# Patient Record
Sex: Female | Born: 1940 | Race: Black or African American | Hispanic: No | Smoking: Former smoker
Health system: Southern US, Community
[De-identification: ages and names within clinical notes are randomized; demographics above are authoritative.]

## PROBLEM LIST (undated history)

## (undated) DIAGNOSIS — G4730 Sleep apnea, unspecified: Secondary | ICD-10-CM

## (undated) DIAGNOSIS — E785 Hyperlipidemia, unspecified: Secondary | ICD-10-CM

## (undated) DIAGNOSIS — I214 Non-ST elevation (NSTEMI) myocardial infarction: Secondary | ICD-10-CM

## (undated) DIAGNOSIS — R0600 Dyspnea, unspecified: Secondary | ICD-10-CM

## (undated) DIAGNOSIS — I4891 Unspecified atrial fibrillation: Secondary | ICD-10-CM

## (undated) DIAGNOSIS — T8859XA Other complications of anesthesia, initial encounter: Secondary | ICD-10-CM

## (undated) DIAGNOSIS — T4145XA Adverse effect of unspecified anesthetic, initial encounter: Secondary | ICD-10-CM

## (undated) DIAGNOSIS — I2510 Atherosclerotic heart disease of native coronary artery without angina pectoris: Secondary | ICD-10-CM

## (undated) DIAGNOSIS — R011 Cardiac murmur, unspecified: Secondary | ICD-10-CM

## (undated) DIAGNOSIS — T7840XA Allergy, unspecified, initial encounter: Secondary | ICD-10-CM

## (undated) DIAGNOSIS — M1990 Unspecified osteoarthritis, unspecified site: Secondary | ICD-10-CM

## (undated) DIAGNOSIS — K635 Polyp of colon: Secondary | ICD-10-CM

## (undated) DIAGNOSIS — Z9289 Personal history of other medical treatment: Secondary | ICD-10-CM

## (undated) DIAGNOSIS — Z9889 Other specified postprocedural states: Secondary | ICD-10-CM

## (undated) DIAGNOSIS — R112 Nausea with vomiting, unspecified: Secondary | ICD-10-CM

## (undated) DIAGNOSIS — J189 Pneumonia, unspecified organism: Secondary | ICD-10-CM

## (undated) DIAGNOSIS — I499 Cardiac arrhythmia, unspecified: Secondary | ICD-10-CM

## (undated) DIAGNOSIS — I10 Essential (primary) hypertension: Secondary | ICD-10-CM

## (undated) DIAGNOSIS — Z87442 Personal history of urinary calculi: Secondary | ICD-10-CM

## (undated) DIAGNOSIS — K219 Gastro-esophageal reflux disease without esophagitis: Secondary | ICD-10-CM

## (undated) DIAGNOSIS — I509 Heart failure, unspecified: Secondary | ICD-10-CM

## (undated) DIAGNOSIS — Z950 Presence of cardiac pacemaker: Secondary | ICD-10-CM

## (undated) HISTORY — DX: Essential (primary) hypertension: I10

## (undated) HISTORY — DX: Unspecified osteoarthritis, unspecified site: M19.90

## (undated) HISTORY — DX: Allergy, unspecified, initial encounter: T78.40XA

## (undated) HISTORY — DX: Polyp of colon: K63.5

## (undated) HISTORY — PX: COLONOSCOPY: SHX174

## (undated) HISTORY — PX: EYE SURGERY: SHX253

## (undated) HISTORY — PX: CARDIAC CATHETERIZATION: SHX172

## (undated) HISTORY — DX: Hyperlipidemia, unspecified: E78.5

## (undated) HISTORY — DX: Atherosclerotic heart disease of native coronary artery without angina pectoris: I25.10

## (undated) HISTORY — DX: Personal history of other medical treatment: Z92.89

---
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
## 1969-11-04 HISTORY — PX: VAGINAL HYSTERECTOMY: SUR661

## 1998-11-04 HISTORY — PX: OTHER SURGICAL HISTORY: SHX169

## 1998-11-04 HISTORY — PX: CORONARY STENT PLACEMENT: SHX1402

## 1998-12-27 ENCOUNTER — Other Ambulatory Visit: Admission: RE | Admit: 1998-12-27 | Discharge: 1998-12-27 | Payer: Self-pay | Admitting: Obstetrics and Gynecology

## 1999-09-07 ENCOUNTER — Inpatient Hospital Stay (HOSPITAL_COMMUNITY): Admission: AD | Admit: 1999-09-07 | Discharge: 1999-09-08 | Payer: Self-pay | Admitting: Cardiovascular Disease

## 2002-07-28 ENCOUNTER — Other Ambulatory Visit: Admission: RE | Admit: 2002-07-28 | Discharge: 2002-07-28 | Payer: Self-pay | Admitting: Obstetrics and Gynecology

## 2004-12-18 ENCOUNTER — Ambulatory Visit: Payer: Self-pay | Admitting: Family Medicine

## 2005-05-10 ENCOUNTER — Ambulatory Visit: Payer: Self-pay | Admitting: Family Medicine

## 2005-07-26 ENCOUNTER — Ambulatory Visit: Payer: Self-pay | Admitting: Family Medicine

## 2005-09-17 ENCOUNTER — Ambulatory Visit: Payer: Self-pay | Admitting: Family Medicine

## 2005-12-17 ENCOUNTER — Ambulatory Visit: Payer: Self-pay | Admitting: Family Medicine

## 2007-04-06 ENCOUNTER — Encounter: Payer: Self-pay | Admitting: Family Medicine

## 2007-04-06 ENCOUNTER — Ambulatory Visit: Payer: Self-pay | Admitting: Family Medicine

## 2007-04-06 DIAGNOSIS — E785 Hyperlipidemia, unspecified: Secondary | ICD-10-CM | POA: Insufficient documentation

## 2007-04-06 DIAGNOSIS — I2510 Atherosclerotic heart disease of native coronary artery without angina pectoris: Secondary | ICD-10-CM | POA: Insufficient documentation

## 2007-04-06 DIAGNOSIS — I10 Essential (primary) hypertension: Secondary | ICD-10-CM | POA: Insufficient documentation

## 2007-04-06 DIAGNOSIS — Z860100 Personal history of colon polyps, unspecified: Secondary | ICD-10-CM | POA: Insufficient documentation

## 2007-04-06 DIAGNOSIS — J309 Allergic rhinitis, unspecified: Secondary | ICD-10-CM | POA: Insufficient documentation

## 2007-08-06 ENCOUNTER — Encounter: Payer: Self-pay | Admitting: Family Medicine

## 2007-11-11 ENCOUNTER — Encounter: Payer: Self-pay | Admitting: Family Medicine

## 2008-05-11 ENCOUNTER — Telehealth: Payer: Self-pay | Admitting: Family Medicine

## 2008-05-24 ENCOUNTER — Encounter: Payer: Self-pay | Admitting: Family Medicine

## 2008-05-31 ENCOUNTER — Ambulatory Visit: Payer: Self-pay | Admitting: Family Medicine

## 2008-05-31 DIAGNOSIS — M1990 Unspecified osteoarthritis, unspecified site: Secondary | ICD-10-CM | POA: Insufficient documentation

## 2008-05-31 DIAGNOSIS — M76899 Other specified enthesopathies of unspecified lower limb, excluding foot: Secondary | ICD-10-CM | POA: Insufficient documentation

## 2008-08-09 ENCOUNTER — Encounter: Payer: Self-pay | Admitting: Family Medicine

## 2008-08-31 ENCOUNTER — Ambulatory Visit: Payer: Self-pay | Admitting: Family Medicine

## 2008-08-31 DIAGNOSIS — M26629 Arthralgia of temporomandibular joint, unspecified side: Secondary | ICD-10-CM | POA: Insufficient documentation

## 2008-10-05 ENCOUNTER — Encounter: Payer: Self-pay | Admitting: Family Medicine

## 2008-10-14 ENCOUNTER — Encounter: Payer: Self-pay | Admitting: Family Medicine

## 2008-12-28 ENCOUNTER — Encounter: Payer: Self-pay | Admitting: Family Medicine

## 2009-01-21 ENCOUNTER — Ambulatory Visit: Payer: Self-pay | Admitting: Family Medicine

## 2009-01-21 DIAGNOSIS — R42 Dizziness and giddiness: Secondary | ICD-10-CM | POA: Insufficient documentation

## 2009-04-12 ENCOUNTER — Encounter: Payer: Self-pay | Admitting: Family Medicine

## 2009-08-10 ENCOUNTER — Encounter: Payer: Self-pay | Admitting: Family Medicine

## 2009-10-10 ENCOUNTER — Ambulatory Visit: Payer: Self-pay | Admitting: Family Medicine

## 2009-10-16 LAB — CONVERTED CEMR LAB
ALT: 20 units/L (ref 0–35)
AST: 27 units/L (ref 0–37)
Albumin: 4.2 g/dL (ref 3.5–5.2)
Alkaline Phosphatase: 79 units/L (ref 39–117)
BUN: 11 mg/dL (ref 6–23)
Basophils Absolute: 0 10*3/uL (ref 0.0–0.1)
Basophils Relative: 0.5 % (ref 0.0–3.0)
Bilirubin, Direct: 0.1 mg/dL (ref 0.0–0.3)
CO2: 34 meq/L — ABNORMAL HIGH (ref 19–32)
Calcium: 9.9 mg/dL (ref 8.4–10.5)
Chloride: 100 meq/L (ref 96–112)
Cholesterol: 125 mg/dL (ref 0–200)
Creatinine, Ser: 0.6 mg/dL (ref 0.4–1.2)
Eosinophils Absolute: 0.2 10*3/uL (ref 0.0–0.7)
Eosinophils Relative: 4.1 % (ref 0.0–5.0)
GFR calc non Af Amer: 126.89 mL/min (ref >60)
Glucose, Bld: 71 mg/dL (ref 70–99)
HCT: 43.8 % (ref 36.0–46.0)
HDL: 51.3 mg/dL (ref >39.00)
Hemoglobin: 14.3 g/dL (ref 12.0–15.0)
LDL Cholesterol: 59 mg/dL (ref 0–99)
Lymphocytes Relative: 31 % (ref 12.0–46.0)
Lymphs Abs: 1.6 10*3/uL (ref 0.7–4.0)
MCHC: 32.7 g/dL (ref 30.0–36.0)
MCV: 90.8 fL (ref 78.0–100.0)
Monocytes Absolute: 0.4 10*3/uL (ref 0.1–1.0)
Monocytes Relative: 7.7 % (ref 3.0–12.0)
Neutro Abs: 3 10*3/uL (ref 1.4–7.7)
Neutrophils Relative %: 56.7 % (ref 43.0–77.0)
Platelets: 173 10*3/uL (ref 150.0–400.0)
Potassium: 4.1 meq/L (ref 3.5–5.1)
RBC: 4.83 M/uL (ref 3.87–5.11)
RDW: 12 % (ref 11.5–14.6)
Sodium: 142 meq/L (ref 135–145)
TSH: 1.42 microintl units/mL (ref 0.35–5.50)
Total Bilirubin: 0.8 mg/dL (ref 0.3–1.2)
Total CHOL/HDL Ratio: 2
Total Protein: 7.8 g/dL (ref 6.0–8.3)
Triglycerides: 76 mg/dL (ref 0.0–149.0)
VLDL: 15.2 mg/dL (ref 0.0–40.0)
WBC: 5.2 10*3/uL (ref 4.5–10.5)

## 2010-05-03 ENCOUNTER — Ambulatory Visit: Payer: Self-pay | Admitting: Internal Medicine

## 2010-05-03 DIAGNOSIS — M545 Low back pain, unspecified: Secondary | ICD-10-CM | POA: Insufficient documentation

## 2010-08-07 ENCOUNTER — Encounter: Payer: Self-pay | Admitting: Family Medicine

## 2010-08-13 ENCOUNTER — Encounter: Payer: Self-pay | Admitting: Family Medicine

## 2010-10-17 ENCOUNTER — Ambulatory Visit: Payer: Self-pay | Admitting: Family Medicine

## 2010-10-17 LAB — CONVERTED CEMR LAB
Bilirubin Urine: NEGATIVE
Blood in Urine, dipstick: NEGATIVE
Glucose, Urine, Semiquant: NEGATIVE
Ketones, urine, test strip: NEGATIVE
Nitrite: NEGATIVE
Protein, U semiquant: NEGATIVE
Specific Gravity, Urine: 1.015
Urobilinogen, UA: 0.2
pH: 5

## 2010-10-19 ENCOUNTER — Telehealth (INDEPENDENT_AMBULATORY_CARE_PROVIDER_SITE_OTHER): Payer: Self-pay | Admitting: *Deleted

## 2010-10-22 LAB — CONVERTED CEMR LAB
ALT: 19 units/L (ref 0–35)
AST: 25 units/L (ref 0–37)
Albumin: 4.1 g/dL (ref 3.5–5.2)
Alkaline Phosphatase: 71 units/L (ref 39–117)
BUN: 15 mg/dL (ref 6–23)
Basophils Absolute: 0 10*3/uL (ref 0.0–0.1)
Basophils Relative: 0.5 % (ref 0.0–3.0)
Bilirubin, Direct: 0.1 mg/dL (ref 0.0–0.3)
CO2: 31 meq/L (ref 19–32)
Calcium: 9.9 mg/dL (ref 8.4–10.5)
Chloride: 102 meq/L (ref 96–112)
Cholesterol: 115 mg/dL (ref 0–200)
Creatinine, Ser: 0.6 mg/dL (ref 0.4–1.2)
Eosinophils Absolute: 0.2 10*3/uL (ref 0.0–0.7)
Eosinophils Relative: 3.4 % (ref 0.0–5.0)
GFR calc non Af Amer: 119.6 mL/min (ref >60.00)
Glucose, Bld: 84 mg/dL (ref 70–99)
HCT: 41.5 % (ref 36.0–46.0)
HDL: 42.4 mg/dL (ref >39.00)
Hemoglobin: 14.2 g/dL (ref 12.0–15.0)
LDL Cholesterol: 58 mg/dL (ref 0–99)
Lymphocytes Relative: 31 % (ref 12.0–46.0)
Lymphs Abs: 1.6 10*3/uL (ref 0.7–4.0)
MCHC: 34.2 g/dL (ref 30.0–36.0)
MCV: 88.8 fL (ref 78.0–100.0)
Monocytes Absolute: 0.4 10*3/uL (ref 0.1–1.0)
Monocytes Relative: 7.9 % (ref 3.0–12.0)
Neutro Abs: 3 10*3/uL (ref 1.4–7.7)
Neutrophils Relative %: 57.2 % (ref 43.0–77.0)
Platelets: 199 10*3/uL (ref 150.0–400.0)
Potassium: 3.8 meq/L (ref 3.5–5.1)
RBC: 4.67 M/uL (ref 3.87–5.11)
RDW: 12.9 % (ref 11.5–14.6)
Sodium: 140 meq/L (ref 135–145)
TSH: 1.27 microintl units/mL (ref 0.35–5.50)
Total Bilirubin: 0.6 mg/dL (ref 0.3–1.2)
Total CHOL/HDL Ratio: 3
Total Protein: 7 g/dL (ref 6.0–8.3)
Triglycerides: 75 mg/dL (ref 0.0–149.0)
VLDL: 15 mg/dL (ref 0.0–40.0)
WBC: 5.3 10*3/uL (ref 4.5–10.5)

## 2010-10-26 ENCOUNTER — Telehealth: Payer: Self-pay | Admitting: Family Medicine

## 2010-12-04 NOTE — Letter (Signed)
Summary: [facility]   [facility]   Imported By: Maryln Gottron 04/24/2009 13:15:49  _____________________________________________________________________  External Attachment:    Type:   Image     Comment:   External Document

## 2010-12-04 NOTE — Assessment & Plan Note (Signed)
Summary: SEVERE LOWER BACK PAIN/AND HIPS/CJR   Vital Signs:  Patient profile:   70 year old female Temp:     98.8 degrees F oral BP sitting:   130 / 68  (left arm) Cuff size:   large  Vitals Entered By: Sid Falcon LPN (May 03, 2010 3:46 PM) CC: Severe low back pain, Back Pain Pain Assessment Patient in pain? yes     Location: lower back Intensity: 7 Type: sharp   History of Present Illness: 7/10 severity       This is a 70 year old woman who presents with Back Pain.  onset last Friday.  The patient denies fever, chills, weakness, loss of sensation, fecal incontinence, and urinary incontinence.  The pain is located in the mid low back.  The pain began at home and gradually.  The pain is made worse by flexion.  The pain is made better by acetaminophen.  Risk factors for serious underlying conditions include age >= 50 years.         The pain is made better by heat.  No reported injury.  Allergies: 1)  ! Sulfa 2)  ! Morphine 3)  ! * All Caines  Past History:  Past Medical History: Last updated: 10/10/2009 Coronary artery disease, sees Dr.Thomas  Kelly Hypertension Allergic rhinitis Osteoarthritis Colonic polyps, hx of ECHO 10-2008 showed significant LVH with normal systolic function but has diastolic dysfunction normal stress test 10-2008  Review of Systems      See HPI  Physical Exam  General:  Well-developed,well-nourished,in no acute distress; alert,appropriate and cooperative throughout examination Neck:  No deformities, masses, or tenderness noted. Lungs:  Normal respiratory effort, chest expands symmetrically. Lungs are clear to auscultation, no crackles or wheezes. Heart:  normal rate and regular rhythm.   Msk:  SLRs neg.  No spinal tenderness. Extremities:  no edema. Neurologic:  strength normal in all extremities, sensation intact to light touch, and DTRs symmetrical and normal.     Impression & Recommendations:  Problem # 1:  LUMBAGO  (ICD-724.2) nonfocal neuro exam.  Trial of NSAID with possible side effects discussed with pt. Her updated medication list for this problem includes:    Adult Aspirin Low Strength 81 Mg Tbdp (Aspirin) .Marland Kitchen... 2 by mouth once daily    Meloxicam 15 Mg Tabs (Meloxicam) ..... One by mouth once daily  Complete Medication List: 1)  Nasacort Aq 55 Mcg/act Aers (Triamcinolone acetonide(nasal)) .Marland Kitchen.. 1-2 spr once daily 2)  Amlodipine Besylate 10 Mg Tabs (Amlodipine besylate) .... Once daily 3)  Adult Aspirin Low Strength 81 Mg Tbdp (Aspirin) .... 2 by mouth once daily 4)  Crestor 10 Mg Tabs (Rosuvastatin calcium) .Marland Kitchen.. 1 tablet by mouth daily 5)  Hyzaar 100-25 Mg Tabs (Losartan potassium-hctz) .... Take 1 tab by mouth daily 6)  Furosemide 20 Mg Tabs (Furosemide) .... Take 1 tab by mouth every day 7)  Multivitamins Tabs (Multiple vitamin) .Marland Kitchen.. 1 by mouth once daily 8)  Meloxicam 15 Mg Tabs (Meloxicam) .... One by mouth once daily  Patient Instructions: 1)  Most patients (90%) with low back pain will improve with time ( 2-6 weeks). Keep active but avoid activities that are painful. Apply moist heat and/or ice to lower back several times a day.  Prescriptions: MELOXICAM 15 MG TABS (MELOXICAM) one by mouth once daily  #30 x 0   Entered and Authorized by:   Evelena Peat MD   Signed by:   Evelena Peat MD on 05/03/2010   Method used:  Electronically to        [facility]  [phone]* (retail)       [address]       [place], [place]  [address]       Ph: [phone] or [phone]       Fax: [phone]   RxID:   [id number]

## 2010-12-04 NOTE — Letter (Signed)
Summary: [facility]  [facility]   Imported By: Maryln Gottron 01/04/2009 14:12:02  _____________________________________________________________________  External Attachment:    Type:   Image     Comment:   External Document

## 2010-12-04 NOTE — Progress Notes (Signed)
Summary: RF  Phone Note Call from Patient Call back at Home Phone [phone]   Caller: Patient-live call Call For: dr Nakeesha Bowler Reason for Call: Refill Medication Summary of Call: RF NASACORT AQ AT [facility] [address]. PLEASE CALL PT WHEN READY. Initial call taken by: Warnell Forester,  May 11, 2008 12:40 PM  Follow-up for Phone Call        Phone Call Completed, Rx Called In, tol pt to make ov soon Follow-up by: Alfred Levins, CMA,  May 11, 2008 1:05 PM    New/Updated Medications: NASACORT AQ 55 MCG/ACT AERS (TRIAMCINOLONE ACETONIDE(NASAL)) 1-2 spr once daily   Prescriptions: NASACORT AQ 55 MCG/ACT AERS (TRIAMCINOLONE ACETONIDE(NASAL)) 1-2 spr once daily  #30 days x 1   Entered by:   Alfred Levins, CMA   Authorized by:   [name] MD   Signed by:   Alfred Levins, CMA on 05/11/2008   Method used:   Electronically sent to ...       [facility]  [address]  [phone]*       [address]       [place], [place]  [address]       Ph: [phone] or [phone]       Fax: [phone]   RxID:   [id number]

## 2010-12-06 NOTE — Assessment & Plan Note (Signed)
Summary: CPX (PT WILL COME IN FASTING) // RS   Vital Signs:  Patient profile:   70 year old female Height:      67 inches Weight:      171 pounds BMI:     26.88 Temp:     98.5 degrees F oral Pulse rate:   60 / minute Pulse rhythm:   regular Resp:     12 per minute BP sitting:   158 / 80  Vitals Entered By: Lynann Beaver CMA AAMA (October 17, 2010 9:08 AM) CC: cpx Is Patient Diabetic? No Pain Assessment Patient in pain? no        History of Present Illness: 70 yr old female for a cpx. She feels good in general although she has had an intermittent sharp pain in the right groin for several weeks. Meloxicam helps it. Otherwise no SOB or chest pain. She had a normal stress test with Dr. Tresa Endo in October. Had an US of the aorta and femoral arteries last week, but she has not heard back from this yet.   Current Medications (verified): 1)  Nasacort Aq 55 Mcg/act Aers (Triamcinolone Acetonide(Nasal)) .Marland Kitchen.. 1-2 Spr Once Daily 2)  Amlodipine Besylate 10 Mg  Tabs (Amlodipine Besylate) .... Once Daily 3)  Adult Aspirin Low Strength 81 Mg Tbdp (Aspirin) .... 2 By Mouth Once Daily 4)  Crestor 10 Mg Tabs (Rosuvastatin Calcium) .Marland Kitchen.. 1 Tablet By Mouth Daily 5)  Hyzaar 100-25 Mg Tabs (Losartan Potassium-Hctz) .... Take 1 Tab By Mouth Daily 6)  Furosemide 20 Mg Tabs (Furosemide) .... Take 1 Tab By Mouth Every Day 7)  Multivitamins   Tabs (Multiple Vitamin) .Marland Kitchen.. 1 By Mouth Once Daily 8)  Meloxicam 15 Mg Tabs (Meloxicam) .... One By Mouth Once Daily  Allergies (verified): 1)  ! Sulfa 2)  ! Morphine 3)  ! * All Caines  Past History:  Past Medical History: Coronary artery disease, sees Dr.Thomas  Kelly Hypertension Allergic rhinitis Osteoarthritis Colonic polyps, hx of ECHO 10-2008 showed significant LVH with normal systolic function but has diastolic dysfunction normal nuclear stress test 08-07-10  Past Surgical History: Reviewed history from 10/10/2009 and no changes  required. Hysterectomy 1972 Stent placed 11/00 in LAD Colonoscopy 04-28-09 per Dr. Dorena Cookey, normal, repeat in 5 years  Family History: Reviewed history from 05/31/2008 and no changes required. Family History of CAD Female 1st degree relative <50 Ulcerative colitis sister had colectomy complete  Social History: Reviewed history from 05/31/2008 and no changes required. Retired Married Former smoker Alcohol use-no  Review of Systems  The patient denies anorexia, fever, weight loss, weight gain, vision loss, decreased hearing, hoarseness, chest pain, syncope, dyspnea on exertion, peripheral edema, prolonged cough, headaches, hemoptysis, abdominal pain, melena, hematochezia, severe indigestion/heartburn, hematuria, incontinence, genital sores, muscle weakness, suspicious skin lesions, transient blindness, difficulty walking, depression, unusual weight change, abnormal bleeding, enlarged lymph nodes, angioedema, breast masses, and testicular masses.    Physical Exam  General:  Well-developed,well-nourished,in no acute distress; alert,appropriate and cooperative throughout examination Head:  Normocephalic and atraumatic without obvious abnormalities. No apparent alopecia or balding. Eyes:  No corneal or conjunctival inflammation noted. EOMI. Perrla. Funduscopic exam benign, without hemorrhages, exudates or papilledema. Vision grossly normal. Ears:  External ear exam shows no significant lesions or deformities.  Otoscopic examination reveals clear canals, tympanic membranes are intact bilaterally without bulging, retraction, inflammation or discharge. Hearing is grossly normal bilaterally. Nose:  External nasal examination shows no deformity or inflammation. Nasal mucosa are pink and moist without lesions  or exudates. Mouth:  Oral mucosa and oropharynx without lesions or exudates.  Teeth in good repair. Neck:  No deformities, masses, or tenderness noted. Chest Wall:  No deformities, masses, or  tenderness noted. Lungs:  Normal respiratory effort, chest expands symmetrically. Lungs are clear to auscultation, no crackles or wheezes. Heart:  normal rate, regular rhythm, no gallop, no rub, and no JVD.  Has her typical 2/6 SM at the left sternal border.  Abdomen:  Bowel sounds positive,abdomen soft and non-tender without masses, organomegaly or hernias noted. Msk:  No deformity or scoliosis noted of thoracic or lumbar spine.  Mildly tender over the pelvic insertion of the right hip flexors  Pulses:  R and L carotid,radial,femoral,dorsalis pedis and posterior tibial pulses are full and equal bilaterally Extremities:  No clubbing, cyanosis, edema, or deformity noted with normal full range of motion of all joints.   Neurologic:  No cranial nerve deficits noted. Station and gait are normal. Plantar reflexes are down-going bilaterally. DTRs are symmetrical throughout. Sensory, motor and coordinative functions appear intact. Skin:  Intact without suspicious lesions or rashes Cervical Nodes:  No lymphadenopathy noted Axillary Nodes:  No palpable lymphadenopathy Inguinal Nodes:  No significant adenopathy Psych:  Cognition and judgment appear intact. Alert and cooperative with normal attention span and concentration. No apparent delusions, illusions, hallucinations   Impression & Recommendations:  Problem # 1:  OSTEOARTHRITIS (ICD-715.90)  Her updated medication list for this problem includes:    Adult Aspirin Low Strength 81 Mg Tbdp (Aspirin) .Marland Kitchen... 2 by mouth once daily    Meloxicam 15 Mg Tabs (Meloxicam) ..... One by mouth once daily  Problem # 2:  HYPERTENSION (ICD-401.9)  Her updated medication list for this problem includes:    Amlodipine Besylate 10 Mg Tabs (Amlodipine besylate) ..... Once daily    Hyzaar 100-25 Mg Tabs (Losartan potassium-hctz) .Marland Kitchen... Take 1 tab by mouth daily    Furosemide 20 Mg Tabs (Furosemide) .Marland Kitchen... Take 1 tab by mouth every day  Orders: UA Dipstick w/o Micro  (automated)  (81003) Venipuncture (04540) TLB-Lipid Panel (80061-LIPID) TLB-BMP (Basic Metabolic Panel-BMET) (80048-METABOL) TLB-CBC Platelet - w/Differential (85025-CBCD) TLB-Hepatic/Liver Function Pnl (80076-HEPATIC) TLB-TSH (Thyroid Stimulating Hormone) (84443-TSH)  Problem # 3:  HYPERLIPIDEMIA (ICD-272.4)  Her updated medication list for this problem includes:    Crestor 10 Mg Tabs (Rosuvastatin calcium) .Marland Kitchen... 1 tablet by mouth daily  Problem # 4:  CORONARY ARTERY DISEASE (ICD-414.00)  Her updated medication list for this problem includes:    Amlodipine Besylate 10 Mg Tabs (Amlodipine besylate) ..... Once daily    Adult Aspirin Low Strength 81 Mg Tbdp (Aspirin) .Marland Kitchen... 2 by mouth once daily    Hyzaar 100-25 Mg Tabs (Losartan potassium-hctz) .Marland Kitchen... Take 1 tab by mouth daily    Furosemide 20 Mg Tabs (Furosemide) .Marland Kitchen... Take 1 tab by mouth every day  Complete Medication List: 1)  Nasacort Aq 55 Mcg/act Aers (Triamcinolone acetonide(nasal)) .Marland Kitchen.. 1-2 spr once daily 2)  Amlodipine Besylate 10 Mg Tabs (Amlodipine besylate) .... Once daily 3)  Adult Aspirin Low Strength 81 Mg Tbdp (Aspirin) .... 2 by mouth once daily 4)  Crestor 10 Mg Tabs (Rosuvastatin calcium) .Marland Kitchen.. 1 tablet by mouth daily 5)  Hyzaar 100-25 Mg Tabs (Losartan potassium-hctz) .... Take 1 tab by mouth daily 6)  Furosemide 20 Mg Tabs (Furosemide) .... Take 1 tab by mouth every day 7)  Multivitamins Tabs (Multiple vitamin) .Marland Kitchen.. 1 by mouth once daily 8)  Meloxicam 15 Mg Tabs (Meloxicam) .... One by mouth once daily  Other Orders: Admin 1st Vaccine (24401) Flu Vaccine 60yrs + [phone])  Patient Instructions: 1)  get fasting labs  Prescriptions: MELOXICAM 15 MG TABS (MELOXICAM) one by mouth once daily  #30 x 11   Entered and Authorized by:   [name] MD   Signed by:   [name] MD on 10/17/2010   Method used:   Electronically to        [facility]  [phone]* (retail)       [address]        [place], [place]  [address]       Ph: [phone] or [phone]       Fax: [phone]   RxID:   [id number]    Orders Added: 1)  Est. Patient Level IV [42706] 2)  UA Dipstick w/o Micro (automated)  [81003] 3)  Venipuncture [23762] 4)  TLB-Lipid Panel [80061-LIPID] 5)  TLB-BMP (Basic Metabolic Panel-BMET) [80048-METABOL] 6)  TLB-CBC Platelet - w/Differential [85025-CBCD] 7)  TLB-Hepatic/Liver Function Pnl [80076-HEPATIC] 8)  TLB-TSH (Thyroid Stimulating Hormone) [84443-TSH] 9)  Admin 1st Vaccine [90471] 10)  Flu Vaccine 71yrs + [83151] Flu Vaccine Consent Questions     Do you have a history of severe allergic reactions to this vaccine? no    Any prior history of allergic reactions to egg and/or gelatin? no    Do you have a sensitivity to the preservative Thimersol? no    Do you have a past history of Guillan-Barre Syndrome? no    Do you currently have an acute febrile illness? no    Have you ever had a severe reaction to latex? no    Vaccine information given and explained to patient? yes    Are you currently pregnant? no    Lot Number:[id number]   Exp Date:05/04/2011   Site Given  Left Deltoid IM 8)  TLB-TSH (Thyroid Stimulating Hormone) [84443-TSH] 9)  Admin 1st Vaccine [90471] 10)  Flu Vaccine 29yrs + [76160]     .lbflu  Laboratory Results   Urine Tests    Routine Urinalysis   Color: yellow Appearance: Clear Glucose: negative   (Normal Range: Negative) Bilirubin: negative   (Normal Range: Negative) Ketone: negative   (Normal Range: Negative) Spec. Gravity: 1.015   (Normal Range: 1.003-1.035) Blood: negative   (Normal Range: Negative) pH: 5.0   (Normal Range: 5.0-8.0) Protein: negative   (Normal Range: Negative) Urobilinogen: 0.2   (Normal Range: 0-1) Nitrite: negative   (Normal Range: Negative) Leukocyte Esterace: 1+   (Normal Range: Negative)    Comments: Rita Ohara  October 17, 2010 2:46 PM

## 2010-12-06 NOTE — Progress Notes (Signed)
Summary: refill [facility] meloxicam   Phone Note From Pharmacy   Caller: [facility] Summary of Call: refill  on meloxicam Initial call taken by: Pura Spice, RN,  October 26, 2010 9:50 AM  Follow-up for Phone Call        spoke with pt stated wants meloxicam thru [facility]  [facility] notified to d/c refills on this med and rx sent to [facility] Follow-up by: Pura Spice, RN,  October 26, 2010 9:51 AM    Prescriptions: MELOXICAM 15 MG TABS (MELOXICAM) one by mouth once daily  #90 x 3   Entered by:   Pura Spice, RN   Authorized by:   [name] MD   Signed by:   Pura Spice, RN on 10/26/2010   Method used:   Faxed to ...       [facility] (mail-order)             , [place]         Ph: [phone]       Fax: [phone]   RxID:   [id number]

## 2010-12-06 NOTE — Progress Notes (Signed)
Summary: rtc  Phone Note Call from Patient Call back at Home Phone [phone]   Caller: Patient Call For: [name] MD Summary of Call: pt is return deb call Initial call taken by: Heron Sabins,  October 19, 2010 4:43 PM  Follow-up for Phone Call        spoke with pt copy labs mailed to pt at her request.  Follow-up by: Pura Spice, RN,  October 22, 2010 8:34 AM

## 2010-12-21 ENCOUNTER — Encounter: Payer: Self-pay | Admitting: Family Medicine

## 2010-12-21 ENCOUNTER — Ambulatory Visit (INDEPENDENT_AMBULATORY_CARE_PROVIDER_SITE_OTHER): Payer: Medicare Other | Admitting: Family Medicine

## 2010-12-21 VITALS — BP 120/76 | HR 75 | Temp 99.0°F | Wt 170.0 lb

## 2010-12-21 DIAGNOSIS — L0291 Cutaneous abscess, unspecified: Secondary | ICD-10-CM

## 2010-12-21 DIAGNOSIS — L0390 Cellulitis, unspecified: Secondary | ICD-10-CM

## 2010-12-21 MED ORDER — CEFTRIAXONE SODIUM 500 MG IJ SOLR
500.0000 mg | INTRAMUSCULAR | Status: DC
  Administered 2010-12-21: 500 mg via INTRAMUSCULAR

## 2010-12-21 MED ORDER — CEPHALEXIN 500 MG PO CAPS: 500.0000 mg | ORAL_CAPSULE | Freq: Three times a day (TID) | ORAL | Status: AC

## 2010-12-21 NOTE — Progress Notes (Signed)
Addended by: Madison Hickman on: 12/21/2010 04:47 PM   Modules accepted: Orders

## 2010-12-21 NOTE — Progress Notes (Signed)
  Subjective:    Patient ID: Barbara [name], female    DOB: 1941-05-16, 70 y.o.   MRN: [id number]  HPI Here for one week of swelling and tenderness around the left thumb. No hx of trauma. This began around the nail edge, but has now spread up the thumb into the radial wrist area. No fever.    Review of Systems  Constitutional: Negative.   Musculoskeletal: Positive for joint swelling.       Objective:   Physical Exam  Constitutional: She appears well-developed and well-nourished.  Musculoskeletal:       The radial side of the left thumb at the nail edge is tender, red, and swollen. She is slightly swollen along the radial side of the thumb and the wrist also. Full ROM.          Assessment & Plan:  This started as an isolated paronychia but is turning into an early cellulitis. Will treat with a Rocephin shot and oral antibiotics. Continue hot Epsom salt soaks.

## 2011-08-22 ENCOUNTER — Encounter: Payer: Self-pay | Admitting: Family Medicine

## 2011-08-29 ENCOUNTER — Other Ambulatory Visit: Payer: Self-pay | Admitting: Family Medicine

## 2011-08-29 NOTE — Telephone Encounter (Signed)
Script sent e-scribe 

## 2011-10-25 ENCOUNTER — Ambulatory Visit (INDEPENDENT_AMBULATORY_CARE_PROVIDER_SITE_OTHER): Payer: Medicare Other | Admitting: Family Medicine

## 2011-10-25 DIAGNOSIS — Z23 Encounter for immunization: Secondary | ICD-10-CM

## 2011-12-05 ENCOUNTER — Encounter: Payer: Self-pay | Admitting: Family Medicine

## 2011-12-05 ENCOUNTER — Ambulatory Visit (INDEPENDENT_AMBULATORY_CARE_PROVIDER_SITE_OTHER): Payer: Medicare Other | Admitting: Family Medicine

## 2011-12-05 VITALS — BP 138/80 | HR 61 | Temp 98.5°F | Ht 67.5 in | Wt 163.0 lb

## 2011-12-05 DIAGNOSIS — E785 Hyperlipidemia, unspecified: Secondary | ICD-10-CM

## 2011-12-05 DIAGNOSIS — K635 Polyp of colon: Secondary | ICD-10-CM

## 2011-12-05 DIAGNOSIS — I2510 Atherosclerotic heart disease of native coronary artery without angina pectoris: Secondary | ICD-10-CM

## 2011-12-05 DIAGNOSIS — Z0000 Encounter for general adult medical examination without abnormal findings: Secondary | ICD-10-CM

## 2011-12-05 DIAGNOSIS — I10 Essential (primary) hypertension: Secondary | ICD-10-CM

## 2011-12-05 DIAGNOSIS — M810 Age-related osteoporosis without current pathological fracture: Secondary | ICD-10-CM

## 2011-12-05 DIAGNOSIS — D126 Benign neoplasm of colon, unspecified: Secondary | ICD-10-CM

## 2011-12-05 LAB — LIPID PANEL
Cholesterol: 146 mg/dL (ref 0–200)
HDL: 59.8 mg/dL (ref >39.00)
LDL Cholesterol: 69 mg/dL (ref 0–99)
Total CHOL/HDL Ratio: 2
Triglycerides: 84 mg/dL (ref 0.0–149.0)
VLDL: 16.8 mg/dL (ref 0.0–40.0)

## 2011-12-05 LAB — POCT URINALYSIS DIPSTICK
Bilirubin, UA: NEGATIVE
Blood, UA: NEGATIVE
Glucose, UA: NEGATIVE
Ketones, UA: NEGATIVE
Leukocytes, UA: NEGATIVE
Nitrite, UA: NEGATIVE
Protein, UA: NEGATIVE
Spec Grav, UA: 1.025
Urobilinogen, UA: 0.2
pH, UA: 5.5

## 2011-12-05 LAB — TSH: TSH: 1.11 u[IU]/mL (ref 0.35–5.50)

## 2011-12-05 LAB — CBC WITH DIFFERENTIAL/PLATELET
Basophils Absolute: 0 10*3/uL (ref 0.0–0.1)
Basophils Relative: 0.4 % (ref 0.0–3.0)
Eosinophils Absolute: 0.2 10*3/uL (ref 0.0–0.7)
Eosinophils Relative: 2.7 % (ref 0.0–5.0)
HCT: 43.5 % (ref 36.0–46.0)
Hemoglobin: 14.7 g/dL (ref 12.0–15.0)
Lymphocytes Relative: 29.5 % (ref 12.0–46.0)
Lymphs Abs: 1.8 10*3/uL (ref 0.7–4.0)
MCHC: 33.8 g/dL (ref 30.0–36.0)
MCV: 90.1 fl (ref 78.0–100.0)
Monocytes Absolute: 0.4 10*3/uL (ref 0.1–1.0)
Monocytes Relative: 6.1 % (ref 3.0–12.0)
Neutro Abs: 3.7 10*3/uL (ref 1.4–7.7)
Neutrophils Relative %: 61.3 % (ref 43.0–77.0)
Platelets: 208 10*3/uL (ref 150.0–400.0)
RBC: 4.83 Mil/uL (ref 3.87–5.11)
RDW: 12.7 % (ref 11.5–14.6)
WBC: 6.1 10*3/uL (ref 4.5–10.5)

## 2011-12-05 LAB — HEPATIC FUNCTION PANEL
ALT: 17 U/L (ref 0–35)
AST: 21 U/L (ref 0–37)
Albumin: 4.4 g/dL (ref 3.5–5.2)
Alkaline Phosphatase: 69 U/L (ref 39–117)
Bilirubin, Direct: 0.1 mg/dL (ref 0.0–0.3)
Total Bilirubin: 0.9 mg/dL (ref 0.3–1.2)
Total Protein: 7.6 g/dL (ref 6.0–8.3)

## 2011-12-05 LAB — BASIC METABOLIC PANEL
BUN: 18 mg/dL (ref 6–23)
CO2: 31 mEq/L (ref 19–32)
Calcium: 9.8 mg/dL (ref 8.4–10.5)
Chloride: 101 mEq/L (ref 96–112)
Creatinine, Ser: 0.6 mg/dL (ref 0.4–1.2)
GFR: 117.07 mL/min (ref >60.00)
Glucose, Bld: 63 mg/dL — ABNORMAL LOW (ref 70–99)
Potassium: 3.3 mEq/L — ABNORMAL LOW (ref 3.5–5.1)
Sodium: 143 mEq/L (ref 135–145)

## 2011-12-05 NOTE — Progress Notes (Signed)
  Subjective:    Patient ID: Barbara [name], female    DOB: 1941/07/18, 71 y.o.   MRN: [id number]  HPI 71 yr old female for a cpx. She feels fine and has no concerns. No chest pains or SOB. She stays active and watches her diet. She sees Dr. Tresa Endo once a year every summer.    Review of Systems  Constitutional: Negative.   HENT: Negative.   Eyes: Negative.   Respiratory: Negative.   Cardiovascular: Negative.   Gastrointestinal: Negative.   Genitourinary: Negative for dysuria, urgency, frequency, hematuria, flank pain, decreased urine volume, enuresis, difficulty urinating, pelvic pain and dyspareunia.  Musculoskeletal: Negative.   Skin: Negative.   Neurological: Negative.   Hematological: Negative.   Psychiatric/Behavioral: Negative.        Objective:   Physical Exam  Constitutional: She is oriented to person, place, and time. She appears well-developed and well-nourished. No distress.  HENT:  Head: Normocephalic and atraumatic.  Right Ear: External ear normal.  Left Ear: External ear normal.  Nose: Nose normal.  Mouth/Throat: Oropharynx is clear and moist. No oropharyngeal exudate.  Eyes: Conjunctivae and EOM are normal. Pupils are equal, round, and reactive to light. No scleral icterus.  Neck: Normal range of motion. Neck supple. No JVD present. No thyromegaly present.  Cardiovascular: Normal rate, regular rhythm, normal heart sounds and intact distal pulses.  Exam reveals no gallop and no friction rub.   No murmur heard.      EKG is at her baseline with LVH and T wave inversions in the inferior and lateral leads   Pulmonary/Chest: Effort normal and breath sounds normal. No respiratory distress. She has no wheezes. She has no rales. She exhibits no tenderness.  Abdominal: Soft. Bowel sounds are normal. She exhibits no distension and no mass. There is no tenderness. There is no rebound and no guarding.  Musculoskeletal: Normal range of motion. She exhibits no edema and  no tenderness.  Lymphadenopathy:    She has no cervical adenopathy.  Neurological: She is alert and oriented to person, place, and time. She has normal reflexes. No cranial nerve deficit. She exhibits normal muscle tone. Coordination normal.  Skin: Skin is warm and dry. No rash noted. No erythema.  Psychiatric: She has a normal mood and affect. Her behavior is normal. Judgment and thought content normal.          Assessment & Plan:  Well exam. Get fasting labs. Set up a DEXA.

## 2011-12-09 ENCOUNTER — Ambulatory Visit (INDEPENDENT_AMBULATORY_CARE_PROVIDER_SITE_OTHER)
Admission: RE | Admit: 2011-12-09 | Discharge: 2011-12-09 | Disposition: A | Discharge status: Home / Self Care | Payer: Medicare Other | Source: Ambulatory Visit

## 2011-12-09 DIAGNOSIS — M810 Age-related osteoporosis without current pathological fracture: Secondary | ICD-10-CM

## 2011-12-10 ENCOUNTER — Telehealth: Payer: Self-pay | Admitting: Family Medicine

## 2011-12-10 NOTE — Telephone Encounter (Signed)
I spoke with pt to go over lab results. Pt does have Klor-Con 20 meq but has not been taking it. She was told to hold on that and if she did have to take it, then take a Lasix with it. She has 4 bottles of the Klor-Con. Do you want her to start back on it & if so then can she cut in half? Also do you advise taking the Lasix when she takes the Klor-Con?

## 2011-12-10 NOTE — Telephone Encounter (Signed)
First, take the 10 mEq of potassium every day. Yes, she may take one half of a 20 every day. No, she should not be taking Lasix now since she does not need it

## 2011-12-10 NOTE — Progress Notes (Signed)
Quick Note:  Spoke with pt, see phone note for 12/10/11. Also pt requested a copy of labs mailed to her. ______

## 2011-12-11 NOTE — Telephone Encounter (Signed)
Spoke with pt

## 2011-12-16 ENCOUNTER — Other Ambulatory Visit: Payer: Self-pay | Admitting: Family Medicine

## 2011-12-16 MED ORDER — POTASSIUM CHLORIDE ER 10 MEQ PO TBCR: 10.0000 meq | EXTENDED_RELEASE_TABLET | Freq: Every day | ORAL | Status: DC

## 2011-12-16 NOTE — Telephone Encounter (Signed)
Pt need 14 day supply of potassium?mg call [facility] [address] and #90 [facility] supply [facility].

## 2011-12-16 NOTE — Telephone Encounter (Signed)
I sent in a local script for Klor Con 10 meq and also a 90 day supply to [facility], see lab result note. Also spoke with pt.

## 2011-12-25 ENCOUNTER — Encounter: Payer: Self-pay | Admitting: Family Medicine

## 2011-12-25 ENCOUNTER — Ambulatory Visit (INDEPENDENT_AMBULATORY_CARE_PROVIDER_SITE_OTHER): Payer: Medicare Other | Admitting: Family Medicine

## 2011-12-25 ENCOUNTER — Telehealth: Payer: Self-pay | Admitting: Family Medicine

## 2011-12-25 VITALS — BP 138/74 | HR 77 | Temp 98.4°F | Wt 163.0 lb

## 2011-12-25 DIAGNOSIS — M5430 Sciatica, unspecified side: Secondary | ICD-10-CM

## 2011-12-25 MED ORDER — DICLOFENAC SODIUM 75 MG PO TBEC: 75.0000 mg | DELAYED_RELEASE_TABLET | Freq: Two times a day (BID) | ORAL | Status: DC | PRN

## 2011-12-25 NOTE — Telephone Encounter (Signed)
Left voice message for pt to call and schedule office visit.

## 2011-12-25 NOTE — Telephone Encounter (Signed)
The potassium would not cause her leg to be stiff. This needs to be evaluated, so make her an OV with me soon

## 2011-12-25 NOTE — Telephone Encounter (Signed)
Patient walked in stating that she is having side effects from the potassium she is taking, such as, left leg is tight, painful and stiff. Patient is also having gas and would like to know how long she is to take potassium and if she can substitute for them. Please advise and inform patient of advice.

## 2011-12-25 NOTE — Progress Notes (Signed)
  Subjective:    Patient ID: Barbara [name], female    DOB: 06/10/1941, 71 y.o.   MRN: [id number]  HPI Here for 2 weeks of pains that start in the left hip and run down the left leg to the mid calf. No numbness or weakness. No back pain. No recent trauma or falls.    Review of Systems  Constitutional: Negative.   Musculoskeletal: Positive for arthralgias.       Objective:   Physical Exam  Constitutional: She appears well-developed and well-nourished.  Cardiovascular: Intact distal pulses.   Musculoskeletal: She exhibits no edema.       Tender in the left sciatic notch and the left buttock. The spine and hips have full ROM.           Assessment & Plan:  Rest, heat, Diclofenac prn.

## 2011-12-31 ENCOUNTER — Encounter: Payer: Self-pay | Admitting: Family Medicine

## 2012-01-01 NOTE — Progress Notes (Signed)
Quick Note:  Spoke with pt ______ 

## 2012-04-17 ENCOUNTER — Telehealth: Payer: Self-pay | Admitting: Family Medicine

## 2012-04-17 NOTE — Telephone Encounter (Signed)
I spoke with pt and she is going to schedule to come in on 04/20/12.

## 2012-04-17 NOTE — Telephone Encounter (Signed)
Make an OV for next week  

## 2012-04-17 NOTE — Telephone Encounter (Signed)
Patient came in stating that since she has been on potassium her ankles have been swollen. Please advise.

## 2012-04-20 ENCOUNTER — Encounter: Payer: Self-pay | Admitting: Family Medicine

## 2012-04-20 ENCOUNTER — Ambulatory Visit (INDEPENDENT_AMBULATORY_CARE_PROVIDER_SITE_OTHER): Payer: Medicare Other | Admitting: Family Medicine

## 2012-04-20 VITALS — BP 138/62 | HR 59 | Temp 98.5°F | Wt 163.0 lb

## 2012-04-20 DIAGNOSIS — R609 Edema, unspecified: Secondary | ICD-10-CM

## 2012-04-20 DIAGNOSIS — R600 Localized edema: Secondary | ICD-10-CM

## 2012-04-20 DIAGNOSIS — I10 Essential (primary) hypertension: Secondary | ICD-10-CM

## 2012-04-20 MED ORDER — FUROSEMIDE 20 MG PO TABS: 20.0000 mg | ORAL_TABLET | Freq: Every day | ORAL | Status: DC

## 2012-04-20 NOTE — Progress Notes (Signed)
  Subjective:    Patient ID: Barbara [name], female    DOB: 03/14/1941, 71 y.o.   MRN: [id number]  HPI Here for several weeks of swelling at the feet and ankles. No discomfort involved. No SOB. She feels fine otherwise.    Review of Systems  Constitutional: Negative.   Respiratory: Negative.   Cardiovascular: Positive for leg swelling. Negative for chest pain and palpitations.       Objective:   Physical Exam  Constitutional: She appears well-developed and well-nourished.  Cardiovascular: Normal rate, regular rhythm and intact distal pulses.  Exam reveals no gallop and no friction rub.        She has her usual 2/6 SM over the RSB   Musculoskeletal:       1+ edema in both feet, not tender           Assessment & Plan:  Her weight has not changed since February. I think part of this may be side effects of her Amlodipine, probably exacerbated by the recent hot weather. Get back on Lasix every morning.

## 2012-08-11 ENCOUNTER — Telehealth: Payer: Self-pay | Admitting: Family Medicine

## 2012-08-11 MED ORDER — FUROSEMIDE 20 MG PO TABS: 20.0000 mg | ORAL_TABLET | Freq: Every day | ORAL | Status: DC

## 2012-08-11 NOTE — Telephone Encounter (Signed)
Refill request for Lasix 20 mg 90 day supply to [facility]. I did sent e-scribe.

## 2012-08-24 ENCOUNTER — Encounter: Payer: Self-pay | Admitting: Family Medicine

## 2012-09-17 ENCOUNTER — Other Ambulatory Visit (HOSPITAL_COMMUNITY): Payer: Self-pay

## 2012-09-17 ENCOUNTER — Other Ambulatory Visit (HOSPITAL_COMMUNITY): Payer: Self-pay | Admitting: *Deleted

## 2012-09-17 DIAGNOSIS — I2510 Atherosclerotic heart disease of native coronary artery without angina pectoris: Secondary | ICD-10-CM

## 2012-09-17 DIAGNOSIS — I4891 Unspecified atrial fibrillation: Secondary | ICD-10-CM

## 2012-09-23 ENCOUNTER — Ambulatory Visit (HOSPITAL_COMMUNITY)
Admission: RE | Admit: 2012-09-23 | Discharge: 2012-09-23 | Disposition: A | Discharge status: Home / Self Care | Payer: Medicare Other | Source: Ambulatory Visit | Attending: Cardiovascular Disease | Admitting: Cardiovascular Disease

## 2012-09-23 DIAGNOSIS — R0602 Shortness of breath: Secondary | ICD-10-CM | POA: Insufficient documentation

## 2012-09-23 DIAGNOSIS — I2510 Atherosclerotic heart disease of native coronary artery without angina pectoris: Secondary | ICD-10-CM

## 2012-09-23 DIAGNOSIS — R9431 Abnormal electrocardiogram [ECG] [EKG]: Secondary | ICD-10-CM | POA: Insufficient documentation

## 2012-09-23 DIAGNOSIS — I10 Essential (primary) hypertension: Secondary | ICD-10-CM | POA: Insufficient documentation

## 2012-09-23 DIAGNOSIS — I4891 Unspecified atrial fibrillation: Secondary | ICD-10-CM | POA: Insufficient documentation

## 2012-09-23 MED ORDER — TECHNETIUM TC 99M SESTAMIBI GENERIC - CARDIOLITE
10.2000 | Freq: Once | INTRAVENOUS | Status: AC | PRN
  Administered 2012-09-23: 10 via INTRAVENOUS

## 2012-09-23 MED ORDER — TECHNETIUM TC 99M SESTAMIBI GENERIC - CARDIOLITE
30.9000 | Freq: Once | INTRAVENOUS | Status: AC | PRN
  Administered 2012-09-23: 30.9 via INTRAVENOUS

## 2012-09-23 MED ORDER — AMINOPHYLLINE 25 MG/ML IV SOLN
75.0000 mg | Freq: Once | INTRAVENOUS | Status: AC
  Administered 2012-09-23: 75 mg via INTRAVENOUS

## 2012-09-23 MED ORDER — REGADENOSON 0.4 MG/5ML IV SOLN
0.4000 mg | Freq: Once | INTRAVENOUS | Status: AC
  Administered 2012-09-23: 0.4 mg via INTRAVENOUS

## 2012-09-23 NOTE — Progress Notes (Signed)
[facility]   2D echo completed 09/23/2012.   Cindy Juddson Cobern, RDCS   

## 2012-09-23 NOTE — CV Procedure (Signed)
[place]  [facility] [address]  [address]  [place] [address]  [phone]  Cardiology Nuclear Med Study  Barbara [name] is a 71 y.o. female MRN : [id number] DOB: 03-Aug-1941  Procedure Date: 09/23/2012  Nuclear Med Background  Indication for Stress Test: Evaluation for Ischemia, Stent Patency and Abnormal EKG  History: PTCA/Stent 2000  Cardiac Risk Factors: Hypertension, Lipids and Obesity  Symptoms: SOB  Nuclear Pre-Procedure  Caffeine/Decaff Intake: None  NPO After: 6:00am   Lungs: N/A  O2 Sat: N/A% on N/A.  IV 0.9% NS with Angio Cath: 22g   IV Site: R Hand  IV Started by: Bonnita Levan, RN   Chest Size (in): 42  Cup Size: C   Height: 5\' 7"  (1.702 m)  Weight: 163 lb (73.936 kg)   BMI: Body mass index is 25.53 kg/(m^2).  Tech Comments: 75 mg aminophylline iv post Lexiscan injection   Nuclear Med Study  1 or 2 day study: 1 day  Stress Test Type: Lexiscan   Order Authorizing Provider:  Daphene Jaeger, MD    Resting Radionuclide: Technetium 107m Sestamibi  Resting Radionuclide Dose: 10.2 mCi   Stress Radionuclide: Technetium 98m Sestamibi  Stress Radionuclide Dose: 30.9 mCi   Stress Protocol  Rest HR:47  Stress HR:71   Rest BP: 180/84  Stress BP: 187/84   Exercise Time (min): n/a  METS: n/a   Predicted Max HR: 147 bpm  % Max HR: 48.3 bpm  Rate Pressure Product: 21308  Dose of Adenosine (mg): n/a  Dose of Lexiscan: 0.4 mg   Dose of Atropine (mg): n/a  Dose of Dobutamine: n/a mcg/kg/min (at max HR)   Stress Test Technologist: Ernestene Mention, CCT  Nuclear Technologist: Gonzella Lex, CNMT    Rest Procedure: Myocardial perfusion imaging was performed at rest 45 minutes following the intravenous administration of Technetium 4m Sestamibi.  Stress Procedure: The patient received IV Lexiscan 0.4 mg over 15-seconds. Technetium 38m Sestamibi injected at 30-seconds. There were no significant changes with Lexiscan. Quantitative spect images  were obtained after a 45 minute delay.  Transient Ischemic Dilatation (Normal <1.22): 1.13  Lung/Heart Ratio (Normal <0.45): 0.24  QGS EDV: 77 ml  QGS ESV: 25 ml  LV Ejection Fraction: 68%   MD INTERPRETATION:  Rest ECG: Sinus Bradycardia, LVH with repolarization abnormality - Anterolateral TWI  Stress ECG: No significant change from baseline ECG  QPS Raw Data Images:  Normal LV size; increased tracer uptake in the Left Hepatic lobe obscures the inferoseptal wall -- noted in rest > stress images Stress Images:  Normal homogeneous uptake in all areas of the myocardium. Rest Images:  Comparison with the stress images reveals no significant change. Subtraction (SDS):  No reversibility is appreciated.  Impression Exercise Capacity:  Lexiscan with no exercise. BP Response:  Normal blood pressure response. Clinical Symptoms:  There is dyspnea. ECG Impression:  No significant ECG changes with Lexiscan. Comparison with Prior Nuclear Study: No significant change from previous study  Overall Impression:  Low risk stress nuclear study.  No evidence of ischemia or prior infarction.  LV Wall Motion:  NL LV Function; NL Wall Motion   HARDING,DAVID W, MD  09/23/2012 5:49 PM

## 2012-09-23 NOTE — Procedures (Signed)
[facility] [address] [place] [address] [phone]  Cardiology Nuclear Med Study  Barbara [name] is a 71 y.o. female     MRN : [id number]     DOB: [birth year]  Procedure Date: 09/23/2012  Nuclear Med Background Indication for Stress Test:  Evaluation for Ischemia, Stent Patency and Abnormal EKG History:  PTCA/Stent 2000 Cardiac Risk Factors: Hypertension, Lipids and Obesity  Symptoms:  SOB   Nuclear Pre-Procedure Caffeine/Decaff Intake:  None NPO After: 6:00am   Lungs:  N/A O2 Sat: N/A% on N/A. IV 0.9% NS with Angio Cath:  22g  IV Site: R Hand  IV Started by:  Bonnita Levan, RN  Chest Size (in):  42 Cup Size: C  Height: 5\' 7"  (1.702 m)  Weight:  163 lb (73.936 kg)  BMI:  Body mass index is 25.53 kg/(m^2). Tech Comments:  75 mg aminophylline iv post Lexiscan injection    Nuclear Med Study 1 or 2 day study: 1 day  Stress Test Type:  Lexiscan  Order Authorizing Provider:   Daphene Jaeger, MD   Resting Radionuclide: Technetium 32m Sestamibi  Resting Radionuclide Dose: 10.2 mCi   Stress Radionuclide:  Technetium 94m Sestamibi  Stress Radionuclide Dose: 30.9 mCi           Stress Protocol Rest HR:47 Stress HR:71  Rest BP: 180/84 Stress BP: 187/84  Exercise Time (min): n/a METS: n/a   Predicted Max HR: 147 bpm % Max HR: 48.3 bpm Rate Pressure Product: 21308   Dose of Adenosine (mg):  n/a Dose of Lexiscan: 0.4 mg  Dose of Atropine (mg): n/a Dose of Dobutamine: n/a mcg/kg/min (at max HR)  Stress Test Technologist: Ernestene Mention, CCT Nuclear Technologist: Gonzella Lex, CNMT   Rest Procedure:  Myocardial perfusion imaging was performed at rest 45 minutes following the intravenous administration of Technetium 82m Sestamibi. Stress Procedure:  The patient received IV Lexiscan 0.4 mg over 15-seconds.  Technetium 36m Sestamibi injected at 30-seconds.  There were no significant changes with Lexiscan.  Quantitative  spect images were obtained after a 45 minute delay.  Transient Ischemic Dilatation (Normal <1.22):  1.13 Lung/Heart Ratio (Normal <0.45):  0.24 QGS EDV:  77 ml QGS ESV:  25 ml LV Ejection Fraction: 68%

## 2012-10-17 ENCOUNTER — Other Ambulatory Visit: Payer: Self-pay | Admitting: Family Medicine

## 2012-10-21 ENCOUNTER — Ambulatory Visit (INDEPENDENT_AMBULATORY_CARE_PROVIDER_SITE_OTHER): Payer: Medicare Other | Admitting: Family Medicine

## 2012-10-21 DIAGNOSIS — Z23 Encounter for immunization: Secondary | ICD-10-CM

## 2012-11-26 ENCOUNTER — Other Ambulatory Visit: Payer: Self-pay | Admitting: Family Medicine

## 2012-11-30 ENCOUNTER — Other Ambulatory Visit (INDEPENDENT_AMBULATORY_CARE_PROVIDER_SITE_OTHER): Payer: PRIVATE HEALTH INSURANCE

## 2012-11-30 DIAGNOSIS — Z0000 Encounter for general adult medical examination without abnormal findings: Secondary | ICD-10-CM

## 2012-11-30 DIAGNOSIS — I2510 Atherosclerotic heart disease of native coronary artery without angina pectoris: Secondary | ICD-10-CM

## 2012-11-30 DIAGNOSIS — E785 Hyperlipidemia, unspecified: Secondary | ICD-10-CM

## 2012-11-30 DIAGNOSIS — I10 Essential (primary) hypertension: Secondary | ICD-10-CM

## 2012-11-30 LAB — POCT URINALYSIS DIPSTICK
Bilirubin, UA: NEGATIVE
Blood, UA: NEGATIVE
Glucose, UA: NEGATIVE
Ketones, UA: NEGATIVE
Leukocytes, UA: NEGATIVE
Nitrite, UA: NEGATIVE
Protein, UA: NEGATIVE
Spec Grav, UA: 1.025
Urobilinogen, UA: 0.2
pH, UA: 5.5

## 2012-12-04 NOTE — Progress Notes (Signed)
Quick Note:  I spoke with pt, she had the labs already done before her CPE, but not the urine. ______

## 2012-12-04 NOTE — Progress Notes (Signed)
Quick Note:  I spoke with pt, she had the labs done before the CPE but not the UA. ______

## 2012-12-07 ENCOUNTER — Ambulatory Visit (INDEPENDENT_AMBULATORY_CARE_PROVIDER_SITE_OTHER): Payer: PRIVATE HEALTH INSURANCE | Admitting: Family Medicine

## 2012-12-07 ENCOUNTER — Encounter: Payer: Self-pay | Admitting: Family Medicine

## 2012-12-07 VITALS — BP 138/70 | HR 62 | Temp 98.2°F | Ht 67.5 in | Wt 163.0 lb

## 2012-12-07 DIAGNOSIS — Z0000 Encounter for general adult medical examination without abnormal findings: Secondary | ICD-10-CM

## 2012-12-07 NOTE — Progress Notes (Signed)
  Subjective:    Patient ID: Barbara [name], female    DOB: 11/14/1940, 72 y.o.   MRN: [id number]  HPI 73 yr old female for a cpx. She feels well in general. She saw Dr. Tresa Endo in October and got a good report from him, including all her fasting labs. She had another stress test and an ECHO, all of which were unremarkable.    Review of Systems  Constitutional: Negative.   HENT: Negative.   Eyes: Negative.   Respiratory: Negative.   Cardiovascular: Negative.   Gastrointestinal: Negative.   Genitourinary: Negative for dysuria, urgency, frequency, hematuria, flank pain, decreased urine volume, enuresis, difficulty urinating, pelvic pain and dyspareunia.  Musculoskeletal: Negative.   Skin: Negative.   Neurological: Negative.   Hematological: Negative.   Psychiatric/Behavioral: Negative.        Objective:   Physical Exam  Constitutional: She is oriented to person, place, and time. She appears well-developed and well-nourished. No distress.  HENT:  Head: Normocephalic and atraumatic.  Right Ear: External ear normal.  Left Ear: External ear normal.  Nose: Nose normal.  Mouth/Throat: Oropharynx is clear and moist. No oropharyngeal exudate.  Eyes: Conjunctivae normal and EOM are normal. Pupils are equal, round, and reactive to light. No scleral icterus.  Neck: Normal range of motion. Neck supple. No JVD present. No thyromegaly present.  Cardiovascular: Normal rate, regular rhythm and intact distal pulses.  Exam reveals no gallop and no friction rub.        She has her usual 2/6 SM over the left sternal border   Pulmonary/Chest: Effort normal and breath sounds normal. No respiratory distress. She has no wheezes. She has no rales. She exhibits no tenderness.  Abdominal: Soft. Bowel sounds are normal. She exhibits no distension and no mass. There is no tenderness. There is no rebound and no guarding.  Musculoskeletal: Normal range of motion. She exhibits no edema and no tenderness.   Lymphadenopathy:    She has no cervical adenopathy.  Neurological: She is alert and oriented to person, place, and time. She has normal reflexes. No cranial nerve deficit. She exhibits normal muscle tone. Coordination normal.  Skin: Skin is warm and dry. No rash noted. No erythema.  Psychiatric: She has a normal mood and affect. Her behavior is normal. Judgment and thought content normal.          Assessment & Plan:  Well exam.

## 2013-03-01 ENCOUNTER — Encounter: Payer: Self-pay | Admitting: Cardiovascular Disease

## 2013-03-19 ENCOUNTER — Other Ambulatory Visit: Payer: Self-pay | Admitting: Family Medicine

## 2013-03-19 NOTE — Telephone Encounter (Signed)
Can we refill this? 

## 2013-03-19 NOTE — Telephone Encounter (Signed)
Okay for one year  

## 2013-06-03 ENCOUNTER — Other Ambulatory Visit: Payer: Self-pay | Admitting: Family Medicine

## 2013-06-10 ENCOUNTER — Encounter: Payer: Self-pay | Admitting: Family Medicine

## 2013-06-10 ENCOUNTER — Ambulatory Visit (INDEPENDENT_AMBULATORY_CARE_PROVIDER_SITE_OTHER): Payer: PRIVATE HEALTH INSURANCE | Admitting: Family Medicine

## 2013-06-10 VITALS — BP 128/68 | HR 56 | Temp 98.3°F | Wt 166.0 lb

## 2013-06-10 DIAGNOSIS — L989 Disorder of the skin and subcutaneous tissue, unspecified: Secondary | ICD-10-CM

## 2013-06-10 NOTE — Progress Notes (Signed)
  Subjective:    Patient ID: Barbara [name], female    DOB: 22-May-1941, 72 y.o.   MRN: [id number]  HPI Growth left anterior shoulder She noted some type of bug bite in June and noticed this growth following that. Occasional itching. No pain. No bleeding. She's tried topical creams without improvement No lymphadenopathy.  Past Medical History  Diagnosis Date  . Allergy   . Hyperlipidemia   . Hypertension   . CAD (coronary artery disease)     sees Dr. Nicki Guadalajara   . Colon polyps   . Osteoarthritis    Past Surgical History  Procedure Laterality Date  . Abdominal hysterectomy  1971  . Coronary stent placement  2000    in LAD  . Colonoscopy  04-28-09    per Dr. Dorena Cookey, clear, repeat in 5 yrs     reports that she quit smoking about 16 years ago. She has never used smokeless tobacco. She reports that she does not drink alcohol or use illicit drugs. family history is not on file. Allergies  Allergen Reactions  . Morphine   . Procaine Hcl     rash  . Sulfonamide Derivatives   . Vytorin (Ezetimibe-Simvastatin)       Review of Systems  Constitutional: Negative for appetite change and unexpected weight change.  Hematological: Negative for adenopathy.       Objective:   Physical Exam  Constitutional: She appears well-developed and well-nourished.  Cardiovascular: Normal rate and regular rhythm.   Skin:  Patient has well demarcated,symmetric,  rounded 3-4 mm nodular lesion left anterior shoulder.          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 Reassurance. She requests excision. Will reschedule with primary

## 2013-06-10 NOTE — Patient Instructions (Addendum)
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

## 2013-06-21 ENCOUNTER — Telehealth: Payer: Self-pay | Admitting: Family Medicine

## 2013-06-21 NOTE — Telephone Encounter (Signed)
I mentioned to Dr Clent Ridges and he stated that he could remove.  Obviously, he needs to see her first to make sure he is comfortable with this.

## 2013-06-21 NOTE — Telephone Encounter (Signed)
Called pt and LMOM to schedule.

## 2013-06-21 NOTE — Telephone Encounter (Signed)
Pt was seen on 8/7 by Dr. Caryl Never when Dr Clent Ridges was out. She states it was for possible bug bite but a possible lesion. OV note states she would schedule removal with PCP. However, pt states that Dr Caryl Never said he would talk w/ Dr Clent Ridges first, and someone would call her back. I don't see this in the chart, but she did not hear back from anyone in this office. Please advise and call pt.

## 2013-06-28 ENCOUNTER — Ambulatory Visit (INDEPENDENT_AMBULATORY_CARE_PROVIDER_SITE_OTHER): Payer: Medicare Other | Admitting: Family Medicine

## 2013-06-28 ENCOUNTER — Encounter: Payer: Self-pay | Admitting: Family Medicine

## 2013-06-28 VITALS — BP 148/70 | HR 61 | Temp 98.0°F | Wt 166.0 lb

## 2013-06-28 DIAGNOSIS — L989 Disorder of the skin and subcutaneous tissue, unspecified: Secondary | ICD-10-CM

## 2013-06-28 NOTE — Progress Notes (Signed)
  Subjective:    Patient ID: Barbara [name], female    DOB: 04-11-1941, 72 y.o.   MRN: [id number]  HPI Here to remove a lesion from the left anterior shoulder. This becomes tender and swollen when it rubs against clothing.    Review of Systems  Constitutional: Negative.        Objective:   Physical Exam  Constitutional: She appears well-developed and well-nourished.  Skin:  1.4 cm firm dark brown papular lesion as above           Assessment & Plan:  The area was cleansed with Betadine. LA was achieved using 2% Xylocaine with epinephrine. An elliptical incision was made around the lesion down through the entire dermis and the lesion was removed. This was sent to Pathology. The wound was closed with 3 sutures of 4-0 Ethilon. Dressed with Neosporin and a Bandaid. Sutures to be removed in 14 days.

## 2013-07-02 NOTE — Progress Notes (Signed)
Quick Note:  I spoke with pt ______ 

## 2013-07-12 ENCOUNTER — Ambulatory Visit (INDEPENDENT_AMBULATORY_CARE_PROVIDER_SITE_OTHER): Payer: Medicare Other | Admitting: Family Medicine

## 2013-07-12 ENCOUNTER — Encounter: Payer: Self-pay | Admitting: Family Medicine

## 2013-07-12 VITALS — BP 144/76 | HR 63 | Temp 98.2°F | Wt 164.0 lb

## 2013-07-12 DIAGNOSIS — L989 Disorder of the skin and subcutaneous tissue, unspecified: Secondary | ICD-10-CM

## 2013-07-12 DIAGNOSIS — Z23 Encounter for immunization: Secondary | ICD-10-CM

## 2013-07-12 NOTE — Progress Notes (Signed)
  Subjective:    Patient ID: Barbara [name], female    DOB: Dec 20, 1940, 72 y.o.   MRN: [id number]  HPI Here to remove sutures from a procedure we did on 06-28-13. We removed a lesion from the left anterior shoulder and 3 sutures were placed. The pathology report confirmed this to be the result of an inflammatory reaction to an arthropod bite. The area feels fine now.     Review of Systems  Constitutional: Negative.   Skin: Positive for wound.       Objective:   Physical Exam  Constitutional: She appears well-developed and well-nourished.  Skin:  The area looks clean and is healing nicely.           Assessment & Plan:  All 3 sutures were removed. Recheck prn

## 2013-07-22 ENCOUNTER — Other Ambulatory Visit: Payer: Self-pay | Admitting: Cardiovascular Disease

## 2013-07-22 NOTE — Telephone Encounter (Signed)
Rx was sent to pharmacy electronically. 

## 2013-08-10 ENCOUNTER — Other Ambulatory Visit: Payer: Self-pay | Admitting: Cardiovascular Disease

## 2013-08-10 NOTE — Telephone Encounter (Signed)
Rx was sent to pharmacy electronically. 

## 2013-09-05 ENCOUNTER — Encounter: Payer: Self-pay | Admitting: *Deleted

## 2013-09-07 ENCOUNTER — Ambulatory Visit (INDEPENDENT_AMBULATORY_CARE_PROVIDER_SITE_OTHER): Payer: Medicare Other | Admitting: Cardiovascular Disease

## 2013-09-07 ENCOUNTER — Encounter: Payer: Self-pay | Admitting: Cardiovascular Disease

## 2013-09-07 VITALS — BP 126/66 | Ht 68.0 in | Wt 166.9 lb

## 2013-09-07 DIAGNOSIS — E782 Mixed hyperlipidemia: Secondary | ICD-10-CM

## 2013-09-07 DIAGNOSIS — I2510 Atherosclerotic heart disease of native coronary artery without angina pectoris: Secondary | ICD-10-CM

## 2013-09-07 DIAGNOSIS — I10 Essential (primary) hypertension: Secondary | ICD-10-CM

## 2013-09-07 DIAGNOSIS — E785 Hyperlipidemia, unspecified: Secondary | ICD-10-CM

## 2013-09-07 NOTE — Patient Instructions (Addendum)
Your physician recommends that you schedule a follow-up appointment in: 1 year  

## 2013-09-09 ENCOUNTER — Encounter: Payer: Self-pay | Admitting: Cardiovascular Disease

## 2013-09-09 NOTE — Progress Notes (Signed)
Patient ID: Barbara [name], female   DOB: 11-27-1940, 72 y.o.   MRN: [id number]     HPI: Barbara [name] is a 72 y.o. female who presents for 1 year cardiology evaluation. I last saw her November 2013.  As Landry has established coronary artery disease and in November 2000 underwent stenting of a high-grade LAD stenosis. Her last nuclear perfusion study in November 2013 is unchanged from previously and continued to show normal perfusion without scar or ischemia. Ejection fraction was 68%. An echo Doppler study revealed an ejection fraction in the 55-60% range with grade 1 diastolic dysfunction. 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  Additional problems include hypertension as well as hyperlipidemia. Over the year, she has continued to do well. She does note some occasional ankle swelling. She does complain of symptoms suggestive of possible plantar fasciitis. She denies chest pressure. He denies palpitations. She will be seeing Dr. Clent Ridges in several months and will be obtaining a complete set  laboratory at that time.  Past Medical History  Diagnosis Date  . Allergy   . Hyperlipidemia   . Hypertension   . CAD (coronary artery disease)     sees Dr. Nicki Guadalajara   . Colon polyps   . Osteoarthritis   . History of stress test     show normal perfusion without scar or ischemia, post EF 68%  . Hx of echocardiogram     show an EF 55%-60% range with grade 1 diastolic dysfunction, she had mitral anular calcification with mild MR, moderate LA dilation and mild pulmonary hypertension with a PA estimated pressure of 39mm    Past Surgical History  Procedure Laterality Date  . Abdominal hysterectomy  1971  . Coronary stent placement  2000    in LAD  . Colonoscopy  04-28-09    per Dr. Dorena Cookey, clear, repeat in 5 yrs     Allergies  Allergen Reactions  . Morphine   . Procaine  Hcl     rash  . Sulfonamide Derivatives   . Vytorin [Ezetimibe-Simvastatin]     Current Outpatient Prescriptions  Medication Sig Dispense Refill  . amLODipine (NORVASC) 10 MG tablet Take 10 mg by mouth daily.        Marland Kitchen aspirin 81 MG tablet Take 81 mg by mouth daily. Take 2 tablets every morning      . CRESTOR 10 MG tablet TAKE 1 TABLET DAILY  90 tablet  0  . diclofenac (VOLTAREN) 75 MG EC tablet TAKE ONE TABLET BY MOUTH TWICE DAILY AS NEEDED FOR PAIN  60 tablet  6  . furosemide (LASIX) 20 MG tablet Take 10 mg by mouth every other day. As needed      . KLOR-CON 10 10 MEQ tablet TAKE 1 TABLET DAILY  90 tablet  3  . loratadine (CLARITIN) 10 MG tablet Take 10 mg by mouth daily.      Marland Kitchen losartan-hydrochlorothiazide (HYZAAR) 100-25 MG per tablet TAKE 1 TABLET DAILY  90 tablet  0  . Multiple Vitamin (MULTIVITAMINS PO) Take by mouth.        . triamcinolone (NASACORT) 55 MCG/ACT nasal inhaler        Current Facility-Administered Medications  Medication Dose Route Frequency Provider Last Rate Last Dose  . cefTRIAXone (ROCEPHIN) injection 500 mg  500 mg Intramuscular Q24H [name], MD   500 mg at 12/21/10 1642    History  Social History  . Marital Status: Married    Spouse Name: N/A    Number of Children: N/A  . Years of Education: N/A   Occupational History  . Not on file.   Social History Main Topics  . Smoking status: Former Smoker    Quit date: 11/04/1996  . Smokeless tobacco: Never Used  . Alcohol Use: No  . Drug Use: No  . Sexual Activity: Not on file   Other Topics Concern  . Not on file   Social History Narrative  . No narrative on file    Family History  Problem Relation Age of Onset  . Cancer Maternal Grandmother   . Heart disease Maternal Grandfather     ROS is negative for fevers, chills or night sweats. She denies change in vision. She denies cough or sputum production. She does wear glasses. She denies recent chest pain. She's unaware of palpitations.  There is no presyncope or syncope. She denies abdominal pain. At times she does know some mild ankle swelling. She denies claudication. She doesn't do to some comfort it could plantar aspect of her left foot. There is no diabetes. He does have elevated lipids  Other comprehensive 12 point system review is negative.  PE BP 126/66  Ht 5\' 8"  (1.727 m)  Wt 166 lb 14.4 oz (75.705 kg)  BMI 25.38 kg/m2  General: Alert, oriented, no distress.  Skin: normal turgor, no rashes HEENT: Normocephalic, atraumatic. Pupils round and reactive; sclera anicteric;no lid lag.  Nose without nasal septal hypertrophy Mouth/Parynx benign; Mallinpatti scale 2  Neck: No JVD, no carotid briuts Lungs: clear to ausculatation and percussion; no wheezing or rales Heart: RRR, s1 s2 normal 1/6 systolic murmur. Abdomen: soft, nontender; no hepatosplenomehaly, BS+; abdominal aorta nontender and not dilated by palpation. Pulses 2+ Extremities: no clubbing cyanosis or edema, Homan's sign negative  Neurologic: grossly nonfocal Psychologic: normal affect and mood.  ECG: Sinus bradycardia with sinus arrhythmia. Previously noted LVH with marked repolarization T-wave abnormalities which have been present previously.  LABS:  BMET    Component Value Date/Time   NA 143 12/05/2011 1117   K 3.3* 12/05/2011 1117   CL 101 12/05/2011 1117   CO2 31 12/05/2011 1117   GLUCOSE 63* 12/05/2011 1117   BUN 18 12/05/2011 1117   CREATININE 0.6 12/05/2011 1117   CALCIUM 9.8 12/05/2011 1117   GFRNONAA 119.60 10/17/2010 0939     Hepatic Function Panel     Component Value Date/Time   PROT 7.6 12/05/2011 1117   ALBUMIN 4.4 12/05/2011 1117   AST 21 12/05/2011 1117   ALT 17 12/05/2011 1117   ALKPHOS 69 12/05/2011 1117   BILITOT 0.9 12/05/2011 1117   BILIDIR 0.1 12/05/2011 1117     CBC    Component Value Date/Time   WBC 6.1 12/05/2011 1117   RBC 4.83 12/05/2011 1117   HGB 14.7 12/05/2011 1117   HCT 43.5 12/05/2011 1117   PLT 208.0 12/05/2011  1117   MCV 90.1 12/05/2011 1117   MCHC 33.8 12/05/2011 1117   RDW 12.7 12/05/2011 1117   LYMPHSABS 1.8 12/05/2011 1117   MONOABS 0.4 12/05/2011 1117   EOSABS 0.2 12/05/2011 1117   BASOSABS 0.0 12/05/2011 1117     BNP No results found for this basename: probnp    Lipid Panel     Component Value Date/Time   CHOL 146 12/05/2011 1117   TRIG 84.0 12/05/2011 1117   HDL 59.80 12/05/2011 1117   CHOLHDL 2 12/05/2011 1117  VLDL 16.8 12/05/2011 1117   LDLCALC 69 12/05/2011 1117     RADIOLOGY: No results found.    ASSESSMENT AND PLAN:  Ms. Castles continues to do well. She is now 14 years status post stenting of a high-grade proximal LAD stenosis with insertion of an [id number] 3.5x18 mm bare-metal stent. Her blood pressure today is well controlled. She's not having any anginal symptoms. She is on losartan/HCTZ 100/25 as well as furosemide which he takes when necessary leg swelling. I suspect she may have some plantar fasciitis. She does take Voltaren as needed. Dr. Clent Ridges will be checking laboratory in the next month and will ask that these be that these be available for my review. Her ECG does show abnormalities but these are unchanged from previously. Last nuclear stress test one year ago continued to show normal perfusion. As long as she remains stable, I will see her in one year for cardiology reevaluation.    Lennette Bihari, MD, [facility]  09/09/2013 4:03 PM

## 2013-09-14 ENCOUNTER — Encounter: Payer: Self-pay | Admitting: Cardiovascular Disease

## 2013-10-09 ENCOUNTER — Encounter (HOSPITAL_COMMUNITY): Payer: Self-pay | Admitting: Emergency Medicine

## 2013-10-09 ENCOUNTER — Emergency Department (INDEPENDENT_AMBULATORY_CARE_PROVIDER_SITE_OTHER): Payer: Medicare Other

## 2013-10-09 ENCOUNTER — Emergency Department (INDEPENDENT_AMBULATORY_CARE_PROVIDER_SITE_OTHER)
Admission: EM | Admit: 2013-10-09 | Discharge: 2013-10-09 | Disposition: A | Discharge status: Home / Self Care | Payer: Medicare Other | Source: Home / Self Care | Attending: Emergency Medicine | Admitting: Emergency Medicine

## 2013-10-09 DIAGNOSIS — J189 Pneumonia, unspecified organism: Secondary | ICD-10-CM

## 2013-10-09 IMAGING — CR DG CHEST 2V
2 series · 2 of 2 positions shown · non-contrast
Comparison: None.

CLINICAL DATA: Fever

EXAM:
CHEST  2 VIEW

[view not recorded (1 of 2)]
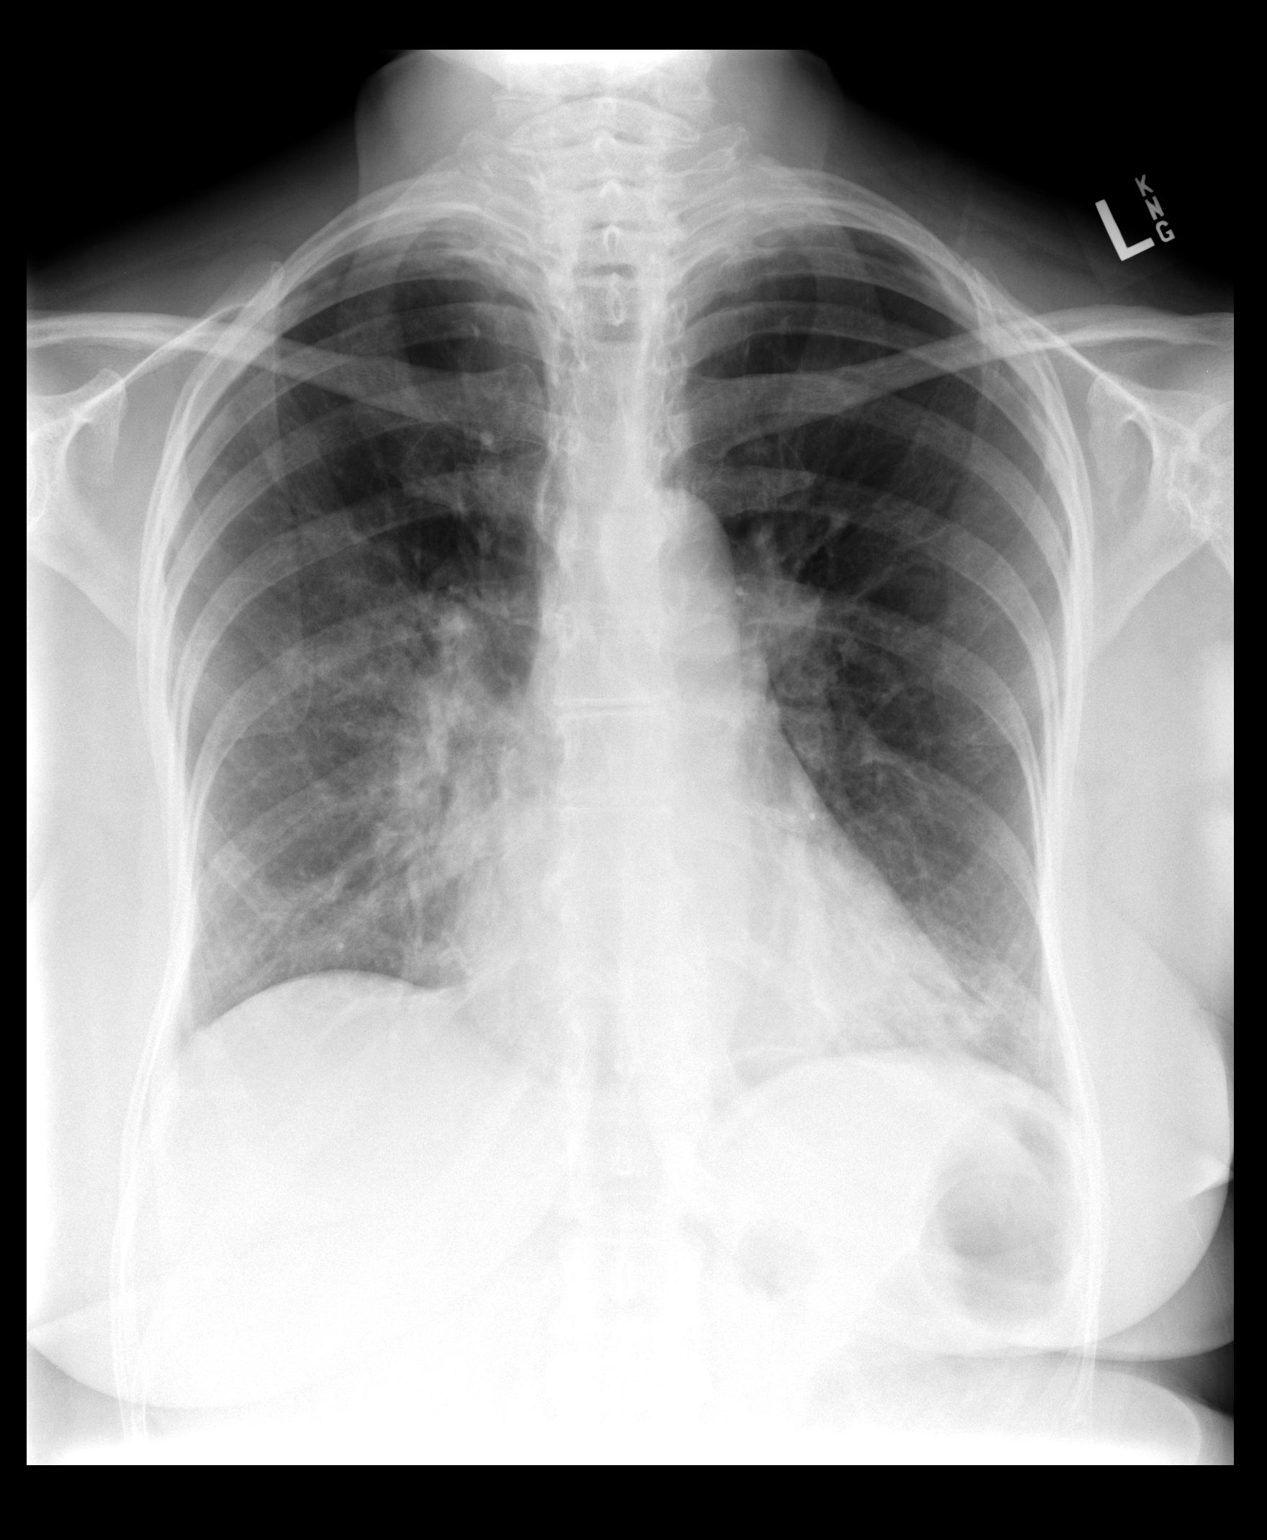

[view not recorded (2 of 2)]
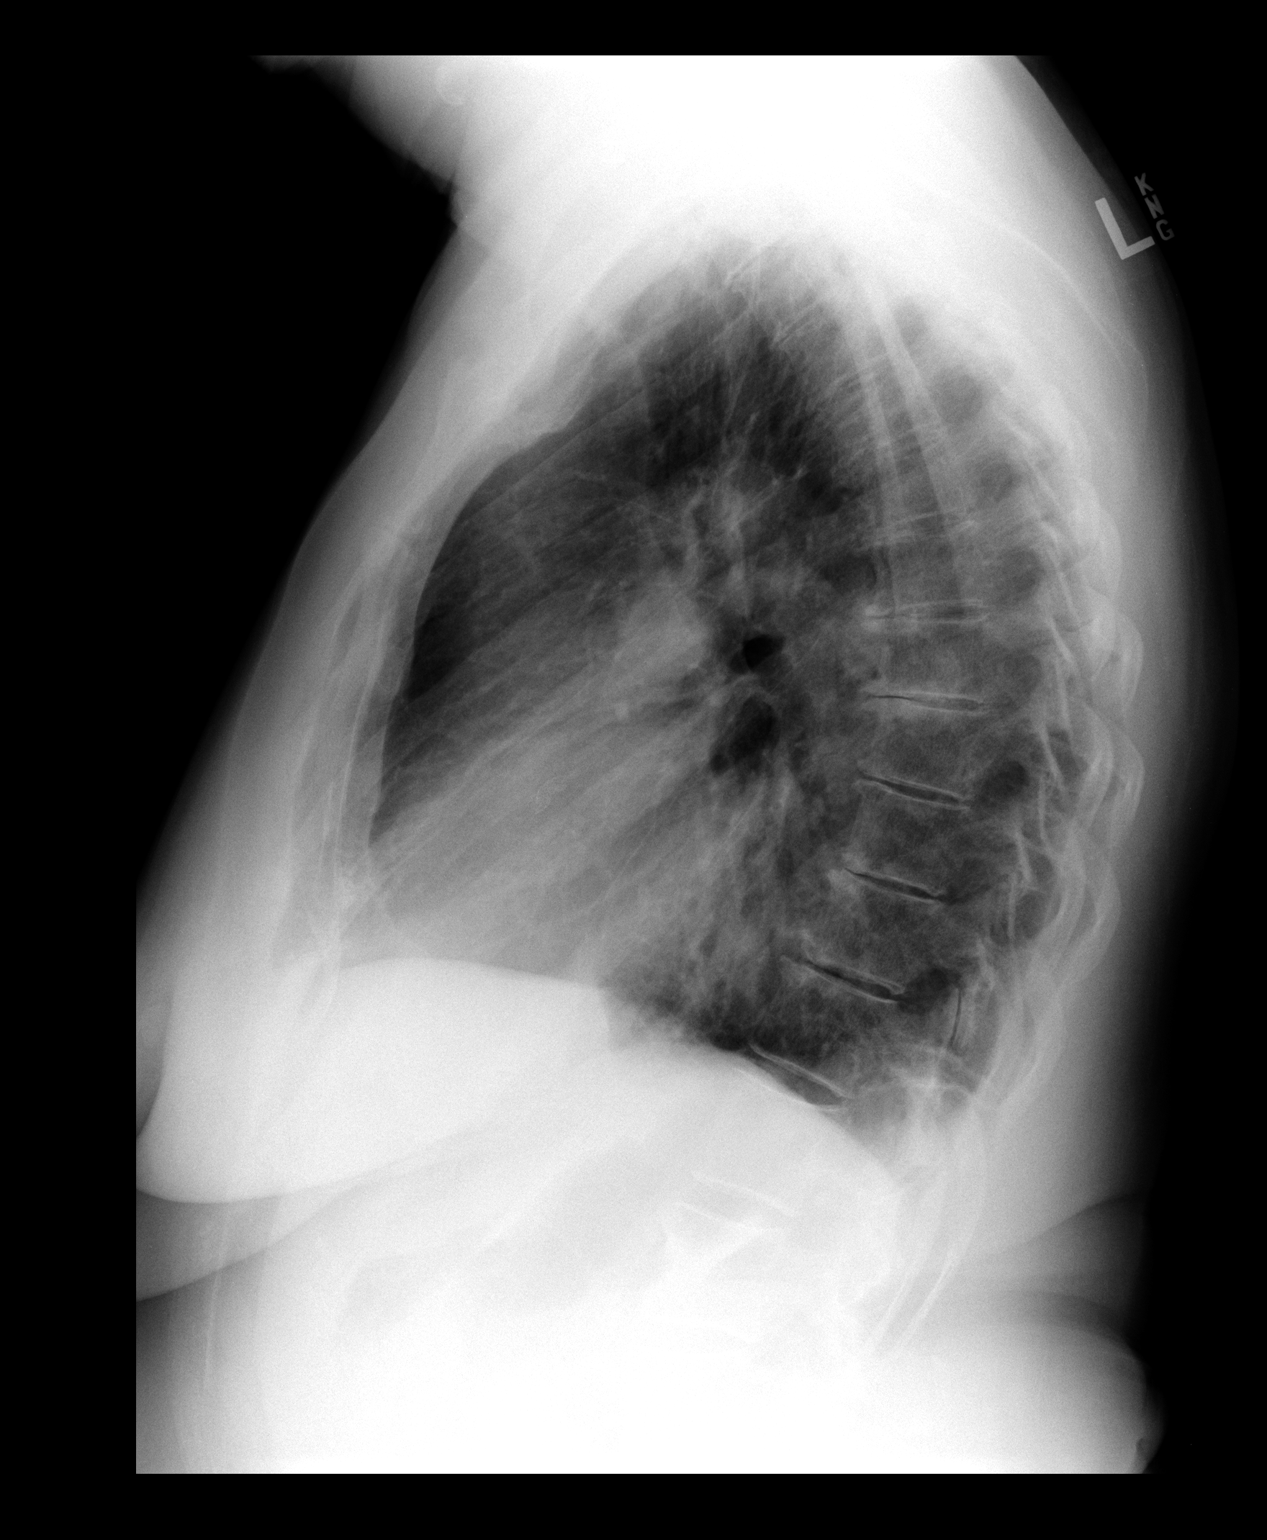

[2 of 2 positions shown; findings below may reference images not displayed]

FINDINGS: Ill-defined right perihilar and inferior left lingular airspace
opacities. Cardiac and mediastinal contours are otherwise within
normal limits. Trace atherosclerotic calcification noted in the
transverse aorta. The lungs appear mildly hyperinflated. Query upper
lung emphysematous changes. No acute osseous abnormality.
IMPRESSION: 1. Patchy right perihilar infiltrate suspicious for pneumonia.
Recommend imaging followup to resolution as a central obstructing
mass and postobstructive changes could appear similar.
2. Ill-defined left inferior lingular opacity is less specific and
may represent a 2nd focus of infiltrate, or chronic
atelectasis/scarring.
3. Mild pulmonary hyperinflation. Query bilateral upper lung
emphysematous changes.
4. Trace aortic atherosclerosis.

## 2013-10-09 MED ORDER — CEFDINIR 300 MG PO CAPS: 300.0000 mg | ORAL_CAPSULE | Freq: Two times a day (BID) | ORAL | Status: DC

## 2013-10-09 MED ORDER — AZITHROMYCIN 250 MG PO TABS: ORAL_TABLET | ORAL | Status: DC

## 2013-10-09 NOTE — ED Notes (Addendum)
C/o post nasal drainage, chills, hoarseness and runny nose.  Denies cough or sore throat.  Sx started 2 days ago

## 2013-10-09 NOTE — ED Provider Notes (Signed)
Chief Complaint:   Chief Complaint  Patient presents with  . URI    History of Present Illness:   Barbara [name] is a 72 year old female who has had a three-day history of chills, postnasal drainage with mucus and blood, nasal congestion, hoarseness, slight cough, has had chills, felt hot, and achy. She denies any chest pain or shortness of breath. She's had no sore throat or headache. She denies a prior history of pneumonia.  Review of Systems:  Other than noted above, the patient denies any of the following symptoms: Systemic:  No fevers, chills, sweats, weight loss or gain, fatigue, or tiredness. Eye:  No redness or discharge. ENT:  No ear pain, drainage, headache, nasal congestion, drainage, sinus pressure, difficulty swallowing, or sore throat. Neck:  No neck pain or swollen glands. Lungs:  No cough, sputum production, hemoptysis, wheezing, chest tightness, shortness of breath or chest pain. GI:  No abdominal pain, nausea, vomiting or diarrhea.  PMFSH:  Past medical history, family history, social history, meds, and allergies were reviewed. She is allergic to morphine, sulfa, lidocaine type drugs, and possibly to penicillin. She takes amlodipine, aspirin, Crestor, Voltaren, Lasix, Klor-Con, Hyzaar, and Nasacort. She has a history of high blood pressure, hypercholesterolemia, coronary artery disease, and allergies.  Physical Exam:   Vital signs:  BP 125/62  Pulse 79  Temp(Src) 100.1 F (37.8 C) (Oral)  Resp 18  SpO2 94% General:  Alert and oriented.  In no distress.  Skin warm and dry. Eye:  No conjunctival injection or drainage. Lids were normal. ENT:  TMs and canals were normal, without erythema or inflammation.  Nasal mucosa was clear and uncongested, without drainage.  Mucous membranes were moist.  Pharynx was clear with no exudate or drainage.  There were no oral ulcerations or lesions. Neck:  Supple, no adenopathy, tenderness or mass. Lungs:  No respiratory distress.   Lungs were clear to auscultation, without wheezes, rales or rhonchi.  Breath sounds were clear and equal bilaterally.  Heart:  Regular rhythm, without gallops, murmers or rubs. Skin:  Clear, warm, and dry, without rash or lesions.  Radiology:  Dg Chest 2 View  10/09/2013   CLINICAL DATA:  Fever  EXAM: CHEST  2 VIEW  COMPARISON:  None.  FINDINGS: Ill-defined right perihilar and inferior left lingular airspace opacities. Cardiac and mediastinal contours are otherwise within normal limits. Trace atherosclerotic calcification noted in the transverse aorta. The lungs appear mildly hyperinflated. Query upper lung emphysematous changes. No acute osseous abnormality.  IMPRESSION: 1. Patchy right perihilar infiltrate suspicious for pneumonia. Recommend imaging followup to resolution as a central obstructing mass and postobstructive changes could appear similar. 2. Ill-defined left inferior lingular opacity is less specific and may represent a 2nd focus of infiltrate, or chronic atelectasis/scarring. 3. Mild pulmonary hyperinflation. Query bilateral upper lung emphysematous changes. 4. Trace aortic atherosclerosis.   Electronically Signed   By: Malachy Moan M.D.   On: 10/09/2013 14:29   Assessment:  The encounter diagnosis was Community acquired pneumonia.  This appears to be very early or mild pneumonia. I have thought about giving her IM Rocephin, but since she cannot take lidocaine, this would be extremely painful shot for, and I opted to try by mouth drugs instead. She is to followup with her primary care physician early next week. She'll need to have her chest x-rays followed out until they are completely clear.  Plan:   1.  Meds:  The following meds were prescribed:   Discharge Medication List  as of 10/09/2013  2:45 PM    START taking these medications   Details  azithromycin (ZITHROMAX Z-PAK) 250 MG tablet Take as directed., Normal    cefdinir (OMNICEF) 300 MG capsule Take 1 capsule (300 mg  total) by mouth 2 (two) times daily., Starting 10/09/2013, Until Discontinued, Normal        2.  Patient Education/Counseling:  The patient was given appropriate handouts, self care instructions, and instructed in symptomatic relief.  Advised rest and fluids.  3.  Follow up:  The patient was told to follow up if no better in 3 to 4 days, if becoming worse in any way, and given some red flag symptoms such as worsening fever or difficulty breathing which would prompt immediate return.  Follow up with her primary care physician in 3 days.      Reuben Likes, MD 10/09/13 [phone]

## 2013-10-13 ENCOUNTER — Ambulatory Visit (INDEPENDENT_AMBULATORY_CARE_PROVIDER_SITE_OTHER): Payer: Medicare Other | Admitting: Family Medicine

## 2013-10-13 ENCOUNTER — Encounter: Payer: Self-pay | Admitting: Family Medicine

## 2013-10-13 VITALS — BP 140/70 | HR 85 | Temp 99.5°F | Wt 167.0 lb

## 2013-10-13 DIAGNOSIS — J189 Pneumonia, unspecified organism: Secondary | ICD-10-CM

## 2013-10-13 MED ORDER — HYDROCODONE-HOMATROPINE 5-1.5 MG/5ML PO SYRP: 5.0000 mL | ORAL_SOLUTION | ORAL | Status: DC | PRN

## 2013-10-13 MED ORDER — LEVOFLOXACIN 500 MG PO TABS: 500.0000 mg | ORAL_TABLET | Freq: Every day | ORAL | Status: DC

## 2013-10-13 NOTE — Progress Notes (Signed)
Pre visit review using our clinic review tool, if applicable. No additional management support is needed unless otherwise documented below in the visit note. 

## 2013-10-13 NOTE — Progress Notes (Signed)
   Subjective:    Patient ID: Barbara [name], female    DOB: 1941-03-06, 72 y.o.   MRN: [id number]  HPI Here to follow up an Urgent Care visit on 10-09-13 for fevers and coughing up dark sputum. Her chest sounded clear on exam that day but a CXR revealed opacities around the right perihilar region and the left lingula which were felt to represent pneumonia. She was given a Rocephin shot and she has taken Azitrhomycin and Cefdinir since then. However she has not really improved since then. She still has fevers and she is still coughing. Drinking fluids. No chest pain   Review of Systems  Constitutional: Positive for fever, chills and diaphoresis.  HENT: Negative.   Eyes: Negative.   Respiratory: Positive for cough.   Cardiovascular: Negative.        Objective:   Physical Exam  Constitutional: She appears well-developed and well-nourished. No distress.  HENT:  Right Ear: External ear normal.  Left Ear: External ear normal.  Nose: Nose normal.  Mouth/Throat: Oropharynx is clear and moist.  Eyes: Conjunctivae are normal.  Pulmonary/Chest: Effort normal and breath sounds normal. No respiratory distress. She has no wheezes. She has no rales.  Lymphadenopathy:    She has no cervical adenopathy.          Assessment & Plan:  Partially treated pneumonia. We will stop the Zpack and the cefdinir, and she will start on 10 days of Levaquin. We will see her back for a recheck next week and we plan to get another CXR at that time.

## 2013-10-14 ENCOUNTER — Telehealth: Payer: Self-pay | Admitting: Family Medicine

## 2013-10-14 NOTE — Telephone Encounter (Signed)
Tell her to stop taking the prescription cough med and use Delsym instead. Follow up prn

## 2013-10-14 NOTE — Telephone Encounter (Signed)
Called and spoke with pt and pt is aware of Dr. Claris Che recommendations.  Pt verbalized understanding.

## 2013-10-14 NOTE — Telephone Encounter (Signed)
Patient Information:  Caller Name: Tracee  Phone: [phone]  Patient: Haylyn, Halberg  Gender: Female  DOB: [birth year]  Age: 72 Years  PCP: Gershon Crane [facility])  Office Follow Up:  Does the office need to follow up with this patient?: Yes  Instructions For The Office: Contact patient to give further instructions.  RN Note:  Patient's husband was in the office this morning and he was told by the doctor to have the patient call with a report of her current symptoms. The symptoms all started after she began taking the prescribed cough syrup. Please contact patient to give further instructions.  Symptoms  Reason For Call & Symptoms: Fluttering feeling at the top of the abdomen below the ribcage, chills, lightheaded. Has gotten worse through the night. Began after taking prescribed cough medicine.  Reviewed Health History In EMR: Yes  Reviewed Medications In EMR: Yes  Reviewed Allergies In EMR: Yes  Reviewed Surgeries / Procedures: Yes  Date of Onset of Symptoms: 10/14/2013  Treatments Tried: Prescription cough syrup to help with coughing through the night.  Treatments Tried Worked: Yes  Guideline(s) Used:  No Protocol Available - Sick Adult  Disposition Per Guideline:   Discuss with PCP and Callback by Nurse within 1 Hour  Reason For Disposition Reached:   Nursing judgment  Advice Given:  N/A  Patient Will Follow Care Advice:  YES

## 2013-10-20 ENCOUNTER — Other Ambulatory Visit: Payer: Self-pay | Admitting: Family Medicine

## 2013-10-22 ENCOUNTER — Ambulatory Visit (INDEPENDENT_AMBULATORY_CARE_PROVIDER_SITE_OTHER)
Admission: RE | Admit: 2013-10-22 | Discharge: 2013-10-22 | Disposition: A | Discharge status: Home / Self Care | Payer: Medicare Other | Source: Ambulatory Visit | Attending: Family Medicine | Admitting: Family Medicine

## 2013-10-22 ENCOUNTER — Ambulatory Visit (INDEPENDENT_AMBULATORY_CARE_PROVIDER_SITE_OTHER): Payer: Medicare Other | Admitting: Family Medicine

## 2013-10-22 ENCOUNTER — Encounter: Payer: Self-pay | Admitting: Family Medicine

## 2013-10-22 VITALS — BP 138/64 | HR 72 | Temp 98.8°F | Wt 164.0 lb

## 2013-10-22 DIAGNOSIS — J189 Pneumonia, unspecified organism: Secondary | ICD-10-CM

## 2013-10-22 NOTE — Progress Notes (Signed)
   Subjective:    Patient ID: Barbara [name], female    DOB: [birth year], 72 y.o.   MRN: [id number]  HPI Here to follow up on pneumonia. This was diagnosed on 10-09-13. She was given a Zpack and Cefdinir originally, but these were stopped and she was switched to Levaquin 10 days ago. Now she feels much better and she is getting her strength back.    Review of Systems  Constitutional: Positive for fatigue. Negative for fever, chills and diaphoresis.  Eyes: Negative.   Respiratory: Negative.   Cardiovascular: Negative.        Objective:   Physical Exam  Constitutional: She appears well-developed and well-nourished. No distress.  Cardiovascular: Normal rate, regular rhythm, normal heart sounds and intact distal pulses.   Pulmonary/Chest: Effort normal and breath sounds normal. No respiratory distress. She has no wheezes. She has no rales. She exhibits no tenderness.          Assessment & Plan:  Pneumonia, which seems to be resolving. Get another CXR today

## 2013-10-22 NOTE — Progress Notes (Signed)
Pre visit review using our clinic review tool, if applicable. No additional management support is needed unless otherwise documented below in the visit note. 

## 2013-11-02 ENCOUNTER — Other Ambulatory Visit: Payer: Self-pay | Admitting: *Deleted

## 2013-11-02 MED ORDER — LOSARTAN POTASSIUM-HCTZ 100-25 MG PO TABS: 1.0000 | ORAL_TABLET | Freq: Every day | ORAL | Status: DC

## 2013-11-02 MED ORDER — AMLODIPINE BESYLATE 10 MG PO TABS: 10.0000 mg | ORAL_TABLET | Freq: Every day | ORAL | Status: DC

## 2013-11-02 NOTE — Telephone Encounter (Signed)
^^  Walk-In^^  Message received that pt needs refills on amlodipine 10 mg and losartan/hctz 100/25 mg before next week to [facility].  RN reviewed chart.  Pt last seen on 11.4.14 and no refills sent to pharmacy.  Refill(s) sent to pharmacy.   Returned call.  Pt verified x 2 and informed refills sent to pharmacy.  Pt also informed of refill process and verbalized understanding

## 2013-11-07 ENCOUNTER — Other Ambulatory Visit: Payer: Self-pay | Admitting: Cardiovascular Disease

## 2013-12-14 ENCOUNTER — Other Ambulatory Visit (INDEPENDENT_AMBULATORY_CARE_PROVIDER_SITE_OTHER): Payer: Medicare Other

## 2013-12-14 DIAGNOSIS — Z0000 Encounter for general adult medical examination without abnormal findings: Secondary | ICD-10-CM

## 2013-12-14 DIAGNOSIS — E785 Hyperlipidemia, unspecified: Secondary | ICD-10-CM

## 2013-12-14 DIAGNOSIS — I10 Essential (primary) hypertension: Secondary | ICD-10-CM

## 2013-12-14 LAB — POCT URINALYSIS DIPSTICK
Bilirubin, UA: NEGATIVE
Glucose, UA: NEGATIVE
Ketones, UA: NEGATIVE
Leukocytes, UA: NEGATIVE
Nitrite, UA: NEGATIVE
Protein, UA: NEGATIVE
Spec Grav, UA: 1.025
Urobilinogen, UA: 0.2
pH, UA: 5.5

## 2013-12-14 LAB — HEPATIC FUNCTION PANEL
ALT: 21 U/L (ref 0–35)
AST: 25 U/L (ref 0–37)
Albumin: 4 g/dL (ref 3.5–5.2)
Alkaline Phosphatase: 61 U/L (ref 39–117)
Bilirubin, Direct: 0.1 mg/dL (ref 0.0–0.3)
Total Bilirubin: 0.7 mg/dL (ref 0.3–1.2)
Total Protein: 7.2 g/dL (ref 6.0–8.3)

## 2013-12-14 LAB — CBC WITH DIFFERENTIAL/PLATELET
Basophils Absolute: 0 10*3/uL (ref 0.0–0.1)
Basophils Relative: 0.5 % (ref 0.0–3.0)
Eosinophils Absolute: 0.2 10*3/uL (ref 0.0–0.7)
Eosinophils Relative: 4.5 % (ref 0.0–5.0)
HCT: 42.4 % (ref 36.0–46.0)
Hemoglobin: 14 g/dL (ref 12.0–15.0)
Lymphocytes Relative: 30.7 % (ref 12.0–46.0)
Lymphs Abs: 1.6 10*3/uL (ref 0.7–4.0)
MCHC: 33.1 g/dL (ref 30.0–36.0)
MCV: 89.6 fl (ref 78.0–100.0)
Monocytes Absolute: 0.4 10*3/uL (ref 0.1–1.0)
Monocytes Relative: 6.7 % (ref 3.0–12.0)
Neutro Abs: 3 10*3/uL (ref 1.4–7.7)
Neutrophils Relative %: 57.6 % (ref 43.0–77.0)
Platelets: 171 10*3/uL (ref 150.0–400.0)
RBC: 4.73 Mil/uL (ref 3.87–5.11)
RDW: 13.2 % (ref 11.5–14.6)
WBC: 5.3 10*3/uL (ref 4.5–10.5)

## 2013-12-14 LAB — BASIC METABOLIC PANEL
BUN: 13 mg/dL (ref 6–23)
CO2: 28 mEq/L (ref 19–32)
Calcium: 9.7 mg/dL (ref 8.4–10.5)
Chloride: 105 mEq/L (ref 96–112)
Creatinine, Ser: 0.6 mg/dL (ref 0.4–1.2)
GFR: 130.43 mL/min (ref >60.00)
Glucose, Bld: 87 mg/dL (ref 70–99)
Potassium: 3.3 mEq/L — ABNORMAL LOW (ref 3.5–5.1)
Sodium: 140 mEq/L (ref 135–145)

## 2013-12-14 LAB — LIPID PANEL
Cholesterol: 130 mg/dL (ref 0–200)
HDL: 63.3 mg/dL (ref >39.00)
LDL Cholesterol: 55 mg/dL (ref 0–99)
Total CHOL/HDL Ratio: 2
Triglycerides: 59 mg/dL (ref 0.0–149.0)
VLDL: 11.8 mg/dL (ref 0.0–40.0)

## 2013-12-14 LAB — TSH: TSH: 1.6 u[IU]/mL (ref 0.35–5.50)

## 2013-12-15 ENCOUNTER — Telehealth: Payer: Self-pay | Admitting: Family Medicine

## 2013-12-15 NOTE — Telephone Encounter (Signed)
Pt returning your call, lab results?

## 2013-12-15 NOTE — Telephone Encounter (Signed)
I spoke with pt  

## 2013-12-15 NOTE — Telephone Encounter (Signed)
Updated medication in chart for Klor Con 10 meq take 1 po bid.

## 2013-12-21 ENCOUNTER — Encounter: Payer: Medicare Other | Admitting: Family Medicine

## 2013-12-23 ENCOUNTER — Telehealth: Payer: Self-pay | Admitting: Family Medicine

## 2013-12-23 NOTE — Telephone Encounter (Signed)
lmom to resched appointment due to inclement weather.

## 2014-01-05 ENCOUNTER — Encounter: Payer: Self-pay | Admitting: Family Medicine

## 2014-01-05 ENCOUNTER — Ambulatory Visit (INDEPENDENT_AMBULATORY_CARE_PROVIDER_SITE_OTHER): Payer: Medicare Other | Admitting: Family Medicine

## 2014-01-05 VITALS — BP 128/62 | HR 66 | Temp 99.2°F | Ht 67.0 in | Wt 166.0 lb

## 2014-01-05 DIAGNOSIS — Z0000 Encounter for general adult medical examination without abnormal findings: Secondary | ICD-10-CM

## 2014-01-05 HISTORY — PX: COLONOSCOPY: SHX174

## 2014-01-05 NOTE — Progress Notes (Signed)
Pre visit review using our clinic review tool, if applicable. No additional management support is needed unless otherwise documented below in the visit note. 

## 2014-01-05 NOTE — Progress Notes (Signed)
   Subjective:    Patient ID: Barbara [name], female    DOB: Oct 08, 1941, 73 y.o.   MRN: [id number]  HPI 73 yr old female for a cpx.  She feels well.    Review of Systems  Constitutional: Negative.   HENT: Negative.   Eyes: Negative.   Respiratory: Negative.   Cardiovascular: Negative.   Gastrointestinal: Negative.   Genitourinary: Negative for dysuria, urgency, frequency, hematuria, flank pain, decreased urine volume, enuresis, difficulty urinating, pelvic pain and dyspareunia.  Musculoskeletal: Negative.   Skin: Negative.   Neurological: Negative.   Psychiatric/Behavioral: Negative.        Objective:   Physical Exam  Constitutional: She is oriented to person, place, and time. She appears well-developed and well-nourished. No distress.  HENT:  Head: Normocephalic and atraumatic.  Right Ear: External ear normal.  Left Ear: External ear normal.  Nose: Nose normal.  Mouth/Throat: Oropharynx is clear and moist. No oropharyngeal exudate.  Eyes: Conjunctivae and EOM are normal. Pupils are equal, round, and reactive to light. No scleral icterus.  Neck: Normal range of motion. Neck supple. No JVD present. No thyromegaly present.  Cardiovascular: Normal rate, regular rhythm, normal heart sounds and intact distal pulses.  Exam reveals no gallop and no friction rub.   No murmur heard. Pulmonary/Chest: Effort normal and breath sounds normal. No respiratory distress. She has no wheezes. She has no rales. She exhibits no tenderness.  Abdominal: Soft. Bowel sounds are normal. She exhibits no distension and no mass. There is no tenderness. There is no rebound and no guarding.  Musculoskeletal: Normal range of motion. She exhibits no edema and no tenderness.  Lymphadenopathy:    She has no cervical adenopathy.  Neurological: She is alert and oriented to person, place, and time. She has normal reflexes. No cranial nerve deficit. She exhibits normal muscle tone. Coordination normal.    Skin: Skin is warm and dry. No rash noted. No erythema.  Psychiatric: She has a normal mood and affect. Her behavior is normal. Judgment and thought content normal.          Assessment & Plan:  Well exam.

## 2014-02-23 ENCOUNTER — Telehealth: Payer: Self-pay | Admitting: Family Medicine

## 2014-02-23 ENCOUNTER — Other Ambulatory Visit: Payer: Self-pay | Admitting: Family Medicine

## 2014-02-23 MED ORDER — POTASSIUM CHLORIDE ER 10 MEQ PO TBCR: EXTENDED_RELEASE_TABLET | ORAL | Status: DC

## 2014-02-23 NOTE — Telephone Encounter (Signed)
I left a voice message for pt to return my call and let me know if she needs a refill on Klor-Con and where to send it?

## 2014-02-23 NOTE — Telephone Encounter (Signed)
I spoke with pt, sent script e-scribe to [facility] and also called in a 2 week supply to [facility].

## 2014-06-20 ENCOUNTER — Telehealth: Payer: Self-pay | Admitting: Family Medicine

## 2014-06-20 NOTE — Telephone Encounter (Signed)
Pt is needing new rx furosemide (LASIX) 20 MG tablet, sent to [facility] [address]. [facility], but she is completely out and need meds now and the rest can be [facility].

## 2014-06-21 MED ORDER — FUROSEMIDE 20 MG PO TABS: 20.0000 mg | ORAL_TABLET | Freq: Every day | ORAL | Status: DC

## 2014-06-21 NOTE — Telephone Encounter (Signed)
Pt following up on med rx request. Pt states her ankles keep swelling.    furosemide (LASIX) 20 MG tablet [facility]

## 2014-06-21 NOTE — Telephone Encounter (Signed)
I sent script e-scribe and left a voice message for pt. 

## 2014-06-21 NOTE — Telephone Encounter (Signed)
Call in Lasix 20 mg daily for one year

## 2014-08-09 ENCOUNTER — Other Ambulatory Visit: Payer: Self-pay | Admitting: Cardiovascular Disease

## 2014-08-09 NOTE — Telephone Encounter (Signed)
Rx was sent to pharmacy electronically. OV 11/10

## 2014-08-24 ENCOUNTER — Ambulatory Visit (INDEPENDENT_AMBULATORY_CARE_PROVIDER_SITE_OTHER): Payer: Medicare Other

## 2014-08-24 DIAGNOSIS — Z23 Encounter for immunization: Secondary | ICD-10-CM

## 2014-09-07 ENCOUNTER — Encounter: Payer: Self-pay | Admitting: Cardiovascular Disease

## 2014-09-13 ENCOUNTER — Ambulatory Visit (INDEPENDENT_AMBULATORY_CARE_PROVIDER_SITE_OTHER): Payer: Medicare Other | Admitting: Cardiovascular Disease

## 2014-09-13 ENCOUNTER — Other Ambulatory Visit: Payer: Self-pay | Admitting: Cardiovascular Disease

## 2014-09-13 ENCOUNTER — Encounter: Payer: Self-pay | Admitting: Cardiovascular Disease

## 2014-09-13 VITALS — BP 130/60 | HR 45 | Ht 68.0 in | Wt 165.6 lb

## 2014-09-13 DIAGNOSIS — I2510 Atherosclerotic heart disease of native coronary artery without angina pectoris: Secondary | ICD-10-CM

## 2014-09-13 DIAGNOSIS — Z79899 Other long term (current) drug therapy: Secondary | ICD-10-CM

## 2014-09-13 DIAGNOSIS — R001 Bradycardia, unspecified: Secondary | ICD-10-CM

## 2014-09-13 DIAGNOSIS — I10 Essential (primary) hypertension: Secondary | ICD-10-CM

## 2014-09-13 DIAGNOSIS — R0602 Shortness of breath: Secondary | ICD-10-CM

## 2014-09-13 DIAGNOSIS — E785 Hyperlipidemia, unspecified: Secondary | ICD-10-CM

## 2014-09-13 NOTE — Progress Notes (Signed)
Patient ID: Barbara [name], female   DOB: 1941-10-06, 73 y.o.   MRN: [id number]     HPI: Barbara [name] is a 73 y.o. female who presents for 1 year cardiology evaluation.  Barbara [name] has established coronary artery disease and in November 2000 underwent stenting of a high-grade LAD stenosis. Her last nuclear perfusion study in November 2013 was unchanged from previously and continued to show normal perfusion without scar or ischemia. Ejection fraction was 68%. An echo Doppler study revealed an ejection fraction in the 55-60% range with grade 1 diastolic dysfunction. 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  Additional problems include hypertension as well as hyperlipidemia. Over the past several months she has noticed that she is more tired.  She also has noticed more shortness of breath with activity.  She has noted some vague indigestion symptoms.  She admits to some mild ankle swelling.  She has been taking Crestor 10 mg and denies myalgias.  She has been on losartan HCT 100/25 as well as amlodipine 10 mg and Lasix 20 mg for blood pressure and peripheral edema.  She denies chest pain.  She denies PND or orthopnea.  She presents for evaluation.  Past Medical History  Diagnosis Date  . Allergy   . Hyperlipidemia   . Hypertension   . CAD (coronary artery disease)     sees Dr. Shelva Majestic   . Colon polyps   . Osteoarthritis   . History of stress test     show normal perfusion without scar or ischemia, post EF 68%  . Hx of echocardiogram     show an EF 55%-60% range with grade 1 diastolic dysfunction, she had mitral anular calcification with mild MR, moderate LA dilation and mild pulmonary hypertension with a PA estimated pressure of 77mm    Past Surgical History  Procedure Laterality Date  . Abdominal hysterectomy  1971  . Coronary stent placement  2000    in LAD  .  Colonoscopy  04-28-09    per Dr. Teena Irani, clear, repeat in 5 yrs     Allergies  Allergen Reactions  . Morphine   . Procaine Hcl     rash  . Sulfonamide Derivatives   . Vytorin [Ezetimibe-Simvastatin]     Current Outpatient Prescriptions  Medication Sig Dispense Refill  . amLODipine (NORVASC) 10 MG tablet TAKE 1 TABLET DAILY 90 tablet 0  . aspirin 81 MG tablet Take 81 mg by mouth daily. Take 2 tablets every morning    . CRESTOR 10 MG tablet TAKE 1 TABLET (10 MG TOTAL) DAILY 90 tablet 0  . diclofenac (VOLTAREN) 75 MG EC tablet TAKE ONE TABLET BY MOUTH TWICE DAILY AS NEEDED FOR PAIN 60 tablet 6  . furosemide (LASIX) 20 MG tablet Take 1 tablet (20 mg total) by mouth daily. 30 tablet 11  . HYDROcodone-homatropine (HYDROMET) 5-1.5 MG/5ML syrup Take 5 mLs by mouth every 4 (four) hours as needed for cough. 240 mL 0  . loratadine (CLARITIN) 10 MG tablet Take 10 mg by mouth daily.    Marland Kitchen losartan-hydrochlorothiazide (HYZAAR) 100-25 MG per tablet Take 1 tablet by mouth daily. 90 tablet 3  . Multiple Vitamin (MULTIVITAMINS PO) Take by mouth.      . potassium chloride (KLOR-CON 10) 10 MEQ tablet TAKE 1 TABLET twice a day (Patient taking differently: Take by mouth 2 (two) times daily. TAKE 2 TABLET twice a day)  180 tablet 3  . triamcinolone (NASACORT) 55 MCG/ACT nasal inhaler Place 1 spray into both nostrils as needed.      Current Facility-Administered Medications  Medication Dose Route Frequency Provider Last Rate Last Dose  . cefTRIAXone (ROCEPHIN) injection 500 mg  500 mg Intramuscular Q24H Laurey Morale, MD   500 mg at 12/21/10 1642    History   Social History  . Marital Status: Married    Spouse Name: N/A    Number of Children: N/A  . Years of Education: N/A   Occupational History  . Not on file.   Social History Main Topics  . Smoking status: Former Smoker    Quit date: 11/04/1996  . Smokeless tobacco: Never Used  . Alcohol Use: No  . Drug Use: No  . Sexual Activity: Not on  file   Other Topics Concern  . Not on file   Social History Narrative    Family History  Problem Relation Age of Onset  . Cancer Maternal Grandmother   . Heart disease Maternal Grandfather     ROS General: Negative; No fevers, chills, or night sweats;  HEENT: Negative; No changes in vision or hearing, sinus congestion, difficulty swallowing Pulmonary: Negative; No cough, wheezing, hemoptysis Cardiovascular: the history of present illness.  Mild exertional shortness of breath GI: Negative; No nausea, vomiting, diarrhea, or abdominal pain GU: Negative; No dysuria, hematuria, or difficulty voiding Musculoskeletal: history of plantar fasciitis; no myalgias, joint pain, or weakness Hematologic/Oncology: Negative; no easy bruising, bleeding Endocrine: Negative; no heat/cold intolerance; no diabetes Neuro: Negative; no changes in balance, headaches Skin: Negative; No rashes or skin lesions Psychiatric: Negative; No behavioral problems, depression Sleep: Negative; No snoring, daytime sleepiness, hypersomnolence, bruxism, restless legs, hypnogognic hallucinations, no cataplexy Other comprehensive 14 point system review is negative.   PE BP 130/60 mmHg  Pulse 45  Ht 5\' 8"  (1.727 m)  Wt 165 lb 9.6 oz (75.116 kg)  BMI 25.19 kg/m2  General: Alert, oriented, no distress.  Skin: normal turgor, no rashes HEENT: Normocephalic, atraumatic. Pupils round and reactive; sclera anicteric;no lid lag.  Nose without nasal septal hypertrophy Mouth/Parynx benign; Mallinpatti scale 2  Neck: No JVD, no carotid bruits with normal carotid upstroke Lungs: clear to ausculatation and percussion; no wheezing or rales Chest wall: Nontender to palpation Heart: RRR, s1 s2 normal 2/6 systolic murmur, .  No diastolic murmur.  No S3 gallop; .  No rubs thrills or heaves Abdomen: soft, nontender; no hepatosplenomehaly, BS+; abdominal aorta nontender and not dilated by palpation. Back: No CVA tenderness Pulses  2+ Extremities: no clubbing cyanosis or edema, Homan's sign negative  Neurologic: grossly nonfocal Psychologic: normal affect and mood.  ECG (independently read by me): Marked sinus bradycardia at 45 bpm with LVH and diffuse T-wave inversion II, III, and F lead 1, and V3 through V6; minimally changed from one year previously.  Prior November 2014 ECG: Sinus bradycardia with sinus arrhythmia. Previously noted LVH with marked repolarization T-wave abnormalities which have been present previously.  LABS:  BMET    Component Value Date/Time   NA 140 12/14/2013 0919   K 3.3* 12/14/2013 0919   CL 105 12/14/2013 0919   CO2 28 12/14/2013 0919   GLUCOSE 87 12/14/2013 0919   BUN 13 12/14/2013 0919   CREATININE 0.6 12/14/2013 0919   CALCIUM 9.7 12/14/2013 0919   GFRNONAA 119.60 10/17/2010 0939     Hepatic Function Panel     Component Value Date/Time   PROT 7.2 12/14/2013 0919  ALBUMIN 4.0 12/14/2013 0919   AST 25 12/14/2013 0919   ALT 21 12/14/2013 0919   ALKPHOS 61 12/14/2013 0919   BILITOT 0.7 12/14/2013 0919   BILIDIR 0.1 12/14/2013 0919     CBC    Component Value Date/Time   WBC 5.3 12/14/2013 0919   RBC 4.73 12/14/2013 0919   HGB 14.0 12/14/2013 0919   HCT 42.4 12/14/2013 0919   PLT 171.0 12/14/2013 0919   MCV 89.6 12/14/2013 0919   MCHC 33.1 12/14/2013 0919   RDW 13.2 12/14/2013 0919   LYMPHSABS 1.6 12/14/2013 0919   MONOABS 0.4 12/14/2013 0919   EOSABS 0.2 12/14/2013 0919   BASOSABS 0.0 12/14/2013 0919     BNP No results found for: PROBNP  Lipid Panel     Component Value Date/Time   CHOL 130 12/14/2013 0919   TRIG 59.0 12/14/2013 0919   HDL 63.30 12/14/2013 0919   CHOLHDL 2 12/14/2013 0919   VLDL 11.8 12/14/2013 0919   LDLCALC 55 12/14/2013 0919     RADIOLOGY: No results found.    ASSESSMENT AND PLAN:  Ms. Benning is now 15 years status post stenting of a high-grade proximal LAD stenosis with insertion of an S670 3.5x18 mm bare-metal  stent. Her blood pressure today is controlled.  She is not on any rate slowing medications, but does have significant rated cardia suggesting possibility of chronotropic incompetence, which may be playing a role in her exertional shortness of breath.  However, with her significant ECG changes.  I am recommending follow-up echo Doppler evaluation as well as a Myoview scan.  I discussed the possibility of doing this in exercise format, but she does not feel that she can keep up with the treadmill and prefers pharmacologic nuclear imaging.  She does have a 2/6 systolic murmur today which has progressed over the past year.  Laboratory will be obtained in the fasting state.  Target LDL is less than 70.  Medications will be adjusted if necessary depending upon blood work results.  I will see her in the office in follow-up in 2-3 months.   Troy Sine, MD, [facility]  09/13/2014 12:29 PM

## 2014-09-13 NOTE — Patient Instructions (Addendum)
Your physician recommends that you return for lab work  Fasting.  Your physician recommends that you schedule a follow-up appointment in: 2-3 months.  Your physician has requested that you have a lexiscan myoview. For further information please visit [email or web address]. Please follow instruction sheet, as given.  Your physician has requested that you have an echocardiogram. Echocardiography is a painless test that uses sound waves to create images of your heart. It provides your doctor with information about the size and shape of your heart and how well your heart's chambers and valves are working. This procedure takes approximately one hour. There are no restrictions for this procedure.

## 2014-09-27 ENCOUNTER — Other Ambulatory Visit (HOSPITAL_COMMUNITY): Payer: Self-pay | Admitting: Cardiovascular Disease

## 2014-09-27 DIAGNOSIS — R0602 Shortness of breath: Secondary | ICD-10-CM

## 2014-10-04 LAB — COMPREHENSIVE METABOLIC PANEL
ALT: 19 U/L (ref 0–35)
AST: 17 U/L (ref 0–37)
Albumin: 4.1 g/dL (ref 3.5–5.2)
Alkaline Phosphatase: 72 U/L (ref 39–117)
BUN: 16 mg/dL (ref 6–23)
CO2: 29 mEq/L (ref 19–32)
Calcium: 9.7 mg/dL (ref 8.4–10.5)
Chloride: 102 mEq/L (ref 96–112)
Creat: 0.74 mg/dL (ref 0.50–1.10)
Glucose, Bld: 90 mg/dL (ref 70–99)
Potassium: 3.9 mEq/L (ref 3.5–5.3)
Sodium: 139 mEq/L (ref 135–145)
Total Bilirubin: 0.5 mg/dL (ref 0.2–1.2)
Total Protein: 6.8 g/dL (ref 6.0–8.3)

## 2014-10-04 LAB — LIPID PANEL
Cholesterol: 140 mg/dL (ref 0–200)
HDL: 62 mg/dL (ref >39)
LDL Cholesterol: 60 mg/dL (ref 0–99)
Total CHOL/HDL Ratio: 2.3 Ratio
Triglycerides: 89 mg/dL (ref <150)
VLDL: 18 mg/dL (ref 0–40)

## 2014-10-04 LAB — CBC
HCT: 40.6 % (ref 36.0–46.0)
Hemoglobin: 13.8 g/dL (ref 12.0–15.0)
MCH: 29.1 pg (ref 26.0–34.0)
MCHC: 34 g/dL (ref 30.0–36.0)
MCV: 85.7 fL (ref 78.0–100.0)
Platelets: 208 10*3/uL (ref 150–400)
RBC: 4.74 MIL/uL (ref 3.87–5.11)
RDW: 13.3 % (ref 11.5–15.5)
WBC: 4.4 10*3/uL (ref 4.0–10.5)

## 2014-10-04 LAB — TSH: TSH: 2.01 u[IU]/mL (ref 0.350–4.500)

## 2014-10-06 ENCOUNTER — Ambulatory Visit (HOSPITAL_COMMUNITY)
Admission: RE | Admit: 2014-10-06 | Discharge: 2014-10-06 | Disposition: A | Discharge status: Home / Self Care | Payer: Medicare Other | Source: Ambulatory Visit | Attending: Cardiovascular Disease | Admitting: Cardiovascular Disease

## 2014-10-06 DIAGNOSIS — I10 Essential (primary) hypertension: Secondary | ICD-10-CM | POA: Diagnosis not present

## 2014-10-06 DIAGNOSIS — Z8249 Family history of ischemic heart disease and other diseases of the circulatory system: Secondary | ICD-10-CM | POA: Diagnosis not present

## 2014-10-06 DIAGNOSIS — R9431 Abnormal electrocardiogram [ECG] [EKG]: Secondary | ICD-10-CM | POA: Diagnosis not present

## 2014-10-06 DIAGNOSIS — Z79899 Other long term (current) drug therapy: Secondary | ICD-10-CM

## 2014-10-06 DIAGNOSIS — R0609 Other forms of dyspnea: Secondary | ICD-10-CM | POA: Insufficient documentation

## 2014-10-06 DIAGNOSIS — R0602 Shortness of breath: Secondary | ICD-10-CM

## 2014-10-06 DIAGNOSIS — R002 Palpitations: Secondary | ICD-10-CM | POA: Diagnosis not present

## 2014-10-06 DIAGNOSIS — R079 Chest pain, unspecified: Secondary | ICD-10-CM | POA: Insufficient documentation

## 2014-10-06 DIAGNOSIS — I2510 Atherosclerotic heart disease of native coronary artery without angina pectoris: Secondary | ICD-10-CM | POA: Diagnosis not present

## 2014-10-06 DIAGNOSIS — R5383 Other fatigue: Secondary | ICD-10-CM | POA: Insufficient documentation

## 2014-10-06 DIAGNOSIS — I059 Rheumatic mitral valve disease, unspecified: Secondary | ICD-10-CM

## 2014-10-06 DIAGNOSIS — E785 Hyperlipidemia, unspecified: Secondary | ICD-10-CM | POA: Insufficient documentation

## 2014-10-06 MED ORDER — REGADENOSON 0.4 MG/5ML IV SOLN
0.4000 mg | Freq: Once | INTRAVENOUS | Status: AC
  Administered 2014-10-06: 0.4 mg via INTRAVENOUS

## 2014-10-06 MED ORDER — TECHNETIUM TC 99M SESTAMIBI GENERIC - CARDIOLITE
30.0000 | Freq: Once | INTRAVENOUS | Status: AC | PRN
  Administered 2014-10-06: 30 via INTRAVENOUS

## 2014-10-06 MED ORDER — TECHNETIUM TC 99M SESTAMIBI GENERIC - CARDIOLITE
10.0000 | Freq: Once | INTRAVENOUS | Status: AC | PRN
  Administered 2014-10-06: 10 via INTRAVENOUS

## 2014-10-06 NOTE — Procedures (Addendum)
[facility] [address] [place] [address] [phone]  Cardiology Nuclear Med Study  Barbara [name] is a 73 y.o. female     MRN : [id number]     DOB: 28-Mar-1941  Procedure Date: 10/06/2014  Nuclear Med Background Indication for Stress Test:  Stent Patency and Abnormal EKG History:  CAD;STENT/PTCA-2000;Last NUC MPI on 09/23/2012-nonischemic;EF=68% Cardiac Risk Factors: Family History - CAD, History of Smoking, Hypertension and Lipids  Symptoms:  Chest Pain, DOE, Fatigue and Palpitations   Nuclear Pre-Procedure Caffeine/Decaff Intake:  9:00pm NPO After: 7:00am   IV Site: R Forearm  IV 0.9% NS with Angio Cath:  22g  Chest Size (in):  n/a IV Started by: Rolene Course, RN  Height: 5\' 8"  (1.727 m)  Cup Size: C  BMI:  Body mass index is 25.09 kg/(m^2). Weight:  165 lb (74.844 kg)   Tech Comments:  n/a    Nuclear Med Study 1 or 2 day study: 1 day  Stress Test Type:  [place] Provider:  Shelva Majestic, MD   Resting Radionuclide: Technetium 16m Sestamibi  Resting Radionuclide Dose: 10.8 mCi   Stress Radionuclide:  Technetium 61m Sestamibi  Stress Radionuclide Dose: 29.8 mCi           Stress Protocol Rest HR: 51 Stress HR: 59  Rest BP: 178/74 Stress BP: 148/59  Exercise Time (min): n/a METS: n/a   Predicted Max HR: 145 bpm % Max HR: 46.9 bpm Rate Pressure Product: 12104  Dose of Adenosine (mg):  n/a Dose of Lexiscan: 0.4 mg  Dose of Atropine (mg): n/a Dose of Dobutamine: n/a mcg/kg/min (at max HR)  Stress Test Technologist: Leane Para, CCT Nuclear Technologist: Otho Perl, CNMT   Rest Procedure:  Myocardial perfusion imaging was performed at rest 45 minutes following the intravenous administration of Technetium 102m Sestamibi. Stress Procedure:  The patient received IV Lexiscan 0.4 mg over 15-seconds.  Technetium 41m Sestamibi injected IV at 30-seconds.  There were no significant  changes with Lexiscan.  Quantitative spect images were obtained after a 45 minute delay.  Transient Ischemic Dilatation (Normal <1.22):  1.17 QGS EDV:  85 ml QGS ESV:  28 ml LV Ejection Fraction: 67%   Rest ECG: sinus bradycardia, LVH with prominent repolarization changes  Stress ECG: a 6-beat run of atrial tachycardia was seen, otherwise no changes  QPS Raw Data Images:  Normal; no motion artifact; normal heart/lung ratio. Stress Images:  Normal homogeneous uptake in all areas of the myocardium. Rest Images:  Normal homogeneous uptake in all areas of the myocardium. Subtraction (SDS):  No evidence of ischemia.  Impression Exercise Capacity:  Lexiscan with no exercise. BP Response:  Normal blood pressure response. Clinical Symptoms:  No significant symptoms noted. ECG Impression:  No significant ECG changes with Lexiscan. Comparison with Prior Nuclear Study: No significant change from previous study  Overall Impression:  Normal stress nuclear study.  LV Wall Motion:  NL LV Function; NL Wall Motion, EF 67%   [name], MD  10/06/2014 12:42 PM

## 2014-10-06 NOTE — Progress Notes (Signed)
2D Echo Performed 10/06/2014    Marygrace Drought, RCS

## 2014-10-13 ENCOUNTER — Encounter: Payer: Self-pay | Admitting: *Deleted

## 2014-10-19 ENCOUNTER — Other Ambulatory Visit: Payer: Self-pay | Admitting: Cardiovascular Disease

## 2014-10-24 ENCOUNTER — Encounter: Payer: Self-pay | Admitting: *Deleted

## 2014-11-07 ENCOUNTER — Other Ambulatory Visit: Payer: Self-pay | Admitting: Cardiovascular Disease

## 2014-11-07 NOTE — Telephone Encounter (Signed)
Rx(s) sent to pharmacy electronically.  

## 2014-12-07 ENCOUNTER — Telehealth: Payer: Self-pay | Admitting: Cardiovascular Disease

## 2014-12-07 NOTE — Telephone Encounter (Signed)
Spoke with Barbara [name], patient is needing MRI and they wanted to know about her stents. Patients stents wee placed in 2000. Okay given for MRI.

## 2014-12-12 ENCOUNTER — Other Ambulatory Visit: Payer: Self-pay | Admitting: Cardiovascular Disease

## 2014-12-12 NOTE — Telephone Encounter (Signed)
Rx(s) sent to pharmacy electronically.  

## 2014-12-21 ENCOUNTER — Ambulatory Visit (INDEPENDENT_AMBULATORY_CARE_PROVIDER_SITE_OTHER): Payer: 59 | Admitting: Cardiovascular Disease

## 2014-12-21 ENCOUNTER — Encounter: Payer: Self-pay | Admitting: Cardiovascular Disease

## 2014-12-21 VITALS — BP 140/74 | HR 52 | Ht 68.0 in | Wt 168.7 lb

## 2014-12-21 DIAGNOSIS — R001 Bradycardia, unspecified: Secondary | ICD-10-CM

## 2014-12-21 DIAGNOSIS — R0609 Other forms of dyspnea: Secondary | ICD-10-CM

## 2014-12-21 DIAGNOSIS — Z01818 Encounter for other preprocedural examination: Secondary | ICD-10-CM

## 2014-12-21 DIAGNOSIS — E785 Hyperlipidemia, unspecified: Secondary | ICD-10-CM

## 2014-12-21 DIAGNOSIS — R0600 Dyspnea, unspecified: Secondary | ICD-10-CM

## 2014-12-21 DIAGNOSIS — I10 Essential (primary) hypertension: Secondary | ICD-10-CM

## 2014-12-21 NOTE — Patient Instructions (Signed)
Your physician wants you to follow-up in 1 year with Dr. Claiborne Billings. You will receive a reminder letter in the mail 2 months in advance. If you do not receive a letter, please call our office to schedule the follow-up appointment.

## 2014-12-23 ENCOUNTER — Encounter: Payer: Self-pay | Admitting: Cardiovascular Disease

## 2014-12-23 DIAGNOSIS — R0609 Other forms of dyspnea: Secondary | ICD-10-CM | POA: Insufficient documentation

## 2014-12-23 DIAGNOSIS — Z01818 Encounter for other preprocedural examination: Secondary | ICD-10-CM | POA: Insufficient documentation

## 2014-12-23 DIAGNOSIS — R0600 Dyspnea, unspecified: Secondary | ICD-10-CM | POA: Insufficient documentation

## 2014-12-23 NOTE — Progress Notes (Signed)
Patient ID: Barbara [name], female   DOB: Apr 04, 1941, 74 y.o.   MRN: [id number]     HPI: Barbara [name] is a 74 y.o. female who presents for follow-up cardiology evaluation.  Barbara [name] has established coronary artery disease and in November 2000 underwent stenting of a high-grade LAD stenosis with an S670 3.518 mm bare-metal stent. A nuclear perfusion study in November 2013 was unchanged from previously and continued to show normal perfusion without scar or ischemia. Ejection fraction was 68%. An echo Doppler study revealed an ejection fraction in the 55-60% range with grade 1 diastolic dysfunction. 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  Additional problems include hypertension as well as hyperlipidemia. Over the past several months she has noticed that she is more tired.  She also has noticed more shortness of breath with activity.  She has noted some vague indigestion symptoms.  She admits to some mild ankle swelling.  She has been taking Crestor 10 mg and denies myalgias.  She has been on losartan HCT 100/25 as well as amlodipine 10 mg and Lasix 20 mg for blood pressure and peripheral edema.  When I saw her in November.  Her blood pressure was controlled.  She was bradycardic not on any rate control medication and I raise the possibility of a component of chronotropic incompetence.  To further evaluate her exertional dyspnea.  She underwent a nuclear perfusion study on 10/06/2014 which was normal.  Post stress ejection fraction was 67%.  An echo Doppler study done on 10/06/2014 showed an EF of 60-65% with moderate left ventricular hypertrophy.  There was grade 1 diastolic dysfunction.  She had indeterminate LV filling pressure.  There was mild aortic sclerosis without stenosis, mitral annular calcification with mild MR, and mild dilatation of her left atrium with mild tricuspid  regurgitation.  Bony pressures were minimally elevated at 31 mm.  Presently, Barbara [name] denies any episodes of chest pain.  She has continued to have difficulty with her left knee being swollen.  She recently had an MRI and is scheduled to see Dr. Gladstone Lighter and may require knee surgery.  She denies chest pain.  She denies PND or orthopnea.  She presents for evaluation.  Past Medical History  Diagnosis Date  . Allergy   . Hyperlipidemia   . Hypertension   . CAD (coronary artery disease)     sees Dr. Shelva Majestic   . Colon polyps   . Osteoarthritis   . History of stress test     show normal perfusion without scar or ischemia, post EF 68%  . Hx of echocardiogram     show an EF 55%-60% range with grade 1 diastolic dysfunction, she had mitral anular calcification with mild MR, moderate LA dilation and mild pulmonary hypertension with a PA estimated pressure of 66mm    Past Surgical History  Procedure Laterality Date  . Abdominal hysterectomy  1971  . Coronary stent placement  2000    in LAD  . Colonoscopy  04-28-09    per Dr. Teena Irani, clear, repeat in 5 yrs     Allergies  Allergen Reactions  . Morphine   . Procaine Hcl     rash  . Sulfonamide Derivatives   . Vytorin [Ezetimibe-Simvastatin]     Current Outpatient Prescriptions  Medication Sig Dispense Refill  . amLODipine (NORVASC) 10 MG tablet Take 1 tablet (10 mg total) by mouth daily. [place]  tablet 2  . aspirin 81 MG tablet Take 81 mg by mouth daily. Take 2 tablets every morning    . diclofenac (VOLTAREN) 75 MG EC tablet TAKE ONE TABLET BY MOUTH TWICE DAILY AS NEEDED FOR PAIN 60 tablet 6  . furosemide (LASIX) 20 MG tablet Take 1 tablet (20 mg total) by mouth daily. 30 tablet 11  . HYDROcodone-homatropine (HYDROMET) 5-1.5 MG/5ML syrup Take 5 mLs by mouth every 4 (four) hours as needed for cough. 240 mL 0  . loratadine (CLARITIN) 10 MG tablet Take 10 mg by mouth daily.    Marland Kitchen losartan-hydrochlorothiazide (HYZAAR) 100-25 MG per  tablet TAKE 1 TABLET DAILY 90 tablet 2  . Multiple Vitamin (MULTIVITAMINS PO) Take by mouth.      . potassium chloride (KLOR-CON 10) 10 MEQ tablet TAKE 1 TABLET twice a day (Patient taking differently: Take by mouth 2 (two) times daily. TAKE 2 TABLET twice a day) 180 tablet 3  . rosuvastatin (CRESTOR) 10 MG tablet Take 1 tablet (10 mg total) by mouth daily. 90 tablet 2  . triamcinolone (NASACORT) 55 MCG/ACT nasal inhaler Place 1 spray into both nostrils as needed.      Current Facility-Administered Medications  Medication Dose Route Frequency Provider Last Rate Last Dose  . cefTRIAXone (ROCEPHIN) injection 500 mg  500 mg Intramuscular Q24H Laurey Morale, MD   500 mg at 12/21/10 1642    History   Social History  . Marital Status: Married    Spouse Name: N/A  . Number of Children: N/A  . Years of Education: N/A   Occupational History  . Not on file.   Social History Main Topics  . Smoking status: Former Smoker    Quit date: 11/04/1996  . Smokeless tobacco: Never Used  . Alcohol Use: No  . Drug Use: No  . Sexual Activity: Not on file   Other Topics Concern  . Not on file   Social History Narrative    Family History  Problem Relation Age of Onset  . Cancer Maternal Grandmother   . Heart disease Maternal Grandfather     ROS General: Negative; No fevers, chills, or night sweats;  HEENT: Negative; No changes in vision or hearing, sinus congestion, difficulty swallowing Pulmonary: Negative; No cough, wheezing, hemoptysis Cardiovascular: the history of present illness.  Mild exertional shortness of breath GI: Negative; No nausea, vomiting, diarrhea, or abdominal pain GU: Negative; No dysuria, hematuria, or difficulty voiding Musculoskeletal: history of plantar fasciitis; no myalgias, joint pain, or weakness Hematologic/Oncology: Negative; no easy bruising, bleeding Endocrine: Negative; no heat/cold intolerance; no diabetes Neuro: Negative; no changes in balance,  headaches Skin: Negative; No rashes or skin lesions Psychiatric: Negative; No behavioral problems, depression Sleep: Negative; No snoring, daytime sleepiness, hypersomnolence, bruxism, restless legs, hypnogognic hallucinations, no cataplexy Other comprehensive 14 point system review is negative.   PE BP 140/74 mmHg  Pulse 52  Ht 5\' 8"  (1.727 m)  Wt 168 lb 11.2 oz (76.522 kg)  BMI 25.66 kg/m2  General: Alert, oriented, no distress.  Skin: normal turgor, no rashes HEENT: Normocephalic, atraumatic. Pupils round and reactive; sclera anicteric;no lid lag.  Nose without nasal septal hypertrophy Mouth/Parynx benign; Mallinpatti scale 2  Neck: No JVD, no carotid bruits with normal carotid upstroke Lungs: clear to ausculatation and percussion; no wheezing or rales Chest wall: Nontender to palpation Heart: RRR, s1 s2 normal 2/6 systolic murmur, .  No diastolic murmur.  No S3 gallop; .  No rubs thrills or heaves Abdomen: soft, nontender;  no hepatosplenomehaly, BS+; abdominal aorta nontender and not dilated by palpation. Back: No CVA tenderness Pulses 2+ Extremities: Left knee swollen; no clubbing cyanosis, Homan's sign negative  Neurologic: grossly nonfocal Psychologic: normal affect and mood.  ECG (independently read by me): Marked sinus bradycardia at 45 bpm with LVH and diffuse T-wave inversion II, III, and F lead 1, and V3 through V6; minimally changed from one year previously.  Prior November 2014 ECG: Sinus bradycardia with sinus arrhythmia. Previously noted LVH with marked repolarization T-wave abnormalities which have been present previously.  LABS:  BMET    Component Value Date/Time   NA 139 10/04/2014 0851   K 3.9 10/04/2014 0851   CL 102 10/04/2014 0851   CO2 29 10/04/2014 0851   GLUCOSE 90 10/04/2014 0851   BUN 16 10/04/2014 0851   CREATININE 0.74 10/04/2014 0851   CREATININE 0.6 12/14/2013 0919   CALCIUM 9.7 10/04/2014 0851   GFRNONAA 119.60 10/17/2010 0939      Hepatic Function Panel     Component Value Date/Time   PROT 6.8 10/04/2014 0851   ALBUMIN 4.1 10/04/2014 0851   AST 17 10/04/2014 0851   ALT 19 10/04/2014 0851   ALKPHOS 72 10/04/2014 0851   BILITOT 0.5 10/04/2014 0851   BILIDIR 0.1 12/14/2013 0919     CBC    Component Value Date/Time   WBC 4.4 10/04/2014 0851   RBC 4.74 10/04/2014 0851   HGB 13.8 10/04/2014 0851   HCT 40.6 10/04/2014 0851   PLT 208 10/04/2014 0851   MCV 85.7 10/04/2014 0851   MCH 29.1 10/04/2014 0851   MCHC 34.0 10/04/2014 0851   RDW 13.3 10/04/2014 0851   LYMPHSABS 1.6 12/14/2013 0919   MONOABS 0.4 12/14/2013 0919   EOSABS 0.2 12/14/2013 0919   BASOSABS 0.0 12/14/2013 0919     BNP No results found for: PROBNP  Lipid Panel     Component Value Date/Time   CHOL 140 10/04/2014 0851   TRIG 89 10/04/2014 0851   HDL 62 10/04/2014 0851   CHOLHDL 2.3 10/04/2014 0851   VLDL 18 10/04/2014 0851   LDLCALC 60 10/04/2014 0851     RADIOLOGY: No results found.    ASSESSMENT AND PLAN:  Barbara [name] is 14 years status post stenting of a high-grade proximal LAD stenosis with insertion of an S670 3.5x18 mm bare-metal stent. Her blood pressure today is controlled and on repeat by me was 122/70 on her current regimen consisting of amlodipine 10 mg furosemide 20 mg losartan HCT 100/25 mg..  She is not on any rate slowing medications, but does have bradycardia suggesting possible chronotropic incompetence, which may be playing a role in her exertional shortness of breath.  I reviewed her most recent nuclear perfusion study, which continues to show normal perfusion and argues against CAD progression, scar/ischemia.  Her echo Doppler study confirms normal systolic function with grade 1 diastolic dysfunction which may be contributory to her exertional dyspnea.  I did review her laboratory from 10/04/2014.  Her lipids are excellent with an LDL cholesterol at 60 on Crestor 10 mg daily.  She may require knee  meniscectomy.  She is scheduled to see Dr. Gladstone Lighter for final recommendations.  She will be cleared for surgery for this procedure from a cardiovascular standpoint.  As long as she is stable, I will see her in one year for cardiology reevaluation or sooner if problem arise.  Troy Sine, MD, [facility]  12/23/2014 6:32 PM

## 2015-01-02 ENCOUNTER — Other Ambulatory Visit: Payer: Self-pay | Admitting: Surgical

## 2015-01-02 NOTE — Patient Instructions (Addendum)
Barbara [name]  01/02/2015   Your procedure is scheduled on: 01/06/2015    Report to [facility] Main  Entrance and follow signs to               [place] at       1030 AM.  Call this number if you have problems the morning of surgery [phone]   Remember:  Do not eat food or drink liquids :After Midnight.     Take these medicines the morning of surgery with A SIP OF WATER: Amlodipine ( Norvasc)                               You may not have any metal on your body including hair pins and              piercings  Do not wear jewelry, make-up, lotions, powders or perfumes., deodorant.               Do not wear nail polish.  Do not shave  48 hours prior to surgery.                 Do not bring valuables to the hospital. [place].  Contacts, dentures or bridgework may not be worn into surgery.       Patients discharged the day of surgery will not be allowed to drive home.  Name and phone number of your driver:  Special Instructions: coughing and deep breathing exercises, leg exercises               Please read over the following fact sheets you were given: _____________________________________________________________________             [facility] - Preparing for Surgery Before surgery, you can play an important role.  Because skin is not sterile, your skin needs to be as free of germs as possible.  You can reduce the number of germs on your skin by washing with CHG (chlorahexidine gluconate) soap before surgery.  CHG is an antiseptic cleaner which kills germs and bonds with the skin to continue killing germs even after washing. Please DO NOT use if you have an allergy to CHG or antibacterial soaps.  If your skin becomes reddened/irritated stop using the CHG and inform your nurse when you arrive at Short Stay. Do not shave (including legs and underarms) for at least 48 hours prior to the first  CHG shower.  You may shave your face/neck. Please follow these instructions carefully:  1.  Shower with CHG Soap the night before surgery and the  morning of Surgery.  2.  If you choose to wash your hair, wash your hair first as usual with your  normal  shampoo.  3.  After you shampoo, rinse your hair and body thoroughly to remove the  shampoo.                           4.  Use CHG as you would any other liquid soap.  You can apply chg directly  to the skin and wash  Gently with a scrungie or clean washcloth.  5.  Apply the CHG Soap to your body ONLY FROM THE NECK DOWN.   Do not use on face/ open                           Wound or open sores. Avoid contact with eyes, ears mouth and genitals (private parts).                       Wash face,  Genitals (private parts) with your normal soap.             6.  Wash thoroughly, paying special attention to the area where your surgery  will be performed.  7.  Thoroughly rinse your body with warm water from the neck down.  8.  DO NOT shower/wash with your normal soap after using and rinsing off  the CHG Soap.                9.  Pat yourself dry with a clean towel.            10.  Wear clean pajamas.            11.  Place clean sheets on your bed the night of your first shower and do not  sleep with pets. Day of Surgery : Do not apply any lotions/deodorants the morning of surgery.  Please wear clean clothes to the hospital/surgery center.  FAILURE TO FOLLOW THESE INSTRUCTIONS MAY RESULT IN THE CANCELLATION OF YOUR SURGERY PATIENT SIGNATURE_________________________________  NURSE SIGNATURE__________________________________  ________________________________________________________________________

## 2015-01-02 NOTE — Progress Notes (Signed)
Please put orders in Epic surgery 01-06-15 pre op 01-03-15 Thanks

## 2015-01-03 ENCOUNTER — Encounter (HOSPITAL_COMMUNITY)
Admission: RE | Admit: 2015-01-03 | Discharge: 2015-01-03 | Disposition: A | Discharge status: Home / Self Care | Payer: Medicare Other | Source: Ambulatory Visit | Attending: Orthopedic Surgery | Admitting: Orthopedic Surgery

## 2015-01-03 ENCOUNTER — Encounter (HOSPITAL_COMMUNITY): Payer: Self-pay

## 2015-01-03 DIAGNOSIS — S83282A Other tear of lateral meniscus, current injury, left knee, initial encounter: Secondary | ICD-10-CM | POA: Diagnosis present

## 2015-01-03 DIAGNOSIS — Z7982 Long term (current) use of aspirin: Secondary | ICD-10-CM | POA: Diagnosis not present

## 2015-01-03 DIAGNOSIS — Z882 Allergy status to sulfonamides status: Secondary | ICD-10-CM | POA: Diagnosis not present

## 2015-01-03 DIAGNOSIS — M179 Osteoarthritis of knee, unspecified: Secondary | ICD-10-CM | POA: Diagnosis not present

## 2015-01-03 DIAGNOSIS — Z888 Allergy status to other drugs, medicaments and biological substances status: Secondary | ICD-10-CM | POA: Diagnosis not present

## 2015-01-03 DIAGNOSIS — Z885 Allergy status to narcotic agent status: Secondary | ICD-10-CM | POA: Diagnosis not present

## 2015-01-03 DIAGNOSIS — Z87891 Personal history of nicotine dependence: Secondary | ICD-10-CM | POA: Diagnosis not present

## 2015-01-03 DIAGNOSIS — E785 Hyperlipidemia, unspecified: Secondary | ICD-10-CM | POA: Diagnosis not present

## 2015-01-03 DIAGNOSIS — I2510 Atherosclerotic heart disease of native coronary artery without angina pectoris: Secondary | ICD-10-CM | POA: Diagnosis not present

## 2015-01-03 DIAGNOSIS — Y9389 Activity, other specified: Secondary | ICD-10-CM | POA: Diagnosis not present

## 2015-01-03 DIAGNOSIS — I10 Essential (primary) hypertension: Secondary | ICD-10-CM | POA: Diagnosis not present

## 2015-01-03 DIAGNOSIS — S83242A Other tear of medial meniscus, current injury, left knee, initial encounter: Secondary | ICD-10-CM | POA: Diagnosis not present

## 2015-01-03 DIAGNOSIS — Z79899 Other long term (current) drug therapy: Secondary | ICD-10-CM | POA: Diagnosis not present

## 2015-01-03 DIAGNOSIS — X58XXXA Exposure to other specified factors, initial encounter: Secondary | ICD-10-CM | POA: Diagnosis not present

## 2015-01-03 HISTORY — DX: Pneumonia, unspecified organism: J18.9

## 2015-01-03 HISTORY — DX: Cardiac murmur, unspecified: R01.1

## 2015-01-03 HISTORY — DX: Nausea with vomiting, unspecified: R11.2

## 2015-01-03 HISTORY — DX: Other specified postprocedural states: Z98.890

## 2015-01-03 LAB — BASIC METABOLIC PANEL
Anion gap: 6 (ref 5–15)
BUN: 19 mg/dL (ref 6–23)
CO2: 32 mmol/L (ref 19–32)
Calcium: 9.7 mg/dL (ref 8.4–10.5)
Chloride: 102 mmol/L (ref 96–112)
Creatinine, Ser: 0.62 mg/dL (ref 0.50–1.10)
GFR calc Af Amer: >90 mL/min (ref >90)
GFR calc non Af Amer: 86 mL/min — ABNORMAL LOW (ref >90)
Glucose, Bld: 92 mg/dL (ref 70–99)
Potassium: 4 mmol/L (ref 3.5–5.1)
Sodium: 140 mmol/L (ref 135–145)

## 2015-01-03 LAB — CBC
HCT: 43 % (ref 36.0–46.0)
Hemoglobin: 14 g/dL (ref 12.0–15.0)
MCH: 29.2 pg (ref 26.0–34.0)
MCHC: 32.6 g/dL (ref 30.0–36.0)
MCV: 89.6 fL (ref 78.0–100.0)
Platelets: 210 10*3/uL (ref 150–400)
RBC: 4.8 MIL/uL (ref 3.87–5.11)
RDW: 12.3 % (ref 11.5–15.5)
WBC: 5.3 10*3/uL (ref 4.0–10.5)

## 2015-01-03 NOTE — H&P (Signed)
Barbara [name] is an 74 y.o. female.   Chief Complaint: left knee pain HPI: The patient presented with the chief complaint of left knee pain. She reports that she started having left knee pain for about a month. She notes that the first of February she was walking and felt a sharp catching pain in her left knee. Since then she has continued to have this sharp pain as well as locking and catching in her knee. She has noted swelling. She says she is unable to fully extend her knee. She does not recall any previous issues with her knee. She has no numbness or tingling in her legs. No groin pain. MRI reveals tears of the lateral and medial meniscus of the left knee.   Past Medical History  Diagnosis Date  . Allergy   . Hyperlipidemia   . Hypertension   . Colon polyps   . Osteoarthritis   . History of stress test     show normal perfusion without scar or ischemia, post EF 68%  . Hx of echocardiogram     show an EF 55%-60% range with grade 1 diastolic dysfunction, she had mitral anular calcification with mild MR, moderate LA dilation and mild pulmonary hypertension with a PA estimated pressure of 41m  . PONV (postoperative nausea and vomiting)   . Heart murmur   . CAD (coronary artery disease)     sees Dr. TShelva Majestic cardiac stents - 2000  . Pneumonia     hx of 2015     Past Surgical History  Procedure Laterality Date  . Abdominal hysterectomy  1971  . Coronary stent placement  2000    in [facility]  . Colonoscopy  04-28-09    per Dr. JTeena Irani clear, repeat in 5 yrs     Family History  Problem Relation Age of Onset  . Cancer Maternal Grandmother   . Heart disease Maternal Grandfather    Social History:  reports that she quit smoking about 18 years ago. She has never used smokeless tobacco. She reports that she does not drink alcohol or use illicit drugs.  Allergies:  Allergies  Allergen Reactions  . Morphine     Body shuts down  . Procaine Hcl     Rash. Anything with  caine in it   . Sulfonamide Derivatives Hives  . Vytorin [Ezetimibe-Simvastatin]     Pt unsure of allergy     Current outpatient prescriptions:  .  amLODipine (NORVASC) 10 MG tablet, Take 1 tablet (10 mg total) by mouth daily., Disp: 90 tablet, Rfl: 2 .  aspirin 81 MG tablet, Take 162 mg by mouth daily. Take 2 tablets every morning, Disp: , Rfl:  .  dextromethorphan (DELSYM) 30 MG/5ML liquid, Take 15 mg by mouth as needed for cough., Disp: , Rfl:  .  diphenhydramine-acetaminophen (TYLENOL PM) 25-500 MG TABS, Take 1 tablet by mouth at bedtime., Disp: , Rfl:  .  furosemide (LASIX) 20 MG tablet, Take 1 tablet (20 mg total) by mouth daily. (Patient taking differently: Take 20 mg by mouth daily as needed for fluid. ), Disp: 30 tablet, Rfl: 11 .  loratadine (CLARITIN) 10 MG tablet, Take 10 mg by mouth daily as needed for allergies. , Disp: , Rfl:  .  losartan-hydrochlorothiazide (HYZAAR) 100-25 MG per tablet, TAKE 1 TABLET DAILY, Disp: 90 tablet, Rfl: 2 .  Multiple Vitamin (MULTIVITAMIN WITH MINERALS) TABS tablet, Take 1 tablet by mouth daily., Disp: , Rfl:  .  potassium chloride (KLOR-CON  10) 10 MEQ tablet, TAKE 1 TABLET twice a day (Patient taking differently: Take 20 mEq by mouth daily. ), Disp: 180 tablet, Rfl: 3 .  Propylene Glycol-Glycerin (SOOTHE OP), Apply to eye., Disp: , Rfl:  .  rosuvastatin (CRESTOR) 10 MG tablet, Take 1 tablet (10 mg total) by mouth daily., Disp: 90 tablet, Rfl: 2 .  triamcinolone (NASACORT) 55 MCG/ACT nasal inhaler, Place 1 spray into both nostrils daily as needed (Allergies). , Disp: , Rfl:  .  ketotifen (ZADITOR) 0.025 % ophthalmic solution, 1 drop 2 (two) times daily. As needed, Disp: , Rfl:   Results for orders placed or performed during the hospital encounter of 01/03/15 (from the past 48 hour(s))  CBC     Status: None   Collection Time: 01/03/15 11:20 AM  Result Value Ref Range   WBC 5.3 4.0 - 10.5 K/uL   RBC 4.80 3.87 - 5.11 MIL/uL   Hemoglobin 14.0 12.0 -  15.0 g/dL   HCT 43.0 36.0 - 46.0 %   MCV 89.6 78.0 - 100.0 fL   MCH 29.2 26.0 - 34.0 pg   MCHC 32.6 30.0 - 36.0 g/dL   RDW 12.3 11.5 - 15.5 %   Platelets 210 150 - 400 K/uL  Basic metabolic panel     Status: Abnormal   Collection Time: 01/03/15 11:20 AM  Result Value Ref Range   Sodium 140 135 - 145 mmol/L   Potassium 4.0 3.5 - 5.1 mmol/L   Chloride 102 96 - 112 mmol/L   CO2 32 19 - 32 mmol/L   Glucose, Bld 92 70 - 99 mg/dL   BUN 19 6 - 23 mg/dL   Creatinine, Ser 0.62 0.50 - 1.10 mg/dL   Calcium 9.7 8.4 - 10.5 mg/dL   GFR calc non Af Amer 86 (L) >90 mL/min   GFR calc Af Amer >90 >90 mL/min    Comment: (NOTE) The eGFR has been calculated using the CKD EPI equation. This calculation has not been validated in all clinical situations. eGFR's persistently <90 mL/min signify possible Chronic Kidney Disease.    Anion gap 6 5 - 15    Review of Systems  Constitutional: Negative.   HENT: Negative.   Eyes: Positive for photophobia. Negative for blurred vision, double vision, pain, discharge and redness.  Respiratory: Negative.   Cardiovascular: Positive for claudication and leg swelling. Negative for chest pain, palpitations, orthopnea and PND.  Gastrointestinal: Negative.   Genitourinary: Negative.   Musculoskeletal: Positive for joint pain. Negative for myalgias, back pain, falls and neck pain.       Left knee pain  Skin: Negative.   Neurological: Negative.   Endo/Heme/Allergies: Negative.   Psychiatric/Behavioral: Negative.    Vitals Lynelle Smoke Lavonna Monarch; 12/07/2014 3:11 PM) 12/07/2014 3:08 PM Weight: 160 lb Height: 68in Body Surface Area: 1.86 m Body Mass Index: 24.33 kg/m  Pulse: 63 (Regular)  BP: 150/71 (Sitting, Left Arm, Standard)   Physical Exam  Constitutional: She is oriented to person, place, and time. She appears well-developed and well-nourished. No distress.  HENT:  Head: Normocephalic and atraumatic.  Right Ear: External ear normal.  Left Ear:  External ear normal.  Nose: Nose normal.  Mouth/Throat: Oropharynx is clear and moist.  Eyes: Conjunctivae and EOM are normal.  Neck: Normal range of motion. Neck supple.  Cardiovascular: Normal rate, regular rhythm and intact distal pulses.   Murmur heard.  Systolic murmur is present with a grade of 2/6  Respiratory: Effort normal and breath sounds normal. No respiratory  distress. She has no wheezes.  GI: Soft. Bowel sounds are normal. She exhibits no distension. There is no tenderness.  Musculoskeletal:       Right hip: Normal.       Left hip: Normal.       Right knee: Normal.       Left knee: She exhibits decreased range of motion, swelling and effusion. She exhibits no erythema. Tenderness found. Medial joint line and lateral joint line tenderness noted.  Neurological: She is alert and oriented to person, place, and time. She has normal strength and normal reflexes. No sensory deficit.  Skin: No rash noted. She is not diaphoretic. No erythema.  Psychiatric: Her behavior is normal.     Assessment/Plan Left knee, medial and lateral meniscus tears She needs a left knee arthroscopy with debridement of medial and lateral meniscus. Risks and benefits of the surgery discussed with the patient by Dr. Latanya Maudlin.   H&P performed by Dr. Latanya Maudlin Documented by Ardeen Jourdain, PA-C  [place], Jauna Raczynski Ander Purpura 01/03/2015, 4:25 PM

## 2015-01-03 NOTE — Progress Notes (Signed)
EKG- 09/13/14 EPIC  ECHO 10/06/14 EPIC  Stress 10/06/14 EPIC  LOV 12/21/14- Dr Claiborne Billings - preop clearance note. EPIC

## 2015-01-06 ENCOUNTER — Encounter (HOSPITAL_COMMUNITY)
Admission: RE | Disposition: A | Discharge status: Home / Self Care | Payer: Self-pay | Source: Ambulatory Visit | Attending: Orthopedic Surgery

## 2015-01-06 ENCOUNTER — Ambulatory Visit (HOSPITAL_COMMUNITY)
Admission: RE | Admit: 2015-01-06 | Discharge: 2015-01-06 | Disposition: A | Discharge status: Home / Self Care | Payer: Medicare Other | Source: Ambulatory Visit | Attending: Orthopedic Surgery | Admitting: Orthopedic Surgery

## 2015-01-06 ENCOUNTER — Ambulatory Visit (HOSPITAL_COMMUNITY): Payer: Medicare Other | Admitting: Anesthesiology

## 2015-01-06 ENCOUNTER — Encounter (HOSPITAL_COMMUNITY): Payer: Self-pay | Admitting: *Deleted

## 2015-01-06 DIAGNOSIS — X58XXXA Exposure to other specified factors, initial encounter: Secondary | ICD-10-CM | POA: Insufficient documentation

## 2015-01-06 DIAGNOSIS — I10 Essential (primary) hypertension: Secondary | ICD-10-CM | POA: Insufficient documentation

## 2015-01-06 DIAGNOSIS — Z87891 Personal history of nicotine dependence: Secondary | ICD-10-CM | POA: Insufficient documentation

## 2015-01-06 DIAGNOSIS — Z882 Allergy status to sulfonamides status: Secondary | ICD-10-CM | POA: Insufficient documentation

## 2015-01-06 DIAGNOSIS — M179 Osteoarthritis of knee, unspecified: Secondary | ICD-10-CM | POA: Insufficient documentation

## 2015-01-06 DIAGNOSIS — Z885 Allergy status to narcotic agent status: Secondary | ICD-10-CM | POA: Insufficient documentation

## 2015-01-06 DIAGNOSIS — E785 Hyperlipidemia, unspecified: Secondary | ICD-10-CM | POA: Insufficient documentation

## 2015-01-06 DIAGNOSIS — Y9389 Activity, other specified: Secondary | ICD-10-CM | POA: Insufficient documentation

## 2015-01-06 DIAGNOSIS — Z79899 Other long term (current) drug therapy: Secondary | ICD-10-CM | POA: Insufficient documentation

## 2015-01-06 DIAGNOSIS — S83242A Other tear of medial meniscus, current injury, left knee, initial encounter: Secondary | ICD-10-CM | POA: Diagnosis not present

## 2015-01-06 DIAGNOSIS — S83282A Other tear of lateral meniscus, current injury, left knee, initial encounter: Secondary | ICD-10-CM | POA: Diagnosis not present

## 2015-01-06 DIAGNOSIS — Z7982 Long term (current) use of aspirin: Secondary | ICD-10-CM | POA: Insufficient documentation

## 2015-01-06 DIAGNOSIS — I2510 Atherosclerotic heart disease of native coronary artery without angina pectoris: Secondary | ICD-10-CM | POA: Insufficient documentation

## 2015-01-06 DIAGNOSIS — Z888 Allergy status to other drugs, medicaments and biological substances status: Secondary | ICD-10-CM | POA: Insufficient documentation

## 2015-01-06 HISTORY — PX: KNEE ARTHROSCOPY: SHX127

## 2015-01-06 SURGERY — ARTHROSCOPY, KNEE
Anesthesia: General | Site: Knee | Laterality: Left

## 2015-01-06 MED ORDER — FENTANYL CITRATE 0.05 MG/ML IJ SOLN
INTRAMUSCULAR | Status: AC
  Filled 2015-01-06: qty 2

## 2015-01-06 MED ORDER — BACITRACIN ZINC 500 UNIT/GM EX OINT
TOPICAL_OINTMENT | CUTANEOUS | Status: AC
  Filled 2015-01-06: qty 28.35

## 2015-01-06 MED ORDER — ACETAMINOPHEN 10 MG/ML IV SOLN
1000.0000 mg | Freq: Once | INTRAVENOUS | Status: AC
  Administered 2015-01-06: 1000 mg via INTRAVENOUS
  Filled 2015-01-06: qty 100

## 2015-01-06 MED ORDER — GLYCOPYRROLATE 0.2 MG/ML IJ SOLN
INTRAMUSCULAR | Status: DC | PRN
  Administered 2015-01-06: 0.2 mg via INTRAVENOUS

## 2015-01-06 MED ORDER — ETOMIDATE 2 MG/ML IV SOLN
INTRAVENOUS | Status: DC | PRN
  Administered 2015-01-06: 10 mg via INTRAVENOUS

## 2015-01-06 MED ORDER — BACITRACIN ZINC 500 UNIT/GM EX OINT
TOPICAL_OINTMENT | CUTANEOUS | Status: DC | PRN
  Administered 2015-01-06: 1 via TOPICAL

## 2015-01-06 MED ORDER — SODIUM CHLORIDE 0.9 % IR SOLN
Status: DC | PRN
  Administered 2015-01-06: 6000 mL

## 2015-01-06 MED ORDER — LACTATED RINGERS IV SOLN
INTRAVENOUS | Status: DC
  Administered 2015-01-06: 1000 mL via INTRAVENOUS

## 2015-01-06 MED ORDER — KETOROLAC TROMETHAMINE 30 MG/ML IJ SOLN: 15.0000 mg | Freq: Once | INTRAMUSCULAR | Status: DC | PRN

## 2015-01-06 MED ORDER — FENTANYL CITRATE 0.05 MG/ML IJ SOLN
25.0000 ug | INTRAMUSCULAR | Status: DC | PRN
  Administered 2015-01-06 (×3): 50 ug via INTRAVENOUS

## 2015-01-06 MED ORDER — METOCLOPRAMIDE HCL 5 MG/ML IJ SOLN
INTRAMUSCULAR | Status: AC
  Filled 2015-01-06: qty 2

## 2015-01-06 MED ORDER — PROPOFOL 10 MG/ML IV BOLUS
INTRAVENOUS | Status: AC
  Filled 2015-01-06: qty 20

## 2015-01-06 MED ORDER — PROPOFOL 10 MG/ML IV BOLUS
INTRAVENOUS | Status: DC | PRN
  Administered 2015-01-06: 100 mg via INTRAVENOUS

## 2015-01-06 MED ORDER — FENTANYL CITRATE 0.05 MG/ML IJ SOLN
INTRAMUSCULAR | Status: DC | PRN
  Administered 2015-01-06: 25 ug via INTRAVENOUS
  Administered 2015-01-06: 50 ug via INTRAVENOUS

## 2015-01-06 MED ORDER — KETOROLAC TROMETHAMINE 15 MG/ML IJ SOLN
INTRAMUSCULAR | Status: AC
  Administered 2015-01-06: 15 mg
  Filled 2015-01-06: qty 1

## 2015-01-06 MED ORDER — CEFAZOLIN SODIUM-DEXTROSE 2-3 GM-% IV SOLR
2.0000 g | INTRAVENOUS | Status: AC
  Administered 2015-01-06: 2 g via INTRAVENOUS

## 2015-01-06 MED ORDER — OXYCODONE-ACETAMINOPHEN 5-325 MG PO TABS: 1.0000 | ORAL_TABLET | ORAL | Status: DC | PRN

## 2015-01-06 MED ORDER — OXYCODONE HCL 5 MG PO TABS: 5.0000 mg | ORAL_TABLET | ORAL | Status: DC | PRN

## 2015-01-06 MED ORDER — ONDANSETRON HCL 4 MG/2ML IJ SOLN
INTRAMUSCULAR | Status: DC | PRN
  Administered 2015-01-06: 4 mg via INTRAVENOUS

## 2015-01-06 MED ORDER — HALOPERIDOL LACTATE 5 MG/ML IJ SOLN
1.0000 mg | Freq: Once | INTRAMUSCULAR | Status: DC
  Filled 2015-01-06: qty 0.8

## 2015-01-06 MED ORDER — METOCLOPRAMIDE HCL 5 MG/ML IJ SOLN
10.0000 mg | Freq: Once | INTRAMUSCULAR | Status: AC | PRN
  Administered 2015-01-06: 10 mg via INTRAVENOUS

## 2015-01-06 MED ORDER — OXYCODONE-ACETAMINOPHEN 10-325 MG PO TABS: 1.0000 | ORAL_TABLET | ORAL | Status: DC | PRN

## 2015-01-06 MED ORDER — PROMETHAZINE HCL 25 MG/ML IJ SOLN
INTRAMUSCULAR | Status: AC
  Filled 2015-01-06: qty 1

## 2015-01-06 MED ORDER — CEFAZOLIN SODIUM-DEXTROSE 2-3 GM-% IV SOLR
INTRAVENOUS | Status: AC
  Filled 2015-01-06: qty 50

## 2015-01-06 MED ORDER — PROMETHAZINE HCL 25 MG/ML IJ SOLN
6.2500 mg | INTRAMUSCULAR | Status: DC | PRN
  Administered 2015-01-06: 6.25 mg via INTRAVENOUS

## 2015-01-06 SURGICAL SUPPLY — 32 items
BANDAGE ELASTIC 4 VELCRO ST LF (GAUZE/BANDAGES/DRESSINGS) ×3 IMPLANT
BANDAGE ELASTIC 6 VELCRO ST LF (GAUZE/BANDAGES/DRESSINGS) ×2 IMPLANT
BLADE GREAT WHITE 4.2 (BLADE) IMPLANT
BLADE GREAT WHITE 4.2MM (BLADE)
BLADE SURG SZ11 CARB STEEL (BLADE) IMPLANT
BNDG COHESIVE 6X5 TAN STRL LF (GAUZE/BANDAGES/DRESSINGS) ×3 IMPLANT
COVER SURGICAL LIGHT HANDLE (MISCELLANEOUS) ×3 IMPLANT
DRAPE SHEET LG 3/4 BI-LAMINATE (DRAPES) ×3 IMPLANT
DRSG EMULSION OIL 3X3 NADH (GAUZE/BANDAGES/DRESSINGS) ×3 IMPLANT
DRSG PAD ABDOMINAL 8X10 ST (GAUZE/BANDAGES/DRESSINGS) ×6 IMPLANT
DURAPREP 26ML APPLICATOR (WOUND CARE) ×3 IMPLANT
GAUZE SPONGE 4X4 16PLY XRAY LF (GAUZE/BANDAGES/DRESSINGS) ×3 IMPLANT
GLOVE BIOGEL PI IND STRL 8 (GLOVE) ×1 IMPLANT
GLOVE BIOGEL PI INDICATOR 8 (GLOVE) ×2
GLOVE ECLIPSE 8.0 STRL XLNG CF (GLOVE) ×3 IMPLANT
GOWN STRL REUS W/TWL LRG LVL3 (GOWN DISPOSABLE) ×3 IMPLANT
GOWN STRL REUS W/TWL XL LVL3 (GOWN DISPOSABLE) ×3 IMPLANT
KIT BASIN OR (CUSTOM PROCEDURE TRAY) ×3 IMPLANT
MANIFOLD NEPTUNE II (INSTRUMENTS) ×3 IMPLANT
MARKER PEN SURG W/LABELS BLK (STERILIZATION PRODUCTS) ×3 IMPLANT
PACK ARTHROSCOPY WL (CUSTOM PROCEDURE TRAY) ×3 IMPLANT
PACK ICE MAXI GEL EZY WRAP (MISCELLANEOUS) ×3 IMPLANT
PAD MASON LEG HOLDER (PIN) ×3 IMPLANT
SET ARTHROSCOPY TUBING (MISCELLANEOUS) ×3
SET ARTHROSCOPY TUBING LN (MISCELLANEOUS) ×1 IMPLANT
SUT ETHILON 3 0 PS 1 (SUTURE) ×3 IMPLANT
SYR 20CC LL (SYRINGE) ×3 IMPLANT
TOWEL OR 17X26 10 PK STRL BLUE (TOWEL DISPOSABLE) ×3 IMPLANT
TUBING CONNECTING 10 (TUBING) ×2 IMPLANT
TUBING CONNECTING 10' (TUBING) ×1
WAND 90 DEG TURBOVAC W/CORD (SURGICAL WAND) ×2 IMPLANT
WRAP KNEE MAXI GEL POST OP (GAUZE/BANDAGES/DRESSINGS) ×3 IMPLANT

## 2015-01-06 NOTE — Transfer of Care (Signed)
Immediate Anesthesia Transfer of Care Note  Patient: Barbara [name]  Procedure(s) Performed: Procedure(s): LEFT KNEE ARTHROSCOPY, abrasion chondroplasty of the medial femerol condryl,medial and lateral menisectomy, microfracture , synovectomy of the suprpatellar pouch (Left)  Patient Location: PACU  Anesthesia Type:General  Level of Consciousness: awake, sedated and patient cooperative  Airway & Oxygen Therapy: Patient Spontanous Breathing and Patient connected to face mask oxygen  Post-op Assessment: Report given to RN and Post -op Vital signs reviewed and stable  Post vital signs: Reviewed and stable  Last Vitals:  Filed Vitals:   01/06/15 1029  BP: 180/62  Pulse: 63  Temp: 36.9 C  Resp: 20    Complications: No apparent anesthesia complications

## 2015-01-06 NOTE — Interval H&P Note (Signed)
History and Physical Interval Note:  01/06/2015 12:37 PM  Barbara [name]  has presented today for surgery, with the diagnosis of LEFT KNEE LATERAL AND MEDIAL MENISCUS TEAR   The various methods of treatment have been discussed with the patient and family. After consideration of risks, benefits and other options for treatment, the patient has consented to  Procedure(s): LEFT KNEE ARTHROSCOPY (Left) as a surgical intervention .  The patient's history has been reviewed, patient examined, no change in status, stable for surgery.  I have reviewed the patient's chart and labs.  Questions were answered to the patient's satisfaction.     Avamae Dehaan A

## 2015-01-06 NOTE — Anesthesia Postprocedure Evaluation (Signed)
  Anesthesia Post-op Note  Patient: Barbara [name]  Procedure(s) Performed: Procedure(s) (LRB): LEFT KNEE ARTHROSCOPY, abrasion chondroplasty of the medial femerol condryl,medial and lateral menisectomy, microfracture , synovectomy of the suprpatellar pouch (Left)  Patient Location: PACU  Anesthesia Type: General  Level of Consciousness: awake and alert   Airway and Oxygen Therapy: Patient Spontanous Breathing  Post-op Pain: mild  Post-op Assessment: Post-op Vital signs reviewed, Patient's Cardiovascular Status Stable, Respiratory Function Stable, Patent Airway and No signs of Nausea or vomiting  Last Vitals:  Filed Vitals:   01/06/15 1630  BP: 98/49  Pulse: 62  Temp:   Resp: 16    Post-op Vital Signs: stable   Complications: No apparent anesthesia complications

## 2015-01-06 NOTE — Discharge Instructions (Signed)

## 2015-01-06 NOTE — Anesthesia Preprocedure Evaluation (Addendum)
Anesthesia Evaluation  Patient identified by MRN, date of birth, ID band Patient awake    Reviewed: Allergy & Precautions, NPO status , Patient's Chart, lab work & pertinent test results  History of Anesthesia Complications (+) PONV and history of anesthetic complications  Airway Mallampati: II  TM Distance: >3 FB Neck ROM: Full   Comment: TMJ Dental no notable dental hx.    Pulmonary shortness of breath, pneumonia -, former smoker,  breath sounds clear to auscultation  Pulmonary exam normal       Cardiovascular hypertension, Pt. on medications + CAD + Valvular Problems/Murmurs Rhythm:Regular Rate:Normal     Neuro/Psych negative neurological ROS  negative psych ROS   GI/Hepatic negative GI ROS, Neg liver ROS,   Endo/Other  negative endocrine ROS  Renal/GU negative Renal ROS  negative genitourinary   Musculoskeletal  (+) Arthritis -,   Abdominal   Peds negative pediatric ROS (+)  Hematology negative hematology ROS (+)   Anesthesia Other Findings   Reproductive/Obstetrics negative OB ROS                            Anesthesia Physical Anesthesia Plan  ASA: III  Anesthesia Plan: General   Post-op Pain Management:    Induction: Intravenous  Airway Management Planned: LMA  Additional Equipment:   Intra-op Plan:   Post-operative Plan: Extubation in OR  Informed Consent: I have reviewed the patients History and Physical, chart, labs and discussed the procedure including the risks, benefits and alternatives for the proposed anesthesia with the patient or authorized representative who has indicated his/her understanding and acceptance.   Dental advisory given  Plan Discussed with: CRNA  Anesthesia Plan Comments:        Anesthesia Quick Evaluation

## 2015-01-06 NOTE — Anesthesia Procedure Notes (Signed)
Procedure Name: LMA Insertion Date/Time: 01/06/2015 1:27 PM Performed by: Johnathan Hausen A Pre-anesthesia Checklist: Patient identified, Emergency Drugs available, Suction available, Patient being monitored and Timeout performed Patient Re-evaluated:Patient Re-evaluated prior to inductionOxygen Delivery Method: Circle system utilized Preoxygenation: Pre-oxygenation with 100% oxygen Intubation Type: Combination inhalational/ intravenous induction LMA: LMA with gastric port inserted LMA Size: 4.0 Number of attempts: 1 Placement Confirmation: positive ETCO2 and breath sounds checked- equal and bilateral Tube secured with: Tape Dental Injury: Teeth and Oropharynx as per pre-operative assessment

## 2015-01-06 NOTE — Brief Op Note (Signed)
01/06/2015  2:16 PM  PATIENT:  Barbara [name]  74 y.o. female  PRE-OPERATIVE DIAGNOSIS:  LEFT KNEE LATERAL AND MEDIAL MENISCUS TEAR and Primary Osteoarthritis  POST-OPERATIVE DIAGNOSIS:  LEFT KNEE LATERAL AND MEDIAL MENISCUS TEAR and Primary Osteoarthritis  PROCEDURE:  Procedure(s): LEFT KNEE ARTHROSCOPY, abrasion chondroplasty of the medial femerol condryl,medial and lateral menisectomy, microfracture , synovectomy of the suprpatellar pouch (Left),Microfracture of Medial Femoral Condyle.  SURGEON:  Surgeon(s) and Role:    * Tobi Bastos, MD - Primary    ASSISTANTS: OR Tech   ANESTHESIA:   general  EBL:     BLOOD ADMINISTERED:none  DRAINS: none   LOCAL MEDICATIONS USED:  NONE  SPECIMEN:  No Specimen  DISPOSITION OF SPECIMEN:  N/A  COUNTS:  YES  TOURNIQUET:  * No tourniquets in log *  DICTATION: .Other Dictation: Dictation Number [id number]  PLAN OF CARE: Discharge to home after PACU  PATIENT DISPOSITION:  PACU - hemodynamically stable.   Delay start of Pharmacological VTE agent (>24hrs) due to surgical blood loss or risk of bleeding: yes

## 2015-01-07 NOTE — Op Note (Signed)
[name], Barbara        ACCOUNT NO.:  [id number]  MEDICAL RECORD NO.:  [id number]  LOCATION:  [facility]                         FACILITY:  [facility]  PHYSICIAN:  Kipp Brood. Klyde Banka, M.D.DATE OF BIRTH:  06-Jul-1941  DATE OF PROCEDURE:  01/06/2015 DATE OF DISCHARGE:  01/06/2015                              OPERATIVE REPORT   SURGEON:  Jori Moll A. Oaklan Persons, M.D.  ASSISTANT:  OR tech.  PREOPERATIVE DIAGNOSES: 1. Primary osteoarthritis of the left knee. 2. Torn medial meniscus, left knee. 3. Torn lateral meniscus, left knee.  POSTOPERATIVE DIAGNOSES: 1. Primary osteoarthritis of the left knee. 2. Torn medial meniscus, left knee. 3. Torn lateral meniscus, left knee.  OPERATIONS: 1. Diagnostic arthroscopy, left knee. 2. Medial meniscectomy, left knee. 3. Lateral meniscectomy, left knee. 4. Abrasion chondroplasty of the medial femoral condyle, left knee. 5. Microfracture technique, medial femoral condyle, left knee.  DESCRIPTION OF PROCEDURE:  Under general anesthesia, routine orthopedic prepping and draping of the left lower extremity was carried out.  Left lower extremity was placed in a knee holder.  At this time, appropriate time-out was carried out.  I also marked the appropriate left leg in the holding area.  She had 2 g of IV Ancef.  Small punctate incision was made in the suprapatellar pouch.  Inflow cannula was inserted.  Knee was distended with saline.  Another small punctate incision was made in the anterolateral joint.  The arthroscope was entered from the lateral approach and a complete diagnostic arthroscopy was carried out up into the suprapatellar pouch.  I did a synovectomy utilizing the ArthroCare. I went down into the lateral joint.  She had a tear of lateral meniscus, several areas.  I did a partial lateral meniscectomy.  The cruciates were intact.  She had some early arthritic changes in the lateral compartment.  I went over to medial compartment, she had  rather significant osteoarthritic changes.  The cartilage from the medial femoral condyle was literally hanging off like orange peel.  I simply did an abrasion chondroplasty and the microfracture technique down the bleeding bone.  She has small purple-type tears of the medial meniscus, so I did a medial meniscectomy anteriorly.  I thoroughly irrigated out the knee, removed all fluid and reinspected the knee to make sure there were no loose fragments.  No Marcaine was injected into the knee because she was allergic to the "caine" drugs.  Sterile Neosporin and bundle dressing were applied after I closed all three punctate incisions.  The patient left the operating room in satisfactory condition.          ______________________________ Kipp Brood Gladstone Lighter, M.D.     RAG/MEDQ  D:  01/06/2015  T:  01/07/2015  Job:  [id number]

## 2015-01-09 ENCOUNTER — Encounter (HOSPITAL_COMMUNITY): Payer: Self-pay | Admitting: Orthopedic Surgery

## 2015-01-10 ENCOUNTER — Telehealth: Payer: Self-pay | Admitting: *Deleted

## 2015-01-10 NOTE — Telephone Encounter (Signed)
Returned surgical clearance for left knee surgery.

## 2015-01-20 ENCOUNTER — Other Ambulatory Visit: Payer: Self-pay | Admitting: Family Medicine

## 2015-01-23 NOTE — Telephone Encounter (Signed)
Can we refill this? 

## 2015-02-16 ENCOUNTER — Ambulatory Visit (INDEPENDENT_AMBULATORY_CARE_PROVIDER_SITE_OTHER): Payer: Medicare Other

## 2015-02-16 VITALS — BP 134/68 | HR 60 | Ht 68.0 in | Wt 169.5 lb

## 2015-02-16 DIAGNOSIS — I10 Essential (primary) hypertension: Secondary | ICD-10-CM

## 2015-02-16 NOTE — Patient Instructions (Signed)
Continue same medications   Keep appointment as planned

## 2015-02-16 NOTE — Progress Notes (Signed)
1.) Reason for visit:B/P check  2.) Name of MD requesting visit: Kelly  3.) H&P:Hypertension  4.) ROS related to problem: Patient was down stairs having physical therapy on her left knee and B/P elevated at that visit.Walked in office wanting B/P checked.  5.) Assessment and plan per ML:[id number] same medications.

## 2015-07-13 ENCOUNTER — Encounter: Payer: Self-pay | Admitting: Family Medicine

## 2015-07-14 ENCOUNTER — Other Ambulatory Visit: Payer: Self-pay | Admitting: Cardiovascular Disease

## 2015-07-14 NOTE — Telephone Encounter (Signed)
Rx(s) sent to pharmacy electronically.  

## 2015-07-27 ENCOUNTER — Other Ambulatory Visit (INDEPENDENT_AMBULATORY_CARE_PROVIDER_SITE_OTHER): Payer: Medicare Other

## 2015-07-27 DIAGNOSIS — Z0000 Encounter for general adult medical examination without abnormal findings: Secondary | ICD-10-CM

## 2015-07-27 DIAGNOSIS — I10 Essential (primary) hypertension: Secondary | ICD-10-CM

## 2015-07-27 LAB — BASIC METABOLIC PANEL
BUN: 14 mg/dL (ref 6–23)
CO2: 30 mEq/L (ref 19–32)
Calcium: 9.7 mg/dL (ref 8.4–10.5)
Chloride: 103 mEq/L (ref 96–112)
Creatinine, Ser: 0.71 mg/dL (ref 0.40–1.20)
GFR: 102.83 mL/min (ref >60.00)
Glucose, Bld: 94 mg/dL (ref 70–99)
Potassium: 3.8 mEq/L (ref 3.5–5.1)
Sodium: 139 mEq/L (ref 135–145)

## 2015-07-27 LAB — POCT URINALYSIS DIPSTICK
Bilirubin, UA: NEGATIVE
Blood, UA: NEGATIVE
Glucose, UA: NEGATIVE
Ketones, UA: NEGATIVE
Nitrite, UA: NEGATIVE
Protein, UA: NEGATIVE
Spec Grav, UA: 1.02
Urobilinogen, UA: 0.2
pH, UA: 6

## 2015-07-27 LAB — LIPID PANEL
Cholesterol: 140 mg/dL (ref 0–200)
HDL: 48.3 mg/dL (ref >39.00)
LDL Cholesterol: 69 mg/dL (ref 0–99)
NonHDL: 91.39
Total CHOL/HDL Ratio: 3
Triglycerides: 113 mg/dL (ref 0.0–149.0)
VLDL: 22.6 mg/dL (ref 0.0–40.0)

## 2015-07-27 LAB — CBC WITH DIFFERENTIAL/PLATELET
Basophils Absolute: 0 10*3/uL (ref 0.0–0.1)
Basophils Relative: 0.6 % (ref 0.0–3.0)
Eosinophils Absolute: 0.4 10*3/uL (ref 0.0–0.7)
Eosinophils Relative: 8.6 % — ABNORMAL HIGH (ref 0.0–5.0)
HCT: 41 % (ref 36.0–46.0)
Hemoglobin: 13.8 g/dL (ref 12.0–15.0)
Lymphocytes Relative: 22.6 % (ref 12.0–46.0)
Lymphs Abs: 0.9 10*3/uL (ref 0.7–4.0)
MCHC: 33.7 g/dL (ref 30.0–36.0)
MCV: 87.3 fl (ref 78.0–100.0)
Monocytes Absolute: 0.3 10*3/uL (ref 0.1–1.0)
Monocytes Relative: 8.3 % (ref 3.0–12.0)
Neutro Abs: 2.5 10*3/uL (ref 1.4–7.7)
Neutrophils Relative %: 59.9 % (ref 43.0–77.0)
Platelets: 184 10*3/uL (ref 150.0–400.0)
RBC: 4.69 Mil/uL (ref 3.87–5.11)
RDW: 13.1 % (ref 11.5–15.5)
WBC: 4.2 10*3/uL (ref 4.0–10.5)

## 2015-07-27 LAB — HEPATIC FUNCTION PANEL
ALT: 20 U/L (ref 0–35)
AST: 25 U/L (ref 0–37)
Albumin: 4.1 g/dL (ref 3.5–5.2)
Alkaline Phosphatase: 78 U/L (ref 39–117)
Bilirubin, Direct: 0.1 mg/dL (ref 0.0–0.3)
Total Bilirubin: 0.5 mg/dL (ref 0.2–1.2)
Total Protein: 7 g/dL (ref 6.0–8.3)

## 2015-07-27 LAB — TSH: TSH: 1.76 u[IU]/mL (ref 0.35–4.50)

## 2015-08-02 ENCOUNTER — Other Ambulatory Visit: Payer: Self-pay | Admitting: Cardiovascular Disease

## 2015-08-02 ENCOUNTER — Encounter: Payer: Self-pay | Admitting: Family Medicine

## 2015-08-02 ENCOUNTER — Ambulatory Visit (INDEPENDENT_AMBULATORY_CARE_PROVIDER_SITE_OTHER): Payer: Medicare Other | Admitting: Family Medicine

## 2015-08-02 VITALS — BP 139/70 | HR 60 | Temp 98.7°F | Ht 68.0 in | Wt 168.0 lb

## 2015-08-02 DIAGNOSIS — Z0000 Encounter for general adult medical examination without abnormal findings: Secondary | ICD-10-CM

## 2015-08-02 DIAGNOSIS — Z23 Encounter for immunization: Secondary | ICD-10-CM

## 2015-08-02 NOTE — Telephone Encounter (Signed)
REFILL 

## 2015-08-02 NOTE — Progress Notes (Signed)
Pre visit review using our clinic review tool, if applicable. No additional management support is needed unless otherwise documented below in the visit note. 

## 2015-08-03 ENCOUNTER — Encounter: Payer: Self-pay | Admitting: Family Medicine

## 2015-08-03 NOTE — Progress Notes (Signed)
   Subjective:    Patient ID: Barbara [name], female    DOB: 07/05/1941, 74 y.o.   MRN: [id number]  HPI 74 yr old female for a cpx. She feels well and has no concerns. She tries to watch her diet. Her BP has ben stable.    Review of Systems  Constitutional: Negative.   HENT: Negative.   Eyes: Negative.   Respiratory: Negative.   Cardiovascular: Negative.   Gastrointestinal: Negative.   Genitourinary: Negative for dysuria, urgency, frequency, hematuria, flank pain, decreased urine volume, enuresis, difficulty urinating, pelvic pain and dyspareunia.  Musculoskeletal: Negative.   Skin: Negative.   Neurological: Negative.   Psychiatric/Behavioral: Negative.        Objective:   Physical Exam  Constitutional: She is oriented to person, place, and time. She appears well-developed and well-nourished. No distress.  HENT:  Head: Normocephalic and atraumatic.  Right Ear: External ear normal.  Left Ear: External ear normal.  Nose: Nose normal.  Mouth/Throat: Oropharynx is clear and moist. No oropharyngeal exudate.  Eyes: Conjunctivae and EOM are normal. Pupils are equal, round, and reactive to light. No scleral icterus.  Neck: Normal range of motion. Neck supple. No JVD present. No thyromegaly present.  Cardiovascular: Normal rate, regular rhythm, normal heart sounds and intact distal pulses.  Exam reveals no gallop and no friction rub.   No murmur heard. Pulmonary/Chest: Effort normal and breath sounds normal. No respiratory distress. She has no wheezes. She has no rales. She exhibits no tenderness.  Abdominal: Soft. Bowel sounds are normal. She exhibits no distension and no mass. There is no tenderness. There is no rebound and no guarding.  Musculoskeletal: Normal range of motion. She exhibits no edema or tenderness.  Lymphadenopathy:    She has no cervical adenopathy.  Neurological: She is alert and oriented to person, place, and time. She has normal reflexes. No cranial nerve  deficit. She exhibits normal muscle tone. Coordination normal.  Skin: Skin is warm and dry. No rash noted. No erythema.  Psychiatric: She has a normal mood and affect. Her behavior is normal. Judgment and thought content normal.          Assessment & Plan:  Well exam. We discussed diet and exercise advice.

## 2015-08-22 LAB — HM MAMMOGRAPHY

## 2015-08-23 ENCOUNTER — Encounter: Payer: Self-pay | Admitting: Family Medicine

## 2015-09-04 ENCOUNTER — Ambulatory Visit (INDEPENDENT_AMBULATORY_CARE_PROVIDER_SITE_OTHER): Payer: Medicare Other | Admitting: Family Medicine

## 2015-09-04 DIAGNOSIS — Z23 Encounter for immunization: Secondary | ICD-10-CM

## 2015-09-06 ENCOUNTER — Other Ambulatory Visit: Payer: Self-pay | Admitting: Cardiovascular Disease

## 2015-12-03 ENCOUNTER — Other Ambulatory Visit: Payer: Self-pay | Admitting: Cardiovascular Disease

## 2015-12-04 NOTE — Telephone Encounter (Signed)
Rx refill sent to pharmacy. 

## 2015-12-25 ENCOUNTER — Ambulatory Visit: Payer: Medicare Other | Admitting: Cardiovascular Disease

## 2015-12-27 ENCOUNTER — Ambulatory Visit (INDEPENDENT_AMBULATORY_CARE_PROVIDER_SITE_OTHER): Payer: Medicare Other | Admitting: Cardiovascular Disease

## 2015-12-27 ENCOUNTER — Encounter: Payer: Self-pay | Admitting: Cardiovascular Disease

## 2015-12-27 VITALS — BP 160/80 | HR 48 | Ht 68.0 in | Wt 170.2 lb

## 2015-12-27 DIAGNOSIS — E785 Hyperlipidemia, unspecified: Secondary | ICD-10-CM

## 2015-12-27 DIAGNOSIS — R0609 Other forms of dyspnea: Secondary | ICD-10-CM

## 2015-12-27 DIAGNOSIS — I10 Essential (primary) hypertension: Secondary | ICD-10-CM | POA: Diagnosis not present

## 2015-12-27 DIAGNOSIS — R001 Bradycardia, unspecified: Secondary | ICD-10-CM

## 2015-12-27 DIAGNOSIS — I2510 Atherosclerotic heart disease of native coronary artery without angina pectoris: Secondary | ICD-10-CM

## 2015-12-27 DIAGNOSIS — R0600 Dyspnea, unspecified: Secondary | ICD-10-CM

## 2015-12-27 NOTE — Progress Notes (Signed)
Patient ID: Barbara [name], female   DOB: [birth year], 75 y.o.   MRN: [id number]    Primary MD: Dr. Alysia Penna  HPI: Barbara [name] is a 75 y.o. female who presents for a one year follow-up cardiology evaluation.  Barbara [name] has CAD and in November 2000 underwent stenting of a high-grade LAD stenosis with an S670 3.518 mm bare-metal stent. A nuclear perfusion study in November 2013 was unchanged from previously and continued to show normal perfusion without scar or ischemia. Ejection fraction was 68%. An echo Doppler study revealed an ejection fraction in the 55-60% range with grade 1 diastolic dysfunction. 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  Additional problems include hypertension as well as hyperlipidemia. Over the past several months she has noticed that she is more tired.  She also has noticed more shortness of breath with activity.  She has noted some vague indigestion symptoms.  She admits to some mild ankle swelling.  She has been taking Crestor 10 mg and denies myalgias.  She has been on losartan HCT 100/25 as well as amlodipine 10 mg and Lasix 20 mg for blood pressure and peripheral edema.  When I saw her in November.  Her blood pressure was controlled.  She was bradycardic not on any rate control medication and I raise the possibility of a component of chronotropic incompetence.  To further evaluate her exertional dyspnea.  She underwent a nuclear perfusion study on 10/06/2014 which was normal.  Post stress ejection fraction was 67%.  An echo Doppler study done on 10/06/2014 showed an EF of 60-65% with moderate left ventricular hypertrophy.  There was grade 1 diastolic dysfunction.  She had indeterminate LV filling pressure.  There was mild aortic sclerosis without stenosis, mitral annular calcification with mild MR, and mild dilatation of her left atrium with mild  tricuspid regurgitation.  Pulmonary pressures were minimally elevated at 31 mm.  I last saw her one year ago, she underwent successful left knee surgery by Dr. Lindwood Qua in March 2016.  She tolerated surgery well from a cardiovascular standpoint.  Presently, Barbara [name] denies any episodes of chest pressure or shortness of breath.  She does have issues with fatigability and being tired.  She feels that her sleep is well.  She denies presyncope or syncope.  She is unaware of any palpitations.  She presents for follow-up evaluation.  Past Medical History  Diagnosis Date  . Allergy   . Hyperlipidemia   . Hypertension   . Colon polyps   . Osteoarthritis   . History of stress test     show normal perfusion without scar or ischemia, post EF 68%  . Hx of echocardiogram     show an EF 55%-60% range with grade 1 diastolic dysfunction, she had mitral anular calcification with mild MR, moderate LA dilation and mild pulmonary hypertension with a PA estimated pressure of 38m  . PONV (postoperative nausea and vomiting)   . Heart murmur   . CAD (coronary artery disease)     sees Dr. TShelva Majestic cardiac stents - 2000  . Pneumonia     hx of 2015     Past Surgical History  Procedure Laterality Date  . Abdominal hysterectomy  1971  . Coronary stent placement  2000    in LAD  . Colonoscopy  01-05-14    per Dr. JTeena Irani clear, no repeats needed   .  Knee arthroscopy Left 01/06/2015    Procedure: LEFT KNEE ARTHROSCOPY, abrasion chondroplasty of the medial femerol condryl,medial and lateral menisectomy, microfracture , synovectomy of the suprpatellar pouch;  Surgeon: Latanya Maudlin, MD;  Location: [facility];  Service: Orthopedics;  Laterality: Left;    Allergies  Allergen Reactions  . Morphine     Body shuts down  . Procaine Hcl     Rash. Anything with caine in it   . Sulfonamide Derivatives Hives  . Vytorin [Ezetimibe-Simvastatin]     Pt unsure of allergy     Current Outpatient  Prescriptions  Medication Sig Dispense Refill  . amLODipine (NORVASC) 10 MG tablet TAKE 1 TABLET DAILY 30 tablet 0  . dextromethorphan (DELSYM) 30 MG/5ML liquid Take 15 mg by mouth as needed for cough.    . diclofenac (VOLTAREN) 75 MG EC tablet TAKE ONE TABLET BY MOUTH TWICE DAILY AS NEEDED FOR PAIN 60 tablet 6  . diphenhydramine-acetaminophen (TYLENOL PM) 25-500 MG TABS Take 1 tablet by mouth at bedtime.    . furosemide (LASIX) 20 MG tablet Take 1 tablet (20 mg total) by mouth daily. (Patient taking differently: Take 20 mg by mouth daily as needed for fluid. ) 30 tablet 11  . ketotifen (ZADITOR) 0.025 % ophthalmic solution 1 drop 2 (two) times daily. As needed    . loratadine (CLARITIN) 10 MG tablet Take 10 mg by mouth daily as needed for allergies.     Marland Kitchen losartan-hydrochlorothiazide (HYZAAR) 100-25 MG per tablet Take 1 tablet by mouth daily. 90 tablet 1  . Multiple Vitamin (MULTIVITAMIN WITH MINERALS) TABS tablet Take 1 tablet by mouth daily.    Marland Kitchen oxyCODONE-acetaminophen (PERCOCET) 10-325 MG per tablet Take 1 tablet by mouth every 4 (four) hours as needed for pain. 40 tablet 0  . potassium chloride (K-DUR) 10 MEQ tablet TAKE 1 TABLET TWICE A DAY 180 tablet 3  . Propylene Glycol-Glycerin (SOOTHE OP) Apply to eye.    . rosuvastatin (CRESTOR) 10 MG tablet TAKE 1 TABLET DAILY 90 tablet 1  . triamcinolone (NASACORT) 55 MCG/ACT nasal inhaler Place 1 spray into both nostrils daily as needed (Allergies).      No current facility-administered medications for this visit.    Social History   Social History  . Marital Status: Married    Spouse Name: N/A  . Number of Children: N/A  . Years of Education: N/A   Occupational History  . Not on file.   Social History Main Topics  . Smoking status: Former Smoker    Quit date: 11/04/1996  . Smokeless tobacco: Never Used  . Alcohol Use: No  . Drug Use: No  . Sexual Activity: Not on file   Other Topics Concern  . Not on file   Social History  Narrative    Family History  Problem Relation Age of Onset  . Cancer Maternal Grandmother   . Heart disease Maternal Grandfather     ROS General: Negative; No fevers, chills, or night sweats;  HEENT: Negative; No changes in vision or hearing, sinus congestion, difficulty swallowing Pulmonary: Negative; No cough, wheezing, hemoptysis Cardiovascular: the history of present illness.  Mild exertional shortness of breath GI: Negative; No nausea, vomiting, diarrhea, or abdominal pain GU: Negative; No dysuria, hematuria, or difficulty voiding Musculoskeletal: history of plantar fasciitis; status post left knee surgery March 2016 no myalgias, joint pain, or weakness Hematologic/Oncology: Negative; no easy bruising, bleeding Endocrine: Negative; no heat/cold intolerance; no diabetes Neuro: Negative; no changes in balance, headaches Skin: Negative; No  rashes or skin lesions Psychiatric: Negative; No behavioral problems, depression Sleep: Negative; No snoring, daytime sleepiness, hypersomnolence, bruxism, restless legs, hypnogognic hallucinations, no cataplexy Other comprehensive 14 point system review is negative.   PE BP 160/80 mmHg  Pulse 48  Ht _0  (1.727 m)  Wt 170 lb 3 oz (77.197 kg)  BMI 25.88 kg/m2   Repeat blood pressure by me 142/82.  The patient did not take her medications this morning,  Wt Readings from Last 3 Encounters:  12/27/15 170 lb 3 oz (77.197 kg)  08/02/15 168 lb (76.204 kg)  02/16/15 169 lb 8 oz (76.885 kg)   General: Alert, oriented, no distress.  Skin: normal turgor, no rashes HEENT: Normocephalic, atraumatic. Pupils round and reactive; sclera anicteric;no lid lag.  Nose without nasal septal hypertrophy Mouth/Parynx benign; Mallinpatti scale 2  Neck: No JVD, no carotid bruits with normal carotid upstroke Lungs: clear to ausculatation and percussion; no wheezing or rales Chest wall: Nontender to palpation Heart: RRR, s1 s2 normal;  2/6 systolic murmur in  the aortic area left sternal border; No diastolic murmur.  No S3 gallop; .  No rubs thrills or heaves Abdomen: soft, nontender; no hepatosplenomehaly, BS+; abdominal aorta nontender and not dilated by palpation. Back: No CVA tenderness Pulses 2+ Extremities: Reveal bilateral ankle edema, no clubbing or cyanosis, negative Homan's sign  Neurologic: grossly nonfocal Psychologic: normal affect and mood.  ECG (independently read by me): Probable low atrial rhythm with a ventricular rate at 49 bpm.  Previous seen noted diffuse T-wave abnormality in leads II, III, and F, V3 through V6, and lead 1.  February 2016 ECG (independently read by me): Marked sinus bradycardia at 45 bpm with LVH and diffuse T-wave inversion II, III, and F lead 1, and V3 through V6; minimally changed from one year previously.   November 2014 ECG: Sinus bradycardia with sinus arrhythmia. Previously noted LVH with marked repolarization T-wave abnormalities which have been present previously.  LABS: BMP Latest Ref Rng 07/27/2015 01/03/2015 10/04/2014  Glucose 70 - 99 mg/dL 94 92 90  BUN 6 - 23 mg/dL _1 Creatinine 0.40 - 1.20 mg/dL 0.71 0.62 0.74  Sodium 135 - 145 mEq/L 139 140 139  Potassium 3.5 - 5.1 mEq/L 3.8 4.0 3.9  Chloride 96 - 112 mEq/L 103 102 102  CO2 19 - 32 mEq/L 30 32 29  Calcium 8.4 - 10.5 mg/dL 9.7 9.7 9.7   Hepatic Function Latest Ref Rng 07/27/2015 10/04/2014 12/14/2013  Total Protein 6.0 - 8.3 g/dL 7.0 6.8 7.2  Albumin 3.5 - 5.2 g/dL 4.1 4.1 4.0  AST 0 - 37 U/L _2 ALT 0 - 35 U/L _3 Alk Phosphatase 39 - 117 U/L 78 72 61  Total Bilirubin 0.2 - 1.2 mg/dL 0.5 0.5 0.7  Bilirubin, Direct 0.0 - 0.3 mg/dL 0.1 - 0.1   CBC Latest Ref Rng 07/27/2015 01/03/2015 10/04/2014  WBC 4.0 - 10.5 K/uL 4.2 5.3 4.4  Hemoglobin 12.0 - 15.0 g/dL 13.8 14.0 13.8  Hematocrit 36.0 - 46.0 % 41.0 43.0 40.6  Platelets 150.0 - 400.0 K/uL 184.0 210 208   Lab Results  Component Value Date   MCV 87.3 07/27/2015   MCV  89.6 01/03/2015   MCV 85.7 10/04/2014   Lab Results  Component Value Date   TSH 1.76 07/27/2015   No results found for: HGBA1C  Lipid Panel     Component Value Date/Time   CHOL 140 07/27/2015 0837   TRIG  113.0 07/27/2015 0837   HDL 48.30 07/27/2015 0837   CHOLHDL 3 07/27/2015 0837   VLDL 22.6 07/27/2015 0837   LDLCALC 69 07/27/2015 0837    RADIOLOGY: No results found.    ASSESSMENT AND PLAN: Barbara [name] is a 75 year old Afro-American female who is 17 years status post stenting of a high-grade proximal LAD stenosis with insertion of an S670 3.5x18 mm bare-metal stent. Her blood pressure today is widely elevated today at initially 160/80 and repeat 142/82, but she has not taken her blood pressure medicine this morning since she had a prior early appointment.  She has been on aregimen consisting of amlodipine 10 mg, furosemide 20 mg, and losartan HCT 100/25 mg..  She is not on any rate slowing medications, but does have bradycardia suggesting possible chronotropic incompetence, which may be playing a role in her cheek ability as well as some very mild exertional shortness of breath.  Her ECG today suggests a low atrial rhythm with a ventricular rate at 49.  Her ST-T changes are chronic.  Her last nuclear perfusion study continued to show normal perfusion and argues against CAD progression, scar/ischemia.  Her echo Doppler study of December 2015 confirms normal systolic function with grade 1 diastolic dysfunction which may be contributory to her exertional dyspnea.  She tolerated her knee surgery without cardiovascular compromise.  Next month, I am recommending six-month follow-up laboratory on her current medical regimen.  Although she is bradycardic, she does not have any symptoms of dizziness or presyncope.  I will obtain a 2 year follow-up echo Doppler study in December 2017 to reassess her systolic and diastolic function as well as aortic valve disease and will see her back in the office  for follow-up evaluation.  Time spent: 25 minutes Troy Sine, MD, [facility]  12/27/2015 9:38 AM

## 2015-12-27 NOTE — Patient Instructions (Addendum)
Your physician recommends that you return for lab work fasting in 1 month.  Your physician has requested that you have an echocardiogram. Echocardiography is a painless test that uses sound waves to create images of your heart. It provides your doctor with information about the size and shape of your heart and how well your heart's chambers and valves are working. This procedure takes approximately one hour. There are no restrictions for this procedure. This will be done in December.  Your physician recommends that you schedule a follow-up appointment in: December with Dr Claiborne Billings.

## 2015-12-29 ENCOUNTER — Ambulatory Visit: Payer: Medicare Other | Admitting: Cardiovascular Disease

## 2016-01-02 NOTE — Addendum Note (Signed)
Addended by: Fidel Levy on: 01/02/2016 02:17 PM   Modules accepted: Orders

## 2016-01-08 ENCOUNTER — Other Ambulatory Visit: Payer: Self-pay | Admitting: Cardiovascular Disease

## 2016-01-08 NOTE — Telephone Encounter (Signed)
Rx(s) sent to pharmacy electronically.  

## 2016-01-12 ENCOUNTER — Other Ambulatory Visit: Payer: Self-pay

## 2016-01-12 ENCOUNTER — Ambulatory Visit (HOSPITAL_COMMUNITY): Payer: Medicare Other | Attending: Cardiology

## 2016-01-12 DIAGNOSIS — I2510 Atherosclerotic heart disease of native coronary artery without angina pectoris: Secondary | ICD-10-CM | POA: Diagnosis not present

## 2016-01-12 DIAGNOSIS — I10 Essential (primary) hypertension: Secondary | ICD-10-CM | POA: Diagnosis not present

## 2016-01-12 DIAGNOSIS — I700 Atherosclerosis of aorta: Secondary | ICD-10-CM | POA: Insufficient documentation

## 2016-01-12 DIAGNOSIS — I119 Hypertensive heart disease without heart failure: Secondary | ICD-10-CM | POA: Diagnosis not present

## 2016-01-12 DIAGNOSIS — E785 Hyperlipidemia, unspecified: Secondary | ICD-10-CM | POA: Insufficient documentation

## 2016-01-19 ENCOUNTER — Encounter: Payer: Self-pay | Admitting: *Deleted

## 2016-01-24 LAB — CBC
HCT: 42.5 % (ref 36.0–46.0)
Hemoglobin: 14.6 g/dL (ref 12.0–15.0)
MCH: 29.6 pg (ref 26.0–34.0)
MCHC: 34.4 g/dL (ref 30.0–36.0)
MCV: 86.2 fL (ref 78.0–100.0)
MPV: 10.2 fL (ref 8.6–12.4)
Platelets: 210 10*3/uL (ref 150–400)
RBC: 4.93 MIL/uL (ref 3.87–5.11)
RDW: 13.4 % (ref 11.5–15.5)
WBC: 4.7 10*3/uL (ref 4.0–10.5)

## 2016-01-24 LAB — COMPREHENSIVE METABOLIC PANEL
ALT: 18 U/L (ref 6–29)
AST: 22 U/L (ref 10–35)
Albumin: 4 g/dL (ref 3.6–5.1)
Alkaline Phosphatase: 70 U/L (ref 33–130)
BUN: 18 mg/dL (ref 7–25)
CO2: 28 mmol/L (ref 20–31)
Calcium: 9.6 mg/dL (ref 8.6–10.4)
Chloride: 103 mmol/L (ref 98–110)
Creat: 0.8 mg/dL (ref 0.60–0.93)
Glucose, Bld: 85 mg/dL (ref 65–99)
Potassium: 4.1 mmol/L (ref 3.5–5.3)
Sodium: 140 mmol/L (ref 135–146)
Total Bilirubin: 0.5 mg/dL (ref 0.2–1.2)
Total Protein: 6.6 g/dL (ref 6.1–8.1)

## 2016-01-24 LAB — LIPID PANEL
Cholesterol: 135 mg/dL (ref 125–200)
HDL: 57 mg/dL (ref >=46)
LDL Cholesterol: 60 mg/dL (ref <130)
Total CHOL/HDL Ratio: 2.4 Ratio (ref <=5.0)
Triglycerides: 91 mg/dL (ref <150)
VLDL: 18 mg/dL (ref <30)

## 2016-01-24 LAB — TSH: TSH: 2.01 mIU/L

## 2016-01-30 ENCOUNTER — Other Ambulatory Visit: Payer: Self-pay | Admitting: Cardiovascular Disease

## 2016-01-30 NOTE — Telephone Encounter (Signed)
REFILL 

## 2016-02-05 ENCOUNTER — Telehealth: Payer: Self-pay | Admitting: *Deleted

## 2016-02-05 NOTE — Telephone Encounter (Signed)
-----   Message from Troy Sine, MD sent at 02/03/2016  3:11 PM EDT ----- Labs good

## 2016-02-05 NOTE — Telephone Encounter (Signed)
Called and gave patient lab results

## 2016-02-08 ENCOUNTER — Other Ambulatory Visit: Payer: Self-pay | Admitting: Family Medicine

## 2016-03-11 ENCOUNTER — Telehealth: Payer: Self-pay | Admitting: Cardiovascular Disease

## 2016-03-11 MED ORDER — AMLODIPINE BESYLATE 10 MG PO TABS: 10.0000 mg | ORAL_TABLET | Freq: Every day | ORAL | Status: DC

## 2016-03-11 NOTE — Telephone Encounter (Signed)
New message       *STAT* If patient is at the pharmacy, call can be transferred to refill team.   1. Which medications need to be refilled? (please list name of each medication and dose if known) amlodipine 10mg   2. Which pharmacy/location (including street and city if local pharmacy) is medication [facility] sent to[facility] 3. Do they need a 30 day or 90 day supply? 90 day

## 2016-03-11 NOTE — Telephone Encounter (Signed)
rx refilled.

## 2016-03-14 ENCOUNTER — Telehealth: Payer: Self-pay | Admitting: *Deleted

## 2016-03-14 NOTE — Telephone Encounter (Signed)
Received request for change in prescription for Potassium Chloride 81meq to Potassium Chloride ER 81meq.  Request from [facility] "Please authorize a new prescription for 90 days with refills.  The patient was dispensed a product that dissolves too quickly and wanted the coated tabs."     Okay to change and send rx as requested?

## 2016-03-15 NOTE — Telephone Encounter (Signed)
Yes, please change to the ER variety

## 2016-03-18 MED ORDER — POTASSIUM CHLORIDE ER 10 MEQ PO CPCR: 10.0000 meq | ORAL_CAPSULE | Freq: Every day | ORAL | Status: DC

## 2016-03-18 NOTE — Telephone Encounter (Signed)
I spoke with pt and sent script e-scribe, per Dr. Sarajane Jews okay to order the capsule and take once per day.

## 2016-04-02 ENCOUNTER — Telehealth: Payer: Self-pay | Admitting: *Deleted

## 2016-04-02 NOTE — Telephone Encounter (Signed)
This should actually be for BID, so please send in for #180 with 3 rf

## 2016-04-02 NOTE — Telephone Encounter (Signed)
[facility] faxed a request for a refill on Potassium Chlor ER 32meq-#180 to take 2 tablets daily; however the medication list notes the pt to be taking 1 daily?  Please verify correct Rx.

## 2016-04-03 MED ORDER — POTASSIUM CHLORIDE ER 10 MEQ PO CPCR: 10.0000 meq | ORAL_CAPSULE | Freq: Two times a day (BID) | ORAL | Status: DC

## 2016-04-03 NOTE — Telephone Encounter (Signed)
I resent script e-scribe with correct directions.

## 2016-04-25 ENCOUNTER — Encounter: Payer: Self-pay | Admitting: Family Medicine

## 2016-04-25 ENCOUNTER — Ambulatory Visit (INDEPENDENT_AMBULATORY_CARE_PROVIDER_SITE_OTHER): Payer: Medicare Other | Admitting: Family Medicine

## 2016-04-25 VITALS — BP 140/60 | Temp 98.5°F | Ht 68.0 in | Wt 168.0 lb

## 2016-04-25 DIAGNOSIS — R1013 Epigastric pain: Secondary | ICD-10-CM | POA: Diagnosis not present

## 2016-04-25 NOTE — Progress Notes (Signed)
   Subjective:    Patient ID: Barbara [name], female    DOB: April 06, 1941, 75 y.o.   MRN: [id number]  HPI Here for one week of epigastric pain and bloating. No nausea or vomiting. No fevers. She has tried [place] with no relief. Her BMs are normal.    Review of Systems  Constitutional: Negative.   Respiratory: Negative.   Cardiovascular: Negative.   Gastrointestinal: Positive for abdominal pain and abdominal distention. Negative for nausea, vomiting, diarrhea, constipation, blood in stool, anal bleeding and rectal pain.       Objective:   Physical Exam  Constitutional: She appears well-developed and well-nourished.  Neck: No thyromegaly present.  Cardiovascular: Normal rate, regular rhythm, normal heart sounds and intact distal pulses.   Pulmonary/Chest: Effort normal and breath sounds normal. No respiratory distress. She has no wheezes. She has no rales.  Abdominal: Soft. Bowel sounds are normal. She exhibits no distension and no mass. There is no rebound and no guarding.  Mildly tender over th epigastrium   Lymphadenopathy:    She has no cervical adenopathy.          Assessment & Plan:  Epigastric pain, probable GERD. Try Prilosec OTC prn. Laurey Morale, MD

## 2016-04-25 NOTE — Progress Notes (Signed)
Pre visit review using our clinic review tool, if applicable. No additional management support is needed unless otherwise documented below in the visit note. 

## 2016-04-30 ENCOUNTER — Other Ambulatory Visit: Payer: Self-pay | Admitting: Cardiovascular Disease

## 2016-05-10 ENCOUNTER — Other Ambulatory Visit: Payer: Self-pay | Admitting: Family Medicine

## 2016-05-10 MED ORDER — FUROSEMIDE 20 MG PO TABS: 20.0000 mg | ORAL_TABLET | Freq: Every day | ORAL | Status: DC

## 2016-05-10 NOTE — Telephone Encounter (Signed)
Rx done. 

## 2016-05-10 NOTE — Telephone Encounter (Signed)
Patient needs her Rx furosemide (LASIX) 20 MG tablet sent [facility] [id number] - [place],  - [place]

## 2016-05-22 ENCOUNTER — Encounter: Payer: Self-pay | Admitting: Family Medicine

## 2016-05-22 ENCOUNTER — Ambulatory Visit (INDEPENDENT_AMBULATORY_CARE_PROVIDER_SITE_OTHER): Payer: Medicare Other | Admitting: Family Medicine

## 2016-05-22 VITALS — BP 155/79 | HR 60 | Temp 98.3°F | Ht 68.0 in | Wt 170.0 lb

## 2016-05-22 DIAGNOSIS — T148 Other injury of unspecified body region: Secondary | ICD-10-CM

## 2016-05-22 DIAGNOSIS — W57XXXA Bitten or stung by nonvenomous insect and other nonvenomous arthropods, initial encounter: Secondary | ICD-10-CM

## 2016-05-22 MED ORDER — DESOXIMETASONE 0.25 % EX CREA: 1.0000 "application " | TOPICAL_CREAM | Freq: Two times a day (BID) | CUTANEOUS | Status: DC

## 2016-05-22 NOTE — Progress Notes (Signed)
Pre visit review using our clinic review tool, if applicable. No additional management support is needed unless otherwise documented below in the visit note. 

## 2016-05-22 NOTE — Progress Notes (Signed)
   Subjective:    Patient ID: Barbara [name], female    DOB: 1941/10/11, 75 y.o.   MRN: [id number]  HPI Here for several insect bites this week that itch. She feels fine in general. She used to use Topicort cream for this but she ran out.    Review of Systems  Constitutional: Negative.   Skin: Positive for wound.       Objective:   Physical Exam  Constitutional: She is oriented to person, place, and time. She appears well-developed and well-nourished.  Cardiovascular: Normal rate, regular rhythm, normal heart sounds and intact distal pulses.   Pulmonary/Chest: Effort normal and breath sounds normal.  Neurological: She is alert and oriented to person, place, and time.  Skin:  She has a small red papular lesion on the left hand and a larger lesion on the left inner thigh          Assessment & Plan:  Insect bites, use Topicort cream and ice prn.  Laurey Morale, MD

## 2016-08-21 ENCOUNTER — Other Ambulatory Visit (INDEPENDENT_AMBULATORY_CARE_PROVIDER_SITE_OTHER): Payer: Medicare Other

## 2016-08-21 DIAGNOSIS — Z0000 Encounter for general adult medical examination without abnormal findings: Secondary | ICD-10-CM

## 2016-08-21 LAB — CBC WITH DIFFERENTIAL/PLATELET
Basophils Absolute: 0 10*3/uL (ref 0.0–0.1)
Basophils Relative: 0.2 % (ref 0.0–3.0)
Eosinophils Absolute: 0.3 10*3/uL (ref 0.0–0.7)
Eosinophils Relative: 5.9 % — ABNORMAL HIGH (ref 0.0–5.0)
HCT: 41.9 % (ref 36.0–46.0)
Hemoglobin: 14.1 g/dL (ref 12.0–15.0)
Lymphocytes Relative: 26.2 % (ref 12.0–46.0)
Lymphs Abs: 1.1 10*3/uL (ref 0.7–4.0)
MCHC: 33.7 g/dL (ref 30.0–36.0)
MCV: 85.7 fl (ref 78.0–100.0)
Monocytes Absolute: 0.3 10*3/uL (ref 0.1–1.0)
Monocytes Relative: 7.9 % (ref 3.0–12.0)
Neutro Abs: 2.6 10*3/uL (ref 1.4–7.7)
Neutrophils Relative %: 59.8 % (ref 43.0–77.0)
Platelets: 208 10*3/uL (ref 150.0–400.0)
RBC: 4.89 Mil/uL (ref 3.87–5.11)
RDW: 13.3 % (ref 11.5–15.5)
WBC: 4.4 10*3/uL (ref 4.0–10.5)

## 2016-08-21 LAB — LIPID PANEL
Cholesterol: 137 mg/dL (ref 0–200)
HDL: 52.3 mg/dL (ref >39.00)
LDL Cholesterol: 62 mg/dL (ref 0–99)
NonHDL: 84.92
Total CHOL/HDL Ratio: 3
Triglycerides: 116 mg/dL (ref 0.0–149.0)
VLDL: 23.2 mg/dL (ref 0.0–40.0)

## 2016-08-21 LAB — BASIC METABOLIC PANEL
BUN: 16 mg/dL (ref 6–23)
CO2: 32 mEq/L (ref 19–32)
Calcium: 10 mg/dL (ref 8.4–10.5)
Chloride: 102 mEq/L (ref 96–112)
Creatinine, Ser: 0.72 mg/dL (ref 0.40–1.20)
GFR: 100.9 mL/min (ref >60.00)
Glucose, Bld: 84 mg/dL (ref 70–99)
Potassium: 3.7 mEq/L (ref 3.5–5.1)
Sodium: 140 mEq/L (ref 135–145)

## 2016-08-21 LAB — HEPATIC FUNCTION PANEL
ALT: 17 U/L (ref 0–35)
AST: 22 U/L (ref 0–37)
Albumin: 4.2 g/dL (ref 3.5–5.2)
Alkaline Phosphatase: 71 U/L (ref 39–117)
Bilirubin, Direct: 0.1 mg/dL (ref 0.0–0.3)
Total Bilirubin: 0.5 mg/dL (ref 0.2–1.2)
Total Protein: 7.4 g/dL (ref 6.0–8.3)

## 2016-08-21 LAB — TSH: TSH: 1.64 u[IU]/mL (ref 0.35–4.50)

## 2016-08-21 LAB — POC URINALSYSI DIPSTICK (AUTOMATED)
Bilirubin, UA: NEGATIVE
Blood, UA: NEGATIVE
Glucose, UA: NEGATIVE
Ketones, UA: NEGATIVE
Nitrite, UA: NEGATIVE
Protein, UA: NEGATIVE
Spec Grav, UA: 1.02
Urobilinogen, UA: 0.2
pH, UA: 5.5

## 2016-08-22 ENCOUNTER — Other Ambulatory Visit: Payer: Medicare Other

## 2016-08-22 ENCOUNTER — Encounter: Payer: Self-pay | Admitting: Family Medicine

## 2016-08-29 ENCOUNTER — Encounter: Payer: Medicare Other | Admitting: Family Medicine

## 2016-09-02 ENCOUNTER — Other Ambulatory Visit: Payer: Self-pay | Admitting: Cardiovascular Disease

## 2016-09-04 ENCOUNTER — Encounter: Payer: Self-pay | Admitting: Family Medicine

## 2016-09-04 ENCOUNTER — Ambulatory Visit (INDEPENDENT_AMBULATORY_CARE_PROVIDER_SITE_OTHER): Payer: Medicare Other | Admitting: Family Medicine

## 2016-09-04 VITALS — BP 148/75 | HR 58 | Temp 98.4°F | Ht 68.0 in | Wt 170.0 lb

## 2016-09-04 DIAGNOSIS — Z0000 Encounter for general adult medical examination without abnormal findings: Secondary | ICD-10-CM | POA: Diagnosis not present

## 2016-09-04 DIAGNOSIS — Z23 Encounter for immunization: Secondary | ICD-10-CM

## 2016-09-04 MED ORDER — POTASSIUM CHLORIDE ER 10 MEQ PO CPCR: 10.0000 meq | ORAL_CAPSULE | Freq: Two times a day (BID) | ORAL | 3 refills | Status: DC

## 2016-09-04 NOTE — Progress Notes (Signed)
   Subjective:    Patient ID: Barbara [name], female    DOB: 28-Feb-1941, 75 y.o.   MRN: [id number]  HPI 75 yr old female for a well exam. She feels well except for left knee pain. She is seeing [facility] and she recently got the first of 3 injections.    Review of Systems  Constitutional: Negative.   HENT: Negative.   Eyes: Negative.   Respiratory: Negative.   Cardiovascular: Negative.   Gastrointestinal: Negative.   Genitourinary: Negative for decreased urine volume, difficulty urinating, dyspareunia, dysuria, enuresis, flank pain, frequency, hematuria, pelvic pain and urgency.  Musculoskeletal: Positive for arthralgias. Negative for back pain, gait problem, joint swelling, myalgias, neck pain and neck stiffness.  Skin: Negative.   Neurological: Negative.   Psychiatric/Behavioral: Negative.        Objective:   Physical Exam  Constitutional: She is oriented to person, place, and time. She appears well-developed and well-nourished. No distress.  HENT:  Head: Normocephalic and atraumatic.  Right Ear: External ear normal.  Left Ear: External ear normal.  Nose: Nose normal.  Mouth/Throat: Oropharynx is clear and moist. No oropharyngeal exudate.  Eyes: Conjunctivae and EOM are normal. Pupils are equal, round, and reactive to light. No scleral icterus.  Neck: Normal range of motion. Neck supple. No JVD present. No thyromegaly present.  Cardiovascular: Normal rate, regular rhythm, normal heart sounds and intact distal pulses.  Exam reveals no gallop and no friction rub.   No murmur heard. Pulmonary/Chest: Effort normal and breath sounds normal. No respiratory distress. She has no wheezes. She has no rales. She exhibits no tenderness.  Abdominal: Soft. Bowel sounds are normal. She exhibits no distension and no mass. There is no tenderness. There is no rebound and no guarding.  Musculoskeletal: Normal range of motion. She exhibits no edema or tenderness.  Lymphadenopathy:   She has no cervical adenopathy.  Neurological: She is alert and oriented to person, place, and time. She has normal reflexes. No cranial nerve deficit. She exhibits normal muscle tone. Coordination normal.  Skin: Skin is warm and dry. No rash noted. No erythema.  Psychiatric: She has a normal mood and affect. Her behavior is normal. Judgment and thought content normal.          Assessment & Plan:  Well exam. We discussed diet and exercise.  Laurey Morale, MD

## 2016-09-04 NOTE — Progress Notes (Signed)
Pre visit review using our clinic review tool, if applicable. No additional management support is needed unless otherwise documented below in the visit note. 

## 2016-12-02 ENCOUNTER — Other Ambulatory Visit: Payer: Self-pay | Admitting: Cardiovascular Disease

## 2017-01-25 ENCOUNTER — Other Ambulatory Visit: Payer: Self-pay | Admitting: Cardiovascular Disease

## 2017-01-27 NOTE — Telephone Encounter (Signed)
Rx(s) sent to pharmacy electronically.  

## 2017-02-05 ENCOUNTER — Other Ambulatory Visit: Payer: Self-pay | Admitting: Cardiovascular Disease

## 2017-03-01 ENCOUNTER — Other Ambulatory Visit: Payer: Self-pay | Admitting: Cardiovascular Disease

## 2017-03-03 ENCOUNTER — Other Ambulatory Visit: Payer: Self-pay | Admitting: Cardiovascular Disease

## 2017-03-03 ENCOUNTER — Other Ambulatory Visit: Payer: Self-pay | Admitting: *Deleted

## 2017-03-03 MED ORDER — AMLODIPINE BESYLATE 10 MG PO TABS: 10.0000 mg | ORAL_TABLET | Freq: Every day | ORAL | 0 refills | Status: DC

## 2017-03-03 MED ORDER — LOSARTAN POTASSIUM-HCTZ 100-25 MG PO TABS: ORAL_TABLET | ORAL | 0 refills | Status: DC

## 2017-03-03 NOTE — Telephone Encounter (Signed)
Patient walked in needing refill on Losartan and Amlodipine  Rx for 14 day Losartan sent to [facility] and Amlodipine 90 to mail order. Losartan 90 day already sent to mail order today but she is completely out  Patient scheduled follow up appointment while she was here

## 2017-03-03 NOTE — Telephone Encounter (Signed)
REFILL 

## 2017-03-11 ENCOUNTER — Ambulatory Visit (INDEPENDENT_AMBULATORY_CARE_PROVIDER_SITE_OTHER): Payer: Medicare Other | Admitting: Cardiovascular Disease

## 2017-03-11 VITALS — BP 128/58 | HR 50 | Ht 68.0 in | Wt 169.0 lb

## 2017-03-11 DIAGNOSIS — E785 Hyperlipidemia, unspecified: Secondary | ICD-10-CM | POA: Diagnosis not present

## 2017-03-11 DIAGNOSIS — I10 Essential (primary) hypertension: Secondary | ICD-10-CM

## 2017-03-11 DIAGNOSIS — Z79899 Other long term (current) drug therapy: Secondary | ICD-10-CM

## 2017-03-11 DIAGNOSIS — M25472 Effusion, left ankle: Secondary | ICD-10-CM | POA: Diagnosis not present

## 2017-03-11 DIAGNOSIS — I2510 Atherosclerotic heart disease of native coronary artery without angina pectoris: Secondary | ICD-10-CM | POA: Diagnosis not present

## 2017-03-11 DIAGNOSIS — M25471 Effusion, right ankle: Secondary | ICD-10-CM

## 2017-03-11 DIAGNOSIS — R001 Bradycardia, unspecified: Secondary | ICD-10-CM

## 2017-03-11 MED ORDER — ROSUVASTATIN CALCIUM 10 MG PO TABS: 10.0000 mg | ORAL_TABLET | Freq: Every day | ORAL | 3 refills | Status: DC

## 2017-03-11 MED ORDER — SPIRONOLACTONE 25 MG PO TABS: 25.0000 mg | ORAL_TABLET | Freq: Every day | ORAL | 3 refills | Status: DC

## 2017-03-11 NOTE — Patient Instructions (Signed)
Your physician recommends that you return for lab work in: 2 weeks fasting.  Your physician has recommended you make the following change in your medication:   1.) start the new spironolactone 25 mg. Take 1/2 tablet daily for 1 week then increase to 25 mg daily.  Your physician recommends that you schedule a follow-up appointment in: 3 months with Dr Claiborne Billings.

## 2017-03-11 NOTE — Progress Notes (Signed)
Patient ID: LAYANNA CHARO, female   DOB: 1941-01-08, 76 y.o.   MRN: [id number]    Primary MD: Dr. Alysia Penna  HPI: JULEEN SORRELS is a 76 y.o. female who presents for a 15 month follow-up cardiology evaluation.  Ms. Delehanty has CAD and in November 2000 underwent stenting of a high-grade LAD stenosis with an S670 3.518 mm bare-metal stent. A nuclear perfusion study in November 2013 was unchanged from previously and continued to show normal perfusion without scar or ischemia. Ejection fraction was 68%. An echo Doppler study revealed an ejection fraction in the 55-60% range with grade 1 diastolic dysfunction. 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  Additional problems include hypertension as well as hyperlipidemia. Over the past several months she has noticed that she is more tired.  She also has noticed more shortness of breath with activity.  She has noted some vague indigestion symptoms.  She admits to some mild ankle swelling.  She has been taking Crestor 10 mg and denies myalgias.  She has been on losartan HCT 100/25 as well as amlodipine 10 mg and Lasix 20 mg for blood pressure and peripheral edema.  When I saw her in November.  Her blood pressure was controlled.  She was bradycardic not on any rate control medication and I raise the possibility of a component of chronotropic incompetence.  To further evaluate her exertional dyspnea.  She underwent a nuclear perfusion study on 10/06/2014 which was normal.  Post stress ejection fraction was 67%.  An echo Doppler study done on 10/06/2014 showed an EF of 60-65% with moderate left ventricular hypertrophy.  There was grade 1 diastolic dysfunction.  She had indeterminate LV filling pressure.  There was mild aortic sclerosis without stenosis, mitral annular calcification with mild MR, and mild dilatation of her left atrium with mild  tricuspid regurgitation.  Pulmonary pressures were minimally elevated at 31 mm.  She underwent successful left knee surgery by Dr. Lindwood Qua in March 2016.  She tolerated surgery well from a cardiovascular standpoint.  She underwent an echo Doppler study in March 2017 which showed mild LVH with vigorous LV function with an EF of 65-70%.  There was grade 2 diastolic dysfunction.  There was moderate aortic sclerosis without stenosis.  There was mild LA dilation.  PA pressures were upper normal.  Since I last saw her, she denies any chest pain.  She has had issues with ankle swelling.  She is no longer taking furosemide since his had expired several months ago.  She underwent denies any episodes of chest pressure or shortness of breath.  She does have issues with fatigability and being tired.  She feels that her sleep is well.  She denies presyncope or syncope.  She is unaware of any palpitations.  She presents for follow-up evaluation.  Past Medical History:  Diagnosis Date  . Allergy   . CAD (coronary artery disease)    sees Dr. Shelva Majestic  cardiac stents - 2000  . Colon polyps   . Heart murmur   . History of stress test    show normal perfusion without scar or ischemia, post EF 68%  . Hx of echocardiogram    show an EF 55%-60% range with grade 1 diastolic dysfunction, she had mitral anular calcification with mild MR, moderate LA dilation and mild pulmonary hypertension with a PA estimated pressure of 60m  . Hyperlipidemia   .  Hypertension   . Osteoarthritis   . Pneumonia    hx of 2015   . PONV (postoperative nausea and vomiting)     Past Surgical History:  Procedure Laterality Date  . ABDOMINAL HYSTERECTOMY  1971  . COLONOSCOPY  01-05-14   per Dr. Teena Irani, clear, no repeats needed   . CORONARY STENT PLACEMENT  2000   in LAD  . KNEE ARTHROSCOPY Left 01/06/2015   Procedure: LEFT KNEE ARTHROSCOPY, abrasion chondroplasty of the medial femerol condryl,medial and lateral menisectomy,  microfracture , synovectomy of the suprpatellar pouch;  Surgeon: Latanya Maudlin, MD;  Location: [facility];  Service: Orthopedics;  Laterality: Left;    Allergies  Allergen Reactions  . Morphine Other (See Comments)    Body shuts down  . Procaine Hcl Rash    Rash. Anything with caine in it   . Sulfonamide Derivatives Hives  . Vytorin [Ezetimibe-Simvastatin] Other (See Comments)    Pt unsure of allergy     Current Outpatient Prescriptions  Medication Sig Dispense Refill  . amLODipine (NORVASC) 10 MG tablet Take 1 tablet (10 mg total) by mouth daily. 90 tablet 0  . Cholecalciferol (D3-1000) 1000 units capsule Take 1,000 Units by mouth daily.    Marland Kitchen desoximetasone (TOPICORT) 0.25 % cream Apply 1 application topically 2 (two) times daily. 60 g 5  . dextromethorphan (DELSYM) 30 MG/5ML liquid Take 15 mg by mouth as needed for cough. Reported on 05/22/2016    . diphenhydramine-acetaminophen (TYLENOL PM) 25-500 MG TABS Take 1 tablet by mouth at bedtime.    . Glucos-Chond-Hyal Ac-Ca Fructo (MOVE FREE JOINT HEALTH ADVANCE PO) Take 1 tablet by mouth daily.    Marland Kitchen ketotifen (ZADITOR) 0.025 % ophthalmic solution 1 drop 2 (two) times daily. Reported on 05/22/2016    . loratadine (CLARITIN) 10 MG tablet Take 10 mg by mouth daily as needed for allergies.     Marland Kitchen losartan-hydrochlorothiazide (HYZAAR) 100-25 MG tablet One tablet by mouth daily 14 tablet 0  . Multiple Vitamin (MULTIVITAMIN WITH MINERALS) TABS tablet Take 1 tablet by mouth daily.    . Omega-3 Fatty Acids (FISH OIL) 1200 MG CPDR Take 1 capsule by mouth daily.    . potassium chloride (MICRO-K) 10 MEQ CR capsule Take 1 capsule (10 mEq total) by mouth 2 (two) times daily. 180 capsule 3  . Propylene Glycol-Glycerin (SOOTHE OP) Apply to eye.    . rosuvastatin (CRESTOR) 10 MG tablet Take 1 tablet (10 mg total) by mouth daily. 90 tablet 3  . triamcinolone (NASACORT) 55 MCG/ACT nasal inhaler Place 1 spray into both nostrils daily as needed (Allergies).     Marland Kitchen  spironolactone (ALDACTONE) 25 MG tablet Take 1 tablet (25 mg total) by mouth daily. 90 tablet 3   No current facility-administered medications for this visit.     Social History   Social History  . Marital status: Married    Spouse name: N/A  . Number of children: N/A  . Years of education: N/A   Occupational History  . Not on file.   Social History Main Topics  . Smoking status: Former Smoker    Quit date: 11/04/1996  . Smokeless tobacco: Never Used  . Alcohol use No  . Drug use: No  . Sexual activity: Not on file   Other Topics Concern  . Not on file   Social History Narrative  . No narrative on file    Family History  Problem Relation Age of Onset  . Cancer Maternal Grandmother   .  Heart disease Maternal Grandfather     ROS General: Negative; No fevers, chills, or night sweats;  HEENT: Negative; No changes in vision or hearing, sinus congestion, difficulty swallowing Pulmonary: Negative; No cough, wheezing, hemoptysis Cardiovascular: the history of present illness.  Mild exertional shortness of breath Positive for ankle swelling GI: Negative; No nausea, vomiting, diarrhea, or abdominal pain GU: Negative; No dysuria, hematuria, or difficulty voiding Musculoskeletal: history of plantar fasciitis; status post left knee surgery March 2016 no myalgias, joint pain, or weakness Hematologic/Oncology: Negative; no easy bruising, bleeding Endocrine: Negative; no heat/cold intolerance; no diabetes Neuro: Negative; no changes in balance, headaches Skin: Negative; No rashes or skin lesions Psychiatric: Negative; No behavioral problems, depression Sleep: Negative; No snoring, daytime sleepiness, hypersomnolence, bruxism, restless legs, hypnogognic hallucinations, no cataplexy Other comprehensive 14 point system review is negative.   PE BP (!) 128/58   Pulse (!) 50   Ht '5\' 8"'  (1.727 m)   Wt 169 lb (76.7 kg)   BMI 25.70 kg/m    Repeat blood pressure by me 140/64.     Wt Readings from Last 3 Encounters:  03/11/17 169 lb (76.7 kg)  09/04/16 170 lb (77.1 kg)  05/22/16 170 lb (77.1 kg)   General: Alert, oriented, no distress.  Skin: normal turgor, no rashes HEENT: Normocephalic, atraumatic. Pupils round and reactive; sclera anicteric;no lid lag.  Nose without nasal septal hypertrophy Mouth/Parynx benign; Mallinpatti scale 2  Neck: No JVD, no carotid bruits with normal carotid upstroke Lungs: clear to ausculatation and percussion; no wheezing or rales Chest wall: Nontender to palpation Heart: RRR, s1 s2 normal;  2/6 systolic murmur in the aortic area left sternal border Consistent with her aortic sclerosis; No diastolic murmur.  No S3 gallop; .  No rubs thrills or heaves Abdomen: soft, nontender; no hepatosplenomehaly, BS+; abdominal aorta nontender and not dilated by palpation. Back: No CVA tenderness Pulses 2+ Extremities: 2+ bilateral ankle edema, no clubbing or cyanosis, negative Homan's sign  Neurologic: grossly nonfocal Psychologic: normal affect and mood.  ECG (independently read by me): Sinus bradycardia 50 bpm.  LVH with repolarization changes in leads II, III, and F, 1, V3 through V6.  February 2017 ECG (independently read by me): Probable low atrial rhythm with a ventricular rate at 49 bpm.  Previous seen noted diffuse T-wave abnormality in leads II, III, and F, V3 through V6, and lead 1.  February 2016 ECG (independently read by me): Marked sinus bradycardia at 45 bpm with LVH and diffuse T-wave inversion II, III, and F lead 1, and V3 through V6; minimally changed from one year previously.   November 2014 ECG: Sinus bradycardia with sinus arrhythmia. Previously noted LVH with marked repolarization T-wave abnormalities which have been present previously.  LABS: BMP Latest Ref Rng & Units 08/21/2016 01/24/2016 07/27/2015  Glucose 70 - 99 mg/dL 84 85 94  BUN 6 - 23 mg/dL '16 18 14  ' Creatinine 0.40 - 1.20 mg/dL 0.72 0.80 0.71  Sodium 135 -  145 mEq/L 140 140 139  Potassium 3.5 - 5.1 mEq/L 3.7 4.1 3.8  Chloride 96 - 112 mEq/L 102 103 103  CO2 19 - 32 mEq/L 32 28 30  Calcium 8.4 - 10.5 mg/dL 10.0 9.6 9.7   Hepatic Function Latest Ref Rng & Units 08/21/2016 01/24/2016 07/27/2015  Total Protein 6.0 - 8.3 g/dL 7.4 6.6 7.0  Albumin 3.5 - 5.2 g/dL 4.2 4.0 4.1  AST 0 - 37 U/L '22 22 25  ' ALT 0 - 35 U/L 17 18  20  Alk Phosphatase 39 - 117 U/L 71 70 78  Total Bilirubin 0.2 - 1.2 mg/dL 0.5 0.5 0.5  Bilirubin, Direct 0.0 - 0.3 mg/dL 0.1 - 0.1   CBC Latest Ref Rng & Units 08/21/2016 01/24/2016 07/27/2015  WBC 4.0 - 10.5 K/uL 4.4 4.7 4.2  Hemoglobin 12.0 - 15.0 g/dL 14.1 14.6 13.8  Hematocrit 36.0 - 46.0 % 41.9 42.5 41.0  Platelets 150.0 - 400.0 K/uL 208.0 210 184.0   Lab Results  Component Value Date   MCV 85.7 08/21/2016   MCV 86.2 01/24/2016   MCV 87.3 07/27/2015   Lab Results  Component Value Date   TSH 1.64 08/21/2016   No results found for: HGBA1C  Lipid Panel     Component Value Date/Time   CHOL 137 08/21/2016 0936   TRIG 116.0 08/21/2016 0936   HDL 52.30 08/21/2016 0936   CHOLHDL 3 08/21/2016 0936   VLDL 23.2 08/21/2016 0936   LDLCALC 62 08/21/2016 0936    RADIOLOGY: No results found.  IMPRESSION:  1. Atherosclerosis of native coronary artery of native heart without angina pectoris   2. Essential hypertension   3. Hyperlipidemia LDL goal <70   4. Medication management   5. Ankle edema, bilateral   6. Bradycardia     ASSESSMENT AND PLAN: Ms. Grasse is a 76 year old African-American female who is 18 years status post stenting of a high-grade proximal LAD stenosis with insertion of an S670 3.5x18 mm bare-metal stent.  She has a history of hypertension and has been on amlodipine 10 mg, losartan HCT 100/25 mg and it previously had a prescription for furosemide which apparently has expired.  Her blood pressure today is mildly elevated by me but was normal on presentation.  With her documented diastolic  dysfunction, I am adding spironolactone and she will initiate this at 12.5 mg for one week and then titrate to 12.5 mg twice a day.  Patient also help her 2+ ankle edema.  We discussed potential compressions stockings.  She is on Crestor 10 mg for hyperlipidemia with target LDL less than 70.  LDL in October 2017, was excellent at 27.  She's not having anginal symptomatology on her current medical regimen..  She has been on aregimen consisting of amlodipine 10 mg, furosemide 20 mg, and losartan HCT 100/25 mg.  She is bradycardic but asymptomatic and is not on any rate slowing medications.   Her last nuclear perfusion study continued to show normal perfusion and argues against CAD progression, scar/ischemia.  She will undergo repeat laboratory the fasting state.  I will see her in 3 months for reevaluation.  Time spent: 25 minutes Troy Sine, MD, [facility]  03/13/2017 4:27 PM

## 2017-03-13 ENCOUNTER — Encounter: Payer: Self-pay | Admitting: Cardiovascular Disease

## 2017-04-07 LAB — COMPREHENSIVE METABOLIC PANEL
ALT: 20 U/L (ref 6–29)
AST: 21 U/L (ref 10–35)
Albumin: 4 g/dL (ref 3.6–5.1)
Alkaline Phosphatase: 76 U/L (ref 33–130)
BUN: 22 mg/dL (ref 7–25)
CO2: 25 mmol/L (ref 20–31)
Calcium: 10.1 mg/dL (ref 8.6–10.4)
Chloride: 103 mmol/L (ref 98–110)
Creat: 0.81 mg/dL (ref 0.60–0.93)
Glucose, Bld: 95 mg/dL (ref 65–99)
Potassium: 4.6 mmol/L (ref 3.5–5.3)
Sodium: 135 mmol/L (ref 135–146)
Total Bilirubin: 0.6 mg/dL (ref 0.2–1.2)
Total Protein: 7 g/dL (ref 6.1–8.1)

## 2017-04-07 LAB — LIPID PANEL
Cholesterol: 131 mg/dL (ref <200)
HDL: 68 mg/dL (ref >50)
LDL Cholesterol: 50 mg/dL (ref <100)
Total CHOL/HDL Ratio: 1.9 Ratio (ref <5.0)
Triglycerides: 66 mg/dL (ref <150)
VLDL: 13 mg/dL (ref <30)

## 2017-04-07 LAB — CBC
HCT: 45.6 % — ABNORMAL HIGH (ref 35.0–45.0)
Hemoglobin: 15.1 g/dL (ref 11.7–15.5)
MCH: 29.1 pg (ref 27.0–33.0)
MCHC: 33.1 g/dL (ref 32.0–36.0)
MCV: 87.9 fL (ref 80.0–100.0)
MPV: 9.2 fL (ref 7.5–12.5)
Platelets: 264 10*3/uL (ref 140–400)
RBC: 5.19 MIL/uL — ABNORMAL HIGH (ref 3.80–5.10)
RDW: 13.1 % (ref 11.0–15.0)
WBC: 6.7 10*3/uL (ref 3.8–10.8)

## 2017-04-07 LAB — TSH: TSH: 1.41 mIU/L

## 2017-04-24 ENCOUNTER — Telehealth: Payer: Self-pay | Admitting: Cardiovascular Disease

## 2017-04-24 NOTE — Telephone Encounter (Signed)
Pt would like her lab results °

## 2017-04-24 NOTE — Telephone Encounter (Signed)
Left message for pt to call.

## 2017-04-25 NOTE — Telephone Encounter (Signed)
F/u Message ° °Pt returning Rn call. Please call back to discuss  °

## 2017-04-25 NOTE — Telephone Encounter (Signed)
Returned pt call. She voiced she is still having the fatigue which she saw Dr. Claiborne Billings for last time. Noted that her last bloodwork was drawn on 6/4 - I informed her Dr. Claiborne Billings hasn't submitted a note, but that I didn't see anything outstanding/abnormal on labs - would need MD to verify and advise on any med changes, addtl labwork, etc.  Pt voiced understanding and thanks. Dr. Claiborne Billings, please advise if anything further needed for patient.

## 2017-04-27 NOTE — Telephone Encounter (Signed)
Labs are all excellent

## 2017-04-28 ENCOUNTER — Encounter: Payer: Self-pay | Admitting: *Deleted

## 2017-04-29 NOTE — Telephone Encounter (Signed)
See result note - Letter sent to patient.

## 2017-05-10 ENCOUNTER — Other Ambulatory Visit: Payer: Self-pay | Admitting: Family Medicine

## 2017-05-12 NOTE — Telephone Encounter (Signed)
Can we refill this? 

## 2017-06-02 ENCOUNTER — Other Ambulatory Visit: Payer: Self-pay | Admitting: Cardiovascular Disease

## 2017-06-13 ENCOUNTER — Encounter: Payer: Self-pay | Admitting: Cardiovascular Disease

## 2017-06-13 ENCOUNTER — Ambulatory Visit (INDEPENDENT_AMBULATORY_CARE_PROVIDER_SITE_OTHER): Payer: Medicare Other | Admitting: Cardiovascular Disease

## 2017-06-13 VITALS — BP 140/68 | HR 49 | Ht 67.5 in | Wt 168.0 lb

## 2017-06-13 DIAGNOSIS — E785 Hyperlipidemia, unspecified: Secondary | ICD-10-CM

## 2017-06-13 DIAGNOSIS — M25472 Effusion, left ankle: Secondary | ICD-10-CM | POA: Diagnosis not present

## 2017-06-13 DIAGNOSIS — Z79899 Other long term (current) drug therapy: Secondary | ICD-10-CM | POA: Diagnosis not present

## 2017-06-13 DIAGNOSIS — I10 Essential (primary) hypertension: Secondary | ICD-10-CM | POA: Diagnosis not present

## 2017-06-13 DIAGNOSIS — M25471 Effusion, right ankle: Secondary | ICD-10-CM

## 2017-06-13 DIAGNOSIS — I2510 Atherosclerotic heart disease of native coronary artery without angina pectoris: Secondary | ICD-10-CM | POA: Diagnosis not present

## 2017-06-13 MED ORDER — SPIRONOLACTONE 25 MG PO TABS: 25.0000 mg | ORAL_TABLET | Freq: Two times a day (BID) | ORAL | 3 refills | Status: DC

## 2017-06-13 MED ORDER — AMLODIPINE BESYLATE 10 MG PO TABS: 5.0000 mg | ORAL_TABLET | Freq: Every day | ORAL | 0 refills | Status: DC

## 2017-06-13 MED ORDER — LOSARTAN POTASSIUM-HCTZ 100-25 MG PO TABS: ORAL_TABLET | ORAL | 3 refills | Status: DC

## 2017-06-13 NOTE — Progress Notes (Signed)
Patient ID: Barbara [name], female   DOB: [birth year], 76 y.o.   MRN: [id number]    Primary MD: Dr. Alysia Penna  HPI: Barbara [name] is a 76 y.o. female who presents for a 3 month follow-up cardiology evaluation.  Barbara [name] has CAD and in November 2000 underwent stenting of a high-grade LAD stenosis with an S670 3.518 mm bare-metal stent. A nuclear perfusion study in November 2013 was unchanged from previously and continued to show normal perfusion without scar or ischemia. Ejection fraction was 68%. An echo Doppler study revealed an ejection fraction in the 55-60% range with grade 1 diastolic dysfunction. 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  Additional problems include hypertension as well as hyperlipidemia. Over the past several months she has noticed that she is more tired.  She also has noticed more shortness of breath with activity.  She has noted some vague indigestion symptoms.  She admits to some mild ankle swelling.  She has been taking Crestor 10 mg and denies myalgias.  She has been on losartan HCT 100/25 as well as amlodipine 10 mg and Lasix 20 mg for blood pressure and peripheral edema.  When I saw her in November.  Her blood pressure was controlled.  She was bradycardic not on any rate control medication and I raise the possibility of a component of chronotropic incompetence.  To further evaluate her exertional dyspnea.  She underwent a nuclear perfusion study on 10/06/2014 which was normal.  Post stress ejection fraction was 67%.  An echo Doppler study done on 10/06/2014 showed an EF of 60-65% with moderate left ventricular hypertrophy.  There was grade 1 diastolic dysfunction.  She had indeterminate LV filling pressure.  There was mild aortic sclerosis without stenosis, mitral annular calcification with mild MR, and mild dilatation of her left atrium with mild  tricuspid regurgitation.  Pulmonary pressures were minimally elevated at 31 mm.  She underwent successful left knee surgery by Dr. Lindwood Qua in March 2016.  She tolerated surgery well from a cardiovascular standpoint.  An echo Doppler study in March 2017 showed mild LVH with vigorous LV function with an EF of 65-70%.  There was grade 2 diastolic dysfunction.  There was moderate aortic sclerosis without stenosis.  There was mild LA dilation.  PA pressures were upper normal.  When I last saw her, she had noticed issues with ankle swelling.  She had stopped taking furosemide since his had expired.  I elected to institute spironolactone with her moderate diastolic dysfunction.  Presently she continues to be on amlodipine 10 mg, losartan HCT 100/25 mg and she has been on spironolactone 25 mg daily.  She has continued to be on potassium chloride, fish oil, vitamin D.  She continues to have swelling of her ankles and feet.  Her shortness of breath has improved.  She believes she is sleeping well and wakes up 1 time per night for nocturia.  She is unaware of palpitations.  She presents for follow-up evaluation.  Past Medical History:  Diagnosis Date  . Allergy   . CAD (coronary artery disease)    sees Dr. Shelva Majestic  cardiac stents - 2000  . Colon polyps   . Heart murmur   . History of stress test    show normal perfusion without scar or ischemia, post EF 68%  . Hx of echocardiogram    show an EF 55%-60% range with grade 1  diastolic dysfunction, she had mitral anular calcification with mild MR, moderate LA dilation and mild pulmonary hypertension with a PA estimated pressure of 74m  . Hyperlipidemia   . Hypertension   . Osteoarthritis   . Pneumonia    hx of 2015   . PONV (postoperative nausea and vomiting)     Past Surgical History:  Procedure Laterality Date  . ABDOMINAL HYSTERECTOMY  1971  . COLONOSCOPY  01-05-14   per Dr. JTeena Irani clear, no repeats needed   . CORONARY STENT PLACEMENT   2000   in LAD  . KNEE ARTHROSCOPY Left 01/06/2015   Procedure: LEFT KNEE ARTHROSCOPY, abrasion chondroplasty of the medial femerol condryl,medial and lateral menisectomy, microfracture , synovectomy of the suprpatellar pouch;  Surgeon: RLatanya Maudlin MD;  Location: [facility];  Service: Orthopedics;  Laterality: Left;    Allergies  Allergen Reactions  . Lidocaine Anaphylaxis  . Morphine Other (See Comments)    Body shuts down  . Tape Itching  . Procaine Hcl Rash    Rash. Anything with caine in it   . Sulfonamide Derivatives Hives  . Vytorin [Ezetimibe-Simvastatin] Other (See Comments)    Pt unsure of allergy     Current Outpatient Prescriptions  Medication Sig Dispense Refill  . amLODipine (NORVASC) 10 MG tablet Take 0.5 tablets (5 mg total) by mouth daily. 90 tablet 0  . Cholecalciferol (D3-1000) 1000 units capsule Take 1,000 Units by mouth daily.    .Marland Kitchendesoximetasone (TOPICORT) 0.25 % cream Apply 1 application topically 2 (two) times daily. 60 g 5  . dextromethorphan (DELSYM) 30 MG/5ML liquid Take 15 mg by mouth as needed for cough. Reported on 05/22/2016    . diphenhydramine-acetaminophen (TYLENOL PM) 25-500 MG TABS Take 1 tablet by mouth at bedtime.    . Glucos-Chond-Hyal Ac-Ca Fructo (MOVE FREE JOINT HEALTH ADVANCE PO) Take 1 tablet by mouth daily.    .Marland Kitchenketotifen (ZADITOR) 0.025 % ophthalmic solution 1 drop 2 (two) times daily. Reported on 05/22/2016    . loratadine (CLARITIN) 10 MG tablet Take 10 mg by mouth daily as needed for allergies.     .Marland Kitchenlosartan-hydrochlorothiazide (HYZAAR) 100-25 MG tablet One tablet by mouth daily 90 tablet 3  . Multiple Vitamin (MULTIVITAMIN WITH MINERALS) TABS tablet Take 1 tablet by mouth daily.    . Omega-3 Fatty Acids (FISH OIL) 1200 MG CPDR Take 1 capsule by mouth daily.    .Marland KitchenPropylene Glycol-Glycerin (SOOTHE OP) Apply to eye.    . rosuvastatin (CRESTOR) 10 MG tablet Take 1 tablet (10 mg total) by mouth daily. 90 tablet 3  . spironolactone (ALDACTONE)  25 MG tablet Take 1 tablet (25 mg total) by mouth 2 (two) times daily. 180 tablet 3  . triamcinolone (NASACORT) 55 MCG/ACT nasal inhaler Place 1 spray into both nostrils daily as needed (Allergies).      No current facility-administered medications for this visit.     Social History   Social History  . Marital status: Married    Spouse name: N/A  . Number of children: N/A  . Years of education: N/A   Occupational History  . Not on file.   Social History Main Topics  . Smoking status: Former Smoker    Quit date: 11/04/1996  . Smokeless tobacco: Never Used  . Alcohol use No  . Drug use: No  . Sexual activity: Not on file   Other Topics Concern  . Not on file   Social History Narrative  . No narrative on file  Family History  Problem Relation Age of Onset  . Cancer Maternal Grandmother   . Heart disease Maternal Grandfather     ROS General: Negative; No fevers, chills, or night sweats;  HEENT: Negative; No changes in vision or hearing, sinus congestion, difficulty swallowing Pulmonary: Negative; No cough, wheezing, hemoptysis Cardiovascular: the history of present illness.  Mild exertional shortness of breath Positive for ankle swelling GI: Negative; No nausea, vomiting, diarrhea, or abdominal pain GU: Negative; No dysuria, hematuria, or difficulty voiding Musculoskeletal: history of plantar fasciitis; status post left knee surgery March 2016 no myalgias, joint pain, or weakness Hematologic/Oncology: Negative; no easy bruising, bleeding Endocrine: Negative; no heat/cold intolerance; no diabetes Neuro: Negative; no changes in balance, headaches Skin: Negative; No rashes or skin lesions Psychiatric: Negative; No behavioral problems, depression Sleep: Negative; No snoring, daytime sleepiness, hypersomnolence, bruxism, restless legs, hypnogognic hallucinations, no cataplexy Other comprehensive 14 point system review is negative.   PE BP 140/68   Pulse (!) 49   Ht  5' 7.5" (1.715 m)   Wt 168 lb (76.2 kg)   BMI 25.92 kg/m    Repeat blood pressure by me 128/68  Wt Readings from Last 3 Encounters:  06/13/17 168 lb (76.2 kg)  03/11/17 169 lb (76.7 kg)  09/04/16 170 lb (77.1 kg)   General: Alert, oriented, no distress.  Skin: normal turgor, no rashes, warm and dry HEENT: Normocephalic, atraumatic. Pupils equal round and reactive to light; sclera anicteric; extraocular muscles intact;  Nose without nasal septal hypertrophy Mouth/Parynx benign; Mallinpatti scale 2 Neck: No JVD, no carotid bruits; normal carotid upstroke Lungs: clear to ausculatation and percussion; no wheezing or rales Chest wall: without tenderness to palpitation Heart: PMI not displaced, RRR, s1 s2 normal, 2/6 systolic murmur, no diastolic murmur, no rubs, gallops, thrills, or heaves Abdomen: soft, nontender; no hepatosplenomehaly, BS+; abdominal aorta nontender and not dilated by palpation. Back: no CVA tenderness Pulses 2+ Musculoskeletal: full range of motion, normal strength, no joint deformities Extremities: Chronic 1-2+ bilateral ankle and feet swelling no clubbing cyanosis, Homan's sign negative  Neurologic: grossly nonfocal; Cranial nerves grossly wnl Psychologic: Normal mood and affect  ECG (independently read by me): Sinus bradycardia with PAC.  LVH with repolarization changes.  May 2018 ECG (independently read by me): Sinus bradycardia 50 bpm.  LVH with repolarization changes in leads II, III, and F, 1, V3 through V6.  February 2017 ECG (independently read by me): Probable low atrial rhythm with a ventricular rate at 49 bpm.  Previous seen noted diffuse T-wave abnormality in leads II, III, and F, V3 through V6, and lead 1.  February 2016 ECG (independently read by me): Marked sinus bradycardia at 45 bpm with LVH and diffuse T-wave inversion II, III, and F lead 1, and V3 through V6; minimally changed from one year previously.   November 2014 ECG: Sinus bradycardia with  sinus arrhythmia. Previously noted LVH with marked repolarization T-wave abnormalities which have been present previously.  LABS: BMP Latest Ref Rng & Units 04/07/2017 08/21/2016 01/24/2016  Glucose 65 - 99 mg/dL 95 84 85  BUN 7 - 25 mg/dL _0 Creatinine 0.60 - 0.93 mg/dL 0.81 0.72 0.80  Sodium 135 - 146 mmol/L 135 140 140  Potassium 3.5 - 5.3 mmol/L 4.6 3.7 4.1  Chloride 98 - 110 mmol/L 103 102 103  CO2 20 - 31 mmol/L 25 32 28  Calcium 8.6 - 10.4 mg/dL 10.1 10.0 9.6   Hepatic Function Latest Ref Rng & Units 04/07/2017 08/21/2016  01/24/2016  Total Protein 6.1 - 8.1 g/dL 7.0 7.4 6.6  Albumin 3.6 - 5.1 g/dL 4.0 4.2 4.0  AST 10 - 35 U/L _0 ALT 6 - 29 U/L _1 Alk Phosphatase 33 - 130 U/L 76 71 70  Total Bilirubin 0.2 - 1.2 mg/dL 0.6 0.5 0.5  Bilirubin, Direct 0.0 - 0.3 mg/dL - 0.1 -   CBC Latest Ref Rng & Units 04/07/2017 08/21/2016 01/24/2016  WBC 3.8 - 10.8 K/uL 6.7 4.4 4.7  Hemoglobin 11.7 - 15.5 g/dL 15.1 14.1 14.6  Hematocrit 35.0 - 45.0 % 45.6(H) 41.9 42.5  Platelets 140 - 400 K/uL 264 208.0 210   Lab Results  Component Value Date   MCV 87.9 04/07/2017   MCV 85.7 08/21/2016   MCV 86.2 01/24/2016   Lab Results  Component Value Date   TSH 1.41 04/07/2017   No results found for: HGBA1C  Lipid Panel     Component Value Date/Time   CHOL 131 04/07/2017 0917   TRIG 66 04/07/2017 0917   HDL 68 04/07/2017 0917   CHOLHDL 1.9 04/07/2017 0917   VLDL 13 04/07/2017 0917   LDLCALC 50 04/07/2017 0917    RADIOLOGY: No results found.  IMPRESSION:  1. Medication management   2. Essential hypertension   3. Ankle edema, bilateral   4. Hyperlipidemia LDL goal <70   5. CAD in native artery     ASSESSMENT AND PLAN: Ms. Pennino is a 76 year old African-American female who is 18 years status post stenting of a high-grade proximal LAD stenosis with insertion of an S670 3.5x18 mm bare-metal stent placed in 2000.  She has a history of hypertension and has been on  amlodipine 10 mg, losartan HCT 100/25 mg and it previously had a prescription for furosemide which apparently has expired.  When I last saw her, she has 2+ bilateral ankle and feet swelling.  She had been off furosemide which had expired for some time.  With her diastolic dysfunction.  I elected to add spironolactone titrated this to 25 mg daily.  Despite taking this.  She has continued to experience ankle swelling daily.  She is not completely avoided sodium and I discussed sodium restriction in her diet.  I am recommending reduction of amlodipine from 10 mg to 5 mg in attempt to reduce her edema and will further titrate spironolactone to 25 g twice a day.  I have recommended she discontinue supplemental potassium.  I renewed her medications.  She continues to be on Crestor 10 mg for hyperlipidemia.  Laboratory in June was reviewed and her LDL was 50.  At that time, creatinine was 0.81 and potassium was 4.6.  TSH was 1.41.  Repeat laboratory will be obtained in 2 weeks with a bmet.  I have recommended that she wear compression socks her ankles, feet.  I will see her in 6 months for reevaluation or sooner if problems arise.    Time spent: 25 minutes Troy Sine, MD, [facility]  06/13/2017 3:17 PM

## 2017-06-13 NOTE — Patient Instructions (Addendum)
Medication Instructions:  STOP potassium  DECREASE amlodipine (Norvasc) to 5mg  or 1/2 tablet daily.  INCREASE spironolactone to 25mg  (1 tablet) 2 times daily  Labwork: Return for blood work in 2 weeks (BMET)  Follow-Up: Your physician wants you to follow-up in: 6 MONTHS with Dr. Claiborne Billings. You will receive a reminder letter in the mail two months in advance. If you don't receive a letter, please call our office to schedule the follow-up appointment.   Any Other Special Instructions Will Be Listed Below (If Applicable).   How to Use Compression Stockings Compression stockings are elastic socks that squeeze the legs. They help to increase blood flow to the legs, decrease swelling in the legs, and reduce the chance of developing blood clots in the lower legs. Compression stockings are often used by people who:  Are recovering from surgery.  Have poor circulation in their legs.  Are prone to getting blood clots in their legs.  Have varicose veins.  Sit or stay in bed for long periods of time.  How to use compression stockings Before you put on your compression stockings:  Make sure that they are the correct size. If you do not know your size, ask your health care provider.  Make sure that they are clean, dry, and in good condition.  Check them for rips and tears. Do not put them on if they are ripped or torn.  Put your stockings on first thing in the morning, before you get out of bed. Keep them on for as long as your health care provider advises. When you are wearing your stockings:  Keep them as smooth as possible. Do not allow them to bunch up. It is especially important to prevent the stockings from bunching up around your toes or behind your knees.  Do not roll the stockings downward and leave them rolled down. This can decrease blood flow to your leg.  Change them right away if they become wet or dirty. 1.   When you take off your stockings, inspect your legs and feet.  Anything that does not seem normal may require medical attention. Look for:  Open sores.  Red spots.  Swelling.  Information and tips  Do not stop wearing your compression stockings without talking to your health care provider first.  Wash your stockings every day with mild detergent in cold or warm water. Do not use bleach. Air-dry your stockings or dry them in a clothes dryer on low heat.  Replace your stockings every 3-6 months.  If skin moisturizing is part of your treatment plan, apply lotion or cream at night so that your skin will be dry when you put on the stockings in the morning. It is harder to put the stockings on when you have lotion on your legs or feet. Contact a health care provider if: Remove your stockings and seek medical care if:  You have a feeling of pins and needles in your feet or legs.  You have any new changes in your skin.  You have skin lesions that are getting worse.  You have swelling or pain that is getting worse.  Get help right away if:  You have numbness or tingling in your lower legs that does not get better right after you take the stockings off.  Your toes or feet become cold and blue.  You develop open sores or red spots on your legs that do not go away.  You see or feel a warm spot on your leg.  You have new swelling or soreness in your leg.  You are short of breath or you have chest pain for no reason.  You have a rapid or irregular heartbeat.  You feel light-headed or dizzy. This information is not intended to replace advice given to you by your health care provider. Make sure you discuss any questions you have with your health care provider. Document Released: 08/18/2009 Document Revised: 03/20/2016 Document Reviewed: 09/28/2014 Elsevier Interactive Patient Education  [employer].    If you need a refill on your cardiac medications before your next appointment, please call your pharmacy.

## 2017-06-16 NOTE — Addendum Note (Signed)
Addended by: Zebedee Iba on: 06/16/2017 08:52 AM   Modules accepted: Orders

## 2017-06-28 LAB — BASIC METABOLIC PANEL
BUN/Creatinine Ratio: 20 (ref 12–28)
BUN: 16 mg/dL (ref 8–27)
CO2: 25 mmol/L (ref 20–29)
Calcium: 10.2 mg/dL (ref 8.7–10.3)
Chloride: 102 mmol/L (ref 96–106)
Creatinine, Ser: 0.81 mg/dL (ref 0.57–1.00)
GFR calc Af Amer: 80 mL/min/{1.73_m2} (ref >59)
GFR calc non Af Amer: 70 mL/min/{1.73_m2} (ref >59)
Glucose: 95 mg/dL (ref 65–99)
Potassium: 4.4 mmol/L (ref 3.5–5.2)
Sodium: 139 mmol/L (ref 134–144)

## 2017-07-15 ENCOUNTER — Telehealth: Payer: Self-pay | Admitting: *Deleted

## 2017-07-15 MED ORDER — AMLODIPINE BESYLATE 5 MG PO TABS: 5.0000 mg | ORAL_TABLET | Freq: Every day | ORAL | 1 refills | Status: DC

## 2017-07-15 NOTE — Telephone Encounter (Signed)
Spoke to patient, she currently takes 1/2 of a 10mg  amlodipine daily (recent dose change when see 1 month ago). She is requesting to take her amlodipine as a whole tab every other day bc the tablets break up when cut. I recommended that we could just send this med in for the whole 5mg  tablet, and she could take this instead. Pt was agreeable to this & requested it go to [facility] - declined need for local pharmacy Rx. Rx sent in for new tablet size w equal dosing.

## 2017-08-05 ENCOUNTER — Other Ambulatory Visit: Payer: Self-pay | Admitting: Family Medicine

## 2017-08-06 ENCOUNTER — Telehealth: Payer: Self-pay | Admitting: Family Medicine

## 2017-08-06 NOTE — Telephone Encounter (Signed)
Received PA request for Desoximetas 0.25% cream. PA submitted & pending. Key: [id number]

## 2017-08-08 NOTE — Telephone Encounter (Signed)
The prior authorization request has been denied. This cream is considered an excluded medication.

## 2017-08-08 NOTE — Telephone Encounter (Signed)
I spoke with pt, she will call pharmacy to check on price or if there is alternative cream that is covered, will contact us if we need to do anything further.

## 2017-08-14 ENCOUNTER — Ambulatory Visit (INDEPENDENT_AMBULATORY_CARE_PROVIDER_SITE_OTHER): Payer: Medicare Other

## 2017-08-14 DIAGNOSIS — Z23 Encounter for immunization: Secondary | ICD-10-CM

## 2017-08-26 ENCOUNTER — Encounter: Payer: Self-pay | Admitting: Family Medicine

## 2017-11-06 ENCOUNTER — Ambulatory Visit (INDEPENDENT_AMBULATORY_CARE_PROVIDER_SITE_OTHER): Payer: Medicare Other | Admitting: Family Medicine

## 2017-11-06 ENCOUNTER — Encounter: Payer: Self-pay | Admitting: Family Medicine

## 2017-11-06 VITALS — BP 130/88 | HR 108 | Temp 98.1°F | Ht 66.5 in | Wt 165.2 lb

## 2017-11-06 DIAGNOSIS — I10 Essential (primary) hypertension: Secondary | ICD-10-CM

## 2017-11-06 DIAGNOSIS — R202 Paresthesia of skin: Secondary | ICD-10-CM

## 2017-11-06 DIAGNOSIS — M159 Polyosteoarthritis, unspecified: Secondary | ICD-10-CM

## 2017-11-06 DIAGNOSIS — R200 Anesthesia of skin: Secondary | ICD-10-CM

## 2017-11-06 DIAGNOSIS — K219 Gastro-esophageal reflux disease without esophagitis: Secondary | ICD-10-CM

## 2017-11-06 DIAGNOSIS — E785 Hyperlipidemia, unspecified: Secondary | ICD-10-CM

## 2017-11-06 DIAGNOSIS — M150 Primary generalized (osteo)arthritis: Secondary | ICD-10-CM

## 2017-11-06 DIAGNOSIS — Z23 Encounter for immunization: Secondary | ICD-10-CM

## 2017-11-06 DIAGNOSIS — M8949 Other hypertrophic osteoarthropathy, multiple sites: Secondary | ICD-10-CM

## 2017-11-06 DIAGNOSIS — R1013 Epigastric pain: Secondary | ICD-10-CM

## 2017-11-06 LAB — CBC WITH DIFFERENTIAL/PLATELET
Basophils Absolute: 0 10*3/uL (ref 0.0–0.1)
Basophils Relative: 0.6 % (ref 0.0–3.0)
Eosinophils Absolute: 0.2 10*3/uL (ref 0.0–0.7)
Eosinophils Relative: 3.6 % (ref 0.0–5.0)
HCT: 45.6 % (ref 36.0–46.0)
Hemoglobin: 15.1 g/dL — ABNORMAL HIGH (ref 12.0–15.0)
Lymphocytes Relative: 20.7 % (ref 12.0–46.0)
Lymphs Abs: 1.1 10*3/uL (ref 0.7–4.0)
MCHC: 33.1 g/dL (ref 30.0–36.0)
MCV: 88 fl (ref 78.0–100.0)
Monocytes Absolute: 0.4 10*3/uL (ref 0.1–1.0)
Monocytes Relative: 8 % (ref 3.0–12.0)
Neutro Abs: 3.6 10*3/uL (ref 1.4–7.7)
Neutrophils Relative %: 67.1 % (ref 43.0–77.0)
Platelets: 217 10*3/uL (ref 150.0–400.0)
RBC: 5.18 Mil/uL — ABNORMAL HIGH (ref 3.87–5.11)
RDW: 13.7 % (ref 11.5–15.5)
WBC: 5.3 10*3/uL (ref 4.0–10.5)

## 2017-11-06 LAB — POC URINALSYSI DIPSTICK (AUTOMATED)
Bilirubin, UA: NEGATIVE
Blood, UA: NEGATIVE
Glucose, UA: NEGATIVE
Ketones, UA: NEGATIVE
Leukocytes, UA: NEGATIVE
Nitrite, UA: NEGATIVE
Protein, UA: NEGATIVE
Spec Grav, UA: >=1.03 — AB (ref 1.010–1.025)
Urobilinogen, UA: 0.2 E.U./dL
pH, UA: 6 (ref 5.0–8.0)

## 2017-11-06 LAB — BASIC METABOLIC PANEL
BUN: 19 mg/dL (ref 6–23)
CO2: 30 mEq/L (ref 19–32)
Calcium: 10.3 mg/dL (ref 8.4–10.5)
Chloride: 101 mEq/L (ref 96–112)
Creatinine, Ser: 0.73 mg/dL (ref 0.40–1.20)
GFR: 98.99 mL/min (ref >60.00)
Glucose, Bld: 84 mg/dL (ref 70–99)
Potassium: 4.2 mEq/L (ref 3.5–5.1)
Sodium: 139 mEq/L (ref 135–145)

## 2017-11-06 LAB — LIPID PANEL
Cholesterol: 143 mg/dL (ref 0–200)
HDL: 57.6 mg/dL (ref >39.00)
LDL Cholesterol: 63 mg/dL (ref 0–99)
NonHDL: 85.65
Total CHOL/HDL Ratio: 2
Triglycerides: 111 mg/dL (ref 0.0–149.0)
VLDL: 22.2 mg/dL (ref 0.0–40.0)

## 2017-11-06 LAB — HEPATIC FUNCTION PANEL
ALT: 19 U/L (ref 0–35)
AST: 22 U/L (ref 0–37)
Albumin: 4.3 g/dL (ref 3.5–5.2)
Alkaline Phosphatase: 66 U/L (ref 39–117)
Bilirubin, Direct: 0.1 mg/dL (ref 0.0–0.3)
Total Bilirubin: 0.5 mg/dL (ref 0.2–1.2)
Total Protein: 7.1 g/dL (ref 6.0–8.3)

## 2017-11-06 LAB — TSH: TSH: 1.43 u[IU]/mL (ref 0.35–4.50)

## 2017-11-06 NOTE — Progress Notes (Signed)
   Subjective:    Patient ID: Barbara [name], female    DOB: 11/20/1940, 77 y.o.   MRN: [id number]  HPI Here to follow up on issues. She has a few concerns. First she has had some numbness in the left arm at times for a few weeks. No pain or weakness. Also she has had intermittent epigastric pains for a few weeks. These are mild, and they have a burning quality. Some nausea but no vomiting. She takes OTC Omeprazole but only once in awhile. Her BMs are regular. She had a cataract removed and she sees Dr. Herbert Deaner every 6 months.    Review of Systems  Constitutional: Negative.   HENT: Negative.   Eyes: Negative.   Respiratory: Negative.   Cardiovascular: Negative.   Gastrointestinal: Positive for abdominal pain and nausea. Negative for abdominal distention, anal bleeding, blood in stool, constipation, diarrhea, rectal pain and vomiting.  Genitourinary: Negative for decreased urine volume, difficulty urinating, dyspareunia, dysuria, enuresis, flank pain, frequency, hematuria, pelvic pain and urgency.  Musculoskeletal: Negative.   Skin: Negative.   Neurological: Positive for numbness. Negative for dizziness, tremors, syncope, facial asymmetry, speech difficulty, weakness, light-headedness and headaches.  Psychiatric/Behavioral: Negative.        Objective:   Physical Exam  Constitutional: She is oriented to person, place, and time. She appears well-developed and well-nourished. No distress.  HENT:  Head: Normocephalic and atraumatic.  Right Ear: External ear normal.  Left Ear: External ear normal.  Nose: Nose normal.  Mouth/Throat: Oropharynx is clear and moist. No oropharyngeal exudate.  Eyes: Conjunctivae and EOM are normal. Pupils are equal, round, and reactive to light. No scleral icterus.  Neck: Normal range of motion. Neck supple. No JVD present. No thyromegaly present.  Cardiovascular: Normal rate, regular rhythm, normal heart sounds and intact distal pulses. Exam reveals no  gallop and no friction rub.  No murmur heard. Pulmonary/Chest: Effort normal and breath sounds normal. No respiratory distress. She has no wheezes. She has no rales. She exhibits no tenderness.  Abdominal: Soft. Bowel sounds are normal. She exhibits no distension and no mass. There is no rebound and no guarding.  Mildly tender in the epigastrium   Musculoskeletal: Normal range of motion. She exhibits no edema or tenderness.  Lymphadenopathy:    She has no cervical adenopathy.  Neurological: She is alert and oriented to person, place, and time. She has normal reflexes. No cranial nerve deficit. She exhibits normal muscle tone. Coordination normal.  Skin: Skin is warm and dry. No rash noted. No erythema.  Psychiatric: She has a normal mood and affect. Her behavior is normal. Judgment and thought content normal.          Assessment & Plan:  Her HTN is stable. Her CAD is stable. The left arm numbness seems to be from pinched nerves in the neck. She elected to observe this for now and let us know if it worsens. The epigastric pain is probably related to GERD so I suggested she take the Omeprazole on a daily basis. Get an abdominal US soon to check the gall bladder, etc.  Alysia Penna, MD

## 2017-11-06 NOTE — Progress Notes (Signed)
Subjective:     Patient ID: Barbara [name], female   DOB: [birth year], 77 y.o.   MRN: [id number]  HPI Duplicate  Review of Systems     Objective:   Physical Exam     Assessment:        Plan:

## 2017-11-10 ENCOUNTER — Ambulatory Visit (INDEPENDENT_AMBULATORY_CARE_PROVIDER_SITE_OTHER): Payer: Medicare Other | Admitting: Cardiovascular Disease

## 2017-11-10 ENCOUNTER — Telehealth: Payer: Self-pay | Admitting: Cardiovascular Disease

## 2017-11-10 ENCOUNTER — Encounter: Payer: Self-pay | Admitting: Cardiovascular Disease

## 2017-11-10 VITALS — BP 164/88 | HR 82 | Ht 66.5 in | Wt 166.4 lb

## 2017-11-10 DIAGNOSIS — E785 Hyperlipidemia, unspecified: Secondary | ICD-10-CM | POA: Diagnosis not present

## 2017-11-10 DIAGNOSIS — I10 Essential (primary) hypertension: Secondary | ICD-10-CM

## 2017-11-10 DIAGNOSIS — Z7901 Long term (current) use of anticoagulants: Secondary | ICD-10-CM

## 2017-11-10 DIAGNOSIS — I2510 Atherosclerotic heart disease of native coronary artery without angina pectoris: Secondary | ICD-10-CM

## 2017-11-10 DIAGNOSIS — I4892 Unspecified atrial flutter: Secondary | ICD-10-CM

## 2017-11-10 DIAGNOSIS — Z5181 Encounter for therapeutic drug level monitoring: Secondary | ICD-10-CM

## 2017-11-10 MED ORDER — METOPROLOL TARTRATE 50 MG PO TABS: ORAL_TABLET | ORAL | 3 refills | Status: DC

## 2017-11-10 MED ORDER — APIXABAN 5 MG PO TABS: 5.0000 mg | ORAL_TABLET | Freq: Two times a day (BID) | ORAL | 3 refills | Status: DC

## 2017-11-10 NOTE — Progress Notes (Signed)
Patient ID: Barbara [name], female   DOB: 09-04-1941, 77 y.o.   MRN: [id number]    Primary MD: Dr. Alysia Penna  HPI: Barbara [name] is a 77 y.o. female who presents for a 7 month follow-up cardiology evaluation.  She is seen as an add-on today after she called the office complaining that her heart rate was irregular and episodically fast.  Barbara [name] has CAD and in November 2000 underwent stenting of a high-grade LAD stenosis with an S670 3.518 mm bare-metal stent. A nuclear perfusion study in November 2013 was unchanged from previously and continued to show normal perfusion without scar or ischemia. Ejection fraction was 68%. An echo Doppler study revealed an ejection fraction in the 55-60% range with grade 1 diastolic dysfunction. 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  Additional problems include hypertension as well as hyperlipidemia. Over the past several months she has noticed that she is more tired.  She also has noticed more shortness of breath with activity.  She has noted some vague indigestion symptoms.  She admits to some mild ankle swelling.  She has been taking Crestor 10 mg and denies myalgias.  She has been on losartan HCT 100/25 as well as amlodipine 10 mg and Lasix 20 mg for blood pressure and peripheral edema.  When I saw her in November.  Her blood pressure was controlled.  She was bradycardic not on any rate control medication and I raise the possibility of a component of chronotropic incompetence.  To further evaluate her exertional dyspnea.  She underwent a nuclear perfusion study on 10/06/2014 which was normal.  Post stress ejection fraction was 67%.  An echo Doppler study done on 10/06/2014 showed an EF of 60-65% with moderate left ventricular hypertrophy.  There was grade 1 diastolic dysfunction.  She had indeterminate LV filling pressure.  There was mild  aortic sclerosis without stenosis, mitral annular calcification with mild MR, and mild dilatation of her left atrium with mild tricuspid regurgitation.  Pulmonary pressures were minimally elevated at 31 mm.  She underwent successful left knee surgery by Dr. Lindwood Qua in March 2016.  She tolerated surgery well from a cardiovascular standpoint.  An echo Doppler study in March 2017 showed mild LVH with vigorous LV function with an EF of 65-70%.  There was grade 2 diastolic dysfunction.  There was moderate aortic sclerosis without stenosis.  There was mild LA dilation.  PA pressures were upper normal.  When I  saw her in May 2018, she had noticed issues with ankle swelling.  She had stopped taking furosemide since his had expired.  I elected to institute spironolactone with her moderate diastolic dysfunction.  She was taking amlodipine 10 mg, losartan HCT 100/25 mg and she has been on spironolactone 25 mg daily.    When I last saw her in August 2018.  She was in sinus rhythm and had bilateral ankle and feet swelling.  I reduced her amlodipine to 5 mg and further titrated spironolactone to 25 mg twice a day.  Recently she has been feeling well.  She went to [place] over Christmas and did well.  However, since 11/05/2017.  She has noticed her heart rate being a little faster.  This past Saturday on 11/08/2017.  Her heart rate had spent and also had mild irregularity.  She called the office today.  She was advised to come to the office for an ECG.  Her ECG has revealed atrial flutter with variable block, and I am seeing her as an add-on for further evaluation. .  She denies any chest tightness.  She denies significant shortness of breath.  He denies any history of bleeding.  She had blood work done on 11/06/2017 by primary physician.  Past Medical History:  Diagnosis Date  . Allergy   . CAD (coronary artery disease)    sees Dr. Shelva Majestic  cardiac stents - 2000  . Colon polyps   . Heart murmur   .  History of stress test    show normal perfusion without scar or ischemia, post EF 68%  . Hx of echocardiogram    show an EF 55%-60% range with grade 1 diastolic dysfunction, she had mitral anular calcification with mild MR, moderate LA dilation and mild pulmonary hypertension with a PA estimated pressure of 37m  . Hyperlipidemia   . Hypertension   . Osteoarthritis   . Pneumonia    hx of 2015   . PONV (postoperative nausea and vomiting)     Past Surgical History:  Procedure Laterality Date  . ABDOMINAL HYSTERECTOMY  1971  . COLONOSCOPY  01-05-14   per Dr. JTeena Irani clear, no repeats needed   . CORONARY STENT PLACEMENT  2000   in LAD  . KNEE ARTHROSCOPY Left 01/06/2015   Procedure: LEFT KNEE ARTHROSCOPY, abrasion chondroplasty of the medial femerol condryl,medial and lateral menisectomy, microfracture , synovectomy of the suprpatellar pouch;  Surgeon: RLatanya Maudlin MD;  Location: [facility];  Service: Orthopedics;  Laterality: Left;    Allergies  Allergen Reactions  . Lidocaine Anaphylaxis  . Morphine Other (See Comments)    Body shuts down  . Tape Itching  . Procaine Hcl Rash    Rash. Anything with caine in it   . Sulfonamide Derivatives Hives  . Barbara [name] [Ezetimibe-Simvastatin] Other (See Comments)    Pt unsure of allergy     Current Outpatient Medications  Medication Sig Dispense Refill  . aspirin EC 81 MG tablet Take 162 mg by mouth daily.    . Cholecalciferol (D3-1000) 1000 units capsule Take 1,000 Units by mouth daily.    .Marland Kitchendesoximetasone (TOPICORT) 0.25 % cream APPLY 1 APPLICATION TOPICALLY 2 TIMES DAILY 60 g 5  . dextromethorphan (DELSYM) 30 MG/5ML liquid Take 15 mg by mouth as needed for cough. Reported on 05/22/2016    . Diclofenac Sodium (PENNSAID) 1.5 % SOLN Place onto the skin.    .Marland Kitchendiphenhydramine-acetaminophen (TYLENOL PM) 25-500 MG TABS Take 1 tablet by mouth at bedtime.    . Glucos-Chond-Hyal Ac-Ca Fructo (MOVE FREE JOINT HEALTH ADVANCE PO) Take 1 tablet by mouth  daily.    .Marland Kitchenloratadine (CLARITIN) 10 MG tablet Take 10 mg by mouth daily as needed for allergies.     .Marland Kitchenlosartan-hydrochlorothiazide (HYZAAR) 100-25 MG tablet One tablet by mouth daily 90 tablet 3  . Menthol, Topical Analgesic, (BIOFREEZE) 4 % GEL Apply topically.    . Multiple Vitamin (MULTIVITAMIN WITH MINERALS) TABS tablet Take 1 tablet by mouth daily.    . Omega-3 Fatty Acids (FISH OIL) 1200 MG CPDR Take 1 capsule by mouth daily.    .Marland KitchenPropylene Glycol-Glycerin (SOOTHE OP) Apply to eye.    . rosuvastatin (CRESTOR) 10 MG tablet Take 1 tablet (10 mg total) by mouth daily. 90 tablet 3  . spironolactone (ALDACTONE) 25 MG tablet Take 1 tablet (25 mg total) by mouth 2 (two) times daily. 180 tablet 3  . terbinafine (LAMISIL) 250  MG tablet Take 250 mg by mouth daily.    Marland Kitchen triamcinolone (NASACORT) 55 MCG/ACT nasal inhaler Place 1 spray into both nostrils daily as needed (Allergies).     Marland Kitchen amLODipine (NORVASC) 5 MG tablet Take 1 tablet (5 mg total) by mouth daily. 90 tablet 1  . apixaban (ELIQUIS) 5 MG TABS tablet Take 1 tablet (5 mg total) by mouth 2 (two) times daily. 180 tablet 3  . metoprolol tartrate (LOPRESSOR) 50 MG tablet Take 0.5 tablets (25 mg total) by mouth every morning AND 0.5 tablets (25 mg total) every evening. 90 tablet 3   No current facility-administered medications for this visit.     Social History   Socioeconomic History  . Marital status: Married    Spouse name: Not on file  . Number of children: Not on file  . Years of education: Not on file  . Highest education level: Not on file  Social Needs  . Financial resource strain: Not on file  . Food insecurity - worry: Not on file  . Food insecurity - inability: Not on file  . Transportation needs - medical: Not on file  . Transportation needs - non-medical: Not on file  Occupational History  . Not on file  Tobacco Use  . Smoking status: Former Smoker    Last attempt to quit: 11/04/1996    Years since quitting: 21.0  .  Smokeless tobacco: Never Used  Substance and Sexual Activity  . Alcohol use: No    Alcohol/week: 0.0 oz  . Drug use: No  . Sexual activity: Not on file  Other Topics Concern  . Not on file  Social History Narrative  . Not on file    Family History  Problem Relation Age of Onset  . Cancer Maternal Grandmother   . Heart disease Maternal Grandfather     ROS General: Negative; No fevers, chills, or night sweats;  HEENT: Negative; No changes in vision or hearing, sinus congestion, difficulty swallowing Pulmonary: Negative; No cough, wheezing, hemoptysis Cardiovascular: the history of present illness.  Mild exertional shortness of breath Positive for ankle swelling GI: Negative; No nausea, vomiting, diarrhea, or abdominal pain GU: Negative; No dysuria, hematuria, or difficulty voiding Musculoskeletal: history of plantar fasciitis; status post left knee surgery March 2016 no myalgias, joint pain, or weakness Hematologic/Oncology: Negative; no easy bruising, bleeding Endocrine: Negative; no heat/cold intolerance; no diabetes Neuro: Negative; no changes in balance, headaches Skin: Negative; No rashes or skin lesions Psychiatric: Negative; No behavioral problems, depression Sleep: Negative; No snoring, daytime sleepiness, hypersomnolence, bruxism, restless legs, hypnogognic hallucinations, no cataplexy Other comprehensive 14 point system review is negative.   PE BP (!) 164/88   Pulse 82   Ht 5' 6.5" (1.689 m)   Wt 166 lb 6.4 oz (75.5 kg)   BMI 26.46 kg/m    Repeat blood pressure by me was elevated at 170/86  Wt Readings from Last 3 Encounters:  11/10/17 166 lb 6.4 oz (75.5 kg)  11/06/17 165 lb 3.2 oz (74.9 kg)  06/13/17 168 lb (76.2 kg)   General: Alert, oriented, no distress.  Skin: normal turgor, no rashes, warm and dry HEENT: Normocephalic, atraumatic. Pupils equal round and reactive to light; sclera anicteric; extraocular muscles intact;  Nose without nasal septal  hypertrophy Mouth/Parynx benign; Mallinpatti scale 3 Neck: No JVD, no carotid bruits; normal carotid upstroke Lungs: clear to ausculatation and percussion; no wheezing or rales Chest wall: without tenderness to palpitation Heart: PMI not displaced, mild irregularity with heart rate  ranging from 60-80 bpm, s1 s2 normal, 0-8/8 systolic murmur, no diastolic murmur, no rubs, gallops, thrills, or heaves Abdomen: soft, nontender; no hepatosplenomehaly, BS+; abdominal aorta nontender and not dilated by palpation. Back: no CVA tenderness Pulses 2+ Musculoskeletal: full range of motion, normal strength, no joint deformities Extremities: Improvement in previous 1-2+ lower extremity edema, now trace ankle swelling no clubbing cyanosis or edema, Homan's sign negative  Neurologic: grossly nonfocal; Cranial nerves grossly wnl Psychologic: Normal mood and affect   ECG (independently read by me): Atrial flutter with variable block and average heart rate at 65 bpm.  LVH with repolarization changes.  August 2018 ECG (independently read by me): Sinus bradycardia with PAC.  LVH with repolarization changes.  May 2018 ECG (independently read by me): Sinus bradycardia 50 bpm.  LVH with repolarization changes in leads II, III, and F, 1, V3 through V6.  February 2017 ECG (independently read by me): Probable low atrial rhythm with a ventricular rate at 49 bpm.  Previous seen noted diffuse T-wave abnormality in leads II, III, and F, V3 through V6, and lead 1.  February 2016 ECG (independently read by me): Marked sinus bradycardia at 45 bpm with LVH and diffuse T-wave inversion II, III, and F lead 1, and V3 through V6; minimally changed from one year previously.   November 2014 ECG: Sinus bradycardia with sinus arrhythmia. Previously noted LVH with marked repolarization T-wave abnormalities which have been present previously.  LABS: BMP Latest Ref Rng & Units 11/06/2017 06/27/2017 04/07/2017  Glucose 70 - 99 mg/dL 84 95  95  BUN 6 - 23 mg/dL '19 16 22  ' Creatinine 0.40 - 1.20 mg/dL 0.73 0.81 0.81  BUN/Creat Ratio 12 - 28 - 20 -  Sodium 135 - 145 mEq/L 139 139 135  Potassium 3.5 - 5.1 mEq/L 4.2 4.4 4.6  Chloride 96 - 112 mEq/L 101 102 103  CO2 19 - 32 mEq/L '30 25 25  ' Calcium 8.4 - 10.5 mg/dL 10.3 10.2 10.1   Hepatic Function Latest Ref Rng & Units 11/06/2017 04/07/2017 08/21/2016  Total Protein 6.0 - 8.3 g/dL 7.1 7.0 7.4  Albumin 3.5 - 5.2 g/dL 4.3 4.0 4.2  AST 0 - 37 U/L '22 21 22  ' ALT 0 - 35 U/L '19 20 17  ' Alk Phosphatase 39 - 117 U/L 66 76 71  Total Bilirubin 0.2 - 1.2 mg/dL 0.5 0.6 0.5  Bilirubin, Direct 0.0 - 0.3 mg/dL 0.1 - 0.1   CBC Latest Ref Rng & Units 11/06/2017 04/07/2017 08/21/2016  WBC 4.0 - 10.5 K/uL 5.3 6.7 4.4  Hemoglobin 12.0 - 15.0 g/dL 15.1(H) 15.1 14.1  Hematocrit 36.0 - 46.0 % 45.6 45.6(H) 41.9  Platelets 150.0 - 400.0 K/uL 217.0 264 208.0   Lab Results  Component Value Date   MCV 88.0 11/06/2017   MCV 87.9 04/07/2017   MCV 85.7 08/21/2016   Lab Results  Component Value Date   TSH 1.43 11/06/2017   No results found for: HGBA1C  Lipid Panel     Component Value Date/Time   CHOL 143 11/06/2017 1113   TRIG 111.0 11/06/2017 1113   HDL 57.60 11/06/2017 1113   CHOLHDL 2 11/06/2017 1113   VLDL 22.2 11/06/2017 1113   LDLCALC 63 11/06/2017 1113    RADIOLOGY: No results found.  IMPRESSION:  1. Atrial flutter, unspecified type ([place])   2. Essential hypertension   3. Atherosclerosis of native coronary artery of native heart without angina pectoris   4. Hyperlipidemia LDL goal <70   5.  Alteration in anticoagulation     ASSESSMENT AND PLAN: Ms. Heberlein is a 77 year old African-American female who is 18 years status post stenting of a high-grade proximal LAD stenosis with insertion of an S670 3.5x18 mm bare-metal stent placed in 2000.  She has a history of hypertension and has been on amlodipine, losartan HCT 100/25 mg and it previously had a prescription for furosemide which  apparently has expired.  She had had issues with previous leg edema and when I last saw her amlodipine was reduced to 5 mg.  Her blood pressure today is elevated despite taking amlodipine 5 mg, losartan HCT 100/25 mg, and spironolactone 25 kg twice a day.  She has noticed heart rate increasing with irregularity over the past 5 days.  Her ECG today confirms atrial flutter with variable block.  She had a complete set of laboratory 4 days ago, which I reviewed which essentially were normal.  Hemoglobin and hematocrit were stable.  Electrolytes were normal.  TSH was normal at 1.43.  Lipid studies were excellent on her current dose of rosuvastatin 10 mg with an LDL of 63.  I'm recommending initiation of metoprolol, initially at 25 Mill grams twice a day.  I'm starting her on eliquis and have provided samples of 5 mg to take twice a day.  I'm scheduling her for follow-up echo Doppler study to reassess systolic and diastolic function, previous grade 1 diastolic dysfunction, her prior mild aortic sclerosis, mild trouble annular calcification with mild MR, and atrial dimensions.  In one week she will return for a follow-up ECG and if her heart rate is still increased metoprolol be increased to 50 mg twice a day.  I will see her in 3 weeks for follow-up evaluation or sooner as needed.  If at that time, she still in atrial flutter following 4 weeks of anticoagulation.  Plans are being made for outpatient cardioversion.   Time spent: 25 minutes Troy Sine, MD, [facility]  11/10/2017 6:43 PM

## 2017-11-10 NOTE — Telephone Encounter (Signed)
New Message      STAT if HR is under 50 or over 120 (normal HR is 60-100 beats per minute)  1) What is your heart rate?  Doesn't know   2) Do you have a log of your heart rate readings (document readings)? No   3) Do you have any other symptoms?  Numbness in left arm ,  Feels like she is getting hot , these symptoms started last Wednesday, went in for a check up with her PCP Dr Sarajane Jews,  As long as she stays sitting they ease up, but the moment she stands up it gets worse.

## 2017-11-10 NOTE — Patient Instructions (Signed)
Medication Instructions:  START metoprolol tartrate (Lopressor) 25 mg (1/2 tablet) in the AM and 25 mg (1/2 tablet) in the PM  START Eliquis 5 mg two times daily  Testing/Procedures: Your physician has requested that you have an echocardiogram. Echocardiography is a painless test that uses sound waves to create images of your heart. It provides your doctor with information about the size and shape of your heart and how well your heart's chambers and valves are working. This procedure takes approximately one hour. There are no restrictions for this procedure.  This will be done at our [facility] location:  [place]: Your physician recommends that you schedule a follow-up appointment in: 1 week EKG with nurse and 3 weeks with Dr. Claiborne Billings.   Any Other Special Instructions Will Be Listed Below (If Applicable).     If you need a refill on your cardiac medications before your next appointment, please call your pharmacy.

## 2017-11-10 NOTE — Telephone Encounter (Signed)
Spoke with patient and since Wednesday of last week her heart rate has been jumping all around. HR does go up when she is up moving around. When heart rate fast she is short of breath. Patient in for EKG today and found to be in Atrial Flutter. Patient being seen by Dr Claiborne Billings

## 2017-11-12 ENCOUNTER — Ambulatory Visit: Payer: Medicare Other | Admitting: Cardiovascular Disease

## 2017-11-13 ENCOUNTER — Telehealth: Payer: Self-pay | Admitting: Family Medicine

## 2017-11-13 ENCOUNTER — Telehealth: Payer: Self-pay | Admitting: Cardiovascular Disease

## 2017-11-13 NOTE — Telephone Encounter (Signed)
Sent to PCP ?

## 2017-11-13 NOTE — Telephone Encounter (Signed)
Pt says she is having a mole removed tomorrow morning. She is concerned because she is on Eliquis.Should she stop her Eliquis?

## 2017-11-13 NOTE — Telephone Encounter (Signed)
F/u message  Pt returning call

## 2017-11-13 NOTE — Telephone Encounter (Signed)
   [facility] Pre-operative Risk Assessment    Request for surgical clearance:  1. What type of surgery is being performed? Mole Removal   2. When is this surgery scheduled? 11/14/2017   3. Are there any medications that need to be held prior to surgery and how long?Eliquis   4. Practice name and name of physician performing surgery? Dr Sarajane Jews         5.   What is your office phone and fax number? (P) [phone]     Barbaraann Barthel 11/13/2017, 11:24 AM  _________________________________________________________________   (provider comments below)

## 2017-11-13 NOTE — Telephone Encounter (Signed)
CRM for notification. See Telephone encounter for:   11/13/17.   Relation to pt: self Call back number: [phone]  Reason for call:  Patient states Dr. Claiborne Billings cardiologist prescribed apixaban (ELIQUIS) 5 MG TABS tablet on 11/10/17. Due to medication being a blood thinner, patient unsure if Dr. Sarajane Jews would like her to keep her mole removal appointment for 11/14/17 or [facility], please advise?   In addition patient wanted to make PCP aware specialist also prescribed metoprolol tartrate (LOPRESSOR) 50 MG tablet.

## 2017-11-13 NOTE — Telephone Encounter (Signed)
She can keep this appt. When I numb it up I will use something to slow the bleeding

## 2017-11-13 NOTE — Telephone Encounter (Signed)
Finally reached this preoperative request around [place], by this time, Dr. Sarajane Jews already commented on the eliquis prior to mole removal. Per his note today: "She can keep this appt. When I numb it up I will use something to slow the bleeding". I called the patient, she has not heard this from Dr. Barbie Banner office, but she plans to go there anyway tomorrow.   Please let us know if Dr. Sarajane Jews still need cardiology to comment on Eliquis.   Hilbert Corrigan PA Pager: [phone]

## 2017-11-14 ENCOUNTER — Ambulatory Visit (INDEPENDENT_AMBULATORY_CARE_PROVIDER_SITE_OTHER): Payer: Medicare Other | Admitting: Family Medicine

## 2017-11-14 ENCOUNTER — Encounter: Payer: Self-pay | Admitting: Family Medicine

## 2017-11-14 VITALS — BP 118/50 | HR 40 | Temp 98.1°F | Wt 165.8 lb

## 2017-11-14 DIAGNOSIS — L989 Disorder of the skin and subcutaneous tissue, unspecified: Secondary | ICD-10-CM

## 2017-11-14 NOTE — Telephone Encounter (Signed)
Pt was seen today by Dr. Sarajane Jews

## 2017-11-14 NOTE — Progress Notes (Signed)
   Subjective:    Patient ID: Barbara [name], female    DOB: [birth year], 77 y.o.   MRN: [id number]  HPI Here to treat 2 skin lesions on the left flank. They rub against her clothing and get sore.    Review of Systems  Constitutional: Negative.   Respiratory: Negative.   Cardiovascular: Negative.        Objective:   Physical Exam  Constitutional: She appears well-developed and well-nourished.  Cardiovascular: Normal rate, regular rhythm, normal heart sounds and intact distal pulses.  Pulmonary/Chest: Effort normal and breath sounds normal. No respiratory distress. She has no wheezes. She has no rales.  Skin:  There is a small skin tag and a small seborrheic keratosis on the left flank          Assessment & Plan:  Both lesions were treated with several cycles of cryotherapy.  Alysia Penna, MD

## 2017-11-16 ENCOUNTER — Emergency Department (HOSPITAL_COMMUNITY): Payer: Medicare Other

## 2017-11-16 ENCOUNTER — Encounter (HOSPITAL_COMMUNITY): Payer: Self-pay | Admitting: *Deleted

## 2017-11-16 ENCOUNTER — Inpatient Hospital Stay (HOSPITAL_COMMUNITY)
Admission: EM | Admit: 2017-11-16 | Discharge: 2017-11-24 | DRG: 292 | Disposition: A | Discharge status: Home / Self Care | Payer: Medicare Other | Attending: Internal Medicine | Admitting: Internal Medicine

## 2017-11-16 ENCOUNTER — Other Ambulatory Visit: Payer: Self-pay

## 2017-11-16 DIAGNOSIS — I272 Pulmonary hypertension, unspecified: Secondary | ICD-10-CM | POA: Diagnosis present

## 2017-11-16 DIAGNOSIS — Z882 Allergy status to sulfonamides status: Secondary | ICD-10-CM

## 2017-11-16 DIAGNOSIS — Z8249 Family history of ischemic heart disease and other diseases of the circulatory system: Secondary | ICD-10-CM | POA: Diagnosis not present

## 2017-11-16 DIAGNOSIS — E785 Hyperlipidemia, unspecified: Secondary | ICD-10-CM | POA: Diagnosis present

## 2017-11-16 DIAGNOSIS — I5033 Acute on chronic diastolic (congestive) heart failure: Secondary | ICD-10-CM | POA: Diagnosis present

## 2017-11-16 DIAGNOSIS — I2510 Atherosclerotic heart disease of native coronary artery without angina pectoris: Secondary | ICD-10-CM | POA: Diagnosis present

## 2017-11-16 DIAGNOSIS — I509 Heart failure, unspecified: Secondary | ICD-10-CM | POA: Diagnosis not present

## 2017-11-16 DIAGNOSIS — K219 Gastro-esophageal reflux disease without esophagitis: Secondary | ICD-10-CM | POA: Diagnosis present

## 2017-11-16 DIAGNOSIS — J309 Allergic rhinitis, unspecified: Secondary | ICD-10-CM | POA: Diagnosis not present

## 2017-11-16 DIAGNOSIS — Z955 Presence of coronary angioplasty implant and graft: Secondary | ICD-10-CM | POA: Diagnosis not present

## 2017-11-16 DIAGNOSIS — N179 Acute kidney failure, unspecified: Secondary | ICD-10-CM | POA: Diagnosis not present

## 2017-11-16 DIAGNOSIS — Z885 Allergy status to narcotic agent status: Secondary | ICD-10-CM | POA: Diagnosis not present

## 2017-11-16 DIAGNOSIS — I480 Paroxysmal atrial fibrillation: Secondary | ICD-10-CM | POA: Diagnosis present

## 2017-11-16 DIAGNOSIS — I5032 Chronic diastolic (congestive) heart failure: Secondary | ICD-10-CM | POA: Diagnosis not present

## 2017-11-16 DIAGNOSIS — R001 Bradycardia, unspecified: Secondary | ICD-10-CM | POA: Diagnosis not present

## 2017-11-16 DIAGNOSIS — I10 Essential (primary) hypertension: Secondary | ICD-10-CM | POA: Diagnosis not present

## 2017-11-16 DIAGNOSIS — R072 Precordial pain: Secondary | ICD-10-CM

## 2017-11-16 DIAGNOSIS — Z87891 Personal history of nicotine dependence: Secondary | ICD-10-CM

## 2017-11-16 DIAGNOSIS — Z7901 Long term (current) use of anticoagulants: Secondary | ICD-10-CM | POA: Diagnosis not present

## 2017-11-16 DIAGNOSIS — I472 Ventricular tachycardia: Secondary | ICD-10-CM | POA: Diagnosis not present

## 2017-11-16 DIAGNOSIS — I4892 Unspecified atrial flutter: Secondary | ICD-10-CM | POA: Diagnosis not present

## 2017-11-16 DIAGNOSIS — I080 Rheumatic disorders of both mitral and aortic valves: Secondary | ICD-10-CM | POA: Diagnosis present

## 2017-11-16 DIAGNOSIS — Z90710 Acquired absence of both cervix and uterus: Secondary | ICD-10-CM

## 2017-11-16 DIAGNOSIS — R0902 Hypoxemia: Secondary | ICD-10-CM | POA: Diagnosis present

## 2017-11-16 DIAGNOSIS — Z91048 Other nonmedicinal substance allergy status: Secondary | ICD-10-CM

## 2017-11-16 DIAGNOSIS — I110 Hypertensive heart disease with heart failure: Principal | ICD-10-CM | POA: Diagnosis present

## 2017-11-16 DIAGNOSIS — Z7982 Long term (current) use of aspirin: Secondary | ICD-10-CM | POA: Diagnosis not present

## 2017-11-16 DIAGNOSIS — I340 Nonrheumatic mitral (valve) insufficiency: Secondary | ICD-10-CM | POA: Diagnosis not present

## 2017-11-16 DIAGNOSIS — Z7951 Long term (current) use of inhaled steroids: Secondary | ICD-10-CM | POA: Diagnosis not present

## 2017-11-16 DIAGNOSIS — I481 Persistent atrial fibrillation: Secondary | ICD-10-CM | POA: Diagnosis not present

## 2017-11-16 DIAGNOSIS — Z79899 Other long term (current) drug therapy: Secondary | ICD-10-CM

## 2017-11-16 DIAGNOSIS — Z888 Allergy status to other drugs, medicaments and biological substances status: Secondary | ICD-10-CM

## 2017-11-16 HISTORY — DX: Unspecified atrial fibrillation: I48.91

## 2017-11-16 LAB — BASIC METABOLIC PANEL
Anion gap: 10 (ref 5–15)
BUN: 38 mg/dL — ABNORMAL HIGH (ref 6–20)
CO2: 25 mmol/L (ref 22–32)
Calcium: 9.6 mg/dL (ref 8.9–10.3)
Chloride: 102 mmol/L (ref 101–111)
Creatinine, Ser: 1.21 mg/dL — ABNORMAL HIGH (ref 0.44–1.00)
GFR calc Af Amer: 48 mL/min — ABNORMAL LOW (ref >60)
GFR calc non Af Amer: 42 mL/min — ABNORMAL LOW (ref >60)
Glucose, Bld: 134 mg/dL — ABNORMAL HIGH (ref 65–99)
Potassium: 4 mmol/L (ref 3.5–5.1)
Sodium: 137 mmol/L (ref 135–145)

## 2017-11-16 LAB — CBC
HCT: 37.4 % (ref 36.0–46.0)
Hemoglobin: 12.2 g/dL (ref 12.0–15.0)
MCH: 28.9 pg (ref 26.0–34.0)
MCHC: 32.6 g/dL (ref 30.0–36.0)
MCV: 88.6 fL (ref 78.0–100.0)
Platelets: 180 10*3/uL (ref 150–400)
RBC: 4.22 MIL/uL (ref 3.87–5.11)
RDW: 13.4 % (ref 11.5–15.5)
WBC: 11 10*3/uL — ABNORMAL HIGH (ref 4.0–10.5)

## 2017-11-16 LAB — I-STAT TROPONIN, ED: Troponin i, poc: 0.06 ng/mL (ref 0.00–0.08)

## 2017-11-16 LAB — TROPONIN I: Troponin I: 0.08 ng/mL (ref <0.03)

## 2017-11-16 LAB — POC OCCULT BLOOD, ED: Fecal Occult Bld: POSITIVE — AB

## 2017-11-16 LAB — BRAIN NATRIURETIC PEPTIDE: B Natriuretic Peptide: 1171.1 pg/mL — ABNORMAL HIGH (ref 0.0–100.0)

## 2017-11-16 MED ORDER — APIXABAN 5 MG PO TABS
5.0000 mg | ORAL_TABLET | Freq: Two times a day (BID) | ORAL | Status: DC
  Administered 2017-11-17 – 2017-11-24 (×16): 5 mg via ORAL
  Filled 2017-11-16 (×17): qty 1

## 2017-11-16 MED ORDER — ONDANSETRON HCL 4 MG/2ML IJ SOLN: 4.0000 mg | Freq: Four times a day (QID) | INTRAMUSCULAR | Status: DC | PRN

## 2017-11-16 MED ORDER — ROSUVASTATIN CALCIUM 10 MG PO TABS
10.0000 mg | ORAL_TABLET | Freq: Every day | ORAL | Status: DC
  Administered 2017-11-17 – 2017-11-23 (×8): 10 mg via ORAL
  Filled 2017-11-16 (×8): qty 1

## 2017-11-16 MED ORDER — FUROSEMIDE 10 MG/ML IJ SOLN
40.0000 mg | Freq: Once | INTRAMUSCULAR | Status: AC
  Administered 2017-11-16: 40 mg via INTRAVENOUS
  Filled 2017-11-16: qty 4

## 2017-11-16 MED ORDER — ALUM & MAG HYDROXIDE-SIMETH 200-200-20 MG/5ML PO SUSP
30.0000 mL | Freq: Once | ORAL | Status: AC | PRN
  Administered 2017-11-17: 30 mL via ORAL
  Filled 2017-11-16: qty 30

## 2017-11-16 MED ORDER — SODIUM CHLORIDE 0.9% FLUSH: 3.0000 mL | INTRAVENOUS | Status: DC | PRN

## 2017-11-16 MED ORDER — ALUM & MAG HYDROXIDE-SIMETH 200-200-20 MG/5ML PO SUSP: 30.0000 mL | Freq: Once | ORAL | Status: DC

## 2017-11-16 MED ORDER — VITAMIN D 1000 UNITS PO TABS
1000.0000 [IU] | ORAL_TABLET | Freq: Every day | ORAL | Status: DC
  Administered 2017-11-17 – 2017-11-24 (×8): 1000 [IU] via ORAL
  Filled 2017-11-16 (×8): qty 1

## 2017-11-16 MED ORDER — ACETAMINOPHEN 325 MG PO TABS
650.0000 mg | ORAL_TABLET | ORAL | Status: DC | PRN
  Administered 2017-11-17 – 2017-11-18 (×2): 650 mg via ORAL
  Filled 2017-11-16 (×2): qty 2

## 2017-11-16 MED ORDER — SODIUM CHLORIDE 0.9% FLUSH
3.0000 mL | Freq: Two times a day (BID) | INTRAVENOUS | Status: DC
  Administered 2017-11-17 – 2017-11-24 (×10): 3 mL via INTRAVENOUS

## 2017-11-16 MED ORDER — NITROGLYCERIN 2 % TD OINT
1.0000 [in_us] | TOPICAL_OINTMENT | Freq: Once | TRANSDERMAL | Status: AC
  Administered 2017-11-16: 1 [in_us] via TOPICAL
  Filled 2017-11-16: qty 1

## 2017-11-16 MED ORDER — SODIUM CHLORIDE 0.9 % IV SOLN: 250.0000 mL | INTRAVENOUS | Status: DC | PRN

## 2017-11-16 MED ORDER — FUROSEMIDE 10 MG/ML IJ SOLN
40.0000 mg | Freq: Once | INTRAMUSCULAR | Status: AC
  Administered 2017-11-17: 40 mg via INTRAVENOUS
  Filled 2017-11-16: qty 4

## 2017-11-16 MED ORDER — METOPROLOL SUCCINATE ER 25 MG PO TB24
25.0000 mg | ORAL_TABLET | Freq: Every day | ORAL | Status: DC
  Filled 2017-11-16: qty 1

## 2017-11-16 MED ORDER — ASPIRIN EC 81 MG PO TBEC
162.0000 mg | DELAYED_RELEASE_TABLET | Freq: Every day | ORAL | Status: DC
  Administered 2017-11-17 – 2017-11-18 (×2): 162 mg via ORAL
  Filled 2017-11-16 (×3): qty 2

## 2017-11-16 MED ORDER — AMLODIPINE BESYLATE 5 MG PO TABS
5.0000 mg | ORAL_TABLET | Freq: Every day | ORAL | Status: DC
  Administered 2017-11-17 – 2017-11-19 (×3): 5 mg via ORAL
  Filled 2017-11-16 (×3): qty 1

## 2017-11-16 NOTE — ED Triage Notes (Signed)
Pt to ED c/o increasing sob with exertion.  Was seen by Dr Claiborne Billings last week and dx with afib, and was placed on blood thinners metoprolol.  Since then she has become increasingly sob with exertion and c/o sternal chest pain.

## 2017-11-16 NOTE — ED Notes (Signed)
ED Provider at bedside. 

## 2017-11-16 NOTE — ED Notes (Signed)
Paged admitting per RN Roselyn Reef

## 2017-11-16 NOTE — ED Notes (Signed)
Patient transported to X-ray 

## 2017-11-16 NOTE — ED Provider Notes (Signed)
Blood pressure (!) 141/47, pulse (!) 49, temperature 98.2 F (36.8 C), temperature source Oral, resp. rate 19, height 5' 6.5" (1.689 m), weight 74.8 kg (165 lb), SpO2 92 %.  Assuming care from Dr. Winfred Leeds.  In short, Barbara [name] is a 77 y.o. female with a chief complaint of Chest Pain and Shortness of Breath .  Refer to the original H&P for additional details.  The current plan of care is to discuss with Cardiology regarding admission. Patient with heart failure symptoms and bradycardia/hypoxemia. Lasix and Nitro given.   04:55 PM Spoke to Dr. Aundra Dubin with Cardiology. Advises medicine admission and have the primary team consult cardiology tomorrow.   05:17 PM Discussed patient's case with Cardiology, Dr. Emilio Aspen to request admission. Patient and family (if present) updated with plan. Care transferred to Cardiology service.  I reviewed all nursing notes, vitals, pertinent old records, EKGs, labs, imaging (as available).  Nanda Quinton, MD    Margette Fast, MD 11/16/17 [phone]

## 2017-11-16 NOTE — ED Notes (Signed)
Pt given [place] sandwich, graham crackers, sprite, and water

## 2017-11-16 NOTE — H&P (Signed)
CARDIOLOGY H&P  HPI: Barbara [name] is a 77 y.o. female w/ history of HFpEF, HTN, HLD, CAD s/p LAD PCI in 2000 presenting with shortness of breath and palpitations.   Symptoms started approximately 1 week ago. She has had palpitations for a couple of weeks now and was ultimately diagnosed with new atrial fibrillation by her cardiologist Dr. Claiborne Billings. She was started on apixaban and metoprolol given that she had poorly controlled ventricular rates. She believes that the metoprolol helped her palpitations, but she has continued to have some palpitations and occasional rapid heart rates. Her last episode was earlier today.   Regarding her shortness of breath, she feels as though this has been slowly worsening lately. She cannot lay flat due to shortness of breath and has been using 3 pillows to sleep. She has not noticed any lower extremity edema and has not noticed any change in her weight. She denies having any episodes of chest pain, chest pressure, syncope, or presyncope. She has been taking all of her medications as prescribed. She has never been admitted to the hospital for heart failure. She denies any recent changes to her diet. Her SOB is worsened with exertion and improved with rest.   Review of Systems:     Cardiac Review of Systems: {Y] = yes [ ]  = no  Chest Pain [    ]  Resting SOB [   ] Exertional SOB  [ X ]  Orthopnea [ X ]   Pedal Edema [   ]    Palpitations [ X ] Syncope  [  ]   Presyncope [   ]  General Review of Systems: [Y] = yes [  ]=no Constitional: recent weight change [  ]; anorexia [  ]; fatigue [  ]; nausea [  ]; night sweats [  ]; fever [  ]; or chills [  ];                                                                     Dental: poor dentition[  ];   Eye : blurred vision [  ]; diplopia [   ]; vision changes [  ];  Amaurosis fugax[  ]; Resp: cough [  ];  wheezing[  ];  hemoptysis[  ]; shortness of breath[  ]; paroxysmal nocturnal dyspnea[  ]; dyspnea on exertion[ X  ]; or orthopnea[ X ];  GI:  gallstones[  ], vomiting[  ];  dysphagia[  ]; melena[  ];  hematochezia [  ]; heartburn[  ];   GU: kidney stones [  ]; hematuria[  ];   dysuria [  ];  nocturia[  ];               Skin: rash [  ], swelling[  ];, hair loss[  ];  peripheral edema[  ];  or itching[  ]; Musculosketetal: myalgias[  ];  joint swelling[  ];  joint erythema[  ];  joint pain[  ];  back pain[  ];  Heme/Lymph: bruising[  ];  bleeding[  ];  anemia[  ];  Neuro: TIA[  ];  headaches[  ];  stroke[  ];  vertigo[  ];  seizures[  ];   paresthesias[  ];  difficulty walking[  ];  Psych:depression[  ]; anxiety[  ];  Endocrine: diabetes[  ];  thyroid dysfunction[  ];  Other:  Past Medical History:  Diagnosis Date  . A-fib ([place])   . Allergy   . CAD (coronary artery disease)    sees Dr. Shelva Majestic  cardiac stents - 2000  . Colon polyps   . Heart murmur   . History of stress test    show normal perfusion without scar or ischemia, post EF 68%  . Hx of echocardiogram    show an EF 55%-60% range with grade 1 diastolic dysfunction, she had mitral anular calcification with mild MR, moderate LA dilation and mild pulmonary hypertension with a PA estimated pressure of 72mm  . Hyperlipidemia   . Hypertension   . Osteoarthritis   . Pneumonia    hx of 2015   . PONV (postoperative nausea and vomiting)     Prior to Admission medications   Medication Sig Start Date End Date Taking? Authorizing Provider  amLODipine (NORVASC) 5 MG tablet Take 1 tablet (5 mg total) by mouth daily. 07/15/17 11/16/17 Yes Troy Sine, MD  apixaban (ELIQUIS) 5 MG TABS tablet Take 1 tablet (5 mg total) by mouth 2 (two) times daily. 11/10/17  Yes Troy Sine, MD  aspirin EC 81 MG tablet Take 162 mg by mouth daily.   Yes [provider]  Cholecalciferol (D3-1000) 1000 units capsule Take 1,000 Units by mouth daily.   Yes [provider]  desoximetasone (TOPICORT) 0.25 % cream APPLY 1 APPLICATION TOPICALLY 2  TIMES DAILY Patient taking differently: APPLY 1 APPLICATION TOPICALLY 2 TIMES daily as needed 08/05/17  Yes Laurey Morale, MD  dextromethorphan (DELSYM) 30 MG/5ML liquid Take 15 mg by mouth as needed for cough. Reported on 05/22/2016   Yes [provider]  diphenhydramine-acetaminophen (TYLENOL PM) 25-500 MG TABS Take 1 tablet by mouth at bedtime.   Yes [provider]  loratadine (CLARITIN) 10 MG tablet Take 10 mg by mouth daily as needed for allergies.    Yes [provider]  losartan-hydrochlorothiazide Konrad Penta) 100-25 MG tablet One tablet by mouth daily 06/13/17  Yes Troy Sine, MD  metoprolol tartrate (LOPRESSOR) 50 MG tablet Take 0.5 tablets (25 mg total) by mouth every morning AND 0.5 tablets (25 mg total) every evening. 11/10/17 11/05/18 Yes Troy Sine, MD  Multiple Vitamin (MULTIVITAMIN WITH MINERALS) TABS tablet Take 1 tablet by mouth daily.   Yes [provider]  Omega-3 Fatty Acids (FISH OIL) 1200 MG CPDR Take 1 capsule by mouth daily.   Yes [provider]  OVER THE COUNTER MEDICATION Apply 1 application topically as needed. Blue stop   Yes [provider]  rosuvastatin (CRESTOR) 10 MG tablet Take 1 tablet (10 mg total) by mouth daily. Patient taking differently: Take 10 mg by mouth at bedtime.  03/11/17  Yes Troy Sine, MD  triamcinolone (NASACORT) 55 MCG/ACT nasal inhaler Place 1 spray into both nostrils daily as needed (Allergies).  10/17/12  Yes Laurey Morale, MD  spironolactone (ALDACTONE) 25 MG tablet Take 1 tablet (25 mg total) by mouth 2 (two) times daily. Patient not taking: Reported on 11/16/2017 06/13/17   Troy Sine, MD     Allergies  Allergen Reactions  . Lidocaine Anaphylaxis  . Morphine Other (See Comments)    Body shuts down  . Tape Itching  . Procaine Hcl Rash    Rash. Anything with caine in it   .  Sulfonamide Derivatives Hives  . Vytorin [Ezetimibe-Simvastatin] Other (See Comments)    Pt unsure  of allergy     Social History   Socioeconomic History  . Marital status: Married    Spouse name: Not on file  . Number of children: Not on file  . Years of education: Not on file  . Highest education level: Not on file  Social Needs  . Financial resource strain: Not on file  . Food insecurity - worry: Not on file  . Food insecurity - inability: Not on file  . Transportation needs - medical: Not on file  . Transportation needs - non-medical: Not on file  Occupational History  . Not on file  Tobacco Use  . Smoking status: Former Smoker    Last attempt to quit: 11/04/1996    Years since quitting: 21.0  . Smokeless tobacco: Never Used  Substance and Sexual Activity  . Alcohol use: No    Alcohol/week: 0.0 oz  . Drug use: No  . Sexual activity: Not on file  Other Topics Concern  . Not on file  Social History Narrative  . Not on file    Family History  Problem Relation Age of Onset  . Cancer Maternal Grandmother   . Heart disease Maternal Grandfather     PHYSICAL EXAM: Vitals:   11/16/17 1730 11/16/17 1800  BP: (!) 149/53 (!) 164/59  Pulse: (!) 44 (!) 50  Resp: (!) 22 (!) 23  Temp:    SpO2: 93% 90%   General:  Well appearing. No respiratory difficulty HEENT: normal Neck: supple. JVP elevated to ~9 cm H2O. Carotids 2+ bilat; no bruits.  Cor: PMI nondisplaced. Bradycardic. 2/6 holosystolic murmur at the LLSB. Lungs: diffuse course crackles throughout the bilateral lung fields Abdomen: soft, nontender, nondistended. No hepatosplenomegaly. No bruits or masses. Good bowel sounds. Extremities: no cyanosis, clubbing, rash, edema Neuro: alert & oriented x 3, cranial nerves grossly intact. moves all 4 extremities w/o difficulty. Affect pleasant.  ECG: Sinus bradycardia, HR 49 bpm, normal axis, LVH w/ repolarization abnormalities  Results for orders placed or performed during the hospital encounter of 11/16/17 (from the past 24 hour(s))  Basic metabolic panel     Status:  Abnormal   Collection Time: 11/16/17  1:16 PM  Result Value Ref Range   Sodium 137 135 - 145 mmol/L   Potassium 4.0 3.5 - 5.1 mmol/L   Chloride 102 101 - 111 mmol/L   CO2 25 22 - 32 mmol/L   Glucose, Bld 134 (H) 65 - 99 mg/dL   BUN 38 (H) 6 - 20 mg/dL   Creatinine, Ser 1.21 (H) 0.44 - 1.00 mg/dL   Calcium 9.6 8.9 - 10.3 mg/dL   GFR calc non Af Amer 42 (L) >60 mL/min   GFR calc Af Amer 48 (L) >60 mL/min   Anion gap 10 5 - 15  CBC     Status: Abnormal   Collection Time: 11/16/17  1:16 PM  Result Value Ref Range   WBC 11.0 (H) 4.0 - 10.5 K/uL   RBC 4.22 3.87 - 5.11 MIL/uL   Hemoglobin 12.2 12.0 - 15.0 g/dL   HCT 37.4 36.0 - 46.0 %   MCV 88.6 78.0 - 100.0 fL   MCH 28.9 26.0 - 34.0 pg   MCHC 32.6 30.0 - 36.0 g/dL   RDW 13.4 11.5 - 15.5 %   Platelets 180 150 - 400 K/uL  Brain natriuretic peptide     Status: Abnormal   Collection Time:  11/16/17  1:16 PM  Result Value Ref Range   B Natriuretic Peptide 1,171.1 (H) 0.0 - 100.0 pg/mL  I-stat troponin, ED     Status: None   Collection Time: 11/16/17  1:22 PM  Result Value Ref Range   Troponin i, poc 0.06 0.00 - 0.08 ng/mL   Comment 3          POC occult blood, ED Provider will collect     Status: Abnormal   Collection Time: 11/16/17  2:12 PM  Result Value Ref Range   Fecal Occult Bld POSITIVE (A) NEGATIVE   Dg Chest 2 View  Result Date: 11/16/2017 CLINICAL DATA:  Short of breath with exertion EXAM: CHEST  2 VIEW COMPARISON:  10/22/2013 FINDINGS: Vascular congestion is present. There are no Kerley B lines to suggest edema. There are patchy opacities at the left lung base. Mild cardiomegaly. No pneumothorax. No pleural effusion. IMPRESSION: Cardiomegaly and vascular congestion. Patchy consolidation at the left base. Followup PA and lateral chest X-ray is recommended in 3-4 weeks following trial of antibiotic therapy to ensure resolution and exclude underlying malignancy. Electronically Signed   By: Marybelle Killings M.D.   On: 11/16/2017 13:55    ASSESSMENT: Barbara [name] is a 77 y.o. female w/ history of HFpEF, HTN, HLD, CAD s/p LAD PCI in 2000 presenting with shortness of breath and palpitations, found to be volume overloaded and bradycardic.   Regarding the patient's volume overload, this dose not seem to be due to medication or dietary issues. It is possible that her bradycardia could have provoked some degree of volume retention. Alternatively her HFpEF may just be progressing to the point that she needs a daily diuretic to maintain euvolemia. She has no symptoms of ischemia and had a normal stress test in 2013. It is worth considering repeating a stress test to confirm that ischemia is not a driving factor for her presentation. Furthermore she has evidence of mild pulmonary hypertension on a prior echo as well as mild valvular disease (mild AS, mild MR), thus progression of her valvular heart disease may also be playing a role in her presentation.   Regarding the patient's bradycardia, this is almost certainly due to the recent increase in her beta blocker dose. She is in sinus rhythm at the time of presentation.   PLAN/DISCUSSION: - echo in AM - check hemoglobin a1c - IV lasix BID for goal of net -1L in first 24 hours - plan for repeat stress testing at some point in the future, could be done as an outpatient as long as echo does not reveal new systolic dysfunction - change beta blocker to metoprolol succinate 25mg  once daily - cont apixaban, aspirin, crestor - hold home losartan/HCTZ given mild AKI - plan to start spironolactone eventually during hospitalization, unclear why patient stopped this  Marcie Mowers, MD Cardiology Fellow, PGY-5

## 2017-11-16 NOTE — ED Provider Notes (Addendum)
[facility] DEPART[facility] Provider Note   CSN: [id number] Arrival date & time: 11/16/17  1306     History   Chief Complaint Chief Complaint  Patient presents with  . Chest Pain  . Shortness of Breath    HPI Barbara [name] is a 77 y.o. female.  HPI patient complains of shortness of breath worse with lying supine and worse with exertion onset 5-6 days ago.  She also complains of tightness anterior nonradiating in her chest which is worse with lying supine nonexertional for the past 5 or 6 days.  She is been sleeping on 3 pillows.  Sitting upright improves her breathing.  She was seen by Dr. Claiborne Billings in the office on 11/10/2017 started on Eliquis and metoprolol.  Her dyspnea continues to worsen.  Other associated symptoms include cough productive of clear sputum and chills denies fever.  No nausea or vomiting.  No other associated symptoms  Past Medical History:  Diagnosis Date  . A-fib ([place])   . Allergy   . CAD (coronary artery disease)    sees Dr. Shelva Majestic  cardiac stents - 2000  . Colon polyps   . Heart murmur   . History of stress test    show normal perfusion without scar or ischemia, post EF 68%  . Hx of echocardiogram    show an EF 55%-60% range with grade 1 diastolic dysfunction, she had mitral anular calcification with mild MR, moderate LA dilation and mild pulmonary hypertension with a PA estimated pressure of 35mm  . Hyperlipidemia   . Hypertension   . Osteoarthritis   . Pneumonia    hx of 2015   . PONV (postoperative nausea and vomiting)     Patient Active Problem List   Diagnosis Date Noted  . Gastroesophageal reflux disease 11/06/2017  . Bradycardia 12/27/2015  . Exertional dyspnea 12/23/2014  . Preoperative clearance 12/23/2014  . Hyperlipidemia LDL goal <70 09/13/2014  . Sinus bradycardia 09/13/2014  . LUMBAGO 05/03/2010  . VERTIGO 01/21/2009  . TEMPOROMANDIBULAR JOINT PAIN 08/31/2008  . Osteoarthritis 05/31/2008  .  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 HYPERLIPIDEMIA 04/06/2007  . Essential hypertension 04/06/2007  . Coronary atherosclerosis 04/06/2007  . ALLERGIC RHINITIS 04/06/2007  . Personal history of colonic polyps 04/06/2007    Past Surgical History:  Procedure Laterality Date  . ABDOMINAL HYSTERECTOMY  1971  . COLONOSCOPY  01-05-14   per Dr. Teena Irani, clear, no repeats needed   . CORONARY STENT PLACEMENT  2000   in LAD  . KNEE ARTHROSCOPY Left 01/06/2015   Procedure: LEFT KNEE ARTHROSCOPY, abrasion chondroplasty of the medial femerol condryl,medial and lateral menisectomy, microfracture , synovectomy of the suprpatellar pouch;  Surgeon: Latanya Maudlin, MD;  Location: [facility];  Service: Orthopedics;  Laterality: Left;    OB History    No data available       Home Medications    Prior to Admission medications   Medication Sig Start Date End Date Taking? Authorizing Provider  amLODipine (NORVASC) 5 MG tablet Take 1 tablet (5 mg total) by mouth daily. 07/15/17 10/13/17  Troy Sine, MD  apixaban (ELIQUIS) 5 MG TABS tablet Take 1 tablet (5 mg total) by mouth 2 (two) times daily. 11/10/17   Troy Sine, MD  aspirin EC 81 MG tablet Take 162 mg by mouth daily.    [provider]  Cholecalciferol (D3-1000) 1000 units capsule Take 1,000 Units by mouth daily.    [provider]  desoximetasone ([place])  0.25 % cream APPLY 1 APPLICATION TOPICALLY 2 TIMES DAILY 08/05/17   Laurey Morale, MD  dextromethorphan (DELSYM) 30 MG/5ML liquid Take 15 mg by mouth as needed for cough. Reported on 05/22/2016    [provider]  Diclofenac Sodium (PENNSAID) 1.5 % SOLN Place onto the skin.    [provider]  diphenhydramine-acetaminophen (TYLENOL PM) 25-500 MG TABS Take 1 tablet by mouth at bedtime.    [provider]  Glucos-Chond-Hyal Ac-Ca Fructo (MOVE FREE JOINT HEALTH ADVANCE PO) Take 1 tablet by mouth daily.    [provider]  loratadine (CLARITIN)  10 MG tablet Take 10 mg by mouth daily as needed for allergies.     [provider]  losartan-hydrochlorothiazide Konrad Penta) 100-25 MG tablet One tablet by mouth daily 06/13/17   Troy Sine, MD  Menthol, Topical Analgesic, (BIOFREEZE) 4 % GEL Apply topically.    [provider]  metoprolol tartrate (LOPRESSOR) 50 MG tablet Take 0.5 tablets (25 mg total) by mouth every morning AND 0.5 tablets (25 mg total) every evening. 11/10/17 11/05/18  Troy Sine, MD  Multiple Vitamin (MULTIVITAMIN WITH MINERALS) TABS tablet Take 1 tablet by mouth daily.    [provider]  Omega-3 Fatty Acids (FISH OIL) 1200 MG CPDR Take 1 capsule by mouth daily.    [provider]  Propylene Glycol-Glycerin (SOOTHE OP) Apply to eye.    [provider]  rosuvastatin (CRESTOR) 10 MG tablet Take 1 tablet (10 mg total) by mouth daily. 03/11/17   Troy Sine, MD  spironolactone (ALDACTONE) 25 MG tablet Take 1 tablet (25 mg total) by mouth 2 (two) times daily. 06/13/17   Troy Sine, MD  terbinafine (LAMISIL) 250 MG tablet Take 250 mg by mouth daily.    [provider]  triamcinolone (NASACORT) 55 MCG/ACT nasal inhaler Place 1 spray into both nostrils daily as needed (Allergies).  10/17/12   Laurey Morale, MD    Family History Family History  Problem Relation Age of Onset  . Cancer Maternal Grandmother   . Heart disease Maternal Grandfather     Social History Social History   Tobacco Use  . Smoking status: Former Smoker    Last attempt to quit: 11/04/1996    Years since quitting: 21.0  . Smokeless tobacco: Never Used  Substance Use Topics  . Alcohol use: No    Alcohol/week: 0.0 oz  . Drug use: No     Allergies   Lidocaine; Morphine; Tape; Procaine hcl; Sulfonamide derivatives; and Vytorin [ezetimibe-simvastatin]   Review of Systems Review of Systems  Constitutional: Negative.   HENT: Negative.   Respiratory: Positive for cough and shortness of  breath.   Cardiovascular: Positive for chest pain.  Gastrointestinal: Negative.   Musculoskeletal: Negative.   Skin: Negative.   Neurological: Negative.   Psychiatric/Behavioral: Negative.   All other systems reviewed and are negative.    Physical Exam Updated Vital Signs BP (!) 144/59 (BP Location: Right Arm)   Pulse (!) 48   Temp 98.2 F (36.8 C) (Oral)   Resp 20   Ht 5' 6.5" (1.689 m)   Wt 74.8 kg (165 lb)   SpO2 93%   BMI 26.23 kg/m   Physical Exam  Constitutional: She appears well-developed and well-nourished.  HENT:  Head: Normocephalic and atraumatic.  Eyes: Conjunctivae are normal. Pupils are equal, round, and reactive to light.  Neck: Neck supple. No tracheal deviation present. No thyromegaly present.  Cardiovascular: Regular rhythm.  No  murmur heard. bradycardic  Pulmonary/Chest: Effort normal. She has rales.  Rales at bases bilaterally no respiratory distress  Abdominal: Soft. Bowel sounds are normal. She exhibits no distension. There is no tenderness.  Genitourinary: Rectal exam shows guaiac positive stool.  Genitourinary Comments: Rectum normal tone , brown stool ,no gross blood  Musculoskeletal: Normal range of motion. She exhibits no edema or tenderness.  Neurological: She is alert. Coordination normal.  Skin: Skin is warm and dry. No rash noted.  Psychiatric: She has a normal mood and affect.  Nursing note and vitals reviewed.    ED Treatments / Results  Labs (all labs ordered are listed, but only abnormal results are displayed) Labs Reviewed  CBC - Abnormal; Notable for the following components:      Result Value   WBC 11.0 (*)    All other components within normal limits  BASIC METABOLIC PANEL  I-STAT TROPONIN, ED    EKG  EKG Interpretation  Date/Time:  Sunday November 16 2017 13:11:01 EST Ventricular Rate:  49 PR Interval:  180 QRS Duration: 82 QT Interval:  408 QTC Calculation: 368 R Axis:   85 Text Interpretation:  Sinus  bradycardia Left ventricular hypertrophy with repolarization abnormality Abnormal ECG No significant change since last tracing Confirmed by Orlie Dakin [phone]) on 11/16/2017 1:40:47 PM      Results for orders placed or performed during the hospital encounter of 60/45/40  Basic metabolic panel  Result Value Ref Range   Sodium 137 135 - 145 mmol/L   Potassium 4.0 3.5 - 5.1 mmol/L   Chloride 102 101 - 111 mmol/L   CO2 25 22 - 32 mmol/L   Glucose, Bld 134 (H) 65 - 99 mg/dL   BUN 38 (H) 6 - 20 mg/dL   Creatinine, Ser 1.21 (H) 0.44 - 1.00 mg/dL   Calcium 9.6 8.9 - 10.3 mg/dL   GFR calc non Af Amer 42 (L) >60 mL/min   GFR calc Af Amer 48 (L) >60 mL/min   Anion gap 10 5 - 15  CBC  Result Value Ref Range   WBC 11.0 (H) 4.0 - 10.5 K/uL   RBC 4.22 3.87 - 5.11 MIL/uL   Hemoglobin 12.2 12.0 - 15.0 g/dL   HCT 37.4 36.0 - 46.0 %   MCV 88.6 78.0 - 100.0 fL   MCH 28.9 26.0 - 34.0 pg   MCHC 32.6 30.0 - 36.0 g/dL   RDW 13.4 11.5 - 15.5 %   Platelets 180 150 - 400 K/uL  Brain natriuretic peptide  Result Value Ref Range   B Natriuretic Peptide 1,171.1 (H) 0.0 - 100.0 pg/mL  I-stat troponin, ED  Result Value Ref Range   Troponin i, poc 0.06 0.00 - 0.08 ng/mL   Comment 3          POC occult blood, ED Provider will collect  Result Value Ref Range   Fecal Occult Bld POSITIVE (A) NEGATIVE   Dg Chest 2 View  Result Date: 11/16/2017 CLINICAL DATA:  Short of breath with exertion EXAM: CHEST  2 VIEW COMPARISON:  10/22/2013 FINDINGS: Vascular congestion is present. There are no Kerley B lines to suggest edema. There are patchy opacities at the left lung base. Mild cardiomegaly. No pneumothorax. No pleural effusion. IMPRESSION: Cardiomegaly and vascular congestion. Patchy consolidation at the left base. Followup PA and lateral chest X-ray is recommended in 3-4 weeks following trial of antibiotic therapy to ensure resolution and exclude underlying malignancy. Electronically Signed   By: Rodena Goldmann.D.  On: 11/16/2017 13:55   Radiology No results found.  Procedures Procedures (including critical care time)  Medications Ordered in ED Medications - No data to display Results for orders placed or performed during the hospital encounter of 94/17/40  Basic metabolic panel  Result Value Ref Range   Sodium 137 135 - 145 mmol/L   Potassium 4.0 3.5 - 5.1 mmol/L   Chloride 102 101 - 111 mmol/L   CO2 25 22 - 32 mmol/L   Glucose, Bld 134 (H) 65 - 99 mg/dL   BUN 38 (H) 6 - 20 mg/dL   Creatinine, Ser 1.21 (H) 0.44 - 1.00 mg/dL   Calcium 9.6 8.9 - 10.3 mg/dL   GFR calc non Af Amer 42 (L) >60 mL/min   GFR calc Af Amer 48 (L) >60 mL/min   Anion gap 10 5 - 15  CBC  Result Value Ref Range   WBC 11.0 (H) 4.0 - 10.5 K/uL   RBC 4.22 3.87 - 5.11 MIL/uL   Hemoglobin 12.2 12.0 - 15.0 g/dL   HCT 37.4 36.0 - 46.0 %   MCV 88.6 78.0 - 100.0 fL   MCH 28.9 26.0 - 34.0 pg   MCHC 32.6 30.0 - 36.0 g/dL   RDW 13.4 11.5 - 15.5 %   Platelets 180 150 - 400 K/uL  Brain natriuretic peptide  Result Value Ref Range   B Natriuretic Peptide 1,171.1 (H) 0.0 - 100.0 pg/mL  I-stat troponin, ED  Result Value Ref Range   Troponin i, poc 0.06 0.00 - 0.08 ng/mL   Comment 3          POC occult blood, ED Provider will collect  Result Value Ref Range   Fecal Occult Bld POSITIVE (A) NEGATIVE   Dg Chest 2 View  Result Date: 11/16/2017 CLINICAL DATA:  Short of breath with exertion EXAM: CHEST  2 VIEW COMPARISON:  10/22/2013 FINDINGS: Vascular congestion is present. There are no Kerley B lines to suggest edema. There are patchy opacities at the left lung base. Mild cardiomegaly. No pneumothorax. No pleural effusion. IMPRESSION: Cardiomegaly and vascular congestion. Patchy consolidation at the left base. Followup PA and lateral chest X-ray is recommended in 3-4 weeks following trial of antibiotic therapy to ensure resolution and exclude underlying malignancy. Electronically Signed   By: Marybelle Killings M.D.   On: 11/16/2017  13:55     Initial Impression / Assessment and Plan / ED Course  I have reviewed the triage vital signs and the nursing notes.  Pertinent labs & imaging results that were available during my care of the patient were reviewed by me and considered in my medical decision making (see chart for details).     4:20 PM patient resting comfortably.   topical nitroglycerin and intravenous Lasix ordered by me.  Patient signed out to Dr. Laverta Baltimore.   Final Clinical Impressions(s) / ED Diagnoses  Dx#1 CHF #2  Hemoccult positive stools Final diagnoses:  None    ED Discharge Orders    None       Orlie Dakin, MD 11/16/17 [place], MD 12/01/17 1416

## 2017-11-17 ENCOUNTER — Inpatient Hospital Stay (HOSPITAL_COMMUNITY): Payer: Medicare Other

## 2017-11-17 ENCOUNTER — Ambulatory Visit: Payer: Medicare Other

## 2017-11-17 DIAGNOSIS — I509 Heart failure, unspecified: Secondary | ICD-10-CM

## 2017-11-17 DIAGNOSIS — I4892 Unspecified atrial flutter: Secondary | ICD-10-CM

## 2017-11-17 DIAGNOSIS — I340 Nonrheumatic mitral (valve) insufficiency: Secondary | ICD-10-CM

## 2017-11-17 LAB — MRSA PCR SCREENING: MRSA by PCR: NEGATIVE

## 2017-11-17 LAB — BASIC METABOLIC PANEL
Anion gap: 11 (ref 5–15)
BUN: 35 mg/dL — ABNORMAL HIGH (ref 6–20)
CO2: 24 mmol/L (ref 22–32)
Calcium: 9.2 mg/dL (ref 8.9–10.3)
Chloride: 102 mmol/L (ref 101–111)
Creatinine, Ser: 1.19 mg/dL — ABNORMAL HIGH (ref 0.44–1.00)
GFR calc Af Amer: 49 mL/min — ABNORMAL LOW (ref >60)
GFR calc non Af Amer: 43 mL/min — ABNORMAL LOW (ref >60)
Glucose, Bld: 124 mg/dL — ABNORMAL HIGH (ref 65–99)
Potassium: 3.2 mmol/L — ABNORMAL LOW (ref 3.5–5.1)
Sodium: 137 mmol/L (ref 135–145)

## 2017-11-17 LAB — ECHOCARDIOGRAM COMPLETE
Height: 65 in
Weight: 2640 oz

## 2017-11-17 MED ORDER — PSYLLIUM 95 % PO PACK
1.0000 | PACK | Freq: Every day | ORAL | Status: DC
  Administered 2017-11-21: 1 via ORAL
  Filled 2017-11-17 (×8): qty 1

## 2017-11-17 MED ORDER — MAGNESIUM HYDROXIDE 400 MG/5ML PO SUSP
30.0000 mL | Freq: Every day | ORAL | Status: DC | PRN
  Administered 2017-11-18: 30 mL via ORAL
  Filled 2017-11-17: qty 30

## 2017-11-17 MED ORDER — POTASSIUM CHLORIDE CRYS ER 20 MEQ PO TBCR
60.0000 meq | EXTENDED_RELEASE_TABLET | Freq: Once | ORAL | Status: AC
  Administered 2017-11-17: 60 meq via ORAL
  Filled 2017-11-17: qty 3

## 2017-11-17 MED ORDER — FUROSEMIDE 10 MG/ML IJ SOLN
40.0000 mg | Freq: Two times a day (BID) | INTRAMUSCULAR | Status: DC
  Administered 2017-11-17: 40 mg via INTRAVENOUS
  Filled 2017-11-17 (×2): qty 4

## 2017-11-17 NOTE — ED Triage Notes (Signed)
REport given by previous RN

## 2017-11-17 NOTE — Care Management Note (Signed)
Case Management Note  Patient Details  Name: Barbara [name] MRN: [id number] Date of Birth: [birth year]  Subjective/Objective:   From home,presents with CHF, PAF, AKI, postive fecal occult, bradycardia, has hx of htn, hld.  conts on lasix iv, on 4 liters.                 Action/Plan: NCM will follow for dc needs.   Expected Discharge Date:                  Expected Discharge Plan:     In-House Referral:     Discharge planning Services  CM Consult  Post Acute Care Choice:    Choice offered to:     DME Arranged:    DME Agency:     HH Arranged:    HH Agency:     Status of Service:  In process, will continue to follow  If discussed at Long Length of Stay Meetings, dates discussed:    Additional Comments:  Zenon Mayo, RN 11/17/2017, 10:19 AM

## 2017-11-17 NOTE — Progress Notes (Signed)
Progress Note  Patient Name: Barbara [name] Date of Encounter: 11/17/2017  Primary Cardiologist: Dr. Claiborne Billings  Subjective   Patient reports feeling weak this AM. States she is still having trouble catching her breath. Has been urinating frequently overnight. Denies CP, LE swelling, palpitations overnight.   Inpatient Medications    Scheduled Meds: . amLODipine  5 mg Oral Daily  . apixaban  5 mg Oral BID  . aspirin EC  162 mg Oral Daily  . cholecalciferol  1,000 Units Oral Daily  . metoprolol succinate  25 mg Oral Daily  . rosuvastatin  10 mg Oral QHS  . sodium chloride flush  3 mL Intravenous Q12H   Continuous Infusions: . sodium chloride     PRN Meds: sodium chloride, acetaminophen, ondansetron (ZOFRAN) IV, sodium chloride flush   Vital Signs    Vitals:   11/17/17 0537 11/17/17 0600 11/17/17 0700 11/17/17 0730  BP: (!) 140/51 (!) 143/48 (!) 159/50   Pulse: (!) 47 (!) 47 (!) 51 (!) 59  Resp: 16 18 20 20   Temp:      TempSrc:      SpO2: 93% 92% 93% 97%  Weight:      Height:        Intake/Output Summary (Last 24 hours) at 11/17/2017 0802 Last data filed at 11/17/2017 0530 Gross per 24 hour  Intake -  Output 400 ml  Net -400 ml   Filed Weights   11/16/17 1315  Weight: 165 lb (74.8 kg)    Telemetry    Sinus brady and atrial fibrillation - Personally Reviewed  ECG    Sinus brady with TWI in inferolateral leads (old) - Personally Reviewed  Physical Exam   GEN: Elderly female laying in bed in no acute distress.   Neck: No JVD, no carotid bruits Cardiac: bradycardic, regular rhythm, no murmurs, rubs, or gallops.  Respiratory: crackles at bases b/l; no wheezes or rhonchi; O2 via [place] at 4L GI: NABS, Soft, nontender, non-distended  MS: No edema; No deformity. Neuro:  Nonfocal, moving all extremities spontaneously Psych: Normal affect   Labs    Chemistry Recent Labs  Lab 11/16/17 1316 11/17/17 0319  NA 137 137  K 4.0 3.2*  CL 102 102  CO2 25  24  GLUCOSE 134* 124*  BUN 38* 35*  CREATININE 1.21* 1.19*  CALCIUM 9.6 9.2  GFRNONAA 42* 43*  GFRAA 48* 49*  ANIONGAP 10 11     Hematology Recent Labs  Lab 11/16/17 1316  WBC 11.0*  RBC 4.22  HGB 12.2  HCT 37.4  MCV 88.6  MCH 28.9  MCHC 32.6  RDW 13.4  PLT 180    Cardiac Enzymes Recent Labs  Lab 11/16/17 2141  TROPONINI 0.08*    Recent Labs  Lab 11/16/17 1322  TROPIPOC 0.06     BNP Recent Labs  Lab 11/16/17 1316  BNP 1,171.1*     DDimer No results for input(s): DDIMER in the last 168 hours.   Radiology    Dg Chest 2 View  Result Date: 11/16/2017 CLINICAL DATA:  Short of breath with exertion EXAM: CHEST  2 VIEW COMPARISON:  10/22/2013 FINDINGS: Vascular congestion is present. There are no Kerley B lines to suggest edema. There are patchy opacities at the left lung base. Mild cardiomegaly. No pneumothorax. No pleural effusion. IMPRESSION: Cardiomegaly and vascular congestion. Patchy consolidation at the left base. Followup PA and lateral chest X-ray is recommended in 3-4 weeks following trial of antibiotic therapy to ensure resolution and  exclude underlying malignancy. Electronically Signed   By: Marybelle Killings M.D.   On: 11/16/2017 13:55    Cardiac Studies   Echo pending  Patient Profile     77 y.o. female history of HFpEF, HTN, HLD, CAD s/p LAD PCI in 2000 presenting with shortness of breath and palpitations.   Assessment & Plan    Acute on Chronic Diastolic Heart Failure:  Patient with SOB progressively worsening over the past week with associated orthopnea. - BNP 1171 - CXR with vascular congestion - Started on IV lasix 40mg  BID - I&Os with -474mL output documented (no input); anticipate stricter I&Os once on the floor. Patient reports urinating a lot overnight.  - Continue daily weights - Echo pending   Chest pain with history of CAD s/p PCI to LAD in 2000: Patient reports an episode of chest pain on Saturday. Located in the epigastric region  and described as chest "tenderness" - EKG with TWI in inferolateral leads, seen on previous EKG - Trop uptrending to 0.08 - mild troponin elevation likely demand ischemia in setting of acute on chronic diastolic heart failure, however given episode of chest pain, would continue to trend to peak - Further work up based on echo results    - Continue ASA, statin  Bradycardia: possibly in the setting of metoprolol use vs sick sinus syndrome - Hold beta blocker for now - If bradycardia persists, may need to consider pacemaker  Paroxysmal atrial flutter: Presented to see Dr. Claiborne Billings outpatiet 11/10/17, EKG with atrial flutter rate 65. She noted feeling like her heart was racing over the past week. Dr. Claiborne Billings started her on eliquis and metoprolol tartrate 25mg  BID. Currently in sinus brady on EKG.  - Continue to hold beta blocker - Continue eliquis for now  HTN:  - Continue amlodipine  - HCTZ/losartan on hold for AKI  HLD: - Continue statin  AKI: baseline 0.8 - Hold HCTZ/Losartan  - Monitor Cr closely   Positive fecal occult: - Hbg stable  - Continue to monitor Hgb closely while on [facility] - Management per primary team   For questions or updates, please contact [place] Please consult [email or web address] for contact info under Cardiology/STEMI.      Signed, Abigail Butts, PA-C  11/17/2017, 8:02 AM   [phone]  Pt seen and examined  I agree with findings as noted by K Kroeger above   Pt remains fatigued   HR 50s   ON exam:  Lungs are now clear  Cardiac RRR  No S3  Ext without edema  1  Parox atrial flutter  Back in SR   Slow  Hold all negative chronotropes and follow Heart rate  Follow response   Continue anticoagulation   2  Acute diastolic CHF   PT diuresing  with IV lasix  3  Chest tightness  May be due to problems 1 and 2  Minimal bump in troponin  Currently symptom free   Echo pending   Dorris Carnes

## 2017-11-17 NOTE — Progress Notes (Signed)
  Echocardiogram 2D Echocardiogram has been performed.  Merrie Roof F 11/17/2017, 2:28 PM

## 2017-11-17 NOTE — Progress Notes (Signed)
Patient arrived to the unit from the ED. Patient is alert and oriented. Patient VS bp 154/49, HR 49, Resp 21 and O2 96. Patient denies any pain.

## 2017-11-17 NOTE — Discharge Instructions (Addendum)
You have an appointment set up with the Atrial Fibrillation Clinic.  Multiple studies have shown that being followed by a dedicated atrial fibrillation clinic in addition to the standard care you receive from your other physicians improves health. We believe that enrollment in the atrial fibrillation clinic will allow us to better care for you.  ° °The phone number to the Atrial Fibrillation Clinic is [phone]. The clinic is staffed Monday through Friday from 8:30am to 5pm. ° °Parking Directions: The clinic is located in the Heart and Vascular Building connected to  hospital. °1)From [address] turn on to [address] and go to the 3rd entrance  (Heart and Vascular entrance) on the right. °2)Look to the right for Heart &Vascular Parking Garage. °3)A code for the entrance is required please call the clinic to receive this.   °4)Take the elevators to the 1st floor. Registration is in the room with the glass walls at the end of the hallway. ° °If you have any trouble parking or locating the clinic, please don’t hesitate to call [phone]. °Information on my medicine - ELIQUIS® (apixaban) ° °Why was Eliquis® prescribed for you? °Eliquis® was prescribed for you to reduce the risk of a blood clot forming that can cause a stroke if you have a medical condition called atrial fibrillation (a type of irregular heartbeat). ° °What do You need to know about Eliquis® ? °Take your Eliquis® TWICE DAILY - one tablet in the morning and one tablet in the evening with or without food. If you have difficulty swallowing the tablet whole please discuss with your pharmacist how to take the medication safely. ° °Take Eliquis® exactly as prescribed by your doctor and DO NOT stop taking Eliquis® without talking to the doctor who prescribed the medication.  Stopping may increase your risk of developing a stroke.  Refill your prescription before you run out. ° °After discharge, you should have regular check-up  appointments with your healthcare provider that is prescribing your Eliquis®.  In the future your dose may need to be changed if your kidney function or weight changes by a significant amount or as you get older. ° °What do you do if you miss a dose? °If you miss a dose, take it as soon as you remember on the same day and resume taking twice daily.  Do not take more than one dose of ELIQUIS at the same time to make up a missed dose. ° °Important Safety Information °A possible side effect of Eliquis® is bleeding. You should call your healthcare provider right away if you experience any of the following: °? Bleeding from an injury or your nose that does not stop. °? Unusual colored urine (red or dark brown) or unusual colored stools (red or black). °? Unusual bruising for unknown reasons. °? A serious fall or if you hit your head (even if there is no bleeding). ° °Some medicines may interact with Eliquis® and might increase your risk of bleeding or clotting while on Eliquis®. To help avoid this, consult your healthcare provider or pharmacist prior to using any new prescription or non-prescription medications, including herbals, vitamins, non-steroidal anti-inflammatory drugs (NSAIDs) and supplements. ° °This website has more information on Eliquis® (apixaban): http://www.eliquis.com/eliquis/home ° °

## 2017-11-17 NOTE — ED Notes (Signed)
Heart healthy breakfast tray ordered @ [phone]

## 2017-11-18 ENCOUNTER — Other Ambulatory Visit: Payer: Self-pay

## 2017-11-18 LAB — BASIC METABOLIC PANEL
Anion gap: 12 (ref 5–15)
BUN: 35 mg/dL — ABNORMAL HIGH (ref 6–20)
CO2: 26 mmol/L (ref 22–32)
Calcium: 9.7 mg/dL (ref 8.9–10.3)
Chloride: 101 mmol/L (ref 101–111)
Creatinine, Ser: 1.05 mg/dL — ABNORMAL HIGH (ref 0.44–1.00)
GFR calc Af Amer: 57 mL/min — ABNORMAL LOW (ref >60)
GFR calc non Af Amer: 50 mL/min — ABNORMAL LOW (ref >60)
Glucose, Bld: 99 mg/dL (ref 65–99)
Potassium: 3.6 mmol/L (ref 3.5–5.1)
Sodium: 139 mmol/L (ref 135–145)

## 2017-11-18 MED ORDER — FUROSEMIDE 40 MG PO TABS
40.0000 mg | ORAL_TABLET | Freq: Every day | ORAL | Status: DC
  Administered 2017-11-20 – 2017-11-24 (×5): 40 mg via ORAL
  Filled 2017-11-18 (×6): qty 1

## 2017-11-18 MED ORDER — LORATADINE 10 MG PO TABS
10.0000 mg | ORAL_TABLET | Freq: Every day | ORAL | Status: DC
  Administered 2017-11-18 – 2017-11-24 (×7): 10 mg via ORAL
  Filled 2017-11-18 (×7): qty 1

## 2017-11-18 MED ORDER — SALINE SPRAY 0.65 % NA SOLN
1.0000 | NASAL | Status: DC | PRN
  Administered 2017-11-18 – 2017-11-20 (×2): 1 via NASAL
  Filled 2017-11-18 (×2): qty 44

## 2017-11-18 NOTE — Progress Notes (Signed)
Per MD order pt. walked around the unit several times, which is several hundred feet. HR maintained 80s-90. MD made aware.

## 2017-11-18 NOTE — Progress Notes (Addendum)
Progress Note  Patient Name: Barbara [name] Date of Encounter: 11/18/2017  Primary Cardiologist: Shelva Majestic, MD   Subjective   Patient feeling better overall. Still fatigued and with some SOB. C/o sinus congestion for which she takes claritin at home. Denies CP.   Inpatient Medications    Scheduled Meds: . amLODipine  5 mg Oral Daily  . apixaban  5 mg Oral BID  . aspirin EC  162 mg Oral Daily  . cholecalciferol  1,000 Units Oral Daily  . furosemide  40 mg Intravenous BID  . loratadine  10 mg Oral Daily  . psyllium  1 packet Oral Daily  . rosuvastatin  10 mg Oral QHS  . sodium chloride flush  3 mL Intravenous Q12H   Continuous Infusions: . sodium chloride     PRN Meds: sodium chloride, acetaminophen, magnesium hydroxide, ondansetron (ZOFRAN) IV, sodium chloride, sodium chloride flush   Vital Signs    Vitals:   11/18/17 0100 11/18/17 0350 11/18/17 0700 11/18/17 0726  BP: (!) 130/49 (!) 136/57 (!) 140/49   Pulse: (!) 51 (!) 58 (!) 54   Resp: 18 17 16    Temp:  98.1 F (36.7 C)  98 F (36.7 C)  TempSrc:  Oral  Oral  SpO2: 94% 94% 95%   Weight:  167 lb 1.7 oz (75.8 kg)    Height:        Intake/Output Summary (Last 24 hours) at 11/18/2017 0801 Last data filed at 11/18/2017 0500 Gross per 24 hour  Intake 1190 ml  Output 1650 ml  Net -460 ml   Filed Weights   11/16/17 1315 11/18/17 0350  Weight: 165 lb (74.8 kg) 167 lb 1.7 oz (75.8 kg)    Telemetry    Bradycardia, ?atrial fibrillation, one episode of NSVT - Personally Reviewed  ECG    Pending today - Personally Reviewed  Physical Exam   GEN: Well nourished, well developed female laying in bed in no acute distress.   Neck: No JVD, no carotid bruits Cardiac: bradycardic, regular rhythm, no murmurs, rubs, or gallops.  Respiratory: crackles at the bases b/l, no wheezes/ rhonchi GI: NABS, soft, nontender, non-distended  MS: No edema; No deformity. Neuro:  Nonfocal, moving all extremities  spontaneously Psych: Normal affect   Labs    Chemistry Recent Labs  Lab 11/16/17 1316 11/17/17 0319 11/18/17 0409  NA 137 137 139  K 4.0 3.2* 3.6  CL 102 102 101  CO2 25 24 26   GLUCOSE 134* 124* 99  BUN 38* 35* 35*  CREATININE 1.21* 1.19* 1.05*  CALCIUM 9.6 9.2 9.7  GFRNONAA 42* 43* 50*  GFRAA 48* 49* 57*  ANIONGAP 10 11 12      Hematology Recent Labs  Lab 11/16/17 1316  WBC 11.0*  RBC 4.22  HGB 12.2  HCT 37.4  MCV 88.6  MCH 28.9  MCHC 32.6  RDW 13.4  PLT 180    Cardiac Enzymes Recent Labs  Lab 11/16/17 2141  TROPONINI 0.08*    Recent Labs  Lab 11/16/17 1322  TROPIPOC 0.06     BNP Recent Labs  Lab 11/16/17 1316  BNP 1,171.1*     DDimer No results for input(s): DDIMER in the last 168 hours.   Radiology    Dg Chest 2 View  Result Date: 11/16/2017 CLINICAL DATA:  Short of breath with exertion EXAM: CHEST  2 VIEW COMPARISON:  10/22/2013 FINDINGS: Vascular congestion is present. There are no Kerley B lines to suggest edema. There are patchy opacities  at the left lung base. Mild cardiomegaly. No pneumothorax. No pleural effusion. IMPRESSION: Cardiomegaly and vascular congestion. Patchy consolidation at the left base. Followup PA and lateral chest X-ray is recommended in 3-4 weeks following trial of antibiotic therapy to ensure resolution and exclude underlying malignancy. Electronically Signed   By: Marybelle Killings M.D.   On: 11/16/2017 13:55    Cardiac Studies   ECHO 11/17/17: Study Conclusions  - Left ventricle: The cavity size was normal. Systolic function was   vigorous. The estimated ejection fraction was in the range of 70%   to 75%. There was dynamic obstruction at restin the outflow   tract, with a peak velocity of 289 cm/sec and a peak gradient of   33 mm Hg. Wall motion was normal; there were no regional wall   motion abnormalities. Features are consistent with a pseudonormal   left ventricular filling pattern, with concomitant abnormal    relaxation and increased filling pressure (grade 2 diastolic   dysfunction). - Aortic valve: Valve area (VTI): 2.07 cm^2. Valve area (Vmax):   2.08 cm^2. Valve area (Vmean): 2.3 cm^2. - Mitral valve: There was mild to moderate regurgitation directed   centrally. - Left atrium: The atrium was moderately to severely dilated. - Right atrium: The atrium was moderately dilated. - Pulmonary arteries: PA peak pressure: 53 mm Hg (S).  Impressions:  - Although the morphology is not consistent with hypertrophic   cardiomyopathy, there is a mildly increased subvalvular outflow   tract gradient due to hyperdynamic LV contraction and increased   stroke volume.  Patient Profile     77 y.o. female history of HFpEF, HTN, HLD, CAD s/p LAD PCI in 2000 presenting with shortness of breath and palpitations.   Assessment & Plan    1. Acute on Chronic Diastolic Heart Failure:  Patient with SOB progressively worsening over the past week with associated orthopnea. - BNP 1171 - CXR with vascular congestion - I&Os with net -850mL output this admission - Continue daily weights - Patient weaned off O2 via [place] yesterday - satting well on RA - Echo with EF 70-75%, no wall motion abnormalities, grade 2 diastolic dysfunction, and mildly increased subvalvular outflow tract gradient due to hyperdynamic LV contraction - With regards to mild LVOT obstruction, avoid vasodilators and dehydration - Would hold lasix today and resume as po 40mg  daily tomorrow  Chest pain with history of CAD s/p PCI to LAD in 2000: Patient reports an episode of chest pain on Saturday. Located in the epigastric region and described as chest "tenderness" - EKG with TWI in inferolateral leads, seen on previous EKG - Trop mildly elevated to 0.08 -  likely demand ischemia in setting of acute on chronic diastolic heart failure - Echo with stable EF and no wall motion abnormalities. Can consider outpatient NST once euvolemic    - Continue  ASA, statin  Bradycardia: possibly in the setting of metoprolol use vs sick sinus syndrome.  - Continue to hold beta blocker  - If bradycardia persists, may need to consider pacemaker  Paroxysmal atrial flutter: Presented to see Dr. Claiborne Billings outpatiet 11/10/17, EKG with atrial flutter rate 65. She noted feeling like her heart was racing over the past week. Dr. Claiborne Billings started her on eliquis and metoprolol tartrate 25mg  BID. Currently in sinus brady on EKG.  - Continue to hold beta blocker - Continue eliquis for now  HTN:  - Continue amlodipine  - HCTZ/losartan on hold for AKI  HLD: - Continue statin  AKI:  baseline 0.8 - Hold HCTZ/Losartan - can consider restarting tomorrow if Cr stable - Monitor Cr closely   Positive fecal occult: - Hbg stable  - Continue to monitor Hgb closely while on [facility] - Management per primary team    For questions or updates, please contact [place] Please consult [email or web address] for contact info under Cardiology/STEMI.      Signed, Abigail Butts, PA-C  11/18/2017, 8:01 AM   [phone]  Pt seen and examined  I agree with findings as ntoed by Teodoro Kil above   Pt feeling better today HR is higher   She did get SOB walking around nurses station  HR in 70s (SR) Lungs CTA  Cardiac RRR  II/VI systolic murmur   Ext without edema    I have discussed case/ reviewed EKG with A Sieler  Will set up for EP eval for possible flutter ablation Pt does not tolerate aflutter well nor bradycardia  Tx to tele (she says she is not ready to go home )  Ambulate  I am not convinced of any other active cardiac issues  No ischemia  Before last week she was feeling pretty good .  Dorris Carnes

## 2017-11-19 LAB — BASIC METABOLIC PANEL
Anion gap: 10 (ref 5–15)
Anion gap: 12 (ref 5–15)
BUN: 25 mg/dL — ABNORMAL HIGH (ref 6–20)
BUN: 25 mg/dL — ABNORMAL HIGH (ref 6–20)
CO2: 25 mmol/L (ref 22–32)
CO2: 24 mmol/L (ref 22–32)
Calcium: 9.5 mg/dL (ref 8.9–10.3)
Calcium: 9.5 mg/dL (ref 8.9–10.3)
Chloride: 104 mmol/L (ref 101–111)
Chloride: 103 mmol/L (ref 101–111)
Creatinine, Ser: 0.92 mg/dL (ref 0.44–1.00)
Creatinine, Ser: 1.03 mg/dL — ABNORMAL HIGH (ref 0.44–1.00)
GFR calc Af Amer: >60 mL/min (ref >60)
GFR calc Af Amer: 59 mL/min — ABNORMAL LOW (ref >60)
GFR calc non Af Amer: 58 mL/min — ABNORMAL LOW (ref >60)
GFR calc non Af Amer: 51 mL/min — ABNORMAL LOW (ref >60)
Glucose, Bld: 103 mg/dL — ABNORMAL HIGH (ref 65–99)
Glucose, Bld: 132 mg/dL — ABNORMAL HIGH (ref 65–99)
Potassium: 3.8 mmol/L (ref 3.5–5.1)
Potassium: 3.8 mmol/L (ref 3.5–5.1)
Sodium: 139 mmol/L (ref 135–145)
Sodium: 139 mmol/L (ref 135–145)

## 2017-11-19 LAB — CBC
HCT: 40.7 % (ref 36.0–46.0)
Hemoglobin: 13.5 g/dL (ref 12.0–15.0)
MCH: 29.6 pg (ref 26.0–34.0)
MCHC: 33.2 g/dL (ref 30.0–36.0)
MCV: 89.3 fL (ref 78.0–100.0)
Platelets: 271 10*3/uL (ref 150–400)
RBC: 4.56 MIL/uL (ref 3.87–5.11)
RDW: 13.7 % (ref 11.5–15.5)
WBC: 5.7 10*3/uL (ref 4.0–10.5)

## 2017-11-19 LAB — MAGNESIUM: Magnesium: 1.8 mg/dL (ref 1.7–2.4)

## 2017-11-19 LAB — TROPONIN I: Troponin I: 0.05 ng/mL (ref <0.03)

## 2017-11-19 MED ORDER — NITROGLYCERIN 0.4 MG SL SUBL
SUBLINGUAL_TABLET | SUBLINGUAL | Status: AC
  Administered 2017-11-19: 14:00:00
  Filled 2017-11-19: qty 1

## 2017-11-19 MED ORDER — DILTIAZEM HCL-DEXTROSE 100-5 MG/100ML-% IV SOLN (PREMIX)
5.0000 mg/h | INTRAVENOUS | Status: DC
  Administered 2017-11-19: 5 mg/h via INTRAVENOUS
  Administered 2017-11-20: 7.5 mg/h via INTRAVENOUS
  Administered 2017-11-20: 5 mg/h via INTRAVENOUS
  Filled 2017-11-19 (×3): qty 100

## 2017-11-19 MED ORDER — LOSARTAN POTASSIUM 50 MG PO TABS
50.0000 mg | ORAL_TABLET | Freq: Every day | ORAL | Status: DC
  Administered 2017-11-19 – 2017-11-24 (×6): 50 mg via ORAL
  Filled 2017-11-19 (×6): qty 1

## 2017-11-19 MED ORDER — DILTIAZEM LOAD VIA INFUSION
10.0000 mg | Freq: Once | INTRAVENOUS | Status: AC
  Administered 2017-11-19: 10 mg via INTRAVENOUS
  Filled 2017-11-19: qty 10

## 2017-11-19 MED ORDER — MAGNESIUM SULFATE IN D5W 1-5 GM/100ML-% IV SOLN
1.0000 g | Freq: Once | INTRAVENOUS | Status: AC
Start: 2017-11-19 — End: 2017-11-19
  Administered 2017-11-19: 1 g via INTRAVENOUS
  Filled 2017-11-19: qty 100

## 2017-11-19 MED ORDER — POTASSIUM CHLORIDE CRYS ER 20 MEQ PO TBCR
20.0000 meq | EXTENDED_RELEASE_TABLET | Freq: Every day | ORAL | Status: DC
  Administered 2017-11-19 – 2017-11-24 (×6): 20 meq via ORAL
  Filled 2017-11-19 (×6): qty 1

## 2017-11-19 MED ORDER — FUROSEMIDE 10 MG/ML IJ SOLN
40.0000 mg | Freq: Once | INTRAMUSCULAR | Status: AC
  Administered 2017-11-19: 40 mg via INTRAVENOUS
  Filled 2017-11-19: qty 4

## 2017-11-19 NOTE — Progress Notes (Signed)
Labs reviewed - troponin downtrending, Hgb stable. Came by to assess pt - HR better on diltiazem, breathing better, no pain reported, resting comfortably. HR 90s-low 100 which has been improving hourly. With h/o bradycardia would probably not aggressively push rate controlling medications further unless necessary/symptomatic. Will rx KCl 65meq daily (while on IV Lasix) and 1g Mag sulfate this evening given K 3.8 and Mg 1.8 as she may require consideration of Tikosyn this admission. F/u lytes in AM.  Melina Copa PA-C

## 2017-11-19 NOTE — Progress Notes (Signed)
Progress Note  Patient Name: Barbara [name] Date of Encounter: 11/19/2017  Primary Cardiologist: Shelva Majestic, MD   Subjective   Ambulated around the unit multiple times yesterday. Still with some DOE. SOB at rest is improved. Feeling nervous about going home and being able to perform ADLs. Denies CP or palpitations  Inpatient Medications    Scheduled Meds: . amLODipine  5 mg Oral Daily  . apixaban  5 mg Oral BID  . aspirin EC  162 mg Oral Daily  . cholecalciferol  1,000 Units Oral Daily  . furosemide  40 mg Oral Daily  . loratadine  10 mg Oral Daily  . psyllium  1 packet Oral Daily  . rosuvastatin  10 mg Oral QHS  . sodium chloride flush  3 mL Intravenous Q12H   Continuous Infusions: . sodium chloride     PRN Meds: sodium chloride, acetaminophen, magnesium hydroxide, ondansetron (ZOFRAN) IV, sodium chloride, sodium chloride flush   Vital Signs    Vitals:   11/18/17 2000 11/19/17 0045 11/19/17 0050 11/19/17 0453  BP: (!) 139/52 (!) 152/59 (!) 146/46 (!) 158/58  Pulse: (!) 59 (!) 55 (!) 52 (!) 58  Resp: 20  18 18   Temp:   98.8 F (37.1 C) 98.7 F (37.1 C)  TempSrc:   Oral Oral  SpO2: 96% 100% 98% 95%  Weight:   161 lb 2.5 oz (73.1 kg)   Height:   5\' 6"  (1.676 m)     Intake/Output Summary (Last 24 hours) at 11/19/2017 0937 Last data filed at 11/18/2017 2000 Gross per 24 hour  Intake 720 ml  Output -  Net 720 ml   Filed Weights   11/16/17 1315 11/18/17 0350 11/19/17 0050  Weight: 165 lb (74.8 kg) 167 lb 1.7 oz (75.8 kg) 161 lb 2.5 oz (73.1 kg)    Telemetry    Sinus brady/Sinus rhythm  - Personally Reviewed  ECG      Physical Exam   GEN: No acute distress.   Neck: No JVD, no carotid bruits Cardiac: RRR, no murmurs, rubs, or gallops.  Respiratory: crackles at bases b/l, no wheezes/rhonchi GI: NABS, Soft, nontender, non-distended  MS: No edema; No deformity. Neuro:  Nonfocal, moving all extremities spontaneously Psych: Normal affect    Labs    Chemistry Recent Labs  Lab 11/17/17 0319 11/18/17 0409 11/19/17 0606  NA 137 139 139  K 3.2* 3.6 3.8  CL 102 101 104  CO2 24 26 25   GLUCOSE 124* 99 103*  BUN 35* 35* 25*  CREATININE 1.19* 1.05* 0.92  CALCIUM 9.2 9.7 9.5  GFRNONAA 43* 50* 58*  GFRAA 49* 57* >60  ANIONGAP 11 12 10      Hematology Recent Labs  Lab 11/16/17 1316  WBC 11.0*  RBC 4.22  HGB 12.2  HCT 37.4  MCV 88.6  MCH 28.9  MCHC 32.6  RDW 13.4  PLT 180    Cardiac Enzymes Recent Labs  Lab 11/16/17 2141  TROPONINI 0.08*    Recent Labs  Lab 11/16/17 1322  TROPIPOC 0.06     BNP Recent Labs  Lab 11/16/17 1316  BNP 1,171.1*     DDimer No results for input(s): DDIMER in the last 168 hours.   Radiology    No results found.  Cardiac Studies    ECHO 11/17/17: Study Conclusions  - Left ventricle: The cavity size was normal. Systolic function was vigorous. The estimated ejection fraction was in the range of 70% to 75%. There was dynamic obstruction  at restin the outflow tract, with a peak velocity of 289 cm/sec and a peak gradient of 33 mm Hg. Wall motion was normal; there were no regional wall motion abnormalities. Features are consistent with a pseudonormal left ventricular filling pattern, with concomitant abnormal relaxation and increased filling pressure (grade 2 diastolic dysfunction). - Aortic valve: Valve area (VTI): 2.07 cm^2. Valve area (Vmax): 2.08 cm^2. Valve area (Vmean): 2.3 cm^2. - Mitral valve: There was mild to moderate regurgitation directed centrally. - Left atrium: The atrium was moderately to severely dilated. - Right atrium: The atrium was moderately dilated. - Pulmonary arteries: PA peak pressure: 53 mm Hg (S).  Impressions:  - Although the morphology is not consistent with hypertrophic cardiomyopathy, there is a mildly increased subvalvular outflow tract gradient due to hyperdynamic LV contraction and  increased stroke volume.    Patient Profile     77 y.o. female  77 y.o.femalehistory of HFpEF, HTN, HLD, CAD s/p LAD PCI in 2000 presenting with shortness of breath and palpitations.   Assessment & Plan    1. Acute on Chronic Diastolic Heart Failure: Patient with SOB progressively worsening over the past week with associated orthopnea.   - BNP 1171 - CXR with vascular congestion - I&Os with net -559mL output this admission - Continue daily weights - 165lb on admission > 161lb today - Echo with EF 70-75%, no wall motion abnormalities, grade 2 diastolic dysfunction, and mildly increased subvalvular outflow tract gradient due to hyperdynamic LV contraction - With regards to mild LVOT obstruction, avoid vasodilators and dehydration - Will give IV lasix 40mg  once  Today.  I am not convinced needs home lasix   Risk of dehydration   Chest pain with history of CAD s/p PCI to LAD in 2000: Although remote, pt does not remember CP prior to intervention in 2000 Patient reports an episode of chest pain on Saturday. Located in the epigastric region and described as chest "tenderness"  NOt clearly angina Troponin minimally elevated  In setting of CHF and LVH PT with some SOB  Volume mildly increased    Give lasix x 1  If SOB not improve then consider lexiscan myovue in AM   - Continue statin - Will discontinue ASA while on eliquis to minimize bleeding risk.  Bradycardia:Occurred after  b blocker initiated  Very sensitive to this  Low dose  Would avoid   Paroxysmal atrial flutter: Presented to see Dr. Claiborne Billings outpatiet 11/10/17, EKG with atrial flutter rate 65. She noted feeling like her heart was racing over the past week. Dr. Claiborne Billings started her on eliquis and metoprolol tartrate 25mg  BID. Currently in sinus brady on EKG.  - Continue eliquis for now - Patient has not demonstrated any atrial arrhythmias while in the hospital. Would follow  . If symptoms reoccur will need referral to EP  COuld  arrange for outpt event monitor if recurrent symtpoms   HTN:  BP elevated this AM 158/58 - Continue amlodipine  - Will restart Losartan at 50mg  daily now that Cr improved.    HLD: - Continue statin  XBL:TJQZESPQ 0.8 - Cr improved - Continue to monitor Cr closely   Positive fecal occult: - Hbg stable  - Continue to monitor Hgb closely while on [facility]  Anticipate patient will be ready for discharge tomorrow.     For questions or updates, please contact [place] Please consult [email or web address] for contact info under Cardiology/STEMI.      Signed, Abigail Butts, PA-C  11/19/2017, 9:37 AM  [phone]  Pt seen and examined  I have amended note above by K Kroeger above to reflect my findings   Pt still SOB with walking   On exam   Lungs with very mild rales  Cardiac RRR  No S3  II/VI systolic murmur   ABd benign  Ext without edema  Episode of atrial flutter triggered use of b blocker and then bradycardia ensued Pt got more SOB  Developed CHF  CHF in setting of hyperdynamic LV with some obstruction to outflow   Pt diruesed some   HR improved off of b blocker  Staying in SR   I am not convinced SOB represents anginal equiv (that would be a different new pproblem)  But if with gentle diuresis she does not get better would recomm lexiscan myovue to r/o inducible ischemia.  Dorris Carnes

## 2017-11-19 NOTE — Progress Notes (Signed)
Notified by RN that patient with HR in the 120s. Upon entering the room, patient laying in bed with mild tachypnea. She reports feeling fine until 2pm when she noticed her heart racing, SOB, and chest pressure.  VS: HR 120s, BP 116/68.  PE:  Cards: irregular rate/rhythm, TTP of L chest wall.  Pulm: CTA anteriorly  EKG: Afib RVR.   A/P: Patient with afib RVR. Concern for possible tachy-brady syndrome. Patient has been intolerant to beta blockers with symptomatic bradycardia.  - Will start diltiazem gtt - Norvasc discontinued - Will check troponin given CP - last trop 0.08 - not trended to peak. Possible this is ischemia driven? - Will check BMP - replete lytes if needed - Will recheck CBC - +FOB on admission - Discussed with EP who will review her chart to determine need for antiarrhythmic agent - Will monitor HR closely   Case reviewed with Dr. Ellyn Hack.

## 2017-11-20 DIAGNOSIS — I481 Persistent atrial fibrillation: Secondary | ICD-10-CM

## 2017-11-20 DIAGNOSIS — I5032 Chronic diastolic (congestive) heart failure: Secondary | ICD-10-CM

## 2017-11-20 DIAGNOSIS — I110 Hypertensive heart disease with heart failure: Principal | ICD-10-CM

## 2017-11-20 DIAGNOSIS — I5033 Acute on chronic diastolic (congestive) heart failure: Secondary | ICD-10-CM

## 2017-11-20 LAB — CBC WITH DIFFERENTIAL/PLATELET
Basophils Absolute: 0 10*3/uL (ref 0.0–0.1)
Basophils Relative: 0 %
Eosinophils Absolute: 0.3 10*3/uL (ref 0.0–0.7)
Eosinophils Relative: 5 %
HCT: 45.7 % (ref 36.0–46.0)
Hemoglobin: 14.9 g/dL (ref 12.0–15.0)
Lymphocytes Relative: 21 %
Lymphs Abs: 1.3 10*3/uL (ref 0.7–4.0)
MCH: 28.9 pg (ref 26.0–34.0)
MCHC: 32.6 g/dL (ref 30.0–36.0)
MCV: 88.6 fL (ref 78.0–100.0)
Monocytes Absolute: 0.6 10*3/uL (ref 0.1–1.0)
Monocytes Relative: 10 %
Neutro Abs: 4 10*3/uL (ref 1.7–7.7)
Neutrophils Relative %: 64 %
Platelets: 259 10*3/uL (ref 150–400)
RBC: 5.16 MIL/uL — ABNORMAL HIGH (ref 3.87–5.11)
RDW: 13.4 % (ref 11.5–15.5)
WBC: 6.2 10*3/uL (ref 4.0–10.5)

## 2017-11-20 LAB — BASIC METABOLIC PANEL
Anion gap: 13 (ref 5–15)
Anion gap: 14 (ref 5–15)
BUN: 16 mg/dL (ref 6–20)
BUN: 17 mg/dL (ref 6–20)
CO2: 22 mmol/L (ref 22–32)
CO2: 23 mmol/L (ref 22–32)
Calcium: 10.1 mg/dL (ref 8.9–10.3)
Calcium: 10.2 mg/dL (ref 8.9–10.3)
Chloride: 104 mmol/L (ref 101–111)
Chloride: 104 mmol/L (ref 101–111)
Creatinine, Ser: 0.93 mg/dL (ref 0.44–1.00)
Creatinine, Ser: 0.93 mg/dL (ref 0.44–1.00)
GFR calc Af Amer: >60 mL/min (ref >60)
GFR calc Af Amer: >60 mL/min (ref >60)
GFR calc non Af Amer: 57 mL/min — ABNORMAL LOW (ref >60)
GFR calc non Af Amer: 57 mL/min — ABNORMAL LOW (ref >60)
Glucose, Bld: 101 mg/dL — ABNORMAL HIGH (ref 65–99)
Glucose, Bld: 104 mg/dL — ABNORMAL HIGH (ref 65–99)
Potassium: 4.1 mmol/L (ref 3.5–5.1)
Potassium: 4.2 mmol/L (ref 3.5–5.1)
Sodium: 140 mmol/L (ref 135–145)
Sodium: 140 mmol/L (ref 135–145)

## 2017-11-20 LAB — MAGNESIUM: Magnesium: 2.1 mg/dL (ref 1.7–2.4)

## 2017-11-20 MED ORDER — SODIUM CHLORIDE 0.9% FLUSH
3.0000 mL | Freq: Two times a day (BID) | INTRAVENOUS | Status: DC
  Administered 2017-11-21 – 2017-11-24 (×5): 3 mL via INTRAVENOUS

## 2017-11-20 MED ORDER — DOFETILIDE 500 MCG PO CAPS
500.0000 ug | ORAL_CAPSULE | Freq: Two times a day (BID) | ORAL | Status: DC
  Administered 2017-11-20 – 2017-11-21 (×3): 500 ug via ORAL
  Filled 2017-11-20 (×3): qty 1

## 2017-11-20 MED ORDER — SODIUM CHLORIDE 0.9% FLUSH: 3.0000 mL | INTRAVENOUS | Status: DC | PRN

## 2017-11-20 MED ORDER — SODIUM CHLORIDE 0.9 % IV SOLN: 250.0000 mL | INTRAVENOUS | Status: DC | PRN

## 2017-11-20 NOTE — Progress Notes (Signed)
QTc= 460 per EKG on 11/20/2017 at 0907.  Chanetta Marshall, NP notified she stated ok to give first dose of Tikosyn now.

## 2017-11-20 NOTE — Care Management Important Message (Signed)
Important Message  Patient Details  Name: Barbara [name] MRN: [id number] Date of Birth: [birth year]   Medicare Important Message Given:  Yes    Orbie Pyo 11/20/2017, 3:06 PM

## 2017-11-20 NOTE — Consult Note (Signed)
ELECTROPHYSIOLOGY CONSULT NOTE    Patient ID: Barbara [name] MRN: [id number], DOB/AGE: 06-26-1941 77 y.o.  Admit date: 11/16/2017 Date of Consult: 11/20/2017  Primary Physician: Laurey Morale, MD Primary Cardiologist: Claiborne Billings  Patient Profile: Barbara [name] is a 77 y.o. female with a history of CAD, HTN who is being seen today for the evaluation of AF and sinus brady at the request of Dr Harrington Challenger.  HPI:  Barbara [name] is a 77 y.o. female with the above past medical history.  Around the first of the year she started having increased fatigue and shortness of breath with exertion. She was seen by Dr Claiborne Billings in the office on 1/7 and found to be in new onset atrial flutter that was rate controlled. She was started on Metoprolol at that time. She then developed dizziness, pre-syncope and worsening exercise tolerance and presented to the ER for evaluation.  She was found to be in sinus bradycardia. Her AVN blocking agents were held and then she developed recurrent AF with RVR.  EP has been asked to evaluate for treatment options.  Echo this admission demonstrated EF 70-75%, hyperdynamic LV, moderate MR, LA 47.    She currently feels better at rest and denies chest pain, palpitations, PND, orthopnea, nausea, vomiting, edema, weight gain, or early satiety.  Past Medical History:  Diagnosis Date  . A-fib ([place])   . Allergy   . CAD (coronary artery disease)    sees Dr. Shelva Majestic  cardiac stents - 2000  . Colon polyps   . Heart murmur   . History of stress test    show normal perfusion without scar or ischemia, post EF 68%  . Hx of echocardiogram    show an EF 55%-60% range with grade 1 diastolic dysfunction, she had mitral anular calcification with mild MR, moderate LA dilation and mild pulmonary hypertension with a PA estimated pressure of 17mm  . Hyperlipidemia   . Hypertension   . Osteoarthritis   . Pneumonia    hx of 2015   . PONV (postoperative nausea and  vomiting)      Surgical History:  Past Surgical History:  Procedure Laterality Date  . ABDOMINAL HYSTERECTOMY  1971  . COLONOSCOPY  01-05-14   per Dr. Teena Irani, clear, no repeats needed   . CORONARY STENT PLACEMENT  2000   in LAD  . KNEE ARTHROSCOPY Left 01/06/2015   Procedure: LEFT KNEE ARTHROSCOPY, abrasion chondroplasty of the medial femerol condryl,medial and lateral menisectomy, microfracture , synovectomy of the suprpatellar pouch;  Surgeon: Latanya Maudlin, MD;  Location: [facility];  Service: Orthopedics;  Laterality: Left;     Medications Prior to Admission  Medication Sig Dispense Refill Last Dose  . amLODipine (NORVASC) 5 MG tablet Take 1 tablet (5 mg total) by mouth daily. 90 tablet 1 11/16/2017 at Unknown time  . apixaban (ELIQUIS) 5 MG TABS tablet Take 1 tablet (5 mg total) by mouth 2 (two) times daily. 180 tablet 3 11/16/2017 at 0900  . aspirin EC 81 MG tablet Take 162 mg by mouth daily.   11/16/2017 at Unknown time  . Cholecalciferol (D3-1000) 1000 units capsule Take 1,000 Units by mouth daily.   11/16/2017 at Unknown time  . desoximetasone (TOPICORT) 0.25 % cream APPLY 1 APPLICATION TOPICALLY 2 TIMES DAILY (Patient taking differently: APPLY 1 APPLICATION TOPICALLY 2 TIMES daily as needed) 60 g 5 unknown at prn  . dextromethorphan (DELSYM) 30 MG/5ML liquid Take 15 mg by mouth as needed for cough.  Reported on 05/22/2016   11/15/2017 at prn  . diphenhydramine-acetaminophen (TYLENOL PM) 25-500 MG TABS Take 1 tablet by mouth at bedtime.   11/15/2017 at Unknown time  . loratadine (CLARITIN) 10 MG tablet Take 10 mg by mouth daily as needed for allergies.    11/14/2017 at prn  . losartan-hydrochlorothiazide (HYZAAR) 100-25 MG tablet One tablet by mouth daily 90 tablet 3 11/16/2017 at Unknown time  . metoprolol tartrate (LOPRESSOR) 50 MG tablet Take 0.5 tablets (25 mg total) by mouth every morning AND 0.5 tablets (25 mg total) every evening. 90 tablet 3 11/16/2017 at 0900  . Multiple Vitamin  (MULTIVITAMIN WITH MINERALS) TABS tablet Take 1 tablet by mouth daily.   11/16/2017 at Unknown time  . Omega-3 Fatty Acids (FISH OIL) 1200 MG CPDR Take 1 capsule by mouth daily.   11/16/2017 at Unknown time  . OVER THE COUNTER MEDICATION Apply 1 application topically as needed. Blue stop   Past Week at Unknown time  . rosuvastatin (CRESTOR) 10 MG tablet Take 1 tablet (10 mg total) by mouth daily. (Patient taking differently: Take 10 mg by mouth at bedtime. ) 90 tablet 3 11/15/2017 at Unknown time  . triamcinolone (NASACORT) 55 MCG/ACT nasal inhaler Place 1 spray into both nostrils daily as needed (Allergies).    11/13/2017 at prn  . spironolactone (ALDACTONE) 25 MG tablet Take 1 tablet (25 mg total) by mouth 2 (two) times daily. (Patient not taking: Reported on 11/16/2017) 180 tablet 3 Not Taking at Unknown time    Inpatient Medications:  . apixaban  5 mg Oral BID  . cholecalciferol  1,000 Units Oral Daily  . dofetilide  500 mcg Oral BID  . furosemide  40 mg Oral Daily  . loratadine  10 mg Oral Daily  . losartan  50 mg Oral Daily  . potassium chloride  20 mEq Oral Daily  . psyllium  1 packet Oral Daily  . rosuvastatin  10 mg Oral QHS  . sodium chloride flush  3 mL Intravenous Q12H  . sodium chloride flush  3 mL Intravenous Q12H    Allergies:  Allergies  Allergen Reactions  . Lidocaine Anaphylaxis  . Morphine Other (See Comments)    Body shuts down  . Tape Itching  . Procaine Hcl Rash    Rash. Anything with caine in it   . Sulfonamide Derivatives Hives  . Vytorin [Ezetimibe-Simvastatin] Other (See Comments)    Pt unsure of allergy     Social History   Socioeconomic History  . Marital status: Married    Spouse name: Not on file  . Number of children: Not on file  . Years of education: Not on file  . Highest education level: Not on file  Social Needs  . Financial resource strain: Not on file  . Food insecurity - worry: Not on file  . Food insecurity - inability: Not on file  .  Transportation needs - medical: Not on file  . Transportation needs - non-medical: Not on file  Occupational History  . Not on file  Tobacco Use  . Smoking status: Former Smoker    Last attempt to quit: 11/04/1996    Years since quitting: 21.0  . Smokeless tobacco: Never Used  Substance and Sexual Activity  . Alcohol use: No    Alcohol/week: 0.0 oz  . Drug use: No  . Sexual activity: Not on file  Other Topics Concern  . Not on file  Social History Narrative  . Not on file  Family History  Problem Relation Age of Onset  . Cancer Maternal Grandmother   . Heart disease Maternal Grandfather      Review of Systems: All other systems reviewed and are otherwise negative except as noted above.  Physical Exam: Vitals:   11/19/17 1414 11/19/17 1502 11/19/17 2114 11/20/17 0603  BP: 122/65 116/68 117/61 126/73  Pulse: 84 88 85 85  Resp:   18 20  Temp: 97.8 F (36.6 C)  98.6 F (37 C) 98.6 F (37 C)  TempSrc: Oral  Oral Oral  SpO2:  97% 96% 97%  Weight:    159 lb 4.8 oz (72.3 kg)  Height:        GEN- The patient is elderly appearing, alert and oriented x 3 today.   HEENT: normocephalic, atraumatic; sclera clear, conjunctiva pink; hearing intact; oropharynx clear; neck supple Lungs- Clear to ausculation bilaterally, normal work of breathing.  No wheezes, rales, rhonchi Heart- Irregular rate and rhythm  GI- soft, non-tender, non-distended, bowel sounds present Extremities- no clubbing, cyanosis, or edema  MS- no significant deformity or atrophy Skin- warm and dry, no rash or lesion Psych- euthymic mood, full affect Neuro- strength and sensation are intact  Labs:   Lab Results  Component Value Date   WBC 5.7 11/19/2017   HGB 13.5 11/19/2017   HCT 40.7 11/19/2017   MCV 89.3 11/19/2017   PLT 271 11/19/2017    Recent Labs  Lab 11/19/17 1608  NA 139  K 3.8  CL 103  CO2 24  BUN 25*  CREATININE 1.03*  CALCIUM 9.5  GLUCOSE 132*      Radiology/Studies: Dg  Chest 2 View  Result Date: 11/16/2017 CLINICAL DATA:  Short of breath with exertion EXAM: CHEST  2 VIEW COMPARISON:  10/22/2013 FINDINGS: Vascular congestion is present. There are no Kerley B lines to suggest edema. There are patchy opacities at the left lung base. Mild cardiomegaly. No pneumothorax. No pleural effusion. IMPRESSION: Cardiomegaly and vascular congestion. Patchy consolidation at the left base. Followup PA and lateral chest X-ray is recommended in 3-4 weeks following trial of antibiotic therapy to ensure resolution and exclude underlying malignancy. Electronically Signed   By: Marybelle Killings M.D.   On: 11/16/2017 13:55    EKG:AF (personally reviewed)  TELEMETRY: AF mostly rate controlled overnight  (personally reviewed)  Assessment/Plan: 1.  Persistent atrial fibrillation/flutter Symptomatic with exercise intolerance, shortness of breath, and dizziness She is not a candidate for 1C agents with CAD. She has sinus bradycardia and Sotalol may not be best option.  With LA enlargement, I suspect she has been in AF longer than she realizes. We discussed Tikosyn as an option today. She is willing to proceed Will start after labs back this morning (still not resulted) Continue Eliquis for [facility] of 4 Plan DCCV on Saturday if still in AF  2.  Sinus bradycardia In the setting of AVN blocking agents Will reassess once back in SR  3.  CAD No recent ischemic symptoms Continue medical therapy  4.  Acute on chronic diastolic heart failure Should improve with SR  5.  HTN Stable No change required today  Dr Rayann Heman to see later today    Signed, Chanetta Marshall, NP 11/20/2017 10:35 AM  I have seen, examined the patient, and reviewed the above assessment and plan.  Changes to above are made where necessary.  On exam, iRRR.  2/6 SEM LUSB.  Pt with severe LA enlargement and persistent afib.  I worry that our ability to  maintain sinus rhythm is low.  Will start tikosyn and follow  closely.  Convert diltiazem to oral tomorrow.  May require cardioversion on Saturday if does not convert to sinus in the interim.  Co Sign: Thompson Grayer, MD 11/20/2017 5:37 PM

## 2017-11-20 NOTE — Progress Notes (Addendum)
Progress Note  Patient Name: Barbara [name] Date of Encounter: 11/20/2017  Primary Cardiologist: Shelva Majestic, MD   Subjective   Patient feeling improved since yesterday. SOB and CP have resolved. Still with some palpitations.   Inpatient Medications    Scheduled Meds: . apixaban  5 mg Oral BID  . cholecalciferol  1,000 Units Oral Daily  . furosemide  40 mg Oral Daily  . loratadine  10 mg Oral Daily  . losartan  50 mg Oral Daily  . potassium chloride  20 mEq Oral Daily  . psyllium  1 packet Oral Daily  . rosuvastatin  10 mg Oral QHS  . sodium chloride flush  3 mL Intravenous Q12H   Continuous Infusions: . sodium chloride    . diltiazem (CARDIZEM) infusion 5 mg/hr (11/20/17 0137)   PRN Meds: sodium chloride, acetaminophen, magnesium hydroxide, ondansetron (ZOFRAN) IV, sodium chloride, sodium chloride flush   Vital Signs    Vitals:   11/19/17 1414 11/19/17 1502 11/19/17 2114 11/20/17 0603  BP: 122/65 116/68 117/61 126/73  Pulse: 84 88 85 85  Resp:   18 20  Temp: 97.8 F (36.6 C)  98.6 F (37 C) 98.6 F (37 C)  TempSrc: Oral  Oral Oral  SpO2:  97% 96% 97%  Weight:    159 lb 4.8 oz (72.3 kg)  Height:        Intake/Output Summary (Last 24 hours) at 11/20/2017 0801 Last data filed at 11/20/2017 0611 Gross per 24 hour  Intake 335.21 ml  Output 600 ml  Net -264.79 ml   Filed Weights   11/18/17 0350 11/19/17 0050 11/20/17 0603  Weight: 167 lb 1.7 oz (75.8 kg) 161 lb 2.5 oz (73.1 kg) 159 lb 4.8 oz (72.3 kg)    Telemetry    Currently afib with rate in 90s; Afib RVR 11/19/17 - Personally Reviewed  ECG    Afib RVR on 11/19/17. Await EKG this AM - Personally Reviewed  Physical Exam   GEN: awake and alert, laying comfortably in bed in no acute distress.   Neck: No JVD, no carotid bruits Cardiac: irregular rhythm, normal rate, no murmurs, rubs, or gallops.  Respiratory: Clear to auscultation bilaterally with trace crackles at the bases, no wheezes/  rhonchi GI: NABS, Soft, nontender, non-distended  MS: No edema; No deformity. Neuro:  Nonfocal, moving all extremities spontaneously Psych: Normal affect   Labs    Chemistry Recent Labs  Lab 11/18/17 0409 11/19/17 0606 11/19/17 1608  NA 139 139 139  K 3.6 3.8 3.8  CL 101 104 103  CO2 26 25 24   GLUCOSE 99 103* 132*  BUN 35* 25* 25*  CREATININE 1.05* 0.92 1.03*  CALCIUM 9.7 9.5 9.5  GFRNONAA 50* 58* 51*  GFRAA 57* >60 59*  ANIONGAP 12 10 12      Hematology Recent Labs  Lab 11/16/17 1316 11/19/17 1608  WBC 11.0* 5.7  RBC 4.22 4.56  HGB 12.2 13.5  HCT 37.4 40.7  MCV 88.6 89.3  MCH 28.9 29.6  MCHC 32.6 33.2  RDW 13.4 13.7  PLT 180 271    Cardiac Enzymes Recent Labs  Lab 11/16/17 2141 11/19/17 1608  TROPONINI 0.08* 0.05*    Recent Labs  Lab 11/16/17 1322  TROPIPOC 0.06     BNP Recent Labs  Lab 11/16/17 1316  BNP 1,171.1*     DDimer No results for input(s): DDIMER in the last 168 hours.   Radiology    No results found.  Cardiac Studies  ECHO 11/17/17: Study Conclusions  - Left ventricle: The cavity size was normal. Systolic function was vigorous. The estimated ejection fraction was in the range of 70% to 75%. There was dynamic obstruction at restin the outflow tract, with a peak velocity of 289 cm/sec and a peak gradient of 33 mm Hg. Wall motion was normal; there were no regional wall motion abnormalities. Features are consistent with a pseudonormal left ventricular filling pattern, with concomitant abnormal relaxation and increased filling pressure (grade 2 diastolic dysfunction). - Aortic valve: Valve area (VTI): 2.07 cm^2. Valve area (Vmax): 2.08 cm^2. Valve area (Vmean): 2.3 cm^2. - Mitral valve: There was mild to moderate regurgitation directed centrally. - Left atrium: The atrium was moderately to severely dilated. - Right atrium: The atrium was moderately dilated. - Pulmonary arteries: PA peak pressure: 53  mm Hg (S).  Impressions:  - Although the morphology is not consistent with hypertrophic cardiomyopathy, there is a mildly increased subvalvular outflow tract gradient due to hyperdynamic LV contraction and increased stroke volume.    Patient Profile     77 y.o.femalehistory of HFpEF, HTN, HLD, CAD s/p LAD PCI in 2000 presenting with shortness of breath and palpitations.   Assessment & Plan    1. Paroxysmal Atrial fib/flutter: Presented to see Dr. Claiborne Billings outpatiet 11/10/17, EKG with atrial flutter rate 65. She noted feeling like her heart was racing over the past week. Dr. Claiborne Billings started her on eliquis and metoprolol tartrate 25mg  BID. On presentation to [facility] ED, she was found to be sinus brady with HR in the 40s. Bradycardia improved, however on 11/19/17, patient found to be in Afib RVR.  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   2. Acute on Chronic Diastolic Heart Failure: Patient with SOB progressively worsening over the past week with associated orthopnea.   - BNP 1171 - CXR with vascular congestion - I&Os withnet-843mL outputthis admission - Continue daily weights - 165lb on admission > 159lb today - Echowith EF 70-75%, no wall motion abnormalities, grade 2 diastolic dysfunction, andmildly increased subvalvular outflow tract gradient due to hyperdynamic LV contraction - With regards to mild LVOT obstruction, avoid vasodilators and dehydration - s/p diuresis with IV lasix - will hold today.  Will do better if maintains SR    3. Chest pain with history of CAD s/p PCI to LAD in 2000: Although remote, pt does not remember CP prior to intervention in 2000. Patient reports an episode of chest pain on Saturday. Located in the epigastric region and described as chest  "tenderness"  Not clearly angina - Troponin minimally elevated (peaked at 0.08) in setting of CHF and LVH - Will defer ischemic work-up at this time. See how feels when rhythm restored   - Continue statin - Will discontinue ASA while on eliquis to minimize bleeding risk.  4. HTN: BP stable - Continue losartan  5. HLD: - Continue statin  6. AKI: baseline Cr 0.8 - Continue to monitor closely with diuresis  7. Positive fecal occult: possible patient with hemorrhoids? - Hgb stable - Continue to monitor closely while on [facility]    For questions or updates, please contact [place] Please consult [email or web address] for contact info under Cardiology/STEMI.      Signed, Abigail Butts, PA-C  11/20/2017, 8:01 AM   [phone]  Patient seen and examined  I agree with findings of K Kroeger above And amended this note  Pt currently in atrial fib    Rates 100s   Pt goes fast then slow when on b blockers ON exam:  Relatively comfortable  Lung CTA  Cardiac Irreg irreg  No S3  Ext without edema  Atrial fib   WIll ask EP to see Chronic Diastolic CHF  VOlume status not bad  Echo with vigorous LV function and gradient through LV  WIll do better in [place]

## 2017-11-20 NOTE — Progress Notes (Signed)
Pharmacy Review for Dofetilide (Tikosyn) Initiation  Admit Complaint: 77 y.o. female admitted 11/16/2017 with atrial fibrillation to be initiated on dofetilide.   Assessment:  Patient Exclusion Criteria: If any screening criteria checked as "Yes", then  patient  should NOT receive dofetilide until criteria item is corrected. If "Yes" please indicate correction plan.  YES  NO Patient  Exclusion Criteria Correction Plan  [x]  []  Baseline QTc interval is greater than or equal to 440 msec. IF above YES box checked dofetilide contraindicated unless patient has ICD; then may proceed if QTc 500-550 msec or with known ventricular conduction abnormalities may proceed with QTc 550-600 msec. QTc = 0.51 QTc 421 in sinus per A. Lynnell Jude, NP  []  [x]  Magnesium level is less than 1.8 mEq/l : Last magnesium:  Lab Results  Component Value Date   MG 1.8 11/19/2017       Mag 2.1 today  []  [x]  Potassium level is less than 4 mEq/l : Last potassium:  Lab Results  Component Value Date   K 3.8 11/19/2017       K+ 4.1 and 4.2 today  []  [x]  Patient is known or suspected to have a digoxin level greater than 2 ng/ml: No results found for: DIGOXIN    []  [x]  Creatinine clearance less than 20 ml/min (calculated using Cockcroft-Gault, actual body weight and serum creatinine): Estimated Creatinine Clearance: 45.8 mL/min (A) (by C-G formula based on SCr of 1.03 mg/dL (H)).    []  [x]  Patient has received drugs known to prolong the QT intervals within the last 48 hours (phenothiazines, tricyclics or tetracyclic antidepressants, erythromycin, H-1 antihistamines, cisapride, fluoroquinolones, azithromycin). Drugs not listed above may have an, as yet, undetected potential to prolong the QT interval, updated information on QT prolonging agents is available at this website:QT prolonging agents Last HCTZ 1/12. Was taking Tylenol PM qhs at home (contains Benadryl), last dose 1/12  []  [x]  Patient received a dose of  hydrochlorothiazide (Oretic) alone or in any combination including triamterene (Dyazide, Maxzide) in the last 48 hours. Was on Hyzaar at home, but Losartan alone here. Last HCTZ 1/13.  []  [x]  Patient received a medication known to increase dofetilide plasma concentrations prior to initial dofetilide dose:  . Trimethoprim (Primsol, Proloprim) in the last 36 hours . Verapamil (Calan, Verelan) in the last 36 hours or a sustained release dose in the last 72 hours . Megestrol (Megace) in the last 5 days  . Cimetidine (Tagamet) in the last 6 hours . Ketoconazole (Nizoral) in the last 24 hours . Itraconazole (Sporanox) in the last 48 hours  . Prochlorperazine (Compazine) in the last 36 hours    []  [x]  Patient is known to have a history of torsades de pointes; congenital or acquired long QT syndromes.   []  [x]  Patient has received a Class 1 antiarrhythmic with less than 2 half-lives since last dose. (Disopyramide, Quinidine, Procainamide, Lidocaine, Mexiletine, Flecainide, Propafenone)   []  [x]  Patient has received amiodarone therapy in the past 3 months or amiodarone level is greater than 0.3 ng/ml.    Patient has been appropriately anticoagulated with Eliquis.  Ordering provider was confirmed at [email or web address] if they are not listed on the [place] Prescribers list.  Goal of Therapy: Follow renal function, electrolytes, potential drug interactions, and dose adjustment. Provide education and 1 week supply at discharge.  Plan:  []   Physician selected initial dose within range recommended for patients level of renal function - will monitor for response.  [x]   Physician selected initial dose  outside of range recommended for patients level of renal function - will discuss if the dose should be altered at this time.   Select One Calculated CrCl  Dose q12h  [x]  > 60 ml/min 500 mcg  []  40-60 ml/min 250 mcg  []  20-40 ml/min 125 mcg   2. Follow up QTc after the first 5 doses, renal  function, electrolytes (K & Mg) daily x 3     days, dose adjustment, success of initiation and facilitate 1 week discharge supply as     clinically indicated.  3. Initiate Tikosyn education video (Call [phone] and ask for Tikosyn Video # 116).  4. Disussed with Genene Churn, NP.  Crcl <60 today, but Scr up from baseline and Dr. Rayann Heman would like to begin Tikosyn with 500 mcg PO q12h.   First dose now, 2nd dose 11pm tonight.  Arty Baumgartner, [place] Pager: [phone] or [phone]  11:02 AM 11/20/2017

## 2017-11-21 LAB — BASIC METABOLIC PANEL
Anion gap: 11 (ref 5–15)
BUN: 24 mg/dL — ABNORMAL HIGH (ref 6–20)
CO2: 23 mmol/L (ref 22–32)
Calcium: 9.7 mg/dL (ref 8.9–10.3)
Chloride: 104 mmol/L (ref 101–111)
Creatinine, Ser: 0.97 mg/dL (ref 0.44–1.00)
GFR calc Af Amer: >60 mL/min (ref >60)
GFR calc non Af Amer: 55 mL/min — ABNORMAL LOW (ref >60)
Glucose, Bld: 109 mg/dL — ABNORMAL HIGH (ref 65–99)
Potassium: 4 mmol/L (ref 3.5–5.1)
Sodium: 138 mmol/L (ref 135–145)

## 2017-11-21 LAB — MAGNESIUM: Magnesium: 1.9 mg/dL (ref 1.7–2.4)

## 2017-11-21 MED ORDER — DOFETILIDE 250 MCG PO CAPS
250.0000 ug | ORAL_CAPSULE | Freq: Two times a day (BID) | ORAL | Status: DC
  Administered 2017-11-21 – 2017-11-22 (×2): 250 ug via ORAL
  Filled 2017-11-21 (×2): qty 1

## 2017-11-21 NOTE — Progress Notes (Signed)
Progress Note  Patient Name: Barbara [name] Date of Encounter: 11/21/2017  Primary Cardiologist: Shelva Majestic, MD   Subjective   Patient feeling improved since yesterday. SOB and CP have resolved. Still with some palpitations.   Inpatient Medications    Scheduled Meds: . apixaban  5 mg Oral BID  . cholecalciferol  1,000 Units Oral Daily  . dofetilide  500 mcg Oral BID  . furosemide  40 mg Oral Daily  . loratadine  10 mg Oral Daily  . losartan  50 mg Oral Daily  . potassium chloride  20 mEq Oral Daily  . psyllium  1 packet Oral Daily  . rosuvastatin  10 mg Oral QHS  . sodium chloride flush  3 mL Intravenous Q12H  . sodium chloride flush  3 mL Intravenous Q12H   Continuous Infusions: . sodium chloride    . sodium chloride     PRN Meds: sodium chloride, sodium chloride, acetaminophen, magnesium hydroxide, ondansetron (ZOFRAN) IV, sodium chloride, sodium chloride flush, sodium chloride flush   Vital Signs    Vitals:   11/20/17 0603 11/20/17 1326 11/20/17 2147 11/21/17 0443  BP: 126/73 (!) 142/70 137/77 (!) 143/56  Pulse: 85 92 (!) 105 (!) 54  Resp: 20  20 18   Temp: 98.6 F (37 C) 98.1 F (36.7 C) 98 F (36.7 C) 97.6 F (36.4 C)  TempSrc: Oral Oral Oral Oral  SpO2: 97% 97% 98% 97%  Weight: 159 lb 4.8 oz (72.3 kg)   159 lb 6.3 oz (72.3 kg)  Height:        Intake/Output Summary (Last 24 hours) at 11/21/2017 0929 Last data filed at 11/21/2017 0526 Gross per 24 hour  Intake 961.58 ml  Output 750 ml  Net 211.58 ml   Filed Weights   11/19/17 0050 11/20/17 0603 11/21/17 0443  Weight: 161 lb 2.5 oz (73.1 kg) 159 lb 4.8 oz (72.3 kg) 159 lb 6.3 oz (72.3 kg)    Telemetry    Currently afib with rate in 90s; Afib RVR 11/19/17 - Personally Reviewed  ECG    Afib RVR on 11/19/17. Await EKG this AM - Personally Reviewed  Physical Exam   GEN: awake and alert, laying comfortably in bed in no acute distress.   Neck: No JVD, no carotid bruits Cardiac: irregular  rhythm, normal rate, no murmurs, rubs, or gallops.  Respiratory: Clear to auscultation bilaterally with trace crackles at the bases, no wheezes/ rhonchi GI: NABS, Soft, nontender, non-distended  MS: No edema; No deformity. Neuro:  Nonfocal, moving all extremities spontaneously Psych: Normal affect   Labs    Chemistry Recent Labs  Lab 11/19/17 1608 11/20/17 1044 11/21/17 0503  NA 139 140  140 138  K 3.8 4.2  4.1 4.0  CL 103 104  104 104  CO2 24 23  22 23   GLUCOSE 132* 101*  104* 109*  BUN 25* 17  16 24*  CREATININE 1.03* 0.93  0.93 0.97  CALCIUM 9.5 10.1  10.2 9.7  GFRNONAA 51* 57*  57* 55*  GFRAA 59* >60  >60 >60  ANIONGAP 12 13  14 11      Hematology Recent Labs  Lab 11/16/17 1316 11/19/17 1608 11/20/17 1044  WBC 11.0* 5.7 6.2  RBC 4.22 4.56 5.16*  HGB 12.2 13.5 14.9  HCT 37.4 40.7 45.7  MCV 88.6 89.3 88.6  MCH 28.9 29.6 28.9  MCHC 32.6 33.2 32.6  RDW 13.4 13.7 13.4  PLT 180 271 259    Cardiac Enzymes Recent  Labs  Lab 11/16/17 2141 11/19/17 1608  TROPONINI 0.08* 0.05*    Recent Labs  Lab 11/16/17 1322  TROPIPOC 0.06     BNP Recent Labs  Lab 11/16/17 1316  BNP 1,171.1*     DDimer No results for input(s): DDIMER in the last 168 hours.   Radiology    No results found.  Cardiac Studies    ECHO 11/17/17: Study Conclusions  - Left ventricle: The cavity size was normal. Systolic function was vigorous. The estimated ejection fraction was in the range of 70% to 75%. There was dynamic obstruction at restin the outflow tract, with a peak velocity of 289 cm/sec and a peak gradient of 33 mm Hg. Wall motion was normal; there were no regional wall motion abnormalities. Features are consistent with a pseudonormal left ventricular filling pattern, with concomitant abnormal relaxation and increased filling pressure (grade 2 diastolic dysfunction). - Aortic valve: Valve area (VTI): 2.07 cm^2. Valve area (Vmax): 2.08  cm^2. Valve area (Vmean): 2.3 cm^2. - Mitral valve: There was mild to moderate regurgitation directed centrally. - Left atrium: The atrium was moderately to severely dilated. - Right atrium: The atrium was moderately dilated. - Pulmonary arteries: PA peak pressure: 53 mm Hg (S).  Impressions:  - Although the morphology is not consistent with hypertrophic cardiomyopathy, there is a mildly increased subvalvular outflow tract gradient due to hyperdynamic LV contraction and increased stroke volume.    Patient Profile     77 y.o.femalehistory of HFpEF, HTN, HLD, CAD s/p LAD PCI in 2000 presenting with shortness of breath and palpitations.   Assessment & Plan    1. Paroxysmal Atrial fib/flutter: Presented to see Dr. Claiborne Billings outpatiet 11/10/17, EKG with atrial flutter rate 65. She noted feeling like her heart was racing over the past week. Dr. Claiborne Billings started her on eliquis and metoprolol tartrate 25mg  BID. On presentation to [facility] ED, she was found to be sinus brady with HR in the 40s. Bradycardia improved, however on 11/19/17, patient found to be in Afib RVR.  Now undergoing tikosyn load  Currently in SR  EP following  Continue eliquis.    2. Acute on Chronic Diastolic Heart Failure: Vascular congestison on admit    Duresed gently   - Echowith EF 70-75%, no wall motion abnormalities, grade 2 diastolic dysfunction, andmildly increased subvalvular outflow tract gradient due to hyperdynamic LV contraction - With regards to mild LVOT obstruction, avoid vasodilators and dehydration Volume status ok today   Appears to do better in [place]  3. Chest pain with history of CAD s/p PCI to LAD in 2000: Although remote, pt does not remember CP prior to intervention in 2000.  Aystmptomatic  Keep on same meds    4. HTN: BP stable BP is OK on current regimen   5. HLD: - Continue statin  7. Positive fecal occult: possible patient with hemorrhoids? - - Continue to monitor closely while on  [facility]    For questions or updates, please contact [place] Please consult [email or web address] for contact info under Cardiology/STEMI.      Signed, Dorris Carnes, MD  11/21/2017, 9:29 AM   [phone]

## 2017-11-21 NOTE — Progress Notes (Signed)
Corrected QTc 448ms. Patient ok to receive her dose of dofetilide 269mcg at 10:00pm. We will obtain an EKG 2 hrs post administration as well.   Fellow Willeen Cass

## 2017-11-21 NOTE — Progress Notes (Signed)
Progress Note   Subjective   Doing well today, the patient denies CP or SOB.  No new concerns.  She has converted to sinus rhythm.  Inpatient Medications    Scheduled Meds: . apixaban  5 mg Oral BID  . cholecalciferol  1,000 Units Oral Daily  . dofetilide  500 mcg Oral BID  . furosemide  40 mg Oral Daily  . loratadine  10 mg Oral Daily  . losartan  50 mg Oral Daily  . potassium chloride  20 mEq Oral Daily  . psyllium  1 packet Oral Daily  . rosuvastatin  10 mg Oral QHS  . sodium chloride flush  3 mL Intravenous Q12H  . sodium chloride flush  3 mL Intravenous Q12H   Continuous Infusions: . sodium chloride    . sodium chloride     PRN Meds: sodium chloride, sodium chloride, acetaminophen, magnesium hydroxide, ondansetron (ZOFRAN) IV, sodium chloride, sodium chloride flush, sodium chloride flush   Vital Signs    Vitals:   11/20/17 0603 11/20/17 1326 11/20/17 2147 11/21/17 0443  BP: 126/73 (!) 142/70 137/77 (!) 143/56  Pulse: 85 92 (!) 105 (!) 54  Resp: 20  20 18   Temp: 98.6 F (37 C) 98.1 F (36.7 C) 98 F (36.7 C) 97.6 F (36.4 C)  TempSrc: Oral Oral Oral Oral  SpO2: 97% 97% 98% 97%  Weight: 159 lb 4.8 oz (72.3 kg)   159 lb 6.3 oz (72.3 kg)  Height:        Intake/Output Summary (Last 24 hours) at 11/21/2017 0756 Last data filed at 11/21/2017 0526 Gross per 24 hour  Intake 1141.58 ml  Output 750 ml  Net 391.58 ml   Filed Weights   11/19/17 0050 11/20/17 0603 11/21/17 0443  Weight: 161 lb 2.5 oz (73.1 kg) 159 lb 4.8 oz (72.3 kg) 159 lb 6.3 oz (72.3 kg)    Telemetry    afib has converted to sinus rhythm - Personally Reviewed  Physical Exam   GEN- The patient is well appearing, alert and oriented x 3 today.   Head- normocephalic, atraumatic Eyes-  Sclera clear, conjunctiva pink Ears- hearing intact Oropharynx- clear Neck- supple, Lungs- Clear to ausculation bilaterally, normal work of breathing Heart- Regular rate and rhythm  GI- soft, NT, ND, +  BS Extremities- no clubbing, cyanosis, or edema  MS- no significant deformity or atrophy Skin- no rash or lesion Psych- euthymic mood, full affect Neuro- strength and sensation are intact   Labs    Chemistry Recent Labs  Lab 11/19/17 1608 11/20/17 1044 11/21/17 0503  NA 139 140  140 138  K 3.8 4.2  4.1 4.0  CL 103 104  104 104  CO2 24 23  22 23   GLUCOSE 132* 101*  104* 109*  BUN 25* 17  16 24*  CREATININE 1.03* 0.93  0.93 0.97  CALCIUM 9.5 10.1  10.2 9.7  GFRNONAA 51* 57*  57* 55*  GFRAA 59* >60  >60 >60  ANIONGAP 12 13  14 11      Hematology Recent Labs  Lab 11/16/17 1316 11/19/17 1608 11/20/17 1044  WBC 11.0* 5.7 6.2  RBC 4.22 4.56 5.16*  HGB 12.2 13.5 14.9  HCT 37.4 40.7 45.7  MCV 88.6 89.3 88.6  MCH 28.9 29.6 28.9  MCHC 32.6 33.2 32.6  RDW 13.4 13.7 13.4  PLT 180 271 259    Cardiac Enzymes Recent Labs  Lab 11/16/17 2141 11/19/17 1608  TROPONINI 0.08* 0.05*    Recent Labs  Lab 11/16/17 1322  TROPIPOC 0.06        Assessment & Plan    1.  Persistent atrial fibrillation/ atrial flutter Converted to sinus rhythm with tikosyn Qt is stable  2. Sinus bradycardia Controlled  3. Acute on chronic diastolic dysfunction Per general cardiology team  4. HTN Stable No change required today  Will remain in the hospital until Monday for tikosyn load.  Thompson Grayer MD, [facility] 11/21/2017 7:56 AM

## 2017-11-21 NOTE — Progress Notes (Signed)
Patient converted to SB. EKG in chart. Stopped cardizem drip since HR less than 60. Paged Cardiology to notify. Will continue to monitor.

## 2017-11-21 NOTE — Care Management Note (Signed)
Case Management Note  Patient Details  Name: Barbara [name] MRN: [id number] Date of Birth: [birth year]  Subjective/Objective: Pt presented for SOB, Palpitations and CP- Plan for Tikosyn Load. Pt will need a Rx for 14 day supply no refills and CM will assist with this. Pt will need original Rx sent to [facility] and cost will be $35.00. Pt is aware of cost. PTA independent and no home needs at this time.                     Action/Plan: Benefits Check Completed for Tikosyn:   S/W JENNY @ [place] # [phone]    1.TIKOSYN 125 MCG , 250 MCG , 500 MCG BID  COVER- NOT COVER  PRIOR APPROVAL- [phone]   2. DOFETILIDE 125 MCG BID  COVER- YES  CO-PAY- $ 112.26  PRIOR APPROVAL- NO   3. DOFETILIDE 250 MCG BID  COVER- YES  CO-PAY- $ 112.26  PRIOR APPROVAL- NO   4. DOFETILIDE  500 MCG BID  COVER- YES  CO-PAY- $ 112.26  PRIOR APPROVAL- NO   MAIL-ORDER FOR 90 DAY SUPPLY $ 35.00  PREFERRED PHARMACY : [facility]  Expected Discharge Date:  11/21/17               Expected Discharge Plan:  Home/Self Care  In-House Referral:  NA  Discharge planning Services  CM Consult, Medication Assistance  Post Acute Care Choice:  NA Choice offered to:  NA  DME Arranged:  N/A DME Agency:  NA  HH Arranged:  NA HH Agency:  NA  Status of Service:  Completed, signed off  If discussed at [place] of Stay Meetings, dates discussed:    Additional Comments:  Bethena Roys, RN 11/21/2017, 11:41 AM

## 2017-11-22 DIAGNOSIS — I10 Essential (primary) hypertension: Secondary | ICD-10-CM

## 2017-11-22 LAB — BASIC METABOLIC PANEL
Anion gap: 10 (ref 5–15)
BUN: 21 mg/dL — ABNORMAL HIGH (ref 6–20)
CO2: 24 mmol/L (ref 22–32)
Calcium: 9.6 mg/dL (ref 8.9–10.3)
Chloride: 102 mmol/L (ref 101–111)
Creatinine, Ser: 0.84 mg/dL (ref 0.44–1.00)
GFR calc Af Amer: >60 mL/min (ref >60)
GFR calc non Af Amer: >60 mL/min (ref >60)
Glucose, Bld: 98 mg/dL (ref 65–99)
Potassium: 4.3 mmol/L (ref 3.5–5.1)
Sodium: 136 mmol/L (ref 135–145)

## 2017-11-22 LAB — MAGNESIUM: Magnesium: 1.9 mg/dL (ref 1.7–2.4)

## 2017-11-22 MED ORDER — MAGNESIUM SULFATE 2 GM/50ML IV SOLN
2.0000 g | Freq: Once | INTRAVENOUS | Status: AC
  Administered 2017-11-22: 2 g via INTRAVENOUS
  Filled 2017-11-22: qty 50

## 2017-11-22 NOTE — Progress Notes (Signed)
Progress Note   Subjective   Doing well today, the patient denies CP or SOB.  No new concerns  Inpatient Medications    Scheduled Meds: . apixaban  5 mg Oral BID  . cholecalciferol  1,000 Units Oral Daily  . dofetilide  250 mcg Oral BID  . furosemide  40 mg Oral Daily  . loratadine  10 mg Oral Daily  . losartan  50 mg Oral Daily  . potassium chloride  20 mEq Oral Daily  . psyllium  1 packet Oral Daily  . rosuvastatin  10 mg Oral QHS  . sodium chloride flush  3 mL Intravenous Q12H  . sodium chloride flush  3 mL Intravenous Q12H   Continuous Infusions: . sodium chloride    . sodium chloride    . magnesium sulfate 1 - 4 g bolus IVPB 2 g (11/22/17 0953)   PRN Meds: sodium chloride, sodium chloride, acetaminophen, magnesium hydroxide, ondansetron (ZOFRAN) IV, sodium chloride, sodium chloride flush, sodium chloride flush   Vital Signs    Vitals:   11/21/17 0443 11/21/17 1518 11/21/17 2038 11/22/17 0527  BP: (!) 143/56 (!) 137/54 (!) 153/58 (!) 158/61  Pulse: (!) 54 62 63 (!) 59  Resp: 18  20 18   Temp: 97.6 F (36.4 C) 98.1 F (36.7 C) 98.9 F (37.2 C) 98.6 F (37 C)  TempSrc: Oral Oral Oral Oral  SpO2: 97% 99% 97% 98%  Weight: 159 lb 6.3 oz (72.3 kg)   160 lb 9.6 oz (72.8 kg)  Height:        Intake/Output Summary (Last 24 hours) at 11/22/2017 1005 Last data filed at 11/22/2017 0531 Gross per 24 hour  Intake 480 ml  Output 425 ml  Net 55 ml   Filed Weights   11/20/17 0603 11/21/17 0443 11/22/17 0527  Weight: 159 lb 4.8 oz (72.3 kg) 159 lb 6.3 oz (72.3 kg) 160 lb 9.6 oz (72.8 kg)    Telemetry    Sinus rhythm - Personally Reviewed  Physical Exam   GEN- The patient is well appearing, alert and oriented x 3 today.   Head- normocephalic, atraumatic Eyes-  Sclera clear, conjunctiva pink Ears- hearing intact Oropharynx- clear Neck- supple, Lungs- Clear to ausculation bilaterally, normal work of breathing Heart- Regular rate and rhythm  GI- soft, NT, ND, +  BS Extremities- no clubbing, cyanosis, or edema  MS- no significant deformity or atrophy Skin- no rash or lesion Psych- euthymic mood, full affect Neuro- strength and sensation are intact   Labs    Chemistry Recent Labs  Lab 11/20/17 1044 11/21/17 0503 11/22/17 0736  NA 140  140 138 136  K 4.2  4.1 4.0 4.3  CL 104  104 104 102  CO2 23  22 23 24   GLUCOSE 101*  104* 109* 98  BUN 17  16 24* 21*  CREATININE 0.93  0.93 0.97 0.84  CALCIUM 10.1  10.2 9.7 9.6  GFRNONAA 57*  57* 55* >60  GFRAA >60  >60 >60 >60  ANIONGAP 13  14 11 10      Hematology Recent Labs  Lab 11/16/17 1316 11/19/17 1608 11/20/17 1044  WBC 11.0* 5.7 6.2  RBC 4.22 4.56 5.16*  HGB 12.2 13.5 14.9  HCT 37.4 40.7 45.7  MCV 88.6 89.3 88.6  MCH 28.9 29.6 28.9  MCHC 32.6 33.2 32.6  RDW 13.4 13.7 13.4  PLT 180 271 259    Cardiac Enzymes Recent Labs  Lab 11/16/17 2141 11/19/17 1608  TROPONINI 0.08* 0.05*  Recent Labs  Lab 11/16/17 1322  TROPIPOC 0.06        Assessment & Plan    1.  Persistent atrial fibrillation/ atrial flutter Maintaining sinus with tikosyn Will continue to follow qtc closely On eliquis  2. Acute on chronic diastolic dysfunction Improving volume status Continue current lasix dose to keep Is and Os about even  3. CAD No ischemic symptoms  4. HTN Slightly elevated No change required today   Thompson Grayer MD, [facility] 11/22/2017 10:05 AM

## 2017-11-22 NOTE — Progress Notes (Signed)
Qt has lengthened to 535 msec. Will hold next dose of tikosyn (tonight) Repeat ecg in am to see if he can restart tikosyn tomorrow am at 124mcg bid  Thompson Grayer MD, [facility] 11/22/2017 12:52 PM

## 2017-11-22 NOTE — Progress Notes (Addendum)
Post Tikosyn dose EKG done. QTc 535. Sinus rhythm, HR 61. BP stable. Mag replaced- 1.9. Patient has no complaints at the moment. Updated MD Allred. Will continue to monitor.

## 2017-11-23 LAB — BASIC METABOLIC PANEL
Anion gap: 10 (ref 5–15)
BUN: 21 mg/dL — ABNORMAL HIGH (ref 6–20)
CO2: 26 mmol/L (ref 22–32)
Calcium: 9.8 mg/dL (ref 8.9–10.3)
Chloride: 102 mmol/L (ref 101–111)
Creatinine, Ser: 0.92 mg/dL (ref 0.44–1.00)
GFR calc Af Amer: >60 mL/min (ref >60)
GFR calc non Af Amer: 58 mL/min — ABNORMAL LOW (ref >60)
Glucose, Bld: 102 mg/dL — ABNORMAL HIGH (ref 65–99)
Potassium: 4.3 mmol/L (ref 3.5–5.1)
Sodium: 138 mmol/L (ref 135–145)

## 2017-11-23 LAB — MAGNESIUM: Magnesium: 2.2 mg/dL (ref 1.7–2.4)

## 2017-11-23 MED ORDER — DOFETILIDE 125 MCG PO CAPS
125.0000 ug | ORAL_CAPSULE | Freq: Two times a day (BID) | ORAL | Status: DC
  Administered 2017-11-23 – 2017-11-24 (×3): 125 ug via ORAL
  Filled 2017-11-23 (×3): qty 1

## 2017-11-23 NOTE — Progress Notes (Signed)
Progress Note   Subjective   Doing well today, the patient denies CP or SOB.  No new concerns  Inpatient Medications    Scheduled Meds: . apixaban  5 mg Oral BID  . cholecalciferol  1,000 Units Oral Daily  . dofetilide  125 mcg Oral BID  . furosemide  40 mg Oral Daily  . loratadine  10 mg Oral Daily  . losartan  50 mg Oral Daily  . potassium chloride  20 mEq Oral Daily  . psyllium  1 packet Oral Daily  . rosuvastatin  10 mg Oral QHS  . sodium chloride flush  3 mL Intravenous Q12H  . sodium chloride flush  3 mL Intravenous Q12H   Continuous Infusions: . sodium chloride    . sodium chloride     PRN Meds: sodium chloride, sodium chloride, acetaminophen, magnesium hydroxide, ondansetron (ZOFRAN) IV, sodium chloride, sodium chloride flush, sodium chloride flush   Vital Signs    Vitals:   11/22/17 0800 11/22/17 1300 11/22/17 2051 11/23/17 0441  BP:  138/68 (!) 166/47 (!) 145/59  Pulse: 62 61 64 (!) 57  Resp:  19 18 16   Temp:  98 F (36.7 C) 98.4 F (36.9 C) 98.4 F (36.9 C)  TempSrc:  Oral Oral Oral  SpO2: 96% 97% 96% 97%  Weight:    157 lb 9.6 oz (71.5 kg)  Height:        Intake/Output Summary (Last 24 hours) at 11/23/2017 0830 Last data filed at 11/22/2017 2233 Gross per 24 hour  Intake 891 ml  Output -  Net 891 ml   Filed Weights   11/21/17 0443 11/22/17 0527 11/23/17 0441  Weight: 159 lb 6.3 oz (72.3 kg) 160 lb 9.6 oz (72.8 kg) 157 lb 9.6 oz (71.5 kg)    Telemetry    Sinus rhythm, no ventricular arrhythmias - Personally Reviewed  Physical Exam   GEN- The patient is well appearing, alert and oriented x 3 today.   Head- normocephalic, atraumatic Eyes-  Sclera clear, conjunctiva pink Ears- hearing intact Oropharynx- clear Neck- supple, Lungs- Clear to ausculation bilaterally, normal work of breathing Heart- Regular rate and rhythm  GI- soft, NT, ND, + BS Extremities- no clubbing, cyanosis, or edema  MS- no significant deformity or atrophy Skin-  no rash or lesion Psych- euthymic mood, full affect Neuro- strength and sensation are intact   Labs    Chemistry Recent Labs  Lab 11/20/17 1044 11/21/17 0503 11/22/17 0736  NA 140  140 138 136  K 4.2  4.1 4.0 4.3  CL 104  104 104 102  CO2 23  22 23 24   GLUCOSE 101*  104* 109* 98  BUN 17  16 24* 21*  CREATININE 0.93  0.93 0.97 0.84  CALCIUM 10.1  10.2 9.7 9.6  GFRNONAA 57*  57* 55* >60  GFRAA >60  >60 >60 >60  ANIONGAP 13  14 11 10      Hematology Recent Labs  Lab 11/16/17 1316 11/19/17 1608 11/20/17 1044  WBC 11.0* 5.7 6.2  RBC 4.22 4.56 5.16*  HGB 12.2 13.5 14.9  HCT 37.4 40.7 45.7  MCV 88.6 89.3 88.6  MCH 28.9 29.6 28.9  MCHC 32.6 33.2 32.6  RDW 13.4 13.7 13.4  PLT 180 271 259    Cardiac Enzymes Recent Labs  Lab 11/16/17 2141 11/19/17 1608  TROPONINI 0.08* 0.05*    Recent Labs  Lab 11/16/17 1322  TROPIPOC 0.06        Assessment & Plan  1.  Persistent atrial fibrillation Maintaining sinus rhythm Qt prolonged yesterday --> tikosyn reduced to 125 mcg BID On eliquis  2. Acute on chronic diastolic dysfunction Improving  3. CAD No ischemic symptoms  4. HTN At times elevated Increase losartan to 100mg  daily  Hopefully home tomorrow  Thompson Grayer MD, [facility] 11/23/2017 8:30 AM

## 2017-11-24 LAB — MAGNESIUM: Magnesium: 2.1 mg/dL (ref 1.7–2.4)

## 2017-11-24 LAB — BASIC METABOLIC PANEL
Anion gap: 9 (ref 5–15)
BUN: 21 mg/dL — ABNORMAL HIGH (ref 6–20)
CO2: 28 mmol/L (ref 22–32)
Calcium: 9.6 mg/dL (ref 8.9–10.3)
Chloride: 102 mmol/L (ref 101–111)
Creatinine, Ser: 0.88 mg/dL (ref 0.44–1.00)
GFR calc Af Amer: >60 mL/min (ref >60)
GFR calc non Af Amer: >60 mL/min (ref >60)
Glucose, Bld: 95 mg/dL (ref 65–99)
Potassium: 4 mmol/L (ref 3.5–5.1)
Sodium: 139 mmol/L (ref 135–145)

## 2017-11-24 MED ORDER — DOFETILIDE 125 MCG PO CAPS
125.0000 ug | ORAL_CAPSULE | Freq: Two times a day (BID) | ORAL | Status: DC
  Filled 2017-11-24 (×3): qty 28

## 2017-11-24 MED ORDER — LOSARTAN POTASSIUM 50 MG PO TABS: 50.0000 mg | ORAL_TABLET | Freq: Every day | ORAL | 2 refills | Status: DC

## 2017-11-24 MED ORDER — LOSARTAN POTASSIUM 50 MG PO TABS: 50.0000 mg | ORAL_TABLET | Freq: Every day | ORAL | 3 refills | Status: DC

## 2017-11-24 MED ORDER — FUROSEMIDE 40 MG PO TABS: 40.0000 mg | ORAL_TABLET | Freq: Every day | ORAL | 3 refills | Status: DC

## 2017-11-24 MED ORDER — POTASSIUM CHLORIDE CRYS ER 20 MEQ PO TBCR: 20.0000 meq | EXTENDED_RELEASE_TABLET | Freq: Every day | ORAL | 3 refills | Status: DC

## 2017-11-24 MED ORDER — DOFETILIDE 125 MCG PO CAPS: 125.0000 ug | ORAL_CAPSULE | Freq: Two times a day (BID) | ORAL | Status: DC

## 2017-11-24 MED ORDER — OFF THE BEAT BOOK
Freq: Once | Status: AC
  Administered 2017-11-24: 06:00:00
  Filled 2017-11-24: qty 1

## 2017-11-24 MED ORDER — DOFETILIDE 125 MCG PO CAPS: 125.0000 ug | ORAL_CAPSULE | Freq: Two times a day (BID) | ORAL | 2 refills | Status: DC

## 2017-11-24 MED ORDER — POTASSIUM CHLORIDE CRYS ER 20 MEQ PO TBCR: 20.0000 meq | EXTENDED_RELEASE_TABLET | Freq: Every day | ORAL | 2 refills | Status: DC

## 2017-11-24 MED ORDER — FUROSEMIDE 40 MG PO TABS: 40.0000 mg | ORAL_TABLET | Freq: Every day | ORAL | 2 refills | Status: DC

## 2017-11-24 NOTE — Discharge Summary (Signed)
DISCHARGE SUMMARY    Patient ID: Barbara [name],  MRN: [id number], DOB/AGE: Dec 28, 1940 77 y.o.  Admit date: 11/16/2017 Discharge date: 11/24/2017  Primary Care Physician: Laurey Morale, MD  Primary Cardiologist: Dr. Claiborne Billings Electrophysiologist: Dr. Rayann Heman, new this admission  Primary Discharge Diagnosis:  1.  Acute/chronic CHF (diastolic) 2.  Paroxysmal atrial fibrillation status post Tikosyn loading this admission      CHA2DS2Vasc is 5, on Eliquis 3. Bradycardia  Secondary Discharge Diagnosis:  1. CAD 2. HTN  Allergies  Allergen Reactions  . Lidocaine Anaphylaxis  . Morphine Other (See Comments)    Body shuts down  . Tape Itching  . Procaine Hcl Rash    Rash. Anything with caine in it   . Sulfonamide Derivatives Hives  . Vytorin [Ezetimibe-Simvastatin] Other (See Comments)    Pt unsure of allergy      Procedures This Admission:  1.  Tikosyn loading    Brief HPI: Barbara [name] is a 77 y.o. female with a history of CAD (PCI in 2000), HTN, HFpEF, HLD,and new AFlutter/fib admitted to [facility] 11/16/16 with weakness/dizziness, SOB, fatigue, and an episode of chest tenderness noted SB, was admitted her metoprolol held, and IV diuresis started for fluid OL    Hospital Course:  The patient was admitted, her BB held.  Out patient She was seen by Dr Claiborne Billings in the office on 1/7 and found to be in new onset atrial flutter that was rate controlled. She was started on Metoprolol and Eliquis, developed weakness, dizziness, and found in SB, her metoprolol held and resulted in AF w/RVR.  Cardiology service admitted, episode of chest tenderness (epigastric) was not felt to be clearly anginal and not planned for ischemic w/u.  Echowith EF 70-75%, no wall motion abnormalities, grade 2 diastolic dysfunction, andmildly increased subvalvular outflow tract gradient due to hyperdynamic LV contraction - With regards to mild LVOT obstruction, recommendation was to avoid  vasodilators and dehydration. EP service was consulted to the case and decided to initiate Tikosyn once labs were optimized. Tikosyn was initiated.  Renal function and electrolytes were followed.  Her QTc lengthened, requiring down-titration ultimately to 129mcg dose, at this dose, she was monitored for an additional 3 doses and her QTc remained stable.  She converted to SR with drug and did not require DCCV.  She was monitored until discharge on telemetry which demonstrated SB/SR 50's-60's.  The patient has been ambulatory and feeling well.  On the day of discharge, the patient was examined by Dr. Rayann Heman who considered her stable for discharge to home.  Follow-up has been arranged with the AFib clinic in 1 week and with Dr Rayann Heman in 4 weeks.   Physical Exam: Vitals:   11/23/17 0900 11/23/17 1300 11/23/17 2051 11/24/17 0348  BP: (!) 140/44 (!) 138/46 (!) 144/46 (!) 169/51  Pulse:  (!) 59 (!) 57 (!) 59  Resp:  20 18 16   Temp:  98.1 F (36.7 C) 98.8 F (37.1 C) 98.4 F (36.9 C)  TempSrc:  Oral Oral Oral  SpO2:  99% 98% 99%  Weight:    159 lb (72.1 kg)  Height:        GEN- The patient is well appearing, alert and oriented x 3 today.   HEENT: normocephalic, atraumatic; sclera clear, conjunctiva pink; hearing intact; oropharynx clear; neck supple, no JVP Lymph- no cervical lymphadenopathy Lungs- CTA b/l, normal work of breathing.  No wheezes, rales, rhonchi Heart- RRR, no murmurs, rubs or gallops, PMI  not laterally displaced GI- soft, non-tender, non-distended Extremities- no clubbing, cyanosis, or edema MS- no significant deformity, age appropriate atrophy Skin- warm and dry, no rash or lesion Psych- euthymic mood, full affect Neuro- strength and sensation are intact   Labs:   Lab Results  Component Value Date   WBC 6.2 11/20/2017   HGB 14.9 11/20/2017   HCT 45.7 11/20/2017   MCV 88.6 11/20/2017   PLT 259 11/20/2017    Recent Labs  Lab 11/24/17 0339  NA 139  K 4.0  CL 102    CO2 28  BUN 21*  CREATININE 0.88  CALCIUM 9.6  GLUCOSE 95     Discharge Medications:  Allergies as of 11/24/2017      Reactions   Lidocaine Anaphylaxis   Morphine Other (See Comments)   Body shuts down   Tape Itching   Procaine Hcl Rash   Rash. Anything with caine in it    Sulfonamide Derivatives Hives   Vytorin [ezetimibe-simvastatin] Other (See Comments)   Pt unsure of allergy       Medication List    STOP taking these medications   amLODipine 5 MG tablet Commonly known as:  NORVASC   aspirin EC 81 MG tablet   diphenhydramine-acetaminophen 25-500 MG Tabs tablet Commonly known as:  TYLENOL PM   losartan-hydrochlorothiazide 100-25 MG tablet Commonly known as:  HYZAAR   metoprolol tartrate 50 MG tablet Commonly known as:  LOPRESSOR   OVER THE COUNTER MEDICATION   spironolactone 25 MG tablet Commonly known as:  ALDACTONE     TAKE these medications   apixaban 5 MG Tabs tablet Commonly known as:  ELIQUIS Take 1 tablet (5 mg total) by mouth 2 (two) times daily.   D3-1000 1000 units capsule Generic drug:  Cholecalciferol Take 1,000 Units by mouth daily.   desoximetasone 0.25 % cream Commonly known as:  TOPICORT APPLY 1 APPLICATION TOPICALLY 2 TIMES DAILY What changed:  See the new instructions. Notes to patient:  No changes are being made, continue to use as you have been instructed by the original prescribing doctor   dextromethorphan 30 MG/5ML liquid Commonly known as:  DELSYM Take 15 mg by mouth as needed for cough. Reported on 05/22/2016   dofetilide 125 MCG capsule Commonly known as:  TIKOSYN Take 1 capsule (125 mcg total) by mouth 2 (two) times daily.   Fish Oil 1200 MG Cpdr Take 1 capsule by mouth daily.   furosemide 40 MG tablet Commonly known as:  LASIX Take 1 tablet (40 mg total) by mouth daily. Start taking on:  11/25/2017   loratadine 10 MG tablet Commonly known as:  CLARITIN Take 10 mg by mouth daily as needed for allergies.    losartan 50 MG tablet Commonly known as:  COZAAR Take 1 tablet (50 mg total) by mouth daily. Start taking on:  11/25/2017   multivitamin with minerals Tabs tablet Take 1 tablet by mouth daily.   potassium chloride SA 20 MEQ tablet Commonly known as:  K-DUR,KLOR-CON Take 1 tablet (20 mEq total) by mouth daily. Start taking on:  11/25/2017   rosuvastatin 10 MG tablet Commonly known as:  CRESTOR Take 1 tablet (10 mg total) by mouth daily. What changed:  when to take this   triamcinolone 55 MCG/ACT nasal inhaler Commonly known as:  NASACORT Place 1 spray into both nostrils daily as needed (Allergies).       Disposition:  Home  Discharge Instructions    Diet - low sodium heart healthy  Complete by:  As directed    Increase activity slowly   Complete by:  As directed      Follow-up Information    [place] Follow up on 12/01/2017.   Specialty:  Cardiology Why:  10:30AM Contact information: [address] [id number] [place] [phone]       Thompson Grayer, MD Follow up on 12/22/2017.   Specialty:  Cardiology Why:  9:15AM Contact information: [place] [address] [phone]           Duration of Discharge Encounter: Greater than 30 minutes including physician time.   Signed, Tommye Standard, PA-C 11/24/2017 1:02 PM   I have seen, examined the patient, and reviewed the above assessment and plan.  Changes to above are made where necessary.  On exam,  RRR.  QT stable.  DC to home.  Follow-up in AF clinic.  Co Sign: Thompson Grayer, MD 11/24/2017 9:28 PM

## 2017-12-01 ENCOUNTER — Encounter (HOSPITAL_COMMUNITY): Payer: Self-pay | Admitting: Nurse Practitioner

## 2017-12-01 ENCOUNTER — Ambulatory Visit (HOSPITAL_COMMUNITY)
Admit: 2017-12-01 | Discharge: 2017-12-01 | Disposition: A | Discharge status: Home / Self Care | Payer: Medicare Other | Attending: Nurse Practitioner | Admitting: Nurse Practitioner

## 2017-12-01 VITALS — BP 146/60 | HR 62 | Ht 66.0 in | Wt 159.0 lb

## 2017-12-01 DIAGNOSIS — I509 Heart failure, unspecified: Secondary | ICD-10-CM | POA: Diagnosis not present

## 2017-12-01 DIAGNOSIS — I110 Hypertensive heart disease with heart failure: Secondary | ICD-10-CM | POA: Diagnosis not present

## 2017-12-01 DIAGNOSIS — Z79899 Other long term (current) drug therapy: Secondary | ICD-10-CM | POA: Insufficient documentation

## 2017-12-01 DIAGNOSIS — Z87891 Personal history of nicotine dependence: Secondary | ICD-10-CM | POA: Diagnosis not present

## 2017-12-01 DIAGNOSIS — I2510 Atherosclerotic heart disease of native coronary artery without angina pectoris: Secondary | ICD-10-CM | POA: Diagnosis not present

## 2017-12-01 DIAGNOSIS — Z7901 Long term (current) use of anticoagulants: Secondary | ICD-10-CM | POA: Diagnosis not present

## 2017-12-01 DIAGNOSIS — M1990 Unspecified osteoarthritis, unspecified site: Secondary | ICD-10-CM | POA: Insufficient documentation

## 2017-12-01 DIAGNOSIS — I4892 Unspecified atrial flutter: Secondary | ICD-10-CM | POA: Insufficient documentation

## 2017-12-01 DIAGNOSIS — I481 Persistent atrial fibrillation: Secondary | ICD-10-CM

## 2017-12-01 DIAGNOSIS — I4891 Unspecified atrial fibrillation: Secondary | ICD-10-CM | POA: Insufficient documentation

## 2017-12-01 DIAGNOSIS — I4819 Other persistent atrial fibrillation: Secondary | ICD-10-CM

## 2017-12-01 DIAGNOSIS — Z955 Presence of coronary angioplasty implant and graft: Secondary | ICD-10-CM | POA: Diagnosis not present

## 2017-12-01 DIAGNOSIS — E785 Hyperlipidemia, unspecified: Secondary | ICD-10-CM | POA: Insufficient documentation

## 2017-12-01 LAB — BASIC METABOLIC PANEL
Anion gap: 12 (ref 5–15)
BUN: 17 mg/dL (ref 6–20)
CO2: 26 mmol/L (ref 22–32)
Calcium: 10.2 mg/dL (ref 8.9–10.3)
Chloride: 100 mmol/L — ABNORMAL LOW (ref 101–111)
Creatinine, Ser: 0.95 mg/dL (ref 0.44–1.00)
GFR calc Af Amer: >60 mL/min (ref >60)
GFR calc non Af Amer: 56 mL/min — ABNORMAL LOW (ref >60)
Glucose, Bld: 99 mg/dL (ref 65–99)
Potassium: 3.8 mmol/L (ref 3.5–5.1)
Sodium: 138 mmol/L (ref 135–145)

## 2017-12-01 LAB — MAGNESIUM: Magnesium: 1.8 mg/dL (ref 1.7–2.4)

## 2017-12-01 NOTE — Progress Notes (Signed)
Primary Care Physician: Laurey Morale, MD Referring Physician: [facility] f/u EP: Dr. Rayann Heman Cardiologist: Dr. Marcell Barlow Barbara [name] is a 77 y.o. female with a h/o CAD (PCI in 2000), HTN, HFpEF, HLD,and new AFlutter/fib admitted to [facility] 11/16/16 with weakness/dizziness, SOB, fatigue, and an episode of chest tenderness noted SB, was admitted her metoprolol held, and IV diuresis started for fluid OL  She was seen by Dr Claiborne Billings in the office on 1/7 and found to be in new onset atrial flutter that was rate controlled. She was started on Metoprolol and Eliquis, developed weakness, dizziness, and found in SB, her metoprolol held and resulted in AF w/RVR.  Cardiology service admitted, episode of chest tenderness (epigastric) was not felt to be clearly anginal and not planned for ischemic w/u.  Echowith EF 70-75%, no wall motion abnormalities, grade 2 diastolic dysfunction, andmildly increased subvalvular outflow tract gradient due to hyperdynamic LV contraction - With regards to mild LVOT obstruction, recommendation was to avoid vasodilators and dehydration. EP service was consulted to the case and decided to initiate Tikosyn once labs were optimized. Tikosyn was initiated.  Renal function and electrolytes were followed.  Her QTc lengthened, requiring down-titration ultimately to 121mcg dose, at this dose, she was monitored for an additional 3 doses and her QTc remained stable.  She converted to SR with drug and did not require DCCV.  She was monitored until discharge on telemetry which demonstrated SB/SR 50's-60's.  The patient was ambulatory and feeling well.  On the day of discharge, the patient was examined by Dr. Rayann Heman who considered her stable for discharge to home.  Follow-up has been arranged with the AFib clinic in 1 week and with Dr Rayann Heman in 4 weeks.   In the afib clinic,1/ 28, she feels well and is in SR. Qtc stable at 422 ms.    Today, she denies symptoms of palpitations, chest pain,  shortness of breath, orthopnea, PND, lower extremity edema, dizziness, presyncope, syncope, or neurologic sequela. The patient is tolerating medications without difficulties and is otherwise without complaint today.   Past Medical History:  Diagnosis Date  . A-fib ([place])   . Allergy   . CAD (coronary artery disease)    sees Dr. Shelva Majestic  cardiac stents - 2000  . Colon polyps   . Heart murmur   . History of stress test    show normal perfusion without scar or ischemia, post EF 68%  . Hx of echocardiogram    show an EF 55%-60% range with grade 1 diastolic dysfunction, she had mitral anular calcification with mild MR, moderate LA dilation and mild pulmonary hypertension with a PA estimated pressure of 68mm  . Hyperlipidemia   . Hypertension   . Osteoarthritis   . Pneumonia    hx of 2015   . PONV (postoperative nausea and vomiting)    Past Surgical History:  Procedure Laterality Date  . ABDOMINAL HYSTERECTOMY  1971  . COLONOSCOPY  01-05-14   per Dr. Teena Irani, clear, no repeats needed   . CORONARY STENT PLACEMENT  2000   in LAD  . KNEE ARTHROSCOPY Left 01/06/2015   Procedure: LEFT KNEE ARTHROSCOPY, abrasion chondroplasty of the medial femerol condryl,medial and lateral menisectomy, microfracture , synovectomy of the suprpatellar pouch;  Surgeon: Latanya Maudlin, MD;  Location: [facility] ORS;  Service: Orthopedics;  Laterality: Left;    Current Outpatient Medications  Medication Sig Dispense Refill  . apixaban (ELIQUIS) 5 MG TABS tablet Take 1 tablet (5 mg  total) by mouth 2 (two) times daily. 180 tablet 3  . Cholecalciferol (D3-1000) 1000 units capsule Take 1,000 Units by mouth daily.    Marland Kitchen desoximetasone (TOPICORT) 0.25 % cream APPLY 1 APPLICATION TOPICALLY 2 TIMES DAILY (Patient taking differently: APPLY 1 APPLICATION TOPICALLY 2 TIMES daily as needed) 60 g 5  . dextromethorphan (DELSYM) 30 MG/5ML liquid Take 15 mg by mouth as needed for cough. Reported on 05/22/2016    . dofetilide  (TIKOSYN) 125 MCG capsule Take 1 capsule (125 mcg total) by mouth 2 (two) times daily. 180 capsule 2  . furosemide (LASIX) 40 MG tablet Take 1 tablet (40 mg total) by mouth daily. 30 tablet 3  . loratadine (CLARITIN) 10 MG tablet Take 10 mg by mouth daily as needed for allergies.     Marland Kitchen losartan (COZAAR) 50 MG tablet Take 1 tablet (50 mg total) by mouth daily. 30 tablet 3  . Multiple Vitamin (MULTIVITAMIN WITH MINERALS) TABS tablet Take 1 tablet by mouth daily.    . Omega-3 Fatty Acids (FISH OIL) 1200 MG CPDR Take 1 capsule by mouth daily.    . potassium chloride SA (K-DUR,KLOR-CON) 20 MEQ tablet Take 1 tablet (20 mEq total) by mouth daily. 30 tablet 3  . rosuvastatin (CRESTOR) 10 MG tablet Take 1 tablet (10 mg total) by mouth daily. (Patient taking differently: Take 10 mg by mouth at bedtime. ) 90 tablet 3  . triamcinolone (NASACORT) 55 MCG/ACT nasal inhaler Place 1 spray into both nostrils daily as needed (Allergies).      No current facility-administered medications for this encounter.     Allergies  Allergen Reactions  . Lidocaine Anaphylaxis  . Morphine Other (See Comments)    Body shuts down  . Tape Itching  . Procaine Hcl Rash    Rash. Anything with caine in it   . Sulfonamide Derivatives Hives  . Vytorin [Ezetimibe-Simvastatin] Other (See Comments)    Pt unsure of allergy     Social History   Socioeconomic History  . Marital status: Married    Spouse name: Not on file  . Number of children: Not on file  . Years of education: Not on file  . Highest education level: Not on file  Social Needs  . Financial resource strain: Not on file  . Food insecurity - worry: Not on file  . Food insecurity - inability: Not on file  . Transportation needs - medical: Not on file  . Transportation needs - non-medical: Not on file  Occupational History  . Not on file  Tobacco Use  . Smoking status: Former Smoker    Last attempt to quit: 11/04/1996    Years since quitting: 21.0  .  Smokeless tobacco: Never Used  Substance and Sexual Activity  . Alcohol use: No    Alcohol/week: 0.0 oz  . Drug use: No  . Sexual activity: Not on file  Other Topics Concern  . Not on file  Social History Narrative  . Not on file    Family History  Problem Relation Age of Onset  . Cancer Maternal Grandmother   . Heart disease Maternal Grandfather     ROS- All systems are reviewed and negative except as per the HPI above  Physical Exam: Vitals:   12/01/17 1037  BP: (!) 146/60  Pulse: 62  Weight: 159 lb (72.1 kg)  Height: 5\' 6"  (1.676 m)   Wt Readings from Last 3 Encounters:  12/01/17 159 lb (72.1 kg)  11/24/17 159 lb (72.1 kg)  11/14/17 165 lb 12.8 oz (75.2 kg)    Labs: Lab Results  Component Value Date   NA 138 12/01/2017   K 3.8 12/01/2017   CL 100 (L) 12/01/2017   CO2 26 12/01/2017   GLUCOSE 99 12/01/2017   BUN 17 12/01/2017   CREATININE 0.95 12/01/2017   CALCIUM 10.2 12/01/2017   MG 1.8 12/01/2017   No results found for: INR Lab Results  Component Value Date   CHOL 143 11/06/2017   HDL 57.60 11/06/2017   LDLCALC 63 11/06/2017   TRIG 111.0 11/06/2017     GEN- The patient is well appearing, alert and oriented x 3 today.   Head- normocephalic, atraumatic Eyes-  Sclera clear, conjunctiva pink Ears- hearing intact Oropharynx- clear Neck- supple, no JVP Lymph- no cervical lymphadenopathy Lungs- Clear to ausculation bilaterally, normal work of breathing Heart- Regular rate and rhythm, no murmurs, rubs or gallops, PMI not laterally displaced GI- soft, NT, ND, + BS Extremities- no clubbing, cyanosis, or edema MS- no significant deformity or atrophy Skin- no rash or lesion Psych- euthymic mood, full affect Neuro- strength and sensation are intact  EKG- NSR at 62 bpm, pr int 162 ms, qrs int 82 ms, qtc 422 ms Epic records reviewed    Assessment and Plan: 1. New onset afib with RVR Now in SR with Tikosyn 125 mcg bid General precautions re Tikosyn  reviewed Reminded no OTC benadryl and to be aware of many drug/drug interactions Bmet/matg today  2. CHF Weight stable No obvious fluid  Continue furosemide  Weigh daily  Avoid salt  3. Chadsvasc score of at least 5 Continue apixaban 5 mg bid  General bleeding precautions discussed and importance of not missing doses  F/u with Dr. Claiborne Billings 1/30 Dr. Rayann Heman 2/18 afib clinic as needed   Geroge Baseman. Carnella Fryman, [facility] [address] [place], [place] [address] [phone]

## 2017-12-01 NOTE — Addendum Note (Signed)
Encounter addended by: Sherran Needs, NP on: 12/01/2017 1:35 PM  Actions taken: LOS modified

## 2017-12-03 ENCOUNTER — Encounter: Payer: Self-pay | Admitting: Cardiovascular Disease

## 2017-12-03 ENCOUNTER — Ambulatory Visit (INDEPENDENT_AMBULATORY_CARE_PROVIDER_SITE_OTHER): Payer: Medicare Other | Admitting: Cardiovascular Disease

## 2017-12-03 VITALS — BP 162/62 | HR 64 | Ht 66.5 in | Wt 158.0 lb

## 2017-12-03 DIAGNOSIS — Z7901 Long term (current) use of anticoagulants: Secondary | ICD-10-CM

## 2017-12-03 DIAGNOSIS — I2510 Atherosclerotic heart disease of native coronary artery without angina pectoris: Secondary | ICD-10-CM | POA: Diagnosis not present

## 2017-12-03 DIAGNOSIS — I10 Essential (primary) hypertension: Secondary | ICD-10-CM

## 2017-12-03 DIAGNOSIS — E785 Hyperlipidemia, unspecified: Secondary | ICD-10-CM

## 2017-12-03 DIAGNOSIS — I4892 Unspecified atrial flutter: Secondary | ICD-10-CM | POA: Diagnosis not present

## 2017-12-03 MED ORDER — APIXABAN 5 MG PO TABS: 5.0000 mg | ORAL_TABLET | Freq: Two times a day (BID) | ORAL | 3 refills | Status: DC

## 2017-12-03 NOTE — Progress Notes (Signed)
Patient ID: Barbara [name], female   DOB: Nov 03, 1941, 77 y.o.   MRN: [id number]    Primary MD: Dr. Alysia Penna  HPI: Barbara [name] is a 77 y.o. female who presents for a 7 month follow-up cardiology evaluation.  I last saw her on 11/10/2017 as an add-on  after she called the office complaining that her heart rate was irregular and episodically fast..  She presents for follow-up evaluation.  Barbara [name] has CAD and in November 2000 underwent stenting of a high-grade LAD stenosis with an [id number] 3.518 mm bare-metal stent. A nuclear perfusion study in November 2013 was unchanged from previously and continued to show normal perfusion without scar or ischemia. Ejection fraction was 68%. An echo Doppler study revealed an ejection fraction in the 55-60% range with grade 1 diastolic dysfunction. 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  Additional problems include hypertension as well as hyperlipidemia. Over the past several months she has noticed that she is more tired.  She also has noticed more shortness of breath with activity.  She has noted some vague indigestion symptoms.  She admits to some mild ankle swelling.  She has been taking Crestor 10 mg and denies myalgias.  She has been on losartan HCT 100/25 as well as amlodipine 10 mg and Lasix 20 mg for blood pressure and peripheral edema.  When I saw her in November.  Her blood pressure was controlled.  She was bradycardic not on any rate control medication and I raise the possibility of a component of chronotropic incompetence.  To further evaluate her exertional dyspnea.  She underwent a nuclear perfusion study on 10/06/2014 which was normal.  Post stress ejection fraction was 67%.  An echo Doppler study done on 10/06/2014 showed an EF of 60-65% with moderate left ventricular hypertrophy.  There was grade 1 diastolic dysfunction.  She had  indeterminate LV filling pressure.  There was mild aortic sclerosis without stenosis, mitral annular calcification with mild MR, and mild dilatation of her left atrium with mild tricuspid regurgitation.  Pulmonary pressures were minimally elevated at 31 mm.  She underwent successful left knee surgery by Dr. Lindwood Qua in March 2016.  She tolerated surgery well from a cardiovascular standpoint.  An echo Doppler study in March 2017 showed mild LVH with vigorous LV function with an EF of 65-70%.  There was grade 2 diastolic dysfunction.  There was moderate aortic sclerosis without stenosis.  There was mild LA dilation.  PA pressures were upper normal.  When I  saw her in May 2018, she had noticed issues with ankle swelling.  She had stopped taking furosemide since his had expired.  I elected to institute spironolactone with her moderate diastolic dysfunction.  She was taking amlodipine 10 mg, losartan HCT 100/25 mg and she has been on spironolactone 25 mg daily.    When I  saw her in August 2018 she was in sinus rhythm and had bilateral ankle and feet swelling.  I reduced her amlodipine to 5 mg and further titrated spironolactone to 25 mg twice a day.  Recently she has been feeling well.  She went to [place] over Christmas and did well.  However, since 11/05/2017 she noticed her heart rate being a little faster and subsequently felt some irregularity.  I saw her in the office on 11/10/2017 and she was in atrial flutter with variable block.  Her blood pressure was elevated.  I started metoprolol 25 mg twice a day and started eliquis 5 mg twice a day.  Subsequently, an echo Doppler study in general he 14 2019 showed an EF of 70-75% with grade 2 diastolic dysfunction.  There was mild to moderate MR, moderately severe LA enlargement, and PA pressure was increased at 53 mm.  She was hospitalized and underwent Tikosyn loading with Dr. Rayann Heman, which ultimately pharmacologically cardioverted her back to sinus  rhythm.  She was seen in follow-up in the atrial fibrillation clinic on 12/01/2017.  At that time, she was maintaining sinus rhythm and her QT interval was stable.  She presents for follow-up evaluation.  Past Medical History:  Diagnosis Date  . A-fib ([place])   . Allergy   . CAD (coronary artery disease)    sees Dr. Shelva Majestic  cardiac stents - 2000  . Colon polyps   . Heart murmur   . History of stress test    show normal perfusion without scar or ischemia, post EF 68%  . Hx of echocardiogram    show an EF 55%-60% range with grade 1 diastolic dysfunction, she had mitral anular calcification with mild MR, moderate LA dilation and mild pulmonary hypertension with a PA estimated pressure of 34m  . Hyperlipidemia   . Hypertension   . Osteoarthritis   . Pneumonia    hx of 2015   . PONV (postoperative nausea and vomiting)     Past Surgical History:  Procedure Laterality Date  . ABDOMINAL HYSTERECTOMY  1971  . COLONOSCOPY  01-05-14   per Dr. JTeena Irani clear, no repeats needed   . CORONARY STENT PLACEMENT  2000   in LAD  . KNEE ARTHROSCOPY Left 01/06/2015   Procedure: LEFT KNEE ARTHROSCOPY, abrasion chondroplasty of the medial femerol condryl,medial and lateral menisectomy, microfracture , synovectomy of the suprpatellar pouch;  Surgeon: RLatanya Maudlin MD;  Location: [facility];  Service: Orthopedics;  Laterality: Left;    Allergies  Allergen Reactions  . Lidocaine Anaphylaxis  . Morphine Other (See Comments)    Body shuts down  . Tape Itching  . Procaine Hcl Rash    Rash. Anything with caine in it   . Sulfonamide Derivatives Hives  . Vytorin [Ezetimibe-Simvastatin] Other (See Comments)    Pt unsure of allergy     Current Outpatient Medications  Medication Sig Dispense Refill  . apixaban (ELIQUIS) 5 MG TABS tablet Take 1 tablet (5 mg total) by mouth 2 (two) times daily. 180 tablet 3  . Cholecalciferol (D3-1000) 1000 units capsule Take 1,000 Units by mouth daily.    .Marland Kitchen desoximetasone (TOPICORT) 0.25 % cream APPLY 1 APPLICATION TOPICALLY 2 TIMES DAILY (Patient taking differently: APPLY 1 APPLICATION TOPICALLY 2 TIMES daily as needed) 60 g 5  . dextromethorphan (DELSYM) 30 MG/5ML liquid Take 15 mg by mouth as needed for cough. Reported on 05/22/2016    . dofetilide (TIKOSYN) 125 MCG capsule Take 1 capsule (125 mcg total) by mouth 2 (two) times daily. 180 capsule 2  . furosemide (LASIX) 40 MG tablet Take 1 tablet (40 mg total) by mouth daily. 30 tablet 3  . loratadine (CLARITIN) 10 MG tablet Take 10 mg by mouth daily as needed for allergies.     .Marland Kitchenlosartan (COZAAR) 50 MG tablet Take 1 tablet (50 mg total) by mouth daily. 30 tablet 3  . Multiple Vitamin (MULTIVITAMIN WITH MINERALS) TABS tablet Take 1 tablet by mouth daily.    . Omega-3 Fatty Acids (FISH OIL) 1200 MG CPDR  Take 1 capsule by mouth daily.    . potassium chloride SA (K-DUR,KLOR-CON) 20 MEQ tablet Take 1 tablet (20 mEq total) by mouth daily. 30 tablet 3  . rosuvastatin (CRESTOR) 10 MG tablet Take 1 tablet (10 mg total) by mouth daily. (Patient taking differently: Take 10 mg by mouth at bedtime. ) 90 tablet 3  . triamcinolone (NASACORT) 55 MCG/ACT nasal inhaler Place 1 spray into both nostrils daily as needed (Allergies).      No current facility-administered medications for this visit.     Social History   Socioeconomic History  . Marital status: Married    Spouse name: Not on file  . Number of children: Not on file  . Years of education: Not on file  . Highest education level: Not on file  Social Needs  . Financial resource strain: Not on file  . Food insecurity - worry: Not on file  . Food insecurity - inability: Not on file  . Transportation needs - medical: Not on file  . Transportation needs - non-medical: Not on file  Occupational History  . Not on file  Tobacco Use  . Smoking status: Former Smoker    Last attempt to quit: 11/04/1996    Years since quitting: 21.0  . Smokeless tobacco:  Never Used  Substance and Sexual Activity  . Alcohol use: No    Alcohol/week: 0.0 oz  . Drug use: No  . Sexual activity: Not on file  Other Topics Concern  . Not on file  Social History Narrative  . Not on file    Family History  Problem Relation Age of Onset  . Cancer Maternal Grandmother   . Heart disease Maternal Grandfather     ROS General: Negative; No fevers, chills, or night sweats;  HEENT: Negative; No changes in vision or hearing, sinus congestion, difficulty swallowing Pulmonary: Negative; No cough, wheezing, hemoptysis Cardiovascular:  See HPI GI: Negative; No nausea, vomiting, diarrhea, or abdominal pain GU: Negative; No dysuria, hematuria, or difficulty voiding Musculoskeletal: history of plantar fasciitis; status post left knee surgery March 2016 no myalgias, joint pain, or weakness Hematologic/Oncology: Negative; no easy bruising, bleeding Endocrine: Negative; no heat/cold intolerance; no diabetes Neuro: Negative; no changes in balance, headaches Skin: Negative; No rashes or skin lesions Psychiatric: Negative; No behavioral problems, depression Sleep: Negative; No snoring, daytime sleepiness, hypersomnolence, bruxism, restless legs, hypnogognic hallucinations, no cataplexy Other comprehensive 14 point system review is negative.   PE BP (!) 162/62   Pulse 64   Ht 5' 6.5" (1.689 m)   Wt 158 lb (71.7 kg)   BMI 25.12 kg/m    Repeat blood pressure by me was 124/64  Wt Readings from Last 3 Encounters:  12/03/17 158 lb (71.7 kg)  12/01/17 159 lb (72.1 kg)  11/24/17 159 lb (72.1 kg)     Physical Exam BP (!) 162/62   Pulse 64   Ht 5' 6.5" (1.689 m)   Wt 158 lb (71.7 kg)   BMI 25.12 kg/m  General: Alert, oriented, no distress.  Skin: normal turgor, no rashes, warm and dry HEENT: Normocephalic, atraumatic. Pupils equal round and reactive to light; sclera anicteric; extraocular muscles intact; Fundi ** Nose without nasal septal  hypertrophy Mouth/Parynx benign; Mallinpatti scale Neck: No JVD, no carotid bruits; normal carotid upstroke Lungs: clear to ausculatation and percussion; no wheezing or rales Chest wall: without tenderness to palpitation Heart: PMI not displaced, RRR, s1 s2 normal, 1/6 systolic murmur, no diastolic murmur, no rubs, gallops, thrills, or heaves  Abdomen: soft, nontender; no hepatosplenomehaly, BS+; abdominal aorta nontender and not dilated by palpation. Back: no CVA tenderness Pulses 2+ Musculoskeletal: full range of motion, normal strength, no joint deformities Extremities: Improvement in previous lower extremity edema;no clubbing cyanosis, Homan's sign negative  Neurologic: grossly nonfocal; Cranial nerves grossly wnl Psychologic: Normal mood and affect   ECG (independently read by me): normal sinus rhythm at 64 bpm.  LVH with repolarization changes.  QTc interval 410 ms.  PR interval 174 ms.  11/10/2017 ECG (independently read by me): Atrial flutter with variable block and average heart rate at 65 bpm.  LVH with repolarization changes.  August 2018 ECG (independently read by me): Sinus bradycardia with PAC.  LVH with repolarization changes.  May 2018 ECG (independently read by me): Sinus bradycardia 50 bpm.  LVH with repolarization changes in leads II, III, and F, 1, V3 through V6.  February 2017 ECG (independently read by me): Probable low atrial rhythm with a ventricular rate at 49 bpm.  Previous seen noted diffuse T-wave abnormality in leads II, III, and F, V3 through V6, and lead 1.  February 2016 ECG (independently read by me): Marked sinus bradycardia at 45 bpm with LVH and diffuse T-wave inversion II, III, and F lead 1, and V3 through V6; minimally changed from one year previously.   November 2014 ECG: Sinus bradycardia with sinus arrhythmia. Previously noted LVH with marked repolarization T-wave abnormalities which have been present previously.  LABS: BMP Latest Ref Rng & Units  12/01/2017 11/24/2017 11/23/2017  Glucose 65 - 99 mg/dL 99 95 102(H)  BUN 6 - 20 mg/dL 17 21(H) 21(H)  Creatinine 0.44 - 1.00 mg/dL 0.95 0.88 0.92  BUN/Creat Ratio 12 - 28 - - -  Sodium 135 - 145 mmol/L 138 139 138  Potassium 3.5 - 5.1 mmol/L 3.8 4.0 4.3  Chloride 101 - 111 mmol/L 100(L) 102 102  CO2 22 - 32 mmol/L '26 28 26  '$ Calcium 8.9 - 10.3 mg/dL 10.2 9.6 9.8   Hepatic Function Latest Ref Rng & Units 11/06/2017 04/07/2017 08/21/2016  Total Protein 6.0 - 8.3 g/dL 7.1 7.0 7.4  Albumin 3.5 - 5.2 g/dL 4.3 4.0 4.2  AST 0 - 37 U/L '22 21 22  '$ ALT 0 - 35 U/L '19 20 17  '$ Alk Phosphatase 39 - 117 U/L 66 76 71  Total Bilirubin 0.2 - 1.2 mg/dL 0.5 0.6 0.5  Bilirubin, Direct 0.0 - 0.3 mg/dL 0.1 - 0.1   CBC Latest Ref Rng & Units 11/20/2017 11/19/2017 11/16/2017  WBC 4.0 - 10.5 K/uL 6.2 5.7 11.0(H)  Hemoglobin 12.0 - 15.0 g/dL 14.9 13.5 12.2  Hematocrit 36.0 - 46.0 % 45.7 40.7 37.4  Platelets 150 - 400 K/uL 259 271 180   Lab Results  Component Value Date   MCV 88.6 11/20/2017   MCV 89.3 11/19/2017   MCV 88.6 11/16/2017   Lab Results  Component Value Date   TSH 1.43 11/06/2017   No results found for: HGBA1C  Lipid Panel     Component Value Date/Time   CHOL 143 11/06/2017 1113   TRIG 111.0 11/06/2017 1113   HDL 57.60 11/06/2017 1113   CHOLHDL 2 11/06/2017 1113   VLDL 22.2 11/06/2017 1113   LDLCALC 63 11/06/2017 1113    RADIOLOGY: No results found.  IMPRESSION:  1. Atrial flutter, unspecified type ([place])   2. Essential hypertension   3. Atherosclerosis of native coronary artery of native heart without angina pectoris   4. Hyperlipidemia LDL goal <70  5. Anticoagulated     ASSESSMENT AND PLAN: Barbara [name] is a 77 year old African-American female who is status post stenting of a high-grade proximal LAD stenosis with insertion of an S670 3.5x18 mm bare-metal stent placed in 2000.  She has a history of hypertension and has been on amlodipine, losartan HCT 100/25 mg and it previously  had a prescription for furosemide which apparently has expired.  She had had issues with previous leg edema , which improved with reduction of her amlodipine.  She is now off amlodipine.  She had recently developed atrial flutter with variable block associated with increasing shortness of breath.  She was recently loaded with Tikosyn and converted to sinus rhythm.  She is maintaining sinus rhythm today.  QTc interval is normal.  She tires easily and admits to frequent awakening.  She has grade 2 diastolic dysfunction.  I reviewed recent laboratory from January 2019.  Lipid studies were excellent with an LDL of 63 and she continues to be on rosuvastatin 10 mg daily.  Her blood pressure today is stable on losartan 50 mg, furosemide 40 mg, and she is on Tikosyn 125 g twice a day and maintaining sinus rhythm.  She is anticoagulated with eliquis 5 mg twice a day and is not having any bleeding.  I reviewed her echo Doppler findings with her in detail.  She has hyperdynamic LV function with grade 2 diastolic dysfunction and had moderate pulmonary hypertension with biatrial enlargement.  There also was a suggestion of a mildly increased subvalvular outflow tract gradient to her hyperdynamic LV contraction and increased stroke volume.  I will see her as previously scheduled in several months.  Time spent: 25 minutes Troy Sine, MD, [facility]  12/05/2017 5:14 PM

## 2017-12-03 NOTE — Patient Instructions (Signed)
Medication Instructions:  Your physician recommends that you continue on your current medications as directed. Please refer to the Current Medication list given to you today.  Follow-Up: March as scheduled.  Any Other Special Instructions Will Be Listed Below (If Applicable).     If you need a refill on your cardiac medications before your next appointment, please call your pharmacy.

## 2017-12-05 ENCOUNTER — Ambulatory Visit
Admission: RE | Admit: 2017-12-05 | Discharge: 2017-12-05 | Disposition: A | Discharge status: Home / Self Care | Payer: Medicare Other | Source: Ambulatory Visit | Attending: Family Medicine | Admitting: Family Medicine

## 2017-12-05 ENCOUNTER — Encounter: Payer: Self-pay | Admitting: Cardiovascular Disease

## 2017-12-05 DIAGNOSIS — R1013 Epigastric pain: Secondary | ICD-10-CM

## 2017-12-10 ENCOUNTER — Telehealth: Payer: Self-pay | Admitting: Cardiovascular Disease

## 2017-12-10 ENCOUNTER — Ambulatory Visit (HOSPITAL_COMMUNITY)
Admission: RE | Admit: 2017-12-10 | Discharge: 2017-12-10 | Disposition: A | Discharge status: Home / Self Care | Payer: Medicare Other | Source: Ambulatory Visit | Attending: Nurse Practitioner | Admitting: Nurse Practitioner

## 2017-12-10 ENCOUNTER — Other Ambulatory Visit: Payer: Self-pay

## 2017-12-10 DIAGNOSIS — I10 Essential (primary) hypertension: Secondary | ICD-10-CM | POA: Insufficient documentation

## 2017-12-10 LAB — BASIC METABOLIC PANEL
Anion gap: 11 (ref 5–15)
BUN: 15 mg/dL (ref 6–20)
CO2: 26 mmol/L (ref 22–32)
Calcium: 10.2 mg/dL (ref 8.9–10.3)
Chloride: 102 mmol/L (ref 101–111)
Creatinine, Ser: 0.87 mg/dL (ref 0.44–1.00)
GFR calc Af Amer: >60 mL/min (ref >60)
GFR calc non Af Amer: >60 mL/min (ref >60)
Glucose, Bld: 97 mg/dL (ref 65–99)
Potassium: 4.1 mmol/L (ref 3.5–5.1)
Sodium: 139 mmol/L (ref 135–145)

## 2017-12-10 NOTE — Telephone Encounter (Signed)
Returned the call to the patient. She stated that the insurance company will require a prior authorization for the eliquis. Samples were offered but she stated she currently had some. Message routed to the provider's nurse.

## 2017-12-10 NOTE — Telephone Encounter (Signed)
Barbara [name] is calling because her insurance company is not going to covered her Eliquis .   Please call   Thanks

## 2017-12-15 NOTE — Telephone Encounter (Signed)
PA submitted 2/8  Approved effective 11/12/17-12/12/18 Case ID# [id number]

## 2017-12-22 ENCOUNTER — Encounter: Payer: Self-pay | Admitting: Internal Medicine

## 2017-12-22 ENCOUNTER — Ambulatory Visit (INDEPENDENT_AMBULATORY_CARE_PROVIDER_SITE_OTHER): Payer: Medicare Other | Admitting: Internal Medicine

## 2017-12-22 VITALS — BP 132/62 | HR 53 | Ht 66.5 in | Wt 159.0 lb

## 2017-12-22 DIAGNOSIS — I481 Persistent atrial fibrillation: Secondary | ICD-10-CM

## 2017-12-22 DIAGNOSIS — I5032 Chronic diastolic (congestive) heart failure: Secondary | ICD-10-CM

## 2017-12-22 DIAGNOSIS — R490 Dysphonia: Secondary | ICD-10-CM | POA: Diagnosis not present

## 2017-12-22 DIAGNOSIS — Z79899 Other long term (current) drug therapy: Secondary | ICD-10-CM | POA: Diagnosis not present

## 2017-12-22 DIAGNOSIS — I4819 Other persistent atrial fibrillation: Secondary | ICD-10-CM

## 2017-12-22 NOTE — Patient Instructions (Addendum)
Medication Instructions:  Your physician recommends that you continue on your current medications as directed. Please refer to the Current Medication list given to you today.  Labwork: None ordered.  Testing/Procedures: None ordered.  Follow-Up:  Your physician wants you to follow-up in: 3 months with Roderic Palau, NP at the [place] clinic.  Any Other Special Instructions Will Be Listed Below (If Applicable).  Referral to Dr. Redmond Baseman ENT for hoarseness.  If you need a refill on your cardiac medications before your next appointment, please call your pharmacy.

## 2017-12-22 NOTE — Progress Notes (Signed)
PCP: Laurey Morale, MD Primary Cardiologist: Dr Claiborne Billings Primary EP: Dr Sheela Stack Barbara [name] is a 77 y.o. female who presents today for routine electrophysiology followup.  Since last being seen in afib clinic, the patient reports doing very well.  Today, she denies symptoms of palpitations, chest pain, shortness of breath,  lower extremity edema, dizziness, presyncope, or syncope.  She is hoarse today.  She states that she has had hoarseness since being hospitalized over a month ago.  She also has occasional trouble swallowing.  The patient is otherwise without complaint today.   Past Medical History:  Diagnosis Date  . A-fib ([place])   . Allergy   . CAD (coronary artery disease)    sees Dr. Shelva Majestic  cardiac stents - 2000  . Colon polyps   . Heart murmur   . History of stress test    show normal perfusion without scar or ischemia, post EF 68%  . Hx of echocardiogram    show an EF 55%-60% range with grade 1 diastolic dysfunction, she had mitral anular calcification with mild MR, moderate LA dilation and mild pulmonary hypertension with a PA estimated pressure of 102mm  . Hyperlipidemia   . Hypertension   . Osteoarthritis   . Pneumonia    hx of 2015   . PONV (postoperative nausea and vomiting)    Past Surgical History:  Procedure Laterality Date  . ABDOMINAL HYSTERECTOMY  1971  . COLONOSCOPY  01-05-14   per Dr. Teena Irani, clear, no repeats needed   . CORONARY STENT PLACEMENT  2000   in LAD  . KNEE ARTHROSCOPY Left 01/06/2015   Procedure: LEFT KNEE ARTHROSCOPY, abrasion chondroplasty of the medial femerol condryl,medial and lateral menisectomy, microfracture , synovectomy of the suprpatellar pouch;  Surgeon: Latanya Maudlin, MD;  Location: [facility];  Service: Orthopedics;  Laterality: Left;    ROS- all systems are reviewed and negatives except as per HPI above  Current Outpatient Medications  Medication Sig Dispense Refill  . apixaban (ELIQUIS) 5 MG TABS tablet Take 1  tablet (5 mg total) by mouth 2 (two) times daily. 180 tablet 3  . Cholecalciferol (D3-1000) 1000 units capsule Take 1,000 Units by mouth daily.    . Cobalamine Combinations (B-12) 1000-400 MCG SUBL Place under the tongue as directed.    . desoximetasone (TOPICORT) 0.25 % cream APPLY 1 APPLICATION TOPICALLY 2 TIMES DAILY (Patient taking differently: APPLY 1 APPLICATION TOPICALLY 2 TIMES daily as needed) 60 g 5  . dextromethorphan (DELSYM) 30 MG/5ML liquid Take 15 mg by mouth as needed for cough. Reported on 05/22/2016    . dofetilide (TIKOSYN) 125 MCG capsule Take 1 capsule (125 mcg total) by mouth 2 (two) times daily. 180 capsule 2  . furosemide (LASIX) 40 MG tablet Take 1 tablet (40 mg total) by mouth daily. 30 tablet 3  . loratadine (CLARITIN) 10 MG tablet Take 10 mg by mouth daily as needed for allergies.     Marland Kitchen losartan (COZAAR) 50 MG tablet Take 1 tablet (50 mg total) by mouth daily. 30 tablet 3  . Multiple Vitamin (MULTIVITAMIN WITH MINERALS) TABS tablet Take 1 tablet by mouth daily.    . Omega-3 Fatty Acids (FISH OIL) 1200 MG CPDR Take 1 capsule by mouth daily.    . potassium chloride SA (K-DUR,KLOR-CON) 20 MEQ tablet Take 1 tablet (20 mEq total) by mouth daily. 30 tablet 3  . rosuvastatin (CRESTOR) 10 MG tablet Take 1 tablet (10 mg total) by mouth daily. (  Patient taking differently: Take 10 mg by mouth at bedtime. ) 90 tablet 3  . triamcinolone (NASACORT) 55 MCG/ACT nasal inhaler Place 1 spray into both nostrils daily as needed (Allergies).      No current facility-administered medications for this visit.     Physical Exam: Vitals:   12/22/17 0928  BP: 132/62  Pulse: (!) 53  Weight: 159 lb (72.1 kg)  Height: 5' 6.5" (1.689 m)    GEN- The patient is well appearing, alert and oriented x 3 today.  + hoarse when talking Head- normocephalic, atraumatic Eyes-  Sclera clear, conjunctiva pink Ears- hearing intact Oropharynx- clear Lungs- Clear to ausculation bilaterally, normal work of  breathing Heart- Regular rate and rhythm, no murmurs, rubs or gallops, PMI not laterally displaced GI- soft, NT, ND, + BS Extremities- no clubbing, cyanosis, or edema  EKG tracing ordered today is personally reviewed and shows sinus rhythm 52 bpm, PR 168 msec, Qtc 412 msec, LVH with diffuse TWI  Labs 12/10/17 reviewed  Assessment and Plan:  1. Persistent atrial fibrillation and atrial flutter Doing very well with tikosyn Qt is stable Recent labs reviewed  2. chronic diastolic dysfunction Stable No change required today  3. CAD No ischemic symptoms  4. HTN Stable No change required today  5. hoarsenss She is hoarse today.  She states that she has had hoarseness since being hospitalized over a month ago.  She also has occasional trouble swallowing.  I will refer to Dr Redmond Baseman with ENT for further evaluation and management.  Follow-up in AF clinic every 3 months Follow-up with Dr Claiborne Billings as scheduled I will see when needed  Thompson Grayer MD, [facility] 12/22/2017 9:37 AM

## 2017-12-29 ENCOUNTER — Telehealth: Payer: Self-pay | Admitting: Internal Medicine

## 2017-12-29 DIAGNOSIS — J3801 Paralysis of vocal cords and larynx, unilateral: Secondary | ICD-10-CM | POA: Insufficient documentation

## 2017-12-29 DIAGNOSIS — R490 Dysphonia: Secondary | ICD-10-CM | POA: Insufficient documentation

## 2017-12-29 NOTE — Telephone Encounter (Signed)
Walk In pt form-[employer] paperwork dropped off. Sent interoffice to [place].

## 2018-01-06 ENCOUNTER — Other Ambulatory Visit: Payer: Self-pay | Admitting: Otolaryngology

## 2018-01-06 DIAGNOSIS — J3801 Paralysis of vocal cords and larynx, unilateral: Secondary | ICD-10-CM

## 2018-01-06 DIAGNOSIS — M25562 Pain in left knee: Secondary | ICD-10-CM | POA: Insufficient documentation

## 2018-01-06 DIAGNOSIS — R490 Dysphonia: Secondary | ICD-10-CM

## 2018-01-06 DIAGNOSIS — R498 Other voice and resonance disorders: Secondary | ICD-10-CM

## 2018-01-12 ENCOUNTER — Telehealth: Payer: Self-pay | Admitting: Cardiovascular Disease

## 2018-01-12 NOTE — Telephone Encounter (Signed)
Pt c/o medication issue: 1. Name of Medication:  Losartan 2. How are you currently taking this medication (dosage and times per day)? 3. Are you having a reaction (difficulty breathing--STAT)?  4. What is your medication issue?

## 2018-01-12 NOTE — Telephone Encounter (Signed)
Returned the call to the patient. She stated that she got a call from [place] that her Losartan has been recalled and she now needs a replacement.

## 2018-01-13 ENCOUNTER — Ambulatory Visit (INDEPENDENT_AMBULATORY_CARE_PROVIDER_SITE_OTHER): Payer: Medicare Other | Admitting: Cardiovascular Disease

## 2018-01-13 ENCOUNTER — Encounter: Payer: Self-pay | Admitting: Cardiovascular Disease

## 2018-01-13 VITALS — BP 136/58 | HR 53 | Ht 67.0 in | Wt 137.0 lb

## 2018-01-13 DIAGNOSIS — Z79899 Other long term (current) drug therapy: Secondary | ICD-10-CM

## 2018-01-13 DIAGNOSIS — R490 Dysphonia: Secondary | ICD-10-CM

## 2018-01-13 DIAGNOSIS — I5032 Chronic diastolic (congestive) heart failure: Secondary | ICD-10-CM | POA: Diagnosis not present

## 2018-01-13 DIAGNOSIS — E785 Hyperlipidemia, unspecified: Secondary | ICD-10-CM

## 2018-01-13 DIAGNOSIS — I4892 Unspecified atrial flutter: Secondary | ICD-10-CM

## 2018-01-13 DIAGNOSIS — I10 Essential (primary) hypertension: Secondary | ICD-10-CM

## 2018-01-13 DIAGNOSIS — Z7901 Long term (current) use of anticoagulants: Secondary | ICD-10-CM

## 2018-01-13 MED ORDER — APIXABAN 5 MG PO TABS: 5.0000 mg | ORAL_TABLET | Freq: Two times a day (BID) | ORAL | 0 refills | Status: DC

## 2018-01-13 MED ORDER — CANDESARTAN CILEXETIL 8 MG PO TABS: 8.0000 mg | ORAL_TABLET | Freq: Every day | ORAL | 3 refills | Status: DC

## 2018-01-13 NOTE — Telephone Encounter (Signed)
Patient chart indicates CHF.  Switch to candesartan 8 mg daily and have her check home BP if able.

## 2018-01-13 NOTE — Patient Instructions (Addendum)
Medication Instructions:  DECREASE potassium to 20 meq daily  STOP Losartan   START Candesartan 8 mg daily  Labs: BMET in 2 weeks  Follow-Up: Your physician wants you to follow-up in: 6 months with Dr. Claiborne Billings. You will receive a reminder letter in the mail two months in advance. If you don't receive a letter, please call our office to schedule the follow-up appointment.   Any Other Special Instructions Will Be Listed Below (If Applicable).     If you need a refill on your cardiac medications before your next appointment, please call your pharmacy.

## 2018-01-13 NOTE — Telephone Encounter (Signed)
Patient was seen today for an appointment. Medication was changed.

## 2018-01-13 NOTE — Progress Notes (Signed)
Patient ID: Barbara [name], female   DOB: Nov 03, 1941, 77 y.o.   MRN: [id number]    Primary MD: Dr. Alysia Penna  HPI: Barbara [name] is a 77 y.o. female who presents for a 7 month follow-up cardiology evaluation.  I last saw her on 11/10/2017 as an add-on  after she called the office complaining that her heart rate was irregular and episodically fast..  She presents for follow-up evaluation.  Barbara [name] has CAD and in November 2000 underwent stenting of a high-grade LAD stenosis with an [id number] 3.518 mm bare-metal stent. A nuclear perfusion study in November 2013 was unchanged from previously and continued to show normal perfusion without scar or ischemia. Ejection fraction was 68%. An echo Doppler study revealed an ejection fraction in the 55-60% range with grade 1 diastolic dysfunction. 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  Additional problems include hypertension as well as hyperlipidemia. Over the past several months she has noticed that she is more tired.  She also has noticed more shortness of breath with activity.  She has noted some vague indigestion symptoms.  She admits to some mild ankle swelling.  She has been taking Crestor 10 mg and denies myalgias.  She has been on losartan HCT 100/25 as well as amlodipine 10 mg and Lasix 20 mg for blood pressure and peripheral edema.  When I saw her in November.  Her blood pressure was controlled.  She was bradycardic not on any rate control medication and I raise the possibility of a component of chronotropic incompetence.  To further evaluate her exertional dyspnea.  She underwent a nuclear perfusion study on 10/06/2014 which was normal.  Post stress ejection fraction was 67%.  An echo Doppler study done on 10/06/2014 showed an EF of 60-65% with moderate left ventricular hypertrophy.  There was grade 1 diastolic dysfunction.  She had  indeterminate LV filling pressure.  There was mild aortic sclerosis without stenosis, mitral annular calcification with mild MR, and mild dilatation of her left atrium with mild tricuspid regurgitation.  Pulmonary pressures were minimally elevated at 31 mm.  She underwent successful left knee surgery by Dr. Lindwood Qua in March 2016.  She tolerated surgery well from a cardiovascular standpoint.  An echo Doppler study in March 2017 showed mild LVH with vigorous LV function with an EF of 65-70%.  There was grade 2 diastolic dysfunction.  There was moderate aortic sclerosis without stenosis.  There was mild LA dilation.  PA pressures were upper normal.  When I  saw her in May 2018, she had noticed issues with ankle swelling.  She had stopped taking furosemide since his had expired.  I elected to institute spironolactone with her moderate diastolic dysfunction.  She was taking amlodipine 10 mg, losartan HCT 100/25 mg and she has been on spironolactone 25 mg daily.    When I  saw her in August 2018 she was in sinus rhythm and had bilateral ankle and feet swelling.  I reduced her amlodipine to 5 mg and further titrated spironolactone to 25 mg twice a day.  Recently she has been feeling well.  She went to [place] over Christmas and did well.  However, since 11/05/2017 she noticed her heart rate being a little faster and subsequently felt some irregularity.  I saw her in the office on 11/10/2017 and she was in atrial flutter with variable block.  Her blood pressure was elevated.  I started metoprolol 25 mg twice a day and started eliquis 5 mg twice a day.  Subsequently, an echo Doppler study on November 17, 2017 showed an EF of 70-75% with grade 2 diastolic dysfunction.  There was mild to moderate MR, moderately severe LA enlargement, and PA pressure was increased at 53 mm.  She was hospitalized and underwent Tikosyn loading with Dr. Rayann Heman, which ultimately pharmacologically cardioverted her back to sinus rhythm.   She was seen in follow-up in the atrial fibrillation clinic on 12/01/2017.  At that time, she was maintaining sinus rhythm and her QT interval was stable.   I last saw her On December 03, 2017 at which time she was doing well.  Subsequently she has seen Dr. Rayann Heman.  Has had issues with hoarseness and was referred to Dr. Redmond Baseman for ENT evaluation.  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  She is scheduled to undergo a CT of the soft tissue of her neck with contrast on January 20, 2018.  She was recently notified that her valsartan has a contaminant.  She denies any awareness of recurrent atrial fibrillation.  She denies significant swelling.  She has been maintained on furosemide 40 mg daily, losartan 50 mg for hypertension.  She denies bleeding on Eliquis 5 mg twice daily.  She continues to be on rosuvastatin 10 mg daily for hyperlipidemia.  She is on Tikosyn 125 mcg twice a day.  She presents for reevaluation.   Past Medical History:  Diagnosis Date  . A-fib ([place])   . Allergy   . CAD (coronary artery disease)    sees Dr. Shelva Majestic  cardiac stents - 2000  . Colon polyps   . Heart murmur   . History of stress test    show normal perfusion without scar or ischemia, post EF 68%  . Hx of echocardiogram    show an EF 55%-60% range with grade 1 diastolic dysfunction, she had mitral anular calcification with mild MR, moderate LA dilation and mild pulmonary hypertension with a PA estimated pressure of 66m  . Hyperlipidemia   . Hypertension   . Osteoarthritis   . Pneumonia    hx of 2015   . PONV (postoperative nausea and vomiting)     Past Surgical History:  Procedure Laterality Date  . ABDOMINAL HYSTERECTOMY  1971  . COLONOSCOPY  01-05-14   per Dr. JTeena Irani clear, no repeats needed   . CORONARY STENT PLACEMENT  2000   in LAD  . KNEE ARTHROSCOPY Left 01/06/2015   Procedure: LEFT KNEE ARTHROSCOPY, abrasion chondroplasty of the medial femerol condryl,medial and lateral menisectomy,  microfracture , synovectomy of the suprpatellar pouch;  Surgeon: RLatanya Maudlin MD;  Location: [facility];  Service: Orthopedics;  Laterality: Left;    Allergies  Allergen Reactions  . Lidocaine Anaphylaxis  . Morphine Other (See Comments)    Body shuts down  . Tape Itching  . Procaine Hcl Rash    Rash. Anything with caine in it   . Sulfonamide Derivatives Hives  . Vytorin [Ezetimibe-Simvastatin] Other (See Comments)    Pt unsure of allergy     Current Outpatient Medications  Medication Sig Dispense Refill  . apixaban (ELIQUIS) 5 MG TABS tablet Take 1 tablet (5 mg total) by mouth 2 (two) times daily. 60 tablet 0  . Cholecalciferol (D3-1000) 1000 units capsule Take 1,000 Units by mouth daily.    . Cobalamine Combinations (B-12) 1000-400 MCG SUBL Place under the tongue as directed.    .Marland Kitchen  desoximetasone (TOPICORT) 0.25 % cream APPLY 1 APPLICATION TOPICALLY 2 TIMES DAILY (Patient taking differently: APPLY 1 APPLICATION TOPICALLY 2 TIMES daily as needed) 60 g 5  . dextromethorphan (DELSYM) 30 MG/5ML liquid Take 15 mg by mouth as needed for cough. Reported on 05/22/2016    . dofetilide (TIKOSYN) 125 MCG capsule Take 1 capsule (125 mcg total) by mouth 2 (two) times daily. 180 capsule 2  . furosemide (LASIX) 40 MG tablet Take 1 tablet (40 mg total) by mouth daily. 30 tablet 3  . loratadine (CLARITIN) 10 MG tablet Take 10 mg by mouth daily as needed for allergies.     . Multiple Vitamin (MULTIVITAMIN WITH MINERALS) TABS tablet Take 1 tablet by mouth daily.    . Omega-3 Fatty Acids (FISH OIL) 1200 MG CPDR Take 1 capsule by mouth daily.    . potassium chloride SA (KLOR-CON M20) 20 MEQ tablet Take 20 mEq by mouth daily.    . rosuvastatin (CRESTOR) 10 MG tablet Take 1 tablet (10 mg total) by mouth daily. (Patient taking differently: Take 10 mg by mouth at bedtime. ) 90 tablet 3  . triamcinolone (NASACORT) 55 MCG/ACT nasal inhaler Place 1 spray into both nostrils daily as needed (Allergies).     .  candesartan (ATACAND) 8 MG tablet Take 1 tablet (8 mg total) by mouth daily. 90 tablet 3   No current facility-administered medications for this visit.     Social History   Socioeconomic History  . Marital status: Married    Spouse name: Not on file  . Number of children: Not on file  . Years of education: Not on file  . Highest education level: Not on file  Social Needs  . Financial resource strain: Not on file  . Food insecurity - worry: Not on file  . Food insecurity - inability: Not on file  . Transportation needs - medical: Not on file  . Transportation needs - non-medical: Not on file  Occupational History  . Not on file  Tobacco Use  . Smoking status: Former Smoker    Last attempt to quit: 11/04/1996    Years since quitting: 21.2  . Smokeless tobacco: Never Used  Substance and Sexual Activity  . Alcohol use: No    Alcohol/week: 0.0 oz  . Drug use: No  . Sexual activity: Not on file  Other Topics Concern  . Not on file  Social History Narrative  . Not on file    Family History  Problem Relation Age of Onset  . Cancer Maternal Grandmother   . Heart disease Maternal Grandfather     ROS General: Negative; No fevers, chills, or night sweats;  HEENT: Negative; No changes in vision or hearing, sinus congestion, difficulty swallowing Pulmonary: Negative; No cough, wheezing, hemoptysis Cardiovascular:  See HPI GI: Negative; No nausea, vomiting, diarrhea, or abdominal pain GU: Negative; No dysuria, hematuria, or difficulty voiding Musculoskeletal: history of plantar fasciitis; status post left knee surgery March 2016 no myalgias, joint pain, or weakness Hematologic/Oncology: Negative; no easy bruising, bleeding Endocrine: Negative; no heat/cold intolerance; no diabetes Neuro: Negative; no changes in balance, headaches Skin: Negative; No rashes or skin lesions Psychiatric: Negative; No behavioral problems, depression Sleep: Negative; No snoring, daytime sleepiness,  hypersomnolence, bruxism, restless legs, hypnogognic hallucinations, no cataplexy Other comprehensive 14 point system review is negative.   PE BP (!) 136/58   Pulse (!) 53   Ht _0  (1.702 m)   Wt 137 lb (62.1 kg)   BMI 21.46 kg/m  Repeat blood pressure by me was 138/60  Wt Readings from Last 3 Encounters:  01/13/18 137 lb (62.1 kg)  12/22/17 159 lb (72.1 kg)  12/03/17 158 lb (71.7 kg)    General: Alert, oriented, no distress.  Skin: normal turgor, no rashes, warm and dry HEENT: Normocephalic, atraumatic. Pupils equal round and reactive to light; sclera anicteric; extraocular muscles intact;  Nose without nasal septal hypertrophy Mouth/Parynx benign; Mallinpatti scale 3 Neck: No JVD, no carotid bruits; normal carotid upstroke; no palpable masses Lungs: clear to ausculatation and percussion; no wheezing or rales Chest wall: without tenderness to palpitation Heart: PMI not displaced, RRR, s1 s2 normal, 1/6 systolic murmur, no diastolic murmur, no rubs, gallops, thrills, or heaves Abdomen: soft, nontender; no hepatosplenomehaly, BS+; abdominal aorta nontender and not dilated by palpation. Back: no CVA tenderness Pulses 2+ Musculoskeletal: full range of motion, normal strength, no joint deformities Extremities: no clubbing cyanosis or edema, Homan's sign negative  Neurologic: grossly nonfocal; Cranial nerves grossly wnl Psychologic: Normal mood and affect  ECG (independently read by me): Sinus Bradycardia 53 bpm.  LVH by voltage with repolarization changes.  QTc interval 414 ms.  PR interval  164 ms.  01/30/20192019 ECG (independently read by me): normal sinus rhythm at 64 bpm.  LVH with repolarization changes.  QTc interval 410 ms.  PR interval 174 ms.  11/10/2017 ECG (independently read by me): Atrial flutter with variable block and average heart rate at 65 bpm.  LVH with repolarization changes.  August 2018 ECG (independently read by me): Sinus bradycardia with PAC.  LVH  with repolarization changes.  May 2018 ECG (independently read by me): Sinus bradycardia 50 bpm.  LVH with repolarization changes in leads II, III, and F, 1, V3 through V6.  February 2017 ECG (independently read by me): Probable low atrial rhythm with a ventricular rate at 49 bpm.  Previous seen noted diffuse T-wave abnormality in leads II, III, and F, V3 through V6, and lead 1.  February 2016 ECG (independently read by me): Marked sinus bradycardia at 45 bpm with LVH and diffuse T-wave inversion II, III, and F lead 1, and V3 through V6; minimally changed from one year previously.   November 2014 ECG: Sinus bradycardia with sinus arrhythmia. Previously noted LVH with marked repolarization T-wave abnormalities which have been present previously.  LABS: BMP Latest Ref Rng & Units 12/10/2017 12/01/2017 11/24/2017  Glucose 65 - 99 mg/dL 97 99 95  BUN 6 - 20 mg/dL 15 17 21(H)  Creatinine 0.44 - 1.00 mg/dL 0.87 0.95 0.88  BUN/Creat Ratio 12 - 28 - - -  Sodium 135 - 145 mmol/L 139 138 139  Potassium 3.5 - 5.1 mmol/L 4.1 3.8 4.0  Chloride 101 - 111 mmol/L 102 100(L) 102  CO2 22 - 32 mmol/L _0 Calcium 8.9 - 10.3 mg/dL 10.2 10.2 9.6   Hepatic Function Latest Ref Rng & Units 11/06/2017 04/07/2017 08/21/2016  Total Protein 6.0 - 8.3 g/dL 7.1 7.0 7.4  Albumin 3.5 - 5.2 g/dL 4.3 4.0 4.2  AST 0 - 37 U/L _1 ALT 0 - 35 U/L _2 Alk Phosphatase 39 - 117 U/L 66 76 71  Total Bilirubin 0.2 - 1.2 mg/dL 0.5 0.6 0.5  Bilirubin, Direct 0.0 - 0.3 mg/dL 0.1 - 0.1   CBC Latest Ref Rng & Units 11/20/2017 11/19/2017 11/16/2017  WBC 4.0 - 10.5 K/uL 6.2 5.7 11.0(H)  Hemoglobin 12.0 - 15.0 g/dL 14.9 13.5 12.2  Hematocrit 36.0 -  46.0 % 45.7 40.7 37.4  Platelets 150 - 400 K/uL 259 271 180   Lab Results  Component Value Date   MCV 88.6 11/20/2017   MCV 89.3 11/19/2017   MCV 88.6 11/16/2017   Lab Results  Component Value Date   TSH 1.43 11/06/2017   No results found for: HGBA1C  Lipid Panel       Component Value Date/Time   CHOL 143 11/06/2017 1113   TRIG 111.0 11/06/2017 1113   HDL 57.60 11/06/2017 1113   CHOLHDL 2 11/06/2017 1113   VLDL 22.2 11/06/2017 1113   LDLCALC 63 11/06/2017 1113    RADIOLOGY: No results found.  IMPRESSION:  1. Atrial flutter, unspecified type ([place]); maintaining sinus rhythm on Tikosyn   2. Essential hypertension   3. Medication management   4. Chronic diastolic heart failure ([place])   5. Anticoagulated   6. Hyperlipidemia LDL goal <70   7. Hoarseness     ASSESSMENT AND PLAN: Ms. Marxen is a 77 year old African-American female who is status post stenting of a high-grade proximal LAD stenosis with insertion of an S670 3.5x18 mm bare-metal stent placed in 2000.  She has a history of hypertension and has been on amlodipine, losartan HCT 100/25 mg She had had issues with previous leg edema , which improved with reduction of her amlodipine.  She is now off amlodipine.  She  developed atrial flutter with variable block associated with increasing shortness of breath.  She was recently loaded with Tikosyn and converted to sinus rhythm.  ECG today continues to show sinus rhythm with a normal QTc interval with a ventricular rate at 53 bpm.  She is maintaining sinus rhythm today.  QTc interval is normal.  I again reviewed her echo Doppler study which showed hyperdynamic LV function with grade 2 diastolic dysfunction and had moderate pulmonary hypertension with biatrial enlargement.  There also was a suggestion of a mildly increased subvalvular outflow tract gradient to her hyperdynamic LV contraction and increased stroke volume.  Her blood pressure today is upper normal.  She was recently notified that her losartan is involved with the contamination.  As result we will start her on candesartan 8 mg daily in its place and if necessary further titration to 60 mg will be done.  She continues to be on furosemide 40 mg daily.  There is no edema.  She is hoarse and is  scheduled to undergo a CT of the soft tissue of her neck for further evaluation ordered by Dr. Redmond Baseman.  She is on rosuvastatin 10 mg for hyperlipidemia with target LDL less than 70.  In January 2019 LDL was 63.  She had been on supplemental potassium to capsules daily.  I have suggested she reduce this down to 1 pill a day with changed to candesartan.  A repeat be met will be obtained in 2 weeks.  She is scheduled to undergo A. fib clinic evaluation in May.  I will see her in 6 months for follow-up evaluation.  Time spent: 25 minutes Troy Sine, MD, [facility]  01/13/2018 3:02 PM

## 2018-01-20 ENCOUNTER — Ambulatory Visit
Admission: RE | Admit: 2018-01-20 | Discharge: 2018-01-20 | Disposition: A | Discharge status: Home / Self Care | Payer: Medicare Other | Source: Ambulatory Visit | Attending: Otolaryngology | Admitting: Otolaryngology

## 2018-01-20 DIAGNOSIS — R498 Other voice and resonance disorders: Secondary | ICD-10-CM

## 2018-01-20 DIAGNOSIS — J3801 Paralysis of vocal cords and larynx, unilateral: Secondary | ICD-10-CM

## 2018-01-20 DIAGNOSIS — R490 Dysphonia: Secondary | ICD-10-CM

## 2018-01-20 MED ORDER — IOPAMIDOL (ISOVUE-300) INJECTION 61%
75.0000 mL | Freq: Once | INTRAVENOUS | Status: AC | PRN
  Administered 2018-01-20: 75 mL via INTRAVENOUS

## 2018-01-28 LAB — BASIC METABOLIC PANEL
BUN/Creatinine Ratio: 28 (ref 12–28)
BUN: 24 mg/dL (ref 8–27)
CO2: 28 mmol/L (ref 20–29)
Calcium: 10.5 mg/dL — ABNORMAL HIGH (ref 8.7–10.3)
Chloride: 99 mmol/L (ref 96–106)
Creatinine, Ser: 0.87 mg/dL (ref 0.57–1.00)
GFR calc Af Amer: 74 mL/min/{1.73_m2} (ref >59)
GFR calc non Af Amer: 64 mL/min/{1.73_m2} (ref >59)
Glucose: 92 mg/dL (ref 65–99)
Potassium: 4.1 mmol/L (ref 3.5–5.2)
Sodium: 144 mmol/L (ref 134–144)

## 2018-02-10 ENCOUNTER — Other Ambulatory Visit: Payer: Self-pay | Admitting: Otolaryngology

## 2018-02-12 ENCOUNTER — Encounter (HOSPITAL_COMMUNITY): Payer: Self-pay | Admitting: *Deleted

## 2018-02-12 ENCOUNTER — Telehealth: Payer: Self-pay | Admitting: Cardiovascular Disease

## 2018-02-12 ENCOUNTER — Other Ambulatory Visit: Payer: Self-pay

## 2018-02-12 NOTE — Progress Notes (Signed)
Mrs McLauglin reports that last dose of Eliquis was 02/10/18- am. I called Lattie Haw at Dr bates office and asked if they spoke with patient's cardiologist and got permission to stop Eliquis.  I received a message from Lattie Haw that said Dr Evette Georges office is faxing the ok to Dr Redmond Baseman office and they will fax to hospital.

## 2018-02-12 NOTE — Telephone Encounter (Signed)
Patient with diagnosis of ATRIAL FIBRILLATION on ELIQUIS for anticoagulation.    Procedure: LARYNGOSCOPY WITH RADIESSE INJECTION Date of procedure: 02/13/2018  CHADS2-VASc score of  6 (CHF, HTN, AGE X 2, CAD, female)  CrCl > 50 ML/MIN   Per office protocol, patient can hold ELIQUIS for 1 days prior to procedure.      **NOTES REQUEST RECEIVED ON 02/12/18 AT 4:37PM. PATIENT ALREADY INSTRUCTED TO HOLD ELIQUIS FOR 3 DAYS BY DR BATES OFFICE**   PLEASE RESUME ELIQUIS AS SOON AS POSSIBLE BY PROVIDER PERFORMING THE PROCEDURE.   Barbara [name] PharmD, BCPS, [place] [address] 02/12/2018 5:23 PM

## 2018-02-12 NOTE — Telephone Encounter (Signed)
New message        [facility] Pre-operative Risk Assessment    Request for surgical clearance:  1. What type of surgery is being performed?   Vocal cord injection ( like botox)  2. When is this surgery scheduled?  02/13/18  3. What type of clearance is required (medical clearance vs. Pharmacy clearance to hold med vs. Both)? pharmacy  4. Are there any medications that need to be held prior to surgery and how long?  Coumadin 3 days -resume Saturday  5. Practice name and name of physician performing surgery?  Dr Redmond Baseman   6. What is your office phone number [phone]   7.   What is your office fax number fax [phone]  8.   Anesthesia type (None, local, MAC, general) ?  General    Barbara [name] 02/12/2018, 4:37 PM  _________________________________________________________________   (provider comments below)

## 2018-02-13 ENCOUNTER — Encounter (HOSPITAL_COMMUNITY)
Admission: RE | Disposition: A | Discharge status: Home / Self Care | Payer: Self-pay | Source: Ambulatory Visit | Attending: Otolaryngology

## 2018-02-13 ENCOUNTER — Ambulatory Visit (HOSPITAL_COMMUNITY): Payer: Medicare Other | Admitting: Anesthesiology

## 2018-02-13 ENCOUNTER — Ambulatory Visit (HOSPITAL_COMMUNITY)
Admission: RE | Admit: 2018-02-13 | Discharge: 2018-02-13 | Disposition: A | Discharge status: Home / Self Care | Payer: Medicare Other | Source: Ambulatory Visit | Attending: Otolaryngology | Admitting: Otolaryngology

## 2018-02-13 ENCOUNTER — Encounter (HOSPITAL_COMMUNITY): Payer: Self-pay | Admitting: Surgery

## 2018-02-13 DIAGNOSIS — Z79899 Other long term (current) drug therapy: Secondary | ICD-10-CM | POA: Insufficient documentation

## 2018-02-13 DIAGNOSIS — K219 Gastro-esophageal reflux disease without esophagitis: Secondary | ICD-10-CM | POA: Diagnosis not present

## 2018-02-13 DIAGNOSIS — R490 Dysphonia: Secondary | ICD-10-CM | POA: Insufficient documentation

## 2018-02-13 DIAGNOSIS — Z8249 Family history of ischemic heart disease and other diseases of the circulatory system: Secondary | ICD-10-CM | POA: Insufficient documentation

## 2018-02-13 DIAGNOSIS — I4891 Unspecified atrial fibrillation: Secondary | ICD-10-CM | POA: Insufficient documentation

## 2018-02-13 DIAGNOSIS — I110 Hypertensive heart disease with heart failure: Secondary | ICD-10-CM | POA: Diagnosis not present

## 2018-02-13 DIAGNOSIS — I272 Pulmonary hypertension, unspecified: Secondary | ICD-10-CM | POA: Insufficient documentation

## 2018-02-13 DIAGNOSIS — Z885 Allergy status to narcotic agent status: Secondary | ICD-10-CM | POA: Insufficient documentation

## 2018-02-13 DIAGNOSIS — Z882 Allergy status to sulfonamides status: Secondary | ICD-10-CM | POA: Diagnosis not present

## 2018-02-13 DIAGNOSIS — R011 Cardiac murmur, unspecified: Secondary | ICD-10-CM | POA: Insufficient documentation

## 2018-02-13 DIAGNOSIS — I509 Heart failure, unspecified: Secondary | ICD-10-CM | POA: Insufficient documentation

## 2018-02-13 DIAGNOSIS — Z955 Presence of coronary angioplasty implant and graft: Secondary | ICD-10-CM | POA: Insufficient documentation

## 2018-02-13 DIAGNOSIS — M1990 Unspecified osteoarthritis, unspecified site: Secondary | ICD-10-CM | POA: Insufficient documentation

## 2018-02-13 DIAGNOSIS — E785 Hyperlipidemia, unspecified: Secondary | ICD-10-CM | POA: Diagnosis not present

## 2018-02-13 DIAGNOSIS — Z888 Allergy status to other drugs, medicaments and biological substances status: Secondary | ICD-10-CM | POA: Insufficient documentation

## 2018-02-13 DIAGNOSIS — J3801 Paralysis of vocal cords and larynx, unilateral: Secondary | ICD-10-CM | POA: Diagnosis not present

## 2018-02-13 DIAGNOSIS — I2510 Atherosclerotic heart disease of native coronary artery without angina pectoris: Secondary | ICD-10-CM | POA: Diagnosis not present

## 2018-02-13 DIAGNOSIS — Z7901 Long term (current) use of anticoagulants: Secondary | ICD-10-CM | POA: Insufficient documentation

## 2018-02-13 DIAGNOSIS — Z87891 Personal history of nicotine dependence: Secondary | ICD-10-CM | POA: Insufficient documentation

## 2018-02-13 HISTORY — DX: Heart failure, unspecified: I50.9

## 2018-02-13 HISTORY — DX: Gastro-esophageal reflux disease without esophagitis: K21.9

## 2018-02-13 HISTORY — DX: Dyspnea, unspecified: R06.00

## 2018-02-13 HISTORY — DX: Cardiac arrhythmia, unspecified: I49.9

## 2018-02-13 HISTORY — PX: DIRECT LARYNGOSCOPY WITH RADIAESSE INJECTION: SHX5328

## 2018-02-13 LAB — BASIC METABOLIC PANEL
Anion gap: 11 (ref 5–15)
BUN: 25 mg/dL — ABNORMAL HIGH (ref 6–20)
CO2: 26 mmol/L (ref 22–32)
Calcium: 9.9 mg/dL (ref 8.9–10.3)
Chloride: 101 mmol/L (ref 101–111)
Creatinine, Ser: 0.91 mg/dL (ref 0.44–1.00)
GFR calc Af Amer: >60 mL/min (ref >60)
GFR calc non Af Amer: 59 mL/min — ABNORMAL LOW (ref >60)
Glucose, Bld: 88 mg/dL (ref 65–99)
Potassium: 4 mmol/L (ref 3.5–5.1)
Sodium: 138 mmol/L (ref 135–145)

## 2018-02-13 LAB — PROTIME-INR
INR: 1.02
Prothrombin Time: 13.3 seconds (ref 11.4–15.2)

## 2018-02-13 LAB — HEMOGLOBIN: Hemoglobin: 14 g/dL (ref 12.0–15.0)

## 2018-02-13 SURGERY — LARYNGOSCOPY, DIRECT, WITH CALCIUM HYDROXYLAPATITE MICROSPHERE INJECTION
Anesthesia: General | Site: Throat

## 2018-02-13 MED ORDER — PROPOFOL 10 MG/ML IV BOLUS
INTRAVENOUS | Status: DC | PRN
  Administered 2018-02-13: 15 mg via INTRAVENOUS
  Administered 2018-02-13: 20 mg via INTRAVENOUS
  Administered 2018-02-13: 80 mg via INTRAVENOUS
  Administered 2018-02-13 (×2): 20 mg via INTRAVENOUS

## 2018-02-13 MED ORDER — SUGAMMADEX SODIUM 200 MG/2ML IV SOLN
INTRAVENOUS | Status: DC | PRN
  Administered 2018-02-13: 400 mg via INTRAVENOUS

## 2018-02-13 MED ORDER — PHENYLEPHRINE HCL 10 MG/ML IJ SOLN
INTRAVENOUS | Status: DC | PRN
  Administered 2018-02-13: 30 ug/min via INTRAVENOUS

## 2018-02-13 MED ORDER — PROMETHAZINE HCL 25 MG/ML IJ SOLN: 6.2500 mg | INTRAMUSCULAR | Status: DC | PRN

## 2018-02-13 MED ORDER — FENTANYL CITRATE (PF) 100 MCG/2ML IJ SOLN
25.0000 ug | INTRAMUSCULAR | Status: DC | PRN
Start: 2018-02-13 — End: 2018-02-13

## 2018-02-13 MED ORDER — PROPOFOL 10 MG/ML IV BOLUS
INTRAVENOUS | Status: AC
  Filled 2018-02-13: qty 20

## 2018-02-13 MED ORDER — FENTANYL CITRATE (PF) 250 MCG/5ML IJ SOLN
INTRAMUSCULAR | Status: AC
  Filled 2018-02-13: qty 5

## 2018-02-13 MED ORDER — STERILE WATER FOR IRRIGATION IR SOLN
Status: DC | PRN
  Administered 2018-02-13: 1000 mL

## 2018-02-13 MED ORDER — 0.9 % SODIUM CHLORIDE (POUR BTL) OPTIME
TOPICAL | Status: DC | PRN
  Administered 2018-02-13: 1000 mL

## 2018-02-13 MED ORDER — MEPERIDINE HCL 50 MG/ML IJ SOLN
6.2500 mg | INTRAMUSCULAR | Status: DC | PRN
  Administered 2018-02-13: 6.25 mg via INTRAVENOUS

## 2018-02-13 MED ORDER — ONDANSETRON HCL 4 MG/2ML IJ SOLN
INTRAMUSCULAR | Status: DC | PRN
  Administered 2018-02-13: 4 mg via INTRAVENOUS

## 2018-02-13 MED ORDER — PROPOFOL 500 MG/50ML IV EMUL
INTRAVENOUS | Status: DC | PRN
  Administered 2018-02-13: 150 ug/kg/min via INTRAVENOUS

## 2018-02-13 MED ORDER — FENTANYL CITRATE (PF) 100 MCG/2ML IJ SOLN
INTRAMUSCULAR | Status: DC | PRN
  Administered 2018-02-13: 50 ug via INTRAVENOUS

## 2018-02-13 MED ORDER — GLYCOPYRROLATE 0.2 MG/ML IJ SOLN
INTRAMUSCULAR | Status: DC | PRN
  Administered 2018-02-13: .2 mg via INTRAVENOUS

## 2018-02-13 MED ORDER — LACTATED RINGERS IV SOLN
INTRAVENOUS | Status: DC
  Administered 2018-02-13: 11:00:00 via INTRAVENOUS

## 2018-02-13 MED ORDER — MEPERIDINE HCL 50 MG/ML IJ SOLN
INTRAMUSCULAR | Status: AC
  Filled 2018-02-13: qty 1

## 2018-02-13 MED ORDER — ROCURONIUM BROMIDE 100 MG/10ML IV SOLN
INTRAVENOUS | Status: DC | PRN
  Administered 2018-02-13: 40 mg via INTRAVENOUS

## 2018-02-13 SURGICAL SUPPLY — 31 items
BALLN PULM 15 16.5 18 X 75CM (BALLOONS)
BALLN PULM 15 16.5 18X75 (BALLOONS)
BALLOON PULM 15 16.5 18X75 (BALLOONS) IMPLANT
CANISTER SUCT 3000ML PPV (MISCELLANEOUS) ×3 IMPLANT
CONT SPEC 4OZ CLIKSEAL STRL BL (MISCELLANEOUS) IMPLANT
COVER BACK TABLE 60X90IN (DRAPES) ×3 IMPLANT
COVER MAYO STAND STRL (DRAPES) ×3 IMPLANT
CRADLE DONUT ADULT HEAD (MISCELLANEOUS) IMPLANT
DRAPE HALF SHEET 40X57 (DRAPES) ×3 IMPLANT
GAUZE SPONGE 4X4 12PLY STRL (GAUZE/BANDAGES/DRESSINGS) IMPLANT
GAUZE SPONGE 4X4 16PLY XRAY LF (GAUZE/BANDAGES/DRESSINGS) ×3 IMPLANT
GLOVE BIO SURGEON STRL SZ7.5 (GLOVE) ×3 IMPLANT
GOWN STRL REUS W/ TWL LRG LVL3 (GOWN DISPOSABLE) IMPLANT
GOWN STRL REUS W/TWL LRG LVL3 (GOWN DISPOSABLE)
GUARD TEETH (MISCELLANEOUS) ×3 IMPLANT
KIT PROLARN PLUS GEL W/NDL (Prosthesis and Implant ENT) ×3 IMPLANT
KIT TURNOVER KIT B (KITS) ×3 IMPLANT
MARKER SKIN DUAL TIP RULER LAB (MISCELLANEOUS) IMPLANT
NDL HYPO 25GX1X1/2 BEV (NEEDLE) IMPLANT
NDL TRANS ORAL INJECTION (NEEDLE) ×1 IMPLANT
NEEDLE HYPO 25GX1X1/2 BEV (NEEDLE) IMPLANT
NEEDLE TRANS ORAL INJECTION (NEEDLE) ×3 IMPLANT
NS IRRIG 1000ML POUR BTL (IV SOLUTION) ×3 IMPLANT
PAD ARMBOARD 7.5X6 YLW CONV (MISCELLANEOUS) ×6 IMPLANT
PATTIES SURGICAL .5 X3 (DISPOSABLE) IMPLANT
SOLUTION ANTI FOG 6CC (MISCELLANEOUS) IMPLANT
SURGILUBE 2OZ TUBE FLIPTOP (MISCELLANEOUS) IMPLANT
TOWEL OR 17X24 6PK STRL BLUE (TOWEL DISPOSABLE) ×6 IMPLANT
TUBE CONNECTING 12'X1/4 (SUCTIONS) ×1
TUBE CONNECTING 12X1/4 (SUCTIONS) ×2 IMPLANT
WATER STERILE IRR 1000ML POUR (IV SOLUTION) IMPLANT

## 2018-02-13 NOTE — Brief Op Note (Signed)
02/13/2018  1:10 PM  PATIENT:  Barbara [name]  77 y.o. female  PRE-OPERATIVE DIAGNOSIS:  DYSPHONIA, PARESIS OF LEFT VOCAL CORD  POST-OPERATIVE DIAGNOSIS:  DYSPHONIA, PARESIS OF LEFT VOCAL CORD  PROCEDURE:  Procedure(s): DIRECT LARYNGOSCOPY WITH RADIAESSE INJECTION (N/A)  SURGEON:  Surgeon(s) and Role:    Melida Quitter, MD - Primary  PHYSICIAN ASSISTANT:   ASSISTANTS: none   ANESTHESIA:   general  EBL: None  BLOOD ADMINISTERED:none  DRAINS: none   LOCAL MEDICATIONS USED:  NONE  SPECIMEN:  No Specimen  DISPOSITION OF SPECIMEN:  N/A  COUNTS:  YES  TOURNIQUET:  * No tourniquets in log *  DICTATION: .Other Dictation: Dictation Number [id number]  PLAN OF CARE: Discharge to home after PACU  PATIENT DISPOSITION:  PACU - hemodynamically stable.   Delay start of Pharmacological VTE agent (>24hrs) due to surgical blood loss or risk of bleeding: no

## 2018-02-13 NOTE — Anesthesia Procedure Notes (Signed)
Procedure Name: General with mask airway Date/Time: 02/13/2018 12:59 PM Performed by: White, Amedeo Plenty, CRNA Pre-anesthesia Checklist: Patient identified, Emergency Drugs available, Suction available and Patient being monitored Patient Re-evaluated:Patient Re-evaluated prior to induction Oxygen Delivery Method: Circle system utilized Preoxygenation: Pre-oxygenation with 100% oxygen

## 2018-02-13 NOTE — Transfer of Care (Signed)
Immediate Anesthesia Transfer of Care Note  Patient: Barbara [name]  Procedure(s) Performed: DIRECT LARYNGOSCOPY WITH RADIAESSE INJECTION (N/A Throat)  Patient Location: PACU  Anesthesia Type:General  Level of Consciousness: awake, alert  and patient cooperative  Airway & Oxygen Therapy: Patient Spontanous Breathing  Post-op Assessment: Report given to RN and Post -op Vital signs reviewed and stable  Post vital signs: Reviewed and stable  Last Vitals:  Vitals Value Taken Time  BP 150/59 02/13/2018  1:21 PM  Temp    Pulse 63 02/13/2018  1:22 PM  Resp 13 02/13/2018  1:22 PM  SpO2 98 % 02/13/2018  1:22 PM  Vitals shown include unvalidated device data.  Last Pain:  Vitals:   02/13/18 1020  TempSrc:   PainSc: 0-No pain         Complications: No apparent anesthesia complications

## 2018-02-13 NOTE — H&P (Signed)
Barbara [name] is an 77 y.o. female.   Chief Complaint: Hoarseness, left vocal fold weakness HPI: 77 year old female with hoarseness since January and was found to have weakness of left vocal fold movement.  She presents for injection augmentation.  Past Medical History:  Diagnosis Date  . A-fib ([place])   . Allergy   . CAD (coronary artery disease)    sees Dr. Shelva Majestic  cardiac stents - 2000  . CHF (congestive heart failure) ([place])   . Colon polyps   . Dyspnea    02/12/18 " when my heart gets out of rhythm, it has not been out of rhythym- since I have been on Tikosyn (11/2017)  . Dysrhythmia    afib fib  . GERD (gastroesophageal reflux disease)    takes OTC- Omeprazole- prn  . Heart murmur   . History of stress test    show normal perfusion without scar or ischemia, post EF 68%  . Hx of echocardiogram    show an EF 55%-60% range with grade 1 diastolic dysfunction, she had mitral anular calcification with mild MR, moderate LA dilation and mild pulmonary hypertension with a PA estimated pressure of 1m  . Hyperlipidemia   . Hypertension   . Osteoarthritis   . Pneumonia    hx of 2015   . PONV (postoperative nausea and vomiting)     Past Surgical History:  Procedure Laterality Date  . CARDIAC CATHETERIZATION    . cardiac stents  2000  . COLONOSCOPY  01-05-14   per Dr. JTeena Irani clear, no repeats needed   . CORONARY STENT PLACEMENT  2000   in [facility]  . KNEE ARTHROSCOPY Left 01/06/2015   Procedure: LEFT KNEE ARTHROSCOPY, abrasion chondroplasty of the medial femerol condryl,medial and lateral menisectomy, microfracture , synovectomy of the suprpatellar pouch;  Surgeon: RLatanya Maudlin MD;  Location: [facility];  Service: Orthopedics;  Laterality: Left;  .Marland KitchenVAGINAL HYSTERECTOMY  1971    Family History  Problem Relation Age of Onset  . Cancer Maternal Grandmother   . Heart disease Maternal Grandfather    Social History:  reports that she quit smoking about 21 years ago. She quit  after 40.00 years of use. She has never used smokeless tobacco. She reports that she does not drink alcohol or use drugs.  Allergies:  Allergies  Allergen Reactions  . Lidocaine Anaphylaxis  . Morphine Other (See Comments)    Body shuts down  . Procaine Hcl Rash and Other (See Comments)    Anything with caine in it   . Sulfonamide Derivatives Hives  . Tape Itching  . Vytorin [Ezetimibe-Simvastatin] Other (See Comments)    Pt unsure of allergy     Medications Prior to Admission  Medication Sig Dispense Refill  . apixaban (ELIQUIS) 5 MG TABS tablet Take 1 tablet (5 mg total) by mouth 2 (two) times daily. 60 tablet 0  . candesartan (ATACAND) 8 MG tablet Take 1 tablet (8 mg total) by mouth daily. 90 tablet 3  . Cholecalciferol (D3-1000) 1000 units capsule Take 2,000 Units by mouth daily.     .Marland Kitchendofetilide (TIKOSYN) 125 MCG capsule Take 1 capsule (125 mcg total) by mouth 2 (two) times daily. 180 capsule 2  . furosemide (LASIX) 40 MG tablet Take 1 tablet (40 mg total) by mouth daily. 30 tablet 3  . ketotifen (ALAWAY) 0.025 % ophthalmic solution Place 1 drop into both eyes daily as needed (for allergies).    . loratadine (CLARITIN) 10 MG tablet  Take 10 mg by mouth daily as needed for allergies.     . Multiple Vitamin (MULTIVITAMIN WITH MINERALS) TABS tablet Take 1 tablet by mouth daily.    . naproxen sodium (ALEVE) 220 MG tablet Take 220 mg by mouth at bedtime.    . Omega-3 Fatty Acids (FISH OIL) 1200 MG CAPS Take 1,200 mg by mouth daily.     . potassium chloride SA (KLOR-CON M20) 20 MEQ tablet Take 10 mEq by mouth 2 (two) times daily.     . rosuvastatin (CRESTOR) 10 MG tablet Take 1 tablet (10 mg total) by mouth daily. (Patient taking differently: Take 10 mg by mouth at bedtime. ) 90 tablet 3  . triamcinolone (NASACORT) 55 MCG/ACT nasal inhaler Place 1 spray into both nostrils daily as needed (for allergies).     Marland Kitchen desoximetasone (TOPICORT) 0.25 % cream APPLY 1 APPLICATION TOPICALLY 2 TIMES  DAILY (Patient taking differently: Apply topically twice daily as needed for itching) 60 g 5  . dextromethorphan (DELSYM) 30 MG/5ML liquid Take 15 mg by mouth daily as needed for cough.     Marland Kitchen omeprazole (PRILOSEC OTC) 20 MG tablet Take 20 mg by mouth as needed.      Results for orders placed or performed during the hospital encounter of 02/13/18 (from the past 48 hour(s))  Basic metabolic panel     Status: Abnormal   Collection Time: 02/13/18  9:42 AM  Result Value Ref Range   Sodium 138 135 - 145 mmol/L   Potassium 4.0 3.5 - 5.1 mmol/L   Chloride 101 101 - 111 mmol/L   CO2 26 22 - 32 mmol/L   Glucose, Bld 88 65 - 99 mg/dL   BUN 25 (H) 6 - 20 mg/dL   Creatinine, Ser 0.91 0.44 - 1.00 mg/dL   Calcium 9.9 8.9 - 10.3 mg/dL   GFR calc non Af Amer 59 (L) >60 mL/min   GFR calc Af Amer >60 >60 mL/min    Comment: (NOTE) The eGFR has been calculated using the CKD EPI equation. This calculation has not been validated in all clinical situations. eGFR's persistently <60 mL/min signify possible Chronic Kidney Disease.    Anion gap 11 5 - 15    Comment: Performed at [facility] [address], [place], [place] [address]  Hemoglobin     Status: None   Collection Time: 02/13/18  9:42 AM  Result Value Ref Range   Hemoglobin 14.0 12.0 - 15.0 g/dL    Comment: Performed at [facility] [address], [place], [place] [address]  PT- INR Day of Surgery     Status: None   Collection Time: 02/13/18  9:42 AM  Result Value Ref Range   Prothrombin Time 13.3 11.4 - 15.2 seconds   INR 1.02     Comment: Performed at [facility] [address] [place], [place] [address]   No results found.  Review of Systems  All other systems reviewed and are negative.   Blood pressure (!) 167/53, pulse (!) 48, temperature 98.2 F (36.8 C), temperature source Oral, resp. rate 18, height 5' 7" (1.702 m), weight 154 lb (69.9 kg), SpO2 98 %. Physical Exam  Constitutional: She is oriented to  person, place, and time. She appears well-developed and well-nourished. No distress.  HENT:  Head: Normocephalic and atraumatic.  Right Ear: External ear normal.  Left Ear: External ear normal.  Nose: Nose normal.  Mouth/Throat: Oropharynx is clear and moist.  Moderately  hoarse, some breathiness.  Eyes: Pupils are equal, round, and reactive to light. Conjunctivae and EOM are normal.  Neck: Normal range of motion. Neck supple.  Cardiovascular: Normal rate.  Respiratory: Effort normal.  Musculoskeletal: Normal range of motion.  Neurological: She is alert and oriented to person, place, and time. No cranial nerve deficit.  Skin: Skin is warm and dry.  Psychiatric: She has a normal mood and affect. Her behavior is normal. Judgment and thought content normal.     Assessment/Plan Dysphonia, left vocal fold paresis To OR for SMDL with Prolaryn injection.  Melida Quitter, MD 02/13/2018, 12:08 PM

## 2018-02-13 NOTE — Anesthesia Postprocedure Evaluation (Signed)
Anesthesia Post Note  Patient: Barbara [name]  Procedure(s) Performed: DIRECT LARYNGOSCOPY WITH RADIAESSE INJECTION (N/A Throat)     Patient location during evaluation: PACU Anesthesia Type: General Level of consciousness: awake and alert Pain management: pain level controlled Vital Signs Assessment: post-procedure vital signs reviewed and stable Respiratory status: spontaneous breathing, nonlabored ventilation, respiratory function stable and patient connected to nasal cannula oxygen Cardiovascular status: blood pressure returned to baseline and stable Postop Assessment: no apparent nausea or vomiting Anesthetic complications: no    Last Vitals:  Vitals:   02/13/18 1455 02/13/18 1510  BP: (!) 169/84 (!) 169/89  Pulse: (!) 59 (!) 58  Resp: 12 14  Temp:    SpO2: 99% 99%    Last Pain:  Vitals:   02/13/18 1420  TempSrc:   PainSc: 0-No pain                 Orvetta Danielski EDWARD

## 2018-02-13 NOTE — Anesthesia Preprocedure Evaluation (Addendum)
Anesthesia Evaluation  Patient identified by MRN, date of birth, ID band Patient awake    Reviewed: Allergy & Precautions, NPO status , Patient's Chart, lab work & pertinent test results  History of Anesthesia Complications (+) PONV and history of anesthetic complications  Airway Mallampati: II  TM Distance: <3 FB Neck ROM: Full   Comment: TMJ Dental no notable dental hx.    Pulmonary shortness of breath, pneumonia, former smoker,    Pulmonary exam normal breath sounds clear to auscultation       Cardiovascular hypertension, Pt. on medications + CAD and +CHF  Normal cardiovascular exam+ dysrhythmias + Valvular Problems/Murmurs  Rhythm:Regular Rate:Normal  Echo 11/2017 - Left ventricle: The cavity size was normal. Systolic function was vigorous. The estimated ejection fraction was in the range of 70% to 75%. There was dynamic obstruction at restin the outflow tract, with a peak velocity of 289 cm/sec and a peak gradient of 33 mm Hg. Wall motion was normal; there were no regional wall motion abnormalities. Features are consistent with a pseudonormal left ventricular filling pattern, with concomitant abnormal relaxation and increased filling pressure (grade 2 diastolic dysfunction). - Aortic valve: Valve area (VTI): 2.07 cm^2. Valve area (Vmax): 2.08 cm^2. Valve area (Vmean): 2.3 cm^2. - Mitral valve: There was mild to moderate regurgitation directed centrally. - Left atrium: The atrium was moderately to severely dilated. - Right atrium: The atrium was moderately dilated. - Pulmonary arteries: PA peak pressure: 53 mm Hg (S).  Impressions: - Although the morphology is not consistent with hypertrophic cardiomyopathy, there is a mildly increased subvalvular outflow tract gradient due to hyperdynamic LV contraction and increased stroke volume.   Neuro/Psych negative neurological ROS  negative psych ROS   GI/Hepatic Neg liver ROS, GERD   ,  Endo/Other  negative endocrine ROS  Renal/GU negative Renal ROS  negative genitourinary   Musculoskeletal  (+) Arthritis ,   Abdominal   Peds negative pediatric ROS (+)  Hematology negative hematology ROS (+)   Anesthesia Other Findings   Reproductive/Obstetrics negative OB ROS                            Anesthesia Physical  Anesthesia Plan  ASA: III  Anesthesia Plan: General   Post-op Pain Management:    Induction: Intravenous  PONV Risk Score and Plan: 4 or greater and Ondansetron, Dexamethasone and Treatment may vary due to age or medical condition  Airway Management Planned: Oral ETT  Additional Equipment:   Intra-op Plan:   Post-operative Plan: Extubation in OR  Informed Consent: I have reviewed the patients History and Physical, chart, labs and discussed the procedure including the risks, benefits and alternatives for the proposed anesthesia with the patient or authorized representative who has indicated his/her understanding and acceptance.   Dental advisory given  Plan Discussed with: CRNA  Anesthesia Plan Comments:         Anesthesia Quick Evaluation

## 2018-02-16 ENCOUNTER — Encounter (HOSPITAL_COMMUNITY): Payer: Self-pay | Admitting: Otolaryngology

## 2018-02-16 NOTE — Op Note (Signed)
NAME:  Barbara [name], Barbara [name]             ACCOUNT NO.:  MEDICAL RECORD NO.:  [id number]  LOCATION:                                 FACILITY:  PHYSICIAN:  Onnie Graham, MD     DATE OF BIRTH:  [birth year]  DATE OF PROCEDURE:  02/13/2018 DATE OF DISCHARGE:                              OPERATIVE REPORT   PREOPERATIVE DIAGNOSES: 1. Dysphonia. 2. Left vocal fold paresis.  POSTOPERATIVE DIAGNOSES: 1. Dysphonia. 2. Left vocal fold paresis.  PROCEDURE:  Suspended microdirect laryngoscopy with Prolaryn injection.  ANESTHESIA:  General jet Venturi ventilation.  COMPLICATIONS:  None.  SURGEON:  Onnie Graham, MD.  INDICATION:  The patient is a 77 year old female, who has been hoarse since January when she was treated for atrial fibrillation and was found to have a left vocal fold paresis.  CT imaging of the neck was negative for causative etiology.  She presents to the operating room for surgical management.  FINDINGS:  The left vocal fold was bowed compared to the right. Prolaryn 0.7 cc was injected in the left vocal fold and 0.15 cc was injected in the right.  DESCRIPTION OF PROCEDURE:  The patient was identified in the holding room.  Informed consent having been obtained including discussion of risks, benefits, alternatives, the patient was brought to the operative suite and put on the operating table in supine position.  Anesthesia was induced and the patient was maintained via mask ventilation.  The eyes were taped closed and bed was turned 90 degrees from anesthesia.  A tooth guard was placed over the upper teeth and the larynx was then exposed using a Storz laryngoscope placed in the supraglottic position. Jet Venturi ventilation was initiated.  The laryngoscope was suspended on the Mayo stand using a Lewy arm.  A 0-degree telescope was used to make a preoperative photograph.  Under the operating microscope, Prolaryn was then injected into the left vocal fold first and  then into the right with a total volumes listed above.  The left vocal fold was injected with about two-thirds of the volume posteriorly and one-third anteriorly.  After this completed, the airway was suctioned.  A postoperative photograph was made.  The jet Venturi was stopped and the laryngoscope was taken out of suspension and removed from the patient's mouth while suctioning the airway.  The tooth guard was removed and she was returned to mask ventilation.  The bed was turned back to anesthesia for wake-up and she was moved to the recovery room in stable condition.     Onnie Graham, MD     DDB/MEDQ  D:  02/13/2018  T:  02/13/2018  Job:  [id number]  cc:   Onnie Graham, MD

## 2018-03-17 ENCOUNTER — Encounter (HOSPITAL_COMMUNITY): Payer: Self-pay | Admitting: Nurse Practitioner

## 2018-03-17 ENCOUNTER — Ambulatory Visit (HOSPITAL_COMMUNITY)
Admission: RE | Admit: 2018-03-17 | Discharge: 2018-03-17 | Disposition: A | Discharge status: Home / Self Care | Payer: Medicare Other | Source: Ambulatory Visit | Attending: Nurse Practitioner | Admitting: Nurse Practitioner

## 2018-03-17 VITALS — BP 144/62 | HR 51 | Ht 67.0 in | Wt 159.2 lb

## 2018-03-17 DIAGNOSIS — Z7901 Long term (current) use of anticoagulants: Secondary | ICD-10-CM | POA: Diagnosis not present

## 2018-03-17 DIAGNOSIS — E785 Hyperlipidemia, unspecified: Secondary | ICD-10-CM | POA: Insufficient documentation

## 2018-03-17 DIAGNOSIS — M1990 Unspecified osteoarthritis, unspecified site: Secondary | ICD-10-CM | POA: Diagnosis not present

## 2018-03-17 DIAGNOSIS — Z885 Allergy status to narcotic agent status: Secondary | ICD-10-CM | POA: Insufficient documentation

## 2018-03-17 DIAGNOSIS — I4891 Unspecified atrial fibrillation: Secondary | ICD-10-CM | POA: Insufficient documentation

## 2018-03-17 DIAGNOSIS — Z79899 Other long term (current) drug therapy: Secondary | ICD-10-CM | POA: Diagnosis not present

## 2018-03-17 DIAGNOSIS — Z888 Allergy status to other drugs, medicaments and biological substances status: Secondary | ICD-10-CM | POA: Insufficient documentation

## 2018-03-17 DIAGNOSIS — Z955 Presence of coronary angioplasty implant and graft: Secondary | ICD-10-CM | POA: Insufficient documentation

## 2018-03-17 DIAGNOSIS — I4892 Unspecified atrial flutter: Secondary | ICD-10-CM

## 2018-03-17 DIAGNOSIS — Z87891 Personal history of nicotine dependence: Secondary | ICD-10-CM | POA: Insufficient documentation

## 2018-03-17 DIAGNOSIS — I5030 Unspecified diastolic (congestive) heart failure: Secondary | ICD-10-CM | POA: Insufficient documentation

## 2018-03-17 DIAGNOSIS — I110 Hypertensive heart disease with heart failure: Secondary | ICD-10-CM | POA: Diagnosis not present

## 2018-03-17 DIAGNOSIS — R001 Bradycardia, unspecified: Secondary | ICD-10-CM | POA: Insufficient documentation

## 2018-03-17 DIAGNOSIS — K219 Gastro-esophageal reflux disease without esophagitis: Secondary | ICD-10-CM | POA: Diagnosis not present

## 2018-03-17 DIAGNOSIS — Z882 Allergy status to sulfonamides status: Secondary | ICD-10-CM | POA: Diagnosis not present

## 2018-03-17 DIAGNOSIS — I2510 Atherosclerotic heart disease of native coronary artery without angina pectoris: Secondary | ICD-10-CM | POA: Diagnosis not present

## 2018-03-17 LAB — MAGNESIUM: Magnesium: 1.8 mg/dL (ref 1.7–2.4)

## 2018-03-17 LAB — BASIC METABOLIC PANEL
Anion gap: 9 (ref 5–15)
BUN: 18 mg/dL (ref 6–20)
CO2: 28 mmol/L (ref 22–32)
Calcium: 10.1 mg/dL (ref 8.9–10.3)
Chloride: 104 mmol/L (ref 101–111)
Creatinine, Ser: 0.92 mg/dL (ref 0.44–1.00)
GFR calc Af Amer: >60 mL/min (ref >60)
GFR calc non Af Amer: 58 mL/min — ABNORMAL LOW (ref >60)
Glucose, Bld: 120 mg/dL — ABNORMAL HIGH (ref 65–99)
Potassium: 3.9 mmol/L (ref 3.5–5.1)
Sodium: 141 mmol/L (ref 135–145)

## 2018-03-17 NOTE — Progress Notes (Signed)
Primary Care Physician: Laurey Morale, MD Referring Physician: [facility] f/u EP: Dr. Rayann Heman Cardiologist: Dr. Marcell Barlow LADONA ROSTEN is a 77 y.o. female with a h/o CAD (PCI in 2000), HTN, HFpEF, HLD,and new AFlutter/fib admitted to [facility] 11/16/16 with weakness/dizziness, SOB, fatigue, and an episode of chest tenderness, noted SB, was admitted her metoprolol held, and IV diuresis started for fluid OL  She was seen by Dr Claiborne Billings in the office on 1/7 and found to be in new onset atrial flutter that was rate controlled. She was started on Metoprolol and Eliquis, developed weakness, dizziness, and found in SB, her metoprolol held and resulted in AF w/RVR.  Cardiology service admitted, episode of chest tenderness (epigastric) was not felt to be clearly anginal and not planned for ischemic w/u.  Echowith EF 70-75%, no wall motion abnormalities, grade 2 diastolic dysfunction, andmildly increased subvalvular outflow tract gradient due to hyperdynamic LV contraction - With regards to mild LVOT obstruction, recommendation was to avoid vasodilators and dehydration. EP service was consulted to the case and decided to initiate Tikosyn once labs were optimized. Tikosyn was initiated.  Renal function and electrolytes were followed.  Her QTc lengthened, requiring down-titration ultimately to 128mcg dose, at this dose, she was monitored for an additional 3 doses and her QTc remained stable.  She converted to SR with drug and did not require DCCV.  She was monitored until discharge on telemetry which demonstrated SB/SR 50's-60's.  The patient was ambulatory and feeling well.  On the day of discharge, the patient was examined by Dr. Rayann Heman who considered her stable for discharge to home.  Follow-up has been arranged with the AFib clinic in 1 week and with Dr Rayann Heman in 4 weeks.   In the afib clinic,1/ 28, she feels well and is in SR. Qtc stable at 422 ms.   F/u in afib clinic, 5/14, for surveillance of tikosyn use.  She feels well. She has not noted any afib. Being compliant with Tikosyn and eliquis. Saw Dr. Rayann Heman in February and feeling well.    Today, she denies symptoms of palpitations, chest pain, shortness of breath, orthopnea, PND, lower extremity edema, dizziness, presyncope, syncope, or neurologic sequela. The patient is tolerating medications without difficulties and is otherwise without complaint today.   Past Medical History:  Diagnosis Date  . A-fib ([place])   . Allergy   . CAD (coronary artery disease)    sees Dr. Shelva Majestic  cardiac stents - 2000  . CHF (congestive heart failure) ([place])   . Colon polyps   . Dyspnea    02/12/18 " when my heart gets out of rhythm, it has not been out of rhythym- since I have been on Tikosyn (11/2017)  . Dysrhythmia    afib fib  . GERD (gastroesophageal reflux disease)    takes OTC- Omeprazole- prn  . Heart murmur   . History of stress test    show normal perfusion without scar or ischemia, post EF 68%  . Hx of echocardiogram    show an EF 55%-60% range with grade 1 diastolic dysfunction, she had mitral anular calcification with mild MR, moderate LA dilation and mild pulmonary hypertension with a PA estimated pressure of 71mm  . Hyperlipidemia   . Hypertension   . Osteoarthritis   . Pneumonia    hx of 2015   . PONV (postoperative nausea and vomiting)    Past Surgical History:  Procedure Laterality Date  . CARDIAC CATHETERIZATION    .  cardiac stents  2000  . COLONOSCOPY  01-05-14   per Dr. Teena Irani, clear, no repeats needed   . CORONARY STENT PLACEMENT  2000   in [facility]  . DIRECT LARYNGOSCOPY WITH RADIAESSE INJECTION N/A 02/13/2018   Procedure: DIRECT LARYNGOSCOPY WITH RADIAESSE INJECTION;  Surgeon: Melida Quitter, MD;  Location: [facility];  Service: ENT;  Laterality: N/A;  . KNEE ARTHROSCOPY Left 01/06/2015   Procedure: LEFT KNEE ARTHROSCOPY, abrasion chondroplasty of the medial femerol condryl,medial and lateral menisectomy, microfracture , synovectomy of  the suprpatellar pouch;  Surgeon: Latanya Maudlin, MD;  Location: [facility];  Service: Orthopedics;  Laterality: Left;  Marland Kitchen VAGINAL HYSTERECTOMY  1971    Current Outpatient Medications  Medication Sig Dispense Refill  . apixaban (ELIQUIS) 5 MG TABS tablet Take 1 tablet (5 mg total) by mouth 2 (two) times daily. 60 tablet 0  . candesartan (ATACAND) 8 MG tablet Take 1 tablet (8 mg total) by mouth daily. 90 tablet 3  . Cholecalciferol (D3-1000) 1000 units capsule Take 2,000 Units by mouth daily.     Marland Kitchen desoximetasone (TOPICORT) 0.25 % cream APPLY 1 APPLICATION TOPICALLY 2 TIMES DAILY (Patient taking differently: Apply topically twice daily as needed for itching) 60 g 5  . dextromethorphan (DELSYM) 30 MG/5ML liquid Take 15 mg by mouth daily as needed for cough.     . dofetilide (TIKOSYN) 125 MCG capsule Take 1 capsule (125 mcg total) by mouth 2 (two) times daily. 180 capsule 2  . furosemide (LASIX) 40 MG tablet Take 1 tablet (40 mg total) by mouth daily. 30 tablet 3  . ketotifen (ALAWAY) 0.025 % ophthalmic solution Place 1 drop into both eyes daily as needed (for allergies).    . loratadine (CLARITIN) 10 MG tablet Take 10 mg by mouth daily as needed for allergies.     . Multiple Vitamin (MULTIVITAMIN WITH MINERALS) TABS tablet Take 1 tablet by mouth daily.    . naproxen sodium (ALEVE) 220 MG tablet Take 220 mg by mouth at bedtime.    . Omega-3 Fatty Acids (FISH OIL) 1200 MG CAPS Take 1,200 mg by mouth daily.     Marland Kitchen omeprazole (PRILOSEC OTC) 20 MG tablet Take 20 mg by mouth as needed.    . potassium chloride (K-DUR) 10 MEQ tablet Take 10 mEq by mouth 2 (two) times daily.    . rosuvastatin (CRESTOR) 10 MG tablet Take 1 tablet (10 mg total) by mouth daily. (Patient taking differently: Take 10 mg by mouth at bedtime. ) 90 tablet 3  . triamcinolone (NASACORT) 55 MCG/ACT nasal inhaler Place 1 spray into both nostrils daily as needed (for allergies).      No current facility-administered medications for this  encounter.     Allergies  Allergen Reactions  . Lidocaine Anaphylaxis  . Morphine Other (See Comments)    Body shuts down  . Procaine Hcl Rash and Other (See Comments)    Anything with caine in it   . Sulfonamide Derivatives Hives  . Tape Itching  . Vytorin [Ezetimibe-Simvastatin] Other (See Comments)    Pt unsure of allergy     Social History   Socioeconomic History  . Marital status: Married    Spouse name: Not on file  . Number of children: Not on file  . Years of education: Not on file  . Highest education level: Not on file  Occupational History  . Not on file  Social Needs  . Financial resource strain: Not on file  . Food insecurity:  Worry: Not on file    Inability: Not on file  . Transportation needs:    Medical: Not on file    Non-medical: Not on file  Tobacco Use  . Smoking status: Former Smoker    Years: 40.00    Last attempt to quit: 11/04/1996    Years since quitting: 21.3  . Smokeless tobacco: Never Used  Substance and Sexual Activity  . Alcohol use: No    Alcohol/week: 0.0 oz  . Drug use: No  . Sexual activity: Not on file  Lifestyle  . Physical activity:    Days per week: Not on file    Minutes per session: Not on file  . Stress: Not on file  Relationships  . Social connections:    Talks on phone: Not on file    Gets together: Not on file    Attends religious service: Not on file    Active member of club or organization: Not on file    Attends meetings of clubs or organizations: Not on file    Relationship status: Not on file  . Intimate partner violence:    Fear of current or ex partner: Not on file    Emotionally abused: Not on file    Physically abused: Not on file    Forced sexual activity: Not on file  Other Topics Concern  . Not on file  Social History Narrative  . Not on file    Family History  Problem Relation Age of Onset  . Cancer Maternal Grandmother   . Heart disease Maternal Grandfather     ROS- All systems are  reviewed and negative except as per the HPI above  Physical Exam: Vitals:   03/17/18 1056  BP: (!) 144/62  Pulse: (!) 51  Weight: 159 lb 3.2 oz (72.2 kg)  Height: 5\' 7"  (1.702 m)   Wt Readings from Last 3 Encounters:  03/17/18 159 lb 3.2 oz (72.2 kg)  02/13/18 154 lb (69.9 kg)  01/13/18 137 lb (62.1 kg)    Labs: Lab Results  Component Value Date   NA 138 02/13/2018   K 4.0 02/13/2018   CL 101 02/13/2018   CO2 26 02/13/2018   GLUCOSE 88 02/13/2018   BUN 25 (H) 02/13/2018   CREATININE 0.91 02/13/2018   CALCIUM 9.9 02/13/2018   MG 1.8 12/01/2017   Lab Results  Component Value Date   INR 1.02 02/13/2018   Lab Results  Component Value Date   CHOL 143 11/06/2017   HDL 57.60 11/06/2017   LDLCALC 63 11/06/2017   TRIG 111.0 11/06/2017     GEN- The patient is well appearing, alert and oriented x 3 today.   Head- normocephalic, atraumatic Eyes-  Sclera clear, conjunctiva pink Ears- hearing intact Oropharynx- clear Neck- supple, no JVP Lymph- no cervical lymphadenopathy Lungs- Clear to ausculation bilaterally, normal work of breathing Heart- Regular rate and rhythm, no murmurs, rubs or gallops, PMI not laterally displaced GI- soft, NT, ND, + BS Extremities- no clubbing, cyanosis, or edema MS- no significant deformity or atrophy Skin- no rash or lesion Psych- euthymic mood, full affect Neuro- strength and sensation are intact  EKG- Sinus brady at 51  bpm, pr int 170 ms, qrs int 90 ms, qtc 396 ms Epic records reviewed    Assessment and Plan: 1. Afib  Maintaining SR with Tikosyn 125 mcg bid Reminded no OTC benadryl and to be aware of many drug/drug interactions Bmet/matg today  2. CHF No obvious fluid  Continue furosemide  Weigh daily  Avoid salt  3. Chadsvasc score of at least 5 Continue apixaban 5 mg bid  General bleeding precautions discussed and importance of not missing doses   F/u with Dr. Claiborne Billings  as recall  in September   Butch Penny C. Calie Buttrey,  [facility] [address] [place], [place] [address] [phone]

## 2018-03-24 ENCOUNTER — Ambulatory Visit (HOSPITAL_COMMUNITY): Payer: Medicare Other | Admitting: Nurse Practitioner

## 2018-03-31 ENCOUNTER — Other Ambulatory Visit: Payer: Self-pay | Admitting: Cardiovascular Disease

## 2018-03-31 NOTE — Telephone Encounter (Signed)
Follow up     *STAT* If patient is at the pharmacy, call can be transferred to refill team.   1. Which medications need to be refilled? (please list name of each medication and dose if known) rosuvastatin (CRESTOR) 10 MG tablet  2. Which pharmacy/location (including street and city if local pharmacy) is medication to be sent to?[place], [place] [facility] [place]. Do they need a 30 day or 90 day supply? [place]

## 2018-03-31 NOTE — Telephone Encounter (Signed)
Rx sent to pharmacy   

## 2018-04-24 ENCOUNTER — Telehealth (HOSPITAL_COMMUNITY): Payer: Self-pay | Admitting: *Deleted

## 2018-04-24 NOTE — Telephone Encounter (Signed)
Patient called in stating for the last week she has been more short of breath with exertion along with some chest tightness. Pt denies any other symptoms. States her BP is 127/58 HR 59 regular. Her weight is stable with no swelling or abdominal bloating. Pt has missed no doses of her tikosyn or eliquis. Instructed pt to touch base with Dr. Evette Georges office since he is her general cardiologist. Pt in agreement will give his office a call.

## 2018-05-25 ENCOUNTER — Telehealth: Payer: Self-pay | Admitting: Cardiovascular Disease

## 2018-05-25 NOTE — Telephone Encounter (Signed)
New Message:      Pt said she has an appointment at 11:00 today with her dentist for a cleaning. She needs to know if she  Have any resstrictions, because she is on Eliquis. She said her dentist was supposed to have contacted you on Friday.

## 2018-05-25 NOTE — Telephone Encounter (Signed)
Advised patient no restrictions for teeth cleaning she is having today

## 2018-07-01 ENCOUNTER — Other Ambulatory Visit: Payer: Self-pay | Admitting: Otolaryngology

## 2018-07-26 ENCOUNTER — Telehealth: Payer: Self-pay | Admitting: Family Medicine

## 2018-07-30 ENCOUNTER — Encounter (HOSPITAL_COMMUNITY): Payer: Self-pay

## 2018-07-30 ENCOUNTER — Encounter (HOSPITAL_COMMUNITY)
Admission: RE | Admit: 2018-07-30 | Discharge: 2018-07-30 | Disposition: A | Discharge status: Home / Self Care | Payer: Medicare Other | Source: Ambulatory Visit | Attending: Otolaryngology | Admitting: Otolaryngology

## 2018-07-30 ENCOUNTER — Other Ambulatory Visit: Payer: Self-pay

## 2018-07-30 DIAGNOSIS — Z87891 Personal history of nicotine dependence: Secondary | ICD-10-CM | POA: Diagnosis not present

## 2018-07-30 DIAGNOSIS — I110 Hypertensive heart disease with heart failure: Secondary | ICD-10-CM | POA: Insufficient documentation

## 2018-07-30 DIAGNOSIS — E785 Hyperlipidemia, unspecified: Secondary | ICD-10-CM | POA: Diagnosis not present

## 2018-07-30 DIAGNOSIS — R490 Dysphonia: Secondary | ICD-10-CM | POA: Insufficient documentation

## 2018-07-30 DIAGNOSIS — I2510 Atherosclerotic heart disease of native coronary artery without angina pectoris: Secondary | ICD-10-CM | POA: Insufficient documentation

## 2018-07-30 DIAGNOSIS — Z79899 Other long term (current) drug therapy: Secondary | ICD-10-CM | POA: Insufficient documentation

## 2018-07-30 DIAGNOSIS — Z90710 Acquired absence of both cervix and uterus: Secondary | ICD-10-CM | POA: Diagnosis not present

## 2018-07-30 DIAGNOSIS — Z86010 Personal history of colonic polyps: Secondary | ICD-10-CM | POA: Diagnosis not present

## 2018-07-30 DIAGNOSIS — K219 Gastro-esophageal reflux disease without esophagitis: Secondary | ICD-10-CM | POA: Diagnosis not present

## 2018-07-30 DIAGNOSIS — Z955 Presence of coronary angioplasty implant and graft: Secondary | ICD-10-CM | POA: Diagnosis not present

## 2018-07-30 DIAGNOSIS — Z01812 Encounter for preprocedural laboratory examination: Secondary | ICD-10-CM | POA: Diagnosis not present

## 2018-07-30 DIAGNOSIS — I509 Heart failure, unspecified: Secondary | ICD-10-CM | POA: Diagnosis not present

## 2018-07-30 DIAGNOSIS — I4892 Unspecified atrial flutter: Secondary | ICD-10-CM | POA: Diagnosis not present

## 2018-07-30 DIAGNOSIS — M1990 Unspecified osteoarthritis, unspecified site: Secondary | ICD-10-CM | POA: Insufficient documentation

## 2018-07-30 DIAGNOSIS — Z7901 Long term (current) use of anticoagulants: Secondary | ICD-10-CM | POA: Diagnosis not present

## 2018-07-30 DIAGNOSIS — I4891 Unspecified atrial fibrillation: Secondary | ICD-10-CM | POA: Diagnosis not present

## 2018-07-30 LAB — CBC
HCT: 40.9 % (ref 36.0–46.0)
Hemoglobin: 13 g/dL (ref 12.0–15.0)
MCH: 29.3 pg (ref 26.0–34.0)
MCHC: 31.8 g/dL (ref 30.0–36.0)
MCV: 92.3 fL (ref 78.0–100.0)
Platelets: 224 10*3/uL (ref 150–400)
RBC: 4.43 MIL/uL (ref 3.87–5.11)
RDW: 12.3 % (ref 11.5–15.5)
WBC: 5.3 10*3/uL (ref 4.0–10.5)

## 2018-07-30 LAB — BASIC METABOLIC PANEL
Anion gap: 10 (ref 5–15)
BUN: 24 mg/dL — ABNORMAL HIGH (ref 8–23)
CO2: 26 mmol/L (ref 22–32)
Calcium: 10.3 mg/dL (ref 8.9–10.3)
Chloride: 104 mmol/L (ref 98–111)
Creatinine, Ser: 1.1 mg/dL — ABNORMAL HIGH (ref 0.44–1.00)
GFR calc Af Amer: 54 mL/min — ABNORMAL LOW (ref >60)
GFR calc non Af Amer: 46 mL/min — ABNORMAL LOW (ref >60)
Glucose, Bld: 152 mg/dL — ABNORMAL HIGH (ref 70–99)
Potassium: 4 mmol/L (ref 3.5–5.1)
Sodium: 140 mmol/L (ref 135–145)

## 2018-07-30 NOTE — Progress Notes (Addendum)
PCP is Dr. Nydia Bouton Cardio is Dr. Corky Downs  LOV 01/2018 She had heart failure back in Jan 19, was treated then has been seeing Dr. Claiborne Billings. Denies any problems (except her knee) with her heart.  I have spoken with Lattie Haw at Dr. Redmond Baseman' office as to when patient needs to stop her eloquis--awaiting a response. Patient has been instructed via phone to take eloquis up until the day of surgery and then resume when she gets back home.

## 2018-07-30 NOTE — Pre-Procedure Instructions (Signed)
Barbara [name]  07/30/2018      Your procedure is scheduled on Friday, October 4.  Report to [facility] Admitting at 7:00 A.M.               Your surgery or procedure is scheduled for 9:00 AM   Call this number if you have problems the morning of surgery: [phone]  This is the number for the Pre- Surgical Desk.               For any other questions prior to surgery Monday - Friday, 8:00 AM - 4:00 PM, call [phone]- PAT desk, ask to speak to any nurse.  Remember:  Do not eat or drink after midnight Thursday, October 3   Take these medicines the morning of surgery with A SIP OF WATER:  dofetilide (TIKOSYN)               rosuvastatin (CRESTOR)   Follow Dr Redmond Baseman instructions on  when to hold apixaban Arne [place])   If needed may take: dextromethorphan (DELSYM)  loratadine (CLARITIN)  omeprazole (PRILOSEC OTC) Eye Drops and nasal spray  1 Week prior to surgery STOP taking Aspirin, Aspirin Products (Goody Powder, Excedrin Migraine), Ibuprofen (Advil), Naproxen (Aleve), Vitamins and Herbal Products (ie Fish Oil)  Special instructions:   [place]- Preparing For Surgery  Before surgery, you can play an important role. Because skin is not sterile, your skin needs to be as free of germs as possible. You can reduce the number of germs on your skin by washing with CHG (chlorahexidine gluconate) Soap before surgery.  CHG is an antiseptic cleaner which kills germs and bonds with the skin to continue killing germs even after washing.    Oral Hygiene is also important to reduce your risk of infection.  Remember - BRUSH YOUR TEETH THE MORNING OF SURGERY WITH YOUR REGULAR TOOTHPASTE  Please do not use if you have an allergy to CHG or antibacterial soaps. If your skin becomes reddened/irritated stop using the CHG.  Do not shave (including legs and underarms) for at least 48 hours prior to first CHG shower. It is OK to shave your face.  Please follow these instructions  carefully.   1. Shower the NIGHT BEFORE SURGERY and the MORNING OF SURGERY with CHG.   2. If you chose to wash your hair, wash your hair first as usual with your normal shampoo.  3. After you shampoo, rinse your hair and body thoroughly to remove the shampoo.  4. Use CHG as you would any other liquid soap. You can apply CHG directly to the skin and wash gently with a scrungie or a clean washcloth.   5. Apply the CHG Soap to your body ONLY FROM THE NECK DOWN.  Do not use on open wounds or open sores. Avoid contact with your eyes, ears, mouth and genitals (private parts). Wash Face and genitals (private parts)  with your normal soap.  6. Wash thoroughly, paying special attention to the area where your surgery will be performed.  7. Thoroughly rinse your body with warm water from the neck down.  8. DO NOT shower/wash with your normal soap after using and rinsing off the CHG Soap.  9. Pat yourself dry with a CLEAN TOWEL.  10. Wear CLEAN PAJAMAS to bed the night before surgery, wear comfortable clothes the morning of surgery  11. Place CLEAN SHEETS on your bed the night of your first shower and DO NOT SLEEP WITH PETS.  Day of Surgery: Shower as above  Do not apply any deodorants/lotions, powders , colones.  Please wear clean clothes to the hospital/surgery center.   Remember to brush your teeth WITH YOUR REGULAR TOOTHPASTE.  Do not wear jewelry, make-up or nail polish.   Do not shave 48 hours prior to surgery.  Men may shave face and neck.  Do not bring valuables to the hospital.  [facility] is not responsible for any belongings or valuables.  Contacts, dentures or bridgework may not be worn into surgery.  Leave your suitcase in the car.  After surgery it may be brought to your room.  For patients admitted to the hospital, discharge time will be determined by your treatment team.  Patients discharged the day of surgery will not be allowed to drive home.   Please read over the  following fact sheets that you were given.

## 2018-07-31 NOTE — Telephone Encounter (Signed)
Pt called in to follow up on refill request for potassium. Pt says that PCP started her on medication. Pt says that she has 4 pills left and need Rx.   Pharmacy:  [place], [place] [phone] (Phone) [phone] (Fax)     Please assist.

## 2018-07-31 NOTE — Progress Notes (Signed)
Anesthesia Chart Review:  Case:  [id number] Date/Time:  08/07/18 0845   Procedure:  MICROLARYNGOSCOPY WITH VOCAL CORD INJECTION of Prolaryn and jet ventilation (N/A )   Anesthesia type:  General   Pre-op diagnosis:  dysphonia   Location:  [facility] OR ROOM 09 / [place] OR   Surgeon:  Melida Quitter, MD      DISCUSSION: Patient is a 77 year old female scheduled for the above procedure. History includes former smoker (quit '98), post-operative N/V, HTN, HLD, murmur, CAD (s/p BMS LAD '00), afib/flutter 11/2017, CHF, dyspnea, GERD.  - She was noted to be in atrial flutter at 11/10/17 cardiology visit and was started on metoprolol and Eliquis, however she later required admission 11/18/17 for acute diastolic CHF and was seen by EP cardiologist Dr. Rayann Heman for sinus bradycardia and afib with RVR once b-blocker held. She was started on Tikosyn and ultimately pharmacologically cardioverted back to SR. Echo showed normal LVEF without wall motion but with mildly increased subvalvular outflow tract gradient due to hyperdynamic LV contraction, so recommendation to avoid vasodilators and dehydration. No ischemic work-up recommended per discharge summary. She was maintaining SR at her [facility] follow-up appointment on 12/01/17.   - At her 12/22/17 appointment with Dr. Rayann Heman, he referred her to ENT for hoarseness since her hospitalization. Fiberoptic exam showed weakness of the left vocal cord without defined etiology seen on CT scan. She is s/p direct laryngoscopy with Radiasesse injection 02/13/18. She reported persistent fatiguing dysphonia at her 06/23/18 visit with Dr. Redmond Baseman so he recommended revision injection augmentation. His office staff advised PAT RN that patient should hold Eliquis on the morning of her procedure.   She denied chest pain or new/worsening CV/CHF symptoms such as new SOB or edema at her PAT visit. Tolerated similar procedure less than six months ago. Last seen at the [facility] 03/2018 with no new changes. If no  acute changes then I would anticipate that she can proceed as planned from an anesthesia standpoint.    VS: BP (!) 152/45   Pulse 63   Temp 36.7 C   Resp 20   Ht 5\' 8"  (1.727 m)   Wt 70 kg   SpO2 97%   BMI 23.46 kg/m   PROVIDERS: Laurey Morale, MD is PCP  Shelva Majestic, MD is cardiologist. Last visit 01/13/18.  Thompson Grayer, MD is EP cardiologist. Last visit 12/22/17. He recommended PRN EP follow-up with continued follow-up with Dr. Claiborne Billings and the [facility]. She was last seen by Roderic Palau, NP on 03/17/18 and was maintaining SR/SB and without acute HF symptoms. Apixaban (Chadsvasc score of at least 5) and Tikosyn continued.    LABS: Labs reviewed: Acceptable for surgery. (all labs ordered are listed, but only abnormal results are displayed)  Labs Reviewed  BASIC METABOLIC PANEL - Abnormal; Notable for the following components:      Result Value   Glucose, Bld 152 (*)    BUN 24 (*)    Creatinine, Ser 1.10 (*)    GFR calc non Af Amer 46 (*)    GFR calc Af Amer 54 (*)    All other components within normal limits  CBC    IMAGES: CT soft tissue neck 01/20/18: IMPRESSION: 1. 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 No focal lesion along the expected course of the left recurrent laryngeal nerve to the level of the aortic arch. 2. Minimal atherosclerotic changes without significant stenosis or aneurysm. 3. Pleuroparenchymal scarring at the lung apices bilaterally without other  pulmonary lesions. 4. Multilevel degenerative changes in the cervical spine as described.   EKG: 03/17/18: SB at 51 bpm. LVH with repolarization abnormality. Cannot rule out septal infarct (age undetermined). Sine previous tracing 02/13/18, T wave inversion improved.    CV: Echo 11/17/17: Study Conclusions - Left ventricle: The cavity size was normal. Systolic function was   vigorous. The estimated ejection fraction was in the range of 70%   to 75%. There was dynamic obstruction at restin  the outflow   tract, with a peak velocity of 289 cm/sec and a peak gradient of   33 mm Hg. Wall motion was normal; there were no regional wall   motion abnormalities. Features are consistent with a pseudonormal   left ventricular filling pattern, with concomitant abnormal   relaxation and increased filling pressure (grade 2 diastolic   dysfunction). - Aortic valve: Valve area (VTI): 2.07 cm^2. Valve area (Vmax):   2.08 cm^2. Valve area (Vmean): 2.3 cm^2. - Mitral valve: There was mild to moderate regurgitation directed   centrally. - Left atrium: The atrium was moderately to severely dilated. - Right atrium: The atrium was moderately dilated. - Pulmonary arteries: PA peak pressure: 53 mm Hg (S). Impressions: - Although the morphology is not consistent with hypertrophic   cardiomyopathy, there is a mildly increased subvalvular outflow   tract gradient due to hyperdynamic LV contraction and increased   stroke volume.  Nuclear stress test 10/06/14: Overall Impression:  Normal stress nuclear study. LV Wall Motion:  NL LV Function; NL Wall Motion, EF 67%   Past Medical History:  Diagnosis Date  . A-fib ([place])   . Allergy   . CAD (coronary artery disease)    sees Dr. Shelva Majestic  cardiac stents - 2000  . CHF (congestive heart failure) ([place])   . Colon polyps   . Dyspnea    02/12/18 " when my heart gets out of rhythm, it has not been out of rhythym- since I have been on Tikosyn (11/2017)  . Dysrhythmia    afib fib  . GERD (gastroesophageal reflux disease)    takes OTC- Omeprazole- prn  . Heart murmur   . History of stress test    show normal perfusion without scar or ischemia, post EF 68%  . Hx of echocardiogram    show an EF 55%-60% range with grade 1 diastolic dysfunction, she had mitral anular calcification with mild MR, moderate LA dilation and mild pulmonary hypertension with a PA estimated pressure of 5mm  . Hyperlipidemia   . Hypertension   . Osteoarthritis   . Pneumonia     hx of 2015   . PONV (postoperative nausea and vomiting)     Past Surgical History:  Procedure Laterality Date  . CARDIAC CATHETERIZATION     11/2017  . cardiac stents  2000  . COLONOSCOPY  01-05-14   per Dr. Teena Irani, clear, no repeats needed   . CORONARY STENT PLACEMENT  2000   in LAD  . DIRECT LARYNGOSCOPY WITH RADIAESSE INJECTION N/A 02/13/2018   Procedure: DIRECT LARYNGOSCOPY WITH RADIAESSE INJECTION;  Surgeon: Melida Quitter, MD;  Location: [facility];  Service: ENT;  Laterality: N/A;  . KNEE ARTHROSCOPY Left 01/06/2015   Procedure: LEFT KNEE ARTHROSCOPY, abrasion chondroplasty of the medial femerol condryl,medial and lateral menisectomy, microfracture , synovectomy of the suprpatellar pouch;  Surgeon: Latanya Maudlin, MD;  Location: [facility];  Service: Orthopedics;  Laterality: Left;  Marland Kitchen VAGINAL HYSTERECTOMY  1971    MEDICATIONS: . apixaban (ELIQUIS) 5  MG TABS tablet  . candesartan (ATACAND) 8 MG tablet  . Cholecalciferol (VITAMIN D3) 2000 units TABS  . Cyanocobalamin (VITAMIN B-12 PO)  . desoximetasone (TOPICORT) 0.25 % cream  . dextromethorphan (DELSYM) 30 MG/5ML liquid  . dofetilide (TIKOSYN) 125 MCG capsule  . furosemide (LASIX) 40 MG tablet  . ipratropium (ATROVENT) 0.06 % nasal spray  . ketotifen (ALAWAY) 0.025 % ophthalmic solution  . loratadine (CLARITIN) 10 MG tablet  . Menthol, Topical Analgesic, (BIOFREEZE EX)  . Multiple Vitamin (MULTIVITAMIN WITH MINERALS) TABS tablet  . naproxen sodium (ALEVE) 220 MG tablet  . Omega-3 Fatty Acids (FISH OIL) 1200 MG CAPS  . omeprazole (PRILOSEC OTC) 20 MG tablet  . potassium chloride (K-DUR) 10 MEQ tablet  . rosuvastatin (CRESTOR) 10 MG tablet  . triamcinolone (NASACORT) 55 MCG/ACT nasal inhaler   No current facility-administered medications for this encounter.     [facility] Short Stay Center/Anesthesiology Phone [phone] 07/31/2018 2:37 PM

## 2018-08-03 ENCOUNTER — Ambulatory Visit (INDEPENDENT_AMBULATORY_CARE_PROVIDER_SITE_OTHER): Payer: Medicare Other | Admitting: *Deleted

## 2018-08-03 DIAGNOSIS — Z23 Encounter for immunization: Secondary | ICD-10-CM | POA: Diagnosis not present

## 2018-08-03 MED ORDER — POTASSIUM CHLORIDE ER 10 MEQ PO TBCR: 10.0000 meq | EXTENDED_RELEASE_TABLET | Freq: Two times a day (BID) | ORAL | 3 refills | Status: DC

## 2018-08-03 NOTE — Addendum Note (Signed)
Addended by: Alysia Penna A on: 08/03/2018 01:50 PM   Modules accepted: Orders

## 2018-08-03 NOTE — Telephone Encounter (Signed)
This was sent in  

## 2018-08-03 NOTE — Progress Notes (Signed)
Per orders of Dr. Sarajane Jews, injection of influenza  given by Westley Hummer. Patient tolerated injection well.

## 2018-08-03 NOTE — Telephone Encounter (Signed)
Dr. Sarajane Jews please advise on refill of potassium.  I do not see where we have filled this for her since 2017.  thanks

## 2018-08-04 ENCOUNTER — Other Ambulatory Visit: Payer: Self-pay | Admitting: *Deleted

## 2018-08-04 MED ORDER — POTASSIUM CHLORIDE ER 10 MEQ PO TBCR: 10.0000 meq | EXTENDED_RELEASE_TABLET | Freq: Two times a day (BID) | ORAL | 0 refills | Status: DC

## 2018-08-04 NOTE — Telephone Encounter (Signed)
Rx sent to the [facility] on [address].  Sent to mail order on 08/03/18.

## 2018-08-04 NOTE — Telephone Encounter (Signed)
[facility] Call [place] Phone Number [phone] Patient Name Barbara [name] Patient DOB [birth year] Call Type Message Only Information Provided Reason for Call Medication Question / Request Initial Comment Caller is Letricia, needing her potassium 10mg  twice a day refilled. No Sx, but needs refilled.

## 2018-08-04 NOTE — Telephone Encounter (Signed)
Pt calling stating that she need  5-7 Potassium pills sent to her pharmacy locally until it comes in the mail    [facility] [place] [place]  [address] [place] [address]

## 2018-08-07 ENCOUNTER — Encounter (HOSPITAL_COMMUNITY)
Admission: RE | Disposition: A | Discharge status: Home / Self Care | Payer: Self-pay | Source: Ambulatory Visit | Attending: Otolaryngology

## 2018-08-07 ENCOUNTER — Ambulatory Visit (HOSPITAL_COMMUNITY)
Admission: RE | Admit: 2018-08-07 | Discharge: 2018-08-07 | Disposition: A | Discharge status: Home / Self Care | Payer: Medicare Other | Source: Ambulatory Visit | Attending: Otolaryngology | Admitting: Otolaryngology

## 2018-08-07 ENCOUNTER — Ambulatory Visit (HOSPITAL_COMMUNITY): Payer: Medicare Other | Admitting: Vascular Surgery

## 2018-08-07 ENCOUNTER — Encounter (HOSPITAL_COMMUNITY): Payer: Self-pay

## 2018-08-07 ENCOUNTER — Ambulatory Visit (HOSPITAL_COMMUNITY): Payer: Medicare Other | Admitting: Certified Registered Nurse Anesthetist

## 2018-08-07 ENCOUNTER — Other Ambulatory Visit: Payer: Self-pay

## 2018-08-07 DIAGNOSIS — R011 Cardiac murmur, unspecified: Secondary | ICD-10-CM | POA: Insufficient documentation

## 2018-08-07 DIAGNOSIS — K219 Gastro-esophageal reflux disease without esophagitis: Secondary | ICD-10-CM | POA: Diagnosis not present

## 2018-08-07 DIAGNOSIS — Z882 Allergy status to sulfonamides status: Secondary | ICD-10-CM | POA: Insufficient documentation

## 2018-08-07 DIAGNOSIS — Z955 Presence of coronary angioplasty implant and graft: Secondary | ICD-10-CM | POA: Insufficient documentation

## 2018-08-07 DIAGNOSIS — Z87891 Personal history of nicotine dependence: Secondary | ICD-10-CM | POA: Diagnosis not present

## 2018-08-07 DIAGNOSIS — Z79899 Other long term (current) drug therapy: Secondary | ICD-10-CM | POA: Diagnosis not present

## 2018-08-07 DIAGNOSIS — Z7901 Long term (current) use of anticoagulants: Secondary | ICD-10-CM | POA: Diagnosis not present

## 2018-08-07 DIAGNOSIS — M1990 Unspecified osteoarthritis, unspecified site: Secondary | ICD-10-CM | POA: Insufficient documentation

## 2018-08-07 DIAGNOSIS — Z884 Allergy status to anesthetic agent status: Secondary | ICD-10-CM | POA: Diagnosis not present

## 2018-08-07 DIAGNOSIS — J3801 Paralysis of vocal cords and larynx, unilateral: Secondary | ICD-10-CM | POA: Insufficient documentation

## 2018-08-07 DIAGNOSIS — Z885 Allergy status to narcotic agent status: Secondary | ICD-10-CM | POA: Insufficient documentation

## 2018-08-07 DIAGNOSIS — I110 Hypertensive heart disease with heart failure: Secondary | ICD-10-CM | POA: Insufficient documentation

## 2018-08-07 DIAGNOSIS — E785 Hyperlipidemia, unspecified: Secondary | ICD-10-CM | POA: Diagnosis not present

## 2018-08-07 DIAGNOSIS — I509 Heart failure, unspecified: Secondary | ICD-10-CM | POA: Insufficient documentation

## 2018-08-07 DIAGNOSIS — R490 Dysphonia: Secondary | ICD-10-CM | POA: Insufficient documentation

## 2018-08-07 DIAGNOSIS — I2510 Atherosclerotic heart disease of native coronary artery without angina pectoris: Secondary | ICD-10-CM | POA: Diagnosis not present

## 2018-08-07 DIAGNOSIS — Z8249 Family history of ischemic heart disease and other diseases of the circulatory system: Secondary | ICD-10-CM | POA: Diagnosis not present

## 2018-08-07 DIAGNOSIS — R001 Bradycardia, unspecified: Secondary | ICD-10-CM | POA: Diagnosis not present

## 2018-08-07 DIAGNOSIS — Z888 Allergy status to other drugs, medicaments and biological substances status: Secondary | ICD-10-CM | POA: Diagnosis not present

## 2018-08-07 DIAGNOSIS — I272 Pulmonary hypertension, unspecified: Secondary | ICD-10-CM | POA: Insufficient documentation

## 2018-08-07 DIAGNOSIS — I4891 Unspecified atrial fibrillation: Secondary | ICD-10-CM | POA: Insufficient documentation

## 2018-08-07 HISTORY — PX: MICROLARYNGOSCOPY W/VOCAL CORD INJECTION: SHX2665

## 2018-08-07 LAB — PROTIME-INR
INR: 1.18
Prothrombin Time: 14.9 s (ref 11.4–15.2)

## 2018-08-07 SURGERY — MICROLARYNGOSCOPY, WITH VOCAL CORD INJECTION
Anesthesia: General | Site: Throat

## 2018-08-07 MED ORDER — LIDOCAINE-EPINEPHRINE 1 %-1:100000 IJ SOLN
INTRAMUSCULAR | Status: AC
  Filled 2018-08-07: qty 1

## 2018-08-07 MED ORDER — PROPOFOL 500 MG/50ML IV EMUL
INTRAVENOUS | Status: DC | PRN
  Administered 2018-08-07: 100 ug/kg/min via INTRAVENOUS

## 2018-08-07 MED ORDER — EPHEDRINE SULFATE-NACL 50-0.9 MG/10ML-% IV SOSY
PREFILLED_SYRINGE | INTRAVENOUS | Status: DC | PRN
  Administered 2018-08-07: 10 mg via INTRAVENOUS

## 2018-08-07 MED ORDER — ONDANSETRON HCL 4 MG/2ML IJ SOLN
4.0000 mg | Freq: Once | INTRAMUSCULAR | Status: AC
  Administered 2018-08-07: 4 mg via INTRAVENOUS

## 2018-08-07 MED ORDER — FENTANYL CITRATE (PF) 100 MCG/2ML IJ SOLN
INTRAMUSCULAR | Status: DC | PRN
  Administered 2018-08-07: 100 ug via INTRAVENOUS

## 2018-08-07 MED ORDER — MIDAZOLAM HCL 2 MG/2ML IJ SOLN
INTRAMUSCULAR | Status: AC
  Filled 2018-08-07: qty 2

## 2018-08-07 MED ORDER — LACTATED RINGERS IV SOLN: INTRAVENOUS | Status: DC

## 2018-08-07 MED ORDER — ONDANSETRON HCL 4 MG/2ML IJ SOLN
INTRAMUSCULAR | Status: AC
  Filled 2018-08-07: qty 2

## 2018-08-07 MED ORDER — PROPOFOL 10 MG/ML IV BOLUS
INTRAVENOUS | Status: DC | PRN
  Administered 2018-08-07: 140 mg via INTRAVENOUS
  Administered 2018-08-07: 60 mg via INTRAVENOUS

## 2018-08-07 MED ORDER — ONDANSETRON HCL 4 MG/2ML IJ SOLN
INTRAMUSCULAR | Status: DC | PRN
  Administered 2018-08-07: 4 mg via INTRAVENOUS

## 2018-08-07 MED ORDER — ROCURONIUM BROMIDE 10 MG/ML (PF) SYRINGE
PREFILLED_SYRINGE | INTRAVENOUS | Status: DC | PRN
  Administered 2018-08-07: 15 mg via INTRAVENOUS

## 2018-08-07 MED ORDER — LACTATED RINGERS IV SOLN
INTRAVENOUS | Status: DC
  Administered 2018-08-07: 07:00:00 via INTRAVENOUS

## 2018-08-07 MED ORDER — SUGAMMADEX SODIUM 200 MG/2ML IV SOLN
INTRAVENOUS | Status: DC | PRN
  Administered 2018-08-07: 200 mg via INTRAVENOUS

## 2018-08-07 MED ORDER — PROMETHAZINE HCL 25 MG/ML IJ SOLN: 6.2500 mg | INTRAMUSCULAR | Status: DC | PRN

## 2018-08-07 MED ORDER — FENTANYL CITRATE (PF) 250 MCG/5ML IJ SOLN
INTRAMUSCULAR | Status: AC
  Filled 2018-08-07: qty 5

## 2018-08-07 MED ORDER — PROPOFOL 10 MG/ML IV BOLUS
INTRAVENOUS | Status: AC
  Filled 2018-08-07: qty 40

## 2018-08-07 MED ORDER — OXYMETAZOLINE HCL 0.05 % NA SOLN
NASAL | Status: AC
  Filled 2018-08-07: qty 15

## 2018-08-07 MED ORDER — FENTANYL CITRATE (PF) 100 MCG/2ML IJ SOLN: 25.0000 ug | INTRAMUSCULAR | Status: DC | PRN

## 2018-08-07 MED ORDER — MEPERIDINE HCL 50 MG/ML IJ SOLN: 6.2500 mg | INTRAMUSCULAR | Status: DC | PRN

## 2018-08-07 MED ORDER — GLYCOPYRROLATE PF 0.2 MG/ML IJ SOSY
PREFILLED_SYRINGE | INTRAMUSCULAR | Status: DC | PRN
  Administered 2018-08-07: .2 mg via INTRAVENOUS

## 2018-08-07 MED ORDER — EPINEPHRINE PF 1 MG/ML IJ SOLN
INTRAMUSCULAR | Status: DC | PRN
  Administered 2018-08-07: 1 mg

## 2018-08-07 SURGICAL SUPPLY — 29 items
CANISTER SUCT 3000ML PPV (MISCELLANEOUS) ×3 IMPLANT
CONT SPEC 4OZ CLIKSEAL STRL BL (MISCELLANEOUS) IMPLANT
COVER BACK TABLE 60X90IN (DRAPES) ×3 IMPLANT
COVER MAYO STAND STRL (DRAPES) ×3 IMPLANT
COVER WAND RF STERILE (DRAPES) ×3 IMPLANT
CRADLE DONUT ADULT HEAD (MISCELLANEOUS) IMPLANT
DRAPE HALF SHEET 40X57 (DRAPES) ×3 IMPLANT
GAUZE SPONGE 4X4 12PLY STRL (GAUZE/BANDAGES/DRESSINGS) ×3 IMPLANT
GLOVE BIO SURGEON STRL SZ7.5 (GLOVE) ×3 IMPLANT
GOWN STRL REUS W/ TWL LRG LVL3 (GOWN DISPOSABLE) IMPLANT
GOWN STRL REUS W/TWL LRG LVL3 (GOWN DISPOSABLE)
GUARD TEETH (MISCELLANEOUS) IMPLANT
KIT BASIN OR (CUSTOM PROCEDURE TRAY) ×3 IMPLANT
KIT PROLARN PLUS GEL W/NDL (Miscellaneous) ×2 IMPLANT
KIT TURNOVER KIT B (KITS) ×3 IMPLANT
NDL HYPO 25GX1X1/2 BEV (NEEDLE) IMPLANT
NDL TRANS ORAL INJECTION (NEEDLE) IMPLANT
NEEDLE HYPO 25GX1X1/2 BEV (NEEDLE) IMPLANT
NEEDLE TRANS ORAL INJECTION (NEEDLE) ×3 IMPLANT
NS IRRIG 1000ML POUR BTL (IV SOLUTION) ×3 IMPLANT
PAD ARMBOARD 7.5X6 YLW CONV (MISCELLANEOUS) ×6 IMPLANT
PATTIES SURGICAL .5 X1 (DISPOSABLE) IMPLANT
PATTIES SURGICAL .5 X3 (DISPOSABLE) IMPLANT
SOLUTION ANTI FOG 6CC (MISCELLANEOUS) ×3 IMPLANT
SURGILUBE 2OZ TUBE FLIPTOP (MISCELLANEOUS) IMPLANT
TOWEL OR 17X24 6PK STRL BLUE (TOWEL DISPOSABLE) ×6 IMPLANT
TUBE CONNECTING 12'X1/4 (SUCTIONS) ×1
TUBE CONNECTING 12X1/4 (SUCTIONS) ×2 IMPLANT
WATER STERILE IRR 1000ML POUR (IV SOLUTION) IMPLANT

## 2018-08-07 NOTE — Anesthesia Procedure Notes (Deleted)
Anesthesia Regional Block:  Pre-Anesthetic Checklist: ,, timeout performed, Correct Patient, Correct Site, Correct Laterality, Correct Procedure, Correct Position, site marked, Risks and benefits discussed,  Surgical consent,  Pre-op evaluation,  At surgeon's request and post-op pain management   Prep: chloraprep       Needles:  Injection technique: Single-shot  Needle Type: Echogenic Stimulator Needle     Needle Length: 9cm  Needle Gauge: 21     Additional Needles:   Procedures:,,,, ultrasound used (permanent image in chart),,,,  Narrative:  Injection made incrementally with aspirations every 5 mL.  Performed by: Personally  Anesthesiologist: Effie Berkshire, MD  Additional Notes: Patient tolerated the procedure well. Local anesthetic introduced in an incremental fashion under minimal resistance after negative aspirations. No paresthesias were elicited. After completion of the procedure, no acute issues were identified and patient continued to be monitored by RN.

## 2018-08-07 NOTE — Anesthesia Procedure Notes (Signed)
Date/Time: 08/07/2018 9:52 AM Performed by: Doran Clay, CRNA Pre-anesthesia Checklist: Patient identified, Emergency Drugs available, Suction available, Patient being monitored and Timeout performed Patient Re-evaluated:Patient Re-evaluated prior to induction Oxygen Delivery Method: Circle system utilized Preoxygenation: Pre-oxygenation with 100% oxygen Induction Type: IV induction Ventilation: Mask ventilation without difficulty Dental Injury: Teeth and Oropharynx as per pre-operative assessment  Comments: IV induction.  Positive BMV.  Bed turned to surgeon.  DL with rigid scope and jet ventilation started.

## 2018-08-07 NOTE — Brief Op Note (Signed)
08/07/2018  10:10 AM  PATIENT:  Barbara [name]  77 y.o. female  PRE-OPERATIVE DIAGNOSIS:  Dysphonia, left vocal fold paresis  POST-OPERATIVE DIAGNOSIS:  Dysphonia, left vocal fold paresis  PROCEDURE:  Procedure(s) with comments: MICROLARYNGOSCOPY WITH VOCAL CORD INJECTION OF PROLARYN (N/A) - JET VENTILATION  SURGEON:  Surgeon(s) and Role:    Melida Quitter, MD - Primary  PHYSICIAN ASSISTANT:   ASSISTANTS: none   ANESTHESIA:   general  EBL: None  BLOOD ADMINISTERED:none  DRAINS: none   LOCAL MEDICATIONS USED:  NONE  SPECIMEN:  No Specimen  DISPOSITION OF SPECIMEN:  N/A  COUNTS:  YES  TOURNIQUET:  * No tourniquets in log *  DICTATION: .Other Dictation: Dictation Number [id number]  PLAN OF CARE: Discharge to home after PACU  PATIENT DISPOSITION:  PACU - hemodynamically stable.   Delay start of Pharmacological VTE agent (>24hrs) due to surgical blood loss or risk of bleeding: no

## 2018-08-07 NOTE — Anesthesia Preprocedure Evaluation (Addendum)
Anesthesia Evaluation  Patient identified by MRN, date of birth, ID band Patient awake    Reviewed: Allergy & Precautions, NPO status , Patient's Chart, lab work & pertinent test results  History of Anesthesia Complications (+) PONV  Airway Mallampati: II  TM Distance: >3 FB Neck ROM: Full    Dental  (+) Teeth Intact, Dental Advisory Given   Pulmonary former smoker,    breath sounds clear to auscultation       Cardiovascular hypertension, + CAD, + Cardiac Stents and +CHF  + dysrhythmias Atrial Fibrillation  Rhythm:Regular Rate:Normal     Neuro/Psych  Headaches,    GI/Hepatic Neg liver ROS, GERD  Medicated,  Endo/Other  negative endocrine ROS  Renal/GU negative Renal ROS     Musculoskeletal  (+) Arthritis ,   Abdominal Normal abdominal exam  (+)   Peds  Hematology negative hematology ROS (+)   Anesthesia Other Findings - HLD  Reproductive/Obstetrics                            Lab Results  Component Value Date   WBC 5.3 07/30/2018   HGB 13.0 07/30/2018   HCT 40.9 07/30/2018   MCV 92.3 07/30/2018   PLT 224 07/30/2018   Lab Results  Component Value Date   CREATININE 1.10 (H) 07/30/2018   BUN 24 (H) 07/30/2018   NA 140 07/30/2018   K 4.0 07/30/2018   CL 104 07/30/2018   CO2 26 07/30/2018   Lab Results  Component Value Date   INR 1.18 08/07/2018   INR 1.02 02/13/2018   EKG: sinus bradycardia.   Anesthesia Physical Anesthesia Plan  ASA: III  Anesthesia Plan: General   Post-op Pain Management:    Induction: Intravenous  PONV Risk Score and Plan: 4 or greater and Ondansetron, Dexamethasone and Treatment may vary due to age or medical condition  Airway Management Planned: Natural Airway  Additional Equipment: None  Intra-op Plan:   Post-operative Plan:   Informed Consent: I have reviewed the patients History and Physical, chart, labs and discussed the procedure  including the risks, benefits and alternatives for the proposed anesthesia with the patient or authorized representative who has indicated his/her understanding and acceptance.   Dental advisory given  Plan Discussed with: CRNA  Anesthesia Plan Comments: (Jet Ventilation)       Anesthesia Quick Evaluation

## 2018-08-07 NOTE — Transfer of Care (Signed)
Immediate Anesthesia Transfer of Care Note  Patient: Barbara [name]  Procedure(s) Performed: MICROLARYNGOSCOPY WITH VOCAL CORD INJECTION OF PROLARYN (N/A Throat)  Patient Location: PACU  Anesthesia Type:General  Level of Consciousness: awake, alert  and oriented  Airway & Oxygen Therapy: Patient Spontanous Breathing and Patient connected to nasal cannula oxygen  Post-op Assessment: Report given to RN and Post -op Vital signs reviewed and stable  Post vital signs: Reviewed and stable  Last Vitals:  Vitals Value Taken Time  BP 146/62 08/07/2018 10:22 AM  Temp    Pulse 74 08/07/2018 10:25 AM  Resp 12 08/07/2018 10:25 AM  SpO2 100 % 08/07/2018 10:25 AM  Vitals shown include unvalidated device data.  Last Pain:  Vitals:   08/07/18 0714  TempSrc:   PainSc: 0-No pain         Complications: No apparent anesthesia complications

## 2018-08-07 NOTE — Op Note (Signed)
NAME: Barbara [name], CZERWINSKI MEDICAL RECORD [id number] ACCOUNT [id number] DATE OF BIRTH:02-07-1941 FACILITY: [facility] LOCATION: [facility] PHYSICIAN:Abdulkarim Eberlin DRedmond Baseman, MD  OPERATIVE REPORT  DATE OF PROCEDURE:  08/07/2018  PREOPERATIVE DIAGNOSIS:  Left vocal fold paresis and dysphonia.  POSTOPERATIVE DIAGNOSIS:  Left vocal fold paresis and dysphonia.  PROCEDURE:  Suspended microdirect laryngoscopy with Prolaryn injection.  SURGEON:  Melida Quitter, MD  ANESTHESIA:  General jet ventilation.  COMPLICATIONS:  None.  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  She presents for  additional injection.  FINDINGS:  Vocal folds are normal in appearance with no mass or ulceration.  Prolaryn 0.4 mL were injected in the left vocal fold and 0.2 mL in the right vocal fold.  DESCRIPTION OF PROCEDURE:  The patient was identified in the holding room, informed consent having been obtained including discussion of risks, benefits and alternatives, the patient was brought to the operative suite to the operating room and placed in  supine position.  Anesthesia was induced and the patient was maintained via mask ventilation.  The eyes were taped closed and the bed was turned 90 degrees from anesthesia.  A tooth guard was placed over the upper teeth and a Stortz laryngoscope was  placed into the supraglottic position and suspended to the Mayo stand using the Lewy arm.  Jet Venturi ventilation was initiated.  A 0 degree telescope was used to make a preoperative photograph.  The operating microscope was brought into the field and  Prolaryn was then injected in the left vocal fold first and then the right vocal fold with a total of 0.4 mL in the left side and 0.2 mL in the right side.  Approximately two-thirds of the injection was  placed more posteriorly and one-third more  anteriorly on each side.  After this was completed, the larynx was suctioned and a postoperative photograph was made.  The laryngoscope was taken out of suspension and removed from the patient's mouth as the area was suctioned.  The tooth guard was  removed.  She was returned to mask ventilation.  The bed was turned back to anesthesia for wakeup.  She was moved to recovery room in stable condition.  TN/NUANCE  D:08/07/2018 T:08/07/2018 JOB:[id number]

## 2018-08-07 NOTE — Anesthesia Postprocedure Evaluation (Signed)
Anesthesia Post Note  Patient: Barbara [name]  Procedure(s) Performed: MICROLARYNGOSCOPY WITH VOCAL CORD INJECTION OF PROLARYN (N/A Throat)     Patient location during evaluation: PACU Anesthesia Type: General Level of consciousness: awake and alert Pain management: pain level controlled Vital Signs Assessment: post-procedure vital signs reviewed and stable Respiratory status: spontaneous breathing, nonlabored ventilation, respiratory function stable and patient connected to nasal cannula oxygen Cardiovascular status: blood pressure returned to baseline and stable Postop Assessment: no apparent nausea or vomiting Anesthetic complications: no    Last Vitals:  Vitals:   08/07/18 1244 08/07/18 1329  BP: (!) 156/56 (!) 159/55  Pulse: (!) 55 (!) 55  Resp: 12 16  Temp: (!) 36.1 C   SpO2: 98% 99%    Last Pain:  Vitals:   08/07/18 1329  TempSrc:   PainSc: 0-No pain                 Effie Berkshire

## 2018-08-07 NOTE — H&P (Signed)
Barbara [name] is an 77 y.o. female.   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  She presents for revision injection.  Past Medical History:  Diagnosis Date  . A-fib ([place])   . Allergy   . CAD (coronary artery disease)    sees Dr. Shelva Majestic  cardiac stents - 2000  . CHF (congestive heart failure) ([place])   . Colon polyps   . Dyspnea    02/12/18 " when my heart gets out of rhythm, it has not been out of rhythym- since I have been on Tikosyn (11/2017)  . Dysrhythmia    afib fib  . GERD (gastroesophageal reflux disease)    takes OTC- Omeprazole- prn  . Heart murmur   . History of stress test    show normal perfusion without scar or ischemia, post EF 68%  . Hx of echocardiogram    show an EF 55%-60% range with grade 1 diastolic dysfunction, she had mitral anular calcification with mild MR, moderate LA dilation and mild pulmonary hypertension with a PA estimated pressure of 95mm  . Hyperlipidemia   . Hypertension   . Osteoarthritis   . Pneumonia    hx of 2015   . PONV (postoperative nausea and vomiting)     Past Surgical History:  Procedure Laterality Date  . CARDIAC CATHETERIZATION     11/2017  . cardiac stents  2000  . COLONOSCOPY  01-05-14   per Dr. Teena Irani, clear, no repeats needed   . CORONARY STENT PLACEMENT  2000   in [facility]  . DIRECT LARYNGOSCOPY WITH RADIAESSE INJECTION N/A 02/13/2018   Procedure: DIRECT LARYNGOSCOPY WITH RADIAESSE INJECTION;  Surgeon: Melida Quitter, MD;  Location: [facility];  Service: ENT;  Laterality: N/A;  . KNEE ARTHROSCOPY Left 01/06/2015   Procedure: LEFT KNEE ARTHROSCOPY, abrasion chondroplasty of the medial femerol condryl,medial and lateral menisectomy, microfracture , synovectomy of the suprpatellar pouch;  Surgeon: Latanya Maudlin, MD;  Location: [facility];  Service: Orthopedics;  Laterality:  Left;  Marland Kitchen VAGINAL HYSTERECTOMY  1971    Family History  Problem Relation Age of Onset  . Cancer Maternal Grandmother   . Heart disease Maternal Grandfather    Social History:  reports that she quit smoking about 21 years ago. She quit after 40.00 years of use. She has never used smokeless tobacco. She reports that she does not drink alcohol or use drugs.  Allergies:  Allergies  Allergen Reactions  . Lidocaine Anaphylaxis  . Morphine Other (See Comments)    "Body shuts down"  . Procaine Hcl Rash and Other (See Comments)    "Anything with 'caine' in it " [ANAPHYLAXIS TO LIDOCAINE]  . Sulfonamide Derivatives Hives  . Vytorin [Ezetimibe-Simvastatin] Other (See Comments)    UNSPECIFIED REACTION   . Tape Itching    Medications Prior to Admission  Medication Sig Dispense Refill  . apixaban (ELIQUIS) 5 MG TABS tablet Take 1 tablet (5 mg total) by mouth 2 (two) times daily. 60 tablet 0  . candesartan (ATACAND) 8 MG tablet Take 1 tablet (8 mg total) by mouth daily. 90 tablet 3  . Cholecalciferol (VITAMIN D3) 2000 units TABS Take 2,000 Units by mouth daily.    . Cyanocobalamin (VITAMIN B-12 PO) Place 1 mL under the tongue 2 (two) times a week.    Marland Kitchen dextromethorphan (DELSYM) 30 MG/5ML liquid Take 15 mg  by mouth daily as needed for cough.     . dofetilide (TIKOSYN) 125 MCG capsule Take 1 capsule (125 mcg total) by mouth 2 (two) times daily. 180 capsule 2  . furosemide (LASIX) 40 MG tablet Take 1 tablet (40 mg total) by mouth daily. 30 tablet 3  . ketotifen (ALAWAY) 0.025 % ophthalmic solution Place 1 drop into both eyes daily as needed (for allergies).    . loratadine (CLARITIN) 10 MG tablet Take 10 mg by mouth daily as needed for allergies.     . Menthol, Topical Analgesic, (BIOFREEZE EX) Apply 1 application topically 3 (three) times daily.    . Multiple Vitamin (MULTIVITAMIN WITH MINERALS) TABS tablet Take 1 tablet by mouth daily.    . naproxen sodium (ALEVE) 220 MG tablet Take 220 mg by  mouth at bedtime.    . Omega-3 Fatty Acids (FISH OIL) 1200 MG CAPS Take 1,200 mg by mouth daily.     . potassium chloride (K-DUR) 10 MEQ tablet Take 1 tablet (10 mEq total) by mouth 2 (two) times daily. 14 tablet 0  . rosuvastatin (CRESTOR) 10 MG tablet TAKE 1 TABLET DAILY (Patient taking differently: Take 10 mg by mouth daily. ) 90 tablet 1  . triamcinolone (NASACORT) 55 MCG/ACT nasal inhaler Place 1 spray into both nostrils daily as needed (for allergies).     Marland Kitchen desoximetasone (TOPICORT) 0.25 % cream APPLY 1 APPLICATION TOPICALLY 2 TIMES DAILY (Patient taking differently: Apply 1 application topically 2 (two) times daily as needed (for itching). ) 60 g 5  . ipratropium (ATROVENT) 0.06 % nasal spray Place 2 sprays into both nostrils daily as needed for rhinitis.   5  . omeprazole (PRILOSEC OTC) 20 MG tablet Take 20 mg by mouth daily as needed (for acid reflux).       Results for orders placed or performed during the hospital encounter of 08/07/18 (from the past 48 hour(s))  PT- INR Day of Surgery     Status: None   Collection Time: 08/07/18  7:10 AM  Result Value Ref Range   Prothrombin Time 14.9 11.4 - 15.2 seconds   INR 1.18     Comment: Performed at [facility], [place] [address], [place], [place] [address]   No results found.  Review of Systems  All other systems reviewed and are negative.   Blood pressure (!) 189/53, pulse (!) 57, temperature 97.7 F (36.5 C), temperature source Oral, resp. rate 20, height 5\' 8"  (1.727 m), weight 70 kg, SpO2 100 %. Physical Exam  Constitutional: She is oriented to person, place, and time. She appears well-developed and well-nourished. No distress.  HENT:  Head: Normocephalic and atraumatic.  Right Ear: External ear normal.  Left Ear: External ear normal.  Nose: Nose normal.  Mouth/Throat: Oropharynx is clear and moist.  Mild scratchy dysphonia.  Eyes: Pupils are equal, round, and reactive to light. Conjunctivae and EOM are normal.  Neck:  Normal range of motion. Neck supple.  Cardiovascular: Normal rate.  Respiratory: Effort normal.  Musculoskeletal: Normal range of motion.  Neurological: She is alert and oriented to person, place, and time. No cranial nerve deficit.  Skin: Skin is warm and dry.  Psychiatric: She has a normal mood and affect. Her behavior is normal. Judgment and thought content normal.     Assessment/Plan Dysphonia To OR for SMDL with Prolaryn injections.  Melida Quitter, MD 08/07/2018, 9:10 AM

## 2018-08-10 ENCOUNTER — Encounter (HOSPITAL_COMMUNITY): Payer: Self-pay | Admitting: Otolaryngology

## 2018-08-10 MED ORDER — POTASSIUM CHLORIDE ER 10 MEQ PO CPCR: 10.0000 meq | ORAL_CAPSULE | Freq: Two times a day (BID) | ORAL | 3 refills | Status: DC

## 2018-08-10 NOTE — Telephone Encounter (Signed)
Patient says she was supposed to get potassium ER capsules generic for micro-K because she can't swallow the solid white pills. Please send short supply to [facility] and [facility].

## 2018-08-10 NOTE — Telephone Encounter (Signed)
Rx changed and sent to pharmacy.  °

## 2018-08-10 NOTE — Addendum Note (Signed)
Addended by: Beckie Busing on: 08/10/2018 05:19 PM   Modules accepted: Orders

## 2018-08-12 NOTE — Addendum Note (Signed)
Addendum  created 08/12/18 0937 by Effie Berkshire, MD   Delete clinical note, Intraprocedure Blocks edited, Order Canceled from Note

## 2018-08-17 ENCOUNTER — Ambulatory Visit: Payer: Medicare Other

## 2018-08-17 NOTE — Progress Notes (Deleted)
Subjective:   Barbara [name] is a 77 y.o. female who presents for Medicare Annual (Subsequent) preventive examination.  Reports health as  Diet Glucose elevated   Exercise  Former smoker with quit date of 69  40 pack years   There are no preventive care reminders to display for this patient.  Colonoscopy 04/2014 Mammogram 08/2017  Bone Density 12/08/2017  Educate regarding shingrix        Objective:     Vitals: There were no vitals taken for this visit.  There is no height or weight on file to calculate BMI.  Advanced Directives 07/30/2018 02/13/2018 11/17/2017 01/06/2015 01/03/2015  Does Patient Have a Medical Advance Directive? Yes Yes No - Yes  Type of Advance Directive - Living will - - [place];Living will  Does patient want to make changes to medical advance directive? - No - Patient declined - - -  Copy of [place] in Chart? - - - (No Data) No - copy requested  Would patient like information on creating a medical advance directive? - - No - Patient declined - -    Tobacco Social History   Tobacco Use  Smoking Status Former Smoker  . Years: 40.00  . Last attempt to quit: 11/04/1996  . Years since quitting: 21.7  Smokeless Tobacco Never Used     Counseling given: Not Answered   Clinical Intake:    Past Medical History:  Diagnosis Date  . A-fib ([place])   . Allergy   . CAD (coronary artery disease)    sees Dr. Shelva Majestic  cardiac stents - 2000  . CHF (congestive heart failure) ([place])   . Colon polyps   . Dyspnea    02/12/18 " when my heart gets out of rhythm, it has not been out of rhythym- since I have been on Tikosyn (11/2017)  . Dysrhythmia    afib fib  . GERD (gastroesophageal reflux disease)    takes OTC- Omeprazole- prn  . Heart murmur   . History of stress test    show normal perfusion without scar or ischemia, post EF 68%  . Hx of echocardiogram    show an EF 55%-60% range with grade 1 diastolic  dysfunction, she had mitral anular calcification with mild MR, moderate LA dilation and mild pulmonary hypertension with a PA estimated pressure of 63mm  . Hyperlipidemia   . Hypertension   . Osteoarthritis   . Pneumonia    hx of 2015   . PONV (postoperative nausea and vomiting)    Past Surgical History:  Procedure Laterality Date  . CARDIAC CATHETERIZATION     11/2017  . cardiac stents  2000  . COLONOSCOPY  01-05-14   per Dr. Teena Irani, clear, no repeats needed   . CORONARY STENT PLACEMENT  2000   in [facility]  . DIRECT LARYNGOSCOPY WITH RADIAESSE INJECTION N/A 02/13/2018   Procedure: DIRECT LARYNGOSCOPY WITH RADIAESSE INJECTION;  Surgeon: Melida Quitter, MD;  Location: [facility];  Service: ENT;  Laterality: N/A;  . KNEE ARTHROSCOPY Left 01/06/2015   Procedure: LEFT KNEE ARTHROSCOPY, abrasion chondroplasty of the medial femerol condryl,medial and lateral menisectomy, microfracture , synovectomy of the suprpatellar pouch;  Surgeon: Latanya Maudlin, MD;  [facility];  Service: Orthopedics;  Laterality: Left;  Marland Kitchen MICROLARYNGOSCOPY W/VOCAL CORD INJECTION N/A 08/07/2018   Procedure: MICROLARYNGOSCOPY WITH VOCAL CORD INJECTION OF PROLARYN;  Surgeon: Melida Quitter, MD;  Location: [facility];  Service: ENT;  Laterality: N/A;  JET VENTILATION  .  VAGINAL HYSTERECTOMY  1971   Family History  Problem Relation Age of Onset  . Cancer Maternal Grandmother   . Heart disease Maternal Grandfather    Social History   Socioeconomic History  . Marital status: Married    Spouse name: Not on file  . Number of children: Not on file  . Years of education: Not on file  . Highest education level: Not on file  Occupational History  . Not on file  Social Needs  . Financial resource strain: Not on file  . Food insecurity:    Worry: Not on file    Inability: Not on file  . Transportation needs:    Medical: Not on file    Non-medical: Not on file  Tobacco Use  . Smoking status: Former Smoker    Years: 40.00    Last  attempt to quit: 11/04/1996    Years since quitting: 21.7  . Smokeless tobacco: Never Used  Substance and Sexual Activity  . Alcohol use: No    Alcohol/week: 0.0 standard drinks  . Drug use: No  . Sexual activity: Not on file  Lifestyle  . Physical activity:    Days per week: Not on file    Minutes per session: Not on file  . Stress: Not on file  Relationships  . Social connections:    Talks on phone: Not on file    Gets together: Not on file    Attends religious service: Not on file    Active member of club or organization: Not on file    Attends meetings of clubs or organizations: Not on file    Relationship status: Not on file  Other Topics Concern  . Not on file  Social History Narrative  . Not on file    Outpatient Encounter Medications as of 08/17/2018  Medication Sig  . apixaban (ELIQUIS) 5 MG TABS tablet Take 1 tablet (5 mg total) by mouth 2 (two) times daily.  . candesartan (ATACAND) 8 MG tablet Take 1 tablet (8 mg total) by mouth daily.  . Cholecalciferol (VITAMIN D3) 2000 units TABS Take 2,000 Units by mouth daily.  . Cyanocobalamin (VITAMIN B-12 PO) Place 1 mL under the tongue 2 (two) times a week.  . desoximetasone (TOPICORT) 0.25 % cream APPLY 1 APPLICATION TOPICALLY 2 TIMES DAILY (Patient taking differently: Apply 1 application topically 2 (two) times daily as needed (for itching). )  . dextromethorphan (DELSYM) 30 MG/5ML liquid Take 15 mg by mouth daily as needed for cough.   . dofetilide (TIKOSYN) 125 MCG capsule Take 1 capsule (125 mcg total) by mouth 2 (two) times daily.  . furosemide (LASIX) 40 MG tablet Take 1 tablet (40 mg total) by mouth daily.  Marland Kitchen ipratropium (ATROVENT) 0.06 % nasal spray Place 2 sprays into both nostrils daily as needed for rhinitis.   Marland Kitchen ketotifen (ALAWAY) 0.025 % ophthalmic solution Place 1 drop into both eyes daily as needed (for allergies).  . loratadine (CLARITIN) 10 MG tablet Take 10 mg by mouth daily as needed for allergies.   .  Menthol, Topical Analgesic, (BIOFREEZE EX) Apply 1 application topically 3 (three) times daily.  . Multiple Vitamin (MULTIVITAMIN WITH MINERALS) TABS tablet Take 1 tablet by mouth daily.  . naproxen sodium (ALEVE) 220 MG tablet Take 220 mg by mouth at bedtime.  . Omega-3 Fatty Acids (FISH OIL) 1200 MG CAPS Take 1,200 mg by mouth daily.   Marland Kitchen omeprazole (PRILOSEC OTC) 20 MG tablet Take 20 mg by mouth daily  as needed (for acid reflux).   . potassium chloride (MICRO-K) 10 MEQ CR capsule Take 1 capsule (10 mEq total) by mouth 2 (two) times daily.  . rosuvastatin (CRESTOR) 10 MG tablet TAKE 1 TABLET DAILY (Patient taking differently: Take 10 mg by mouth daily. )  . triamcinolone (NASACORT) 55 MCG/ACT nasal inhaler Place 1 spray into both nostrils daily as needed (for allergies).    No facility-administered encounter medications on file as of 08/17/2018.     Activities of Daily Living In your present state of health, do you have any difficulty performing the following activities: 07/30/2018 11/17/2017  Hearing? N Y  Vision? Y N  Comment had cataract removed from left eye, but vision is not that clear -  Difficulty concentrating or making decisions? N N  Walking or climbing stairs? Y Y  Comment d/t current knee problem -  Dressing or bathing? N N  Doing errands, shopping? N N  Some recent data might be hidden    Patient Care Team: Laurey Morale, MD as PCP - General Troy Sine, MD as PCP - Cardiology (Cardiology)    Assessment:   This is a routine wellness examination for Shawnelle.  Exercise Activities and Dietary recommendations    Goals   None     Fall Risk Fall Risk  04/25/2016  Falls in the past year? No     Depression Screen PHQ 2/9 Scores 04/25/2016  PHQ - 2 Score 0     Cognitive Function        Immunization History  Administered Date(s) Administered  . Influenza Split 10/25/2011, 10/21/2012  . Influenza Whole 10/10/2009, 10/17/2010  . Influenza, High Dose  Seasonal PF 08/02/2015, 09/04/2016, 08/14/2017, 08/03/2018  . Influenza,inj,Quad PF,6+ Mos 07/12/2013, 08/24/2014  . Influenza-Unspecified 08/04/2014, 08/07/2015  . Pneumococcal Conjugate-13 09/04/2015  . Pneumococcal Polysaccharide-23 10/10/2009  . Pneumococcal-Unspecified 08/07/2015  . Tdap 11/06/2017      Screening Tests Health Maintenance  Topic Date Due  . TETANUS/TDAP  11/07/2027  . INFLUENZA VACCINE  Completed  . DEXA SCAN  Completed  . PNA vac Low Risk Adult  Completed        Plan:      PCP Notes ***  Health Maintenance ***  Abnormal Screens  ***  Referrals  ***  Patient concerns; ***  Nurse Concerns; ***  Next PCP apt ***      I have personally reviewed and noted the following in the patient's chart:   . Medical and social history . Use of alcohol, tobacco or illicit drugs  . Current medications and supplements . Functional ability and status . Nutritional status . Physical activity . Advanced directives . List of other physicians . Hospitalizations, surgeries, and ER visits in previous 12 months . Vitals . Screenings to include cognitive, depression, and falls . Referrals and appointments  In addition, I have reviewed and discussed with patient certain preventive protocols, quality metrics, and best practice recommendations. A written personalized care plan for preventive services as well as general preventive health recommendations were provided to patient.     Wynetta Fines, RN  08/17/2018

## 2018-08-20 ENCOUNTER — Telehealth: Payer: Self-pay

## 2018-08-20 ENCOUNTER — Other Ambulatory Visit: Payer: Self-pay | Admitting: Physician Assistant

## 2018-08-20 NOTE — Telephone Encounter (Signed)
Copied from [place] [id number]. Topic: General - Other >> Aug 20, 2018  8:47 AM Barbara [name] wrote: Reason for CRM:pt says her medication potassium chloride (MICRO-K) 10 MEQ CR capsule [facility] were sent to her damaged and the seal was broken. She says she called management and they said they will give her [name] courtesy refill but they would need another Script from Dr. Sarajane Jews. Also she is saying that they keep sending her the tablet form instead of the capsule. She says the capsules are easier for her to swallow.   I advised her that on the script is does specify that it is capsule, but will send [name] message to confirm with office.   Pt would like [name] couple pills sent to the [place] for the time being until she receives her new pills through the mail.    Please advise

## 2018-08-20 NOTE — Telephone Encounter (Signed)
Dr. Fry please advise. Thanks  

## 2018-08-20 NOTE — Telephone Encounter (Signed)
This is Dr. Evette Georges pt. Dr. Claiborne Billings is the primary cardiologist.

## 2018-08-21 ENCOUNTER — Telehealth: Payer: Self-pay | Admitting: Cardiovascular Disease

## 2018-08-21 NOTE — Telephone Encounter (Signed)
Received call from patient she stated for the past 1 week she has been sob,dizzy.Stated B/P today 131/53,126/56 pulse 51,65.No chest pain.Appointment scheduled with Jory Sims DNP 08/24/18 at 2:00 pm.Advised to go to ED if needed.

## 2018-08-21 NOTE — Telephone Encounter (Signed)
.  Pt c/o BP issue: STAT if pt c/o blurred vision, one-sided weakness or slurred speech  1. What are your last 5 BP readings? Last night 120.56, 131/53  2. Are you having any other symptoms (ex. Dizziness, headache, blurred vision, passed out)? Dizziness, breathing seems to be faster and a little sob  3. What is your BP issue? blood pressure is low

## 2018-08-23 ENCOUNTER — Other Ambulatory Visit: Payer: Self-pay | Admitting: Physician Assistant

## 2018-08-23 NOTE — Progress Notes (Signed)
Cardiology Office Note   Date:  08/24/2018   ID:  Barbara [name], DOB [birth year], MRN [id number]  PCP:  Barbara Morale, MD  Cardiologist: Dr. Jannifer [name] clinic: Barbara Palau, NP/Dr. Rayann Heman  No chief complaint on file.    History of Present Illness: Barbara [name] is a 77 y.o. female who presents for ongoing assessment and management of coronary artery disease with stent to a high-grade LAD stenosis in November 2000, atrial fibrillation followed in the atrial fibrillation clinic, hypertension, hyperlipidemia, chronic lower extremity edema.  She was last seen by Dr. Claiborne [name] on January 13, 2018, she denied any awareness of recurrent atrial fibrillation.  She was continued on diuretic, losartan, or hypertension.  She had no complaints of bleeding on Eliquis.  She was also continued on Tikosyn for maintenance of normal sinus rhythm in the setting of paroxysmal atrial fibrillation.  She was seen in the A. fib clinic by Barbara Palau, NP on 03/17/2018.  At that time, the patient was asymptomatic and tolerating medications well.  She was continued on apixaban for CHADS VASC score of at least 5.  She continues to have DOE, and daytime somnolence. She states she falls asleep during the day as soon as she is still in a quiet place. 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 She is followed by [facility] for this. She has chronic pain in that knee.    Past Medical History:  Diagnosis Date  . A-fib ([place])   . Allergy   . CAD (coronary artery disease)    sees Dr. Shelva Majestic  cardiac stents - 2000  . CHF (congestive heart failure) ([place])   . Colon polyps   . Dyspnea    02/12/18 " when my heart gets out of rhythm, it has not been out of rhythym- since I have been on Tikosyn (11/2017)  . Dysrhythmia    afib fib  . GERD (gastroesophageal reflux disease)    takes OTC- Omeprazole- prn  . Heart murmur   . History of stress test    show normal perfusion without  scar or ischemia, post EF 68%  . Hx of echocardiogram    show an EF 55%-60% range with grade 1 diastolic dysfunction, she had mitral anular calcification with mild MR, moderate LA dilation and mild pulmonary hypertension with a PA estimated pressure of 62mm  . Hyperlipidemia   . Hypertension   . Osteoarthritis   . Pneumonia    hx of 2015   . PONV (postoperative nausea and vomiting)     Past Surgical History:  Procedure Laterality Date  . CARDIAC CATHETERIZATION     11/2017  . cardiac stents  2000  . COLONOSCOPY  01-05-14   per Dr. Teena [name], clear, no repeats needed   . CORONARY STENT PLACEMENT  2000   in LAD  . DIRECT LARYNGOSCOPY WITH RADIAESSE INJECTION N/A 02/13/2018   Procedure: DIRECT LARYNGOSCOPY WITH RADIAESSE INJECTION;  Surgeon: Barbara Quitter, MD;  Location: [facility];  Service: ENT;  Laterality: N/A;  . KNEE ARTHROSCOPY Left 01/06/2015   Procedure: LEFT KNEE ARTHROSCOPY, abrasion chondroplasty of the medial femerol condryl,medial and lateral menisectomy, microfracture , synovectomy of the suprpatellar pouch;  Surgeon: Barbara Maudlin, MD;  Location: [facility];  Service: Orthopedics;  Laterality: Left;  Marland Kitchen MICROLARYNGOSCOPY W/VOCAL CORD INJECTION N/A 08/07/2018   Procedure: MICROLARYNGOSCOPY WITH VOCAL CORD INJECTION OF PROLARYN;  Surgeon: Barbara Quitter, MD;  Location: [facility];  Service: ENT;  Laterality:  N/A;  JET VENTILATION  . VAGINAL HYSTERECTOMY  1971     Current Outpatient Medications  Medication Sig Dispense Refill  . apixaban (ELIQUIS) 5 MG TABS tablet Take 1 tablet (5 mg total) by mouth 2 (two) times daily. 60 tablet 0  . candesartan (ATACAND) 8 MG tablet Take 1 tablet (8 mg total) by mouth daily. 90 tablet 3  . Cholecalciferol (VITAMIN D3) 2000 units TABS Take 2,000 Units by mouth daily.    . Cyanocobalamin (VITAMIN B-12 PO) Place 1 mL under the tongue 2 (two) times a week.    . desoximetasone (TOPICORT) 0.25 % cream APPLY 1 APPLICATION TOPICALLY 2 TIMES DAILY (Patient taking  differently: Apply 1 application topically 2 (two) times daily as needed (for itching). ) 60 g 5  . dextromethorphan (DELSYM) 30 MG/5ML liquid Take 15 mg by mouth daily as needed for cough.     . dofetilide (TIKOSYN) 125 MCG capsule Take 1 capsule (125 mcg total) by mouth 2 (two) times daily. 180 capsule 2  . furosemide (LASIX) 40 MG tablet Take 1 tablet (40 mg total) by mouth daily. 30 tablet 3  . ipratropium (ATROVENT) 0.06 % nasal spray Place 2 sprays into both nostrils daily as needed for rhinitis.   5  . loratadine (CLARITIN) 10 MG tablet Take 10 mg by mouth daily as needed for allergies.     . Menthol, Topical Analgesic, (BIOFREEZE EX) Apply 1 application topically 3 (three) times daily.    . Multiple Vitamin (MULTIVITAMIN WITH MINERALS) TABS tablet Take 1 tablet by mouth daily.    . naproxen sodium (ALEVE) 220 MG tablet Take 220 mg by mouth at bedtime.    . Omega-3 Fatty Acids (FISH OIL) 1200 MG CAPS Take 1,200 mg by mouth daily.     Marland Kitchen omeprazole (PRILOSEC OTC) 20 MG tablet Take 20 mg by mouth daily as needed (for acid reflux).     . potassium chloride (MICRO-K) 10 MEQ CR capsule Take 1 capsule (10 mEq total) by mouth 2 (two) times daily. 180 capsule 3  . rosuvastatin (CRESTOR) 10 MG tablet TAKE 1 TABLET DAILY (Patient taking differently: Take 10 mg by mouth daily. ) 90 tablet 1  . triamcinolone (NASACORT) 55 MCG/ACT nasal inhaler Place 1 spray into both nostrils daily as needed (for allergies).      No current facility-administered medications for this visit.     Allergies:   Lidocaine; Morphine; Procaine hcl; Sulfonamide derivatives; Vytorin [ezetimibe-simvastatin]; and Tape    Social History:  The patient  reports that she quit smoking about 21 years ago. She quit after 40.00 years of use. She has never used smokeless tobacco. She reports that she does not drink alcohol or use drugs.   Family History:  The patient's family history includes Cancer in her maternal grandmother; Heart  disease in her maternal grandfather.    ROS: All other systems are reviewed and negative. Unless otherwise mentioned in H&P    PHYSICAL EXAM: VS:  BP (!) 130/40   Pulse (!) 57   Ht 5\' 8"  (1.727 m)   Wt 153 lb 6.4 oz (69.6 kg)   SpO2 97%   BMI 23.32 kg/m  , BMI Body mass index is 23.32 kg/m. GEN: Well nourished, well developed, in no acute distress HEENT: normal Neck: no JVD, carotid bruits, or masses Cardiac: RRR; soft systolic  murmurs, rubs, or gallops,no edema  Respiratory:  Clear to auscultation bilaterally, normal work of breathing GI: soft, nontender, nondistended, + BS MS: no  deformity or atrophy Skin: warm and dry, no rash Neuro:  Strength and sensation are intact Psych: euthymic mood, full affect   EKG: Sinus bradycardia rate of 57 bpm. LVH noted.    Recent Labs: 11/06/2017: ALT 19; TSH 1.43 11/16/2017: B Natriuretic Peptide 1,171.1 03/17/2018: Magnesium 1.8 07/30/2018: BUN 24; Creatinine, Ser 1.10; Hemoglobin 13.0; Platelets 224; Potassium 4.0; Sodium 140    Lipid Panel    Component Value Date/Time   CHOL 143 11/06/2017 1113   TRIG 111.0 11/06/2017 1113   HDL 57.60 11/06/2017 1113   CHOLHDL 2 11/06/2017 1113   VLDL 22.2 11/06/2017 1113   LDLCALC 63 11/06/2017 1113      Wt Readings from Last 3 Encounters:  08/24/18 153 lb 6.4 oz (69.6 kg)  08/07/18 154 lb 4.8 oz (70 kg)  07/30/18 154 lb 4.8 oz (70 kg)      Other studies Reviewed: Echocardiogram 29-Nov-2017 Left ventricle: The cavity size was normal. Systolic function was   vigorous. The estimated ejection fraction was in the range of 70%   to 75%. There was dynamic obstruction at restin the outflow   tract, with a peak velocity of 289 cm/sec and a peak gradient of   33 mm Hg. Wall motion was normal; there were no regional wall   motion abnormalities. Features are consistent with a pseudonormal   left ventricular filling pattern, with concomitant abnormal   relaxation and increased filling pressure  (grade 2 diastolic   dysfunction). - Aortic valve: Valve area (VTI): 2.07 cm^2. Valve area (Vmax):   2.08 cm^2. Valve area (Vmean): 2.3 cm^2. - Mitral valve: There was mild to moderate regurgitation directed   centrally. - Left atrium: The atrium was moderately to severely dilated. - Right atrium: The atrium was moderately dilated. - Pulmonary arteries: PA peak pressure: 53 mm Hg (S).  Impressions:  - Although the morphology is not consistent with hypertrophic   cardiomyopathy, there is a mildly increased subvalvular outflow   tract gradient due to hyperdynamic LV contraction and increased   stroke volume.  ASSESSMENT AND PLAN:  1. PAF: She is in sinus bradycardia now, rate is controlled. She denies bleeding or hemoptysis. Continue current regimen   2. Chronic Dyspnea: She is requesting a sleep study as she is awakening a lot at night, doesn't sleep well, and has daytime drowsiness. This will be ordered to evaluate need for CPAP No evidence of volume overload on exam.   3. Hypertension: Currently well controlled. No changes in her medications.  4. Hypercholesterolemia: Continue statin therapy. Labs per PCP.   Current medicines are reviewed at length with the patient today.    Labs/ tests ordered today include: Sleep Study  Phill Myron. West Pugh, ANP, AACC   08/24/2018 2:37 PM    [place] [address] Office [phone] Fax [phone]

## 2018-08-24 ENCOUNTER — Encounter: Payer: Self-pay | Admitting: Adult Health

## 2018-08-24 ENCOUNTER — Ambulatory Visit (INDEPENDENT_AMBULATORY_CARE_PROVIDER_SITE_OTHER): Payer: Medicare Other | Admitting: Adult Health

## 2018-08-24 VITALS — BP 130/40 | HR 57 | Ht 68.0 in | Wt 153.4 lb

## 2018-08-24 DIAGNOSIS — R400 Somnolence: Secondary | ICD-10-CM | POA: Diagnosis not present

## 2018-08-24 DIAGNOSIS — R0602 Shortness of breath: Secondary | ICD-10-CM

## 2018-08-24 DIAGNOSIS — I10 Essential (primary) hypertension: Secondary | ICD-10-CM | POA: Diagnosis not present

## 2018-08-24 DIAGNOSIS — I480 Paroxysmal atrial fibrillation: Secondary | ICD-10-CM

## 2018-08-24 MED ORDER — POTASSIUM CHLORIDE ER 10 MEQ PO CPCR: 10.0000 meq | ORAL_CAPSULE | Freq: Two times a day (BID) | ORAL | 3 refills | Status: DC

## 2018-08-24 MED ORDER — POTASSIUM CHLORIDE ER 10 MEQ PO CPCR: 10.0000 meq | ORAL_CAPSULE | Freq: Two times a day (BID) | ORAL | 0 refills | Status: DC

## 2018-08-24 NOTE — Telephone Encounter (Signed)
[facility], and I specified capsules

## 2018-08-24 NOTE — Telephone Encounter (Signed)
Called and spoke with pt and she is aware of rx sent to the pharmacy

## 2018-08-24 NOTE — Patient Instructions (Signed)
Testing/Procedures: Your physician has recommended that you have a sleep study. This test records several body functions during sleep, including: brain activity, eye movement, oxygen and carbon dioxide blood levels, heart rate and rhythm, breathing rate and rhythm, the flow of air through your mouth and nose, snoring, body muscle movements, and chest and belly movement.  Follow-Up: You will need a follow up appointment in 6 months.  Please call our office 2 months in advance(FEB 2020) to schedule the ([id number]) appointment.  You may see  DR Claiborne Billings or one of the following Advanced Practice Providers on your designated Care Team:   . Almyra Deforest, PA-C . Fabian Sharp, PA-C  Medication Instructions:  NO CHANGES- Your physician recommends that you continue on your current medications as directed. Please refer to the Current Medication list given to you today.  If you need a refill on your cardiac medications before your next appointment, please call your pharmacy.  Labwork: If you have labs (blood work) drawn today and your tests are completely normal, you will receive your results only by: Marland Kitchen MyChart Message (if you have MyChart) OR . A paper copy in the mail If you have any lab test that is abnormal or we need to change your treatment, we will call you to review the results.   At [facility], you and your health needs are our priority.  As part of our continuing mission to provide you with exceptional heart care, we have created designated Provider Care Teams.  These Care Teams include your primary Cardiologist (physician) and Advanced Practice Providers (APPs -  Physician Assistants and Nurse Practitioners) who all work together to provide you with the care you need, when you need it.  Thank you for choosing [facility] at [facility]!!

## 2018-08-25 ENCOUNTER — Telehealth: Payer: Self-pay | Admitting: *Deleted

## 2018-08-25 NOTE — Telephone Encounter (Signed)
Left sleep study appointment details on home VM. (ok per DPR)

## 2018-08-25 NOTE — Telephone Encounter (Signed)
-----   Message from [name], LPN sent at [phone]  2:23 PM EDT ----- Regarding: sleep study I ordered a sleep study today for this Barbara [name] pt

## 2018-09-18 ENCOUNTER — Telehealth: Payer: Self-pay | Admitting: Adult Health

## 2018-09-18 MED ORDER — ROSUVASTATIN CALCIUM 10 MG PO TABS: 10.0000 mg | ORAL_TABLET | Freq: Every day | ORAL | 3 refills | Status: DC

## 2018-09-18 NOTE — Telephone Encounter (Signed)
New Message    *STAT* If patient is at the pharmacy, call can be transferred to refill team.   1. Which medications need to be refilled? (please list name of each medication and dose if known) rosuvastatin (CRESTOR) 10 MG tablet  2. Which pharmacy/location (including street and city if local pharmacy) is medication to be sent to? [facility] [place], [place]  3. Do they need a 30 day or 90 day supply? [place]

## 2018-10-07 ENCOUNTER — Ambulatory Visit (HOSPITAL_BASED_OUTPATIENT_CLINIC_OR_DEPARTMENT_OTHER): Payer: Medicare Other | Attending: Adult Health | Admitting: Cardiovascular Disease

## 2018-10-07 VITALS — Ht 67.0 in | Wt 150.0 lb

## 2018-10-07 DIAGNOSIS — R0602 Shortness of breath: Secondary | ICD-10-CM

## 2018-10-07 DIAGNOSIS — R400 Somnolence: Secondary | ICD-10-CM | POA: Insufficient documentation

## 2018-10-07 DIAGNOSIS — G478 Other sleep disorders: Secondary | ICD-10-CM | POA: Diagnosis not present

## 2018-10-07 DIAGNOSIS — G4733 Obstructive sleep apnea (adult) (pediatric): Secondary | ICD-10-CM | POA: Diagnosis not present

## 2018-10-18 NOTE — Procedures (Signed)
Patient Name: Barbara [name], Barbara [name] Date: 10/07/2018 Gender: Female D.O.B: [birth year] Age (years): 25 Referring Provider: Jory Sims NP Height (inches): 67 Interpreting Physician: Shelva Majestic MD, ABSM Weight (lbs): 150 RPSGT: Jorge Ny BMI: 23 MRN: [id number] Neck Size: 13.00  CLINICAL INFORMATION Sleep Study Type: Split Night CPAP  Indication for sleep study: Congestive Heart Failure, Excessive Daytime Sleepiness, Hypertension  Epworth Sleepiness Score: 9  SLEEP STUDY TECHNIQUE As per the AASM Manual for the Scoring of Sleep and Associated Events v2.3 (April 2016) with a hypopnea requiring 4% desaturations.  The channels recorded and monitored were frontal, central and occipital EEG, electrooculogram (EOG), submentalis EMG (chin), nasal and oral airflow, thoracic and abdominal wall motion, anterior tibialis EMG, snore microphone, electrocardiogram, and pulse oximetry. Continuous positive airway pressure (CPAP) was initiated when the patient met split night criteria and was titrated according to treat sleep-disordered breathing.  MEDICATIONS     apixaban (ELIQUIS) 5 MG TABS tablet         candesartan (ATACAND) 8 MG tablet         Cholecalciferol (VITAMIN D3) 2000 units TABS         Cyanocobalamin (VITAMIN B-12 PO)         desoximetasone (TOPICORT) 0.25 % cream         dextromethorphan (DELSYM) 30 MG/5ML liquid         dofetilide (TIKOSYN) 125 MCG capsule         furosemide (LASIX) 40 MG tablet         ipratropium (ATROVENT) 0.06 % nasal spray         loratadine (CLARITIN) 10 MG tablet         Menthol, Topical Analgesic, (BIOFREEZE EX)         Multiple Vitamin (MULTIVITAMIN WITH MINERALS) TABS tablet         naproxen sodium (ALEVE) 220 MG tablet         Omega-3 Fatty Acids (FISH OIL) 1200 MG CAPS         omeprazole (PRILOSEC OTC) 20 MG tablet         potassium chloride (MICRO-K) 10 MEQ CR capsule         rosuvastatin (CRESTOR) 10 MG tablet        triamcinolone (NASACORT) 55 MCG/ACT nasal inhaler      Medications self-administered by patient taken the night of the study : ELIQUIS, TIKOSYN, ALEVE, POTASSIUM CHLORIDE & SOD CHLOR, CRESTOR  RESPIRATORY PARAMETERS Diagnostic Total AHI (/hr): 36.9 RDI (/hr): 45.1 OA Index (/hr): 18.2 CA Index (/hr): 0.0 REM AHI (/hr): 52.2 NREM AHI (/hr): 35.1 Supine AHI (/hr): 46.8 Non-supine AHI (/hr): 13.1 Min O2 Sat (%): 77.0 Mean O2 (%): 94.6 Time below 88% (min): 3.1   Titration Optimal Pressure (cm): 11 AHI at Optimal Pressure (/hr): 0.0 Min O2 at Optimal Pressure (%): 92.0 Supine % at Optimal (%): 0 Sleep % at Optimal (%): 90   SLEEP ARCHITECTURE The recording time for the entire night was 436.5 minutes.  During a baseline period of 168.6 minutes, the patient slept for 109.0 minutes in REM and nonREM, yielding a sleep efficiency of 64.7%%. Sleep onset after lights out was 5.8 minutes with a REM latency of 136.5 minutes. The patient spent 6.9%% of the night in stage N1 sleep, 63.3%% in stage N2 sleep, 19.3%% in stage N3 and 10.6% in REM.  During the titration period of 259.7 minutes, the patient slept for 206.0 minutes in REM and nonREM, yielding a sleep efficiency  of 79.3%%. Sleep onset after CPAP initiation was 7.5 minutes with a REM latency of 127.5 minutes. The patient spent 7.5%% of the night in stage N1 sleep, 74.8%% in stage N2 sleep, 0.0%% in stage N3 and 17.7% in REM.  CARDIAC DATA The 2 lead EKG demonstrated sinus rhythm. The mean heart rate was 100.0 beats per minute. Other EKG findings include: None.  LEG MOVEMENT DATA The total Periodic Limb Movements of Sleep (PLMS) were 0. The PLMS index was 0.0 .  IMPRESSIONS - Severe obstructive sleep apnea occurred during the diagnostic portion of the study (AHI 36.9/h; RDI 45.1/h); events were worse with supine position (AHI 46.8/h)  and during REM sleep (AHI 52.2/h).  CPAP was initiated at 5 cm and was titrated to optimal PAP pressure of 11  cm of water. - No significant central sleep apnea occurred during the diagnostic portion of the study (CAI = 0.0/hour). - Severe oxygen desaturation during the diagnostic portion of the study to a nadir of 77%. - The patient snored with loud snoring volume during the diagnostic portion of the study. - No cardiac abnormalities were noted during this study. - Clinically significant periodic limb movements did not occur during sleep.  DIAGNOSIS - Obstructive Sleep Apnea (327.23 [G47.33 ICD-10])  RECOMMENDATIONS - Trial of CPAP therapy with EPR at 11 cm H2O with heated humidification. A Medium size Resmed Full Face Mask AirFit 20 for Her mask was used for the titration.  - Effort should be made to optimize nasal and oropharyngeal patency. - Avoid alcohol, sedatives and other CNS depressants that may worsen sleep apnea and disrupt normal sleep architecture. - Sleep hygiene should be reviewed to assess factors that may improve sleep quality. - Weight management and regular exercise should be initiated or continued. - Recommend a download in 30 days and sleep clinic evaluation after 4 weeks of therapy.  [Electronically signed] 10/18/2018 04:25 PM  Shelva Majestic MD, [facility] [place], American Board of Sleep Medicine   NPI: [id number] [place] PH: [phone]   FX: [phone] [place]

## 2018-10-19 ENCOUNTER — Telehealth: Payer: Self-pay | Admitting: *Deleted

## 2018-10-19 NOTE — Telephone Encounter (Signed)
Patient informed of sleep study results and recommendations. CPAP orders sent to [facility].

## 2018-10-19 NOTE — Progress Notes (Signed)
Please make sure that Mariann Laster knows about this.

## 2018-10-19 NOTE — Telephone Encounter (Signed)
-----   Message from Troy Sine, MD sent at 10/18/2018  4:33 PM EST ----- Mariann Laster, please notify pt and set up with DME for CPAP

## 2018-10-30 ENCOUNTER — Telehealth: Payer: Self-pay | Admitting: Cardiovascular Disease

## 2018-10-30 NOTE — Telephone Encounter (Signed)
Follow Up:      Calling to find out about her C-Pap.

## 2018-10-30 NOTE — Telephone Encounter (Signed)
Spoke to patient, advised [facility]ed to [facility] on 12/16.     Advised to call [facility] to follow up-# provided.  Patient aware.

## 2018-11-19 ENCOUNTER — Encounter: Payer: Self-pay | Admitting: Family Medicine

## 2018-11-19 ENCOUNTER — Ambulatory Visit (INDEPENDENT_AMBULATORY_CARE_PROVIDER_SITE_OTHER): Payer: Medicare Other | Admitting: Family Medicine

## 2018-11-19 VITALS — BP 142/68 | HR 47 | Temp 98.9°F | Ht 67.0 in | Wt 151.0 lb

## 2018-11-19 DIAGNOSIS — M159 Polyosteoarthritis, unspecified: Secondary | ICD-10-CM

## 2018-11-19 DIAGNOSIS — I509 Heart failure, unspecified: Secondary | ICD-10-CM

## 2018-11-19 DIAGNOSIS — I10 Essential (primary) hypertension: Secondary | ICD-10-CM | POA: Diagnosis not present

## 2018-11-19 DIAGNOSIS — K219 Gastro-esophageal reflux disease without esophagitis: Secondary | ICD-10-CM | POA: Diagnosis not present

## 2018-11-19 DIAGNOSIS — E785 Hyperlipidemia, unspecified: Secondary | ICD-10-CM

## 2018-11-19 DIAGNOSIS — M8949 Other hypertrophic osteoarthropathy, multiple sites: Secondary | ICD-10-CM

## 2018-11-19 DIAGNOSIS — M150 Primary generalized (osteo)arthritis: Secondary | ICD-10-CM | POA: Diagnosis not present

## 2018-11-19 LAB — BASIC METABOLIC PANEL
BUN: 25 mg/dL — ABNORMAL HIGH (ref 6–23)
CO2: 31 mEq/L (ref 19–32)
Calcium: 10.5 mg/dL (ref 8.4–10.5)
Chloride: 104 mEq/L (ref 96–112)
Creatinine, Ser: 1 mg/dL (ref 0.40–1.20)
GFR: 64.6 mL/min (ref >60.00)
Glucose, Bld: 80 mg/dL (ref 70–99)
Potassium: 4.4 mEq/L (ref 3.5–5.1)
Sodium: 142 mEq/L (ref 135–145)

## 2018-11-19 LAB — CBC WITH DIFFERENTIAL/PLATELET
Basophils Absolute: 0 10*3/uL (ref 0.0–0.1)
Basophils Relative: 0.6 % (ref 0.0–3.0)
Eosinophils Absolute: 0.3 10*3/uL (ref 0.0–0.7)
Eosinophils Relative: 5.3 % — ABNORMAL HIGH (ref 0.0–5.0)
HCT: 38.2 % (ref 36.0–46.0)
Hemoglobin: 12.7 g/dL (ref 12.0–15.0)
Lymphocytes Relative: 27 % (ref 12.0–46.0)
Lymphs Abs: 1.3 10*3/uL (ref 0.7–4.0)
MCHC: 33.3 g/dL (ref 30.0–36.0)
MCV: 86.9 fl (ref 78.0–100.0)
Monocytes Absolute: 0.4 10*3/uL (ref 0.1–1.0)
Monocytes Relative: 8.5 % (ref 3.0–12.0)
Neutro Abs: 2.8 10*3/uL (ref 1.4–7.7)
Neutrophils Relative %: 58.6 % (ref 43.0–77.0)
Platelets: 172 10*3/uL (ref 150.0–400.0)
RBC: 4.4 Mil/uL (ref 3.87–5.11)
RDW: 13.3 % (ref 11.5–15.5)
WBC: 4.8 10*3/uL (ref 4.0–10.5)

## 2018-11-19 LAB — LIPID PANEL
Cholesterol: 156 mg/dL (ref 0–200)
HDL: 57.9 mg/dL (ref >39.00)
LDL Cholesterol: 74 mg/dL (ref 0–99)
NonHDL: 97.85
Total CHOL/HDL Ratio: 3
Triglycerides: 117 mg/dL (ref 0.0–149.0)
VLDL: 23.4 mg/dL (ref 0.0–40.0)

## 2018-11-19 LAB — HEPATIC FUNCTION PANEL
ALT: 18 U/L (ref 0–35)
AST: 23 U/L (ref 0–37)
Albumin: 4.2 g/dL (ref 3.5–5.2)
Alkaline Phosphatase: 71 U/L (ref 39–117)
Bilirubin, Direct: 0.1 mg/dL (ref 0.0–0.3)
Total Bilirubin: 0.5 mg/dL (ref 0.2–1.2)
Total Protein: 7 g/dL (ref 6.0–8.3)

## 2018-11-19 LAB — TSH: TSH: 1.85 u[IU]/mL (ref 0.35–4.50)

## 2018-11-19 NOTE — Progress Notes (Signed)
   Subjective:    Patient ID: Barbara [name], female    DOB: 08/10/1941, 78 y.o.   MRN: [id number]  HPI Here to follow up on issues. She feels well today. She recently started using a CPAP machine with a nasal pillow for sleep apnea, but it is a little too early to tell if this is helping or not. She had vocal cord surgery last year per Dr. Redmond Baseman and her voice is now about 90% back to normal. Her BP is stable. Her GERD is stable.    Review of Systems  Constitutional: Negative.   HENT: Negative.   Eyes: Negative.   Respiratory: Negative.   Cardiovascular: Negative.   Gastrointestinal: Negative.   Genitourinary: Negative for decreased urine volume, difficulty urinating, dyspareunia, dysuria, enuresis, flank pain, frequency, hematuria, pelvic pain and urgency.  Musculoskeletal: Negative.   Skin: Negative.   Neurological: Negative.   Psychiatric/Behavioral: Negative.        Objective:   Physical Exam Constitutional:      General: She is not in acute distress.    Appearance: She is well-developed.  HENT:     Head: Normocephalic and atraumatic.     Right Ear: External ear normal.     Left Ear: External ear normal.     Nose: Nose normal.     Mouth/Throat:     Pharynx: No oropharyngeal exudate.  Eyes:     General: No scleral icterus.    Conjunctiva/sclera: Conjunctivae normal.     Pupils: Pupils are equal, round, and reactive to light.  Neck:     Musculoskeletal: Normal range of motion and neck supple.     Thyroid: No thyromegaly.     Vascular: No JVD.  Cardiovascular:     Rate and Rhythm: Normal rate and regular rhythm.     Heart sounds: Normal heart sounds. No murmur. No friction rub. No gallop.   Pulmonary:     Effort: Pulmonary effort is normal. No respiratory distress.     Breath sounds: Normal breath sounds. No wheezing or rales.  Chest:     Chest wall: No tenderness.  Abdominal:     General: Bowel sounds are normal. There is no distension.     Palpations:  Abdomen is soft. There is no mass.     Tenderness: There is no abdominal tenderness. There is no guarding or rebound.  Musculoskeletal: Normal range of motion.        General: No tenderness.  Lymphadenopathy:     Cervical: No cervical adenopathy.  Skin:    General: Skin is warm and dry.     Findings: No erythema or rash.  Neurological:     Mental Status: She is alert and oriented to person, place, and time.     Cranial Nerves: No cranial nerve deficit.     Motor: No abnormal muscle tone.     Coordination: Coordination normal.     Deep Tendon Reflexes: Reflexes are normal and symmetric. Reflexes normal.  Psychiatric:        Behavior: Behavior normal.        Thought Content: Thought content normal.        Judgment: Judgment normal.           Assessment & Plan:  Her HTN and CHF are stable. Her GERD and arthritis are stable. Get fasting labs today to check lipids, etc. Hopefully she will respond to CPAP therapy.  Alysia Penna, MD

## 2018-11-27 ENCOUNTER — Encounter: Payer: Self-pay | Admitting: Family Medicine

## 2018-11-27 ENCOUNTER — Ambulatory Visit: Payer: Self-pay | Admitting: *Deleted

## 2018-11-27 ENCOUNTER — Ambulatory Visit (INDEPENDENT_AMBULATORY_CARE_PROVIDER_SITE_OTHER): Payer: Medicare Other | Admitting: Family Medicine

## 2018-11-27 VITALS — BP 150/72 | HR 48 | Temp 98.7°F | Wt 157.1 lb

## 2018-11-27 DIAGNOSIS — J069 Acute upper respiratory infection, unspecified: Secondary | ICD-10-CM | POA: Diagnosis not present

## 2018-11-27 NOTE — Telephone Encounter (Signed)
Noted  

## 2018-11-27 NOTE — Progress Notes (Signed)
   Subjective:    Patient ID: [name], female    DOB: 06/20/1941, 78 y.o.   MRN: [id number]  HPI Here for 5 days of stuffy head, itchy eyes, PND, and ST. No fever or cough. Using Flonase and Claritin.   Review of Systems  Constitutional: Negative.   HENT: Positive for congestion, postnasal drip, rhinorrhea, sneezing and sore throat.   Eyes: Positive for itching.  Respiratory: Negative.        Objective:   Physical Exam Constitutional:      Appearance: Normal appearance.  HENT:     Right Ear: Tympanic membrane and ear canal normal.     Left Ear: Tympanic membrane and ear canal normal.     Nose: Nose normal.     Mouth/Throat:     Pharynx: Oropharynx is clear.  Eyes:     Conjunctiva/sclera: Conjunctivae normal.  Pulmonary:     Effort: Pulmonary effort is normal.     Breath sounds: Normal breath sounds.  Lymphadenopathy:     Cervical: No cervical adenopathy.  Neurological:     Mental Status: She is alert.           Assessment & Plan:  Viral URI, add Mucinex prn.  Alysia Penna, MD

## 2018-11-27 NOTE — Telephone Encounter (Signed)
Patient with nasal congestion and runny nose for one week. Clear drainage at this time.  Rt eye watery and draining. No SOB/Fever. Possible swollen lymph nodes under the jaw on right side of neck.Has been using Nasacort Spray, claritin and delsym daily. Appointment made for 1:30p 15 minute slot okay from [place].   Reason for Disposition . [1] Sinus congestion (pressure, fullness) AND [2] present > 10 days  Answer Assessment - Initial Assessment Questions 1. ONSET: "When did the nasal discharge start?"      One week ago  2. AMOUNT: "How much discharge is there?"      Runny nose with clear discharge 3. COUGH: "Do you have a cough?" If yes, ask: "Describe the color of your sputum" (clear, white, yellow, green)    No cough 4. RESPIRATORY DISTRESS: "Describe your breathing."      no 5. FEVER: "Do you have a fever?" If so, ask: "What is your temperature, how was it measured, and when did it start?"     no 6. SEVERITY: "Overall, how bad are you feeling right now?" (e.g., doesn't interfere with normal activities, staying home from school/work, staying in bed)      mild 7. OTHER SYMPTOMS: "Do you have any other symptoms?" (e.g., sore throat, earache, wheezing, vomiting)     none 8. PREGNANCY: "Is there any chance you are pregnant?" "When was your last menstrual period?"     no  Protocols used: COMMON COLD-A-AH

## 2018-11-30 ENCOUNTER — Telehealth: Payer: Self-pay | Admitting: Cardiovascular Disease

## 2018-11-30 NOTE — Telephone Encounter (Signed)
Please advise. Thank you

## 2018-11-30 NOTE — Telephone Encounter (Signed)
Returned a call to patient and advised her to call [facility]. Contact information provided to her.

## 2018-11-30 NOTE — Telephone Encounter (Signed)
New message   Pt said she is having problems with her CPAP. She states air is coming from the nozzle.  Please call

## 2018-12-17 ENCOUNTER — Other Ambulatory Visit: Payer: Self-pay | Admitting: Cardiovascular Disease

## 2018-12-29 ENCOUNTER — Other Ambulatory Visit: Payer: Self-pay | Admitting: Cardiovascular Disease

## 2019-01-11 ENCOUNTER — Telehealth: Payer: Self-pay

## 2019-01-11 NOTE — Telephone Encounter (Signed)
Received fax that patient Eliquis PA was approved from 12/09/2018- 01/08/2020

## 2019-01-12 ENCOUNTER — Encounter: Payer: Self-pay | Admitting: Cardiovascular Disease

## 2019-01-12 NOTE — Telephone Encounter (Signed)
Pt got a letter saying they couldn't fill her rx. The doctor needs to contact  [employer] to solve the problem Prior Auth [phone] Pt will be out of medication before her next appt

## 2019-02-02 ENCOUNTER — Telehealth: Payer: Self-pay | Admitting: Cardiovascular Disease

## 2019-02-02 NOTE — Telephone Encounter (Signed)
   Primary Cardiologist:  Shelva Majestic, MD   Patient contacted.  History reviewed.  No symptoms to suggest any unstable cardiac conditions.  Based on discussion, with current pandemic situation, we will be postponing this appointment for [name] with a plan for f/u in 6 wks or sooner if feasible/necessary.  If symptoms change, she has been instructed to contact our office.   Pt stated she feels like the air pressure is too much. She has a mask that sits "up under her nose" but air comes out and starts blowing in her eyes and she takes it off around 3-4 AM. I told pt I would send message to Dr. Claiborne Billings to let him know. Pt stated she needs eliquis sent in for 180 tab (90 day supply). She stated the Rx she got was only 60 tab and she had to pay the same price as 90 day. Told pt I will send that in for her. Pt verbalized thanks for call.  Routing to C19 CANCEL pool for tracking (P CV DIV CV19 CANCEL - reason for visit "other.") and assigning priority (1 = 4-6 wks, 2 = 6-12 wks, 3 = >12 wks).   Therisa Doyne  02/02/2019 5:12 PM         .

## 2019-02-03 NOTE — Telephone Encounter (Signed)
Her download shows excellent compliance.  She is averaging 6 hours and 45 minutes per night.  AHI is excellent at 1.8.  There does not appear to be a significant leak.  However if she believes the pressure is too high perhaps try decreasing to 10 cm water pressure.

## 2019-02-03 NOTE — Telephone Encounter (Signed)
Called patient, LVM with message from MD.  I advised that settings were changed via online, and to try this and to call back if issues continued.  Gave call back number.

## 2019-02-03 NOTE — Telephone Encounter (Signed)
Pressure changed by Caprice Beaver in [place].

## 2019-02-03 NOTE — Telephone Encounter (Signed)
Downloaded [facility], will have Dr.Kelly review and get back to me with any changes.

## 2019-02-03 NOTE — Telephone Encounter (Signed)
See below

## 2019-02-05 ENCOUNTER — Ambulatory Visit: Payer: Medicare Other | Admitting: Cardiovascular Disease

## 2019-02-08 ENCOUNTER — Encounter: Payer: Self-pay | Admitting: Family Medicine

## 2019-02-16 ENCOUNTER — Ambulatory Visit: Payer: Medicare Other | Admitting: Cardiovascular Disease

## 2019-02-25 ENCOUNTER — Other Ambulatory Visit: Payer: Self-pay

## 2019-02-25 ENCOUNTER — Telehealth: Payer: Self-pay | Admitting: Cardiovascular Disease

## 2019-02-25 ENCOUNTER — Encounter (HOSPITAL_COMMUNITY): Payer: Self-pay | Admitting: Emergency Medicine

## 2019-02-25 ENCOUNTER — Telehealth: Payer: Self-pay | Admitting: Physician Assistant

## 2019-02-25 ENCOUNTER — Emergency Department (HOSPITAL_COMMUNITY): Payer: Medicare Other

## 2019-02-25 ENCOUNTER — Inpatient Hospital Stay (HOSPITAL_COMMUNITY)
Admission: EM | Admit: 2019-02-25 | Discharge: 2019-02-28 | DRG: 282 | Disposition: A | Discharge status: Home / Self Care | Payer: Medicare Other | Attending: Cardiology | Admitting: Cardiology

## 2019-02-25 DIAGNOSIS — Z885 Allergy status to narcotic agent status: Secondary | ICD-10-CM

## 2019-02-25 DIAGNOSIS — Z8249 Family history of ischemic heart disease and other diseases of the circulatory system: Secondary | ICD-10-CM

## 2019-02-25 DIAGNOSIS — Z87891 Personal history of nicotine dependence: Secondary | ICD-10-CM | POA: Diagnosis not present

## 2019-02-25 DIAGNOSIS — R079 Chest pain, unspecified: Secondary | ICD-10-CM | POA: Diagnosis present

## 2019-02-25 DIAGNOSIS — I4891 Unspecified atrial fibrillation: Secondary | ICD-10-CM | POA: Diagnosis not present

## 2019-02-25 DIAGNOSIS — I110 Hypertensive heart disease with heart failure: Secondary | ICD-10-CM | POA: Diagnosis present

## 2019-02-25 DIAGNOSIS — Z91048 Other nonmedicinal substance allergy status: Secondary | ICD-10-CM

## 2019-02-25 DIAGNOSIS — I2510 Atherosclerotic heart disease of native coronary artery without angina pectoris: Secondary | ICD-10-CM | POA: Diagnosis present

## 2019-02-25 DIAGNOSIS — Z955 Presence of coronary angioplasty implant and graft: Secondary | ICD-10-CM | POA: Diagnosis not present

## 2019-02-25 DIAGNOSIS — G4733 Obstructive sleep apnea (adult) (pediatric): Secondary | ICD-10-CM | POA: Diagnosis present

## 2019-02-25 DIAGNOSIS — I214 Non-ST elevation (NSTEMI) myocardial infarction: Principal | ICD-10-CM | POA: Diagnosis present

## 2019-02-25 DIAGNOSIS — R001 Bradycardia, unspecified: Secondary | ICD-10-CM | POA: Diagnosis present

## 2019-02-25 DIAGNOSIS — Z809 Family history of malignant neoplasm, unspecified: Secondary | ICD-10-CM | POA: Diagnosis not present

## 2019-02-25 DIAGNOSIS — K219 Gastro-esophageal reflux disease without esophagitis: Secondary | ICD-10-CM | POA: Diagnosis present

## 2019-02-25 DIAGNOSIS — I480 Paroxysmal atrial fibrillation: Secondary | ICD-10-CM

## 2019-02-25 DIAGNOSIS — Z882 Allergy status to sulfonamides status: Secondary | ICD-10-CM

## 2019-02-25 DIAGNOSIS — I219 Acute myocardial infarction, unspecified: Secondary | ICD-10-CM | POA: Diagnosis present

## 2019-02-25 DIAGNOSIS — I509 Heart failure, unspecified: Secondary | ICD-10-CM | POA: Diagnosis present

## 2019-02-25 DIAGNOSIS — I10 Essential (primary) hypertension: Secondary | ICD-10-CM | POA: Diagnosis not present

## 2019-02-25 DIAGNOSIS — E785 Hyperlipidemia, unspecified: Secondary | ICD-10-CM | POA: Diagnosis present

## 2019-02-25 DIAGNOSIS — Z7901 Long term (current) use of anticoagulants: Secondary | ICD-10-CM | POA: Diagnosis not present

## 2019-02-25 DIAGNOSIS — R0789 Other chest pain: Secondary | ICD-10-CM | POA: Diagnosis not present

## 2019-02-25 HISTORY — DX: Non-ST elevation (NSTEMI) myocardial infarction: I21.4

## 2019-02-25 LAB — CBC WITH DIFFERENTIAL/PLATELET
Abs Immature Granulocytes: 0.01 10*3/uL (ref 0.00–0.07)
Basophils Absolute: 0 10*3/uL (ref 0.0–0.1)
Basophils Relative: 0 %
Eosinophils Absolute: 0.2 10*3/uL (ref 0.0–0.5)
Eosinophils Relative: 3 %
HCT: 43 % (ref 36.0–46.0)
Hemoglobin: 14.1 g/dL (ref 12.0–15.0)
Immature Granulocytes: 0 %
Lymphocytes Relative: 20 %
Lymphs Abs: 1.3 10*3/uL (ref 0.7–4.0)
MCH: 29.2 pg (ref 26.0–34.0)
MCHC: 32.8 g/dL (ref 30.0–36.0)
MCV: 89 fL (ref 80.0–100.0)
Monocytes Absolute: 0.6 10*3/uL (ref 0.1–1.0)
Monocytes Relative: 9 %
Neutro Abs: 4.5 10*3/uL (ref 1.7–7.7)
Neutrophils Relative %: 68 %
Platelets: 197 10*3/uL (ref 150–400)
RBC: 4.83 MIL/uL (ref 3.87–5.11)
RDW: 12.8 % (ref 11.5–15.5)
WBC: 6.6 10*3/uL (ref 4.0–10.5)
nRBC: 0 % (ref 0.0–0.2)

## 2019-02-25 LAB — COMPREHENSIVE METABOLIC PANEL
ALT: 29 U/L (ref 0–44)
AST: 52 U/L — ABNORMAL HIGH (ref 15–41)
Albumin: 4.4 g/dL (ref 3.5–5.0)
Alkaline Phosphatase: 78 U/L (ref 38–126)
Anion gap: 10 (ref 5–15)
BUN: 27 mg/dL — ABNORMAL HIGH (ref 8–23)
CO2: 26 mmol/L (ref 22–32)
Calcium: 10.4 mg/dL — ABNORMAL HIGH (ref 8.9–10.3)
Chloride: 103 mmol/L (ref 98–111)
Creatinine, Ser: 1.1 mg/dL — ABNORMAL HIGH (ref 0.44–1.00)
GFR calc Af Amer: 55 mL/min — ABNORMAL LOW (ref >60)
GFR calc non Af Amer: 48 mL/min — ABNORMAL LOW (ref >60)
Glucose, Bld: 101 mg/dL — ABNORMAL HIGH (ref 70–99)
Potassium: 4.9 mmol/L (ref 3.5–5.1)
Sodium: 139 mmol/L (ref 135–145)
Total Bilirubin: 1 mg/dL (ref 0.3–1.2)
Total Protein: 7.5 g/dL (ref 6.5–8.1)

## 2019-02-25 LAB — POCT I-STAT EG7
Acid-Base Excess: 5 mmol/L — ABNORMAL HIGH (ref 0.0–2.0)
Bicarbonate: 30.3 mmol/L — ABNORMAL HIGH (ref 20.0–28.0)
Calcium, Ion: 1.25 mmol/L (ref 1.15–1.40)
HCT: 40 % (ref 36.0–46.0)
Hemoglobin: 13.6 g/dL (ref 12.0–15.0)
O2 Saturation: 73 %
Potassium: 4.6 mmol/L (ref 3.5–5.1)
Sodium: 140 mmol/L (ref 135–145)
TCO2: 32 mmol/L (ref 22–32)
pCO2, Ven: 44.8 mmHg (ref 44.0–60.0)
pH, Ven: 7.438 — ABNORMAL HIGH (ref 7.250–7.430)
pO2, Ven: 38 mmHg (ref 32.0–45.0)

## 2019-02-25 LAB — TROPONIN I
Troponin I: 1.48 ng/mL (ref <0.03)
Troponin I: 1.76 ng/mL (ref <0.03)
Troponin I: 1.2 ng/mL (ref <0.03)

## 2019-02-25 LAB — HEMOGLOBIN A1C
Hgb A1c MFr Bld: 5.6 % (ref 4.8–5.6)
Mean Plasma Glucose: 114.02 mg/dL

## 2019-02-25 LAB — I-STAT CREATININE, ED: Creatinine, Ser: 1 mg/dL (ref 0.44–1.00)

## 2019-02-25 LAB — BRAIN NATRIURETIC PEPTIDE: B Natriuretic Peptide: 703.5 pg/mL — ABNORMAL HIGH (ref 0.0–100.0)

## 2019-02-25 MED ORDER — ASPIRIN EC 81 MG PO TBEC: 81.0000 mg | DELAYED_RELEASE_TABLET | Freq: Every day | ORAL | Status: DC

## 2019-02-25 MED ORDER — ONDANSETRON HCL 4 MG/2ML IJ SOLN: 4.0000 mg | Freq: Four times a day (QID) | INTRAMUSCULAR | Status: DC | PRN

## 2019-02-25 MED ORDER — IRBESARTAN 75 MG PO TABS
75.0000 mg | ORAL_TABLET | Freq: Every day | ORAL | Status: DC
  Administered 2019-02-26 – 2019-02-28 (×3): 75 mg via ORAL
  Filled 2019-02-25 (×3): qty 1

## 2019-02-25 MED ORDER — SODIUM CHLORIDE 0.9 % WEIGHT BASED INFUSION
1.0000 mL/kg/h | INTRAVENOUS | Status: DC
  Administered 2019-02-26: 06:00:00 1 mL/kg/h via INTRAVENOUS

## 2019-02-25 MED ORDER — ASPIRIN 81 MG PO CHEW
324.0000 mg | CHEWABLE_TABLET | Freq: Once | ORAL | Status: AC
  Administered 2019-02-25: 12:00:00 324 mg via ORAL
  Filled 2019-02-25: qty 4

## 2019-02-25 MED ORDER — SODIUM CHLORIDE 0.9 % IV SOLN: 250.0000 mL | INTRAVENOUS | Status: DC | PRN

## 2019-02-25 MED ORDER — ASPIRIN EC 81 MG PO TBEC
81.0000 mg | DELAYED_RELEASE_TABLET | Freq: Every day | ORAL | Status: DC
  Administered 2019-02-27 – 2019-02-28 (×2): 81 mg via ORAL
  Filled 2019-02-25 (×2): qty 1

## 2019-02-25 MED ORDER — SODIUM CHLORIDE 0.9% FLUSH: 3.0000 mL | INTRAVENOUS | Status: DC | PRN

## 2019-02-25 MED ORDER — ROSUVASTATIN CALCIUM 5 MG PO TABS: 10.0000 mg | ORAL_TABLET | Freq: Every day | ORAL | Status: DC

## 2019-02-25 MED ORDER — NITROGLYCERIN 2 % TD OINT
0.5000 [in_us] | TOPICAL_OINTMENT | Freq: Four times a day (QID) | TRANSDERMAL | Status: DC
  Administered 2019-02-25 – 2019-02-28 (×11): 0.5 [in_us] via TOPICAL
  Filled 2019-02-25: qty 30

## 2019-02-25 MED ORDER — ROSUVASTATIN CALCIUM 20 MG PO TABS
40.0000 mg | ORAL_TABLET | Freq: Every day | ORAL | Status: DC
  Administered 2019-02-25 – 2019-02-27 (×3): 40 mg via ORAL
  Filled 2019-02-25 (×3): qty 2

## 2019-02-25 MED ORDER — SODIUM CHLORIDE 0.9 % WEIGHT BASED INFUSION
3.0000 mL/kg/h | INTRAVENOUS | Status: AC
  Administered 2019-02-26: 04:00:00 3 mL/kg/h via INTRAVENOUS

## 2019-02-25 MED ORDER — SODIUM CHLORIDE 0.9% FLUSH
3.0000 mL | Freq: Two times a day (BID) | INTRAVENOUS | Status: DC
  Administered 2019-02-25: 3 mL via INTRAVENOUS

## 2019-02-25 MED ORDER — ACETAMINOPHEN 325 MG PO TABS
650.0000 mg | ORAL_TABLET | ORAL | Status: DC | PRN
  Administered 2019-02-27 – 2019-02-28 (×3): 650 mg via ORAL
  Filled 2019-02-25 (×4): qty 2

## 2019-02-25 MED ORDER — NITROGLYCERIN 0.4 MG SL SUBL: 0.4000 mg | SUBLINGUAL_TABLET | SUBLINGUAL | Status: DC | PRN

## 2019-02-25 MED ORDER — ASPIRIN 81 MG PO CHEW
81.0000 mg | CHEWABLE_TABLET | ORAL | Status: AC
  Administered 2019-02-26: 81 mg via ORAL
  Filled 2019-02-25: qty 1

## 2019-02-25 MED ORDER — DOFETILIDE 125 MCG PO CAPS
125.0000 ug | ORAL_CAPSULE | Freq: Two times a day (BID) | ORAL | Status: DC
  Administered 2019-02-25 – 2019-02-28 (×6): 125 ug via ORAL
  Filled 2019-02-25 (×6): qty 1

## 2019-02-25 MED ORDER — HEPARIN (PORCINE) 25000 UT/250ML-% IV SOLN
850.0000 [IU]/h | INTRAVENOUS | Status: DC
  Administered 2019-02-25: 900 [IU]/h via INTRAVENOUS
  Filled 2019-02-25: qty 250

## 2019-02-25 NOTE — Progress Notes (Signed)
ANTICOAGULATION CONSULT NOTE - Initial Consult  Pharmacy Consult for heparin Indication: chest pain/ACS  Allergies  Allergen Reactions  . Lidocaine Anaphylaxis  . Morphine Other (See Comments)    "Body shuts down"  . Procaine Hcl Rash and Other (See Comments)    "Anything with 'caine' in it " [ANAPHYLAXIS TO LIDOCAINE]  . Sulfonamide Derivatives Hives  . Vytorin [Ezetimibe-Simvastatin] Other (See Comments)    UNSPECIFIED REACTION   . Tape Itching    Patient Measurements: Height: 5\' 8"  (172.7 cm) Weight: 157 lb (71.2 kg) IBW/kg (Calculated) : 63.9 Heparin Dosing Weight: 71.2kg  Vital Signs: Temp: 98.6 F (37 C) (04/23 1106) Temp Source: Oral (04/23 1106) BP: 161/50 (04/23 1300) Pulse Rate: 49 (04/23 1300)  Labs: Recent Labs    02/25/19 1122 02/25/19 1129 02/25/19 1130  HGB 14.1  --  13.6  HCT 43.0  --  40.0  PLT 197  --   --   CREATININE 1.10* 1.00  --   TROPONINI 1.48*  --   --     Estimated Creatinine Clearance: 46 mL/min (by C-G formula based on SCr of 1 mg/dL).   Medical History: Past Medical History:  Diagnosis Date  . A-fib ([place])   . Allergy   . CAD (coronary artery disease)    sees Dr. Shelva Majestic  cardiac stents - 2000  . CHF (congestive heart failure) ([place])   . Colon polyps   . Dyspnea    02/12/18 " when my heart gets out of rhythm, it has not been out of rhythym- since I have been on Tikosyn (11/2017)  . Dysrhythmia    afib fib  . GERD (gastroesophageal reflux disease)    takes OTC- Omeprazole- prn  . Heart murmur   . History of stress test    show normal perfusion without scar or ischemia, post EF 68%  . Hx of echocardiogram    show an EF 55%-60% range with grade 1 diastolic dysfunction, she had mitral anular calcification with mild MR, moderate LA dilation and mild pulmonary hypertension with a PA estimated pressure of 30mm  . Hyperlipidemia   . Hypertension   . Osteoarthritis   . Pneumonia    hx of 2015   . PONV (postoperative nausea  and vomiting)     Medications:  Infusions:  . heparin      Assessment: 21 yof presented to the ED with CP. Troponin elevated and now starting IV heparin. She is on apixaban PTA with her last dose this morning at 8am. Baseline CBC is WNL and no bleeding noted.   Goal of Therapy:  Heparin level 0.3-0.7 units/ml aPTT 66-103 seconds Monitor platelets by anticoagulation protocol: Yes   Plan:  Heparin gtt 900 units/hr starting tonight at 8pm Check an 8 hr heparin level and aPTT Daily heparin level, aPTT and CBC  Leng Montesdeoca, Rande Lawman 02/25/2019,1:11 PM

## 2019-02-25 NOTE — ED Provider Notes (Signed)
[name] Provider Note   CSN: [id number] Arrival date & time: 02/25/19  1058    History   Chief Complaint Chief Complaint  Patient presents with  . Chest Pain    HPI Barbara [name] is a 78 y.o. female.     The history is provided by the patient and medical records. No language interpreter was used.  Chest Pain   [name] is a 78 y.o. female who presents to the Emergency Department complaining of chest pain. She presents to the emergency department complaining of chest pain that began late last night. She had severe substernal chest pain that lasted most of the night. She was unable to sleep due to the pain. Overall the pain has resolved but she does have persistent shortness of breath and feels like her heart is fluttering and irregular. She has a history of similar symptoms in the past related to congestive heart failure. She denies any fevers, cough, abdominal pain, leg swelling or pain. She does take Eliquis for history of atrial fibrillation. Past Medical History:  Diagnosis Date  . A-fib ([place])   . Allergy   . CAD (coronary artery disease)    sees Dr. Shelva Majestic  cardiac stents - 2000  . CHF (congestive heart failure) ([place])   . Colon polyps   . Dyspnea    02/12/18 " when my heart gets out of rhythm, it has not been out of rhythym- since I have been on Tikosyn (11/2017)  . Dysrhythmia    afib fib  . GERD (gastroesophageal reflux disease)    takes OTC- Omeprazole- prn  . Heart murmur   . History of stress test    show normal perfusion without scar or ischemia, post EF 68%  . Hx of echocardiogram    show an EF 55%-60% range with grade 1 diastolic dysfunction, she had mitral anular calcification with mild MR, moderate LA dilation and mild pulmonary hypertension with a PA estimated pressure of 30mm  . Hyperlipidemia   . Hypertension   . Osteoarthritis   . Pneumonia    hx of 2015     Patient Active Problem  List   Diagnosis Date Noted  . NSTEMI (non-ST elevated myocardial infarction) ([place]) 02/25/2019  . CHF (congestive heart failure) ([place]) 11/16/2017  . Gastroesophageal reflux disease 11/06/2017  . Bradycardia 12/27/2015  . Exertional dyspnea 12/23/2014  . Preoperative clearance 12/23/2014  . Hyperlipidemia LDL goal <70 09/13/2014  . Sinus bradycardia 09/13/2014  . LUMBAGO 05/03/2010  . VERTIGO 01/21/2009  . TEMPOROMANDIBULAR JOINT PAIN 08/31/2008  . Osteoarthritis 05/31/2008  . 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 HYPERLIPIDEMIA 04/06/2007  . Hypertensive disorder 04/06/2007  . Coronary atherosclerosis 04/06/2007  . ALLERGIC RHINITIS 04/06/2007  . Personal history of colonic polyps 04/06/2007    Past Surgical History:  Procedure Laterality Date  . CARDIAC CATHETERIZATION     11/2017  . cardiac stents  2000  . COLONOSCOPY  01-05-14   per Dr. Teena Irani, clear, no repeats needed   . CORONARY STENT PLACEMENT  2000   in [facility]  . DIRECT LARYNGOSCOPY WITH RADIAESSE INJECTION N/A 02/13/2018   Procedure: DIRECT LARYNGOSCOPY WITH RADIAESSE INJECTION;  Surgeon: Melida Quitter, MD;  Location: [facility];  Service: ENT;  Laterality: N/A;  . KNEE ARTHROSCOPY Left 01/06/2015   Procedure: LEFT KNEE ARTHROSCOPY, abrasion chondroplasty of the medial femerol condryl,medial and lateral menisectomy, microfracture , synovectomy of the suprpatellar pouch;  Surgeon: Latanya Maudlin, MD;  Location: [facility];  Service: Orthopedics;  Laterality: Left;  Marland Kitchen MICROLARYNGOSCOPY W/VOCAL CORD INJECTION N/A 08/07/2018   Procedure: MICROLARYNGOSCOPY WITH VOCAL CORD INJECTION OF PROLARYN;  Surgeon: Melida Quitter, MD;  Location: [facility];  Service: ENT;  Laterality: N/A;  JET VENTILATION  . VAGINAL HYSTERECTOMY  1971     OB History   No obstetric history on file.      Home Medications    Prior to Admission medications   Medication Sig Start Date End Date Taking? Authorizing Provider  candesartan (ATACAND) 8 MG tablet TAKE 1  TABLET DAILY 12/17/18   Troy Sine, MD  Cholecalciferol (VITAMIN D3) 2000 units TABS Take 2,000 Units by mouth daily.    [provider]  Cyanocobalamin (VITAMIN B-12 PO) Place 1 mL under the tongue 2 (two) times a week.    [provider]  desoximetasone (TOPICORT) 0.25 % cream APPLY 1 APPLICATION TOPICALLY 2 TIMES DAILY Patient taking differently: Apply 1 application topically 2 (two) times daily as needed (for itching).  08/05/17   Laurey Morale, MD  dextromethorphan (DELSYM) 30 MG/5ML liquid Take 15 mg by mouth daily as needed for cough.     [provider]  dofetilide (TIKOSYN) 125 MCG capsule TAKE 1 CAPSULE TWICE A DAY 08/25/18   Allred, Jeneen Rinks, MD  ELIQUIS 5 MG TABS tablet TAKE 1 TABLET TWICE A DAY 12/30/18   Troy Sine, MD  furosemide (LASIX) 40 MG tablet Take 1 tablet (40 mg total) by mouth daily. 11/25/17   Baldwin Jamaica, PA-C  ipratropium (ATROVENT) 0.06 % nasal spray Place 2 sprays into both nostrils daily as needed for rhinitis.  04/28/18   [provider]  loratadine (CLARITIN) 10 MG tablet Take 10 mg by mouth daily as needed for allergies.     [provider]  Menthol, Topical Analgesic, (BIOFREEZE EX) Apply 1 application topically 3 (three) times daily.    [provider]  Multiple Vitamin (MULTIVITAMIN WITH MINERALS) TABS tablet Take 1 tablet by mouth daily.    [provider]  naproxen sodium (ALEVE) 220 MG tablet Take 220 mg by mouth at bedtime.    [provider]  Omega-3 Fatty Acids (FISH OIL) 1200 MG CAPS Take 1,200 mg by mouth daily.     [provider]  omeprazole (PRILOSEC OTC) 20 MG tablet Take 20 mg by mouth daily as needed (for acid reflux).     [provider]  potassium chloride (MICRO-K) 10 MEQ CR capsule Take 1 capsule (10 mEq total) by mouth 2 (two) times daily. 08/24/18   Laurey Morale, MD  rosuvastatin (CRESTOR) 10 MG tablet Take 1 tablet (10 mg total) by mouth  daily. 09/18/18   Lendon Colonel, NP  triamcinolone (NASACORT) 55 MCG/ACT nasal inhaler Place 1 spray into both nostrils daily as needed (for allergies).  10/17/12   Laurey Morale, MD    Family History Family History  Problem Relation Age of Onset  . Cancer Maternal Grandmother   . Heart disease Maternal Grandfather     Social History Social History   Tobacco Use  . Smoking status: Former Smoker    Years: 40.00    Last attempt to quit: 11/04/1996    Years since quitting: 22.3  . Smokeless tobacco: Never Used  Substance Use Topics  . Alcohol use: No    Alcohol/week: 0.0 standard drinks  . Drug use: No     Allergies   Lidocaine; Morphine; Procaine hcl; Sulfonamide derivatives; Vytorin [ezetimibe-simvastatin]; and Tape  Review of Systems Review of Systems  Cardiovascular: Positive for chest pain.  All other systems reviewed and are negative.    Physical Exam Updated Vital Signs BP (!) 169/62   Pulse (!) 51   Temp 98.6 F (37 C) (Oral)   Resp 14   Ht 5\' 8"  (1.727 m)   Wt 71.2 kg   SpO2 100%   BMI 23.87 kg/m   Physical Exam Vitals signs and nursing note reviewed.  Constitutional:      Appearance: She is well-developed.  HENT:     Head: Normocephalic and atraumatic.  Cardiovascular:     Rate and Rhythm: Regular rhythm. Bradycardia present.     Heart sounds: No murmur.  Pulmonary:     Effort: Pulmonary effort is normal. No respiratory distress.     Breath sounds: Normal breath sounds.  Abdominal:     Palpations: Abdomen is soft.     Tenderness: There is no abdominal tenderness. There is no guarding or rebound.  Musculoskeletal:        General: No swelling or tenderness.  Skin:    General: Skin is warm and dry.     Capillary Refill: Capillary refill takes less than 2 seconds.  Neurological:     Mental Status: She is alert and oriented to person, place, and time.  Psychiatric:        Behavior: Behavior normal.      ED Treatments / Results   Labs (all labs ordered are listed, but only abnormal results are displayed) Labs Reviewed  COMPREHENSIVE METABOLIC PANEL - Abnormal; Notable for the following components:      Result Value   Glucose, Bld 101 (*)    BUN 27 (*)    Creatinine, Ser 1.10 (*)    Calcium 10.4 (*)    AST 52 (*)    GFR calc non Af Amer 48 (*)    GFR calc Af Amer 55 (*)    All other components within normal limits  BRAIN NATRIURETIC PEPTIDE - Abnormal; Notable for the following components:   B Natriuretic Peptide 703.5 (*)    All other components within normal limits  TROPONIN I - Abnormal; Notable for the following components:   Troponin I 1.48 (*)    All other components within normal limits  POCT I-STAT EG7 - Abnormal; Notable for the following components:   pH, Ven 7.438 (*)    Bicarbonate 30.3 (*)    Acid-Base Excess 5.0 (*)    All other components within normal limits  CBC WITH DIFFERENTIAL/PLATELET  I-STAT CREATININE, ED  CBG MONITORING, ED    EKG EKG Interpretation  Date/Time:  Thursday February 25 2019 11:16:48 EDT Ventricular Rate:  48 PR Interval:    QRS Duration: 89 QT Interval:  483 QTC Calculation: 432 R Axis:   65 Text Interpretation:  Sinus bradycardia LVH with secondary repolarization abnormality Confirmed by Quintella Reichert [phone]) on 02/25/2019 11:20:01 AM Also confirmed by Quintella Reichert [id number]), editor Hattie Perch [phone])  on 02/25/2019 1:22:25 PM   Radiology Dg Chest Port 1 View  Result Date: 02/25/2019 CLINICAL DATA:  Chest pain. EXAM: PORTABLE CHEST 1 VIEW COMPARISON:  Radiographs of November 16, 2017. FINDINGS: Stable cardiomegaly. No pneumothorax or pleural effusion is noted. Right lung is clear. Mild left basilar atelectasis or infiltrate is noted. Bony thorax is unremarkable. IMPRESSION: Mild left basilar atelectasis or infiltrate is noted. Electronically Signed   By: Marijo Conception M.D.   On: 02/25/2019 11:47    Procedures Procedures (  including critical care  time) CRITICAL CARE Performed by: Quintella Reichert   Total critical care time: 35 minutes  Critical care time was exclusive of separately billable procedures and treating other patients.  Critical care was necessary to treat or prevent imminent or life-threatening deterioration.  Critical care was time spent personally by me on the following activities: development of treatment plan with patient and/or surrogate as well as nursing, discussions with consultants, evaluation of patient's response to treatment, examination of patient, obtaining history from patient or surrogate, ordering and performing treatments and interventions, ordering and review of laboratory studies, ordering and review of radiographic studies, pulse oximetry and re-evaluation of patient's condition.  Medications Ordered in ED Medications  heparin ADULT infusion 100 units/mL (25000 units/239mL sodium chloride 0.45%) (has no administration in time range)  irbesartan (AVAPRO) tablet 75 mg (has no administration in time range)  rosuvastatin (CRESTOR) tablet 40 mg (has no administration in time range)  aspirin chewable tablet 324 mg (324 mg Oral Given 02/25/19 1154)     Initial Impression / Assessment and Plan / ED Course  I have reviewed the triage vital signs and the nursing notes.  Pertinent labs & imaging results that were available during my care of the patient were reviewed by me and considered in my medical decision making (see chart for details).        Patient here for evaluation of chest pain after severe chest pain last night. She is pain free on evaluation the emergency department. EKG with bradycardia and significant ST changes, slightly worse when compared to priors. Troponin is elevated at 1.48. Cardiology consulted for non-ST elevation MI. Patient updated of findings of studies recommendation for admission and she is in agreement with treatment plan.  Final Clinical Impressions(s) / ED Diagnoses   Final  diagnoses:  NSTEMI (non-ST elevated myocardial infarction) [facility])    ED Discharge Orders    None       Quintella Reichert, MD 02/25/19 1439

## 2019-02-25 NOTE — ED Triage Notes (Addendum)
Pt complains of fluttering in chest/ chest pain since last night. EMS came today for CP. Pt in and out of Afib with hx, rate controlled HR 60s.  BP 200/90. Pt is alert and oriented. 98% on room air.

## 2019-02-25 NOTE — Telephone Encounter (Signed)
New Message    Pt c/o of Chest Pain: STAT if CP now or developed within 24 hours  1. Are you having CP right now? Lightened up now but had it most of the night last night   2. Are you experiencing any other symptoms (ex. SOB, nausea, vomiting, sweating)? Through out the night, Hot and cold flashes, nauseas, SOB  Better this morning   3. How long have you been experiencing CP? Started about 1130pm   4. Is your CP continuous or coming and going? Continuous   5. Have you taken Nitroglycerin? No  ?

## 2019-02-25 NOTE — ED Notes (Signed)
Helped get patient on the monitor did ekg shown to Dr Ralene Bathe patient is resting with nurses at bedside and call bell in reach

## 2019-02-25 NOTE — Progress Notes (Signed)
Patient without complaints, denies chest pain 0/10. Resting comfortably in the bed.

## 2019-02-25 NOTE — ED Notes (Signed)
Rounded on patient, she is alert, oriented, reviewed plan of care with her, pending results of bloodwork currently. Pt ambulated to bathroom with this RN. Denies pain at present. States she is able to update her family of status with her personal cellphone.   Will provide my phone number to patient if she would need further assistance today.

## 2019-02-25 NOTE — H&P (Addendum)
History & Physical    Patient ID: Barbara [name] MRN: [id number], DOB/AGE: [birth year]   Admit date: 02/25/2019  Primary Physician: Laurey Morale, MD Primary Cardiologist: Dr. Shelva Majestic, MD/Dr. Thompson Grayer, MD  Patient Profile    Barbara [name] is a 78 year old female with a history of CAD s/p PCI to high-grade LAD stenosis in 09/1999, atrial fibrillation followed by Dr. Rayann Heman as well as the atrial fibrillation clinic, hypertension, hyperlipidemia and chronic lower extremity edema who presented to [facility] 02/25/2019 with complaints of chest pain found to have non-STEMI.  Past Medical History   Past Medical History:  Diagnosis Date   A-fib [facility])    Allergy    CAD (coronary artery disease)    sees Dr. Shelva Majestic  cardiac stents - 2000   CHF (congestive heart failure) ([place])    Colon polyps    Dyspnea    02/12/18 " when my heart gets out of rhythm, it has not been out of rhythym- since I have been on Tikosyn (11/2017)   Dysrhythmia    afib fib   GERD (gastroesophageal reflux disease)    takes OTC- Omeprazole- prn   Heart murmur    History of stress test    show normal perfusion without scar or ischemia, post EF 68%   Hx of echocardiogram    show an EF 55%-60% range with grade 1 diastolic dysfunction, she had mitral anular calcification with mild MR, moderate LA dilation and mild pulmonary hypertension with a PA estimated pressure of 94mm   Hyperlipidemia    Hypertension    Osteoarthritis    Pneumonia    hx of 2015    PONV (postoperative nausea and vomiting)     Past Surgical History:  Procedure Laterality Date   CARDIAC CATHETERIZATION     11/2017   cardiac stents  2000   COLONOSCOPY  01-05-14   per Dr. Teena Irani, clear, no repeats needed    CORONARY STENT PLACEMENT  2000   in LAD   DIRECT LARYNGOSCOPY WITH RADIAESSE INJECTION N/A 02/13/2018   Procedure: DIRECT LARYNGOSCOPY WITH RADIAESSE INJECTION;  Surgeon: Melida Quitter, MD;   Location: [facility] OR;  Service: ENT;  Laterality: N/A;   KNEE ARTHROSCOPY Left 01/06/2015   Procedure: LEFT KNEE ARTHROSCOPY, abrasion chondroplasty of the medial femerol condryl,medial and lateral menisectomy, microfracture , synovectomy of the suprpatellar pouch;  Surgeon: Latanya Maudlin, MD;  Location: [facility] ORS;  Service: Orthopedics;  Laterality: Left;   MICROLARYNGOSCOPY W/VOCAL CORD INJECTION N/A 08/07/2018   Procedure: MICROLARYNGOSCOPY WITH VOCAL CORD INJECTION OF PROLARYN;  Surgeon: Melida Quitter, MD;  Location: [place];  Service: ENT;  Laterality: N/A;  JET VENTILATION   VAGINAL HYSTERECTOMY  1971    Allergies  Allergies  Allergen Reactions   Lidocaine Anaphylaxis   Morphine Other (See Comments)    "Body shuts down"   Procaine Hcl Rash and Other (See Comments)    "Anything with 'caine' in it " [ANAPHYLAXIS TO LIDOCAINE]   Sulfonamide Derivatives Hives   Vytorin [Ezetimibe-Simvastatin] Other (See Comments)    UNSPECIFIED REACTION    Tape Itching   History of Present Illness    Barbara [name] is a 78 year old female with a history stated above who presented to [facility] on 02/25/2019 with complaints chest pain which began late yesterday evening approximately 11:30 PM.  She reported that she began having pressure type pain on the left side of her chest with radiation down her left arm with associated shortness of breath and mild  diaphoresis which lasted most of the evening.  When she woke this morning, the pain was less intense however was still present.  Given this she called our office triage line for assistance.  It was recommended that she call EMS for transport to the emergency department.  She stated that she did not have nitroglycerin available for relief.  Per chart review, upon ED arrival her pain had mostly resolved however she had continued reports of persistent shortness of breath and palpitations.   In the ED, EKG revealed SB with T wave inversions throughout as well as ST sloping  which appears to be more pronounced than prior tracing on 08/07/2018 but similar.  Troponin found to be markedly elevated at 1.48.  BNP elevated at 703.  Creatinine stable at 1.1.  TSH within normal limits at 1.85.  CXR performed which showed mild left basilar atelectasis or infiltrate with stable cardiomegaly.    Barbara [name] was last seen by our service on 08/24/2018 in follow-up for CAD.  Previously she was seen by Dr. Claiborne Billings 01/13/2018 and denied awareness of recurrent atrial fibrillation.  She was continued on a diuretic and losartan for her hypertension.  She had no complaints of bleeding on Eliquis and she was also continued on Tikosyn for maintenance of normal sinus rhythm in the setting of PAF. She was last followed in the A. fib clinic by Roderic Palau, NP on 03/17/2018.  At that time she was asymptomatic and tolerating medications well. During her last office visit she continued to have dyspnea on exertion along with daytime somnolence and was referred for an outpatient sleep study and found to have severe OSA.  Last echocardiogram performed 11/17/2017 with LVEF of 70 to 75% with no wall motion abnormalities.  There was evidence of dynamic obstruction at the outflow tract with a peak velocity of 289 cm/s and a peak gradient of 33 mmHg as well as grade 2 diastolic dysfunction.  Per study report, morphology not consistent with hypertrophic cardiomyopathy, there continued to be a mild increase in subvalvular outflow tract gradient due to hyperdynamic LV contraction and increased stroke-volume.  Barbara [name] had a normal myocardial perfusion study 10/06/2014.  Home Medications    Prior to Admission medications   Medication Sig Start Date End Date Taking? Authorizing Provider  candesartan (ATACAND) 8 MG tablet TAKE 1 TABLET DAILY 12/17/18   Troy Sine, MD  Cholecalciferol (VITAMIN D3) 2000 units TABS Take 2,000 Units by mouth daily.    [provider]  Cyanocobalamin (VITAMIN B-12 PO)  Place 1 mL under the tongue 2 (two) times a week.    [provider]  desoximetasone (TOPICORT) 0.25 % cream APPLY 1 APPLICATION TOPICALLY 2 TIMES DAILY Patient taking differently: Apply 1 application topically 2 (two) times daily as needed (for itching).  08/05/17   Laurey Morale, MD  dextromethorphan (DELSYM) 30 MG/5ML liquid Take 15 mg by mouth daily as needed for cough.     [provider]  dofetilide (TIKOSYN) 125 MCG capsule TAKE 1 CAPSULE TWICE A DAY 08/25/18   Allred, Jeneen Rinks, MD  ELIQUIS 5 MG TABS tablet TAKE 1 TABLET TWICE A DAY 12/30/18   Troy Sine, MD  furosemide (LASIX) 40 MG tablet Take 1 tablet (40 mg total) by mouth daily. 11/25/17   Baldwin Jamaica, PA-C  ipratropium (ATROVENT) 0.06 % nasal spray Place 2 sprays into both nostrils daily as needed for rhinitis.  04/28/18   [provider]  loratadine (CLARITIN) 10 MG tablet  Take 10 mg by mouth daily as needed for allergies.     [provider]  Menthol, Topical Analgesic, (BIOFREEZE EX) Apply 1 application topically 3 (three) times daily.    [provider]  Multiple Vitamin (MULTIVITAMIN WITH MINERALS) TABS tablet Take 1 tablet by mouth daily.    [provider]  naproxen sodium (ALEVE) 220 MG tablet Take 220 mg by mouth at bedtime.    [provider]  Omega-3 Fatty Acids (FISH OIL) 1200 MG CAPS Take 1,200 mg by mouth daily.     [provider]  omeprazole (PRILOSEC OTC) 20 MG tablet Take 20 mg by mouth daily as needed (for acid reflux).     [provider]  potassium chloride (MICRO-K) 10 MEQ CR capsule Take 1 capsule (10 mEq total) by mouth 2 (two) times daily. 08/24/18   Laurey Morale, MD  rosuvastatin (CRESTOR) 10 MG tablet Take 1 tablet (10 mg total) by mouth daily. 09/18/18   Lendon Colonel, NP  triamcinolone (NASACORT) 55 MCG/ACT nasal inhaler Place 1 spray into both nostrils daily as needed (for allergies).  10/17/12   Laurey Morale, MD     Family History    Family History  Problem Relation Age of Onset   Cancer Maternal Grandmother    Heart disease Maternal Grandfather     Social History    Social History   Socioeconomic History   Marital status: Married    Spouse name: Not on file   Number of children: Not on file   Years of education: Not on file   Highest education level: Not on file  Occupational History   Not on file  Social Needs   Financial resource strain: Not on file   Food insecurity:    Worry: Not on file    Inability: Not on file   Transportation needs:    Medical: Not on file    Non-medical: Not on file  Tobacco Use   Smoking status: Former Smoker    Years: 40.00    Last attempt to quit: 11/04/1996    Years since quitting: 22.3   Smokeless tobacco: Never Used  Substance and Sexual Activity   Alcohol use: No    Alcohol/week: 0.0 standard drinks   Drug use: No   Sexual activity: Not on file  Lifestyle   Physical activity:    Days per week: Not on file    Minutes per session: Not on file   Stress: Not on file  Relationships   Social connections:    Talks on phone: Not on file    Gets together: Not on file    Attends religious service: Not on file    Active member of club or organization: Not on file    Attends meetings of clubs or organizations: Not on file    Relationship status: Not on file   Intimate partner violence:    Fear of current or ex partner: Not on file    Emotionally abused: Not on file    Physically abused: Not on file    Forced sexual activity: Not on file  Other Topics Concern   Not on file  Social History Narrative   Not on file    Review of Systems   See HPI no fevers, chills or productive cough. All other systems reviewed and are otherwise negative except as noted above.  Physical Exam    Blood pressure (!) 161/50, pulse (!) 49, temperature 98.6 F (37 C), temperature  source Oral, resp. rate 16, height 5\' 8"  (1.727 m), weight  71.2 kg, SpO2 99 %.   General: Pleasant, well-nourished, A&O in NAD, appears stated age Psych: Normal affect. Neuro: Alert and oriented X 3. Moves all extremities spontaneously. HEENT: Eyes symmetric, no orbital edema. Conjunctive pink, sclera white, PERRLA, vision grossly intact.  Neck: Supple neck veins, no bruits or JVD.Trachea midline, no cervical lymphadenopathy.  Lungs: Symmetrical chest wall expansion.CTA w/ RRR. No accessory muscle use.  Heart: Regular and bradycardic.  1/6 systolic ejection murmur. Abdomen: Soft, non-tender, non-distended, BS + x 4.  Extremities: No clubbing, cyanosis or edema. DP/PT/Radials 2+ and equal bilaterally. MSK: No palpable chest wall or thoracic tenderness  Labs     Recent Labs    02/25/19 1122  TROPONINI 1.48*   Lab Results  Component Value Date   WBC 6.6 02/25/2019   HGB 13.6 02/25/2019   HCT 40.0 02/25/2019   MCV 89.0 02/25/2019   PLT 197 02/25/2019    Recent Labs  Lab 02/25/19 1122 02/25/19 1129 02/25/19 1130  NA 139  --  140  K 4.9  --  4.6  CL 103  --   --   CO2 26  --   --   BUN 27*  --   --   CREATININE 1.10* 1.00  --   CALCIUM 10.4*  --   --   PROT 7.5  --   --   BILITOT 1.0  --   --   ALKPHOS 78  --   --   ALT 29  --   --   AST 52*  --   --   GLUCOSE 101*  --   --    Lab Results  Component Value Date   CHOL 156 11/19/2018   HDL 57.90 11/19/2018   LDLCALC 74 11/19/2018   TRIG 117.0 11/19/2018    Radiology Studies    Dg Chest Port 1 View  Result Date: 02/25/2019 CLINICAL DATA:  Chest pain. EXAM: PORTABLE CHEST 1 VIEW COMPARISON:  Radiographs of November 16, 2017. FINDINGS: Stable cardiomegaly. No pneumothorax or pleural effusion is noted. Right lung is clear. Mild left basilar atelectasis or infiltrate is noted. Bony thorax is unremarkable. IMPRESSION: Mild left basilar atelectasis or infiltrate is noted. Electronically Signed   By: Marijo Conception M.D.   On: 02/25/2019 11:47   ECG & Cardiac Imaging     Echocardiogram 11/17/2017 Left ventricle: The cavity size was normal. Systolic function was vigorous. The estimated ejection fraction was in the range of 70% to 75%. There was dynamic obstruction at restin the outflow tract, with a peak velocity of 289 cm/sec and a peak gradient of 33 mm Hg. Wall motion was normal; there were no regional wall motion abnormalities. Features are consistent with a pseudonormal left ventricular filling pattern, with concomitant abnormal relaxation and increased filling pressure (grade 2 diastolic dysfunction). - Aortic valve: Valve area (VTI): 2.07 cm^2. Valve area (Vmax): 2.08 cm^2. Valve area (Vmean): 2.3 cm^2. - Mitral valve: There was mild to moderate regurgitation directed centrally. - Left atrium: The atrium was moderately to severely dilated. - Right atrium: The atrium was moderately dilated. - Pulmonary arteries: PA peak pressure: 53 mm Hg (S).  Impressions:  - Although the morphology is not consistent with hypertrophic cardiomyopathy, there is a mildly increased subvalvular outflow tract gradient due to hyperdynamic LV contraction and increased stroke volume.  Assessment & Plan   1. NSTEMI: -Patient presented from home with complaints of chest pain  which began yesterday evening approximately 11:30 PM.  Her discomfort was associated with shortness of breath as well as diaphoresis and left arm radiation.  She reported her pain was relatively constant overnight and although was less intense, was still present this morning.  Patient called on-call triage team with our office with recommendations to call EMS for further evaluation. -Upon ED arrival, EKG with NSR with diffuse T wave inversions and ST sloping.  Tracing from 08/2018 appears similar however T wave and sloping abnormalities appear more pronounced at this time  -Troponin found to be elevated at 1.48>>> continue to trend -BNP elevated at 703 with CXR consistent  with fluid volume overload -Patient placed on IV heparin per pharmacy for ACS and was given ASA 324 mg -Home medications include Crestor 10 mg, p.o. Lasix 40 mg daily, Eliquis 5 mg twice daily and Tikosyn 125 mcg twice daily -Given the above, patient would benefit from further ischemic evaluation with cardiac catheterization -Creatinine stable at 1.00  2. Paroxysmal atrial fibrillation: -Followed with Dr. Rayann Heman as well as Roderic Palau, NP in the [facility] -Per chart review, patient reported going in and out of Afib on telemetry monitor with initial complaints of palpitations on presentation -EKG shows NSR/SB with rate control -On home Eliquis 5 mg twice daily as well as Tikosyn 125 mcg twice daily -QTC per last EKG today, 482ms -Intolerant to beta-blockers secondary to weakness, dizziness and sinus bradycardia -CHA2DS2VASc=5  3. Hypertension: -Elevated, 161/50, 149/56, 173/62 -Not currently taking antihypertensives -Would consider amlodipine 5mg  and titrate medications as tolerated -No beta-blocker secondary to intolerance with bradycardia   4.  Bradycardia: -Patient has a known history of bradycardia -Monitor closely on telemetry -Avoid AV blocking agents  5. OSA on CPAP: -Patient had continued complaints of dyspnea on exertion and daytime somnolence at last office visit and requested evaluation with a sleep study -Sleep study completed 10/07/2018>> found to have severe obstructive sleep apnea  6. Hyperlipidemia: -Stable, last LDL 74 on 11/19/2018 -Continue statin, would uptitrate this   Severity of Illness: The appropriate patient status for this patient is INPATIENT. Inpatient status is judged to be reasonable and necessary in order to provide the required intensity of service to ensure the patient's safety. The patient's presenting symptoms, physical exam findings, and initial radiographic and laboratory data in the context of their chronic comorbidities is felt to place them  at high risk for further clinical deterioration. Furthermore, it is not anticipated that the patient will be medically stable for discharge from the hospital within 2 midnights of admission. The following factors support the patient status of inpatient.   " The patient's presenting symptoms include NSTEMI. " The worrisome physical exam findings include elevated BP and bradycardia. " The initial radiographic and laboratory data are worrisome because of abnormal troponin. " The chronic co-morbidities include hypertension, PAF, hyperlipidemia.   * I certify that at the point of admission it is my clinical judgment that the patient will require inpatient hospital care spanning beyond 2 midnights from the point of admission due to high intensity of service, high risk for further deterioration and high frequency of surveillance required.*     Signed, Kathyrn Drown NP-C [facility] Pager: [phone] 02/25/2019, @NOW   As above, patient seen and examined.  Briefly she is a 78 year old female with past medical history of coronary artery disease with PCI of LAD in 2000, paroxysmal atrial fibrillation, hypertension, hyperlipidemia admitted with non-ST elevation myocardial infarction.  Patient developed chest pain last evening described as a  pressure/heaviness radiating to her left upper extremity and back.  There was associated dyspnea but no nausea, vomiting or diaphoresis.  There was question increased with lying on left side.  The pain persisted throughout the night and she called EMS this morning.  Upon arrival she is now pain-free.  Electrocardiogram shows sinus bradycardia, left ventricular hypertrophy, inferolateral T wave inversion.  Creatinine 1.10.  BNP 703.  Troponin I 0.48. 1 non-ST elevation myocardial infarction-patient has ruled in.  She is presently pain-free.  Plan to hold Eliquis.  She will require cardiac catheterization tomorrow.  The risks and benefits including myocardial infarction, CVA  and death discussed and she agrees to proceed.  Treat with aspirin, heparin and add nitroglycerin paste.  If recurrent symptoms can change to IV nitroglycerin.  No beta-blocker given baseline bradycardia.  Continue statin.  Hydrate prior to catheterization. 2 paroxysmal atrial fibrillation-patient is in sinus rhythm.  Continue Tikosyn at present dose.  Hold apixaban in anticipation of cardiac catheterization. Resume following procedure; CHADSvasc 5. 3 hypertension-blood pressure is mildly elevated.  Continue ARB but hold Lasix in anticipation of catheterization.  We will also hold potassium.  Follow blood pressure and adjust regimen as needed. 4 hyperlipidemia-continue Crestor but increase to 40 mg daily given documented coronary disease.  Check lipids and liver in 6 to 8 weeks.  Kirk Ruths, MD

## 2019-02-25 NOTE — Telephone Encounter (Signed)
Noted, patient currently at hospital.  Thanks!

## 2019-02-25 NOTE — Telephone Encounter (Signed)
Pt called to report that last night she laid down to go to sleep about 11:30om and she started having a pressure type pain on the left side of her chest and radiating down her left arm. She had SOB associated with it.. she says she was scared to get up so she just laid there and the pain lasted all night..she was diaphoretic at times.  The pain this morning is still there and it is a little better a 7 out of 10.  She does not have nitro at home and is not sure of her BP.  Pt to call EMS to come and assess her. I urged her to let them take her to the ER if they recommend she goes. She verbalized understanding and agreed.

## 2019-02-26 ENCOUNTER — Encounter (HOSPITAL_COMMUNITY)
Admission: EM | Disposition: A | Discharge status: Home / Self Care | Payer: Self-pay | Source: Home / Self Care | Attending: Cardiology

## 2019-02-26 DIAGNOSIS — I4891 Unspecified atrial fibrillation: Secondary | ICD-10-CM

## 2019-02-26 DIAGNOSIS — E785 Hyperlipidemia, unspecified: Secondary | ICD-10-CM

## 2019-02-26 HISTORY — PX: LEFT HEART CATH AND CORONARY ANGIOGRAPHY: CATH118249

## 2019-02-26 LAB — BASIC METABOLIC PANEL
Anion gap: 11 (ref 5–15)
BUN: 28 mg/dL — ABNORMAL HIGH (ref 8–23)
CO2: 24 mmol/L (ref 22–32)
Calcium: 10 mg/dL (ref 8.9–10.3)
Chloride: 105 mmol/L (ref 98–111)
Creatinine, Ser: 1.27 mg/dL — ABNORMAL HIGH (ref 0.44–1.00)
GFR calc Af Amer: 46 mL/min — ABNORMAL LOW (ref >60)
GFR calc non Af Amer: 40 mL/min — ABNORMAL LOW (ref >60)
Glucose, Bld: 98 mg/dL (ref 70–99)
Potassium: 4.2 mmol/L (ref 3.5–5.1)
Sodium: 140 mmol/L (ref 135–145)

## 2019-02-26 LAB — PROTIME-INR
INR: 1.3 — ABNORMAL HIGH (ref 0.8–1.2)
Prothrombin Time: 15.7 seconds — ABNORMAL HIGH (ref 11.4–15.2)

## 2019-02-26 LAB — CBC
HCT: 37 % (ref 36.0–46.0)
Hemoglobin: 12.2 g/dL (ref 12.0–15.0)
MCH: 29.2 pg (ref 26.0–34.0)
MCHC: 33 g/dL (ref 30.0–36.0)
MCV: 88.5 fL (ref 80.0–100.0)
Platelets: 175 10*3/uL (ref 150–400)
RBC: 4.18 MIL/uL (ref 3.87–5.11)
RDW: 12.9 % (ref 11.5–15.5)
WBC: 6.4 10*3/uL (ref 4.0–10.5)
nRBC: 0 % (ref 0.0–0.2)

## 2019-02-26 LAB — APTT
aPTT: 113 seconds — ABNORMAL HIGH (ref 24–36)
aPTT: 157 seconds — ABNORMAL HIGH (ref 24–36)

## 2019-02-26 LAB — TROPONIN I: Troponin I: 1.36 ng/mL (ref <0.03)

## 2019-02-26 LAB — HEPARIN LEVEL (UNFRACTIONATED)
Heparin Unfractionated: >2.2 IU/mL — ABNORMAL HIGH (ref 0.30–0.70)
Heparin Unfractionated: >2.2 IU/mL — ABNORMAL HIGH (ref 0.30–0.70)

## 2019-02-26 SURGERY — LEFT HEART CATH AND CORONARY ANGIOGRAPHY
Anesthesia: LOCAL

## 2019-02-26 MED ORDER — HEPARIN (PORCINE) IN NACL 1000-0.9 UT/500ML-% IV SOLN
INTRAVENOUS | Status: AC
  Filled 2019-02-26: qty 1000

## 2019-02-26 MED ORDER — DIPHENHYDRAMINE HCL 50 MG/ML IJ SOLN
INTRAMUSCULAR | Status: AC
  Filled 2019-02-26: qty 1

## 2019-02-26 MED ORDER — VERAPAMIL HCL 2.5 MG/ML IV SOLN
INTRAVENOUS | Status: DC | PRN
  Administered 2019-02-26: 14:00:00 via INTRA_ARTERIAL

## 2019-02-26 MED ORDER — MIDAZOLAM HCL 2 MG/2ML IJ SOLN
INTRAMUSCULAR | Status: DC | PRN
  Administered 2019-02-26: 1 mg via INTRAVENOUS

## 2019-02-26 MED ORDER — DIPHENHYDRAMINE HCL 50 MG/ML IJ SOLN
INTRAMUSCULAR | Status: DC | PRN
  Administered 2019-02-26: 25 mg via INTRAVENOUS

## 2019-02-26 MED ORDER — IOHEXOL 350 MG/ML SOLN
INTRAVENOUS | Status: DC | PRN
  Administered 2019-02-26: 75 mL via INTRAVENOUS

## 2019-02-26 MED ORDER — HYDRALAZINE HCL 20 MG/ML IJ SOLN: 10.0000 mg | INTRAMUSCULAR | Status: AC | PRN

## 2019-02-26 MED ORDER — SODIUM CHLORIDE 0.9 % IV SOLN
INTRAVENOUS | Status: DC
  Administered 2019-02-27: 02:00:00 via INTRAVENOUS

## 2019-02-26 MED ORDER — HEPARIN SODIUM (PORCINE) 1000 UNIT/ML IJ SOLN
INTRAMUSCULAR | Status: DC | PRN
  Administered 2019-02-26: 3500 [IU] via INTRAVENOUS

## 2019-02-26 MED ORDER — SODIUM CHLORIDE 0.9 % IV SOLN: 250.0000 mL | INTRAVENOUS | Status: DC | PRN

## 2019-02-26 MED ORDER — ACETAMINOPHEN 325 MG PO TABS: 650.0000 mg | ORAL_TABLET | Freq: Four times a day (QID) | ORAL | Status: DC | PRN

## 2019-02-26 MED ORDER — SODIUM CHLORIDE 0.9% FLUSH: 3.0000 mL | INTRAVENOUS | Status: DC | PRN

## 2019-02-26 MED ORDER — LIDOCAINE HCL (PF) 1 % IJ SOLN
INTRAMUSCULAR | Status: DC | PRN
  Administered 2019-02-26: 2 mL

## 2019-02-26 MED ORDER — SODIUM CHLORIDE 0.9% FLUSH
3.0000 mL | Freq: Two times a day (BID) | INTRAVENOUS | Status: DC
  Administered 2019-02-27 – 2019-02-28 (×2): 3 mL via INTRAVENOUS

## 2019-02-26 MED ORDER — HEPARIN SODIUM (PORCINE) 1000 UNIT/ML IJ SOLN
INTRAMUSCULAR | Status: AC
  Filled 2019-02-26: qty 1

## 2019-02-26 MED ORDER — LABETALOL HCL 5 MG/ML IV SOLN: 10.0000 mg | INTRAVENOUS | Status: AC | PRN

## 2019-02-26 MED ORDER — LIDOCAINE HCL (PF) 1 % IJ SOLN
INTRAMUSCULAR | Status: AC
  Filled 2019-02-26: qty 30

## 2019-02-26 MED ORDER — MIDAZOLAM HCL 2 MG/2ML IJ SOLN
INTRAMUSCULAR | Status: AC
  Filled 2019-02-26: qty 2

## 2019-02-26 MED ORDER — VERAPAMIL HCL 2.5 MG/ML IV SOLN
INTRAVENOUS | Status: AC
  Filled 2019-02-26: qty 2

## 2019-02-26 MED ORDER — ALUM & MAG HYDROXIDE-SIMETH 200-200-20 MG/5ML PO SUSP
15.0000 mL | ORAL | Status: DC | PRN
  Administered 2019-02-26: 15 mL via ORAL
  Filled 2019-02-26: qty 30

## 2019-02-26 MED ORDER — HEPARIN (PORCINE) IN NACL 1000-0.9 UT/500ML-% IV SOLN
INTRAVENOUS | Status: DC | PRN
  Administered 2019-02-26 (×2): 500 mL

## 2019-02-26 MED ORDER — ONDANSETRON HCL 4 MG/2ML IJ SOLN: 4.0000 mg | Freq: Four times a day (QID) | INTRAMUSCULAR | Status: DC | PRN

## 2019-02-26 MED ORDER — ACETAMINOPHEN 325 MG PO TABS: 650.0000 mg | ORAL_TABLET | ORAL | Status: DC | PRN

## 2019-02-26 SURGICAL SUPPLY — 11 items
CATH INFINITI 5FR ANG PIGTAIL (CATHETERS) ×1 IMPLANT
CATH OPTITORQUE TIG 4.0 5F (CATHETERS) ×1 IMPLANT
DEVICE RAD COMP TR BAND LRG (VASCULAR PRODUCTS) ×1 IMPLANT
GLIDESHEATH SLEND SS 6F .021 (SHEATH) ×1 IMPLANT
GUIDEWIRE INQWIRE 1.5J.035X260 (WIRE) IMPLANT
INQWIRE 1.5J .035X260CM (WIRE) ×2
KIT HEART LEFT (KITS) ×2 IMPLANT
PACK CARDIAC CATHETERIZATION (CUSTOM PROCEDURE TRAY) ×2 IMPLANT
SYR MEDRAD MARK 7 150ML (SYRINGE) ×2 IMPLANT
TRANSDUCER W/STOPCOCK (MISCELLANEOUS) ×2 IMPLANT
TUBING CIL FLEX 10 FLL-RA (TUBING) ×2 IMPLANT

## 2019-02-26 NOTE — Progress Notes (Signed)
ANTICOAGULATION CONSULT NOTE - Follow up [place] for heparin Indication: chest pain/ACS  Allergies  Allergen Reactions  . Lidocaine Anaphylaxis  . Morphine Other (See Comments)    "Body shuts down"  . Procaine Hcl Anaphylaxis, Rash and Other (See Comments)    "Anything with 'caine' in it "  . Sulfonamide Derivatives Hives  . Vytorin [Ezetimibe-Simvastatin] Other (See Comments)    Unknown  . Tape Itching    Patient Measurements: Height: 5\' 8"  (172.7 cm) Weight: 157 lb 6.4 oz (71.4 kg) IBW/kg (Calculated) : 63.9 Heparin Dosing Weight: 71.2kg  Vital Signs: Temp: 98.7 F (37.1 C) (04/24 0352) Temp Source: Oral (04/24 0352) BP: 124/60 (04/24 0352) Pulse Rate: 55 (04/24 0352)  Labs: Recent Labs    02/25/19 1122 02/25/19 1129 02/25/19 1130 02/25/19 1613 02/25/19 2040 02/26/19 0335  HGB 14.1  --  13.6  --   --  12.2  HCT 43.0  --  40.0  --   --  37.0  PLT 197  --   --   --   --  175  APTT  --   --   --   --   --  113*  LABPROT  --   --   --   --   --  15.7*  INR  --   --   --   --   --  1.3*  HEPARINUNFRC  --   --   --   --   --  >2.20*  CREATININE 1.10* 1.00  --   --   --   --   TROPONINI 1.48*  --   --  1.76* 1.20*  --     Estimated Creatinine Clearance: 46 mL/min (by C-G formula based on SCr of 1 mg/dL).   Medical History: Past Medical History:  Diagnosis Date  . A-fib ([place])   . Allergy   . CAD (coronary artery disease)    sees Dr. Shelva Majestic  cardiac stents - 2000  . CHF (congestive heart failure) ([place])   . Colon polyps   . Dyspnea    02/12/18 " when my heart gets out of rhythm, it has not been out of rhythym- since I have been on Tikosyn (11/2017)  . Dysrhythmia    afib fib  . GERD (gastroesophageal reflux disease)    takes OTC- Omeprazole- prn  . Heart murmur   . History of stress test    show normal perfusion without scar or ischemia, post EF 68%  . Hx of echocardiogram    show an EF 55%-60% range with grade 1 diastolic  dysfunction, she had mitral anular calcification with mild MR, moderate LA dilation and mild pulmonary hypertension with a PA estimated pressure of 44mm  . Hyperlipidemia   . Hypertension   . NSTEMI (non-ST elevated myocardial infarction) ([place])   . Osteoarthritis   . Pneumonia    hx of 2015     Medications:  Infusions:  . sodium chloride    . sodium chloride 3 mL/kg/hr (02/26/19 0423)   Followed by  . sodium chloride    . heparin 900 Units/hr (02/25/19 2042)    Assessment: 64 yof presented to the ED with CP. Troponin elevated and on IV heparin for NSTEMI. She is on apixaban PTA with her last dose 4/23 at 8am.  Heparin level is falsely elevated with recent apixaban use and therefore will use aPTT to dose heparin until levels correlate.   aPTT is slightly supra-therapeutic at 113  seconds. CBC is WNL and no bleeding noted.   Goal of Therapy:  Heparin level 0.3-0.7 units/ml aPTT 66-103 seconds Monitor platelets by anticoagulation protocol: Yes   Plan:  Decrease heparin IV to 850 units/hr Check an 8 hr heparin level and aPTT Daily heparin level, aPTT and CBC Continue to monitor for bleeding  Vertis Kelch, PharmD PGY1 Pharmacy Resident Phone [phone] 02/26/2019       4:58 AM

## 2019-02-26 NOTE — Progress Notes (Signed)
Progress Note  Patient Name: Barbara [name] Date of Encounter: 02/26/2019  Primary Cardiologist: Shelva Majestic, MD / Dr. Rayann Heman  Subjective   No CP or dyspnea this AM  Inpatient Medications    Scheduled Meds: . [START ON 02/27/2019] aspirin EC  81 mg Oral Daily  . dofetilide  125 mcg Oral BID  . irbesartan  75 mg Oral Daily  . nitroGLYCERIN  0.5 inch Topical Q6H  . rosuvastatin  40 mg Oral Daily  . sodium chloride flush  3 mL Intravenous Q12H   Continuous Infusions: . sodium chloride    . sodium chloride 1 mL/kg/hr (02/26/19 0549)  . heparin 850 Units/hr (02/26/19 0548)   PRN Meds: sodium chloride, acetaminophen, nitroGLYCERIN, sodium chloride flush   Vital Signs    Vitals:   02/25/19 1640 02/25/19 1748 02/25/19 1935 02/26/19 0352  BP: (!) 176/57 (!) 155/45 (!) 125/43 124/60  Pulse: (!) 49 (!) 50 (!) 51 (!) 55  Resp: 17 18 18 18   Temp: 97.7 F (36.5 C)  97.9 F (36.6 C) 98.7 F (37.1 C)  TempSrc: Oral  Oral Oral  SpO2: 100% 100% 97% 96%  Weight:  70.5 kg  71.4 kg  Height:  5\' 8"  (1.727 m)      Intake/Output Summary (Last 24 hours) at 02/26/2019 5573 Last data filed at 02/26/2019 0559 Gross per 24 hour  Intake 556 ml  Output 400 ml  Net 156 ml   Last 3 Weights 02/26/2019 02/25/2019 02/25/2019  Weight (lbs) 157 lb 6.4 oz 155 lb 6 oz 157 lb  Weight (kg) 71.396 kg 70.478 kg 71.215 kg      Telemetry    Sinus bradycardia - Personally Reviewed  ECG    NSR with TWI in inferolateral leads - Personally Reviewed  Physical Exam   GEN: No acute distress.   Neck: No JVD Cardiac: RRR, no murmurs, rubs, or gallops.  Respiratory: Clear to auscultation bilaterally. GI: Soft, nontender, non-distended  MS: No edema Neuro:  Nonfocal  Psych: Normal affect   Labs    Chemistry Recent Labs  Lab 02/25/19 1122 02/25/19 1129 02/25/19 1130 02/26/19 0335  NA 139  --  140 140  K 4.9  --  4.6 4.2  CL 103  --   --  105  CO2 26  --   --  24  GLUCOSE 101*   --   --  98  BUN 27*  --   --  28*  CREATININE 1.10* 1.00  --  1.27*  CALCIUM 10.4*  --   --  10.0  PROT 7.5  --   --   --   ALBUMIN 4.4  --   --   --   AST 52*  --   --   --   ALT 29  --   --   --   ALKPHOS 78  --   --   --   BILITOT 1.0  --   --   --   GFRNONAA 48*  --   --  40*  GFRAA 55*  --   --  46*  ANIONGAP 10  --   --  11     Hematology Recent Labs  Lab 02/25/19 1122 02/25/19 1130 02/26/19 0335  WBC 6.6  --  6.4  RBC 4.83  --  4.18  HGB 14.1 13.6 12.2  HCT 43.0 40.0 37.0  MCV 89.0  --  88.5  MCH 29.2  --  29.2  MCHC 32.8  --  33.0  RDW 12.8  --  12.9  PLT 197  --  175    Cardiac Enzymes Recent Labs  Lab 02/25/19 1122 02/25/19 1613 02/25/19 2040 02/26/19 0335  TROPONINI 1.48* 1.76* 1.20* 1.36*     BNP Recent Labs  Lab 02/25/19 1122  BNP 703.5*     Radiology    Dg Chest Port 1 View  Result Date: 02/25/2019 CLINICAL DATA:  Chest pain. EXAM: PORTABLE CHEST 1 VIEW COMPARISON:  Radiographs of November 16, 2017. FINDINGS: Stable cardiomegaly. No pneumothorax or pleural effusion is noted. Right lung is clear. Mild left basilar atelectasis or infiltrate is noted. Bony thorax is unremarkable. IMPRESSION: Mild left basilar atelectasis or infiltrate is noted. Electronically Signed   By: Marijo Conception M.D.   On: 02/25/2019 11:47    Cardiac Studies   Echo 11/17/2017 LV EF: 70% -   75% Study Conclusions  - Left ventricle: The cavity size was normal. Systolic function was   vigorous. The estimated ejection fraction was in the range of 70%   to 75%. There was dynamic obstruction at restin the outflow   tract, with a peak velocity of 289 cm/sec and a peak gradient of   33 mm Hg. Wall motion was normal; there were no regional wall   motion abnormalities. Features are consistent with a pseudonormal   left ventricular filling pattern, with concomitant abnormal   relaxation and increased filling pressure (grade 2 diastolic   dysfunction). - Aortic valve:  Valve area (VTI): 2.07 cm^2. Valve area (Vmax):   2.08 cm^2. Valve area (Vmean): 2.3 cm^2. - Mitral valve: There was mild to moderate regurgitation directed   centrally. - Left atrium: The atrium was moderately to severely dilated. - Right atrium: The atrium was moderately dilated. - Pulmonary arteries: PA peak pressure: 53 mm Hg (S).  Impressions:  - Although the morphology is not consistent with hypertrophic   cardiomyopathy, there is a mildly increased subvalvular outflow   tract gradient due to hyperdynamic LV contraction and increased   stroke volume.  Patient Profile     78 y.o. female with PMH of CAD s/p PCI to LAD 09/1999, atrial fibrillation followed by Dr. Rayann Heman, HTN, HLD , chronic lower extremity edema who presented with chest pain and ruled in for NSTEMI  Assessment & Plan    1. NSTEMI  - eliquis held, plan for cardiac catheterization later this morning  2. PAF  - continue Tikosyn, holding Eliquis in anticipation of cath  3. HTN: lasix and KCl will be resumed after cath  4. HLD: Crestor increased to 40mg  daily.   5. Bradycardia: known history of bradycardia, HR 40-50s, no AV nodal blocking agent  6. OSA on CPAP  For questions or updates, please contact [place] Please consult [email or web address] for contact info under        Signed, Almyra Deforest, [place]  02/26/2019, 8:12 AM   As above, patient seen and examined.  She denies chest pain this morning.  Apixaban is on hold.  Plan to proceed with cardiac catheterization as outlined in H&P yesterday. Continue aspirin, heparin, NT paste and statin.  She is not on a beta-blocker because of bradycardia.  Lasix and potassium on hold prior to catheterization.  Resume once renal function stable.  Resume apixaban once all procedures complete.  Kirk Ruths, MD

## 2019-02-26 NOTE — Interval H&P Note (Signed)
Cath Lab Visit (complete for each Cath Lab visit)  Clinical Evaluation Leading to the Procedure:   ACS: Yes.    Non-ACS:    Anginal Classification: CCS IV  Anti-ischemic medical therapy: Maximal Therapy (2 or more classes of medications)  Non-Invasive Test Results: No non-invasive testing performed  Prior CABG: No previous CABG      History and Physical Interval Note:  02/26/2019 1:37 PM  Barbara [name]  has presented today for surgery, with the diagnosis of non stemi.  The various methods of treatment have been discussed with the patient and family. After consideration of risks, benefits and other options for treatment, the patient has consented to  Procedure(s): LEFT HEART CATH AND CORONARY ANGIOGRAPHY (N/A) as a surgical intervention.  The patient's history has been reviewed, patient examined, no change in status, stable for surgery.  I have reviewed the patient's chart and labs.  Questions were answered to the patient's satisfaction.     Shelva Majestic

## 2019-02-26 NOTE — H&P (View-Only) (Signed)
Progress Note  Patient Name: Barbara [name] Date of Encounter: 02/26/2019  Primary Cardiologist: Shelva Majestic, MD / Dr. Rayann Heman  Subjective   No CP or dyspnea this AM  Inpatient Medications    Scheduled Meds: . [START ON 02/27/2019] aspirin EC  81 mg Oral Daily  . dofetilide  125 mcg Oral BID  . irbesartan  75 mg Oral Daily  . nitroGLYCERIN  0.5 inch Topical Q6H  . rosuvastatin  40 mg Oral Daily  . sodium chloride flush  3 mL Intravenous Q12H   Continuous Infusions: . sodium chloride    . sodium chloride 1 mL/kg/hr (02/26/19 0549)  . heparin 850 Units/hr (02/26/19 0548)   PRN Meds: sodium chloride, acetaminophen, nitroGLYCERIN, sodium chloride flush   Vital Signs    Vitals:   02/25/19 1640 02/25/19 1748 02/25/19 1935 02/26/19 0352  BP: (!) 176/57 (!) 155/45 (!) 125/43 124/60  Pulse: (!) 49 (!) 50 (!) 51 (!) 55  Resp: 17 18 18 18   Temp: 97.7 F (36.5 C)  97.9 F (36.6 C) 98.7 F (37.1 C)  TempSrc: Oral  Oral Oral  SpO2: 100% 100% 97% 96%  Weight:  70.5 kg  71.4 kg  Height:  5\' 8"  (1.727 m)      Intake/Output Summary (Last 24 hours) at 02/26/2019 8938 Last data filed at 02/26/2019 0559 Gross per 24 hour  Intake 556 ml  Output 400 ml  Net 156 ml   Last 3 Weights 02/26/2019 02/25/2019 02/25/2019  Weight (lbs) 157 lb 6.4 oz 155 lb 6 oz 157 lb  Weight (kg) 71.396 kg 70.478 kg 71.215 kg      Telemetry    Sinus bradycardia - Personally Reviewed  ECG    NSR with TWI in inferolateral leads - Personally Reviewed  Physical Exam   GEN: No acute distress.   Neck: No JVD Cardiac: RRR, no murmurs, rubs, or gallops.  Respiratory: Clear to auscultation bilaterally. GI: Soft, nontender, non-distended  MS: No edema Neuro:  Nonfocal  Psych: Normal affect   Labs    Chemistry Recent Labs  Lab 02/25/19 1122 02/25/19 1129 02/25/19 1130 02/26/19 0335  NA 139  --  140 140  K 4.9  --  4.6 4.2  CL 103  --   --  105  CO2 26  --   --  24  GLUCOSE 101*   --   --  98  BUN 27*  --   --  28*  CREATININE 1.10* 1.00  --  1.27*  CALCIUM 10.4*  --   --  10.0  PROT 7.5  --   --   --   ALBUMIN 4.4  --   --   --   AST 52*  --   --   --   ALT 29  --   --   --   ALKPHOS 78  --   --   --   BILITOT 1.0  --   --   --   GFRNONAA 48*  --   --  40*  GFRAA 55*  --   --  46*  ANIONGAP 10  --   --  11     Hematology Recent Labs  Lab 02/25/19 1122 02/25/19 1130 02/26/19 0335  WBC 6.6  --  6.4  RBC 4.83  --  4.18  HGB 14.1 13.6 12.2  HCT 43.0 40.0 37.0  MCV 89.0  --  88.5  MCH 29.2  --  29.2  MCHC 32.8  --  33.0  RDW 12.8  --  12.9  PLT 197  --  175    Cardiac Enzymes Recent Labs  Lab 02/25/19 1122 02/25/19 1613 02/25/19 2040 02/26/19 0335  TROPONINI 1.48* 1.76* 1.20* 1.36*     BNP Recent Labs  Lab 02/25/19 1122  BNP 703.5*     Radiology    Dg Chest Port 1 View  Result Date: 02/25/2019 CLINICAL DATA:  Chest pain. EXAM: PORTABLE CHEST 1 VIEW COMPARISON:  Radiographs of November 16, 2017. FINDINGS: Stable cardiomegaly. No pneumothorax or pleural effusion is noted. Right lung is clear. Mild left basilar atelectasis or infiltrate is noted. Bony thorax is unremarkable. IMPRESSION: Mild left basilar atelectasis or infiltrate is noted. Electronically Signed   By: Marijo Conception M.D.   On: 02/25/2019 11:47    Cardiac Studies   Echo 11/17/2017 LV EF: 70% -   75% Study Conclusions  - Left ventricle: The cavity size was normal. Systolic function was   vigorous. The estimated ejection fraction was in the range of 70%   to 75%. There was dynamic obstruction at restin the outflow   tract, with a peak velocity of 289 cm/sec and a peak gradient of   33 mm Hg. Wall motion was normal; there were no regional wall   motion abnormalities. Features are consistent with a pseudonormal   left ventricular filling pattern, with concomitant abnormal   relaxation and increased filling pressure (grade 2 diastolic   dysfunction). - Aortic valve:  Valve area (VTI): 2.07 cm^2. Valve area (Vmax):   2.08 cm^2. Valve area (Vmean): 2.3 cm^2. - Mitral valve: There was mild to moderate regurgitation directed   centrally. - Left atrium: The atrium was moderately to severely dilated. - Right atrium: The atrium was moderately dilated. - Pulmonary arteries: PA peak pressure: 53 mm Hg (S).  Impressions:  - Although the morphology is not consistent with hypertrophic   cardiomyopathy, there is a mildly increased subvalvular outflow   tract gradient due to hyperdynamic LV contraction and increased   stroke volume.  Patient Profile     78 y.o. female with PMH of CAD s/p PCI to LAD 09/1999, atrial fibrillation followed by Dr. Rayann Heman, HTN, HLD , chronic lower extremity edema who presented with chest pain and ruled in for NSTEMI  Assessment & Plan    1. NSTEMI  - eliquis held, plan for cardiac catheterization later this morning  2. PAF  - continue Tikosyn, holding Eliquis in anticipation of cath  3. HTN: lasix and KCl will be resumed after cath  4. HLD: Crestor increased to 40mg  daily.   5. Bradycardia: known history of bradycardia, HR 40-50s, no AV nodal blocking agent  6. OSA on CPAP  For questions or updates, please contact [place] Please consult [email or web address] for contact info under        Signed, Almyra Deforest, [place]  02/26/2019, 8:12 AM   As above, patient seen and examined.  She denies chest pain this morning.  Apixaban is on hold.  Plan to proceed with cardiac catheterization as outlined in H&P yesterday. Continue aspirin, heparin, NT paste and statin.  She is not on a beta-blocker because of bradycardia.  Lasix and potassium on hold prior to catheterization.  Resume once renal function stable.  Resume apixaban once all procedures complete.  Kirk Ruths, MD

## 2019-02-26 NOTE — Progress Notes (Signed)
Received pt post CaCath, sleepy ,AOx4. Right radial site level 0, dressing CDI.will monitor.

## 2019-02-26 NOTE — Progress Notes (Signed)
Pt's HR is bradying down to the 40s. Pt is also requesting for medication for headache and gas. Paged cardiology. Will continue to monitor.

## 2019-02-27 DIAGNOSIS — I10 Essential (primary) hypertension: Secondary | ICD-10-CM

## 2019-02-27 DIAGNOSIS — R0789 Other chest pain: Secondary | ICD-10-CM

## 2019-02-27 DIAGNOSIS — I480 Paroxysmal atrial fibrillation: Secondary | ICD-10-CM

## 2019-02-27 LAB — CBC
HCT: 33.4 % — ABNORMAL LOW (ref 36.0–46.0)
Hemoglobin: 10.8 g/dL — ABNORMAL LOW (ref 12.0–15.0)
MCH: 28.6 pg (ref 26.0–34.0)
MCHC: 32.3 g/dL (ref 30.0–36.0)
MCV: 88.6 fL (ref 80.0–100.0)
Platelets: 157 10*3/uL (ref 150–400)
RBC: 3.77 MIL/uL — ABNORMAL LOW (ref 3.87–5.11)
RDW: 12.6 % (ref 11.5–15.5)
WBC: 5.5 10*3/uL (ref 4.0–10.5)
nRBC: 0 % (ref 0.0–0.2)

## 2019-02-27 LAB — BASIC METABOLIC PANEL
Anion gap: 6 (ref 5–15)
BUN: 23 mg/dL (ref 8–23)
CO2: 25 mmol/L (ref 22–32)
Calcium: 9.4 mg/dL (ref 8.9–10.3)
Chloride: 110 mmol/L (ref 98–111)
Creatinine, Ser: 1.12 mg/dL — ABNORMAL HIGH (ref 0.44–1.00)
GFR calc Af Amer: 54 mL/min — ABNORMAL LOW (ref >60)
GFR calc non Af Amer: 47 mL/min — ABNORMAL LOW (ref >60)
Glucose, Bld: 93 mg/dL (ref 70–99)
Potassium: 4.3 mmol/L (ref 3.5–5.1)
Sodium: 141 mmol/L (ref 135–145)

## 2019-02-27 MED ORDER — FAMOTIDINE 20 MG PO TABS
20.0000 mg | ORAL_TABLET | Freq: Every day | ORAL | Status: DC
  Administered 2019-02-27 – 2019-02-28 (×2): 20 mg via ORAL
  Filled 2019-02-27 (×2): qty 1

## 2019-02-27 MED ORDER — APIXABAN 5 MG PO TABS
5.0000 mg | ORAL_TABLET | Freq: Two times a day (BID) | ORAL | Status: DC
  Administered 2019-02-27 – 2019-02-28 (×3): 5 mg via ORAL
  Filled 2019-02-27 (×3): qty 1

## 2019-02-27 MED ORDER — ALUM & MAG HYDROXIDE-SIMETH 200-200-20 MG/5ML PO SUSP
15.0000 mL | Freq: Once | ORAL | Status: AC
  Administered 2019-02-27: 15 mL via ORAL
  Filled 2019-02-27: qty 30

## 2019-02-27 MED ORDER — SPIRONOLACTONE 25 MG PO TABS
25.0000 mg | ORAL_TABLET | Freq: Every day | ORAL | Status: DC
  Administered 2019-02-27 – 2019-02-28 (×2): 25 mg via ORAL
  Filled 2019-02-27 (×2): qty 1

## 2019-02-27 NOTE — Progress Notes (Addendum)
Progress Note  Patient Name: Barbara [name] Date of Encounter: 02/27/2019  Primary Cardiologist: Shelva Majestic, MD / Dr. Rayann Heman  Subjective   She developed the same sharp retrosternal chest apin as the one she came for on We.   Inpatient Medications    Scheduled Meds: . aspirin EC  81 mg Oral Daily  . dofetilide  125 mcg Oral BID  . irbesartan  75 mg Oral Daily  . nitroGLYCERIN  0.5 inch Topical Q6H  . rosuvastatin  40 mg Oral Daily  . sodium chloride flush  3 mL Intravenous Q12H   Continuous Infusions: . sodium chloride 100 mL/hr at 02/27/19 0147  . sodium chloride     PRN Meds: sodium chloride, acetaminophen, alum & mag hydroxide-simeth, nitroGLYCERIN, ondansetron (ZOFRAN) IV, sodium chloride flush   Vital Signs    Vitals:   02/26/19 1600 02/26/19 2019 02/27/19 0135 02/27/19 0526  BP: (!) 179/59 (!) 131/48 (!) 150/55 (!) 152/58  Pulse: (!) 57 (!) 56 (!) 53 (!) 54  Resp:  16 16 20   Temp:  98.1 F (36.7 C) 98.7 F (37.1 C) 98.8 F (37.1 C)  TempSrc:  Oral Oral Oral  SpO2:  96% 96% 96%  Weight:   73 kg   Height:        Intake/Output Summary (Last 24 hours) at 02/27/2019 1009 Last data filed at 02/27/2019 0900 Gross per 24 hour  Intake 240 ml  Output 1200 ml  Net -960 ml   Last 3 Weights 02/27/2019 02/26/2019 02/25/2019  Weight (lbs) 161 lb 157 lb 6.4 oz 155 lb 6 oz  Weight (kg) 73.029 kg 71.396 kg 70.478 kg      Telemetry    Sinus bradycardia - Personally Reviewed  ECG    NSR with TWI in inferolateral leads - Personally Reviewed  Physical Exam   GEN: No acute distress.   Neck: No JVD Cardiac: RRR, no murmurs, rubs, or gallops.  Respiratory: Clear to auscultation bilaterally. GI: Soft, nontender, non-distended  MS: No edema Neuro:  Nonfocal  Psych: Normal affect   Labs    Chemistry Recent Labs  Lab 02/25/19 1122 02/25/19 1129 02/25/19 1130 02/26/19 0335 02/27/19 0458  NA 139  --  140 140 141  K 4.9  --  4.6 4.2 4.3  CL 103  --    --  105 110  CO2 26  --   --  24 25  GLUCOSE 101*  --   --  98 93  BUN 27*  --   --  28* 23  CREATININE 1.10* 1.00  --  1.27* 1.12*  CALCIUM 10.4*  --   --  10.0 9.4  PROT 7.5  --   --   --   --   ALBUMIN 4.4  --   --   --   --   AST 52*  --   --   --   --   ALT 29  --   --   --   --   ALKPHOS 78  --   --   --   --   BILITOT 1.0  --   --   --   --   GFRNONAA 48*  --   --  40* 47*  GFRAA 55*  --   --  46* 54*  ANIONGAP 10  --   --  11 6     Hematology Recent Labs  Lab 02/25/19 1122 02/25/19 1130 02/26/19 0335 02/27/19 0458  WBC  6.6  --  6.4 5.5  RBC 4.83  --  4.18 3.77*  HGB 14.1 13.6 12.2 10.8*  HCT 43.0 40.0 37.0 33.4*  MCV 89.0  --  88.5 88.6  MCH 29.2  --  29.2 28.6  MCHC 32.8  --  33.0 32.3  RDW 12.8  --  12.9 12.6  PLT 197  --  175 157    Cardiac Enzymes Recent Labs  Lab 02/25/19 1122 02/25/19 1613 02/25/19 2040 02/26/19 0335  TROPONINI 1.48* 1.76* 1.20* 1.36*     BNP Recent Labs  Lab 02/25/19 1122  BNP 703.5*     Radiology    Dg Chest Port 1 View  Result Date: 02/25/2019 CLINICAL DATA:  Chest pain. EXAM: PORTABLE CHEST 1 VIEW COMPARISON:  Radiographs of November 16, 2017. FINDINGS: Stable cardiomegaly. No pneumothorax or pleural effusion is noted. Right lung is clear. Mild left basilar atelectasis or infiltrate is noted. Bony thorax is unremarkable. IMPRESSION: Mild left basilar atelectasis or infiltrate is noted. Electronically Signed   By: Marijo Conception M.D.   On: 02/25/2019 11:47    Cardiac Studies   Echo 11/17/2017 LV EF: 70% -   75% Study Conclusions  - Left ventricle: The cavity size was normal. Systolic function was   vigorous. The estimated ejection fraction was in the range of 70%   to 75%. There was dynamic obstruction at restin the outflow   tract, with a peak velocity of 289 cm/sec and a peak gradient of   33 mm Hg. Wall motion was normal; there were no regional wall   motion abnormalities. Features are consistent with a  pseudonormal   left ventricular filling pattern, with concomitant abnormal   relaxation and increased filling pressure (grade 2 diastolic   dysfunction). - Aortic valve: Valve area (VTI): 2.07 cm^2. Valve area (Vmax):   2.08 cm^2. Valve area (Vmean): 2.3 cm^2. - Mitral valve: There was mild to moderate regurgitation directed   centrally. - Left atrium: The atrium was moderately to severely dilated. - Right atrium: The atrium was moderately dilated. - Pulmonary arteries: PA peak pressure: 53 mm Hg (S).  Impressions:  - Although the morphology is not consistent with hypertrophic   cardiomyopathy, there is a mildly increased subvalvular outflow   tract gradient due to hyperdynamic LV contraction and increased   stroke volume.  Patient Profile     78 y.o. female with PMH of CAD s/p PCI to LAD 09/1999, atrial fibrillation followed by Dr. Rayann Heman, HTN, HLD , chronic lower extremity edema who presented with chest pain and ruled in for NSTEMI  Assessment & Plan    1. NSTEMI troponin 1.48->1.76->1.36  - cath yesterday showed :  Prox RCA to Mid RCA lesion is 15% stenosed.  Ost LAD to Prox LAD lesion is 20% stenosed.  Prox Cx lesion is 20% stenosed.   Mild non-obstructive coronary artery disease with a patent proximal LAD stent with mild 20% intimal hyperplasia (inserted  09/1999); 20% proximal left circumflex narrowing, and mild irregularity of 10 to 15% in the mid RCA.  Hyperdynamic LV function with an "Ace of Spade "configuration and near cavity obliteration in the mid to apical segment during systole.  There is evidence for left ventricular hypertrophy.  LVEDP is 16 mm.  - restart eliquis today - she is having ongoing sharp chest pain - I will try a GI coctail  2. PAF  - continue Tikosyn, restart Eliquis, remains in SR  3. HTN: l will add spironolactone 25  mg po daily instead of lasix/KCl  4. HLD: Crestor increased to 40mg  daily.   5. Bradycardia: known history of  bradycardia, HR 40-50s, no AV nodal blocking agent  6. OSA on CPAP  For questions or updates, please contact [place] Please consult [email or web address] for contact info under     Signed, Ena Dawley, MD  02/27/2019, 10:09 AM

## 2019-02-28 ENCOUNTER — Telehealth: Payer: Self-pay | Admitting: Cardiology

## 2019-02-28 DIAGNOSIS — I480 Paroxysmal atrial fibrillation: Secondary | ICD-10-CM

## 2019-02-28 LAB — CBC
HCT: 35.9 % — ABNORMAL LOW (ref 36.0–46.0)
Hemoglobin: 11.8 g/dL — ABNORMAL LOW (ref 12.0–15.0)
MCH: 28.7 pg (ref 26.0–34.0)
MCHC: 32.9 g/dL (ref 30.0–36.0)
MCV: 87.3 fL (ref 80.0–100.0)
Platelets: 160 10*3/uL (ref 150–400)
RBC: 4.11 MIL/uL (ref 3.87–5.11)
RDW: 12.7 % (ref 11.5–15.5)
WBC: 9.6 10*3/uL (ref 4.0–10.5)
nRBC: 0 % (ref 0.0–0.2)

## 2019-02-28 MED ORDER — ROSUVASTATIN CALCIUM 40 MG PO TABS: 40.0000 mg | ORAL_TABLET | Freq: Every day | ORAL | 2 refills | Status: DC

## 2019-02-28 MED ORDER — IRBESARTAN 300 MG PO TABS: 150.0000 mg | ORAL_TABLET | Freq: Every day | ORAL | Status: DC

## 2019-02-28 MED ORDER — SPIRONOLACTONE 25 MG PO TABS: 25.0000 mg | ORAL_TABLET | Freq: Every day | ORAL | 2 refills | Status: DC

## 2019-02-28 MED ORDER — CANDESARTAN CILEXETIL 16 MG PO TABS: 16.0000 mg | ORAL_TABLET | Freq: Every day | ORAL | 1 refills | Status: DC

## 2019-02-28 NOTE — Telephone Encounter (Signed)
   TELEPHONE CALL NOTE  This patient has been deemed a candidate for follow-up tele-health visit to limit community exposure during the Covid-19 pandemic. I spoke with the patient via phone to discuss instructions. This has been outlined on the patient's AVS (dotphrase: [email or web address]). The patient was advised to review the section on consent for treatment as well. The patient will receive a phone call 2-3 days prior to their E-Visit at which time consent will be verbally confirmed. A Virtual Office Visit appointment type has been scheduled for TO BE DETERMINED as patient discharged over the weekend with "VIDEO" or "TELEPHONE" in the appointment notes - patient prefers VIDEO type. Has Andriod  I have either confirmed the patient is active in MyChart or offered to send sign-up link to phone/email via Mychart icon beside patient's photo.  Reino Bellis, NP 02/28/2019 11:07 AM

## 2019-02-28 NOTE — Discharge Instructions (Signed)
YOUR CARDIOLOGY TEAM HAS ARRANGED FOR AN E-VISIT FOR YOUR APPOINTMENT - PLEASE REVIEW IMPORTANT INFORMATION BELOW SEVERAL DAYS PRIOR TO YOUR APPOINTMENT  Due to the recent COVID-19 pandemic, we are transitioning in-person office visits to tele-medicine visits in an effort to decrease unnecessary exposure to our patients, their families, and staff. These visits are billed to your insurance just like a normal visit is. We also encourage you to sign up for MyChart if you have not already done so. You will need a smartphone if possible. For patients that do not have this, we can still complete the visit using a regular telephone but do prefer a smartphone to enable video when possible. You may have a family member that lives with you that can help. If possible, we also ask that you have a blood pressure cuff and scale at home to measure your blood pressure, heart rate and weight prior to your scheduled appointment. Patients with clinical needs that need an in-person evaluation and testing will still be able to come to the office if absolutely necessary. If you have any questions, feel free to call our office.     YOUR PROVIDER WILL BE USING THE FOLLOWING PLATFORM TO COMPLETE YOUR VISIT: [email or web address]  . IF USING MYCHART - How to Download the MyChart App to Your SmartPhone   - If Apple, go to App Store and type in MyChart in the search bar and download the app. If Android, ask patient to go to Google Play Store and type in MyChart in the search bar and download the app. The app is free but as with any other app downloads, your phone may require you to verify saved payment information or Apple/Android password.  - You will need to then log into the app with your MyChart username and password, and select [facility] as your healthcare provider to link the account.  - When it is time for your visit, go to the MyChart app, find appointments, and click Begin Video Visit. Be sure to Select Allow for your device to  access the Microphone and Camera for your visit. You will then be connected, and your provider will be with you shortly.  **If you have any issues connecting or need assistance, please contact MyChart service desk [phone] ([phone])**  **If using a computer, in order to ensure the best quality for your visit, you will need to use either of the following Internet Browsers: Google Chrome or Microsoft Edge**  . IF USING DOXIMITY or [email or web address].ME - The staff will give you instructions on receiving your link to join the meeting the day of your visit.      2-3 DAYS BEFORE YOUR APPOINTMENT  You will receive a telephone call from one of our [facility] team members - your caller ID may say "Unknown caller." If this is a video visit, we will walk you through how to get the video launched on your phone. We will remind you check your blood pressure, heart rate and weight prior to your scheduled appointment. If you have an Apple Watch or Kardia, please upload any pertinent ECG strips the day before or morning of your appointment to MyChart. Our staff will also make sure you have reviewed the consent and agree to move forward with your scheduled tele-health visit.     THE DAY OF YOUR APPOINTMENT  Approximately 15 minutes prior to your scheduled appointment, you will receive a telephone call from one of [facility] team - your caller ID may say "Unknown caller."    Our staff will confirm medications, vital signs for the day and any symptoms you may be experiencing. Please have this information available prior to the time of visit start. It may also be helpful for you to have a pad of paper and pen handy for any instructions given during your visit. They will also walk you through joining the smartphone meeting if this is a video visit.    CONSENT FOR TELE-HEALTH VISIT - PLEASE REVIEW  I hereby voluntarily request, consent and authorize [facility] and its employed or contracted physicians, physician  assistants, nurse practitioners or other licensed health care professionals (the Practitioner), to provide me with telemedicine health care services (the "Services") as deemed necessary by the treating Practitioner. I acknowledge and consent to receive the Services by the Practitioner via telemedicine. I understand that the telemedicine visit will involve communicating with the Practitioner through live audiovisual communication technology and the disclosure of certain medical information by electronic transmission. I acknowledge that I have been given the opportunity to request an in-person assessment or other available alternative prior to the telemedicine visit and am voluntarily participating in the telemedicine visit.  I understand that I have the right to withhold or withdraw my consent to the use of telemedicine in the course of my care at any time, without affecting my right to future care or treatment, and that the Practitioner or I may terminate the telemedicine visit at any time. I understand that I have the right to inspect all information obtained and/or recorded in the course of the telemedicine visit and may receive copies of available information for a reasonable fee.  I understand that some of the potential risks of receiving the Services via telemedicine include:  . Delay or interruption in medical evaluation due to technological equipment failure or disruption; . Information transmitted may not be sufficient (e.g. poor resolution of images) to allow for appropriate medical decision making by the Practitioner; and/or  . In rare instances, security protocols could fail, causing a breach of personal health information.  Furthermore, I acknowledge that it is my responsibility to provide information about my medical history, conditions and care that is complete and accurate to the best of my ability. I acknowledge that Practitioner's advice, recommendations, and/or decision may be based on  factors not within their control, such as incomplete or inaccurate data provided by me or distortions of diagnostic images or specimens that may result from electronic transmissions. I understand that the practice of medicine is not an exact science and that Practitioner makes no warranties or guarantees regarding treatment outcomes. I acknowledge that I will receive a copy of this consent concurrently upon execution via email to the email address I last provided but may also request a printed copy by calling the office of [facility].    I understand that my insurance will be billed for this visit.   I have read or had this consent read to me. . I understand the contents of this consent, which adequately explains the benefits and risks of the Services being provided via telemedicine.  . I have been provided ample opportunity to ask questions regarding this consent and the Services and have had my questions answered to my satisfaction. . I give my informed consent for the services to be provided through the use of telemedicine in my medical care  By participating in this telemedicine visit I agree to the above.  

## 2019-02-28 NOTE — Progress Notes (Signed)
Patient refuse CPAP for the night 

## 2019-02-28 NOTE — Progress Notes (Signed)
While given patient the d/c instruction patient c/o of nausea and chest pain patient refuse medicine said she will feel better when she get home. MD notified, MD instruct to monitor the patient and reassess. Around 1600 patient reassess v/s stable and patient stated she wants to go home. Iv and tele d/c, the d/c instruction explain and given to the patient, questions answered. Will d/c pt. Home per order

## 2019-02-28 NOTE — Discharge Summary (Addendum)
Discharge Summary    Patient ID: Barbara [name],  MRN: [id number], DOB/AGE: 03-16-1941 78 y.o.  Admit date: 02/25/2019 Discharge date: 02/28/2019  Primary Care Provider: Alysia Penna A Primary Cardiologist: Dr. Claiborne Billings   Discharge Diagnoses    Principal Problem:   NSTEMI (non-ST elevated myocardial infarction) [facility]) Active Problems:   Hyperlipidemia LDL goal <70   Bradycardia   PAF (paroxysmal atrial fibrillation) (HCC)   Allergies Allergies  Allergen Reactions  . Lidocaine Anaphylaxis  . Morphine Other (See Comments)    "Body shuts down"  . Procaine Hcl Anaphylaxis, Rash and Other (See Comments)    "Anything with 'caine' in it "  . Sulfonamide Derivatives Hives  . Vytorin [Ezetimibe-Simvastatin] Other (See Comments)    Unknown  . Tape Itching    Diagnostic Studies/Procedures    Cath: 02/26/19   Prox RCA to Mid RCA lesion is 15% stenosed.  Ost LAD to Prox LAD lesion is 20% stenosed.  Prox Cx lesion is 20% stenosed.   Mild non-obstructive coronary artery disease with a patent proximal LAD stent with mild 20% intimal hyperplasia (inserted  09/1999); 20% proximal left circumflex narrowing, and mild irregularity of 10 to 15% in the mid RCA.  Hyperdynamic LV function with an "Ace of Spade "configuration and near cavity obliteration in the mid to apical segment during systole.  There is evidence for left ventricular hypertrophy.  LVEDP is 16 mm.  RECOMMENDATION: Medical therapy with optimal blood pressure control, lipid management, and resumption of Eliquis tomorrow. _____________   History of Present Illness     Barbara [name] is a 78 year old female with a history of CAD s/p PCI to high-grade LAD stenosis in 09/1999, atrial fibrillation followed by Dr. Rayann Heman as well as the atrial fibrillation clinic, hypertension, hyperlipidemia and chronic lower extremity edema who presented to [facility] 02/25/2019 with complaints of chest pain found to have non-STEMI.   She  presented with complaints of chest pain which began late in the evening approximately 11:30 PM.  She reported that she began having pressure type pain on the left side of her chest with radiation down her left arm with associated shortness of breath and mild diaphoresis which lasted most of the evening.  When she woke the morning of admission, the pain was less intense however was still present.  Given this she called our office triage line for assistance.  It was recommended that she call EMS for transport to the emergency department.  She stated that she did not have nitroglycerin available for relief.  Per chart review, upon ED arrival her pain had mostly resolved however she had continued reports of persistent shortness of breath and palpitations.   In the ED, EKG revealed SB with T wave inversions throughout as well as ST sloping which appears to be more pronounced than prior tracing on 08/07/2018 but similar.  Troponin found to be markedly elevated at 1.48.  BNP elevated at 703.  Creatinine stable at 1.1.  TSH within normal limits at 1.85.  CXR performed which showed mild left basilar atelectasis or infiltrate with stable cardiomegaly.    Barbara [name] was last seen by our service on 08/24/2018 in follow-up for CAD.  Previously she was seen by Dr. Claiborne Billings 01/13/2018 and denied awareness of recurrent atrial fibrillation.  She was continued on a diuretic and losartan for her hypertension.  She had no complaints of bleeding on Eliquis and she was also continued on Tikosyn for maintenance of normal sinus rhythm in the setting  of PAF. She was last followed in the A. fib clinic by Roderic Palau, NP on 03/17/2018.  At that time she was asymptomatic and tolerating medications well. During her last office visit she continued to have dyspnea on exertion along with daytime somnolence and was referred for an outpatient sleep study and found to have severe OSA.  Last echocardiogram performed 11/17/2017 with LVEF of 70  to 75% with no wall motion abnormalities.  There was evidence of dynamic obstruction at the outflow tract with a peak velocity of 289 cm/s and a peak gradient of 33 mmHg as well as grade 2 diastolic dysfunction.  Per study report, morphology not consistent with hypertrophic cardiomyopathy, there continued to be a mild increase in subvalvular outflow tract gradient due to hyperdynamic LV contraction and increased stroke-volume.  Barbara [name] had a normal myocardial perfusion study 10/06/2014.  Given symptoms she was admitted and placed on IV heparin with plans for cardiac cath. Eliquis held on admission.   Hospital Course      1. NSTEMI:  troponin 1.48->1.76->1.36. Under cath noted above showing mild nonobstructive disease. Hyperdynamic LV function. She was given a GI cocktail which did seem to improved symptoms leading to possible GI cause.   2. PAF: continue Tikosyn, restarted Eliquis post cath, remained in SR  3. HTN: She was switched to spironolactone 25 mg po daily instead of lasix/KCl. Her home candesartan was increased from 8mg  to 16mg  given her elevated blood pressure at the time of discharge.   4. HLD: Crestor increased to 40mg  daily. Julina Altmann need FLP/LFTs in 6 weeks.   5. Bradycardia: known history of bradycardia, HR 40-50s, no AV nodal blocking agent. Stable.   6. OSA on CPAP  [name] was seen by Dr. Curt Bears and determined stable for discharge home. Follow up in the office has been arranged. Medications are listed below. Message sent to the office for evisit follow up.  _____________  Discharge Vitals Blood pressure (!) 188/62, pulse (!) 59, temperature 99.4 F (37.4 C), temperature source Oral, resp. rate 18, height 5\' 8"  (1.727 m), weight 73.2 kg, SpO2 95 %.  Filed Weights   02/26/19 0352 02/27/19 0135 02/28/19 0400  Weight: 71.4 kg 73 kg 73.2 kg    Labs & Radiologic Studies    CBC Recent Labs    02/25/19 1122  02/27/19 0458 02/28/19 0341  WBC 6.6    < > 5.5 9.6  NEUTROABS 4.5  --   --   --   HGB 14.1   < > 10.8* 11.8*  HCT 43.0   < > 33.4* 35.9*  MCV 89.0   < > 88.6 87.3  PLT 197   < > 157 160   < > = values in this interval not displayed.   Basic Metabolic Panel Recent Labs    02/26/19 0335 02/27/19 0458  NA 140 141  K 4.2 4.3  CL 105 110  CO2 24 25  GLUCOSE 98 93  BUN 28* 23  CREATININE 1.27* 1.12*  CALCIUM 10.0 9.4   Liver Function Tests Recent Labs    02/25/19 1122  AST 52*  ALT 29  ALKPHOS 78  BILITOT 1.0  PROT 7.5  ALBUMIN 4.4   No results for input(s): LIPASE, AMYLASE in the last 72 hours. Cardiac Enzymes Recent Labs    02/25/19 1613 02/25/19 2040 02/26/19 0335  TROPONINI 1.76* 1.20* 1.36*   BNP Invalid input(s): POCBNP D-Dimer No results for input(s): DDIMER in the last 72 hours. Hemoglobin A1C  Recent Labs    02/25/19 1613  HGBA1C 5.6   Fasting Lipid Panel No results for input(s): CHOL, HDL, LDLCALC, TRIG, CHOLHDL, LDLDIRECT in the last 72 hours. Thyroid Function Tests No results for input(s): TSH, T4TOTAL, T3FREE, THYROIDAB in the last 72 hours.  Invalid input(s): FREET3 _____________  Dg Chest Port 1 View  Result Date: 02/25/2019 CLINICAL DATA:  Chest pain. EXAM: PORTABLE CHEST 1 VIEW COMPARISON:  Radiographs of November 16, 2017. FINDINGS: Stable cardiomegaly. No pneumothorax or pleural effusion is noted. Right lung is clear. Mild left basilar atelectasis or infiltrate is noted. Bony thorax is unremarkable. IMPRESSION: Mild left basilar atelectasis or infiltrate is noted. Electronically Signed   By: Marijo Conception M.D.   On: 02/25/2019 11:47   Disposition   Pt is being discharged home today in good condition.  Follow-up Plans & Appointments    Follow-up Information    Troy Sine, MD Follow up.   Specialty:  Cardiology Why:  The office Lilienne Weins call you with an appt. This Dyrell Tuccillo be scheduled as an evisit through your mobile phone.  Contact information: [address] [place] [address] [phone]          Discharge Instructions    Call MD for:  redness, tenderness, or signs of infection (pain, swelling, redness, odor or green/yellow discharge around incision site)   Complete by:  As directed    Diet - low sodium heart healthy   Complete by:  As directed    Discharge instructions   Complete by:  As directed    Radial Site Care Refer to this sheet in the next few weeks. These instructions provide you with information on caring for yourself after your procedure. Your caregiver may also give you more specific instructions. Your treatment has been planned according to current medical practices, but problems sometimes occur. Call your caregiver if you have any problems or questions after your procedure. HOME CARE INSTRUCTIONS You may shower the day after the procedure.Remove the bandage (dressing) and gently wash the site with plain soap and water.Gently pat the site dry.  Do not apply powder or lotion to the site.  Do not submerge the affected site in water for 3 to 5 days.  Inspect the site at least twice daily.  Do not flex or bend the affected arm for 24 hours.  No lifting over 5 pounds (2.3 kg) for 5 days after your procedure.  Do not drive home if you are discharged the same day of the procedure. Have someone else drive you.  You may drive 24 hours after the procedure unless otherwise instructed by your caregiver.  What to expect: Any bruising Revel Stellmach usually fade within 1 to 2 weeks.  Blood that collects in the tissue (hematoma) may be painful to the touch. It should usually decrease in size and tenderness within 1 to 2 weeks.  SEEK IMMEDIATE MEDICAL CARE IF: You have unusual pain at the radial site.  You have redness, warmth, swelling, or pain at the radial site.  You have drainage (other than a small amount of blood on the dressing).  You have chills.  You have a fever or persistent symptoms for more than 72 hours.  You have a fever  and your symptoms suddenly get worse.  Your arm becomes pale, cool, tingly, or numb.  You have heavy bleeding from the site. Hold pressure on the site.   We have stopped your lasix with potassium supplement and switched this to  spirolactone instead. Please make sure you are not taking the potassium supplement.   Increase activity slowly   Complete by:  As directed        Discharge Medications     Medication List    STOP taking these medications   furosemide 40 MG tablet Commonly known as:  LASIX   potassium chloride 10 MEQ CR capsule Commonly known as:  MICRO-K     TAKE these medications   BIOFREEZE EX Apply 1 application topically 3 (three) times daily.   candesartan 16 MG tablet Commonly known as:  ATACAND Take 1 tablet (16 mg total) by mouth daily. What changed:    medication strength  how much to take   desoximetasone 0.25 % cream Commonly known as:  TOPICORT APPLY 1 APPLICATION TOPICALLY 2 TIMES DAILY What changed:  See the new instructions.   dextromethorphan 30 MG/5ML liquid Commonly known as:  DELSYM Take 15 mg by mouth daily as needed for cough.   dofetilide 125 MCG capsule Commonly known as:  TIKOSYN TAKE 1 CAPSULE TWICE A DAY   Eliquis 5 MG Tabs tablet Generic drug:  apixaban TAKE 1 TABLET TWICE A DAY What changed:  how much to take   Fish Oil 1200 MG Caps Take 1,200 mg by mouth daily.   ipratropium 0.06 % nasal spray Commonly known as:  ATROVENT Place 2 sprays into both nostrils daily as needed for rhinitis.   loratadine 10 MG tablet Commonly known as:  CLARITIN Take 10 mg by mouth daily as needed for allergies.   multivitamin with minerals Tabs tablet Take 1 tablet by mouth daily.   naproxen sodium 220 MG tablet Commonly known as:  ALEVE Take 220 mg by mouth at bedtime.   omeprazole 20 MG tablet Commonly known as:  PRILOSEC OTC Take 20 mg by mouth daily as needed (for acid reflux).   rosuvastatin 40 MG tablet Commonly known  as:  CRESTOR Take 1 tablet (40 mg total) by mouth daily. What changed:    medication strength  how much to take   spironolactone 25 MG tablet Commonly known as:  ALDACTONE Take 1 tablet (25 mg total) by mouth daily.   triamcinolone 55 MCG/ACT nasal inhaler Commonly known as:  NASACORT Place 1 spray into both nostrils daily as needed (for allergies).   VITAMIN B-12 PO Place 1 mL under the tongue 2 (two) times a week.   Vitamin D3 50 MCG (2000 UT) Tabs Take 2,000 Units by mouth daily.        Acute coronary syndrome (MI, NSTEMI, STEMI, etc) this admission?:  No.  The elevated Troponin was due to the acute medical illness or demand ischemia.   Outstanding Labs/Studies   FLP/FLTs in 6-8 weeks.   Duration of Discharge Encounter   Greater than 30 minutes including physician time.  Signed, Reino Bellis NP-C 02/28/2019, 11:06 AM  I have seen and examined this patient with Reino Bellis.  Agree with above, note added to reflect my findings.  On exam, RRR, no murmurs, lungs clear. Patient s/p NSTEMI. No obvious CAD seen. Plan for discharge with follow up in clinic.    Asjia Berrios M. Aydian Dimmick MD 03/01/2019 8:24 AM

## 2019-02-28 NOTE — Progress Notes (Signed)
Progress Note  Patient Name: Barbara [name] Date of Encounter: 02/28/2019  Primary Cardiologist: Shelva Majestic, MD / Dr. Rayann Heman  Subjective   Continue to have substernal chest pain.  This was improved with GI cocktail yesterday.  Inpatient Medications    Scheduled Meds: . apixaban  5 mg Oral BID  . aspirin EC  81 mg Oral Daily  . dofetilide  125 mcg Oral BID  . famotidine  20 mg Oral Daily  . irbesartan  75 mg Oral Daily  . nitroGLYCERIN  0.5 inch Topical Q6H  . rosuvastatin  40 mg Oral Daily  . sodium chloride flush  3 mL Intravenous Q12H  . spironolactone  25 mg Oral Daily   Continuous Infusions: . sodium chloride 100 mL/hr at 02/27/19 0147  . sodium chloride     PRN Meds: sodium chloride, acetaminophen, alum & mag hydroxide-simeth, nitroGLYCERIN, ondansetron (ZOFRAN) IV, sodium chloride flush   Vital Signs    Vitals:   02/27/19 1405 02/27/19 1951 02/28/19 0400 02/28/19 0800  BP: (!) 167/55 (!) 175/52 (!) 168/52 (!) 188/62  Pulse: (!) 52 61 60 (!) 59  Resp: 20 16 18    Temp: 97.6 F (36.4 C) 98.6 F (37 C) 99.4 F (37.4 C)   TempSrc: Oral Oral Oral   SpO2: 95% 96% 98% 95%  Weight:   73.2 kg   Height:        Intake/Output Summary (Last 24 hours) at 02/28/2019 1014 Last data filed at 02/28/2019 0400 Gross per 24 hour  Intake 840 ml  Output 900 ml  Net -60 ml   Last 3 Weights 02/28/2019 02/27/2019 02/26/2019  Weight (lbs) 161 lb 4.8 oz 161 lb 157 lb 6.4 oz  Weight (kg) 73.165 kg 73.029 kg 71.396 kg      Telemetry    Sinus rhythm- Personally Reviewed  ECG    None new- Personally Reviewed  Physical Exam   GEN: Well nourished, well developed, in no acute distress  HEENT: normal  Neck: no JVD, carotid bruits, or masses Cardiac: RRR; no murmurs, rubs, or gallops,no edema  Respiratory:  clear to auscultation bilaterally, normal work of breathing GI: soft, nontender, nondistended, + BS MS: no deformity or atrophy  Skin: warm and dry Neuro:   Strength and sensation are intact Psych: euthymic mood, full affect   Labs    Chemistry Recent Labs  Lab 02/25/19 1122 02/25/19 1129 02/25/19 1130 02/26/19 0335 02/27/19 0458  NA 139  --  140 140 141  K 4.9  --  4.6 4.2 4.3  CL 103  --   --  105 110  CO2 26  --   --  24 25  GLUCOSE 101*  --   --  98 93  BUN 27*  --   --  28* 23  CREATININE 1.10* 1.00  --  1.27* 1.12*  CALCIUM 10.4*  --   --  10.0 9.4  PROT 7.5  --   --   --   --   ALBUMIN 4.4  --   --   --   --   AST 52*  --   --   --   --   ALT 29  --   --   --   --   ALKPHOS 78  --   --   --   --   BILITOT 1.0  --   --   --   --   GFRNONAA 48*  --   --  40* 47*  GFRAA 55*  --   --  46* 54*  ANIONGAP 10  --   --  11 6     Hematology Recent Labs  Lab 02/26/19 0335 02/27/19 0458 02/28/19 0341  WBC 6.4 5.5 9.6  RBC 4.18 3.77* 4.11  HGB 12.2 10.8* 11.8*  HCT 37.0 33.4* 35.9*  MCV 88.5 88.6 87.3  MCH 29.2 28.6 28.7  MCHC 33.0 32.3 32.9  RDW 12.9 12.6 12.7  PLT 175 157 160    Cardiac Enzymes Recent Labs  Lab 02/25/19 1122 02/25/19 1613 02/25/19 2040 02/26/19 0335  TROPONINI 1.48* 1.76* 1.20* 1.36*     BNP Recent Labs  Lab 02/25/19 1122  BNP 703.5*     Radiology    No results found.  Cardiac Studies   Echo 11/17/2017 LV EF: 70% -   75% Study Conclusions  - Left ventricle: The cavity size was normal. Systolic function was   vigorous. The estimated ejection fraction was in the range of 70%   to 75%. There was dynamic obstruction at restin the outflow   tract, with a peak velocity of 289 cm/sec and a peak gradient of   33 mm Hg. Wall motion was normal; there were no regional wall   motion abnormalities. Features are consistent with a pseudonormal   left ventricular filling pattern, with concomitant abnormal   relaxation and increased filling pressure (grade 2 diastolic   dysfunction). - Aortic valve: Valve area (VTI): 2.07 cm^2. Valve area (Vmax):   2.08 cm^2. Valve area (Vmean): 2.3 cm^2.  - Mitral valve: There was mild to moderate regurgitation directed   centrally. - Left atrium: The atrium was moderately to severely dilated. - Right atrium: The atrium was moderately dilated. - Pulmonary arteries: PA peak pressure: 53 mm Hg (S).  Impressions:  - Although the morphology is not consistent with hypertrophic   cardiomyopathy, there is a mildly increased subvalvular outflow   tract gradient due to hyperdynamic LV contraction and increased   stroke volume.  Patient Profile     78 y.o. female with PMH of CAD s/p PCI to LAD 09/1999, atrial fibrillation followed by Dr. Rayann Heman, HTN, HLD , chronic lower extremity edema who presented with chest pain and ruled in for NSTEMI  Assessment & Plan    1. NSTEMI troponin 1.48->1.76->1.36 Heart catheterization with mild nonobstructive coronary artery disease.  She has hyperdynamic LV function.  Her Eliquis was restarted.  Current chest pain appears to be GI in nature.   2. PAF: Continue dofetilide, Eliquis    3. HTN: Elevated today.  Barbara [name] increase Avapro to 150 mg.  4. HLD: Continue Crestor  5. Bradycardia: Known history of bradycardia.  On no AV nodal blockers.  6. OSA: Continue CPAP  And for discharge today.  For questions or updates, please contact [facility] Please consult [email or web address] for contact info under     Signed, Barbara Chagnon Meredith Leeds, MD  02/28/2019, 10:14 AM

## 2019-03-01 ENCOUNTER — Encounter (HOSPITAL_COMMUNITY): Payer: Self-pay | Admitting: Cardiovascular Disease

## 2019-03-02 ENCOUNTER — Telehealth: Payer: Self-pay | Admitting: Cardiovascular Disease

## 2019-03-02 ENCOUNTER — Telehealth: Payer: Self-pay | Admitting: *Deleted

## 2019-03-02 NOTE — Telephone Encounter (Signed)
Copied from [place] [id number]. Topic: General - Call Back - No Documentation >> Mar 01, 2019  5:03 PM Selinda Flavin B, NT wrote: Reason for CRM: Patient states that she is returning a call from someone in the office. States that they spoke with her husband and wanted her to call back. No documentation found in chart.    I do not see in her chart where anyone has called the pt from this office.

## 2019-03-02 NOTE — Telephone Encounter (Signed)
°  Patient wants to know if she needs to be taking Lasix   Pt c/o swelling: STAT is pt has developed SOB within 24 hours  1) How much weight have you gained and in what time span?   2) If swelling, where is the swelling located? feet  3) Are you currently taking a fluid pill?    4) Are you currently SOB? no  5) Do you have a log of your daily weights (if so, list)? 159 today  6) Have you gained 3 pounds in a day or 5 pounds in a week?   7) Have you traveled recently? no

## 2019-03-02 NOTE — Telephone Encounter (Signed)
Per pt has noted some puffiness in feet noted while was in hospital as well Per pt called and was wanting to know why was not taking Furosemide any more Reviewed discharge summary and meds were changed Pt now taking Spironolactone 25 mg and Candesartan was increased to 16 mg Per pt will continue to monitor and if no improvement will call back Pt has appt with Dr Claiborne Billings 03-11-19 Pt agrees Will forward to Dr Claiborne Billings for review .Adonis Housekeeper

## 2019-03-02 NOTE — Telephone Encounter (Signed)
Error

## 2019-03-03 NOTE — Telephone Encounter (Signed)
Patient had heart cath with Dr.Kelly.

## 2019-03-03 NOTE — Telephone Encounter (Signed)
Agree with plan 

## 2019-03-08 ENCOUNTER — Other Ambulatory Visit: Payer: Self-pay | Admitting: Cardiovascular Disease

## 2019-03-08 MED ORDER — APIXABAN 5 MG PO TABS: 5.0000 mg | ORAL_TABLET | Freq: Two times a day (BID) | ORAL | 0 refills | Status: DC

## 2019-03-08 MED ORDER — APIXABAN 5 MG PO TABS: 5.0000 mg | ORAL_TABLET | Freq: Two times a day (BID) | ORAL | 1 refills | Status: DC

## 2019-03-08 NOTE — Telephone Encounter (Signed)
78yo female Scr = 1.12 (02/27/2019) Wt = 73.2kg  OV on 08/24/18 with Arnold Long

## 2019-03-08 NOTE — Telephone Encounter (Signed)
°*  STAT* If patient is at the pharmacy, call can be transferred to refill team.   1. Which medications need to be refilled? (please list name of each medication and dose if known) Eliquis 5 mg   2. Which pharmacy/location (including street and city if local pharmacy) is medication to be sent to? Patient need a few [facility] [address] Then the rest to exspess script  3. Do they need a 30 day or 90 day supply? [place]

## 2019-03-11 ENCOUNTER — Telehealth (INDEPENDENT_AMBULATORY_CARE_PROVIDER_SITE_OTHER): Payer: Medicare Other | Admitting: Cardiovascular Disease

## 2019-03-11 VITALS — BP 127/56 | HR 64 | Ht 68.0 in | Wt 156.2 lb

## 2019-03-11 DIAGNOSIS — I10 Essential (primary) hypertension: Secondary | ICD-10-CM | POA: Diagnosis not present

## 2019-03-11 DIAGNOSIS — G4733 Obstructive sleep apnea (adult) (pediatric): Secondary | ICD-10-CM

## 2019-03-11 DIAGNOSIS — I2510 Atherosclerotic heart disease of native coronary artery without angina pectoris: Secondary | ICD-10-CM | POA: Diagnosis not present

## 2019-03-11 DIAGNOSIS — Z7901 Long term (current) use of anticoagulants: Secondary | ICD-10-CM

## 2019-03-11 DIAGNOSIS — I5032 Chronic diastolic (congestive) heart failure: Secondary | ICD-10-CM

## 2019-03-11 DIAGNOSIS — E785 Hyperlipidemia, unspecified: Secondary | ICD-10-CM

## 2019-03-11 MED ORDER — APIXABAN 5 MG PO TABS: 5.0000 mg | ORAL_TABLET | Freq: Two times a day (BID) | ORAL | 0 refills | Status: DC

## 2019-03-11 NOTE — Progress Notes (Signed)
Virtual Visit via Video Note   This visit type was conducted due to national recommendations for restrictions regarding the COVID-19 Pandemic (e.g. social distancing) in an effort to limit this patient's exposure and mitigate transmission in our community.  Due to her co-morbid illnesses, this patient is at least at moderate risk for complications without adequate follow up.  This format is felt to be most appropriate for this patient at this time.  All issues noted in this document were discussed and addressed.  A limited physical exam was performed with this format.  Please refer to the patient's chart for her consent to telehealth for [facility]   Date:  03/11/2019   ID:  [name], DOB 1941-08-20, MRN [id number]  Patient Location: Home Provider Location: Other:  hospital  PCP:  Laurey Morale, MD  Cardiologist:  Shelva Majestic, MD  Electrophysiologist:  None   Evaluation Performed:  Follow-Up Visit  Chief Complaint: Lump evaluation following her recent hospitalization with cardiac catheterization as well as  sleep evaluation in follow-up of initiation of CPAP therapy  History of Present Illness:    Barbara [name] is a 78 y.o. female who has CAD and in November 2000 underwent stenting of a high-grade LAD stenosis with an [id number] 3.518 mm bare-metal stent. A nuclear perfusion study in November 2013 was unchanged from previously and continued to show normal perfusion without scar or ischemia. Ejection fraction was 68%. An echo Doppler study revealed an ejection fraction in the 55-60% range with grade 1 diastolic dysfunction. 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  Additional problems include hypertension as well as hyperlipidemia. Over the past several months she has noticed that she is more tired.  She also has noticed more shortness of breath with activity.  She  has noted some vague indigestion symptoms.  She admits to some mild ankle swelling.  She has been taking Crestor 10 mg and denies myalgias.  She has been on losartan HCT 100/25 as well as amlodipine 10 mg and Lasix 20 mg for blood pressure and peripheral edema.  When I saw her in November.  Her blood pressure was controlled.  She was bradycardic not on any rate control medication and I raise the possibility of a component of chronotropic incompetence.  To further evaluate her exertional dyspnea.  She underwent a nuclear perfusion study on 10/06/2014 which was normal.  Post stress ejection fraction was 67%.  An echo Doppler study done on 10/06/2014 showed an EF of 60-65% with moderate left ventricular hypertrophy.  There was grade 1 diastolic dysfunction.  She had indeterminate LV filling pressure.  There was mild aortic sclerosis without stenosis, mitral annular calcification with mild MR, and mild dilatation of her left atrium with mild tricuspid regurgitation.  Pulmonary pressures were minimally elevated at 31 mm.  She underwent successful left knee surgery by Dr. Lindwood Qua in March 2016.  She tolerated surgery well from a cardiovascular standpoint.  An echo Doppler study in March 2017 showed mild LVH with vigorous LV function with an EF of 65-70%.  There was grade 2 diastolic dysfunction.  There was moderate aortic sclerosis without stenosis.  There was mild LA dilation.  PA pressures were upper normal.  When I  saw her in May 2018, she had noticed issues with ankle swelling.  She had stopped taking furosemide since his had expired.  I elected to institute spironolactone with her  moderate diastolic dysfunction.  She was taking amlodipine 10 mg, losartan HCT 100/25 mg and she has been on spironolactone 25 mg daily.    When I  saw her in August 2018 she was in sinus rhythm and had bilateral ankle and feet swelling.  I reduced her amlodipine to 5 mg and further titrated spironolactone to 25 mg twice  a day.  Recently she has been feeling well.  She went to [place] over Christmas and did well.  However, since 11/05/2017 she noticed her heart rate being a little faster and subsequently felt some irregularity.  I saw her in the office on 11/10/2017 and she was in atrial flutter with variable block.  Her blood pressure was elevated.  I started metoprolol 25 mg twice a day and started eliquis 5 mg twice a day.  An echo Doppler study on November 17, 2017 showed an EF of 70-75% with grade 2 diastolic dysfunction.  There was mild to moderate MR, moderately severe LA enlargement, and PA pressure was increased at 53 mm.  She was hospitalized and underwent Tikosyn loading with Dr. Rayann Heman, which ultimately pharmacologically cardioverted her back to sinus rhythm.  She was seen in follow-up in the atrial fibrillation clinic on 12/01/2017.  At that time, she was maintaining sinus rhythm and her QT interval was stable.    I saw her on December 03, 2017 at which time she was doing well.  Subsequently she has seen Dr. Rayann Heman.  Has had issues with hoarseness and was referred to Dr. Redmond Baseman for ENT evaluation.  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  She is scheduled to undergo a CT of the soft tissue of her neck with contrast on January 20, 2018.  She was recently notified that her valsartan has a contaminant.  She denies any awareness of recurrent atrial fibrillation.  She denies significant swelling.  She has been maintained on furosemide 40 mg daily, losartan 50 mg for hypertension.  She denies bleeding on Eliquis 5 mg twice daily.  She continues to be on rosuvastatin for hyperlipidemia.  She is on Tikosyn 125 mcg twice a day.    Since I last saw her in the office in March 2019, she has continued to do well.  Due to concerns for obstructive sleep apnea she was referred for a sleep study.  This was done in December 2019 revealed severe sleep apnea with an AHI at 37/h, with REM sleep AHI at 52/h in supine sleep  AHI at 47/h.  She had significant oxygen desaturation to a nadir of 77%.  CPAP was instituted and she was titrated up to 11 cm.  She was started on CPAP therapy in 2020 with [facility] [employer] as her DME company.  A most recent download from April 7 through Mar 10, 2019 shows that she is meeting compliance standards with 90% of usage days.  She is averaging 6 hours and 42 minutes of usage per night.  At 10 cm water pressure AHI is excellent at 2.9.  There is no leak.  She now has nasal pillows, but previously it had a full facemask which she did not tolerate.  She developed episodes of chest pain and increasing shortness of breath leading to her hospitalization in April 2020.  Was mild troponin elevation and she had inferolateral T wave abnormalities.  She underwent cardiac catheterization.  The results are as shown below and she was found to have nonobstructive CAD with a widely patent previously placed  LAD stent and mild concomitant CAD.  She had LVH with a "Spade "like ventricle.  Presently she denies any chest pain.  She does admit to some shortness of breath.  She is sleeping better.  However she is not sleeping long enough duration typically only averaging 6 to 7 hours of sleep per night.  At times she still notes fatigue during the day.  She is unaware of any breakthrough snoring.  She presents for evaluation  The patient does not have symptoms concerning for COVID-19 infection (fever, chills, cough, or new shortness of breath).    Past Medical History:  Diagnosis Date   A-fib [facility])    Allergy    CAD (coronary artery disease)    sees Dr. Shelva Majestic  cardiac stents - 2000   CHF (congestive heart failure) ([place])    Colon polyps    Dyspnea    02/12/18 " when my heart gets out of rhythm, it has not been out of rhythym- since I have been on Tikosyn (11/2017)   Dysrhythmia    afib fib   GERD (gastroesophageal reflux disease)    takes OTC- Omeprazole- prn   Heart murmur    History of  stress test    show normal perfusion without scar or ischemia, post EF 68%   Hx of echocardiogram    show an EF 55%-60% range with grade 1 diastolic dysfunction, she had mitral anular calcification with mild MR, moderate LA dilation and mild pulmonary hypertension with a PA estimated pressure of 69mm   Hyperlipidemia    Hypertension    NSTEMI (non-ST elevated myocardial infarction) ([place])    Osteoarthritis    Pneumonia    hx of 2015    Past Surgical History:  Procedure Laterality Date   CARDIAC CATHETERIZATION     11/2017   cardiac stents  2000   COLONOSCOPY  01-05-14   per Dr. Teena Irani, clear, no repeats needed    CORONARY STENT PLACEMENT  2000   in LAD   DIRECT LARYNGOSCOPY WITH RADIAESSE INJECTION N/A 02/13/2018   Procedure: DIRECT LARYNGOSCOPY WITH RADIAESSE INJECTION;  Surgeon: Melida Quitter, MD;  Location: [facility] OR;  Service: ENT;  Laterality: N/A;   KNEE ARTHROSCOPY Left 01/06/2015   Procedure: LEFT KNEE ARTHROSCOPY, abrasion chondroplasty of the medial femerol condryl,medial and lateral menisectomy, microfracture , synovectomy of the suprpatellar pouch;  Surgeon: Latanya Maudlin, MD;  Location: [facility] ORS;  Service: Orthopedics;  Laterality: Left;   LEFT HEART CATH AND CORONARY ANGIOGRAPHY N/A 02/26/2019   Procedure: LEFT HEART CATH AND CORONARY ANGIOGRAPHY;  Surgeon: Troy Sine, MD;  Location: [place] [facility];  Service: Cardiovascular;  Laterality: N/A;   MICROLARYNGOSCOPY W/VOCAL CORD INJECTION N/A 08/07/2018   Procedure: MICROLARYNGOSCOPY WITH VOCAL CORD INJECTION OF PROLARYN;  Surgeon: Melida Quitter, MD;  Location: [place];  Service: ENT;  Laterality: N/A;  JET VENTILATION   VAGINAL HYSTERECTOMY  1971     Current Meds  Medication Sig   apixaban (ELIQUIS) 5 MG TABS tablet Take 1 tablet (5 mg total) by mouth 2 (two) times daily.   candesartan (ATACAND) 16 MG tablet Take 1 tablet (16 mg total) by mouth daily.   Cholecalciferol (VITAMIN D3) 2000 units TABS Take  2,000 Units by mouth daily.   desoximetasone (TOPICORT) 0.25 % cream APPLY 1 APPLICATION TOPICALLY 2 TIMES DAILY (Patient taking differently: Apply 1 application topically 2 (two) times daily. )   dextromethorphan (DELSYM) 30 MG/5ML liquid Take 15 mg by mouth daily as needed  for cough.    dofetilide (TIKOSYN) 125 MCG capsule TAKE 1 CAPSULE TWICE A DAY (Patient taking differently: Take 125 mcg by mouth 2 (two) times daily. )   ipratropium (ATROVENT) 0.06 % nasal spray Place 2 sprays into both nostrils daily as needed for rhinitis.    loratadine (CLARITIN) 10 MG tablet Take 10 mg by mouth daily as needed for allergies.    Menthol, Topical Analgesic, (BIOFREEZE EX) Apply 1 application topically 3 (three) times daily.   Multiple Vitamin (MULTIVITAMIN WITH MINERALS) TABS tablet Take 1 tablet by mouth daily.   naproxen sodium (ALEVE) 220 MG tablet Take 220 mg by mouth at bedtime.   omeprazole (PRILOSEC OTC) 20 MG tablet Take 20 mg by mouth daily as needed (for acid reflux).    rosuvastatin (CRESTOR) 40 MG tablet Take 1 tablet (40 mg total) by mouth daily.   spironolactone (ALDACTONE) 25 MG tablet Take 1 tablet (25 mg total) by mouth daily.   triamcinolone (NASACORT) 55 MCG/ACT nasal inhaler Place 1 spray into both nostrils daily as needed (for allergies).    [DISCONTINUED] apixaban (ELIQUIS) 5 MG TABS tablet Take 1 tablet (5 mg total) by mouth 2 (two) times daily.   [DISCONTINUED] Cyanocobalamin (VITAMIN B-12 PO) Place 1 mL under the tongue 2 (two) times a week.   [DISCONTINUED] Omega-3 Fatty Acids (FISH OIL) 1200 MG CAPS Take 1,200 mg by mouth daily.      Allergies:   Lidocaine; Morphine; Procaine hcl; Sulfonamide derivatives; Vytorin [ezetimibe-simvastatin]; and Tape   Social History   Tobacco Use   Smoking status: Former Smoker    Years: 40.00    Last attempt to quit: 11/04/1996    Years since quitting: 22.3   Smokeless tobacco: Never Used  Substance Use Topics   Alcohol  use: No    Alcohol/week: 0.0 standard drinks   Drug use: No     Family Hx: The patient's family history includes Cancer in her maternal grandmother; Heart disease in her maternal grandfather.  ROS:   Please see the history of present illness.    She denies any fevers chills night sweats. No cough, change in smell or taste Mild fatigue Mild shortness of breath with activity No chest pain. No awareness of recurrent atrial fibrillation or flutter No bleeding on Eliquis No recent GI issues No swelling Tolerating initiation of CPAP therapy, no residual snoring All other systems reviewed and are negative.   Prior CV studies:   The following studies were reviewed today:  Sleep Study IMPRESSIONS: 10/07/2018 - Severe obstructive sleep apnea occurred during the diagnostic portion of the study (AHI 36.9/h; RDI 45.1/h); events were worse with supine position (AHI 46.8/h)  and during REM sleep (AHI 52.2/h).  CPAP was initiated at 5 cm and was titrated to optimal PAP pressure of 11 cm of water. - No significant central sleep apnea occurred during the diagnostic portion of the study (CAI = 0.0/hour). - Severe oxygen desaturation during the diagnostic portion of the study to a nadir of 77%. - The patient snored with loud snoring volume during the diagnostic portion of the study. - No cardiac abnormalities were noted during this study. - Clinically significant periodic limb movements did not occur during sleep.  DIAGNOSIS - Obstructive Sleep Apnea (327.23 [G47.33 ICD-10])  RECOMMENDATIONS - Trial of CPAP therapy with EPR at 11 cm H2O with heated humidification. A Medium size Resmed Full Face Mask AirFit 20 for Her mask was used for the titration.  - Effort should be made to  optimize nasal and oropharyngeal patency. - Avoid alcohol, sedatives and other CNS depressants that may worsen sleep apnea and disrupt normal sleep architecture. - Sleep hygiene should be reviewed to assess factors that  may improve sleep quality. - Weight management and regular exercise should be initiated or continued. - Recommend a download in 30 days and sleep clinic evaluation after 4 weeks of therapy.  [Electronically signed] 10/18/2018 04:25 PM  Cath: 02/26/19   Prox RCA to Mid RCA lesion is 15% stenosed.  Ost LAD to Prox LAD lesion is 20% stenosed.  Prox Cx lesion is 20% stenosed.  Mild non-obstructive coronary artery disease with a patent proximal LAD stent with mild 20% intimal hyperplasia (inserted 09/1999); 20% proximal left circumflex narrowing, and mild irregularity of 10 to 15% in the mid RCA.  Hyperdynamic LV function with an "Ace of Spade "configuration and near cavity obliteration in the mid to apical segment during systole. There is evidence for left ventricular hypertrophy. LVEDP is 16 mm.  RECOMMENDATION: Medical therapy with optimal blood pressure control, lipid management, and resumption of Eliquis tomorrow  Labs/Other Tests and Data Reviewed:    EKG:  An ECG dated 02/26/2019 was personally reviewed today and demonstrated:  Sinus bradycardia with inferior and anterolateral T wave abnormalities  Recent Labs: 03/17/2018: Magnesium 1.8 11/19/2018: TSH 1.85 02/25/2019: ALT 29; B Natriuretic Peptide 703.5 02/27/2019: BUN 23; Creatinine, Ser 1.12; Potassium 4.3; Sodium 141 02/28/2019: Hemoglobin 11.8; Platelets 160   Recent Lipid Panel Lab Results  Component Value Date/Time   CHOL 156 11/19/2018 11:29 AM   TRIG 117.0 11/19/2018 11:29 AM   HDL 57.90 11/19/2018 11:29 AM   CHOLHDL 3 11/19/2018 11:29 AM   LDLCALC 74 11/19/2018 11:29 AM    Wt Readings from Last 3 Encounters:  03/11/19 156 lb 3.2 oz (70.9 kg)  02/28/19 161 lb 4.8 oz (73.2 kg)  11/27/18 157 lb 2 oz (71.3 kg)     Objective:    Vital Signs:  BP (!) 127/56    Pulse 64    Ht 5\' 8"  (1.727 m)    Wt 156 lb 3.2 oz (70.9 kg)    BMI 23.75 kg/m     She is well-developed and well-nourished in no acute  distress HEENT is unremarkable No neck vein distention Breathing  is normal and unlabored Rhythm is stable without irregularity No discomfort to her chest wall to palpation No discomfort to her abdomen with palpation No myalgias No edema Neurologically intact Normal affect, mood and cognition   ASSESSMENT & PLAN:    1. CAD: Patient underwent recent PET cardiac catheterization on February 25, 2021 she presented to the hospital with recurrent chest pain.  CG showed previously noted T wave abnormalities but more pronounced.  BNP was elevated at 703.  Catheterization was reviewed with the patient.  Her LAD stent was patent.  She had mild nonobstructive CAD circumflex and RCA. 2. Exertional dyspnea: Probably contributed by left ventricular hypertrophy with ACEs Spade "configuration and near cavity obliteration in the mid to apical segment of her myocardium during systole.  He has diastolic dysfunction. 3. Dr. sleep apnea: I thoroughly reviewed her diagnostic sleep study and CPAP titration.  She has severe sleep apnea and had significant oxygen desaturation to a nadir of 77%.  I discussed the implications of sleep apnea with reference to increased recurrent AF if left untreated.  She has been on CPAP therapy now for several months and is meeting compliance standards.  On her 10 cm set pressure, AHI  is excellent at 2.9.  Discussed improved sleep duration with optimal sleep at 8 hours per night if at all possible. 4. AF: Maintaining normal sinus rhythm months Tikosyn.  She states her heart rate today is in the 60s.  She denies any heart rate irregularity 5. Eliquis anticoagulation: Tolerating well, without bleeding 6. Essential hypertension: Blood pressure controlled on spironolactone which is also helpful for her diastolic dysfunction 7. Hyperlipidemia with target LDL less than 70: Now on rosuvastatin 40 mg  COVID-19 Education: The signs and symptoms of COVID-19 were discussed with the patient and how  to seek care for testing (follow up with PCP or arrange E-visit).  The importance of social distancing was discussed today.  Time:   Today, I have spent 25 minutes with the patient with telehealth technology discussing the above problems.     Medication Adjustments/Labs and Tests Ordered: Current medicines are reviewed at length with the patient today.  Concerns regarding medicines are outlined above.   Tests Ordered: No orders of the defined types were placed in this encounter.   Medication Changes: No orders of the defined types were placed in this encounter.   Disposition:  Follow up 6 months  Signed, Shelva Majestic, MD  03/11/2019 3:14 PM    [place]

## 2019-03-11 NOTE — Patient Instructions (Signed)
Medication Instructions:  The current medical regimen is effective;  continue present plan and medications.  If you need a refill on your cardiac medications before your next appointment, please call your pharmacy.   Follow-Up: At [facility], you and your health needs are our priority.  As part of our continuing mission to provide you with exceptional heart care, we have created designated Provider Care Teams.  These Care Teams include your primary Cardiologist (physician) and Advanced Practice Providers (APPs -  Physician Assistants and Nurse Practitioners) who all work together to provide you with the care you need, when you need it. You will need a follow up appointment in 6 months.  Please call our office 2 months in advance to schedule this appointment.  You may see Thomas Kelly, MD or one of the following Advanced Practice Providers on your designated Care Team: Hao Meng, PA-C . Angela Duke, PA-C     

## 2019-03-11 NOTE — Addendum Note (Signed)
Addended by: Caprice Beaver T on: 03/11/2019 04:03 PM   Modules accepted: Orders

## 2019-03-18 ENCOUNTER — Ambulatory Visit: Payer: Medicare Other | Admitting: Cardiovascular Disease

## 2019-03-22 ENCOUNTER — Other Ambulatory Visit: Payer: Self-pay | Admitting: Cardiovascular Disease

## 2019-03-22 MED ORDER — ROSUVASTATIN CALCIUM 40 MG PO TABS: 40.0000 mg | ORAL_TABLET | Freq: Every day | ORAL | 3 refills | Status: DC

## 2019-03-22 MED ORDER — SPIRONOLACTONE 25 MG PO TABS: 25.0000 mg | ORAL_TABLET | Freq: Every day | ORAL | 3 refills | Status: DC

## 2019-03-22 MED ORDER — CANDESARTAN CILEXETIL 16 MG PO TABS: 16.0000 mg | ORAL_TABLET | Freq: Every day | ORAL | 3 refills | Status: DC

## 2019-03-22 NOTE — Telephone Encounter (Signed)
°*  STAT* If patient is at the pharmacy, call can be transferred to refill team.   1. Which medications need to be refilled? (please list name of each medication and dose if known) Rosuvastatin 40 mg, Spironolactone 25 mg, Candesartan 16 mg  2. Which pharmacy/location (including street and city if local pharmacy) is medication to be sent to? [facility]   3. Do they need a 30 day or 90 day supply? [place]

## 2019-03-22 NOTE — Telephone Encounter (Signed)
Sent 90 day supple to [facility] at pt's request Per pt has 1 weeks worth will get from local pharmacy to tie pt over until receives mail order .Adonis Housekeeper

## 2019-03-22 NOTE — Telephone Encounter (Signed)
Follow up:    Patient also would like to know if she need to pick the 30 day supply from [facility] until her medication comes in the mail.

## 2019-04-27 ENCOUNTER — Telehealth: Payer: Self-pay | Admitting: Cardiovascular Disease

## 2019-04-27 NOTE — Telephone Encounter (Signed)
Pt c/o swelling: STAT is pt has developed SOB within 24 hours  1) How much weight have you gained and in what time span? This weekend her weight was 160 and usually 155   2) If swelling, where is the swelling located? Ankles and top part of her feet 3) Are you currently taking a fluid pill? No- she wants to know if she can take Lasix every other day     Are you currently SOB? No  4) Do you have a log of your daily weights (if so, list)?no  5) Have you gained 3 pounds in a day or 5 pounds in a week?    6) Have you traveled recently? no

## 2019-04-27 NOTE — Telephone Encounter (Signed)
Spoke with pt. She report for the last 2 week she has noticed when she is up walking around throughout the day, her feet and ankles are swollen but when she wake up in the morning they are back normal. She report some SOB but state it's her norm. Pt state she hasn't noticed a significant increase in weight and last she can remember she was 157 in April and all last week she weighed 160. Pt report when she was in the hospital they stopped her lasix and she is currently taking spironolactone but doesn't urinate as much as she use to with the lasix. Will route to MD for recommendations.

## 2019-05-03 NOTE — Telephone Encounter (Signed)
Try to resume furosemide 20 mg every other day while on the Spironolactone or as needed

## 2019-05-04 ENCOUNTER — Other Ambulatory Visit: Payer: Self-pay | Admitting: Internal Medicine

## 2019-05-04 NOTE — Telephone Encounter (Signed)
Attempted to contact patient to make aware.  Number rang x8 times with no answer, and no voicemail, will try again.

## 2019-05-11 NOTE — Telephone Encounter (Signed)
Called patient- advised of message from MD, patient verbalized understanding.

## 2019-07-21 ENCOUNTER — Telehealth: Payer: Self-pay | Admitting: Family Medicine

## 2019-07-21 NOTE — Telephone Encounter (Signed)
Patient would like to know when she had her flu shot last year, please advise

## 2019-07-21 NOTE — Telephone Encounter (Signed)
Per chart pt received High-Dose flu vaccine 08/03/18.  [facility] may give information to pt/spouse.

## 2019-07-23 NOTE — Telephone Encounter (Signed)
Pt scheduled for flu shot 

## 2019-08-04 ENCOUNTER — Ambulatory Visit (INDEPENDENT_AMBULATORY_CARE_PROVIDER_SITE_OTHER): Payer: Medicare Other | Admitting: *Deleted

## 2019-08-04 ENCOUNTER — Other Ambulatory Visit: Payer: Self-pay

## 2019-08-04 DIAGNOSIS — Z23 Encounter for immunization: Secondary | ICD-10-CM | POA: Diagnosis not present

## 2019-08-04 NOTE — Progress Notes (Signed)
Per orders of Dr. Sarajane Jews, injection of Influenza Vaccine given by Zac[facility] Pontes. Patient tolerated injection well.

## 2019-09-10 ENCOUNTER — Encounter: Payer: Self-pay | Admitting: Family Medicine

## 2019-09-12 ENCOUNTER — Other Ambulatory Visit: Payer: Self-pay | Admitting: Cardiovascular Disease

## 2019-10-12 ENCOUNTER — Telehealth: Payer: Self-pay

## 2019-10-12 NOTE — Telephone Encounter (Signed)
   Primary Cardiologist: Shelva Majestic, MD  Chart reviewed and patient contacted by phone today as part of pre-operative protocol coverage. Given past medical history and time since last visit, based on ACC/AHA guidelines, Barbara [name] would be at acceptable risk for the planned procedure without further cardiovascular testing.   OK to hold Eliquis 2 days pre op per our pharmacy recommendations. I have relayed this to the patient.   I will route this recommendation to the requesting party via Epic fax function and remove from pre-op pool.  Please call with questions.  Kerin Ransom, PA-C 10/12/2019, 4:37 PM

## 2019-10-12 NOTE — Telephone Encounter (Signed)
Pharmacy can you comment on holding Eliquis in this patient (history of AF).  I will then contact the patient for clearance.  Kerin Ransom PA-C 10/12/2019 3:04 PM

## 2019-10-12 NOTE — Telephone Encounter (Signed)
Patient with diagnosis of ATRIAL FIBRILLATION on ELIQUIS for anticoagulation.    Procedure: RIGHT SHOULDER RCR Date of procedure: TBD  CHADS2-VASc score of  6 (CHF, HTN, AGE X2, CAD, female)  CrCl 45ML/MIN   Per office protocol, patient can hold ELIQUIS for 2 days prior to procedure.

## 2019-10-12 NOTE — Telephone Encounter (Signed)
   [facility] Pre-operative Risk Assessment    Request for surgical clearance:  1. What type of surgery is being performed? RIGHT SHOULDER RCR   2. When is this surgery scheduled? TBD   3. What type of clearance is required (medical clearance vs. Pharmacy clearance to hold med vs. Both)? BOTH  4. Are there any medications that need to be held prior to surgery and how long? ELIQUIS   5. Practice name and name of physician performing surgery? [facility] ATTN:KELLY   6. What is your office phone number  [phone]    7.   What is your office fax number  [phone]  8.   Anesthesia type (None, local, MAC, general) ? GENERAL

## 2019-10-19 ENCOUNTER — Telehealth: Payer: Self-pay | Admitting: Cardiovascular Disease

## 2019-10-19 NOTE — Telephone Encounter (Signed)
    1) What problem are you experiencing? Air pressue too high, air blows into eyes.  2) Who is your medical equipment company? [employer]   Please route to the sleep study assistant.

## 2019-10-20 NOTE — Telephone Encounter (Signed)
Patient calling to check on status of yesterday's message.

## 2019-10-21 ENCOUNTER — Encounter (HOSPITAL_BASED_OUTPATIENT_CLINIC_OR_DEPARTMENT_OTHER): Admission: RE | Payer: Self-pay | Source: Home / Self Care

## 2019-10-21 ENCOUNTER — Ambulatory Visit (HOSPITAL_BASED_OUTPATIENT_CLINIC_OR_DEPARTMENT_OTHER): Admission: RE | Admit: 2019-10-21 | Payer: Medicare Other | Source: Home / Self Care | Admitting: Orthopedic Surgery

## 2019-10-21 SURGERY — REPAIR, ROTATOR CUFF, OPEN
Anesthesia: General | Site: Shoulder | Laterality: Right

## 2019-10-22 NOTE — Telephone Encounter (Signed)
Returned a call to patient. She stated that her machine had been "blowing hard". Air has been blowing into her eyes and face. It has since resolved. "I thought ya'll fixed it." I told her no, we had not done anything. Recommended to her that if she has anymore problems she can call us back.

## 2019-10-25 NOTE — Progress Notes (Signed)
Cardiology Office Note:    Date:  10/26/2019   ID:  NIJA WENDLAND, DOB 05-Nov-1940, MRN [id number]  PCP:  Laurey Morale, MD  Cardiologist:  Shelva Majestic, MD  Electrophysiologist:  None   Referring MD: Laurey Morale, MD   Chief Complaint: follow-up of CAD and dyspnea on exertion  History of Present Illness:    Barbara [name] is a 78 y.o. female with a history of CAD s/p BMS to LAD in 2000 with most recent cardiac cath in 02/2019 showing mild non-obstructive CAD with patent proximal LAD stent, CHF, atrial fibrillation on Eliquis, chronic diastolic CHF, hypertension, hyperlipidemia, obstructive sleep apnea on CPAP, GERD followed by Dr. Claiborne Billings.  Patient has remote history of stenting to proximal LAD in 2000. She has had multiple stress tests since that time. Earlier this spring, patient developed episodes of chest pain and increasing shortness of breath leading to her hospitalization in 02/2019. Troponin was mildly elevated at that time and EKG showed inferolateral T wave abnormalities. Patient underwent cardiac catheterization which showed mild non-obstructive CAD with a patent proximal LAD stent. Also noted to have hyperdynamic LV function with an "ACE of Spade" configuration and near cavity obliteration in the mid to apical segment during systole. Continue medical therapy was recommended. Patient was last seen by Dr. Claiborne Billings for a virtual visit in 03/2019 at which time patient continued to have some shortness of breath but denied any chest pain.   Patient presents today for follow-up. Patient has been doing relatively well. Her biggest complaint is fatigue. She reports she gets very tired as well as short of breath just walking from one room to the next in her house. However, she states that this is stable and has not worsened since the last time we saw her. No chest pain. No palpitations. She has notes some lightheadedness when walking from one room to the next but denies any falls,  near syncope, or syncope. No orthopnea or PND with CPAP. No lower extremity edema. No abnormal bleeding on Eliquis. No recent fevers or illnesses.  Of note, patient was scheduled to have right rotator cuff repaired earlier this month. However, patient reportedly cancelled it because she did not want to deal with that right before Christmas. Surgery will likely be rescheduled early next year.   Past Medical History:  Diagnosis Date  . A-fib ([place])   . Allergy   . CAD (coronary artery disease)    sees Dr. Shelva Majestic  cardiac stents - 2000  . CHF (congestive heart failure) ([place])   . Colon polyps   . Dyspnea    02/12/18 " when my heart gets out of rhythm, it has not been out of rhythym- since I have been on Tikosyn (11/2017)  . Dysrhythmia    afib fib  . GERD (gastroesophageal reflux disease)    takes OTC- Omeprazole- prn  . Heart murmur   . History of stress test    show normal perfusion without scar or ischemia, post EF 68%  . Hx of echocardiogram    show an EF 55%-60% range with grade 1 diastolic dysfunction, she had mitral anular calcification with mild MR, moderate LA dilation and mild pulmonary hypertension with a PA estimated pressure of 77mm  . Hyperlipidemia   . Hypertension   . NSTEMI (non-ST elevated myocardial infarction) ([place])   . Osteoarthritis   . Pneumonia    hx of 2015     Past Surgical History:  Procedure Laterality Date  . CARDIAC  CATHETERIZATION     11/2017  . cardiac stents  2000  . COLONOSCOPY  01-05-14   per Dr. Teena Irani, clear, no repeats needed   . CORONARY STENT PLACEMENT  2000   in LAD  . DIRECT LARYNGOSCOPY WITH RADIAESSE INJECTION N/A 02/13/2018   Procedure: DIRECT LARYNGOSCOPY WITH RADIAESSE INJECTION;  Surgeon: Melida Quitter, MD;  Location: [facility];  Service: ENT;  Laterality: N/A;  . KNEE ARTHROSCOPY Left 01/06/2015   Procedure: LEFT KNEE ARTHROSCOPY, abrasion chondroplasty of the medial femerol condryl,medial and lateral menisectomy, microfracture ,  synovectomy of the suprpatellar pouch;  Surgeon: Latanya Maudlin, MD;  Location: [facility];  Service: Orthopedics;  Laterality: Left;  . LEFT HEART CATH AND CORONARY ANGIOGRAPHY N/A 02/26/2019   Procedure: LEFT HEART CATH AND CORONARY ANGIOGRAPHY;  Surgeon: Troy Sine, MD;  Location: [facility];  Service: Cardiovascular;  Laterality: N/A;  . MICROLARYNGOSCOPY W/VOCAL CORD INJECTION N/A 08/07/2018   Procedure: MICROLARYNGOSCOPY WITH VOCAL CORD INJECTION OF PROLARYN;  Surgeon: Melida Quitter, MD;  Location: [facility];  Service: ENT;  Laterality: N/A;  JET VENTILATION  . VAGINAL HYSTERECTOMY  1971    Current Medications: Current Meds  Medication Sig  . candesartan (ATACAND) 16 MG tablet Take 1 tablet (16 mg total) by mouth daily.  . Cholecalciferol (VITAMIN D3) 2000 units TABS Take 2,000 Units by mouth daily.  Marland Kitchen desoximetasone (TOPICORT) 0.25 % cream APPLY 1 APPLICATION TOPICALLY 2 TIMES DAILY (Patient taking differently: Apply 1 application topically 2 (two) times daily. )  . dextromethorphan (DELSYM) 30 MG/5ML liquid Take 15 mg by mouth daily as needed for cough.   . dofetilide (TIKOSYN) 125 MCG capsule Take 1 capsule (125 mcg total) by mouth 2 (two) times daily.  Marland Kitchen ELIQUIS 5 MG TABS tablet TAKE 1 TABLET TWICE A DAY  . ipratropium (ATROVENT) 0.06 % nasal spray Place 2 sprays into both nostrils daily as needed for rhinitis.   Marland Kitchen loratadine (CLARITIN) 10 MG tablet Take 10 mg by mouth daily as needed for allergies.   . Menthol, Topical Analgesic, (BIOFREEZE EX) Apply 1 application topically 3 (three) times daily.  . Multiple Vitamin (MULTIVITAMIN WITH MINERALS) TABS tablet Take 1 tablet by mouth daily.  . naproxen sodium (ALEVE) 220 MG tablet Take 220 mg by mouth at bedtime.  Marland Kitchen omeprazole (PRILOSEC OTC) 20 MG tablet Take 20 mg by mouth daily as needed (for acid reflux).   . rosuvastatin (CRESTOR) 40 MG tablet Take 1 tablet (40 mg total) by mouth daily.  Marland Kitchen spironolactone (ALDACTONE) 25 MG tablet  Take 1 tablet (25 mg total) by mouth daily.  Marland Kitchen triamcinolone (NASACORT) 55 MCG/ACT nasal inhaler Place 1 spray into both nostrils daily as needed (for allergies).      Allergies:   Lidocaine, Morphine, Procaine hcl, Sulfonamide derivatives, Vytorin [ezetimibe-simvastatin], and Tape   Social History   Socioeconomic History  . Marital status: Married    Spouse name: Not on file  . Number of children: 2  . Years of education: Not on file  . Highest education level: Not on file  Occupational History  . Not on file  Tobacco Use  . Smoking status: Former Smoker    Years: 40.00    Quit date: 11/04/1996    Years since quitting: 22.9  . Smokeless tobacco: Never Used  Substance and Sexual Activity  . Alcohol use: No    Alcohol/week: 0.0 standard drinks  . Drug use: No  . Sexual activity: Not on file  Other Topics  Concern  . Not on file  Social History Narrative  . Not on file   Social Determinants of Health   Financial Resource Strain:   . Difficulty of Paying Living Expenses: Not on file  Food Insecurity:   . Worried About Charity fundraiser in the Last Year: Not on file  . Ran Out of Food in the Last Year: Not on file  Transportation Needs:   . Lack of Transportation (Medical): Not on file  . Lack of Transportation (Non-Medical): Not on file  Physical Activity:   . Days of Exercise per Week: Not on file  . Minutes of Exercise per Session: Not on file  Stress:   . Feeling of Stress : Not on file  Social Connections:   . Frequency of Communication with Friends and Family: Not on file  . Frequency of Social Gatherings with Friends and Family: Not on file  . Attends Religious Services: Not on file  . Active Member of Clubs or Organizations: Not on file  . Attends Archivist Meetings: Not on file  . Marital Status: Not on file     Family History: The patient's family history includes Cancer in her maternal grandmother; Heart disease in her maternal  grandfather.  ROS:   Please see the history of present illness.    All other systems reviewed and are negative.  EKGs/Labs/Other Studies Reviewed:    The following studies were reviewed today:  Echocardiogram 11/17/2017: Study Conclusions: - Left ventricle: The cavity size was normal. Systolic function was   vigorous. The estimated ejection fraction was in the range of 70%   to 75%. There was dynamic obstruction at restin the outflow   tract, with a peak velocity of 289 cm/sec and a peak gradient of   33 mm Hg. Wall motion was normal; there were no regional wall   motion abnormalities. Features are consistent with a pseudonormal   left ventricular filling pattern, with concomitant abnormal   relaxation and increased filling pressure (grade 2 diastolic   dysfunction). - Aortic valve: Valve area (VTI): 2.07 cm^2. Valve area (Vmax):   2.08 cm^2. Valve area (Vmean): 2.3 cm^2. - Mitral valve: There was mild to moderate regurgitation directed   centrally. - Left atrium: The atrium was moderately to severely dilated. - Right atrium: The atrium was moderately dilated. - Pulmonary arteries: PA peak pressure: 53 mm Hg (S).  Impressions: - Although the morphology is not consistent with hypertrophic   cardiomyopathy, there is a mildly increased subvalvular outflow   tract gradient due to hyperdynamic LV contraction and increased   stroke volume. _______________  Left Heart Catheterization 02/26/2019:  Prox RCA to Mid RCA lesion is 15% stenosed.  Ost LAD to Prox LAD lesion is 20% stenosed.  Prox Cx lesion is 20% stenosed.   Mild non-obstructive coronary artery disease with a patent proximal LAD stent with mild 20% intimal hyperplasia (inserted  09/1999); 20% proximal left circumflex narrowing, and mild irregularity of 10 to 15% in the mid RCA.  Hyperdynamic LV function with an "Ace of Spade "configuration and near cavity obliteration in the mid to apical segment during systole.   There is evidence for left ventricular hypertrophy.  LVEDP is 16 mm.  Recommendations: Medical therapy with optimal blood pressure control, lipid management, and resumption of Eliquis tomorrow.  EKG:  EKG ordered today. EKG personally reviewed and demonstrates sinus bradycardia, rate 54 bpm, with LVH with repolarization abnormalities. Deep T wave inversions noted in inferior leads, lead  I, and lead V3-V6. Slightly more prominent than prior tracing in 02/2019; however, thinks these are more repolarization changes from significant LVH rather than ischemic. Biphasive T wave in V2 from 02/2019 has resolved.   Recent Labs: 11/19/2018: TSH 1.85 02/25/2019: ALT 29; B Natriuretic Peptide 703.5 02/27/2019: BUN 23; Creatinine, Ser 1.12; Potassium 4.3; Sodium 141 02/28/2019: Hemoglobin 11.8; Platelets 160  Recent Lipid Panel    Component Value Date/Time   CHOL 156 11/19/2018 1129   TRIG 117.0 11/19/2018 1129   HDL 57.90 11/19/2018 1129   CHOLHDL 3 11/19/2018 1129   VLDL 23.4 11/19/2018 1129   LDLCALC 74 11/19/2018 1129    Physical Exam:    Vital Signs: BP (!) 172/72   Pulse (!) 54   Ht 5' 7.5" (1.715 m)   Wt 176 lb 9.6 oz (80.1 kg)   SpO2 99%   BMI 27.25 kg/m     Wt Readings from Last 3 Encounters:  10/26/19 176 lb 9.6 oz (80.1 kg)  03/11/19 156 lb 3.2 oz (70.9 kg)  02/28/19 161 lb 4.8 oz (73.2 kg)     General: 78 y.o. female in no acute distress. HEENT: Normocephalic and atraumatic. Sclera clear. EOMs intact. Neck: Supple. No carotid bruits. No JVD. Heart: Mildly bradycardic with normal rhythm. Distinct S1 and S2. II/VI systolic murmur noted. No gallops or rubs. Radial pulses 2+ and equal bilaterally. Lungs: No increased work of breathing. Clear to ausculation bilaterally. No wheezes, rhonchi, or rales.  Abdomen: Soft, non-distended, and non-tender to palpation. Bowel sounds present. Extremities: No lower extremity edema.    Skin: Warm and dry. Neuro: Alert and oriented x3. No focal  deficits. Psych: Normal affect. Responds appropriately.  Assessment:    1. Coronary artery disease involving native coronary artery of native heart without angina pectoris   2. Chronic diastolic CHF (congestive heart failure) (HCC)   3. Paroxysmal atrial fibrillation ([place])   4. Essential hypertension   5. Hyperlipidemia, unspecified hyperlipidemia type   6. Obstructive sleep apnea   7. Pre-op evaluation     Plan:    CAD without Angina - Remote stenting to LAD in 2000. Most recent cardiac catheterization in 02/2019 showed mild non-obstructive disease with patent LAD stent. - EKG shows sinus bradycardia and LVH with repolarization abnormalities. Deep T wave inversions noted in inferior leads, lead I, and lead V3-V6. Slightly more prominent than prior tracing in 02/2019; however, thinks these are more repolarization changes from significant LVH rather than ischemic. - Stable. No chest pain.  - Continue medical therapy with high-intensity statin. No Aspirin due to need for Eliquis. No beta-blocker due to bradycardia.   Dyspnea with Exertion and Chronic Diastolic CHF - Most recent Echo from 11/2017 showed LVEF of 70-75% with grade 2 diastolic dysfunction. Although morphology not consistent with hypertrophic cardiomyopathy, there is mildly increased subvalvular outflow tract gradient due to hyperdynamic LV contraction and increased stroke volume. Cardiac cath in 02/2019 hyperdynamic LV function with an "ACE of Spade" configuration and near cavity obliteration in th emid to apical segment during systole.  - Patient continues to report some dyspnea with activity but appears stable. Euvolemic on exam. Think this is due to significant LVH.  - Continue Candesartan 16mg  daily and Spironolactone 25mg  daily.  Paroxysmal Atrial Fibrillation  - Maintaining sinus rhythm. EKG today shows sinus bradycardia with rate of 54 bpm. QTc 398 ms. - Continue Tikosyn 125mg  twice daily.  - Continue anticoagulation with  Eliquis 5mg  twice daily.  - Will check Magnesium and BMET  today.  Hypertension - BP elevated at 172/72. I personally rechecked this during visit and BP was 164/58. BP at previous office visits have been much more controlled. - Will continue current medications (Candesartan and Spironolactone) for now given.  - Recommend patient monitor BP daily at home and notify us if BP consistently above 135/80. Patient was given an automatic BP machine with arm cuff in the office today. Given patient's age and mildly increased subvalvular outflow tract gradient on Echo, may need to be cautious with lowering patient's BP too much.   Hyperlipidemia - Most recent LDL 74 in 11/2018. Slightly above goal of <70 given CAD. - Continue Crestor 40mg  daily. - Labs followed by PCP.  Obstructive Sleep Apnea - Compliant with CPAP.   Fatigue - Patient biggest complaint today is just feeling very fatigued. She gets very tired just walking from one room to the next. - Will check CBC and TSH.  - Patient has plans to see PCP next month. Advised her to discuss this at that time.  Pre-Operative Evaluation  - Patient was planning to right shoulder rotator cuff repair earlier this month. Procedure was cancelled because patient did not want to deal with that before Christmas. Will likely be rescheduled for early 2021.  - Per Revised Cardiac Risk Index, considered low to moderate risk procedure. - Patient had recent cardiac catheterization in 02/2019 that showed mild non-obstructive disease. No angina or acute CHF symptoms at this. Based on ACC/AHA guidelines, patient would be at acceptable risk for the planned procedure without further cardiovascular testing as long as symptoms remain stable. Per Pharmacy and our office protocol, OK to hold Eliquis for 2 days prior to procedure. Should restart as soon as able following procedure. Very important to continue Tikosyn throughout peri-operative periods because if she misses more than  one dose of this, she will require admission to reload her.   Disposition: Follow up in 4 months with Dr Claiborne Billings.   Medication Adjustments/Labs and Tests Ordered: Current medicines are reviewed at length with the patient today.  Concerns regarding medicines are outlined above.  Orders Placed This Encounter  Procedures  . CBC  . Basic metabolic panel  . Magnesium  . TSH  . EKG 12-Lead   No orders of the defined types were placed in this encounter.   Patient Instructions  Medication Instructions:  Sande Rives, PA recommends that you continue on your current medications as directed. Please refer to the Current Medication list given to you today.  *If you need a refill on your cardiac medications before your next appointment, please call your pharmacy*  Lab Work: Your physician recommends that you return for lab work TODAY.  If you have labs (blood work) drawn today and your tests are completely normal, you will receive your results only by: Marland Kitchen MyChart Message (if you have MyChart) OR . A paper copy in the mail If you have any lab test that is abnormal or we need to change your treatment, we will call you to review the results.  Follow-Up: At [facility], you and your health needs are our priority.  As part of our continuing mission to provide you with exceptional heart care, we have created designated Provider Care Teams.  These Care Teams include your primary Cardiologist (physician) and Advanced Practice Providers (APPs -  Physician Assistants and Nurse Practitioners) who all work together to provide you with the care you need, when you need it.  Your next appointment:   4 month(s)  The format for your next appointment:   In Person  Provider:   You may see Shelva Majestic, MD or one of the following Advanced Practice Providers on your designated Care Team:    Almyra Deforest, PA-C  Fabian Sharp, [place] or   Roby Lofts, [place]   Other Instructions  You have been given a BP  monitor  Please check your blood pressure daily and keep a diary using the log sheet provided  Contact the office to speak with a nurse if your blood pressure is consistently 135/80 mmHg    Signed, Eppie Gibson  10/26/2019 12:05 PM    [place]

## 2019-10-26 ENCOUNTER — Other Ambulatory Visit: Payer: Self-pay

## 2019-10-26 ENCOUNTER — Encounter: Payer: Self-pay | Admitting: Physician Assistant

## 2019-10-26 ENCOUNTER — Ambulatory Visit (INDEPENDENT_AMBULATORY_CARE_PROVIDER_SITE_OTHER): Payer: Medicare Other | Admitting: Student

## 2019-10-26 VITALS — BP 172/72 | HR 54 | Ht 67.5 in | Wt 176.6 lb

## 2019-10-26 DIAGNOSIS — G4733 Obstructive sleep apnea (adult) (pediatric): Secondary | ICD-10-CM

## 2019-10-26 DIAGNOSIS — Z01818 Encounter for other preprocedural examination: Secondary | ICD-10-CM

## 2019-10-26 DIAGNOSIS — I2510 Atherosclerotic heart disease of native coronary artery without angina pectoris: Secondary | ICD-10-CM

## 2019-10-26 DIAGNOSIS — I10 Essential (primary) hypertension: Secondary | ICD-10-CM | POA: Diagnosis not present

## 2019-10-26 DIAGNOSIS — I480 Paroxysmal atrial fibrillation: Secondary | ICD-10-CM

## 2019-10-26 DIAGNOSIS — E785 Hyperlipidemia, unspecified: Secondary | ICD-10-CM

## 2019-10-26 DIAGNOSIS — I5032 Chronic diastolic (congestive) heart failure: Secondary | ICD-10-CM

## 2019-10-26 DIAGNOSIS — R5383 Other fatigue: Secondary | ICD-10-CM

## 2019-10-26 NOTE — Patient Instructions (Signed)
Medication Instructions:  Sande Rives, PA recommends that you continue on your current medications as directed. Please refer to the Current Medication list given to you today.  *If you need a refill on your cardiac medications before your next appointment, please call your pharmacy*  Lab Work: Your physician recommends that you return for lab work TODAY.  If you have labs (blood work) drawn today and your tests are completely normal, you will receive your results only by: Marland Kitchen MyChart Message (if you have MyChart) OR . A paper copy in the mail If you have any lab test that is abnormal or we need to change your treatment, we will call you to review the results.  Follow-Up: At [facility], you and your health needs are our priority.  As part of our continuing mission to provide you with exceptional heart care, we have created designated Provider Care Teams.  These Care Teams include your primary Cardiologist (physician) and Advanced Practice Providers (APPs -  Physician Assistants and Nurse Practitioners) who all work together to provide you with the care you need, when you need it.  Your next appointment:   4 month(s)  The format for your next appointment:   In Person  Provider:   You may see Shelva Majestic, MD or one of the following Advanced Practice Providers on your designated Care Team:    Almyra Deforest, PA-C  Fabian Sharp, [place] or   Roby Lofts, [place]   Other Instructions  You have been given a BP monitor  Please check your blood pressure daily and keep a diary using the log sheet provided  Contact the office to speak with a nurse if your blood pressure is consistently 135/80 mmHg

## 2019-10-27 LAB — CBC
Hematocrit: 40.4 % (ref 34.0–46.6)
Hemoglobin: 13.7 g/dL (ref 11.1–15.9)
MCH: 30.2 pg (ref 26.6–33.0)
MCHC: 33.9 g/dL (ref 31.5–35.7)
MCV: 89 fL (ref 79–97)
Platelets: 234 10*3/uL (ref 150–450)
RBC: 4.53 x10E6/uL (ref 3.77–5.28)
RDW: 12.7 % (ref 11.7–15.4)
WBC: 9.2 10*3/uL (ref 3.4–10.8)

## 2019-10-27 LAB — BASIC METABOLIC PANEL
BUN/Creatinine Ratio: 32 — ABNORMAL HIGH (ref 12–28)
BUN: 30 mg/dL — ABNORMAL HIGH (ref 8–27)
CO2: 23 mmol/L (ref 20–29)
Calcium: 10.7 mg/dL — ABNORMAL HIGH (ref 8.7–10.3)
Chloride: 102 mmol/L (ref 96–106)
Creatinine, Ser: 0.93 mg/dL (ref 0.57–1.00)
GFR calc Af Amer: 67 mL/min/{1.73_m2} (ref >59)
GFR calc non Af Amer: 58 mL/min/{1.73_m2} — ABNORMAL LOW (ref >59)
Glucose: 93 mg/dL (ref 65–99)
Potassium: 5.2 mmol/L (ref 3.5–5.2)
Sodium: 136 mmol/L (ref 134–144)

## 2019-10-27 LAB — MAGNESIUM: Magnesium: 1.9 mg/dL (ref 1.6–2.3)

## 2019-10-27 LAB — TSH: TSH: 0.994 u[IU]/mL (ref 0.450–4.500)

## 2019-11-08 ENCOUNTER — Encounter: Payer: Self-pay | Admitting: Family Medicine

## 2019-11-08 NOTE — Telephone Encounter (Signed)
Has the lady who takes her around been tested (the girl's mother)? If she is negative, then the [facility] should be okay

## 2019-11-22 ENCOUNTER — Telehealth: Payer: Self-pay

## 2019-11-22 MED ORDER — DOFETILIDE 125 MCG PO CAPS: 125.0000 ug | ORAL_CAPSULE | Freq: Two times a day (BID) | ORAL | 1 refills | Status: DC

## 2019-11-22 NOTE — Telephone Encounter (Signed)
Refilled Rx and put in [facility].

## 2019-11-25 ENCOUNTER — Encounter: Payer: Self-pay | Admitting: Family Medicine

## 2019-11-25 ENCOUNTER — Ambulatory Visit (INDEPENDENT_AMBULATORY_CARE_PROVIDER_SITE_OTHER): Payer: Medicare Other | Admitting: Family Medicine

## 2019-11-25 ENCOUNTER — Other Ambulatory Visit: Payer: Self-pay

## 2019-11-25 VITALS — BP 136/72 | HR 70 | Temp 97.6°F | Ht 68.0 in | Wt 176.6 lb

## 2019-11-25 DIAGNOSIS — E785 Hyperlipidemia, unspecified: Secondary | ICD-10-CM

## 2019-11-25 DIAGNOSIS — M8949 Other hypertrophic osteoarthropathy, multiple sites: Secondary | ICD-10-CM

## 2019-11-25 DIAGNOSIS — I214 Non-ST elevation (NSTEMI) myocardial infarction: Secondary | ICD-10-CM

## 2019-11-25 DIAGNOSIS — K219 Gastro-esophageal reflux disease without esophagitis: Secondary | ICD-10-CM

## 2019-11-25 DIAGNOSIS — I509 Heart failure, unspecified: Secondary | ICD-10-CM

## 2019-11-25 DIAGNOSIS — M159 Polyosteoarthritis, unspecified: Secondary | ICD-10-CM

## 2019-11-25 DIAGNOSIS — I480 Paroxysmal atrial fibrillation: Secondary | ICD-10-CM | POA: Diagnosis not present

## 2019-11-25 MED ORDER — TRAMADOL HCL 50 MG PO TABS: 100.0000 mg | ORAL_TABLET | Freq: Every day | ORAL | 2 refills | Status: DC

## 2019-11-25 NOTE — Progress Notes (Signed)
Subjective:    Patient ID: Barbara [name], female    DOB: [birth year], 79 y.o.   MRN: [id number]  HPI Here to follow up on issues. She feels well except for right shoulder pain. She sees Dr. Lindwood Qua for this and she was scheduled for a rotator cuff repair last month. However Ora cancelled this because it was too close to the holidays. She wants to reschedule this for later this spring. She deals with the pain fairly well during the day, but she has trouble sleeping at night due to the pain. She had some labs done last month through the Cardiology office, but no lipid panel was done. Unfortunately she is not fasting today. Her GERD is controlled. She has no chest pain but she does get mildly SOB with exertion.     Review of Systems  Constitutional: Negative.   HENT: Negative.   Eyes: Negative.   Respiratory: Positive for shortness of breath.   Cardiovascular: Negative.   Gastrointestinal: Negative.   Genitourinary: Negative for decreased urine volume, difficulty urinating, dyspareunia, dysuria, enuresis, flank pain, frequency, hematuria, pelvic pain and urgency.  Musculoskeletal: Positive for arthralgias.  Skin: Negative.   Neurological: Negative.   Psychiatric/Behavioral: Negative.        Objective:   Physical Exam Constitutional:      General: She is not in acute distress.    Appearance: She is well-developed.  HENT:     Head: Normocephalic and atraumatic.     Right Ear: External ear normal.     Left Ear: External ear normal.     Nose: Nose normal.     Mouth/Throat:     Pharynx: No oropharyngeal exudate.  Eyes:     General: No scleral icterus.    Conjunctiva/sclera: Conjunctivae normal.     Pupils: Pupils are equal, round, and reactive to light.  Neck:     Thyroid: No thyromegaly.     Vascular: No JVD.  Cardiovascular:     Rate and Rhythm: Normal rate and regular rhythm.     Heart sounds: Normal heart sounds. No murmur. No friction rub. No gallop.     Pulmonary:     Effort: Pulmonary effort is normal. No respiratory distress.     Breath sounds: Normal breath sounds. No wheezing or rales.  Chest:     Chest wall: No tenderness.  Abdominal:     General: Bowel sounds are normal. There is no distension.     Palpations: Abdomen is soft. There is no mass.     Tenderness: There is no abdominal tenderness. There is no guarding or rebound.  Musculoskeletal:        General: No tenderness. Normal range of motion.     Cervical back: Normal range of motion and neck supple.  Lymphadenopathy:     Cervical: No cervical adenopathy.  Skin:    General: Skin is warm and dry.     Findings: No erythema or rash.  Neurological:     Mental Status: She is alert and oriented to person, place, and time.     Cranial Nerves: No cranial nerve deficit.     Motor: No abnormal muscle tone.     Coordination: Coordination normal.     Deep Tendon Reflexes: Reflexes are normal and symmetric. Reflexes normal.  Psychiatric:        Behavior: Behavior normal.        Thought Content: Thought content normal.        Judgment: Judgment normal.  Assessment & Plan:  Her HTN and CHF and paroxysmal atrial fibrillation are stable. Her GERD is stable. She will set up a time soon to check fasting lipids. She will follow up with Dr. Gladstone Lighter for the shoulder pain, but in the meantime she will try Tramadol to help with sleep.  Alysia Penna, MD

## 2019-11-26 ENCOUNTER — Telehealth: Payer: Self-pay | Admitting: Family Medicine

## 2019-11-26 NOTE — Telephone Encounter (Signed)
Mary from [facility] called in regarding medication Tramadol for pt Barbara [name] and needing to know if it is acute/chronic pain and what the diagnosis was. Can contact [facility] at [phone].

## 2019-11-26 NOTE — Telephone Encounter (Signed)
Fry patient.  

## 2019-11-26 NOTE — Telephone Encounter (Signed)
Please advise. What is the Dx for this medication?

## 2019-12-02 ENCOUNTER — Other Ambulatory Visit: Payer: Self-pay

## 2019-12-02 ENCOUNTER — Other Ambulatory Visit (INDEPENDENT_AMBULATORY_CARE_PROVIDER_SITE_OTHER): Payer: Medicare Other

## 2019-12-02 DIAGNOSIS — E785 Hyperlipidemia, unspecified: Secondary | ICD-10-CM

## 2019-12-07 LAB — HEPATIC FUNCTION PANEL
ALT: 24 U/L (ref 0–35)
AST: 29 U/L (ref 0–37)
Albumin: 4.2 g/dL (ref 3.5–5.2)
Alkaline Phosphatase: 68 U/L (ref 39–117)
Bilirubin, Direct: 0.1 mg/dL (ref 0.0–0.3)
Total Bilirubin: 0.4 mg/dL (ref 0.2–1.2)
Total Protein: 6.8 g/dL (ref 6.0–8.3)

## 2019-12-07 LAB — LIPID PANEL
Cholesterol: 129 mg/dL (ref 0–200)
HDL: 60.1 mg/dL (ref >39.00)
LDL Cholesterol: 53 mg/dL (ref 0–99)
NonHDL: 68.62
Total CHOL/HDL Ratio: 2
Triglycerides: 80 mg/dL (ref 0.0–149.0)
VLDL: 16 mg/dL (ref 0.0–40.0)

## 2019-12-14 ENCOUNTER — Other Ambulatory Visit: Payer: Self-pay | Admitting: Pharmacist

## 2019-12-14 MED ORDER — APIXABAN 5 MG PO TABS: 5.0000 mg | ORAL_TABLET | Freq: Two times a day (BID) | ORAL | 1 refills | Status: DC

## 2019-12-20 ENCOUNTER — Other Ambulatory Visit: Payer: Self-pay | Admitting: *Deleted

## 2019-12-20 MED ORDER — APIXABAN 5 MG PO TABS: 5.0000 mg | ORAL_TABLET | Freq: Two times a day (BID) | ORAL | 1 refills | Status: DC

## 2019-12-20 NOTE — Telephone Encounter (Signed)
Refilled Eliquis to [facility] as requested. Samples Eliquis 5 mg #2 Lot # [id number] Exp 12/2021 at front for pick up

## 2019-12-23 ENCOUNTER — Other Ambulatory Visit: Payer: Self-pay

## 2019-12-23 MED ORDER — APIXABAN 5 MG PO TABS: 5.0000 mg | ORAL_TABLET | Freq: Two times a day (BID) | ORAL | 1 refills | Status: DC

## 2019-12-26 ENCOUNTER — Ambulatory Visit: Payer: Medicare Other

## 2019-12-26 ENCOUNTER — Ambulatory Visit: Payer: Medicare Other | Attending: Internal Medicine

## 2019-12-26 DIAGNOSIS — Z23 Encounter for immunization: Secondary | ICD-10-CM | POA: Insufficient documentation

## 2019-12-26 NOTE — Progress Notes (Signed)
   Covid-19 Vaccination Clinic  Name:  Barbara [name]    MRN: [id number] DOB: [birth year]  12/26/2019  [name] was observed post Covid-19 immunization for 30 minutes based on pre-vaccination screening without incidence. She was provided with Vaccine Information Sheet and instruction to access the V-Safe system.   [name] was instructed to call 911 with any severe reactions post vaccine: Marland Kitchen Difficulty breathing  . Swelling of your face and throat  . A fast heartbeat  . A bad rash all over your body  . Dizziness and weakness    Immunizations Administered    Name Date Dose VIS Date Route   Pfizer COVID-19 Vaccine 12/26/2019  3:40 PM 0.3 mL 10/15/2019 Intramuscular   Manufacturer: [place]   Lot: [id number]   [place]: [id number]

## 2020-01-18 ENCOUNTER — Other Ambulatory Visit: Payer: Self-pay | Admitting: Orthopedic Surgery

## 2020-01-18 DIAGNOSIS — M25511 Pain in right shoulder: Secondary | ICD-10-CM

## 2020-01-19 ENCOUNTER — Ambulatory Visit: Payer: Medicare Other | Attending: Internal Medicine

## 2020-01-19 ENCOUNTER — Telehealth: Payer: Self-pay | Admitting: *Deleted

## 2020-01-19 DIAGNOSIS — Z23 Encounter for immunization: Secondary | ICD-10-CM

## 2020-01-19 NOTE — Progress Notes (Signed)
   Covid-19 Vaccination Clinic  Name:  Barbara [name]    MRN: [id number] DOB: 23-Aug-1941  01/19/2020  Barbara [name] was observed post Covid-19 immunization for 30 minutes based on pre-vaccination screening without incident. She was provided with Vaccine Information Sheet and instruction to access the V-Safe system.   Barbara [name] was instructed to call 911 with any severe reactions post vaccine: Marland Kitchen Difficulty breathing  . Swelling of face and throat  . A fast heartbeat  . A bad rash all over body  . Dizziness and weakness   Immunizations Administered    Name Date Dose VIS Date Route   Pfizer COVID-19 Vaccine 01/19/2020  1:13 PM 0.3 mL 10/15/2019 Intramuscular   Manufacturer: [place]   Lot: [id number]   [place]: [id number]

## 2020-01-19 NOTE — Telephone Encounter (Signed)
   Primary Cardiologist: Shelva Majestic, MD  Chart reviewed as part of pre-operative protocol coverage.   Patient is cleared for surgery on prior note "Pre-Operative Evaluation  - Patient was planning to right shoulder rotator cuff repair earlier this month. Procedure was cancelled because patient did not want to deal with that before Christmas. Will likely be rescheduled for early 2021.  - Per Revised Cardiac Risk Index, considered low to moderate risk procedure. - Patient had recent cardiac catheterization in 02/2019 that showed mild non-obstructive disease. No angina or acute CHF symptoms at this. Based on ACC/AHA guidelines, patient would be at acceptable risk for the planned procedure without further cardiovascular testing as long as symptoms remain stable. Per Pharmacy and our office protocol, OK to hold Eliquis for 2 days prior to procedure. Should restart as soon as able following procedure. Very important to continue Tikosyn throughout peri-operative periods".    Left voice mail to call back to make sure no change in cardiac status.     [place], [place] 01/19/2020, 1:57 PM

## 2020-01-19 NOTE — Telephone Encounter (Signed)
   [facility] Pre-operative Risk Assessment    Request for surgical clearance:  1. What type of surgery is being performed? Right shoulder replacement   2. When is this surgery scheduled? TBD   3. What type of clearance is required (medical clearance vs. Pharmacy clearance to hold med vs. Both)? both  4. Are there any medications that need to be held prior to surgery and how long? Eliquis   5. Practice name and name of physician performing surgery? [facility] Dr. Stann Mainland   6. What is your office phone number [phone]    7.   What is your office fax number [phone]  8.   Anesthesia type (None, local, MAC, general) ?    Cumi Sanagustin A Oprah Camarena 01/19/2020, 1:39 PM  _________________________________________________________________

## 2020-01-25 NOTE — Telephone Encounter (Signed)
    Primary Cardiologist: Shelva Majestic, MD   Chart reviewed as part of pre-operative protocol coverage. Patient planning on having right shoulder replacement in May 2021. Surgery was initially planned fort this past December but patient did not want to have it around Christmas time.   I saw the patient in the office on 10/26/2019. Per my note at that visit:  - Per Revised Cardiac Risk Index, considered lowto moderaterisk procedure.  - Patient had recent cardiac catheterization in 02/2019 that showed mild non-obstructive disease. No angina or acute CHF symptoms at this time.  - Based on ACC/AHA guidelines,patientwould be at acceptable risk for the planned procedure without further cardiovascular testing as long as symptoms remain stable.   I called patient today to follow-up. No changes in her cardiac status since last visit. Therefore, OK to proceed with surgery.   Per Pharmacy, OK to hold Eliquis for 2 days prior to surgery. This should be resumed as soon as possible following procedure.   Tikosyn should be continued throughout perioperative period.    I will route this recommendation to the requesting party via Epic fax function and remove from pre-op pool.   Please call with questions.   Darreld Mclean, PA-C 01/25/2020 12:30 PM

## 2020-01-25 NOTE — Telephone Encounter (Signed)
Pharmacy, can you please comment on how long patient can hold Eliquis for upcoming procedure?  Thank you! 

## 2020-01-25 NOTE — Telephone Encounter (Signed)
Patient with diagnosis of afib on Eliquis for anticoagulation.    Procedure: Right shoulder replacement  Date of procedure: TBD  CHADS2-VASc score of  6 (CHF, HTN, AGE, CAD, AGE, female)  CrCl 61 ml/min  Per office protocol, patient can hold Eliquis for 2 days prior to procedure.

## 2020-01-31 ENCOUNTER — Encounter: Payer: Self-pay | Admitting: Podiatry

## 2020-01-31 ENCOUNTER — Ambulatory Visit (INDEPENDENT_AMBULATORY_CARE_PROVIDER_SITE_OTHER): Payer: Medicare Other | Admitting: Podiatry

## 2020-01-31 ENCOUNTER — Other Ambulatory Visit: Payer: Self-pay

## 2020-01-31 VITALS — BP 174/73 | HR 60 | Temp 97.7°F

## 2020-01-31 DIAGNOSIS — M79674 Pain in right toe(s): Secondary | ICD-10-CM | POA: Diagnosis not present

## 2020-01-31 DIAGNOSIS — B351 Tinea unguium: Secondary | ICD-10-CM | POA: Diagnosis not present

## 2020-01-31 DIAGNOSIS — M79675 Pain in left toe(s): Secondary | ICD-10-CM

## 2020-01-31 NOTE — Patient Instructions (Signed)

## 2020-02-01 ENCOUNTER — Ambulatory Visit
Admission: RE | Admit: 2020-02-01 | Discharge: 2020-02-01 | Disposition: A | Discharge status: Home / Self Care | Payer: Medicare Other | Source: Ambulatory Visit | Attending: Orthopedic Surgery | Admitting: Orthopedic Surgery

## 2020-02-01 DIAGNOSIS — M25511 Pain in right shoulder: Secondary | ICD-10-CM

## 2020-02-02 NOTE — Progress Notes (Signed)
Subjective: Barbara [name] presents today referred by Laurey Morale, MD with cc of painful, discolored, thick toenails which interfere with daily activities.  Pain is aggravated when wearing enclosed shoe gear.   She states she is monitoring blood pressure at home and will discuss with PCP on next visit.  Past Medical History:  Diagnosis Date  . A-fib ([place])   . Allergy   . CAD (coronary artery disease)    sees Dr. Shelva Majestic  cardiac stents - 2000  . CHF (congestive heart failure) ([place])   . Colon polyps   . Dyspnea    02/12/18 " when my heart gets out of rhythm, it has not been out of rhythym- since I have been on Tikosyn (11/2017)  . Dysrhythmia    afib fib  . GERD (gastroesophageal reflux disease)    takes OTC- Omeprazole- prn  . Heart murmur   . History of stress test    show normal perfusion without scar or ischemia, post EF 68%  . Hx of echocardiogram    show an EF 55%-60% range with grade 1 diastolic dysfunction, she had mitral anular calcification with mild MR, moderate LA dilation and mild pulmonary hypertension with a PA estimated pressure of 40mm  . Hyperlipidemia   . Hypertension   . NSTEMI (non-ST elevated myocardial infarction) ([place])   . Osteoarthritis   . Pneumonia    hx of 2015      Patient Active Problem List   Diagnosis Date Noted  . PAF (paroxysmal atrial fibrillation) ([place]) 02/28/2019  . NSTEMI (non-ST elevated myocardial infarction) ([place]) 02/25/2019  . Pain in left knee 01/06/2018  . Dysphonia 12/29/2017  . 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 CHF (congestive heart failure) ([place]) 11/16/2017  . Gastroesophageal reflux disease 11/06/2017  . Bradycardia 12/27/2015  . Exertional dyspnea 12/23/2014  . Preoperative clearance 12/23/2014  . Hyperlipidemia LDL goal <70 09/13/2014  . Sinus bradycardia 09/13/2014  . LUMBAGO 05/03/2010  . VERTIGO 01/21/2009  . TEMPOROMANDIBULAR JOINT PAIN 08/31/2008  . Osteoarthritis 05/31/2008  . 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 HYPERLIPIDEMIA 04/06/2007  . Hypertensive disorder 04/06/2007  . Coronary atherosclerosis 04/06/2007  . ALLERGIC RHINITIS 04/06/2007  . Personal history of colonic polyps 04/06/2007     Past Surgical History:  Procedure Laterality Date  . CARDIAC CATHETERIZATION     11/2017  . cardiac stents  2000  . COLONOSCOPY  01-05-14   per Dr. Teena Irani, clear, no repeats needed   . CORONARY STENT PLACEMENT  2000   in [facility]  . DIRECT LARYNGOSCOPY WITH RADIAESSE INJECTION N/A 02/13/2018   Procedure: DIRECT LARYNGOSCOPY WITH RADIAESSE INJECTION;  Surgeon: Melida Quitter, MD;  Location: [facility];  Service: ENT;  Laterality: N/A;  . KNEE ARTHROSCOPY Left 01/06/2015   Procedure: LEFT KNEE ARTHROSCOPY, abrasion chondroplasty of the medial femerol condryl,medial and lateral menisectomy, microfracture , synovectomy of the suprpatellar pouch;  Surgeon: Latanya Maudlin, MD;  Location: [facility];  Service: Orthopedics;  Laterality: Left;  . LEFT HEART CATH AND CORONARY ANGIOGRAPHY N/A 02/26/2019   Procedure: LEFT HEART CATH AND CORONARY ANGIOGRAPHY;  Surgeon: Troy Sine, MD;  Location: [facility];  Service: Cardiovascular;  Laterality: N/A;  . MICROLARYNGOSCOPY W/VOCAL CORD INJECTION N/A 08/07/2018   Procedure: MICROLARYNGOSCOPY WITH VOCAL CORD INJECTION OF PROLARYN;  Surgeon: Melida Quitter, MD;  Location: [facility];  Service: ENT;  Laterality: N/A;  JET VENTILATION  . VAGINAL HYSTERECTOMY  1971     Current Outpatient Medications on  File Prior to Visit  Medication Sig Dispense Refill  . apixaban (ELIQUIS) 5 MG TABS tablet Take 1 tablet (5 mg total) by mouth 2 (two) times daily. 180 tablet 1  . candesartan (ATACAND) 16 MG tablet Take 1 tablet (16 mg total) by mouth daily. 90 tablet 3  . Cholecalciferol (VITAMIN D3) 2000 units TABS Take 2,000 Units by mouth daily.    Marland Kitchen desoximetasone (TOPICORT) 0.25 % cream APPLY 1 APPLICATION TOPICALLY 2 TIMES DAILY (Patient taking differently: Apply 1  application topically 2 (two) times daily. ) 60 g 5  . dextromethorphan (DELSYM) 30 MG/5ML liquid Take 15 mg by mouth daily as needed for cough.     . dofetilide (TIKOSYN) 125 MCG capsule Take 1 capsule (125 mcg total) by mouth 2 (two) times daily. 180 capsule 1  . HYDROcodone-acetaminophen (NORCO/VICODIN) 5-325 MG tablet TAKE 1 TO 2 TABLETS BY MOUTH ONCE DAILY    . ipratropium (ATROVENT) 0.06 % nasal spray Place 2 sprays into both nostrils daily as needed for rhinitis.   5  . loratadine (CLARITIN) 10 MG tablet Take 10 mg by mouth daily as needed for allergies.     . Menthol, Topical Analgesic, (BIOFREEZE EX) Apply 1 application topically 3 (three) times daily.    . Multiple Vitamin (MULTIVITAMIN WITH MINERALS) TABS tablet Take 1 tablet by mouth daily.    . naproxen sodium (ALEVE) 220 MG tablet Take 220 mg by mouth at bedtime.    Marland Kitchen omeprazole (PRILOSEC OTC) 20 MG tablet Take 20 mg by mouth daily as needed (for acid reflux).     . rosuvastatin (CRESTOR) 40 MG tablet Take 1 tablet (40 mg total) by mouth daily. 90 tablet 3  . spironolactone (ALDACTONE) 25 MG tablet Take 1 tablet (25 mg total) by mouth daily. 90 tablet 3  . traMADol (ULTRAM) 50 MG tablet Take 2 tablets (100 mg total) by mouth at bedtime. 60 tablet 2  . triamcinolone (NASACORT) 55 MCG/ACT nasal inhaler Place 1 spray into both nostrils daily as needed (for allergies).     . Vitamins/Minerals TABS Take by mouth.     No current facility-administered medications on file prior to visit.     Allergies  Allergen Reactions  . Lidocaine Anaphylaxis  . Morphine Other (See Comments)    "Body shuts down"  . Procaine Hcl Anaphylaxis, Rash and Other (See Comments)    "Anything with 'caine' in it "  . Sulfonamide Derivatives Hives  . Vytorin [Ezetimibe-Simvastatin] Other (See Comments)    Unknown  . Tape Itching     Social History   Occupational History  . Not on file  Tobacco Use  . Smoking status: Former Smoker    Years: 40.00     Quit date: 11/04/1996    Years since quitting: 23.2  . Smokeless tobacco: Never Used  Substance and Sexual Activity  . Alcohol use: No    Alcohol/week: 0.0 standard drinks  . Drug use: No  . Sexual activity: Not on file     Family History  Problem Relation Age of Onset  . Cancer Maternal Grandmother   . Heart disease Maternal Grandfather      Immunization History  Administered Date(s) Administered  . Fluad Quad(high Dose 65+) 08/04/2019  . Influenza Split 10/25/2011, 10/21/2012  . Influenza Whole 10/10/2009, 10/17/2010  . Influenza, High Dose Seasonal PF 08/02/2015, 09/04/2016, 08/14/2017, 08/03/2018  . Influenza,inj,Quad PF,6+ Mos 07/12/2013, 08/24/2014  . Influenza-Unspecified 08/04/2014, 08/07/2015  . PFIZER SARS-COV-2 Vaccination 12/26/2019, 01/19/2020  . Pneumococcal  Conjugate-13 09/04/2015  . Pneumococcal Polysaccharide-23 10/10/2009  . Pneumococcal-Unspecified 08/07/2015  . Tdap 11/06/2017    Review of systems: Positive Findings in bold print.  Constitutional:  chills, fatigue, fever, sweats, weight change Communication: Optometrist, sign Ecologist, hand writing, iPad/Android device Head: headaches, head injury Eyes: changes in vision, eye pain, glaucoma, cataracts, macular degeneration, diplopia, glare,  light sensitivity, eyeglasses or contacts, blindness Ears nose mouth throat: hearing impaired, hearing aids,  ringing in ears, deaf, sign language,  vertigo,   nosebleeds,  rhinitis,  cold sores, snoring, swollen glands Cardiovascular: HTN, edema, arrhythmia, pacemaker in place, defibrillator in place, chest pain/tightness, chronic anticoagulation, blood clot, heart failure, MI Peripheral Vascular: leg cramps, varicose veins, blood clots, lymphedema, varicosities Respiratory:  difficulty breathing, denies congestion, SOB, wheezing, cough, emphysema Gastrointestinal: change in appetite or weight, abdominal pain, constipation, diarrhea, nausea, vomiting, vomiting  blood, change in bowel habits, abdominal pain, jaundice, rectal bleeding, hemorrhoids, GERD Genitourinary:  nocturia,  pain on urination, polyuria,  blood in urine, Foley catheter, urinary urgency, ESRD on hemodialysis Musculoskeletal: amputation, cramping, stiff joints, painful joints, decreased joint motion, fractures, OA, gout, hemiplegia, paraplegia, uses cane, wheelchair bound, uses walker, uses rollator Skin: +changes in toenails, color change, dryness, itching, mole changes,  rash, wound(s) Neurological: headaches, numbness in feet, paresthesias in feet, burning in feet, fainting,  seizures, change in speech. denies headaches, memory problems/poor historian, cerebral palsy, weakness, paralysis, CVA, TIA Endocrine: diabetes, hypothyroidism, hyperthyroidism,  goiter, dry mouth, flushing, heat intolerance,  cold intolerance,  excessive thirst, denies polyuria,  nocturia Hematological:  easy bleeding, excessive bleeding, easy bruising, enlarged lymph nodes, on long term blood thinner, history of past transusions Allergy/immunological:  hives, eczema, frequent infections, multiple drug allergies, seasonal allergies, transplant recipient, multiple food allergies Psychiatric:  anxiety, depression, mood disorder, suicidal ideations, hallucinations, insomnia  Objective: Vitals:   01/31/20 1425  BP: (!) 174/73  Pulse: 60  Temp: 97.7 F (36.5 C)    79 y.o. AA female WD, WN IN NAD. AAO X 3.  Vascular Examination: Capillary refill time to digits immediate b/l. Palpable DP pulses b/l. Palpable PT pulses b/l. Pedal hair sparse b/l. Skin temperature gradient within normal limits b/l. Nonpitting edema noted b/l LE.  Dermatological Examination: Pedal skin with normal turgor, texture and tone bilaterally. No open wounds bilaterally. No interdigital macerations bilaterally. Toenails 1-5 b/l elongated, dystrophic, thickened, crumbly with subungual debris and tenderness to dorsal  palpation.  Musculoskeletal Examination: Normal muscle strength 5/5 to all lower extremity muscle groups bilaterally, no gross bony deformities bilaterally and no pain crepitus or joint limitation noted with ROM b/l  Neurological Examination: Protective sensation intact 5/5 intact bilaterally with 10g monofilament b/l Vibratory sensation intact b/l  Assessment: 1. Pain due to onychomycosis of toenails of both feet    Plan: -Toenails 1-5 b/l were debrided in length and girth with sterile nail nippers and dremel without iatrogenic bleeding.  -Patient to continue soft, supportive shoe gear daily. -Patient to report any pedal injuries to medical professional immediately. -Patient/POA to call should there be question/concern in the interim.  Return in about 3 months (around 05/02/2020) for nail trim/ Eliquis.

## 2020-02-10 ENCOUNTER — Encounter: Payer: Self-pay | Admitting: Family Medicine

## 2020-02-18 DIAGNOSIS — Z4789 Encounter for other orthopedic aftercare: Secondary | ICD-10-CM | POA: Insufficient documentation

## 2020-02-23 ENCOUNTER — Other Ambulatory Visit: Payer: Self-pay | Admitting: Cardiovascular Disease

## 2020-02-25 ENCOUNTER — Other Ambulatory Visit: Payer: Self-pay

## 2020-02-25 ENCOUNTER — Other Ambulatory Visit: Payer: Self-pay | Admitting: *Deleted

## 2020-02-25 MED ORDER — SPIRONOLACTONE 25 MG PO TABS: 25.0000 mg | ORAL_TABLET | Freq: Every day | ORAL | 3 refills | Status: DC

## 2020-02-25 MED ORDER — CANDESARTAN CILEXETIL 16 MG PO TABS: 16.0000 mg | ORAL_TABLET | Freq: Every day | ORAL | 3 refills | Status: DC

## 2020-02-25 MED ORDER — ROSUVASTATIN CALCIUM 40 MG PO TABS: 40.0000 mg | ORAL_TABLET | Freq: Every day | ORAL | 3 refills | Status: DC

## 2020-03-14 NOTE — Progress Notes (Addendum)
No Pharmacies Listed     Your procedure is scheduled on Thursday, Mar 23, 2020 .  Report to [facility] Main Entrance "A" at 5:30 A.M., and check in at the Admitting office.  Call this number if you have problems the morning of surgery:  [phone]  Call [phone] if you have any questions prior to your surgery date Monday-Friday 8am-4pm    Remember:  Do not eat after midnight the night before your surgery  You may drink clear liquids until 4:30 AM the morning of your surgery.   Clear liquids allowed are: Water, Non-Citrus Juices (without pulp), Carbonated Beverages, Clear Tea, Black Coffee Only, and Gatorade   Enhanced Recovery after Surgery for Orthopedics Enhanced Recovery after Surgery is a protocol used to improve the stress on your body and your recovery after surgery.  Patient Instructions  . The night before surgery:  o No food after midnight. ONLY clear liquids after midnight  .  Marland Kitchen The day of surgery (if you do NOT have diabetes):  o Drink ONE (1) Pre-Surgery Clear Ensure as directed.   o This drink was given to you during your hospital  pre-op appointment visit. o The pre-op nurse will instruct you on the time to drink the  Pre-Surgery Ensure depending on your surgery time. o Finish the drink by 4:30 AM the morning of surgery. DO NOT SIP. o Nothing else to drink after completing the  Pre-Surgery Clear Ensure.     Take these medicines the morning of surgery with A SIP OF WATER:  dofetilide (TIKOSYN)  spironolactone (ALDACTONE)   If needed: dextromethorphan (DELSYM)   ipratropium (ATROVENT)  ketotifen (ALAWAY)  loratadine (CLARITIN)  omeprazole (PRILOSEC OTC)  triamcinolone (NASACORT)  Follow your surgeon's instructions on when to stop Eliquis.  If no instructions were given by your surgeon then you will need to call the office to get those instructions.     As of today, STOP taking any Aspirin (unless otherwise instructed by your surgeon) and Aspirin  containing products, Aleve, Naproxen, Ibuprofen, Motrin, Advil, Goody's, BC's, all herbal medications, fish oil, and all vitamins.                      Do not wear jewelry, make up, or nail polish            Do not wear lotions, powders, perfumes, or deodorant.            Do not shave 48 hours prior to surgery.            Do not bring valuables to the hospital.            [facility] is not responsible for any belongings or valuables.  Do NOT Smoke (Tobacco/Vapping) or drink Alcohol 24 hours prior to your procedure If you use a CPAP at night, you may bring all equipment for your overnight stay.   Contacts, glasses, dentures or bridgework may not be worn into surgery.      For patients admitted to the hospital, discharge time will be determined by your treatment team.   Patients discharged the day of surgery will not be allowed to drive home, and someone needs to stay with them for 24 hours.                                     [place]- Preparing for Total Shoulder Arthroplasty  Before surgery, you can play an important role. Because skin is not sterile, your skin needs to be as free of germs as possible. You can reduce the number of germs on your skin by using the following products. . Benzoyl Peroxide Gel o Reduces the number of germs present on the skin o Applied twice a day to shoulder area starting two days before surgery   . Chlorhexidine Gluconate (CHG) Soap o An antiseptic cleaner that kills germs and bonds with the skin to continue killing germs even after washing o Used for showering the night before surgery and morning of surgery   Oral Hygiene is also important to reduce your risk of infection.                                    Remember - BRUSH YOUR TEETH THE MORNING OF SURGERY WITH YOUR REGULAR TOOTHPASTE  ==================================================================  Please follow these instructions carefully:  BENZOYL PEROXIDE 5% GEL  Please do not use  if you have an allergy to benzoyl peroxide.   If your skin becomes reddened/irritated stop using the benzoyl peroxide.  Starting two days before surgery, apply as follows: 1. Apply benzoyl peroxide in the morning and at night. Apply after taking a shower. If you are not taking a shower clean entire shoulder front, back, and side along with the armpit with a clean wet washcloth.  2. Place a quarter-sized dollop on your shoulder and rub in thoroughly, making sure to cover the front, back, and side of your shoulder, along with the armpit.   2 days before ____ AM   ____ PM              1 day before ____ AM   ____ PM                            3.  Do this twice a day for two days.  (Last application is the night before surgery, AFTER using the CHG soap as described below).  4. Do NOT apply benzoyl peroxide gel on the day of surgery.  CHLORHEXIDINE GLUCONATE (CHG) SOAP  Please do not use if you have an allergy to CHG or antibacterial soaps. If your skin becomes reddened/irritated stop using the CHG.   Do not shave (including legs and underarms) for at least 48 hours prior to first CHG shower. It is OK to shave your face.  Starting the night before surgery, use CHG soap as follows:  1. Shower the NIGHT BEFORE SURGERY and MORNING OF SURGERY with CHG.  2. If you choose to wash your hair, wash your hair first as usual with your normal shampoo.  3. After shampooing, rinse your hair and body thoroughly to remove the shampoo.  4. Use CHG as you would any other liquid soap.  You can apply CHG directly to the skin and wash gently with a scrungie or a clean washcloth.  5. Apply the CHG soap to your body ONLY FROM THE NECK DOWN.  Do not use on open wounds or open sores.  Avoid contact with your eyes, ears, mouth, and genitals (private parts).  Wash face and genitals (private parts) with your normal soap.  6. Wash thoroughly, paying special attention to the area where your surgery will be  performed.  7. Thoroughly rinse your body with warm water from the neck down.  8.  DO NOT shower/wash with your normal soap after using and rinsing off the CHG soap.   9. Pat yourself dry with a CLEAN TOWEL.   10.  Apply benzoyl peroxide.   11. Wear CLEAN PAJAMAS to bed the night before surgery; wear comfortable clothes the morning of surgery.  12. Place CLEAN SHEETS on your bed the night of your first shower and DO NOT SLEEP WITH PETS.  Day of Surgery: Shower as above Do not apply any deodorants/lotions.  Please wear clean clothes to the hospital/surgery center.   Remember to brush your teeth WITH YOUR REGULAR TOOTHPASTE.

## 2020-03-15 ENCOUNTER — Encounter (HOSPITAL_COMMUNITY)
Admission: RE | Admit: 2020-03-15 | Discharge: 2020-03-15 | Disposition: A | Discharge status: Home / Self Care | Payer: Medicare Other | Source: Ambulatory Visit | Attending: Orthopedic Surgery | Admitting: Orthopedic Surgery

## 2020-03-15 ENCOUNTER — Encounter (HOSPITAL_COMMUNITY): Payer: Self-pay

## 2020-03-15 ENCOUNTER — Other Ambulatory Visit: Payer: Self-pay

## 2020-03-15 DIAGNOSIS — K219 Gastro-esophageal reflux disease without esophagitis: Secondary | ICD-10-CM | POA: Insufficient documentation

## 2020-03-15 DIAGNOSIS — I252 Old myocardial infarction: Secondary | ICD-10-CM | POA: Diagnosis not present

## 2020-03-15 DIAGNOSIS — R011 Cardiac murmur, unspecified: Secondary | ICD-10-CM | POA: Insufficient documentation

## 2020-03-15 DIAGNOSIS — M75101 Unspecified rotator cuff tear or rupture of right shoulder, not specified as traumatic: Secondary | ICD-10-CM | POA: Insufficient documentation

## 2020-03-15 DIAGNOSIS — I2510 Atherosclerotic heart disease of native coronary artery without angina pectoris: Secondary | ICD-10-CM | POA: Insufficient documentation

## 2020-03-15 DIAGNOSIS — E785 Hyperlipidemia, unspecified: Secondary | ICD-10-CM | POA: Diagnosis not present

## 2020-03-15 DIAGNOSIS — G4733 Obstructive sleep apnea (adult) (pediatric): Secondary | ICD-10-CM | POA: Diagnosis not present

## 2020-03-15 DIAGNOSIS — I509 Heart failure, unspecified: Secondary | ICD-10-CM | POA: Insufficient documentation

## 2020-03-15 DIAGNOSIS — Z01812 Encounter for preprocedural laboratory examination: Secondary | ICD-10-CM | POA: Diagnosis not present

## 2020-03-15 DIAGNOSIS — I4892 Unspecified atrial flutter: Secondary | ICD-10-CM | POA: Insufficient documentation

## 2020-03-15 DIAGNOSIS — Z7901 Long term (current) use of anticoagulants: Secondary | ICD-10-CM | POA: Diagnosis not present

## 2020-03-15 DIAGNOSIS — I110 Hypertensive heart disease with heart failure: Secondary | ICD-10-CM | POA: Diagnosis not present

## 2020-03-15 DIAGNOSIS — Z87891 Personal history of nicotine dependence: Secondary | ICD-10-CM | POA: Insufficient documentation

## 2020-03-15 DIAGNOSIS — I4891 Unspecified atrial fibrillation: Secondary | ICD-10-CM | POA: Insufficient documentation

## 2020-03-15 DIAGNOSIS — Z79899 Other long term (current) drug therapy: Secondary | ICD-10-CM | POA: Diagnosis not present

## 2020-03-15 HISTORY — DX: Other complications of anesthesia, initial encounter: T88.59XA

## 2020-03-15 LAB — CBC
HCT: 43 % (ref 36.0–46.0)
Hemoglobin: 13.7 g/dL (ref 12.0–15.0)
MCH: 29.3 pg (ref 26.0–34.0)
MCHC: 31.9 g/dL (ref 30.0–36.0)
MCV: 91.9 fL (ref 80.0–100.0)
Platelets: 201 10*3/uL (ref 150–400)
RBC: 4.68 MIL/uL (ref 3.87–5.11)
RDW: 12.5 % (ref 11.5–15.5)
WBC: 8.1 10*3/uL (ref 4.0–10.5)
nRBC: 0 % (ref 0.0–0.2)

## 2020-03-15 LAB — SURGICAL PCR SCREEN
MRSA, PCR: NEGATIVE
Staphylococcus aureus: NEGATIVE

## 2020-03-15 LAB — BASIC METABOLIC PANEL
Anion gap: 8 (ref 5–15)
BUN: 30 mg/dL — ABNORMAL HIGH (ref 8–23)
CO2: 27 mmol/L (ref 22–32)
Calcium: 10.6 mg/dL — ABNORMAL HIGH (ref 8.9–10.3)
Chloride: 104 mmol/L (ref 98–111)
Creatinine, Ser: 1.26 mg/dL — ABNORMAL HIGH (ref 0.44–1.00)
GFR calc Af Amer: 47 mL/min — ABNORMAL LOW (ref >60)
GFR calc non Af Amer: 40 mL/min — ABNORMAL LOW (ref >60)
Glucose, Bld: 85 mg/dL (ref 70–99)
Potassium: 5.1 mmol/L (ref 3.5–5.1)
Sodium: 139 mmol/L (ref 135–145)

## 2020-03-15 NOTE — Progress Notes (Signed)
PCP - Ishmael Holter. Sarajane Jews, MD Cardiologist - Shelva Majestic, MD  PPM/ICD - Denies  Chest x-ray - 02/25/19 EKG - 10/26/19 Stress Test - 10/06/14 ECHO - 11/17/17 Cardiac Cath - 02/26/19  Sleep Study - Yes CPAP - Yes  Patient denies being a diabetic.  Blood Thinner Instructions: Stop ELIQUIS 2 DAYS PRIOR TO SX. LAST DOSE 03/20/20 Aspirin Instructions: N/A  ERAS Protcol - Yes PRE-SURGERY Ensure or G2- Ensure given Benzoyl Peroxide Gel given per Shoulder Arthroplasty Order.   COVID TEST- 03/21/20 @ 1205 @ [facility]   Anesthesia review: Yes, cardiac hx.  Patient denies shortness of breath, fever, cough and chest pain at PAT appointment   All instructions explained to the patient, with a verbal understanding of the material. Patient agrees to go over the instructions while at home for a better understanding. Patient also instructed to self quarantine after being tested for COVID-19. The opportunity to ask questions was provided.

## 2020-03-15 NOTE — Progress Notes (Signed)
Your procedure is scheduled on Thursday, Mar 23, 2020, from 07:30 AM- 09:46 AM.  Report to [facility] Main Entrance "A" at 5:30 A.M., and check in at the Admitting office.  Call this number if you have problems the morning of surgery:  [phone]  Call [phone] if you have any questions prior to your surgery date Monday-Friday 8am-4pm.    Remember:  Do not eat after midnight the night before your surgery.  You may drink clear liquids until 4:30 AM the morning of your surgery.    Clear liquids allowed are: Water, Non-Citrus Juices (without pulp), Carbonated Beverages, Clear Tea, Black Coffee Only, and Gatorade   Enhanced Recovery after Surgery for Orthopedics Enhanced Recovery after Surgery is a protocol used to improve the stress on your body and your recovery after surgery.  Patient Instructions  . The night before surgery:  o No food after midnight. ONLY clear liquids after midnight  .  Marland Kitchen The day of surgery (if you do NOT have diabetes):  o Drink ONE (1) Pre-Surgery Clear Ensure by 04:30 AM.   o This drink was given to you during your hospital pre-op appointment visit. o Nothing else to drink after completing the  Pre-Surgery Clear Ensure.     Take these medicines the morning of surgery with A SIP OF WATER:  dofetilide (TIKOSYN)   IF NEEDED: dextromethorphan (DELSYM)   loratadine (CLARITIN)  omeprazole (PRILOSEC OTC)  ipratropium (ATROVENT) nasal spray ketotifen (ALAWAY) eye drops triamcinolone (NASACORT) nasal inhaler  *Follow your surgeon's instructions on when to stop ELIQUIS. *  If no instructions were given by your surgeon then you will need to call the office to get those instructions.     As of today, STOP taking any Aspirin (unless otherwise instructed by your surgeon) and Aspirin containing products, Aleve, Naproxen, Ibuprofen, Motrin, Advil, Goody's, BC's, all herbal medications, fish oil, and all vitamins.                      Do not wear  jewelry, make up, or nail polish            Do not wear lotions, powders, perfumes, or deodorant.            Do not shave 48 hours prior to surgery.            Do not bring valuables to the hospital.            [facility] is not responsible for any belongings or valuables.  Do NOT Smoke (Tobacco/Vapping) or drink Alcohol 24 hours prior to your procedure.  If you use a CPAP at night, you may bring all equipment for your overnight stay.   Contacts, glasses, dentures or bridgework may not be worn into surgery.      For patients admitted to the hospital, discharge time will be determined by your treatment team.   Patients discharged the day of surgery will not be allowed to drive home, and someone needs to stay with them for 24 hours.                                     - Preparing for Total Shoulder Arthroplasty   Before surgery, you can play an important role. Because skin is not sterile, your skin needs to be as free of germs as possible. You can reduce the number of germs on  your skin by using the following products. . Benzoyl Peroxide Gel o Reduces the number of germs present on the skin o Applied twice a day to shoulder area starting two days before surgery   . Chlorhexidine Gluconate (CHG) Soap o An antiseptic cleaner that kills germs and bonds with the skin to continue killing germs even after washing o Used for showering the night before surgery and morning of surgery   Oral Hygiene is also important to reduce your risk of infection.                                    Remember - BRUSH YOUR TEETH THE MORNING OF SURGERY WITH YOUR REGULAR TOOTHPASTE  ==================================================================  Please follow these instructions carefully:  BENZOYL PEROXIDE 5% GEL  Please do not use if you have an allergy to benzoyl peroxide.   If your skin becomes reddened/irritated stop using the benzoyl peroxide.  Starting two days before surgery, apply as  follows: 1. Apply benzoyl peroxide in the morning and at night. Apply after taking a shower. If you are not taking a shower clean entire shoulder front, back, and side along with the armpit with a clean wet washcloth.  2. Place a quarter-sized dollop on your shoulder and rub in thoroughly, making sure to cover the front, back, and side of your shoulder, along with the armpit.   2 days before ____ AM   ____ PM              1 day before ____ AM   ____ PM                          3.  Do this twice a day for two days.  (Last application is the night before surgery, AFTER using the CHG soap as described below).  4. Do NOT apply benzoyl peroxide gel on the day of surgery.  CHLORHEXIDINE GLUCONATE (CHG) SOAP  Please do not use if you have an allergy to CHG or antibacterial soaps. If your skin becomes reddened/irritated stop using the CHG.   Do not shave (including legs and underarms) for at least 48 hours prior to first CHG shower. It is OK to shave your face.  Starting the night before surgery, use CHG soap as follows:  1. Shower the NIGHT BEFORE SURGERY and MORNING OF SURGERY with CHG.  2. If you choose to wash your hair, wash your hair first as usual with your normal shampoo.  3. After shampooing, rinse your hair and body thoroughly to remove the shampoo.  4. Use CHG as you would any other liquid soap.  You can apply CHG directly to the skin and wash gently with a scrungie or a clean washcloth.  5. Apply the CHG soap to your body ONLY FROM THE NECK DOWN.  Do not use on open wounds or open sores.  Avoid contact with your eyes, ears, mouth, and genitals (private parts).  Wash face and genitals (private parts) with your normal soap.  6. Wash thoroughly, paying special attention to the area where your surgery will be performed.  7. Thoroughly rinse your body with warm water from the neck down.  8. DO NOT shower/wash with your normal soap after using and rinsing off the CHG soap.   9. Pat  yourself dry with a CLEAN TOWEL.   10.  Apply benzoyl peroxide.  11. Wear CLEAN PAJAMAS to bed the night before surgery; wear comfortable clothes the morning of surgery.  12. Place CLEAN SHEETS on your bed the night of your first shower and DO NOT SLEEP WITH PETS.  Day of Surgery: Shower as above Do not apply any deodorants/lotions.  Please wear clean clothes to the hospital/surgery center.   Remember to brush your teeth WITH YOUR REGULAR TOOTHPASTE.

## 2020-03-16 ENCOUNTER — Encounter (HOSPITAL_COMMUNITY): Payer: Self-pay

## 2020-03-16 NOTE — Progress Notes (Addendum)
Anesthesia Chart Review:  Case: [id number] Date/Time: 03/23/20 0715   Procedure: REVERSE SHOULDER ARTHROPLASTY (Right Shoulder) - 2.5 hrs RNFA   Anesthesia type: Choice   Pre-op diagnosis: Right rotator cuff arthropathy   Location: [facility] OR ROOM 06 / [facility]   Surgeons: Nicholes Stairs, MD      DISCUSSION: Patient is an 79 year old female scheduled for the above procedure.  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   Allergy to "caine" drugs is listed, so no marcaine was injected in to the knee for her 01/07/15 knee arthroscopy. Topical and Afrin and Xylocaine was used for office transnasal fiberoptic laryngoscopy with Dr. Redmond Baseman on 12/29/17. It is also documented that she received 2 mL 1% lidocaine (Xylocaine) for her 02/26/19 LHC with Shelva Majestic, MD. I called and spoke with patient. She is a fair historian. She does not recall the exact time of her reaction to local anesthesia, but said a dentist told her never to get Novocain (procaine). She says her reaction was itching and rash/hives. She does not recall having difficulty breathing, throat swelling, or having to go to the ER after receiving a "caine". She believes she may have had skin testing at [facility] by Lindwood Coke, MD (retired) years ago and this is when she was told to "avoid caines". Discussed that typically a nerve block (using "caine") is used in conjunction with general anesthesia for shoulder arthroplasties to help with postoperative pain, so I will  attempt to obtain additional information from [facility] in hopes to determine more specifics about her local anesthetic reactions. Although she tolerated Xylocaine in the past few years, we discussed that it would still be up to the assigned anesthesiologist whether to place a nerve block given her history of rash/hives with at least some other "caines" in the past. She says she cannot take Morphine due to "body shuts down" and had significant nausea with tramadol.    Preoperative cardiology input outlined on 01/25/20 by Sande Rives, PA-C. She wrote,  "I saw the patient in the office on 10/26/2019. Per my note at that visit:  - Per Revised Cardiac Risk Index, considered lowto moderaterisk procedure.  - Patient had recent cardiac catheterization in 02/2019 that showed mild non-obstructive disease. No angina or acute CHF symptoms at this time.  - Based on ACC/AHA guidelines,patientwould be at acceptable risk for the planned procedure without further cardiovascular testing as long as symptoms remain stable.   I called patient today to follow-up. No changes in her cardiac status since last visit. Therefore, OK to proceed with surgery.    Per Pharmacy, OK to hold Eliquis for 2 days prior to surgery. This should be resumed as soon as possible following procedure." Patient reported last dose Eliquis prior to surgery to be 03/20/20. She denied chest pain and syncope. Reports chronic DOE with even walking around her house and says she was told this was due to "thickening" of her heart.   [place] COVID-19 vaccine 01/19/20. Presurgical COVID-19 test is scheduled for 03/21/20. Discussed available information with anesthesiologist Renold Don, MD. Will update note  if any additional information obtained from [facility] regarding her "caine" allergies, otherwise anesthesia team to evaluate on the day of surgery.  (UPDATE 03/17/20 9:37 AM: I spoke with nursing staff Caryl Pina at [facility]. She said patient was last seen by Dr.Houston in 2013. She reviewed multiple records and could not find any specifics about which "caines" patient may or may not be able to take. Their records also indicate "allergy to caines". Nurse did not see any skin/patch testing for "caine" allergies. I updated Sherry at Dr. Stann Mainland office that decision regarding nerve block will determined by the assigned anesthesiologist on the day of surgery.)   VS: BP (!) 160/55   Pulse (!) 51   Temp 36.7 C (Oral)   Resp 18   Ht 5\' 7"  (1.702 m)   Wt 78.2 kg   SpO2 100%   BMI 26.99 kg/m    PROVIDERS: Laurey Morale, MD is PCP  - Shelva Majestic, MD is primary cardiologist. Last evaluation 10/26/19 by Sande Rives, PA-C. - Thompson Grayer, MD is EP cardiologist. Last visit 12/22/17. He recommended PRN EP follow-up with continued follow-up with Dr. Claiborne Billings and the [facility]. Last seen by Roderic Palau, NP on 03/17/18.  Melida Quitter, MD is ENT   LABS: Labs reviewed: Acceptable for surgery. K 5.1, BUN 30, Cr 1.26. Cr 0.93-1/27 and K 4.2-5.2 since 02/2019.  (all labs ordered are listed, but only abnormal results are displayed)  Labs Reviewed  BASIC METABOLIC PANEL - Abnormal; Notable for the following components:      Result Value   BUN 30 (*)    Creatinine, Ser 1.26 (*)    Calcium 10.6 (*)    GFR calc non Af Amer 40 (*)    GFR calc Af Amer 47 (*)    All other components within normal limits  SURGICAL PCR SCREEN  CBC     IMAGES: CT right shoulder 02/01/20: IMPRESSION: - Acromioclavicular and glenohumeral osteoarthritis. 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 - Although not as sensitive in evaluation of the rotator cuff as MRI, the patient appears to have a large supraspinatus tendon tear without muscle atrophy.   EKG: 10/26/19 ([facility]):  Sinus bradycardia at 54 bpm Left ventricular hypertrophy with repolarization abnormality. - She has diffuse T wave  inversion which was also present on 02/27/19 tracing.   CV: Cardiac cath 02/26/19:  Prox RCA to Mid RCA lesion is 15% stenosed.  Ost LAD to Prox LAD lesion is 20% stenosed.  Prox Cx lesion is 20% stenosed. - Mild non-obstructive coronary artery disease with a patent proximal LAD stent with mild 20% intimal hyperplasia (inserted  09/1999); 20% proximal left circumflex narrowing, and mild irregularity of 10 to 15% in the mid RCA. - Hyperdynamic LV function with an "Ace of Spade "configuration and near cavity obliteration in the mid to apical segment during systole.  There is evidence for left ventricular hypertrophy. LVEDP is 16 mm. RECOMMENDATION: Medical therapy with optimal blood pressure control, lipid management, and resumption of Eliquis tomorrow.   Echo 11/17/17: Study Conclusions - Left ventricle: The cavity size was normal. Systolic function was vigorous. The estimated ejection fraction was in the range of 70% to 75%. There was dynamic obstruction at restin the outflow tract, with a peak velocity of 289 cm/sec and a peak gradient of 33 mm Hg. Wall motion was normal; there were no regional wall motion abnormalities. Features are consistent with a pseudonormal left ventricular filling pattern, with concomitant abnormal  relaxation and increased filling pressure (grade 2 diastolic dysfunction). - Aortic valve: Valve area (VTI): 2.07 cm^2. Valve area (Vmax): 2.08 cm^2. Valve area (Vmean): 2.3 cm^2. - Mitral valve: There was mild to moderate regurgitation directed centrally. - Left atrium: The atrium was moderately to severely dilated. - Right atrium: The atrium was moderately dilated. - Pulmonary arteries: PA peak pressure: 53 mm Hg (S). Impressions: - Although the morphology is not consistent with hypertrophic cardiomyopathy, there is a mildly increased subvalvular outflow tract gradient due to hyperdynamic LV contraction and increased stroke  volume.   Past Medical History:  Diagnosis Date  . A-fib ([place])   . Allergy   . CAD (coronary artery disease)    sees Dr. Shelva Majestic  cardiac stents - 2000  . CHF (congestive heart failure) ([place])   . Colon polyps   . Complication of anesthesia    rash/hives with "caines"  . Dyspnea    02/12/18 " when my heart gets out of rhythm, it has not been out of rhythym- since I have been on Tikosyn (11/2017)  . Dysrhythmia    afib fib  . GERD (gastroesophageal reflux disease)    takes OTC- Omeprazole- prn  . Heart murmur   . History of stress test    show normal perfusion without scar or ischemia, post EF 68%  . Hx of echocardiogram    show an EF 55%-60% range with grade 1 diastolic dysfunction, she had mitral anular calcification with mild MR, moderate LA dilation and mild pulmonary hypertension with a PA estimated pressure of 46mm  . Hyperlipidemia   . Hypertension   . NSTEMI (non-ST elevated myocardial infarction) ([place])   . Osteoarthritis   . Pneumonia    hx of 2015   . PONV (postoperative nausea and vomiting)     Past Surgical History:  Procedure Laterality Date  . CARDIAC CATHETERIZATION     11/2017  . cardiac stents  2000  . COLONOSCOPY  01-05-14   per Dr. Teena Irani, clear, no repeats needed   . COLONOSCOPY    . CORONARY STENT PLACEMENT  2000   in LAD  . DIRECT LARYNGOSCOPY WITH RADIAESSE INJECTION N/A 02/13/2018   Procedure: DIRECT LARYNGOSCOPY WITH RADIAESSE INJECTION;  Surgeon: Melida Quitter, MD;  Location: [facility];  Service: ENT;  Laterality: N/A;  . EYE SURGERY Left    CATARACT REMOVAL  . KNEE ARTHROSCOPY Left 01/06/2015   Procedure: LEFT KNEE ARTHROSCOPY, abrasion chondroplasty of the medial femerol condryl,medial and lateral menisectomy, microfracture , synovectomy of the suprpatellar pouch;  Surgeon: Latanya Maudlin, MD;  Location: [facility];  Service: Orthopedics;  Laterality: Left;  . LEFT HEART CATH AND CORONARY ANGIOGRAPHY N/A 02/26/2019   Procedure: LEFT HEART CATH AND  CORONARY ANGIOGRAPHY;  Surgeon: Troy Sine, MD;  Location: [facility];  Service: Cardiovascular;  Laterality: N/A;  . MICROLARYNGOSCOPY W/VOCAL CORD INJECTION N/A 08/07/2018   Procedure: MICROLARYNGOSCOPY WITH VOCAL CORD INJECTION OF PROLARYN;  Surgeon: Melida Quitter, MD;  Location: [facility];  Service: ENT;  Laterality: N/A;  JET VENTILATION  . VAGINAL HYSTERECTOMY  1971    MEDICATIONS: . apixaban (ELIQUIS) 5 MG TABS tablet  . Biotin 5000 MCG SUBL  . candesartan (ATACAND) 16 MG tablet  . Cholecalciferol (VITAMIN D3) 2000 units TABS  . dextromethorphan (DELSYM) 30 MG/5ML liquid  . dofetilide (TIKOSYN) 125 MCG capsule  . HOMEOPATHIC PRODUCTS PO  . hypromellose (VISTA GEL DRY EYE RELIEF) 0.3 % GEL ophthalmic ointment  . ipratropium (ATROVENT) 0.06 %  nasal spray  . ketotifen (ALAWAY) 0.025 % ophthalmic solution  . loratadine (CLARITIN) 10 MG tablet  . Multiple Vitamin (MULTIVITAMIN WITH MINERALS) TABS tablet  . naproxen sodium (ALEVE) 220 MG tablet  . omeprazole (PRILOSEC OTC) 20 MG tablet  . rosuvastatin (CRESTOR) 40 MG tablet  . spironolactone (ALDACTONE) 25 MG tablet  . traMADol (ULTRAM) 50 MG tablet  . triamcinolone (NASACORT) 55 MCG/ACT nasal inhaler   No current facility-administered medications for this encounter.    Myra Gianotti, PA-C Surgical Short Stay/Anesthesiology [facility] Phone [phone] [facility] Phone [phone] 03/16/2020 5:53 PM

## 2020-03-21 ENCOUNTER — Other Ambulatory Visit (HOSPITAL_COMMUNITY)
Admission: RE | Admit: 2020-03-21 | Discharge: 2020-03-21 | Disposition: A | Discharge status: Home / Self Care | Payer: Medicare Other | Source: Ambulatory Visit | Attending: Orthopedic Surgery | Admitting: Orthopedic Surgery

## 2020-03-21 DIAGNOSIS — Z01812 Encounter for preprocedural laboratory examination: Secondary | ICD-10-CM | POA: Diagnosis present

## 2020-03-21 DIAGNOSIS — Z20822 Contact with and (suspected) exposure to covid-19: Secondary | ICD-10-CM | POA: Diagnosis not present

## 2020-03-21 LAB — SARS CORONAVIRUS 2 (TAT 6-24 HRS): SARS Coronavirus 2: NEGATIVE

## 2020-03-22 NOTE — Anesthesia Preprocedure Evaluation (Addendum)
Anesthesia Evaluation  Patient identified by MRN, date of birth, ID band Patient awake    Reviewed: Allergy & Precautions, NPO status , Patient's Chart, lab work & pertinent test results  History of Anesthesia Complications (+) PONV and history of anesthetic complications  Airway Mallampati: II  TM Distance: >3 FB Neck ROM: Full   Comment: TMJ Dental no notable dental hx. (+) Teeth Intact, Dental Advisory Given   Pulmonary shortness of breath, pneumonia, former smoker,    Pulmonary exam normal breath sounds clear to auscultation       Cardiovascular hypertension, Pt. on medications + CAD and +CHF  Normal cardiovascular exam+ dysrhythmias + Valvular Problems/Murmurs  Rhythm:Regular Rate:Normal  Echo 11/2017 - Left ventricle: The cavity size was normal. Systolic function was vigorous. The estimated ejection fraction was in the range of 70% to 75%. There was dynamic obstruction at restin the outflow tract, with a peak velocity of 289 cm/sec and a peak gradient of 33 mm Hg. Wall motion was normal; there were no regional wall motion abnormalities. Features are consistent with a pseudonormal left ventricular filling pattern, with concomitant abnormal relaxation and increased filling pressure (grade 2 diastolic dysfunction). - Aortic valve: Valve area (VTI): 2.07 cm^2. Valve area (Vmax): 2.08 cm^2. Valve area (Vmean): 2.3 cm^2. - Mitral valve: There was mild to moderate regurgitation directed centrally. - Left atrium: The atrium was moderately to severely dilated. - Right atrium: The atrium was moderately dilated. - Pulmonary arteries: PA peak pressure: 53 mm Hg (S).  Impressions: - Although the morphology is not consistent with hypertrophic cardiomyopathy, there is a mildly increased subvalvular outflow tract gradient due to hyperdynamic LV contraction and increased stroke volume.   Neuro/Psych  Headaches, negative psych ROS    GI/Hepatic Neg liver ROS, GERD  ,  Endo/Other  negative endocrine ROS  Renal/GU negative Renal ROS  negative genitourinary   Musculoskeletal  (+) Arthritis ,   Abdominal   Peds negative pediatric ROS (+)  Hematology negative hematology ROS (+)   Anesthesia Other Findings   Reproductive/Obstetrics negative OB ROS                            Anesthesia Physical  Anesthesia Plan  ASA: III  Anesthesia Plan: General   Post-op Pain Management:    Induction: Intravenous  PONV Risk Score and Plan: 4 or greater and Ondansetron, Dexamethasone and Treatment may vary due to age or medical condition  Airway Management Planned: Oral ETT  Additional Equipment:   Intra-op Plan:   Post-operative Plan: Extubation in OR  Informed Consent: I have reviewed the patients History and Physical, chart, labs and discussed the procedure including the risks, benefits and alternatives for the proposed anesthesia with the patient or authorized representative who has indicated his/her understanding and acceptance.     Dental advisory given  Plan Discussed with: CRNA  Anesthesia Plan Comments: (Discussed local anesthetic. Given the extremely high dose of bupivacaine required for exparel block, and the fact that surgeons in past have avoided bupivacaine, I do not feel comfortable doing a regional anesthetic without better definition of her allergy.    See PAT note written by Myra Gianotti, PA-C.  Allergy to "caine" drugs is listed, so no marcaine was injected in to the knee for her 01/07/15 knee arthroscopy. Topical and Afrin and Xylocaine was used for office transnasal fiberoptic laryngoscopy with Dr. Redmond Baseman on 12/29/17. It is also documented that she received 2 mL 1% lidocaine (  Xylocaine) for her 02/26/19 LHC with Shelva Majestic, MD. Patient does not recall the exact time of her reaction to local anesthesia, but said a dentist told her never to get Novocain (procaine). She  says her reaction was itching and rash/hives. She does not recall having difficulty breathing, throat swelling, or having to go to the ER after receiving a "caine". She thought she may had had skin testing at [facility] by Lindwood Coke, MD (retired) years ago and this is when she was told to "avoid caines". Nursing staff at [facility] did not see any skin testing results regarding "caine" allergy and did not see any specific documentation regarding "caine" allergy. Patient and Dr. Stann Mainland' staff notified that assigned anesthesiologist will have to evaluate on the day of surgery and will determine if patient is felt to be an acceptable candidate for a nerve block given information available on reported "caine" allergy with tolerance of lidocaine twice since 2019. Patient reported allergy to Morphine and intolerance (nauses) to tramadol.    Preoperative cardiology input outlined on 01/25/20 by Sande Rives, PA-C. She wrote,  "I saw the patient in the office on 10/26/2019. Per my note at that visit:  - Per Revised Cardiac Risk Index, considered lowto moderaterisk procedure.  - Patient had recent cardiac catheterization in 02/2019 that showed mild non-obstructive disease. No angina or acute CHF symptoms at this time.  - Based on ACC/AHA guidelines,patientwould be at acceptable risk for the planned procedure without further cardiovascular testing as long as symptoms remain stable.   I called patient today to follow-up. No changes in her cardiac status since last visit. Therefore, OK to proceed with surgery.    Per Pharmacy,OK to hold Eliquis for 2 days prior to surgery. This should be resumed as soon as possible following procedure."  )       Anesthesia Quick Evaluation

## 2020-03-23 ENCOUNTER — Other Ambulatory Visit: Payer: Self-pay

## 2020-03-23 ENCOUNTER — Encounter (HOSPITAL_COMMUNITY): Payer: Self-pay | Admitting: Orthopedic Surgery

## 2020-03-23 ENCOUNTER — Encounter (HOSPITAL_COMMUNITY)
Admission: RE | Disposition: A | Discharge status: Home / Self Care | Payer: Self-pay | Source: Home / Self Care | Attending: Orthopedic Surgery

## 2020-03-23 ENCOUNTER — Ambulatory Visit (HOSPITAL_COMMUNITY): Payer: Medicare Other | Admitting: Anesthesiology

## 2020-03-23 ENCOUNTER — Ambulatory Visit (HOSPITAL_COMMUNITY)
Admission: RE | Admit: 2020-03-23 | Discharge: 2020-03-24 | Disposition: A | Discharge status: Home / Self Care | Payer: Medicare Other | Attending: Orthopedic Surgery | Admitting: Orthopedic Surgery

## 2020-03-23 ENCOUNTER — Ambulatory Visit (HOSPITAL_COMMUNITY): Payer: Medicare Other | Admitting: Vascular Surgery

## 2020-03-23 ENCOUNTER — Ambulatory Visit (HOSPITAL_COMMUNITY): Payer: Medicare Other

## 2020-03-23 DIAGNOSIS — I110 Hypertensive heart disease with heart failure: Secondary | ICD-10-CM | POA: Diagnosis not present

## 2020-03-23 DIAGNOSIS — M1990 Unspecified osteoarthritis, unspecified site: Secondary | ICD-10-CM | POA: Diagnosis not present

## 2020-03-23 DIAGNOSIS — Z888 Allergy status to other drugs, medicaments and biological substances status: Secondary | ICD-10-CM | POA: Insufficient documentation

## 2020-03-23 DIAGNOSIS — Z87891 Personal history of nicotine dependence: Secondary | ICD-10-CM | POA: Insufficient documentation

## 2020-03-23 DIAGNOSIS — Z7901 Long term (current) use of anticoagulants: Secondary | ICD-10-CM | POA: Insufficient documentation

## 2020-03-23 DIAGNOSIS — I252 Old myocardial infarction: Secondary | ICD-10-CM | POA: Insufficient documentation

## 2020-03-23 DIAGNOSIS — M19011 Primary osteoarthritis, right shoulder: Secondary | ICD-10-CM | POA: Diagnosis not present

## 2020-03-23 DIAGNOSIS — Z79899 Other long term (current) drug therapy: Secondary | ICD-10-CM | POA: Diagnosis not present

## 2020-03-23 DIAGNOSIS — I2510 Atherosclerotic heart disease of native coronary artery without angina pectoris: Secondary | ICD-10-CM | POA: Diagnosis not present

## 2020-03-23 DIAGNOSIS — I509 Heart failure, unspecified: Secondary | ICD-10-CM | POA: Diagnosis not present

## 2020-03-23 DIAGNOSIS — Z885 Allergy status to narcotic agent status: Secondary | ICD-10-CM | POA: Diagnosis not present

## 2020-03-23 DIAGNOSIS — Z96611 Presence of right artificial shoulder joint: Secondary | ICD-10-CM | POA: Diagnosis present

## 2020-03-23 DIAGNOSIS — E785 Hyperlipidemia, unspecified: Secondary | ICD-10-CM | POA: Diagnosis not present

## 2020-03-23 DIAGNOSIS — Z882 Allergy status to sulfonamides status: Secondary | ICD-10-CM | POA: Insufficient documentation

## 2020-03-23 HISTORY — PX: REVERSE SHOULDER ARTHROPLASTY: SHX5054

## 2020-03-23 LAB — PROTIME-INR
INR: 1 (ref 0.8–1.2)
Prothrombin Time: 12.4 seconds (ref 11.4–15.2)

## 2020-03-23 SURGERY — ARTHROPLASTY, SHOULDER, TOTAL, REVERSE
Anesthesia: General | Site: Shoulder | Laterality: Right

## 2020-03-23 MED ORDER — ONDANSETRON HCL 4 MG/2ML IJ SOLN
INTRAMUSCULAR | Status: AC
  Filled 2020-03-23: qty 2

## 2020-03-23 MED ORDER — APIXABAN 5 MG PO TABS
5.0000 mg | ORAL_TABLET | Freq: Two times a day (BID) | ORAL | Status: DC
  Filled 2020-03-23 (×2): qty 1

## 2020-03-23 MED ORDER — PHENOL 1.4 % MT LIQD: 1.0000 | OROMUCOSAL | Status: DC | PRN

## 2020-03-23 MED ORDER — FENTANYL CITRATE (PF) 250 MCG/5ML IJ SOLN
INTRAMUSCULAR | Status: DC | PRN
  Administered 2020-03-23 (×3): 50 ug via INTRAVENOUS
  Administered 2020-03-23: 100 ug via INTRAVENOUS

## 2020-03-23 MED ORDER — NAPROXEN SODIUM 275 MG PO TABS
275.0000 mg | ORAL_TABLET | Freq: Two times a day (BID) | ORAL | Status: DC | PRN
  Filled 2020-03-23: qty 1

## 2020-03-23 MED ORDER — SODIUM CHLORIDE 0.9 % IR SOLN: Status: DC | PRN

## 2020-03-23 MED ORDER — ONDANSETRON HCL 4 MG PO TABS: 4.0000 mg | ORAL_TABLET | Freq: Four times a day (QID) | ORAL | Status: DC | PRN

## 2020-03-23 MED ORDER — HYDROCODONE-ACETAMINOPHEN 5-325 MG PO TABS: 1.0000 | ORAL_TABLET | ORAL | Status: DC | PRN

## 2020-03-23 MED ORDER — PROPOFOL 10 MG/ML IV BOLUS
INTRAVENOUS | Status: AC
  Filled 2020-03-23: qty 20

## 2020-03-23 MED ORDER — AMISULPRIDE (ANTIEMETIC) 5 MG/2ML IV SOLN
INTRAVENOUS | Status: AC
  Filled 2020-03-23: qty 4

## 2020-03-23 MED ORDER — MIDAZOLAM HCL 2 MG/2ML IJ SOLN
INTRAMUSCULAR | Status: AC
  Filled 2020-03-23: qty 2

## 2020-03-23 MED ORDER — IPRATROPIUM BROMIDE 0.06 % NA SOLN
2.0000 | Freq: Every day | NASAL | Status: DC | PRN
  Filled 2020-03-23: qty 15

## 2020-03-23 MED ORDER — VANCOMYCIN HCL 1000 MG IV SOLR
INTRAVENOUS | Status: AC
  Filled 2020-03-23: qty 1000

## 2020-03-23 MED ORDER — SPIRONOLACTONE 25 MG PO TABS
25.0000 mg | ORAL_TABLET | Freq: Every day | ORAL | Status: DC
  Administered 2020-03-24: 25 mg via ORAL
  Filled 2020-03-23 (×2): qty 1

## 2020-03-23 MED ORDER — ROSUVASTATIN CALCIUM 20 MG PO TABS
40.0000 mg | ORAL_TABLET | Freq: Every day | ORAL | Status: DC
  Administered 2020-03-23: 40 mg via ORAL
  Filled 2020-03-23: qty 2

## 2020-03-23 MED ORDER — VANCOMYCIN HCL 1 G IV SOLR
INTRAVENOUS | Status: DC | PRN
  Administered 2020-03-23: 1000 mg via TOPICAL

## 2020-03-23 MED ORDER — DOCUSATE SODIUM 100 MG PO CAPS
100.0000 mg | ORAL_CAPSULE | Freq: Two times a day (BID) | ORAL | Status: DC
  Administered 2020-03-23 – 2020-03-24 (×3): 100 mg via ORAL
  Filled 2020-03-23 (×2): qty 1

## 2020-03-23 MED ORDER — EPHEDRINE SULFATE-NACL 50-0.9 MG/10ML-% IV SOSY
PREFILLED_SYRINGE | INTRAVENOUS | Status: DC | PRN
  Administered 2020-03-23 (×2): 10 mg via INTRAVENOUS

## 2020-03-23 MED ORDER — ACETAMINOPHEN 325 MG PO TABS: 325.0000 mg | ORAL_TABLET | Freq: Four times a day (QID) | ORAL | Status: DC | PRN

## 2020-03-23 MED ORDER — ONDANSETRON HCL 4 MG/2ML IJ SOLN: 4.0000 mg | Freq: Four times a day (QID) | INTRAMUSCULAR | Status: DC | PRN

## 2020-03-23 MED ORDER — DEXAMETHASONE SODIUM PHOSPHATE 10 MG/ML IJ SOLN
INTRAMUSCULAR | Status: AC
  Filled 2020-03-23: qty 1

## 2020-03-23 MED ORDER — FENTANYL CITRATE (PF) 250 MCG/5ML IJ SOLN
INTRAMUSCULAR | Status: AC
  Filled 2020-03-23: qty 5

## 2020-03-23 MED ORDER — PHENYLEPHRINE HCL-NACL 10-0.9 MG/250ML-% IV SOLN
INTRAVENOUS | Status: DC | PRN
  Administered 2020-03-23: 50 ug/min via INTRAVENOUS

## 2020-03-23 MED ORDER — HYDROMORPHONE HCL 1 MG/ML IJ SOLN
0.2500 mg | INTRAMUSCULAR | Status: DC | PRN
  Administered 2020-03-23: 0.5 mg via INTRAVENOUS
  Administered 2020-03-23: 0.25 mg via INTRAVENOUS

## 2020-03-23 MED ORDER — MEPERIDINE HCL 25 MG/ML IJ SOLN: 6.2500 mg | INTRAMUSCULAR | Status: DC | PRN

## 2020-03-23 MED ORDER — IRBESARTAN 75 MG PO TABS
37.5000 mg | ORAL_TABLET | Freq: Every day | ORAL | Status: DC
  Administered 2020-03-24: 37.5 mg via ORAL
  Filled 2020-03-23: qty 0.5

## 2020-03-23 MED ORDER — HYDROCODONE-ACETAMINOPHEN 7.5-325 MG PO TABS
1.0000 | ORAL_TABLET | ORAL | Status: DC | PRN
  Administered 2020-03-23 – 2020-03-24 (×4): 1 via ORAL
  Filled 2020-03-23 (×4): qty 1

## 2020-03-23 MED ORDER — PANTOPRAZOLE SODIUM 40 MG PO TBEC: 40.0000 mg | DELAYED_RELEASE_TABLET | Freq: Every day | ORAL | Status: DC | PRN

## 2020-03-23 MED ORDER — MORPHINE SULFATE (PF) 2 MG/ML IV SOLN: 0.5000 mg | INTRAVENOUS | Status: DC | PRN

## 2020-03-23 MED ORDER — HYDROMORPHONE HCL 1 MG/ML IJ SOLN
INTRAMUSCULAR | Status: AC
  Filled 2020-03-23: qty 1

## 2020-03-23 MED ORDER — CEFAZOLIN SODIUM-DEXTROSE 2-4 GM/100ML-% IV SOLN
2.0000 g | INTRAVENOUS | Status: AC
  Administered 2020-03-23: 2 g via INTRAVENOUS
  Filled 2020-03-23: qty 100

## 2020-03-23 MED ORDER — CEFAZOLIN SODIUM-DEXTROSE 1-4 GM/50ML-% IV SOLN
1.0000 g | Freq: Four times a day (QID) | INTRAVENOUS | Status: AC
  Administered 2020-03-23 (×3): 1 g via INTRAVENOUS
  Filled 2020-03-23 (×3): qty 50

## 2020-03-23 MED ORDER — DEXAMETHASONE SODIUM PHOSPHATE 10 MG/ML IJ SOLN
INTRAMUSCULAR | Status: DC | PRN
  Administered 2020-03-23: 10 mg via INTRAVENOUS

## 2020-03-23 MED ORDER — MIDAZOLAM HCL 2 MG/2ML IJ SOLN
INTRAMUSCULAR | Status: DC | PRN
  Administered 2020-03-23: 2 mg via INTRAVENOUS

## 2020-03-23 MED ORDER — DOFETILIDE 125 MCG PO CAPS
125.0000 ug | ORAL_CAPSULE | Freq: Two times a day (BID) | ORAL | Status: DC
  Administered 2020-03-23 – 2020-03-24 (×2): 125 ug via ORAL
  Filled 2020-03-23 (×3): qty 1

## 2020-03-23 MED ORDER — ONDANSETRON HCL 4 MG/2ML IJ SOLN
INTRAMUSCULAR | Status: DC | PRN
  Administered 2020-03-23: 4 mg via INTRAVENOUS

## 2020-03-23 MED ORDER — 0.9 % SODIUM CHLORIDE (POUR BTL) OPTIME
TOPICAL | Status: DC | PRN
  Administered 2020-03-23: 1000 mL

## 2020-03-23 MED ORDER — PROPOFOL 10 MG/ML IV BOLUS
INTRAVENOUS | Status: DC | PRN
  Administered 2020-03-23: 130 mg via INTRAVENOUS

## 2020-03-23 MED ORDER — METOCLOPRAMIDE HCL 5 MG PO TABS: 5.0000 mg | ORAL_TABLET | Freq: Three times a day (TID) | ORAL | Status: DC | PRN

## 2020-03-23 MED ORDER — TRANEXAMIC ACID-NACL 1000-0.7 MG/100ML-% IV SOLN
1000.0000 mg | INTRAVENOUS | Status: AC
  Administered 2020-03-23: 1000 mg via INTRAVENOUS
  Filled 2020-03-23: qty 100

## 2020-03-23 MED ORDER — METOCLOPRAMIDE HCL 5 MG/ML IJ SOLN: 5.0000 mg | Freq: Three times a day (TID) | INTRAMUSCULAR | Status: DC | PRN

## 2020-03-23 MED ORDER — ONDANSETRON HCL 4 MG/2ML IJ SOLN
4.0000 mg | Freq: Once | INTRAMUSCULAR | Status: AC | PRN
  Administered 2020-03-23: 4 mg via INTRAVENOUS

## 2020-03-23 MED ORDER — ACETAMINOPHEN 500 MG PO TABS
500.0000 mg | ORAL_TABLET | Freq: Four times a day (QID) | ORAL | Status: AC
  Administered 2020-03-23 – 2020-03-24 (×3): 500 mg via ORAL
  Filled 2020-03-23 (×3): qty 1

## 2020-03-23 MED ORDER — MENTHOL 3 MG MT LOZG
1.0000 | LOZENGE | OROMUCOSAL | Status: DC | PRN
  Filled 2020-03-23: qty 9

## 2020-03-23 MED ORDER — LORATADINE 10 MG PO TABS: 10.0000 mg | ORAL_TABLET | Freq: Every day | ORAL | Status: DC | PRN

## 2020-03-23 MED ORDER — OMEPRAZOLE MAGNESIUM 20 MG PO TBEC: 20.0000 mg | DELAYED_RELEASE_TABLET | Freq: Every day | ORAL | Status: DC | PRN

## 2020-03-23 MED ORDER — ROCURONIUM BROMIDE 10 MG/ML (PF) SYRINGE
PREFILLED_SYRINGE | INTRAVENOUS | Status: DC | PRN
  Administered 2020-03-23: 50 mg via INTRAVENOUS

## 2020-03-23 MED ORDER — ROCURONIUM BROMIDE 10 MG/ML (PF) SYRINGE
PREFILLED_SYRINGE | INTRAVENOUS | Status: AC
  Filled 2020-03-23: qty 10

## 2020-03-23 MED ORDER — AMISULPRIDE (ANTIEMETIC) 5 MG/2ML IV SOLN
10.0000 mg | Freq: Once | INTRAVENOUS | Status: AC
  Administered 2020-03-23: 10 mg via INTRAVENOUS

## 2020-03-23 MED ORDER — LACTATED RINGERS IV SOLN: INTRAVENOUS | Status: DC | PRN

## 2020-03-23 MED ORDER — SUGAMMADEX SODIUM 200 MG/2ML IV SOLN
INTRAVENOUS | Status: DC | PRN
  Administered 2020-03-23: 200 mg via INTRAVENOUS

## 2020-03-23 MED ORDER — PHENYLEPHRINE 40 MCG/ML (10ML) SYRINGE FOR IV PUSH (FOR BLOOD PRESSURE SUPPORT)
PREFILLED_SYRINGE | INTRAVENOUS | Status: DC | PRN
  Administered 2020-03-23 (×2): 80 ug via INTRAVENOUS

## 2020-03-23 SURGICAL SUPPLY — 72 items
AID PSTN UNV HD RSTRNT DISP (MISCELLANEOUS) ×1
BIT DRILL 5/64X5 DISP (BIT) ×3 IMPLANT
BIT DRILL FLUTED 3.0 STRL (BIT) ×2 IMPLANT
BLADE SAG 18X100X1.27 (BLADE) ×3 IMPLANT
BSPLAT GLND +2X24 MDLR (Joint) ×1 IMPLANT
CALIBRATOR GLENOID VIP 5-D (SYSTAGENIX WOUND MANAGEMENT) ×2 IMPLANT
CLOSURE STERI-STRIP 1/2X4 (GAUZE/BANDAGES/DRESSINGS) ×1
CLOSURE WOUND 1/2 X4 (GAUZE/BANDAGES/DRESSINGS) ×1
CLSR STERI-STRIP ANTIMIC 1/2X4 (GAUZE/BANDAGES/DRESSINGS) ×1 IMPLANT
COVER SURGICAL LIGHT HANDLE (MISCELLANEOUS) ×3 IMPLANT
COVER WAND RF STERILE (DRAPES) ×1 IMPLANT
CUP SUT UNIV REVERS 36+2 RT (Cup) ×2 IMPLANT
DRAPE IMP U-DRAPE 54X76 (DRAPES) ×6 IMPLANT
DRAPE INCISE IOBAN 66X45 STRL (DRAPES) ×3 IMPLANT
DRAPE ORTHO SPLIT 77X108 STRL (DRAPES) ×6
DRAPE SURG 17X23 STRL (DRAPES) ×3 IMPLANT
DRAPE SURG ORHT 6 SPLT 77X108 (DRAPES) ×2 IMPLANT
DRAPE U-SHAPE 47X51 STRL (DRAPES) ×3 IMPLANT
DRSG AQUACEL AG ADV 3.5X10 (GAUZE/BANDAGES/DRESSINGS) ×3 IMPLANT
DURAPREP 26ML APPLICATOR (WOUND CARE) ×3 IMPLANT
ELECT BLADE 4.0 EZ CLEAN MEGAD (MISCELLANEOUS) ×3
ELECT REM PT RETURN 9FT ADLT (ELECTROSURGICAL) ×3
ELECTRODE BLDE 4.0 EZ CLN MEGD (MISCELLANEOUS) ×1 IMPLANT
ELECTRODE REM PT RTRN 9FT ADLT (ELECTROSURGICAL) ×1 IMPLANT
GLENOID UNI REV MOD 24 +2 LAT (Joint) ×2 IMPLANT
GLENOSPHERE 36 +4 LAT/24 (Joint) ×2 IMPLANT
GLOVE BIO SURGEON STRL SZ7.5 (GLOVE) ×5 IMPLANT
GLOVE BIOGEL PI IND STRL 8 (GLOVE) ×1 IMPLANT
GLOVE BIOGEL PI INDICATOR 8 (GLOVE) ×2
GLOVE SS N UNI LF 8.5 STRL (GLOVE) ×2 IMPLANT
GLOVE SURG SS PI 6.5 STRL IVOR (GLOVE) ×4 IMPLANT
GOWN STRL REUS W/ TWL LRG LVL3 (GOWN DISPOSABLE) ×1 IMPLANT
GOWN STRL REUS W/ TWL XL LVL3 (GOWN DISPOSABLE) ×1 IMPLANT
GOWN STRL REUS W/TWL LRG LVL3 (GOWN DISPOSABLE) ×3
GOWN STRL REUS W/TWL XL LVL3 (GOWN DISPOSABLE) ×9
KIT BASIN OR (CUSTOM PROCEDURE TRAY) ×3 IMPLANT
KIT TURNOVER KIT B (KITS) ×3 IMPLANT
LINER HUMERAL 36 +3MM SM (Shoulder) ×2 IMPLANT
MANIFOLD NEPTUNE II (INSTRUMENTS) ×3 IMPLANT
NDL 1/2 CIR MAYO (NEEDLE) ×1 IMPLANT
NDL HYPO 25GX1X1/2 BEV (NEEDLE) ×1 IMPLANT
NEEDLE 1/2 CIR MAYO (NEEDLE) IMPLANT
NEEDLE HYPO 25GX1X1/2 BEV (NEEDLE) IMPLANT
NS IRRIG 1000ML POUR BTL (IV SOLUTION) ×3 IMPLANT
PACK SHOULDER (CUSTOM PROCEDURE TRAY) ×3 IMPLANT
PAD ARMBOARD 7.5X6 YLW CONV (MISCELLANEOUS) ×4 IMPLANT
PIN NITINOL TARGETER 2.8 (PIN) ×2 IMPLANT
RESTRAINT HEAD UNIVERSAL NS (MISCELLANEOUS) ×3 IMPLANT
SCREW CENTRAL MOD 30MM (Screw) ×2 IMPLANT
SCREW PERI LOCK 5.5X16 (Screw) ×4 IMPLANT
SCREW PERI LOCK 5.5X24 (Screw) ×2 IMPLANT
SCREW PERI LOCK 5.5X32 (Screw) ×2 IMPLANT
SLING ARM IMMOBILIZER LRG (SOFTGOODS) ×2 IMPLANT
SLING ARM IMMOBILIZER MED (SOFTGOODS) IMPLANT
SPONGE LAP 18X18 RF (DISPOSABLE) IMPLANT
SPONGE LAP 4X18 RFD (DISPOSABLE) ×3 IMPLANT
STEM HUM UNIV REV APEX SZ 6 (Stem) ×2 IMPLANT
STRIP CLOSURE SKIN 1/2X4 (GAUZE/BANDAGES/DRESSINGS) ×2 IMPLANT
SUCTION FRAZIER HANDLE 10FR (MISCELLANEOUS) ×3
SUCTION TUBE FRAZIER 10FR DISP (MISCELLANEOUS) ×1 IMPLANT
SUT FIBERWIRE #2 38 T-5 BLUE (SUTURE) ×6
SUT MNCRL AB 4-0 PS2 18 (SUTURE) ×3 IMPLANT
SUT VIC AB 1 CT1 27 (SUTURE) ×3
SUT VIC AB 1 CT1 27XBRD ANBCTR (SUTURE) IMPLANT
SUT VIC AB 2-0 CT1 27 (SUTURE) ×3
SUT VIC AB 2-0 CT1 TAPERPNT 27 (SUTURE) ×1 IMPLANT
SUTURE FIBERWR #2 38 T-5 BLUE (SUTURE) ×1 IMPLANT
SYR CONTROL 10ML LL (SYRINGE) ×1 IMPLANT
TOWEL GREEN STERILE (TOWEL DISPOSABLE) ×3 IMPLANT
TOWEL GREEN STERILE FF (TOWEL DISPOSABLE) ×3 IMPLANT
WATER STERILE IRR 1000ML POUR (IV SOLUTION) ×3 IMPLANT
YANKAUER SUCT BULB TIP NO VENT (SUCTIONS) ×3 IMPLANT

## 2020-03-23 NOTE — Transfer of Care (Signed)
Immediate Anesthesia Transfer of Care Note  Patient: Barbara [name]  Procedure(s) Performed: REVERSE SHOULDER ARTHROPLASTY (Right Shoulder)  Patient Location: PACU  Anesthesia Type:General  Level of Consciousness: sedated and patient cooperative  Airway & Oxygen Therapy: Patient connected to face mask oxygen  Post-op Assessment: Report given to RN, Post -op Vital signs reviewed and stable and Patient moving all extremities  Post vital signs: Reviewed and stable  Last Vitals:  Vitals Value Taken Time  BP 179/74 03/23/20 0945  Temp    Pulse 46 03/23/20 0946  Resp 16 03/23/20 0946  SpO2 100 % 03/23/20 0946  Vitals shown include unvalidated device data.  Last Pain:  Vitals:   03/23/20 0606  PainSc: 0-No pain      Patients Stated Pain Goal: 2 ([id number])  Complications: No apparent anesthesia complications

## 2020-03-23 NOTE — H&P (Signed)
ORTHOPAEDIC H and P  REQUESTING PHYSICIAN: Nicholes Stairs, MD  PCP:  Laurey Morale, MD  Chief Complaint: Right shoulder rotator cuff arthropathy  HPI: Barbara [name] is a 79 y.o. female who complains of recalcitrant right shoulder pain and swelling.  She has had difficulty with activities of daily living.  She is here today for right shoulder replacement.  Past Medical History:  Diagnosis Date  . A-fib ([place])   . Allergy   . CAD (coronary artery disease)    sees Dr. Shelva Majestic  cardiac stents - 2000  . CHF (congestive heart failure) ([place])   . Colon polyps   . Complication of anesthesia    rash/hives with "caines"  . Dyspnea    02/12/18 " when my heart gets out of rhythm, it has not been out of rhythym- since I have been on Tikosyn (11/2017)  . Dysrhythmia    afib fib  . GERD (gastroesophageal reflux disease)    takes OTC- Omeprazole- prn  . Heart murmur   . History of stress test    show normal perfusion without scar or ischemia, post EF 68%  . Hx of echocardiogram    show an EF 55%-60% range with grade 1 diastolic dysfunction, she had mitral anular calcification with mild MR, moderate LA dilation and mild pulmonary hypertension with a PA estimated pressure of 15mm  . Hyperlipidemia   . Hypertension   . NSTEMI (non-ST elevated myocardial infarction) ([place])   . Osteoarthritis   . Pneumonia    hx of 2015   . PONV (postoperative nausea and vomiting)    Past Surgical History:  Procedure Laterality Date  . CARDIAC CATHETERIZATION     11/2017  . cardiac stents  2000  . COLONOSCOPY  01-05-14   per Dr. Teena Irani, clear, no repeats needed   . COLONOSCOPY    . CORONARY STENT PLACEMENT  2000   in [facility]  . DIRECT LARYNGOSCOPY WITH RADIAESSE INJECTION N/A 02/13/2018   Procedure: DIRECT LARYNGOSCOPY WITH RADIAESSE INJECTION;  Surgeon: Melida Quitter, MD;  Location: [facility];  Service: ENT;  Laterality: N/A;  . EYE SURGERY Left    CATARACT REMOVAL  . KNEE ARTHROSCOPY  Left 01/06/2015   Procedure: LEFT KNEE ARTHROSCOPY, abrasion chondroplasty of the medial femerol condryl,medial and lateral menisectomy, microfracture , synovectomy of the suprpatellar pouch;  Surgeon: Latanya Maudlin, MD;  Location: [facility];  Service: Orthopedics;  Laterality: Left;  . LEFT HEART CATH AND CORONARY ANGIOGRAPHY N/A 02/26/2019   Procedure: LEFT HEART CATH AND CORONARY ANGIOGRAPHY;  Surgeon: Troy Sine, MD;  Location: [facility];  Service: Cardiovascular;  Laterality: N/A;  . MICROLARYNGOSCOPY W/VOCAL CORD INJECTION N/A 08/07/2018   Procedure: MICROLARYNGOSCOPY WITH VOCAL CORD INJECTION OF PROLARYN;  Surgeon: Melida Quitter, MD;  Location: [facility];  Service: ENT;  Laterality: N/A;  JET VENTILATION  . VAGINAL HYSTERECTOMY  1971   Social History   Socioeconomic History  . Marital status: Married    Spouse name: Not on file  . Number of children: 2  . Years of education: Not on file  . Highest education level: Not on file  Occupational History  . Not on file  Tobacco Use  . Smoking status: Former Smoker    Years: 40.00    Quit date: 11/04/1996    Years since quitting: 23.3  . Smokeless tobacco: Never Used  Substance and Sexual Activity  . Alcohol use: No    Alcohol/week: 0.0 standard drinks  .  Drug use: No  . Sexual activity: Not on file  Other Topics Concern  . Not on file  Social History Narrative  . Not on file   Social Determinants of Health   Financial Resource Strain:   . Difficulty of Paying Living Expenses:   Food Insecurity:   . Worried About Charity fundraiser in the Last Year:   . Arboriculturist in the Last Year:   Transportation Needs:   . Film/video editor (Medical):   Marland Kitchen Lack of Transportation (Non-Medical):   Physical Activity:   . Days of Exercise per Week:   . Minutes of Exercise per Session:   Stress:   . Feeling of Stress :   Social Connections:   . Frequency of Communication with Friends and Family:   . Frequency of Social  Gatherings with Friends and Family:   . Attends Religious Services:   . Active Member of Clubs or Organizations:   . Attends Archivist Meetings:   Marland Kitchen Marital Status:    Family History  Problem Relation Age of Onset  . Cancer Maternal Grandmother   . Heart disease Maternal Grandfather    Allergies  Allergen Reactions  . Lidocaine Anaphylaxis  . Morphine Other (See Comments)    "Body shuts down"  . Procaine Hcl Anaphylaxis, Rash and Other (See Comments)    "Anything with 'caine' in it "  . Sulfonamide Derivatives Hives  . Tramadol Nausea Only  . Vytorin [Ezetimibe-Simvastatin] Other (See Comments)    Unknown  . Tape Itching   Prior to Admission medications   Medication Sig Start Date End Date Taking? Authorizing Provider  apixaban (ELIQUIS) 5 MG TABS tablet Take 1 tablet (5 mg total) by mouth 2 (two) times daily. 12/23/19  Yes Darreld Mclean, PA-C  Biotin 5000 MCG SUBL Place 10,000 mcg under the tongue daily.   Yes [provider]  candesartan (ATACAND) 16 MG tablet Take 1 tablet (16 mg total) by mouth daily. 02/25/20  Yes Troy Sine, MD  Cholecalciferol (VITAMIN D3) 2000 units TABS Take 2,000 Units by mouth daily.   Yes [provider]  dextromethorphan (DELSYM) 30 MG/5ML liquid Take 15 mg by mouth daily as needed for cough.    Yes [provider]  dofetilide (TIKOSYN) 125 MCG capsule Take 1 capsule (125 mcg total) by mouth 2 (two) times daily. 11/22/19  Yes Troy Sine, MD  HOMEOPATHIC PRODUCTS PO Take 2 tablets by mouth 3 (three) times daily as needed (pain/fatigue). MagniLife Pain and Fatigue Relief (Aconitum napellus 30C HPUS Arsenicum album 30C HPUS Belladonna 30C HPUS Coniinum 30C HPUS Gelsemium sempervirens 30CHPUS Hypericum perforatum 3X, 30C HPUS Kali bichromicum 30C Phosphoricum acidum 30CHPUS Rhus tox 30C HPUS Uricum acidum 30C HPUS Lacticum acidum 30C HPUS Lactose, Magnesium Stearate, Microcrystalline Cellulose)   Yes [provider]  hypromellose (VISTA GEL DRY EYE RELIEF) 0.3 % GEL ophthalmic ointment Place 1 application into both eyes at bedtime.   Yes [provider]  ipratropium (ATROVENT) 0.06 % nasal spray Place 2 sprays into both nostrils daily as needed for rhinitis.  04/28/18  Yes [provider]  ketotifen (ALAWAY) 0.025 % ophthalmic solution Place 1 drop into both eyes 2 (two) times daily as needed (eye irritation.).   Yes [provider]  loratadine (CLARITIN) 10 MG tablet Take 10 mg by mouth daily as needed (allergies/sinuses.).    Yes [provider]  Multiple Vitamin (MULTIVITAMIN WITH MINERALS) TABS tablet Take 1  tablet by mouth daily.   Yes [provider]  naproxen sodium (ALEVE) 220 MG tablet Take 220 mg by mouth 2 (two) times daily as needed (pain.).    Yes [provider]  omeprazole (PRILOSEC OTC) 20 MG tablet Take 20 mg by mouth daily as needed (for acid reflux).    Yes [provider]  rosuvastatin (CRESTOR) 40 MG tablet Take 1 tablet (40 mg total) by mouth daily. Patient taking differently: Take 40 mg by mouth at bedtime.  02/25/20  Yes Troy Sine, MD  spironolactone (ALDACTONE) 25 MG tablet Take 1 tablet (25 mg total) by mouth daily. 02/25/20  Yes Troy Sine, MD  triamcinolone (NASACORT) 55 MCG/ACT nasal inhaler Place 1 spray into both nostrils daily as needed (for allergies).  10/17/12  Yes Laurey Morale, MD  traMADol (ULTRAM) 50 MG tablet Take 2 tablets (100 mg total) by mouth at bedtime. Patient not taking: Reported on 03/01/2020 11/25/19   Laurey Morale, MD   No results found.  Positive ROS: All other systems have been reviewed and were otherwise negative with the exception of those mentioned in the HPI and as above.  Physical Exam: General: Alert, no acute distress Cardiovascular: No pedal edema Respiratory: No cyanosis, no use of accessory musculature GI: No organomegaly, abdomen is soft and  non-tender Skin: No lesions in the area of chief complaint Neurologic: Sensation intact distally Psychiatric: Patient is competent for consent with normal mood and affect Lymphatic: No axillary or cervical lymphadenopathy  MUSCULOSKELETAL:  Right upper extremity is clean, dry, and intact.  No open wounds.  Neurovascular intact.  Assessment: Right shoulder rotator cuff arthropathy  Plan: -Plan is to proceed today with reverse shoulder arthroplasty.  We again reviewed the indications for the procedure.  We discussed the risk and benefits of the procedure as well.  She has provided informed consent to proceed.  - The risks, benefits, and alternatives were discussed with the patient. There are risks associated with the surgery including, but not limited to, problems with anesthesia (death), infection, differences in leg length/angulation/rotation, fracture of bones, loosening or failure of implants, malunion, nonunion, hematoma (blood accumulation) which may require surgical drainage, blood clots, pulmonary embolism, nerve injury, and blood vessel injury. The patient understands these risks and elects to proceed.     Nicholes Stairs, MD Cell [phone]    03/23/2020 7:14 AM

## 2020-03-23 NOTE — Op Note (Signed)
03/23/2020  9:37 AM  PATIENT:  Barbara [name]    PRE-OPERATIVE DIAGNOSIS:  Right rotator cuff arthropathy  POST-OPERATIVE DIAGNOSIS:  Same  PROCEDURE:  Right REVERSE SHOULDER ARTHROPLASTY  SURGEON:  Nicholes Stairs, MD  ASSISTANT: April Green, RNFA  ANESTHESIA:   General  ESTIMATED BLOOD LOSS: 50 cc  PREOPERATIVE INDICATIONS:  Barbara [name] is a  79 y.o. female with a diagnosis of Right rotator cuff arthropathy who failed conservative measures and elected for surgical management.    The risks benefits and alternatives were discussed with the patient preoperatively including but not limited to the risks of infection, bleeding, nerve injury, cardiopulmonary complications, the need for revision surgery, dislocation, brachial plexus palsy, incomplete relief of pain, among others, and the patient was willing to proceed.  OPERATIVE IMPLANTS:  Arthrex Reverse system Size 6 stem 24 + 2 glenoid baseplate 36 +4 glenosphere nuetral humeral tray with + 3 poly  OPERATIVE FINDINGS:  Findings consistent with advanced rotator cuff arthropathy with acetabular rotation of the acromium as well as full-thickness and retracted tears of the supraspinatus and infraspinatus.  The subscapularis and teres minor were intact.  The long head of the biceps was intact but flattened consistent and consistent with superior migration.  OPERATIVE PROCEDURE: The patient was brought to the operating room and placed in the supine position. General anesthesia was administered. IV antibiotics were given. A Foley was placed. Time out was performed. The upper extremity was prepped and draped in usual sterile fashion. The patient was in a beachchair position. Deltopectoral approach was carried out. The biceps was tenodesed to the pectoralis tendon with #2 Fiberwire. The subscapularis was released off of the bone.   I then performed circumferential releases of the humerus, and then dislocated the head,  and then reamed with the reamer to the above named size.  I then applied the jig, and cut the humeral head in 30 of retroversion, and then turned my attention to the glenoid.  Deep retractors were placed, and I resected the labrum, and then placed a guidepin into the center position on the glenoid, with slight inferior inclination. I then reamed over the guidepin, and this created a small metaphyseal cancellus blush inferiorly, removing just the cartilage to the subchondral bone superiorly. The base plate was selected and impacted place, and then I secured it centrally with a nonlocking screw, and I had excellent purchase both inferiorly and superiorly. I placed a short locking screws on anterior and posterior aspects.  I then turned my attention to the glenosphere, and impacted this into place.  The glenosphere was completely seated, and had engagement of the [facility] taper. I then turned my attention back to the humerus.  I sequentially broached, and then trialed, and was found to restore soft tissue tension, and it had 2 finger tightness. Therefore the above named components were selected. The shoulder felt stable throughout functional motion.   I then impacted the real prosthesis into place, as well as the real humeral tray, and reduced the shoulder. The shoulder had excellent motion, and was stable, and I irrigated the wounds copiously.    I then used two #2 FiberWire's through the suture cup suture slots to repair the subscapularis back to bone.  I then irrigated the shoulder copiously once more, repaired the deltopectoral interval with #2 FiberWire followed by subcutaneous Vicryl, then monocryl for the skin,  with Steri-Strips and sterile gauze for the skin. The patient was awakened and returned back in stable and satisfactory  condition. There no complications and they tolerated the procedure well.  All counts were correct x2.  Disposition:  Barbara [name] will be admitted to the orthopedic  floor for routine postoperative care.  She will begin her Eliquis on postoperative day #1.  This is her home regimen.  Otherwise she will be nonweightbearing to the right upper extremity and wear her sling for comfort.  But she may begin active motion with the help of her occupational therapist.

## 2020-03-23 NOTE — Anesthesia Postprocedure Evaluation (Signed)
Anesthesia Post Note  Patient: Barbara [name]  Procedure(s) Performed: REVERSE SHOULDER ARTHROPLASTY (Right Shoulder)     Patient location during evaluation: PACU Anesthesia Type: General Level of consciousness: sedated and patient cooperative Pain management: pain level controlled Vital Signs Assessment: post-procedure vital signs reviewed and stable Respiratory status: spontaneous breathing Cardiovascular status: stable Anesthetic complications: no    Last Vitals:  Vitals:   03/23/20 1146 03/23/20 1619  BP: (!) 162/50 (!) 181/55  Pulse: (!) 52 89  Resp: 16 16  Temp: (!) 36.2 C (!) 36.4 C  SpO2: 99% 99%    Last Pain:  Vitals:   03/23/20 1200  PainSc: [place]

## 2020-03-23 NOTE — Discharge Instructions (Signed)
-  Maintain sling at all times unless doing exercises or activities of daily living.  No lifting with the right arm.  -Maintain postoperative bandage for 2 weeks.  You should keep this in place until your follow-up appointment.  -Apply ice to the right shoulder for 30 minutes out of each hour that you are able and awake.  -For mild to moderate pain use Tylenol and Advil around-the-clock.  For breakthrough pain use oxycodone as necessary.  -You may shower with your postoperative bandage in place but do not submerge underwater.  -Return to see Dr. Stann Mainland in 2 weeks.

## 2020-03-23 NOTE — Brief Op Note (Signed)
03/23/2020  9:29 AM  PATIENT:  Barbara [name]  79 y.o. female  PRE-OPERATIVE DIAGNOSIS:  Right rotator cuff arthropathy  POST-OPERATIVE DIAGNOSIS:  Right rotator cuff arthropathy  PROCEDURE:  Procedure(s): REVERSE SHOULDER ARTHROPLASTY (Right)  SURGEON:  Surgeon(s) and Role:    Nicholes Stairs, MD - Primary  PHYSICIAN ASSISTANT:   ASSISTANTS: April Green, RNFA   ANESTHESIA:   general  EBL:  50 mL   BLOOD ADMINISTERED:none  DRAINS: none   LOCAL MEDICATIONS USED:  NONE  SPECIMEN:  No Specimen  DISPOSITION OF SPECIMEN:  N/A  COUNTS:  YES  TOURNIQUET:  * No tourniquets in log *  DICTATION: .Note written in EPIC  PLAN OF CARE: Admit to inpatient   PATIENT DISPOSITION:  PACU - hemodynamically stable.   Delay start of Pharmacological VTE agent (>24hrs) due to surgical blood loss or risk of bleeding: not applicable

## 2020-03-23 NOTE — Progress Notes (Signed)
Patient transferred to 3C04, notified friend Jerlyn Ly (friend) [phone]. Mariann Laster will be picking her up tomorrow and will need a call before discharge.  Rowe Pavy, RN

## 2020-03-23 NOTE — Anesthesia Procedure Notes (Signed)
Procedure Name: Intubation Date/Time: 03/23/2020 7:40 AM Performed by: Michele Rockers, CRNA Pre-anesthesia Checklist: Patient identified, Emergency Drugs available, Suction available and Patient being monitored Patient Re-evaluated:Patient Re-evaluated prior to induction Oxygen Delivery Method: Circle system utilized Preoxygenation: Pre-oxygenation with 100% oxygen Induction Type: IV induction Ventilation: Mask ventilation without difficulty Laryngoscope Size: Miller and 3 Grade View: Grade I Tube type: Oral Number of attempts: 1 Airway Equipment and Method: Stylet and Oral airway Placement Confirmation: ETT inserted through vocal cords under direct vision,  positive ETCO2 and breath sounds checked- equal and bilateral Secured at: 21 cm Tube secured with: Tape Dental Injury: Teeth and Oropharynx as per pre-operative assessment

## 2020-03-24 ENCOUNTER — Encounter: Payer: Self-pay | Admitting: *Deleted

## 2020-03-24 DIAGNOSIS — M19011 Primary osteoarthritis, right shoulder: Secondary | ICD-10-CM | POA: Diagnosis not present

## 2020-03-24 LAB — BASIC METABOLIC PANEL
Anion gap: 9 (ref 5–15)
BUN: 23 mg/dL (ref 8–23)
CO2: 22 mmol/L (ref 22–32)
Calcium: 9.8 mg/dL (ref 8.9–10.3)
Chloride: 105 mmol/L (ref 98–111)
Creatinine, Ser: 1.66 mg/dL — ABNORMAL HIGH (ref 0.44–1.00)
GFR calc Af Amer: 33 mL/min — ABNORMAL LOW (ref >60)
GFR calc non Af Amer: 29 mL/min — ABNORMAL LOW (ref >60)
Glucose, Bld: 133 mg/dL — ABNORMAL HIGH (ref 70–99)
Potassium: 4.7 mmol/L (ref 3.5–5.1)
Sodium: 136 mmol/L (ref 135–145)

## 2020-03-24 LAB — CBC
HCT: 38.6 % (ref 36.0–46.0)
Hemoglobin: 12.9 g/dL (ref 12.0–15.0)
MCH: 29.9 pg (ref 26.0–34.0)
MCHC: 33.4 g/dL (ref 30.0–36.0)
MCV: 89.4 fL (ref 80.0–100.0)
Platelets: 163 10*3/uL (ref 150–400)
RBC: 4.32 MIL/uL (ref 3.87–5.11)
RDW: 12.5 % (ref 11.5–15.5)
WBC: 13.4 10*3/uL — ABNORMAL HIGH (ref 4.0–10.5)
nRBC: 0 % (ref 0.0–0.2)

## 2020-03-24 LAB — MAGNESIUM: Magnesium: 1.8 mg/dL (ref 1.7–2.4)

## 2020-03-24 MED ORDER — HYDROCODONE-ACETAMINOPHEN 5-325 MG PO TABS: 1.0000 | ORAL_TABLET | Freq: Four times a day (QID) | ORAL | 0 refills | Status: DC | PRN

## 2020-03-24 MED ORDER — METHOCARBAMOL 500 MG PO TABS: 500.0000 mg | ORAL_TABLET | Freq: Four times a day (QID) | ORAL | 0 refills | Status: DC | PRN

## 2020-03-24 MED ORDER — APIXABAN 2.5 MG PO TABS
2.5000 mg | ORAL_TABLET | Freq: Two times a day (BID) | ORAL | Status: DC
  Filled 2020-03-24 (×2): qty 1

## 2020-03-24 MED ORDER — APIXABAN 2.5 MG PO TABS: 2.5000 mg | ORAL_TABLET | Freq: Two times a day (BID) | ORAL | 0 refills | Status: DC

## 2020-03-24 NOTE — Evaluation (Signed)
Occupational Therapy Evaluation Patient Details Name: Barbara [name] MRN: [id number] DOB: Feb 27, 1941 Today's Date: 03/24/2020    History of Present Illness Barbara [name] underwent a right reverse shoudler arthroplasty.   Clinical Impression   This 79 yo female admitted and underwent above presents to acute OT with all education completed and post op handouts provided, we will D/C from acute OT.    Follow Up Recommendations  (follow up per surgeon)    Equipment Recommendations  None recommended by OT       Precautions / Restrictions Precautions Precautions: Shoulder Type of Shoulder Precautions: OK for elbow, wrist, hand exercises; P/AROM for shoulder flexion (0-90), shoulder abduction (0-60), shoulder external rotation (0-30) Shoulder Interventions: Shoulder sling/immobilizer;Off for dressing/bathing/exercises;For comfort(order says off for dressing/bathing/exercises; post op note states for comfort) Precaution Booklet Issued: Yes (comment) Restrictions Weight Bearing Restrictions: Yes RUE Weight Bearing: Non weight bearing      Mobility Bed Mobility Overal bed mobility: Modified Independent             General bed mobility comments: HOB up  Transfers Overall transfer level: Needs assistance Equipment used: None Transfers: Sit to/from Stand Sit to Stand: Modified independent (Device/Increase time);From elevated surface         General transfer comment: Will be using a lift chair at home to sit and sleep in per her report    Balance Overall balance assessment: Needs assistance Sitting-balance support: No upper extremity supported Sitting balance-Leahy Scale: Good     Standing balance support: No upper extremity supported Standing balance-Leahy Scale: Fair                             ADL either performed or assessed with clinical judgement         Vision Patient Visual Report: No change from baseline              Pertinent  Vitals/Pain Pain Assessment: 0-10 Pain Score: 2  Pain Descriptors / Indicators: Aching;Sore(with movment) Pain Intervention(s): Limited activity within patient's tolerance;Monitored during session;Repositioned;Premedicated before session;Ice applied     Hand Dominance Right(pt reports she had very little movement at shoulder pta due to pain)   Extremity/Trunk Assessment Upper Extremity Assessment Upper Extremity Assessment: RUE deficits/detail RUE Deficits / Details: Pt reports limited A/PROM of right shoulder pta due to pain. Today she can do lap slides to end of her knee AROM, can do shoulder flexion AAROM to ~20 degrees, can do shoulder abduction AAROM to ~20 degrees, and can do external rotation ~-10 degrees from neutral. (pt is aware of her maximum stopping points for all of these movements--demonstrated and written on her education material to take home with her)           Communication Communication Communication: No difficulties   Cognition Arousal/Alertness: Awake/alert Behavior During Therapy: WFL for tasks assessed/performed Overall Cognitive Status: Within Functional Limits for tasks assessed                                           Shoulder Instructions Shoulder Instructions Donning/doffing shirt without moving shoulder: Minimal assistance(aware of sequence to direct someone to A her prn) Method for sponge bathing under operated UE: Modified independent Donning/doffing sling/immobilizer: Minimal assistance(aware of sequence to direct someone to A her prn) Correct positioning of sling/immobilizer: Modified independent Pendulum exercises (written home exercise program): (NA)  ROM for elbow, wrist and digits of operated UE: Independent Sling wearing schedule (on at all times/off for ADL's): Independent Proper positioning of operated UE when showering: Independent Dressing change: (NA) Positioning of UE while sleeping: (verbalized understanding after  having her lay down in bed with pillow behind upper arm)    Home Living Family/patient expects to be discharged to:: Private residence Living Arrangements: Spouse/significant other Available Help at Discharge: Family;Available 24 hours/day(husband has dementia, but can help patient prn with direction) Type of Home: House             Bathroom Shower/Tub: Tub/shower unit;Curtain   Bathroom Toilet: Standard     Home Equipment: Bedside commode;Other (comment)(lift chair)          Prior Functioning/Environment Level of Independence: Independent                 OT Problem List: Decreased strength;Impaired UE functional use;Pain         OT Goals(Current goals can be found in the care plan section) Acute Rehab OT Goals Patient Stated Goal: home today  OT Frequency:                AM-PAC OT "6 Clicks" Daily Activity     Outcome Measure Help from another person eating meals?: None Help from another person taking care of personal grooming?: None Help from another person toileting, which includes using toliet, bedpan, or urinal?: None Help from another person bathing (including washing, rinsing, drying)?: A Little Help from another person to put on and taking off regular upper body clothing?: A Little Help from another person to put on and taking off regular lower body clothing?: A Little 6 Click Score: 21   End of Session Equipment Utilized During Treatment: (sling)  Activity Tolerance: Patient tolerated treatment well Patient left: in bed;with call bell/phone within reach  OT Visit Diagnosis: Muscle weakness (generalized) (M62.81);Pain Pain - Right/Left: Right Pain - part of body: Shoulder                Time: [id number] OT Time Calculation (min): 62 min Charges:  OT General Charges $OT Visit: 1 Visit OT Evaluation $OT Eval Moderate Complexity: 1 Mod OT Treatments $Self Care/Home Management : 23-37 mins $Therapeutic Exercise: 8-22 mins  [address],  OTR/L Acute [employer] Pager [phone] Office [phone]     Almon Register 03/24/2020, 9:42 AM

## 2020-03-24 NOTE — Progress Notes (Signed)
   Subjective:  Patient reports pain as mild.  Pain well controlled.  No overnight issues.  Objective:   VITALS:   Vitals:   03/23/20 2352 03/24/20 0408 03/24/20 0750 03/24/20 1201  BP: (!) 160/60 (!) 170/58 (!) 145/88 (!) 170/58  Pulse: (!) 57 (!) 55 60 (!) 54  Resp: 18 18 16 16   Temp: 98.2 F (36.8 C) 98.3 F (36.8 C) 98.8 F (37.1 C) 98.4 F (36.9 C)  TempSrc: Oral Oral Oral Oral  SpO2: 100% 98% 98% 100%  Weight:      Height:        Neurovascular intact Sensation intact distally Intact pulses distally Incision: dressing C/D/I sling in place   Lab Results  Component Value Date   WBC 13.4 (H) 03/24/2020   HGB 12.9 03/24/2020   HCT 38.6 03/24/2020   MCV 89.4 03/24/2020   PLT 163 03/24/2020   BMET    Component Value Date/Time   NA 136 03/24/2020 0710   NA 136 10/26/2019 1153   K 4.7 03/24/2020 0710   CL 105 03/24/2020 0710   CO2 22 03/24/2020 0710   GLUCOSE 133 (H) 03/24/2020 0710   BUN 23 03/24/2020 0710   BUN 30 (H) 10/26/2019 1153   CREATININE 1.66 (H) 03/24/2020 0710   CREATININE 0.81 04/07/2017 0917   CALCIUM 9.8 03/24/2020 0710   GFRNONAA 29 (L) 03/24/2020 0710   GFRAA 33 (L) 03/24/2020 0710     Assessment/Plan: 1 Day Post-Op   Active Problems:   S/P reverse total shoulder arthroplasty, right   Up with therapy - plan for dc home today - precautions discussed - follow up with me in 2 weeks.   Nicholes Stairs 03/24/2020, 1:21 PM   Geralynn Rile, MD [phone]

## 2020-03-24 NOTE — Progress Notes (Signed)
MEDICATION RELATED CONSULT NOTE  Pt is on chronic apixaban for history of afib. Scr bumped to 1.66 requiring a dose adjustment (age>80, SCr >1.5). Per discussion with Dr. Stann Mainland, a short term prescription for apixaban 2.5mg  BID (as the tablets are difficult to cut) was sent into her pharmacy. I counseled the patient on this change and requested that she follow-up with the prescribing provider to reassess her kidney function. Pt stated that she has an appointment on 5/28. I discussed with her that depending on her doctors recommendation and lab work, she may be able to return to her 5mg  BID dosing.   Meagon Duskin, Rande Lawman 03/24/2020,1:59 PM

## 2020-03-24 NOTE — Plan of Care (Signed)
Patient alert and oriented, mae's well, voiding adequate amount of urine, swallowing without difficulty, no c/o pain at time of discharge. Patient discharged home with family. Script and discharged instructions given to patient. Patient and family stated understanding of instructions given. Patient has an appointment with Dr. Rogers   

## 2020-03-31 ENCOUNTER — Ambulatory Visit (INDEPENDENT_AMBULATORY_CARE_PROVIDER_SITE_OTHER): Payer: Medicare Other | Admitting: Cardiovascular Disease

## 2020-03-31 ENCOUNTER — Encounter: Payer: Self-pay | Admitting: Cardiovascular Disease

## 2020-03-31 ENCOUNTER — Other Ambulatory Visit: Payer: Self-pay

## 2020-03-31 VITALS — BP 140/60 | HR 58 | Ht 68.0 in | Wt 170.8 lb

## 2020-03-31 DIAGNOSIS — I5032 Chronic diastolic (congestive) heart failure: Secondary | ICD-10-CM

## 2020-03-31 DIAGNOSIS — Z79899 Other long term (current) drug therapy: Secondary | ICD-10-CM | POA: Diagnosis not present

## 2020-03-31 DIAGNOSIS — G4733 Obstructive sleep apnea (adult) (pediatric): Secondary | ICD-10-CM | POA: Diagnosis not present

## 2020-03-31 DIAGNOSIS — Z7901 Long term (current) use of anticoagulants: Secondary | ICD-10-CM

## 2020-03-31 DIAGNOSIS — I2510 Atherosclerotic heart disease of native coronary artery without angina pectoris: Secondary | ICD-10-CM | POA: Diagnosis not present

## 2020-03-31 DIAGNOSIS — I480 Paroxysmal atrial fibrillation: Secondary | ICD-10-CM

## 2020-03-31 DIAGNOSIS — I10 Essential (primary) hypertension: Secondary | ICD-10-CM

## 2020-03-31 NOTE — Patient Instructions (Signed)
Medication Instructions:  CONTINUE WITH CURRENT MEDICATIONS. NO CHANGES.  *If you need a refill on your cardiac medications before your next appointment, please call your pharmacy*   Lab Work: BMET, [place]  If you have labs (blood work) drawn today and your tests are completely normal, you will receive your results only by: Marland Kitchen MyChart Message (if you have MyChart) OR . A paper copy in the mail If you have any lab test that is abnormal or we need to change your treatment, we will call you to review the results.   Follow-Up: At [facility], you and your health needs are our priority.  As part of our continuing mission to provide you with exceptional heart care, we have created designated Provider Care Teams.  These Care Teams include your primary Cardiologist (physician) and Advanced Practice Providers (APPs -  Physician Assistants and Nurse Practitioners) who all work together to provide you with the care you need, when you need it.  We recommend signing up for the patient portal called "MyChart".  Sign up information is provided on this After Visit Summary.  MyChart is used to connect with patients for Virtual Visits (Telemedicine).  Patients are able to view lab/test results, encounter notes, upcoming appointments, etc.  Non-urgent messages can be sent to your provider as well.   To learn more about what you can do with MyChart, go to [email or web address].    Your next appointment:   3-4 month(s)  The format for your next appointment:   In Person  Provider:   Shelva Majestic, MD

## 2020-04-06 ENCOUNTER — Encounter: Payer: Self-pay | Admitting: Cardiovascular Disease

## 2020-04-06 ENCOUNTER — Other Ambulatory Visit: Payer: Self-pay

## 2020-04-06 ENCOUNTER — Telehealth: Payer: Self-pay

## 2020-04-06 NOTE — Progress Notes (Signed)
Cardiology Office Note    Date:  04/06/2020   ID:  Barbara [name], DOB June 30, 1941, MRN [id number]  PCP:  Barbara Morale, MD  Cardiologist:  Barbara Majestic, MD   1 year follow-up evaluation  History of Present Illness:  Barbara [name] is a 79 y.o. female who has CAD and in November 2000 underwent stenting of a high-grade LAD stenosis with an [id number] 3.518 mm bare-metal stent. A nuclear perfusion study in November 2013 was unchanged from previously and continued to show normal perfusion without scar or ischemia. Ejection fraction was 68%. An echo Doppler study revealed an ejection fraction in the 55-60% range with grade 1 diastolic dysfunction. 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  Additional problems include hypertension as well as hyperlipidemia. Over the past several months she has noticed that she is more tired. She also has noticed more shortness of breath with activity. She has noted some vague indigestion symptoms. She admits to some mild ankle swelling. She has been taking Crestor 10 mg and denies myalgias. She has been on losartan HCT 100/25 as well as amlodipine 10 mg and Lasix 20 mg for blood pressure and peripheral edema.  When I saw her in November. Her blood pressure was controlled. She was bradycardic not on any rate control medication and I raise the possibility of a component of chronotropic incompetence. To further evaluate her exertional dyspnea. She underwent a nuclear perfusion study on 10/06/2014 which was normal. Post stress ejection fraction was 67%. An echo Doppler study done on 10/06/2014 showed an EF of 60-65% with moderate left ventricular hypertrophy. There was grade 1 diastolic dysfunction. She had indeterminate LV filling pressure. There was mild aortic sclerosis without stenosis, mitral annular calcification with mild MR, and mild  dilatation of her left atrium with mild tricuspid regurgitation. Pulmonary pressures were minimally elevated at 31 mm.  She underwent successful left knee surgery by Dr. Lindwood [name] in March 2016. She tolerated surgery well from a cardiovascular standpoint.  An echo Doppler study in March 2017 showed mild LVH with vigorous LV function with an EF of 65-70%. There was grade 2 diastolic dysfunction. There was moderate aortic sclerosis without stenosis. There was mild LA dilation. PA pressures were upper normal.  When I saw her in May 2018, she had noticed issues with ankle swelling. She had stopped taking furosemide since his had expired. I elected to institute spironolactone with her moderate diastolic dysfunction. She was taking amlodipine 10 mg, losartan HCT 100/25 mg and she has been on spironolactone 25 mg daily.   When I saw her in August 2018 she was in sinus rhythm and had bilateral ankle and feet swelling. I reduced her amlodipine to 5 mg and further titrated spironolactone to 25 mg twice a day. Recently she has been feeling well. She went to [place] over Christmas and did well. However, since 11/05/2017 she noticed her heart rate being a little faster and subsequently felt some irregularity. I saw her in the office on 11/10/2017 and she was in atrial flutter with variable block.  Her blood pressure was elevated. I started metoprolol 25 mg twice a day and started eliquis 5 mg twice a day. An echo Doppler study on January14,2019 showed an EF of 70-75% with grade 2 diastolic dysfunction. There was mild to moderate MR, moderately severe LA enlargement, and PA pressure was increased at 53 mm. She was hospitalized and underwent  Tikosyn loading with Dr. Rayann [name], which ultimately pharmacologically cardioverted her back to sinus rhythm. She was seen in follow-up in the atrial fibrillation clinic on 12/01/2017. At that time, she was maintaining sinus rhythm and her QT interval was  stable.   I saw her on December 03, 2017 at which time she was doing well. Subsequently she has seen Dr. Rayann [name]. Has had issues with hoarseness and was referred to Dr. Redmond [name] for ENT evaluation. 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 She is scheduled to undergo a CT of the soft tissue of her neck with contrast on January 20, 2018. She was recently notified that her valsartan has a contaminant. Shedenies any awareness of recurrent atrial fibrillation. She denies significant swelling. She has been maintained on furosemide 40 mg daily, losartan 50 mg for hypertension. She denies bleeding on Eliquis 5 mg twice daily. She continues to be on rosuvastatin for hyperlipidemia. She is on Tikosyn 125 mcg twice a day.   I saw her in the office in March 2019, she has continued to do well.  Due to concerns for obstructive sleep apnea she was referred for a sleep study.  This was done in December 2019 revealed severe sleep apnea with an AHI at 37/h, with REM sleep AHI at 52/h in supine sleep AHI at 47/h.  She had significant oxygen desaturation to a nadir of 77%.  CPAP was instituted and she was titrated up to 11 cm.  She was started on CPAP therapy in 2020 with [facility] as her DME company.  A most recent download from April 7 through Mar 10, 2019 shows that she is meeting compliance standards with 90% of usage days.  She is averaging 6 hours and 42 minutes of usage per night.  At 10 cm water pressure AHI is excellent at 2.9.  There is no leak.  She now has nasal pillows, but previously it had a full facemask which she did not tolerate.  She developed episodes of chest pain and increasing shortness of breath leading to her hospitalization in April 2020.  Was mild troponin elevation and she had inferolateral T wave abnormalities.  She underwent cardiac catheterization.  The results are as shown below and she was found to have nonobstructive CAD with a widely patent previously placed LAD  stent and mild concomitant CAD.  She had LVH with a "Spade" like ventricle.  She was last evaluated by me on Mar 11, 2019 in a telemedicine visit.  At that time she denied any chest pain but admitted to some shortness of breath.  She was sleeping better.  However she was still not sleeping long and often only averaging between 6 to 7 hours of sleep per night which resulted in some fatigue during the day.  She was unaware of breakthrough snoring.  A download was obtained which showed an AHI of 2.9 at 10 cm set pressure.  Since I last saw her 1 year ago, she was evaluated by Charyl Dancer in December 2020 in our office.  She continued to do well without chest pain.  Her blood pressure was elevated.  She was maintaining sinus rhythm on Tikosyn 125 mg twice a day as well as Eliquis 5 mg twice a day.  ECG showed sinus bradycardia at 54 bpm.  She underwent shoulder surgery on Mar 23, 2020 the right reverse shoulder arthroplasty by Dr. Stann Mainland for advanced rotator cuff arthropathy and multiple tears.  His surgery well from a cardiac standpoint.  Since surgery she has been in the sling and during this time has not been able to use CPAP.  She will be having a follow-up orthopedic visit over the next several days and hopefully the sling will be taken off and CPAP can be reinstituted.  She denies any recent chest tightness or pressure.  She is unaware of palpitations.  She presents for evaluation.   Past Medical History:  Diagnosis Date  . A-fib ([place])   . Allergy   . CAD (coronary artery disease)    sees Dr. Shelva [name]  cardiac stents - 2000  . CHF (congestive heart failure) ([place])   . Colon polyps   . Complication of anesthesia    rash/hives with "caines"  . Dyspnea    02/12/18 " when my heart gets out of rhythm, it has not been out of rhythym- since I have been on Tikosyn (11/2017)  . Dysrhythmia    afib fib  . GERD (gastroesophageal reflux disease)    takes OTC- Omeprazole- prn  . Heart murmur   .  History of stress test    show normal perfusion without scar or ischemia, post EF 68%  . Hx of echocardiogram    show an EF 55%-60% range with grade 1 diastolic dysfunction, she had mitral anular calcification with mild MR, moderate LA dilation and mild pulmonary hypertension with a PA estimated pressure of 76m  . Hyperlipidemia   . Hypertension   . [place]STEMI (non-ST elevated [place] infarction) [place]   . Osteoarthritis   . Pneumonia    hx of 2015   . PONV (postoperative nausea and vomiting)     Past Surgical History:  Procedure Laterality Date  . CARDIAC CATHETERIZATION     11/2017  . cardiac stents  2000  . COLONOSCOPY  01-05-14   per Dr. JTeena Irani clear, no repeats needed   . COLONOSCOPY    . CORONARY STENT PLACEMENT  2000   in LAD  . DIRECT LARYNGOSCOPY WITH RADIAESSE INJECTION N/A 02/13/2018   Procedure: DIRECT LARYNGOSCOPY WITH RADIAESSE INJECTION;  Surgeon: BMelida Quitter MD;  [facility] [facility]  Service: ENT;  Laterality: N/A;  . EYE SURGERY Left    CATARACT REMOVAL  . KNEE ARTHROSCOPY Left 01/06/2015   Procedure: LEFT KNEE ARTHROSCOPY, abrasion chondroplasty of the medial femerol condryl,medial and lateral menisectomy, microfracture , synovectomy of the suprpatellar pouch;  Surgeon: RLatanya Maudlin MD;  Location: [facility];  Service: Orthopedics;  Laterality: Left;  . LEFT HEART CATH AND CORONARY ANGIOGRAPHY N/A 02/26/2019   Procedure: LEFT HEART CATH AND CORONARY ANGIOGRAPHY;  Surgeon: KTroy Sine MD;  [facility];  Service: Cardiovascular;  Laterality: N/A;  . MICROLARYNGOSCOPY W/VOCAL CORD INJECTION N/A 08/07/2018   Procedure: MICROLARYNGOSCOPY WITH VOCAL CORD INJECTION OF PROLARYN;  Surgeon: BMelida Quitter MD;  [place] [facility]  Service: ENT;  Laterality: N/A;  JET VENTILATION  . REVERSE SHOULDER ARTHROPLASTY Right 03/23/2020   Procedure: REVERSE SHOULDER ARTHROPLASTY;  Surgeon: RNicholes Stairs MD;  [place] [facility]  Service: Orthopedics;  Laterality:  Right;  .Marland KitchenVAGINAL HYSTERECTOMY  1971    Current Medications: Outpatient Medications Prior to Visit  Medication Sig Dispense Refill  . apixaban (ELIQUIS) 2.5 MG TABS tablet Take 1 tablet (2.5 mg total) by mouth 2 (two) times daily. Please contact primary care for follow-up 30 tablet 0  . apixaban (ELIQUIS) 5 MG TABS tablet Take 1 tablet (5 mg total) by mouth 2 (two) times daily. 180 tablet 1  .  Biotin 5000 MCG SUBL Place 10,000 mcg under the tongue daily.    . candesartan (ATACAND) 16 MG tablet Take 1 tablet (16 mg total) by mouth daily. 90 tablet 3  . Cholecalciferol (VITAMIN D3) 2000 units TABS Take 2,000 Units by mouth daily.    Marland Kitchen dextromethorphan (DELSYM) 30 MG/5ML liquid Take 15 mg by mouth daily as needed for cough.     . dofetilide (TIKOSYN) 125 MCG capsule Take 1 capsule (125 mcg total) by mouth 2 (two) times daily. 180 capsule 1  . HOMEOPATHIC PRODUCTS PO Take 2 tablets by mouth 3 (three) times daily as needed (pain/fatigue). MagniLife Pain and Fatigue Relief (Aconitum napellus 30C HPUS Arsenicum album 30C HPUS Belladonna 30C HPUS Coniinum 30C HPUS Gelsemium sempervirens 30CHPUS Hypericum perforatum 3X, 30C HPUS Kali bichromicum 30C Phosphoricum acidum 30CHPUS Rhus tox 30C HPUS Uricum acidum 30C HPUS Lacticum acidum 30C HPUS Lactose, Magnesium Stearate, Microcrystalline Cellulose)    . HYDROcodone-acetaminophen (NORCO) 5-325 MG tablet Take 1 tablet by mouth every 6 (six) hours as needed for moderate pain. 30 tablet 0  . hypromellose (VISTA GEL DRY EYE RELIEF) 0.3 % GEL ophthalmic ointment Place 1 application into both eyes at bedtime.    Marland Kitchen ipratropium (ATROVENT) 0.06 % nasal spray Place 2 sprays into both nostrils daily as needed for rhinitis.   5  . ketotifen (ALAWAY) 0.025 % ophthalmic solution Place 1 drop into both eyes 2 (two) times daily as needed (eye irritation.).    Marland Kitchen loratadine (CLARITIN) 10 MG tablet Take 10 mg by mouth daily as needed (allergies/sinuses.).     Marland Kitchen Multiple  Vitamin (MULTIVITAMIN WITH MINERALS) TABS tablet Take 1 tablet by mouth daily.    . naproxen sodium (ALEVE) 220 MG tablet Take 220 mg by mouth 2 (two) times daily as needed (pain.).     Marland Kitchen omeprazole (PRILOSEC OTC) 20 MG tablet Take 20 mg by mouth daily as needed (for acid reflux).     . rosuvastatin (CRESTOR) 40 MG tablet Take 1 tablet (40 mg total) by mouth daily. (Patient taking differently: Take 40 mg by mouth at bedtime. ) 90 tablet 3  . spironolactone (ALDACTONE) 25 MG tablet Take 1 tablet (25 mg total) by mouth daily. 90 tablet 3  . triamcinolone (NASACORT) 55 MCG/ACT nasal inhaler Place 1 spray into both nostrils daily as needed (for allergies).     . methocarbamol (ROBAXIN) 500 MG tablet Take 1 tablet (500 mg total) by mouth every 6 (six) hours as needed for muscle spasms. (Patient not taking: Reported on 03/31/2020) 50 tablet 0   No facility-administered medications prior to visit.     Allergies:   Lidocaine, Morphine, Procaine hcl, Sulfonamide derivatives, Tramadol, Vytorin [ezetimibe-simvastatin], and Tape   Social History   Socioeconomic History  . Marital status: Married    Spouse name: Not on file  . Number of children: 2  . Years of education: Not on file  . Highest education level: Not on file  Occupational History  . Not on file  Tobacco Use  . Smoking status: Former Smoker    Years: 40.00    Quit date: 11/04/1996    Years since quitting: 23.4  . Smokeless tobacco: Never Used  Substance and Sexual Activity  . Alcohol use: No    Alcohol/week: 0.0 standard drinks  . Drug use: No  . Sexual activity: Not on file  Other Topics Concern  . Not on file  Social History Narrative  . Not on file   Social Determinants of  Health   Financial Resource Strain:   . Difficulty of Paying Living Expenses:   Food Insecurity:   . Worried About Charity fundraiser in the Last Year:   . Arboriculturist in the Last Year:   Transportation Needs:   . Film/video editor  (Medical):   Marland Kitchen Lack of Transportation (Non-Medical):   Physical Activity:   . Days of Exercise per Week:   . Minutes of Exercise per Session:   Stress:   . Feeling of Stress :   Social Connections:   . Frequency of Communication with Friends and Family:   . Frequency of Social Gatherings with Friends and Family:   . Attends Religious Services:   . Active Member of Clubs or Organizations:   . Attends Archivist Meetings:   Marland Kitchen Marital Status:      Family History:  The patient'sfamily history includes Cancer in her maternal grandmother; Heart disease in her maternal grandfather.   ROS General: Negative; No fevers, chills, or night sweats;  HEENT: Negative; No changes in vision or hearing, sinus congestion, difficulty swallowing Pulmonary: Negative; No cough, wheezing, shortness of breath, hemoptysis Cardiovascular: Negative; No chest pain, presyncope, syncope, palpitations GI: Negative; No nausea, vomiting, diarrhea, or abdominal pain GU: Negative; No dysuria, hematuria, or difficulty voiding Musculoskeletal: Negative; no myalgias, joint pain, or weakness Hematologic/Oncology: Negative; no easy bruising, bleeding Endocrine: Negative; no heat/cold intolerance; no diabetes Neuro: Negative; no changes in balance, headaches Skin: Negative; No rashes or skin lesions Psychiatric: Negative; No behavioral problems, depression Sleep: Negative; No snoring, daytime sleepiness, hypersomnolence, bruxism, restless legs, hypnogognic hallucinations, no cataplexy Other comprehensive 14 point system review is negative.   PHYSICAL EXAM:   VS:  BP 140/60   Pulse (!) 58   Ht '5\' 8"'  (1.727 m)   Wt 170 lb 12.8 oz (77.5 kg)   SpO2 99%   BMI 25.97 kg/m    Wt Readings from Last 3 Encounters:  03/31/20 170 lb 12.8 oz (77.5 kg)  03/23/20 172 lb 6.4 oz (78.2 kg)  03/15/20 172 lb 4.8 oz (78.2 kg)    General: Alert, oriented, no distress.  Skin: normal turgor, no rashes, warm and dry HEENT:  Normocephalic, atraumatic. Pupils equal round and reactive to light; sclera anicteric; extraocular muscles intact; Fundi ** Nose without nasal septal hypertrophy Mouth/Parynx benign; Mallinpatti scale Neck: No JVD, no carotid bruits; normal carotid upstroke Lungs: clear to ausculatation and percussion; no wheezing or rales Chest wall: without tenderness to palpitation Heart: PMI not displaced, RRR, s1 s2 normal, 1/6 systolic murmur, no diastolic murmur, no rubs, gallops, thrills, or heaves Abdomen: soft, nontender; no hepatosplenomehaly, BS+; abdominal aorta nontender and not dilated by palpation. Back: no CVA tenderness Pulses 2+ Musculoskeletal: full range of motion, normal strength, no joint deformities Extremities: no clubbing cyanosis or edema, Homan's sign negative  Neurologic: grossly nonfocal; Cranial nerves grossly wnl Psychologic: Normal mood and affect   Studies/Labs Reviewed:   EKG:  EKG not ordered today.  I personally reviewed the ECG from October 26, 2019 which showed sinus bradycardia at 54 bpm, LVH with repolarization abnormality.  QTc interval was 398 ms.  Recent Labs: BMP Latest Ref Rng & Units 03/24/2020 03/15/2020 10/26/2019  Glucose 70 - 99 mg/dL 133(H) 85 93  BUN 8 - 23 mg/dL 23 30(H) 30(H)  Creatinine 0.44 - 1.00 mg/dL 1.66(H) 1.26(H) 0.93  BUN/Creat Ratio 12 - 28 - - 32(H)  Sodium 135 - 145 mmol/L 136 139 136  Potassium  3.5 - 5.1 mmol/L 4.7 5.1 5.2  Chloride 98 - 111 mmol/L 105 104 102  CO2 22 - 32 mmol/L '22 27 23  ' Calcium 8.9 - 10.3 mg/dL 9.8 10.6(H) 10.7(H)     Hepatic Function Latest Ref Rng & Units 12/02/2019 02/25/2019 11/19/2018  Total Protein 6.0 - 8.3 g/dL 6.8 7.5 7.0  Albumin 3.5 - 5.2 g/dL 4.2 4.4 4.2  AST 0 - 37 U/L 29 52(H) 23  ALT 0 - 35 U/L '24 29 18  ' Alk Phosphatase 39 - 117 U/L 68 78 71  Total Bilirubin 0.2 - 1.2 mg/dL 0.4 1.0 0.5  Bilirubin, Direct 0.0 - 0.3 mg/dL 0.1 - 0.1    CBC Latest Ref Rng & Units 03/24/2020 03/15/2020 10/26/2019    WBC 4.0 - 10.5 K/uL 13.4(H) 8.1 9.2  Hemoglobin 12.0 - 15.0 g/dL 12.9 13.7 13.7  Hematocrit 36.0 - 46.0 % 38.6 43.0 40.4  Platelets 150 - 400 K/uL 163 201 234   Lab Results  Component Value Date   MCV 89.4 03/24/2020   MCV 91.9 03/15/2020   MCV 89 10/26/2019   Lab Results  Component Value Date   TSH 0.994 10/26/2019   Lab Results  Component Value Date   HGBA1C 5.6 02/25/2019     BNP    Component Value Date/Time   BNP 703.5 (H) 02/25/2019 1122    ProBNP No results found for: PROBNP   Lipid Panel     Component Value Date/Time   CHOL 129 12/02/2019 1541   TRIG 80.0 12/02/2019 1541   HDL 60.10 12/02/2019 1541   CHOLHDL 2 12/02/2019 1541   VLDL 16.0 12/02/2019 1541   LDLCALC 53 12/02/2019 1541     RADIOLOGY: DG Shoulder Right Port  Result Date: 03/23/2020 CLINICAL DATA:  Postop shoulder arthroplasty. EXAM: PORTABLE RIGHT SHOULDER COMPARISON:  CT 02/01/2020 FINDINGS: Examination demonstrates right shoulder arthroplasty with glenoid and humeral components intact and normally located. Recommend correlation with findings at the time of the procedure. IMPRESSION: Expected changes post right shoulder arthroplasty. Electronically Signed   By: Marin Olp M.D.   On: 03/23/2020 10:56     Additional studies/ records that were reviewed today include:   Sleep Study IMPRESSIONS: 10/07/2018 - Severe obstructive sleep apnea occurred during the diagnostic portion of the study (AHI 36.9/h; RDI 45.1/h); events were worse with supine position (AHI 46.8/h) and during REM sleep (AHI 52.2/h). CPAP was initiated at 5 cm and was titrated to optimal PAP pressure of 11 cm of water. - No significant central sleep apnea occurred during the diagnostic portion of the study (CAI = 0.0/hour). - Severe oxygen desaturation during the diagnostic portion of the study to a nadir of 77%. - The patient snored with loud snoring volume during the diagnostic portion of the study. - No cardiac  abnormalities were noted during this study. - Clinically significant periodic limb movements did not occur during sleep.  DIAGNOSIS - Obstructive Sleep Apnea (327.23 [G47.33 ICD-10])  RECOMMENDATIONS - Trial of CPAP therapy with EPR at 11 cm H2O with heated humidification. A Medium size Resmed Full Face Mask AirFit 20 for Her mask was used for the titration.  - Effort should be made to optimize nasal and oropharyngeal patency. - Avoid alcohol, sedatives and other CNS depressants that may worsen sleep apnea and disrupt normal sleep architecture. - Sleep hygiene should be reviewed to assess factors that may improve sleep quality. - Weight management and regular exercise should be initiated or continued. - Recommend a download in 51  days and sleep clinic evaluation after 4 weeks of therapy.  [Electronically signed] 10/18/2018 04:25 PM  Cath: 02/26/19   Prox RCA to Mid RCA lesion is 15% stenosed.  Ost LAD to Prox LAD lesion is 20% stenosed.  Prox Cx lesion is 20% stenosed.  Mild non-obstructive coronary artery disease with a patent proximal LAD stent with mild 20% intimal hyperplasia (inserted 09/1999); 20% proximal left circumflex narrowing, and mild irregularity of 10 to 15% in the mid RCA.  Hyperdynamic LV function with an "Ace of Spade "configuration and near cavity obliteration in the mid to apical segment during systole. There is evidence for left ventricular hypertrophy. LVEDP is 16 mm.  RECOMMENDATION: Medical therapy with optimal blood pressure control, lipid management, and resumption of Eliquis tomorrow    ASSESSMENT:    1. Medication management   2. Coronary artery disease involving native coronary artery of native heart without angina pectoris   3. Chronic diastolic CHF (congestive heart failure) ([place])   4. OSA (obstructive sleep apnea)   5. Paroxysmal atrial fibrillation ([facility])   6. Anticoagulated   7. Essential hypertension      PLAN:  1.  CAD:  Cardiac catheterization from February 26, 2020 was reviewed.  LAD stent was patent.  She had mild nonobstructive CAD involving her circumflex and RCA.  She is not having any recurrent anginal symptomatology.  2.  Exertional dyspnea: Probably contributed by LVH.  She has a "spade-like ventricle with near cavity obliteration in the mid to apical segment of her myocardium during systole.  There is evidence for diastolic dysfunction.  3. Obstructive sleep apnea: I again reviewed her diagnostic polysomnogram and CPAP titration.  She had severe sleep apnea with an AHI of 37/h and with REM sleep 52/h.  She had significant oxygen desaturation to a nadir of 77%.  Since her shoulder surgery she has been wearing a splint which has made it difficult to put a mask on her head with only one arm.  As result she has not been able to use CPAP since her surgery.  Later this week she will undergo repeat orthopedic evaluation.  I recommended resumption of CPAP therapy as soon as possible particularly with the severity of her syndrome and marked oxygen desaturation untreated.  4.  PAF: Maintaining sinus rhythm on Tikosyn.  5.  Eliquis anticoagulation: Tolerating well without bleeding.  6.  Essential hypertension: Blood pressure today by me was 126/62.  She continues to be on candesartan 16 mg daily, and spironolactone 25 mg daily.  7.  Hyperlipidemia: Most recent lipid study December 02, 2019 showed an LDL 53 on rosuvastatin 40 mg daily.  Medication Adjustments/Labs and Tests Ordered: Current medicines are reviewed at length with the patient today.  Concerns regarding medicines are outlined above.  Medication changes, Labs and Tests ordered today are listed in the Patient Instructions below. Patient Instructions  Medication Instructions:  CONTINUE WITH CURRENT MEDICATIONS. NO CHANGES.  *If you need a refill on your cardiac medications before your next appointment, please call your pharmacy*   Lab Work: [facility], [place]  If you have labs (blood work) drawn today and your tests are completely normal, you will receive your results only by: Marland Kitchen MyChart Message (if you have MyChart) OR . A paper copy in the mail If you have any lab test that is abnormal or we need to change your treatment, we will call you to review the results.   Follow-Up: At [facility], you and your health needs  are our priority.  As part of our continuing mission to provide you with exceptional heart care, we have created designated Provider Care Teams.  These Care Teams include your primary Cardiologist (physician) and Advanced Practice Providers (APPs -  Physician Assistants and Nurse Practitioners) who all work together to provide you with the care you need, when you need it.  We recommend signing up for the patient portal called "MyChart".  Sign up information is provided on this After Visit Summary.  MyChart is used to connect with patients for Virtual Visits (Telemedicine).  Patients are able to view lab/test results, encounter notes, upcoming appointments, etc.  Non-urgent messages can be sent to your provider as well.   To learn more about what you can do with MyChart, go to [email or web address].    Your next appointment:   3-4 month(s)  The format for your next appointment:   In Person  Provider:   Shelva Majestic, MD        Signed, Barbara Majestic, MD  04/06/2020 3:51 PM    [facility] [address] [place], [place], [place]  [address] Phone: [phone]

## 2020-04-06 NOTE — Telephone Encounter (Signed)
Pt came into office today for lab work and began complaining of swelling in her ankles. RN assess, ankles bilaterally. No redness or warmth or pitting. Pt denies pain with this swelling and states that it has been happening for 2 days. She reports that it is better in the morning when she wakes but when her feet hit the floor when she gets out of bed the swelling returns.  Told pt to elevate her legs and to try compression stockings. Advised that pt keep track of her weight daily and to call us with wt gain of 3lbs in one day and 5lbs in one week as well as monitoring her salt intake.  Pt verbalized understanding and states she will stop drinking so much water. She states her BP this morning was 197 but she took her medications and rechecked it after and it was down to 154.   Notified that I would send this message to [facility] and if her swelling got worse to call our office. Pt verbalized understanding with no other questions at this time.

## 2020-04-07 LAB — CBC
Hematocrit: 35.3 % (ref 34.0–46.6)
Hemoglobin: 11.8 g/dL (ref 11.1–15.9)
MCH: 29.5 pg (ref 26.6–33.0)
MCHC: 33.4 g/dL (ref 31.5–35.7)
MCV: 88 fL (ref 79–97)
Platelets: 253 10*3/uL (ref 150–450)
RBC: 4 x10E6/uL (ref 3.77–5.28)
RDW: 12.8 % (ref 11.7–15.4)
WBC: 6.3 10*3/uL (ref 3.4–10.8)

## 2020-04-07 LAB — BASIC METABOLIC PANEL
BUN/Creatinine Ratio: 12 (ref 12–28)
BUN: 16 mg/dL (ref 8–27)
CO2: 23 mmol/L (ref 20–29)
Calcium: 10.1 mg/dL (ref 8.7–10.3)
Chloride: 103 mmol/L (ref 96–106)
Creatinine, Ser: 1.38 mg/dL — ABNORMAL HIGH (ref 0.57–1.00)
GFR calc Af Amer: 41 mL/min/{1.73_m2} — ABNORMAL LOW (ref >59)
GFR calc non Af Amer: 36 mL/min/{1.73_m2} — ABNORMAL LOW (ref >59)
Glucose: 82 mg/dL (ref 65–99)
Potassium: 4.5 mmol/L (ref 3.5–5.2)
Sodium: 142 mmol/L (ref 134–144)

## 2020-04-07 NOTE — Telephone Encounter (Signed)
agree

## 2020-05-02 ENCOUNTER — Encounter: Payer: Self-pay | Admitting: Podiatry

## 2020-05-02 ENCOUNTER — Ambulatory Visit (INDEPENDENT_AMBULATORY_CARE_PROVIDER_SITE_OTHER): Payer: Medicare Other | Admitting: Podiatry

## 2020-05-02 ENCOUNTER — Other Ambulatory Visit: Payer: Self-pay

## 2020-05-02 DIAGNOSIS — M79675 Pain in left toe(s): Secondary | ICD-10-CM | POA: Diagnosis not present

## 2020-05-02 DIAGNOSIS — B351 Tinea unguium: Secondary | ICD-10-CM | POA: Diagnosis not present

## 2020-05-02 DIAGNOSIS — M79674 Pain in right toe(s): Secondary | ICD-10-CM

## 2020-05-06 NOTE — Progress Notes (Signed)
Subjective: Barbara [name] is a pleasant 79 y.o. female patient seen today painful thick toenails that are difficult to trim. Pain interferes with ambulation. Aggravating factors include wearing enclosed shoe gear. Pain is relieved with periodic professional debridement.  Past Medical History:  Diagnosis Date  . A-fib ([place])   . Allergy   . CAD (coronary artery disease)    sees Dr. Shelva Majestic  cardiac stents - 2000  . CHF (congestive heart failure) ([place])   . Colon polyps   . Complication of anesthesia    rash/hives with "caines"  . Dyspnea    02/12/18 " when my heart gets out of rhythm, it has not been out of rhythym- since I have been on Tikosyn (11/2017)  . Dysrhythmia    afib fib  . GERD (gastroesophageal reflux disease)    takes OTC- Omeprazole- prn  . Heart murmur   . History of stress test    show normal perfusion without scar or ischemia, post EF 68%  . Hx of echocardiogram    show an EF 55%-60% range with grade 1 diastolic dysfunction, she had mitral anular calcification with mild MR, moderate LA dilation and mild pulmonary hypertension with a PA estimated pressure of 62mm  . Hyperlipidemia   . Hypertension   . NSTEMI (non-ST elevated myocardial infarction) ([place])   . Osteoarthritis   . Pneumonia    hx of 2015   . PONV (postoperative nausea and vomiting)     Patient Active Problem List   Diagnosis Date Noted  . 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 PAF (paroxysmal atrial fibrillation) ([place]) 02/28/2019  . NSTEMI (non-ST elevated myocardial infarction) ([place]) 02/25/2019  . Pain in left knee 01/06/2018  . Dysphonia 12/29/2017  . 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 CHF (congestive heart failure) ([place]) 11/16/2017  . Gastroesophageal reflux disease 11/06/2017  . Bradycardia 12/27/2015  . Exertional dyspnea 12/23/2014  . Preoperative clearance 12/23/2014  . Hyperlipidemia LDL goal <70 09/13/2014  . Sinus bradycardia 09/13/2014  .  LUMBAGO 05/03/2010  . VERTIGO 01/21/2009  . TEMPOROMANDIBULAR JOINT PAIN 08/31/2008  . Osteoarthritis 05/31/2008  . 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 HYPERLIPIDEMIA 04/06/2007  . Hypertensive disorder 04/06/2007  . Coronary atherosclerosis 04/06/2007  . ALLERGIC RHINITIS 04/06/2007  . Personal history of colonic polyps 04/06/2007    Current Outpatient Medications on File Prior to Visit  Medication Sig Dispense Refill  . apixaban (ELIQUIS) 2.5 MG TABS tablet Take 1 tablet (2.5 mg total) by mouth 2 (two) times daily. Please contact primary care for follow-up 30 tablet 0  . apixaban (ELIQUIS) 5 MG TABS tablet Take 1 tablet (5 mg total) by mouth 2 (two) times daily. 180 tablet 1  . Biotin 5000 MCG SUBL Place 10,000 mcg under the tongue daily.    . candesartan (ATACAND) 16 MG tablet Take 1 tablet (16 mg total) by mouth daily. 90 tablet 3  . Cholecalciferol (VITAMIN D3) 2000 units TABS Take 2,000 Units by mouth daily.    Marland Kitchen dextromethorphan (DELSYM) 30 MG/5ML liquid Take 15 mg by mouth daily as needed for cough.     . dofetilide (TIKOSYN) 125 MCG capsule Take 1 capsule (125 mcg total) by mouth 2 (two) times daily. 180 capsule 1  . HOMEOPATHIC PRODUCTS PO Take 2 tablets by mouth 3 (three) times daily as needed (pain/fatigue). [employer]iLife Pain and Fatigue Relief (Aconitum napellus 30C HPUS Arsenicum album 30C HPUS Belladonna 30C HPUS Coniinum 30C HPUS Gelsemium sempervirens 30CHPUS Hypericum perforatum 3X,  30C HPUS Kali bichromicum 30C Phosphoricum acidum 30CHPUS Rhus tox 30C HPUS Uricum acidum 30C HPUS Lacticum acidum 30C HPUS Lactose, Magnesium Stearate, Microcrystalline Cellulose)    . HYDROcodone-acetaminophen (NORCO) 5-325 MG tablet Take 1 tablet by mouth every 6 (six) hours as needed for moderate pain. 30 tablet 0  . hypromellose (VISTA GEL DRY EYE RELIEF) 0.3 % GEL ophthalmic ointment Place 1 application into both eyes at bedtime.    Marland Kitchen ipratropium (ATROVENT) 0.06 % nasal spray Place 2 sprays  into both nostrils daily as needed for rhinitis.   5  . ketotifen (ALAWAY) 0.025 % ophthalmic solution Place 1 drop into both eyes 2 (two) times daily as needed (eye irritation.).    Marland Kitchen loratadine (CLARITIN) 10 MG tablet Take 10 mg by mouth daily as needed (allergies/sinuses.).     Marland Kitchen methocarbamol (ROBAXIN) 500 MG tablet Take 1 tablet (500 mg total) by mouth every 6 (six) hours as needed for muscle spasms. (Patient not taking: Reported on 03/31/2020) 50 tablet 0  . Multiple Vitamin (MULTIVITAMIN WITH MINERALS) TABS tablet Take 1 tablet by mouth daily.    . naproxen sodium (ALEVE) 220 MG tablet Take 220 mg by mouth 2 (two) times daily as needed (pain.).     Marland Kitchen omeprazole (PRILOSEC OTC) 20 MG tablet Take 20 mg by mouth daily as needed (for acid reflux).     . rosuvastatin (CRESTOR) 40 MG tablet Take 1 tablet (40 mg total) by mouth daily. (Patient taking differently: Take 40 mg by mouth at bedtime. ) 90 tablet 3  . spironolactone (ALDACTONE) 25 MG tablet Take 1 tablet (25 mg total) by mouth daily. 90 tablet 3  . triamcinolone (NASACORT) 55 MCG/ACT nasal inhaler Place 1 spray into both nostrils daily as needed (for allergies).      No current facility-administered medications on file prior to visit.    Allergies  Allergen Reactions  . Lidocaine Anaphylaxis  . Morphine Other (See Comments)    "Body shuts down"  . Procaine Hcl Anaphylaxis, Rash and Other (See Comments)    "Anything with 'caine' in it "  . Sulfonamide Derivatives Hives  . Tramadol Nausea Only  . Vytorin [Ezetimibe-Simvastatin] Other (See Comments)    Unknown  . Tape Itching    Objective: Physical Exam  General: LARUTH HANGER is a pleasant 79 y.o. African American female, in NAD. AAO x 3.   Vascular:  Neurovascular status unchanged b/l lower extremities. Capillary refill time to digits immediate b/l. Palpable pedal pulses b/l LE. Pedal hair sparse. Lower extremity skin temperature gradient within normal limits.  Nonpitting edema noted b/l lower extremities.  Dermatological:  Pedal skin with normal turgor, texture and tone bilaterally. No open wounds bilaterally. No interdigital macerations bilaterally. Toenails 1-5 b/l elongated, discolored, dystrophic, thickened, crumbly with subungual debris and tenderness to dorsal palpation.  Musculoskeletal:  Normal muscle strength 5/5 to all lower extremity muscle groups bilaterally. No pain crepitus or joint limitation noted with ROM b/l. No gross bony deformities bilaterally. Utilizes cane for ambulation assistance.  Neurological:  Protective sensation intact 5/5 intact bilaterally with 10g monofilament b/l. Vibratory sensation intact b/l. Proprioception intact bilaterally.  Assessment and Plan:  1. Pain due to onychomycosis of toenails of both feet    -Examined patient. -No new findings. No new orders. -Toenails 1-5 b/l were debrided in length and girth with sterile nail nippers and dremel without iatrogenic bleeding.  -Patient to continue soft, supportive shoe gear daily. -Patient/POA to call should there be question/concern in the  interim.  Return in about 3 months (around 08/02/2020) for nail trim/ Eliquis.  Marzetta Board, DPM

## 2020-05-15 ENCOUNTER — Other Ambulatory Visit: Payer: Self-pay | Admitting: Cardiovascular Disease

## 2020-05-15 MED ORDER — DOFETILIDE 125 MCG PO CAPS: 125.0000 ug | ORAL_CAPSULE | Freq: Two times a day (BID) | ORAL | 1 refills | Status: DC

## 2020-05-17 ENCOUNTER — Other Ambulatory Visit: Payer: Self-pay

## 2020-05-17 ENCOUNTER — Telehealth: Payer: Self-pay | Admitting: Cardiovascular Disease

## 2020-05-17 ENCOUNTER — Observation Stay (HOSPITAL_COMMUNITY)
Admission: EM | Admit: 2020-05-17 | Discharge: 2020-05-20 | Disposition: A | Discharge status: Home / Self Care | Payer: Medicare Other | Attending: Cardiology | Admitting: Cardiology

## 2020-05-17 ENCOUNTER — Encounter (HOSPITAL_COMMUNITY): Payer: Self-pay | Admitting: Emergency Medicine

## 2020-05-17 ENCOUNTER — Emergency Department (HOSPITAL_COMMUNITY): Payer: Medicare Other

## 2020-05-17 DIAGNOSIS — I214 Non-ST elevation (NSTEMI) myocardial infarction: Secondary | ICD-10-CM | POA: Diagnosis not present

## 2020-05-17 DIAGNOSIS — Z79899 Other long term (current) drug therapy: Secondary | ICD-10-CM | POA: Insufficient documentation

## 2020-05-17 DIAGNOSIS — R079 Chest pain, unspecified: Secondary | ICD-10-CM | POA: Diagnosis not present

## 2020-05-17 DIAGNOSIS — I2489 Other forms of acute ischemic heart disease: Secondary | ICD-10-CM | POA: Diagnosis present

## 2020-05-17 DIAGNOSIS — I2510 Atherosclerotic heart disease of native coronary artery without angina pectoris: Secondary | ICD-10-CM | POA: Diagnosis present

## 2020-05-17 DIAGNOSIS — I10 Essential (primary) hypertension: Secondary | ICD-10-CM | POA: Diagnosis present

## 2020-05-17 DIAGNOSIS — I110 Hypertensive heart disease with heart failure: Secondary | ICD-10-CM | POA: Insufficient documentation

## 2020-05-17 DIAGNOSIS — N1832 Chronic kidney disease, stage 3b: Secondary | ICD-10-CM

## 2020-05-17 DIAGNOSIS — Z96611 Presence of right artificial shoulder joint: Secondary | ICD-10-CM | POA: Insufficient documentation

## 2020-05-17 DIAGNOSIS — I509 Heart failure, unspecified: Secondary | ICD-10-CM | POA: Diagnosis not present

## 2020-05-17 DIAGNOSIS — Z20822 Contact with and (suspected) exposure to covid-19: Secondary | ICD-10-CM | POA: Insufficient documentation

## 2020-05-17 DIAGNOSIS — Z87891 Personal history of nicotine dependence: Secondary | ICD-10-CM | POA: Diagnosis not present

## 2020-05-17 DIAGNOSIS — R778 Other specified abnormalities of plasma proteins: Secondary | ICD-10-CM | POA: Diagnosis not present

## 2020-05-17 DIAGNOSIS — N183 Chronic kidney disease, stage 3 unspecified: Secondary | ICD-10-CM

## 2020-05-17 DIAGNOSIS — I248 Other forms of acute ischemic heart disease: Secondary | ICD-10-CM | POA: Diagnosis present

## 2020-05-17 DIAGNOSIS — E7800 Pure hypercholesterolemia, unspecified: Secondary | ICD-10-CM

## 2020-05-17 DIAGNOSIS — I422 Other hypertrophic cardiomyopathy: Secondary | ICD-10-CM

## 2020-05-17 DIAGNOSIS — I2583 Coronary atherosclerosis due to lipid rich plaque: Secondary | ICD-10-CM

## 2020-05-17 DIAGNOSIS — I480 Paroxysmal atrial fibrillation: Secondary | ICD-10-CM | POA: Diagnosis present

## 2020-05-17 DIAGNOSIS — E785 Hyperlipidemia, unspecified: Secondary | ICD-10-CM | POA: Diagnosis present

## 2020-05-17 LAB — CBC
HCT: 42.4 % (ref 36.0–46.0)
Hemoglobin: 13.4 g/dL (ref 12.0–15.0)
MCH: 29.6 pg (ref 26.0–34.0)
MCHC: 31.6 g/dL (ref 30.0–36.0)
MCV: 93.8 fL (ref 80.0–100.0)
Platelets: 226 10*3/uL (ref 150–400)
RBC: 4.52 MIL/uL (ref 3.87–5.11)
RDW: 13 % (ref 11.5–15.5)
WBC: 6.1 10*3/uL (ref 4.0–10.5)
nRBC: 0 % (ref 0.0–0.2)

## 2020-05-17 LAB — BASIC METABOLIC PANEL
Anion gap: 11 (ref 5–15)
Anion gap: 9 (ref 5–15)
BUN: 20 mg/dL (ref 8–23)
BUN: 20 mg/dL (ref 8–23)
CO2: 21 mmol/L — ABNORMAL LOW (ref 22–32)
CO2: 25 mmol/L (ref 22–32)
Calcium: 10.6 mg/dL — ABNORMAL HIGH (ref 8.9–10.3)
Calcium: 10.3 mg/dL (ref 8.9–10.3)
Chloride: 107 mmol/L (ref 98–111)
Chloride: 105 mmol/L (ref 98–111)
Creatinine, Ser: 1.26 mg/dL — ABNORMAL HIGH (ref 0.44–1.00)
Creatinine, Ser: 1.32 mg/dL — ABNORMAL HIGH (ref 0.44–1.00)
GFR calc Af Amer: 46 mL/min — ABNORMAL LOW (ref >60)
GFR calc Af Amer: 44 mL/min — ABNORMAL LOW (ref >60)
GFR calc non Af Amer: 40 mL/min — ABNORMAL LOW (ref >60)
GFR calc non Af Amer: 38 mL/min — ABNORMAL LOW (ref >60)
Glucose, Bld: 149 mg/dL — ABNORMAL HIGH (ref 70–99)
Glucose, Bld: 94 mg/dL (ref 70–99)
Potassium: 4.2 mmol/L (ref 3.5–5.1)
Potassium: 4.4 mmol/L (ref 3.5–5.1)
Sodium: 139 mmol/L (ref 135–145)
Sodium: 139 mmol/L (ref 135–145)

## 2020-05-17 LAB — TROPONIN I (HIGH SENSITIVITY)
Troponin I (High Sensitivity): 510 ng/L (ref <18)
Troponin I (High Sensitivity): 581 ng/L (ref <18)
Troponin I (High Sensitivity): 543 ng/L (ref <18)
Troponin I (High Sensitivity): 533 ng/L (ref <18)

## 2020-05-17 LAB — MAGNESIUM: Magnesium: 2 mg/dL (ref 1.7–2.4)

## 2020-05-17 LAB — SARS CORONAVIRUS 2 BY RT PCR (HOSPITAL ORDER, PERFORMED IN ~~LOC~~ HOSPITAL LAB): SARS Coronavirus 2: NEGATIVE

## 2020-05-17 MED ORDER — ASPIRIN 81 MG PO CHEW
324.0000 mg | CHEWABLE_TABLET | Freq: Once | ORAL | Status: AC
  Administered 2020-05-17: 324 mg via ORAL
  Filled 2020-05-17: qty 4

## 2020-05-17 MED ORDER — DOFETILIDE 125 MCG PO CAPS
125.0000 ug | ORAL_CAPSULE | Freq: Two times a day (BID) | ORAL | Status: DC
  Administered 2020-05-17 – 2020-05-20 (×6): 125 ug via ORAL
  Filled 2020-05-17 (×9): qty 1

## 2020-05-17 MED ORDER — ROSUVASTATIN CALCIUM 20 MG PO TABS
40.0000 mg | ORAL_TABLET | Freq: Every day | ORAL | Status: DC
  Administered 2020-05-17 – 2020-05-19 (×3): 40 mg via ORAL
  Filled 2020-05-17 (×3): qty 2

## 2020-05-17 MED ORDER — HEPARIN (PORCINE) 25000 UT/250ML-% IV SOLN
900.0000 [IU]/h | INTRAVENOUS | Status: DC
  Administered 2020-05-17: 900 [IU]/h via INTRAVENOUS
  Filled 2020-05-17: qty 250

## 2020-05-17 MED ORDER — NITROGLYCERIN 0.4 MG SL SUBL: 0.4000 mg | SUBLINGUAL_TABLET | SUBLINGUAL | Status: DC | PRN

## 2020-05-17 MED ORDER — PANTOPRAZOLE SODIUM 40 MG PO TBEC: 40.0000 mg | DELAYED_RELEASE_TABLET | Freq: Every day | ORAL | Status: DC | PRN

## 2020-05-17 MED ORDER — ONDANSETRON HCL 4 MG/2ML IJ SOLN: 4.0000 mg | Freq: Four times a day (QID) | INTRAMUSCULAR | Status: DC | PRN

## 2020-05-17 MED ORDER — IRBESARTAN 150 MG PO TABS
150.0000 mg | ORAL_TABLET | Freq: Every day | ORAL | Status: DC
  Administered 2020-05-17 – 2020-05-20 (×4): 150 mg via ORAL
  Filled 2020-05-17 (×4): qty 1

## 2020-05-17 MED ORDER — SPIRONOLACTONE 25 MG PO TABS
25.0000 mg | ORAL_TABLET | Freq: Every day | ORAL | Status: DC
  Administered 2020-05-18 – 2020-05-20 (×3): 25 mg via ORAL
  Filled 2020-05-17 (×4): qty 1

## 2020-05-17 MED ORDER — APIXABAN 5 MG PO TABS
5.0000 mg | ORAL_TABLET | Freq: Two times a day (BID) | ORAL | Status: DC
  Administered 2020-05-17 – 2020-05-20 (×6): 5 mg via ORAL
  Filled 2020-05-17 (×7): qty 1

## 2020-05-17 MED ORDER — SODIUM CHLORIDE 0.9% FLUSH
3.0000 mL | Freq: Once | INTRAVENOUS | Status: AC
  Administered 2020-05-17: 3 mL via INTRAVENOUS

## 2020-05-17 MED ORDER — ASPIRIN EC 81 MG PO TBEC
81.0000 mg | DELAYED_RELEASE_TABLET | Freq: Every day | ORAL | Status: DC
  Administered 2020-05-18 – 2020-05-20 (×3): 81 mg via ORAL
  Filled 2020-05-17 (×3): qty 1

## 2020-05-17 NOTE — ED Triage Notes (Signed)
Pt reports chest pain and sob that began last night, also endorses some palpitations. Hx of afib. Pt a/ox4, resp e/u, nad.

## 2020-05-17 NOTE — H&P (Signed)
Cardiology Admission History and Physical:   Patient ID: Barbara [name] MRN: [id number]; DOB: 1940/11/16   Admission date: 05/17/2020  Primary Care Provider: Laurey Morale, MD [facility] Cardiologist: Shelva Majestic, MD  [place] Electrophysiologist:  None   Chief Complaint:  Chest pain  Patient Profile:   Barbara [name] is a 79 y.o. female with past medical history of CAD, PAF on Eliquis, HTN, HLD, history of asymptomatic bradycardia, and severe obstructive sleep apnea on CPAP presented with chest pain and elevated troponin.  History of Present Illness:   Barbara [name] is 79 year old female with past medical history of CAD, PAF on Eliquis, HTN, HLD, history of asymptomatic bradycardia, and severe obstructive sleep apnea on CPAP.  Patient had remote bare-metal stenting of proximal LAD in November 2000.  Last echocardiogram obtained on 11/17/2017 showed EF 70 to 75%, dynamic obstruction at rest in the outflow tract, mild to moderate MR, PA peak pressure 53 mmHg.  Patient was admitted with NSTEMI in April 2020, troponin at the time went up to 1.7 which correspond to 1700 High Sensitivity Troponin.  Patient underwent a cardiac catheterization on 02/26/2019 which showed patent proximal LAD stent with 20% intimal hyperplasia, 20% proximal left circumflex stenosis, mild 10 to 15% disease in the mid RCA, hyperdynamic LV function with a soft space configuration with near cavity obliteration in the mid to apical segment during systole, LVEDP 16 mmHg.  Since then, patient has been doing well.  She underwent shoulder surgery on 03/23/2020 by Dr. Victorino December.  She has been having trouble lifting her right arm since.  Patient was in her usual state of health until 5:30 PM when afternoon of 05/16/2020 when she started having heaviness in the substernal area radiating into her throat.  The pain was currently accompanied with shortness of breath however no significant palpitation.  The  symptom was similar to what she has experienced in April of last year.  The pain did not go away by the time she went to sleep around 11:30 PM.  However by the time she woke up, the pain did ease off.  She called cardiology service who recommended her to go to the ED.  On arrival, her blood pressure was mildly elevated in the 160s to 170s range.  High-sensitivity troponin was elevated however relatively flat at 510 --> 581.  Hemoglobin was normal.  Creatinine was 1.26 which was her baseline.  Chest x-ray showed no acute process.  EKG showed sinus rhythm with LVH and repolarization abnormality.  Cardiology was consulted for chest pain with elevated troponin.   Past Medical History:  Diagnosis Date  . A-fib ([place])   . Allergy   . CAD (coronary artery disease)    sees Dr. Shelva Majestic  cardiac stents - 2000  . CHF (congestive heart failure) ([place])   . Colon polyps   . Complication of anesthesia    rash/hives with "caines"  . Dyspnea    02/12/18 " when my heart gets out of rhythm, it has not been out of rhythym- since I have been on Tikosyn (11/2017)  . Dysrhythmia    afib fib  . GERD (gastroesophageal reflux disease)    takes OTC- Omeprazole- prn  . Heart murmur   . History of stress test    show normal perfusion without scar or ischemia, post EF 68%  . Hx of echocardiogram    show an EF 55%-60% range with grade 1 diastolic dysfunction, she had mitral anular calcification with mild MR,  moderate LA dilation and mild pulmonary hypertension with a PA estimated pressure of 56mm  . Hyperlipidemia   . Hypertension   . NSTEMI (non-ST elevated myocardial infarction) ([place])   . Osteoarthritis   . Pneumonia    hx of 2015   . PONV (postoperative nausea and vomiting)     Past Surgical History:  Procedure Laterality Date  . CARDIAC CATHETERIZATION     11/2017  . cardiac stents  2000  . COLONOSCOPY  01-05-14   per Dr. Teena Irani, clear, no repeats needed   . COLONOSCOPY    . CORONARY STENT PLACEMENT   2000   in LAD  . DIRECT LARYNGOSCOPY WITH RADIAESSE INJECTION N/A 02/13/2018   Procedure: DIRECT LARYNGOSCOPY WITH RADIAESSE INJECTION;  Surgeon: Melida Quitter, MD;  Location: [place];  Service: ENT;  Laterality: N/A;  . EYE SURGERY Left    CATARACT REMOVAL  . KNEE ARTHROSCOPY Left 01/06/2015   Procedure: LEFT KNEE ARTHROSCOPY, abrasion chondroplasty of the medial femerol condryl,medial and lateral menisectomy, microfracture , synovectomy of the suprpatellar pouch;  Surgeon: Latanya Maudlin, MD;  Location: [facility];  Service: Orthopedics;  Laterality: Left;  . LEFT HEART CATH AND CORONARY ANGIOGRAPHY N/A 02/26/2019   Procedure: LEFT HEART CATH AND CORONARY ANGIOGRAPHY;  Surgeon: Troy Sine, MD;  Location: [place] [facility];  Service: Cardiovascular;  Laterality: N/A;  . MICROLARYNGOSCOPY W/VOCAL CORD INJECTION N/A 08/07/2018   Procedure: MICROLARYNGOSCOPY WITH VOCAL CORD INJECTION OF PROLARYN;  Surgeon: Melida Quitter, MD;  Location: [place];  Service: ENT;  Laterality: N/A;  JET VENTILATION  . REVERSE SHOULDER ARTHROPLASTY Right 03/23/2020   Procedure: REVERSE SHOULDER ARTHROPLASTY;  Surgeon: Nicholes Stairs, MD;  Location: [place];  Service: Orthopedics;  Laterality: Right;  Marland Kitchen VAGINAL HYSTERECTOMY  1971     Medications Prior to Admission: Prior to Admission medications   Medication Sig Start Date End Date Taking? Authorizing Provider  acetaminophen (TYLENOL) 500 MG tablet Take 1,000 mg by mouth every 6 (six) hours as needed for mild pain or headache.   Yes [provider]  apixaban (ELIQUIS) 5 MG TABS tablet Take 1 tablet (5 mg total) by mouth 2 (two) times daily. 12/23/19  Yes Darreld Mclean, PA-C  Biotin 5000 MCG SUBL Place 10,000 mcg under the tongue daily.   Yes [provider]  candesartan (ATACAND) 16 MG tablet Take 1 tablet (16 mg total) by mouth daily. 02/25/20  Yes Troy Sine, MD  Cholecalciferol (VITAMIN D3) 2000 units TABS Take 2,000 Units by mouth daily.    Yes [provider]  dextromethorphan (DELSYM) 30 MG/5ML liquid Take 15 mg by mouth daily as needed for cough.    Yes [provider]  dofetilide (TIKOSYN) 125 MCG capsule Take 1 capsule (125 mcg total) by mouth 2 (two) times daily. 05/15/20  Yes Troy Sine, MD  HOMEOPATHIC PRODUCTS PO Place 2 tablets under the tongue See admin instructions. [employer] Pain and Fatigue Relief (Aconitum napellus 30C HPUS Arsenicum album 30C HPUS Belladonna 30C HPUS Coniinum 30C HPUS Gelsemium sempervirens 30CHPUS Hypericum perforatum 3X, 30C HPUS Kali bichromicum 30C Phosphoricum acidum 30CHPUS Rhus tox 30C HPUS Uricum acidum 30C HPUS Lacticum acidum 30C HPUS Lactose, Magnesium Stearate, Microcrystalline Cellulose)- Dissolve 2 tablets under the tongue three times a day   Yes [provider]  hypromellose (VISTA GEL DRY EYE RELIEF) 0.3 % GEL ophthalmic ointment Place 1 application into both eyes at bedtime.   Yes [provider]  ipratropium (ATROVENT) 0.06 %  nasal spray Place 2 sprays into both nostrils daily as needed for rhinitis.  04/28/18  Yes [provider]  ketotifen (ALAWAY) 0.025 % ophthalmic solution Place 1 drop into both eyes 2 (two) times daily as needed (eye irritation.).   Yes [provider]  loratadine (CLARITIN) 10 MG tablet Take 10 mg by mouth daily as needed (allergies/sinus issues).    Yes [provider]  Multiple Vitamin (MULTIVITAMIN WITH MINERALS) TABS tablet Take 1 tablet by mouth daily.   Yes [provider]  omeprazole (PRILOSEC OTC) 20 MG tablet Take 20 mg by mouth daily as needed (for acid reflux).    Yes [provider]  PRESCRIPTION MEDICATION See admin instructions. CPAP- At bedtime   Yes [provider]  rosuvastatin (CRESTOR) 40 MG tablet Take 1 tablet (40 mg total) by mouth daily. Patient taking differently: Take 40 mg by mouth at bedtime.  02/25/20  Yes Troy Sine, MD  spironolactone  (ALDACTONE) 25 MG tablet Take 1 tablet (25 mg total) by mouth daily. 02/25/20  Yes Troy Sine, MD  triamcinolone (NASACORT) 55 MCG/ACT nasal inhaler Place 1 spray into both nostrils daily as needed (for allergies).  10/17/12  Yes Laurey Morale, MD  HYDROcodone-acetaminophen (NORCO) 5-325 MG tablet Take 1 tablet by mouth every 6 (six) hours as needed for moderate pain. Patient not taking: Reported on 05/17/2020 03/24/20   Nicholes Stairs, MD  methocarbamol (ROBAXIN) 500 MG tablet Take 1 tablet (500 mg total) by mouth every 6 (six) hours as needed for muscle spasms. Patient not taking: Reported on 05/17/2020 03/24/20   Nicholes Stairs, MD     Allergies:    Allergies  Allergen Reactions  . Lidocaine Anaphylaxis  . Morphine Other (See Comments)    "Body shuts down"  . Procaine Hcl Anaphylaxis, Rash and Other (See Comments)    "Anything with 'caine' in it "  . Sulfonamide Derivatives Hives  . Norco [Hydrocodone-Acetaminophen] Other (See Comments)    "Made me sick"  . Tramadol Nausea Only  . Vytorin [Ezetimibe-Simvastatin] Other (See Comments)    Unknown  . Tape Itching and Other (See Comments)    Patient prefers either paper tape or Coban wrap    Social History:   Social History   Socioeconomic History  . Marital status: Married    Spouse name: Not on file  . Number of children: 2  . Years of education: Not on file  . Highest education level: Not on file  Occupational History  . Not on file  Tobacco Use  . Smoking status: Former Smoker    Years: 40.00    Quit date: 11/04/1996    Years since quitting: 23.5  . Smokeless tobacco: Never Used  Vaping Use  . Vaping Use: Never used  Substance and Sexual Activity  . Alcohol use: No    Alcohol/week: 0.0 standard drinks  . Drug use: No  . Sexual activity: Not on file  Other Topics Concern  . Not on file  Social History Narrative  . Not on file   Social Determinants of Health   Financial Resource Strain:   .  Difficulty of Paying Living Expenses:   Food Insecurity:   . Worried About Charity fundraiser in the Last Year:   . Arboriculturist in the Last Year:   Transportation Needs:   . Film/video editor (Medical):   Marland Kitchen Lack of Transportation (Non-Medical):   Physical Activity:   . Days  of Exercise per Week:   . Minutes of Exercise per Session:   Stress:   . Feeling of Stress :   Social Connections:   . Frequency of Communication with Friends and Family:   . Frequency of Social Gatherings with Friends and Family:   . Attends Religious Services:   . Active Member of Clubs or Organizations:   . Attends Archivist Meetings:   Marland Kitchen Marital Status:   Intimate Partner Violence:   . Fear of Current or Ex-Partner:   . Emotionally Abused:   Marland Kitchen Physically Abused:   . Sexually Abused:     Family History:   The patient's family history includes Cancer in her maternal grandmother; Heart disease in her maternal grandfather.    ROS:  Please see the history of present illness.  All other ROS reviewed and negative.     Physical Exam/Data:   Vitals:   05/17/20 1518 05/17/20 1530 05/17/20 1545 05/17/20 1704  BP: (!) 171/79 (!) 161/79  (!) 157/76  Pulse: (!) 56 63  60  Resp: 12 20  15   Temp:      SpO2: 100% 100%  100%  Weight:   74.8 kg   Height:   5\' 8"  (1.727 m)    No intake or output data in the 24 hours ending 05/17/20 1728 Last 3 Weights 05/17/2020 03/31/2020 03/23/2020  Weight (lbs) 165 lb 170 lb 12.8 oz 172 lb 6.4 oz  Weight (kg) 74.844 kg 77.474 kg 78.2 kg     Body mass index is 25.09 kg/m.  General:  Well nourished, well developed, in no acute distress HEENT: normal Lymph: no adenopathy Neck: no JVD Endocrine:  No thryomegaly Vascular: No carotid bruits; FA pulses 2+ bilaterally without bruits  Cardiac:  normal S1, S2; RRR; no murmur  Lungs:  clear to auscultation bilaterally, no wheezing, rhonchi or rales  Abd: soft, nontender, no hepatomegaly  Ext: no  edema Musculoskeletal:  No deformities, BUE and BLE strength normal and equal Skin: warm and dry  Neuro:  CNs 2-12 intact, no focal abnormalities noted Psych:  Normal affect    EKG:  The ECG that was done and was personally reviewed and demonstrates normal sinus rhythm with LVH and repolarization abnormalities.  Relevant CV Studies:  Cath 02/26/2019  Prox RCA to Mid RCA lesion is 15% stenosed.  Ost LAD to Prox LAD lesion is 20% stenosed.  Prox Cx lesion is 20% stenosed.   Mild non-obstructive coronary artery disease with a patent proximal LAD stent with mild 20% intimal hyperplasia (inserted  09/1999); 20% proximal left circumflex narrowing, and mild irregularity of 10 to 15% in the mid RCA.  Hyperdynamic LV function with an "Ace of Spade "configuration and near cavity obliteration in the mid to apical segment during systole.  There is evidence for left ventricular hypertrophy.  LVEDP is 16 mm.  RECOMMENDATION: Medical therapy with optimal blood pressure control, lipid management, and resumption of Eliquis tomorrow.   Laboratory Data:  High Sensitivity Troponin:   Recent Labs  Lab 05/17/20 1209 05/17/20 1513  TROPONINIHS 510* 581*      Chemistry Recent Labs  Lab 05/17/20 1209  NA 139  K 4.2  CL 107  CO2 21*  GLUCOSE 149*  BUN 20  CREATININE 1.26*  CALCIUM 10.6*  GFRNONAA 40*  GFRAA 46*  ANIONGAP 11    No results for input(s): PROT, ALBUMIN, AST, ALT, ALKPHOS, BILITOT in the last 168 hours. Hematology Recent Labs  Lab 05/17/20 1209  WBC 6.1  RBC 4.52  HGB 13.4  HCT 42.4  MCV 93.8  MCH 29.6  MCHC 31.6  RDW 13.0  PLT 226   BNPNo results for input(s): BNP, PROBNP in the last 168 hours.  DDimer No results for input(s): DDIMER in the last 168 hours.   Radiology/Studies:  DG Chest 2 View  Result Date: 05/17/2020 CLINICAL DATA:  Chest pain and shortness of breath. EXAM: CHEST - 2 VIEW COMPARISON:  Chest x-ray dated February 25, 2019. FINDINGS: Stable  cardiomediastinal silhouette. Normal pulmonary vascularity. No focal consolidation, pleural effusion, or pneumothorax. No acute osseous abnormality. Interval right reverse total shoulder arthroplasty. IMPRESSION: 1. No acute cardiopulmonary disease. Electronically Signed   By: Titus Dubin M.D.   On: 05/17/2020 12:47       TIMI Risk Score for Unstable Angina or Non-ST Elevation MI:   The patient's TIMI risk score is 4, which indicates a 20% risk of all cause mortality, new or recurrent myocardial infarction or need for urgent revascularization in the next 14 days.      Assessment and Plan:   1. Chest pain with elevated troponin:  -Prolonged episode of chest pain that lasted 6 hours yesterday afternoon.  Patient had a cardiac catheterization in Apr 2020 that showed patent proximal LAD stent with minimal disease.  -Plan to obtain echocardiogram early morning, if ejection fraction is normal, then we plan to proceed with Myoview tomorrow.  I have verified with nuc med department that they can do the Myoview even if she cannot resolve her right arm.  -Previous work-up seems to suggest possibility of hypertrophic cardiomyopathy, question if combined with elevated blood pressure can cause her to have chronically elevated troponin.  2. CAD: Remote bare-metal stenting of proximal LAD in November 2000, appears to be patent and last cardiac catheterization given April 2020.  3. PAF on Eliquis: She is maintaining sinus rhythm.  Continue Eliquis and flecainide.  Patient is not on any AV nodal blocking agent at home due to history of relative bradycardia, baseline heart rate in the low 50s to high 40s.  4. Hypertension: Blood pressure elevated in the ED, will resume home blood pressure medications.   5. Hyperlipidemia: On Crestor  6. History of asymptomatic bradycardia  7. History of severe obstructive sleep apnea on CPAP therapy  Severity of Illness: The appropriate patient status for this patient  is OBSERVATION. Observation status is judged to be reasonable and necessary in order to provide the required intensity of service to ensure the patient's safety. The patient's presenting symptoms, physical exam findings, and initial radiographic and laboratory data in the context of their medical condition is felt to place them at decreased risk for further clinical deterioration. Furthermore, it is anticipated that the patient will be medically stable for discharge from the hospital within 2 midnights of admission. The following factors support the patient status of observation.   " The patient's presenting symptoms include chest pain. " The physical exam findings include benign. " The initial radiographic and laboratory data are elevated troponin.     For questions or updates, please contact [place] Please consult [email or web address] for contact info under     Hilbert Corrigan, [place]  05/17/2020 5:28 PM

## 2020-05-17 NOTE — Progress Notes (Signed)
ANTICOAGULATION CONSULT NOTE - Initial Consult  Pharmacy Consult for Heparin Indication: chest pain/ACS  Allergies  Allergen Reactions  . Lidocaine Anaphylaxis  . Morphine Other (See Comments)    "Body shuts down"  . Procaine Hcl Anaphylaxis, Rash and Other (See Comments)    "Anything with 'caine' in it "  . Sulfonamide Derivatives Hives  . Tramadol Nausea Only  . Vytorin [Ezetimibe-Simvastatin] Other (See Comments)    Unknown  . Tape Itching    Patient Measurements: Height: 5\' 8"  (172.7 cm) Weight: 74.8 kg (165 lb) IBW/kg (Calculated) : 63.9 Heparin Dosing Weight: 74.8kg  Vital Signs: Temp: 98.4 F (36.9 C) (07/14 1201) BP: 161/79 (07/14 1530) Pulse Rate: 63 (07/14 1530)  Labs: Recent Labs    05/17/20 1209 05/17/20 1513  HGB 13.4  --   HCT 42.4  --   PLT 226  --   CREATININE 1.26*  --   TROPONINIHS 510* 581*    Estimated Creatinine Clearance: 35.3 mL/min (A) (by C-G formula based on SCr of 1.26 mg/dL (H)).   Medical History: Past Medical History:  Diagnosis Date  . A-fib ([place])   . Allergy   . CAD (coronary artery disease)    sees Dr. Shelva Majestic  cardiac stents - 2000  . CHF (congestive heart failure) ([place])   . Colon polyps   . Complication of anesthesia    rash/hives with "caines"  . Dyspnea    02/12/18 " when my heart gets out of rhythm, it has not been out of rhythym- since I have been on Tikosyn (11/2017)  . Dysrhythmia    afib fib  . GERD (gastroesophageal reflux disease)    takes OTC- Omeprazole- prn  . Heart murmur   . History of stress test    show normal perfusion without scar or ischemia, post EF 68%  . Hx of echocardiogram    show an EF 55%-60% range with grade 1 diastolic dysfunction, she had mitral anular calcification with mild MR, moderate LA dilation and mild pulmonary hypertension with a PA estimated pressure of 40mm  . Hyperlipidemia   . Hypertension   . NSTEMI (non-ST elevated myocardial infarction) ([place])   . Osteoarthritis    . Pneumonia    hx of 2015   . PONV (postoperative nausea and vomiting)     Medications:  Scheduled:  . sodium chloride flush  3 mL Intravenous Once    Assessment: Patient is a 79yo female who presents with chest pain. Patient is on Eliquis at home for Afib. Last dose of Eliquis 5mg  7/14 at 0800. Troponin [id number]. H/H & Plt wnl.  Goal of Therapy:  aPTT 66-102 seconds Monitor platelets by anticoagulation protocol: Yes   Plan:  No heparin bolus as last dose of Eliquis was 7/14 at 0800. Start heparin infusion at 900 units/hr  ~6h aPTT Will monitor heparin using aPTT until aPTT and heparin level correlate Monitor CBC & s/sx of bleeding  Beckey Rutter, PharmD Candidate 05/17/2020,3:59 PM

## 2020-05-17 NOTE — Telephone Encounter (Signed)
° ° °  Patient c/o Palpitations:  High priority if patient c/o lightheadedness, shortness of breath, or chest pain  1) How long have you had palpitations/irregular HR/ Afib? Are you having the symptoms now? No, last night  2) Are you currently experiencing lightheadedness, SOB or CP? No  3) Do you have a history of afib (atrial fibrillation) or irregular heart rhythm? Yes  4) Have you checked your BP or HR? (document readings if available): last BP 150/79  5) Are you experiencing any other symptoms?Pt said last night she felt her heart beating fast and hard. She feeling nauseous, sob and chest pain, she said she took a purple pill and that helps. She said she is feeling ok when she woke up but wanted to check in with DR. Claiborne Billings if she needs to be seen

## 2020-05-17 NOTE — ED Provider Notes (Signed)
[facility] EMERGENCY DEPARTMENT Provider Note   CSN: [id number] Arrival date & time: 05/17/20  1148     History Chief Complaint  Patient presents with  . Chest Pain  . Palpitations    JUPITER BOYS is a 79 y.o. female with a history of CAD S/p PCI most recent heart cath 02/2019, last EF 70-75%, hypertension, hyperlipidemia, and paroxysmal atrial fibrillation on Eliquis who presents to the emergency department with complaints of chest pain that began last night @ 17:30 and is resolved @ this time.  Patient states that last night when she was walking the trash out she became quite overheated, short of breath, and developed a central chest pressure that radiated to the left upper extremity with associated nausea.  She returned to the house and symptoms seem to persist.  Seemed a bit better with rest, worse when she was up and moving.  She relates that she thought it might have been acid reflux therefore she took a medicine for this and was able to go to bed.  She woke up this morning with resolution of her chest discomfort, however she does remain a bit dyspneic with exertion.  She currently is asymptomatic and pain-free.  Denies fever, chills, dizziness, lightheadedness, syncope, vomiting, lower extremity swelling, hemoptysis, recent trauma, recent long travel, hormone use, personal hx of cancer, or hx of DVT/PE.  Patient had a recent shoulder surgery in May of this year.  Her cardiologist is Dr. Claiborne Billings.   HPI     Past Medical History:  Diagnosis Date  . A-fib ([place])   . Allergy   . CAD (coronary artery disease)    sees Dr. Shelva Majestic  cardiac stents - 2000  . CHF (congestive heart failure) ([place])   . Colon polyps   . Complication of anesthesia    rash/hives with "caines"  . Dyspnea    02/12/18 " when my heart gets out of rhythm, it has not been out of rhythym- since I have been on Tikosyn (11/2017)  . Dysrhythmia    afib fib  . GERD (gastroesophageal reflux  disease)    takes OTC- Omeprazole- prn  . Heart murmur   . History of stress test    show normal perfusion without scar or ischemia, post EF 68%  . Hx of echocardiogram    show an EF 55%-60% range with grade 1 diastolic dysfunction, she had mitral anular calcification with mild MR, moderate LA dilation and mild pulmonary hypertension with a PA estimated pressure of 35mm  . Hyperlipidemia   . Hypertension   . NSTEMI (non-ST elevated myocardial infarction) ([place])   . Osteoarthritis   . Pneumonia    hx of 2015   . PONV (postoperative nausea and vomiting)     Patient Active Problem List   Diagnosis Date Noted  . 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 PAF (paroxysmal atrial fibrillation) ([place]) 02/28/2019  . NSTEMI (non-ST elevated myocardial infarction) ([place]) 02/25/2019  . Pain in left knee 01/06/2018  . Dysphonia 12/29/2017  . 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 CHF (congestive heart failure) ([place]) 11/16/2017  . Gastroesophageal reflux disease 11/06/2017  . Bradycardia 12/27/2015  . Exertional dyspnea 12/23/2014  . Preoperative clearance 12/23/2014  . Hyperlipidemia LDL goal <70 09/13/2014  . Sinus bradycardia 09/13/2014  . LUMBAGO 05/03/2010  . VERTIGO 01/21/2009  . TEMPOROMANDIBULAR JOINT PAIN 08/31/2008  . Osteoarthritis 05/31/2008  . 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 HYPERLIPIDEMIA 04/06/2007  . Hypertensive disorder  04/06/2007  . Coronary atherosclerosis 04/06/2007  . ALLERGIC RHINITIS 04/06/2007  . Personal history of colonic polyps 04/06/2007    Past Surgical History:  Procedure Laterality Date  . CARDIAC CATHETERIZATION     11/2017  . cardiac stents  2000  . COLONOSCOPY  01-05-14   per Dr. Teena Irani, clear, no repeats needed   . COLONOSCOPY    . CORONARY STENT PLACEMENT  2000   in LAD  . DIRECT LARYNGOSCOPY WITH RADIAESSE INJECTION N/A 02/13/2018   Procedure: DIRECT LARYNGOSCOPY WITH RADIAESSE INJECTION;  Surgeon: Melida Quitter, MD;   Location: [facility];  Service: ENT;  Laterality: N/A;  . EYE SURGERY Left    CATARACT REMOVAL  . KNEE ARTHROSCOPY Left 01/06/2015   Procedure: LEFT KNEE ARTHROSCOPY, abrasion chondroplasty of the medial femerol condryl,medial and lateral menisectomy, microfracture , synovectomy of the suprpatellar pouch;  Surgeon: Latanya Maudlin, MD;  Location: [facility];  Service: Orthopedics;  Laterality: Left;  . LEFT HEART CATH AND CORONARY ANGIOGRAPHY N/A 02/26/2019   Procedure: LEFT HEART CATH AND CORONARY ANGIOGRAPHY;  Surgeon: Troy Sine, MD;  Location: [facility];  Service: Cardiovascular;  Laterality: N/A;  . MICROLARYNGOSCOPY W/VOCAL CORD INJECTION N/A 08/07/2018   Procedure: MICROLARYNGOSCOPY WITH VOCAL CORD INJECTION OF PROLARYN;  Surgeon: Melida Quitter, MD;  Location: [facility];  Service: ENT;  Laterality: N/A;  JET VENTILATION  . REVERSE SHOULDER ARTHROPLASTY Right 03/23/2020   Procedure: REVERSE SHOULDER ARTHROPLASTY;  Surgeon: Nicholes Stairs, MD;  Location: [facility];  Service: Orthopedics;  Laterality: Right;  Marland Kitchen VAGINAL HYSTERECTOMY  1971     OB History   No obstetric history on file.     Family History  Problem Relation Age of Onset  . Cancer Maternal Grandmother   . Heart disease Maternal Grandfather     Social History   Tobacco Use  . Smoking status: Former Smoker    Years: 40.00    Quit date: 11/04/1996    Years since quitting: 23.5  . Smokeless tobacco: Never Used  Vaping Use  . Vaping Use: Never used  Substance Use Topics  . Alcohol use: No    Alcohol/week: 0.0 standard drinks  . Drug use: No    Home Medications Prior to Admission medications   Medication Sig Start Date End Date Taking? Authorizing Provider  apixaban (ELIQUIS) 2.5 MG TABS tablet Take 1 tablet (2.5 mg total) by mouth 2 (two) times daily. Please contact primary care for follow-up 03/24/20   Nicholes Stairs, MD  apixaban (ELIQUIS) 5 MG TABS tablet Take 1 tablet (5 mg total) by mouth 2 (two) times  daily. 12/23/19   Darreld Mclean, PA-C  Biotin 5000 MCG SUBL Place 10,000 mcg under the tongue daily.    [provider]  candesartan (ATACAND) 16 MG tablet Take 1 tablet (16 mg total) by mouth daily. 02/25/20   Troy Sine, MD  Cholecalciferol (VITAMIN D3) 2000 units TABS Take 2,000 Units by mouth daily.    [provider]  dextromethorphan (DELSYM) 30 MG/5ML liquid Take 15 mg by mouth daily as needed for cough.     [provider]  dofetilide (TIKOSYN) 125 MCG capsule Take 1 capsule (125 mcg total) by mouth 2 (two) times daily. 05/15/20   Troy Sine, MD  HOMEOPATHIC PRODUCTS PO Take 2 tablets by mouth 3 (three) times daily as needed (pain/fatigue). MagniLife Pain and Fatigue Relief (Aconitum napellus 30C HPUS Arsenicum album 30C HPUS Belladonna 30C HPUS Coniinum 30C HPUS Gelsemium  sempervirens 30CHPUS Hypericum perforatum 3X, 30C HPUS Kali bichromicum 30C Phosphoricum acidum 30CHPUS Rhus tox 30C HPUS Uricum acidum 30C HPUS Lacticum acidum 30C HPUS Lactose, Magnesium Stearate, Microcrystalline Cellulose)    [provider]  HYDROcodone-acetaminophen (NORCO) 5-325 MG tablet Take 1 tablet by mouth every 6 (six) hours as needed for moderate pain. 03/24/20   Nicholes Stairs, MD  hypromellose (VISTA GEL DRY EYE RELIEF) 0.3 % GEL ophthalmic ointment Place 1 application into both eyes at bedtime.    [provider]  ipratropium (ATROVENT) 0.06 % nasal spray Place 2 sprays into both nostrils daily as needed for rhinitis.  04/28/18   [provider]  ketotifen (ALAWAY) 0.025 % ophthalmic solution Place 1 drop into both eyes 2 (two) times daily as needed (eye irritation.).    [provider]  loratadine (CLARITIN) 10 MG tablet Take 10 mg by mouth daily as needed (allergies/sinuses.).     [provider]  methocarbamol (ROBAXIN) 500 MG tablet Take 1 tablet (500 mg total) by mouth every 6 (six) hours as needed for muscle  spasms. Patient not taking: Reported on 03/31/2020 03/24/20   Nicholes Stairs, MD  Multiple Vitamin (MULTIVITAMIN WITH MINERALS) TABS tablet Take 1 tablet by mouth daily.    [provider]  naproxen sodium (ALEVE) 220 MG tablet Take 220 mg by mouth 2 (two) times daily as needed (pain.).     [provider]  omeprazole (PRILOSEC OTC) 20 MG tablet Take 20 mg by mouth daily as needed (for acid reflux).     [provider]  rosuvastatin (CRESTOR) 40 MG tablet Take 1 tablet (40 mg total) by mouth daily. Patient taking differently: Take 40 mg by mouth at bedtime.  02/25/20   Troy Sine, MD  spironolactone (ALDACTONE) 25 MG tablet Take 1 tablet (25 mg total) by mouth daily. 02/25/20   Troy Sine, MD  triamcinolone (NASACORT) 55 MCG/ACT nasal inhaler Place 1 spray into both nostrils daily as needed (for allergies).  10/17/12   Laurey Morale, MD    Allergies    Lidocaine, Morphine, Procaine hcl, Sulfonamide derivatives, Tramadol, Vytorin [ezetimibe-simvastatin], and Tape  Review of Systems   Review of Systems  Constitutional: Negative for chills and fever.  Respiratory: Positive for shortness of breath.   Cardiovascular: Positive for chest pain. Negative for leg swelling.  Gastrointestinal: Positive for nausea. Negative for abdominal pain and vomiting.  Genitourinary: Negative for dysuria.  Neurological: Negative for dizziness, syncope and light-headedness.  All other systems reviewed and are negative.   Physical Exam Updated Vital Signs BP 139/61 (BP Location: Right Arm)   Pulse (!) 56   Temp 98.4 F (36.9 C)   Resp 17   SpO2 100%   Physical Exam Vitals and nursing note reviewed.  Constitutional:      General: She is not in acute distress.    Appearance: She is well-developed. She is not toxic-appearing.  HENT:     Head: Normocephalic and atraumatic.  Eyes:     General:        Right eye: No discharge.        Left eye: No discharge.      Conjunctiva/sclera: Conjunctivae normal.  Cardiovascular:     Rate and Rhythm: Normal rate and regular rhythm.     Pulses:          Radial pulses are 2+ on the right side and 2+ on the left side.  Pulmonary:     Effort: Pulmonary  effort is normal. No respiratory distress.     Breath sounds: Normal breath sounds. No wheezing, rhonchi or rales.  Abdominal:     General: There is no distension.     Palpations: Abdomen is soft.     Tenderness: There is no abdominal tenderness.  Musculoskeletal:     Cervical back: Neck supple.     Right lower leg: No edema.     Left lower leg: No edema.     Comments: L knee brace in place.   Skin:    General: Skin is warm and dry.     Findings: No rash.  Neurological:     Mental Status: She is alert.     Comments: Clear speech.   Psychiatric:        Behavior: Behavior normal.     ED Results / Procedures / Treatments   Labs (all labs ordered are listed, but only abnormal results are displayed) Labs Reviewed  BASIC METABOLIC PANEL - Abnormal; Notable for the following components:      Result Value   CO2 21 (*)    Glucose, Bld 149 (*)    Creatinine, Ser 1.26 (*)    Calcium 10.6 (*)    GFR calc non Af Amer 40 (*)    GFR calc Af Amer 46 (*)    All other components within normal limits  TROPONIN I (HIGH SENSITIVITY) - Abnormal; Notable for the following components:   Troponin I (High Sensitivity) 510 (*)    All other components within normal limits  CBC  TROPONIN I (HIGH SENSITIVITY)    EKG EKG Interpretation  Date/Time:  Wednesday May 17 2020 15:09:21 EDT Ventricular Rate:  56 PR Interval:  188 QRS Duration: 86 QT Interval:  442 QTC Calculation: 426 R Axis:   78 Text Interpretation: Sinus bradycardia Minimal voltage criteria for LVH, may be normal variant ( Sokolow-Lyon ) ST & Marked T wave abnormality, consider inferior ischemia Abnormal ECG No significant change since last tracing Confirmed by Deno Etienne [phone]) on 05/17/2020 3:10:40  PM  EKG Interpretation  Date/Time:  Wednesday May 17 2020 15:23:03 EDT Ventricular Rate:  59 PR Interval:  188 QRS Duration: 76 QT Interval:  463 QTC Calculation: 459 R Axis:   79 Text Interpretation: Sinus rhythm LVH with secondary repolarization abnormality ST depr, consider ischemia, inferior leads similar to prior today Confirmed by Aletta Edouard [phone]) on 05/17/2020 3:28:21 PM   Radiology DG Chest 2 View  Result Date: 05/17/2020 CLINICAL DATA:  Chest pain and shortness of breath. EXAM: CHEST - 2 VIEW COMPARISON:  Chest x-ray dated February 25, 2019. FINDINGS: Stable cardiomediastinal silhouette. Normal pulmonary vascularity. No focal consolidation, pleural effusion, or pneumothorax. No acute osseous abnormality. Interval right reverse total shoulder arthroplasty. IMPRESSION: 1. No acute cardiopulmonary disease. Electronically Signed   By: Titus Dubin M.D.   On: 05/17/2020 12:47    Procedures .Critical Care Performed by: Amaryllis Dyke, PA-C Authorized by: Amaryllis Dyke, PA-C    CRITICAL CARE Performed by: Kennith Maes   Total critical care time: 35 minutes  Critical care time was exclusive of separately billable procedures and treating other patients.  Critical care was necessary to treat or prevent imminent or life-threatening deterioration.  Critical care was time spent personally by me on the following activities: development of treatment plan with patient and/or surrogate as well as nursing, discussions with consultants, evaluation of patient's response to treatment, examination of patient, obtaining history from patient or surrogate, ordering and performing treatments  and interventions, ordering and review of laboratory studies, ordering and review of radiographic studies, pulse oximetry and re-evaluation of patient's condition.    (including critical care time)  Medications Ordered in ED Medications  sodium chloride flush (NS) 0.9 %  injection 3 mL (has no administration in time range)  heparin ADULT infusion 100 units/mL (25000 units/270mL sodium chloride 0.45%) (has no administration in time range)  aspirin chewable tablet 324 mg (324 mg Oral Given 05/17/20 1544)    ED Course  I have reviewed the triage vital signs and the nursing notes.  Pertinent labs & imaging results that were available during my care of the patient were reviewed by me and considered in my medical decision making (see chart for details).    SHAQUINTA PERUSKI was evaluated in Emergency Department on 05/17/2020 for the symptoms described in the history of present illness. He/she was evaluated in the context of the global COVID-19 pandemic, which necessitated consideration that the patient might be at risk for infection with the SARS-CoV-2 virus that causes COVID-19. Institutional protocols and algorithms that pertain to the evaluation of patients at risk for COVID-19 are in a state of rapid change based on information released by regulatory bodies including the CDC and federal and state organizations. These policies and algorithms were followed during the patient's care in the ED.  MDM Rules/Calculators/A&P                         Patient presents to the ED with complaints of chest pain & dyspnea last night, resolved currently. BP somewhat elevated, vitals otherwise without significant abnormality. Fairly benign physical exam.  DDX: ACS, pulmonary embolism, dissection, pneumothorax, pneumonia, arrhythmia, severe anemia, MSK, GERD, anxiety.   Additional history obtained:  Additional history obtained from chart & nursing note review. Previous records obtained and reviewed.   April 2020 left heart cath:  Prox RCA to Mid RCA lesion is 15% stenosed.  Ost LAD to Prox LAD lesion is 20% stenosed.  Prox Cx lesion is 20% stenosed.   Mild non-obstructive coronary artery disease with a patent proximal LAD stent with mild 20% intimal hyperplasia (inserted   09/1999); 20% proximal left circumflex narrowing, and mild irregularity of 10 to 15% in the mid RCA.  Hyperdynamic LV function with an "Ace of Spade "configuration and near cavity obliteration in the mid to apical segment during systole.  There is evidence for left ventricular hypertrophy.  LVEDP is 16 mm.  RECOMMENDATION: Medical therapy with optimal blood pressure control, lipid management, and resumption of Eliquis tomorrow.  EKG: Inferior T wave inversions & ST depression, similar to prior earlier today.  Lab Tests:  I Ordered, reviewed, and interpreted labs, which included:  CBC: No significant anemia or leukocytosis. BMP: Mildly elevated creatinine and calcium fairly similar to previous ranges. Troponin: Elevated at 510  Imaging Studies ordered:  I ordered imaging studies which included CXR, I independently visualized and interpreted imaging which showed No acute cardiopulmonary disease.  ED Course:  Patient with exertional chest pain last night, currently pain-free.  EKG with inverted T waves in the inferior leads which have been present previously however does appear to have new ST depressions inferiorly.  Repeat EKGs today appear similar to earlier in ED course.  Initial troponin is elevated at 510.  Will discuss with cardiology.  Aspirin and heparin ordered.  Currently pain-free therefore no nitroglycerin administered.  15:27: CONSULT: Discussed case with cards master, cardiology team to see in the  ED.  Repeat troponin up-trending some at 581  Patient to be admitted to cardiology service per H&P noted in chart review.   Findings and plan of care discussed with supervising physician Dr. Melina Copa who has evaluated patient as shared visit & is in agreement.   Portions of this note were generated with [employer]. Dictation errors may occur despite best attempts at proofreading.  Final Clinical Impression(s) / ED Diagnoses Final diagnoses:  NSTEMI (non-ST elevated  myocardial infarction) [facility])    Rx / DC Orders ED Discharge Orders    None       Amaryllis Dyke, PA-C 05/17/20 1732    Hayden Rasmussen, MD 05/17/20 Kathyrn Drown

## 2020-05-17 NOTE — ED Notes (Signed)
Barbara [name], [place] notified of patient's HR,

## 2020-05-17 NOTE — ED Notes (Signed)
Pt up to bedside commode and back to bed, pt able to transfer self with steady gait. Call bell in reach.

## 2020-05-17 NOTE — ED Notes (Addendum)
Barbara [name] [phone] contact with updates and d/c.

## 2020-05-17 NOTE — Telephone Encounter (Signed)
Pt called to report that she has been having pressure in her chest since last night.. she has been nauseous and she brought up some phlegm early this morning. Pt says she has been SOB with minimal exertion.. but no peripheral edema. She said she took a Prilosec and she felt better for a short time but it came back and she said that she does not feel well. Pt says when she gets up her chest pressure gets worse so she does not feel like she can get up and move around. Pt is home with her husband but he cannot drive due to his health problems. The pt also reports that she recently had orthopaedic surgery and has been doing well but she has not been moving much. She will call her friend and have her take her to the ED for more immediate assessment.

## 2020-05-18 ENCOUNTER — Observation Stay (HOSPITAL_BASED_OUTPATIENT_CLINIC_OR_DEPARTMENT_OTHER): Payer: Medicare Other

## 2020-05-18 DIAGNOSIS — E7800 Pure hypercholesterolemia, unspecified: Secondary | ICD-10-CM | POA: Diagnosis not present

## 2020-05-18 DIAGNOSIS — I2510 Atherosclerotic heart disease of native coronary artery without angina pectoris: Secondary | ICD-10-CM | POA: Diagnosis not present

## 2020-05-18 DIAGNOSIS — I340 Nonrheumatic mitral (valve) insufficiency: Secondary | ICD-10-CM | POA: Diagnosis not present

## 2020-05-18 DIAGNOSIS — N1831 Chronic kidney disease, stage 3a: Secondary | ICD-10-CM

## 2020-05-18 DIAGNOSIS — R778 Other specified abnormalities of plasma proteins: Secondary | ICD-10-CM | POA: Diagnosis not present

## 2020-05-18 DIAGNOSIS — I10 Essential (primary) hypertension: Secondary | ICD-10-CM | POA: Diagnosis not present

## 2020-05-18 LAB — BASIC METABOLIC PANEL
Anion gap: 10 (ref 5–15)
BUN: 20 mg/dL (ref 8–23)
CO2: 25 mmol/L (ref 22–32)
Calcium: 10.5 mg/dL — ABNORMAL HIGH (ref 8.9–10.3)
Chloride: 104 mmol/L (ref 98–111)
Creatinine, Ser: 1.22 mg/dL — ABNORMAL HIGH (ref 0.44–1.00)
GFR calc Af Amer: 48 mL/min — ABNORMAL LOW (ref >60)
GFR calc non Af Amer: 42 mL/min — ABNORMAL LOW (ref >60)
Glucose, Bld: 99 mg/dL (ref 70–99)
Potassium: 4.3 mmol/L (ref 3.5–5.1)
Sodium: 139 mmol/L (ref 135–145)

## 2020-05-18 LAB — ECHOCARDIOGRAM COMPLETE
Height: 68 in
Weight: 2640 oz

## 2020-05-18 LAB — MAGNESIUM: Magnesium: 1.8 mg/dL (ref 1.7–2.4)

## 2020-05-18 MED ORDER — MAGNESIUM SULFATE 2 GM/50ML IV SOLN
2.0000 g | Freq: Once | INTRAVENOUS | Status: AC
  Administered 2020-05-18: 2 g via INTRAVENOUS
  Filled 2020-05-18: qty 50

## 2020-05-18 MED ORDER — PERFLUTREN LIPID MICROSPHERE
1.0000 mL | INTRAVENOUS | Status: AC | PRN
  Administered 2020-05-18: 2 mL via INTRAVENOUS
  Filled 2020-05-18: qty 10

## 2020-05-18 NOTE — Progress Notes (Signed)
Progress Note  Patient Name: Barbara [name] Date of Encounter: 05/18/2020  Primary Cardiologist: Shelva Majestic, MD   Subjective   Denies any chest pain or SOB  Inpatient Medications    Scheduled Meds: . apixaban  5 mg Oral BID  . aspirin EC  81 mg Oral Daily  . dofetilide  125 mcg Oral BID  . irbesartan  150 mg Oral Daily  . rosuvastatin  40 mg Oral QHS  . spironolactone  25 mg Oral Daily   Continuous Infusions:  PRN Meds: nitroGLYCERIN, ondansetron (ZOFRAN) IV, pantoprazole   Vital Signs    Vitals:   05/18/20 0400 05/18/20 0500 05/18/20 0642 05/18/20 0848  BP: (!) 145/56 (!) 129/51 124/60 (!) 147/68  Pulse: (!) 48 (!) 49 (!) 52 (!) 55  Resp: 15 16 18 15   Temp:      SpO2: 99% 98% 99% 100%  Weight:      Height:       No intake or output data in the 24 hours ending 05/18/20 0856 Filed Weights   05/17/20 1545  Weight: 74.8 kg    Telemetry    Sinus bradycardia - Personally Reviewed  ECG    No new EKG to revivew - Personally Reviewed  Physical Exam   GEN: No acute distress.   Neck: No JVD Cardiac: RRR, no murmurs, rubs, or gallops.  Respiratory: Clear to auscultation bilaterally. GI: Soft, nontender, non-distended  MS: No edema; No deformity. Neuro:  Nonfocal  Psych: Normal affect   Labs    Chemistry Recent Labs  Lab 05/17/20 1209 05/17/20 1807 05/18/20 0531  NA 139 139 139  K 4.2 4.4 4.3  CL 107 105 104  CO2 21* 25 25  GLUCOSE 149* 94 99  BUN 20 20 20   CREATININE 1.26* 1.32* 1.22*  CALCIUM 10.6* 10.3 10.5*  GFRNONAA 40* 38* 42*  GFRAA 46* 44* 48*  ANIONGAP 11 9 10      Hematology Recent Labs  Lab 05/17/20 1209  WBC 6.1  RBC 4.52  HGB 13.4  HCT 42.4  MCV 93.8  MCH 29.6  MCHC 31.6  RDW 13.0  PLT 226    Cardiac EnzymesNo results for input(s): TROPONINI in the last 168 hours. No results for input(s): TROPIPOC in the last 168 hours.   BNPNo results for input(s): BNP, PROBNP in the last 168 hours.   DDimer No  results for input(s): DDIMER in the last 168 hours.   Radiology    DG Chest 2 View  Result Date: 05/17/2020 CLINICAL DATA:  Chest pain and shortness of breath. EXAM: CHEST - 2 VIEW COMPARISON:  Chest x-ray dated February 25, 2019. FINDINGS: Stable cardiomediastinal silhouette. Normal pulmonary vascularity. No focal consolidation, pleural effusion, or pneumothorax. No acute osseous abnormality. Interval right reverse total shoulder arthroplasty. IMPRESSION: 1. No acute cardiopulmonary disease. Electronically Signed   By: Titus Dubin M.D.   On: 05/17/2020 12:47    Cardiac Studies   none  Patient Profile     79 y.o. female with past medical history of CAD, PAF on Eliquis, HTN, HLD, history of asymptomatic bradycardia, and severe obstructive sleep apnea on CPAP presented with chest pain and elevated troponin.  Assessment & Plan    1.  Chest pain -her symptoms are similar to her CP last year in setting of elevated trop (1.7 at that time) and cath showed no significant CAD and patent LAD stent.   -now with similar CP and similar bump in trop (hsTrop 500 and lower  than 1.7 old troponin assay).   -doubt that this is ACS -will get a 2D echo to assess LVF >>if LVF is normal then will proceed with Lexiscan myoview.  If LVF is reduced then would plan to proceed with LHC.  -she is NPO and I have asked Echo lab to do echo stat so we can get before Lexi  2.  ASCAD -s/p remote BMS to the LAD in 2000 -cath a year ago for elevated trop and CP showed 20% intimal hyperplasia in LAD stent and 20% LCx.  -continue ASA 81mg  daily and statin  3.  HLD -LDL goal < 70 -continue Crestor 40mg  daily -LDL was 53 in Jan 2021  4.  PAF -maintaining NSR -continue Tikosyn 115mcg BID -plan to continue Eliquis 5mg  BID for now unless she needs cath  5.  HTN -BP controlled -continue spiro 25mg  daily and Atacand (substitute in hospital with Irbesartan 150mg  daily)  6.  CKD stage 3a -creatinine stable at  1.22   For questions or updates, please contact [place] Please consult [email or web address] for contact info under Cardiology/STEMI.      Signed, Fransico Him, MD  05/18/2020, 8:56 AM

## 2020-05-18 NOTE — ED Notes (Signed)
Breakfast Ordered--Barbara [name]  

## 2020-05-18 NOTE — Progress Notes (Signed)
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  Will set up cardiac MRI for tomorrow.

## 2020-05-18 NOTE — ED Notes (Signed)
Lunch Tray Ordered @ 1120. 

## 2020-05-18 NOTE — Progress Notes (Signed)
Pharmacy: Dofetilide (Tikosyn) - Follow Up Assessment and Electrolyte Replacement  Pharmacy consulted to assist in monitoring and replacing electrolytes in this 79 y.o. female admitted on 05/17/2020 continuing dofetilide from PTA 125 mcg BID  Labs:    Component Value Date/Time   K 4.3 05/18/2020 0531   MG 1.8 05/18/2020 0531     Plan: Potassium: K >/= 4: No additional supplementation needed  Magnesium: Mg 1.8-2: Give Mg 2 gm IV x1    Thank you for allowing pharmacy to participate in this patient's care   Bertis Ruddy 05/18/2020  9:32 AM

## 2020-05-18 NOTE — Progress Notes (Signed)
  Echocardiogram 2D Echocardiogram has been performed.  Jennette Dubin 05/18/2020, 10:14 AM

## 2020-05-19 ENCOUNTER — Observation Stay (HOSPITAL_COMMUNITY): Payer: Medicare Other

## 2020-05-19 ENCOUNTER — Observation Stay (HOSPITAL_BASED_OUTPATIENT_CLINIC_OR_DEPARTMENT_OTHER): Payer: Medicare Other

## 2020-05-19 ENCOUNTER — Telehealth: Payer: Self-pay | Admitting: Cardiology

## 2020-05-19 ENCOUNTER — Encounter (HOSPITAL_COMMUNITY): Payer: Self-pay | Admitting: Cardiology

## 2020-05-19 DIAGNOSIS — R079 Chest pain, unspecified: Secondary | ICD-10-CM | POA: Diagnosis not present

## 2020-05-19 DIAGNOSIS — I422 Other hypertrophic cardiomyopathy: Secondary | ICD-10-CM

## 2020-05-19 DIAGNOSIS — K219 Gastro-esophageal reflux disease without esophagitis: Secondary | ICD-10-CM

## 2020-05-19 DIAGNOSIS — I10 Essential (primary) hypertension: Secondary | ICD-10-CM | POA: Diagnosis not present

## 2020-05-19 DIAGNOSIS — R778 Other specified abnormalities of plasma proteins: Secondary | ICD-10-CM | POA: Diagnosis not present

## 2020-05-19 LAB — NM MYOCAR MULTI W/SPECT W/WALL MOTION / EF
Estimated workload: 1 METS
Exercise duration (min): 0 min
Exercise duration (sec): 0 s
MPHR: 139 {beats}/min
Peak HR: 84 {beats}/min
Percent HR: 60 %
RPE: 0
Rest HR: 57 {beats}/min

## 2020-05-19 LAB — BASIC METABOLIC PANEL
Anion gap: 11 (ref 5–15)
BUN: 22 mg/dL (ref 8–23)
CO2: 24 mmol/L (ref 22–32)
Calcium: 10.3 mg/dL (ref 8.9–10.3)
Chloride: 102 mmol/L (ref 98–111)
Creatinine, Ser: 1.33 mg/dL — ABNORMAL HIGH (ref 0.44–1.00)
GFR calc Af Amer: 43 mL/min — ABNORMAL LOW (ref >60)
GFR calc non Af Amer: 37 mL/min — ABNORMAL LOW (ref >60)
Glucose, Bld: 100 mg/dL — ABNORMAL HIGH (ref 70–99)
Potassium: 4.4 mmol/L (ref 3.5–5.1)
Sodium: 137 mmol/L (ref 135–145)

## 2020-05-19 LAB — MAGNESIUM: Magnesium: 2.2 mg/dL (ref 1.7–2.4)

## 2020-05-19 MED ORDER — TECHNETIUM TC 99M TETROFOSMIN IV KIT
10.0000 | PACK | Freq: Once | INTRAVENOUS | Status: AC | PRN
  Administered 2020-05-19: 10 via INTRAVENOUS

## 2020-05-19 MED ORDER — TECHNETIUM TC 99M TETROFOSMIN IV KIT
31.0000 | PACK | Freq: Once | INTRAVENOUS | Status: AC | PRN
  Administered 2020-05-19: 31 via INTRAVENOUS

## 2020-05-19 MED ORDER — GADOBUTROL 1 MMOL/ML IV SOLN
7.0000 mL | Freq: Once | INTRAVENOUS | Status: AC | PRN
  Administered 2020-05-19: 7 mL via INTRAVENOUS

## 2020-05-19 MED ORDER — REGADENOSON 0.4 MG/5ML IV SOLN
0.4000 mg | Freq: Once | INTRAVENOUS | Status: AC
  Filled 2020-05-19: qty 5

## 2020-05-19 MED ORDER — REGADENOSON 0.4 MG/5ML IV SOLN
INTRAVENOUS | Status: AC
  Administered 2020-05-19: 0.4 mg via INTRAVENOUS
  Filled 2020-05-19: qty 5

## 2020-05-19 MED ORDER — NITROGLYCERIN 0.4 MG SL SUBL: 0.4000 mg | SUBLINGUAL_TABLET | SUBLINGUAL | 3 refills | Status: DC | PRN

## 2020-05-19 MED ORDER — ASPIRIN 81 MG PO TBEC: 81.0000 mg | DELAYED_RELEASE_TABLET | Freq: Every day | ORAL | 11 refills | Status: DC

## 2020-05-19 NOTE — Telephone Encounter (Signed)
Patient has TOC appointment with Ermalinda Barrios at 11:45 am per Roby Lofts.

## 2020-05-19 NOTE — Progress Notes (Signed)
Progress Note  Patient Name: Barbara [name] Date of Encounter: 05/19/2020  [facility] Cardiologist: Shelva Majestic, MD   Subjective   Lt shoulder pain no chest pain or SOB  No BM in 2-3 days  Inpatient Medications    Scheduled Meds: . apixaban  5 mg Oral BID  . aspirin EC  81 mg Oral Daily  . dofetilide  125 mcg Oral BID  . irbesartan  150 mg Oral Daily  . rosuvastatin  40 mg Oral QHS  . spironolactone  25 mg Oral Daily   Continuous Infusions:  PRN Meds: nitroGLYCERIN, ondansetron (ZOFRAN) IV, pantoprazole   Vital Signs    Vitals:   05/19/20 1030 05/19/20 1054 05/19/20 1056 05/19/20 1058  BP: (!) 192/74 (!) 167/59 (!) 158/55 (!) 153/60  Pulse:      Resp:      Temp:      TempSrc:      SpO2:      Weight:      Height:        Intake/Output Summary (Last 24 hours) at 05/19/2020 1103 Last data filed at 05/18/2020 1859 Gross per 24 hour  Intake 240 ml  Output --  Net 240 ml   Last 3 Weights 05/18/2020 05/17/2020 03/31/2020  Weight (lbs) 170 lb 13.7 oz 165 lb 170 lb 12.8 oz  Weight (kg) 77.5 kg 74.844 kg 77.474 kg      Telemetry    SR with occ PVC - Personally Reviewed  ECG    No new - Personally Reviewed  Physical Exam   GEN: No acute distress.   Neck: No JVD Cardiac: RRR, no murmurs, rubs, or gallops.  Respiratory: Clear to auscultation bilaterally. GI: Soft, nontender, non-distended  MS: No edema; No deformity. Neuro:  Nonfocal  Psych: Normal affect   Labs    High Sensitivity Troponin:   Recent Labs  Lab 05/17/20 1209 05/17/20 1513 05/17/20 1807 05/17/20 2117  TROPONINIHS 510* 581* 543* 533*      Chemistry Recent Labs  Lab 05/17/20 1807 05/18/20 0531 05/19/20 0429  NA 139 139 137  K 4.4 4.3 4.4  CL 105 104 102  CO2 25 25 24   GLUCOSE 94 99 100*  BUN 20 20 22   CREATININE 1.32* 1.22* 1.33*  CALCIUM 10.3 10.5* 10.3  GFRNONAA 38* 42* 37*  GFRAA 44* 48* 43*  ANIONGAP 9 10 11      Hematology Recent Labs  Lab  05/17/20 1209  WBC 6.1  RBC 4.52  HGB 13.4  HCT 42.4  MCV 93.8  MCH 29.6  MCHC 31.6  RDW 13.0  PLT 226    BNPNo results for input(s): BNP, PROBNP in the last 168 hours.   DDimer No results for input(s): DDIMER in the last 168 hours.   Radiology    DG Chest 2 View  Result Date: 05/17/2020 CLINICAL DATA:  Chest pain and shortness of breath. EXAM: CHEST - 2 VIEW COMPARISON:  Chest x-ray dated February 25, 2019. FINDINGS: Stable cardiomediastinal silhouette. Normal pulmonary vascularity. No focal consolidation, pleural effusion, or pneumothorax. No acute osseous abnormality. Interval right reverse total shoulder arthroplasty. IMPRESSION: 1. No acute cardiopulmonary disease. Electronically Signed   By: Titus Dubin M.D.   On: 05/17/2020 12:47   ECHOCARDIOGRAM COMPLETE  Result Date: 05/18/2020    ECHOCARDIOGRAM REPORT   Patient Name:   Barbara [name] [facility] Date of Exam: 05/18/2020 Medical Rec #:  [id number]              Height:  68.0 in Accession #:    [id number]             Weight:       165.0 lb Date of Birth:  10/06/1941              BSA:          1.883 m Patient Age:    79 years               BP:           152/64 mmHg Patient Gender: F                      HR:           54 bpm. Exam Location:  Inpatient Procedure: 2D Echo STAT ECHO Indications:    Chest Pain R07.9  History:        Patient has prior history of Echocardiogram examinations, most                 recent 11/17/2017. CHF, CAD and Previous Myocardial Infarction;                 Risk Factors:Hypertension and Dyslipidemia.  Sonographer:    Mikki Santee RDCS (AE) Referring Phys: [phone] HAO MENG IMPRESSIONS  1. Left ventricular ejection fraction, by estimation, is 70 to 75%. The left ventricle has hyperdynamic function. The left ventricle has no regional wall motion abnormalities. There is mild left ventricular hypertrophy. Left ventricular diastolic parameters are consistent with Grade II diastolic dysfunction  (pseudonormalization). Elevated left atrial pressure.  2. Right ventricular systolic function is normal. The right ventricular size is normal. There is mildly elevated pulmonary artery systolic pressure. The estimated right ventricular systolic pressure is 67.1 mmHg.  3. Left atrial size was severely dilated.  4. The mitral valve is normal in structure. Mild mitral valve regurgitation.  5. The aortic valve is tricuspid. Aortic valve regurgitation is not visualized. Mild aortic valve sclerosis is present, with no evidence of aortic valve stenosis.  6. Apical hypertrophy with obliteration of LV cavity in systole at apex, concerning for apical HCM, would consider cardiac MRI for further evaluation FINDINGS  Left Ventricle: Left ventricular ejection fraction, by estimation, is 70 to 75%. The left ventricle has hyperdynamic function. The left ventricle has no regional wall motion abnormalities. The left ventricular internal cavity size was normal in size. There is mild left ventricular hypertrophy. Left ventricular diastolic parameters are consistent with Grade II diastolic dysfunction (pseudonormalization). Elevated left atrial pressure. Right Ventricle: The right ventricular size is normal. Right vetricular wall thickness was not assessed. Right ventricular systolic function is normal. There is mildly elevated pulmonary artery systolic pressure. The tricuspid regurgitant velocity is 2.87 m/s, and with an assumed right atrial pressure of 3 mmHg, the estimated right ventricular systolic pressure is 24.5 mmHg. Left Atrium: Left atrial size was severely dilated. Right Atrium: Right atrial size was normal in size. Pericardium: There is no evidence of pericardial effusion. Mitral Valve: The mitral valve is normal in structure. Mild mitral valve regurgitation. Tricuspid Valve: The tricuspid valve is normal in structure. Tricuspid valve regurgitation is trivial. Aortic Valve: The aortic valve is tricuspid. Aortic valve  regurgitation is not visualized. Mild aortic valve sclerosis is present, with no evidence of aortic valve stenosis. Pulmonic Valve: The pulmonic valve was not well visualized. Pulmonic valve regurgitation is trivial. Aorta: The aortic root and ascending aorta are structurally normal, with no evidence of dilitation. IAS/Shunts: The  interatrial septum was not well visualized.  LEFT VENTRICLE PLAX 2D LVIDd:         4.50 cm  Diastology LVIDs:         2.80 cm  LV e' lateral:   7.43 cm/s LV PW:         1.30 cm  LV E/e' lateral: 13.8 LV IVS:        1.10 cm  LV e' medial:    5.25 cm/s LVOT diam:     1.80 cm  LV E/e' medial:  19.5 LV SV:         75 LV SV Index:   40 LVOT Area:     2.54 cm  RIGHT VENTRICLE RV S prime:     11.40 cm/s TAPSE (M-mode): 1.6 cm LEFT ATRIUM              Index       RIGHT ATRIUM           Index LA diam:        4.90 cm  2.60 cm/m  RA Area:     15.00 cm LA Vol (A2C):   108.0 ml 57.35 ml/m RA Volume:   31.60 ml  16.78 ml/m LA Vol (A4C):   107.0 ml 56.82 ml/m LA Biplane Vol: 108.0 ml 57.35 ml/m  AORTIC VALVE LVOT Vmax:   126.00 cm/s LVOT Vmean:  90.900 cm/s LVOT VTI:    0.293 m  AORTA Ao Root diam: 2.40 cm MITRAL VALVE                TRICUSPID VALVE MV Area (PHT): 2.87 cm     TR Peak grad:   32.9 mmHg MV Decel Time: 264 msec     TR Vmax:        287.00 cm/s MV E velocity: 102.50 cm/s MV A velocity: 66.70 cm/s   SHUNTS MV E/A ratio:  1.54         Systemic VTI:  0.29 m                             Systemic Diam: 1.80 cm Oswaldo Milian MD Electronically signed by Oswaldo Milian MD Signature Date/Time: 05/18/2020/10:42:50 AM    Final     Cardiac Studies   Echo 05/18/20 IMPRESSIONS    1. Left ventricular ejection fraction, by estimation, is 70 to 75%. The  left ventricle has hyperdynamic function. The left ventricle has no  regional wall motion abnormalities. There is mild left ventricular  hypertrophy. Left ventricular diastolic  parameters are consistent with Grade II diastolic  dysfunction  (pseudonormalization). Elevated left atrial pressure.  2. Right ventricular systolic function is normal. The right ventricular  size is normal. There is mildly elevated pulmonary artery systolic  pressure. The estimated right ventricular systolic pressure is 93.8 mmHg.  3. Left atrial size was severely dilated.  4. The mitral valve is normal in structure. Mild mitral valve  regurgitation.  5. The aortic valve is tricuspid. Aortic valve regurgitation is not  visualized. Mild aortic valve sclerosis is present, with no evidence of  aortic valve stenosis.  6. Apical hypertrophy with obliteration of LV cavity in systole at apex,  concerning for apical HCM, would consider cardiac MRI for further  evaluation   FINDINGS  Left Ventricle: Left ventricular ejection fraction, by estimation, is 70  to 75%. The left ventricle has hyperdynamic function. The left ventricle  has no regional wall motion abnormalities.  The left ventricular internal  cavity size was normal in size.  There is mild left ventricular hypertrophy. Left ventricular diastolic  parameters are consistent with Grade II diastolic dysfunction  (pseudonormalization). Elevated left atrial pressure.   Right Ventricle: The right ventricular size is normal. Right vetricular  wall thickness was not assessed. Right ventricular systolic function is  normal. There is mildly elevated pulmonary artery systolic pressure. The  tricuspid regurgitant velocity is  2.87 m/s, and with an assumed right atrial pressure of 3 mmHg, the  estimated right ventricular systolic pressure is 04.8 mmHg.   Left Atrium: Left atrial size was severely dilated.   Right Atrium: Right atrial size was normal in size.   Pericardium: There is no evidence of pericardial effusion.   Mitral Valve: The mitral valve is normal in structure. Mild mitral valve  regurgitation.   Tricuspid Valve: The tricuspid valve is normal in structure. Tricuspid   valve regurgitation is trivial.   Patient Profile     79 y.o. female with past medical history of CAD, PAF on Eliquis,HTN,HLD, history of asymptomatic bradycardia, and severe obstructive sleep apnea on CPAPpresented with chest pain and elevated troponin.   Assessment & Plan    1. Chest pain -her symptoms are similar to her CP last year in setting of elevated trop (1.7 at that time) and cath showed no significant CAD and patent LAD stent.  -now with similar CP and similar bump in trop (hsTrop 500 and lower than 1.7 old troponin assay).  -doubt that this is ACS -LVF is normal - Lexiscan myoview. If LVF is reduced then would plan to proceed with LHC. nuc pending - pt is for MRI for possible hocm   2. ASCAD -s/p remote BMS to the LAD in 2000 -cath a year ago for elevated trop and CP showed 20% intimal hyperplasia in LAD stent and 20% LCx.  -continue ASA 81mg  daily and statin  3. HLD -LDL goal < 70 -continue Crestor 40mg  daily -LDL was 53 in Jan 2021  4. PAF -maintaining NSR -continue Tikosyn 188mcg BID -plan to continue Eliquis 5mg  BID for now unless she needs cath  5. HTN -BP controlled -continue spiro 25mg  daily and Atacand (substitute in hospital with Irbesartan 150mg  daily)  6.  CKD stage 3a -creatinine stable at 1.22  For questions or updates, please contact [facility] Please consult [email or web address] for contact info under        Signed, Cecilie Kicks, NP  05/19/2020, 11:03 AM

## 2020-05-19 NOTE — Progress Notes (Signed)
lexiscan stress portion completed nuc to follow.

## 2020-05-19 NOTE — Care Management (Addendum)
[id number] 05-19-20 Case Manager received consult for Tikosyn. Patient is currently on Tikosyn at home. Patient gets a 90 day supply from [facility] for long term medications and her local pharmacy is [facility]. Case Manager will continue to follow for additional transition of care needs. Bethena Roys, RN,BSN Case Manager     [phone] 05-19-20 Patient is from home with support of husband. 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 Patient is doing outpatient therapy at [facility] for right shoulder- s/p rotary cuff repair. Patient uses a cane for support when she goes out of the home. Patient has a friend that drives her to appointments. Bethena Roys, RN,BSN Case Manager

## 2020-05-19 NOTE — Discharge Summary (Signed)
Discharge Summary    Patient ID: Barbara [name],  MRN: [id number], DOB/AGE: 02/22/1941 79 y.o.  Admit date: 05/17/2020 Discharge date: 05/20/2020  Primary Care Provider: Laurey Morale Primary Cardiologist: Shelva Majestic, MD  Discharge Diagnoses    Principal Problem:   Chest pain of uncertain etiology Active Problems:   Elevated troponin   CAD (coronary artery disease)   Apical variant hypertrophic cardiomyopathy ([place])   Essential hypertension   Hyperlipidemia LDL goal <70   PAF (paroxysmal atrial fibrillation) (HCC)   CKD (chronic kidney disease), stage III   Allergies Allergies  Allergen Reactions  . Lidocaine Anaphylaxis  . Morphine Other (See Comments)    "Body shuts down"  . Procaine Hcl Anaphylaxis, Rash and Other (See Comments)    "Anything with 'caine' in it "  . Sulfonamide Derivatives Hives  . Norco [Hydrocodone-Acetaminophen] Other (See Comments)    "Made me sick"  . Tramadol Nausea Only  . Vytorin [Ezetimibe-Simvastatin] Other (See Comments)    Unknown  . Tape Itching and Other (See Comments)    Patient prefers either paper tape or Coban wrap    Diagnostic Studies/Procedures    NST 05/19/20: IMPRESSION: 1. No reversible ischemia or infarction.  2. Septal hypokinesia.  3. Left ventricular ejection fraction 62%  4. Non invasive risk stratification*: Low  Echocardiogram 05/18/20: 1. Left ventricular ejection fraction, by estimation, is 70 to 75%. The  left ventricle has hyperdynamic function. The left ventricle has no  regional wall motion abnormalities. There is mild left ventricular  hypertrophy. Left ventricular diastolic  parameters are consistent with Grade II diastolic dysfunction  (pseudonormalization). Elevated left atrial pressure.  2. Right ventricular systolic function is normal. The right ventricular  size is normal. There is mildly elevated pulmonary artery systolic  pressure. The estimated right ventricular systolic  pressure is 96.2 mmHg.  3. Left atrial size was severely dilated.  4. The mitral valve is normal in structure. Mild mitral valve  regurgitation.  5. The aortic valve is tricuspid. Aortic valve regurgitation is not  visualized. Mild aortic valve sclerosis is present, with no evidence of  aortic valve stenosis.  6. Apical hypertrophy with obliteration of LV cavity in systole at apex,  concerning for apical HCM, would consider cardiac MRI for further  evaluation   Cardiac MRI 05/19/20:  IMPRESSION: 1. LV apical hypertrophy measuring up to 62mm (49mm in basal posterior wall), consistent with apical hypertrophic cardiomyopathy 2. Patchy late gadolinium enhancement at apex and RV insertion site, consistent with apical HCM. LGE accounts for 2% of total myocardial mass 3.  Normal LV size with hyperdynamic systolic function (EF 95%) 4.  Normal RV size with hyperdynamic systolic function (EF 28%) Electronically Signed   By: Oswaldo Milian MD   On: 05/19/2020 16:38  _____________   History of Present Illness     [name] is 79 year old female with past medical history of CAD, PAF on Eliquis, HTN, HLD, history of asymptomatic bradycardia, and severe obstructive sleep apnea on CPAP.  Patient had remote bare-metal stenting of proximal LAD in November 2000.  Last echocardiogram obtained on 11/17/2017 showed EF 70 to 75%, dynamic obstruction at rest in the outflow tract, mild to moderate MR, PA peak pressure 53 mmHg.  Patient was admitted with NSTEMI in April 2020, troponin at the time went up to 1.7 which correspond to 1700 High Sensitivity Troponin.  Patient underwent a cardiac catheterization on 02/26/2019 which showed patent proximal LAD stent with 20% intimal hyperplasia, 20%  proximal left circumflex stenosis, mild 10 to 15% disease in the mid RCA, hyperdynamic LV function with a soft space configuration with near cavity obliteration in the mid to apical segment during systole, LVEDP 16 mmHg.   Since then, patient has been doing well.  She underwent shoulder surgery on 03/23/2020 by Dr. Victorino December.  She has been having trouble lifting her right arm since.  Patient was in her usual state of health until 5:30 PM on the afternoon of 05/16/2020 when she started having heaviness in the substernal area radiating into her throat.  The pain was currently accompanied with shortness of breath however no significant palpitation.  The symptom was similar to what she has experienced in April of last year.  The pain did not go away by the time she went to sleep around 11:30 PM.  However by the time she woke up, the pain did ease off.  She called cardiology service who recommended her to go to the ED.  On arrival, her blood pressure was mildly elevated in the 160s to 170s range.  High-sensitivity troponin was elevated however relatively flat at 510 --> 581.  Hemoglobin was normal.  Creatinine was 1.26 which was her baseline.  Chest x-ray showed no acute process.  EKG showed sinus rhythm with LVH and repolarization abnormality. Cardiology was consulted for chest pain with elevated troponin.  Hospital Course     Consultants: None   1. Chest pain in patient with CAD s/p BMS to LAD in 2000: symptoms were similar to her CP last year in setting of elevated trop (1.7 at that time) and cath showed no significant CAD and patent LAD stent. Now with similar CP and similar bump in trop (hsTrop 500 and lower than 1.7 old troponin assay). Echo this admission showed EF 70-75%, G2DD, severe LAE, mild MR, and apical hypertrophy c/f apical HCM. She underwent a cardiac MRI to evaluate apical hypertrophy.  Cardiac MRI demonstrated findings c/w apical hypertrophic cardiomyopathy with EF 76%. Myoview was low risk without ischemia - Continue aspirin and statin  2. Paroxysmal atrial fibrillation: maintained NSR this admission - Continue tikosyn for rhythm control - Continue eliquis for stroke ppx  3. HTN: BP stable -   Continue home medications  4. HLD: LDL 53 11/2019, at goal < 70 - Continue Crestor 40mg  daily  5. CKD stage 3a: Creatinine stable at 1.22 - Continue to monitor routinely outpatient _____________  Discharge Vitals Blood pressure (!) 117/48, pulse (!) 58, temperature 97.9 F (36.6 C), temperature source Oral, resp. rate 16, height 5\' 8"  (1.727 m), weight 77.5 kg, SpO2 99 %.  Filed Weights   05/17/20 1545 05/18/20 1550  Weight: 74.8 kg 77.5 kg    Labs & Radiologic Studies    CBC No results for input(s): WBC, NEUTROABS, HGB, HCT, MCV, PLT in the last 72 hours. Basic Metabolic Panel Recent Labs    05/19/20 0429 05/20/20 1123  NA 137 136  K 4.4 4.2  CL 102 103  CO2 24 23  GLUCOSE 100* 92  BUN 22 23  CREATININE 1.33* 1.33*  CALCIUM 10.3 10.4*  MG 2.2 2.1   Recent Labs  Lab 05/17/20 1209 05/17/20 1513 05/17/20 1807 05/17/20 2117  TROPONINIHS 510* 581* 543* 533*   _____________  DG Chest 2 View  Result Date: 05/17/2020 CLINICAL DATA:  Chest pain and shortness of breath. EXAM: CHEST - 2 VIEW COMPARISON:  Chest x-ray dated February 25, 2019. FINDINGS: Stable cardiomediastinal silhouette. Normal pulmonary vascularity. No focal consolidation,  pleural effusion, or pneumothorax. No acute osseous abnormality. Interval right reverse total shoulder arthroplasty. IMPRESSION: 1. No acute cardiopulmonary disease. Electronically Signed   By: Titus Dubin M.D.   On: 05/17/2020 12:47    Disposition   The patient was to be discharged on 7/16, but she had some transportation issues.  She was seen by Dr. Radford Pax this morning and deemed stable for discharge. Follow-up has been arranged. Discharge medications as listed below.   Follow-up Plans & Appointments     Follow-up Information    Imogene Burn, PA-C Follow up on 05/30/2020.   Specialty: Cardiology Why: Please arrive 15 minutes early for your 11:45am post-hospital cardiology follow-up appointment.  Contact information: [address] [place] [address] [phone]              Discharge Instructions    Diet - low sodium heart healthy   Complete by: As directed    Increase activity slowly   Complete by: As directed       Discharge Medications   Allergies as of 05/20/2020      Reactions   Lidocaine Anaphylaxis   Morphine Other (See Comments)   "Body shuts down"   Procaine Hcl Anaphylaxis, Rash, Other (See Comments)   "Anything with 'caine' in it "   Sulfonamide Derivatives Hives   Norco [hydrocodone-acetaminophen] Other (See Comments)   "Made me sick"   Tramadol Nausea Only   Vytorin [ezetimibe-simvastatin] Other (See Comments)   Unknown   Tape Itching, Other (See Comments)   Patient prefers either paper tape or Coban wrap      Medication List    STOP taking these medications   HYDROcodone-acetaminophen 5-325 MG tablet Commonly known as: Norco     TAKE these medications   acetaminophen 500 MG tablet Commonly known as: TYLENOL Take 1,000 mg by mouth every 6 (six) hours as needed for mild pain or headache.   Alaway 0.025 % ophthalmic solution Generic drug: ketotifen Place 1 drop into both eyes 2 (two) times daily as needed (eye irritation.).   apixaban 5 MG Tabs tablet Commonly known as: Eliquis Take 1 tablet (5 mg total) by mouth 2 (two) times daily.   aspirin 81 MG EC tablet Take 1 tablet (81 mg total) by mouth daily. Swallow whole.   Biotin 5000 MCG Subl Place 10,000 mcg under the tongue daily.   candesartan 16 MG tablet Commonly known as: ATACAND Take 1 tablet (16 mg total) by mouth daily.   dextromethorphan 30 MG/5ML liquid Commonly known as: DELSYM Take 15 mg by mouth daily as needed for cough.   dofetilide 125 MCG capsule Commonly known as: TIKOSYN Take 1 capsule (125 mcg total) by mouth 2 (two) times daily.   HOMEOPATHIC PRODUCTS PO Place 2 tablets under the tongue See admin instructions. MagniLife Pain and Fatigue Relief (Aconitum  napellus 30C HPUS Arsenicum album 30C HPUS Belladonna 30C HPUS Coniinum 30C HPUS Gelsemium sempervirens 30CHPUS Hypericum perforatum 3X, 30C HPUS Kali bichromicum 30C Phosphoricum acidum 30CHPUS Rhus tox 30C HPUS Uricum acidum 30C HPUS Lacticum acidum 30C HPUS Lactose, Magnesium Stearate, Microcrystalline Cellulose)- Dissolve 2 tablets under the tongue three times a day   ipratropium 0.06 % nasal spray Commonly known as: ATROVENT Place 2 sprays into both nostrils daily as needed for rhinitis.   loratadine 10 MG tablet Commonly known as: CLARITIN Take 10 mg by mouth daily as needed (allergies/sinus issues).   methocarbamol 500 MG tablet Commonly known as: Robaxin Take 1 tablet (  500 mg total) by mouth every 6 (six) hours as needed for muscle spasms.   multivitamin with minerals Tabs tablet Take 1 tablet by mouth daily.   nitroGLYCERIN 0.4 MG SL tablet Commonly known as: NITROSTAT Place 1 tablet (0.4 mg total) under the tongue every 5 (five) minutes x 3 doses as needed for chest pain.   omeprazole 20 MG tablet Commonly known as: PRILOSEC OTC Take 20 mg by mouth daily as needed (for acid reflux).   PRESCRIPTION MEDICATION See admin instructions. CPAP- At bedtime   rosuvastatin 40 MG tablet Commonly known as: CRESTOR Take 1 tablet (40 mg total) by mouth daily. What changed: when to take this   spironolactone 25 MG tablet Commonly known as: ALDACTONE Take 1 tablet (25 mg total) by mouth daily.   triamcinolone 55 MCG/ACT nasal inhaler Commonly known as: NASACORT Place 1 spray into both nostrils daily as needed (for allergies).   Vista Gel Dry Eye Relief 0.3 % Gel ophthalmic ointment Generic drug: hypromellose Place 1 application into both eyes at bedtime.   Vitamin D3 50 MCG (2000 UT) Tabs Take 2,000 Units by mouth daily.        Outstanding Labs/Studies   None  Duration of Discharge Encounter   Greater than 30 minutes including physician time.  Signed, Richardson Dopp, PA-C    05/20/2020 1:09 PM

## 2020-05-19 NOTE — Progress Notes (Signed)
Pharmacy: Dofetilide (Tikosyn) - Follow Up Assessment and Electrolyte Replacement  Pharmacy consulted to assist in monitoring and replacing electrolytes in this 79 y.o. female admitted on 05/17/2020 continuing dofetilide from PTA 125 mcg BID  Labs:    Component Value Date/Time   K 4.4 05/19/2020 0429   MG 2.2 05/19/2020 0429     Plan: Potassium: K >/= 4: No additional supplementation needed  Magnesium: Mg > 2: No additional supplementation needed   Thank you for allowing pharmacy to participate in this patient's care   Cristela Felt, PharmD Clinical Pharmacist  05/19/2020  7:58 AM

## 2020-05-20 DIAGNOSIS — I110 Hypertensive heart disease with heart failure: Secondary | ICD-10-CM | POA: Diagnosis not present

## 2020-05-20 DIAGNOSIS — R079 Chest pain, unspecified: Secondary | ICD-10-CM | POA: Diagnosis not present

## 2020-05-20 DIAGNOSIS — I2510 Atherosclerotic heart disease of native coronary artery without angina pectoris: Secondary | ICD-10-CM | POA: Diagnosis not present

## 2020-05-20 DIAGNOSIS — I421 Obstructive hypertrophic cardiomyopathy: Secondary | ICD-10-CM

## 2020-05-20 DIAGNOSIS — R778 Other specified abnormalities of plasma proteins: Secondary | ICD-10-CM | POA: Diagnosis not present

## 2020-05-20 DIAGNOSIS — N1832 Chronic kidney disease, stage 3b: Secondary | ICD-10-CM

## 2020-05-20 DIAGNOSIS — I10 Essential (primary) hypertension: Secondary | ICD-10-CM | POA: Diagnosis not present

## 2020-05-20 DIAGNOSIS — I214 Non-ST elevation (NSTEMI) myocardial infarction: Secondary | ICD-10-CM | POA: Diagnosis not present

## 2020-05-20 DIAGNOSIS — I509 Heart failure, unspecified: Secondary | ICD-10-CM | POA: Diagnosis not present

## 2020-05-20 DIAGNOSIS — I422 Other hypertrophic cardiomyopathy: Secondary | ICD-10-CM

## 2020-05-20 DIAGNOSIS — N183 Chronic kidney disease, stage 3 unspecified: Secondary | ICD-10-CM

## 2020-05-20 LAB — BASIC METABOLIC PANEL
Anion gap: 10 (ref 5–15)
BUN: 23 mg/dL (ref 8–23)
CO2: 23 mmol/L (ref 22–32)
Calcium: 10.4 mg/dL — ABNORMAL HIGH (ref 8.9–10.3)
Chloride: 103 mmol/L (ref 98–111)
Creatinine, Ser: 1.33 mg/dL — ABNORMAL HIGH (ref 0.44–1.00)
GFR calc Af Amer: 43 mL/min — ABNORMAL LOW (ref >60)
GFR calc non Af Amer: 37 mL/min — ABNORMAL LOW (ref >60)
Glucose, Bld: 92 mg/dL (ref 70–99)
Potassium: 4.2 mmol/L (ref 3.5–5.1)
Sodium: 136 mmol/L (ref 135–145)

## 2020-05-20 LAB — MAGNESIUM: Magnesium: 2.1 mg/dL (ref 1.7–2.4)

## 2020-05-20 NOTE — Progress Notes (Signed)
Pt given discharge summary and discharged via daughter as transportation.  

## 2020-05-20 NOTE — Progress Notes (Signed)
Pharmacy: Dofetilide (Tikosyn) - Follow Up Assessment and Electrolyte Replacement  Pharmacy consulted to assist in monitoring and replacing electrolytes in this 80 y.o. female admitted on 05/17/2020 continuing dofetilide from PTA 125 mcg BID  Labs:    Component Value Date/Time   K 4.2 05/20/2020 1123   MG 2.1 05/20/2020 1123     Plan: Potassium: K >/= 4: No additional supplementation needed  Magnesium: Mg > 2: No additional supplementation needed   Thank you for allowing pharmacy to participate in this patient's care   Cephus Slater, PharmD, [place] Resident [phone] 05/20/2020 12:48 PM  05/20/2020  12:48 PM

## 2020-05-22 NOTE — Telephone Encounter (Signed)
**Note De-Identified  Obfuscation** Patient contacted regarding discharge from [facility] on 05/20/2020.  Patient understands to follow up with provider Ermalinda Barrios, PA-c  on 05/30/2020 at 11:45 at [facility] in [place], [place] [address]. Patient understands discharge instructions? Yes Patient understands medications and regiment? Yes Patient understands to bring all medications to this visit? Yes  Ask patient:  Are you enrolled in My Chart: Yes

## 2020-05-29 ENCOUNTER — Telehealth: Payer: Self-pay | Admitting: Cardiovascular Disease

## 2020-05-29 NOTE — Telephone Encounter (Signed)
PA started in cover my meds

## 2020-05-29 NOTE — Telephone Encounter (Signed)
*  STAT* If patient is at the pharmacy, call can be transferred to refill team.   1. Which medications need to be refilled? (please list name of each medication and dose if known)? dofetilide (TIKOSYN) 125 MCG capsule  2. Which pharmacy/location (including street and city if local pharmacy) is medication to be sent to? [facility] [place]. [facility] [address]. Phone number. [phone]    3. Do they need a 30 day or 90 day supply? 90 day    Patient states she will be out of her medication on Wednesday 7/28.  New Pharmacy To Be Added: [facility] [address] [place]. [place] Phone number. [phone]

## 2020-05-29 NOTE — Progress Notes (Signed)
Cardiology Office Note    Date:  05/30/2020   ID:  Barbara [name], DOB Sep 02, 1941, MRN [id number]  PCP:  Laurey Morale, MD  Cardiologist: Shelva Majestic, MD EPS: None  Chief Complaint  Patient presents with  . Hospitalization Follow-up    History of Present Illness:  Barbara [name] is a 79 y.o. female with past medical history of CAD-remote BMS to the LAD 09/1999, NSTEMI 02/2020 cardiac cath 02/26/2019 patent proximal LAD stent with 20% intimal hyperplasia, 20% proximal circumflex, mild 10 to 15% mid RCA., PAF on Eliquis, HTN, HLD, history of asymptomatic bradycardia, and severe obstructive sleep apnea on CPAP   Patient readmitted with chest pain with similar bump in troponins HiSpeed troponin V 100 and lower than 1.7 old troponin assay.  Echo EF 70-75% with grade 2 DD, severe LAE, mild MR, apical hypertrophy consistent with apical HCM.  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  Myoview low risk of ischemia.  Patient comes in for f/u. Complains of dyspnea on exertion with little activity and fatigue.Denies chest pain, palpitations, dizziness or presyncope.    Past Medical History:  Diagnosis Date  . A-fib ([facility])   . Allergy   . CAD (coronary artery disease)    sees Dr. Shelva Majestic  cardiac stents - 2000  . CHF (congestive heart failure) ([place])   . Colon polyps   . Complication of anesthesia    rash/hives with "caines"  . Dyspnea    02/12/18 " when my heart gets out of rhythm, it has not been out of rhythym- since I have been on Tikosyn (11/2017)  . Dysrhythmia    afib fib  . GERD (gastroesophageal reflux disease)    takes OTC- Omeprazole- prn  . Heart murmur   . History of stress test    show normal perfusion without scar or ischemia, post EF 68%  . Hx of echocardiogram    show an EF 55%-60% range with grade 1 diastolic dysfunction, she had mitral anular calcification with mild MR, moderate LA dilation and mild pulmonary hypertension with  a PA estimated pressure of 28m  . Hyperlipidemia   . Hypertension   . NSTEMI (non-ST elevated myocardial infarction) ([place]   . Osteoarthritis   . Pneumonia    hx of 2015   . PONV (postoperative nausea and vomiting)     Past Surgical History:  Procedure Laterality Date  . CARDIAC CATHETERIZATION     11/2017  . cardiac stents  2000  . COLONOSCOPY  01-05-14   per Dr. JTeena Irani clear, no repeats needed   . COLONOSCOPY    . CORONARY STENT PLACEMENT  2000   in LAD  . DIRECT LARYNGOSCOPY WITH RADIAESSE INJECTION N/A 02/13/2018   Procedure: DIRECT LARYNGOSCOPY WITH RADIAESSE INJECTION;  Surgeon: BMelida Quitter MD;  [facility]  Service: ENT;  Laterality: N/A;  . EYE SURGERY Left    CATARACT REMOVAL  . KNEE ARTHROSCOPY Left 01/06/2015   Procedure: LEFT KNEE ARTHROSCOPY, abrasion chondroplasty of the medial femerol condryl,medial and lateral menisectomy, microfracture , synovectomy of the suprpatellar pouch;  Surgeon: RLatanya Maudlin MD;  Location: [facility];  Service: Orthopedics;  Laterality: Left;  . LEFT HEART CATH AND CORONARY ANGIOGRAPHY N/A 02/26/2019   Procedure: LEFT HEART CATH AND CORONARY ANGIOGRAPHY;  Surgeon: KTroy Sine MD;  [facility];  Service: Cardiovascular;  Laterality: N/A;  . MICROLARYNGOSCOPY W/VOCAL CORD INJECTION N/A 08/07/2018   Procedure: MICROLARYNGOSCOPY WITH VOCAL CORD INJECTION OF PROLARYN;  Surgeon: Melida Quitter, MD;  Location: [facility];  Service: ENT;  Laterality: N/A;  JET VENTILATION  . REVERSE SHOULDER ARTHROPLASTY Right 03/23/2020   Procedure: REVERSE SHOULDER ARTHROPLASTY;  Surgeon: Nicholes Stairs, MD;  Location: [facility];  Service: Orthopedics;  Laterality: Right;  Marland Kitchen VAGINAL HYSTERECTOMY  1971    Current Medications: Current Meds  Medication Sig  . acetaminophen (TYLENOL) 500 MG tablet Take 1,000 mg by mouth every 6 (six) hours as needed for mild pain or headache.  Marland Kitchen apixaban (ELIQUIS) 5 MG TABS tablet Take 1 tablet (5 mg total)  by mouth 2 (two) times daily.  Marland Kitchen aspirin EC 81 MG EC tablet Take 1 tablet (81 mg total) by mouth daily. Swallow whole.  . Biotin 5000 MCG SUBL Place 10,000 mcg under the tongue daily.  . candesartan (ATACAND) 16 MG tablet Take 1 tablet (16 mg total) by mouth daily.  . Cholecalciferol (VITAMIN D3) 2000 units TABS Take 2,000 Units by mouth daily.  Marland Kitchen dextromethorphan (DELSYM) 30 MG/5ML liquid Take 15 mg by mouth daily as needed for cough.   . dofetilide (TIKOSYN) 125 MCG capsule Take 1 capsule (125 mcg total) by mouth 2 (two) times daily.  Marland Kitchen HOMEOPATHIC PRODUCTS PO Place 2 tablets under the tongue See admin instructions. MagniLife Pain and Fatigue Relief (Aconitum napellus 30C HPUS Arsenicum album 30C HPUS Belladonna 30C HPUS Coniinum 30C HPUS Gelsemium sempervirens 30CHPUS Hypericum perforatum 3X, 30C HPUS Kali bichromicum 30C Phosphoricum acidum 30CHPUS Rhus tox 30C HPUS Uricum acidum 30C HPUS Lacticum acidum 30C HPUS Lactose, Magnesium Stearate, Microcrystalline Cellulose)- Dissolve 2 tablets under the tongue three times a day  . hypromellose (VISTA GEL DRY EYE RELIEF) 0.3 % GEL ophthalmic ointment Place 1 application into both eyes at bedtime.  Marland Kitchen ipratropium (ATROVENT) 0.06 % nasal spray Place 2 sprays into both nostrils daily as needed for rhinitis.   Marland Kitchen ketotifen (ALAWAY) 0.025 % ophthalmic solution Place 1 drop into both eyes 2 (two) times daily as needed (eye irritation.).  Marland Kitchen loratadine (CLARITIN) 10 MG tablet Take 10 mg by mouth daily as needed (allergies/sinus issues).   . methocarbamol (ROBAXIN) 500 MG tablet Take 1 tablet (500 mg total) by mouth every 6 (six) hours as needed for muscle spasms.  . Multiple Vitamin (MULTIVITAMIN WITH MINERALS) TABS tablet Take 1 tablet by mouth daily.  . nitroGLYCERIN (NITROSTAT) 0.4 MG SL tablet Place 1 tablet (0.4 mg total) under the tongue every 5 (five) minutes x 3 doses as needed for chest pain.  Marland Kitchen omeprazole (PRILOSEC OTC) 20 MG tablet Take 20 mg by mouth  daily as needed (for acid reflux).   Marland Kitchen PRESCRIPTION MEDICATION See admin instructions. CPAP- At bedtime  . rosuvastatin (CRESTOR) 40 MG tablet Take 1 tablet (40 mg total) by mouth daily.  Marland Kitchen spironolactone (ALDACTONE) 25 MG tablet Take 1 tablet (25 mg total) by mouth daily.  Marland Kitchen triamcinolone (NASACORT) 55 MCG/ACT nasal inhaler Place 1 spray into both nostrils daily as needed (for allergies).      Allergies:   Lidocaine, Morphine, Procaine hcl, Sulfonamide derivatives, Norco [hydrocodone-acetaminophen], Tramadol, Vytorin [ezetimibe-simvastatin], and Tape   Social History   Socioeconomic History  . Marital status: Married    Spouse name: Not on file  . Number of children: 2  . Years of education: Not on file  . Highest education level: Not on file  Occupational History  . Not on file  Tobacco Use  . Smoking status: Former Smoker    Years: 40.00    Quit  date: 11/04/1996    Years since quitting: 23.5  . Smokeless tobacco: Never Used  Vaping Use  . Vaping Use: Never used  Substance and Sexual Activity  . Alcohol use: No    Alcohol/week: 0.0 standard drinks  . Drug use: No  . Sexual activity: Not on file  Other Topics Concern  . Not on file  Social History Narrative  . Not on file   Social Determinants of Health   Financial Resource Strain:   . Difficulty of Paying Living Expenses:   Food Insecurity:   . Worried About Charity fundraiser in the Last Year:   . Arboriculturist in the Last Year:   Transportation Needs:   . Film/video editor (Medical):   Marland Kitchen Lack of Transportation (Non-Medical):   Physical Activity:   . Days of Exercise per Week:   . Minutes of Exercise per Session:   Stress:   . Feeling of Stress :   Social Connections:   . Frequency of Communication with Friends and Family:   . Frequency of Social Gatherings with Friends and Family:   . Attends Religious Services:   . Active Member of Clubs or Organizations:   . Attends Archivist Meetings:     Marland Kitchen Marital Status:      Family History:  The patient's family history includes Cancer in her maternal grandmother; Heart disease in her maternal grandfather.   ROS:   Please see the history of present illness.    ROS All other systems reviewed and are negative.   PHYSICAL EXAM:   VS:  BP (!) 146/50   Pulse 58   Ht _0  (1.727 m)   Wt 170 lb 12.8 oz (77.5 kg)   SpO2 97%   BMI 25.97 kg/m   Physical Exam  GEN: Well nourished, well developed, in no acute distress  Neck: no JVD, carotid bruits, or masses Cardiac:RRR; 2/6 to 3/6 systolic murmur at the left sternal border Respiratory:  clear to auscultation bilaterally, normal work of breathing GI: soft, nontender, nondistended, + BS Ext: without cyanosis, clubbing, or edema, Good distal pulses bilaterally Neuro:  Alert and Oriented x 3 Psych: euthymic mood, full affect  Wt Readings from Last 3 Encounters:  05/30/20 170 lb 12.8 oz (77.5 kg)  05/18/20 170 lb 13.7 oz (77.5 kg)  03/31/20 170 lb 12.8 oz (77.5 kg)      Studies/Labs Reviewed:   EKG:  EKG is not ordered today.    Recent Labs: 10/26/2019: TSH 0.994 12/02/2019: ALT 24 05/17/2020: Hemoglobin 13.4; Platelets 226 05/20/2020: BUN 23; Creatinine, Ser 1.33; Magnesium 2.1; Potassium 4.2; Sodium 136   Lipid Panel    Component Value Date/Time   CHOL 129 12/02/2019 1541   TRIG 80.0 12/02/2019 1541   HDL 60.10 12/02/2019 1541   CHOLHDL 2 12/02/2019 1541   VLDL 16.0 12/02/2019 1541   LDLCALC 53 12/02/2019 1541    Additional studies/ records that were reviewed today include:  NST 05/19/20: IMPRESSION: 1. No reversible ischemia or infarction.   2. Septal hypokinesia.   3. Left ventricular ejection fraction 62%   4. Non invasive risk stratification*: Low   Echocardiogram 05/18/20: 1. Left ventricular ejection fraction, by estimation, is 70 to 75%. The  left ventricle has hyperdynamic function. The left ventricle has no  regional wall motion abnormalities. There is  mild left ventricular  hypertrophy. Left ventricular diastolic  parameters are consistent with Grade II diastolic dysfunction  (pseudonormalization). Elevated left atrial pressure.  2. Right ventricular systolic function is normal. The right ventricular  size is normal. There is mildly elevated pulmonary artery systolic  pressure. The estimated right ventricular systolic pressure is 14.4 mmHg.   3. Left atrial size was severely dilated.   4. The mitral valve is normal in structure. Mild mitral valve  regurgitation.   5. The aortic valve is tricuspid. Aortic valve regurgitation is not  visualized. Mild aortic valve sclerosis is present, with no evidence of  aortic valve stenosis.   6. Apical hypertrophy with obliteration of LV cavity in systole at apex,  concerning for apical HCM, would consider cardiac MRI for further  evaluation    Cardiac MRI 05/19/20:  IMPRESSION: 1. LV apical hypertrophy measuring up to 8m (763min basal posterior wall), consistent with apical hypertrophic cardiomyopathy 2. Patchy late gadolinium enhancement at apex and RV insertion site, consistent with apical HCM. LGE accounts for 2% of total myocardial mass 3.  Normal LV size with hyperdynamic systolic function (EF 7631%4.  Normal RV size with hyperdynamic systolic function (EF 7854%Electronically Signed   By: ChOswaldo MilianD   On: 05/19/2020 16:38  _____________     ASSESSMENT:    1. Coronary artery disease involving native coronary artery of native heart without angina pectoris   2. Apical variant hypertrophic cardiomyopathy ([facility]  3. PAF (paroxysmal atrial fibrillation) ([facility]  4. Essential hypertension   5. Hyperlipidemia, unspecified hyperlipidemia type      PLAN:  In order of problems listed above:  Recent admission with chest pain with history of BMS to the LAD in 2000, patent on cath 02/2020.  NST 05/19/2020 no reversible ischemia or infarction.  Echo normal LVEF 70 to 75% with grade 2 DD  and apical hypertrophy consistent with apical HCM.  No recurrent chest pain with chronic dyspnea on exertion.  Hypertrophic cardiomyopathy EF 76% on MRI for work-up by Dr. KeClaiborne Billings Dr. TuRadford Paxad wanted to increase her candesartan to 32 mg daily but this was not done so we will ask her to do this now and check be met in 2 weeks.  PAF on Tikosyn and Eliquis  Hypertension blood pressure stable will increase candesartan to 32 mg once daily  Hyperlipidemia LDL 53 12/02/2019  CKD stage III creatinine 1.33 05/20/2020 follow-up be met in 2 weeks after increase candesartan   Medication Adjustments/Labs and Tests Ordered: Current medicines are reviewed at length with the patient today.  Concerns regarding medicines are outlined above.  Medication changes, Labs and Tests ordered today are listed in the Patient Instructions below. Patient Instructions  Medication Instructions:  Your physician has recommended you make the following change in your medication:   START taking Pantoprazole 4018maily Make sure you are taking Candesartan 42m72mily  *If you need a refill on your cardiac medications before your next appointment, please call your pharmacy*   Lab Work: BMET on 06/13/2020  If you have labs (blood work) drawn today and your tests are completely normal, you will receive your results only by: . MyMarland Kitchenhart Message (if you have MyChart) OR . A paper copy in the mail If you have any lab test that is abnormal or we need to change your treatment, we will call you to review the results.   Testing/Procedures: None   Follow-Up: [facility] and [facility] needs are our priority.  As part of our continuing mission to provide you with exceptional heart care, we have created designated Provider Care  Teams.  These Care Teams include your primary Cardiologist (physician) and Advanced Practice Providers (APPs -  Physician Assistants and Nurse Practitioners) who all work together to provide you  with the care you need, when you need it.  We recommend signing up for the patient portal called "MyChart".  Sign up information is provided on this After Visit Summary.  MyChart is used to connect with patients for Virtual Visits (Telemedicine).  Patients are able to view lab/test results, encounter notes, upcoming appointments, etc.  Non-urgent messages can be sent to your provider as well.   To learn more about what you can do with MyChart, go to [email or web address].    Your next appointment:   07/05/2020   The format for your next appointment:   In Person  Provider:   Shelva Majestic, MD   Other Instructions None     Signed, Ermalinda Barrios, PA-C  05/30/2020 12:37 PM    [facility] [address], [place]  [address] Phone: [phone]; Fax: [phone]

## 2020-05-30 ENCOUNTER — Other Ambulatory Visit: Payer: Self-pay

## 2020-05-30 ENCOUNTER — Ambulatory Visit (INDEPENDENT_AMBULATORY_CARE_PROVIDER_SITE_OTHER): Payer: Medicare Other | Admitting: Physician Assistant

## 2020-05-30 ENCOUNTER — Encounter: Payer: Self-pay | Admitting: Physician Assistant

## 2020-05-30 ENCOUNTER — Telehealth: Payer: Self-pay | Admitting: Physician Assistant

## 2020-05-30 VITALS — BP 146/50 | HR 58 | Ht 68.0 in | Wt 170.8 lb

## 2020-05-30 DIAGNOSIS — I422 Other hypertrophic cardiomyopathy: Secondary | ICD-10-CM

## 2020-05-30 DIAGNOSIS — I10 Essential (primary) hypertension: Secondary | ICD-10-CM

## 2020-05-30 DIAGNOSIS — I480 Paroxysmal atrial fibrillation: Secondary | ICD-10-CM | POA: Diagnosis not present

## 2020-05-30 DIAGNOSIS — I2510 Atherosclerotic heart disease of native coronary artery without angina pectoris: Secondary | ICD-10-CM

## 2020-05-30 DIAGNOSIS — E785 Hyperlipidemia, unspecified: Secondary | ICD-10-CM

## 2020-05-30 MED ORDER — PANTOPRAZOLE SODIUM 40 MG PO TBEC
40.0000 mg | DELAYED_RELEASE_TABLET | Freq: Every day | ORAL | 11 refills | Status: DC
Start: 2020-05-30 — End: 2020-08-23

## 2020-05-30 NOTE — Patient Instructions (Signed)
Medication Instructions:  Your physician has recommended you make the following change in your medication:   START taking Pantoprazole 40mg  daily Make sure you are taking Candesartan 32mg  daily  *If you need a refill on your cardiac medications before your next appointment, please call your pharmacy*   Lab Work: BMET on 06/13/2020  If you have labs (blood work) drawn today and your tests are completely normal, you will receive your results only by: Marland Kitchen MyChart Message (if you have MyChart) OR . A paper copy in the mail If you have any lab test that is abnormal or we need to change your treatment, we will call you to review the results.   Testing/Procedures: None   Follow-Up: At [facility], you and your health needs are our priority.  As part of our continuing mission to provide you with exceptional heart care, we have created designated Provider Care Teams.  These Care Teams include your primary Cardiologist (physician) and Advanced Practice Providers (APPs -  Physician Assistants and Nurse Practitioners) who all work together to provide you with the care you need, when you need it.  We recommend signing up for the patient portal called "MyChart".  Sign up information is provided on this After Visit Summary.  MyChart is used to connect with patients for Virtual Visits (Telemedicine).  Patients are able to view lab/test results, encounter notes, upcoming appointments, etc.  Non-urgent messages can be sent to your provider as well.   To learn more about what you can do with MyChart, go to [email or web address].    Your next appointment:   07/05/2020   The format for your next appointment:   In Person  Provider:   Shelva Majestic, MD   Other Instructions None

## 2020-05-30 NOTE — Telephone Encounter (Signed)
Barbara [name] from [facility] is calling wanting to make sure it is okay for Barbara [name] to continue therapy services with their office at this time due to her recent hospital visit where she was kept for 5 days. He states is he is unable to answer when calling back please leave a detailed message.

## 2020-05-31 NOTE — Telephone Encounter (Signed)
Can you let Louie Casa from [facility] know that this patient can continue with therapy. I believe it's on her shoulder. If she gets short of breath she needs to pace herself and listen to her body. Thanks

## 2020-05-31 NOTE — Telephone Encounter (Signed)
Left detailed message for Barbara [name] with these instructions.

## 2020-06-02 ENCOUNTER — Telehealth: Payer: Self-pay | Admitting: Family Medicine

## 2020-06-02 NOTE — Telephone Encounter (Signed)
Spoke with patient she state she will call back on 06/05/20 she did not have her calendar

## 2020-06-05 ENCOUNTER — Telehealth: Payer: Self-pay

## 2020-06-05 ENCOUNTER — Other Ambulatory Visit: Payer: Self-pay

## 2020-06-05 MED ORDER — DOFETILIDE 125 MCG PO CAPS: 125.0000 ug | ORAL_CAPSULE | Freq: Two times a day (BID) | ORAL | 1 refills | Status: DC

## 2020-06-05 NOTE — Telephone Encounter (Signed)
Pt called in stating that they need a prior authorization for tikosyn and that they are out of the med [phone] for pa. Will route to dr [facility] nurse so that it can get handled asap

## 2020-06-05 NOTE — Telephone Encounter (Signed)
Called and spoke with PA rep from [employer]. Able to do PA over the phone and rep states it will be APPROVED and they will fax a confirmation.

## 2020-06-05 NOTE — Telephone Encounter (Signed)
Called and spoke with pt notified that I spoke with a rep from [facility] and was notified that they would approve the tikosyn and that they will be sending a fax. Pt stated that they said this the last time but she had not heard anything. Notified I would contact the pharmacy to see if they would start to process the refill. Advised pt that if she didn't have her medication within the next few days to contact our office again. Pt verbalized understanding with no other questions at this time.  Attempted to contact [employer]. Unable to speak with a representative at this time.

## 2020-06-09 NOTE — Telephone Encounter (Signed)
Attempted to call pt x2, phone busy tone each time. Wanted to notify pt that her Tikosyn prior authorization was approved from 06/05/2020-06/05/2021.

## 2020-06-13 ENCOUNTER — Other Ambulatory Visit: Payer: Medicare Other

## 2020-06-13 ENCOUNTER — Other Ambulatory Visit: Payer: Self-pay

## 2020-06-13 DIAGNOSIS — I480 Paroxysmal atrial fibrillation: Secondary | ICD-10-CM

## 2020-06-13 DIAGNOSIS — I10 Essential (primary) hypertension: Secondary | ICD-10-CM

## 2020-06-13 DIAGNOSIS — I2510 Atherosclerotic heart disease of native coronary artery without angina pectoris: Secondary | ICD-10-CM

## 2020-06-13 DIAGNOSIS — I422 Other hypertrophic cardiomyopathy: Secondary | ICD-10-CM

## 2020-06-13 LAB — BASIC METABOLIC PANEL
BUN/Creatinine Ratio: 20 (ref 12–28)
BUN: 30 mg/dL — ABNORMAL HIGH (ref 8–27)
CO2: 21 mmol/L (ref 20–29)
Calcium: 10.4 mg/dL — ABNORMAL HIGH (ref 8.7–10.3)
Chloride: 104 mmol/L (ref 96–106)
Creatinine, Ser: 1.52 mg/dL — ABNORMAL HIGH (ref 0.57–1.00)
GFR calc Af Amer: 37 mL/min/{1.73_m2} — ABNORMAL LOW (ref >59)
GFR calc non Af Amer: 32 mL/min/{1.73_m2} — ABNORMAL LOW (ref >59)
Glucose: 93 mg/dL (ref 65–99)
Potassium: 5.3 mmol/L — ABNORMAL HIGH (ref 3.5–5.2)
Sodium: 138 mmol/L (ref 134–144)

## 2020-06-14 ENCOUNTER — Other Ambulatory Visit: Payer: Self-pay

## 2020-06-14 DIAGNOSIS — I5032 Chronic diastolic (congestive) heart failure: Secondary | ICD-10-CM

## 2020-06-14 DIAGNOSIS — I509 Heart failure, unspecified: Secondary | ICD-10-CM

## 2020-06-22 ENCOUNTER — Other Ambulatory Visit: Payer: Medicare Other | Admitting: *Deleted

## 2020-06-22 ENCOUNTER — Other Ambulatory Visit: Payer: Self-pay

## 2020-06-22 DIAGNOSIS — I5032 Chronic diastolic (congestive) heart failure: Secondary | ICD-10-CM

## 2020-06-22 LAB — BASIC METABOLIC PANEL
BUN/Creatinine Ratio: 15 (ref 12–28)
BUN: 22 mg/dL (ref 8–27)
CO2: 23 mmol/L (ref 20–29)
Calcium: 10.2 mg/dL (ref 8.7–10.3)
Chloride: 104 mmol/L (ref 96–106)
Creatinine, Ser: 1.42 mg/dL — ABNORMAL HIGH (ref 0.57–1.00)
GFR calc Af Amer: 40 mL/min/{1.73_m2} — ABNORMAL LOW (ref >59)
GFR calc non Af Amer: 35 mL/min/{1.73_m2} — ABNORMAL LOW (ref >59)
Glucose: 90 mg/dL (ref 65–99)
Potassium: 4.8 mmol/L (ref 3.5–5.2)
Sodium: 138 mmol/L (ref 134–144)

## 2020-06-25 ENCOUNTER — Other Ambulatory Visit: Payer: Self-pay | Admitting: Cardiovascular Disease

## 2020-07-04 ENCOUNTER — Telehealth: Payer: Self-pay | Admitting: Family Medicine

## 2020-07-04 ENCOUNTER — Ambulatory Visit: Payer: Medicare Other

## 2020-07-04 ENCOUNTER — Other Ambulatory Visit: Payer: Self-pay

## 2020-07-04 NOTE — Telephone Encounter (Signed)
I called to do medicare wellness visit with patient and she informed me that she would need to cancel her appointment as she forgot it was today. She stated that she would call the front desk and reschedule

## 2020-07-05 ENCOUNTER — Ambulatory Visit (INDEPENDENT_AMBULATORY_CARE_PROVIDER_SITE_OTHER): Payer: Medicare Other | Admitting: Cardiovascular Disease

## 2020-07-05 ENCOUNTER — Other Ambulatory Visit: Payer: Self-pay

## 2020-07-05 ENCOUNTER — Encounter: Payer: Self-pay | Admitting: Cardiovascular Disease

## 2020-07-05 VITALS — BP 128/59 | HR 54 | Wt 165.0 lb

## 2020-07-05 DIAGNOSIS — Z7901 Long term (current) use of anticoagulants: Secondary | ICD-10-CM

## 2020-07-05 DIAGNOSIS — I10 Essential (primary) hypertension: Secondary | ICD-10-CM | POA: Diagnosis not present

## 2020-07-05 DIAGNOSIS — I422 Other hypertrophic cardiomyopathy: Secondary | ICD-10-CM

## 2020-07-05 DIAGNOSIS — I2510 Atherosclerotic heart disease of native coronary artery without angina pectoris: Secondary | ICD-10-CM

## 2020-07-05 DIAGNOSIS — G4733 Obstructive sleep apnea (adult) (pediatric): Secondary | ICD-10-CM

## 2020-07-05 DIAGNOSIS — I480 Paroxysmal atrial fibrillation: Secondary | ICD-10-CM

## 2020-07-05 NOTE — Patient Instructions (Signed)
Medication Instructions:  CONTINUE WITH CURRENT MEDICATIONS. NO CHANGES.  *If you need a refill on your cardiac medications before your next appointment, please call your pharmacy*  Follow-Up: At [facility], you and your health needs are our priority.  As part of our continuing mission to provide you with exceptional heart care, we have created designated Provider Care Teams.  These Care Teams include your primary Cardiologist (physician) and Advanced Practice Providers (APPs -  Physician Assistants and Nurse Practitioners) who all work together to provide you with the care you need, when you need it.  We recommend signing up for the patient portal called "MyChart".  Sign up information is provided on this After Visit Summary.  MyChart is used to connect with patients for Virtual Visits (Telemedicine).  Patients are able to view lab/test results, encounter notes, upcoming appointments, etc.  Non-urgent messages can be sent to your provider as well.   To learn more about what you can do with MyChart, go to [email or web address].    Your next appointment:   6 month(s)  The format for your next appointment:   In Person  Provider:   Thomas Kelly, MD     

## 2020-07-05 NOTE — Progress Notes (Signed)
Cardiology Office Note    Date:  07/10/2020   ID:  Barbara [name], DOB [birth year], MRN [id number]  PCP:  Laurey Morale, MD  Cardiologist:  Shelva Majestic, MD   4 month F/U follow-up evaluation  History of Present Illness:  Barbara [name] is a 79 y.o. female who has CAD and in November 2000 underwent stenting of a high-grade LAD stenosis with an [id number] 3.518 mm bare-metal stent. A nuclear perfusion study in November 2013 was unchanged from previously and continued to show normal perfusion without scar or ischemia. Ejection fraction was 68%. An echo Doppler study revealed an ejection fraction in the 55-60% range with grade 1 diastolic dysfunction. 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  Additional problems include hypertension as well as hyperlipidemia. Over the past several months she has noticed that she is more tired. She also has noticed more shortness of breath with activity. She has noted some vague indigestion symptoms. She admits to some mild ankle swelling. She has been taking Crestor 10 mg and denies myalgias. She has been on losartan HCT 100/25 as well as amlodipine 10 mg and Lasix 20 mg for blood pressure and peripheral edema.  When I saw her in November 2015 her blood pressure was controlled. She was bradycardic not on any rate control medication and I raise the possibility of a component of chronotropic incompetence. To further evaluate her exertional dyspnea she underwent a nuclear perfusion study on 10/06/2014 which was normal. Post stress ejection fraction was 67%. An echo Doppler study done on 10/06/2014 showed an EF of 60-65% with moderate left ventricular hypertrophy. There was grade 1 diastolic dysfunction. She had indeterminate LV filling pressure. There was mild aortic sclerosis without stenosis, mitral annular calcification with mild MR, and mild  dilatation of her left atrium with mild tricuspid regurgitation. Pulmonary pressures were minimally elevated at 31 mm.  She underwent successful left knee surgery by Dr. Lindwood Qua in March 2016. She tolerated surgery well from a cardiovascular standpoint.  An echo Doppler study in March 2017 showed mild LVH with vigorous LV function with an EF of 65-70%. There was grade 2 diastolic dysfunction. There was moderate aortic sclerosis without stenosis. There was mild LA dilation. PA pressures were upper normal.  When Isaw her in May 2018, she had noticed issues with ankle swelling. She had stopped taking furosemide since his had expired. I elected to institute spironolactone with her moderate diastolic dysfunction. She was taking amlodipine 10 mg, losartan HCT 100/25 mg and she has been on spironolactone 25 mg daily.   When I saw her in August 2018 she was in sinus rhythm and had bilateral ankle and feet swelling. I reduced her amlodipine to 5 mg and further titrated spironolactone to 25 mg twice a day. Recently she has been feeling well. She went to [place] over Christmas and did well. However, since 11/05/2017 she noticed her heart rate being a little faster and subsequently felt some irregularity. I saw her in the office on 11/10/2017 and she was in atrial flutter with variable block.  Her blood pressure was elevated. I started metoprolol 25 mg twice a day and started eliquis 5 mg twice a day. An echo Doppler study on January14,2019 showed an EF of 70-75% with grade 2 diastolic dysfunction. There was mild to moderate MR, moderately severe LA enlargement, and PA pressure was increased at 53 mm. She was hospitalized and  underwent Tikosyn loading with Dr. Rayann Heman, which ultimately pharmacologically cardioverted her back to sinus rhythm. She was seen in follow-up in the atrial fibrillation clinic on 12/01/2017. At that time, she was maintaining sinus rhythm and her QT interval was  stable.   I saw her on December 03, 2017 at which time she was doing well. Subsequently she has seen Dr. Rayann Heman. Has had issues with hoarseness and was referred to Dr. Redmond Baseman for ENT evaluation. 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 She is scheduled to undergo a CT of the soft tissue of her neck with contrast on January 20, 2018. She was recently notified that her valsartan has a contaminant. Shedenies any awareness of recurrent atrial fibrillation. She denies significant swelling. She has been maintained on furosemide 40 mg daily, losartan 50 mg for hypertension. She denies bleeding on Eliquis 5 mg twice daily. She continues to be on rosuvastatin for hyperlipidemia. She is on Tikosyn 125 mcg twice a day.   I saw her in the office in March 2019, she has continued to do well.  Due to concerns for obstructive sleep apnea she was referred for a sleep study.  This was done in December 2019 revealed severe sleep apnea with an AHI at 37/h, with REM sleep AHI at 52/h in supine sleep AHI at 47/h.  She had significant oxygen desaturation to a nadir of 77%.  CPAP was instituted and she was titrated up to 11 cm.  She was started on CPAP therapy in 2020 with [facility] as her DME company.  A most recent download from April 7 through Mar 10, 2019 shows that she is meeting compliance standards with 90% of usage days.  She is averaging 6 hours and 42 minutes of usage per night.  At 10 cm water pressure AHI is excellent at 2.9.  There is no leak.  She now has nasal pillows, but previously it had a full facemask which she did not tolerate.  She developed episodes of chest pain and increasing shortness of breath leading to her hospitalization in April 2020.  Was mild troponin elevation and she had inferolateral T wave abnormalities.  She underwent cardiac catheterization.  The results are as shown below and she was found to have nonobstructive CAD with a widely patent previously placed LAD  stent and mild concomitant CAD.  She had LVH with a "Spade" like ventricle.  She was last evaluated by me on Mar 11, 2019 in a telemedicine visit.  At that time she denied any chest pain but admitted to some shortness of breath.  She was sleeping better.  However she was still not sleeping long and often only averaging between 6 to 7 hours of sleep per night which resulted in some fatigue during the day.  She was unaware of breakthrough snoring.  A download was obtained which showed an AHI of 2.9 at 10 cm set pressure.  Since I last saw her 1 year ago, she was evaluated by Charyl Dancer in December 2020 in our office.  She continued to do well without chest pain.  Her blood pressure was elevated.  She was maintaining sinus rhythm on Tikosyn 125 mg twice a day as well as Eliquis 5 mg twice a day.  ECG showed sinus bradycardia at 54 bpm.  I last saw her Mar 31, 2020.  Prior to that evaluation she had undergone  shoulder surgery on Mar 23, 2020 the right reverse shoulder arthroplasty by Dr. 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  His surgery well from a cardiac standpoint.  Since surgery she has been in the sling and during this time has not been able to use CPAP.  She will be having a follow-up orthopedic visit over the next several days and hopefully the sling will be taken off and CPAP can be reinstituted.  She denied any recent chest tightness or pressure.  She was unaware of palpitations.  Over the last several months, she has felt well.  She has gained mobility back to her right shoulder.  She again has resumed CPAP therapy.  A download was obtained from August 2 through July 04, 2020.  She had undergone an echo Doppler study which showed hyperdynamic LV function with ejection fraction at 70 to 75%.  There was mild aortic sclerosis without stenosis.  Estimated RV systolic pressure was mildly increased at 35.9.  She had apical hypertrophy with obliteration of LV cavity in  systole and apex suggestive for apical hypertrophic cardiomyopathy.  Presently she is without chest pain or shortness of breath.  She presents for reevaluation.  Past Medical History:  Diagnosis Date   A-fib [facility])    Allergy    CAD (coronary artery disease)    sees Dr. Shelva Majestic  cardiac stents - 2000   CHF (congestive heart failure) ([place])    Colon polyps    Complication of anesthesia    rash/hives with "caines"   Dyspnea    02/12/18 " when my heart gets out of rhythm, it has not been out of rhythym- since I have been on Tikosyn (11/2017)   Dysrhythmia    afib fib   GERD (gastroesophageal reflux disease)    takes OTC- Omeprazole- prn   Heart murmur    History of stress test    show normal perfusion without scar or ischemia, post EF 68%   Hx of echocardiogram    show an EF 55%-60% range with grade 1 diastolic dysfunction, she had mitral anular calcification with mild MR, moderate LA dilation and mild pulmonary hypertension with a PA estimated pressure of 54m   Hyperlipidemia    Hypertension    NSTEMI (non-ST elevated myocardial infarction) ([facility]    Osteoarthritis    Pneumonia    hx of 2015    PONV (postoperative nausea and vomiting)     Past Surgical History:  Procedure Laterality Date   CARDIAC CATHETERIZATION     11/2017   cardiac stents  2000   COLONOSCOPY  01-05-14   per Dr. JTeena Irani clear, no repeats needed    COLONOSCOPY     CORONARY STENT PLACEMENT  2000   in LAD   DIRECT LARYNGOSCOPY WITH RADIAESSE INJECTION N/A 02/13/2018   Procedure: DIRECT LARYNGOSCOPY WITH RADIAESSE INJECTION;  Surgeon: BMelida Quitter MD;  [facility]: [facility]  Service: ENT;  Laterality: N/A;   EYE SURGERY Left    CATARACT REMOVAL   KNEE ARTHROSCOPY Left 01/06/2015   Procedure: LEFT KNEE ARTHROSCOPY, abrasion chondroplasty of the medial femerol condryl,medial and lateral menisectomy, microfracture , synovectomy of the suprpatellar pouch;  Surgeon: RLatanya Maudlin MD;   Location: [facility];  Service: Orthopedics;  Laterality: Left;   LEFT HEART CATH AND CORONARY ANGIOGRAPHY N/A 02/26/2019   Procedure: LEFT HEART CATH AND CORONARY ANGIOGRAPHY;  Surgeon: KTroy Sine MD;  [facility];  Service: Cardiovascular;  Laterality: N/A;   MICROLARYNGOSCOPY W/VOCAL CORD INJECTION N/A 08/07/2018   Procedure: MICROLARYNGOSCOPY WITH VOCAL CORD INJECTION OF PROLARYN;  Surgeon: Melida Quitter, MD;  Location: [facility] OR;  Service: ENT;  Laterality: N/A;  JET VENTILATION   REVERSE SHOULDER ARTHROPLASTY Right 03/23/2020   Procedure: REVERSE SHOULDER ARTHROPLASTY;  Surgeon: Nicholes Stairs, MD;  Location: [facility];  Service: Orthopedics;  Laterality: Right;   VAGINAL HYSTERECTOMY  1971    Current Medications: Outpatient Medications Prior to Visit  Medication Sig Dispense Refill   acetaminophen (TYLENOL) 500 MG tablet Take 1,000 mg by mouth every 6 (six) hours as needed for mild pain or headache.     aspirin EC 81 MG EC tablet Take 1 tablet (81 mg total) by mouth daily. Swallow whole. 30 tablet 11   Biotin 5000 MCG SUBL Place 10,000 mcg under the tongue daily.     candesartan (ATACAND) 16 MG tablet Take 1 tablet (16 mg total) by mouth daily. 90 tablet 3   Cholecalciferol (VITAMIN D3) 2000 units TABS Take 2,000 Units by mouth daily.     dextromethorphan (DELSYM) 30 MG/5ML liquid Take 15 mg by mouth daily as needed for cough.      dofetilide (TIKOSYN) 125 MCG capsule Take 1 capsule (125 mcg total) by mouth 2 (two) times daily. 180 capsule 1   ELIQUIS 5 MG TABS tablet TAKE 1 TABLET TWICE A DAY 180 tablet 1   HOMEOPATHIC PRODUCTS PO Place 2 tablets under the tongue See admin instructions. MagniLife Pain and Fatigue Relief (Aconitum napellus 30C HPUS Arsenicum album 30C HPUS Belladonna 30C HPUS Coniinum 30C HPUS Gelsemium sempervirens 30CHPUS Hypericum perforatum 3X, 30C HPUS Kali bichromicum 30C Phosphoricum acidum 30CHPUS Rhus tox 30C HPUS Uricum acidum 30C  HPUS Lacticum acidum 30C HPUS Lactose, Magnesium Stearate, Microcrystalline Cellulose)- Dissolve 2 tablets under the tongue three times a day     hypromellose (VISTA GEL DRY EYE RELIEF) 0.3 % GEL ophthalmic ointment Place 1 application into both eyes at bedtime.     ipratropium (ATROVENT) 0.06 % nasal spray Place 2 sprays into both nostrils daily as needed for rhinitis.   5   ketotifen (ALAWAY) 0.025 % ophthalmic solution Place 1 drop into both eyes 2 (two) times daily as needed (eye irritation.).     loratadine (CLARITIN) 10 MG tablet Take 10 mg by mouth daily as needed (allergies/sinus issues).      methocarbamol (ROBAXIN) 500 MG tablet Take 1 tablet (500 mg total) by mouth every 6 (six) hours as needed for muscle spasms. 50 tablet 0   Multiple Vitamin (MULTIVITAMIN WITH MINERALS) TABS tablet Take 1 tablet by mouth daily.     nitroGLYCERIN (NITROSTAT) 0.4 MG SL tablet Place 1 tablet (0.4 mg total) under the tongue every 5 (five) minutes x 3 doses as needed for chest pain. 25 tablet 3   omeprazole (PRILOSEC OTC) 20 MG tablet Take 20 mg by mouth daily as needed (for acid reflux).      pantoprazole (PROTONIX) 40 MG tablet Take 1 tablet (40 mg total) by mouth daily. 30 tablet 11   PRESCRIPTION MEDICATION See admin instructions. CPAP- At bedtime     rosuvastatin (CRESTOR) 40 MG tablet Take 1 tablet (40 mg total) by mouth daily. 90 tablet 3   spironolactone (ALDACTONE) 25 MG tablet Take 1 tablet (25 mg total) by mouth daily. 90 tablet 3   triamcinolone (NASACORT) 55 MCG/ACT nasal inhaler Place 1 spray into both nostrils daily as needed (for allergies).      No facility-administered medications prior to visit.     Allergies:   Lidocaine, Morphine, Procaine hcl, Sulfonamide derivatives, Norco [hydrocodone-acetaminophen],  Tramadol, Vytorin [ezetimibe-simvastatin], and Tape   Social History   Socioeconomic History   Marital status: Married    Spouse name: Not on file   Number of  children: 2   Years of education: Not on file   Highest education level: Not on file  Occupational History   Not on file  Tobacco Use   Smoking status: Former Smoker    Years: 40.00    Quit date: 11/04/1996    Years since quitting: 23.6   Smokeless tobacco: Never Used  Scientific laboratory technician Use: Never used  Substance and Sexual Activity   Alcohol use: No    Alcohol/week: 0.0 standard drinks   Drug use: No   Sexual activity: Not on file  Other Topics Concern   Not on file  Social History Narrative   Not on file   Social Determinants of Health   Financial Resource Strain:    Difficulty of Paying Living Expenses: Not on file  Food Insecurity:    Worried About Charity fundraiser in the Last Year: Not on file   [employer] of Food in the Last Year: Not on file  Transportation Needs:    Lack of Transportation (Medical): Not on file   Lack of Transportation (Non-Medical): Not on file  Physical Activity:    Days of Exercise per Week: Not on file   Minutes of Exercise per Session: Not on file  Stress:    Feeling of Stress : Not on file  Social Connections:    Frequency of Communication with Friends and Family: Not on file   Frequency of Social Gatherings with Friends and Family: Not on file   Attends Religious Services: Not on file   Active Member of Clubs or Organizations: Not on file   Attends Archivist Meetings: Not on file   Marital Status: Not on file     Family History:  The patient'sfamily history includes Cancer in her maternal grandmother; Heart disease in her maternal grandfather.   ROS General: Negative; No fevers, chills, or night sweats;  HEENT: Negative; No changes in vision or hearing, sinus congestion, difficulty swallowing Pulmonary: Negative; No cough, wheezing, shortness of breath, hemoptysis Cardiovascular: Negative; No chest pain, presyncope, syncope, palpitations GI: Negative; No nausea, vomiting, diarrhea, or abdominal  pain GU: Negative; No dysuria, hematuria, or difficulty voiding Musculoskeletal: Right shoulder surgery May 2021 Hematologic/Oncology: Negative; no easy bruising, bleeding Endocrine: Negative; no heat/cold intolerance; no diabetes Neuro: Negative; no changes in balance, headaches Skin: Negative; No rashes or skin lesions Psychiatric: Negative; No behavioral problems, depression Sleep: Negative; No snoring, daytime sleepiness, hypersomnolence, bruxism, restless legs, hypnogognic hallucinations, no cataplexy Other comprehensive 14 point system review is negative.   PHYSICAL EXAM:   VS:  BP (!) 128/59    Pulse (!) 54    Wt 165 lb (74.8 kg)    SpO2 99%    BMI 25.09 kg/m     Repeat blood pressure by me was 124/64  Wt Readings from Last 3 Encounters:  07/05/20 165 lb (74.8 kg)  05/30/20 170 lb 12.8 oz (77.5 kg)  05/18/20 170 lb 13.7 oz (77.5 kg)    General: Alert, oriented, no distress.  Skin: normal turgor, no rashes, warm and dry HEENT: Normocephalic, atraumatic. Pupils equal round and reactive to light; sclera anicteric; extraocular muscles intact;  Nose without nasal septal hypertrophy Mouth/Parynx benign; Mallinpatti scale 3 Neck: No JVD, no carotid bruits; normal carotid upstroke Lungs: clear to ausculatation and percussion; no  wheezing or rales Chest wall: without tenderness to palpitation Heart: PMI not displaced, RRR, s1 s2 normal,1- 2/6 systolic murmur, no diastolic murmur, no rubs, gallops, thrills, or heaves Abdomen: soft, nontender; no hepatosplenomehaly, BS+; abdominal aorta nontender and not dilated by palpation. Back: no CVA tenderness Pulses 2+ Musculoskeletal: full range of motion, normal strength, no joint deformities Extremities: no clubbing cyanosis or edema, Homan's sign negative  Neurologic: grossly nonfocal; Cranial nerves grossly wnl Psychologic: Normal mood and affect   Studies/Labs Reviewed:   EKG:  EKG not ordered today.  ECG (independently read by  me): Sinus bradycardia at 54; LVHwih T wave abnormality inferolaterally  I personally reviewed the ECG from October 26, 2019 which showed sinus bradycardia at 54 bpm, LVH with repolarization abnormality.  QTc interval was 398 ms.  Recent Labs: BMP Latest Ref Rng & Units 06/22/2020 06/13/2020 05/20/2020  Glucose 65 - 99 mg/dL 90 93 92  BUN 8 - 27 mg/dL 22 30(H) 23  Creatinine 0.57 - 1.00 mg/dL 1.42(H) 1.52(H) 1.33(H)  BUN/Creat Ratio 12 - '28 15 20 ' -  Sodium 134 - 144 mmol/L 138 138 136  Potassium 3.5 - 5.2 mmol/L 4.8 5.3(H) 4.2  Chloride 96 - 106 mmol/L 104 104 103  CO2 20 - 29 mmol/L '23 21 23  ' Calcium 8.7 - 10.3 mg/dL 10.2 10.4(H) 10.4(H)     Hepatic Function Latest Ref Rng & Units 12/02/2019 02/25/2019 11/19/2018  Total Protein 6.0 - 8.3 g/dL 6.8 7.5 7.0  Albumin 3.5 - 5.2 g/dL 4.2 4.4 4.2  AST 0 - 37 U/L 29 52(H) 23  ALT 0 - 35 U/L '24 29 18  ' Alk Phosphatase 39 - 117 U/L 68 78 71  Total Bilirubin 0.2 - 1.2 mg/dL 0.4 1.0 0.5  Bilirubin, Direct 0.0 - 0.3 mg/dL 0.1 - 0.1    CBC Latest Ref Rng & Units 05/17/2020 04/06/2020 03/24/2020  WBC 4.0 - 10.5 K/uL 6.1 6.3 13.4(H)  Hemoglobin 12.0 - 15.0 g/dL 13.4 11.8 12.9  Hematocrit 36 - 46 % 42.4 35.3 38.6  Platelets 150 - 400 K/uL 226 253 163   Lab Results  Component Value Date   MCV 93.8 05/17/2020   MCV 88 04/06/2020   MCV 89.4 03/24/2020   Lab Results  Component Value Date   TSH 0.994 10/26/2019   Lab Results  Component Value Date   HGBA1C 5.6 02/25/2019     BNP    Component Value Date/Time   BNP 703.5 (H) 02/25/2019 1122    ProBNP No results found for: PROBNP   Lipid Panel     Component Value Date/Time   CHOL 129 12/02/2019 1541   TRIG 80.0 12/02/2019 1541   HDL 60.10 12/02/2019 1541   CHOLHDL 2 12/02/2019 1541   VLDL 16.0 12/02/2019 1541   LDLCALC 53 12/02/2019 1541     RADIOLOGY: No results found.   Additional studies/ records that were reviewed today include:   Sleep Study IMPRESSIONS: 10/07/2018 -  Severe obstructive sleep apnea occurred during the diagnostic portion of the study (AHI 36.9/h; RDI 45.1/h); events were worse with supine position (AHI 46.8/h) and during REM sleep (AHI 52.2/h). CPAP was initiated at 5 cm and was titrated to optimal PAP pressure of 11 cm of water. - No significant central sleep apnea occurred during the diagnostic portion of the study (CAI = 0.0/hour). - Severe oxygen desaturation during the diagnostic portion of the study to a nadir of 77%. - The patient snored with loud snoring volume during the diagnostic portion  of the study. - No cardiac abnormalities were noted during this study. - Clinically significant periodic limb movements did not occur during sleep.  DIAGNOSIS - Obstructive Sleep Apnea (327.23 [G47.33 ICD-10])  RECOMMENDATIONS - Trial of CPAP therapy with EPR at 11 cm H2O with heated humidification. A Medium size Resmed Full Face Mask AirFit 20 for Her mask was used for the titration.  - Effort should be made to optimize nasal and oropharyngeal patency. - Avoid alcohol, sedatives and other CNS depressants that may worsen sleep apnea and disrupt normal sleep architecture. - Sleep hygiene should be reviewed to assess factors that may improve sleep quality. - Weight management and regular exercise should be initiated or continued. - Recommend a download in 30 days and sleep clinic evaluation after 4 weeks of therapy.  [Electronically signed] 10/18/2018 04:25 PM  Cath: 02/26/19   Prox RCA to Mid RCA lesion is 15% stenosed.  Ost LAD to Prox LAD lesion is 20% stenosed.  Prox Cx lesion is 20% stenosed.  Mild non-obstructive coronary artery disease with a patent proximal LAD stent with mild 20% intimal hyperplasia (inserted 09/1999); 20% proximal left circumflex narrowing, and mild irregularity of 10 to 15% in the mid RCA.  Hyperdynamic LV function with an "Ace of Spade "configuration and near cavity obliteration in the mid to apical  segment during systole. There is evidence for left ventricular hypertrophy. LVEDP is 16 mm.  RECOMMENDATION: Medical therapy with optimal blood pressure control, lipid management, and resumption of Eliquis tomorrow    ASSESSMENT:    1. 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 Essential hypertension   4. OSA (obstructive sleep apnea)   5. Paroxysmal atrial fibrillation (HCC)   6. Anticoagulated      PLAN:  1.  CAD: Cardiac catheterization from February 26, 2020 was again reviewed.  The LAD the stent remain patent.  LAD stent was patent.  She had mild nonobstructive CAD involving her circumflex and RCA.  She is not having any recurrent anginal symptomatology.  2.  Exertional dyspnea: Probably contributed by LVH.  She has a "spade-like ventricle with near cavity obliteration in the mid to apical segment of her myocardium during systole.  There is evidence for diastolic dysfunction.  Which revealed hyperdynamic LV function with EF at 70 to 41%, grade 2 diastolic dysfunction, and apical hypertrophy with obliteration of her LV cavity in systole at apex given the appearance of her "spade-like ventricle "catheterization.  Findings are consistent with probable apical hypertrophic cardiomyopathy  3. Obstructive sleep apnea: Her sleep studies were reviewed which we demonstrated severe sleep apnea we discussed improved compliance since she had only used treatment 21 out of the last 30 days.  However AHI is excellent at 2.5/h.  And again discussed with her that the preponderance of REM sleep occurs in the second half of the night  4.  PAF: Recurrent atrial fibrillation, she is maintaining sinus rhythm on dofetilide 125 mcg twice a day.  5.  Eliquis anticoagulation: She continues to tolerate well without bleeding  6.  Essential hypertension: Blood pressure today is stable at 124/64 when taken by me.  She continues to be on candesartan 16 mg in addition to  spironolactone 25 mg daily.    7.  Hyperlipidemia: Most recent LDL in January 2021 was 53 on rosuvastatin 40 mg.  Medication Adjustments/Labs and Tests Ordered: Current medicines are reviewed at length with the patient today.  Concerns regarding medicines are outlined above.  Medication changes, Labs and Tests ordered today are listed in the Patient Instructions below. Patient Instructions  Medication Instructions:  CONTINUE WITH CURRENT MEDICATIONS. NO CHANGES.  *If you need a refill on your cardiac medications before your next appointment, please call your pharmacy*    Follow-Up: At [facility], you and your health needs are our priority.  As part of our continuing mission to provide you with exceptional heart care, we have created designated Provider Care Teams.  These Care Teams include your primary Cardiologist (physician) and Advanced Practice Providers (APPs -  Physician Assistants and Nurse Practitioners) who all work together to provide you with the care you need, when you need it.  We recommend signing up for the patient portal called "MyChart".  Sign up information is provided on this After Visit Summary.  MyChart is used to connect with patients for Virtual Visits (Telemedicine).  Patients are able to view lab/test results, encounter notes, upcoming appointments, etc.  Non-urgent messages can be sent to your provider as well.   To learn more about what you can do with MyChart, go to [email or web address].    Your next appointment:   6 month(s)  The format for your next appointment:   In Person  Provider:   Shelva Majestic, MD      Signed, Shelva Majestic, MD  07/10/2020 10:03 AM    [facility] [address] [place], [place], [place]  [address] Phone: [phone]

## 2020-07-06 NOTE — Telephone Encounter (Signed)
Sent mychart message in regards to PA approval for [employer]

## 2020-07-06 NOTE — Telephone Encounter (Signed)
Pt had ov 9/1 with dr.Kelly

## 2020-07-10 ENCOUNTER — Encounter: Payer: Self-pay | Admitting: Cardiovascular Disease

## 2020-07-24 ENCOUNTER — Other Ambulatory Visit: Payer: Medicare Other | Admitting: *Deleted

## 2020-07-24 ENCOUNTER — Other Ambulatory Visit: Payer: Self-pay

## 2020-07-24 ENCOUNTER — Telehealth: Payer: Self-pay | Admitting: Physician Assistant

## 2020-07-24 ENCOUNTER — Other Ambulatory Visit: Payer: Self-pay | Admitting: *Deleted

## 2020-07-24 DIAGNOSIS — I5032 Chronic diastolic (congestive) heart failure: Secondary | ICD-10-CM

## 2020-07-24 DIAGNOSIS — R7989 Other specified abnormal findings of blood chemistry: Secondary | ICD-10-CM

## 2020-07-24 NOTE — Telephone Encounter (Signed)
Forms from [facility] received on 07/24/20. Completed patient auth attached. Took form to [employer] for completion. 07/24/20 vlm

## 2020-07-24 NOTE — Progress Notes (Signed)
Lab tech called triage to have BMET placed on this pt, from where Estella Husk PA-C ordered this a month ago based on pts recent lab results.  BMET order placed and pt is here now in the office to have drawn.

## 2020-07-25 ENCOUNTER — Telehealth: Payer: Self-pay | Admitting: *Deleted

## 2020-07-25 DIAGNOSIS — R7989 Other specified abnormal findings of blood chemistry: Secondary | ICD-10-CM

## 2020-07-25 LAB — BASIC METABOLIC PANEL
BUN/Creatinine Ratio: 16 (ref 12–28)
BUN: 24 mg/dL (ref 8–27)
CO2: 22 mmol/L (ref 20–29)
Calcium: 10.3 mg/dL (ref 8.7–10.3)
Chloride: 104 mmol/L (ref 96–106)
Creatinine, Ser: 1.52 mg/dL — ABNORMAL HIGH (ref 0.57–1.00)
GFR calc Af Amer: 37 mL/min/{1.73_m2} — ABNORMAL LOW (ref >59)
GFR calc non Af Amer: 32 mL/min/{1.73_m2} — ABNORMAL LOW (ref >59)
Glucose: 141 mg/dL — ABNORMAL HIGH (ref 65–99)
Potassium: 4.6 mmol/L (ref 3.5–5.2)
Sodium: 139 mmol/L (ref 134–144)

## 2020-07-25 NOTE — Telephone Encounter (Signed)
-----   Message from Liliane Shi, [place] sent at 07/25/2020 10:31 AM EDT ----- Results reviewed for Barbara Barrios, PA-C  Creatinine increased some again.  Overall fairly stable.  K+ normal.  We will have to follow her Creatinine to see if it increases more.  We may have to reduce dose of Candesartan further or change to Amlodipine.  Also, if Creatinine remains 1.5 or higher, we will need to reduce dose of Apixaban.  PLAN:  -Continue current meds. -Repeat BMET again in 1 week (put under Barbara Barrios, PA-C name) Richardson Dopp, PA-C    07/25/2020 10:28 AM

## 2020-07-25 NOTE — Telephone Encounter (Signed)
Called and spoke with patient.  She read the lab results in [facility].  We reviewed that she is taking Candesartan 16 mg and spironolactone 25 mg daily.  Will return next Monday for repeat BMET.

## 2020-07-31 ENCOUNTER — Other Ambulatory Visit: Payer: Self-pay

## 2020-07-31 ENCOUNTER — Other Ambulatory Visit: Payer: Medicare Other | Admitting: *Deleted

## 2020-07-31 DIAGNOSIS — R7989 Other specified abnormal findings of blood chemistry: Secondary | ICD-10-CM

## 2020-08-01 LAB — BASIC METABOLIC PANEL
BUN/Creatinine Ratio: 18 (ref 12–28)
BUN: 25 mg/dL (ref 8–27)
CO2: 21 mmol/L (ref 20–29)
Calcium: 10.8 mg/dL — ABNORMAL HIGH (ref 8.7–10.3)
Chloride: 104 mmol/L (ref 96–106)
Creatinine, Ser: 1.37 mg/dL — ABNORMAL HIGH (ref 0.57–1.00)
GFR calc Af Amer: 42 mL/min/{1.73_m2} — ABNORMAL LOW (ref >59)
GFR calc non Af Amer: 36 mL/min/{1.73_m2} — ABNORMAL LOW (ref >59)
Glucose: 107 mg/dL — ABNORMAL HIGH (ref 65–99)
Potassium: 4.6 mmol/L (ref 3.5–5.2)
Sodium: 139 mmol/L (ref 134–144)

## 2020-08-02 ENCOUNTER — Ambulatory Visit (INDEPENDENT_AMBULATORY_CARE_PROVIDER_SITE_OTHER): Payer: Medicare Other | Admitting: Podiatry

## 2020-08-02 ENCOUNTER — Encounter: Payer: Self-pay | Admitting: Podiatry

## 2020-08-02 ENCOUNTER — Other Ambulatory Visit: Payer: Self-pay

## 2020-08-02 DIAGNOSIS — M79674 Pain in right toe(s): Secondary | ICD-10-CM

## 2020-08-02 DIAGNOSIS — M79675 Pain in left toe(s): Secondary | ICD-10-CM

## 2020-08-02 DIAGNOSIS — I480 Paroxysmal atrial fibrillation: Secondary | ICD-10-CM

## 2020-08-02 DIAGNOSIS — R7989 Other specified abnormal findings of blood chemistry: Secondary | ICD-10-CM

## 2020-08-02 DIAGNOSIS — I5032 Chronic diastolic (congestive) heart failure: Secondary | ICD-10-CM

## 2020-08-02 DIAGNOSIS — B351 Tinea unguium: Secondary | ICD-10-CM | POA: Diagnosis not present

## 2020-08-03 NOTE — Progress Notes (Signed)
Subjective: Barbara [name] is a pleasant 79 y.o. female patient seen today painful thick toenails that are difficult to trim. Pain interferes with ambulation. Aggravating factors include wearing enclosed shoe gear. Pain is relieved with periodic professional debridement.   Patient voices no new pedal problem for today's visit.  Past Medical History:  Diagnosis Date  . A-fib ([place])   . Allergy   . CAD (coronary artery disease)    sees Dr. Shelva Majestic  cardiac stents - 2000  . CHF (congestive heart failure) ([place])   . Colon polyps   . Complication of anesthesia    rash/hives with "caines"  . Dyspnea    02/12/18 " when my heart gets out of rhythm, it has not been out of rhythym- since I have been on Tikosyn (11/2017)  . Dysrhythmia    afib fib  . GERD (gastroesophageal reflux disease)    takes OTC- Omeprazole- prn  . Heart murmur   . History of stress test    show normal perfusion without scar or ischemia, post EF 68%  . Hx of echocardiogram    show an EF 55%-60% range with grade 1 diastolic dysfunction, she had mitral anular calcification with mild MR, moderate LA dilation and mild pulmonary hypertension with a PA estimated pressure of 70mm  . Hyperlipidemia   . Hypertension   . NSTEMI (non-ST elevated myocardial infarction) ([place])   . Osteoarthritis   . Pneumonia    hx of 2015   . PONV (postoperative nausea and vomiting)     Patient Active Problem List   Diagnosis Date Noted  . CKD (chronic kidney disease), stage III 05/20/2020  . Apical variant hypertrophic cardiomyopathy ([place])   . Chest pain of uncertain etiology   . Chest pain   . Elevated troponin   . Pure hypercholesterolemia   . 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 PAF (paroxysmal atrial fibrillation) ([place]) 02/28/2019  . NSTEMI (non-ST elevated myocardial infarction) ([place]) 02/25/2019  . Pain in left knee 01/06/2018  . Dysphonia 12/29/2017  . 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 CHF  (congestive heart failure) ([place]) 11/16/2017  . Gastroesophageal reflux disease 11/06/2017  . Bradycardia 12/27/2015  . Exertional dyspnea 12/23/2014  . Preoperative clearance 12/23/2014  . Hyperlipidemia LDL goal <70 09/13/2014  . Sinus bradycardia 09/13/2014  . LUMBAGO 05/03/2010  . VERTIGO 01/21/2009  . TEMPOROMANDIBULAR JOINT PAIN 08/31/2008  . Osteoarthritis 05/31/2008  . 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 HYPERLIPIDEMIA 04/06/2007  . Essential hypertension 04/06/2007  . CAD (coronary artery disease) 04/06/2007  . ALLERGIC RHINITIS 04/06/2007  . Personal history of colonic polyps 04/06/2007    Current Outpatient Medications on File Prior to Visit  Medication Sig Dispense Refill  . acetaminophen (TYLENOL) 500 MG tablet Take 1,000 mg by mouth every 6 (six) hours as needed for mild pain or headache.    Marland Kitchen aspirin EC 81 MG EC tablet Take 1 tablet (81 mg total) by mouth daily. Swallow whole. 30 tablet 11  . Biotin 5000 MCG SUBL Place 10,000 mcg under the tongue daily.    . candesartan (ATACAND) 16 MG tablet Take 1 tablet (16 mg total) by mouth daily. 90 tablet 3  . Cholecalciferol (VITAMIN D3) 2000 units TABS Take 2,000 Units by mouth daily.    Marland Kitchen dextromethorphan (DELSYM) 30 MG/5ML liquid Take 15 mg by mouth daily as needed for cough.     . dofetilide (TIKOSYN) 125 MCG capsule Take 1 capsule (125 mcg total) by mouth 2 (  two) times daily. 180 capsule 1  . ELIQUIS 5 MG TABS tablet TAKE 1 TABLET TWICE A DAY 180 tablet 1  . HOMEOPATHIC PRODUCTS PO Place 2 tablets under the tongue See admin instructions. [employer] Pain and Fatigue Relief (Aconitum napellus 30C HPUS Arsenicum album 30C HPUS Belladonna 30C HPUS Coniinum 30C HPUS Gelsemium sempervirens 30CHPUS Hypericum perforatum 3X, 30C HPUS Kali bichromicum 30C Phosphoricum acidum 30CHPUS Rhus tox 30C HPUS Uricum acidum 30C HPUS Lacticum acidum 30C HPUS Lactose, Magnesium Stearate, Microcrystalline Cellulose)- Dissolve 2 tablets under the  tongue three times a day    . hypromellose (VISTA GEL DRY EYE RELIEF) 0.3 % GEL ophthalmic ointment Place 1 application into both eyes at bedtime.    Marland Kitchen ipratropium (ATROVENT) 0.06 % nasal spray Place 2 sprays into both nostrils daily as needed for rhinitis.   5  . ketotifen (ALAWAY) 0.025 % ophthalmic solution Place 1 drop into both eyes 2 (two) times daily as needed (eye irritation.).    Marland Kitchen loratadine (CLARITIN) 10 MG tablet Take 10 mg by mouth daily as needed (allergies/sinus issues).     . methocarbamol (ROBAXIN) 500 MG tablet Take 1 tablet (500 mg total) by mouth every 6 (six) hours as needed for muscle spasms. 50 tablet 0  . Multiple Vitamin (MULTIVITAMIN WITH MINERALS) TABS tablet Take 1 tablet by mouth daily.    . nitroGLYCERIN (NITROSTAT) 0.4 MG SL tablet Place 1 tablet (0.4 mg total) under the tongue every 5 (five) minutes x 3 doses as needed for chest pain. 25 tablet 3  . omeprazole (PRILOSEC OTC) 20 MG tablet Take 20 mg by mouth daily as needed (for acid reflux).     . pantoprazole (PROTONIX) 40 MG tablet Take 1 tablet (40 mg total) by mouth daily. 30 tablet 11  . PRESCRIPTION MEDICATION See admin instructions. CPAP- At bedtime    . rosuvastatin (CRESTOR) 40 MG tablet Take 1 tablet (40 mg total) by mouth daily. 90 tablet 3  . spironolactone (ALDACTONE) 25 MG tablet Take 1 tablet (25 mg total) by mouth daily. 90 tablet 3  . triamcinolone (NASACORT) 55 MCG/ACT nasal inhaler Place 1 spray into both nostrils daily as needed (for allergies).      No current facility-administered medications on file prior to visit.    Allergies  Allergen Reactions  . Lidocaine Anaphylaxis  . Morphine Other (See Comments)    "Body shuts down"  . Procaine Hcl Anaphylaxis, Rash and Other (See Comments)    "Anything with 'caine' in it "  . Sulfonamide Derivatives Hives  . Norco [Hydrocodone-Acetaminophen] Other (See Comments)    "Made me sick"  . Tramadol Nausea Only  . Vytorin [Ezetimibe-Simvastatin]  Other (See Comments)    Unknown  . Tape Itching and Other (See Comments)    Patient prefers either paper tape or Coban wrap    Objective: Physical Exam  General: YANISA GOODGAME is a pleasant 79 y.o. African American female, in NAD. AAO x 3.   Vascular:  Neurovascular status unchanged b/l lower extremities. Capillary refill time to digits immediate b/l. Palpable pedal pulses b/l LE. Pedal hair sparse. Lower extremity skin temperature gradient within normal limits. Nonpitting edema noted b/l lower extremities.  Dermatological:  Pedal skin with normal turgor, texture and tone bilaterally. No open wounds bilaterally. No interdigital macerations bilaterally. Toenails 1-5 b/l elongated, discolored, dystrophic, thickened, crumbly with subungual debris and tenderness to dorsal palpation.  Musculoskeletal:  Normal muscle strength 5/5 to all lower extremity muscle groups bilaterally. No pain crepitus  or joint limitation noted with ROM b/l. No gross bony deformities bilaterally. Utilizes cane for ambulation assistance.  Neurological:  Protective sensation intact 5/5 intact bilaterally with 10g monofilament b/l. Vibratory sensation intact b/l. Proprioception intact bilaterally.  Assessment and Plan:  1. Pain due to onychomycosis of toenails of both feet    -Examined patient. -No new findings. No new orders. -Toenails 1-5 b/l were debrided in length and girth with sterile nail nippers and dremel without iatrogenic bleeding.  -Patient to continue soft, supportive shoe gear daily. -Patient/POA to call should there be question/concern in the interim.  Return in about 3 months (around 11/01/2020).  Marzetta Board, DPM

## 2020-08-22 ENCOUNTER — Telehealth: Payer: Self-pay | Admitting: Family Medicine

## 2020-08-22 ENCOUNTER — Other Ambulatory Visit: Payer: Self-pay | Admitting: Cardiovascular Disease

## 2020-08-22 NOTE — Telephone Encounter (Signed)
*  STAT* If patient is at the pharmacy, call can be transferred to refill team.   1. Which medications need to be refilled? (please list name of each medication and dose if known) pantoprazole (PROTONIX) 40 MG tablet  2. Which pharmacy/location (including street and city if local pharmacy) is medication to be sent to? [facility], [place] AT Portal to [facility] Sites  3. Do they need a 30 day or 90 day supply? 90 day   [facility] because [facility] is [facility].

## 2020-08-22 NOTE — Telephone Encounter (Signed)
Left message for patient to schedule Annual Wellness Visit.  Please schedule with Nurse Health Advisor Shannon Crews, RN at [facility]  

## 2020-08-23 MED ORDER — PANTOPRAZOLE SODIUM 40 MG PO TBEC: 40.0000 mg | DELAYED_RELEASE_TABLET | Freq: Every day | ORAL | 2 refills | Status: DC

## 2020-08-28 ENCOUNTER — Telehealth: Payer: Self-pay | Admitting: Cardiovascular Disease

## 2020-08-28 NOTE — Telephone Encounter (Signed)
Gina with [facility] states a prior authorization is needed from Dr. Claiborne Billings for pantoprazole (PROTONIX) 40 MG tablet refills. She states she PA request was faxed to our office on 08/24/20.  Phone Number: [phone]

## 2020-08-30 NOTE — Telephone Encounter (Signed)
PA form reviewed by MD and faxed back to [place].

## 2020-09-05 NOTE — Telephone Encounter (Signed)
Prior authorization for Pantoprazole has been approved through 09/01/21.

## 2020-09-12 ENCOUNTER — Telehealth: Payer: Self-pay | Admitting: Cardiovascular Disease

## 2020-09-12 NOTE — Telephone Encounter (Signed)
New message:     Patient calling to get some one to speak with her concering her medication.

## 2020-09-12 NOTE — Telephone Encounter (Signed)
The patient has been notified that her insurance will not cover eliquis without a prior authorization or she will need to switch to eliquis. Prior auth needs to be submitted Nov 04 2020. I have asked her to get [place] the paperwork needed in Dec and we can submit for her Jan 1.

## 2020-09-15 ENCOUNTER — Encounter: Payer: Self-pay | Admitting: Family Medicine

## 2020-09-20 ENCOUNTER — Other Ambulatory Visit: Payer: Self-pay

## 2020-09-20 ENCOUNTER — Other Ambulatory Visit: Payer: Medicare Other | Admitting: *Deleted

## 2020-09-20 DIAGNOSIS — I480 Paroxysmal atrial fibrillation: Secondary | ICD-10-CM

## 2020-09-20 DIAGNOSIS — R7989 Other specified abnormal findings of blood chemistry: Secondary | ICD-10-CM

## 2020-09-20 DIAGNOSIS — I5032 Chronic diastolic (congestive) heart failure: Secondary | ICD-10-CM

## 2020-09-21 LAB — BASIC METABOLIC PANEL
BUN/Creatinine Ratio: 18 (ref 12–28)
BUN: 24 mg/dL (ref 8–27)
CO2: 18 mmol/L — ABNORMAL LOW (ref 20–29)
Calcium: 10.2 mg/dL (ref 8.7–10.3)
Chloride: 108 mmol/L — ABNORMAL HIGH (ref 96–106)
Creatinine, Ser: 1.32 mg/dL — ABNORMAL HIGH (ref 0.57–1.00)
GFR calc Af Amer: 44 mL/min/{1.73_m2} — ABNORMAL LOW (ref >59)
GFR calc non Af Amer: 38 mL/min/{1.73_m2} — ABNORMAL LOW (ref >59)
Glucose: 112 mg/dL — ABNORMAL HIGH (ref 65–99)
Potassium: 4.7 mmol/L (ref 3.5–5.2)
Sodium: 143 mmol/L (ref 134–144)

## 2020-09-22 ENCOUNTER — Telehealth: Payer: Self-pay | Admitting: Physician Assistant

## 2020-09-22 DIAGNOSIS — Z79899 Other long term (current) drug therapy: Secondary | ICD-10-CM

## 2020-09-22 DIAGNOSIS — R7989 Other specified abnormal findings of blood chemistry: Secondary | ICD-10-CM

## 2020-09-22 NOTE — Telephone Encounter (Signed)
Patient returning phone call for results °

## 2020-09-22 NOTE — Telephone Encounter (Signed)
Imogene Burn, PA-C  09/21/2020 10:02 AM EST     Kidney function stable. No changes. Repeat bmet in 4 months. thanks   The patient has been notified of the result and verbalized understanding.  All questions (if any) were answered.  Pt is scheduled for repeat BMET in 4 months on 01/19/21.  Pt verbalized understanding and agrees with this plan.

## 2020-10-07 ENCOUNTER — Other Ambulatory Visit: Payer: Self-pay

## 2020-10-07 ENCOUNTER — Emergency Department (HOSPITAL_COMMUNITY): Payer: Medicare Other

## 2020-10-07 ENCOUNTER — Inpatient Hospital Stay (HOSPITAL_COMMUNITY)
Admission: EM | Admit: 2020-10-07 | Discharge: 2020-10-12 | DRG: 309 | Disposition: A | Discharge status: Home / Self Care | Payer: Medicare Other | Attending: Internal Medicine | Admitting: Internal Medicine

## 2020-10-07 ENCOUNTER — Encounter (HOSPITAL_COMMUNITY): Payer: Self-pay

## 2020-10-07 DIAGNOSIS — Z882 Allergy status to sulfonamides status: Secondary | ICD-10-CM

## 2020-10-07 DIAGNOSIS — N1832 Chronic kidney disease, stage 3b: Secondary | ICD-10-CM | POA: Diagnosis present

## 2020-10-07 DIAGNOSIS — Z884 Allergy status to anesthetic agent status: Secondary | ICD-10-CM

## 2020-10-07 DIAGNOSIS — I248 Other forms of acute ischemic heart disease: Secondary | ICD-10-CM | POA: Diagnosis present

## 2020-10-07 DIAGNOSIS — Z955 Presence of coronary angioplasty implant and graft: Secondary | ICD-10-CM

## 2020-10-07 DIAGNOSIS — I2489 Other forms of acute ischemic heart disease: Secondary | ICD-10-CM | POA: Diagnosis present

## 2020-10-07 DIAGNOSIS — N183 Chronic kidney disease, stage 3 unspecified: Secondary | ICD-10-CM | POA: Diagnosis present

## 2020-10-07 DIAGNOSIS — Z7902 Long term (current) use of antithrombotics/antiplatelets: Secondary | ICD-10-CM

## 2020-10-07 DIAGNOSIS — Z96611 Presence of right artificial shoulder joint: Secondary | ICD-10-CM | POA: Diagnosis present

## 2020-10-07 DIAGNOSIS — R001 Bradycardia, unspecified: Secondary | ICD-10-CM | POA: Diagnosis present

## 2020-10-07 DIAGNOSIS — I422 Other hypertrophic cardiomyopathy: Secondary | ICD-10-CM | POA: Diagnosis present

## 2020-10-07 DIAGNOSIS — I4891 Unspecified atrial fibrillation: Secondary | ICD-10-CM | POA: Diagnosis present

## 2020-10-07 DIAGNOSIS — Z79899 Other long term (current) drug therapy: Secondary | ICD-10-CM

## 2020-10-07 DIAGNOSIS — Z7982 Long term (current) use of aspirin: Secondary | ICD-10-CM

## 2020-10-07 DIAGNOSIS — Z9109 Other allergy status, other than to drugs and biological substances: Secondary | ICD-10-CM

## 2020-10-07 DIAGNOSIS — I4819 Other persistent atrial fibrillation: Principal | ICD-10-CM | POA: Diagnosis present

## 2020-10-07 DIAGNOSIS — Z885 Allergy status to narcotic agent status: Secondary | ICD-10-CM

## 2020-10-07 DIAGNOSIS — R778 Other specified abnormalities of plasma proteins: Secondary | ICD-10-CM | POA: Diagnosis present

## 2020-10-07 DIAGNOSIS — Z8249 Family history of ischemic heart disease and other diseases of the circulatory system: Secondary | ICD-10-CM

## 2020-10-07 DIAGNOSIS — E872 Acidosis, unspecified: Secondary | ICD-10-CM

## 2020-10-07 DIAGNOSIS — I509 Heart failure, unspecified: Secondary | ICD-10-CM | POA: Diagnosis present

## 2020-10-07 DIAGNOSIS — R000 Tachycardia, unspecified: Secondary | ICD-10-CM | POA: Diagnosis not present

## 2020-10-07 DIAGNOSIS — R079 Chest pain, unspecified: Secondary | ICD-10-CM | POA: Diagnosis present

## 2020-10-07 DIAGNOSIS — Z20822 Contact with and (suspected) exposure to covid-19: Secondary | ICD-10-CM | POA: Diagnosis present

## 2020-10-07 DIAGNOSIS — I10 Essential (primary) hypertension: Secondary | ICD-10-CM | POA: Diagnosis present

## 2020-10-07 DIAGNOSIS — I2510 Atherosclerotic heart disease of native coronary artery without angina pectoris: Secondary | ICD-10-CM | POA: Diagnosis present

## 2020-10-07 DIAGNOSIS — E785 Hyperlipidemia, unspecified: Secondary | ICD-10-CM | POA: Diagnosis present

## 2020-10-07 DIAGNOSIS — Z888 Allergy status to other drugs, medicaments and biological substances status: Secondary | ICD-10-CM

## 2020-10-07 DIAGNOSIS — Z7901 Long term (current) use of anticoagulants: Secondary | ICD-10-CM

## 2020-10-07 DIAGNOSIS — Z87891 Personal history of nicotine dependence: Secondary | ICD-10-CM

## 2020-10-07 DIAGNOSIS — Z90710 Acquired absence of both cervix and uterus: Secondary | ICD-10-CM

## 2020-10-07 DIAGNOSIS — I130 Hypertensive heart and chronic kidney disease with heart failure and stage 1 through stage 4 chronic kidney disease, or unspecified chronic kidney disease: Secondary | ICD-10-CM | POA: Diagnosis present

## 2020-10-07 DIAGNOSIS — I272 Pulmonary hypertension, unspecified: Secondary | ICD-10-CM | POA: Diagnosis present

## 2020-10-07 DIAGNOSIS — M25562 Pain in left knee: Secondary | ICD-10-CM | POA: Diagnosis present

## 2020-10-07 DIAGNOSIS — K219 Gastro-esophageal reflux disease without esophagitis: Secondary | ICD-10-CM | POA: Diagnosis present

## 2020-10-07 LAB — TROPONIN I (HIGH SENSITIVITY)
Troponin I (High Sensitivity): 79 ng/L — ABNORMAL HIGH (ref <18)
Troponin I (High Sensitivity): 155 ng/L (ref <18)
Troponin I (High Sensitivity): 639 ng/L (ref <18)
Troponin I (High Sensitivity): 737 ng/L (ref <18)
Troponin I (High Sensitivity): 763 ng/L (ref <18)

## 2020-10-07 LAB — CBC
HCT: 38.2 % (ref 36.0–46.0)
Hemoglobin: 12.3 g/dL (ref 12.0–15.0)
MCH: 29.5 pg (ref 26.0–34.0)
MCHC: 32.2 g/dL (ref 30.0–36.0)
MCV: 91.6 fL (ref 80.0–100.0)
Platelets: 184 10*3/uL (ref 150–400)
RBC: 4.17 MIL/uL (ref 3.87–5.11)
RDW: 12.6 % (ref 11.5–15.5)
WBC: 6 10*3/uL (ref 4.0–10.5)
nRBC: 0 % (ref 0.0–0.2)

## 2020-10-07 LAB — BASIC METABOLIC PANEL
Anion gap: 10 (ref 5–15)
BUN: 31 mg/dL — ABNORMAL HIGH (ref 8–23)
CO2: 21 mmol/L — ABNORMAL LOW (ref 22–32)
Calcium: 10.3 mg/dL (ref 8.9–10.3)
Chloride: 107 mmol/L (ref 98–111)
Creatinine, Ser: 1.3 mg/dL — ABNORMAL HIGH (ref 0.44–1.00)
GFR, Estimated: 41 mL/min — ABNORMAL LOW (ref >60)
Glucose, Bld: 113 mg/dL — ABNORMAL HIGH (ref 70–99)
Potassium: 4.7 mmol/L (ref 3.5–5.1)
Sodium: 138 mmol/L (ref 135–145)

## 2020-10-07 LAB — RESP PANEL BY RT-PCR (FLU A&B, COVID) ARPGX2
Influenza A by PCR: NEGATIVE
Influenza B by PCR: NEGATIVE
SARS Coronavirus 2 by RT PCR: NEGATIVE

## 2020-10-07 LAB — APTT: aPTT: 37 seconds — ABNORMAL HIGH (ref 24–36)

## 2020-10-07 LAB — HEPARIN LEVEL (UNFRACTIONATED): Heparin Unfractionated: 3.92 IU/mL — ABNORMAL HIGH (ref 0.30–0.70)

## 2020-10-07 MED ORDER — CARVEDILOL 6.25 MG PO TABS
6.2500 mg | ORAL_TABLET | Freq: Two times a day (BID) | ORAL | Status: DC
  Administered 2020-10-07: 6.25 mg via ORAL
  Filled 2020-10-07: qty 1

## 2020-10-07 MED ORDER — DOFETILIDE 125 MCG PO CAPS
125.0000 ug | ORAL_CAPSULE | Freq: Two times a day (BID) | ORAL | Status: DC
  Administered 2020-10-07: 125 ug via ORAL
  Filled 2020-10-07: qty 1

## 2020-10-07 MED ORDER — CHLORHEXIDINE GLUCONATE CLOTH 2 % EX PADS
6.0000 | MEDICATED_PAD | Freq: Every day | CUTANEOUS | Status: DC
  Administered 2020-10-08 – 2020-10-09 (×2): 6 via TOPICAL

## 2020-10-07 MED ORDER — DILTIAZEM HCL-DEXTROSE 125-5 MG/125ML-% IV SOLN (PREMIX)
5.0000 mg/h | INTRAVENOUS | Status: DC
  Administered 2020-10-07: 5 mg/h via INTRAVENOUS
  Filled 2020-10-07: qty 125

## 2020-10-07 MED ORDER — ROSUVASTATIN CALCIUM 20 MG PO TABS
40.0000 mg | ORAL_TABLET | Freq: Every day | ORAL | Status: DC
  Administered 2020-10-07 – 2020-10-11 (×5): 40 mg via ORAL
  Filled 2020-10-07 (×5): qty 2

## 2020-10-07 MED ORDER — CHLORHEXIDINE GLUCONATE CLOTH 2 % EX PADS
6.0000 | MEDICATED_PAD | Freq: Every day | CUTANEOUS | Status: DC
  Administered 2020-10-08: 6 via TOPICAL

## 2020-10-07 MED ORDER — HEPARIN (PORCINE) 25000 UT/250ML-% IV SOLN
1050.0000 [IU]/h | INTRAVENOUS | Status: DC
  Administered 2020-10-07: 1050 [IU]/h via INTRAVENOUS
  Filled 2020-10-07: qty 250

## 2020-10-07 MED ORDER — ORAL CARE MOUTH RINSE
15.0000 mL | Freq: Two times a day (BID) | OROMUCOSAL | Status: DC
  Administered 2020-10-08: 15 mL via OROMUCOSAL

## 2020-10-07 MED ORDER — ORAL CARE MOUTH RINSE
15.0000 mL | Freq: Two times a day (BID) | OROMUCOSAL | Status: DC
  Administered 2020-10-08 – 2020-10-12 (×8): 15 mL via OROMUCOSAL

## 2020-10-07 MED ORDER — DILTIAZEM HCL 25 MG/5ML IV SOLN
10.0000 mg | Freq: Once | INTRAVENOUS | Status: AC
  Administered 2020-10-07: 10 mg via INTRAVENOUS
  Filled 2020-10-07: qty 5

## 2020-10-07 MED ORDER — SODIUM CHLORIDE 0.9 % IV BOLUS
1000.0000 mL | Freq: Once | INTRAVENOUS | Status: AC
  Administered 2020-10-07: 1000 mL via INTRAVENOUS

## 2020-10-07 MED ORDER — ASPIRIN 81 MG PO CHEW
324.0000 mg | CHEWABLE_TABLET | Freq: Once | ORAL | Status: AC
  Administered 2020-10-07: 324 mg via ORAL
  Filled 2020-10-07: qty 4

## 2020-10-07 NOTE — ED Notes (Signed)
Date and time results received: 10/07/20 3:18 PM  Test: Troponin Critical Value: 155  Name of Provider Notified: Francesco Sor, MD  Orders Received? Or Actions Taken?: none at this time

## 2020-10-07 NOTE — Progress Notes (Signed)
Called by bedside nurse with concern for asx bradycardia with HR 30-40. Previous ECG with AF rates 80s. Patient A&O x4 and conversant, no chest pain/pressure or other sx. On tikosyn 125 mcg PO bid (LD 2123). Also on coreg 6.25 mg PO bid (LD 1909) and diltiazem gtt at 5 mg/h. Obtained repeat ECG and RRT nurse assessed at bedside. Confirmed patient currently conversant and asx. ECG with junctional bradycardia. Recommended d/c all nodal blocking agents (dilt/coreg) as well as hold tikosyn for now. Now that she has chemically cardioverted the bradycardia may be 2/2 nodal blocking agents however given c/f WCT by bedside nurse earlier in the day along with junctional bradycardia now would currently hold tikosyn given reverse use dependence and risk for TDP. QTc currently not prolonged. Patient transported to ICU for closer monitoring overnight. Cardiology consults to follow in AM. Continue heparin gtt now that she has cardioverted, if she returns to NSR will be able to transition back to [place] but hold off for now until we can assess whether HR returns to NSR from junctional bradycardia. If persistent junctional bradycardia despite nodal agents washing out will have to evaluate whether this is tachy brady syndrome.    If questions overnight page [phone]

## 2020-10-07 NOTE — Progress Notes (Signed)
Night provider NP [place] notified of troponin 639. No CP.

## 2020-10-07 NOTE — ED Provider Notes (Signed)
[facility] DEPT Provider Note   CSN: [id number] Arrival date & time: 10/07/20  1126     History Chief Complaint  Patient presents with  . Chest Pain    Barbara [name] is a 79 y.o. female.  Patient states around 6 AM she is try to help her husband off the floor and she started having chest discomfort.  She also noticed palpitations.  When the patient arrived in the emergency department she was in atrial fibrillation rate about 150  The history is provided by the patient, medical records and a significant other. No language interpreter was used.  Chest Pain Pain location:  L chest Pain quality: aching   Pain radiates to:  Does not radiate Pain severity:  Moderate Onset quality:  Sudden Timing:  Constant Progression:  Worsening Chronicity:  New Context: not breathing   Associated symptoms: palpitations   Associated symptoms: no abdominal pain, no back pain, no cough, no fatigue and no headache        Past Medical History:  Diagnosis Date  . A-fib ([place])   . Allergy   . CAD (coronary artery disease)    sees Dr. Shelva Majestic  cardiac stents - 2000  . CHF (congestive heart failure) ([place])   . Colon polyps   . Complication of anesthesia    rash/hives with "caines"  . Dyspnea    02/12/18 " when my heart gets out of rhythm, it has not been out of rhythym- since I have been on Tikosyn (11/2017)  . Dysrhythmia    afib fib  . GERD (gastroesophageal reflux disease)    takes OTC- Omeprazole- prn  . Heart murmur   . History of stress test    show normal perfusion without scar or ischemia, post EF 68%  . Hx of echocardiogram    show an EF 55%-60% range with grade 1 diastolic dysfunction, she had mitral anular calcification with mild MR, moderate LA dilation and mild pulmonary hypertension with a PA estimated pressure of 46mm  . Hyperlipidemia   . Hypertension   . NSTEMI (non-ST elevated myocardial infarction) ([place])   . Osteoarthritis   .  Pneumonia    hx of 2015   . PONV (postoperative nausea and vomiting)     Patient Active Problem List   Diagnosis Date Noted  . Atrial fibrillation, rapid ([place]) 10/07/2020  . CKD (chronic kidney disease), stage III ([place]) 05/20/2020  . Apical variant hypertrophic cardiomyopathy ([place])   . Chest pain of uncertain etiology   . Chest pain   . Elevated troponin   . Pure hypercholesterolemia   . 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 PAF (paroxysmal atrial fibrillation) ([place]) 02/28/2019  . NSTEMI (non-ST elevated myocardial infarction) ([place]) 02/25/2019  . Pain in left knee 01/06/2018  . Dysphonia 12/29/2017  . 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 CHF (congestive heart failure) ([place]) 11/16/2017  . Gastroesophageal reflux disease 11/06/2017  . Bradycardia 12/27/2015  . Exertional dyspnea 12/23/2014  . Preoperative clearance 12/23/2014  . Hyperlipidemia LDL goal <70 09/13/2014  . Sinus bradycardia 09/13/2014  . LUMBAGO 05/03/2010  . VERTIGO 01/21/2009  . TEMPOROMANDIBULAR JOINT PAIN 08/31/2008  . Osteoarthritis 05/31/2008  . 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 HYPERLIPIDEMIA 04/06/2007  . Essential hypertension 04/06/2007  . CAD (coronary artery disease) 04/06/2007  . ALLERGIC RHINITIS 04/06/2007  . Personal history of colonic polyps 04/06/2007    Past Surgical History:  Procedure Laterality Date  . CARDIAC CATHETERIZATION  11/2017  . cardiac stents  2000  . COLONOSCOPY  01-05-14   per Dr. Teena Irani, clear, no repeats needed   . COLONOSCOPY    . CORONARY STENT PLACEMENT  2000   in [facility]  . DIRECT LARYNGOSCOPY WITH RADIAESSE INJECTION N/A 02/13/2018   Procedure: DIRECT LARYNGOSCOPY WITH RADIAESSE INJECTION;  Surgeon: Melida Quitter, MD;  Location: [facility];  Service: ENT;  Laterality: N/A;  . EYE SURGERY Left    CATARACT REMOVAL  . KNEE ARTHROSCOPY Left 01/06/2015   Procedure: LEFT KNEE ARTHROSCOPY, abrasion chondroplasty of the medial femerol  condryl,medial and lateral menisectomy, microfracture , synovectomy of the suprpatellar pouch;  Surgeon: Latanya Maudlin, MD;  Location: [facility];  Service: Orthopedics;  Laterality: Left;  . LEFT HEART CATH AND CORONARY ANGIOGRAPHY N/A 02/26/2019   Procedure: LEFT HEART CATH AND CORONARY ANGIOGRAPHY;  Surgeon: Troy Sine, MD;  Location: [facility];  Service: Cardiovascular;  Laterality: N/A;  . MICROLARYNGOSCOPY W/VOCAL CORD INJECTION N/A 08/07/2018   Procedure: MICROLARYNGOSCOPY WITH VOCAL CORD INJECTION OF PROLARYN;  Surgeon: Melida Quitter, MD;  Location: [facility];  Service: ENT;  Laterality: N/A;  JET VENTILATION  . REVERSE SHOULDER ARTHROPLASTY Right 03/23/2020   Procedure: REVERSE SHOULDER ARTHROPLASTY;  Surgeon: Nicholes Stairs, MD;  Location: [facility];  Service: Orthopedics;  Laterality: Right;  Marland Kitchen VAGINAL HYSTERECTOMY  1971     OB History   No obstetric history on file.     Family History  Problem Relation Age of Onset  . Cancer Maternal Grandmother   . Heart disease Maternal Grandfather     Social History   Tobacco Use  . Smoking status: Former Smoker    Years: 40.00    Quit date: 11/04/1996    Years since quitting: 23.9  . Smokeless tobacco: Never Used  Vaping Use  . Vaping Use: Never used  Substance Use Topics  . Alcohol use: No    Alcohol/week: 0.0 standard drinks  . Drug use: No    Home Medications Prior to Admission medications   Medication Sig Start Date End Date Taking? Authorizing Provider  acetaminophen (TYLENOL) 500 MG tablet Take 1,000 mg by mouth every 6 (six) hours as needed for mild pain or headache.    [provider]  aspirin EC 81 MG EC tablet Take 1 tablet (81 mg total) by mouth daily. Swallow whole. 05/20/20   Kroeger, Lorelee Cover., PA-C  Biotin 5000 MCG SUBL Place 10,000 mcg under the tongue daily.    [provider]  candesartan (ATACAND) 16 MG tablet Take 1 tablet (16 mg total) by mouth daily. 02/25/20   Troy Sine, MD   Cholecalciferol (VITAMIN D3) 2000 units TABS Take 2,000 Units by mouth daily.    [provider]  dextromethorphan (DELSYM) 30 MG/5ML liquid Take 15 mg by mouth daily as needed for cough.     [provider]  dofetilide (TIKOSYN) 125 MCG capsule Take 1 capsule (125 mcg total) by mouth 2 (two) times daily. 06/05/20   Troy Sine, MD  ELIQUIS 5 MG TABS tablet TAKE 1 TABLET TWICE A DAY 06/26/20   Troy Sine, MD  HOMEOPATHIC PRODUCTS PO Place 2 tablets under the tongue See admin instructions. [employer] Pain and Fatigue Relief (Aconitum napellus 30C HPUS Arsenicum album 30C HPUS Belladonna 30C HPUS Coniinum 30C HPUS Gelsemium sempervirens 30CHPUS Hypericum perforatum 3X, 30C HPUS Kali bichromicum 30C Phosphoricum acidum 30CHPUS Rhus tox 30C HPUS Uricum acidum 30C HPUS Lacticum acidum 30C HPUS Lactose,  Magnesium Stearate, Microcrystalline Cellulose)- Dissolve 2 tablets under the tongue three times a day    [provider]  hypromellose (VISTA GEL DRY EYE RELIEF) 0.3 % GEL ophthalmic ointment Place 1 application into both eyes at bedtime.    [provider]  ipratropium (ATROVENT) 0.06 % nasal spray Place 2 sprays into both nostrils daily as needed for rhinitis.  04/28/18   [provider]  ketotifen (ALAWAY) 0.025 % ophthalmic solution Place 1 drop into both eyes 2 (two) times daily as needed (eye irritation.).    [provider]  loratadine (CLARITIN) 10 MG tablet Take 10 mg by mouth daily as needed (allergies/sinus issues).     [provider]  methocarbamol (ROBAXIN) 500 MG tablet Take 1 tablet (500 mg total) by mouth every 6 (six) hours as needed for muscle spasms. 03/24/20   Nicholes Stairs, MD  Multiple Vitamin (MULTIVITAMIN WITH MINERALS) TABS tablet Take 1 tablet by mouth daily.    [provider]  nitroGLYCERIN (NITROSTAT) 0.4 MG SL tablet Place 1 tablet (0.4 mg total) under the tongue every 5 (five) minutes x 3 doses  as needed for chest pain. 05/19/20   Kroeger, Lorelee Cover., PA-C  omeprazole (PRILOSEC OTC) 20 MG tablet Take 20 mg by mouth daily as needed (for acid reflux).     [provider]  pantoprazole (PROTONIX) 40 MG tablet Take 1 tablet (40 mg total) by mouth daily. 08/23/20   Troy Sine, MD  PRESCRIPTION MEDICATION See admin instructions. CPAP- At bedtime    [provider]  rosuvastatin (CRESTOR) 40 MG tablet Take 1 tablet (40 mg total) by mouth daily. 02/25/20   Troy Sine, MD  spironolactone (ALDACTONE) 25 MG tablet Take 1 tablet (25 mg total) by mouth daily. 02/25/20   Troy Sine, MD  triamcinolone (NASACORT) 55 MCG/ACT nasal inhaler Place 1 spray into both nostrils daily as needed (for allergies).  10/17/12   Laurey Morale, MD    Allergies    Lidocaine, Morphine, Procaine hcl, Sulfonamide derivatives, Norco [hydrocodone-acetaminophen], Tramadol, Vytorin [ezetimibe-simvastatin], and Tape  Review of Systems   Review of Systems  Constitutional: Negative for appetite change and fatigue.  HENT: Negative for congestion, ear discharge and sinus pressure.   Eyes: Negative for discharge.  Respiratory: Negative for cough.   Cardiovascular: Positive for chest pain and palpitations.  Gastrointestinal: Negative for abdominal pain and diarrhea.  Genitourinary: Negative for frequency and hematuria.  Musculoskeletal: Negative for back pain.  Skin: Negative for rash.  Neurological: Negative for seizures and headaches.  Psychiatric/Behavioral: Negative for hallucinations.    Physical Exam Updated Vital Signs BP 115/65   Pulse 75   Temp 97.9 F (36.6 C) (Oral)   Resp 14   SpO2 100%   Physical Exam Vitals and nursing note reviewed.  Constitutional:      Appearance: She is well-developed.  HENT:     Head: Normocephalic.     Nose: Nose normal.  Eyes:     General: No scleral icterus.    Conjunctiva/sclera: Conjunctivae normal.  Neck:     Thyroid: No thyromegaly.   Cardiovascular:     Rate and Rhythm: Normal rate and regular rhythm.     Heart sounds: No murmur heard.  No friction rub. No gallop.   Pulmonary:     Breath sounds: No stridor. No wheezing or rales.  Chest:     Chest wall: No tenderness.  Abdominal:     General: There is no distension.  Tenderness: There is no abdominal tenderness. There is no rebound.  Musculoskeletal:        General: Normal range of motion.     Cervical back: Neck supple.  Lymphadenopathy:     Cervical: No cervical adenopathy.  Skin:    Findings: No erythema or rash.  Neurological:     Mental Status: She is alert and oriented to person, place, and time.     Motor: No abnormal muscle tone.     Coordination: Coordination normal.  Psychiatric:        Behavior: Behavior normal.     ED Results / Procedures / Treatments   Labs (all labs ordered are listed, but only abnormal results are displayed) Labs Reviewed  BASIC METABOLIC PANEL - Abnormal; Notable for the following components:      Result Value   CO2 21 (*)    Glucose, Bld 113 (*)    BUN 31 (*)    Creatinine, Ser 1.30 (*)    GFR, Estimated 41 (*)    All other components within normal limits  TROPONIN I (HIGH SENSITIVITY) - Abnormal; Notable for the following components:   Troponin I (High Sensitivity) 79 (*)    All other components within normal limits  RESP PANEL BY RT-PCR (FLU A&B, COVID) ARPGX2  CBC  TROPONIN I (HIGH SENSITIVITY)    EKG None  Radiology DG Chest Port 1 View  Result Date: 10/07/2020 CLINICAL DATA:  Chest pain beginning this morning. EXAM: PORTABLE CHEST 1 VIEW COMPARISON:  05/17/2020 FINDINGS: Cardiac silhouette is normal in size. No mediastinal or hilar masses or evidence of adenopathy. Mild linear opacity at the left lung base consistent with scarring or atelectasis, stable. Lungs otherwise clear. No convincing pleural effusion.  No pneumothorax. 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 No acute skeletal  abnormality. IMPRESSION: No active disease. Electronically Signed   By: Lajean Manes M.D.   On: 10/07/2020 12:22    Procedures Procedures (including critical care time)  Medications Ordered in ED Medications  diltiazem (CARDIZEM) 125 mg in dextrose 5% 125 mL (1 mg/mL) infusion (5 mg/hr Intravenous New Bag/Given 10/07/20 1200)  sodium chloride 0.9 % bolus 1,000 mL (0 mLs Intravenous Stopped 10/07/20 1343)  diltiazem (CARDIZEM) injection 10 mg (10 mg Intravenous Given 10/07/20 1200)   CRITICAL CARE Performed by: Milton Ferguson Total critical care time: 40 minutes Critical care time was exclusive of separately billable procedures and treating other patients. Critical care was necessary to treat or prevent imminent or life-threatening deterioration. Critical care was time spent personally by me on the following activities: development of treatment plan with patient and/or surrogate as well as nursing, discussions with consultants, evaluation of patient's response to treatment, examination of patient, obtaining history from patient or surrogate, ordering and performing treatments and interventions, ordering and review of laboratory studies, ordering and review of radiographic studies, pulse oximetry and re-evaluation of patient's condition. Patient was placed on a diltiazem drip ED Course  I have reviewed the triage vital signs and the nursing notes.  Pertinent labs & imaging results that were available during my care of the patient were reviewed by me and considered in my medical decision making (see chart for details). I spoke with cardiology about the rapid A. fib and elevated troponin.  They stated to cycle the patient enzymes and they will see the patient tomorrow unless they are call sooner by the hospitalist service   MDM Rules/Calculators/A&P  Patient will be admitted by the hospitalist for atrial fibrillation rapid ventricular rate.  Final Clinical Impression(s) /  ED Diagnoses Final diagnoses:  Atrial fibrillation with RVR [facility])    Rx / DC Orders ED Discharge Orders    None       Milton Ferguson, MD 10/07/20 1430

## 2020-10-07 NOTE — Progress Notes (Signed)
ANTICOAGULATION CONSULT NOTE - Initial Consult  Pharmacy Consult for heparin Indication: Atrial fibrillation & possible NSTEMI  Allergies  Allergen Reactions  . Lidocaine Anaphylaxis  . Morphine Other (See Comments)    "Body shuts down"  . Procaine Hcl Anaphylaxis, Rash and Other (See Comments)    "Anything with 'caine' in it "  . Sulfonamide Derivatives Hives  . Norco [Hydrocodone-Acetaminophen] Other (See Comments)    "Made me sick"  . Tramadol Nausea Only  . Vytorin [Ezetimibe-Simvastatin] Other (See Comments)    Unknown  . Tape Itching and Other (See Comments)    Patient prefers either paper tape or Coban wrap    Patient Measurements: Height: 5\' 7"  (170.2 cm) Weight: 73.5 kg (162 lb) (pt stated weighed this am ) IBW/kg (Calculated) : 61.6 Heparin Dosing Weight: 73.5 kg  Vital Signs: Temp: 98.6 F (37 C) (12/04 1738) Temp Source: Oral (12/04 1738) BP: 158/66 (12/04 1738) Pulse Rate: 78 (12/04 1738)  Labs: Recent Labs    10/07/20 1152 10/07/20 1406  HGB 12.3  --   HCT 38.2  --   PLT 184  --   CREATININE 1.30*  --   TROPONINIHS 79* 155*    Estimated Creatinine Clearance: 33 mL/min (A) (by C-G formula based on SCr of 1.3 mg/dL (H)).   Medical History: Past Medical History:  Diagnosis Date  . A-fib ([place])   . Allergy   . CAD (coronary artery disease)    sees Dr. Shelva Majestic  cardiac stents - 2000  . CHF (congestive heart failure) ([place])   . Colon polyps   . Complication of anesthesia    rash/hives with "caines"  . Dyspnea    02/12/18 " when my heart gets out of rhythm, it has not been out of rhythym- since I have been on Tikosyn (11/2017)  . Dysrhythmia    afib fib  . GERD (gastroesophageal reflux disease)    takes OTC- Omeprazole- prn  . Heart murmur   . History of stress test    show normal perfusion without scar or ischemia, post EF 68%  . Hx of echocardiogram    show an EF 55%-60% range with grade 1 diastolic dysfunction, she had mitral anular  calcification with mild MR, moderate LA dilation and mild pulmonary hypertension with a PA estimated pressure of 31mm  . Hyperlipidemia   . Hypertension   . NSTEMI (non-ST elevated myocardial infarction) ([place])   . Osteoarthritis   . Pneumonia    hx of 2015   . PONV (postoperative nausea and vomiting)     Medications:  Medications Prior to Admission  Medication Sig Dispense Refill Last Dose  . acetaminophen (TYLENOL) 500 MG tablet Take 1,000 mg by mouth at bedtime.    10/06/2020 at pm  . amoxicillin (AMOXIL) 500 MG tablet Take 2,000 mg by mouth See admin instructions. Take 4 tablets (2000 mg) by mouth one hour prior to dental appointments   month ago  . aspirin EC 81 MG EC tablet Take 1 tablet (81 mg total) by mouth daily. Swallow whole. 30 tablet 11 10/07/2020 at am  . candesartan (ATACAND) 16 MG tablet Take 1 tablet (16 mg total) by mouth daily. 90 tablet 3 10/07/2020 at am  . Cholecalciferol (VITAMIN D3) 2000 units TABS Take 2,000 Units by mouth daily.   10/07/2020 at am  . dextromethorphan (DELSYM) 30 MG/5ML liquid Take 15 mg by mouth daily as needed for cough.    month ago  . diclofenac Sodium (VOLTAREN) 1 %  GEL Apply 1 application topically 4 (four) times daily as needed (knee pain).    10/06/2020 at pm  . dofetilide (TIKOSYN) 125 MCG capsule Take 1 capsule (125 mcg total) by mouth 2 (two) times daily. 180 capsule 1 10/07/2020 at 9am  . ELIQUIS 5 MG TABS tablet TAKE 1 TABLET TWICE A DAY (Patient taking differently: Take 5 mg by mouth 2 (two) times daily. ) 180 tablet 1 10/07/2020 at 9am  . hypromellose (VISTA GEL DRY EYE RELIEF) 0.3 % GEL ophthalmic ointment Place 1 application into both eyes at bedtime as needed for dry eyes.    2-3 weeks ago  . ipratropium (ATROVENT) 0.06 % nasal spray Place 2 sprays into both nostrils daily as needed for rhinitis.   5 10/05/2020  . ketotifen (ALAWAY) 0.025 % ophthalmic solution Place 1 drop into both eyes 2 (two) times daily as needed (eye  irritation/allergy).    2 weeks ago  . loratadine (CLARITIN) 10 MG tablet Take 10 mg by mouth daily as needed (allergies/sinus issues).    10/05/2020  . Multiple Vitamin (MULTIVITAMIN WITH MINERALS) TABS tablet Take 1 tablet by mouth daily.   10/07/2020 at am  . nitroGLYCERIN (NITROSTAT) 0.4 MG SL tablet Place 1 tablet (0.4 mg total) under the tongue every 5 (five) minutes x 3 doses as needed for chest pain. 25 tablet 3 10/07/2020 at 9am  . pantoprazole (PROTONIX) 40 MG tablet Take 1 tablet (40 mg total) by mouth daily. 90 tablet 2 10/07/2020 at am  . PRESCRIPTION MEDICATION See admin instructions. CPAP- At bedtime   10/06/2020 at pm  . rosuvastatin (CRESTOR) 40 MG tablet Take 1 tablet (40 mg total) by mouth daily. (Patient taking differently: Take 40 mg by mouth at bedtime. ) 90 tablet 3 10/06/2020 at pm  . spironolactone (ALDACTONE) 25 MG tablet Take 1 tablet (25 mg total) by mouth daily. 90 tablet 3 10/07/2020 at am  . triamcinolone (NASACORT) 55 MCG/ACT nasal inhaler Place 1 spray into both nostrils daily as needed (for allergies).    Past Week at Unknown time  . methocarbamol (ROBAXIN) 500 MG tablet Take 1 tablet (500 mg total) by mouth every 6 (six) hours as needed for muscle spasms. (Patient not taking: Reported on 10/07/2020) 50 tablet 0 Not Taking at Unknown time    Assessment: 79 yo F on Eliquis 5 mg po bid PTA for Afib.  Pharmacy consulted to dose heparin for atrial fibrillation and possible NSTEMI.   Eliquis 5 mg po bid PTA> last dose 12/4 at 09 am. SCr 1.3, CBC WNL.    Goal of Therapy:  Heparin level 0.3-0.7 units/ml aPTT 66-102 seconds Monitor platelets by anticoagulation protocol: Yes   Plan:  Draw baseline heparin level & aPTT now to see effect of Eliquis on heparin level At 2000 tonight start heparin drip at 1050 units/hr and check 8 hr aPTT/heparin level Daily aPTT/heparin level and daily CBC  Eudelia Bunch, Pharm.D 10/07/2020 6:26 PM

## 2020-10-07 NOTE — Progress Notes (Signed)
CSW received a call from the ED stating pt's husband has dementia and is home alone.  CSW called the pt who stated she preferred to go home but that her plan is to let her sister know she is going to be admitted so that the pt's sister will go to the pt's home and stay with the pt's husband.  Pt stated her sister has already confirmed that she will go and stay with the pt's husband until the pt returns home.  Per the pt the pt's sister lives within walking distance of the pt's home.  CSW advised the pt that he is standing by should any additional social work needs need to be addressed.  Pt's was appreciative and thanked the CSW.  Please reconsult if future social work needs arise.  CSW signing off, as social work intervention is no longer needed.  Alphonse Guild. Jaaziah Schulke  MSW, LCSW, LCAS, CCS Transitions of Care Clinical Social Worker Care Coordination Department Ph: [phone]

## 2020-10-07 NOTE — Progress Notes (Signed)
Troponin 737 paged to cardiology unassigned number. Charge nurse notified. Pt has no sx currently. VS stable. Heparin drip and card drip running.

## 2020-10-07 NOTE — Progress Notes (Signed)
Spoke with Dr. Nadyne Coombes regarding pt. No cp, continue to monitor and conitue heparin.

## 2020-10-07 NOTE — ED Triage Notes (Addendum)
Pt presents with c/o chest pain that around 5 am this morning. Pt reports her husband had to get some help from EMS around 4 am to get off of the floor after a fall, and after that, she began to have some chest pain. Pt reports that between 6a-9a she took 4 nitro pills. Pt reports the pain is in the center of her chest and she is having some left arm pain and tingling as well. Pt reports a hx of afib.

## 2020-10-07 NOTE — Progress Notes (Addendum)
   Spoke w/ RN and communicated w/ pt in the room.   Pt pain has improved, she is resting comfortably.   Pt has missed only 1 dose of Tikosyn in the past week, took rx this am.  With elevation in troponin, will hold Eliquis and start heparin per pharmacy.   With Atrial fib, curbsided Dr Caryl Comes on continuing her Tikosyn. He says we should continue it for now.  While moving pt, she had some WCT, RN was concerned it was VT.   Give ASA 324 mg, Add a BB, continue Tikosyn. Check lipids  Pt will be seen by Cards in am.   Rosaria Ferries, PA-C 10/07/2020 6:04 PM

## 2020-10-07 NOTE — H&P (Signed)
History and Physical    Barbara [name] BTD:[id number] DOB: 1940/11/27 DOA: 10/07/2020  PCP: Laurey Morale, MD  Patient coming from: home, lives with her husband who she takes care of Chief Complaint: tachycardia  HPI: Barbara [name] is a 79 y.o. female  "Barbara [name] is a 79 y.o. female who has CAD and in November 2000 underwent stenting of a high-grade LAD stenosis with an S670 3.518 mm bare-metal stent. A nuclear perfusion study in November 2013 was unchanged from previously and continued to show normal perfusion without scar or ischemia. Ejection fraction was 68%. An echo Doppler study revealed an ejection fraction in the 55-60% range with grade 1 diastolic dysfunction. She had mild mitral annular calcification with mild MR, moderate LA dilatation, and mild pulmonary hypertension with estimated pressure 39 mm" followed by [facility] cardiology by Dr. Claiborne Billings who presents to the ED with tachycardia and chest pain.  Patient stated that her husband fell on the floor this morning and she had to call EMS to come pick him up.  While EMS was there she started having palpitations with a warm sensation, no presyncope and had a little bit of chest pain that was typical.  EMS brought her to the emergency department where she was found to have a rate of 150.  Patient states this is similar to her episode in September where they just thought it was from heartburn.  We had social work talk to the patient about who might take care of her husband at home.  Patient states she is fairly adherent to her medications but does admit that last week she did miss a dose of Eliquis.  While on diltiazem drip she felt perfectly fine and her rate was controlled and still in atrial fibrillation.  Her troponin gradually increased up to 700 in the evening  Otherwise, in the ED, WBC 6.0, Hgb 12.3, K4.7, NA 138, CO2 21, SCR 1.3 s/p ASA 325mg  in the ED Troponin was 70 and ED provider spoke with  cardiology. -diltiazem drip with control -Cardiology consult in the am probably Started heparin gtt  Review of Systems: As per HPI otherwise 10 point review of systems negative.  Other pertinents as below:  General -denies any new headaches, recent illness HEENT -denies any visual changes or signs of stroke Cardio -as per HPI, palpitations, denies any presyncope Resp -denies any shortness of breath now, no cough GI -denies any nausea, vomiting, diarrhea, recent GI bug GU -denies any urinary changes MSK -has difficulty lifting heavy things Skin -denies any skin changes Neuro -denies any new numbness or weakness Psych -denies any mood changes psych depression but she is stressed about taking care of her husband   Past Medical History:  Diagnosis Date  . A-fib ([place])   . Allergy   . CAD (coronary artery disease)    sees Dr. Shelva Majestic  cardiac stents - 2000  . CHF (congestive heart failure) ([place])   . Colon polyps   . Complication of anesthesia    rash/hives with "caines"  . Dyspnea    02/12/18 " when my heart gets out of rhythm, it has not been out of rhythym- since I have been on Tikosyn (11/2017)  . Dysrhythmia    afib fib  . GERD (gastroesophageal reflux disease)    takes OTC- Omeprazole- prn  . Heart murmur   . History of stress test    show normal perfusion without scar or ischemia, post EF 68%  . Hx of echocardiogram  show an EF 55%-60% range with grade 1 diastolic dysfunction, she had mitral anular calcification with mild MR, moderate LA dilation and mild pulmonary hypertension with a PA estimated pressure of 53mm  . Hyperlipidemia   . Hypertension   . NSTEMI (non-ST elevated myocardial infarction) ([place])   . Osteoarthritis   . Pneumonia    hx of 2015   . PONV (postoperative nausea and vomiting)     Past Surgical History:  Procedure Laterality Date  . CARDIAC CATHETERIZATION     11/2017  . cardiac stents  2000  . COLONOSCOPY  01-05-14   per Dr. Teena Irani,  clear, no repeats needed   . COLONOSCOPY    . CORONARY STENT PLACEMENT  2000   in LAD  . DIRECT LARYNGOSCOPY WITH RADIAESSE INJECTION N/A 02/13/2018   Procedure: DIRECT LARYNGOSCOPY WITH RADIAESSE INJECTION;  Surgeon: Melida Quitter, MD;  Location: [place];  Service: ENT;  Laterality: N/A;  . EYE SURGERY Left    CATARACT REMOVAL  . KNEE ARTHROSCOPY Left 01/06/2015   Procedure: LEFT KNEE ARTHROSCOPY, abrasion chondroplasty of the medial femerol condryl,medial and lateral menisectomy, microfracture , synovectomy of the suprpatellar pouch;  Surgeon: Latanya Maudlin, MD;  Location: [facility];  Service: Orthopedics;  Laterality: Left;  . LEFT HEART CATH AND CORONARY ANGIOGRAPHY N/A 02/26/2019   Procedure: LEFT HEART CATH AND CORONARY ANGIOGRAPHY;  Surgeon: Troy Sine, MD;  Location: [place] [facility];  Service: Cardiovascular;  Laterality: N/A;  . MICROLARYNGOSCOPY W/VOCAL CORD INJECTION N/A 08/07/2018   Procedure: MICROLARYNGOSCOPY WITH VOCAL CORD INJECTION OF PROLARYN;  Surgeon: Melida Quitter, MD;  Location: [place];  Service: ENT;  Laterality: N/A;  JET VENTILATION  . REVERSE SHOULDER ARTHROPLASTY Right 03/23/2020   Procedure: REVERSE SHOULDER ARTHROPLASTY;  Surgeon: Nicholes Stairs, MD;  Location: [place];  Service: Orthopedics;  Laterality: Right;  Marland Kitchen VAGINAL HYSTERECTOMY  1971     reports that she quit smoking about 23 years ago. She quit after 40.00 years of use. She has never used smokeless tobacco. She reports that she does not drink alcohol and does not use drugs.  Allergies  Allergen Reactions  . Lidocaine Anaphylaxis  . Morphine Other (See Comments)    "Body shuts down"  . Procaine Hcl Anaphylaxis, Rash and Other (See Comments)    "Anything with 'caine' in it "  . Sulfonamide Derivatives Hives  . Norco [Hydrocodone-Acetaminophen] Other (See Comments)    "Made me sick"  . Tramadol Nausea Only  . Vytorin [Ezetimibe-Simvastatin] Other (See Comments)    Unknown  . Tape Itching and  Other (See Comments)    Patient prefers either paper tape or Coban wrap    Family History  Problem Relation Age of Onset  . Cancer Maternal Grandmother   . Heart disease Maternal Grandfather     Prior to Admission medications   Medication Sig Start Date End Date Taking? Authorizing Provider  acetaminophen (TYLENOL) 500 MG tablet Take 1,000 mg by mouth every 6 (six) hours as needed for mild pain or headache.    [provider]  aspirin EC 81 MG EC tablet Take 1 tablet (81 mg total) by mouth daily. Swallow whole. 05/20/20   Kroeger, Lorelee Cover., PA-C  Biotin 5000 MCG SUBL Place 10,000 mcg under the tongue daily.    [provider]  candesartan (ATACAND) 16 MG tablet Take 1 tablet (16 mg total) by mouth daily. 02/25/20   Troy Sine, MD  Cholecalciferol (VITAMIN D3) 2000 units TABS Take 2,000  Units by mouth daily.    [provider]  dextromethorphan (DELSYM) 30 MG/5ML liquid Take 15 mg by mouth daily as needed for cough.     [provider]  dofetilide (TIKOSYN) 125 MCG capsule Take 1 capsule (125 mcg total) by mouth 2 (two) times daily. 06/05/20   Troy Sine, MD  ELIQUIS 5 MG TABS tablet TAKE 1 TABLET TWICE A DAY 06/26/20   Troy Sine, MD  HOMEOPATHIC PRODUCTS PO Place 2 tablets under the tongue See admin instructions. [employer] Pain and Fatigue Relief (Aconitum napellus 30C HPUS Arsenicum album 30C HPUS Belladonna 30C HPUS Coniinum 30C HPUS Gelsemium sempervirens 30CHPUS Hypericum perforatum 3X, 30C HPUS Kali bichromicum 30C Phosphoricum acidum 30CHPUS Rhus tox 30C HPUS Uricum acidum 30C HPUS Lacticum acidum 30C HPUS Lactose, Magnesium Stearate, Microcrystalline Cellulose)- Dissolve 2 tablets under the tongue three times a day    [provider]  hypromellose (VISTA GEL DRY EYE RELIEF) 0.3 % GEL ophthalmic ointment Place 1 application into both eyes at bedtime.    [provider]  ipratropium (ATROVENT) 0.06 % nasal spray Place 2  sprays into both nostrils daily as needed for rhinitis.  04/28/18   [provider]  ketotifen (ALAWAY) 0.025 % ophthalmic solution Place 1 drop into both eyes 2 (two) times daily as needed (eye irritation.).    [provider]  loratadine (CLARITIN) 10 MG tablet Take 10 mg by mouth daily as needed (allergies/sinus issues).     [provider]  methocarbamol (ROBAXIN) 500 MG tablet Take 1 tablet (500 mg total) by mouth every 6 (six) hours as needed for muscle spasms. 03/24/20   Nicholes Stairs, MD  Multiple Vitamin (MULTIVITAMIN WITH MINERALS) TABS tablet Take 1 tablet by mouth daily.    [provider]  nitroGLYCERIN (NITROSTAT) 0.4 MG SL tablet Place 1 tablet (0.4 mg total) under the tongue every 5 (five) minutes x 3 doses as needed for chest pain. 05/19/20   Kroeger, Lorelee Cover., PA-C  omeprazole (PRILOSEC OTC) 20 MG tablet Take 20 mg by mouth daily as needed (for acid reflux).     [provider]  pantoprazole (PROTONIX) 40 MG tablet Take 1 tablet (40 mg total) by mouth daily. 08/23/20   Troy Sine, MD  PRESCRIPTION MEDICATION See admin instructions. CPAP- At bedtime    [provider]  rosuvastatin (CRESTOR) 40 MG tablet Take 1 tablet (40 mg total) by mouth daily. 02/25/20   Troy Sine, MD  spironolactone (ALDACTONE) 25 MG tablet Take 1 tablet (25 mg total) by mouth daily. 02/25/20   Troy Sine, MD  triamcinolone (NASACORT) 55 MCG/ACT nasal inhaler Place 1 spray into both nostrils daily as needed (for allergies).  10/17/12   Laurey Morale, MD    Physical Exam: Vitals:   10/07/20 1639 10/07/20 1738 10/07/20 1800 10/07/20 1909  BP:  (!) 158/66 107/62 107/62  Pulse: 79 78 60 84  Resp: 17 19 18    Temp:  98.6 F (37 C)    TempSrc:  Oral    SpO2: 100%  100%   Weight:  73.5 kg    Height:  5\' 7"  (1.702 m)      Constitutional: NAD, comfortable, mentating appropriately Eyes: pupils equal and reactive to light, anicteric,  without injection ENMT: MMM, throat without exudates or erythema Neck: normal, supple, no masses, no thyromegaly noted Respiratory: CTAB, nwob Cardiovascular: irregular rhythm, a murmur Abdomen: NBS, NT,   Musculoskeletal: moving all 4 extremities, strength  grossly intact 5/5 in the UE and LE's,  Skin: no rashes, lesions, ulcers. No induration Neurologic: CN 2-12 intact. Sensation intact Psychiatric: AO appearing, mentation appropriate   Labs on Admission: I have personally reviewed following labs and imaging studies  CBC: Recent Labs  Lab 10/07/20 1152  WBC 6.0  HGB 12.3  HCT 38.2  MCV 91.6  PLT 315   Basic Metabolic Panel: Recent Labs  Lab 10/07/20 1152  NA 138  K 4.7  CL 107  CO2 21*  GLUCOSE 113*  BUN 31*  CREATININE 1.30*  CALCIUM 10.3   GFR: Estimated Creatinine Clearance: 33 mL/min (A) (by C-G formula based on SCr of 1.3 mg/dL (H)). Liver Function Tests: No results for input(s): AST, ALT, ALKPHOS, BILITOT, PROT, ALBUMIN in the last 168 hours. No results for input(s): LIPASE, AMYLASE in the last 168 hours. No results for input(s): AMMONIA in the last 168 hours. Coagulation Profile: No results for input(s): INR, PROTIME in the last 168 hours. Cardiac Enzymes: No results for input(s): CKTOTAL, CKMB, CKMBINDEX, TROPONINI in the last 168 hours. BNP (last 3 results) No results for input(s): PROBNP in the last 8760 hours. HbA1C: No results for input(s): HGBA1C in the last 72 hours. CBG: No results for input(s): GLUCAP in the last 168 hours. Lipid Profile: No results for input(s): CHOL, HDL, LDLCALC, TRIG, CHOLHDL, LDLDIRECT in the last 72 hours. Thyroid Function Tests: No results for input(s): TSH, T4TOTAL, FREET4, T3FREE, THYROIDAB in the last 72 hours. Anemia Panel: No results for input(s): VITAMINB12, FOLATE, FERRITIN, TIBC, IRON, RETICCTPCT in the last 72 hours. Urine analysis:    Component Value Date/Time   COLORURINE yellow 10/17/2010 0859    APPEARANCEUR Clear 10/17/2010 0859   LABSPEC 1.015 10/17/2010 0859   PHURINE 5.0 10/17/2010 0859   HGBUR negative 10/17/2010 0859   BILIRUBINUR N 11/06/2017 1131   PROTEINUR N 11/06/2017 1131   UROBILINOGEN 0.2 11/06/2017 1131   UROBILINOGEN 0.2 10/17/2010 0859   NITRITE N 11/06/2017 1131   NITRITE negative 10/17/2010 0859   LEUKOCYTESUR Negative 11/06/2017 1131    Radiological Exams on Admission: DG Chest Port 1 View  Result Date: 10/07/2020 CLINICAL DATA:  Chest pain beginning this morning. EXAM: PORTABLE CHEST 1 VIEW COMPARISON:  05/17/2020 FINDINGS: Cardiac silhouette is normal in size. No mediastinal or hilar masses or evidence of adenopathy. Mild linear opacity at the left lung base consistent with scarring or atelectasis, stable. Lungs otherwise clear. No convincing pleural effusion.  No pneumothorax. 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 No acute skeletal abnormality. IMPRESSION: No active disease. Electronically Signed   By: Lajean Manes M.D.   On: 10/07/2020 12:22    EKG: Independently reviewed. afib with abnormal R wave progression, LVH with TWI's in V3-v6, lateral leads. And inferior leads which is a little worst from september  Assessment/Plan Active Problems:   Essential hypertension   CAD (coronary artery disease)   Hyperlipidemia LDL goal <70   Chest pain   CKD (chronic kidney disease), stage III (HCC)   Atrial fibrillation, rapid [facility])  "Barbara [name] is a 79 y.o. female who has CAD and in November 2000 underwent stenting of a high-grade LAD stenosis with an S670 3.518 mm bare-metal stent. A nuclear perfusion study in November 2013 was unchanged from previously and continued to show normal perfusion without scar or ischemia. Ejection fraction was 68%. An echo Doppler study revealed an ejection fraction in the 55-60% range with grade 1 diastolic dysfunction. She had mild mitral annular calcification  with mild MR, moderate LA dilatation, and  mild pulmonary hypertension with estimated pressure 39 mm" followed by [facility] cardiology by Dr. Claiborne Billings who presents to the ED with tachycardia and chest pain.  NSTEMI for now but possibly demand ischemia, will defer final etiology to cardiology, appreciate their help.  --she states similar presentation in August --ekg shows twi's in v3-v6, ekg in the am --troponins still trending up --defer antiplatelet to cardiology, consider echo in the am --started heparin gtt in the ED -Caution with potential cardioversion as she thought that she missed a dose of Eliquis --ASA 81mg , heparin drip, continue Crestor -Risk stratification labs --continue diltiazem gtt and convert in the am --thank you to the ED for restarting coreg --continue tikosyn --transition back to eliquis when able  Afib w/ RVR w/ h/o atrial fibrillation --tsh as above  CKD III - renally dose, avoid NSAIDs if possible metabolic acidosis similar to prior I wonder if from CKD as above GERD HTN  Patient and/or Family completely agreed with the plan, expressed understanding and I answered all questions.  DVT prophylaxis: [id number] Code Status: Full code Family Communication: n/a Disposition Plan: home  Consults called: Cardiology, talked to overnight provider who said they would pass on to day team Admission status: observation   A total of 70 minutes utilized during this admission.  [facility]   If 7PM-7AM, please contact night-coverage [email or web address] Password [email or web address]  10/07/2020, 9:53 PM

## 2020-10-08 ENCOUNTER — Observation Stay (HOSPITAL_COMMUNITY): Payer: Medicare Other

## 2020-10-08 DIAGNOSIS — K219 Gastro-esophageal reflux disease without esophagitis: Secondary | ICD-10-CM | POA: Diagnosis present

## 2020-10-08 DIAGNOSIS — I2510 Atherosclerotic heart disease of native coronary artery without angina pectoris: Secondary | ICD-10-CM

## 2020-10-08 DIAGNOSIS — R778 Other specified abnormalities of plasma proteins: Secondary | ICD-10-CM | POA: Diagnosis not present

## 2020-10-08 DIAGNOSIS — Z8249 Family history of ischemic heart disease and other diseases of the circulatory system: Secondary | ICD-10-CM | POA: Diagnosis not present

## 2020-10-08 DIAGNOSIS — Z884 Allergy status to anesthetic agent status: Secondary | ICD-10-CM | POA: Diagnosis not present

## 2020-10-08 DIAGNOSIS — Z882 Allergy status to sulfonamides status: Secondary | ICD-10-CM | POA: Diagnosis not present

## 2020-10-08 DIAGNOSIS — Z9109 Other allergy status, other than to drugs and biological substances: Secondary | ICD-10-CM | POA: Diagnosis not present

## 2020-10-08 DIAGNOSIS — R000 Tachycardia, unspecified: Secondary | ICD-10-CM | POA: Diagnosis present

## 2020-10-08 DIAGNOSIS — I4891 Unspecified atrial fibrillation: Secondary | ICD-10-CM | POA: Diagnosis not present

## 2020-10-08 DIAGNOSIS — I214 Non-ST elevation (NSTEMI) myocardial infarction: Secondary | ICD-10-CM | POA: Diagnosis not present

## 2020-10-08 DIAGNOSIS — R001 Bradycardia, unspecified: Secondary | ICD-10-CM

## 2020-10-08 DIAGNOSIS — I422 Other hypertrophic cardiomyopathy: Secondary | ICD-10-CM | POA: Diagnosis present

## 2020-10-08 DIAGNOSIS — Z87891 Personal history of nicotine dependence: Secondary | ICD-10-CM | POA: Diagnosis not present

## 2020-10-08 DIAGNOSIS — Z955 Presence of coronary angioplasty implant and graft: Secondary | ICD-10-CM | POA: Diagnosis not present

## 2020-10-08 DIAGNOSIS — Z96611 Presence of right artificial shoulder joint: Secondary | ICD-10-CM | POA: Diagnosis present

## 2020-10-08 DIAGNOSIS — N1832 Chronic kidney disease, stage 3b: Secondary | ICD-10-CM | POA: Diagnosis present

## 2020-10-08 DIAGNOSIS — R079 Chest pain, unspecified: Secondary | ICD-10-CM

## 2020-10-08 DIAGNOSIS — I340 Nonrheumatic mitral (valve) insufficiency: Secondary | ICD-10-CM

## 2020-10-08 DIAGNOSIS — I130 Hypertensive heart and chronic kidney disease with heart failure and stage 1 through stage 4 chronic kidney disease, or unspecified chronic kidney disease: Secondary | ICD-10-CM | POA: Diagnosis present

## 2020-10-08 DIAGNOSIS — Z90710 Acquired absence of both cervix and uterus: Secondary | ICD-10-CM | POA: Diagnosis not present

## 2020-10-08 DIAGNOSIS — Z20822 Contact with and (suspected) exposure to covid-19: Secondary | ICD-10-CM | POA: Diagnosis present

## 2020-10-08 DIAGNOSIS — Z885 Allergy status to narcotic agent status: Secondary | ICD-10-CM | POA: Diagnosis not present

## 2020-10-08 DIAGNOSIS — I509 Heart failure, unspecified: Secondary | ICD-10-CM | POA: Diagnosis present

## 2020-10-08 DIAGNOSIS — I272 Pulmonary hypertension, unspecified: Secondary | ICD-10-CM | POA: Diagnosis present

## 2020-10-08 DIAGNOSIS — I4819 Other persistent atrial fibrillation: Secondary | ICD-10-CM | POA: Diagnosis present

## 2020-10-08 DIAGNOSIS — Z7901 Long term (current) use of anticoagulants: Secondary | ICD-10-CM | POA: Diagnosis not present

## 2020-10-08 DIAGNOSIS — I248 Other forms of acute ischemic heart disease: Secondary | ICD-10-CM | POA: Diagnosis present

## 2020-10-08 DIAGNOSIS — I10 Essential (primary) hypertension: Secondary | ICD-10-CM

## 2020-10-08 DIAGNOSIS — E872 Acidosis: Secondary | ICD-10-CM | POA: Diagnosis present

## 2020-10-08 DIAGNOSIS — E785 Hyperlipidemia, unspecified: Secondary | ICD-10-CM | POA: Diagnosis present

## 2020-10-08 DIAGNOSIS — Z888 Allergy status to other drugs, medicaments and biological substances status: Secondary | ICD-10-CM | POA: Diagnosis not present

## 2020-10-08 LAB — HEMOGLOBIN A1C
Hgb A1c MFr Bld: 5.7 % — ABNORMAL HIGH (ref 4.8–5.6)
Mean Plasma Glucose: 116.89 mg/dL

## 2020-10-08 LAB — LIPID PANEL
Cholesterol: 111 mg/dL (ref 0–200)
HDL: 49 mg/dL (ref >40)
LDL Cholesterol: 50 mg/dL (ref 0–99)
Total CHOL/HDL Ratio: 2.3 RATIO
Triglycerides: 62 mg/dL (ref <150)
VLDL: 12 mg/dL (ref 0–40)

## 2020-10-08 LAB — CBC
HCT: 36.8 % (ref 36.0–46.0)
HCT: 33.7 % — ABNORMAL LOW (ref 36.0–46.0)
Hemoglobin: 11.8 g/dL — ABNORMAL LOW (ref 12.0–15.0)
Hemoglobin: 10.8 g/dL — ABNORMAL LOW (ref 12.0–15.0)
MCH: 29.3 pg (ref 26.0–34.0)
MCH: 29.8 pg (ref 26.0–34.0)
MCHC: 32.1 g/dL (ref 30.0–36.0)
MCHC: 32 g/dL (ref 30.0–36.0)
MCV: 91.3 fL (ref 80.0–100.0)
MCV: 93.1 fL (ref 80.0–100.0)
Platelets: ADEQUATE 10*3/uL (ref 150–400)
Platelets: 171 10*3/uL (ref 150–400)
RBC: 4.03 MIL/uL (ref 3.87–5.11)
RBC: 3.62 MIL/uL — ABNORMAL LOW (ref 3.87–5.11)
RDW: 12.8 % (ref 11.5–15.5)
RDW: 12.7 % (ref 11.5–15.5)
WBC: 5.2 10*3/uL (ref 4.0–10.5)
WBC: 5.7 10*3/uL (ref 4.0–10.5)
nRBC: 0 % (ref 0.0–0.2)
nRBC: 0 % (ref 0.0–0.2)

## 2020-10-08 LAB — COMPREHENSIVE METABOLIC PANEL
ALT: 15 U/L (ref 0–44)
AST: 27 U/L (ref 15–41)
Albumin: 3.8 g/dL (ref 3.5–5.0)
Alkaline Phosphatase: 57 U/L (ref 38–126)
Anion gap: 8 (ref 5–15)
BUN: 27 mg/dL — ABNORMAL HIGH (ref 8–23)
CO2: 20 mmol/L — ABNORMAL LOW (ref 22–32)
Calcium: 9.9 mg/dL (ref 8.9–10.3)
Chloride: 111 mmol/L (ref 98–111)
Creatinine, Ser: 1.32 mg/dL — ABNORMAL HIGH (ref 0.44–1.00)
GFR, Estimated: 41 mL/min — ABNORMAL LOW (ref >60)
Glucose, Bld: 101 mg/dL — ABNORMAL HIGH (ref 70–99)
Potassium: 4.4 mmol/L (ref 3.5–5.1)
Sodium: 139 mmol/L (ref 135–145)
Total Bilirubin: 0.7 mg/dL (ref 0.3–1.2)
Total Protein: 6.5 g/dL (ref 6.5–8.1)

## 2020-10-08 LAB — APTT
aPTT: 141 seconds — ABNORMAL HIGH (ref 24–36)
aPTT: 146 seconds — ABNORMAL HIGH (ref 24–36)

## 2020-10-08 LAB — ECHOCARDIOGRAM COMPLETE
Area-P 1/2: 2.21 cm2
Height: 66 in
S' Lateral: 2.7 cm
Weight: 2716.07 oz

## 2020-10-08 LAB — TROPONIN I (HIGH SENSITIVITY)
Troponin I (High Sensitivity): 736 ng/L (ref <18)
Troponin I (High Sensitivity): 623 ng/L (ref <18)

## 2020-10-08 LAB — HEPARIN LEVEL (UNFRACTIONATED): Heparin Unfractionated: >2.2 IU/mL — ABNORMAL HIGH (ref 0.30–0.70)

## 2020-10-08 LAB — PROTIME-INR
INR: 1.4 — ABNORMAL HIGH (ref 0.8–1.2)
Prothrombin Time: 16.4 seconds — ABNORMAL HIGH (ref 11.4–15.2)

## 2020-10-08 LAB — MAGNESIUM: Magnesium: 1.9 mg/dL (ref 1.7–2.4)

## 2020-10-08 LAB — TSH: TSH: 2.341 u[IU]/mL (ref 0.350–4.500)

## 2020-10-08 LAB — MRSA PCR SCREENING: MRSA by PCR: NEGATIVE

## 2020-10-08 MED ORDER — PERFLUTREN LIPID MICROSPHERE
1.0000 mL | INTRAVENOUS | Status: AC | PRN
  Administered 2020-10-08: 3 mL via INTRAVENOUS
  Filled 2020-10-08: qty 10

## 2020-10-08 MED ORDER — LIP MEDEX EX OINT: TOPICAL_OINTMENT | CUTANEOUS | Status: DC | PRN

## 2020-10-08 MED ORDER — PANTOPRAZOLE SODIUM 40 MG PO TBEC
40.0000 mg | DELAYED_RELEASE_TABLET | Freq: Every day | ORAL | Status: DC
  Administered 2020-10-08 – 2020-10-12 (×5): 40 mg via ORAL
  Filled 2020-10-08 (×5): qty 1

## 2020-10-08 MED ORDER — ACETAMINOPHEN 500 MG PO TABS
1000.0000 mg | ORAL_TABLET | Freq: Every day | ORAL | Status: DC
  Administered 2020-10-08 – 2020-10-11 (×4): 1000 mg via ORAL
  Filled 2020-10-08 (×4): qty 2

## 2020-10-08 MED ORDER — HEPARIN (PORCINE) 25000 UT/250ML-% IV SOLN
900.0000 [IU]/h | INTRAVENOUS | Status: DC
  Administered 2020-10-08: 900 [IU]/h via INTRAVENOUS

## 2020-10-08 MED ORDER — ONDANSETRON HCL 4 MG/2ML IJ SOLN: 4.0000 mg | Freq: Four times a day (QID) | INTRAMUSCULAR | Status: DC | PRN

## 2020-10-08 MED ORDER — IRBESARTAN 150 MG PO TABS
150.0000 mg | ORAL_TABLET | Freq: Every day | ORAL | Status: DC
  Administered 2020-10-08 – 2020-10-12 (×5): 150 mg via ORAL
  Filled 2020-10-08 (×5): qty 1

## 2020-10-08 MED ORDER — LIP MEDEX EX OINT
TOPICAL_OINTMENT | CUTANEOUS | Status: AC
  Filled 2020-10-08: qty 7

## 2020-10-08 MED ORDER — METHOCARBAMOL 500 MG PO TABS: 500.0000 mg | ORAL_TABLET | Freq: Four times a day (QID) | ORAL | Status: DC | PRN

## 2020-10-08 MED ORDER — HEPARIN (PORCINE) 25000 UT/250ML-% IV SOLN
700.0000 [IU]/h | INTRAVENOUS | Status: DC
  Administered 2020-10-08 – 2020-10-09 (×2): 700 [IU]/h via INTRAVENOUS
  Filled 2020-10-08: qty 250

## 2020-10-08 MED ORDER — SPIRONOLACTONE 25 MG PO TABS
25.0000 mg | ORAL_TABLET | Freq: Every day | ORAL | Status: DC
  Administered 2020-10-08 – 2020-10-12 (×5): 25 mg via ORAL
  Filled 2020-10-08 (×6): qty 1

## 2020-10-08 MED ORDER — MAGNESIUM SULFATE 2 GM/50ML IV SOLN
2.0000 g | Freq: Once | INTRAVENOUS | Status: AC
  Administered 2020-10-08: 2 g via INTRAVENOUS
  Filled 2020-10-08: qty 50

## 2020-10-08 MED ORDER — ASPIRIN EC 81 MG PO TBEC
81.0000 mg | DELAYED_RELEASE_TABLET | Freq: Every day | ORAL | Status: DC
  Administered 2020-10-08 – 2020-10-09 (×2): 81 mg via ORAL
  Filled 2020-10-08 (×2): qty 1

## 2020-10-08 NOTE — Progress Notes (Signed)
[place] for heparin Indication: Atrial fibrillation & possible NSTEMI  Allergies  Allergen Reactions  . Lidocaine Anaphylaxis  . Morphine Other (See Comments)    "Body shuts down"  . Procaine Hcl Anaphylaxis, Rash and Other (See Comments)    "Anything with 'caine' in it "  . Sulfonamide Derivatives Hives  . Norco [Hydrocodone-Acetaminophen] Other (See Comments)    "Made me sick"  . Tramadol Nausea Only  . Vytorin [Ezetimibe-Simvastatin] Other (See Comments)    Unknown  . Tape Itching and Other (See Comments)    Patient prefers either paper tape or Coban wrap    Patient Measurements: Height: 5\' 6"  (167.6 cm) Weight: 76.8 kg (169 lb 5 oz) IBW/kg (Calculated) : 59.3 Heparin Dosing Weight: 73.5 kg  Vital Signs: Temp: 98.3 F (36.8 C) (12/05 0400) Temp Source: Oral (12/05 0400) BP: 136/49 (12/05 0500) Pulse Rate: 39 (12/05 0500)  Labs: Recent Labs    10/07/20 1152 10/07/20 1406 10/07/20 1822 10/07/20 1949 10/07/20 2112 10/08/20 0313  HGB 12.3  --   --   --   --   --   HCT 38.2  --   --   --   --   --   PLT 184  --   --   --   --   --   APTT  --   --   --  37*  --  141*  LABPROT  --   --   --   --   --  16.4*  INR  --   --   --   --   --  1.4*  HEPARINUNFRC  --   --   --  3.92*  --   --   CREATININE 1.30*  --   --   --   --   --   TROPONINIHS 79*   < > 639* 737* 763*  --    < > = values in this interval not displayed.    Estimated Creatinine Clearance: 35.5 mL/min (A) (by C-G formula based on SCr of 1.3 mg/dL (H)).   Medical History: Past Medical History:  Diagnosis Date  . A-fib ([place])   . Allergy   . CAD (coronary artery disease)    sees Dr. Shelva Majestic  cardiac stents - 2000  . CHF (congestive heart failure) ([place])   . Colon polyps   . Complication of anesthesia    rash/hives with "caines"  . Dyspnea    02/12/18 " when my heart gets out of rhythm, it has not been out of rhythym- since I have been on Tikosyn  (11/2017)  . Dysrhythmia    afib fib  . GERD (gastroesophageal reflux disease)    takes OTC- Omeprazole- prn  . Heart murmur   . History of stress test    show normal perfusion without scar or ischemia, post EF 68%  . Hx of echocardiogram    show an EF 55%-60% range with grade 1 diastolic dysfunction, she had mitral anular calcification with mild MR, moderate LA dilation and mild pulmonary hypertension with a PA estimated pressure of 45mm  . Hyperlipidemia   . Hypertension   . NSTEMI (non-ST elevated myocardial infarction) ([place])   . Osteoarthritis   . Pneumonia    hx of 2015   . PONV (postoperative nausea and vomiting)     Medications:  Medications Prior to Admission  Medication Sig Dispense Refill Last Dose  . acetaminophen (TYLENOL) 500 MG tablet Take  1,000 mg by mouth at bedtime.    10/06/2020 at pm  . amoxicillin (AMOXIL) 500 MG tablet Take 2,000 mg by mouth See admin instructions. Take 4 tablets (2000 mg) by mouth one hour prior to dental appointments   month ago  . aspirin EC 81 MG EC tablet Take 1 tablet (81 mg total) by mouth daily. Swallow whole. 30 tablet 11 10/07/2020 at am  . candesartan (ATACAND) 16 MG tablet Take 1 tablet (16 mg total) by mouth daily. 90 tablet 3 10/07/2020 at am  . Cholecalciferol (VITAMIN D3) 2000 units TABS Take 2,000 Units by mouth daily.   10/07/2020 at am  . dextromethorphan (DELSYM) 30 MG/5ML liquid Take 15 mg by mouth daily as needed for cough.    month ago  . diclofenac Sodium (VOLTAREN) 1 % GEL Apply 1 application topically 4 (four) times daily as needed (knee pain).    10/06/2020 at pm  . dofetilide (TIKOSYN) 125 MCG capsule Take 1 capsule (125 mcg total) by mouth 2 (two) times daily. 180 capsule 1 10/07/2020 at 9am  . ELIQUIS 5 MG TABS tablet TAKE 1 TABLET TWICE A DAY (Patient taking differently: Take 5 mg by mouth 2 (two) times daily. ) 180 tablet 1 10/07/2020 at 9am  . hypromellose (VISTA GEL DRY EYE RELIEF) 0.3 % GEL ophthalmic ointment Place 1  application into both eyes at bedtime as needed for dry eyes.    2-3 weeks ago  . ipratropium (ATROVENT) 0.06 % nasal spray Place 2 sprays into both nostrils daily as needed for rhinitis.   5 10/05/2020  . ketotifen (ALAWAY) 0.025 % ophthalmic solution Place 1 drop into both eyes 2 (two) times daily as needed (eye irritation/allergy).    2 weeks ago  . loratadine (CLARITIN) 10 MG tablet Take 10 mg by mouth daily as needed (allergies/sinus issues).    10/05/2020  . Multiple Vitamin (MULTIVITAMIN WITH MINERALS) TABS tablet Take 1 tablet by mouth daily.   10/07/2020 at am  . nitroGLYCERIN (NITROSTAT) 0.4 MG SL tablet Place 1 tablet (0.4 mg total) under the tongue every 5 (five) minutes x 3 doses as needed for chest pain. 25 tablet 3 10/07/2020 at 9am  . pantoprazole (PROTONIX) 40 MG tablet Take 1 tablet (40 mg total) by mouth daily. 90 tablet 2 10/07/2020 at am  . PRESCRIPTION MEDICATION See admin instructions. CPAP- At bedtime   10/06/2020 at pm  . rosuvastatin (CRESTOR) 40 MG tablet Take 1 tablet (40 mg total) by mouth daily. (Patient taking differently: Take 40 mg by mouth at bedtime. ) 90 tablet 3 10/06/2020 at pm  . spironolactone (ALDACTONE) 25 MG tablet Take 1 tablet (25 mg total) by mouth daily. 90 tablet 3 10/07/2020 at am  . triamcinolone (NASACORT) 55 MCG/ACT nasal inhaler Place 1 spray into both nostrils daily as needed (for allergies).    Past Week at Unknown time  . methocarbamol (ROBAXIN) 500 MG tablet Take 1 tablet (500 mg total) by mouth every 6 (six) hours as needed for muscle spasms. (Patient not taking: Reported on 10/07/2020) 50 tablet 0 Not Taking at Unknown time    Assessment: 79 yo F on Eliquis 5 mg po bid PTA for Afib.  Pharmacy consulted to dose heparin for atrial fibrillation and possible NSTEMI.   Eliquis 5 mg po bid PTA> last dose 12/4 at 09 am. SCr 1.3, CBC WNL.  Baseline heparin level 3.92- elevated as anticipated due to interaction with Eliquis.   10/08/2020:  aptt 141 sec-  elevated  on heparin infusion at 1050 units/hr  Heparin level >2.2  CBC: WNL  Verified rate with RN.  No bleeding noted.   Lab drawn peripherally distal to site heparin infusing but from same arm  Goal of Therapy:  Heparin level 0.3-0.7 units/ml aPTT 66-102 seconds Monitor platelets by anticoagulation protocol: Yes   Plan:  Hold heparin x1 hour then resume at decreased rate of 900 units/hr Repeat aptt 8h after rate change Daily aPTT/heparin level and daily CBC while on heparin  Netta Cedars, Pharm.D 10/08/2020 6:06 AM

## 2020-10-08 NOTE — Progress Notes (Signed)
PROGRESS NOTE    Barbara [name]  ZES:[id number] DOB: 10-Dec-1940 DOA: 10/07/2020 PCP: Laurey Morale, MD   Brief Narrative:   79 y.o. female who has CAD. Patient stated that her husband fell on the floor this morning and she had to call EMS to come pick him up.  While EMS was there she started having palpitations with a warm sensation, no presyncope and had a little bit of chest pain that was typical.  EMS brought her to the emergency department where she was found to have a rate of 150.  12/5: RVR resolved on dilt, coreg, tikosyn, but now bradycardic. In ICU for closer monitoring. Cards eval'd. Echo is pending. All A/V nodal agents held. She denies CP this AM.    Assessment & Plan:  NSTEMI CP A fib RVR      - presented with CP, found to be in a fib RVR w/ elevated trp     - she was given dilt, carvedilol and tikosyn     - she became bradycardic after converting; moved to ICU for closer monitoring     - 12/5: cards has eval'd; hold all A/V nodal blocking agents     - echo is pending; looks like trp has peaked, rpt EKG showed sinus brady     - will resume tikosyn when cleared by cards; if 2 doses missed, she'll need a reload peiors and EKG monitoring     - defer further NSTEMI w/u to cards   CKD 3b     - she is at baseline SCr     - watch nephro toxins  HTN     - irbesartan, aldactone  Metabolic acidosis     - mild, non-gap; follow for now  GERD     - PPI  DVT prophylaxis: heparin gtt Code Status: FULL Family Communication: None at bedside.   Status is: Inpatient  Remains inpatient appropriate because:Inpatient level of care appropriate due to severity of illness   Dispo: The patient is from: Home              Anticipated d/c is to: Home              Anticipated d/c date is: 2 days              Patient currently is not medically stable to d/c.  Consultants:   Cardiology  Procedures:   None  Antimicrobials:  . None   ROS:  Denies CP, dyspnea,  syncopal episodes, HA, N/V. Remainder ROS is negative for all not previously mentioned.  Subjective: "So you think I'm going to be here another night?"  Objective: Vitals:   10/08/20 1015 10/08/20 1030 10/08/20 1045 10/08/20 1100  BP:    (!) 150/37  Pulse: (!) 50 (!) 44 (!) 48 (!) 49  Resp: 14 19 (!) 21 13  Temp:      TempSrc:      SpO2: 100% 100% 100% 100%  Weight:      Height:        Intake/Output Summary (Last 24 hours) at 10/08/2020 1242 Last data filed at 10/08/2020 1057 Gross per 24 hour  Intake 175.33 ml  Output 600 ml  Net -424.67 ml   Filed Weights   10/07/20 1738 10/07/20 2348 10/08/20 0500  Weight: 73.5 kg 76.8 kg 77 kg    Examination:  General: 79 y.o. female resting in bed in NAD Eyes: PERRL, normal sclera ENMT: Nares patent w/o discharge, orophaynx clear, dentition normal,  ears w/o discharge/lesions/ulcers Neck: Supple, trachea midline Cardiovascular: brady, +S1, S2, no m/g/r, equal pulses throughout Respiratory: CTABL, no w/r/r, normal WOB GI: BS+, NDNT, no masses noted, no organomegaly noted MSK: No e/c/c Skin: No rashes, bruises, ulcerations noted Neuro: A&O x 3, no focal deficits Psyc: Appropriate interaction and affect, calm/cooperative   Data Reviewed: I have personally reviewed following labs and imaging studies.  CBC: Recent Labs  Lab 10/07/20 1152 10/08/20 0920  WBC 6.0 5.2  HGB 12.3 11.8*  HCT 38.2 36.8  MCV 91.6 91.3  PLT 184 PLATELET CLUMPS NOTED ON SMEAR, COUNT APPEARS ADEQUATE   Basic Metabolic Panel: Recent Labs  Lab 10/07/20 1152 10/08/20 0313 10/08/20 0920  NA 138  --  139  K 4.7  --  4.4  CL 107  --  111  CO2 21*  --  20*  GLUCOSE 113*  --  101*  BUN 31*  --  27*  CREATININE 1.30*  --  1.32*  CALCIUM 10.3  --  9.9  MG  --  1.9  --    GFR: Estimated Creatinine Clearance: 35 mL/min (A) (by C-G formula based on SCr of 1.32 mg/dL (H)). Liver Function Tests: Recent Labs  Lab 10/08/20 0920  AST 27  ALT 15    ALKPHOS 57  BILITOT 0.7  PROT 6.5  ALBUMIN 3.8   No results for input(s): LIPASE, AMYLASE in the last 168 hours. No results for input(s): AMMONIA in the last 168 hours. Coagulation Profile: Recent Labs  Lab 10/08/20 0313  INR 1.4*   Cardiac Enzymes: No results for input(s): CKTOTAL, CKMB, CKMBINDEX, TROPONINI in the last 168 hours. BNP (last 3 results) No results for input(s): PROBNP in the last 8760 hours. HbA1C: Recent Labs    10/08/20 0313  HGBA1C 5.7*   CBG: No results for input(s): GLUCAP in the last 168 hours. Lipid Profile: Recent Labs    10/08/20 0313  CHOL 111  HDL 49  LDLCALC 50  TRIG 62  CHOLHDL 2.3   Thyroid Function Tests: Recent Labs    10/08/20 0313  TSH 2.341   Anemia Panel: No results for input(s): VITAMINB12, FOLATE, FERRITIN, TIBC, IRON, RETICCTPCT in the last 72 hours. Sepsis Labs: No results for input(s): PROCALCITON, LATICACIDVEN in the last 168 hours.  Recent Results (from the past 240 hour(s))  Resp Panel by RT-PCR (Flu A&B, Covid) Nasopharyngeal Swab     Status: None   Collection Time: 10/07/20  2:06 PM   Specimen: Nasopharyngeal Swab; Nasopharyngeal(NP) swabs in vial transport medium  Result Value Ref Range Status   SARS Coronavirus 2 by RT PCR NEGATIVE NEGATIVE Final    Comment: (NOTE) SARS-CoV-2 target nucleic acids are NOT DETECTED.  The SARS-CoV-2 RNA is generally detectable in upper respiratory specimens during the acute phase of infection. The lowest concentration of SARS-CoV-2 viral copies this assay can detect is 138 copies/mL. A negative result does not preclude SARS-Cov-2 infection and should not be used as the sole basis for treatment or other patient management decisions. A negative result may occur with  improper specimen collection/handling, submission of specimen other than nasopharyngeal swab, presence of viral mutation(s) within the areas targeted by this assay, and inadequate number of viral copies(<138  copies/mL). A negative result must be combined with clinical observations, patient history, and epidemiological information. The expected result is Negative.  Fact Sheet for Patients:  EntrepreneurPulse.com.au  Fact Sheet for Healthcare Providers:  IncredibleEmployment.be  This test is no t yet approved or cleared by the  [place] [employer] and  has been authorized for detection and/or diagnosis of SARS-CoV-2 by FDA under an Print production planner (EUA). This EUA will remain  in effect (meaning this test can be used) for the duration of the COVID-19 declaration under Section 564(b)(1) of the Act, 21 U.S.C.section 360bbb-3(b)(1), unless the authorization is terminated  or revoked sooner.       Influenza A by PCR NEGATIVE NEGATIVE Final   Influenza B by PCR NEGATIVE NEGATIVE Final    Comment: (NOTE) The Xpert Xpress SARS-CoV-2/FLU/RSV plus assay is intended as an aid in the diagnosis of influenza from Nasopharyngeal swab specimens and should not be used as a sole basis for treatment. Nasal washings and aspirates are unacceptable for Xpert Xpress SARS-CoV-2/FLU/RSV testing.  Fact Sheet for Patients: EntrepreneurPulse.com.au  Fact Sheet for Healthcare Providers: IncredibleEmployment.be  This test is not yet approved or cleared by the [place] FDA and has been authorized for detection and/or diagnosis of SARS-CoV-2 by FDA under an Emergency Use Authorization (EUA). This EUA will remain in effect (meaning this test can be used) for the duration of the COVID-19 declaration under Section 564(b)(1) of the Act, 21 U.S.C. section 360bbb-3(b)(1), unless the authorization is terminated or revoked.  Performed at [facility], [place] [address], [place], [place] [address]   MRSA PCR Screening     Status: None   Collection Time: 10/07/20 11:42 PM   Specimen: Nasal Mucosa; Nasopharyngeal    Result Value Ref Range Status   MRSA by PCR NEGATIVE NEGATIVE Final    Comment:        The GeneXpert MRSA Assay (FDA approved for NASAL specimens only), is one component of a comprehensive MRSA colonization surveillance program. It is not intended to diagnose MRSA infection nor to guide or monitor treatment for MRSA infections. Performed at [facility], [place] [address], [place], [place] [address]       Radiology Studies: [facility] Chest Port 1 View  Result Date: 10/07/2020 CLINICAL DATA:  Chest pain beginning this morning. EXAM: PORTABLE CHEST 1 VIEW COMPARISON:  05/17/2020 FINDINGS: Cardiac silhouette is normal in size. No mediastinal or hilar masses or evidence of adenopathy. Mild linear opacity at the left lung base consistent with scarring or atelectasis, stable. Lungs otherwise clear. No convincing pleural effusion.  No pneumothorax. 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 No acute skeletal abnormality. IMPRESSION: No active disease. Electronically Signed   By: Lajean Manes M.D.   On: 10/07/2020 12:22     Scheduled Meds: . acetaminophen  1,000 mg Oral QHS  . aspirin EC  81 mg Oral Daily  . Chlorhexidine Gluconate Cloth  6 each Topical Daily  . Chlorhexidine Gluconate Cloth  6 each Topical Q0600  . irbesartan  150 mg Oral Daily  . mouth rinse  15 mL Mouth Rinse BID  . pantoprazole  40 mg Oral Daily  . rosuvastatin  40 mg Oral QHS  . spironolactone  25 mg Oral Daily   Continuous Infusions: . heparin 900 Units/hr (10/08/20 1057)     LOS: 0 days    Time spent: 40 minutes spent in the coordination of care today.    Jonnie Finner, DO [facility]  If 7PM-7AM, please contact night-coverage [email or web address] 10/08/2020, 12:42 PM

## 2020-10-08 NOTE — Progress Notes (Signed)
2200 telemetry notified this nurse of bradycardia 33. Upon entering room pt bp 90s/50s, pt complaint of blurred vision and feeling flushed. 2200 dilt drip at 5 turned off. Heparin running. Cardiologist/RRT notified and pt transferred to ICU. Report given to [facility] ICU RN.

## 2020-10-08 NOTE — Consult Note (Addendum)
Cardiology Consultation:   Patient ID: Barbara [name] MRN: [id number]; DOB: 1941/03/06  Admit date: 10/07/2020 Date of Consult: 10/08/2020  Primary Care Provider: Laurey Morale, MD [facility] Cardiologist: Shelva Majestic, MD  [facility] Electrophysiologist:  Dr. Rayann Heman   Patient Profile:   Barbara [name] is a 78 y.o. female with a hx of CAD with prior PCI 2000, apical hypertrophic cardiomyopathy, hypertension, hyperlipidemia, atrial flutter/atrial fibrillation on dofetilide, OSA on CPAP who is being seen today for the evaluation of atrial fibrillation with RVR at the request of Dr. Marylyn Ishihara.  History of Present Illness:   Barbara [name] has history as noted above. She presented to the ER 10/07/20 with chest pain and palpitations. This was typical for her atrial fibrillation symptoms. She came to the ER and was found to have a heart rate of 150 bpm.   Her hsTni have trended 79, 155, 639, 737, 763. Initial ER ECG reviewed, atrial fibrillation at 158 bpm (later in ECG rate slows, no flutter waves). Following ECGs are rate controlled atrial fibrillation. ECG from 10/07/20 22:57 is concerning for junctional bradycardia, which is how it appears on the ECG, but telemetry shows variation more consistent with atrial fibrillation with slow ventricular response.  She tells me that she thought she strained herself trying to lift her husband when he fell out of bed around 4:30 AM. EMS came and helped him back into bed, but she developed a rapid heart beat and a sensation of someone drumming on her chest. She called the emergency nurse line of her PCP's office, who recommended that she come to the ER. She took 4 nitroglycerin prior to coming to the ER, and by the time she arrived her her chest discomfort had resolved.  She does note that just before Thanksgiving, she missed one morning of medications, including tikosyn and apixaban. Otherwise has not missed any medications.  She received  the following medications since her arrival to ER:  IV diltazem 10 mg at 1200 12/4 with start of IV diltiazem 5 mg/hr. The IV drip was intermittently continued and paused until it was discontinued at 2200 12/4 Oral 6.35 mg carvedilol at 1909 12/4 Oral 125 mcg tikosyn at 2123 12/4  I agree with the note written by Dr. Renella Cunas, covering cardiologist, at 2332 last evening. AV nodal agents stopped, and she was transferred to ICU for closer monitoring.  I reviewed her telemetry. She has remained in atrial fibrillation, but her heart rate continued to slow. On telemetry she has periods where the rate is largely regular, consistent with possible junctional bradycardia, but at other periods it appears slow but irregular, more consistent with atrial fibrillation with slow ventricular response.  She is feeling much better. We discussed both the elevated troponins and her heart rate. She asked that I speak to her Barbara [name] on the phone, which I did in her presence.  Denies current chest pain, shortness of breath at rest or with normal exertion. No PND, orthopnea, LE edema or unexpected weight gain. No syncope or current palpitations.   Past Medical History:  Diagnosis Date  . A-fib ([place])   . Allergy   . CAD (coronary artery disease)    sees Dr. Shelva Majestic  cardiac stents - 2000  . CHF (congestive heart failure) ([place])   . Colon polyps   . Complication of anesthesia    rash/hives with "caines"  . Dyspnea    02/12/18 " when my heart gets out of rhythm, it has not been  out of rhythym- since I have been on Tikosyn (11/2017)  . Dysrhythmia    afib fib  . GERD (gastroesophageal reflux disease)    takes OTC- Omeprazole- prn  . Heart murmur   . History of stress test    show normal perfusion without scar or ischemia, post EF 68%  . Hx of echocardiogram    show an EF 55%-60% range with grade 1 diastolic dysfunction, she had mitral anular calcification with mild MR, moderate LA dilation and mild  pulmonary hypertension with a PA estimated pressure of 22mm  . Hyperlipidemia   . Hypertension   . NSTEMI (non-ST elevated myocardial infarction) ([place])   . Osteoarthritis   . Pneumonia    hx of 2015   . PONV (postoperative nausea and vomiting)     Past Surgical History:  Procedure Laterality Date  . CARDIAC CATHETERIZATION     11/2017  . cardiac stents  2000  . COLONOSCOPY  01-05-14   per Dr. Teena Irani, clear, no repeats needed   . COLONOSCOPY    . CORONARY STENT PLACEMENT  2000   in LAD  . DIRECT LARYNGOSCOPY WITH RADIAESSE INJECTION N/A 02/13/2018   Procedure: DIRECT LARYNGOSCOPY WITH RADIAESSE INJECTION;  Surgeon: Melida Quitter, MD;  Location: [place];  Service: ENT;  Laterality: N/A;  . EYE SURGERY Left    CATARACT REMOVAL  . KNEE ARTHROSCOPY Left 01/06/2015   Procedure: LEFT KNEE ARTHROSCOPY, abrasion chondroplasty of the medial femerol condryl,medial and lateral menisectomy, microfracture , synovectomy of the suprpatellar pouch;  Surgeon: Latanya Maudlin, MD;  Location: [facility];  Service: Orthopedics;  Laterality: Left;  . LEFT HEART CATH AND CORONARY ANGIOGRAPHY N/A 02/26/2019   Procedure: LEFT HEART CATH AND CORONARY ANGIOGRAPHY;  Surgeon: Troy Sine, MD;  Location: [place] [facility];  Service: Cardiovascular;  Laterality: N/A;  . MICROLARYNGOSCOPY W/VOCAL CORD INJECTION N/A 08/07/2018   Procedure: MICROLARYNGOSCOPY WITH VOCAL CORD INJECTION OF PROLARYN;  Surgeon: Melida Quitter, MD;  Location: [place];  Service: ENT;  Laterality: N/A;  JET VENTILATION  . REVERSE SHOULDER ARTHROPLASTY Right 03/23/2020   Procedure: REVERSE SHOULDER ARTHROPLASTY;  Surgeon: Nicholes Stairs, MD;  Location: [place];  Service: Orthopedics;  Laterality: Right;  Marland Kitchen VAGINAL HYSTERECTOMY  1971     Home Medications:  Prior to Admission medications   Medication Sig Start Date End Date Taking? Authorizing Provider  acetaminophen (TYLENOL) 500 MG tablet Take 1,000 mg by mouth at bedtime.    Yes [provider]  amoxicillin (AMOXIL) 500 MG tablet Take 2,000 mg by mouth See admin instructions. Take 4 tablets (2000 mg) by mouth one hour prior to dental appointments 09/01/20  Yes [provider]  aspirin EC 81 MG EC tablet Take 1 tablet (81 mg total) by mouth daily. Swallow whole. 05/20/20  Yes Kroeger, Daleen Snook M., PA-C  candesartan (ATACAND) 16 MG tablet Take 1 tablet (16 mg total) by mouth daily. 02/25/20  Yes Troy Sine, MD  Cholecalciferol (VITAMIN D3) 2000 units TABS Take 2,000 Units by mouth daily.   Yes [provider]  dextromethorphan (DELSYM) 30 MG/5ML liquid Take 15 mg by mouth daily as needed for cough.    Yes [provider]  diclofenac Sodium (VOLTAREN) 1 % GEL Apply 1 application topically 4 (four) times daily as needed (knee pain).  10/04/20  Yes [provider]  dofetilide (TIKOSYN) 125 MCG capsule Take 1 capsule (125 mcg total) by mouth 2 (two) times daily. 06/05/20  Yes Shelva Majestic  A, MD  ELIQUIS 5 MG TABS tablet TAKE 1 TABLET TWICE A DAY Patient taking differently: Take 5 mg by mouth 2 (two) times daily.  06/26/20  Yes Troy Sine, MD  hypromellose (VISTA GEL DRY EYE RELIEF) 0.3 % GEL ophthalmic ointment Place 1 application into both eyes at bedtime as needed for dry eyes.    Yes [provider]  ipratropium (ATROVENT) 0.06 % nasal spray Place 2 sprays into both nostrils daily as needed for rhinitis.  04/28/18  Yes [provider]  ketotifen (ALAWAY) 0.025 % ophthalmic solution Place 1 drop into both eyes 2 (two) times daily as needed (eye irritation/allergy).    Yes [provider]  loratadine (CLARITIN) 10 MG tablet Take 10 mg by mouth daily as needed (allergies/sinus issues).    Yes [provider]  Multiple Vitamin (MULTIVITAMIN WITH MINERALS) TABS tablet Take 1 tablet by mouth daily.   Yes [provider]  nitroGLYCERIN (NITROSTAT) 0.4 MG SL tablet Place 1 tablet (0.4 mg total) under the  tongue every 5 (five) minutes x 3 doses as needed for chest pain. 05/19/20  Yes Kroeger, Daleen Snook M., PA-C  pantoprazole (PROTONIX) 40 MG tablet Take 1 tablet (40 mg total) by mouth daily. 08/23/20  Yes Troy Sine, MD  PRESCRIPTION MEDICATION See admin instructions. CPAP- At bedtime   Yes [provider]  rosuvastatin (CRESTOR) 40 MG tablet Take 1 tablet (40 mg total) by mouth daily. Patient taking differently: Take 40 mg by mouth at bedtime.  02/25/20  Yes Troy Sine, MD  spironolactone (ALDACTONE) 25 MG tablet Take 1 tablet (25 mg total) by mouth daily. 02/25/20  Yes Troy Sine, MD  triamcinolone (NASACORT) 55 MCG/ACT nasal inhaler Place 1 spray into both nostrils daily as needed (for allergies).  10/17/12  Yes Laurey Morale, MD  methocarbamol (ROBAXIN) 500 MG tablet Take 1 tablet (500 mg total) by mouth every 6 (six) hours as needed for muscle spasms. Patient not taking: Reported on 10/07/2020 03/24/20   Nicholes Stairs, MD    Inpatient Medications: Scheduled Meds: . acetaminophen  1,000 mg Oral QHS  . aspirin EC  81 mg Oral Daily  . Chlorhexidine Gluconate Cloth  6 each Topical Daily  . Chlorhexidine Gluconate Cloth  6 each Topical Q0600  . irbesartan  150 mg Oral Daily  . mouth rinse  15 mL Mouth Rinse BID  . pantoprazole  40 mg Oral Daily  . rosuvastatin  40 mg Oral QHS  . spironolactone  25 mg Oral Daily   Continuous Infusions: . heparin 900 Units/hr (10/08/20 0900)   PRN Meds: methocarbamol  Allergies:    Allergies  Allergen Reactions  . Lidocaine Anaphylaxis  . Morphine Other (See Comments)    "Body shuts down"  . Procaine Hcl Anaphylaxis, Rash and Other (See Comments)    "Anything with 'caine' in it "  . Sulfonamide Derivatives Hives  . Norco [Hydrocodone-Acetaminophen] Other (See Comments)    "Made me sick"  . Tramadol Nausea Only  . Vytorin [Ezetimibe-Simvastatin] Other (See Comments)    Unknown  . Tape Itching and Other (See Comments)     Patient prefers either paper tape or Coban wrap    Social History:   Social History   Socioeconomic History  . Marital status: Married    Spouse name: Not on file  . Number of children: 2  . Years of education: Not on file  . Highest education level: Not on file  Occupational  History  . Not on file  Tobacco Use  . Smoking status: Former Smoker    Years: 40.00    Quit date: 11/04/1996    Years since quitting: 23.9  . Smokeless tobacco: Never Used  Vaping Use  . Vaping Use: Never used  Substance and Sexual Activity  . Alcohol use: No    Alcohol/week: 0.0 standard drinks  . Drug use: No  . Sexual activity: Not on file  Other Topics Concern  . Not on file  Social History Narrative  . Not on file   Social Determinants of Health   Financial Resource Strain:   . Difficulty of Paying Living Expenses: Not on file  Food Insecurity:   . Worried About Charity fundraiser in the Last Year: Not on file  . Ran Out of Food in the Last Year: Not on file  Transportation Needs:   . Lack of Transportation (Medical): Not on file  . Lack of Transportation (Non-Medical): Not on file  Physical Activity:   . Days of Exercise per Week: Not on file  . Minutes of Exercise per Session: Not on file  Stress:   . Feeling of Stress : Not on file  Social Connections:   . Frequency of Communication with Friends and Family: Not on file  . Frequency of Social Gatherings with Friends and Family: Not on file  . Attends Religious Services: Not on file  . Active Member of Clubs or Organizations: Not on file  . Attends Archivist Meetings: Not on file  . Marital Status: Not on file  Intimate Partner Violence:   . Fear of Current or Ex-Partner: Not on file  . Emotionally Abused: Not on file  . Physically Abused: Not on file  . Sexually Abused: Not on file    Family History:   Family History  Problem Relation Age of Onset  . Cancer Maternal Grandmother   . Heart disease Maternal  Grandfather      ROS:  Please see the history of present illness.  Constitutional: Negative for chills, fever, night sweats, unintentional weight loss  HENT: Negative for ear pain and hearing loss.   Eyes: Negative for loss of vision and eye pain.  Respiratory: Negative for cough, sputum, wheezing.   Cardiovascular: See HPI. Gastrointestinal: Negative for abdominal pain, melena, and hematochezia.  Genitourinary: Negative for dysuria and hematuria.  Musculoskeletal: Negative for falls and myalgias.  Skin: Negative for itching and rash.  Neurological: Negative for focal weakness, focal sensory changes and loss of consciousness.  Endo/Heme/Allergies: Does bruise/bleed easily.  All other ROS reviewed and negative.     Physical Exam/Data:   Vitals:   10/08/20 0815 10/08/20 0830 10/08/20 0845 10/08/20 0900  BP:    (!) 162/52  Pulse: (!) 45 (!) 47 (!) 47 (!) 47  Resp: 13 14 (!) 21 13  Temp:    98.4 F (36.9 C)  TempSrc:    Oral  SpO2: 99% 100% 99% 100%  Weight:      Height:        Intake/Output Summary (Last 24 hours) at 10/08/2020 1012 Last data filed at 10/08/2020 0900 Gross per 24 hour  Intake 158.78 ml  Output 600 ml  Net -441.22 ml   Last 3 Weights 10/08/2020 10/07/2020 10/07/2020  Weight (lbs) 169 lb 12.1 oz 169 lb 5 oz 162 lb  Weight (kg) 77 kg 76.8 kg 73.483 kg     Body mass index is 27.4 kg/m.  General:  Well  nourished, well developed, in no acute distress HEENT: normal Neck: no JVD appreciated Endocrine:  No thryomegaly Vascular: No carotid bruits; RA pulses 2+ bilaterally Cardiac:  Bradycardic S1, S2, largely regular with occasional irregular beats; 2/6 systolic murmur Lungs:  clear to auscultation bilaterally, no wheezing, rhonchi or rales  Abd: soft, nontender, no hepatomegaly  Ext: no edema Musculoskeletal:  No deformities, BUE and BLE strength normal and equal Skin: warm and dry  Neuro:  CNs 2-12 intact, no focal abnormalities noted Psych:  Normal affect    EKG:  The EKG was personally reviewed and demonstrates:  As noted above, multiple ECGs starting with atrial fib RVR and then transitioning to atrial fib slow ventricular rate/junctional bradycardia Telemetry:  Telemetry was personally reviewed and demonstrates:  afib RVR, then afib rate controlled, then afib slow ventricular rate/junctional bradycardia  Relevant CV Studies: NST 05/19/20: IMPRESSION: 1. No reversible ischemia or infarction. 2. Septal hypokinesia. 3. Left ventricular ejection fraction 62% 4. Non invasive risk stratification*: Low  Echocardiogram 05/18/20: 1. Left ventricular ejection fraction, by estimation, is 70 to 75%. The  left ventricle has hyperdynamic function. The left ventricle has no  regional wall motion abnormalities. There is mild left ventricular  hypertrophy. Left ventricular diastolic  parameters are consistent with Grade II diastolic dysfunction  (pseudonormalization). Elevated left atrial pressure.  2. Right ventricular systolic function is normal. The right ventricular  size is normal. There is mildly elevated pulmonary artery systolic  pressure. The estimated right ventricular systolic pressure is 18.8 mmHg.  3. Left atrial size was severely dilated.  4. The mitral valve is normal in structure. Mild mitral valve  regurgitation.  5. The aortic valve is tricuspid. Aortic valve regurgitation is not  visualized. Mild aortic valve sclerosis is present, with no evidence of  aortic valve stenosis.  6. Apical hypertrophy with obliteration of LV cavity in systole at apex,  concerning for apical HCM, would consider cardiac MRI for further  evaluation   Cardiac MRI 05/19/20: IMPRESSION: 1. LV apical hypertrophy measuring up to 51mm (25mm in basal posterior wall), consistent with apical hypertrophic cardiomyopathy 2. Patchy late gadolinium enhancement at apex and RV insertion site, consistent with apical HCM. LGE accounts for 2% of total myocardial  mass 3. Normal LV size with hyperdynamic systolic function (EF 41%) 4. Normal RV size with hyperdynamic systolic function (EF 66%) Electronically Signed By: Oswaldo Milian MD On: 05/19/2020 16:38   Laboratory Data:  High Sensitivity Troponin:   Recent Labs  Lab 10/07/20 1152 10/07/20 1406 10/07/20 1822 10/07/20 1949 10/07/20 2112  TROPONINIHS 79* 155* 639* 737* 763*     Chemistry Recent Labs  Lab 10/07/20 1152  NA 138  K 4.7  CL 107  CO2 21*  GLUCOSE 113*  BUN 31*  CREATININE 1.30*  CALCIUM 10.3  GFRNONAA 41*  ANIONGAP 10    No results for input(s): PROT, ALBUMIN, AST, ALT, ALKPHOS, BILITOT in the last 168 hours. Hematology Recent Labs  Lab 10/07/20 1152  WBC 6.0  RBC 4.17  HGB 12.3  HCT 38.2  MCV 91.6  MCH 29.5  MCHC 32.2  RDW 12.6  PLT 184   BNPNo results for input(s): BNP, PROBNP in the last 168 hours.  DDimer No results for input(s): DDIMER in the last 168 hours.   Radiology/Studies:  DG Chest Port 1 View  Result Date: 10/07/2020 CLINICAL DATA:  Chest pain beginning this morning. EXAM: PORTABLE CHEST 1 VIEW COMPARISON:  05/17/2020 FINDINGS: Cardiac silhouette is normal in size. No mediastinal  or hilar masses or evidence of adenopathy. Mild linear opacity at the left lung base consistent with scarring or atelectasis, stable. Lungs otherwise clear. No convincing pleural effusion.  No pneumothorax. 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 No acute skeletal abnormality. IMPRESSION: No active disease. Electronically Signed   By: Lajean Manes M.D.   On: 10/07/2020 12:22     Assessment and Plan:   Atrial fibrillation, presenting with rapid ventricular rate and also with intermittent slow ventricular rate/junctional bradycardia -she received carvedilol and diltiazem yesterday in addition to tikosyn. Diltiazem can increase the blood levels of dofetilide, so this combination is not recommended -avoid all AV nodal agents until heart  rate improves. -if she misses more than 2 consecutive doses of tikosyn, she will have to be re-loaded with tikosyn in the hospital with ECGs two hours post doses.  -there are notes in the chart re: prior issues with bradycardia. If her bradycardia persists after washout, tikosyn may not be an ideal choice for her -Keep K>4, Mg >2 -on heparin for anticoagulation, holding home apixaban pending evaluation of her chest pain/troponins CHA2DS2-VASc Score = 6  This indicates a 9.7% annual risk of stroke. The patient's score is based upon: CHF History: 1 HTN History: 1 Diabetes History: 0 Stroke History: 0 Vascular Disease History: 1 Age Score: 2 Gender Score: 1  Elevated troponins Chest discomfort History of CAD with prior stent 2002 Chronic kidney disease, stage 3a -Troponins still uptrending, continue to peak -on aspirin, heparin, rosuvastatin -similar to pattern seen during admission 05/2020 -echo pending -she is chest pain free -last cath 02/26/19 with mild non-obstructive disease -last stress test 05/19/20 without ischemia -if her echo is without wall motion abnormalities and her troponins peak, may be reasonable to do outpatient stress test (improved images) vs inpatient cath if either echo abnormal or chest pain recurs  Hypertension: -continue ARB and spironolactone  Apical variant hypertrophic cardiomyopathy: appears euvolemic  TIMI Risk Score for Unstable Angina or Non-ST Elevation MI:   The patient's TIMI risk score is 5, which indicates a 26% risk of all cause mortality, new or recurrent myocardial infarction or need for urgent revascularization in the next 14 days.  Total time of encounter: 110 minutes total time of encounter, including 60 minutes spent in face-to-face patient care. This time includes coordination of care and counseling regarding her rate and elevated troponins, as well as speakerphone call with her son in her presence. Remainder of non-face-to-face time  involved reviewing chart documents/testing relevant to the patient encounter and documentation in the medical record, as well as communication with her care team.  Buford Dresser, MD, PhD [facility]  [facility]   For questions or updates, please contact [place] Please consult [email or web address] for contact info under    Signed, Buford Dresser, MD  10/08/2020 10:12 AM

## 2020-10-08 NOTE — Progress Notes (Signed)
  Echocardiogram 2D Echocardiogram has been performed.  Barbara [name] 10/08/2020, 10:30 AM

## 2020-10-08 NOTE — Significant Event (Addendum)
Rapid Response Event Note   Reason for Call :  Bradycardia  Initial Focused Assessment:  Patient alert and oriented, denies pain, dyspnea, or lethargy. Heart rate 20-40 on monitor; EKG showed junctional rhythm. Patient had been on Tikosyn along with Cardizem drip for a-fib, and beta blockers. Bedside RN stopped Cardizem drip at 2200 when first noticed that her heart rate was decreasing.      Interventions:  RN assessment and EKG completed. RR RN contacted Hospitalist provider and Cardiologist on call. Per Cardiologist, Tikosyn, Cardizem, and beta blockers discontinued. Heparin drip to continue. Cardiologist advised that there were no reversal agents for Tikosyn. Transfer to ICU coordinated to allow for close monitoring of heart rate. Left messages per patient request to her family to let them know about transfer.   Plan of Care:  Patient transferred to ICU room 1230.   Event Summary:    MD Notified:  Call Time: [place] Time: 2300 End Time: [place], RN

## 2020-10-08 NOTE — Progress Notes (Signed)
CRITICAL VALUE ALERT  Critical Value:  Troponin 623  Date & Time Notied:  10/08/2020 1305  Provider Notified: Marylyn Ishihara, DO  Orders Received/Actions taken: No new orders at this time

## 2020-10-08 NOTE — Progress Notes (Signed)
CRITICAL VALUE ALERT  Critical Value: Troponin 736  Date & Time Notied:  10/08/2020 1119  Provider Notified: Marylyn Ishihara, DO  Orders Received/Actions taken: No new orders at this time

## 2020-10-08 NOTE — Progress Notes (Signed)
[place] for heparin Indication: Atrial fibrillation & possible NSTEMI  Allergies  Allergen Reactions  . Lidocaine Anaphylaxis  . Morphine Other (See Comments)    "Body shuts down"  . Procaine Hcl Anaphylaxis, Rash and Other (See Comments)    "Anything with 'caine' in it "  . Sulfonamide Derivatives Hives  . Norco [Hydrocodone-Acetaminophen] Other (See Comments)    "Made me sick"  . Tramadol Nausea Only  . Vytorin [Ezetimibe-Simvastatin] Other (See Comments)    Unknown  . Tape Itching and Other (See Comments)    Patient prefers either paper tape or Coban wrap    Patient Measurements: Height: 5\' 6"  (167.6 cm) Weight: 77 kg (169 lb 12.1 oz) IBW/kg (Calculated) : 59.3 Heparin Dosing Weight: 73.5 kg  Vital Signs: Temp: 98.4 F (36.9 C) (12/05 0900) Temp Source: Oral (12/05 0900) BP: 142/44 (12/05 1300) Pulse Rate: 47 (12/05 1500)  Labs: Recent Labs    10/07/20 1152 10/07/20 1152 10/07/20 1406 10/07/20 1949 10/07/20 1949 10/07/20 2112 10/08/20 0313 10/08/20 0920 10/08/20 1148 10/08/20 1636  HGB 12.3   < >  --   --   --   --   --  11.8*  --  10.8*  HCT 38.2  --   --   --   --   --   --  36.8  --  33.7*  PLT 184  --   --   --   --   --   --  PLATELET CLUMPS NOTED ON SMEAR, COUNT APPEARS ADEQUATE  --  171  APTT  --   --   --  37*  --   --  141*  --   --  146*  LABPROT  --   --   --   --   --   --  16.4*  --   --   --   INR  --   --   --   --   --   --  1.4*  --   --   --   HEPARINUNFRC  --   --   --  3.92*  --   --  >2.20*  --   --   --   CREATININE 1.30*  --   --   --   --   --   --  1.32*  --   --   TROPONINIHS 79*  --    < > 737*   < > 763*  --  736* 623*  --    < > = values in this interval not displayed.    Assessment: 79 yo F on Eliquis 5 mg po bid PTA for Afib.  Pharmacy consulted to dose heparin for atrial fibrillation and possible NSTEMI.   Eliquis 5 mg po bid PTA> last dose 12/4 at 09 am. SCr 1.3, CBC WNL.   Baseline heparin level 3.92- elevated as anticipated due to interaction with Eliquis.   10/08/2020:  1636 aptt 146 sec- remains elevated after heparin held x 2 hours (ordered to hold x 1 hr) and rate decreased from 1050 to 900 units/hr   CBC: Hg 10.8. down trending; PLT WNL  SCr 1.32, CrCl 35 ml/min  Verified rate with RN.  No bleeding noted.   Lab drawn from opposite arm from heparin drip- RN, Judson Roch,  confirmed with patient  Goal of Therapy:  Heparin level 0.3-0.7 units/ml aPTT 66-102 seconds Monitor platelets by anticoagulation protocol: Yes   Plan:  Hold  heparin x1 hour then resume at decreased rate of 700 units/hr Repeat aptt 8h after rate change Daily aPTT/heparin level and daily CBC while on heparin   Eudelia Bunch, Pharm.D 10/08/2020 5:32 PM

## 2020-10-08 NOTE — Plan of Care (Signed)
Patient came to ICU overnight after rapid response for asymptomatic bradycardia. Patient continues to deny adverse symptoms and remains alert and oriented. Discussed care with physicians.   Problem: Education: Goal: Knowledge of General Education information will improve Description: Including pain rating scale, medication(s)/side effects and non-pharmacologic comfort measures Outcome: Progressing   Problem: Health Behavior/Discharge Planning: Goal: Ability to manage health-related needs will improve Outcome: Progressing   Problem: Clinical Measurements: Goal: Ability to maintain clinical measurements within normal limits will improve Outcome: Progressing Goal: Will remain free from infection Outcome: Progressing Goal: Diagnostic test results will improve Outcome: Progressing Goal: Respiratory complications will improve Outcome: Progressing Goal: Cardiovascular complication will be avoided Outcome: Progressing   Problem: Activity: Goal: Risk for activity intolerance will decrease Outcome: Progressing   Problem: Nutrition: Goal: Adequate nutrition will be maintained Outcome: Progressing   Problem: Coping: Goal: Level of anxiety will decrease Outcome: Progressing   Problem: Elimination: Goal: Will not experience complications related to bowel motility Outcome: Progressing Goal: Will not experience complications related to urinary retention Outcome: Progressing   Problem: Pain Managment: Goal: General experience of comfort will improve Outcome: Progressing   Problem: Safety: Goal: Ability to remain free from injury will improve Outcome: Progressing   Problem: Skin Integrity: Goal: Risk for impaired skin integrity will decrease Outcome: Progressing   Problem: Education: Goal: Knowledge of disease or condition will improve Outcome: Progressing Goal: Understanding of medication regimen will improve Outcome: Progressing Goal: Individualized Educational  Video(s) Outcome: Progressing   Problem: Activity: Goal: Ability to tolerate increased activity will improve Outcome: Progressing   Problem: Cardiac: Goal: Ability to achieve and maintain adequate cardiopulmonary perfusion will improve Outcome: Progressing   Problem: Health Behavior/Discharge Planning: Goal: Ability to safely manage health-related needs after discharge will improve Outcome: Progressing

## 2020-10-09 DIAGNOSIS — E872 Acidosis, unspecified: Secondary | ICD-10-CM

## 2020-10-09 LAB — COMPREHENSIVE METABOLIC PANEL
ALT: 16 U/L (ref 0–44)
AST: 22 U/L (ref 15–41)
Albumin: 3.5 g/dL (ref 3.5–5.0)
Alkaline Phosphatase: 66 U/L (ref 38–126)
Anion gap: 7 (ref 5–15)
BUN: 30 mg/dL — ABNORMAL HIGH (ref 8–23)
CO2: 23 mmol/L (ref 22–32)
Calcium: 9.7 mg/dL (ref 8.9–10.3)
Chloride: 108 mmol/L (ref 98–111)
Creatinine, Ser: 1.52 mg/dL — ABNORMAL HIGH (ref 0.44–1.00)
GFR, Estimated: 34 mL/min — ABNORMAL LOW (ref >60)
Glucose, Bld: 135 mg/dL — ABNORMAL HIGH (ref 70–99)
Potassium: 4.1 mmol/L (ref 3.5–5.1)
Sodium: 138 mmol/L (ref 135–145)
Total Bilirubin: 0.7 mg/dL (ref 0.3–1.2)
Total Protein: 6.5 g/dL (ref 6.5–8.1)

## 2020-10-09 LAB — CBC
HCT: 34.1 % — ABNORMAL LOW (ref 36.0–46.0)
Hemoglobin: 10.8 g/dL — ABNORMAL LOW (ref 12.0–15.0)
MCH: 29.3 pg (ref 26.0–34.0)
MCHC: 31.7 g/dL (ref 30.0–36.0)
MCV: 92.7 fL (ref 80.0–100.0)
Platelets: 173 10*3/uL (ref 150–400)
RBC: 3.68 MIL/uL — ABNORMAL LOW (ref 3.87–5.11)
RDW: 12.6 % (ref 11.5–15.5)
WBC: 6.5 10*3/uL (ref 4.0–10.5)
nRBC: 0 % (ref 0.0–0.2)

## 2020-10-09 LAB — MAGNESIUM: Magnesium: 2.2 mg/dL (ref 1.7–2.4)

## 2020-10-09 LAB — HEPARIN LEVEL (UNFRACTIONATED): Heparin Unfractionated: 1.12 IU/mL — ABNORMAL HIGH (ref 0.30–0.70)

## 2020-10-09 LAB — APTT
aPTT: 97 seconds — ABNORMAL HIGH (ref 24–36)
aPTT: 91 seconds — ABNORMAL HIGH (ref 24–36)

## 2020-10-09 MED ORDER — CHLORHEXIDINE GLUCONATE CLOTH 2 % EX PADS
6.0000 | MEDICATED_PAD | Freq: Every day | CUTANEOUS | Status: DC
  Administered 2020-10-09 – 2020-10-10 (×2): 6 via TOPICAL

## 2020-10-09 MED ORDER — DOFETILIDE 125 MCG PO CAPS
125.0000 ug | ORAL_CAPSULE | Freq: Two times a day (BID) | ORAL | Status: DC
  Administered 2020-10-09 – 2020-10-12 (×6): 125 ug via ORAL
  Filled 2020-10-09 (×7): qty 1

## 2020-10-09 MED ORDER — ACETAMINOPHEN 325 MG PO TABS
650.0000 mg | ORAL_TABLET | ORAL | Status: DC | PRN
  Administered 2020-10-09: 650 mg via ORAL
  Filled 2020-10-09: qty 2

## 2020-10-09 NOTE — Assessment & Plan Note (Signed)
Continue PPI ?

## 2020-10-09 NOTE — Assessment & Plan Note (Addendum)
-   presented with CP, found to be in a fib RVR w/ elevated trp -Now rate controlled. Home Tikosyn has been resumed per cardiology -Heparin drip discontinued and home Eliquis also resumed -Plan is for Tikosyn loading per cardiology. She was reloaded inpatient with stable QTc on serial EKGs

## 2020-10-09 NOTE — Assessment & Plan Note (Signed)
-  Continue Avapro and Aldactone

## 2020-10-09 NOTE — Assessment & Plan Note (Signed)
-  Mild with non-anion gap

## 2020-10-09 NOTE — Progress Notes (Addendum)
Progress Note  Patient Name: Barbara [name] Date of Encounter: 10/09/2020  [facility] Cardiologist: Shelva Majestic, MD   Subjective   Denies any CP or SOB.   Inpatient Medications    Scheduled Meds: . acetaminophen  1,000 mg Oral QHS  . aspirin EC  81 mg Oral Daily  . Chlorhexidine Gluconate Cloth  6 each Topical Daily  . Chlorhexidine Gluconate Cloth  6 each Topical Q0600  . irbesartan  150 mg Oral Daily  . mouth rinse  15 mL Mouth Rinse BID  . pantoprazole  40 mg Oral Daily  . rosuvastatin  40 mg Oral QHS  . spironolactone  25 mg Oral Daily   Continuous Infusions: . heparin 700 Units/hr (10/09/20 0222)   PRN Meds: acetaminophen, lip balm, methocarbamol   Vital Signs    Vitals:   10/09/20 0000 10/09/20 0700 10/09/20 0800 10/09/20 1140  BP: (!) 143/39  (!) 175/51 (!) 154/50  Pulse: (!) 44  (!) 51 (!) 53  Resp: 18  18 16   Temp:  98.6 F (37 C) 98 F (36.7 C)   TempSrc:  Oral Oral   SpO2: 97%  97% 100%  Weight:      Height:        Intake/Output Summary (Last 24 hours) at 10/09/2020 1252 Last data filed at 10/09/2020 0800 Gross per 24 hour  Intake 550.36 ml  Output 450 ml  Net 100.36 ml   Last 3 Weights 10/08/2020 10/07/2020 10/07/2020  Weight (lbs) 169 lb 12.1 oz 169 lb 5 oz 162 lb  Weight (kg) 77 kg 76.8 kg 73.483 kg      Telemetry    Sinus bradycardia with HR 45-50s - Personally Reviewed  ECG    10/07/2020 junctional bradycardia - Personally Reviewed  Physical Exam   GEN: No acute distress.   Neck: No JVD Cardiac: RRR, no murmurs, rubs, or gallops.  Respiratory: Clear to auscultation bilaterally. GI: Soft, nontender, non-distended  MS: No edema; No deformity. Neuro:  Nonfocal  Psych: Normal affect   Labs    High Sensitivity Troponin:   Recent Labs  Lab 10/07/20 1822 10/07/20 1949 10/07/20 2112 10/08/20 0920 10/08/20 1148  TROPONINIHS 639* 737* 763* 736* 623*      Chemistry Recent Labs  Lab 10/07/20 1152 10/08/20 0920  10/09/20 0234  NA 138 139 138  K 4.7 4.4 4.1  CL 107 111 108  CO2 21* 20* 23  GLUCOSE 113* 101* 135*  BUN 31* 27* 30*  CREATININE 1.30* 1.32* 1.52*  CALCIUM 10.3 9.9 9.7  PROT  --  6.5 6.5  ALBUMIN  --  3.8 3.5  AST  --  27 22  ALT  --  15 16  ALKPHOS  --  57 66  BILITOT  --  0.7 0.7  GFRNONAA 41* 41* 34*  ANIONGAP 10 8 7      Hematology Recent Labs  Lab 10/08/20 0920 10/08/20 1636 10/09/20 0234  WBC 5.2 5.7 6.5  RBC 4.03 3.62* 3.68*  HGB 11.8* 10.8* 10.8*  HCT 36.8 33.7* 34.1*  MCV 91.3 93.1 92.7  MCH 29.3 29.8 29.3  MCHC 32.1 32.0 31.7  RDW 12.8 12.7 12.6  PLT PLATELET CLUMPS NOTED ON SMEAR, COUNT APPEARS ADEQUATE 171 173    BNPNo results for input(s): BNP, PROBNP in the last 168 hours.   DDimer No results for input(s): DDIMER in the last 168 hours.   Radiology    ECHOCARDIOGRAM COMPLETE  Result Date: 10/08/2020    ECHOCARDIOGRAM REPORT  Patient Name:   Barbara [name] [facility] Date of Exam: 10/08/2020 Medical Rec #:  [id number]              Height:       66.0 in Accession #:    [id number]             Weight:       169.8 lb Date of Birth:  03/01/1941              BSA:          1.865 m Patient Age:    79 years               BP:           162/52 mmHg Patient Gender: F                      HR:           47 bpm. Exam Location:  Inpatient Procedure: 2D Echo Indications:    I21.4 NSTEMI  History:        Patient has prior history of Echocardiogram examinations, most                 recent 05/18/2020. CHF, CAD, Arrythmias:Atrial Fibrillation; Risk                 Factors:Hypertension, Dyslipidemia and Former Smoker. NSTEMI.  Sonographer:    Jannett Celestine RDCS (AE) Referring Phys: [id number] [place]  1. Left ventricular ejection fraction, by estimation, is 70 to 75%. The left ventricle has hyperdynamic function. The left ventricle has no regional wall motion abnormalities. There is moderate eccentric left ventricular hypertrophy of the apical segment. Left ventricular  diastolic parameters are indeterminate. Elevated left ventricular end-diastolic pressure.  2. Right ventricular systolic function is normal. The right ventricular size is normal. Tricuspid regurgitation signal is inadequate for assessing PA pressure.  3. The mitral valve is abnormal. Mild mitral valve regurgitation. Moderate mitral annular calcification.  4. The aortic valve is grossly normal. There is mild calcification of the aortic valve. There is mild thickening of the aortic valve. Aortic valve regurgitation is not visualized. Mild aortic valve sclerosis is present, with no evidence of aortic valve stenosis. Comparison(s): No significant change from prior study. Conclusion(s)/Recommendation(s): Otherwise normal echocardiogram, with minor abnormalities described in the report. Heart rate in the 40s on this study. Otherwise largely unchanged, apical hypertrophy confirmed with contrast imaging (noted previously). FINDINGS  Left Ventricle: Left ventricular ejection fraction, by estimation, is 70 to 75%. The left ventricle has hyperdynamic function. The left ventricle has no regional wall motion abnormalities. Definity contrast agent was given IV to delineate the left ventricular endocardial borders. The left ventricular internal cavity size was normal in size. There is moderate eccentric left ventricular hypertrophy of the apical segment. Left ventricular diastolic parameters are indeterminate. Elevated left ventricular end-diastolic pressure. Right Ventricle: The right ventricular size is normal. Right vetricular wall thickness was not well visualized. Right ventricular systolic function is normal. Tricuspid regurgitation signal is inadequate for assessing PA pressure. Left Atrium: Left atrial size was not well visualized. Right Atrium: Right atrial size was not well visualized. Pericardium: Trivial pericardial effusion is present. Mitral Valve: The mitral valve is abnormal. There is mild thickening of the mitral  valve leaflet(s). There is mild calcification of the mitral valve leaflet(s). Moderate mitral annular calcification. Mild mitral valve regurgitation. Tricuspid Valve: The tricuspid valve is normal in structure. Tricuspid valve regurgitation  is trivial. No evidence of tricuspid stenosis. Aortic Valve: The aortic valve is grossly normal. There is mild calcification of the aortic valve. There is mild thickening of the aortic valve. Aortic valve regurgitation is not visualized. Mild aortic valve sclerosis is present, with no evidence of aortic valve stenosis. Pulmonic Valve: The pulmonic valve was not well visualized. Pulmonic valve regurgitation is trivial. Aorta: The aortic root and ascending aorta are structurally normal, with no evidence of dilitation. Venous: The inferior vena cava was not well visualized. IAS/Shunts: The atrial septum is grossly normal.  LEFT VENTRICLE PLAX 2D LVIDd:         4.90 cm  Diastology LVIDs:         2.70 cm  LV e' medial:    5.11 cm/s LV PW:         0.90 cm  LV E/e' medial:  23.3 LV IVS:        1.10 cm  LV e' lateral:   7.07 cm/s LVOT diam:     1.90 cm  LV E/e' lateral: 16.8 LV SV:         89 LV SV Index:   48 LVOT Area:     2.84 cm  RIGHT VENTRICLE RV S prime:     10.80 cm/s TAPSE (M-mode): 1.8 cm LEFT ATRIUM         Index LA diam:    4.80 cm 2.57 cm/m  AORTIC VALVE LVOT Vmax:   142.00 cm/s LVOT Vmean:  95.200 cm/s LVOT VTI:    0.313 m  AORTA Ao Root diam: 2.60 cm MITRAL VALVE MV Area (PHT): 2.21 cm     SHUNTS MV Decel Time: 343 msec     Systemic VTI:  0.31 m MV E velocity: 119.00 cm/s  Systemic Diam: 1.90 cm Buford Dresser MD Electronically signed by Buford Dresser MD Signature Date/Time: 10/08/2020/1:36:02 PM    Final     Cardiac Studies   Cath 02/26/2019  Prox RCA to Mid RCA lesion is 15% stenosed.  Ost LAD to Prox LAD lesion is 20% stenosed.  Prox Cx lesion is 20% stenosed.   Mild non-obstructive coronary artery disease with a patent proximal LAD stent  with mild 20% intimal hyperplasia (inserted  09/1999); 20% proximal left circumflex narrowing, and mild irregularity of 10 to 15% in the mid RCA.  Hyperdynamic LV function with an "Ace of Spade "configuration and near cavity obliteration in the mid to apical segment during systole.  There is evidence for left ventricular hypertrophy.  LVEDP is 16 mm.  RECOMMENDATION: Medical therapy with optimal blood pressure control, lipid management, and resumption of Eliquis tomorrow.   Echo 10/08/2020 1. Left ventricular ejection fraction, by estimation, is 70 to 75%. The  left ventricle has hyperdynamic function. The left ventricle has no  regional wall motion abnormalities. There is moderate eccentric left  ventricular hypertrophy of the apical  segment. Left ventricular diastolic parameters are indeterminate. Elevated  left ventricular end-diastolic pressure.  2. Right ventricular systolic function is normal. The right ventricular  size is normal. Tricuspid regurgitation signal is inadequate for assessing  PA pressure.  3. The mitral valve is abnormal. Mild mitral valve regurgitation.  Moderate mitral annular calcification.  4. The aortic valve is grossly normal. There is mild calcification of the  aortic valve. There is mild thickening of the aortic valve. Aortic valve  regurgitation is not visualized. Mild aortic valve sclerosis is present,  with no evidence of aortic valve  stenosis.   Comparison(s): No  significant change from prior study.    Patient Profile     79 y.o. female with PMH of CAD, HTN, HLD, OSA, PAF on Eliquis and h/o bradycardia presented with afib with RVR, given coreg and IV diltiazem on top of home tikosyn, patient subsequently went into junctional rhythm. Now back in sinus bradycardia with HR 45-low 50s. Trop trended up to 600 without chest pain.   Assessment & Plan    1. Junctional bradycardia  - occurred in the setting when trying to rate control the patient by  adding coreg and diltiazem on top of tikosyn, all AV nodal agents have been discontinued  - ok to transfer to progressive or stepdown. Will restart tikosyn today, tikosyn unlikely to cause bradycardia    2. PAF with RVR:   -since her last documented afib was in Jan 2019, we likely will continue with previous rhythm control using tikosyn without any AV nodal blocking agent  -Restart Tikosyn, note, patient received a PM dose of tikosyn on 12/4, but no tikosyn on 12/5.   3. Elevated troponin: minimal CAD on cath in Apr 2020, given lack of chest pain, likely demand ischemia in the setting of severe junctional rhythm and afib  - Echo showed normal EF without WMA  - may consider stop aspirin if felt no ACS, and switch IV heparin back to home Eliquis  4. CAD: minimal nonobstructive CAD in Apr 2020  5. HTN  6. HLD  For questions or updates, please contact [place] Please consult [email or web address] for contact info under   Signed, Almyra Deforest, [place]  10/09/2020, 12:52 PM   As above, patient seen and examined.  She denies dyspnea or chest pain.  She was admitted with atrial fibrillation with rapid ventricular response and converted spontaneously.  She was given Cardizem and Coreg and developed junctional bradycardia.  These medications are now on hold and her heart rates in the 50s and in sinus.  Her Tikosyn has been held which will need to be resumed.  We will change heparin to Xarelto tomorrow.  Likely can be discharged in 48 hours.  Note her troponin elevation is felt secondary to atrial fibrillation.  If she has more frequent episodes of atrial fibrillation in the future we will need to consider different antiarrhythmic such as amiodarone.  However she would likely need pacemaker given history of tachybradycardia.  Hopefully her episodes will be infrequent with Tikosyn.  Kirk Ruths, MD

## 2020-10-09 NOTE — Progress Notes (Addendum)
PROGRESS NOTE    Barbara [name]   KGM:[id number]  DOB: May 17, 1941  DOA: 10/07/2020     1  PCP: Laurey Morale, MD  CC: palpitations, CP  Hospital Course: Barbara [name] is an 79 y.o. female who has CAD. Patient presented to the hospital after developing palpitations when trying to assist her husband at home after he had fallen on the floor.  She was also having some chest pain.  EMS brought patient to the hospital and she was found to be in A. fib with RVR. She was started on diltiazem, Coreg, and continued on home Tikosyn.  She then became bradycardic with intermittent junctional rhythms.  All AV nodal agents were stopped. She also developed elevated troponin which trended up and was started on a heparin drip for possible ACS.  Cardiology was consulted.  Echo was performed and showed normal EF with no wall motion abnormalities.  Elevated troponins were considered demand ischemia in setting of her RVR initially on admission. She was resumed back on home Tikosyn on 10/09/2020.   Interval History:  Monitored in the ICU overnight.  Still bradycardic this morning but has no symptoms.  Denies any chest pain or shortness of breath.  Is asking when she might be able to go home.  Old records reviewed in assessment of this patient  ROS: Constitutional: negative for chills and fevers, Respiratory: negative for cough, Cardiovascular: negative for chest pain and Gastrointestinal: negative for abdominal pain  Assessment & Plan: Type II NSTEMI CP A fib RVR      - presented with CP, found to be in a fib RVR w/ elevated trp     - she was given dilt, carvedilol and tikosyn     - she became bradycardic after converting; moved to ICU for closer monitoring. Stable and okay to transfer back to SDU     - 12/5: cards has eval'd; hold all A/V nodal blocking agents - normal EF on echo; no WMA - tikosyn resumed per cards on 12/6 - follow up with cardiology regarding d/c heparin drip and asa and  resuming home Eliquis  CKD 3b     - she is at baseline SCr     - watch nephro toxins  HTN     - irbesartan, aldactone  Metabolic acidosis - resolved      - mild, non-gap; follow for now  GERD     - PPI  Antimicrobials:   DVT prophylaxis: Heparin drip Code Status: Full Family Communication: None present Disposition Plan: Status is: Inpatient  Remains inpatient appropriate because:Ongoing diagnostic testing needed not appropriate for outpatient work up, IV treatments appropriate due to intensity of illness or inability to take PO and Inpatient level of care appropriate due to severity of illness   Dispo: The patient is from: Home              Anticipated d/c is to: Home              Anticipated d/c date is: 2 days              Patient currently is not medically stable to d/c.   Objective: Blood pressure (!) 154/50, pulse (!) 51, temperature 99 F (37.2 C), temperature source Axillary, resp. rate 12, height 5\' 6"  (1.676 m), weight 77 kg, SpO2 100 %.  Examination: General appearance: alert, cooperative and no distress Head: Normocephalic, without obvious abnormality, atraumatic Eyes: EOMI Lungs: clear to auscultation bilaterally Heart: irregularly irregular rhythm, S1, S2  normal and bradycardic. 3/6 HSM in USBs Abdomen: normal findings: bowel sounds normal and soft, non-tender Extremities: no edema Skin: mobility and turgor normal Neurologic: Grossly normal  Consultants:   Cardiology  Procedures:     Data Reviewed: I have personally reviewed following labs and imaging studies Results for orders placed or performed during the hospital encounter of 10/07/20 (from the past 24 hour(s))  APTT     Status: Abnormal   Collection Time: 10/08/20  4:36 PM  Result Value Ref Range   aPTT 146 (H) 24 - 36 seconds  CBC     Status: Abnormal   Collection Time: 10/08/20  4:36 PM  Result Value Ref Range   WBC 5.7 4.0 - 10.5 K/uL   RBC 3.62 (L) 3.87 - 5.11 MIL/uL    Hemoglobin 10.8 (L) 12.0 - 15.0 g/dL   HCT 33.7 (L) 36 - 46 %   MCV 93.1 80.0 - 100.0 fL   MCH 29.8 26.0 - 34.0 pg   MCHC 32.0 30.0 - 36.0 g/dL   RDW 12.7 11.5 - 15.5 %   Platelets 171 150 - 400 K/uL   nRBC 0.0 0.0 - 0.2 %  CBC     Status: Abnormal   Collection Time: 10/09/20  2:34 AM  Result Value Ref Range   WBC 6.5 4.0 - 10.5 K/uL   RBC 3.68 (L) 3.87 - 5.11 MIL/uL   Hemoglobin 10.8 (L) 12.0 - 15.0 g/dL   HCT 34.1 (L) 36 - 46 %   MCV 92.7 80.0 - 100.0 fL   MCH 29.3 26.0 - 34.0 pg   MCHC 31.7 30.0 - 36.0 g/dL   RDW 12.6 11.5 - 15.5 %   Platelets 173 150 - 400 K/uL   nRBC 0.0 0.0 - 0.2 %  Comprehensive metabolic panel     Status: Abnormal   Collection Time: 10/09/20  2:34 AM  Result Value Ref Range   Sodium 138 135 - 145 mmol/L   Potassium 4.1 3.5 - 5.1 mmol/L   Chloride 108 98 - 111 mmol/L   CO2 23 22 - 32 mmol/L   Glucose, Bld 135 (H) 70 - 99 mg/dL   BUN 30 (H) 8 - 23 mg/dL   Creatinine, Ser 1.52 (H) 0.44 - 1.00 mg/dL   Calcium 9.7 8.9 - 10.3 mg/dL   Total Protein 6.5 6.5 - 8.1 g/dL   Albumin 3.5 3.5 - 5.0 g/dL   AST 22 15 - 41 U/L   ALT 16 0 - 44 U/L   Alkaline Phosphatase 66 38 - 126 U/L   Total Bilirubin 0.7 0.3 - 1.2 mg/dL   GFR, Estimated 34 (L) >60 mL/min   Anion gap 7 5 - 15  Magnesium     Status: None   Collection Time: 10/09/20  2:34 AM  Result Value Ref Range   Magnesium 2.2 1.7 - 2.4 mg/dL  APTT     Status: Abnormal   Collection Time: 10/09/20  2:34 AM  Result Value Ref Range   aPTT 97 (H) 24 - 36 seconds  Heparin level (unfractionated)     Status: Abnormal   Collection Time: 10/09/20  2:34 AM  Result Value Ref Range   Heparin Unfractionated 1.12 (H) 0.30 - 0.70 IU/mL  APTT     Status: Abnormal   Collection Time: 10/09/20 10:40 AM  Result Value Ref Range   aPTT 91 (H) 24 - 36 seconds    Recent Results (from the past 240 hour(s))  Resp Panel by RT-PCR (  Flu A&B, Covid) Nasopharyngeal Swab     Status: None   Collection Time: 10/07/20  2:06 PM    Specimen: Nasopharyngeal Swab; Nasopharyngeal(NP) swabs in vial transport medium  Result Value Ref Range Status   SARS Coronavirus 2 by RT PCR NEGATIVE NEGATIVE Final    Comment: (NOTE) SARS-CoV-2 target nucleic acids are NOT DETECTED.  The SARS-CoV-2 RNA is generally detectable in upper respiratory specimens during the acute phase of infection. The lowest concentration of SARS-CoV-2 viral copies this assay can detect is 138 copies/mL. A negative result does not preclude SARS-Cov-2 infection and should not be used as the sole basis for treatment or other patient management decisions. A negative result may occur with  improper specimen collection/handling, submission of specimen other than nasopharyngeal swab, presence of viral mutation(s) within the areas targeted by this assay, and inadequate number of viral copies(<138 copies/mL). A negative result must be combined with clinical observations, patient history, and epidemiological information. The expected result is Negative.  Fact Sheet for Patients:  EntrepreneurPulse.com.au  Fact Sheet for Healthcare Providers:  IncredibleEmployment.be  This test is no t yet approved or cleared by the [place] FDA and  has been authorized for detection and/or diagnosis of SARS-CoV-2 by FDA under an Emergency Use Authorization (EUA). This EUA will remain  in effect (meaning this test can be used) for the duration of the COVID-19 declaration under Section 564(b)(1) of the Act, 21 U.S.C.section 360bbb-3(b)(1), unless the authorization is terminated  or revoked sooner.       Influenza A by PCR NEGATIVE NEGATIVE Final   Influenza B by PCR NEGATIVE NEGATIVE Final    Comment: (NOTE) The Xpert Xpress SARS-CoV-2/FLU/RSV plus assay is intended as an aid in the diagnosis of influenza from Nasopharyngeal swab specimens and should not be used as a sole basis for treatment. Nasal washings and aspirates are  unacceptable for Xpert Xpress SARS-CoV-2/FLU/RSV testing.  Fact Sheet for Patients: EntrepreneurPulse.com.au  Fact Sheet for Healthcare Providers: IncredibleEmployment.be  This test is not yet approved or cleared by the [place] FDA and has been authorized for detection and/or diagnosis of SARS-CoV-2 by FDA under an Emergency Use Authorization (EUA). This EUA will remain in effect (meaning this test can be used) for the duration of the COVID-19 declaration under Section 564(b)(1) of the Act, 21 U.S.C. section 360bbb-3(b)(1), unless the authorization is terminated or revoked.  Performed at [facility], [place] [address] [place], [place] [address]   MRSA PCR Screening     Status: None   Collection Time: 10/07/20 11:42 PM   Specimen: Nasal Mucosa; Nasopharyngeal  Result Value Ref Range Status   MRSA by PCR NEGATIVE NEGATIVE Final    Comment:        The GeneXpert MRSA Assay (FDA approved for NASAL specimens only), is one component of a comprehensive MRSA colonization surveillance program. It is not intended to diagnose MRSA infection nor to guide or monitor treatment for MRSA infections. Performed at [facility], [place] [address], [place], [place] [address]      Radiology Studies: ECHOCARDIOGRAM COMPLETE  Result Date: 10/08/2020    ECHOCARDIOGRAM REPORT   Patient Name:   KAMELA BLANSETT [facility] Date of Exam: 10/08/2020 Medical Rec #:  [id number]              Height:       66.0 in Accession #:    [id number]             Weight:  169.8 lb Date of Birth:  1940/11/28              BSA:          1.865 m Patient Age:    51 years               BP:           162/52 mmHg Patient Gender: F                      HR:           47 bpm. Exam Location:  Inpatient Procedure: 2D Echo Indications:    I21.4 NSTEMI  History:        Patient has prior history of Echocardiogram examinations, most                 recent  05/18/2020. CHF, CAD, Arrythmias:Atrial Fibrillation; Risk                 Factors:Hypertension, Dyslipidemia and Former Smoker. NSTEMI.  Sonographer:    Jannett Celestine RDCS (AE) Referring Phys: [id number] [place]  1. Left ventricular ejection fraction, by estimation, is 70 to 75%. The left ventricle has hyperdynamic function. The left ventricle has no regional wall motion abnormalities. There is moderate eccentric left ventricular hypertrophy of the apical segment. Left ventricular diastolic parameters are indeterminate. Elevated left ventricular end-diastolic pressure.  2. Right ventricular systolic function is normal. The right ventricular size is normal. Tricuspid regurgitation signal is inadequate for assessing PA pressure.  3. The mitral valve is abnormal. Mild mitral valve regurgitation. Moderate mitral annular calcification.  4. The aortic valve is grossly normal. There is mild calcification of the aortic valve. There is mild thickening of the aortic valve. Aortic valve regurgitation is not visualized. Mild aortic valve sclerosis is present, with no evidence of aortic valve stenosis. Comparison(s): No significant change from prior study. Conclusion(s)/Recommendation(s): Otherwise normal echocardiogram, with minor abnormalities described in the report. Heart rate in the 40s on this study. Otherwise largely unchanged, apical hypertrophy confirmed with contrast imaging (noted previously). FINDINGS  Left Ventricle: Left ventricular ejection fraction, by estimation, is 70 to 75%. The left ventricle has hyperdynamic function. The left ventricle has no regional wall motion abnormalities. Definity contrast agent was given IV to delineate the left ventricular endocardial borders. The left ventricular internal cavity size was normal in size. There is moderate eccentric left ventricular hypertrophy of the apical segment. Left ventricular diastolic parameters are indeterminate. Elevated left ventricular  end-diastolic pressure. Right Ventricle: The right ventricular size is normal. Right vetricular wall thickness was not well visualized. Right ventricular systolic function is normal. Tricuspid regurgitation signal is inadequate for assessing PA pressure. Left Atrium: Left atrial size was not well visualized. Right Atrium: Right atrial size was not well visualized. Pericardium: Trivial pericardial effusion is present. Mitral Valve: The mitral valve is abnormal. There is mild thickening of the mitral valve leaflet(s). There is mild calcification of the mitral valve leaflet(s). Moderate mitral annular calcification. Mild mitral valve regurgitation. Tricuspid Valve: The tricuspid valve is normal in structure. Tricuspid valve regurgitation is trivial. No evidence of tricuspid stenosis. Aortic Valve: The aortic valve is grossly normal. There is mild calcification of the aortic valve. There is mild thickening of the aortic valve. Aortic valve regurgitation is not visualized. Mild aortic valve sclerosis is present, with no evidence of aortic valve stenosis. Pulmonic Valve: The pulmonic valve was not well visualized. Pulmonic valve regurgitation is  trivial. Aorta: The aortic root and ascending aorta are structurally normal, with no evidence of dilitation. Venous: The inferior vena cava was not well visualized. IAS/Shunts: The atrial septum is grossly normal.  LEFT VENTRICLE PLAX 2D LVIDd:         4.90 cm  Diastology LVIDs:         2.70 cm  LV e' medial:    5.11 cm/s LV PW:         0.90 cm  LV E/e' medial:  23.3 LV IVS:        1.10 cm  LV e' lateral:   7.07 cm/s LVOT diam:     1.90 cm  LV E/e' lateral: 16.8 LV SV:         89 LV SV Index:   48 LVOT Area:     2.84 cm  RIGHT VENTRICLE RV S prime:     10.80 cm/s TAPSE (M-mode): 1.8 cm LEFT ATRIUM         Index LA diam:    4.80 cm 2.57 cm/m  AORTIC VALVE LVOT Vmax:   142.00 cm/s LVOT Vmean:  95.200 cm/s LVOT VTI:    0.313 m  AORTA Ao Root diam: 2.60 cm MITRAL VALVE MV Area  (PHT): 2.21 cm     SHUNTS MV Decel Time: 343 msec     Systemic VTI:  0.31 m MV E velocity: 119.00 cm/s  Systemic Diam: 1.90 cm Buford Dresser MD Electronically signed by Buford Dresser MD Signature Date/Time: 10/08/2020/1:36:02 PM    Final    DG Chest Port 1 View  Final Result      Scheduled Meds: . acetaminophen  1,000 mg Oral QHS  . aspirin EC  81 mg Oral Daily  . Chlorhexidine Gluconate Cloth  6 each Topical Daily  . Chlorhexidine Gluconate Cloth  6 each Topical Q0600  . dofetilide  125 mcg Oral BID  . irbesartan  150 mg Oral Daily  . mouth rinse  15 mL Mouth Rinse BID  . pantoprazole  40 mg Oral Daily  . rosuvastatin  40 mg Oral QHS  . spironolactone  25 mg Oral Daily   PRN Meds: acetaminophen, lip balm, methocarbamol Continuous Infusions: . heparin 700 Units/hr (10/09/20 0222)     LOS: 1 day  Time spent: Greater than 50% of the 35 minute visit was spent in counseling/coordination of care for the patient as laid out in the A&P.   Dwyane Dee, MD [facility] 10/09/2020, 2:04 PM

## 2020-10-09 NOTE — Plan of Care (Signed)
Discussed with patient plan of care for the evening, pain management, bath and transfer to another floor with some teach back displayed.  Problem: Education: Goal: Knowledge of General Education information will improve Description: Including pain rating scale, medication(s)/side effects and non-pharmacologic comfort measures Outcome: Progressing   Problem: Health Behavior/Discharge Planning: Goal: Ability to manage health-related needs will improve Outcome: Progressing

## 2020-10-09 NOTE — Progress Notes (Signed)
[place] for heparin Indication: Atrial fibrillation & possible NSTEMI  Allergies  Allergen Reactions  . Lidocaine Anaphylaxis  . Morphine Other (See Comments)    "Body shuts down"  . Procaine Hcl Anaphylaxis, Rash and Other (See Comments)    "Anything with 'caine' in it "  . Sulfonamide Derivatives Hives  . Norco [Hydrocodone-Acetaminophen] Other (See Comments)    "Made me sick"  . Tramadol Nausea Only  . Vytorin [Ezetimibe-Simvastatin] Other (See Comments)    Unknown  . Tape Itching and Other (See Comments)    Patient prefers either paper tape or Coban wrap    Patient Measurements: Height: 5\' 6"  (167.6 cm) Weight: 77 kg (169 lb 12.1 oz) IBW/kg (Calculated) : 59.3 Heparin Dosing Weight: 73.5 kg  Vital Signs: Temp: 98.2 F (36.8 C) (12/05 2327) Temp Source: Oral (12/05 2327) BP: 143/39 (12/06 0000) Pulse Rate: 44 (12/06 0000)  Labs: Recent Labs    10/07/20 1152 10/07/20 1152 10/07/20 1406 10/07/20 1949 10/07/20 1949 10/07/20 2112 10/08/20 0313 10/08/20 0920 10/08/20 0920 10/08/20 1148 10/08/20 1636 10/09/20 0234  HGB 12.3   < >  --   --   --   --   --  11.8*   < >  --  10.8* 10.8*  HCT 38.2   < >  --   --   --   --   --  36.8  --   --  33.7* 34.1*  PLT 184   < >  --   --   --   --   --  PLATELET CLUMPS NOTED ON SMEAR, COUNT APPEARS ADEQUATE  --   --  171 173  APTT  --   --   --  37*   < >  --  141*  --   --   --  146* 97*  LABPROT  --   --   --   --   --   --  16.4*  --   --   --   --   --   INR  --   --   --   --   --   --  1.4*  --   --   --   --   --   HEPARINUNFRC  --   --   --  3.92*  --   --  >2.20*  --   --   --   --   --   CREATININE 1.30*  --   --   --   --   --   --  1.32*  --   --   --  1.52*  TROPONINIHS 79*  --    < > 737*   < > 763*  --  736*  --  623*  --   --    < > = values in this interval not displayed.    Assessment: 79 yo F on Eliquis 5 mg po bid PTA for Afib.  Pharmacy consulted to dose heparin  for atrial fibrillation and possible NSTEMI.   Eliquis 5 mg po bid PTA> last dose 12/4 at 09 am. SCr 1.3, CBC WNL.  Baseline heparin level 3.92- elevated as anticipated due to interaction with Eliquis.   10/09/2020:  aptt 97 sec- therapeutic on heparin @ 700 units/hr   Heparin level 1.12 - remains elevated due to recent DOAC use but trending down as anticipated  CBC: Hg 10.8. low but unchanged from yesterday; PLT  WNL  SCr 1.52, CrCl 30 ml/min -->worsening  Verified rate with RN.  No bleeding noted.   Lab drawn from opposite arm from heparin drip   Goal of Therapy:  Heparin level 0.3-0.7 units/ml aPTT 66-102 seconds Monitor platelets by anticoagulation protocol: Yes   Plan:  Continue heparin @ 700 units/hr Repeat aptt 8h  Daily aPTT/heparin level and daily CBC while on heparin  Netta Cedars, Pharm.D 10/09/2020 3:44 AM

## 2020-10-09 NOTE — Assessment & Plan Note (Signed)
-   currently at baseline - avoid nephro toxins as able

## 2020-10-09 NOTE — TOC Initial Note (Signed)
Transition of Care [facility]) - Initial/Assessment Note    Patient Details  Name: Barbara [name] MRN: [id number] Date of Birth: 31-Oct-1941  Transition of Care [facility]) CM/SW Contact:    Leeroy Cha, RN Phone Number: 10/09/2020, 8:18 AM  Clinical Narrative:                 79 y.o. female who has CAD and in November 2000 underwent stenting of a high-grade LAD stenosis with an [id number] 3.518 mm bare-metal stent. A nuclear perfusion study in November 2013 was unchanged from previously and continued to show normal perfusion without scar or ischemia. Ejection fraction was 68%. An echo Doppler study revealed an ejection fraction in the 55-60% range with grade 1 diastolic dysfunction. She had mild mitral annular calcification with mild MR, moderate LA dilatation, and mild pulmonary hypertension with estimated pressure 39 mm" [facility] by [facility] cardiology by Dr. Claiborne Billings who presents to the ED with tachycardia and chest pain.  Patient stated that her husband fell on the floor this morning and she had to call EMS to come pick him up.  While EMS was there she started having palpitations with a warm sensation, no presyncope and had a little bit of chest pain that was typical.  EMS brought her to the emergency department where she was found to have a rate of 150.  Patient states this is similar to her episode in September where they just thought it was from heartburn.  We had social work talk to the patient about who might take care of her husband at home.  Patient states she is fairly adherent to her medications but does admit that last week she did miss a dose of Eliquis.  While on diltiazem drip she felt perfectly fine and her rate was controlled and still in atrial fibrillation.  Her troponin gradually increased up to 700 in the evening  Otherwise, in the ED, WBC 6.0, Hgb 12.3, K4.7, NA 138, CO2 21, SCR 1.3 s/p ASA 325mg  in the ED Troponin was 70 and ED provider spoke with cardiology. -diltiazem drip with  control -Cardiology consult in the am probably Started heparin gtt PLAN: patient lives with husband who fell may need hhc for csw to access home safety Following for progression Expected Discharge Plan: Home/Self Care Barriers to Discharge: No Barriers Identified   Patient Goals and CMS Choice Patient states their goals for this hospitalization and ongoing recovery are:: to return to home CMS Medicare.gov Compare Post Acute Care list provided to:: Patient Choice offered to / list presented to : Patient  Expected Discharge Plan and Services Expected Discharge Plan: Home/Self Care   Discharge Planning Services: CM Consult   Living arrangements for the past 2 months: Single Family Home                                      Prior Living Arrangements/Services Living arrangements for the past 2 months: Single Family Home Lives with:: Spouse Patient language and need for interpreter reviewed:: Yes Do you feel safe going back to the place where you live?: Yes      Need for Family Participation in Patient Care: Yes (Comment) Care giver support system in place?: Yes (comment)   Criminal Activity/Legal Involvement Pertinent to Current Situation/Hospitalization: No - Comment as needed  Activities of Daily Living Home Assistive Devices/Equipment: None ADL Screening (condition at time of admission) Patient's cognitive ability adequate to  safely complete daily activities?: Yes Is the patient deaf or have difficulty hearing?: No Does the patient have difficulty seeing, even when wearing glasses/contacts?: No Does the patient have difficulty concentrating, remembering, or making decisions?: No Patient able to express need for assistance with ADLs?: Yes Does the patient have difficulty dressing or bathing?: No Independently performs ADLs?: Yes (appropriate for developmental age) Does the patient have difficulty walking or climbing stairs?: No Weakness of Legs: None Weakness of  Arms/Hands: None  Permission Sought/Granted                  Emotional Assessment Appearance:: Appears stated age Attitude/Demeanor/Rapport: Engaged Affect (typically observed): Calm Orientation: : Oriented to Self, Oriented to Place, Oriented to  Time, Oriented to Situation Alcohol / Substance Use: Not Applicable Psych Involvement: No (comment)  Admission diagnosis:  Atrial fibrillation with RVR ([place]) [I48.91] Atrial fibrillation, rapid ([place]) [I48.91] Patient Active Problem List   Diagnosis Date Noted  . Atrial fibrillation with RVR ([place]) 10/08/2020  . Atrial fibrillation, rapid ([place]) 10/07/2020  . CKD (chronic kidney disease), stage III ([place]) 05/20/2020  . Apical variant hypertrophic cardiomyopathy ([place])   . Chest pain of uncertain etiology   . Chest pain   . Elevated troponin   . Pure hypercholesterolemia   . 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 PAF (paroxysmal atrial fibrillation) ([place]) 02/28/2019  . NSTEMI (non-ST elevated myocardial infarction) ([place]) 02/25/2019  . Pain in left knee 01/06/2018  . Dysphonia 12/29/2017  . 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 CHF (congestive heart failure) ([place]) 11/16/2017  . Gastroesophageal reflux disease 11/06/2017  . Bradycardia 12/27/2015  . Exertional dyspnea 12/23/2014  . Preoperative clearance 12/23/2014  . Hyperlipidemia LDL goal <70 09/13/2014  . Sinus bradycardia 09/13/2014  . LUMBAGO 05/03/2010  . VERTIGO 01/21/2009  . TEMPOROMANDIBULAR JOINT PAIN 08/31/2008  . Osteoarthritis 05/31/2008  . 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 HYPERLIPIDEMIA 04/06/2007  . Essential hypertension 04/06/2007  . CAD (coronary artery disease) 04/06/2007  . ALLERGIC RHINITIS 04/06/2007  . Personal history of colonic polyps 04/06/2007   PCP:  Laurey Morale, MD Pharmacy:   [place], [place] [address] Phone:  [phone] Fax: [phone]  [facility], [place] to Registered [employer] Sites [place] [address] Phone: [phone] Fax: [phone]     Social Determinants of Health (SDOH) Interventions    Readmission Risk Interventions No flowsheet data found.

## 2020-10-09 NOTE — Assessment & Plan Note (Addendum)
-   heparin drip transitioned back to Eliquis - no CP at this time

## 2020-10-09 NOTE — Hospital Course (Addendum)
Barbara [name] is an 79 y.o. female who has CAD. Patient presented to the hospital after developing palpitations when trying to assist her husband at home after he had fallen on the floor.  She was also having some chest pain.  EMS brought patient to the hospital and she was found to be in A. fib with RVR. She was started on diltiazem, Coreg, and continued on home Tikosyn.  She then became bradycardic with intermittent junctional rhythms.  All AV nodal agents were stopped. She also developed elevated troponin which trended up and was started on a heparin drip for possible ACS.  Cardiology was consulted.  Echo was performed and showed normal EF with no wall motion abnormalities.  Elevated troponins were considered demand ischemia in setting of her RVR initially on admission. She was resumed back on home Tikosyn on 10/09/2020.  She was monitored for 5 doses of Tikosyn per cardiology recommendation with serial EKGs.  QTC remained stable and she was able to be discharged home.

## 2020-10-09 NOTE — Assessment & Plan Note (Signed)
Continue statin. 

## 2020-10-09 NOTE — Progress Notes (Signed)
[place] for heparin Indication: Atrial fibrillation & possible NSTEMI  Allergies  Allergen Reactions  . Lidocaine Anaphylaxis  . Morphine Other (See Comments)    "Body shuts down"  . Procaine Hcl Anaphylaxis, Rash and Other (See Comments)    "Anything with 'caine' in it "  . Sulfonamide Derivatives Hives  . Norco [Hydrocodone-Acetaminophen] Other (See Comments)    "Made me sick"  . Tramadol Nausea Only  . Vytorin [Ezetimibe-Simvastatin] Other (See Comments)    Unknown  . Tape Itching and Other (See Comments)    Patient prefers either paper tape or Coban wrap    Patient Measurements: Height: 5\' 6"  (167.6 cm) Weight: 77 kg (169 lb 12.1 oz) IBW/kg (Calculated) : 59.3 Heparin Dosing Weight: 73.5 kg  Vital Signs: Temp: 98.6 F (37 C) (12/06 0700) Temp Source: Oral (12/06 0700) BP: 175/51 (12/06 0800) Pulse Rate: 51 (12/06 0800)  Labs: Recent Labs    10/07/20 1152 10/07/20 1152 10/07/20 1406 10/07/20 1949 10/07/20 1949 10/07/20 2112 10/08/20 0313 10/08/20 0920 10/08/20 0920 10/08/20 1148 10/08/20 1636 10/09/20 0234  HGB 12.3   < >  --   --   --   --   --  11.8*   < >  --  10.8* 10.8*  HCT 38.2   < >  --   --   --   --   --  36.8  --   --  33.7* 34.1*  PLT 184   < >  --   --   --   --   --  PLATELET CLUMPS NOTED ON SMEAR, COUNT APPEARS ADEQUATE  --   --  171 173  APTT  --   --   --  37*   < >  --  141*  --   --   --  146* 97*  LABPROT  --   --   --   --   --   --  16.4*  --   --   --   --   --   INR  --   --   --   --   --   --  1.4*  --   --   --   --   --   HEPARINUNFRC  --   --   --  3.92*  --   --  >2.20*  --   --   --   --  1.12*  CREATININE 1.30*  --   --   --   --   --   --  1.32*  --   --   --  1.52*  TROPONINIHS 79*  --    < > 737*   < > 763*  --  736*  --  623*  --   --    < > = values in this interval not displayed.    Assessment: 79 yo F on Eliquis 5 mg po bid PTA for Afib.  Pharmacy consulted to dose heparin  for atrial fibrillation and possible NSTEMI.   Eliquis 5 mg po bid PTA> last dose 12/4 at 09 am. SCr 1.3, CBC WNL.  Baseline heparin level 3.92- elevated as anticipated due to interaction with Eliquis.   Today, 10/09/2020:  Confirmatory aPTT 91 sec, remains therapeutic on heparin 700 units/hr   Morning Heparin level 1.12 - remains elevated due to recent DOAC use but trending down as anticipated  CBC: Hg 10.8. low but unchanged from yesterday;  PLT WNL  SCr up to 1.52, CrCl 30 ml/min -->worsening  Verified rate with RN.  No bleeding noted.   Lab drawn from opposite arm from heparin drip   Goal of Therapy:  Heparin level 0.3-0.7 units/ml aPTT 66-102 seconds Monitor platelets by anticoagulation protocol: Yes   Plan:  Continue heparin IV infusion at 700 units/hr Continue to dose on aPTT until it correlates with decreasing heparin levels d/t Eliquis interaction. Daily aPTT/heparin level and CBC    Gretta Arab PharmD, BCPS Clinical Pharmacist [facility] main pharmacy [phone] 10/09/2020 8:58 AM

## 2020-10-09 NOTE — Assessment & Plan Note (Addendum)
-   demand ischemia - continue current regimen

## 2020-10-09 NOTE — Assessment & Plan Note (Addendum)
-   cardiology following - tikosyn resumed 12/6 - hold AV nodal blocking agents -She remains asymptomatic. No indication for pacemaker at this time per cardiology

## 2020-10-10 ENCOUNTER — Other Ambulatory Visit: Payer: Self-pay

## 2020-10-10 DIAGNOSIS — I4891 Unspecified atrial fibrillation: Secondary | ICD-10-CM | POA: Diagnosis not present

## 2020-10-10 DIAGNOSIS — R001 Bradycardia, unspecified: Secondary | ICD-10-CM | POA: Diagnosis not present

## 2020-10-10 DIAGNOSIS — I248 Other forms of acute ischemic heart disease: Secondary | ICD-10-CM

## 2020-10-10 LAB — CBC WITH DIFFERENTIAL/PLATELET
Abs Immature Granulocytes: 0.01 10*3/uL (ref 0.00–0.07)
Basophils Absolute: 0 10*3/uL (ref 0.0–0.1)
Basophils Relative: 0 %
Eosinophils Absolute: 0.3 10*3/uL (ref 0.0–0.5)
Eosinophils Relative: 6 %
HCT: 34.6 % — ABNORMAL LOW (ref 36.0–46.0)
Hemoglobin: 10.9 g/dL — ABNORMAL LOW (ref 12.0–15.0)
Immature Granulocytes: 0 %
Lymphocytes Relative: 27 %
Lymphs Abs: 1.4 10*3/uL (ref 0.7–4.0)
MCH: 29.3 pg (ref 26.0–34.0)
MCHC: 31.5 g/dL (ref 30.0–36.0)
MCV: 93 fL (ref 80.0–100.0)
Monocytes Absolute: 0.5 10*3/uL (ref 0.1–1.0)
Monocytes Relative: 10 %
Neutro Abs: 2.9 10*3/uL (ref 1.7–7.7)
Neutrophils Relative %: 57 %
Platelets: 171 10*3/uL (ref 150–400)
RBC: 3.72 MIL/uL — ABNORMAL LOW (ref 3.87–5.11)
RDW: 12.6 % (ref 11.5–15.5)
WBC: 5.1 10*3/uL (ref 4.0–10.5)
nRBC: 0 % (ref 0.0–0.2)

## 2020-10-10 LAB — BASIC METABOLIC PANEL
Anion gap: 9 (ref 5–15)
BUN: 28 mg/dL — ABNORMAL HIGH (ref 8–23)
CO2: 24 mmol/L (ref 22–32)
Calcium: 9.8 mg/dL (ref 8.9–10.3)
Chloride: 106 mmol/L (ref 98–111)
Creatinine, Ser: 1.3 mg/dL — ABNORMAL HIGH (ref 0.44–1.00)
GFR, Estimated: 41 mL/min — ABNORMAL LOW (ref >60)
Glucose, Bld: 103 mg/dL — ABNORMAL HIGH (ref 70–99)
Potassium: 4.3 mmol/L (ref 3.5–5.1)
Sodium: 139 mmol/L (ref 135–145)

## 2020-10-10 LAB — MAGNESIUM: Magnesium: 2.1 mg/dL (ref 1.7–2.4)

## 2020-10-10 LAB — HEPARIN LEVEL (UNFRACTIONATED): Heparin Unfractionated: 0.76 IU/mL — ABNORMAL HIGH (ref 0.30–0.70)

## 2020-10-10 LAB — APTT: aPTT: 88 seconds — ABNORMAL HIGH (ref 24–36)

## 2020-10-10 MED ORDER — APIXABAN 5 MG PO TABS
5.0000 mg | ORAL_TABLET | Freq: Two times a day (BID) | ORAL | Status: DC
  Administered 2020-10-10 – 2020-10-12 (×5): 5 mg via ORAL
  Filled 2020-10-10 (×5): qty 1

## 2020-10-10 MED ORDER — SENNOSIDES-DOCUSATE SODIUM 8.6-50 MG PO TABS
1.0000 | ORAL_TABLET | Freq: Two times a day (BID) | ORAL | Status: DC
  Administered 2020-10-10 – 2020-10-12 (×5): 1 via ORAL
  Filled 2020-10-10 (×4): qty 1

## 2020-10-10 MED ORDER — POLYETHYLENE GLYCOL 3350 17 G PO PACK
17.0000 g | PACK | Freq: Every day | ORAL | Status: DC
  Administered 2020-10-10: 17 g via ORAL
  Filled 2020-10-10 (×3): qty 1

## 2020-10-10 NOTE — Progress Notes (Signed)
Pt transfer from ICU @ 1200 AM on wheelchair accompanied by RN, VSS WNL, O2 sat 100% on RA & no complaints at this time.

## 2020-10-10 NOTE — Assessment & Plan Note (Signed)
-   see afib with RVR

## 2020-10-10 NOTE — Progress Notes (Signed)
PROGRESS NOTE    Barbara [name]   XKP:[id number]  DOB: 1940/11/27  DOA: 10/07/2020     2  PCP: Laurey Morale, MD  CC: Palpitations, chest pain  Hospital Course: [name] is an 79 y.o. female who has CAD. Patient presented to the hospital after developing palpitations when trying to assist her husband at home after he had fallen on the floor.  She was also having some chest pain.  EMS brought patient to the hospital and she was found to be in A. fib with RVR. She was started on diltiazem, Coreg, and continued on home Tikosyn.  She then became bradycardic with intermittent junctional rhythms.  All AV nodal agents were stopped. She also developed elevated troponin which trended up and was started on a heparin drip for possible ACS.  Cardiology was consulted.  Echo was performed and showed normal EF with no wall motion abnormalities.  Elevated troponins were considered demand ischemia in setting of her RVR initially on admission. She was resumed back on home Tikosyn on 10/09/2020.   Interval History:  No events overnight. Still feels well and denies any chest pain, shortness of breath, palpitations. Hopeful for going home soon.  Old records reviewed in assessment of this patient  ROS: Constitutional: negative for chills and fevers, Respiratory: negative for cough, Cardiovascular: negative for chest pain and Gastrointestinal: negative for abdominal pain  Assessment & Plan: Atrial fibrillation with RVR ([place]) - presented with CP, found to be in a fib RVR w/ elevated trp -Now rate controlled. Home Tikosyn has been resumed per cardiology -Heparin drip discontinued and home Eliquis also resumed  Bradycardia - cardiology following - tikosyn resumed 12/6 - hold AV nodal blocking agents -She remains asymptomatic. No indication for pacemaker at this time per cardiology -Repeat EKG in a.m.  Persistent atrial fibrillation (HCC) - see afib with RVR  CAD (coronary artery  disease) - demand ischemia - continue current regimen  Demand ischemia (HCC)-resolved as of 10/10/2020 - heparin drip transitioned back to Eliquis - no CP at this time  CKD (chronic kidney disease), stage III ([place]) - currently at baseline - avoid nephro toxins as able  Gastroesophageal reflux disease -Continue PPI  Hyperlipidemia LDL goal <70 -Continue statin  Essential hypertension -Continue Avapro and Aldactone   Metabolic acidosis-resolved as of 10/09/2020 -Mild with non-anion gap    Antimicrobials:   DVT prophylaxis: Heparin drip now back on Eliquis Code Status: Full Family Communication: None present Disposition Plan: Status is: Inpatient  Remains inpatient appropriate because:Ongoing diagnostic testing needed not appropriate for outpatient work up, IV treatments appropriate due to intensity of illness or inability to take PO and Inpatient level of care appropriate due to severity of illness   Dispo: The patient is from: Home              Anticipated d/c is to: Home              Anticipated d/c date is: 1 day              Patient currently is not medically stable to d/c.  Objective: Blood pressure (!) 154/51, pulse 60, temperature 97.8 F (36.6 C), temperature source Oral, resp. rate 20, height 5\' 7"  (1.702 m), weight 77.1 kg, SpO2 99 %.  Examination: General appearance: alert, cooperative and no distress Head: Normocephalic, without obvious abnormality, atraumatic Eyes: EOMI Lungs: clear to auscultation bilaterally Heart: irregularly irregular rhythm, S1, S2 normal and bradycardic. 3/6 HSM in USBs Abdomen: normal findings:  bowel sounds normal and soft, non-tender Extremities: no edema Skin: mobility and turgor normal Neurologic: Grossly normal  Consultants:   Cardiology  Procedures:     Data Reviewed: I have personally reviewed following labs and imaging studies Results for orders placed or performed during the hospital encounter of 10/07/20  (from the past 24 hour(s))  Heparin level (unfractionated)     Status: Abnormal   Collection Time: 10/10/20  4:40 AM  Result Value Ref Range   Heparin Unfractionated 0.76 (H) 0.30 - 0.70 IU/mL  APTT     Status: Abnormal   Collection Time: 10/10/20  4:40 AM  Result Value Ref Range   aPTT 88 (H) 24 - 36 seconds  Basic metabolic panel     Status: Abnormal   Collection Time: 10/10/20  4:40 AM  Result Value Ref Range   Sodium 139 135 - 145 mmol/L   Potassium 4.3 3.5 - 5.1 mmol/L   Chloride 106 98 - 111 mmol/L   CO2 24 22 - 32 mmol/L   Glucose, Bld 103 (H) 70 - 99 mg/dL   BUN 28 (H) 8 - 23 mg/dL   Creatinine, Ser 1.30 (H) 0.44 - 1.00 mg/dL   Calcium 9.8 8.9 - 10.3 mg/dL   GFR, Estimated 41 (L) >60 mL/min   Anion gap 9 5 - 15  CBC with Differential/Platelet     Status: Abnormal   Collection Time: 10/10/20  4:40 AM  Result Value Ref Range   WBC 5.1 4.0 - 10.5 K/uL   RBC 3.72 (L) 3.87 - 5.11 MIL/uL   Hemoglobin 10.9 (L) 12.0 - 15.0 g/dL   HCT 34.6 (L) 36 - 46 %   MCV 93.0 80.0 - 100.0 fL   MCH 29.3 26.0 - 34.0 pg   MCHC 31.5 30.0 - 36.0 g/dL   RDW 12.6 11.5 - 15.5 %   Platelets 171 150 - 400 K/uL   nRBC 0.0 0.0 - 0.2 %   Neutrophils Relative % 57 %   Neutro Abs 2.9 1.7 - 7.7 K/uL   Lymphocytes Relative 27 %   Lymphs Abs 1.4 0.7 - 4.0 K/uL   Monocytes Relative 10 %   Monocytes Absolute 0.5 0.1 - 1.0 K/uL   Eosinophils Relative 6 %   Eosinophils Absolute 0.3 0.0 - 0.5 K/uL   Basophils Relative 0 %   Basophils Absolute 0.0 0.0 - 0.1 K/uL   Immature Granulocytes 0 %   Abs Immature Granulocytes 0.01 0.00 - 0.07 K/uL  Magnesium     Status: None   Collection Time: 10/10/20  4:40 AM  Result Value Ref Range   Magnesium 2.1 1.7 - 2.4 mg/dL    Recent Results (from the past 240 hour(s))  Resp Panel by RT-PCR (Flu A&B, Covid) Nasopharyngeal Swab     Status: None   Collection Time: 10/07/20  2:06 PM   Specimen: Nasopharyngeal Swab; Nasopharyngeal(NP) swabs in vial transport medium   Result Value Ref Range Status   SARS Coronavirus 2 by RT PCR NEGATIVE NEGATIVE Final    Comment: (NOTE) SARS-CoV-2 target nucleic acids are NOT DETECTED.  The SARS-CoV-2 RNA is generally detectable in upper respiratory specimens during the acute phase of infection. The lowest concentration of SARS-CoV-2 viral copies this assay can detect is 138 copies/mL. A negative result does not preclude SARS-Cov-2 infection and should not be used as the sole basis for treatment or other patient management decisions. A negative result may occur with  improper specimen collection/handling, submission of specimen other than  nasopharyngeal swab, presence of viral mutation(s) within the areas targeted by this assay, and inadequate number of viral copies(<138 copies/mL). A negative result must be combined with clinical observations, patient history, and epidemiological information. The expected result is Negative.  Fact Sheet for Patients:  EntrepreneurPulse.com.au  Fact Sheet for Healthcare Providers:  IncredibleEmployment.be  This test is no t yet approved or cleared by the [place] FDA and  has been authorized for detection and/or diagnosis of SARS-CoV-2 by FDA under an Emergency Use Authorization (EUA). This EUA will remain  in effect (meaning this test can be used) for the duration of the COVID-19 declaration under Section 564(b)(1) of the Act, 21 U.S.C.section 360bbb-3(b)(1), unless the authorization is terminated  or revoked sooner.       Influenza A by PCR NEGATIVE NEGATIVE Final   Influenza B by PCR NEGATIVE NEGATIVE Final    Comment: (NOTE) The Xpert Xpress SARS-CoV-2/FLU/RSV plus assay is intended as an aid in the diagnosis of influenza from Nasopharyngeal swab specimens and should not be used as a sole basis for treatment. Nasal washings and aspirates are unacceptable for Xpert Xpress SARS-CoV-2/FLU/RSV testing.  Fact Sheet for  Patients: EntrepreneurPulse.com.au  Fact Sheet for Healthcare Providers: IncredibleEmployment.be  This test is not yet approved or cleared by the [place] FDA and has been authorized for detection and/or diagnosis of SARS-CoV-2 by FDA under an Emergency Use Authorization (EUA). This EUA will remain in effect (meaning this test can be used) for the duration of the COVID-19 declaration under Section 564(b)(1) of the Act, 21 U.S.C. section 360bbb-3(b)(1), unless the authorization is terminated or revoked.  Performed at [facility], [place] [address], [place], [place] [address]   MRSA PCR Screening     Status: None   Collection Time: 10/07/20 11:42 PM   Specimen: Nasal Mucosa; Nasopharyngeal  Result Value Ref Range Status   MRSA by PCR NEGATIVE NEGATIVE Final    Comment:        The GeneXpert MRSA Assay (FDA approved for NASAL specimens only), is one component of a comprehensive MRSA colonization surveillance program. It is not intended to diagnose MRSA infection nor to guide or monitor treatment for MRSA infections. Performed at [facility], [place] [address] [place], [place] [address]      Radiology Studies: No results found. DG Chest Port 1 View  Final Result      Scheduled Meds: . acetaminophen  1,000 mg Oral QHS  . apixaban  5 mg Oral BID  . Chlorhexidine Gluconate Cloth  6 each Topical Daily  . dofetilide  125 mcg Oral BID  . irbesartan  150 mg Oral Daily  . mouth rinse  15 mL Mouth Rinse BID  . pantoprazole  40 mg Oral Daily  . polyethylene glycol  17 g Oral Daily  . rosuvastatin  40 mg Oral QHS  . senna-docusate  1 tablet Oral BID  . spironolactone  25 mg Oral Daily   PRN Meds: acetaminophen, lip balm, methocarbamol Continuous Infusions:   LOS: 2 days  Time spent: Greater than 50% of the 35 minute visit was spent in counseling/coordination of care for the patient as laid out in  the A&P.   Dwyane Dee, MD [facility] 10/10/2020, 2:20 PM

## 2020-10-10 NOTE — Plan of Care (Signed)
  Problem: Education: Goal: Knowledge of General Education information will improve Description: Including pain rating scale, medication(s)/side effects and non-pharmacologic comfort measures Outcome: Progressing   Problem: Clinical Measurements: Goal: Respiratory complications will improve Outcome: Progressing Goal: Cardiovascular complication will be avoided Outcome: Progressing   Problem: Activity: Goal: Risk for activity intolerance will decrease Outcome: Progressing   Problem: Nutrition: Goal: Adequate nutrition will be maintained Outcome: Progressing   Problem: Coping: Goal: Level of anxiety will decrease Outcome: Progressing   Problem: Elimination: Goal: Will not experience complications related to urinary retention Outcome: Progressing   Problem: Pain Managment: Goal: General experience of comfort will improve Outcome: Progressing   Problem: Safety: Goal: Ability to remain free from injury will improve Outcome: Progressing   Problem: Skin Integrity: Goal: Risk for impaired skin integrity will decrease Outcome: Progressing   Problem: Education: Goal: Knowledge of disease or condition will improve Outcome: Progressing Goal: Understanding of medication regimen will improve Outcome: Progressing   Problem: Activity: Goal: Ability to tolerate increased activity will improve Outcome: Progressing

## 2020-10-10 NOTE — Progress Notes (Addendum)
Progress Note  Patient Name: Barbara [name] Date of Encounter: 10/10/2020  [facility] Cardiologist: Shelva Majestic, MD   Subjective   No dizziness, blurred vision or feeling of passing out. Mid back pain, but no chest pain  Inpatient Medications    Scheduled Meds: . acetaminophen  1,000 mg Oral QHS  . aspirin EC  81 mg Oral Daily  . Chlorhexidine Gluconate Cloth  6 each Topical Daily  . dofetilide  125 mcg Oral BID  . irbesartan  150 mg Oral Daily  . mouth rinse  15 mL Mouth Rinse BID  . pantoprazole  40 mg Oral Daily  . rosuvastatin  40 mg Oral QHS  . spironolactone  25 mg Oral Daily   Continuous Infusions: . heparin 700 Units/hr (10/09/20 0222)   PRN Meds: acetaminophen, lip balm, methocarbamol   Vital Signs    Vitals:   10/09/20 2300 10/10/20 0003 10/10/20 0500 10/10/20 0637  BP: (!) 155/47 (!) 158/63  (!) 167/57  Pulse: (!) 51 (!) 55  (!) 56  Resp: (!) 24 16  13   Temp: 98.7 F (37.1 C)   98 F (36.7 C)  TempSrc: Oral   Oral  SpO2: 98% 100%  98%  Weight:   77.1 kg   Height:   5\' 7"  (1.702 m)     Intake/Output Summary (Last 24 hours) at 10/10/2020 0911 Last data filed at 10/10/2020 0600 Gross per 24 hour  Intake 391.47 ml  Output 1400 ml  Net -1008.53 ml   Last 3 Weights 10/10/2020 10/09/2020 10/08/2020  Weight (lbs) 169 lb 15.6 oz 169 lb 12.1 oz 169 lb 12.1 oz  Weight (kg) 77.1 kg 77 kg 77 kg      Telemetry    Sinus bradycardia - Personally Reviewed  ECG    Junctional rhythm with diffuse TWI  - Personally Reviewed  Physical Exam   GEN: No acute distress.   Neck: No JVD Cardiac: RRR, no murmurs, rubs, or gallops.  Respiratory: Clear to auscultation bilaterally. GI: Soft, nontender, non-distended  MS: No edema; No deformity. Neuro:  Nonfocal  Psych: Normal affect   Labs    High Sensitivity Troponin:   Recent Labs  Lab 10/07/20 1822 10/07/20 1949 10/07/20 2112 10/08/20 0920 10/08/20 1148  TROPONINIHS 639* 737* 763* 736*  623*      Chemistry Recent Labs  Lab 10/08/20 0920 10/09/20 0234 10/10/20 0440  NA 139 138 139  K 4.4 4.1 4.3  CL 111 108 106  CO2 20* 23 24  GLUCOSE 101* 135* 103*  BUN 27* 30* 28*  CREATININE 1.32* 1.52* 1.30*  CALCIUM 9.9 9.7 9.8  PROT 6.5 6.5  --   ALBUMIN 3.8 3.5  --   AST 27 22  --   ALT 15 16  --   ALKPHOS 57 66  --   BILITOT 0.7 0.7  --   GFRNONAA 41* 34* 41*  ANIONGAP 8 7 9      Hematology Recent Labs  Lab 10/08/20 1636 10/09/20 0234 10/10/20 0440  WBC 5.7 6.5 5.1  RBC 3.62* 3.68* 3.72*  HGB 10.8* 10.8* 10.9*  HCT 33.7* 34.1* 34.6*  MCV 93.1 92.7 93.0  MCH 29.8 29.3 29.3  MCHC 32.0 31.7 31.5  RDW 12.7 12.6 12.6  PLT 171 173 171    BNPNo results for input(s): BNP, PROBNP in the last 168 hours.   DDimer No results for input(s): DDIMER in the last 168 hours.   Radiology    ECHOCARDIOGRAM COMPLETE  Result Date: 10/08/2020    ECHOCARDIOGRAM REPORT   Patient Name:   Barbara [name] [facility] Date of Exam: 10/08/2020 Medical Rec #:  [id number]              Height:       66.0 in Accession #:    [id number]             Weight:       169.8 lb Date of Birth:  14-Dec-1940              BSA:          1.865 m Patient Age:    79 years               BP:           162/52 mmHg Patient Gender: F                      HR:           47 bpm. Exam Location:  Inpatient Procedure: 2D Echo Indications:    I21.4 NSTEMI  History:        Patient has prior history of Echocardiogram examinations, most                 recent 05/18/2020. CHF, CAD, Arrythmias:Atrial Fibrillation; Risk                 Factors:Hypertension, Dyslipidemia and Former Smoker. NSTEMI.  Sonographer:    Jannett Celestine RDCS (AE) Referring Phys: [id number] [place]  1. Left ventricular ejection fraction, by estimation, is 70 to 75%. The left ventricle has hyperdynamic function. The left ventricle has no regional wall motion abnormalities. There is moderate eccentric left ventricular hypertrophy of the apical  segment. Left ventricular diastolic parameters are indeterminate. Elevated left ventricular end-diastolic pressure.  2. Right ventricular systolic function is normal. The right ventricular size is normal. Tricuspid regurgitation signal is inadequate for assessing PA pressure.  3. The mitral valve is abnormal. Mild mitral valve regurgitation. Moderate mitral annular calcification.  4. The aortic valve is grossly normal. There is mild calcification of the aortic valve. There is mild thickening of the aortic valve. Aortic valve regurgitation is not visualized. Mild aortic valve sclerosis is present, with no evidence of aortic valve stenosis. Comparison(s): No significant change from prior study. Conclusion(s)/Recommendation(s): Otherwise normal echocardiogram, with minor abnormalities described in the report. Heart rate in the 40s on this study. Otherwise largely unchanged, apical hypertrophy confirmed with contrast imaging (noted previously). FINDINGS  Left Ventricle: Left ventricular ejection fraction, by estimation, is 70 to 75%. The left ventricle has hyperdynamic function. The left ventricle has no regional wall motion abnormalities. Definity contrast agent was given IV to delineate the left ventricular endocardial borders. The left ventricular internal cavity size was normal in size. There is moderate eccentric left ventricular hypertrophy of the apical segment. Left ventricular diastolic parameters are indeterminate. Elevated left ventricular end-diastolic pressure. Right Ventricle: The right ventricular size is normal. Right vetricular wall thickness was not well visualized. Right ventricular systolic function is normal. Tricuspid regurgitation signal is inadequate for assessing PA pressure. Left Atrium: Left atrial size was not well visualized. Right Atrium: Right atrial size was not well visualized. Pericardium: Trivial pericardial effusion is present. Mitral Valve: The mitral valve is abnormal. There is mild  thickening of the mitral valve leaflet(s). There is mild calcification of the mitral valve leaflet(s). Moderate mitral annular calcification. Mild mitral valve regurgitation. Tricuspid Valve:  The tricuspid valve is normal in structure. Tricuspid valve regurgitation is trivial. No evidence of tricuspid stenosis. Aortic Valve: The aortic valve is grossly normal. There is mild calcification of the aortic valve. There is mild thickening of the aortic valve. Aortic valve regurgitation is not visualized. Mild aortic valve sclerosis is present, with no evidence of aortic valve stenosis. Pulmonic Valve: The pulmonic valve was not well visualized. Pulmonic valve regurgitation is trivial. Aorta: The aortic root and ascending aorta are structurally normal, with no evidence of dilitation. Venous: The inferior vena cava was not well visualized. IAS/Shunts: The atrial septum is grossly normal.  LEFT VENTRICLE PLAX 2D LVIDd:         4.90 cm  Diastology LVIDs:         2.70 cm  LV e' medial:    5.11 cm/s LV PW:         0.90 cm  LV E/e' medial:  23.3 LV IVS:        1.10 cm  LV e' lateral:   7.07 cm/s LVOT diam:     1.90 cm  LV E/e' lateral: 16.8 LV SV:         89 LV SV Index:   48 LVOT Area:     2.84 cm  RIGHT VENTRICLE RV S prime:     10.80 cm/s TAPSE (M-mode): 1.8 cm LEFT ATRIUM         Index LA diam:    4.80 cm 2.57 cm/m  AORTIC VALVE LVOT Vmax:   142.00 cm/s LVOT Vmean:  95.200 cm/s LVOT VTI:    0.313 m  AORTA Ao Root diam: 2.60 cm MITRAL VALVE MV Area (PHT): 2.21 cm     SHUNTS MV Decel Time: 343 msec     Systemic VTI:  0.31 m MV E velocity: 119.00 cm/s  Systemic Diam: 1.90 cm Buford Dresser MD Electronically signed by Buford Dresser MD Signature Date/Time: 10/08/2020/1:36:02 PM    Final     Cardiac Studies   Cath 02/26/2019  Prox RCA to Mid RCA lesion is 15% stenosed.  Ost LAD to Prox LAD lesion is 20% stenosed.  Prox Cx lesion is 20% stenosed.  Mild non-obstructive coronary artery disease with a  patent proximal LAD stent with mild 20% intimal hyperplasia (inserted 09/1999); 20% proximal left circumflex narrowing, and mild irregularity of 10 to 15% in the mid RCA.  Hyperdynamic LV function with an "Ace of Spade "configuration and near cavity obliteration in the mid to apical segment during systole. There is evidence for left ventricular hypertrophy. LVEDP is 16 mm.  RECOMMENDATION: Medical therapy with optimal blood pressure control, lipid management, and resumption of Eliquis tomorrow.   Echo 10/08/2020 1. Left ventricular ejection fraction, by estimation, is 70 to 75%. The  left ventricle has hyperdynamic function. The left ventricle has no  regional wall motion abnormalities. There is moderate eccentric left  ventricular hypertrophy of the apical  segment. Left ventricular diastolic parameters are indeterminate. Elevated  left ventricular end-diastolic pressure.  2. Right ventricular systolic function is normal. The right ventricular  size is normal. Tricuspid regurgitation signal is inadequate for assessing  PA pressure.  3. The mitral valve is abnormal. Mild mitral valve regurgitation.  Moderate mitral annular calcification.  4. The aortic valve is grossly normal. There is mild calcification of the  aortic valve. There is mild thickening of the aortic valve. Aortic valve  regurgitation is not visualized. Mild aortic valve sclerosis is present,  with no evidence of aortic valve  stenosis.   Comparison(s): No significant change from prior study.    Patient Profile     79 y.o. female with PMH of CAD, HTN, HLD, OSA, PAF on Eliquis and h/o bradycardia presented with afib with RVR, given coreg and IV diltiazem on top of home tikosyn, patient subsequently went into junctional rhythm. Now back in sinus bradycardia with HR 45-low 50s. Trop trended up to 600 without chest pain. Tikosyn restarted on 12/6  Assessment & Plan    1. Junctional bradycardia             -  occurred in the setting when trying to rate control the patient by adding coreg and diltiazem on top of tikosyn, all AV nodal agents have been discontinued             - Tikosyn restarted on 12/6. HR overnight in the 40s with occasional HR in the low 50s even when patient was awake. Will discuss with MD to see if patient need a EP consult              2. PAF with RVR:              -since her last documented afib was in Jan 2019, we likely will continue with previous rhythm control using tikosyn without any AV nodal blocking agent             -patient received a PM dose of tikosyn on 12/4, but no tikosyn on 12/5, Tikosyn restarted on 12/6.    3. Elevated troponin: minimal CAD on cath in Apr 2020, given lack of chest pain, likely demand ischemia in the setting of severe junctional rhythm and afib             - Echo showed normal EF without WMA             - may consider stop aspirin if felt no ACS, and switch IV heparin back to home Eliquis  4. CAD: minimal nonobstructive CAD in Apr 2020  5. HTN  6. HLD  For questions or updates, please contact [place] Please consult [email or web address] for contact info under  Signed, Almyra Deforest, [place]  10/10/2020, 9:11 AM   As above, patient seen and examined.  She denies dyspnea, chest pain or syncope.  Telemetry reviewed.  She is bradycardic at times with heart rate in the 30s but it is a sinus bradycardia with no symptoms.  I therefore feel that pacemaker is not indicated.  We will avoid AV nodal blocking agents.  Her Tikosyn was resumed yesterday.  Continue to follow potassium and magnesium which are normal today.  Check electrocardiogram for QT interval and follow on telemetry.  If her episodes of atrial fibrillation are infrequent then continuing Tikosyn is likely best option.  If they become more frequent then may need to consider alternatives such as amiodarone which would likely require pacemaker prior to initiating.  Potential discharge tomorrow if QT  stable and no further arrhythmias.  We will discontinue IV heparin and resume apixaban 5 mg twice daily.  Her minimally elevated troponin is felt secondary to demand ischemia in the setting of atrial fibrillation.  Kirk Ruths

## 2020-10-11 ENCOUNTER — Other Ambulatory Visit: Payer: Self-pay

## 2020-10-11 DIAGNOSIS — I4891 Unspecified atrial fibrillation: Secondary | ICD-10-CM | POA: Diagnosis not present

## 2020-10-11 LAB — CBC WITH DIFFERENTIAL/PLATELET
Abs Immature Granulocytes: 0.02 10*3/uL (ref 0.00–0.07)
Basophils Absolute: 0 10*3/uL (ref 0.0–0.1)
Basophils Relative: 0 %
Eosinophils Absolute: 0.3 10*3/uL (ref 0.0–0.5)
Eosinophils Relative: 5 %
HCT: 36.8 % (ref 36.0–46.0)
Hemoglobin: 11.7 g/dL — ABNORMAL LOW (ref 12.0–15.0)
Immature Granulocytes: 0 %
Lymphocytes Relative: 23 %
Lymphs Abs: 1.2 10*3/uL (ref 0.7–4.0)
MCH: 29.5 pg (ref 26.0–34.0)
MCHC: 31.8 g/dL (ref 30.0–36.0)
MCV: 92.9 fL (ref 80.0–100.0)
Monocytes Absolute: 0.5 10*3/uL (ref 0.1–1.0)
Monocytes Relative: 10 %
Neutro Abs: 3.1 10*3/uL (ref 1.7–7.7)
Neutrophils Relative %: 62 %
Platelets: 183 10*3/uL (ref 150–400)
RBC: 3.96 MIL/uL (ref 3.87–5.11)
RDW: 12.5 % (ref 11.5–15.5)
WBC: 5 10*3/uL (ref 4.0–10.5)
nRBC: 0 % (ref 0.0–0.2)

## 2020-10-11 LAB — BASIC METABOLIC PANEL
Anion gap: 9 (ref 5–15)
BUN: 24 mg/dL — ABNORMAL HIGH (ref 8–23)
CO2: 23 mmol/L (ref 22–32)
Calcium: 9.9 mg/dL (ref 8.9–10.3)
Chloride: 106 mmol/L (ref 98–111)
Creatinine, Ser: 1.36 mg/dL — ABNORMAL HIGH (ref 0.44–1.00)
GFR, Estimated: 39 mL/min — ABNORMAL LOW (ref >60)
Glucose, Bld: 101 mg/dL — ABNORMAL HIGH (ref 70–99)
Potassium: 4.4 mmol/L (ref 3.5–5.1)
Sodium: 138 mmol/L (ref 135–145)

## 2020-10-11 LAB — MAGNESIUM: Magnesium: 2 mg/dL (ref 1.7–2.4)

## 2020-10-11 MED ORDER — AMLODIPINE BESYLATE 5 MG PO TABS
2.5000 mg | ORAL_TABLET | Freq: Every day | ORAL | Status: DC
  Administered 2020-10-11: 2.5 mg via ORAL
  Filled 2020-10-11: qty 1

## 2020-10-11 MED ORDER — SALINE SPRAY 0.65 % NA SOLN
1.0000 | NASAL | Status: DC | PRN
  Administered 2020-10-11: 1 via NASAL
  Filled 2020-10-11: qty 44

## 2020-10-11 MED ORDER — AYR SALINE NASAL NA GEL: 1.0000 "application " | NASAL | Status: DC | PRN

## 2020-10-11 NOTE — Progress Notes (Addendum)
Progress Note  Patient Name: Barbara [name] Date of Encounter: 10/11/2020  [facility] Cardiologist: Shelva Majestic, MD   Subjective   Feeling fine this morning. No complaints of chest pain or SOB. No complaints of palpitations.   Inpatient Medications    Scheduled Meds: . acetaminophen  1,000 mg Oral QHS  . apixaban  5 mg Oral BID  . Chlorhexidine Gluconate Cloth  6 each Topical Daily  . dofetilide  125 mcg Oral BID  . irbesartan  150 mg Oral Daily  . mouth rinse  15 mL Mouth Rinse BID  . pantoprazole  40 mg Oral Daily  . polyethylene glycol  17 g Oral Daily  . rosuvastatin  40 mg Oral QHS  . senna-docusate  1 tablet Oral BID  . spironolactone  25 mg Oral Daily   Continuous Infusions:  PRN Meds: acetaminophen, lip balm, methocarbamol, sodium chloride   Vital Signs    Vitals:   10/10/20 1226 10/10/20 2153 10/11/20 0442 10/11/20 0520  BP: (!) 154/51 (!) 151/51  (!) 137/58  Pulse: 60 (!) 58  (!) 50  Resp: 20 17  18   Temp: 97.8 F (36.6 C) 98 F (36.7 C)  98.1 F (36.7 C)  TempSrc: Oral Oral  Oral  SpO2: 99% 98%  99%  Weight:   76.6 kg   Height:        Intake/Output Summary (Last 24 hours) at 10/11/2020 0952 Last data filed at 10/11/2020 0645 Gross per 24 hour  Intake 480 ml  Output 1025 ml  Net -545 ml   Last 3 Weights 10/11/2020 10/10/2020 10/09/2020  Weight (lbs) 168 lb 14 oz 169 lb 15.6 oz 169 lb 12.1 oz  Weight (kg) 76.6 kg 77.1 kg 77 kg      Telemetry    Sinus rhythm/sinus bradycardia - Personally Reviewed  ECG    Await AM EKG - Personally Reviewed  Physical Exam   GEN: No acute distress.   Neck: No JVD Cardiac: RRR.  Respiratory: speaking in full sentences without SOB. MS: No edema Neuro:  Nonfocal  Psych: Normal affect   Labs    High Sensitivity Troponin:   Recent Labs  Lab 10/07/20 1822 10/07/20 1949 10/07/20 2112 10/08/20 0920 10/08/20 1148  TROPONINIHS 639* 737* 763* 736* 623*      Chemistry Recent Labs  Lab  10/08/20 0920 10/08/20 0920 10/09/20 0234 10/10/20 0440 10/11/20 0449  NA 139   < > 138 139 138  K 4.4   < > 4.1 4.3 4.4  CL 111   < > 108 106 106  CO2 20*   < > 23 24 23   GLUCOSE 101*   < > 135* 103* 101*  BUN 27*   < > 30* 28* 24*  CREATININE 1.32*   < > 1.52* 1.30* 1.36*  CALCIUM 9.9   < > 9.7 9.8 9.9  PROT 6.5  --  6.5  --   --   ALBUMIN 3.8  --  3.5  --   --   AST 27  --  22  --   --   ALT 15  --  16  --   --   ALKPHOS 57  --  66  --   --   BILITOT 0.7  --  0.7  --   --   GFRNONAA 41*   < > 34* 41* 39*  ANIONGAP 8   < > 7 9 9    < > = values in this interval  not displayed.     Hematology Recent Labs  Lab 10/09/20 0234 10/10/20 0440 10/11/20 0449  WBC 6.5 5.1 5.0  RBC 3.68* 3.72* 3.96  HGB 10.8* 10.9* 11.7*  HCT 34.1* 34.6* 36.8  MCV 92.7 93.0 92.9  MCH 29.3 29.3 29.5  MCHC 31.7 31.5 31.8  RDW 12.6 12.6 12.5  PLT 173 171 183    BNPNo results for input(s): BNP, PROBNP in the last 168 hours.   DDimer No results for input(s): DDIMER in the last 168 hours.   Radiology    No results found.  Cardiac Studies   Cath 02/26/2019  Prox RCA to Mid RCA lesion is 15% stenosed.  Ost LAD to Prox LAD lesion is 20% stenosed.  Prox Cx lesion is 20% stenosed.  Mild non-obstructive coronary artery disease with a patent proximal LAD stent with mild 20% intimal hyperplasia (inserted 09/1999); 20% proximal left circumflex narrowing, and mild irregularity of 10 to 15% in the mid RCA.  Hyperdynamic LV function with an "Ace of Spade "configuration and near cavity obliteration in the mid to apical segment during systole. There is evidence for left ventricular hypertrophy. LVEDP is 16 mm.  RECOMMENDATION: Medical therapy with optimal blood pressure control, lipid management, and resumption of Eliquis tomorrow.   Echo 10/08/2020 1. Left ventricular ejection fraction, by estimation, is 70 to 75%. The  left ventricle has hyperdynamic function. The left ventricle has no   regional wall motion abnormalities. There is moderate eccentric left  ventricular hypertrophy of the apical  segment. Left ventricular diastolic parameters are indeterminate. Elevated  left ventricular end-diastolic pressure.  2. Right ventricular systolic function is normal. The right ventricular  size is normal. Tricuspid regurgitation signal is inadequate for assessing  PA pressure.  3. The mitral valve is abnormal. Mild mitral valve regurgitation.  Moderate mitral annular calcification.  4. The aortic valve is grossly normal. There is mild calcification of the  aortic valve. There is mild thickening of the aortic valve. Aortic valve  regurgitation is not visualized. Mild aortic valve sclerosis is present,  with no evidence of aortic valve  stenosis.   Comparison(s): No significant change from prior study.   Patient Profile     79 y.o. female with PMH of CAD, HTN, HLD, OSA, PAF on Eliquis and h/o bradycardia presented with afib with RVR, given coreg and IV diltiazem on top of home tikosyn, patient subsequently went into junctional rhythm. Now back in sinus bradycardia with HR 45-low 50s. Trop trended up to 600 without chest pain. Tikosyn restarted on 12/6  Assessment & Plan    1. Paroxysmal atrial fibrillation with RVR and also intermittent slow ventricular rate/junctional bradycardia: patient presented with recurrent atrial fibrillation. Prior to this admission her last episode was 11/2017. She was on tikosyn prior to admission, however this was briefly held after she developed bradycardia/junctional rhythm after receiving diltiazem and metoprolol in the ED. Tikosyn was restarted 10/09/20, though she missed >2 doses, therefore will need tikosyn load. Await AM EKG for Qtc monitoring. HR generally in the 50s-60s while awake and down to 40s when sleeping. Home apixaban restarted yesterday given no plans for further procedures this admission.  - Await AM EKG for QTc monitoring. She will  need to complete 5 doses with stable QTc prior to discharge. We discussed that she will need to stay one more night to ensure no problems with tikosyn load.  - Continue tikosyn  2. Elevated troponin in patient with minimal CAD noted on cath 02/2019: HsTrop  peaked at 763 and trended down. Suspected to be demand ischemia in the setting of atrial fibrillation with RVR. Echo this admission with EF 70-75% and no RWMA. Not on BBlocker given baseline bradycardia - Do not anticipate further inpatient cardiac testing at this time.  - Continue crestor  3. HTN: BP is generally above goal  - Continue ARB and spironolactone - Will add low dose amlodipine for improved BP control  4. HLD: LDL 50 10/08/20 - Continue crestor      For questions or updates, please contact [place] Please consult [email or web address] for contact info under   Signed, Abigail Butts, PA-C  10/11/2020, 9:52 AM   As above, patient seen and examined. She remains in sinus rhythm. Tikosyn is being reloaded. She will receive her fifth dose this evening. If she remains stable on telemetry and QTc unchanged tomorrow morning she can be discharged.  Kirk Ruths, MD

## 2020-10-11 NOTE — Progress Notes (Signed)
QTC 470 per telemetry tech prior to Tikosyn administration

## 2020-10-11 NOTE — Progress Notes (Signed)
PROGRESS NOTE    Barbara [name]   BTD:[id number]  DOB: [birth year]  DOA: 10/07/2020     3  PCP: Laurey Morale, MD  CC: Palpitations, chest pain  Hospital Course: Barbara [name] is an 79 y.o. female who has CAD. Patient presented to the hospital after developing palpitations when trying to assist her husband at home after he had fallen on the floor.  She was also having some chest pain.  EMS brought patient to the hospital and she was found to be in A. fib with RVR. She was started on diltiazem, Coreg, and continued on home Tikosyn.  She then became bradycardic with intermittent junctional rhythms.  All AV nodal agents were stopped. She also developed elevated troponin which trended up and was started on a heparin drip for possible ACS.  Cardiology was consulted.  Echo was performed and showed normal EF with no wall motion abnormalities.  Elevated troponins were considered demand ischemia in setting of her RVR initially on admission. She was resumed back on home Tikosyn on 10/09/2020.   Interval History:  No events overnight. Seen by cardiology this am; plan for tentative home tomorrow.  Denies CP, SOB, palpitations.   Old records reviewed in assessment of this patient  ROS: Constitutional: negative for chills and fevers, Respiratory: negative for cough, Cardiovascular: negative for chest pain and Gastrointestinal: negative for abdominal pain  Assessment & Plan: Atrial fibrillation with RVR ([place]) - presented with CP, found to be in a fib RVR w/ elevated trp -Now rate controlled. Home Tikosyn has been resumed per cardiology -Heparin drip discontinued and home Eliquis also resumed -Plan is for Tikosyn loading per cardiology. She will undergo 1 more EKG on morning of 10/12/2020 and if stable QTc, will be able to discharge home  Bradycardia - cardiology following - tikosyn resumed 12/6 - hold AV nodal blocking agents -She remains asymptomatic. No indication for pacemaker at  this time per cardiology -Repeat EKG in a.m.  Persistent atrial fibrillation (HCC) - see afib with RVR  CAD (coronary artery disease) - demand ischemia - continue current regimen  Demand ischemia (HCC)-resolved as of 10/10/2020 - heparin drip transitioned back to Eliquis - no CP at this time  CKD (chronic kidney disease), stage III ([place]) - currently at baseline - avoid nephro toxins as able  Gastroesophageal reflux disease -Continue PPI  Hyperlipidemia LDL goal <70 -Continue statin  Essential hypertension -Continue Avapro and Aldactone   Metabolic acidosis-resolved as of 10/09/2020 -Mild with non-anion gap   Antimicrobials:   DVT prophylaxis: Heparin drip now back on Eliquis Code Status: Full Family Communication: None present Disposition Plan: Status is: Inpatient  Remains inpatient appropriate because:Ongoing diagnostic testing needed not appropriate for outpatient work up, IV treatments appropriate due to intensity of illness or inability to take PO and Inpatient level of care appropriate due to severity of illness   Dispo: The patient is from: Home              Anticipated d/c is to: Home              Anticipated d/c date is: 1 day              Patient currently is not medically stable to d/c.  Objective: Blood pressure (!) 153/45, pulse (!) 51, temperature 98.2 F (36.8 C), temperature source Oral, resp. rate 19, height 5\' 7"  (1.702 m), weight 76.6 kg, SpO2 100 %.  Examination: General appearance: alert, cooperative and no distress Head: Normocephalic, without  obvious abnormality, atraumatic Eyes: EOMI Lungs: clear to auscultation bilaterally Heart: irregularly irregular rhythm, S1, S2 normal and bradycardic. 3/6 HSM in USBs Abdomen: normal findings: bowel sounds normal and soft, non-tender Extremities: no edema Skin: mobility and turgor normal Neurologic: Grossly normal  Consultants:   Cardiology  Procedures:     Data Reviewed: I have  personally reviewed following labs and imaging studies Results for orders placed or performed during the hospital encounter of 10/07/20 (from the past 24 hour(s))  Basic metabolic panel     Status: Abnormal   Collection Time: 10/11/20  4:49 AM  Result Value Ref Range   Sodium 138 135 - 145 mmol/L   Potassium 4.4 3.5 - 5.1 mmol/L   Chloride 106 98 - 111 mmol/L   CO2 23 22 - 32 mmol/L   Glucose, Bld 101 (H) 70 - 99 mg/dL   BUN 24 (H) 8 - 23 mg/dL   Creatinine, Ser 1.36 (H) 0.44 - 1.00 mg/dL   Calcium 9.9 8.9 - 10.3 mg/dL   GFR, Estimated 39 (L) >60 mL/min   Anion gap 9 5 - 15  CBC with Differential/Platelet     Status: Abnormal   Collection Time: 10/11/20  4:49 AM  Result Value Ref Range   WBC 5.0 4.0 - 10.5 K/uL   RBC 3.96 3.87 - 5.11 MIL/uL   Hemoglobin 11.7 (L) 12.0 - 15.0 g/dL   HCT 36.8 36 - 46 %   MCV 92.9 80.0 - 100.0 fL   MCH 29.5 26.0 - 34.0 pg   MCHC 31.8 30.0 - 36.0 g/dL   RDW 12.5 11.5 - 15.5 %   Platelets 183 150 - 400 K/uL   nRBC 0.0 0.0 - 0.2 %   Neutrophils Relative % 62 %   Neutro Abs 3.1 1.7 - 7.7 K/uL   Lymphocytes Relative 23 %   Lymphs Abs 1.2 0.7 - 4.0 K/uL   Monocytes Relative 10 %   Monocytes Absolute 0.5 0.1 - 1.0 K/uL   Eosinophils Relative 5 %   Eosinophils Absolute 0.3 0.0 - 0.5 K/uL   Basophils Relative 0 %   Basophils Absolute 0.0 0.0 - 0.1 K/uL   Immature Granulocytes 0 %   Abs Immature Granulocytes 0.02 0.00 - 0.07 K/uL  Magnesium     Status: None   Collection Time: 10/11/20  4:49 AM  Result Value Ref Range   Magnesium 2.0 1.7 - 2.4 mg/dL    Recent Results (from the past 240 hour(s))  Resp Panel by RT-PCR (Flu A&B, Covid) Nasopharyngeal Swab     Status: None   Collection Time: 10/07/20  2:06 PM   Specimen: Nasopharyngeal Swab; Nasopharyngeal(NP) swabs in vial transport medium  Result Value Ref Range Status   SARS Coronavirus 2 by RT PCR NEGATIVE NEGATIVE Final    Comment: (NOTE) SARS-CoV-2 target nucleic acids are NOT DETECTED.  The  SARS-CoV-2 RNA is generally detectable in upper respiratory specimens during the acute phase of infection. The lowest concentration of SARS-CoV-2 viral copies this assay can detect is 138 copies/mL. A negative result does not preclude SARS-Cov-2 infection and should not be used as the sole basis for treatment or other patient management decisions. A negative result may occur with  improper specimen collection/handling, submission of specimen other than nasopharyngeal swab, presence of viral mutation(s) within the areas targeted by this assay, and inadequate number of viral copies(<138 copies/mL). A negative result must be combined with clinical observations, patient history, and epidemiological information. The expected result is Negative.  Fact Sheet for Patients:  EntrepreneurPulse.com.au  Fact Sheet for Healthcare Providers:  IncredibleEmployment.be  This test is no t yet approved or cleared by the [place] FDA and  has been authorized for detection and/or diagnosis of SARS-CoV-2 by FDA under an Emergency Use Authorization (EUA). This EUA will remain  in effect (meaning this test can be used) for the duration of the COVID-19 declaration under Section 564(b)(1) of the Act, 21 U.S.C.section 360bbb-3(b)(1), unless the authorization is terminated  or revoked sooner.       Influenza A by PCR NEGATIVE NEGATIVE Final   Influenza B by PCR NEGATIVE NEGATIVE Final    Comment: (NOTE) The Xpert Xpress SARS-CoV-2/FLU/RSV plus assay is intended as an aid in the diagnosis of influenza from Nasopharyngeal swab specimens and should not be used as a sole basis for treatment. Nasal washings and aspirates are unacceptable for Xpert Xpress SARS-CoV-2/FLU/RSV testing.  Fact Sheet for Patients: EntrepreneurPulse.com.au  Fact Sheet for Healthcare Providers: IncredibleEmployment.be  This test is not yet approved or  cleared by the [place] FDA and has been authorized for detection and/or diagnosis of SARS-CoV-2 by FDA under an Emergency Use Authorization (EUA). This EUA will remain in effect (meaning this test can be used) for the duration of the COVID-19 declaration under Section 564(b)(1) of the Act, 21 U.S.C. section 360bbb-3(b)(1), unless the authorization is terminated or revoked.  Performed at [facility], [place] [address], [place], [place] [address]   MRSA PCR Screening     Status: None   Collection Time: 10/07/20 11:42 PM   Specimen: Nasal Mucosa; Nasopharyngeal  Result Value Ref Range Status   MRSA by PCR NEGATIVE NEGATIVE Final    Comment:        The GeneXpert MRSA Assay (FDA approved for NASAL specimens only), is one component of a comprehensive MRSA colonization surveillance program. It is not intended to diagnose MRSA infection nor to guide or monitor treatment for MRSA infections. Performed at [facility], [place] [address] [place], [place] [address]      Radiology Studies: No results found. DG Chest Port 1 View  Final Result      Scheduled Meds: . acetaminophen  1,000 mg Oral QHS  . amLODipine  2.5 mg Oral Daily  . apixaban  5 mg Oral BID  . Chlorhexidine Gluconate Cloth  6 each Topical Daily  . dofetilide  125 mcg Oral BID  . irbesartan  150 mg Oral Daily  . mouth rinse  15 mL Mouth Rinse BID  . pantoprazole  40 mg Oral Daily  . polyethylene glycol  17 g Oral Daily  . rosuvastatin  40 mg Oral QHS  . senna-docusate  1 tablet Oral BID  . spironolactone  25 mg Oral Daily   PRN Meds: acetaminophen, lip balm, methocarbamol, sodium chloride Continuous Infusions:   LOS: 3 days  Time spent: Greater than 50% of the 35 minute visit was spent in counseling/coordination of care for the patient as laid out in the A&P.   Dwyane Dee, MD [facility] 10/11/2020, 2:43 PM

## 2020-10-11 NOTE — Progress Notes (Signed)
Contacted on call attending practitioner secondary to patient requesting nasal spray for nostril congestion. Patient takes at home as needed. Order given and carried out.

## 2020-10-12 ENCOUNTER — Other Ambulatory Visit: Payer: Self-pay

## 2020-10-12 LAB — CBC WITH DIFFERENTIAL/PLATELET
Abs Immature Granulocytes: 0.02 10*3/uL (ref 0.00–0.07)
Basophils Absolute: 0 10*3/uL (ref 0.0–0.1)
Basophils Relative: 0 %
Eosinophils Absolute: 0.3 10*3/uL (ref 0.0–0.5)
Eosinophils Relative: 5 %
HCT: 37.1 % (ref 36.0–46.0)
Hemoglobin: 11.7 g/dL — ABNORMAL LOW (ref 12.0–15.0)
Immature Granulocytes: 0 %
Lymphocytes Relative: 24 %
Lymphs Abs: 1.2 10*3/uL (ref 0.7–4.0)
MCH: 29.3 pg (ref 26.0–34.0)
MCHC: 31.5 g/dL (ref 30.0–36.0)
MCV: 92.8 fL (ref 80.0–100.0)
Monocytes Absolute: 0.6 10*3/uL (ref 0.1–1.0)
Monocytes Relative: 11 %
Neutro Abs: 3 10*3/uL (ref 1.7–7.7)
Neutrophils Relative %: 60 %
Platelets: 198 10*3/uL (ref 150–400)
RBC: 4 MIL/uL (ref 3.87–5.11)
RDW: 12.6 % (ref 11.5–15.5)
WBC: 5.1 10*3/uL (ref 4.0–10.5)
nRBC: 0 % (ref 0.0–0.2)

## 2020-10-12 LAB — BASIC METABOLIC PANEL
Anion gap: 8 (ref 5–15)
BUN: 21 mg/dL (ref 8–23)
CO2: 25 mmol/L (ref 22–32)
Calcium: 10.2 mg/dL (ref 8.9–10.3)
Chloride: 105 mmol/L (ref 98–111)
Creatinine, Ser: 1.3 mg/dL — ABNORMAL HIGH (ref 0.44–1.00)
GFR, Estimated: 41 mL/min — ABNORMAL LOW (ref >60)
Glucose, Bld: 105 mg/dL — ABNORMAL HIGH (ref 70–99)
Potassium: 4.4 mmol/L (ref 3.5–5.1)
Sodium: 138 mmol/L (ref 135–145)

## 2020-10-12 LAB — MAGNESIUM: Magnesium: 1.8 mg/dL (ref 1.7–2.4)

## 2020-10-12 MED ORDER — AMLODIPINE BESYLATE 5 MG PO TABS
5.0000 mg | ORAL_TABLET | Freq: Every day | ORAL | Status: DC
  Administered 2020-10-12: 5 mg via ORAL
  Filled 2020-10-12: qty 1

## 2020-10-12 MED ORDER — AMLODIPINE BESYLATE 5 MG PO TABS
5.0000 mg | ORAL_TABLET | Freq: Every day | ORAL | 3 refills | Status: DC
Start: 2020-10-13 — End: 2021-02-13

## 2020-10-12 MED ORDER — MAGNESIUM SULFATE 2 GM/50ML IV SOLN
2.0000 g | Freq: Once | INTRAVENOUS | Status: AC
  Administered 2020-10-12: 2 g via INTRAVENOUS
  Filled 2020-10-12: qty 50

## 2020-10-12 NOTE — Progress Notes (Signed)
Pharmacy: Tikosyn K 4.4  Mg 1.8 SCr 1.3  Plan: Mag 2 gm IVPB x1  Eudelia Bunch, Pharm.D 10/12/2020 7:13 AM

## 2020-10-12 NOTE — Progress Notes (Signed)
QTC measures 390 at 0840 this am per tele

## 2020-10-12 NOTE — Plan of Care (Signed)
  Problem: Education: Goal: Knowledge of General Education information will improve Description: Including pain rating scale, medication(s)/side effects and non-pharmacologic comfort measures Outcome: Progressing   Problem: Clinical Measurements: Goal: Will remain free from infection Outcome: Progressing Goal: Respiratory complications will improve Outcome: Progressing   Problem: Activity: Goal: Risk for activity intolerance will decrease Outcome: Progressing   Problem: Nutrition: Goal: Adequate nutrition will be maintained Outcome: Progressing   Problem: Coping: Goal: Level of anxiety will decrease Outcome: Progressing   Problem: Elimination: Goal: Will not experience complications related to bowel motility Outcome: Progressing Goal: Will not experience complications related to urinary retention Outcome: Progressing   Problem: Pain Managment: Goal: General experience of comfort will improve Outcome: Progressing   Problem: Safety: Goal: Ability to remain free from injury will improve Outcome: Progressing   Problem: Skin Integrity: Goal: Risk for impaired skin integrity will decrease Outcome: Progressing   Problem: Education: Goal: Knowledge of disease or condition will improve Outcome: Progressing   Problem: Activity: Goal: Ability to tolerate increased activity will improve Outcome: Progressing

## 2020-10-12 NOTE — Progress Notes (Signed)
Patient HR dropped into the mid 30s (non sustained) asymptomatic otherwise. Assessed patient. No complains of chest pain or discomfort. Made on coming nurse aware. Heart rate is currently in 50s.

## 2020-10-12 NOTE — Discharge Summary (Signed)
Physician Discharge Summary   Barbara [name] SWF:[id number] DOB: 1941-08-19 DOA: 10/07/2020  PCP: Laurey Morale, MD  Admit date: 10/07/2020 Discharge date: 10/12/2020  Admitted From: home Disposition:  home Discharging physician: Dwyane Dee, MD  Recommendations for Outpatient Follow-up:  1. Follow up with cardiology   Patient discharged to home in Discharge Condition: stable CODE STATUS: Full Diet recommendation:  Diet Orders (From admission, onward)    Start     Ordered   10/12/20 0000  Diet - low sodium heart healthy        10/12/20 1228   10/08/20 0930  Diet Heart Room service appropriate? Yes; Fluid consistency: Thin  Diet effective now       Question Answer Comment  Room service appropriate? Yes   Fluid consistency: Thin      10/08/20 0929          Hospital Course: Barbara [name] is an 79 y.o. female who has CAD. Patient presented to the hospital after developing palpitations when trying to assist her husband at home after he had fallen on the floor.  She was also having some chest pain.  EMS brought patient to the hospital and she was found to be in A. fib with RVR. She was started on diltiazem, Coreg, and continued on home Tikosyn.  She then became bradycardic with intermittent junctional rhythms.  All AV nodal agents were stopped. She also developed elevated troponin which trended up and was started on a heparin drip for possible ACS.  Cardiology was consulted.  Echo was performed and showed normal EF with no wall motion abnormalities.  Elevated troponins were considered demand ischemia in setting of her RVR initially on admission. She was resumed back on home Tikosyn on 10/09/2020.  She was monitored for 5 doses of Tikosyn per cardiology recommendation with serial EKGs.  QTC remained stable and she was able to be discharged home.   * Bradycardia - cardiology following - tikosyn resumed 12/6 - hold AV nodal blocking agents -She remains asymptomatic. No  indication for pacemaker at this time per cardiology  Atrial fibrillation with RVR ([place]) - presented with CP, found to be in a fib RVR w/ elevated trp -Now rate controlled. Home Tikosyn has been resumed per cardiology -Heparin drip discontinued and home Eliquis also resumed -Plan is for Tikosyn loading per cardiology. She was reloaded inpatient with stable QTc on serial EKGs  Persistent atrial fibrillation (HCC) - see afib with RVR  CAD (coronary artery disease) - demand ischemia - continue current regimen  Demand ischemia (HCC)-resolved as of 10/10/2020 - heparin drip transitioned back to Eliquis - no CP at this time  CKD (chronic kidney disease), stage III ([place]) - currently at baseline - avoid nephro toxins as able  Gastroesophageal reflux disease -Continue PPI  Hyperlipidemia LDL goal <70 -Continue statin  Essential hypertension -Continue Avapro and Aldactone   Metabolic acidosis-resolved as of 10/09/2020 -Mild with non-anion gap   The patient's chronic medical conditions were treated accordingly per the patient's home medication regimen except as noted.  On day of discharge, patient was felt deemed stable for discharge. Patient/family member advised to call PCP or come back to ER if needed.   Principal Diagnosis: Bradycardia  Discharge Diagnoses: Active Hospital Problems   Diagnosis Date Noted  . Bradycardia 12/27/2015    Priority: High  . Atrial fibrillation with RVR ([place]) 10/08/2020    Priority: High  . Persistent atrial fibrillation ([place]) 10/07/2020    Priority: Medium  . CAD (coronary  artery disease) 04/06/2007    Priority: Medium  . CKD (chronic kidney disease), stage III ([place]) 05/20/2020  . Gastroesophageal reflux disease 11/06/2017  . Hyperlipidemia LDL goal <70 09/13/2014  . Essential hypertension 04/06/2007    Resolved Hospital Problems   Diagnosis Date Noted Date Resolved  . Demand ischemia ([place])  10/10/2020    Priority: Low  . Metabolic  acidosis 69/67/8938 10/09/2020    Discharge Instructions    Diet - low sodium heart healthy   Complete by: As directed    Increase activity slowly   Complete by: As directed      Allergies as of 10/12/2020      Reactions   Lidocaine Anaphylaxis   Morphine Other (See Comments)   "Body shuts down"   Procaine Hcl Anaphylaxis, Rash, Other (See Comments)   "Anything with 'caine' in it "   Sulfonamide Derivatives Hives   Norco [hydrocodone-acetaminophen] Other (See Comments)   "Made me sick"   Tramadol Nausea Only   Vytorin [ezetimibe-simvastatin] Other (See Comments)   Unknown   Tape Itching, Other (See Comments)   Patient prefers either paper tape or Coban wrap      Medication List    STOP taking these medications   aspirin 81 MG EC tablet   methocarbamol 500 MG tablet Commonly known as: Robaxin     TAKE these medications   acetaminophen 500 MG tablet Commonly known as: TYLENOL Take 1,000 mg by mouth at bedtime.   [place]away 0.025 % ophthalmic solution Generic drug: ketotifen Place 1 drop into both eyes 2 (two) times daily as needed (eye irritation/allergy).   amLODipine 5 MG tablet Commonly known as: NORVASC Take 1 tablet (5 mg total) by mouth daily. Start taking on: October 13, 2020   amoxicillin 500 MG tablet Commonly known as: AMOXIL Take 2,000 mg by mouth See admin instructions. Take 4 tablets (2000 mg) by mouth one hour prior to dental appointments   candesartan 16 MG tablet Commonly known as: ATACAND Take 1 tablet (16 mg total) by mouth daily.   dextromethorphan 30 MG/5ML liquid Commonly known as: DELSYM Take 15 mg by mouth daily as needed for cough.   diclofenac Sodium 1 % Gel Commonly known as: VOLTAREN Apply 1 application topically 4 (four) times daily as needed (knee pain).   dofetilide 125 MCG capsule Commonly known as: TIKOSYN Take 1 capsule (125 mcg total) by mouth 2 (two) times daily.   Eliquis 5 MG Tabs tablet Generic drug: apixaban TAKE  1 TABLET TWICE A DAY What changed: how much to take   ipratropium 0.06 % nasal spray Commonly known as: ATROVENT Place 2 sprays into both nostrils daily as needed for rhinitis.   loratadine 10 MG tablet Commonly known as: CLARITIN Take 10 mg by mouth daily as needed (allergies/sinus issues).   multivitamin with minerals Tabs tablet Take 1 tablet by mouth daily.   nitroGLYCERIN 0.4 MG SL tablet Commonly known as: NITROSTAT Place 1 tablet (0.4 mg total) under the tongue every 5 (five) minutes x 3 doses as needed for chest pain.   pantoprazole 40 MG tablet Commonly known as: PROTONIX Take 1 tablet (40 mg total) by mouth daily.   PRESCRIPTION MEDICATION See admin instructions. CPAP- At bedtime   rosuvastatin 40 MG tablet Commonly known as: CRESTOR Take 1 tablet (40 mg total) by mouth daily. What changed: when to take this   spironolactone 25 MG tablet Commonly known as: ALDACTONE Take 1 tablet (25 mg total) by mouth daily.  triamcinolone 55 MCG/ACT nasal inhaler Commonly known as: NASACORT Place 1 spray into both nostrils daily as needed (for allergies).   Vista Gel Dry Eye Relief 0.3 % Gel ophthalmic ointment Generic drug: hypromellose Place 1 application into both eyes at bedtime as needed for dry eyes.   Vitamin D3 50 MCG (2000 UT) Tabs Take 2,000 Units by mouth daily.       Follow-up Information    Sherran Needs, NP Follow up on 10/20/2020.   Specialties: Nurse Practitioner, Cardiology Why: Please arrive 15 minutes early for your 10am post-hospital Afib clinic appointment Contact information: [place] [address] [phone]        Almyra Deforest, [place] Follow up on 11/09/2020.   Specialties: Cardiology, Radiology Why: Please arrive 15 minutes early for your 1:45pm cardiology appointment Contact information: [address] [place] [address] [phone]              Allergies  Allergen Reactions  . Lidocaine  Anaphylaxis  . Morphine Other (See Comments)    "Body shuts down"  . Procaine Hcl Anaphylaxis, Rash and Other (See Comments)    "Anything with 'caine' in it "  . Sulfonamide Derivatives Hives  . Norco [Hydrocodone-Acetaminophen] Other (See Comments)    "Made me sick"  . Tramadol Nausea Only  . Vytorin [Ezetimibe-Simvastatin] Other (See Comments)    Unknown  . Tape Itching and Other (See Comments)    Patient prefers either paper tape or Coban wrap    Consultations: Cardiology  Discharge Exam: BP (!) 137/48 (BP Location: Left Arm)   Pulse (!) 57   Temp 97.8 F (36.6 C) (Oral)   Resp 20   Ht 5\' 7"  (1.702 m)   Wt 76.4 kg   SpO2 96%   BMI 26.38 kg/m  General appearance:alert, cooperative and no distress Head:Normocephalic, without obvious abnormality, atraumatic Eyes:EOMI Lungs:clear to auscultation bilaterally Heart:irregularly irregular rhythm, S1, S2 normal andbradycardic. 3/6 HSM in USBs Abdomen:normal findings:bowel sounds normal and soft, non-tender Extremities:no edema Skin:mobility and turgor normal Neurologic:Grossly normal  The results of significant diagnostics from this hospitalization (including imaging, microbiology, ancillary and laboratory) are listed below for reference.   Microbiology: Recent Results (from the past 240 hour(s))  Resp Panel by RT-PCR (Flu A&B, Covid) Nasopharyngeal Swab     Status: None   Collection Time: 10/07/20  2:06 PM   Specimen: Nasopharyngeal Swab; Nasopharyngeal(NP) swabs in vial transport medium  Result Value Ref Range Status   SARS Coronavirus 2 by RT PCR NEGATIVE NEGATIVE Final    Comment: (NOTE) SARS-CoV-2 target nucleic acids are NOT DETECTED.  The SARS-CoV-2 RNA is generally detectable in upper respiratory specimens during the acute phase of infection. The lowest concentration of SARS-CoV-2 viral copies this assay can detect is 138 copies/mL. A negative result does not preclude SARS-Cov-2 infection and should  not be used as the sole basis for treatment or other patient management decisions. A negative result may occur with  improper specimen collection/handling, submission of specimen other than nasopharyngeal swab, presence of viral mutation(s) within the areas targeted by this assay, and inadequate number of viral copies(<138 copies/mL). A negative result must be combined with clinical observations, patient history, and epidemiological information. The expected result is Negative.  Fact Sheet for Patients:  EntrepreneurPulse.com.au  Fact Sheet for Healthcare Providers:  IncredibleEmployment.be  This test is no t yet approved or cleared by the [place] FDA and  has been authorized for detection and/or diagnosis of SARS-CoV-2 by  FDA under an Emergency Use Authorization (EUA). This EUA will remain  in effect (meaning this test can be used) for the duration of the COVID-19 declaration under Section 564(b)(1) of the Act, 21 U.S.C.section 360bbb-3(b)(1), unless the authorization is terminated  or revoked sooner.       Influenza A by PCR NEGATIVE NEGATIVE Final   Influenza B by PCR NEGATIVE NEGATIVE Final    Comment: (NOTE) The Xpert Xpress SARS-CoV-2/FLU/RSV plus assay is intended as an aid in the diagnosis of influenza from Nasopharyngeal swab specimens and should not be used as a sole basis for treatment. Nasal washings and aspirates are unacceptable for Xpert Xpress SARS-CoV-2/FLU/RSV testing.  Fact Sheet for Patients: EntrepreneurPulse.com.au  Fact Sheet for Healthcare Providers: IncredibleEmployment.be  This test is not yet approved or cleared by the [place] FDA and has been authorized for detection and/or diagnosis of SARS-CoV-2 by FDA under an Emergency Use Authorization (EUA). This EUA will remain in effect (meaning this test can be used) for the duration of the COVID-19 declaration under  Section 564(b)(1) of the Act, 21 U.S.C. section 360bbb-3(b)(1), unless the authorization is terminated or revoked.  Performed at [facility], [place] [address], [place], [place] [address]   MRSA PCR Screening     Status: None   Collection Time: 10/07/20 11:42 PM   Specimen: Nasal Mucosa; Nasopharyngeal  Result Value Ref Range Status   MRSA by PCR NEGATIVE NEGATIVE Final    Comment:        The GeneXpert MRSA Assay (FDA approved for NASAL specimens only), is one component of a comprehensive MRSA colonization surveillance program. It is not intended to diagnose MRSA infection nor to guide or monitor treatment for MRSA infections. Performed at [facility], [place] [address], [place], [place] [address]      Labs: BNP (last 3 results) No results for input(s): BNP in the last 8760 hours. Basic Metabolic Panel: Recent Labs  Lab 10/08/20 0313 10/08/20 0920 10/09/20 0234 10/10/20 0440 10/11/20 0449 10/12/20 0421  NA  --  139 138 139 138 138  K  --  4.4 4.1 4.3 4.4 4.4  CL  --  111 108 106 106 105  CO2  --  20* 23 24 23 25   GLUCOSE  --  101* 135* 103* 101* 105*  BUN  --  27* 30* 28* 24* 21  CREATININE  --  1.32* 1.52* 1.30* 1.36* 1.30*  CALCIUM  --  9.9 9.7 9.8 9.9 10.2  MG 1.9  --  2.2 2.1 2.0 1.8   Liver Function Tests: Recent Labs  Lab 10/08/20 0920 10/09/20 0234  AST 27 22  ALT 15 16  ALKPHOS 57 66  BILITOT 0.7 0.7  PROT 6.5 6.5  ALBUMIN 3.8 3.5   No results for input(s): LIPASE, AMYLASE in the last 168 hours. No results for input(s): AMMONIA in the last 168 hours. CBC: Recent Labs  Lab 10/08/20 1636 10/09/20 0234 10/10/20 0440 10/11/20 0449 10/12/20 0421  WBC 5.7 6.5 5.1 5.0 5.1  NEUTROABS  --   --  2.9 3.1 3.0  HGB 10.8* 10.8* 10.9* 11.7* 11.7*  HCT 33.7* 34.1* 34.6* 36.8 37.1  MCV 93.1 92.7 93.0 92.9 92.8  PLT 171 173 171 183 198   Cardiac Enzymes: No results for input(s): CKTOTAL, CKMB, CKMBINDEX, TROPONINI  in the last 168 hours. BNP: Invalid input(s): POCBNP CBG: No results for input(s): GLUCAP in the last 168 hours. D-Dimer No results for input(s): DDIMER in the  last 72 hours. Hgb A1c No results for input(s): HGBA1C in the last 72 hours. Lipid Profile No results for input(s): CHOL, HDL, LDLCALC, TRIG, CHOLHDL, LDLDIRECT in the last 72 hours. Thyroid function studies No results for input(s): TSH, T4TOTAL, T3FREE, THYROIDAB in the last 72 hours.  Invalid input(s): FREET3 Anemia work up No results for input(s): VITAMINB12, FOLATE, FERRITIN, TIBC, IRON, RETICCTPCT in the last 72 hours. Urinalysis    Component Value Date/Time   COLORURINE yellow 10/17/2010 0859   APPEARANCEUR Clear 10/17/2010 0859   LABSPEC 1.015 10/17/2010 0859   PHURINE 5.0 10/17/2010 0859   HGBUR negative 10/17/2010 0859   BILIRUBINUR N 11/06/2017 1131   PROTEINUR N 11/06/2017 1131   UROBILINOGEN 0.2 11/06/2017 1131   UROBILINOGEN 0.2 10/17/2010 0859   NITRITE N 11/06/2017 1131   NITRITE negative 10/17/2010 0859   LEUKOCYTESUR Negative 11/06/2017 1131   Sepsis Labs Invalid input(s): PROCALCITONIN,  WBC,  LACTICIDVEN Microbiology Recent Results (from the past 240 hour(s))  Resp Panel by RT-PCR (Flu A&B, Covid) Nasopharyngeal Swab     Status: None   Collection Time: 10/07/20  2:06 PM   Specimen: Nasopharyngeal Swab; Nasopharyngeal(NP) swabs in vial transport medium  Result Value Ref Range Status   SARS Coronavirus 2 by RT PCR NEGATIVE NEGATIVE Final    Comment: (NOTE) SARS-CoV-2 target nucleic acids are NOT DETECTED.  The SARS-CoV-2 RNA is generally detectable in upper respiratory specimens during the acute phase of infection. The lowest concentration of SARS-CoV-2 viral copies this assay can detect is 138 copies/mL. A negative result does not preclude SARS-Cov-2 infection and should not be used as the sole basis for treatment or other patient management decisions. A negative result may occur with   improper specimen collection/handling, submission of specimen other than nasopharyngeal swab, presence of viral mutation(s) within the areas targeted by this assay, and inadequate number of viral copies(<138 copies/mL). A negative result must be combined with clinical observations, patient history, and epidemiological information. The expected result is Negative.  Fact Sheet for Patients:  EntrepreneurPulse.com.au  Fact Sheet for Healthcare Providers:  IncredibleEmployment.be  This test is no t yet approved or cleared by the [place] FDA and  has been authorized for detection and/or diagnosis of SARS-CoV-2 by FDA under an Emergency Use Authorization (EUA). This EUA will remain  in effect (meaning this test can be used) for the duration of the COVID-19 declaration under Section 564(b)(1) of the Act, 21 U.S.C.section 360bbb-3(b)(1), unless the authorization is terminated  or revoked sooner.       Influenza A by PCR NEGATIVE NEGATIVE Final   Influenza B by PCR NEGATIVE NEGATIVE Final    Comment: (NOTE) The Xpert Xpress SARS-CoV-2/FLU/RSV plus assay is intended as an aid in the diagnosis of influenza from Nasopharyngeal swab specimens and should not be used as a sole basis for treatment. Nasal washings and aspirates are unacceptable for Xpert Xpress SARS-CoV-2/FLU/RSV testing.  Fact Sheet for Patients: EntrepreneurPulse.com.au  Fact Sheet for Healthcare Providers: IncredibleEmployment.be  This test is not yet approved or cleared by the [place] FDA and has been authorized for detection and/or diagnosis of SARS-CoV-2 by FDA under an Emergency Use Authorization (EUA). This EUA will remain in effect (meaning this test can be used) for the duration of the COVID-19 declaration under Section 564(b)(1) of the Act, 21 U.S.C. section 360bbb-3(b)(1), unless the authorization is terminated  or revoked.  Performed at [facility], [place] [address] [place], [place] [address]   MRSA PCR Screening  Status: None   Collection Time: 10/07/20 11:42 PM   Specimen: Nasal Mucosa; Nasopharyngeal  Result Value Ref Range Status   MRSA by PCR NEGATIVE NEGATIVE Final    Comment:        The GeneXpert MRSA Assay (FDA approved for NASAL specimens only), is one component of a comprehensive MRSA colonization surveillance program. It is not intended to diagnose MRSA infection nor to guide or monitor treatment for MRSA infections. Performed at [facility], [place] [address] [place], [place] [address]     Procedures/Studies: DG Chest Port 1 View  Result Date: 10/07/2020 CLINICAL DATA:  Chest pain beginning this morning. EXAM: PORTABLE CHEST 1 VIEW COMPARISON:  05/17/2020 FINDINGS: Cardiac silhouette is normal in size. No mediastinal or hilar masses or evidence of adenopathy. Mild linear opacity at the left lung base consistent with scarring or atelectasis, stable. Lungs otherwise clear. No convincing pleural effusion.  No pneumothorax. 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 No acute skeletal abnormality. IMPRESSION: No active disease. Electronically Signed   By: Lajean Manes M.D.   On: 10/07/2020 12:22   ECHOCARDIOGRAM COMPLETE  Result Date: 10/08/2020    ECHOCARDIOGRAM REPORT   Patient Name:   MARYRUTH APPLE [facility] Date of Exam: 10/08/2020 Medical Rec #:  [id number]              Height:       66.0 in Accession #:    [id number]             Weight:       169.8 lb Date of Birth:  11/17/1940              BSA:          1.865 m Patient Age:    79 years               BP:           162/52 mmHg Patient Gender: F                      HR:           47 bpm. Exam Location:  Inpatient Procedure: 2D Echo Indications:    I21.4 NSTEMI  History:        Patient has prior history of Echocardiogram examinations, most                 recent 05/18/2020. CHF,  CAD, Arrythmias:Atrial Fibrillation; Risk                 Factors:Hypertension, Dyslipidemia and Former Smoker. NSTEMI.  Sonographer:    Jannett Celestine RDCS (AE) Referring Phys: [id number] [place]  1. Left ventricular ejection fraction, by estimation, is 70 to 75%. The left ventricle has hyperdynamic function. The left ventricle has no regional wall motion abnormalities. There is moderate eccentric left ventricular hypertrophy of the apical segment. Left ventricular diastolic parameters are indeterminate. Elevated left ventricular end-diastolic pressure.  2. Right ventricular systolic function is normal. The right ventricular size is normal. Tricuspid regurgitation signal is inadequate for assessing PA pressure.  3. The mitral valve is abnormal. Mild mitral valve regurgitation. Moderate mitral annular calcification.  4. The aortic valve is grossly normal. There is mild calcification of the aortic valve. There is mild thickening of the aortic valve. Aortic valve regurgitation is not visualized. Mild aortic valve sclerosis is present, with no evidence of aortic valve stenosis. Comparison(s): No significant change from prior study. Conclusion(s)/Recommendation(s):  Otherwise normal echocardiogram, with minor abnormalities described in the report. Heart rate in the 40s on this study. Otherwise largely unchanged, apical hypertrophy confirmed with contrast imaging (noted previously). FINDINGS  Left Ventricle: Left ventricular ejection fraction, by estimation, is 70 to 75%. The left ventricle has hyperdynamic function. The left ventricle has no regional wall motion abnormalities. Definity contrast agent was given IV to delineate the left ventricular endocardial borders. The left ventricular internal cavity size was normal in size. There is moderate eccentric left ventricular hypertrophy of the apical segment. Left ventricular diastolic parameters are indeterminate. Elevated left ventricular end-diastolic  pressure. Right Ventricle: The right ventricular size is normal. Right vetricular wall thickness was not well visualized. Right ventricular systolic function is normal. Tricuspid regurgitation signal is inadequate for assessing PA pressure. Left Atrium: Left atrial size was not well visualized. Right Atrium: Right atrial size was not well visualized. Pericardium: Trivial pericardial effusion is present. Mitral Valve: The mitral valve is abnormal. There is mild thickening of the mitral valve leaflet(s). There is mild calcification of the mitral valve leaflet(s). Moderate mitral annular calcification. Mild mitral valve regurgitation. Tricuspid Valve: The tricuspid valve is normal in structure. Tricuspid valve regurgitation is trivial. No evidence of tricuspid stenosis. Aortic Valve: The aortic valve is grossly normal. There is mild calcification of the aortic valve. There is mild thickening of the aortic valve. Aortic valve regurgitation is not visualized. Mild aortic valve sclerosis is present, with no evidence of aortic valve stenosis. Pulmonic Valve: The pulmonic valve was not well visualized. Pulmonic valve regurgitation is trivial. Aorta: The aortic root and ascending aorta are structurally normal, with no evidence of dilitation. Venous: The inferior vena cava was not well visualized. IAS/Shunts: The atrial septum is grossly normal.  LEFT VENTRICLE PLAX 2D LVIDd:         4.90 cm  Diastology LVIDs:         2.70 cm  LV e' medial:    5.11 cm/s LV PW:         0.90 cm  LV E/e' medial:  23.3 LV IVS:        1.10 cm  LV e' lateral:   7.07 cm/s LVOT diam:     1.90 cm  LV E/e' lateral: 16.8 LV SV:         89 LV SV Index:   48 LVOT Area:     2.84 cm  RIGHT VENTRICLE RV S prime:     10.80 cm/s TAPSE (M-mode): 1.8 cm LEFT ATRIUM         Index LA diam:    4.80 cm 2.57 cm/m  AORTIC VALVE LVOT Vmax:   142.00 cm/s LVOT Vmean:  95.200 cm/s LVOT VTI:    0.313 m  AORTA Ao Root diam: 2.60 cm MITRAL VALVE MV Area (PHT): 2.21 cm      SHUNTS MV Decel Time: 343 msec     Systemic VTI:  0.31 m MV E velocity: 119.00 cm/s  Systemic Diam: 1.90 cm Buford Dresser MD Electronically signed by Buford Dresser MD Signature Date/Time: 10/08/2020/1:36:02 PM    Final      Time coordinating discharge: Over 30 minutes    Dwyane Dee, MD  [facility] 10/12/2020, 3:22 PM

## 2020-10-12 NOTE — Progress Notes (Addendum)
Progress Note  Patient Name: Barbara [name] Date of Encounter: 10/12/2020  [facility] Cardiologist: Shelva Majestic, MD   Subjective   Feeling okay this morning. Did not sleep well last night. No complaints of chest pain, SOB, palpitations, dizziness, or lightheadedness. She is hopeful to be discharged home today.  Inpatient Medications    Scheduled Meds: . acetaminophen  1,000 mg Oral QHS  . amLODipine  2.5 mg Oral Daily  . apixaban  5 mg Oral BID  . dofetilide  125 mcg Oral BID  . irbesartan  150 mg Oral Daily  . mouth rinse  15 mL Mouth Rinse BID  . pantoprazole  40 mg Oral Daily  . polyethylene glycol  17 g Oral Daily  . rosuvastatin  40 mg Oral QHS  . senna-docusate  1 tablet Oral BID  . spironolactone  25 mg Oral Daily   Continuous Infusions: . magnesium sulfate bolus IVPB     PRN Meds: acetaminophen, lip balm, methocarbamol, sodium chloride   Vital Signs    Vitals:   10/11/20 1404 10/11/20 2036 10/12/20 0441 10/12/20 0525  BP: (!) 153/45 (!) 139/50 (!) 145/50   Pulse: (!) 51 (!) 47 (!) 52   Resp: 19 18 18    Temp: 98.2 F (36.8 C) 98.9 F (37.2 C) 98.4 F (36.9 C)   TempSrc: Oral Oral Oral   SpO2: 100% 98% 98%   Weight:    76.4 kg  Height:    5\' 7"  (1.702 m)    Intake/Output Summary (Last 24 hours) at 10/12/2020 0814 Last data filed at 10/12/2020 0705 Gross per 24 hour  Intake 960 ml  Output 950 ml  Net 10 ml   Last 3 Weights 10/12/2020 10/11/2020 10/10/2020  Weight (lbs) 168 lb 6.9 oz 168 lb 14 oz 169 lb 15.6 oz  Weight (kg) 76.4 kg 76.6 kg 77.1 kg      Telemetry    Sinus rhythm/sinus bradycardia -rates to 40s at times. - Personally Reviewed  ECG    Sinus bradycardia, rate 53 bpm, non-specific ST-T wave abnormalities, QTc 414 - Personally Reviewed  Physical Exam   GEN: No acute distress.   Neck: No JVD Cardiac: RRR, +murmur, no rubs or gallops.  Respiratory: Clear to auscultation bilaterally. GI: Soft, nontender, non-distended   MS: No edema; No deformity. Neuro:  Nonfocal  Psych: Normal affect   Labs    High Sensitivity Troponin:   Recent Labs  Lab 10/07/20 1822 10/07/20 1949 10/07/20 2112 10/08/20 0920 10/08/20 1148  TROPONINIHS 639* 737* 763* 736* 623*      Chemistry Recent Labs  Lab 10/08/20 0920 10/09/20 0234 10/10/20 0440 10/11/20 0449 10/12/20 0421  NA 139 138 139 138 138  K 4.4 4.1 4.3 4.4 4.4  CL 111 108 106 106 105  CO2 20* 23 24 23 25   GLUCOSE 101* 135* 103* 101* 105*  BUN 27* 30* 28* 24* 21  CREATININE 1.32* 1.52* 1.30* 1.36* 1.30*  CALCIUM 9.9 9.7 9.8 9.9 10.2  PROT 6.5 6.5  --   --   --   ALBUMIN 3.8 3.5  --   --   --   AST 27 22  --   --   --   ALT 15 16  --   --   --   ALKPHOS 57 66  --   --   --   BILITOT 0.7 0.7  --   --   --   GFRNONAA 41* 34* 41* 39* 41*  ANIONGAP 8 7 9 9 8      Hematology Recent Labs  Lab 10/10/20 0440 10/11/20 0449 10/12/20 0421  WBC 5.1 5.0 5.1  RBC 3.72* 3.96 4.00  HGB 10.9* 11.7* 11.7*  HCT 34.6* 36.8 37.1  MCV 93.0 92.9 92.8  MCH 29.3 29.5 29.3  MCHC 31.5 31.8 31.5  RDW 12.6 12.5 12.6  PLT 171 183 198    BNPNo results for input(s): BNP, PROBNP in the last 168 hours.   DDimer No results for input(s): DDIMER in the last 168 hours.   Radiology    No results found.  Cardiac Studies   Cath 02/26/2019  Prox RCA to Mid RCA lesion is 15% stenosed.  Ost LAD to Prox LAD lesion is 20% stenosed.  Prox Cx lesion is 20% stenosed.  Mild non-obstructive coronary artery disease with a patent proximal LAD stent with mild 20% intimal hyperplasia (inserted 09/1999); 20% proximal left circumflex narrowing, and mild irregularity of 10 to 15% in the mid RCA.  Hyperdynamic LV function with an "Ace of Spade "configuration and near cavity obliteration in the mid to apical segment during systole. There is evidence for left ventricular hypertrophy. LVEDP is 16 mm.  RECOMMENDATION: Medical therapy with optimal blood pressure control, lipid  management, and resumption of Eliquis tomorrow.   Echo 10/08/2020 1. Left ventricular ejection fraction, by estimation, is 70 to 75%. The  left ventricle has hyperdynamic function. The left ventricle has no  regional wall motion abnormalities. There is moderate eccentric left  ventricular hypertrophy of the apical  segment. Left ventricular diastolic parameters are indeterminate. Elevated  left ventricular end-diastolic pressure.  2. Right ventricular systolic function is normal. The right ventricular  size is normal. Tricuspid regurgitation signal is inadequate for assessing  PA pressure.  3. The mitral valve is abnormal. Mild mitral valve regurgitation.  Moderate mitral annular calcification.  4. The aortic valve is grossly normal. There is mild calcification of the  aortic valve. There is mild thickening of the aortic valve. Aortic valve  regurgitation is not visualized. Mild aortic valve sclerosis is present,  with no evidence of aortic valve  stenosis.   Comparison(s): No significant change from prior study.    Patient Profile     79 y.o.femalewith PMH of CAD, HTN, HLD, OSA, PAF on Eliquis and h/o bradycardia presented with afib with RVR, given coreg and IV diltiazem on top of home tikosyn, patient subsequently went into junctional rhythm. Now back in sinus bradycardia with HR 45-low 50s. Trop trended up to 600 without chest pain. Tikosyn restarted on 12/6  Assessment & Plan    1. Paroxysmal atrial fibrillation with RVR and also intermittent slow ventricular rate/junctional bradycardia: patient presented with recurrent atrial fibrillation. Prior to this admission her last episode was 11/2017. She was on tikosyn prior to admission, however this was briefly held after she developed bradycardia/junctional rhythm after receiving diltiazem and metoprolol in the ED. Tikosyn was restarted 10/09/20, though she missed >2 doses. QTc monitored closely - she completed 5 doses of tikosyn  this admission with stable QTc and no significant arrhythmias. QTc following the 5th dose was 414. AM EKG pending - Anticipate she will be stable for discharge home from a cardiology standpoint if AM EKG shows stable QTc - Continue tikosyn for rate/rhythm control - Continue eliquis for stroke ppx  2. Elevated troponin in patient with minimal CAD noted on cath 02/2019: HsTrop peaked at 763 and trended down. Suspected to be demand ischemia in the setting of atrial  fibrillation with RVR. Echo this admission with EF 70-75% and no RWMA. Not on BBlocker given baseline bradycardia - Do not anticipate further inpatient cardiac testing at this time.  - Continue crestor  3. HTN: BP remains above goal despite addition of low dose amlodipine yesterday - Will increase amlodipine to 5mg  daily.  - Continue ARB and spironolactone  4. HLD: LDL 50 10/08/20 - Continue crestor  [facility] will sign off.   Medication Recommendations:  Continue medications as above Other recommendations (labs, testing, etc):  None Follow up as an outpatient:  Will arrange outpatient follow-up in the Afib clinic and general cardiology  For questions or updates, please contact [place] Please consult [email or web address] for contact info under   Signed, Abigail Butts, PA-C  10/12/2020, 8:14 AM   As above, patient seen and examined.  Patient remains in sinus rhythm.  Continue Tikosyn and apixaban.  We will await follow-up electrocardiogram this morning.  If QT interval stable patient can be discharged and follow-up with Dr. Claiborne Billings.  Note we are increasing amlodipine for mildly elevated blood pressure.  She will need follow-up concerning this issue as an outpatient.  We will also arrange follow-up in atrial fibrillation clinic.  Follow-up Dr. Claiborne Billings 3 months. Kirk Ruths, MD

## 2020-10-12 NOTE — Progress Notes (Addendum)
Pt's 12-lead EKG showed QTc of 403 msec prior to administrating Tikosyn at around 8 pm. 12-lead EKG performed on pt 2-hrs later after administration. QTc was 414 msec. EKG reports in pt's chart.

## 2020-10-20 ENCOUNTER — Other Ambulatory Visit: Payer: Self-pay

## 2020-10-20 ENCOUNTER — Ambulatory Visit (HOSPITAL_COMMUNITY)
Admission: RE | Admit: 2020-10-20 | Discharge: 2020-10-20 | Disposition: A | Discharge status: Home / Self Care | Payer: Medicare Other | Source: Ambulatory Visit | Attending: Nurse Practitioner | Admitting: Nurse Practitioner

## 2020-10-20 ENCOUNTER — Ambulatory Visit (HOSPITAL_COMMUNITY)
Admit: 2020-10-20 | Discharge: 2020-10-20 | Disposition: A | Discharge status: Home / Self Care | Payer: Medicare Other | Attending: Nurse Practitioner | Admitting: Nurse Practitioner

## 2020-10-20 ENCOUNTER — Encounter (HOSPITAL_COMMUNITY): Payer: Self-pay | Admitting: Nurse Practitioner

## 2020-10-20 VITALS — BP 186/74 | HR 48 | Ht 67.0 in | Wt 169.0 lb

## 2020-10-20 DIAGNOSIS — R001 Bradycardia, unspecified: Secondary | ICD-10-CM | POA: Insufficient documentation

## 2020-10-20 DIAGNOSIS — Z96611 Presence of right artificial shoulder joint: Secondary | ICD-10-CM | POA: Insufficient documentation

## 2020-10-20 DIAGNOSIS — Z882 Allergy status to sulfonamides status: Secondary | ICD-10-CM | POA: Insufficient documentation

## 2020-10-20 DIAGNOSIS — Z90710 Acquired absence of both cervix and uterus: Secondary | ICD-10-CM | POA: Diagnosis not present

## 2020-10-20 DIAGNOSIS — I495 Sick sinus syndrome: Secondary | ICD-10-CM | POA: Diagnosis not present

## 2020-10-20 DIAGNOSIS — Z885 Allergy status to narcotic agent status: Secondary | ICD-10-CM | POA: Insufficient documentation

## 2020-10-20 DIAGNOSIS — Z87891 Personal history of nicotine dependence: Secondary | ICD-10-CM | POA: Diagnosis not present

## 2020-10-20 DIAGNOSIS — Z888 Allergy status to other drugs, medicaments and biological substances status: Secondary | ICD-10-CM | POA: Insufficient documentation

## 2020-10-20 DIAGNOSIS — I2510 Atherosclerotic heart disease of native coronary artery without angina pectoris: Secondary | ICD-10-CM | POA: Diagnosis not present

## 2020-10-20 DIAGNOSIS — I480 Paroxysmal atrial fibrillation: Secondary | ICD-10-CM

## 2020-10-20 DIAGNOSIS — I4891 Unspecified atrial fibrillation: Secondary | ICD-10-CM | POA: Insufficient documentation

## 2020-10-20 DIAGNOSIS — Z79899 Other long term (current) drug therapy: Secondary | ICD-10-CM | POA: Diagnosis not present

## 2020-10-20 DIAGNOSIS — Z6379 Other stressful life events affecting family and household: Secondary | ICD-10-CM | POA: Diagnosis not present

## 2020-10-20 DIAGNOSIS — R42 Dizziness and giddiness: Secondary | ICD-10-CM | POA: Insufficient documentation

## 2020-10-20 LAB — BASIC METABOLIC PANEL
Anion gap: 9 (ref 5–15)
BUN: 29 mg/dL — ABNORMAL HIGH (ref 8–23)
CO2: 23 mmol/L (ref 22–32)
Calcium: 10.4 mg/dL — ABNORMAL HIGH (ref 8.9–10.3)
Chloride: 107 mmol/L (ref 98–111)
Creatinine, Ser: 1.5 mg/dL — ABNORMAL HIGH (ref 0.44–1.00)
GFR, Estimated: 35 mL/min — ABNORMAL LOW (ref >60)
Glucose, Bld: 104 mg/dL — ABNORMAL HIGH (ref 70–99)
Potassium: 4.8 mmol/L (ref 3.5–5.1)
Sodium: 139 mmol/L (ref 135–145)

## 2020-10-20 LAB — MAGNESIUM: Magnesium: 2.1 mg/dL (ref 1.7–2.4)

## 2020-10-20 NOTE — Addendum Note (Signed)
Encounter addended by: Sherran Needs, NP on: 10/20/2020 11:48 AM  Actions taken: Clinical Note Signed

## 2020-10-20 NOTE — Progress Notes (Signed)
CSW consulted to speak with pt about help at home with her spouse.  Spouse has been getting weaker and fell recently leading pt to help him get back up and straining her back.  Concerns about pt safety being caregiver for pt.  CSW spoke with pt about different options and that fastest option would be to go through spouses PCP to get orders for home health services.  Wife attempted to contacted PCP this week but he was off- will plan to call first thing Monday to see about orders for PT/OT/aid.    If pt cannot qualify for home health discussed that other option would be paying for private duty care.  Provided pt with a list.  Pt hopeful that they can get home health temporarily to help out till pt cousin moves to [facility] in February to help out.  Pt reports no further needs at this time  Jorge Ny, [place] Worker [facility] Desk#: [phone] Cell#: [phone]

## 2020-10-20 NOTE — Progress Notes (Addendum)
Primary Care Physician: Laurey Morale, MD Referring Physician: [facility] f/u  Cardiology: Dr. Marcell Barlow Barbara [name] is a 79 y.o. female with a h/o CAD, afib on tikosyn that developed chest pain while trying to assist her husband after he fell to the floor. She presented to [facility] and had afib with RVR. She was started on diltiazem, Coreg, and continued on home Tikosyn.  She then became bradycardic with intermittent junctional rhythms.  All AV nodal agents were stopped.She also developed elevated troponin which trended up and was started on a heparin drip for possible ACS.  Cardiology was consulted. She was resumed back on Tikosyn 10/09/20 with stable qtc. She  was scheduled to f/u with the afib clinic.   She currently has a HR of 48 bpm in sinus Brady with stable QT. Marland Kitchen She is not on any AV nodal agents. She feels lightheaded at times. She  has not noted any further afib. She  feels overwhelmed caring for her husband with dementia. I will ask CSW to help her with community resources today.   Today, she denies symptoms of palpitations, chest pain, shortness of breath, orthopnea, PND, lower extremity edema, dizziness, presyncope, syncope, or neurologic sequela. The patient is tolerating medications without difficulties and is otherwise without complaint today.   Past Medical History:  Diagnosis Date  . A-fib ([place])   . Allergy   . CAD (coronary artery disease)    sees Dr. Shelva Majestic  cardiac stents - 2000  . CHF (congestive heart failure) ([place])   . Colon polyps   . Complication of anesthesia    rash/hives with "caines"  . Dyspnea    02/12/18 " when my heart gets out of rhythm, it has not been out of rhythym- since I have been on Tikosyn (11/2017)  . Dysrhythmia    afib fib  . GERD (gastroesophageal reflux disease)    takes OTC- Omeprazole- prn  . Heart murmur   . History of stress test    show normal perfusion without scar or ischemia, post EF 68%  . Hx of echocardiogram    show an EF  55%-60% range with grade 1 diastolic dysfunction, she had mitral anular calcification with mild MR, moderate LA dilation and mild pulmonary hypertension with a PA estimated pressure of 20mm  . Hyperlipidemia   . Hypertension   . NSTEMI (non-ST elevated myocardial infarction) ([place])   . Osteoarthritis   . Pneumonia    hx of 2015   . PONV (postoperative nausea and vomiting)    Past Surgical History:  Procedure Laterality Date  . CARDIAC CATHETERIZATION     11/2017  . cardiac stents  2000  . COLONOSCOPY  01-05-14   per Dr. Teena Irani, clear, no repeats needed   . COLONOSCOPY    . CORONARY STENT PLACEMENT  2000   in LAD  . DIRECT LARYNGOSCOPY WITH RADIAESSE INJECTION N/A 02/13/2018   Procedure: DIRECT LARYNGOSCOPY WITH RADIAESSE INJECTION;  Surgeon: Melida Quitter, MD;  Location: [place];  Service: ENT;  Laterality: N/A;  . EYE SURGERY Left    CATARACT REMOVAL  . KNEE ARTHROSCOPY Left 01/06/2015   Procedure: LEFT KNEE ARTHROSCOPY, abrasion chondroplasty of the medial femerol condryl,medial and lateral menisectomy, microfracture , synovectomy of the suprpatellar pouch;  Surgeon: Latanya Maudlin, MD;  Location: [facility];  Service: Orthopedics;  Laterality: Left;  . LEFT HEART CATH AND CORONARY ANGIOGRAPHY N/A 02/26/2019   Procedure: LEFT HEART CATH AND CORONARY ANGIOGRAPHY;  Surgeon: Claiborne Billings,  Joyice Faster, MD;  Location: [place] [facility];  Service: Cardiovascular;  Laterality: N/A;  . MICROLARYNGOSCOPY W/VOCAL CORD INJECTION N/A 08/07/2018   Procedure: MICROLARYNGOSCOPY WITH VOCAL CORD INJECTION OF PROLARYN;  Surgeon: Melida Quitter, MD;  Location: [place];  Service: ENT;  Laterality: N/A;  JET VENTILATION  . REVERSE SHOULDER ARTHROPLASTY Right 03/23/2020   Procedure: REVERSE SHOULDER ARTHROPLASTY;  Surgeon: Nicholes Stairs, MD;  Location: [place];  Service: Orthopedics;  Laterality: Right;  Marland Kitchen VAGINAL HYSTERECTOMY  1971    Current Outpatient Medications  Medication Sig Dispense Refill  . acetaminophen  (TYLENOL) 500 MG tablet Take 1,000 mg by mouth at bedtime.    Marland Kitchen amLODipine (NORVASC) 5 MG tablet Take 1 tablet (5 mg total) by mouth daily. 30 tablet 3  . amoxicillin (AMOXIL) 500 MG tablet Take 2,000 mg by mouth See admin instructions. Take 4 tablets (2000 mg) by mouth one hour prior to dental appointments    . candesartan (ATACAND) 16 MG tablet Take 1 tablet (16 mg total) by mouth daily. 90 tablet 3  . Cholecalciferol (VITAMIN D3) 2000 units TABS Take 2,000 Units by mouth daily.    Marland Kitchen dextromethorphan (DELSYM) 30 MG/5ML liquid Take 15 mg by mouth daily as needed for cough.     . diclofenac Sodium (VOLTAREN) 1 % GEL Apply 1 application topically 4 (four) times daily as needed (knee pain).     Marland Kitchen dofetilide (TIKOSYN) 125 MCG capsule Take 1 capsule (125 mcg total) by mouth 2 (two) times daily. 180 capsule 1  . ELIQUIS 5 MG TABS tablet TAKE 1 TABLET TWICE A DAY (Patient taking differently: Take 5 mg by mouth 2 (two) times daily.) 180 tablet 1  . hypromellose (VISTA GEL DRY EYE RELIEF) 0.3 % GEL ophthalmic ointment Place 1 application into both eyes at bedtime as needed for dry eyes.     Marland Kitchen ipratropium (ATROVENT) 0.06 % nasal spray Place 2 sprays into both nostrils daily as needed for rhinitis.   5  . ketotifen (ZADITOR) 0.025 % ophthalmic solution Place 1 drop into both eyes 2 (two) times daily as needed (eye irritation/allergy).     Marland Kitchen loratadine (CLARITIN) 10 MG tablet Take 10 mg by mouth daily as needed (allergies/sinus issues).     . Multiple Vitamin (MULTIVITAMIN WITH MINERALS) TABS tablet Take 1 tablet by mouth daily.    . nitroGLYCERIN (NITROSTAT) 0.4 MG SL tablet Place 1 tablet (0.4 mg total) under the tongue every 5 (five) minutes x 3 doses as needed for chest pain. 25 tablet 3  . pantoprazole (PROTONIX) 40 MG tablet Take 1 tablet (40 mg total) by mouth daily. 90 tablet 2  . PRESCRIPTION MEDICATION See admin instructions. CPAP- At bedtime    . rosuvastatin (CRESTOR) 40 MG tablet Take 1 tablet (40  mg total) by mouth daily. (Patient taking differently: Take 40 mg by mouth at bedtime.) 90 tablet 3  . spironolactone (ALDACTONE) 25 MG tablet Take 1 tablet (25 mg total) by mouth daily. 90 tablet 3  . triamcinolone (NASACORT) 55 MCG/ACT nasal inhaler Place 1 spray into both nostrils daily as needed (for allergies).      No current facility-administered medications for this encounter.    Allergies  Allergen Reactions  . Lidocaine Anaphylaxis  . Morphine Other (See Comments)    "Body shuts down"  . Procaine Hcl Anaphylaxis, Rash and Other (See Comments)    "Anything with 'caine' in it "  . Sulfonamide Derivatives Hives  . Norco [Hydrocodone-Acetaminophen] Other (See  Comments)    "Made me sick"  . Tramadol Nausea Only  . Vytorin [Ezetimibe-Simvastatin] Other (See Comments)    Unknown  . Tape Itching and Other (See Comments)    Patient prefers either paper tape or Coban wrap    Social History   Socioeconomic History  . Marital status: Married    Spouse name: Not on file  . Number of children: 2  . Years of education: Not on file  . Highest education level: Not on file  Occupational History  . Not on file  Tobacco Use  . Smoking status: Former Smoker    Years: 40.00    Quit date: 11/04/1996    Years since quitting: 23.9  . Smokeless tobacco: Never Used  Vaping Use  . Vaping Use: Never used  Substance and Sexual Activity  . Alcohol use: No    Alcohol/week: 0.0 standard drinks  . Drug use: No  . Sexual activity: Not on file  Other Topics Concern  . Not on file  Social History Narrative  . Not on file   Social Determinants of Health   Financial Resource Strain: Not on file  Food Insecurity: Not on file  Transportation Needs: Not on file  Physical Activity: Not on file  Stress: Not on file  Social Connections: Not on file  Intimate Partner Violence: Not on file    Family History  Problem Relation Age of Onset  . Cancer Maternal Grandmother   . Heart disease  Maternal Grandfather     ROS- All systems are reviewed and negative except as per the HPI above  Physical Exam: Vitals:   10/20/20 1003  BP: (!) 186/74  Pulse: (!) 48  Weight: 76.7 kg  Height: 5\' 7"  (1.702 m)   Wt Readings from Last 3 Encounters:  10/20/20 76.7 kg  10/12/20 76.4 kg  07/05/20 74.8 kg    Labs: Lab Results  Component Value Date   NA 138 10/12/2020   K 4.4 10/12/2020   CL 105 10/12/2020   CO2 25 10/12/2020   GLUCOSE 105 (H) 10/12/2020   BUN 21 10/12/2020   CREATININE 1.30 (H) 10/12/2020   CALCIUM 10.2 10/12/2020   MG 1.8 10/12/2020   Lab Results  Component Value Date   INR 1.4 (H) 10/08/2020   Lab Results  Component Value Date   CHOL 111 10/08/2020   HDL 49 10/08/2020   LDLCALC 50 10/08/2020   TRIG 62 10/08/2020     GEN- The patient is well appearing, alert and oriented x 3 today.   Head- normocephalic, atraumatic Eyes-  Sclera clear, conjunctiva pink Ears- hearing intact Oropharynx- clear Neck- supple, no JVP Lymph- no cervical lymphadenopathy Lungs- Clear to ausculation bilaterally, normal work of breathing Heart- Regular rate and rhythm, no murmurs, rubs or gallops, PMI not laterally displaced GI- soft, NT, ND, + BS Extremities- no clubbing, cyanosis, or edema MS- no significant deformity or atrophy Skin- no rash or lesion Psych- euthymic mood, full affect Neuro- strength and sensation are intact  EKG- Sinus brady at 48 bpm, pr int 184 ms, qrs int 82 ms, qtc 394 ms     Assessment and Plan: 1.Afib with tachy/brady syndrome  Recent breakthrough afib after assisting husband that had fallen  Home Tikosyn was held in the hospital with AV nodal agents after significant bradycardia but then reloaded at 125 mcg bid  Now back on Tikosyn in SR but with significant bradycardia, qtc stable  No AV nodal agents are on board It was  felt that PPM was needed while in the hospital Pt does have some lightheadedness at times Will place a one week  zio patch   2. CHA2DS2VASc score of 6  Continue  eliquis 5 mg bid   3. Stress from trying to deal with husband with dementia CSW in to provide community resources   F/u will depend on monitor results   F/u with Almyra Deforest, PA 1/6  Geroge Baseman. Macedonio Scallon, [facility] [address] [place], [place] [address] [phone]

## 2020-10-31 ENCOUNTER — Other Ambulatory Visit: Payer: Self-pay

## 2020-10-31 ENCOUNTER — Ambulatory Visit (INDEPENDENT_AMBULATORY_CARE_PROVIDER_SITE_OTHER): Payer: Medicare Other

## 2020-10-31 DIAGNOSIS — Z23 Encounter for immunization: Secondary | ICD-10-CM

## 2020-10-31 NOTE — Progress Notes (Signed)
Administered high dose flu shot tp py today.

## 2020-11-07 ENCOUNTER — Other Ambulatory Visit: Payer: Self-pay

## 2020-11-07 ENCOUNTER — Encounter: Payer: Self-pay | Admitting: Podiatry

## 2020-11-07 ENCOUNTER — Ambulatory Visit (INDEPENDENT_AMBULATORY_CARE_PROVIDER_SITE_OTHER): Payer: Medicare Other | Admitting: Podiatry

## 2020-11-07 DIAGNOSIS — M79674 Pain in right toe(s): Secondary | ICD-10-CM | POA: Diagnosis not present

## 2020-11-07 DIAGNOSIS — B351 Tinea unguium: Secondary | ICD-10-CM | POA: Diagnosis not present

## 2020-11-07 DIAGNOSIS — M79675 Pain in left toe(s): Secondary | ICD-10-CM | POA: Diagnosis not present

## 2020-11-09 ENCOUNTER — Ambulatory Visit: Payer: Medicare Other | Admitting: Physician Assistant

## 2020-11-09 NOTE — Progress Notes (Signed)
Subjective: Barbara [name] is a pleasant 80 y.o. female patient seen today painful thick toenails that are difficult to trim. Pain interferes with ambulation. Aggravating factors include wearing enclosed shoe gear. Pain is relieved with periodic professional debridement.   Patient's husband is present during today's visit. She voices no new pedal problem for today's visit.  Past Medical History:  Diagnosis Date  . A-fib ([place])   . Allergy   . CAD (coronary artery disease)    sees Dr. Shelva Majestic  cardiac stents - 2000  . CHF (congestive heart failure) ([place])   . Colon polyps   . Complication of anesthesia    rash/hives with "caines"  . Dyspnea    02/12/18 " when my heart gets out of rhythm, it has not been out of rhythym- since I have been on Tikosyn (11/2017)  . Dysrhythmia    afib fib  . GERD (gastroesophageal reflux disease)    takes OTC- Omeprazole- prn  . Heart murmur   . History of stress test    show normal perfusion without scar or ischemia, post EF 68%  . Hx of echocardiogram    show an EF 55%-60% range with grade 1 diastolic dysfunction, she had mitral anular calcification with mild MR, moderate LA dilation and mild pulmonary hypertension with a PA estimated pressure of 22mm  . Hyperlipidemia   . Hypertension   . NSTEMI (non-ST elevated myocardial infarction) ([place])   . Osteoarthritis   . Pneumonia    hx of 2015   . PONV (postoperative nausea and vomiting)     Patient Active Problem List   Diagnosis Date Noted  . Atrial fibrillation with RVR ([place]) 10/08/2020  . Persistent atrial fibrillation ([place]) 10/07/2020  . CKD (chronic kidney disease), stage III ([place]) 05/20/2020  . Apical variant hypertrophic cardiomyopathy ([place])   . Chest pain of uncertain etiology   . Chest pain   . Pure hypercholesterolemia   . 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 PAF (paroxysmal atrial fibrillation) ([place]) 02/28/2019  . NSTEMI (non-ST elevated myocardial  infarction) ([place]) 02/25/2019  . Pain in left knee 01/06/2018  . Dysphonia 12/29/2017  . 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 CHF (congestive heart failure) ([place]) 11/16/2017  . Gastroesophageal reflux disease 11/06/2017  . Bradycardia 12/27/2015  . Exertional dyspnea 12/23/2014  . Preoperative clearance 12/23/2014  . Hyperlipidemia LDL goal <70 09/13/2014  . Sinus bradycardia 09/13/2014  . LUMBAGO 05/03/2010  . VERTIGO 01/21/2009  . TEMPOROMANDIBULAR JOINT PAIN 08/31/2008  . Osteoarthritis 05/31/2008  . 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 HYPERLIPIDEMIA 04/06/2007  . Essential hypertension 04/06/2007  . CAD (coronary artery disease) 04/06/2007  . ALLERGIC RHINITIS 04/06/2007  . Personal history of colonic polyps 04/06/2007    Current Outpatient Medications on File Prior to Visit  Medication Sig Dispense Refill  . acetaminophen (TYLENOL) 500 MG tablet Take 1,000 mg by mouth at bedtime.    Marland Kitchen amLODipine (NORVASC) 5 MG tablet Take 1 tablet (5 mg total) by mouth daily. 30 tablet 3  . amoxicillin (AMOXIL) 500 MG tablet Take 2,000 mg by mouth See admin instructions. Take 4 tablets (2000 mg) by mouth one hour prior to dental appointments    . candesartan (ATACAND) 16 MG tablet Take 1 tablet (16 mg total) by mouth daily. 90 tablet 3  . Cholecalciferol (VITAMIN D3) 2000 units TABS Take 2,000 Units by mouth daily.    Marland Kitchen dextromethorphan (DELSYM) 30 MG/5ML liquid Take 15 mg by mouth daily as needed  for cough.     . diclofenac Sodium (VOLTAREN) 1 % GEL Apply 1 application topically 4 (four) times daily as needed (knee pain).     Marland Kitchen dofetilide (TIKOSYN) 125 MCG capsule Take 1 capsule (125 mcg total) by mouth 2 (two) times daily. 180 capsule 1  . ELIQUIS 5 MG TABS tablet TAKE 1 TABLET TWICE A DAY (Patient taking differently: Take 5 mg by mouth 2 (two) times daily.) 180 tablet 1  . hypromellose (VISTA GEL DRY EYE RELIEF) 0.3 % GEL ophthalmic ointment Place 1 application into both eyes at bedtime  as needed for dry eyes.     Marland Kitchen ipratropium (ATROVENT) 0.06 % nasal spray Place 2 sprays into both nostrils daily as needed for rhinitis.   5  . ketotifen (ZADITOR) 0.025 % ophthalmic solution Place 1 drop into both eyes 2 (two) times daily as needed (eye irritation/allergy).     Marland Kitchen loratadine (CLARITIN) 10 MG tablet Take 10 mg by mouth daily as needed (allergies/sinus issues).     . Multiple Vitamin (MULTIVITAMIN WITH MINERALS) TABS tablet Take 1 tablet by mouth daily.    . nitroGLYCERIN (NITROSTAT) 0.4 MG SL tablet Place 1 tablet (0.4 mg total) under the tongue every 5 (five) minutes x 3 doses as needed for chest pain. 25 tablet 3  . pantoprazole (PROTONIX) 40 MG tablet Take 1 tablet (40 mg total) by mouth daily. 90 tablet 2  . PRESCRIPTION MEDICATION See admin instructions. CPAP- At bedtime    . rosuvastatin (CRESTOR) 40 MG tablet Take 1 tablet (40 mg total) by mouth daily. (Patient taking differently: Take 40 mg by mouth at bedtime.) 90 tablet 3  . spironolactone (ALDACTONE) 25 MG tablet Take 1 tablet (25 mg total) by mouth daily. 90 tablet 3  . triamcinolone (NASACORT) 55 MCG/ACT nasal inhaler Place 1 spray into both nostrils daily as needed (for allergies).      No current facility-administered medications on file prior to visit.    Allergies  Allergen Reactions  . Lidocaine Anaphylaxis  . Morphine Other (See Comments)    "Body shuts down"  . Procaine Hcl Anaphylaxis, Rash and Other (See Comments)    "Anything with 'caine' in it "  . Sulfonamide Derivatives Hives  . Norco [Hydrocodone-Acetaminophen] Other (See Comments)    "Made me sick"  . Tramadol Nausea Only  . Vytorin [Ezetimibe-Simvastatin] Other (See Comments)    Unknown  . Tape Itching and Other (See Comments)    Patient prefers either paper tape or Coban wrap   Objective: Physical Exam  General: Barbara [name] is a pleasant 80 y.o. African American female, in NAD. AAO x 3.   Vascular:  Neurovascular status  unchanged b/l lower extremities. Capillary refill time to digits immediate b/l. Palpable pedal pulses b/l LE. Pedal hair sparse. Lower extremity skin temperature gradient within normal limits. Nonpitting edema noted b/l lower extremities.  Dermatological:  Pedal skin with normal turgor, texture and tone bilaterally. No open wounds bilaterally. No interdigital macerations bilaterally. Toenails 1-5 b/l elongated, discolored, dystrophic, thickened, crumbly with subungual debris and tenderness to dorsal palpation.  Musculoskeletal:  Normal muscle strength 5/5 to all lower extremity muscle groups bilaterally. No pain crepitus or joint limitation noted with ROM b/l. No gross bony deformities bilaterally. Utilizes cane for ambulation assistance.  Neurological:  Protective sensation intact 5/5 intact bilaterally with 10g monofilament b/l. Vibratory sensation intact b/l. Proprioception intact bilaterally.  Assessment and Plan:  1. Pain due to onychomycosis of toenails of both feet    -  Examined patient. -No new findings. No new orders. -Toenails 1-5 b/l were debrided in length and girth with sterile nail nippers and dremel without iatrogenic bleeding.  -Patient to continue soft, supportive shoe gear daily. -Patient/POA to call should there be question/concern in the interim.  Return in about 3 months (around 02/05/2021).  Freddie Breech, DPM

## 2020-11-10 ENCOUNTER — Telehealth (HOSPITAL_COMMUNITY): Payer: Self-pay | Admitting: Cardiology

## 2020-11-10 NOTE — Telephone Encounter (Signed)
Pt notified. No afib no indication for pacemaker at this time.

## 2020-11-10 NOTE — Telephone Encounter (Signed)
Patient left voicemail requesting monitor results  Afib patient- will forward to afib team

## 2020-11-20 ENCOUNTER — Telehealth: Payer: Self-pay | Admitting: Physician Assistant

## 2020-11-20 NOTE — Telephone Encounter (Signed)
Spoke with patient to reschedule her 11/21/20 visit with Almyra Deforest, PA---rescheduled to 11/24/20 at 3:15 pm---patient voiced her understanding.

## 2020-11-21 ENCOUNTER — Ambulatory Visit: Payer: Medicare Other | Admitting: Physician Assistant

## 2020-11-23 ENCOUNTER — Telehealth: Payer: Self-pay | Admitting: Family Medicine

## 2020-11-23 NOTE — Telephone Encounter (Signed)
Left message for patient to call back and schedule Medicare Annual Wellness Visit (AWV) either virtually or in office.   Last AWV no information please schedule at anytime with [facility] Nurse Health Advisor 1 or 2   This should be a 45 minute visit. 

## 2020-11-24 ENCOUNTER — Other Ambulatory Visit: Payer: Self-pay

## 2020-11-24 ENCOUNTER — Ambulatory Visit (INDEPENDENT_AMBULATORY_CARE_PROVIDER_SITE_OTHER): Payer: Medicare Other | Admitting: Adult Health

## 2020-11-24 ENCOUNTER — Encounter: Payer: Self-pay | Admitting: Adult Health

## 2020-11-24 VITALS — BP 158/74 | HR 59 | Wt 173.4 lb

## 2020-11-24 DIAGNOSIS — E7800 Pure hypercholesterolemia, unspecified: Secondary | ICD-10-CM

## 2020-11-24 DIAGNOSIS — F439 Reaction to severe stress, unspecified: Secondary | ICD-10-CM

## 2020-11-24 DIAGNOSIS — I480 Paroxysmal atrial fibrillation: Secondary | ICD-10-CM | POA: Diagnosis not present

## 2020-11-24 DIAGNOSIS — I10 Essential (primary) hypertension: Secondary | ICD-10-CM

## 2020-11-24 DIAGNOSIS — G4733 Obstructive sleep apnea (adult) (pediatric): Secondary | ICD-10-CM | POA: Diagnosis not present

## 2020-11-24 DIAGNOSIS — Z0279 Encounter for issue of other medical certificate: Secondary | ICD-10-CM

## 2020-11-24 NOTE — Progress Notes (Signed)
Cardiology Office Note   Date:  11/24/2020   ID:  Barbara [name], DOB 1941/03/28, MRN [id number]  PCP:  Laurey Morale, MD  Cardiologist:  Dr. Claiborne Billings CC: Hospital Follow Up   History of Present Illness:  Barbara [name] is a 80 y.o. female who presents for posthospital follow-up after admission on 10/07/2020 in the setting of atrial fibrillation with RVR.  Along with ongoing assessment and management of coronary artery disease with prior PCI in 2000, apical hypertrophic cardiomyopathy, hypertension, hyperlipidemia, atrial flutter/atrial fibrillation on dofetilide, OSA on CPAP.  She was seen in consultation by Dr. Harrell Gave.  She noted that her initial ER EKG revealed atrial fibrillation with a rate of 158 bpm, with later an EKG showing slowing and no flutter waves.  Further EKGs throughout ED evaluation revealed controlled atrial fibrillation, 1 was concerning for junctional bradycardia, and finally atrial fibrillation with slow ventricular response.    The patient apparently had strained her self while trying to lift her husband off of the floor  when he fell out of bed at 4:30 in the morning.  When EMS came to help the the patient's husband back to bed she began to have rapid heart rhythm.  She did admit to missing one of her morning medications to include Tikosyn and apixaban just before Thanksgiving but otherwise had not missed any doses.  The patient initially was on diltiazem however this was discontinued as she was also on dofetilide (combination is not recommended as diltiazem can increase blood levels of dofetilide).  She was to avoid all AV nodal blocking agents until heart rate improved.  She was also noted to have elevated troponin's.  She subsequently remained in normal sinus rhythm with sinus bradycardia heart rates in the 45-50 range.    Tikosyn was restarted on 10/09/2020.  She was restarted on Eliquis after being placed on heparin temporarily in case she needed  cardiac cath in the setting of elevated troponins.  Cath was not completed as her troponins decreased and it was suspected that she was having demand ischemia in the setting of atrial fibrillation with RVR.  Amlodipine was increased to 5 mg daily.  Crestor was continued with LDL measured at 50.  She was seen by electrophysiology on 10/20/2020 who recommended that she wear a 1 week ZIO cardiac patch.  Patch was read on 11/07/2020 revealing sinus rhythm with frequent PACs, nonsustained atrial tachycardia and a disorganized atrial activity but no atrial fib.  Patient comes today tired but without complaints.  She is tolerating the Tikosyn, amlodipine, and Eliquis without issue.  She does state that her insurance company did not want to pay for Eliquis without a prior authorization.  We will make sure that this is completed.  She denies any chest pain or dyspnea on exertion.  Her main concern is lack of sleep due to her husband with Alzheimer's.  He is up between the hours of 1 AM and 5:30 AM.  He does not allow her to sleep and she worries that he will wander outside of the home.  She tries to sleep as much as she can but is often trying to take care of him.  She should be wearing her CPAP, but does not wear it because she cannot hear her husband when he is up roaming in the house.  This is created a great deal of stress and anxiety for her.  She has a grandson who is in town but he works for [employer] and is  unable to be with her husband at nighttime.  Other family members are in [place].  She is reach out to primary care physician Dr. Sarajane Jews, via [place], to discuss having help in her home overnight.  He has referred him to neurology.  So far she has not received any feedback from him concerning home health nurses.  I have advised her to recheck with Dr. Sarajane Jews as he may have something planned for him in the works.   Past Medical History:  Diagnosis Date  . A-fib ([place])   . Allergy   . CAD (coronary artery disease)     sees Dr. Shelva Majestic  cardiac stents - 2000  . CHF (congestive heart failure) ([place])   . Colon polyps   . Complication of anesthesia    rash/hives with "caines"  . Dyspnea    02/12/18 " when my heart gets out of rhythm, it has not been out of rhythym- since I have been on Tikosyn (11/2017)  . Dysrhythmia    afib fib  . GERD (gastroesophageal reflux disease)    takes OTC- Omeprazole- prn  . Heart murmur   . History of stress test    show normal perfusion without scar or ischemia, post EF 68%  . Hx of echocardiogram    show an EF 55%-60% range with grade 1 diastolic dysfunction, she had mitral anular calcification with mild MR, moderate LA dilation and mild pulmonary hypertension with a PA estimated pressure of 76mm  . Hyperlipidemia   . Hypertension   . NSTEMI (non-ST elevated myocardial infarction) ([place])   . Osteoarthritis   . Pneumonia    hx of 2015   . PONV (postoperative nausea and vomiting)     Past Surgical History:  Procedure Laterality Date  . CARDIAC CATHETERIZATION     11/2017  . cardiac stents  2000  . COLONOSCOPY  01-05-14   per Dr. Teena Irani, clear, no repeats needed   . COLONOSCOPY    . CORONARY STENT PLACEMENT  2000   in [facility]  . DIRECT LARYNGOSCOPY WITH RADIAESSE INJECTION N/A 02/13/2018   Procedure: DIRECT LARYNGOSCOPY WITH RADIAESSE INJECTION;  Surgeon: Melida Quitter, MD;  Location: [facility];  Service: ENT;  Laterality: N/A;  . EYE SURGERY Left    CATARACT REMOVAL  . KNEE ARTHROSCOPY Left 01/06/2015   Procedure: LEFT KNEE ARTHROSCOPY, abrasion chondroplasty of the medial femerol condryl,medial and lateral menisectomy, microfracture , synovectomy of the suprpatellar pouch;  Surgeon: Latanya Maudlin, MD;  Location: [facility];  Service: Orthopedics;  Laterality: Left;  . LEFT HEART CATH AND CORONARY ANGIOGRAPHY N/A 02/26/2019   Procedure: LEFT HEART CATH AND CORONARY ANGIOGRAPHY;  Surgeon: Troy Sine, MD;  Location: [facility];  Service: Cardiovascular;   Laterality: N/A;  . MICROLARYNGOSCOPY W/VOCAL CORD INJECTION N/A 08/07/2018   Procedure: MICROLARYNGOSCOPY WITH VOCAL CORD INJECTION OF PROLARYN;  Surgeon: Melida Quitter, MD;  Location: [facility];  Service: ENT;  Laterality: N/A;  JET VENTILATION  . REVERSE SHOULDER ARTHROPLASTY Right 03/23/2020   Procedure: REVERSE SHOULDER ARTHROPLASTY;  Surgeon: Nicholes Stairs, MD;  Location: [facility];  Service: Orthopedics;  Laterality: Right;  Marland Kitchen VAGINAL HYSTERECTOMY  1971     Current Outpatient Medications  Medication Sig Dispense Refill  . acetaminophen (TYLENOL) 500 MG tablet Take 1,000 mg by mouth at bedtime.    Marland Kitchen amLODipine (NORVASC) 5 MG tablet Take 1 tablet (5 mg total) by mouth daily. 30 tablet 3  . amoxicillin (AMOXIL) 500 MG tablet Take 2,000  mg by mouth See admin instructions. Take 4 tablets (2000 mg) by mouth one hour prior to dental appointments    . candesartan (ATACAND) 16 MG tablet Take 1 tablet (16 mg total) by mouth daily. 90 tablet 3  . Cholecalciferol (VITAMIN D3) 2000 units TABS Take 2,000 Units by mouth daily.    Marland Kitchen dextromethorphan (DELSYM) 30 MG/5ML liquid Take 15 mg by mouth daily as needed for cough.     . diclofenac Sodium (VOLTAREN) 1 % GEL Apply 1 application topically 4 (four) times daily as needed (knee pain).     Marland Kitchen dofetilide (TIKOSYN) 125 MCG capsule Take 1 capsule (125 mcg total) by mouth 2 (two) times daily. 180 capsule 1  . ELIQUIS 5 MG TABS tablet TAKE 1 TABLET TWICE A DAY (Patient taking differently: Take 5 mg by mouth 2 (two) times daily.) 180 tablet 1  . hypromellose (VISTA GEL DRY EYE RELIEF) 0.3 % GEL ophthalmic ointment Place 1 application into both eyes at bedtime as needed for dry eyes.     Marland Kitchen ipratropium (ATROVENT) 0.06 % nasal spray Place 2 sprays into both nostrils daily as needed for rhinitis.   5  . ketotifen (ZADITOR) 0.025 % ophthalmic solution Place 1 drop into both eyes 2 (two) times daily as needed (eye irritation/allergy).     Marland Kitchen loratadine (CLARITIN) 10  MG tablet Take 10 mg by mouth daily as needed (allergies/sinus issues).     . Multiple Vitamin (MULTIVITAMIN WITH MINERALS) TABS tablet Take 1 tablet by mouth daily.    . nitroGLYCERIN (NITROSTAT) 0.4 MG SL tablet Place 1 tablet (0.4 mg total) under the tongue every 5 (five) minutes x 3 doses as needed for chest pain. 25 tablet 3  . pantoprazole (PROTONIX) 40 MG tablet Take 1 tablet (40 mg total) by mouth daily. 90 tablet 2  . PRESCRIPTION MEDICATION See admin instructions. CPAP- At bedtime    . rosuvastatin (CRESTOR) 40 MG tablet Take 1 tablet (40 mg total) by mouth daily. (Patient taking differently: Take 40 mg by mouth at bedtime.) 90 tablet 3  . spironolactone (ALDACTONE) 25 MG tablet Take 1 tablet (25 mg total) by mouth daily. 90 tablet 3  . triamcinolone (NASACORT) 55 MCG/ACT nasal inhaler Place 1 spray into both nostrils daily as needed (for allergies).      No current facility-administered medications for this visit.    Allergies:   Lidocaine, Morphine, Procaine hcl, Sulfonamide derivatives, Norco [hydrocodone-acetaminophen], Tramadol, Vytorin [ezetimibe-simvastatin], and Tape    Social History:  The patient  reports that she quit smoking about 24 years ago. She quit after 40.00 years of use. She has never used smokeless tobacco. She reports that she does not drink alcohol and does not use drugs.   Family History:  The patient's family history includes Cancer in her maternal grandmother; Heart disease in her maternal grandfather.    ROS: All other systems are reviewed and negative. Unless otherwise mentioned in H&P    PHYSICAL EXAM: VS:  BP (!) 158/74 (BP Location: Left Arm, Patient Position: Sitting, Cuff Size: Normal)   Pulse (!) 59   Wt 173 lb 6.4 oz (78.7 kg)   SpO2 97%   BMI 27.16 kg/m  , BMI Body mass index is 27.16 kg/m. GEN: Well nourished, well developed, in no acute distress HEENT: normal Neck: no JVD, carotid bruits, or masses Cardiac: RRR; 2/6 holosystolic aortic  with radiation to the right carotid, no rubs, or gallops, mild nonpitting bilateral ankle edema  Respiratory:  Clear  to auscultation bilaterally, normal work of breathing GI: soft, nontender, nondistended, + BS MS: no deformity or atrophy Skin: warm and dry, no rash Neuro:  Strength and sensation are intact Psych: euthymic mood, full affect   EKG: Not completed this office visit.  Personally reviewed most recent EKG on 10/20/2020, sinus bradycardia heart rate of 48 bpm, with LVH.  Recent Labs: 10/08/2020: TSH 2.341 10/09/2020: ALT 16 10/12/2020: Hemoglobin 11.7; Platelets 198 10/20/2020: BUN 29; Creatinine, Ser 1.50; Magnesium 2.1; Potassium 4.8; Sodium 139    Lipid Panel    Component Value Date/Time   CHOL 111 10/08/2020 0313   TRIG 62 10/08/2020 0313   HDL 49 10/08/2020 0313   CHOLHDL 2.3 10/08/2020 0313   VLDL 12 10/08/2020 0313   LDLCALC 50 10/08/2020 0313      Wt Readings from Last 3 Encounters:  11/24/20 173 lb 6.4 oz (78.7 kg)  10/20/20 169 lb (76.7 kg)  10/12/20 168 lb 6.9 oz (76.4 kg)      Other studies Reviewed: NST 05/19/20: IMPRESSION: 1. No reversible ischemia or infarction. 2. Septal hypokinesia. 3. Left ventricular ejection fraction 62% 4. Non invasive risk stratification*: Low  Echocardiogram 05/18/20: 1. Left ventricular ejection fraction, by estimation, is 70 to 75%. The  left ventricle has hyperdynamic function. The left ventricle has no  regional wall motion abnormalities. There is mild left ventricular  hypertrophy. Left ventricular diastolic  parameters are consistent with Grade II diastolic dysfunction  (pseudonormalization). Elevated left atrial pressure.  2. Right ventricular systolic function is normal. The right ventricular  size is normal. There is mildly elevated pulmonary artery systolic  pressure. The estimated right ventricular systolic pressure is 40.3 mmHg.  3. Left atrial size was severely dilated.  4. The mitral valve is  normal in structure. Mild mitral valve  regurgitation.  5. The aortic valve is tricuspid. Aortic valve regurgitation is not  visualized. Mild aortic valve sclerosis is present, with no evidence of  aortic valve stenosis.  6. Apical hypertrophy with obliteration of LV cavity in systole at apex,  concerning for apical HCM, would consider cardiac MRI for further  evaluation   Cardiac MRI 05/19/20: IMPRESSION: 1. LV apical hypertrophy measuring up to 73mm (62mm in basal posterior wall), consistent with apical hypertrophic cardiomyopathy 2. Patchy late gadolinium enhancement at apex and RV insertion site, consistent with apical HCM. LGE accounts for 2% of total myocardial mass 3. Normal LV size with hyperdynamic systolic function (EF 47%) 4. Normal RV size with hyperdynamic systolic function (EF 42%) Electronically Signed By: Oswaldo Milian MD On: 05/19/2020 16:38   Zio Cardiac Monitor: 11/07/2020  Patient had a min HR of 34 bpm, max HR of 187 bpm, and avg HR of 57 bpm. Predominant underlying rhythm was Sinus Rhythm. 23 Supraventricular Tachycardia runs occurred, the run with the fastest interval lasting 7 beats with a max rate of 187 bpm, the longest lasting 20 beats with an avg rate of 94 bpm. Isolated SVEs were occasional (1.2%, 6664), SVE Couplets were rare (<1.0%, 74), and SVE Triplets were rare (<1.0%, 13). Isolated VEs were rare (<1.0%), VE Couplets were rare (<1.0%), and no VE Triplets were present.   ASSESSMENT AND PLAN:  1. Atrial fib: Currently in regular rhythm per auscultation. She is tolerant of her medication regimen of Tikosyn, and Eliquis. We will provide a PO to allow her to receive the Eliquis. No changes in regimen at this time.  2. Hypertension: Multifactorial.  Significant stress at home due to husband's Alzheimer's disease,  being awake all night, inability to use CPAP regularly. I did discuss possibly going up on amlodipine to 7.5 mg daily. I have given her a  "Salty Six" guide to help her with salt intake.    3. OSA: Inconsistent with CPAP due to husband's illness. I have suggested that she contact Dr. Sarajane Jews.   4. Situational stress: This is impacting her health, leading to recent hospitalization. She will need more help in her home, especially at night to care for her husband so that she can sleep and have better outcomes with CPAP , and overall stress reduction. She is advised to speak with Dr. Sarajane Jews, both her and her husband's primary care provider for help in obtaining HHN during the night. This note also goes to Dr. Sarajane Jews.   Current medicines are reviewed at length with the patient today.  I have spent 25 minutes dedicated to the care of this patient on the date of this encounter to include pre-visit review of records, assessment, management and diagnostic testing,with shared decision making.  Labs/ tests ordered today include: None   Phill Myron. West Pugh, ANP, AACC   11/24/2020 4:01 PM    [facility] [address] Office [phone] Fax [phone]  Notice: This dictation was prepared with Dragon dictation along with smaller phrase technology. Any transcriptional errors that result from this process are unintentional and may not be corrected upon review.

## 2020-11-24 NOTE — Patient Instructions (Signed)
Medication Instructions:  The current medical regimen is effective;  continue present plan and medications as directed. Please refer to the Current Medication list given to you today. *If you need a refill on your cardiac medications before your next appointment, please call your pharmacy*  Lab Work:   Testing/Procedures:  NONE    NONE  Special Instructions PLEASE READ AND FOLLOW SALTY 6-ATTACHED-1,800mg  daily  Follow-Up: Your next appointment:  3 month(s) In Person with Shelva Majestic, MD OR IF UNAVAILABLE [place], FNP-C  At [facility], you and your health needs are our priority.  As part of our continuing mission to provide you with exceptional heart care, we have created designated Provider Care Teams.  These Care Teams include your primary Cardiologist (physician) and Advanced Practice Providers (APPs -  Physician Assistants and Nurse Practitioners) who all work together to provide you with the care you need, when you need it.            6 SALTY THINGS TO AVOID     1,800MG  DAILY

## 2020-11-24 NOTE — Progress Notes (Signed)
Patient's husband has Alzheimer's and is up at night wandering in the home, turning on lights. She states it begins at 1 am and goes on until 5-5:30 am. She is unable to sleep or use her CPAP because she must listen out for him and make sure he is not doing anything or leaving the home.  He eats breakfast several times at night. This has caused great stress for her, keeping her BP up and also not allowing use of CPAP  I recommend home health nurses to come at night to be with him so that she can sleep. As her husband is not my patient we are unable to begin this process. She will try speaking with Dr. Sarajane Jews again concerning help for her husband.

## 2020-11-27 ENCOUNTER — Telehealth: Payer: Self-pay | Admitting: Adult Health

## 2020-11-27 NOTE — Telephone Encounter (Signed)
Received forms from patient on 1/21, given to provider to complete, completed form returned on 1/24 and patient was contacted to pick up from at front desk in envelope.

## 2020-11-28 ENCOUNTER — Other Ambulatory Visit: Payer: Self-pay

## 2020-11-28 MED ORDER — DOFETILIDE 125 MCG PO CAPS
125.0000 ug | ORAL_CAPSULE | Freq: Two times a day (BID) | ORAL | 1 refills | Status: DC
Start: 2020-11-28 — End: 2021-05-18

## 2020-11-29 NOTE — Telephone Encounter (Signed)
Forms were faxed on 1/24.

## 2020-12-07 ENCOUNTER — Telehealth: Payer: Self-pay

## 2020-12-07 MED ORDER — APIXABAN 5 MG PO TABS
5.0000 mg | ORAL_TABLET | Freq: Two times a day (BID) | ORAL | 1 refills | Status: DC
Start: 2020-12-07 — End: 2021-05-10

## 2020-12-07 NOTE — Telephone Encounter (Signed)
Prior authorization for Eliquis 5 mg received from [facility] and noted PA was denied due to pt having medicare part D. Will have PA department resubmit under that plan.     Message sent to PA department for processing.

## 2020-12-07 NOTE — Telephone Encounter (Signed)
I have submitted PA under [employer] [employer] plan- This appears to be a commercial plan. Awaiting determination Spoke with patient. She already has copay card. Was getting reimbursed by [employer] after sending to mail order pharmacy. Advised it would be easier to use local pharmacy. Pt agreeable. Copay card will pick up cost even if not covered yet. Pt reports ID # on card is [id number] who processed card- copay now $30

## 2020-12-11 NOTE — Telephone Encounter (Signed)
PA for Eliquis denied. Appeals letter faxed

## 2020-12-14 NOTE — Telephone Encounter (Signed)
Called and spoke w/pt that they were approved for eliquis pt voiced understanding

## 2021-01-17 ENCOUNTER — Other Ambulatory Visit: Payer: Self-pay

## 2021-01-17 ENCOUNTER — Encounter: Payer: Self-pay | Admitting: Cardiovascular Disease

## 2021-01-17 ENCOUNTER — Ambulatory Visit (INDEPENDENT_AMBULATORY_CARE_PROVIDER_SITE_OTHER): Payer: Medicare Other | Admitting: Cardiovascular Disease

## 2021-01-17 DIAGNOSIS — I10 Essential (primary) hypertension: Secondary | ICD-10-CM | POA: Diagnosis not present

## 2021-01-17 DIAGNOSIS — E785 Hyperlipidemia, unspecified: Secondary | ICD-10-CM | POA: Diagnosis not present

## 2021-01-17 DIAGNOSIS — I480 Paroxysmal atrial fibrillation: Secondary | ICD-10-CM

## 2021-01-17 DIAGNOSIS — I2510 Atherosclerotic heart disease of native coronary artery without angina pectoris: Secondary | ICD-10-CM

## 2021-01-17 DIAGNOSIS — Z7901 Long term (current) use of anticoagulants: Secondary | ICD-10-CM

## 2021-01-17 DIAGNOSIS — G4733 Obstructive sleep apnea (adult) (pediatric): Secondary | ICD-10-CM | POA: Diagnosis not present

## 2021-01-17 DIAGNOSIS — Z9989 Dependence on other enabling machines and devices: Secondary | ICD-10-CM

## 2021-01-17 DIAGNOSIS — I422 Other hypertrophic cardiomyopathy: Secondary | ICD-10-CM

## 2021-01-17 NOTE — Progress Notes (Signed)
Cardiology Office Note    Date:  01/22/2021   ID:  JAQUIA BENEDICTO, DOB 08-24-1941, MRN [id number]  PCP:  Laurey Morale, MD  Cardiologist:  Shelva Majestic, MD   6 month F/U follow-up evaluation  History of Present Illness:  Barbara [name] is a 80 y.o. female who has CAD and in November 2000 underwent stenting of a high-grade LAD stenosis with an [id number] 3.518 mm bare-metal stent. A nuclear perfusion study in November 2013 was unchanged from previously and continued to show normal perfusion without scar or ischemia. Ejection fraction was 68%. An echo Doppler study revealed an ejection fraction in the 55-60% range with grade 1 diastolic dysfunction. 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  Additional problems include hypertension as well as hyperlipidemia. Over the past several months she has noticed that she is more tired. She also has noticed more shortness of breath with activity. She has noted some vague indigestion symptoms. She admits to some mild ankle swelling. She has been taking Crestor 10 mg and denies myalgias. She has been on losartan HCT 100/25 as well as amlodipine 10 mg and Lasix 20 mg for blood pressure and peripheral edema.  When I saw her in November 2015 her blood pressure was controlled. She was bradycardic not on any rate control medication and I raise the possibility of a component of chronotropic incompetence. To further evaluate her exertional dyspnea she underwent a nuclear perfusion study on 10/06/2014 which was normal. Post stress ejection fraction was 67%. An echo Doppler study done on 10/06/2014 showed an EF of 60-65% with moderate left ventricular hypertrophy. There was grade 1 diastolic dysfunction. She had indeterminate LV filling pressure. There was mild aortic sclerosis without stenosis, mitral annular calcification with mild MR, and mild  dilatation of her left atrium with mild tricuspid regurgitation. Pulmonary pressures were minimally elevated at 31 mm.  She underwent successful left knee surgery by Dr. Lindwood Qua in March 2016. She tolerated surgery well from a cardiovascular standpoint.  An echo Doppler study in March 2017 showed mild LVH with vigorous LV function with an EF of 65-70%. There was grade 2 diastolic dysfunction. There was moderate aortic sclerosis without stenosis. There was mild LA dilation. PA pressures were upper normal.  When Isaw her in May 2018, she had noticed issues with ankle swelling. She had stopped taking furosemide since his had expired. I elected to institute spironolactone with her moderate diastolic dysfunction. She was taking amlodipine 10 mg, losartan HCT 100/25 mg and she has been on spironolactone 25 mg daily.   At her evaluation in August 2018 she was in sinus rhythm and had bilateral ankle and feet swelling. I reduced her amlodipine to 5 mg and further titrated spironolactone to 25 mg twice a day. Recently she has been feeling well. She went to [place] over Christmas and did well. However, since 11/05/2017 she noticed her heart rate being a little faster and subsequently felt some irregularity. I   saw her in the office on 11/10/2017 and she was in atrial flutter with variable block. Her blood pressure was elevated. I started metoprolol 25 mg twice a day and started eliquis 5 mg twice a day. An echo Doppler study on January14,2019 showed an EF of 70-75% with grade 2 diastolic dysfunction. There was mild to moderate MR, moderately severe LA enlargement, and PA pressure was increased at 53 mm. She was hospitalized and  underwent Tikosyn loading with Dr. Rayann Heman, which ultimately pharmacologically cardioverted her back to sinus rhythm. She was seen in follow-up in the atrial fibrillation clinic on 12/01/2017. At that time, she was maintaining sinus rhythm and her QT interval  was stable.   I saw her on December 03, 2017 at which time she was doing well. Subsequently she has seen Dr. Rayann Heman. Has had issues with hoarseness and was referred to Dr. Redmond Baseman for ENT evaluation. 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 She is scheduled to undergo a CT of the soft tissue of her neck with contrast on January 20, 2018. She was recently notified that her valsartan has a contaminant. Shedenies any awareness of recurrent atrial fibrillation. She denies significant swelling. She has been maintained on furosemide 40 mg daily, losartan 50 mg for hypertension. She denies bleeding on Eliquis 5 mg twice daily. She continues to be on rosuvastatin for hyperlipidemia. She is on Tikosyn 125 mcg twice a day.   I saw her in the office in March 2019, she has continued to do well.  Due to concerns for obstructive sleep apnea she was referred for a sleep study.  This was done in December 2019 revealed severe sleep apnea with an AHI at 37/h, with REM sleep AHI at 52/h in supine sleep AHI at 47/h.  She had significant oxygen desaturation to a nadir of 77%.  CPAP was instituted and she was titrated up to 11 cm.  She was started on CPAP therapy in 2020 with [facility] as her DME company.  A most recent download from April 7 through Mar 10, 2019 shows that she is meeting compliance standards with 90% of usage days.  She is averaging 6 hours and 42 minutes of usage per night.  At 10 cm water pressure AHI is excellent at 2.9.  There is no leak.  She now has nasal pillows, but previously it had a full facemask which she did not tolerate.  She developed episodes of chest pain and increasing shortness of breath leading to her hospitalization in April 2020.  Was mild troponin elevation and she had inferolateral T wave abnormalities.  She underwent cardiac catheterization.  The results are as shown below and she was found to have nonobstructive CAD with a widely patent previously placed  LAD stent and mild concomitant CAD.  She had LVH with a "Spade" like ventricle.  She was last evaluated by me on Mar 11, 2019 in a telemedicine visit.  At that time she denied any chest pain but admitted to some shortness of breath.  She was sleeping better.  However she was still not sleeping long and often only averaging between 6 to 7 hours of sleep per night which resulted in some fatigue during the day.  She was unaware of breakthrough snoring.  A download was obtained which showed an AHI of 2.9 at 10 cm set pressure.  She was evaluated by Charyl Dancer in December 2020 in our office.  She continued to do well without chest pain.  Her blood pressure was elevated.  She was maintaining sinus rhythm on Tikosyn 125 mg twice a day as well as Eliquis 5 mg twice a day.  ECG showed sinus bradycardia at 54 bpm.  I  saw her Mar 31, 2020.  Prior to that evaluation she had undergone  shoulder surgery on Mar 23, 2020 the right reverse shoulder arthroplasty by Dr. Stann Mainland for advanced rotator cuff arthropathy and multiple tears.  His surgery well from a cardiac standpoint.  Since surgery she has been in the sling and during this time has not been able to use CPAP.  She will be having a follow-up orthopedic visit over the next several days and hopefully the sling will be taken off and CPAP can be reinstituted.  She denied any recent chest tightness or pressure.  She was unaware of palpitations.  Her last evaluation with me was in September 2021.  Over the last several months, she felt well.  She has gained mobility back to her right shoulder.  She again has resumed CPAP therapy.  A download was obtained from August 2 through July 04, 2020.  She had undergone an echo Doppler study which showed hyperdynamic LV function with ejection fraction at 70 to 75%.  There was mild aortic sclerosis without stenosis.  Estimated RV systolic pressure was mildly increased at 35.9.  She had apical hypertrophy with obliteration of  LV cavity in systole and apex suggestive for apical hypertrophic cardiomyopathy.  She was without chest pain or shortness of breath.   She apparently was hospitalized in December 2021 with atrial fibrillation with RVR.  She was restarted on Eliquis.  She was felt to have demand ischemia.  Tikosyn was restarted.  She was seen by EP who recommended she wear a Zio patch which revealed frequent PACs, nonsustained atrial tachycardia and a disorganized atrial activity without recurrent atrial fibrillation.  She was seen by Jory Sims, NP in January 2022.  She was caring for her husband and was not using her CPAP.  Presently, she denies any chest pain or awareness of recurrent A. fib.  Her husband was apparently hospitalized yesterday.  She has not been consistently using CPAP for long duration since oftentimes she was caring for her husband from 1 AM to 5 AM prior to his hospitalization.  However we did obtain a download in the office from February 14 through January 16, 2021.  Usage was 70% with average usage 6 hours and 22 minutes.  At a 10 cm set pressure, AHI was 2.1.  She has continued to be on amlodipine 5 mg, candesartan 16 mg, and spironolactone 25 mg for hypertension.  She is tolerating Tikosyn 125 mg twice a day.  She is on rosuvastatin for hyperlipidemia.  She denies bleeding on Eliquis.  Past Medical History:  Diagnosis Date  . A-fib ([place])   . Allergy   . CAD (coronary artery disease)    sees Dr. Shelva Majestic  cardiac stents - 2000  . CHF (congestive heart failure) ([place])   . Colon polyps   . Complication of anesthesia    rash/hives with "caines"  . Dyspnea    02/12/18 " when my heart gets out of rhythm, it has not been out of rhythym- since I have been on Tikosyn (11/2017)  . Dysrhythmia    afib fib  . GERD (gastroesophageal reflux disease)    takes OTC- Omeprazole- prn  . Heart murmur   . History of stress test    show normal perfusion without scar or ischemia, post EF 68%  . Hx of  echocardiogram    show an EF 55%-60% range with grade 1 diastolic dysfunction, she had mitral anular calcification with mild MR, moderate LA dilation and mild pulmonary hypertension with a PA estimated pressure of 70m  . Hyperlipidemia   . Hypertension   . NSTEMI (non-ST elevated myocardial infarction) ([place]   . Osteoarthritis   . Pneumonia  hx of 2015   . PONV (postoperative nausea and vomiting)     Past Surgical History:  Procedure Laterality Date  . CARDIAC CATHETERIZATION     11/2017  . cardiac stents  2000  . COLONOSCOPY  01-05-14   per Dr. Teena Irani, clear, no repeats needed   . COLONOSCOPY    . CORONARY STENT PLACEMENT  2000   in LAD  . DIRECT LARYNGOSCOPY WITH RADIAESSE INJECTION N/A 02/13/2018   Procedure: DIRECT LARYNGOSCOPY WITH RADIAESSE INJECTION;  Surgeon: Melida Quitter, MD;  Location: [facility];  Service: ENT;  Laterality: N/A;  . EYE SURGERY Left    CATARACT REMOVAL  . KNEE ARTHROSCOPY Left 01/06/2015   Procedure: LEFT KNEE ARTHROSCOPY, abrasion chondroplasty of the medial femerol condryl,medial and lateral menisectomy, microfracture , synovectomy of the suprpatellar pouch;  Surgeon: Latanya Maudlin, MD;  Location: [facility];  Service: Orthopedics;  Laterality: Left;  . LEFT HEART CATH AND CORONARY ANGIOGRAPHY N/A 02/26/2019   Procedure: LEFT HEART CATH AND CORONARY ANGIOGRAPHY;  Surgeon: Troy Sine, MD;  Location: [facility];  Service: Cardiovascular;  Laterality: N/A;  . MICROLARYNGOSCOPY W/VOCAL CORD INJECTION N/A 08/07/2018   Procedure: MICROLARYNGOSCOPY WITH VOCAL CORD INJECTION OF PROLARYN;  Surgeon: Melida Quitter, MD;  Location: [facility];  Service: ENT;  Laterality: N/A;  JET VENTILATION  . REVERSE SHOULDER ARTHROPLASTY Right 03/23/2020   Procedure: REVERSE SHOULDER ARTHROPLASTY;  Surgeon: Nicholes Stairs, MD;  Location: [facility];  Service: Orthopedics;  Laterality: Right;  Marland Kitchen VAGINAL HYSTERECTOMY  1971    Current Medications: Outpatient Medications Prior to  Visit  Medication Sig Dispense Refill  . acetaminophen (TYLENOL) 500 MG tablet Take 1,000 mg by mouth at bedtime.    Marland Kitchen amLODipine (NORVASC) 5 MG tablet Take 1 tablet (5 mg total) by mouth daily. 30 tablet 3  . amoxicillin (AMOXIL) 500 MG tablet Take 2,000 mg by mouth See admin instructions. Take 4 tablets (2000 mg) by mouth one hour prior to dental appointments    . apixaban (ELIQUIS) 5 MG TABS tablet Take 1 tablet (5 mg total) by mouth 2 (two) times daily. 180 tablet 1  . candesartan (ATACAND) 16 MG tablet Take 1 tablet (16 mg total) by mouth daily. 90 tablet 3  . Cholecalciferol (VITAMIN D3) 2000 units TABS Take 2,000 Units by mouth daily.    Marland Kitchen dextromethorphan (DELSYM) 30 MG/5ML liquid Take 15 mg by mouth daily as needed for cough.     . diclofenac Sodium (VOLTAREN) 1 % GEL Apply 1 application topically 4 (four) times daily as needed (knee pain).     Marland Kitchen dofetilide (TIKOSYN) 125 MCG capsule Take 1 capsule (125 mcg total) by mouth 2 (two) times daily. 180 capsule 1  . hypromellose (VISTA GEL DRY EYE RELIEF) 0.3 % GEL ophthalmic ointment Place 1 application into both eyes at bedtime as needed for dry eyes.     Marland Kitchen ipratropium (ATROVENT) 0.06 % nasal spray Place 2 sprays into both nostrils daily as needed for rhinitis.   5  . ketotifen (ZADITOR) 0.025 % ophthalmic solution Place 1 drop into both eyes 2 (two) times daily as needed (eye irritation/allergy).     Marland Kitchen loratadine (CLARITIN) 10 MG tablet Take 10 mg by mouth daily as needed (allergies/sinus issues).     . Multiple Vitamin (MULTIVITAMIN WITH MINERALS) TABS tablet Take 1 tablet by mouth daily.    . nitroGLYCERIN (NITROSTAT) 0.4 MG SL tablet Place 1 tablet (0.4 mg total) under the tongue every 5 (five)  minutes x 3 doses as needed for chest pain. 25 tablet 3  . pantoprazole (PROTONIX) 40 MG tablet Take 1 tablet (40 mg total) by mouth daily. 90 tablet 2  . PRESCRIPTION MEDICATION See admin instructions. CPAP- At bedtime    . rosuvastatin (CRESTOR)  40 MG tablet Take 1 tablet (40 mg total) by mouth daily. (Patient taking differently: Take 40 mg by mouth at bedtime.) 90 tablet 3  . spironolactone (ALDACTONE) 25 MG tablet Take 1 tablet (25 mg total) by mouth daily. 90 tablet 3  . triamcinolone (NASACORT) 55 MCG/ACT nasal inhaler Place 1 spray into both nostrils daily as needed (for allergies).      No facility-administered medications prior to visit.     Allergies:   Lidocaine, Morphine, Procaine hcl, Sulfonamide derivatives, Norco [hydrocodone-acetaminophen], Tramadol, Vytorin [ezetimibe-simvastatin], and Tape   Social History   Socioeconomic History  . Marital status: Married    Spouse name: Not on file  . Number of children: 2  . Years of education: Not on file  . Highest education level: Not on file  Occupational History  . Not on file  Tobacco Use  . Smoking status: Former Smoker    Years: 40.00    Quit date: 11/04/1996    Years since quitting: 24.2  . Smokeless tobacco: Never Used  Vaping Use  . Vaping Use: Never used  Substance and Sexual Activity  . Alcohol use: No    Alcohol/week: 0.0 standard drinks  . Drug use: No  . Sexual activity: Not on file  Other Topics Concern  . Not on file  Social History Narrative  . Not on file   Social Determinants of Health   Financial Resource Strain: Not on file  Food Insecurity: Not on file  Transportation Needs: Not on file  Physical Activity: Not on file  Stress: Stress Concern Present  . Feeling of Stress : Very much  Social Connections: Not on file     Family History:  The patient'sfamily history includes Cancer in her maternal grandmother; Heart disease in her maternal grandfather.   ROS General: Negative; No fevers, chills, or night sweats;  HEENT: Negative; No changes in vision or hearing, sinus congestion, difficulty swallowing Pulmonary: Negative; No cough, wheezing, shortness of breath, hemoptysis Cardiovascular: Negative; No chest pain, presyncope, syncope,  palpitations GI: Negative; No nausea, vomiting, diarrhea, or abdominal pain GU: Negative; No dysuria, hematuria, or difficulty voiding Musculoskeletal: Right shoulder surgery May 2021 Hematologic/Oncology: Negative; no easy bruising, bleeding Endocrine: Negative; no heat/cold intolerance; no diabetes Neuro: Negative; no changes in balance, headaches Skin: Negative; No rashes or skin lesions Psychiatric: Negative; No behavioral problems, depression Sleep: Negative; No snoring, daytime sleepiness, hypersomnolence, bruxism, restless legs, hypnogognic hallucinations, no cataplexy Other comprehensive 14 point system review is negative.   PHYSICAL EXAM:   VS:  BP 136/60   Pulse (!) 56   Ht _0  (1.727 m)   Wt 169 lb 3.2 oz (76.7 kg)   BMI 25.73 kg/m    Repeat blood pressure by me was 130/64  Wt Readings from Last 3 Encounters:  01/17/21 169 lb 3.2 oz (76.7 kg)  11/24/20 173 lb 6.4 oz (78.7 kg)  10/20/20 169 lb (76.7 kg)    General: Alert, oriented, no distress.  Skin: normal turgor, no rashes, warm and dry HEENT: Normocephalic, atraumatic. Pupils equal round and reactive to light; sclera anicteric; extraocular muscles intact;  Nose without nasal septal hypertrophy Mouth/Parynx benign; Mallinpatti scale 3 Neck: No JVD, no carotid bruits; normal  carotid upstroke Lungs: clear to ausculatation and percussion; no wheezing or rales Chest wall: without tenderness to palpitation Heart: PMI not displaced, RRR, s1 s2 normal, 2/6 systolic murmur, no diastolic murmur, no rubs, gallops, thrills, or heaves Abdomen: soft, nontender; no hepatosplenomehaly, BS+; abdominal aorta nontender and not dilated by palpation. Back: no CVA tenderness Pulses 2+ Musculoskeletal: full range of motion, normal strength, no joint deformities Extremities: no clubbing cyanosis or edema, Homan's sign negative  Neurologic: grossly nonfocal; Cranial nerves grossly wnl Psychologic: Normal mood and  affect    Studies/Labs Reviewed:   EKG:  EKG not ordered today.  ECG (independently read by me): Sinus bradycardia at 56, LVH with prior T wave abnormalities; QTc 441 msec  September 2021 ECG (independently read by me): Sinus bradycardia at 54; LVHwih T wave abnormality inferolaterally  I personally reviewed the ECG from October 26, 2019 which showed sinus bradycardia at 54 bpm, LVH with repolarization abnormality.  QTc interval was 398 ms.  Recent Labs: BMP Latest Ref Rng & Units 01/19/2021 10/20/2020 10/12/2020  Glucose 65 - 99 mg/dL 138(H) 104(H) 105(H)  BUN 8 - 27 mg/dL 27 29(H) 21  Creatinine 0.57 - 1.00 mg/dL 1.28(H) 1.50(H) 1.30(H)  BUN/Creat Ratio 12 - 28 21 - -  Sodium 134 - 144 mmol/L 139 139 138  Potassium 3.5 - 5.2 mmol/L 4.5 4.8 4.4  Chloride 96 - 106 mmol/L 102 107 105  CO2 20 - 29 mmol/L _0 Calcium 8.7 - 10.3 mg/dL 10.7(H) 10.4(H) 10.2     Hepatic Function Latest Ref Rng & Units 10/09/2020 10/08/2020 12/02/2019  Total Protein 6.5 - 8.1 g/dL 6.5 6.5 6.8  Albumin 3.5 - 5.0 g/dL 3.5 3.8 4.2  AST 15 - 41 U/L _1 ALT 0 - 44 U/L _2 Alk Phosphatase 38 - 126 U/L 66 57 68  Total Bilirubin 0.3 - 1.2 mg/dL 0.7 0.7 0.4  Bilirubin, Direct 0.0 - 0.3 mg/dL - - 0.1    CBC Latest Ref Rng & Units 10/12/2020 10/11/2020 10/10/2020  WBC 4.0 - 10.5 K/uL 5.1 5.0 5.1  Hemoglobin 12.0 - 15.0 g/dL 11.7(L) 11.7(L) 10.9(L)  Hematocrit 36.0 - 46.0 % 37.1 36.8 34.6(L)  Platelets 150 - 400 K/uL 198 183 171   Lab Results  Component Value Date   MCV 92.8 10/12/2020   MCV 92.9 10/11/2020   MCV 93.0 10/10/2020   Lab Results  Component Value Date   TSH 2.341 10/08/2020   Lab Results  Component Value Date   HGBA1C 5.7 (H) 10/08/2020     BNP    Component Value Date/Time   BNP 703.5 (H) 02/25/2019 1122    ProBNP No results found for: PROBNP   Lipid Panel     Component Value Date/Time   CHOL 111 10/08/2020 0313   TRIG 62 10/08/2020 0313   HDL 49 10/08/2020  0313   CHOLHDL 2.3 10/08/2020 0313   VLDL 12 10/08/2020 0313   LDLCALC 50 10/08/2020 0313     RADIOLOGY: No results found.   Additional studies/ records that were reviewed today include:   Sleep Study IMPRESSIONS: 10/07/2018 - Severe obstructive sleep apnea occurred during the diagnostic portion of the study (AHI 36.9/h; RDI 45.1/h); events were worse with supine position (AHI 46.8/h) and during REM sleep (AHI 52.2/h). CPAP was initiated at 5 cm and was titrated to optimal PAP pressure of 11 cm of water. - No significant central sleep apnea occurred during the diagnostic portion of the  study (CAI = 0.0/hour). - Severe oxygen desaturation during the diagnostic portion of the study to a nadir of 77%. - The patient snored with loud snoring volume during the diagnostic portion of the study. - No cardiac abnormalities were noted during this study. - Clinically significant periodic limb movements did not occur during sleep.  DIAGNOSIS - Obstructive Sleep Apnea (327.23 [G47.33 ICD-10])  RECOMMENDATIONS - Trial of CPAP therapy with EPR at 11 cm H2O with heated humidification. A Medium size Resmed Full Face Mask AirFit 20 for Her mask was used for the titration.  - Effort should be made to optimize nasal and oropharyngeal patency. - Avoid alcohol, sedatives and other CNS depressants that may worsen sleep apnea and disrupt normal sleep architecture. - Sleep hygiene should be reviewed to assess factors that may improve sleep quality. - Weight management and regular exercise should be initiated or continued. - Recommend a download in 30 days and sleep clinic evaluation after 4 weeks of therapy.  [Electronically signed] 10/18/2018 04:25 PM  Cath: 02/26/19   Prox RCA to Mid RCA lesion is 15% stenosed.  Ost LAD to Prox LAD lesion is 20% stenosed.  Prox Cx lesion is 20% stenosed.  Mild non-obstructive coronary artery disease with a patent proximal LAD stent with mild 20% intimal  hyperplasia (inserted 09/1999); 20% proximal left circumflex narrowing, and mild irregularity of 10 to 15% in the mid RCA.  Hyperdynamic LV function with an "Ace of Spade "configuration and near cavity obliteration in the mid to apical segment during systole. There is evidence for left ventricular hypertrophy. LVEDP is 16 mm.  RECOMMENDATION: Medical therapy with optimal blood pressure control, lipid management, and resumption of Eliquis tomorrow    ASSESSMENT:    1. CAD in native artery   2. Essential hypertension   3. PAF (paroxysmal atrial fibrillation) ([place])   4. OSA on CPAP   5. 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 Anticoagulated     PLAN:  1.  CAD: Cardiac catheterization from February 26, 2020 demonstrated patent LAD stent with mild nonobstructive CAD involving her circumflex and RCA.  She is not having any recurrent anginal symptomatology on current therapy.  2.  Exertional dyspnea: She has a history of exertional dyspnea and has documented LVH.  She has been demonstrated to have a  "spade-like ventricle" with near cavity obliteration in the mid to apical segment of her myocardium during systole.  There is evidence for diastolic dysfunction.  Echo revealed hyperdynamic LV function with EF at 70 to 66%, grade 2 diastolic dysfunction, and apical hypertrophy with obliteration of her LV cavity in systole at apex given the appearance of her "spade-like ventricle "catheterization.  Findings are consistent with probable apical hypertrophic cardiomyopathy  3. Obstructive sleep apnea: She previously was demonstrated to have severe sleep apnea.  She has been caring for her husband prior to him being hospitalized and oftentimes was up between 1 AM and 5 AM.  However, on her most recent download obtained today she is still meeting compliance standards.  At a 10 cm set pressure, AHI is 2.1 with average use of 6 hours and 22 minutes per  night.   4.  PAF: She was found to have recurrent atrial fibrillation with RVR in December 2021.  She is back on Tikosyn.  She is maintaining sinus rhythm.  No recurrent AF was noted on her Zio patch   5.  Eliquis anticoagulation: She continues to tolerate well  without bleeding  6.  Essential hypertension: Her blood pressure today is stable on her regimen consisting of amlodipine 5 mg, candesartan 16 mg and spironolactone 25 mg daily. There is no significant edema.  7.  Hyperlipidemia: She continues to be on rosuvastatin 40 mg.  LDL cholesterol in December 2021 was 50.  I will see her in 6 months for follow-up evaluation  Medication Adjustments/Labs and Tests Ordered: Current medicines are reviewed at length with the patient today.  Concerns regarding medicines are outlined above.  Medication changes, Labs and Tests ordered today are listed in the Patient Instructions below. Patient Instructions  Medication Instructions:  Continue current medications  *If you need a refill on your cardiac medications before your next appointment, please call your pharmacy*   Lab Work: None Ordered   Testing/Procedures: None Ordered   Follow-Up: At [facility], you and your health needs are our priority.  As part of our continuing mission to provide you with exceptional heart care, we have created designated Provider Care Teams.  These Care Teams include your primary Cardiologist (physician) and Advanced Practice Providers (APPs -  Physician Assistants and Nurse Practitioners) who all work together to provide you with the care you need, when you need it.  We recommend signing up for the patient portal called "MyChart".  Sign up information is provided on this After Visit Summary.  MyChart is used to connect with patients for Virtual Visits (Telemedicine).  Patients are able to view lab/test results, encounter notes, upcoming appointments, etc.  Non-urgent messages can be sent to your provider as  well.   To learn more about what you can do with MyChart, go to [email or web address].    Your next appointment:   6 month(s)  The format for your next appointment:   In Person  Provider:   You may see Shelva Majestic, MD or one of the following Advanced Practice Providers on your designated Care Team:    Almyra Deforest, PA-C  Fabian Sharp, PA-C or   Roby Lofts, [place]       Signed, Shelva Majestic, MD  01/22/2021 4:16 PM    [place] [facility] [address], [place], [place], [place]  [address] Phone: [phone]

## 2021-01-17 NOTE — Patient Instructions (Signed)
Medication Instructions:  Continue current medications  *If you need a refill on your cardiac medications before your next appointment, please call your pharmacy*   Lab Work: None Ordered   Testing/Procedures: None Ordered   Follow-Up: At [facility], you and your health needs are our priority.  As part of our continuing mission to provide you with exceptional heart care, we have created designated Provider Care Teams.  These Care Teams include your primary Cardiologist (physician) and Advanced Practice Providers (APPs -  Physician Assistants and Nurse Practitioners) who all work together to provide you with the care you need, when you need it.  We recommend signing up for the patient portal called "MyChart".  Sign up information is provided on this After Visit Summary.  MyChart is used to connect with patients for Virtual Visits (Telemedicine).  Patients are able to view lab/test results, encounter notes, upcoming appointments, etc.  Non-urgent messages can be sent to your provider as well.   To learn more about what you can do with MyChart, go to [email or web address].    Your next appointment:   6 month(s)  The format for your next appointment:   In Person  Provider:   You may see Khamille Beynon Kelly, MD or one of the following Advanced Practice Providers on your designated Care Team:    Hao Meng, PA-C  Angela Duke, PA-C or   Krista Kroeger, PA-C     

## 2021-01-19 ENCOUNTER — Other Ambulatory Visit: Payer: Self-pay

## 2021-01-19 ENCOUNTER — Other Ambulatory Visit: Payer: Medicare Other | Admitting: *Deleted

## 2021-01-20 LAB — BASIC METABOLIC PANEL WITH GFR
BUN/Creatinine Ratio: 21 (ref 12–28)
BUN: 27 mg/dL (ref 8–27)
CO2: 20 mmol/L (ref 20–29)
Calcium: 10.7 mg/dL — ABNORMAL HIGH (ref 8.7–10.3)
Chloride: 102 mmol/L (ref 96–106)
Creatinine, Ser: 1.28 mg/dL — ABNORMAL HIGH (ref 0.57–1.00)
Glucose: 138 mg/dL — ABNORMAL HIGH (ref 65–99)
Potassium: 4.5 mmol/L (ref 3.5–5.2)
Sodium: 139 mmol/L (ref 134–144)
eGFR: 42 mL/min/1.73 — ABNORMAL LOW (ref >59)

## 2021-01-20 LAB — SPECIMEN STATUS REPORT

## 2021-01-22 ENCOUNTER — Encounter: Payer: Self-pay | Admitting: Cardiovascular Disease

## 2021-02-01 ENCOUNTER — Other Ambulatory Visit: Payer: Self-pay

## 2021-02-06 ENCOUNTER — Ambulatory Visit: Payer: Medicare Other | Admitting: Podiatry

## 2021-02-06 ENCOUNTER — Other Ambulatory Visit: Payer: Self-pay

## 2021-02-06 ENCOUNTER — Ambulatory Visit (INDEPENDENT_AMBULATORY_CARE_PROVIDER_SITE_OTHER): Payer: Medicare Other | Admitting: Podiatry

## 2021-02-06 ENCOUNTER — Encounter: Payer: Self-pay | Admitting: Podiatry

## 2021-02-06 DIAGNOSIS — B351 Tinea unguium: Secondary | ICD-10-CM | POA: Diagnosis not present

## 2021-02-06 DIAGNOSIS — M79674 Pain in right toe(s): Secondary | ICD-10-CM | POA: Diagnosis not present

## 2021-02-06 DIAGNOSIS — M79675 Pain in left toe(s): Secondary | ICD-10-CM

## 2021-02-07 ENCOUNTER — Other Ambulatory Visit: Payer: Self-pay

## 2021-02-07 ENCOUNTER — Ambulatory Visit: Payer: Medicare Other

## 2021-02-07 DIAGNOSIS — Z0000 Encounter for general adult medical examination without abnormal findings: Secondary | ICD-10-CM

## 2021-02-07 DIAGNOSIS — G5601 Carpal tunnel syndrome, right upper limb: Secondary | ICD-10-CM | POA: Insufficient documentation

## 2021-02-08 LAB — PTH, INTACT AND CALCIUM
Calcium: 10.2 mg/dL (ref 8.7–10.3)
PTH: 37 pg/mL (ref 15–65)

## 2021-02-11 NOTE — Progress Notes (Signed)
Subjective: Barbara [name] is a pleasant 80 y.o. female patient seen today painful thick toenails that are difficult to trim. Pain interferes with ambulation. Aggravating factors include wearing enclosed shoe gear. Pain is relieved with periodic professional debridement.   Patient states her husband fell recently so she had to cancel his appointment with [place].  She voices no new pedal problems on today's visit.  Allergies  Allergen Reactions  . Lidocaine Anaphylaxis  . Morphine Other (See Comments)    "Body shuts down"  . Procaine Hcl Anaphylaxis, Rash and Other (See Comments)    "Anything with 'caine' in it "  . Sulfonamide Derivatives Hives  . Norco [Hydrocodone-Acetaminophen] Other (See Comments)    "Made me sick"  . Tramadol Nausea Only  . Vytorin [Ezetimibe-Simvastatin] Other (See Comments)    Unknown  . Tape Itching and Other (See Comments)    Patient prefers either paper tape or Coban wrap   Objective: Physical Exam  General: [name] is a pleasant 80 y.o. African American female, in NAD. AAO x 3.   Vascular:  Neurovascular status unchanged b/l lower extremities. Capillary refill time to digits immediate b/l. Palpable pedal pulses b/l LE. Pedal hair sparse. Lower extremity skin temperature gradient within normal limits. Nonpitting edema noted b/l lower extremities.  Dermatological:  Pedal skin with normal turgor, texture and tone bilaterally. No open wounds bilaterally. No interdigital macerations bilaterally. Toenails 1-5 b/l elongated, discolored, dystrophic, thickened, crumbly with subungual debris and tenderness to dorsal palpation.  Musculoskeletal:  Normal muscle strength 5/5 to all lower extremity muscle groups bilaterally. No pain crepitus or joint limitation noted with ROM b/l. No gross bony deformities bilaterally. Utilizes cane for ambulation assistance.  Neurological:  Protective sensation intact 5/5 intact bilaterally with 10g monofilament  b/l. Vibratory sensation intact b/l. Proprioception intact bilaterally.  Assessment and Plan:  1. Pain due to onychomycosis of toenails of both feet    -Examined patient. -No new findings. No new orders. -Patient to continue soft, supportive shoe gear daily. -Toenails 1-5 b/l were debrided in length and girth with sterile nail nippers and dremel without iatrogenic bleeding.  -Patient/POA to call should there be question/concern in the interim.  Return in about 3 months (around 05/08/2021).  Marzetta Board, DPM

## 2021-02-13 ENCOUNTER — Telehealth: Payer: Self-pay | Admitting: Cardiovascular Disease

## 2021-02-13 MED ORDER — AMLODIPINE BESYLATE 5 MG PO TABS: 5.0000 mg | ORAL_TABLET | Freq: Every day | ORAL | 1 refills | Status: DC

## 2021-02-13 NOTE — Telephone Encounter (Signed)
*  STAT* If patient is at the pharmacy, call can be transferred to refill team.   1. Which medications need to be refilled? (please list name of each medication and dose if known) amLODipine (NORVASC) 5 MG tablet  2. Which pharmacy/location (including street and city if local pharmacy) is medication to be sent to? [facility], [address]  3. Do they need a 30 day or 90 day supply? 30 day   Patient is out of medication

## 2021-03-01 ENCOUNTER — Encounter: Payer: Self-pay | Admitting: Family Medicine

## 2021-03-01 NOTE — Telephone Encounter (Signed)
The result would either say "detected" which positive, or "not detected" which is negative. She can double check the emails. I cannot find these results in her chart. If she were to be positive, there is not much to do do other than rest and let it resolve on its own (quarantine for 5 days)

## 2021-03-08 DIAGNOSIS — G5620 Lesion of ulnar nerve, unspecified upper limb: Secondary | ICD-10-CM | POA: Insufficient documentation

## 2021-03-15 ENCOUNTER — Other Ambulatory Visit: Payer: Self-pay | Admitting: Cardiovascular Disease

## 2021-03-19 ENCOUNTER — Other Ambulatory Visit: Payer: Self-pay | Admitting: Cardiovascular Disease

## 2021-04-02 ENCOUNTER — Other Ambulatory Visit: Payer: Self-pay | Admitting: Cardiovascular Disease

## 2021-05-10 ENCOUNTER — Telehealth: Payer: Self-pay

## 2021-05-10 DIAGNOSIS — M65312 Trigger thumb, left thumb: Secondary | ICD-10-CM | POA: Insufficient documentation

## 2021-05-10 DIAGNOSIS — M65321 Trigger finger, right index finger: Secondary | ICD-10-CM | POA: Insufficient documentation

## 2021-05-10 MED ORDER — APIXABAN 5 MG PO TABS: 5.0000 mg | ORAL_TABLET | Freq: Two times a day (BID) | ORAL | 1 refills | Status: DC

## 2021-05-10 NOTE — Telephone Encounter (Signed)
27f, 76.7kg, scr 1.28 01/19/21, lovw/th kelly 01/17/21, ccr 41

## 2021-05-11 ENCOUNTER — Telehealth: Payer: Medicare Other

## 2021-05-11 NOTE — Telephone Encounter (Signed)
**Note De-identified  Obfuscation** -----  **Note De-Identified  Obfuscation** Message from Silverio Lay, RN sent at 05/10/2021  2:46 PM EDT ----- Regarding: PA Tikosyn Received fax from [place] request PA for Tikosyn   MD: Dr. Claiborne Billings  Thank you!

## 2021-05-11 NOTE — Telephone Encounter (Signed)
**Note De-Identified  Obfuscation** Dofetilide PA started through covermymeds. Key: [id number]

## 2021-05-15 ENCOUNTER — Other Ambulatory Visit: Payer: Self-pay | Admitting: Cardiovascular Disease

## 2021-05-18 ENCOUNTER — Other Ambulatory Visit: Payer: Self-pay

## 2021-05-18 ENCOUNTER — Encounter: Payer: Self-pay | Admitting: Podiatry

## 2021-05-18 ENCOUNTER — Telehealth: Payer: Self-pay | Admitting: Cardiovascular Disease

## 2021-05-18 ENCOUNTER — Ambulatory Visit (INDEPENDENT_AMBULATORY_CARE_PROVIDER_SITE_OTHER): Payer: Medicare Other | Admitting: Podiatry

## 2021-05-18 ENCOUNTER — Ambulatory Visit
Admission: RE | Admit: 2021-05-18 | Discharge: 2021-05-18 | Disposition: A | Discharge status: Home / Self Care | Payer: Medicare Other | Source: Ambulatory Visit | Attending: Urgent Care | Admitting: Urgent Care

## 2021-05-18 VITALS — BP 148/68 | HR 57 | Temp 98.0°F | Resp 18

## 2021-05-18 DIAGNOSIS — M79675 Pain in left toe(s): Secondary | ICD-10-CM | POA: Diagnosis not present

## 2021-05-18 DIAGNOSIS — M79674 Pain in right toe(s): Secondary | ICD-10-CM | POA: Diagnosis not present

## 2021-05-18 DIAGNOSIS — B351 Tinea unguium: Secondary | ICD-10-CM | POA: Diagnosis not present

## 2021-05-18 DIAGNOSIS — N3001 Acute cystitis with hematuria: Secondary | ICD-10-CM | POA: Insufficient documentation

## 2021-05-18 LAB — POCT URINALYSIS DIP (MANUAL ENTRY)
Bilirubin, UA: NEGATIVE
Glucose, UA: NEGATIVE mg/dL
Ketones, POC UA: NEGATIVE mg/dL
Nitrite, UA: NEGATIVE
Protein Ur, POC: NEGATIVE mg/dL
Spec Grav, UA: 1.015 (ref 1.010–1.025)
Urobilinogen, UA: 0.2 E.U./dL
pH, UA: 5.5 (ref 5.0–8.0)

## 2021-05-18 MED ORDER — DOFETILIDE 125 MCG PO CAPS: 125.0000 ug | ORAL_CAPSULE | Freq: Two times a day (BID) | ORAL | 2 refills | Status: DC

## 2021-05-18 MED ORDER — CEPHALEXIN 500 MG PO CAPS: 500.0000 mg | ORAL_CAPSULE | Freq: Two times a day (BID) | ORAL | 0 refills | Status: DC

## 2021-05-18 NOTE — Discharge Instructions (Addendum)
Make sure you hydrate very well with plain water.  Please limit drinks that are considered urinary irritants such as soda, sweet tea, coffee, energy drinks, alcohol.  These can worsen your urinary and genital symptoms but also be the source of them.  I will let you know about your urine culture results through MyChart to see if we need to prescribe or change your antibiotics based off of those results.

## 2021-05-18 NOTE — Telephone Encounter (Signed)
*  STAT* If patient is at the pharmacy, call can be transferred to refill team.   1. Which medications need to be refilled? (please list name of each medication and dose if known)  dofetilide (TIKOSYN) 125 MCG capsule  2. Which pharmacy/location (including street and city if local pharmacy) is medication to be sent to? [facility] [place] [facility] Portal to [facility] Sites  3. Do they need a 30 day or 90 day supply?  90 day supply  Per Sophie with [facility], the medication is approved through August, 2022. She is hoping the patient can have it refilled soon. If questions, please return call to [facility] at [phone].

## 2021-05-18 NOTE — ED Triage Notes (Signed)
Pt states her urine was very dark on Wednesday and d/t being on blood thinners her PCP sent her here. Pt c/o urinary frequency yesterday.

## 2021-05-18 NOTE — Addendum Note (Signed)
Addended by: Alvin Critchley on: 05/18/2021 04:11 PM   Modules accepted: Orders

## 2021-05-18 NOTE — Telephone Encounter (Signed)
**Note De-Identified  Obfuscation** [name] Key: [id number] - PA Case ID: [id number] Outcome: Denied on July 8 Request Reference Number: [id number]. DOFETILIDE CAP 125MCG is denied for not meeting the prior authorization requirement(s). Details of this decision are in the notice attached below or have been faxed to you.  Drug: Dofetilide 125MCG capsules Form: [facility] Electronic Prior Authorization Form (2017 NCPDP)  Denial letter not received so I called [employer] and s/w Cy who states that they faxed the denial letter to [phone] on July 8th and that the pts dofetilide was denied as this medication is an exclusion from the pts plan of cover medications.  Cy states that she is faxing the denial letter to Attn: Jeani Hawking at [phone].  I will check with Dr Evette Georges nurse to ask if she may have received the fax.

## 2021-05-18 NOTE — ED Provider Notes (Signed)
[facility]   MRN: [id number] DOB: Oct 17, 1941  Subjective:   Barbara [name] is a 80 y.o. female presenting for 3-day history of acute onset dark urine, bloody urine, urinary frequency and urgency.  She discussed this with her PCP and was advised that she come in for an evaluation.  Denies fever, nausea, vomiting, abdominal or pelvic pain.  She does have a history of a vaginal hysterectomy.  Tries to hydrate well, has been drinking a lot of cranberry juice lately.  No current facility-administered medications for this encounter.  Current Outpatient Medications:    acetaminophen (TYLENOL) 500 MG tablet, Take 1,000 mg by mouth at bedtime., Disp: , Rfl:    amLODipine (NORVASC) 5 MG tablet, Take 1 tablet (5 mg total) by mouth daily., Disp: 90 tablet, Rfl: 1   amoxicillin (AMOXIL) 500 MG tablet, Take 2,000 mg by mouth See admin instructions. Take 4 tablets (2000 mg) by mouth one hour prior to dental appointments, Disp: , Rfl:    apixaban (ELIQUIS) 5 MG TABS tablet, Take 1 tablet (5 mg total) by mouth 2 (two) times daily., Disp: 180 tablet, Rfl: 1   candesartan (ATACAND) 16 MG tablet, TAKE 1 TABLET DAILY, Disp: 90 tablet, Rfl: 3   Cholecalciferol (VITAMIN D3) 2000 units TABS, Take 2,000 Units by mouth daily., Disp: , Rfl:    dextromethorphan (DELSYM) 30 MG/5ML liquid, Take 15 mg by mouth daily as needed for cough. , Disp: , Rfl:    diclofenac Sodium (VOLTAREN) 1 % GEL, Apply 1 application topically 4 (four) times daily as needed (knee pain). , Disp: , Rfl:    dofetilide (TIKOSYN) 125 MCG capsule, Take 1 capsule (125 mcg total) by mouth 2 (two) times daily., Disp: 180 capsule, Rfl: 1   hypromellose (VISTA GEL DRY EYE RELIEF) 0.3 % GEL ophthalmic ointment, Place 1 application into both eyes at bedtime as needed for dry eyes. , Disp: , Rfl:    ipratropium (ATROVENT) 0.06 % nasal spray, Place 2 sprays into both nostrils daily as needed for rhinitis. , Disp: , Rfl: 5   ketotifen  (ZADITOR) 0.025 % ophthalmic solution, Place 1 drop into both eyes 2 (two) times daily as needed (eye irritation/allergy). , Disp: , Rfl:    loratadine (CLARITIN) 10 MG tablet, Take 10 mg by mouth daily as needed (allergies/sinus issues). , Disp: , Rfl:    Multiple Vitamin (MULTIVITAMIN WITH MINERALS) TABS tablet, Take 1 tablet by mouth daily., Disp: , Rfl:    nitroGLYCERIN (NITROSTAT) 0.4 MG SL tablet, Place 1 tablet (0.4 mg total) under the tongue every 5 (five) minutes x 3 doses as needed for chest pain., Disp: 25 tablet, Rfl: 3   pantoprazole (PROTONIX) 40 MG tablet, TAKE 1 TABLET DAILY, Disp: 90 tablet, Rfl: 2   PRESCRIPTION MEDICATION, See admin instructions. CPAP- At bedtime, Disp: , Rfl:    rosuvastatin (CRESTOR) 40 MG tablet, TAKE 1 TABLET DAILY, Disp: 90 tablet, Rfl: 3   spironolactone (ALDACTONE) 25 MG tablet, TAKE 1 TABLET DAILY, Disp: 90 tablet, Rfl: 3   triamcinolone (NASACORT) 55 MCG/ACT nasal inhaler, Place 1 spray into both nostrils daily as needed (for allergies). , Disp: , Rfl:    Allergies  Allergen Reactions   Lidocaine Anaphylaxis   Morphine Other (See Comments)    "Body shuts down"   Procaine Hcl Anaphylaxis, Rash and Other (See Comments)    "Anything with 'caine' in it "   Sulfonamide Derivatives Hives   Norco [Hydrocodone-Acetaminophen] Other (See Comments)    "  Made me sick"   Tramadol Nausea Only   Vytorin [Ezetimibe-Simvastatin] Other (See Comments)    Unknown   Tape Itching and Other (See Comments)    Patient prefers either paper tape or Coban wrap    Past Medical History:  Diagnosis Date   A-fib ([place])    Allergy    CAD (coronary artery disease)    sees Dr. Shelva Majestic  cardiac stents - 2000   CHF (congestive heart failure) ([place])    Colon polyps    Complication of anesthesia    rash/hives with "caines"   Dyspnea    02/12/18 " when my heart gets out of rhythm, it has not been out of rhythym- since I have been on Tikosyn (11/2017)   Dysrhythmia    afib  fib   GERD (gastroesophageal reflux disease)    takes OTC- Omeprazole- prn   Heart murmur    History of stress test    show normal perfusion without scar or ischemia, post EF 68%   Hx of echocardiogram    show an EF 55%-60% range with grade 1 diastolic dysfunction, she had mitral anular calcification with mild MR, moderate LA dilation and mild pulmonary hypertension with a PA estimated pressure of 70mm   Hyperlipidemia    Hypertension    NSTEMI (non-ST elevated myocardial infarction) ([place])    Osteoarthritis    Pneumonia    hx of 2015    PONV (postoperative nausea and vomiting)      Past Surgical History:  Procedure Laterality Date   CARDIAC CATHETERIZATION     11/2017   cardiac stents  2000   COLONOSCOPY  01-05-14   per Dr. Teena Irani, clear, no repeats needed    COLONOSCOPY     CORONARY STENT PLACEMENT  2000   in LAD   DIRECT LARYNGOSCOPY WITH RADIAESSE INJECTION N/A 02/13/2018   Procedure: DIRECT LARYNGOSCOPY WITH RADIAESSE INJECTION;  Surgeon: Melida Quitter, MD;  Location: [facility];  Service: ENT;  Laterality: N/A;   EYE SURGERY Left    CATARACT REMOVAL   KNEE ARTHROSCOPY Left 01/06/2015   Procedure: LEFT KNEE ARTHROSCOPY, abrasion chondroplasty of the medial femerol condryl,medial and lateral menisectomy, microfracture , synovectomy of the suprpatellar pouch;  Surgeon: Latanya Maudlin, MD;  Location: [facility];  Service: Orthopedics;  Laterality: Left;   LEFT HEART CATH AND CORONARY ANGIOGRAPHY N/A 02/26/2019   Procedure: LEFT HEART CATH AND CORONARY ANGIOGRAPHY;  Surgeon: Troy Sine, MD;  Location: [facility];  Service: Cardiovascular;  Laterality: N/A;   MICROLARYNGOSCOPY W/VOCAL CORD INJECTION N/A 08/07/2018   Procedure: MICROLARYNGOSCOPY WITH VOCAL CORD INJECTION OF PROLARYN;  Surgeon: Melida Quitter, MD;  Location: [facility];  Service: ENT;  Laterality: N/A;  JET VENTILATION   REVERSE SHOULDER ARTHROPLASTY Right 03/23/2020   Procedure: REVERSE SHOULDER ARTHROPLASTY;  Surgeon:  Nicholes Stairs, MD;  Location: [facility];  Service: Orthopedics;  Laterality: Right;   VAGINAL HYSTERECTOMY  1971    Family History  Problem Relation Age of Onset   Cancer Maternal Grandmother    Heart disease Maternal Grandfather     Social History   Tobacco Use   Smoking status: Former    Years: 40.00    Types: Cigarettes    Quit date: 11/04/1996    Years since quitting: 24.5   Smokeless tobacco: Never  Vaping Use   Vaping Use: Never used  Substance Use Topics   Alcohol use: No    Alcohol/week: 0.0 standard drinks   Drug use:  No    ROS   Objective:   Vitals: BP (!) 148/68 (BP Location: Left Arm)   Pulse (!) 57   Temp 98 F (36.7 C) (Oral)   Resp 18   SpO2 98%   Physical Exam Constitutional:      General: She is not in acute distress.    Appearance: Normal appearance. She is well-developed. She is not ill-appearing, toxic-appearing or diaphoretic.  HENT:     Head: Normocephalic and atraumatic.     Nose: Nose normal.     Mouth/Throat:     Mouth: Mucous membranes are moist.     Pharynx: Oropharynx is clear.  Eyes:     General: No scleral icterus.    Extraocular Movements: Extraocular movements intact.     Pupils: Pupils are equal, round, and reactive to light.  Cardiovascular:     Rate and Rhythm: Normal rate.  Pulmonary:     Effort: Pulmonary effort is normal.  Abdominal:     General: There is no distension.     Palpations: There is no mass.     Tenderness: There is no abdominal tenderness. There is no right CVA tenderness, left CVA tenderness, guarding or rebound.  Skin:    General: Skin is warm and dry.  Neurological:     General: No focal deficit present.     Mental Status: She is alert and oriented to person, place, and time.  Psychiatric:        Mood and Affect: Mood normal.        Behavior: Behavior normal.        Thought Content: Thought content normal.        Judgment: Judgment normal.    Results for orders placed or performed during  the hospital encounter of 05/18/21 (from the past 24 hour(s))  POCT urinalysis dipstick     Status: Abnormal   Collection Time: 05/18/21 11:15 AM  Result Value Ref Range   Color, UA yellow yellow   Clarity, UA clear clear   Glucose, UA negative negative mg/dL   Bilirubin, UA negative negative   Ketones, POC UA negative negative mg/dL   Spec Grav, UA 1.015 1.010 - 1.025   Blood, UA large (A) negative   pH, UA 5.5 5.0 - 8.0   Protein Ur, POC negative negative mg/dL   Urobilinogen, UA 0.2 0.2 or 1.0 E.U./dL   Nitrite, UA Negative Negative   Leukocytes, UA Trace (A) Negative    Assessment and Plan :   PDMP not reviewed this encounter.  1. Acute cystitis with hematuria     Creatinine clearance calculated based off of the last creatinine level this year was 39.  Therefore we will use Keflex for acute cystitis with hematuria.  Recommend hydration with plain water as opposed to cranberry juice.  Urine culture pending. Counseled patient on potential for adverse effects with medications prescribed/recommended today, ER and return-to-clinic precautions discussed, patient verbalized understanding.    Jaynee Eagles, PA-C 05/18/21 1138

## 2021-05-19 LAB — URINE CULTURE: Culture: NO GROWTH

## 2021-05-20 NOTE — Progress Notes (Signed)
Subjective: Barbara [name] is a pleasant 80 y.o. female patient seen today painful thick toenails that are difficult to trim. Pain interferes with ambulation. Aggravating factors include wearing enclosed shoe gear. Pain is relieved with periodic professional debridement.  She states she had to place her husband in an ALF due to his declining health and repeated falls.  Allergies  Allergen Reactions   Lidocaine Anaphylaxis   Morphine Other (See Comments)    "Body shuts down"   Procaine Hcl Anaphylaxis, Rash and Other (See Comments)    "Anything with 'caine' in it "   Sulfonamide Derivatives Hives   Norco [Hydrocodone-Acetaminophen] Other (See Comments)    "Made me sick"   Tramadol Nausea Only   Vytorin [Ezetimibe-Simvastatin] Other (See Comments)    Unknown   Tape Itching and Other (See Comments)    Patient prefers either paper tape or Coban wrap    Objective: Physical Exam  General: Barbara [name] is a pleasant 80 y.o. African American female, WD, WN in NAD. AAO x 3.   Vascular:  Capillary refill time to digits immediate b/l. Palpable pedal pulses b/l LE. Pedal hair sparse. Lower extremity skin temperature gradient within normal limits. No pain with calf compression b/l. Nonpitting edema noted b/l lower extremities.  Dermatological:  Pedal skin with normal turgor, texture and tone b/l lower extremities. No open wounds b/l lower extremities. No interdigital macerations b/l lower extremities. Toenails 1-5 b/l elongated, discolored, dystrophic, thickened, crumbly with subungual debris and tenderness to dorsal palpation.  Musculoskeletal:  Normal muscle strength 5/5 to all lower extremity muscle groups bilaterally. No pain crepitus or joint limitation noted with ROM b/l. No gross bony deformities bilaterally. Utilizes cane for ambulation assistance.  Neurological:  Protective sensation intact 5/5 intact bilaterally with 10g monofilament b/l. Vibratory sensation intact  b/l.  Assessment and Plan:  1. Pain due to onychomycosis of toenails of both feet     -No new findings. No new orders. -Patient to continue soft, supportive shoe gear daily. -Toenails 1-5 b/l were debrided in length and girth with sterile nail nippers and dremel without iatrogenic bleeding.  -Patient to report any pedal injuries to medical professional immediately. -Patient/POA to call should there be question/concern in the interim.  Return in about 3 months (around 08/18/2021).  Marzetta Board, DPM

## 2021-05-21 NOTE — Telephone Encounter (Signed)
**Note De-Identified  Obfuscation** I have written an appeal letter for the pts Dofetilide and have emailed it to Dr Evette Georges nurse so she can fax it to [employer] at the fax number written on the cover letter included.

## 2021-05-24 NOTE — Telephone Encounter (Signed)
This RN attempted to fax letter twice, once on 7/18 and once on 7/19. Both attempts failed.

## 2021-05-31 NOTE — Telephone Encounter (Signed)
Called [employer] regarding correct number to fax appeal letter. Nurse was informed PA would need to be submitted through [place].  Will forward to PA department

## 2021-06-01 ENCOUNTER — Other Ambulatory Visit: Payer: Self-pay

## 2021-06-01 MED ORDER — DOFETILIDE 125 MCG PO CAPS: 125.0000 ug | ORAL_CAPSULE | Freq: Two times a day (BID) | ORAL | 2 refills | Status: DC

## 2021-06-01 NOTE — Telephone Encounter (Addendum)
**Note De-Identified  Obfuscation** I called  [facility] and s/w Jayron who advised me that they have no account under this pts name , DOB, address, or ID. Per Kennon Portela a Dofetilide refill was e-scribed to them from [place] on 7/15 to fill but since the pt has no account with them that maybe why they reached out to [place].  Per the pt she has 10 days of her Dofetilide left on hand. She states that she does not get her Dofetilide from [facility] and states that it is the only one of her medications that needs to be sent to [facility].  I have e-scribed the pts [id number] with 2 [facility]. The patient thanked me for calling her to discuss.

## 2021-06-06 ENCOUNTER — Other Ambulatory Visit: Payer: Self-pay

## 2021-06-06 ENCOUNTER — Ambulatory Visit (INDEPENDENT_AMBULATORY_CARE_PROVIDER_SITE_OTHER): Payer: Medicare Other

## 2021-06-06 DIAGNOSIS — Z0000 Encounter for general adult medical examination without abnormal findings: Secondary | ICD-10-CM | POA: Diagnosis not present

## 2021-06-06 NOTE — Progress Notes (Signed)
Virtual Visit via Telephone Note  I connected with  Barbara [name] on 06/06/21 at  3:15 PM EDT by telephone and verified that I am speaking with the correct person using two identifiers.  Medicare Annual Wellness visit completed telephonically due to Covid-19 pandemic.   Persons participating in this call: This Health Coach and this patient.   Location: Patient: Home  Provider: Office    I discussed the limitations, risks, security and privacy concerns of performing an evaluation and management service by telephone and the availability of in person appointments. The patient expressed understanding and agreed to proceed.  Unable to perform video visit due to video visit attempted and failed and/or patient does not have video capability.   Some vital signs may be absent or patient reported.   Willette Brace, LPN   Subjective:   Barbara [name] is a 80 y.o. female who presents for an Initial Medicare Annual Wellness Visit.  Review of Systems     Cardiac Risk Factors include: advanced age (>47mn, >>71women);hypertension;dyslipidemia     Objective:    Today's Vitals   06/06/21 1513  PainSc: 9    There is no height or weight on file to calculate BMI.  Advanced Directives 06/06/2021 10/07/2020 05/17/2020 03/23/2020 03/15/2020 02/25/2019 02/25/2019  Does Patient Have a Medical Advance Directive? Yes No No No No Yes No  Type of Advance Directive [place] -  Does patient want to make changes to medical advance directive? - - - - - No - Patient declined -  Copy of [place]? - - - - - - -  Would patient like information on creating a medical advance directive? - No - Patient declined No - Patient declined No - Patient declined No - Patient declined - -    Current Medications (verified) Outpatient Encounter Medications as of 06/06/2021  Medication Sig   acetaminophen (TYLENOL) 500 MG tablet Take 1,000 mg  by mouth at bedtime. As needed   amLODipine (NORVASC) 5 MG tablet Take 1 tablet (5 mg total) by mouth daily.   apixaban (ELIQUIS) 5 MG TABS tablet Take 1 tablet (5 mg total) by mouth 2 (two) times daily.   candesartan (ATACAND) 16 MG tablet TAKE 1 TABLET DAILY   Cholecalciferol (VITAMIN D3) 2000 units TABS Take 2,000 Units by mouth daily.   dofetilide (TIKOSYN) 125 MCG capsule Take 1 capsule (125 mcg total) by mouth 2 (two) times daily.   hypromellose (VISTA GEL DRY EYE RELIEF) 0.3 % GEL ophthalmic ointment Place 1 application into both eyes at bedtime as needed for dry eyes.    ipratropium (ATROVENT) 0.06 % nasal spray Place 2 sprays into both nostrils daily as needed for rhinitis.    ketotifen (ZADITOR) 0.025 % ophthalmic solution Place 1 drop into both eyes 2 (two) times daily as needed (eye irritation/allergy).    loratadine (CLARITIN) 10 MG tablet Take 10 mg by mouth daily as needed (allergies/sinus issues).    Multiple Vitamin (MULTIVITAMIN WITH MINERALS) TABS tablet Take 1 tablet by mouth daily.   pantoprazole (PROTONIX) 40 MG tablet TAKE 1 TABLET DAILY   PRESCRIPTION MEDICATION See admin instructions. CPAP- At bedtime   rosuvastatin (CRESTOR) 40 MG tablet TAKE 1 TABLET DAILY   spironolactone (ALDACTONE) 25 MG tablet TAKE 1 TABLET DAILY   triamcinolone (NASACORT) 55 MCG/ACT nasal inhaler Place 1 spray into both nostrils daily as needed (for allergies).    amoxicillin (AMOXIL)  500 MG tablet Take 2,000 mg by mouth See admin instructions. Take 4 tablets (2000 mg) by mouth one hour prior to dental appointments (Patient not taking: Reported on 06/06/2021)   BINAXNOW COVID-19 AG HOME TEST KIT Use as Directed on the Package   COVID-19 At Home Test [facility] COVID-19 AG HOME TEST VI) BinaxNOW COVID-19 Ag Self Test kit  Use as Directed on the Package   dextromethorphan (DELSYM) 30 MG/5ML liquid Take 15 mg by mouth daily as needed for cough.  (Patient not taking: Reported on 06/06/2021)   diclofenac  Sodium (VOLTAREN) 1 % GEL Apply 1 application topically 4 (four) times daily as needed (knee pain).  (Patient not taking: Reported on 06/06/2021)   HYDROcodone bit-homatropine (HYCODAN) 5-1.5 MG/5ML syrup hydrocodone-homatropine 5 mg-1.5 mg/5 mL oral syrup (Patient not taking: Reported on 06/06/2021)   nitroGLYCERIN (NITROSTAT) 0.4 MG SL tablet Place 1 tablet (0.4 mg total) under the tongue every 5 (five) minutes x 3 doses as needed for chest pain. (Patient not taking: Reported on 06/06/2021)   [DISCONTINUED] cephALEXin (KEFLEX) 500 MG capsule Take 1 capsule (500 mg total) by mouth 2 (two) times daily. (Patient not taking: Reported on 06/06/2021)   No facility-administered encounter medications on file as of 06/06/2021.    Allergies (verified) Lidocaine, Morphine, Procaine hcl, Sulfonamide derivatives, Norco [hydrocodone-acetaminophen], Tramadol, Vytorin [ezetimibe-simvastatin], and Tape   History: Past Medical History:  Diagnosis Date   A-fib ([place])    Allergy    CAD (coronary artery disease)    sees Dr. Shelva Majestic  cardiac stents - 2000   CHF (congestive heart failure) ([place])    Colon polyps    Complication of anesthesia    rash/hives with "caines"   Dyspnea    02/12/18 " when my heart gets out of rhythm, it has not been out of rhythym- since I have been on Tikosyn (11/2017)   Dysrhythmia    afib fib   GERD (gastroesophageal reflux disease)    takes OTC- Omeprazole- prn   Heart murmur    History of stress test    show normal perfusion without scar or ischemia, post EF 68%   Hx of echocardiogram    show an EF 55%-60% range with grade 1 diastolic dysfunction, she had mitral anular calcification with mild MR, moderate LA dilation and mild pulmonary hypertension with a PA estimated pressure of 46m   Hyperlipidemia    Hypertension    [place]STEMI (non-ST elevated myocardial infarction) [place]    Osteoarthritis    Pneumonia    hx of 2015    PONV (postoperative nausea and vomiting)    Past Surgical  History:  Procedure Laterality Date   CARDIAC CATHETERIZATION     11/2017   cardiac stents  2000   COLONOSCOPY  01-05-14   per Dr. JTeena Irani clear, no repeats needed    COLONOSCOPY     CORONARY STENT PLACEMENT  2000   in [facility]   DIRECT LARYNGOSCOPY WITH RADIAESSE INJECTION N/A 02/13/2018   Procedure: DIRECT LARYNGOSCOPY WITH RADIAESSE INJECTION;  Surgeon: BMelida Quitter MD;  [facility]  Service: ENT;  Laterality: N/A;   EYE SURGERY Left    CATARACT REMOVAL   KNEE ARTHROSCOPY Left 01/06/2015   Procedure: LEFT KNEE ARTHROSCOPY, abrasion chondroplasty of the medial femerol condryl,medial and lateral menisectomy, microfracture , synovectomy of the suprpatellar pouch;  Surgeon: RLatanya Maudlin MD;  Location: [facility];  Service: Orthopedics;  Laterality: Left;   LEFT HEART CATH AND CORONARY ANGIOGRAPHY N/A 02/26/2019  Procedure: LEFT HEART CATH AND CORONARY ANGIOGRAPHY;  Surgeon: Troy Sine, MD;  Location: [facility];  Service: Cardiovascular;  Laterality: N/A;   MICROLARYNGOSCOPY W/VOCAL CORD INJECTION N/A 08/07/2018   Procedure: MICROLARYNGOSCOPY WITH VOCAL CORD INJECTION OF PROLARYN;  Surgeon: Melida Quitter, MD;  Location: [facility];  Service: ENT;  Laterality: N/A;  JET VENTILATION   REVERSE SHOULDER ARTHROPLASTY Right 03/23/2020   Procedure: REVERSE SHOULDER ARTHROPLASTY;  Surgeon: Nicholes Stairs, MD;  Location: [place];  Service: Orthopedics;  Laterality: Right;   VAGINAL HYSTERECTOMY  1971   Family History  Problem Relation Age of Onset   Cancer Maternal Grandmother    Heart disease Maternal Grandfather    Social History   Socioeconomic History   Marital status: Married    Spouse name: Not on file   Number of children: 2   Years of education: Not on file   Highest education level: Not on file  Occupational History   Not on file  Tobacco Use   Smoking status: Former    Years: 40.00    Types: Cigarettes    Quit date: 11/04/1996    Years since quitting: 24.6    Smokeless tobacco: Never  Vaping Use   Vaping Use: Never used  Substance and Sexual Activity   Alcohol use: No    Alcohol/week: 0.0 standard drinks   Drug use: No   Sexual activity: Not on file  Other Topics Concern   Not on file  Social History Narrative   Not on file   Social Determinants of Health   Financial Resource Strain: Low Risk    Difficulty of Paying Living Expenses: Not hard at all  Food Insecurity: No Food Insecurity   Worried About Charity fundraiser in the Last Year: Never true   [place] in the Last Year: Never true  Transportation Needs: No Transportation Needs   Lack of Transportation (Medical): No   Lack of Transportation (Non-Medical): No  Physical Activity: Inactive   Days of Exercise per Week: 0 days   Minutes of Exercise per Session: 0 min  Stress: No Stress Concern Present   Feeling of Stress : Only a little  Social Connections: Moderately Integrated   Frequency of Communication with Friends and Family: More than three times a week   Frequency of Social Gatherings with Friends and Family: More than three times a week   Attends Religious Services: More than 4 times per year   Active Member of [employer] or Organizations: No   Attends Music therapist: Never   Marital Status: Married    Tobacco Counseling Counseling given: Not Answered   Clinical Intake:  Pre-visit preparation completed: Yes  Pain : 0-10 Pain Score: 9  Pain Type: Chronic pain Pain Location: Knee Pain Orientation: Left Pain Descriptors / Indicators: Aching Pain Onset: More than a month ago Pain Frequency: Intermittent     BMI - recorded: 25.73 Nutritional Status: BMI 25 -29 Overweight Nutritional Risks: None Diabetes: No  How often do you need to have someone help you when you read instructions, pamphlets, or other written materials from your doctor or pharmacy?: 1 - Never  Diabetic?No  Interpreter Needed?: No  Information entered by :: Charlott Rakes, LPN   Activities of Daily Living In your present state of health, do you have any difficulty performing the following activities: 06/06/2021 10/08/2020  Hearing? N N  Vision? N N  Difficulty concentrating or making decisions? N N  Walking  or climbing stairs? N N  Dressing or bathing? N N  Doing errands, shopping? N Y  Conservation officer, nature and eating ? N -  Using the Toilet? N -  In the past six months, have you accidently leaked urine? N -  Do you have problems with loss of bowel control? N -  Managing your Medications? N -  Managing your Finances? N -  Housekeeping or managing your Housekeeping? N -  Some recent data might be hidden    Patient Care Team: Laurey Morale, MD as PCP - General Claiborne Billings Joyice Faster, MD as PCP - Cardiology (Cardiology)  Indicate any recent Medical Services you may have received from other than [facility] providers in the past year (date may be approximate).     Assessment:   This is a routine wellness examination for Barbara [name].  Hearing/Vision screen Hearing Screening - Comments:: Pt denies any hearing issues  Vision Screening - Comments:: Pt follow up with Dr Herbert Deaner for annual eye exams   Dietary issues and exercise activities discussed: Current Exercise Habits: The patient does not participate in regular exercise at present   Goals Addressed             This Visit's Progress    Patient Stated       Lose weight        Depression Screen PHQ 2/9 Scores 06/06/2021 11/19/2018 04/25/2016  PHQ - 2 Score 0 0 0    Fall Risk Fall Risk  06/06/2021 11/19/2018 04/25/2016  Falls in the past year? 0 0 No  Number falls in past yr: 0 - -  Injury with Fall? 0 - -  Risk for fall due to : Impaired vision;Impaired balance/gait - -  Follow up Falls prevention discussed - -    FALL RISK PREVENTION PERTAINING TO THE HOME:  Any stairs in or around the home? Yes  If so, are there any without handrails? No  Home free of loose throw rugs in walkways, pet beds,  electrical cords, etc? Yes  Adequate lighting in your home to reduce risk of falls? Yes   ASSISTIVE DEVICES UTILIZED TO PREVENT FALLS:  Life alert? Yes  Use of a cane, walker or w/c? Yes  Grab bars in the bathroom? Yes  Shower chair or bench in shower? Yes  Elevated toilet seat or a handicapped toilet? Yes   TIMED UP AND GO:  Was the test performed? No     Cognitive Function:     6CIT Screen 06/06/2021  What Year? 0 points  What month? 0 points  What time? 0 points  Count back from 20 0 points  Months in reverse 0 points  Repeat phrase 0 points  Total Score 0    Immunizations Immunization History  Administered Date(s) Administered   Fluad Quad(high Dose 65+) 08/04/2019, 10/31/2020   Influenza Split 10/25/2011, 10/21/2012   Influenza Whole 10/10/2009, 10/17/2010   Influenza, High Dose Seasonal PF 08/02/2015, 09/04/2016, 08/14/2017, 08/03/2018   Influenza,inj,Quad PF,6+ Mos 07/12/2013, 08/24/2014   Influenza-Unspecified 08/04/2014, 08/07/2015   PFIZER(Purple Top)SARS-COV-2 Vaccination 12/26/2019, 01/19/2020, 03/05/2021   Pneumococcal Conjugate-13 09/04/2015   Pneumococcal Polysaccharide-23 10/10/2009   Pneumococcal-Unspecified 08/07/2015   Tdap 11/06/2017    TDAP status: Up to date  Flu Vaccine status: Due, Education has been provided regarding the importance of this vaccine. Advised may receive this vaccine at local pharmacy or Health Dept. Aware to provide a copy of the vaccination record if obtained from local pharmacy or Health Dept. Verbalized acceptance and understanding.  Pneumococcal vaccine status: Up to date  Covid-19 vaccine status: Completed vaccines  Qualifies for Shingles Vaccine? Yes   Zostavax completed No   Shingrix Completed?: No.    Education has been provided regarding the importance of this vaccine. Patient has been advised to call insurance company to determine out of pocket expense if they have not yet received this vaccine. Advised may also  receive vaccine at local pharmacy or Health Dept. Verbalized acceptance and understanding.  Screening Tests Health Maintenance  Topic Date Due   Zoster Vaccines- Shingrix (1 of 2) Never done   COVID-19 Vaccine (4 - Booster for [employer] series) 06/05/2021   INFLUENZA VACCINE  06/04/2021   TETANUS/TDAP  11/07/2027   DEXA SCAN  Completed   PNA vac Low Risk Adult  Completed   HPV VACCINES  Aged Out    Health Maintenance  Health Maintenance Due  Topic Date Due   Zoster Vaccines- Shingrix (1 of 2) Never done   COVID-19 Vaccine (4 - Booster for [employer] series) 06/05/2021   INFLUENZA VACCINE  06/04/2021    Colorectal cancer screening: No longer required.   Mammogram status: Completed 09/15/20. Repeat every year  Bone Density status: Completed 12/09/11. Results reflect: Bone density results: NORMAL. Repeat every 2 years.  Additional Screening:   Vision Screening: Recommended annual ophthalmology exams for early detection of glaucoma and other disorders of the eye. Is the patient up to date with their annual eye exam?  Yes  Who is the provider or what is the name of the office in which the patient attends annual eye exams? Dr Herbert Deaner  If pt is not established with a provider, would they like to be referred to a provider to establish care? No .   Dental Screening: Recommended annual dental exams for proper oral hygiene  Community Resource Referral / Chronic Care Management: CRR required this visit?  No   CCM required this visit?  No      Plan:     I have personally reviewed and noted the following in the patient's chart:   Medical and social history Use of alcohol, tobacco or illicit drugs  Current medications and supplements including opioid prescriptions. Patient is not currently taking opioid prescriptions. Functional ability and status Nutritional status Physical activity Advanced directives List of other physicians Hospitalizations, surgeries, and ER visits in previous  12 months Vitals Screenings to include cognitive, depression, and falls Referrals and appointments  In addition, I have reviewed and discussed with patient certain preventive protocols, quality metrics, and best practice recommendations. A written personalized care plan for preventive services as well as general preventive health recommendations were provided to patient.     Willette Brace, LPN   04/12/6294   Nurse Notes: pt has been having headaches and stated that her B/p abnormal at times, appt has been made to evaluate 06/11/21 _0 :15

## 2021-06-06 NOTE — Patient Instructions (Signed)
Barbara [name] , Thank you for taking time to come for your Medicare Wellness Visit. I appreciate your ongoing commitment to your health goals. Please review the following plan we discussed and let me know if I can assist you in the future.   Screening recommendations/referrals: Colonoscopy: No Longer required  Mammogram: Done 09/15/20 Bone Density: Done 12/09/11 Recommended yearly ophthalmology/optometry visit for glaucoma screening and checkup Recommended yearly dental visit for hygiene and checkup  Vaccinations: Influenza vaccine: Due and discussed Pneumococcal vaccine: Completed  Tdap vaccine: Done 11/06/17 repeat in 10 years 11/17/27 Shingles vaccine: Shingrix discussed. Please contact your pharmacy for coverage information.    Covid-19:Completed 2/21, & 01/19/20  Advanced directives: Please bring a copy of your health care power of attorney and living will to the office at your convenience.  Conditions/risks identified: none at this time   Next appointment: Follow up in one year for your annual wellness visit    Preventive Care 65 Years and Older, Female Preventive care refers to lifestyle choices and visits with your health care provider that can promote health and wellness. What does preventive care include? A yearly physical exam. This is also called an annual well check. Dental exams once or twice a year. Routine eye exams. Ask your health care provider how often you should have your eyes checked. Personal lifestyle choices, including: Daily care of your teeth and gums. Regular physical activity. Eating a healthy diet. Avoiding tobacco and drug use. Limiting alcohol use. Practicing safe sex. Taking low-dose aspirin every day. Taking vitamin and mineral supplements as recommended by your health care provider. What happens during an annual well check? The services and screenings done by your health care provider during your annual well check will depend on your age, overall  health, lifestyle risk factors, and family history of disease. Counseling  Your health care provider may ask you questions about your: Alcohol use. Tobacco use. Drug use. Emotional well-being. Home and relationship well-being. Sexual activity. Eating habits. History of falls. Memory and ability to understand (cognition). Work and work Statistician. Reproductive health. Screening  You may have the following tests or measurements: Height, weight, and BMI. Blood pressure. Lipid and cholesterol levels. These may be checked every 5 years, or more frequently if you are over 86 years old. Skin check. Lung cancer screening. You may have this screening every year starting at age 25 if you have a 30-pack-year history of smoking and currently smoke or have quit within the past 15 years. Fecal occult blood test (FOBT) of the stool. You may have this test every year starting at age 85. Flexible sigmoidoscopy or colonoscopy. You may have a sigmoidoscopy every 5 years or a colonoscopy every 10 years starting at age 104. Hepatitis C blood test. Hepatitis B blood test. Sexually transmitted disease (STD) testing. Diabetes screening. This is done by checking your blood sugar (glucose) after you have not eaten for a while (fasting). You may have this done every 1-3 years. Bone density scan. This is done to screen for osteoporosis. You may have this done starting at age 67. Mammogram. This may be done every 1-2 years. Talk to your health care provider about how often you should have regular mammograms. Talk with your health care provider about your test results, treatment options, and if necessary, the need for more tests. Vaccines  Your health care provider may recommend certain vaccines, such as: Influenza vaccine. This is recommended every year. Tetanus, diphtheria, and acellular pertussis (Tdap, Td) vaccine. You may need a  Td booster every 10 years. Zoster vaccine. You may need this after age  90. Pneumococcal 13-valent conjugate (PCV13) vaccine. One dose is recommended after age 16. Pneumococcal polysaccharide (PPSV23) vaccine. One dose is recommended after age 69. Talk to your health care provider about which screenings and vaccines you need and how often you need them. This information is not intended to replace advice given to you by your health care provider. Make sure you discuss any questions you have with your health care provider. Document Released: 11/17/2015 Document Revised: 07/10/2016 Document Reviewed: 08/22/2015 Elsevier Interactive Patient Education  2017 [employer] Prevention in the Home Falls can cause injuries. They can happen to people of all ages. There are many things you can do to make your home safe and to help prevent falls. What can I do on the outside of my home? Regularly fix the edges of walkways and driveways and fix any cracks. Remove anything that might make you trip as you walk through a door, such as a raised step or threshold. Trim any bushes or trees on the path to your home. Use bright outdoor lighting. Clear any walking paths of anything that might make someone trip, such as rocks or tools. Regularly check to see if handrails are loose or broken. Make sure that both sides of any steps have handrails. Any raised decks and porches should have guardrails on the edges. Have any leaves, snow, or ice cleared regularly. Use sand or salt on walking paths during winter. Clean up any spills in your garage right away. This includes oil or grease spills. What can I do in the bathroom? Use night lights. Install grab bars by the toilet and in the tub and shower. Do not use towel bars as grab bars. Use non-skid mats or decals in the tub or shower. If you need to sit down in the shower, use a plastic, non-slip stool. Keep the floor dry. Clean up any water that spills on the floor as soon as it happens. Remove soap buildup in the tub or shower  regularly. Attach bath mats securely with double-sided non-slip rug tape. Do not have throw rugs and other things on the floor that can make you trip. What can I do in the bedroom? Use night lights. Make sure that you have a light by your bed that is easy to reach. Do not use any sheets or blankets that are too big for your bed. They should not hang down onto the floor. Have a firm chair that has side arms. You can use this for support while you get dressed. Do not have throw rugs and other things on the floor that can make you trip. What can I do in the kitchen? Clean up any spills right away. Avoid walking on wet floors. Keep items that you use a lot in easy-to-reach places. If you need to reach something above you, use a strong step stool that has a grab bar. Keep electrical cords out of the way. Do not use floor polish or wax that makes floors slippery. If you must use wax, use non-skid floor wax. Do not have throw rugs and other things on the floor that can make you trip. What can I do with my stairs? Do not leave any items on the stairs. Make sure that there are handrails on both sides of the stairs and use them. Fix handrails that are broken or loose. Make sure that handrails are as long as the stairways. Check any  carpeting to make sure that it is firmly attached to the stairs. Fix any carpet that is loose or worn. Avoid having throw rugs at the top or bottom of the stairs. If you do have throw rugs, attach them to the floor with carpet tape. Make sure that you have a light switch at the top of the stairs and the bottom of the stairs. If you do not have them, ask someone to add them for you. What else can I do to help prevent falls? Wear shoes that: Do not have high heels. Have rubber bottoms. Are comfortable and fit you well. Are closed at the toe. Do not wear sandals. If you use a stepladder: Make sure that it is fully opened. Do not climb a closed stepladder. Make sure that  both sides of the stepladder are locked into place. Ask someone to hold it for you, if possible. Clearly mark and make sure that you can see: Any grab bars or handrails. First and last steps. Where the edge of each step is. Use tools that help you move around (mobility aids) if they are needed. These include: Canes. Walkers. Scooters. Crutches. Turn on the lights when you go into a dark area. Replace any light bulbs as soon as they burn out. Set up your furniture so you have a clear path. Avoid moving your furniture around. If any of your floors are uneven, fix them. If there are any pets around you, be aware of where they are. Review your medicines with your doctor. Some medicines can make you feel dizzy. This can increase your chance of falling. Ask your doctor what other things that you can do to help prevent falls. This information is not intended to replace advice given to you by your health care provider. Make sure you discuss any questions you have with your health care provider. Document Released: 08/17/2009 Document Revised: 03/28/2016 Document Reviewed: 11/25/2014 Elsevier Interactive Patient Education  2017 [employer].

## 2021-06-11 ENCOUNTER — Encounter: Payer: Medicare Other | Admitting: Family Medicine

## 2021-06-12 ENCOUNTER — Other Ambulatory Visit: Payer: Self-pay

## 2021-06-13 ENCOUNTER — Ambulatory Visit (INDEPENDENT_AMBULATORY_CARE_PROVIDER_SITE_OTHER): Payer: Medicare Other | Admitting: Family Medicine

## 2021-06-13 ENCOUNTER — Encounter: Payer: Self-pay | Admitting: Family Medicine

## 2021-06-13 VITALS — BP 138/64 | HR 58 | Temp 98.4°F | Ht 68.0 in | Wt 159.0 lb

## 2021-06-13 DIAGNOSIS — R739 Hyperglycemia, unspecified: Secondary | ICD-10-CM | POA: Diagnosis not present

## 2021-06-13 DIAGNOSIS — E785 Hyperlipidemia, unspecified: Secondary | ICD-10-CM | POA: Diagnosis not present

## 2021-06-13 DIAGNOSIS — I4891 Unspecified atrial fibrillation: Secondary | ICD-10-CM

## 2021-06-13 DIAGNOSIS — I2510 Atherosclerotic heart disease of native coronary artery without angina pectoris: Secondary | ICD-10-CM

## 2021-06-13 DIAGNOSIS — I422 Other hypertrophic cardiomyopathy: Secondary | ICD-10-CM | POA: Diagnosis not present

## 2021-06-13 DIAGNOSIS — R42 Dizziness and giddiness: Secondary | ICD-10-CM

## 2021-06-13 DIAGNOSIS — I10 Essential (primary) hypertension: Secondary | ICD-10-CM

## 2021-06-13 DIAGNOSIS — N183 Chronic kidney disease, stage 3 unspecified: Secondary | ICD-10-CM

## 2021-06-13 DIAGNOSIS — I509 Heart failure, unspecified: Secondary | ICD-10-CM

## 2021-06-13 LAB — LIPID PANEL
Cholesterol: 148 mg/dL (ref 0–200)
HDL: 57.6 mg/dL (ref >39.00)
NonHDL: 89.92
Total CHOL/HDL Ratio: 3
Triglycerides: 244 mg/dL — ABNORMAL HIGH (ref 0.0–149.0)
VLDL: 48.8 mg/dL — ABNORMAL HIGH (ref 0.0–40.0)

## 2021-06-13 LAB — CBC WITH DIFFERENTIAL/PLATELET
Basophils Absolute: 0 10*3/uL (ref 0.0–0.1)
Basophils Relative: 0.6 % (ref 0.0–3.0)
Eosinophils Absolute: 0.2 10*3/uL (ref 0.0–0.7)
Eosinophils Relative: 3.4 % (ref 0.0–5.0)
HCT: 39.1 % (ref 36.0–46.0)
Hemoglobin: 13.2 g/dL (ref 12.0–15.0)
Lymphocytes Relative: 23 % (ref 12.0–46.0)
Lymphs Abs: 1.2 10*3/uL (ref 0.7–4.0)
MCHC: 33.7 g/dL (ref 30.0–36.0)
MCV: 87 fl (ref 78.0–100.0)
Monocytes Absolute: 0.4 10*3/uL (ref 0.1–1.0)
Monocytes Relative: 8.1 % (ref 3.0–12.0)
Neutro Abs: 3.4 10*3/uL (ref 1.4–7.7)
Neutrophils Relative %: 64.9 % (ref 43.0–77.0)
Platelets: 192 10*3/uL (ref 150.0–400.0)
RBC: 4.49 Mil/uL (ref 3.87–5.11)
RDW: 13.9 % (ref 11.5–15.5)
WBC: 5.2 10*3/uL (ref 4.0–10.5)

## 2021-06-13 LAB — HEPATIC FUNCTION PANEL
ALT: 12 U/L (ref 0–35)
AST: 19 U/L (ref 0–37)
Albumin: 4.3 g/dL (ref 3.5–5.2)
Alkaline Phosphatase: 82 U/L (ref 39–117)
Bilirubin, Direct: 0.1 mg/dL (ref 0.0–0.3)
Total Bilirubin: 0.5 mg/dL (ref 0.2–1.2)
Total Protein: 7 g/dL (ref 6.0–8.3)

## 2021-06-13 LAB — LDL CHOLESTEROL, DIRECT: Direct LDL: 54 mg/dL

## 2021-06-13 LAB — BASIC METABOLIC PANEL
BUN: 24 mg/dL — ABNORMAL HIGH (ref 6–23)
CO2: 25 mEq/L (ref 19–32)
Calcium: 10.5 mg/dL (ref 8.4–10.5)
Chloride: 103 mEq/L (ref 96–112)
Creatinine, Ser: 1.6 mg/dL — ABNORMAL HIGH (ref 0.40–1.20)
GFR: 29.9 mL/min — ABNORMAL LOW (ref >60.00)
Glucose, Bld: 91 mg/dL (ref 70–99)
Potassium: 4.6 mEq/L (ref 3.5–5.1)
Sodium: 139 mEq/L (ref 135–145)

## 2021-06-13 LAB — T3, FREE: T3, Free: 3.4 pg/mL (ref 2.3–4.2)

## 2021-06-13 LAB — HEMOGLOBIN A1C: Hgb A1c MFr Bld: 6.1 % (ref 4.6–6.5)

## 2021-06-13 LAB — TSH: TSH: 2.09 u[IU]/mL (ref 0.35–5.50)

## 2021-06-13 LAB — T4, FREE: Free T4: 0.86 ng/dL (ref 0.60–1.60)

## 2021-06-13 MED ORDER — KETOCONAZOLE 2 % EX CREA: 1.0000 "application " | TOPICAL_CREAM | Freq: Two times a day (BID) | CUTANEOUS | 5 refills | Status: DC | PRN

## 2021-06-13 NOTE — Progress Notes (Signed)
Subjective:    Patient ID: Barbara [name], female    DOB: January 18, 1941, 80 y.o.   MRN: [id number]  HPI Here to follow up on issues. She feels fairly well physically, but she has been under a lot of stress with her husband's failing health. He is now in a long term nursing facility, and she spends most of her time there with him. She will see Dr. Claiborne Billings on 07-12-21. Her BP has been stable, and she has not had any atrial fibrillation for quite a while. She mentions some irritated skin under both breasts. Her appetite and sleep are intact.    Review of Systems  Constitutional: Negative.   HENT: Negative.    Eyes: Negative.   Respiratory: Negative.    Cardiovascular: Negative.   Gastrointestinal: Negative.   Genitourinary:  Negative for decreased urine volume, difficulty urinating, dyspareunia, dysuria, enuresis, flank pain, frequency, hematuria, pelvic pain and urgency.  Musculoskeletal:  Positive for arthralgias and back pain.  Skin:  Positive for rash.  Neurological: Negative.  Negative for headaches.  Psychiatric/Behavioral: Negative.        Objective:   Physical Exam Constitutional:      General: She is not in acute distress.    Appearance: She is well-developed.     Comments: She walks with a cane   HENT:     Head: Normocephalic and atraumatic.     Right Ear: External ear normal.     Left Ear: External ear normal.     Nose: Nose normal.     Mouth/Throat:     Pharynx: No oropharyngeal exudate.  Eyes:     General: No scleral icterus.    Conjunctiva/sclera: Conjunctivae normal.     Pupils: Pupils are equal, round, and reactive to light.  Neck:     Thyroid: No thyromegaly.     Vascular: No JVD.  Cardiovascular:     Rate and Rhythm: Normal rate and regular rhythm.     Heart sounds: Normal heart sounds. No murmur heard.   No friction rub. No gallop.  Pulmonary:     Effort: Pulmonary effort is normal. No respiratory distress.     Breath sounds: Normal breath sounds. No  wheezing or rales.  Chest:     Chest wall: No tenderness.  Abdominal:     General: Bowel sounds are normal. There is no distension.     Palpations: Abdomen is soft. There is no mass.     Tenderness: There is no abdominal tenderness. There is no guarding or rebound.  Musculoskeletal:        General: No tenderness. Normal range of motion.     Cervical back: Normal range of motion and neck supple.  Lymphadenopathy:     Cervical: No cervical adenopathy.  Skin:    General: Skin is warm and dry.     Comments: There is a red macular rash under both breasts  Neurological:     Mental Status: She is alert and oriented to person, place, and time.     Cranial Nerves: No cranial nerve deficit.     Motor: No abnormal muscle tone.     Coordination: Coordination normal.     Deep Tendon Reflexes: Reflexes are normal and symmetric. Reflexes normal.  Psychiatric:        Behavior: Behavior normal.        Thought Content: Thought content normal.        Judgment: Judgment normal.          Assessment &  Plan:  Her atrial fibrillation and CAD and CHF and HTN are all stable. Her vertigo has been well controlled. She has Candidiasis beneath the breasts, and she can apply Ketoconazole cream BID as needed. We will get fasting labs to check her lipids, renal function, etc. We spent 35 minutes reviewing records and discussing these issues.  Alysia Penna, MD

## 2021-06-14 NOTE — Telephone Encounter (Signed)
Spoke to patient she stated she needs a prior authorization for Tikosyn.Stated she took last one yesterday.I called 3 local [facility] and local [facility] [place] no one has Tikosyn.[facility] will call patient tonight. Message sent to our pharmacist for a prior auth.

## 2021-06-14 NOTE — Telephone Encounter (Signed)
Looks like a PA had already been submitted for this patient and denied.  Additional Information Required We are unable to process your request for prior authorization for DOFETILIDE CAP 125MCG for the above member due to [facility] has a denied request on file for DOFETILIDE CAP 125MCG for this member. Please refer to the appeals process outlined in the original denial or contact Prior Authorization Department at [phone] for further questions.

## 2021-06-14 NOTE — Telephone Encounter (Signed)
Pt c/o medication issue:  1. Name of Medication:  dofetilide (TIKOSYN) 125 MCG capsule  2. How are you currently taking this medication (dosage and times per day)?    3. Are you having a reaction (difficulty breathing--STAT)?   4. What is your medication issue?   Patient is following up. She states [facility] not [facility] because they are needing a PA from our office first. Is anyone able to assist with this?

## 2021-06-14 NOTE — Telephone Encounter (Signed)
Patient called to say that she is out of medication and wanted to know if the office can provider her with samples. Please advise

## 2021-06-15 NOTE — Telephone Encounter (Signed)
Called pt to confirm if she was able to receive refill for tikyson. Pt state she just received a new refill and report pharmacy indicated they were able to receive authorization from insurance.

## 2021-06-15 NOTE — Telephone Encounter (Signed)
**Note De-Identified  Obfuscation** I called [employer] and s/w Baxter Flattery who transferred my call to [facility] where [place] advised me that they do not have a pt by this name, DOB, or address in their system and that based on the BIN, PCN,and GRP: on her card [facility] is who I neededto call.  I called [facility] and s/w Sidra who placed me on hold for more than 10 mins checking the pts plan. When she returned to the call she advised me that the pt has a differ part D plan as they only cover her in hospital medications.  I called the pt who gave me the numbers I need to do this PA as follows: ID: [id number] BIN: [id number] GRP: [id number]  The pt states that the pharmacy is giving her enough Dofetilide to last her this weekend.  I will do a urgent PA now.

## 2021-06-15 NOTE — Telephone Encounter (Signed)
**Note De-Identified  Obfuscation** After being transferred and calls being dropped to several Dept's within [facility] I was finally able to do this urgent Dofetilide PA over the phone with Beverlee Nims at [employer] (handles [facility] [place] claims) at [phone].  Per Beverlee Nims the pts name in their system is [name] and that maybe why I have had such a hard time doing this PA.  Beverlee Nims states that they will have a determination within 24 hours.

## 2021-06-21 ENCOUNTER — Other Ambulatory Visit: Payer: Self-pay | Admitting: Medical

## 2021-06-21 ENCOUNTER — Telehealth: Payer: Self-pay

## 2021-06-21 MED ORDER — DOFETILIDE 125 MCG PO CAPS: 125.0000 ug | ORAL_CAPSULE | Freq: Two times a day (BID) | ORAL | 2 refills | Status: DC

## 2021-06-21 NOTE — Telephone Encounter (Signed)
Received notification that [facility] approved coverage for Dofetilide. From 06/14/2021-06/13/2022. Will scan into chart.

## 2021-07-05 ENCOUNTER — Ambulatory Visit: Payer: Medicare Other | Admitting: Cardiovascular Disease

## 2021-07-12 ENCOUNTER — Ambulatory Visit: Payer: Medicare Other | Admitting: Cardiovascular Disease

## 2021-08-01 ENCOUNTER — Other Ambulatory Visit: Payer: Self-pay

## 2021-08-01 ENCOUNTER — Ambulatory Visit (INDEPENDENT_AMBULATORY_CARE_PROVIDER_SITE_OTHER): Payer: Medicare Other | Admitting: Family Medicine

## 2021-08-01 ENCOUNTER — Encounter: Payer: Self-pay | Admitting: Family Medicine

## 2021-08-01 VITALS — BP 130/68 | HR 75 | Temp 98.6°F | Wt 167.4 lb

## 2021-08-01 DIAGNOSIS — R319 Hematuria, unspecified: Secondary | ICD-10-CM | POA: Diagnosis not present

## 2021-08-01 DIAGNOSIS — N3001 Acute cystitis with hematuria: Secondary | ICD-10-CM | POA: Diagnosis not present

## 2021-08-01 LAB — POC URINALSYSI DIPSTICK (AUTOMATED)
Bilirubin, UA: NEGATIVE
Glucose, UA: NEGATIVE
Ketones, UA: NEGATIVE
Leukocytes, UA: NEGATIVE
Nitrite, UA: NEGATIVE
Protein, UA: POSITIVE — AB
Spec Grav, UA: 1.015 (ref 1.010–1.025)
Urobilinogen, UA: 0.2 E.U./dL
pH, UA: 5 (ref 5.0–8.0)

## 2021-08-01 MED ORDER — DOCUSATE SODIUM 100 MG PO CAPS
100.0000 mg | ORAL_CAPSULE | Freq: Two times a day (BID) | ORAL | 5 refills | Status: DC
Start: 2021-08-01 — End: 2022-08-14

## 2021-08-01 MED ORDER — CIPROFLOXACIN HCL 500 MG PO TABS: 500.0000 mg | ORAL_TABLET | Freq: Two times a day (BID) | ORAL | 0 refills | Status: DC

## 2021-08-01 NOTE — Progress Notes (Signed)
   Subjective:    Patient ID: Barbara [name], female    DOB: [birth year], 80 y.o.   MRN: [id number]  HPI Here for 3 days of LLQ pain, urgency and frequency of urination ,and blood in the urine. No nausea or fever. She is drinking lots of water and cranberry juice. She had similar symptoms in July and she went to urgent care. They treated her with Keflex. The culture returned as negative however.    Review of Systems  Constitutional: Negative.   Respiratory: Negative.    Cardiovascular: Negative.   Gastrointestinal: Negative.   Genitourinary:  Positive for flank pain, frequency, hematuria and urgency. Negative for dysuria.      Objective:   Physical Exam Constitutional:      General: She is not in acute distress.    Appearance: Normal appearance.  Cardiovascular:     Rate and Rhythm: Normal rate and regular rhythm.     Pulses: Normal pulses.     Heart sounds: Normal heart sounds.  Pulmonary:     Effort: Pulmonary effort is normal.     Breath sounds: Normal breath sounds.  Abdominal:     General: Abdomen is flat. Bowel sounds are normal. There is no distension.     Palpations: Abdomen is soft. There is no mass.     Tenderness: There is left CVA tenderness. There is no right CVA tenderness, guarding or rebound.     Hernia: No hernia is present.     Comments: Tender in the let flank  Neurological:     Mental Status: She is alert.          Assessment & Plan:  UTI, treat with Cipro. Culture the sample.  Alysia Penna, MD

## 2021-08-02 LAB — URINE CULTURE
MICRO NUMBER:: 12434459
Result:: NO GROWTH
SPECIMEN QUALITY:: ADEQUATE

## 2021-08-03 ENCOUNTER — Other Ambulatory Visit: Payer: Self-pay

## 2021-08-03 DIAGNOSIS — N3001 Acute cystitis with hematuria: Secondary | ICD-10-CM

## 2021-08-08 ENCOUNTER — Ambulatory Visit (INDEPENDENT_AMBULATORY_CARE_PROVIDER_SITE_OTHER): Payer: Medicare Other | Admitting: Cardiovascular Disease

## 2021-08-08 ENCOUNTER — Encounter: Payer: Self-pay | Admitting: Cardiovascular Disease

## 2021-08-08 ENCOUNTER — Other Ambulatory Visit: Payer: Self-pay

## 2021-08-08 DIAGNOSIS — N1832 Chronic kidney disease, stage 3b: Secondary | ICD-10-CM

## 2021-08-08 DIAGNOSIS — Z7901 Long term (current) use of anticoagulants: Secondary | ICD-10-CM

## 2021-08-08 DIAGNOSIS — Z9989 Dependence on other enabling machines and devices: Secondary | ICD-10-CM

## 2021-08-08 DIAGNOSIS — E785 Hyperlipidemia, unspecified: Secondary | ICD-10-CM

## 2021-08-08 DIAGNOSIS — I422 Other hypertrophic cardiomyopathy: Secondary | ICD-10-CM

## 2021-08-08 DIAGNOSIS — I2510 Atherosclerotic heart disease of native coronary artery without angina pectoris: Secondary | ICD-10-CM | POA: Diagnosis not present

## 2021-08-08 DIAGNOSIS — I480 Paroxysmal atrial fibrillation: Secondary | ICD-10-CM

## 2021-08-08 DIAGNOSIS — G4733 Obstructive sleep apnea (adult) (pediatric): Secondary | ICD-10-CM | POA: Diagnosis not present

## 2021-08-08 MED ORDER — SPIRONOLACTONE 25 MG PO TABS: 12.5000 mg | ORAL_TABLET | Freq: Every day | ORAL | 3 refills | Status: DC

## 2021-08-08 MED ORDER — AMLODIPINE BESYLATE 5 MG PO TABS: 5.0000 mg | ORAL_TABLET | Freq: Every day | ORAL | 3 refills | Status: DC

## 2021-08-08 NOTE — Progress Notes (Signed)
Cardiology Office Note    Date:  08/10/2021   ID:  Barbara [name], DOB [birth year], MRN [id number]  PCP:  Laurey Morale, MD  Cardiologist:  Shelva Majestic, MD   7 month F/U follow-up evaluation  History of Present Illness:  Barbara [name] is a 80 y.o. female who has CAD and in November 2000 underwent stenting of a high-grade LAD stenosis with an [id number] 3.518 mm bare-metal stent. A nuclear perfusion study in November 2013 was unchanged from previously and continued to show normal perfusion without scar or ischemia. Ejection fraction was 68%. An echo Doppler study revealed an ejection fraction in the 55-60% range with grade 1 diastolic dysfunction. 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   Additional problems include hypertension as well as hyperlipidemia. Over the past several months she has noticed that she is more tired.  She also has noticed more shortness of breath with activity.  She has noted some vague indigestion symptoms.  She admits to some mild ankle swelling.  She has been taking Crestor 10 mg and denies myalgias.  She has been on losartan HCT 100/25 as well as amlodipine 10 mg and Lasix 20 mg for blood pressure and peripheral edema.   When I saw her in November 2015 her blood pressure was controlled.  She was bradycardic not on any rate control medication and I raise the possibility of a component of chronotropic incompetence.  To further evaluate her exertional dyspnea she underwent a nuclear perfusion study on 10/06/2014 which was normal.  Post stress ejection fraction was 67%.  An echo Doppler study done on 10/06/2014 showed an EF of 60-65% with moderate left ventricular hypertrophy.  There was grade 1 diastolic dysfunction.  She had indeterminate LV filling pressure.  There was mild aortic sclerosis without stenosis, mitral annular calcification with mild MR, and mild  dilatation of her left atrium with mild tricuspid regurgitation.  Pulmonary pressures were minimally elevated at 31 mm.   She underwent successful left knee surgery by Dr. Lindwood Qua in March 2016.  She tolerated surgery well from a cardiovascular standpoint.   An echo Doppler study in March 2017 showed mild LVH with vigorous LV function with an EF of 65-70%.  There was grade 2 diastolic dysfunction.  There was moderate aortic sclerosis without stenosis.  There was mild LA dilation.  PA pressures were upper normal.   When I saw her in May 2018, she had noticed issues with ankle swelling.  She had stopped taking furosemide since his had expired.  I elected to institute spironolactone with her moderate diastolic dysfunction.  She was taking amlodipine 10 mg, losartan HCT 100/25 mg and she has been on spironolactone 25 mg daily.     At her evaluation in August 2018 she was in sinus rhythm and had bilateral ankle and feet swelling.  I reduced her amlodipine to 5 mg and further titrated spironolactone to 25 mg twice a day.  Recently she has been feeling well.  She went to [place] over Christmas and did well.  However, since 11/05/2017 she noticed her heart rate being a little faster and subsequently felt some irregularity.  I   saw her in the office on 11/10/2017 and she was in atrial flutter with variable block. Her blood pressure was elevated.  I started metoprolol 25 mg twice a day and started eliquis 5 mg twice a day.  An echo  Doppler study on November 17, 2017 showed an EF of 70-75% with grade 2 diastolic dysfunction.  There was mild to moderate MR, moderately severe LA enlargement, and PA pressure was increased at 53 mm.  She was hospitalized and underwent Tikosyn loading with Dr. Rayann Heman, which ultimately pharmacologically cardioverted her back to sinus rhythm.  She was seen in follow-up in the atrial fibrillation clinic on 12/01/2017.  At that time, she was maintaining sinus rhythm and her QT interval  was stable.     I saw her on December 03, 2017 at which time she was doing well.  Subsequently she has seen Dr. Rayann Heman.  Has had issues with hoarseness and was referred to Dr. Redmond Baseman for ENT evaluation.  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  She is scheduled to undergo a CT of the soft tissue of her neck with contrast on January 20, 2018.  She was recently notified that her valsartan has a contaminant.  She denies any awareness of recurrent atrial fibrillation.  She denies significant swelling.  She has been maintained on furosemide 40 mg daily, losartan 50 mg for hypertension.  She denies bleeding on Eliquis 5 mg twice daily.  She continues to be on rosuvastatin for hyperlipidemia.  She is on Tikosyn 125 mcg twice a day.     I saw her in the office in March 2019, she has continued to do well.  Due to concerns for obstructive sleep apnea she was referred for a sleep study.  This was done in December 2019 revealed severe sleep apnea with an AHI at 37/h, with REM sleep AHI at 52/h in supine sleep AHI at 47/h.  She had significant oxygen desaturation to a nadir of 77%.  CPAP was instituted and she was titrated up to 11 cm.  She was started on CPAP therapy in 2020 with [employer] [facility] [employer] as her DME company.  A most recent download from April 7 through Mar 10, 2019 shows that she is meeting compliance standards with 90% of usage days.  She is averaging 6 hours and 42 minutes of usage per night.  At 10 cm water pressure AHI is excellent at 2.9.  There is no leak.  She now has nasal pillows, but previously it had a full facemask which she did not tolerate.   She developed episodes of chest pain and increasing shortness of breath leading to her hospitalization in April 2020.  Was mild troponin elevation and she had inferolateral T wave abnormalities.  She underwent cardiac catheterization.  The results are as shown below and she was found to have nonobstructive CAD with a widely patent previously placed  LAD stent and mild concomitant CAD.  She had LVH with a "Spade" like ventricle.   She was last evaluated by me on Mar 11, 2019 in a telemedicine visit.  At that time she denied any chest pain but admitted to some shortness of breath.  She was sleeping better.  However she was still not sleeping long and often only averaging between 6 to 7 hours of sleep per night which resulted in some fatigue during the day.  She was unaware of breakthrough snoring.  A download was obtained which showed an AHI of 2.9 at 10 cm set pressure.  She was evaluated by Charyl Dancer in December 2020 in our office.  She continued to do well without chest pain.  Her blood pressure was elevated.  She was maintaining sinus rhythm on Tikosyn 125 mg  twice a day as well as Eliquis 5 mg twice a day.  ECG showed sinus bradycardia at 54 bpm.  I saw her Mar 31, 2020.  Prior to that evaluation she had undergone  shoulder surgery on Mar 23, 2020 the right reverse shoulder arthroplasty by Dr. Stann Mainland for advanced rotator cuff arthropathy and multiple tears.  His surgery well from a cardiac standpoint.  Since surgery she has been in the sling and during this time has not been able to use CPAP.  She will be having a follow-up orthopedic visit over the next several days and hopefully the sling will be taken off and CPAP can be reinstituted.  She denied any recent chest tightness or pressure.  She was unaware of palpitations.  I saw her in September 2021.  Over the last several months, she felt well.  She has gained mobility back to her right shoulder.  She again has resumed CPAP therapy.  A download was obtained from August 2 through July 04, 2020.  She had undergone an echo Doppler study which showed hyperdynamic LV function with ejection fraction at 70 to 75%.  There was mild aortic sclerosis without stenosis.  Estimated RV systolic pressure was mildly increased at 35.9.  She had apical hypertrophy with obliteration of LV cavity in systole and  apex suggestive for apical hypertrophic cardiomyopathy.  She was without chest pain or shortness of breath.   She apparently was hospitalized in December 2021 with atrial fibrillation with RVR.  She was restarted on Eliquis.  She was felt to have demand ischemia.  Tikosyn was restarted.  She was seen by EP who recommended she wear a Zio patch which revealed frequent PACs, nonsustained atrial tachycardia and a disorganized atrial activity without recurrent atrial fibrillation.  She was seen by Jory Sims, NP in January 2022.  She was caring for her husband and was not using her CPAP.  I last saw her in March 2022 at which time she remained stable.  She denied any chest pain or awareness of recurrent atrial fibrillation.  She was inconsistent with CPAP use.  Her husband was ill and was hospitalized and often times she was caring for her husband between 1 AM and 5 AM prior to his hospitalization the day prior to her evaluation with me.  At 10 cm water pressure AHI was 2.1 with average use at 6 hours and 22 minutes.  She continues to be on Eliquis for anticoagulation without bleeding.  Her blood pressure remained stable.  She continued to be on rosuvastatin 40 mg with LDL at 50 in December 2021. She denies any chest pain or awareness of recurrent A. fib.  Her husband was apparently hospitalized yesterday.  She has not been consistently using CPAP for long duration since oftentimes she was caring for her husband from 1 AM to 5 AM prior to his hospitalization.  However we did obtain a download in the office from February 14 through January 16, 2021.  Usage was 70% with average usage 6 hours and 22 minutes.  At a 10 cm set pressure, AHI was 2.1.  She has continued to be on amlodipine 5 mg, candesartan 16 mg, and spironolactone 25 mg for hypertension.  She is tolerating Tikosyn 125 mg twice a day.  She was on rosuvastatin for hyperlipidemia.   Since I last saw her, she has felt well.  She recently was found to  have a urinary tract infection has been on antibiotics by Dr. Alysia Penna.  She  continues to use CPAP therapy but over the past month she had reduced compliance.  With average use just under 6 hours per night.  AHI is 1.4.  She had laboratory by her primary physician in August 2022.  Creatinine was 1.60.  She denies any chest pain, awareness of recurrent atrial fibrillation, or leg swelling.  She presents for follow-up evaluation.  Past Medical History:  Diagnosis Date   A-fib [facility])    Allergy    CAD (coronary artery disease)    sees Dr. Shelva Majestic  cardiac stents - 2000   CHF (congestive heart failure) ([place])    Colon polyps    Complication of anesthesia    rash/hives with "caines"   Dyspnea    02/12/18 " when my heart gets out of rhythm, it has not been out of rhythym- since I have been on Tikosyn (11/2017)   Dysrhythmia    afib fib   GERD (gastroesophageal reflux disease)    takes OTC- Omeprazole- prn   Heart murmur    History of stress test    show normal perfusion without scar or ischemia, post EF 68%   Hx of echocardiogram    show an EF 55%-60% range with grade 1 diastolic dysfunction, she had mitral anular calcification with mild MR, moderate LA dilation and mild pulmonary hypertension with a PA estimated pressure of 24m   Hyperlipidemia    Hypertension    [place]ST [place]    Osteoarthritis    Pneumonia    hx of 2015    PONV (postoperative nausea and vomiting)     Past Surgical History:  Procedure Laterality Date   CARDIAC CATHETERIZATION     11/2017   cardiac stents  2000   COLONOSCOPY  01-05-14   per Dr. JTeena Irani clear, no repeats needed    COLONOSCOPY     CORONARY STENT PLACEMENT  2000   in LAD   DIRECT LARYNGOSCOPY WITH RADIAESSE INJECTION N/A 02/13/2018   Procedure: DIRECT LARYNGOSCOPY WITH RADIAESSE INJECTION;  Surgeon: BMelida Quitter MD;  [facility] [facility]  Service: ENT;  Laterality: N/A;   EYE SURGERY Left    CATARACT  REMOVAL   KNEE ARTHROSCOPY Left 01/06/2015   Procedure: LEFT KNEE ARTHROSCOPY, abrasion chondroplasty of the medial femerol condryl,medial and lateral menisectomy, microfracture , synovectomy of the suprpatellar pouch;  Surgeon: RLatanya Maudlin MD;  Location: [facility];  Service: Orthopedics;  Laterality: Left;   LEFT HEART CATH AND CORONARY ANGIOGRAPHY N/A 02/26/2019   Procedure: LEFT HEART CATH AND CORONARY ANGIOGRAPHY;  Surgeon: KTroy Sine MD;  [facility];  Service: Cardiovascular;  Laterality: N/A;   MICROLARYNGOSCOPY W/VOCAL CORD INJECTION N/A 08/07/2018   Procedure: MICROLARYNGOSCOPY WITH VOCAL CORD INJECTION OF PROLARYN;  Surgeon: BMelida Quitter MD;  [place] [facility]  Service: ENT;  Laterality: N/A;  JET VENTILATION   REVERSE SHOULDER ARTHROPLASTY Right 03/23/2020   Procedure: REVERSE SHOULDER ARTHROPLASTY;  Surgeon: RNicholes Stairs MD;  [facility]  Service: Orthopedics;  Laterality: Right;   VAGINAL HYSTERECTOMY  1971    Current Medications: Outpatient Medications Prior to Visit  Medication Sig Dispense Refill   acetaminophen (TYLENOL) 500 MG tablet Take 1,000 mg by mouth at bedtime. As needed     amoxicillin (AMOXIL) 500 MG tablet Take 2,000 mg by mouth See admin instructions. Take 4 tablets (2000 mg) by mouth one hour prior to dental appointments     apixaban (ELIQUIS) 5 MG TABS tablet Take 1 tablet (  5 mg total) by mouth 2 (two) times daily. 180 tablet 1   BINAXNOW COVID-19 AG HOME TEST KIT Use as Directed on the Package     candesartan (ATACAND) 16 MG tablet TAKE 1 TABLET DAILY 90 tablet 3   Cholecalciferol (VITAMIN D3) 2000 units TABS Take 2,000 Units by mouth daily.     ciprofloxacin (CIPRO) 500 MG tablet Take 1 tablet (500 mg total) by mouth 2 (two) times daily. 14 tablet 0   COVID-19 At Home Test [facility] COVID-19 AG HOME TEST VI) BinaxNOW COVID-19 Ag Self Test kit  Use as Directed on the Package     dextromethorphan (DELSYM) 30 MG/5ML liquid Take  15 mg by mouth daily as needed for cough.     diclofenac Sodium (VOLTAREN) 1 % GEL Apply 1 application topically 4 (four) times daily as needed (knee pain).     docusate sodium (COLACE) 100 MG capsule Take 1 capsule (100 mg total) by mouth 2 (two) times daily. 60 capsule 5   dofetilide (TIKOSYN) 125 MCG capsule Take 1 capsule (125 mcg total) by mouth 2 (two) times daily. 180 capsule 2   HYDROcodone bit-homatropine (HYCODAN) 5-1.5 MG/5ML syrup      hypromellose (VISTA GEL DRY EYE RELIEF) 0.3 % GEL ophthalmic ointment Place 1 application into both eyes at bedtime as needed for dry eyes.      ipratropium (ATROVENT) 0.06 % nasal spray Place 2 sprays into both nostrils daily as needed for rhinitis.   5   ketoconazole (NIZORAL) 2 % cream Apply 1 application topically 2 (two) times daily as needed for irritation. 60 g 5   ketotifen (ZADITOR) 0.025 % ophthalmic solution Place 1 drop into both eyes 2 (two) times daily as needed (eye irritation/allergy).      loratadine (CLARITIN) 10 MG tablet Take 10 mg by mouth daily as needed (allergies/sinus issues).      Multiple Vitamin (MULTIVITAMIN WITH MINERALS) TABS tablet Take 1 tablet by mouth daily.     nitroGLYCERIN (NITROSTAT) 0.4 MG SL tablet DISSOLVE ONE TABLET UNDER THE TONGUE EVERY 5 MINUTES AS NEEDED FOR CHEST PAIN.  DO NOT EXCEED A TOTAL OF 3 DOSES IN 15 MINUTES 25 tablet 3   pantoprazole (PROTONIX) 40 MG tablet TAKE 1 TABLET DAILY 90 tablet 2   PRESCRIPTION MEDICATION See admin instructions. CPAP- At bedtime     rosuvastatin (CRESTOR) 40 MG tablet TAKE 1 TABLET DAILY 90 tablet 3   triamcinolone (NASACORT) 55 MCG/ACT nasal inhaler Place 1 spray into both nostrils daily as needed (for allergies).      amLODipine (NORVASC) 5 MG tablet Take 1 tablet (5 mg total) by mouth daily. 90 tablet 1   spironolactone (ALDACTONE) 25 MG tablet TAKE 1 TABLET DAILY 90 tablet 3   No facility-administered medications prior to visit.     Allergies:   Lidocaine, Morphine,  Procaine hcl, Sulfonamide derivatives, Norco [hydrocodone-acetaminophen], Tramadol, Vytorin [ezetimibe-simvastatin], and Tape   Social History   Socioeconomic History   Marital status: Married    Spouse name: Not on file   Number of children: 2   Years of education: Not on file   Highest education level: Not on file  Occupational History   Not on file  Tobacco Use   Smoking status: Former    Years: 40.00    Types: Cigarettes    Quit date: 11/04/1996    Years since quitting: 24.7   Smokeless tobacco: Never  Vaping Use   Vaping Use: Never used  Substance and  Sexual Activity   Alcohol use: No    Alcohol/week: 0.0 standard drinks   Drug use: No   Sexual activity: Not on file  Other Topics Concern   Not on file  Social History Narrative   Not on file   Social Determinants of Health   Financial Resource Strain: Low Risk    Difficulty of Paying Living Expenses: Not hard at all  Food Insecurity: No Food Insecurity   Worried About Charity fundraiser in the Last Year: Never true   [place] in the Last Year: Never true  Transportation Needs: No Transportation Needs   Lack of Transportation (Medical): No   Lack of Transportation (Non-Medical): No  Physical Activity: Inactive   Days of Exercise per Week: 0 days   Minutes of Exercise per Session: 0 min  Stress: No Stress Concern Present   Feeling of Stress : Only a little  Social Connections: Moderately Integrated   Frequency of Communication with Friends and Family: More than three times a week   Frequency of Social Gatherings with Friends and Family: More than three times a week   Attends Religious Services: More than 4 times per year   Active Member of [employer] or Organizations: No   Attends Archivist Meetings: Never   Marital Status: Married     Family History:  The patient'sfamily history includes Cancer in her maternal grandmother; Heart disease in her maternal grandfather.   ROS General: Negative; No  fevers, chills, or night sweats;  HEENT: Negative; No changes in vision or hearing, sinus congestion, difficulty swallowing Pulmonary: Negative; No cough, wheezing, shortness of breath, hemoptysis Cardiovascular: Negative; No chest pain, presyncope, syncope, palpitations GI: Negative; No nausea, vomiting, diarrhea, or abdominal pain GU: Negative; No dysuria, hematuria, or difficulty voiding Musculoskeletal: Right shoulder surgery May 2021 Hematologic/Oncology: Negative; no easy bruising, bleeding Endocrine: Negative; no heat/cold intolerance; no diabetes Neuro: Negative; no changes in balance, headaches Skin: Negative; No rashes or skin lesions Psychiatric: Negative; No behavioral problems, depression Sleep: Negative; No snoring, daytime sleepiness, hypersomnolence, bruxism, restless legs, hypnogognic hallucinations, no cataplexy Other comprehensive 14 point system review is negative.   PHYSICAL EXAM:   VS:  BP 136/75   Pulse (!) 56   Ht '5\' 8"'  (1.727 m)   Wt 171 lb 3.2 oz (77.7 kg)   SpO2 99%   BMI 26.03 kg/m    Repeat blood pressure by me was 130/72  Wt Readings from Last 3 Encounters:  08/08/21 171 lb 3.2 oz (77.7 kg)  08/01/21 167 lb 6 oz (75.9 kg)  06/13/21 159 lb (72.1 kg)     General: Alert, oriented, no distress.  Skin: normal turgor, no rashes, warm and dry HEENT: Normocephalic, atraumatic. Pupils equal round and reactive to light; sclera anicteric; extraocular muscles intact;  Nose without nasal septal hypertrophy Mouth/Parynx benign; Mallinpatti scale 3 Neck: No JVD, no carotid bruits; normal carotid upstroke Lungs: clear to ausculatation and percussion; no wheezing or rales Chest wall: without tenderness to palpitation Heart: PMI not displaced, RRR, s1 s2 normal, 2/6 systolic murmur, no diastolic murmur, no rubs, gallops, thrills, or heaves Abdomen: soft, nontender; no hepatosplenomehaly, BS+; abdominal aorta nontender and not dilated by palpation. Back: no CVA  tenderness Pulses 2+ Musculoskeletal: full range of motion, normal strength, no joint deformities Extremities: no clubbing cyanosis or edema, Homan's sign negative  Neurologic: grossly nonfocal; Cranial nerves grossly wnl Psychologic: Normal mood and affect    Studies/Labs Reviewed:   ECG (independently read  by me): Sinus bradycardia at 56; LVH with repolarization, QTc 428 msec  January 17, 2021 ECG (independently read by me): Sinus bradycardia at 56, LVH with prior T wave abnormalities; QTc 441 msec  September 2021 ECG (independently read by me): Sinus bradycardia at 54; LVHwih T wave abnormality inferolaterally  I personally reviewed the ECG from October 26, 2019 which showed sinus bradycardia at 54 bpm, LVH with repolarization abnormality.  QTc interval was 398 ms.  Recent Labs: BMP Latest Ref Rng & Units 06/13/2021 02/07/2021 01/19/2021  Glucose 70 - 99 mg/dL 91 - 138(H)  BUN 6 - 23 mg/dL 24(H) - 27  Creatinine 0.40 - 1.20 mg/dL 1.60(H) - 1.28(H)  BUN/Creat Ratio 12 - 28 - - 21  Sodium 135 - 145 mEq/L 139 - 139  Potassium 3.5 - 5.1 mEq/L 4.6 - 4.5  Chloride 96 - 112 mEq/L 103 - 102  CO2 19 - 32 mEq/L 25 - 20  Calcium 8.4 - 10.5 mg/dL 10.5 10.2 10.7(H)     Hepatic Function Latest Ref Rng & Units 06/13/2021 10/09/2020 10/08/2020  Total Protein 6.0 - 8.3 g/dL 7.0 6.5 6.5  Albumin 3.5 - 5.2 g/dL 4.3 3.5 3.8  AST 0 - 37 U/L '19 22 27  ' ALT 0 - 35 U/L '12 16 15  ' Alk Phosphatase 39 - 117 U/L 82 66 57  Total Bilirubin 0.2 - 1.2 mg/dL 0.5 0.7 0.7  Bilirubin, Direct 0.0 - 0.3 mg/dL 0.1 - -    CBC Latest Ref Rng & Units 06/13/2021 10/12/2020 10/11/2020  WBC 4.0 - 10.5 K/uL 5.2 5.1 5.0  Hemoglobin 12.0 - 15.0 g/dL 13.2 11.7(L) 11.7(L)  Hematocrit 36.0 - 46.0 % 39.1 37.1 36.8  Platelets 150.0 - 400.0 K/uL 192.0 198 183   Lab Results  Component Value Date   MCV 87.0 06/13/2021   MCV 92.8 10/12/2020   MCV 92.9 10/11/2020   Lab Results  Component Value Date   TSH 2.09 06/13/2021    Lab Results  Component Value Date   HGBA1C 6.1 06/13/2021     BNP    Component Value Date/Time   BNP 703.5 (H) 02/25/2019 1122    ProBNP No results found for: PROBNP   Lipid Panel     Component Value Date/Time   CHOL 148 06/13/2021 1459   TRIG 244.0 (H) 06/13/2021 1459   HDL 57.60 06/13/2021 1459   CHOLHDL 3 06/13/2021 1459   VLDL 48.8 (H) 06/13/2021 1459   LDLCALC 50 10/08/2020 0313   LDLDIRECT 54.0 06/13/2021 1459     RADIOLOGY: No results found.   Additional studies/ records that were reviewed today include:    Sleep Study IMPRESSIONS: 10/07/2018 - Severe obstructive sleep apnea occurred during the diagnostic portion of the study (AHI 36.9/h; RDI 45.1/h); events were worse with supine position (AHI 46.8/h)  and during REM sleep (AHI 52.2/h).  CPAP was initiated at 5 cm and was titrated to optimal PAP pressure of 11 cm of water. - No significant central sleep apnea occurred during the diagnostic portion of the study (CAI = 0.0/hour). - Severe oxygen desaturation during the diagnostic portion of the study to a nadir of 77%. - The patient snored with loud snoring volume during the diagnostic portion of the study. - No cardiac abnormalities were noted during this study. - Clinically significant periodic limb movements did not occur during sleep.   DIAGNOSIS - Obstructive Sleep Apnea (327.23 [G47.33 ICD-10])   RECOMMENDATIONS - Trial of CPAP therapy with EPR at 11 cm H2O  with heated humidification. A Medium size Resmed Full Face Mask AirFit 20 for Her mask was used for the titration.  - Effort should be made to optimize nasal and oropharyngeal patency. - Avoid alcohol, sedatives and other CNS depressants that may worsen sleep apnea and disrupt normal sleep architecture. - Sleep hygiene should be reviewed to assess factors that may improve sleep quality. - Weight management and regular exercise should be initiated or continued. - Recommend a download in 30 days and  sleep clinic evaluation after 4 weeks of therapy.   [Electronically signed] 10/18/2018 04:25 PM   Cath: 02/26/19   Prox RCA to Mid RCA lesion is 15% stenosed. Ost LAD to Prox LAD lesion is 20% stenosed. Prox Cx lesion is 20% stenosed.   Mild non-obstructive coronary artery disease with a patent proximal LAD stent with mild 20% intimal hyperplasia (inserted  09/1999); 20% proximal left circumflex narrowing, and mild irregularity of 10 to 15% in the mid RCA.   Hyperdynamic LV function with an "Ace of Spade "configuration and near cavity obliteration in the mid to apical segment during systole.  There is evidence for left ventricular hypertrophy.  LVEDP is 16 mm.   RECOMMENDATION: Medical therapy with optimal blood pressure control, lipid management, and resumption of Eliquis tomorrow    ASSESSMENT:    1. CAD in native artery   2. Apical variant hypertrophic cardiomyopathy ([place])   3. PAF (paroxysmal atrial fibrillation) ([place])   4. OSA on CPAP   5. Hyperlipidemia LDL goal <70   6. Stage 3b chronic kidney disease ([place])   7. Anticoagulated     PLAN:  1.  CAD: Cardiac catheterization from February 26, 2020 demonstrated patent LAD stent with mild nonobstructive CAD involving her circumflex and RCA.  She continues to be stable and denies any recurrent chest pain or episodes of increasing shortness of breath.  2.  Exertional dyspnea: She has a history of exertional dyspnea and has documented LVH.  She has been demonstrated to have a  "spade-like ventricle" with near cavity obliteration in the mid to apical segment of her myocardium during systole.  There is evidence for diastolic dysfunction.  Echo revealed hyperdynamic LV function with EF at 70 to 26%, grade 2 diastolic dysfunction, and apical hypertrophy with obliteration of her LV cavity in systole at apex given the appearance of her "spade-like ventricle "catheterization.  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 Obstructive sleep apnea: Since I last saw her, she has had significantly reduced compliance with ongoing CPAP therapy.  A download was obtained from September 5 through August 07, 2021.  Usage was only 8 out of 30 days but when she used her device average use was almost 6 hours per night and AHI was excellent at 1.4.  I had a long discussion with her today regarding the importance of improved compliance.  I discussed optimal sleep duration and an adult between 7 and 9 hours.  I discussed that she has a significantly increased incidence of recurrent atrial fibrillation if she does not treat her underlying severe sleep apnea and does not use her CPAP device.  She states she will resume significantly increased CPAP use.  4.  PAF: Her last episode of recurrent atrial fibrillation was in December 2021.  She continues to be on Tikosyn and is maintaining sinus rhythm.  I again discussed with her potential increased risk of recurrent atrial fibrillation if her severe sleep apnea is untreated and she is not using her  CPAP device.    5.  CKD: Most recent creatinine has increased to 1.60.  I have recommended she reduce her spironolactone from 25 mg daily down to 12.5 mg.  I will recheck to be met in approximately 3 weeks.  She recently was treated with urinary tract infection.  6.  Eliquis anticoagulation: Well-tolerated without bleeding.  Previous creatinine had been 1.26.  With a age of 61 years, her dose will need to be adjusted to 2.5 mg twice a day if creatinine continues to be greater than 1.5.  6.  Essential hypertension: Her blood pressure today is stable on her regimen consisting of amlodipine 5 mg, candesartan 16 mg and spironolactone 25 mg daily. There is no significant edema.  I have recommended dose reduction of spironolactone in light of her renal insufficiency.  7.  Hyperlipidemia: She continues to be on rosuvastatin 40 mg.  LDL cholesterol in December 2021 was 50.  I will  contact her in follow-up of her laboratory and adjustments will be made accordingly as necessary  Medication Adjustments/Labs and Tests Ordered: Current medicines are reviewed at length with the patient today.  Concerns regarding medicines are outlined above.  Medication changes, Labs and Tests ordered today are listed in the Patient Instructions below. Patient Instructions  Medication Instructions:  DECREASE spironolactone to 12.5 mg (1/2 tablet) daily  *If you need a refill on your cardiac medications before your next appointment, please call your pharmacy*   Lab Work: Make sure Dr. Sarajane Jews checks kidney function at your lab work on 10/13  If you have labs (blood work) drawn today and your tests are completely normal, you will receive your results only by: [place] (if you have MyChart) OR A paper copy in the mail If you have any lab test that is abnormal or we need to change your treatment, we will call you to review the results.   Follow-Up: At [facility], you and your health needs are our priority.  As part of our continuing mission to provide you with exceptional heart care, we have created designated Provider Care Teams.  These Care Teams include your primary Cardiologist (physician) and Advanced Practice Providers (APPs -  Physician Assistants and Nurse Practitioners) who all work together to provide you with the care you need, when you need it.  We recommend signing up for the patient portal called "MyChart".  Sign up information is provided on this After Visit Summary.  MyChart is used to connect with patients for Virtual Visits (Telemedicine).  Patients are able to view lab/test results, encounter notes, upcoming appointments, etc.  Non-urgent messages can be sent to your provider as well.   To learn more about what you can do with MyChart, go to [email or web address].    Your next appointment:   6 month(s)  The format for your next appointment:   In  Person  Provider:   Shelva Majestic, MD      Signed, Shelva Majestic, MD  08/10/2021 11:33 AM    [facility] [address], [place], [place], [place]  [address] Phone: [phone]

## 2021-08-08 NOTE — Patient Instructions (Signed)
Medication Instructions:  DECREASE spironolactone to 12.5 mg (1/2 tablet) daily  *If you need a refill on your cardiac medications before your next appointment, please call your pharmacy*   Lab Work: Make sure Dr. Sarajane Jews checks kidney function at your lab work on 10/13  If you have labs (blood work) drawn today and your tests are completely normal, you will receive your results only by: [place] (if you have MyChart) OR A paper copy in the mail If you have any lab test that is abnormal or we need to change your treatment, we will call you to review the results.   Follow-Up: At [facility], you and your health needs are our priority.  As part of our continuing mission to provide you with exceptional heart care, we have created designated Provider Care Teams.  These Care Teams include your primary Cardiologist (physician) and Advanced Practice Providers (APPs -  Physician Assistants and Nurse Practitioners) who all work together to provide you with the care you need, when you need it.  We recommend signing up for the patient portal called "MyChart".  Sign up information is provided on this After Visit Summary.  MyChart is used to connect with patients for Virtual Visits (Telemedicine).  Patients are able to view lab/test results, encounter notes, upcoming appointments, etc.  Non-urgent messages can be sent to your provider as well.   To learn more about what you can do with MyChart, go to [email or web address].    Your next appointment:   6 month(s)  The format for your next appointment:   In Person  Provider:   Shelva Majestic, MD

## 2021-08-10 ENCOUNTER — Encounter: Payer: Self-pay | Admitting: Cardiovascular Disease

## 2021-08-16 ENCOUNTER — Other Ambulatory Visit: Payer: Medicare Other

## 2021-08-16 ENCOUNTER — Other Ambulatory Visit: Payer: Self-pay

## 2021-08-16 DIAGNOSIS — N3001 Acute cystitis with hematuria: Secondary | ICD-10-CM

## 2021-08-18 LAB — URINE CULTURE

## 2021-08-18 LAB — SPECIMEN STATUS REPORT

## 2021-08-21 ENCOUNTER — Telehealth: Payer: Self-pay | Admitting: Family Medicine

## 2021-08-21 NOTE — Telephone Encounter (Signed)
Pt is calling and would like urine results 

## 2021-08-22 ENCOUNTER — Telehealth: Payer: Self-pay

## 2021-08-22 DIAGNOSIS — N1832 Chronic kidney disease, stage 3b: Secondary | ICD-10-CM

## 2021-08-22 NOTE — Telephone Encounter (Signed)
Pt message sent to Dr Fry for advise 

## 2021-08-23 NOTE — Telephone Encounter (Signed)
I ordered the BMET fo next week

## 2021-08-23 NOTE — Telephone Encounter (Signed)
Reviewed urine culture results with pt verbalized understanding

## 2021-08-23 NOTE — Telephone Encounter (Signed)
Pt has appointment for for this lab on 08/27/2021

## 2021-08-23 NOTE — Addendum Note (Signed)
Addended by: Alysia Penna A on: 08/23/2021 07:39 AM   Modules accepted: Orders

## 2021-08-24 ENCOUNTER — Ambulatory Visit (INDEPENDENT_AMBULATORY_CARE_PROVIDER_SITE_OTHER): Payer: Medicare Other | Admitting: Podiatry

## 2021-08-24 ENCOUNTER — Other Ambulatory Visit: Payer: Self-pay

## 2021-08-24 ENCOUNTER — Encounter: Payer: Self-pay | Admitting: Podiatry

## 2021-08-24 DIAGNOSIS — M79675 Pain in left toe(s): Secondary | ICD-10-CM

## 2021-08-24 DIAGNOSIS — M79674 Pain in right toe(s): Secondary | ICD-10-CM

## 2021-08-24 DIAGNOSIS — B351 Tinea unguium: Secondary | ICD-10-CM | POA: Diagnosis not present

## 2021-08-27 ENCOUNTER — Other Ambulatory Visit: Payer: Self-pay

## 2021-08-27 ENCOUNTER — Other Ambulatory Visit (INDEPENDENT_AMBULATORY_CARE_PROVIDER_SITE_OTHER): Payer: Medicare Other

## 2021-08-27 ENCOUNTER — Ambulatory Visit (INDEPENDENT_AMBULATORY_CARE_PROVIDER_SITE_OTHER): Payer: Medicare Other

## 2021-08-27 DIAGNOSIS — N1832 Chronic kidney disease, stage 3b: Secondary | ICD-10-CM

## 2021-08-27 DIAGNOSIS — Z23 Encounter for immunization: Secondary | ICD-10-CM | POA: Diagnosis not present

## 2021-08-27 LAB — BASIC METABOLIC PANEL
BUN: 33 mg/dL — ABNORMAL HIGH (ref 6–23)
CO2: 25 mEq/L (ref 19–32)
Calcium: 10.3 mg/dL (ref 8.4–10.5)
Chloride: 107 mEq/L (ref 96–112)
Creatinine, Ser: 1.5 mg/dL — ABNORMAL HIGH (ref 0.40–1.20)
GFR: 32.27 mL/min — ABNORMAL LOW (ref >60.00)
Glucose, Bld: 99 mg/dL (ref 70–99)
Potassium: 4.1 mEq/L (ref 3.5–5.1)
Sodium: 140 mEq/L (ref 135–145)

## 2021-08-31 NOTE — Progress Notes (Signed)
  Subjective:  Patient ID: Barbara [name], female    DOB: Mar 26, 1941,  MRN: [id number]  [name] presents to clinic today for thick, elongated toenails b/l feet which are tender when wearing enclosed shoe gear. Pain is relieved with periodic professional debridement.   PCP is Laurey Morale, MD , and last visit was 08/01/2021.  Allergies  Allergen Reactions   Lidocaine Anaphylaxis   Morphine Other (See Comments)    "Body shuts down"   Procaine Hcl Anaphylaxis, Rash and Other (See Comments)    "Anything with 'caine' in it "   Sulfonamide Derivatives Hives   Norco [Hydrocodone-Acetaminophen] Other (See Comments)    "Made me sick"   Tramadol Nausea Only   Vytorin [Ezetimibe-Simvastatin] Other (See Comments)    Unknown   Tape Itching and Other (See Comments)    Patient prefers either paper tape or Coban wrap    Review of Systems: Negative except as noted in the HPI. Objective:   Constitutional Barbara [name] is a pleasant 80 y.o. African American female, WD, WN in NAD. AAO x 3.   Vascular Capillary refill time to digits immediate b/l. Palpable DP pulse(s) b/l lower extremities Palpable PT pulse(s) b/l lower extremities Pedal hair sparse. Lower extremity skin temperature gradient within normal limits. No pain with calf compression RLE. No cyanosis or clubbing noted.  Neurologic Normal speech. Oriented to person, place, and time. Protective sensation intact 5/5 intact bilaterally with 10g monofilament b/l.  Dermatologic Pedal integument with normal turgor, texture and tone BLE. No open wounds b/l lower extremities. No interdigital macerations noted b/l LE. Toenails 1-5 bilaterally elongated, discolored, dystrophic, thickened, and crumbly with subungual debris and tenderness to dorsal palpation.  Orthopedic: Normal muscle strength 5/5 to all lower extremity muscle groups bilaterally. No gross bony deformities b/l lower extremities. Utilizes cane for ambulation  assistance.   Radiographs: None Assessment:   1. Pain due to onychomycosis of toenails of both feet    Plan:  Patient was evaluated and treated and all questions answered. Consent given for treatment as described below: -Examined patient. -Toenails 1-5 left and 1-5 right debrided in length and girth without iatrogenic bleeding with sterile nail nipper and dremel.  -Patient/POA to call should there be question/concern in the interim.  Return in about 3 months (around 11/24/2021).  Marzetta Board, DPM

## 2021-09-12 ENCOUNTER — Telehealth: Payer: Self-pay

## 2021-09-12 NOTE — Telephone Encounter (Signed)
Spoke with patient. She is currently having pain in lower part of abdomen and blood in her urine, that begin early this morning.   Dr. Barbie Banner first appointment was available is 09/21/21. Patient is to call office to get quicker appointment with any available provider.   Advised patient that if pain got worse to go to urgent care.

## 2021-09-12 NOTE — Telephone Encounter (Signed)
Patient called stating that she is seeing blood in her urine and would like a call back

## 2021-09-13 ENCOUNTER — Ambulatory Visit (INDEPENDENT_AMBULATORY_CARE_PROVIDER_SITE_OTHER): Payer: Medicare Other

## 2021-09-13 ENCOUNTER — Encounter: Payer: Self-pay | Admitting: Adult Health

## 2021-09-13 ENCOUNTER — Other Ambulatory Visit: Payer: Self-pay

## 2021-09-13 ENCOUNTER — Ambulatory Visit (INDEPENDENT_AMBULATORY_CARE_PROVIDER_SITE_OTHER): Payer: Medicare Other | Admitting: Adult Health

## 2021-09-13 VITALS — BP 140/78 | HR 81 | Temp 98.6°F | Ht 68.0 in | Wt 171.0 lb

## 2021-09-13 DIAGNOSIS — R1032 Left lower quadrant pain: Secondary | ICD-10-CM | POA: Diagnosis not present

## 2021-09-13 LAB — URINALYSIS, ROUTINE W REFLEX MICROSCOPIC
Bilirubin Urine: NEGATIVE
Ketones, ur: NEGATIVE
Nitrite: NEGATIVE
Specific Gravity, Urine: 1.015 (ref 1.000–1.030)
Total Protein, Urine: 30 — AB
Urine Glucose: NEGATIVE
Urobilinogen, UA: 0.2 (ref 0.0–1.0)
pH: 7 (ref 5.0–8.0)

## 2021-09-13 NOTE — Progress Notes (Signed)
Subjective:    Patient ID: Barbara [name], female    DOB: January 09, 1941, 80 y.o.   MRN: [id number]  HPI 80 year old female who  has a past medical history of A-fib ([place]), Allergy, CAD (coronary artery disease), CHF (congestive heart failure) ([place]), Colon polyps, Complication of anesthesia, Dyspnea, Dysrhythmia, GERD (gastroesophageal reflux disease), Heart murmur, History of stress test, echocardiogram, Hyperlipidemia, Hypertension, NSTEMI (non-ST elevated myocardial infarction) ([place]), Osteoarthritis, Pneumonia, and PONV (postoperative nausea and vomiting).  She presents to the office today for abdominal pain for about 24 hours. Yesterday morning she was in her usual health when around 9:30 yesterday morning she developed a sudden sharp pain in her left lower quadrant. She had a bowel movement after with loose stool. The pain continued all afternoon and continues today. About 2:30 yesterday afternoon she noticed possible blood in her urine, it was pink when she wiped. Hematuria continued until this morning.   She has not had a bowel movement this morning.   Has not noticed any blood in her stool  Has had nausea or vomiting.   Took pepto bismol yesterday which seemed to help.   She took a stool softener the night before her pain started.   Denies dysuria but has had frequency and urgency with urination over the last 24 hours    Review of Systems See HPI   Past Medical History:  Diagnosis Date   A-fib ([place])    Allergy    CAD (coronary artery disease)    sees Dr. Shelva Majestic  cardiac stents - 2000   CHF (congestive heart failure) ([place])    Colon polyps    Complication of anesthesia    rash/hives with "caines"   Dyspnea    02/12/18 " when my heart gets out of rhythm, it has not been out of rhythym- since I have been on Tikosyn (11/2017)   Dysrhythmia    afib fib   GERD (gastroesophageal reflux disease)    takes OTC- Omeprazole- prn   Heart murmur    History of stress test     show normal perfusion without scar or ischemia, post EF 68%   Hx of echocardiogram    show an EF 55%-60% range with grade 1 diastolic dysfunction, she had mitral anular calcification with mild MR, moderate LA dilation and mild pulmonary hypertension with a PA estimated pressure of 89m   Hyperlipidemia    Hypertension    NSTEMI (non-ST elevated myocardial infarction) ([place]    Osteoarthritis    Pneumonia    hx of 2015    PONV (postoperative nausea and vomiting)     Social History   Socioeconomic History   Marital status: Married    Spouse name: Not on file   Number of children: 2   Years of education: Not on file   Highest education level: Not on file  Occupational History   Not on file  Tobacco Use   Smoking status: Former    Years: 40.00    Types: Cigarettes    Quit date: 11/04/1996    Years since quitting: 24.8   Smokeless tobacco: Never  Vaping Use   Vaping Use: Never used  Substance and Sexual Activity   Alcohol use: No    Alcohol/week: 0.0 standard drinks   Drug use: No   Sexual activity: Not on file  Other Topics Concern   Not on file  Social History Narrative   Not on file   Social Determinants of Health   Financial  Resource Strain: Low Risk    Difficulty of Paying Living Expenses: Not hard at all  Food Insecurity: No Food Insecurity   Worried About Charity fundraiser in the Last Year: Never true   Ran Out of Food in the Last Year: Never true  Transportation Needs: No Transportation Needs   Lack of Transportation (Medical): No   Lack of Transportation (Non-Medical): No  Physical Activity: Inactive   Days of Exercise per Week: 0 days   Minutes of Exercise per Session: 0 min  Stress: No Stress Concern Present   Feeling of Stress : Only a little  Social Connections: Moderately Integrated   Frequency of Communication with Friends and Family: More than three times a week   Frequency of Social Gatherings with Friends and Family: More than three times a  week   Attends Religious Services: More than 4 times per year   Active Member of Clubs or Organizations: No   Attends Archivist Meetings: Never   Marital Status: Married  Human resources officer Violence: Not At Risk   Fear of Current or Ex-Partner: No   Emotionally Abused: No   Physically Abused: No   Sexually Abused: No    Past Surgical History:  Procedure Laterality Date   CARDIAC CATHETERIZATION     11/2017   cardiac stents  2000   COLONOSCOPY  01-05-14   per Dr. Teena Irani, clear, no repeats needed    COLONOSCOPY     CORONARY STENT PLACEMENT  2000   in [place] INJECTION N/A 02/13/2018   Procedure: [place];  Surgeon: Melida Quitter, MD;  Location: [facility];  Service: ENT;  Laterality: N/A;   EYE SURGERY Left    CATARACT REMOVAL   KNEE ARTHROSCOPY Left 01/06/2015   Procedure: LEFT KNEE ARTHROSCOPY, abrasion chondroplasty of the medial femerol condryl,medial and lateral menisectomy, microfracture , synovectomy of the suprpatellar pouch;  Surgeon: Latanya Maudlin, MD;  Location: [facility];  Service: Orthopedics;  Laterality: Left;   LEFT HEART CATH AND CORONARY ANGIOGRAPHY N/A 02/26/2019   Procedure: LEFT HEART CATH AND CORONARY ANGIOGRAPHY;  Surgeon: Troy Sine, MD;  Location: [facility];  Service: Cardiovascular;  Laterality: N/A;   MICROLARYNGOSCOPY W/VOCAL CORD INJECTION N/A 08/07/2018   Procedure: MICROLARYNGOSCOPY WITH VOCAL CORD INJECTION OF PROLARYN;  Surgeon: Melida Quitter, MD;  Location: [facility];  Service: ENT;  Laterality: N/A;  JET VENTILATION   REVERSE SHOULDER ARTHROPLASTY Right 03/23/2020   Procedure: REVERSE SHOULDER ARTHROPLASTY;  Surgeon: Nicholes Stairs, MD;  Location: [facility];  Service: Orthopedics;  Laterality: Right;   VAGINAL HYSTERECTOMY  1971    Family History  Problem Relation Age of Onset   Cancer Maternal Grandmother    Heart disease Maternal Grandfather     Allergies   Allergen Reactions   Lidocaine Anaphylaxis   Morphine Other (See Comments)    "Body shuts down"   Procaine Hcl Anaphylaxis, Rash and Other (See Comments)    "Anything with 'caine' in it "   Sulfonamide Derivatives Hives   Norco [Hydrocodone-Acetaminophen] Other (See Comments)    "Made me sick"   Tramadol Nausea Only   Vytorin [Ezetimibe-Simvastatin] Other (See Comments)    Unknown   Tape Itching and Other (See Comments)    Patient prefers either paper tape or Coban wrap    Current Outpatient Medications on File Prior to Visit  Medication Sig Dispense Refill   acetaminophen (TYLENOL) 500 MG tablet Take  1,000 mg by mouth at bedtime. As needed     amLODipine (NORVASC) 5 MG tablet Take 1 tablet (5 mg total) by mouth daily. 90 tablet 3   amoxicillin (AMOXIL) 500 MG tablet Take 2,000 mg by mouth See admin instructions. Take 4 tablets (2000 mg) by mouth one hour prior to dental appointments     apixaban (ELIQUIS) 5 MG TABS tablet Take 1 tablet (5 mg total) by mouth 2 (two) times daily. 180 tablet 1   BINAXNOW COVID-19 AG HOME TEST KIT Use as Directed on the Package     candesartan (ATACAND) 16 MG tablet TAKE 1 TABLET DAILY 90 tablet 3   Cholecalciferol (VITAMIN D3) 2000 units TABS Take 2,000 Units by mouth daily.     ciprofloxacin (CIPRO) 500 MG tablet Take 1 tablet (500 mg total) by mouth 2 (two) times daily. 14 tablet 0   COVID-19 At Home Test [facility] COVID-19 AG HOME TEST VI) BinaxNOW COVID-19 Ag Self Test kit  Use as Directed on the Package     dextromethorphan (DELSYM) 30 MG/5ML liquid Take 15 mg by mouth daily as needed for cough.     diclofenac Sodium (VOLTAREN) 1 % GEL Apply 1 application topically 4 (four) times daily as needed (knee pain).     docusate sodium (COLACE) 100 MG capsule Take 1 capsule (100 mg total) by mouth 2 (two) times daily. 60 capsule 5   dofetilide (TIKOSYN) 125 MCG capsule Take 1 capsule (125 mcg total) by mouth 2 (two) times daily. 180 capsule 2    HYDROcodone bit-homatropine (HYCODAN) 5-1.5 MG/5ML syrup      hypromellose (VISTA GEL DRY EYE RELIEF) 0.3 % GEL ophthalmic ointment Place 1 application into both eyes at bedtime as needed for dry eyes.      ipratropium (ATROVENT) 0.06 % nasal spray Place 2 sprays into both nostrils daily as needed for rhinitis.   5   ketoconazole (NIZORAL) 2 % cream Apply 1 application topically 2 (two) times daily as needed for irritation. 60 g 5   ketotifen (ZADITOR) 0.025 % ophthalmic solution Place 1 drop into both eyes 2 (two) times daily as needed (eye irritation/allergy).      loratadine (CLARITIN) 10 MG tablet Take 10 mg by mouth daily as needed (allergies/sinus issues).      Multiple Vitamin (MULTIVITAMIN WITH MINERALS) TABS tablet Take 1 tablet by mouth daily.     nitroGLYCERIN (NITROSTAT) 0.4 MG SL tablet DISSOLVE ONE TABLET UNDER THE TONGUE EVERY 5 MINUTES AS NEEDED FOR CHEST PAIN.  DO NOT EXCEED A TOTAL OF 3 DOSES IN 15 MINUTES 25 tablet 3   pantoprazole (PROTONIX) 40 MG tablet TAKE 1 TABLET DAILY 90 tablet 2   PRESCRIPTION MEDICATION See admin instructions. CPAP- At bedtime     rosuvastatin (CRESTOR) 40 MG tablet TAKE 1 TABLET DAILY 90 tablet 3   spironolactone (ALDACTONE) 25 MG tablet Take 0.5 tablets (12.5 mg total) by mouth daily. 45 tablet 3   triamcinolone (NASACORT) 55 MCG/ACT nasal inhaler Place 1 spray into both nostrils daily as needed (for allergies).      No current facility-administered medications on file prior to visit.    BP 140/78   Pulse 81   Temp 98.6 F (37 C) (Oral)   Ht _0  (1.727 m)   Wt 171 lb (77.6 kg)   SpO2 98%   BMI 26.00 kg/m       Objective:   Physical Exam Vitals and nursing note reviewed.  Cardiovascular:  Rate and Rhythm: Normal rate and regular rhythm.     Pulses: Normal pulses.     Heart sounds: Normal heart sounds.  Pulmonary:     Effort: Pulmonary effort is normal.     Breath sounds: Normal breath sounds.  Abdominal:     General: Abdomen  is flat. Bowel sounds are normal. There is no distension.     Palpations: Abdomen is soft. There is no mass.     Tenderness: There is abdominal tenderness in the left lower quadrant. There is no right CVA tenderness, left CVA tenderness, guarding or rebound.     Hernia: No hernia is present.      Assessment & Plan:  1. LLQ pain UTI vs kidney stone vs Constipation  - DG Abd 1 View; Future - Urinalysis; Future - Culture, Urine; Future  Dorothyann Peng, NP

## 2021-09-13 NOTE — Addendum Note (Signed)
Addended by: Rosalyn Gess D on: 09/13/2021 12:02 PM   Modules accepted: Orders

## 2021-09-14 LAB — URINE CULTURE
MICRO NUMBER:: 12620789
SPECIMEN QUALITY:: ADEQUATE

## 2021-09-24 LAB — HM MAMMOGRAPHY

## 2021-09-24 LAB — HM DEXA SCAN

## 2021-09-26 ENCOUNTER — Encounter: Payer: Self-pay | Admitting: Family Medicine

## 2021-10-01 ENCOUNTER — Encounter: Payer: Self-pay | Admitting: Family Medicine

## 2021-10-22 ENCOUNTER — Telehealth: Payer: Self-pay | Admitting: Family Medicine

## 2021-10-22 NOTE — Telephone Encounter (Signed)
Spoke to pt and relayed bone density results. Pt verbalized understanding.

## 2021-11-14 ENCOUNTER — Telehealth: Payer: Self-pay

## 2021-11-14 NOTE — Telephone Encounter (Signed)
Registration staff came to triage to relay that a patient was in the office lobby reporting a "rapid heart rate" and that her "husband recently died." I spoke with patient who stated, "My heart is racing." She denies chest pain or dizziness and wants to be seen. Appointment made with DOD for tomorrow at 11:30 am. Informed patient to go to ER if she begins to have dizziness, shortness of breath, or chest pain. She and daughter voiced understanding.

## 2021-11-14 NOTE — Progress Notes (Signed)
Cardiology Office Note   Date:  11/15/2021   ID:  [name], DOB 07/27/1941, MRN [id number]  PCP:  Laurey Morale, MD  Cardiologist:   Shelva Majestic, MD   Chief Complaint  Patient presents with   Palpitations      History of Present Illness: Barbara [name] is a 81 y.o. female who presents for evaluation of rapid heart rate.  She was actually in the lobby of our office yesterday with a rapid heart rate.  She was added urgently to my schedule today.  She was not dizzy.  An appointment was made for today.  She sees Dr. Claiborne Billings.  She has a long history of coronary artery disease.  She has had atrial flutter treated with Tikosyn in the past.   he underwent cardiac catheterization.  The results are as shown below and she was found to have nonobstructive CAD with a widely patent previously placed LAD stent and mild concomitant CAD.  She had LVH with a "Spade" like ventricle.   It turns out her husband died yesterday morning.  She feels stressed obviously because of this.  She is not describing symptoms consistent with her previous fibrillation.  She does not think she has been out of rhythm for that degree.  She has not been having any presyncope or syncope.  She has not been having any chest pain, neck or arm discomfort.  She has not been having any new shortness of breath, PND or orthopnea.  She just feels anxious and her chest on the phone has been ringing in the last day or so with the stress of the funeral coming on.    Past Surgical History:  Procedure Laterality Date   CARDIAC CATHETERIZATION     11/2017   cardiac stents  2000   COLONOSCOPY  01-05-14   per Dr. Teena Irani, clear, no repeats needed    COLONOSCOPY     CORONARY STENT PLACEMENT  2000   in LAD   DIRECT LARYNGOSCOPY WITH RADIAESSE INJECTION N/A 02/13/2018   Procedure: DIRECT LARYNGOSCOPY WITH RADIAESSE INJECTION;  Surgeon: Melida Quitter, MD;  Location: [facility];  Service: ENT;  Laterality: N/A;   EYE  SURGERY Left    CATARACT REMOVAL   KNEE ARTHROSCOPY Left 01/06/2015   Procedure: LEFT KNEE ARTHROSCOPY, abrasion chondroplasty of the medial femerol condryl,medial and lateral menisectomy, microfracture , synovectomy of the suprpatellar pouch;  Surgeon: Latanya Maudlin, MD;  Location: [facility];  Service: Orthopedics;  Laterality: Left;   LEFT HEART CATH AND CORONARY ANGIOGRAPHY N/A 02/26/2019   Procedure: LEFT HEART CATH AND CORONARY ANGIOGRAPHY;  Surgeon: Troy Sine, MD;  Location: [facility];  Service: Cardiovascular;  Laterality: N/A;   MICROLARYNGOSCOPY W/VOCAL CORD INJECTION N/A 08/07/2018   Procedure: MICROLARYNGOSCOPY WITH VOCAL CORD INJECTION OF PROLARYN;  Surgeon: Melida Quitter, MD;  Location: [facility];  Service: ENT;  Laterality: N/A;  JET VENTILATION   REVERSE SHOULDER ARTHROPLASTY Right 03/23/2020   Procedure: REVERSE SHOULDER ARTHROPLASTY;  Surgeon: Nicholes Stairs, MD;  Location: [facility];  Service: Orthopedics;  Laterality: Right;   VAGINAL HYSTERECTOMY  1971     Current Outpatient Medications  Medication Sig Dispense Refill   acetaminophen (TYLENOL) 500 MG tablet Take 1,000 mg by mouth at bedtime. As needed     amLODipine (NORVASC) 5 MG tablet Take 1 tablet (5 mg total) by mouth daily. 90 tablet 3   apixaban (ELIQUIS) 5 MG TABS tablet Take 1 tablet (5  mg total) by mouth 2 (two) times daily. 180 tablet 1   candesartan (ATACAND) 16 MG tablet TAKE 1 TABLET DAILY 90 tablet 3   Cholecalciferol (VITAMIN D3) 2000 units TABS Take 2,000 Units by mouth daily.     dextromethorphan (DELSYM) 30 MG/5ML liquid Take 15 mg by mouth daily as needed for cough.     diclofenac Sodium (VOLTAREN) 1 % GEL Apply 1 application topically 4 (four) times daily as needed (knee pain).     docusate sodium (COLACE) 100 MG capsule Take 1 capsule (100 mg total) by mouth 2 (two) times daily. 60 capsule 5   dofetilide (TIKOSYN) 125 MCG capsule Take 1 capsule (125 mcg total) by mouth 2 (two) times daily. 180  capsule 2   HYDROcodone bit-homatropine (HYCODAN) 5-1.5 MG/5ML syrup      hypromellose (VISTA GEL DRY EYE RELIEF) 0.3 % GEL ophthalmic ointment Place 1 application into both eyes at bedtime as needed for dry eyes.      ipratropium (ATROVENT) 0.06 % nasal spray Place 2 sprays into both nostrils daily as needed for rhinitis.   5   ketoconazole (NIZORAL) 2 % cream Apply 1 application topically 2 (two) times daily as needed for irritation. 60 g 5   ketotifen (ZADITOR) 0.025 % ophthalmic solution Place 1 drop into both eyes 2 (two) times daily as needed (eye irritation/allergy).      loratadine (CLARITIN) 10 MG tablet Take 10 mg by mouth daily as needed (allergies/sinus issues).      Multiple Vitamin (MULTIVITAMIN WITH MINERALS) TABS tablet Take 1 tablet by mouth daily.     nitroGLYCERIN (NITROSTAT) 0.4 MG SL tablet DISSOLVE ONE TABLET UNDER THE TONGUE EVERY 5 MINUTES AS NEEDED FOR CHEST PAIN.  DO NOT EXCEED A TOTAL OF 3 DOSES IN 15 MINUTES 25 tablet 3   pantoprazole (PROTONIX) 40 MG tablet TAKE 1 TABLET DAILY 90 tablet 2   PRESCRIPTION MEDICATION See admin instructions. CPAP- At bedtime     rosuvastatin (CRESTOR) 40 MG tablet TAKE 1 TABLET DAILY 90 tablet 3   spironolactone (ALDACTONE) 25 MG tablet Take 0.5 tablets (12.5 mg total) by mouth daily. 45 tablet 3   triamcinolone (NASACORT) 55 MCG/ACT nasal inhaler Place 1 spray into both nostrils daily as needed (for allergies).      amoxicillin (AMOXIL) 500 MG tablet Take 2,000 mg by mouth See admin instructions. Take 4 tablets (2000 mg) by mouth one hour prior to dental appointments (Patient not taking: Reported on 11/15/2021)     BINAXNOW COVID-19 AG HOME TEST KIT Use as Directed on the Package (Patient not taking: Reported on 11/15/2021)     ciprofloxacin (CIPRO) 500 MG tablet Take 1 tablet (500 mg total) by mouth 2 (two) times daily. (Patient not taking: Reported on 11/15/2021) 14 tablet 0   COVID-19 At Home Test [facility] COVID-19 AG HOME TEST VI)  BinaxNOW COVID-19 Ag Self Test kit  Use as Directed on the Package (Patient not taking: Reported on 11/15/2021)     No current facility-administered medications for this visit.    Allergies:   Lidocaine, Morphine, Procaine hcl, Sulfonamide derivatives, Norco [hydrocodone-acetaminophen], Tramadol, Vytorin [ezetimibe-simvastatin], and Tape    Social History:  The patient  reports that she quit smoking about 25 years ago. Her smoking use included cigarettes. She has never used smokeless tobacco. She reports that she does not drink alcohol and does not use drugs.   Family History:  The patient's family history includes Cancer in her maternal grandmother; Heart disease in  her maternal grandfather.    ROS:  Please see the history of present illness.   Otherwise, review of systems are positive for joint pains, abdominal discomfort.   All other systems are reviewed and negative.    PHYSICAL EXAM: VS:  BP (!) 145/78    Pulse (!) 59    Ht 5' 7" (1.702 m)    Wt 170 lb (77.1 kg)    SpO2 97%    BMI 26.63 kg/m  , BMI Body mass index is 26.63 kg/m. GENERAL:  Well appearing HEENT:  Pupils equal round and reactive, fundi not visualized, oral mucosa unremarkable NECK:  No jugular venous distention, waveform within normal limits, carotid upstroke brisk and symmetric, no bruits, no thyromegaly LYMPHATICS:  No cervical, inguinal adenopathy LUNGS:  Clear to auscultation bilaterally BACK:  No CVA tenderness CHEST:  Unremarkable HEART:  PMI not displaced or sustained,S1 and S2 within normal limits, no S3, no S4, no clicks, no rubs, soft apical systolic murmur at the apex and nonradiating, no diastolic murmurs ABD:  Flat, positive bowel sounds normal in frequency in pitch, no bruits, no rebound, no guarding, no midline pulsatile mass, no hepatomegaly, no splenomegaly EXT:  2 plus pulses throughout, no edema, no cyanosis no clubbing SKIN:  No rashes no nodules NEURO:  Cranial nerves II through XII grossly  intact, motor grossly intact throughout PSYCH:  Cognitively intact, oriented to person place and time    EKG:  EKG is ordered today. The ekg ordered today demonstrates sinus bradycardia, rate 59, left ventricular hypertrophy with repolarization changes, lateral T wave inversions unchanged from previous.   Recent Labs: 06/13/2021: ALT 12; Hemoglobin 13.2; Platelets 192.0; TSH 2.09 08/27/2021: BUN 33; Creatinine, Ser 1.50; Potassium 4.1; Sodium 140    Lipid Panel    Component Value Date/Time   CHOL 148 06/13/2021 1459   TRIG 244.0 (H) 06/13/2021 1459   HDL 57.60 06/13/2021 1459   CHOLHDL 3 06/13/2021 1459   VLDL 48.8 (H) 06/13/2021 1459   LDLCALC 50 10/08/2020 0313   LDLDIRECT 54.0 06/13/2021 1459      Wt Readings from Last 3 Encounters:  11/15/21 170 lb (77.1 kg)  09/13/21 171 lb (77.6 kg)  08/08/21 171 lb 3.2 oz (77.7 kg)      Other studies Reviewed: Additional studies/ records that were reviewed today include: Prior Dr. Claiborne Billings records. Review of the above records demonstrates:  Please see elsewhere in the note.     ASSESSMENT AND PLAN:  Atrial fib:   Patient is having palpitations but is not in atrial fibrillation.  I think she is just having anxiety related to the death of her husband yesterday.  I would not suggest a beta-blocker given her bradycardia.  I did send a message to Dr. Sarajane Jews to see if he would want to give a short course of benzodiazepines.   Hypertension:   Her blood pressure is very slightly elevated today but given the stress of her husband I would not suggest any change in therapy.    Situational stress:   As above.  I will defer to Dr. Sarajane Jews possibly a prescription for benzodiazepine.   CAD: She has no symptoms consistent with unstable angina.  No change in therapy.  Current medicines are reviewed at length with the patient today.  The patient does not have concerns regarding medicines.  The following changes have been made:  no change  Labs/ tests  ordered today include: None  Orders Placed This Encounter  Procedures   EKG  12-Lead     Disposition:   FU with Dr. Claiborne Billings in May has been scheduled.     Signed, Minus Breeding, MD  11/15/2021 12:54 PM    [facility]

## 2021-11-15 ENCOUNTER — Encounter: Payer: Self-pay | Admitting: Cardiology

## 2021-11-15 ENCOUNTER — Ambulatory Visit (INDEPENDENT_AMBULATORY_CARE_PROVIDER_SITE_OTHER): Payer: Medicare Other | Admitting: Cardiology

## 2021-11-15 ENCOUNTER — Other Ambulatory Visit: Payer: Self-pay

## 2021-11-15 VITALS — BP 145/78 | HR 59 | Ht 67.0 in | Wt 170.0 lb

## 2021-11-15 DIAGNOSIS — I2510 Atherosclerotic heart disease of native coronary artery without angina pectoris: Secondary | ICD-10-CM

## 2021-11-15 NOTE — Patient Instructions (Signed)
Medication Instructions:  Your physician recommends that you continue on your current medications as directed. Please refer to the Current Medication list given to you today.  *If you need a refill on your cardiac medications before your next appointment, please call your pharmacy*  Follow-Up: At [facility], you and your health needs are our priority.  As part of our continuing mission to provide you with exceptional heart care, we have created designated Provider Care Teams.  These Care Teams include your primary Cardiologist (physician) and Advanced Practice Providers (APPs -  Physician Assistants and Nurse Practitioners) who all work together to provide you with the care you need, when you need it.  We recommend signing up for the patient portal called "MyChart".  Sign up information is provided on this After Visit Summary.  MyChart is used to connect with patients for Virtual Visits (Telemedicine).  Patients are able to view lab/test results, encounter notes, upcoming appointments, etc.  Non-urgent messages can be sent to your provider as well.   To learn more about what you can do with MyChart, go to [email or web address].    Your next appointment:   3-4 months with Dr. Claiborne Billings

## 2021-11-16 ENCOUNTER — Telehealth: Payer: Self-pay | Admitting: Family Medicine

## 2021-11-16 MED ORDER — LORAZEPAM 0.5 MG PO TABS: 0.5000 mg | ORAL_TABLET | Freq: Four times a day (QID) | ORAL | 1 refills | Status: DC | PRN

## 2021-11-16 NOTE — Telephone Encounter (Signed)
I just heard about her husband passing away. Dr. Percival Spanish asked me to help her with the anxiety. Tell her I have sent in a RX for Lorazepam to her pharmacy to help with this

## 2021-11-23 NOTE — Telephone Encounter (Signed)
Spoke with pt states that she picked up Rx Lorazepam and is taking medication

## 2021-12-05 ENCOUNTER — Encounter: Payer: Self-pay | Admitting: Podiatry

## 2021-12-05 ENCOUNTER — Ambulatory Visit (INDEPENDENT_AMBULATORY_CARE_PROVIDER_SITE_OTHER): Payer: Medicare Other | Admitting: Podiatry

## 2021-12-05 ENCOUNTER — Other Ambulatory Visit: Payer: Self-pay

## 2021-12-05 DIAGNOSIS — B351 Tinea unguium: Secondary | ICD-10-CM | POA: Diagnosis not present

## 2021-12-05 DIAGNOSIS — M79675 Pain in left toe(s): Secondary | ICD-10-CM | POA: Diagnosis not present

## 2021-12-05 DIAGNOSIS — M79674 Pain in right toe(s): Secondary | ICD-10-CM

## 2021-12-06 ENCOUNTER — Other Ambulatory Visit: Payer: Self-pay | Admitting: Cardiovascular Disease

## 2021-12-07 NOTE — Telephone Encounter (Signed)
Prescription refill request for Eliquis received. Indication:Afib Last office visit:1/23 Scr:1.5 Age: 81 Weight:77.1 kg  May need to reduce Eliquis dose, please review

## 2021-12-11 NOTE — Progress Notes (Signed)
Subjective: Barbara [name] is a 81 y.o. female patient seen today for follow up of  painful elongated mycotic toenails 1-5 bilaterally which are tender when wearing enclosed shoe gear. Pain is relieved with periodic professional debridement..   New problem(s)/concern(s) today: None    Sadly, patient lost her husband last month.  PCP is Laurey Morale, MD. Last visit was: 08/01/2021.  Allergies  Allergen Reactions   Lidocaine Anaphylaxis   Morphine Other (See Comments)    "Body shuts down"   Procaine Hcl Anaphylaxis, Rash and Other (See Comments)    "Anything with 'caine' in it "   Sulfonamide Derivatives Hives   Norco [Hydrocodone-Acetaminophen] Other (See Comments)    "Made me sick"   Tramadol Nausea Only   Vytorin [Ezetimibe-Simvastatin] Other (See Comments)    Unknown   Tape Itching and Other (See Comments)    Patient prefers either paper tape or Coban wrap    Objective: Physical Exam  General: Patient is a pleasant 81 y.o. African American female WD, WN in NAD. AAO x 3.   Neurovascular Examination: CFT immediate b/l LE. Palpable DP/PT pulses b/l LE. Digital hair sparse b/l. Skin temperature gradient WNL b/l. No pain with calf compression b/l. No edema noted b/l. No cyanosis or clubbing noted b/l LE.  Protective sensation intact 5/5 intact bilaterally with 10g monofilament b/l.  Dermatological:  Pedal skin warm and supple b/l.  No open wounds b/l. No interdigital macerations. Toenails 1-5 b/l elongated, thickened, discolored with subungual debris. +Tenderness with dorsal palpation of nailplates. No hyperkeratotic nor porokeratotic lesions noted b/l.  Musculoskeletal:  Muscle strength 5/5 to all lower extremity muscle groups bilaterally. No pain, crepitus or joint limitation noted with ROM bilateral LE. No gross bony deformities bilaterally. Utilizes cane for ambulation assistance.  Assessment: 1. Pain due to onychomycosis of toenails of both feet    Plan: -No  new findings. No new orders. -Patient to continue soft, supportive shoe gear daily. -Mycotic toenails 1-5 bilaterally were debrided in length and girth with sterile nail nippers and dremel without incident. -Patient/POA to call should there be question/concern in the interim.  Return in about 3 months (around 03/04/2022).  Marzetta Board, DPM

## 2021-12-14 ENCOUNTER — Telehealth: Payer: Self-pay | Admitting: Cardiovascular Disease

## 2021-12-14 NOTE — Telephone Encounter (Signed)
LMTCB

## 2021-12-14 NOTE — Telephone Encounter (Signed)
[facility] is calling in regards to needing PA for pantoprazole (PROTONIX) 40 MG tablet. Ref # [id number] for callback. Please advise.

## 2021-12-14 NOTE — Telephone Encounter (Signed)
**Note De-Identified  Obfuscation** Per Erskine Speed with [facility] a Pantoprazole PA is required as it is covered for  #90 tablets per 365 day calender year.  I did the PA through covermymeds but received a message that Pantoprazole is covered by the plan an no PA is required.  I then called [facility] PA Desk at [phone] (as advised by [facility] if the PA was not approved) and did the Pantoprazole overvhe phone with [employer].  Per [employer] this PA is approved until 12/13/2022 and that [facility] mail pharmacy is aware as well.

## 2021-12-17 ENCOUNTER — Other Ambulatory Visit: Payer: Self-pay

## 2021-12-17 MED ORDER — PANTOPRAZOLE SODIUM 40 MG PO TBEC: 40.0000 mg | DELAYED_RELEASE_TABLET | Freq: Every day | ORAL | 1 refills | Status: DC

## 2021-12-17 NOTE — Telephone Encounter (Signed)
Called pt to clarify this message. 30 supply sent to [facility] for pt. She was instructed to call back if she does not receive her medication [facility]. She verbalized understanding.

## 2021-12-17 NOTE — Telephone Encounter (Signed)
Patient returning call. She states she is completely out of the medication now and wants to know if it can be sent to [facility] on [address].

## 2021-12-17 NOTE — Telephone Encounter (Signed)
Called pt to clarify this message. Refill for 30 day supply sent to [facility] per request. Pt advised to call us back if she does not receive her medication [facility]. She verbalized understanding.

## 2021-12-21 ENCOUNTER — Telehealth: Payer: Self-pay | Admitting: Cardiovascular Disease

## 2021-12-21 MED ORDER — PANTOPRAZOLE SODIUM 40 MG PO TBEC: 40.0000 mg | DELAYED_RELEASE_TABLET | Freq: Every day | ORAL | 1 refills | Status: DC

## 2021-12-21 NOTE — Telephone Encounter (Signed)
°*  STAT* If patient is at the pharmacy, call can be transferred to refill team.   1. Which medications need to be refilled? (please list name of each medication and dose if known) pantoprazole (PROTONIX) 40 MG tablet  2. Which pharmacy/location (including street and city if local pharmacy) is medication to be sent to? [place]   FAX # [phone]   0. Do they need a 30 day or 90 day supply? 90 day

## 2022-01-03 ENCOUNTER — Other Ambulatory Visit: Payer: Self-pay

## 2022-01-03 MED ORDER — ROSUVASTATIN CALCIUM 40 MG PO TABS: 40.0000 mg | ORAL_TABLET | Freq: Every day | ORAL | 3 refills | Status: DC

## 2022-01-03 MED ORDER — PANTOPRAZOLE SODIUM 40 MG PO TBEC: 40.0000 mg | DELAYED_RELEASE_TABLET | Freq: Every day | ORAL | 10 refills | Status: DC

## 2022-01-08 ENCOUNTER — Ambulatory Visit (INDEPENDENT_AMBULATORY_CARE_PROVIDER_SITE_OTHER): Payer: Medicare Other | Admitting: Family Medicine

## 2022-01-08 ENCOUNTER — Encounter: Payer: Self-pay | Admitting: Family Medicine

## 2022-01-08 VITALS — BP 142/80 | HR 53 | Temp 99.1°F | Wt 175.0 lb

## 2022-01-08 DIAGNOSIS — R143 Flatulence: Secondary | ICD-10-CM

## 2022-01-08 NOTE — Progress Notes (Signed)
? ?  Subjective:  ? ? Patient ID: Barbara [name], female    DOB: 1941-06-24, 81 y.o.   MRN: [id number] ? ?HPI ?Here asking for help with excessive passing of intestinal gas. This started about 6 months ago, and she deals with it daily. There is no abdominal pain. Her stools are formed but not hard, and she averages passing a BM every 1-2 days. No belching or nausea. She takes an OTC stool softener. Her last colonoscopy in 2015 was normal. Her appetite is good and her weight has been stable.  ? ? ?Review of Systems  ?Constitutional: Negative.   ?Respiratory: Negative.    ?Cardiovascular: Negative.   ?Gastrointestinal:  Negative for abdominal distention, abdominal pain, blood in stool, constipation, diarrhea, nausea, rectal pain and vomiting.  ? ?   ?Objective:  ? Physical Exam ?Constitutional:   ?   Appearance: Normal appearance. She is not ill-appearing.  ?Cardiovascular:  ?   Rate and Rhythm: Normal rate and regular rhythm.  ?   Pulses: Normal pulses.  ?   Heart sounds: Normal heart sounds.  ?Pulmonary:  ?   Effort: Pulmonary effort is normal.  ?   Breath sounds: Normal breath sounds.  ?Abdominal:  ?   General: Abdomen is flat. Bowel sounds are normal. There is no distension.  ?   Palpations: Abdomen is soft. There is no mass.  ?   Tenderness: There is no abdominal tenderness. There is no right CVA tenderness, left CVA tenderness, guarding or rebound.  ?   Hernia: No hernia is present.  ?Neurological:  ?   Mental Status: She is alert.  ? ? ? ? ? ?   ?Assessment & Plan:  ?Excessive flatus. I think taking a probiotic would help, so I suggested she take Align every day. Recheck as needed.  ?Alysia Penna, MD ? ? ?

## 2022-01-23 DIAGNOSIS — G4733 Obstructive sleep apnea (adult) (pediatric): Secondary | ICD-10-CM | POA: Diagnosis not present

## 2022-02-11 ENCOUNTER — Other Ambulatory Visit: Payer: Self-pay

## 2022-02-11 MED ORDER — APIXABAN 2.5 MG PO TABS: 2.5000 mg | ORAL_TABLET | Freq: Two times a day (BID) | ORAL | 5 refills | Status: DC

## 2022-02-11 NOTE — Telephone Encounter (Signed)
Eliquis 2.5 mg refill request received. Patient is 81 years old, weight- 79.4 kg, Crea- 1.5 on 08/27/21 , Diagnosis- PAF, and last seen by Dr. Percival Spanish on 11/15/21. Dose is appropriate based on dosing criteria. Will send in refill to requested pharmacy.   ?

## 2022-02-11 NOTE — Telephone Encounter (Signed)
Prescription refill request for Eliquis received. ?Indication:Afib ?Last office visit:1/23 ?Scr:1.5 ?Age: 81 ?Weight:79.4 kg ? ?Prescription refilled ? ?

## 2022-02-26 DIAGNOSIS — G4733 Obstructive sleep apnea (adult) (pediatric): Secondary | ICD-10-CM | POA: Diagnosis not present

## 2022-03-06 ENCOUNTER — Encounter: Payer: Self-pay | Admitting: Podiatry

## 2022-03-06 ENCOUNTER — Telehealth: Payer: Self-pay | Admitting: Cardiovascular Disease

## 2022-03-06 ENCOUNTER — Ambulatory Visit: Payer: Medicare Other | Admitting: Podiatry

## 2022-03-06 DIAGNOSIS — M79675 Pain in left toe(s): Secondary | ICD-10-CM

## 2022-03-06 DIAGNOSIS — M79674 Pain in right toe(s): Secondary | ICD-10-CM

## 2022-03-06 DIAGNOSIS — B351 Tinea unguium: Secondary | ICD-10-CM

## 2022-03-06 MED ORDER — SPIRONOLACTONE 25 MG PO TABS: 12.5000 mg | ORAL_TABLET | Freq: Every day | ORAL | 0 refills | Status: DC

## 2022-03-06 NOTE — Telephone Encounter (Signed)
?*  STAT* If patient is at the pharmacy, call can be transferred to refill team. ? ? ?1. Which medications need to be refilled? (please list name of each medication and dose if known) spironolactone (ALDACTONE) 25 MG tablet ? ?2. Which pharmacy/location (including street and city if local pharmacy) is medication to be sent to? [facility] ([place], [place] ? ?3. Do they need a 30 day or 90 day supply? 90 day  ?

## 2022-03-13 ENCOUNTER — Encounter: Payer: Self-pay | Admitting: Cardiovascular Disease

## 2022-03-13 ENCOUNTER — Ambulatory Visit: Payer: Medicare Other | Admitting: Cardiovascular Disease

## 2022-03-13 VITALS — BP 160/66 | HR 47 | Ht 67.0 in | Wt 177.0 lb

## 2022-03-13 DIAGNOSIS — I422 Other hypertrophic cardiomyopathy: Secondary | ICD-10-CM

## 2022-03-13 DIAGNOSIS — Z7901 Long term (current) use of anticoagulants: Secondary | ICD-10-CM

## 2022-03-13 DIAGNOSIS — I4819 Other persistent atrial fibrillation: Secondary | ICD-10-CM

## 2022-03-13 DIAGNOSIS — M25473 Effusion, unspecified ankle: Secondary | ICD-10-CM

## 2022-03-13 DIAGNOSIS — I10 Essential (primary) hypertension: Secondary | ICD-10-CM

## 2022-03-13 DIAGNOSIS — G4733 Obstructive sleep apnea (adult) (pediatric): Secondary | ICD-10-CM | POA: Diagnosis not present

## 2022-03-13 DIAGNOSIS — N1832 Chronic kidney disease, stage 3b: Secondary | ICD-10-CM

## 2022-03-13 DIAGNOSIS — Z9989 Dependence on other enabling machines and devices: Secondary | ICD-10-CM | POA: Diagnosis not present

## 2022-03-13 DIAGNOSIS — E785 Hyperlipidemia, unspecified: Secondary | ICD-10-CM | POA: Diagnosis not present

## 2022-03-13 DIAGNOSIS — I480 Paroxysmal atrial fibrillation: Secondary | ICD-10-CM

## 2022-03-13 MED ORDER — SPIRONOLACTONE 25 MG PO TABS: ORAL_TABLET | ORAL | 3 refills | Status: DC

## 2022-03-13 NOTE — Patient Instructions (Signed)
Medication Instructions:  ?Increase Spironolactone 25 mg  Take 1/2 tablet twice a day on even days next day take 1/2 tablet once a day on odd day ?Continue all other medication ?*If you need a refill on your cardiac medications before your next appointment, please call your pharmacy* ? ? ?Lab Work: ?[facility] in 2 weeks 5/25 ? ? ?Testing/Procedures: ?None ordered ? ? ?Follow-Up: ?At [facility], you and your health needs are our priority.  As part of our continuing mission to provide you with exceptional heart care, we have created designated Provider Care Teams.  These Care Teams include your primary Cardiologist (physician) and Advanced Practice Providers (APPs -  Physician Assistants and Nurse Practitioners) who all work together to provide you with the care you need, when you need it. ? ?We recommend signing up for the patient portal called "MyChart".  Sign up information is provided on this After Visit Summary.  MyChart is used to connect with patients for Virtual Visits (Telemedicine).  Patients are able to view lab/test results, encounter notes, upcoming appointments, etc.  Non-urgent messages can be sent to your provider as well.   ?To learn more about what you can do with MyChart, go to [email or web address].   ? ?Your next appointment:  6 months ?  ? ?The format for your next appointment: Office   ? ? ?Provider:  Dr.Kelly ? ? ?Important Information About Sugar ? ? ? ? ? ? ?

## 2022-03-13 NOTE — Progress Notes (Signed)
Cardiology Office Note    Date:  03/23/2022   ID:  Barbara [name], Barbara [name] 1941/06/05, MRN [id number]  PCP:  Laurey Morale, MD  Cardiologist:  Shelva Majestic, MD   8 month F/U follow-up evaluation  History of Present Illness:  Barbara [name] is a 81 y.o. female who has CAD and in November 2000 underwent stenting of a high-grade LAD stenosis with an [id number] 3.518 mm bare-metal stent. A nuclear perfusion study in November 2013 was unchanged from previously and continued to show normal perfusion without scar or ischemia. Ejection fraction was 68%. An echo Doppler study revealed an ejection fraction in the 55-60% range with grade 1 diastolic dysfunction. 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   Additional problems include hypertension as well as hyperlipidemia. Over the past several months she has noticed that she is more tired.  She also has noticed more shortness of breath with activity.  She has noted some vague indigestion symptoms.  She admits to some mild ankle swelling.  She has been taking Crestor 10 mg and denies myalgias.  She has been on losartan HCT 100/25 as well as amlodipine 10 mg and Lasix 20 mg for blood pressure and peripheral edema.   When I saw her in November 2015 her blood pressure was controlled.  She was bradycardic not on any rate control medication and I raise the possibility of a component of chronotropic incompetence.  To further evaluate her exertional dyspnea she underwent a nuclear perfusion study on 10/06/2014 which was normal.  Post stress ejection fraction was 67%.  An echo Doppler study done on 10/06/2014 showed an EF of 60-65% with moderate left ventricular hypertrophy.  There was grade 1 diastolic dysfunction.  She had indeterminate LV filling pressure.  There was mild aortic sclerosis without stenosis, mitral annular calcification with mild MR, and mild  dilatation of her left atrium with mild tricuspid regurgitation.  Pulmonary pressures were minimally elevated at 31 mm.   She underwent successful left knee surgery by Dr. Lindwood Qua in March 2016.  She tolerated surgery well from a cardiovascular standpoint.   An echo Doppler study in March 2017 showed mild LVH with vigorous LV function with an EF of 65-70%.  There was grade 2 diastolic dysfunction.  There was moderate aortic sclerosis without stenosis.  There was mild LA dilation.  PA pressures were upper normal.   When I saw her in May 2018, she had noticed issues with ankle swelling.  She had stopped taking furosemide since his had expired.  I elected to institute spironolactone with her moderate diastolic dysfunction.  She was taking amlodipine 10 mg, losartan HCT 100/25 mg and she has been on spironolactone 25 mg daily.     At her evaluation in August 2018 she was in sinus rhythm and had bilateral ankle and feet swelling.  I reduced her amlodipine to 5 mg and further titrated spironolactone to 25 mg twice a day.  Recently she has been feeling well.  She went to [place] over Christmas and did well.  However, since 11/05/2017 she noticed her heart rate being a little faster and subsequently felt some irregularity.  I  I saw her in the office on 11/10/2017 and she was in atrial flutter with variable block. Her blood pressure was elevated.  I started metoprolol 25 mg twice a day and started eliquis 5 mg twice a day.  An echo  Doppler study on November 17, 2017 showed an EF of 70-75% with grade 2 diastolic dysfunction.  There was mild to moderate MR, moderately severe LA enlargement, and PA pressure was increased at 53 mm.  She was hospitalized and underwent Tikosyn loading with Dr. Rayann Heman, which ultimately pharmacologically cardioverted her back to sinus rhythm.  She was seen in follow-up in the atrial fibrillation clinic on 12/01/2017.  At that time, she was maintaining sinus rhythm and her QT interval  was stable.     I saw her on December 03, 2017 at which time she was doing well.  Subsequently she has seen Dr. Rayann Heman.  Has had issues with hoarseness and was referred to Dr. Redmond Baseman for ENT evaluation.  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  She is scheduled to undergo a CT of the soft tissue of her neck with contrast on January 20, 2018.  She was recently notified that her valsartan has a contaminant.  She denies any awareness of recurrent atrial fibrillation.  She denies significant swelling.  She has been maintained on furosemide 40 mg daily, losartan 50 mg for hypertension.  She denies bleeding on Eliquis 5 mg twice daily.  She continues to be on rosuvastatin for hyperlipidemia.  She is on Tikosyn 125 mcg twice a day.     I saw her in the office in March 2019, she has continued to do well.  Due to concerns for obstructive sleep apnea she was referred for a sleep study.  This was done in December 2019 revealed severe sleep apnea with an AHI at 37/h, with REM sleep AHI at 52/h in supine sleep AHI at 47/h.  She had significant oxygen desaturation to a nadir of 77%.  CPAP was instituted and she was titrated up to 11 cm.  She was started on CPAP therapy in 2020 with [facility] [employer] as her DME company.  A most recent download from April 7 through Mar 10, 2019 shows that she is meeting compliance standards with 90% of usage days.  She is averaging 6 hours and 42 minutes of usage per night.  At 10 cm water pressure AHI is excellent at 2.9.  There is no leak.  She now has nasal pillows, but previously it had a full facemask which she did not tolerate.   She developed episodes of chest pain and increasing shortness of breath leading to her hospitalization in April 2020.  Was mild troponin elevation and she had inferolateral T wave abnormalities.  She underwent cardiac catheterization.  The results are as shown below and she was found to have nonobstructive CAD with a widely patent previously placed  LAD stent and mild concomitant CAD.  She had LVH with a "Spade" like ventricle.   She was evaluated by me on Mar 11, 2019 in a telemedicine visit.  At that time she denied any chest pain but admitted to some shortness of breath.  She was sleeping better.  However she was still not sleeping long and often only averaging between 6 to 7 hours of sleep per night which resulted in some fatigue during the day.  She was unaware of breakthrough snoring.  A download was obtained which showed an AHI of 2.9 at 10 cm set pressure.  She was evaluated by Charyl Dancer in December 2020 in our office.  She continued to do well without chest pain.  Her blood pressure was elevated.  She was maintaining sinus rhythm on Tikosyn 125 mg twice  a day as well as Eliquis 5 mg twice a day.  ECG showed sinus bradycardia at 54 bpm.  I saw her Mar 31, 2020.  Prior to that evaluation she had undergone  shoulder surgery on Mar 23, 2020 the right reverse shoulder arthroplasty by Dr. Stann Mainland for advanced rotator cuff arthropathy and multiple tears.  His surgery well from a cardiac standpoint.  Since surgery she has been in the sling and during this time has not been able to use CPAP.  She will be having a follow-up orthopedic visit over the next several days and hopefully the sling will be taken off and CPAP can be reinstituted.  She denied any recent chest tightness or pressure.  She was unaware of palpitations.  I saw her in September 2021.  Over the last several months, she felt well.  She has gained mobility back to her right shoulder.  She again has resumed CPAP therapy.  A download was obtained from August 2 through July 04, 2020.  She had undergone an echo Doppler study which showed hyperdynamic LV function with ejection fraction at 70 to 75%.  There was mild aortic sclerosis without stenosis.  Estimated RV systolic pressure was mildly increased at 35.9.  She had apical hypertrophy with obliteration of LV cavity in systole and apex  suggestive for apical hypertrophic cardiomyopathy.  She was without chest pain or shortness of breath.   She was hospitalized in December 2021 with atrial fibrillation with RVR.  She was restarted on Eliquis.  She was felt to have demand ischemia.  Tikosyn was restarted.  She was seen by EP who recommended she wear a Zio patch which revealed frequent PACs, nonsustained atrial tachycardia and a disorganized atrial activity without recurrent atrial fibrillation.  She was seen by Jory Sims, NP in January 2022.  She was caring for her husband and was not using her CPAP.  I saw her in March 2022 at which time she remained stable.  She denied any chest pain or awareness of recurrent atrial fibrillation.  She was inconsistent with CPAP use.  Her husband was ill and was hospitalized and often times she was caring for her husband between 1 AM and 5 AM prior to his hospitalization the day prior to her evaluation with me.  At 10 cm water pressure AHI was 2.1 with average use at 6 hours and 22 minutes.  She continues to be on Eliquis for anticoagulation without bleeding.  Her blood pressure remained stable.  She continued to be on rosuvastatin 40 mg with LDL at 50 in December 2021. She denies any chest pain or awareness of recurrent A. fib.  Her husband was apparently hospitalized yesterday.  She has not been consistently using CPAP for long duration since oftentimes she was caring for her husband from 1 AM to 5 AM prior to his hospitalization.  However we did obtain a download in the office from February 14 through January 16, 2021.  Usage was 70% with average usage 6 hours and 22 minutes.  At a 10 cm set pressure, AHI was 2.1.  She has continued to be on amlodipine 5 mg, candesartan 16 mg, and spironolactone 25 mg for hypertension.  She is tolerating Tikosyn 125 mg twice a day.  She was on rosuvastatin for hyperlipidemia.   I last saw her on August 08, 2021 at which time she remained cardiac stable.  She recently  was found to have a urinary tract infection has been on antibiotics by Dr. Annie Main  Sarajane Jews.  She continues to use CPAP therapy but over the past month she had reduced compliance.  With average use just under 6 hours per night.  AHI is 1.4.  She had laboratory by her primary physician in August 2022.  Creatinine was 1.60.  She denies any chest pain, awareness of recurrent atrial fibrillation, or leg swelling.    Since I last saw her, unfortunately her husband died on 28-Nov-2021.  She has noticed blood pressure elevation.  She also admits to some mild ankle swelling.  She denies any chest pain.  She has been using CPAP and typically goes to bed around 1130 and wakes up at 8 AM.  A download was obtained from April 10 through Mar 12, 2022.  She was averaging 6 hours and 52 minutes.  Usage, however was reduced at only 63% due to sinus congestion issues.Marland Kitchen  AHI was 2.3.  She presents for reevaluation.  Today 0 Past Medical History:  Diagnosis Date   A-fib [facility])    Allergy    CAD (coronary artery disease)    sees Dr. Shelva Majestic  cardiac stents - 2000   CHF (congestive heart failure) ([place])    Colon polyps    Complication of anesthesia    rash/hives with "caines"   Dyspnea    02/12/18 " when my heart gets out of rhythm, it has not been out of rhythym- since I have been on Tikosyn (11/2017)   Dysrhythmia    afib fib   GERD (gastroesophageal reflux disease)    takes OTC- Omeprazole- prn   Heart murmur    History of stress test    show normal perfusion without scar or ischemia, post EF 68%   Hx of echocardiogram    show an EF 55%-60% range with grade 1 diastolic dysfunction, she had mitral anular calcification with mild MR, moderate LA dilation and mild pulmonary hypertension with a PA estimated pressure of 89m   Hyperlipidemia    Hypertension    [place]ST [place]    Osteoarthritis    Pneumonia    hx of 2015    PONV (postoperative nausea and vomiting)      Past Surgical History:  Procedure Laterality Date   CARDIAC CATHETERIZATION     11/2017   cardiac stents  2000   COLONOSCOPY  01-05-14   per Dr. JTeena Irani clear, no repeats needed    COLONOSCOPY     CORONARY STENT PLACEMENT  2000   in LAD   DIRECT LARYNGOSCOPY WITH RADIAESSE INJECTION N/A 02/13/2018   Procedure: DIRECT LARYNGOSCOPY WITH RADIAESSE INJECTION;  Surgeon: BMelida Quitter MD;  [place]  Service: ENT;  Laterality: N/A;   EYE SURGERY Left    CATARACT REMOVAL   KNEE ARTHROSCOPY Left 01/06/2015   Procedure: LEFT KNEE ARTHROSCOPY, abrasion chondroplasty of the medial femerol condryl,medial and lateral menisectomy, microfracture , synovectomy of the suprpatellar pouch;  Surgeon: RLatanya Maudlin MD;  Location: [facility];  Service: Orthopedics;  Laterality: Left;   LEFT HEART CATH AND CORONARY ANGIOGRAPHY N/A 02/26/2019   Procedure: LEFT HEART CATH AND CORONARY ANGIOGRAPHY;  Surgeon: KTroy Sine MD;  [place] [facility];  Service: Cardiovascular;  Laterality: N/A;   MICROLARYNGOSCOPY W/VOCAL CORD INJECTION N/A 08/07/2018   Procedure: MICROLARYNGOSCOPY WITH VOCAL CORD INJECTION OF PROLARYN;  Surgeon: BMelida Quitter MD;  [place]  Service: ENT;  Laterality: N/A;  JET VENTILATION   REVERSE SHOULDER ARTHROPLASTY Right 03/23/2020   Procedure:  REVERSE SHOULDER ARTHROPLASTY;  Surgeon: Nicholes Stairs, MD;  Location: [place];  Service: Orthopedics;  Laterality: Right;   VAGINAL HYSTERECTOMY  1971    Current Medications: Outpatient Medications Prior to Visit  Medication Sig Dispense Refill   acetaminophen (TYLENOL) 500 MG tablet Take 1,000 mg by mouth at bedtime. As needed     amLODipine (NORVASC) 5 MG tablet Take 1 tablet (5 mg total) by mouth daily. 90 tablet 3   apixaban (ELIQUIS) 2.5 MG TABS tablet Take 1 tablet (2.5 mg total) by mouth 2 (two) times daily. 60 tablet 5   Cholecalciferol (VITAMIN D3) 2000 units TABS Take 2,000 Units by mouth daily.      dextromethorphan (DELSYM) 30 MG/5ML liquid Take 15 mg by mouth daily as needed for cough.     docusate sodium (COLACE) 100 MG capsule Take 1 capsule (100 mg total) by mouth 2 (two) times daily. (Patient taking differently: Take 100 mg by mouth 2 (two) times daily as needed.) 60 capsule 5   ipratropium (ATROVENT) 0.06 % nasal spray Place 2 sprays into both nostrils daily as needed for rhinitis.   5   ketoconazole (NIZORAL) 2 % cream Apply 1 application topically 2 (two) times daily as needed for irritation. 60 g 5   ketotifen (ZADITOR) 0.025 % ophthalmic solution Place 1 drop into both eyes 2 (two) times daily as needed (eye irritation/allergy).      loratadine (CLARITIN) 10 MG tablet Take 10 mg by mouth daily as needed (allergies/sinus issues).      LORazepam (ATIVAN) 0.5 MG tablet Take 1 tablet (0.5 mg total) by mouth every 6 (six) hours as needed for anxiety. 60 tablet 1   Multiple Vitamin (MULTIVITAMIN WITH MINERALS) TABS tablet Take 1 tablet by mouth daily.     nitroGLYCERIN (NITROSTAT) 0.4 MG SL tablet DISSOLVE ONE TABLET UNDER THE TONGUE EVERY 5 MINUTES AS NEEDED FOR CHEST PAIN.  DO NOT EXCEED A TOTAL OF 3 DOSES IN 15 MINUTES 25 tablet 3   pantoprazole (PROTONIX) 40 MG tablet Take 1 tablet (40 mg total) by mouth daily. 30 tablet 10   PRESCRIPTION MEDICATION See admin instructions. CPAP- At bedtime     Probiotic Product (ALIGN PO) Take by mouth daily.     rosuvastatin (CRESTOR) 40 MG tablet Take 1 tablet (40 mg total) by mouth daily. 90 tablet 3   triamcinolone (NASACORT) 55 MCG/ACT nasal inhaler Place 1 spray into both nostrils daily as needed (for allergies).      candesartan (ATACAND) 16 MG tablet TAKE 1 TABLET DAILY 90 tablet 3   dofetilide (TIKOSYN) 125 MCG capsule Take 1 capsule (125 mcg total) by mouth 2 (two) times daily. 180 capsule 2   spironolactone (ALDACTONE) 25 MG tablet Take 0.5 tablets (12.5 mg total) by mouth daily. 45 tablet 0   BINAXNOW COVID-19 AG HOME TEST KIT       COVID-19 At Home Test [facility] COVID-19 AG HOME TEST VI)      diclofenac Sodium (VOLTAREN) 1 % GEL Apply 1 application topically 4 (four) times daily as needed (knee pain). (Patient not taking: Reported on 03/13/2022)     HYDROcodone bit-homatropine (HYCODAN) 5-1.5 MG/5ML syrup  (Patient not taking: Reported on 03/13/2022)     hypromellose (VISTA GEL DRY EYE RELIEF) 0.3 % GEL ophthalmic ointment Place 1 application into both eyes at bedtime as needed for dry eyes.  (Patient not taking: Reported on 03/13/2022)     No facility-administered medications prior to visit.  Allergies:   Lidocaine, Morphine, Procaine hcl, Sulfonamide derivatives, Norco [hydrocodone-acetaminophen], Tramadol, Vytorin [ezetimibe-simvastatin], and Tape   Social History   Socioeconomic History   Marital status: Married    Spouse name: Not on file   Number of children: 2   Years of education: Not on file   Highest education level: Not on file  Occupational History   Not on file  Tobacco Use   Smoking status: Former    Years: 40.00    Types: Cigarettes    Quit date: 11/04/1996    Years since quitting: 25.3   Smokeless tobacco: Never  Vaping Use   Vaping Use: Never used  Substance and Sexual Activity   Alcohol use: No    Alcohol/week: 0.0 standard drinks   Drug use: No   Sexual activity: Not on file  Other Topics Concern   Not on file  Social History Narrative   Not on file   Social Determinants of Health   Financial Resource Strain: Low Risk    Difficulty of Paying Living Expenses: Not hard at all  Food Insecurity: No Food Insecurity   Worried About Charity fundraiser in the Last Year: Never true   [place] in the Last Year: Never true  Transportation Needs: No Transportation Needs   Lack of Transportation (Medical): No   Lack of Transportation (Non-Medical): No  Physical Activity: Inactive   Days of Exercise per Week: 0 days   Minutes of Exercise per Session: 0 min  Stress: No Stress  Concern Present   Feeling of Stress : Only a little  Social Connections: Moderately Integrated   Frequency of Communication with Friends and Family: More than three times a week   Frequency of Social Gatherings with Friends and Family: More than three times a week   Attends Religious Services: More than 4 times per year   Active Member of [employer] or Organizations: No   Attends Archivist Meetings: Never   Marital Status: Married     Family History:  The patient'sfamily history includes Cancer in her maternal grandmother; Heart disease in her maternal grandfather.   ROS General: Negative; No fevers, chills, or night sweats;  HEENT: Negative; No changes in vision or hearing, sinus congestion, difficulty swallowing Pulmonary: Negative; No cough, wheezing, shortness of breath, hemoptysis Cardiovascular: See HPI GI: Negative; No nausea, vomiting, diarrhea, or abdominal pain GU: Negative; No dysuria, hematuria, or difficulty voiding Musculoskeletal: Right shoulder surgery May 2021 Hematologic/Oncology: Negative; no easy bruising, bleeding Endocrine: Negative; no heat/cold intolerance; no diabetes Neuro: Negative; no changes in balance, headaches Skin: Negative; No rashes or skin lesions Psychiatric: Negative; No behavioral problems, depression Sleep: Negative; No snoring, daytime sleepiness, hypersomnolence, bruxism, restless legs, hypnogognic hallucinations, no cataplexy Other comprehensive 14 point system review is negative.   PHYSICAL EXAM:   VS:  BP (!) 160/66   Pulse (!) 47   Ht '5\' 7"'  (1.702 m)   Wt 177 lb (80.3 kg)   SpO2 98%   BMI 27.72 kg/m    Repeat blood pressure by me was 142/64  Wt Readings from Last 3 Encounters:  03/13/22 177 lb (80.3 kg)  01/08/22 175 lb (79.4 kg)  11/15/21 170 lb (77.1 kg)     General: Alert, oriented, no distress.  Skin: normal turgor, no rashes, warm and dry HEENT: Normocephalic, atraumatic. Pupils equal round and reactive to  light; sclera anicteric; extraocular muscles intact;  Nose without nasal septal hypertrophy Mouth/Parynx benign; Mallinpatti scale 3 Neck: No  JVD, no carotid bruits; normal carotid upstroke Lungs: clear to ausculatation and percussion; no wheezing or rales Chest wall: without tenderness to palpitation Heart: PMI not displaced, RRR, s1 s2 normal, 1/6 systolic murmur, no diastolic murmur, no rubs, gallops, thrills, or heaves Abdomen: soft, nontender; no hepatosplenomehaly, BS+; abdominal aorta nontender and not dilated by palpation. Back: no CVA tenderness Pulses 2+ Musculoskeletal: full range of motion, normal strength, no joint deformities Extremities: Mild ankle swelling, no clubbing cyanosis, Homan's sign negative  Neurologic: grossly nonfocal; Cranial nerves grossly wnl Psychologic: Normal mood and affect   Studies/Labs Reviewed:   May 10, 2023ECG (independently read by me):  Sinus bradycardia at 47, LVH, T wave abbnormality  August 08, 2021 ECG (independently read by me): Sinus bradycardia at 56; LVH with repolarization, QTc 428 msec  January 17, 2021 ECG (independently read by me): Sinus bradycardia at 56, LVH with prior T wave abnormalities; QTc 441 msec  September 2021 ECG (independently read by me): Sinus bradycardia at 54; LVHwih T wave abnormality inferolaterally  I personally reviewed the ECG from October 26, 2019 which showed sinus bradycardia at 54 bpm, LVH with repolarization abnormality.  QTc interval was 398 ms.  Recent Labs:    Latest Ref Rng & Units 08/27/2021   11:29 AM 06/13/2021    2:59 PM 02/07/2021   10:03 AM  BMP  Glucose 70 - 99 mg/dL 99   91     BUN 6 - 23 mg/dL 33   24     Creatinine 0.40 - 1.20 mg/dL 1.50   1.60     Sodium 135 - 145 mEq/L 140   139     Potassium 3.5 - 5.1 mEq/L 4.1   4.6     Chloride 96 - 112 mEq/L 107   103     CO2 19 - 32 mEq/L 25   25     Calcium 8.4 - 10.5 mg/dL 10.3   10.5   10.2          Latest Ref Rng & Units 06/13/2021     2:59 PM 10/09/2020    2:34 AM 10/08/2020    9:20 AM  Hepatic Function  Total Protein 6.0 - 8.3 g/dL 7.0   6.5   6.5    Albumin 3.5 - 5.2 g/dL 4.3   3.5   3.8    AST 0 - 37 U/L '19   22   27    ' ALT 0 - 35 U/L '12   16   15    ' Alk Phosphatase 39 - 117 U/L 82   66   57    Total Bilirubin 0.2 - 1.2 mg/dL 0.5   0.7   0.7    Bilirubin, Direct 0.0 - 0.3 mg/dL 0.1           Latest Ref Rng & Units 06/13/2021    2:59 PM 10/12/2020    4:21 AM 10/11/2020    4:49 AM  CBC  WBC 4.0 - 10.5 K/uL 5.2   5.1   5.0    Hemoglobin 12.0 - 15.0 g/dL 13.2   11.7   11.7    Hematocrit 36.0 - 46.0 % 39.1   37.1   36.8    Platelets 150.0 - 400.0 K/uL 192.0   198   183     Lab Results  Component Value Date   MCV 87.0 06/13/2021   MCV 92.8 10/12/2020   MCV 92.9 10/11/2020   Lab Results  Component Value Date  TSH 2.09 06/13/2021   Lab Results  Component Value Date   HGBA1C 6.1 06/13/2021     BNP    Component Value Date/Time   BNP 703.5 (H) 02/25/2019 1122    ProBNP No results found for: PROBNP   Lipid Panel     Component Value Date/Time   CHOL 148 06/13/2021 1459   TRIG 244.0 (H) 06/13/2021 1459   HDL 57.60 06/13/2021 1459   CHOLHDL 3 06/13/2021 1459   VLDL 48.8 (H) 06/13/2021 1459   LDLCALC 50 10/08/2020 0313   LDLDIRECT 54.0 06/13/2021 1459     RADIOLOGY: No results found.   Additional studies/ records that were reviewed today include:    Sleep Study IMPRESSIONS: 10/07/2018 - Severe obstructive sleep apnea occurred during the diagnostic portion of the study (AHI 36.9/h; RDI 45.1/h); events were worse with supine position (AHI 46.8/h)  and during REM sleep (AHI 52.2/h).  CPAP was initiated at 5 cm and was titrated to optimal PAP pressure of 11 cm of water. - No significant central sleep apnea occurred during the diagnostic portion of the study (CAI = 0.0/hour). - Severe oxygen desaturation during the diagnostic portion of the study to a nadir of 77%. - The patient snored with loud  snoring volume during the diagnostic portion of the study. - No cardiac abnormalities were noted during this study. - Clinically significant periodic limb movements did not occur during sleep.   DIAGNOSIS - Obstructive Sleep Apnea (327.23 [G47.33 ICD-10])   RECOMMENDATIONS - Trial of CPAP therapy with EPR at 11 cm H2O with heated humidification. A Medium size Resmed Full Face Mask AirFit 20 for Her mask was used for the titration.  - Effort should be made to optimize nasal and oropharyngeal patency. - Avoid alcohol, sedatives and other CNS depressants that may worsen sleep apnea and disrupt normal sleep architecture. - Sleep hygiene should be reviewed to assess factors that may improve sleep quality. - Weight management and regular exercise should be initiated or continued. - Recommend a download in 30 days and sleep clinic evaluation after 4 weeks of therapy.   [Electronically signed] 10/18/2018 04:25 PM   Cath: 02/26/19   Prox RCA to Mid RCA lesion is 15% stenosed. Ost LAD to Prox LAD lesion is 20% stenosed. Prox Cx lesion is 20% stenosed.   Mild non-obstructive coronary artery disease with a patent proximal LAD stent with mild 20% intimal hyperplasia (inserted  09/1999); 20% proximal left circumflex narrowing, and mild irregularity of 10 to 15% in the mid RCA.   Hyperdynamic LV function with an "Ace of Spade "configuration and near cavity obliteration in the mid to apical segment during systole.  There is evidence for left ventricular hypertrophy.  LVEDP is 16 mm.   RECOMMENDATION: Medical therapy with optimal blood pressure control, lipid management, and resumption of Eliquis tomorrow    ASSESSMENT:    1. Paroxysmal atrial fibrillation (HCC)   2. Anticoagulated   3. Essential hypertension   4. Hyperlipidemia LDL goal <70   5. OSA on CPAP   6. Stage 3b chronic kidney disease ([place])   7. Apical variant hypertrophic cardiomyopathy ([place])   8. Ankle edema     PLAN:  1.  CAD:  Cardiac catheterization from February 26, 2020 demonstrated patent LAD stent with mild nonobstructive CAD involving her circumflex and RCA.  Presently she is without any recurrent chest pain and denies any exertional dyspnea.    2.  Exertional dyspnea: She has a history of exertional dyspnea and has  documented LVH.  She has been demonstrated to have a  "spade-like ventricle" with near cavity obliteration in the mid to apical segment of her myocardium during systole.  There is evidence for diastolic dysfunction.  Echo revealed hyperdynamic LV function with EF at 70 to 00%, grade 2 diastolic dysfunction, and apical hypertrophy with obliteration of her LV cavity in systole at apex given the appearance of her "spade-like ventricle. "  She most likely has apical hypertrophic cardiomyopathy.  3. Obstructive sleep apnea: Her initial sleep study from December 2019 showed severe sleep apnea with AHI 36.9, R DI 45.1, rim AHI 52.2 in supine sleep AHI 46.8.  Her most recent download shows average use at 6 hours 52 minutes.  At 10 cm water pressure AHI is 2.3.  Recent compliance has been reduced as far as usage days due to sinus issues for which she has been evaluated by Dr. Herbert Deaner.  I again discussed optimal sleep duration at 7 and 9 hours and potential increased risk for recurrent atrial fibrillation with untreated sleep apnea.  An Epworth Sleepiness Scale score was recalculated in the office today and this endorsed at 4 arguing against residual daytime sleepiness.  4.  PAF: Her last episode of recurrent atrial fibrillation was in December 2021.  She continues to be on Tikosyn and is maintaining sinus rhythm.  Her ECG today shows sinus bradycardia at 47 bpm for which she is asymptomatic.  QTc was normal at 396 ms.  I again discussed with her potential increased risk of recurrent atrial fibrillation if her severe sleep apnea is untreated and she is not using her CPAP device.    5.  CKD: Creatinine was 1.5 in October 2022.   Potassium was 4.1.  At her last office visit I had reduced her spironolactone dose to 12.5 mg daily to.  She now has ankle swelling.  I have suggested slight adjustment and she will alternate 12.5 with 25 mg every other day.  6.  Eliquis anticoagulation: Well-tolerated without bleeding.  With her age and creatinine 1.5, her dose had been reduced to 2.5 twice daily.  7.  Essential hypertension: Her blood pressure on repeat by me was 142/64 but when she had taken it prior to coming to the office was 155/80.  She continues to be on amlodipine 5 mg, candesartan 16 mg, and has been on spironolactone at the reduced dose of 12.5 twice daily.  As noted above, I will slightly increase this to 12.5 mg twice daily alternating with 12.5 mg daily.  7.  Hyperlipidemia: She continues to be on rosuvastatin 40 mg.  LDL cholesterol in December 2021 was 50.  She will be following up with Dr. Alysia Penna several months for her annual wellness visit and laboratory will be obtained.  I will see her in 6 months for reevaluation.  Medication Adjustments/Labs and Tests Ordered: Current medicines are reviewed at length with the patient today.  Concerns regarding medicines are outlined above.  Medication changes, Labs and Tests ordered today are listed in the Patient Instructions below. Patient Instructions  Medication Instructions:  Increase Spironolactone 25 mg  Take 1/2 tablet twice a day on even days next day take 1/2 tablet once a day on odd day Continue all other medication *If you need a refill on your cardiac medications before your next appointment, please call your pharmacy*   Lab Work: [place] in 2 weeks 5/25   Testing/Procedures: None ordered   Follow-Up: At [facility], you and your health needs are  our priority.  As part of our continuing mission to provide you with exceptional heart care, we have created designated Provider Care Teams.  These Care Teams include your primary Cardiologist (physician)  and Advanced Practice Providers (APPs -  Physician Assistants and Nurse Practitioners) who all work together to provide you with the care you need, when you need it.  We recommend signing up for the patient portal called "MyChart".  Sign up information is provided on this After Visit Summary.  MyChart is used to connect with patients for Virtual Visits (Telemedicine).  Patients are able to view lab/test results, encounter notes, upcoming appointments, etc.  Non-urgent messages can be sent to your provider as well.   To learn more about what you can do with MyChart, go to [email or web address].    Your next appointment:  6 months    The format for your next appointment: Office     Provider:  [facility]   Important Information About Sugar         Signed, Shelva Majestic, MD  03/23/2022 10:14 AM    [facility] [address] [place], [place], [place]  [address] Phone: [phone]

## 2022-03-16 NOTE — Progress Notes (Signed)
?  Subjective:  ?Patient ID: Barbara [name], female    DOB: [birth year],  MRN: [id number] ? ?Barbara [name] presents to clinic today for painful thick toenails that are difficult to trim. Pain interferes with ambulation. Aggravating factors include wearing enclosed shoe gear. Pain is relieved with periodic professional debridement. ? ?New problem(s): None.  ? ?PCP is Laurey Morale, MD , and last visit was January 08, 2022. ? ?Allergies  ?Allergen Reactions  ? Lidocaine Anaphylaxis  ? Morphine Other (See Comments)  ?  "Body shuts down"  ? Procaine Hcl Anaphylaxis, Rash and Other (See Comments)  ?  "Anything with 'caine' in it "  ? Sulfonamide Derivatives Hives  ? Norco [Hydrocodone-Acetaminophen] Other (See Comments)  ?  "Made me sick"  ? Tramadol Nausea Only  ? Vytorin [Ezetimibe-Simvastatin] Other (See Comments)  ?  Unknown  ? Tape Itching and Other (See Comments)  ?  Patient prefers either paper tape or Coban wrap  ? ? ?Review of Systems: Negative except as noted in the HPI. ? ?Objective: No changes noted in today's physical examination. ?General: Patient is a pleasant 81 y.o. African American female WD, WN in NAD. AAO x 3.  ? ?Neurovascular Examination: ?CFT immediate b/l LE. Palpable DP/PT pulses b/l LE. Digital hair sparse b/l. Skin temperature gradient WNL b/l. No pain with calf compression b/l. Trace edema bilateral ankles.. No cyanosis or clubbing noted b/l LE. ? ?Protective sensation intact 5/5 intact bilaterally with 10g monofilament b/l. ? ?Dermatological:  ?Pedal skin warm and supple b/l.  No open wounds b/l. No interdigital macerations. Toenails 1-5 b/l elongated, thickened, discolored with subungual debris. +Tenderness with dorsal palpation of nailplates. No hyperkeratotic nor porokeratotic lesions noted b/l. ? ?Musculoskeletal:  ?Muscle strength 5/5 to all lower extremity muscle groups bilaterally. No pain, crepitus or joint limitation noted with ROM bilateral LE. No gross bony deformities  bilaterally. Utilizes cane for ambulation assistance. ? ?  Latest Ref Rng & Units 06/13/2021  ?  2:59 PM  ?Hemoglobin A1C  ?Hemoglobin-A1c 4.6 - 6.5 % 6.1    ? ?Assessment/Plan: ?1. Pain due to onychomycosis of toenails of both feet   ?  ?-Examined patient. ?-Patient to continue soft, supportive shoe gear daily. ?-Toenails 1-5 b/l were debrided in length and girth with sterile nail nippers and dremel without iatrogenic bleeding.  ?-Patient/POA to call should there be question/concern in the interim.  ? ?Return in about 3 months (around 06/06/2022). ? ?Marzetta Board, DPM  ?

## 2022-03-18 ENCOUNTER — Telehealth: Payer: Self-pay | Admitting: Cardiovascular Disease

## 2022-03-18 DIAGNOSIS — H40023 Open angle with borderline findings, high risk, bilateral: Secondary | ICD-10-CM | POA: Diagnosis not present

## 2022-03-18 DIAGNOSIS — H26492 Other secondary cataract, left eye: Secondary | ICD-10-CM | POA: Diagnosis not present

## 2022-03-18 DIAGNOSIS — H04123 Dry eye syndrome of bilateral lacrimal glands: Secondary | ICD-10-CM | POA: Diagnosis not present

## 2022-03-18 DIAGNOSIS — H2511 Age-related nuclear cataract, right eye: Secondary | ICD-10-CM | POA: Diagnosis not present

## 2022-03-18 MED ORDER — CANDESARTAN CILEXETIL 16 MG PO TABS
16.0000 mg | ORAL_TABLET | Freq: Every day | ORAL | 1 refills | Status: DC
Start: 2022-03-18 — End: 2022-05-10

## 2022-03-18 MED ORDER — DOFETILIDE 125 MCG PO CAPS: 125.0000 ug | ORAL_CAPSULE | Freq: Two times a day (BID) | ORAL | 1 refills | Status: DC

## 2022-03-18 NOTE — Telephone Encounter (Signed)
Refills sent to [facility]. ?

## 2022-03-18 NOTE — Telephone Encounter (Signed)
?*  STAT* If patient is at the pharmacy, call can be transferred to refill team. ? ? ?1. Which medications need to be refilled? (please list name of each medication and dose if known)  ? candesartan (ATACAND) 16 MG tablet  ? ? dofetilide (TIKOSYN) 125 MCG capsule  ? ? ?2. Which pharmacy/location (including street and city if local pharmacy) is medication to be sent to?  ?[facility] ([place], [place] ?3. Do they need a 30 day or 90 day supply?  ?90 day ? ?

## 2022-03-23 ENCOUNTER — Encounter: Payer: Self-pay | Admitting: Cardiovascular Disease

## 2022-03-28 DIAGNOSIS — I4819 Other persistent atrial fibrillation: Secondary | ICD-10-CM | POA: Diagnosis not present

## 2022-03-28 DIAGNOSIS — I10 Essential (primary) hypertension: Secondary | ICD-10-CM | POA: Diagnosis not present

## 2022-03-29 LAB — COMPREHENSIVE METABOLIC PANEL
ALT: 18 IU/L (ref 0–32)
AST: 19 IU/L (ref 0–40)
Albumin/Globulin Ratio: 1.8 (ref 1.2–2.2)
Albumin: 4.5 g/dL (ref 3.6–4.6)
Alkaline Phosphatase: 102 IU/L (ref 44–121)
BUN/Creatinine Ratio: 19 (ref 12–28)
BUN: 31 mg/dL — ABNORMAL HIGH (ref 8–27)
Bilirubin Total: 0.4 mg/dL (ref 0.0–1.2)
CO2: 24 mmol/L (ref 20–29)
Calcium: 10.3 mg/dL (ref 8.7–10.3)
Chloride: 103 mmol/L (ref 96–106)
Creatinine, Ser: 1.64 mg/dL — ABNORMAL HIGH (ref 0.57–1.00)
Globulin, Total: 2.5 g/dL (ref 1.5–4.5)
Glucose: 92 mg/dL (ref 70–99)
Potassium: 5.2 mmol/L (ref 3.5–5.2)
Sodium: 139 mmol/L (ref 134–144)
Total Protein: 7 g/dL (ref 6.0–8.5)
eGFR: 31 mL/min/{1.73_m2} — ABNORMAL LOW (ref >59)

## 2022-04-02 DIAGNOSIS — G4733 Obstructive sleep apnea (adult) (pediatric): Secondary | ICD-10-CM | POA: Diagnosis not present

## 2022-04-23 ENCOUNTER — Other Ambulatory Visit: Payer: Self-pay | Admitting: Family Medicine

## 2022-04-23 NOTE — Telephone Encounter (Signed)
Last refill- 11/16/21---60 tabs, 1 refill Last OV-01/08/22  No future OV scheduled.  Can this patient receive a refill?

## 2022-05-02 DIAGNOSIS — M65321 Trigger finger, right index finger: Secondary | ICD-10-CM | POA: Diagnosis not present

## 2022-05-02 DIAGNOSIS — M65312 Trigger thumb, left thumb: Secondary | ICD-10-CM | POA: Diagnosis not present

## 2022-05-02 DIAGNOSIS — M65331 Trigger finger, right middle finger: Secondary | ICD-10-CM | POA: Diagnosis not present

## 2022-05-03 ENCOUNTER — Emergency Department (HOSPITAL_COMMUNITY): Payer: Medicare Other

## 2022-05-03 ENCOUNTER — Encounter (HOSPITAL_COMMUNITY): Payer: Self-pay

## 2022-05-03 ENCOUNTER — Other Ambulatory Visit: Payer: Self-pay

## 2022-05-03 ENCOUNTER — Inpatient Hospital Stay (HOSPITAL_COMMUNITY)
Admission: EM | Admit: 2022-05-03 | Discharge: 2022-05-10 | DRG: 243 | Disposition: A | Discharge status: Home Health | Payer: Medicare Other | Attending: Internal Medicine | Admitting: Internal Medicine

## 2022-05-03 ENCOUNTER — Telehealth: Payer: Self-pay | Admitting: Cardiovascular Disease

## 2022-05-03 DIAGNOSIS — M1990 Unspecified osteoarthritis, unspecified site: Secondary | ICD-10-CM | POA: Diagnosis not present

## 2022-05-03 DIAGNOSIS — Z79899 Other long term (current) drug therapy: Secondary | ICD-10-CM | POA: Diagnosis not present

## 2022-05-03 DIAGNOSIS — N1832 Chronic kidney disease, stage 3b: Secondary | ICD-10-CM | POA: Diagnosis present

## 2022-05-03 DIAGNOSIS — Z882 Allergy status to sulfonamides status: Secondary | ICD-10-CM | POA: Diagnosis not present

## 2022-05-03 DIAGNOSIS — Z87891 Personal history of nicotine dependence: Secondary | ICD-10-CM

## 2022-05-03 DIAGNOSIS — I129 Hypertensive chronic kidney disease with stage 1 through stage 4 chronic kidney disease, or unspecified chronic kidney disease: Secondary | ICD-10-CM | POA: Diagnosis present

## 2022-05-03 DIAGNOSIS — I10 Essential (primary) hypertension: Secondary | ICD-10-CM | POA: Diagnosis present

## 2022-05-03 DIAGNOSIS — R110 Nausea: Secondary | ICD-10-CM | POA: Diagnosis not present

## 2022-05-03 DIAGNOSIS — I219 Acute myocardial infarction, unspecified: Secondary | ICD-10-CM

## 2022-05-03 DIAGNOSIS — N1831 Chronic kidney disease, stage 3a: Secondary | ICD-10-CM | POA: Diagnosis not present

## 2022-05-03 DIAGNOSIS — I4891 Unspecified atrial fibrillation: Secondary | ICD-10-CM | POA: Diagnosis not present

## 2022-05-03 DIAGNOSIS — I959 Hypotension, unspecified: Secondary | ICD-10-CM | POA: Diagnosis not present

## 2022-05-03 DIAGNOSIS — Z8249 Family history of ischemic heart disease and other diseases of the circulatory system: Secondary | ICD-10-CM

## 2022-05-03 DIAGNOSIS — R001 Bradycardia, unspecified: Secondary | ICD-10-CM | POA: Diagnosis present

## 2022-05-03 DIAGNOSIS — I495 Sick sinus syndrome: Secondary | ICD-10-CM

## 2022-05-03 DIAGNOSIS — Z7951 Long term (current) use of inhaled steroids: Secondary | ICD-10-CM | POA: Diagnosis not present

## 2022-05-03 DIAGNOSIS — R002 Palpitations: Secondary | ICD-10-CM | POA: Diagnosis not present

## 2022-05-03 DIAGNOSIS — Z96611 Presence of right artificial shoulder joint: Secondary | ICD-10-CM | POA: Diagnosis not present

## 2022-05-03 DIAGNOSIS — I5033 Acute on chronic diastolic (congestive) heart failure: Secondary | ICD-10-CM

## 2022-05-03 DIAGNOSIS — I214 Non-ST elevation (NSTEMI) myocardial infarction: Principal | ICD-10-CM

## 2022-05-03 DIAGNOSIS — R079 Chest pain, unspecified: Secondary | ICD-10-CM | POA: Diagnosis not present

## 2022-05-03 DIAGNOSIS — I200 Unstable angina: Secondary | ICD-10-CM

## 2022-05-03 DIAGNOSIS — G4733 Obstructive sleep apnea (adult) (pediatric): Secondary | ICD-10-CM | POA: Diagnosis present

## 2022-05-03 DIAGNOSIS — I340 Nonrheumatic mitral (valve) insufficiency: Secondary | ICD-10-CM | POA: Diagnosis not present

## 2022-05-03 DIAGNOSIS — N179 Acute kidney failure, unspecified: Secondary | ICD-10-CM | POA: Diagnosis not present

## 2022-05-03 DIAGNOSIS — D72829 Elevated white blood cell count, unspecified: Secondary | ICD-10-CM | POA: Diagnosis not present

## 2022-05-03 DIAGNOSIS — Z885 Allergy status to narcotic agent status: Secondary | ICD-10-CM | POA: Diagnosis not present

## 2022-05-03 DIAGNOSIS — Z7901 Long term (current) use of anticoagulants: Secondary | ICD-10-CM | POA: Diagnosis not present

## 2022-05-03 DIAGNOSIS — Z888 Allergy status to other drugs, medicaments and biological substances status: Secondary | ICD-10-CM | POA: Diagnosis not present

## 2022-05-03 DIAGNOSIS — Z884 Allergy status to anesthetic agent status: Secondary | ICD-10-CM

## 2022-05-03 DIAGNOSIS — J811 Chronic pulmonary edema: Secondary | ICD-10-CM | POA: Diagnosis not present

## 2022-05-03 DIAGNOSIS — E785 Hyperlipidemia, unspecified: Secondary | ICD-10-CM | POA: Diagnosis present

## 2022-05-03 DIAGNOSIS — T82855A Stenosis of coronary artery stent, initial encounter: Secondary | ICD-10-CM | POA: Diagnosis present

## 2022-05-03 DIAGNOSIS — Y713 Surgical instruments, materials and cardiovascular devices (including sutures) associated with adverse incidents: Secondary | ICD-10-CM | POA: Diagnosis present

## 2022-05-03 DIAGNOSIS — I2510 Atherosclerotic heart disease of native coronary artery without angina pectoris: Secondary | ICD-10-CM | POA: Diagnosis not present

## 2022-05-03 DIAGNOSIS — I480 Paroxysmal atrial fibrillation: Secondary | ICD-10-CM | POA: Diagnosis not present

## 2022-05-03 DIAGNOSIS — I252 Old myocardial infarction: Secondary | ICD-10-CM | POA: Diagnosis not present

## 2022-05-03 DIAGNOSIS — K219 Gastro-esophageal reflux disease without esophagitis: Secondary | ICD-10-CM | POA: Diagnosis not present

## 2022-05-03 DIAGNOSIS — Z809 Family history of malignant neoplasm, unspecified: Secondary | ICD-10-CM

## 2022-05-03 DIAGNOSIS — I421 Obstructive hypertrophic cardiomyopathy: Secondary | ICD-10-CM | POA: Diagnosis not present

## 2022-05-03 DIAGNOSIS — I509 Heart failure, unspecified: Secondary | ICD-10-CM | POA: Diagnosis not present

## 2022-05-03 DIAGNOSIS — R7303 Prediabetes: Secondary | ICD-10-CM | POA: Diagnosis present

## 2022-05-03 DIAGNOSIS — N183 Chronic kidney disease, stage 3 unspecified: Secondary | ICD-10-CM | POA: Diagnosis present

## 2022-05-03 DIAGNOSIS — I4819 Other persistent atrial fibrillation: Secondary | ICD-10-CM | POA: Diagnosis present

## 2022-05-03 DIAGNOSIS — I422 Other hypertrophic cardiomyopathy: Secondary | ICD-10-CM | POA: Diagnosis present

## 2022-05-03 DIAGNOSIS — I110 Hypertensive heart disease with heart failure: Secondary | ICD-10-CM | POA: Diagnosis not present

## 2022-05-03 DIAGNOSIS — R0989 Other specified symptoms and signs involving the circulatory and respiratory systems: Secondary | ICD-10-CM | POA: Diagnosis not present

## 2022-05-03 DIAGNOSIS — I5042 Chronic combined systolic (congestive) and diastolic (congestive) heart failure: Secondary | ICD-10-CM | POA: Diagnosis not present

## 2022-05-03 DIAGNOSIS — I517 Cardiomegaly: Secondary | ICD-10-CM | POA: Diagnosis not present

## 2022-05-03 DIAGNOSIS — E781 Pure hyperglyceridemia: Secondary | ICD-10-CM | POA: Diagnosis present

## 2022-05-03 LAB — CBC WITH DIFFERENTIAL/PLATELET
Abs Immature Granulocytes: 0.02 10*3/uL (ref 0.00–0.07)
Basophils Absolute: 0 10*3/uL (ref 0.0–0.1)
Basophils Relative: 0 %
Eosinophils Absolute: 0 10*3/uL (ref 0.0–0.5)
Eosinophils Relative: 0 %
HCT: 38.2 % (ref 36.0–46.0)
Hemoglobin: 12.3 g/dL (ref 12.0–15.0)
Immature Granulocytes: 0 %
Lymphocytes Relative: 11 %
Lymphs Abs: 1 10*3/uL (ref 0.7–4.0)
MCH: 29.2 pg (ref 26.0–34.0)
MCHC: 32.2 g/dL (ref 30.0–36.0)
MCV: 90.7 fL (ref 80.0–100.0)
Monocytes Absolute: 0.5 10*3/uL (ref 0.1–1.0)
Monocytes Relative: 6 %
Neutro Abs: 7.1 10*3/uL (ref 1.7–7.7)
Neutrophils Relative %: 83 %
Platelets: 177 10*3/uL (ref 150–400)
RBC: 4.21 MIL/uL (ref 3.87–5.11)
RDW: 12.5 % (ref 11.5–15.5)
WBC: 8.5 10*3/uL (ref 4.0–10.5)
nRBC: 0 % (ref 0.0–0.2)

## 2022-05-03 LAB — BASIC METABOLIC PANEL
Anion gap: 6 (ref 5–15)
BUN: 28 mg/dL — ABNORMAL HIGH (ref 8–23)
CO2: 23 mmol/L (ref 22–32)
Calcium: 9.7 mg/dL (ref 8.9–10.3)
Chloride: 109 mmol/L (ref 98–111)
Creatinine, Ser: 1.33 mg/dL — ABNORMAL HIGH (ref 0.44–1.00)
GFR, Estimated: 40 mL/min — ABNORMAL LOW (ref >60)
Glucose, Bld: 151 mg/dL — ABNORMAL HIGH (ref 70–99)
Potassium: 4.6 mmol/L (ref 3.5–5.1)
Sodium: 138 mmol/L (ref 135–145)

## 2022-05-03 LAB — TROPONIN I (HIGH SENSITIVITY)
Troponin I (High Sensitivity): 569 ng/L (ref <18)
Troponin I (High Sensitivity): 801 ng/L (ref <18)
Troponin I (High Sensitivity): 1052 ng/L (ref <18)
Troponin I (High Sensitivity): 1115 ng/L (ref <18)

## 2022-05-03 LAB — HEPARIN LEVEL (UNFRACTIONATED): Heparin Unfractionated: >1.1 IU/mL — ABNORMAL HIGH (ref 0.30–0.70)

## 2022-05-03 LAB — MAGNESIUM: Magnesium: 2.2 mg/dL (ref 1.7–2.4)

## 2022-05-03 LAB — MRSA NEXT GEN BY PCR, NASAL: MRSA by PCR Next Gen: NOT DETECTED

## 2022-05-03 MED ORDER — PANTOPRAZOLE SODIUM 40 MG PO TBEC
40.0000 mg | DELAYED_RELEASE_TABLET | Freq: Every day | ORAL | Status: DC
  Administered 2022-05-04 – 2022-05-10 (×7): 40 mg via ORAL
  Filled 2022-05-03 (×7): qty 1

## 2022-05-03 MED ORDER — METOPROLOL SUCCINATE ER 50 MG PO TB24: 50.0000 mg | ORAL_TABLET | Freq: Every day | ORAL | Status: DC

## 2022-05-03 MED ORDER — ACETAMINOPHEN 325 MG PO TABS
650.0000 mg | ORAL_TABLET | ORAL | Status: DC | PRN
  Administered 2022-05-05 – 2022-05-09 (×4): 650 mg via ORAL
  Filled 2022-05-03 (×4): qty 2

## 2022-05-03 MED ORDER — POLYETHYLENE GLYCOL 3350 17 G PO PACK: 17.0000 g | PACK | Freq: Every day | ORAL | Status: DC | PRN

## 2022-05-03 MED ORDER — ROSUVASTATIN CALCIUM 20 MG PO TABS
40.0000 mg | ORAL_TABLET | Freq: Every day | ORAL | Status: DC
  Administered 2022-05-03 – 2022-05-09 (×7): 40 mg via ORAL
  Filled 2022-05-03 (×7): qty 2

## 2022-05-03 MED ORDER — DOFETILIDE 125 MCG PO CAPS
125.0000 ug | ORAL_CAPSULE | Freq: Two times a day (BID) | ORAL | Status: DC
Start: 2022-05-03 — End: 2022-05-03

## 2022-05-03 MED ORDER — NITROGLYCERIN 0.4 MG SL SUBL
0.4000 mg | SUBLINGUAL_TABLET | SUBLINGUAL | Status: DC | PRN
Start: 2022-05-03 — End: 2022-05-10

## 2022-05-03 MED ORDER — GLUCAGON HCL RDNA (DIAGNOSTIC) 1 MG IJ SOLR: 1.0000 mg | Freq: Once | INTRAMUSCULAR | Status: AC

## 2022-05-03 MED ORDER — ATROPINE SULFATE 1 MG/10ML IJ SOSY
PREFILLED_SYRINGE | INTRAMUSCULAR | Status: AC
  Administered 2022-05-03: 1 mg
  Filled 2022-05-03: qty 10

## 2022-05-03 MED ORDER — DOCUSATE SODIUM 100 MG PO CAPS
100.0000 mg | ORAL_CAPSULE | Freq: Two times a day (BID) | ORAL | Status: DC | PRN
  Administered 2022-05-04: 100 mg via ORAL
  Filled 2022-05-03 (×2): qty 1

## 2022-05-03 MED ORDER — ASPIRIN 81 MG PO CHEW
324.0000 mg | CHEWABLE_TABLET | Freq: Once | ORAL | Status: AC
  Administered 2022-05-03: 324 mg via ORAL
  Filled 2022-05-03: qty 4

## 2022-05-03 MED ORDER — ATROPINE SULFATE 1 MG/ML IV SOLN
1.0000 mg | Freq: Once | INTRAVENOUS | Status: DC
  Filled 2022-05-03: qty 1

## 2022-05-03 MED ORDER — SPIRONOLACTONE 12.5 MG HALF TABLET
12.5000 mg | ORAL_TABLET | ORAL | Status: DC
  Administered 2022-05-04: 12.5 mg via ORAL
  Filled 2022-05-03: qty 1

## 2022-05-03 MED ORDER — IRBESARTAN 150 MG PO TABS
150.0000 mg | ORAL_TABLET | Freq: Every day | ORAL | Status: DC
  Filled 2022-05-03: qty 1

## 2022-05-03 MED ORDER — ONDANSETRON HCL 4 MG/2ML IJ SOLN
4.0000 mg | Freq: Four times a day (QID) | INTRAMUSCULAR | Status: DC | PRN
Start: 2022-05-03 — End: 2022-05-03

## 2022-05-03 MED ORDER — ISOPROTERENOL HCL 0.2 MG/ML IJ SOLN
2.0000 ug/min | INTRAVENOUS | Status: DC
  Administered 2022-05-03: 2 ug/min via INTRAVENOUS
  Administered 2022-05-04: 1.5 ug/min via INTRAVENOUS
  Filled 2022-05-03 (×2): qty 5

## 2022-05-03 MED ORDER — SPIRONOLACTONE 25 MG PO TABS: 25.0000 mg | ORAL_TABLET | ORAL | Status: DC

## 2022-05-03 MED ORDER — AMLODIPINE BESYLATE 5 MG PO TABS: 5.0000 mg | ORAL_TABLET | Freq: Every morning | ORAL | Status: DC

## 2022-05-03 MED ORDER — HEPARIN (PORCINE) 25000 UT/250ML-% IV SOLN
700.0000 [IU]/h | INTRAVENOUS | Status: DC
  Administered 2022-05-03: 950 [IU]/h via INTRAVENOUS
  Administered 2022-05-06: 700 [IU]/h via INTRAVENOUS
  Filled 2022-05-03 (×3): qty 250

## 2022-05-03 MED ORDER — GLUCAGON HCL RDNA (DIAGNOSTIC) 1 MG IJ SOLR
INTRAMUSCULAR | Status: AC
  Administered 2022-05-03: 1 mg
  Filled 2022-05-03: qty 1

## 2022-05-03 MED ORDER — METOPROLOL SUCCINATE ER 50 MG PO TB24
50.0000 mg | ORAL_TABLET | Freq: Once | ORAL | Status: AC
  Administered 2022-05-03: 50 mg via ORAL
  Filled 2022-05-03: qty 1

## 2022-05-03 MED ORDER — CHLORHEXIDINE GLUCONATE CLOTH 2 % EX PADS
6.0000 | MEDICATED_PAD | Freq: Every day | CUTANEOUS | Status: DC
Start: 2022-05-04 — End: 2022-05-10
  Administered 2022-05-04 – 2022-05-09 (×6): 6 via TOPICAL

## 2022-05-03 NOTE — H&P (Signed)
History and Physical    Barbara [name] WLN:[id number] DOB: 05/22/1941 DOA: 05/03/2022  PCP: Barbara Morale, MD  Patient coming from: Home  I have personally briefly reviewed patient's old medical records in [place]  Chief Complaint: Chest pain  HPI: Barbara [name] is a 81 y.o. female with medical history significant of hypertension, hyperlipidemia, coronary artery disease with remote history of PCI/stent of LAD in 2000, paroxysmal A-fib on Eliquis, and OSA on CPAP presented with complaint of chest pain and shortness of breath which started last night.  Patient reports that pain started suddenly last night and she took 4 nitroglycerin tablets to help with the pain.  Reports some improvement in chest pain but she continues to have shortness of breath and palpitations.  ED Course: Upon arrival to ED: Patient temperature 99.2, heart rate 112, RR: 18, blood pressure 163/103, oxygen saturation 97%.  First troponin 569 trended up to 801.  Creatinine: 1.33, GFR: 40.  Magnesium: WNL cardiology consulted who recommended trend troponin her elevated troponin is likely secondary to A-fib no intervention necessary at this time.  [facility] hospitalist consulted for admission for further evaluation and management.  Review of Systems: As per HPI otherwise negative.    Past Medical History:  Diagnosis Date   A-fib [facility])    Allergy    CAD (coronary artery disease)    sees Dr. Shelva Majestic  cardiac stents - 2000   CHF (congestive heart failure) ([place])    Colon polyps    Complication of anesthesia    rash/hives with "caines"   Dyspnea    02/12/18 " when my heart gets out of rhythm, it has not been out of rhythym- since I have been on Tikosyn (11/2017)   Dysrhythmia    afib fib   GERD (gastroesophageal reflux disease)    takes OTC- Omeprazole- prn   Heart murmur    History of stress test    show normal perfusion without scar or ischemia, post EF 68%   Hx of echocardiogram    show  an EF 55%-60% range with grade 1 diastolic dysfunction, she had mitral anular calcification with mild MR, moderate LA dilation and mild pulmonary hypertension with a PA estimated pressure of 31m   Hyperlipidemia    Hypertension    NSTEMI (non-ST elevated myocardial infarction) ([place]    Osteoarthritis    Pneumonia    hx of 2015    PONV (postoperative nausea and vomiting)     Past Surgical History:  Procedure Laterality Date   CARDIAC CATHETERIZATION     11/2017   cardiac stents  2000   COLONOSCOPY  01-05-14   per Dr. JTeena Irani clear, no repeats needed    COLONOSCOPY     CORONARY STENT PLACEMENT  2000   in LAD   DIRECT LARYNGOSCOPY WITH RADIAESSE INJECTION N/A 02/13/2018   Procedure: DIRECT LARYNGOSCOPY WITH RADIAESSE INJECTION;  Surgeon: BMelida Quitter MD;  [facility] [facility]  Service: ENT;  Laterality: N/A;   EYE SURGERY Left    CATARACT REMOVAL   KNEE ARTHROSCOPY Left 01/06/2015   Procedure: LEFT KNEE ARTHROSCOPY, abrasion chondroplasty of the medial femerol condryl,medial and lateral menisectomy, microfracture , synovectomy of the suprpatellar pouch;  Surgeon: RLatanya Maudlin MD;  Location: [facility];  Service: Orthopedics;  Laterality: Left;   LEFT HEART CATH AND CORONARY ANGIOGRAPHY N/A 02/26/2019   Procedure: LEFT HEART CATH AND CORONARY ANGIOGRAPHY;  Surgeon: KTroy Sine MD;  [facility];  Service: Cardiovascular;  Laterality: N/A;   MICROLARYNGOSCOPY W/VOCAL CORD INJECTION N/A 08/07/2018   Procedure: MICROLARYNGOSCOPY WITH VOCAL CORD INJECTION OF PROLARYN;  Surgeon: Melida Quitter, MD;  Location: [facility];  Service: ENT;  Laterality: N/A;  JET VENTILATION   REVERSE SHOULDER ARTHROPLASTY Right 03/23/2020   Procedure: REVERSE SHOULDER ARTHROPLASTY;  Surgeon: Nicholes Stairs, MD;  Location: [facility];  Service: Orthopedics;  Laterality: Right;   VAGINAL HYSTERECTOMY  1971     reports that she quit smoking about 25 years ago. Her smoking use included cigarettes. She has  never used smokeless tobacco. She reports that she does not drink alcohol and does not use drugs.  Allergies  Allergen Reactions   Lidocaine Anaphylaxis   Morphine Other (See Comments)    "Body shuts down"   Procaine Hcl Anaphylaxis, Rash and Other (See Comments)    "Anything with 'caine' in it "   Sulfonamide Derivatives Hives   Norco [Hydrocodone-Acetaminophen] Other (See Comments)    "Made me sick"   Tramadol Nausea Only   Vytorin [Ezetimibe-Simvastatin] Other (See Comments)    Unknown   Tape Itching and Other (See Comments)    Patient prefers either paper tape or Coban wrap    Family History  Problem Relation Age of Onset   Cancer Maternal Grandmother    Heart disease Maternal Grandfather     Prior to Admission medications   Medication Sig Start Date End Date Taking? Authorizing Provider  acetaminophen (TYLENOL) 500 MG tablet Take 500 mg by mouth daily as needed for headache.   Yes [provider]  amLODipine (NORVASC) 5 MG tablet Take 1 tablet (5 mg total) by mouth daily. Patient taking differently: Take 5 mg by mouth every morning. 08/08/21  Yes Troy Sine, MD  amoxicillin (AMOXIL) 500 MG tablet Take 2,000 mg by mouth See admin instructions. Take 4 tablets (2000 mg) by mouth one hour prior to dental appointments 04/24/22  Yes [provider]  apixaban (ELIQUIS) 2.5 MG TABS tablet Take 1 tablet (2.5 mg total) by mouth 2 (two) times daily. 02/11/22  Yes Minus Breeding, MD  candesartan (ATACAND) 16 MG tablet Take 1 tablet (16 mg total) by mouth daily. Patient taking differently: Take 16 mg by mouth every morning. 03/18/22  Yes Troy Sine, MD  Cholecalciferol (VITAMIN D3) 2000 units TABS Take 2,000 Units by mouth daily.   Yes [provider]  dextromethorphan (DELSYM) 30 MG/5ML liquid Take 15 mg by mouth daily as needed for cough.   Yes [provider]  docusate sodium (COLACE) 100 MG capsule Take 1 capsule (100 mg total) by mouth 2  (two) times daily. Patient taking differently: Take 100 mg by mouth 2 (two) times daily as needed (constipation). 08/01/21  Yes Barbara Morale, MD  dofetilide (TIKOSYN) 125 MCG capsule Take 1 capsule (125 mcg total) by mouth 2 (two) times daily. 03/18/22  Yes Troy Sine, MD  ipratropium (ATROVENT) 0.06 % nasal spray Place 2 sprays into both nostrils 2 (two) times daily. 04/28/18  Yes [provider]  ketoconazole (NIZORAL) 2 % cream Apply 1 application topically 2 (two) times daily as needed for irritation. 06/13/21  Yes Barbara Morale, MD  ketotifen (ZADITOR) 0.025 % ophthalmic solution Place 1 drop into the right eye every morning.   Yes [provider]  Liniments (BLUE-EMU SUPER STRENGTH) CREA Apply 1 Application topically daily.   Yes [provider]  loratadine (CLARITIN) 10 MG tablet Take 10 mg by mouth daily as needed (allergies/sinus  issues).    Yes [provider]  LORazepam (ATIVAN) 0.5 MG tablet TAKE 1 TABLET BY MOUTH EVERY 6 HOURS AS NEEDED FOR ANXIETY Patient taking differently: Take 0.5 mg by mouth at bedtime. 04/23/22  Yes Barbara Morale, MD  Multiple Vitamin (MULTIVITAMIN WITH MINERALS) TABS tablet Take 1 tablet by mouth daily.   Yes [provider]  nitroGLYCERIN (NITROSTAT) 0.4 MG SL tablet DISSOLVE ONE TABLET UNDER THE TONGUE EVERY 5 MINUTES AS NEEDED FOR CHEST PAIN.  DO NOT EXCEED A TOTAL OF 3 DOSES IN 15 MINUTES Patient taking differently: Place 0.4 mg under the tongue every 5 (five) minutes as needed for chest pain. 06/21/21  Yes Troy Sine, MD  pantoprazole (PROTONIX) 40 MG tablet Take 1 tablet (40 mg total) by mouth daily. Patient taking differently: Take 40 mg by mouth every morning. 01/03/22  Yes Troy Sine, MD  PRESCRIPTION MEDICATION See admin instructions. CPAP- At bedtime   Yes [provider]  Probiotic Product (PROBIOTIC GUMMIES PO) Take 2 tablets by mouth every morning.   Yes [provider]   rosuvastatin (CRESTOR) 40 MG tablet Take 1 tablet (40 mg total) by mouth daily. Patient taking differently: Take 40 mg by mouth at bedtime. 01/03/22  Yes Troy Sine, MD  spironolactone (ALDACTONE) 25 MG tablet Take 1/2 tablet daily on odd days next day on even days take 1/2 tablet twice a day Patient taking differently: Take 12.5 mg by mouth See admin instructions. Take 1/2 tablet (12.5 mg) by mouth in the morning on odd -numbered days;  take 1/2 tablet (12.5 mg) twice a day on even-numbered days 03/13/22  Yes Troy Sine, MD  triamcinolone (NASACORT) 55 MCG/ACT nasal inhaler Place 1 spray into both nostrils daily as needed (for allergies).  10/17/12  Yes Barbara Morale, MD    Physical Exam: Vitals:   05/03/22 1415 05/03/22 1430 05/03/22 1500 05/03/22 1530  BP: 121/86 123/73 (!) 140/98 109/83  Pulse: (!) 43 87 (!) 121 96  Resp: 18 17 (!) 22 15  Temp:      TempSrc:      SpO2: 99% 100% 97% 98%    Constitutional: NAD, calm, comfortable, on room air, communicating well Eyes: PERRL, lids and conjunctivae normal ENMT: Mucous membranes are moist. Posterior pharynx clear of any exudate or lesions.Normal dentition.  Neck: normal, supple, no masses, no thyromegaly Respiratory: clear to auscultation bilaterally, no wheezing, no crackles. Normal respiratory effort. No accessory muscle use.  Cardiovascular: Irregular rhythm no murmurs / rubs / gallops.  Bilateral trace pitting edema positive. 2+ pedal pulses. No carotid bruits.  Abdomen: no tenderness, no masses palpated. No hepatosplenomegaly. Bowel sounds positive.  Musculoskeletal: no clubbing / cyanosis. No joint deformity upper and lower extremities. Good ROM, no contractures. Normal muscle tone.  Skin: no rashes, lesions, ulcers. No induration Neurologic: CN 2-12 grossly intact. Sensation intact, DTR normal. Strength 5/5 in all 4.  Psychiatric: Normal judgment and insight. Alert and oriented x 3. Normal mood.    Labs on Admission: I  have personally reviewed following labs and imaging studies  CBC: Recent Labs  Lab 05/03/22 1259  WBC 8.5  NEUTROABS 7.1  HGB 12.3  HCT 38.2  MCV 90.7  PLT 809   Basic Metabolic Panel: Recent Labs  Lab 05/03/22 1206  NA 138  K 4.6  CL 109  CO2 23  GLUCOSE 151*  BUN 28*  CREATININE 1.33*  CALCIUM 9.7  MG 2.2   GFR: CrCl cannot  be calculated (Unknown ideal weight.). Liver Function Tests: No results for input(s): "AST", "ALT", "ALKPHOS", "BILITOT", "PROT", "ALBUMIN" in the last 168 hours. No results for input(s): "LIPASE", "AMYLASE" in the last 168 hours. No results for input(s): "AMMONIA" in the last 168 hours. Coagulation Profile: No results for input(s): "INR", "PROTIME" in the last 168 hours. Cardiac Enzymes: No results for input(s): "CKTOTAL", "CKMB", "CKMBINDEX", "TROPONINI" in the last 168 hours. BNP (last 3 results) No results for input(s): "PROBNP" in the last 8760 hours. HbA1C: No results for input(s): "HGBA1C" in the last 72 hours. CBG: No results for input(s): "GLUCAP" in the last 168 hours. Lipid Profile: No results for input(s): "CHOL", "HDL", "LDLCALC", "TRIG", "CHOLHDL", "LDLDIRECT" in the last 72 hours. Thyroid Function Tests: No results for input(s): "TSH", "T4TOTAL", "FREET4", "T3FREE", "THYROIDAB" in the last 72 hours. Anemia Panel: No results for input(s): "VITAMINB12", "FOLATE", "FERRITIN", "TIBC", "IRON", "RETICCTPCT" in the last 72 hours. Urine analysis:    Component Value Date/Time   COLORURINE Red (A) 09/13/2021 1203   APPEARANCEUR Turbid (A) 09/13/2021 1203   LABSPEC 1.015 09/13/2021 1203   PHURINE 7.0 09/13/2021 1203   GLUCOSEU NEGATIVE 09/13/2021 1203   HGBUR LARGE (A) 09/13/2021 1203   HGBUR negative 10/17/2010 0859   BILIRUBINUR NEGATIVE 09/13/2021 1203   BILIRUBINUR neg 08/01/2021 1037   KETONESUR NEGATIVE 09/13/2021 1203   PROTEINUR Positive (A) 08/01/2021 1037   UROBILINOGEN 0.2 09/13/2021 1203   NITRITE NEGATIVE  09/13/2021 1203   LEUKOCYTESUR TRACE (A) 09/13/2021 1203    Radiological Exams on Admission: DG Chest 2 View  Result Date: 05/03/2022 CLINICAL DATA:  Chest pain/tightness and palpitations. History of atrial fibrillation. EXAM: CHEST - 2 VIEW COMPARISON:  Chest radiographs 10/07/2020 and earlier. Cardiac MRI 05/19/2020. FINDINGS: The cardiac silhouette is mildly enlarged. Rounded density posteriorly in the left lung base is chronic and is favored to reflect either a Bochdalek's hernia or focal eventration of the left hemidiaphragm and associated atelectasis. The lungs are clear elsewhere. No pleural effusion or pneumothorax is identified. A right shoulder arthroplasty is noted. IMPRESSION: No active cardiopulmonary disease. Electronically Signed   By: Logan Bores M.D.   On: 05/03/2022 12:02    EKG: Independently reviewed.  A-fib, diffuse ST depression, prolonged QT interval  Assessment/Plan    NSTEMI: -Patient presented with chest pain and shortness of breath.  Troponin: 569 -->801--> 1052 -Cardiology consulted - recommended to start beta-blocker and trend troponin and observe overnight.   -Since troponin trended up-started patient on heparin gtt.  Cardiology notified about elevated troponin -Monitor closely on telemetry.  Monitor vitals  Hypertension: Elevated -We will resume home medications  A-fib: -Continue Tikosyn hold Eliquis in the setting of heparin gtt.  Monitor closely on telemetry.  Monitor electrolytes.  CKD stage III: -At baseline.  Continue to monitor  Hyperlipidemia: Continue statin  GERD: Stable on PPI  DVT prophylaxis: Heparin GTT Code Status: Full code Family Communication: None present at bedside.  Plan of care discussed with patient in length and he verbalized understanding and agreed with it. Disposition Plan: To be determined Consults called: Cardiology Admission status: Observation   Mckinley Jewel MD [facility]  If 7PM-7AM, please contact  night-coverage [email or web address]  05/03/2022, 4:04 PM

## 2022-05-03 NOTE — ED Notes (Signed)
[facility] on unit to transport pt to [facility] ED

## 2022-05-03 NOTE — ED Notes (Signed)
Paged floor coverage and cardiology

## 2022-05-03 NOTE — Telephone Encounter (Signed)
Patient called in to triage to report chest pain last night after drinkng cold water. From midnight until around 1:30 am she used 4 NTG. While on phone BP 138/104, P 82. She reports sob, but no chest pain now. Recommended she have someone take her to the ED or call 911. She will have someone take to her [facility].

## 2022-05-03 NOTE — Progress Notes (Addendum)
ANTICOAGULATION CONSULT NOTE - Initial Consult  Pharmacy Consult for Heparin Indication: chest pain/ACS + afib  Allergies  Allergen Reactions   Lidocaine Anaphylaxis   Morphine Other (See Comments)    "Body shuts down"   Procaine Hcl Anaphylaxis, Rash and Other (See Comments)    "Anything with 'caine' in it "   Sulfonamide Derivatives Hives   Norco [Hydrocodone-Acetaminophen] Other (See Comments)    "Made me sick"   Tramadol Nausea Only   Vytorin [Ezetimibe-Simvastatin] Other (See Comments)    Unknown   Tape Itching and Other (See Comments)    Patient prefers either paper tape or Coban wrap    Patient Measurements:   Heparin Dosing Weight: 78kg  Vital Signs: Temp: 99.2 F (37.3 C) (06/30 1114) Temp Source: Oral (06/30 1114) BP: 151/89 (06/30 1600) Pulse Rate: 50 (06/30 1600)  Labs: Recent Labs    05/03/22 1206 05/03/22 1259 05/03/22 1346 05/03/22 1545  HGB  --  12.3  --   --   HCT  --  38.2  --   --   PLT  --  177  --   --   CREATININE 1.33*  --   --   --   TROPONINIHS 569*  --  801* 1,052*    CrCl cannot be calculated (Unknown ideal weight.).   Medical History: Past Medical History:  Diagnosis Date   A-fib [facility])    Allergy    CAD (coronary artery disease)    sees Dr. Shelva Majestic  cardiac stents - 2000   CHF (congestive heart failure) ([place])    Colon polyps    Complication of anesthesia    rash/hives with "caines"   Dyspnea    02/12/18 " when my heart gets out of rhythm, it has not been out of rhythym- since I have been on Tikosyn (11/2017)   Dysrhythmia    afib fib   GERD (gastroesophageal reflux disease)    takes OTC- Omeprazole- prn   Heart murmur    History of stress test    show normal perfusion without scar or ischemia, post EF 68%   Hx of echocardiogram    show an EF 55%-60% range with grade 1 diastolic dysfunction, she had mitral anular calcification with mild MR, moderate LA dilation and mild pulmonary hypertension with a PA estimated  pressure of 94m   Hyperlipidemia    Hypertension    [place] ([place]-ST elevated [place]    Osteoarthritis    Pneumonia    hx of 2015    PONV (postoperative nausea and vomiting)     Assessment: Active Problem(s): SOB, CP>now resolved but has elevated troponins.  PMH: apical HCM , afib, chronic ankle edema,  AC/Heme: Eliquis 2.'5mg'$  PTA (LD 6/30 AM). ACS with elevated troponins. Baseline Hgb 12.3, Plts 177 ok. - - Baseline HL >1.1 (Elevated from Eliquis)  Goal of Therapy:  Heparin level 0.3-0.7 units/ml Monitor platelets by anticoagulation protocol: Yes   Plan:  Start IV heparin with no bolus since had Eliquis this AM with renal insuff. IV heparin 950 units/hr to start at 8:30pm Check 6 hrs aPTT after heparin starts. Daily HL, aPTT, and CBC   Naszir Cott S. RAlford Highland PharmD, BCPS Clinical Staff Pharmacist [email or web address] RAlford Highland Lynnmarie Lovett Stillinger 05/03/2022,5:14 PM

## 2022-05-03 NOTE — Progress Notes (Signed)
eLink Physician-Brief Progress Note Patient Name: Barbara [name] DOB: 1941-04-19 MRN: [id number]   Date of Service  05/03/2022  HPI/Events of Note  81/F with hx of HTN, CAD, paroxysmal Afib on eliquis, OSA on CPAP, presenting with chest pain and shortness of breath. In th ED, pt with rising troponin, pt started on heparin gtt.  Cardiology consulted.   Pt is awake and alert, not in distress.  BP 124/58, HR 60, RR 18, O2 sats 97% on RA.  EKG showing atrial fibrillation. ST depression in the infero-lateral leads. One EKG with slow ventricular response with rate in the 30s.  eICU Interventions  NSTEMI Hypertension Atrial fibrillation in slow ventricular response CKD GERD  Continue heparin gtt, ASA, statin.  Hold beta blocker.  Hold Tikosyn.  Pt on isoproterenol at this time. Heparin gtt. PPI.      Intervention Category Evaluation Type: New Patient Evaluation  Elsie Lincoln 05/03/2022, 9:18 PM

## 2022-05-03 NOTE — ED Triage Notes (Signed)
Pt reports having chest pain and SHOB last night. Pt states she took 4 nitroglycerin tablets last night to help with pain. Pt states that chest pain has relieved since, but endorses continued [facility] and palpitations.

## 2022-05-03 NOTE — Consult Note (Addendum)
NAME:  Barbara [name], MRN:  [id number], DOB:  [birth year], LOS: 0 ADMISSION DATE:  05/03/2022, CONSULTATION DATE:  6/30 REFERRING MD:  Dr. Doristine Bosworth, CHIEF COMPLAINT:  NSTEMI   History of Present Illness:  Patient is a 81 year old female with pertinent PMH of HTN, HLD, CAD with stent of LAD and 2000, paroxysmal A-fib on Eliquis, OSA on CPAP presents to [facility] on 6/30 with chest pain/shortness of breath.  Patient states her chest pain and shortness of breath began 6/29 during the night.  She took 4 nitro tabs which relieved the pain.  SOB and palpitations present.  Patient with HR 110s irregular.  Other vital stable.  EKG showing A-fib with ST depression similar to previous EKGs; prolonged QTc.  CXR WNL.  Mag 2.2 and K 4.6.  Troponin initially 569 then 801.  Cards consulted suspect demand ischemia.  Recommending trending troponin and starting on BB.  Patient started on heparin.  Patient was given Toprol 50 mg.  Shortly after receiving patient's HR 20s to 30s in A-fib; BP stable.  Patient was given 2 doses of atropine and glucagon with improvement in heart rate to 50s.  Cardiology recommended transfer to [facility] ICU and starting patient on  isoproterenol drip.  Considering TVP if refractory bradycardia persist.  Patient transferred to [facility] to [facility] ICU.  PCCM consulted.  Pertinent  Medical History   Past Medical History:  Diagnosis Date   A-fib [facility])    Allergy    CAD (coronary artery disease)    sees Dr. Shelva Majestic  cardiac stents - 2000   CHF (congestive heart failure) ([place])    Colon polyps    Complication of anesthesia    rash/hives with "caines"   Dyspnea    02/12/18 " when my heart gets out of rhythm, it has not been out of rhythym- since I have been on Tikosyn (11/2017)   Dysrhythmia    afib fib   GERD (gastroesophageal reflux disease)    takes OTC- Omeprazole- prn   Heart murmur    History of stress test    show normal perfusion without scar or ischemia, post EF 68%   Hx of  echocardiogram    show an EF 55%-60% range with grade 1 diastolic dysfunction, she had mitral anular calcification with mild MR, moderate LA dilation and mild pulmonary hypertension with a PA estimated pressure of 31m   Hyperlipidemia    Hypertension    [place]    Osteoarthritis    Pneumonia    hx of 2015    PONV (postoperative nausea and vomiting)      Significant Hospital Events: Including procedures, antibiotic start and stop dates in addition to other pertinent events   [facility] on BB; became brady 20-30s; responded to atropine and glucagon; started on isoproterenol and [facility] for possible TVP if bradycardia persists; pccm consulted  Interim History / Subjective:  Patient AO and denies chest pain/sob currently On RA On Isoproterenol drip w/ HR upper 50s to 60s BP stable  Objective   Blood pressure (!) 154/56, pulse 70, temperature 98.2 F (36.8 C), temperature source Oral, resp. rate (!) 22, height '5\' 7"'$  (1.702 m), weight 79.4 kg, SpO2 97 %.       No intake or output data in the 24 hours ending 05/03/22 2119 Filed Weights   05/03/22 1600 05/03/22 1722  Weight: 79.4 kg 79.4 kg    Examination: General:  elderly female in NAD HEENT: MM pink/moist Neuro: Aox3; [name] CV: s1s2, brady irregular 50-60s, no m/r/g PULM:  dim clear BS bilaterally; on room air GI: soft, bsx4 active  Extremities: warm/dry, no edema  Skin: no rashes or lesions appreciated   Resolved Hospital Problem list     Assessment & Plan:  NSTEMI Bradycardia: after receiving toprol 50 mg HR 20-30s; responded to atropine and glucagon; started isoproterenol drip Prolonged qtc HTN/HLD: Home meds amlodipine, candesartan, nitro, rosuvastatin, spironolactone Hx of CAD: PCI/stents placed 2000 A-fib: rate controlled; on tikosyn and eliquis P: -Admit to ICU w/ continuous telemetry monitoring -Cardiology  following; appreciate recs -Cards recommended starting isoproterenol drip for bradycardia while waiting for BB to wear off; consider TVP if bradycardia persist -Continue to trend troponin -Continue heparin gtt -Continue statin -cont home amolodipine, irbesartan, prn nitro, spironolactone tomorrow morning -holding tikosyn in setting of qtc prolongation and bradycardia -avoid qtc prolonging agents  OSA on CPAP P: -CPAP nightly  CKD 3 P: -Trend BMP / urinary output -Replace electrolytes as indicated -Avoid nephrotoxic agents, ensure adequate renal perfusion  GERD P: -PPI  Best Practice (right click and "Reselect all SmartList Selections" daily)   Diet/type: clear liquids DVT prophylaxis: systemic heparin GI prophylaxis: PPI Lines: N/A Foley:  N/A Code Status:  full code Last date of multidisciplinary goals of care discussion [6/30 spoke with patient at bedside and updated on plan of care; states she would like to be full code but does not want to be on life support for more than a few weeks. Dr. Tacy Learn spoke with family over phone and updated.]  Labs   CBC: Recent Labs  Lab 05/03/22 1259  WBC 8.5  NEUTROABS 7.1  HGB 12.3  HCT 38.2  MCV 90.7  PLT 976    Basic Metabolic Panel: Recent Labs  Lab 05/03/22 1206  NA 138  K 4.6  CL 109  CO2 23  GLUCOSE 151*  BUN 28*  CREATININE 1.33*  CALCIUM 9.7  MG 2.2   GFR: Estimated Creatinine Clearance: 34.8 mL/min (A) (by C-G formula based on SCr of 1.33 mg/dL (H)). Recent Labs  Lab 05/03/22 1259  WBC 8.5    Liver Function Tests: No results for input(s): "AST", "ALT", "ALKPHOS", "BILITOT", "PROT", "ALBUMIN" in the last 168 hours. No results for input(s): "LIPASE", "AMYLASE" in the last 168 hours. No results for input(s): "AMMONIA" in the last 168 hours.  ABG    Component Value Date/Time   HCO3 30.3 (H) 02/25/2019 1130   TCO2 32 02/25/2019 1130   O2SAT 73.0 02/25/2019 1130     Coagulation Profile: No results  for input(s): "INR", "PROTIME" in the last 168 hours.  Cardiac Enzymes: No results for input(s): "CKTOTAL", "CKMB", "CKMBINDEX", "TROPONINI" in the last 168 hours.  HbA1C: Hgb A1c MFr Bld  Date/Time Value Ref Range Status  06/13/2021 02:59 PM 6.1 4.6 - 6.5 % Final    Comment:    Glycemic Control Guidelines for People with Diabetes:Non Diabetic:  <6%Goal of Therapy: <7%Additional Action Suggested:  >8%   10/08/2020 03:13 AM 5.7 (H) 4.8 - 5.6 % Final    Comment:    (NOTE) Pre diabetes:          5.7%-6.4%  Diabetes:              >6.4%  Glycemic control for   <7.0% adults with diabetes     CBG: No results for input(s): "GLUCAP" in the last 168 hours.  Review of Systems:   Review  of Systems  Constitutional:  Negative for fever.  Respiratory:  Positive for shortness of breath. Negative for cough and wheezing.   Cardiovascular:  Positive for chest pain and palpitations.  Gastrointestinal:  Negative for diarrhea, nausea and vomiting.     Past Medical History:  She,  has a past medical history of A-fib ([place]), Allergy, CAD (coronary artery disease), CHF (congestive heart failure) ([place]), Colon polyps, Complication of anesthesia, Dyspnea, Dysrhythmia, GERD (gastroesophageal reflux disease), Heart murmur, History of stress test, echocardiogram, Hyperlipidemia, Hypertension, NSTEMI (non-ST elevated myocardial infarction) ([place]), Osteoarthritis, Pneumonia, and PONV (postoperative nausea and vomiting).   Surgical History:   Past Surgical History:  Procedure Laterality Date   CARDIAC CATHETERIZATION     11/2017   cardiac stents  2000   COLONOSCOPY  01-05-14   per Dr. Teena Irani, clear, no repeats needed    COLONOSCOPY     CORONARY STENT PLACEMENT  2000   in LAD   DIRECT LARYNGOSCOPY WITH RADIAESSE INJECTION N/A 02/13/2018   Procedure: DIRECT LARYNGOSCOPY WITH RADIAESSE INJECTION;  Surgeon: Melida Quitter, MD;  Location: [place];  Service: ENT;  Laterality: N/A;   EYE SURGERY Left     CATARACT REMOVAL   KNEE ARTHROSCOPY Left 01/06/2015   Procedure: LEFT KNEE ARTHROSCOPY, abrasion chondroplasty of the medial femerol condryl,medial and lateral menisectomy, microfracture , synovectomy of the suprpatellar pouch;  Surgeon: Latanya Maudlin, MD;  Location: [facility];  Service: Orthopedics;  Laterality: Left;   LEFT HEART CATH AND CORONARY ANGIOGRAPHY N/A 02/26/2019   Procedure: LEFT HEART CATH AND CORONARY ANGIOGRAPHY;  Surgeon: Troy Sine, MD;  Location: [place] [facility];  Service: Cardiovascular;  Laterality: N/A;   MICROLARYNGOSCOPY W/VOCAL CORD INJECTION N/A 08/07/2018   Procedure: MICROLARYNGOSCOPY WITH VOCAL CORD INJECTION OF PROLARYN;  Surgeon: Melida Quitter, MD;  Location: [place];  Service: ENT;  Laterality: N/A;  JET VENTILATION   REVERSE SHOULDER ARTHROPLASTY Right 03/23/2020   Procedure: REVERSE SHOULDER ARTHROPLASTY;  Surgeon: Nicholes Stairs, MD;  Location: [place];  Service: Orthopedics;  Laterality: Right;   VAGINAL HYSTERECTOMY  1971     Social History:   reports that she quit smoking about 25 years ago. Her smoking use included cigarettes. She has never used smokeless tobacco. She reports that she does not drink alcohol and does not use drugs.   Family History:  Her family history includes Cancer in her maternal grandmother; Heart disease in her maternal grandfather.   Allergies Allergies  Allergen Reactions   Lidocaine Anaphylaxis   Morphine Other (See Comments)    "Body shuts down"   Procaine Hcl Anaphylaxis, Rash and Other (See Comments)    "Anything with 'caine' in it "   Sulfonamide Derivatives Hives   Norco [Hydrocodone-Acetaminophen] Other (See Comments)    "Made me sick"   Tramadol Nausea Only   Vytorin [Ezetimibe-Simvastatin] Other (See Comments)    Unknown   Tape Itching and Other (See Comments)    Patient prefers either paper tape or Coban wrap     Home Medications  Prior to Admission medications   Medication Sig Start Date End Date  Taking? Authorizing Provider  acetaminophen (TYLENOL) 500 MG tablet Take 500 mg by mouth daily as needed for headache.   Yes [provider]  amLODipine (NORVASC) 5 MG tablet Take 1 tablet (5 mg total) by mouth daily. Patient taking differently: Take 5 mg by mouth every morning. 08/08/21  Yes Troy Sine, MD  amoxicillin (AMOXIL) 500 MG tablet Take 2,000 mg by  mouth See admin instructions. Take 4 tablets (2000 mg) by mouth one hour prior to dental appointments 04/24/22  Yes [provider]  apixaban (ELIQUIS) 2.5 MG TABS tablet Take 1 tablet (2.5 mg total) by mouth 2 (two) times daily. 02/11/22  Yes Minus Breeding, MD  candesartan (ATACAND) 16 MG tablet Take 1 tablet (16 mg total) by mouth daily. Patient taking differently: Take 16 mg by mouth every morning. 03/18/22  Yes Troy Sine, MD  Cholecalciferol (VITAMIN D3) 2000 units TABS Take 2,000 Units by mouth daily.   Yes [provider]  dextromethorphan (DELSYM) 30 MG/5ML liquid Take 15 mg by mouth daily as needed for cough.   Yes [provider]  docusate sodium (COLACE) 100 MG capsule Take 1 capsule (100 mg total) by mouth 2 (two) times daily. Patient taking differently: Take 100 mg by mouth 2 (two) times daily as needed (constipation). 08/01/21  Yes Laurey Morale, MD  dofetilide (TIKOSYN) 125 MCG capsule Take 1 capsule (125 mcg total) by mouth 2 (two) times daily. 03/18/22  Yes Troy Sine, MD  ipratropium (ATROVENT) 0.06 % nasal spray Place 2 sprays into both nostrils 2 (two) times daily. 04/28/18  Yes [provider]  ketoconazole (NIZORAL) 2 % cream Apply 1 application topically 2 (two) times daily as needed for irritation. 06/13/21  Yes Laurey Morale, MD  ketotifen (ZADITOR) 0.025 % ophthalmic solution Place 1 drop into the right eye every morning.   Yes [provider]  Liniments (BLUE-EMU SUPER STRENGTH) CREA Apply 1 Application topically daily.   Yes [provider]   loratadine (CLARITIN) 10 MG tablet Take 10 mg by mouth daily as needed (allergies/sinus issues).    Yes [provider]  LORazepam (ATIVAN) 0.5 MG tablet TAKE 1 TABLET BY MOUTH EVERY 6 HOURS AS NEEDED FOR ANXIETY Patient taking differently: Take 0.5 mg by mouth at bedtime. 04/23/22  Yes Laurey Morale, MD  Multiple Vitamin (MULTIVITAMIN WITH MINERALS) TABS tablet Take 1 tablet by mouth daily.   Yes [provider]  nitroGLYCERIN (NITROSTAT) 0.4 MG SL tablet DISSOLVE ONE TABLET UNDER THE TONGUE EVERY 5 MINUTES AS NEEDED FOR CHEST PAIN.  DO NOT EXCEED A TOTAL OF 3 DOSES IN 15 MINUTES Patient taking differently: Place 0.4 mg under the tongue every 5 (five) minutes as needed for chest pain. 06/21/21  Yes Troy Sine, MD  pantoprazole (PROTONIX) 40 MG tablet Take 1 tablet (40 mg total) by mouth daily. Patient taking differently: Take 40 mg by mouth every morning. 01/03/22  Yes Troy Sine, MD  PRESCRIPTION MEDICATION See admin instructions. CPAP- At bedtime   Yes [provider]  Probiotic Product (PROBIOTIC GUMMIES PO) Take 2 tablets by mouth every morning.   Yes [provider]  rosuvastatin (CRESTOR) 40 MG tablet Take 1 tablet (40 mg total) by mouth daily. Patient taking differently: Take 40 mg by mouth at bedtime. 01/03/22  Yes Troy Sine, MD  spironolactone (ALDACTONE) 25 MG tablet Take 1/2 tablet daily on odd days next day on even days take 1/2 tablet twice a day Patient taking differently: Take 12.5 mg by mouth See admin instructions. Take 1/2 tablet (12.5 mg) by mouth in the morning on odd -numbered days;  take 1/2 tablet (12.5 mg) twice a day on even-numbered days 03/13/22  Yes Troy Sine, MD  triamcinolone (NASACORT) 55 MCG/ACT nasal inhaler Place 1 spray into both nostrils daily as needed (for allergies).  10/17/12  Yes Sarajane Jews,  Ishmael Holter, MD     Critical care time: 45 minutes    JD Rexene Agent [facility] Pulmonary & Critical Care 05/03/2022, 9:19  PM  Please see [email or web address] for pager details.  From 7A-7P if no response, please call [phone]. After hours, please call ELink [phone].

## 2022-05-03 NOTE — ED Provider Notes (Signed)
[place] DEPT Provider Note   CSN: [id number] Arrival date & time: 05/03/22  1103     History  Chief Complaint  Patient presents with   Chest Pain   Palpitations    Barbara [name] is a 81 y.o. female.  HPI 81 year old female presents with chest pain.  Started last night around midnight.  She had swallowed some water and then started feeling like there were drums beating in her chest and had tightness and heaviness.  Was feeling discomfort going down her left arm and felt hot and nauseated.  After couple hours she started to feel better but did not get much sleep last night.  In total she took 4 nitroglycerin.  She feels like the chest symptoms have improved though she still feels like is hard to get a full breath.  Does not feel the palpitations anymore.  This is similar to what she has felt before with atrial fibrillation.  She has some chronic ankle edema that is unchanged.  She reports compliance with all of her medications as recently as this morning.  Home Medications Prior to Admission medications   Medication Sig Start Date End Date Taking? Authorizing Provider  acetaminophen (TYLENOL) 500 MG tablet Take 500 mg by mouth daily as needed for headache.   Yes [provider]  amLODipine (NORVASC) 5 MG tablet Take 1 tablet (5 mg total) by mouth daily. Patient taking differently: Take 5 mg by mouth every morning. 08/08/21  Yes Troy Sine, MD  amoxicillin (AMOXIL) 500 MG tablet Take 2,000 mg by mouth See admin instructions. Take 4 tablets (2000 mg) by mouth one hour prior to dental appointments 04/24/22  Yes [provider]  apixaban (ELIQUIS) 2.5 MG TABS tablet Take 1 tablet (2.5 mg total) by mouth 2 (two) times daily. 02/11/22  Yes Minus Breeding, MD  candesartan (ATACAND) 16 MG tablet Take 1 tablet (16 mg total) by mouth daily. Patient taking differently: Take 16 mg by mouth every morning. 03/18/22  Yes Troy Sine, MD   Cholecalciferol (VITAMIN D3) 2000 units TABS Take 2,000 Units by mouth daily.   Yes [provider]  dextromethorphan (DELSYM) 30 MG/5ML liquid Take 15 mg by mouth daily as needed for cough.   Yes [provider]  docusate sodium (COLACE) 100 MG capsule Take 1 capsule (100 mg total) by mouth 2 (two) times daily. Patient taking differently: Take 100 mg by mouth 2 (two) times daily as needed (constipation). 08/01/21  Yes Laurey Morale, MD  dofetilide (TIKOSYN) 125 MCG capsule Take 1 capsule (125 mcg total) by mouth 2 (two) times daily. 03/18/22  Yes Troy Sine, MD  ipratropium (ATROVENT) 0.06 % nasal spray Place 2 sprays into both nostrils 2 (two) times daily. 04/28/18  Yes [provider]  ketoconazole (NIZORAL) 2 % cream Apply 1 application topically 2 (two) times daily as needed for irritation. 06/13/21  Yes Laurey Morale, MD  ketotifen (ZADITOR) 0.025 % ophthalmic solution Place 1 drop into the right eye every morning.   Yes [provider]  Liniments (BLUE-EMU SUPER STRENGTH) CREA Apply 1 Application topically daily.   Yes [provider]  loratadine (CLARITIN) 10 MG tablet Take 10 mg by mouth daily as needed (allergies/sinus issues).    Yes [provider]  LORazepam (ATIVAN) 0.5 MG tablet TAKE 1 TABLET BY MOUTH EVERY 6 HOURS AS NEEDED FOR ANXIETY Patient taking differently: Take 0.5 mg by mouth at bedtime. 04/23/22  Yes Sarajane Jews,  Ishmael Holter, MD  Multiple Vitamin (MULTIVITAMIN WITH MINERALS) TABS tablet Take 1 tablet by mouth daily.   Yes [provider]  nitroGLYCERIN (NITROSTAT) 0.4 MG SL tablet DISSOLVE ONE TABLET UNDER THE TONGUE EVERY 5 MINUTES AS NEEDED FOR CHEST PAIN.  DO NOT EXCEED A TOTAL OF 3 DOSES IN 15 MINUTES Patient taking differently: Place 0.4 mg under the tongue every 5 (five) minutes as needed for chest pain. 06/21/21  Yes Troy Sine, MD  pantoprazole (PROTONIX) 40 MG tablet Take 1 tablet (40 mg total) by mouth  daily. Patient taking differently: Take 40 mg by mouth every morning. 01/03/22  Yes Troy Sine, MD  PRESCRIPTION MEDICATION See admin instructions. CPAP- At bedtime   Yes [provider]  Probiotic Product (PROBIOTIC GUMMIES PO) Take 2 tablets by mouth every morning.   Yes [provider]  rosuvastatin (CRESTOR) 40 MG tablet Take 1 tablet (40 mg total) by mouth daily. Patient taking differently: Take 40 mg by mouth at bedtime. 01/03/22  Yes Troy Sine, MD  spironolactone (ALDACTONE) 25 MG tablet Take 1/2 tablet daily on odd days next day on even days take 1/2 tablet twice a day Patient taking differently: Take 12.5 mg by mouth See admin instructions. Take 1/2 tablet (12.5 mg) by mouth in the morning on odd -numbered days;  take 1/2 tablet (12.5 mg) twice a day on even-numbered days 03/13/22  Yes Troy Sine, MD  triamcinolone (NASACORT) 55 MCG/ACT nasal inhaler Place 1 spray into both nostrils daily as needed (for allergies).  10/17/12  Yes Laurey Morale, MD      Allergies    Lidocaine, Morphine, Procaine hcl, Sulfonamide derivatives, Norco [hydrocodone-acetaminophen], Tramadol, Vytorin [ezetimibe-simvastatin], and Tape    Review of Systems   Review of Systems  Constitutional:  Negative for fever.  Respiratory:  Positive for chest tightness and shortness of breath.   Cardiovascular:  Positive for chest pain, palpitations and leg swelling.    Physical Exam Updated Vital Signs BP (!) 151/89   Pulse (!) 50   Temp 99.2 F (37.3 C) (Oral)   Resp 13   SpO2 98%  Physical Exam Vitals and nursing note reviewed.  Constitutional:      Appearance: She is well-developed.  HENT:     Head: Normocephalic and atraumatic.  Cardiovascular:     Rate and Rhythm: Tachycardia present. Rhythm irregular.     Heart sounds: Normal heart sounds.     Comments: HR low 100s Pulmonary:     Effort: Pulmonary effort is normal.     Breath sounds: Normal breath sounds.  Abdominal:      Palpations: Abdomen is soft.     Tenderness: There is no abdominal tenderness.  Musculoskeletal:     Comments: Bilateral ankle edema.  Skin:    General: Skin is warm and dry.  Neurological:     Mental Status: She is alert.     ED Results / Procedures / Treatments   Labs (all labs ordered are listed, but only abnormal results are displayed) Labs Reviewed  BASIC METABOLIC PANEL - Abnormal; Notable for the following components:      Result Value   Glucose, Bld 151 (*)    BUN 28 (*)    Creatinine, Ser 1.33 (*)    GFR, Estimated 40 (*)    All other components within normal limits  TROPONIN I (HIGH SENSITIVITY) - Abnormal; Notable for the following components:   Troponin I (High Sensitivity) 569 (*)  All other components within normal limits  TROPONIN I (HIGH SENSITIVITY) - Abnormal; Notable for the following components:   Troponin I (High Sensitivity) 801 (*)    All other components within normal limits  MAGNESIUM  CBC WITH DIFFERENTIAL/PLATELET  TROPONIN I (HIGH SENSITIVITY)    EKG EKG Interpretation  Date/Time:  Friday May 03 2022 11:15:02 EDT Ventricular Rate:  119 PR Interval:    QRS Duration: 85 QT Interval:  372 QTC Calculation: 524 R Axis:   68 Text Interpretation: Atrial fibrillation Probable LVH with secondary repol abnrm  ST depressions similar to Dec 2021 Prolonged QT interval Confirmed by Sherwood Gambler [id number]) on 05/03/2022 11:38:35 AM  Radiology DG Chest 2 View  Result Date: 05/03/2022 CLINICAL DATA:  Chest pain/tightness and palpitations. History of atrial fibrillation. EXAM: CHEST - 2 VIEW COMPARISON:  Chest radiographs 10/07/2020 and earlier. Cardiac MRI 05/19/2020. FINDINGS: The cardiac silhouette is mildly enlarged. Rounded density posteriorly in the left lung base is chronic and is favored to reflect either a Bochdalek's hernia or focal eventration of the left hemidiaphragm and associated atelectasis. The lungs are clear elsewhere. No pleural  effusion or pneumothorax is identified. A right shoulder arthroplasty is noted. IMPRESSION: No active cardiopulmonary disease. Electronically Signed   By: Logan Bores M.D.   On: 05/03/2022 12:02    Procedures Procedures    Medications Ordered in ED Medications  acetaminophen (TYLENOL) tablet 650 mg (has no administration in time range)  ondansetron (ZOFRAN) injection 4 mg (has no administration in time range)  aspirin chewable tablet 324 mg (324 mg Oral Given 05/03/22 1247)  metoprolol succinate (TOPROL-XL) 24 hr tablet 50 mg (50 mg Oral Given 05/03/22 1316)    ED Course/ Medical Decision Making/ A&P Clinical Course as of 05/03/22 1622  Fri May 03, 2022  1310 I discussed with Dr. Harl Bowie of cardiology.  She recommends seeing with second troponin is based on previous values.  Advises to add Toprol XL 50 mg to her regimen to see if this helps with better A-fib control.  If second troponin is similar, can likely be discharged home with metoprolol prescription. [SG]    Clinical Course User Index [SG] Sherwood Gambler, MD                           Medical Decision Making Amount and/or Complexity of Data Reviewed External Data Reviewed: ECG and notes.    Details: LHC in 2020 with 20% or less obstruction Labs: ordered.    Details: Trops rising from ~500 to 800 Radiology: ordered and independent interpretation performed.    Details: Chest x-ray without edema ECG/medicine tests: ordered and independent interpretation performed.    Details: A-fib with chronic ST/T changes.  Risk OTC drugs. Prescription drug management. Decision regarding hospitalization.   Patient is remaining asymptomatic.  As above, cardiology recommended see what the second troponin did and it went up to 800 from 569.  While she does have the elevated baseline near the 569 mark, this is a little higher and going up.  Discussed again with Dr. Harl Bowie and she recommends continuing to trend troponins but hold off on heparin  for now as this is probably rate related and A-fib related.  Discussed with Dr. Doristine Bosworth for admission.  Cardiology will put in plan of care note and consult in the morning.  Otherwise, patient's heart rate seems to be relatively controlled with oral metoprolol.  Doubt PE, dissection.  No pneumonia or edema seen on  chest x-ray.        Final Clinical Impression(s) / ED Diagnoses Final diagnoses:  Atrial fibrillation, unspecified type [facility])    Rx / DC Orders ED Discharge Orders     None         Sherwood Gambler, MD 05/03/22 1627

## 2022-05-03 NOTE — Telephone Encounter (Signed)
Pt c/o of Chest Pain: STAT if CP now or developed within 24 hours  1. Are you having CP right now? No  2. Are you experiencing any other symptoms (ex. SOB, nausea, vomiting, sweating)? Some SOB, but not like last night  3. How long have you been experiencing CP? Since last night  4. Is your CP continuous or coming and going? Right now coming and going   5. Have you taken Nitroglycerin? Last night took 4 Nitroglycerin. Pt states before she went to bed she started experiencing chest pain and she could sleep because of it. She thought maybe due to her drinking cold water before laying down, but wasn't sure. She said she just sat up the whole night.   ?

## 2022-05-03 NOTE — Progress Notes (Signed)
Received call from Dr. Zenia Resides in [facility] ED. Patient had presented with NSTEMI.  She has a history of A-fib but is not on any AV nodal blocking agents.  She was given Toprol-XL 50 mg today.  Per Dr. Zenia Resides her heart rate fell to 20s to 30s in A-fib.  BP stable.  She was given glucagon and atropine with improvement in heart rate, currently in the 50s in Afib.  BP remained stable and she is not symptomatic currently.  She has been awaiting a bed at [facility].  Recommended ED to ED transfer so patient can come to [facility] now for cardiology evaluation.  Discussed with Dr. Ellyn Hack in interventional cardiology, as may need TVP if becomes bradycardic again as atropine wears off.  If she becomes severely bradycardic again, he would favor starting isoproterenol drip to support her heart rate until metoprolol is out of her system. D/w Dr Chalmers Cater, on call for cardiology tonight, will see her when she arrives to [facility].

## 2022-05-03 NOTE — Consult Note (Addendum)
Cardiology Consultation:   Patient ID: [name] MRN: [id number]; DOB: May 31, 1941  Admit date: 05/03/2022 Date of Consult: 05/03/2022  PCP:  Barbara Morale, MD   [facility] Providers Cardiologist:  Shelva Majestic, MD        Patient Profile:   Barbara [name] is a 81 y.o. female with a hx of pAF, apical HCM, CAD PCI to LAD 2000, HTN, HLD, OSA who is being seen 05/03/2022 for the evaluation of bradycardia at the request of the ED.  History of Present Illness:   Barbara [name]  is an 34 YOF with hx of pAF, apical HCM, CAD PCI to LAD 2000, HTN, HLD, OSA who presented with chest pain that was sudden onset last night improved with nitro.   States about 1 am she had sensation like a "drum beating" on her chest. Thought it was her AF "acting up". Then it got tight and radiated to her arm and then back. It was associated with nausea. She got scared to move and called her friend to drive her to the ED. She is pain free now. She takes tikosyn at home and apixaban for VTE ppx. She has never had a cath before. She smoked many years starting at age 8 until 79. No EtOH or drugs. Her husband passed recently. She lives along but is able to do all her ADL and iADL.    In ED found to be tachycardic to 112. Afebrile, and hypertensive. ECG showed  AF RVR with LVH and replo abnormalities. Trops trended up 569->801->1052->1115. Normal CBC and crt 1.33. Was given metop xl '50mg'$  PO. ED stay complicated by bradycardia down to 20 that responded to atropine and glucagon.  BP remained stable.  Metoprolol PO since held. Became more bradycardic so started on isuprel gtt. Dr. Gardiner Rhyme discussed with Dr. Ellyn Hack case and they favor isuprel for bradycardia until metop wash out.   Patient of Dr. Claiborne Billings. Also has atrial fibrillation. Of note, nuclear stress showed no ischemic in 2021. ECHO 2021 showed hyperdynamic EF. CMR 05/2020 showed apical HCM.   She is on heparin and isuprel. She is chest pain free.     Past Medical History:  Diagnosis Date   A-fib [facility])    Allergy    CAD (coronary artery disease)    sees Dr. Shelva Majestic  cardiac stents - 2000   CHF (congestive heart failure) ([place])    Colon polyps    Complication of anesthesia    rash/hives with "caines"   Dyspnea    02/12/18 " when my heart gets out of rhythm, it has not been out of rhythym- since I have been on Tikosyn (11/2017)   Dysrhythmia    afib fib   GERD (gastroesophageal reflux disease)    takes OTC- Omeprazole- prn   Heart murmur    History of stress test    show normal perfusion without scar or ischemia, post EF 68%   Hx of echocardiogram    show an EF 55%-60% range with grade 1 diastolic dysfunction, she had mitral anular calcification with mild MR, moderate LA dilation and mild pulmonary hypertension with a PA estimated pressure of 67m   Hyperlipidemia    Hypertension    NSTEMI (non-ST elevated myocardial infarction) [place]    Osteoarthritis    Pneumonia    hx of 2015    PONV (postoperative nausea and vomiting)     Past Surgical History:  Procedure Laterality Date   CARDIAC CATHETERIZATION     11/2017  cardiac stents  2000   COLONOSCOPY  01-05-14   per Dr. Teena Irani, clear, no repeats needed    COLONOSCOPY     CORONARY STENT PLACEMENT  2000   in LAD   DIRECT LARYNGOSCOPY WITH RADIAESSE INJECTION N/A 02/13/2018   Procedure: DIRECT LARYNGOSCOPY WITH RADIAESSE INJECTION;  Surgeon: Melida Quitter, MD;  Location: [place];  Service: ENT;  Laterality: N/A;   EYE SURGERY Left    CATARACT REMOVAL   KNEE ARTHROSCOPY Left 01/06/2015   Procedure: LEFT KNEE ARTHROSCOPY, abrasion chondroplasty of the medial femerol condryl,medial and lateral menisectomy, microfracture , synovectomy of the suprpatellar pouch;  Surgeon: Latanya Maudlin, MD;  Location: [facility];  Service: Orthopedics;  Laterality: Left;   LEFT HEART CATH AND CORONARY ANGIOGRAPHY N/A 02/26/2019   Procedure: LEFT HEART CATH AND CORONARY ANGIOGRAPHY;  Surgeon:  [place] Sine, MD;  Location: [place] [facility];  Service: Cardiovascular;  Laterality: N/A;   MICROLARYNGOSCOPY W/VOCAL CORD INJECTION N/A 08/07/2018   Procedure: MICROLARYNGOSCOPY WITH VOCAL CORD INJECTION OF PROLARYN;  Surgeon: Melida Quitter, MD;  Location: [place];  Service: ENT;  Laterality: N/A;  JET VENTILATION   REVERSE SHOULDER ARTHROPLASTY Right 03/23/2020   Procedure: REVERSE SHOULDER ARTHROPLASTY;  Surgeon: Nicholes Stairs, MD;  Location: [place];  Service: Orthopedics;  Laterality: Right;   VAGINAL HYSTERECTOMY  1971     Home Medications:  Prior to Admission medications   Medication Sig Start Date End Date Taking? Authorizing Provider  acetaminophen (TYLENOL) 500 MG tablet Take 500 mg by mouth daily as needed for headache.   Yes [provider]  amLODipine (NORVASC) 5 MG tablet Take 1 tablet (5 mg total) by mouth daily. Patient taking differently: Take 5 mg by mouth every morning. 08/08/21  Yes [place] Sine, MD  amoxicillin (AMOXIL) 500 MG tablet Take 2,000 mg by mouth See admin instructions. Take 4 tablets (2000 mg) by mouth one hour prior to dental appointments 04/24/22  Yes [provider]  apixaban (ELIQUIS) 2.5 MG TABS tablet Take 1 tablet (2.5 mg total) by mouth 2 (two) times daily. 02/11/22  Yes Minus Breeding, MD  candesartan (ATACAND) 16 MG tablet Take 1 tablet (16 mg total) by mouth daily. Patient taking differently: Take 16 mg by mouth every morning. 03/18/22  Yes [place] Sine, MD  Cholecalciferol (VITAMIN D3) 2000 units TABS Take 2,000 Units by mouth daily.   Yes [provider]  dextromethorphan (DELSYM) 30 MG/5ML liquid Take 15 mg by mouth daily as needed for cough.   Yes [provider]  docusate sodium (COLACE) 100 MG capsule Take 1 capsule (100 mg total) by mouth 2 (two) times daily. Patient taking differently: Take 100 mg by mouth 2 (two) times daily as needed (constipation). 08/01/21  Yes Barbara Morale, MD  dofetilide  (TIKOSYN) 125 MCG capsule Take 1 capsule (125 mcg total) by mouth 2 (two) times daily. 03/18/22  Yes [place] Sine, MD  ipratropium (ATROVENT) 0.06 % nasal spray Place 2 sprays into both nostrils 2 (two) times daily. 04/28/18  Yes [provider]  ketoconazole (NIZORAL) 2 % cream Apply 1 application topically 2 (two) times daily as needed for irritation. 06/13/21  Yes Barbara Morale, MD  ketotifen (ZADITOR) 0.025 % ophthalmic solution Place 1 drop into the right eye every morning.   Yes [provider]  Liniments (BLUE-EMU SUPER STRENGTH) CREA Apply 1 Application topically daily.   Yes [provider]  loratadine (CLARITIN) 10 MG tablet Take 10  mg by mouth daily as needed (allergies/sinus issues).    Yes [provider]  LORazepam (ATIVAN) 0.5 MG tablet TAKE 1 TABLET BY MOUTH EVERY 6 HOURS AS NEEDED FOR ANXIETY Patient taking differently: Take 0.5 mg by mouth at bedtime. 04/23/22  Yes Barbara Morale, MD  Multiple Vitamin (MULTIVITAMIN WITH MINERALS) TABS tablet Take 1 tablet by mouth daily.   Yes [provider]  nitroGLYCERIN (NITROSTAT) 0.4 MG SL tablet DISSOLVE ONE TABLET UNDER THE TONGUE EVERY 5 MINUTES AS NEEDED FOR CHEST PAIN.  DO NOT EXCEED A TOTAL OF 3 DOSES IN 15 MINUTES Patient taking differently: Place 0.4 mg under the tongue every 5 (five) minutes as needed for chest pain. 06/21/21  Yes [place] Sine, MD  pantoprazole (PROTONIX) 40 MG tablet Take 1 tablet (40 mg total) by mouth daily. Patient taking differently: Take 40 mg by mouth every morning. 01/03/22  Yes [place] Sine, MD  PRESCRIPTION MEDICATION See admin instructions. CPAP- At bedtime   Yes [provider]  Probiotic Product (PROBIOTIC GUMMIES PO) Take 2 tablets by mouth every morning.   Yes [provider]  rosuvastatin (CRESTOR) 40 MG tablet Take 1 tablet (40 mg total) by mouth daily. Patient taking differently: Take 40 mg by mouth at bedtime. 01/03/22  Yes [place] Sine, MD  spironolactone (ALDACTONE) 25 MG tablet Take 1/2 tablet daily on odd days next day on even days take 1/2 tablet twice a day Patient taking differently: Take 12.5 mg by mouth See admin instructions. Take 1/2 tablet (12.5 mg) by mouth in the morning on odd -numbered days;  take 1/2 tablet (12.5 mg) twice a day on even-numbered days 03/13/22  Yes [place] Sine, MD  triamcinolone (NASACORT) 55 MCG/ACT nasal inhaler Place 1 spray into both nostrils daily as needed (for allergies).  10/17/12  Yes Barbara Morale, MD    Inpatient Medications: Scheduled Meds:  [START ON 05/04/2022] amLODipine  5 mg Oral q morning   atropine  1 mg Intravenous Once   [START ON 05/04/2022] Chlorhexidine Gluconate Cloth  6 each Topical Q0600   [START ON 05/04/2022] irbesartan  150 mg Oral Daily   [START ON 05/04/2022] pantoprazole  40 mg Oral Daily   rosuvastatin  40 mg Oral QHS   [START ON 05/04/2022] spironolactone  12.5 mg Oral QODAY   [START ON 05/05/2022] spironolactone  25 mg Oral QODAY   Continuous Infusions:  heparin 950 Units/hr (05/03/22 2128)   isoproterenol (ISUPREL) 1 mg in dextrose 5 % 250 mL (0.004 mg/mL) infusion 2 mcg/min (05/03/22 2038)   PRN Meds: acetaminophen, docusate sodium, nitroGLYCERIN, polyethylene glycol  Allergies:    Allergies  Allergen Reactions   Lidocaine Anaphylaxis   Morphine Other (See Comments)    "Body shuts down"   Procaine Hcl Anaphylaxis, Rash and Other (See Comments)    "Anything with 'caine' in it "   Sulfonamide Derivatives Hives   Norco [Hydrocodone-Acetaminophen] Other (See Comments)    "Made me sick"   Tramadol Nausea Only   Vytorin [Ezetimibe-Simvastatin] Other (See Comments)    Unknown   Tape Itching and Other (See Comments)    Patient prefers either paper tape or Coban wrap    Social History:   Social History   Socioeconomic History   Marital status: Married    Spouse name: Not on file   Number of children: 2   Years of education: Not on file    Highest education level: Not on file  Occupational History  Not on file  Tobacco Use   Smoking status: Former    Years: 40.00    Types: Cigarettes    Quit date: 11/04/1996    Years since quitting: 25.5   Smokeless tobacco: Never  Vaping Use   Vaping Use: Never used  Substance and Sexual Activity   Alcohol use: No    Alcohol/week: 0.0 standard drinks of alcohol   Drug use: No   Sexual activity: Not on file  Other Topics Concern   Not on file  Social History Narrative   Not on file   Social Determinants of Health   Financial Resource Strain: Low Risk  (06/06/2021)   Overall Financial Resource Strain (CARDIA)    Difficulty of Paying Living Expenses: Not hard at all  Food Insecurity: No Food Insecurity (06/06/2021)   Hunger Vital Sign    Worried About Running Out of Food in the Last Year: Never true    [place] in the Last Year: Never true  Transportation Needs: No Transportation Needs (06/06/2021)   PRAPARE - Hydrologist (Medical): No    Lack of Transportation (Non-Medical): No  Physical Activity: Inactive (06/06/2021)   Exercise Vital Sign    Days of Exercise per Week: 0 days    Minutes of Exercise per Session: 0 min  Stress: No Stress Concern Present (06/06/2021)   [place]    Feeling of Stress : Only a little  Social Connections: Moderately Integrated (06/06/2021)   Social Connection and Isolation Panel [NHANES]    Frequency of Communication with Friends and Family: More than three times a week    Frequency of Social Gatherings with Friends and Family: More than three times a week    Attends Religious Services: More than 4 times per year    Active Member of [employer] or Organizations: No    Attends Archivist Meetings: Never    Marital Status: Married  Human resources officer Violence: Not At Risk (06/06/2021)   Humiliation, Afraid, Rape, and Kick questionnaire    Fear of  Current or Ex-Partner: No    Emotionally Abused: No    Physically Abused: No    Sexually Abused: No    Family History:   Family History  Problem Relation Age of Onset   Cancer Maternal Grandmother    Heart disease Maternal Grandfather      ROS:  Please see the history of present illness.   All other ROS reviewed and negative.     Physical Exam/Data:   Vitals:   05/03/22 2044 05/03/22 2045 05/03/22 2050 05/03/22 2121  BP:  (!) 154/56  (!) 124/58  Pulse: 64 70  64  Resp: (!) 21 (!) 22  15  Temp:   98.2 F (36.8 C) 98.5 F (36.9 C)  TempSrc:   Oral Oral  SpO2: 97% 97%  96%  Weight:    82.2 kg  Height:    '5\' 7"'$  (1.702 m)   No intake or output data in the 24 hours ending 05/03/22 2250    05/03/2022    9:21 PM 05/03/2022    5:22 PM 05/03/2022    4:00 PM  Last 3 Weights  Weight (lbs) 181 lb 3.5 oz 175 lb 175 lb  Weight (kg) 82.2 kg 79.379 kg 79.379 kg     Body mass index is 28.38 kg/m.  General:  Well nourished, well developed, in no acute distress HEENT: normal Neck: no JVD  Vascular: No carotid bruits; Distal pulses 2+ bilaterally Cardiac:  normal S1, S2; irregular; no murmur  Lungs:  clear to auscultation bilaterally, no wheezing, rhonchi or rales  Abd: soft, nontender, no hepatomegaly  Ext: no edema Musculoskeletal:  No deformities, BUE and BLE strength normal and equal Skin: warm and dry  Neuro:  CNs 2-12 intact, no focal abnormalities noted Psych:  Normal affect   EKG:  The EKG was personally reviewed and demonstrates:  AF with slow V response, LVH and repol changes Telemetry:  Telemetry was personally reviewed and demonstrates:  AF with slow V response   Relevant CV Studies: CXR IMPRESSION: No active cardiopulmonary disease. CMR, NMS, and ECHO as above.   Laboratory Data:  High Sensitivity Troponin:   Recent Labs  Lab 05/03/22 1206 05/03/22 1346 05/03/22 1545 05/03/22 1745  TROPONINIHS 569* 801* 1,052* 1,115*     Chemistry Recent Labs  Lab  05/03/22 1206  NA 138  K 4.6  CL 109  CO2 23  GLUCOSE 151*  BUN 28*  CREATININE 1.33*  CALCIUM 9.7  MG 2.2  GFRNONAA 40*  ANIONGAP 6    No results for input(s): "PROT", "ALBUMIN", "AST", "ALT", "ALKPHOS", "BILITOT" in the last 168 hours. Lipids No results for input(s): "CHOL", "TRIG", "HDL", "LABVLDL", "LDLCALC", "CHOLHDL" in the last 168 hours.  Hematology Recent Labs  Lab 05/03/22 1259  WBC 8.5  RBC 4.21  HGB 12.3  HCT 38.2  MCV 90.7  MCH 29.2  MCHC 32.2  RDW 12.5  PLT 177   Thyroid No results for input(s): "TSH", "FREET4" in the last 168 hours.  BNPNo results for input(s): "BNP", "PROBNP" in the last 168 hours.  DDimer No results for input(s): "DDIMER" in the last 168 hours.   Radiology/Studies:  DG Chest 2 View  Result Date: 05/03/2022 CLINICAL DATA:  Chest pain/tightness and palpitations. History of atrial fibrillation. EXAM: CHEST - 2 VIEW COMPARISON:  Chest radiographs 10/07/2020 and earlier. Cardiac MRI 05/19/2020. FINDINGS: The cardiac silhouette is mildly enlarged. Rounded density posteriorly in the left lung base is chronic and is favored to reflect either a Bochdalek's hernia or focal eventration of the left hemidiaphragm and associated atelectasis. The lungs are clear elsewhere. No pleural effusion or pneumothorax is identified. A right shoulder arthroplasty is noted. IMPRESSION: No active cardiopulmonary disease. Electronically Signed   By: Logan Bores M.D.   On: 05/03/2022 12:02     Assessment and Plan:  [place]  is an 18 YOF with hx of pAF, apical HCM, CAD PCI to LAD 2000, HTN, HLD, OSA who presented with chest pain that was sudden onset found to have NSTEMI and symptomatic bradycardia induced by PO metop.   NSTEMI Though AF w RVR may have caused some demand, given description of pain and trop trend, suspect type 1 process and therefore will treat accordingly with heparin.  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 Will discontinue. Given response to isuprel for bradycardia, will continue until BB washes out. Will likely need a PPM given tachybrady syndrome. Can consult EP in the am.  - Continue isuprel - Hold AVN agents  - BB wash out - Tele - CCU - ECHO as above  - Timing of ischemic eval and PPM to be discussed between EP and cards in  am   Apical HCM - stable, no ventricular arrhythmias currently - Will get ECHO to eval for apical aneurysms  HTN - Home antihypertensives  OSA - Home CPAP CKD - stable, daily labs    Risk Assessment/Risk Scores:     TIMI Risk Score for Unstable Angina or Non-ST Elevation MI:   The patient's TIMI risk score is 4, which indicates a 20% risk of all cause mortality, new or recurrent myocardial infarction or need for urgent revascularization in the next 14 days.{    CHA2DS2-VASc Score =   6   Points Metrics  1 Has Congestive Heart Failure:  Yes    Current as of 3 minutes ago  1 Has Vascular Disease:  Yes    Current as of 3 minutes ago  1 Has Hypertension:  Yes    Current as of 3 minutes ago  2 Age:  39    Current as of 3 minutes ago  0 Has Diabetes:  No    Current as of 3 minutes ago  0 Had Stroke:  No  Had TIA:  No  Had Thromboembolism:  No    Current as of 3 minutes ago  1 Female:  Yes    Current as of 3 minutes ago           For questions or updates, please contact [place] Please consult [email or web address] for contact info under    Signed, Margurite Auerbach, MD  05/03/2022 10:50 PM

## 2022-05-03 NOTE — Plan of Care (Signed)
Called about Barbara [name]. 81 y/o F with hx of apical HCM patient of Dr. Claiborne Billings. Also has atrial fibrillation. Nuclear stress showed no ischemic in 2021. She presents with SOB and CP that has resolved. Noted to have troponin elevation. She is stable. No signs of CHF. Her symptoms are noted to have improved. Plan is to start BBand trend troponin observing her overnight. If she has significant delta troponin can start heparin gtt (e.g. 1000). Otherwise patients symptoms have improved. Suspect she has demand ischemia. Again recommend BB. Will place on the list to see in the AM.

## 2022-05-03 NOTE — ED Notes (Signed)
[facility] called for transport to [facility] ED per Dr Zenia Resides EDP

## 2022-05-03 NOTE — ED Provider Notes (Addendum)
Called to bedside due to patient having symptomatic bradycardia.  Heart rate was down in the 20s.  She was treated with atropine 0.5 mg IV push x2.  She subsequently received a milligram of glucagon due to receiving beta-blocker.  Consult to cardiology obtained who recommends patient be sent over to [facility] in the ER.  Patient's blood pressure has been stable.  Heart rate has improved to in the 50s and she is in A-fib.  8:15 PM Patient began to become more bradycardic.  Had prior discussion with cardiology fellow that of this became worse to start patient on isoproterenol drip.  This was ordered   Lacretia Leigh, MD 05/03/22 Marjo Bicker    Lacretia Leigh, MD 05/03/22 2015

## 2022-05-03 NOTE — ED Notes (Signed)
Pt noted to have HR in 30s on cardiac monitor. This RN to pt room, Pt lying in bed. Pt tells this RN she was sitting on side of bed eating her dinner, when she became dizzy, so she laid back in bed. Pt A&O. Pt HR noted to be 20s-40s with pauses. EKG obtained. Attending Opoyd MD notified of Above. EDP Zenia Resides consulted by this RN and EDP Zenia Resides MD to bedside d/t pt HR. Pt placed on Zoll.

## 2022-05-04 ENCOUNTER — Inpatient Hospital Stay (HOSPITAL_COMMUNITY): Payer: Medicare Other

## 2022-05-04 DIAGNOSIS — R001 Bradycardia, unspecified: Secondary | ICD-10-CM | POA: Diagnosis not present

## 2022-05-04 DIAGNOSIS — R079 Chest pain, unspecified: Secondary | ICD-10-CM | POA: Diagnosis not present

## 2022-05-04 DIAGNOSIS — I480 Paroxysmal atrial fibrillation: Secondary | ICD-10-CM | POA: Diagnosis not present

## 2022-05-04 DIAGNOSIS — I214 Non-ST elevation (NSTEMI) myocardial infarction: Secondary | ICD-10-CM | POA: Diagnosis not present

## 2022-05-04 LAB — ECHOCARDIOGRAM COMPLETE
Area-P 1/2: 2.87 cm2
Calc EF: 63.1 %
Height: 67 in
MV M vel: 5.26 m/s
MV Peak grad: 110.7 mmHg
MV VTI: 2.13 cm2
Radius: 0.4 cm
S' Lateral: 2.7 cm
Single Plane A2C EF: 68.3 %
Single Plane A4C EF: 55.7 %
Weight: 2899.49 oz

## 2022-05-04 LAB — CBC
HCT: 35.2 % — ABNORMAL LOW (ref 36.0–46.0)
Hemoglobin: 11.8 g/dL — ABNORMAL LOW (ref 12.0–15.0)
MCH: 29.4 pg (ref 26.0–34.0)
MCHC: 33.5 g/dL (ref 30.0–36.0)
MCV: 87.8 fL (ref 80.0–100.0)
Platelets: 181 10*3/uL (ref 150–400)
RBC: 4.01 MIL/uL (ref 3.87–5.11)
RDW: 12.6 % (ref 11.5–15.5)
WBC: 10.2 10*3/uL (ref 4.0–10.5)
nRBC: 0 % (ref 0.0–0.2)

## 2022-05-04 LAB — COMPREHENSIVE METABOLIC PANEL
ALT: 16 U/L (ref 0–44)
AST: 26 U/L (ref 15–41)
Albumin: 3.7 g/dL (ref 3.5–5.0)
Alkaline Phosphatase: 66 U/L (ref 38–126)
Anion gap: 12 (ref 5–15)
BUN: 24 mg/dL — ABNORMAL HIGH (ref 8–23)
CO2: 23 mmol/L (ref 22–32)
Calcium: 10.1 mg/dL (ref 8.9–10.3)
Chloride: 106 mmol/L (ref 98–111)
Creatinine, Ser: 1.58 mg/dL — ABNORMAL HIGH (ref 0.44–1.00)
GFR, Estimated: 32 mL/min — ABNORMAL LOW (ref >60)
Glucose, Bld: 116 mg/dL — ABNORMAL HIGH (ref 70–99)
Potassium: 4.2 mmol/L (ref 3.5–5.1)
Sodium: 141 mmol/L (ref 135–145)
Total Bilirubin: 0.8 mg/dL (ref 0.3–1.2)
Total Protein: 6.5 g/dL (ref 6.5–8.1)

## 2022-05-04 LAB — APTT
aPTT: 80 seconds — ABNORMAL HIGH (ref 24–36)
aPTT: 88 seconds — ABNORMAL HIGH (ref 24–36)

## 2022-05-04 LAB — MAGNESIUM: Magnesium: 2 mg/dL (ref 1.7–2.4)

## 2022-05-04 LAB — TROPONIN I (HIGH SENSITIVITY): Troponin I (High Sensitivity): 521 ng/L (ref <18)

## 2022-05-04 MED ORDER — HYDRALAZINE HCL 20 MG/ML IJ SOLN
10.0000 mg | INTRAMUSCULAR | Status: DC | PRN
Start: 2022-05-04 — End: 2022-05-10
  Administered 2022-05-04 – 2022-05-08 (×2): 10 mg via INTRAVENOUS
  Filled 2022-05-04: qty 1

## 2022-05-04 MED ORDER — PERFLUTREN LIPID MICROSPHERE
1.0000 mL | INTRAVENOUS | Status: AC | PRN
  Administered 2022-05-04: 2 mL via INTRAVENOUS

## 2022-05-04 MED ORDER — ASPIRIN 81 MG PO CHEW
81.0000 mg | CHEWABLE_TABLET | Freq: Every day | ORAL | Status: DC
Start: 2022-05-04 — End: 2022-05-10
  Administered 2022-05-04 – 2022-05-10 (×6): 81 mg via ORAL
  Filled 2022-05-04 (×6): qty 1

## 2022-05-04 MED ORDER — LIP MEDEX EX OINT
TOPICAL_OINTMENT | CUTANEOUS | Status: DC | PRN
  Administered 2022-05-04: 75 via TOPICAL
  Filled 2022-05-04: qty 7

## 2022-05-04 NOTE — Progress Notes (Signed)
  Echocardiogram 2D Echocardiogram has been performed.  Barbara [name] 05/04/2022, 11:33 AM

## 2022-05-04 NOTE — Progress Notes (Signed)
[place] for Heparin Indication: chest pain/ACS + afib  Allergies  Allergen Reactions   Lidocaine Anaphylaxis   Morphine Other (See Comments)    "Body shuts down"   Procaine Hcl Anaphylaxis, Rash and Other (See Comments)    "Anything with 'caine' in it "   Sulfonamide Derivatives Hives   Norco [Hydrocodone-Acetaminophen] Other (See Comments)    "Made me sick"   Tramadol Nausea Only   Vytorin [Ezetimibe-Simvastatin] Other (See Comments)    Unknown   Tape Itching and Other (See Comments)    Patient prefers either paper tape or Coban wrap    Patient Measurements: Height: '5\' 7"'$  (170.2 cm) Weight: 82.2 kg (181 lb 3.5 oz) IBW/kg (Calculated) : 61.6 Heparin Dosing Weight: 78kg  Vital Signs: Temp: 98 F (36.7 C) (07/01 0824) Temp Source: Oral (07/01 0824) BP: 126/44 (07/01 1100) Pulse Rate: 47 (07/01 1100)  Labs: Recent Labs    05/03/22 1206 05/03/22 1259 05/03/22 1346 05/03/22 1545 05/03/22 1745 05/03/22 1758 05/04/22 0009 05/04/22 0318 05/04/22 0946  HGB  --  12.3  --   --   --   --   --  11.8*  --   HCT  --  38.2  --   --   --   --   --  35.2*  --   PLT  --  177  --   --   --   --   --  181  --   APTT  --   --   --   --   --   --   --  80* 88*  HEPARINUNFRC  --   --   --   --   --  >1.10*  --   --   --   CREATININE 1.33*  --   --   --   --   --   --  1.58*  --   TROPONINIHS 569*  --    < > 1,052* 1,115*  --  521*  --   --    < > = values in this interval not displayed.     Estimated Creatinine Clearance: 29.7 mL/min (A) (by C-G formula based on SCr of 1.58 mg/dL (H)).   Assessment: 81 y.o. female admitted with chest pain and elevated troponin. Pt has hx AF on apixaban PTA, currently holding for LHC on IV heparin.  Repeat aPTT therapeutic at 88 seconds, CBC stable.  Goal of Therapy:  aPTT 66-102 sec Heparin level 0.3-0.7 units/ml Monitor platelets by anticoagulation protocol: Yes   Plan:  Continue heparin 950  units/h Daily aPTT, heparin level, CBC  Arrie Senate, PharmD, [place], [facility] Clinical Pharmacist [phone] Please check AMION for all [place] numbers 05/04/2022

## 2022-05-04 NOTE — Progress Notes (Signed)
eLink Physician-Brief Progress Note Patient Name: LAJUNE PERINE DOB: October 06, 1941 MRN: [id number]   Date of Service  05/04/2022  HPI/Events of Note  Notified by bedside RN of extravasation of the IV running isoproterenol. This has been switched to a different peripheral IV line.   eICU Interventions  Elevate the arm and apply warm compress.      Intervention Category Intermediate Interventions: Other:  Elsie Lincoln 05/04/2022, 3:19 AM

## 2022-05-04 NOTE — Progress Notes (Signed)
RT note: Spoke with pt about CPAP pt states she has nasal pillows at home and does not tolerate the nasal masks we have available. Pt  is tolerating well on 2L [place] at this time. Pt informed to call RT if she changes her mind later. RT will continue to monitor

## 2022-05-04 NOTE — Progress Notes (Signed)
eLink Physician-Brief Progress Note Patient Name: Barbara [name] DOB: 07-11-1941 MRN: [id number]   Date of Service  05/04/2022  HPI/Events of Note  Notified of low diastolic blood pressure.  BP 113/31, MAP 56, HR 60. Pt is on isoproterenol.   eICU Interventions  Continue to monitor to keep SBP >90.  Discontinue amlodipine.      Intervention Category Intermediate Interventions: Hypotension - evaluation and management  Elsie Lincoln 05/04/2022, 12:58 AM

## 2022-05-04 NOTE — Progress Notes (Signed)
NAME:  Barbara [name], MRN:  [id number], DOB:  08/21/1941, LOS: 1 ADMISSION DATE:  05/03/2022, CONSULTATION DATE:  6/30 REFERRING MD:  Dr. Doristine Bosworth, CHIEF COMPLAINT:  NSTEMI   History of Present Illness:  Patient is a 81 year old female with pertinent PMH of HTN, HLD, CAD with stent of LAD and 2000, paroxysmal A-fib on Eliquis, OSA on CPAP presents to [facility] on 6/30 with chest pain/shortness of breath.  Patient states her chest pain and shortness of breath began 6/29 during the night.  She took 4 nitro tabs which relieved the pain.  SOB and palpitations present.  Patient with HR 110s irregular.  Other vital stable.  EKG showing A-fib with ST depression similar to previous EKGs; prolonged QTc.  CXR WNL.  Mag 2.2 and K 4.6.  Troponin initially 569 then 801.  Cards consulted suspect demand ischemia.  Recommending trending troponin and starting on BB.  Patient started on heparin.  Patient was given Toprol 50 mg.  Shortly after receiving patient's HR 20s to 30s in A-fib; BP stable.  Patient was given 2 doses of atropine and glucagon with improvement in heart rate to 50s.  Cardiology recommended transfer to [facility] ICU and starting patient on  isoproterenol drip.  Considering TVP if refractory bradycardia persist.  Patient transferred to [facility] to [facility] ICU.  PCCM consulted.  Pertinent  Medical History   Past Medical History:  Diagnosis Date   A-fib [facility])    Allergy    CAD (coronary artery disease)    sees Dr. Shelva Majestic  cardiac stents - 2000   CHF (congestive heart failure) ([place])    Colon polyps    Complication of anesthesia    rash/hives with "caines"   Dyspnea    02/12/18 " when my heart gets out of rhythm, it has not been out of rhythym- since I have been on Tikosyn (11/2017)   Dysrhythmia    afib fib   GERD (gastroesophageal reflux disease)    takes OTC- Omeprazole- prn   Heart murmur    History of stress test    show normal perfusion without scar or ischemia, post EF 68%   Hx of  echocardiogram    show an EF 55%-60% range with grade 1 diastolic dysfunction, she had mitral anular calcification with mild MR, moderate LA dilation and mild pulmonary hypertension with a PA estimated pressure of 29m   Hyperlipidemia    Hypertension    [place]    Osteoarthritis    Pneumonia    hx of 2015    PONV (postoperative nausea and vomiting)      Significant Hospital Events: Including procedures, antibiotic start and stop dates in addition to other pertinent events   [facility] on BB; became brady 20-30s; responded to atropine and glucagon; started on isoproterenol and [facility] for possible TVP if bradycardia persists; pccm consulted  Interim History / Subjective:   Feels better this morning. No pain in chest.   Objective   Blood pressure (!) 130/36, pulse (!) 118, temperature 98.5 F (36.9 C), temperature source Oral, resp. rate (!) 22, height '5\' 7"'$  (1.702 m), weight 82.2 kg, SpO2 96 %.        Intake/Output Summary (Last 24 hours) at 05/04/2022 0659 Last data filed at 05/04/2022 0000 Gross per 24 hour  Intake 138.55 ml  Output --  Net 138.55 ml   Filed Weights   05/03/22 1600 05/03/22 1722 05/03/22  2121  Weight: 79.4 kg 79.4 kg 82.2 kg    Examination: General:  elderly FM, in bed  HEENT: MMM Neuro: AAOx3, following commands  CV: brady, in low 60s, regular PULM:  diminished breath sounds bilaterally, no wheeze  GI: soft, nt nd  Extremities: no edema Skin: no rash    Resolved Hospital Problem list     Assessment & Plan:   NSTEMI Bradycardia: after receiving toprol 50 mg HR 20-30s; responded to atropine and glucagon; started isoproterenol drip Prolonged qtc Tachybrady syndrome  HTN/HLD: Home meds amlodipine, candesartan, nitro, rosuvastatin, spironolactone Hx of CAD: PCI/stents placed 2000 PAF, A-fib: rate controlled; on tikosyn and eliquis P: - ICU for  observation, planned LHC on Monday  - on isoproterenol  - remains on heparin infusion  - continue statin  - continue home bp meds - appreciate recs from cardiology  - holding tikosyn  - holding spironolactone until after [facility] Monday   OSA on CPAP P: CPAP QHS   CKD 3 P: Follow UOP and renal function   GERD P: Continue PPI   Best Practice (right click and "Reselect all SmartList Selections" daily)   Diet/type: clear liquids DVT prophylaxis: systemic heparin GI prophylaxis: PPI Lines: N/A Foley:  N/A Code Status:  full code Last date of multidisciplinary goals of care discussion: spoke with directly with patient this AM.   Labs   CBC: Recent Labs  Lab 05/03/22 1259 05/04/22 0318  WBC 8.5 10.2  NEUTROABS 7.1  --   HGB 12.3 11.8*  HCT 38.2 35.2*  MCV 90.7 87.8  PLT 177 181     Basic Metabolic Panel: Recent Labs  Lab 05/03/22 1206 05/04/22 0318  NA 138 141  K 4.6 4.2  CL 109 106  CO2 23 23  GLUCOSE 151* 116*  BUN 28* 24*  CREATININE 1.33* 1.58*  CALCIUM 9.7 10.1  MG 2.2 2.0    GFR: Estimated Creatinine Clearance: 29.7 mL/min (A) (by C-G formula based on SCr of 1.58 mg/dL (H)). Recent Labs  Lab 05/03/22 1259 05/04/22 0318  WBC 8.5 10.2     Liver Function Tests: Recent Labs  Lab 05/04/22 0318  AST 26  ALT 16  ALKPHOS 66  BILITOT 0.8  PROT 6.5  ALBUMIN 3.7   No results for input(s): "LIPASE", "AMYLASE" in the last 168 hours. No results for input(s): "AMMONIA" in the last 168 hours.  ABG    Component Value Date/Time   HCO3 30.3 (H) 02/25/2019 1130   TCO2 32 02/25/2019 1130   O2SAT 73.0 02/25/2019 1130     Coagulation Profile: No results for input(s): "INR", "PROTIME" in the last 168 hours.  Cardiac Enzymes: No results for input(s): "CKTOTAL", "CKMB", "CKMBINDEX", "TROPONINI" in the last 168 hours.  HbA1C: Hgb A1c MFr Bld  Date/Time Value Ref Range Status  06/13/2021 02:59 PM 6.1 4.6 - 6.5 % Final    Comment:    Glycemic  Control Guidelines for People with Diabetes:Non Diabetic:  <6%Goal of Therapy: <7%Additional Action Suggested:  >8%   10/08/2020 03:13 AM 5.7 (H) 4.8 - 5.6 % Final    Comment:    (NOTE) Pre diabetes:          5.7%-6.4%  Diabetes:              >6.4%  Glycemic control for   <7.0% adults with diabetes     CBG: No results for input(s): "GLUCAP" in the last 168 hours.  This patient is critically ill with multiple organ system  failure; which, requires frequent high complexity decision making, assessment, support, evaluation, and titration of therapies. This was completed through the application of advanced monitoring technologies and extensive interpretation of multiple databases. During this encounter critical care time was devoted to patient care services described in this note for 32 minutes.   Garner Nash, DO [facility] 05/04/2022 7:00 AM

## 2022-05-04 NOTE — Progress Notes (Signed)
Progress Note  Patient Name: Barbara [name] Date of Encounter: 05/04/2022  [facility] Cardiologist: Shelva Majestic, MD   Subjective   No CP or dyspnea  Inpatient Medications    Scheduled Meds:  atropine  1 mg Intravenous Once   Chlorhexidine Gluconate Cloth  6 each Topical Q0600   irbesartan  150 mg Oral Daily   pantoprazole  40 mg Oral Daily   rosuvastatin  40 mg Oral QHS   spironolactone  12.5 mg Oral QODAY   [START ON 05/05/2022] spironolactone  25 mg Oral QODAY   Continuous Infusions:  heparin 950 Units/hr (05/04/22 0000)   isoproterenol (ISUPREL) 1 mg in dextrose 5 % 250 mL (0.004 mg/mL) infusion 1.5 mcg/min (05/04/22 0420)   PRN Meds: acetaminophen, docusate sodium, nitroGLYCERIN, polyethylene glycol   Vital Signs    Vitals:   05/04/22 0400 05/04/22 0500 05/04/22 0600 05/04/22 0824  BP: 96/64 (!) 113/41 (!) 130/36   Pulse: (!) 58 63 (!) 118   Resp: 20 19 (!) 22   Temp:    98 F (36.7 C)  TempSrc:    Oral  SpO2: 91% 94% 96%   Weight:      Height:        Intake/Output Summary (Last 24 hours) at 05/04/2022 0933 Last data filed at 05/04/2022 0000 Gross per 24 hour  Intake 138.55 ml  Output --  Net 138.55 ml      05/03/2022    9:21 PM 05/03/2022    5:22 PM 05/03/2022    4:00 PM  Last 3 Weights  Weight (lbs) 181 lb 3.5 oz 175 lb 175 lb  Weight (kg) 82.2 kg 79.379 kg 79.379 kg      Telemetry    Sinus bradycardia with short run of atrial fibrillation and PVC- Personally Reviewed  Physical Exam   GEN: No acute distress.   Neck: No JVD Cardiac: RRR, 2/6 systolic murmur Respiratory: Clear to auscultation bilaterally. GI: Soft, nontender, non-distended  MS: No edema Neuro:  Nonfocal  Psych: Normal affect   Labs    High Sensitivity Troponin:   Recent Labs  Lab 05/03/22 1206 05/03/22 1346 05/03/22 1545 05/03/22 1745 05/04/22 0009  TROPONINIHS 569* 801* 1,052* 1,115* 521*     Chemistry Recent Labs  Lab 05/03/22 1206 05/04/22 0318   NA 138 141  K 4.6 4.2  CL 109 106  CO2 23 23  GLUCOSE 151* 116*  BUN 28* 24*  CREATININE 1.33* 1.58*  CALCIUM 9.7 10.1  MG 2.2 2.0  PROT  --  6.5  ALBUMIN  --  3.7  AST  --  26  ALT  --  16  ALKPHOS  --  66  BILITOT  --  0.8  GFRNONAA 40* 32*  ANIONGAP 6 12     Hematology Recent Labs  Lab 05/03/22 1259 05/04/22 0318  WBC 8.5 10.2  RBC 4.21 4.01  HGB 12.3 11.8*  HCT 38.2 35.2*  MCV 90.7 87.8  MCH 29.2 29.4  MCHC 32.2 33.5  RDW 12.5 12.6  PLT 177 181    Radiology    DG Chest 2 View  Result Date: 05/03/2022 CLINICAL DATA:  Chest pain/tightness and palpitations. History of atrial fibrillation. EXAM: CHEST - 2 VIEW COMPARISON:  Chest radiographs 10/07/2020 and earlier. Cardiac MRI 05/19/2020. FINDINGS: The cardiac silhouette is mildly enlarged. Rounded density posteriorly in the left lung base is chronic and is favored to reflect either a Bochdalek's hernia or focal eventration of the left hemidiaphragm and associated  atelectasis. The lungs are clear elsewhere. No pleural effusion or pneumothorax is identified. A right shoulder arthroplasty is noted. IMPRESSION: No active cardiopulmonary disease. Electronically Signed   By: Logan Bores M.D.   On: 05/03/2022 12:02      Patient Profile     81 y.o. female with past medical history of paroxysmal atrial fibrillation, apical hypertrophic cardiomyopathy, coronary artery disease with history of PCI of the LAD, hypertension, hyperlipidemia, obstructive sleep apnea being evaluated for chest pain and bradycardia.  Cardiac MRI July 2021 showed LV apical hypertrophy.  Most recent echocardiogram December 2021 showed normal LV function, apical hypertrophy, mild mitral regurgitation.  Assessment & Plan    1 non-ST elevation myocardial infarction-symptoms at time of admission were concerning.  Troponin has increased.  She remains pain-free.  We will continue aspirin, heparin and statin.  No beta-blocker in the setting of bradycardia.   Await follow-up echocardiogram.  She will need cardiac catheterization on Monday.  The risk and benefits including myocardial infarction, CVA and death discussed and she agrees to proceed.  Hold spironolactone and Avapro in anticipation of catheterization.  Resume after procedure complete.  2 tachybradycardia syndrome-patient noted to be tachycardic in atrial fibrillation at time of admission.  She was given metoprolol with severe bradycardia.  Her heart rate is now improved.  We will discontinue isoproterenol.  No further AV nodal blocking agents.  We will need electrophysiology to see for consideration of pacemaker.  3 paroxysmal atrial fibrillation-apixaban on hold until all procedures complete.  Tikosyn was discontinued at time of admission.  Avoid AV nodal blocking agents given bradycardia.  Await follow-up echocardiogram.  4 hypertension-as above we are holding spironolactone and ARB prior to catheterization.  We will resume after once renal function stable.  5 chronic stage IIIa kidney disease-follow renal function closely after catheterization.  Hold diuretics as outlined above.  No ventriculogram.  6 hyperlipidemia-continue statin.  For questions or updates, please contact [place] Please consult [email or web address] for contact info under        Signed, Kirk Ruths, MD  05/04/2022, 9:33 AM

## 2022-05-04 NOTE — Progress Notes (Signed)
[place] for Heparin Indication: chest pain/ACS + afib Brief A/P: aPTT within goal range Continue Heparin at current rate   Allergies  Allergen Reactions   Lidocaine Anaphylaxis   Morphine Other (See Comments)    "Body shuts down"   Procaine Hcl Anaphylaxis, Rash and Other (See Comments)    "Anything with 'caine' in it "   Sulfonamide Derivatives Hives   Norco [Hydrocodone-Acetaminophen] Other (See Comments)    "Made me sick"   Tramadol Nausea Only   Vytorin [Ezetimibe-Simvastatin] Other (See Comments)    Unknown   Tape Itching and Other (See Comments)    Patient prefers either paper tape or Coban wrap    Patient Measurements: Height: '5\' 7"'$  (170.2 cm) Weight: 82.2 kg (181 lb 3.5 oz) IBW/kg (Calculated) : 61.6 Heparin Dosing Weight: 78kg  Vital Signs: Temp: 98.5 F (36.9 C) (06/30 2121) Temp Source: Oral (06/30 2121) BP: 122/46 (07/01 0300) Pulse Rate: 69 (07/01 0300)  Labs: Recent Labs    05/03/22 1206 05/03/22 1259 05/03/22 1346 05/03/22 1545 05/03/22 1745 05/03/22 1758 05/04/22 0009 05/04/22 0318  HGB  --  12.3  --   --   --   --   --  11.8*  HCT  --  38.2  --   --   --   --   --  35.2*  PLT  --  177  --   --   --   --   --  181  APTT  --   --   --   --   --   --   --  80*  HEPARINUNFRC  --   --   --   --   --  >1.10*  --   --   CREATININE 1.33*  --   --   --   --   --   --  1.58*  TROPONINIHS 569*  --    < > 1,052* 1,115*  --  521*  --    < > = values in this interval not displayed.     Estimated Creatinine Clearance: 29.7 mL/min (A) (by C-G formula based on SCr of 1.58 mg/dL (H)).   Assessment: 81 y.o. female admitted with chest pain and elevated troponin, h/o Afib and Eliquis on hold, for heparin  Goal of Therapy:  aPTT 66-102 sec Heparin level 0.3-0.7 units/ml Monitor platelets by anticoagulation protocol: Yes   Plan:  Continue Heparin at current rate  Check heparin level in 6 hours to verify  Caryl Pina 05/04/2022,4:27 AM

## 2022-05-05 DIAGNOSIS — I200 Unstable angina: Secondary | ICD-10-CM | POA: Diagnosis not present

## 2022-05-05 DIAGNOSIS — I480 Paroxysmal atrial fibrillation: Secondary | ICD-10-CM | POA: Diagnosis not present

## 2022-05-05 DIAGNOSIS — I214 Non-ST elevation (NSTEMI) myocardial infarction: Secondary | ICD-10-CM | POA: Diagnosis not present

## 2022-05-05 DIAGNOSIS — R001 Bradycardia, unspecified: Secondary | ICD-10-CM | POA: Diagnosis not present

## 2022-05-05 LAB — CBC
HCT: 37.3 % (ref 36.0–46.0)
Hemoglobin: 12.7 g/dL (ref 12.0–15.0)
MCH: 29.7 pg (ref 26.0–34.0)
MCHC: 34 g/dL (ref 30.0–36.0)
MCV: 87.1 fL (ref 80.0–100.0)
Platelets: 188 10*3/uL (ref 150–400)
RBC: 4.28 MIL/uL (ref 3.87–5.11)
RDW: 12.7 % (ref 11.5–15.5)
WBC: 11.8 10*3/uL — ABNORMAL HIGH (ref 4.0–10.5)
nRBC: 0 % (ref 0.0–0.2)

## 2022-05-05 LAB — URINALYSIS, ROUTINE W REFLEX MICROSCOPIC
Bilirubin Urine: NEGATIVE
Glucose, UA: NEGATIVE mg/dL
Ketones, ur: NEGATIVE mg/dL
Leukocytes,Ua: NEGATIVE
Nitrite: NEGATIVE
Protein, ur: 30 mg/dL — AB
RBC / HPF: >50 RBC/hpf — ABNORMAL HIGH (ref 0–5)
Specific Gravity, Urine: 1.011 (ref 1.005–1.030)
pH: 6 (ref 5.0–8.0)

## 2022-05-05 LAB — HEPARIN LEVEL (UNFRACTIONATED): Heparin Unfractionated: >1.1 IU/mL — ABNORMAL HIGH (ref 0.30–0.70)

## 2022-05-05 LAB — SURGICAL PCR SCREEN
MRSA, PCR: NEGATIVE
Staphylococcus aureus: NEGATIVE

## 2022-05-05 LAB — APTT
aPTT: 143 seconds — ABNORMAL HIGH (ref 24–36)
aPTT: 119 seconds — ABNORMAL HIGH (ref 24–36)

## 2022-05-05 MED ORDER — SODIUM CHLORIDE 0.9 % IV SOLN: 250.0000 mL | INTRAVENOUS | Status: DC | PRN

## 2022-05-05 MED ORDER — CHLORHEXIDINE GLUCONATE 4 % EX LIQD
4.0000 | Freq: Once | CUTANEOUS | Status: AC
  Administered 2022-05-06: 4 via TOPICAL
  Filled 2022-05-05: qty 60

## 2022-05-05 MED ORDER — SODIUM CHLORIDE 0.9% FLUSH
3.0000 mL | Freq: Two times a day (BID) | INTRAVENOUS | Status: DC
  Administered 2022-05-05 – 2022-05-10 (×9): 3 mL via INTRAVENOUS

## 2022-05-05 MED ORDER — SODIUM CHLORIDE 0.9 % IV SOLN: INTRAVENOUS | Status: DC

## 2022-05-05 MED ORDER — SODIUM CHLORIDE 0.9% FLUSH: 3.0000 mL | INTRAVENOUS | Status: DC | PRN

## 2022-05-05 MED ORDER — SODIUM CHLORIDE 0.9 % IV SOLN
80.0000 mg | INTRAVENOUS | Status: AC
  Filled 2022-05-05 (×3): qty 2

## 2022-05-05 MED ORDER — CEFAZOLIN SODIUM-DEXTROSE 2-4 GM/100ML-% IV SOLN
2.0000 g | INTRAVENOUS | Status: AC
  Filled 2022-05-05 (×2): qty 100

## 2022-05-05 MED ORDER — ISOSORBIDE MONONITRATE ER 30 MG PO TB24
30.0000 mg | ORAL_TABLET | Freq: Every day | ORAL | Status: DC
  Administered 2022-05-05 – 2022-05-10 (×6): 30 mg via ORAL
  Filled 2022-05-05 (×6): qty 1

## 2022-05-05 MED ORDER — ASPIRIN 81 MG PO CHEW
81.0000 mg | CHEWABLE_TABLET | ORAL | Status: AC
  Administered 2022-05-06: 81 mg via ORAL
  Filled 2022-05-05: qty 1

## 2022-05-05 MED ORDER — SODIUM CHLORIDE 0.9 % WEIGHT BASED INFUSION
1.0000 mL/kg/h | INTRAVENOUS | Status: DC
  Administered 2022-05-06: 1 mL/kg/h via INTRAVENOUS

## 2022-05-05 MED ORDER — HYDRALAZINE HCL 50 MG PO TABS
50.0000 mg | ORAL_TABLET | Freq: Three times a day (TID) | ORAL | Status: DC
  Administered 2022-05-05 – 2022-05-10 (×16): 50 mg via ORAL
  Filled 2022-05-05 (×16): qty 1

## 2022-05-05 MED ORDER — SODIUM CHLORIDE 0.9 % WEIGHT BASED INFUSION
3.0000 mL/kg/h | INTRAVENOUS | Status: DC
  Administered 2022-05-06: 3 mL/kg/h via INTRAVENOUS

## 2022-05-05 MED ORDER — ONDANSETRON HCL 4 MG/2ML IJ SOLN
4.0000 mg | Freq: Four times a day (QID) | INTRAMUSCULAR | Status: DC | PRN
  Administered 2022-05-06: 4 mg via INTRAVENOUS
  Filled 2022-05-05: qty 2

## 2022-05-05 NOTE — Progress Notes (Signed)
PROGRESS NOTE                                                                                                                                                                                                             Patient Demographics:    Barbara [name], is a 81 y.o. female, DOB - 1941/08/29, CBU:[id number]  Outpatient Primary MD for the patient is Laurey Morale, MD    LOS - 2  Admit date - 05/03/2022    Chief Complaint  Patient presents with   Chest Pain   Palpitations       Brief Narrative (HPI from H&P)   81 year old female with pertinent PMH of HTN, HLD, CAD with stent of LAD and 2000, paroxysmal A-fib on Eliquis, OSA on nasal CPAP presents to [facility] on 6/30 with chest pain/shortness of breath, she was diagnosed with NSTEMI, she was seen by PCCM and cardiology placed on heparin drip.  Currently stable transferred to my care on 05/05/2022   Subjective:    Barbara [name] today has, No headache, No chest pain, No abdominal pain - No Nausea, No new weakness tingling or numbness, no SOB.   Assessment  & Plan :     NSTEMI with tachybradycardia syndrome.  Currently chest pain-free, on heparin drip, had received Toprol-XL causing some bradycardia after which she briefly required isoproterenol drip.  Currently heart rate better avoid rate lowering and nodal blocking agents.  Continue aspirin and statin for secondary prevention along with heparin drip.  Cardiology contemplating left heart cath this admission.   2.  Tachybradycardia syndrome.  As in #1 above.  EP also following.  Avoid nodal blocking agents.  3.  GERD.  On PPI.  4.  Essential hypertension.  Placed on combination of hydralazine and Imdur.  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  5.  Dyslipidemia.  On statin.  6.  CKD 3 a baseline creatinine close to 1.5.  Monitor.  7.  Mild nonspecific leukocytosis.  Afebrile, check UA and monitor.        Condition  - Fair  Family Communication  : None present  Code Status :  Full  Consults  :  Cards, PCCM  PUD Prophylaxis : PPI   Procedures  :     TTE - 1. Left ventricular ejection fraction, by estimation, is >75%. The left  ventricle has hyperdynamic function. The left ventricle has no regional wall motion abnormalities. Left ventricular diastolic parameters are indeterminate. Elevated left atrial pressure.  2. Right ventricular systolic function is normal. The right ventricular size is normal. There is mildly elevated pulmonary artery systolic pressure.  3. Left atrial size was moderately dilated.  4. The mitral valve is normal in structure. Moderate mitral valve regurgitation. No evidence of mitral stenosis.  5. The aortic valve is tricuspid. Aortic valve regurgitation is not visualized. Aortic valve sclerosis is present, with no evidence of aortic valve stenosis.  6. The inferior vena cava is normal in size with greater than 50% respiratory variability, suggesting right atrial pressure of 3 mmHg.      Disposition Plan  :    Status is: Inpatient  DVT Prophylaxis  :  Hep gtt  SCDs Start: 05/03/22 1602     Lab Results  Component Value Date   PLT 188 05/05/2022    Diet :  Diet Order             Diet regular Room service appropriate? Yes; Fluid consistency: Thin  Diet effective now                    Inpatient Medications  Scheduled Meds:  aspirin  81 mg Oral Daily   atropine  1 mg Intravenous Once   Chlorhexidine Gluconate Cloth  6 each Topical Q0600   hydrALAZINE  50 mg Oral Q8H   isosorbide mononitrate  30 mg Oral Daily   pantoprazole  40 mg Oral Daily   rosuvastatin  40 mg Oral QHS   sodium chloride flush  3 mL Intravenous Q12H   Continuous Infusions:  heparin 850 Units/hr (05/05/22 0800)   PRN Meds:.acetaminophen, docusate sodium, hydrALAZINE, lip balm, nitroGLYCERIN, ondansetron (ZOFRAN) IV, polyethylene glycol  Antibiotics  :    Anti-infectives (From  admission, onward)    None        Time Spent in minutes  30   Lala Lund M.D on 05/05/2022 at 9:39 AM  To page go to [email or web address]   [facility] -  Office  [phone]  See all Orders from today for further details    Objective:   Vitals:   05/05/22 0500 05/05/22 0600 05/05/22 0700 05/05/22 0900  BP: (!) 142/51 (!) 161/59 140/71 (!) 134/46  Pulse: (!) 47 (!) 49 (!) 57 (!) 58  Resp:    19  Temp:      TempSrc:      SpO2: 98% 98% 95% 98%  Weight: 79.7 kg     Height:        Wt Readings from Last 3 Encounters:  05/05/22 79.7 kg  03/13/22 80.3 kg  01/08/22 79.4 kg     Intake/Output Summary (Last 24 hours) at 05/05/2022 0939 Last data filed at 05/05/2022 0800 Gross per 24 hour  Intake 499.32 ml  Output 1500 ml  Net -1000.68 ml     Physical Exam  Awake Alert, No new F.N deficits, Normal affect [place].AT,PERRAL Supple Neck, No JVD,   Symmetrical Chest wall movement, Good air movement bilaterally, CTAB RRR,No Gallops,Rubs or new Murmurs,  +ve B.Sounds, Abd Soft, No tenderness,   No Cyanosis, Clubbing or edema       Data Review:    CBC Recent Labs  Lab 05/03/22 1259 05/04/22 0318 05/05/22 0044  WBC 8.5 10.2 11.8*  HGB 12.3 11.8* 12.7  HCT 38.2 35.2* 37.3  PLT 177 181 188  MCV 90.7 87.8 87.1  MCH 29.2 29.4 29.7  MCHC 32.2 33.5 34.0  RDW 12.5 12.6 12.7  LYMPHSABS 1.0  --   --   MONOABS 0.5  --   --   EOSABS 0.0  --   --   BASOSABS 0.0  --   --     Electrolytes Recent Labs  Lab 05/03/22 1206 05/04/22 0318  NA 138 141  K 4.6 4.2  CL 109 106  CO2 23 23  GLUCOSE 151* 116*  BUN 28* 24*  CREATININE 1.33* 1.58*  CALCIUM 9.7 10.1  AST  --  26  ALT  --  16  ALKPHOS  --  66  BILITOT  --  0.8  ALBUMIN  --  3.7  MG 2.2 2.0   Micro Results Recent Results (from the past 240 hour(s))  MRSA Next Gen by PCR, Nasal     Status: None   Collection Time: 05/03/22  9:19 PM   Specimen: Nasal Mucosa; Nasal Swab  Result Value Ref Range Status    MRSA by PCR Next Gen NOT DETECTED NOT DETECTED Final    Comment: (NOTE) The GeneXpert MRSA Assay (FDA approved for NASAL specimens only), is one component of a comprehensive MRSA colonization surveillance program. It is not intended to diagnose MRSA infection nor to guide or monitor treatment for MRSA infections. Test performance is not FDA approved in patients less than 24 years old. Performed at [facility], [address] [place], [place] [address]     Radiology Reports ECHOCARDIOGRAM COMPLETE  Result Date: 05/04/2022    ECHOCARDIOGRAM REPORT   Patient Name:   ZYLIAH SCHIER Date of Exam: 05/04/2022 Medical Rec #:  [id number]              Height:       67.0 in Accession #:    [id number]             Weight:       181.2 lb Date of Birth:  Dec 12, 1940              BSA:          1.939 m Patient Age:    88 years               BP:           133/41 mmHg Patient Gender: F                      HR:           45 bpm. Exam Location:  Inpatient Procedure: 2D Echo, Color Doppler, Cardiac Doppler and Intracardiac            Opacification Agent Indications:    R07.9* Chest pain, unspecified; 122-I22.9 Subsequent ST                 elevation (STEM) and non-ST elevation (NSTEMI) myocardial                 infarction  History:        Patient has prior history of Echocardiogram examinations, most                 recent 10/08/2020. CAD, Abnormal ECG, Arrythmias:Bradycardia and                 Atrial Fibrillation, Signs/Symptoms:Chest Pain and Dyspnea; Risk                 Factors:Dyslipidemia and Hypertension.  Sonographer:  Roseanna Rainbow RDCS Referring Phys: Margurite Auerbach  Sonographer Comments: Technically difficult study due to poor echo windows. Image acquisition challenging due to patient body habitus. IMPRESSIONS  1. Left ventricular ejection fraction, by estimation, is >75%. The left ventricle has hyperdynamic function. The left ventricle has no regional wall motion abnormalities. Left ventricular  diastolic parameters are indeterminate. Elevated left atrial pressure.  2. Right ventricular systolic function is normal. The right ventricular size is normal. There is mildly elevated pulmonary artery systolic pressure.  3. Left atrial size was moderately dilated.  4. The mitral valve is normal in structure. Moderate mitral valve regurgitation. No evidence of mitral stenosis.  5. The aortic valve is tricuspid. Aortic valve regurgitation is not visualized. Aortic valve sclerosis is present, with no evidence of aortic valve stenosis.  6. The inferior vena cava is normal in size with greater than 50% respiratory variability, suggesting right atrial pressure of 3 mmHg. FINDINGS  Left Ventricle: Left ventricular ejection fraction, by estimation, is >75%. The left ventricle has hyperdynamic function. The left ventricle has no regional wall motion abnormalities. Definity contrast agent was given IV to delineate the left ventricular endocardial borders. The left ventricular internal cavity size was normal in size. There is no left ventricular hypertrophy. Left ventricular diastolic parameters are indeterminate. Elevated left atrial pressure. Right Ventricle: The right ventricular size is normal. Right ventricular systolic function is normal. There is mildly elevated pulmonary artery systolic pressure. The tricuspid regurgitant velocity is 3.14 m/s, and with an assumed right atrial pressure of 3 mmHg, the estimated right ventricular systolic pressure is 17.6 mmHg. Left Atrium: Left atrial size was moderately dilated. Right Atrium: Right atrial size was normal in size. Pericardium: There is no evidence of pericardial effusion. Mitral Valve: The mitral valve is normal in structure. Moderate mitral valve regurgitation. No evidence of mitral valve stenosis. MV peak gradient, 8.6 mmHg. The mean mitral valve gradient is 2.0 mmHg. Tricuspid Valve: The tricuspid valve is normal in structure. Tricuspid valve regurgitation is mild .  No evidence of tricuspid stenosis. Aortic Valve: The aortic valve is tricuspid. Aortic valve regurgitation is not visualized. Aortic valve sclerosis is present, with no evidence of aortic valve stenosis. Pulmonic Valve: The pulmonic valve was normal in structure. Pulmonic valve regurgitation is trivial. No evidence of pulmonic stenosis. Aorta: The aortic root is normal in size and structure. Venous: The inferior vena cava is normal in size with greater than 50% respiratory variability, suggesting right atrial pressure of 3 mmHg. IAS/Shunts: No atrial level shunt detected by color flow Doppler.  LEFT VENTRICLE PLAX 2D LVIDd:         4.80 cm      Diastology LVIDs:         2.70 cm      LV e' medial:    3.50 cm/s LV PW:         0.80 cm      LV E/e' medial:  40.3 LV IVS:        1.00 cm      LV e' lateral:   7.56 cm/s LVOT diam:     1.70 cm      LV E/e' lateral: 18.7 LV SV:         82 LV SV Index:   42 LVOT Area:     2.27 cm  LV Volumes (MOD) LV vol d, MOD A2C: 111.0 ml LV vol d, MOD A4C: 79.0 ml LV vol s, MOD A2C: 35.2 ml LV vol s, MOD  A4C: 35.0 ml LV SV MOD A2C:     75.8 ml LV SV MOD A4C:     79.0 ml LV SV MOD BP:      59.2 ml RIGHT VENTRICLE             IVC RV S prime:     10.50 cm/s  IVC diam: 1.70 cm TAPSE (M-mode): 1.4 cm LEFT ATRIUM             Index        RIGHT ATRIUM           Index LA diam:        5.20 cm 2.68 cm/m   RA Area:     19.70 cm LA Vol (A2C):   79.9 ml 41.20 ml/m  RA Volume:   57.70 ml  29.75 ml/m LA Vol (A4C):   66.9 ml 34.50 ml/m LA Biplane Vol: 74.7 ml 38.52 ml/m  AORTIC VALVE LVOT Vmax:   156.00 cm/s LVOT Vmean:  96.000 cm/s LVOT VTI:    0.362 m  AORTA Ao Root diam: 2.50 cm Ao Asc diam:  3.00 cm MITRAL VALVE                  TRICUSPID VALVE MV Area (PHT): 2.87 cm       TR Peak grad:   39.4 mmHg MV Area VTI:   2.13 cm       TR Vmax:        314.00 cm/s MV Peak grad:  8.6 mmHg MV Mean grad:  2.0 mmHg       SHUNTS MV Vmax:       1.47 m/s       Systemic VTI:  0.36 m MV Vmean:      57.4 cm/s       Systemic Diam: 1.70 cm MV Decel Time: 264 msec MR Peak grad:    110.7 mmHg MR Mean grad:    77.0 mmHg MR Vmax:         526.00 cm/s MR Vmean:        409.0 cm/s MR PISA:         1.01 cm MR PISA Eff ROA: 8 mm MR PISA Radius:  0.40 cm MV E velocity: 141.00 cm/s MV A velocity: 49.30 cm/s MV E/A ratio:  2.86 Kirk Ruths MD Electronically signed by Kirk Ruths MD Signature Date/Time: 05/04/2022/11:50:25 AM    Final    DG Chest 2 View  Result Date: 05/03/2022 CLINICAL DATA:  Chest pain/tightness and palpitations. History of atrial fibrillation. EXAM: CHEST - 2 VIEW COMPARISON:  Chest radiographs 10/07/2020 and earlier. Cardiac MRI 05/19/2020. FINDINGS: The cardiac silhouette is mildly enlarged. Rounded density posteriorly in the left lung base is chronic and is favored to reflect either a Bochdalek's hernia or focal eventration of the left hemidiaphragm and associated atelectasis. The lungs are clear elsewhere. No pleural effusion or pneumothorax is identified. A right shoulder arthroplasty is noted. IMPRESSION: No active cardiopulmonary disease. Electronically Signed   By: Logan Bores M.D.   On: 05/03/2022 12:02

## 2022-05-05 NOTE — H&P (View-Only) (Signed)
Progress Note  Patient Name: Barbara [name] Date of Encounter: 05/05/2022  [facility] Cardiologist: Shelva Majestic, MD   Subjective   Denies CP or dyspnea  Inpatient Medications    Scheduled Meds:  aspirin  81 mg Oral Daily   atropine  1 mg Intravenous Once   Chlorhexidine Gluconate Cloth  6 each Topical Q0600   hydrALAZINE  50 mg Oral Q8H   isosorbide mononitrate  30 mg Oral Daily   pantoprazole  40 mg Oral Daily   rosuvastatin  40 mg Oral QHS   Continuous Infusions:  heparin 850 Units/hr (05/05/22 0600)   PRN Meds: acetaminophen, docusate sodium, hydrALAZINE, lip balm, nitroGLYCERIN, ondansetron (ZOFRAN) IV, polyethylene glycol   Vital Signs    Vitals:   05/05/22 0300 05/05/22 0400 05/05/22 0500 05/05/22 0600  BP: (!) 148/54 (!) 139/52 (!) 142/51 (!) 161/59  Pulse: (!) 52 (!) 49 (!) 47 (!) 49  Resp:      Temp:      TempSrc:      SpO2: 96% 97% 98% 98%  Weight:   79.7 kg   Height:        Intake/Output Summary (Last 24 hours) at 05/05/2022 0835 Last data filed at 05/05/2022 0600 Gross per 24 hour  Intake 482.32 ml  Output 1500 ml  Net -1017.68 ml       05/05/2022    5:00 AM 05/03/2022    9:21 PM 05/03/2022    5:22 PM  Last 3 Weights  Weight (lbs) 175 lb 12.8 oz 181 lb 3.5 oz 175 lb  Weight (kg) 79.742 kg 82.2 kg 79.379 kg      Telemetry    Sinus bradycardia- Personally Reviewed  Physical Exam   GEN: NAD Neck: Supple Cardiac: RRR, 2/6 systolic murmur, no gallop Respiratory: CTA GI: Soft, NT/ND MS: No edema Neuro:  Grossly intact Psych: Normal affect   Labs    High Sensitivity Troponin:   Recent Labs  Lab 05/03/22 1206 05/03/22 1346 05/03/22 1545 05/03/22 1745 05/04/22 0009  TROPONINIHS 569* 801* 1,052* 1,115* 521*      Chemistry Recent Labs  Lab 05/03/22 1206 05/04/22 0318  NA 138 141  K 4.6 4.2  CL 109 106  CO2 23 23  GLUCOSE 151* 116*  BUN 28* 24*  CREATININE 1.33* 1.58*  CALCIUM 9.7 10.1  MG 2.2 2.0  PROT  --   6.5  ALBUMIN  --  3.7  AST  --  26  ALT  --  16  ALKPHOS  --  66  BILITOT  --  0.8  GFRNONAA 40* 32*  ANIONGAP 6 12      Hematology Recent Labs  Lab 05/03/22 1259 05/04/22 0318 05/05/22 0044  WBC 8.5 10.2 11.8*  RBC 4.21 4.01 4.28  HGB 12.3 11.8* 12.7  HCT 38.2 35.2* 37.3  MCV 90.7 87.8 87.1  MCH 29.2 29.4 29.7  MCHC 32.2 33.5 34.0  RDW 12.5 12.6 12.7  PLT 177 181 188     Radiology    ECHOCARDIOGRAM COMPLETE  Result Date: 05/04/2022    ECHOCARDIOGRAM REPORT   Patient Name:   Barbara [name] Date of Exam: 05/04/2022 Medical Rec #:  [id number]              Height:       67.0 in Accession #:    [id number]             Weight:       181.2 lb Date of Birth:  03-03-1941              BSA:          1.939 m Patient Age:    81 years               BP:           133/41 mmHg Patient Gender: F                      HR:           45 bpm. Exam Location:  Inpatient Procedure: 2D Echo, Color Doppler, Cardiac Doppler and Intracardiac            Opacification Agent Indications:    R07.9* Chest pain, unspecified; 122-I22.9 Subsequent ST                 elevation (STEM) and non-ST elevation (NSTEMI) myocardial                 infarction  History:        Patient has prior history of Echocardiogram examinations, most                 recent 10/08/2020. CAD, Abnormal ECG, Arrythmias:Bradycardia and                 Atrial Fibrillation, Signs/Symptoms:Chest Pain and Dyspnea; Risk                 Factors:Dyslipidemia and Hypertension.  Sonographer:    Roseanna Rainbow RDCS Referring Phys: Margurite Auerbach  Sonographer Comments: Technically difficult study due to poor echo windows. Image acquisition challenging due to patient body habitus. IMPRESSIONS  1. Left ventricular ejection fraction, by estimation, is >75%. The left ventricle has hyperdynamic function. The left ventricle has no regional wall motion abnormalities. Left ventricular diastolic parameters are indeterminate. Elevated left atrial pressure.  2. Right  ventricular systolic function is normal. The right ventricular size is normal. There is mildly elevated pulmonary artery systolic pressure.  3. Left atrial size was moderately dilated.  4. The mitral valve is normal in structure. Moderate mitral valve regurgitation. No evidence of mitral stenosis.  5. The aortic valve is tricuspid. Aortic valve regurgitation is not visualized. Aortic valve sclerosis is present, with no evidence of aortic valve stenosis.  6. The inferior vena cava is normal in size with greater than 50% respiratory variability, suggesting right atrial pressure of 3 mmHg. FINDINGS  Left Ventricle: Left ventricular ejection fraction, by estimation, is >75%. The left ventricle has hyperdynamic function. The left ventricle has no regional wall motion abnormalities. Definity contrast agent was given IV to delineate the left ventricular endocardial borders. The left ventricular internal cavity size was normal in size. There is no left ventricular hypertrophy. Left ventricular diastolic parameters are indeterminate. Elevated left atrial pressure. Right Ventricle: The right ventricular size is normal. Right ventricular systolic function is normal. There is mildly elevated pulmonary artery systolic pressure. The tricuspid regurgitant velocity is 3.14 m/s, and with an assumed right atrial pressure of 3 mmHg, the estimated right ventricular systolic pressure is 16.1 mmHg. Left Atrium: Left atrial size was moderately dilated. Right Atrium: Right atrial size was normal in size. Pericardium: There is no evidence of pericardial effusion. Mitral Valve: The mitral valve is normal in structure. Moderate mitral valve regurgitation. No evidence of mitral valve stenosis. MV peak gradient, 8.6 mmHg. The mean mitral valve gradient is 2.0 mmHg. Tricuspid Valve: The tricuspid valve is normal in  structure. Tricuspid valve regurgitation is mild . No evidence of tricuspid stenosis. Aortic Valve: The aortic valve is tricuspid.  Aortic valve regurgitation is not visualized. Aortic valve sclerosis is present, with no evidence of aortic valve stenosis. Pulmonic Valve: The pulmonic valve was normal in structure. Pulmonic valve regurgitation is trivial. No evidence of pulmonic stenosis. Aorta: The aortic root is normal in size and structure. Venous: The inferior vena cava is normal in size with greater than 50% respiratory variability, suggesting right atrial pressure of 3 mmHg. IAS/Shunts: No atrial level shunt detected by color flow Doppler.  LEFT VENTRICLE PLAX 2D LVIDd:         4.80 cm      Diastology LVIDs:         2.70 cm      LV e' medial:    3.50 cm/s LV PW:         0.80 cm      LV E/e' medial:  40.3 LV IVS:        1.00 cm      LV e' lateral:   7.56 cm/s LVOT diam:     1.70 cm      LV E/e' lateral: 18.7 LV SV:         82 LV SV Index:   42 LVOT Area:     2.27 cm  LV Volumes (MOD) LV vol d, MOD A2C: 111.0 ml LV vol d, MOD A4C: 79.0 ml LV vol s, MOD A2C: 35.2 ml LV vol s, MOD A4C: 35.0 ml LV SV MOD A2C:     75.8 ml LV SV MOD A4C:     79.0 ml LV SV MOD BP:      59.2 ml RIGHT VENTRICLE             IVC RV S prime:     10.50 cm/s  IVC diam: 1.70 cm TAPSE (M-mode): 1.4 cm LEFT ATRIUM             Index        RIGHT ATRIUM           Index LA diam:        5.20 cm 2.68 cm/m   RA Area:     19.70 cm LA Vol (A2C):   79.9 ml 41.20 ml/m  RA Volume:   57.70 ml  29.75 ml/m LA Vol (A4C):   66.9 ml 34.50 ml/m LA Biplane Vol: 74.7 ml 38.52 ml/m  AORTIC VALVE LVOT Vmax:   156.00 cm/s LVOT Vmean:  96.000 cm/s LVOT VTI:    0.362 m  AORTA Ao Root diam: 2.50 cm Ao Asc diam:  3.00 cm MITRAL VALVE                  TRICUSPID VALVE MV Area (PHT): 2.87 cm       TR Peak grad:   39.4 mmHg MV Area VTI:   2.13 cm       TR Vmax:        314.00 cm/s MV Peak grad:  8.6 mmHg MV Mean grad:  2.0 mmHg       SHUNTS MV Vmax:       1.47 m/s       Systemic VTI:  0.36 m MV Vmean:      57.4 cm/s      Systemic Diam: 1.70 cm MV Decel Time: 264 msec MR Peak grad:    110.7 mmHg MR  Mean grad:    77.0 mmHg MR Vmax:  526.00 cm/s MR Vmean:        409.0 cm/s MR PISA:         1.01 cm MR PISA Eff ROA: 8 mm MR PISA Radius:  0.40 cm MV E velocity: 141.00 cm/s MV A velocity: 49.30 cm/s MV E/A ratio:  2.86 Kirk Ruths MD Electronically signed by Kirk Ruths MD Signature Date/Time: 05/04/2022/11:50:25 AM    Final    DG Chest 2 View  Result Date: 05/03/2022 CLINICAL DATA:  Chest pain/tightness and palpitations. History of atrial fibrillation. EXAM: CHEST - 2 VIEW COMPARISON:  Chest radiographs 10/07/2020 and earlier. Cardiac MRI 05/19/2020. FINDINGS: The cardiac silhouette is mildly enlarged. Rounded density posteriorly in the left lung base is chronic and is favored to reflect either a Bochdalek's hernia or focal eventration of the left hemidiaphragm and associated atelectasis. The lungs are clear elsewhere. No pleural effusion or pneumothorax is identified. A right shoulder arthroplasty is noted. IMPRESSION: No active cardiopulmonary disease. Electronically Signed   By: Logan Bores M.D.   On: 05/03/2022 12:02      Patient Profile     81 y.o. female with past medical history of paroxysmal atrial fibrillation, apical hypertrophic cardiomyopathy, coronary artery disease with history of PCI of the LAD, hypertension, hyperlipidemia, obstructive sleep apnea being evaluated for chest pain and bradycardia.  Cardiac MRI July 2021 showed LV apical hypertrophy.  Echocardiogram this admission shows vigorous LV function, moderate left atrial enlargement and moderate mitral regurgitation.  Assessment & Plan    1 non-ST elevation myocardial infarction-patient has ruled in for non-ST elevation myocardial infarction.  She is not having chest pain.  Continue aspirin, heparin and statin.  She is not on beta-blockade given bradycardia.  Echocardiogram shows normal LV function.  Plan is to proceed with cardiac catheterization tomorrow.  Spironolactone and Avapro are on hold in anticipation of  catheterization.  Will resume once procedure complete.    2 tachybradycardia syndrome-patient noted to be tachycardic in atrial fibrillation at time of admission.  She was given metoprolol with severe bradycardia.  Heart rate has improved.  Avoid AV nodal blocking agents.  Dr. Caryl Comes is evaluating and patient will likely need pacemaker.    3 paroxysmal atrial fibrillation-apixaban on hold until all procedures complete.  Tikosyn was discontinued at time of admission.  We will await input from Dr. Caryl Comes concerning reinitiating.  Avoid AV nodal blocking agents given bradycardia.    4 hypertension-blood pressure trending up.  Will resume Avapro and spironolactone following catheterization once renal function stable.  5 chronic stage IIIa kidney disease-diuretics on hold prior to catheterization.  Resume afterwards.  Follow renal function closely after catheterization.  6 hyperlipidemia-continue statin.  For questions or updates, please contact [place] Please consult [email or web address] for contact info under        Signed, Kirk Ruths, MD  05/05/2022, 8:35 AM

## 2022-05-05 NOTE — Consult Note (Signed)
ELECTROPHYSIOLOGY CONSULT NOTE  Patient ID: Barbara [name], MRN: [id number], DOB/AGE: 01-14-1941 81 y.o. Admit date: 05/03/2022 Date of Consult: 05/05/2022  Primary Physician: Laurey Morale, MD Primary Cardiologist: Leda Gauze Barbara [name] is a 81 y.o. female who is being seen today for the evaluation of tachybrady at the request of BCr.   Chief Complaint: ATRIAL FIB   HPI Barbara [name] is a 81 y.o. female admitted with awakening SSCP radiation >> L arm with + troponin; Rx with heparin with cath anticipated for NSTEMI Found in ER to have AFib with RVR, given metoprolol ( succinate) with subsequent conversion to junctional bradycardia Has been treated with afib in the past with dofetilide and has had recurrent problems with tachy brady having presented 12/21 again with atrial fibrillation and rapid rate.  At that time repeat troponin was greater than 700, it was felt that it was type II MI secondary to demand from rapid atrial fibrillation and she was not cathed.  Chronic modest dyspnea on exertion.       DATE TEST EF   4/20 LHC    % LADp-stent patent O/w nonobstructive CAD Near cavity obliteration   7/21 Myoview  No ischemia  7/21 cMRI 75 Apical HCM LGE 2%        Date Cr K Hgb  7/23 1.58 4.2 12.7<<11.8           Past Medical History:  Diagnosis Date   A-fib [facility])    Allergy    CAD (coronary artery disease)    sees Dr. Shelva Majestic  cardiac stents - 2000   CHF (congestive heart failure) ([place])    Colon polyps    Complication of anesthesia    rash/hives with "caines"   Dyspnea    02/12/18 " when my heart gets out of rhythm, it has not been out of rhythym- since I have been on Tikosyn (11/2017)   Dysrhythmia    afib fib   GERD (gastroesophageal reflux disease)    takes OTC- Omeprazole- prn   Heart murmur    History of stress test    show normal perfusion without scar or ischemia, post EF 68%   Hx of echocardiogram    show an EF 55%-60% range  with grade 1 diastolic dysfunction, she had mitral anular calcification with mild MR, moderate LA dilation and mild pulmonary hypertension with a PA estimated pressure of 72m   Hyperlipidemia    Hypertension    NSTEMI (non-ST elevated myocardial infarction) ([place]    Osteoarthritis    Pneumonia    hx of 2015    PONV (postoperative nausea and vomiting)     Surgical History:  Past Surgical History:  Procedure Laterality Date   CARDIAC CATHETERIZATION     11/2017   cardiac stents  2000   COLONOSCOPY  01-05-14   per Dr. JTeena Irani clear, no repeats needed    COLONOSCOPY     CORONARY STENT PLACEMENT  2000   in LAD   DIRECT LARYNGOSCOPY WITH RADIAESSE INJECTION N/A 02/13/2018   Procedure: DIRECT LARYNGOSCOPY WITH RADIAESSE INJECTION;  Surgeon: BMelida Quitter MD;  [place] [facility]  Service: ENT;  Laterality: N/A;   EYE SURGERY Left    CATARACT REMOVAL   KNEE ARTHROSCOPY Left 01/06/2015   Procedure: LEFT KNEE ARTHROSCOPY, abrasion chondroplasty of the medial femerol condryl,medial and lateral menisectomy, microfracture , synovectomy of the suprpatellar pouch;  Surgeon: RLatanya Maudlin MD;  Location: [facility];  Service: Orthopedics;  Laterality: Left;  LEFT HEART CATH AND CORONARY ANGIOGRAPHY N/A 02/26/2019   Procedure: LEFT HEART CATH AND CORONARY ANGIOGRAPHY;  Surgeon: Troy Sine, MD;  Location: [place] [facility];  Service: Cardiovascular;  Laterality: N/A;   MICROLARYNGOSCOPY W/VOCAL CORD INJECTION N/A 08/07/2018   Procedure: MICROLARYNGOSCOPY WITH VOCAL CORD INJECTION OF PROLARYN;  Surgeon: Melida Quitter, MD;  Location: [place];  Service: ENT;  Laterality: N/A;  JET VENTILATION   REVERSE SHOULDER ARTHROPLASTY Right 03/23/2020   Procedure: REVERSE SHOULDER ARTHROPLASTY;  Surgeon: Nicholes Stairs, MD;  Location: [place];  Service: Orthopedics;  Laterality: Right;   VAGINAL HYSTERECTOMY  1971     Home Meds: Prior to Admission medications   Medication Sig Start Date End Date Taking?  Authorizing Provider  acetaminophen (TYLENOL) 500 MG tablet Take 500 mg by mouth daily as needed for headache.   Yes [provider]  amLODipine (NORVASC) 5 MG tablet Take 1 tablet (5 mg total) by mouth daily. Patient taking differently: Take 5 mg by mouth every morning. 08/08/21  Yes Troy Sine, MD  amoxicillin (AMOXIL) 500 MG tablet Take 2,000 mg by mouth See admin instructions. Take 4 tablets (2000 mg) by mouth one hour prior to dental appointments 04/24/22  Yes [provider]  apixaban (ELIQUIS) 2.5 MG TABS tablet Take 1 tablet (2.5 mg total) by mouth 2 (two) times daily. 02/11/22  Yes Minus Breeding, MD  candesartan (ATACAND) 16 MG tablet Take 1 tablet (16 mg total) by mouth daily. Patient taking differently: Take 16 mg by mouth every morning. 03/18/22  Yes Troy Sine, MD  Cholecalciferol (VITAMIN D3) 2000 units TABS Take 2,000 Units by mouth daily.   Yes [provider]  dextromethorphan (DELSYM) 30 MG/5ML liquid Take 15 mg by mouth daily as needed for cough.   Yes [provider]  docusate sodium (COLACE) 100 MG capsule Take 1 capsule (100 mg total) by mouth 2 (two) times daily. Patient taking differently: Take 100 mg by mouth 2 (two) times daily as needed (constipation). 08/01/21  Yes Laurey Morale, MD  dofetilide (TIKOSYN) 125 MCG capsule Take 1 capsule (125 mcg total) by mouth 2 (two) times daily. 03/18/22  Yes Troy Sine, MD  ipratropium (ATROVENT) 0.06 % nasal spray Place 2 sprays into both nostrils 2 (two) times daily. 04/28/18  Yes [provider]  ketoconazole (NIZORAL) 2 % cream Apply 1 application topically 2 (two) times daily as needed for irritation. 06/13/21  Yes Laurey Morale, MD  ketotifen (ZADITOR) 0.025 % ophthalmic solution Place 1 drop into the right eye every morning.   Yes [provider]  Liniments (BLUE-EMU SUPER STRENGTH) CREA Apply 1 Application topically daily.   Yes [provider]  loratadine  (CLARITIN) 10 MG tablet Take 10 mg by mouth daily as needed (allergies/sinus issues).    Yes [provider]  LORazepam (ATIVAN) 0.5 MG tablet TAKE 1 TABLET BY MOUTH EVERY 6 HOURS AS NEEDED FOR ANXIETY Patient taking differently: Take 0.5 mg by mouth at bedtime. 04/23/22  Yes Laurey Morale, MD  Multiple Vitamin (MULTIVITAMIN WITH MINERALS) TABS tablet Take 1 tablet by mouth daily.   Yes [provider]  nitroGLYCERIN (NITROSTAT) 0.4 MG SL tablet DISSOLVE ONE TABLET UNDER THE TONGUE EVERY 5 MINUTES AS NEEDED FOR CHEST PAIN.  DO NOT EXCEED A TOTAL OF 3 DOSES IN 15 MINUTES Patient taking differently: Place 0.4 mg under the tongue every 5 (five) minutes as needed for chest pain. 06/21/21  Yes Claiborne Billings,  Joyice Faster, MD  pantoprazole (PROTONIX) 40 MG tablet Take 1 tablet (40 mg total) by mouth daily. Patient taking differently: Take 40 mg by mouth every morning. 01/03/22  Yes Troy Sine, MD  PRESCRIPTION MEDICATION See admin instructions. CPAP- At bedtime   Yes [provider]  Probiotic Product (PROBIOTIC GUMMIES PO) Take 2 tablets by mouth every morning.   Yes [provider]  rosuvastatin (CRESTOR) 40 MG tablet Take 1 tablet (40 mg total) by mouth daily. Patient taking differently: Take 40 mg by mouth at bedtime. 01/03/22  Yes Troy Sine, MD  spironolactone (ALDACTONE) 25 MG tablet Take 1/2 tablet daily on odd days next day on even days take 1/2 tablet twice a day Patient taking differently: Take 12.5 mg by mouth See admin instructions. Take 1/2 tablet (12.5 mg) by mouth in the morning on odd -numbered days;  take 1/2 tablet (12.5 mg) twice a day on even-numbered days 03/13/22  Yes Troy Sine, MD  triamcinolone (NASACORT) 55 MCG/ACT nasal inhaler Place 1 spray into both nostrils daily as needed (for allergies).  10/17/12  Yes Laurey Morale, MD    Inpatient Medications:   aspirin  81 mg Oral Daily   atropine  1 mg Intravenous Once   Chlorhexidine Gluconate  Cloth  6 each Topical Q0600   hydrALAZINE  50 mg Oral Q8H   isosorbide mononitrate  30 mg Oral Daily   pantoprazole  40 mg Oral Daily   rosuvastatin  40 mg Oral QHS      Allergies:  Allergies  Allergen Reactions   Lidocaine Anaphylaxis   Morphine Other (See Comments)    "Body shuts down"   Procaine Hcl Anaphylaxis, Rash and Other (See Comments)    "Anything with 'caine' in it "   Sulfonamide Derivatives Hives   Norco [Hydrocodone-Acetaminophen] Other (See Comments)    "Made me sick"   Tramadol Nausea Only   Vytorin [Ezetimibe-Simvastatin] Other (See Comments)    Unknown   Tape Itching and Other (See Comments)    Patient prefers either paper tape or Coban wrap    Social History   Socioeconomic History   Marital status: Married    Spouse name: Not on file   Number of children: 2   Years of education: Not on file   Highest education level: Not on file  Occupational History   Not on file  Tobacco Use   Smoking status: Former    Years: 40.00    Types: Cigarettes    Quit date: 11/04/1996    Years since quitting: 25.5   Smokeless tobacco: Never  Vaping Use   Vaping Use: Never used  Substance and Sexual Activity   Alcohol use: No    Alcohol/week: 0.0 standard drinks of alcohol   Drug use: No   Sexual activity: Not on file  Other Topics Concern   Not on file  Social History Narrative   Not on file   Social Determinants of Health   Financial Resource Strain: Low Risk  (06/06/2021)   Overall Financial Resource Strain (CARDIA)    Difficulty of Paying Living Expenses: Not hard at all  Food Insecurity: No Food Insecurity (06/06/2021)   Hunger Vital Sign    Worried About Running Out of Food in the Last Year: Never true    [place] in the Last Year: Never true  Transportation Needs: No Transportation Needs (06/06/2021)   PRAPARE - Hydrologist (Medical): No  Lack of Transportation (Non-Medical): No  Physical Activity: Inactive (06/06/2021)    Exercise Vital Sign    Days of Exercise per Week: 0 days    Minutes of Exercise per Session: 0 min  Stress: No Stress Concern Present (06/06/2021)   [place]    Feeling of Stress : Only a little  Social Connections: Moderately Integrated (06/06/2021)   Social Connection and Isolation Panel [NHANES]    Frequency of Communication with Friends and Family: More than three times a week    Frequency of Social Gatherings with Friends and Family: More than three times a week    Attends Religious Services: More than 4 times per year    Active Member of [employer] or Organizations: No    Attends Archivist Meetings: Never    Marital Status: Married  Human resources officer Violence: Not At Risk (06/06/2021)   Humiliation, Afraid, Rape, and Kick questionnaire    Fear of Current or Ex-Partner: No    Emotionally Abused: No    Physically Abused: No    Sexually Abused: No     Family History  Problem Relation Age of Onset   Cancer Maternal Grandmother    Heart disease Maternal Grandfather      ROS:  Please see the history of present illness.     All other systems reviewed and negative.    Physical Exam: Blood pressure (!) 161/59, pulse (!) 49, temperature 98.5 F (36.9 C), temperature source Axillary, resp. rate (!) 26, height '5\' 7"'$  (1.702 m), weight 79.7 kg, SpO2 98 %. General: Well developed, well nourished female in no acute distress. Head: Normocephalic, atraumatic, sclera non-icteric, no xanthomas, nares are without discharge. EENT: normal Lymph Nodes:  none Back: without scoliosis/kyphosis, no CVA tendersness Neck: Negative for carotid bruits. JVD not elevated. Lungs: Clear bilaterally to auscultation without wheezes, rales, or rhonchi. Breathing is unlabored. Heart: RRR with S1 S2. 2/6 murmur , rubs, or gallops appreciated. Abdomen: Soft, non-tender, non-distended with normoactive bowel sounds. No hepatomegaly. No  rebound/guarding. No obvious abdominal masses. Msk:  Strength and tone appear normal for age. Extremities: No clubbing or cyanosis. No  edema.  Distal pedal pulses are 2+ and equal bilaterally. Skin: Warm and Dry Neuro: Alert and oriented X 3. CN III-XII intact Grossly normal sensory and motor function . Psych:  Responds to questions appropriately with a normal affect.        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 Grade 4  I am not altogether convinced that this is a type I non-STEMI given her HCM and the rapid atrial fibrillation and the similar presentation about 18 months ago.  With her creatinine clearance, I would wonder about whether risk stratification with Myoview scanning might not be safe for then catheterization.  She clearly needs pacing and augmented AV nodal rate control so as to prevent these recurrent rapid atrial fibrillation events.  She also has junctional  rhythm and sinus node dysfunction so backup bradycardia pacing would help with that and as well as addressing chronotropic incompetence.  More aggressive therapy for hypertension  I have also reviewed with her re the genetic issues related to HCM.  She will need outpatient referral to Dr. Lattie Corns for genetic evaluation and proband testing for her family.    The challenge is timing given the anticipation of stenting>.  I will order Myoview scanning with the plan to proceed with pacing tomorrow afternoon.  If there is a high  risk lesion on Myoview scanning then the risk benefits of catheterization can be further clarified in the context of her renal insufficiency.     Barbara [name]

## 2022-05-05 NOTE — Progress Notes (Signed)
Progress Note  Patient Name: Barbara [name] Date of Encounter: 05/05/2022  [facility] Cardiologist: Shelva Majestic, MD   Subjective   Denies CP or dyspnea  Inpatient Medications    Scheduled Meds:  aspirin  81 mg Oral Daily   atropine  1 mg Intravenous Once   Chlorhexidine Gluconate Cloth  6 each Topical Q0600   hydrALAZINE  50 mg Oral Q8H   isosorbide mononitrate  30 mg Oral Daily   pantoprazole  40 mg Oral Daily   rosuvastatin  40 mg Oral QHS   Continuous Infusions:  heparin 850 Units/hr (05/05/22 0600)   PRN Meds: acetaminophen, docusate sodium, hydrALAZINE, lip balm, nitroGLYCERIN, ondansetron (ZOFRAN) IV, polyethylene glycol   Vital Signs    Vitals:   05/05/22 0300 05/05/22 0400 05/05/22 0500 05/05/22 0600  BP: (!) 148/54 (!) 139/52 (!) 142/51 (!) 161/59  Pulse: (!) 52 (!) 49 (!) 47 (!) 49  Resp:      Temp:      TempSrc:      SpO2: 96% 97% 98% 98%  Weight:   79.7 kg   Height:        Intake/Output Summary (Last 24 hours) at 05/05/2022 0835 Last data filed at 05/05/2022 0600 Gross per 24 hour  Intake 482.32 ml  Output 1500 ml  Net -1017.68 ml       05/05/2022    5:00 AM 05/03/2022    9:21 PM 05/03/2022    5:22 PM  Last 3 Weights  Weight (lbs) 175 lb 12.8 oz 181 lb 3.5 oz 175 lb  Weight (kg) 79.742 kg 82.2 kg 79.379 kg      Telemetry    Sinus bradycardia- Personally Reviewed  Physical Exam   GEN: NAD Neck: Supple Cardiac: RRR, 2/6 systolic murmur, no gallop Respiratory: CTA GI: Soft, NT/ND MS: No edema Neuro:  Grossly intact Psych: Normal affect   Labs    High Sensitivity Troponin:   Recent Labs  Lab 05/03/22 1206 05/03/22 1346 05/03/22 1545 05/03/22 1745 05/04/22 0009  TROPONINIHS 569* 801* 1,052* 1,115* 521*      Chemistry Recent Labs  Lab 05/03/22 1206 05/04/22 0318  NA 138 141  K 4.6 4.2  CL 109 106  CO2 23 23  GLUCOSE 151* 116*  BUN 28* 24*  CREATININE 1.33* 1.58*  CALCIUM 9.7 10.1  MG 2.2 2.0  PROT  --   6.5  ALBUMIN  --  3.7  AST  --  26  ALT  --  16  ALKPHOS  --  66  BILITOT  --  0.8  GFRNONAA 40* 32*  ANIONGAP 6 12      Hematology Recent Labs  Lab 05/03/22 1259 05/04/22 0318 05/05/22 0044  WBC 8.5 10.2 11.8*  RBC 4.21 4.01 4.28  HGB 12.3 11.8* 12.7  HCT 38.2 35.2* 37.3  MCV 90.7 87.8 87.1  MCH 29.2 29.4 29.7  MCHC 32.2 33.5 34.0  RDW 12.5 12.6 12.7  PLT 177 181 188     Radiology    ECHOCARDIOGRAM COMPLETE  Result Date: 05/04/2022    ECHOCARDIOGRAM REPORT   Patient Name:   Barbara [name] Date of Exam: 05/04/2022 Medical Rec #:  [id number]              Height:       67.0 in Accession #:    [id number]             Weight:       181.2 lb Date of Birth:  Mar 15, 1941              BSA:          1.939 m Patient Age:    81 years               BP:           133/41 mmHg Patient Gender: F                      HR:           45 bpm. Exam Location:  Inpatient Procedure: 2D Echo, Color Doppler, Cardiac Doppler and Intracardiac            Opacification Agent Indications:    R07.9* Chest pain, unspecified; 122-I22.9 Subsequent ST                 elevation (STEM) and non-ST elevation (NSTEMI) myocardial                 infarction  History:        Patient has prior history of Echocardiogram examinations, most                 recent 10/08/2020. CAD, Abnormal ECG, Arrythmias:Bradycardia and                 Atrial Fibrillation, Signs/Symptoms:Chest Pain and Dyspnea; Risk                 Factors:Dyslipidemia and Hypertension.  Sonographer:    Roseanna Rainbow RDCS Referring Phys: Margurite Auerbach  Sonographer Comments: Technically difficult study due to poor echo windows. Image acquisition challenging due to patient body habitus. IMPRESSIONS  1. Left ventricular ejection fraction, by estimation, is >75%. The left ventricle has hyperdynamic function. The left ventricle has no regional wall motion abnormalities. Left ventricular diastolic parameters are indeterminate. Elevated left atrial pressure.  2. Right  ventricular systolic function is normal. The right ventricular size is normal. There is mildly elevated pulmonary artery systolic pressure.  3. Left atrial size was moderately dilated.  4. The mitral valve is normal in structure. Moderate mitral valve regurgitation. No evidence of mitral stenosis.  5. The aortic valve is tricuspid. Aortic valve regurgitation is not visualized. Aortic valve sclerosis is present, with no evidence of aortic valve stenosis.  6. The inferior vena cava is normal in size with greater than 50% respiratory variability, suggesting right atrial pressure of 3 mmHg. FINDINGS  Left Ventricle: Left ventricular ejection fraction, by estimation, is >75%. The left ventricle has hyperdynamic function. The left ventricle has no regional wall motion abnormalities. Definity contrast agent was given IV to delineate the left ventricular endocardial borders. The left ventricular internal cavity size was normal in size. There is no left ventricular hypertrophy. Left ventricular diastolic parameters are indeterminate. Elevated left atrial pressure. Right Ventricle: The right ventricular size is normal. Right ventricular systolic function is normal. There is mildly elevated pulmonary artery systolic pressure. The tricuspid regurgitant velocity is 3.14 m/s, and with an assumed right atrial pressure of 3 mmHg, the estimated right ventricular systolic pressure is 95.0 mmHg. Left Atrium: Left atrial size was moderately dilated. Right Atrium: Right atrial size was normal in size. Pericardium: There is no evidence of pericardial effusion. Mitral Valve: The mitral valve is normal in structure. Moderate mitral valve regurgitation. No evidence of mitral valve stenosis. MV peak gradient, 8.6 mmHg. The mean mitral valve gradient is 2.0 mmHg. Tricuspid Valve: The tricuspid valve is normal in  structure. Tricuspid valve regurgitation is mild . No evidence of tricuspid stenosis. Aortic Valve: The aortic valve is tricuspid.  Aortic valve regurgitation is not visualized. Aortic valve sclerosis is present, with no evidence of aortic valve stenosis. Pulmonic Valve: The pulmonic valve was normal in structure. Pulmonic valve regurgitation is trivial. No evidence of pulmonic stenosis. Aorta: The aortic root is normal in size and structure. Venous: The inferior vena cava is normal in size with greater than 50% respiratory variability, suggesting right atrial pressure of 3 mmHg. IAS/Shunts: No atrial level shunt detected by color flow Doppler.  LEFT VENTRICLE PLAX 2D LVIDd:         4.80 cm      Diastology LVIDs:         2.70 cm      LV e' medial:    3.50 cm/s LV PW:         0.80 cm      LV E/e' medial:  40.3 LV IVS:        1.00 cm      LV e' lateral:   7.56 cm/s LVOT diam:     1.70 cm      LV E/e' lateral: 18.7 LV SV:         82 LV SV Index:   42 LVOT Area:     2.27 cm  LV Volumes (MOD) LV vol d, MOD A2C: 111.0 ml LV vol d, MOD A4C: 79.0 ml LV vol s, MOD A2C: 35.2 ml LV vol s, MOD A4C: 35.0 ml LV SV MOD A2C:     75.8 ml LV SV MOD A4C:     79.0 ml LV SV MOD BP:      59.2 ml RIGHT VENTRICLE             IVC RV S prime:     10.50 cm/s  IVC diam: 1.70 cm TAPSE (M-mode): 1.4 cm LEFT ATRIUM             Index        RIGHT ATRIUM           Index LA diam:        5.20 cm 2.68 cm/m   RA Area:     19.70 cm LA Vol (A2C):   79.9 ml 41.20 ml/m  RA Volume:   57.70 ml  29.75 ml/m LA Vol (A4C):   66.9 ml 34.50 ml/m LA Biplane Vol: 74.7 ml 38.52 ml/m  AORTIC VALVE LVOT Vmax:   156.00 cm/s LVOT Vmean:  96.000 cm/s LVOT VTI:    0.362 m  AORTA Ao Root diam: 2.50 cm Ao Asc diam:  3.00 cm MITRAL VALVE                  TRICUSPID VALVE MV Area (PHT): 2.87 cm       TR Peak grad:   39.4 mmHg MV Area VTI:   2.13 cm       TR Vmax:        314.00 cm/s MV Peak grad:  8.6 mmHg MV Mean grad:  2.0 mmHg       SHUNTS MV Vmax:       1.47 m/s       Systemic VTI:  0.36 m MV Vmean:      57.4 cm/s      Systemic Diam: 1.70 cm MV Decel Time: 264 msec MR Peak grad:    110.7 mmHg MR  Mean grad:    77.0 mmHg MR Vmax:  526.00 cm/s MR Vmean:        409.0 cm/s MR PISA:         1.01 cm MR PISA Eff ROA: 8 mm MR PISA Radius:  0.40 cm MV E velocity: 141.00 cm/s MV A velocity: 49.30 cm/s MV E/A ratio:  2.86 Kirk Ruths MD Electronically signed by Kirk Ruths MD Signature Date/Time: 05/04/2022/11:50:25 AM    Final    DG Chest 2 View  Result Date: 05/03/2022 CLINICAL DATA:  Chest pain/tightness and palpitations. History of atrial fibrillation. EXAM: CHEST - 2 VIEW COMPARISON:  Chest radiographs 10/07/2020 and earlier. Cardiac MRI 05/19/2020. FINDINGS: The cardiac silhouette is mildly enlarged. Rounded density posteriorly in the left lung base is chronic and is favored to reflect either a Bochdalek's hernia or focal eventration of the left hemidiaphragm and associated atelectasis. The lungs are clear elsewhere. No pleural effusion or pneumothorax is identified. A right shoulder arthroplasty is noted. IMPRESSION: No active cardiopulmonary disease. Electronically Signed   By: Logan Bores M.D.   On: 05/03/2022 12:02      Patient Profile     81 y.o. female with past medical history of paroxysmal atrial fibrillation, apical hypertrophic cardiomyopathy, coronary artery disease with history of PCI of the LAD, hypertension, hyperlipidemia, obstructive sleep apnea being evaluated for chest pain and bradycardia.  Cardiac MRI July 2021 showed LV apical hypertrophy.  Echocardiogram this admission shows vigorous LV function, moderate left atrial enlargement and moderate mitral regurgitation.  Assessment & Plan    1 non-ST elevation myocardial infarction-patient has ruled in for non-ST elevation myocardial infarction.  She is not having chest pain.  Continue aspirin, heparin and statin.  She is not on beta-blockade given bradycardia.  Echocardiogram shows normal LV function.  Plan is to proceed with cardiac catheterization tomorrow.  Spironolactone and Avapro are on hold in anticipation of  catheterization.  Will resume once procedure complete.    2 tachybradycardia syndrome-patient noted to be tachycardic in atrial fibrillation at time of admission.  She was given metoprolol with severe bradycardia.  Heart rate has improved.  Avoid AV nodal blocking agents.  Dr. Caryl Comes is evaluating and patient will likely need pacemaker.    3 paroxysmal atrial fibrillation-apixaban on hold until all procedures complete.  Tikosyn was discontinued at time of admission.  We will await input from Dr. Caryl Comes concerning reinitiating.  Avoid AV nodal blocking agents given bradycardia.    4 hypertension-blood pressure trending up.  Will resume Avapro and spironolactone following catheterization once renal function stable.  5 chronic stage IIIa kidney disease-diuretics on hold prior to catheterization.  Resume afterwards.  Follow renal function closely after catheterization.  6 hyperlipidemia-continue statin.  For questions or updates, please contact [place] Please consult [email or web address] for contact info under        Signed, Kirk Ruths, MD  05/05/2022, 8:35 AM

## 2022-05-05 NOTE — Progress Notes (Signed)
[place] for Heparin Indication: chest pain/ACS + afib  Allergies  Allergen Reactions   Lidocaine Anaphylaxis   Morphine Other (See Comments)    "Body shuts down"   Procaine Hcl Anaphylaxis, Rash and Other (See Comments)    "Anything with 'caine' in it "   Sulfonamide Derivatives Hives   Norco [Hydrocodone-Acetaminophen] Other (See Comments)    "Made me sick"   Tramadol Nausea Only   Vytorin [Ezetimibe-Simvastatin] Other (See Comments)    Unknown   Tape Itching and Other (See Comments)    Patient prefers either paper tape or Coban wrap    Patient Measurements: Height: '5\' 7"'$  (170.2 cm) Weight: 79.7 kg (175 lb 12.8 oz) IBW/kg (Calculated) : 61.6 Heparin Dosing Weight: 78kg  Vital Signs: BP: 134/46 (07/02 0900) Pulse Rate: 58 (07/02 0900)  Labs: Recent Labs     0000 05/03/22 1206 05/03/22 1259 05/03/22 1259 05/03/22 1346 05/03/22 1545 05/03/22 1745 05/03/22 1758 05/04/22 0009 05/04/22 0318 05/04/22 0946 05/05/22 0044 05/05/22 1054  HGB   < >  --  12.3  --   --   --   --   --   --  11.8*  --  12.7  --   HCT  --   --  38.2  --   --   --   --   --   --  35.2*  --  37.3  --   PLT  --   --  177  --   --   --   --   --   --  181  --  188  --   APTT  --   --   --    < >  --   --   --   --   --  80* 88* 143* 119*  HEPARINUNFRC  --   --   --   --   --   --   --  >1.10*  --   --   --  >1.10*  --   CREATININE  --  1.33*  --   --   --   --   --   --   --  1.58*  --   --   --   TROPONINIHS  --  569*  --   --    < > 1,052* 1,115*  --  521*  --   --   --   --    < > = values in this interval not displayed.     Estimated Creatinine Clearance: 29.3 mL/min (A) (by C-G formula based on SCr of 1.58 mg/dL (H)).   Assessment: 81 y.o. female admitted with chest pain and elevated troponin. Pt has hx AF on apixaban PTA, currently holding for LHC on IV heparin.  Repeat aPTT this afternoon remains elevated at 119 seconds. Confirmed lab was drawn  on opposite extremity from infusion site. No S/Sx bleeding documented.  Goal of Therapy:  aPTT 66-102 sec Heparin level 0.3-0.7 units/ml Monitor platelets by anticoagulation protocol: Yes   Plan:  Reduce heparin to 700 units/h Recheck aPTT in 8h Daily aPTT, heparin level, CBC  Arrie Senate, PharmD, [place], [facility] Clinical Pharmacist [phone] Please check AMION for all [place] numbers 05/05/2022

## 2022-05-05 NOTE — Progress Notes (Signed)
[place] for Heparin Indication: chest pain/ACS + afib Brief A/P: aPTT supratherapeutic Decrease Heparin rate  Allergies  Allergen Reactions   Lidocaine Anaphylaxis   Morphine Other (See Comments)    "Body shuts down"   Procaine Hcl Anaphylaxis, Rash and Other (See Comments)    "Anything with 'caine' in it "   Sulfonamide Derivatives Hives   Norco [Hydrocodone-Acetaminophen] Other (See Comments)    "Made me sick"   Tramadol Nausea Only   Vytorin [Ezetimibe-Simvastatin] Other (See Comments)    Unknown   Tape Itching and Other (See Comments)    Patient prefers either paper tape or Coban wrap    Patient Measurements: Height: '5\' 7"'$  (170.2 cm) Weight: 82.2 kg (181 lb 3.5 oz) IBW/kg (Calculated) : 61.6 Heparin Dosing Weight: 78kg  Vital Signs: Temp: 98.5 F (36.9 C) (07/02 0000) Temp Source: Axillary (07/02 0000) BP: 152/58 (07/02 0130) Pulse Rate: 63 (07/02 0130)  Labs: Recent Labs     0000 05/03/22 1206 05/03/22 1259 05/03/22 1346 05/03/22 1545 05/03/22 1745 05/03/22 1758 05/04/22 0009 05/04/22 0318 05/04/22 0946 05/05/22 0044  HGB   < >  --  12.3  --   --   --   --   --  11.8*  --  12.7  HCT  --   --  38.2  --   --   --   --   --  35.2*  --  37.3  PLT  --   --  177  --   --   --   --   --  181  --  188  APTT  --   --   --   --   --   --   --   --  80* 88* 143*  HEPARINUNFRC  --   --   --   --   --   --  >1.10*  --   --   --  >1.10*  CREATININE  --  1.33*  --   --   --   --   --   --  1.58*  --   --   TROPONINIHS  --  569*  --    < > 1,052* 1,115*  --  521*  --   --   --    < > = values in this interval not displayed.     Estimated Creatinine Clearance: 29.7 mL/min (A) (by C-G formula based on SCr of 1.58 mg/dL (H)).   Assessment: 81 y.o. female admitted with chest pain and elevated troponin, h/o Afib and Eliquis on hold, for heparin  Goal of Therapy:  aPTT 66-102 sec Heparin level 0.3-0.7 units/ml Monitor  platelets by anticoagulation protocol: Yes   Plan:  Decrease Heparin 850 units/hr Check aPTT in 8 hours   Falicia Lizotte, [place] Curb 05/05/2022,1:56 AM

## 2022-05-06 ENCOUNTER — Encounter (HOSPITAL_COMMUNITY): Payer: Self-pay | Admitting: Internal Medicine

## 2022-05-06 ENCOUNTER — Encounter (HOSPITAL_COMMUNITY)
Admission: EM | Disposition: A | Discharge status: Home Health | Payer: Self-pay | Source: Home / Self Care | Attending: Internal Medicine

## 2022-05-06 DIAGNOSIS — I219 Acute myocardial infarction, unspecified: Secondary | ICD-10-CM | POA: Diagnosis not present

## 2022-05-06 DIAGNOSIS — I200 Unstable angina: Secondary | ICD-10-CM | POA: Diagnosis not present

## 2022-05-06 DIAGNOSIS — I5042 Chronic combined systolic (congestive) and diastolic (congestive) heart failure: Secondary | ICD-10-CM

## 2022-05-06 DIAGNOSIS — I214 Non-ST elevation (NSTEMI) myocardial infarction: Secondary | ICD-10-CM | POA: Diagnosis not present

## 2022-05-06 DIAGNOSIS — R001 Bradycardia, unspecified: Secondary | ICD-10-CM | POA: Diagnosis not present

## 2022-05-06 DIAGNOSIS — I495 Sick sinus syndrome: Secondary | ICD-10-CM

## 2022-05-06 DIAGNOSIS — I480 Paroxysmal atrial fibrillation: Secondary | ICD-10-CM | POA: Diagnosis not present

## 2022-05-06 DIAGNOSIS — I2510 Atherosclerotic heart disease of native coronary artery without angina pectoris: Secondary | ICD-10-CM | POA: Diagnosis not present

## 2022-05-06 HISTORY — PX: LEFT HEART CATH AND CORONARY ANGIOGRAPHY: CATH118249

## 2022-05-06 LAB — CBC
HCT: 40.6 % (ref 36.0–46.0)
Hemoglobin: 13.1 g/dL (ref 12.0–15.0)
MCH: 28.4 pg (ref 26.0–34.0)
MCHC: 32.3 g/dL (ref 30.0–36.0)
MCV: 87.9 fL (ref 80.0–100.0)
Platelets: 200 10*3/uL (ref 150–400)
RBC: 4.62 MIL/uL (ref 3.87–5.11)
RDW: 12.6 % (ref 11.5–15.5)
WBC: 8.6 10*3/uL (ref 4.0–10.5)
nRBC: 0 % (ref 0.0–0.2)

## 2022-05-06 LAB — BASIC METABOLIC PANEL
Anion gap: 9 (ref 5–15)
BUN: 21 mg/dL (ref 8–23)
CO2: 20 mmol/L — ABNORMAL LOW (ref 22–32)
Calcium: 10 mg/dL (ref 8.9–10.3)
Chloride: 109 mmol/L (ref 98–111)
Creatinine, Ser: 1.54 mg/dL — ABNORMAL HIGH (ref 0.44–1.00)
GFR, Estimated: 33 mL/min — ABNORMAL LOW (ref >60)
Glucose, Bld: 103 mg/dL — ABNORMAL HIGH (ref 70–99)
Potassium: 3.8 mmol/L (ref 3.5–5.1)
Sodium: 138 mmol/L (ref 135–145)

## 2022-05-06 LAB — APTT
aPTT: 71 seconds — ABNORMAL HIGH (ref 24–36)
aPTT: 64 seconds — ABNORMAL HIGH (ref 24–36)

## 2022-05-06 LAB — HEPARIN LEVEL (UNFRACTIONATED): Heparin Unfractionated: 0.5 IU/mL (ref 0.30–0.70)

## 2022-05-06 LAB — MAGNESIUM: Magnesium: 1.8 mg/dL (ref 1.7–2.4)

## 2022-05-06 LAB — BRAIN NATRIURETIC PEPTIDE: B Natriuretic Peptide: 639.3 pg/mL — ABNORMAL HIGH (ref 0.0–100.0)

## 2022-05-06 SURGERY — LEFT HEART CATH AND CORONARY ANGIOGRAPHY
Anesthesia: LOCAL

## 2022-05-06 MED ORDER — HYDRALAZINE HCL 20 MG/ML IJ SOLN: 10.0000 mg | INTRAMUSCULAR | Status: AC | PRN

## 2022-05-06 MED ORDER — IOHEXOL 350 MG/ML SOLN
INTRAVENOUS | Status: DC | PRN
  Administered 2022-05-06: 20 mL

## 2022-05-06 MED ORDER — VERAPAMIL HCL 2.5 MG/ML IV SOLN
INTRAVENOUS | Status: DC | PRN
  Administered 2022-05-06: 10 mL via INTRA_ARTERIAL

## 2022-05-06 MED ORDER — HEPARIN (PORCINE) IN NACL 1000-0.9 UT/500ML-% IV SOLN
INTRAVENOUS | Status: DC | PRN
  Administered 2022-05-06 (×2): 500 mL

## 2022-05-06 MED ORDER — HEPARIN SODIUM (PORCINE) 1000 UNIT/ML IJ SOLN
INTRAMUSCULAR | Status: AC
  Filled 2022-05-06: qty 10

## 2022-05-06 MED ORDER — SODIUM CHLORIDE 0.9% FLUSH: 3.0000 mL | INTRAVENOUS | Status: DC | PRN

## 2022-05-06 MED ORDER — SODIUM CHLORIDE 0.9% FLUSH
3.0000 mL | Freq: Two times a day (BID) | INTRAVENOUS | Status: DC
  Administered 2022-05-06 – 2022-05-07 (×2): 3 mL via INTRAVENOUS

## 2022-05-06 MED ORDER — HEPARIN (PORCINE) IN NACL 1000-0.9 UT/500ML-% IV SOLN
INTRAVENOUS | Status: AC
  Filled 2022-05-06: qty 1000

## 2022-05-06 MED ORDER — HEPARIN SODIUM (PORCINE) 1000 UNIT/ML IJ SOLN
INTRAMUSCULAR | Status: DC | PRN
  Administered 2022-05-06: 4000 [IU] via INTRAVENOUS

## 2022-05-06 MED ORDER — SODIUM CHLORIDE 0.9% FLUSH
3.0000 mL | Freq: Two times a day (BID) | INTRAVENOUS | Status: DC
  Administered 2022-05-07: 3 mL via INTRAVENOUS

## 2022-05-06 MED ORDER — SODIUM CHLORIDE 0.9 % IV SOLN
250.0000 mL | INTRAVENOUS | Status: DC | PRN
Start: 2022-05-06 — End: 2022-05-10

## 2022-05-06 MED ORDER — VERAPAMIL HCL 2.5 MG/ML IV SOLN
INTRAVENOUS | Status: AC
  Filled 2022-05-06: qty 2

## 2022-05-06 MED ORDER — SODIUM CHLORIDE 0.9 % IV SOLN: INTRAVENOUS | Status: AC

## 2022-05-06 SURGICAL SUPPLY — 11 items
BAND CMPR LRG ZPHR (HEMOSTASIS) ×1
BAND ZEPHYR COMPRESS 30 LONG (HEMOSTASIS) ×1 IMPLANT
CATH 5FR JL3.5 JR4 ANG PIG MP (CATHETERS) ×1 IMPLANT
GLIDESHEATH SLEND SS 6F .021 (SHEATH) ×1 IMPLANT
GUIDEWIRE ANGLED .035X150CM (WIRE) ×1 IMPLANT
GUIDEWIRE INQWIRE 1.5J.035X260 (WIRE) IMPLANT
INQWIRE 1.5J .035X260CM (WIRE) ×2
KIT HEART LEFT (KITS) ×2 IMPLANT
PACK CARDIAC CATHETERIZATION (CUSTOM PROCEDURE TRAY) ×2 IMPLANT
TRANSDUCER W/STOPCOCK (MISCELLANEOUS) ×2 IMPLANT
TUBING CIL FLEX 10 FLL-RA (TUBING) ×2 IMPLANT

## 2022-05-06 NOTE — Progress Notes (Signed)
  Transition of Care [facility]) Screening Note   Patient Details  Name: Barbara [name] Date of Birth: [birth year]   Transition of Care [facility]) CM/SW Contact:    Milas Gain, [place] Phone Number: 05/06/2022, 5:11 PM    Transition of Care Department [facility]) has reviewed patient and no TOC needs have been identified at this time. We will continue to monitor patient advancement through interdisciplinary progression rounds. If new patient transition needs arise, please place a TOC consult.

## 2022-05-06 NOTE — Progress Notes (Signed)
[facility] for Heparin Indication: chest pain/ACS + afib Brief A/P: aPTT within goal range Continue Heparin at current rate   Allergies  Allergen Reactions   Lidocaine Anaphylaxis   Morphine Other (See Comments)    "Body shuts down"   Procaine Hcl Anaphylaxis, Rash and Other (See Comments)    "Anything with 'caine' in it "   Sulfonamide Derivatives Hives   Norco [Hydrocodone-Acetaminophen] Other (See Comments)    "Made me sick"   Tramadol Nausea Only   Vytorin [Ezetimibe-Simvastatin] Other (See Comments)    Unknown   Tape Itching and Other (See Comments)    Patient prefers either paper tape or Coban wrap    Patient Measurements: Height: '5\' 7"'$  (170.2 cm) Weight: 79.7 kg (175 lb 12.8 oz) IBW/kg (Calculated) : 61.6 Heparin Dosing Weight: 78kg  Vital Signs: Temp: 98.5 F (36.9 C) (07/02 2300) Temp Source: Oral (07/02 2300) BP: 157/54 (07/03 0300) Pulse Rate: 75 (07/03 0300)  Labs: Recent Labs     0000 05/03/22 1206 05/03/22 1259 05/03/22 1346 05/03/22 1545 05/03/22 1745 05/03/22 1758 05/04/22 0009 05/04/22 0318 05/04/22 0946 05/05/22 0044 05/05/22 1054 05/06/22 0227  HGB   < >  --  12.3  --   --   --   --   --  11.8*  --  12.7  --   --   HCT  --   --  38.2  --   --   --   --   --  35.2*  --  37.3  --   --   PLT  --   --  177  --   --   --   --   --  181  --  188  --   --   APTT  --   --   --   --   --   --   --   --  80*   < > 143* 119* 71*  HEPARINUNFRC  --   --   --   --   --   --  >1.10*  --   --   --  >1.10*  --   --   CREATININE  --  1.33*  --   --   --   --   --   --  1.58*  --   --   --   --   TROPONINIHS  --  569*  --    < > 1,052* 1,115*  --  521*  --   --   --   --   --    < > = values in this interval not displayed.     Estimated Creatinine Clearance: 29.3 mL/min (A) (by C-G formula based on SCr of 1.58 mg/dL (H)).   Assessment: 81 y.o. female admitted with chest pain and elevated troponin, h/o Afib and  Eliquis on hold, for heparin  Goal of Therapy:  aPTT 66-102 sec Heparin level 0.3-0.7 units/ml Monitor platelets by anticoagulation protocol: Yes   Plan:  Continue Heparin at current rate  Follow up after cath today   Caryl Pina 05/06/2022,3:04 AM

## 2022-05-06 NOTE — Progress Notes (Addendum)
Progress Note  Patient Name: Barbara [name] Date of Encounter: 05/06/2022  [facility] Cardiologist: Shelva Majestic, MD   Subjective   Feels well currently  Inpatient Medications    Scheduled Meds:  aspirin  81 mg Oral Daily   atropine  1 mg Intravenous Once   Chlorhexidine Gluconate Cloth  6 each Topical Q0600   gentamicin (GARAMYCIN) 80 mg in sodium chloride 0.9 % 500 mL irrigation  80 mg Irrigation On Call   hydrALAZINE  50 mg Oral Q8H   isosorbide mononitrate  30 mg Oral Daily   pantoprazole  40 mg Oral Daily   rosuvastatin  40 mg Oral QHS   sodium chloride flush  3 mL Intravenous Q12H   Continuous Infusions:  sodium chloride     sodium chloride     sodium chloride 50 mL/hr at 05/06/22 0512   sodium chloride 1 mL/kg/hr (05/06/22 0429)    ceFAZolin (ANCEF) IV     heparin 700 Units/hr (05/06/22 0300)   PRN Meds: sodium chloride, acetaminophen, docusate sodium, hydrALAZINE, lip balm, nitroGLYCERIN, ondansetron (ZOFRAN) IV, polyethylene glycol, sodium chloride flush   Vital Signs    Vitals:   05/06/22 0500 05/06/22 0605 05/06/22 0700 05/06/22 0756  BP:  (!) 142/54 (!) 146/55   Pulse:  75 71   Resp:  13 17   Temp:    98.2 F (36.8 C)  TempSrc:    Oral  SpO2:  95% 95%   Weight: 79.6 kg     Height:        Intake/Output Summary (Last 24 hours) at 05/06/2022 0824 Last data filed at 05/06/2022 0300 Gross per 24 hour  Intake 142.2 ml  Output 700 ml  Net -557.8 ml      05/06/2022    5:00 AM 05/05/2022    5:00 AM 05/03/2022    9:21 PM  Last 3 Weights  Weight (lbs) 175 lb 8 oz 175 lb 12.8 oz 181 lb 3.5 oz  Weight (kg) 79.606 kg 79.742 kg 82.2 kg      Telemetry    SR 70's, PACs - Personally Reviewed  ECG    No new EKGs - Personally Reviewed  Physical Exam   GEN: No acute distress.   Neck: No JVD Cardiac: RRR, no murmurs, rubs, or gallops.  Respiratory: CTA b/l. GI: Soft, nontender, non-distended  MS: No edema; No deformity. Neuro:  Nonfocal   Psych: Normal affect   Labs    High Sensitivity Troponin:   Recent Labs  Lab 05/03/22 1206 05/03/22 1346 05/03/22 1545 05/03/22 1745 05/04/22 0009  TROPONINIHS 569* 801* 1,052* 1,115* 521*     Chemistry Recent Labs  Lab 05/03/22 1206 05/04/22 0318 05/06/22 0629  NA 138 141 138  K 4.6 4.2 3.8  CL 109 106 109  CO2 23 23 20*  GLUCOSE 151* 116* 103*  BUN 28* 24* 21  CREATININE 1.33* 1.58* 1.54*  CALCIUM 9.7 10.1 10.0  MG 2.2 2.0 1.8  PROT  --  6.5  --   ALBUMIN  --  3.7  --   AST  --  26  --   ALT  --  16  --   ALKPHOS  --  66  --   BILITOT  --  0.8  --   GFRNONAA 40* 32* 33*  ANIONGAP '6 12 9    '$ Lipids No results for input(s): "CHOL", "TRIG", "HDL", "LABVLDL", "LDLCALC", "CHOLHDL" in the last 168 hours.  Hematology Recent Labs  Lab 05/04/22 [id number]  05/05/22 0044 05/06/22 0629  WBC 10.2 11.8* 8.6  RBC 4.01 4.28 4.62  HGB 11.8* 12.7 13.1  HCT 35.2* 37.3 40.6  MCV 87.8 87.1 87.9  MCH 29.4 29.7 28.4  MCHC 33.5 34.0 32.3  RDW 12.6 12.7 12.6  PLT 181 188 200   Thyroid No results for input(s): "TSH", "FREET4" in the last 168 hours.  BNP Recent Labs  Lab 05/06/22 0629  BNP 639.3*    DDimer No results for input(s): "DDIMER" in the last 168 hours.   Radiology      Cardiac Studies   05/04/22: TTE  1. Left ventricular ejection fraction, by estimation, is >75%. The left  ventricle has hyperdynamic function. The left ventricle has no regional  wall motion abnormalities. Left ventricular diastolic parameters are  indeterminate. Elevated left atrial  pressure.   2. Right ventricular systolic function is normal. The right ventricular  size is normal. There is mildly elevated pulmonary artery systolic  pressure.   3. Left atrial size was moderately dilated.   4. The mitral valve is normal in structure. Moderate mitral valve  regurgitation. No evidence of mitral stenosis.   5. The aortic valve is tricuspid. Aortic valve regurgitation is not  visualized. Aortic  valve sclerosis is present, with no evidence of aortic  valve stenosis.   6. The inferior vena cava is normal in size with greater than 50%  respiratory variability, suggesting right atrial pressure of 3 mmHg.   10/08/2020; TTE  1. Left ventricular ejection fraction, by estimation, is 70 to 75%. The  left ventricle has hyperdynamic function. The left ventricle has no  regional wall motion abnormalities. There is moderate eccentric left  ventricular hypertrophy of the apical  segment. Left ventricular diastolic parameters are indeterminate. Elevated  left ventricular end-diastolic pressure.   2. Right ventricular systolic function is normal. The right ventricular  size is normal. Tricuspid regurgitation signal is inadequate for assessing  PA pressure.   3. The mitral valve is abnormal. Mild mitral valve regurgitation.  Moderate mitral annular calcification.   4. The aortic valve is grossly normal. There is mild calcification of the  aortic valve. There is mild thickening of the aortic valve. Aortic valve  regurgitation is not visualized. Mild aortic valve sclerosis is present,  with no evidence of aortic valve  stenosis.   Comparison(s): No significant change from prior study.     02/26/2019: LHC Prox RCA to Mid RCA lesion is 15% stenosed. Ost LAD to Prox LAD lesion is 20% stenosed. Prox Cx lesion is 20% stenosed.   Mild non-obstructive coronary artery disease with a patent proximal LAD stent with mild 20% intimal hyperplasia (inserted  09/1999); 20% proximal left circumflex narrowing, and mild irregularity of 10 to 15% in the mid RCA.   Hyperdynamic LV function with an "Ace of Spade "configuration and near cavity obliteration in the mid to apical segment during systole.  There is evidence for left ventricular hypertrophy.  LVEDP is 16 mm.     Patient Profile      81 y.o. female w/PMHx of AFib, apical hypertrophic cardiomyopathy, coronary artery disease with history of PCI of the  LAD, hypertension, hyperlipidemia, OSA, CKD (III), initially admitted to [facility] with CP, w/concerns of NSTEMI got a dose of BB resulting in development of marked bradycardia > glucagon, atropine and isuprel with improvement  Planned for cath, though suspect 2/2 demand ischemia 2/2 RVR on presentation EP consult for tachy-brady  Assessment & Plan    Paroxysmal Afib  CHA2DS2Vasc is at least 5, on eliquis at home, held for procedures Home Tikosyn stopped on admission If we decide to go back to Tikosyn will need to be re-loaded.  Stop heparin gtt now (done) in anticipation of PPM  this afternoon  Tachy-brady Historically has had her nodal blocking agents stopped 2/2 bradycardia as well. Maintained on Tikosyn until this admission  Planned to proceed with pacer after her LHC today  3. CP 4. CAD Not an ongoing complaint HS Trop peaked at 1115 > last 521 Planned for LHC today to assess coronaries, plan likely is  if there is disease, would plan a staged intervention down the orad AFTER PPM pocket is healed up  5. Apical HCM Not felt to be volume OL Planned for out patient referral for genetics consult    For questions or updates, please contact [place] Please consult [email or web address] for contact info under        Signed, Baldwin Jamaica, PA-C  05/06/2022, 8:24 AM

## 2022-05-06 NOTE — Interval H&P Note (Signed)
History and Physical Interval Note:  05/06/2022 11:35 AM  Barbara [name]  has presented today for surgery, with the diagnosis of NSTEMI.  The various methods of treatment have been discussed with the patient and family. After consideration of risks, benefits and other options for treatment, the patient has consented to  Procedure(s): LEFT HEART CATH AND CORONARY ANGIOGRAPHY (N/A) as a surgical intervention.  The patient's history has been reviewed, patient examined, no change in status, stable for surgery.  I have reviewed the patient's chart and labs.  Questions were answered to the patient's satisfaction.    Cath Lab Visit (complete for each Cath Lab visit)  Clinical Evaluation Leading to the Procedure:   ACS: Yes.    Non-ACS:  N/A  Diamantina Edinger

## 2022-05-06 NOTE — Progress Notes (Signed)
Progress Note  Patient Name: Barbara [name] Date of Encounter: 05/06/2022  [facility] Cardiologist: Shelva Majestic, MD   Subjective   Denies angina.  Feels chilly, but has not had rigors or fever.  No other signs of infection. In sinus rhythm with a rate of in the 70s, PACs.  Inpatient Medications    Scheduled Meds:  aspirin  81 mg Oral Daily   atropine  1 mg Intravenous Once   Chlorhexidine Gluconate Cloth  6 each Topical Q0600   gentamicin (GARAMYCIN) 80 mg in sodium chloride 0.9 % 500 mL irrigation  80 mg Irrigation On Call   hydrALAZINE  50 mg Oral Q8H   isosorbide mononitrate  30 mg Oral Daily   pantoprazole  40 mg Oral Daily   rosuvastatin  40 mg Oral QHS   sodium chloride flush  3 mL Intravenous Q12H   Continuous Infusions:  sodium chloride     sodium chloride     sodium chloride 50 mL/hr at 05/06/22 0800   sodium chloride 1 mL/kg/hr (05/06/22 0800)    ceFAZolin (ANCEF) IV     heparin 700 Units/hr (05/06/22 0800)   PRN Meds: sodium chloride, acetaminophen, docusate sodium, hydrALAZINE, lip balm, nitroGLYCERIN, ondansetron (ZOFRAN) IV, polyethylene glycol, sodium chloride flush   Vital Signs    Vitals:   05/06/22 0605 05/06/22 0700 05/06/22 0756 05/06/22 0800  BP: (!) 142/54 (!) 146/55  (!) 145/61  Pulse: 75 71  76  Resp: '13 17  16  '$ Temp:   98.2 F (36.8 C)   TempSrc:   Oral   SpO2: 95% 95%  95%  Weight:      Height:        Intake/Output Summary (Last 24 hours) at 05/06/2022 0839 Last data filed at 05/06/2022 0800 Gross per 24 hour  Intake 856.03 ml  Output 700 ml  Net 156.03 ml      05/06/2022    5:00 AM 05/05/2022    5:00 AM 05/03/2022    9:21 PM  Last 3 Weights  Weight (lbs) 175 lb 8 oz 175 lb 12.8 oz 181 lb 3.5 oz  Weight (kg) 79.606 kg 79.742 kg 82.2 kg      Telemetry    Currently normal sinus rhythm with PACs- Personally Reviewed  ECG    Sinus rhythm with LVH- Personally Reviewed  Physical Exam  Appears comfortable lying  fully flat in bed GEN: No acute distress.   Neck: No JVD Cardiac: RRR, 2/6 early peaking systolic murmur, no diastolic murmurs, rubs, or gallops.  Respiratory: Clear to auscultation bilaterally. GI: Soft, nontender, non-distended  MS: No edema; No deformity. Neuro:  Nonfocal  Psych: Normal affect   Labs    High Sensitivity Troponin:   Recent Labs  Lab 05/03/22 1206 05/03/22 1346 05/03/22 1545 05/03/22 1745 05/04/22 0009  TROPONINIHS 569* 801* 1,052* 1,115* 521*     Chemistry Recent Labs  Lab 05/03/22 1206 05/04/22 0318 05/06/22 0629  NA 138 141 138  K 4.6 4.2 3.8  CL 109 106 109  CO2 23 23 20*  GLUCOSE 151* 116* 103*  BUN 28* 24* 21  CREATININE 1.33* 1.58* 1.54*  CALCIUM 9.7 10.1 10.0  MG 2.2 2.0 1.8  PROT  --  6.5  --   ALBUMIN  --  3.7  --   AST  --  26  --   ALT  --  16  --   ALKPHOS  --  66  --   BILITOT  --  0.8  --   GFRNONAA 40* 32* 33*  ANIONGAP '6 12 9    '$ Lipids No results for input(s): "CHOL", "TRIG", "HDL", "LABVLDL", "LDLCALC", "CHOLHDL" in the last 168 hours.  Hematology Recent Labs  Lab 05/04/22 0318 05/05/22 0044 05/06/22 0629  WBC 10.2 11.8* 8.6  RBC 4.01 4.28 4.62  HGB 11.8* 12.7 13.1  HCT 35.2* 37.3 40.6  MCV 87.8 87.1 87.9  MCH 29.4 29.7 28.4  MCHC 33.5 34.0 32.3  RDW 12.6 12.7 12.6  PLT 181 188 200   Thyroid No results for input(s): "TSH", "FREET4" in the last 168 hours.  BNP Recent Labs  Lab 05/06/22 0629  BNP 639.3*    DDimer No results for input(s): "DDIMER" in the last 168 hours.   Radiology    ECHOCARDIOGRAM COMPLETE  Result Date: 05/04/2022    ECHOCARDIOGRAM REPORT   Patient Name:   ASUZENA WEIS Date of Exam: 05/04/2022 Medical Rec #:  [id number]              Height:       67.0 in Accession #:    [id number]             Weight:       181.2 lb Date of Birth:  03-Jun-1941              BSA:          1.939 m Patient Age:    81 years               BP:           133/41 mmHg Patient Gender: F                      HR:            45 bpm. Exam Location:  Inpatient Procedure: 2D Echo, Color Doppler, Cardiac Doppler and Intracardiac            Opacification Agent Indications:    R07.9* Chest pain, unspecified; 122-I22.9 Subsequent ST                 elevation (STEM) and non-ST elevation (NSTEMI) myocardial                 infarction  History:        Patient has prior history of Echocardiogram examinations, most                 recent 10/08/2020. CAD, Abnormal ECG, Arrythmias:Bradycardia and                 Atrial Fibrillation, Signs/Symptoms:Chest Pain and Dyspnea; Risk                 Factors:Dyslipidemia and Hypertension.  Sonographer:    Roseanna Rainbow RDCS Referring Phys: Margurite Auerbach  Sonographer Comments: Technically difficult study due to poor echo windows. Image acquisition challenging due to patient body habitus. IMPRESSIONS  1. Left ventricular ejection fraction, by estimation, is >75%. The left ventricle has hyperdynamic function. The left ventricle has no regional wall motion abnormalities. Left ventricular diastolic parameters are indeterminate. Elevated left atrial pressure.  2. Right ventricular systolic function is normal. The right ventricular size is normal. There is mildly elevated pulmonary artery systolic pressure.  3. Left atrial size was moderately dilated.  4. The mitral valve is normal in structure. Moderate mitral valve regurgitation. No evidence of mitral stenosis.  5. The aortic valve is tricuspid. Aortic valve regurgitation  is not visualized. Aortic valve sclerosis is present, with no evidence of aortic valve stenosis.  6. The inferior vena cava is normal in size with greater than 50% respiratory variability, suggesting right atrial pressure of 3 mmHg. FINDINGS  Left Ventricle: Left ventricular ejection fraction, by estimation, is >75%. The left ventricle has hyperdynamic function. The left ventricle has no regional wall motion abnormalities. Definity contrast agent was given IV to delineate the left ventricular  endocardial borders. The left ventricular internal cavity size was normal in size. There is no left ventricular hypertrophy. Left ventricular diastolic parameters are indeterminate. Elevated left atrial pressure. Right Ventricle: The right ventricular size is normal. Right ventricular systolic function is normal. There is mildly elevated pulmonary artery systolic pressure. The tricuspid regurgitant velocity is 3.14 m/s, and with an assumed right atrial pressure of 3 mmHg, the estimated right ventricular systolic pressure is 41.9 mmHg. Left Atrium: Left atrial size was moderately dilated. Right Atrium: Right atrial size was normal in size. Pericardium: There is no evidence of pericardial effusion. Mitral Valve: The mitral valve is normal in structure. Moderate mitral valve regurgitation. No evidence of mitral valve stenosis. MV peak gradient, 8.6 mmHg. The mean mitral valve gradient is 2.0 mmHg. Tricuspid Valve: The tricuspid valve is normal in structure. Tricuspid valve regurgitation is mild . No evidence of tricuspid stenosis. Aortic Valve: The aortic valve is tricuspid. Aortic valve regurgitation is not visualized. Aortic valve sclerosis is present, with no evidence of aortic valve stenosis. Pulmonic Valve: The pulmonic valve was normal in structure. Pulmonic valve regurgitation is trivial. No evidence of pulmonic stenosis. Aorta: The aortic root is normal in size and structure. Venous: The inferior vena cava is normal in size with greater than 50% respiratory variability, suggesting right atrial pressure of 3 mmHg. IAS/Shunts: No atrial level shunt detected by color flow Doppler.  LEFT VENTRICLE PLAX 2D LVIDd:         4.80 cm      Diastology LVIDs:         2.70 cm      LV e' medial:    3.50 cm/s LV PW:         0.80 cm      LV E/e' medial:  40.3 LV IVS:        1.00 cm      LV e' lateral:   7.56 cm/s LVOT diam:     1.70 cm      LV E/e' lateral: 18.7 LV SV:         82 LV SV Index:   42 LVOT Area:     2.27 cm  LV  Volumes (MOD) LV vol d, MOD A2C: 111.0 ml LV vol d, MOD A4C: 79.0 ml LV vol s, MOD A2C: 35.2 ml LV vol s, MOD A4C: 35.0 ml LV SV MOD A2C:     75.8 ml LV SV MOD A4C:     79.0 ml LV SV MOD BP:      59.2 ml RIGHT VENTRICLE             IVC RV S prime:     10.50 cm/s  IVC diam: 1.70 cm TAPSE (M-mode): 1.4 cm LEFT ATRIUM             Index        RIGHT ATRIUM           Index LA diam:        5.20 cm 2.68 cm/m   RA Area:  19.70 cm LA Vol (A2C):   79.9 ml 41.20 ml/m  RA Volume:   57.70 ml  29.75 ml/m LA Vol (A4C):   66.9 ml 34.50 ml/m LA Biplane Vol: 74.7 ml 38.52 ml/m  AORTIC VALVE LVOT Vmax:   156.00 cm/s LVOT Vmean:  96.000 cm/s LVOT VTI:    0.362 m  AORTA Ao Root diam: 2.50 cm Ao Asc diam:  3.00 cm MITRAL VALVE                  TRICUSPID VALVE MV Area (PHT): 2.87 cm       TR Peak grad:   39.4 mmHg MV Area VTI:   2.13 cm       TR Vmax:        314.00 cm/s MV Peak grad:  8.6 mmHg MV Mean grad:  2.0 mmHg       SHUNTS MV Vmax:       1.47 m/s       Systemic VTI:  0.36 m MV Vmean:      57.4 cm/s      Systemic Diam: 1.70 cm MV Decel Time: 264 msec MR Peak grad:    110.7 mmHg MR Mean grad:    77.0 mmHg MR Vmax:         526.00 cm/s MR Vmean:        409.0 cm/s MR PISA:         1.01 cm MR PISA Eff ROA: 8 mm MR PISA Radius:  0.40 cm MV E velocity: 141.00 cm/s MV A velocity: 49.30 cm/s MV E/A ratio:  2.86 Kirk Ruths MD Electronically signed by Kirk Ruths MD Signature Date/Time: 05/04/2022/11:50:25 AM    Final     Cardiac Studies   Echocardiogram 05/04/2022  1. Left ventricular ejection fraction, by estimation, is >75%. The left ventricle has hyperdynamic function. The left ventricle has no regional wall motion abnormalities. Left ventricular diastolic parameters are indeterminate. Elevated left atrial pressure.   2. Right ventricular systolic function is normal. The right ventricular size is normal. There is mildly elevated pulmonary artery systolic pressure.   3. Left atrial size was moderately dilated.    4. The mitral valve is normal in structure. Moderate mitral valve regurgitation. No evidence of mitral stenosis.   5. The aortic valve is tricuspid. Aortic valve regurgitation is not visualized. Aortic valve sclerosis is present, with no evidence of aortic valve stenosis.   6. The inferior vena cava is normal in size with greater than 50% respiratory variability, suggesting right atrial pressure of 3 mmHg.  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  Assessment & Plan    NSTEMI: Unclear whether this is a type II myocardial infarction (peak troponin around 1100) due to increased demand or recurrent problems with CAD.  On aspirin, heparin, statin.  Plan for heart catheterization today.  If an offending lesion is identified, plan to perform a staged PCI in order to allow pacemaker implantation before she starts dual antiplatelet therapy. This procedure has been fully reviewed with the patient and written informed consent has been obtained. P A-fib/tachycardia-bradycardia syndrome: Currently normal sinus rhythm.  Has had repeated instances of severe bradycardia when treated for A-fib with RVR.  Plan for dual-chamber permanent  pacemaker later today, after heart catheterization. This procedure has been fully reviewed with the patient and written informed consent has been obtained. Acquired thrombophilia: Eliquis on hold to out catheterization today.  CHA2DS2-VASc 4-5 (age 59, HTN, CAD, +/- diastolic HF). 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  Creatinine today 1.5 (GFR 33) which appears to be her baseline. HLP: On statin.  Target LDL less than 70  (54 last August).  Pre-Diabetes with hemoglobin A1c 6.1%.  Mild hypertriglyceridemia was noted at that time.  Plan for catheterization/staged PCI and dual-chamber permanent pacemaker discussed with Dr. Jolyn Nap.  For questions or updates, please contact [place] Please consult [email or web address] for contact info under        Signed, Sanda Klein, MD  05/06/2022, 8:39 AM

## 2022-05-06 NOTE — Progress Notes (Signed)
PROGRESS NOTE                                                                                                                                                                                                             Patient Demographics:    Barbara [name], is a 81 y.o. female, DOB - 30-Apr-1941, HLK:[id number]  Outpatient Primary MD for the patient is Laurey Morale, MD    LOS - 3  Admit date - 05/03/2022    Chief Complaint  Patient presents with   Chest Pain   Palpitations       Brief Narrative (HPI from H&P)   81 year old female with pertinent PMH of HTN, HLD, CAD with stent of LAD and 2000, paroxysmal A-fib on Eliquis, OSA on nasal CPAP presents to [facility] on 6/30 with chest pain/shortness of breath, she was diagnosed with NSTEMI, she was seen by PCCM and cardiology placed on heparin drip.  Currently stable transferred to my care on 05/05/2022   Subjective:   Patient in bed, appears comfortable, denies any headache, no fever, no chest pain or pressure, no shortness of breath , no abdominal pain. No new focal weakness.   Assessment  & Plan :     NSTEMI with tachybradycardia syndrome.  Currently chest pain-free, on heparin drip, had received Toprol-XL causing some bradycardia after which she briefly required isoproterenol drip.  Currently heart rate better avoid rate lowering and nodal blocking agents.  Continue aspirin and statin for secondary prevention along with heparin drip.  Left heart cath to be done on 05/06/2022 per cardiology   2.  Tachybradycardia syndrome.  As in #1 above.  EP also following.  Avoid nodal blocking agents.  Likely will get pacemaker placed this admission.  3.  GERD.  On PPI.  4.  Essential hypertension.  Placed on combination of hydralazine and Imdur.  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  5.  Dyslipidemia.  On statin.  6.  CKD 3 a baseline creatinine close to 1.5.  Monitor.  Gentle IV fluids  while NPO.  7.  Mild nonspecific leukocytosis.  Afebrile, stable UA and chest x-ray.        Condition - Fair  Family Communication  : None present  Code Status :  Full  Consults  :  Cards, PCCM  PUD Prophylaxis : PPI  Procedures  :     TTE - 1. Left ventricular ejection fraction, by estimation, is >75%. The left ventricle has hyperdynamic function. The left ventricle has no regional wall motion abnormalities. Left ventricular diastolic parameters are indeterminate. Elevated left atrial pressure.  2. Right ventricular systolic function is normal. The right ventricular size is normal. There is mildly elevated pulmonary artery systolic pressure.  3. Left atrial size was moderately dilated.  4. The mitral valve is normal in structure. Moderate mitral valve regurgitation. No evidence of mitral stenosis.  5. The aortic valve is tricuspid. Aortic valve regurgitation is not visualized. Aortic valve sclerosis is present, with no evidence of aortic valve stenosis.  6. The inferior vena cava is normal in size with greater than 50% respiratory variability, suggesting right atrial pressure of 3 mmHg.      Disposition Plan  :    Status is: Inpatient  DVT Prophylaxis  :  Hep gtt  SCDs Start: 05/03/22 1602     Lab Results  Component Value Date   PLT 200 05/06/2022    Diet :  Diet Order             Diet NPO time specified Except for: Sips with Meds  Diet effective midnight                    Inpatient Medications  Scheduled Meds:  aspirin  81 mg Oral Daily   atropine  1 mg Intravenous Once   Chlorhexidine Gluconate Cloth  6 each Topical Q0600   gentamicin (GARAMYCIN) 80 mg in sodium chloride 0.9 % 500 mL irrigation  80 mg Irrigation On Call   hydrALAZINE  50 mg Oral Q8H   isosorbide mononitrate  30 mg Oral Daily   pantoprazole  40 mg Oral Daily   rosuvastatin  40 mg Oral QHS   sodium chloride flush  3 mL Intravenous Q12H   Continuous Infusions:  sodium chloride      sodium chloride     sodium chloride 50 mL/hr at 05/06/22 0800   sodium chloride 1 mL/kg/hr (05/06/22 0800)    ceFAZolin (ANCEF) IV     PRN Meds:.sodium chloride, acetaminophen, docusate sodium, hydrALAZINE, lip balm, nitroGLYCERIN, ondansetron (ZOFRAN) IV, polyethylene glycol, sodium chloride flush  Antibiotics  :    Anti-infectives (From admission, onward)    Start     Dose/Rate Route Frequency Ordered Stop   05/05/22 1200  gentamicin (GARAMYCIN) 80 mg in sodium chloride 0.9 % 500 mL irrigation        80 mg Irrigation On call 05/05/22 1110 05/06/22 1200   05/05/22 1200  ceFAZolin (ANCEF) IVPB 2g/100 mL premix        2 g 200 mL/hr over 30 Minutes Intravenous On call 05/05/22 1110 05/06/22 1200        Time Spent in minutes  30   Lala Lund M.D on 05/06/2022 at 9:36 AM  To page go to [email or web address]   [facility] -  Office  [phone]  See all Orders from today for further details    Objective:   Vitals:   05/06/22 0605 05/06/22 0700 05/06/22 0756 05/06/22 0800  BP: (!) 142/54 (!) 146/55  (!) 145/61  Pulse: 75 71  76  Resp: '13 17  16  '$ Temp:   98.2 F (36.8 C)   TempSrc:   Oral   SpO2: 95% 95%  95%  Weight:      Height:        Wt Readings  from Last 3 Encounters:  05/06/22 79.6 kg  03/13/22 80.3 kg  01/08/22 79.4 kg     Intake/Output Summary (Last 24 hours) at 05/06/2022 0936 Last data filed at 05/06/2022 0800 Gross per 24 hour  Intake 856.03 ml  Output 700 ml  Net 156.03 ml     Physical Exam  Awake Alert, No new F.N deficits, Normal affect [place].AT,PERRAL Supple Neck, No JVD,   Symmetrical Chest wall movement, Good air movement bilaterally, CTAB RRR,No Gallops, Rubs or new Murmurs,  +ve B.Sounds, Abd Soft, No tenderness,   No Cyanosis, Clubbing or edema      Data Review:    CBC Recent Labs  Lab 05/03/22 1259 05/04/22 0318 05/05/22 0044 05/06/22 0629  WBC 8.5 10.2 11.8* 8.6  HGB 12.3 11.8* 12.7 13.1  HCT 38.2 35.2* 37.3 40.6  PLT  177 181 188 200  MCV 90.7 87.8 87.1 87.9  MCH 29.2 29.4 29.7 28.4  MCHC 32.2 33.5 34.0 32.3  RDW 12.5 12.6 12.7 12.6  LYMPHSABS 1.0  --   --   --   MONOABS 0.5  --   --   --   EOSABS 0.0  --   --   --   BASOSABS 0.0  --   --   --     Electrolytes Recent Labs  Lab 05/03/22 1206 05/04/22 0318 05/06/22 0629  NA 138 141 138  K 4.6 4.2 3.8  CL 109 106 109  CO2 23 23 20*  GLUCOSE 151* 116* 103*  BUN 28* 24* 21  CREATININE 1.33* 1.58* 1.54*  CALCIUM 9.7 10.1 10.0  AST  --  26  --   ALT  --  16  --   ALKPHOS  --  66  --   BILITOT  --  0.8  --   ALBUMIN  --  3.7  --   MG 2.2 2.0 1.8  BNP  --   --  639.3*   Micro Results Recent Results (from the past 240 hour(s))  MRSA Next Gen by PCR, Nasal     Status: None   Collection Time: 05/03/22  9:19 PM   Specimen: Nasal Mucosa; Nasal Swab  Result Value Ref Range Status   MRSA by PCR Next Gen NOT DETECTED NOT DETECTED Final    Comment: (NOTE) The GeneXpert MRSA Assay (FDA approved for NASAL specimens only), is one component of a comprehensive MRSA colonization surveillance program. It is not intended to diagnose MRSA infection nor to guide or monitor treatment for MRSA infections. Test performance is not FDA approved in patients less than 89 years old. Performed at [facility], [place] [address], [place], [place] [address]   Surgical PCR screen     Status: None   Collection Time: 05/05/22  8:15 PM   Specimen: Nasal Mucosa; Nasal Swab  Result Value Ref Range Status   MRSA, PCR NEGATIVE NEGATIVE Final   Staphylococcus aureus NEGATIVE NEGATIVE Final    Comment: (NOTE) The Xpert SA Assay (FDA approved for NASAL specimens in patients 82 years of age and older), is one component of a comprehensive surveillance program. It is not intended to diagnose infection nor to guide or monitor treatment. Performed at [facility], [place] [address], [place], [place] [address]     Radiology Reports ECHOCARDIOGRAM  COMPLETE  Result Date: 05/04/2022    ECHOCARDIOGRAM REPORT   Patient Name:   RANYA FIDDLER Date of Exam: 05/04/2022 Medical Rec #:  [id number]  Height:       67.0 in Accession #:    [id number]             Weight:       181.2 lb Date of Birth:  [birth year]              BSA:          1.939 m Patient Age:    39 years               BP:           133/41 mmHg Patient Gender: F                      HR:           45 bpm. Exam Location:  Inpatient Procedure: 2D Echo, Color Doppler, Cardiac Doppler and Intracardiac            Opacification Agent Indications:    R07.9* Chest pain, unspecified; 122-I22.9 Subsequent ST                 elevation (STEM) and non-ST elevation (NSTEMI) myocardial                 infarction  History:        Patient has prior history of Echocardiogram examinations, most                 recent 10/08/2020. CAD, Abnormal ECG, Arrythmias:Bradycardia and                 Atrial Fibrillation, Signs/Symptoms:Chest Pain and Dyspnea; Risk                 Factors:Dyslipidemia and Hypertension.  Sonographer:    Roseanna Rainbow RDCS Referring Phys: Margurite Auerbach  Sonographer Comments: Technically difficult study due to poor echo windows. Image acquisition challenging due to patient body habitus. IMPRESSIONS  1. Left ventricular ejection fraction, by estimation, is >75%. The left ventricle has hyperdynamic function. The left ventricle has no regional wall motion abnormalities. Left ventricular diastolic parameters are indeterminate. Elevated left atrial pressure.  2. Right ventricular systolic function is normal. The right ventricular size is normal. There is mildly elevated pulmonary artery systolic pressure.  3. Left atrial size was moderately dilated.  4. The mitral valve is normal in structure. Moderate mitral valve regurgitation. No evidence of mitral stenosis.  5. The aortic valve is tricuspid. Aortic valve regurgitation is not visualized. Aortic valve sclerosis is present, with no evidence of  aortic valve stenosis.  6. The inferior vena cava is normal in size with greater than 50% respiratory variability, suggesting right atrial pressure of 3 mmHg. FINDINGS  Left Ventricle: Left ventricular ejection fraction, by estimation, is >75%. The left ventricle has hyperdynamic function. The left ventricle has no regional wall motion abnormalities. Definity contrast agent was given IV to delineate the left ventricular endocardial borders. The left ventricular internal cavity size was normal in size. There is no left ventricular hypertrophy. Left ventricular diastolic parameters are indeterminate. Elevated left atrial pressure. Right Ventricle: The right ventricular size is normal. Right ventricular systolic function is normal. There is mildly elevated pulmonary artery systolic pressure. The tricuspid regurgitant velocity is 3.14 m/s, and with an assumed right atrial pressure of 3 mmHg, the estimated right ventricular systolic pressure is 89.2 mmHg. Left Atrium: Left atrial size was moderately dilated. Right Atrium: Right atrial size was normal in size. Pericardium: There is no evidence of pericardial effusion.  Mitral Valve: The mitral valve is normal in structure. Moderate mitral valve regurgitation. No evidence of mitral valve stenosis. MV peak gradient, 8.6 mmHg. The mean mitral valve gradient is 2.0 mmHg. Tricuspid Valve: The tricuspid valve is normal in structure. Tricuspid valve regurgitation is mild . No evidence of tricuspid stenosis. Aortic Valve: The aortic valve is tricuspid. Aortic valve regurgitation is not visualized. Aortic valve sclerosis is present, with no evidence of aortic valve stenosis. Pulmonic Valve: The pulmonic valve was normal in structure. Pulmonic valve regurgitation is trivial. No evidence of pulmonic stenosis. Aorta: The aortic root is normal in size and structure. Venous: The inferior vena cava is normal in size with greater than 50% respiratory variability, suggesting right atrial  pressure of 3 mmHg. IAS/Shunts: No atrial level shunt detected by color flow Doppler.  LEFT VENTRICLE PLAX 2D LVIDd:         4.80 cm      Diastology LVIDs:         2.70 cm      LV e' medial:    3.50 cm/s LV PW:         0.80 cm      LV E/e' medial:  40.3 LV IVS:        1.00 cm      LV e' lateral:   7.56 cm/s LVOT diam:     1.70 cm      LV E/e' lateral: 18.7 LV SV:         82 LV SV Index:   42 LVOT Area:     2.27 cm  LV Volumes (MOD) LV vol d, MOD A2C: 111.0 ml LV vol d, MOD A4C: 79.0 ml LV vol s, MOD A2C: 35.2 ml LV vol s, MOD A4C: 35.0 ml LV SV MOD A2C:     75.8 ml LV SV MOD A4C:     79.0 ml LV SV MOD BP:      59.2 ml RIGHT VENTRICLE             IVC RV S prime:     10.50 cm/s  IVC diam: 1.70 cm TAPSE (M-mode): 1.4 cm LEFT ATRIUM             Index        RIGHT ATRIUM           Index LA diam:        5.20 cm 2.68 cm/m   RA Area:     19.70 cm LA Vol (A2C):   79.9 ml 41.20 ml/m  RA Volume:   57.70 ml  29.75 ml/m LA Vol (A4C):   66.9 ml 34.50 ml/m LA Biplane Vol: 74.7 ml 38.52 ml/m  AORTIC VALVE LVOT Vmax:   156.00 cm/s LVOT Vmean:  96.000 cm/s LVOT VTI:    0.362 m  AORTA Ao Root diam: 2.50 cm Ao Asc diam:  3.00 cm MITRAL VALVE                  TRICUSPID VALVE MV Area (PHT): 2.87 cm       TR Peak grad:   39.4 mmHg MV Area VTI:   2.13 cm       TR Vmax:        314.00 cm/s MV Peak grad:  8.6 mmHg MV Mean grad:  2.0 mmHg       SHUNTS MV Vmax:       1.47 m/s       Systemic VTI:  0.36 m MV Vmean:  57.4 cm/s      Systemic Diam: 1.70 cm MV Decel Time: 264 msec MR Peak grad:    110.7 mmHg MR Mean grad:    77.0 mmHg MR Vmax:         526.00 cm/s MR Vmean:        409.0 cm/s MR PISA:         1.01 cm MR PISA Eff ROA: 8 mm MR PISA Radius:  0.40 cm MV E velocity: 141.00 cm/s MV A velocity: 49.30 cm/s MV E/A ratio:  2.86 Kirk Ruths MD Electronically signed by Kirk Ruths MD Signature Date/Time: 05/04/2022/11:50:25 AM    Final    DG Chest 2 View  Result Date: 05/03/2022 CLINICAL DATA:  Chest pain/tightness and  palpitations. History of atrial fibrillation. EXAM: CHEST - 2 VIEW COMPARISON:  Chest radiographs 10/07/2020 and earlier. Cardiac MRI 05/19/2020. FINDINGS: The cardiac silhouette is mildly enlarged. Rounded density posteriorly in the left lung base is chronic and is favored to reflect either a Bochdalek's hernia or focal eventration of the left hemidiaphragm and associated atelectasis. The lungs are clear elsewhere. No pleural effusion or pneumothorax is identified. A right shoulder arthroplasty is noted. IMPRESSION: No active cardiopulmonary disease. Electronically Signed   By: Logan Bores M.D.   On: 05/03/2022 12:02

## 2022-05-07 DIAGNOSIS — R001 Bradycardia, unspecified: Secondary | ICD-10-CM | POA: Diagnosis not present

## 2022-05-07 DIAGNOSIS — I495 Sick sinus syndrome: Secondary | ICD-10-CM | POA: Diagnosis not present

## 2022-05-07 DIAGNOSIS — I480 Paroxysmal atrial fibrillation: Secondary | ICD-10-CM | POA: Diagnosis not present

## 2022-05-07 DIAGNOSIS — I214 Non-ST elevation (NSTEMI) myocardial infarction: Secondary | ICD-10-CM | POA: Diagnosis not present

## 2022-05-07 LAB — BASIC METABOLIC PANEL
Anion gap: 8 (ref 5–15)
BUN: 23 mg/dL (ref 8–23)
CO2: 23 mmol/L (ref 22–32)
Calcium: 9.7 mg/dL (ref 8.9–10.3)
Chloride: 109 mmol/L (ref 98–111)
Creatinine, Ser: 1.58 mg/dL — ABNORMAL HIGH (ref 0.44–1.00)
GFR, Estimated: 32 mL/min — ABNORMAL LOW (ref >60)
Glucose, Bld: 114 mg/dL — ABNORMAL HIGH (ref 70–99)
Potassium: 4.2 mmol/L (ref 3.5–5.1)
Sodium: 140 mmol/L (ref 135–145)

## 2022-05-07 LAB — CBC
HCT: 36 % (ref 36.0–46.0)
Hemoglobin: 12 g/dL (ref 12.0–15.0)
MCH: 29.6 pg (ref 26.0–34.0)
MCHC: 33.3 g/dL (ref 30.0–36.0)
MCV: 88.7 fL (ref 80.0–100.0)
Platelets: 183 10*3/uL (ref 150–400)
RBC: 4.06 MIL/uL (ref 3.87–5.11)
RDW: 12.8 % (ref 11.5–15.5)
WBC: 7.5 10*3/uL (ref 4.0–10.5)
nRBC: 0 % (ref 0.0–0.2)

## 2022-05-07 LAB — BRAIN NATRIURETIC PEPTIDE: B Natriuretic Peptide: 636.3 pg/mL — ABNORMAL HIGH (ref 0.0–100.0)

## 2022-05-07 LAB — URINE CULTURE

## 2022-05-07 LAB — MAGNESIUM: Magnesium: 1.7 mg/dL (ref 1.7–2.4)

## 2022-05-07 MED ORDER — FUROSEMIDE 40 MG PO TABS
40.0000 mg | ORAL_TABLET | Freq: Once | ORAL | Status: AC
  Administered 2022-05-07: 40 mg via ORAL
  Filled 2022-05-07: qty 1

## 2022-05-07 NOTE — Evaluation (Signed)
Physical Therapy Evaluation Patient Details Name: Barbara [name] MRN: [id number] DOB: 10-May-1941 Today's Date: 05/07/2022  History of Present Illness  Pt is a 81yo female admitted for CP and bradycardia. PMH: paroxysmal atrial fibrillation, apical hypertrophic cardiomyopathy, coronary artery disease with history of PCI of the LAD, hypertension, hyperlipidemia, obstructive sleep apnea  Per patient, patient is going for pacemaker 7/5.   Clinical Impression  Pt admitted with above. Pt lives alone however present with generalized deconditioning and requiring increased assist for transfers and ambulation. Pt reports not feeling comfortable going home alone at this time however is adamant about not going to a "nursing home" as that is where her husband passed away this past 12/04/2022. Per pt, pt is going for pacemaker placement tomorrow 7/5. PT to return as able s/p pacemaker placement to re-evaluate pt's mobility in hopes she feels better medically and improves functionally. Spoke with pt about starting to use a RW for energy conservation and improved stability with ambulation and to speak with family to see if they could stay with her for a short period of time while she gains her strength and endurance back to be safe at home alone. Acute PT to cont to follow.        Recommendations for follow up therapy are one component of a multi-disciplinary discharge planning process, led by the attending physician.  Recommendations may be updated based on patient status, additional functional criteria and insurance authorization.  Follow Up Recommendations Home health PT (re-evaluate s/p pacemaker)      Assistance Recommended at Discharge Frequent or constant Supervision/Assistance  Patient can return home with the following  A little help with walking and/or transfers;A little help with bathing/dressing/bathroom;Assist for transportation;Help with stairs or ramp for entrance    Equipment  Recommendations None recommended by PT (has DME)  Recommendations for Other Services       Functional Status Assessment Patient has had a recent decline in their functional status and demonstrates the ability to make significant improvements in function in a reasonable and predictable amount of time.     Precautions / Restrictions Precautions Precautions: Fall Precaution Comments: watch HR Restrictions Weight Bearing Restrictions: No      Mobility  Bed Mobility Overal bed mobility: Needs Assistance Bed Mobility: Supine to Sit, Sit to Supine     Supine to sit: Min assist Sit to supine: Min assist   General bed mobility comments: incresaed time, minA for trunk elevation, minA for bilat LEs back into the bed    Transfers Overall transfer level: Needs assistance Equipment used: 1 person hand held assist, Quad cane Transfers: Sit to/from Stand Sit to Stand: Mod assist, From elevated surface           General transfer comment: modA to power up from bed, increased time    Ambulation/Gait Ambulation/Gait assistance: Min guard Gait Distance (Feet): 30 Feet Assistive device: Straight cane Gait Pattern/deviations: Step-through pattern, Decreased stride length, Antalgic (reports bad knee) Gait velocity: dec Gait velocity interpretation: <1.31 ft/sec, indicative of household ambulator   General Gait Details: HR 85-115bpm, onset of DOE at end of amb, mildly unsteady, antalgic gait  Stairs            Wheelchair Mobility    Modified Rankin (Stroke Patients Only)       Balance Overall balance assessment: Needs assistance   Sitting balance-Leahy Scale: Fair     Standing balance support: Single extremity supported Standing balance-Leahy Scale: Poor Standing balance comment: dependent on UE support  Pertinent Vitals/Pain Pain Assessment Pain Assessment: No/denies pain    Home Living Family/patient expects to be  discharged to:: Private residence Living Arrangements: Alone Available Help at Discharge: Family;Available PRN/intermittently (family lives in [place]) Type of Home: House Home Access: Stairs to enter Entrance Stairs-Rails: None (HR on the front however pt enters from the back) Entrance Stairs-Number of Steps: 1   Home Layout: One level Home Equipment: [place] - single [employer] (2 wheels);Shower seat;BSC/3in1 (tall walker) Additional Comments: pt reports son, dtr, and cousin live in [place]    Prior Function Prior Level of Function : Independent/Modified Independent             Mobility Comments: amb with SPC in community, no AD in home, drives short distances, has someone take her to appts and groceries ADLs Comments: indep     Hand Dominance   Dominant Hand: Right    Extremity/Trunk Assessment   Upper Extremity Assessment Upper Extremity Assessment: Generalized weakness    Lower Extremity Assessment Lower Extremity Assessment: Generalized weakness    Cervical / Trunk Assessment Cervical / Trunk Assessment: Normal  Communication   Communication: No difficulties  Cognition Arousal/Alertness: Awake/alert Behavior During Therapy: WFL for tasks assessed/performed Overall Cognitive Status: Within Functional Limits for tasks assessed                                          General Comments General comments (skin integrity, edema, etc.): HR 85-115bpm, SpO2 at 96% on RA, DOE noted during amb    Exercises     Assessment/Plan    PT Assessment Patient needs continued PT services  PT Problem List Decreased strength;Decreased range of motion;Decreased activity tolerance;Decreased balance;Decreased mobility;Decreased coordination;Decreased knowledge of use of DME       PT Treatment Interventions DME instruction;Gait training;Stair training;Functional mobility training;Therapeutic activities;Therapeutic exercise;Balance training    PT Goals  (Current goals can be found in the Care Plan section)  Acute Rehab PT Goals Patient Stated Goal: go home and not to a nursing home PT Goal Formulation: With patient Time For Goal Achievement: 05/21/22 Potential to Achieve Goals: Good    Frequency Min 3X/week     Co-evaluation               AM-PAC PT "6 Clicks" Mobility  Outcome Measure Help needed turning from your back to your side while in a flat bed without using bedrails?: A Little Help needed moving from lying on your back to sitting on the side of a flat bed without using bedrails?: A Little Help needed moving to and from a bed to a chair (including a wheelchair)?: A Little Help needed standing up from a chair using your arms (e.g., wheelchair or bedside chair)?: A Lot Help needed to walk in hospital room?: A Little Help needed climbing 3-5 steps with a railing? : A Lot 6 Click Score: 16    End of Session Equipment Utilized During Treatment: Gait belt Activity Tolerance: Patient tolerated treatment well Patient left: in bed;with call bell/phone within reach (pt just resturned from chair) Nurse Communication: Mobility status PT Visit Diagnosis: Unsteadiness on feet (R26.81);Muscle weakness (generalized) (M62.81);Difficulty in walking, not elsewhere classified (R26.2)    Time: 4709-6283 PT Time Calculation (min) (ACUTE ONLY): 23 min   Charges:   PT Evaluation $PT Eval Moderate Complexity: 1 Mod PT Treatments $Gait Training: 8-22 mins  Kittie Plater, PT, DPT Acute Rehabilitation Services Secure chat preferred Office #: [phone]   Berline Lopes 05/07/2022, 10:21 AM

## 2022-05-07 NOTE — Progress Notes (Signed)
PROGRESS NOTE                                                                                                                                                                                                             Patient Demographics:    Barbara [name], is a 81 y.o. female, DOB - [birth year], BBC:[id number]  Outpatient Primary MD for the patient is Laurey Morale, MD    LOS - 4  Admit date - 05/03/2022    Chief Complaint  Patient presents with   Chest Pain   Palpitations       Brief Narrative (HPI from H&P)   81 year old female with pertinent PMH of HTN, HLD, CAD with stent of LAD and 2000, paroxysmal A-fib on Eliquis, OSA on nasal CPAP presents to [facility] on 6/30 with chest pain/shortness of breath, she was diagnosed with NSTEMI, she was seen by PCCM and cardiology placed on heparin drip.  Currently stable transferred to my care on 05/05/2022   Subjective:   Patient in bed, appears comfortable, denies any headache, no fever, no chest pain or pressure, no shortness of breath , no abdominal pain. No new focal weakness.   Assessment  & Plan :     NSTEMI with tachybradycardia syndrome.  Currently chest pain-free, on heparin drip, had received Toprol-XL causing some bradycardia after which she briefly required isoproterenol drip.  She was seen by cardiology underwent left heart cath which showed nonobstructing CAD, currently on medical management with combination of aspirin, statin, hydralazine and Imdur.  Pain-free.  Beta-blocker being avoided due to tachybradycardia syndrome.   2.  Tachybradycardia syndrome.  As in #1 above.  EP also following.  Avoid nodal blocking agents.  Likely will get pacemaker placed ton 05/08/22.  3.  GERD.  On PPI.  4.  Essential hypertension.  Placed on combination of hydralazine and Imdur.  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  5.  Dyslipidemia.  On statin.  6.  CKD 3 a baseline creatinine close  to 1.5.  Monitor.  Gentle IV fluids while NPO.  7.  Mild nonspecific leukocytosis.  Afebrile, stable UA and chest x-ray.        Condition - Fair  Family Communication  : None present  Code Status :  Full  Consults  :  Cards, PCCM  PUD Prophylaxis : PPI  Procedures  :     TTE - 1. Left ventricular ejection fraction, by estimation, is >75%. The left ventricle has hyperdynamic function. The left ventricle has no regional wall motion abnormalities. Left ventricular diastolic parameters are indeterminate. Elevated left atrial pressure.  2. Right ventricular systolic function is normal. The right ventricular size is normal. There is mildly elevated pulmonary artery systolic pressure.  3. Left atrial size was moderately dilated.  4. The mitral valve is normal in structure. Moderate mitral valve regurgitation. No evidence of mitral stenosis.  5. The aortic valve is tricuspid. Aortic valve regurgitation is not visualized. Aortic valve sclerosis is present, with no evidence of aortic valve stenosis.  6. The inferior vena cava is normal in size with greater than 50% respiratory variability, suggesting right atrial pressure of 3 mmHg.      Disposition Plan  :    Status is: Inpatient  DVT Prophylaxis  :  Hep gtt  SCD's Start: 05/06/22 1210 SCDs Start: 05/03/22 1602     Lab Results  Component Value Date   PLT 183 05/07/2022    Diet :  Diet Order             Diet NPO time specified Except for: Sips with Meds  Diet effective now           Diet Heart Room service appropriate? Yes; Fluid consistency: Thin  Diet effective now                    Inpatient Medications  Scheduled Meds:  aspirin  81 mg Oral Daily   atropine  1 mg Intravenous Once   Chlorhexidine Gluconate Cloth  6 each Topical Q0600   furosemide  40 mg Oral Once   hydrALAZINE  50 mg Oral Q8H   isosorbide mononitrate  30 mg Oral Daily   pantoprazole  40 mg Oral Daily   rosuvastatin  40 mg Oral QHS   sodium  chloride flush  3 mL Intravenous Q12H   Continuous Infusions:  sodium chloride     PRN Meds:.sodium chloride, acetaminophen, docusate sodium, hydrALAZINE, lip balm, nitroGLYCERIN, ondansetron (ZOFRAN) IV, polyethylene glycol  Antibiotics  :    Anti-infectives (From admission, onward)    Start     Dose/Rate Route Frequency Ordered Stop   05/05/22 1200  gentamicin (GARAMYCIN) 80 mg in sodium chloride 0.9 % 500 mL irrigation        80 mg Irrigation On call 05/05/22 1110 05/06/22 1200   05/05/22 1200  ceFAZolin (ANCEF) IVPB 2g/100 mL premix        2 g 200 mL/hr over 30 Minutes Intravenous On call 05/05/22 1110 05/06/22 1200        Time Spent in minutes  30   Lala Lund M.D on 05/07/2022 at 11:01 AM  To page go to [email or web address]   [facility] -  Office  [phone]  See all Orders from today for further details    Objective:   Vitals:   05/07/22 0344 05/07/22 0400 05/07/22 0500 05/07/22 0747  BP:  128/62 (!) 142/52   Pulse:  81 81   Resp:  (!) 22 (!) 23   Temp: 97.6 F (36.4 C)   98.1 F (36.7 C)  TempSrc: Axillary   Oral  SpO2:  94% 95%   Weight:   80.2 kg   Height:        Wt Readings from Last 3 Encounters:  05/07/22 80.2 kg  03/13/22 80.3 kg  01/08/22 79.4  kg     Intake/Output Summary (Last 24 hours) at 05/07/2022 1101 Last data filed at 05/07/2022 0500 Gross per 24 hour  Intake --  Output 900 ml  Net -900 ml     Physical Exam  Awake Alert, No new F.N deficits, Normal affect [place].AT,PERRAL Supple Neck, No JVD,   Symmetrical Chest wall movement, Good air movement bilaterally, CTAB RRR,No Gallops, Rubs or new Murmurs,  +ve B.Sounds, Abd Soft, No tenderness,   No Cyanosis, Clubbing or edema      Data Review:    CBC Recent Labs  Lab 05/03/22 1259 05/04/22 0318 05/05/22 0044 05/06/22 0629 05/07/22 0101  WBC 8.5 10.2 11.8* 8.6 7.5  HGB 12.3 11.8* 12.7 13.1 12.0  HCT 38.2 35.2* 37.3 40.6 36.0  PLT 177 181 188 200 183  MCV 90.7 87.8  87.1 87.9 88.7  MCH 29.2 29.4 29.7 28.4 29.6  MCHC 32.2 33.5 34.0 32.3 33.3  RDW 12.5 12.6 12.7 12.6 12.8  LYMPHSABS 1.0  --   --   --   --   MONOABS 0.5  --   --   --   --   EOSABS 0.0  --   --   --   --   BASOSABS 0.0  --   --   --   --     Electrolytes Recent Labs  Lab 05/03/22 1206 05/04/22 0318 05/06/22 0629 05/07/22 0101  NA 138 141 138 140  K 4.6 4.2 3.8 4.2  CL 109 106 109 109  CO2 23 23 20* 23  GLUCOSE 151* 116* 103* 114*  BUN 28* 24* 21 23  CREATININE 1.33* 1.58* 1.54* 1.58*  CALCIUM 9.7 10.1 10.0 9.7  AST  --  26  --   --   ALT  --  16  --   --   ALKPHOS  --  66  --   --   BILITOT  --  0.8  --   --   ALBUMIN  --  3.7  --   --   MG 2.2 2.0 1.8 1.7  BNP  --   --  639.3* 636.3*   Micro Results Recent Results (from the past 240 hour(s))  MRSA Next Gen by PCR, Nasal     Status: None   Collection Time: 05/03/22  9:19 PM   Specimen: Nasal Mucosa; Nasal Swab  Result Value Ref Range Status   MRSA by PCR Next Gen NOT DETECTED NOT DETECTED Final    Comment: (NOTE) The GeneXpert MRSA Assay (FDA approved for NASAL specimens only), is one component of a comprehensive MRSA colonization surveillance program. It is not intended to diagnose MRSA infection nor to guide or monitor treatment for MRSA infections. Test performance is not FDA approved in patients less than 8 years old. Performed at [facility], [place] [address] [place], [place] [address]   Surgical PCR screen     Status: None   Collection Time: 05/05/22  8:15 PM   Specimen: Nasal Mucosa; Nasal Swab  Result Value Ref Range Status   MRSA, PCR NEGATIVE NEGATIVE Final   Staphylococcus aureus NEGATIVE NEGATIVE Final    Comment: (NOTE) The Xpert SA Assay (FDA approved for NASAL specimens in patients 36 years of age and older), is one component of a comprehensive surveillance program. It is not intended to diagnose infection nor to guide or monitor treatment. Performed at [facility], [place] [address], [place], [place] [address]   Urine Culture  Status: Abnormal   Collection Time: 05/05/22  8:23 PM   Specimen: Urine, Clean Catch  Result Value Ref Range Status   Specimen Description URINE, CLEAN CATCH  Final   Special Requests   Final    NONE Performed at [facility], [place] [address] [place], [place] [address]    Culture MULTIPLE SPECIES PRESENT, SUGGEST RECOLLECTION (A)  Final   Report Status 05/07/2022 FINAL  Final    Radiology Reports CARDIAC CATHETERIZATION  Result Date: 05/06/2022 Conclusions: Mild-moderate, non-obstructive coronary artery disease. Patent proximal/mid LAD stent with up to 30% in-stent restenosis. Low left ventricular filling pressure (LVEDP ~5 mmHg). Recommendations: Continue secondary prevention of coronary artery disease. Proceed with pacemaker placement per Dr. Caryl Comes. Nelva Bush, MD [facility]  ECHOCARDIOGRAM COMPLETE  Result Date: 05/04/2022    ECHOCARDIOGRAM REPORT   Patient Name:   REBEL LAUGHRIDGE Date of Exam: 05/04/2022 Medical Rec #:  [id number]              Height:       67.0 in Accession #:    [id number]             Weight:       181.2 lb Date of Birth:  12/20/1940              BSA:          1.939 m Patient Age:    80 years               BP:           133/41 mmHg Patient Gender: F                      HR:           45 bpm. Exam Location:  Inpatient Procedure: 2D Echo, Color Doppler, Cardiac Doppler and Intracardiac            Opacification Agent Indications:    R07.9* Chest pain, unspecified; 122-I22.9 Subsequent ST                 elevation (STEM) and non-ST elevation (NSTEMI) myocardial                 infarction  History:        Patient has prior history of Echocardiogram examinations, most                 recent 10/08/2020. CAD, Abnormal ECG, Arrythmias:Bradycardia and                 Atrial Fibrillation, Signs/Symptoms:Chest Pain and Dyspnea; Risk                 Factors:Dyslipidemia and Hypertension.  Sonographer:    Roseanna Rainbow  RDCS Referring Phys: Margurite Auerbach  Sonographer Comments: Technically difficult study due to poor echo windows. Image acquisition challenging due to patient body habitus. IMPRESSIONS  1. Left ventricular ejection fraction, by estimation, is >75%. The left ventricle has hyperdynamic function. The left ventricle has no regional wall motion abnormalities. Left ventricular diastolic parameters are indeterminate. Elevated left atrial pressure.  2. Right ventricular systolic function is normal. The right ventricular size is normal. There is mildly elevated pulmonary artery systolic pressure.  3. Left atrial size was moderately dilated.  4. The mitral valve is normal in structure. Moderate mitral valve regurgitation. No evidence of mitral stenosis.  5. The aortic valve is tricuspid. Aortic valve regurgitation is not visualized. Aortic  valve sclerosis is present, with no evidence of aortic valve stenosis.  6. The inferior vena cava is normal in size with greater than 50% respiratory variability, suggesting right atrial pressure of 3 mmHg. FINDINGS  Left Ventricle: Left ventricular ejection fraction, by estimation, is >75%. The left ventricle has hyperdynamic function. The left ventricle has no regional wall motion abnormalities. Definity contrast agent was given IV to delineate the left ventricular endocardial borders. The left ventricular internal cavity size was normal in size. There is no left ventricular hypertrophy. Left ventricular diastolic parameters are indeterminate. Elevated left atrial pressure. Right Ventricle: The right ventricular size is normal. Right ventricular systolic function is normal. There is mildly elevated pulmonary artery systolic pressure. The tricuspid regurgitant velocity is 3.14 m/s, and with an assumed right atrial pressure of 3 mmHg, the estimated right ventricular systolic pressure is 83.4 mmHg. Left Atrium: Left atrial size was moderately dilated. Right Atrium: Right atrial size was  normal in size. Pericardium: There is no evidence of pericardial effusion. Mitral Valve: The mitral valve is normal in structure. Moderate mitral valve regurgitation. No evidence of mitral valve stenosis. MV peak gradient, 8.6 mmHg. The mean mitral valve gradient is 2.0 mmHg. Tricuspid Valve: The tricuspid valve is normal in structure. Tricuspid valve regurgitation is mild . No evidence of tricuspid stenosis. Aortic Valve: The aortic valve is tricuspid. Aortic valve regurgitation is not visualized. Aortic valve sclerosis is present, with no evidence of aortic valve stenosis. Pulmonic Valve: The pulmonic valve was normal in structure. Pulmonic valve regurgitation is trivial. No evidence of pulmonic stenosis. Aorta: The aortic root is normal in size and structure. Venous: The inferior vena cava is normal in size with greater than 50% respiratory variability, suggesting right atrial pressure of 3 mmHg. IAS/Shunts: No atrial level shunt detected by color flow Doppler.  LEFT VENTRICLE PLAX 2D LVIDd:         4.80 cm      Diastology LVIDs:         2.70 cm      LV e' medial:    3.50 cm/s LV PW:         0.80 cm      LV E/e' medial:  40.3 LV IVS:        1.00 cm      LV e' lateral:   7.56 cm/s LVOT diam:     1.70 cm      LV E/e' lateral: 18.7 LV SV:         82 LV SV Index:   42 LVOT Area:     2.27 cm  LV Volumes (MOD) LV vol d, MOD A2C: 111.0 ml LV vol d, MOD A4C: 79.0 ml LV vol s, MOD A2C: 35.2 ml LV vol s, MOD A4C: 35.0 ml LV SV MOD A2C:     75.8 ml LV SV MOD A4C:     79.0 ml LV SV MOD BP:      59.2 ml RIGHT VENTRICLE             IVC RV S prime:     10.50 cm/s  IVC diam: 1.70 cm TAPSE (M-mode): 1.4 cm LEFT ATRIUM             Index        RIGHT ATRIUM           Index LA diam:        5.20 cm 2.68 cm/m   RA Area:     19.70 cm LA  Vol (A2C):   79.9 ml 41.20 ml/m  RA Volume:   57.70 ml  29.75 ml/m LA Vol (A4C):   66.9 ml 34.50 ml/m LA Biplane Vol: 74.7 ml 38.52 ml/m  AORTIC VALVE LVOT Vmax:   156.00 cm/s LVOT Vmean:  96.000  cm/s LVOT VTI:    0.362 m  AORTA Ao Root diam: 2.50 cm Ao Asc diam:  3.00 cm MITRAL VALVE                  TRICUSPID VALVE MV Area (PHT): 2.87 cm       TR Peak grad:   39.4 mmHg MV Area VTI:   2.13 cm       TR Vmax:        314.00 cm/s MV Peak grad:  8.6 mmHg MV Mean grad:  2.0 mmHg       SHUNTS MV Vmax:       1.47 m/s       Systemic VTI:  0.36 m MV Vmean:      57.4 cm/s      Systemic Diam: 1.70 cm MV Decel Time: 264 msec MR Peak grad:    110.7 mmHg MR Mean grad:    77.0 mmHg MR Vmax:         526.00 cm/s MR Vmean:        409.0 cm/s MR PISA:         1.01 cm MR PISA Eff ROA: 8 mm MR PISA Radius:  0.40 cm MV E velocity: 141.00 cm/s MV A velocity: 49.30 cm/s MV E/A ratio:  2.86 Kirk Ruths MD Electronically signed by Kirk Ruths MD Signature Date/Time: 05/04/2022/11:50:25 AM    Final    DG Chest 2 View  Result Date: 05/03/2022 CLINICAL DATA:  Chest pain/tightness and palpitations. History of atrial fibrillation. EXAM: CHEST - 2 VIEW COMPARISON:  Chest radiographs 10/07/2020 and earlier. Cardiac MRI 05/19/2020. FINDINGS: The cardiac silhouette is mildly enlarged. Rounded density posteriorly in the left lung base is chronic and is favored to reflect either a Bochdalek's hernia or focal eventration of the left hemidiaphragm and associated atelectasis. The lungs are clear elsewhere. No pleural effusion or pneumothorax is identified. A right shoulder arthroplasty is noted. IMPRESSION: No active cardiopulmonary disease. Electronically Signed   By: Logan Bores M.D.   On: 05/03/2022 12:02

## 2022-05-07 NOTE — Progress Notes (Signed)
Progress Note  Patient Name: Barbara [name] Date of Encounter: 05/07/2022  [facility] Cardiologist: Shelva Majestic, MD   Subjective   Currently feeling well  Inpatient Medications    Scheduled Meds:  aspirin  81 mg Oral Daily   atropine  1 mg Intravenous Once   Chlorhexidine Gluconate Cloth  6 each Topical Q0600   hydrALAZINE  50 mg Oral Q8H   isosorbide mononitrate  30 mg Oral Daily   pantoprazole  40 mg Oral Daily   rosuvastatin  40 mg Oral QHS   sodium chloride flush  3 mL Intravenous Q12H   sodium chloride flush  3 mL Intravenous Q12H   sodium chloride flush  3 mL Intravenous Q12H   Continuous Infusions:  sodium chloride     PRN Meds: sodium chloride, acetaminophen, docusate sodium, hydrALAZINE, lip balm, nitroGLYCERIN, ondansetron (ZOFRAN) IV, polyethylene glycol, sodium chloride flush   Vital Signs    Vitals:   05/07/22 0344 05/07/22 0400 05/07/22 0500 05/07/22 0747  BP:  128/62 (!) 142/52   Pulse:  81 81   Resp:  (!) 22 (!) 23   Temp: 97.6 F (36.4 C)   98.1 F (36.7 C)  TempSrc: Axillary   Oral  SpO2:  94% 95%   Weight:   80.2 kg   Height:        Intake/Output Summary (Last 24 hours) at 05/07/2022 1020 Last data filed at 05/07/2022 0500 Gross per 24 hour  Intake --  Output 1100 ml  Net -1100 ml       05/07/2022    5:00 AM 05/06/2022    5:00 AM 05/05/2022    5:00 AM  Last 3 Weights  Weight (lbs) 176 lb 11.2 oz 175 lb 8 oz 175 lb 12.8 oz  Weight (kg) 80.151 kg 79.606 kg 79.742 kg      Telemetry    Sinus rhythm, sinus tachycardia  ECG    None new  Physical Exam   GEN: Well nourished, well developed, in no acute distress  HEENT: normal  Neck: no JVD, carotid bruits, or masses Cardiac: RRR; no murmurs, rubs, or gallops,no edema  Respiratory:  clear to auscultation bilaterally, normal work of breathing GI: soft, nontender, nondistended, + BS MS: no deformity or atrophy  Skin: warm and dry Neuro:  Strength and sensation are  intact Psych: euthymic mood, full affect   Labs    High Sensitivity Troponin:   Recent Labs  Lab 05/03/22 1206 05/03/22 1346 05/03/22 1545 05/03/22 1745 05/04/22 0009  TROPONINIHS 569* 801* 1,052* 1,115* 521*      Chemistry Recent Labs  Lab 05/04/22 0318 05/06/22 0629 05/07/22 0101  NA 141 138 140  K 4.2 3.8 4.2  CL 106 109 109  CO2 23 20* 23  GLUCOSE 116* 103* 114*  BUN 24* 21 23  CREATININE 1.58* 1.54* 1.58*  CALCIUM 10.1 10.0 9.7  MG 2.0 1.8 1.7  PROT 6.5  --   --   ALBUMIN 3.7  --   --   AST 26  --   --   ALT 16  --   --   ALKPHOS 66  --   --   BILITOT 0.8  --   --   GFRNONAA 32* 33* 32*  ANIONGAP '12 9 8     '$ Lipids No results for input(s): "CHOL", "TRIG", "HDL", "LABVLDL", "LDLCALC", "CHOLHDL" in the last 168 hours.  Hematology Recent Labs  Lab 05/05/22 0044 05/06/22 0629 05/07/22 0101  WBC 11.8* 8.6  7.5  RBC 4.28 4.62 4.06  HGB 12.7 13.1 12.0  HCT 37.3 40.6 36.0  MCV 87.1 87.9 88.7  MCH 29.7 28.4 29.6  MCHC 34.0 32.3 33.3  RDW 12.7 12.6 12.8  PLT 188 200 183    Thyroid No results for input(s): "TSH", "FREET4" in the last 168 hours.  BNP Recent Labs  Lab 05/06/22 0629 05/07/22 0101  BNP 639.3* 636.3*     DDimer No results for input(s): "DDIMER" in the last 168 hours.   Radiology      Cardiac Studies   05/04/22: TTE  1. Left ventricular ejection fraction, by estimation, is >75%. The left  ventricle has hyperdynamic function. The left ventricle has no regional  wall motion abnormalities. Left ventricular diastolic parameters are  indeterminate. Elevated left atrial  pressure.   2. Right ventricular systolic function is normal. The right ventricular  size is normal. There is mildly elevated pulmonary artery systolic  pressure.   3. Left atrial size was moderately dilated.   4. The mitral valve is normal in structure. Moderate mitral valve  regurgitation. No evidence of mitral stenosis.   5. The aortic valve is tricuspid. Aortic  valve regurgitation is not  visualized. Aortic valve sclerosis is present, with no evidence of aortic  valve stenosis.   6. The inferior vena cava is normal in size with greater than 50%  respiratory variability, suggesting right atrial pressure of 3 mmHg.   10/08/2020; TTE  1. Left ventricular ejection fraction, by estimation, is 70 to 75%. The  left ventricle has hyperdynamic function. The left ventricle has no  regional wall motion abnormalities. There is moderate eccentric left  ventricular hypertrophy of the apical  segment. Left ventricular diastolic parameters are indeterminate. Elevated  left ventricular end-diastolic pressure.   2. Right ventricular systolic function is normal. The right ventricular  size is normal. Tricuspid regurgitation signal is inadequate for assessing  PA pressure.   3. The mitral valve is abnormal. Mild mitral valve regurgitation.  Moderate mitral annular calcification.   4. The aortic valve is grossly normal. There is mild calcification of the  aortic valve. There is mild thickening of the aortic valve. Aortic valve  regurgitation is not visualized. Mild aortic valve sclerosis is present,  with no evidence of aortic valve  stenosis.   Comparison(s): No significant change from prior study.     02/26/2019: LHC Prox RCA to Mid RCA lesion is 15% stenosed. Ost LAD to Prox LAD lesion is 20% stenosed. Prox Cx lesion is 20% stenosed.   Mild non-obstructive coronary artery disease with a patent proximal LAD stent with mild 20% intimal hyperplasia (inserted  09/1999); 20% proximal left circumflex narrowing, and mild irregularity of 10 to 15% in the mid RCA.   Hyperdynamic LV function with an "Ace of Spade "configuration and near cavity obliteration in the mid to apical segment during systole.  There is evidence for left ventricular hypertrophy.  LVEDP is 16 mm.     Patient Profile      81 y.o. female w/PMHx of AFib, apical hypertrophic cardiomyopathy,  coronary artery disease with history of PCI of the LAD, hypertension, hyperlipidemia, OSA, CKD (III), initially admitted to [facility] with CP, w/concerns of NSTEMI got a dose of BB resulting in development of marked bradycardia > glucagon, atropine and isuprel with improvement  Planned for cath, though suspect 2/2 demand ischemia 2/2 RVR on presentation EP consult for tachy-brady  Assessment & Plan    1.  Paroxysmal atrial fibrillation: CHA2DS2-VASc  of at least 5.  On Eliquis at home but being held for procedures.  Dofetilide stopped this admission.  May need to be reloaded post pacemaker implant.  2.  Tachybradycardia syndrome: AV nodal blocking agents have been stopped.  Has been previously maintained on dofetilide.  Had junctional rhythm.  Dilana Mcphie need pacemaker implant, plan for tomorrow.  3.  Coronary artery disease: Left heart catheterization with moderate coronary artery disease.  No stents placed.  Continue with current management.  4.  Apical hypertrophic cardiomyopathy: Plan for outpatient genetics referral     For questions or updates, please contact [place] Please consult [email or web address] for contact info under        Signed, Addalee Kavanagh Meredith Leeds, MD  05/07/2022, 10:20 AM

## 2022-05-07 NOTE — Progress Notes (Signed)
Pt refusing cpap for the night. ?

## 2022-05-08 ENCOUNTER — Inpatient Hospital Stay (HOSPITAL_COMMUNITY): Payer: Medicare Other

## 2022-05-08 ENCOUNTER — Encounter (HOSPITAL_COMMUNITY)
Admission: EM | Disposition: A | Discharge status: Home Health | Payer: Self-pay | Source: Home / Self Care | Attending: Internal Medicine

## 2022-05-08 ENCOUNTER — Inpatient Hospital Stay (HOSPITAL_COMMUNITY): Payer: Medicare Other | Admitting: Certified Registered Nurse Anesthetist

## 2022-05-08 ENCOUNTER — Inpatient Hospital Stay (HOSPITAL_COMMUNITY)
Admission: EM | Disposition: A | Discharge status: Home Health | Payer: Self-pay | Source: Home / Self Care | Attending: Internal Medicine

## 2022-05-08 DIAGNOSIS — R079 Chest pain, unspecified: Secondary | ICD-10-CM

## 2022-05-08 DIAGNOSIS — I495 Sick sinus syndrome: Secondary | ICD-10-CM

## 2022-05-08 DIAGNOSIS — I2510 Atherosclerotic heart disease of native coronary artery without angina pectoris: Secondary | ICD-10-CM

## 2022-05-08 DIAGNOSIS — I110 Hypertensive heart disease with heart failure: Secondary | ICD-10-CM

## 2022-05-08 DIAGNOSIS — I509 Heart failure, unspecified: Secondary | ICD-10-CM

## 2022-05-08 HISTORY — PX: PACEMAKER IMPLANT: EP1218

## 2022-05-08 LAB — CBC
HCT: 38.6 % (ref 36.0–46.0)
Hemoglobin: 12.6 g/dL (ref 12.0–15.0)
MCH: 29.2 pg (ref 26.0–34.0)
MCHC: 32.6 g/dL (ref 30.0–36.0)
MCV: 89.4 fL (ref 80.0–100.0)
Platelets: 192 10*3/uL (ref 150–400)
RBC: 4.32 MIL/uL (ref 3.87–5.11)
RDW: 12.8 % (ref 11.5–15.5)
WBC: 7.7 10*3/uL (ref 4.0–10.5)
nRBC: 0 % (ref 0.0–0.2)

## 2022-05-08 LAB — BASIC METABOLIC PANEL
Anion gap: 12 (ref 5–15)
BUN: 23 mg/dL (ref 8–23)
CO2: 23 mmol/L (ref 22–32)
Calcium: 10.3 mg/dL (ref 8.9–10.3)
Chloride: 103 mmol/L (ref 98–111)
Creatinine, Ser: 1.8 mg/dL — ABNORMAL HIGH (ref 0.44–1.00)
GFR, Estimated: 28 mL/min — ABNORMAL LOW (ref >60)
Glucose, Bld: 109 mg/dL — ABNORMAL HIGH (ref 70–99)
Potassium: 4.3 mmol/L (ref 3.5–5.1)
Sodium: 138 mmol/L (ref 135–145)

## 2022-05-08 LAB — MAGNESIUM
Magnesium: 1.7 mg/dL (ref 1.7–2.4)
Magnesium: 2.3 mg/dL (ref 1.7–2.4)

## 2022-05-08 LAB — LIPOPROTEIN A (LPA): Lipoprotein (a): 206.4 nmol/L — ABNORMAL HIGH (ref <75.0)

## 2022-05-08 LAB — BRAIN NATRIURETIC PEPTIDE: B Natriuretic Peptide: 520.4 pg/mL — ABNORMAL HIGH (ref 0.0–100.0)

## 2022-05-08 SURGERY — PACEMAKER IMPLANT

## 2022-05-08 SURGERY — PACEMAKER IMPLANT
Anesthesia: General

## 2022-05-08 MED ORDER — CEFAZOLIN SODIUM-DEXTROSE 1-4 GM/50ML-% IV SOLN
1.0000 g | Freq: Four times a day (QID) | INTRAVENOUS | Status: DC
  Administered 2022-05-08 – 2022-05-09 (×2): 1 g via INTRAVENOUS
  Filled 2022-05-08 (×3): qty 50

## 2022-05-08 MED ORDER — PHENYLEPHRINE 80 MCG/ML (10ML) SYRINGE FOR IV PUSH (FOR BLOOD PRESSURE SUPPORT)
PREFILLED_SYRINGE | INTRAVENOUS | Status: DC | PRN
  Administered 2022-05-08: 80 ug via INTRAVENOUS
  Administered 2022-05-08 (×2): 160 ug via INTRAVENOUS

## 2022-05-08 MED ORDER — FENTANYL CITRATE (PF) 100 MCG/2ML IJ SOLN
INTRAMUSCULAR | Status: DC | PRN
  Administered 2022-05-08: 25 ug via INTRAVENOUS
  Administered 2022-05-08: 100 ug via INTRAVENOUS

## 2022-05-08 MED ORDER — HYDRALAZINE HCL 20 MG/ML IJ SOLN
INTRAMUSCULAR | Status: AC
  Filled 2022-05-08: qty 1

## 2022-05-08 MED ORDER — CHLORHEXIDINE GLUCONATE 4 % EX LIQD
60.0000 mL | Freq: Once | CUTANEOUS | Status: AC
  Administered 2022-05-08: 4 via TOPICAL
  Filled 2022-05-08: qty 60

## 2022-05-08 MED ORDER — PROPOFOL 10 MG/ML IV BOLUS
INTRAVENOUS | Status: DC | PRN
  Administered 2022-05-08: 30 mg via INTRAVENOUS
  Administered 2022-05-08: 100 mg via INTRAVENOUS

## 2022-05-08 MED ORDER — HEPARIN (PORCINE) IN NACL 1000-0.9 UT/500ML-% IV SOLN
INTRAVENOUS | Status: DC | PRN
  Administered 2022-05-08: 500 mL

## 2022-05-08 MED ORDER — DEXAMETHASONE SODIUM PHOSPHATE 10 MG/ML IJ SOLN
INTRAMUSCULAR | Status: DC | PRN
  Administered 2022-05-08: 10 mg via INTRAVENOUS

## 2022-05-08 MED ORDER — BISACODYL 5 MG PO TBEC: 10.0000 mg | DELAYED_RELEASE_TABLET | Freq: Once | ORAL | Status: DC

## 2022-05-08 MED ORDER — APIXABAN 2.5 MG PO TABS: 2.5000 mg | ORAL_TABLET | Freq: Two times a day (BID) | ORAL | Status: DC

## 2022-05-08 MED ORDER — CEFAZOLIN SODIUM-DEXTROSE 2-4 GM/100ML-% IV SOLN
INTRAVENOUS | Status: AC
  Filled 2022-05-08: qty 100

## 2022-05-08 MED ORDER — SODIUM CHLORIDE 0.9 % IV SOLN
250.0000 mL | INTRAVENOUS | Status: DC
  Administered 2022-05-08: 250 mL via INTRAVENOUS

## 2022-05-08 MED ORDER — SODIUM CHLORIDE 0.9% FLUSH: 3.0000 mL | INTRAVENOUS | Status: DC | PRN

## 2022-05-08 MED ORDER — SODIUM CHLORIDE 0.9 % IV SOLN: INTRAVENOUS | Status: DC

## 2022-05-08 MED ORDER — CEFAZOLIN SODIUM-DEXTROSE 2-4 GM/100ML-% IV SOLN
2.0000 g | INTRAVENOUS | Status: AC
  Administered 2022-05-08: 2 g via INTRAVENOUS

## 2022-05-08 MED ORDER — ONDANSETRON HCL 4 MG/2ML IJ SOLN: 4.0000 mg | Freq: Four times a day (QID) | INTRAMUSCULAR | Status: DC | PRN

## 2022-05-08 MED ORDER — DOFETILIDE 125 MCG PO CAPS
125.0000 ug | ORAL_CAPSULE | Freq: Two times a day (BID) | ORAL | Status: DC
  Administered 2022-05-08 – 2022-05-09 (×2): 125 ug via ORAL
  Filled 2022-05-08 (×3): qty 1

## 2022-05-08 MED ORDER — ONDANSETRON HCL 4 MG/2ML IJ SOLN
INTRAMUSCULAR | Status: DC | PRN
  Administered 2022-05-08: 4 mg via INTRAVENOUS

## 2022-05-08 MED ORDER — SODIUM CHLORIDE 0.9 % IV SOLN
250.0000 mL | INTRAVENOUS | Status: DC | PRN
  Administered 2022-05-08: 250 mL via INTRAVENOUS

## 2022-05-08 MED ORDER — ACETAMINOPHEN 325 MG PO TABS: 325.0000 mg | ORAL_TABLET | ORAL | Status: DC | PRN

## 2022-05-08 MED ORDER — PHENYLEPHRINE HCL-NACL 20-0.9 MG/250ML-% IV SOLN
INTRAVENOUS | Status: DC | PRN
  Administered 2022-05-08: 30 ug/min via INTRAVENOUS

## 2022-05-08 MED ORDER — SODIUM CHLORIDE 0.9 % IV SOLN
INTRAVENOUS | Status: AC
  Filled 2022-05-08: qty 2

## 2022-05-08 MED ORDER — MAGNESIUM SULFATE 2 GM/50ML IV SOLN
2.0000 g | Freq: Once | INTRAVENOUS | Status: AC
  Administered 2022-05-08: 2 g via INTRAVENOUS
  Filled 2022-05-08: qty 50

## 2022-05-08 MED ORDER — LIDOCAINE HCL 1 % IJ SOLN
INTRAMUSCULAR | Status: AC
  Filled 2022-05-08: qty 60

## 2022-05-08 MED ORDER — EPHEDRINE SULFATE-NACL 50-0.9 MG/10ML-% IV SOSY
PREFILLED_SYRINGE | INTRAVENOUS | Status: DC | PRN
  Administered 2022-05-08 (×3): 5 mg via INTRAVENOUS

## 2022-05-08 MED ORDER — HEPARIN (PORCINE) IN NACL 1000-0.9 UT/500ML-% IV SOLN
INTRAVENOUS | Status: AC
  Filled 2022-05-08: qty 500

## 2022-05-08 MED ORDER — SODIUM CHLORIDE 0.9 % IV SOLN
80.0000 mg | INTRAVENOUS | Status: AC
  Administered 2022-05-08: 80 mg
  Filled 2022-05-08: qty 2

## 2022-05-08 MED ORDER — POLYETHYLENE GLYCOL 3350 17 G PO PACK
17.0000 g | PACK | Freq: Two times a day (BID) | ORAL | Status: DC
  Administered 2022-05-08 – 2022-05-10 (×4): 17 g via ORAL
  Filled 2022-05-08 (×4): qty 1

## 2022-05-08 MED ORDER — LIDOCAINE HCL (PF) 1 % IJ SOLN: INTRAMUSCULAR | Status: DC | PRN

## 2022-05-08 MED ORDER — SODIUM CHLORIDE 0.9% FLUSH
3.0000 mL | Freq: Two times a day (BID) | INTRAVENOUS | Status: DC
  Administered 2022-05-08: 3 mL via INTRAVENOUS

## 2022-05-08 SURGICAL SUPPLY — 17 items
CABLE SURGICAL S-101-97-12 (CABLE) ×2 IMPLANT
CATH RIGHTSITE C315HIS02 (CATHETERS) ×1 IMPLANT
IPG PACE AZUR XT DR MRI W1DR01 (Pacemaker) IMPLANT
LEAD CAPSURE NOVUS 5076-52CM (Lead) ×1 IMPLANT
LEAD SELECT SECURE 3830 383069 (Lead) IMPLANT
NDL 18GX3-1/2 9CM (NEEDLE) IMPLANT
NEEDLE 18GX3-1/2 9CM (NEEDLE) ×4 IMPLANT
PACE AZURE XT DR MRI W1DR01 (Pacemaker) ×2 IMPLANT
PAD DEFIB RADIO PHYSIO CONN (PAD) ×2 IMPLANT
SELECT SECURE 3830 383069 (Lead) ×2 IMPLANT
SHEATH 7FR PRELUDE SNAP 13 (SHEATH) ×1 IMPLANT
SHEATH 9FR PRELUDE SNAP 13 (SHEATH) ×1 IMPLANT
SHEATH PROBE COVER 6X72 (BAG) ×1 IMPLANT
SLITTER 6232ADJ (MISCELLANEOUS) ×1 IMPLANT
SLITTER UNIVERSAL DS2A003 (MISCELLANEOUS) ×1 IMPLANT
TRAY PACEMAKER INSERTION (PACKS) ×2 IMPLANT
WIRE HI TORQ VERSACORE-J 145CM (WIRE) ×1 IMPLANT

## 2022-05-08 NOTE — Progress Notes (Signed)
Pharmacy: Dofetilide (Tikosyn) - Initial Consult Assessment and Electrolyte Replacement  Pharmacy consulted to assist in monitoring and replacing electrolytes in this 81 y.o. female admitted on 05/03/2022 undergoing dofetilide re-initiation. First dofetilide dose: 05/08/2022 at 9pm  Assessment:  Patient Exclusion Criteria: If any screening criteria checked as "Yes", then  patient  should NOT receive dofetilide until criteria item is corrected.  If "Yes" please indicate correction plan.  YES  NO Patient  Exclusion Criteria Correction Plan   '[]'$   '[x]'$   Baseline QTc interval is greater than or equal to 440 msec. IF above YES box checked dofetilide contraindicated unless patient has ICD; then may proceed if QTc 500-550 msec or with known ventricular conduction abnormalities may proceed with QTc 550-600 msec. QTc = 0.51    '[]'$   '[x]'$   Patient is known or suspected to have a digoxin level greater than 2 ng/ml: No results found for: "DIGOXIN"     '[]'$   '[x]'$   Creatinine clearance less than 20 ml/min (calculated using Cockcroft-Gault, actual body weight and serum creatinine): Estimated Creatinine Clearance: 25.8 mL/min (A) (by C-G formula based on SCr of 1.8 mg/dL (H)).     '[]'$   '[x]'$  Patient has received drugs known to prolong the QT intervals within the last 48 hours (phenothiazines, tricyclics or tetracyclic antidepressants, erythromycin, H-1 antihistamines, cisapride, fluoroquinolones, azithromycin, ondansetron).   Updated information on QT prolonging agents is available to be searched on the following database:QT prolonging agents     '[]'$   '[x]'$   Patient received a dose of hydrochlorothiazide (Oretic) alone or in any combination including triamterene (Dyazide, Maxzide) in the last 48 hours.    '[]'$   '[x]'$  Patient received a medication known to increase dofetilide plasma concentrations prior to initial dofetilide dose:  Trimethoprim (Primsol, Proloprim) in the last 36 hours Verapamil (Calan, Verelan)  in the last 36 hours or a sustained release dose in the last 72 hours Megestrol (Megace) in the last 5 days  Cimetidine (Tagamet) in the last 6 hours Ketoconazole (Nizoral) in the last 24 hours Itraconazole (Sporanox) in the last 48 hours  Prochlorperazine (Compazine) in the last 36 hours     '[]'$   '[x]'$   Patient is known to have a history of torsades de pointes; congenital or acquired long QT syndromes.    '[]'$   '[x]'$   Patient has received a Class 1 antiarrhythmic with less than 2 half-lives since last dose. (Disopyramide, Quinidine, Procainamide, Lidocaine, Mexiletine, Flecainide, Propafenone)    '[]'$   '[x]'$   Patient has received amiodarone therapy in the past 3 months or amiodarone level is greater than 0.3 ng/ml.    Patient has been appropriately anticoagulated with apixaban PTA - on hold for PPM placement today - patient remains in SR  - restart dofetilide per EP.  Labs:    Component Value Date/Time   K 4.3 05/08/2022 0135   MG 2.3 05/08/2022 1909     Plan: Potassium: K >/= 4: Appropriate to initiate Tikosyn, no replacement needed    Magnesium: Magnesium 1.7 > replaced with 2gm IV and recheck magnesium level 2.3     Bonnita Nasuti Pharm.D. CPP, BCPS Clinical Pharmacist [phone] 05/08/2022 8:27 PM

## 2022-05-08 NOTE — Interval H&P Note (Signed)
History and Physical Interval Note:  05/08/2022 2:14 PM  Barbara [name]  has presented today for surgery, with the diagnosis of chest pain.  The various methods of treatment have been discussed with the patient and family. After consideration of risks, benefits and other options for treatment, the patient has consented to  Procedure(s): PACEMAKER IMPLANT (N/A) as a surgical intervention.  The patient's history has been reviewed, patient examined, no change in status, stable for surgery.  I have reviewed the patient's chart and labs.  Questions were answered to the patient's satisfaction.     Akita [employer]

## 2022-05-08 NOTE — Progress Notes (Signed)
Pt refusing cpap for tonight.

## 2022-05-08 NOTE — Transfer of Care (Signed)
Immediate Anesthesia Transfer of Care Note  Patient: Barbara [name]  Procedure(s) Performed: PACEMAKER IMPLANT  Patient Location: Cath Lab  Anesthesia Type:General  Level of Consciousness: awake and drowsy  Airway & Oxygen Therapy: Patient Spontanous Breathing and Patient connected to nasal cannula oxygen  Post-op Assessment: Report given to RN and Post -op Vital signs reviewed and stable  Post vital signs: Reviewed and stable  Last Vitals:  Vitals Value Taken Time  BP 197/67 05/08/22 1715  Temp    Pulse 100 05/08/22 1716  Resp 17 05/08/22 1716  SpO2 92% 05/08/22 1716  Vitals shown include unvalidated device data.  Last Pain:  Vitals:   05/08/22 1515  TempSrc:   PainSc: 0-No pain         Complications: There were no known notable events for this encounter.

## 2022-05-08 NOTE — Progress Notes (Signed)
OT Cancellation Note  Patient Details Name: Barbara [name] MRN: [id number] DOB: 09-Jan-1941   Cancelled Treatment:    Reason Eval/Treat Not Completed: Patient at procedure or test/ unavailable.  Awaiting pacemaker placement.  Continue as appropriate.    Barbara [name] D Sharran Caratachea 05/08/2022, 3:08 PM

## 2022-05-08 NOTE — Progress Notes (Signed)
Sinus rhythm confirmed in procedure Will resume home Tikosyn tonight NO eliquis yet given she remains in SR with fresh pacer Stat mag level ordered  after IV mag this Am  Tommye Standard, PA-C

## 2022-05-08 NOTE — Anesthesia Preprocedure Evaluation (Addendum)
Anesthesia Evaluation  Patient identified by MRN, date of birth, ID band Patient awake    Reviewed: Allergy & Precautions, NPO status , Patient's Chart, lab work & pertinent test results  History of Anesthesia Complications (+) PONV and history of anesthetic complications  Airway Mallampati: II  TM Distance: >3 FB Neck ROM: Full   Comment: TMJ Dental no notable dental hx. (+) Dental Advisory Given   Pulmonary shortness of breath, pneumonia, former smoker,    Pulmonary exam normal        Cardiovascular hypertension, Pt. on medications + CAD, + Past MI and +CHF  Normal cardiovascular exam+ dysrhythmias Atrial Fibrillation + Valvular Problems/Murmurs   Echo 05/03/2022 IMPRESSIONS    1. Left ventricular ejection fraction, by estimation, is >75%. The left  ventricle has hyperdynamic function. The left ventricle has no regional  wall motion abnormalities. Left ventricular diastolic parameters are  indeterminate. Elevated left atrial  pressure.  2. Right ventricular systolic function is normal. The right ventricular  size is normal. There is mildly elevated pulmonary artery systolic  pressure.  3. Left atrial size was moderately dilated.  4. The mitral valve is normal in structure. Moderate mitral valve  regurgitation. No evidence of mitral stenosis.  5. The aortic valve is tricuspid. Aortic valve regurgitation is not  visualized. Aortic valve sclerosis is present, with no evidence of aortic  valve stenosis.  6. The inferior vena cava is normal in size with greater than 50%  respiratory variability, suggesting right atrial pressure of 3 mmHg.    Neuro/Psych  Headaches, negative psych ROS   GI/Hepatic Neg liver ROS, GERD  ,  Endo/Other  negative endocrine ROS  Renal/GU Renal InsufficiencyRenal disease  negative genitourinary   Musculoskeletal  (+) Arthritis ,   Abdominal   Peds negative pediatric ROS (+)   Hematology negative hematology ROS (+)   Anesthesia Other Findings   Reproductive/Obstetrics negative OB ROS                            Anesthesia Physical  Anesthesia Plan  ASA: 3  Anesthesia Plan: General   Post-op Pain Management:    Induction: Intravenous  PONV Risk Score and Plan: 4 or greater and Ondansetron, Dexamethasone and Treatment may vary due to age or medical condition  Airway Management Planned: LMA  Additional Equipment:   Intra-op Plan:   Post-operative Plan: Extubation in OR  Informed Consent: I have reviewed the patients History and Physical, chart, labs and discussed the procedure including the risks, benefits and alternatives for the proposed anesthesia with the patient or authorized representative who has indicated his/her understanding and acceptance.     Dental advisory given  Plan Discussed with: CRNA and Anesthesiologist  Anesthesia Plan Comments: (  )       Anesthesia Quick Evaluation

## 2022-05-08 NOTE — Progress Notes (Addendum)
Progress Note  Patient Name: Barbara [name] Date of Encounter: 05/08/2022  [facility] Cardiologist: Shelva Majestic, MD   Subjective   Feels well currently, having difficulty urinating this AM, in bed  Inpatient Medications    Scheduled Meds:  aspirin  81 mg Oral Daily   atropine  1 mg Intravenous Once   chlorhexidine  60 mL Topical Once   Chlorhexidine Gluconate Cloth  6 each Topical Q0600   gentamicin (GARAMYCIN) 80 mg in sodium chloride 0.9 % 500 mL irrigation  80 mg Irrigation On Call   hydrALAZINE  50 mg Oral Q8H   isosorbide mononitrate  30 mg Oral Daily   pantoprazole  40 mg Oral Daily   rosuvastatin  40 mg Oral QHS   sodium chloride flush  3 mL Intravenous Q12H   Continuous Infusions:  sodium chloride     sodium chloride 50 mL/hr at 05/08/22 0548   sodium chloride 250 mL (05/08/22 0557)    ceFAZolin (ANCEF) IV     PRN Meds: sodium chloride, acetaminophen, docusate sodium, hydrALAZINE, lip balm, nitroGLYCERIN, ondansetron (ZOFRAN) IV, polyethylene glycol, sodium chloride flush   Vital Signs    Vitals:   05/08/22 0425 05/08/22 0500 05/08/22 0600 05/08/22 0700  BP:  (!) 140/47 (!) 156/64 (!) 149/59  Pulse:  82 83 88  Resp:  '18 19 19  '$ Temp:  98.1 F (36.7 C)    TempSrc:  Oral    SpO2:  95% 96% 95%  Weight: 79.8 kg     Height:        Intake/Output Summary (Last 24 hours) at 05/08/2022 0814 Last data filed at 05/08/2022 0400 Gross per 24 hour  Intake --  Output 1750 ml  Net -1750 ml      05/08/2022    4:25 AM 05/07/2022    5:00 AM 05/06/2022    5:00 AM  Last 3 Weights  Weight (lbs) 175 lb 14.8 oz 176 lb 11.2 oz 175 lb 8 oz  Weight (kg) 79.8 kg 80.151 kg 79.606 kg      Telemetry    SR 70's, PACs,- Personally Reviewed with dr. Curt Bears, ? If she is in an AFlutter  ECG    No new EKGs - Personally Reviewed  Physical Exam   GEN: No acute distress.   Neck: No JVD Cardiac: RRR, no murmurs, rubs, or gallops.  Respiratory: CTA b/l. GI: Soft,  nontender, non-distended  MS: No edema; No deformity. Neuro:  Nonfocal  Psych: Normal affect   Labs    High Sensitivity Troponin:   Recent Labs  Lab 05/03/22 1206 05/03/22 1346 05/03/22 1545 05/03/22 1745 05/04/22 0009  TROPONINIHS 569* 801* 1,052* 1,115* 521*     Chemistry Recent Labs  Lab 05/04/22 0318 05/06/22 0629 05/07/22 0101 05/08/22 0135  NA 141 138 140 138  K 4.2 3.8 4.2 4.3  CL 106 109 109 103  CO2 23 20* 23 23  GLUCOSE 116* 103* 114* 109*  BUN 24* '21 23 23  '$ CREATININE 1.58* 1.54* 1.58* 1.80*  CALCIUM 10.1 10.0 9.7 10.3  MG 2.0 1.8 1.7 1.7  PROT 6.5  --   --   --   ALBUMIN 3.7  --   --   --   AST 26  --   --   --   ALT 16  --   --   --   ALKPHOS 66  --   --   --   BILITOT 0.8  --   --   --  GFRNONAA 32* 33* 32* 28*  ANIONGAP '12 9 8 12    '$ Lipids No results for input(s): "CHOL", "TRIG", "HDL", "LABVLDL", "LDLCALC", "CHOLHDL" in the last 168 hours.  Hematology Recent Labs  Lab 05/06/22 0629 05/07/22 0101 05/08/22 0135  WBC 8.6 7.5 7.7  RBC 4.62 4.06 4.32  HGB 13.1 12.0 12.6  HCT 40.6 36.0 38.6  MCV 87.9 88.7 89.4  MCH 28.4 29.6 29.2  MCHC 32.3 33.3 32.6  RDW 12.6 12.8 12.8  PLT 200 183 192   Thyroid No results for input(s): "TSH", "FREET4" in the last 168 hours.  BNP Recent Labs  Lab 05/06/22 0629 05/07/22 0101 05/08/22 0135  BNP 639.3* 636.3* 520.4*    DDimer No results for input(s): "DDIMER" in the last 168 hours.   Radiology      Cardiac Studies   05/04/22: TTE  1. Left ventricular ejection fraction, by estimation, is >75%. The left  ventricle has hyperdynamic function. The left ventricle has no regional  wall motion abnormalities. Left ventricular diastolic parameters are  indeterminate. Elevated left atrial  pressure.   2. Right ventricular systolic function is normal. The right ventricular  size is normal. There is mildly elevated pulmonary artery systolic  pressure.   3. Left atrial size was moderately dilated.   4.  The mitral valve is normal in structure. Moderate mitral valve  regurgitation. No evidence of mitral stenosis.   5. The aortic valve is tricuspid. Aortic valve regurgitation is not  visualized. Aortic valve sclerosis is present, with no evidence of aortic  valve stenosis.   6. The inferior vena cava is normal in size with greater than 50%  respiratory variability, suggesting right atrial pressure of 3 mmHg.   10/08/2020; TTE  1. Left ventricular ejection fraction, by estimation, is 70 to 75%. The  left ventricle has hyperdynamic function. The left ventricle has no  regional wall motion abnormalities. There is moderate eccentric left  ventricular hypertrophy of the apical  segment. Left ventricular diastolic parameters are indeterminate. Elevated  left ventricular end-diastolic pressure.   2. Right ventricular systolic function is normal. The right ventricular  size is normal. Tricuspid regurgitation signal is inadequate for assessing  PA pressure.   3. The mitral valve is abnormal. Mild mitral valve regurgitation.  Moderate mitral annular calcification.   4. The aortic valve is grossly normal. There is mild calcification of the  aortic valve. There is mild thickening of the aortic valve. Aortic valve  regurgitation is not visualized. Mild aortic valve sclerosis is present,  with no evidence of aortic valve  stenosis.   Comparison(s): No significant change from prior study.     02/26/2019: LHC Prox RCA to Mid RCA lesion is 15% stenosed. Ost LAD to Prox LAD lesion is 20% stenosed. Prox Cx lesion is 20% stenosed.   Mild non-obstructive coronary artery disease with a patent proximal LAD stent with mild 20% intimal hyperplasia (inserted  09/1999); 20% proximal left circumflex narrowing, and mild irregularity of 10 to 15% in the mid RCA.   Hyperdynamic LV function with an "Ace of Spade "configuration and near cavity obliteration in the mid to apical segment during systole.  There is  evidence for left ventricular hypertrophy.  LVEDP is 16 mm.     Patient Profile      81 y.o. female w/PMHx of AFib, apical hypertrophic cardiomyopathy, coronary artery disease with history of PCI of the LAD, hypertension, hyperlipidemia, OSA, CKD (III), initially admitted to [facility] with CP, w/concerns of NSTEMI got  a dose of BB resulting in development of marked bradycardia > glucagon, atropine and isuprel with improvement  Planned for cath, though suspect 2/2 demand ischemia 2/2 RVR on presentation EP consult for tachy-brady  Assessment & Plan    Paroxysmal Afib  CHA2DS2Vasc is at least 5, on eliquis at home, held for procedures Home Tikosyn stopped on admission Mellina Benison plan for Tikosyn re-load, Dr/ Genee Rann d/w the patient today, she prefers to stay the extra days to get reloaded while here then come back later Suspect she is In SR, though can not be sure if she is in a Aflutter IF she is in a flutter Joshau Code need to delay reload until back on her [place] post pacing IF she is in SR at time of her implant Dewarren Ledbetter reload  I Torsha Lemus get a repeat mag later this afternoon after some IVF and mag CalcCrCl is 30 and ok for her home 16mg dose Meds look ok She got a single dose of Zofran 05/06/22 at 0653 (1/2 life is 3.1-5.8 hours), should be ok EKG today   Tachy-brady Historically has had her nodal blocking agents stopped 2/2 bradycardia as well. Maintained on Tikosyn until this admission For PPM today Delayed 2/2 her severe allergy to local anesthesia agents and need for general  Dr. CCurt Bearshas seen her this AM, answered her follo up questions and she remains agreeable to proceed   3. CP 4. CAD Not an ongoing complaint HS Trop peaked at 1115 > last 521 Cath this admission with no obstructive disease  5. Apical HCM Not felt to be volume OL recommended for out patient referral for genetics consult   Encourage OOB Transfer to 6e in anticipation of Tikosyn    For questions or updates, please  contact [place]Please consult [email or web address] for contact info under        Signed, RBaldwin Jamaica PA-C  05/08/2022, 8:14 AM    I have seen and examined this patient with RTommye Standard  Agree with above, note added to reflect my findings.  Feeling well without complaint.  GEN: Well nourished, well developed, in no acute distress  HEENT: normal  Neck: no JVD, carotid bruits, or masses Cardiac: RRR; no murmurs, rubs, or gallops,no edema  Respiratory:  clear to auscultation bilaterally, normal work of breathing GI: soft, nontender, nondistended, + BS MS: no deformity or atrophy  Skin: warm and dry Neuro:  Strength and sensation are intact Psych: euthymic mood, full affect   Tachybradycardia syndrome: Rate control Twyla Dais be difficult.  We Ezzard Ditmer plan for pacemaker implant today.  Risk and benefits were discussed which include bleeding, tamponade, infection, pneumothorax, lead dislodgment.  She understands these risks and is agreed to the procedure. Paroxysmal atrial fibrillation: She is back in sinus rhythm.  She has had minimal episodes of atrial fibrillation on Tikosyn.  For some reason Tikosyn was held this admission.  We Jahzier Villalon plan for transfer to 6 E. for dofetilide load.  Tequan Redmon M. Karma Hiney MD 05/08/2022 10:25 AM

## 2022-05-08 NOTE — H&P (View-Only) (Signed)
Progress Note  Patient Name: Barbara [name] Date of Encounter: 05/08/2022  [facility] Cardiologist: Shelva Majestic, MD   Subjective   Feels well currently, having difficulty urinating this AM, in bed  Inpatient Medications    Scheduled Meds:  aspirin  81 mg Oral Daily   atropine  1 mg Intravenous Once   chlorhexidine  60 mL Topical Once   Chlorhexidine Gluconate Cloth  6 each Topical Q0600   gentamicin (GARAMYCIN) 80 mg in sodium chloride 0.9 % 500 mL irrigation  80 mg Irrigation On Call   hydrALAZINE  50 mg Oral Q8H   isosorbide mononitrate  30 mg Oral Daily   pantoprazole  40 mg Oral Daily   rosuvastatin  40 mg Oral QHS   sodium chloride flush  3 mL Intravenous Q12H   Continuous Infusions:  sodium chloride     sodium chloride 50 mL/hr at 05/08/22 0548   sodium chloride 250 mL (05/08/22 0557)    ceFAZolin (ANCEF) IV     PRN Meds: sodium chloride, acetaminophen, docusate sodium, hydrALAZINE, lip balm, nitroGLYCERIN, ondansetron (ZOFRAN) IV, polyethylene glycol, sodium chloride flush   Vital Signs    Vitals:   05/08/22 0425 05/08/22 0500 05/08/22 0600 05/08/22 0700  BP:  (!) 140/47 (!) 156/64 (!) 149/59  Pulse:  82 83 88  Resp:  '18 19 19  '$ Temp:  98.1 F (36.7 C)    TempSrc:  Oral    SpO2:  95% 96% 95%  Weight: 79.8 kg     Height:        Intake/Output Summary (Last 24 hours) at 05/08/2022 0814 Last data filed at 05/08/2022 0400 Gross per 24 hour  Intake --  Output 1750 ml  Net -1750 ml      05/08/2022    4:25 AM 05/07/2022    5:00 AM 05/06/2022    5:00 AM  Last 3 Weights  Weight (lbs) 175 lb 14.8 oz 176 lb 11.2 oz 175 lb 8 oz  Weight (kg) 79.8 kg 80.151 kg 79.606 kg      Telemetry    SR 70's, PACs,- Personally Reviewed with dr. Curt Bears, ? If she is in an AFlutter  ECG    No new EKGs - Personally Reviewed  Physical Exam   GEN: No acute distress.   Neck: No JVD Cardiac: RRR, no murmurs, rubs, or gallops.  Respiratory: CTA b/l. GI: Soft,  nontender, non-distended  MS: No edema; No deformity. Neuro:  Nonfocal  Psych: Normal affect   Labs    High Sensitivity Troponin:   Recent Labs  Lab 05/03/22 1206 05/03/22 1346 05/03/22 1545 05/03/22 1745 05/04/22 0009  TROPONINIHS 569* 801* 1,052* 1,115* 521*     Chemistry Recent Labs  Lab 05/04/22 0318 05/06/22 0629 05/07/22 0101 05/08/22 0135  NA 141 138 140 138  K 4.2 3.8 4.2 4.3  CL 106 109 109 103  CO2 23 20* 23 23  GLUCOSE 116* 103* 114* 109*  BUN 24* '21 23 23  '$ CREATININE 1.58* 1.54* 1.58* 1.80*  CALCIUM 10.1 10.0 9.7 10.3  MG 2.0 1.8 1.7 1.7  PROT 6.5  --   --   --   ALBUMIN 3.7  --   --   --   AST 26  --   --   --   ALT 16  --   --   --   ALKPHOS 66  --   --   --   BILITOT 0.8  --   --   --  GFRNONAA 32* 33* 32* 28*  ANIONGAP '12 9 8 12    '$ Lipids No results for input(s): "CHOL", "TRIG", "HDL", "LABVLDL", "LDLCALC", "CHOLHDL" in the last 168 hours.  Hematology Recent Labs  Lab 05/06/22 0629 05/07/22 0101 05/08/22 0135  WBC 8.6 7.5 7.7  RBC 4.62 4.06 4.32  HGB 13.1 12.0 12.6  HCT 40.6 36.0 38.6  MCV 87.9 88.7 89.4  MCH 28.4 29.6 29.2  MCHC 32.3 33.3 32.6  RDW 12.6 12.8 12.8  PLT 200 183 192   Thyroid No results for input(s): "TSH", "FREET4" in the last 168 hours.  BNP Recent Labs  Lab 05/06/22 0629 05/07/22 0101 05/08/22 0135  BNP 639.3* 636.3* 520.4*    DDimer No results for input(s): "DDIMER" in the last 168 hours.   Radiology      Cardiac Studies   05/04/22: TTE  1. Left ventricular ejection fraction, by estimation, is >75%. The left  ventricle has hyperdynamic function. The left ventricle has no regional  wall motion abnormalities. Left ventricular diastolic parameters are  indeterminate. Elevated left atrial  pressure.   2. Right ventricular systolic function is normal. The right ventricular  size is normal. There is mildly elevated pulmonary artery systolic  pressure.   3. Left atrial size was moderately dilated.   4.  The mitral valve is normal in structure. Moderate mitral valve  regurgitation. No evidence of mitral stenosis.   5. The aortic valve is tricuspid. Aortic valve regurgitation is not  visualized. Aortic valve sclerosis is present, with no evidence of aortic  valve stenosis.   6. The inferior vena cava is normal in size with greater than 50%  respiratory variability, suggesting right atrial pressure of 3 mmHg.   10/08/2020; TTE  1. Left ventricular ejection fraction, by estimation, is 70 to 75%. The  left ventricle has hyperdynamic function. The left ventricle has no  regional wall motion abnormalities. There is moderate eccentric left  ventricular hypertrophy of the apical  segment. Left ventricular diastolic parameters are indeterminate. Elevated  left ventricular end-diastolic pressure.   2. Right ventricular systolic function is normal. The right ventricular  size is normal. Tricuspid regurgitation signal is inadequate for assessing  PA pressure.   3. The mitral valve is abnormal. Mild mitral valve regurgitation.  Moderate mitral annular calcification.   4. The aortic valve is grossly normal. There is mild calcification of the  aortic valve. There is mild thickening of the aortic valve. Aortic valve  regurgitation is not visualized. Mild aortic valve sclerosis is present,  with no evidence of aortic valve  stenosis.   Comparison(s): No significant change from prior study.     02/26/2019: LHC Prox RCA to Mid RCA lesion is 15% stenosed. Ost LAD to Prox LAD lesion is 20% stenosed. Prox Cx lesion is 20% stenosed.   Mild non-obstructive coronary artery disease with a patent proximal LAD stent with mild 20% intimal hyperplasia (inserted  09/1999); 20% proximal left circumflex narrowing, and mild irregularity of 10 to 15% in the mid RCA.   Hyperdynamic LV function with an "Ace of Spade "configuration and near cavity obliteration in the mid to apical segment during systole.  There is  evidence for left ventricular hypertrophy.  LVEDP is 16 mm.     Patient Profile      81 y.o. female w/PMHx of AFib, apical hypertrophic cardiomyopathy, coronary artery disease with history of PCI of the LAD, hypertension, hyperlipidemia, OSA, CKD (III), initially admitted to [facility] with CP, w/concerns of NSTEMI got  a dose of BB resulting in development of marked bradycardia > glucagon, atropine and isuprel with improvement  Planned for cath, though suspect 2/2 demand ischemia 2/2 RVR on presentation EP consult for tachy-brady  Assessment & Plan    Paroxysmal Afib  CHA2DS2Vasc is at least 5, on eliquis at home, held for procedures Home Tikosyn stopped on admission Geni Skorupski plan for Tikosyn re-load, Dr/ Ameen Mostafa d/w the patient today, she prefers to stay the extra days to get reloaded while here then come back later Suspect she is In SR, though can not be sure if she is in a Aflutter IF she is in a flutter Kelsey Edman need to delay reload until back on her [place] post pacing IF she is in SR at time of her implant Jewell Ryans reload  I Sira Adsit get a repeat mag later this afternoon after some IVF and mag CalcCrCl is 30 and ok for her home 160mg dose Meds look ok She got a single dose of Zofran 05/06/22 at 0653 (1/2 life is 3.1-5.8 hours), should be ok EKG today   Tachy-brady Historically has had her nodal blocking agents stopped 2/2 bradycardia as well. Maintained on Tikosyn until this admission For PPM today Delayed 2/2 her severe allergy to local anesthesia agents and need for general  Dr. CCurt Bearshas seen her this AM, answered her follo up questions and she remains agreeable to proceed   3. CP 4. CAD Not an ongoing complaint HS Trop peaked at 1115 > last 521 Cath this admission with no obstructive disease  5. Apical HCM Not felt to be volume OL recommended for out patient referral for genetics consult   Encourage OOB Transfer to 6e in anticipation of Tikosyn    For questions or updates, please  contact [place] consult [email or web address] for contact info under        Signed, RBaldwin Jamaica PA-C  05/08/2022, 8:14 AM    I have seen and examined this patient with RTommye Standard  Agree with above, note added to reflect my findings.  Feeling well without complaint.  GEN: Well nourished, well developed, in no acute distress  HEENT: normal  Neck: no JVD, carotid bruits, or masses Cardiac: RRR; no murmurs, rubs, or gallops,no edema  Respiratory:  clear to auscultation bilaterally, normal work of breathing GI: soft, nontender, nondistended, + BS MS: no deformity or atrophy  Skin: warm and dry Neuro:  Strength and sensation are intact Psych: euthymic mood, full affect   Tachybradycardia syndrome: Rate control Shontez Sermon be difficult.  We Aloise Copus plan for pacemaker implant today.  Risk and benefits were discussed which include bleeding, tamponade, infection, pneumothorax, lead dislodgment.  She understands these risks and is agreed to the procedure. Paroxysmal atrial fibrillation: She is back in sinus rhythm.  She has had minimal episodes of atrial fibrillation on Tikosyn.  For some reason Tikosyn was held this admission.  We Aydrian Halpin plan for transfer to 6 E. for dofetilide load.  Jaielle Dlouhy M. Garnie Borchardt MD 05/08/2022 10:25 AM

## 2022-05-08 NOTE — Anesthesia Procedure Notes (Signed)
Procedure Name: LMA Insertion Date/Time: 05/08/2022 3:31 PM  Performed by: Dorthea Cove, CRNAPre-anesthesia Checklist: Patient identified, Emergency Drugs available, Suction available and Patient being monitored Patient Re-evaluated:Patient Re-evaluated prior to induction Oxygen Delivery Method: Circle System Utilized Preoxygenation: Pre-oxygenation with 100% oxygen Induction Type: IV induction LMA: LMA inserted LMA Size: 4.0 Number of attempts: 2 Placement Confirmation: positive ETCO2 Tube secured with: Tape Dental Injury: Teeth and Oropharynx as per pre-operative assessment

## 2022-05-08 NOTE — Anesthesia Postprocedure Evaluation (Signed)
Anesthesia Post Note  Patient: Barbara [name]  Procedure(s) Performed: PACEMAKER IMPLANT     Patient location during evaluation: PACU Anesthesia Type: General Level of consciousness: sedated Pain management: pain level controlled Vital Signs Assessment: post-procedure vital signs reviewed and stable Respiratory status: spontaneous breathing and respiratory function stable Cardiovascular status: stable Postop Assessment: no apparent nausea or vomiting Anesthetic complications: no   There were no known notable events for this encounter.  Last Vitals:  Vitals:   05/08/22 1748 05/08/22 1750  BP:  (!) 166/61  Pulse:  98  Resp:  (!) 23  Temp: 36.4 C   SpO2:  94%    Last Pain:  Vitals:   05/08/22 1748  TempSrc: Temporal  PainSc:                  Taurus Alamo DANIEL

## 2022-05-08 NOTE — Discharge Instructions (Addendum)
R wrist (procedure site) Post procedure care instructions No lifting over 5 lbs for 1 week. No vigorous activity for 1 week.  Keep procedure site clean & dry. If you notice increased pain, swelling, bleeding or pus, call/return!  You may shower after 24 hours, but no soaking in baths/hot tubs/pools for 1 week.         Supplemental Discharge Instructions for  Pacemaker Patients   Activity No heavy lifting or vigorous activity with your left/right arm for 6 to 8 weeks.  Do not raise your left/right arm above your head for one week.  Gradually raise your affected arm as drawn below.            05/13/22                      05/14/22                    05/15/22                  05/16/22 __  NO DRIVING until cleared to at your wound check visit  [place] the wound area clean and dry.  Do not get this area wet , no showers until cleared to at your wound check visit . The tape/steri-strips on your wound will fall off; do not pull them off.  No bandage is needed on the site.  DO  NOT apply any creams, oils, or ointments to the wound area. If you notice any drainage or discharge from the wound, any swelling or bruising at the site, or you develop a fever > 101? F after you are discharged home, call the office at once.  Special Instructions You are still able to use cellular telephones; use the ear opposite the side where you have your pacemaker/defibrillator.  Avoid carrying your cellular phone near your device. When traveling through airports, show security personnel your identification card to avoid being screened in the metal detectors.  Ask the security personnel to use the hand wand. Avoid arc welding equipment, MRI testing (magnetic resonance imaging), TENS units (transcutaneous nerve stimulators).  Call the office for questions about other devices. Avoid electrical appliances that are in poor condition or are not properly grounded. Microwave ovens are safe to be near or to  operate.   Follow with Primary MD Laurey Morale, MD in 7 days   Get CBC, CMP, Magnesium, Two view Chest X ray -  checked next visit within 1 week by Primary MD   Activity: As tolerated with Full fall precautions use walker/cane & assistance as needed  Disposition Home   Diet: Heart Healthy    Special Instructions: If you have smoked or chewed Tobacco  in the last 2 yrs please stop smoking, stop any regular Alcohol  and or any Recreational drug use.  On your next visit with your primary care physician please Get Medicines reviewed and adjusted.  Please request your Prim.MD to go over all Hospital Tests and Procedure/Radiological results at the follow up, please get all Hospital records sent to your Prim MD by signing hospital release before you go home.  If you experience worsening of your admission symptoms, develop shortness of breath, life threatening emergency, suicidal or homicidal thoughts you must seek medical attention immediately by calling 911 or calling your MD immediately  if symptoms less severe.  You Must read complete instructions/literature along with all the possible adverse reactions/side effects for all the Medicines you take  and that have been prescribed to you. Take any new Medicines after you have completely understood and accpet all the possible adverse reactions/side effects.

## 2022-05-08 NOTE — Progress Notes (Signed)
PROGRESS NOTE                                                                                                                                                                                                             Patient Demographics:    Barbara [name], is a 81 y.o. female, DOB - 09/25/1941, ACZ:[id number]  Outpatient Primary MD for the patient is Laurey Morale, MD    LOS - 5  Admit date - 05/03/2022    Chief Complaint  Patient presents with   Chest Pain   Palpitations       Brief Narrative (HPI from H&P)   80 year old female with pertinent PMH of HTN, HLD, CAD with stent of LAD and 2000, paroxysmal A-fib on Eliquis, OSA on nasal CPAP presents to [facility] on 6/30 with chest pain/shortness of breath, she was diagnosed with NSTEMI, she was seen by PCCM and cardiology placed on heparin drip.  Currently stable transferred to my care on 05/05/2022   Subjective:   Patient in bed, appears comfortable, denies any headache, no fever, no chest pain or pressure, no shortness of breath , no abdominal pain. No new focal weakness.   Assessment  & Plan :     NSTEMI with tachybradycardia syndrome.  Currently chest pain-free, on heparin drip, had received Toprol-XL causing some bradycardia after which she briefly required isoproterenol drip.  She was seen by cardiology underwent left heart cath which showed nonobstructing CAD, currently on medical management with combination of aspirin, statin, hydralazine and Imdur.  Pain-free.  Beta-blocker being avoided due to tachybradycardia syndrome.   2.  Tachybradycardia syndrome.  As in #1 above.  EP also following.  Avoid nodal blocking agents.  Likely will get pacemaker placed ton 05/08/22.  3.  GERD.  On PPI.  4.  Essential hypertension.  Placed on combination of hydralazine and Imdur.  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  5.  Dyslipidemia.  On statin.  6.  Mild AKI on CKD 3 a baseline  creatinine close to 1.5.  Monitor.  Gentle IV fluids while NPO.  7.  Mild nonspecific leukocytosis.  Afebrile, stable UA and chest x-ray.        Condition - Fair  Family Communication  : None present  Code Status :  Full  Consults  :  Cards/EP, PCCM  PUD  Prophylaxis : PPI   Procedures  :     TTE - 1. Left ventricular ejection fraction, by estimation, is >75%. The left ventricle has hyperdynamic function. The left ventricle has no regional wall motion abnormalities. Left ventricular diastolic parameters are indeterminate. Elevated left atrial pressure.  2. Right ventricular systolic function is normal. The right ventricular size is normal. There is mildly elevated pulmonary artery systolic pressure.  3. Left atrial size was moderately dilated.  4. The mitral valve is normal in structure. Moderate mitral valve regurgitation. No evidence of mitral stenosis.  5. The aortic valve is tricuspid. Aortic valve regurgitation is not visualized. Aortic valve sclerosis is present, with no evidence of aortic valve stenosis.  6. The inferior vena cava is normal in size with greater than 50% respiratory variability, suggesting right atrial pressure of 3 mmHg.      Disposition Plan  :    Status is: Inpatient  DVT Prophylaxis  :  Hep gtt  Place and maintain sequential compression device Start: 05/08/22 0648 SCD's Start: 05/06/22 1210 SCDs Start: 05/03/22 1602     Lab Results  Component Value Date   PLT 192 05/08/2022    Diet :  Diet Order             Diet NPO time specified Except for: Sips with Meds  Diet effective midnight                    Inpatient Medications  Scheduled Meds:  aspirin  81 mg Oral Daily   atropine  1 mg Intravenous Once   chlorhexidine  60 mL Topical Once   Chlorhexidine Gluconate Cloth  6 each Topical Q0600   gentamicin (GARAMYCIN) 80 mg in sodium chloride 0.9 % 500 mL irrigation  80 mg Irrigation On Call   hydrALAZINE  50 mg Oral Q8H   isosorbide  mononitrate  30 mg Oral Daily   pantoprazole  40 mg Oral Daily   rosuvastatin  40 mg Oral QHS   sodium chloride flush  3 mL Intravenous Q12H   Continuous Infusions:  sodium chloride     sodium chloride 50 mL/hr at 05/08/22 0548   sodium chloride 250 mL (05/08/22 0557)    ceFAZolin (ANCEF) IV     PRN Meds:.sodium chloride, acetaminophen, docusate sodium, hydrALAZINE, lip balm, nitroGLYCERIN, ondansetron (ZOFRAN) IV, polyethylene glycol, sodium chloride flush  Time Spent in minutes  30   Lala Lund M.D on 05/08/2022 at 8:15 AM  To page go to [email or web address]   [facility] -  Office  [phone]  See all Orders from today for further details    Objective:   Vitals:   05/08/22 0425 05/08/22 0500 05/08/22 0600 05/08/22 0700  BP:  (!) 140/47 (!) 156/64 (!) 149/59  Pulse:  82 83 88  Resp:  '18 19 19  '$ Temp:  98.1 F (36.7 C)    TempSrc:  Oral    SpO2:  95% 96% 95%  Weight: 79.8 kg     Height:        Wt Readings from Last 3 Encounters:  05/08/22 79.8 kg  03/13/22 80.3 kg  01/08/22 79.4 kg     Intake/Output Summary (Last 24 hours) at 05/08/2022 0815 Last data filed at 05/08/2022 0400 Gross per 24 hour  Intake --  Output 1750 ml  Net -1750 ml     Physical Exam  Awake Alert, No new F.N deficits, Normal affect [place].AT,PERRAL Supple Neck, No JVD,   Symmetrical Chest wall  movement, Good air movement bilaterally, CTAB RRR,No Gallops, Rubs or new Murmurs,  +ve B.Sounds, Abd Soft, No tenderness,   No Cyanosis, Clubbing or edema     Data Review:    CBC Recent Labs  Lab 05/03/22 1259 05/04/22 0318 05/05/22 0044 05/06/22 0629 05/07/22 0101 05/08/22 0135  WBC 8.5 10.2 11.8* 8.6 7.5 7.7  HGB 12.3 11.8* 12.7 13.1 12.0 12.6  HCT 38.2 35.2* 37.3 40.6 36.0 38.6  PLT 177 181 188 200 183 192  MCV 90.7 87.8 87.1 87.9 88.7 89.4  MCH 29.2 29.4 29.7 28.4 29.6 29.2  MCHC 32.2 33.5 34.0 32.3 33.3 32.6  RDW 12.5 12.6 12.7 12.6 12.8 12.8  LYMPHSABS 1.0  --   --   --    --   --   MONOABS 0.5  --   --   --   --   --   EOSABS 0.0  --   --   --   --   --   BASOSABS 0.0  --   --   --   --   --     Electrolytes Recent Labs  Lab 05/03/22 1206 05/04/22 0318 05/06/22 0629 05/07/22 0101 05/08/22 0135  NA 138 141 138 140 138  K 4.6 4.2 3.8 4.2 4.3  CL 109 106 109 109 103  CO2 23 23 20* 23 23  GLUCOSE 151* 116* 103* 114* 109*  BUN 28* 24* '21 23 23  '$ CREATININE 1.33* 1.58* 1.54* 1.58* 1.80*  CALCIUM 9.7 10.1 10.0 9.7 10.3  AST  --  26  --   --   --   ALT  --  16  --   --   --   ALKPHOS  --  66  --   --   --   BILITOT  --  0.8  --   --   --   ALBUMIN  --  3.7  --   --   --   MG 2.2 2.0 1.8 1.7 1.7  BNP  --   --  639.3* 636.3* 520.4*   Micro Results Recent Results (from the past 240 hour(s))  MRSA Next Gen by PCR, Nasal     Status: None   Collection Time: 05/03/22  9:19 PM   Specimen: Nasal Mucosa; Nasal Swab  Result Value Ref Range Status   MRSA by PCR Next Gen NOT DETECTED NOT DETECTED Final    Comment: (NOTE) The GeneXpert MRSA Assay (FDA approved for NASAL specimens only), is one component of a comprehensive MRSA colonization surveillance program. It is not intended to diagnose MRSA infection nor to guide or monitor treatment for MRSA infections. Test performance is not FDA approved in patients less than 77 years old. Performed at [facility], [place] [address] [place], [place] [address]   Surgical PCR screen     Status: None   Collection Time: 05/05/22  8:15 PM   Specimen: Nasal Mucosa; Nasal Swab  Result Value Ref Range Status   MRSA, PCR NEGATIVE NEGATIVE Final   Staphylococcus aureus NEGATIVE NEGATIVE Final    Comment: (NOTE) The Xpert SA Assay (FDA approved for NASAL specimens in patients 70 years of age and older), is one component of a comprehensive surveillance program. It is not intended to diagnose infection nor to guide or monitor treatment. Performed at [facility], [place] [address], [place], [place] [address]    Urine Culture     Status: Abnormal   Collection Time: 05/05/22  8:23 PM   Specimen: Urine, Clean Catch  Result Value Ref Range Status   Specimen Description URINE, CLEAN CATCH  Final   Special Requests   Final    NONE Performed at [facility], [place] [address], [place], [place] [address]    Culture MULTIPLE SPECIES PRESENT, SUGGEST RECOLLECTION (A)  Final   Report Status 05/07/2022 FINAL  Final    Radiology Reports CARDIAC CATHETERIZATION  Result Date: 05/06/2022 Conclusions: Mild-moderate, non-obstructive coronary artery disease. Patent proximal/mid LAD stent with up to 30% in-stent restenosis. Low left ventricular filling pressure (LVEDP ~5 mmHg). Recommendations: Continue secondary prevention of coronary artery disease. Proceed with pacemaker placement per Dr. Caryl Comes. Nelva Bush, MD [facility]  ECHOCARDIOGRAM COMPLETE  Result Date: 05/04/2022    ECHOCARDIOGRAM REPORT   Patient Name:   DENETTA FEI Date of Exam: 05/04/2022 Medical Rec #:  [id number]              Height:       67.0 in Accession #:    [id number]             Weight:       181.2 lb Date of Birth:  1941/03/28              BSA:          1.939 m Patient Age:    1 years               BP:           133/41 mmHg Patient Gender: F                      HR:           45 bpm. Exam Location:  Inpatient Procedure: 2D Echo, Color Doppler, Cardiac Doppler and Intracardiac            Opacification Agent Indications:    R07.9* Chest pain, unspecified; 122-I22.9 Subsequent ST                 elevation (STEM) and non-ST elevation (NSTEMI) myocardial                 infarction  History:        Patient has prior history of Echocardiogram examinations, most                 recent 10/08/2020. CAD, Abnormal ECG, Arrythmias:Bradycardia and                 Atrial Fibrillation, Signs/Symptoms:Chest Pain and Dyspnea; Risk                 Factors:Dyslipidemia and Hypertension.  Sonographer:    Roseanna Rainbow RDCS Referring Phys: Margurite Auerbach   Sonographer Comments: Technically difficult study due to poor echo windows. Image acquisition challenging due to patient body habitus. IMPRESSIONS  1. Left ventricular ejection fraction, by estimation, is >75%. The left ventricle has hyperdynamic function. The left ventricle has no regional wall motion abnormalities. Left ventricular diastolic parameters are indeterminate. Elevated left atrial pressure.  2. Right ventricular systolic function is normal. The right ventricular size is normal. There is mildly elevated pulmonary artery systolic pressure.  3. Left atrial size was moderately dilated.  4. The mitral valve is normal in structure. Moderate mitral valve regurgitation. No evidence of mitral stenosis.  5. The aortic valve is tricuspid. Aortic valve regurgitation is not visualized. Aortic valve sclerosis is present, with no evidence of  aortic valve stenosis.  6. The inferior vena cava is normal in size with greater than 50% respiratory variability, suggesting right atrial pressure of 3 mmHg. FINDINGS  Left Ventricle: Left ventricular ejection fraction, by estimation, is >75%. The left ventricle has hyperdynamic function. The left ventricle has no regional wall motion abnormalities. Definity contrast agent was given IV to delineate the left ventricular endocardial borders. The left ventricular internal cavity size was normal in size. There is no left ventricular hypertrophy. Left ventricular diastolic parameters are indeterminate. Elevated left atrial pressure. Right Ventricle: The right ventricular size is normal. Right ventricular systolic function is normal. There is mildly elevated pulmonary artery systolic pressure. The tricuspid regurgitant velocity is 3.14 m/s, and with an assumed right atrial pressure of 3 mmHg, the estimated right ventricular systolic pressure is 25.3 mmHg. Left Atrium: Left atrial size was moderately dilated. Right Atrium: Right atrial size was normal in size. Pericardium: There is no  evidence of pericardial effusion. Mitral Valve: The mitral valve is normal in structure. Moderate mitral valve regurgitation. No evidence of mitral valve stenosis. MV peak gradient, 8.6 mmHg. The mean mitral valve gradient is 2.0 mmHg. Tricuspid Valve: The tricuspid valve is normal in structure. Tricuspid valve regurgitation is mild . No evidence of tricuspid stenosis. Aortic Valve: The aortic valve is tricuspid. Aortic valve regurgitation is not visualized. Aortic valve sclerosis is present, with no evidence of aortic valve stenosis. Pulmonic Valve: The pulmonic valve was normal in structure. Pulmonic valve regurgitation is trivial. No evidence of pulmonic stenosis. Aorta: The aortic root is normal in size and structure. Venous: The inferior vena cava is normal in size with greater than 50% respiratory variability, suggesting right atrial pressure of 3 mmHg. IAS/Shunts: No atrial level shunt detected by color flow Doppler.  LEFT VENTRICLE PLAX 2D LVIDd:         4.80 cm      Diastology LVIDs:         2.70 cm      LV e' medial:    3.50 cm/s LV PW:         0.80 cm      LV E/e' medial:  40.3 LV IVS:        1.00 cm      LV e' lateral:   7.56 cm/s LVOT diam:     1.70 cm      LV E/e' lateral: 18.7 LV SV:         82 LV SV Index:   42 LVOT Area:     2.27 cm  LV Volumes (MOD) LV vol d, MOD A2C: 111.0 ml LV vol d, MOD A4C: 79.0 ml LV vol s, MOD A2C: 35.2 ml LV vol s, MOD A4C: 35.0 ml LV SV MOD A2C:     75.8 ml LV SV MOD A4C:     79.0 ml LV SV MOD BP:      59.2 ml RIGHT VENTRICLE             IVC RV S prime:     10.50 cm/s  IVC diam: 1.70 cm TAPSE (M-mode): 1.4 cm LEFT ATRIUM             Index        RIGHT ATRIUM           Index LA diam:        5.20 cm 2.68 cm/m   RA Area:     19.70 cm LA Vol (A2C):   79.9 ml 41.20 ml/m  RA Volume:   57.70 ml  29.75 ml/m LA Vol (A4C):   66.9 ml 34.50 ml/m LA Biplane Vol: 74.7 ml 38.52 ml/m  AORTIC VALVE LVOT Vmax:   156.00 cm/s LVOT Vmean:  96.000 cm/s LVOT VTI:    0.362 m  AORTA Ao Root  diam: 2.50 cm Ao Asc diam:  3.00 cm MITRAL VALVE                  TRICUSPID VALVE MV Area (PHT): 2.87 cm       TR Peak grad:   39.4 mmHg MV Area VTI:   2.13 cm       TR Vmax:        314.00 cm/s MV Peak grad:  8.6 mmHg MV Mean grad:  2.0 mmHg       SHUNTS MV Vmax:       1.47 m/s       Systemic VTI:  0.36 m MV Vmean:      57.4 cm/s      Systemic Diam: 1.70 cm MV Decel Time: 264 msec MR Peak grad:    110.7 mmHg MR Mean grad:    77.0 mmHg MR Vmax:         526.00 cm/s MR Vmean:        409.0 cm/s MR PISA:         1.01 cm MR PISA Eff ROA: 8 mm MR PISA Radius:  0.40 cm MV E velocity: 141.00 cm/s MV A velocity: 49.30 cm/s MV E/A ratio:  2.86 Kirk Ruths MD Electronically signed by Kirk Ruths MD Signature Date/Time: 05/04/2022/11:50:25 AM    Final

## 2022-05-08 NOTE — TOC Initial Note (Signed)
Transition of Care [facility]) - Initial/Assessment Note    Patient Details  Name: Barbara [name] MRN: [id number] Date of Birth: 1941/06/22  Transition [facility] CM/SW Contact:    Bethena Roys, RN Phone Number: 05/08/2022, 2:40 PM  Clinical Narrative: Case Manager spoke with the patient regarding disposition needs. PTA patient was from home alone. Patient states she has family and church family support. Patient has a cane and rolling walker in the home. Case Manager discussed home health needs and patient is agreeable to [facility] PT/Aide Services-patient does not have a preference for an agency. Case Manager called [facility] and they can service the patient with start of care to begin within 24-48 hours post transition home. Patient will need HH PT/Aide Orders and Face to face.  Patient states she has transportation to appointments and for home once she is stable. Case Manager will continue to follow for additional transition of care needs.             Expected Discharge Plan: [place] Barriers to Discharge: Continued Medical Work up   Patient Goals and CMS Choice Patient states their goals for this hospitalization and ongoing recovery are:: to return home.   Choice offered to / list presented to : Patient (Ptient does not have a preference for agency.)  Expected Discharge Plan and Services Expected Discharge Plan: [place] In-house Referral: NA Discharge Planning Services: CM Consult Post Acute Care Choice: [place] arrangements for the past 2 months: Single Family Home                 DME Arranged: N/A DME Agency: NA       HH Arranged: PT, Nurse's Aide HH Agency: [facility] Agency Contacted: 05/08/22 Time HH Agency Contacted: 1400 Representative spoke with at [place]: [place] Arrangements/Services Living arrangements for the past 2 months: Between with::  Self Patient language and need for interpreter reviewed:: Yes Do you feel safe going back to the place where you live?: Yes      Need for Family Participation in Patient Care: No (Comment) Care giver support system in place?: No (comment) Current home services: DME Criminal Activity/Legal Involvement Pertinent to Current Situation/Hospitalization: No - Comment as needed  Activities of Daily Living Home Assistive Devices/Equipment: Eyeglasses, Cane (specify quad or straight), Walker (specify type) ADL Screening (condition at time of admission) Patient's cognitive ability adequate to safely complete daily activities?: Yes Is the patient deaf or have difficulty hearing?: No Does the patient have difficulty seeing, even when wearing glasses/contacts?: No Does the patient have difficulty concentrating, remembering, or making decisions?: No Patient able to express need for assistance with ADLs?: Yes Does the patient have difficulty dressing or bathing?: No Independently performs ADLs?: Yes (appropriate for developmental age) Does the patient have difficulty walking or climbing stairs?: Yes (gets sob) Weakness of Legs: None Weakness of Arms/Hands: None  Permission Sought/Granted Permission sought to share information with : Family Supports, Customer service manager, Case Optician, dispensing granted to share information with : Yes, Verbal Permission Granted     Permission granted to share info w AGENCY: [facility]        Emotional Assessment Appearance:: Appears stated age Attitude/Demeanor/Rapport: Engaged Affect (typically observed): Appropriate Orientation: : Oriented to Situation, Oriented to  Time, Oriented to Place, Oriented to Self Alcohol / Substance Use: Not Applicable Psych Involvement: No (comment)  Admission diagnosis:  Chest pain [  R07.9] Atrial fibrillation, unspecified type ([place]) [I48.91] Bradycardia [R00.1] Patient Active Problem List   Diagnosis Date Noted    Tachycardia-bradycardia syndrome ([place])    Acquired trigger finger of right index finger 05/10/2021   Trigger thumb of left hand 05/10/2021   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   Atrial fibrillation ([place]) 10/08/2020   Persistent atrial fibrillation ([place]) 10/07/2020   CKD (chronic kidney disease), stage III ([place]) 05/20/2020   Apical variant hypertrophic cardiomyopathy ([place])    Chest pain of uncertain etiology    Chest pain    S/P reverse total shoulder arthroplasty, 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   Encounter for orthopedic follow-up care 02/18/2020   PAF (paroxysmal atrial fibrillation) ([place]) 02/28/2019   Demand myocardial infarction ([place]) 02/25/2019   Pain in left knee 01/06/2018   Dysphonia 12/29/2017   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   CHF (congestive heart failure) ([place]) 11/16/2017   Gastroesophageal reflux disease 11/06/2017   Bradycardia 12/27/2015   Exertional dyspnea 12/23/2014   Sinus bradycardia 09/13/2014   LUMBAGO 05/03/2010   VERTIGO 01/21/2009   TEMPOROMANDIBULAR JOINT PAIN 08/31/2008   Osteoarthritis 05/31/2008   TROCHANTERIC BURSITIS 05/31/2008   Dyslipidemia 04/06/2007   Essential hypertension 04/06/2007   CAD (coronary artery disease) 04/06/2007   ALLERGIC RHINITIS 04/06/2007   Personal history of colonic polyps 04/06/2007   Hyperlipidemia 04/06/2007   PCP:  Laurey Morale, MD Pharmacy:   [facility], [place] [address] Phone: [phone] Fax: [phone]  [facility] ([place], [place] [facility] [address] [place] [address] [place] [address] Phone: [phone] Fax: [phone]   Readmission Risk Interventions     No data to display

## 2022-05-09 ENCOUNTER — Inpatient Hospital Stay (HOSPITAL_COMMUNITY): Payer: Medicare Other

## 2022-05-09 ENCOUNTER — Encounter (HOSPITAL_COMMUNITY): Payer: Self-pay | Admitting: Cardiology

## 2022-05-09 DIAGNOSIS — R001 Bradycardia, unspecified: Secondary | ICD-10-CM | POA: Diagnosis not present

## 2022-05-09 DIAGNOSIS — I214 Non-ST elevation (NSTEMI) myocardial infarction: Secondary | ICD-10-CM | POA: Diagnosis not present

## 2022-05-09 DIAGNOSIS — I480 Paroxysmal atrial fibrillation: Secondary | ICD-10-CM | POA: Diagnosis not present

## 2022-05-09 DIAGNOSIS — I495 Sick sinus syndrome: Secondary | ICD-10-CM | POA: Diagnosis not present

## 2022-05-09 LAB — CBC
HCT: 39.4 % (ref 36.0–46.0)
Hemoglobin: 12.6 g/dL (ref 12.0–15.0)
MCH: 28.2 pg (ref 26.0–34.0)
MCHC: 32 g/dL (ref 30.0–36.0)
MCV: 88.1 fL (ref 80.0–100.0)
Platelets: 234 10*3/uL (ref 150–400)
RBC: 4.47 MIL/uL (ref 3.87–5.11)
RDW: 13 % (ref 11.5–15.5)
WBC: 8.2 10*3/uL (ref 4.0–10.5)
nRBC: 0 % (ref 0.0–0.2)

## 2022-05-09 LAB — BASIC METABOLIC PANEL
Anion gap: 12 (ref 5–15)
BUN: 29 mg/dL — ABNORMAL HIGH (ref 8–23)
CO2: 21 mmol/L — ABNORMAL LOW (ref 22–32)
Calcium: 10.1 mg/dL (ref 8.9–10.3)
Chloride: 104 mmol/L (ref 98–111)
Creatinine, Ser: 1.57 mg/dL — ABNORMAL HIGH (ref 0.44–1.00)
GFR, Estimated: 33 mL/min — ABNORMAL LOW (ref >60)
Glucose, Bld: 145 mg/dL — ABNORMAL HIGH (ref 70–99)
Potassium: 4.6 mmol/L (ref 3.5–5.1)
Sodium: 137 mmol/L (ref 135–145)

## 2022-05-09 LAB — MAGNESIUM: Magnesium: 2.2 mg/dL (ref 1.7–2.4)

## 2022-05-09 MED ORDER — CEFAZOLIN SODIUM-DEXTROSE 1-4 GM/50ML-% IV SOLN
1.0000 g | Freq: Once | INTRAVENOUS | Status: AC
Start: 2022-05-09 — End: 2022-05-09
  Administered 2022-05-09: 1 g via INTRAVENOUS
  Filled 2022-05-09: qty 50

## 2022-05-09 MED ORDER — METOPROLOL SUCCINATE ER 25 MG PO TB24
25.0000 mg | ORAL_TABLET | Freq: Every day | ORAL | Status: DC
  Administered 2022-05-09 – 2022-05-10 (×2): 25 mg via ORAL
  Filled 2022-05-09 (×2): qty 1

## 2022-05-09 MED FILL — Lidocaine HCl Local Preservative Free (PF) Inj 1%: INTRAMUSCULAR | Qty: 30 | Status: AC

## 2022-05-09 NOTE — Plan of Care (Signed)

## 2022-05-09 NOTE — Progress Notes (Signed)
[place] to pt about CPAP. PT does not like our machines and refusing to wear. Removed CPAP from room. PT on RA sat 96%. No resp distress noted. Will continue to monitor.

## 2022-05-09 NOTE — Progress Notes (Addendum)
Post dose EKG is reviewed QT has lengthened. No post dose EKG last night though this AMs EKG QT was OK. Lytes and renal function stable  I have reached out to Dr. Curt Bears for review Unusual given she has been on Tikosyn for years.  Tommye Standard, PA-C   ADDEND: EKG is reviewed with EP MD, agrees, QT is long Tikosyn discontinued Should watch telemetry and get EKG in AM If OK anticipate starting amiodarone and likely ok for discharge tomorrow  Tommye Standard, PA-C

## 2022-05-09 NOTE — Evaluation (Signed)
Occupational Therapy Evaluation Patient Details Name: Barbara [name] MRN: [id number] DOB: 06/13/1941 Today's Date: 05/09/2022   History of Present Illness Pt is a 81yo female admitted for CP and bradycardia. PMH: paroxysmal atrial fibrillation, apical hypertrophic cardiomyopathy, coronary artery disease with history of PCI of the LAD, hypertension, hyperlipidemia, obstructive sleep apnea  Per patient, patient is going for pacemaker 7/5.   Clinical Impression   Patient admitted for the diagnosis above.  PTA she lives alone, has a friend that assists her with community mobility at times, and uses a cane for mobility outside.  Her plan is to return home with [facility], her son will be staying with her this weekend, and then a friend will be staying with her next week sometime.  OT will follow in the acute setting, but no HH OT is anticipated.        Recommendations for follow up therapy are one component of a multi-disciplinary discharge planning process, led by the attending physician.  Recommendations may be updated based on patient status, additional functional criteria and insurance authorization.   Follow Up Recommendations  Follow physician's recommendations for discharge plan and follow up therapies    Assistance Recommended at Discharge Intermittent Supervision/Assistance  Patient can return home with the following A little help with walking and/or transfers;A little help with bathing/dressing/bathroom;Assist for transportation;Assistance with cooking/housework    Functional Status Assessment  Patient has had a recent decline in their functional status and demonstrates the ability to make significant improvements in function in a reasonable and predictable amount of time.  Equipment Recommendations  None recommended by OT    Recommendations for Other Services       Precautions / Restrictions Precautions Precautions: Fall Restrictions Weight Bearing Restrictions: No       Mobility Bed Mobility Overal bed mobility: Needs Assistance Bed Mobility: Supine to Sit     Supine to sit: Min assist     General bed mobility comments: incresaed time, minA for trunk elevation    Transfers Overall transfer level: Needs assistance Equipment used: 1 person hand held assist, Quad cane Transfers: Sit to/from Stand, Bed to chair/wheelchair/BSC Sit to Stand: Min assist     Step pivot transfers: Min assist            Balance Overall balance assessment: Needs assistance Sitting-balance support: Feet supported Sitting balance-Leahy Scale: Good     Standing balance support: Single extremity supported, During functional activity Standing balance-Leahy Scale: Poor Standing balance comment: dependent on UE support                           ADL either performed or assessed with clinical judgement   ADL       Grooming: Wash/dry hands;Wash/dry face;Set up;Sitting           Upper Body Dressing : Minimal assistance;Sitting   Lower Body Dressing: Minimal assistance;Sit to/from stand   Toilet Transfer: Minimal assistance;Regular Toilet                   Vision Baseline Vision/History: 1 Wears glasses Patient Visual Report: No change from baseline       Perception Perception Perception: Within Functional Limits   Praxis Praxis Praxis: Intact    Pertinent Vitals/Pain Pain Assessment Pain Assessment: Faces Faces Pain Scale: Hurts a little bit Pain Location: L shoulder Pain Descriptors / Indicators: Sore Pain Intervention(s): Monitored during session     Hand Dominance Right   Extremity/Trunk Assessment  Upper Extremity Assessment Upper Extremity Assessment: Overall WFL for tasks assessed;LUE deficits/detail LUE Deficits / Details: Pacemaker precautions discussed LUE Sensation: WNL LUE Coordination: WNL   Lower Extremity Assessment Lower Extremity Assessment: Defer to PT evaluation   Cervical / Trunk  Assessment Cervical / Trunk Assessment: Normal   Communication Communication Communication: No difficulties   Cognition Arousal/Alertness: Awake/alert Behavior During Therapy: WFL for tasks assessed/performed Overall Cognitive Status: Within Functional Limits for tasks assessed                                       General Comments   VSS on RA    Exercises     Shoulder Instructions      Home Living Family/patient expects to be discharged to:: Private residence Living Arrangements: Alone Available Help at Discharge: Family;Available PRN/intermittently Type of Home: House Home Access: Stairs to enter [employer] of Steps: 1 Entrance Stairs-Rails: None Home Layout: One level     Bathroom Shower/Tub: Occupational psychologist: Handicapped height Bathroom Accessibility: Yes   Home Equipment: Cane - single Barista (2 wheels);Shower seat;BSC/3in1          Prior Functioning/Environment Prior Level of Function : Independent/Modified Independent             Mobility Comments: amb with SPC in community, no AD in home, drives short distances, has someone take her to appts and groceries ADLs Comments: indep, drives locally.        OT Problem List: Decreased activity tolerance;Impaired balance (sitting and/or standing);Pain      OT Treatment/Interventions: Self-care/ADL training;Therapeutic activities;Patient/family education;Balance training    OT Goals(Current goals can be found in the care plan section) Acute Rehab OT Goals Patient Stated Goal: Return home OT Goal Formulation: With patient Time For Goal Achievement: 05/23/22 Potential to Achieve Goals: Good ADL Goals Pt Will Perform Grooming: with set-up;standing Pt Will Perform Upper Body Dressing: with modified independence;sitting Pt Will Perform Lower Body Dressing: with set-up;sit to/from stand Pt Will Transfer to Toilet: with modified  independence;ambulating;regular height toilet  OT Frequency: Min 2X/week    Co-evaluation              AM-PAC OT "6 Clicks" Daily Activity     Outcome Measure Help from another person eating meals?: None Help from another person taking care of personal grooming?: None Help from another person toileting, which includes using toliet, bedpan, or urinal?: A Little Help from another person bathing (including washing, rinsing, drying)?: A Little Help from another person to put on and taking off regular upper body clothing?: A Little Help from another person to put on and taking off regular lower body clothing?: A Little 6 Click Score: 20   End of Session Equipment Utilized During Treatment: Gait belt;Other (comment) (Hand held assist) Nurse Communication: Mobility status  Activity Tolerance: Patient tolerated treatment well Patient left: in chair;with call bell/phone within reach  OT Visit Diagnosis: Unsteadiness on feet (R26.81);Muscle weakness (generalized) (M62.81);Pain Pain - Right/Left: Left Pain - part of body: Shoulder                Time: 5176-1607 OT Time Calculation (min): 17 min Charges:  OT General Charges $OT Visit: 1 Visit OT Evaluation $OT Eval Moderate Complexity: 1 Mod  05/09/2022  RP, OTR/L  Acute Rehabilitation Services  Office:  [phone]   Metta Clines 05/09/2022, 10:11 AM

## 2022-05-09 NOTE — Progress Notes (Signed)
PROGRESS NOTE                                                                                                                                                                                                             Patient Demographics:    Barbara [name], is a 81 y.o. female, DOB - [birth year], RCV:[id number]  Outpatient Primary MD for the patient is Laurey Morale, MD    LOS - 6  Admit date - 05/03/2022    Chief Complaint  Patient presents with   Chest Pain   Palpitations       Brief Narrative (HPI from H&P)   81 year old female with pertinent PMH of HTN, HLD, CAD with stent of LAD and 2000, paroxysmal A-fib on Eliquis, OSA on nasal CPAP presents to [facility] on 6/30 with chest pain/shortness of breath, she was diagnosed with NSTEMI, she was seen by PCCM and cardiology placed on heparin drip.  Currently stable transferred to my care on 05/05/2022   Subjective:   Patient in bed, appears comfortable, denies any headache, no fever, no chest pain or pressure, no shortness of breath , no abdominal pain. No new focal weakness.   Assessment  & Plan :     NSTEMI with tachybradycardia syndrome.  Currently chest pain-free, on heparin drip, had received Toprol-XL causing some bradycardia after which she briefly required isoproterenol drip.  She was seen by cardiology underwent left heart cath which showed nonobstructing CAD, currently on medical management with combination of aspirin, statin, hydralazine and Imdur.  Pain-free.  Beta-blocker being avoided due to tachybradycardia syndrome.   2.  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  As in #1 above.  EP also following, underwent pacemaker placement on 05/08/2022, her home dose Eliquis was transitioned to heparin drip for NSTEMI and left heart cath, anticoagulation will be resumed per EP.  She has also been started on beta-blocker  and Tikosyn.  Continue to monitor QTc and on telemetry.  Defer management of cardiac issues to the EP team.  3.  GERD.  On PPI.  4.  Essential hypertension.  Placed on combination of hydralazine and Imdur.  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  5.  Dyslipidemia.  On statin.  6.  Mild AKI on CKD 3 a baseline creatinine close to  1.5.  Hydration back to baseline.  7.  Mild nonspecific leukocytosis.  Afebrile, stable UA and chest x-ray.  This problem has resolved.      Condition - Fair  Family Communication  : None present  Code Status :  Full  Consults  :  Cards/EP, PCCM  PUD Prophylaxis : PPI   Procedures  :     TTE - 1. Left ventricular ejection fraction, by estimation, is >75%. The left ventricle has hyperdynamic function. The left ventricle has no regional wall motion abnormalities. Left ventricular diastolic parameters are indeterminate. Elevated left atrial pressure.  2. Right ventricular systolic function is normal. The right ventricular size is normal. There is mildly elevated pulmonary artery systolic pressure.  3. Left atrial size was moderately dilated.  4. The mitral valve is normal in structure. Moderate mitral valve regurgitation. No evidence of mitral stenosis.  5. The aortic valve is tricuspid. Aortic valve regurgitation is not visualized. Aortic valve sclerosis is present, with no evidence of aortic valve stenosis.  6. The inferior vena cava is normal in size with greater than 50% respiratory variability, suggesting right atrial pressure of 3 mmHg.      Disposition Plan  :    Status is: Inpatient  DVT Prophylaxis  :  Hep gtt >> Eliquis  SCDs Start: 05/08/22 1901 Place and maintain sequential compression device Start: 05/08/22 0648 SCD's Start: 05/06/22 1210 SCDs Start: 05/03/22 1602     Lab Results  Component Value Date   PLT 234 05/09/2022    Diet :  Diet Order             Diet Heart Room service appropriate? Yes; Fluid consistency: Thin  Diet effective now                     Inpatient Medications  Scheduled Meds:  aspirin  81 mg Oral Daily   atropine  1 mg Intravenous Once   bisacodyl  10 mg Oral Once   Chlorhexidine Gluconate Cloth  6 each Topical Q0600   dofetilide  125 mcg Oral BID   hydrALAZINE  50 mg Oral Q8H   isosorbide mononitrate  30 mg Oral Daily   metoprolol succinate  25 mg Oral Daily   pantoprazole  40 mg Oral Daily   polyethylene glycol  17 g Oral BID   rosuvastatin  40 mg Oral QHS   sodium chloride flush  3 mL Intravenous Q12H   Continuous Infusions:  sodium chloride      ceFAZolin (ANCEF) IV Stopped (05/09/22 0354)   PRN Meds:.sodium chloride, acetaminophen, docusate sodium, hydrALAZINE, lip balm, nitroGLYCERIN  Time Spent in minutes  30   Lala Lund M.D on 05/09/2022 at 8:45 AM  To page go to [email or web address]   [facility] -  Office  [phone]  See all Orders from today for further details    Objective:   Vitals:   05/09/22 0400 05/09/22 0500 05/09/22 0607 05/09/22 0700  BP: (!) 155/63 (!) 137/56 (!) 145/63   Pulse: 88 83 83 91  Resp: '20 16 17 20  '$ Temp:      TempSrc:      SpO2: 94% (!) 89% 94% 94%  Weight:  82.4 kg    Height:  '5\' 7"'$  (1.702 m)      Wt Readings from Last 3 Encounters:  05/09/22 82.4 kg  03/13/22 80.3 kg  01/08/22 79.4 kg     Intake/Output Summary (Last 24 hours) at 05/09/2022 0845 Last data filed  at 05/09/2022 0500 Gross per 24 hour  Intake 1772.07 ml  Output 1460 ml  Net 312.07 ml     Physical Exam  Awake Alert, No new F.N deficits, Normal affect, L arm in sling [place].AT,PERRAL Supple Neck, No JVD,   Symmetrical Chest wall movement, Good air movement bilaterally, CTAB RRR,No Gallops, Rubs or new Murmurs,  +ve B.Sounds, Abd Soft, No tenderness,   No Cyanosis, Clubbing or edema      Data Review:    CBC Recent Labs  Lab 05/03/22 1259 05/04/22 0318 05/05/22 0044 05/06/22 0629 05/07/22 0101 05/08/22 0135 05/09/22 0714  WBC 8.5   < > 11.8* 8.6  7.5 7.7 8.2  HGB 12.3   < > 12.7 13.1 12.0 12.6 12.6  HCT 38.2   < > 37.3 40.6 36.0 38.6 39.4  PLT 177   < > 188 200 183 192 234  MCV 90.7   < > 87.1 87.9 88.7 89.4 88.1  MCH 29.2   < > 29.7 28.4 29.6 29.2 28.2  MCHC 32.2   < > 34.0 32.3 33.3 32.6 32.0  RDW 12.5   < > 12.7 12.6 12.8 12.8 13.0  LYMPHSABS 1.0  --   --   --   --   --   --   MONOABS 0.5  --   --   --   --   --   --   EOSABS 0.0  --   --   --   --   --   --   BASOSABS 0.0  --   --   --   --   --   --    < > = values in this interval not displayed.    Electrolytes Recent Labs  Lab 05/04/22 0318 05/06/22 0629 05/07/22 0101 05/08/22 0135 05/08/22 1909 05/09/22 0714  NA 141 138 140 138  --  137  K 4.2 3.8 4.2 4.3  --  4.6  CL 106 109 109 103  --  104  CO2 23 20* 23 23  --  21*  GLUCOSE 116* 103* 114* 109*  --  145*  BUN 24* '21 23 23  '$ --  29*  CREATININE 1.58* 1.54* 1.58* 1.80*  --  1.57*  CALCIUM 10.1 10.0 9.7 10.3  --  10.1  AST 26  --   --   --   --   --   ALT 16  --   --   --   --   --   ALKPHOS 66  --   --   --   --   --   BILITOT 0.8  --   --   --   --   --   ALBUMIN 3.7  --   --   --   --   --   MG 2.0 1.8 1.7 1.7 2.3 2.2  BNP  --  639.3* 636.3* 520.4*  --   --     Radiology Reports DG Chest 2 View  Result Date: 05/09/2022 CLINICAL DATA:  [id number], encounter for cardiac device in-situ EXAM: CHEST - 2 VIEW COMPARISON:  May 03, 2022 FINDINGS: There has been interval left subclavian dual lead pacemaker insertion with 1 of the leads in the right atrium and the second in the right ventricle. No pneumothorax. Chest leads in place. Mild cardiomegaly. Mild central pulmonary vascular congestion. No consolidation or pleural effusion. Right shoulder prosthesis. IMPRESSION: There has been interval insertion of left subclavian dual lead pacemaker. No  pneumothorax. Cardiomegaly. Mild central pulmonary vascular congestion. Electronically Signed   By: Frazier Richards M.D.   On: 05/09/2022 08:07   EP PPM/ICD IMPLANT  Result  Date: 05/08/2022 SURGEON:  Allegra Lai, MD   PREPROCEDURE DIAGNOSIS:  sick sinus syndrome   POSTPROCEDURE DIAGNOSIS:  sick sinus syndrome    PROCEDURES:  1. Pacemaker implantation.   INTRODUCTION:  Barbara [name] is a 81 y.o. female with a history of bradycardia who presents today for pacemaker implantation.  The patient reports intermittent episodes of dizziness over the past few months.  No reversible causes have been identified.  The patient therefore presents today for pacemaker implantation.   DESCRIPTION OF PROCEDURE:  Informed written consent was obtained, and  the patient was brought to the electrophysiology lab in a fasting state.  The patient required no sedation for the procedure today.  The patients left chest was prepped and draped in the usual sterile fashion by the EP lab staff. The skin overlying the left deltopectoral region was infiltrated with lidocaine for local analgesia.  A 4-cm incision was made over the left deltopectoral region.  A left subcutaneous pacemaker pocket was fashioned using a combination of sharp and blunt dissection. Electrocautery was required to assure hemostasis.    RA/RV Lead Placement: The left axillary vein was therefore cannulated.  Through the left axillary vein, a Medtronic CapSureFix Novus [id number]  (serial number  [id number]) right atrial lead and an Medtronic SelectSure [id number] (serial number  [id number]) right ventricular lead were advanced with fluoroscopic visualization into the right atrial appendage and left bundle area positions respectively.  Initial atrial lead P- waves measured 2 mV with impedance of 1096 ohms and a threshold of 1.5 V at 0.5 msec.  Right ventricular lead R-waves measured 6 mV with an impedance of 1204 ohms and a threshold of 1.5 V at 0.5 msec.  Both leads were secured to the pectoralis fascia using #2-0 silk over the suture sleeves. Device Placement:  The leads were then connected to an Medtronic Azure XT DR [id number]   (serial number   [id number] ) pacemaker.  The pocket was irrigated with copious gentamicin solution.  The pacemaker was then placed into the pocket.  The pocket was then closed in 3 layers with 2.0 Vicryl suture for the 3.0 Vicryl suture subcutaneous and subcuticular layers.  Steri-  Strips and a sterile dressing were then applied. EBL<18m.  There were no early apparent complications.   CONCLUSIONS:  1. Successful implantation of a St Jude Medical Assurity MRI dual-chamber pacemaker for symptomatic bradycardia  2. No early apparent complications.       Will CCurt Bears MD 05/08/2022 4:59 PM  DG Abd Portable 1V  Result Date: 05/08/2022 CLINICAL DATA:  Nausea EXAM: PORTABLE ABDOMEN - 1 VIEW COMPARISON:  Radiograph 09/13/2021 FINDINGS: There is no evidence of bowel obstruction. Mild-to-moderate stool burden. No radiopaque calculi overlie the kidneys. Lower lumbar spine degenerative changes. No acute osseous abnormality. IMPRESSION: No evidence of bowel obstruction.  Mild-to-moderate stool burden. Electronically Signed   By: JMaurine SimmeringM.D.   On: 05/08/2022 12:19   CARDIAC CATHETERIZATION  Result Date: 05/06/2022 Conclusions: Mild-moderate, non-obstructive coronary artery disease. Patent proximal/mid LAD stent with up to 30% in-stent restenosis. Low left ventricular filling pressure (LVEDP ~5 mmHg). Recommendations: Continue secondary prevention of coronary artery disease. Proceed with pacemaker placement per Dr. KCaryl Comes CNelva Bush MD [facility]

## 2022-05-09 NOTE — Progress Notes (Signed)
Pharmacy: Dofetilide (Tikosyn) - Initial Consult Assessment and Electrolyte Replacement  Pharmacy consulted to assist in monitoring and replacing electrolytes in this 81 y.o. female admitted on 05/03/2022 undergoing dofetilide re-initiation. First dofetilide dose: 05/08/2022 at 9pm  Assessment:  Patient Exclusion Criteria: If any screening criteria checked as "Yes", then  patient  should NOT receive dofetilide until criteria item is corrected.  If "Yes" please indicate correction plan.  YES  NO Patient  Exclusion Criteria Correction Plan   '[]'$   '[x]'$   Baseline QTc interval is greater than or equal to 440 msec. IF above YES box checked dofetilide contraindicated unless patient has ICD; then may proceed if QTc 500-550 msec or with known ventricular conduction abnormalities may proceed with QTc 550-600 msec. QTc = 0.51    '[]'$   '[x]'$   Patient is known or suspected to have a digoxin level greater than 2 ng/ml: No results found for: "DIGOXIN"     '[]'$   '[x]'$   Creatinine clearance less than 20 ml/min (calculated using Cockcroft-Gault, actual body weight and serum creatinine): Estimated Creatinine Clearance: 30 mL/min (A) (by C-G formula based on SCr of 1.57 mg/dL (H)).     '[]'$   '[x]'$  Patient has received drugs known to prolong the QT intervals within the last 48 hours (phenothiazines, tricyclics or tetracyclic antidepressants, erythromycin, H-1 antihistamines, cisapride, fluoroquinolones, azithromycin, ondansetron).   Updated information on QT prolonging agents is available to be searched on the following database:QT prolonging agents     '[]'$   '[x]'$   Patient received a dose of hydrochlorothiazide (Oretic) alone or in any combination including triamterene (Dyazide, Maxzide) in the last 48 hours.    '[]'$   '[x]'$  Patient received a medication known to increase dofetilide plasma concentrations prior to initial dofetilide dose:  Trimethoprim (Primsol, Proloprim) in the last 36 hours Verapamil (Calan, Verelan)  in the last 36 hours or a sustained release dose in the last 72 hours Megestrol (Megace) in the last 5 days  Cimetidine (Tagamet) in the last 6 hours Ketoconazole (Nizoral) in the last 24 hours Itraconazole (Sporanox) in the last 48 hours  Prochlorperazine (Compazine) in the last 36 hours     '[]'$   '[x]'$   Patient is known to have a history of torsades de pointes; congenital or acquired long QT syndromes.    '[]'$   '[x]'$   Patient has received a Class 1 antiarrhythmic with less than 2 half-lives since last dose. (Disopyramide, Quinidine, Procainamide, Lidocaine, Mexiletine, Flecainide, Propafenone)    '[]'$   '[x]'$   Patient has received amiodarone therapy in the past 3 months or amiodarone level is greater than 0.3 ng/ml.    Patient has been appropriately anticoagulated with apixaban PTA - on hold for PPM placement today - patient remains in SR  - restart dofetilide per EP.  Labs:    Component Value Date/Time   K 4.6 05/09/2022 0714   MG 2.2 05/09/2022 5329     Plan: Potassium: K >/= 4: Appropriate to initiate Tikosyn, no replacement needed    Magnesium: Magnesium 2.2, at goal of >2  Thank you for allowing pharmacy to participate in this patient's care.  Reatha Harps, PharmD PGY1 Pharmacy Resident 05/09/2022 8:53 AM Check [email or web address] for unit specific pharmacy number

## 2022-05-09 NOTE — Progress Notes (Signed)
Physical Therapy Treatment Patient Details Name: Barbara [name] MRN: [id number] DOB: 29-Aug-1941 Today's Date: 05/09/2022   History of Present Illness Pt is an 81 y.o. female admitted 05/03/22 with chest pain, bradycardia; workup for NSTEMI. S/p pacemaker insertion 7/5. PMH includes PAF, hypertrophic cardiomyopathy, CAD, HTN, HLD, OSA.   PT Comments    Pt progressing with mobility. Today's session focused on transfer and gait training s/p pacemaker; pt demonstrates good awareness of LUE precautions; pt ambulatory with SPC and min guard for balance. Pt reports plan for initial increase in family support upon return home. Will continue to follow acutely to address established goals.   Post-ambulation BP 153/56, HR 83    Recommendations for follow up therapy are one component of a multi-disciplinary discharge planning process, led by the attending physician.  Recommendations may be updated based on patient status, additional functional criteria and insurance authorization.  Follow Up Recommendations  Home health PT     Assistance Recommended at Discharge Intermittent Supervision/Assistance  Patient can return home with the following A little help with bathing/dressing/bathroom;Assist for transportation;Help with stairs or ramp for entrance;Assistance with cooking/housework   Equipment Recommendations  None recommended by PT    Recommendations for Other Services       Precautions / Restrictions Precautions Precautions: Fall;ICD/Pacemaker     Mobility  Bed Mobility Overal bed mobility: Needs Assistance Bed Mobility: Supine to Sit, Sit to Supine     Supine to sit: Modified independent (Device/Increase time), HOB elevated Sit to supine: Min assist   General bed mobility comments: mod indep to come to sitting EOB with HOB elevated, good awareness to minimize use of LUE; minA for LLE management return to supine    Transfers Overall transfer level: Needs assistance Equipment  used: 1 person hand held assist Transfers: Sit to/from Stand Sit to Stand: Min assist           General transfer comment: pt requesting HHA to elevate trunk to stand from EOB, minA for this    Ambulation/Gait Ambulation/Gait assistance: Min guard Gait Distance (Feet): 140 Feet Assistive device: None, Straight cane Gait Pattern/deviations: Step-through pattern, Decreased stride length, Trunk flexed Gait velocity: Decreased     General Gait Details: slow, guarded gait initially without DME, min guard for balance; pt later grabbing SPC to use due to R knee pain, stability improved; pt declined hallway ambulation, opting to walk laps in room; declines further distance secondary to fatigue   Stairs             Wheelchair Mobility    Modified Rankin (Stroke Patients Only)       Balance Overall balance assessment: Needs assistance Sitting-balance support: Feet supported, No upper extremity supported Sitting balance-Leahy Scale: Good     Standing balance support: No upper extremity supported, During functional activity, Single extremity supported Standing balance-Leahy Scale: Fair Standing balance comment: can static stand and walk without UE support                            Cognition Arousal/Alertness: Awake/alert Behavior During Therapy: WFL for tasks assessed/performed Overall Cognitive Status: Within Functional Limits for tasks assessed                                          Exercises      General Comments General comments (skin integrity, edema, etc.):  reviewed pacemaker precautions, pt demonstrates good awareness of this. HR 80s; post-ambulation BP 153/56 via R forearm. discussed safe d/c home, pt to have increased assist for first few days      Pertinent Vitals/Pain Pain Assessment Pain Assessment: Faces Faces Pain Scale: Hurts a little bit Pain Location: L shoulder Pain Descriptors / Indicators: Sore Pain  Intervention(s): Monitored during session    Home Living                          Prior Function            PT Goals (current goals can now be found in the care plan section) Progress towards PT goals: Progressing toward goals    Frequency    Min 3X/week      PT Plan Current plan remains appropriate    Co-evaluation              AM-PAC PT "6 Clicks" Mobility   Outcome Measure  Help needed turning from your back to your side while in a flat bed without using bedrails?: A Little Help needed moving from lying on your back to sitting on the side of a flat bed without using bedrails?: A Little Help needed moving to and from a bed to a chair (including a wheelchair)?: A Little Help needed standing up from a chair using your arms (e.g., wheelchair or bedside chair)?: A Little Help needed to walk in hospital room?: A Little Help needed climbing 3-5 steps with a railing? : A Little 6 Click Score: 18    End of Session Equipment Utilized During Treatment: Gait belt Activity Tolerance: Patient tolerated treatment well Patient left: in bed;with call bell/phone within reach Nurse Communication: Mobility status PT Visit Diagnosis: Unsteadiness on feet (R26.81);Muscle weakness (generalized) (M62.81);Difficulty in walking, not elsewhere classified (R26.2)     Time: 1497-0263 PT Time Calculation (min) (ACUTE ONLY): 20 min  Charges:  $Gait Training: 8-22 mins                     Mabeline Caras, PT, DPT Acute Rehabilitation Services  Personal: [place] Office: [place] 05/09/2022, 4:20 PM

## 2022-05-09 NOTE — Progress Notes (Addendum)
Progress Note  Patient Name: Barbara [name] Date of Encounter: 05/09/2022  [facility] Cardiologist: Shelva Majestic, MD   Subjective   Feels well currently, denies pain, does not want pain maeds.  Inpatient Medications    Scheduled Meds:  aspirin  81 mg Oral Daily   atropine  1 mg Intravenous Once   bisacodyl  10 mg Oral Once   Chlorhexidine Gluconate Cloth  6 each Topical Q0600   dofetilide  125 mcg Oral BID   hydrALAZINE  50 mg Oral Q8H   isosorbide mononitrate  30 mg Oral Daily   metoprolol succinate  25 mg Oral Daily   pantoprazole  40 mg Oral Daily   polyethylene glycol  17 g Oral BID   rosuvastatin  40 mg Oral QHS   sodium chloride flush  3 mL Intravenous Q12H   sodium chloride flush  3 mL Intravenous Q12H   Continuous Infusions:  sodium chloride     sodium chloride 10 mL/hr at 05/09/22 0500    ceFAZolin (ANCEF) IV Stopped (05/09/22 0354)   PRN Meds: sodium chloride, sodium chloride, acetaminophen, acetaminophen, docusate sodium, hydrALAZINE, lip balm, nitroGLYCERIN, sodium chloride flush   Vital Signs    Vitals:   05/09/22 0400 05/09/22 0500 05/09/22 0607 05/09/22 0700  BP: (!) 155/63 (!) 137/56 (!) 145/63   Pulse: 88 83 83 91  Resp: '20 16 17 20  '$ Temp:      TempSrc:      SpO2: 94% (!) 89% 94% 94%  Weight:  82.4 kg    Height:  '5\' 7"'$  (1.702 m)      Intake/Output Summary (Last 24 hours) at 05/09/2022 0810 Last data filed at 05/09/2022 0500 Gross per 24 hour  Intake 1772.07 ml  Output 1460 ml  Net 312.07 ml      05/09/2022    5:00 AM 05/08/2022    4:25 AM 05/07/2022    5:00 AM  Last 3 Weights  Weight (lbs) 181 lb 10.5 oz 175 lb 14.8 oz 176 lb 11.2 oz  Weight (kg) 82.4 kg 79.8 kg 80.151 kg      Telemetry    SR 70's-90s, PACs,- Personally Reviewed   ECG    SR 86bpm, QTc 463m - Personally Reviewed  Physical Exam   GEN: No acute distress.   Neck: No JVD Cardiac: RRR, no murmurs, rubs, or gallops.  Respiratory: CTA b/l. GI: Soft,  nontender, non-distended  MS: No edema; No deformity. Neuro:  Nonfocal  Psych: Normal affect   Pacer site is stable, no b leeding, no hematoma  Labs    High Sensitivity Troponin:   Recent Labs  Lab 05/03/22 1206 05/03/22 1346 05/03/22 1545 05/03/22 1745 05/04/22 0009  TROPONINIHS 569* 801* 1,052* 1,115* 521*     Chemistry Recent Labs  Lab 05/04/22 0318 05/06/22 0629 05/07/22 0101 05/08/22 0135 05/08/22 1909 05/09/22 0714  NA 141   < > 140 138  --  137  K 4.2   < > 4.2 4.3  --  4.6  CL 106   < > 109 103  --  104  CO2 23   < > 23 23  --  21*  GLUCOSE 116*   < > 114* 109*  --  145*  BUN 24*   < > 23 23  --  29*  CREATININE 1.58*   < > 1.58* 1.80*  --  1.57*  CALCIUM 10.1   < > 9.7 10.3  --  10.1  MG 2.0   < >  1.7 1.7 2.3 2.2  PROT 6.5  --   --   --   --   --   ALBUMIN 3.7  --   --   --   --   --   AST 26  --   --   --   --   --   ALT 16  --   --   --   --   --   ALKPHOS 66  --   --   --   --   --   BILITOT 0.8  --   --   --   --   --   GFRNONAA 32*   < > 32* 28*  --  33*  ANIONGAP 12   < > 8 12  --  12   < > = values in this interval not displayed.    Lipids No results for input(s): "CHOL", "TRIG", "HDL", "LABVLDL", "LDLCALC", "CHOLHDL" in the last 168 hours.  Hematology Recent Labs  Lab 05/07/22 0101 05/08/22 0135 05/09/22 0714  WBC 7.5 7.7 8.2  RBC 4.06 4.32 4.47  HGB 12.0 12.6 12.6  HCT 36.0 38.6 39.4  MCV 88.7 89.4 88.1  MCH 29.6 29.2 28.2  MCHC 33.3 32.6 32.0  RDW 12.8 12.8 13.0  PLT 183 192 234   Thyroid No results for input(s): "TSH", "FREET4" in the last 168 hours.  BNP Recent Labs  Lab 05/06/22 0629 05/07/22 0101 05/08/22 0135  BNP 639.3* 636.3* 520.4*    DDimer No results for input(s): "DDIMER" in the last 168 hours.   Radiology      Cardiac Studies   05/04/22: TTE  1. Left ventricular ejection fraction, by estimation, is >75%. The left  ventricle has hyperdynamic function. The left ventricle has no regional  wall motion  abnormalities. Left ventricular diastolic parameters are  indeterminate. Elevated left atrial  pressure.   2. Right ventricular systolic function is normal. The right ventricular  size is normal. There is mildly elevated pulmonary artery systolic  pressure.   3. Left atrial size was moderately dilated.   4. The mitral valve is normal in structure. Moderate mitral valve  regurgitation. No evidence of mitral stenosis.   5. The aortic valve is tricuspid. Aortic valve regurgitation is not  visualized. Aortic valve sclerosis is present, with no evidence of aortic  valve stenosis.   6. The inferior vena cava is normal in size with greater than 50%  respiratory variability, suggesting right atrial pressure of 3 mmHg.   10/08/2020; TTE  1. Left ventricular ejection fraction, by estimation, is 70 to 75%. The  left ventricle has hyperdynamic function. The left ventricle has no  regional wall motion abnormalities. There is moderate eccentric left  ventricular hypertrophy of the apical  segment. Left ventricular diastolic parameters are indeterminate. Elevated  left ventricular end-diastolic pressure.   2. Right ventricular systolic function is normal. The right ventricular  size is normal. Tricuspid regurgitation signal is inadequate for assessing  PA pressure.   3. The mitral valve is abnormal. Mild mitral valve regurgitation.  Moderate mitral annular calcification.   4. The aortic valve is grossly normal. There is mild calcification of the  aortic valve. There is mild thickening of the aortic valve. Aortic valve  regurgitation is not visualized. Mild aortic valve sclerosis is present,  with no evidence of aortic valve  stenosis.   Comparison(s): No significant change from prior study.     02/26/2019: LHC Prox RCA to Mid RCA  lesion is 15% stenosed. Ost LAD to Prox LAD lesion is 20% stenosed. Prox Cx lesion is 20% stenosed.   Mild non-obstructive coronary artery disease with a patent  proximal LAD stent with mild 20% intimal hyperplasia (inserted  09/1999); 20% proximal left circumflex narrowing, and mild irregularity of 10 to 15% in the mid RCA.   Hyperdynamic LV function with an "Ace of Spade "configuration and near cavity obliteration in the mid to apical segment during systole.  There is evidence for left ventricular hypertrophy.  LVEDP is 16 mm.     Patient Profile      81 y.o. female w/PMHx of AFib, apical hypertrophic cardiomyopathy, coronary artery disease with history of PCI of the LAD, hypertension, hyperlipidemia, OSA, CKD (III), initially admitted to [facility] with CP, w/concerns of NSTEMI got a dose of BB resulting in development of marked bradycardia > glucagon, atropine and isuprel with improvement  Planned for cath, though suspect 2/2 demand ischemia 2/2 RVR on presentation EP consult for tachy-brady  Assessment & Plan    Paroxysmal Afib  CHA2DS2Vasc is at least 5, on eliquis at home, held for procedures Home Tikosyn stopped on admission  Tikosyn resumed/re-load is in progress K+ 4.6 Mag 2.2 Creat 1.57 (stable) QTc stable  Plan for eliquis tomorrow AM if pocket remains stable Start Toprol today   Tachy-brady Historically has had her nodal blocking agents stopped 2/2 bradycardia as well. Maintained on Tikosyn until this admission  S/p PPM yesterday Site looks good Device check this AM with intact function, stable measurements CXR this AM with table lead position, no ptx Wound care and activity restrictions were reviewed with the patient Plan for Eliquis tomorrow (Friday) AM  3. CP 4. CAD Not an ongoing complaint HS Trop peaked at 1115 > last 521 Cath this admission with no obstructive disease  5. Apical HCM Not felt to be volume OL recommended for out patient referral for genetics consult   Encourage OOB Transfer to 6e (Ordered yesterday)    For questions or updates, please contact [place] Please consult [email or web address] for  contact info under        Signed, Baldwin [place], PA-C  05/09/2022, 8:10 AM    I have seen and examined this patient with Tommye Standard.  Agree with above, note added to reflect my findings.  Feeling well. 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 Device functioning appropriately. CXR without issue. Damarcus Reggio arrange device follow up. Paroxysmal atrial fibrillation: Tikosyn load in progress. QTc has remained stable. Continue current management.    Kym Fenter M. Meiah Zamudio MD 05/09/2022 8:41 AM

## 2022-05-10 ENCOUNTER — Other Ambulatory Visit (HOSPITAL_COMMUNITY): Payer: Self-pay

## 2022-05-10 DIAGNOSIS — I480 Paroxysmal atrial fibrillation: Secondary | ICD-10-CM | POA: Diagnosis not present

## 2022-05-10 DIAGNOSIS — I4891 Unspecified atrial fibrillation: Secondary | ICD-10-CM | POA: Diagnosis not present

## 2022-05-10 LAB — MAGNESIUM: Magnesium: 2.3 mg/dL (ref 1.7–2.4)

## 2022-05-10 LAB — CBC WITH DIFFERENTIAL/PLATELET
Abs Immature Granulocytes: 0.05 10*3/uL (ref 0.00–0.07)
Basophils Absolute: 0 10*3/uL (ref 0.0–0.1)
Basophils Relative: 0 %
Eosinophils Absolute: 0.1 10*3/uL (ref 0.0–0.5)
Eosinophils Relative: 1 %
HCT: 34.4 % — ABNORMAL LOW (ref 36.0–46.0)
Hemoglobin: 11.3 g/dL — ABNORMAL LOW (ref 12.0–15.0)
Immature Granulocytes: 1 %
Lymphocytes Relative: 16 %
Lymphs Abs: 1.4 10*3/uL (ref 0.7–4.0)
MCH: 28.9 pg (ref 26.0–34.0)
MCHC: 32.8 g/dL (ref 30.0–36.0)
MCV: 88 fL (ref 80.0–100.0)
Monocytes Absolute: 1.1 10*3/uL — ABNORMAL HIGH (ref 0.1–1.0)
Monocytes Relative: 12 %
Neutro Abs: 6.3 10*3/uL (ref 1.7–7.7)
Neutrophils Relative %: 70 %
Platelets: 188 10*3/uL (ref 150–400)
RBC: 3.91 MIL/uL (ref 3.87–5.11)
RDW: 12.9 % (ref 11.5–15.5)
WBC: 8.9 10*3/uL (ref 4.0–10.5)
nRBC: 0 % (ref 0.0–0.2)

## 2022-05-10 LAB — BASIC METABOLIC PANEL
Anion gap: 9 (ref 5–15)
BUN: 41 mg/dL — ABNORMAL HIGH (ref 8–23)
CO2: 22 mmol/L (ref 22–32)
Calcium: 9.4 mg/dL (ref 8.9–10.3)
Chloride: 104 mmol/L (ref 98–111)
Creatinine, Ser: 1.64 mg/dL — ABNORMAL HIGH (ref 0.44–1.00)
GFR, Estimated: 31 mL/min — ABNORMAL LOW (ref >60)
Glucose, Bld: 110 mg/dL — ABNORMAL HIGH (ref 70–99)
Potassium: 5 mmol/L (ref 3.5–5.1)
Sodium: 135 mmol/L (ref 135–145)

## 2022-05-10 MED ORDER — AMIODARONE HCL 200 MG PO TABS
200.0000 mg | ORAL_TABLET | Freq: Two times a day (BID) | ORAL | 0 refills | Status: DC
  Filled 2022-05-10: qty 60, 30d supply, fill #0

## 2022-05-10 MED ORDER — ASPIRIN 81 MG PO CHEW
81.0000 mg | CHEWABLE_TABLET | Freq: Every day | ORAL | 0 refills | Status: DC
  Filled 2022-05-10: qty 30, 30d supply, fill #0

## 2022-05-10 MED ORDER — POLYETHYLENE GLYCOL 3350 17 GM/SCOOP PO POWD
17.0000 g | Freq: Every day | ORAL | 0 refills | Status: DC
  Filled 2022-05-10: qty 238, 14d supply, fill #0

## 2022-05-10 MED ORDER — APIXABAN 2.5 MG PO TABS
2.5000 mg | ORAL_TABLET | Freq: Two times a day (BID) | ORAL | Status: DC
  Administered 2022-05-10: 2.5 mg via ORAL
  Filled 2022-05-10: qty 1

## 2022-05-10 MED ORDER — AMIODARONE HCL 200 MG PO TABS
200.0000 mg | ORAL_TABLET | Freq: Two times a day (BID) | ORAL | Status: DC
  Administered 2022-05-10: 200 mg via ORAL
  Filled 2022-05-10: qty 1

## 2022-05-10 MED ORDER — METOPROLOL SUCCINATE ER 25 MG PO TB24
25.0000 mg | ORAL_TABLET | Freq: Every day | ORAL | 0 refills | Status: DC
  Filled 2022-05-10: qty 30, 30d supply, fill #0

## 2022-05-10 MED ORDER — SODIUM ZIRCONIUM CYCLOSILICATE 5 G PO PACK
5.0000 g | PACK | Freq: Once | ORAL | Status: AC
Start: 2022-05-10 — End: 2022-05-10
  Administered 2022-05-10: 5 g via ORAL
  Filled 2022-05-10: qty 1

## 2022-05-10 MED ORDER — ISOSORBIDE MONONITRATE ER 30 MG PO TB24
30.0000 mg | ORAL_TABLET | Freq: Every day | ORAL | 0 refills | Status: DC
  Filled 2022-05-10: qty 30, 30d supply, fill #0

## 2022-05-10 MED ORDER — AMIODARONE HCL 200 MG PO TABS: 200.0000 mg | ORAL_TABLET | Freq: Every day | ORAL | Status: DC

## 2022-05-10 MED ORDER — LACTULOSE 10 GM/15ML PO SOLN
20.0000 g | Freq: Four times a day (QID) | ORAL | Status: DC
  Administered 2022-05-10: 20 g via ORAL
  Filled 2022-05-10: qty 30

## 2022-05-10 MED ORDER — HYDRALAZINE HCL 50 MG PO TABS
50.0000 mg | ORAL_TABLET | Freq: Three times a day (TID) | ORAL | 0 refills | Status: DC
  Filled 2022-05-10: qty 90, 30d supply, fill #0

## 2022-05-10 NOTE — Care Management Important Message (Signed)
Important Message  Patient Details  Name: DELENE MORAIS MRN: [id number] Date of Birth: 1941/06/12   Medicare Important Message Given:  Yes     Shelda Altes 05/10/2022, 10:26 AM

## 2022-05-10 NOTE — Plan of Care (Signed)
  Problem: Education: Goal: Knowledge of General Education information will improve Description: Including pain rating scale, medication(s)/side effects and non-pharmacologic comfort measures Outcome: Progressing   Problem: Health Behavior/Discharge Planning: Goal: Ability to manage health-related needs will improve Outcome: Progressing   Problem: Clinical Measurements: Goal: Cardiovascular complication will be avoided Outcome: Progressing   Problem: Elimination: Goal: Will not experience complications related to bowel motility Outcome: Progressing   Problem: Pain Managment: Goal: General experience of comfort will improve Outcome: Progressing   Problem: Safety: Goal: Ability to remain free from injury will improve Outcome: Progressing   Problem: Skin Integrity: Goal: Risk for impaired skin integrity will decrease Outcome: Progressing   Problem: Cardiac: Goal: Ability to achieve and maintain adequate cardiovascular perfusion will improve Outcome: Progressing   Problem: Cardiovascular: Goal: Ability to achieve and maintain adequate cardiovascular perfusion will improve Outcome: Progressing Goal: Vascular access site(s) Level 0-1 will be maintained Outcome: Progressing

## 2022-05-10 NOTE — Discharge Summary (Signed)
Barbara [name] TAV:[id number] DOB: 12-Apr-1941 DOA: 05/03/2022  PCP: Laurey Morale, MD  Admit date: 05/03/2022  Discharge date: 05/10/2022  Admitted From: Home   Disposition:  Home   Recommendations for Outpatient Follow-up:   Follow up with PCP in 1-2 weeks  PCP Please obtain BMP/CBC, 2 view CXR in 1week,  (see Discharge instructions)   PCP Please follow up on the following pending results: Monitor blood pressure, BMP closely needs close outpatient follow-up with cardiology and EP postdischarge.   Home Health: PT, OT, RN, aide if she qualifies Equipment/Devices: As below Consultations: Cards Discharge Condition: Stable    CODE STATUS: Full    Diet Recommendation: Heart Healthy     Chief Complaint  Patient presents with   Chest Pain   Palpitations     Brief history of present illness from the day of admission and additional interim summary    81 year old female with pertinent PMH of HTN, HLD, CAD with stent of LAD and 2000, paroxysmal A-fib on Eliquis, OSA on nasal CPAP presents to [facility] on 6/30 with chest pain/shortness of breath, she was diagnosed with NSTEMI, she was seen by PCCM and cardiology placed on heparin drip.  Currently stable transferred to my care on 05/05/2022                                                                 Hospital Course   NSTEMI with tachybradycardia syndrome.   She was seen by cardiology underwent left heart cath which showed nonobstructing CAD, currently on medical management with combination of aspirin, statin, beta-blocker, hydralazine and Imdur.  Pain-free.      2.  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  As in #1 above.  EP also following, underwent pacemaker placement on 05/08/2022, her home dose Eliquis was  transitioned to heparin drip for NSTEMI and left heart cath, anticoagulation will be resumed per EP.  She has also been started on beta-blocker and was challenged with reloading of Tikosyn unfortunately her QTc prolonged and Tikosyn was withdrawn, per cardiology continue beta-blocker and discharged home with outpatient PCP and cardiology follow-up.  Instead of Tikosyn she has been started on amiodarone 200 twice daily for a month thereafter per cardiology.    3.  GERD.  On PPI.   4.  Essential hypertension.  Placed on combination of hydralazine and Imdur.  Lactone and ARB held due to AKI on CKD 3A.   5.  Dyslipidemia.  On statin.   6.  Mild AKI on CKD 3 a baseline creatinine close to 1.5.  Duration close to baseline avoiding nephrotoxins, PCP to monitor.   7.  Mild nonspecific leukocytosis.  Afebrile, stable UA and chest x-ray.  This problem has resolved.   Discharge diagnosis  Principal Problem:   Chest pain Active Problems:   Dyslipidemia   Essential hypertension   CAD (coronary artery disease)   Bradycardia   Gastroesophageal reflux disease   CHF (congestive heart failure) (HCC)   Demand myocardial infarction (HCC)   PAF (paroxysmal atrial fibrillation) (HCC)   CKD (chronic kidney disease), stage III (HCC)   Atrial fibrillation ([place])   Tachycardia-bradycardia syndrome [facility])    Discharge instructions    Discharge Instructions     Discharge instructions   Complete by: As directed    Follow with Primary MD Laurey Morale, MD in 7 days   Get CBC, CMP, Magnesium, Two view Chest X ray -  checked next visit within 1 week by Primary MD   Activity: As tolerated with Full fall precautions use walker/cane & assistance as needed  Disposition Home   Diet: Heart Healthy    Special Instructions: If you have smoked or chewed Tobacco  in the last 2 yrs please stop smoking, stop any regular Alcohol  and or any Recreational drug use.  On your next visit with your primary care  physician please Get Medicines reviewed and adjusted.  Please request your Prim.MD to go over all Hospital Tests and Procedure/Radiological results at the follow up, please get all Hospital records sent to your Prim MD by signing hospital release before you go home.  If you experience worsening of your admission symptoms, develop shortness of breath, life threatening emergency, suicidal or homicidal thoughts you must seek medical attention immediately by calling 911 or calling your MD immediately  if symptoms less severe.  You Must read complete instructions/literature along with all the possible adverse reactions/side effects for all the Medicines you take and that have been prescribed to you. Take any new Medicines after you have completely understood and accpet all the possible adverse reactions/side effects.   Increase activity slowly   Complete by: As directed    No wound care   Complete by: As directed        Discharge Medications   Allergies as of 05/10/2022       Reactions   Lidocaine Anaphylaxis   Morphine Other (See Comments)   "Body shuts down"   Procaine Hcl Anaphylaxis, Rash, Other (See Comments)   "Anything with 'caine' in it "   Sulfonamide Derivatives Hives   Norco [hydrocodone-acetaminophen] Other (See Comments)   "Made me sick"   Tramadol Nausea Only   Vytorin [ezetimibe-simvastatin] Other (See Comments)   Unknown   Tape Itching, Other (See Comments)   Patient prefers either paper tape or Coban wrap        Medication List     STOP taking these medications    amLODipine 5 MG tablet Commonly known as: NORVASC   amoxicillin 500 MG tablet Commonly known as: AMOXIL   candesartan 16 MG tablet Commonly known as: ATACAND   dofetilide 125 MCG capsule Commonly known as: TIKOSYN   spironolactone 25 MG tablet Commonly known as: ALDACTONE       TAKE these medications    acetaminophen 500 MG tablet Commonly known as: TYLENOL Take 500 mg by mouth daily  as needed for headache.   amiodarone 200 MG tablet Commonly known as: PACERONE Take 1 tablet (200 mg total) by mouth 2 (two) times daily.   apixaban 2.5 MG Tabs tablet Commonly known as: Eliquis Take 1 tablet (2.5 mg total) by mouth 2 (two) times daily.   aspirin 81 MG chewable tablet Chew 1 tablet (81 mg total)  by mouth daily. Start taking on: May 11, 2022   Blue-Emu Super Strength Crea Apply 1 Application topically daily.   dextromethorphan 30 MG/5ML liquid Commonly known as: DELSYM Take 15 mg by mouth daily as needed for cough.   docusate sodium 100 MG capsule Commonly known as: Colace Take 1 capsule (100 mg total) by mouth 2 (two) times daily. What changed:  when to take this reasons to take this   hydrALAZINE 50 MG tablet Commonly known as: APRESOLINE Take 1 tablet (50 mg total) by mouth every 8 (eight) hours.   ipratropium 0.06 % nasal spray Commonly known as: ATROVENT Place 2 sprays into both nostrils 2 (two) times daily.   isosorbide mononitrate 30 MG 24 hr tablet Commonly known as: IMDUR Take 1 tablet (30 mg total) by mouth daily. Start taking on: May 11, 2022   ketoconazole 2 % cream Commonly known as: NIZORAL Apply 1 application topically 2 (two) times daily as needed for irritation.   ketotifen 0.025 % ophthalmic solution Commonly known as: ZADITOR Place 1 drop into the right eye every morning.   loratadine 10 MG tablet Commonly known as: CLARITIN Take 10 mg by mouth daily as needed (allergies/sinus issues).   LORazepam 0.5 MG tablet Commonly known as: ATIVAN TAKE 1 TABLET BY MOUTH EVERY 6 HOURS AS NEEDED FOR ANXIETY What changed:  when to take this additional instructions   metoprolol succinate 25 MG 24 hr tablet Commonly known as: TOPROL-XL Take 1 tablet (25 mg total) by mouth daily. Start taking on: May 11, 2022   multivitamin with minerals Tabs tablet Take 1 tablet by mouth daily.   nitroGLYCERIN 0.4 MG SL tablet Commonly known as:  NITROSTAT DISSOLVE ONE TABLET UNDER THE TONGUE EVERY 5 MINUTES AS NEEDED FOR CHEST PAIN.  DO NOT EXCEED A TOTAL OF 3 DOSES IN 15 MINUTES What changed: See the new instructions.   pantoprazole 40 MG tablet Commonly known as: PROTONIX Take 1 tablet (40 mg total) by mouth daily. What changed: when to take this   polyethylene glycol 17 g packet Commonly known as: MIRALAX / GLYCOLAX Take 17 g by mouth daily.   PRESCRIPTION MEDICATION See admin instructions. CPAP- At bedtime   PROBIOTIC GUMMIES PO Take 2 tablets by mouth every morning.   rosuvastatin 40 MG tablet Commonly known as: CRESTOR Take 1 tablet (40 mg total) by mouth daily. What changed: when to take this   triamcinolone 55 MCG/ACT nasal inhaler Commonly known as: NASACORT Place 1 spray into both nostrils daily as needed (for allergies).   Vitamin D3 50 MCG (2000 UT) Tabs Take 2,000 Units by mouth daily.         Follow-up Information     [facility], [facility] Follow up.   Specialty: Home Health Services Why: Physical Therapy/Aide- office to call for visit times. Contact information: [address] [place] [address] [phone]                 Major procedures and Radiology Reports - PLEASE review detailed and final reports thoroughly  -       DG Chest 2 View  Result Date: 05/09/2022 CLINICAL DATA:  284132, encounter for cardiac device in-situ EXAM: CHEST - 2 VIEW COMPARISON:  May 03, 2022 FINDINGS: There has been interval left subclavian dual lead pacemaker insertion with 1 of the leads in the right atrium and the second in the right ventricle. No pneumothorax. Chest leads in place. Mild cardiomegaly. Mild central pulmonary vascular  congestion. No consolidation or pleural effusion. Right shoulder prosthesis. IMPRESSION: There has been interval insertion of left subclavian dual lead pacemaker. No pneumothorax. Cardiomegaly. Mild central pulmonary vascular congestion. Electronically  Signed   By: Frazier Richards M.D.   On: 05/09/2022 08:07   EP PPM/ICD IMPLANT  Result Date: 05/08/2022 SURGEON:  Allegra Lai, MD   PREPROCEDURE DIAGNOSIS:  sick sinus syndrome   POSTPROCEDURE DIAGNOSIS:  sick sinus syndrome    PROCEDURES:  1. Pacemaker implantation.   INTRODUCTION:  JERELYN TRIMARCO is a 81 y.o. female with a history of bradycardia who presents today for pacemaker implantation.  The patient reports intermittent episodes of dizziness over the past few months.  No reversible causes have been identified.  The patient therefore presents today for pacemaker implantation.   DESCRIPTION OF PROCEDURE:  Informed written consent was obtained, and  the patient was brought to the electrophysiology lab in a fasting state.  The patient required no sedation for the procedure today.  The patients left chest was prepped and draped in the usual sterile fashion by the EP lab staff. The skin overlying the left deltopectoral region was infiltrated with lidocaine for local analgesia.  A 4-cm incision was made over the left deltopectoral region.  A left subcutaneous pacemaker pocket was fashioned using a combination of sharp and blunt dissection. Electrocautery was required to assure hemostasis.    RA/RV Lead Placement: The left axillary vein was therefore cannulated.  Through the left axillary vein, a Medtronic CapSureFix Novus [id number]  (serial number  [id number]) right atrial lead and an Medtronic SelectSure [id number] (serial number  [id number]) right ventricular lead were advanced with fluoroscopic visualization into the right atrial appendage and left bundle area positions respectively.  Initial atrial lead P- waves measured 2 mV with impedance of 1096 ohms and a threshold of 1.5 V at 0.5 msec.  Right ventricular lead R-waves measured 6 mV with an impedance of 1204 ohms and a threshold of 1.5 V at 0.5 msec.  Both leads were secured to the pectoralis fascia using #2-0 silk over the suture sleeves. Device Placement:   The leads were then connected to an Medtronic Azure XT DR [id number]   (serial number  [id number] ) pacemaker.  The pocket was irrigated with copious gentamicin solution.  The pacemaker was then placed into the pocket.  The pocket was then closed in 3 layers with 2.0 Vicryl suture for the 3.0 Vicryl suture subcutaneous and subcuticular layers.  Steri-  Strips and a sterile dressing were then applied. EBL<63m.  There were no early apparent complications.   CONCLUSIONS:  1. Successful implantation of a St Jude Medical Assurity MRI dual-chamber pacemaker for symptomatic bradycardia  2. No early apparent complications.       Will CCurt Bears MD 05/08/2022 4:59 PM  DG Abd Portable 1V  Result Date: 05/08/2022 CLINICAL DATA:  Nausea EXAM: PORTABLE ABDOMEN - 1 VIEW COMPARISON:  Radiograph 09/13/2021 FINDINGS: There is no evidence of bowel obstruction. Mild-to-moderate stool burden. No radiopaque calculi overlie the kidneys. Lower lumbar spine degenerative changes. No acute osseous abnormality. IMPRESSION: No evidence of bowel obstruction.  Mild-to-moderate stool burden. Electronically Signed   By: JMaurine SimmeringM.D.   On: 05/08/2022 12:19   CARDIAC CATHETERIZATION  Result Date: 05/06/2022 Conclusions: Mild-moderate, non-obstructive coronary artery disease. Patent proximal/mid LAD stent with up to 30% in-stent restenosis. Low left ventricular filling pressure (LVEDP ~5 mmHg). Recommendations: Continue secondary prevention of coronary artery disease. Proceed with pacemaker placement per Dr. KCaryl Comes CNelva Bush MD  [facility]  ECHOCARDIOGRAM COMPLETE  Result Date: 05/04/2022    ECHOCARDIOGRAM REPORT   Patient Name:   Barbara [name] Date of Exam: 05/04/2022 Medical Rec #:  [id number]              Height:       67.0 in Accession #:    [id number]             Weight:       181.2 lb Date of Birth:  [birth year]              BSA:          1.939 m Patient Age:    13 years               BP:           133/41 mmHg Patient  Gender: F                      HR:           45 bpm. Exam Location:  Inpatient Procedure: 2D Echo, Color Doppler, Cardiac Doppler and Intracardiac            Opacification Agent Indications:    R07.9* Chest pain, unspecified; 122-I22.9 Subsequent ST                 elevation (STEM) and non-ST elevation (NSTEMI) myocardial                 infarction  History:        Patient has prior history of Echocardiogram examinations, most                 recent 10/08/2020. CAD, Abnormal ECG, Arrythmias:Bradycardia and                 Atrial Fibrillation, Signs/Symptoms:Chest Pain and Dyspnea; Risk                 Factors:Dyslipidemia and Hypertension.  Sonographer:    Roseanna Rainbow RDCS Referring Phys: Margurite Auerbach  Sonographer Comments: Technically difficult study due to poor echo windows. Image acquisition challenging due to patient body habitus. IMPRESSIONS  1. Left ventricular ejection fraction, by estimation, is >75%. The left ventricle has hyperdynamic function. The left ventricle has no regional wall motion abnormalities. Left ventricular diastolic parameters are indeterminate. Elevated left atrial pressure.  2. Right ventricular systolic function is normal. The right ventricular size is normal. There is mildly elevated pulmonary artery systolic pressure.  3. Left atrial size was moderately dilated.  4. The mitral valve is normal in structure. Moderate mitral valve regurgitation. No evidence of mitral stenosis.  5. The aortic valve is tricuspid. Aortic valve regurgitation is not visualized. Aortic valve sclerosis is present, with no evidence of aortic valve stenosis.  6. The inferior vena cava is normal in size with greater than 50% respiratory variability, suggesting right atrial pressure of 3 mmHg. FINDINGS  Left Ventricle: Left ventricular ejection fraction, by estimation, is >75%. The left ventricle has hyperdynamic function. The left ventricle has no regional wall motion abnormalities. Definity contrast agent was  given IV to delineate the left ventricular endocardial borders. The left ventricular internal cavity size was normal in size. There is no left ventricular hypertrophy. Left ventricular diastolic parameters are indeterminate. Elevated left atrial pressure. Right Ventricle: The right ventricular size is normal. Right ventricular systolic function is normal. There is mildly elevated pulmonary artery systolic pressure. The tricuspid regurgitant velocity is  3.14 m/s, and with an assumed right atrial pressure of 3 mmHg, the estimated right ventricular systolic pressure is 40.9 mmHg. Left Atrium: Left atrial size was moderately dilated. Right Atrium: Right atrial size was normal in size. Pericardium: There is no evidence of pericardial effusion. Mitral Valve: The mitral valve is normal in structure. Moderate mitral valve regurgitation. No evidence of mitral valve stenosis. MV peak gradient, 8.6 mmHg. The mean mitral valve gradient is 2.0 mmHg. Tricuspid Valve: The tricuspid valve is normal in structure. Tricuspid valve regurgitation is mild . No evidence of tricuspid stenosis. Aortic Valve: The aortic valve is tricuspid. Aortic valve regurgitation is not visualized. Aortic valve sclerosis is present, with no evidence of aortic valve stenosis. Pulmonic Valve: The pulmonic valve was normal in structure. Pulmonic valve regurgitation is trivial. No evidence of pulmonic stenosis. Aorta: The aortic root is normal in size and structure. Venous: The inferior vena cava is normal in size with greater than 50% respiratory variability, suggesting right atrial pressure of 3 mmHg. IAS/Shunts: No atrial level shunt detected by color flow Doppler.  LEFT VENTRICLE PLAX 2D LVIDd:         4.80 cm      Diastology LVIDs:         2.70 cm      LV e' medial:    3.50 cm/s LV PW:         0.80 cm      LV E/e' medial:  40.3 LV IVS:        1.00 cm      LV e' lateral:   7.56 cm/s LVOT diam:     1.70 cm      LV E/e' lateral: 18.7 LV SV:         82 LV SV  Index:   42 LVOT Area:     2.27 cm  LV Volumes (MOD) LV vol d, MOD A2C: 111.0 ml LV vol d, MOD A4C: 79.0 ml LV vol s, MOD A2C: 35.2 ml LV vol s, MOD A4C: 35.0 ml LV SV MOD A2C:     75.8 ml LV SV MOD A4C:     79.0 ml LV SV MOD BP:      59.2 ml RIGHT VENTRICLE             IVC RV S prime:     10.50 cm/s  IVC diam: 1.70 cm TAPSE (M-mode): 1.4 cm LEFT ATRIUM             Index        RIGHT ATRIUM           Index LA diam:        5.20 cm 2.68 cm/m   RA Area:     19.70 cm LA Vol (A2C):   79.9 ml 41.20 ml/m  RA Volume:   57.70 ml  29.75 ml/m LA Vol (A4C):   66.9 ml 34.50 ml/m LA Biplane Vol: 74.7 ml 38.52 ml/m  AORTIC VALVE LVOT Vmax:   156.00 cm/s LVOT Vmean:  96.000 cm/s LVOT VTI:    0.362 m  AORTA Ao Root diam: 2.50 cm Ao Asc diam:  3.00 cm MITRAL VALVE                  TRICUSPID VALVE MV Area (PHT): 2.87 cm       TR Peak grad:   39.4 mmHg MV Area VTI:   2.13 cm       TR Vmax:  314.00 cm/s MV Peak grad:  8.6 mmHg MV Mean grad:  2.0 mmHg       SHUNTS MV Vmax:       1.47 m/s       Systemic VTI:  0.36 m MV Vmean:      57.4 cm/s      Systemic Diam: 1.70 cm MV Decel Time: 264 msec MR Peak grad:    110.7 mmHg MR Mean grad:    77.0 mmHg MR Vmax:         526.00 cm/s MR Vmean:        409.0 cm/s MR PISA:         1.01 cm MR PISA Eff ROA: 8 mm MR PISA Radius:  0.40 cm MV E velocity: 141.00 cm/s MV A velocity: 49.30 cm/s MV E/A ratio:  2.86 Kirk Ruths MD Electronically signed by Kirk Ruths MD Signature Date/Time: 05/04/2022/11:50:25 AM    Final    DG Chest 2 View  Result Date: 05/03/2022 CLINICAL DATA:  Chest pain/tightness and palpitations. History of atrial fibrillation. EXAM: CHEST - 2 VIEW COMPARISON:  Chest radiographs 10/07/2020 and earlier. Cardiac MRI 05/19/2020. FINDINGS: The cardiac silhouette is mildly enlarged. Rounded density posteriorly in the left lung base is chronic and is favored to reflect either a Bochdalek's hernia or focal eventration of the left hemidiaphragm and associated atelectasis.  The lungs are clear elsewhere. No pleural effusion or pneumothorax is identified. A right shoulder arthroplasty is noted. IMPRESSION: No active cardiopulmonary disease. Electronically Signed   By: Logan Bores M.D.   On: 05/03/2022 12:02    Today   Subjective    Barbara [name] today has no headache,no chest abdominal pain,no new weakness tingling or numbness, feels much better wants to go home today.     Objective   Blood pressure (!) 137/56, pulse 71, temperature 98.4 F (36.9 C), temperature source Oral, resp. rate 17, height '5\' 7"'$  (1.702 m), weight 79.8 kg, SpO2 96 %.   Intake/Output Summary (Last 24 hours) at 05/10/2022 1039 Last data filed at 05/10/2022 0836 Gross per 24 hour  Intake 812.49 ml  Output 1950 ml  Net -1137.51 ml    Exam  Awake Alert, No new F.N deficits,    [place].AT,PERRAL Supple Neck,   Symmetrical Chest wall movement, Good air movement bilaterally, CTAB RRR,No Gallops,   +ve B.Sounds, Abd Soft, Non tender,  No Cyanosis, Clubbing or edema    Data Review   Recent Labs  Lab 05/03/22 1259 05/04/22 0318 05/06/22 0629 05/07/22 0101 05/08/22 0135 05/09/22 0714 05/10/22 0343  WBC 8.5   < > 8.6 7.5 7.7 8.2 8.9  HGB 12.3   < > 13.1 12.0 12.6 12.6 11.3*  HCT 38.2   < > 40.6 36.0 38.6 39.4 34.4*  PLT 177   < > 200 183 192 234 188  MCV 90.7   < > 87.9 88.7 89.4 88.1 88.0  MCH 29.2   < > 28.4 29.6 29.2 28.2 28.9  MCHC 32.2   < > 32.3 33.3 32.6 32.0 32.8  RDW 12.5   < > 12.6 12.8 12.8 13.0 12.9  LYMPHSABS 1.0  --   --   --   --   --  1.4  MONOABS 0.5  --   --   --   --   --  1.1*  EOSABS 0.0  --   --   --   --   --  0.1  BASOSABS 0.0  --   --   --   --   --  0.0   < > = values in this interval not displayed.    Recent Labs  Lab 05/04/22 0318 05/06/22 0629 05/07/22 0101 05/08/22 0135 05/08/22 1909 05/09/22 0714 05/10/22 0343  NA 141 138 140 138  --  137 135  K 4.2 3.8 4.2 4.3  --  4.6 5.0  CL 106 109 109 103  --  104 104  CO2 23 20* 23 23  --   21* 22  GLUCOSE 116* 103* 114* 109*  --  145* 110*  BUN 24* '21 23 23  '$ --  29* 41*  CREATININE 1.58* 1.54* 1.58* 1.80*  --  1.57* 1.64*  CALCIUM 10.1 10.0 9.7 10.3  --  10.1 9.4  AST 26  --   --   --   --   --   --   ALT 16  --   --   --   --   --   --   ALKPHOS 66  --   --   --   --   --   --   BILITOT 0.8  --   --   --   --   --   --   ALBUMIN 3.7  --   --   --   --   --   --   MG 2.0 1.8 1.7 1.7 2.3 2.2 2.3  BNP  --  639.3* 636.3* 520.4*  --   --   --     Total Time in preparing paper work, data evaluation and todays exam - 35 minutes  Lala Lund M.D on 05/10/2022 at 10:39 AM  [facility]

## 2022-05-10 NOTE — Progress Notes (Addendum)
Progress Note  Patient Name: Barbara [name] Date of Encounter: 05/10/2022  [facility] Cardiologist: Shelva Majestic, MD   Subjective   Feels well currently, denies pain, does not want pain maeds.  Inpatient Medications    Scheduled Meds:  amiodarone  200 mg Oral BID   apixaban  2.5 mg Oral BID   aspirin  81 mg Oral Daily   atropine  1 mg Intravenous Once   bisacodyl  10 mg Oral Once   Chlorhexidine Gluconate Cloth  6 each Topical Q0600   hydrALAZINE  50 mg Oral Q8H   isosorbide mononitrate  30 mg Oral Daily   lactulose  20 g Oral Q6H   metoprolol succinate  25 mg Oral Daily   pantoprazole  40 mg Oral Daily   polyethylene glycol  17 g Oral BID   rosuvastatin  40 mg Oral QHS   sodium chloride flush  3 mL Intravenous Q12H   Continuous Infusions:  sodium chloride Stopped (05/09/22 1559)   PRN Meds: sodium chloride, acetaminophen, docusate sodium, hydrALAZINE, lip balm, nitroGLYCERIN   Vital Signs    Vitals:   05/10/22 0500 05/10/22 0514 05/10/22 0811 05/10/22 0816  BP:  (!) 121/59 (!) 137/50 (!) 137/56  Pulse:  (!) 59 60 71  Resp:  19  17  Temp:  98.9 F (37.2 C)  98.4 F (36.9 C)  TempSrc:  Oral  Oral  SpO2:  97%  96%  Weight: 79.8 kg     Height:        Intake/Output Summary (Last 24 hours) at 05/10/2022 0833 Last data filed at 05/10/2022 0500 Gross per 24 hour  Intake 862.49 ml  Output 1450 ml  Net -587.51 ml      05/10/2022    5:00 AM 05/09/2022   10:55 PM 05/09/2022    5:00 AM  Last 3 Weights  Weight (lbs) 175 lb 14.4 oz 175 lb 14.4 oz 181 lb 10.5 oz  Weight (kg) 79.788 kg 79.788 kg 82.4 kg      Telemetry    SR 70's-90s, PACs,- Personally Reviewed   ECG    SR 86bpm, QTc 485m - Personally Reviewed  Physical Exam   GEN: No acute distress.   Neck: No JVD Cardiac: RRR, no murmurs, rubs, or gallops.  Respiratory: CTA b/l. GI: Soft, nontender, non-distended  MS: No edema; No deformity. Neuro:  Nonfocal  Psych: Normal affect   Pacer  site is stable, no b leeding, no hematoma  Labs    High Sensitivity Troponin:   Recent Labs  Lab 05/03/22 1206 05/03/22 1346 05/03/22 1545 05/03/22 1745 05/04/22 0009  TROPONINIHS 569* 801* 1,052* 1,115* 521*     Chemistry Recent Labs  Lab 05/04/22 0318 05/06/22 0629 05/08/22 0135 05/08/22 1909 05/09/22 0714 05/10/22 0343  NA 141   < > 138  --  137 135  K 4.2   < > 4.3  --  4.6 5.0  CL 106   < > 103  --  104 104  CO2 23   < > 23  --  21* 22  GLUCOSE 116*   < > 109*  --  145* 110*  BUN 24*   < > 23  --  29* 41*  CREATININE 1.58*   < > 1.80*  --  1.57* 1.64*  CALCIUM 10.1   < > 10.3  --  10.1 9.4  MG 2.0   < > 1.7 2.3 2.2 2.3  PROT 6.5  --   --   --   --   --  ALBUMIN 3.7  --   --   --   --   --   AST 26  --   --   --   --   --   ALT 16  --   --   --   --   --   ALKPHOS 66  --   --   --   --   --   BILITOT 0.8  --   --   --   --   --   GFRNONAA 32*   < > 28*  --  33* 31*  ANIONGAP 12   < > 12  --  12 9   < > = values in this interval not displayed.    Lipids No results for input(s): "CHOL", "TRIG", "HDL", "LABVLDL", "LDLCALC", "CHOLHDL" in the last 168 hours.  Hematology Recent Labs  Lab 05/08/22 0135 05/09/22 0714 05/10/22 0343  WBC 7.7 8.2 8.9  RBC 4.32 4.47 3.91  HGB 12.6 12.6 11.3*  HCT 38.6 39.4 34.4*  MCV 89.4 88.1 88.0  MCH 29.2 28.2 28.9  MCHC 32.6 32.0 32.8  RDW 12.8 13.0 12.9  PLT 192 234 188   Thyroid No results for input(s): "TSH", "FREET4" in the last 168 hours.  BNP Recent Labs  Lab 05/06/22 0629 05/07/22 0101 05/08/22 0135  BNP 639.3* 636.3* 520.4*    DDimer No results for input(s): "DDIMER" in the last 168 hours.   Radiology      Cardiac Studies   05/04/22: TTE  1. Left ventricular ejection fraction, by estimation, is >75%. The left  ventricle has hyperdynamic function. The left ventricle has no regional  wall motion abnormalities. Left ventricular diastolic parameters are  indeterminate. Elevated left atrial  pressure.    2. Right ventricular systolic function is normal. The right ventricular  size is normal. There is mildly elevated pulmonary artery systolic  pressure.   3. Left atrial size was moderately dilated.   4. The mitral valve is normal in structure. Moderate mitral valve  regurgitation. No evidence of mitral stenosis.   5. The aortic valve is tricuspid. Aortic valve regurgitation is not  visualized. Aortic valve sclerosis is present, with no evidence of aortic  valve stenosis.   6. The inferior vena cava is normal in size with greater than 50%  respiratory variability, suggesting right atrial pressure of 3 mmHg.   10/08/2020; TTE  1. Left ventricular ejection fraction, by estimation, is 70 to 75%. The  left ventricle has hyperdynamic function. The left ventricle has no  regional wall motion abnormalities. There is moderate eccentric left  ventricular hypertrophy of the apical  segment. Left ventricular diastolic parameters are indeterminate. Elevated  left ventricular end-diastolic pressure.   2. Right ventricular systolic function is normal. The right ventricular  size is normal. Tricuspid regurgitation signal is inadequate for assessing  PA pressure.   3. The mitral valve is abnormal. Mild mitral valve regurgitation.  Moderate mitral annular calcification.   4. The aortic valve is grossly normal. There is mild calcification of the  aortic valve. There is mild thickening of the aortic valve. Aortic valve  regurgitation is not visualized. Mild aortic valve sclerosis is present,  with no evidence of aortic valve  stenosis.   Comparison(s): No significant change from prior study.     02/26/2019: LHC Prox RCA to Mid RCA lesion is 15% stenosed. Ost LAD to Prox LAD lesion is 20% stenosed. Prox Cx lesion is 20% stenosed.   Mild non-obstructive  coronary artery disease with a patent proximal LAD stent with mild 20% intimal hyperplasia (inserted  09/1999); 20% proximal left circumflex narrowing,  and mild irregularity of 10 to 15% in the mid RCA.   Hyperdynamic LV function with an "Ace of Spade "configuration and near cavity obliteration in the mid to apical segment during systole.  There is evidence for left ventricular hypertrophy.  LVEDP is 16 mm.     Patient Profile      81 y.o. female w/PMHx of AFib, apical hypertrophic cardiomyopathy, coronary artery disease with history of PCI of the LAD, hypertension, hyperlipidemia, OSA, CKD (III), initially admitted to [facility] with CP, w/concerns of NSTEMI got a dose of BB resulting in development of marked bradycardia > glucagon, atropine and isuprel with improvement  Planned for cath, though suspect 2/2 demand ischemia 2/2 RVR on presentation EP consult for tachy-brady  Assessment & Plan    Paroxysmal Afib  CHA2DS2Vasc is at least 5, on eliquis at home, held for procedures Home Tikosyn stopped on admission  Failed Tikosyn re-load with QT prolongation HR better on metoprolol Add amiodarone '200mg'$  BID for 1 month, then daily Sophy Mesler plan for her to get labs at her wound check visit   Tachy-brady Historically has had her nodal blocking agents stopped 2/2 bradycardia as well. 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 CP 4. CAD Not an ongoing complaint HS Trop peaked at 1115 > last 521 Cath this admission with no obstructive disease  5. Apical HCM Not felt to be volume OL recommended for out patient referral for genetics consult  Dr. Curt Bears has seen the patient OK to discharge from our perspective Follow up is in place    For questions or updates, please contact [facility] Please consult [email or web address] for contact info under        Signed, Baldwin Jamaica, PA-C   05/10/2022, 8:33 AM    I have seen and examined this patient with Tommye Standard.  Agree with above, note added to reflect my findings.  Feeling well today.  Remains in sinus rhythm.  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  Device functioning appropriately.  Follow-up has been arranged in device clinic. Paroxysmal atrial fibrillation: QTc significantly prolonged on Tikosyn.  We Delsa Walder stop Tikosyn and plan to load on amiodarone as above.  We Shwanda Soltis arrange for follow-up in clinic.  EP to sign off for now.  Let us know if there is any other issues needed.  Masa Lubin M. Asencion Loveday MD 05/10/2022 10:23 AM

## 2022-05-10 NOTE — Progress Notes (Signed)
Physical Therapy Treatment Patient Details Name: Barbara [name] MRN: [id number] DOB: June 12, 1941 Today's Date: 05/10/2022   History of Present Illness Pt is an 81 y.o. female admitted 05/03/22 with chest pain, bradycardia; workup for NSTEMI. S/p pacemaker insertion 7/5. PMH includes PAF, hypertrophic cardiomyopathy, CAD, HTN, HLD, OSA.    PT Comments    Pt received OOB in recliner on arrival and agreeable to session with continued progress. Pt with good recall and adherence to LUE pacemaker precautions throughout. Pt demonstrating increased ambulation with SPC and min guard for safety/balance, as pt with some minor LOB when negotiating crowded hall or turning head R/L during ambulation, however pt able to self correct in all instances. Pt continues to benefit from skilled PT services to progress toward functional mobility goals.   Pre-ambulation HR: 71 During ambulation HR: 79-86 Post ambulation BP: 180/74 on R forearm HR: 79   Recommendations for follow up therapy are one component of a multi-disciplinary discharge planning process, led by the attending physician.  Recommendations may be updated based on patient status, additional functional criteria and insurance authorization.  Follow Up Recommendations  Home health PT     Assistance Recommended at Discharge Intermittent Supervision/Assistance  Patient can return home with the following A little help with bathing/dressing/bathroom;Assist for transportation;Help with stairs or ramp for entrance;Assistance with cooking/housework   Equipment Recommendations  None recommended by PT    Recommendations for Other Services       Precautions / Restrictions Precautions Precautions: Fall;ICD/Pacemaker     Mobility  Bed Mobility Overal bed mobility: Needs Assistance             General bed mobility comments: pt OOB in recliner on arrival    Transfers Overall transfer level: Needs assistance Equipment used: Straight  cane Transfers: Sit to/from Stand Sit to Stand: Min assist           General transfer comment: light min assist with hand on back to facilitate anterior weight shift to come to stand from low recliner    Ambulation/Gait Ambulation/Gait assistance: Min guard Gait Distance (Feet): 240 Feet Assistive device: Straight cane Gait Pattern/deviations: Step-through pattern, Decreased stride length, Trunk flexed Gait velocity: Decreased     General Gait Details: slow gait with SPC in hall, mild LOB with negotiating crowded hall and turning head R/L with pt able to self correct in all instances. distance limited to pt stated tolerance   Stairs             Wheelchair Mobility    Modified Rankin (Stroke Patients Only)       Balance Overall balance assessment: Needs assistance Sitting-balance support: Feet supported, No upper extremity supported Sitting balance-Leahy Scale: Good     Standing balance support: No upper extremity supported, During functional activity, Single extremity supported Standing balance-Leahy Scale: Fair Standing balance comment: can static stand and walk without UE support                            Cognition Arousal/Alertness: Awake/alert Behavior During Therapy: WFL for tasks assessed/performed Overall Cognitive Status: Within Functional Limits for tasks assessed                                          Exercises      General Comments General comments (skin integrity, edema, etc.): continued review of pacemaker precautions,  pt continues to demonstrate good awareness of this. HR at rest 71bpm, BP 137/56 pre ambulation, HR 80s during ambulation; post-ambulation BP 180/74 via R forearm.      Pertinent Vitals/Pain Pain Assessment Pain Assessment: Faces Faces Pain Scale: Hurts a little bit Pain Location: L knee Pain Descriptors / Indicators: Sore Pain Intervention(s): Monitored during session    Home Living                           Prior Function            PT Goals (current goals can now be found in the care plan section) Acute Rehab PT Goals Patient Stated Goal: to get home PT Goal Formulation: With patient Time For Goal Achievement: 05/21/22    Frequency    Min 3X/week      PT Plan Current plan remains appropriate    Co-evaluation              AM-PAC PT "6 Clicks" Mobility   Outcome Measure  Help needed turning from your back to your side while in a flat bed without using bedrails?: A Little Help needed moving from lying on your back to sitting on the side of a flat bed without using bedrails?: A Little Help needed moving to and from a bed to a chair (including a wheelchair)?: A Little Help needed standing up from a chair using your arms (e.g., wheelchair or bedside chair)?: A Little Help needed to walk in hospital room?: A Little Help needed climbing 3-5 steps with a railing? : A Little 6 Click Score: 18    End of Session Equipment Utilized During Treatment: Gait belt Activity Tolerance: Patient tolerated treatment well Patient left: in bed;with call bell/phone within reach;with bed alarm set (seated EOB) Nurse Communication: Mobility status PT Visit Diagnosis: Unsteadiness on feet (R26.81);Muscle weakness (generalized) (M62.81);Difficulty in walking, not elsewhere classified (R26.2)     Time: 6440-3474 PT Time Calculation (min) (ACUTE ONLY): 17 min  Charges:  $Gait Training: 8-22 mins                     Barbara [name] R. PTA Acute Rehabilitation Services Office: [place] 05/10/2022, 10:00 AM

## 2022-05-10 NOTE — Progress Notes (Signed)
Occupational Therapy Treatment Patient Details Name: Barbara [name] MRN: [id number] DOB: 1940/12/19 Today's Date: 05/10/2022   History of present illness Pt is an 81 y.o. female admitted 05/03/22 with chest pain, bradycardia; workup for NSTEMI. S/p pacemaker insertion 7/5. PMH includes PAF, hypertrophic cardiomyopathy, CAD, HTN, HLD, OSA.   OT comments  Follow up session for ADL compliance given pacemaker precautions.  Patient with good understanding and ability to demonstrate Mod I in ADL completion from sit/stand level.  No further OT needs in the acute setting.  Follow MD recommendations for discharge.     Recommendations for follow up therapy are one component of a multi-disciplinary discharge planning process, led by the attending physician.  Recommendations may be updated based on patient status, additional functional criteria and insurance authorization.    Follow Up Recommendations  Follow physician's recommendations for discharge plan and follow up therapies    Assistance Recommended at Discharge PRN  Patient can return home with the following  Assist for transportation   Equipment Recommendations       Recommendations for Other Services      Precautions / Restrictions Precautions Precautions: Fall;ICD/Pacemaker Restrictions Weight Bearing Restrictions: Yes LUE Weight Bearing: Weight bearing as tolerated Other Position/Activity Restrictions: limit forceful pusing and pulling       Mobility Bed Mobility                    Transfers Overall transfer level: Modified independent Equipment used: Straight cane                     Balance           Standing balance support: Single extremity supported Standing balance-Leahy Scale: Good                             ADL either performed or assessed with clinical judgement   ADL Overall ADL's : Modified independent                                             Extremity/Trunk Assessment Upper Extremity Assessment Upper Extremity Assessment: Overall WFL for tasks assessed   Lower Extremity Assessment Lower Extremity Assessment: Defer to PT evaluation   Cervical / Trunk Assessment Cervical / Trunk Assessment: Normal    Vision       Perception     Praxis      Cognition Arousal/Alertness: Awake/alert Behavior During Therapy: WFL for tasks assessed/performed Overall Cognitive Status: Within Functional Limits for tasks assessed                                                       General Comments continued review of pacemaker precautions, VSS on RA    Pertinent Vitals/ Pain       Pain Assessment Pain Assessment: No/denies pain Pain Intervention(s): Monitored during session  Frequency           Progress Toward Goals  OT Goals(current goals can now be found in the care plan section)  Progress towards OT goals: Goals met/education completed, patient discharged from OT  Acute Rehab OT Goals OT Goal Formulation: With patient Time For Goal Achievement: 05/23/22 Potential to Achieve Goals: Good  Plan All goals met and education completed, patient discharged from OT services    Co-evaluation                 AM-PAC OT "6 Clicks" Daily Activity     Outcome Measure   Help from another person eating meals?: None Help from another person taking care of personal grooming?: None Help from another person toileting, which includes using toliet, bedpan, or urinal?: None Help from another person bathing (including washing, rinsing, drying)?: None Help from another person to put on and taking off regular upper body clothing?: A Little Help from another person to put on and taking off regular lower body clothing?: None 6 Click Score: 23    End of Session    OT Visit Diagnosis: Unsteadiness on feet (R26.81);Muscle  weakness (generalized) (M62.81)   Activity Tolerance Patient tolerated treatment well   Patient Left in bed;with call bell/phone within reach   Nurse Communication Mobility status        Time: 3887-1959 OT Time Calculation (min): 16 min  Charges: OT General Charges $OT Visit: 1 Visit OT Treatments $Self Care/Home Management : 8-22 mins  05/10/2022  RP, OTR/L  Acute Rehabilitation Services  Office:  [phone]   Metta Clines 05/10/2022, 10:35 AM

## 2022-05-13 ENCOUNTER — Telehealth: Payer: Self-pay

## 2022-05-13 ENCOUNTER — Telehealth: Payer: Self-pay | Admitting: Family Medicine

## 2022-05-13 NOTE — Telephone Encounter (Addendum)
Transition Care Management Follow-up Telephone Call Date of discharge and from where: 05/10/22 from [facility] How have you been since you were released from the hospital? Pt states this am & yesterday am she noticed that she was feeling dizzy & vision didn't appear clear. Dizziness is worse when getting up in the am. Pt states dizziness resolves when not moving & not as bad when she's moving around but still notices it. Vision not a clear as last as last week, pt is feeling "tired" all the time. Explained that her body when through a lot & she needs to be resting when she feels tired. Any questions or concerns? Yes  Items Reviewed: Did the pt receive and understand the discharge instructions provided? Yes  Medications obtained and verified? Yes  Other? No  Any new allergies since your discharge? No  Dietary orders reviewed? No Do you have support at home? No   Home Care and Equipment/Supplies: Were home health services ordered? yes If so, what is the name of the agency? [facility]  Has the agency set up a time to come to the patient's home? No, pt states she called them this am & is waiting for them to call back. Were any new equipment or medical supplies ordered?  Yes What is the name of the medical supply agency? [employer]? (Pt was unsure) Were you able to get the supplies/equipment? yes Do you have any questions related to the use of the equipment or supplies? Yes: Pt wanted to know about the lights on the machine. She is encouraged to read the booklet that came with the machine to talks about what to look out for & what to do, who to contact if something is wrong with the machine.  Functional Questionnaire: (I = Independent and D = Dependent) ADLs: I  Bathing/Dressing- I  Meal Prep- I  Eating- I  Maintaining continence- I  Transferring/Ambulation- I  Managing Meds- I  Follow up appointments reviewed:  PCP Hospital f/u appt confirmed? Yes  Scheduled to see Sarajane Jews on 05/14/22 @  1:30PM. [place] [facility] f/u appt confirmed? No  Pt will try calling again today Are transportation arrangements needed? No If their condition worsens, is the pt aware to call PCP or go to the Emergency Dept.? Yes Was the patient provided with contact information for the PCP's office or ED? Yes Was to pt encouraged to call back with questions or concerns? Yes

## 2022-05-13 NOTE — Telephone Encounter (Signed)
---  She was in hospital last week to get pacemaker after heart attack, returned home Friday. She was feeling, started dizzy all day. She is not having any chest pain. She feels like she is going to throw up. She feels anxious .  05/13/2022 8:55:35 AM Go to ED Now (or PCP triage) Hardin Negus, RN, Mardene Celeste  Referrals [place] REFUSED  See [facility] phone encounter.

## 2022-05-13 NOTE — Telephone Encounter (Signed)
Patient has OV appt on 05/14/22

## 2022-05-13 NOTE — Telephone Encounter (Signed)
Pt has a Hospital FU tomorrow, but is concerned because she is having some dizziness. Pt stated she had a pacemaker inserted on Wednesday and they changed her meds, as well. Pt spoke to Triage, but states she did not understand what she was saying.   Pt would like a call back to discuss the dizziness.   [phone]         - or- [phone]

## 2022-05-13 NOTE — Telephone Encounter (Signed)
Transition Care Management Unsuccessful Follow-up Telephone Call  Date of discharge and from where:  [facility]/10/2022 [facility]  Attempts:  1st Attempt  Reason for unsuccessful TCM follow-up call:  Left voice message

## 2022-05-14 ENCOUNTER — Encounter: Payer: Self-pay | Admitting: Family Medicine

## 2022-05-14 ENCOUNTER — Ambulatory Visit (INDEPENDENT_AMBULATORY_CARE_PROVIDER_SITE_OTHER): Payer: Medicare Other | Admitting: Family Medicine

## 2022-05-14 VITALS — BP 154/60 | HR 60 | Temp 98.5°F | Wt 173.0 lb

## 2022-05-14 DIAGNOSIS — N1832 Chronic kidney disease, stage 3b: Secondary | ICD-10-CM | POA: Diagnosis not present

## 2022-05-14 DIAGNOSIS — I10 Essential (primary) hypertension: Secondary | ICD-10-CM | POA: Diagnosis not present

## 2022-05-14 DIAGNOSIS — I5042 Chronic combined systolic (congestive) and diastolic (congestive) heart failure: Secondary | ICD-10-CM

## 2022-05-14 DIAGNOSIS — I480 Paroxysmal atrial fibrillation: Secondary | ICD-10-CM

## 2022-05-14 DIAGNOSIS — I422 Other hypertrophic cardiomyopathy: Secondary | ICD-10-CM | POA: Diagnosis not present

## 2022-05-14 DIAGNOSIS — R001 Bradycardia, unspecified: Secondary | ICD-10-CM | POA: Diagnosis not present

## 2022-05-14 DIAGNOSIS — I2510 Atherosclerotic heart disease of native coronary artery without angina pectoris: Secondary | ICD-10-CM | POA: Diagnosis not present

## 2022-05-14 NOTE — Progress Notes (Signed)
   Subjective:    Patient ID: Barbara [name], female    DOB: 1941/09/24, 81 y.o.   MRN: [id number]  HPI Here to follow up a hospital stay from 05-03-22 to 05-10-22. She presented with SOB and chest pain, and she was found to have a NSTEMI. She was also very bradycardic, and she was diagnosed with tachycardia/bradycardia syndrome. She underwent a cardiac cath and this showed non-obstructing lesions in the coronary arteries. She was placed on a beta blocker, Hydralazine, Imdur, and Amiodarone. She also had a pacemaker placed. Since her DC she has felt fine other than having some mild lightheadedness if she gets up from a lying or sitting position to quickly. No SOB or chest pain. Ayt DC her creatinine was stable at 1.64, Hgb was 11.3, potassium was 5.0, and magnesium was 2.3.    Review of Systems  Constitutional: Negative.   Respiratory: Negative.    Cardiovascular: Negative.   Gastrointestinal: Negative.   Genitourinary: Negative.   Neurological:  Positive for light-headedness.       Objective:   Physical Exam Constitutional:      Appearance: Normal appearance.     Comments: Walks with a cane   Cardiovascular:     Rate and Rhythm: Normal rate and regular rhythm.     Pulses: Normal pulses.     Heart sounds: Normal heart sounds.     Comments: Occasional ectopy  Pulmonary:     Effort: Pulmonary effort is normal.     Breath sounds: Normal breath sounds.  Neurological:     General: No focal deficit present.     Mental Status: She is alert and oriented to person, place, and time. Mental status is at baseline.           Assessment & Plan:  She is doing well after being treated for tachycardia/bradycardia and a NSTEMI. Her pacemaker is functioning properly. No signs of fibrillation today. Her HTN is stable. She will follow up with Cardiology on 05-16-22. We spent a total of ( 33  ) minutes reviewing records and discussing these issues.  Alysia Penna, MD

## 2022-05-15 DIAGNOSIS — I509 Heart failure, unspecified: Secondary | ICD-10-CM | POA: Diagnosis not present

## 2022-05-15 DIAGNOSIS — I2510 Atherosclerotic heart disease of native coronary artery without angina pectoris: Secondary | ICD-10-CM | POA: Diagnosis not present

## 2022-05-15 DIAGNOSIS — I214 Non-ST elevation (NSTEMI) myocardial infarction: Secondary | ICD-10-CM | POA: Diagnosis not present

## 2022-05-15 DIAGNOSIS — I130 Hypertensive heart and chronic kidney disease with heart failure and stage 1 through stage 4 chronic kidney disease, or unspecified chronic kidney disease: Secondary | ICD-10-CM | POA: Diagnosis not present

## 2022-05-15 DIAGNOSIS — N1831 Chronic kidney disease, stage 3a: Secondary | ICD-10-CM | POA: Diagnosis not present

## 2022-05-16 ENCOUNTER — Ambulatory Visit: Payer: Medicare Other | Admitting: Physician Assistant

## 2022-05-16 ENCOUNTER — Ambulatory Visit: Payer: Medicare Other

## 2022-05-16 ENCOUNTER — Encounter: Payer: Self-pay | Admitting: Physician Assistant

## 2022-05-16 VITALS — BP 140/74 | HR 60 | Ht 67.0 in | Wt 178.4 lb

## 2022-05-16 DIAGNOSIS — I480 Paroxysmal atrial fibrillation: Secondary | ICD-10-CM | POA: Diagnosis not present

## 2022-05-16 DIAGNOSIS — I422 Other hypertrophic cardiomyopathy: Secondary | ICD-10-CM

## 2022-05-16 DIAGNOSIS — I10 Essential (primary) hypertension: Secondary | ICD-10-CM

## 2022-05-16 DIAGNOSIS — I2510 Atherosclerotic heart disease of native coronary artery without angina pectoris: Secondary | ICD-10-CM | POA: Diagnosis not present

## 2022-05-16 DIAGNOSIS — R001 Bradycardia, unspecified: Secondary | ICD-10-CM

## 2022-05-16 DIAGNOSIS — E785 Hyperlipidemia, unspecified: Secondary | ICD-10-CM | POA: Diagnosis not present

## 2022-05-16 MED ORDER — AMIODARONE HCL 200 MG PO TABS: 200.0000 mg | ORAL_TABLET | Freq: Every day | ORAL | 3 refills | Status: DC

## 2022-05-16 NOTE — Patient Instructions (Signed)
Medication Instructions:  Your physician has recommended you make the following change in your medication: As of AUGUST 7th, 2023 please DECREASE the AMIODARONE '200mg'$  ONCE DAILY.  *If you need a refill on your cardiac medications before your next appointment, please call your pharmacy*   Lab Work: NONE If you have labs (blood work) drawn today and your tests are completely normal, you will receive your results only by: [place] (if you have MyChart) OR A paper copy in the mail If you have any lab test that is abnormal or we need to change your treatment, we will call you to review the results.   Testing/Procedures: NONE   Follow-Up: At [facility], you and your health needs are our priority.  As part of our continuing mission to provide you with exceptional heart care, we have created designated Provider Care Teams.  These Care Teams include your primary Cardiologist (physician) and Advanced Practice Providers (APPs -  Physician Assistants and Nurse Practitioners) who all work together to provide you with the care you need, when you need it.   Other Instructions Please keep follow up's as scheduled.

## 2022-05-16 NOTE — Progress Notes (Signed)
Cardiology Office Note:    Date:  05/18/2022   ID:  Barbara [name], DOB 1941-02-24, MRN [id number]  PCP:  Laurey Morale, MD   [facility] Cardiologist:  Shelva Majestic, MD     Referring MD: Laurey Morale, MD   Chief Complaint  Patient presents with   Follow-up    Seen for Dr. Claiborne Billings    History of Present Illness:    Barbara [name] is a 81 y.o. female with a hx of CAD, A-fib, tachy-brady syndrome s/p recent pacemaker, hypertension, and hyperlipidemia.  Cardiac catheterization in November 2000 revealed a high-grade LAD lesion treated with bare-metal stent.  She has a history of bradycardia concerning for chronotropic incompetence.  Myoview in December 2015 showed normal EF, normal perfusion.  Echocardiogram in December 2015 showed EF 60 to 65%, moderate LVH, grade 1 DD.  Patient was diagnosed with atrial flutter during office visit in January 2019.  She was started on Eliquis.  Subsequent echocardiogram showed normal EF, grade 2 DD, mild to moderate MR.  She was hospitalized and underwent Tikosyn loading by Dr. Rayann Heman which converted her back to sinus rhythm.  She was seen by ENT in 2019 due to hoarseness and was told that her left vocal cord was not moving.  She was diagnosed with obstructive sleep apnea based on sleep study in 2019 as well.  Patient developed chest pain and increasing dyspnea in April 2020, troponin was mildly elevated in the hospital and she had inferolateral T wave inversion.  She underwent cardiac catheterization which revealed a nonobstructive CAD with widely patent LAD stent.  She had LVH with Spaete like ventricle.  Echocardiogram in 2021 demonstrated EF 70 to 75%, mildly elevated RV systolic pressure of 07.8, apical hypertrophy with obliteration of the LV cavity in systole suggestive of apical hypertrophic cardiomyopathy.  She was hospitalized in December 2021 with atrial fibrillation with RVR and was restarted on Eliquis.  She was found  to have demand ischemia.  Tikosyn was also restarted.  Subsequent Zio patch revealed frequent PACs, nonsustained atrial tachycardia without recurrent A-fib.  Patient was last seen by Dr. Claiborne Billings on 03/13/2022 at which time she had a mildly elevated blood pressure and mild ankle edema.  More recently, patient was admitted to the hospital on 05/03/2022 with NSTEMI and recurrent A-fib with RVR.  She was given a dose of Toprol-XL, her heart rate fell to the 20s and 30s while in A-fib.  She was subsequently given glucagon and atropine with improvement in the heart rate.  Cardiac catheterization performed on 05/06/2022 showed a nonobstructive CAD with 30% in-stent restenosis in proximal to mid LAD, otherwise no critical lesion to explain the elevated troponin.  Echocardiogram obtained on 05/04/2022 showed EF greater than 75%, no regional wall motion abnormality, mildly elevated pulmonary artery systolic pressure, moderate LAE, moderate MR.  Patient eventually underwent Medtronic MRI dual-chamber pacemaker implantation by Dr. Curt Bears on 05/08/2022 for symptomatic bradycardia.  Postprocedure, her Tikosyn had to be discontinued due to prolonged QT.  She was eventually discharged on amiodarone 200 mg twice a day for 1 month then plan to transition to daily dosing afterward.  It was also recommended for outpatient genetic consult for apical hypertrophic cardiomyopathy.  Patient presents today for follow-up.  She denies any chest pain or shortness of breath.  Initially after her discharge, she did have some headache.  Her blood pressure is borderline high today at 140/74, we talked about potentially increase the hydralazine further, however she  says when her blood pressure get down to the 120s range, she typically have more dizzy spell and the fatigue.  I recommended reducing the amiodarone down to 200 mg daily starting on 8/7.  Overall, she is doing well and can follow-up with EP service.  Note, although initial pacemaker report  says a dual-chamber [place] pacemaker was placed, in fact the patient had a dual-chamber Medtronic pacemaker placed instead.  Past Medical History:  Diagnosis Date   A-fib [facility])    Allergy    CAD (coronary artery disease)    sees Dr. Shelva Majestic  cardiac stents - 2000   CHF (congestive heart failure) ([place])    Colon polyps    Complication of anesthesia    rash/hives with "caines"   Dyspnea    02/12/18 " when my heart gets out of rhythm, it has not been out of rhythym- since I have been on Tikosyn (11/2017)   Dysrhythmia    afib fib   GERD (gastroesophageal reflux disease)    takes OTC- Omeprazole- prn   Heart murmur    History of stress test    show normal perfusion without scar or ischemia, post EF 68%   Hx of echocardiogram    show an EF 55%-60% range with grade 1 diastolic dysfunction, she had mitral anular calcification with mild MR, moderate LA dilation and mild pulmonary hypertension with a PA estimated pressure of 64m   Hyperlipidemia    Hypertension    NSTEMI (non-ST elevated myocardial infarction) ([place]    Osteoarthritis    Pneumonia    hx of 2015    PONV (postoperative nausea and vomiting)     Past Surgical History:  Procedure Laterality Date   CARDIAC CATHETERIZATION     11/2017   cardiac stents  2000   COLONOSCOPY  01-05-14   per Dr. JTeena Irani clear, no repeats needed    COLONOSCOPY     CORONARY STENT PLACEMENT  2000   in LAD   DIRECT LARYNGOSCOPY WITH RADIAESSE INJECTION N/A 02/13/2018   Procedure: DIRECT LARYNGOSCOPY WITH RADIAESSE INJECTION;  Surgeon: BMelida Quitter MD;  [facility]  Service: ENT;  Laterality: N/A;   EYE SURGERY Left    CATARACT REMOVAL   KNEE ARTHROSCOPY Left 01/06/2015   Procedure: LEFT KNEE ARTHROSCOPY, abrasion chondroplasty of the medial femerol condryl,medial and lateral menisectomy, microfracture , synovectomy of the suprpatellar pouch;  Surgeon: RLatanya Maudlin MD;  Location: [facility];  Service: Orthopedics;  Laterality: Left;    LEFT HEART CATH AND CORONARY ANGIOGRAPHY N/A 02/26/2019   Procedure: LEFT HEART CATH AND CORONARY ANGIOGRAPHY;  Surgeon: KTroy Sine MD;  [facility];  Service: Cardiovascular;  Laterality: N/A;   LEFT HEART CATH AND CORONARY ANGIOGRAPHY N/A 05/06/2022   Procedure: LEFT HEART CATH AND CORONARY ANGIOGRAPHY;  Surgeon: ENelva Bush MD;  [facility];  Service: Cardiovascular;  Laterality: N/A;   MICROLARYNGOSCOPY W/VOCAL CORD INJECTION N/A 08/07/2018   Procedure: MICROLARYNGOSCOPY WITH VOCAL CORD INJECTION OF PROLARYN;  Surgeon: BMelida Quitter MD;  [facility]  Service: ENT;  Laterality: N/A;  JET VENTILATION   PACEMAKER IMPLANT N/A 05/08/2022   Procedure: PACEMAKER IMPLANT;  Surgeon: CConstance Haw MD;  [facility];  Service: Cardiovascular;  Laterality: N/A;   REVERSE SHOULDER ARTHROPLASTY Right 03/23/2020   Procedure: REVERSE SHOULDER ARTHROPLASTY;  Surgeon: RNicholes Stairs MD;  [place] [facility]  Service: Orthopedics;  Laterality: Right;   [place]  Current Medications: Current Meds  Medication Sig   acetaminophen (TYLENOL) 500 MG tablet Take 500 mg by mouth daily as needed for headache.   apixaban (ELIQUIS) 2.5 MG TABS tablet Take 1 tablet (2.5 mg total) by mouth 2 (two) times daily.   aspirin 81 MG chewable tablet Chew 1 tablet (81 mg total) by mouth daily.   Cholecalciferol (VITAMIN D3) 2000 units TABS Take 2,000 Units by mouth daily.   dextromethorphan (DELSYM) 30 MG/5ML liquid Take 15 mg by mouth daily as needed for cough.   docusate sodium (COLACE) 100 MG capsule Take 1 capsule (100 mg total) by mouth 2 (two) times daily. (Patient taking differently: Take 100 mg by mouth 2 (two) times daily as needed (constipation).)   hydrALAZINE (APRESOLINE) 50 MG tablet Take 1 tablet (50 mg total) by mouth every 8 (eight) hours.   ipratropium (ATROVENT) 0.06 % nasal spray Place 2 sprays into both nostrils 2  (two) times daily.   isosorbide mononitrate (IMDUR) 30 MG 24 hr tablet Take 1 tablet (30 mg total) by mouth daily.   ketoconazole (NIZORAL) 2 % cream Apply 1 application topically 2 (two) times daily as needed for irritation.   ketotifen (ZADITOR) 0.025 % ophthalmic solution Place 1 drop into the right eye every morning.   Liniments (BLUE-EMU SUPER STRENGTH) CREA Apply 1 Application topically daily.   loratadine (CLARITIN) 10 MG tablet Take 10 mg by mouth daily as needed (allergies/sinus issues).    LORazepam (ATIVAN) 0.5 MG tablet TAKE 1 TABLET BY MOUTH EVERY 6 HOURS AS NEEDED FOR ANXIETY (Patient taking differently: Take 0.5 mg by mouth at bedtime.)   metoprolol succinate (TOPROL-XL) 25 MG 24 hr tablet Take 1 tablet (25 mg total) by mouth daily.   Multiple Vitamin (MULTIVITAMIN WITH MINERALS) TABS tablet Take 1 tablet by mouth daily.   nitroGLYCERIN (NITROSTAT) 0.4 MG SL tablet DISSOLVE ONE TABLET UNDER THE TONGUE EVERY 5 MINUTES AS NEEDED FOR CHEST PAIN.  DO NOT EXCEED A TOTAL OF 3 DOSES IN 15 MINUTES (Patient taking differently: Place 0.4 mg under the tongue every 5 (five) minutes as needed for chest pain.)   pantoprazole (PROTONIX) 40 MG tablet Take 1 tablet (40 mg total) by mouth daily. (Patient taking differently: Take 40 mg by mouth every morning.)   polyethylene glycol powder (GLYCOLAX/MIRALAX) 17 GM/SCOOP powder Take 17 g by mouth daily.   PRESCRIPTION MEDICATION See admin instructions. CPAP- At bedtime   Probiotic Product (PROBIOTIC GUMMIES PO) Take 2 tablets by mouth every morning.   rosuvastatin (CRESTOR) 40 MG tablet Take 1 tablet (40 mg total) by mouth daily. (Patient taking differently: Take 40 mg by mouth at bedtime.)   triamcinolone (NASACORT) 55 MCG/ACT nasal inhaler Place 1 spray into both nostrils daily as needed (for allergies).    [DISCONTINUED] amiodarone (PACERONE) 200 MG tablet Take 1 tablet (200 mg total) by mouth 2 (two) times daily.     Allergies:   Lidocaine,  Morphine, Procaine hcl, Sulfonamide derivatives, Norco [hydrocodone-acetaminophen], Tramadol, Vytorin [ezetimibe-simvastatin], and Tape   Social History   Socioeconomic History   Marital status: Married    Spouse name: Not on file   Number of children: 2   Years of education: Not on file   Highest education level: Not on file  Occupational History   Not on file  Tobacco Use   Smoking status: Former    Years: 40.00    Types: Cigarettes    Quit date: 11/04/1996    Years since quitting: 25.5  Smokeless tobacco: Never  Vaping Use   Vaping Use: Never used  Substance and Sexual Activity   Alcohol use: No    Alcohol/week: 0.0 standard drinks of alcohol   Drug use: No   Sexual activity: Not on file  Other Topics Concern   Not on file  Social History Narrative   Not on file   Social Determinants of Health   Financial Resource Strain: Low Risk  (06/06/2021)   Overall Financial Resource Strain (CARDIA)    Difficulty of Paying Living Expenses: Not hard at all  Food Insecurity: No Food Insecurity (06/06/2021)   Hunger Vital Sign    Worried About Running Out of Food in the Last Year: Never true    [place] in the Last Year: Never true  Transportation Needs: No Transportation Needs (06/06/2021)   PRAPARE - Hydrologist (Medical): No    Lack of Transportation (Non-Medical): No  Physical Activity: Inactive (06/06/2021)   Exercise Vital Sign    Days of Exercise per Week: 0 days    Minutes of Exercise per Session: 0 min  Stress: No Stress Concern Present (06/06/2021)   [place]    Feeling of Stress : Only a little  Social Connections: Moderately Integrated (06/06/2021)   Social Connection and Isolation Panel [NHANES]    Frequency of Communication with Friends and Family: More than three times a week    Frequency of Social Gatherings with Friends and Family: More than three times a week     Attends Religious Services: More than 4 times per year    Active Member of [employer] or Organizations: No    Attends Archivist Meetings: Never    Marital Status: Married     Family History: The patient's family history includes Cancer in her maternal grandmother; Heart disease in her maternal grandfather.  ROS:   Please see the history of present illness.     All other systems reviewed and are negative.  EKGs/Labs/Other Studies Reviewed:    The following studies were reviewed today:  Echo 05/04/2022  1. Left ventricular ejection fraction, by estimation, is >75%. The left  ventricle has hyperdynamic function. The left ventricle has no regional  wall motion abnormalities. Left ventricular diastolic parameters are  indeterminate. Elevated left atrial  pressure.   2. Right ventricular systolic function is normal. The right ventricular  size is normal. There is mildly elevated pulmonary artery systolic  pressure.   3. Left atrial size was moderately dilated.   4. The mitral valve is normal in structure. Moderate mitral valve  regurgitation. No evidence of mitral stenosis.   5. The aortic valve is tricuspid. Aortic valve regurgitation is not  visualized. Aortic valve sclerosis is present, with no evidence of aortic  valve stenosis.   6. The inferior vena cava is normal in size with greater than 50%  respiratory variability, suggesting right atrial pressure of 3 mmHg.   EKG:  EKG is not ordered today.    Recent Labs: 06/13/2021: TSH 2.09 05/04/2022: ALT 16 05/08/2022: B Natriuretic Peptide 520.4 05/10/2022: BUN 41; Creatinine, Ser 1.64; Hemoglobin 11.3; Magnesium 2.3; Platelets 188; Potassium 5.0; Sodium 135  Recent Lipid Panel    Component Value Date/Time   CHOL 148 06/13/2021 1459   TRIG 244.0 (H) 06/13/2021 1459   HDL 57.60 06/13/2021 1459   CHOLHDL 3 06/13/2021 1459   VLDL 48.8 (H) 06/13/2021 1459   LDLCALC 50 10/08/2020  9528   LDLDIRECT 54.0 06/13/2021 1459     Risk  Assessment/Calculations:    CHA2DS2-VASc Score = 5   This indicates a 7.2% annual risk of stroke. The patient's score is based upon: CHF History: 0 HTN History: 1 Diabetes History: 0 Stroke History: 0 Vascular Disease History: 1 Age Score: 2 Gender Score: 1          Physical Exam:    VS:  BP 140/74   Pulse 60   Ht '5\' 7"'$  (1.702 m)   Wt 178 lb 6.4 oz (80.9 kg)   SpO2 98%   BMI 27.94 kg/m     Wt Readings from Last 3 Encounters:  05/16/22 178 lb 6.4 oz (80.9 kg)  05/14/22 173 lb (78.5 kg)  05/10/22 175 lb 14.4 oz (79.8 kg)     GEN:  Well nourished, well developed in no acute distress HEENT: Normal NECK: No JVD; No carotid bruits LYMPHATICS: No lymphadenopathy CARDIAC: RRR, no murmurs, rubs, gallops RESPIRATORY:  Clear to auscultation without rales, wheezing or rhonchi  ABDOMEN: Soft, non-tender, non-distended MUSCULOSKELETAL:  No edema; No deformity  SKIN: Warm and dry NEUROLOGIC:  Alert and oriented x 3 PSYCHIATRIC:  Normal affect   ASSESSMENT:    1. Symptomatic bradycardia   2. Coronary artery disease involving native coronary artery of native heart without angina pectoris   3. Apical variant hypertrophic cardiomyopathy ([place])   4. PAF (paroxysmal atrial fibrillation) ([place])   5. Essential hypertension   6. Hyperlipidemia LDL goal <70    PLAN:    In order of problems listed above:  Symptomatic bradycardia: Status post recent Medtronic dual-chamber pacemaker.  CAD: Cardiac catheterization performed on 05/23/2022 showed nonobstructive CAD  Apical variant hypertrophic cardiomyopathy: Consider genetic consultation  PAF: Previously on Tikosyn, recently switched to amiodarone due to prolonged QT.  On Eliquis and metoprolol  Hypertension: Blood pressure stable  Hyperlipidemia: On Crestor           Medication Adjustments/Labs and Tests Ordered: Current medicines are reviewed at length with the patient today.  Concerns regarding medicines are outlined  above.  No orders of the defined types were placed in this encounter.  Meds ordered this encounter  Medications   amiodarone (PACERONE) 200 MG tablet    Sig: Take 1 tablet (200 mg total) by mouth daily.    Dispense:  90 tablet    Refill:  3    Patient Instructions  Medication Instructions:  Your physician has recommended you make the following change in your medication: As of AUGUST 7th, 2023 please DECREASE the AMIODARONE '200mg'$  ONCE DAILY.  *If you need a refill on your cardiac medications before your next appointment, please call your pharmacy*   Lab Work: NONE If you have labs (blood work) drawn today and your tests are completely normal, you will receive your results only by: [place] (if you have MyChart) OR A paper copy in the mail If you have any lab test that is abnormal or we need to change your treatment, we will call you to review the results.   Testing/Procedures: NONE   Follow-Up: At [facility], you and your health needs are our priority.  As part of our continuing mission to provide you with exceptional heart care, we have created designated Provider Care Teams.  These Care Teams include your primary Cardiologist (physician) and Advanced Practice Providers (APPs -  Physician Assistants and Nurse Practitioners) who all work together to provide you with the care you need, when you need  it.   Other Instructions Please keep follow up's as scheduled.    Hilbert Corrigan, [place]  05/18/2022 11:38 PM    [place]

## 2022-05-18 ENCOUNTER — Encounter: Payer: Self-pay | Admitting: Physician Assistant

## 2022-05-20 ENCOUNTER — Telehealth: Payer: Self-pay | Admitting: Cardiovascular Disease

## 2022-05-20 NOTE — Telephone Encounter (Signed)
Pt returning nurse's call. Please advise

## 2022-05-20 NOTE — Telephone Encounter (Signed)
Attempted to contact pt. Line continues to ring busy

## 2022-05-20 NOTE — Telephone Encounter (Signed)
STAT if patient feels like he/she is going to faint   Are you dizzy now? Yes  Do you feel faint or have you passed out? No   Do you have any other symptoms? Nausea   Have you checked your HR and BP (record if available)?  05/20/22 8:00 AM 142/80 HR 59 9:15 AM 139/84 HR 60    Patient states since 07/14 she has been having dizziness at night and in the mornings. She believes the medication changes made while she was in the hospital is causing this. Please advise.

## 2022-05-20 NOTE — Telephone Encounter (Signed)
Left a message on the patient's voicemail to call back.  Mobile number would not go through

## 2022-05-22 ENCOUNTER — Ambulatory Visit: Payer: Medicare Other

## 2022-05-22 ENCOUNTER — Encounter: Payer: Self-pay | Admitting: Student

## 2022-05-22 ENCOUNTER — Ambulatory Visit (INDEPENDENT_AMBULATORY_CARE_PROVIDER_SITE_OTHER): Payer: Medicare Other | Admitting: Student

## 2022-05-22 VITALS — BP 158/68 | HR 60 | Resp 16 | Ht 67.0 in | Wt 178.0 lb

## 2022-05-22 DIAGNOSIS — I10 Essential (primary) hypertension: Secondary | ICD-10-CM | POA: Diagnosis not present

## 2022-05-22 DIAGNOSIS — I214 Non-ST elevation (NSTEMI) myocardial infarction: Secondary | ICD-10-CM | POA: Diagnosis not present

## 2022-05-22 DIAGNOSIS — I2510 Atherosclerotic heart disease of native coronary artery without angina pectoris: Secondary | ICD-10-CM | POA: Diagnosis not present

## 2022-05-22 DIAGNOSIS — I509 Heart failure, unspecified: Secondary | ICD-10-CM | POA: Diagnosis not present

## 2022-05-22 DIAGNOSIS — I130 Hypertensive heart and chronic kidney disease with heart failure and stage 1 through stage 4 chronic kidney disease, or unspecified chronic kidney disease: Secondary | ICD-10-CM | POA: Diagnosis not present

## 2022-05-22 DIAGNOSIS — R001 Bradycardia, unspecified: Secondary | ICD-10-CM | POA: Diagnosis not present

## 2022-05-22 DIAGNOSIS — I480 Paroxysmal atrial fibrillation: Secondary | ICD-10-CM | POA: Diagnosis not present

## 2022-05-22 DIAGNOSIS — N1831 Chronic kidney disease, stage 3a: Secondary | ICD-10-CM | POA: Diagnosis not present

## 2022-05-22 LAB — CUP PACEART INCLINIC DEVICE CHECK
Battery Remaining Longevity: 153 mo
Battery Voltage: 3.22 V
Brady Statistic AP VP Percent: 0.03 %
Brady Statistic AP VS Percent: 80.53 %
Brady Statistic AS VP Percent: 0 %
Brady Statistic AS VS Percent: 19.43 %
Brady Statistic RA Percent Paced: 80.33 %
Brady Statistic RV Percent Paced: 0.04 %
Date Time Interrogation Session: 20230719114504
Implantable Lead Implant Date: 20230705
Implantable Lead Implant Date: 20230705
Implantable Lead Location: 753859
Implantable Lead Location: 753860
Implantable Lead Model: 3830
Implantable Lead Model: 5076
Implantable Pulse Generator Implant Date: 20230705
Lead Channel Impedance Value: 361 Ohm
Lead Channel Impedance Value: 399 Ohm
Lead Channel Impedance Value: 494 Ohm
Lead Channel Impedance Value: 551 Ohm
Lead Channel Pacing Threshold Amplitude: 0.875 V
Lead Channel Pacing Threshold Amplitude: 1.125 V
Lead Channel Pacing Threshold Pulse Width: 0.4 ms
Lead Channel Pacing Threshold Pulse Width: 0.4 ms
Lead Channel Sensing Intrinsic Amplitude: 1.75 mV
Lead Channel Sensing Intrinsic Amplitude: 13.75 mV
Lead Channel Sensing Intrinsic Amplitude: 16.125 mV
Lead Channel Sensing Intrinsic Amplitude: 2.25 mV
Lead Channel Setting Pacing Amplitude: 3.5 V
Lead Channel Setting Pacing Amplitude: 3.5 V
Lead Channel Setting Pacing Pulse Width: 0.4 ms
Lead Channel Setting Sensing Sensitivity: 0.9 mV

## 2022-05-22 NOTE — Patient Instructions (Signed)

## 2022-05-22 NOTE — Progress Notes (Signed)
Electrophysiology Office Note Date: 05/22/2022  ID:  Barbara [name], DOB [birth year], MRN [id number]  PCP: Laurey Morale, MD Primary Cardiologist: Shelva Majestic, MD Electrophysiologist: Constance Haw, MD  CC: Pacemaker follow-up  Barbara [name] is a 81 y.o. female seen today for Will Meredith Leeds, MD for post hospital follow up.    Admitted 6/30 -  05/10/2022 with chest pain and palpitations and was diagnosed with NSTEMI. Cath showed: Mild-moderate, non-obstructive coronary artery disease. Patent proximal/mid LAD stent with up to 30% in-stent restenosis. Low left ventricular filling pressure (LVEDP ~5 mmHg).  She was rechallenged with tikosyn but had QT prolongation, and was transitioned to oral amiodarone. In the presences of tachy-brady syndrome, she underwent DDD MDT PPM.   Seen by gen cards 7/13 for follow up. Was doing well overall. Declined hydralazine titration as she has previously tolerated lower BPs poorly. Recommendation given to decrease amiodarone to 200 mg daily on 8/7.  Since last being seen in our clinic the patient reports doing OK. She has started to have vertigo symptoms. Room spins rapidly when she she changes position quickly, including when lying in bed. She has mild SOB walking from car to the the office. Otherwise, she is doing OK. She denies chest pain, palpitations, PND, orthopnea, nausea, vomiting, syncope, edema, weight gain, or early satiety.  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 Shelva Majestic  cardiac stents - 2000   CHF (congestive heart failure) ([place])    Colon polyps    Complication of anesthesia    rash/hives with "caines"   Dyspnea    02/12/18 " when my heart gets out of rhythm, it has not been out of rhythym- since I have been on Tikosyn (11/2017)   Dysrhythmia    afib fib    GERD (gastroesophageal reflux disease)    takes OTC- Omeprazole- prn   Heart murmur    History of stress test    show normal perfusion without scar or ischemia, post EF 68%   Hx of echocardiogram    show an EF 55%-60% range with grade 1 diastolic dysfunction, she had mitral anular calcification with mild MR, moderate LA dilation and mild pulmonary hypertension with a PA estimated pressure of 31m   Hyperlipidemia    Hypertension    NSTEMI (non-ST elevated myocardial infarction) ([place]    Osteoarthritis    Pneumonia    hx of 2015    PONV (postoperative nausea and vomiting)    Past Surgical History:  Procedure Laterality Date   CARDIAC CATHETERIZATION     11/2017   cardiac stents  2000   COLONOSCOPY  01-05-14   per Dr. JTeena Irani clear, no repeats needed    COLONOSCOPY     CORONARY STENT PLACEMENT  2000   in LAD   DIRECT LARYNGOSCOPY WITH RADIAESSE INJECTION N/A 02/13/2018   Procedure: DIRECT LARYNGOSCOPY WITH RADIAESSE INJECTION;  Surgeon: BMelida Quitter MD;  Location: [facility]  Service: ENT;  Laterality: N/A;   EYE SURGERY Left    CATARACT REMOVAL   KNEE ARTHROSCOPY Left 01/06/2015   Procedure: LEFT KNEE ARTHROSCOPY, abrasion chondroplasty of the medial femerol condryl,medial and lateral menisectomy, microfracture , synovectomy of the suprpatellar pouch;  Surgeon: RLatanya Maudlin MD;  Location: [facility];  Service: Orthopedics;  Laterality: Left;   LEFT HEART CATH  AND CORONARY ANGIOGRAPHY N/A 02/26/2019   Procedure: LEFT HEART CATH AND CORONARY ANGIOGRAPHY;  Surgeon: Troy Sine, MD;  Location: [place] [facility];  Service: Cardiovascular;  Laterality: N/A;   LEFT HEART CATH AND CORONARY ANGIOGRAPHY N/A 05/06/2022   Procedure: LEFT HEART CATH AND CORONARY ANGIOGRAPHY;  Surgeon: Nelva Bush, MD;  Location: [place] [facility];  Service: Cardiovascular;  Laterality: N/A;   MICROLARYNGOSCOPY W/VOCAL CORD INJECTION N/A 08/07/2018   Procedure: MICROLARYNGOSCOPY WITH VOCAL CORD INJECTION OF  PROLARYN;  Surgeon: Melida Quitter, MD;  Location: [place];  Service: ENT;  Laterality: N/A;  JET VENTILATION   PACEMAKER IMPLANT N/A 05/08/2022   Procedure: PACEMAKER IMPLANT;  Surgeon: Constance Haw, MD;  Location: [place] [facility];  Service: Cardiovascular;  Laterality: N/A;   REVERSE SHOULDER ARTHROPLASTY Right 03/23/2020   Procedure: REVERSE SHOULDER ARTHROPLASTY;  Surgeon: Nicholes Stairs, MD;  Location: [place];  Service: Orthopedics;  Laterality: Right;   VAGINAL HYSTERECTOMY  1971    Current Outpatient Medications  Medication Sig Dispense Refill   acetaminophen (TYLENOL) 500 MG tablet Take 500 mg by mouth daily as needed for headache.     [START ON 06/10/2022] amiodarone (PACERONE) 200 MG tablet Take 1 tablet (200 mg total) by mouth daily. 90 tablet 3   apixaban (ELIQUIS) 2.5 MG TABS tablet Take 1 tablet (2.5 mg total) by mouth 2 (two) times daily. 60 tablet 5   aspirin 81 MG chewable tablet Chew 1 tablet (81 mg total) by mouth daily. 30 tablet 0   Cholecalciferol (VITAMIN D3) 2000 units TABS Take 2,000 Units by mouth daily.     dextromethorphan (DELSYM) 30 MG/5ML liquid Take 15 mg by mouth daily as needed for cough.     docusate sodium (COLACE) 100 MG capsule Take 1 capsule (100 mg total) by mouth 2 (two) times daily. (Patient taking differently: Take 100 mg by mouth 2 (two) times daily as needed (constipation).) 60 capsule 5   hydrALAZINE (APRESOLINE) 50 MG tablet Take 1 tablet (50 mg total) by mouth every 8 (eight) hours. 90 tablet 0   ipratropium (ATROVENT) 0.06 % nasal spray Place 2 sprays into both nostrils 2 (two) times daily.  5   isosorbide mononitrate (IMDUR) 30 MG 24 hr tablet Take 1 tablet (30 mg total) by mouth daily. 30 tablet 0   ketoconazole (NIZORAL) 2 % cream Apply 1 application topically 2 (two) times daily as needed for irritation. 60 g 5   ketotifen (ZADITOR) 0.025 % ophthalmic solution Place 1 drop into the right eye every morning.     Liniments (BLUE-EMU  SUPER STRENGTH) CREA Apply 1 Application topically daily.     loratadine (CLARITIN) 10 MG tablet Take 10 mg by mouth daily as needed (allergies/sinus issues).      LORazepam (ATIVAN) 0.5 MG tablet TAKE 1 TABLET BY MOUTH EVERY 6 HOURS AS NEEDED FOR ANXIETY (Patient taking differently: Take 0.5 mg by mouth at bedtime.) 60 tablet 5   metoprolol succinate (TOPROL-XL) 25 MG 24 hr tablet Take 1 tablet (25 mg total) by mouth daily. 30 tablet 0   Multiple Vitamin (MULTIVITAMIN WITH MINERALS) TABS tablet Take 1 tablet by mouth daily.     nitroGLYCERIN (NITROSTAT) 0.4 MG SL tablet DISSOLVE ONE TABLET UNDER THE TONGUE EVERY 5 MINUTES AS NEEDED FOR CHEST PAIN.  DO NOT EXCEED A TOTAL OF 3 DOSES IN 15 MINUTES (Patient taking differently: Place 0.4 mg under the tongue every 5 (five) minutes as needed for chest pain.) 25  tablet 3   pantoprazole (PROTONIX) 40 MG tablet Take 1 tablet (40 mg total) by mouth daily. (Patient taking differently: Take 40 mg by mouth every morning.) 30 tablet 10   polyethylene glycol powder (GLYCOLAX/MIRALAX) 17 GM/SCOOP powder Take 17 g by mouth daily. 238 g 0   PRESCRIPTION MEDICATION See admin instructions. CPAP- At bedtime     Probiotic Product (PROBIOTIC GUMMIES PO) Take 2 tablets by mouth every morning.     rosuvastatin (CRESTOR) 40 MG tablet Take 1 tablet (40 mg total) by mouth daily. (Patient taking differently: Take 40 mg by mouth at bedtime.) 90 tablet 3   triamcinolone (NASACORT) 55 MCG/ACT nasal inhaler Place 1 spray into both nostrils daily as needed (for allergies).      No current facility-administered medications for this visit.    Allergies:   Lidocaine, Morphine, Procaine hcl, Sulfonamide derivatives, Norco [hydrocodone-acetaminophen], Tramadol, Vytorin [ezetimibe-simvastatin], and Tape   Social History: Social History   Socioeconomic History   Marital status: Married    Spouse name: Not on file   Number of children: 2   Years of education: Not on file   Highest  education level: Not on file  Occupational History   Not on file  Tobacco Use   Smoking status: Former    Years: 40.00    Types: Cigarettes    Quit date: 11/04/1996    Years since quitting: 25.5   Smokeless tobacco: Never  Vaping Use   Vaping Use: Never used  Substance and Sexual Activity   Alcohol use: No    Alcohol/week: 0.0 standard drinks of alcohol   Drug use: No   Sexual activity: Not on file  Other Topics Concern   Not on file  Social History Narrative   Not on file   Social Determinants of Health   Financial Resource Strain: Low Risk  (06/06/2021)   Overall Financial Resource Strain (CARDIA)    Difficulty of Paying Living Expenses: Not hard at all  Food Insecurity: No Food Insecurity (06/06/2021)   Hunger Vital Sign    Worried About Running Out of Food in the Last Year: Never true    [place] in the Last Year: Never true  Transportation Needs: No Transportation Needs (06/06/2021)   PRAPARE - Hydrologist (Medical): No    Lack of Transportation (Non-Medical): No  Physical Activity: Inactive (06/06/2021)   Exercise Vital Sign    Days of Exercise per Week: 0 days    Minutes of Exercise per Session: 0 min  Stress: No Stress Concern Present (06/06/2021)   [place]    Feeling of Stress : Only a little  Social Connections: Moderately Integrated (06/06/2021)   Social Connection and Isolation Panel [NHANES]    Frequency of Communication with Friends and Family: More than three times a week    Frequency of Social Gatherings with Friends and Family: More than three times a week    Attends Religious Services: More than 4 times per year    Active Member of [employer] or Organizations: No    Attends Archivist Meetings: Never    Marital Status: Married  Human resources officer Violence: Not At Risk (06/06/2021)   Humiliation, Afraid, Rape, and Kick questionnaire    Fear of Current or  Ex-Partner: No    Emotionally Abused: No    Physically Abused: No    Sexually Abused: No    Family History: Family History  Problem Relation Age of Onset   Cancer Maternal Grandmother    Heart disease Maternal Grandfather    Review of Systems: All other systems reviewed and are otherwise negative except as noted above.  Physical Exam: Vitals:   05/22/22 1128  BP: (!) 158/68  Pulse: 60  Resp: 16  SpO2: 97%  Weight: 178 lb (80.7 kg)  Height: '5\' 7"'$  (1.702 m)     GEN- The patient is well appearing, alert and oriented x 3 today.   HEENT: normocephalic, atraumatic; sclera clear, conjunctiva pink; hearing intact; oropharynx clear; neck supple  Lungs- Clear to ausculation bilaterally, normal work of breathing.  No wheezes, rales, rhonchi Heart- Regular rate and rhythm, no murmurs, rubs or gallops  GI- soft, non-tender, non-distended, bowel sounds present  Extremities- no clubbing or cyanosis. No edema MS- no significant deformity or atrophy Skin- warm and dry, no rash or lesion; PPM pocket well healed Psych- euthymic mood, full affect Neuro- strength and sensation are intact  PPM Interrogation- reviewed in detail today,  See PACEART report  EKG:  EKG is not ordered today.  Recent Labs: 06/13/2021: TSH 2.09 05/04/2022: ALT 16 05/08/2022: B Natriuretic Peptide 520.4 05/10/2022: BUN 41; Creatinine, Ser 1.64; Hemoglobin 11.3; Magnesium 2.3; Platelets 188; Potassium 5.0; Sodium 135   Wt Readings from Last 3 Encounters:  05/22/22 178 lb (80.7 kg)  05/16/22 178 lb 6.4 oz (80.9 kg)  05/14/22 173 lb (78.5 kg)     Other studies Reviewed: Additional studies/ records that were reviewed today include: Previous EP office notes, Previous remote checks, Most recent labwork.   Assessment and Plan:  1. Tachy-Brady syndrome s/p Medtronic PPM  Normal PPM function See Pace Art report No changes today  2. CAD Moderate, non-obstructive disease  3. PAF Plan on downtitrating amiodarone 200  mg daily on 8/7. Continue Eliquis, dosed appropriately for age and Cr.   4. HTN Elevated on arrival but better at home. She would like to continue to convalesce from recent admission, stating she has symptoms with BPs <035 systolic.   Current medicines are reviewed at length with the patient today.    Disposition:   Follow up with EP APP in 4 weeks    Signed, Shirley Friar, PA-C  05/22/2022 11:29 AM  [facility] [address] [place] [address] [phone] (office) [phone] (fax)

## 2022-05-22 NOTE — Telephone Encounter (Signed)
error 

## 2022-05-27 NOTE — Telephone Encounter (Signed)
Patient seen in office on 07/19-  Will not call back.  Thanks!

## 2022-05-29 DIAGNOSIS — I495 Sick sinus syndrome: Secondary | ICD-10-CM | POA: Diagnosis not present

## 2022-05-29 DIAGNOSIS — E785 Hyperlipidemia, unspecified: Secondary | ICD-10-CM | POA: Diagnosis not present

## 2022-05-29 DIAGNOSIS — Z955 Presence of coronary angioplasty implant and graft: Secondary | ICD-10-CM

## 2022-05-29 DIAGNOSIS — Z8701 Personal history of pneumonia (recurrent): Secondary | ICD-10-CM

## 2022-05-29 DIAGNOSIS — N1831 Chronic kidney disease, stage 3a: Secondary | ICD-10-CM | POA: Diagnosis not present

## 2022-05-29 DIAGNOSIS — Z9181 History of falling: Secondary | ICD-10-CM

## 2022-05-29 DIAGNOSIS — I480 Paroxysmal atrial fibrillation: Secondary | ICD-10-CM | POA: Diagnosis not present

## 2022-05-29 DIAGNOSIS — Z86010 Personal history of colonic polyps: Secondary | ICD-10-CM

## 2022-05-29 DIAGNOSIS — Z950 Presence of cardiac pacemaker: Secondary | ICD-10-CM | POA: Diagnosis not present

## 2022-05-29 DIAGNOSIS — K219 Gastro-esophageal reflux disease without esophagitis: Secondary | ICD-10-CM | POA: Diagnosis not present

## 2022-05-29 DIAGNOSIS — I272 Pulmonary hypertension, unspecified: Secondary | ICD-10-CM | POA: Diagnosis not present

## 2022-05-29 DIAGNOSIS — I130 Hypertensive heart and chronic kidney disease with heart failure and stage 1 through stage 4 chronic kidney disease, or unspecified chronic kidney disease: Secondary | ICD-10-CM | POA: Diagnosis not present

## 2022-05-29 DIAGNOSIS — Z9981 Dependence on supplemental oxygen: Secondary | ICD-10-CM

## 2022-05-29 DIAGNOSIS — G4733 Obstructive sleep apnea (adult) (pediatric): Secondary | ICD-10-CM | POA: Diagnosis not present

## 2022-05-29 DIAGNOSIS — I214 Non-ST elevation (NSTEMI) myocardial infarction: Secondary | ICD-10-CM | POA: Diagnosis not present

## 2022-05-29 DIAGNOSIS — Z7901 Long term (current) use of anticoagulants: Secondary | ICD-10-CM

## 2022-05-29 DIAGNOSIS — I2510 Atherosclerotic heart disease of native coronary artery without angina pectoris: Secondary | ICD-10-CM | POA: Diagnosis not present

## 2022-05-29 DIAGNOSIS — Z7982 Long term (current) use of aspirin: Secondary | ICD-10-CM

## 2022-05-29 DIAGNOSIS — I509 Heart failure, unspecified: Secondary | ICD-10-CM | POA: Diagnosis not present

## 2022-06-05 ENCOUNTER — Other Ambulatory Visit: Payer: Self-pay

## 2022-06-05 ENCOUNTER — Telehealth: Payer: Self-pay | Admitting: Cardiovascular Disease

## 2022-06-05 DIAGNOSIS — N1831 Chronic kidney disease, stage 3a: Secondary | ICD-10-CM | POA: Diagnosis not present

## 2022-06-05 DIAGNOSIS — I130 Hypertensive heart and chronic kidney disease with heart failure and stage 1 through stage 4 chronic kidney disease, or unspecified chronic kidney disease: Secondary | ICD-10-CM | POA: Diagnosis not present

## 2022-06-05 DIAGNOSIS — I509 Heart failure, unspecified: Secondary | ICD-10-CM | POA: Diagnosis not present

## 2022-06-05 DIAGNOSIS — I2510 Atherosclerotic heart disease of native coronary artery without angina pectoris: Secondary | ICD-10-CM | POA: Diagnosis not present

## 2022-06-05 DIAGNOSIS — I214 Non-ST elevation (NSTEMI) myocardial infarction: Secondary | ICD-10-CM | POA: Diagnosis not present

## 2022-06-05 MED ORDER — METOPROLOL SUCCINATE ER 25 MG PO TB24: 25.0000 mg | ORAL_TABLET | Freq: Every day | ORAL | 3 refills | Status: DC

## 2022-06-05 NOTE — Telephone Encounter (Signed)
Pt c/o swelling: STAT is pt has developed SOB within 24 hours  If swelling, where is the swelling located?  Legs amd ankles  How much weight have you gained and in what time span? 4 lbs in 6 days  Have you gained 3 pounds in a day or 5 pounds in a week? 4 lbs iin 6days  Do you have a log of your daily weights (if so, list)? no  Are you currently taking a fluid pill? no  Are you currently SOB? no  Have you traveled recently? no

## 2022-06-05 NOTE — Telephone Encounter (Signed)
Called PT back- spoke with him and patient. Patient states she has had bilateral swelling in her legs, she has noticed an increase in this. She states over night the swelling improves, but during the day the swelling comes back.  She states her weight this morning was 177.8 lb, last OV she was 178. she is not short of breath, no changes to medications, she is not using her compression stockings although she has them, she is elevating her legs during the day and will continue to do this. She does state over the last week her stove was broken and she had to have a new one brought in so they have been eating out this week. We did discuss the leg swelling could be due to the sodium intake - as take out food is high in this. Patient verbalized understanding. She also weighs everyday but does not write it down, we did discuss to start doing this, and to call back if her weight increased 3 lbs in a day or 5 lbs in a week. Patient verbalized understanding of this.   PT verbalized plan as well.   Thankful for call back. Notified I would let Dr.Kelly know.

## 2022-06-07 ENCOUNTER — Ambulatory Visit: Payer: Medicare Other | Admitting: Podiatry

## 2022-06-09 ENCOUNTER — Emergency Department (HOSPITAL_COMMUNITY): Payer: Medicare Other

## 2022-06-09 ENCOUNTER — Encounter (HOSPITAL_COMMUNITY): Payer: Self-pay

## 2022-06-09 ENCOUNTER — Other Ambulatory Visit: Payer: Self-pay

## 2022-06-09 ENCOUNTER — Inpatient Hospital Stay (HOSPITAL_COMMUNITY)
Admission: EM | Admit: 2022-06-09 | Discharge: 2022-06-14 | DRG: 659 | Disposition: A | Discharge status: Home Health | Payer: Medicare Other | Attending: Internal Medicine | Admitting: Internal Medicine

## 2022-06-09 DIAGNOSIS — J810 Acute pulmonary edema: Secondary | ICD-10-CM | POA: Diagnosis not present

## 2022-06-09 DIAGNOSIS — N179 Acute kidney failure, unspecified: Secondary | ICD-10-CM | POA: Diagnosis not present

## 2022-06-09 DIAGNOSIS — Z885 Allergy status to narcotic agent status: Secondary | ICD-10-CM

## 2022-06-09 DIAGNOSIS — J9601 Acute respiratory failure with hypoxia: Secondary | ICD-10-CM | POA: Diagnosis present

## 2022-06-09 DIAGNOSIS — N200 Calculus of kidney: Secondary | ICD-10-CM

## 2022-06-09 DIAGNOSIS — N1832 Chronic kidney disease, stage 3b: Secondary | ICD-10-CM | POA: Diagnosis present

## 2022-06-09 DIAGNOSIS — Z20822 Contact with and (suspected) exposure to covid-19: Secondary | ICD-10-CM | POA: Diagnosis not present

## 2022-06-09 DIAGNOSIS — G4733 Obstructive sleep apnea (adult) (pediatric): Secondary | ICD-10-CM

## 2022-06-09 DIAGNOSIS — I5033 Acute on chronic diastolic (congestive) heart failure: Secondary | ICD-10-CM | POA: Diagnosis present

## 2022-06-09 DIAGNOSIS — E1122 Type 2 diabetes mellitus with diabetic chronic kidney disease: Secondary | ICD-10-CM | POA: Diagnosis present

## 2022-06-09 DIAGNOSIS — Z955 Presence of coronary angioplasty implant and graft: Secondary | ICD-10-CM

## 2022-06-09 DIAGNOSIS — I495 Sick sinus syndrome: Secondary | ICD-10-CM | POA: Diagnosis not present

## 2022-06-09 DIAGNOSIS — Z882 Allergy status to sulfonamides status: Secondary | ICD-10-CM

## 2022-06-09 DIAGNOSIS — I248 Other forms of acute ischemic heart disease: Secondary | ICD-10-CM | POA: Diagnosis not present

## 2022-06-09 DIAGNOSIS — E785 Hyperlipidemia, unspecified: Secondary | ICD-10-CM | POA: Diagnosis not present

## 2022-06-09 DIAGNOSIS — N134 Hydroureter: Secondary | ICD-10-CM | POA: Diagnosis not present

## 2022-06-09 DIAGNOSIS — I422 Other hypertrophic cardiomyopathy: Secondary | ICD-10-CM | POA: Diagnosis not present

## 2022-06-09 DIAGNOSIS — I480 Paroxysmal atrial fibrillation: Secondary | ICD-10-CM | POA: Diagnosis present

## 2022-06-09 DIAGNOSIS — I129 Hypertensive chronic kidney disease with stage 1 through stage 4 chronic kidney disease, or unspecified chronic kidney disease: Secondary | ICD-10-CM | POA: Diagnosis not present

## 2022-06-09 DIAGNOSIS — I10 Essential (primary) hypertension: Secondary | ICD-10-CM | POA: Diagnosis present

## 2022-06-09 DIAGNOSIS — Z950 Presence of cardiac pacemaker: Secondary | ICD-10-CM

## 2022-06-09 DIAGNOSIS — R0902 Hypoxemia: Secondary | ICD-10-CM | POA: Diagnosis not present

## 2022-06-09 DIAGNOSIS — Z7901 Long term (current) use of anticoagulants: Secondary | ICD-10-CM

## 2022-06-09 DIAGNOSIS — I2510 Atherosclerotic heart disease of native coronary artery without angina pectoris: Secondary | ICD-10-CM | POA: Diagnosis not present

## 2022-06-09 DIAGNOSIS — Z79899 Other long term (current) drug therapy: Secondary | ICD-10-CM

## 2022-06-09 DIAGNOSIS — N132 Hydronephrosis with renal and ureteral calculous obstruction: Principal | ICD-10-CM | POA: Diagnosis present

## 2022-06-09 DIAGNOSIS — Z8249 Family history of ischemic heart disease and other diseases of the circulatory system: Secondary | ICD-10-CM

## 2022-06-09 DIAGNOSIS — D631 Anemia in chronic kidney disease: Secondary | ICD-10-CM | POA: Diagnosis not present

## 2022-06-09 DIAGNOSIS — Z888 Allergy status to other drugs, medicaments and biological substances status: Secondary | ICD-10-CM

## 2022-06-09 DIAGNOSIS — I4892 Unspecified atrial flutter: Secondary | ICD-10-CM | POA: Diagnosis not present

## 2022-06-09 DIAGNOSIS — Z96611 Presence of right artificial shoulder joint: Secondary | ICD-10-CM | POA: Diagnosis present

## 2022-06-09 DIAGNOSIS — I130 Hypertensive heart and chronic kidney disease with heart failure and stage 1 through stage 4 chronic kidney disease, or unspecified chronic kidney disease: Secondary | ICD-10-CM | POA: Diagnosis present

## 2022-06-09 DIAGNOSIS — E7800 Pure hypercholesterolemia, unspecified: Secondary | ICD-10-CM | POA: Diagnosis not present

## 2022-06-09 DIAGNOSIS — I252 Old myocardial infarction: Secondary | ICD-10-CM

## 2022-06-09 DIAGNOSIS — Z87891 Personal history of nicotine dependence: Secondary | ICD-10-CM

## 2022-06-09 DIAGNOSIS — Y831 Surgical operation with implant of artificial internal device as the cause of abnormal reaction of the patient, or of later complication, without mention of misadventure at the time of the procedure: Secondary | ICD-10-CM | POA: Diagnosis present

## 2022-06-09 DIAGNOSIS — K219 Gastro-esophageal reflux disease without esophagitis: Secondary | ICD-10-CM | POA: Diagnosis present

## 2022-06-09 DIAGNOSIS — N202 Calculus of kidney with calculus of ureter: Secondary | ICD-10-CM | POA: Diagnosis not present

## 2022-06-09 DIAGNOSIS — R6889 Other general symptoms and signs: Secondary | ICD-10-CM | POA: Diagnosis not present

## 2022-06-09 DIAGNOSIS — Z90710 Acquired absence of both cervix and uterus: Secondary | ICD-10-CM

## 2022-06-09 DIAGNOSIS — N183 Chronic kidney disease, stage 3 unspecified: Secondary | ICD-10-CM | POA: Diagnosis present

## 2022-06-09 DIAGNOSIS — Z01810 Encounter for preprocedural cardiovascular examination: Secondary | ICD-10-CM | POA: Diagnosis not present

## 2022-06-09 DIAGNOSIS — R197 Diarrhea, unspecified: Secondary | ICD-10-CM | POA: Diagnosis not present

## 2022-06-09 DIAGNOSIS — Z7982 Long term (current) use of aspirin: Secondary | ICD-10-CM | POA: Diagnosis not present

## 2022-06-09 DIAGNOSIS — Z743 Need for continuous supervision: Secondary | ICD-10-CM | POA: Diagnosis not present

## 2022-06-09 DIAGNOSIS — I509 Heart failure, unspecified: Secondary | ICD-10-CM | POA: Diagnosis not present

## 2022-06-09 DIAGNOSIS — N201 Calculus of ureter: Secondary | ICD-10-CM | POA: Diagnosis not present

## 2022-06-09 DIAGNOSIS — I700 Atherosclerosis of aorta: Secondary | ICD-10-CM | POA: Diagnosis not present

## 2022-06-09 DIAGNOSIS — N189 Chronic kidney disease, unspecified: Secondary | ICD-10-CM | POA: Diagnosis not present

## 2022-06-09 DIAGNOSIS — Z87442 Personal history of urinary calculi: Secondary | ICD-10-CM

## 2022-06-09 DIAGNOSIS — Z884 Allergy status to anesthetic agent status: Secondary | ICD-10-CM

## 2022-06-09 DIAGNOSIS — T82855D Stenosis of coronary artery stent, subsequent encounter: Secondary | ICD-10-CM

## 2022-06-09 DIAGNOSIS — J811 Chronic pulmonary edema: Secondary | ICD-10-CM | POA: Diagnosis not present

## 2022-06-09 DIAGNOSIS — R109 Unspecified abdominal pain: Secondary | ICD-10-CM | POA: Diagnosis not present

## 2022-06-09 DIAGNOSIS — R319 Hematuria, unspecified: Secondary | ICD-10-CM | POA: Diagnosis not present

## 2022-06-09 DIAGNOSIS — R1032 Left lower quadrant pain: Secondary | ICD-10-CM | POA: Diagnosis not present

## 2022-06-09 LAB — URINALYSIS, ROUTINE W REFLEX MICROSCOPIC
Bilirubin Urine: NEGATIVE
Glucose, UA: NEGATIVE mg/dL
Ketones, ur: NEGATIVE mg/dL
Nitrite: NEGATIVE
Protein, ur: NEGATIVE mg/dL
Specific Gravity, Urine: 1.01 (ref 1.005–1.030)
pH: 6.5 (ref 5.0–8.0)

## 2022-06-09 LAB — COMPREHENSIVE METABOLIC PANEL
ALT: 18 U/L (ref 0–44)
AST: 24 U/L (ref 15–41)
Albumin: 3.8 g/dL (ref 3.5–5.0)
Alkaline Phosphatase: 85 U/L (ref 38–126)
Anion gap: 9 (ref 5–15)
BUN: 22 mg/dL (ref 8–23)
CO2: 23 mmol/L (ref 22–32)
Calcium: 9.8 mg/dL (ref 8.9–10.3)
Chloride: 106 mmol/L (ref 98–111)
Creatinine, Ser: 1.61 mg/dL — ABNORMAL HIGH (ref 0.44–1.00)
GFR, Estimated: 32 mL/min — ABNORMAL LOW (ref >60)
Glucose, Bld: 145 mg/dL — ABNORMAL HIGH (ref 70–99)
Potassium: 3.7 mmol/L (ref 3.5–5.1)
Sodium: 138 mmol/L (ref 135–145)
Total Bilirubin: 0.7 mg/dL (ref 0.3–1.2)
Total Protein: 7.4 g/dL (ref 6.5–8.1)

## 2022-06-09 LAB — CBC WITH DIFFERENTIAL/PLATELET
Abs Immature Granulocytes: 0.03 10*3/uL (ref 0.00–0.07)
Basophils Absolute: 0 10*3/uL (ref 0.0–0.1)
Basophils Relative: 0 %
Eosinophils Absolute: 0 10*3/uL (ref 0.0–0.5)
Eosinophils Relative: 0 %
HCT: 39.4 % (ref 36.0–46.0)
Hemoglobin: 12.7 g/dL (ref 12.0–15.0)
Immature Granulocytes: 0 %
Lymphocytes Relative: 7 %
Lymphs Abs: 0.7 10*3/uL (ref 0.7–4.0)
MCH: 29.1 pg (ref 26.0–34.0)
MCHC: 32.2 g/dL (ref 30.0–36.0)
MCV: 90.4 fL (ref 80.0–100.0)
Monocytes Absolute: 0.5 10*3/uL (ref 0.1–1.0)
Monocytes Relative: 5 %
Neutro Abs: 9.7 10*3/uL — ABNORMAL HIGH (ref 1.7–7.7)
Neutrophils Relative %: 88 %
Platelets: 213 10*3/uL (ref 150–400)
RBC: 4.36 MIL/uL (ref 3.87–5.11)
RDW: 13.4 % (ref 11.5–15.5)
WBC: 11 10*3/uL — ABNORMAL HIGH (ref 4.0–10.5)
nRBC: 0 % (ref 0.0–0.2)

## 2022-06-09 LAB — LACTIC ACID, PLASMA: Lactic Acid, Venous: 1.3 mmol/L (ref 0.5–1.9)

## 2022-06-09 LAB — URINALYSIS, MICROSCOPIC (REFLEX)
Bacteria, UA: NONE SEEN
RBC / HPF: >50 RBC/hpf (ref 0–5)

## 2022-06-09 LAB — TROPONIN I (HIGH SENSITIVITY)
Troponin I (High Sensitivity): 27 ng/L — ABNORMAL HIGH (ref <18)
Troponin I (High Sensitivity): 40 ng/L — ABNORMAL HIGH (ref <18)

## 2022-06-09 LAB — BRAIN NATRIURETIC PEPTIDE: B Natriuretic Peptide: 593.4 pg/mL — ABNORMAL HIGH (ref 0.0–100.0)

## 2022-06-09 LAB — MAGNESIUM
Magnesium: 2 mg/dL (ref 1.7–2.4)
Magnesium: 1.9 mg/dL (ref 1.7–2.4)

## 2022-06-09 LAB — LIPASE, BLOOD: Lipase: 32 U/L (ref 11–51)

## 2022-06-09 MED ORDER — ONDANSETRON HCL 4 MG PO TABS: 4.0000 mg | ORAL_TABLET | Freq: Four times a day (QID) | ORAL | Status: DC | PRN

## 2022-06-09 MED ORDER — ONDANSETRON HCL 4 MG/2ML IJ SOLN
4.0000 mg | Freq: Once | INTRAMUSCULAR | Status: AC
  Administered 2022-06-09: 4 mg via INTRAVENOUS
  Filled 2022-06-09: qty 2

## 2022-06-09 MED ORDER — FUROSEMIDE 10 MG/ML IJ SOLN
40.0000 mg | Freq: Every day | INTRAMUSCULAR | Status: DC
  Administered 2022-06-10 – 2022-06-11 (×2): 40 mg via INTRAVENOUS
  Filled 2022-06-09 (×2): qty 4

## 2022-06-09 MED ORDER — HYDRALAZINE HCL 20 MG/ML IJ SOLN
10.0000 mg | Freq: Once | INTRAMUSCULAR | Status: AC
  Administered 2022-06-09: 10 mg via INTRAVENOUS
  Filled 2022-06-09: qty 1

## 2022-06-09 MED ORDER — ACETAMINOPHEN 650 MG RE SUPP: 650.0000 mg | Freq: Four times a day (QID) | RECTAL | Status: DC | PRN

## 2022-06-09 MED ORDER — LABETALOL HCL 5 MG/ML IV SOLN
10.0000 mg | INTRAVENOUS | Status: DC | PRN
  Administered 2022-06-09 – 2022-06-12 (×5): 10 mg via INTRAVENOUS
  Filled 2022-06-09 (×5): qty 4

## 2022-06-09 MED ORDER — SODIUM CHLORIDE 0.9 % IV SOLN: 250.0000 mL | INTRAVENOUS | Status: DC | PRN

## 2022-06-09 MED ORDER — LIP MEDEX EX OINT
TOPICAL_OINTMENT | CUTANEOUS | Status: DC | PRN
  Filled 2022-06-09: qty 7

## 2022-06-09 MED ORDER — HYDROMORPHONE HCL 1 MG/ML IJ SOLN
0.5000 mg | INTRAMUSCULAR | Status: DC | PRN
  Administered 2022-06-09 – 2022-06-10 (×5): 0.5 mg via INTRAVENOUS
  Filled 2022-06-09 (×5): qty 0.5

## 2022-06-09 MED ORDER — FENTANYL CITRATE PF 50 MCG/ML IJ SOSY
50.0000 ug | PREFILLED_SYRINGE | Freq: Once | INTRAMUSCULAR | Status: AC
  Administered 2022-06-09: 50 ug via INTRAVENOUS
  Filled 2022-06-09: qty 1

## 2022-06-09 MED ORDER — HYDRALAZINE HCL 25 MG PO TABS
25.0000 mg | ORAL_TABLET | ORAL | Status: DC | PRN
  Administered 2022-06-09: 25 mg via ORAL
  Filled 2022-06-09: qty 1

## 2022-06-09 MED ORDER — IOHEXOL 300 MG/ML  SOLN
75.0000 mL | Freq: Once | INTRAMUSCULAR | Status: AC | PRN
  Administered 2022-06-09: 75 mL via INTRAVENOUS

## 2022-06-09 MED ORDER — HYDRALAZINE HCL 20 MG/ML IJ SOLN: 10.0000 mg | Freq: Three times a day (TID) | INTRAMUSCULAR | Status: DC | PRN

## 2022-06-09 MED ORDER — FUROSEMIDE 10 MG/ML IJ SOLN
40.0000 mg | Freq: Once | INTRAMUSCULAR | Status: AC
  Administered 2022-06-09: 40 mg via INTRAVENOUS
  Filled 2022-06-09: qty 4

## 2022-06-09 MED ORDER — ACETAMINOPHEN 325 MG PO TABS
650.0000 mg | ORAL_TABLET | Freq: Four times a day (QID) | ORAL | Status: DC | PRN
  Administered 2022-06-10: 650 mg via ORAL
  Filled 2022-06-09: qty 2

## 2022-06-09 MED ORDER — ONDANSETRON HCL 4 MG/2ML IJ SOLN
4.0000 mg | Freq: Four times a day (QID) | INTRAMUSCULAR | Status: DC | PRN
  Administered 2022-06-09 – 2022-06-10 (×3): 4 mg via INTRAVENOUS
  Filled 2022-06-09 (×3): qty 2

## 2022-06-09 MED ORDER — SODIUM CHLORIDE 0.9% FLUSH
3.0000 mL | Freq: Two times a day (BID) | INTRAVENOUS | Status: DC
  Administered 2022-06-09 – 2022-06-14 (×11): 3 mL via INTRAVENOUS

## 2022-06-09 MED ORDER — SODIUM CHLORIDE 0.9% FLUSH: 3.0000 mL | INTRAVENOUS | Status: DC | PRN

## 2022-06-09 MED ORDER — HYDROMORPHONE HCL 1 MG/ML IJ SOLN
0.5000 mg | Freq: Once | INTRAMUSCULAR | Status: AC
  Administered 2022-06-09: 0.5 mg via INTRAVENOUS
  Filled 2022-06-09: qty 1

## 2022-06-09 NOTE — Assessment & Plan Note (Addendum)
05/03/22: NSTEMI. Cath at that time showed: Mild-moderate, non-obstructive coronary artery disease. And Patent proximal/mid LAD stent with up to 30% in-stent restenosis. Mild, non obstructive disease Initial troponin mildly elevated at 27, delta pending. Has no chest pain. Likely elevated with demand ischemia in setting of acute respiratory failure. F/u on delta. LHC 1 month ago.  Continue medical management with imdur, toprol and crestor  Hold ASA in case needs surgical intervention with stone

## 2022-06-09 NOTE — Assessment & Plan Note (Addendum)
New respiratory failure with hypoxia requiring 2-3L oxygen, elevated BNP and findings on CXR of edema Given '40mg'$  of lasix in ED with good urine output Recent echo in 05/2022 showed EF of >25%, diastolic indeterminate post NSTEMI  Strict I/O Adjust lasix as needed, will continue with '40mg'$  daily for now  Daily weights interrogate PPM  initial troponin 27, delta pending. Denies any chest pain

## 2022-06-09 NOTE — Assessment & Plan Note (Addendum)
Elevated, received hydralazine IV in ED, pain also likely contributing Continue home hydralazine '50mg'$  q 8 hours, imdur '30mg'$  daily, toprol-xl '25mg'$  daily  Symptomatic with blood pressure <790 systolic and did not tolerate titration of her hydralazine outpatient with cardiology

## 2022-06-09 NOTE — H&P (Signed)
History and Physical    Patient: Barbara [name] TZG:[id number] DOB: 12/06/1940 DOA: 06/09/2022 DOS: the patient was seen and examined on 06/09/2022 PCP: Laurey Morale, MD  Patient coming from: Home - lives alone. Uses walker around house   Chief Complaint: left lower quadrant pain   HPI: Barbara [name] is a 81 y.o. female with medical history significant of atrial fibrillation, CHF, GERd, CAD, HTN, HLD, CKD stage III, tach-brady syndrome who presented to ED with complaints of Llq that started around 4AM. She thought she needed to have a BM, but after that she still had the pain. Pain rated as a 10/10 and described as stabbing. Pain constant. No radiation. Nothing made it better or worse. Pain was so bad she called 911. She had some nausea, no vomiting. She has had no fever, but has had chills. She has never had a kidney stone before. She has had some blood in her urine since last year. She denies any dysuria, urgency or frequency.   She also has new oxygen requirement, but denies any shorntess of breath or cough. She has had increased fatigue with walking distances, but denies dyspnea. She has no new leg swelling. She has no orthopnea. PT states she has gained 4 pounds since 7/26, but comparing to her cardiologist weight she hadn't gained any.   Denies vision changes/headaches, chest pain or palpitations, shortness of breath or cough, abdominal pain, N/V/D, dysuria or leg swelling.    She does not smoke or drink alcohol.   ER Course:  vitals: temp: 97.5, bp: 193/85, HR: 63, RR: 18, oxygen: 94%on 3L [place] Pertinent labs: wbc: 11, creatinine: 1.61 (1.5-1.6), troponin 27, bnp: 593 CXR: cardiomegaly with pulmonary edema Ct abdo/pelvis: Left ureteral stones, 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, to 5 mm in size, leading to moderate left hydroureteronephrosis and significant left perinephric stranding/edema. In ED: urology consulted. Given '40mg'$  of lasix, hydralazine, zofran. TRH asked to admit.     Review of Systems: As mentioned in the history of present illness. All other systems reviewed and are negative. Past Medical History:  Diagnosis Date   A-fib [facility])    Allergy    CAD (coronary artery disease)    sees Dr. Shelva Majestic  cardiac stents - 2000   CHF (congestive heart failure) ([place])    Colon polyps    Complication of anesthesia    rash/hives with "caines"   Dyspnea    02/12/18 " when my heart gets out of rhythm, it has not been out of rhythym- since I have been on Tikosyn (11/2017)   Dysrhythmia    afib fib   GERD (gastroesophageal reflux disease)    takes OTC- Omeprazole- prn   Heart murmur    History of stress test    show normal perfusion without scar or ischemia, post EF 68%   Hx of echocardiogram    show an EF 55%-60% range with grade 1 diastolic dysfunction, she had mitral anular calcification with mild MR, moderate LA dilation and mild pulmonary hypertension with a PA estimated pressure of 71m   Hyperlipidemia    Hypertension    NSTEMI (non-ST elevated myocardial infarction) ([place]    Osteoarthritis    Pneumonia    hx of 2015    PONV (postoperative nausea and vomiting)    Past Surgical History:  Procedure Laterality Date   CARDIAC CATHETERIZATION     11/2017   cardiac stents  2000   COLONOSCOPY  01-05-14   per Dr. JTeena Irani clear,  no repeats needed    COLONOSCOPY     CORONARY STENT PLACEMENT  2000   in LAD   DIRECT LARYNGOSCOPY WITH RADIAESSE INJECTION N/A 02/13/2018   Procedure: DIRECT LARYNGOSCOPY WITH RADIAESSE INJECTION;  Surgeon: Melida Quitter, MD;  Location: [place];  Service: ENT;  Laterality: N/A;   EYE SURGERY Left    CATARACT REMOVAL   KNEE ARTHROSCOPY Left 01/06/2015   Procedure: LEFT KNEE ARTHROSCOPY, abrasion chondroplasty of the medial femerol condryl,medial and lateral menisectomy, microfracture , synovectomy of the suprpatellar pouch;  Surgeon: Latanya Maudlin, MD;  Location: [facility];  Service: Orthopedics;  Laterality: Left;   LEFT HEART  CATH AND CORONARY ANGIOGRAPHY N/A 02/26/2019   Procedure: LEFT HEART CATH AND CORONARY ANGIOGRAPHY;  Surgeon: Troy Sine, MD;  Location: [place] [facility];  Service: Cardiovascular;  Laterality: N/A;   LEFT HEART CATH AND CORONARY ANGIOGRAPHY N/A 05/06/2022   Procedure: LEFT HEART CATH AND CORONARY ANGIOGRAPHY;  Surgeon: Nelva Bush, MD;  Location: [place] [facility];  Service: Cardiovascular;  Laterality: N/A;   MICROLARYNGOSCOPY W/VOCAL CORD INJECTION N/A 08/07/2018   Procedure: MICROLARYNGOSCOPY WITH VOCAL CORD INJECTION OF PROLARYN;  Surgeon: Melida Quitter, MD;  Location: [place];  Service: ENT;  Laterality: N/A;  JET VENTILATION   PACEMAKER IMPLANT N/A 05/08/2022   Procedure: PACEMAKER IMPLANT;  Surgeon: Constance Haw, MD;  Location: [place] [facility];  Service: Cardiovascular;  Laterality: N/A;   REVERSE SHOULDER ARTHROPLASTY Right 03/23/2020   Procedure: REVERSE SHOULDER ARTHROPLASTY;  Surgeon: Nicholes Stairs, MD;  Location: [place];  Service: Orthopedics;  Laterality: Right;   VAGINAL HYSTERECTOMY  1971   Social History:  reports that she quit smoking about 25 years ago. Her smoking use included cigarettes. She has never used smokeless tobacco. She reports that she does not drink alcohol and does not use drugs.  Allergies  Allergen Reactions   Lidocaine Anaphylaxis   Morphine Other (See Comments)    "Body shuts down"   Procaine Hcl Anaphylaxis, Rash and Other (See Comments)    "Anything with 'caine' in it "   Sulfonamide Derivatives Hives   Norco [Hydrocodone-Acetaminophen] Other (See Comments)    "Made me sick"   Tramadol Nausea Only   Vytorin [Ezetimibe-Simvastatin] Other (See Comments)    Unknown   Tape Itching and Other (See Comments)    Patient prefers either paper tape or Coban wrap    Family History  Problem Relation Age of Onset   Cancer Maternal Grandmother    Heart disease Maternal Grandfather     Prior to Admission medications   Medication Sig  Start Date End Date Taking? Authorizing Provider  acetaminophen (TYLENOL) 500 MG tablet Take 500 mg by mouth daily as needed for headache.    [provider]  amiodarone (PACERONE) 200 MG tablet Take 1 tablet (200 mg total) by mouth daily. 06/10/22   Almyra Deforest, PA  apixaban (ELIQUIS) 2.5 MG TABS tablet Take 1 tablet (2.5 mg total) by mouth 2 (two) times daily. 02/11/22   Minus Breeding, MD  aspirin 81 MG chewable tablet Chew 1 tablet (81 mg total) by mouth daily. 05/11/22   Thurnell Lose, MD  Cholecalciferol (VITAMIN D3) 2000 units TABS Take 2,000 Units by mouth daily.    [provider]  dextromethorphan (DELSYM) 30 MG/5ML liquid Take 15 mg by mouth daily as needed for cough.    [provider]  docusate sodium (COLACE) 100 MG capsule Take 1 capsule (100 mg total) by mouth  2 (two) times daily. Patient taking differently: Take 100 mg by mouth 2 (two) times daily as needed (constipation). 08/01/21   Laurey Morale, MD  hydrALAZINE (APRESOLINE) 50 MG tablet Take 1 tablet (50 mg total) by mouth every 8 (eight) hours. 05/10/22   Thurnell Lose, MD  ipratropium (ATROVENT) 0.06 % nasal spray Place 2 sprays into both nostrils 2 (two) times daily. 04/28/18   [provider]  isosorbide mononitrate (IMDUR) 30 MG 24 hr tablet Take 1 tablet (30 mg total) by mouth daily. 05/11/22   Thurnell Lose, MD  ketoconazole (NIZORAL) 2 % cream Apply 1 application topically 2 (two) times daily as needed for irritation. 06/13/21   Laurey Morale, MD  ketotifen (ZADITOR) 0.025 % ophthalmic solution Place 1 drop into the right eye every morning.    [provider]  Liniments (BLUE-EMU SUPER STRENGTH) CREA Apply 1 Application topically daily.    [provider]  loratadine (CLARITIN) 10 MG tablet Take 10 mg by mouth daily as needed (allergies/sinus issues).     [provider]  LORazepam (ATIVAN) 0.5 MG tablet TAKE 1 TABLET BY MOUTH EVERY 6 HOURS AS NEEDED FOR  ANXIETY Patient taking differently: Take 0.5 mg by mouth at bedtime. 04/23/22   Laurey Morale, MD  metoprolol succinate (TOPROL-XL) 25 MG 24 hr tablet Take 1 tablet (25 mg total) by mouth daily. 06/05/22   Shirley Friar, PA-C  Multiple Vitamin (MULTIVITAMIN WITH MINERALS) TABS tablet Take 1 tablet by mouth daily.    [provider]  nitroGLYCERIN (NITROSTAT) 0.4 MG SL tablet DISSOLVE ONE TABLET UNDER THE TONGUE EVERY 5 MINUTES AS NEEDED FOR CHEST PAIN.  DO NOT EXCEED A TOTAL OF 3 DOSES IN 15 MINUTES Patient taking differently: Place 0.4 mg under the tongue every 5 (five) minutes as needed for chest pain. 06/21/21   Troy Sine, MD  pantoprazole (PROTONIX) 40 MG tablet Take 1 tablet (40 mg total) by mouth daily. Patient taking differently: Take 40 mg by mouth every morning. 01/03/22   Troy Sine, MD  polyethylene glycol powder (GLYCOLAX/MIRALAX) 17 GM/SCOOP powder Take 17 g by mouth daily. 05/10/22   Thurnell Lose, MD  PRESCRIPTION MEDICATION See admin instructions. CPAP- At bedtime    [provider]  Probiotic Product (PROBIOTIC GUMMIES PO) Take 2 tablets by mouth every morning.    [provider]  rosuvastatin (CRESTOR) 40 MG tablet Take 1 tablet (40 mg total) by mouth daily. Patient taking differently: Take 40 mg by mouth at bedtime. 01/03/22   Troy Sine, MD  triamcinolone (NASACORT) 55 MCG/ACT nasal inhaler Place 1 spray into both nostrils daily as needed (for allergies).  10/17/12   Laurey Morale, MD    Physical Exam: Vitals:   06/09/22 1611 06/09/22 1650 06/09/22 1653 06/09/22 1656  BP: (!) 186/62     Pulse: 60     Resp:      Temp: 97.9 F (36.6 C)     TempSrc: Oral     SpO2: 98% (!) 88% 92% 95%  Weight: 80.7 kg     Height: '5\' 7"'$  (1.702 m)      General:  Appears calm and comfortable and is in NAD Eyes:  PERRL, EOMI, normal lids, iris ENT:  grossly normal hearing, lips & tongue, mmm; appropriate dentition Neck:  no LAD, masses or  thyromegaly; no carotid bruits, no JVD Cardiovascular:  RRR, no m/r/g. 1+ pitting edema to bilateral ankles Respiratory:  CTA bilaterally with no wheezes/rales/rhonchi.  Normal respiratory effort. Abdomen:  soft, TTP in LLQ, ND, NABS Back:   normal alignment, mild left sided CVAT Skin:  no rash or induration seen on limited exam Musculoskeletal:  grossly normal tone BUE/BLE, good ROM, no bony abnormality Lower extremity:  Limited foot exam with no ulcerations.  2+ distal pulses. Psychiatric:  grossly normal mood and affect, speech fluent and appropriate, AOx3 Neurologic:  CN 2-12 grossly intact, moves all extremities in coordinated fashion, sensation intact   Radiological Exams on Admission: Independently reviewed - see discussion in A/P where applicable  CT Abdomen Pelvis W Contrast  Result Date: 06/09/2022 CLINICAL DATA:  Left lower quadrant abdominal pain with frequent bowel movements since early this morning. EXAM: CT ABDOMEN AND PELVIS WITH CONTRAST TECHNIQUE: Multidetector CT imaging of the abdomen and pelvis was performed using the standard protocol following bolus administration of intravenous contrast. RADIATION DOSE REDUCTION: This exam was performed according to the departmental dose-optimization program which includes automated exposure control, adjustment of the mA and/or kV according to patient size and/or use of iterative reconstruction technique. CONTRAST:  53m OMNIPAQUE IOHEXOL 300 MG/ML  SOLN COMPARISON:  Current chest radiograph. FINDINGS: Lower chest: Patchy ground-glass and confluent opacities lung bases associated with small effusions. Hepatobiliary: No focal liver abnormality is seen. No gallstones, gallbladder wall thickening, or biliary dilatation. Pancreas: Unremarkable. No pancreatic ductal dilatation or surrounding inflammatory changes. Spleen: Normal in size without focal abnormality. Adrenals/Urinary Tract: No adrenal masses. Moderate left hydronephrosis with  significant left perinephric stranding/edema. Left ureter is dilated. There is a small stone in the mid ureter, and a second stone or stones in the distal ureter just below the pelvic brim, 5-6 mm in size. No right hydronephrosis. Nonobstructing stones noted in the left kidney, mostly in the lower pole. There are bilateral low-attenuation renal masses, largest on the right, mid to upper pole, 3.3 cm, all consistent with cysts. No follow-up recommended. Normal bladder. Stomach/Bowel: Stomach is within normal limits. Appendix appears normal. No evidence of bowel wall thickening, distention, or inflammatory changes. Vascular/Lymphatic: Aortic atherosclerosis. No aneurysm. No enlarged lymph nodes. Reproductive: Status post hysterectomy. No adnexal masses. Other: None. Musculoskeletal: No fracture or acute finding.  No bone lesion. IMPRESSION: 1. Left ureteral stones, 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, to 5 mm in size, leading to moderate left hydroureteronephrosis and significant left perinephric stranding/edema. 2. No other acute abnormality within the abdomen or pelvis. 3. Nonobstructing left intrarenal stones. 4. Small effusions with lung base opacities, which may reflect pulmonary edema. Consider infection if there are consistent clinical findings. 5. Aortic atherosclerosis. Electronically Signed   By: DLajean ManesM.D.   On: 06/09/2022 13:21   DG Chest Portable 1 View  Result Date: 06/09/2022 CLINICAL DATA:  Hypoxia. EXAM: PORTABLE CHEST 1 VIEW COMPARISON:  05/09/2022 FINDINGS: 1142 hours. Rightward patient rotation. Or cardiomegaly diffuse interstitial and alveolar opacity is new in the interval with a slight parahilar predominance. Present pacemaker again noted. Telemetry leads overlie the chest. IMPRESSION: Cardiomegaly with pulmonary edema. Electronically Signed   By: EMisty StanleyM.D.   On: 06/09/2022 12:03    EKG: Independently reviewed.  NSR with rate 62-atrial paced; nonspecific ST changes with no evidence  of acute ischemia Borderline QT  Labs on Admission: I have personally reviewed the available labs and imaging studies at the time of the admission.  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, 4m in size, leading to moderate left hydroureteronephrosis and significant left perinephric stranding /edema Active Problems:   Acute respiratory failure with hypoxia (HCC)   Acute on chronic diastolic CHF (congestive heart failure) (HCC)   CAD (coronary artery disease/elevated troponin   CKD (chronic kidney disease), stage III (HCC)   PAF (paroxysmal atrial fibrillation) (HCC)   Essential hypertension   Hyperlipidemia   Gastroesophageal reflux disease   Tachycardia-bradycardia syndrome (HCC)   OSA (obstructive sleep apnea)    Assessment and Plan: * Nephrolithiasis, 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 WreJeffie Pollockd will see  -no infectious symptoms, fever, chills and UA does not appear infected. Lactic acid wnl  -continue with IV pain medication  -keep NPO incase of any procedure -hold aSA/eliquis until evaluated by urology   Acute respiratory failure with hypoxia ([place]CC[place]landMS found her to be 88% on room air and required 4L to maintain oxygenation She denies any shortness of breath, cough, orthopnea, has pulmonary edema on CXR  Likely secondary to acute on chronic CHF + possible splinting from pain in abdomen.  Given '40mg'$  lasix in ED with good output, continue to treat CHF IS to  bedside Interrogate ppm  Wean oxygen as tolerated   Acute on chronic diastolic CHF (congestive heart failure) (HCC) New respiratory failure with hypoxia requiring 2-3L oxygen, elevated BNP and findings on CXR of edema Given '40mg'$  of lasix in ED with good urine output Recent echo in 05/2022 showed EF of >75>77%iastolic indeterminate post NSTEMI  Strict I/O Adjust lasix as needed, will continue with '40mg'$  daily for now  Daily weights interrogate PPM  initial troponin 27, delta pending. Denies any chest pain   CKD (chronic kidney disease), stage III (HCC) Stable at her baseline (1.5-1.6) Continue to monitor   CAD (coronary artery disease/elevated troponin 05/03/22: NSTEMI. Cath at that time showed: Mild-moderate, non-obstructive coronary artery disease. And Patent proximal/mid LAD stent with up to 30% in-stent restenosis. Mild, non obstructive disease Initial troponin mildly elevated at 27, delta pending. Has no chest pain. Likely elevated with demand ischemia in setting of acute respiratory failure. F/u on delta. LHC 1 month ago.  Continue medical management with imdur, toprol and crestor  Hold ASA in case needs surgical intervention with stone   PAF (paroxysmal atrial fibrillation) (HCC) NSR, paced Plan on downtitrating amiodarone 200 mg daily on 8/7. Hold Eliquis until seen by urology   Essential hypertension Elevated, received hydralazine IV in ED, pain also likely contributing Continue home hydralazine '50mg'$  q 8 hours, imdur '30mg'$  daily, toprol-xl '25mg'$  daily  Symptomatic with blood pressure <12<824stolic and did not tolerate titration of her hydralazine outpatient with cardiology   Hyperlipidemia Continue crestor daily   Gastroesophageal reflux disease Continue protonix   Tachycardia-bradycardia syndrome ([place]/p PPM in 05/2022  Have asked for PPM interrogation   OSA (obstructive sleep apnea) Uses cpap at night, but declines one in hospital. She can not tolerate the mask.       Advance Care Planning:   Code Status: Full Code   Consults: urology: Dr. WreJeffie PollockDVT Prophylaxis: eliquis (hold until urology sees)/SCD  Family  Communication: daughter on phone: Barkley Boards   Severity of Illness: The appropriate patient status for this patient is INPATIENT. Inpatient status is judged to be reasonable and necessary in order to provide the required intensity of service to ensure the patient's safety. The patient's presenting symptoms, physical exam findings, and initial radiographic and laboratory data in the context of their chronic comorbidities is felt to place them at high risk for further clinical deterioration. Furthermore, it is not anticipated that the patient will be medically stable for discharge from the hospital within 2 midnights of admission.   * I certify that at the point of admission it is my clinical judgment that the patient will require inpatient hospital care spanning beyond 2 midnights from the point of admission due to high intensity of service, high risk for further deterioration and high frequency of surveillance required.*  Author: Orma Flaming, MD 06/09/2022 5:01 PM  For on call review [email or web address].

## 2022-06-09 NOTE — ED Notes (Addendum)
RN placed pt on 5L. O2 88% on 3L. MD notified.

## 2022-06-09 NOTE — Consult Note (Signed)
Subjective:    Consult requested by Dr. Allison Wolfe.  Barbara [name] is an 81 yo female who presented to the ER with severe left lower quadrant pain and nausea without vomiting as well as frequent BM's.  She has had no fever or voiding complaints but has had some ongoing hematuria intermittently for the past year.  She had a CT that showed 2 obstructing left ureteral stones and left renal stones in the lower pole.   Her UA had >50 RBC's but no WBC.  She has a mild leukocytosis.  She has CKD3 and her Cr was stable at 1.6.  ROS:  Review of Systems  Constitutional:  Positive for malaise/fatigue.  Gastrointestinal:  Positive for abdominal pain, nausea and vomiting.  Genitourinary:  Positive for hematuria.  All other systems reviewed and are negative.   Allergies  Allergen Reactions   Lidocaine Anaphylaxis   Morphine Other (See Comments)    "Body shuts down"   Procaine Hcl Anaphylaxis, Rash and Other (See Comments)    "Anything with 'caine' in it "   Sulfonamide Derivatives Hives   Norco [Hydrocodone-Acetaminophen] Other (See Comments)    "Made me sick"   Tramadol Nausea Only   Vytorin [Ezetimibe-Simvastatin] Other (See Comments)    Unknown   Tape Itching and Other (See Comments)    Patient prefers either paper tape or Coban wrap    Past Medical History:  Diagnosis Date   A-fib (HCC)    Allergy    CAD (coronary artery disease)    sees Dr. Thomas Kelly  cardiac stents - 2000   CHF (congestive heart failure) (HCC)    Colon polyps    Complication of anesthesia    rash/hives with "caines"   Dyspnea    02/12/18 " when my heart gets out of rhythm, it has not been out of rhythym- since I have been on Tikosyn (11/2017)   Dysrhythmia    afib fib   GERD (gastroesophageal reflux disease)    takes OTC- Omeprazole- prn   Heart murmur    History of stress test    show normal perfusion without scar or ischemia, post EF 68%   Hx of echocardiogram    show an EF 55%-60% range with grade 1  diastolic dysfunction, she had mitral anular calcification with mild MR, moderate LA dilation and mild pulmonary hypertension with a PA estimated pressure of 39mm   Hyperlipidemia    Hypertension    NSTEMI (non-ST elevated myocardial infarction) (HCC)    Osteoarthritis    Pneumonia    hx of 2015    PONV (postoperative nausea and vomiting)     Past Surgical History:  Procedure Laterality Date   CARDIAC CATHETERIZATION     11/2017   cardiac stents  2000   COLONOSCOPY  01-05-14   per Dr. Solace Manwarren Hayes, clear, no repeats needed    COLONOSCOPY     CORONARY STENT PLACEMENT  2000   in LAD   DIRECT LARYNGOSCOPY WITH RADIAESSE INJECTION N/A 02/13/2018   Procedure: DIRECT LARYNGOSCOPY WITH RADIAESSE INJECTION;  Surgeon: Bates, Dwight, MD;  Location: [facility];  Service: ENT;  Laterality: N/A;   EYE SURGERY Left    CATARACT REMOVAL   KNEE ARTHROSCOPY Left 01/06/2015   Procedure: LEFT KNEE ARTHROSCOPY, abrasion chondroplasty of the medial femerol condryl,medial and lateral menisectomy, microfracture , synovectomy of the suprpatellar pouch;  Surgeon: Ronald Gioffre, MD;  Location: [facility];  Service: Orthopedics;  Laterality: Left;   LEFT HEART CATH AND CORONARY ANGIOGRAPHY N/A   02/26/2019   Procedure: LEFT HEART CATH AND CORONARY ANGIOGRAPHY;  Surgeon: Troy Sine, MD;  Location: [place] [facility];  Service: Cardiovascular;  Laterality: N/A;   LEFT HEART CATH AND CORONARY ANGIOGRAPHY N/A 05/06/2022   Procedure: LEFT HEART CATH AND CORONARY ANGIOGRAPHY;  Surgeon: Nelva Bush, MD;  Location: [place] [facility];  Service: Cardiovascular;  Laterality: N/A;   MICROLARYNGOSCOPY W/VOCAL CORD INJECTION N/A 08/07/2018   Procedure: MICROLARYNGOSCOPY WITH VOCAL CORD INJECTION OF PROLARYN;  Surgeon: Melida Quitter, MD;  Location: [place];  Service: ENT;  Laterality: N/A;  JET VENTILATION   PACEMAKER IMPLANT N/A 05/08/2022   Procedure: PACEMAKER IMPLANT;  Surgeon: Constance Haw, MD;  Location: [place] [facility];   Service: Cardiovascular;  Laterality: N/A;   REVERSE SHOULDER ARTHROPLASTY Right 03/23/2020   Procedure: REVERSE SHOULDER ARTHROPLASTY;  Surgeon: Nicholes Stairs, MD;  Location: [place];  Service: Orthopedics;  Laterality: Right;   VAGINAL HYSTERECTOMY  1971    Social History   Socioeconomic History   Marital status: Married    Spouse name: Not on file   Number of children: 2   Years of education: Not on file   Highest education level: Not on file  Occupational History   Not on file  Tobacco Use   Smoking status: Former    Years: 40.00    Types: Cigarettes    Quit date: 11/04/1996    Years since quitting: 25.6   Smokeless tobacco: Never  Vaping Use   Vaping Use: Never used  Substance and Sexual Activity   Alcohol use: No    Alcohol/week: 0.0 standard drinks of alcohol   Drug use: No   Sexual activity: Not on file  Other Topics Concern   Not on file  Social History Narrative   Not on file   Social Determinants of Health   Financial Resource Strain: Low Risk  (06/06/2021)   Overall Financial Resource Strain (CARDIA)    Difficulty of Paying Living Expenses: Not hard at all  Food Insecurity: No Food Insecurity (06/06/2021)   Hunger Vital Sign    Worried About Running Out of Food in the Last Year: Never true    [place] in the Last Year: Never true  Transportation Needs: No Transportation Needs (06/06/2021)   PRAPARE - Hydrologist (Medical): No    Lack of Transportation (Non-Medical): No  Physical Activity: Inactive (06/06/2021)   Exercise Vital Sign    Days of Exercise per Week: 0 days    Minutes of Exercise per Session: 0 min  Stress: No Stress Concern Present (06/06/2021)   [place]    Feeling of Stress : Only a little  Social Connections: Moderately Integrated (06/06/2021)   Social Connection and Isolation Panel [NHANES]    Frequency of Communication with Friends and  Family: More than three times a week    Frequency of Social Gatherings with Friends and Family: More than three times a week    Attends Religious Services: More than 4 times per year    Active Member of [employer] or Organizations: No    Attends Archivist Meetings: Never    Marital Status: Married  Human resources officer Violence: Not At Risk (06/06/2021)   Humiliation, Afraid, Rape, and Kick questionnaire    Fear of Current or Ex-Partner: No    Emotionally Abused: No    Physically Abused: No    Sexually Abused: No  Family History  Problem Relation Age of Onset   Cancer Maternal Grandmother    Heart disease Maternal Grandfather     Anti-infectives: Anti-infectives (From admission, onward)    None       Current Facility-Administered Medications  Medication Dose Route Frequency Provider Last Rate Last Admin   0.9 %  sodium chloride infusion  250 mL Intravenous PRN Orma Flaming, MD       acetaminophen (TYLENOL) tablet 650 mg  650 mg Oral Q6H PRN Orma Flaming, MD       Or   acetaminophen (TYLENOL) suppository 650 mg  650 mg Rectal Q6H PRN Orma Flaming, MD       [START ON 06/10/2022] furosemide (LASIX) injection 40 mg  40 mg Intravenous Daily Orma Flaming, MD       hydrALAZINE (APRESOLINE) tablet 25 mg  25 mg Oral Q4H PRN Dwyane Dee, MD       HYDROmorphone (DILAUDID) injection 0.5 mg  0.5 mg Intravenous Q3H PRN Orma Flaming, MD   0.5 mg at 06/09/22 1633   labetalol (NORMODYNE) injection 10 mg  10 mg Intravenous Q4H PRN Dwyane Dee, MD   10 mg at 06/09/22 1641   lip balm (CARMEX) ointment   Topical PRN Orma Flaming, MD   Given at 06/09/22 1643   ondansetron (ZOFRAN) tablet 4 mg  4 mg Oral Q6H PRN Orma Flaming, MD       Or   ondansetron [facility]) injection 4 mg  4 mg Intravenous Q6H PRN Orma Flaming, MD       sodium chloride flush (NS) 0.9 % injection 3 mL  3 mL Intravenous Q12H Orma Flaming, MD   3 mL at 06/09/22 1614   sodium chloride flush (NS) 0.9 %  injection 3 mL  3 mL Intravenous PRN Orma Flaming, MD         Objective: Vital signs in last 24 hours: BP (!) 186/62 (BP Location: Right Arm)   Pulse 60   Temp 97.9 F (36.6 C) (Oral)   Resp 18   Ht _0  (1.702 m)   Wt 80.7 kg   SpO2 98%   BMI 27.86 kg/m   Intake/Output from previous day: No intake/output data recorded. Intake/Output this shift: No intake/output data recorded.   Physical Exam Vitals reviewed.  Constitutional:      Appearance: Normal appearance.     Comments: But medicated  Cardiovascular:     Comments: Paced rate of 60. Pulmonary:     Effort: Pulmonary effort is normal.     Breath sounds: Rales (bilateral) present.  Abdominal:     General: Abdomen is flat.     Palpations: Abdomen is soft.     Tenderness: There is abdominal tenderness (LLQ). There is left CVA tenderness.  Musculoskeletal:        General: Normal range of motion.  Skin:    General: Skin is warm and dry.  Neurological:     General: No focal deficit present.     Mental Status: She is alert and oriented to person, place, and time.  Psychiatric:        Mood and Affect: Mood normal.        Behavior: Behavior normal.     Lab Results:  Results for orders placed or performed during the hospital encounter of 06/09/22 (from the past 24 hour(s))  CBC with Differential     Status: Abnormal   Collection Time: 06/09/22 10:49 AM  Result Value Ref Range   WBC 11.0 (H)  4.0 - 10.5 K/uL   RBC 4.36 3.87 - 5.11 MIL/uL   Hemoglobin 12.7 12.0 - 15.0 g/dL   HCT 39.4 36.0 - 46.0 %   MCV 90.4 80.0 - 100.0 fL   MCH 29.1 26.0 - 34.0 pg   MCHC 32.2 30.0 - 36.0 g/dL   RDW 13.4 11.5 - 15.5 %   Platelets 213 150 - 400 K/uL   nRBC 0.0 0.0 - 0.2 %   Neutrophils Relative % 88 %   Neutro Abs 9.7 (H) 1.7 - 7.7 K/uL   Lymphocytes Relative 7 %   Lymphs Abs 0.7 0.7 - 4.0 K/uL   Monocytes Relative 5 %   Monocytes Absolute 0.5 0.1 - 1.0 K/uL   Eosinophils Relative 0 %   Eosinophils Absolute 0.0 0.0 -  0.5 K/uL   Basophils Relative 0 %   Basophils Absolute 0.0 0.0 - 0.1 K/uL   Immature Granulocytes 0 %   Abs Immature Granulocytes 0.03 0.00 - 0.07 K/uL  Comprehensive metabolic panel     Status: Abnormal   Collection Time: 06/09/22 10:49 AM  Result Value Ref Range   Sodium 138 135 - 145 mmol/L   Potassium 3.7 3.5 - 5.1 mmol/L   Chloride 106 98 - 111 mmol/L   CO2 23 22 - 32 mmol/L   Glucose, Bld 145 (H) 70 - 99 mg/dL   BUN 22 8 - 23 mg/dL   Creatinine, Ser 1.61 (H) 0.44 - 1.00 mg/dL   Calcium 9.8 8.9 - 10.3 mg/dL   Total Protein 7.4 6.5 - 8.1 g/dL   Albumin 3.8 3.5 - 5.0 g/dL   AST 24 15 - 41 U/L   ALT 18 0 - 44 U/L   Alkaline Phosphatase 85 38 - 126 U/L   Total Bilirubin 0.7 0.3 - 1.2 mg/dL   GFR, Estimated 32 (L) >60 mL/min   Anion gap 9 5 - 15  Lipase, blood     Status: None   Collection Time: 06/09/22 10:49 AM  Result Value Ref Range   Lipase 32 11 - 51 U/L  Lactic acid, plasma     Status: None   Collection Time: 06/09/22 10:49 AM  Result Value Ref Range   Lactic Acid, Venous 1.3 0.5 - 1.9 mmol/L  Brain natriuretic peptide     Status: Abnormal   Collection Time: 06/09/22 11:03 AM  Result Value Ref Range   B Natriuretic Peptide 593.4 (H) 0.0 - 100.0 pg/mL  Troponin I (High Sensitivity)     Status: Abnormal   Collection Time: 06/09/22 11:03 AM  Result Value Ref Range   Troponin I (High Sensitivity) 27 (H) <18 ng/L  Magnesium     Status: None   Collection Time: 06/09/22 11:03 AM  Result Value Ref Range   Magnesium 2.0 1.7 - 2.4 mg/dL  Troponin I (High Sensitivity)     Status: Abnormal   Collection Time: 06/09/22  2:05 PM  Result Value Ref Range   Troponin I (High Sensitivity) 40 (H) <18 ng/L  Magnesium     Status: None   Collection Time: 06/09/22  2:05 PM  Result Value Ref Range   Magnesium 1.9 1.7 - 2.4 mg/dL  Urinalysis, Routine w reflex microscopic Urine, Clean Catch     Status: Abnormal   Collection Time: 06/09/22  2:26 PM  Result Value Ref Range   Color,  Urine YELLOW YELLOW   APPearance CLOUDY (A) CLEAR   Specific Gravity, Urine 1.010 1.005 - 1.030   pH 6.5 5.0 -  8.0   Glucose, UA NEGATIVE NEGATIVE mg/dL   Hgb urine dipstick LARGE (A) NEGATIVE   Bilirubin Urine NEGATIVE NEGATIVE   Ketones, ur NEGATIVE NEGATIVE mg/dL   Protein, ur NEGATIVE NEGATIVE mg/dL   Nitrite NEGATIVE NEGATIVE   Leukocytes,Ua TRACE (A) NEGATIVE  Urinalysis, Microscopic (reflex)     Status: None   Collection Time: 06/09/22  2:26 PM  Result Value Ref Range   RBC / HPF >50 0 - 5 RBC/hpf   WBC, UA 0-5 0 - 5 WBC/hpf   Bacteria, UA NONE SEEN NONE SEEN   Squamous Epithelial / LPF 0-5 0 - 5   Mucus PRESENT     BMET Recent Labs    06/09/22 1049  NA 138  K 3.7  CL 106  CO2 23  GLUCOSE 145*  BUN 22  CREATININE 1.61*  CALCIUM 9.8   PT/INR No results for input(s): "LABPROT", "INR" in the last 72 hours. ABG No results for input(s): "PHART", "HCO3" in the last 72 hours.  Invalid input(s): "PCO2", "PO2"  Studies/Results: CT Abdomen Pelvis W Contrast  Result Date: 06/09/2022 CLINICAL DATA:  Left lower quadrant abdominal pain with frequent bowel movements since early this morning. EXAM: CT ABDOMEN AND PELVIS WITH CONTRAST TECHNIQUE: Multidetector CT imaging of the abdomen and pelvis was performed using the standard protocol following bolus administration of intravenous contrast. RADIATION DOSE REDUCTION: This exam was performed according to the departmental dose-optimization program which includes automated exposure control, adjustment of the mA and/or kV according to patient size and/or use of iterative reconstruction technique. CONTRAST:  29m OMNIPAQUE IOHEXOL 300 MG/ML  SOLN COMPARISON:  Current chest radiograph. FINDINGS: Lower chest: Patchy ground-glass and confluent opacities lung bases associated with small effusions. Hepatobiliary: No focal liver abnormality is seen. No gallstones, gallbladder wall thickening, or biliary dilatation. Pancreas: Unremarkable. No  pancreatic ductal dilatation or surrounding inflammatory changes. Spleen: Normal in size without focal abnormality. Adrenals/Urinary Tract: No adrenal masses. Moderate left hydronephrosis with significant left perinephric stranding/edema. Left ureter is dilated. There is a small stone in the mid ureter, and a second stone or stones in the distal ureter just below the pelvic brim, 5-6 mm in size. No right hydronephrosis. Nonobstructing stones noted in the left kidney, mostly in the lower pole. There are bilateral low-attenuation renal masses, largest on the right, mid to upper pole, 3.3 cm, all consistent with cysts. No follow-up recommended. Normal bladder. Stomach/Bowel: Stomach is within normal limits. Appendix appears normal. No evidence of bowel wall thickening, distention, or inflammatory changes. Vascular/Lymphatic: Aortic atherosclerosis. No aneurysm. No enlarged lymph nodes. Reproductive: Status post hysterectomy. No adnexal masses. Other: None. Musculoskeletal: No fracture or acute finding.  No bone lesion. IMPRESSION: 1. Left ureteral stones, 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, to 5 mm in size, leading to moderate left hydroureteronephrosis and significant left perinephric stranding/edema. 2. No other acute abnormality within the abdomen or pelvis. 3. Nonobstructing left intrarenal stones. 4. Small effusions with lung base opacities, which may reflect pulmonary edema. Consider infection if there are consistent clinical findings. 5. Aortic atherosclerosis. Electronically Signed   By: DLajean ManesM.D.   On: 06/09/2022 13:21   DG Chest Portable 1 View  Result Date: 06/09/2022 CLINICAL DATA:  Hypoxia. EXAM: PORTABLE CHEST 1 VIEW COMPARISON:  05/09/2022 FINDINGS: 1142 hours. Rightward patient rotation. Or cardiomegaly diffuse interstitial and alveolar opacity is new in the interval with a slight parahilar predominance. Present pacemaker again noted. Telemetry leads overlie the chest. IMPRESSION: Cardiomegaly with  pulmonary edema. Electronically Signed  By: Misty Stanley M.D.   On: 06/09/2022 12:03     Assessment/Plan: Left ureteral stone with pain and nausea.   She has persistent pain and nausea with vomiting with 2 left ureteral stones with the largest about 78m in the mid ureter and she also has some left renal stones.  She has hydronephrosis with perinephric stranding but the urine only has hematuria and she is afebrile with a minimal leukocytosis.  She has stable CKD.  She would benefit symptomatically from cystoscopy with left ureteral stenting with delayed ureteroscopy but it is not emergent.  I have reviewed the risks of bleeding, infection, ureteral injury, need for secondary procedures, thrombotic events and anesthetic complications.    CAD with CHF with bilateral pleural effusion and hypoxia on RA.   It would be best to optimize her cardiac situation prior to surgery, but a stent couple be placed under MAC if necessary.          No follow-ups on file.    CC: Dr. AOrma Flaming      JIrine Seal8/04/2022 [phone]

## 2022-06-09 NOTE — Assessment & Plan Note (Addendum)
S/p PPM in 05/2022  Have asked for PPM interrogation

## 2022-06-09 NOTE — ED Notes (Signed)
RN called RT

## 2022-06-09 NOTE — Assessment & Plan Note (Signed)
EMS found her to be 88% on room air and required 4L to maintain oxygenation She denies any shortness of breath, cough, orthopnea, has pulmonary edema on CXR  Likely secondary to acute on chronic CHF + possible splinting from pain in abdomen.  Given '40mg'$  lasix in ED with good output, continue to treat CHF IS to bedside Interrogate ppm  Wean oxygen as tolerated

## 2022-06-09 NOTE — ED Notes (Signed)
Patient transported to CT 

## 2022-06-09 NOTE — ED Triage Notes (Addendum)
Patient complains of left lower abdominal pain with frequent bowel movements since 0400 with nausea. Reports that the pain is severe. Recent MI with pacemaker placed in July. Arrived with oxygen sat at 88 RA, no previous oxygen dependence. Alert and oriented Patient guarding abdomen and appears restless and uncomfortable

## 2022-06-09 NOTE — ED Provider Notes (Signed)
[facility] EMERGENCY DEPARTMENT Provider Note   CSN: [id number] Arrival date & time: 06/09/22  8416     History PMH: HTN, CAD (recent NSTEMI June 2023), Atrial fibrillation, CHF, GERD, HLD - Heart Cath (June 2023 revealed nonobstructing CAD, on medical therapy, no stent placement)  No chief complaint on file.   Barbara [name] is a 81 y.o. female. Presents with left lower quadrant abdominal pain that started at 4 AM this morning and has gradually worsened.  Pain has not moved at all and is only in that location.  She does endorse having difficulty with bowel movements today, but they are normal in consistency without diarrhea.  She has associated nausea without vomiting.  She also endorses some chills and diaphoresis while she was trying have a bowel movement earlier today. He had a recent NSTEMI last June and has been compliant with medications.  She denies any chest pain or shortness of breath today.  She has no history of oxygen use.  No history of diverticulitis or intra-abdominal procedures.  HPI     Home Medications Prior to Admission medications   Medication Sig Start Date End Date Taking? Authorizing Provider  acetaminophen (TYLENOL) 500 MG tablet Take 500 mg by mouth daily as needed for headache.    [provider]  amiodarone (PACERONE) 200 MG tablet Take 1 tablet (200 mg total) by mouth daily. 06/10/22   Almyra Deforest, PA  apixaban (ELIQUIS) 2.5 MG TABS tablet Take 1 tablet (2.5 mg total) by mouth 2 (two) times daily. 02/11/22   Minus Breeding, MD  aspirin 81 MG chewable tablet Chew 1 tablet (81 mg total) by mouth daily. 05/11/22   Thurnell Lose, MD  Cholecalciferol (VITAMIN D3) 2000 units TABS Take 2,000 Units by mouth daily.    [provider]  dextromethorphan (DELSYM) 30 MG/5ML liquid Take 15 mg by mouth daily as needed for cough.    [provider]  docusate sodium (COLACE) 100 MG capsule Take 1 capsule (100 mg total) by  mouth 2 (two) times daily. Patient taking differently: Take 100 mg by mouth 2 (two) times daily as needed (constipation). 08/01/21   Laurey Morale, MD  hydrALAZINE (APRESOLINE) 50 MG tablet Take 1 tablet (50 mg total) by mouth every 8 (eight) hours. 05/10/22   Thurnell Lose, MD  ipratropium (ATROVENT) 0.06 % nasal spray Place 2 sprays into both nostrils 2 (two) times daily. 04/28/18   [provider]  isosorbide mononitrate (IMDUR) 30 MG 24 hr tablet Take 1 tablet (30 mg total) by mouth daily. 05/11/22   Thurnell Lose, MD  ketoconazole (NIZORAL) 2 % cream Apply 1 application topically 2 (two) times daily as needed for irritation. 06/13/21   Laurey Morale, MD  ketotifen (ZADITOR) 0.025 % ophthalmic solution Place 1 drop into the right eye every morning.    [provider]  Liniments (BLUE-EMU SUPER STRENGTH) CREA Apply 1 Application topically daily.    [provider]  loratadine (CLARITIN) 10 MG tablet Take 10 mg by mouth daily as needed (allergies/sinus issues).     [provider]  LORazepam (ATIVAN) 0.5 MG tablet TAKE 1 TABLET BY MOUTH EVERY 6 HOURS AS NEEDED FOR ANXIETY Patient taking differently: Take 0.5 mg by mouth at bedtime. 04/23/22   Laurey Morale, MD  metoprolol succinate (TOPROL-XL) 25 MG 24 hr tablet Take 1 tablet (25 mg total) by mouth daily. 06/05/22   Shirley Friar, PA-C  Multiple Vitamin (MULTIVITAMIN  WITH MINERALS) TABS tablet Take 1 tablet by mouth daily.    [provider]  nitroGLYCERIN (NITROSTAT) 0.4 MG SL tablet DISSOLVE ONE TABLET UNDER THE TONGUE EVERY 5 MINUTES AS NEEDED FOR CHEST PAIN.  DO NOT EXCEED A TOTAL OF 3 DOSES IN 15 MINUTES Patient taking differently: Place 0.4 mg under the tongue every 5 (five) minutes as needed for chest pain. 06/21/21   Troy Sine, MD  pantoprazole (PROTONIX) 40 MG tablet Take 1 tablet (40 mg total) by mouth daily. Patient taking differently: Take 40 mg by mouth every morning. 01/03/22    Troy Sine, MD  polyethylene glycol powder (GLYCOLAX/MIRALAX) 17 GM/SCOOP powder Take 17 g by mouth daily. 05/10/22   Thurnell Lose, MD  PRESCRIPTION MEDICATION See admin instructions. CPAP- At bedtime    [provider]  Probiotic Product (PROBIOTIC GUMMIES PO) Take 2 tablets by mouth every morning.    [provider]  rosuvastatin (CRESTOR) 40 MG tablet Take 1 tablet (40 mg total) by mouth daily. Patient taking differently: Take 40 mg by mouth at bedtime. 01/03/22   Troy Sine, MD  triamcinolone (NASACORT) 55 MCG/ACT nasal inhaler Place 1 spray into both nostrils daily as needed (for allergies).  10/17/12   Laurey Morale, MD      Allergies    Lidocaine, Morphine, Procaine hcl, Sulfonamide derivatives, Norco [hydrocodone-acetaminophen], Tramadol, Vytorin [ezetimibe-simvastatin], and Tape    Review of Systems   Review of Systems  Constitutional:  Positive for chills and diaphoresis. Negative for fever.  Respiratory:  Negative for cough and shortness of breath.   Cardiovascular:  Negative for chest pain and leg swelling.  Gastrointestinal:  Positive for abdominal pain and constipation. Negative for abdominal distention, blood in stool, diarrhea, nausea and vomiting.  Genitourinary:  Negative for dysuria, flank pain and hematuria.  Skin:  Negative for wound.  Psychiatric/Behavioral:  Negative for confusion.   All other systems reviewed and are negative.   Physical Exam Updated Vital Signs BP (!) 190/64   Pulse 60   Temp 97.7 F (36.5 C) (Oral)   Resp 13   SpO2 95%  Physical Exam Vitals and nursing note reviewed.  Constitutional:      General: She is not in acute distress.    Appearance: Normal appearance. She is not ill-appearing, toxic-appearing or diaphoretic.  HENT:     Head: Normocephalic and atraumatic.     Nose: No nasal deformity.     Mouth/Throat:     Lips: Pink. No lesions.     Mouth: Mucous membranes are moist. No injury, lacerations,  oral lesions or angioedema.     Pharynx: Oropharynx is clear. Uvula midline. No pharyngeal swelling, oropharyngeal exudate, posterior oropharyngeal erythema or uvula swelling.  Eyes:     General: Gaze aligned appropriately. No scleral icterus.       Right eye: No discharge.        Left eye: No discharge.     Conjunctiva/sclera: Conjunctivae normal.     Right eye: Right conjunctiva is not injected. No exudate or hemorrhage.    Left eye: Left conjunctiva is not injected. No exudate or hemorrhage. Cardiovascular:     Rate and Rhythm: Normal rate and regular rhythm.     Pulses: Normal pulses.          Radial pulses are 2+ on the right side and 2+ on the left side.       Dorsalis pedis pulses are 2+ on the right side and 2+  on the left side.     Heart sounds: Normal heart sounds, S1 normal and S2 normal. Heart sounds not distant. No murmur heard.    No friction rub. No gallop. No S3 or S4 sounds.  Pulmonary:     Effort: Pulmonary effort is normal. No accessory muscle usage or respiratory distress.     Breath sounds: Normal breath sounds. No stridor. No wheezing, rhonchi or rales.  Chest:     Chest wall: No tenderness.  Abdominal:     General: Abdomen is flat. There is no distension.     Palpations: Abdomen is soft. There is no mass or pulsatile mass.     Tenderness: There is abdominal tenderness. There is no right CVA tenderness, left CVA tenderness, guarding or rebound.     Hernia: No hernia is present.     Comments: Moderate tenderness to the left lower quadrant. Only focal to this area. No evidence of hernia.   Musculoskeletal:     Right lower leg: No edema.     Left lower leg: No edema.  Skin:    General: Skin is warm and dry.     Coloration: Skin is not jaundiced or pale.     Findings: No bruising, erythema, lesion or rash.  Neurological:     General: No focal deficit present.     Mental Status: She is alert and oriented to person, place, and time.     GCS: GCS eye subscore is 4.  GCS verbal subscore is 5. GCS motor subscore is 6.  Psychiatric:        Mood and Affect: Mood normal.        Behavior: Behavior normal. Behavior is cooperative.     ED Results / Procedures / Treatments   Labs (all labs ordered are listed, but only abnormal results are displayed) Labs Reviewed  CBC WITH DIFFERENTIAL/PLATELET - Abnormal; Notable for the following components:      Result Value   WBC 11.0 (*)    Neutro Abs 9.7 (*)    All other components within normal limits  COMPREHENSIVE METABOLIC PANEL - Abnormal; Notable for the following components:   Glucose, Bld 145 (*)    Creatinine, Ser 1.61 (*)    GFR, Estimated 32 (*)    All other components within normal limits  URINE CULTURE  LIPASE, BLOOD  LACTIC ACID, PLASMA  URINALYSIS, ROUTINE W REFLEX MICROSCOPIC  BRAIN NATRIURETIC PEPTIDE  MAGNESIUM  MAGNESIUM  TROPONIN I (HIGH SENSITIVITY)  TROPONIN I (HIGH SENSITIVITY)    EKG EKG Interpretation  Date/Time:  Sunday June 09 2022 10:04:53 EDT Ventricular Rate:  62 PR Interval:    QRS Duration: 94 QT Interval:  484 QTC Calculation: 492 R Axis:   78 Text Interpretation: ATRIAL PACED RHYTHM Probable LVH with secondary repol abnrm Borderline prolonged QT interval Confirmed by Regan Lemming ([id number]) on 06/09/2022 10:40:28 AM  Radiology CT Abdomen Pelvis W Contrast  Result Date: 06/09/2022 CLINICAL DATA:  Left lower quadrant abdominal pain with frequent bowel movements since early this morning. EXAM: CT ABDOMEN AND PELVIS WITH CONTRAST TECHNIQUE: Multidetector CT imaging of the abdomen and pelvis was performed using the standard protocol following bolus administration of intravenous contrast. RADIATION DOSE REDUCTION: This exam was performed according to the departmental dose-optimization program which includes automated exposure control, adjustment of the mA and/or kV according to patient size and/or use of iterative reconstruction technique. CONTRAST:  10m OMNIPAQUE IOHEXOL 300  MG/ML  SOLN COMPARISON:  Current chest radiograph. FINDINGS: Lower chest:  Patchy ground-glass and confluent opacities lung bases associated with small effusions. Hepatobiliary: No focal liver abnormality is seen. No gallstones, gallbladder wall thickening, or biliary dilatation. Pancreas: Unremarkable. No pancreatic ductal dilatation or surrounding inflammatory changes. Spleen: Normal in size without focal abnormality. Adrenals/Urinary Tract: No adrenal masses. Moderate left hydronephrosis with significant left perinephric stranding/edema. Left ureter is dilated. There is a small stone in the mid ureter, and a second stone or stones in the distal ureter just below the pelvic brim, 5-6 mm in size. No right hydronephrosis. Nonobstructing stones noted in the left kidney, mostly in the lower pole. There are bilateral low-attenuation renal masses, largest on the right, mid to upper pole, 3.3 cm, all consistent with cysts. No follow-up recommended. Normal bladder. Stomach/Bowel: Stomach is within normal limits. Appendix appears normal. No evidence of bowel wall thickening, distention, or inflammatory changes. Vascular/Lymphatic: Aortic atherosclerosis. No aneurysm. No enlarged lymph nodes. Reproductive: Status post hysterectomy. No adnexal masses. Other: None. Musculoskeletal: No fracture or acute finding.  No bone lesion. IMPRESSION: 1. Left ureteral stones, 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, to 5 mm in size, leading to moderate left hydroureteronephrosis and significant left perinephric stranding/edema. 2. No other acute abnormality within the abdomen or pelvis. 3. Nonobstructing left intrarenal stones. 4. Small effusions with lung base opacities, which may reflect pulmonary edema. Consider infection if there are consistent clinical findings. 5. Aortic atherosclerosis. Electronically Signed   By: Lajean Manes M.D.   On: 06/09/2022 13:21   DG Chest Portable 1 View  Result Date: 06/09/2022 CLINICAL DATA:  Hypoxia. EXAM:  PORTABLE CHEST 1 VIEW COMPARISON:  05/09/2022 FINDINGS: 1142 hours. Rightward patient rotation. Or cardiomegaly diffuse interstitial and alveolar opacity is new in the interval with a slight parahilar predominance. Present pacemaker again noted. Telemetry leads overlie the chest. IMPRESSION: Cardiomegaly with pulmonary edema. Electronically Signed   By: Misty Stanley M.D.   On: 06/09/2022 12:03    Procedures .Critical Care  Performed by: Adolphus Birchwood, PA-C Authorized by: Adolphus Birchwood, PA-C   Critical care provider statement:    Critical care time (minutes):  45   Critical care time was exclusive of:  Separately billable procedures and treating other patients   Critical care was necessary to treat or prevent imminent or life-threatening deterioration of the following conditions:  Respiratory failure   Critical care was time spent personally by me on the following activities:  Blood draw for specimens, ordering and performing treatments and interventions, development of treatment plan with patient or surrogate, discussions with consultants, evaluation of patient's response to treatment, discussions with primary provider, examination of patient, obtaining history from patient or surrogate, review of old charts, pulse oximetry, re-evaluation of patient's condition, ordering and review of radiographic studies and ordering and review of laboratory studies   Care discussed with: admitting provider     This patient was on telemetry or cardiac monitoring during their time in the ED.    Medications Ordered in ED Medications  HYDROmorphone (DILAUDID) injection 0.5 mg (0.5 mg Intravenous Given 06/09/22 1155)  ondansetron (ZOFRAN) injection 4 mg (4 mg Intravenous Given 06/09/22 1153)  furosemide (LASIX) injection 40 mg (40 mg Intravenous Given 06/09/22 1215)  hydrALAZINE (APRESOLINE) injection 10 mg (10 mg Intravenous Given 06/09/22 1214)  iohexol (OMNIPAQUE) 300 MG/ML solution 75 mL (75 mLs Intravenous  Contrast Given 06/09/22 1246)  fentaNYL (SUBLIMAZE) injection 50 mcg (50 mcg Intravenous Given 06/09/22 1412)    ED Course/ Medical Decision Making/ A&P Clinical Course as of 06/09/22 1426  Sun Jun 09, 2022  1208 CXR with cardiomegaly and pulmonary edema. This would explain the hypoxia. She is now on 5 L. Lasix ordered. Treating BP with Hydralazine. Added on  BNP and troponins [GL]  1327 CT Abdomen Pelvis W Contrast 1. Left ureteral stones, 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, to 5 mm in size, leading to moderate left hydroureteronephrosis and significant left perinephric stranding/edema. 2. No other acute abnormality within the abdomen or pelvis. 3. Nonobstructing left intrarenal stones. 4. Small effusions with lung base opacities, which may reflect pulmonary edema. Consider infection if there are consistent clinical findings. 5. Aortic atherosclerosis.   [GL]    Clinical Course User Index [GL] Sherre Poot Adora Fridge, PA-C                           Medical Decision Making Amount and/or Complexity of Data Reviewed Labs: ordered. Radiology: ordered. Decision-making details documented in ED Course.  Risk Prescription drug management. Decision regarding hospitalization.    MDM  This is a 81 y.o. female who presents to the ED with LLQ abdominal pain that started suddenly and has gradually worsened for 6 hours The differential of this patient includes but is not limited to hernia, Diverticulitis, Ovarian Torsion, UTI, Constipation, etc.  Initial Impression  Chronically ill appearing.  Vitals stable. BP 193/85 likely due to pain. No chest pain, headache, confusion or signs of end organ damage. Also hasn't taken medications today for BP. Will reassess after pain control No signs of this being a cardiac etiology despite recent NSTEMI. An EKG was obtained, but don't think cardiac markers are indicated.  She is incidentally found to be hypoxic to 88%. She was placed on 3 L of O2. Lungs are clear. She  is not short of breath. No respiratory symptoms noted on history. Will obtain chest xray.   I personally ordered, reviewed, and interpreted all laboratory work and imaging and agree with radiologist interpretation. Results interpreted below: Labs reveal leukocytosis to 11.  Creatinine 1.61 which is stable from baseline with GFR 32.  Lipase normal.  Lactate normal. Chest x-ray reveals pulmonary edema and cardiomegaly CT abdomen pelvis actually reveals a left ureteral stone in the 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al that is about 5 mm in size.  She is associated moderate left hydro as well as significant left perinephric stranding or edema UA is pending  Assessment/Plan:  Patient's chest x-ray revealed pulmonary edema and cardiomegaly.  She was unable to be weaned off from oxygen and is actually now on 5 L and in no acute respiratory distress.  She has a pending BNP as well as troponins.  She has been given 40 mg of Lasix and have treated her blood pressure with 10 mg of hydralazine.  She also could be hypoxic from splinting from pain. Recent ECHO was done in July with normal EF.   Pain is slightly improved with IV Dilaudid.  I have discussed this case with urologist, Dr. Jeffie Pollock regarding her left ureteral stone.  He is planning to see patient and consult on her.  However given new pulmonary edema and hypoxia, patient will need to be admitted to hospitalist for medical management.  I discussed case with Dr. Yves Dill with [facility]. Plans to evaluate patient for admission.   Charting Requirements Additional history is obtained from:  Independent historian External Records from outside source obtained and reviewed including: recent hospitalization, recent ECHO, recent cath Social Determinants of Health:  Death of a family  member Pertinant PMH that complicates patient's illness: hx CHF  Patient Care Problems that were addressed during this visit: - Left Ureteral Stone: Acute illness with complication - Acute  Respiratory Failure with Hypoxia: Acute illness with systemic symptoms - Acute Pulmonary Edema: Acute illness This patient was maintained on a cardiac monitor/telemetry. I personally viewed and interpreted the cardiac monitor which reveals an underlying rhythm of NSR Medications given in ED: Dilaudid, zofran, hydralazine, lasix, fentanyl Reevaluation of the patient after these medicines showed that the patient stayed the same I have reviewed home medications and made changes accordingly.  Critical Care Interventions: Gave oxygen Consultations: Urology, [facility] Disposition: admit  This is a supervised visit with my attending physician, Dr. Armandina Gemma. We have discussed this patient and they have altered the plan as needed.  Portions of this note were generated with [employer]. Dictation errors may occur despite best attempts at proofreading.     Final Clinical Impression(s) / ED Diagnoses Final diagnoses:  Left ureteral stone  Acute respiratory failure with hypoxia ([place])  Acute pulmonary edema [facility])    Rx / DC Orders ED Discharge Orders     None         Adolphus Birchwood, PA-C 06/09/22 1426    Regan Lemming, MD 06/09/22 1552

## 2022-06-09 NOTE — H&P (View-Only) (Signed)
Subjective:    Consult requested by Dr. Allison Wolfe.  Barbara [name] is an 81 yo female who presented to the ER with severe left lower quadrant pain and nausea without vomiting as well as frequent BM's.  She has had no fever or voiding complaints but has had some ongoing hematuria intermittently for the past year.  She had a CT that showed 2 obstructing left ureteral stones and left renal stones in the lower pole.   Her UA had >50 RBC's but no WBC.  She has a mild leukocytosis.  She has CKD3 and her Cr was stable at 1.6.  ROS:  Review of Systems  Constitutional:  Positive for malaise/fatigue.  Gastrointestinal:  Positive for abdominal pain, nausea and vomiting.  Genitourinary:  Positive for hematuria.  All other systems reviewed and are negative.   Allergies  Allergen Reactions   Lidocaine Anaphylaxis   Morphine Other (See Comments)    "Body shuts down"   Procaine Hcl Anaphylaxis, Rash and Other (See Comments)    "Anything with 'caine' in it "   Sulfonamide Derivatives Hives   Norco [Hydrocodone-Acetaminophen] Other (See Comments)    "Made me sick"   Tramadol Nausea Only   Vytorin [Ezetimibe-Simvastatin] Other (See Comments)    Unknown   Tape Itching and Other (See Comments)    Patient prefers either paper tape or Coban wrap    Past Medical History:  Diagnosis Date   A-fib (HCC)    Allergy    CAD (coronary artery disease)    sees Dr. Thomas Kelly  cardiac stents - 2000   CHF (congestive heart failure) (HCC)    Colon polyps    Complication of anesthesia    rash/hives with "caines"   Dyspnea    02/12/18 " when my heart gets out of rhythm, it has not been out of rhythym- since I have been on Tikosyn (11/2017)   Dysrhythmia    afib fib   GERD (gastroesophageal reflux disease)    takes OTC- Omeprazole- prn   Heart murmur    History of stress test    show normal perfusion without scar or ischemia, post EF 68%   Hx of echocardiogram    show an EF 55%-60% range with grade 1  diastolic dysfunction, she had mitral anular calcification with mild MR, moderate LA dilation and mild pulmonary hypertension with a PA estimated pressure of 39mm   Hyperlipidemia    Hypertension    NSTEMI (non-ST elevated myocardial infarction) (HCC)    Osteoarthritis    Pneumonia    hx of 2015    PONV (postoperative nausea and vomiting)     Past Surgical History:  Procedure Laterality Date   CARDIAC CATHETERIZATION     11/2017   cardiac stents  2000   COLONOSCOPY  01-05-14   per Dr. Pleasant Britz Hayes, clear, no repeats needed    COLONOSCOPY     CORONARY STENT PLACEMENT  2000   in LAD   DIRECT LARYNGOSCOPY WITH RADIAESSE INJECTION N/A 02/13/2018   Procedure: DIRECT LARYNGOSCOPY WITH RADIAESSE INJECTION;  Surgeon: Bates, Dwight, MD;  Location: [facility];  Service: ENT;  Laterality: N/A;   EYE SURGERY Left    CATARACT REMOVAL   KNEE ARTHROSCOPY Left 01/06/2015   Procedure: LEFT KNEE ARTHROSCOPY, abrasion chondroplasty of the medial femerol condryl,medial and lateral menisectomy, microfracture , synovectomy of the suprpatellar pouch;  Surgeon: Ronald Gioffre, MD;  Location: [facility];  Service: Orthopedics;  Laterality: Left;   LEFT HEART CATH AND CORONARY ANGIOGRAPHY N/A   02/26/2019   Procedure: LEFT HEART CATH AND CORONARY ANGIOGRAPHY;  Surgeon: Troy Sine, MD;  Location: [place] [facility];  Service: Cardiovascular;  Laterality: N/A;   LEFT HEART CATH AND CORONARY ANGIOGRAPHY N/A 05/06/2022   Procedure: LEFT HEART CATH AND CORONARY ANGIOGRAPHY;  Surgeon: Nelva Bush, MD;  Location: [place] [facility];  Service: Cardiovascular;  Laterality: N/A;   MICROLARYNGOSCOPY W/VOCAL CORD INJECTION N/A 08/07/2018   Procedure: MICROLARYNGOSCOPY WITH VOCAL CORD INJECTION OF PROLARYN;  Surgeon: Melida Quitter, MD;  Location: [place];  Service: ENT;  Laterality: N/A;  JET VENTILATION   PACEMAKER IMPLANT N/A 05/08/2022   Procedure: PACEMAKER IMPLANT;  Surgeon: Constance Haw, MD;  Location: [place] [facility];   Service: Cardiovascular;  Laterality: N/A;   REVERSE SHOULDER ARTHROPLASTY Right 03/23/2020   Procedure: REVERSE SHOULDER ARTHROPLASTY;  Surgeon: Nicholes Stairs, MD;  Location: [place];  Service: Orthopedics;  Laterality: Right;   VAGINAL HYSTERECTOMY  1971    Social History   Socioeconomic History   Marital status: Married    Spouse name: Not on file   Number of children: 2   Years of education: Not on file   Highest education level: Not on file  Occupational History   Not on file  Tobacco Use   Smoking status: Former    Years: 40.00    Types: Cigarettes    Quit date: 11/04/1996    Years since quitting: 25.6   Smokeless tobacco: Never  Vaping Use   Vaping Use: Never used  Substance and Sexual Activity   Alcohol use: No    Alcohol/week: 0.0 standard drinks of alcohol   Drug use: No   Sexual activity: Not on file  Other Topics Concern   Not on file  Social History Narrative   Not on file   Social Determinants of Health   Financial Resource Strain: Low Risk  (06/06/2021)   Overall Financial Resource Strain (CARDIA)    Difficulty of Paying Living Expenses: Not hard at all  Food Insecurity: No Food Insecurity (06/06/2021)   Hunger Vital Sign    Worried About Running Out of Food in the Last Year: Never true    [place] in the Last Year: Never true  Transportation Needs: No Transportation Needs (06/06/2021)   PRAPARE - Hydrologist (Medical): No    Lack of Transportation (Non-Medical): No  Physical Activity: Inactive (06/06/2021)   Exercise Vital Sign    Days of Exercise per Week: 0 days    Minutes of Exercise per Session: 0 min  Stress: No Stress Concern Present (06/06/2021)   [place]    Feeling of Stress : Only a little  Social Connections: Moderately Integrated (06/06/2021)   Social Connection and Isolation Panel [NHANES]    Frequency of Communication with Friends and  Family: More than three times a week    Frequency of Social Gatherings with Friends and Family: More than three times a week    Attends Religious Services: More than 4 times per year    Active Member of [employer] or Organizations: No    Attends Archivist Meetings: Never    Marital Status: Married  Human resources officer Violence: Not At Risk (06/06/2021)   Humiliation, Afraid, Rape, and Kick questionnaire    Fear of Current or Ex-Partner: No    Emotionally Abused: No    Physically Abused: No    Sexually Abused: No  Family History  Problem Relation Age of Onset   Cancer Maternal Grandmother    Heart disease Maternal Grandfather     Anti-infectives: Anti-infectives (From admission, onward)    None       Current Facility-Administered Medications  Medication Dose Route Frequency Provider Last Rate Last Admin   0.9 %  sodium chloride infusion  250 mL Intravenous PRN Orma Flaming, MD       acetaminophen (TYLENOL) tablet 650 mg  650 mg Oral Q6H PRN Orma Flaming, MD       Or   acetaminophen (TYLENOL) suppository 650 mg  650 mg Rectal Q6H PRN Orma Flaming, MD       [START ON 06/10/2022] furosemide (LASIX) injection 40 mg  40 mg Intravenous Daily Orma Flaming, MD       hydrALAZINE (APRESOLINE) tablet 25 mg  25 mg Oral Q4H PRN Dwyane Dee, MD       HYDROmorphone (DILAUDID) injection 0.5 mg  0.5 mg Intravenous Q3H PRN Orma Flaming, MD   0.5 mg at 06/09/22 1633   labetalol (NORMODYNE) injection 10 mg  10 mg Intravenous Q4H PRN Dwyane Dee, MD   10 mg at 06/09/22 1641   lip balm (CARMEX) ointment   Topical PRN Orma Flaming, MD   Given at 06/09/22 1643   ondansetron (ZOFRAN) tablet 4 mg  4 mg Oral Q6H PRN Orma Flaming, MD       Or   ondansetron [facility]) injection 4 mg  4 mg Intravenous Q6H PRN Orma Flaming, MD       sodium chloride flush (NS) 0.9 % injection 3 mL  3 mL Intravenous Q12H Orma Flaming, MD   3 mL at 06/09/22 1614   sodium chloride flush (NS) 0.9 %  injection 3 mL  3 mL Intravenous PRN Orma Flaming, MD         Objective: Vital signs in last 24 hours: BP (!) 186/62 (BP Location: Right Arm)   Pulse 60   Temp 97.9 F (36.6 C) (Oral)   Resp 18   Ht _0  (1.702 m)   Wt 80.7 kg   SpO2 98%   BMI 27.86 kg/m   Intake/Output from previous day: No intake/output data recorded. Intake/Output this shift: No intake/output data recorded.   Physical Exam Vitals reviewed.  Constitutional:      Appearance: Normal appearance.     Comments: But medicated  Cardiovascular:     Comments: Paced rate of 60. Pulmonary:     Effort: Pulmonary effort is normal.     Breath sounds: Rales (bilateral) present.  Abdominal:     General: Abdomen is flat.     Palpations: Abdomen is soft.     Tenderness: There is abdominal tenderness (LLQ). There is left CVA tenderness.  Musculoskeletal:        General: Normal range of motion.  Skin:    General: Skin is warm and dry.  Neurological:     General: No focal deficit present.     Mental Status: She is alert and oriented to person, place, and time.  Psychiatric:        Mood and Affect: Mood normal.        Behavior: Behavior normal.     Lab Results:  Results for orders placed or performed during the hospital encounter of 06/09/22 (from the past 24 hour(s))  CBC with Differential     Status: Abnormal   Collection Time: 06/09/22 10:49 AM  Result Value Ref Range   WBC 11.0 (H)  4.0 - 10.5 K/uL   RBC 4.36 3.87 - 5.11 MIL/uL   Hemoglobin 12.7 12.0 - 15.0 g/dL   HCT 39.4 36.0 - 46.0 %   MCV 90.4 80.0 - 100.0 fL   MCH 29.1 26.0 - 34.0 pg   MCHC 32.2 30.0 - 36.0 g/dL   RDW 13.4 11.5 - 15.5 %   Platelets 213 150 - 400 K/uL   nRBC 0.0 0.0 - 0.2 %   Neutrophils Relative % 88 %   Neutro Abs 9.7 (H) 1.7 - 7.7 K/uL   Lymphocytes Relative 7 %   Lymphs Abs 0.7 0.7 - 4.0 K/uL   Monocytes Relative 5 %   Monocytes Absolute 0.5 0.1 - 1.0 K/uL   Eosinophils Relative 0 %   Eosinophils Absolute 0.0 0.0 -  0.5 K/uL   Basophils Relative 0 %   Basophils Absolute 0.0 0.0 - 0.1 K/uL   Immature Granulocytes 0 %   Abs Immature Granulocytes 0.03 0.00 - 0.07 K/uL  Comprehensive metabolic panel     Status: Abnormal   Collection Time: 06/09/22 10:49 AM  Result Value Ref Range   Sodium 138 135 - 145 mmol/L   Potassium 3.7 3.5 - 5.1 mmol/L   Chloride 106 98 - 111 mmol/L   CO2 23 22 - 32 mmol/L   Glucose, Bld 145 (H) 70 - 99 mg/dL   BUN 22 8 - 23 mg/dL   Creatinine, Ser 1.61 (H) 0.44 - 1.00 mg/dL   Calcium 9.8 8.9 - 10.3 mg/dL   Total Protein 7.4 6.5 - 8.1 g/dL   Albumin 3.8 3.5 - 5.0 g/dL   AST 24 15 - 41 U/L   ALT 18 0 - 44 U/L   Alkaline Phosphatase 85 38 - 126 U/L   Total Bilirubin 0.7 0.3 - 1.2 mg/dL   GFR, Estimated 32 (L) >60 mL/min   Anion gap 9 5 - 15  Lipase, blood     Status: None   Collection Time: 06/09/22 10:49 AM  Result Value Ref Range   Lipase 32 11 - 51 U/L  Lactic acid, plasma     Status: None   Collection Time: 06/09/22 10:49 AM  Result Value Ref Range   Lactic Acid, Venous 1.3 0.5 - 1.9 mmol/L  Brain natriuretic peptide     Status: Abnormal   Collection Time: 06/09/22 11:03 AM  Result Value Ref Range   B Natriuretic Peptide 593.4 (H) 0.0 - 100.0 pg/mL  Troponin I (High Sensitivity)     Status: Abnormal   Collection Time: 06/09/22 11:03 AM  Result Value Ref Range   Troponin I (High Sensitivity) 27 (H) <18 ng/L  Magnesium     Status: None   Collection Time: 06/09/22 11:03 AM  Result Value Ref Range   Magnesium 2.0 1.7 - 2.4 mg/dL  Troponin I (High Sensitivity)     Status: Abnormal   Collection Time: 06/09/22  2:05 PM  Result Value Ref Range   Troponin I (High Sensitivity) 40 (H) <18 ng/L  Magnesium     Status: None   Collection Time: 06/09/22  2:05 PM  Result Value Ref Range   Magnesium 1.9 1.7 - 2.4 mg/dL  Urinalysis, Routine w reflex microscopic Urine, Clean Catch     Status: Abnormal   Collection Time: 06/09/22  2:26 PM  Result Value Ref Range   Color,  Urine YELLOW YELLOW   APPearance CLOUDY (A) CLEAR   Specific Gravity, Urine 1.010 1.005 - 1.030   pH 6.5 5.0 -  8.0   Glucose, UA NEGATIVE NEGATIVE mg/dL   Hgb urine dipstick LARGE (A) NEGATIVE   Bilirubin Urine NEGATIVE NEGATIVE   Ketones, ur NEGATIVE NEGATIVE mg/dL   Protein, ur NEGATIVE NEGATIVE mg/dL   Nitrite NEGATIVE NEGATIVE   Leukocytes,Ua TRACE (A) NEGATIVE  Urinalysis, Microscopic (reflex)     Status: None   Collection Time: 06/09/22  2:26 PM  Result Value Ref Range   RBC / HPF >50 0 - 5 RBC/hpf   WBC, UA 0-5 0 - 5 WBC/hpf   Bacteria, UA NONE SEEN NONE SEEN   Squamous Epithelial / LPF 0-5 0 - 5   Mucus PRESENT     BMET Recent Labs    06/09/22 1049  NA 138  K 3.7  CL 106  CO2 23  GLUCOSE 145*  BUN 22  CREATININE 1.61*  CALCIUM 9.8   PT/INR No results for input(s): "LABPROT", "INR" in the last 72 hours. ABG No results for input(s): "PHART", "HCO3" in the last 72 hours.  Invalid input(s): "PCO2", "PO2"  Studies/Results: CT Abdomen Pelvis W Contrast  Result Date: 06/09/2022 CLINICAL DATA:  Left lower quadrant abdominal pain with frequent bowel movements since early this morning. EXAM: CT ABDOMEN AND PELVIS WITH CONTRAST TECHNIQUE: Multidetector CT imaging of the abdomen and pelvis was performed using the standard protocol following bolus administration of intravenous contrast. RADIATION DOSE REDUCTION: This exam was performed according to the departmental dose-optimization program which includes automated exposure control, adjustment of the mA and/or kV according to patient size and/or use of iterative reconstruction technique. CONTRAST:  98m OMNIPAQUE IOHEXOL 300 MG/ML  SOLN COMPARISON:  Current chest radiograph. FINDINGS: Lower chest: Patchy ground-glass and confluent opacities lung bases associated with small effusions. Hepatobiliary: No focal liver abnormality is seen. No gallstones, gallbladder wall thickening, or biliary dilatation. Pancreas: Unremarkable. No  pancreatic ductal dilatation or surrounding inflammatory changes. Spleen: Normal in size without focal abnormality. Adrenals/Urinary Tract: No adrenal masses. Moderate left hydronephrosis with significant left perinephric stranding/edema. Left ureter is dilated. There is a small stone in the mid ureter, and a second stone or stones in the distal ureter just below the pelvic brim, 5-6 mm in size. No right hydronephrosis. Nonobstructing stones noted in the left kidney, mostly in the lower pole. There are bilateral low-attenuation renal masses, largest on the right, mid to upper pole, 3.3 cm, all consistent with cysts. No follow-up recommended. Normal bladder. Stomach/Bowel: Stomach is within normal limits. Appendix appears normal. No evidence of bowel wall thickening, distention, or inflammatory changes. Vascular/Lymphatic: Aortic atherosclerosis. No aneurysm. No enlarged lymph nodes. Reproductive: Status post hysterectomy. No adnexal masses. Other: None. Musculoskeletal: No fracture or acute finding.  No bone lesion. IMPRESSION: 1. Left ureteral stones, 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, to 5 mm in size, leading to moderate left hydroureteronephrosis and significant left perinephric stranding/edema. 2. No other acute abnormality within the abdomen or pelvis. 3. Nonobstructing left intrarenal stones. 4. Small effusions with lung base opacities, which may reflect pulmonary edema. Consider infection if there are consistent clinical findings. 5. Aortic atherosclerosis. Electronically Signed   By: DLajean ManesM.D.   On: 06/09/2022 13:21   DG Chest Portable 1 View  Result Date: 06/09/2022 CLINICAL DATA:  Hypoxia. EXAM: PORTABLE CHEST 1 VIEW COMPARISON:  05/09/2022 FINDINGS: 1142 hours. Rightward patient rotation. Or cardiomegaly diffuse interstitial and alveolar opacity is new in the interval with a slight parahilar predominance. Present pacemaker again noted. Telemetry leads overlie the chest. IMPRESSION: Cardiomegaly with  pulmonary edema. Electronically Signed  By: Misty Stanley M.D.   On: 06/09/2022 12:03     Assessment/Plan: Left ureteral stone with pain and nausea.   She has persistent pain and nausea with vomiting with 2 left ureteral stones with the largest about 62m in the mid ureter and she also has some left renal stones.  She has hydronephrosis with perinephric stranding but the urine only has hematuria and she is afebrile with a minimal leukocytosis.  She has stable CKD.  She would benefit symptomatically from cystoscopy with left ureteral stenting with delayed ureteroscopy but it is not emergent.  I have reviewed the risks of bleeding, infection, ureteral injury, need for secondary procedures, thrombotic events and anesthetic complications.    CAD with CHF with bilateral pleural effusion and hypoxia on RA.   It would be best to optimize her cardiac situation prior to surgery, but a stent couple be placed under MAC if necessary.          No follow-ups on file.    CC: Dr. AOrma Flaming      JIrine Seal8/04/[id number]

## 2022-06-09 NOTE — Assessment & Plan Note (Signed)
Uses cpap at night, but declines one in hospital. She can not tolerate the mask.

## 2022-06-09 NOTE — Assessment & Plan Note (Signed)
Continue protonix  

## 2022-06-09 NOTE — Assessment & Plan Note (Addendum)
NSR, paced Plan on downtitrating amiodarone 200 mg daily on 8/7. Hold Eliquis until seen by urology

## 2022-06-09 NOTE — Assessment & Plan Note (Addendum)
Stable at her baseline (1.5-1.6) Continue to monitor

## 2022-06-09 NOTE — Assessment & Plan Note (Signed)
Continue crestor daily  

## 2022-06-09 NOTE — ED Notes (Signed)
RN unsuccessful with ab work/ IV. RN reached out to phlebotomy and IV team.

## 2022-06-09 NOTE — Assessment & Plan Note (Addendum)
81 year old presenting with acute LLQ pain, left CVA tenderness found to have left nephrolithiasis as well as stone in 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, 32m in size with moderate left hydroureteronephrosis and significant perinephric stranding/edema -[facility]urology consulted, dr. WJeffie Pollockand will see  -no infectious symptoms, fever, chills and UA does not appear infected. Lactic acid wnl  -continue with IV pain medication  -keep NPO incase of any procedure -hold aSA/eliquis until evaluated by urology

## 2022-06-09 NOTE — H&P (View-Only) (Signed)
Subjective:    Consult requested by Dr. Orma Flaming.  Barbara [name] is an 81 yo female who presented to the ER with severe left lower quadrant pain and nausea without vomiting as well as frequent BM's.  She has had no fever or voiding complaints but has had some ongoing hematuria intermittently for the past year.  She had a CT that showed 2 obstructing left ureteral stones and left renal stones in the lower pole.   Her UA had >50 RBC's but no WBC.  She has a mild leukocytosis.  She has CKD3 and her Cr was stable at 1.6.  ROS:  Review of Systems  Constitutional:  Positive for malaise/fatigue.  Gastrointestinal:  Positive for abdominal pain, nausea and vomiting.  Genitourinary:  Positive for hematuria.  All other systems reviewed and are negative.   Allergies  Allergen Reactions   Lidocaine Anaphylaxis   Morphine Other (See Comments)    "Body shuts down"   Procaine Hcl Anaphylaxis, Rash and Other (See Comments)    "Anything with 'caine' in it "   Sulfonamide Derivatives Hives   Norco [Hydrocodone-Acetaminophen] Other (See Comments)    "Made me sick"   Tramadol Nausea Only   Vytorin [Ezetimibe-Simvastatin] Other (See Comments)    Unknown   Tape Itching and Other (See Comments)    Patient prefers either paper tape or Coban wrap    Past Medical History:  Diagnosis Date   A-fib ([place])    Allergy    CAD (coronary artery disease)    sees Dr. Shelva Majestic  cardiac stents - 2000   CHF (congestive heart failure) ([place])    Colon polyps    Complication of anesthesia    rash/hives with "caines"   Dyspnea    02/12/18 " when my heart gets out of rhythm, it has not been out of rhythym- since I have been on Tikosyn (11/2017)   Dysrhythmia    afib fib   GERD (gastroesophageal reflux disease)    takes OTC- Omeprazole- prn   Heart murmur    History of stress test    show normal perfusion without scar or ischemia, post EF 68%   Hx of echocardiogram    show an EF 55%-60% range with grade 1  diastolic dysfunction, she had mitral anular calcification with mild MR, moderate LA dilation and mild pulmonary hypertension with a PA estimated pressure of 53m   Hyperlipidemia    Hypertension    NSTEMI (non-ST elevated myocardial infarction) ([place]    Osteoarthritis    Pneumonia    hx of 2015    PONV (postoperative nausea and vomiting)     Past Surgical History:  Procedure Laterality Date   CARDIAC CATHETERIZATION     11/2017   cardiac stents  2000   COLONOSCOPY  01-05-14   per Dr. JTeena Irani clear, no repeats needed    COLONOSCOPY     CORONARY STENT PLACEMENT  2000   in LAD   DIRECT LARYNGOSCOPY WITH RADIAESSE INJECTION N/A 02/13/2018   Procedure: DIRECT LARYNGOSCOPY WITH RADIAESSE INJECTION;  Surgeon: BMelida Quitter MD;  [facility];  Service: ENT;  Laterality: N/A;   EYE SURGERY Left    CATARACT REMOVAL   KNEE ARTHROSCOPY Left 01/06/2015   Procedure: LEFT KNEE ARTHROSCOPY, abrasion chondroplasty of the medial femerol condryl,medial and lateral menisectomy, microfracture , synovectomy of the suprpatellar pouch;  Surgeon: RLatanya Maudlin MD;  Location: [facility];  Service: Orthopedics;  Laterality: Left;   LEFT HEART CATH AND CORONARY ANGIOGRAPHY N/A  02/26/2019   Procedure: LEFT HEART CATH AND CORONARY ANGIOGRAPHY;  Surgeon: Troy Sine, MD;  Location: [place] [facility];  Service: Cardiovascular;  Laterality: N/A;   LEFT HEART CATH AND CORONARY ANGIOGRAPHY N/A 05/06/2022   Procedure: LEFT HEART CATH AND CORONARY ANGIOGRAPHY;  Surgeon: Nelva Bush, MD;  Location: [place] [facility];  Service: Cardiovascular;  Laterality: N/A;   MICROLARYNGOSCOPY W/VOCAL CORD INJECTION N/A 08/07/2018   Procedure: MICROLARYNGOSCOPY WITH VOCAL CORD INJECTION OF PROLARYN;  Surgeon: Melida Quitter, MD;  Location: [place];  Service: ENT;  Laterality: N/A;  JET VENTILATION   PACEMAKER IMPLANT N/A 05/08/2022   Procedure: PACEMAKER IMPLANT;  Surgeon: Constance Haw, MD;  Location: [place] [facility];   Service: Cardiovascular;  Laterality: N/A;   REVERSE SHOULDER ARTHROPLASTY Right 03/23/2020   Procedure: REVERSE SHOULDER ARTHROPLASTY;  Surgeon: Nicholes Stairs, MD;  Location: [place];  Service: Orthopedics;  Laterality: Right;   VAGINAL HYSTERECTOMY  1971    Social History   Socioeconomic History   Marital status: Married    Spouse name: Not on file   Number of children: 2   Years of education: Not on file   Highest education level: Not on file  Occupational History   Not on file  Tobacco Use   Smoking status: Former    Years: 40.00    Types: Cigarettes    Quit date: 11/04/1996    Years since quitting: 25.6   Smokeless tobacco: Never  Vaping Use   Vaping Use: Never used  Substance and Sexual Activity   Alcohol use: No    Alcohol/week: 0.0 standard drinks of alcohol   Drug use: No   Sexual activity: Not on file  Other Topics Concern   Not on file  Social History Narrative   Not on file   Social Determinants of Health   Financial Resource Strain: Low Risk  (06/06/2021)   Overall Financial Resource Strain (CARDIA)    Difficulty of Paying Living Expenses: Not hard at all  Food Insecurity: No Food Insecurity (06/06/2021)   Hunger Vital Sign    Worried About Running Out of Food in the Last Year: Never true    [place] in the Last Year: Never true  Transportation Needs: No Transportation Needs (06/06/2021)   PRAPARE - Hydrologist (Medical): No    Lack of Transportation (Non-Medical): No  Physical Activity: Inactive (06/06/2021)   Exercise Vital Sign    Days of Exercise per Week: 0 days    Minutes of Exercise per Session: 0 min  Stress: No Stress Concern Present (06/06/2021)   [place]    Feeling of Stress : Only a little  Social Connections: Moderately Integrated (06/06/2021)   Social Connection and Isolation Panel [NHANES]    Frequency of Communication with Friends and  Family: More than three times a week    Frequency of Social Gatherings with Friends and Family: More than three times a week    Attends Religious Services: More than 4 times per year    Active Member of [employer] or Organizations: No    Attends Archivist Meetings: Never    Marital Status: Married  Human resources officer Violence: Not At Risk (06/06/2021)   Humiliation, Afraid, Rape, and Kick questionnaire    Fear of Current or Ex-Partner: No    Emotionally Abused: No    Physically Abused: No    Sexually Abused: No  Family History  Problem Relation Age of Onset   Cancer Maternal Grandmother    Heart disease Maternal Grandfather     Anti-infectives: Anti-infectives (From admission, onward)    None       Current Facility-Administered Medications  Medication Dose Route Frequency Provider Last Rate Last Admin   0.9 %  sodium chloride infusion  250 mL Intravenous PRN Orma Flaming, MD       acetaminophen (TYLENOL) tablet 650 mg  650 mg Oral Q6H PRN Orma Flaming, MD       Or   acetaminophen (TYLENOL) suppository 650 mg  650 mg Rectal Q6H PRN Orma Flaming, MD       [START ON 06/10/2022] furosemide (LASIX) injection 40 mg  40 mg Intravenous Daily Orma Flaming, MD       hydrALAZINE (APRESOLINE) tablet 25 mg  25 mg Oral Q4H PRN Dwyane Dee, MD       HYDROmorphone (DILAUDID) injection 0.5 mg  0.5 mg Intravenous Q3H PRN Orma Flaming, MD   0.5 mg at 06/09/22 1633   labetalol (NORMODYNE) injection 10 mg  10 mg Intravenous Q4H PRN Dwyane Dee, MD   10 mg at 06/09/22 1641   lip balm (CARMEX) ointment   Topical PRN Orma Flaming, MD   Given at 06/09/22 1643   ondansetron (ZOFRAN) tablet 4 mg  4 mg Oral Q6H PRN Orma Flaming, MD       Or   ondansetron [facility]) injection 4 mg  4 mg Intravenous Q6H PRN Orma Flaming, MD       sodium chloride flush (NS) 0.9 % injection 3 mL  3 mL Intravenous Q12H Orma Flaming, MD   3 mL at 06/09/22 1614   sodium chloride flush (NS) 0.9 %  injection 3 mL  3 mL Intravenous PRN Orma Flaming, MD         Objective: Vital signs in last 24 hours: BP (!) 186/62 (BP Location: Right Arm)   Pulse 60   Temp 97.9 F (36.6 C) (Oral)   Resp 18   Ht _0  (1.702 m)   Wt 80.7 kg   SpO2 98%   BMI 27.86 kg/m   Intake/Output from previous day: No intake/output data recorded. Intake/Output this shift: No intake/output data recorded.   Physical Exam Vitals reviewed.  Constitutional:      Appearance: Normal appearance.     Comments: But medicated  Cardiovascular:     Comments: Paced rate of 60. Pulmonary:     Effort: Pulmonary effort is normal.     Breath sounds: Rales (bilateral) present.  Abdominal:     General: Abdomen is flat.     Palpations: Abdomen is soft.     Tenderness: There is abdominal tenderness (LLQ). There is left CVA tenderness.  Musculoskeletal:        General: Normal range of motion.  Skin:    General: Skin is warm and dry.  Neurological:     General: No focal deficit present.     Mental Status: She is alert and oriented to person, place, and time.  Psychiatric:        Mood and Affect: Mood normal.        Behavior: Behavior normal.     Lab Results:  Results for orders placed or performed during the hospital encounter of 06/09/22 (from the past 24 hour(s))  CBC with Differential     Status: Abnormal   Collection Time: 06/09/22 10:49 AM  Result Value Ref Range   WBC 11.0 (H)  4.0 - 10.5 K/uL   RBC 4.36 3.87 - 5.11 MIL/uL   Hemoglobin 12.7 12.0 - 15.0 g/dL   HCT 39.4 36.0 - 46.0 %   MCV 90.4 80.0 - 100.0 fL   MCH 29.1 26.0 - 34.0 pg   MCHC 32.2 30.0 - 36.0 g/dL   RDW 13.4 11.5 - 15.5 %   Platelets 213 150 - 400 K/uL   nRBC 0.0 0.0 - 0.2 %   Neutrophils Relative % 88 %   Neutro Abs 9.7 (H) 1.7 - 7.7 K/uL   Lymphocytes Relative 7 %   Lymphs Abs 0.7 0.7 - 4.0 K/uL   Monocytes Relative 5 %   Monocytes Absolute 0.5 0.1 - 1.0 K/uL   Eosinophils Relative 0 %   Eosinophils Absolute 0.0 0.0 -  0.5 K/uL   Basophils Relative 0 %   Basophils Absolute 0.0 0.0 - 0.1 K/uL   Immature Granulocytes 0 %   Abs Immature Granulocytes 0.03 0.00 - 0.07 K/uL  Comprehensive metabolic panel     Status: Abnormal   Collection Time: 06/09/22 10:49 AM  Result Value Ref Range   Sodium 138 135 - 145 mmol/L   Potassium 3.7 3.5 - 5.1 mmol/L   Chloride 106 98 - 111 mmol/L   CO2 23 22 - 32 mmol/L   Glucose, Bld 145 (H) 70 - 99 mg/dL   BUN 22 8 - 23 mg/dL   Creatinine, Ser 1.61 (H) 0.44 - 1.00 mg/dL   Calcium 9.8 8.9 - 10.3 mg/dL   Total Protein 7.4 6.5 - 8.1 g/dL   Albumin 3.8 3.5 - 5.0 g/dL   AST 24 15 - 41 U/L   ALT 18 0 - 44 U/L   Alkaline Phosphatase 85 38 - 126 U/L   Total Bilirubin 0.7 0.3 - 1.2 mg/dL   GFR, Estimated 32 (L) >60 mL/min   Anion gap 9 5 - 15  Lipase, blood     Status: None   Collection Time: 06/09/22 10:49 AM  Result Value Ref Range   Lipase 32 11 - 51 U/L  Lactic acid, plasma     Status: None   Collection Time: 06/09/22 10:49 AM  Result Value Ref Range   Lactic Acid, Venous 1.3 0.5 - 1.9 mmol/L  Brain natriuretic peptide     Status: Abnormal   Collection Time: 06/09/22 11:03 AM  Result Value Ref Range   B Natriuretic Peptide 593.4 (H) 0.0 - 100.0 pg/mL  Troponin I (High Sensitivity)     Status: Abnormal   Collection Time: 06/09/22 11:03 AM  Result Value Ref Range   Troponin I (High Sensitivity) 27 (H) <18 ng/L  Magnesium     Status: None   Collection Time: 06/09/22 11:03 AM  Result Value Ref Range   Magnesium 2.0 1.7 - 2.4 mg/dL  Troponin I (High Sensitivity)     Status: Abnormal   Collection Time: 06/09/22  2:05 PM  Result Value Ref Range   Troponin I (High Sensitivity) 40 (H) <18 ng/L  Magnesium     Status: None   Collection Time: 06/09/22  2:05 PM  Result Value Ref Range   Magnesium 1.9 1.7 - 2.4 mg/dL  Urinalysis, Routine w reflex microscopic Urine, Clean Catch     Status: Abnormal   Collection Time: 06/09/22  2:26 PM  Result Value Ref Range   Color,  Urine YELLOW YELLOW   APPearance CLOUDY (A) CLEAR   Specific Gravity, Urine 1.010 1.005 - 1.030   pH 6.5 5.0 -  8.0   Glucose, UA NEGATIVE NEGATIVE mg/dL   Hgb urine dipstick LARGE (A) NEGATIVE   Bilirubin Urine NEGATIVE NEGATIVE   Ketones, ur NEGATIVE NEGATIVE mg/dL   Protein, ur NEGATIVE NEGATIVE mg/dL   Nitrite NEGATIVE NEGATIVE   Leukocytes,Ua TRACE (A) NEGATIVE  Urinalysis, Microscopic (reflex)     Status: None   Collection Time: 06/09/22  2:26 PM  Result Value Ref Range   RBC / HPF >50 0 - 5 RBC/hpf   WBC, UA 0-5 0 - 5 WBC/hpf   Bacteria, UA NONE SEEN NONE SEEN   Squamous Epithelial / LPF 0-5 0 - 5   Mucus PRESENT     BMET Recent Labs    06/09/22 1049  NA 138  K 3.7  CL 106  CO2 23  GLUCOSE 145*  BUN 22  CREATININE 1.61*  CALCIUM 9.8   PT/INR No results for input(s): "LABPROT", "INR" in the last 72 hours. ABG No results for input(s): "PHART", "HCO3" in the last 72 hours.  Invalid input(s): "PCO2", "PO2"  Studies/Results: CT Abdomen Pelvis W Contrast  Result Date: 06/09/2022 CLINICAL DATA:  Left lower quadrant abdominal pain with frequent bowel movements since early this morning. EXAM: CT ABDOMEN AND PELVIS WITH CONTRAST TECHNIQUE: Multidetector CT imaging of the abdomen and pelvis was performed using the standard protocol following bolus administration of intravenous contrast. RADIATION DOSE REDUCTION: This exam was performed according to the departmental dose-optimization program which includes automated exposure control, adjustment of the mA and/or kV according to patient size and/or use of iterative reconstruction technique. CONTRAST:  29m OMNIPAQUE IOHEXOL 300 MG/ML  SOLN COMPARISON:  Current chest radiograph. FINDINGS: Lower chest: Patchy ground-glass and confluent opacities lung bases associated with small effusions. Hepatobiliary: No focal liver abnormality is seen. No gallstones, gallbladder wall thickening, or biliary dilatation. Pancreas: Unremarkable. No  pancreatic ductal dilatation or surrounding inflammatory changes. Spleen: Normal in size without focal abnormality. Adrenals/Urinary Tract: No adrenal masses. Moderate left hydronephrosis with significant left perinephric stranding/edema. Left ureter is dilated. There is a small stone in the mid ureter, and a second stone or stones in the distal ureter just below the pelvic brim, 5-6 mm in size. No right hydronephrosis. Nonobstructing stones noted in the left kidney, mostly in the lower pole. There are bilateral low-attenuation renal masses, largest on the right, mid to upper pole, 3.3 cm, all consistent with cysts. No follow-up recommended. Normal bladder. Stomach/Bowel: Stomach is within normal limits. Appendix appears normal. No evidence of bowel wall thickening, distention, or inflammatory changes. Vascular/Lymphatic: Aortic atherosclerosis. No aneurysm. No enlarged lymph nodes. Reproductive: Status post hysterectomy. No adnexal masses. Other: None. Musculoskeletal: No fracture or acute finding.  No bone lesion. IMPRESSION: 1. Left ureteral stones, 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, to 5 mm in size, leading to moderate left hydroureteronephrosis and significant left perinephric stranding/edema. 2. No other acute abnormality within the abdomen or pelvis. 3. Nonobstructing left intrarenal stones. 4. Small effusions with lung base opacities, which may reflect pulmonary edema. Consider infection if there are consistent clinical findings. 5. Aortic atherosclerosis. Electronically Signed   By: DLajean ManesM.D.   On: 06/09/2022 13:21   DG Chest Portable 1 View  Result Date: 06/09/2022 CLINICAL DATA:  Hypoxia. EXAM: PORTABLE CHEST 1 VIEW COMPARISON:  05/09/2022 FINDINGS: 1142 hours. Rightward patient rotation. Or cardiomegaly diffuse interstitial and alveolar opacity is new in the interval with a slight parahilar predominance. Present pacemaker again noted. Telemetry leads overlie the chest. IMPRESSION: Cardiomegaly with  pulmonary edema. Electronically Signed  By: Misty Stanley M.D.   On: 06/09/2022 12:03     Assessment/Plan: Left ureteral stone with pain and nausea.   She has persistent pain and nausea with vomiting with 2 left ureteral stones with the largest about 62m in the mid ureter and she also has some left renal stones.  She has hydronephrosis with perinephric stranding but the urine only has hematuria and she is afebrile with a minimal leukocytosis.  She has stable CKD.  She would benefit symptomatically from cystoscopy with left ureteral stenting with delayed ureteroscopy but it is not emergent.  I have reviewed the risks of bleeding, infection, ureteral injury, need for secondary procedures, thrombotic events and anesthetic complications.    CAD with CHF with bilateral pleural effusion and hypoxia on RA.   It would be best to optimize her cardiac situation prior to surgery, but a stent couple be placed under MAC if necessary.          No follow-ups on file.    CC: Dr. AOrma Flaming      JIrine Seal8/04/2022 [phone]

## 2022-06-10 ENCOUNTER — Telehealth: Payer: Self-pay | Admitting: Cardiovascular Disease

## 2022-06-10 ENCOUNTER — Other Ambulatory Visit: Payer: Self-pay | Admitting: Urology

## 2022-06-10 DIAGNOSIS — I10 Essential (primary) hypertension: Secondary | ICD-10-CM

## 2022-06-10 DIAGNOSIS — I5033 Acute on chronic diastolic (congestive) heart failure: Secondary | ICD-10-CM

## 2022-06-10 DIAGNOSIS — J9601 Acute respiratory failure with hypoxia: Secondary | ICD-10-CM

## 2022-06-10 DIAGNOSIS — I495 Sick sinus syndrome: Secondary | ICD-10-CM

## 2022-06-10 DIAGNOSIS — N201 Calculus of ureter: Secondary | ICD-10-CM | POA: Diagnosis not present

## 2022-06-10 DIAGNOSIS — J810 Acute pulmonary edema: Secondary | ICD-10-CM

## 2022-06-10 DIAGNOSIS — G4733 Obstructive sleep apnea (adult) (pediatric): Secondary | ICD-10-CM

## 2022-06-10 DIAGNOSIS — N200 Calculus of kidney: Secondary | ICD-10-CM | POA: Diagnosis not present

## 2022-06-10 DIAGNOSIS — E7800 Pure hypercholesterolemia, unspecified: Secondary | ICD-10-CM

## 2022-06-10 DIAGNOSIS — E785 Hyperlipidemia, unspecified: Secondary | ICD-10-CM

## 2022-06-10 DIAGNOSIS — Z01810 Encounter for preprocedural cardiovascular examination: Secondary | ICD-10-CM | POA: Diagnosis not present

## 2022-06-10 DIAGNOSIS — I480 Paroxysmal atrial fibrillation: Secondary | ICD-10-CM | POA: Diagnosis not present

## 2022-06-10 DIAGNOSIS — N1832 Chronic kidney disease, stage 3b: Secondary | ICD-10-CM

## 2022-06-10 LAB — CBC
HCT: 37.8 % (ref 36.0–46.0)
Hemoglobin: 11.7 g/dL — ABNORMAL LOW (ref 12.0–15.0)
MCH: 28.5 pg (ref 26.0–34.0)
MCHC: 31 g/dL (ref 30.0–36.0)
MCV: 92 fL (ref 80.0–100.0)
Platelets: 210 10*3/uL (ref 150–400)
RBC: 4.11 MIL/uL (ref 3.87–5.11)
RDW: 13.8 % (ref 11.5–15.5)
WBC: 11.1 10*3/uL — ABNORMAL HIGH (ref 4.0–10.5)
nRBC: 0 % (ref 0.0–0.2)

## 2022-06-10 LAB — URINE CULTURE: Special Requests: NORMAL

## 2022-06-10 LAB — BASIC METABOLIC PANEL
Anion gap: 9 (ref 5–15)
BUN: 27 mg/dL — ABNORMAL HIGH (ref 8–23)
CO2: 25 mmol/L (ref 22–32)
Calcium: 9.6 mg/dL (ref 8.9–10.3)
Chloride: 107 mmol/L (ref 98–111)
Creatinine, Ser: 2.03 mg/dL — ABNORMAL HIGH (ref 0.44–1.00)
GFR, Estimated: 24 mL/min — ABNORMAL LOW (ref >60)
Glucose, Bld: 124 mg/dL — ABNORMAL HIGH (ref 70–99)
Potassium: 3.8 mmol/L (ref 3.5–5.1)
Sodium: 141 mmol/L (ref 135–145)

## 2022-06-10 LAB — SARS CORONAVIRUS 2 BY RT PCR: SARS Coronavirus 2 by RT PCR: NEGATIVE

## 2022-06-10 MED ORDER — ACETAMINOPHEN 500 MG PO TABS: 1000.0000 mg | ORAL_TABLET | Freq: Once | ORAL | Status: DC

## 2022-06-10 MED ORDER — HYDRALAZINE HCL 50 MG PO TABS
50.0000 mg | ORAL_TABLET | Freq: Two times a day (BID) | ORAL | Status: DC
  Administered 2022-06-10 – 2022-06-12 (×5): 50 mg via ORAL
  Filled 2022-06-10 (×5): qty 1

## 2022-06-10 MED ORDER — ONDANSETRON HCL 4 MG/2ML IJ SOLN
INTRAMUSCULAR | Status: AC
  Filled 2022-06-10: qty 2

## 2022-06-10 MED ORDER — HYDROMORPHONE HCL 1 MG/ML IJ SOLN
0.5000 mg | INTRAMUSCULAR | Status: DC | PRN
  Administered 2022-06-11: 0.5 mg via INTRAVENOUS
  Administered 2022-06-11: 1 mg via INTRAVENOUS
  Filled 2022-06-10 (×2): qty 1

## 2022-06-10 MED ORDER — AMIODARONE HCL 200 MG PO TABS
200.0000 mg | ORAL_TABLET | Freq: Every day | ORAL | Status: DC
  Administered 2022-06-10 – 2022-06-14 (×5): 200 mg via ORAL
  Filled 2022-06-10 (×5): qty 1

## 2022-06-10 MED ORDER — DEXAMETHASONE SODIUM PHOSPHATE 10 MG/ML IJ SOLN
INTRAMUSCULAR | Status: AC
  Filled 2022-06-10: qty 1

## 2022-06-10 MED ORDER — PANTOPRAZOLE SODIUM 40 MG PO TBEC
40.0000 mg | DELAYED_RELEASE_TABLET | Freq: Every morning | ORAL | Status: DC
  Administered 2022-06-11 – 2022-06-14 (×4): 40 mg via ORAL
  Filled 2022-06-10 (×4): qty 1

## 2022-06-10 MED ORDER — FENTANYL CITRATE (PF) 100 MCG/2ML IJ SOLN
INTRAMUSCULAR | Status: AC
  Filled 2022-06-10: qty 2

## 2022-06-10 MED ORDER — ISOSORBIDE MONONITRATE ER 30 MG PO TB24
30.0000 mg | ORAL_TABLET | Freq: Every day | ORAL | Status: DC
  Administered 2022-06-10 – 2022-06-14 (×5): 30 mg via ORAL
  Filled 2022-06-10 (×5): qty 1

## 2022-06-10 MED ORDER — NITROGLYCERIN 0.4 MG SL SUBL: 0.4000 mg | SUBLINGUAL_TABLET | SUBLINGUAL | Status: DC | PRN

## 2022-06-10 MED ORDER — PROPOFOL 10 MG/ML IV BOLUS
INTRAVENOUS | Status: AC
  Filled 2022-06-10: qty 20

## 2022-06-10 MED ORDER — LORAZEPAM 0.5 MG PO TABS
0.2500 mg | ORAL_TABLET | Freq: Once | ORAL | Status: AC
Start: 2022-06-10 — End: 2022-06-10
  Administered 2022-06-10: 0.25 mg via ORAL
  Filled 2022-06-10: qty 1

## 2022-06-10 MED ORDER — LIDOCAINE HCL (PF) 2 % IJ SOLN
INTRAMUSCULAR | Status: AC
  Filled 2022-06-10: qty 5

## 2022-06-10 MED ORDER — METOPROLOL SUCCINATE ER 25 MG PO TB24
25.0000 mg | ORAL_TABLET | Freq: Every evening | ORAL | Status: DC
  Administered 2022-06-11 – 2022-06-13 (×3): 25 mg via ORAL
  Filled 2022-06-10 (×4): qty 1

## 2022-06-10 MED ORDER — HYDRALAZINE HCL 20 MG/ML IJ SOLN
10.0000 mg | Freq: Four times a day (QID) | INTRAMUSCULAR | Status: DC | PRN
Start: 2022-06-10 — End: 2022-06-14
  Administered 2022-06-10 – 2022-06-13 (×2): 10 mg via INTRAVENOUS
  Filled 2022-06-10 (×2): qty 1

## 2022-06-10 NOTE — Telephone Encounter (Addendum)
Patient with diagnosis of Afib on Eliquis for anticoagulation.    Procedure: CYSTOSCOPY LEFT URETEROSCOPY/HOLMIUM LASER/STENT PLACEMENT Date of procedure: 06/25/22   CHA2DS2-VASc Score = 5  This indicates a 7.2% annual risk of stroke. The patient's score is based upon: CHF History: 0 HTN History: 1 Diabetes History: 0 Stroke History: 0 Vascular Disease History: 1 Age Score: 2 Gender Score: 1   CrCl 35 ml/min Platelet count 183k  Per office protocol, patient can hold Eliquis for 2 days prior to procedure as requested.

## 2022-06-10 NOTE — Telephone Encounter (Signed)
I left a message for the patient to return my call to set up a tele visit for clearance.

## 2022-06-10 NOTE — Anesthesia Preprocedure Evaluation (Addendum)
Anesthesia Evaluation  Patient identified by MRN, date of birth, ID band Patient awake    Reviewed: Allergy & Precautions, NPO status , Patient's Chart, lab work & pertinent test results  History of Anesthesia Complications (+) PONV and history of anesthetic complications  Airway Mallampati: III  TM Distance: >3 FB Neck ROM: Full    Dental  (+) Dental Advisory Given   Pulmonary shortness of breath, sleep apnea and Continuous Positive Airway Pressure Ventilation , pneumonia, former smoker,  New O2 requirement   Pulmonary exam normal breath sounds clear to auscultation       Cardiovascular hypertension, Pt. on medications + CAD, + Past MI (Non Stemi 05/03/2022), + Cardiac Stents and +CHF  + dysrhythmias Atrial Fibrillation + pacemaker + Valvular Problems/Murmurs  Rhythm:Regular Rate:Normal + Systolic murmurs 04/06/9527 Echo 1. Left ventricular ejection fraction, by estimation, is >75%. The left ventricle has hyperdynamic function. The left ventricle has no regional wall motion abnormalities. Left ventricular diastolic parameters are indeterminate. Elevated left atrial pressure.  2. Right ventricular systolic function is normal. The right ventricular size is normal. There is mildly elevated pulmonary artery systolic pressure.  3. Left atrial size was moderately dilated.  4. The mitral valve is normal in structure. Moderate mitral valve regurgitation. No evidence of mitral stenosis.  5. The aortic valve is tricuspid. Aortic valve regurgitation is not visualized. Aortic valve sclerosis is present, with no evidence of aortic valve stenosis.  6. The inferior vena cava is normal in size with greater than 50% respiratory variability, suggesting right atrial pressure of 3 mmHg.    10/08/2020; TTE 1. Left ventricular ejection fraction, by estimation, is 70 to 75%. The left ventricle has hyperdynamic function. The left ventricle has no  regional wall motion abnormalities. There is moderate eccentric left ventricular hypertrophy of the apical segment. Left ventricular diastolic parameters are indeterminate. Elevated left ventricular end-diastolic pressure.  2. Right ventricular systolic function is normal. The right ventricular size is normal. Tricuspid regurgitation signal is inadequate for assessing PA pressure.  3. The mitral valve is abnormal. Mild mitral valve regurgitation.  Moderate mitral annular calcification.  4. The aortic valve is grossly normal. There is mild calcification of the aortic valve. There is mild thickening of the aortic valve. Aortic valve regurgitation is not visualized. Mild aortic valve sclerosis is present,  with no evidence of aortic valve stenosis.    Neuro/Psych  Neuromuscular disease    GI/Hepatic GERD  ,  Endo/Other    Renal/GU CRFRenal diseaseLab Results      Component                Value               Date                      CREATININE               2.03 (H)            06/10/2022                BUN                      27 (H)              06/10/2022                NA  141                 06/10/2022                K                        3.8                 06/10/2022                   Musculoskeletal  (+) Arthritis ,   Abdominal   Peds  Hematology  (+) Blood dyscrasia, anemia , Lab Results      Component                Value               Date                      WBC                      11.1 (H)            06/10/2022                HGB                      11.7 (L)            06/10/2022                HCT                      37.8                06/10/2022                  PLT                      210                 06/10/2022              Anesthesia Other Findings All: Lidocaine, Procaine, Morphine, sulfa, norco, Vytorin,Tramadol  Reproductive/Obstetrics                        Anesthesia Physical Anesthesia Plan  ASA:  3  Anesthesia Plan: General   Post-op Pain Management: Tylenol PO (pre-op)*   Induction: Intravenous  PONV Risk Score and Plan: 4 or greater and Treatment may vary due to age or medical condition, Ondansetron and Dexamethasone  Airway Management Planned: LMA  Additional Equipment: None  Intra-op Plan:   Post-operative Plan: Extubation in OR  Informed Consent: I have reviewed the patients History and Physical, chart, labs and discussed the procedure including the risks, benefits and alternatives for the proposed anesthesia with the patient or authorized representative who has indicated his/her understanding and acceptance.     Dental advisory given  Plan Discussed with: CRNA  Anesthesia Plan Comments:      Anesthesia Quick Evaluation

## 2022-06-10 NOTE — Progress Notes (Signed)
Subjective: Barbara [name] still has pain and nausea with chills but no fever.  Her Cr is up further.  ROS:  Review of Systems  Constitutional:  Positive for chills.  Gastrointestinal:  Positive for abdominal pain, nausea and vomiting.    Anti-infectives: Anti-infectives (From admission, onward)    None       Current Facility-Administered Medications  Medication Dose Route Frequency Provider Last Rate Last Admin   0.9 %  sodium chloride infusion  250 mL Intravenous PRN Orma Flaming, MD       acetaminophen (TYLENOL) tablet 650 mg  650 mg Oral Q6H PRN Orma Flaming, MD       Or   acetaminophen (TYLENOL) suppository 650 mg  650 mg Rectal Q6H PRN Orma Flaming, MD       furosemide (LASIX) injection 40 mg  40 mg Intravenous Daily Orma Flaming, MD       hydrALAZINE (APRESOLINE) tablet 25 mg  25 mg Oral Q4H PRN Dwyane Dee, MD   25 mg at 06/09/22 1722   HYDROmorphone (DILAUDID) injection 0.5 mg  0.5 mg Intravenous Q3H PRN Orma Flaming, MD   0.5 mg at 06/10/22 0432   labetalol (NORMODYNE) injection 10 mg  10 mg Intravenous Q4H PRN Dwyane Dee, MD   10 mg at 06/10/22 0432   lip balm (CARMEX) ointment   Topical PRN Orma Flaming, MD   Given at 06/09/22 1643   ondansetron (ZOFRAN) tablet 4 mg  4 mg Oral Q6H PRN Orma Flaming, MD       Or   ondansetron [facility]) injection 4 mg  4 mg Intravenous Q6H PRN Orma Flaming, MD   4 mg at 06/09/22 2154   sodium chloride flush (NS) 0.9 % injection 3 mL  3 mL Intravenous Q12H Orma Flaming, MD   3 mL at 06/09/22 2157   sodium chloride flush (NS) 0.9 % injection 3 mL  3 mL Intravenous PRN Orma Flaming, MD         Objective: Vital signs in last 24 hours: Temp:  [97.5 F (36.4 C)-99.1 F (37.3 C)] 99.1 F (37.3 C) (08/07 0424) Pulse Rate:  [55-63] 60 (08/07 0524) Resp:  [13-25] 20 (08/07 0424) BP: (153-203)/(54-87) 159/59 (08/07 0524) SpO2:  [88 %-99 %] 98 % (08/07 0424) Weight:  [79.4 kg-80.7 kg] 79.4 kg (08/07  0500)  Intake/Output from previous day: 08/06 0701 - 08/07 0700 In: 120 [P.O.:120] Out: 500 [Urine:500] Intake/Output this shift: No intake/output data recorded.   Physical Exam Vitals reviewed.  Constitutional:      Appearance: Normal appearance.  Cardiovascular:     Rate and Rhythm: Normal rate.  Pulmonary:     Effort: Pulmonary effort is normal. No respiratory distress.  Abdominal:     General: Abdomen is flat.     Palpations: Abdomen is soft.     Tenderness: There is abdominal tenderness (LLQ).  Neurological:     Mental Status: She is alert.     Lab Results:  Recent Labs    06/09/22 1049 06/10/22 0301  WBC 11.0* 11.1*  HGB 12.7 11.7*  HCT 39.4 37.8  PLT 213 210   BMET Recent Labs    06/09/22 1049 06/10/22 0301  NA 138 141  K 3.7 3.8  CL 106 107  CO2 23 25  GLUCOSE 145* 124*  BUN 22 27*  CREATININE 1.61* 2.03*  CALCIUM 9.8 9.6   PT/INR No results for input(s): "LABPROT", "INR" in the last 72 hours. ABG No results for input(s): "PHART", "HCO3" in  the last 72 hours.  Invalid input(s): "PCO2", "PO2"  Studies/Results: CT Abdomen Pelvis W Contrast  Result Date: 06/09/2022 CLINICAL DATA:  Left lower quadrant abdominal pain with frequent bowel movements since early this morning. EXAM: CT ABDOMEN AND PELVIS WITH CONTRAST TECHNIQUE: Multidetector CT imaging of the abdomen and pelvis was performed using the standard protocol following bolus administration of intravenous contrast. RADIATION DOSE REDUCTION: This exam was performed according to the departmental dose-optimization program which includes automated exposure control, adjustment of the mA and/or kV according to patient size and/or use of iterative reconstruction technique. CONTRAST:  68m OMNIPAQUE IOHEXOL 300 MG/ML  SOLN COMPARISON:  Current chest radiograph. FINDINGS: Lower chest: Patchy ground-glass and confluent opacities lung bases associated with small effusions. Hepatobiliary: No focal liver  abnormality is seen. No gallstones, gallbladder wall thickening, or biliary dilatation. Pancreas: Unremarkable. No pancreatic ductal dilatation or surrounding inflammatory changes. Spleen: Normal in size without focal abnormality. Adrenals/Urinary Tract: No adrenal masses. Moderate left hydronephrosis with significant left perinephric stranding/edema. Left ureter is dilated. There is a small stone in the mid ureter, and a second stone or stones in the distal ureter just below the pelvic brim, 5-6 mm in size. No right hydronephrosis. Nonobstructing stones noted in the left kidney, mostly in the lower pole. There are bilateral low-attenuation renal masses, largest on the right, mid to upper pole, 3.3 cm, all consistent with cysts. No follow-up recommended. Normal bladder. Stomach/Bowel: Stomach is within normal limits. Appendix appears normal. No evidence of bowel wall thickening, distention, or inflammatory changes. Vascular/Lymphatic: Aortic atherosclerosis. No aneurysm. No enlarged lymph nodes. Reproductive: Status post hysterectomy. No adnexal masses. Other: None. Musculoskeletal: No fracture or acute finding.  No bone lesion. IMPRESSION: 1. Left ureteral stones, 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, to 5 mm in size, leading to moderate left hydroureteronephrosis and significant left perinephric stranding/edema. 2. No other acute abnormality within the abdomen or pelvis. 3. Nonobstructing left intrarenal stones. 4. Small effusions with lung base opacities, which may reflect pulmonary edema. Consider infection if there are consistent clinical findings. 5. Aortic atherosclerosis. Electronically Signed   By: DLajean ManesM.D.   On: 06/09/2022 13:21   DG Chest Portable 1 View  Result Date: 06/09/2022 CLINICAL DATA:  Hypoxia. EXAM: PORTABLE CHEST 1 VIEW COMPARISON:  05/09/2022 FINDINGS: 1142 hours. Rightward patient rotation. Or cardiomegaly diffuse interstitial and alveolar opacity is new in the interval with a slight  parahilar predominance. Present pacemaker again noted. Telemetry leads overlie the chest. IMPRESSION: Cardiomegaly with pulmonary edema. Electronically Signed   By: EMisty StanleyM.D.   On: 06/09/2022 12:03     Assessment and Plan: Left ureteral stones.  She has persistent pain and nausea and progressive renal insufficiency.   If she is fit from a cardiac standpoint, she needs cystoscopy and stent insertion today.   I will get that scheduled.       LOS: 1 day    Barbara Seal8/05/2022 [phone] ID: GAndrey Cota female   DOB: 5May 08, 1942 81y.o.   MRN: [id number]

## 2022-06-10 NOTE — Plan of Care (Signed)
  Problem: Clinical Measurements: Goal: Cardiovascular complication will be avoided Outcome: Progressing   Problem: Safety: Goal: Ability to remain free from injury will improve Outcome: Progressing   Problem: Skin Integrity: Goal: Risk for impaired skin integrity will decrease Outcome: Progressing

## 2022-06-10 NOTE — Telephone Encounter (Signed)
Primary Cardiologist:Thomas Claiborne Billings, MD   Preoperative team, please contact this patient and set up a phone call appointment due to recent symptoms for further preoperative risk assessment. Please obtain consent and complete medication review. Thank you for your help.   I confirm that guidance regarding antiplatelet and oral anticoagulation therapy has been completed and, if necessary, noted below.   Emmaline Life, NP-C    06/10/2022, 4:43 PM [facility] [address] Office [phone] Fax [phone]

## 2022-06-10 NOTE — Progress Notes (Addendum)
Patient has been medicated with PRN labetolol X2 this shift for elevated BP. Pt at 0424 was 190/67, medicated with PRN labetolol. BP now 159/59 HR 60. Provider notified. Pt also medicated X1 with Zofran for nausea and vomiting this shift.

## 2022-06-10 NOTE — Telephone Encounter (Signed)
   Pre-operative Risk Assessment    Patient Name: Barbara [name]  DOB: 10-07-1941 MRN: [id number]      Request for Surgical Clearance    Procedure:   Ureteroscopy   Date of Surgery:  Clearance 06/25/22                                 Surgeon:  Dr. Sherrell Puller Group or Practice Name:  [facility]  Phone number:  [phone] Fax number:  [phone]   Type of Clearance Requested:   - Medical  - Pharmacy:  Hold Apixaban (Eliquis) Hold for 2 days   Type of Anesthesia:  General    Additional requests/questions:    Signed, April Henson   06/10/2022, 1:48 PM

## 2022-06-10 NOTE — Consult Note (Signed)
Cardiology Consultation:   Patient ID: Barbara [name] MRN: [id number]; DOB: 21-Oct-1941  Admit date: 06/09/2022 Date of Consult: 06/10/2022  PCP:  Laurey Morale, MD   [facility] Providers Cardiologist:  Shelva Majestic, MD  Electrophysiologist:  Will Meredith Leeds, MD  {  Patient Profile:   Barbara [name] is a 81 y.o. female with a hx of CAD, A-fib, SSS s/p recent PPM, hypertension, and hyperlipidemia who is being seen 06/10/2022 for the evaluation of preoperative risk evaluation at the request of Dr. Sherral Hammers.  History of Present Illness:   Barbara [name] has a history of bare-metal stent placed to the LAD November 2000.  She does have a history of bradycardia concerning for chronotropic incompetence.  Nuclear stress test December 2015 with normal perfusion and normal EF.  Echo 10/2014 LVEF 60 to 65%, moderate LVH, grade 1 DD.  She was diagnosed with atrial flutter during an office visit in January 2019 and started on Eliquis.  Subsequent echocardiogram within normal EF, grade 2 diastolic dysfunction, mild to moderate MR.  She was hospitalized for Tikosyn load and converted back to sinus rhythm.  Heart catheterization 2020 in the setting of chest pain and inferolateral T wave inversions showed nonobstructive CAD and widely patent LAD stent.   Echocardiogram in 2021 demonstrated LVEF of 70 to 75%, mildly elevated RV systolic pressure of 02.7, apical hypertrophy with obliteration of the LV cavity in systole suggestive of apical hypertrophic cardiomyopathy.  She was hospitalized in December 2021 with atrial fibrillation with RVR and restarted on Eliquis and Tikosyn.  Subsequent heart monitor revealed frequent PACs, nonsustained atrial tachycardia, without recurrent A-fib.  She was admitted June 2023 with NSTEMI and recurrent A-fib with RVR.  She was given 1 dose of Toprol and her heart rate decreased to the 20s-30s while in A-fib.  She was given glucagon and atropine with improvement  in her heart rate.  Heart catheterization performed on 05/06/2022 showed nonobstructive CAD with 30% in-stent restenosis in the proximal to mid LAD, otherwise no critical lesion to explain elevated troponin.  Echocardiogram 05/04/2022 with an EF greater than 75%, no regional wall motion abnormality, mildly elevated pulmonary artery systolic pressure, moderate LAE, moderate MR.  Patient underwent Medtronic dual-chamber pacemaker implantation by Dr. Curt Bears on 05/08/2022 for symptomatic bradycardia.  Postprocedure, Tikosyn was discontinued due to prolonged QT.  She was discharged on amiodarone PO load.  She was also recommended for outpatient genetic counseling for apical hypertrophic cardiomyopathy.  She was seen in follow-up with PA Eulas Post 05/16/2022 and was doing well.  She is currently maintained on: 81 mg aspirin 2.5 mg Eliquis twice daily 50 mg hydralazine 3 times daily 30 mg Imdur daily 25 mg Toprol 40 mg Crestor  She presented to [facility] ED with abdominal pain and nausea with progressive renal insufficiency found to have left ureteral stones.  She has pending cystoscopy with stent insertion.  Cardiology was asked to evaluate perioperative risk for Mace.  She denies any chest pain or shortness of breath. Her main concern is nausea, and she states she hasn't had food or medication in two days. She has had no issues since her last hospitalization up until her recent symptoms.   Past Medical History:  Diagnosis Date   A-fib [facility])    Allergy    CAD (coronary artery disease)    sees Dr. Shelva Majestic  cardiac stents - 2000   CHF (congestive heart failure) (HCC)    Colon polyps    Complication of anesthesia  rash/hives with "caines"   Dyspnea    02/12/18 " when my heart gets out of rhythm, it has not been out of rhythym- since I have been on Tikosyn (11/2017)   Dysrhythmia    afib fib   GERD (gastroesophageal reflux disease)    takes OTC- Omeprazole- prn   Heart murmur    History of stress test     show normal perfusion without scar or ischemia, post EF 68%   Hx of echocardiogram    show an EF 55%-60% range with grade 1 diastolic dysfunction, she had mitral anular calcification with mild MR, moderate LA dilation and mild pulmonary hypertension with a PA estimated pressure of 12m   Hyperlipidemia    Hypertension    NSTEMI (non-ST elevated myocardial infarction) ([place]    Osteoarthritis    Pneumonia    hx of 2015    PONV (postoperative nausea and vomiting)     Past Surgical History:  Procedure Laterality Date   CARDIAC CATHETERIZATION     11/2017   cardiac stents  2000   COLONOSCOPY  01-05-14   per Dr. JTeena Irani clear, no repeats needed    COLONOSCOPY     CORONARY STENT PLACEMENT  2000   in LAD   DIRECT LARYNGOSCOPY WITH RADIAESSE INJECTION N/A 02/13/2018   Procedure: DIRECT LARYNGOSCOPY WITH RADIAESSE INJECTION;  Surgeon: BMelida Quitter MD;  [place]  Service: ENT;  Laterality: N/A;   EYE SURGERY Left    CATARACT REMOVAL   KNEE ARTHROSCOPY Left 01/06/2015   Procedure: LEFT KNEE ARTHROSCOPY, abrasion chondroplasty of the medial femerol condryl,medial and lateral menisectomy, microfracture , synovectomy of the suprpatellar pouch;  Surgeon: RLatanya Maudlin MD;  Location: [facility];  Service: Orthopedics;  Laterality: Left;   LEFT HEART CATH AND CORONARY ANGIOGRAPHY N/A 02/26/2019   Procedure: LEFT HEART CATH AND CORONARY ANGIOGRAPHY;  Surgeon: KTroy Sine MD;  [place];  Service: Cardiovascular;  Laterality: N/A;   LEFT HEART CATH AND CORONARY ANGIOGRAPHY N/A 05/06/2022   Procedure: LEFT HEART CATH AND CORONARY ANGIOGRAPHY;  Surgeon: ENelva Bush MD;  [place] [facility];  Service: Cardiovascular;  Laterality: N/A;   MICROLARYNGOSCOPY W/VOCAL CORD INJECTION N/A 08/07/2018   Procedure: MICROLARYNGOSCOPY WITH VOCAL CORD INJECTION OF PROLARYN;  Surgeon: BMelida Quitter MD;  [place]  Service: ENT;  Laterality: N/A;  JET VENTILATION    PACEMAKER IMPLANT N/A 05/08/2022   Procedure: PACEMAKER IMPLANT;  Surgeon: CConstance Haw MD;  [place];  Service: Cardiovascular;  Laterality: N/A;   REVERSE SHOULDER ARTHROPLASTY Right 03/23/2020   Procedure: REVERSE SHOULDER ARTHROPLASTY;  Surgeon: RNicholes Stairs MD;  [place]  Service: Orthopedics;  Laterality: Right;   VAGINAL HYSTERECTOMY  1971     Home Medications:  Prior to Admission medications   Medication Sig Start Date End Date Taking? Authorizing Provider  acetaminophen (TYLENOL) 500 MG tablet Take 500 mg by mouth daily as needed for headache.   Yes [provider]  amiodarone (PACERONE) 200 MG tablet Take 1 tablet (200 mg total) by mouth daily. 06/10/22  Yes MAlmyra Deforest PA  apixaban (ELIQUIS) 2.5 MG TABS tablet Take 1 tablet (2.5 mg total) by mouth 2 (two) times daily. 02/11/22  Yes HMinus Breeding MD  aspirin 81 MG chewable tablet Chew 1 tablet (81 mg total) by mouth daily. 05/11/22  Yes SThurnell Lose MD  Cholecalciferol (VITAMIN D3) 2000 units TABS Take 2,000 Units by mouth daily.  Yes [provider]  dextromethorphan (DELSYM) 30 MG/5ML liquid Take 15 mg by mouth daily as needed for cough.   Yes [provider]  docusate sodium (COLACE) 100 MG capsule Take 1 capsule (100 mg total) by mouth 2 (two) times daily. Patient taking differently: Take 100 mg by mouth 2 (two) times daily as needed (constipation). 08/01/21  Yes Laurey Morale, MD  hydrALAZINE (APRESOLINE) 50 MG tablet Take 1 tablet (50 mg total) by mouth every 8 (eight) hours. Patient taking differently: Take 50 mg by mouth in the morning and at bedtime. 05/10/22  Yes Thurnell Lose, MD  ipratropium (ATROVENT) 0.06 % nasal spray Place 2 sprays into both nostrils 2 (two) times daily as needed (allergies). 04/28/18  Yes [provider]  isosorbide mononitrate (IMDUR) 30 MG 24 hr tablet Take 1 tablet (30 mg total) by mouth daily. 05/11/22  Yes Thurnell Lose, MD  ketoconazole (NIZORAL) 2 % cream Apply 1 application topically 2 (two) times daily as needed for irritation. 06/13/21  Yes Laurey Morale, MD  ketotifen (ZADITOR) 0.025 % ophthalmic solution Place 1 drop into the right eye daily as needed (dry eyes).   Yes [provider]  loratadine (CLARITIN) 10 MG tablet Take 10 mg by mouth daily as needed for allergies.   Yes [provider]  LORazepam (ATIVAN) 0.5 MG tablet TAKE 1 TABLET BY MOUTH EVERY 6 HOURS AS NEEDED FOR ANXIETY Patient taking differently: Take 0.5 mg by mouth at bedtime. 04/23/22  Yes Laurey Morale, MD  metoprolol succinate (TOPROL-XL) 25 MG 24 hr tablet Take 1 tablet (25 mg total) by mouth daily. Patient taking differently: Take 25 mg by mouth every evening. 06/05/22  Yes Shirley Friar, PA-C  Multiple Vitamin (MULTIVITAMIN WITH MINERALS) TABS tablet Take 1 tablet by mouth daily.   Yes [provider]  nitroGLYCERIN (NITROSTAT) 0.4 MG SL tablet DISSOLVE ONE TABLET UNDER THE TONGUE EVERY 5 MINUTES AS NEEDED FOR CHEST PAIN.  DO NOT EXCEED A TOTAL OF 3 DOSES IN 15 MINUTES Patient taking differently: Place 0.4 mg under the tongue every 5 (five) minutes as needed for chest pain. 06/21/21  Yes Troy Sine, MD  pantoprazole (PROTONIX) 40 MG tablet Take 1 tablet (40 mg total) by mouth daily. Patient taking differently: Take 40 mg by mouth every morning. 01/03/22  Yes Troy Sine, MD  polyethylene glycol powder (GLYCOLAX/MIRALAX) 17 GM/SCOOP powder Take 17 g by mouth daily. Patient taking differently: Take 17 g by mouth daily as needed for moderate constipation. 05/10/22  Yes Thurnell Lose, MD  Probiotic Product (PROBIOTIC GUMMIES PO) Take 2 tablets by mouth every morning.   Yes [provider]  rosuvastatin (CRESTOR) 40 MG tablet Take 1 tablet (40 mg total) by mouth daily. Patient taking differently: Take 40 mg by mouth at bedtime. 01/03/22  Yes Troy Sine, MD  triamcinolone  (NASACORT) 55 MCG/ACT nasal inhaler Place 1 spray into both nostrils daily as needed (for allergies).  10/17/12  Yes Laurey Morale, MD  PRESCRIPTION MEDICATION See admin instructions. CPAP- At bedtime    [provider]    Inpatient Medications: Scheduled Meds:  acetaminophen  1,000 mg Oral Once   furosemide  40 mg Intravenous Daily   sodium chloride flush  3 mL Intravenous Q12H   Continuous Infusions:  sodium chloride     PRN Meds: sodium chloride, acetaminophen **OR** acetaminophen, hydrALAZINE, HYDROmorphone (DILAUDID) injection, labetalol, lip balm, ondansetron **OR** ondansetron (ZOFRAN) IV,  sodium chloride flush  Allergies:    Allergies  Allergen Reactions   Lidocaine Anaphylaxis   Morphine Other (See Comments)    "Body shuts down"   Procaine Hcl Anaphylaxis, Rash and Other (See Comments)    "Anything with 'caine' in it "   Sulfonamide Derivatives Hives   Norco [Hydrocodone-Acetaminophen] Other (See Comments)    "Made me sick"   Tramadol Nausea Only   Vytorin [Ezetimibe-Simvastatin] Other (See Comments)    Unknown   Tape Itching and Other (See Comments)    Patient prefers either paper tape or Coban wrap    Social History:   Social History   Socioeconomic History   Marital status: Married    Spouse name: Not on file   Number of children: 2   Years of education: Not on file   Highest education level: Not on file  Occupational History   Not on file  Tobacco Use   Smoking status: Former    Years: 40.00    Types: Cigarettes    Quit date: 11/04/1996    Years since quitting: 25.6   Smokeless tobacco: Never  Vaping Use   Vaping Use: Never used  Substance and Sexual Activity   Alcohol use: No    Alcohol/week: 0.0 standard drinks of alcohol   Drug use: No   Sexual activity: Not on file  Other Topics Concern   Not on file  Social History Narrative   Not on file   Social Determinants of Health   Financial Resource Strain: Low Risk  (06/06/2021)    Overall Financial Resource Strain (CARDIA)    Difficulty of Paying Living Expenses: Not hard at all  Food Insecurity: No Food Insecurity (06/06/2021)   Hunger Vital Sign    Worried About Running Out of Food in the Last Year: Never true    [place] in the Last Year: Never true  Transportation Needs: No Transportation Needs (06/06/2021)   PRAPARE - Hydrologist (Medical): No    Lack of Transportation (Non-Medical): No  Physical Activity: Inactive (06/06/2021)   Exercise Vital Sign    Days of Exercise per Week: 0 days    Minutes of Exercise per Session: 0 min  Stress: No Stress Concern Present (06/06/2021)   [place]    Feeling of Stress : Only a little  Social Connections: Moderately Integrated (06/06/2021)   Social Connection and Isolation Panel [NHANES]    Frequency of Communication with Friends and Family: More than three times a week    Frequency of Social Gatherings with Friends and Family: More than three times a week    Attends Religious Services: More than 4 times per year    Active Member of [employer] or Organizations: No    Attends Archivist Meetings: Never    Marital Status: Married  Human resources officer Violence: Not At Risk (06/06/2021)   Humiliation, Afraid, Rape, and Kick questionnaire    Fear of Current or Ex-Partner: No    Emotionally Abused: No    Physically Abused: No    Sexually Abused: No    Family History:    Family History  Problem Relation Age of Onset   Cancer Maternal Grandmother    Heart disease Maternal Grandfather      ROS:  Please see the history of present illness.   All other ROS reviewed and negative.     Physical Exam/Data:   Vitals:   06/10/22  1301 06/10/22 1310 06/10/22 1442 06/10/22 1458  BP: (!) 170/58  (!) 170/62 (!) 182/68  Pulse: (!) 59 60    Resp: 20     Temp: 98.3 F (36.8 C)     TempSrc: Oral     SpO2: 99%     Weight:       Height:        Intake/Output Summary (Last 24 hours) at 06/10/2022 1737 Last data filed at 06/10/2022 1309 Gross per 24 hour  Intake 120 ml  Output 1100 ml  Net -980 ml      06/10/2022    5:00 AM 06/09/2022    4:11 PM 05/22/2022   11:28 AM  Last 3 Weights  Weight (lbs) 175 lb 0.7 oz 177 lb 14.6 oz 178 lb  Weight (kg) 79.4 kg 80.7 kg 80.74 kg     Body mass index is 27.42 kg/m.  General:  Well nourished, well developed. Appears uncomfortable, states she is nauseated HEENT: normal Neck: no JVD Vascular: No carotid bruits; Distal pulses 2+ bilaterally Cardiac:  normal S1, S2; RRR; no murmur Lungs:  clear to auscultation bilaterally, no wheezing, rhonchi or rales  Abd: soft, nontender, no hepatomegaly  Ext: no edema Musculoskeletal:  No deformities, BUE and BLE strength normal and equal Skin: warm and dry  Neuro:  CNs 2-12 intact, no focal abnormalities noted Psych:  Normal affect   EKG:  The EKG was personally reviewed and demonstrates:  A-paced rhythm HR 62, LVH, TWI throughout Telemetry:  Telemetry was personally reviewed and demonstrates:  a paced  Relevant CV Studies:  05/04/22: TTE  1. Left ventricular ejection fraction, by estimation, is >75%. The left  ventricle has hyperdynamic function. The left ventricle has no regional  wall motion abnormalities. Left ventricular diastolic parameters are  indeterminate. Elevated left atrial  pressure.   2. Right ventricular systolic function is normal. The right ventricular  size is normal. There is mildly elevated pulmonary artery systolic  pressure.   3. Left atrial size was moderately dilated.   4. The mitral valve is normal in structure. Moderate mitral valve  regurgitation. No evidence of mitral stenosis.   5. The aortic valve is tricuspid. Aortic valve regurgitation is not  visualized. Aortic valve sclerosis is present, with no evidence of aortic  valve stenosis.   6. The inferior vena cava is normal in size with greater than  50%  respiratory variability, suggesting right atrial pressure of 3 mmHg.    10/08/2020; TTE  1. Left ventricular ejection fraction, by estimation, is 70 to 75%. The  left ventricle has hyperdynamic function. The left ventricle has no  regional wall motion abnormalities. There is moderate eccentric left  ventricular hypertrophy of the apical  segment. Left ventricular diastolic parameters are indeterminate. Elevated  left ventricular end-diastolic pressure.   2. Right ventricular systolic function is normal. The right ventricular  size is normal. Tricuspid regurgitation signal is inadequate for assessing  PA pressure.   3. The mitral valve is abnormal. Mild mitral valve regurgitation.  Moderate mitral annular calcification.   4. The aortic valve is grossly normal. There is mild calcification of the  aortic valve. There is mild thickening of the aortic valve. Aortic valve  regurgitation is not visualized. Mild aortic valve sclerosis is present,  with no evidence of aortic valve  stenosis.   Comparison(s): No significant change from prior study.     02/26/2019: LHC Prox RCA to Mid RCA lesion is 15% stenosed. Ost LAD to Prox  LAD lesion is 20% stenosed. Prox Cx lesion is 20% stenosed.   Mild non-obstructive coronary artery disease with a patent proximal LAD stent with mild 20% intimal hyperplasia (inserted  09/1999); 20% proximal left circumflex narrowing, and mild irregularity of 10 to 15% in the mid RCA.   Hyperdynamic LV function with an "Ace of Spade "configuration and near cavity obliteration in the mid to apical segment during systole.  There is evidence for left ventricular hypertrophy.  LVEDP is 16 mm.  Laboratory Data:  High Sensitivity Troponin:   Recent Labs  Lab 06/09/22 1103 06/09/22 1405  TROPONINIHS 27* 40*     Chemistry Recent Labs  Lab 06/09/22 1049 06/09/22 1103 06/09/22 1405 06/10/22 0301  NA 138  --   --  141  K 3.7  --   --  3.8  CL 106  --   --  107   CO2 23  --   --  25  GLUCOSE 145*  --   --  124*  BUN 22  --   --  27*  CREATININE 1.61*  --   --  2.03*  CALCIUM 9.8  --   --  9.6  MG  --  2.0 1.9  --   GFRNONAA 32*  --   --  24*  ANIONGAP 9  --   --  9    Recent Labs  Lab 06/09/22 1049  PROT 7.4  ALBUMIN 3.8  AST 24  ALT 18  ALKPHOS 85  BILITOT 0.7   Lipids No results for input(s): "CHOL", "TRIG", "HDL", "LABVLDL", "LDLCALC", "CHOLHDL" in the last 168 hours.  Hematology Recent Labs  Lab 06/09/22 1049 06/10/22 0301  WBC 11.0* 11.1*  RBC 4.36 4.11  HGB 12.7 11.7*  HCT 39.4 37.8  MCV 90.4 92.0  MCH 29.1 28.5  MCHC 32.2 31.0  RDW 13.4 13.8  PLT 213 210   Thyroid No results for input(s): "TSH", "FREET4" in the last 168 hours.  BNP Recent Labs  Lab 06/09/22 1103  BNP 593.4*    DDimer No results for input(s): "DDIMER" in the last 168 hours.   Radiology/Studies:  CT Abdomen Pelvis W Contrast  Result Date: 06/09/2022 CLINICAL DATA:  Left lower quadrant abdominal pain with frequent bowel movements since early this morning. EXAM: CT ABDOMEN AND PELVIS WITH CONTRAST TECHNIQUE: Multidetector CT imaging of the abdomen and pelvis was performed using the standard protocol following bolus administration of intravenous contrast. RADIATION DOSE REDUCTION: This exam was performed according to the departmental dose-optimization program which includes automated exposure control, adjustment of the mA and/or kV according to patient size and/or use of iterative reconstruction technique. CONTRAST:  104m OMNIPAQUE IOHEXOL 300 MG/ML  SOLN COMPARISON:  Current chest radiograph. FINDINGS: Lower chest: Patchy ground-glass and confluent opacities lung bases associated with small effusions. Hepatobiliary: No focal liver abnormality is seen. No gallstones, gallbladder wall thickening, or biliary dilatation. Pancreas: Unremarkable. No pancreatic ductal dilatation or surrounding inflammatory changes. Spleen: Normal in size without focal abnormality.  Adrenals/Urinary Tract: No adrenal masses. Moderate left hydronephrosis with significant left perinephric stranding/edema. Left ureter is dilated. There is a small stone in the mid ureter, and a second stone or stones in the distal ureter just below the pelvic brim, 5-6 mm in size. No right hydronephrosis. Nonobstructing stones noted in the left kidney, mostly in the lower pole. There are bilateral low-attenuation renal masses, largest on the right, mid to upper pole, 3.3 cm, all consistent with cysts. No follow-up recommended. Normal bladder.  Stomach/Bowel: Stomach is within normal limits. Appendix appears normal. No evidence of bowel wall thickening, distention, or inflammatory changes. Vascular/Lymphatic: Aortic atherosclerosis. No aneurysm. No enlarged lymph nodes. Reproductive: Status post hysterectomy. No adnexal masses. Other: None. Musculoskeletal: No fracture or acute finding.  No bone lesion. IMPRESSION: 1. Left ureteral stones, 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, to 5 mm in size, leading to moderate left hydroureteronephrosis and significant left perinephric stranding/edema. 2. No other acute abnormality within the abdomen or pelvis. 3. Nonobstructing left intrarenal stones. 4. Small effusions with lung base opacities, which may reflect pulmonary edema. Consider infection if there are consistent clinical findings. 5. Aortic atherosclerosis. Electronically Signed   By: Lajean Manes M.D.   On: 06/09/2022 13:21   DG Chest Portable 1 View  Result Date: 06/09/2022 CLINICAL DATA:  Hypoxia. EXAM: PORTABLE CHEST 1 VIEW COMPARISON:  05/09/2022 FINDINGS: 1142 hours. Rightward patient rotation. Or cardiomegaly diffuse interstitial and alveolar opacity is new in the interval with a slight parahilar predominance. Present pacemaker again noted. Telemetry leads overlie the chest. IMPRESSION: Cardiomegaly with pulmonary edema. Electronically Signed   By: Misty Stanley M.D.   On: 06/09/2022 12:03     Assessment and Plan:    Hx of Afib with RVR SSS s/p medtronic PPM in place (05/08/22) Chronic anticoagulation Amiodarone therapy Eliquis on hold for surgery Resume when safe to do so per surgeon Continue amiodarone and BB  Apical hypertrophic cardiomyopathy Hypertension Hyperlipidemia CAD s/p BMS-LAD, ISR 30% on heart cath 05/2022 Continue ASA, Toprol, statin BNP 594 HS troponin 27 --> 40 Recent heart catheterization was reassuring Continue hydralazine and imdur as tolerated  Preoperative risk evaluation for MACE  According to the Revised Cardiac Risk Index (RCRI), her RCRI score is 1, predicting a 0.9% perioperative risk of major cardiac events.   The patient is not currently having active cardiac symptoms, and she has had recent cath without obstructive disease.  According to ACC/AHA Guidelines, no further testing is needed.  Proceed with surgery at acceptable risk.  Our service is available as needed in the peri-operative period.     [facility] will sign off.  Please contact us for additional questions or concerns.  Risk Assessment/Risk Scores:   CHA2DS2-VASc Score = 5   This indicates a 7.2% annual risk of stroke. The patient's score is based upon: CHF History: 0 HTN History: 1 Diabetes History: 0 Stroke History: 0 Vascular Disease History: 1 Age Score: 2 Gender Score: 1    For questions or updates, please contact [place] Please consult [email or web address] for contact info under    Signed, Buford Dresser, MD  06/10/2022 5:37 PM  Note assistance from Fabian Sharp, [place]

## 2022-06-10 NOTE — Progress Notes (Signed)
PROGRESS NOTE    Barbara SPINDEL  INO:[id number] DOB: Feb 23, 1941 DOA: 06/09/2022 PCP: Laurey Morale, MD     Brief Narrative:   81 y.o. 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   Presented to ED with complaints of LLQ pain that started around 4AM. She thought she needed to have a BM, but after that she still had the pain. Pain rated as a 10/10 and described as stabbing. Pain constant. No radiation. Nothing made it better or worse. Pain was so bad she called 911. She had some nausea, no vomiting. She has had no fever, but has had chills. She has never had a kidney stone before. She has had some blood in her urine since last year. She denies any dysuria, urgency or frequency.    She also has new oxygen requirement, but denies any shorntess of breath or cough. She has had increased fatigue with walking distances, but denies dyspnea. She has no new leg swelling. She has no orthopnea. PT states she has gained 4 pounds since 7/26, but comparing to her cardiologist weight she hadn't gained any.    Denies vision changes/headaches, chest pain or palpitations, shortness of breath or cough, abdominal pain, N/V/D, dysuria or leg swelling.      She does not smoke or drink alcohol.    ER Course:  vitals: temp: 97.5, bp: 193/85, HR: 63, RR: 18, oxygen: 94%on 3L [place] Pertinent labs: wbc: 11, creatinine: 1.61 (1.5-1.6), troponin 27, bnp: 593 CXR: cardiomegaly with pulmonary edema Ct abdo/pelvis: Left ureteral stones, 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, to 5 mm in size, leading to moderate left hydroureteronephrosis and significant left perinephric stranding/edema. In ED: urology consulted. Given '40mg'$  of lasix, hydralazine, zofran. TRH asked to admit.    Subjective: A/O x 4, positive left CVA tenderness.  Positive nausea.  Positive vomiting.  States negative prior kidney stones.   Assessment  & Plan: Covid vaccination;   Principal Problem:   Nephrolithiasis, 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, 65m in size, leading to moderate left hydroureteronephrosis and significant left perinephric stranding /edema Active Problems:   Acute respiratory failure with hypoxia (HCC)   Acute on chronic diastolic CHF (congestive heart failure) (HCC)   CAD (coronary artery disease/elevated troponin   CKD (chronic kidney disease), stage III (HCC)   PAF (paroxysmal atrial fibrillation) (HCC)   Essential hypertension   Hyperlipidemia   Gastroesophageal reflux disease   Tachycardia-bradycardia syndrome (HCC)   OSA (obstructive sleep apnea)  Nephrolithiasis, 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/ Left Hydroureteronephrosis  -571min size, LEFT ureter stone---> MODERATE LEFT hydroureteronephrosis.  See results below -Urology consulted, dr. WrJeffie Pollockill see -hold ASA/eliquis until evaluated by urology  -8/7 per Urology Left ureteral stones.  Requires Cystoscopy and stent insertion today.   -ADDENDUM surgery canceled, urology to reschedule   [place] with [place] ([place] found her to be 88% on room air and required 4L to maintain oxygenation -CXR positive pulmonary edema, most likely acute on chronic CHF + possible splinting from pain in abdomen.  -Given '40mg'$  lasix in ED with good output, continue to treat CHF -Continuous pulse ox - Titrate O2 to maintain SpO2> 92%   Acute on chronic diastolic CHF  -Dr. KeShelva Majesticatient's Cardiologist -Recent echo in 05/2022 showed EF of >7>09%diastolic indeterminate post NSTEMI  -Strict in and out - Daily weight - Lasix IV 40 mg daily - Interrogation of  PPM pending - 8/7 discussed case with cardiology will see patient today in order to clear for surgery.  Tachybradycardia syndrome - 05/2022 S/P Medtronic Dual Chamber PPM   CAD/Elevated troponin 05/03/22: NSTEMI. Cath at that time showed: Mild-moderate, non-obstructive coronary artery disease. And Patent  proximal/mid LAD stent with up to 30% in-stent restenosis. Mild, non obstructive disease Initial troponin mildly elevated at 27, delta pending. Has no chest pain. Likely elevated with demand ischemia in setting of acute respiratory failure. F/u on delta. LHC 1 month ago.  Continue medical management with imdur, toprol and crestor  Hold ASA in case needs surgical intervention with stone    PAF (paroxysmal atrial fibrillation) (HCC) NSR, paced Plan on downtitrating amiodarone 200 mg daily on 8/7. -Eliquis 2.5 mg BID (hold)   Essential hypertension Elevated, received hydralazine IV in ED, pain also likely contributing Continue home hydralazine '50mg'$  q 8 hours, imdur '30mg'$  daily, toprol-xl '25mg'$  daily  Symptomatic with blood pressure <161 systolic and did not tolerate titration of her hydralazine outpatient with cardiology    CKD (chronic kidney disease), stage III (baseline Cr 1.5-1.6) Lab Results  Component Value Date   CREATININE 2.03 (H) 06/10/2022   CREATININE 1.61 (H) 06/09/2022   CREATININE 1.64 (H) 05/10/2022   CREATININE 1.57 (H) 05/09/2022   CREATININE 1.80 (H) 05/08/2022    HLD -Continue crestor daily    GERD Continue protonix    OSA (obstructive sleep apnea) Uses cpap at night, but declines one in hospital. She can not tolerate the mask.      Mobility Assessment (last 72 hours)     Mobility Assessment     Row Name 06/09/22 2023 06/09/22 1600         Does patient have an order for bedrest or is patient medically unstable No - Continue assessment No - Continue assessment      What is the highest level of mobility based on the progressive mobility assessment? Level 5 (Walks with assist in room/hall) - Balance while stepping forward/back and can walk in room with assist - Complete Level 5 (Walks with assist in room/hall) - Balance while stepping forward/back and can walk in room with assist - Complete                        DVT prophylaxis: Eliquis (on hold  for surgery) Code Status: Full Family Communication:  Status is: Inpatient    Dispo: The patient is from: Home              Anticipated d/c is to: Home              Anticipated d/c date is: > 3 days              Patient currently is not medically stable to d/c.      Consultants:  [facility]  Procedures/Significant Events:    I have personally reviewed and interpreted all radiology studies and my findings are as above.  VENTILATOR SETTINGS:    Cultures   Antimicrobials:    Devices    LINES / TUBES:      Continuous Infusions:  sodium chloride       Objective: Vitals:   06/10/22 0019 06/10/22 0424 06/10/22 0500 06/10/22 0524  BP: (!) 155/59 (!) 190/67  (!) 159/59  Pulse: (!) 59 (!) 59  60  Resp: 19 20    Temp: 98.5 F (36.9 C) 99.1 F (37.3 C)    TempSrc:  Oral Oral    SpO2: 99% 98%    Weight:   79.4 kg   Height:        Intake/Output Summary (Last 24 hours) at 06/10/2022 0740 Last data filed at 06/09/2022 1847 Gross per 24 hour  Intake 120 ml  Output 500 ml  Net -380 ml   Filed Weights   06/09/22 1611 06/10/22 0500  Weight: 80.7 kg 79.4 kg    Examination:  General: A/O x 4, positive acute respiratory distress Eyes: negative scleral hemorrhage, negative anisocoria, negative icterus ENT: Negative Runny nose, negative gingival bleeding, Neck:  Negative scars, masses, torticollis, lymphadenopathy, JVD Lungs: Clear to auscultation bilaterally without wheezes or crackles Cardiovascular: Regular rate and rhythm without murmur gallop or rub normal S1 and S2, positive reproducible sternal pain. Abdomen: negative abdominal pain, nondistended, positive soft, bowel sounds, no rebound, no ascites, no appreciable mass, positive LEFT CVA tenderness radiating left groin/LLQ. Extremities: No significant cyanosis, clubbing, or edema bilateral lower extremities Skin: Negative rashes, lesions, ulcers Psychiatric:  Negative depression,  negative anxiety, negative fatigue, negative mania  Central nervous system:  Cranial nerves II through XII intact, tongue/uvula midline, all extremities muscle strength 5/5, sensation intact throughout, negative dysarthria, negative expressive aphasia, negative receptive aphasia.  .     Data Reviewed: Care during the described time interval was provided by me .  I have reviewed this patient's available data, including medical history, events of note, physical examination, and all test results as part of my evaluation.  CBC: Recent Labs  Lab 06/09/22 1049 06/10/22 0301  WBC 11.0* 11.1*  NEUTROABS 9.7*  --   HGB 12.7 11.7*  HCT 39.4 37.8  MCV 90.4 92.0  PLT 213 573   Basic Metabolic Panel: Recent Labs  Lab 06/09/22 1049 06/09/22 1103 06/09/22 1405 06/10/22 0301  NA 138  --   --  141  K 3.7  --   --  3.8  CL 106  --   --  107  CO2 23  --   --  25  GLUCOSE 145*  --   --  124*  BUN 22  --   --  27*  CREATININE 1.61*  --   --  2.03*  CALCIUM 9.8  --   --  9.6  MG  --  2.0 1.9  --    GFR: Estimated Creatinine Clearance: 22.8 mL/min (A) (by C-G formula based on SCr of 2.03 mg/dL (H)). Liver Function Tests: Recent Labs  Lab 06/09/22 1049  AST 24  ALT 18  ALKPHOS 85  BILITOT 0.7  PROT 7.4  ALBUMIN 3.8   Recent Labs  Lab 06/09/22 1049  LIPASE 32   No results for input(s): "AMMONIA" in the last 168 hours. Coagulation Profile: No results for input(s): "INR", "PROTIME" in the last 168 hours. Cardiac Enzymes: No results for input(s): "CKTOTAL", "CKMB", "CKMBINDEX", "TROPONINI" in the last 168 hours. BNP (last 3 results) No results for input(s): "PROBNP" in the last 8760 hours. HbA1C: No results for input(s): "HGBA1C" in the last 72 hours. CBG: No results for input(s): "GLUCAP" in the last 168 hours. Lipid Profile: No results for input(s): "CHOL", "HDL", "LDLCALC", "TRIG", "CHOLHDL", "LDLDIRECT" in the last 72 hours. Thyroid Function Tests: No results for  input(s): "TSH", "T4TOTAL", "FREET4", "T3FREE", "THYROIDAB" in the last 72 hours. Anemia Panel: No results for input(s): "VITAMINB12", "FOLATE", "FERRITIN", "TIBC", "IRON", "RETICCTPCT" in the last 72 hours. Sepsis Labs: Recent Labs  Lab 06/09/22 1049  LATICACIDVEN 1.3    No  results found for this or any previous visit (from the past 240 hour(s)).       Radiology Studies: CT Abdomen Pelvis W Contrast  Result Date: 06/09/2022 CLINICAL DATA:  Left lower quadrant abdominal pain with frequent bowel movements since early this morning. EXAM: CT ABDOMEN AND PELVIS WITH CONTRAST TECHNIQUE: Multidetector CT imaging of the abdomen and pelvis was performed using the standard protocol following bolus administration of intravenous contrast. RADIATION DOSE REDUCTION: This exam was performed according to the departmental dose-optimization program which includes automated exposure control, adjustment of the mA and/or kV according to patient size and/or use of iterative reconstruction technique. CONTRAST:  1m OMNIPAQUE IOHEXOL 300 MG/ML  SOLN COMPARISON:  Current chest radiograph. FINDINGS: Lower chest: Patchy ground-glass and confluent opacities lung bases associated with small effusions. Hepatobiliary: No focal liver abnormality is seen. No gallstones, gallbladder wall thickening, or biliary dilatation. Pancreas: Unremarkable. No pancreatic ductal dilatation or surrounding inflammatory changes. Spleen: Normal in size without focal abnormality. Adrenals/Urinary Tract: No adrenal masses. Moderate left hydronephrosis with significant left perinephric stranding/edema. Left ureter is dilated. There is a small stone in the mid ureter, and a second stone or stones in the distal ureter just below the pelvic brim, 5-6 mm in size. No right hydronephrosis. Nonobstructing stones noted in the left kidney, mostly in the lower pole. There are bilateral low-attenuation renal masses, largest on the right, mid to upper pole, 3.3  cm, all consistent with cysts. No follow-up recommended. Normal bladder. Stomach/Bowel: Stomach is within normal limits. Appendix appears normal. No evidence of bowel wall thickening, distention, or inflammatory changes. Vascular/Lymphatic: Aortic atherosclerosis. No aneurysm. No enlarged lymph nodes. Reproductive: Status post hysterectomy. No adnexal masses. Other: None. Musculoskeletal: No fracture or acute finding.  No bone lesion. IMPRESSION: 1. Left ureteral stones, 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, to 5 mm in size, leading to moderate left hydroureteronephrosis and significant left perinephric stranding/edema. 2. No other acute abnormality within the abdomen or pelvis. 3. Nonobstructing left intrarenal stones. 4. Small effusions with lung base opacities, which may reflect pulmonary edema. Consider infection if there are consistent clinical findings. 5. Aortic atherosclerosis. Electronically Signed   By: DLajean ManesM.D.   On: 06/09/2022 13:21   DG Chest Portable 1 View  Result Date: 06/09/2022 CLINICAL DATA:  Hypoxia. EXAM: PORTABLE CHEST 1 VIEW COMPARISON:  05/09/2022 FINDINGS: 1142 hours. Rightward patient rotation. Or cardiomegaly diffuse interstitial and alveolar opacity is new in the interval with a slight parahilar predominance. Present pacemaker again noted. Telemetry leads overlie the chest. IMPRESSION: Cardiomegaly with pulmonary edema. Electronically Signed   By: EMisty StanleyM.D.   On: 06/09/2022 12:03        Scheduled Meds:  furosemide  40 mg Intravenous Daily   sodium chloride flush  3 mL Intravenous Q12H   Continuous Infusions:  sodium chloride       LOS: 1 day    Time spent:40 min    Leonna Schlee, CGeraldo Docker MD [facility]   If 7PM-7AM, please contact night-coverage 06/10/2022, 7:40 AM

## 2022-06-11 ENCOUNTER — Inpatient Hospital Stay (HOSPITAL_COMMUNITY): Payer: Medicare Other | Admitting: Anesthesiology

## 2022-06-11 ENCOUNTER — Encounter (HOSPITAL_COMMUNITY)
Admission: EM | Disposition: A | Discharge status: Home Health | Payer: Self-pay | Source: Home / Self Care | Attending: Internal Medicine

## 2022-06-11 ENCOUNTER — Encounter (HOSPITAL_COMMUNITY): Payer: Self-pay | Admitting: Family Medicine

## 2022-06-11 ENCOUNTER — Inpatient Hospital Stay (HOSPITAL_COMMUNITY): Payer: Medicare Other

## 2022-06-11 DIAGNOSIS — I129 Hypertensive chronic kidney disease with stage 1 through stage 4 chronic kidney disease, or unspecified chronic kidney disease: Secondary | ICD-10-CM

## 2022-06-11 DIAGNOSIS — N202 Calculus of kidney with calculus of ureter: Secondary | ICD-10-CM

## 2022-06-11 DIAGNOSIS — I252 Old myocardial infarction: Secondary | ICD-10-CM | POA: Diagnosis not present

## 2022-06-11 DIAGNOSIS — I2510 Atherosclerotic heart disease of native coronary artery without angina pectoris: Secondary | ICD-10-CM

## 2022-06-11 DIAGNOSIS — N1832 Chronic kidney disease, stage 3b: Secondary | ICD-10-CM

## 2022-06-11 HISTORY — PX: CYSTOSCOPY WITH RETROGRADE PYELOGRAM, URETEROSCOPY AND STENT PLACEMENT: SHX5789

## 2022-06-11 LAB — MAGNESIUM: Magnesium: 2.1 mg/dL (ref 1.7–2.4)

## 2022-06-11 LAB — CBC WITH DIFFERENTIAL/PLATELET
Abs Immature Granulocytes: 0.04 10*3/uL (ref 0.00–0.07)
Basophils Absolute: 0 10*3/uL (ref 0.0–0.1)
Basophils Relative: 0 %
Eosinophils Absolute: 0.1 10*3/uL (ref 0.0–0.5)
Eosinophils Relative: 1 %
HCT: 36.1 % (ref 36.0–46.0)
Hemoglobin: 11.2 g/dL — ABNORMAL LOW (ref 12.0–15.0)
Immature Granulocytes: 0 %
Lymphocytes Relative: 6 %
Lymphs Abs: 0.7 10*3/uL (ref 0.7–4.0)
MCH: 28.6 pg (ref 26.0–34.0)
MCHC: 31 g/dL (ref 30.0–36.0)
MCV: 92.1 fL (ref 80.0–100.0)
Monocytes Absolute: 0.9 10*3/uL (ref 0.1–1.0)
Monocytes Relative: 9 %
Neutro Abs: 8.6 10*3/uL — ABNORMAL HIGH (ref 1.7–7.7)
Neutrophils Relative %: 84 %
Platelets: 204 10*3/uL (ref 150–400)
RBC: 3.92 MIL/uL (ref 3.87–5.11)
RDW: 13.8 % (ref 11.5–15.5)
WBC: 10.3 10*3/uL (ref 4.0–10.5)
nRBC: 0 % (ref 0.0–0.2)

## 2022-06-11 LAB — COMPREHENSIVE METABOLIC PANEL
ALT: 15 U/L (ref 0–44)
AST: 21 U/L (ref 15–41)
Albumin: 3.6 g/dL (ref 3.5–5.0)
Alkaline Phosphatase: 64 U/L (ref 38–126)
Anion gap: 10 (ref 5–15)
BUN: 36 mg/dL — ABNORMAL HIGH (ref 8–23)
CO2: 28 mmol/L (ref 22–32)
Calcium: 9.5 mg/dL (ref 8.9–10.3)
Chloride: 103 mmol/L (ref 98–111)
Creatinine, Ser: 2.49 mg/dL — ABNORMAL HIGH (ref 0.44–1.00)
GFR, Estimated: 19 mL/min — ABNORMAL LOW (ref >60)
Glucose, Bld: 111 mg/dL — ABNORMAL HIGH (ref 70–99)
Potassium: 3.7 mmol/L (ref 3.5–5.1)
Sodium: 141 mmol/L (ref 135–145)
Total Bilirubin: 1.1 mg/dL (ref 0.3–1.2)
Total Protein: 6.9 g/dL (ref 6.5–8.1)

## 2022-06-11 LAB — PHOSPHORUS: Phosphorus: 3.7 mg/dL (ref 2.5–4.6)

## 2022-06-11 SURGERY — CYSTOURETEROSCOPY, WITH RETROGRADE PYELOGRAM AND STENT INSERTION
Anesthesia: General | Laterality: Left

## 2022-06-11 MED ORDER — CHLORHEXIDINE GLUCONATE 0.12 % MT SOLN
15.0000 mL | Freq: Once | OROMUCOSAL | Status: AC
  Administered 2022-06-11: 15 mL via OROMUCOSAL

## 2022-06-11 MED ORDER — LACTATED RINGERS IV SOLN: INTRAVENOUS | Status: DC

## 2022-06-11 MED ORDER — CEFTRIAXONE SODIUM 2 G IJ SOLR
2.0000 g | Freq: Once | INTRAMUSCULAR | Status: AC
  Administered 2022-06-11: 2 g via INTRAVENOUS
  Filled 2022-06-11: qty 20

## 2022-06-11 MED ORDER — LIDOCAINE HCL (PF) 2 % IJ SOLN
INTRAMUSCULAR | Status: AC
  Filled 2022-06-11: qty 5

## 2022-06-11 MED ORDER — PHENYLEPHRINE HCL-NACL 20-0.9 MG/250ML-% IV SOLN
INTRAVENOUS | Status: DC | PRN
  Administered 2022-06-11: 40 ug/min via INTRAVENOUS

## 2022-06-11 MED ORDER — CHLORHEXIDINE GLUCONATE 0.12 % MT SOLN: 15.0000 mL | Freq: Once | OROMUCOSAL | Status: DC

## 2022-06-11 MED ORDER — ONDANSETRON HCL 4 MG/2ML IJ SOLN
INTRAMUSCULAR | Status: AC
  Filled 2022-06-11: qty 2

## 2022-06-11 MED ORDER — LIDOCAINE 2% (20 MG/ML) 5 ML SYRINGE
INTRAMUSCULAR | Status: DC | PRN
  Administered 2022-06-11: 80 mg via INTRAVENOUS

## 2022-06-11 MED ORDER — FENTANYL CITRATE (PF) 100 MCG/2ML IJ SOLN
INTRAMUSCULAR | Status: DC | PRN
  Administered 2022-06-11: 50 ug via INTRAVENOUS

## 2022-06-11 MED ORDER — ONDANSETRON HCL 4 MG/2ML IJ SOLN: 4.0000 mg | Freq: Once | INTRAMUSCULAR | Status: DC | PRN

## 2022-06-11 MED ORDER — SODIUM CHLORIDE 0.9 % IR SOLN
Status: DC | PRN
  Administered 2022-06-11: 3000 mL via INTRAVESICAL

## 2022-06-11 MED ORDER — FENTANYL CITRATE PF 50 MCG/ML IJ SOSY: 25.0000 ug | PREFILLED_SYRINGE | INTRAMUSCULAR | Status: DC | PRN

## 2022-06-11 MED ORDER — OXYCODONE HCL 5 MG PO TABS: 5.0000 mg | ORAL_TABLET | Freq: Once | ORAL | Status: DC | PRN

## 2022-06-11 MED ORDER — ACETAMINOPHEN 500 MG PO TABS
1000.0000 mg | ORAL_TABLET | Freq: Once | ORAL | Status: AC
Start: 2022-06-11 — End: 2022-06-11
  Administered 2022-06-11: 1000 mg via ORAL

## 2022-06-11 MED ORDER — IOHEXOL 300 MG/ML  SOLN
INTRAMUSCULAR | Status: DC | PRN
  Administered 2022-06-11: 3 mL via URETHRAL

## 2022-06-11 MED ORDER — PROPOFOL 10 MG/ML IV BOLUS
INTRAVENOUS | Status: AC
  Filled 2022-06-11: qty 20

## 2022-06-11 MED ORDER — DEXAMETHASONE SODIUM PHOSPHATE 10 MG/ML IJ SOLN
INTRAMUSCULAR | Status: DC | PRN
  Administered 2022-06-11: 4 mg via INTRAVENOUS

## 2022-06-11 MED ORDER — ACETAMINOPHEN 500 MG PO TABS
ORAL_TABLET | ORAL | Status: AC
  Filled 2022-06-11: qty 2

## 2022-06-11 MED ORDER — FENTANYL CITRATE (PF) 100 MCG/2ML IJ SOLN
INTRAMUSCULAR | Status: AC
  Filled 2022-06-11: qty 2

## 2022-06-11 MED ORDER — OXYCODONE HCL 5 MG/5ML PO SOLN: 5.0000 mg | Freq: Once | ORAL | Status: DC | PRN

## 2022-06-11 MED ORDER — DEXAMETHASONE SODIUM PHOSPHATE 10 MG/ML IJ SOLN
INTRAMUSCULAR | Status: AC
  Filled 2022-06-11: qty 1

## 2022-06-11 MED ORDER — SODIUM CHLORIDE 0.9 % IV SOLN: INTRAVENOUS | Status: DC | PRN

## 2022-06-11 MED ORDER — ACETAMINOPHEN 10 MG/ML IV SOLN: 1000.0000 mg | Freq: Once | INTRAVENOUS | Status: DC | PRN

## 2022-06-11 MED ORDER — ONDANSETRON HCL 4 MG/2ML IJ SOLN
INTRAMUSCULAR | Status: DC | PRN
  Administered 2022-06-11: 4 mg via INTRAVENOUS

## 2022-06-11 MED ORDER — PROPOFOL 10 MG/ML IV BOLUS
INTRAVENOUS | Status: DC | PRN
  Administered 2022-06-11: 110 mg via INTRAVENOUS
  Administered 2022-06-11: 30 mg via INTRAVENOUS

## 2022-06-11 SURGICAL SUPPLY — 24 items
BAG URO CATCHER STRL LF (MISCELLANEOUS) ×2 IMPLANT
BASKET LASER NITINOL 1.9FR (BASKET) IMPLANT
BASKET ZERO TIP NITINOL 2.4FR (BASKET) IMPLANT
BSKT STON RTRVL 120 1.9FR (BASKET)
BSKT STON RTRVL ZERO TP 2.4FR (BASKET)
CATH URETL OPEN END 6FR 70 (CATHETERS) ×2 IMPLANT
CLOTH BEACON ORANGE TIMEOUT ST (SAFETY) ×2 IMPLANT
EXTRACTOR STONE 1.7FRX115CM (UROLOGICAL SUPPLIES) IMPLANT
GLOVE BIO SURGEON STRL SZ7.5 (GLOVE) ×4 IMPLANT
GOWN STRL REUS W/ TWL XL LVL3 (GOWN DISPOSABLE) ×1 IMPLANT
GOWN STRL REUS W/TWL XL LVL3 (GOWN DISPOSABLE) ×4
GUIDEWIRE ANG ZIPWIRE 038X150 (WIRE) IMPLANT
GUIDEWIRE STR DUAL SENSOR (WIRE) ×2 IMPLANT
KIT TURNOVER KIT A (KITS) ×1 IMPLANT
LASER FIB FLEXIVA PULSE ID 365 (Laser) IMPLANT
MANIFOLD NEPTUNE II (INSTRUMENTS) ×2 IMPLANT
PACK CYSTO (CUSTOM PROCEDURE TRAY) ×2 IMPLANT
SHEATH NAVIGATOR HD 11/13X28 (SHEATH) IMPLANT
SHEATH NAVIGATOR HD 11/13X36 (SHEATH) IMPLANT
STENT URET 6FRX24 CONTOUR (STENTS) ×1 IMPLANT
TRACTIP FLEXIVA PULS ID 200XHI (Laser) IMPLANT
TRACTIP FLEXIVA PULSE ID 200 (Laser)
TUBING CONNECTING 10 (TUBING) ×2 IMPLANT
TUBING UROLOGY SET (TUBING) ×2 IMPLANT

## 2022-06-11 NOTE — Telephone Encounter (Signed)
The patient was cleared by Dr. Harrell Gave on 06/10/22 during her hospitalization.

## 2022-06-11 NOTE — Interval H&P Note (Signed)
History and Physical Interval Note:  06/11/2022 4:26 PM  Barbara [name]  has presented today for surgery, with the diagnosis of LEFT URETERAL STONE.  The various methods of treatment have been discussed with the patient and family. After consideration of risks, benefits and other options for treatment, the patient has consented to  Procedure(s): CYSTOSCOPY WITH RETROGRADE PYELOGRAM, LEFT STENT PLACEMENT (Left) as a surgical intervention.  The patient's history has been reviewed, patient examined, no change in status, stable for surgery.  I have reviewed the patient's chart and labs.  Questions were answered to the patient's satisfaction.  We will also plan for left ureteroscopy with laser lithotripsy.   Marton Redwood, III

## 2022-06-11 NOTE — Transfer of Care (Signed)
Immediate Anesthesia Transfer of Care Note  Patient: Barbara [name]  Procedure(s) Performed: CYSTOSCOPY WITH RETROGRADE PYELOGRAM, LEFT STENT PLACEMENT (Left)  Patient Location: PACU  Anesthesia Type:General  Level of Consciousness: awake, alert , oriented, drowsy and patient cooperative  Airway & Oxygen Therapy: Patient Spontanous Breathing and Patient connected to face mask oxygen  Post-op Assessment: Report given to RN and Post -op Vital signs reviewed and stable  Post vital signs: Reviewed and stable  Last Vitals:  Vitals Value Taken Time  BP 155/62 06/11/22 1712  Temp    Pulse 62 06/11/22 1713  Resp 16 06/11/22 1713  SpO2 100 % 06/11/22 1713  Vitals shown include unvalidated device data.  Last Pain:  Vitals:   06/11/22 1444  TempSrc:   PainSc: 0-No pain      Patients Stated Pain Goal: 3 (33/43/56 8616)  Complications: No notable events documented.

## 2022-06-11 NOTE — TOC Initial Note (Signed)
Transition of Care [facility]) - Initial/Assessment Note    Patient Details  Name: Barbara [name] MRN: [id number] Date of Birth: 09-12-1941  Transition of [facility]) CM/SW Contact:    Leeroy Cha, RN Phone Number: 06/11/2022, 8:12 AM  Clinical Narrative:                  Transition of Care [facility]) Screening Note   Patient Details  Name: Barbara [name] Date of Birth: [birth year]   Transition of Care [facility]) CM/SW Contact:    Leeroy Cha, RN Phone Number: 06/11/2022, 8:12 AM    Transition of Care Department [facility]) has reviewed patient and no TOC needs have been identified at this time. We will continue to monitor patient advancement through interdisciplinary progression rounds. If new patient transition needs arise, please place a TOC consult.    Expected Discharge Plan: Home/Self Care Barriers to Discharge: Continued Medical Work up   Patient Goals and CMS Choice Patient states their goals for this hospitalization and ongoing recovery are:: to go back home CMS Medicare.gov Compare Post Acute Care list provided to:: Patient    Expected Discharge Plan and Services Expected Discharge Plan: Home/Self Care   Discharge Planning Services: CM Consult   Living arrangements for the past 2 months: Single Family Home                                      Prior Living Arrangements/Services Living arrangements for the past 2 months: Single Family Home Lives with:: Spouse Patient language and need for interpreter reviewed:: Yes Do you feel safe going back to the place where you live?: Yes            Criminal Activity/Legal Involvement Pertinent to Current Situation/Hospitalization: No - Comment as needed  Activities of Daily Living Home Assistive Devices/Equipment: None ADL Screening (condition at time of admission) Patient's cognitive ability adequate to safely complete daily activities?: Yes Is the patient deaf or have difficulty hearing?:  No Does the patient have difficulty seeing, even when wearing glasses/contacts?: No Does the patient have difficulty concentrating, remembering, or making decisions?: No Patient able to express need for assistance with ADLs?: Yes Does the patient have difficulty dressing or bathing?: No Independently performs ADLs?: Yes (appropriate for developmental age) Does the patient have difficulty walking or climbing stairs?: No Weakness of Legs: Both Weakness of Arms/Hands: Both  Permission Sought/Granted                  Emotional Assessment Appearance:: Appears stated age Attitude/Demeanor/Rapport: Engaged Affect (typically observed): Calm Orientation: : Oriented to Place, Oriented to Self, Oriented to  Time, Oriented to Situation Alcohol / Substance Use: Never Used Psych Involvement: No (comment)  Admission diagnosis:  Nephrolithiasis [N20.0] Acute pulmonary edema (HCC) [J81.0] Acute respiratory failure with hypoxia ([place]) [J96.01] Left ureteral stone [N20.1] Patient Active Problem List   Diagnosis Date Noted   Nephrolithiasis, 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, 30m in size, leading to moderate left hydroureteronephrosis and significant left perinephric stranding /edema 06/09/2022   Acute respiratory failure with hypoxia ([place] 06/09/2022   OSA (obstructive sleep apnea) 06/09/2022   Tachycardia-bradycardia syndrome ([place]    Acquired trigger finger of right index finger 05/10/2021   Trigger thumb of left hand 05/10/2021   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   Atrial fibrillation ([place] 10/08/2020   Persistent atrial fibrillation (  [place]) 10/07/2020   CKD (chronic kidney disease), stage III ([place]) 05/20/2020   Apical variant hypertrophic cardiomyopathy ([place])    Chest pain of uncertain etiology    Chest pain    S/P reverse total shoulder arthroplasty, 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   Encounter for orthopedic follow-up care 02/18/2020   PAF (paroxysmal atrial  fibrillation) ([place]) 02/28/2019   Demand myocardial infarction ([place]) 02/25/2019   Pain in left knee 01/06/2018   Dysphonia 12/29/2017   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   Acute on chronic diastolic CHF (congestive heart failure) ([place]) 11/16/2017   Gastroesophageal reflux disease 11/06/2017   Bradycardia 12/27/2015   Exertional dyspnea 12/23/2014   Sinus bradycardia 09/13/2014   LUMBAGO 05/03/2010   VERTIGO 01/21/2009   TEMPOROMANDIBULAR JOINT PAIN 08/31/2008   Osteoarthritis 05/31/2008   TROCHANTERIC BURSITIS 05/31/2008   Essential hypertension 04/06/2007   CAD (coronary artery disease/elevated troponin 04/06/2007   ALLERGIC RHINITIS 04/06/2007   Personal history of colonic polyps 04/06/2007   Hyperlipidemia 04/06/2007   PCP:  Laurey Morale, MD Pharmacy:   [facility], [place] [address] [place] [address] Phone: [phone] Fax: [phone]  [facility] ([place], [place] [facility] [place] [address] Phone: [phone] Fax: [phone]  [facility] [address] [place] [address] Phone: [phone] Fax: [phone]     Social Determinants of Health (SDOH) Interventions    Readmission Risk Interventions     No data to display

## 2022-06-11 NOTE — Op Note (Signed)
Operative Note  Preoperative diagnosis:  1.  Left renal and ureteral calculus  Post operative diagnosis: 1.  Left renal and ureteral calculus  Procedure(s): 1.  Cystoscopy with left retrograde pyelogram and left ureteral stent placement  Surgeon: Link Snuffer, MD  Assistants: None  Anesthesia: General  Complications: None immediate  EBL: Minimal  Specimens: 1.  Aspirated bladder urine culture 2.  Left renal pelvis culture  Drains/Catheters: 1.  6 X 24 double-J ureteral stent  Intraoperative findings: 1.  Normal urethra. 2.  Large amount of debris within the bladder.  Some bladder erythema.  Concern for cystitis. 3.  Upon relief of obstruction on the left, there was a large amount of debris that effluxed from the left ureter.  Therefore given the concern for possible infection, stent was performed rather than ureteroscopy 2.  Left retrograde pyelogram revealed hydronephrosis  Indication: 81 year old female with left renal and ureteral calculi presents for the previously mentioned operation.  Description of procedure:  The patient was identified and consent was obtained.  The patient was taken to the operating room and placed in the supine position.  The patient was placed under general anesthesia.  Perioperative antibiotics were administered.  The patient was placed in dorsal lithotomy.  Patient was prepped and draped in a standard sterile fashion and a timeout was performed.  A 21 French rigid cystoscope was advanced into the urethra and into the bladder.  There was a large amount of debris in the bladder.  I aspirated through the scope and sent a culture.  I evacuated the debris from the bladder.  I passed a wire up the left ureter and into the kidney.  There was immediate large amount of brownish debris that effluxed from the left ureteral orifice.  I advanced an open-ended ureteral catheter over the wire and up to the renal pelvis.  Wire was withdrawn.  There was a  hydronephrotic drip.  I collected urine from the renal pelvis for culture.  Retrograde pyelogram was performed with the findings noted above.  A sensor wire was then advanced up to the kidney under fluoroscopic guidance.  A 6 X 24 double-J ureteral stent was advanced up to the kidney under fluoroscopic guidance.  The wire was withdrawn and fluoroscopy confirmed good proximal placement and direct visualization confirmed a good coil within the bladder.  The bladder was drained and the scope withdrawn.  This concluded the operation.  Patient tolerated procedure well and was stable postoperatively.  Plan: Continue ceftriaxone.  Follow-up on cultures.  She will need definitive ureteroscopy in 1 to 2 weeks or so.

## 2022-06-11 NOTE — Anesthesia Procedure Notes (Signed)
Procedure Name: LMA Insertion Date/Time: 06/11/2022 4:42 PM  Performed by: Jonna Munro, CRNAPre-anesthesia Checklist: Patient identified, Emergency Drugs available, Patient being monitored and Suction available Patient Re-evaluated:Patient Re-evaluated prior to induction Oxygen Delivery Method: Circle system utilized Preoxygenation: Pre-oxygenation with 100% oxygen Induction Type: IV induction LMA: LMA with gastric port inserted LMA Size: 4.0 Number of attempts: 2 Placement Confirmation: positive ETCO2 and breath sounds checked- equal and bilateral Tube secured with: Tape Dental Injury: Teeth and Oropharynx as per pre-operative assessment

## 2022-06-11 NOTE — Progress Notes (Signed)
PROGRESS NOTE    Barbara [name]  KPT:[id number] DOB: [birth year] DOA: 06/09/2022 PCP: Laurey Morale, MD     Brief Narrative:   81 y.o. 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   Presented to ED with complaints of LLQ pain that started around 4AM. She thought she needed to have a BM, but after that she still had the pain. Pain rated as a 10/10 and described as stabbing. Pain constant. No radiation. Nothing made it better or worse. Pain was so bad she called 911. She had some nausea, no vomiting. She has had no fever, but has had chills. She has never had a kidney stone before. She has had some blood in her urine since last year. She denies any dysuria, urgency or frequency.    She also has new oxygen requirement, but denies any shorntess of breath or cough. She has had increased fatigue with walking distances, but denies dyspnea. She has no new leg swelling. She has no orthopnea. PT states she has gained 4 pounds since 7/26, but comparing to her cardiologist weight she hadn't gained any.    Denies vision changes/headaches, chest pain or palpitations, shortness of breath or cough, abdominal pain, N/V/D, dysuria or leg swelling.      She does not smoke or drink alcohol.    ER Course:  vitals: temp: 97.5, bp: 193/85, HR: 63, RR: 18, oxygen: 94%on 3L [place] Pertinent labs: wbc: 11, creatinine: 1.61 (1.5-1.6), troponin 27, bnp: 593 CXR: cardiomegaly with pulmonary edema Ct abdo/pelvis: Left ureteral stones, 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, to 5 mm in size, leading to moderate left hydroureteronephrosis and significant left perinephric stranding/edema. In ED: urology consulted. Given '40mg'$  of lasix, hydralazine, zofran. TRH asked to admit.    Subjective: 8/8 attempted on 2 occasions to see patient remains in surgery.  No charge   Assessment & Plan: Covid vaccination;    Principal Problem:   Nephrolithiasis, 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, 7m in size, leading to moderate left hydroureteronephrosis and significant left perinephric stranding /edema Active Problems:   Acute respiratory failure with hypoxia (HCC)   Acute on chronic diastolic CHF (congestive heart failure) (HCC)   CAD (coronary artery disease/elevated troponin   CKD (chronic kidney disease), stage III (HCC)   PAF (paroxysmal atrial fibrillation) (HCC)   Essential hypertension   Hyperlipidemia   Gastroesophageal reflux disease   Tachycardia-bradycardia syndrome (HCC)   OSA (obstructive sleep apnea)  Nephrolithiasis, 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/ Left Hydroureteronephrosis  -563min size, LEFT ureter stone---> MODERATE LEFT hydroureteronephrosis.  See results below -Urology consulted, dr. WrJeffie Pollockill see -hold ASA/eliquis until evaluated by urology  -8/7 per Urology Left ureteral stones.  Requires Cystoscopy and stent insertion today.   -ADDENDUM surgery canceled, urology to reschedule   Acute respiratory failure with hypoxia ([facility]-EMS found her to be 88% on room air and required 4L to maintain oxygenation -CXR positive pulmonary edema, most likely acute on chronic CHF + possible splinting from pain in abdomen.  -Given '40mg'$  lasix in ED with good output, continue to treat CHF -Continuous pulse ox - Titrate O2 to maintain SpO2> 92%   Acute on chronic diastolic CHF  -Dr. KeShelva Majesticatient's Cardiologist -Recent echo in 05/2022 showed EF of >7>17%diastolic indeterminate post NSTEMI  -Strict in and out -2.7 L - Daily weight Filed Weights   06/09/22 1611 06/10/22 0500 06/11/22 1444  Weight:  80.7 kg 79.4 kg 79 kg  -8/8 Lasix IV 40 mg daily (hold) -Hydralazine 50 mg BID -Hydralazine PRN -Imdur 30 mg daily -Labetalol PRN -Metoprolol 25 mg daily - Interrogation of PPM pending - 8/7 discussed case with cardiology will see patient today in order to clear for surgery. -8/7 ADDENDUM: Cleared for  surgery by cardiology -8/8 LR'@100ml'$ /hr  Tachybradycardia syndrome - 05/2022 S/P Medtronic Dual Chamber PPM   CAD/Elevated troponin -05/03/22: NSTEMI. Cath at that time showed: Mild-moderate, non-obstructive coronary artery disease. And Patent proximal/mid LAD stent with up to 30% in-stent restenosis. Mild, non obstructive disease -See CHF   PAF (paroxysmal atrial fibrillation) (HCC) -Paced NSR -Eliquis 2.5 mg BID (hold)   Essential hypertension -See CHF  CKD (chronic kidney disease), stage III (baseline Cr 1.5-1.6) Lab Results  Component Value Date   CREATININE 2.49 (H) 06/11/2022   CREATININE 2.03 (H) 06/10/2022   CREATININE 1.61 (H) 06/09/2022   CREATININE 1.64 (H) 05/10/2022   CREATININE 1.57 (H) 05/09/2022  -8/8 most likely dehydrated, see CHF  HLD -Continue crestor daily    GERD Continue protonix    OSA (obstructive sleep apnea) Uses cpap at night, but declines one in hospital. She can not tolerate the mask.      Mobility Assessment (last 72 hours)     Mobility Assessment     Row Name 06/11/22 0900 06/10/22 2108 06/10/22 0900 06/09/22 2023 06/09/22 1600   Does patient have an order for bedrest or is patient medically unstable No - Continue assessment No - Continue assessment No - Continue assessment No - Continue assessment No - Continue assessment   What is the highest level of mobility based on the progressive mobility assessment? Level 3 (Stands with assist) - Balance while standing  and cannot march in place Level 4 (Walks with assist in room) - Balance while marching in place and cannot step forward and back - Complete Level 5 (Walks with assist in room/hall) - Balance while stepping forward/back and can walk in room with assist - Complete Level 5 (Walks with assist in room/hall) - Balance while stepping forward/back and can walk in room with assist - Complete Level 5 (Walks with assist in room/hall) - Balance while stepping forward/back and can walk in room with  assist - Complete   Is the above level different from baseline mobility prior to current illness? Yes - Recommend PT order -- -- -- --                     DVT prophylaxis: Eliquis (on hold for surgery) Code Status: Full Family Communication:  Status is: Inpatient    Dispo: The patient is from: Home              Anticipated d/c is to: Home              Anticipated d/c date is: > 3 days              Patient currently is not medically stable to d/c.      Consultants:  [facility]    Procedures/Significant Events:    I have personally reviewed and interpreted all radiology studies and my findings are as above.  VENTILATOR SETTINGS:    Cultures   Antimicrobials: Anti-infectives (From admission, onward)    Start     Ordered Stop   06/11/22 0830  cefTRIAXone (ROCEPHIN) 2 g in sodium chloride 0.9 % 100 mL IVPB  06/11/22 0736 06/11/22 0926         Devices    LINES / TUBES:      Continuous Infusions:  [MAR Hold] sodium chloride     [MAR Hold] sodium chloride 10 mL/hr at 06/11/22 0848   lactated ringers 10 mL/hr at 06/11/22 1504     Objective: Vitals:   06/10/22 2208 06/11/22 0148 06/11/22 1339 06/11/22 1444  BP: (!) 152/52 (!) 136/47 (!) 163/53   Pulse:  (!) 58 66   Resp:  18 20   Temp:  98.8 F (37.1 C) 98.3 F (36.8 C)   TempSrc:  Oral Oral   SpO2: 98% 95% 92%   Weight:    79 kg  Height:    '5\' 7"'$  (1.702 m)    Intake/Output Summary (Last 24 hours) at 06/11/2022 1556 Last data filed at 06/11/2022 1300 Gross per 24 hour  Intake 123 ml  Output 1350 ml  Net -1227 ml    Filed Weights   06/09/22 1611 06/10/22 0500 06/11/22 1444  Weight: 80.7 kg 79.4 kg 79 kg    Examination:  8/8 attempted on 2 occasions to see patient remains in surgery.  No charge .     Data Reviewed: Care during the described time interval was provided by me .  I have reviewed this patient's available data, including medical  history, events of note, physical examination, and all test results as part of my evaluation.  CBC: Recent Labs  Lab 06/09/22 1049 06/10/22 0301 06/11/22 0313  WBC 11.0* 11.1* 10.3  NEUTROABS 9.7*  --  8.6*  HGB 12.7 11.7* 11.2*  HCT 39.4 37.8 36.1  MCV 90.4 92.0 92.1  PLT 213 210 454    Basic Metabolic Panel: Recent Labs  Lab 06/09/22 1049 06/09/22 1103 06/09/22 1405 06/10/22 0301 06/11/22 0313  NA 138  --   --  141 141  K 3.7  --   --  3.8 3.7  CL 106  --   --  107 103  CO2 23  --   --  25 28  GLUCOSE 145*  --   --  124* 111*  BUN 22  --   --  27* 36*  CREATININE 1.61*  --   --  2.03* 2.49*  CALCIUM 9.8  --   --  9.6 9.5  MG  --  2.0 1.9  --  2.1  PHOS  --   --   --   --  3.7    GFR: Estimated Creatinine Clearance: 18.5 mL/min (A) (by C-G formula based on SCr of 2.49 mg/dL (H)). Liver Function Tests: Recent Labs  Lab 06/09/22 1049 06/11/22 0313  AST 24 21  ALT 18 15  ALKPHOS 85 64  BILITOT 0.7 1.1  PROT 7.4 6.9  ALBUMIN 3.8 3.6    Recent Labs  Lab 06/09/22 1049  LIPASE 32    No results for input(s): "AMMONIA" in the last 168 hours. Coagulation Profile: No results for input(s): "INR", "PROTIME" in the last 168 hours. Cardiac Enzymes: No results for input(s): "CKTOTAL", "CKMB", "CKMBINDEX", "TROPONINI" in the last 168 hours. BNP (last 3 results) No results for input(s): "PROBNP" in the last 8760 hours. HbA1C: No results for input(s): "HGBA1C" in the last 72 hours. CBG: No results for input(s): "GLUCAP" in the last 168 hours. Lipid Profile: No results for input(s): "CHOL", "HDL", "LDLCALC", "TRIG", "CHOLHDL", "LDLDIRECT" in the last 72 hours. Thyroid Function Tests: No results for input(s): "TSH", "T4TOTAL", "FREET4", "T3FREE", "THYROIDAB" in  the last 72 hours. Anemia Panel: No results for input(s): "VITAMINB12", "FOLATE", "FERRITIN", "TIBC", "IRON", "RETICCTPCT" in the last 72 hours. Sepsis Labs: Recent Labs  Lab 06/09/22 1049   LATICACIDVEN 1.3     Recent Results (from the past 240 hour(s))  Urine Culture     Status: Abnormal   Collection Time: 06/09/22  2:26 PM   Specimen: Urine, Clean Catch  Result Value Ref Range Status   Specimen Description URINE, CLEAN CATCH  Final   Special Requests   Final    Normal Performed at [facility],  [address] [place], [place] [address]    Culture MULTIPLE SPECIES PRESENT, SUGGEST RECOLLECTION (A)  Final   Report Status 06/10/2022 FINAL  Final  SARS Coronavirus 2 by RT PCR (hospital order, performed in [facility] hospital lab) *cepheid single result test* Anterior Nasal Swab     Status: None   Collection Time: 06/10/22  7:44 AM   Specimen: Anterior Nasal Swab  Result Value Ref Range Status   SARS Coronavirus 2 by RT PCR NEGATIVE NEGATIVE Final    Comment: (NOTE) SARS-CoV-2 target nucleic acids are NOT DETECTED.  The SARS-CoV-2 RNA is generally detectable in upper and lower respiratory specimens during the acute phase of infection. The lowest concentration of SARS-CoV-2 viral copies this assay can detect is 250 copies / mL. A negative result does not preclude SARS-CoV-2 infection and should not be used as the sole basis for treatment or other patient management decisions.  A negative result may occur with improper specimen collection / handling, submission of specimen other than nasopharyngeal swab, presence of viral mutation(s) within the areas targeted by this assay, and inadequate number of viral copies (<250 copies / mL). A negative result must be combined with clinical observations, patient history, and epidemiological information.  Fact Sheet for Patients:   https://www.patel.info/  Fact Sheet for Healthcare Providers: https://hall.com/  This test is not yet approved or  cleared by the [place] FDA and has been authorized for detection and/or diagnosis of SARS-CoV-2 by FDA under an Emergency Use  Authorization (EUA).  This EUA will remain in effect (meaning this test can be used) for the duration of the COVID-19 declaration under Section 564(b)(1) of the Act, 21 U.S.C. section 360bbb-3(b)(1), unless the authorization is terminated or revoked sooner.  Performed at [facility], [place] [address], [place], [place] [address]   Culture, blood (Routine X 2) w Reflex to ID Panel     Status: None (Preliminary result)   Collection Time: 06/10/22  8:23 AM   Specimen: BLOOD  Result Value Ref Range Status   Specimen Description   Final    BLOOD BLOOD LEFT HAND Performed at [facility] [address], [place], [place] [address]    Special Requests   Final    BOTTLES DRAWN AEROBIC ONLY Blood Culture adequate volume Performed at [facility] [address], [place], [place] [address]    Culture   Final    NO GROWTH < 24 HOURS Performed at [facility] [address], [place] , [place] [address]    Report Status PENDING  Incomplete  Culture, blood (Routine X 2) w Reflex to ID Panel     Status: None (Preliminary result)   Collection Time: 06/10/22  8:29 AM   Specimen: BLOOD  Result Value Ref Range Status   Specimen Description   Final    BLOOD BLOOD RIGHT HAND Performed at [facility], [place] [address], [place], [place] [address]    Special Requests   Final    BOTTLES DRAWN AEROBIC ONLY Blood Culture adequate volume Performed at [facility] [address] [place], [place] [address]    Culture   Final    NO GROWTH < 24 HOURS Performed at [facility] [address] [place], [place] [address]    Report Status PENDING  Incomplete         Radiology Studies: No results found.      Scheduled Meds:  acetaminophen       [MAR Hold] amiodarone  200 mg Oral Daily   [MAR Hold] furosemide  40 mg Intravenous Daily   [MAR Hold] hydrALAZINE  50 mg Oral BID   [MAR Hold]  isosorbide mononitrate  30 mg Oral Daily   [MAR Hold] metoprolol succinate  25 mg Oral QPM   [MAR Hold] pantoprazole  40 mg Oral q morning   [MAR Hold] sodium chloride flush  3 mL Intravenous Q12H   Continuous Infusions:  [MAR Hold] sodium chloride     [MAR Hold] sodium chloride 10 mL/hr at 06/11/22 0848   lactated ringers 10 mL/hr at 06/11/22 1504     LOS: 2 days    Time spent:40 min    Afsana Liera, Geraldo Docker, MD [facility]   If 7PM-7AM, please contact night-coverage 06/11/2022, 3:56 PM

## 2022-06-11 NOTE — Interval H&P Note (Signed)
History and Physical Interval Note:  06/11/2022 4:26 PM  Barbara [name]  has presented today for surgery, with the diagnosis of LEFT URETERAL STONE.  The various methods of treatment have been discussed with the patient and family. After consideration of risks, benefits and other options for treatment, the patient has consented to  Procedure(s): CYSTOSCOPY WITH RETROGRADE PYELOGRAM, LEFT STENT PLACEMENT (Left) as a surgical intervention.  The patient's history has been reviewed, patient examined, no change in status, stable for surgery.  I have reviewed the patient's chart and labs.  Questions were answered to the patient's satisfaction.     Marton Redwood, III

## 2022-06-11 NOTE — Progress Notes (Addendum)
On-call provider notified of drop BP from 158/133 to 148/53 and recommended holding beta blocker. One time dose Ativan was administered per pt request to help sleep. Scheduled hydralazine administered. RN will continue to monitor BP.

## 2022-06-11 NOTE — Progress Notes (Signed)
Day of Surgery  Subjective: Bilan increased pain this AM with nausea but no fever.  Her Cr is up further.  Urine cultures just have Mx species.  ROS:  Review of Systems  Gastrointestinal:  Positive for abdominal pain and nausea.    Anti-infectives: Anti-infectives (From admission, onward)    None       Current Facility-Administered Medications  Medication Dose Route Frequency Provider Last Rate Last Admin   0.9 %  sodium chloride infusion  250 mL Intravenous PRN Orma Flaming, MD       acetaminophen (TYLENOL) tablet 650 mg  650 mg Oral Q6H PRN Orma Flaming, MD   650 mg at 06/10/22 1723   Or   acetaminophen (TYLENOL) suppository 650 mg  650 mg Rectal Q6H PRN Orma Flaming, MD       acetaminophen (TYLENOL) tablet 1,000 mg  1,000 mg Oral Once Lidia Collum, MD       amiodarone (PACERONE) tablet 200 mg  200 mg Oral Daily Allie Bossier, MD   200 mg at 06/10/22 2210   furosemide (LASIX) injection 40 mg  40 mg Intravenous Daily Orma Flaming, MD   40 mg at 06/10/22 0850   hydrALAZINE (APRESOLINE) injection 10 mg  10 mg Intravenous Q6H PRN Allie Bossier, MD   10 mg at 06/10/22 1514   hydrALAZINE (APRESOLINE) tablet 50 mg  50 mg Oral BID Allie Bossier, MD   50 mg at 06/10/22 2210   HYDROmorphone (DILAUDID) injection 0.5-1 mg  0.5-1 mg Intravenous Q3H PRN Allie Bossier, MD   0.5 mg at 06/11/22 0346   isosorbide mononitrate (IMDUR) 24 hr tablet 30 mg  30 mg Oral Daily Allie Bossier, MD   30 mg at 06/10/22 2210   labetalol (NORMODYNE) injection 10 mg  10 mg Intravenous Q4H PRN Dwyane Dee, MD   10 mg at 06/10/22 1315   lip balm (CARMEX) ointment   Topical PRN Orma Flaming, MD   Given at 06/09/22 1643   metoprolol succinate (TOPROL-XL) 24 hr tablet 25 mg  25 mg Oral QPM Allie Bossier, MD       nitroGLYCERIN (NITROSTAT) SL tablet 0.4 mg  0.4 mg Sublingual Q5 min PRN Allie Bossier, MD       ondansetron [facility]) tablet 4 mg  4 mg Oral Q6H PRN Orma Flaming, MD        Or   ondansetron [facility]) injection 4 mg  4 mg Intravenous Q6H PRN Orma Flaming, MD   4 mg at 06/10/22 1429   pantoprazole (PROTONIX) EC tablet 40 mg  40 mg Oral q morning Allie Bossier, MD       sodium chloride flush (NS) 0.9 % injection 3 mL  3 mL Intravenous Q12H Orma Flaming, MD   3 mL at 06/10/22 2119   sodium chloride flush (NS) 0.9 % injection 3 mL  3 mL Intravenous PRN Orma Flaming, MD         Objective: Vital signs in last 24 hours: Temp:  [98.3 F (36.8 C)-98.8 F (37.1 C)] 98.8 F (37.1 C) (08/08 0148) Pulse Rate:  [58-60] 58 (08/08 0148) Resp:  [18-20] 18 (08/08 0148) BP: (136-182)/(47-133) 136/47 (08/08 0148) SpO2:  [95 %-99 %] 95 % (08/08 0148)  Intake/Output from previous day: 08/07 0701 - 08/08 0700 In: 123 [P.O.:120; I.V.:3] Out: 1850 [Urine:1850] Intake/Output this shift: No intake/output data recorded.   Physical Exam Vitals reviewed.  Constitutional:      Appearance: Normal  appearance.  Neurological:     Mental Status: She is alert.     Lab Results:  Recent Labs    06/10/22 0301 06/11/22 0313  WBC 11.1* 10.3  HGB 11.7* 11.2*  HCT 37.8 36.1  PLT 210 204    BMET Recent Labs    06/10/22 0301 06/11/22 0313  NA 141 141  K 3.8 3.7  CL 107 103  CO2 25 28  GLUCOSE 124* 111*  BUN 27* 36*  CREATININE 2.03* 2.49*  CALCIUM 9.6 9.5    PT/INR No results for input(s): "LABPROT", "INR" in the last 72 hours. ABG No results for input(s): "PHART", "HCO3" in the last 72 hours.  Invalid input(s): "PCO2", "PO2"  Studies/Results: CT Abdomen Pelvis W Contrast  Result Date: 06/09/2022 CLINICAL DATA:  Left lower quadrant abdominal pain with frequent bowel movements since early this morning. EXAM: CT ABDOMEN AND PELVIS WITH CONTRAST TECHNIQUE: Multidetector CT imaging of the abdomen and pelvis was performed using the standard protocol following bolus administration of intravenous contrast. RADIATION DOSE REDUCTION: This exam was performed  according to the departmental dose-optimization program which includes automated exposure control, adjustment of the mA and/or kV according to patient size and/or use of iterative reconstruction technique. CONTRAST:  34m OMNIPAQUE IOHEXOL 300 MG/ML  SOLN COMPARISON:  Current chest radiograph. FINDINGS: Lower chest: Patchy ground-glass and confluent opacities lung bases associated with small effusions. Hepatobiliary: No focal liver abnormality is seen. No gallstones, gallbladder wall thickening, or biliary dilatation. Pancreas: Unremarkable. No pancreatic ductal dilatation or surrounding inflammatory changes. Spleen: Normal in size without focal abnormality. Adrenals/Urinary Tract: No adrenal masses. Moderate left hydronephrosis with significant left perinephric stranding/edema. Left ureter is dilated. There is a small stone in the mid ureter, and a second stone or stones in the distal ureter just below the pelvic brim, 5-6 mm in size. No right hydronephrosis. Nonobstructing stones noted in the left kidney, mostly in the lower pole. There are bilateral low-attenuation renal masses, largest on the right, mid to upper pole, 3.3 cm, all consistent with cysts. No follow-up recommended. Normal bladder. Stomach/Bowel: Stomach is within normal limits. Appendix appears normal. No evidence of bowel wall thickening, distention, or inflammatory changes. Vascular/Lymphatic: Aortic atherosclerosis. No aneurysm. No enlarged lymph nodes. Reproductive: Status post hysterectomy. No adnexal masses. Other: None. Musculoskeletal: No fracture or acute finding.  No bone lesion. IMPRESSION: 1. Left ureteral stones, 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, to 5 mm in size, leading to moderate left hydroureteronephrosis and significant left perinephric stranding/edema. 2. No other acute abnormality within the abdomen or pelvis. 3. Nonobstructing left intrarenal stones. 4. Small effusions with lung base opacities, which may reflect pulmonary edema.  Consider infection if there are consistent clinical findings. 5. Aortic atherosclerosis. Electronically Signed   By: DLajean ManesM.D.   On: 06/09/2022 13:21   DG Chest Portable 1 View  Result Date: 06/09/2022 CLINICAL DATA:  Hypoxia. EXAM: PORTABLE CHEST 1 VIEW COMPARISON:  05/09/2022 FINDINGS: 1142 hours. Rightward patient rotation. Or cardiomegaly diffuse interstitial and alveolar opacity is new in the interval with a slight parahilar predominance. Present pacemaker again noted. Telemetry leads overlie the chest. IMPRESSION: Cardiomegaly with pulmonary edema. Electronically Signed   By: EMisty StanleyM.D.   On: 06/09/2022 12:03     Assessment and Plan: Left ureteral stones.  She has persistent pain and nausea and progressive renal insufficiency.   She has been cleared by cardiology so I will get her scheduled with our on call Doc for cystoscopy with left RTG,  possible ureteroscopy with laser and possible stent.  I have reviewed the risks.         LOS: 2 days    Irine Seal 06/11/[phone] Patient ID: [name], female   DOB: [birth year], 81 y.o.   MRN: [id number] Patient ID: INEZ STANTZ, female   DOB: [birth year], 81 y.o.   MRN: [id number]

## 2022-06-11 NOTE — Anesthesia Postprocedure Evaluation (Signed)
Anesthesia Post Note  Patient: Barbara [name]  Procedure(s) Performed: CYSTOSCOPY WITH RETROGRADE PYELOGRAM, LEFT STENT PLACEMENT (Left)     Patient location during evaluation: PACU Anesthesia Type: General Level of consciousness: sedated and patient cooperative Pain management: pain level controlled Vital Signs Assessment: post-procedure vital signs reviewed and stable Respiratory status: spontaneous breathing Cardiovascular status: stable Anesthetic complications: no   No notable events documented.  Last Vitals:  Vitals:   06/11/22 1745 06/11/22 1808  BP: (!) 174/57 (!) 164/59  Pulse: 60 60  Resp: 15 20  Temp:  37.3 C  SpO2: 94% 92%    Last Pain:  Vitals:   06/11/22 1808  TempSrc: Oral  PainSc:                  Nolon Nations

## 2022-06-12 ENCOUNTER — Encounter (HOSPITAL_COMMUNITY): Payer: Self-pay | Admitting: Urology

## 2022-06-12 ENCOUNTER — Ambulatory Visit: Payer: Medicare Other

## 2022-06-12 ENCOUNTER — Telehealth: Payer: Self-pay

## 2022-06-12 DIAGNOSIS — N200 Calculus of kidney: Secondary | ICD-10-CM | POA: Diagnosis not present

## 2022-06-12 LAB — COMPREHENSIVE METABOLIC PANEL
ALT: 15 U/L (ref 0–44)
AST: 19 U/L (ref 15–41)
Albumin: 3.5 g/dL (ref 3.5–5.0)
Alkaline Phosphatase: 67 U/L (ref 38–126)
Anion gap: 12 (ref 5–15)
BUN: 46 mg/dL — ABNORMAL HIGH (ref 8–23)
CO2: 27 mmol/L (ref 22–32)
Calcium: 9.6 mg/dL (ref 8.9–10.3)
Chloride: 102 mmol/L (ref 98–111)
Creatinine, Ser: 2.47 mg/dL — ABNORMAL HIGH (ref 0.44–1.00)
GFR, Estimated: 19 mL/min — ABNORMAL LOW (ref >60)
Glucose, Bld: 132 mg/dL — ABNORMAL HIGH (ref 70–99)
Potassium: 3.8 mmol/L (ref 3.5–5.1)
Sodium: 141 mmol/L (ref 135–145)
Total Bilirubin: 0.8 mg/dL (ref 0.3–1.2)
Total Protein: 7.4 g/dL (ref 6.5–8.1)

## 2022-06-12 LAB — URINE CULTURE
Culture: NO GROWTH
Culture: NO GROWTH

## 2022-06-12 LAB — CBC WITH DIFFERENTIAL/PLATELET
Abs Immature Granulocytes: 0.03 10*3/uL (ref 0.00–0.07)
Basophils Absolute: 0 10*3/uL (ref 0.0–0.1)
Basophils Relative: 0 %
Eosinophils Absolute: 0 10*3/uL (ref 0.0–0.5)
Eosinophils Relative: 0 %
HCT: 37.4 % (ref 36.0–46.0)
Hemoglobin: 11.9 g/dL — ABNORMAL LOW (ref 12.0–15.0)
Immature Granulocytes: 0 %
Lymphocytes Relative: 6 %
Lymphs Abs: 0.5 10*3/uL — ABNORMAL LOW (ref 0.7–4.0)
MCH: 29.4 pg (ref 26.0–34.0)
MCHC: 31.8 g/dL (ref 30.0–36.0)
MCV: 92.3 fL (ref 80.0–100.0)
Monocytes Absolute: 0.2 10*3/uL (ref 0.1–1.0)
Monocytes Relative: 3 %
Neutro Abs: 6.5 10*3/uL (ref 1.7–7.7)
Neutrophils Relative %: 91 %
Platelets: 218 10*3/uL (ref 150–400)
RBC: 4.05 MIL/uL (ref 3.87–5.11)
RDW: 13.5 % (ref 11.5–15.5)
WBC: 7.2 10*3/uL (ref 4.0–10.5)
nRBC: 0 % (ref 0.0–0.2)

## 2022-06-12 LAB — PHOSPHORUS: Phosphorus: 3.4 mg/dL (ref 2.5–4.6)

## 2022-06-12 LAB — MAGNESIUM: Magnesium: 2.1 mg/dL (ref 1.7–2.4)

## 2022-06-12 MED ORDER — SALINE SPRAY 0.65 % NA SOLN
1.0000 | NASAL | Status: DC | PRN
  Filled 2022-06-12: qty 44

## 2022-06-12 MED ORDER — LORAZEPAM 0.5 MG PO TABS
0.2500 mg | ORAL_TABLET | Freq: Once | ORAL | Status: AC
  Administered 2022-06-12: 0.25 mg via ORAL
  Filled 2022-06-12: qty 1

## 2022-06-12 MED ORDER — ROSUVASTATIN CALCIUM 20 MG PO TABS
10.0000 mg | ORAL_TABLET | Freq: Every day | ORAL | Status: DC
  Administered 2022-06-12 – 2022-06-13 (×2): 10 mg via ORAL
  Filled 2022-06-12 (×2): qty 1

## 2022-06-12 MED ORDER — CEFADROXIL 500 MG PO CAPS
500.0000 mg | ORAL_CAPSULE | ORAL | Status: DC
  Administered 2022-06-12: 500 mg via ORAL
  Filled 2022-06-12 (×2): qty 1

## 2022-06-12 MED ORDER — POLYETHYLENE GLYCOL 3350 17 G PO PACK
17.0000 g | PACK | Freq: Every day | ORAL | Status: DC | PRN
  Administered 2022-06-12: 17 g via ORAL
  Filled 2022-06-12: qty 1

## 2022-06-12 MED ORDER — SODIUM CHLORIDE 0.9 % IV SOLN: INTRAVENOUS | Status: DC

## 2022-06-12 MED ORDER — APIXABAN 2.5 MG PO TABS
2.5000 mg | ORAL_TABLET | Freq: Two times a day (BID) | ORAL | Status: DC
  Administered 2022-06-12 – 2022-06-14 (×5): 2.5 mg via ORAL
  Filled 2022-06-12 (×5): qty 1

## 2022-06-12 NOTE — Telephone Encounter (Signed)
Contacted patient on preferred number listed in notes for scheduled AWV. Patient in the hospital and would like a f/u to reschedule.

## 2022-06-12 NOTE — Progress Notes (Signed)
PROGRESS NOTE    Barbara [name]  HGD:[id number] DOB: 08-19-1941 DOA: 06/09/2022 PCP: Laurey Morale, MD     Brief Narrative:  Barbara [name] is an 81 y.o. 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    Presented to ED with complaints of LLQ pain that started around 4AM. She thought she needed to have a BM, but after that she still had the pain. Pain rated as a 10/10 and described as stabbing. Pain constant. No radiation. Nothing made it better or worse. Pain was so bad she called 911. CT revealed left ureteral stones, 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, to 5 mm in size, leading to moderate left hydroureteronephrosis and significant left perinephric stranding/edema. Urology was consulted.  Patient underwent cystoscopy with left ureteral stent placement by Dr. Gloriann Loan 8/8.  New events last 24 hours / Subjective: Feeling well overall.  She has no complaints on examination today.  Assessment & Plan:  Principal Problem:   Nephrolithiasis, 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, 64m in size, leading to moderate left hydroureteronephrosis and significant left perinephric stranding /edema Active Problems:   Acute respiratory failure with hypoxia (HCC)   Acute on chronic diastolic CHF (congestive heart failure) (HCC)   CAD (coronary artery disease/elevated troponin   CKD (chronic kidney disease), stage III (HCC)   PAF (paroxysmal atrial fibrillation) (HCC)   Essential hypertension   Hyperlipidemia   Gastroesophageal reflux disease   Tachycardia-bradycardia syndrome (HCC)   OSA (obstructive sleep apnea)   Left renal stone with hydronephrosis  -S/p ureteral stent placement 8/8 -Will need ureteroscopy in 1-2 weeks  -Urine culture from the OR pending  Acute respiratory failure with hypoxia secondary to acute on chronic diastolic CHF  -CXR showed cardiomegaly with pulmonary edema -Continue  hydralazine, Imdur, metoprolol -Now on room air  AKI on CKD stage IIIb -Baseline Cr 1.6 -IVF   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 -Amiodarone  CAD, recent NSTEMI  Paroxysmal A-fib -Eliquis  HLD -Crestor  Demand ischemia -Secondary to above      DVT prophylaxis:  Place TED hose Start: 06/09/22 1536  Code Status: Full Family Communication: None at bedside Disposition Plan:  Status is: Inpatient Remains inpatient appropriate because: IVF    Antimicrobials:  Anti-infectives (From admission, onward)    Start     Dose/Rate Route Frequency Ordered Stop   06/11/22 0830  cefTRIAXone (ROCEPHIN) 2 g in sodium chloride 0.9 % 100 mL IVPB        2 g 200 mL/hr over 30 Minutes Intravenous  Once 06/11/22 0736 06/11/22 0926        Objective: Vitals:   06/12/22 0357 06/12/22 0434 06/12/22 0535 06/12/22 0920  BP: (!) 160/60  (!) 152/56 (!) 142/47  Pulse: (!) 58  60 60  Resp: '20  17 18  '$ Temp: 97.9 F (36.6 C)   98.5 F (36.9 C)  TempSrc:    Oral  SpO2: 94%   95%  Weight:  67 kg    Height:        Intake/Output Summary (Last 24 hours) at 06/12/2022 1229 Last data filed at 06/12/2022 0923 Gross per 24 hour  Intake 1147.89 ml  Output 1200 ml  Net -52.11 ml   Filed Weights   06/10/22 0500 06/11/22 1444 06/12/22 0434  Weight: 79.4 kg 79 kg 67 kg    Examination:  General exam: Appears calm and comfortable  Respiratory system: Clear to auscultation. Respiratory effort normal. No respiratory distress. No conversational dyspnea. Room air  Cardiovascular system: S1 & S2 heard, RRR. No murmurs. No pedal edema. Gastrointestinal system: Abdomen is nondistended, soft and nontender. Normal bowel sounds heard. Central nervous system: Alert and oriented. No focal neurological deficits. Speech clear.  Extremities: Symmetric in appearance  Skin: No rashes, lesions or ulcers on exposed skin  Psychiatry: Judgement and insight appear  normal. Mood & affect appropriate.   Data Reviewed: I have personally reviewed following labs and imaging studies  CBC: Recent Labs  Lab 06/09/22 1049 06/10/22 0301 06/11/22 0313 06/12/22 0348  WBC 11.0* 11.1* 10.3 7.2  NEUTROABS 9.7*  --  8.6* 6.5  HGB 12.7 11.7* 11.2* 11.9*  HCT 39.4 37.8 36.1 37.4  MCV 90.4 92.0 92.1 92.3  PLT 213 210 204 465   Basic Metabolic Panel: Recent Labs  Lab 06/09/22 1049 06/09/22 1103 06/09/22 1405 06/10/22 0301 06/11/22 0313 06/12/22 0348  NA 138  --   --  141 141 141  K 3.7  --   --  3.8 3.7 3.8  CL 106  --   --  107 103 102  CO2 23  --   --  '25 28 27  '$ GLUCOSE 145*  --   --  124* 111* 132*  BUN 22  --   --  27* 36* 46*  CREATININE 1.61*  --   --  2.03* 2.49* 2.47*  CALCIUM 9.8  --   --  9.6 9.5 9.6  MG  --  2.0 1.9  --  2.1 2.1  PHOS  --   --   --   --  3.7 3.4   GFR: Estimated Creatinine Clearance: 16.8 mL/min (A) (by C-G formula based on SCr of 2.47 mg/dL (H)). Liver Function Tests: Recent Labs  Lab 06/09/22 1049 06/11/22 0313 06/12/22 0348  AST '24 21 19  '$ ALT '18 15 15  '$ ALKPHOS 85 64 67  BILITOT 0.7 1.1 0.8  PROT 7.4 6.9 7.4  ALBUMIN 3.8 3.6 3.5   Recent Labs  Lab 06/09/22 1049  LIPASE 32   No results for input(s): "AMMONIA" in the last 168 hours. Coagulation Profile: No results for input(s): "INR", "PROTIME" in the last 168 hours. Cardiac Enzymes: No results for input(s): "CKTOTAL", "CKMB", "CKMBINDEX", "TROPONINI" in the last 168 hours. BNP (last 3 results) No results for input(s): "PROBNP" in the last 8760 hours. HbA1C: No results for input(s): "HGBA1C" in the last 72 hours. CBG: No results for input(s): "GLUCAP" in the last 168 hours. Lipid Profile: No results for input(s): "CHOL", "HDL", "LDLCALC", "TRIG", "CHOLHDL", "LDLDIRECT" in the last 72 hours. Thyroid Function Tests: No results for input(s): "TSH", "T4TOTAL", "FREET4", "T3FREE", "THYROIDAB" in the last 72 hours. Anemia Panel: No results for  input(s): "VITAMINB12", "FOLATE", "FERRITIN", "TIBC", "IRON", "RETICCTPCT" in the last 72 hours. Sepsis Labs: Recent Labs  Lab 06/09/22 1049  LATICACIDVEN 1.3    Recent Results (from the past 240 hour(s))  Urine Culture     Status: Abnormal   Collection Time: 06/09/22  2:26 PM   Specimen: Urine, Clean Catch  Result Value Ref Range Status   Specimen Description URINE, CLEAN CATCH  Final   Special Requests   Final    Normal Performed at [facility], [place] [address], [place], [place] [address]    Culture MULTIPLE SPECIES PRESENT, SUGGEST RECOLLECTION (A)  Final   Report Status 06/10/2022 FINAL  Final  SARS Coronavirus 2 by RT  PCR (hospital order, performed in [facility] hospital lab) *cepheid single result test* Anterior Nasal Swab     Status: None   Collection Time: 06/10/22  7:44 AM   Specimen: Anterior Nasal Swab  Result Value Ref Range Status   SARS Coronavirus 2 by RT PCR NEGATIVE NEGATIVE Final    Comment: (NOTE) SARS-CoV-2 target nucleic acids are NOT DETECTED.  The SARS-CoV-2 RNA is generally detectable in upper and lower respiratory specimens during the acute phase of infection. The lowest concentration of SARS-CoV-2 viral copies this assay can detect is 250 copies / mL. A negative result does not preclude SARS-CoV-2 infection and should not be used as the sole basis for treatment or other patient management decisions.  A negative result may occur with improper specimen collection / handling, submission of specimen other than nasopharyngeal swab, presence of viral mutation(s) within the areas targeted by this assay, and inadequate number of viral copies (<250 copies / mL). A negative result must be combined with clinical observations, patient history, and epidemiological information.  Fact Sheet for Patients:   https://www.patel.info/  Fact Sheet for Healthcare Providers: https://hall.com/  This test is not yet  approved or  cleared by the [place] FDA and has been authorized for detection and/or diagnosis of SARS-CoV-2 by FDA under an Emergency Use Authorization (EUA).  This EUA will remain in effect (meaning this test can be used) for the duration of the COVID-19 declaration under Section 564(b)(1) of the Act, 21 U.S.C. section 360bbb-3(b)(1), unless the authorization is terminated or revoked sooner.  Performed at [facility], [place] [address], [place], [place] [address]   Culture, blood (Routine X 2) w Reflex to ID Panel     Status: None (Preliminary result)   Collection Time: 06/10/22  8:23 AM   Specimen: BLOOD  Result Value Ref Range Status   Specimen Description   Final    BLOOD BLOOD LEFT HAND Performed at [facility] [address], [place], [place] [address]    Special Requests   Final    BOTTLES DRAWN AEROBIC ONLY Blood Culture adequate volume Performed at [facility] [address], [place], [place] [address]    Culture   Final    NO GROWTH 2 DAYS Performed at [facility] [address], [place], [place] [address]    Report Status PENDING  Incomplete  Culture, blood (Routine X 2) w Reflex to ID Panel     Status: None (Preliminary result)   Collection Time: 06/10/22  8:29 AM   Specimen: BLOOD  Result Value Ref Range Status   Specimen Description   Final    BLOOD BLOOD RIGHT HAND Performed at [facility] [address] [place], [place] [address]    Special Requests   Final    BOTTLES DRAWN AEROBIC ONLY Blood Culture adequate volume Performed at [facility] [address] [place], [place] [address]    Culture   Final    NO GROWTH 2 DAYS Performed at [facility] [address] [place], [place] [address]    Report Status PENDING  Incomplete      Radiology Studies: DG C-Arm 1-60 Min-No Report  Result Date: 06/11/2022 Fluoroscopy was utilized by  the requesting physician.  No radiographic interpretation.      Scheduled Meds:  amiodarone  200 mg Oral Daily   hydrALAZINE  50 mg Oral BID   isosorbide mononitrate  30 mg Oral  Daily   metoprolol succinate  25 mg Oral QPM   pantoprazole  40 mg Oral q morning   sodium chloride flush  3 mL Intravenous Q12H   Continuous Infusions:  sodium chloride     sodium chloride 10 mL/hr at 06/11/22 0848     LOS: 3 days     Dessa Phi, DO [facility] 06/12/2022, 12:29 PM   Available via Epic secure chat 7am-7pm After these hours, please refer to coverage provider listed on [email or web address]

## 2022-06-12 NOTE — Evaluation (Signed)
Physical Therapy Evaluation Patient Details Name: Barbara [name] MRN: [id number] DOB: 21-Jul-1941 Today's Date: 06/12/2022  History of Present Illness  81 yo female admitted with nephrolithiasis. S/P cystoscopy and L ureteral stent placement 8/8. Hx of NSTEMI, pacemaker, Afib, CHF, CAD, CKD, R rev TSA 2021  Clinical Impression  On eval, pt was Supv level for mobility. She walked ~200 feet with a RW. Pt tolerated distance well. Will plan to follow during hospital stay. Recommend OOB/ambulation with nursing as able.      Recommendations for follow up therapy are one component of a multi-disciplinary discharge planning process, led by the attending physician.  Recommendations may be updated based on patient status, additional functional criteria and insurance authorization.  Follow Up Recommendations  (resume HHPT visits -pt reports they have been coming 1x/week)      Assistance Recommended at Discharge PRN  Patient can return home with the following       Equipment Recommendations None recommended by PT  Recommendations for Other Services       Functional Status Assessment Patient has had a recent decline in their functional status and demonstrates the ability to make significant improvements in function in a reasonable and predictable amount of time.     Precautions / Restrictions Restrictions Weight Bearing Restrictions: No      Mobility  Bed Mobility Overal bed mobility: Modified Independent             General bed mobility comments: Sat EOB for a few minutes-pt reports she has a history of dizziness when she first sits up    Transfers Overall transfer level: Modified independent Equipment used: Rolling walker (2 wheels) Transfers: Sit to/from Stand             General transfer comment: Increased time.    Ambulation/Gait Ambulation/Gait assistance: Supervision Gait Distance (Feet): 200 Feet Assistive device: Rolling walker (2 wheels) Gait  Pattern/deviations: Step-through pattern, Decreased stride length       General Gait Details: Supv for safety. Pt denied dizziness.  Stairs            Wheelchair Mobility    Modified Rankin (Stroke Patients Only)       Balance Overall balance assessment: Mild deficits observed, not formally tested                                           Pertinent Vitals/Pain Pain Assessment Pain Assessment: No/denies pain    Home Living Family/patient expects to be discharged to:: Private residence Living Arrangements: Alone Available Help at Discharge: Family;Available PRN/intermittently Type of Home: House Home Access: Stairs to enter Entrance Stairs-Rails: None Entrance Stairs-Number of Steps: 1   Home Layout: One level Home Equipment: Cane - single Barista (2 wheels);Shower seat;BSC/3in1 ("upright walker")      Prior Function Prior Level of Function : Independent/Modified Independent             Mobility Comments: amb with SPC in community, drives short distances, has someone take her to appts and groceries ADLs Comments: indep, drives locally.     Hand Dominance   Dominant Hand: Right    Extremity/Trunk Assessment   Upper Extremity Assessment Upper Extremity Assessment: Overall WFL for tasks assessed    Lower Extremity Assessment Lower Extremity Assessment: Generalized weakness    Cervical / Trunk Assessment Cervical / Trunk Assessment: Normal  Communication   Communication: No  difficulties  Cognition Arousal/Alertness: Awake/alert Behavior During Therapy: WFL for tasks assessed/performed Overall Cognitive Status: Within Functional Limits for tasks assessed                                          General Comments      Exercises     Assessment/Plan    PT Assessment Patient needs continued PT services  PT Problem List Decreased mobility;Decreased balance;Decreased knowledge of use of  DME;Decreased strength       PT Treatment Interventions DME instruction;Gait training;Therapeutic exercise;Therapeutic activities;Patient/family education;Balance training;Functional mobility training    PT Goals (Current goals can be found in the Care Plan section)  Acute Rehab PT Goals Patient Stated Goal: home soon. PT Goal Formulation: With patient Time For Goal Achievement: 06/26/22 Potential to Achieve Goals: Good    Frequency Min 3X/week     Co-evaluation               AM-PAC PT "6 Clicks" Mobility  Outcome Measure Help needed turning from your back to your side while in a flat bed without using bedrails?: None Help needed moving from lying on your back to sitting on the side of a flat bed without using bedrails?: None Help needed moving to and from a bed to a chair (including a wheelchair)?: A Little Help needed standing up from a chair using your arms (e.g., wheelchair or bedside chair)?: A Little Help needed to walk in hospital room?: A Little Help needed climbing 3-5 steps with a railing? : A Little 6 Click Score: 20    End of Session Equipment Utilized During Treatment: Gait belt Activity Tolerance: Patient tolerated treatment well Patient left: in chair;with call bell/phone within reach   PT Visit Diagnosis: Unsteadiness on feet (R26.81)    Time: 0174-9449 PT Time Calculation (min) (ACUTE ONLY): 23 min   Charges:   PT Evaluation $PT Eval Low Complexity: 1 Low PT Treatments $Gait Training: 8-22 mins          Doreatha Massed, PT Acute Rehabilitation  Office: [phone] Pager: [phone]

## 2022-06-12 NOTE — Progress Notes (Signed)
1 Day Post-Op  Subjective: Barbara [name] is having less pain following stent insertion.  There was debris in the bladder and kidney at the time of stenting so ureteroscopy wasn't done and cultures were obtained.   Her Cr is stable at 2.47.  She  is afebrile.    ROS:  Review of Systems  Gastrointestinal:  Positive for abdominal pain and nausea.    Anti-infectives: Anti-infectives (From admission, onward)    Start     Dose/Rate Route Frequency Ordered Stop   06/11/22 0830  cefTRIAXone (ROCEPHIN) 2 g in sodium chloride 0.9 % 100 mL IVPB        2 g 200 mL/hr over 30 Minutes Intravenous  Once 06/11/22 0736 06/11/22 0926       Current Facility-Administered Medications  Medication Dose Route Frequency Provider Last Rate Last Admin   0.9 %  sodium chloride infusion  250 mL Intravenous PRN Orma Flaming, MD       0.9 %  sodium chloride infusion   Intravenous PRN Allie Bossier, MD 10 mL/hr at 06/11/22 0848 New Bag at 06/11/22 0848   acetaminophen (TYLENOL) tablet 650 mg  650 mg Oral Q6H PRN Orma Flaming, MD   650 mg at 06/10/22 1723   Or   acetaminophen (TYLENOL) suppository 650 mg  650 mg Rectal Q6H PRN Orma Flaming, MD       amiodarone (PACERONE) tablet 200 mg  200 mg Oral Daily Allie Bossier, MD   200 mg at 06/11/22 1962   hydrALAZINE (APRESOLINE) injection 10 mg  10 mg Intravenous Q6H PRN Allie Bossier, MD   10 mg at 06/10/22 1514   hydrALAZINE (APRESOLINE) tablet 50 mg  50 mg Oral BID Allie Bossier, MD   50 mg at 06/11/22 2115   HYDROmorphone (DILAUDID) injection 0.5-1 mg  0.5-1 mg Intravenous Q3H PRN Allie Bossier, MD   1 mg at 06/11/22 0836   isosorbide mononitrate (IMDUR) 24 hr tablet 30 mg  30 mg Oral Daily Allie Bossier, MD   30 mg at 06/11/22 0837   labetalol (NORMODYNE) injection 10 mg  10 mg Intravenous Q4H PRN Dwyane Dee, MD   10 mg at 06/12/22 0452   lip balm (CARMEX) ointment   Topical PRN Orma Flaming, MD   Given at 06/09/22 1643   metoprolol succinate  (TOPROL-XL) 24 hr tablet 25 mg  25 mg Oral QPM Allie Bossier, MD   25 mg at 06/11/22 1827   nitroGLYCERIN (NITROSTAT) SL tablet 0.4 mg  0.4 mg Sublingual Q5 min PRN Allie Bossier, MD       ondansetron [facility]) tablet 4 mg  4 mg Oral Q6H PRN Orma Flaming, MD       Or   ondansetron [facility]) injection 4 mg  4 mg Intravenous Q6H PRN Orma Flaming, MD   4 mg at 06/10/22 1429   pantoprazole (PROTONIX) EC tablet 40 mg  40 mg Oral q morning Allie Bossier, MD   40 mg at 06/11/22 2297   sodium chloride (OCEAN) 0.65 % nasal spray 1 spray  1 spray Each Nare PRN Allie Bossier, MD       sodium chloride flush (NS) 0.9 % injection 3 mL  3 mL Intravenous Q12H Orma Flaming, MD   3 mL at 06/11/22 2115   sodium chloride flush (NS) 0.9 % injection 3 mL  3 mL Intravenous PRN Orma Flaming, MD         Objective: Vital signs in  last 24 hours: Temp:  [97.9 F (36.6 C)-99.1 F (37.3 C)] 97.9 F (36.6 C) (08/09 0357) Pulse Rate:  [58-66] 60 (08/09 0535) Resp:  [14-20] 17 (08/09 0535) BP: (129-174)/(47-77) 152/56 (08/09 0535) SpO2:  [92 %-100 %] 94 % (08/09 0357) Weight:  [67 kg-79 kg] 67 kg (08/09 0434)  Intake/Output from previous day: 08/08 0701 - 08/09 0700 In: 667.9 [P.O.:120; I.V.:547.9] Out: 1200 [Urine:1200] Intake/Output this shift: Total I/O In: 120 [P.O.:120] Out: 600 [Urine:600]   Physical Exam Vitals reviewed.  Constitutional:      Appearance: Normal appearance.  Abdominal:     General: Abdomen is flat.     Palpations: Abdomen is soft.     Tenderness: There is abdominal tenderness (minimal LLQ).  Neurological:     Mental Status: She is alert.     Lab Results:  Recent Labs    06/11/22 0313 06/12/22 0348  WBC 10.3 7.2  HGB 11.2* 11.9*  HCT 36.1 37.4  PLT 204 218    BMET Recent Labs    06/11/22 0313 06/12/22 0348  NA 141 141  K 3.7 3.8  CL 103 102  CO2 28 27  GLUCOSE 111* 132*  BUN 36* 46*  CREATININE 2.49* 2.47*  CALCIUM 9.5 9.6    PT/INR No  results for input(s): "LABPROT", "INR" in the last 72 hours. ABG No results for input(s): "PHART", "HCO3" in the last 72 hours.  Invalid input(s): "PCO2", "PO2"  Studies/Results: DG C-Arm 1-60 Min-No Report  Result Date: 06/11/2022 Fluoroscopy was utilized by the requesting physician.  No radiographic interpretation.     Assessment and Plan: Left ureteral and renal stones.   She is improved following stenting.  Her Cr has stabilized.  Cultures from renal pelvis pending.  She will need ureteroscopy in 1-2 weeks.   Consider discharge in 24-48 hours if Cr remains stable or down and she is afebrile.      LOS: 3 days    Irine Seal 06/12/2022 [phone]  Patient ID: Barbara [name], female   DOB: Mar 09, 1941, 81 y.o.   MRN: [id number]

## 2022-06-13 DIAGNOSIS — N200 Calculus of kidney: Secondary | ICD-10-CM | POA: Diagnosis not present

## 2022-06-13 LAB — BASIC METABOLIC PANEL
Anion gap: 10 (ref 5–15)
BUN: 48 mg/dL — ABNORMAL HIGH (ref 8–23)
CO2: 26 mmol/L (ref 22–32)
Calcium: 8.8 mg/dL — ABNORMAL LOW (ref 8.9–10.3)
Chloride: 103 mmol/L (ref 98–111)
Creatinine, Ser: 1.94 mg/dL — ABNORMAL HIGH (ref 0.44–1.00)
GFR, Estimated: 25 mL/min — ABNORMAL LOW (ref >60)
Glucose, Bld: 123 mg/dL — ABNORMAL HIGH (ref 70–99)
Potassium: 4 mmol/L (ref 3.5–5.1)
Sodium: 139 mmol/L (ref 135–145)

## 2022-06-13 MED ORDER — HYDRALAZINE HCL 50 MG PO TABS
50.0000 mg | ORAL_TABLET | Freq: Three times a day (TID) | ORAL | Status: DC
Start: 2022-06-13 — End: 2022-06-14
  Administered 2022-06-13 – 2022-06-14 (×4): 50 mg via ORAL
  Filled 2022-06-13 (×4): qty 1

## 2022-06-13 MED ORDER — DOCUSATE SODIUM 100 MG PO CAPS
100.0000 mg | ORAL_CAPSULE | Freq: Two times a day (BID) | ORAL | Status: DC | PRN
Start: 2022-06-13 — End: 2022-06-14
  Administered 2022-06-13: 100 mg via ORAL
  Filled 2022-06-13: qty 1

## 2022-06-13 NOTE — Care Management Important Message (Signed)
Important Message  Patient Details IM Letter given to the Patient. Name: Barbara [name] MRN: [id number] Date of Birth: Dec 29, 1940   Medicare Important Message Given:  Yes     Kerin Salen 06/13/2022, 4:14 PM

## 2022-06-13 NOTE — Progress Notes (Signed)
Cardiac monitoring removed per MD order.  Angie Fava, RN

## 2022-06-13 NOTE — Progress Notes (Signed)
Pt's BP 179/72 at 0700, hydralazine given by nightshift RN. Rechecked at 0800, BP unchanged (177/75).  Denies dizzines, c/o slight headache "because I don't have my glasses."  Scheduled hydralazine given.  MD notified.  Will continue to monitor.  Angie Fava, RN

## 2022-06-13 NOTE — Progress Notes (Signed)
Mobility Specialist - Progress Note    06/13/22 1100  Mobility  Activity Ambulated with assistance in hallway;Transferred to/from [facility]  Level of Assistance Standby assist, set-up cues, supervision of patient - no hands on  Assistive Device Front wheel walker  Distance Ambulated (ft) 500 ft  Activity Response Tolerated well  $Mobility charge 1 Mobility   Pt requested to use BSC prior to ambulating in hall. No complaints made during session. Returned to bed at EOS and was left with all needs in reach.   [place] Specialist Acute Rehabilitation Services Phone: [phone] 06/13/22, 11:16 AM

## 2022-06-13 NOTE — TOC Progression Note (Addendum)
Transition of Care [facility]) - Progression Note    Patient Details  Name: Barbara [name] MRN: [id number] Date of Birth: January 19, 1941  [facility] CM/SW Contact  Leeroy Cha, RN Phone Number: 06/13/2022, 2:47 PM  Clinical Narrative:    Patient presented with hinn12 letter . Letter reviewed with patient and daily charges of 7700.26 dollars.  Patient refused to sign letter.  Copy of letter given to patient.   Expected Discharge Plan: Home/Self Care Barriers to Discharge: Continued Medical Work up  Expected Discharge Plan and Services Expected Discharge Plan: Home/Self Care   Discharge Planning Services: CM Consult   Living arrangements for the past 2 months: Single Family Home Expected Discharge Date: 06/13/22                                     Social Determinants of Health (SDOH) Interventions    Readmission Risk Interventions     No data to display

## 2022-06-13 NOTE — TOC Progression Note (Addendum)
Transition of Care [facility]) - Progression Note    Patient Details  Name: Barbara [name] MRN: [id number] Date of Birth: 01/13/1941  [facility]) CM/SW Contact  Leeroy Cha, RN Phone Number: 06/13/2022, 11:39 AM  Clinical Narrative:    Patient to be dcd today.  States that she will not have aride until tomorrow.  Informed her that medicare and uhc will not pay for a night for her to just stay in the hospital without it being medical necessary.  TCT-son Billy Coast is in [place] and will have to find someone to come and get her. He will call back once that it found. Tct-safe transport/unable to assist with ambulation or getting the passenger into the house. Per the patient she also has steps going into the bach of the house. Went to speak directly with patient and she has appealed the [employer] through [employer].   Text sent to [facility] agency that she has recently used for pt and ot for home services. Expected Discharge Plan: Home/Self Care Barriers to Discharge: Continued Medical Work up  Expected Discharge Plan and Services Expected Discharge Plan: Home/Self Care   Discharge Planning Services: CM Consult   Living arrangements for the past 2 months: Single Family Home                                       Social Determinants of Health (SDOH) Interventions    Readmission Risk Interventions     No data to display

## 2022-06-13 NOTE — Progress Notes (Signed)
  PROGRESS NOTE  Patient medically stable for discharge today. Her Cr is improving after getting stent placed. She has been ambulatory in hallway and home health has been ordered. Patient concerned about going home today, she was apparently told that she would stay until Friday. However, medically she has made improvements and no longer needs in hospital care. I spoke with son over the phone, he is currently in [place]. He was very angry and inappropriate during our conversation, stating that patient has no ride home and he is refusing taxi/bus ride home for patient. I updated him that patient's kidney function has improved, to which he responded "then make her not better today." Extensive conversation with TOC and RN today, but with her medical improvement, she is stable for discharge home and family to arrange for transportation. Charge RN and [facility] aware of situation.    Dessa Phi, DO [facility] 06/13/2022, 12:02 PM  Available via Epic secure chat 7am-7pm After these hours, please refer to coverage provider listed on [email or web address]

## 2022-06-13 NOTE — Discharge Summary (Addendum)
Physician Discharge Summary  PATRICE [name] WFU:[id number] DOB: [birth year] DOA: 06/09/2022  PCP: Laurey Morale, MD  Admit date: 06/09/2022 Discharge date: 06/13/2022  Admitted From: Home Disposition:  Home with home health   Recommendations for Outpatient Follow-up:  Follow up with Urology   Discharge Condition: Stable CODE STATUS: Full  Diet recommendation: Heart healthy   Brief/Interim Summary: Barbara [name] is an 81 y.o. 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    Presented to ED with complaints of LLQ pain that started around 4AM. She thought she needed to have a BM, but after that she still had the pain. Pain rated as a 10/10 and described as stabbing. Pain constant. No radiation. Nothing made it better or worse. Pain was so bad she called 911. CT revealed left ureteral stones, 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, to 5 mm in size, leading to moderate left hydroureteronephrosis and significant left perinephric stranding/edema. Urology was consulted.  Patient underwent cystoscopy with left ureteral stent placement by Dr. Gloriann Loan 8/8.  Patient's kidney function continued to improve.  Urine culture remained negative.  Patient worked with physical therapy for ambulation prior to discharge home.  She will need to follow-up with urology as outpatient for ureteroscopy in 1 to 2 weeks.   Discharge Diagnoses:  Principal Problem:   Nephrolithiasis, 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, 89m in size, leading to moderate left hydroureteronephrosis and significant left perinephric stranding /edema Active Problems:   Acute respiratory failure with hypoxia (HCC)   Acute on chronic diastolic CHF (congestive heart failure) (HCC)   CAD (coronary artery disease/elevated troponin   CKD (chronic kidney disease), stage III (HCC)   PAF (paroxysmal atrial fibrillation) (HCC)   Essential hypertension    Hyperlipidemia   Gastroesophageal reflux disease   Tachycardia-bradycardia syndrome (HCC)   OSA (obstructive sleep apnea)   Left renal stone with hydronephrosis  -S/p ureteral stent placement 8/8 -Will need ureteroscopy in 1-2 weeks  -Urine culture from the OR negative    Acute respiratory failure with hypoxia secondary to acute on chronic diastolic CHF  -CXR showed cardiomegaly with pulmonary edema -Continue hydralazine, Imdur, metoprolol -Now on room air   AKI on CKD stage IIIb -Baseline Cr 1.6 -Continues to improve    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 -Amiodarone   CAD, recent NSTEMI -Aspirin    Paroxysmal A-fib -Eliquis   HLD -Crestor   Demand ischemia -Secondary to above  Discharge Instructions  Discharge Instructions     Call MD for:  difficulty breathing, headache or visual disturbances   Complete by: As directed    Call MD for:  extreme fatigue   Complete by: As directed    Call MD for:  hives   Complete by: As directed    Call MD for:  persistant dizziness or light-headedness   Complete by: As directed    Call MD for:  persistant nausea and vomiting   Complete by: As directed    Call MD for:  severe uncontrolled pain   Complete by: As directed    Call MD for:  temperature >100.4   Complete by: As directed    Diet - low sodium heart healthy   Complete by: As directed    Discharge instructions   Complete by: As directed    You were cared for by a hospitalist during your hospital stay. If you have any questions about  your discharge medications or the care you received while you were in the hospital after you are discharged, you can call the unit and ask to speak with the hospitalist on call if the hospitalist that took care of you is not available. Once you are discharged, your primary care physician will handle any further medical issues. Please note that NO REFILLS for any discharge medications will be  authorized once you are discharged, as it is imperative that you return to your primary care physician (or establish a relationship with a primary care physician if you do not have one) for your aftercare needs so that they can reassess your need for medications and monitor your lab values.   Increase activity slowly   Complete by: As directed       Allergies as of 06/14/2022       Reactions   Lidocaine Anaphylaxis   Morphine Other (See Comments)   "Body shuts down"   Procaine Hcl Anaphylaxis, Rash, Other (See Comments)   "Anything with 'caine' in it "   Sulfonamide Derivatives Hives   Norco [hydrocodone-acetaminophen] Other (See Comments)   "Made me sick"   Tramadol Nausea Only   Vytorin [ezetimibe-simvastatin] Other (See Comments)   Unknown   Tape Itching, Other (See Comments)   Patient prefers either paper tape or Coban wrap        Medication List     TAKE these medications    acetaminophen 500 MG tablet Commonly known as: TYLENOL Take 500 mg by mouth daily as needed for headache.   amiodarone 200 MG tablet Commonly known as: PACERONE Take 1 tablet (200 mg total) by mouth daily.   amLODipine 5 MG tablet Commonly known as: NORVASC Take 1 tablet (5 mg total) by mouth daily.   apixaban 2.5 MG Tabs tablet Commonly known as: Eliquis Take 1 tablet (2.5 mg total) by mouth 2 (two) times daily.   Aspirin Low Dose 81 MG chewable tablet Generic drug: aspirin Chew 1 tablet (81 mg total) by mouth daily.   dextromethorphan 30 MG/5ML liquid Commonly known as: DELSYM Take 15 mg by mouth daily as needed for cough.   docusate sodium 100 MG capsule Commonly known as: Colace Take 1 capsule (100 mg total) by mouth 2 (two) times daily. What changed:  when to take this reasons to take this   hydrALAZINE 50 MG tablet Commonly known as: APRESOLINE Take 1 tablet (50 mg total) by mouth every 8 (eight) hours. What changed: when to take this   ipratropium 0.06 % nasal  spray Commonly known as: ATROVENT Place 2 sprays into both nostrils 2 (two) times daily as needed (allergies).   isosorbide mononitrate 30 MG 24 hr tablet Commonly known as: IMDUR Take 1 tablet (30 mg total) by mouth daily.   ketoconazole 2 % cream Commonly known as: NIZORAL Apply 1 application topically 2 (two) times daily as needed for irritation.   ketotifen 0.025 % ophthalmic solution Commonly known as: ZADITOR Place 1 drop into the right eye daily as needed (dry eyes).   loratadine 10 MG tablet Commonly known as: CLARITIN Take 10 mg by mouth daily as needed for allergies.   LORazepam 0.5 MG tablet Commonly known as: ATIVAN TAKE 1 TABLET BY MOUTH EVERY 6 HOURS AS NEEDED FOR ANXIETY What changed:  when to take this additional instructions   metoprolol succinate 25 MG 24 hr tablet Commonly known as: TOPROL-XL Take 1 tablet (25 mg total) by mouth daily. What changed: when to take this  multivitamin with minerals Tabs tablet Take 1 tablet by mouth daily.   nitroGLYCERIN 0.4 MG SL tablet Commonly known as: NITROSTAT DISSOLVE ONE TABLET UNDER THE TONGUE EVERY 5 MINUTES AS NEEDED FOR CHEST PAIN.  DO NOT EXCEED A TOTAL OF 3 DOSES IN 15 MINUTES What changed: See the new instructions.   pantoprazole 40 MG tablet Commonly known as: PROTONIX Take 1 tablet (40 mg total) by mouth daily. What changed: when to take this   polyethylene glycol powder 17 GM/SCOOP powder Commonly known as: GLYCOLAX/MIRALAX Take 17 g by mouth daily. What changed:  when to take this reasons to take this   PRESCRIPTION MEDICATION See admin instructions. CPAP- At bedtime   PROBIOTIC GUMMIES PO Take 2 tablets by mouth every morning.   rosuvastatin 40 MG tablet Commonly known as: CRESTOR Take 1 tablet (40 mg total) by mouth daily. What changed: when to take this   triamcinolone 55 MCG/ACT nasal inhaler Commonly known as: NASACORT Place 1 spray into both nostrils daily as needed (for  allergies).   Vitamin D3 50 MCG (2000 UT) Tabs Take 2,000 Units by mouth daily.        Follow-up Information     Marton Redwood III, MD Follow up in 2 week(s).   Specialty: Urology Contact information: [place] [id number] [phone]                Allergies  Allergen Reactions   Lidocaine Anaphylaxis   Morphine Other (See Comments)    "Body shuts down"   Procaine Hcl Anaphylaxis, Rash and Other (See Comments)    "Anything with 'caine' in it "   Sulfonamide Derivatives Hives   Norco [Hydrocodone-Acetaminophen] Other (See Comments)    "Made me sick"   Tramadol Nausea Only   Vytorin [Ezetimibe-Simvastatin] Other (See Comments)    Unknown   Tape Itching and Other (See Comments)    Patient prefers either paper tape or Coban wrap    Consultations: Urology    Procedures/Studies: DG C-Arm 1-60 Min-No Report  Result Date: 06/11/2022 Fluoroscopy was utilized by the requesting physician.  No radiographic interpretation.   CT Abdomen Pelvis W Contrast  Result Date: 06/09/2022 CLINICAL DATA:  Left lower quadrant abdominal pain with frequent bowel movements since early this morning. EXAM: CT ABDOMEN AND PELVIS WITH CONTRAST TECHNIQUE: Multidetector CT imaging of the abdomen and pelvis was performed using the standard protocol following bolus administration of intravenous contrast. RADIATION DOSE REDUCTION: This exam was performed according to the departmental dose-optimization program which includes automated exposure control, adjustment of the mA and/or kV according to patient size and/or use of iterative reconstruction technique. CONTRAST:  70m OMNIPAQUE IOHEXOL 300 MG/ML  SOLN COMPARISON:  Current chest radiograph. FINDINGS: Lower chest: Patchy ground-glass and confluent opacities lung bases associated with small effusions. Hepatobiliary: No focal liver abnormality is seen. No gallstones, gallbladder wall thickening, or biliary dilatation. Pancreas:  Unremarkable. No pancreatic ductal dilatation or surrounding inflammatory changes. Spleen: Normal in size without focal abnormality. Adrenals/Urinary Tract: No adrenal masses. Moderate left hydronephrosis with significant left perinephric stranding/edema. Left ureter is dilated. There is a small stone in the mid ureter, and a second stone or stones in the distal ureter just below the pelvic brim, 5-6 mm in size. No right hydronephrosis. Nonobstructing stones noted in the left kidney, mostly in the lower pole. There are bilateral low-attenuation renal masses, largest on the right, mid to upper pole, 3.3 cm, all consistent with cysts. No follow-up recommended. Normal  bladder. Stomach/Bowel: Stomach is within normal limits. Appendix appears normal. No evidence of bowel wall thickening, distention, or inflammatory changes. Vascular/Lymphatic: Aortic atherosclerosis. No aneurysm. No enlarged lymph nodes. Reproductive: Status post hysterectomy. No adnexal masses. Other: None. Musculoskeletal: No fracture or acute finding.  No bone lesion. IMPRESSION: 1. Left ureteral stones, 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, to 5 mm in size, leading to moderate left hydroureteronephrosis and significant left perinephric stranding/edema. 2. No other acute abnormality within the abdomen or pelvis. 3. Nonobstructing left intrarenal stones. 4. Small effusions with lung base opacities, which may reflect pulmonary edema. Consider infection if there are consistent clinical findings. 5. Aortic atherosclerosis. Electronically Signed   By: Lajean Manes M.D.   On: 06/09/2022 13:21   DG Chest Portable 1 View  Result Date: 06/09/2022 CLINICAL DATA:  Hypoxia. EXAM: PORTABLE CHEST 1 VIEW COMPARISON:  05/09/2022 FINDINGS: 1142 hours. Rightward patient rotation. Or cardiomegaly diffuse interstitial and alveolar opacity is new in the interval with a slight parahilar predominance. Present pacemaker again noted. Telemetry leads overlie the chest. IMPRESSION:  Cardiomegaly with pulmonary edema. Electronically Signed   By: Misty Stanley M.D.   On: 06/09/2022 12:03   CUP PACEART INCLINIC DEVICE CHECK  Result Date: 05/22/2022 Wound check appointment. Steri-strips removed. Wound without redness or edema. Incision edges approximated, wound well healed. Normal device function. Thresholds, sensing, and impedances consistent with implant measurements. Device programmed at 3.5V/auto capture programmed on for extra safety margin until 3 month visit. Histogram distribution appropriate for patient and level of activity. No mode switches or high ventricular rates noted. Patient educated about wound care, arm mobility, lifting restrictions. ROV in 3 months with Dr. Curt Bears      Discharge Exam: Vitals:   06/13/22 2200 06/14/22 0544  BP: (!) 163/58 (!) 175/71  Pulse: 60 (!) 59  Resp: 16 16  Temp: 98.6 F (37 C) 98.9 F (37.2 C)  SpO2: 97% 95%     General: Pt is alert, awake, not in acute distress Cardiovascular: RRR, S1/S2 +, no edema Respiratory: CTA bilaterally, no wheezing, no rhonchi, no respiratory distress, no conversational dyspnea  Abdominal: Soft, NT, ND, bowel sounds + Extremities: no edema, no cyanosis Psych: Normal mood and affect, stable judgement and insight     The results of significant diagnostics from this hospitalization (including imaging, microbiology, ancillary and laboratory) are listed below for reference.     Microbiology: Recent Results (from the past 240 hour(s))  Urine Culture     Status: Abnormal   Collection Time: 06/09/22  2:26 PM   Specimen: Urine, Clean Catch  Result Value Ref Range Status   Specimen Description URINE, CLEAN CATCH  Final   Special Requests   Final    Normal Performed at [facility], [address], [place], [place] [address]    Culture MULTIPLE SPECIES PRESENT, SUGGEST RECOLLECTION (A)  Final   Report Status 06/10/2022 FINAL  Final  SARS Coronavirus 2 by RT PCR (hospital order,  performed in [facility] hospital lab) *cepheid single result test* Anterior Nasal Swab     Status: None   Collection Time: 06/10/22  7:44 AM   Specimen: Anterior Nasal Swab  Result Value Ref Range Status   SARS Coronavirus 2 by RT PCR NEGATIVE NEGATIVE Final    Comment: (NOTE) SARS-CoV-2 target nucleic acids are NOT DETECTED.  The SARS-CoV-2 RNA is generally detectable in upper and lower respiratory specimens during the acute phase of infection. The lowest concentration of SARS-CoV-2 viral copies this assay  can detect is 250 copies / mL. A negative result does not preclude SARS-CoV-2 infection and should not be used as the sole basis for treatment or other patient management decisions.  A negative result may occur with improper specimen collection / handling, submission of specimen other than nasopharyngeal swab, presence of viral mutation(s) within the areas targeted by this assay, and inadequate number of viral copies (<250 copies / mL). A negative result must be combined with clinical observations, patient history, and epidemiological information.  Fact Sheet for Patients:   https://www.patel.info/  Fact Sheet for Healthcare Providers: https://hall.com/  This test is not yet approved or  cleared by the [place] FDA and has been authorized for detection and/or diagnosis of SARS-CoV-2 by FDA under an Emergency Use Authorization (EUA).  This EUA will remain in effect (meaning this test can be used) for the duration of the COVID-19 declaration under Section 564(b)(1) of the Act, 21 U.S.C. section 360bbb-3(b)(1), unless the authorization is terminated or revoked sooner.  Performed at [facility], [place] [address] [place], [place] [address]   Culture, blood (Routine X 2) w Reflex to ID Panel     Status: None (Preliminary result)   Collection Time: 06/10/22  8:23 AM   Specimen: BLOOD  Result Value Ref Range Status    Specimen Description   Final    BLOOD BLOOD LEFT HAND Performed at [place] [address], [place], [place] [address]    Special Requests   Final    BOTTLES DRAWN AEROBIC ONLY Blood Culture adequate volume Performed at [place] [address], [place], [place] [address]    Culture   Final    NO GROWTH 4 DAYS Performed at [facility], [place] [address], [place], [place] [address]    Report Status PENDING  Incomplete  Culture, blood (Routine X 2) w Reflex to ID Panel     Status: None (Preliminary result)   Collection Time: 06/10/22  8:29 AM   Specimen: BLOOD  Result Value Ref Range Status   Specimen Description   Final    BLOOD BLOOD RIGHT HAND Performed at [place] [address], [place], [place] [address]    Special Requests   Final    BOTTLES DRAWN AEROBIC ONLY Blood Culture adequate volume Performed at [place] [address] [place], [place] [address]    Culture   Final    NO GROWTH 4 DAYS Performed at [facility], [place] [address] [place], [place] [address]    Report Status PENDING  Incomplete  Urine Culture     Status: None   Collection Time: 06/11/22  5:19 PM   Specimen: Urine, Cystoscope  Result Value Ref Range Status   Specimen Description   Final    CYSTOSCOPY Performed at [place] [address], [place], [place] [address]    Special Requests   Final    NONE Performed at [facility], [place] [address] [place], [place] [address]    Culture   Final    NO GROWTH Performed at [facility], [place] [address] [place], [place] [address]    Report Status 06/12/2022 FINAL  Final  Urine Culture     Status: None   Collection Time: 06/11/22  5:21 PM   Specimen: Urine, Cystoscope  Result Value Ref Range Status   Specimen Description   Final    CYSTOSCOPY Performed  at [facility], [place]  [address], [place], [place] [address]    Special Requests   Final    NONE Performed at [facility], [place] [address] [place], [place] [address]    Culture   Final    NO GROWTH Performed at [facility], [place] [address], [place], [place] [address]    Report Status 06/12/2022 FINAL  Final     Labs: BNP (last 3 results) Recent Labs    05/07/22 0101 05/08/22 0135 06/09/22 1103  BNP 636.3* 520.4* 017.7*   Basic Metabolic Panel: Recent Labs  Lab 06/09/22 1103 06/09/22 1405 06/10/22 0301 06/11/22 0313 06/12/22 0348 06/13/22 0335 06/14/22 0803  NA  --   --  141 141 141 139 142  K  --   --  3.8 3.7 3.8 4.0 4.1  CL  --   --  107 103 102 103 110  CO2  --   --  '25 28 27 26 25  '$ GLUCOSE  --   --  124* 111* 132* 123* 125*  BUN  --   --  27* 36* 46* 48* 36*  CREATININE  --   --  2.03* 2.49* 2.47* 1.94* 1.47*  CALCIUM  --   --  9.6 9.5 9.6 8.8* 9.5  MG 2.0 1.9  --  2.1 2.1  --   --   PHOS  --   --   --  3.7 3.4  --   --    Liver Function Tests: Recent Labs  Lab 06/09/22 1049 06/11/22 0313 06/12/22 0348  AST '24 21 19  '$ ALT '18 15 15  '$ ALKPHOS 85 64 67  BILITOT 0.7 1.1 0.8  PROT 7.4 6.9 7.4  ALBUMIN 3.8 3.6 3.5   Recent Labs  Lab 06/09/22 1049  LIPASE 32   No results for input(s): "AMMONIA" in the last 168 hours. CBC: Recent Labs  Lab 06/09/22 1049 06/10/22 0301 06/11/22 0313 06/12/22 0348  WBC 11.0* 11.1* 10.3 7.2  NEUTROABS 9.7*  --  8.6* 6.5  HGB 12.7 11.7* 11.2* 11.9*  HCT 39.4 37.8 36.1 37.4  MCV 90.4 92.0 92.1 92.3  PLT 213 210 204 218   Cardiac Enzymes: No results for input(s): "CKTOTAL", "CKMB", "CKMBINDEX", "TROPONINI" in the last 168 hours. BNP: Invalid input(s): "POCBNP" CBG: No results for input(s): "GLUCAP" in the last 168 hours. D-Dimer No results for input(s): "DDIMER" in the last 72 hours. Hgb A1c No results for input(s): "HGBA1C" in the last 72 hours. Lipid Profile No results for input(s): "CHOL", "HDL",  "LDLCALC", "TRIG", "CHOLHDL", "LDLDIRECT" in the last 72 hours. Thyroid function studies No results for input(s): "TSH", "T4TOTAL", "T3FREE", "THYROIDAB" in the last 72 hours.  Invalid input(s): "FREET3" Anemia work up No results for input(s): "VITAMINB12", "FOLATE", "FERRITIN", "TIBC", "IRON", "RETICCTPCT" in the last 72 hours. Urinalysis    Component Value Date/Time   COLORURINE YELLOW 06/09/2022 1426   APPEARANCEUR CLOUDY (A) 06/09/2022 1426   LABSPEC 1.010 06/09/2022 1426   PHURINE 6.5 06/09/2022 1426   GLUCOSEU NEGATIVE 06/09/2022 1426   GLUCOSEU NEGATIVE 09/13/2021 1203   HGBUR LARGE (A) 06/09/2022 1426   HGBUR negative 10/17/2010 0859   BILIRUBINUR NEGATIVE 06/09/2022 1426   BILIRUBINUR neg 08/01/2021 1037   KETONESUR NEGATIVE 06/09/2022 1426   PROTEINUR NEGATIVE 06/09/2022 1426   UROBILINOGEN 0.2 09/13/2021 1203   NITRITE NEGATIVE 06/09/2022 1426   LEUKOCYTESUR TRACE (A) 06/09/2022 1426   Sepsis Labs Recent Labs  Lab 06/09/22 1049 06/10/22 0301  06/11/22 0313 06/12/22 0348  WBC 11.0* 11.1* 10.3 7.2   Microbiology Recent Results (from the past 240 hour(s))  Urine Culture     Status: Abnormal   Collection Time: 06/09/22  2:26 PM   Specimen: Urine, Clean Catch  Result Value Ref Range Status   Specimen Description URINE, CLEAN CATCH  Final   Special Requests   Final    Normal Performed at [facility], [place] [address], [place], [place] [address]    Culture MULTIPLE SPECIES PRESENT, SUGGEST RECOLLECTION (A)  Final   Report Status 06/10/2022 FINAL  Final  SARS Coronavirus 2 by RT PCR (hospital order, performed in [facility] hospital lab) *cepheid single result test* Anterior Nasal Swab     Status: None   Collection Time: 06/10/22  7:44 AM   Specimen: Anterior Nasal Swab  Result Value Ref Range Status   SARS Coronavirus 2 by RT PCR NEGATIVE NEGATIVE Final    Comment: (NOTE) SARS-CoV-2 target nucleic acids are NOT DETECTED.  The SARS-CoV-2 RNA is generally  detectable in upper and lower respiratory specimens during the acute phase of infection. The lowest concentration of SARS-CoV-2 viral copies this assay can detect is 250 copies / mL. A negative result does not preclude SARS-CoV-2 infection and should not be used as the sole basis for treatment or other patient management decisions.  A negative result may occur with improper specimen collection / handling, submission of specimen other than nasopharyngeal swab, presence of viral mutation(s) within the areas targeted by this assay, and inadequate number of viral copies (<250 copies / mL). A negative result must be combined with clinical observations, patient history, and epidemiological information.  Fact Sheet for Patients:   https://www.patel.info/  Fact Sheet for Healthcare Providers: https://hall.com/  This test is not yet approved or  cleared by the [place] FDA and has been authorized for detection and/or diagnosis of SARS-CoV-2 by FDA under an Emergency Use Authorization (EUA).  This EUA will remain in effect (meaning this test can be used) for the duration of the COVID-19 declaration under Section 564(b)(1) of the Act, 21 U.S.C. section 360bbb-3(b)(1), unless the authorization is terminated or revoked sooner.  Performed at [facility], [place] [address], [place], [place] [address]   Culture, blood (Routine X 2) w Reflex to ID Panel     Status: None (Preliminary result)   Collection Time: 06/10/22  8:23 AM   Specimen: BLOOD  Result Value Ref Range Status   Specimen Description   Final    BLOOD BLOOD LEFT HAND Performed at [place] [address] [place], [place] [address]    Special Requests   Final    BOTTLES DRAWN AEROBIC ONLY Blood Culture adequate volume Performed at [place] [address] [place], [place] [address]    Culture   Final    NO GROWTH 4  DAYS Performed at [facility], [place] [address], [place], [place] [address]    Report Status PENDING  Incomplete  Culture, blood (Routine X 2) w Reflex to ID Panel     Status: None (Preliminary result)   Collection Time: 06/10/22  8:29 AM   Specimen: BLOOD  Result Value Ref Range Status   Specimen Description   Final    BLOOD BLOOD RIGHT HAND Performed at [place] [address] [place], [place] [address]    Special Requests   Final    BOTTLES DRAWN AEROBIC ONLY Blood  Culture adequate volume Performed at [place] [address] [place], [place] [address]    Culture   Final    NO GROWTH 4 DAYS Performed at [facility], [place] [address] [place], [place] [address]    Report Status PENDING  Incomplete  Urine Culture     Status: None   Collection Time: 06/11/22  5:19 PM   Specimen: Urine, Cystoscope  Result Value Ref Range Status   Specimen Description   Final    CYSTOSCOPY Performed at [place] [address], [place], [place] [address]    Special Requests   Final    NONE Performed at [facility], [place] [address] [place], [place] [address]    Culture   Final    NO GROWTH Performed at [facility], [place] [address], [place], [place] [address]    Report Status 06/12/2022 FINAL  Final  Urine Culture     Status: None   Collection Time: 06/11/22  5:21 PM   Specimen: Urine, Cystoscope  Result Value Ref Range Status   Specimen Description   Final    CYSTOSCOPY Performed at [facility] [address] [place], [place] [address]    Special Requests   Final    NONE Performed at [facility], [place] [address] [place], [place] [address]    Culture   Final    NO GROWTH Performed at [facility], [place] [address], [place], [place] [address]    Report Status 06/12/2022 FINAL  Final     Patient was seen and examined on the day of  discharge and was found to be in stable condition. Time coordinating discharge: 40 minutes including assessment and coordination of care, as well as examination of the patient.   SIGNED:  Dessa Phi, DO [facility] 06/14/2022, 10:54 AM

## 2022-06-14 LAB — BASIC METABOLIC PANEL
Anion gap: 7 (ref 5–15)
BUN: 36 mg/dL — ABNORMAL HIGH (ref 8–23)
CO2: 25 mmol/L (ref 22–32)
Calcium: 9.5 mg/dL (ref 8.9–10.3)
Chloride: 110 mmol/L (ref 98–111)
Creatinine, Ser: 1.47 mg/dL — ABNORMAL HIGH (ref 0.44–1.00)
GFR, Estimated: 35 mL/min — ABNORMAL LOW (ref >60)
Glucose, Bld: 125 mg/dL — ABNORMAL HIGH (ref 70–99)
Potassium: 4.1 mmol/L (ref 3.5–5.1)
Sodium: 142 mmol/L (ref 135–145)

## 2022-06-14 MED ORDER — HYDROCHLOROTHIAZIDE 25 MG PO TABS: 25.0000 mg | ORAL_TABLET | Freq: Every day | ORAL | 1 refills | Status: DC

## 2022-06-14 MED ORDER — AMLODIPINE BESYLATE 5 MG PO TABS: 5.0000 mg | ORAL_TABLET | Freq: Every day | ORAL | 1 refills | Status: DC

## 2022-06-14 MED ORDER — HYDROCHLOROTHIAZIDE 25 MG PO TABS: 25.0000 mg | ORAL_TABLET | Freq: Every day | ORAL | Status: DC

## 2022-06-14 MED ORDER — AMLODIPINE BESYLATE 5 MG PO TABS
5.0000 mg | ORAL_TABLET | Freq: Every day | ORAL | Status: DC
  Administered 2022-06-14: 5 mg via ORAL
  Filled 2022-06-14: qty 1

## 2022-06-14 NOTE — Progress Notes (Signed)
Physical Therapy Treatment Patient Details Name: Barbara [name] MRN: [id number] DOB: 01-19-1941 Today's Date: 06/14/2022   History of Present Illness 81 yo female admitted with nephrolithiasis. S/P cystoscopy and L ureteral stent placement 8/8. Hx of NSTEMI, pacemaker, Afib, CHF, CAD, CKD, R rev TSA 2021    PT Comments    Patient resting in bed on arrival and ready to mobilize. Pt pleasant and eager to go home today. She is now mod independent for bed mobility and transfers and was able to advance gait training with use of SPC. Patient ambulated ~400' with no LOB and safe cane sequencing. EOS resting in recliner with RN in room for morning meds. Will continue to progress pt as able prior to return home with HHPT.     Recommendations for follow up therapy are one component of a multi-disciplinary discharge planning process, led by the attending physician.  Recommendations may be updated based on patient status, additional functional criteria and insurance authorization.  Follow Up Recommendations  Home health PT (resume HHPT visits -pt reports they have been coming 1x/week)     Assistance Recommended at Discharge PRN  Patient can return home with the following A little help with walking and/or transfers;A little help with bathing/dressing/bathroom;Assist for transportation;Help with stairs or ramp for entrance;Assistance with cooking/housework   Equipment Recommendations  None recommended by PT    Recommendations for Other Services       Precautions / Restrictions Precautions Precautions: Fall Restrictions Weight Bearing Restrictions: No     Mobility  Bed Mobility Overal bed mobility: Modified Independent             General bed mobility comments: HOB slightly elevated, pt taking some extra time    Transfers Overall transfer level: Modified independent Equipment used: Rolling walker (2 wheels) Transfers: Sit to/from Stand             General transfer  comment: Increased time and use of bil UE to power up from EOB and toilet with use of grab bars    Ambulation/Gait Ambulation/Gait assistance: Supervision Gait Distance (Feet): 400 Feet Assistive device: Straight cane Gait Pattern/deviations: Step-through pattern, Decreased stride length Gait velocity: decr     General Gait Details: sup for safety, pt manage SPC sequencing well with no overt LOB   Stairs             Wheelchair Mobility    Modified Rankin (Stroke Patients Only)       Balance Overall balance assessment: Mild deficits observed, not formally tested                                          Cognition Arousal/Alertness: Awake/alert Behavior During Therapy: WFL for tasks assessed/performed Overall Cognitive Status: Within Functional Limits for tasks assessed                                          Exercises      General Comments        Pertinent Vitals/Pain Pain Assessment Pain Assessment: No/denies pain    Home Living                          Prior Function  PT Goals (current goals can now be found in the care plan section) Acute Rehab PT Goals Patient Stated Goal: home soon. PT Goal Formulation: With patient Time For Goal Achievement: 06/26/22 Potential to Achieve Goals: Good Progress towards PT goals: Progressing toward goals    Frequency    Min 3X/week      PT Plan Current plan remains appropriate    Co-evaluation              AM-PAC PT "6 Clicks" Mobility   Outcome Measure  Help needed turning from your back to your side while in a flat bed without using bedrails?: None Help needed moving from lying on your back to sitting on the side of a flat bed without using bedrails?: None Help needed moving to and from a bed to a chair (including a wheelchair)?: None Help needed standing up from a chair using your arms (e.g., wheelchair or bedside chair)?: None Help  needed to walk in hospital room?: A Little Help needed climbing 3-5 steps with a railing? : A Little 6 Click Score: 22    End of Session Equipment Utilized During Treatment: Gait belt Activity Tolerance: Patient tolerated treatment well Patient left: in chair;with call bell/phone within reach;with chair alarm set Nurse Communication: Mobility status PT Visit Diagnosis: Unsteadiness on feet (R26.81)     Time: 4098-1191 PT Time Calculation (min) (ACUTE ONLY): 25 min  Charges:  $Gait Training: 8-22 mins $Therapeutic Activity: 8-22 mins                     Verner Mould, DPT Acute Rehabilitation Services Office [phone] Pager [phone]  06/14/22 10:38 AM

## 2022-06-14 NOTE — Progress Notes (Signed)
3 Days Post-Op  Subjective: Barbara [name] is doing well without pain or voiding complaints.   Her Cr is down to 1.47.  She  is afebrile.    ROS:  Review of Systems  Constitutional:  Negative for fever.    Anti-infectives: Anti-infectives (From admission, onward)    Start     Dose/Rate Route Frequency Ordered Stop   06/12/22 1600  cefadroxil (DURICEF) capsule 500 mg        500 mg Oral Every 48 hours 06/12/22 1450 06/16/22 1559   06/11/22 0830  cefTRIAXone (ROCEPHIN) 2 g in sodium chloride 0.9 % 100 mL IVPB        2 g 200 mL/hr over 30 Minutes Intravenous  Once 06/11/22 0736 06/11/22 0926       Current Facility-Administered Medications  Medication Dose Route Frequency Provider Last Rate Last Admin   0.9 %  sodium chloride infusion  250 mL Intravenous PRN Orma Flaming, MD       0.9 %  sodium chloride infusion   Intravenous PRN Allie Bossier, MD 10 mL/hr at 06/11/22 0848 New Bag at 06/11/22 0848   acetaminophen (TYLENOL) tablet 650 mg  650 mg Oral Q6H PRN Orma Flaming, MD   650 mg at 06/10/22 1723   Or   acetaminophen (TYLENOL) suppository 650 mg  650 mg Rectal Q6H PRN Orma Flaming, MD       amiodarone (PACERONE) tablet 200 mg  200 mg Oral Daily Allie Bossier, MD   200 mg at 06/14/22 0914   amLODipine (NORVASC) tablet 5 mg  5 mg Oral Daily Dessa Phi, DO   5 mg at 06/14/22 0914   apixaban (ELIQUIS) tablet 2.5 mg  2.5 mg Oral BID Dessa Phi, DO   2.5 mg at 06/14/22 0914   cefadroxil (DURICEF) capsule 500 mg  500 mg Oral Q48H Irine Seal, MD   500 mg at 06/12/22 1658   docusate sodium (COLACE) capsule 100 mg  100 mg Oral BID PRN Dessa Phi, DO   100 mg at 06/13/22 1147   hydrALAZINE (APRESOLINE) injection 10 mg  10 mg Intravenous Q6H PRN Allie Bossier, MD   10 mg at 06/13/22 9381   hydrALAZINE (APRESOLINE) tablet 50 mg  50 mg Oral Q8H Dessa Phi, DO   50 mg at 06/14/22 0553   HYDROmorphone (DILAUDID) injection 0.5-1 mg  0.5-1 mg Intravenous Q3H PRN Allie Bossier, MD   1 mg at 06/11/22 0836   isosorbide mononitrate (IMDUR) 24 hr tablet 30 mg  30 mg Oral Daily Allie Bossier, MD   30 mg at 06/14/22 0915   labetalol (NORMODYNE) injection 10 mg  10 mg Intravenous Q4H PRN Dwyane Dee, MD   10 mg at 06/12/22 0452   lip balm (CARMEX) ointment   Topical PRN Orma Flaming, MD   Given at 06/09/22 1643   metoprolol succinate (TOPROL-XL) 24 hr tablet 25 mg  25 mg Oral QPM Allie Bossier, MD   25 mg at 06/13/22 1740   nitroGLYCERIN (NITROSTAT) SL tablet 0.4 mg  0.4 mg Sublingual Q5 min PRN Allie Bossier, MD       ondansetron [facility]) tablet 4 mg  4 mg Oral Q6H PRN Orma Flaming, MD       Or   ondansetron [facility]) injection 4 mg  4 mg Intravenous Q6H PRN Orma Flaming, MD   4 mg at 06/10/22 1429   pantoprazole (PROTONIX) EC tablet 40 mg  40 mg Oral q morning Sherral Hammers,  Geraldo Docker, MD   40 mg at 06/14/22 0914   polyethylene glycol (MIRALAX / GLYCOLAX) packet 17 g  17 g Oral Daily PRN Dessa Phi, DO   17 g at 06/12/22 0949   rosuvastatin (CRESTOR) tablet 10 mg  10 mg Oral QHS Dessa Phi, DO   10 mg at 06/13/22 2119   sodium chloride (OCEAN) 0.65 % nasal spray 1 spray  1 spray Each Nare PRN Allie Bossier, MD       sodium chloride flush (NS) 0.9 % injection 3 mL  3 mL Intravenous Q12H Orma Flaming, MD   3 mL at 06/14/22 0916   sodium chloride flush (NS) 0.9 % injection 3 mL  3 mL Intravenous PRN Orma Flaming, MD         Objective: Vital signs in last 24 hours: Temp:  [98 F (36.7 C)-98.9 F (37.2 C)] 98.5 F (36.9 C) (08/11 1149) Pulse Rate:  [59-60] 59 (08/11 1149) Resp:  [16-17] 17 (08/11 1149) BP: (133-175)/(48-71) 160/65 (08/11 1149) SpO2:  [95 %-97 %] 96 % (08/11 1149)  Intake/Output from previous day: 08/10 0701 - 08/11 0700 In: 750 [P.O.:750] Out: 1350 [Urine:1350] Intake/Output this shift: Total I/O In: 200 [P.O.:200] Out: -    Physical Exam Vitals reviewed.  Constitutional:      Appearance: Normal appearance.   Neurological:     Mental Status: She is alert.     Lab Results:  Recent Labs    06/12/22 0348  WBC 7.2  HGB 11.9*  HCT 37.4  PLT 218    BMET Recent Labs    06/13/22 0335 06/14/22 0803  NA 139 142  K 4.0 4.1  CL 103 110  CO2 26 25  GLUCOSE 123* 125*  BUN 48* 36*  CREATININE 1.94* 1.47*  CALCIUM 8.8* 9.5    PT/INR No results for input(s): "LABPROT", "INR" in the last 72 hours. ABG No results for input(s): "PHART", "HCO3" in the last 72 hours.  Invalid input(s): "PCO2", "PO2"  Studies/Results: No results found.   Assessment and Plan: Left ureteral and renal stones.   She is improved following stenting.  Her Cr has nearly normalized.  Cultures from renal pelvis were normal.  She will need ureteroscopy in 1-2 weeks.       LOS: 5 days    Irine Seal 06/14/2022 [phone]  Patient ID: Barbara [name], female   DOB: 08-16-1941, 81 y.o.   MRN: [id number]

## 2022-06-14 NOTE — Progress Notes (Signed)
Discharge instructions provided to and reviewed with patient, patient verbalized understanding.  PIV removed.  Patient transported to main entrance with belongings via wheelchair for transport home with brother-in-law Edd Arbour.  Angie Fava, RN

## 2022-06-14 NOTE — Progress Notes (Signed)
  PROGRESS NOTE  Patient was discharged yesterday, however appealed discharge as she had no ride home.  She remains stable for discharge today.  Creatinine continues to improve.  Patient reports having a ride home after 1 PM today.  Worked with physical therapy this morning.   Dessa Phi, DO [facility] 06/14/2022, 10:52 AM  Available via Epic secure chat 7am-7pm After these hours, please refer to coverage provider listed on [email or web address]

## 2022-06-14 NOTE — TOC Transition Note (Signed)
Transition of Care [facility]) - CM/SW Discharge Note   Patient Details  Name: Barbara [name] MRN: [id number] Date of Birth: Jun 22, 1941  Transition of [facility]) CM/SW Contact:  Leeroy Cha, RN Phone Number: 06/14/2022, 1:27 PM   Clinical Narrative:    Patient dcd to return to home with family.     Barriers to Discharge: Continued Medical Work up   Patient Goals and CMS Choice Patient states their goals for this hospitalization and ongoing recovery are:: to go back home CMS Medicare.gov Compare Post Acute Care list provided to:: Patient    Discharge Placement                       Discharge Plan and Services   Discharge Planning Services: CM Consult                                 Social Determinants of Health (SDOH) Interventions     Readmission Risk Interventions     No data to display

## 2022-06-14 NOTE — Plan of Care (Signed)

## 2022-06-15 LAB — CULTURE, BLOOD (ROUTINE X 2)
Culture: NO GROWTH
Culture: NO GROWTH
Special Requests: ADEQUATE
Special Requests: ADEQUATE

## 2022-06-17 ENCOUNTER — Encounter: Payer: Self-pay | Admitting: Cardiology

## 2022-06-17 ENCOUNTER — Other Ambulatory Visit: Payer: Self-pay

## 2022-06-17 DIAGNOSIS — N1831 Chronic kidney disease, stage 3a: Secondary | ICD-10-CM | POA: Diagnosis not present

## 2022-06-17 DIAGNOSIS — I214 Non-ST elevation (NSTEMI) myocardial infarction: Secondary | ICD-10-CM | POA: Diagnosis not present

## 2022-06-17 DIAGNOSIS — I2510 Atherosclerotic heart disease of native coronary artery without angina pectoris: Secondary | ICD-10-CM | POA: Diagnosis not present

## 2022-06-17 DIAGNOSIS — I130 Hypertensive heart and chronic kidney disease with heart failure and stage 1 through stage 4 chronic kidney disease, or unspecified chronic kidney disease: Secondary | ICD-10-CM | POA: Diagnosis not present

## 2022-06-17 DIAGNOSIS — I509 Heart failure, unspecified: Secondary | ICD-10-CM | POA: Diagnosis not present

## 2022-06-17 NOTE — Patient Outreach (Signed)
  Care Coordination [facility] Note Transition Care Management Follow-up Telephone Call Date of discharge and from where: 06/14/22 [facility] How have you been since you were released from the hospital? Feeling OK  Any questions or concerns? No  Items Reviewed: Did the pt receive and understand the discharge instructions provided? Yes  Medications obtained and verified? Yes  Other? No  Any new allergies since your discharge? No  Dietary orders reviewed? Yes Do you have support at home? Yes   Home Care and Equipment/Supplies: Were home health services ordered? no If so, what is the name of the agency? [facility] Has the agency set up a time to come to the patient's home? Currrenlty getting PT Were any new equipment or medical supplies ordered?  No What is the name of the medical supply agency? N/a Were you able to get the supplies/equipment? no Do you have any questions related to the use of the equipment or supplies? No  Functional Questionnaire: (I = Independent and D = Dependent) ADLs: I  Bathing/Dressing- I  Meal Prep- I  Eating- I  Maintaining continence- I  Transferring/Ambulation- I  Managing Meds- I  Follow up appointments reviewed:  PCP Hospital f/u appt confirmed? No   Specialist Hospital f/u appt confirmed? Yes  Scheduled to see Dr. Jeffie Pollock on 06/25/22 outpatient procedure. Are transportation arrangements needed? Yes  If their condition worsens, is the pt aware to call PCP or go to the Emergency Dept.? Yes Was the patient provided with contact information for the PCP's office or ED? Yes Was to pt encouraged to call back with questions or concerns? Yes  SDOH assessments and interventions completed:   Yes  Care Coordination Interventions Activated:  Yes   Care Coordination Interventions:  PCP follow up appointment requested    Encounter Outcome:  Pt. Visit Completed   Peter Garter RN, BSN,CCM, [place] Management Coordinator [facility]  Management [phone]

## 2022-06-17 NOTE — Progress Notes (Signed)
COVID Vaccine received:  '[]'$  No '[x]'$  Yes Date of any COVID positive Test in last 90 days:  PCP - Alysia Penna, MD Cardiologist - Dr. Shelva Majestic, Dr. Buford Dresser EP- Dr. Allegra Lai    Cardiac Clearance 06-10-22   Chest x-ray -  EKG -  06-09-22  Epic Stress Test -  ECHO -  Cardiac Cath - 05-06-22  Epic  Pacemaker device: last interrogation was 05-08-22.    DEVICE ORDERS REQUESTED BY STAFF MESSAGE Spinal Cord Stimulator:'[x]'$  No '[]'$  Yes   Other Implants:   Bowel Prep -   History of Sleep Apnea? '[]'$  No '[]'$  Yes   Sleep Study Date:   CPAP used?- '[]'$  No '[]'$  Yes  (Instruct to bring their mask & Tubing)  Does the patient monitor blood sugar? '[]'$  No '[]'$  Yes  '[]'$  N/A Does patient have a [employer] or Dexacom? '[]'$  No '[]'$  Yes   Fasting Blood Sugar Ranges-  Checks Blood Sugar _____ times a day  Blood Thinner Instructions: Eliquis   OK to hold 2 days per Dr. 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 Curt Bears   Patient denies shortness of breath, fever, cough and chest pain at PAT appointment.  Patient verbalized understanding and agreement to the Pre-Surgical Instructions that were given to them at this PAT appointment. Patient was also educated of the need to review these PAT instructions again prior to his/her surgery.I reviewed the appropriate phone numbers to call if they have any and questions or concerns.

## 2022-06-17 NOTE — Progress Notes (Signed)
PERIOPERATIVE PRESCRIPTION FOR IMPLANTED CARDIAC DEVICE PROGRAMMING  Patient Information: Name:  Barbara [name]  DOB:  April 09, 1941  MRN:  [id number]    Planned Procedure:  Cystoscopy, ureteroscopy with stent Left  Surgeon:  Irine Seal, MD  Date of Procedure:  06-25-22  Cautery will be used.  Position during surgery:  Supine   Please send documentation back to:  [facility] (Fax # [phone])   Device Information:  Clinic EP Physician:  Allegra Lai, MD   Device Type:  Pacemaker Manufacturer and Phone #:  Medtronic: [id number] Pacemaker Dependent?:  No. Date of Last Device Check:  05/22/2022 Normal Device Function?:  Yes.    Electrophysiologist's Recommendations:  Have magnet available. Provide continuous ECG monitoring when magnet is used or reprogramming is to be performed.  Procedure should not interfere with device function.  No device programming or magnet placement needed.  Per Device Clinic Standing Orders, Wanda Plump, RN  2:37 PM 06/17/2022

## 2022-06-17 NOTE — Patient Instructions (Signed)
DUE TO SPACE LIMITATIONS, ONLY TWO VISITORS  (aged 83 and older) ARE ALLOWED TO COME WITH YOU AND STAY IN THE WAITING ROOM DURING YOUR PRE OP AND PROCEDURE.   **NO VISITORS ARE ALLOWED IN THE SHORT STAY AREA OR RECOVERY ROOM!!**  You are not required to quarantine at this time prior to your surgery. However, you must do this: Hand Hygiene often Do NOT share personal items Notify your provider if you are in close contact with someone who has COVID or you develop fever 100.4 or greater, new onset of sneezing, cough, sore throat, shortness of breath or body aches.       Your procedure is scheduled on:  Tuesday June 25, 2022  Report to [facility] Main Entrance.  Report to admitting at: 11:30  AM  +++++Call this number if you have any questions or problems the morning of surgery [phone]  Do not eat food :After Midnight the night prior to your surgery/procedure.  After Midnight you may have the following liquids until  10:45 AM DAY OF SURGERY  Clear Liquid Diet Water Black Coffee (sugar ok, NO MILK/CREAM OR CREAMERS)  Tea (sugar ok, NO MILK/CREAM OR CREAMERS) regular and decaf                             Plain Jell-O (NO RED)                                           Fruit ices (not with fruit pulp, NO RED)                                     Popsicles (NO RED)                                                                  Juice: apple, WHITE grape, WHITE cranberry Sports drinks like Gatorade (NO RED)                FOLLOW BOWEL PREP AND ANY ADDITIONAL PRE OP INSTRUCTIONS YOU RECEIVED FROM YOUR SURGEON'S OFFICE!!!   Oral Hygiene is also important to reduce your risk of infection.        Remember - BRUSH YOUR TEETH THE MORNING OF SURGERY WITH YOUR REGULAR TOOTHPASTE  Take ONLY these medicines the morning of surgery with A SIP OF WATER: isosorbide, Amiodarone, Amlodipine, and Protonix  Bring CPAP mask and tubing day of surgery.                   You may not have  any metal on your body including hair pins, jewelry, and body piercing  Do not wear make-up, lotions, powders, perfumes/cologne, or deodorant  Do not wear nail polish including gel and S&S, artificial / acrylic nails, or any other type of covering on natural nails including finger and toenails. If you have artificial nails, gel coating, etc., that needs to be removed by a nail salon, Please have this removed prior to surgery. Not doing so may mean that your surgery could  be cancelled or delayed if the Surgeon or anesthesia staff feels like they are unable to monitor you safely.   Do not shave 48 hours prior to surgery to avoid nicks in your skin which may contribute to postoperative infections.   Contacts, Hearing Aids, dentures or bridgework may not be worn into surgery.    DO NOT [place]. PHARMACY WILL DISPENSE MEDICATIONS LISTED ON YOUR MEDICATION LIST TO YOU DURING YOUR ADMISSION [place]!   Patients discharged on the day of surgery will not be allowed to drive home.  Someone NEEDS to stay with you for the first 24 hours after anesthesia.  Special Instructions: Bring a copy of your healthcare power of attorney and living will documents the day of surgery, if you wish to have them scanned into your [facility] Medical Records- EPIC  Please read over the following fact sheets you were given: IF YOU HAVE QUESTIONS ABOUT YOUR PRE-OP INSTRUCTIONS, PLEASE CALL [phone]  ([place])   [place] - Preparing for Surgery Before surgery, you can play an important role.  Because skin is not sterile, your skin needs to be as free of germs as possible.  You can reduce the number of germs on your skin by washing with CHG (chlorahexidine gluconate) soap before surgery.  CHG is an antiseptic cleaner which kills germs and bonds with the skin to continue killing germs even after washing. Please DO NOT use if you have an allergy to CHG or antibacterial soaps.  If your  skin becomes reddened/irritated stop using the CHG and inform your nurse when you arrive at Short Stay. Do not shave (including legs and underarms) for at least 48 hours prior to the first CHG shower.  You may shave your face/neck.  Please follow these instructions carefully:  1.  Shower with CHG Soap the night before surgery and the  morning of surgery.  2.  If you choose to wash your hair, wash your hair first as usual with your normal  shampoo.  3.  After you shampoo, rinse your hair and body thoroughly to remove the shampoo.                             4.  Use CHG as you would any other liquid soap.  You can apply chg directly to the skin and wash.  Gently with a scrungie or clean washcloth.  5.  Apply the CHG Soap to your body ONLY FROM THE NECK DOWN.   Do not use on face/ open                           Wound or open sores. Avoid contact with eyes, ears mouth and genitals (private parts).                       Wash face,  Genitals (private parts) with your normal soap.             6.  Wash thoroughly, paying special attention to the area where your  surgery  will be performed.  7.  Thoroughly rinse your body with warm water from the neck down.  8.  DO NOT shower/wash with your normal soap after using and rinsing off the CHG Soap.            9.  Pat yourself dry with a clean towel.  10.  Wear clean pajamas.            11.  Place clean sheets on your bed the night of your first shower and do not  sleep with pets.  ON THE DAY OF SURGERY : Do not apply any lotions/deodorants the morning of surgery.  Please wear clean clothes to the hospital/surgery center.    FAILURE TO FOLLOW THESE INSTRUCTIONS MAY RESULT IN THE CANCELLATION OF YOUR SURGERY  PATIENT SIGNATURE_________________________________  NURSE SIGNATURE__________________________________  ________________________________________________________________________

## 2022-06-18 ENCOUNTER — Encounter (HOSPITAL_COMMUNITY): Payer: Self-pay

## 2022-06-18 ENCOUNTER — Other Ambulatory Visit: Payer: Self-pay

## 2022-06-18 ENCOUNTER — Encounter (HOSPITAL_COMMUNITY)
Admission: RE | Admit: 2022-06-18 | Discharge: 2022-06-18 | Disposition: A | Discharge status: Home / Self Care | Payer: Medicare Other | Source: Ambulatory Visit | Attending: Urology | Admitting: Urology

## 2022-06-18 VITALS — BP 131/53 | HR 60 | Temp 98.3°F | Resp 14 | Ht 67.0 in | Wt 175.0 lb

## 2022-06-18 DIAGNOSIS — I2510 Atherosclerotic heart disease of native coronary artery without angina pectoris: Secondary | ICD-10-CM | POA: Insufficient documentation

## 2022-06-18 DIAGNOSIS — I10 Essential (primary) hypertension: Secondary | ICD-10-CM | POA: Diagnosis not present

## 2022-06-18 DIAGNOSIS — Z01812 Encounter for preprocedural laboratory examination: Secondary | ICD-10-CM | POA: Insufficient documentation

## 2022-06-18 HISTORY — DX: Sleep apnea, unspecified: G47.30

## 2022-06-18 HISTORY — DX: Personal history of urinary calculi: Z87.442

## 2022-06-18 LAB — BASIC METABOLIC PANEL
Anion gap: 6 (ref 5–15)
BUN: 26 mg/dL — ABNORMAL HIGH (ref 8–23)
CO2: 23 mmol/L (ref 22–32)
Calcium: 9.5 mg/dL (ref 8.9–10.3)
Chloride: 108 mmol/L (ref 98–111)
Creatinine, Ser: 1.57 mg/dL — ABNORMAL HIGH (ref 0.44–1.00)
GFR, Estimated: 33 mL/min — ABNORMAL LOW (ref >60)
Glucose, Bld: 97 mg/dL (ref 70–99)
Potassium: 4.2 mmol/L (ref 3.5–5.1)
Sodium: 137 mmol/L (ref 135–145)

## 2022-06-18 LAB — CBC
HCT: 38 % (ref 36.0–46.0)
Hemoglobin: 11.7 g/dL — ABNORMAL LOW (ref 12.0–15.0)
MCH: 28.6 pg (ref 26.0–34.0)
MCHC: 30.8 g/dL (ref 30.0–36.0)
MCV: 92.9 fL (ref 80.0–100.0)
Platelets: 241 10*3/uL (ref 150–400)
RBC: 4.09 MIL/uL (ref 3.87–5.11)
RDW: 13.5 % (ref 11.5–15.5)
WBC: 5.7 10*3/uL (ref 4.0–10.5)
nRBC: 0 % (ref 0.0–0.2)

## 2022-06-19 ENCOUNTER — Other Ambulatory Visit (HOSPITAL_COMMUNITY): Payer: Self-pay

## 2022-06-19 ENCOUNTER — Telehealth: Payer: Self-pay | Admitting: Family Medicine

## 2022-06-19 DIAGNOSIS — I130 Hypertensive heart and chronic kidney disease with heart failure and stage 1 through stage 4 chronic kidney disease, or unspecified chronic kidney disease: Secondary | ICD-10-CM | POA: Diagnosis not present

## 2022-06-19 DIAGNOSIS — I2510 Atherosclerotic heart disease of native coronary artery without angina pectoris: Secondary | ICD-10-CM | POA: Diagnosis not present

## 2022-06-19 DIAGNOSIS — I509 Heart failure, unspecified: Secondary | ICD-10-CM | POA: Diagnosis not present

## 2022-06-19 DIAGNOSIS — I214 Non-ST elevation (NSTEMI) myocardial infarction: Secondary | ICD-10-CM | POA: Diagnosis not present

## 2022-06-19 DIAGNOSIS — N1831 Chronic kidney disease, stage 3a: Secondary | ICD-10-CM | POA: Diagnosis not present

## 2022-06-19 NOTE — Telephone Encounter (Signed)
Pt requesting a refill of isosorbide mononitrate (IMDUR) 30 MG 24 hr tablet was prescribed medication while in hospital last month. She is out [place], [place] Phone:  [phone]  Fax:  [phone]

## 2022-06-20 MED ORDER — ISOSORBIDE MONONITRATE ER 30 MG PO TB24: 30.0000 mg | ORAL_TABLET | Freq: Every day | ORAL | 3 refills | Status: DC

## 2022-06-20 NOTE — Addendum Note (Signed)
Addended by: Alysia Penna A on: 06/20/2022 10:43 AM   Modules accepted: Orders

## 2022-06-20 NOTE — Telephone Encounter (Signed)
Done

## 2022-06-21 ENCOUNTER — Telehealth: Payer: Self-pay | Admitting: Family Medicine

## 2022-06-21 NOTE — Telephone Encounter (Signed)
Call in Lasix 20 mg to take one daily, #30 with no rf

## 2022-06-21 NOTE — Telephone Encounter (Signed)
Pt called to say she has had swelling in her feet and hands for 2 days.  She is scheduled to see MD on Monday. Pt is asking if MD wants her to do anything (ie: take a water pill) before her appt.?  Please advise.  [phone]

## 2022-06-22 NOTE — Progress Notes (Unsigned)
Cardiology Office Note Date:  06/24/2022  Patient ID:  Barbara, [name] December 13, 1940, MRN [id number] PCP:  Laurey Morale, MD  Cardiologist:  Dr. Claiborne Billings Electrophysiologist: Dr. Curt Bears  Chief Complaint:  1 mo  History of Present Illness: Barbara [name] is a 81 y.o. female with history of AFib, apical hypertrophic cardiomyopathy, coronary artery disease with history of PCI of the LAD, hypertension, hyperlipidemia, OSA, CKD (III)  July 2023 she admitted to [facility] with CP, w/concerns of NSTEMI got a dose of BB resulting in development of marked bradycardia > glucagon, atropine and isuprel with improvement. Her home Tikosyn stopped Planned for cath, though suspect 2/2 demand ischemia 2/2 RVR on presentation and EP consulted for tachy-brady She did ultimately have LHC without obstructive disease and subsequent PPM implant. We did pursue re-load of hie tikosyn but she developed QT prolongation and was aborted. She was then started on amiodarone She was found to have apical HCM and planned to have her referred to genetics out pt. Discharged 05/10/22  She saw A. Tillery, PA-C 05/22/22, maintaining SR, planned for down-titration of amio to '200mg'$  daily on 06/10/22  Admitted 06/09/22 - 06/14/22 w/LLQ pain, found with renal stones, had a cystoscopy and L urethral stent  TODAY She is doing well Hoping they can get the kidney stone out tomorrow. She has held her Eliquis for the cystoscopy tomorrow No CP, palpitations or SOB No cardiac awareness. No near syncope or syncope. + ankle swelling, was told is her amlodipine  Device information MDT dual chamber PPM implanted 05/08/22 (NOTE: done under general anesthesia 2/2 severe allergy to local anesthesias)  AFib/AAD hx Tikosyn >> stopped/failed re-load with QT prolongation July 2023 Amiodarone started July 2023   Past Medical History:  Diagnosis Date   A-fib [facility])    Allergy    CAD (coronary artery disease)    sees Dr. Shelva Majestic  cardiac stents - 2000   CHF (congestive heart failure) ([place])    Colon polyps    Complication of anesthesia    rash/hives with "caines"   Dyspnea    02/12/18 " when my heart gets out of rhythm, it has not been out of rhythym- since I have been on Tikosyn (11/2017)   Dysrhythmia    afib fib   GERD (gastroesophageal reflux disease)    takes OTC- Omeprazole- prn   Heart murmur    History of kidney stones    History of stress test    show normal perfusion without scar or ischemia, post EF 68%   Hx of echocardiogram    show an EF 55%-60% range with grade 1 diastolic dysfunction, she had mitral anular calcification with mild MR, moderate LA dilation and mild pulmonary hypertension with a PA estimated pressure of 100m   Hyperlipidemia    Hypertension    [place]STEMI (non-ST elevated [place]) [place]    Osteoarthritis    Pneumonia    hx of 2015    PONV (postoperative nausea and vomiting)    Sleep apnea     Past Surgical History:  Procedure Laterality Date   CARDIAC CATHETERIZATION     11/2017   cardiac stents  2000   COLONOSCOPY  01-05-14   per Dr. JTeena Irani clear, no repeats needed    COLONOSCOPY     CORONARY ST[place]  2000   [place] Left 06/11/2022   Procedure: [place] LEFT [place]  Surgeon:  Lucas Mallow, MD;  [facility];  Service: Urology;  Laterality: Left;   DIRECT LARYNGOSCOPY WITH RADIAESSE INJECTION N/A 02/13/2018   Procedure: DIRECT LARYNGOSCOPY WITH RADIAESSE INJECTION;  Surgeon: Melida Quitter, MD;  Location: [place];  Service: ENT;  Laterality: N/A;   EYE SURGERY Left    CATARACT REMOVAL   KNEE ARTHROSCOPY Left 01/06/2015   Procedure: LEFT KNEE ARTHROSCOPY, abrasion chondroplasty of the medial femerol condryl,medial and lateral menisectomy, microfracture , synovectomy of the suprpatellar pouch;  Surgeon: Latanya Maudlin, MD;  [facility];  Service: Orthopedics;  Laterality: Left;   LEFT HEART CATH AND CORONARY ANGIOGRAPHY N/A 02/26/2019   Procedure: LEFT HEART CATH AND CORONARY ANGIOGRAPHY;  Surgeon: Troy Sine, MD;  Location: [place] CV LAB;  Service: Cardiovascular;  Laterality: N/A;   LEFT HEART CATH AND CORONARY ANGIOGRAPHY N/A 05/06/2022   Procedure: LEFT HEART CATH AND CORONARY ANGIOGRAPHY;  Surgeon: Nelva Bush, MD;  Location: [place] CV LAB;  Service: Cardiovascular;  Laterality: N/A;   MICROLARYNGOSCOPY W/VOCAL CORD INJECTION N/A 08/07/2018   Procedure: MICROLARYNGOSCOPY WITH VOCAL CORD INJECTION OF PROLARYN;  Surgeon: Melida Quitter, MD;  Location: [place];  Service: ENT;  Laterality: N/A;  JET VENTILATION   PACEMAKER IMPLANT N/A 05/08/2022   Procedure: PACEMAKER IMPLANT;  Surgeon: Constance Haw, MD;  Location: [place] CV LAB;  Service: Cardiovascular;  Laterality: N/A;   REVERSE SHOULDER ARTHROPLASTY Right 03/23/2020   Procedure: REVERSE SHOULDER ARTHROPLASTY;  Surgeon: Nicholes Stairs, MD;  Location: [place];  Service: Orthopedics;  Laterality: Right;   VAGINAL HYSTERECTOMY  1971    Current Outpatient Medications  Medication Sig Dispense Refill   acetaminophen (TYLENOL) 500 MG tablet Take 500 mg by mouth daily as needed for headache.     amiodarone (PACERONE) 200 MG tablet Take 1 tablet (200 mg total) by mouth daily. 90 tablet 3   amLODipine (NORVASC) 5 MG tablet Take 1 tablet (5 mg total) by mouth daily. 30 tablet 1   apixaban (ELIQUIS) 2.5 MG TABS tablet Take 1 tablet (2.5 mg total) by mouth 2 (two) times daily. 60 tablet 5   aspirin 81 MG chewable tablet Chew 1 tablet (81 mg total) by mouth daily. 30 tablet 0   Cholecalciferol (VITAMIN D3) 2000 units TABS Take 2,000 Units by mouth daily.     dextromethorphan (DELSYM) 30 MG/5ML liquid Take 15 mg by mouth daily as needed for cough.     docusate sodium (COLACE) 100 MG capsule Take 1 capsule (100 mg total) by mouth 2 (two) times daily. 60  capsule 5   hydrALAZINE (APRESOLINE) 50 MG tablet Take 1 tablet (50 mg total) by mouth every 8 (eight) hours. 90 tablet 0   ipratropium (ATROVENT) 0.06 % nasal spray Place 2 sprays into both nostrils 2 (two) times daily as needed (allergies).  5   isosorbide mononitrate (IMDUR) 30 MG 24 hr tablet Take 1 tablet (30 mg total) by mouth daily. 90 tablet 3   loratadine (CLARITIN) 10 MG tablet Take 10 mg by mouth daily as needed for allergies.     LORazepam (ATIVAN) 0.5 MG tablet TAKE 1 TABLET BY MOUTH EVERY 6 HOURS AS NEEDED FOR ANXIETY 60 tablet 5   metoprolol succinate (TOPROL-XL) 25 MG 24 hr tablet Take 1 tablet (25 mg total) by mouth daily. 90 tablet 3   Multiple Vitamin (MULTIVITAMIN WITH MINERALS) TABS tablet Take 1 tablet by mouth daily.     nitroGLYCERIN (NITROSTAT) 0.4 MG SL tablet DISSOLVE  ONE TABLET UNDER THE TONGUE EVERY 5 MINUTES AS NEEDED FOR CHEST PAIN.  DO NOT EXCEED A TOTAL OF 3 DOSES IN 15 MINUTES 25 tablet 3   pantoprazole (PROTONIX) 40 MG tablet Take 1 tablet (40 mg total) by mouth daily. 30 tablet 10   polyethylene glycol powder (GLYCOLAX/MIRALAX) 17 GM/SCOOP powder Take 17 g by mouth daily. 238 g 0   PRESCRIPTION MEDICATION See admin instructions. CPAP- At bedtime     Probiotic Product (PROBIOTIC GUMMIES PO) Take 2 tablets by mouth every morning.     rosuvastatin (CRESTOR) 40 MG tablet Take 1 tablet (40 mg total) by mouth daily. 90 tablet 3   triamcinolone (NASACORT) 55 MCG/ACT nasal inhaler Place 1 spray into both nostrils daily as needed (for allergies).      No current facility-administered medications for this visit.    Allergies:   Lidocaine, Morphine, Procaine hcl, Sulfonamide derivatives, Norco [hydrocodone-acetaminophen], Tramadol, Vytorin [ezetimibe-simvastatin], and Tape   Social History:  The patient  reports that she quit smoking about 25 years ago. Her smoking use included cigarettes. She has never used smokeless tobacco. She reports that she does not drink alcohol  and does not use drugs.   Family History:  The patient's family history includes Cancer in her maternal grandmother; Heart disease in her maternal grandfather.  ROS:  Please see the history of present illness.    All other systems are reviewed and otherwise negative.   PHYSICAL EXAM:  VS:  BP (!) 141/56   Pulse 60   Ht 5' 7.5" (1.715 m)   Wt 179 lb 12.8 oz (81.6 kg)   SpO2 96%   BMI 27.75 kg/m  BMI: Body mass index is 27.75 kg/m. Well nourished, well developed, in no acute distress HEENT: normocephalic, atraumatic Neck: no JVD, carotid bruits or masses Cardiac:   RRR; no significant murmurs, no rubs, or gallops Lungs:   CTA b/l, no wheezing, rhonchi or rales Abd: soft, nontender MS: no deformity or atrophy Ext: + ankle edema b/l Skin: warm and dry, no rash Neuro:  No gross deficits appreciated Psych: euthymic mood, full affect   PPM site is stable, no tethering or discomfort, well healed   EKG:  not done today  Device interrogation done today and reviewed by myself:  Battery and lead measurements are good No AF  05/06/22: LHC Conclusions: Mild-moderate, non-obstructive coronary artery disease. Patent proximal/mid LAD stent with up to 30% in-stent restenosis. Low left ventricular filling pressure (LVEDP ~5 mmHg). Recommendations: Continue secondary prevention of coronary artery disease. Proceed with pacemaker placement per Dr. Caryl Comes.  05/04/22: TTE  1. Left ventricular ejection fraction, by estimation, is >75%. The left  ventricle has hyperdynamic function. The left ventricle has no regional  wall motion abnormalities. Left ventricular diastolic parameters are  indeterminate. Elevated left atrial  pressure.   2. Right ventricular systolic function is normal. The right ventricular  size is normal. There is mildly elevated pulmonary artery systolic  pressure.   3. Left atrial size was moderately dilated.   4. The mitral valve is normal in structure. Moderate mitral  valve  regurgitation. No evidence of mitral stenosis.   5. The aortic valve is tricuspid. Aortic valve regurgitation is not  visualized. Aortic valve sclerosis is present, with no evidence of aortic  valve stenosis.   6. The inferior vena cava is normal in size with greater than 50%  respiratory variability, suggesting right atrial pressure of 3 mmHg.    10/08/2020; TTE  1. Left ventricular  ejection fraction, by estimation, is 70 to 75%. The  left ventricle has hyperdynamic function. The left ventricle has no  regional wall motion abnormalities. There is moderate eccentric left  ventricular hypertrophy of the apical  segment. Left ventricular diastolic parameters are indeterminate. Elevated  left ventricular end-diastolic pressure.   2. Right ventricular systolic function is normal. The right ventricular  size is normal. Tricuspid regurgitation signal is inadequate for assessing  PA pressure.   3. The mitral valve is abnormal. Mild mitral valve regurgitation.  Moderate mitral annular calcification.   4. The aortic valve is grossly normal. There is mild calcification of the  aortic valve. There is mild thickening of the aortic valve. Aortic valve  regurgitation is not visualized. Mild aortic valve sclerosis is present,  with no evidence of aortic valve  stenosis.   Comparison(s): No significant change from prior study.   05/19/2020: c.MRI IMPRESSION: 1. LV apical hypertrophy measuring up to 59m (711min basal posterior wall), consistent with apical hypertrophic cardiomyopathy 2. Patchy late gadolinium enhancement at apex and RV insertion site, consistent with apical HCM. LGE accounts for 2% of total myocardial mass 3.  Normal LV size with hyperdynamic systolic function (EF 7660%4.  Normal RV size with hyperdynamic systolic function (EF 7873%     02/26/2019: LHC Prox RCA to Mid RCA lesion is 15% stenosed. Ost LAD to Prox LAD lesion is 20% stenosed. Prox Cx lesion is 20%  stenosed.   Mild non-obstructive coronary artery disease with a patent proximal LAD stent with mild 20% intimal hyperplasia (inserted  09/1999); 20% proximal left circumflex narrowing, and mild irregularity of 10 to 15% in the mid RCA.   Hyperdynamic LV function with an "Ace of Spade "configuration and near cavity obliteration in the mid to apical segment during systole.  There is evidence for left ventricular hypertrophy.  LVEDP is 16 mm.  Recent Labs: 06/09/2022: B Natriuretic Peptide 593.4 06/12/2022: ALT 15; Magnesium 2.1 06/18/2022: BUN 26; Creatinine, Ser 1.57; Hemoglobin 11.7; Platelets 241; Potassium 4.2; Sodium 137  No results found for requested labs within last 365 days.   Estimated Creatinine Clearance: 30.1 mL/min (A) (by C-G formula based on SCr of 1.57 mg/dL (H)).   Wt Readings from Last 3 Encounters:  06/24/22 179 lb 12.8 oz (81.6 kg)  06/18/22 175 lb (79.4 kg)  06/13/22 174 lb 2.6 oz (79 kg)     Other studies reviewed: Additional studies/records reviewed today include: summarized above  ASSESSMENT AND PLAN:  PPM Intact function No programming changes made  Paroxysmal Afib CHA2DS2Vasc is 5, on Eliquis, appropriately dosed for creat/age zero% burden on amiodarone LFTs ok, check TSH today  CAD No anginal symptoms C/w Dr. KeDanne HarborCM Discussed genetics referral, she is agreeable Will refer  5. HTN Ankle edema, will stop her amlodipine Double up on her Toprol to '50mg'$  daily She will keep an eye on her BP, let usKoreanow if >140/90 recurrently  Disposition: F/u with Dr. CaCurt Bearss scheduled, sooner if needed  Current medicines are reviewed at length with the patient today.  The patient did not have any concerns regarding medicines.  SiVenetia NightPA-C 06/24/2022 9:42 AM     [place] [phone])  [phone])

## 2022-06-24 ENCOUNTER — Ambulatory Visit (INDEPENDENT_AMBULATORY_CARE_PROVIDER_SITE_OTHER): Payer: Medicare Other | Admitting: Family Medicine

## 2022-06-24 ENCOUNTER — Other Ambulatory Visit: Payer: Self-pay

## 2022-06-24 ENCOUNTER — Encounter: Payer: Self-pay | Admitting: Physician Assistant

## 2022-06-24 ENCOUNTER — Ambulatory Visit: Payer: Medicare Other | Admitting: Physician Assistant

## 2022-06-24 ENCOUNTER — Encounter: Payer: Self-pay | Admitting: Family Medicine

## 2022-06-24 VITALS — BP 122/64 | HR 60 | Temp 99.1°F | Wt 178.0 lb

## 2022-06-24 VITALS — BP 132/60 | HR 60 | Ht 67.5 in | Wt 179.8 lb

## 2022-06-24 DIAGNOSIS — N179 Acute kidney failure, unspecified: Secondary | ICD-10-CM

## 2022-06-24 DIAGNOSIS — N200 Calculus of kidney: Secondary | ICD-10-CM

## 2022-06-24 DIAGNOSIS — I10 Essential (primary) hypertension: Secondary | ICD-10-CM | POA: Diagnosis not present

## 2022-06-24 DIAGNOSIS — N1832 Chronic kidney disease, stage 3b: Secondary | ICD-10-CM | POA: Diagnosis not present

## 2022-06-24 DIAGNOSIS — I480 Paroxysmal atrial fibrillation: Secondary | ICD-10-CM | POA: Diagnosis not present

## 2022-06-24 DIAGNOSIS — I422 Other hypertrophic cardiomyopathy: Secondary | ICD-10-CM | POA: Diagnosis not present

## 2022-06-24 DIAGNOSIS — Z79899 Other long term (current) drug therapy: Secondary | ICD-10-CM | POA: Diagnosis not present

## 2022-06-24 DIAGNOSIS — Z950 Presence of cardiac pacemaker: Secondary | ICD-10-CM

## 2022-06-24 DIAGNOSIS — I2510 Atherosclerotic heart disease of native coronary artery without angina pectoris: Secondary | ICD-10-CM | POA: Diagnosis not present

## 2022-06-24 LAB — CUP PACEART INCLINIC DEVICE CHECK
Battery Remaining Longevity: 152 mo
Battery Voltage: 3.21 V
Brady Statistic AP VP Percent: 0.04 %
Brady Statistic AP VS Percent: 96.57 %
Brady Statistic AS VP Percent: 0 %
Brady Statistic AS VS Percent: 3.39 %
Brady Statistic RA Percent Paced: 96.6 %
Brady Statistic RV Percent Paced: 0.04 %
Date Time Interrogation Session: 20230821125410
Implantable Lead Implant Date: 20230705
Implantable Lead Implant Date: 20230705
Implantable Lead Location: 753859
Implantable Lead Location: 753860
Implantable Lead Model: 3830
Implantable Lead Model: 5076
Implantable Pulse Generator Implant Date: 20230705
Lead Channel Impedance Value: 323 Ohm
Lead Channel Impedance Value: 361 Ohm
Lead Channel Impedance Value: 494 Ohm
Lead Channel Impedance Value: 532 Ohm
Lead Channel Pacing Threshold Amplitude: 1 V
Lead Channel Pacing Threshold Amplitude: 1.125 V
Lead Channel Pacing Threshold Pulse Width: 0.4 ms
Lead Channel Pacing Threshold Pulse Width: 0.4 ms
Lead Channel Sensing Intrinsic Amplitude: 1.375 mV
Lead Channel Sensing Intrinsic Amplitude: 1.375 mV
Lead Channel Sensing Intrinsic Amplitude: 13 mV
Lead Channel Sensing Intrinsic Amplitude: 15.25 mV
Lead Channel Setting Pacing Amplitude: 2 V
Lead Channel Setting Pacing Amplitude: 2.25 V
Lead Channel Setting Pacing Pulse Width: 0.4 ms
Lead Channel Setting Sensing Sensitivity: 0.9 mV

## 2022-06-24 LAB — TSH: TSH: 2.63 u[IU]/mL (ref 0.450–4.500)

## 2022-06-24 MED ORDER — METOPROLOL SUCCINATE ER 50 MG PO TB24: 50.0000 mg | ORAL_TABLET | Freq: Every day | ORAL | 2 refills | Status: DC

## 2022-06-24 MED ORDER — FUROSEMIDE 20 MG PO TABS: 20.0000 mg | ORAL_TABLET | Freq: Every day | ORAL | 0 refills | Status: DC

## 2022-06-24 NOTE — Progress Notes (Addendum)
Anesthesia Chart Review   Case: [id number] Date/Time: 06/25/22 1335   Procedure: CYSTOSCOPY LEFT URETEROSCOPY/HOLMIUM LASER/STENT PLACEMENT (Left) - 1 HR FOR THIS CASE   Anesthesia type: General   Pre-op diagnosis: LEFT URETERAL AND RENAL STONES   Location: [place] [facility]   Surgeons: Irine Seal, MD       DISCUSSION:81 y.o. former smoker with h/o PONV, HTN, CAD (PCI of the LAD), apical hypertrophic cardiomyopathy, a-fib, pacemaker in place (device orders in 06/17/2022 progress note, procedure should not interfere), sleep apnea, CKD Stage III, left ureteral and renal stones scheduled for above procedure 06/25/2022 with Dr. Irine Seal.   Pt seen by cardio 06/24/2022, stable at this visit.   Advised to hold Eliquis 2 days prior to surgery.   PCP f/u 06/21/2022, stable at this visit.   Anticipate pt can proceed with planned procedure barring acute status change.   VS: BP (!) 131/53 Comment: right arm sitting  Pulse 60   Temp 36.8 C   Resp 14   Ht '5\' 7"'$  (1.702 m)   Wt 79.4 kg   SpO2 98%   BMI 27.41 kg/m   PROVIDERS: Laurey Morale, MD is PCP   Cardiologist:  Dr. Claiborne Billings Electrophysiologist: Dr. Curt Bears LABS: Labs reviewed: Acceptable for surgery. (all labs ordered are listed, but only abnormal results are displayed)  Labs Reviewed  BASIC METABOLIC PANEL - Abnormal; Notable for the following components:      Result Value   BUN 26 (*)    Creatinine, Ser 1.57 (*)    GFR, Estimated 33 (*)    All other components within normal limits  CBC - Abnormal; Notable for the following components:   Hemoglobin 11.7 (*)    All other components within normal limits     IMAGES:   EKG:   CV: Cardiac Cath 05/06/2022 Conclusions: Mild-moderate, non-obstructive coronary artery disease. Patent proximal/mid LAD stent with up to 30% in-stent restenosis. Low left ventricular filling pressure (LVEDP ~5 mmHg).   Recommendations: Continue secondary prevention of coronary artery  disease. Proceed with pacemaker placement per Dr. Caryl Comes.  Echo 05/04/2022 1. Left ventricular ejection fraction, by estimation, is >75%. The left  ventricle has hyperdynamic function. The left ventricle has no regional  wall motion abnormalities. Left ventricular diastolic parameters are  indeterminate. Elevated left atrial  pressure.   2. Right ventricular systolic function is normal. The right ventricular  size is normal. There is mildly elevated pulmonary artery systolic  pressure.   3. Left atrial size was moderately dilated.   4. The mitral valve is normal in structure. Moderate mitral valve  regurgitation. No evidence of mitral stenosis.   5. The aortic valve is tricuspid. Aortic valve regurgitation is not  visualized. Aortic valve sclerosis is present, with no evidence of aortic  valve stenosis.   6. The inferior vena cava is normal in size with greater than 50%  respiratory variability, suggesting right atrial pressure of 3 mmHg.   Myocardial Perfusion 10/06/2014 Impression Exercise Capacity:  Lexiscan with no exercise. BP Response:  Normal blood pressure response. Clinical Symptoms:  No significant symptoms noted. ECG Impression:  No significant ECG changes with Lexiscan. Comparison with Prior Nuclear Study: No significant change from previous study   Overall Impression:  Normal stress nuclear study. Past Medical History:  Diagnosis Date   A-fib [facility])    Allergy    CAD (coronary artery disease)    sees Dr. Shelva Majestic  cardiac stents - 2000   CHF (congestive  heart failure) ([place])    Colon polyps    Complication of anesthesia    rash/hives with "caines"   Dyspnea    02/12/18 " when my heart gets out of rhythm, it has not been out of rhythym- since I have been on Tikosyn (11/2017)   Dysrhythmia    afib fib   GERD (gastroesophageal reflux disease)    takes OTC- Omeprazole- prn   Heart murmur    History of kidney stones    History of stress test    show normal  perfusion without scar or ischemia, post EF 68%   Hx of echocardiogram    show an EF 55%-60% range with grade 1 diastolic dysfunction, she had mitral anular calcification with mild MR, moderate LA dilation and mild pulmonary hypertension with a PA estimated pressure of 88m   Hyperlipidemia    Hypertension    NSTEMI (non-ST elevated myocardial infarction) ([place]    Osteoarthritis    Pneumonia    hx of 2015    PONV (postoperative nausea and vomiting)    Sleep apnea     Past Surgical History:  Procedure Laterality Date   CARDIAC CATHETERIZATION     11/2017   cardiac stents  2000   COLONOSCOPY  01-05-14   per Dr. JTeena Irani clear, no repeats needed    COLONOSCOPY     CORONARY STENT PLACEMENT  2000   [place]TER[place] AND ST[place] PLACEMENT Left 06/11/2022   Procedure: CYSTOSCOPY WITH RETROGRADE PYELOGRAM, LEFT STENT PLACEMENT;  Surgeon: BLucas Mallow MD;  [facility];  Service: Urology;  Laterality: Left;   DIRECT LARYNGOSCOPY WITH RADIAESSE INJECTION N/A 02/13/2018   Procedure: DIRECT LARYNGOSCOPY WITH RADIAESSE INJECTION;  Surgeon: BMelida Quitter MD;  [place]  Service: ENT;  Laterality: N/A;   EYE SURGERY Left    CATARACT REMOVAL   KNEE ARTHROSCOPY Left 01/06/2015   Procedure: LEFT KNEE ARTHROSCOPY, abrasion chondroplasty of the medial femerol condryl,medial and lateral menisectomy, microfracture , synovectomy of the suprpatellar pouch;  Surgeon: RLatanya Maudlin MD;  [facility];  Service: Orthopedics;  Laterality: Left;   LEFT HEART CATH AND CORONARY ANGIOGRAPHY N/A 02/26/2019   Procedure: LEFT HEART CATH AND CORONARY ANGIOGRAPHY;  Surgeon: KTroy Sine MD;  [place];  Service: Cardiovascular;  Laterality: N/A;   LEFT HEART CATH AND CORONARY ANGIOGRAPHY N/A 05/06/2022   Procedure: LEFT HEART CATH AND CORONARY ANGIOGRAPHY;  Surgeon: ENelva Bush MD;  [place];  Service:  Cardiovascular;  Laterality: N/A;   MICROLARYNGOSCOPY W/VOCAL CORD INJECTION N/A 08/07/2018   Procedure: MICROLARYNGOSCOPY WITH VOCAL CORD INJECTION OF PROLARYN;  Surgeon: BMelida Quitter MD;  [place]  Service: ENT;  Laterality: N/A;  JET VENTILATION   PACEMAKER IMPLANT N/A 05/08/2022   Procedure: PACEMAKER IMPLANT;  Surgeon: CConstance Haw MD;  [place];  Service: Cardiovascular;  Laterality: N/A;   REVERSE SHOULDER ARTHROPLASTY Right 03/23/2020   Procedure: REVERSE SHOULDER ARTHROPLASTY;  Surgeon: RNicholes Stairs MD;  [place]  Service: Orthopedics;  Laterality: Right;   VAGINAL HYSTERECTOMY  1971    MEDICATIONS:  acetaminophen (TYLENOL) 500 MG tablet   amiodarone (PACERONE) 200 MG tablet   apixaban (ELIQUIS) 2.5 MG TABS tablet   aspirin 81 MG chewable tablet   Cholecalciferol (VITAMIN D3) 2000 units TABS   dextromethorphan (DELSYM) 30 MG/5ML liquid   docusate sodium (COLACE) 100 MG capsule   hydrALAZINE (  APRESOLINE) 50 MG tablet   ipratropium (ATROVENT) 0.06 % nasal spray   loratadine (CLARITIN) 10 MG tablet   LORazepam (ATIVAN) 0.5 MG tablet   Multiple Vitamin (MULTIVITAMIN WITH MINERALS) TABS tablet   nitroGLYCERIN (NITROSTAT) 0.4 MG SL tablet   pantoprazole (PROTONIX) 40 MG tablet   polyethylene glycol powder (GLYCOLAX/MIRALAX) 17 GM/SCOOP powder   Probiotic Product (PROBIOTIC GUMMIES PO)   rosuvastatin (CRESTOR) 40 MG tablet   triamcinolone (NASACORT) 55 MCG/ACT nasal inhaler   isosorbide mononitrate (IMDUR) 30 MG 24 hr tablet   metoprolol succinate (TOPROL-XL) 50 MG 24 hr tablet   PRESCRIPTION MEDICATION   No current facility-administered medications for this encounter.   Konrad Felix Ward, PA-C [facility] [phone]

## 2022-06-24 NOTE — Patient Instructions (Addendum)
Medication Instructions:   START TAKING: TOPROL XL 50 MG ONCE A DAY   STOP TAKING AND REMOVE THIS MEDICATION FROM YOUR MEDICATION LIST: AMLODIPINE 5 MG    *If you need a refill on your cardiac medications before your next appointment, please call your pharmacy*   Lab Work: TSH TODAY    If you have labs (blood work) drawn today and your tests are completely normal, you will receive your results only by: [place] (if you have MyChart) OR A paper copy in the mail If you have any lab test that is abnormal or we need to change your treatment, we will call you to review the results.   Testing/Procedures: NONE ORDERED  TODAY    Follow-Up: At [facility], you and your health needs are our priority.  As part of our continuing mission to provide you with exceptional heart care, we have created designated Provider Care Teams.  These Care Teams include your primary Cardiologist (physician) and Advanced Practice Providers (APPs -  Physician Assistants and Nurse Practitioners) who all work together to provide you with the care you need, when you need it.  We recommend signing up for the patient portal called "MyChart".  Sign up information is provided on this After Visit Summary.  MyChart is used to connect with patients for Virtual Visits (Telemedicine).  Patients are able to view lab/test results, encounter notes, upcoming appointments, etc.  Non-urgent messages can be sent to your provider as well.   To learn more about what you can do with MyChart, go to [email or web address].    Your next appointment:  You have been referred to Genetics Dr. Lattie Corns   AS SCHEDULED   The format for your next appointment:   In Person  Provider:   Allegra Lai, MD    Other Instructions   Important Information About Sugar

## 2022-06-24 NOTE — Telephone Encounter (Signed)
Rx sent to pt pharmacy 

## 2022-06-24 NOTE — Progress Notes (Signed)
   Subjective:    Patient ID: Barbara [name], female    DOB: September 09, 1941, 81 y.o.   MRN: [id number]  HPI Here to follow up a hospital stay from 06-09-22 to 06-13-22 for left ureteral stones. She presented with severe left sided pain and nausea. A scan revealed moderate left hydroureteronephrosis with several stones. She had a cystoscopy with retrieval of some of the stones and placement of a stent. She had mild AKI with her admission creatinine up to 2.49, but this was back to her baseline at 1.57 by DC. A culture of her urine was negative. Since going home she has been pain free. She is drinking lots of water as instructed. She saw her cardiologist recently for ankle swelling. They stopped her Amlodipine and increased her dose of Metoprolol. The swelling has since gone down.    Review of Systems  Constitutional: Negative.   Respiratory: Negative.    Cardiovascular:  Positive for leg swelling. Negative for chest pain and palpitations.  Gastrointestinal: Negative.   Genitourinary: Negative.        Objective:   Physical Exam Constitutional:      Appearance: Normal appearance.  Cardiovascular:     Rate and Rhythm: Normal rate and regular rhythm.     Pulses: Normal pulses.     Heart sounds: Normal heart sounds.  Pulmonary:     Effort: Pulmonary effort is normal.     Breath sounds: Normal breath sounds.  Abdominal:     Tenderness: There is no right CVA tenderness or left CVA tenderness.  Musculoskeletal:     Comments: Trace edema in both ankles   Neurological:     Mental Status: She is alert.           Assessment & Plan:  She is getting over some ureteral stones. She will have another cystoscopy tomorrow with Dr. Jeffie Pollock to remove the remaining stones and hopefully to remove the stent. Her AKI has resolved. There is no sign of infection. Her pain control is good. Her HTN is stable after the recent medication changes. We spent a total of ( 33  ) minutes reviewing records and  discussing these issues.  Alysia Penna, MD

## 2022-06-25 ENCOUNTER — Ambulatory Visit (HOSPITAL_BASED_OUTPATIENT_CLINIC_OR_DEPARTMENT_OTHER): Payer: Medicare Other | Admitting: Certified Registered"

## 2022-06-25 ENCOUNTER — Ambulatory Visit (HOSPITAL_COMMUNITY): Payer: Medicare Other | Admitting: Physician Assistant

## 2022-06-25 ENCOUNTER — Encounter (HOSPITAL_COMMUNITY): Payer: Self-pay | Admitting: Urology

## 2022-06-25 ENCOUNTER — Ambulatory Visit (HOSPITAL_COMMUNITY): Payer: Medicare Other

## 2022-06-25 ENCOUNTER — Observation Stay (HOSPITAL_COMMUNITY)
Admission: RE | Admit: 2022-06-25 | Discharge: 2022-06-25 | Disposition: A | Discharge status: Home / Self Care | Payer: Medicare Other | Attending: Urology | Admitting: Urology

## 2022-06-25 ENCOUNTER — Other Ambulatory Visit: Payer: Self-pay

## 2022-06-25 ENCOUNTER — Encounter (HOSPITAL_COMMUNITY)
Admission: RE | Disposition: A | Discharge status: Home / Self Care | Payer: Self-pay | Source: Home / Self Care | Attending: Urology

## 2022-06-25 DIAGNOSIS — Z9889 Other specified postprocedural states: Secondary | ICD-10-CM

## 2022-06-25 DIAGNOSIS — I252 Old myocardial infarction: Secondary | ICD-10-CM | POA: Diagnosis not present

## 2022-06-25 DIAGNOSIS — G4733 Obstructive sleep apnea (adult) (pediatric): Secondary | ICD-10-CM | POA: Diagnosis not present

## 2022-06-25 DIAGNOSIS — Z96611 Presence of right artificial shoulder joint: Secondary | ICD-10-CM | POA: Diagnosis not present

## 2022-06-25 DIAGNOSIS — I509 Heart failure, unspecified: Secondary | ICD-10-CM | POA: Insufficient documentation

## 2022-06-25 DIAGNOSIS — I130 Hypertensive heart and chronic kidney disease with heart failure and stage 1 through stage 4 chronic kidney disease, or unspecified chronic kidney disease: Secondary | ICD-10-CM

## 2022-06-25 DIAGNOSIS — Z9989 Dependence on other enabling machines and devices: Secondary | ICD-10-CM | POA: Diagnosis not present

## 2022-06-25 DIAGNOSIS — I5041 Acute combined systolic (congestive) and diastolic (congestive) heart failure: Secondary | ICD-10-CM

## 2022-06-25 DIAGNOSIS — D631 Anemia in chronic kidney disease: Secondary | ICD-10-CM

## 2022-06-25 DIAGNOSIS — I2510 Atherosclerotic heart disease of native coronary artery without angina pectoris: Secondary | ICD-10-CM | POA: Insufficient documentation

## 2022-06-25 DIAGNOSIS — Z87891 Personal history of nicotine dependence: Secondary | ICD-10-CM

## 2022-06-25 DIAGNOSIS — Z950 Presence of cardiac pacemaker: Secondary | ICD-10-CM | POA: Insufficient documentation

## 2022-06-25 DIAGNOSIS — N132 Hydronephrosis with renal and ureteral calculous obstruction: Principal | ICD-10-CM | POA: Insufficient documentation

## 2022-06-25 DIAGNOSIS — N200 Calculus of kidney: Secondary | ICD-10-CM

## 2022-06-25 DIAGNOSIS — I110 Hypertensive heart disease with heart failure: Secondary | ICD-10-CM | POA: Insufficient documentation

## 2022-06-25 DIAGNOSIS — N202 Calculus of kidney with calculus of ureter: Secondary | ICD-10-CM | POA: Diagnosis not present

## 2022-06-25 DIAGNOSIS — N201 Calculus of ureter: Secondary | ICD-10-CM

## 2022-06-25 DIAGNOSIS — N1832 Chronic kidney disease, stage 3b: Secondary | ICD-10-CM

## 2022-06-25 HISTORY — PX: CYSTOSCOPY/URETEROSCOPY/HOLMIUM LASER/STENT PLACEMENT: SHX6546

## 2022-06-25 SURGERY — CYSTOSCOPY/URETEROSCOPY/HOLMIUM LASER/STENT PLACEMENT
Anesthesia: General | Site: Ureter | Laterality: Left

## 2022-06-25 MED ORDER — ONDANSETRON HCL 4 MG/2ML IJ SOLN
INTRAMUSCULAR | Status: AC
  Filled 2022-06-25: qty 2

## 2022-06-25 MED ORDER — 0.9 % SODIUM CHLORIDE (POUR BTL) OPTIME
TOPICAL | Status: DC | PRN
  Administered 2022-06-25: 1000 mL

## 2022-06-25 MED ORDER — ORAL CARE MOUTH RINSE: 15.0000 mL | Freq: Once | OROMUCOSAL | Status: AC

## 2022-06-25 MED ORDER — CEFAZOLIN SODIUM-DEXTROSE 2-3 GM-%(50ML) IV SOLR
INTRAVENOUS | Status: DC | PRN
  Administered 2022-06-25: 2 g via INTRAVENOUS

## 2022-06-25 MED ORDER — FENTANYL CITRATE (PF) 100 MCG/2ML IJ SOLN
INTRAMUSCULAR | Status: AC
  Filled 2022-06-25: qty 2

## 2022-06-25 MED ORDER — FENTANYL CITRATE (PF) 100 MCG/2ML IJ SOLN
INTRAMUSCULAR | Status: DC | PRN
  Administered 2022-06-25 (×2): 25 ug via INTRAVENOUS
  Administered 2022-06-25: 50 ug via INTRAVENOUS

## 2022-06-25 MED ORDER — PROPOFOL 10 MG/ML IV BOLUS
INTRAVENOUS | Status: DC | PRN
  Administered 2022-06-25: 160 mg via INTRAVENOUS
  Administered 2022-06-25: 40 mg via INTRAVENOUS

## 2022-06-25 MED ORDER — PROMETHAZINE HCL 25 MG/ML IJ SOLN: 6.2500 mg | INTRAMUSCULAR | Status: DC | PRN

## 2022-06-25 MED ORDER — LACTATED RINGERS IV SOLN: INTRAVENOUS | Status: DC

## 2022-06-25 MED ORDER — HYDROMORPHONE HCL 1 MG/ML IJ SOLN: 0.2500 mg | INTRAMUSCULAR | Status: DC | PRN

## 2022-06-25 MED ORDER — DEXAMETHASONE SODIUM PHOSPHATE 10 MG/ML IJ SOLN
INTRAMUSCULAR | Status: AC
  Filled 2022-06-25: qty 1

## 2022-06-25 MED ORDER — OXYCODONE HCL 5 MG/5ML PO SOLN: 5.0000 mg | Freq: Once | ORAL | Status: DC | PRN

## 2022-06-25 MED ORDER — PROPOFOL 10 MG/ML IV BOLUS
INTRAVENOUS | Status: AC
  Filled 2022-06-25: qty 20

## 2022-06-25 MED ORDER — SODIUM CHLORIDE 0.9% FLUSH: 3.0000 mL | Freq: Two times a day (BID) | INTRAVENOUS | Status: DC

## 2022-06-25 MED ORDER — SODIUM CHLORIDE 0.9 % IR SOLN
Status: DC | PRN
  Administered 2022-06-25: 3000 mL

## 2022-06-25 MED ORDER — OXYCODONE HCL 5 MG PO TABS: 5.0000 mg | ORAL_TABLET | Freq: Once | ORAL | Status: DC | PRN

## 2022-06-25 MED ORDER — CHLORHEXIDINE GLUCONATE 0.12 % MT SOLN
15.0000 mL | Freq: Once | OROMUCOSAL | Status: AC
  Administered 2022-06-25: 15 mL via OROMUCOSAL

## 2022-06-25 SURGICAL SUPPLY — 23 items
BAG URO CATCHER STRL LF (MISCELLANEOUS) ×1 IMPLANT
BASKET STONE NCOMPASS (UROLOGICAL SUPPLIES) IMPLANT
CATH URETERAL DUAL LUMEN 10F (MISCELLANEOUS) IMPLANT
CATH URETL OPEN 5X70 (CATHETERS) IMPLANT
CLOTH BEACON ORANGE TIMEOUT ST (SAFETY) ×1 IMPLANT
EXTRACTOR STONE NITINOL NGAGE (UROLOGICAL SUPPLIES) IMPLANT
GLOVE SURG SS PI 8.0 STRL IVOR (GLOVE) ×1 IMPLANT
GOWN STRL REUS W/ TWL XL LVL3 (GOWN DISPOSABLE) ×1 IMPLANT
GOWN STRL REUS W/TWL XL LVL3 (GOWN DISPOSABLE) ×1
GUIDEWIRE STR DUAL SENSOR (WIRE) ×1 IMPLANT
IV NS IRRIG 3000ML ARTHROMATIC (IV SOLUTION) ×1 IMPLANT
KIT TURNOVER KIT A (KITS) IMPLANT
LASER FIB FLEXIVA PULSE ID 365 (Laser) IMPLANT
LASER FIB FLEXIVA PULSE ID 550 (Laser) IMPLANT
LASER FIB FLEXIVA PULSE ID 910 (Laser) IMPLANT
MANIFOLD NEPTUNE II (INSTRUMENTS) ×1 IMPLANT
PACK CYSTO (CUSTOM PROCEDURE TRAY) ×1 IMPLANT
SHEATH NAVIGATOR HD 11/13X36 (SHEATH) IMPLANT
STENT URET 6FRX24 CONTOUR (STENTS) IMPLANT
TRACTIP FLEXIVA PULS ID 200XHI (Laser) IMPLANT
TRACTIP FLEXIVA PULSE ID 200 (Laser) ×1
TUBING CONNECTING 10 (TUBING) ×1 IMPLANT
TUBING UROLOGY SET (TUBING) ×1 IMPLANT

## 2022-06-25 NOTE — Discharge Instructions (Signed)
The stone in the kidney was finely fragmented and in order to help the stone material pass, please try to spend time with the left side up while lying on your side.     I have taken to stones a removed to the office for analysis.  You have a string coming out of the urethra that is attached to the string.   I would like for that to stay in place until Monday morning if possible.  You may remove it yourself which is fairly easy or if you don't feel you can do that, please call the office to come have it removed.

## 2022-06-25 NOTE — Anesthesia Postprocedure Evaluation (Signed)
Anesthesia Post Note  Patient: Barbara [name]  Procedure(s) Performed: CYSTOSCOPY LEFT URETEROSCOPY/HOLMIUM LASER/STENT PLACEMENT (Left: Ureter)     Patient location during evaluation: PACU Anesthesia Type: General Level of consciousness: awake and alert Pain management: pain level controlled Vital Signs Assessment: post-procedure vital signs reviewed and stable Respiratory status: spontaneous breathing, nonlabored ventilation and respiratory function stable Cardiovascular status: blood pressure returned to baseline and stable Postop Assessment: no apparent nausea or vomiting Anesthetic complications: no   No notable events documented.  Last Vitals:  Vitals:   06/25/22 1600 06/25/22 1630  BP: (!) 160/71 (!) 150/65  Pulse: 60 60  Resp: 10 16  Temp:    SpO2: 93% 92%    Last Pain:  Vitals:   06/25/22 1557  TempSrc:   PainSc: 0-No pain                 Lynda Rainwater

## 2022-06-25 NOTE — Anesthesia Procedure Notes (Signed)
Procedure Name: LMA Insertion Date/Time: 06/25/2022 1:44 PM  Performed by: Raihana Balderrama D, CRNAPre-anesthesia Checklist: Patient identified, Emergency Drugs available, Suction available and Patient being monitored Patient Re-evaluated:Patient Re-evaluated prior to induction Oxygen Delivery Method: Circle system utilized Preoxygenation: Pre-oxygenation with 100% oxygen Induction Type: IV induction Ventilation: Mask ventilation without difficulty LMA: LMA inserted LMA Size: 4.0 Tube type: Oral Number of attempts: 1 Placement Confirmation: positive ETCO2 and breath sounds checked- equal and bilateral Tube secured with: Tape Dental Injury: Teeth and Oropharynx as per pre-operative assessment

## 2022-06-25 NOTE — Interval H&P Note (Signed)
History and Physical Interval Note:  She is doing well and is for definitive stone management today.   06/25/2022 1:19 PM  Barbara [name]  has presented today for surgery, with the diagnosis of LEFT URETERAL AND RENAL STONES.  The various methods of treatment have been discussed with the patient and family. After consideration of risks, benefits and other options for treatment, the patient has consented to  Procedure(s) with comments: CYSTOSCOPY LEFT URETEROSCOPY/HOLMIUM LASER/STENT PLACEMENT (Left) - 1 HR FOR THIS CASE as a surgical intervention.  The patient's history has been reviewed, patient examined, no change in status, stable for surgery.  I have reviewed the patient's chart and labs.  Questions were answered to the patient's satisfaction.     Irine Seal

## 2022-06-25 NOTE — Anesthesia Preprocedure Evaluation (Signed)
Anesthesia Evaluation  Patient identified by MRN, date of birth, ID band Patient awake    Reviewed: Allergy & Precautions, NPO status , Patient's Chart, lab work & pertinent test results  History of Anesthesia Complications (+) PONV and history of anesthetic complications  Airway Mallampati: III  TM Distance: >3 FB Neck ROM: Full    Dental  (+) Dental Advisory Given   Pulmonary shortness of breath, sleep apnea and Continuous Positive Airway Pressure Ventilation , pneumonia, former smoker,  New O2 requirement   Pulmonary exam normal breath sounds clear to auscultation       Cardiovascular hypertension, Pt. 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 Left ventricular ejection fraction, by estimation, is >75%. The left ventricle has hyperdynamic function. The left ventricle has no regional wall motion abnormalities. Left ventricular diastolic parameters are indeterminate. Elevated left atrial pressure.  2. Right ventricular systolic function is normal. The right ventricular size is normal. There is mildly elevated pulmonary artery systolic pressure.  3. Left atrial size was moderately dilated.  4. The mitral valve is normal in structure. Moderate mitral valve regurgitation. No evidence of mitral stenosis.  5. The aortic valve is tricuspid. Aortic valve regurgitation is not visualized. Aortic valve sclerosis is present, with no evidence of aortic valve stenosis.  6. The inferior vena cava is normal in size with greater than 50% respiratory variability, suggesting right atrial pressure of 3 mmHg.    10/08/2020; TTE 1. Left ventricular ejection fraction, by estimation, is 70 to 75%. The left ventricle has hyperdynamic function. The left ventricle has no  regional wall motion abnormalities. There is moderate eccentric left ventricular hypertrophy of the apical segment. Left ventricular diastolic parameters are indeterminate. Elevated left ventricular end-diastolic pressure.  2. Right ventricular systolic function is normal. The right ventricular size is normal. Tricuspid regurgitation signal is inadequate for assessing PA pressure.  3. The mitral valve is abnormal. Mild mitral valve regurgitation.  Moderate mitral annular calcification.  4. The aortic valve is grossly normal. There is mild calcification of the aortic valve. There is mild thickening of the aortic valve. Aortic valve regurgitation is not visualized. Mild aortic valve sclerosis is present,  with no evidence of aortic valve stenosis.    Neuro/Psych  Neuromuscular disease    GI/Hepatic GERD  ,  Endo/Other    Renal/GU CRFRenal diseaseLab Results      Component                Value               Date                      CREATININE               2.03 (H)            06/10/2022                BUN                      27 (H)              06/10/2022                NA  141                 06/10/2022                K                        3.8                 06/10/2022                   Musculoskeletal  (+) Arthritis , Osteoarthritis,    Abdominal   Peds  Hematology  (+) Blood dyscrasia, anemia , Lab Results      Component                Value               Date                      WBC                      11.1 (H)            06/10/2022                HGB                      11.7 (L)            06/10/2022                HCT                      37.8                06/10/2022                  PLT                      210                 06/10/2022              Anesthesia Other Findings All: Lidocaine, Procaine, Morphine, sulfa, norco, Vytorin,Tramadol  Reproductive/Obstetrics                             Anesthesia  Physical  Anesthesia Plan  ASA: 3  Anesthesia Plan: General   Post-op Pain Management:    Induction: Intravenous  PONV Risk Score and Plan: 4 or greater and Treatment may vary due to age or medical condition, Ondansetron, Dexamethasone, Midazolam and Droperidol  Airway Management Planned: LMA  Additional Equipment: None  Intra-op Plan:   Post-operative Plan: Extubation in OR  Informed Consent: I have reviewed the patients History and Physical, chart, labs and discussed the procedure including the risks, benefits and alternatives for the proposed anesthesia with the patient or authorized representative who has indicated his/her understanding and acceptance.     Dental advisory given  Plan Discussed with: CRNA  Anesthesia Plan Comments:         Anesthesia Quick Evaluation

## 2022-06-25 NOTE — Transfer of Care (Signed)
Immediate Anesthesia Transfer of Care Note  Patient: Barbara [name]  Procedure(s) Performed: CYSTOSCOPY LEFT URETEROSCOPY/HOLMIUM LASER/STENT PLACEMENT (Left: Ureter)  Patient Location: PACU  Anesthesia Type:General  Level of Consciousness: awake, alert  and oriented  Airway & Oxygen Therapy: Patient Spontanous Breathing and Patient connected to face mask oxygen  Post-op Assessment: Report given to RN and Post -op Vital signs reviewed and stable  Post vital signs: Reviewed and stable  Last Vitals:  Vitals Value Taken Time  BP 157/73 06/25/22 1438  Temp 36.4 C 06/25/22 1438  Pulse 60 06/25/22 1438  Resp 13 06/25/22 1438  SpO2 100 % 06/25/22 1438  Vitals shown include unvalidated device data.  Last Pain:  Vitals:   06/25/22 1246  TempSrc:   PainSc: 0-No pain         Complications: No notable events documented.

## 2022-06-25 NOTE — Op Note (Signed)
Procedure: 1.  Cystoscopy with left ureteroscopy, holmium laser application, stone extraction and stent exchange of a left renal stone. 2.  Left distal ureteral stone extraction. 3.  Application of fluoroscopy.  Preop diagnosis: Left distal and left lower pole renal stones.  Postop diagnosis: Same.  Surgeon: Dr. Irine Seal.  Anesthesia: General.  Specimen: Stone fragments.  Drain: 6 [place] by 24 cm left contour double-J stent with tether.  EBL: None.  Complications: None.  Indications: The patient is an 81 year old female with previously undergone stenting for a 5 mm 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al stone with obstruction and left lower pole renal stones.  She returns now for definitive management.  Procedure: She was given 2 g of Ancef.  A general anesthetic was induced.  She was placed in lithotomy position and fitted with PAS hose.  Her perineum and genitalia were prepped with Betadine solution and she was draped in usual sterile fashion.  Cystoscopy was performed using a 21 [place] scope and 30 degree lens.  Examination: Normal urethra.  There was a stent loop at the left ureteral orifice with some edema and moderate encrustation of the stent loop.  The right ureteral orifice was unremarkable.  The bladder wall was otherwise unremarkable.  The stent was grasped with a grasping forceps and pulled the urethral meatus.  An attempt was made to pass a sensor wire up the stent but it was too encrusted to pass so the stent was removed.  The cystoscope was then reinserted and a sensor wire was advanced the kidney under fluoroscopic guidance.  The dual-lumen semirigid short ureteroscope was then passed without difficulty alongside the wire and the stone was visualized.  It was grasped with an engage basket and removed in parts as it fragmented easily.  The short ureteroscope was removed and a 36 cm 11/13 digital access sheath was passed over the wire to the proximal ureter and the inner core and  wire removed.  A dual-lumen digital flexible scope was then passed to the kidney and the collecting system was inspected.  There were no stones in the upper and midpole calyces but there was a cluster of stones in the lower calyx that appeared to measure between 2 and 5 mm in size.  Several of the larger stones were grasped with the engage basket and removed but the stones were quite crumbly.  A 200 m laser fiber was then used with the holmium laser on the dusting setting and the stone was then reduced to sand and grit.  Once all significant reduced in size stones had been final inspection revealed nothing of sufficient size to remove with the basket.  The ureteroscope was removed and a sensor wire was replaced to the kidney under fluoroscopic guidance.  The access sheath was removed and a 6 [place] by 24 cm contour double-J stent was advanced to the kidney under fluoroscopic guidance.  The wire was removed, leaving a good coil in the kidney and a good coil in the bladder the stent string was left exiting the urethra.  The position of the distal coil was confirmed with the flexible ureteroscope and fluoroscopy and the bladder was drained with the cystoscope sheath.  The stent string was tied close to the meatus, trimmed to an appropriate length and tucked vaginally.  She was taken down from lithotomy position, her anesthetic was reversed and she was moved recovery in stable condition.  There were no complications

## 2022-06-26 ENCOUNTER — Encounter (HOSPITAL_COMMUNITY): Payer: Self-pay | Admitting: Urology

## 2022-06-26 ENCOUNTER — Telehealth: Payer: Self-pay | Admitting: Family Medicine

## 2022-06-26 DIAGNOSIS — N201 Calculus of ureter: Secondary | ICD-10-CM

## 2022-06-26 DIAGNOSIS — N200 Calculus of kidney: Secondary | ICD-10-CM

## 2022-06-26 NOTE — Telephone Encounter (Signed)
Left message for patient to call back and schedule Medicare Annual Wellness Visit (AWV) either virtually or in office. Left  my Barbara [name] number [phone]   Last AWV ;06/06/21  please schedule at anytime with [facility] Nurse Health Advisor 1 or 2

## 2022-06-26 NOTE — Discharge Summary (Signed)
Physician Discharge Summary  Patient ID: Barbara [name] MRN: [id number] DOB/AGE: August 01, 1941 81 y.o.  Admit date: 06/25/2022 Discharge date: 06/26/2022  Admission Diagnoses:  Ureteral stone  Discharge Diagnoses:  Principal Problem:   Ureteral stone Active Problems:   S/P cystoscopy   Renal stones   Past Medical History:  Diagnosis Date   A-fib [facility])    Allergy    CAD (coronary artery disease)    sees Dr. Shelva Majestic  cardiac stents - 2000   CHF (congestive heart failure) ([place])    Colon polyps    Complication of anesthesia    rash/hives with "caines"   Dyspnea    02/12/18 " when my heart gets out of rhythm, it has not been out of rhythym- since I have been on Tikosyn (11/2017)   Dysrhythmia    afib fib   GERD (gastroesophageal reflux disease)    takes OTC- Omeprazole- prn   Heart murmur    History of kidney stones    History of stress test    show normal perfusion without scar or ischemia, post EF 68%   Hx of echocardiogram    show an EF 55%-60% range with grade 1 diastolic dysfunction, she had mitral anular calcification with mild MR, moderate LA dilation and mild pulmonary hypertension with a PA estimated pressure of 41m   Hyperlipidemia    Hypertension    NSTEMI (non-ST elevated myocardial infarction) [place]    Osteoarthritis    Pneumonia    hx of 2015    PONV (postoperative nausea and vomiting)    Sleep apnea     Surgeries: Procedure(s): CYSTOSCOPY LEFT URETEROSCOPY/HOLMIUM LASER/STENT PLACEMENT on 06/25/2022   Consultants (if any):   Discharged Condition: Improved  Hospital Course: Barbara BERLANGAis an 81y.o. female who was admitted 06/25/2022 with a diagnosis of Ureteral stone and went to the operating room on 06/25/2022 and underwent the above named procedures.    She was given perioperative antibiotics:  Anti-infectives (From admission, onward)    None     .  She was given sequential compression devices  for DVT prophylaxis.  She  benefited maximally from the hospital stay and there were no complications.    Recent vital signs:  Vitals:   06/25/22 1630 06/25/22 1734  BP: (!) 150/65 (!) 150/69  Pulse: 60 61  Resp: 16   Temp:  98 F (36.7 C)  SpO2: 92% 95%    Recent laboratory studies:  Lab Results  Component Value Date   HGB 11.7 (L) 06/18/2022   HGB 11.9 (L) 06/12/2022   HGB 11.2 (L) 06/11/2022   Lab Results  Component Value Date   WBC 5.7 06/18/2022   PLT 241 06/18/2022   Lab Results  Component Value Date   INR 1.4 (H) 10/08/2020   Lab Results  Component Value Date   NA 137 06/18/2022   K 4.2 06/18/2022   CL 108 06/18/2022   CO2 23 06/18/2022   BUN 26 (H) 06/18/2022   CREATININE 1.57 (H) 06/18/2022   GLUCOSE 97 06/18/2022    Discharge Medications:   Allergies as of 06/25/2022       Reactions   Lidocaine Anaphylaxis   Morphine Other (See Comments)   "Body shuts down"   Procaine Hcl Anaphylaxis, Rash, Other (See Comments)   "Anything with 'caine' in it "   Sulfonamide Derivatives Hives   Norco [hydrocodone-acetaminophen] Nausea And Vomiting, Other (See Comments)   States does ok with IV form    Tramadol  Nausea Only   Vytorin [ezetimibe-simvastatin] Other (See Comments)   Unknown reaction    Tape Itching, Other (See Comments)   Patient prefers either paper tape or Coban wrap        Medication List     TAKE these medications    acetaminophen 500 MG tablet Commonly known as: TYLENOL Take 500 mg by mouth daily as needed for headache.   amiodarone 200 MG tablet Commonly known as: PACERONE Take 1 tablet (200 mg total) by mouth daily.   apixaban 2.5 MG Tabs tablet Commonly known as: Eliquis Take 1 tablet (2.5 mg total) by mouth 2 (two) times daily.   Aspirin Low Dose 81 MG chewable tablet Generic drug: aspirin Chew 1 tablet (81 mg total) by mouth daily.   dextromethorphan 30 MG/5ML liquid Commonly known as: DELSYM Take 15 mg by mouth daily as needed for cough.    docusate sodium 100 MG capsule Commonly known as: Colace Take 1 capsule (100 mg total) by mouth 2 (two) times daily.   furosemide 20 MG tablet Commonly known as: Lasix Take 1 tablet (20 mg total) by mouth daily.   hydrALAZINE 50 MG tablet Commonly known as: APRESOLINE Take 1 tablet (50 mg total) by mouth every 8 (eight) hours.   ipratropium 0.06 % nasal spray Commonly known as: ATROVENT Place 2 sprays into both nostrils 2 (two) times daily as needed (allergies).   isosorbide mononitrate 30 MG 24 hr tablet Commonly known as: IMDUR Take 1 tablet (30 mg total) by mouth daily.   loratadine 10 MG tablet Commonly known as: CLARITIN Take 10 mg by mouth daily as needed for allergies.   LORazepam 0.5 MG tablet Commonly known as: ATIVAN TAKE 1 TABLET BY MOUTH EVERY 6 HOURS AS NEEDED FOR ANXIETY   metoprolol succinate 50 MG 24 hr tablet Commonly known as: TOPROL-XL Take 1 tablet (50 mg total) by mouth daily.   multivitamin with minerals Tabs tablet Take 1 tablet by mouth daily.   nitroGLYCERIN 0.4 MG SL tablet Commonly known as: NITROSTAT DISSOLVE ONE TABLET UNDER THE TONGUE EVERY 5 MINUTES AS NEEDED FOR CHEST PAIN.  DO NOT EXCEED A TOTAL OF 3 DOSES IN 15 MINUTES   pantoprazole 40 MG tablet Commonly known as: PROTONIX Take 1 tablet (40 mg total) by mouth daily.   polyethylene glycol powder 17 GM/SCOOP powder Commonly known as: GLYCOLAX/MIRALAX Take 17 g by mouth daily.   PRESCRIPTION MEDICATION See admin instructions. CPAP- At bedtime   PROBIOTIC GUMMIES PO Take 2 tablets by mouth every morning.   rosuvastatin 40 MG tablet Commonly known as: CRESTOR Take 1 tablet (40 mg total) by mouth daily.   triamcinolone 55 MCG/ACT nasal inhaler Commonly known as: NASACORT Place 1 spray into both nostrils daily as needed (for allergies).   Vitamin D3 50 MCG (2000 UT) Tabs Take 2,000 Units by mouth daily.        Diagnostic Studies: DG C-Arm 1-60 Min-No Report  Result  Date: 06/25/2022 Fluoroscopy was utilized by the requesting physician.  No radiographic interpretation.   CUP PACEART INCLINIC DEVICE CHECK  Result Date: 06/24/2022 Pacemaker check in clinic. Normal device function. Thresholds, sensing, impedances consistent with previous measurements. Device programmed to maximize longevity. No mode switch or high ventricular rates noted. Device programmed at appropriate safety margins. Histogram distribution appropriate for patient activity level. Device programmed to optimize intrinsic conduction. Estimated longevity _12.4 years__. Patient enrolled in remote follow-up. Patient education completed. No P waves today at 40 Pacemaker check in clinic.  Normal device function. Thresholds, sensing, impedances consistent with previous measurements. Device programmed to maximize longevity. No mode switch or high ventricular rates noted. Device programmed at appropriate safety margins. Histogram distribution appropriate for patient activity level. Device programmed to optimize intrinsic conduction. Estimated longevity _12.4 years__. Patient enrolled in remote follow-up. Patient education completed. No P waves today at 40  DG C-Arm 1-60 Min-No Report  Result Date: 06/11/2022 Fluoroscopy was utilized by the requesting physician.  No radiographic interpretation.   CT Abdomen Pelvis W Contrast  Result Date: 06/09/2022 CLINICAL DATA:  Left lower quadrant abdominal pain with frequent bowel movements since early this morning. EXAM: CT ABDOMEN AND PELVIS WITH CONTRAST TECHNIQUE: Multidetector CT imaging of the abdomen and pelvis was performed using the standard protocol following bolus administration of intravenous contrast. RADIATION DOSE REDUCTION: This exam was performed according to the departmental dose-optimization program which includes automated exposure control, adjustment of the mA and/or kV according to patient size and/or use of iterative reconstruction technique. CONTRAST:   2m OMNIPAQUE IOHEXOL 300 MG/ML  SOLN COMPARISON:  Current chest radiograph. FINDINGS: Lower chest: Patchy ground-glass and confluent opacities lung bases associated with small effusions. Hepatobiliary: No focal liver abnormality is seen. No gallstones, gallbladder wall thickening, or biliary dilatation. Pancreas: Unremarkable. No pancreatic ductal dilatation or surrounding inflammatory changes. Spleen: Normal in size without focal abnormality. Adrenals/Urinary Tract: No adrenal masses. Moderate left hydronephrosis with significant left perinephric stranding/edema. Left ureter is dilated. There is a small stone in the mid ureter, and a second stone or stones in the distal ureter just below the pelvic brim, 5-6 mm in size. No right hydronephrosis. Nonobstructing stones noted in the left kidney, mostly in the lower pole. There are bilateral low-attenuation renal masses, largest on the right, mid to upper pole, 3.3 cm, all consistent with cysts. No follow-up recommended. Normal bladder. Stomach/Bowel: Stomach is within normal limits. Appendix appears normal. No evidence of bowel wall thickening, distention, or inflammatory changes. Vascular/Lymphatic: Aortic atherosclerosis. No aneurysm. No enlarged lymph nodes. Reproductive: Status post hysterectomy. No adnexal masses. Other: None. Musculoskeletal: No fracture or acute finding.  No bone lesion. IMPRESSION: 1. Left ureteral stones, 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, to 5 mm in size, leading to moderate left hydroureteronephrosis and significant left perinephric stranding/edema. 2. No other acute abnormality within the abdomen or pelvis. 3. Nonobstructing left intrarenal stones. 4. Small effusions with lung base opacities, which may reflect pulmonary edema. Consider infection if there are consistent clinical findings. 5. Aortic atherosclerosis. Electronically Signed   By: DLajean ManesM.D.   On: 06/09/2022 13:21   DG Chest Portable 1 View  Result Date: 06/09/2022 CLINICAL  DATA:  Hypoxia. EXAM: PORTABLE CHEST 1 VIEW COMPARISON:  05/09/2022 FINDINGS: 1142 hours. Rightward patient rotation. Or cardiomegaly diffuse interstitial and alveolar opacity is new in the interval with a slight parahilar predominance. Present pacemaker again noted. Telemetry leads overlie the chest. IMPRESSION: Cardiomegaly with pulmonary edema. Electronically Signed   By: EMisty StanleyM.D.   On: 06/09/2022 12:03    Disposition: Discharge disposition: 01-Home or Self Care          Follow-up Information     DHollace Hayward NP Follow up on 07/04/2022.   Why: 115p Contact information: [address] [id number]                  Signed: JIrine Seal8/23/2023, 7:21 AM

## 2022-06-27 ENCOUNTER — Telehealth: Payer: Self-pay | Admitting: Family Medicine

## 2022-06-27 MED ORDER — HYDRALAZINE HCL 50 MG PO TABS
50.0000 mg | ORAL_TABLET | Freq: Three times a day (TID) | ORAL | 5 refills | Status: DC
Start: 2022-06-27 — End: 2022-09-17

## 2022-06-27 NOTE — Telephone Encounter (Addendum)
Last refill-05-10-22-sent by historial provider Last OV-06/24/22  No future OV scheduled.   Please advise if okay.

## 2022-06-27 NOTE — Telephone Encounter (Signed)
Requesting a refill of hydrALAZINE (APRESOLINE) 50 MG tablet  was originally filled when discharged from [place], [place] Phone:  [phone]  Fax:  [phone]

## 2022-06-27 NOTE — Telephone Encounter (Signed)
Done

## 2022-06-28 DIAGNOSIS — I2510 Atherosclerotic heart disease of native coronary artery without angina pectoris: Secondary | ICD-10-CM | POA: Diagnosis not present

## 2022-06-28 DIAGNOSIS — I214 Non-ST elevation (NSTEMI) myocardial infarction: Secondary | ICD-10-CM | POA: Diagnosis not present

## 2022-06-28 DIAGNOSIS — I509 Heart failure, unspecified: Secondary | ICD-10-CM | POA: Diagnosis not present

## 2022-06-28 DIAGNOSIS — I130 Hypertensive heart and chronic kidney disease with heart failure and stage 1 through stage 4 chronic kidney disease, or unspecified chronic kidney disease: Secondary | ICD-10-CM | POA: Diagnosis not present

## 2022-06-28 DIAGNOSIS — N1831 Chronic kidney disease, stage 3a: Secondary | ICD-10-CM | POA: Diagnosis not present

## 2022-07-02 ENCOUNTER — Ambulatory Visit (INDEPENDENT_AMBULATORY_CARE_PROVIDER_SITE_OTHER): Payer: Medicare Other

## 2022-07-02 ENCOUNTER — Ambulatory Visit: Payer: Medicare Other | Admitting: Podiatry

## 2022-07-02 VITALS — Ht 67.5 in | Wt 174.0 lb

## 2022-07-02 DIAGNOSIS — Z0000 Encounter for general adult medical examination without abnormal findings: Secondary | ICD-10-CM

## 2022-07-02 NOTE — Progress Notes (Signed)
Subjective:   Barbara [name] is a 81 y.o. female who presents for Medicare Annual (Subsequent) preventive examination.  Review of Systems    Virtual Visit via Telephone Note  I connected with  Barbara [name] on 07/02/22 at 10:45 AM EDT by telephone and verified that I am speaking with the correct person using two identifiers.  Location: Patient: Home Provider: Office Persons participating in the virtual visit: patient/Nurse Health Advisor   I discussed the limitations, risks, security and privacy concerns of performing an evaluation and management service by telephone and the availability of in person appointments. The patient expressed understanding and agreed to proceed.  Interactive audio and video telecommunications were attempted between this nurse and patient, however failed, due to patient having technical difficulties OR patient did not have access to video capability.  We continued and completed visit with audio only.  Some vital signs may be absent or patient reported.   Barbara Peaches, LPN  Cardiac Risk Factors include: advanced age (>30mn, >>37women);hypertension;Other (see comment), Risk factor comments: CAD     Objective:    Today's Vitals   07/02/22 1056  Weight: 174 lb (78.9 kg)  Height: 5' 7.5" (1.715 m)  PainSc: 0-No pain   Body mass index is 26.85 kg/m.     07/02/2022   11:07 AM 06/25/2022   12:32 PM 06/18/2022    1:21 PM 06/11/2022    2:39 PM 06/09/2022   10:02 AM 05/03/2022    3:35 PM 05/03/2022   11:29 AM  Advanced Directives  Does Patient Have a Medical Advance Directive? Yes Yes Yes No No Yes No  Type of Barbara [name] will Barbara [name] will    Barbara [name] will   Does patient want to make changes to medical advance directive? No - Patient declined  No - Patient declined   No - Patient declined   Copy of [place] Chart? No -  copy requested     No - copy requested   Would patient like information on creating a medical advance directive?    No - Patient declined No - Patient declined No - Patient declined No - Patient declined    Current Medications (verified) Outpatient Encounter Medications as of 07/02/2022  Medication Sig   acetaminophen (TYLENOL) 500 MG tablet Take 500 mg by mouth daily as needed for headache.   amiodarone (PACERONE) 200 MG tablet Take 1 tablet (200 mg total) by mouth daily.   apixaban (ELIQUIS) 2.5 MG TABS tablet Take 1 tablet (2.5 mg total) by mouth 2 (two) times daily.   aspirin 81 MG chewable tablet Chew 1 tablet (81 mg total) by mouth daily.   Cholecalciferol (VITAMIN D3) 2000 units TABS Take 2,000 Units by mouth daily.   dextromethorphan (DELSYM) 30 MG/5ML liquid Take 15 mg by mouth daily as needed for cough.   docusate sodium (COLACE) 100 MG capsule Take 1 capsule (100 mg total) by mouth 2 (two) times daily.   furosemide (LASIX) 20 MG tablet Take 1 tablet (20 mg total) by mouth daily.   hydrALAZINE (APRESOLINE) 50 MG tablet Take 1 tablet (50 mg total) by mouth every 8 (eight) hours.   ipratropium (ATROVENT) 0.06 % nasal spray Place 2 sprays into both nostrils 2 (two) times daily as needed (allergies).   isosorbide mononitrate (IMDUR) 30 MG 24 hr tablet Take 1 tablet (30 mg total) by mouth daily.   loratadine (CLARITIN) 10 MG  tablet Take 10 mg by mouth daily as needed for allergies.   LORazepam (ATIVAN) 0.5 MG tablet TAKE 1 TABLET BY MOUTH EVERY 6 HOURS AS NEEDED FOR ANXIETY   metoprolol succinate (TOPROL-XL) 50 MG 24 hr tablet Take 1 tablet (50 mg total) by mouth daily.   Multiple Vitamin (MULTIVITAMIN WITH MINERALS) TABS tablet Take 1 tablet by mouth daily.   nitroGLYCERIN (NITROSTAT) 0.4 MG SL tablet DISSOLVE ONE TABLET UNDER THE TONGUE EVERY 5 MINUTES AS NEEDED FOR CHEST PAIN.  DO NOT EXCEED A TOTAL OF 3 DOSES IN 15 MINUTES   pantoprazole (PROTONIX) 40 MG tablet Take 1 tablet (40 mg  total) by mouth daily.   polyethylene glycol powder (GLYCOLAX/MIRALAX) 17 GM/SCOOP powder Take 17 g by mouth daily.   PRESCRIPTION MEDICATION See admin instructions. CPAP- At bedtime   Probiotic Product (PROBIOTIC GUMMIES PO) Take 2 tablets by mouth every morning.   rosuvastatin (CRESTOR) 40 MG tablet Take 1 tablet (40 mg total) by mouth daily.   triamcinolone (NASACORT) 55 MCG/ACT nasal inhaler Place 1 spray into both nostrils daily as needed (for allergies).    No facility-administered encounter medications on file as of 07/02/2022.    Allergies (verified) Lidocaine, Morphine, Procaine hcl, Sulfonamide derivatives, Amlodipine, Norco [hydrocodone-acetaminophen], Tramadol, Vytorin [ezetimibe-simvastatin], and Tape   History: Past Medical History:  Diagnosis Date   A-fib (Barbara [name])    Allergy    CAD (coronary artery disease)    sees Dr. Shelva [name]  cardiac stents - 2000   CHF (congestive heart failure) (Barbara [name])    Colon polyps    Complication of anesthesia    rash/hives with "caines"   Dyspnea    02/12/18 " when my heart gets out of rhythm, it has not been out of rhythym- since I have been on Tikosyn (11/2017)   Dysrhythmia    afib fib   GERD (gastroesophageal reflux disease)    takes OTC- Omeprazole- prn   Heart murmur    History of kidney stones    History of stress test    show normal perfusion without scar or ischemia, post EF 68%   Hx of echocardiogram    show an EF 55%-60% range with grade 1 diastolic dysfunction, she had mitral anular calcification with mild MR, moderate LA dilation and mild pulmonary hypertension with a PA estimated pressure of 62m   Hyperlipidemia    Hypertension    NSTEMI (non-ST elevated myocardial infarction) (Barbara [name]    Osteoarthritis    Pneumonia    hx of 2015    PONV (postoperative nausea and vomiting)    Sleep apnea    Past Surgical History:  Procedure Laterality Date   CARDIAC CATHETERIZATION     11/2017   cardiac stents  2000   COLONOSCOPY   01-05-14   per Dr. JTeena [name] clear, no repeats needed    COLONOSCOPY     CORONARY STENT PLACEMENT  2000   in Barbara [name] 06/11/2022   Procedure: CYSTOSCOPY WITH RETROGRADE PYELOGRAM, [name] STENT PLACEMENT;  Surgeon: Barbara Mallow MD;  Location: [facility];  Service: Urology;  Laterality: [name];   CYSTOSCOPY/URETEROSCOPY/HOLMIUM LASER/STENT PLACEMENT [name] 06/25/2022   Procedure: CYSTOSCOPY [name] URETEROSCOPY/HOLMIUM LASER/STENT PLACEMENT;  Surgeon: WIrine Seal MD;  Location: [facility];  Service: Urology;  Laterality: [name];  1 HR FOR THIS CASE   DIRECT LARYNGOSCOPY WITH RADIAESSE INJECTION N/A 02/13/2018   Procedure: DIRECT LARYNGOSCOPY WITH RADIAESSE INJECTION;  Surgeon: BMelida Quitter MD;  Location: [facility];  Service: ENT;  Laterality: N/A;   EYE SURGERY [name]    CATARACT REMOVAL   KNEE ARTHROSCOPY [name] 01/06/2015   Procedure: [name] KNEE ARTHROSCOPY, abrasion chondroplasty of the medial femerol condryl,medial and lateral menisectomy, microfracture , synovectomy of the suprpatellar pouch;  Surgeon: Latanya Maudlin, MD;  Location: [facility];  Service: Orthopedics;  Laterality: [name];   [name] HEART CATH AND CORONARY ANGIOGRAPHY N/A 02/26/2019   Procedure: [name] HEART CATH AND CORONARY ANGIOGRAPHY;  Surgeon: Troy Sine, MD;  Location: [place] [facility];  Service: Cardiovascular;  Laterality: N/A;   [name] HEART CATH AND CORONARY ANGIOGRAPHY N/A 05/06/2022   Procedure: [name] HEART CATH AND CORONARY ANGIOGRAPHY;  Surgeon: Nelva Bush, MD;  Location: [place] [facility];  Service: Cardiovascular;  Laterality: N/A;   MICROLARYNGOSCOPY W/VOCAL CORD INJECTION N/A 08/07/2018   Procedure: MICROLARYNGOSCOPY WITH VOCAL CORD INJECTION OF PROLARYN;  Surgeon: Melida Quitter, MD;  Location: [place];  Service: ENT;  Laterality: N/A;  JET VENTILATION   PACEMAKER IMPLANT N/A 05/08/2022   Procedure: PACEMAKER IMPLANT;  Surgeon: Constance Haw, MD;  Location: [place] [facility];  Service: Cardiovascular;  Laterality: N/A;   REVERSE SHOULDER ARTHROPLASTY Right 03/23/2020   Procedure: REVERSE SHOULDER ARTHROPLASTY;  Surgeon: Nicholes Stairs, MD;  Location: [place];  Service: Orthopedics;  Laterality: Right;   VAGINAL HYSTERECTOMY  1971   Family History  Problem Relation Age of Onset   Cancer Maternal Grandmother    Heart disease Maternal Grandfather    Social History   Socioeconomic History   Marital status: Married    Spouse name: Not on file   Number of children: 2   Years of education: Not on file   Highest education level: Not on file  Occupational History   Not on file  Tobacco Use   Smoking status: Former    Years: 40.00    Types: Cigarettes    Quit date: 11/04/1996    Years since quitting: 25.6   Smokeless tobacco: Never  Vaping Use   Vaping Use: Never used  Substance and Sexual Activity   Alcohol use: No    Alcohol/week: 0.0 standard drinks of alcohol   Drug use: No   Sexual activity: Not Currently  Other Topics Concern   Not on file  Social History Narrative   Not on file   Social Determinants of Health   Financial Resource Strain: Low Risk  (07/02/2022)   Overall Financial Resource Strain (CARDIA)    Difficulty of Paying Living Expenses: Not hard at all  Food Insecurity: No Food Insecurity (07/02/2022)   Hunger Vital Sign    Worried About Running Out of Food in the Last Year: Never true    [place] in the Last Year: Never true  Transportation Needs: No Transportation Needs (07/02/2022)   PRAPARE - Hydrologist (Medical): No    Lack of Transportation (Non-Medical): No  Physical Activity: Insufficiently Active (07/02/2022)   Exercise Vital Sign    Days of Exercise per Week: 1 day    Minutes of Exercise per Session: 20 min  Stress: No Stress Concern Present (07/02/2022)   [place]    Feeling of Stress : Not at all   Social Connections: Moderately Integrated (07/02/2022)   Social Connection and Isolation Panel [NHANES]    Frequency of Communication with Friends and Family: More than three times a week    Frequency  of Social Gatherings with Friends and Family: More than three times a week    Attends Religious Services: More than 4 times per year    Active Member of [employer] or Organizations: Yes    Attends Archivist Meetings: More than 4 times per year    Marital Status: Widowed    Tobacco Counseling Counseling given: Not Answered   Clinical Intake:  Pre-visit preparation completed: No  Pain : No/denies pain Pain Score: 0-No pain     BMI - recorded: 27.45 Nutritional Status: BMI 25 -29 Overweight Nutritional Risks: None Diabetes: No  How often do you need to have someone help you when you read instructions, pamphlets, or other written materials from your doctor or pharmacy?: 1 - Never  Diabetic?  No  Interpreter Needed?: No  Information entered by :: Rolene Arbour LPN   Activities of Daily Living    07/02/2022   11:04 AM 06/18/2022    1:29 PM  In your present state of health, do you have any difficulty performing the following activities:  Hearing? 0   Vision? 0   Difficulty concentrating or making decisions? 0   Walking or climbing stairs? 0   Dressing or bathing? 0   Doing errands, shopping? 0 0  Preparing Food and eating ? N   Using the Toilet? N   In the past six months, have you accidently leaked urine? N   Do you have problems with loss of bowel control? N   Managing your Medications? N   Managing your Finances? N   Housekeeping or managing your Housekeeping? N     Patient Care Team: Laurey Morale, MD as PCP - General Claiborne Billings Joyice Faster, MD as PCP - Cardiology (Cardiology) Constance Haw, MD as PCP - Electrophysiology (Cardiology)  Indicate any recent Medical Services you may have received from other than [facility] providers in the past year (date may be  approximate).     Assessment:   This is a routine wellness examination for Arlena.  Hearing/Vision screen Hearing Screening - Comments:: Denies hearing difficulties   Vision Screening - Comments:: Wears rx glasses - up to date with routine eye exams with    Dietary issues and exercise activities discussed: Current Exercise Habits: Structured exercise class, Type of exercise: Other - see comments (PT assist), Time (Minutes): 20, Frequency (Times/Week): 1, Weekly Exercise (Minutes/Week): 20, Intensity: Mild, Exercise limited by: None identified   Goals Addressed               This Visit's Progress     No current goals (pt-stated)        I want to stay healthy.       Depression Screen    07/02/2022   11:02 AM 06/24/2022    4:23 PM 01/08/2022   11:25 AM 06/06/2021    3:30 PM 11/19/2018   10:44 AM 04/25/2016   10:47 AM  PHQ 2/9 Scores  PHQ - 2 Score 0 0 0 0 0 0  PHQ- 9 Score 0 3 6       Fall Risk    07/02/2022   11:06 AM 06/24/2022    4:22 PM 06/24/2022    1:52 PM 01/08/2022   11:26 AM 06/13/2021    2:30 PM  [place] in the past year? 0 0 0 0 0  Number falls in past yr: 0 0 0 0 0  Injury with Fall? 0 0 0 0 0  Risk for fall due  to : No Fall Risks History of fall(s) No Fall Risks No Fall Risks No Fall Risks  Follow up Falls prevention discussed Falls evaluation completed Falls evaluation completed      [place]:  Any stairs in or around the home? Yes  If so, are there any without handrails? No  Home free of loose throw rugs in walkways, pet beds, electrical cords, etc? Yes  Adequate lighting in your home to reduce risk of falls? Yes   ASSISTIVE DEVICES UTILIZED TO PREVENT FALLS:  Life alert? Yes  Use of a cane, walker or w/c? Yes  Grab bars in the bathroom? No  Shower chair or bench in shower? Yes  Elevated toilet seat or a handicapped toilet? Yes   TIMED UP AND GO:  Was the test performed? No . Audio  Visit    Cognitive Function:        07/02/2022   11:08 AM 06/06/2021    3:37 PM  6CIT Screen  What Year? 0 points 0 points  What month? 0 points 0 points  What time? 0 points 0 points  Count back from 20 0 points 0 points  Months in reverse 0 points 0 points  Repeat phrase 0 points 0 points  Total Score 0 points 0 points    Immunizations Immunization History  Administered Date(s) Administered   Fluad Quad(high Dose 65+) 08/04/2019, 10/31/2020, 08/27/2021   Influenza Split 10/25/2011, 10/21/2012   Influenza Whole 10/10/2009, 10/17/2010   Influenza, High Dose Seasonal PF 08/02/2015, 09/04/2016, 08/14/2017, 08/03/2018   Influenza,inj,Quad PF,6+ Mos 07/12/2013, 08/24/2014   Influenza-Unspecified 08/04/2014, 08/07/2015   PFIZER(Purple Top)SARS-COV-2 Vaccination 12/26/2019, 01/19/2020, 03/05/2021   Pneumococcal Conjugate-13 09/04/2015   Pneumococcal Polysaccharide-23 10/10/2009   Pneumococcal-Unspecified 08/07/2015   Tdap 11/06/2017    TDAP status: Up to date  Flu Vaccine status: Up to date  Pneumococcal vaccine status: Up to date  Covid-19 vaccine status: Completed vaccines  Qualifies for Shingles Vaccine? Yes   Zostavax completed No   Shingrix Completed?: No.    Education has been provided regarding the importance of this vaccine. Patient has been advised to call insurance company to determine out of pocket expense if they have not yet received this vaccine. Advised may also receive vaccine at local pharmacy or Health Dept. Verbalized acceptance and understanding.  Screening Tests Health Maintenance  Topic Date Due   INFLUENZA VACCINE  06/04/2022   COVID-19 Vaccine (4 - Pfizer risk series) 07/04/2022 (Originally 04/30/2021)   Zoster Vaccines- Shingrix (1 of 2) 10/02/2022 (Originally 03/16/1960)   TETANUS/TDAP  11/07/2027   Pneumonia Vaccine 41+ Years old  Completed   DEXA SCAN  Completed   HPV VACCINES  Aged Out    Health Maintenance  Health Maintenance Due   Topic Date Due   INFLUENZA VACCINE  06/04/2022    Colorectal cancer screening: No longer required.   Mammogram status: No longer required due to Age.  Bone Density status: Completed 09/24/21. Results reflect: Bone density results: OSTEOPOROSIS. Repeat every   years.  Lung Cancer Screening: (Low Dose CT Chest recommended if Age 60-80 years, 30 pack-year currently smoking OR have quit w/in 15years.) does not qualify.    Additional Screening:  Hepatitis C Screening: does not qualify; Completed    Vision Screening: Recommended annual ophthalmology exams for early detection of glaucoma and other disorders of the eye. Is the patient up to date with their annual eye exam?  No  Who is the provider or what is the  name of the office in which the patient attends annual eye exams? Dr Herbert Deaner If pt is not established with a provider, would they like to be referred to a provider to establish care? No .   Dental Screening: Recommended annual dental exams for proper oral hygiene  Community Resource Referral / Chronic Care Management:   CRR required this visit?  No   CCM required this visit?  No      Plan:     I have personally reviewed and noted the following in the patient's chart:   Medical and social history Use of alcohol, tobacco or illicit drugs  Current medications and supplements including opioid prescriptions. Patient is not currently taking opioid prescriptions. Functional ability and status Nutritional status Physical activity Advanced directives List of other physicians Hospitalizations, surgeries, and ER visits in previous 12 months Vitals Screenings to include cognitive, depression, and falls Referrals and appointments  In addition, I have reviewed and discussed with patient certain preventive protocols, quality metrics, and best practice recommendations. A written personalized care plan for preventive services as well as general preventive health recommendations  were provided to patient.     Barbara Peaches, LPN   07/20/3845   Nurse Notes: None

## 2022-07-02 NOTE — Patient Instructions (Addendum)
Barbara [name] , Thank you for taking time to come for your Medicare Wellness Visit. I appreciate your ongoing commitment to your health goals. Please review the following plan we discussed and let me know if I can assist you in the future.   Screening recommendations/referrals: Colonoscopy: No longer required Mammogram: No longer required Bone Density: Done 09/24/21 Recommended yearly ophthalmology/optometry visit for glaucoma screening and checkup Recommended yearly dental visit for hygiene and checkup  Vaccinations: Influenza vaccine: Up to date Pneumococcal vaccine: Up to date Tdap vaccine: Up to date Shingles vaccine: Deferred   Covid-19:Done  Advanced directives: Please bring a copy of your health care power of attorney and living will to the office to be added to your chart at your convenience.   Conditions/risks identified: None  Next appointment: Follow up in one year for your annual wellness visit     Preventive Care 65 Years and Older, Female Preventive care refers to lifestyle choices and visits with your health care provider that can promote health and wellness. What does preventive care include? A yearly physical exam. This is also called an annual well check. Dental exams once or twice a year. Routine eye exams. Ask your health care provider how often you should have your eyes checked. Personal lifestyle choices, including: Daily care of your teeth and gums. Regular physical activity. Eating a healthy diet. Avoiding tobacco and drug use. Limiting alcohol use. Practicing safe sex. Taking low-dose aspirin every day. Taking vitamin and mineral supplements as recommended by your health care provider. What happens during an annual well check? The services and screenings done by your health care provider during your annual well check will depend on your age, overall health, lifestyle risk factors, and family history of disease. Counseling  Your health care provider  may ask you questions about your: Alcohol use. Tobacco use. Drug use. Emotional well-being. Home and relationship well-being. Sexual activity. Eating habits. History of falls. Memory and ability to understand (cognition). Work and work Statistician. Reproductive health. Screening  You may have the following tests or measurements: Height, weight, and BMI. Blood pressure. Lipid and cholesterol levels. These may be checked every 5 years, or more frequently if you are over 19 years old. Skin check. Lung cancer screening. You may have this screening every year starting at age 76 if you have a 30-pack-year history of smoking and currently smoke or have quit within the past 15 years. Fecal occult blood test (FOBT) of the stool. You may have this test every year starting at age 38. Flexible sigmoidoscopy or colonoscopy. You may have a sigmoidoscopy every 5 years or a colonoscopy every 10 years starting at age 68. Hepatitis C blood test. Hepatitis B blood test. Sexually transmitted disease (STD) testing. Diabetes screening. This is done by checking your blood sugar (glucose) after you have not eaten for a while (fasting). You may have this done every 1-3 years. Bone density scan. This is done to screen for osteoporosis. You may have this done starting at age 24. Mammogram. This may be done every 1-2 years. Talk to your health care provider about how often you should have regular mammograms. Talk with your health care provider about your test results, treatment options, and if necessary, the need for more tests. Vaccines  Your health care provider may recommend certain vaccines, such as: Influenza vaccine. This is recommended every year. Tetanus, diphtheria, and acellular pertussis (Tdap, Td) vaccine. You may need a Td booster every 10 years. Zoster vaccine. You may need this  after age 8. Pneumococcal 13-valent conjugate (PCV13) vaccine. One dose is recommended after age 16. Pneumococcal  polysaccharide (PPSV23) vaccine. One dose is recommended after age 47. Talk to your health care provider about which screenings and vaccines you need and how often you need them. This information is not intended to replace advice given to you by your health care provider. Make sure you discuss any questions you have with your health care provider. Document Released: 11/17/2015 Document Revised: 07/10/2016 Document Reviewed: 08/22/2015 Elsevier Interactive Patient Education  2017 [place] Prevention in the Home Falls can cause injuries. They can happen to people of all ages. There are many things you can do to make your home safe and to help prevent falls. What can I do on the outside of my home? Regularly fix the edges of walkways and driveways and fix any cracks. Remove anything that might make you trip as you walk through a door, such as a raised step or threshold. Trim any bushes or trees on the path to your home. Use bright outdoor lighting. Clear any walking paths of anything that might make someone trip, such as rocks or tools. Regularly check to see if handrails are loose or broken. Make sure that both sides of any steps have handrails. Any raised decks and porches should have guardrails on the edges. Have any leaves, snow, or ice cleared regularly. Use sand or salt on walking paths during winter. Clean up any spills in your garage right away. This includes oil or grease spills. What can I do in the bathroom? Use night lights. Install grab bars by the toilet and in the tub and shower. Do not use towel bars as grab bars. Use non-skid mats or decals in the tub or shower. If you need to sit down in the shower, use a plastic, non-slip stool. Keep the floor dry. Clean up any water that spills on the floor as soon as it happens. Remove soap buildup in the tub or shower regularly. Attach bath mats securely with double-sided non-slip rug tape. Do not have throw rugs and other  things on the floor that can make you trip. What can I do in the bedroom? Use night lights. Make sure that you have a light by your bed that is easy to reach. Do not use any sheets or blankets that are too big for your bed. They should not hang down onto the floor. Have a firm chair that has side arms. You can use this for support while you get dressed. Do not have throw rugs and other things on the floor that can make you trip. What can I do in the kitchen? Clean up any spills right away. Avoid walking on wet floors. Keep items that you use a lot in easy-to-reach places. If you need to reach something above you, use a strong step stool that has a grab bar. Keep electrical cords out of the way. Do not use floor polish or wax that makes floors slippery. If you must use wax, use non-skid floor wax. Do not have throw rugs and other things on the floor that can make you trip. What can I do with my stairs? Do not leave any items on the stairs. Make sure that there are handrails on both sides of the stairs and use them. Fix handrails that are broken or loose. Make sure that handrails are as long as the stairways. Check any carpeting to make sure that it is firmly attached to the  stairs. Fix any carpet that is loose or worn. Avoid having throw rugs at the top or bottom of the stairs. If you do have throw rugs, attach them to the floor with carpet tape. Make sure that you have a light switch at the top of the stairs and the bottom of the stairs. If you do not have them, ask someone to add them for you. What else can I do to help prevent falls? Wear shoes that: Do not have high heels. Have rubber bottoms. Are comfortable and fit you well. Are closed at the toe. Do not wear sandals. If you use a stepladder: Make sure that it is fully opened. Do not climb a closed stepladder. Make sure that both sides of the stepladder are locked into place. Ask someone to hold it for you, if possible. Clearly  mark and make sure that you can see: Any grab bars or handrails. First and last steps. Where the edge of each step is. Use tools that help you move around (mobility aids) if they are needed. These include: Canes. Walkers. Scooters. Crutches. Turn on the lights when you go into a dark area. Replace any light bulbs as soon as they burn out. Set up your furniture so you have a clear path. Avoid moving your furniture around. If any of your floors are uneven, fix them. If there are any pets around you, be aware of where they are. Review your medicines with your doctor. Some medicines can make you feel dizzy. This can increase your chance of falling. Ask your doctor what other things that you can do to help prevent falls. This information is not intended to replace advice given to you by your health care provider. Make sure you discuss any questions you have with your health care provider. Document Released: 08/17/2009 Document Revised: 03/28/2016 Document Reviewed: 11/25/2014 Elsevier Interactive Patient Education  2017 [employer].

## 2022-07-04 DIAGNOSIS — R8271 Bacteriuria: Secondary | ICD-10-CM | POA: Diagnosis not present

## 2022-07-04 DIAGNOSIS — N202 Calculus of kidney with calculus of ureter: Secondary | ICD-10-CM | POA: Diagnosis not present

## 2022-07-05 DIAGNOSIS — I2510 Atherosclerotic heart disease of native coronary artery without angina pectoris: Secondary | ICD-10-CM | POA: Diagnosis not present

## 2022-07-05 DIAGNOSIS — I130 Hypertensive heart and chronic kidney disease with heart failure and stage 1 through stage 4 chronic kidney disease, or unspecified chronic kidney disease: Secondary | ICD-10-CM | POA: Diagnosis not present

## 2022-07-05 DIAGNOSIS — I509 Heart failure, unspecified: Secondary | ICD-10-CM | POA: Diagnosis not present

## 2022-07-05 DIAGNOSIS — N1831 Chronic kidney disease, stage 3a: Secondary | ICD-10-CM | POA: Diagnosis not present

## 2022-07-05 DIAGNOSIS — I214 Non-ST elevation (NSTEMI) myocardial infarction: Secondary | ICD-10-CM | POA: Diagnosis not present

## 2022-07-06 DIAGNOSIS — I2510 Atherosclerotic heart disease of native coronary artery without angina pectoris: Secondary | ICD-10-CM | POA: Diagnosis not present

## 2022-07-06 DIAGNOSIS — I214 Non-ST elevation (NSTEMI) myocardial infarction: Secondary | ICD-10-CM | POA: Diagnosis not present

## 2022-07-06 DIAGNOSIS — I130 Hypertensive heart and chronic kidney disease with heart failure and stage 1 through stage 4 chronic kidney disease, or unspecified chronic kidney disease: Secondary | ICD-10-CM | POA: Diagnosis not present

## 2022-07-06 DIAGNOSIS — N1831 Chronic kidney disease, stage 3a: Secondary | ICD-10-CM | POA: Diagnosis not present

## 2022-07-06 DIAGNOSIS — I509 Heart failure, unspecified: Secondary | ICD-10-CM | POA: Diagnosis not present

## 2022-07-10 ENCOUNTER — Telehealth: Payer: Self-pay | Admitting: Family Medicine

## 2022-07-10 DIAGNOSIS — I2510 Atherosclerotic heart disease of native coronary artery without angina pectoris: Secondary | ICD-10-CM | POA: Diagnosis not present

## 2022-07-10 DIAGNOSIS — N1831 Chronic kidney disease, stage 3a: Secondary | ICD-10-CM | POA: Diagnosis not present

## 2022-07-10 DIAGNOSIS — I509 Heart failure, unspecified: Secondary | ICD-10-CM | POA: Diagnosis not present

## 2022-07-10 DIAGNOSIS — I130 Hypertensive heart and chronic kidney disease with heart failure and stage 1 through stage 4 chronic kidney disease, or unspecified chronic kidney disease: Secondary | ICD-10-CM | POA: Diagnosis not present

## 2022-07-10 DIAGNOSIS — I214 Non-ST elevation (NSTEMI) myocardial infarction: Secondary | ICD-10-CM | POA: Diagnosis not present

## 2022-07-10 NOTE — Telephone Encounter (Signed)
Pt called to ask if MD could complete a form she was given regarding her prescription of apixaban (ELIQUIS) 2.5 MG TABS tablet?  She received this form from [facility].   She stated she thinks the heart doctor gave her this prescription, but when she called they told her she had to call her PCP.  Please advise.  [phone]

## 2022-07-12 NOTE — Telephone Encounter (Addendum)
Spoke with patient about message.  No refill's on Eliquis needed at this time.     She stated she will drop off form asap.

## 2022-07-12 NOTE — Telephone Encounter (Signed)
Yes we can take care of this

## 2022-07-15 ENCOUNTER — Telehealth: Payer: Self-pay | Admitting: Family Medicine

## 2022-07-15 NOTE — Telephone Encounter (Signed)
Pt yellow envelope was received and placed on Dr Rubbie Battiest folder

## 2022-07-15 NOTE — Telephone Encounter (Signed)
Patient dropped off paperwork that needs to be filled out by Dr.Fry. Patient would like it faxed to the number [phone]. Placed in folder for completion.     Please advise

## 2022-07-18 NOTE — Telephone Encounter (Signed)
Pt paperwork was faxed, pt notified to pick up paperwork form the office. Yellow folder placed in the front office file cabinet

## 2022-07-25 ENCOUNTER — Telehealth: Payer: Self-pay | Admitting: Family Medicine

## 2022-07-25 NOTE — Telephone Encounter (Signed)
Orders were faxed in on 07/24/2022 and requestng signature and fax back to [phone]

## 2022-07-28 ENCOUNTER — Other Ambulatory Visit: Payer: Self-pay | Admitting: Family Medicine

## 2022-08-01 NOTE — Telephone Encounter (Signed)
These forms are ready

## 2022-08-02 ENCOUNTER — Encounter: Payer: Self-pay | Admitting: Podiatry

## 2022-08-02 ENCOUNTER — Ambulatory Visit: Payer: Medicare Other | Admitting: Podiatry

## 2022-08-02 DIAGNOSIS — M79675 Pain in left toe(s): Secondary | ICD-10-CM | POA: Diagnosis not present

## 2022-08-02 DIAGNOSIS — B351 Tinea unguium: Secondary | ICD-10-CM | POA: Diagnosis not present

## 2022-08-02 DIAGNOSIS — M79674 Pain in right toe(s): Secondary | ICD-10-CM

## 2022-08-03 NOTE — Progress Notes (Signed)
  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  Allergies  Allergen Reactions   Lidocaine Anaphylaxis   Morphine Other (See Comments)    "Body shuts down"   Procaine Hcl Anaphylaxis, Rash and Other (See Comments)    "Anything with 'caine' in it "   Sulfonamide Derivatives Hives   Amlodipine Swelling   Norco [Hydrocodone-Acetaminophen] Nausea And Vomiting and Other (See Comments)    States does ok with IV form    Tramadol Nausea Only   Vytorin [Ezetimibe-Simvastatin] Other (See Comments)    Unknown reaction    Tape Itching and Other (See Comments)    Patient prefers either paper tape or Coban wrap    Review of Systems: Negative except as noted in the HPI.  Objective: No changes noted in today's physical examination.  Barbara [name] is a pleasant 81 y.o. female WD, WN in NAD. AAO x 3.  Neurovascular Examination: CFT immediate b/l LE. Palpable DP/PT pulses b/l LE. Digital hair sparse b/l. Skin temperature gradient WNL b/l. No pain with calf compression b/l. Trace edema bilateral ankles.. No cyanosis or clubbing noted b/l LE.  Protective sensation intact 5/5 intact bilaterally with 10g monofilament b/l.  Dermatological:  Pedal skin warm and supple b/l.  No open wounds b/l. No interdigital macerations. Toenails 1-5 b/l elongated, thickened, discolored with subungual debris. +Tenderness with dorsal palpation of nailplates. No hyperkeratotic nor porokeratotic lesions noted b/l.  Musculoskeletal:  Muscle strength 5/5 to all lower extremity muscle groups bilaterally. No pain, crepitus or joint limitation noted with ROM bilateral LE. No gross bony deformities bilaterally. Utilizes cane for ambulation  assistance.  Assessment/Plan: 1. Pain due to onychomycosis of toenails of both feet     No orders of the defined types were placed in this encounter.   -Consent given for treatment as described below: -Examined patient. -Patient to continue soft, supportive shoe gear daily. -Toenails 1-5 b/l were debrided in length and girth with sterile nail nippers and dremel without iatrogenic bleeding.  -Patient/POA to call should there be question/concern in the interim.   Return in about 3 months (around 11/01/2022).  Marzetta Board, DPM

## 2022-08-04 ENCOUNTER — Inpatient Hospital Stay (HOSPITAL_BASED_OUTPATIENT_CLINIC_OR_DEPARTMENT_OTHER)
Admission: EM | Admit: 2022-08-04 | Discharge: 2022-08-08 | DRG: 291 | Disposition: A | Discharge status: Home Health | Payer: Medicare Other | Attending: Internal Medicine | Admitting: Internal Medicine

## 2022-08-04 ENCOUNTER — Emergency Department (HOSPITAL_BASED_OUTPATIENT_CLINIC_OR_DEPARTMENT_OTHER): Payer: Medicare Other | Admitting: Radiology

## 2022-08-04 ENCOUNTER — Other Ambulatory Visit: Payer: Self-pay

## 2022-08-04 ENCOUNTER — Encounter (HOSPITAL_BASED_OUTPATIENT_CLINIC_OR_DEPARTMENT_OTHER): Payer: Self-pay

## 2022-08-04 ENCOUNTER — Encounter (HOSPITAL_COMMUNITY): Payer: Self-pay

## 2022-08-04 DIAGNOSIS — Z885 Allergy status to narcotic agent status: Secondary | ICD-10-CM | POA: Diagnosis not present

## 2022-08-04 DIAGNOSIS — Z79899 Other long term (current) drug therapy: Secondary | ICD-10-CM | POA: Diagnosis not present

## 2022-08-04 DIAGNOSIS — I252 Old myocardial infarction: Secondary | ICD-10-CM | POA: Diagnosis not present

## 2022-08-04 DIAGNOSIS — Z950 Presence of cardiac pacemaker: Secondary | ICD-10-CM

## 2022-08-04 DIAGNOSIS — Z7951 Long term (current) use of inhaled steroids: Secondary | ICD-10-CM | POA: Diagnosis not present

## 2022-08-04 DIAGNOSIS — Z90710 Acquired absence of both cervix and uterus: Secondary | ICD-10-CM

## 2022-08-04 DIAGNOSIS — I130 Hypertensive heart and chronic kidney disease with heart failure and stage 1 through stage 4 chronic kidney disease, or unspecified chronic kidney disease: Principal | ICD-10-CM | POA: Diagnosis present

## 2022-08-04 DIAGNOSIS — E785 Hyperlipidemia, unspecified: Secondary | ICD-10-CM | POA: Diagnosis present

## 2022-08-04 DIAGNOSIS — I509 Heart failure, unspecified: Secondary | ICD-10-CM

## 2022-08-04 DIAGNOSIS — Z888 Allergy status to other drugs, medicaments and biological substances status: Secondary | ICD-10-CM

## 2022-08-04 DIAGNOSIS — N1832 Chronic kidney disease, stage 3b: Secondary | ICD-10-CM | POA: Diagnosis not present

## 2022-08-04 DIAGNOSIS — Z91048 Other nonmedicinal substance allergy status: Secondary | ICD-10-CM

## 2022-08-04 DIAGNOSIS — G4733 Obstructive sleep apnea (adult) (pediatric): Secondary | ICD-10-CM | POA: Diagnosis present

## 2022-08-04 DIAGNOSIS — Z7982 Long term (current) use of aspirin: Secondary | ICD-10-CM

## 2022-08-04 DIAGNOSIS — J209 Acute bronchitis, unspecified: Secondary | ICD-10-CM | POA: Diagnosis not present

## 2022-08-04 DIAGNOSIS — Z960 Presence of urogenital implants: Secondary | ICD-10-CM | POA: Diagnosis present

## 2022-08-04 DIAGNOSIS — K219 Gastro-esophageal reflux disease without esophagitis: Secondary | ICD-10-CM | POA: Diagnosis present

## 2022-08-04 DIAGNOSIS — I480 Paroxysmal atrial fibrillation: Secondary | ICD-10-CM | POA: Diagnosis present

## 2022-08-04 DIAGNOSIS — J9601 Acute respiratory failure with hypoxia: Secondary | ICD-10-CM | POA: Diagnosis present

## 2022-08-04 DIAGNOSIS — Z87891 Personal history of nicotine dependence: Secondary | ICD-10-CM

## 2022-08-04 DIAGNOSIS — N183 Chronic kidney disease, stage 3 unspecified: Secondary | ICD-10-CM | POA: Diagnosis present

## 2022-08-04 DIAGNOSIS — I2510 Atherosclerotic heart disease of native coronary artery without angina pectoris: Secondary | ICD-10-CM | POA: Diagnosis present

## 2022-08-04 DIAGNOSIS — Z1152 Encounter for screening for COVID-19: Secondary | ICD-10-CM

## 2022-08-04 DIAGNOSIS — N179 Acute kidney failure, unspecified: Secondary | ICD-10-CM | POA: Diagnosis not present

## 2022-08-04 DIAGNOSIS — E876 Hypokalemia: Secondary | ICD-10-CM | POA: Diagnosis not present

## 2022-08-04 DIAGNOSIS — I110 Hypertensive heart disease with heart failure: Secondary | ICD-10-CM | POA: Diagnosis not present

## 2022-08-04 DIAGNOSIS — I2489 Other forms of acute ischemic heart disease: Secondary | ICD-10-CM | POA: Diagnosis present

## 2022-08-04 DIAGNOSIS — Z7901 Long term (current) use of anticoagulants: Secondary | ICD-10-CM

## 2022-08-04 DIAGNOSIS — Z955 Presence of coronary angioplasty implant and graft: Secondary | ICD-10-CM

## 2022-08-04 DIAGNOSIS — Z8249 Family history of ischemic heart disease and other diseases of the circulatory system: Secondary | ICD-10-CM | POA: Diagnosis not present

## 2022-08-04 DIAGNOSIS — Z96611 Presence of right artificial shoulder joint: Secondary | ICD-10-CM | POA: Diagnosis present

## 2022-08-04 DIAGNOSIS — J40 Bronchitis, not specified as acute or chronic: Secondary | ICD-10-CM

## 2022-08-04 DIAGNOSIS — I495 Sick sinus syndrome: Secondary | ICD-10-CM | POA: Diagnosis not present

## 2022-08-04 DIAGNOSIS — Z882 Allergy status to sulfonamides status: Secondary | ICD-10-CM | POA: Diagnosis not present

## 2022-08-04 DIAGNOSIS — R059 Cough, unspecified: Secondary | ICD-10-CM | POA: Diagnosis not present

## 2022-08-04 DIAGNOSIS — I4891 Unspecified atrial fibrillation: Secondary | ICD-10-CM | POA: Diagnosis present

## 2022-08-04 DIAGNOSIS — I5033 Acute on chronic diastolic (congestive) heart failure: Secondary | ICD-10-CM | POA: Diagnosis not present

## 2022-08-04 HISTORY — DX: Presence of cardiac pacemaker: Z95.0

## 2022-08-04 LAB — BASIC METABOLIC PANEL
Anion gap: 11 (ref 5–15)
BUN: 19 mg/dL (ref 8–23)
CO2: 24 mmol/L (ref 22–32)
Calcium: 10 mg/dL (ref 8.9–10.3)
Chloride: 105 mmol/L (ref 98–111)
Creatinine, Ser: 1.51 mg/dL — ABNORMAL HIGH (ref 0.44–1.00)
GFR, Estimated: 34 mL/min — ABNORMAL LOW (ref >60)
Glucose, Bld: 126 mg/dL — ABNORMAL HIGH (ref 70–99)
Potassium: 3.6 mmol/L (ref 3.5–5.1)
Sodium: 140 mmol/L (ref 135–145)

## 2022-08-04 LAB — TROPONIN I (HIGH SENSITIVITY)
Troponin I (High Sensitivity): 56 ng/L — ABNORMAL HIGH (ref <18)
Troponin I (High Sensitivity): 59 ng/L — ABNORMAL HIGH (ref <18)

## 2022-08-04 LAB — CBC
HCT: 32.5 % — ABNORMAL LOW (ref 36.0–46.0)
Hemoglobin: 10.3 g/dL — ABNORMAL LOW (ref 12.0–15.0)
MCH: 27.4 pg (ref 26.0–34.0)
MCHC: 31.7 g/dL (ref 30.0–36.0)
MCV: 86.4 fL (ref 80.0–100.0)
Platelets: 160 10*3/uL (ref 150–400)
RBC: 3.76 MIL/uL — ABNORMAL LOW (ref 3.87–5.11)
RDW: 14 % (ref 11.5–15.5)
WBC: 9.6 10*3/uL (ref 4.0–10.5)
nRBC: 0 % (ref 0.0–0.2)

## 2022-08-04 LAB — HEPATIC FUNCTION PANEL
ALT: 23 U/L (ref 0–44)
AST: 31 U/L (ref 15–41)
Albumin: 4 g/dL (ref 3.5–5.0)
Alkaline Phosphatase: 74 U/L (ref 38–126)
Bilirubin, Direct: 0.2 mg/dL (ref 0.0–0.2)
Indirect Bilirubin: 1 mg/dL — ABNORMAL HIGH (ref 0.3–0.9)
Total Bilirubin: 1.2 mg/dL (ref 0.3–1.2)
Total Protein: 7.3 g/dL (ref 6.5–8.1)

## 2022-08-04 LAB — RESP PANEL BY RT-PCR (FLU A&B, COVID) ARPGX2
Influenza A by PCR: NEGATIVE
Influenza B by PCR: NEGATIVE
SARS Coronavirus 2 by RT PCR: NEGATIVE

## 2022-08-04 LAB — BRAIN NATRIURETIC PEPTIDE: B Natriuretic Peptide: 1908.9 pg/mL — ABNORMAL HIGH (ref 0.0–100.0)

## 2022-08-04 MED ORDER — HYDRALAZINE HCL 20 MG/ML IJ SOLN: 5.0000 mg | INTRAMUSCULAR | Status: DC | PRN

## 2022-08-04 MED ORDER — POLYETHYLENE GLYCOL 3350 17 GM/SCOOP PO POWD
17.0000 g | Freq: Every day | ORAL | Status: DC
  Filled 2022-08-04: qty 255

## 2022-08-04 MED ORDER — AMIODARONE HCL 200 MG PO TABS
200.0000 mg | ORAL_TABLET | Freq: Every day | ORAL | Status: DC
Start: 2022-08-05 — End: 2022-08-04

## 2022-08-04 MED ORDER — HYDRALAZINE HCL 25 MG PO TABS
50.0000 mg | ORAL_TABLET | Freq: Three times a day (TID) | ORAL | Status: DC
  Administered 2022-08-04: 50 mg via ORAL
  Filled 2022-08-04: qty 2

## 2022-08-04 MED ORDER — FUROSEMIDE 10 MG/ML IJ SOLN
20.0000 mg | Freq: Once | INTRAMUSCULAR | Status: AC
  Administered 2022-08-04: 20 mg via INTRAVENOUS
  Filled 2022-08-04: qty 2

## 2022-08-04 MED ORDER — PANTOPRAZOLE SODIUM 40 MG PO TBEC: 40.0000 mg | DELAYED_RELEASE_TABLET | Freq: Every day | ORAL | Status: DC

## 2022-08-04 MED ORDER — DOCUSATE SODIUM 100 MG PO CAPS: 100.0000 mg | ORAL_CAPSULE | Freq: Two times a day (BID) | ORAL | Status: DC

## 2022-08-04 MED ORDER — ROSUVASTATIN CALCIUM 20 MG PO TABS
40.0000 mg | ORAL_TABLET | Freq: Every day | ORAL | Status: DC
  Filled 2022-08-04: qty 1

## 2022-08-04 MED ORDER — ALBUTEROL SULFATE (2.5 MG/3ML) 0.083% IN NEBU: 2.5000 mg | INHALATION_SOLUTION | Freq: Four times a day (QID) | RESPIRATORY_TRACT | Status: DC | PRN

## 2022-08-04 MED ORDER — METOPROLOL SUCCINATE ER 50 MG PO TB24
50.0000 mg | ORAL_TABLET | Freq: Every day | ORAL | Status: DC
Start: 2022-08-05 — End: 2022-08-04

## 2022-08-04 MED ORDER — HYDRALAZINE HCL 50 MG PO TABS: 50.0000 mg | ORAL_TABLET | Freq: Three times a day (TID) | ORAL | Status: DC

## 2022-08-04 MED ORDER — ALUM & MAG HYDROXIDE-SIMETH 200-200-20 MG/5ML PO SUSP: 30.0000 mL | Freq: Once | ORAL | Status: DC

## 2022-08-04 MED ORDER — DEXTROMETHORPHAN POLISTIREX ER 30 MG/5ML PO SUER: 15.0000 mg | Freq: Every day | ORAL | Status: DC | PRN

## 2022-08-04 MED ORDER — APIXABAN 2.5 MG PO TABS: 2.5000 mg | ORAL_TABLET | Freq: Two times a day (BID) | ORAL | Status: DC

## 2022-08-04 MED ORDER — IPRATROPIUM BROMIDE 0.06 % NA SOLN
2.0000 | Freq: Two times a day (BID) | NASAL | Status: DC | PRN
Start: 2022-08-04 — End: 2022-08-04

## 2022-08-04 MED ORDER — ALBUTEROL SULFATE (2.5 MG/3ML) 0.083% IN NEBU
2.5000 mg | INHALATION_SOLUTION | Freq: Once | RESPIRATORY_TRACT | Status: AC
  Administered 2022-08-04: 2.5 mg via RESPIRATORY_TRACT
  Filled 2022-08-04: qty 3

## 2022-08-04 MED ORDER — FLUTICASONE PROPIONATE 50 MCG/ACT NA SUSP: 2.0000 | NASAL | Status: DC | PRN

## 2022-08-04 MED ORDER — ASPIRIN 81 MG PO CHEW
81.0000 mg | CHEWABLE_TABLET | Freq: Every day | ORAL | Status: DC
Start: 2022-08-05 — End: 2022-08-04

## 2022-08-04 MED ORDER — ISOSORBIDE MONONITRATE ER 30 MG PO TB24: 30.0000 mg | ORAL_TABLET | Freq: Every day | ORAL | Status: DC

## 2022-08-04 MED ORDER — ACETAMINOPHEN 500 MG PO TABS: 500.0000 mg | ORAL_TABLET | Freq: Every day | ORAL | Status: DC | PRN

## 2022-08-04 MED ORDER — LORAZEPAM 0.5 MG PO TABS: 0.5000 mg | ORAL_TABLET | Freq: Four times a day (QID) | ORAL | Status: DC | PRN

## 2022-08-04 MED ORDER — FUROSEMIDE 10 MG/ML IJ SOLN
20.0000 mg | Freq: Two times a day (BID) | INTRAMUSCULAR | Status: DC
  Administered 2022-08-05: 20 mg via INTRAVENOUS
  Filled 2022-08-04: qty 2

## 2022-08-04 MED ORDER — LORATADINE 10 MG PO TABS: 10.0000 mg | ORAL_TABLET | Freq: Every day | ORAL | Status: DC | PRN

## 2022-08-04 NOTE — ED Notes (Signed)
Report given to [facility] 

## 2022-08-04 NOTE — ED Provider Notes (Signed)
[facility] EMERGENCY DEPT Provider Note   CSN: [id number] Arrival date & time: 08/04/22  1332     History  Chief Complaint  Patient presents with   Shortness of Breath    Barbara [name] is a 81 y.o. female.  HPI 81 year old female presents with shortness of breath.  Shortness of breath started yesterday.  No significant cough.  She feels a little bit of discomfort in her upper abdomen which typically happens whenever the shortness of breath happens.  She denies any chest pain but has significant exertional dyspnea to the point that she can barely walk.  Her legs are swollen this morning but she states that is not particularly worse than normal but she also did not take her Lasix this morning because she was coming to the ER.  She denies fevers. Has heard some wheezing and is worried about bronchitis.  Home Medications Prior to Admission medications   Medication Sig Start Date End Date Taking? Authorizing Provider  acetaminophen (TYLENOL) 500 MG tablet Take 500 mg by mouth daily as needed for headache.   Yes [provider]  amiodarone (PACERONE) 200 MG tablet Take 1 tablet (200 mg total) by mouth daily. 06/10/22  Yes Almyra Deforest, PA  apixaban (ELIQUIS) 2.5 MG TABS tablet Take 1 tablet (2.5 mg total) by mouth 2 (two) times daily. 02/11/22  Yes Minus Breeding, MD  Ascorbic Acid (VITAMIN C) 1000 MG tablet Take 1,000 mg by mouth daily.   Yes [provider]  aspirin 81 MG chewable tablet Chew 1 tablet (81 mg total) by mouth daily. 05/11/22  Yes Thurnell Lose, MD  Cholecalciferol (VITAMIN D3) 2000 units TABS Take 2,000 Units by mouth daily.   Yes [provider]  dextromethorphan (DELSYM) 30 MG/5ML liquid Take 15 mg by mouth daily as needed for cough.   Yes [provider]  docusate sodium (COLACE) 100 MG capsule Take 1 capsule (100 mg total) by mouth 2 (two) times daily. Patient taking differently: Take 100 mg by mouth daily. 08/01/21   Yes Laurey Morale, MD  furosemide (LASIX) 20 MG tablet Take 1 tablet by mouth once daily 07/29/22  Yes Laurey Morale, MD  hydrALAZINE (APRESOLINE) 50 MG tablet Take 1 tablet (50 mg total) by mouth every 8 (eight) hours. Patient taking differently: Take 50 mg by mouth 2 (two) times daily. 06/27/22  Yes Laurey Morale, MD  ipratropium (ATROVENT) 0.06 % nasal spray Place 2 sprays into both nostrils 2 (two) times daily as needed (allergies). 04/28/18  Yes [provider]  isosorbide mononitrate (IMDUR) 30 MG 24 hr tablet Take 1 tablet (30 mg total) by mouth daily. 06/20/22  Yes Laurey Morale, MD  ketoconazole (NIZORAL) 2 % cream Apply 1 Application topically daily as needed for irritation (for rash under breast).   Yes [provider]  loratadine (CLARITIN) 10 MG tablet Take 10 mg by mouth daily as needed for allergies.   Yes [provider]  LORazepam (ATIVAN) 0.5 MG tablet TAKE 1 TABLET BY MOUTH EVERY 6 HOURS AS NEEDED FOR ANXIETY 04/23/22  Yes Laurey Morale, MD  metoprolol succinate (TOPROL-XL) 50 MG 24 hr tablet Take 1 tablet (50 mg total) by mouth daily. 06/24/22  Yes Baldwin Jamaica, PA-C  Multiple Vitamin (MULTIVITAMIN WITH MINERALS) TABS tablet Take 1 tablet by mouth daily.   Yes [provider]  nitroGLYCERIN (NITROSTAT) 0.4 MG SL tablet DISSOLVE ONE TABLET UNDER THE TONGUE EVERY 5 MINUTES AS NEEDED FOR  CHEST PAIN.  DO NOT EXCEED A TOTAL OF 3 DOSES IN 15 MINUTES 06/21/21  Yes Troy Sine, MD  pantoprazole (PROTONIX) 40 MG tablet Take 1 tablet (40 mg total) by mouth daily. 01/03/22  Yes Troy Sine, MD  Probiotic Product (PROBIOTIC GUMMIES PO) Take 2 tablets by mouth every morning.   Yes [provider]  rosuvastatin (CRESTOR) 40 MG tablet Take 1 tablet (40 mg total) by mouth daily. 01/03/22  Yes Troy Sine, MD  triamcinolone (NASACORT) 55 MCG/ACT nasal inhaler Place 1 spray into both nostrils daily as needed (for allergies).  10/17/12  Yes  Laurey Morale, MD  polyethylene glycol powder (GLYCOLAX/MIRALAX) 17 GM/SCOOP powder Take 17 g by mouth daily. Patient not taking: Reported on 08/04/2022 05/10/22   Thurnell Lose, MD  PRESCRIPTION MEDICATION See admin instructions. CPAP- At bedtime    [provider]      Allergies    Lidocaine, Morphine, Procaine hcl, Sulfonamide derivatives, Amlodipine, Norco [hydrocodone-acetaminophen], Tramadol, Vytorin [ezetimibe-simvastatin], and Tape    Review of Systems   Review of Systems  Constitutional:  Negative for fever.  Respiratory:  Positive for cough, shortness of breath and wheezing.   Cardiovascular:  Positive for leg swelling. Negative for chest pain.    Physical Exam Updated Vital Signs BP (!) 172/58 (BP Location: Left Arm)   Pulse 60   Temp 99.5 F (37.5 C) (Oral)   Resp 20   Ht '5\' 7"'$  (1.702 m)   Wt 79.9 kg   SpO2 94%   BMI 27.59 kg/m  Physical Exam Vitals and nursing note reviewed.  Constitutional:      General: She is not in acute distress.    Appearance: She is well-developed. She is not ill-appearing or diaphoretic.  HENT:     Head: Normocephalic and atraumatic.  Cardiovascular:     Rate and Rhythm: Normal rate and regular rhythm.     Heart sounds: Normal heart sounds.  Pulmonary:     Effort: Pulmonary effort is normal.     Breath sounds: Examination of the right-lower field reveals rales. Examination of the left-lower field reveals rales. Rales present.  Abdominal:     Palpations: Abdomen is soft.     Tenderness: There is no abdominal tenderness.  Musculoskeletal:     Right lower leg: Edema present.     Left lower leg: Edema present.     Comments: Bilateral ankle/foot edema  Skin:    General: Skin is warm and dry.  Neurological:     Mental Status: She is alert.     ED Results / Procedures / Treatments   Labs (all labs ordered are listed, but only abnormal results are displayed) Labs Reviewed  BASIC METABOLIC PANEL - Abnormal; Notable for  the following components:      Result Value   Glucose, Bld 126 (*)    Creatinine, Ser 1.51 (*)    GFR, Estimated 34 (*)    All other components within normal limits  CBC - Abnormal; Notable for the following components:   RBC 3.76 (*)    Hemoglobin 10.3 (*)    HCT 32.5 (*)    All other components within normal limits  BRAIN NATRIURETIC PEPTIDE - Abnormal; Notable for the following components:   B Natriuretic Peptide 1,908.9 (*)    All other components within normal limits  HEPATIC FUNCTION PANEL - Abnormal; Notable for the following components:   Indirect Bilirubin 1.0 (*)    All other components within normal  limits  TROPONIN I (HIGH SENSITIVITY) - Abnormal; Notable for the following components:   Troponin I (High Sensitivity) 56 (*)    All other components within normal limits  TROPONIN I (HIGH SENSITIVITY) - Abnormal; Notable for the following components:   Troponin I (High Sensitivity) 59 (*)    All other components within normal limits  RESP PANEL BY RT-PCR (FLU A&B, COVID) [id number]  BASIC METABOLIC PANEL    EKG EKG Interpretation  Date/Time:  Sunday August 04 2022 13:48:46 EDT Ventricular Rate:  60 PR Interval:  254 QRS Duration: 90 QT Interval:  432 QTC Calculation: 432 R Axis:   72 Text Interpretation: Atrial-paced rhythm with prolonged AV conduction Left ventricular hypertrophy with repolarization abnormality ( Sokolow-Lyon ) Abnormal ECG When compared with ECG of 09-Jun-2022 10:04, PREVIOUS ECG IS PRESENT Confirmed by Regan Lemming ([id number]) on 08/04/2022 1:51:59 PM  Radiology DG Chest 2 View  Result Date: 08/04/2022 CLINICAL DATA:  Cough and sinus congestion EXAM: CHEST - 2 VIEW COMPARISON:  06/09/2022 FINDINGS: LEFT-sided pacemaker overlies normal cardiac silhouette. There is central venous congestion peribronchial thickening. Lungs are hyperinflated. No pleural fluid. LEFT shoulder prosthetic IMPRESSION: 1. Central venous congestion. 2. Peribronchial thickening  without focal consolidation. Potential bronchitis. 3. Hyperinflated lungs. Electronically Signed   By: Suzy Bouchard M.D.   On: 08/04/2022 14:31    Procedures Procedures    Medications Ordered in ED Medications  furosemide (LASIX) injection 20 mg (has no administration in time range)  albuterol (PROVENTIL) (2.5 MG/3ML) 0.083% nebulizer solution 2.5 mg (has no administration in time range)  alum & mag hydroxide-simeth (MAALOX/MYLANTA) 200-200-20 MG/5ML suspension 30 mL (has no administration in time range)  hydrALAZINE (APRESOLINE) injection 5 mg (has no administration in time range)  furosemide (LASIX) injection 20 mg (20 mg Intravenous Given 08/04/22 1638)  albuterol (PROVENTIL) (2.5 MG/3ML) 0.083% nebulizer solution 2.5 mg (2.5 mg Nebulization Given 08/04/22 1645)    ED Course/ Medical Decision Making/ A&P                           Medical Decision Making Amount and/or Complexity of Data Reviewed Labs: ordered.    Details: Troponin elevation is similar to priors and flat here, consistent with CHF.  BNP of 1900 is significantly higher than baseline.  COVID/flu negative Radiology: ordered and independent interpretation performed.    Details: No significant CHF seen ECG/medicine tests: independent interpretation performed.    Details: Paced rhythm  Risk Prescription drug management. Decision regarding hospitalization.   Patient's chest x-ray is overall unremarkable but with some rales on exam and mild hypoxia down to about 85% along with a significantly elevated BNP makes me think this is all CHF.  She has been hearing some wheezing so she was given albuterol but I think this is more of a fluid overload situation.  She was given 20 mg IV Lasix and she is only on 20 mg at home and fairly quickly had about 400 cc of urine out.  She will continue to need diuresis as she still mildly hypoxic.  However she is not in respiratory distress.  I doubt this is from ACS.  Discussed with Dr.  Posey Pronto, who will admit.        Final Clinical Impression(s) / ED Diagnoses Final diagnoses:  Acute respiratory failure with hypoxia ([place])  Acute congestive heart failure, unspecified heart failure type ([place])    Rx / DC Orders ED Discharge Orders     None  Sherwood Gambler, MD 08/04/22 2329

## 2022-08-04 NOTE — H&P (Signed)
History and Physical    Barbara [name] UYQ:[id number] DOB: 07-27-1941 DOA: 08/04/2022  PCP: Barbara Morale, MD  Patient coming from: Home  Chief Complaint: Shortness of breath  HPI: Barbara [name] is a 81 y.o. 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  Hypertensive on arrival with blood pressure 177/59.  Oxygen saturation 85-89% on room air, improved with 2 L supplemental oxygen.  Patient afebrile and not tachycardic.  Labs showing no leukocytosis, hemoglobin 10.3 (no significant change from baseline), creatinine 1.5 (stable), troponin 56> 59, COVID and influenza PCR negative, BNP 1908.  Chest x-ray showing central venous congestion and peribronchial thickening without focal consolidation. Patient was given IV Lasix 20 mg, p.o. hydralazine 50 mg, and albuterol neb treatment.  Patient reports 2-day history of shortness of breath and mild cough.  Also endorsing bilateral lower extremity edema.  She is watching her dietary fluid and sodium intake.  She is taking Lasix 20 mg daily.  She checked her temperature last night and it was 100 F.  Denies chest pain.  She is endorsing mild epigastric discomfort and nausea but has not vomited.  She is feeling hungry and is requesting food.  No other complaints.  Review of Systems:  Review of Systems  All other systems reviewed and are negative.   Past Medical History:  Diagnosis Date   A-fib [facility])    Allergy    CAD (coronary artery disease)    sees Dr. Shelva Majestic  cardiac stents - 2000   CHF (congestive heart failure) ([place])    Colon polyps    Complication of anesthesia    rash/hives with "caines"   Dyspnea    02/12/18 " when my heart gets out of rhythm, it has not been out of rhythym- since I have been on  Tikosyn (11/2017)   Dysrhythmia    afib fib   GERD (gastroesophageal reflux disease)    takes OTC- Omeprazole- prn   Heart murmur    History of kidney stones    History of stress test    show normal perfusion without scar or ischemia, post EF 68%   Hx of echocardiogram    show an EF 55%-60% range with grade 1 diastolic dysfunction, she had mitral anular calcification with mild MR, moderate LA dilation and mild pulmonary hypertension with a PA estimated pressure of 66m   Hyperlipidemia    Hypertension    [place]STEMI (non-ST elevated [place] infarction) [place]    Osteoarthritis    Pacemaker    Pneumonia    hx of 2015    PONV (postoperative nausea and vomiting)    Sleep apnea     Past Surgical History:  Procedure Laterality Date   CARDIAC CATHETERIZATION     11/2017   cardiac stents  2000   COLONOSCOPY  01-05-14   per Dr. JTeena Irani clear, no repeats needed    COLONOSCOPY     CORONARY STENT PLACEMENT  2000   [place] URETEROSCOPY AND STENT PLACEMENT Left 06/11/2022   Procedure: CYSTOSCOPY WITH RETROGRADE PYELOGRAM, LEFT STENT PLACEMENT;  Surgeon: BLucas Mallow MD;  Location: [facility];  Service: Urology;  Laterality: Left;   CYSTOSCOPY/URETEROSCOPY/HOLMIUM LASER/STENT PLACEMENT Left 06/25/2022   Procedure: CYSTOSCOPY LEFT URETEROSCOPY/HOLMIUM LASER/STENT PLACEMENT;  Surgeon: WIrine Seal MD;  Location: [facility];  Service: Urology;  Laterality: Left;  1 HR FOR THIS CASE   DIRECT LARYNGOSCOPY WITH RADIAESSE INJECTION N/A 02/13/2018   Procedure: DIRECT LARYNGOSCOPY WITH RADIAESSE INJECTION;  Surgeon: Melida Quitter, MD;  Location: [place];  Service: ENT;  Laterality: N/A;   EYE SURGERY Left    CATARACT REMOVAL   KNEE ARTHROSCOPY Left 01/06/2015   Procedure: LEFT KNEE ARTHROSCOPY, abrasion chondroplasty of the medial femerol condryl,medial and lateral menisectomy, microfracture , synovectomy of the suprpatellar pouch;  Surgeon: Latanya Maudlin, MD;  Location:  [facility];  Service: Orthopedics;  Laterality: Left;   LEFT HEART CATH AND CORONARY ANGIOGRAPHY N/A 02/26/2019   Procedure: LEFT HEART CATH AND CORONARY ANGIOGRAPHY;  Surgeon: Troy Sine, MD;  Location: [place] [facility];  Service: Cardiovascular;  Laterality: N/A;   LEFT HEART CATH AND CORONARY ANGIOGRAPHY N/A 05/06/2022   Procedure: LEFT HEART CATH AND CORONARY ANGIOGRAPHY;  Surgeon: Nelva Bush, MD;  Location: [place] [facility];  Service: Cardiovascular;  Laterality: N/A;   MICROLARYNGOSCOPY W/VOCAL CORD INJECTION N/A 08/07/2018   Procedure: MICROLARYNGOSCOPY WITH VOCAL CORD INJECTION OF PROLARYN;  Surgeon: Melida Quitter, MD;  Location: [place];  Service: ENT;  Laterality: N/A;  JET VENTILATION   PACEMAKER IMPLANT N/A 05/08/2022   Procedure: PACEMAKER IMPLANT;  Surgeon: Constance Haw, MD;  Location: [place] [facility];  Service: Cardiovascular;  Laterality: N/A;   REVERSE SHOULDER ARTHROPLASTY Right 03/23/2020   Procedure: REVERSE SHOULDER ARTHROPLASTY;  Surgeon: Nicholes Stairs, MD;  Location: [place];  Service: Orthopedics;  Laterality: Right;   VAGINAL HYSTERECTOMY  1971     reports that she quit smoking about 25 years ago. Her smoking use included cigarettes. She has never used smokeless tobacco. She reports that she does not drink alcohol and does not use drugs.  Allergies  Allergen Reactions   Lidocaine Anaphylaxis   Morphine Other (See Comments)    "Body shuts down"   Procaine Hcl Anaphylaxis, Rash and Other (See Comments)    "Anything with 'caine' in it "   Sulfonamide Derivatives Hives   Amlodipine Swelling   Norco [Hydrocodone-Acetaminophen] Nausea And Vomiting and Other (See Comments)    States does ok with IV form    Tramadol Nausea Only   Vytorin [Ezetimibe-Simvastatin] Other (See Comments)    Unknown reaction    Tape Itching and Other (See Comments)    Patient prefers either paper tape or Coban wrap    Family History  Problem Relation Age of Onset    Cancer Maternal Grandmother    Heart disease Maternal Grandfather     Prior to Admission medications   Medication Sig Start Date End Date Taking? Authorizing Provider  amiodarone (PACERONE) 200 MG tablet Take 1 tablet (200 mg total) by mouth daily. 06/10/22  Yes Almyra Deforest, PA  apixaban (ELIQUIS) 2.5 MG TABS tablet Take 1 tablet (2.5 mg total) by mouth 2 (two) times daily. 02/11/22  Yes Minus Breeding, MD  aspirin 81 MG chewable tablet Chew 1 tablet (81 mg total) by mouth daily. 05/11/22  Yes Thurnell Lose, MD  furosemide (LASIX) 20 MG tablet Take 1 tablet by mouth once daily 07/29/22  Yes Barbara Morale, MD  hydrALAZINE (APRESOLINE) 50 MG tablet Take 1 tablet (50 mg total) by mouth every 8 (eight) hours. 06/27/22  Yes Barbara Morale, MD  isosorbide mononitrate (IMDUR) 30 MG 24 hr tablet Take 1 tablet (30 mg total) by mouth daily. 06/20/22  Yes Barbara Morale, MD  metoprolol succinate (TOPROL-XL) 50 MG 24 hr tablet Take 1 tablet (50 mg total) by mouth daily. 06/24/22  Yes Baldwin Jamaica, PA-C  Multiple Vitamin (MULTIVITAMIN WITH MINERALS) TABS tablet Take 1 tablet by mouth daily.   Yes [provider]  pantoprazole (PROTONIX) 40 MG tablet Take 1 tablet (40 mg total) by mouth daily. 01/03/22  Yes Troy Sine, MD  rosuvastatin (CRESTOR) 40 MG tablet Take 1 tablet (40 mg total) by mouth daily. 01/03/22  Yes Troy Sine, MD  acetaminophen (TYLENOL) 500 MG tablet Take 500 mg by mouth daily as needed for headache.    [provider]  Cholecalciferol (VITAMIN D3) 2000 units TABS Take 2,000 Units by mouth daily.    [provider]  dextromethorphan (DELSYM) 30 MG/5ML liquid Take 15 mg by mouth daily as needed for cough.    [provider]  docusate sodium (COLACE) 100 MG capsule Take 1 capsule (100 mg total) by mouth 2 (two) times daily. 08/01/21   Barbara Morale, MD  ipratropium (ATROVENT) 0.06 % nasal spray Place 2 sprays into both nostrils 2 (two) times daily as  needed (allergies). 04/28/18   [provider]  loratadine (CLARITIN) 10 MG tablet Take 10 mg by mouth daily as needed for allergies.    [provider]  LORazepam (ATIVAN) 0.5 MG tablet TAKE 1 TABLET BY MOUTH EVERY 6 HOURS AS NEEDED FOR ANXIETY 04/23/22   Barbara Morale, MD  nitroGLYCERIN (NITROSTAT) 0.4 MG SL tablet DISSOLVE ONE TABLET UNDER THE TONGUE EVERY 5 MINUTES AS NEEDED FOR CHEST PAIN.  DO NOT EXCEED A TOTAL OF 3 DOSES IN 15 MINUTES 06/21/21   Troy Sine, MD  polyethylene glycol powder (GLYCOLAX/MIRALAX) 17 GM/SCOOP powder Take 17 g by mouth daily. 05/10/22   Thurnell Lose, MD  PRESCRIPTION MEDICATION See admin instructions. CPAP- At bedtime    [provider]  Probiotic Product (PROBIOTIC GUMMIES PO) Take 2 tablets by mouth every morning.    [provider]  triamcinolone (NASACORT) 55 MCG/ACT nasal inhaler Place 1 spray into both nostrils daily as needed (for allergies).  10/17/12   Barbara Morale, MD    Physical Exam: Vitals:   08/04/22 1800 08/04/22 1844 08/04/22 1930 08/04/22 2000  BP: (!) 167/67  (!) 162/54 (!) 176/91  Pulse: 60  60 60  Resp: (!) 29  (!) 24 (!) 22  Temp:  99.5 F (37.5 C)    TempSrc:  Oral    SpO2: 94%  94% 96%  Weight:      Height:        Physical Exam Vitals reviewed.  Constitutional:      General: She is not in acute distress. HENT:     Head: Normocephalic and atraumatic.  Eyes:     Extraocular Movements: Extraocular movements intact.  Cardiovascular:     Rate and Rhythm: Normal rate and regular rhythm.     Pulses: Normal pulses.  Pulmonary:     Effort: Pulmonary effort is normal. No respiratory distress.     Breath sounds: Rales present. No wheezing.  Abdominal:     General: Bowel sounds are normal. There is no distension.     Palpations: Abdomen is soft.     Tenderness: There is no abdominal tenderness. There is no guarding.  Musculoskeletal:     Cervical back: Normal range of motion.     Right  lower leg: Edema present.     Left lower leg: Edema present.  Comments: +2 pedal edema bilaterally  Skin:    General: Skin is warm and dry.  Neurological:     General: No focal deficit present.     Mental Status: She is alert and oriented to person, place, and time.     Labs on Admission: I have personally reviewed following labs and imaging studies  CBC: Recent Labs  Lab 08/04/22 1348  WBC 9.6  HGB 10.3*  HCT 32.5*  MCV 86.4  PLT 253   Basic Metabolic Panel: Recent Labs  Lab 08/04/22 1348  NA 140  K 3.6  CL 105  CO2 24  GLUCOSE 126*  BUN 19  CREATININE 1.51*  CALCIUM 10.0   GFR: Estimated Creatinine Clearance: 30.8 mL/min (A) (by C-G formula based on SCr of 1.51 mg/dL (H)). Liver Function Tests: Recent Labs  Lab 08/04/22 1632  AST 31  ALT 23  ALKPHOS 74  BILITOT 1.2  PROT 7.3  ALBUMIN 4.0   No results for input(s): "LIPASE", "AMYLASE" in the last 168 hours. No results for input(s): "AMMONIA" in the last 168 hours. Coagulation Profile: No results for input(s): "INR", "PROTIME" in the last 168 hours. Cardiac Enzymes: No results for input(s): "CKTOTAL", "CKMB", "CKMBINDEX", "TROPONINI" in the last 168 hours. BNP (last 3 results) No results for input(s): "PROBNP" in the last 8760 hours. HbA1C: No results for input(s): "HGBA1C" in the last 72 hours. CBG: No results for input(s): "GLUCAP" in the last 168 hours. Lipid Profile: No results for input(s): "CHOL", "HDL", "LDLCALC", "TRIG", "CHOLHDL", "LDLDIRECT" in the last 72 hours. Thyroid Function Tests: No results for input(s): "TSH", "T4TOTAL", "FREET4", "T3FREE", "THYROIDAB" in the last 72 hours. Anemia Panel: No results for input(s): "VITAMINB12", "FOLATE", "FERRITIN", "TIBC", "IRON", "RETICCTPCT" in the last 72 hours. Urine analysis:    Component Value Date/Time   COLORURINE YELLOW 06/09/2022 1426   APPEARANCEUR CLOUDY (A) 06/09/2022 1426   LABSPEC 1.010 06/09/2022 1426   PHURINE 6.5 06/09/2022  1426   GLUCOSEU NEGATIVE 06/09/2022 1426   GLUCOSEU NEGATIVE 09/13/2021 1203   HGBUR LARGE (A) 06/09/2022 1426   HGBUR negative 10/17/2010 0859   BILIRUBINUR NEGATIVE 06/09/2022 1426   BILIRUBINUR neg 08/01/2021 1037   KETONESUR NEGATIVE 06/09/2022 1426   PROTEINUR NEGATIVE 06/09/2022 1426   UROBILINOGEN 0.2 09/13/2021 1203   NITRITE NEGATIVE 06/09/2022 1426   LEUKOCYTESUR TRACE (A) 06/09/2022 1426    Radiological Exams on Admission: DG Chest 2 View  Result Date: 08/04/2022 CLINICAL DATA:  Cough and sinus congestion EXAM: CHEST - 2 VIEW COMPARISON:  06/09/2022 FINDINGS: LEFT-sided pacemaker overlies normal cardiac silhouette. There is central venous congestion peribronchial thickening. Lungs are hyperinflated. No pleural fluid. LEFT shoulder prosthetic IMPRESSION: 1. Central venous congestion. 2. Peribronchial thickening without focal consolidation. Potential bronchitis. 3. Hyperinflated lungs. Electronically Signed   By: Suzy Bouchard M.D.   On: 08/04/2022 14:31    EKG: Independently reviewed.  Paced rhythm. Inferolateral T wave inversions are not new.  Assessment and Plan  Acute on chronic diastolic CHF Acute hypoxemic respiratory failure SPO2 85-89% on room air, currently stable on 2 L O2.  Appears volume overloaded on exam.  BNP 1908 and chest x-ray showing central venous congestion. Echo done in July 2023 showing EF >75%, moderate MR. -Cardiac monitoring -Continue diuresis with IV Lasix 20 mg twice daily -Monitor intake and output -Daily weights -Low-sodium diet with fluid restriction -Continue supplemental oxygen, wean as tolerated  Possible acute bronchitis Chest x-ray showing peribronchial thickening without focal consolidation.  No fever or leukocytosis.  COVID  and flu negative.  Received albuterol neb in the ED and currently not wheezing on exam. -Albuterol neb as needed  Mild epigastric discomfort/nausea Abdominal exam benign.  No episodes of vomiting and she is  requesting food.  Symptoms possibly due to GERD/dyspepsia. -GI cocktail -Continue home PPI after pharmacy med rec is done.  Paroxysmal A-fib Has a pacemaker. -Continue metoprolol, amiodarone, and Eliquis after pharmacy med rec is done.  Tachy-brady syndrome status post PPM 05/2022 -Continue amiodarone after pharmacy med rec is done.  CAD status post PCI Troponin elevation likely due to demand ischemia in setting of decompensated CHF.  Troponins mildly elevated and flat, not consistent with ACS.  Patient is not endorsing chest pain.  Cath done in July 2023 showing mild to moderate nonobstructive CAD and patent proximal/mid LAD stent with up to 30% in-stent restenosis. -Continue aspirin, metoprolol, Imdur, and Crestor after pharmacy med rec is done.  Hypertension Blood pressure elevated with systolic currently in the 170s. -Continue IV Lasix -IV hydralazine prn SBP >160 -Continue home meds after pharmacy med rec is done.  Hyperlipidemia -Continue Crestor after pharmacy med rec is done.  CKD stage IIIb Creatinine stable. -Continue to monitor  OSA -Continue nightly CPAP  DVT prophylaxis: Continue Eliquis after pharmacy med rec is done. Code Status: Full Code (discussed with the patient) Family Communication: No family available at this time. Level of care: Telemetry bed Admission status: It is my clinical opinion that admission to INPATIENT is reasonable and necessary because of the expectation that this patient will require hospital care that crosses at least 2 midnights to treat this condition based on the medical complexity of the problems presented.  Given the aforementioned information, the predictability of an adverse outcome is felt to be significant.   Shela Leff MD [facility]  If 7PM-7AM, please contact night-coverage [email or web address]  08/04/2022, 8:59 PM

## 2022-08-04 NOTE — ED Triage Notes (Signed)
Patient here POV from Home.  Endorses SOB since Yesterday that has since progressed today.   No New URI Symptoms. Patient has Allergies and originally attributed Symptoms to Same.   NAD Noted during Triage. A&Ox4. GCS 15. BIB Wheelchair.

## 2022-08-05 ENCOUNTER — Telehealth (HOSPITAL_COMMUNITY): Payer: Self-pay | Admitting: Pharmacy Technician

## 2022-08-05 ENCOUNTER — Encounter (HOSPITAL_COMMUNITY): Payer: Self-pay | Admitting: Internal Medicine

## 2022-08-05 ENCOUNTER — Other Ambulatory Visit (HOSPITAL_COMMUNITY): Payer: Self-pay

## 2022-08-05 DIAGNOSIS — I5033 Acute on chronic diastolic (congestive) heart failure: Secondary | ICD-10-CM | POA: Diagnosis not present

## 2022-08-05 LAB — BASIC METABOLIC PANEL
Anion gap: 13 (ref 5–15)
BUN: 16 mg/dL (ref 8–23)
CO2: 24 mmol/L (ref 22–32)
Calcium: 10 mg/dL (ref 8.9–10.3)
Chloride: 103 mmol/L (ref 98–111)
Creatinine, Ser: 1.64 mg/dL — ABNORMAL HIGH (ref 0.44–1.00)
GFR, Estimated: 31 mL/min — ABNORMAL LOW (ref >60)
Glucose, Bld: 105 mg/dL — ABNORMAL HIGH (ref 70–99)
Potassium: 3.2 mmol/L — ABNORMAL LOW (ref 3.5–5.1)
Sodium: 140 mmol/L (ref 135–145)

## 2022-08-05 MED ORDER — ACETAMINOPHEN 325 MG PO TABS
650.0000 mg | ORAL_TABLET | Freq: Four times a day (QID) | ORAL | Status: DC | PRN
  Administered 2022-08-05 (×2): 650 mg via ORAL
  Filled 2022-08-05 (×2): qty 2

## 2022-08-05 MED ORDER — GUAIFENESIN-DM 100-10 MG/5ML PO SYRP
5.0000 mL | ORAL_SOLUTION | ORAL | Status: DC | PRN
  Administered 2022-08-05 (×2): 5 mL via ORAL
  Filled 2022-08-05 (×2): qty 5

## 2022-08-05 MED ORDER — FUROSEMIDE 10 MG/ML IJ SOLN
20.0000 mg | Freq: Two times a day (BID) | INTRAMUSCULAR | Status: DC
  Administered 2022-08-05 – 2022-08-07 (×4): 20 mg via INTRAVENOUS
  Filled 2022-08-05 (×4): qty 2

## 2022-08-05 MED ORDER — APIXABAN 2.5 MG PO TABS
2.5000 mg | ORAL_TABLET | Freq: Two times a day (BID) | ORAL | Status: DC
  Administered 2022-08-05 – 2022-08-08 (×8): 2.5 mg via ORAL
  Filled 2022-08-05 (×8): qty 1

## 2022-08-05 MED ORDER — CEFDINIR 300 MG PO CAPS
300.0000 mg | ORAL_CAPSULE | Freq: Every day | ORAL | Status: DC
  Administered 2022-08-05 – 2022-08-08 (×4): 300 mg via ORAL
  Filled 2022-08-05 (×4): qty 1

## 2022-08-05 MED ORDER — POTASSIUM CHLORIDE CRYS ER 20 MEQ PO TBCR
40.0000 meq | EXTENDED_RELEASE_TABLET | Freq: Two times a day (BID) | ORAL | Status: AC
  Administered 2022-08-05 (×2): 40 meq via ORAL
  Filled 2022-08-05 (×2): qty 2

## 2022-08-05 MED ORDER — LORATADINE 10 MG PO TABS: 10.0000 mg | ORAL_TABLET | Freq: Every day | ORAL | Status: DC | PRN

## 2022-08-05 MED ORDER — AMIODARONE HCL 200 MG PO TABS
200.0000 mg | ORAL_TABLET | Freq: Every day | ORAL | Status: DC
  Administered 2022-08-05 – 2022-08-08 (×4): 200 mg via ORAL
  Filled 2022-08-05 (×4): qty 1

## 2022-08-05 MED ORDER — HYDRALAZINE HCL 25 MG PO TABS
25.0000 mg | ORAL_TABLET | Freq: Three times a day (TID) | ORAL | Status: DC
  Administered 2022-08-05 – 2022-08-07 (×6): 25 mg via ORAL
  Filled 2022-08-05 (×6): qty 1

## 2022-08-05 MED ORDER — METOPROLOL SUCCINATE ER 25 MG PO TB24
25.0000 mg | ORAL_TABLET | Freq: Every day | ORAL | Status: DC
  Administered 2022-08-05 – 2022-08-08 (×4): 25 mg via ORAL
  Filled 2022-08-05 (×4): qty 1

## 2022-08-05 MED ORDER — HYDRALAZINE HCL 50 MG PO TABS
50.0000 mg | ORAL_TABLET | Freq: Three times a day (TID) | ORAL | Status: DC
  Administered 2022-08-05 (×2): 50 mg via ORAL
  Filled 2022-08-05 (×2): qty 1

## 2022-08-05 MED ORDER — IPRATROPIUM-ALBUTEROL 0.5-2.5 (3) MG/3ML IN SOLN
3.0000 mL | Freq: Four times a day (QID) | RESPIRATORY_TRACT | Status: DC
  Administered 2022-08-05: 3 mL via RESPIRATORY_TRACT
  Filled 2022-08-05: qty 3

## 2022-08-05 MED ORDER — ROSUVASTATIN CALCIUM 20 MG PO TABS
40.0000 mg | ORAL_TABLET | Freq: Every day | ORAL | Status: DC
  Administered 2022-08-05 – 2022-08-07 (×3): 40 mg via ORAL
  Filled 2022-08-05 (×4): qty 2

## 2022-08-05 MED ORDER — FUROSEMIDE 10 MG/ML IJ SOLN: 40.0000 mg | Freq: Two times a day (BID) | INTRAMUSCULAR | Status: DC

## 2022-08-05 NOTE — Progress Notes (Signed)
Heart Failure Stewardship Pharmacist Progress Note   PCP: Laurey Morale, MD PCP-Cardiologist: Shelva Majestic, MD    HPI:  81 yo F with PMH of afib, HFpEF, GERD, CAD s/p PCI, HTN, HLD, CKD IIIb, tachy-brady syndrome s/p PPM, and OSA on CPAP.  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 ECHO 05/2022 with LVEF >75%, no regional wall motion abnormalities, elevated LA pressures, RV normal, elevated PA pressures, moderate MR, and RA pressure of 3 mmHg. LHC at that time with mild to moderate non-obstructive CAD.   Presented to the ED on 10/1 with shortness of breath and LE edema. BNP elevated 1908.9. CXR with central venous congestion and peribronchial thickening without focal consolidation. Admitted for acute on chronic diastolic CHF and being treated for bronchitis.  Current HF Medications: Diuretic: furosemide 40 mg IV BID Beta Blocker: metoprolol XL 25 mg daily Other: hydralazine 25 mg TID  Prior to admission HF Medications: Diuretic: furosemide 20 mg daily Beta blocker: metoprolol XL 50 mg daily Other: hydralazine 50 mg BID; Imdur 30 mg daily  Pertinent Lab Values: Serum creatinine 1.64, BUN 16, Potassium 3.2, Sodium 140, BNP 1908.9  Vital Signs: Weight: 176 lbs (admission weight: 176 lbs) Blood pressure: 140-180/50s  Heart rate: 60s  I/O: -654m yesterday  Medication Assistance / Insurance Benefits Check: Does the patient have prescription insurance?  Yes Type of insurance plan: UHC Medicare  Does the patient qualify for medication assistance through manufacturers or grants?   Pending Eligible grants and/or patient assistance programs: pending Medication assistance applications in progress: none  Medication assistance applications approved: none Approved medication assistance renewals will be completed by: pending  Outpatient Pharmacy:  Prior to admission [facility]? Yes Is  the patient [facility]   Pending     Assessment: 1. Acute on chronic diastolic CHF (LVEF >>86%, due to NICM. NYHA class III symptoms. - Continue furosemide 40 mg IV BID. Strict I/Os and daily weights. KCl 40 mEq x 2 ordered for replacement, keep K>4. Check magnesium with AM labs. - Agree with reducing to metoprolol XL 25 mg daily - Noted today about adding SGLT2i per TRH. With fever this afternoon, hold off on adding SLGT2i with questionable infection. Currently in the donut hole, will attempt to get patient assistance if this is added. - Continue hydralazine 25 mg TID   Plan: 1) Medication changes recommended at this time: - Check magnesium with AM labs  2) Patient assistance: - Currently in the donut hole - Borderline household income for patient assistance through the manufacturer - will attempt to get [place] as [place].  3)  Education  - Patient has been educated on current HF medications and potential additions to HF medication regimen - Patient verbalizes understanding that over the next few months, these medication doses may change and more medications may be added to optimize HF regimen - Patient has been educated on basic disease state pathophysiology and goals of therapy   MKerby Nora PharmD, BCPS Heart Failure Stewardship Pharmacist Phone [phone]

## 2022-08-05 NOTE — Plan of Care (Signed)

## 2022-08-05 NOTE — Progress Notes (Signed)
PT stated she was drinking water "and it went down the wrong way" SaO2 88% on 2 L/min [place]. She stated this happens occasionally. She denies any food or pill intake during episode.

## 2022-08-05 NOTE — Progress Notes (Signed)
Patient declined CPAP for the night  

## 2022-08-05 NOTE — Progress Notes (Signed)
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  History of cough, mild swelling, low-grade fever as well. -In the emergency room she was hypoxic 85 to 89% on room air, labs noted creatinine of 1.5, troponin 56, flu and COVID PCR were negative, BNP was 11/12/2006, chest x-ray noted central vascular congestion and peribronchial thickening   Subjective: -Feels better, breathing starting to improve, not back to baseline, feels tired  Assessment and Plan:  Acute on chronic diastolic CHF Acute hypoxemic respiratory failure -Echo done in July 2023 showing EF >75%, moderate MR. -Patient reports missing Lasix for the last few days -Continue IV Lasix today will increase dose to 40 Mg twice daily -Add SGLT2i -Monitor I's/O, daily weights   ? Acute bronchitis Chest x-ray showing peribronchial thickening without focal consolidation -Low-grade fever in the ED, COVID and flu negative.  -Add cefdinir and DuoNebs   Paroxysmal A-fib Has a pacemaker. -Continue metoprolol, amiodarone, and Eliquis -Will decrease metoprolol dose   Tachy-brady syndrome status post PPM 05/2022 -Continue amiodarone   CAD status post PCI Troponin elevation likely due to demand ischemia in setting of decompensated CHF.  Troponins mildly elevated and flat, not consistent with ACS.  Patient is not endorsing chest pain.  Cath done in July 2023 showing mild to moderate nonobstructive CAD and patent proximal/mid LAD stent with up to 30% in-stent restenosis. -Continue aspirin, metoprolol, Imdur, and Crestor    Hypertension -Stable, meds as above   Hyperlipidemia -Continue Crestor    CKD stage  IIIb Creatinine stable. -Continue to monitor   OSA -Continue nightly CPAP   DVT prophylaxis: Eliquis Code Status: Full Code (discussed with the patient) Family Communication: Discussed with patient detail, no family at bedside Disposition:, Home likely 48 hours  Consultants:    Procedures:   Antimicrobials:    Objective: Vitals:   08/04/22 2105 08/05/22 0610 08/05/22 0827 08/05/22 0841  BP:  (!) 148/56  (!) 154/50  Pulse:  60 61 62  Resp:  '20 17 17  '$ Temp:  99.3 F (37.4 C)  98.3 F (36.8 C)  TempSrc:  Oral  Oral  SpO2:  93% 95% 93%  Weight: 79.9 kg     Height: '5\' 7"'$  (1.702 m)       Intake/Output Summary (Last 24 hours) at 08/05/2022 0913 Last data filed at 08/04/2022 1810 Gross per 24 hour  Intake --  Output 600 ml  Net -600 ml   Filed Weights   08/04/22 1344 08/04/22 2105  Weight: 78.9 kg 79.9 kg    Examination:  General exam: Pleasant elderly female sitting up in bed, AAOx3, no distress HEENT: Positive JVD CVS: S1-S2, regular rhythm Lungs: Few basilar Rales and scattered rhonchi Abdomen: Soft, nontender, bowel sounds present Extremities: No edema  Skin: No rashes Psychiatry:  Mood & affect appropriate.     Data Reviewed:   CBC: Recent Labs  Lab 08/04/22 1348  WBC 9.6  HGB 10.3*  HCT 32.5*  MCV 86.4  PLT 696   Basic Metabolic Panel: Recent Labs  Lab 08/04/22 1348 08/05/22 0326  NA 140 140  K 3.6 3.2*  CL 105 103  CO2 24 24  GLUCOSE 126* 105*  BUN 19  16  CREATININE 1.51* 1.64*  CALCIUM 10.0 10.0   GFR: Estimated Creatinine Clearance: 28.3 mL/min (A) (by C-G formula based on SCr of 1.64 mg/dL (H)). Liver Function Tests: Recent Labs  Lab 08/04/22 1632  AST 31  ALT 23  ALKPHOS 74  BILITOT 1.2  PROT 7.3  ALBUMIN 4.0   No results for input(s): "LIPASE", "AMYLASE" in the last 168 hours. No results for input(s): "AMMONIA" in the last 168 hours. Coagulation Profile: No results for input(s): "INR", "PROTIME" in the last 168  hours. Cardiac Enzymes: No results for input(s): "CKTOTAL", "CKMB", "CKMBINDEX", "TROPONINI" in the last 168 hours. BNP (last 3 results) No results for input(s): "PROBNP" in the last 8760 hours. HbA1C: No results for input(s): "HGBA1C" in the last 72 hours. CBG: No results for input(s): "GLUCAP" in the last 168 hours. Lipid Profile: No results for input(s): "CHOL", "HDL", "LDLCALC", "TRIG", "CHOLHDL", "LDLDIRECT" in the last 72 hours. Thyroid Function Tests: No results for input(s): "TSH", "T4TOTAL", "FREET4", "T3FREE", "THYROIDAB" in the last 72 hours. Anemia Panel: No results for input(s): "VITAMINB12", "FOLATE", "FERRITIN", "TIBC", "IRON", "RETICCTPCT" in the last 72 hours. Urine analysis:    Component Value Date/Time   COLORURINE YELLOW 06/09/2022 1426   APPEARANCEUR CLOUDY (A) 06/09/2022 1426   LABSPEC 1.010 06/09/2022 1426   PHURINE 6.5 06/09/2022 1426   GLUCOSEU NEGATIVE 06/09/2022 1426   GLUCOSEU NEGATIVE 09/13/2021 1203   HGBUR LARGE (A) 06/09/2022 1426   HGBUR negative 10/17/2010 0859   BILIRUBINUR NEGATIVE 06/09/2022 1426   BILIRUBINUR neg 08/01/2021 1037   KETONESUR NEGATIVE 06/09/2022 1426   PROTEINUR NEGATIVE 06/09/2022 1426   UROBILINOGEN 0.2 09/13/2021 1203   NITRITE NEGATIVE 06/09/2022 1426   LEUKOCYTESUR TRACE (A) 06/09/2022 1426   Sepsis Labs: '@LABRCNTIP'$ (procalcitonin:4,lacticidven:4)  ) Recent Results (from the past 240 hour(s))  Resp Panel by RT-PCR (Flu A&B, Covid) Anterior Nasal Swab     Status: None   Collection Time: 08/04/22  1:50 PM   Specimen: Anterior Nasal Swab  Result Value Ref Range Status   SARS Coronavirus 2 by RT PCR NEGATIVE NEGATIVE Final    Comment: (NOTE) SARS-CoV-2 target nucleic acids are NOT DETECTED.  The SARS-CoV-2 RNA is generally detectable in upper respiratory specimens during the acute phase of infection. The lowest concentration of SARS-CoV-2 viral copies this assay can detect is 138 copies/mL. A negative result does  not preclude SARS-Cov-2 infection and should not be used as the sole basis for treatment or other patient management decisions. A negative result may occur with  improper specimen collection/handling, submission of specimen other than nasopharyngeal swab, presence of viral mutation(s) within the areas targeted by this assay, and inadequate number of viral copies(<138 copies/mL). A negative result must be combined with clinical observations, patient history, and epidemiological information. The expected result is Negative.  Fact Sheet for Patients:  EntrepreneurPulse.com.au  Fact Sheet for Healthcare Providers:  IncredibleEmployment.be  This test is no t yet approved or cleared by the [place] FDA and  has been authorized for detection and/or diagnosis of SARS-CoV-2 by FDA under an Emergency Use Authorization (EUA). This EUA will remain  in effect (meaning this test can be used) for the duration of the COVID-19 declaration under Section 564(b)(1) of the Act, 21 U.S.C.section 360bbb-3(b)(1), unless the authorization is terminated  or revoked sooner.       Influenza A by PCR NEGATIVE NEGATIVE Final   Influenza B by PCR NEGATIVE NEGATIVE Final    Comment: (NOTE) The Xpert Xpress SARS-CoV-2/FLU/RSV plus assay is  intended as an aid in the diagnosis of influenza from Nasopharyngeal swab specimens and should not be used as a sole basis for treatment. Nasal washings and aspirates are unacceptable for Xpert Xpress SARS-CoV-2/FLU/RSV testing.  Fact Sheet for Patients: EntrepreneurPulse.com.au  Fact Sheet for Healthcare Providers: IncredibleEmployment.be  This test is not yet approved or cleared by the [place] FDA and has been authorized for detection and/or diagnosis of SARS-CoV-2 by FDA under an Emergency Use Authorization (EUA). This EUA will remain in effect (meaning this test can be used) for the  duration of the COVID-19 declaration under Section 564(b)(1) of the Act, 21 U.S.C. section 360bbb-3(b)(1), unless the authorization is terminated or revoked.  Performed at [facility], [address] [place], [place] [address]      Radiology Studies: DG Chest 2 View  Result Date: 08/04/2022 CLINICAL DATA:  Cough and sinus congestion EXAM: CHEST - 2 VIEW COMPARISON:  06/09/2022 FINDINGS: LEFT-sided pacemaker overlies normal cardiac silhouette. There is central venous congestion peribronchial thickening. Lungs are hyperinflated. No pleural fluid. LEFT shoulder prosthetic IMPRESSION: 1. Central venous congestion. 2. Peribronchial thickening without focal consolidation. Potential bronchitis. 3. Hyperinflated lungs. Electronically Signed   By: Suzy Bouchard M.D.   On: 08/04/2022 14:31     Scheduled Meds:  alum & mag hydroxide-simeth  30 mL Oral Once   apixaban  2.5 mg Oral BID   cefdinir  300 mg Oral Daily   furosemide  20 mg Intravenous BID   hydrALAZINE  50 mg Oral Q8H   ipratropium-albuterol  3 mL Nebulization QID   rosuvastatin  40 mg Oral Daily   Continuous Infusions:   LOS: 1 day    Time spent: 33mn  PDomenic Polite MD [facility]   08/05/2022, 9:13 AM

## 2022-08-05 NOTE — TOC Benefit Eligibility Note (Signed)
Patient Teacher, English as a foreign language completed.    The patient is currently admitted and upon discharge could be taking Farxiga 10 mg.  The current 30 day co-pay is $151.18.   The patient is currently admitted and upon discharge could be taking Jardiance 10 mg.  The current 30 day co-pay is $158.66.   The patient is insured through [place], [place] Patient Advocate Specialist [place] Patient Advocate Team Direct Number: [phone]  Fax: [phone]

## 2022-08-05 NOTE — Telephone Encounter (Signed)
Pharmacy Patient Advocate Encounter  Insurance verification completed.    The patient is insured through [employer] Medicare Part D   The patient is currently admitted and ran test claims for the following: Farxiga, Jardiance.  Copays and coinsurance results were relayed to Inpatient clinical team.  

## 2022-08-05 NOTE — TOC Initial Note (Signed)
Transition of Care [facility]) - Initial/Assessment Note    Patient Details  Name: Barbara [name] MRN: [id number] Date of Birth: 12-24-1940  [facility]) CM/SW Contact:    Bethena Roys, RN Phone Number: 08/05/2022, 4:46 PM  Clinical Narrative:  Case Manager spoke with the patient regarding disposition needs. PTA patient was from home alone. Patient states she has family (sister-in-law) and friends that check in on her as needed. Patient has a cane, rolling walker, and CPAP in the home. Patient was previously active with [facility] for [facility] PT/RN services. Patient does not feel that she needs any HH PT at this time. Patient states she has transportation to appointments. Case Manager will continue to follow for additional transition of care needs as the patient progresses.                          Expected Discharge Plan: Home/Self Care Barriers to Discharge: Continued Medical Work up   Patient Goals and CMS Choice Patient states their goals for this hospitalization and ongoing recovery are:: to return home   Choice offered to / list presented to : NA  Expected Discharge Plan and Services Expected Discharge Plan: Home/Self Care In-house Referral: NA Discharge Planning Services: CM Consult   Living arrangements for the past 2 months: Single Family Home                 DME Arranged: N/A DME Agency: NA       HH Arranged: NA     Prior Living Arrangements/Services Living arrangements for the past 2 months: Single Family Home Lives with:: Self (patient states her sister-in-law and friends check in on her.) Patient language and need for interpreter reviewed:: Yes        Need for Family Participation in Patient Care: Yes (Comment) Care giver support system in place?: Yes (comment) Current home services: DME (Patient has cane and wears CPAP at night.) Criminal Activity/Legal Involvement Pertinent to Current Situation/Hospitalization: No - Comment as needed     Permission Sought/Granted Permission sought to share information with : Case Manager, Family Supports   Emotional Assessment Appearance:: Appears stated age Attitude/Demeanor/Rapport: Engaged Affect (typically observed): Appropriate Orientation: : Oriented to Situation, Oriented to  Time, Oriented to Self, Oriented to Place Alcohol / Substance Use: Not Applicable Psych Involvement: No (comment)  Admission diagnosis:  Acute respiratory failure with hypoxia ([place]) [J96.01] Acute congestive heart failure, unspecified heart failure type ([place]) [I50.9] Acute on chronic diastolic (congestive) heart failure ([place]) [I50.33] Patient Active Problem List   Diagnosis Date Noted   Acute on chronic diastolic (congestive) heart failure ([place]) 08/04/2022   Acute hypoxemic respiratory failure ([place]) 08/04/2022   Bronchitis 08/04/2022   Ureteral stone 06/26/2022   Renal stones 06/26/2022   S/P cystoscopy 06/25/2022   Nephrolithiasis, 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, 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 in size, leading to moderate left hydroureteronephrosis and significant left perinephric stranding /edema 06/09/2022   OSA (obstructive sleep apnea) 06/09/2022   Tachycardia-bradycardia syndrome ([place]    Acquired trigger finger of right index finger 05/10/2021   Trigger thumb of left hand 05/10/2021   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   Atrial fibrillation ([place] 10/08/2020   Persistent atrial fibrillation ([place] 10/07/2020   CKD (chronic kidney disease), stage III ([place] 05/20/2020   Apical variant hypertrophic cardiomyopathy ([place]    Chest pain of uncertain etiology    Chest pain    S/P reverse  total shoulder arthroplasty, 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   Encounter for orthopedic follow-up care 02/18/2020   PAF (paroxysmal atrial fibrillation) ([place]) 02/28/2019   Demand myocardial infarction ([place]) 02/25/2019   Pain in left knee 01/06/2018   Dysphonia 12/29/2017   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    Acute on chronic diastolic CHF (congestive heart failure) ([place]) 11/16/2017   Gastroesophageal reflux disease 11/06/2017   Bradycardia 12/27/2015   Exertional dyspnea 12/23/2014   Sinus bradycardia 09/13/2014   LUMBAGO 05/03/2010   VERTIGO 01/21/2009   TEMPOROMANDIBULAR JOINT PAIN 08/31/2008   Osteoarthritis 05/31/2008   TROCHANTERIC BURSITIS 05/31/2008   Essential hypertension 04/06/2007   CAD (coronary artery disease/elevated troponin 04/06/2007   ALLERGIC RHINITIS 04/06/2007   Personal history of colonic polyps 04/06/2007   Hyperlipidemia 04/06/2007   PCP:  Laurey Morale, MD Pharmacy:   [facility], [place] [address] Phone: [phone] Fax: [phone]  [facility] ([place], [place] [facility] [place] [address] Phone: [phone] Fax: [phone]  [facility] [address] [place] [address] Phone: [phone] Fax: [phone]     Social Determinants of Health (SDOH) Interventions Food Insecurity Interventions: Intervention Not Indicated Housing Interventions: Intervention Not Indicated Transportation Interventions: Intervention Not Indicated Utilities Interventions: Intervention Not Indicated Alcohol Usage Interventions: Intervention Not Indicated (Score <7) Financial Strain Interventions: Intervention Not Indicated  Readmission Risk Interventions     No data to display

## 2022-08-05 NOTE — Progress Notes (Signed)
Heart Failure Nurse Navigator Progress Note  PCP: Laurey Morale, MD PCP-Cardiologist: Claiborne Billings Admission Diagnosis: Acute respiratory failure with hypoxia, Acute congestive heart failure Admitted from: Home  Presentation:   Barbara [name] presented with shortness of breath, bilateral lower extremity edema, can barely walk from the dyspnea. BP 172/58, HR 60,  BNP 1'908, Troponin 56, EKG A-paced, IV lasix and hydralizine given, CXR with central venus congestion.   Patient was educated on the sign and symptoms of heart failure, daily weights, when to call her doctor or go to the ER, taking all her medications as prescribed, patient stated she missed both Saturday and Sundays lasix pills because she wasn't feeling, well. Diet/ fluid restrictions, Patient states she does eat a number of fast foods multiple times a week. Patient is scheduled for a HF TOC appointment on 08/14/2022 @ 12 noon.   ECHO/ LVEF: >75%   Clinical Course:  Past Medical History:  Diagnosis Date   A-fib [facility])    Allergy    CAD (coronary artery disease)    sees Dr. Shelva Majestic  cardiac stents - 2000   CHF (congestive heart failure) ([place])    Colon polyps    Complication of anesthesia    rash/hives with "caines"   Dyspnea    02/12/18 " when my heart gets out of rhythm, it has not been out of rhythym- since I have been on Tikosyn (11/2017)   Dysrhythmia    afib fib   GERD (gastroesophageal reflux disease)    takes OTC- Omeprazole- prn   Heart murmur    History of kidney stones    History of stress test    show normal perfusion without scar or ischemia, post EF 68%   Hx of echocardiogram    show an EF 55%-60% range with grade 1 diastolic dysfunction, she had mitral anular calcification with mild MR, moderate LA dilation and mild pulmonary hypertension with a PA estimated pressure of 57m   Hyperlipidemia    Hypertension    NSTEMI (non-ST elevated myocardial infarction) ([place]    Osteoarthritis    Pacemaker     Pneumonia    hx of 2015    PONV (postoperative nausea and vomiting)    Sleep apnea      Social History   Socioeconomic History   Marital status: Widowed    Spouse name: Not on file   Number of children: 2   Years of education: Not on file   Highest education level: High school graduate  Occupational History   Occupation: Retired  Tobacco Use   Smoking status: Former    Years: 40.00    Types: Cigarettes    Quit date: 11/04/1996    Years since quitting: 25.7   Smokeless tobacco: Never  Vaping Use   Vaping Use: Never used  Substance and Sexual Activity   Alcohol use: No    Alcohol/week: 0.0 standard drinks of alcohol   Drug use: No   Sexual activity: Not Currently  Other Topics Concern   Not on file  Social History Narrative   Not on file   Social Determinants of Health   Financial Resource Strain: Low Risk  (08/05/2022)   Overall Financial Resource Strain (CARDIA)    Difficulty of Paying Living Expenses: Not hard at all  Food Insecurity: No Food Insecurity (08/05/2022)   Hunger Vital Sign    Worried About Running Out of Food in the Last Year: Never true    RManningin the Last  Year: Never true  Transportation Needs: No Transportation Needs (08/05/2022)   PRAPARE - Hydrologist (Medical): No    Lack of Transportation (Non-Medical): No  Physical Activity: Insufficiently Active (07/02/2022)   Exercise Vital Sign    Days of Exercise per Week: 1 day    Minutes of Exercise per Session: 20 min  Stress: No Stress Concern Present (07/02/2022)   [place]    Feeling of Stress : Not at all  Social Connections: Moderately Integrated (07/02/2022)   Social Connection and Isolation Panel [NHANES]    Frequency of Communication with Friends and Family: More than three times a week    Frequency of Social Gatherings with Friends and Family: More than three times a week    Attends  Religious Services: More than 4 times per year    Active Member of [employer] or Organizations: Yes    Attends Archivist Meetings: More than 4 times per year    Marital Status: Widowed   Education Assessment and Provision:  Detailed education and instructions provided on heart failure disease management including the following:  Signs and symptoms of Heart Failure When to call the physician Importance of daily weights Low sodium diet Fluid restriction Medication management Anticipated future follow-up appointments  Patient education given on each of the above topics.  Patient acknowledges understanding via teach back method and acceptance of all instructions.  Education Materials:  "Living Better With Heart Failure" Booklet, HF zone tool, & Daily Weight Tracker Tool.  Patient has scale at home: yes Patient has pill box at home: yes    High Risk Criteria for Readmission and/or Poor Patient Outcomes: Heart failure hospital admissions (last 6 months): 1  No Show rate: 2% Difficult social situation: No Demonstrates medication adherence: No, Lasix Primary Language: English Literacy level: Reading, writing, and comprehension.   Barriers of Care:   Diet/ fluid restrictions ( Fast Food) Daily weights Taking all medications as prescribed  Considerations/Referrals:   Referral made to Heart Failure Pharmacist Stewardship: yes Referral made to Heart Failure CSW/NCM TOC: No Referral made to Heart & Vascular TOC clinic: Yes, 08/14/2022 @ 12 noon.   Items for Follow-up on DC/TOC: Diet/ fluid restrictions ( fast foods) Daily weights Taking all medications as prescribed   Earnestine Leys, BSN, RN Heart Failure Leisure centre manager Chat Only

## 2022-08-06 ENCOUNTER — Inpatient Hospital Stay (HOSPITAL_COMMUNITY): Payer: Medicare Other

## 2022-08-06 DIAGNOSIS — I5033 Acute on chronic diastolic (congestive) heart failure: Secondary | ICD-10-CM | POA: Diagnosis not present

## 2022-08-06 LAB — BASIC METABOLIC PANEL
Anion gap: 8 (ref 5–15)
BUN: 21 mg/dL (ref 8–23)
CO2: 26 mmol/L (ref 22–32)
Calcium: 9.5 mg/dL (ref 8.9–10.3)
Chloride: 105 mmol/L (ref 98–111)
Creatinine, Ser: 1.69 mg/dL — ABNORMAL HIGH (ref 0.44–1.00)
GFR, Estimated: 30 mL/min — ABNORMAL LOW (ref >60)
Glucose, Bld: 107 mg/dL — ABNORMAL HIGH (ref 70–99)
Potassium: 3.7 mmol/L (ref 3.5–5.1)
Sodium: 139 mmol/L (ref 135–145)

## 2022-08-06 LAB — CBC
HCT: 32.5 % — ABNORMAL LOW (ref 36.0–46.0)
Hemoglobin: 10.9 g/dL — ABNORMAL LOW (ref 12.0–15.0)
MCH: 28.7 pg (ref 26.0–34.0)
MCHC: 33.5 g/dL (ref 30.0–36.0)
MCV: 85.5 fL (ref 80.0–100.0)
Platelets: 162 10*3/uL (ref 150–400)
RBC: 3.8 MIL/uL — ABNORMAL LOW (ref 3.87–5.11)
RDW: 14.1 % (ref 11.5–15.5)
WBC: 8.6 10*3/uL (ref 4.0–10.5)
nRBC: 0 % (ref 0.0–0.2)

## 2022-08-06 LAB — MAGNESIUM: Magnesium: 1.9 mg/dL (ref 1.7–2.4)

## 2022-08-06 MED ORDER — POTASSIUM CHLORIDE CRYS ER 20 MEQ PO TBCR
40.0000 meq | EXTENDED_RELEASE_TABLET | Freq: Once | ORAL | Status: AC
  Administered 2022-08-06: 40 meq via ORAL
  Filled 2022-08-06: qty 2

## 2022-08-06 MED ORDER — EMPAGLIFLOZIN 10 MG PO TABS
10.0000 mg | ORAL_TABLET | Freq: Every day | ORAL | Status: DC
  Administered 2022-08-06 – 2022-08-08 (×3): 10 mg via ORAL
  Filled 2022-08-06 (×3): qty 1

## 2022-08-06 MED ORDER — SALINE SPRAY 0.65 % NA SOLN
1.0000 | NASAL | Status: DC | PRN
  Administered 2022-08-06: 1 via NASAL
  Filled 2022-08-06: qty 44

## 2022-08-06 NOTE — Progress Notes (Signed)
Modified Barium Swallow Progress Note  Patient Details  Name: Barbara [name] MRN: [id number] Date of Birth: [birth year]  Today's Date: 08/06/2022  Modified Barium Swallow completed.  Full report located under Chart Review in the Imaging Section.  Brief recommendations include the following:  Clinical Impression  Pt presents with grossly normal pharyngeal swallow function.  There was no penetration or aspiration of any consistencies trialed, including large straw sips of thin liquid.  There was no stasis observed with any consistencies and no pharyngeal residue.  Tablet transited through esophagus during pill simulation without any stasis on esophageal sweep.  Possible cervical osteophytes observed (confirmation needed by radiology) without impeding bolus flow.  Pt has no further ST needs.  SLP will sign off at this time.  Recommend continuing regular texture diet with thin liquids.     Swallow Evaluation Recommendations       SLP Diet Recommendations: Regular solids;Thin liquid   Liquid Administration via: Cup;Straw   Medication Administration: Whole meds with liquid   Supervision: Patient able to self feed   Compensations: Slow rate;Small sips/bites   Postural Changes: Seated upright at 90 degrees   Oral Care Recommendations: Oral care BID        Celedonio Savage, [place], [place] Office: [phone] 08/06/2022,1:05 PM

## 2022-08-06 NOTE — Evaluation (Signed)
Physical Therapy Evaluation Patient Details Name: Barbara [name] MRN: [id number] DOB: October 22, 1941 Today's Date: 08/06/2022  History of Present Illness  Pt is an 81 y/o F admitted on 08/04/22 after presenting to the ED with c/o SOB. Chest x-ray noted central vascular congestion & peribronchial thickening. Pt is being treated for acute on chronic diastolic CHF, acute hypoxemic respiratory failure, fever & bronchitis. 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 On this date, pt is on room air with SpO2 >90% throughout & PT educating pt on pursed lip breathing. Pt is able to complete bed mobility with supervision & STS from EOB & standard chair with extra time & supervision (pt reports she sits in lift chair at home). Pt ambulates around bed to sink & back without AD & supervision with PRN use of furniture with PT educating to attempt without. Pt declines walking into the hallway, noting she is weak, but is improving. Pt reports her brother in law can assist PRN at d/c. Will continue to follow pt acutely to address endurance, balance, and gait & stairs with LRAD.       Recommendations for follow up therapy are one component of a multi-disciplinary discharge planning process, led by the attending physician.  Recommendations may be updated based on patient status, additional functional criteria and insurance authorization.  Follow Up Recommendations Home health PT      Assistance Recommended at Discharge PRN  Patient can return home with the following  A little help with walking and/or transfers;A little help with bathing/dressing/bathroom;Assistance with cooking/housework;Assist for transportation;Help with stairs or ramp for entrance     Equipment Recommendations None recommended by PT  Recommendations for Other Services       Functional Status Assessment Patient has had a recent decline in their functional status and demonstrates the ability to make significant improvements in function in a reasonable and predictable amount of time.     Precautions / Restrictions Precautions Precautions: Fall Restrictions Weight Bearing Restrictions: No      Mobility  Bed Mobility Overal bed mobility: Needs Assistance Bed Mobility: Supine to Sit     Supine to sit: Supervision, HOB elevated     General bed mobility comments: slightly increased time for supine>sit with HOB slightly elevated    Transfers Overall transfer level: Needs assistance Equipment used: None Transfers: Sit to/from Stand Sit to Stand: Supervision           General transfer comment: STS from EOB & standard chair with armrests with supervision with extra time to power up (pt reports she only sits in lift chair at home)    Ambulation/Gait Ambulation/Gait assistance: Supervision Gait Distance (Feet): 10 Feet (+ 10 ft) Assistive device: None Gait Pattern/deviations: Decreased step length - right, Decreased step length - left, Decreased stride length Gait velocity: decreased     General Gait Details: Pt attempting to reach for objects for support, PT educates pt not to hold to things. Pt without overt LOB. Declines walking in the hallway.  Stairs            Wheelchair Mobility    Modified Rankin (Stroke Patients Only)       Balance Overall balance assessment: Needs assistance Sitting-balance support: Feet supported Sitting balance-Leahy  Scale: Good     Standing balance support: During functional activity, No upper extremity supported Standing balance-Leahy Scale: Good                               Pertinent Vitals/Pain Pain Assessment Pain Assessment: No/denies pain    Home Living Family/patient expects to be  discharged to:: Private residence Living Arrangements: Alone Available Help at Discharge: Family;Friend(s);Available PRN/intermittently Type of Home: House Home Access: Stairs to enter Entrance Stairs-Rails: Right Entrance Stairs-Number of Steps: 2   Home Layout: One level Home Equipment: Cane - single Barista (2 wheels);Shower seat;BSC/3in1;Rollator (4 wheels) (lift chair) Additional Comments: pt reports son lives in [place]    Prior Function Prior Level of Function : Independent/Modified Independent;Driving             Mobility Comments: Pt reports she's ambulatory with SPC PRN (sometimes forgets it in the house), denies falls. ADLs Comments: Reports she is independent with cooking & driving.     Hand Dominance        Extremity/Trunk Assessment   Upper Extremity Assessment Upper Extremity Assessment: Overall WFL for tasks assessed    Lower Extremity Assessment Lower Extremity Assessment: Generalized weakness       Communication   Communication: No difficulties  Cognition Arousal/Alertness: Awake/alert Behavior During Therapy: WFL for tasks assessed/performed Overall Cognitive Status: Within Functional Limits for tasks assessed                                          General Comments General comments (skin integrity, edema, etc.): Pt on room air throughout session with SpO2 >90%, PT educates pt on pursed lip breathing techniques.    Exercises     Assessment/Plan    PT Assessment Patient needs continued PT services  PT Problem List Decreased strength;Cardiopulmonary status limiting activity;Decreased activity tolerance;Decreased balance;Decreased mobility;Decreased knowledge of use of DME       PT Treatment Interventions DME instruction;Therapeutic exercise;Gait training;Balance training;Stair training;Neuromuscular re-education;Functional mobility training;Therapeutic activities;Patient/family education    PT Goals (Current  goals can be found in the Care Plan section)  Acute Rehab PT Goals Patient Stated Goal: get better, go home PT Goal Formulation: With patient Time For Goal Achievement: 08/20/22 Potential to Achieve Goals: Good    Frequency Min 3X/week     Co-evaluation               AM-PAC PT "6 Clicks" Mobility  Outcome Measure Help needed turning from your back to your side while in a flat bed without using bedrails?: None Help needed moving from lying on your back to sitting on the side of a flat bed without using bedrails?: A Little Help needed moving to and from a bed to a chair (including a wheelchair)?: A Little Help needed standing up from a chair using your arms (e.g., wheelchair or bedside chair)?: A Little Help needed to walk in hospital room?: Total Help needed climbing 3-5 steps with a railing? : A Little 6 Click Score: 17    End of Session   Activity Tolerance: Patient tolerated treatment well Patient left: in chair;with call bell/phone within reach;with chair alarm set;with nursing/sitter in room Nurse Communication: Mobility status (O2) PT Visit Diagnosis: Muscle weakness (generalized) (M62.81);Unsteadiness on feet (R26.81)    Time: 6962-9528 PT Time Calculation (min) (ACUTE ONLY): 17 min  Charges:   PT Evaluation $PT Eval Moderate Complexity: [place], DPT 08/06/22, 2:02 PM   Waunita Schooner 08/06/2022, 2:01 PM

## 2022-08-06 NOTE — Progress Notes (Signed)
PROGRESS NOTE    EUDELIA HILTUNEN  HEN:[id number] DOB: [birth year] DOA: 08/04/2022 PCP: Laurey Morale, MD  Barbara [name] is a 81/F w/ h/o paroxysmal A-fib on Eliquis, chronic diastolic CHF, GERD, CAD status post PCI, hypertension, hyperlipidemia, CKD stage IIIb, 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  History of cough, mild swelling, low-grade fever as well. -In the emergency room she was hypoxic 85 to 89% on room air, labs noted creatinine of 1.5, troponin 56, flu and COVID PCR were negative, BNP was 11/12/2006, chest x-ray noted central vascular congestion and peribronchial thickening -Started on diuretics, then developed fever of 102 with productive cough  Subjective: -Feels tired, overall improving, breathing not back to baseline  Assessment and Plan:  Acute on chronic diastolic CHF Acute hypoxemic respiratory failure -Echo done in July 2023 showing EF >75%, moderate MR. -Patient reports missing Lasix for the last few days -Continue IV Lasix today, she is 2.2 L negative -Add Jardiance -Monitor I's/O, daily weights -Attempt to wean off O2, add incentive spirometry -Ambulate, PT OT eval   Fever, bronchitis Chest x-ray showing peribronchial thickening without focal consolidation -Temp of 102 yesterday, COVID and flu PCR negative -Continue cefdinir day 2 and DuoNebs   Paroxysmal A-fib Has a pacemaker. -Continue metoprolol, amiodarone, and Eliquis -Decreased metoprolol dose   Tachy-brady syndrome status post PPM 05/2022 -Continue amiodarone   CAD status post PCI Troponin elevation likely due to demand ischemia in setting of decompensated CHF.  Troponins mildly elevated and flat, not consistent with ACS.  Patient is not endorsing chest pain.  Cath done in July 2023 showing mild to moderate nonobstructive CAD and patent proximal/mid LAD stent with up to 30% in-stent restenosis. -Continue aspirin,  metoprolol, Imdur, and Crestor    Hypertension -Stable, meds as above   Hyperlipidemia -Continue Crestor    CKD stage IIIb Creatinine stable. -Continue to monitor   OSA -Continue nightly CPAP   DVT prophylaxis: Eliquis Code Status: Full Code (discussed with the patient) Family Communication: Discussed with patient detail, no family at bedside Disposition:, Home likely 1 to 2 days  Consultants:    Procedures:   Antimicrobials:    Objective: Vitals:   08/05/22 1502 08/05/22 2056 08/06/22 0500 08/06/22 0845  BP: (!) 155/64 (!) 144/55 (!) 159/63 (!) 169/68  Pulse: 60 60 60 60  Resp: '16 16 16 14  '$ Temp: 98.5 F (36.9 C) 98.6 F (37 C) 99.3 F (37.4 C) 98.3 F (36.8 C)  TempSrc: Oral Oral Oral Oral  SpO2: 96% 98%  92%  Weight:      Height:        Intake/Output Summary (Last 24 hours) at 08/06/2022 1136 Last data filed at 08/06/2022 1049 Gross per 24 hour  Intake --  Output 1400 ml  Net -1400 ml   Filed Weights   08/04/22 1344 08/04/22 2105  Weight: 78.9 kg 79.9 kg    Examination:  General exam: Pleasant elderly female sitting up in bed, AAOx3, no distress HEENT: Positive JVD CVS: S1-S2, regular rhythm Lungs: Few scattered rhonchi and basilar rales Abdomen: Soft, nontender, bowel sounds present Extremities: No edema Skin: No rashes Psychiatry:  Mood & affect appropriate.     Data Reviewed:   CBC: Recent Labs  Lab 08/04/22 1348 08/06/22 0153  WBC 9.6 8.6  HGB 10.3* 10.9*  HCT 32.5* 32.5*  MCV 86.4 85.5  PLT 160 361   Basic Metabolic Panel: Recent Labs  Lab  08/04/22 1348 08/05/22 0326 08/06/22 0153  NA 140 140 139  K 3.6 3.2* 3.7  CL 105 103 105  CO2 '24 24 26  '$ GLUCOSE 126* 105* 107*  BUN '19 16 21  '$ CREATININE 1.51* 1.64* 1.69*  CALCIUM 10.0 10.0 9.5  MG  --   --  1.9   GFR: Estimated Creatinine Clearance: 27.4 mL/min (A) (by C-G formula based on SCr of 1.69 mg/dL (H)). Liver Function Tests: Recent Labs  Lab 08/04/22 1632  AST  31  ALT 23  ALKPHOS 74  BILITOT 1.2  PROT 7.3  ALBUMIN 4.0   No results for input(s): "LIPASE", "AMYLASE" in the last 168 hours. No results for input(s): "AMMONIA" in the last 168 hours. Coagulation Profile: No results for input(s): "INR", "PROTIME" in the last 168 hours. Cardiac Enzymes: No results for input(s): "CKTOTAL", "CKMB", "CKMBINDEX", "TROPONINI" in the last 168 hours. BNP (last 3 results) No results for input(s): "PROBNP" in the last 8760 hours. HbA1C: No results for input(s): "HGBA1C" in the last 72 hours. CBG: No results for input(s): "GLUCAP" in the last 168 hours. Lipid Profile: No results for input(s): "CHOL", "HDL", "LDLCALC", "TRIG", "CHOLHDL", "LDLDIRECT" in the last 72 hours. Thyroid Function Tests: No results for input(s): "TSH", "T4TOTAL", "FREET4", "T3FREE", "THYROIDAB" in the last 72 hours. Anemia Panel: No results for input(s): "VITAMINB12", "FOLATE", "FERRITIN", "TIBC", "IRON", "RETICCTPCT" in the last 72 hours. Urine analysis:    Component Value Date/Time   COLORURINE YELLOW 06/09/2022 1426   APPEARANCEUR CLOUDY (A) 06/09/2022 1426   LABSPEC 1.010 06/09/2022 1426   PHURINE 6.5 06/09/2022 1426   GLUCOSEU NEGATIVE 06/09/2022 1426   GLUCOSEU NEGATIVE 09/13/2021 1203   HGBUR LARGE (A) 06/09/2022 1426   HGBUR negative 10/17/2010 0859   BILIRUBINUR NEGATIVE 06/09/2022 1426   BILIRUBINUR neg 08/01/2021 1037   KETONESUR NEGATIVE 06/09/2022 1426   PROTEINUR NEGATIVE 06/09/2022 1426   UROBILINOGEN 0.2 09/13/2021 1203   NITRITE NEGATIVE 06/09/2022 1426   LEUKOCYTESUR TRACE (A) 06/09/2022 1426   Sepsis Labs: '@LABRCNTIP'$ (procalcitonin:4,lacticidven:4)  ) Recent Results (from the past 240 hour(s))  Resp Panel by RT-PCR (Flu A&B, Covid) Anterior Nasal Swab     Status: None   Collection Time: 08/04/22  1:50 PM   Specimen: Anterior Nasal Swab  Result Value Ref Range Status   SARS Coronavirus 2 by RT PCR NEGATIVE NEGATIVE Final    Comment:  (NOTE) SARS-CoV-2 target nucleic acids are NOT DETECTED.  The SARS-CoV-2 RNA is generally detectable in upper respiratory specimens during the acute phase of infection. The lowest concentration of SARS-CoV-2 viral copies this assay can detect is 138 copies/mL. A negative result does not preclude SARS-Cov-2 infection and should not be used as the sole basis for treatment or other patient management decisions. A negative result may occur with  improper specimen collection/handling, submission of specimen other than nasopharyngeal swab, presence of viral mutation(s) within the areas targeted by this assay, and inadequate number of viral copies(<138 copies/mL). A negative result must be combined with clinical observations, patient history, and epidemiological information. The expected result is Negative.  Fact Sheet for Patients:  EntrepreneurPulse.com.au  Fact Sheet for Healthcare Providers:  IncredibleEmployment.be  This test is no t yet approved or cleared by the [place] FDA and  has been authorized for detection and/or diagnosis of SARS-CoV-2 by FDA under an Emergency Use Authorization (EUA). This EUA will remain  in effect (meaning this test can be used) for the duration of the COVID-19 declaration under Section 564(b)(1) of the Act,  21 U.S.C.section 360bbb-3(b)(1), unless the authorization is terminated  or revoked sooner.       Influenza A by PCR NEGATIVE NEGATIVE Final   Influenza B by PCR NEGATIVE NEGATIVE Final    Comment: (NOTE) The Xpert Xpress SARS-CoV-2/FLU/RSV plus assay is intended as an aid in the diagnosis of influenza from Nasopharyngeal swab specimens and should not be used as a sole basis for treatment. Nasal washings and aspirates are unacceptable for Xpert Xpress SARS-CoV-2/FLU/RSV testing.  Fact Sheet for Patients: EntrepreneurPulse.com.au  Fact Sheet for Healthcare  Providers: IncredibleEmployment.be  This test is not yet approved or cleared by the [place] FDA and has been authorized for detection and/or diagnosis of SARS-CoV-2 by FDA under an Emergency Use Authorization (EUA). This EUA will remain in effect (meaning this test can be used) for the duration of the COVID-19 declaration under Section 564(b)(1) of the Act, 21 U.S.C. section 360bbb-3(b)(1), unless the authorization is terminated or revoked.  Performed at [facility], [address] [place], [place] [address]      Radiology Studies: DG Chest 2 View  Result Date: 08/04/2022 CLINICAL DATA:  Cough and sinus congestion EXAM: CHEST - 2 VIEW COMPARISON:  06/09/2022 FINDINGS: LEFT-sided pacemaker overlies normal cardiac silhouette. There is central venous congestion peribronchial thickening. Lungs are hyperinflated. No pleural fluid. LEFT shoulder prosthetic IMPRESSION: 1. Central venous congestion. 2. Peribronchial thickening without focal consolidation. Potential bronchitis. 3. Hyperinflated lungs. Electronically Signed   By: Suzy Bouchard M.D.   On: 08/04/2022 14:31     Scheduled Meds:  alum & mag hydroxide-simeth  30 mL Oral Once   amiodarone  200 mg Oral Daily   apixaban  2.5 mg Oral BID   cefdinir  300 mg Oral Daily   empagliflozin  10 mg Oral Daily   furosemide  20 mg Intravenous BID   hydrALAZINE  25 mg Oral TID   metoprolol succinate  25 mg Oral Daily   rosuvastatin  40 mg Oral Daily   Continuous Infusions:   LOS: 2 days    Time spent: 62mn  PDomenic Polite MD [facility]   08/06/2022, 11:36 AM

## 2022-08-06 NOTE — Telephone Encounter (Signed)
Pt orders have been faxed as requested

## 2022-08-06 NOTE — Progress Notes (Signed)
Heart Failure Stewardship Pharmacist Progress Note   PCP: Laurey Morale, MD PCP-Cardiologist: Shelva Majestic, MD    HPI:  81 yo F with PMH of afib, HFpEF, GERD, CAD s/p PCI, HTN, HLD, CKD IIIb, tachy-brady syndrome s/p PPM, and OSA on CPAP.  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 ECHO 05/2022 with LVEF >75%, no regional wall motion abnormalities, elevated LA pressures, RV normal, elevated PA pressures, moderate MR, and RA pressure of 3 mmHg. LHC at that time with mild to moderate non-obstructive CAD.   Presented to the ED on 10/1 with shortness of breath and LE edema. BNP elevated 1908.9. CXR with central venous congestion and peribronchial thickening without focal consolidation. Admitted for acute on chronic diastolic CHF and being treated for bronchitis.  Current HF Medications: Diuretic: furosemide 20 mg IV BID Beta Blocker: metoprolol XL 25 mg daily SGLT2i: Jardiance 10 mg daily Other: hydralazine 25 mg TID  Prior to admission HF Medications: Diuretic: furosemide 20 mg daily Beta blocker: metoprolol XL 50 mg daily Other: hydralazine 50 mg BID; Imdur 30 mg daily  Pertinent Lab Values: Serum creatinine 1.69, BUN 21, Potassium 3.7, Sodium 139, BNP 1908.9, Magnesium 1.9  Vital Signs: Weight: 176 lbs (admission weight: 176 lbs) Blood pressure: 160/60s  Heart rate: 60s  I/O: incomplete  Medication Assistance / Insurance Benefits Check: Does the patient have prescription insurance?  Yes Type of insurance plan: UHC Medicare  Does the patient qualify for medication assistance through manufacturers or grants?   Yes Eligible grants and/or patient assistance programs: Jardiance Medication assistance applications in progress: Jardiance  Medication assistance applications approved: none Approved medication assistance renewals will be completed by: pending  Outpatient Pharmacy:  Prior to admission outpatient pharmacy: [facility] Is the patient willing to use  [place] pharmacy at discharge? Yes Is the patient willing to transition their outpatient pharmacy to utilize a [facility] outpatient pharmacy?   Pending     Assessment: 1. Acute on chronic diastolic CHF (LVEF >49%), due to NICM. NYHA class III symptoms. - Continue furosemide 20 mg IV BID. Strict I/Os and daily weights. Goal K>4 and Mag>2. Consider replacing with KCl 40 mEq x1 and Mag 2g IV x 1 - Continue metoprolol XL 25 mg daily - Agree with adding Jardiance 10 mg daily, patient assistance started - Continue hydralazine 25 mg TID   Plan: 1) Medication changes recommended at this time: - KCl 40 mEq x 1 - Magnesium 2g IV x 1  2) Patient assistance: - Currently in the donut hole - Jardiance patient assistance initiated  3)  Education  - Patient has been educated on current HF medications and potential additions to HF medication regimen - Patient verbalizes understanding that over the next few months, these medication doses may change and more medications may be added to optimize HF regimen - Patient has been educated on basic disease state pathophysiology and goals of therapy   Kerby Nora, PharmD, BCPS Heart Failure Cytogeneticist Phone [phone]

## 2022-08-06 NOTE — Progress Notes (Signed)
Pt states that she frequently has coughing episodes when drinking liquids.  Order placed for swallow eval by SLP.  Will continue to monitor.  Jodell Cipro

## 2022-08-06 NOTE — Progress Notes (Signed)
Patient refuses CPAP 

## 2022-08-06 NOTE — Evaluation (Signed)
Clinical/Bedside Swallow Evaluation Patient Details  Name: Barbara [name] MRN: [id number] Date of Birth: 08/30/1941  Today's Date: 08/06/2022 Time: SLP Start Time (ACUTE ONLY): 1224 SLP Stop Time (ACUTE ONLY): 8250 SLP Time Calculation (min) (ACUTE ONLY): 12 min  Past Medical History:  Past Medical History:  Diagnosis Date   A-fib ([place])    Allergy    CAD (coronary artery disease)    sees Dr. Shelva Majestic  cardiac stents - 2000   CHF (congestive heart failure) ([place])    Colon polyps    Complication of anesthesia    rash/hives with "caines"   Dyspnea    02/12/18 " when my heart gets out of rhythm, it has not been out of rhythym- since I have been on Tikosyn (11/2017)   Dysrhythmia    afib fib   GERD (gastroesophageal reflux disease)    takes OTC- Omeprazole- prn   Heart murmur    History of kidney stones    History of stress test    show normal perfusion without scar or ischemia, post EF 68%   Hx of echocardiogram    show an EF 55%-60% range with grade 1 diastolic dysfunction, she had mitral anular calcification with mild MR, moderate LA dilation and mild pulmonary hypertension with a PA estimated pressure of 72m   Hyperlipidemia    Hypertension    NSTEMI (non-ST elevated myocardial infarction) ([place]    Osteoarthritis    Pacemaker    Pneumonia    hx of 2015    PONV (postoperative nausea and vomiting)    Sleep apnea    Past Surgical History:  Past Surgical History:  Procedure Laterality Date   CARDIAC CATHETERIZATION     11/2017   cardiac stents  2000   COLONOSCOPY  01-05-14   per Dr. JTeena Irani clear, no repeats needed    COLONOSCOPY     CORONARY STENT PLACEMENT  2000   in [place] URETEROSCOPY AND STENT PLACEMENT Left 06/11/2022   Procedure: CYSTOSCOPY WITH RETROGRADE PYELOGRAM, LEFT STENT PLACEMENT;  Surgeon: BLucas Mallow MD;  Location: [facility];  Service: Urology;  Laterality: Left;   CYSTOSCOPY/URETEROSCOPY/HOLMIUM  LASER/STENT PLACEMENT Left 06/25/2022   Procedure: CYSTOSCOPY LEFT URETEROSCOPY/HOLMIUM LASER/STENT PLACEMENT;  Surgeon: WIrine Seal MD;  Location: [facility];  Service: Urology;  Laterality: Left;  1 HR FOR THIS CASE   DIRECT LARYNGOSCOPY WITH RADIAESSE INJECTION N/A 02/13/2018   Procedure: DIRECT LARYNGOSCOPY WITH RADIAESSE INJECTION;  Surgeon: BMelida Quitter MD;  [facility]  Service: ENT;  Laterality: N/A;   EYE SURGERY Left    CATARACT REMOVAL   KNEE ARTHROSCOPY Left 01/06/2015   Procedure: LEFT KNEE ARTHROSCOPY, abrasion chondroplasty of the medial femerol condryl,medial and lateral menisectomy, microfracture , synovectomy of the suprpatellar pouch;  Surgeon: RLatanya Maudlin MD;  Location: [facility];  Service: Orthopedics;  Laterality: Left;   LEFT HEART CATH AND CORONARY ANGIOGRAPHY N/A 02/26/2019   Procedure: LEFT HEART CATH AND CORONARY ANGIOGRAPHY;  Surgeon: KTroy Sine MD;  [facility];  Service: Cardiovascular;  Laterality: N/A;   LEFT HEART CATH AND CORONARY ANGIOGRAPHY N/A 05/06/2022   Procedure: LEFT HEART CATH AND CORONARY ANGIOGRAPHY;  Surgeon: ENelva Bush MD;  [facility];  Service: Cardiovascular;  Laterality: N/A;   MICROLARYNGOSCOPY W/VOCAL CORD INJECTION N/A 08/07/2018   Procedure: MICROLARYNGOSCOPY WITH VOCAL CORD INJECTION OF PROLARYN;  Surgeon: BMelida Quitter MD;  Location: [facility]  Service: ENT;  Laterality: N/A;  JET VENTILATION   PACEMAKER IMPLANT N/A 05/08/2022   Procedure: PACEMAKER IMPLANT;  Surgeon: Constance Haw, MD;  Location: [facility];  Service: Cardiovascular;  Laterality: N/A;   REVERSE SHOULDER ARTHROPLASTY Right 03/23/2020   Procedure: REVERSE SHOULDER ARTHROPLASTY;  Surgeon: Nicholes Stairs, MD;  Location: [facility];  Service: Orthopedics;  Laterality: Right;   VAGINAL HYSTERECTOMY  1971   HPI:  Barbara [name] is a 81 y.o. female who presented to the ED with shortness of breath.  Hypertensive on  arrival with blood pressure 177/59.  Oxygen saturation 85-89% on room air, improved with 2 L supplemental oxygen.  Chest x-ray showing central venous congestion and peribronchial thickening without focal consolidation. 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    Assessment / Plan / Recommendation  Clinical Impression  Pt presents with clinical indicators of pharyngeal dysphagia.  Pt reports frequent cough when drinking and notices especially with pills. Today pt tolerated puree and solids with no clinical s/s of aspiration and exhibited good oral clearance.  With thin liquid pt tolerated serial straw sips, but after the the swallow there was a delayed throat clear.  Pt stated that it felt like it was hung up and she was trying to clear it.  Pt indicated sternal notch when asked where the sensation occurs.  She states that this is consistent with past episodes, but adds that she also becomes short of breath.  Symptoms are seemingly consistent with esophageal dysphagia; however, given her respiratory complaints, MBS is recommended to further assess pharyngeal swallow function and allow for observation of cervical esophagus.  Pt's CXR concerning for bronchitis without focal consolidation.   Recommend resuming regular texture diet pending results of MBSS.  Pt with bleeding from IV site at end of session.  Pressure applied.  RN notified.    SLP Visit Diagnosis: Dysphagia, unspecified (R13.10)    Aspiration Risk  Mild aspiration risk    Diet Recommendation Regular;Thin liquid   Liquid Administration via: Cup;Straw Medication Administration: Whole meds with puree Supervision: Intermittent supervision to cue for compensatory strategies Compensations: Slow rate;Small sips/bites Postural Changes: Seated upright at 90  degrees;Remain upright for at least 30 minutes after po intake    Other  Recommendations      Recommendations for follow up therapy are one component of a multi-disciplinary discharge planning process, led by the attending physician.  Recommendations may be updated based on patient status, additional functional criteria and insurance authorization.  Follow up Recommendations  (pending results of MBS)      Assistance Recommended at Discharge  TBD  Functional Status Assessment Patient has had a recent decline in their functional status and demonstrates the ability to make significant improvements in function in a reasonable and predictable amount of time.  Frequency and Duration  (pending results of MBS)          Prognosis Prognosis for Safe Diet Advancement:  (pending results of mbs)      Swallow Study   General Date of Onset: 08/05/22 HPI: KAMESHA HERNE is a 81 y.o. female who presented to the ED with shortness of breath.  Hypertensive on arrival with blood pressure 177/59.  Oxygen saturation 85-89% on room air, improved with 2 L supplemental oxygen.  Chest x-ray showing central venous congestion and peribronchial thickening without focal consolidation. 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 Type of Study: Bedside Swallow Evaluation Previous Swallow Assessment: none Diet Prior to this Study: NPO Temperature Spikes Noted: No Respiratory Status: Nasal cannula History of Recent Intubation: No Behavior/Cognition: Alert;Cooperative;Pleasant mood Oral Cavity Assessment: Within Functional Limits Oral Care Completed by SLP: No Oral Cavity - Dentition: Adequate natural dentition Vision: Functional for self-feeding Self-Feeding Abilities: Able to feed self Patient Positioning: Upright in bed Baseline  Vocal Quality: Hoarse Volitional Cough: Strong Volitional Swallow: Able to elicit    Oral/Motor/Sensory Function Overall Oral Motor/Sensory Function: Within functional limits Facial ROM: Within Functional Limits Facial Symmetry: Within Functional Limits Lingual ROM: Within Functional Limits Lingual Symmetry: Within Functional Limits Lingual Strength: Within Functional Limits Velum: Within Functional Limits Mandible: Within Functional Limits   Ice Chips Ice chips: Not tested   Thin Liquid Thin Liquid: Impaired Pharyngeal  Phase Impairments: Throat Clearing - Delayed    Nectar Thick Nectar Thick Liquid: Not tested   Honey Thick Honey Thick Liquid: Not tested   Puree Puree: Within functional limits Presentation: Spoon   Solid     Solid: Within functional limits Presentation: [place], [place], [place] Office: [phone] 08/06/2022,10:05 AM

## 2022-08-07 LAB — BASIC METABOLIC PANEL
Anion gap: 11 (ref 5–15)
BUN: 27 mg/dL — ABNORMAL HIGH (ref 8–23)
CO2: 29 mmol/L (ref 22–32)
Calcium: 10.2 mg/dL (ref 8.9–10.3)
Chloride: 100 mmol/L (ref 98–111)
Creatinine, Ser: 1.88 mg/dL — ABNORMAL HIGH (ref 0.44–1.00)
GFR, Estimated: 26 mL/min — ABNORMAL LOW (ref >60)
Glucose, Bld: 101 mg/dL — ABNORMAL HIGH (ref 70–99)
Potassium: 3.8 mmol/L (ref 3.5–5.1)
Sodium: 140 mmol/L (ref 135–145)

## 2022-08-07 LAB — CBC
HCT: 35.5 % — ABNORMAL LOW (ref 36.0–46.0)
Hemoglobin: 11.9 g/dL — ABNORMAL LOW (ref 12.0–15.0)
MCH: 28.7 pg (ref 26.0–34.0)
MCHC: 33.5 g/dL (ref 30.0–36.0)
MCV: 85.5 fL (ref 80.0–100.0)
Platelets: 197 10*3/uL (ref 150–400)
RBC: 4.15 MIL/uL (ref 3.87–5.11)
RDW: 14.2 % (ref 11.5–15.5)
WBC: 5.8 10*3/uL (ref 4.0–10.5)
nRBC: 0 % (ref 0.0–0.2)

## 2022-08-07 MED ORDER — POTASSIUM CHLORIDE CRYS ER 20 MEQ PO TBCR
40.0000 meq | EXTENDED_RELEASE_TABLET | Freq: Two times a day (BID) | ORAL | Status: AC
  Administered 2022-08-07 (×2): 40 meq via ORAL
  Filled 2022-08-07 (×2): qty 2

## 2022-08-07 MED ORDER — HYDRALAZINE HCL 50 MG PO TABS
50.0000 mg | ORAL_TABLET | Freq: Three times a day (TID) | ORAL | Status: DC
  Administered 2022-08-07 – 2022-08-08 (×3): 50 mg via ORAL
  Filled 2022-08-07 (×3): qty 1

## 2022-08-07 NOTE — Progress Notes (Signed)
Heart Failure Stewardship Pharmacist Progress Note   PCP: Laurey Morale, MD PCP-Cardiologist: Shelva Majestic, MD    HPI:  81 yo F with PMH of afib, HFpEF, GERD, CAD s/p PCI, HTN, HLD, CKD IIIb, tachy-brady syndrome s/p PPM, and OSA on CPAP.  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 ECHO 05/2022 with LVEF >75%, no regional wall motion abnormalities, elevated LA pressures, RV normal, elevated PA pressures, moderate MR, and RA pressure of 3 mmHg. LHC at that time with mild to moderate non-obstructive CAD.   Presented to the ED on 10/1 with shortness of breath and LE edema. BNP elevated 1908.9. CXR with central venous congestion and peribronchial thickening without focal consolidation. Admitted for acute on chronic diastolic CHF and being treated for bronchitis.  Current HF Medications: Diuretic: furosemide 20 mg IV BID Beta Blocker: metoprolol XL 25 mg daily SGLT2i: Jardiance 10 mg daily Other: hydralazine 25 mg TID  Prior to admission HF Medications: Diuretic: furosemide 20 mg daily Beta blocker: metoprolol XL 50 mg daily Other: hydralazine 50 mg BID; Imdur 30 mg daily  Pertinent Lab Values: Serum creatinine 1.88, BUN 27, Potassium 3.8, Sodium 140, BNP 1908.9, Magnesium 1.9  Vital Signs: Weight: 176 lbs (admission weight: 176 lbs) Blood pressure: 140-170/60s  Heart rate: 60s  I/O: incomplete  Medication Assistance / Insurance Benefits Check: Does the patient have prescription insurance?  Yes Type of insurance plan: UHC Medicare  Does the patient qualify for medication assistance through manufacturers or grants?   Yes Eligible grants and/or patient assistance programs: Jardiance Medication assistance applications in progress: Jardiance  Medication assistance applications approved: none Approved medication assistance renewals will be completed by: pending  Outpatient Pharmacy:  Prior to admission outpatient pharmacy: [facility] Is the patient willing to  use [facility] pharmacy at discharge? Yes Is the patient willing to transition their outpatient pharmacy to utilize a [facility] outpatient pharmacy?   Pending     Assessment: 1. Acute on chronic diastolic CHF (LVEF >35%), due to NICM. NYHA class III symptoms. - May be approaching euvolemia, SCr bump further today. Consider transitioning to PO lasix. Strict I/Os and daily weights. Goal K>4 and Mag>2. Consider replacing with KCl 40 mEq x1 and Mag 2g IV x 1 - Continue metoprolol XL 25 mg daily - Continue Jardiance 10 mg daily, patient assistance started - Consider increasing to hydralazine 50 mg TID with elevated SBP   Plan: 1) Medication changes recommended at this time: - KCl 40 mEq x 1 - Magnesium 2g IV x 1 - Increase hydralazine to 50 mg TID  2) Patient assistance: - Currently in the donut hole - Jardiance patient assistance initiated  3)  Education  - Patient has been educated on current HF medications and potential additions to HF medication regimen - Patient verbalizes understanding that over the next few months, these medication doses may change and more medications may be added to optimize HF regimen - Patient has been educated on basic disease state pathophysiology and goals of therapy   Kerby Nora, PharmD, BCPS Heart Failure Cytogeneticist Phone [phone]

## 2022-08-07 NOTE — Progress Notes (Signed)
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  History of cough, mild swelling, low-grade fever as well. -In the emergency room she was hypoxic 85 to 89% on room air, labs noted creatinine of 1.5, troponin 56, flu and COVID PCR were negative, BNP was 11/12/2006, chest x-ray noted central vascular congestion and peribronchial thickening -Started on diuretics, then developed fever of 102 with productive cough  Subjective: -Feels better overall, breathing is improving, cough is better  Assessment and Plan:  Acute on chronic diastolic CHF Acute hypoxemic respiratory failure -Echo done in July 2023 showing EF >75%, moderate MR. -Patient reports missing Lasix for the last few days -Diuresed with IV Lasix she is 3.7 L negative, creatinine bumped to 1.8, hold further IV Lasix today -Continue Jardiance -Weaned off O2, increase activity -Discharge planning   Fever, bronchitis Chest x-ray showing peribronchial thickening without focal consolidation -Temp of 102 on 10/2, COVID and flu PCR negative -Continue cefdinir day 3/5 and DuoNebs   Paroxysmal A-fib Has a pacemaker. -Continue metoprolol, amiodarone, and Eliquis -Decreased metoprolol dose   Tachy-brady syndrome status post PPM 05/2022 -Continue amiodarone   CAD status post PCI Troponin elevation likely due to demand ischemia in setting of decompensated CHF.  Troponins mildly elevated and flat, not consistent with ACS.  Patient is not endorsing chest pain.  Cath done in July 2023 showing mild to moderate nonobstructive CAD and patent proximal/mid LAD stent with up to 30% in-stent restenosis. -Continue aspirin,  metoprolol, Imdur, and Crestor    Hypertension -Trending higher will increase hydralazine dose   Hyperlipidemia -Continue Crestor    CKD stage IIIb Creatinine trending up today, hold further diuretics -BM P in a.m.   OSA -Continue nightly CPAP   DVT prophylaxis: Eliquis Code Status: Full Code (discussed with the patient) Family Communication: Called and updated patient's son Disposition:, Home tomorrow  Consultants:    Procedures:   Antimicrobials:    Objective: Vitals:   08/06/22 1049 08/06/22 1504 08/06/22 2044 08/07/22 0857  BP:  (!) 154/55 (!) 136/54 (!) 172/62  Pulse:  62 63 60  Resp:  '16 18 20  '$ Temp:  99.2 F (37.3 C) 98 F (36.7 C) 98.1 F (36.7 C)  TempSrc:  Oral Oral Oral  SpO2: 97% 96% 94% 96%  Weight:      Height:        Intake/Output Summary (Last 24 hours) at 08/07/2022 1153 Last data filed at 08/06/2022 2045 Gross per 24 hour  Intake --  Output 850 ml  Net -850 ml   Filed Weights   08/04/22 1344 08/04/22 2105  Weight: 78.9 kg 79.9 kg    Examination:  General exam: Pleasant elderly female sitting up in bed, AAOx3, no distress HEENT: Positive JVD CVS: S1-S2, regular rhythm Lungs: Few scattered rhonchi and basilar rales Abdomen: Soft, nontender, bowel sounds present Extremities: No edema Skin: No rashes Psychiatry:  Mood & affect appropriate.     Data Reviewed:   CBC: Recent Labs  Lab 08/04/22 1348 08/06/22 0153 08/07/22 0210  WBC 9.6 8.6 5.8  HGB 10.3* 10.9* 11.9*  HCT 32.5* 32.5* 35.5*  MCV 86.4 85.5 85.5  PLT 160 162 287   Basic Metabolic  Panel: Recent Labs  Lab 08/04/22 1348 08/05/22 0326 08/06/22 0153 08/07/22 0210  NA 140 140 139 140  K 3.6 3.2* 3.7 3.8  CL 105 103 105 100  CO2 '24 24 26 29  '$ GLUCOSE 126* 105* 107* 101*  BUN '19 16 21 '$ 27*  CREATININE 1.51* 1.64* 1.69* 1.88*  CALCIUM 10.0 10.0 9.5 10.2  MG  --   --  1.9  --    GFR: Estimated Creatinine Clearance: 24.7 mL/min (A) (by C-G formula based on  SCr of 1.88 mg/dL (H)). Liver Function Tests: Recent Labs  Lab 08/04/22 1632  AST 31  ALT 23  ALKPHOS 74  BILITOT 1.2  PROT 7.3  ALBUMIN 4.0   No results for input(s): "LIPASE", "AMYLASE" in the last 168 hours. No results for input(s): "AMMONIA" in the last 168 hours. Coagulation Profile: No results for input(s): "INR", "PROTIME" in the last 168 hours. Cardiac Enzymes: No results for input(s): "CKTOTAL", "CKMB", "CKMBINDEX", "TROPONINI" in the last 168 hours. BNP (last 3 results) No results for input(s): "PROBNP" in the last 8760 hours. HbA1C: No results for input(s): "HGBA1C" in the last 72 hours. CBG: No results for input(s): "GLUCAP" in the last 168 hours. Lipid Profile: No results for input(s): "CHOL", "HDL", "LDLCALC", "TRIG", "CHOLHDL", "LDLDIRECT" in the last 72 hours. Thyroid Function Tests: No results for input(s): "TSH", "T4TOTAL", "FREET4", "T3FREE", "THYROIDAB" in the last 72 hours. Anemia Panel: No results for input(s): "VITAMINB12", "FOLATE", "FERRITIN", "TIBC", "IRON", "RETICCTPCT" in the last 72 hours. Urine analysis:    Component Value Date/Time   COLORURINE YELLOW 06/09/2022 1426   APPEARANCEUR CLOUDY (A) 06/09/2022 1426   LABSPEC 1.010 06/09/2022 1426   PHURINE 6.5 06/09/2022 1426   GLUCOSEU NEGATIVE 06/09/2022 1426   GLUCOSEU NEGATIVE 09/13/2021 1203   HGBUR LARGE (A) 06/09/2022 1426   HGBUR negative 10/17/2010 0859   BILIRUBINUR NEGATIVE 06/09/2022 1426   BILIRUBINUR neg 08/01/2021 1037   KETONESUR NEGATIVE 06/09/2022 1426   PROTEINUR NEGATIVE 06/09/2022 1426   UROBILINOGEN 0.2 09/13/2021 1203   NITRITE NEGATIVE 06/09/2022 1426   LEUKOCYTESUR TRACE (A) 06/09/2022 1426   Sepsis Labs: '@LABRCNTIP'$ (procalcitonin:4,lacticidven:4)  ) Recent Results (from the past 240 hour(s))  Resp Panel by RT-PCR (Flu A&B, Covid) Anterior Nasal Swab     Status: None   Collection Time: 08/04/22  1:50 PM   Specimen: Anterior Nasal Swab  Result Value Ref Range  Status   SARS Coronavirus 2 by RT PCR NEGATIVE NEGATIVE Final    Comment: (NOTE) SARS-CoV-2 target nucleic acids are NOT DETECTED.  The SARS-CoV-2 RNA is generally detectable in upper respiratory specimens during the acute phase of infection. The lowest concentration of SARS-CoV-2 viral copies this assay can detect is 138 copies/mL. A negative result does not preclude SARS-Cov-2 infection and should not be used as the sole basis for treatment or other patient management decisions. A negative result may occur with  improper specimen collection/handling, submission of specimen other than nasopharyngeal swab, presence of viral mutation(s) within the areas targeted by this assay, and inadequate number of viral copies(<138 copies/mL). A negative result must be combined with clinical observations, patient history, and epidemiological information. The expected result is Negative.  Fact Sheet for Patients:  EntrepreneurPulse.com.au  Fact Sheet for Healthcare Providers:  IncredibleEmployment.be  This test is no t yet approved or cleared by the [place] FDA and  has been authorized for detection and/or diagnosis of SARS-CoV-2 by FDA under an Emergency Use Authorization (EUA). This EUA will remain  in effect (meaning  this test can be used) for the duration of the COVID-19 declaration under Section 564(b)(1) of the Act, 21 U.S.C.section 360bbb-3(b)(1), unless the authorization is terminated  or revoked sooner.       Influenza A by PCR NEGATIVE NEGATIVE Final   Influenza B by PCR NEGATIVE NEGATIVE Final    Comment: (NOTE) The Xpert Xpress SARS-CoV-2/FLU/RSV plus assay is intended as an aid in the diagnosis of influenza from Nasopharyngeal swab specimens and should not be used as a sole basis for treatment. Nasal washings and aspirates are unacceptable for Xpert Xpress SARS-CoV-2/FLU/RSV testing.  Fact Sheet for  Patients: EntrepreneurPulse.com.au  Fact Sheet for Healthcare Providers: IncredibleEmployment.be  This test is not yet approved or cleared by the [place] FDA and has been authorized for detection and/or diagnosis of SARS-CoV-2 by FDA under an Emergency Use Authorization (EUA). This EUA will remain in effect (meaning this test can be used) for the duration of the COVID-19 declaration under Section 564(b)(1) of the Act, 21 U.S.C. section 360bbb-3(b)(1), unless the authorization is terminated or revoked.  Performed at [facility], [address] [place], [place] [address]      Radiology Studies: DG Swallowing [facility] Pathology  Result Date: 08/07/2022 Table formatting from the original result was not included. Objective Swallowing Evaluation: Type of Study: MBS-Modified Barium Swallow Study  Patient Details Name: Barbara [name] MRN: [id number] Date of Birth: 1941-05-20 Today's Date: 08/07/2022 Time: SLP Start Time (ACUTE ONLY): 1242 -SLP Stop Time (ACUTE ONLY): 4097 SLP Time Calculation (min) (ACUTE ONLY): 12 min Past Medical History: Past Medical History: Diagnosis Date  A-fib ([place])   Allergy   CAD (coronary artery disease)   sees Dr. Shelva Majestic  cardiac stents - 2000  CHF (congestive heart failure) ([place])   Colon polyps   Complication of anesthesia   rash/hives with "caines"  Dyspnea   02/12/18 " when my heart gets out of rhythm, it has not been out of rhythym- since I have been on Tikosyn (11/2017)  Dysrhythmia   afib fib  GERD (gastroesophageal reflux disease)   takes OTC- Omeprazole- prn  Heart murmur   History of kidney stones   History of stress test   show normal perfusion without scar or ischemia, post EF 68%  Hx of echocardiogram   show an EF 55%-60% range with grade 1 diastolic dysfunction, she had mitral anular calcification with mild MR, moderate LA dilation and mild pulmonary hypertension with a PA estimated  pressure of 51m  Hyperlipidemia   Hypertension   NSTEMI (non-ST elevated myocardial infarction) [place]   Osteoarthritis   Pacemaker   Pneumonia   hx of 2015   PONV (postoperative nausea and vomiting)   Sleep apnea  Past Surgical History: Past Surgical History: Procedure Laterality Date  CARDIAC CATHETERIZATION    11/2017  cardiac stents  2000  COLONOSCOPY  01-05-14  per Dr. JTeena Irani clear, no repeats needed   COLONOSCOPY    CORONARY ST[place]RETEROSCOPY AND [place]ENT PLACEMENT Left 06/11/2022  Procedure: CYSTOSCOPY WITH RETROGRADE PYELOGRAM, LEFT STENT PLACEMENT;  Surgeon: BLucas Mallow MD;  Location: [facility];  Service: Urology;  Laterality: Left;  CYSTOSCOPY/URETEROSCOPY/HOLMIUM LASER/STENT PLACEMENT Left 06/25/2022  Procedure: CYSTOSCOPY LEFT URETEROSCOPY/HOLMIUM LASER/STENT PLACEMENT;  Surgeon: WIrine Seal MD;  Location: [facility];  Service: Urology;  Laterality: Left;  1 HR FOR THIS CASE  DIRECT LARYNGOSCOPY WITH RADIAESSE INJECTION N/A 02/13/2018  Procedure: DIRECT LARYNGOSCOPY WITH RADIAESSE INJECTION;  Surgeon: Melida Quitter, MD;  Location: [facility];  Service: ENT;  Laterality: N/A;  EYE SURGERY Left   CATARACT REMOVAL  KNEE ARTHROSCOPY Left 01/06/2015  Procedure: LEFT KNEE ARTHROSCOPY, abrasion chondroplasty of the medial femerol condryl,medial and lateral menisectomy, microfracture , synovectomy of the suprpatellar pouch;  Surgeon: Latanya Maudlin, MD;  Location: [facility];  Service: Orthopedics;  Laterality: Left;  LEFT HEART CATH AND CORONARY ANGIOGRAPHY N/A 02/26/2019  Procedure: LEFT HEART CATH AND CORONARY ANGIOGRAPHY;  Surgeon: Troy Sine, MD;  Location: [facility];  Service: Cardiovascular;  Laterality: N/A;  LEFT HEART CATH AND CORONARY ANGIOGRAPHY N/A 05/06/2022  Procedure: LEFT HEART CATH AND CORONARY ANGIOGRAPHY;  Surgeon: Nelva Bush, MD;  Location: [facility];  Service: Cardiovascular;  Laterality: N/A;  MICROLARYNGOSCOPY  W/VOCAL CORD INJECTION N/A 08/07/2018  Procedure: MICROLARYNGOSCOPY WITH VOCAL CORD INJECTION OF PROLARYN;  Surgeon: Melida Quitter, MD;  Location: [facility];  Service: ENT;  Laterality: N/A;  JET VENTILATION  PACEMAKER IMPLANT N/A 05/08/2022  Procedure: PACEMAKER IMPLANT;  Surgeon: Constance Haw, MD;  Location: [place] [facility];  Service: Cardiovascular;  Laterality: N/A;  REVERSE SHOULDER ARTHROPLASTY Right 03/23/2020  Procedure: REVERSE SHOULDER ARTHROPLASTY;  Surgeon: Nicholes Stairs, MD;  Location: [place];  Service: Orthopedics;  Laterality: Right;  VAGINAL HYSTERECTOMY  1971 HPI: Barbara [name] is a 81 y.o. female who presented to the ED with shortness of breath.  Hypertensive on arrival with blood pressure 177/59.  Oxygen saturation 85-89% on room air, improved with 2 L supplemental oxygen.  Chest x-ray showing central venous congestion and peribronchial thickening without focal consolidation. 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  No data recorded  Recommendations for follow up therapy are one component of a multi-disciplinary discharge planning process, led by the attending physician.  Recommendations may be updated based on patient status, additional functional criteria and insurance authorization. Assessment / Plan / Recommendation   08/06/2022   1:00 PM Clinical Impressions Clinical Impression Pt presents with grossly normal pharyngeal swallow function.  There was no penetration or aspiration of any consistencies trialed, including large straw sips of thin liquid.  There was no pharyngeal stasis of any consistencies.  Tablet transited through esophagus during pill simulation without any stasis on esophageal sweep.  Possible cervical osteophytes observed (confirmation needed by radiology) without impeding bolus  flow.  Recommend continuing regular texture diet with thin liquids.  Pt has no further ST needs.  SLP will sign off at this time. SLP Visit Diagnosis Dysphagia, unspecified (R13.10) Impact on safety and function No limitations     08/06/2022   1:00 PM Treatment Recommendations Treatment Recommendations No treatment recommended at this time     08/06/2022   1:03 PM Prognosis Prognosis for Safe Diet Advancement --   08/06/2022   1:00 PM Diet Recommendations SLP Diet Recommendations Regular solids;Thin liquid Liquid Administration via Cup;Straw Medication Administration Whole meds with liquid Compensations Slow rate;Small sips/bites Postural Changes Seated upright at 90 degrees     08/06/2022   1:00 PM Other Recommendations Oral Care Recommendations Oral care BID Follow Up Recommendations No SLP follow up Assistance recommended at discharge None Functional Status Assessment Patient has not had a recent decline in their functional status   08/06/2022   1:00 PM Frequency and Duration  Speech Therapy Frequency (ACUTE ONLY) --  08/06/2022  12:59 PM Oral Phase Oral Phase WFL Oral - Thin Straw WFL Oral - Regular WFL Oral - Pill [facility]    08/06/2022  12:59 PM Pharyngeal Phase Pharyngeal Phase WFL Pharyngeal- Thin Straw WFL Pharyngeal Material does not enter airway Pharyngeal- Regular WFL Pharyngeal Material does not enter airway Pharyngeal- Pill WFL Pharyngeal Material does not enter airway    08/06/2022   1:00 PM Cervical Esophageal Phase  Cervical Esophageal Phase WFL Celedonio Savage, MA, CCC-SLP Acute Rehabilitation Services Office: [phone] 08/07/2022, 8:54 AM                       Scheduled Meds:  amiodarone  200 mg Oral Daily   apixaban  2.5 mg Oral BID   cefdinir  300 mg Oral Daily   empagliflozin  10 mg Oral Daily   hydrALAZINE  25 mg Oral TID   metoprolol succinate  25 mg Oral Daily   potassium chloride  40 mEq Oral BID   rosuvastatin  40 mg Oral Daily   Continuous Infusions:   LOS: 3 days    Time spent:  40mn  PDomenic Polite MD [facility]   08/07/2022, 11:53 AM

## 2022-08-07 NOTE — Progress Notes (Signed)
Pt refused CPAP. RT will cont to monitor as needed.

## 2022-08-07 NOTE — Evaluation (Signed)
Occupational Therapy Evaluation and Discharge Patient Details Name: Barbara [name] MRN: [id number] DOB: [birth year] Today's Date: 08/07/2022   History of Present Illness Pt is an 81 y/o F admitted on 08/04/22 after presenting to the ED with c/o SOB. Chest x-ray noted central vascular congestion & peribronchial thickening. Pt is being treated for acute on chronic diastolic CHF, acute hypoxemic respiratory failure, fever & bronchitis. PMH: paroxysmal a-fib on eliquis, chronic diastolic CHF, GERD, CAD s/p PCI, HTN, HLD, CKD 3B, tachy-brady syndrome s/p PPM 05/2022, OSA on CPAP   Clinical Impression   This 81 yo female admitted with above presents to acute OT at a setup-minguard A level with her basic ADLs in this environment. The more she was up on her feet the steadier she got and she is very cautious when up and moving stating she knows she is not as strong as her usual. She has all needed DME, no further OT needs, we will sign off.      Recommendations for follow up therapy are one component of a multi-disciplinary discharge planning process, led by the attending physician.  Recommendations may be updated based on patient status, additional functional criteria and insurance authorization.   Follow Up Recommendations  No OT follow up    Assistance Recommended at Discharge PRN  Patient can return home with the following Assist for transportation    Functional Status Assessment  Patient has had a recent decline in their functional status and demonstrates the ability to make significant improvements in function in a reasonable and predictable amount of time. (but getting closer to her baseline, so no further skilled OT needs.)  Equipment Recommendations  None recommended by OT       Precautions / Restrictions Precautions Precautions: Fall Restrictions Weight Bearing Restrictions: No      Mobility Bed Mobility Overal bed mobility: Independent                   Transfers Overall transfer level: Needs assistance   Transfers: Sit to/from Stand Sit to Stand: Modified independent (Device/Increase time)           General transfer comment: increased time; min guard A for ambulation in room without AD      Balance Overall balance assessment: Needs assistance Sitting-balance support: No upper extremity supported, Feet supported Sitting balance-Leahy Scale: Good     Standing balance support: No upper extremity supported, During functional activity Standing balance-Leahy Scale: Good Standing balance comment: standing at sink for grooming                           ADL either performed or assessed with clinical judgement   ADL Overall ADL's : Needs assistance/impaired Eating/Feeding: Independent;Sitting   Grooming: Supervision/safety;Standing;Wash/dry face;Oral care   Upper Body Bathing: Set up;Sitting   Lower Body Bathing: Set up;Sit to/from stand   Upper Body Dressing : Set up;Sitting   Lower Body Dressing: Set up;Sit to/from stand   Toilet Transfer: Min guard;Ambulation;BSC/3in1 Toilet Transfer Details (indicate cue type and reason): ambulating to door then back to 3n1 on other side of room Toileting- Clothing Manipulation and Hygiene: Modified independent;Sit to/from stand Toileting - Clothing Manipulation Details (indicate cue type and reason): increased time       General ADL Comments: All activities increased time due to patient reports although she is getting stronger she is weaker than her normal     Vision Baseline Vision/History: 1 Wears glasses Ability to See in Adequate  Light: 0 Adequate Patient Visual Report: No change from baseline              Pertinent Vitals/Pain Pain Assessment Pain Assessment: No/denies pain     Hand Dominance Right   Extremity/Trunk Assessment Upper Extremity Assessment Upper Extremity Assessment: Overall WFL for tasks assessed           Communication  Communication Communication: No difficulties   Cognition Arousal/Alertness: Awake/alert Behavior During Therapy: WFL for tasks assessed/performed Overall Cognitive Status: Within Functional Limits for tasks assessed                                                  Home Living Family/patient expects to be discharged to:: Private residence Living Arrangements: Alone Available Help at Discharge: Family;Friend(s);Available PRN/intermittently Type of Home: House Home Access: Stairs to enter [employer] of Steps: 2 Entrance Stairs-Rails: Right Home Layout: One level     Bathroom Shower/Tub: Occupational psychologist: Handicapped height     Home Equipment: [place] - single Barista (2 wheels);Shower seat;BSC/3in1;Rollator (4 wheels);Other (comment) (lift chair)   Additional Comments: pt reports son lives in [place]      Prior Functioning/Environment Prior Level of Function : Independent/Modified Independent;Driving             Mobility Comments: Pt reports she's ambulatory with SPC PRN (sometimes forgets it in the house), denies falls. ADLs Comments: Reports she is independent with cooking & driving.        OT Problem List: Impaired balance (sitting and/or standing)         OT Goals(Current goals can be found in the care plan section) Acute Rehab OT Goals Patient Stated Goal: To get back to the way I was before         AM-PAC OT "6 Clicks" Daily Activity     Outcome Measure Help from another person eating meals?: None Help from another person taking care of personal grooming?: A Little Help from another person toileting, which includes using toliet, bedpan, or urinal?: A Little Help from another person bathing (including washing, rinsing, drying)?: A Little Help from another person to put on and taking off regular upper body clothing?: A Little Help from another person to put on and taking off regular lower body  clothing?: A Little 6 Click Score: 19   End of Session Nurse Communication:  (called front asking for new purewick)  Activity Tolerance: Patient tolerated treatment well Patient left: in bed;with call bell/phone within reach  OT Visit Diagnosis: Unsteadiness on feet (R26.81) (but very cautious)                Time: 4401-0272 OT Time Calculation (min): 26 min Charges:  OT General Charges $OT Visit: 1 Visit OT Evaluation $OT Eval Moderate Complexity: 1 Mod OT Treatments $Self Care/Home Management : 8-22 mins  [address] [address], OTR/L Acute Rehab Services [facility] [phone] Office [phone]    Almon Register 08/07/2022, 9:05 AM

## 2022-08-08 ENCOUNTER — Other Ambulatory Visit (HOSPITAL_COMMUNITY): Payer: Self-pay

## 2022-08-08 ENCOUNTER — Telehealth (HOSPITAL_BASED_OUTPATIENT_CLINIC_OR_DEPARTMENT_OTHER): Payer: Self-pay | Admitting: Emergency Medicine

## 2022-08-08 ENCOUNTER — Encounter: Payer: Medicare Other | Admitting: Internal Medicine

## 2022-08-08 LAB — BASIC METABOLIC PANEL
Anion gap: 9 (ref 5–15)
BUN: 37 mg/dL — ABNORMAL HIGH (ref 8–23)
CO2: 25 mmol/L (ref 22–32)
Calcium: 9.8 mg/dL (ref 8.9–10.3)
Chloride: 104 mmol/L (ref 98–111)
Creatinine, Ser: 2.02 mg/dL — ABNORMAL HIGH (ref 0.44–1.00)
GFR, Estimated: 24 mL/min — ABNORMAL LOW (ref >60)
Glucose, Bld: 99 mg/dL (ref 70–99)
Potassium: 4.4 mmol/L (ref 3.5–5.1)
Sodium: 138 mmol/L (ref 135–145)

## 2022-08-08 MED ORDER — CEFDINIR 300 MG PO CAPS
300.0000 mg | ORAL_CAPSULE | Freq: Every day | ORAL | 0 refills | Status: AC
  Filled 2022-08-08: qty 2, 2d supply, fill #0

## 2022-08-08 MED ORDER — FUROSEMIDE 20 MG PO TABS
20.0000 mg | ORAL_TABLET | Freq: Every day | ORAL | 0 refills | Status: DC
  Filled 2022-08-08: qty 30, 30d supply, fill #0

## 2022-08-08 MED ORDER — EMPAGLIFLOZIN 10 MG PO TABS
10.0000 mg | ORAL_TABLET | Freq: Every day | ORAL | 0 refills | Status: DC
  Filled 2022-08-08: qty 30, 30d supply, fill #0

## 2022-08-08 NOTE — Progress Notes (Signed)
Heart Failure Stewardship Pharmacist Progress Note   PCP: Laurey Morale, MD PCP-Cardiologist: Shelva Majestic, MD    HPI:  81 yo F with PMH of afib, HFpEF, GERD, CAD s/p PCI, HTN, HLD, CKD IIIb, tachy-brady syndrome s/p PPM, and OSA on CPAP.  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 ECHO 05/2022 with LVEF >75%, no regional wall motion abnormalities, elevated LA pressures, RV normal, elevated PA pressures, moderate MR, and RA pressure of 3 mmHg. LHC at that time with mild to moderate non-obstructive CAD.   Presented to the ED on 10/1 with shortness of breath and LE edema. BNP elevated 1908.9. CXR with central venous congestion and peribronchial thickening without focal consolidation. Admitted for acute on chronic diastolic CHF and being treated for bronchitis.  Current HF Medications: Beta Blocker: metoprolol XL 25 mg daily SGLT2i: Jardiance 10 mg daily Other: hydralazine 50 mg TID  Prior to admission HF Medications: Diuretic: furosemide 20 mg daily Beta blocker: metoprolol XL 50 mg daily Other: hydralazine 50 mg BID; Imdur 30 mg daily  Pertinent Lab Values: Serum creatinine 2.02, BUN 37, Potassium 4.4, Sodium 138, BNP 1908.9, Magnesium 1.9  Vital Signs: Weight: 176 lbs (admission weight: 176 lbs) Blood pressure: 130-170/60s  Heart rate: 60s  I/O: incomplete  Medication Assistance / Insurance Benefits Check: Does the patient have prescription insurance?  Yes Type of insurance plan: UHC Medicare  Does the patient qualify for medication assistance through manufacturers or grants?   Yes Eligible grants and/or patient assistance programs: Jardiance Medication assistance applications in progress: Jardiance  Medication assistance applications approved: none Approved medication assistance renewals will be completed by: pending  Outpatient Pharmacy:  Prior to admission outpatient pharmacy: [facility] Is the patient willing to use [facility] at discharge?  Yes Is the patient willing to transition their outpatient pharmacy to utilize a [facility] outpatient pharmacy?   Pending     Assessment: 1. Acute on chronic diastolic CHF (LVEF >03%), due to NICM. NYHA class II symptoms. - Off diuretics. SCr bump further today, monitor. Conisder restarting PO lasix once SCr stable/improved. Strict I/Os and daily weights. Goal K>4 and Mag>2 - Continue metoprolol XL 25 mg daily - Continue Jardiance 10 mg daily, patient assistance started - Continue hydralazine 50 mg TID   Plan: 1) Medication changes recommended at this time: - Resume furosemide 20 mg PO daily tomorrow if SCr stable/improved  2) Patient assistance: - Currently in the donut hole - Jardiance patient assistance initiated  3)  Education  - Patient has been educated on current HF medications and potential additions to HF medication regimen - Patient verbalizes understanding that over the next few months, these medication doses may change and more medications may be added to optimize HF regimen - Patient has been educated on basic disease state pathophysiology and goals of therapy   Kerby Nora, PharmD, BCPS Heart Failure Stewardship Pharmacist Phone [phone]

## 2022-08-08 NOTE — TOC Transition Note (Signed)
Transition of Care [facility]) - CM/SW Discharge Note   Patient Details  Name: Barbara [name] MRN: [id number] Date of Birth: 12/22/1940  [facility] of [facility] CM/SW Contact:  Bethena Roys, RN Phone Number: 08/08/2022, 10:25 AM   Clinical Narrative: Patient will transition home today. Case Manager spoke with patient regarding home health physical therapy and nurse for medication and disease management. Patient is agreeable to home health services. Medicare.gov list provided and patient chose [facility]. Referral submitted to [facility] and the office will service the patient. Start of care to begin within 24-48 hours post transition home. No further needs identified at this time.     Final next level of care: [place] Barriers to Discharge: No Barriers Identified   Patient Goals and CMS Choice Patient states their goals for this hospitalization and ongoing recovery are:: to return home CMS Medicare.gov Compare Post Acute Care list provided to:: Patient Choice offered to / list presented to : Patient   Discharge Plan and Services In-house Referral: NA Discharge Planning Services: CM Consult Post Acute Care Choice: Home Health          DME Arranged: N/A DME Agency: NA       HH Arranged: PT [place] Agency: [place] Date [facility] Agency Contacted: 08/08/22 Time [place] Agency Contacted: 49 Representative spoke with at [place]: Tommi Rumps  Social Determinants of Health (SDOH) Interventions Food Insecurity Interventions: Intervention Not Indicated Housing Interventions: Intervention Not Indicated Transportation Interventions: Intervention Not Indicated Utilities Interventions: Intervention Not Indicated Alcohol Usage Interventions: Intervention Not Indicated (Score <7) Financial Strain Interventions: Intervention Not Indicated   Readmission Risk Interventions     No data to display

## 2022-08-08 NOTE — Care Management Important Message (Signed)
Important Message  Patient Details  Name: Barbara [name] MRN: [id number] Date of Birth: 10/23/1941   Medicare Important Message Given:  Yes     Shelda Altes 08/08/2022, 11:23 AM

## 2022-08-08 NOTE — Discharge Summary (Signed)
Physician Discharge Summary  ABUK SELLECK OZD:[id number] DOB: 1941-08-17 DOA: 08/04/2022  PCP: Laurey Morale, MD  Admit date: 08/04/2022 Discharge date: 08/08/2022  Time spent: 29mnutes  Recommendations for Outpatient Follow-up:  CHF TOC clinic in 8 days PCP Dr. FSarajane Jewsin 1 to 2 weeks  Discharge Diagnoses:  Acute hypoxic respiratory failure   Acute on chronic diastolic (congestive) heart failure ([place] AKI on CKD 3b Bronchitis, fever   CAD (coronary artery disease/elevated troponin   CKD (chronic kidney disease), stage III ([facility]   Atrial fibrillation ([place]   OSA (obstructive sleep apnea)   Acute hypoxemic respiratory failure ([place]   Discharge Condition: Improved  Diet recommendation: Low-sodium, heart healthy  Filed Weights   08/04/22 1344 08/04/22 2105  Weight: 78.9 kg 79.9 kg    History of present illness:  GAMYRIE ILLINGWORTHis a 81/F w/ h/o paroxysmal A-fib on Eliquis, chronic diastolic CHF, GERD, CAD status post PCI, hypertension, hyperlipidemia, CKD stage IIIb, 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  History of cough, mild swelling, low-grade fever as well. -In the emergency room she was hypoxic 85 to 89% on room air, labs noted creatinine of 1.5, troponin 56, flu and COVID PCR were negative, BNP was 11/12/2006, chest x-ray noted central vascular congestion and peribronchial thickening -Started on diuretics, then developed fever of 102 with productive cough  Hospital Course:   Acute on chronic diastolic CHF Acute hypoxemic respiratory failure -Echo done in July 2023 showing EF >75%, moderate MR. -Patient reports missing Lasix for the last few days -Diuresed with IV Lasix she is 4.4 L negative, mild bump in creatinine, still close to her baseline, advised to hold Lasix for 2 days after discharge and then resume -Continue Jardiance -Weaned off O2, ambulating with PT, discharged home in a stable  condition, close follow-up with PCP in 1 week -Compliance with CPAP emphasized  AKI on CKD 3B -Baseline creatinine around 1.6-2, creatinine has trended up from 1.7-2 today, will hold Lasix for 2 days at discharge   Fever, bronchitis Chest x-ray showing peribronchial thickening without focal consolidation -Temp of 102 on 10/2, COVID and flu PCR negative -Continue cefdinir day 3/5 and DuoNebs   Paroxysmal A-fib Has a pacemaker. -Continue metoprolol, amiodarone, and Eliquis -Decreased metoprolol dose   Tachy-brady syndrome status post PPM 05/2022 -Continue amiodarone   CAD status post PCI Troponin elevation likely due to demand ischemia in setting of decompensated CHF.  Troponins mildly elevated and flat, not consistent with ACS.  Patient is not endorsing chest pain.  Cath done in July 2023 showing mild to moderate nonobstructive CAD and patent proximal/mid LAD stent with up to 30% in-stent restenosis. -Continue aspirin, metoprolol, Imdur, and Crestor    Hypertension -Trending higher will increase hydralazine dose   Hyperlipidemia -Continue Crestor    OSA -Continue nightly CPAP  Discharge Exam: Vitals:   08/07/22 2146 08/08/22 0507  BP: (!) 130/57 (!) 171/65  Pulse: 63 60  Resp: 16 16  Temp: 97.8 F (36.6 C) 98.4 F (36.9 C)  SpO2: 97% 97%   General exam: Pleasant elderly female sitting up in bed, AAOx3, no distress HEENT: Positive JVD CVS: S1-S2, regular rhythm Lungs: Few scattered rhonchi and basilar rales Abdomen: Soft, nontender, bowel sounds present Extremities: No edema Skin: No rashes Psychiatry:  Mood & affect appropriate.   Discharge Instructions   Discharge Instructions     Diet - low sodium heart healthy   Complete by:  As directed    Increase activity slowly   Complete by: As directed       Allergies as of 08/08/2022       Reactions   Lidocaine Anaphylaxis   Morphine Other (See Comments)   "Body shuts down"   Procaine Hcl Anaphylaxis, Rash,  Other (See Comments)   "Anything with 'caine' in it "   Sulfonamide Derivatives Hives   Amlodipine Swelling   Norco [hydrocodone-acetaminophen] Nausea And Vomiting, Other (See Comments)   States does ok with IV form    Tramadol Nausea And Vomiting   Vytorin [ezetimibe-simvastatin] Other (See Comments)   Joint pain   Tape Itching, Other (See Comments)   Patient prefers either paper tape or Coban wrap        Medication List     TAKE these medications    acetaminophen 500 MG tablet Commonly known as: TYLENOL Take 500 mg by mouth daily as needed for headache.   amiodarone 200 MG tablet Commonly known as: PACERONE Take 1 tablet (200 mg total) by mouth daily.   apixaban 2.5 MG Tabs tablet Commonly known as: Eliquis Take 1 tablet (2.5 mg total) by mouth 2 (two) times daily.   Aspirin Low Dose 81 MG chewable tablet Generic drug: aspirin Chew 1 tablet (81 mg total) by mouth daily.   cefdinir 300 MG capsule Commonly known as: OMNICEF Take 1 capsule (300 mg total) by mouth daily for 2 days. Start taking on: August 09, 2022   dextromethorphan 30 MG/5ML liquid Commonly known as: DELSYM Take 15 mg by mouth daily as needed for cough.   docusate sodium 100 MG capsule Commonly known as: Colace Take 1 capsule (100 mg total) by mouth 2 (two) times daily. What changed: when to take this   empagliflozin 10 MG Tabs tablet Commonly known as: JARDIANCE Take 1 tablet (10 mg total) by mouth daily. Start taking on: August 10, 2022   furosemide 20 MG tablet Commonly known as: LASIX Take 1 tablet (20 mg total) by mouth daily. Start taking on: August 10, 2022 What changed: These instructions start on August 10, 2022. If you are unsure what to do until then, ask your doctor or other care provider.   hydrALAZINE 50 MG tablet Commonly known as: APRESOLINE Take 1 tablet (50 mg total) by mouth every 8 (eight) hours. What changed: when to take this   ipratropium 0.06 % nasal  spray Commonly known as: ATROVENT Place 2 sprays into both nostrils 2 (two) times daily as needed (allergies).   isosorbide mononitrate 30 MG 24 hr tablet Commonly known as: IMDUR Take 1 tablet (30 mg total) by mouth daily.   ketoconazole 2 % cream Commonly known as: NIZORAL Apply 1 Application topically daily as needed for irritation (for rash under breast).   loratadine 10 MG tablet Commonly known as: CLARITIN Take 10 mg by mouth daily as needed for allergies.   LORazepam 0.5 MG tablet Commonly known as: ATIVAN TAKE 1 TABLET BY MOUTH EVERY 6 HOURS AS NEEDED FOR ANXIETY   metoprolol succinate 50 MG 24 hr tablet Commonly known as: TOPROL-XL Take 1 tablet (50 mg total) by mouth daily.   multivitamin with minerals Tabs tablet Take 1 tablet by mouth daily.   nitroGLYCERIN 0.4 MG SL tablet Commonly known as: NITROSTAT DISSOLVE ONE TABLET UNDER THE TONGUE EVERY 5 MINUTES AS NEEDED FOR CHEST PAIN.  DO NOT EXCEED A TOTAL OF 3 DOSES IN 15 MINUTES   pantoprazole 40 MG tablet Commonly known as:  PROTONIX Take 1 tablet (40 mg total) by mouth daily.   polyethylene glycol powder 17 GM/SCOOP powder Commonly known as: GLYCOLAX/MIRALAX Take 17 g by mouth daily.   PRESCRIPTION MEDICATION See admin instructions. CPAP- At bedtime   PROBIOTIC GUMMIES PO Take 2 tablets by mouth every morning.   rosuvastatin 40 MG tablet Commonly known as: CRESTOR Take 1 tablet (40 mg total) by mouth daily.   triamcinolone 55 MCG/ACT nasal inhaler Commonly known as: NASACORT Place 1 spray into both nostrils daily as needed (for allergies).   vitamin C 1000 MG tablet Take 1,000 mg by mouth daily.   Vitamin D3 50 MCG (2000 UT) Tabs Take 2,000 Units by mouth daily.       Allergies  Allergen Reactions   Lidocaine Anaphylaxis   Morphine Other (See Comments)    "Body shuts down"   Procaine Hcl Anaphylaxis, Rash and Other (See Comments)    "Anything with 'caine' in it "   Sulfonamide  Derivatives Hives   Amlodipine Swelling   Norco [Hydrocodone-Acetaminophen] Nausea And Vomiting and Other (See Comments)    States does ok with IV form    Tramadol Nausea And Vomiting   Vytorin [Ezetimibe-Simvastatin] Other (See Comments)    Joint pain   Tape Itching and Other (See Comments)    Patient prefers either paper tape or Coban wrap    Follow-up Information     [facility]. Go in 8 day(s).   Specialty: Cardiology Why: Hospital follow up PLEASE bring a current medication list to appointment FREE valet parking, Entrance C, off [employer] information: [address] [id number] [place] [phone]                 The results of significant diagnostics from this hospitalization (including imaging, microbiology, ancillary and laboratory) are listed below for reference.    Significant Diagnostic Studies: DG Swallowing Func-Speech Pathology  Result Date: 08/07/2022 Table formatting from the original result was not included. Objective Swallowing Evaluation: Type of Study: MBS-Modified Barium Swallow Study  Patient Details Name: KHARISMA GLASNER MRN: [id number] Date of Birth: 01/02/1941 Today's Date: 08/07/2022 Time: SLP Start Time (ACUTE ONLY): 1242 -SLP Stop Time (ACUTE ONLY): 0076 SLP Time Calculation (min) (ACUTE ONLY): 12 min Past Medical History: Past Medical History: Diagnosis Date  A-fib ([place])   Allergy   CAD (coronary artery disease)   sees Dr. Shelva Majestic  cardiac stents - 2000  CHF (congestive heart failure) ([place])   Colon polyps   Complication of anesthesia   rash/hives with "caines"  Dyspnea   02/12/18 " when my heart gets out of rhythm, it has not been out of rhythym- since I have been on Tikosyn (11/2017)  Dysrhythmia   afib fib  GERD (gastroesophageal reflux disease)   takes OTC- Omeprazole- prn  Heart murmur   History of kidney stones   History of stress test   show  normal perfusion without scar or ischemia, post EF 68%  Hx of echocardiogram   show an EF 55%-60% range with grade 1 diastolic dysfunction, she had mitral anular calcification with mild MR, moderate LA dilation and mild pulmonary hypertension with a PA estimated pressure of 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 Teena Irani, clear, no repeats needed   COLONOSCOPY    CORONARY STENT PLACEMENT  2000  in [place], URETEROSCOPY AND STENT PLACEMENT Left 06/11/2022  Procedure: [place], LEFT STENT PLACEMENT;  Surgeon: Lucas Mallow, MD;  Location: [facility];  Service: Urology;  Laterality: Left;  CYSTOSCOPY/URETEROSCOPY/HOLMIUM LASER/STENT PLACEMENT Left 06/25/2022  Procedure: CYSTOSCOPY LEFT URETEROSCOPY/HOLMIUM LASER/STENT PLACEMENT;  Surgeon: Irine Seal, MD;  Location: [facility];  Service: Urology;  Laterality: Left;  1 HR FOR THIS CASE  DIRECT LARYNGOSCOPY WITH RADIAESSE INJECTION N/A 02/13/2018  Procedure: DIRECT LARYNGOSCOPY WITH RADIAESSE INJECTION;  Surgeon: Melida Quitter, MD;  Location: [facility];  Service: ENT;  Laterality: N/A;  EYE SURGERY Left   CATARACT REMOVAL  KNEE ARTHROSCOPY Left 01/06/2015  Procedure: LEFT KNEE ARTHROSCOPY, abrasion chondroplasty of the medial femerol condryl,medial and lateral menisectomy, microfracture , synovectomy of the suprpatellar pouch;  Surgeon: Latanya Maudlin, MD;  Location: [facility];  Service: Orthopedics;  Laterality: Left;  LEFT HEART CATH AND CORONARY ANGIOGRAPHY N/A 02/26/2019  Procedure: LEFT HEART CATH AND CORONARY ANGIOGRAPHY;  Surgeon: Troy Sine, MD;  Location: [facility];  Service:  Cardiovascular;  Laterality: N/A;  LEFT HEART CATH AND CORONARY ANGIOGRAPHY N/A 05/06/2022  Procedure: LEFT HEART CATH AND CORONARY ANGIOGRAPHY;  Surgeon: Nelva Bush, MD;  Location: [facility];  Service: Cardiovascular;  Laterality: N/A;  MICROLARYNGOSCOPY W/VOCAL CORD INJECTION N/A 08/07/2018  Procedure: MICROLARYNGOSCOPY WITH VOCAL CORD INJECTION OF PROLARYN;  Surgeon: Melida Quitter, MD;  Location: [facility];  Service: ENT;  Laterality: N/A;  JET VENTILATION  PACEMAKER IMPLANT N/A 05/08/2022  Procedure: PACEMAKER IMPLANT;  Surgeon: Constance Haw, MD;  Location: [facility];  Service: Cardiovascular;  Laterality: N/A;  REVERSE SHOULDER ARTHROPLASTY Right 03/23/2020  Procedure: REVERSE SHOULDER ARTHROPLASTY;  Surgeon: Nicholes Stairs, MD;  Location: [facility];  Service: Orthopedics;  Laterality: Right;  VAGINAL HYSTERECTOMY  1971 HPI: JACALYNN BUZZELL is a 81 y.o. female who presented to the ED with shortness of breath.  Hypertensive on arrival with blood pressure 177/59.  Oxygen saturation 85-89% on room air, improved with 2 L supplemental oxygen.  Chest x-ray showing central venous congestion and peribronchial thickening without focal consolidation. 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  No data recorded  Recommendations for follow up therapy are one component of a multi-disciplinary discharge planning process, led by the attending physician.  Recommendations may be updated based on patient status, additional functional criteria and insurance authorization. Assessment / Plan / Recommendation   08/06/2022   1:00 PM Clinical Impressions Clinical Impression Pt presents with grossly normal pharyngeal swallow function.  There was no penetration or aspiration of any consistencies trialed, including large straw sips  of thin liquid.  There was no pharyngeal stasis of any consistencies.  Tablet transited through esophagus during pill simulation without any stasis on esophageal sweep.  Possible cervical osteophytes observed (confirmation needed by radiology) without impeding bolus flow.  Recommend continuing regular texture diet with thin liquids.  Pt has no further ST needs.  SLP will sign off at this time. SLP Visit Diagnosis Dysphagia, unspecified (R13.10) Impact on safety and function No limitations     08/06/2022  1:00 PM Treatment Recommendations Treatment Recommendations No treatment recommended at this time     08/06/2022   1:03 PM Prognosis Prognosis for Safe Diet Advancement --   08/06/2022   1:00 PM Diet Recommendations SLP Diet Recommendations Regular solids;Thin liquid Liquid Administration via Cup;Straw Medication Administration Whole meds with liquid Compensations Slow rate;Small sips/bites Postural Changes Seated upright at 90 degrees     08/06/2022   1:00 PM Other Recommendations Oral Care Recommendations Oral care BID Follow Up Recommendations No SLP follow up Assistance recommended at discharge None Functional Status Assessment Patient has not had a recent decline in their functional status   08/06/2022   1:00 PM Frequency and Duration  Speech Therapy Frequency (ACUTE ONLY) --     08/06/2022  12:59 PM Oral Phase Oral Phase WFL Oral - Thin Straw WFL Oral - Regular WFL Oral - Pill [facility]    08/06/2022  12:59 PM Pharyngeal Phase Pharyngeal Phase WFL Pharyngeal- Thin Straw WFL Pharyngeal Material does not enter airway Pharyngeal- Regular WFL Pharyngeal Material does not enter airway Pharyngeal- Pill WFL Pharyngeal Material does not enter airway    08/06/2022   1:00 PM Cervical Esophageal Phase  Cervical Esophageal Phase [facility] Celedonio Savage, MA, CCC-SLP Acute Rehabilitation Services Office: [phone] 08/07/2022, 8:54 AM                     DG Chest 2 View  Result Date: 08/04/2022 CLINICAL DATA:  Cough and sinus congestion  EXAM: CHEST - 2 VIEW COMPARISON:  06/09/2022 FINDINGS: LEFT-sided pacemaker overlies normal cardiac silhouette. There is central venous congestion peribronchial thickening. Lungs are hyperinflated. No pleural fluid. LEFT shoulder prosthetic IMPRESSION: 1. Central venous congestion. 2. Peribronchial thickening without focal consolidation. Potential bronchitis. 3. Hyperinflated lungs. Electronically Signed   By: Suzy Bouchard M.D.   On: 08/04/2022 14:31    Microbiology: Recent Results (from the past 240 hour(s))  Resp Panel by RT-PCR (Flu A&B, Covid) Anterior Nasal Swab     Status: None   Collection Time: 08/04/22  1:50 PM   Specimen: Anterior Nasal Swab  Result Value Ref Range Status   SARS Coronavirus 2 by RT PCR NEGATIVE NEGATIVE Final    Comment: (NOTE) SARS-CoV-2 target nucleic acids are NOT DETECTED.  The SARS-CoV-2 RNA is generally detectable in upper respiratory specimens during the acute phase of infection. The lowest concentration of SARS-CoV-2 viral copies this assay can detect is 138 copies/mL. A negative result does not preclude SARS-Cov-2 infection and should not be used as the sole basis for treatment or other patient management decisions. A negative result may occur with  improper specimen collection/handling, submission of specimen other than nasopharyngeal swab, presence of viral mutation(s) within the areas targeted by this assay, and inadequate number of viral copies(<138 copies/mL). A negative result must be combined with clinical observations, patient history, and epidemiological information. The expected result is Negative.  Fact Sheet for Patients:  EntrepreneurPulse.com.au  Fact Sheet for Healthcare Providers:  IncredibleEmployment.be  This test is no t yet approved or cleared by the [place] FDA and  has been authorized for detection and/or diagnosis of SARS-CoV-2 by FDA under an Emergency Use Authorization (EUA).  This EUA will remain  in effect (meaning this test can be used) for the duration of the COVID-19 declaration under Section 564(b)(1) of the Act, 21 U.S.C.section 360bbb-3(b)(1), unless the authorization is terminated  or revoked sooner.       Influenza A by PCR NEGATIVE NEGATIVE Final   Influenza  B by PCR NEGATIVE NEGATIVE Final    Comment: (NOTE) The Xpert Xpress SARS-CoV-2/FLU/RSV plus assay is intended as an aid in the diagnosis of influenza from Nasopharyngeal swab specimens and should not be used as a sole basis for treatment. Nasal washings and aspirates are unacceptable for Xpert Xpress SARS-CoV-2/FLU/RSV testing.  Fact Sheet for Patients: EntrepreneurPulse.com.au  Fact Sheet for Healthcare Providers: IncredibleEmployment.be  This test is not yet approved or cleared by the [place] FDA and has been authorized for detection and/or diagnosis of SARS-CoV-2 by FDA under an Emergency Use Authorization (EUA). This EUA will remain in effect (meaning this test can be used) for the duration of the COVID-19 declaration under Section 564(b)(1) of the Act, 21 U.S.C. section 360bbb-3(b)(1), unless the authorization is terminated or revoked.  Performed at [facility], [address] [place], [place] [address]      Labs: Basic Metabolic Panel: Recent Labs  Lab 08/04/22 1348 08/05/22 0326 08/06/22 0153 08/07/22 0210 08/08/22 0547  NA 140 140 139 140 138  K 3.6 3.2* 3.7 3.8 4.4  CL 105 103 105 100 104  CO2 '24 24 26 29 25  '$ GLUCOSE 126* 105* 107* 101* 99  BUN '19 16 21 '$ 27* 37*  CREATININE 1.51* 1.64* 1.69* 1.88* 2.02*  CALCIUM 10.0 10.0 9.5 10.2 9.8  MG  --   --  1.9  --   --    Liver Function Tests: Recent Labs  Lab 08/04/22 1632  AST 31  ALT 23  ALKPHOS 74  BILITOT 1.2  PROT 7.3  ALBUMIN 4.0   No results for input(s): "LIPASE", "AMYLASE" in the last 168 hours. No results for input(s): "AMMONIA" in  the last 168 hours. CBC: Recent Labs  Lab 08/04/22 1348 08/06/22 0153 08/07/22 0210  WBC 9.6 8.6 5.8  HGB 10.3* 10.9* 11.9*  HCT 32.5* 32.5* 35.5*  MCV 86.4 85.5 85.5  PLT 160 162 197   Cardiac Enzymes: No results for input(s): "CKTOTAL", "CKMB", "CKMBINDEX", "TROPONINI" in the last 168 hours. BNP: BNP (last 3 results) Recent Labs    05/08/22 0135 06/09/22 1103 08/04/22 1621  BNP 520.4* 593.4* 1,908.9*    ProBNP (last 3 results) No results for input(s): "PROBNP" in the last 8760 hours.  CBG: No results for input(s): "GLUCAP" in the last 168 hours.     Signed:  Domenic Polite MD.  [facility] 08/08/2022, 10:16 AM

## 2022-08-09 ENCOUNTER — Ambulatory Visit (INDEPENDENT_AMBULATORY_CARE_PROVIDER_SITE_OTHER): Payer: Medicare Other

## 2022-08-09 ENCOUNTER — Telehealth: Payer: Self-pay

## 2022-08-09 ENCOUNTER — Encounter: Payer: Medicare Other | Admitting: Cardiology

## 2022-08-09 DIAGNOSIS — I5033 Acute on chronic diastolic (congestive) heart failure: Secondary | ICD-10-CM

## 2022-08-09 NOTE — Telephone Encounter (Signed)
Transition Care Management Follow-up Telephone Call Date of discharge and from where: 08/08/2022 [name] How have you been since you were released from the hospital? Feeling tired Any questions or concerns? No  Items Reviewed: Did the pt receive and understand the discharge instructions provided? Yes  Medications obtained and verified? Yes  Other? No  Any new allergies since your discharge? No  Dietary orders reviewed? Yes Do you have support at home? Yes   Home Care and Equipment/Supplies: Were home health services ordered? yes If so, what is the name of the agency? [facility]  Has the agency set up a time to come to the patient's home? no Were any new equipment or medical supplies ordered?  No What is the name of the medical supply agency? N/a Were you able to get the supplies/equipment? not applicable Do you have any questions related to the use of the equipment or supplies? No  Functional Questionnaire: (I = Independent and D = Dependent) ADLs: I  Bathing/Dressing- I  Meal Prep- I  Eating- I  Maintaining continence- I  Transferring/Ambulation- I  Managing Meds- I  Follow up appointments reviewed:  PCP Hospital f/u appt confirmed? Yes  Scheduled to see Dr. Sarajane Jews on 08/13/2022 @ 10:00. Are transportation arrangements needed? No  If their condition worsens, is the pt aware to call PCP or go to the Emergency Dept.? Yes Was the patient provided with contact information for the PCP's office or ED? Yes Was to pt encouraged to call back with questions or concerns? Yes

## 2022-08-10 DIAGNOSIS — J9601 Acute respiratory failure with hypoxia: Secondary | ICD-10-CM | POA: Diagnosis not present

## 2022-08-10 DIAGNOSIS — I130 Hypertensive heart and chronic kidney disease with heart failure and stage 1 through stage 4 chronic kidney disease, or unspecified chronic kidney disease: Secondary | ICD-10-CM | POA: Diagnosis not present

## 2022-08-10 DIAGNOSIS — N179 Acute kidney failure, unspecified: Secondary | ICD-10-CM | POA: Diagnosis not present

## 2022-08-10 DIAGNOSIS — N1832 Chronic kidney disease, stage 3b: Secondary | ICD-10-CM | POA: Diagnosis not present

## 2022-08-10 DIAGNOSIS — I5033 Acute on chronic diastolic (congestive) heart failure: Secondary | ICD-10-CM | POA: Diagnosis not present

## 2022-08-11 LAB — CUP PACEART REMOTE DEVICE CHECK
Battery Remaining Longevity: 148 mo
Battery Voltage: 3.2 V
Brady Statistic AP VP Percent: 0.04 %
Brady Statistic AP VS Percent: 96.33 %
Brady Statistic AS VP Percent: 0 %
Brady Statistic AS VS Percent: 3.63 %
Brady Statistic RA Percent Paced: 96.36 %
Brady Statistic RV Percent Paced: 0.05 %
Date Time Interrogation Session: 20231006041336
Implantable Lead Implant Date: 20230705
Implantable Lead Implant Date: 20230705
Implantable Lead Location: 753859
Implantable Lead Location: 753860
Implantable Lead Model: 3830
Implantable Lead Model: 5076
Implantable Pulse Generator Implant Date: 20230705
Lead Channel Impedance Value: 399 Ohm
Lead Channel Impedance Value: 399 Ohm
Lead Channel Impedance Value: 532 Ohm
Lead Channel Impedance Value: 532 Ohm
Lead Channel Pacing Threshold Amplitude: 1.125 V
Lead Channel Pacing Threshold Amplitude: 1.25 V
Lead Channel Pacing Threshold Pulse Width: 0.4 ms
Lead Channel Pacing Threshold Pulse Width: 0.4 ms
Lead Channel Sensing Intrinsic Amplitude: 17.875 mV
Lead Channel Sensing Intrinsic Amplitude: 17.875 mV
Lead Channel Sensing Intrinsic Amplitude: 2.375 mV
Lead Channel Sensing Intrinsic Amplitude: 2.375 mV
Lead Channel Setting Pacing Amplitude: 2.25 V
Lead Channel Setting Pacing Amplitude: 2.5 V
Lead Channel Setting Pacing Pulse Width: 0.4 ms
Lead Channel Setting Sensing Sensitivity: 0.9 mV

## 2022-08-12 ENCOUNTER — Telehealth: Payer: Self-pay | Admitting: Family Medicine

## 2022-08-12 ENCOUNTER — Encounter: Payer: Medicare Other | Admitting: Cardiology

## 2022-08-12 DIAGNOSIS — J9601 Acute respiratory failure with hypoxia: Secondary | ICD-10-CM | POA: Diagnosis not present

## 2022-08-12 DIAGNOSIS — N1832 Chronic kidney disease, stage 3b: Secondary | ICD-10-CM | POA: Diagnosis not present

## 2022-08-12 DIAGNOSIS — N179 Acute kidney failure, unspecified: Secondary | ICD-10-CM | POA: Diagnosis not present

## 2022-08-12 DIAGNOSIS — I5033 Acute on chronic diastolic (congestive) heart failure: Secondary | ICD-10-CM | POA: Diagnosis not present

## 2022-08-12 DIAGNOSIS — I130 Hypertensive heart and chronic kidney disease with heart failure and stage 1 through stage 4 chronic kidney disease, or unspecified chronic kidney disease: Secondary | ICD-10-CM | POA: Diagnosis not present

## 2022-08-12 NOTE — Telephone Encounter (Signed)
Please okay these orders  ?

## 2022-08-12 NOTE — Telephone Encounter (Signed)
Physical therapy evaluation, nursing 1x5

## 2022-08-12 NOTE — Telephone Encounter (Signed)
Okay for verbal 

## 2022-08-13 ENCOUNTER — Inpatient Hospital Stay: Payer: Medicare Other | Admitting: Family Medicine

## 2022-08-13 ENCOUNTER — Ambulatory Visit (INDEPENDENT_AMBULATORY_CARE_PROVIDER_SITE_OTHER): Payer: Medicare Other | Admitting: Family Medicine

## 2022-08-13 ENCOUNTER — Encounter: Payer: Self-pay | Admitting: Family Medicine

## 2022-08-13 VITALS — BP 132/64 | HR 68 | Temp 98.2°F | Wt 171.1 lb

## 2022-08-13 DIAGNOSIS — N1832 Chronic kidney disease, stage 3b: Secondary | ICD-10-CM | POA: Diagnosis not present

## 2022-08-13 DIAGNOSIS — J40 Bronchitis, not specified as acute or chronic: Secondary | ICD-10-CM | POA: Diagnosis not present

## 2022-08-13 DIAGNOSIS — I480 Paroxysmal atrial fibrillation: Secondary | ICD-10-CM

## 2022-08-13 DIAGNOSIS — I10 Essential (primary) hypertension: Secondary | ICD-10-CM | POA: Diagnosis not present

## 2022-08-13 DIAGNOSIS — I5033 Acute on chronic diastolic (congestive) heart failure: Secondary | ICD-10-CM

## 2022-08-13 DIAGNOSIS — J9601 Acute respiratory failure with hypoxia: Secondary | ICD-10-CM | POA: Diagnosis not present

## 2022-08-13 LAB — BASIC METABOLIC PANEL
BUN: 25 mg/dL — ABNORMAL HIGH (ref 6–23)
CO2: 28 mEq/L (ref 19–32)
Calcium: 10.4 mg/dL (ref 8.4–10.5)
Chloride: 103 mEq/L (ref 96–112)
Creatinine, Ser: 1.96 mg/dL — ABNORMAL HIGH (ref 0.40–1.20)
GFR: 23.25 mL/min — ABNORMAL LOW (ref >60.00)
Glucose, Bld: 115 mg/dL — ABNORMAL HIGH (ref 70–99)
Potassium: 3.4 mEq/L — ABNORMAL LOW (ref 3.5–5.1)
Sodium: 139 mEq/L (ref 135–145)

## 2022-08-13 NOTE — Addendum Note (Signed)
Addended by: Alysia Penna A on: 08/13/2022 02:49 PM   Modules accepted: Level of Service

## 2022-08-13 NOTE — Progress Notes (Signed)
Remote pacemaker transmission.   

## 2022-08-13 NOTE — Progress Notes (Signed)
   Subjective:    Patient ID: Barbara [name], female    DOB: November 24, 1940, 81 y.o.   MRN: [id number]  HPI Here for a transitional care visit to follow up a hospital stay from 08-04-22 to 10-5 23 for acute respiratory failure. She presented with SOB along with fever and a productive cough. CXR revealed peribronchial thickening, so she was treated for a bronchitis. This also triggered an acute exacerbation of CHF as well as an AKI. She was diuresed with Lasix, and her breathing became much easier. She was on oxygen most of this stay, but this was stopped prior to DC. She was sent home with 4 more days of Cefdinir. She was told to hold the Lasix for 2 days and to then resume it. Her creatinine rose from 1.47 on  admission to 2.02 at DC. Today she feels much better with some fatigue but no cough or SOB.    Review of Systems  Constitutional:  Positive for fatigue. Negative for chills and fever.  HENT: Negative.    Respiratory:  Positive for shortness of breath. Negative for cough and wheezing.   Cardiovascular: Negative.   Gastrointestinal: Negative.   Genitourinary: Negative.        Objective:   Physical Exam Constitutional:      Appearance: Normal appearance.  Cardiovascular:     Rate and Rhythm: Normal rate. Rhythm irregular.     Pulses: Normal pulses.     Heart sounds: Normal heart sounds.  Pulmonary:     Effort: Pulmonary effort is normal.     Breath sounds: Normal breath sounds.  Musculoskeletal:     Right lower leg: No edema.     Left lower leg: No edema.  Lymphadenopathy:     Cervical: No cervical adenopathy.  Neurological:     Mental Status: She is alert.           Assessment & Plan:  Her bronchitis has resolved. Her CHF is back to baseline. We will check a BMET today to follow her renal function. She will see her cardiologist, Dr. Claiborne Billings, on 09-17-22. We spent a total of (35   ) minutes reviewing records and discussing these issues.  Alysia Penna, MD

## 2022-08-14 ENCOUNTER — Other Ambulatory Visit: Payer: Self-pay

## 2022-08-14 ENCOUNTER — Encounter (HOSPITAL_COMMUNITY): Payer: Self-pay

## 2022-08-14 ENCOUNTER — Ambulatory Visit (HOSPITAL_COMMUNITY)
Admit: 2022-08-14 | Discharge: 2022-08-14 | Disposition: A | Discharge status: Home / Self Care | Payer: Medicare Other | Attending: Physician Assistant | Admitting: Physician Assistant

## 2022-08-14 ENCOUNTER — Telehealth (HOSPITAL_COMMUNITY): Payer: Self-pay | Admitting: *Deleted

## 2022-08-14 ENCOUNTER — Telehealth (HOSPITAL_COMMUNITY): Payer: Self-pay

## 2022-08-14 VITALS — BP 140/70 | HR 59 | Wt 173.2 lb

## 2022-08-14 DIAGNOSIS — I5033 Acute on chronic diastolic (congestive) heart failure: Secondary | ICD-10-CM | POA: Diagnosis not present

## 2022-08-14 DIAGNOSIS — I480 Paroxysmal atrial fibrillation: Secondary | ICD-10-CM | POA: Insufficient documentation

## 2022-08-14 DIAGNOSIS — I130 Hypertensive heart and chronic kidney disease with heart failure and stage 1 through stage 4 chronic kidney disease, or unspecified chronic kidney disease: Secondary | ICD-10-CM | POA: Diagnosis not present

## 2022-08-14 DIAGNOSIS — I340 Nonrheumatic mitral (valve) insufficiency: Secondary | ICD-10-CM | POA: Insufficient documentation

## 2022-08-14 DIAGNOSIS — Z79899 Other long term (current) drug therapy: Secondary | ICD-10-CM | POA: Diagnosis not present

## 2022-08-14 DIAGNOSIS — I495 Sick sinus syndrome: Secondary | ICD-10-CM | POA: Insufficient documentation

## 2022-08-14 DIAGNOSIS — G4733 Obstructive sleep apnea (adult) (pediatric): Secondary | ICD-10-CM | POA: Diagnosis not present

## 2022-08-14 DIAGNOSIS — Z7984 Long term (current) use of oral hypoglycemic drugs: Secondary | ICD-10-CM | POA: Diagnosis not present

## 2022-08-14 DIAGNOSIS — Z950 Presence of cardiac pacemaker: Secondary | ICD-10-CM | POA: Insufficient documentation

## 2022-08-14 DIAGNOSIS — I2510 Atherosclerotic heart disease of native coronary artery without angina pectoris: Secondary | ICD-10-CM | POA: Diagnosis not present

## 2022-08-14 DIAGNOSIS — J9601 Acute respiratory failure with hypoxia: Secondary | ICD-10-CM | POA: Diagnosis not present

## 2022-08-14 DIAGNOSIS — Z7901 Long term (current) use of anticoagulants: Secondary | ICD-10-CM | POA: Insufficient documentation

## 2022-08-14 DIAGNOSIS — Z955 Presence of coronary angioplasty implant and graft: Secondary | ICD-10-CM | POA: Diagnosis not present

## 2022-08-14 DIAGNOSIS — N1832 Chronic kidney disease, stage 3b: Secondary | ICD-10-CM | POA: Diagnosis not present

## 2022-08-14 DIAGNOSIS — N179 Acute kidney failure, unspecified: Secondary | ICD-10-CM | POA: Diagnosis not present

## 2022-08-14 DIAGNOSIS — I5032 Chronic diastolic (congestive) heart failure: Secondary | ICD-10-CM | POA: Diagnosis not present

## 2022-08-14 MED ORDER — POTASSIUM CHLORIDE ER 10 MEQ PO TBCR: 10.0000 meq | EXTENDED_RELEASE_TABLET | Freq: Every day | ORAL | 3 refills | Status: DC

## 2022-08-14 NOTE — Progress Notes (Signed)
k

## 2022-08-14 NOTE — Telephone Encounter (Signed)
Heart Failure Patient Advocate Encounter  Patient medication assistance application for Jardiance [facility]) has been started. Patient portion is completed and signed, provider information will need to be completed before submitting.  Application form(s) attached to chart under 'media' tab.  Clista Bernhardt, CPhT Rx Patient Advocate Phone: [phone]

## 2022-08-14 NOTE — Progress Notes (Signed)
HEART & VASCULAR TRANSITION OF CARE CONSULT NOTE     Referring Physician: Dr. Broadus John  Primary Care: Laurey Morale, MD Primary Cardiologist: Shelva Majestic, MD   HPI: Referred to clinic by Dr. Broadus John for heart failure consultation.   81 y/o female w/ h/o chronic diastolic heart failure, CAD s/p prior LAD stenting, Atrial fibrillation/ SSS s/p PPM, OSA on CPAP, Stage IIIB CKD.  cMRI in 2021 demonstrated LV apical hypertrophy measuring up to 61m (717min basal posterior wall), consistent with apical hypertrophic cardiomyopathy, patchy late gadolinium enhancement at apex and RV insertion site, consistent with apical HCM. LGE accounts for 2% of total myocardial mass, LVEF 76%, RVEF 78%.   Most recent Echo 7/23 EF > 75%, RV normal, mod MR. LHC showed  mild-moderate, non-obstructive coronary artery disease, patent LAD stent. LVEDP 5 mmHg.   Presented to ED on 10/1 w/ SOB. Found to be in acute hypoxic respiratory failure, in the setting of a/c CHF and suspected bronchitis. She admitted to missing several days of lasix. She was admitted by IM and diuresed w/ IV Lasix. Treated w/ 5 day course of abx and nebs. Breathing improved, weaned off Brun[place]. Started on Jardiance and given Rx for Lasix refills at d/c. D/c Wt 175 lb. [facility]   She presents today for assessment. Wt down since discharge at 173 lb but still SOB, NYHA Class II-III. ReDs 31%. No LEE. Denies CP. Reports full compliance w/ meds including lasix. BP 140/70 prior to taking AM meds.   She says she has been referred for genetic testing given her HCM. Awaiting appt.   Of note, she was seen by her PCP yesterday and labs done. Scr was down from admission, 2.02>>1.96 (b/l ~1.7). K was low at 3.4. PCP ordered to start 10 mEq of KCl daily.     Cardiac Testing     05/06/22: LHC Conclusions: Mild-moderate, non-obstructive coronary artery disease. Patent proximal/mid LAD stent with up to 30% in-stent restenosis. Low left  ventricular filling pressure (LVEDP ~5 mmHg). Recommendations: Continue secondary prevention of coronary artery disease. Proceed with pacemaker placement per Dr. KlCaryl Comes  05/04/22: TTE  1. Left ventricular ejection fraction, by estimation, is >75%. The left  ventricle has hyperdynamic function. The left ventricle has no regional  wall motion abnormalities. Left ventricular diastolic parameters are  indeterminate. Elevated left atrial  pressure.   2. Right ventricular systolic function is normal. The right ventricular  size is normal. There is mildly elevated pulmonary artery systolic  pressure.   3. Left atrial size was moderately dilated.   4. The mitral valve is normal in structure. Moderate mitral valve  regurgitation. No evidence of mitral stenosis.   5. The aortic valve is tricuspid. Aortic valve regurgitation is not  visualized. Aortic valve sclerosis is present, with no evidence of aortic  valve stenosis.   6. The inferior vena cava is normal in size with greater than 50%  respiratory variability, suggesting right atrial pressure of 3 mmHg.    10/08/2020; TTE  1. Left ventricular ejection fraction, by estimation, is 70 to 75%. The  left ventricle has hyperdynamic function. The left ventricle has no  regional wall motion abnormalities. There is moderate eccentric left  ventricular hypertrophy of the apical  segment. Left ventricular diastolic parameters are indeterminate. Elevated  left ventricular end-diastolic pressure.   2. Right ventricular systolic function is normal. The right ventricular  size is normal. Tricuspid regurgitation signal is inadequate for assessing  PA  pressure.   3. The mitral valve is abnormal. Mild mitral valve regurgitation.  Moderate mitral annular calcification.   4. The aortic valve is grossly normal. There is mild calcification of the  aortic valve. There is mild thickening of the aortic valve. Aortic valve  regurgitation is not visualized. Mild  aortic valve sclerosis is present,  with no evidence of aortic valve  stenosis.   Comparison(s): No significant change from prior study.   05/19/2020: c.MRI IMPRESSION: 1. LV apical hypertrophy measuring up to 33m (723min basal posterior wall), consistent with apical hypertrophic cardiomyopathy 2. Patchy late gadolinium enhancement at apex and RV insertion site, consistent with apical HCM. LGE accounts for 2% of total myocardial mass 3.  Normal LV size with hyperdynamic systolic function (EF 7699%4.  Normal RV size with hyperdynamic systolic function (EF 7837%     02/26/2019: LHC Prox RCA to Mid RCA lesion is 15% stenosed. Ost LAD to Prox LAD lesion is 20% stenosed. Prox Cx lesion is 20% stenosed.   Mild non-obstructive coronary artery disease with a patent proximal LAD stent with mild 20% intimal hyperplasia (inserted  09/1999); 20% proximal left circumflex narrowing, and mild irregularity of 10 to 15% in the mid RCA.   Hyperdynamic LV function with an "Ace of Spade "configuration and near cavity obliteration in the mid to apical segment during systole.  There is evidence for left ventricular hypertrophy.  LVEDP is 16 mm.     Review of Systems: [y] = yes, '[ ]'$  = no   General: Weight gain '[ ]'$ ; Weight loss '[ ]'$ ; Anorexia '[ ]'$ ; Fatigue '[ ]'$ ; Fever '[ ]'$ ; Chills '[ ]'$ ; Weakness '[ ]'$   Cardiac: Chest pain/pressure '[ ]'$ ; Resting SOB '[ ]'$ ; Exertional SOB '[ ]'$ ; Orthopnea '[ ]'$ ; Pedal Edema '[ ]'$ ; Palpitations '[ ]'$ ; Syncope '[ ]'$ ; Presyncope '[ ]'$ ; Paroxysmal nocturnal dyspnea'[ ]'$   Pulmonary: Cough '[ ]'$ ; Wheezing'[ ]'$ ; Hemoptysis'[ ]'$ ; Sputum '[ ]'$ ; Snoring '[ ]'$   GI: Vomiting'[ ]'$ ; Dysphagia'[ ]'$ ; Melena'[ ]'$ ; Hematochezia '[ ]'$ ; Heartburn'[ ]'$ ; Abdominal pain '[ ]'$ ; Constipation '[ ]'$ ; Diarrhea '[ ]'$ ; BRBPR '[ ]'$   GU: Hematuria'[ ]'$ ; Dysuria '[ ]'$ ; Nocturia'[ ]'$   Vascular: Pain in legs with walking '[ ]'$ ; Pain in feet with lying flat '[ ]'$ ; Non-healing sores '[ ]'$ ; Stroke '[ ]'$ ; TIA '[ ]'$ ; Slurred speech '[ ]'$ ;  Neuro: Headaches'[ ]'$ ; Vertigo'[ ]'$ ; Seizures[  ]; Paresthesias'[ ]'$ ;Blurred vision '[ ]'$ ; Diplopia '[ ]'$ ; Vision changes '[ ]'$   Ortho/Skin: Arthritis '[ ]'$ ; Joint pain '[ ]'$ ; Muscle pain '[ ]'$ ; Joint swelling '[ ]'$ ; Back Pain '[ ]'$ ; Rash '[ ]'$   Psych: Depression'[ ]'$ ; Anxiety'[ ]'$   Heme: Bleeding problems '[ ]'$ ; Clotting disorders '[ ]'$ ; Anemia '[ ]'$   Endocrine: Diabetes '[ ]'$ ; Thyroid dysfunction'[ ]'$    Past Medical History:  Diagnosis Date   A-fib (HCC)    Allergy    CAD (coronary artery disease)    sees Dr. ThShelva Majesticcardiac stents - 2000   [facility]F ([facility]estive heart failure) ([facility]   Colon polyps    Complication of anesthesia    rash/hives with "caines"   Dyspnea    02/12/18 " when my heart gets out of rhythm, it has not been out of rhythym- since I have been on Tikosyn (11/2017)   Dysrhythmia    afib fib   GERD (gastroesophageal reflux disease)    takes OTC- Omeprazole- prn   Heart murmur    History of kidney stones    History of  stress test    show normal perfusion without scar or ischemia, post EF 68%   Hx of echocardiogram    show an EF 55%-60% range with grade 1 diastolic dysfunction, she had mitral anular calcification with mild MR, moderate LA dilation and mild pulmonary hypertension with a PA estimated pressure of 49m   Hyperlipidemia    Hypertension    NSTEMI (non-ST elevated myocardial infarction) ([facility]    Osteoarthritis    Pacemaker    Pneumonia    hx of 2015    PONV (postoperative nausea and vomiting)    Sleep apnea     Current Outpatient Medications  Medication Sig Dispense Refill   acetaminophen (TYLENOL) 500 MG tablet Take 500 mg by mouth daily as needed for headache.     amiodarone (PACERONE) 200 MG tablet Take 1 tablet (200 mg total) by mouth daily. 90 tablet 3   apixaban (ELIQUIS) 2.5 MG TABS tablet Take 1 tablet (2.5 mg total) by mouth 2 (two) times daily. 60 tablet 5   Ascorbic Acid (VITAMIN C) 1000 MG tablet Take 1,000 mg by mouth daily.     aspirin 81 MG chewable tablet Chew 1 tablet (81 mg total) by mouth daily. 30 tablet 0    Cholecalciferol (VITAMIN D3) 2000 units TABS Take 2,000 Units by mouth daily.     dextromethorphan (DELSYM) 30 MG/5ML liquid Take 15 mg by mouth daily as needed for cough.     docusate sodium (COLACE) 100 MG capsule Take 100 mg by mouth as needed for mild constipation.     empagliflozin (JARDIANCE) 10 MG TABS tablet Take 1 tablet (10 mg total) by mouth daily. 30 tablet 0   furosemide (LASIX) 20 MG tablet Take 1 tablet (20 mg total) by mouth daily. 30 tablet 0   hydrALAZINE (APRESOLINE) 50 MG tablet Take 1 tablet (50 mg total) by mouth every 8 (eight) hours. 90 tablet 5   ipratropium (ATROVENT) 0.06 % nasal spray Place 2 sprays into both nostrils 2 (two) times daily as needed (allergies).  5   isosorbide mononitrate (IMDUR) 30 MG 24 hr tablet Take 1 tablet (30 mg total) by mouth daily. 90 tablet 3   ketoconazole (NIZORAL) 2 % cream Apply 1 Application topically daily as needed for irritation (for rash under breast).     loratadine (CLARITIN) 10 MG tablet Take 10 mg by mouth daily as needed for allergies.     metoprolol succinate (TOPROL-XL) 50 MG 24 hr tablet Take 1 tablet (50 mg total) by mouth daily. 90 tablet 2   Multiple Vitamin (MULTIVITAMIN WITH MINERALS) TABS tablet Take 1 tablet by mouth daily.     nitroGLYCERIN (NITROSTAT) 0.4 MG SL tablet DISSOLVE ONE TABLET UNDER THE TONGUE EVERY 5 MINUTES AS NEEDED FOR CHEST PAIN.  DO NOT EXCEED A TOTAL OF 3 DOSES IN 15 MINUTES 25 tablet 3   pantoprazole (PROTONIX) 40 MG tablet Take 1 tablet (40 mg total) by mouth daily. 30 tablet 10   polyethylene glycol powder (GLYCOLAX/MIRALAX) 17 GM/SCOOP powder Take 17 g by mouth daily. 238 g 0   PRESCRIPTION MEDICATION See admin instructions. CPAP- At bedtime     Probiotic Product (PROBIOTIC GUMMIES PO) Take 2 tablets by mouth every morning.     rosuvastatin (CRESTOR) 40 MG tablet Take 1 tablet (40 mg total) by mouth daily. 90 tablet 3   triamcinolone (NASACORT) 55 MCG/ACT nasal inhaler Place 1 spray into both  nostrils daily as needed (for allergies).      LORazepam (  ATIVAN) 0.5 MG tablet TAKE 1 TABLET BY MOUTH EVERY 6 HOURS AS NEEDED FOR ANXIETY 60 tablet 5   No current facility-administered medications for this encounter.    Allergies  Allergen Reactions   Lidocaine Anaphylaxis   Morphine Other (See Comments)    "Body shuts down"   Procaine Hcl Anaphylaxis, Rash and Other (See Comments)    "Anything with 'caine' in it "   Sulfonamide Derivatives Hives   Amlodipine Swelling   Norco [Hydrocodone-Acetaminophen] Nausea And Vomiting and Other (See Comments)    States does ok with IV form    Tramadol Nausea And Vomiting   Vytorin [Ezetimibe-Simvastatin] Other (See Comments)    Joint pain   Tape Itching and Other (See Comments)    Patient prefers either paper tape or Coban wrap      Social History   Socioeconomic History   Marital status: Widowed    Spouse name: Not on file   Number of children: 2   Years of education: Not on file   Highest education level: High school graduate  Occupational History   Occupation: Retired  Tobacco Use   Smoking status: Former    Years: 40.00    Types: Cigarettes    Quit date: 11/04/1996    Years since quitting: 25.7   Smokeless tobacco: Never  Vaping Use   Vaping Use: Never used  Substance and Sexual Activity   Alcohol use: No    Alcohol/week: 0.0 standard drinks of alcohol   Drug use: No   Sexual activity: Not Currently  Other Topics Concern   Not on file  Social History Narrative   Not on file   Social Determinants of Health   Financial Resource Strain: Low Risk  (08/05/2022)   Overall Financial Resource Strain (CARDIA)    Difficulty of Paying Living Expenses: Not hard at all  Food Insecurity: No Food Insecurity (08/05/2022)   Hunger Vital Sign    Worried About Running Out of Food in the Last Year: Never true    [place] in the Last Year: Never true  Transportation Needs: No Transportation Needs (08/05/2022)   PRAPARE -  Hydrologist (Medical): No    Lack of Transportation (Non-Medical): No  Physical Activity: Insufficiently Active (07/02/2022)   Exercise Vital Sign    Days of Exercise per Week: 1 day    Minutes of Exercise per Session: 20 min  Stress: No Stress Concern Present (07/02/2022)   [place]    Feeling of Stress : Not at all  Social Connections: Moderately Integrated (07/02/2022)   Social Connection and Isolation Panel [NHANES]    Frequency of Communication with Friends and Family: More than three times a week    Frequency of Social Gatherings with Friends and Family: More than three times a week    Attends Religious Services: More than 4 times per year    Active Member of [employer] or Organizations: Yes    Attends Archivist Meetings: More than 4 times per year    Marital Status: Widowed  Intimate Partner Violence: Not At Risk (07/02/2022)   Humiliation, Afraid, Rape, and Kick questionnaire    Fear of Current or Ex-Partner: No    Emotionally Abused: No    Physically Abused: No    Sexually Abused: No      Family History  Problem Relation Age of Onset   Cancer Maternal Grandmother    Heart disease  Maternal Grandfather     Vitals:   08/14/22 1217  BP: (!) 140/70  Pulse: (!) 59  SpO2: 97%  Weight: 78.6 kg (173 lb 3.2 oz)    PHYSICAL EXAM: ReDs 31%  General:  Well appearing. No respiratory difficulty HEENT: normal Neck: supple. no JVD. Carotids 2+ bilat; no bruits. No lymphadenopathy or thryomegaly appreciated. Cor: PMI nondisplaced. Regular rate & rhythm. No rubs, gallops or murmurs. Lungs: clear Abdomen: soft, nontender, nondistended. No hepatosplenomegaly. No bruits or masses. Good bowel sounds. Extremities: no cyanosis, clubbing, rash, edema Neuro: alert & oriented x 3, cranial nerves grossly intact. moves all 4 extremities w/o difficulty. Affect pleasant.  ECG: atrial paced  60 bmp, LVH   ASSESSMENT & PLAN:  1. Chronic Diastolic Heart Failure/ HCM  - Echo 7/23 EF > 75%, RV normal - cMRI 2021 LV apical hypertrophy. + Patchy LGE at apex and RV insertion site, consistent with apical HCM - has been referred for genetic testing. Awaiting appt - NYHA Class II. Euvolemic on exam. Wt down from d/c wt. ReDs 31%. Labs from yesterday reviewed, SCr stable.  - Continue Jardiance 10 mg daily  - Continue Lasix 20 mg daily  - no MRA w/ CKD  - Continue Toprol XL 50 mg daily  - discussed continuation of daily wts and strict med compliance - f/u w/ gen cards for further management   2. CAD - s/p prior LAD stent, patent on recent cath 7/23  - denies CP - continue medical therapy, followed by Dr. Claiborne Billings   3. PAF/ SSS  - s/p PPM  - on Eliquis 2.5 mg bid. Denies abnormal bleeding   4. Hypertension - mildly elevated 140/90 but his is prior to AM meds - continue home regimen  5. Stage IIlB CKD - labs followed by PCP (done yesterday, personally revived and stable)  - continue Jardianec 10 mg daily (PCP working on patient assistance)   6. Mitral Regurgitation - Mod on recent echo, likely functional. LA mod dilated   - HF optimization + Afib control per above  NYHA II GDMT  Diuretic- Lasix 20 mg daily  BB- Toprol XL 50 mg daily  Ace/ARB/ARNI No CKD  MRA No CKD  SGLT2i Jardianec 10 mg daily     Referred to HFSW (PCP, Medications, Transportation, ETOH Abuse, Drug Abuse, Insurance, Museum/gallery curator ): No Refer to Pharmacy: No Refer to Home Health: No  Refer to Advanced Heart Failure Clinic: No  Refer to General Cardiology: Yes   Follow up: Keep f/u w/ Dr. Claiborne Billings in 4 wks

## 2022-08-14 NOTE — Telephone Encounter (Signed)
Called to confirm Heart & Vascular Transitions of Care appointment at 12 noon on 08/14/22. Patient reminded to bring all medications and pill box organizer with them. Confirmed patient has transportation. Gave directions, instructed to utilize [place] parking.  Confirmed appointment prior to ending call.    Earnestine Leys, BSN, Clinical cytogeneticist Only

## 2022-08-14 NOTE — Patient Instructions (Signed)
Thank you for your visit today.  Thank you for allowing [place] to provider your heart failure care after your recent hospitalization. Please follow-up with Dr. Claiborne Billings as scheduled  If you have any questions, issues, or concerns before your next appointment please call our office at [phone], opt. [phone] and leave a message for the triage nurse.

## 2022-08-14 NOTE — Progress Notes (Signed)
ReDS Vest / Clip - 08/14/22 1300       ReDS Vest / Clip   Station Marker C    Ruler Value 29    ReDS Value Range Low volume    ReDS Actual Value 31

## 2022-08-14 NOTE — Telephone Encounter (Signed)
Spoke with Tammy with [facility], advised that VO requested have been approved by Dr Sarajane Jews

## 2022-08-15 DIAGNOSIS — N202 Calculus of kidney with calculus of ureter: Secondary | ICD-10-CM | POA: Diagnosis not present

## 2022-08-16 ENCOUNTER — Other Ambulatory Visit: Payer: Self-pay | Admitting: Cardiovascular Disease

## 2022-08-16 ENCOUNTER — Other Ambulatory Visit: Payer: Self-pay | Admitting: Cardiology

## 2022-08-16 NOTE — Telephone Encounter (Signed)
Prescription refill request for Eliquis received. Indication:Afib Last office visit:8/23 Scr:1.9 Age: 81 Weight:78.6 kg  Prescription refilled

## 2022-08-21 DIAGNOSIS — N1832 Chronic kidney disease, stage 3b: Secondary | ICD-10-CM | POA: Diagnosis not present

## 2022-08-21 DIAGNOSIS — I5033 Acute on chronic diastolic (congestive) heart failure: Secondary | ICD-10-CM | POA: Diagnosis not present

## 2022-08-21 DIAGNOSIS — J9601 Acute respiratory failure with hypoxia: Secondary | ICD-10-CM | POA: Diagnosis not present

## 2022-08-21 DIAGNOSIS — N179 Acute kidney failure, unspecified: Secondary | ICD-10-CM | POA: Diagnosis not present

## 2022-08-21 DIAGNOSIS — I130 Hypertensive heart and chronic kidney disease with heart failure and stage 1 through stage 4 chronic kidney disease, or unspecified chronic kidney disease: Secondary | ICD-10-CM | POA: Diagnosis not present

## 2022-08-23 DIAGNOSIS — J9601 Acute respiratory failure with hypoxia: Secondary | ICD-10-CM | POA: Diagnosis not present

## 2022-08-23 DIAGNOSIS — I130 Hypertensive heart and chronic kidney disease with heart failure and stage 1 through stage 4 chronic kidney disease, or unspecified chronic kidney disease: Secondary | ICD-10-CM | POA: Diagnosis not present

## 2022-08-23 DIAGNOSIS — I5033 Acute on chronic diastolic (congestive) heart failure: Secondary | ICD-10-CM | POA: Diagnosis not present

## 2022-08-23 DIAGNOSIS — N179 Acute kidney failure, unspecified: Secondary | ICD-10-CM | POA: Diagnosis not present

## 2022-08-23 DIAGNOSIS — N1832 Chronic kidney disease, stage 3b: Secondary | ICD-10-CM | POA: Diagnosis not present

## 2022-08-27 DIAGNOSIS — J9601 Acute respiratory failure with hypoxia: Secondary | ICD-10-CM | POA: Diagnosis not present

## 2022-08-27 DIAGNOSIS — N1832 Chronic kidney disease, stage 3b: Secondary | ICD-10-CM | POA: Diagnosis not present

## 2022-08-27 DIAGNOSIS — I130 Hypertensive heart and chronic kidney disease with heart failure and stage 1 through stage 4 chronic kidney disease, or unspecified chronic kidney disease: Secondary | ICD-10-CM | POA: Diagnosis not present

## 2022-08-27 DIAGNOSIS — I5033 Acute on chronic diastolic (congestive) heart failure: Secondary | ICD-10-CM | POA: Diagnosis not present

## 2022-08-27 DIAGNOSIS — N179 Acute kidney failure, unspecified: Secondary | ICD-10-CM | POA: Diagnosis not present

## 2022-08-28 DIAGNOSIS — J9601 Acute respiratory failure with hypoxia: Secondary | ICD-10-CM | POA: Diagnosis not present

## 2022-08-28 DIAGNOSIS — N1832 Chronic kidney disease, stage 3b: Secondary | ICD-10-CM | POA: Diagnosis not present

## 2022-08-28 DIAGNOSIS — I130 Hypertensive heart and chronic kidney disease with heart failure and stage 1 through stage 4 chronic kidney disease, or unspecified chronic kidney disease: Secondary | ICD-10-CM | POA: Diagnosis not present

## 2022-08-28 DIAGNOSIS — N179 Acute kidney failure, unspecified: Secondary | ICD-10-CM | POA: Diagnosis not present

## 2022-08-28 DIAGNOSIS — I5033 Acute on chronic diastolic (congestive) heart failure: Secondary | ICD-10-CM | POA: Diagnosis not present

## 2022-08-30 ENCOUNTER — Other Ambulatory Visit: Payer: Self-pay | Admitting: Cardiovascular Disease

## 2022-08-30 DIAGNOSIS — N202 Calculus of kidney with calculus of ureter: Secondary | ICD-10-CM | POA: Diagnosis not present

## 2022-09-02 ENCOUNTER — Telehealth: Payer: Self-pay | Admitting: Family Medicine

## 2022-09-02 ENCOUNTER — Other Ambulatory Visit (HOSPITAL_COMMUNITY): Payer: Self-pay

## 2022-09-02 DIAGNOSIS — I5033 Acute on chronic diastolic (congestive) heart failure: Secondary | ICD-10-CM | POA: Diagnosis not present

## 2022-09-02 DIAGNOSIS — N179 Acute kidney failure, unspecified: Secondary | ICD-10-CM | POA: Diagnosis not present

## 2022-09-02 DIAGNOSIS — I130 Hypertensive heart and chronic kidney disease with heart failure and stage 1 through stage 4 chronic kidney disease, or unspecified chronic kidney disease: Secondary | ICD-10-CM | POA: Diagnosis not present

## 2022-09-02 DIAGNOSIS — J9601 Acute respiratory failure with hypoxia: Secondary | ICD-10-CM | POA: Diagnosis not present

## 2022-09-02 DIAGNOSIS — N1832 Chronic kidney disease, stage 3b: Secondary | ICD-10-CM | POA: Diagnosis not present

## 2022-09-02 NOTE — Telephone Encounter (Signed)
Pharmacy updated.

## 2022-09-02 NOTE — Telephone Encounter (Signed)
Patient requesting a refill of empagliflozin (JARDIANCE) 10 MG TABS tablet was prescribed by another provider while she was inpatient  [place], [place] Phone:  [phone]  Fax:  [phone]

## 2022-09-02 NOTE — Telephone Encounter (Signed)
Call in [place] refills for one year

## 2022-09-03 ENCOUNTER — Other Ambulatory Visit: Payer: Self-pay

## 2022-09-03 MED ORDER — EMPAGLIFLOZIN 10 MG PO TABS: 10.0000 mg | ORAL_TABLET | Freq: Every day | ORAL | 3 refills | Status: DC

## 2022-09-03 NOTE — Telephone Encounter (Signed)
Patient checking on progress, says pharmacy says they do not have it yet

## 2022-09-03 NOTE — Telephone Encounter (Signed)
Patient aware 1 year supply for Jardiance 10 mg sent to [facility] on [address].

## 2022-09-04 ENCOUNTER — Other Ambulatory Visit (HOSPITAL_COMMUNITY): Payer: Self-pay

## 2022-09-04 DIAGNOSIS — N1832 Chronic kidney disease, stage 3b: Secondary | ICD-10-CM | POA: Diagnosis not present

## 2022-09-04 DIAGNOSIS — N179 Acute kidney failure, unspecified: Secondary | ICD-10-CM | POA: Diagnosis not present

## 2022-09-04 DIAGNOSIS — I130 Hypertensive heart and chronic kidney disease with heart failure and stage 1 through stage 4 chronic kidney disease, or unspecified chronic kidney disease: Secondary | ICD-10-CM | POA: Diagnosis not present

## 2022-09-04 DIAGNOSIS — I5033 Acute on chronic diastolic (congestive) heart failure: Secondary | ICD-10-CM | POA: Diagnosis not present

## 2022-09-04 DIAGNOSIS — J9601 Acute respiratory failure with hypoxia: Secondary | ICD-10-CM | POA: Diagnosis not present

## 2022-09-06 NOTE — Telephone Encounter (Signed)
Pt is calling and PA  is needed [place], [address] Phone: [phone]  Fax: [phone]

## 2022-09-09 NOTE — Telephone Encounter (Signed)
Spoke with [facility], and patient.   Jardiance 10 mg ready for pick up.

## 2022-09-17 ENCOUNTER — Ambulatory Visit: Payer: Medicare Other | Attending: Cardiovascular Disease | Admitting: Cardiovascular Disease

## 2022-09-17 ENCOUNTER — Encounter: Payer: Self-pay | Admitting: Cardiovascular Disease

## 2022-09-17 VITALS — BP 134/70 | HR 60 | Ht 68.0 in | Wt 176.6 lb

## 2022-09-17 DIAGNOSIS — I10 Essential (primary) hypertension: Secondary | ICD-10-CM

## 2022-09-17 DIAGNOSIS — E785 Hyperlipidemia, unspecified: Secondary | ICD-10-CM | POA: Diagnosis not present

## 2022-09-17 DIAGNOSIS — I422 Other hypertrophic cardiomyopathy: Secondary | ICD-10-CM | POA: Diagnosis not present

## 2022-09-17 DIAGNOSIS — G4733 Obstructive sleep apnea (adult) (pediatric): Secondary | ICD-10-CM | POA: Diagnosis not present

## 2022-09-17 DIAGNOSIS — Z950 Presence of cardiac pacemaker: Secondary | ICD-10-CM

## 2022-09-17 DIAGNOSIS — I480 Paroxysmal atrial fibrillation: Secondary | ICD-10-CM

## 2022-09-17 DIAGNOSIS — E7841 Elevated Lipoprotein(a): Secondary | ICD-10-CM | POA: Diagnosis not present

## 2022-09-17 DIAGNOSIS — I5032 Chronic diastolic (congestive) heart failure: Secondary | ICD-10-CM | POA: Diagnosis not present

## 2022-09-17 DIAGNOSIS — N1832 Chronic kidney disease, stage 3b: Secondary | ICD-10-CM | POA: Diagnosis not present

## 2022-09-17 MED ORDER — HYDRALAZINE HCL 50 MG PO TABS: 75.0000 mg | ORAL_TABLET | Freq: Two times a day (BID) | ORAL | 3 refills | Status: DC

## 2022-09-17 NOTE — Progress Notes (Signed)
Cardiology Office Note    Date:  09/17/2022   ID:  Barbara [name], DOB 03-21-1941, MRN [id number]  PCP:  Laurey Morale, MD  Cardiologist:  Shelva Majestic, MD   6 month F/U follow-up evaluation  History of Present Illness:  Barbara [name] is a 81 y.o. female who has CAD and in November 2000 underwent stenting of a high-grade LAD stenosis with an [id number] 3.518 mm bare-metal stent. A nuclear perfusion study in November 2013 was unchanged from previously and continued to show normal perfusion without scar or ischemia. Ejection fraction was 68%. An echo Doppler study revealed an ejection fraction in the 55-60% range with grade 1 diastolic dysfunction. 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   Additional problems include hypertension as well as hyperlipidemia. Over the past several months she has noticed that she is more tired.  She also has noticed more shortness of breath with activity.  She has noted some vague indigestion symptoms.  She admits to some mild ankle swelling.  She has been taking Crestor 10 mg and denies myalgias.  She has been on losartan HCT 100/25 as well as amlodipine 10 mg and Lasix 20 mg for blood pressure and peripheral edema.   When I saw her in November 2015 her blood pressure was controlled.  She was bradycardic not on any rate control medication and I raise the possibility of a component of chronotropic incompetence.  To further evaluate her exertional dyspnea she underwent a nuclear perfusion study on 10/06/2014 which was normal.  Post stress ejection fraction was 67%.  An echo Doppler study done on 10/06/2014 showed an EF of 60-65% with moderate left ventricular hypertrophy.  There was grade 1 diastolic dysfunction.  She had indeterminate LV filling pressure.  There was mild aortic sclerosis without stenosis, mitral annular calcification with mild MR, and mild  dilatation of her left atrium with mild tricuspid regurgitation.  Pulmonary pressures were minimally elevated at 31 mm.   She underwent successful left knee surgery by Dr. Lindwood Qua in March 2016.  She tolerated surgery well from a cardiovascular standpoint.   An echo Doppler study in March 2017 showed mild LVH with vigorous LV function with an EF of 65-70%.  There was grade 2 diastolic dysfunction.  There was moderate aortic sclerosis without stenosis.  There was mild LA dilation.  PA pressures were upper normal.   When I saw her in May 2018, she had noticed issues with ankle swelling.  She had stopped taking furosemide since his had expired.  I elected to institute spironolactone with her moderate diastolic dysfunction.  She was taking amlodipine 10 mg, losartan HCT 100/25 mg and she has been on spironolactone 25 mg daily.     At her evaluation in August 2018 she was in sinus rhythm and had bilateral ankle and feet swelling.  I reduced her amlodipine to 5 mg and further titrated spironolactone to 25 mg twice a day.  Recently she has been feeling well.  She went to [place] over Christmas and did well.  However, since 11/05/2017 she noticed her heart rate being a little faster and subsequently felt some irregularity.  I  I saw her in the office on 11/10/2017 and she was in atrial flutter with variable block. Her blood pressure was elevated.  I started metoprolol 25 mg twice a day and started eliquis 5 mg twice a day.  An echo  Doppler study on November 17, 2017 showed an EF of 70-75% with grade 2 diastolic dysfunction. There was mild to moderate MR, moderately severe LA enlargement, and PA pressure was increased at 53 mm.  She was hospitalized and underwent Tikosyn loading with Dr. Rayann Heman, which ultimately pharmacologically cardioverted her back to sinus rhythm.  She was seen in follow-up in the atrial fibrillation clinic on 12/01/2017.  At that time, she was maintaining sinus rhythm and her QT interval  was stable.     I saw her on December 03, 2017 at which time she was doing well.  Subsequently she has seen Dr. Rayann Heman.  Has had issues with hoarseness and was referred to Dr. Redmond Baseman for ENT evaluation.  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  She is scheduled to undergo a CT of the soft tissue of her neck with contrast on January 20, 2018.  She was recently notified that her valsartan has a contaminant.  She denies any awareness of recurrent atrial fibrillation.  She denies significant swelling.  She has been maintained on furosemide 40 mg daily, losartan 50 mg for hypertension.  She denies bleeding on Eliquis 5 mg twice daily.  She continues to be on rosuvastatin for hyperlipidemia.  She is on Tikosyn 125 mcg twice a day.     I saw her in the office in March 2019, she has continued to do well.  Due to concerns for obstructive sleep apnea she was referred for a sleep study.  This was done in December 2019 revealed severe sleep apnea with an AHI at 37/h, with REM sleep AHI at 52/h in supine sleep AHI at 47/h.  She had significant oxygen desaturation to a nadir of 77%.  CPAP was instituted and she was titrated up to 11 cm.  She was started on CPAP therapy in 2020 with [facility] as her DME company.  A most recent download from April 7 through Mar 10, 2019 shows that she is meeting compliance standards with 90% of usage days.  She is averaging 6 hours and 42 minutes of usage per night.  At 10 cm water pressure AHI is excellent at 2.9.  There is no leak.  She now has nasal pillows, but previously it had a full facemask which she did not tolerate.   She developed episodes of chest pain and increasing shortness of breath leading to her hospitalization in April 2020.  Was mild troponin elevation and she had inferolateral T wave abnormalities.  She underwent cardiac catheterization.  The results are as shown below and she was found to have nonobstructive CAD with a widely patent previously placed  LAD stent and mild concomitant CAD.  She had LVH with a "Spade" like ventricle.   She was evaluated by me on Mar 11, 2019 in a telemedicine visit.  At that time she denied any chest pain but admitted to some shortness of breath.  She was sleeping better.  However she was still not sleeping long and often only averaging between 6 to 7 hours of sleep per night which resulted in some fatigue during the day.  She was unaware of breakthrough snoring.  A download was obtained which showed an AHI of 2.9 at 10 cm set pressure.  She was evaluated by Charyl Dancer in December 2020 in our office.  She continued to do well without chest pain.  Her blood pressure was elevated.  She was maintaining sinus rhythm on Tikosyn 125 mg twice a  day as well as Eliquis 5 mg twice a day.  ECG showed sinus bradycardia at 54 bpm.  I saw her Mar 31, 2020.  Prior to that evaluation she had undergone  shoulder surgery on Mar 23, 2020 the right reverse shoulder arthroplasty by Dr. Stann Mainland for advanced rotator cuff arthropathy and multiple tears.  His surgery well from a cardiac standpoint.  Since surgery she has been in the sling and during this time has not been able to use CPAP.  She will be having a follow-up orthopedic visit over the next several days and hopefully the sling will be taken off and CPAP can be reinstituted.  She denied any recent chest tightness or pressure.  She was unaware of palpitations.  I saw her in September 2021.  Over the last several months, she felt well.  She has gained mobility back to her right shoulder.  She again has resumed CPAP therapy.  A download was obtained from August 2 through July 04, 2020.  She had undergone an echo Doppler study which showed hyperdynamic LV function with ejection fraction at 70 to 75%.  There was mild aortic sclerosis without stenosis.  Estimated RV systolic pressure was mildly increased at 35.9.  She had apical hypertrophy with obliteration of LV cavity in systole and apex  suggestive for apical hypertrophic cardiomyopathy.  She was without chest pain or shortness of breath.   She was hospitalized in December 2021 with atrial fibrillation with RVR.  She was restarted on Eliquis.  She was felt to have demand ischemia.  Tikosyn was restarted.  She was seen by EP who recommended she wear a Zio patch which revealed frequent PACs, nonsustained atrial tachycardia and a disorganized atrial activity without recurrent atrial fibrillation.  She was seen by Jory Sims, NP in January 2022.  She was caring for her husband and was not using her CPAP.  I saw her in March 2022 at which time she remained stable.  She denied any chest pain or awareness of recurrent atrial fibrillation.  She was inconsistent with CPAP use.  Her husband was ill and was hospitalized and often times she was caring for her husband between 1 AM and 5 AM prior to his hospitalization the day prior to her evaluation with me.  At 10 cm water pressure AHI was 2.1 with average use at 6 hours and 22 minutes.  She continues to be on Eliquis for anticoagulation without bleeding.  Her blood pressure remained stable.  She continued to be on rosuvastatin 40 mg with LDL at 50 in December 2021. She denies any chest pain or awareness of recurrent A. fib.  Her husband was apparently hospitalized yesterday.  She has not been consistently using CPAP for long duration since oftentimes she was caring for her husband from 1 AM to 5 AM prior to his hospitalization.  However we did obtain a download in the office from February 14 through January 16, 2021.  Usage was 70% with average usage 6 hours and 22 minutes.  At a 10 cm set pressure, AHI was 2.1.  She has continued to be on amlodipine 5 mg, candesartan 16 mg, and spironolactone 25 mg for hypertension.  She is tolerating Tikosyn 125 mg twice a day.  She was on rosuvastatin for hyperlipidemia.   I saw her on August 08, 2021 at which time she remained cardiac stable.  She recently was  found to have a urinary tract infection has been on antibiotics by Dr. Alysia Penna.  She continues to use CPAP therapy but over the past month she had reduced compliance.  With average use just under 6 hours per night.  AHI is 1.4.  She had laboratory by her primary physician in August 2022.  Creatinine was 1.60.  She denies any chest pain, awareness of recurrent atrial fibrillation, or leg swelling.    I last saw her on Mar 13, 2022.  Since I last saw her, unfortunately her husband died on 10-Dec-2021.  She has noticed blood pressure elevation.  She also admits to some mild ankle swelling.  She denies any chest pain.  She has been using CPAP and typically goes to bed around 1130 and wakes up at 8 AM.  A download was obtained from April 10 through Mar 12, 2022.  She was averaging 6 hours and 52 minutes.  Usage, however was reduced at only 63% due to sinus congestion issues.Marland Kitchen  AHI was 2.3.    Since I saw her, she has had several evaluations and ultimately was hospitalized in July and underwent cardiac catheterization on May 06, 2022 by Dr. Saunders Revel which showed a patent proximal to mid LAD stent with 30% mild in-stent restenosis.  There was mild to moderate concomitant nonobstructive disease.  LVEDP was 5 mm.  She had developed sick sinus syndrome leading to her hospitalization and on July 5 underwent permanent pacemaker insertion by Dr. Curt Bears successfully.   She underwent an echo Doppler study May 26, 2022 which showed an EF of 75%, normal RV function, moderate MR.    She presented to the emergency room on October 1 with shortness of breath and was found to be in acute hypoxic respiratory failure in the setting of bronchitis and had missed several days of Lasix prior to admission.  She was treated with a 5-day course of antibiotics and nebulized therapy.  During her hospitalization she was started on Jardiance given Lasix for refills.  Subsequently, she was evaluated in heart failure clinic by Ellen Henri, PA-C.  During her evaluation, she was euvolemic on exam.  She was on Jardiance 10 mg, Lasix 20 mg, Toprol-XL 50 mg daily.  She also is awaiting genetic testing with her diagnosis of LV apical hypertrophy with patchy LGE at apex and at RV insertion site consistent with apical hypertrophic cardiomyopathy noted on CT MRI in 2021.  Presently, she feels well.  She is on a regimen of amiodarone 200 mg daily, Eliquis 2.5 mg twice a day and ECG today shows atrially paced rhythm at 60 with prolonged AV conduction at 2 94 ms.  She has previously noted T wave inversion.  She continues to be on Jardiance 10 mg, furosemide 20 mg, metoprolol succinate 50 mg, isosorbide 30 mg and has difficulty taking hydralazine which had been scheduled at 50 mg 3 times a day and often many days she only takes 50 twice a day.  She continues to be on rosuvastatin for hyperlipidemia.  She is on pantoprazole for GERD.  She has continued to use CPAP therapy.  A download was obtained from October 15 through September 16, 2022.  Compliance is excellent with average use at 8 hours and 8 minutes.  However, her CPAP unit is set at a pressure of 10 cm.  AHI is elevated at 14.3 with an obstructive apnea index of 10.4 and a central apnea index of 2.9.  She presents for follow-up evaluation.  Past Medical History:  Diagnosis Date   A-fib [facility])    Allergy  CAD (coronary artery disease)    sees Dr. Shelva Majestic  cardiac stents - 2000   CHF (congestive heart failure) ([place])    Colon polyps    Complication of anesthesia    rash/hives with "caines"   Dyspnea    02/12/18 " when my heart gets out of rhythm, it has not been out of rhythym- since I have been on Tikosyn (11/2017)   Dysrhythmia    afib fib   GERD (gastroesophageal reflux disease)    takes OTC- Omeprazole- prn   Heart murmur    History of kidney stones    History of stress test    show normal perfusion without scar or ischemia, post EF 68%   Hx of echocardiogram    show an  EF 55%-60% range with grade 1 diastolic dysfunction, she had mitral anular calcification with mild MR, moderate LA dilation and mild pulmonary hypertension with a PA estimated pressure of 54m   Hyperlipidemia    Hypertension    [place]    Osteoarthritis    Pacemaker    Pneumonia    hx of 2015    PONV (postoperative nausea and vomiting)    Sleep apnea     Past Surgical History:  Procedure Laterality Date   CARDIAC CATHETERIZATION     11/2017   cardiac stents  2000   COLONOSCOPY  01-05-14   per Dr. JTeena Irani clear, no repeats needed    COLONOSCOPY     CORONARY ST[place] PLACEMENT  2000   [place] PLACEMENT Left 06/11/2022   Procedure: CYSTOSCOPY WITH RETROGRADE PYELOGRAM, LEFT STENT PLACEMENT;  Surgeon: BLucas Mallow MD;  Location: [facility];  Service: Urology;  Laterality: Left;   CYSTOSCOPY/URETEROSCOPY/HOLMIUM LASER/STENT PLACEMENT Left 06/25/2022   Procedure: CYSTOSCOPY LEFT URETEROSCOPY/HOLMIUM LASER/STENT PLACEMENT;  Surgeon: WIrine Seal MD;  Location: [facility];  Service: Urology;  Laterality: Left;  1 HR FOR THIS CASE   DIRECT LARYNGOSCOPY WITH RADIAESSE INJECTION N/A 02/13/2018   Procedure: DIRECT LARYNGOSCOPY WITH RADIAESSE INJECTION;  Surgeon: BMelida Quitter MD;  [place]  Service: ENT;  Laterality: N/A;   EYE SURGERY Left    CATARACT REMOVAL   KNEE ARTHROSCOPY Left 01/06/2015   Procedure: LEFT KNEE ARTHROSCOPY, abrasion chondroplasty of the medial femerol condryl,medial and lateral menisectomy, microfracture , synovectomy of the suprpatellar pouch;  Surgeon: RLatanya Maudlin MD;  Location: [facility];  Service: Orthopedics;  Laterality: Left;   LEFT HEART CATH AND CORONARY ANGIOGRAPHY N/A 02/26/2019   Procedure: LEFT HEART CATH AND CORONARY ANGIOGRAPHY;  Surgeon: KTroy Sine MD;  [facility];  Service: Cardiovascular;  Laterality: N/A;   LEFT HEART CATH  AND CORONARY ANGIOGRAPHY N/A 05/06/2022   Procedure: LEFT HEART CATH AND CORONARY ANGIOGRAPHY;  Surgeon: ENelva Bush MD;  [facility];  Service: Cardiovascular;  Laterality: N/A;   MICROLARYNGOSCOPY W/VOCAL CORD INJECTION N/A 08/07/2018   Procedure: MICROLARYNGOSCOPY WITH VOCAL CORD INJECTION OF PROLARYN;  Surgeon: BMelida Quitter MD;  [place] [facility]  Service: ENT;  Laterality: N/A;  JET VENTILATION   PACEMAKER IMPLANT N/A 05/08/2022   Procedure: PACEMAKER IMPLANT;  Surgeon: CConstance Haw MD;  [facility];  Service: Cardiovascular;  Laterality: N/A;   REVERSE SHOULDER ARTHROPLASTY Right 03/23/2020   Procedure: REVERSE SHOULDER ARTHROPLASTY;  Surgeon: RNicholes Stairs MD;  [place]  Service: Orthopedics;  Laterality: Right;   VAGINAL HYSTERECTOMY  1971    Current Medications: Outpatient Medications Prior to Visit  Medication Sig Dispense Refill   acetaminophen (TYLENOL) 500 MG tablet Take 500 mg by mouth daily as needed for headache.     amiodarone (PACERONE) 200 MG tablet Take 1 tablet (200 mg total) by mouth daily. 90 tablet 3   Ascorbic Acid (VITAMIN C) 1000 MG tablet Take 1,000 mg by mouth daily.     aspirin 81 MG chewable tablet Chew 1 tablet (81 mg total) by mouth daily. 30 tablet 0   Cholecalciferol (VITAMIN D3) 2000 units TABS Take 2,000 Units by mouth daily.     dextromethorphan (DELSYM) 30 MG/5ML liquid Take 15 mg by mouth daily as needed for cough.     docusate sodium (COLACE) 100 MG capsule Take 100 mg by mouth as needed for mild constipation.     ELIQUIS 2.5 MG TABS tablet TAKE 1 TABLET BY MOUTH TWICE  DAILY 160 tablet 3   empagliflozin (JARDIANCE) 10 MG TABS tablet Take 1 tablet (10 mg total) by mouth daily. 90 tablet 3   furosemide (LASIX) 20 MG tablet Take 1 tablet (20 mg total) by mouth daily. 30 tablet 0   ipratropium (ATROVENT) 0.06 % nasal spray Place 2 sprays into both nostrils 2 (two) times daily as needed  (allergies).  5   isosorbide mononitrate (IMDUR) 30 MG 24 hr tablet Take 1 tablet (30 mg total) by mouth daily. 90 tablet 3   ketoconazole (NIZORAL) 2 % cream Apply 1 Application topically daily as needed for irritation (for rash under breast).     loratadine (CLARITIN) 10 MG tablet Take 10 mg by mouth daily as needed for allergies.     LORazepam (ATIVAN) 0.5 MG tablet TAKE 1 TABLET BY MOUTH EVERY 6 HOURS AS NEEDED FOR ANXIETY 60 tablet 5   metoprolol succinate (TOPROL-XL) 50 MG 24 hr tablet Take 1 tablet (50 mg total) by mouth daily. 90 tablet 2   Multiple Vitamin (MULTIVITAMIN WITH MINERALS) TABS tablet Take 1 tablet by mouth daily.     nitroGLYCERIN (NITROSTAT) 0.4 MG SL tablet DISSOLVE ONE TABLET UNDER THE TONGUE EVERY 5 MINUTES AS NEEDED FOR CHEST PAIN.  DO NOT EXCEED A TOTAL OF 3 DOSES IN 15 MINUTES 25 tablet 3   pantoprazole (PROTONIX) 40 MG tablet Take 1 tablet (40 mg total) by mouth daily. 30 tablet 10   polyethylene glycol powder (GLYCOLAX/MIRALAX) 17 GM/SCOOP powder Take 17 g by mouth daily. 238 g 0   potassium chloride (KLOR-CON) 10 MEQ tablet Take 1 tablet (10 mEq total) by mouth daily. 90 tablet 3   PRESCRIPTION MEDICATION See admin instructions. CPAP- At bedtime     Probiotic Product (PROBIOTIC GUMMIES PO) Take 2 tablets by mouth every morning.     rosuvastatin (CRESTOR) 40 MG tablet TAKE 1 TABLET BY MOUTH DAILY 100 tablet 2   triamcinolone (NASACORT) 55 MCG/ACT nasal inhaler Place 1 spray into both nostrils daily as needed (for allergies).      hydrALAZINE (APRESOLINE) 50 MG tablet Take 1 tablet (50 mg total) by mouth every 8 (eight) hours. 90 tablet 5   No facility-administered medications prior to visit.     Allergies:   Lidocaine, Morphine, Procaine hcl, Sulfonamide derivatives, Amlodipine, Norco [hydrocodone-acetaminophen], Tramadol, Vytorin [ezetimibe-simvastatin], and Tape   Social History   Socioeconomic History   Marital status: Widowed    Spouse name: Not on file    Number of children: 2   Years of education: Not on file   Highest  education level: High school graduate  Occupational History   Occupation: Retired  Tobacco Use   Smoking status: Former    Years: 40.00    Types: Cigarettes    Quit date: 11/04/1996    Years since quitting: 25.8   Smokeless tobacco: Never  Vaping Use   Vaping Use: Never used  Substance and Sexual Activity   Alcohol use: No    Alcohol/week: 0.0 standard drinks of alcohol   Drug use: No   Sexual activity: Not Currently  Other Topics Concern   Not on file  Social History Narrative   Not on file   Social Determinants of Health   Financial Resource Strain: Low Risk  (08/05/2022)   Overall Financial Resource Strain (CARDIA)    Difficulty of Paying Living Expenses: Not hard at all  Food Insecurity: No Food Insecurity (08/05/2022)   Hunger Vital Sign    Worried About Running Out of Food in the Last Year: Never true    [place] in the Last Year: Never true  Transportation Needs: No Transportation Needs (08/05/2022)   PRAPARE - Hydrologist (Medical): No    Lack of Transportation (Non-Medical): No  Physical Activity: Insufficiently Active (07/02/2022)   Exercise Vital Sign    Days of Exercise per Week: 1 day    Minutes of Exercise per Session: 20 min  Stress: No Stress Concern Present (07/02/2022)   [place]    Feeling of Stress : Not at all  Social Connections: Moderately Integrated (07/02/2022)   Social Connection and Isolation Panel [NHANES]    Frequency of Communication with Friends and Family: More than three times a week    Frequency of Social Gatherings with Friends and Family: More than three times a week    Attends Religious Services: More than 4 times per year    Active Member of [employer] or Organizations: Yes    Attends Archivist Meetings: More than 4 times per year    Marital Status: Widowed      Family History:  The patient'sfamily history includes Cancer in her maternal grandmother; Heart disease in her maternal grandfather.   ROS General: Negative; No fevers, chills, or night sweats;  HEENT: Negative; No changes in vision or hearing, sinus congestion, difficulty swallowing Pulmonary: Negative; No cough, wheezing, shortness of breath, hemoptysis Cardiovascular: See HPI GI: Negative; No nausea, vomiting, diarrhea, or abdominal pain GU: Negative; No dysuria, hematuria, or difficulty voiding Musculoskeletal: Right shoulder surgery May 2021 Hematologic/Oncology: Negative; no easy bruising, bleeding Endocrine: Negative; no heat/cold intolerance; no diabetes Neuro: Negative; no changes in balance, headaches Skin: Negative; No rashes or skin lesions Psychiatric: Negative; No behavioral problems, depression Sleep: Negative; No snoring, daytime sleepiness, hypersomnolence, bruxism, restless legs, hypnogognic hallucinations, no cataplexy Other comprehensive 14 point system review is negative.   PHYSICAL EXAM:   VS:  BP 134/70   Pulse 60   Ht _0  (1.727 m)   Wt 176 lb 9.6 oz (80.1 kg)   SpO2 98%   BMI 26.85 kg/m    Repeat blood pressure by me was elevated at 142/80  Wt Readings from Last 3 Encounters:  09/17/22 176 lb 9.6 oz (80.1 kg)  08/14/22 173 lb 3.2 oz (78.6 kg)  08/13/22 171 lb 2 oz (77.6 kg)    General: Alert, oriented, no distress.  Skin: normal turgor, no rashes, warm and dry HEENT: Normocephalic, atraumatic. Pupils equal round and reactive to  light; sclera anicteric; extraocular muscles intact;  Nose without nasal septal hypertrophy Mouth/Parynx benign; Mallinpatti scale 3 Neck: No JVD, no carotid bruits; normal carotid upstroke Lungs: clear to ausculatation and percussion; no wheezing or rales Chest wall: without tenderness to palpitation Heart: PMI not displaced, RRR, s1 s2 normal, 1/6 systolic murmur, no diastolic murmur, no rubs, gallops, thrills, or  heaves Abdomen: soft, nontender; no hepatosplenomehaly, BS+; abdominal aorta nontender and not dilated by palpation. Back: no CVA tenderness Pulses 2+ Musculoskeletal: full range of motion, normal strength, no joint deformities Extremities: no clubbing cyanosis or edema, Homan's sign negative  Neurologic: grossly nonfocal; Cranial nerves grossly wnl Psychologic: Normal mood and affect     Studies/Labs Reviewed:   September 17, 2022 ECG (independently read by me):  Atrial paced at 60, prolonged AV conduction, PR 294 msec, T wave abnormality  May 10, 2023ECG (independently read by me):  Sinus bradycardia at 47, LVH, T wave abbnormality  August 08, 2021 ECG (independently read by me): Sinus bradycardia at 56; LVH with repolarization, QTc 428 msec  January 17, 2021 ECG (independently read by me): Sinus bradycardia at 56, LVH with prior T wave abnormalities; QTc 441 msec  September 2021 ECG (independently read by me): Sinus bradycardia at 54; LVHwih T wave abnormality inferolaterally  I personally reviewed the ECG from October 26, 2019 which showed sinus bradycardia at 54 bpm, LVH with repolarization abnormality.  QTc interval was 398 ms.  Recent Labs:    Latest Ref Rng & Units 08/13/2022    2:48 PM 08/08/2022    5:47 AM 08/07/2022    2:10 AM  BMP  Glucose 70 - 99 mg/dL 115  99  101   BUN 6 - 23 mg/dL 25  37  27   Creatinine 0.40 - 1.20 mg/dL 1.96  2.02  1.88   Sodium 135 - 145 mEq/L 139  138  140   Potassium 3.5 - 5.1 mEq/L 3.4  4.4  3.8   Chloride 96 - 112 mEq/L 103  104  100   CO2 19 - 32 mEq/L _0 Calcium 8.4 - 10.5 mg/dL 10.4  9.8  10.2         Latest Ref Rng & Units 08/04/2022    4:32 PM 06/12/2022    3:48 AM 06/11/2022    3:13 AM  Hepatic Function  Total Protein 6.5 - 8.1 g/dL 7.3  7.4  6.9   Albumin 3.5 - 5.0 g/dL 4.0  3.5  3.6   AST 15 - 41 U/L _1 ALT 0 - 44 U/L _2 Alk Phosphatase 38 - 126 U/L 74  67  64   Total Bilirubin 0.3 - 1.2 mg/dL  1.2  0.8  1.1   Bilirubin, Direct 0.0 - 0.2 mg/dL 0.2          Latest Ref Rng & Units 08/07/2022    2:10 AM 08/06/2022    1:53 AM 08/04/2022    1:48 PM  CBC  WBC 4.0 - 10.5 K/uL 5.8  8.6  9.6   Hemoglobin 12.0 - 15.0 g/dL 11.9  10.9  10.3   Hematocrit 36.0 - 46.0 % 35.5  32.5  32.5   Platelets 150 - 400 K/uL 197  162  160    Lab Results  Component Value Date   MCV 85.5 08/07/2022   MCV 85.5 08/06/2022   MCV 86.4 08/04/2022   Lab Results  Component Value Date   TSH 2.630 06/24/2022   Lab Results  Component Value Date   HGBA1C 6.1 06/13/2021     BNP    Component Value Date/Time   BNP 1,908.9 (H) 08/04/2022 1621    ProBNP No results found for: "PROBNP"   Lipid Panel     Component Value Date/Time   CHOL 148 06/13/2021 1459   TRIG 244.0 (H) 06/13/2021 1459   HDL 57.60 06/13/2021 1459   CHOLHDL 3 06/13/2021 1459   VLDL 48.8 (H) 06/13/2021 1459   LDLCALC 50 10/08/2020 0313   LDLDIRECT 54.0 06/13/2021 1459     RADIOLOGY: No results found.   Additional studies/ records that were reviewed today include:    Sleep Study IMPRESSIONS: 10/07/2018 - Severe obstructive sleep apnea occurred during the diagnostic portion of the study (AHI 36.9/h; RDI 45.1/h); events were worse with supine position (AHI 46.8/h)  and during REM sleep (AHI 52.2/h).  CPAP was initiated at 5 cm and was titrated to optimal PAP pressure of 11 cm of water. - No significant central sleep apnea occurred during the diagnostic portion of the study (CAI = 0.0/hour). - Severe oxygen desaturation during the diagnostic portion of the study to a nadir of 77%. - The patient snored with loud snoring volume during the diagnostic portion of the study. - No cardiac abnormalities were noted during this study. - Clinically significant periodic limb movements did not occur during sleep.   DIAGNOSIS - Obstructive Sleep Apnea (327.23 [G47.33 ICD-10])   RECOMMENDATIONS - Trial of CPAP therapy with EPR at 11 cm  H2O with heated humidification. A Medium size Resmed Full Face Mask AirFit 20 for Her mask was used for the titration.  - Effort should be made to optimize nasal and oropharyngeal patency. - Avoid alcohol, sedatives and other CNS depressants that may worsen sleep apnea and disrupt normal sleep architecture. - Sleep hygiene should be reviewed to assess factors that may improve sleep quality. - Weight management and regular exercise should be initiated or continued. - Recommend a download in 30 days and sleep clinic evaluation after 4 weeks of therapy.   [Electronically signed] 10/18/2018 04:25 PM   Cath: 02/26/19   Prox RCA to Mid RCA lesion is 15% stenosed. Ost LAD to Prox LAD lesion is 20% stenosed. Prox Cx lesion is 20% stenosed.   Mild non-obstructive coronary artery disease with a patent proximal LAD stent with mild 20% intimal hyperplasia (inserted  09/1999); 20% proximal left circumflex narrowing, and mild irregularity of 10 to 15% in the mid RCA.   Hyperdynamic LV function with an "Ace of Spade "configuration and near cavity obliteration in the mid to apical segment during systole.  There is evidence for left ventricular hypertrophy.  LVEDP is 16 mm.   RECOMMENDATION: Medical therapy with optimal blood pressure control, lipid management, and resumption of Eliquis tomorrow   CATH: 05/06/2022 Conclusions: Mild-moderate, non-obstructive coronary artery disease. Patent proximal/mid LAD stent with up to 30% in-stent restenosis. Low left ventricular filling pressure (LVEDP ~5 mmHg).   Recommendations: Continue secondary prevention of coronary artery disease. Proceed with pacemaker placement per Dr. Caryl Comes.    Echo: 05/04/2022 IMPRESSIONS   1. Left ventricular ejection fraction, by estimation, is >75%. The left  ventricle has hyperdynamic function. The left ventricle has no regional  wall motion abnormalities. Left ventricular diastolic parameters are  indeterminate. Elevated left  atrial  pressure.   2. Right ventricular systolic function is normal. The right ventricular  size is normal. There is  mildly elevated pulmonary artery systolic  pressure.   3. Left atrial size was moderately dilated.   4. The mitral valve is normal in structure. Moderate mitral valve  regurgitation. No evidence of mitral stenosis.   5. The aortic valve is tricuspid. Aortic valve regurgitation is not  visualized. Aortic valve sclerosis is present, with no evidence of aortic  valve stenosis.   6. The inferior vena cava is normal in size with greater than 50%  respiratory variability, suggesting right atrial pressure of 3 mmHg.    ASSESSMENT:    1. Essential hypertension   2. Apical variant hypertrophic cardiomyopathy ([place])   3. Chronic diastolic heart failure (HCC)   4. Paroxysmal atrial fibrillation ([place])   5. Primary hypertension   6. Cardiac pacemaker in situ   7. Stage 3b chronic kidney disease ([place])   8. OSA on CPAP   9. Hyperlipidemia LDL goal <70   10. Elevated Lp(a)     PLAN:  1.  CAD: Cardiac catheterization from February 26, 2020 demonstrated patent LAD stent with mild nonobstructive CAD involving her circumflex and RCA.  Cardiac catheterization on May 06, 2022 showed a patent LAD stent with 30% in-stent restenosis and mild nonobstructive concomitant CAD.  Currently she is chest pain-free.  2.  Exertional dyspnea/apical hypertrophy: She has a history of exertional dyspnea and has documented LVH.  She has been demonstrated to have a  "spade-like ventricle" with near cavity obliteration in the mid to apical segment of her myocardium during systole.  There is evidence for diastolic dysfunction.  Echo revealed hyperdynamic LV function with EF at 70 to 33%, grade 2 diastolic dysfunction, and apical hypertrophy with obliteration of her LV cavity in systole at apex given the appearance of her "spade-like ventricle."  She was felt most likely to have apical hypertrophy which subsequently  was confirmed on cardiac MRI with patchy late gadolinium enhancement at apex and RV insertion site.  LGE accounted for 2% of total myocardial mass.  She has hyperdynamic LV and RV function.  She is scheduled to undergo genetic testing in January 2024.  3. Obstructive sleep apnea: Her initial sleep study from December 2019 showed severe sleep apnea with AHI 36.9, R DI 45.1, rim AHI 52.2 in supine sleep AHI 46.8.  At her office visit in May 2023 a download shows average use at 6 hours 52 minutes.  At 10 cm water pressure AHI is 2.3.  Recent compliance has been reduced as far as usage days due to sinus issues for which she has been evaluated by Dr. Herbert Deaner.  I again discussed optimal sleep duration at 7 and 9 hours and potential increased risk for recurrent atrial fibrillation with untreated sleep apnea.  I obtained a new download in the office today.  At her set pressure of 10 cm, AHI is increased at 14.3 with 10.4 obstructive apneic events and 2.9 central apneic events.  I will change her from a set pressure of 10 cm to an auto mode of 9 to 16 cm of water.  I will increase her ramp start pressure for from 4 TO 7 CM.  4.  PAF: She had developed recurrent atrial fibrillation leading to hospitalization and was felt to have sick sinus syndrome.  She currently continues to be on amiodarone 200 mg daily and is on Eliquis for anticoagulation.  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  5.  CKD: Most recent laboratory shows creatinine 1.96 on August 13, 2022.  She is no longer on spironolactone.  She is on Jardiance 10 mg, furosemide 20 mg isosorbide, and has been on hydralazine.  6.  Eliquis anticoagulation: Well-tolerated without bleeding.  With her age and creatinine 1.5, her dose had been reduced to 2.5 twice daily.  7.  Essential hypertension: Since her prior evaluation with me, she is no longer on spironolactone or amlodipine or candesartan with her CKD.   She is now on Jardiance 10 mg, furosemide 20 mg, metoprolol succinate 50 mg daily and isosorbide 30 mg.  She is supposed to be taking hydralazine 50 mg 3 times a day.  Her blood pressure is elevated today.  Since she has only been taking hydralazine twice a day I am changing her dose to 75 mg twice a day.    7.  Hyperlipidemia: She continues to be on rosuvastatin 40 mg.  LDL cholesterol in December 2021 was 50 and direct LDL on June 13, 2021 was 54.  LP(a) in July 2023 was elevated at 206.4.  I will see her in 6 months for follow-up evaluation.  Medication Adjustments/Labs and Tests Ordered: Current medicines are reviewed at length with the patient today.  Concerns regarding medicines are outlined above.  Medication changes, Labs and Tests ordered today are listed in the Patient Instructions below. Patient Instructions  Medication Instructions:  Your physician has recommended you make the following change in your medication: INCREASE: Hydralazine 88m twice daily.  *If you need a refill on your cardiac medications before your next appointment, please call your pharmacy*   Lab Work: NONE If you have labs (blood work) drawn today and your tests are completely normal, you will receive your results only by: [place](if you have MyChart) OR A paper copy in the mail If you have any lab test that is abnormal or we need to change your treatment, we will call you to review the results.   Testing/Procedures: NONE   Follow-Up: At [facility] you and your health needs are our priority.  As part of our continuing mission to provide you with exceptional heart care, we have created designated Provider Care Teams.  These Care Teams include your primary Cardiologist (physician) and Advanced Practice Providers (APPs -  Physician Assistants and Nurse Practitioners) who all work together to provide you with the care you need, when you need it.  We recommend signing up for the patient  portal called "MyChart".  Sign up information is provided on this After Visit Summary.  MyChart is used to connect with patients for Virtual Visits (Telemedicine).  Patients are able to view lab/test results, encounter notes, upcoming appointments, etc.  Non-urgent messages can be sent to your provider as well.   To learn more about what you can do with MyChart, go to [email or web address]    Your next appointment:   6 month(s)  The format for your next appointment:   In Person  Provider:   TShelva Majestic MD     Signed, TShelva Majestic MD  09/17/2022 11:34 AM    [id number] [place]  [id number]: [phone]

## 2022-09-17 NOTE — Patient Instructions (Signed)
Medication Instructions:  Your physician has recommended you make the following change in your medication: INCREASE: Hydralazine '75mg'$  twice daily.  *If you need a refill on your cardiac medications before your next appointment, please call your pharmacy*   Lab Work: NONE If you have labs (blood work) drawn today and your tests are completely normal, you will receive your results only by: [place] (if you have MyChart) OR A paper copy in the mail If you have any lab test that is abnormal or we need to change your treatment, we will call you to review the results.   Testing/Procedures: NONE   Follow-Up: At [facility], you and your health needs are our priority.  As part of our continuing mission to provide you with exceptional heart care, we have created designated Provider Care Teams.  These Care Teams include your primary Cardiologist (physician) and Advanced Practice Providers (APPs -  Physician Assistants and Nurse Practitioners) who all work together to provide you with the care you need, when you need it.  We recommend signing up for the patient portal called "MyChart".  Sign up information is provided on this After Visit Summary.  MyChart is used to connect with patients for Virtual Visits (Telemedicine).  Patients are able to view lab/test results, encounter notes, upcoming appointments, etc.  Non-urgent messages can be sent to your provider as well.   To learn more about what you can do with MyChart, go to [email or web address].    Your next appointment:   6 month(s)  The format for your next appointment:   In Person  Provider:   Shelva Majestic, MD

## 2022-09-18 ENCOUNTER — Ambulatory Visit: Payer: Medicare Other | Attending: Cardiology | Admitting: Cardiology

## 2022-09-18 ENCOUNTER — Encounter: Payer: Self-pay | Admitting: Cardiology

## 2022-09-18 VITALS — BP 134/68 | HR 60 | Ht 68.0 in | Wt 177.8 lb

## 2022-09-18 DIAGNOSIS — I495 Sick sinus syndrome: Secondary | ICD-10-CM

## 2022-09-18 DIAGNOSIS — I480 Paroxysmal atrial fibrillation: Secondary | ICD-10-CM

## 2022-09-18 DIAGNOSIS — D6869 Other thrombophilia: Secondary | ICD-10-CM | POA: Diagnosis not present

## 2022-09-18 DIAGNOSIS — R001 Bradycardia, unspecified: Secondary | ICD-10-CM

## 2022-09-18 LAB — CUP PACEART INCLINIC DEVICE CHECK
Battery Remaining Longevity: 145 mo
Battery Voltage: 3.18 V
Brady Statistic AP VP Percent: 0.04 %
Brady Statistic AP VS Percent: 97.84 %
Brady Statistic AS VP Percent: 0 %
Brady Statistic AS VS Percent: 2.11 %
Brady Statistic RA Percent Paced: 97.88 %
Brady Statistic RV Percent Paced: 0.04 %
Date Time Interrogation Session: 20231115142927
Implantable Lead Connection Status: 753985
Implantable Lead Connection Status: 753985
Implantable Lead Implant Date: 20230705
Implantable Lead Implant Date: 20230705
Implantable Lead Location: 753859
Implantable Lead Location: 753860
Implantable Lead Model: 3830
Implantable Lead Model: 5076
Implantable Pulse Generator Implant Date: 20230705
Lead Channel Impedance Value: 323 Ohm
Lead Channel Impedance Value: 342 Ohm
Lead Channel Impedance Value: 513 Ohm
Lead Channel Impedance Value: 513 Ohm
Lead Channel Pacing Threshold Amplitude: 1.25 V
Lead Channel Pacing Threshold Amplitude: 1.25 V
Lead Channel Pacing Threshold Pulse Width: 0.4 ms
Lead Channel Pacing Threshold Pulse Width: 0.4 ms
Lead Channel Sensing Intrinsic Amplitude: 1.375 mV
Lead Channel Sensing Intrinsic Amplitude: 1.375 mV
Lead Channel Sensing Intrinsic Amplitude: 13.625 mV
Lead Channel Sensing Intrinsic Amplitude: 15.25 mV
Lead Channel Setting Pacing Amplitude: 2.5 V
Lead Channel Setting Pacing Amplitude: 2.5 V
Lead Channel Setting Pacing Pulse Width: 0.4 ms
Lead Channel Setting Sensing Sensitivity: 0.9 mV
Zone Setting Status: 755011
Zone Setting Status: 755011

## 2022-09-18 NOTE — Progress Notes (Signed)
Electrophysiology Office Note   Date:  09/18/2022   ID:  Barbara [name], DOB 1941-03-24, MRN [id number]  PCP:  Laurey Morale, MD  Cardiologist:  Claiborne Billings Primary Electrophysiologist:  Kaydan Wong Meredith Leeds, MD    Chief Complaint: pacemaker   History of Present Illness: Barbara [name] is a 81 y.o. female who is being seen today for the evaluation of pacemaker at the request of Laurey Morale, MD. Presenting today for electrophysiology evaluation.  She has a history of atrial fibrillation, coronary artery disease, hypertension, hyperlipidemia, sleep apnea.  She presented to the hospital July 2023 with atrial fibrillation.  She was given metoprolol in the emergency room and converted to sinus rhythm with junctional bradycardia.  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  He is currently on amiodarone for her atrial fibrillation.  Today, she denies symptoms of palpitations, chest pain, shortness of breath, orthopnea, PND, lower extremity edema, claudication, dizziness, presyncope, syncope, bleeding, or neurologic sequela. The patient is tolerating medications without difficulties.    Past Medical History:  Diagnosis Date   A-fib [facility])    Allergy    CAD (coronary artery disease)    sees Dr. Shelva Majestic  cardiac stents - 2000   CHF (congestive heart failure) ([place])    Colon polyps    Complication of anesthesia    rash/hives with "caines"   Dyspnea    02/12/18 " when my heart gets out of rhythm, it has not been out of rhythym- since I have been on Tikosyn (11/2017)   Dysrhythmia    afib fib   GERD (gastroesophageal reflux disease)    takes OTC- Omeprazole- prn   Heart murmur    History of kidney stones    History of stress test    show normal perfusion without scar or ischemia, post EF 68%   Hx of echocardiogram    show an EF 55%-60% range with grade 1 diastolic dysfunction, she had mitral anular calcification  with mild MR, moderate LA dilation and mild pulmonary hypertension with a PA estimated pressure of 30m   Hyperlipidemia    Hypertension    NSTEMI (non-ST elevated myocardial infarction) ([place]    Osteoarthritis    Pacemaker    Pneumonia    hx of 2015    PONV (postoperative nausea and vomiting)    Sleep apnea    Past Surgical History:  Procedure Laterality Date   CARDIAC CATHETERIZATION     11/2017   cardiac stents  2000   COLONOSCOPY  01-05-14   per Dr. JTeena Irani clear, no repeats needed    COLONOSCOPY     CORONARY STENT PLACEMENT  2000   [place]RETEROSCOPY AND STENT PLACEMENT Left 06/11/2022   Procedure: CYSTOSCOPY WITH RETROGRADE PYELOGRAM, LEFT STENT PLACEMENT;  Surgeon: BLucas Mallow MD;  Location: [facility];  Service: Urology;  Laterality: Left;   CYSTOSCOPY/URETEROSCOPY/HOLMIUM LASER/STENT PLACEMENT Left 06/25/2022   Procedure: CYSTOSCOPY LEFT URETEROSCOPY/HOLMIUM LASER/STENT PLACEMENT;  Surgeon: WIrine Seal MD;  Location: [facility];  Service: Urology;  Laterality: Left;  1 HR FOR THIS CASE   DIRECT LARYNGOSCOPY WITH RADIAESSE INJECTION N/A 02/13/2018   Procedure: DIRECT LARYNGOSCOPY WITH RADIAESSE INJECTION;  Surgeon: BMelida Quitter MD;  [place]  Service: ENT;  Laterality: N/A;   EYE SURGERY Left    CATARACT REMOVAL   KNEE ARTHROSCOPY Left 01/06/2015   Procedure: LEFT KNEE ARTHROSCOPY, abrasion chondroplasty of the medial femerol  condryl,medial and lateral menisectomy, microfracture , synovectomy of the suprpatellar pouch;  Surgeon: Latanya Maudlin, MD;  Location: [facility];  Service: Orthopedics;  Laterality: Left;   LEFT HEART CATH AND CORONARY ANGIOGRAPHY N/A 02/26/2019   Procedure: LEFT HEART CATH AND CORONARY ANGIOGRAPHY;  Surgeon: Troy Sine, MD;  Location: [place] [facility];  Service: Cardiovascular;  Laterality: N/A;   LEFT HEART CATH AND CORONARY ANGIOGRAPHY N/A 05/06/2022   Procedure: LEFT HEART CATH AND CORONARY  ANGIOGRAPHY;  Surgeon: Nelva Bush, MD;  Location: [place] [facility];  Service: Cardiovascular;  Laterality: N/A;   MICROLARYNGOSCOPY W/VOCAL CORD INJECTION N/A 08/07/2018   Procedure: MICROLARYNGOSCOPY WITH VOCAL CORD INJECTION OF PROLARYN;  Surgeon: Melida Quitter, MD;  Location: [place];  Service: ENT;  Laterality: N/A;  JET VENTILATION   PACEMAKER IMPLANT N/A 05/08/2022   Procedure: PACEMAKER IMPLANT;  Surgeon: Constance Haw, MD;  Location: [place] [facility];  Service: Cardiovascular;  Laterality: N/A;   REVERSE SHOULDER ARTHROPLASTY Right 03/23/2020   Procedure: REVERSE SHOULDER ARTHROPLASTY;  Surgeon: Nicholes Stairs, MD;  Location: [place];  Service: Orthopedics;  Laterality: Right;   VAGINAL HYSTERECTOMY  1971     Current Outpatient Medications  Medication Sig Dispense Refill   acetaminophen (TYLENOL) 500 MG tablet Take 500 mg by mouth daily as needed for headache.     amiodarone (PACERONE) 200 MG tablet Take 1 tablet (200 mg total) by mouth daily. 90 tablet 3   Ascorbic Acid (VITAMIN C) 1000 MG tablet Take 1,000 mg by mouth daily.     aspirin 81 MG chewable tablet Chew 1 tablet (81 mg total) by mouth daily. 30 tablet 0   Cholecalciferol (VITAMIN D3) 2000 units TABS Take 2,000 Units by mouth daily.     dextromethorphan (DELSYM) 30 MG/5ML liquid Take 15 mg by mouth daily as needed for cough.     docusate sodium (COLACE) 100 MG capsule Take 100 mg by mouth as needed for mild constipation.     ELIQUIS 2.5 MG TABS tablet TAKE 1 TABLET BY MOUTH TWICE  DAILY 160 tablet 3   empagliflozin (JARDIANCE) 10 MG TABS tablet Take 1 tablet (10 mg total) by mouth daily. 90 tablet 3   furosemide (LASIX) 20 MG tablet Take 1 tablet (20 mg total) by mouth daily. 30 tablet 0   hydrALAZINE (APRESOLINE) 50 MG tablet Take 1.5 tablets (75 mg total) by mouth 2 (two) times daily. 270 tablet 3   ipratropium (ATROVENT) 0.06 % nasal spray Place 2 sprays into both nostrils 2 (two) times daily as needed  (allergies).  5   isosorbide mononitrate (IMDUR) 30 MG 24 hr tablet Take 1 tablet (30 mg total) by mouth daily. 90 tablet 3   ketoconazole (NIZORAL) 2 % cream Apply 1 Application topically daily as needed for irritation (for rash under breast).     loratadine (CLARITIN) 10 MG tablet Take 10 mg by mouth daily as needed for allergies.     LORazepam (ATIVAN) 0.5 MG tablet TAKE 1 TABLET BY MOUTH EVERY 6 HOURS AS NEEDED FOR ANXIETY 60 tablet 5   metoprolol succinate (TOPROL-XL) 50 MG 24 hr tablet Take 1 tablet (50 mg total) by mouth daily. 90 tablet 2   Multiple Vitamin (MULTIVITAMIN WITH MINERALS) TABS tablet Take 1 tablet by mouth daily.     nitroGLYCERIN (NITROSTAT) 0.4 MG SL tablet DISSOLVE ONE TABLET UNDER THE TONGUE EVERY 5 MINUTES AS NEEDED FOR CHEST PAIN.  DO NOT EXCEED A TOTAL OF 3 DOSES IN  15 MINUTES 25 tablet 3   pantoprazole (PROTONIX) 40 MG tablet Take 1 tablet (40 mg total) by mouth daily. 30 tablet 10   polyethylene glycol powder (GLYCOLAX/MIRALAX) 17 GM/SCOOP powder Take 17 g by mouth daily. 238 g 0   potassium chloride (KLOR-CON) 10 MEQ tablet Take 1 tablet (10 mEq total) by mouth daily. 90 tablet 3   PRESCRIPTION MEDICATION See admin instructions. CPAP- At bedtime     Probiotic Product (PROBIOTIC GUMMIES PO) Take 2 tablets by mouth every morning.     rosuvastatin (CRESTOR) 40 MG tablet TAKE 1 TABLET BY MOUTH DAILY 100 tablet 2   triamcinolone (NASACORT) 55 MCG/ACT nasal inhaler Place 1 spray into both nostrils daily as needed (for allergies).      No current facility-administered medications for this visit.    Allergies:   Lidocaine, Morphine, Procaine hcl, Sulfonamide derivatives, Amlodipine, Norco [hydrocodone-acetaminophen], Tramadol, Vytorin [ezetimibe-simvastatin], and Tape   Social History:  The patient  reports that she quit smoking about 25 years ago. Her smoking use included cigarettes. She has never used smokeless tobacco. She reports that she does not drink alcohol and  does not use drugs.   Family History:  The patient's family history includes Cancer in her maternal grandmother; Heart disease in her maternal grandfather.    ROS:  Please see the history of present illness.   Otherwise, review of systems is positive for none.   All other systems are reviewed and negative.    PHYSICAL EXAM: VS:  There were no vitals taken for this visit. , BMI There is no height or weight on file to calculate BMI. GEN: Well nourished, well developed, in no acute distress  HEENT: normal  Neck: no JVD, carotid bruits, or masses Cardiac: RRR; no murmurs, rubs, or gallops,no edema  Respiratory:  clear to auscultation bilaterally, normal work of breathing GI: soft, nontender, nondistended, + BS MS: no deformity or atrophy  Skin: warm and dry, device pocket is well healed Neuro:  Strength and sensation are intact Psych: euthymic mood, full affect  EKG:  EKG is not ordered today. Personal review of the ekg ordered 08/14/22 shows atrial paced, LVH  Device interrogation is reviewed today in detail.  See PaceArt for details.   Recent Labs: 06/24/2022: TSH 2.630 08/04/2022: ALT 23; B Natriuretic Peptide 1,908.9 08/06/2022: Magnesium 1.9 08/07/2022: Hemoglobin 11.9; Platelets 197 08/13/2022: BUN 25; Creatinine, Ser 1.96; Potassium 3.4; Sodium 139    Lipid Panel     Component Value Date/Time   CHOL 148 06/13/2021 1459   TRIG 244.0 (H) 06/13/2021 1459   HDL 57.60 06/13/2021 1459   CHOLHDL 3 06/13/2021 1459   VLDL 48.8 (H) 06/13/2021 1459   LDLCALC 50 10/08/2020 0313   LDLDIRECT 54.0 06/13/2021 1459     Wt Readings from Last 3 Encounters:  09/17/22 176 lb 9.6 oz (80.1 kg)  08/14/22 173 lb 3.2 oz (78.6 kg)  08/13/22 171 lb 2 oz (77.6 kg)      Other studies Reviewed: Additional studies/ records that were reviewed today include: TTE 05/04/22  Review of the above records today demonstrates:   1. Left ventricular ejection fraction, by estimation, is >75%. The left   ventricle has hyperdynamic function. The left ventricle has no regional  wall motion abnormalities. Left ventricular diastolic parameters are  indeterminate. Elevated left atrial  pressure.   2. Right ventricular systolic function is normal. The right ventricular  size is normal. There is mildly elevated pulmonary artery systolic  pressure.   3.  Left atrial size was moderately dilated.   4. The mitral valve is normal in structure. Moderate mitral valve  regurgitation. No evidence of mitral stenosis.   5. The aortic valve is tricuspid. Aortic valve regurgitation is not  visualized. Aortic valve sclerosis is present, with no evidence of aortic  valve stenosis.   6. The inferior vena cava is normal in size with greater than 50%  respiratory variability, suggesting right atrial pressure of 3 mmHg.   LHC 05/06/22 Mild-moderate, non-obstructive coronary artery disease. Patent proximal/mid LAD stent with up to 30% in-stent restenosis. Low left ventricular filling pressure (LVEDP ~5 mmHg).  ASSESSMENT AND PLAN:  1.  Paroxysmal atrial fibrillation: CHA2DS2-VASc of 5.  Currently on amiodarone 200 mg daily, Eliquis 2.5 mg twice daily.  2.  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  Device functioning appropriately.  No changes at this time.  3.  Coronary artery disease: No current chest pain.  Plan per primary cardiology  4.  Hypertension: Currently well controlled  5.  Secondary hypercoagulable state: Currently on Eliquis for atrial fibrillation as above  6.  High risk medication monitoring: Currently on amiodarone for atrial fibrillation as above.  ECG without major abnormality.  7.  Apical hypertrophic cardiomyopathy: Low LGE burden.  Plan per primary cardiology.  Current medicines are reviewed at length with the patient today.   The patient does not have concerns regarding her medicines.  The following changes were made today:   none  Labs/ tests ordered today include:  No orders of the defined types were placed in this encounter.    Disposition:   FU 1 year  Signed, Lakie Mclouth Meredith Leeds, MD  09/18/2022 1:42 PM     [place] [address] [phone] (office) [phone] (fax)

## 2022-09-25 DIAGNOSIS — Z1231 Encounter for screening mammogram for malignant neoplasm of breast: Secondary | ICD-10-CM | POA: Diagnosis not present

## 2022-10-02 ENCOUNTER — Other Ambulatory Visit: Payer: Self-pay | Admitting: Family Medicine

## 2022-10-02 DIAGNOSIS — H40023 Open angle with borderline findings, high risk, bilateral: Secondary | ICD-10-CM | POA: Diagnosis not present

## 2022-10-02 DIAGNOSIS — H40031 Anatomical narrow angle, right eye: Secondary | ICD-10-CM | POA: Diagnosis not present

## 2022-10-10 DIAGNOSIS — R490 Dysphonia: Secondary | ICD-10-CM | POA: Diagnosis not present

## 2022-10-10 DIAGNOSIS — J3801 Paralysis of vocal cords and larynx, unilateral: Secondary | ICD-10-CM | POA: Diagnosis not present

## 2022-11-08 ENCOUNTER — Ambulatory Visit (INDEPENDENT_AMBULATORY_CARE_PROVIDER_SITE_OTHER): Payer: PPO

## 2022-11-08 DIAGNOSIS — I495 Sick sinus syndrome: Secondary | ICD-10-CM

## 2022-11-08 LAB — CUP PACEART REMOTE DEVICE CHECK
Battery Remaining Longevity: 145 mo
Battery Voltage: 3.16 V
Brady Statistic AP VP Percent: 0.05 %
Brady Statistic AP VS Percent: 99.88 %
Brady Statistic AS VP Percent: 0 %
Brady Statistic AS VS Percent: 0.07 %
Brady Statistic RA Percent Paced: 99.93 %
Brady Statistic RV Percent Paced: 0.05 %
Date Time Interrogation Session: 20240104184812
Implantable Lead Connection Status: 753985
Implantable Lead Connection Status: 753985
Implantable Lead Implant Date: 20230705
Implantable Lead Implant Date: 20230705
Implantable Lead Location: 753859
Implantable Lead Location: 753860
Implantable Lead Model: 3830
Implantable Lead Model: 5076
Implantable Pulse Generator Implant Date: 20230705
Lead Channel Impedance Value: 361 Ohm
Lead Channel Impedance Value: 380 Ohm
Lead Channel Impedance Value: 494 Ohm
Lead Channel Impedance Value: 494 Ohm
Lead Channel Pacing Threshold Amplitude: 1.125 V
Lead Channel Pacing Threshold Amplitude: 1.25 V
Lead Channel Pacing Threshold Pulse Width: 0.4 ms
Lead Channel Pacing Threshold Pulse Width: 0.4 ms
Lead Channel Sensing Intrinsic Amplitude: 1.375 mV
Lead Channel Sensing Intrinsic Amplitude: 1.375 mV
Lead Channel Sensing Intrinsic Amplitude: 12.125 mV
Lead Channel Sensing Intrinsic Amplitude: 12.125 mV
Lead Channel Setting Pacing Amplitude: 2.25 V
Lead Channel Setting Pacing Amplitude: 2.5 V
Lead Channel Setting Pacing Pulse Width: 0.4 ms
Lead Channel Setting Sensing Sensitivity: 0.9 mV
Zone Setting Status: 755011
Zone Setting Status: 755011

## 2022-11-11 ENCOUNTER — Telehealth: Payer: Self-pay | Admitting: Family Medicine

## 2022-11-11 ENCOUNTER — Telehealth: Payer: Self-pay | Admitting: Cardiovascular Disease

## 2022-11-11 MED ORDER — ROSUVASTATIN CALCIUM 40 MG PO TABS: 40.0000 mg | ORAL_TABLET | Freq: Every day | ORAL | 1 refills | Status: DC

## 2022-11-11 NOTE — Telephone Encounter (Signed)
Pt notified that this would need to come from Cardiology. pt verbalized understanding and will give them a call.

## 2022-11-11 NOTE — Telephone Encounter (Signed)
Refills has been sent to the pharmacy. 

## 2022-11-11 NOTE — Telephone Encounter (Signed)
*  STAT* If patient is at the pharmacy, call can be transferred to refill team.   1. Which medications need to be refilled? (please list name of each medication and dose if known)   rosuvastatin (CRESTOR) 40 MG tablet    2. Which pharmacy/location (including street and city if local pharmacy) is medication to be sent to? [facility], [address]   3. Do they need a 30 day or 90 day supply?  90 day

## 2022-11-11 NOTE — Telephone Encounter (Signed)
Insurance forms--Patient Assistance forms for Eliquis to be filled out.  Placed in dr's folder.  Please mail back to patient once completed.

## 2022-11-11 NOTE — Telephone Encounter (Signed)
Patient needs a refill on Rosuvastatin called in to [facility] on [address].

## 2022-11-12 ENCOUNTER — Encounter: Payer: Medicare Other | Admitting: Genetic Counselor

## 2022-11-13 NOTE — Telephone Encounter (Signed)
Pt form was received and placed on Dr Sarajane Jews red folder

## 2022-11-15 ENCOUNTER — Other Ambulatory Visit: Payer: Self-pay | Admitting: Family Medicine

## 2022-11-15 NOTE — Telephone Encounter (Signed)
Contract: n/a UDS: n/a Last Visit: 05/14/22 Next Visit: not sched  Last Refill: 04/23/22 with 5 refills  Please Advise

## 2022-11-18 NOTE — Telephone Encounter (Signed)
Pt is aware form will be mail and go out 11-19-2022

## 2022-11-18 NOTE — Telephone Encounter (Signed)
Form completed and will be placed in the mailbox

## 2022-11-18 NOTE — Telephone Encounter (Signed)
Form mailed pt notified of update by front office staff member.

## 2022-11-22 ENCOUNTER — Ambulatory Visit (INDEPENDENT_AMBULATORY_CARE_PROVIDER_SITE_OTHER): Payer: 59 | Admitting: Podiatry

## 2022-11-22 VITALS — BP 119/57

## 2022-11-22 DIAGNOSIS — M79674 Pain in right toe(s): Secondary | ICD-10-CM

## 2022-11-22 DIAGNOSIS — B351 Tinea unguium: Secondary | ICD-10-CM

## 2022-11-22 DIAGNOSIS — M79675 Pain in left toe(s): Secondary | ICD-10-CM | POA: Diagnosis not present

## 2022-11-22 NOTE — Progress Notes (Signed)
  Subjective:  Patient ID: Barbara [name], female    DOB: 08/17/1941,  MRN: [id number]  Barbara [name] presents to clinic today for painful thick toenails that are difficult to trim. Pain interferes with ambulation. Aggravating factors include wearing enclosed shoe gear. Pain is relieved with periodic professional debridement.  Chief Complaint  Patient presents with   Nail Problem    R[facility] PCP-FRY, Stephen PCP VST-08/2022   New problem(s): None.   PCP is Laurey Morale, MD.  Allergies  Allergen Reactions   Lidocaine Anaphylaxis   Morphine Other (See Comments)    "Body shuts down"   Procaine Hcl Anaphylaxis, Rash and Other (See Comments)    "Anything with 'caine' in it "   Sulfonamide Derivatives Hives   Amlodipine Swelling   Norco [Hydrocodone-Acetaminophen] Nausea And Vomiting and Other (See Comments)    States does ok with IV form    Tramadol Nausea And Vomiting   Vytorin [Ezetimibe-Simvastatin] Other (See Comments)    Joint pain   Tape Itching and Other (See Comments)    Patient prefers either paper tape or Coban wrap    Review of Systems: Negative except as noted in the HPI.  Objective: No changes noted in today's physical examination. Vitals:   11/22/22 1139  BP: (!) 119/57   Barbara [name] is a pleasant 82 y.o. female WD, WN in NAD. AAO x 3.  Neurovascular Examination: CFT immediate b/l LE. Palpable DP/PT pulses b/l LE. Digital hair sparse b/l. Skin temperature gradient WNL b/l. No pain with calf compression b/l. Trace edema bilateral ankles.. No cyanosis or clubbing noted b/l LE.  Protective sensation intact 5/5 intact bilaterally with 10g monofilament b/l.  Dermatological:  Pedal skin warm and supple b/l.  No open wounds b/l. No interdigital macerations. Toenails 1-5 b/l elongated, thickened, discolored with subungual debris. +Tenderness with dorsal palpation of nailplates. No hyperkeratotic nor porokeratotic lesions noted  b/l.  Musculoskeletal:  Muscle strength 5/5 to all lower extremity muscle groups bilaterally. No pain, crepitus or joint limitation noted with ROM bilateral LE. No gross bony deformities bilaterally. Utilizes cane for ambulation assistance.  Assessment/Plan: 1. Pain due to onychomycosis of toenails of both feet     -Patient was evaluated and treated. All patient's and/or POA's questions/concerns answered on today's visit. -Continue supportive shoe gear daily. -Mycotic toenails 1-5 bilaterally were debrided in length and girth with sterile nail nippers and dremel without incident. -Patient/POA to call should there be question/concern in the interim.   Return in about 3 months (around 02/21/2023).  Marzetta Board, DPM

## 2022-11-25 ENCOUNTER — Encounter: Payer: Self-pay | Admitting: Podiatry

## 2022-11-26 NOTE — Progress Notes (Signed)
Remote pacemaker transmission.   

## 2022-12-04 DIAGNOSIS — G4733 Obstructive sleep apnea (adult) (pediatric): Secondary | ICD-10-CM | POA: Diagnosis not present

## 2022-12-12 ENCOUNTER — Encounter (HOSPITAL_COMMUNITY): Payer: Self-pay | Admitting: *Deleted

## 2022-12-16 ENCOUNTER — Telehealth: Payer: Self-pay | Admitting: Family Medicine

## 2022-12-16 NOTE — Telephone Encounter (Signed)
Pt called to say [employer] is requesting documentation stating she has Chronic Heart Failure.  Pt states this documentation has to be sent within 30 days to:  Fax:  [phone]

## 2022-12-17 ENCOUNTER — Ambulatory Visit: Payer: PPO | Attending: Genetic Counselor | Admitting: Genetic Counselor

## 2022-12-17 DIAGNOSIS — I422 Other hypertrophic cardiomyopathy: Secondary | ICD-10-CM

## 2022-12-18 NOTE — Telephone Encounter (Signed)
[employer] calling back states the fax should be [phone] and they need this faxed back asap.

## 2022-12-20 ENCOUNTER — Other Ambulatory Visit: Payer: Self-pay | Admitting: *Deleted

## 2022-12-20 MED ORDER — PANTOPRAZOLE SODIUM 40 MG PO TBEC: 40.0000 mg | DELAYED_RELEASE_TABLET | Freq: Every day | ORAL | 3 refills | Status: DC

## 2022-12-20 MED ORDER — AMIODARONE HCL 200 MG PO TABS: 200.0000 mg | ORAL_TABLET | Freq: Every day | ORAL | 3 refills | Status: DC

## 2022-12-20 MED ORDER — ISOSORBIDE MONONITRATE ER 30 MG PO TB24: 30.0000 mg | ORAL_TABLET | Freq: Every day | ORAL | 3 refills | Status: DC

## 2022-12-23 NOTE — Telephone Encounter (Signed)
Pt form has been completed and faxed as directed

## 2022-12-28 NOTE — Progress Notes (Signed)
Referring Provider: Tommye Standard, PA-C  Referral Reason  Barbara [name] is referred for genetic consult and testing of hypertrophic cardiomyopathy.  [place] (III.1 on pedigree) is an 82 year-old [place] American woman reports being told that she had HCM for several years, at least prior to her retiring in 1999 when she worked for [employer] now [employer]. She has been having dyspnea all her life and recently her PCP referred her to a cardiologist and underwent cardiac imaging studies that detected apical cardiac hypertrophy.   She now reports having dyspnea, heart flutters, and dizziness that she suspects is from hypotension. Denies having chest discomfort and syncope.  Traditional Risk Factors Zenayda reports having HTN all her life and states that it is not well-controlled with medication.  Family history  Relation to Proband Pedigree # Current age Heart condition/age of onset Notes  Daughter IV.1 20 None Echo/EKG normal @ 63  Son IV.2 60 None Echo/EKG normal @ 69  Grandchildren V.1-V.2 24, 43 None         Father II.1 Deceased None Died of electrocution @ 97. First met with him @ age 2.  Paternal Aunts/uncles? ? Deceased ? ?  Paternal grandfather 1.1 Deceased ? ?  Paternal grandmother I.2 Deceased ? ?        Mother II.2 Deceased ? Died @ 66 of  ?, mental illness  Maternal aunt II.3 Deceased ? Died @ 40s of ?  Maternal grandfather I.3 Deceased ? Died @ 70s-M.I.   Maternal grandmother I.4 Deceased ? Died @ 16s- Adin was counseled on the genetics of hypertrophic cardiomyopathy (HCM). I explained to the patient that this is an autosomal dominant condition with incomplete penetrance i.e. not all individuals harboring the HCM mutation will present clinically with HCM, and age-related penetrance where clinical presentation of HCM increases with advanced age.   Since HCM is an autosomal dominant condition, first degree-relatives should  seek regular surveillance for HCM.  Clinical screening of first-degree relatives involves echocardiogram and EKG at regular intervals, frequency is typically determined by age, with those in their teens undergoing screening every year and those over the age of 22 getting screened every 3-5 years. Patient verbalized understanding of this.  I informed the patient that about 8-10% of HCM patients can have compound and digenic mutations for HCM. Also briefly discussed the inheritance pattern and treatment /management plans for the infiltrative cardiomyopathies that present as HCM phenocopies.   We walked through the process of genetic testing. I explained to the patient that genetic testing is a probabilistic test dependent upon age and severity of presentation, presence of risk factors for HCM and importantly family history of HCM or sudden death in first-degree relatives.   The potential outcomes of genetic testing and subsequent management of at-risk family members were discussed so as to manage expectations-  If a mutation is not identified, then all first-degree relatives should undergo regular screening for HCM. I emphasized that even if the genetic test is negative, it does not mean that she does not have HCM. A negative test result can be due to limitations of the genetic test. Patient verbalized understanding of this.  There is also the likelihood of identifying a "Variant of unknown significance". This result means that the variant has not been detected in a statistically significant number of HCM patients and/or functional studies have not been performed to verify its pathogenicity. This VUS can be tested in the family to see if it segregates with  disease. If a VUS is found, first-degree relatives should undergo regular clinical screening for HCM.  If a pathogenic variant is reported, then first-degree family members can get tested for this variant. If they test positive, it is likely they will  develop HCM. In light of variable expression and incomplete penetrance associated with HCM, it is not possible to predict when they will manifest clinically with HCM. It is recommended that family members that test positive for the familial pathogenic variant pursue clinical screening for HCM. Family members that test negative for the familial mutation need not pursue periodic screening for HCM, but seek care if symptoms develop.   Impression  Ladeana presents with HCM in her 77s. however, she reports no family history of HCM or sudden death. Hence, it is likely that she may have a de novo mutation for HCM. HCM being an autosomal dominant condition, her children are at a 50% risk of inheriting HCM.  Genetic testing for HCM will determine if she harbors a pathogenic variant and assess the risk of her children harboring the pathogenic variant. HCM genetic test should include the sarcomeric genes implicated in HCM as well as the genes for the HCM phenocopies, such as Fabry disease, Danon disease, WPW syndrome and familial transthyretin amyloidosis.  In addition, we discussed the protections afforded by the Genetic Information Non-Discrimination Act (GINA). I explained to the patient that GINA protects them from losing their employment or health insurance based on their genotype. However, these protections do not cover life insurance and disability. Patient verbalized understanding of this and tells me that her children have life insurance through work.  Please note that the patient has not been counseled in this visit on personal, cultural or ethical issues that they may face due to their heart condition.   Plan After a thorough discussion of the risk and benefits of genetic testing for HCM, Tamlyn declines pursue genetic testing for HCM due to insurance noncoverage issues. She is aware that her children need regular screening for HCM.  Lattie Corns, Ph.D, [facility] Clinical Molecular Geneticist

## 2023-01-14 ENCOUNTER — Other Ambulatory Visit: Payer: Self-pay

## 2023-01-14 NOTE — Patient Outreach (Signed)
Aging Gracefully Program  01/14/2023  Barbara [name] [birth year] [id number]   [facility] Evaluation Interviewer made contact with patient. Aging Gracefully survey completed.   Interviewer will send referral to RN and OT for follow up.   [place] Management Assistant [phone]

## 2023-01-15 DIAGNOSIS — I495 Sick sinus syndrome: Secondary | ICD-10-CM | POA: Diagnosis not present

## 2023-01-15 DIAGNOSIS — D6869 Other thrombophilia: Secondary | ICD-10-CM | POA: Diagnosis not present

## 2023-01-15 DIAGNOSIS — I4891 Unspecified atrial fibrillation: Secondary | ICD-10-CM | POA: Diagnosis not present

## 2023-01-15 DIAGNOSIS — E261 Secondary hyperaldosteronism: Secondary | ICD-10-CM | POA: Diagnosis not present

## 2023-01-15 DIAGNOSIS — I25118 Atherosclerotic heart disease of native coronary artery with other forms of angina pectoris: Secondary | ICD-10-CM | POA: Diagnosis not present

## 2023-01-15 DIAGNOSIS — I110 Hypertensive heart disease with heart failure: Secondary | ICD-10-CM | POA: Diagnosis not present

## 2023-01-15 DIAGNOSIS — E876 Hypokalemia: Secondary | ICD-10-CM | POA: Diagnosis not present

## 2023-01-15 DIAGNOSIS — E663 Overweight: Secondary | ICD-10-CM | POA: Diagnosis not present

## 2023-01-15 DIAGNOSIS — G4733 Obstructive sleep apnea (adult) (pediatric): Secondary | ICD-10-CM | POA: Diagnosis not present

## 2023-01-15 DIAGNOSIS — E785 Hyperlipidemia, unspecified: Secondary | ICD-10-CM | POA: Diagnosis not present

## 2023-01-15 DIAGNOSIS — I509 Heart failure, unspecified: Secondary | ICD-10-CM | POA: Diagnosis not present

## 2023-01-15 DIAGNOSIS — E559 Vitamin D deficiency, unspecified: Secondary | ICD-10-CM | POA: Diagnosis not present

## 2023-01-23 ENCOUNTER — Telehealth: Payer: Self-pay | Admitting: Cardiovascular Disease

## 2023-01-23 NOTE — Telephone Encounter (Signed)
Patient calling in bout the pain that she has in her right shoulder blade. Please advise

## 2023-01-23 NOTE — Telephone Encounter (Signed)
Patient returned call.  She states she has Right shoulder pain X 3 days.  She states before this her Left side was hurting at the pacemaker site.  She used a heating pad and it resolved.  Now she said her right shoulder blade hurts. She had attributed the pain to arthritis but isn't sure.  She only sleeps on Right side since pacemaker.  Her BP have been running 120's over 70's with HR in the 80's as that was they set her pacemaker to. She has no other symptoms.  No headache, no  dizziness,  She has also called her PCP regarding shoulder pain but hasn't gotten any call with recommendations yet. Please advise.

## 2023-01-23 NOTE — Telephone Encounter (Signed)
No answer to patient call.  LVM to call office

## 2023-02-05 ENCOUNTER — Other Ambulatory Visit: Payer: Self-pay | Admitting: Occupational Therapy

## 2023-02-05 NOTE — Patient Outreach (Signed)
Aging Gracefully Program  OT Initial Visit  02/05/2023  Barbara [name] Feb 23, 1941 [id number]  Visit:  1- Initial Visit  Start Time:  1400 End Time:  [id number] Total Minutes:  69  CCAP: Typical Daily Routine: What Types Of Care Problems Are You Having Throughout The Day?: Does not feel safe in walk in shower due to no grab bars What Kind Of Help Do You Receive?: Some assist with transportation to appointments or store What Do You Think Would Make Everyday Life Easier For You?: Grab bars in walk in shower What Is A Good Day Like?: When left knee is not as painful What Is A Bad Day Like?: When left knee is more painful Do You Have Time For Yourself?: yes Patient Reported Equipment: Patient Reported Equipment Currently Used: Raised Toilet Seat, Single [employer] (with handles) Other Equipment:: She does have an upright rollator that she says she has only used 2 times Functional Mobility-Walk A Block: Walk A Block: Moderate Difficulty (would have to stop and rest due to SPB) Functional Mobility-Maintain Balance While Showering: Maintaining Balance While Showering: Moderate Difficulty Do You:: Use A Device Importance Of Learning New Strategies:: Very Much Safety: Moderate/Extreme Risk Efficiency: Somewhat Intervention: Yes Other Comments:: grab bars with lower attachement for handheld shower on grab bar inside shower Functional Mobility-Stooping, Crouching, Kneeling To Retreive Item: Stooping, Crouching, or Kneeling To Retrieve Item: A Lot Of Difficulty (She can bend over to get items but not stoop) Do You:: No Device/No Assistance Functional Mobility-Climb 1 Flight Of Stairs: Climb 1 Flight Of Stairs: A Lot Of Difficulty (would have to stop and rest due to SOB) Functional Mobility-Move In And Out Of Bath/Shower: Move In And Out Of A Bath/Shower: Moderate Difficulty Do You:: No Device/No Assistance Importance Of Learning New Strategies:: Very Much Safety: Moderate/Extreme  Risk Efficiency: Somewhat Intervention: Yes Other Comments:: grab bars installed in walk in shower for stepping into/out ot shower and when standing in shower and/or sit<> stand in showerq Functional Mobility-Get On And Off Toilet: Getting Up From The Floor:  (doesnt think she can do this due to her bad left knee (bone on bone)) Activities of Daily Living-Bathing/Showering: ADL-Bathing/Showering: Moderate Difficulty Do You:: Use A Device Importance Of Learning New Strategies: Very Much Safety: Moderate/Extreme Risk Efficiency: Somewhat Intervention: Yes Other Comments:: grab bars for better balance Instrumental Activities of Daily Living-Making A Bed: Making a Bed: Moderate Difficulty Do You:: No Device/No Assistance Importance Of Learning New Strategies: Moderate Intervention: Yes Other Comments:: bed sheet tucker  Readiness To Change Score:  Readiness to Change Score: 8.67  Home Environment Assessment: Outside Home Entry:: Front entry has 5 steps with 2 rails that are just far enought apart that you cannot reach both at same time. Once up on her stoop there are not handrails to front doo. Side entrance that she always uses has 3 steps with a rail on the left as you exit the house with the rail going down steps approximately half way. Kitchen:: Has a kitchen drawer that broke that her son may be able to fix or look into a new drawer or cabinets.. Has a new dishwasher that needs to be hooked up--son may do this on his next visit as well. Bathroom:: Half bath has a comfort height toilet with toilet riser with handles on it.. May bathroom also has this set up for the toilet. In addition there is a walk in shower with handheld shower head and a built in [employer] seat. No grab bars  were re-installed when she has tub converted to shower so she does not take many showers in there due to fear of falling. The lower part of the door facing on the right as you exit the bathroom looks like it is  pulling away from the wall.   Goals:  Goals Addressed             This Visit's Progress    Patient Stated       She would like to feel more safe getting into and out of her walk in shower as well as more safe while standing in shower. Two grab bars and a hand held shower holder on the grab bar on the long wall of the shower would greatly help with her feeling more safe.     Patient Stated       She would like for it to be easier to get her sheets tucked when making the bed. A bed sheet tucker may help with this.        Post Clinical Reasoning: Clinician View Of Client Situation:: It was a pleasure meeting Barbara [name] today as well as realizing I had also seen her October when she was in the hospital. She gets around with a [facility], drives herself to some things and others someone takes her. The friend that was helping her with her groceries passed away a couple of weeks ago. The only helath problem that she says really limits her is SOB with even minimal activity from her CHF. Client View Of His/Her Situation:: Barbara [name] feels she does pretty well for her age and the fact that she was in the hospital 4 times in 2023. She feels the rails she currently has at her steps are sufficient for her at this point and is not interested in a ramp at this point either. Next Visit Plan:: Bed sheet Barbara [name], [facility] [phone]

## 2023-02-07 ENCOUNTER — Ambulatory Visit (INDEPENDENT_AMBULATORY_CARE_PROVIDER_SITE_OTHER): Payer: PPO

## 2023-02-07 DIAGNOSIS — I495 Sick sinus syndrome: Secondary | ICD-10-CM

## 2023-02-08 LAB — CUP PACEART REMOTE DEVICE CHECK
Battery Remaining Longevity: 139 mo
Battery Voltage: 3.11 V
Brady Statistic AP VP Percent: 0.04 %
Brady Statistic AP VS Percent: 99.95 %
Brady Statistic AS VP Percent: 0 %
Brady Statistic AS VS Percent: 0.02 %
Brady Statistic RA Percent Paced: 99.98 %
Brady Statistic RV Percent Paced: 0.04 %
Date Time Interrogation Session: 20240405000222
Implantable Lead Connection Status: 753985
Implantable Lead Connection Status: 753985
Implantable Lead Implant Date: 20230705
Implantable Lead Implant Date: 20230705
Implantable Lead Location: 753859
Implantable Lead Location: 753860
Implantable Lead Model: 3830
Implantable Lead Model: 5076
Implantable Pulse Generator Implant Date: 20230705
Lead Channel Impedance Value: 342 Ohm
Lead Channel Impedance Value: 361 Ohm
Lead Channel Impedance Value: 456 Ohm
Lead Channel Impedance Value: 494 Ohm
Lead Channel Pacing Threshold Amplitude: 1.125 V
Lead Channel Pacing Threshold Amplitude: 1.125 V
Lead Channel Pacing Threshold Pulse Width: 0.4 ms
Lead Channel Pacing Threshold Pulse Width: 0.4 ms
Lead Channel Sensing Intrinsic Amplitude: 1.375 mV
Lead Channel Sensing Intrinsic Amplitude: 1.375 mV
Lead Channel Sensing Intrinsic Amplitude: 12.25 mV
Lead Channel Sensing Intrinsic Amplitude: 12.25 mV
Lead Channel Setting Pacing Amplitude: 2.25 V
Lead Channel Setting Pacing Amplitude: 2.5 V
Lead Channel Setting Pacing Pulse Width: 0.4 ms
Lead Channel Setting Sensing Sensitivity: 0.9 mV
Zone Setting Status: 755011
Zone Setting Status: 755011

## 2023-02-12 NOTE — Telephone Encounter (Signed)
Suspect non cardiac pain; particularly if improved with heating pad

## 2023-02-13 NOTE — Telephone Encounter (Signed)
Spoke with patient and she states the pain has moved to her knees so seems arthritis pain.  She is doing well, no chest pain or Left arm/shoulder pain. Nothing further at this time.

## 2023-02-14 ENCOUNTER — Telehealth: Payer: Self-pay

## 2023-02-14 NOTE — Patient Outreach (Signed)
Aging Gracefully Program  02/14/2023  Barbara [name] [birth year] [id number]  Placed call to patient to schedule RN home visit. Patient agreed to 02/18/2023 at 1230. Confirmed address.  Rowe Pavy RN, BSN, Careers adviser for [employer] Mobile: [phone]

## 2023-02-18 ENCOUNTER — Other Ambulatory Visit: Payer: Self-pay

## 2023-02-18 NOTE — Patient Outreach (Signed)
Aging Gracefully Program  RN Visit  02/18/2023  Barbara [name] 10/27/1941 [id number]  Visit:  RN Visit Number: 1- Initial Visit  RN TIME CALCULATION: Start TIme:  RN Start Time Calculation: 1250 End Time:  RN Stop Time Calculation: 1410 Total Minutes:  RN Time Calculation: 80  Readiness To Change Score:     Universal RN Interventions: Calendar Distribution: Yes Exercise Review: No Medications: Yes Medication Changes: No Mood: Yes Pain: Yes PCP Advocacy/Support: No Fall Prevention: Yes Incontinence: No Clinician View Of Client Situation: Patient alert and oriented. Very pleasant. Ambualtees well without any assistive devices. Patient very knowledgeable about her medications, appointment and how to self manage. She works as the Architect and stays busy. Client View Of His/Her Situation: Patient reports that she feels like she does very well. Reports she has someone to help her with her yard work.  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   Patient reports being afraid to take a shower due to no hand rails.  Reports that she drives to her appointments and enjoys baking cakes. Patient reports that she has always had bowel problems since she was a little girl. Reports only weeky bowel movements. She is prescribed colace and miralax but does not take is on a regular basis.  Most of her family live in [place].  Healthcare Provider Communication: Did Surveyor, mining With [employer] Provider?: No According to Client, Did PCP Report Communication With An Aging Gracefully RN?: No  Clinician View of Client Situation: Clinician View Of Client Situation: Patient alert and oriented. Very pleasant. Ambualtees well without any assistive devices. Patient very knowledgeable about her medications, appointment and how to self manage. She works as the Architect and stays busy. Client's View of His/Her Situation: Client View Of  His/Her Situation: Patient reports that she feels like she does very well. Reports she has someone to help her with her yard work.  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   Patient reports being afraid to take a shower due to no hand rails.  Reports that she drives to her appointments and enjoys baking cakes. Patient reports that she has always had bowel problems since she was a little girl. Reports only weeky bowel movements. She is prescribed colace and miralax but does not take is on a regular basis.  Most of her family live in [place].  Medication Assessment: Do You Have Any Problems Paying For Medications?: No Where Does Client Store Medications?: Other: Research scientist (medical)) Can Client Read Pill Bottles?: No Does Anyone Assist Client In Filling Pillbox?: No Does Anyone Assist Client In Taking Medications?: No Do You Take Vitamin D?: Yes Total Number Of Medications That The Client Takes: 11 Does Client Have Any Questions Or Concerns About Medictions?: No Is Client Complaining Of Any Symptoms That Could Be Side Effects To Medications?: No Any Possible Changes In Medication Regimen?: No   Outpatient Encounter Medications as of 02/18/2023  Medication Sig Note   acetaminophen (TYLENOL) 500 MG tablet Take 500 mg by mouth daily as needed for headache.    amiodarone (PACERONE) 200 MG tablet Take 1 tablet (200 mg total) by mouth daily.    Ascorbic Acid (VITAMIN C) 1000 MG tablet Take 1,000 mg by mouth daily. 02/18/2023: As needed when getting a cold.    aspirin 81 MG chewable tablet Chew 1 tablet (81 mg total) by mouth daily.    Cholecalciferol (VITAMIN D3)  2000 units TABS Take 2,000 Units by mouth daily.    dextromethorphan (DELSYM) 30 MG/5ML liquid Take 15 mg by mouth daily as needed for cough.    docusate sodium (COLACE) 100 MG capsule Take 100 mg by mouth as needed for mild constipation.    ELIQUIS 2.5 MG TABS tablet TAKE 1 TABLET BY MOUTH TWICE  DAILY     empagliflozin (JARDIANCE) 10 MG TABS tablet Take 1 tablet (10 mg total) by mouth daily.    furosemide (LASIX) 20 MG tablet Take 1 tablet (20 mg total) by mouth daily. 02/18/2023: Takes 3-4 times per week.  Does not take if she has plans.    hydrALAZINE (APRESOLINE) 50 MG tablet Take 1.5 tablets (75 mg total) by mouth 2 (two) times daily.    ipratropium (ATROVENT) 0.06 % nasal spray Place 2 sprays into both nostrils 2 (two) times daily as needed (allergies).    ketoconazole (NIZORAL) 2 % cream APPLY 1 APPLICATION TOPICALLY TWO TIMES DAILY AS NEEDED FOR IRRITATION    loratadine (CLARITIN) 10 MG tablet Take 10 mg by mouth daily as needed for allergies.    LORazepam (ATIVAN) 0.5 MG tablet TAKE 1 TABLET BY MOUTH EVERY 6 HOURS AS NEEDED FOR ANXIETY 02/18/2023: Takes 1 tablet at bedtime   metoprolol succinate (TOPROL-XL) 50 MG 24 hr tablet Take 1 tablet (50 mg total) by mouth daily.    Multiple Vitamin (MULTIVITAMIN WITH MINERALS) TABS tablet Take 1 tablet by mouth daily. 02/18/2023: 2 times per week   polyethylene glycol powder (GLYCOLAX/MIRALAX) 17 GM/SCOOP powder Take 17 g by mouth daily.    potassium chloride (KLOR-CON) 10 MEQ tablet Take 1 tablet (10 mEq total) by mouth daily.    PRESCRIPTION MEDICATION See admin instructions. CPAP- At bedtime    Probiotic Product (PROBIOTIC GUMMIES PO) Take 2 tablets by mouth every morning.    rosuvastatin (CRESTOR) 40 MG tablet Take 1 tablet (40 mg total) by mouth daily.    triamcinolone (NASACORT) 55 MCG/ACT nasal inhaler Place 1 spray into both nostrils daily as needed (for allergies).     isosorbide mononitrate (IMDUR) 30 MG 24 hr tablet Take 1 tablet (30 mg total) by mouth daily.    nitroGLYCERIN (NITROSTAT) 0.4 MG SL tablet DISSOLVE ONE TABLET UNDER THE TONGUE EVERY 5 MINUTES AS NEEDED FOR CHEST PAIN.  DO NOT EXCEED A TOTAL OF 3 DOSES IN 15 MINUTES (Patient not taking: Reported on 02/18/2023)    pantoprazole (PROTONIX) 40 MG tablet Take 1 tablet (40 mg total)  by mouth daily.    No facility-administered encounter medications on file as of 02/18/2023.    OT Update: Pending home assessment from [facility]  Session Summary: Patient doing very well.     Goals Addressed               This Visit's Progress     Patient Stated (pt-stated)        Goal:  Patient will have a bowel movement 2-3 times per week in the next 120days.   02/18/2023 Assessment: reviewed bowel history of only 1 bowel movement per week. 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   Interventions: Reviewed importance of exercise, fiber, hydration and use of stool softeners.  Encouraged patient to take her colace daily.  Provided [facility] calendar and daily tool. Reviewed Afib zones, CHF zones. Reviewed when to call MD.  Plan: Follow up home visit 03/21/2023.   Rowe Pavy, RN, BSN, CEN [facility] Community Care Coordinator [phone]   CLIENT/RN ACTION PLAN -  GENERIC - (smartphrase AGRNGENERIC)  Registered Nurse:  Rowe Pavy RN Date:  02/18/2023  Client Name  Barbara [name] Client ID:    Target Area:  Bowels   Why Problem May Occur: Chronic problem with lazy bowel.   Target Goal:  Patient will report having a bowel movement 2-3 times per week.  STRATEGIES Coping Strategies: Ideas  Be more aware of date that you have a bowel movement. Write the date of your bowel movements on a calendar.               Prevention Ideas   Increase fiber   Increase water intake   Take colace daily.         PRACTICE It is important to practice the strategies so we can determine if they will be effective in helping to reach the goal.    Follow these specific recommendations:   Take colace daily.  Keep track of when you are having a bowel movement.   If strategy does not work the first time, try it again.     We may make some changes over the next few sessions.      Rowe Pavy RN, BSN, Careers adviser for [employer] Mobile:  [phone]        Rowe Pavy RN, BSN, Careers adviser for [employer] Mobile: [phone]

## 2023-02-18 NOTE — Patient Instructions (Signed)
Visit Information  Thank you for taking time to visit with me today. Please don't hesitate to contact me if I can be of assistance to you before our next scheduled home appointment.  Following are the goals we discussed today:   Goals Addressed               This Visit's Progress     Patient Stated (pt-stated)        Goal:  Patient will have a bowel movement 2-3 times per week in the next 120days.   02/18/2023 Assessment: reviewed bowel history of only 1 bowel movement per week. 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   Interventions: Reviewed importance of exercise, fiber, hydration and use of stool softeners.  Encouraged patient to take her colace daily.  Provided [facility] calendar and daily tool. Reviewed Afib zones, CHF zones. Reviewed when to call MD.  Plan: Follow up home visit 03/21/2023.   Rowe Pavy, RN, BSN, CEN [facility] Community Care Coordinator [phone]   CLIENT/RN ACTION PLAN - GENERIC - [facility] AGRNGENERIC)  Registered Nurse:  Rowe Pavy RN Date:  02/18/2023  Client Name  Barbara [name] Client ID:    Target Area:  Bowels   Why Problem May Occur: Chronic problem with lazy bowel.   Target Goal:  Patient will report having a bowel movement 2-3 times per week.  STRATEGIES Coping Strategies: Ideas  Be more aware of date that you have a bowel movement. Write the date of your bowel movements on a calendar.               Prevention Ideas   Increase fiber   Increase water intake   Take colace daily.         PRACTICE It is important to practice the strategies so we can determine if they will be effective in helping to reach the goal.    Follow these specific recommendations:   Take colace daily.  Keep track of when you are having a bowel movement.   If strategy does not work the first time, try it again.     We may make some changes over the next few sessions.      Rowe Pavy RN, BSN, CEN RN Case Production designer, theatre/television/film for Aging Gracefully [facility] Mobile: [phone]         /Our next appointment is on 03/21/2023  at 1030am  If you are experiencing a Mental Health or Behavioral Health Crisis or need someone to talk to, please call the Suicide and Crisis Lifeline: 988 call the [place] National Suicide Prevention Lifeline: 905-474-8874 or TTY: 612-141-6383 TTY 903-170-2293) to talk to a trained counselor call 1-800-273-TALK (toll free, 24 hour hotline) go to [facility] [address], [employer] [phone]) call 911   The patient verbalized understanding of instructions, educational materials, and care plan provided today and agreed to receive a mailed copy of patient instructions, educational materials, and care plan.   Rowe Pavy RN, BSN, Careers adviser for [employer] Mobile: [phone]

## 2023-03-05 DIAGNOSIS — H40023 Open angle with borderline findings, high risk, bilateral: Secondary | ICD-10-CM | POA: Diagnosis not present

## 2023-03-05 DIAGNOSIS — H40031 Anatomical narrow angle, right eye: Secondary | ICD-10-CM | POA: Diagnosis not present

## 2023-03-05 DIAGNOSIS — H2511 Age-related nuclear cataract, right eye: Secondary | ICD-10-CM | POA: Diagnosis not present

## 2023-03-05 DIAGNOSIS — H25011 Cortical age-related cataract, right eye: Secondary | ICD-10-CM | POA: Diagnosis not present

## 2023-03-07 ENCOUNTER — Ambulatory Visit (INDEPENDENT_AMBULATORY_CARE_PROVIDER_SITE_OTHER): Payer: 59 | Admitting: Podiatry

## 2023-03-07 ENCOUNTER — Encounter: Payer: Self-pay | Admitting: Podiatry

## 2023-03-07 DIAGNOSIS — M79675 Pain in left toe(s): Secondary | ICD-10-CM | POA: Diagnosis not present

## 2023-03-07 DIAGNOSIS — M79674 Pain in right toe(s): Secondary | ICD-10-CM

## 2023-03-07 DIAGNOSIS — B351 Tinea unguium: Secondary | ICD-10-CM

## 2023-03-07 NOTE — Progress Notes (Signed)
  Subjective:  Patient ID: Barbara [name], female    DOB: 05-23-1941,  MRN: [id number]  Barbara [name] presents to clinic today for painful elongated mycotic toenails 1-5 bilaterally which are tender when wearing enclosed shoe gear. Pain is relieved with periodic professional debridement.  Chief Complaint  Patient presents with   Nail Problem    [facility] PCP-Fry, Stephen PCP VST-2023   New problem(s): None.   PCP is [name], MD.  Allergies  Allergen Reactions   Lidocaine Anaphylaxis   Morphine Other (See Comments)    "Body shuts down"   Procaine Hcl Anaphylaxis, Rash and Other (See Comments)    "Anything with 'caine' in it "   Sulfonamide Derivatives Hives   Amlodipine Swelling   Norco [Hydrocodone-Acetaminophen] Nausea And Vomiting and Other (See Comments)    States does ok with IV form    Tramadol Nausea And Vomiting   Vytorin [Ezetimibe-Simvastatin] Other (See Comments)    Joint pain   Tape Itching and Other (See Comments)    Patient prefers either paper tape or Coban wrap    Review of Systems: Negative except as noted in the HPI.  Objective: No changes noted in today's physical examination. There were no vitals filed for this visit. GATHA JENTZEN is a pleasant 82 y.o. female WD, WN in NAD. AAO x 3.  Neurovascular Examination: CFT immediate b/l LE. Palpable DP/PT pulses b/l LE. Digital hair sparse b/l. Skin temperature gradient WNL b/l. No pain with calf compression b/l. Trace edema bilateral ankles.. No cyanosis or clubbing noted b/l LE.  Protective sensation intact 5/5 intact bilaterally with 10g monofilament b/l.  Dermatological:  Pedal skin warm and supple b/l.  No open wounds b/l. No interdigital macerations. Toenails 1-5 b/l elongated, thickened, discolored with subungual debris. +Tenderness with dorsal palpation of nailplates. No hyperkeratotic nor porokeratotic lesions noted b/l.  Musculoskeletal:  Muscle strength 5/5 to all lower  extremity muscle groups bilaterally. No pain, crepitus or joint limitation noted with ROM bilateral LE. No gross bony deformities bilaterally. Utilizes cane for ambulation assistance.  Assessment/Plan: 1. Pain due to onychomycosis of toenails of both feet     -Consent given for treatment as described below: -Examined patient. -Patient to continue soft, supportive shoe gear daily. -Toenails were debrided in length and girth 1-5 bilaterally with sterile nail nippers and dremel without iatrogenic bleeding.  -Patient/POA to call should there be question/concern in the interim.   Return in about 3 months (around 06/07/2023).  Freddie Breech, DPM

## 2023-03-11 ENCOUNTER — Ambulatory Visit: Payer: PPO | Attending: Cardiovascular Disease | Admitting: Cardiovascular Disease

## 2023-03-11 VITALS — BP 136/62 | HR 62 | Ht 67.0 in | Wt 171.0 lb

## 2023-03-11 DIAGNOSIS — I10 Essential (primary) hypertension: Secondary | ICD-10-CM

## 2023-03-11 DIAGNOSIS — E785 Hyperlipidemia, unspecified: Secondary | ICD-10-CM

## 2023-03-11 DIAGNOSIS — G4733 Obstructive sleep apnea (adult) (pediatric): Secondary | ICD-10-CM

## 2023-03-11 DIAGNOSIS — Z950 Presence of cardiac pacemaker: Secondary | ICD-10-CM | POA: Diagnosis not present

## 2023-03-11 DIAGNOSIS — E7841 Elevated Lipoprotein(a): Secondary | ICD-10-CM

## 2023-03-11 DIAGNOSIS — I480 Paroxysmal atrial fibrillation: Secondary | ICD-10-CM

## 2023-03-11 DIAGNOSIS — I422 Other hypertrophic cardiomyopathy: Secondary | ICD-10-CM

## 2023-03-11 DIAGNOSIS — Z7901 Long term (current) use of anticoagulants: Secondary | ICD-10-CM | POA: Diagnosis not present

## 2023-03-11 DIAGNOSIS — M25473 Effusion, unspecified ankle: Secondary | ICD-10-CM | POA: Diagnosis not present

## 2023-03-11 DIAGNOSIS — N1832 Chronic kidney disease, stage 3b: Secondary | ICD-10-CM | POA: Diagnosis not present

## 2023-03-11 NOTE — Progress Notes (Signed)
Cardiology Office Note    Date:  03/13/2023   ID:  Barbara [name], DOB Jun 07, 1941, MRN [id number]  PCP:  Barbara Salisbury, MD  Cardiologist:  Barbara Guadalajara, MD   6 month F/U follow-up evaluation  History of Present Illness:  Barbara [name] is a 82 y.o. female who presents for 40-month follow-up cardiology and sleep evaluation.  Barbara [name] has CAD and in November 2000 underwent stenting of a high-grade LAD stenosis with an [id number] 3.518 mm bare-metal stent. A nuclear perfusion study in November 2013 was unchanged from previously and continued to show normal perfusion without scar or ischemia. Ejection fraction was 68%. An echo Doppler study revealed an ejection fraction in the 55-60% range with grade 1 diastolic dysfunction. 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   Additional problems include hypertension as well as hyperlipidemia. Over the past several months she has noticed that she is more tired.  She also has noticed more shortness of breath with activity.  She has noted some vague indigestion symptoms.  She admits to some mild ankle swelling.  She has been taking Crestor 10 mg and denies myalgias.  She has been on losartan HCT 100/25 as well as amlodipine 10 mg and Lasix 20 mg for blood pressure and peripheral edema.   When I saw her in November 2015 her blood pressure was controlled.  She was bradycardic not on any rate control medication and I raise the possibility of a component of chronotropic incompetence.  To further evaluate her exertional dyspnea she underwent a nuclear perfusion study on 10/06/2014 which was normal.  Post stress ejection fraction was 67%.  An echo Doppler study done on 10/06/2014 showed an EF of 60-65% with moderate left ventricular hypertrophy.  There was grade 1 diastolic dysfunction.  She had indeterminate LV filling pressure.  There was mild  aortic sclerosis without stenosis, mitral annular calcification with mild MR, and mild dilatation of her left atrium with mild tricuspid regurgitation.  Pulmonary pressures were minimally elevated at 31 mm.   She underwent successful left knee surgery by Dr. Worthy [name] in March 2016.  She tolerated surgery well from a cardiovascular standpoint.   An echo Doppler study in March 2017 showed mild LVH with vigorous LV function with an EF of 65-70%.  There was grade 2 diastolic dysfunction.  There was moderate aortic sclerosis without stenosis.  There was mild LA dilation.  PA pressures were upper normal.   When I saw her in May 2018, she had noticed issues with ankle swelling.  She had stopped taking furosemide since his had expired.  I elected to institute spironolactone with her moderate diastolic dysfunction.  She was taking amlodipine 10 mg, losartan HCT 100/25 mg and she has been on spironolactone 25 mg daily.     At her evaluation in August 2018 she was in sinus rhythm and had bilateral ankle and feet swelling.  I reduced her amlodipine to 5 mg and further titrated spironolactone to 25 mg twice a day.  Recently she has been feeling well.  She went to [place] over Christmas and did well.  However, since 11/05/2017 she noticed her heart rate being a little faster and subsequently felt some irregularity.  I  I saw her in the office on 11/10/2017 and she was in atrial flutter with variable block. Her blood pressure was elevated.  I started metoprolol 25 mg  twice a day and started eliquis 5 mg twice a day.  An echo Doppler study on November 17, 2017 showed an EF of 70-75% with grade 2 diastolic dysfunction. There was mild to moderate MR, moderately severe LA enlargement, and PA pressure was increased at 53 mm.  She was hospitalized and underwent Tikosyn loading with Barbara [name], which ultimately pharmacologically cardioverted her back to sinus rhythm.  She was seen in follow-up in the atrial fibrillation  clinic on 12/01/2017.  At that time, she was maintaining sinus rhythm and her QT interval was stable.     I saw her on December 03, 2017 at which time she was doing well.  Subsequently she has seen Barbara [name].  Has had issues with hoarseness and was referred to Barbara [name] for ENT evaluation.  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  She is scheduled to undergo a CT of the soft tissue of her neck with contrast on January 20, 2018.  She was recently notified that her valsartan has a contaminant.  She denies any awareness of recurrent atrial fibrillation.  She denies significant swelling.  She has been maintained on furosemide 40 mg daily, losartan 50 mg for hypertension.  She denies bleeding on Eliquis 5 mg twice daily.  She continues to be on rosuvastatin for hyperlipidemia.  She is on Tikosyn 125 mcg twice a day.     I saw her in the office in March 2019, she has continued to do well.  Due to concerns for obstructive sleep apnea she was referred for a sleep study.  This was done in December 2019 revealed severe sleep apnea with an AHI at 37/h, with REM sleep AHI at 52/h in supine sleep AHI at 47/h.  She had significant oxygen desaturation to a nadir of 77%.  CPAP was instituted and she was titrated up to 11 cm.  She was started on CPAP therapy in 2020 with [employer] as her DME company.  A most recent download from April 7 through Mar 10, 2019 shows that she is meeting compliance standards with 90% of usage days.  She is averaging 6 hours and 42 minutes of usage per night.  At 10 cm water pressure AHI is excellent at 2.9.  There is no leak.  She now has nasal pillows, but previously it had a full facemask which she did not tolerate.   She developed episodes of chest pain and increasing shortness of breath leading to her hospitalization in April 2020.  Was mild troponin elevation and she had inferolateral T wave abnormalities.  She underwent cardiac catheterization.  The results are as shown  below and she was found to have nonobstructive CAD with a widely patent previously placed LAD stent and mild concomitant CAD.  She had LVH with a "Spade" like ventricle.   She was evaluated by me on Mar 11, 2019 in a telemedicine visit.  At that time she denied any chest pain but admitted to some shortness of breath.  She was sleeping better.  However she was still not sleeping long and often only averaging between 6 to 7 hours of sleep per night which resulted in some fatigue during the day.  She was unaware of breakthrough snoring.  A download was obtained which showed an AHI of 2.9 at 10 cm set pressure.  She was evaluated by Leda Min in December 2020 in our office.  She continued to do well without chest pain.  Her blood pressure  was elevated.  She was maintaining sinus rhythm on Tikosyn 125 mg twice a day as well as Eliquis 5 mg twice a day.  ECG showed sinus bradycardia at 54 bpm.  I saw her Mar 31, 2020.  Prior to that evaluation she had undergone  shoulder surgery on Mar 23, 2020 the right reverse shoulder arthroplasty by Dr. Aundria Rud for advanced rotator cuff arthropathy and multiple tears.  His surgery well from a cardiac standpoint.  Since surgery she has been in the sling and during this time has not been able to use CPAP.  She will be having a follow-up orthopedic visit over the next several days and hopefully the sling will be taken off and CPAP can be reinstituted.  She denied any recent chest tightness or pressure.  She was unaware of palpitations.  I saw her in September 2021.  Over the last several months, she felt well.  She has gained mobility back to her right shoulder.  She again has resumed CPAP therapy.  A download was obtained from August 2 through July 04, 2020.  She had undergone an echo Doppler study which showed hyperdynamic LV function with ejection fraction at 70 to 75%.  There was mild aortic sclerosis without stenosis.  Estimated RV systolic pressure was mildly  increased at 35.9.  She had apical hypertrophy with obliteration of LV cavity in systole and apex suggestive for apical hypertrophic cardiomyopathy.  She was without chest pain or shortness of breath.   She was hospitalized in December 2021 with atrial fibrillation with RVR.  She was restarted on Eliquis.  She was felt to have demand ischemia.  Tikosyn was restarted.  She was seen by EP who recommended she wear a Zio patch which revealed frequent PACs, nonsustained atrial tachycardia and a disorganized atrial activity without recurrent atrial fibrillation.  She was seen by Joni Reining, NP in January 2022.  She was caring for her husband and was not using her CPAP.  I saw her in March 2022 at which time she remained stable.  She denied any chest pain or awareness of recurrent atrial fibrillation.  She was inconsistent with CPAP use.  Her husband was ill and was hospitalized and often times she was caring for her husband between 1 AM and 5 AM prior to his hospitalization the day prior to her evaluation with me.  At 10 cm water pressure AHI was 2.1 with average use at 6 hours and 22 minutes.  She continues to be on Eliquis for anticoagulation without bleeding.  Her blood pressure remained stable.  She continued to be on rosuvastatin 40 mg with LDL at 50 in December 2021. She denies any chest pain or awareness of recurrent A. fib.  Her husband was apparently hospitalized yesterday.  She has not been consistently using CPAP for long duration since oftentimes she was caring for her husband from 1 AM to 5 AM prior to his hospitalization.  However we did obtain a download in the office from February 14 through January 16, 2021.  Usage was 70% with average usage 6 hours and 22 minutes.  At a 10 cm set pressure, AHI was 2.1.  She has continued to be on amlodipine 5 mg, candesartan 16 mg, and spironolactone 25 mg for hypertension.  She is tolerating Tikosyn 125 mg twice a day.  She was on rosuvastatin for  hyperlipidemia.   I saw her on August 08, 2021 at which time she remained cardiac stable.  She recently was found to  have a urinary tract infection has been on antibiotics by Dr. Gershon Crane.  She continues to use CPAP therapy but over the past month she had reduced compliance.  With average use just under 6 hours per night.  AHI is 1.4.  She had laboratory by her primary physician in August 2022.  Creatinine was 1.60.  She denies any chest pain, awareness of recurrent atrial fibrillation, or leg swelling.    I last saw her on Mar 13, 2022.  Since I last saw her, unfortunately her husband died on 2021/12/07.  She has noticed blood pressure elevation.  She also admits to some mild ankle swelling.  She denies any chest pain.  She has been using CPAP and typically goes to bed around 1130 and wakes up at 8 AM.  A download was obtained from April 10 through Mar 12, 2022.  She was averaging 6 hours and 52 minutes.  Usage, however was reduced at only 63% due to sinus congestion issues.Marland Kitchen  AHI was 2.3.    Since I saw her, she has had several evaluations and ultimately was hospitalized in July and underwent cardiac catheterization on May 06, 2022 by Dr. Okey Dupre which showed a patent proximal to mid LAD stent with 30% mild in-stent restenosis.  There was mild to moderate concomitant nonobstructive disease.  LVEDP was 5 mm.  She had developed sick sinus syndrome leading to her hospitalization and on July 5 underwent permanent pacemaker insertion by Dr. Elberta Fortis successfully.   She underwent an echo Doppler study May 26, 2022 which showed an EF of 75%, normal RV function, moderate MR.    She presented to the emergency room on October , 2023 with shortness of breath and was found to be in acute hypoxic respiratory failure in the setting of bronchitis and had missed several days of Lasix prior to admission.  She was treated with a 5-day course of antibiotics and nebulized therapy.  During her hospitalization she was  started on Jardiance given Lasix for refills.  Subsequently, she was evaluated in heart failure clinic by Boyce Medici, PA-C.  During her evaluation, she was euvolemic on exam.  She was on Jardiance 10 mg, Lasix 20 mg, Toprol-XL 50 mg daily.  She also is awaiting genetic testing with her diagnosis of LV apical hypertrophy with patchy LGE at apex and at RV insertion site consistent with apical hypertrophic cardiomyopathy noted on CT MRI in 2021.  I last saw her on September 17, 2022 at which time she felt well.  She is on a regimen of amiodarone 200 mg daily, Eliquis 2.5 mg twice a day and ECG today shows atrially paced rhythm at 60 with prolonged AV conduction at 2 94 ms.  She has previously noted T wave inversion.  She continues to be on Jardiance 10 mg, furosemide 20 mg, metoprolol succinate 50 mg, isosorbide 30 mg and has difficulty taking hydralazine which had been scheduled at 50 mg 3 times a day and often many days she only takes 50 twice a day.  She continues to be on rosuvastatin for hyperlipidemia.  She is on pantoprazole for GERD.  She has continued to use CPAP therapy.  A download was obtained from October 15 through September 16, 2022.  Compliance is excellent with average use at 8 hours and 8 minutes.  However, her CPAP unit is set at a pressure of 10 cm.  AHI is elevated at 14.3 with an obstructive apnea index of 10.4 and a central apnea  index of 2.9.  During that evaluation, I changed her CPAP set pressure of 10 cm to an auto mode of 9 to 16 cm of water and increased her ramp start pressure from 4 to 7 cm.  Since I last saw her, she was evaluated by Dr. Elberta Fortis in follow-up of her pacemaker and had normal device function.  Presently she admits to fatigue and being tired.  She usually walks with a cane but not always.  Oftentimes blood pressure at home when she awakens is elevated at 1 55-1 60 but then improves after she takes her medications.  Lower extremity improved off amlodipine.  She  continues to use CPAP.  Her DME company is now [employer].  I obtained a download from April 4 through Mar 07, 2023.  Usage is excellent.  At her pressure range of 9 to 16 cm of water, AHI is 2.6 and her 95th percentile pressure is 12.1 with maximum average pressure of 13.2.  She presents for evaluation.  Past Medical History:  Diagnosis Date   A-fib [facility])    Allergy    CAD (coronary artery disease)    sees Dr. Nicki [name]  cardiac stents - 2000   CHF (congestive heart failure) (HCC)    Colon polyps    Complication of anesthesia    rash/hives with "caines"   Dyspnea    02/12/18 " when my heart gets out of rhythm, it has not been out of rhythym- since I have been on Tikosyn (11/2017)   Dysrhythmia    afib fib   GERD (gastroesophageal reflux disease)    takes OTC- Omeprazole- prn   Heart murmur    History of kidney stones    History of stress test    show normal perfusion without scar or ischemia, post EF 68%   Hx of echocardiogram    show an EF 55%-60% range with grade 1 diastolic dysfunction, she had mitral anular calcification with mild MR, moderate LA dilation and mild pulmonary hypertension with a PA estimated pressure of 39mm   Hyperlipidemia    Hypertension    NSTEMI (non-ST elevated myocardial infarction) (HCC)    Osteoarthritis    Pacemaker    Pneumonia    hx of 2015    PONV (postoperative nausea and vomiting)    Sleep apnea     Past Surgical History:  Procedure Laterality Date   CARDIAC CATHETERIZATION     11/2017   cardiac stents  2000   COLONOSCOPY  01-05-14   per Dr. Dorena Cookey, clear, no repeats needed    COLONOSCOPY     CORONARY STENT PLACEMENT  2000   in LAD   CYSTOSCOPY WITH RETROGRADE PYELOGRAM, URETEROSCOPY AND STENT PLACEMENT Left 06/11/2022   Procedure: CYSTOSCOPY WITH RETROGRADE PYELOGRAM, LEFT STENT PLACEMENT;  Surgeon: Crista Elliot, MD;  Location: [facility];  Service: Urology;  Laterality: Left;   CYSTOSCOPY/URETEROSCOPY/HOLMIUM LASER/STENT PLACEMENT Left  06/25/2022   Procedure: CYSTOSCOPY LEFT URETEROSCOPY/HOLMIUM LASER/STENT PLACEMENT;  Surgeon: Bjorn Pippin, MD;  Location: [facility];  Service: Urology;  Laterality: Left;  1 HR FOR THIS CASE   DIRECT LARYNGOSCOPY WITH RADIAESSE INJECTION N/A 02/13/2018   Procedure: DIRECT LARYNGOSCOPY WITH RADIAESSE INJECTION;  Surgeon: Christia Reading, MD;  Location[facility];  Service: ENT;  Laterality: N/A;   EYE SURGERY Left    CATARACT REMOVAL   KNEE ARTHROSCOPY Left 01/06/2015   Procedure: LEFT KNEE ARTHROSCOPY, abrasion chondroplasty of the medial femerol condryl,medial and lateral menisectomy, microfracture , synovectomy of the  suprpatellar pouch;  Surgeon: Ranee Gosselin, MD;  Location: [facility];  Service: Orthopedics;  Laterality: Left;   LEFT HEART CATH AND CORONARY ANGIOGRAPHY N/A 02/26/2019   Procedure: LEFT HEART CATH AND CORONARY ANGIOGRAPHY;  Surgeon: Lennette Bihari, MD;  Location: [facility];  Service: Cardiovascular;  Laterality: N/A;   LEFT HEART CATH AND CORONARY ANGIOGRAPHY N/A 05/06/2022   Procedure: LEFT HEART CATH AND CORONARY ANGIOGRAPHY;  Surgeon: Yvonne Kendall, MD;  Location: [facility];  Service: Cardiovascular;  Laterality: N/A;   MICROLARYNGOSCOPY W/VOCAL CORD INJECTION N/A 08/07/2018   Procedure: MICROLARYNGOSCOPY WITH VOCAL CORD INJECTION OF PROLARYN;  Surgeon: Christia Reading, MD;  Location: [facility] OR;  Service: ENT;  Laterality: N/A;  JET VENTILATION   PACEMAKER IMPLANT N/A 05/08/2022   Procedure: PACEMAKER IMPLANT;  Surgeon: Regan Lemming, MD;  Location: [facility];  Service: Cardiovascular;  Laterality: N/A;   REVERSE SHOULDER ARTHROPLASTY Right 03/23/2020   Procedure: REVERSE SHOULDER ARTHROPLASTY;  Surgeon: Yolonda Kida, MD;  Location: [facility] OR;  Service: Orthopedics;  Laterality: Right;   VAGINAL HYSTERECTOMY  1971    Current Medications: Outpatient Medications Prior to Visit  Medication Sig Dispense Refill   acetaminophen (TYLENOL) 500 MG tablet Take 500  mg by mouth daily as needed for headache.     amiodarone (PACERONE) 200 MG tablet Take 1 tablet (200 mg total) by mouth daily. 90 tablet 3   Ascorbic Acid (VITAMIN C) 1000 MG tablet Take 1,000 mg by mouth daily.     aspirin 81 MG chewable tablet Chew 1 tablet (81 mg total) by mouth daily. 30 tablet 0   Cholecalciferol (VITAMIN D3) 2000 units TABS Take 2,000 Units by mouth daily.     ELIQUIS 2.5 MG TABS tablet TAKE 1 TABLET BY MOUTH TWICE  DAILY 160 tablet 3   empagliflozin (JARDIANCE) 10 MG TABS tablet Take 1 tablet (10 mg total) by mouth daily. 90 tablet 3   furosemide (LASIX) 20 MG tablet Take 1 tablet (20 mg total) by mouth daily. 30 tablet 0   hydrALAZINE (APRESOLINE) 50 MG tablet Take 1.5 tablets (75 mg total) by mouth 2 (two) times daily. 270 tablet 3   ipratropium (ATROVENT) 0.06 % nasal spray Place 2 sprays into both nostrils 2 (two) times daily as needed (allergies).  5   isosorbide mononitrate (IMDUR) 30 MG 24 hr tablet Take 1 tablet (30 mg total) by mouth daily. 90 tablet 3   loratadine (CLARITIN) 10 MG tablet Take 10 mg by mouth daily as needed for allergies.     LORazepam (ATIVAN) 0.5 MG tablet TAKE 1 TABLET BY MOUTH EVERY 6 HOURS AS NEEDED FOR ANXIETY 60 tablet 5   metoprolol succinate (TOPROL-XL) 50 MG 24 hr tablet Take 1 tablet (50 mg total) by mouth daily. 90 tablet 2   Multiple Vitamin (MULTIVITAMIN WITH MINERALS) TABS tablet Take 1 tablet by mouth daily.     pantoprazole (PROTONIX) 40 MG tablet Take 1 tablet (40 mg total) by mouth daily. 90 tablet 3   polyethylene glycol powder (GLYCOLAX/MIRALAX) 17 GM/SCOOP powder Take 17 g by mouth daily. 238 g 0   potassium chloride (KLOR-CON) 10 MEQ tablet Take 1 tablet (10 mEq total) by mouth daily. 90 tablet 3   PRESCRIPTION MEDICATION See admin instructions. CPAP- At bedtime     Probiotic Product (PROBIOTIC GUMMIES PO) Take 2 tablets by mouth every morning.     rosuvastatin (CRESTOR) 40 MG tablet Take 1 tablet (40 mg total) by mouth  daily. 90 tablet 1   triamcinolone (NASACORT) 55 MCG/ACT nasal inhaler Place 1 spray into both nostrils daily as needed (for allergies).      dextromethorphan (DELSYM) 30 MG/5ML liquid Take 15 mg by mouth daily as needed for cough. (Patient not taking: Reported on 03/11/2023)     docusate sodium (COLACE) 100 MG capsule Take 100 mg by mouth as needed for mild constipation. (Patient not taking: Reported on 03/11/2023)     ketoconazole (NIZORAL) 2 % cream APPLY 1 APPLICATION TOPICALLY TWO TIMES DAILY AS NEEDED FOR IRRITATION (Patient not taking: Reported on 03/11/2023) 60 g 0   nitroGLYCERIN (NITROSTAT) 0.4 MG SL tablet DISSOLVE ONE TABLET UNDER THE TONGUE EVERY 5 MINUTES AS NEEDED FOR CHEST PAIN.  DO NOT EXCEED A TOTAL OF 3 DOSES IN 15 MINUTES (Patient not taking: Reported on 03/11/2023) 25 tablet 3   No facility-administered medications prior to visit.     Allergies:   Lidocaine, Morphine, Procaine hcl, Sulfonamide derivatives, Amlodipine, Norco [hydrocodone-acetaminophen], Tramadol, Vytorin [ezetimibe-simvastatin], and Tape   Social History   Socioeconomic History   Marital status: Widowed    Spouse name: Not on file   Number of children: 2   Years of education: college   Highest education level: High school graduate  Occupational History   Occupation: Retired  Tobacco Use   Smoking status: Former    Years: 40    Types: Cigarettes    Quit date: 11/04/1996    Years since quitting: 26.3   Smokeless tobacco: Never  Vaping Use   Vaping Use: Never used  Substance and Sexual Activity   Alcohol use: No    Alcohol/week: 0.0 standard drinks of alcohol   Drug use: No   Sexual activity: Not Currently  Other Topics Concern   Not on file  Social History Narrative   Not on file   Social Determinants of Health   Financial Resource Strain: Low Risk  (08/05/2022)   Overall Financial Resource Strain (CARDIA)    Difficulty of Paying Living Expenses: Not hard at all  Food Insecurity: No Food  Insecurity (08/05/2022)   Hunger Vital Sign    Worried About Running Out of Food in the Last Year: Never true    Ran Out of Food in the Last Year: Never true  Transportation Needs: No Transportation Needs (08/05/2022)   PRAPARE - Administrator, Civil Service (Medical): No    Lack of Transportation (Non-Medical): No  Physical Activity: Insufficiently Active (07/02/2022)   Exercise Vital Sign    Days of Exercise per Week: 1 day    Minutes of Exercise per Session: 20 min  Stress: No Stress Concern Present (07/02/2022)   [employer] of Occupational Health - Occupational Stress Questionnaire    Feeling of Stress : Not at all  Social Connections: Moderately Integrated (07/02/2022)   Social Connection and Isolation Panel [NHANES]    Frequency of Communication with Friends and Family: More than three times a week    Frequency of Social Gatherings with Friends and Family: More than three times a week    Attends Religious Services: More than 4 times per year    Active Member of [employer] or Organizations: Yes    Attends Banker Meetings: More than 4 times per year    Marital Status: Widowed     Family History:  The patient'sfamily history includes Cancer in her maternal grandmother; Heart disease in her maternal grandfather.   ROS General: Negative; No fevers, chills, or night  sweats;  HEENT: Negative; No changes in vision or hearing, sinus congestion, difficulty swallowing Pulmonary: Negative; No cough, wheezing, shortness of breath, hemoptysis Cardiovascular: See HPI GI: Negative; No nausea, vomiting, diarrhea, or abdominal pain GU: Negative; No dysuria, hematuria, or difficulty voiding Musculoskeletal: Right shoulder surgery May 2021 Hematologic/Oncology: Negative; no easy bruising, bleeding Endocrine: Negative; no heat/cold intolerance; no diabetes Neuro: Negative; no changes in balance, headaches Skin: Negative; No rashes or skin lesions Psychiatric: Negative;  No behavioral problems, depression Sleep: Negative; No snoring, daytime sleepiness, hypersomnolence, bruxism, restless legs, hypnogognic hallucinations, no cataplexy Other comprehensive 14 point system review is negative.   PHYSICAL EXAM:   VS:  BP 136/62   Pulse 62   Ht 5\' 7"  (1.702 m)   Wt 171 lb (77.6 kg)   BMI 26.78 kg/m    Repeat blood pressure by me was elevated at 142/80  Wt Readings from Last 3 Encounters:  03/13/23 171 lb (77.6 kg)  02/18/23 167 lb (75.8 kg)  09/18/22 177 lb 12.8 oz (80.6 kg)    General: Alert, oriented, no distress.  Skin: normal turgor, no rashes, warm and dry HEENT: Normocephalic, atraumatic. Pupils equal round and reactive to light; sclera anicteric; extraocular muscles intact; Nose without nasal septal hypertrophy Mouth/Parynx benign; Mallinpatti scale 3 Neck: No JVD, no carotid bruits; normal carotid upstroke Lungs: clear to ausculatation and percussion; no wheezing or rales Chest wall: without tenderness to palpitation Heart: PMI not displaced, RRR, s1 s2 normal, 1/6 systolic murmur, no diastolic murmur, no rubs, gallops, thrills, or heaves Abdomen: soft, nontender; no hepatosplenomehaly, BS+; abdominal aorta nontender and not dilated by palpation. Back: no CVA tenderness Pulses 2+ Musculoskeletal: full range of motion, normal strength, no joint deformities Extremities: no clubbing cyanosis or edema, Homan's sign negative  Neurologic: grossly nonfocal; Cranial nerves grossly wnl Psychologic: Normal mood and affect    General: Alert, oriented, no distress.  Skin: normal turgor, no rashes, warm and dry HEENT: Normocephalic, atraumatic. Pupils equal round and reactive to light; sclera anicteric; extraocular muscles intact;  Nose without nasal septal hypertrophy Mouth/Parynx benign; Mallinpatti scale 3 Neck: No JVD, no carotid bruits; normal carotid upstroke Lungs: clear to ausculatation and percussion; no wheezing or rales Chest wall: without  tenderness to palpitation Heart: PMI not displaced, RRR, s1 s2 normal, 2/6 systolic murmur, no diastolic murmur, no rubs, gallops, thrills, or heaves Abdomen: soft, nontender; no hepatosplenomehaly, BS+; abdominal aorta nontender and not dilated by palpation. Back: no CVA tenderness Pulses 2+ Musculoskeletal: full range of motion, normal strength, no joint deformities Extremities:trace edema;  no clubbing cyanosis, Homan's sign negative  Neurologic: grossly nonfocal; Cranial nerves grossly wnl Psychologic: Normal mood and affect     Studies/Labs Reviewed:   Mar 11, 2023 ECG (independently read by me): Atrial paced, prolonged AV conduction, PR 324 msec, T wave abnormality  September 17, 2022 ECG (independently read by me):  Atrial paced at 60, prolonged AV conduction, PR 294 msec, T wave abnormality  May 10, 2023ECG (independently read by me):  Sinus bradycardia at 47, LVH, T wave abbnormality  August 08, 2021 ECG (independently read by me): Sinus bradycardia at 56; LVH with repolarization, QTc 428 msec  January 17, 2021 ECG (independently read by me): Sinus bradycardia at 56, LVH with prior T wave abnormalities; QTc 441 msec  September 2021 ECG (independently read by me): Sinus bradycardia at 54; LVHwih T wave abnormality inferolaterally  I personally reviewed the ECG from October 26, 2019 which showed sinus bradycardia at 54 bpm, LVH  with repolarization abnormality.  QTc interval was 398 ms.  Recent Labs:    Latest Ref Rng & Units 08/13/2022    2:48 PM 08/08/2022    5:47 AM 08/07/2022    2:10 AM  BMP  Glucose 70 - 99 mg/dL 147  99  829   BUN 6 - 23 mg/dL 25  37  27   Creatinine 0.40 - 1.20 mg/dL 5.62  1.30  8.65   Sodium 135 - 145 mEq/L 139  138  140   Potassium 3.5 - 5.1 mEq/L 3.4  4.4  3.8   Chloride 96 - 112 mEq/L 103  104  100   CO2 19 - 32 mEq/L 28  25  29    Calcium 8.4 - 10.5 mg/dL 78.4  9.8  69.6         Latest Ref Rng & Units 08/04/2022    4:32 PM 06/12/2022    3:48  AM 06/11/2022    3:13 AM  Hepatic Function  Total Protein 6.5 - 8.1 g/dL 7.3  7.4  6.9   Albumin 3.5 - 5.0 g/dL 4.0  3.5  3.6   AST 15 - 41 U/L 31  19  21    ALT 0 - 44 U/L 23  15  15    Alk Phosphatase 38 - 126 U/L 74  67  64   Total Bilirubin 0.3 - 1.2 mg/dL 1.2  0.8  1.1   Bilirubin, Direct 0.0 - 0.2 mg/dL 0.2          Latest Ref Rng & Units 08/07/2022    2:10 AM 08/06/2022    1:53 AM 08/04/2022    1:48 PM  CBC  WBC 4.0 - 10.5 K/uL 5.8  8.6  9.6   Hemoglobin 12.0 - 15.0 g/dL 29.5  28.4  13.2   Hematocrit 36.0 - 46.0 % 35.5  32.5  32.5   Platelets 150 - 400 K/uL 197  162  160    Lab Results  Component Value Date   MCV 85.5 08/07/2022   MCV 85.5 08/06/2022   MCV 86.4 08/04/2022   Lab Results  Component Value Date   TSH 2.630 06/24/2022   Lab Results  Component Value Date   HGBA1C 6.1 06/13/2021     BNP    Component Value Date/Time   BNP 1,908.9 (H) 08/04/2022 1621    ProBNP No results found for: "PROBNP"   Lipid Panel     Component Value Date/Time   CHOL 148 06/13/2021 1459   TRIG 244.0 (H) 06/13/2021 1459   HDL 57.60 06/13/2021 1459   CHOLHDL 3 06/13/2021 1459   VLDL 48.8 (H) 06/13/2021 1459   LDLCALC 50 10/08/2020 0313   LDLDIRECT 54.0 06/13/2021 1459     RADIOLOGY: No results found.   Additional studies/ records that were reviewed today include:    Sleep Study IMPRESSIONS: 10/07/2018 - Severe obstructive sleep apnea occurred during the diagnostic portion of the study (AHI 36.9/h; RDI 45.1/h); events were worse with supine position (AHI 46.8/h)  and during REM sleep (AHI 52.2/h).  CPAP was initiated at 5 cm and was titrated to optimal PAP pressure of 11 cm of water. - No significant central sleep apnea occurred during the diagnostic portion of the study (CAI = 0.0/hour). - Severe oxygen desaturation during the diagnostic portion of the study to a nadir of 77%. - The patient snored with loud snoring volume during the diagnostic portion of the  study. - No cardiac abnormalities were noted during this study. -  Clinically significant periodic limb movements did not occur during sleep.   DIAGNOSIS - Obstructive Sleep Apnea (327.23 [G47.33 ICD-10])   RECOMMENDATIONS - Trial of CPAP therapy with EPR at 11 cm H2O with heated humidification. A Medium size Resmed Full Face Mask AirFit 20 for Her mask was used for the titration.  - Effort should be made to optimize nasal and oropharyngeal patency. - Avoid alcohol, sedatives and other CNS depressants that may worsen sleep apnea and disrupt normal sleep architecture. - Sleep hygiene should be reviewed to assess factors that may improve sleep quality. - Weight management and regular exercise should be initiated or continued. - Recommend a download in 30 days and sleep clinic evaluation after 4 weeks of therapy.   [Electronically signed] 10/18/2018 04:25 PM   Cath: 02/26/19   Prox RCA to Mid RCA lesion is 15% stenosed. Ost LAD to Prox LAD lesion is 20% stenosed. Prox Cx lesion is 20% stenosed.   Mild non-obstructive coronary artery disease with a patent proximal LAD stent with mild 20% intimal hyperplasia (inserted  09/1999); 20% proximal left circumflex narrowing, and mild irregularity of 10 to 15% in the mid RCA.   Hyperdynamic LV function with an "Ace of Spade "configuration and near cavity obliteration in the mid to apical segment during systole.  There is evidence for left ventricular hypertrophy.  LVEDP is 16 mm.   RECOMMENDATION: Medical therapy with optimal blood pressure control, lipid management, and resumption of Eliquis tomorrow   CATH: 05/06/2022 Conclusions: Mild-moderate, non-obstructive coronary artery disease. Patent proximal/mid LAD stent with up to 30% in-stent restenosis. Low left ventricular filling pressure (LVEDP ~5 mmHg).   Recommendations: Continue secondary prevention of coronary artery disease. Proceed with pacemaker placement per Dr. Graciela Husbands.    ECHO:  05/04/2022 IMPRESSIONS   1. Left ventricular ejection fraction, by estimation, is >75%. The left  ventricle has hyperdynamic function. The left ventricle has no regional  wall motion abnormalities. Left ventricular diastolic parameters are  indeterminate. Elevated left atrial  pressure.   2. Right ventricular systolic function is normal. The right ventricular  size is normal. There is mildly elevated pulmonary artery systolic  pressure.   3. Left atrial size was moderately dilated.   4. The mitral valve is normal in structure. Moderate mitral valve  regurgitation. No evidence of mitral stenosis.   5. The aortic valve is tricuspid. Aortic valve regurgitation is not  visualized. Aortic valve sclerosis is present, with no evidence of aortic  valve stenosis.   6. The inferior vena cava is normal in size with greater than 50%  respiratory variability, suggesting right atrial pressure of 3 mmHg.    ASSESSMENT:    1. Essential hypertension   2. Apical variant hypertrophic cardiomyopathy (HCC)   3. OSA on CPAP   4. Paroxysmal atrial fibrillation (HCC)   5. Cardiac pacemaker in situ   6. Anticoagulated   7. Ankle edema   8. Hyperlipidemia LDL goal <70   9. Elevated Lp(a)   10. Stage 3b chronic kidney disease (HCC)     PLAN:  1.  CAD: Cardiac catheterization from February 26, 2020 demonstrated patent LAD stent with mild nonobstructive CAD involving her circumflex and RCA.  Cardiac catheterization on May 06, 2022 showed a patent LAD stent with 30% in-stent restenosis and mild nonobstructive concomitant CAD.  Presently, she remains pain-free.  Most of the time she walks with a walker and has limited activity.  2.  Exertional dyspnea/apical hypertrophy: She has a history of exertional dyspnea  and has documented LVH.  She has been demonstrated to have a  "spade-like ventricle" with near cavity obliteration in the mid to apical segment of her myocardium during systole.  There is evidence for  diastolic dysfunction.  Echo revealed hyperdynamic LV function with EF at 70 to 75%, grade 2 diastolic dysfunction, and apical hypertrophy with obliteration of her LV cavity in systole at apex given the appearance of her "spade-like ventricle."  She was felt most likely to have apical hypertrophy confirmed on cardiac MRI with patchy late gadolinium enhancement at apex and RV insertion site.  LGE accounted for 2% of total myocardial mass.  She has hyperdynamic LV and RV function.  She was supposed to undergo genetic testing in January 2024.  3. Obstructive sleep apnea: Her initial sleep study from December 2019 showed severe sleep apnea with AHI 36.9, R DI 45.1, rim AHI 52.2 in supine sleep AHI 46.8.  At her office visit in May 2023 a download shows average use at 6 hours 52 minutes.  At 10 cm water pressure AHI is 2.3.  Recent compliance has been reduced as far as usage days due to sinus issues for which she has been evaluated by Dr. Elmer Picker.  I again discussed optimal sleep duration at 7 and 9 hours and potential increased risk for recurrent atrial fibrillation with untreated sleep apnea.  At her last office visit adjustments were made to her CPAP device.  Current download now from April 4 through Mar 07, 2023 shows 100% use with average use at 7 hours and 44 minutes.  At a pressure setting of 9 to 16 cm, AHI is excellent at 2.6 and her 95th percentile pressure is 12.1 with maximum average pressure 13.2.  4.  PAF: She had developed recurrent atrial fibrillation leading to hospitalization and was felt to have sick sinus syndrome.  She continues to be on amiodarone 200 mg daily and is on Eliquis for anticoagulation.  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  She saw Dr. Elberta Fortis in November 2023 for pacemaker follow-up.  On amiodarone, TSH and LFTs were normal when last checked.  5.  CKD: Most recent laboratory shows creatinine 1.96 on August 13, 2022.  She  is no longer on spironolactone.  She is on Jardiance 10 mg, furosemide 20 mg isosorbide, and has been on hydralazine.  6.  Eliquis anticoagulation: Well-tolerated without bleeding.  With her age and creatinine greater than 1.5 her dose had been reduced to 2.5 twice daily.  7.  Essential hypertension: She is now on Jardiance 10 mg, furosemide which she has been taking 20 mg on Tuesday Wednesday and Fridays, as well as hydralazine 75 mg twice a day in addition to isosorbide mononitrate 30 mg and metoprolol 50 mg daily.  Her blood pressure today when checked by me was elevated and she states oftentimes her blood pressure when she wakes up runs 1 55-1 60.  I have recommended she increase her Lasix and take this on a daily basis.  If her blood pressure remains elevated she will titrate hydralazine to 100 mg twice a day.  7.  Hyperlipidemia: She continues to be on rosuvastatin 40 mg.  LDL cholesterol in December 2021 was 50 and direct LDL on June 13, 2021 was 54.  LP(a) in July 2023 was elevated at 206.4.  With her elevated LP(a) Target LDL is less than 55.  She is followed by Dr. Clent Ridges who checks laboratory.  I will  see her in 1 year for follow-up evaluation.  Medication Adjustments/Labs and Tests Ordered: Current medicines are reviewed at length with the patient today.  Concerns regarding medicines are outlined above.  Medication changes, Labs and Tests ordered today are listed in the Patient Instructions below. Patient Instructions  Medication Instructions:  Take Lasix every day.   Monitor BP- if BP stays elevated and you are not able to take the Lasix daily, you can take 100 mg hydralazine at night.    *If you need a refill on your cardiac medications before your next appointment, please call your pharmacy*   Follow-Up: At [facility], you and your health needs are our priority.  As part of our continuing mission to provide you with exceptional heart care, we have created designated  Provider Care Teams.  These Care Teams include your primary Cardiologist (physician) and Advanced Practice Providers (APPs -  Physician Assistants and Nurse Practitioners) who all work together to provide you with the care you need, when you need it.  We recommend signing up for the patient portal called "MyChart".  Sign up information is provided on this After Visit Summary.  MyChart is used to connect with patients for Virtual Visits (Telemedicine).  Patients are able to view lab/test results, encounter notes, upcoming appointments, etc.  Non-urgent messages can be sent to your provider as well.   To learn more about what you can do with MyChart, go to [email or web address].    Your next appointment:   12 month(s)  Provider:   Nicki Guadalajara, MD       Signed, Barbara Guadalajara, MD  03/13/2023 11:11 AM    [facility] [address], [address], [place], [place]  [address] Phone: [phone]

## 2023-03-11 NOTE — Patient Instructions (Signed)
Medication Instructions:  Take Lasix every day.   Monitor BP- if BP stays elevated and you are not able to take the Lasix daily, you can take 100 mg hydralazine at night.    *If you need a refill on your cardiac medications before your next appointment, please call your pharmacy*   Follow-Up: At [facility], you and your health needs are our priority.  As part of our continuing mission to provide you with exceptional heart care, we have created designated Provider Care Teams.  These Care Teams include your primary Cardiologist (physician) and Advanced Practice Providers (APPs -  Physician Assistants and Nurse Practitioners) who all work together to provide you with the care you need, when you need it.  We recommend signing up for the patient portal called "MyChart".  Sign up information is provided on this After Visit Summary.  MyChart is used to connect with patients for Virtual Visits (Telemedicine).  Patients are able to view lab/test results, encounter notes, upcoming appointments, etc.  Non-urgent messages can be sent to your provider as well.   To learn more about what you can do with MyChart, go to [email or web address].    Your next appointment:   12 month(s)  Provider:   Nicki Guadalajara, MD

## 2023-03-12 NOTE — Progress Notes (Signed)
Remote pacemaker transmission.   

## 2023-03-13 ENCOUNTER — Encounter: Payer: Self-pay | Admitting: Cardiovascular Disease

## 2023-03-14 ENCOUNTER — Other Ambulatory Visit: Payer: Self-pay | Admitting: Family Medicine

## 2023-03-16 ENCOUNTER — Other Ambulatory Visit: Payer: Self-pay | Admitting: Physician Assistant

## 2023-03-21 ENCOUNTER — Other Ambulatory Visit: Payer: Self-pay

## 2023-03-21 NOTE — Patient Outreach (Signed)
Aging Gracefully Program  RN Visit  03/21/2023  Barbara [name] 16-Apr-1941 [id number]  Visit:  RN Visit Number: 2- Second Visit  RN TIME CALCULATION: Start TIme:  RN Start Time Calculation: 1025 End Time:  RN Stop Time Calculation: 1100 Total Minutes:  RN Time Calculation: 35  Readiness To Change Score:     Universal RN Interventions: Calendar Distribution: Yes Exercise Review: Yes Medications: Yes Medication Changes: Yes Mood: Yes Pain: Yes PCP Advocacy/Support: No Fall Prevention: Yes Incontinence: No Clinician View Of Client Situation: Rang front door bell and patient came to the door. She had forgotten about appointment. Ambulating without difficulty. Awake and alert. Client View Of His/Her Situation: Patient reports that she has been doing well. Reports she she continues to tire easily. Reports she had follow up with cardiiology and everything is ok.  Reports that she has been self monitoring her BP and is recording this. She took to cardiology office for review. Patient is also keeping up with her bowel movements.   Patient is taking colace daily. Takes Lasix 3-4 times per day depending if she needs to be out of the home.  Healthcare Provider Communication: Did Surveyor, mining With [employer] Provider?: No According to Client, Did PCP Report Communication With An Aging Gracefully RN?: No  Clinician View of Client Situation: Clinician View Of Client Situation: Rang front door bell and patient came to the door. She had forgotten about appointment. Ambulating without difficulty. Awake and alert. Client's View of His/Her Situation: Client View Of His/Her Situation: Patient reports that she has been doing well. Reports she she continues to tire easily. Reports she had follow up with cardiiology and everything is ok.  Reports that she has been self monitoring her BP and is recording this. She took to cardiology office for review. Patient is also keeping up with her  bowel movements.   Patient is taking colace daily. Takes Lasix 3-4 times per day depending if she needs to be out of the home.  Medication Assessment: Outpatient Encounter Medications as of 03/21/2023  Medication Sig Note   acetaminophen (TYLENOL) 500 MG tablet Take 500 mg by mouth daily as needed for headache.    amiodarone (PACERONE) 200 MG tablet Take 1 tablet (200 mg total) by mouth daily.    aspirin 81 MG chewable tablet Chew 1 tablet (81 mg total) by mouth daily.    Cholecalciferol (VITAMIN D3) 2000 units TABS Take 2,000 Units by mouth daily.    docusate sodium (COLACE) 100 MG capsule Take 100 mg by mouth as needed for mild constipation. 03/21/2023: Takes one tablet every other night   ELIQUIS 2.5 MG TABS tablet TAKE 1 TABLET BY MOUTH TWICE  DAILY    empagliflozin (JARDIANCE) 10 MG TABS tablet Take 1 tablet (10 mg total) by mouth daily.    furosemide (LASIX) 20 MG tablet Take 1 tablet by mouth once daily 03/21/2023: Tuesday, Wednesday, Friday, Sat   hydrALAZINE (APRESOLINE) 50 MG tablet Take 1.5 tablets (75 mg total) by mouth 2 (two) times daily. (Patient taking differently: Take 50 mg by mouth daily. Takes 2 tablets at night.)    ipratropium (ATROVENT) 0.06 % nasal spray Place 2 sprays into both nostrils 2 (two) times daily as needed (allergies).    isosorbide mononitrate (IMDUR) 30 MG 24 hr tablet Take 1 tablet (30 mg total) by mouth daily.    loratadine (CLARITIN) 10 MG tablet Take 10 mg by mouth daily as needed for allergies.    metoprolol succinate (TOPROL-XL) 50  MG 24 hr tablet Take 1 tablet by mouth once daily    Multiple Vitamin (MULTIVITAMIN WITH MINERALS) TABS tablet Take 1 tablet by mouth daily. 02/18/2023: 2 times per week   pantoprazole (PROTONIX) 40 MG tablet Take 1 tablet (40 mg total) by mouth daily.    potassium chloride (KLOR-CON) 10 MEQ tablet Take 1 tablet (10 mEq total) by mouth daily.    PRESCRIPTION MEDICATION See admin instructions. CPAP- At bedtime    Probiotic  Product (PROBIOTIC GUMMIES PO) Take 2 tablets by mouth every morning.    rosuvastatin (CRESTOR) 40 MG tablet Take 1 tablet (40 mg total) by mouth daily.    triamcinolone (NASACORT) 55 MCG/ACT nasal inhaler Place 1 spray into both nostrils daily as needed (for allergies).     Ascorbic Acid (VITAMIN C) 1000 MG tablet Take 1,000 mg by mouth daily. (Patient not taking: Reported on 03/21/2023) 02/18/2023: As needed when getting a cold.    dextromethorphan (DELSYM) 30 MG/5ML liquid Take 15 mg by mouth daily as needed for cough. (Patient not taking: Reported on 03/11/2023)    ketoconazole (NIZORAL) 2 % cream APPLY 1 APPLICATION TOPICALLY TWO TIMES DAILY AS NEEDED FOR IRRITATION (Patient not taking: Reported on 03/11/2023)    LORazepam (ATIVAN) 0.5 MG tablet TAKE 1 TABLET BY MOUTH EVERY 6 HOURS AS NEEDED FOR ANXIETY 02/18/2023: Takes 1 tablet at bedtime   nitroGLYCERIN (NITROSTAT) 0.4 MG SL tablet DISSOLVE ONE TABLET UNDER THE TONGUE EVERY 5 MINUTES AS NEEDED FOR CHEST PAIN.  DO NOT EXCEED A TOTAL OF 3 DOSES IN 15 MINUTES (Patient not taking: Reported on 03/11/2023)    polyethylene glycol powder (GLYCOLAX/MIRALAX) 17 GM/SCOOP powder Take 17 g by mouth daily. (Patient not taking: Reported on 03/21/2023)    No facility-administered encounter medications on file as of 03/21/2023.       OT Update: pending home modification by [employer].   Session Summary: Doing well.  Goals on track.    Goals Addressed               This Visit's Progress     Patient Stated (pt-stated)        Goal:  Patient will have a bowel movement 2-3 times per week in the next 120days.   02/18/2023 Assessment: reviewed bowel history of only 1 bowel movement per week. 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   Interventions: Reviewed importance of exercise, fiber, hydration and use of stool softeners.  Encouraged patient to take her colace daily.  Provided [facility] calendar and daily tool. Reviewed Afib zones, CHF zones. Reviewed when  to call MD.  Plan: Follow up home visit 03/21/2023.   Rowe Pavy, RN, BSN, CEN [facility] Care Coordinator [phone]   CLIENT/RN ACTION PLAN - GENERIC - [facility]GR[facility])  Registered Nurse:  Rowe Pavy RN Date:  02/18/2023  Client Name  Barbara [name] Client ID:    Target Area:  Bowels   Why Problem May Occur: Chronic problem with lazy bowel.   Target Goal:  Patient will report having a bowel movement 2-3 times per week.  STRATEGIES Coping Strategies: Ideas  Be more aware of date that you have a bowel movement. Write the date of your bowel movements on a calendar.               Prevention Ideas   Increase fiber   Increase water intake   Take colace daily.         PRACTICE It is important to practice the strategies  so we can determine if they will be effective in helping to reach the goal.    Follow these specific recommendations:   Take colace daily.  Keep track of when you are having a bowel movement.   If strategy does not work the first time, try it again.     We may make some changes over the next few sessions.      Rowe Pavy RN, BSN, CEN RN Case Production designer, theatre/television/film for [employer] [facility] Mobile: [phone]    03/21/2023: Assessment:  Patient keeping a log of Bm's. Continues to take her colace for constipation.  Patient reports that she has to continue to push it out.  Interventions: reviewed BM log.  Week 1    (3)                                                      Week 2  ( 0)                                                      Week 3 ( 4)                                                      Week 4  ( 1)                                                      Week 5    (2)  Reviewed and demonstrated home exercise plan.  Encouraged exercise and hydration.  Encouraged patient to continue to keep log. Reviewed benefit of taking colace 100 mg twice a day.  Plan: Follow up home visit planned for 04/15/2023  Rowe Pavy RN, BSN, CEN RN Case Manager for [employer] Mobile: [phone]        Rowe Pavy RN, BSN, Careers adviser for [employer] Mobile: [phone]

## 2023-03-21 NOTE — Patient Instructions (Addendum)
Visit Information  Thank you for taking time to visit with me today. Please don't hesitate to contact me if I can be of assistance to you before our next scheduled home appointment.  Following are the goals we discussed today:   Goals Addressed               This Visit's Progress     Patient Stated (pt-stated)        Goal:  Patient will have a bowel movement 2-3 times per week in the next 120days.   02/18/2023 Assessment: reviewed bowel history of only 1 bowel movement per week. 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   Interventions: Reviewed importance of exercise, fiber, hydration and use of stool softeners.  Encouraged patient to take her colace daily.  Provided [facility] calendar and daily tool. Reviewed Afib zones, CHF zones. Reviewed when to call MD.  Plan: Follow up home visit 03/21/2023.   Rowe Pavy, RN, BSN, CEN [facility] Community Care Coordinator [phone]   CLIENT/RN ACTION PLAN - GENERIC - [facility] AGRNGENERIC)  Registered Nurse:  Rowe Pavy RN Date:  02/18/2023  Client Name  Barbara [name] Client ID:    Target Area:  Bowels   Why Problem May Occur: Chronic problem with lazy bowel.   Target Goal:  Patient will report having a bowel movement 2-3 times per week.  STRATEGIES Coping Strategies: Ideas  Be more aware of date that you have a bowel movement. Write the date of your bowel movements on a calendar.               Prevention Ideas   Increase fiber   Increase water intake   Take colace daily.         PRACTICE It is important to practice the strategies so we can determine if they will be effective in helping to reach the goal.    Follow these specific recommendations:   Take colace daily.  Keep track of when you are having a bowel movement.   If strategy does not work the first time, try it again.     We may make some changes over the next few sessions.      Rowe Pavy RN, BSN, CEN RN Case Production designer, theatre/television/film for [employer] [facility] Mobile: [phone]    03/21/2023: Assessment:  Patient keeping a log of Bm's. Continues to take her colace for constipation.  Patient reports that she has to continue to push it out.  Interventions: reviewed BM log.  Week 1    (3)                                                      Week 2  ( 0)                                                      Week 3 ( 4)  Week 4  ( 1)                                                      Week 5    (2)  Reviewed and demonstrated home exercise plan.  Encouraged exercise and hydration.  Encouraged patient to continue to keep log. Reviewed benefit of taking colace 100 mg twice a day.  Plan: Follow up home visit planned for 04/15/2023  Rowe Pavy RN, BSN, CEN RN Case Manager for Aging [facility] Mobile: [phone]          Our next appointment is on 04/15/2023 at 1:30 pm  If you are experiencing a Mental Health or Behavioral Health Crisis or need someone to talk to, please call the Suicide and Crisis Lifeline: 988 call the [place] National Suicide Prevention Lifeline: 651-247-3365 or TTY: 641-067-2184 TTY (606)233-3826) to talk to a trained counselor call 1-800-273-TALK (toll free, 24 hour hotline) go to [facility] [address] [place] [phone]) call 911   The patient verbalized understanding of instructions, educational materials, and care plan provided today and agreed to receive a mailed copy of patient instructions, educational materials, and care plan.   Rowe Pavy RN, BSN, Careers adviser for [employer] Mobile: [phone]

## 2023-03-26 ENCOUNTER — Encounter: Payer: Self-pay | Admitting: Family Medicine

## 2023-03-27 ENCOUNTER — Other Ambulatory Visit: Payer: Self-pay | Admitting: Cardiovascular Disease

## 2023-03-31 ENCOUNTER — Other Ambulatory Visit: Payer: Self-pay | Admitting: Cardiovascular Disease

## 2023-03-31 DIAGNOSIS — I4819 Other persistent atrial fibrillation: Secondary | ICD-10-CM

## 2023-03-31 DIAGNOSIS — N1832 Chronic kidney disease, stage 3b: Secondary | ICD-10-CM

## 2023-04-01 NOTE — Telephone Encounter (Signed)
Prescription refill request for Eliquis received. Indication:afib Last office visit:5/24 Scr:1.9 Age: 82 Weight:77.6  kg  Prescription refilled

## 2023-04-02 DIAGNOSIS — H26492 Other secondary cataract, left eye: Secondary | ICD-10-CM | POA: Diagnosis not present

## 2023-04-02 DIAGNOSIS — H25811 Combined forms of age-related cataract, right eye: Secondary | ICD-10-CM | POA: Diagnosis not present

## 2023-04-02 DIAGNOSIS — H04123 Dry eye syndrome of bilateral lacrimal glands: Secondary | ICD-10-CM | POA: Diagnosis not present

## 2023-04-02 DIAGNOSIS — H40023 Open angle with borderline findings, high risk, bilateral: Secondary | ICD-10-CM | POA: Diagnosis not present

## 2023-04-07 ENCOUNTER — Other Ambulatory Visit: Payer: Self-pay | Admitting: Otolaryngology

## 2023-04-10 NOTE — Telephone Encounter (Signed)
Done

## 2023-04-10 NOTE — Telephone Encounter (Signed)
-----   Message from Sherrill Raring, [facility] sent at 04/08/2023 12:03 PM EDT ----- Regarding: 2300 Pharmacy Referral Hello,  Patient is currently flagged as high risk for hospitalization and is eligible to meet with me through the Care Coordination program.  Could a 2300-Pharmacy referral for medication management be placed for the patient so that I can get them scheduled?  Thank you! Sherrill Raring Clinical Pharmacist [phone]

## 2023-04-14 ENCOUNTER — Telehealth: Payer: Self-pay | Admitting: *Deleted

## 2023-04-14 NOTE — Progress Notes (Unsigned)
  Care Coordination  Outreach Note  04/14/2023 Name: Barbara [name] MRN: [id number] DOB: June 07, 1941   Care Coordination Outreach Attempts: An unsuccessful telephone outreach was attempted today to offer the patient information about available care coordination services.  Follow Up Plan:  Additional outreach attempts will be made to offer the patient care coordination information and services.   Encounter Outcome:  No Answer  Burman Nieves, CCMA Care Coordination Care Guide Direct Dial: [phone]

## 2023-04-15 ENCOUNTER — Other Ambulatory Visit: Payer: Self-pay

## 2023-04-16 NOTE — Patient Outreach (Signed)
Aging Gracefully Program  RN Visit  04/16/2023  Barbara [name] [birth year] [id number]  Visit:  RN Visit Number: 3- Third Visit  RN TIME CALCULATION: Start TIme:  RN Start Time Calculation: 1321 End Time:  RN Stop Time Calculation: 1350 Total Minutes:  RN Time Calculation: 29  Readiness To Change Score:     Universal RN Interventions: Calendar Distribution: Yes Exercise Review: Yes Medications: Yes Medication Changes: No Mood: Yes Pain: Yes PCP Advocacy/Support: No Fall Prevention: Yes Incontinence: Yes Clinician View Of Client Situation: Patient ambulatory to the front door without any assistive devices.  Son has pulled out all the content of the kitchen and put in the living room as he is trying to remodel. Patient wearing a right wrist brace. Appears to be in good spirits. Client View Of His/Her Situation: Patient reports that she is doing well. Reports no falls, States her bowel movements have been about every 2-3 days. Reports she is taking her stool softner. reports that she working on [employer] for fathers day.   Reports BP is doing well. Reviewed log.  Healthcare Provider Communication: Did Surveyor, mining With [employer] Provider?: No According to Client, Did PCP Report Communication With An Aging Gracefully RN?: No  Clinician View of Client Situation: Clinician View Of Client Situation: Patient ambulatory to the front door without any assistive devices.  Son has pulled out all the content of the kitchen and put in the living room as he is trying to remodel. Patient wearing a right wrist brace. Appears to be in good spirits. Client's View of His/Her Situation: Client View Of His/Her Situation: Patient reports that she is doing well. Reports no falls, States her bowel movements have been about every 2-3 days. Reports she is taking her stool softner. reports that she working on [employer] for fathers day.   Reports BP is doing well. Reviewed  log.  Medication Assessment: denies any changes to medications    OT Update: Patient interested in the device mentioned by OT to help with her sheets. Message sent to [facility]. Contract with [employer] to be signed next week.   Session Summary: Patient doing well with no new concerns. Progressing towards goals.    Goals Addressed               This Visit's Progress     Patient Stated (pt-stated)        Goal:  Patient will have a bowel movement 2-3 times per week in the next 120days.   02/18/2023 Assessment: reviewed bowel history of only 1 bowel movement per week. 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   Interventions: Reviewed importance of exercise, fiber, hydration and use of stool softeners.  Encouraged patient to take her colace daily.  Provided [facility] calendar and daily tool. Reviewed Afib zones, CHF zones. Reviewed when to call MD.  Plan: Follow up home visit 03/21/2023.   Rowe Pavy, RN, BSN, CEN [facility] Community Care Coordinator [phone]   CLIENT/RN ACTION PLAN - GENERIC - [facility] AGRNGENERIC)  Registered Nurse:  Rowe Pavy RN Date:  02/18/2023  Client Name  Barbara [name] Client ID:    Target Area:  Bowels   Why Problem May Occur: Chronic problem with lazy bowel.   Target Goal:  Patient will report having a bowel movement 2-3 times per week.  STRATEGIES Coping Strategies: Ideas  Be more aware of date that you have a bowel movement. Write the date of your bowel movements on a calendar.  Prevention Ideas   Increase fiber   Increase water intake   Take colace daily.         PRACTICE It is important to practice the strategies so we can determine if they will be effective in helping to reach the goal.    Follow these specific recommendations:   Take colace daily.  Keep track of when you are having a bowel movement.   If strategy does not work the first time, try it again.     We may make some changes over  the next few sessions.      Rowe Pavy RN, BSN, CEN RN Case Production designer, theatre/television/film for [employer] [facility] Mobile: [phone]    03/21/2023: Assessment:  Patient keeping a log of Bm's. Continues to take her colace for constipation.  Patient reports that she has to continue to push it out.  Interventions: reviewed BM log.  Week 1    (3)                                                      Week 2  ( 0)                                                      Week 3 ( 4)                                                      Week 4  ( 1)                                                      Week 5    (2)  Reviewed and demonstrated home exercise plan.  Encouraged exercise and hydration.  Encouraged patient to continue to keep log. Reviewed benefit of taking colace 100 mg twice a day.  Plan: Follow up home visit planned for 04/15/2023  Rowe Pavy RN, BSN, CEN RN Case Manager for Aging [facility] Mobile: [phone]   04/15/23 Assessment: Patient is taking her medications as prescribed. Recording her BM. Doing her home exercises. Patient reports that she will be having throat surgery in a few weeks to help with her hoarseness.   Interventions: Reviewed BP and BM log.  Encouraged patient to continue to take her stool softeners and log her BM's.   Plan: next home visit planned for 05/27/2023  Rowe Pavy RN, BSN, CEN RN Case Manager for [employer] [facility] Mobile: [phone]         Rowe Pavy RN, BSN, Careers adviser for [employer] Mobile: [phone]

## 2023-04-16 NOTE — Patient Instructions (Signed)
Visit Information  Thank you for taking time to visit with me today. Please don't hesitate to contact me if I can be of assistance to you before our next scheduled home appointment.  Following are the goals we discussed today:   Goals       No current goals (pt-stated)      I want to stay healthy.      Patient Stated      Lose weight       Patient Stated      She would like to feel more safe getting into and out of her walk in shower as well as more safe while standing in shower. Two grab bars and a hand held shower holder on the grab bar on the long wall of the shower would greatly help with her feeling more safe.      Patient Stated      She would like for it to be easier to get her sheets tucked when making the bed. A bed sheet tucker may help with this.      Patient Stated (pt-stated)      Goal:  Patient will have a bowel movement 2-3 times per week in the next 120days.   02/18/2023 Assessment: reviewed bowel history of only 1 bowel movement per week. 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   Interventions: Reviewed importance of exercise, fiber, hydration and use of stool softeners.  Encouraged patient to take her colace daily.  Provided [facility] calendar and daily tool. Reviewed Afib zones, CHF zones. Reviewed when to call MD.  Plan: Follow up home visit 03/21/2023.   Rowe Pavy, RN, BSN, CEN [facility] Community Care Coordinator [phone]   CLIENT/RN ACTION PLAN - GENERIC - [facility] AGRNGENERIC)  Registered Nurse:  Rowe Pavy RN Date:  02/18/2023  Client Name  Barbara [name] Client ID:    Target Area:  Bowels   Why Problem May Occur: Chronic problem with lazy bowel.   Target Goal:  Patient will report having a bowel movement 2-3 times per week.  STRATEGIES Coping Strategies: Ideas  Be more aware of date that you have a bowel movement. Write the date of your bowel movements on a calendar.               Prevention Ideas   Increase fiber   Increase water intake    Take colace daily.         PRACTICE It is important to practice the strategies so we can determine if they will be effective in helping to reach the goal.    Follow these specific recommendations:   Take colace daily.  Keep track of when you are having a bowel movement.   If strategy does not work the first time, try it again.     We may make some changes over the next few sessions.      Rowe Pavy RN, BSN, CEN RN Case Production designer, theatre/television/film for [employer] [facility] Mobile: [phone]    03/21/2023: Assessment:  Patient keeping a log of Bm's. Continues to take her colace for constipation.  Patient reports that she has to continue to push it out.  Interventions: reviewed BM log.  Week 1    (3)  Week 2  ( 0)                                                      Week 3 ( 4)                                                      Week 4  ( 1)                                                      Week 5    (2)  Reviewed and demonstrated home exercise plan.  Encouraged exercise and hydration.  Encouraged patient to continue to keep log. Reviewed benefit of taking colace 100 mg twice a day.  Plan: Follow up home visit planned for 04/15/2023  Rowe Pavy RN, BSN, CEN RN Case Manager for [facility] Mobile: [phone]   04/15/23 Assessment: Patient is taking her medications as prescribed. Recording her BM. Doing her home exercises. Patient reports that she will be having throat surgery in a few weeks to help with her hoarseness.   Interventions: Reviewed BP and BM log.  Encouraged patient to continue to take her stool softeners and log her BM's.   Plan: next home visit planned for 05/27/2023  Rowe Pavy RN, BSN, CEN RN Case Manager for Aging [facility] Mobile: [phone]           Our next appointment is on 05/27/2023 at 1pm  If you are  experiencing a Mental Health or Behavioral Health Crisis or need someone to talk to, please call the Suicide and Crisis Lifeline: 988 call the [place] National Suicide Prevention Lifeline: (409)252-8612 or TTY: 930-808-0439 TTY [phone]) to talk to a trained counselor call 1-800-273-TALK (toll free, 24 hour hotline) go to [facility] [address] [place] [phone]) call 911   The patient verbalized understanding of instructions, educational materials, and care plan provided today and agreed to receive a mailed copy of patient instructions, educational materials, and care plan.   Rowe Pavy RN, BSN, Careers adviser for [employer] Mobile: [phone]

## 2023-04-17 ENCOUNTER — Telehealth: Payer: Self-pay | Admitting: Cardiovascular Disease

## 2023-04-17 DIAGNOSIS — I10 Essential (primary) hypertension: Secondary | ICD-10-CM

## 2023-04-17 MED ORDER — BLOOD PRESSURE MONITOR AUTOMAT DEVI: 1.0000 [IU]/min | Freq: Every day | 0 refills | Status: DC

## 2023-04-17 NOTE — Progress Notes (Signed)
  Care Coordination   Note   04/17/2023 Name: Barbara [name] MRN: [id number] DOB: 1941-05-30  [name] is a 82 y.o. year old female who sees [name], MD for primary care. I reached out to Lilla Shook by phone today to offer care coordination services.  [name] was given information about Care Coordination services today including:   The Care Coordination services include support from the care team which includes your Nurse Coordinator, Clinical Social Worker, or Pharmacist.  The Care Coordination team is here to help remove barriers to the health concerns and goals most important to you. Care Coordination services are voluntary, and the patient may decline or stop services at any time by request to their care team member.   Care Coordination Consent Status: Patient agreed to services and verbal consent obtained.   Follow up plan:  Telephone appointment with care coordination team member scheduled for:  04/24/2023  Encounter Outcome:  Pt. Scheduled from referral   Burman Nieves, [facility] Care Coordination Care Guide Direct Dial: [phone]

## 2023-04-17 NOTE — Telephone Encounter (Signed)
Patient calling in about get a new blood pressure cup. Please advise

## 2023-04-17 NOTE — Telephone Encounter (Signed)
Patient stated she came two weeks ago because her blood pressure monitor was broken. We are currently out on office sent Rx for BP monitor to pharmacy patient will give [place] a call if her insurance will not cover

## 2023-04-23 ENCOUNTER — Telehealth: Payer: Self-pay

## 2023-04-23 NOTE — Progress Notes (Signed)
   Care Guide Note  04/23/2023 Name: Barbara [name] MRN: [id number] DOB: 1941/07/11  Referred by: [name], MD Reason for referral : Care Coordination (Outreach to schedule with pharm d )   Barbara [name] is a 82 y.o. year old female who is a primary care patient of [name], MD. Lilla Shook was referred to the pharmacist for assistance related to HTN.    Successful contact was made with the patient to discuss pharmacy services including being ready for the pharmacist to call at least 5 minutes before the scheduled appointment time, to have medication bottles and any blood sugar or blood pressure readings ready for review. The patient agreed to meet with the pharmacist via with the pharmacist via telephone visit on (date/time).  05/01/2023  Penne Lash, RMA Care Guide [facility]  [place], [place] [address] Direct Dial: [phone] [email or web address]

## 2023-04-24 NOTE — Telephone Encounter (Signed)
Patient will got to pharmacy to but BP monitor

## 2023-04-24 NOTE — Telephone Encounter (Signed)
Patient is following up. She states she went to the pharmacy for Rx but [facility] informed her that they do not take orders for BP monitors and she will just need to buy one. Patient would like to know what type of BP monitor to get. Please advise.

## 2023-04-28 ENCOUNTER — Other Ambulatory Visit: Payer: Self-pay | Admitting: Family Medicine

## 2023-04-29 ENCOUNTER — Telehealth: Payer: Self-pay | Admitting: *Deleted

## 2023-04-29 DIAGNOSIS — I480 Paroxysmal atrial fibrillation: Secondary | ICD-10-CM

## 2023-04-29 DIAGNOSIS — Z79899 Other long term (current) drug therapy: Secondary | ICD-10-CM

## 2023-04-29 NOTE — Telephone Encounter (Signed)
Called pt about scheduling overdue Amiodarone surveillance lab work. Confirms she is still taking. She will stop by the office Friday for surveillance labs.  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 Aware I will forward to device clinic to address with her.

## 2023-04-30 NOTE — Telephone Encounter (Signed)
Please have patient send manual transmission when she returns call.

## 2023-04-30 NOTE — Telephone Encounter (Signed)
Attempted to call patient to send manual transmission. No answer, LMTCB. ?

## 2023-05-01 ENCOUNTER — Other Ambulatory Visit: Payer: PPO

## 2023-05-02 ENCOUNTER — Other Ambulatory Visit (HOSPITAL_COMMUNITY): Payer: Self-pay | Admitting: Cardiology

## 2023-05-02 ENCOUNTER — Ambulatory Visit: Payer: PPO

## 2023-05-02 DIAGNOSIS — I480 Paroxysmal atrial fibrillation: Secondary | ICD-10-CM | POA: Diagnosis not present

## 2023-05-02 DIAGNOSIS — Z79899 Other long term (current) drug therapy: Secondary | ICD-10-CM | POA: Diagnosis not present

## 2023-05-02 NOTE — Telephone Encounter (Signed)
Remote transmission received and reviewed. Shows normal device function. Patient updated.   During our conversation, patient complained of right sided chest pain/burning and pressure since yesterday. Also reports of increased shortness of breath and fatigue. Patient advised to go to the ED for evaluation for concerns of chest pain. Advised patient not to drive but call 161. Patient voiced understanding and agreeable to plan.

## 2023-05-02 NOTE — Progress Notes (Signed)
05/02/2023 Name: Barbara [name] MRN: [id number] DOB: 10-09-1941  Chief Complaint  Patient presents with   Medication Management   Barbara [name] is a 82 y.o. year old female who presented for a telephone visit to transfer care to [facility] PharmD.  Subjective:  Care Team: Primary Care Provider: [name], MD  Medication Access/Adherence Current Pharmacy:  [facility] [address], [place] - [address] [place] [address] Phone: [phone] Fax: [phone]  Patient reports affordability concerns with their medications: No  Patient reports access/transportation concerns to their pharmacy: No  Patient reports adherence concerns with their medications:  No  Using weekly pill organizer and endorses good adherence  Hypertension: Current medications: furosemide 20mg  daily, hydralazine 75mg  BID, isosorbide MN ER 30mg  daily, metoprolol xl 50mg  daily -Patient states she is taking 75mg  of hydralazine in the morning and 100mg  in the evening -Patient has a validated, automated, upper arm home BP cuff -Current blood pressure readings readings: 125/79- states BP higher first thing in the morning (SBP 166 but cannot provide DBP).  This value was prior to taking BP medications. -Patient denies hypotensive s/sx including -dizziness, lightheadedness.  -Patient denies hypertensive symptoms including headache, chest pain, shortness of breath  Hyperlipidemia/ASCVD Risk Reduction Current lipid lowering medications: rosuvastatin 40mg  daily  Antiplatelet regimen: ASA 81mg  daily   Objective: Lab Results  Component Value Date   CREATININE 1.96 (H) 08/13/2022   BUN 25 (H) 08/13/2022   NA 139 08/13/2022   K 3.4 (L) 08/13/2022   CL 103 08/13/2022   CO2 28 08/13/2022   Lab Results  Component Value Date   CHOL 148 06/13/2021   HDL 57.60 06/13/2021   LDLCALC 50 10/08/2020   LDLDIRECT 54.0 06/13/2021   TRIG 244.0 (H)  06/13/2021   CHOLHDL 3 06/13/2021   Medications Reviewed Today     Reviewed by Lenna Gilford, RPH (Pharmacist) on 05/01/23 at 1516  Med List Status: <None>   Medication Order Taking? Sig Documenting Provider Last Dose Status Informant  acetaminophen (TYLENOL) 500 MG tablet [id number] Yes Take 500 mg by mouth daily as needed for headache. [provider] Taking Active Self  amiodarone (PACERONE) 200 MG tablet [id number] Yes Take 1 tablet (200 mg total) by mouth daily. Lennette Bihari, MD Taking Active   apixaban Everlene Balls) 2.5 MG TABS tablet [id number] Yes Take 1 tablet by mouth twice daily Lennette Bihari, MD Taking Active   aspirin 81 MG chewable tablet [id number] Yes Chew 1 tablet (81 mg total) by mouth daily. Leroy Sea, MD Taking Active Self  Blood Pressure Monitoring (BLOOD PRESSURE MONITOR AUTOMAT) DEVI [id number] Yes 1 Units/min by Does not apply route daily. Lennette Bihari, MD Taking Active   Cholecalciferol (VITAMIN D3) 2000 units TABS [id number] Yes Take 2,000 Units by mouth daily. [provider] Taking Active Self  dextromethorphan (DELSYM) 30 MG/5ML liquid [id number] Yes Take 15 mg by mouth daily as needed for cough. [provider] Taking Active Self           Med Note Littie Deeds, Raunel Dimartino A   Thu May 01, 2023  3:08 PM) Only when needed  docusate sodium (COLACE) 100 MG capsule [id number] Yes Take 100 mg by mouth as needed for mild constipation. [provider] Taking Active            Med Note Littie Deeds, Bear A   Thu May 01, 2023  3:08 PM) Taking daily  empagliflozin (JARDIANCE) 10  MG TABS tablet [id number] Yes Take 1 tablet (10 mg total) by mouth daily. [name], MD Taking Active   furosemide (LASIX) 20 MG tablet [id number] Yes Take 1 tablet by mouth once daily [name], MD Taking Active   hydrALAZINE (APRESOLINE) 50 MG tablet [id number] Yes Take 1.5 tablets (75 mg total) by mouth 2 (two) times daily.  Patient taking differently: Take 50  mg by mouth daily. Takes 2 tablets at night.   Lennette Bihari, MD Taking Active            Med Note Littie Deeds, [facility] A   Thu May 01, 2023  3:11 PM) 1.5 in the morning and 2 at bedtime  ipratropium (ATROVENT) 0.06 % nasal spray [id number] Yes Place 2 sprays into both nostrils 2 (two) times daily as needed (allergies). [provider] Taking Active Self  isosorbide mononitrate (IMDUR) 30 MG 24 hr tablet [id number] Yes Take 1 tablet (30 mg total) by mouth daily. Lennette Bihari, MD Taking Active   ketoconazole (NIZORAL) 2 % cream [id number] Yes APPLY 1 APPLICATION TOPICALLY TWO TIMES DAILY AS NEEDED FOR IRRITATION [name], MD Taking Active            Med Note Littie Deeds, [facility] A   Thu May 01, 2023  3:12 PM) As needed  loratadine (CLARITIN) 10 MG tablet [id number] Yes Take 10 mg by mouth daily as needed for allergies. [provider] Taking Active Self  LORazepam (ATIVAN) 0.5 MG tablet [id number] Yes TAKE 1 TABLET BY MOUTH EVERY 6 HOURS AS NEEDED FOR ANXIETY [name], MD Taking Active            Med Note Adriana Simas, [place] [facility]   Tue Feb 18, 2023  1:35 PM) Takes 1 tablet at bedtime  metoprolol succinate (TOPROL-XL) 50 MG 24 hr tablet [id number] Yes Take 1 tablet by mouth once daily Sheilah Pigeon, PA-C Taking Active   Multiple Vitamin (MULTIVITAMIN WITH MINERALS) TABS tablet [id number] Yes Take 1 tablet by mouth daily. [provider] Taking Active Self           Med Note Adriana Simas, [facility]   Tue Feb 18, 2023  1:36 PM) 2 times per week  nitroGLYCERIN (NITROSTAT) 0.4 MG SL tablet [id number] Yes DISSOLVE ONE TABLET UNDER THE TONGUE EVERY 5 MINUTES AS NEEDED FOR CHEST PAIN.  DO NOT EXCEED A TOTAL OF 3 DOSES IN 15 MINUTES Lennette Bihari, MD Taking Active   pantoprazole (PROTONIX) 40 MG tablet [id number] Yes Take 1 tablet (40 mg total) by mouth daily. Lennette Bihari, MD Taking Active   polyethylene glycol powder [facility]) 17 GM/SCOOP powder [id number] Yes Take 17 g by mouth  daily. Leroy Sea, MD Taking Active Self           Med Note Littie Deeds, [facility] A   Thu May 01, 2023  3:14 PM) If needed  potassium chloride (KLOR-CON) 10 MEQ tablet [id number] Yes Take 1 tablet (10 mEq total) by mouth daily. [name], MD Taking Active   Probiotic Product (PROBIOTIC GUMMIES PO) [id number] Yes Take 2 tablets by mouth every morning. [provider] Taking Active Self  rosuvastatin (CRESTOR) 40 MG tablet [id number] Yes Take 1 tablet (40 mg total) by mouth daily. Lennette Bihari, MD Taking Active   triamcinolone (NASACORT) 55 MCG/ACT nasal inhaler [id number] Yes Place 1 spray into both nostrils daily as needed (for allergies).  [name], MD Taking Active Self  Assessment/Plan:   Hypertension: - Currently controlled - Reviewed appropriate blood pressure monitoring technique and reviewed goal blood pressure. Recommended to check home blood pressure and heart rate daily  - Recommend to continue current regimen and regular follow-up with PCP   Hyperlipidemia/ASCVD Risk Reduction: - Currently controlled.  - Recommend to continue current regimen and regular follow-up with provider   Follow Up Plan: None scheduled but can as needed per PCP  Lenna Gilford, PharmD, DPLA

## 2023-05-03 LAB — TSH: TSH: 2.17 u[IU]/mL (ref 0.450–4.500)

## 2023-05-03 LAB — HEPATIC FUNCTION PANEL
ALT: 36 IU/L — ABNORMAL HIGH (ref 0–32)
AST: 47 IU/L — ABNORMAL HIGH (ref 0–40)
Albumin: 3.9 g/dL (ref 3.7–4.7)
Alkaline Phosphatase: 87 IU/L (ref 44–121)
Bilirubin Total: 0.4 mg/dL (ref 0.0–1.2)
Bilirubin, Direct: 0.13 mg/dL (ref 0.00–0.40)
Total Protein: 6.4 g/dL (ref 6.0–8.5)

## 2023-05-06 ENCOUNTER — Other Ambulatory Visit: Payer: Self-pay | Admitting: Occupational Therapy

## 2023-05-06 NOTE — Patient Outreach (Signed)
Aging Gracefully Program  OT Follow-Up Visit  05/06/2023  SHAQUAIL LEISHMAN Apr 11, 1941 [id number]  Visit:  2- Second Visit  Start Time:  1620 End Time:  1700 Total Minutes:  40  Readiness to Change Score :  Readiness to Change Score: 10  Durable Medical Equipment: Adaptive Equipment: Other (sheet tucker for bed) Adaptive Equipment Distribution Date: 05/06/23    Goals:   Goals Addressed             This Visit's Progress    COMPLETED: Patient Stated       She would like for it to be easier to get her sheets tucked when making the bed. A bed sheet tucker may help with this. MET  Name: Alexaundria Deol             Date: 05/06/2023  OT Action Plan: Generic  Target Problem Area:  Difficulty getting sheets tucked on her bed   Why Problem May Occur:     Mattress is heavy  Tight fitting fitted sheets  Target Goal(s):   Ease with getting sheets tucked on bed   STRATEGIES   Saving your Energy DO:  Use sheet tucker to tuck fitted sheet corners on bottom part of bed; especially the last corner which is usually the hardest. Can also use for flat sheet at bottom of bed.  PRACTICE  Based on what we have talked about, you are willing to try:  Using the sheet tucker for your bed  If an idea does not work the first time, try it again.  We may make some changes over the next few sessions, based on how they work.     Ignacia Palma, OTR/L      05/06/2023        Post Clinical Reasoning: Client Action (Goal) Two Interventions: Pt given a sheet tucker and patient practiced with it Did Client Try?: Yes Targeted Problem Area Status: A Lot Better Clinician View Of Client Situation:: Ms. Keeffe is doing well, she has to have a procedure on her throat on July 16. She was happy with the sheet tucker, now she will not have to lift the heavy mattress to put her sheets on. Client View Of His/Her Situation:: She feels she is doing good. Currently in the process of  having her kitchen remodeled and will be happy when it is completed. Next Visit Plan:: Shower education with new grab bars.  Lynden Ang, OT Aging Gracefully [phone]

## 2023-05-06 NOTE — Patient Instructions (Signed)
Name: Barbara [name]             Date: 05/06/2023  OT Action Plan: Generic  Target Problem Area:  Difficulty getting sheets tucked on her bed   Why Problem May Occur:     Mattress is heavy  Tight fitting fitted sheets  Target Goal(s):   Ease with getting sheets tucked on bed   STRATEGIES   Saving your Energy DO:  Use sheet tucker to tuck fitted sheet corners on bottom part of bed; especially the last corner which is usually the hardest. Can also use for flat sheet at bottom of bed.   PRACTICE  Based on what we have talked about, you are willing to try:  Using the sheet tucker for your bed  If an idea does not work the first time, try it again.  We may make some changes over the next few sessions, based on how they work.     Ignacia Palma, OTR/L      05/06/2023

## 2023-05-07 ENCOUNTER — Telehealth: Payer: Self-pay | Admitting: *Deleted

## 2023-05-07 DIAGNOSIS — R7989 Other specified abnormal findings of blood chemistry: Secondary | ICD-10-CM

## 2023-05-07 NOTE — Telephone Encounter (Signed)
Informed patient of results and verbal understanding expressed. Pt will stop by the office on 9/6 for follow up labs. Aware will send her this information via mychart, she appreciates this.

## 2023-05-07 NOTE — Telephone Encounter (Signed)
-----   Message from Will Jorja Loa, MD sent at 05/05/2023  1:16 PM EDT ----- LFTs elevated.  Recheck in 2 months.

## 2023-05-09 ENCOUNTER — Ambulatory Visit (INDEPENDENT_AMBULATORY_CARE_PROVIDER_SITE_OTHER): Payer: PPO

## 2023-05-09 DIAGNOSIS — I495 Sick sinus syndrome: Secondary | ICD-10-CM

## 2023-05-09 LAB — CUP PACEART REMOTE DEVICE CHECK
Battery Remaining Longevity: 136 mo
Battery Voltage: 3.07 V
Brady Statistic AP VP Percent: 0.04 %
Brady Statistic AP VS Percent: 99.95 %
Brady Statistic AS VP Percent: 0 %
Brady Statistic AS VS Percent: 0.01 %
Brady Statistic RA Percent Paced: 99.99 %
Brady Statistic RV Percent Paced: 0.04 %
Date Time Interrogation Session: 20240704230928
Implantable Lead Connection Status: 753985
Implantable Lead Connection Status: 753985
Implantable Lead Implant Date: 20230705
Implantable Lead Implant Date: 20230705
Implantable Lead Location: 753859
Implantable Lead Location: 753860
Implantable Lead Model: 3830
Implantable Lead Model: 5076
Implantable Pulse Generator Implant Date: 20230705
Lead Channel Impedance Value: 361 Ohm
Lead Channel Impedance Value: 361 Ohm
Lead Channel Impedance Value: 456 Ohm
Lead Channel Impedance Value: 494 Ohm
Lead Channel Pacing Threshold Amplitude: 1.125 V
Lead Channel Pacing Threshold Amplitude: 1.125 V
Lead Channel Pacing Threshold Pulse Width: 0.4 ms
Lead Channel Pacing Threshold Pulse Width: 0.4 ms
Lead Channel Sensing Intrinsic Amplitude: 1.375 mV
Lead Channel Sensing Intrinsic Amplitude: 1.375 mV
Lead Channel Sensing Intrinsic Amplitude: 11 mV
Lead Channel Sensing Intrinsic Amplitude: 11 mV
Lead Channel Setting Pacing Amplitude: 2.25 V
Lead Channel Setting Pacing Amplitude: 2.25 V
Lead Channel Setting Pacing Pulse Width: 0.4 ms
Lead Channel Setting Sensing Sensitivity: 0.9 mV
Zone Setting Status: 755011
Zone Setting Status: 755011

## 2023-05-12 ENCOUNTER — Telehealth: Payer: Self-pay

## 2023-05-12 ENCOUNTER — Telehealth: Payer: Self-pay | Admitting: Family Medicine

## 2023-05-12 NOTE — Patient Outreach (Signed)
Aging Gracefully Program  05/12/2023  Barbara [name] 1941-10-06 [id number]   Placed call to patient to change scheduled appointment.   Appointment changed to 05/30/2023.  Patient agreed.  Rowe Pavy RN, BSN, Careers adviser for [employer] Mobile: [phone]

## 2023-05-12 NOTE — Telephone Encounter (Signed)
Patient dropped off document Handicap Placard, to be filled out by provider. Patient requested to send it back via Call Patient to pick up within 5-days. Document is located in providers tray at front office.Please advise at Mobile [phone] (mobile)

## 2023-05-13 ENCOUNTER — Encounter: Payer: Self-pay | Admitting: Family Medicine

## 2023-05-13 ENCOUNTER — Ambulatory Visit (INDEPENDENT_AMBULATORY_CARE_PROVIDER_SITE_OTHER): Payer: PPO | Admitting: Family Medicine

## 2023-05-13 VITALS — BP 130/60 | HR 78 | Temp 98.5°F | Wt 164.0 lb

## 2023-05-13 DIAGNOSIS — I10 Essential (primary) hypertension: Secondary | ICD-10-CM

## 2023-05-13 MED ORDER — METOPROLOL SUCCINATE ER 50 MG PO TB24: 50.0000 mg | ORAL_TABLET | Freq: Two times a day (BID) | ORAL | 3 refills | Status: DC

## 2023-05-13 NOTE — Progress Notes (Signed)
   Subjective:    Patient ID: Barbara [name], female    DOB: 1940/11/29, 82 y.o.   MRN: [id number]  HPI Here to check her BP. She feels well, but she has been getting high BP reading sin the mornings (160's to 170's systolic). Then after she takes her morning medications this goes down to 120's or 130's. She currently take her Metoprolol succinate in the mornings.    Review of Systems  Constitutional: Negative.   Respiratory: Negative.    Cardiovascular: Negative.        Objective:   Physical Exam Constitutional:      Appearance: Normal appearance. She is not ill-appearing.  Cardiovascular:     Rate and Rhythm: Normal rate. Rhythm irregular.     Pulses: Normal pulses.     Heart sounds: Normal heart sounds.  Pulmonary:     Effort: Pulmonary effort is normal.     Breath sounds: Normal breath sounds.  Musculoskeletal:     Right lower leg: No edema.     Left lower leg: No edema.  Neurological:     Mental Status: She is alert.           Assessment & Plan:  Her HTN has been poorly controlled because the Metoprolol succinate is not covering her for 24 hours. We will increase this to BID. I asked her report back to [place] in one week with BP readings.  Gershon Crane, MD

## 2023-05-14 NOTE — Telephone Encounter (Signed)
Pt had a f/u appointment with Dr Clent Ridges on 05/13/23 and form was completed at the visit

## 2023-05-16 ENCOUNTER — Other Ambulatory Visit: Payer: Self-pay | Admitting: Family Medicine

## 2023-05-16 NOTE — Telephone Encounter (Signed)
Pt LOV was on 05/13/23 Last refill was done on 11/15/22 Please advise

## 2023-05-20 ENCOUNTER — Ambulatory Visit (HOSPITAL_COMMUNITY): Admission: RE | Admit: 2023-05-20 | Payer: PPO | Source: Home / Self Care | Admitting: Otolaryngology

## 2023-05-20 ENCOUNTER — Encounter (HOSPITAL_COMMUNITY): Admission: RE | Payer: Self-pay | Source: Home / Self Care

## 2023-05-20 SURGERY — LARYNGOSCOPY, DIRECT, WITH CALCIUM HYDROXYLAPATITE MICROSPHERE INJECTION
Anesthesia: General | Laterality: Bilateral

## 2023-05-27 ENCOUNTER — Other Ambulatory Visit: Payer: Self-pay | Admitting: Occupational Therapy

## 2023-05-27 NOTE — Patient Instructions (Signed)
Name: Barbara [name]    Date: 05/27/2023   OT ACTION PLAN: Bathing   Target Problem Area:   Safety in Shower    Why Problem May Occur:      Nothing to hold onto  Decreased balance in standing  Handheld shower head is too far away when she is seated on her shower seat     Target Goal(s):   Client feeling safe with showering      STRATEGIES    Saving Your Energy DO:  Use a tub bench/seat   Use the handheld shower head  Keep frequently used items within easy reach       Modifying your home environment and making it safe     DO:  Install grab bars in the shower and outside shower to assist with getting into and out of walk in shower. Also in a grab bar on the inside to hold onto when standing.  Place a rubber mat the entire length of the tub  Make sure the bathroom is well-lit    Simplifying the way you set up tasks or daily routines DO:  Plan to bathe/shower before you're overly tired  Gather everything you will need before beginning       PRACTICE   Based on what we have talked about, you are willing to try:   Keep on using the grab bars and handheld shower head in a manner that keep you the most safe.           Ignacia Palma, OTR/L       05/27/2023               Occupational Therapist                       Date

## 2023-05-27 NOTE — Patient Outreach (Signed)
Aging Gracefully Program  OT FINAL Visit  05/27/2023  BAYLIE DRAKES Jan 02, 1941 [id number]  Visit:  3- Third Visit  Start Time:  1200 End Time:  1240 Total Minutes:  40  Readiness to Change:  Readiness to Change Score: 10  Patient Education: Education Provided: Yes Education Details: "Getting up from a Fall brochure"' and Social worker at [employer]) Educated: Patient Comprehension: Verbalized Understanding  Goals:  Goals Addressed             This Visit's Progress    COMPLETED: Patient Stated       She would like to feel more safe getting into and out of her walk in shower as well as more safe while standing in shower. Two grab bars and a hand held shower holder on the grab bar on the long wall of the shower would greatly help with her feeling more safe.MET: grab bars have been installed and hand held shower holder has been installed as well.  Name: Nickia Boesen    Date: 05/27/2023   OT ACTION PLAN: Bathing   Target Problem Area:   Safety in Shower    Why Problem May Occur:      Nothing to hold onto  Decreased balance in standing  Handheld shower head is too far away when she is seated on her shower seat     Target Goal(s):   Client feeling safe with showering      STRATEGIES    Saving Your Energy DO:  Use a tub bench/seat   Use the handheld shower head  Keep frequently used items within easy reach       Modifying your home environment and making it safe     DO:  Install grab bars in the shower and outside shower to assist with getting into and out of walk in shower. Also in a grab bar on the inside to hold onto when standing.  Place a rubber mat the entire length of the tub  Make sure the bathroom is well-lit    Simplifying the way you set up tasks or daily routines DO:  Plan to bathe/shower before you're overly tired  Gather everything you will need before beginning       PRACTICE   Based on what we have talked about,  you are willing to try:   Keep on using the grab bars and handheld shower head in a manner that keep you the most safe.           Ignacia Palma, OTR/L       05/27/2023               Occupational Therapist                     Date        Post Clinical Reasoning: Client Action (Goal) One Interventions: Grab bars and hand hels shower head were installed for patient Did Client Try?: Yes Targeted Problem Area Status: A Lot Better Clinician View Of Client Situation:: Ms. Stay is doing well. She is still concerned about her higher BP in the morning when she wakes up--she and her MD are working on this. She is happy with the bathroom work and her sheet tucker. It has been a pleasure working with Ms. Boom. Client View Of His/Her Situation:: She feels she is doing well. Looking into a procedure that she needs to have done that she had to cancel due to her higher BP. Kitchen remodel  still underway Next Visit Plan:: This was our last viist--both goals met and all AG education provided  [place], [place] Aging Gracefully [phone]

## 2023-05-28 NOTE — Progress Notes (Signed)
Remote pacemaker transmission.   

## 2023-05-30 ENCOUNTER — Other Ambulatory Visit: Payer: Self-pay

## 2023-05-30 NOTE — Patient Instructions (Signed)
Visit Information  Thank you for taking time to visit with me today. Please don't hesitate to contact me if I can be of assistance to you before our next scheduled home appointment.  Following are the goals we discussed today:   Goals Addressed               This Visit's Progress     COMPLETED: Patient Stated (pt-stated)        Goal:  Patient will have a bowel movement 2-3 times per week in the next 120days.   02/18/2023 Assessment: reviewed bowel history of only 1 bowel movement per week. 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   Interventions: Reviewed importance of exercise, fiber, hydration and use of stool softeners.  Encouraged patient to take her colace daily.  Provided [facility] calendar and daily tool. Reviewed Afib zones, CHF zones. Reviewed when to call MD.  Plan: Follow up home visit 03/21/2023.   Rowe Pavy, RN, BSN, CEN [facility] Community Care Coordinator [phone]   CLIENT/RN ACTION PLAN - GENERIC - [facility] AGRNGENERIC)  Registered Nurse:  Rowe Pavy RN Date:  02/18/2023  Client Name  Barbara [name] Client ID:    Target Area:  Bowels   Why Problem May Occur: Chronic problem with lazy bowel.   Target Goal:  Patient will report having a bowel movement 2-3 times per week.  STRATEGIES Coping Strategies: Ideas  Be more aware of date that you have a bowel movement. Write the date of your bowel movements on a calendar.               Prevention Ideas   Increase fiber   Increase water intake   Take colace daily.          PRACTICE It is important to practice the strategies so we can determine if they will be effective in helping to reach the goal.    Follow these specific recommendations:   Take colace daily.  Keep track of when you are having a bowel movement.   If strategy does not work the first time, try it again.     We may make some changes over the next few sessions.       Rowe Pavy RN, BSN, CEN RN Case Production designer, theatre/television/film for [employer] [facility] Mobile: [phone]    03/21/2023: Assessment:  Patient keeping a log of Bm's. Continues to take her colace for constipation.  Patient reports that she has to continue to push it out.  Interventions: reviewed BM log.  Week 1    (3)                                                      Week 2  ( 0)                                                      Week 3 ( 4)  Week 4  ( 1)                                                      Week 5    (2)  Reviewed and demonstrated home exercise plan.  Encouraged exercise and hydration.  Encouraged patient to continue to keep log. Reviewed benefit of taking colace 100 mg twice a day.  Plan: Follow up home visit planned for 04/15/2023  Rowe Pavy RN, BSN, CEN RN Case Manager for [facility] Mobile: [phone]   04/15/23 Assessment: Patient is taking her medications as prescribed. Recording her BM. Doing her home exercises. Patient reports that she will be having throat surgery in a few weeks to help with her hoarseness.   Interventions: Reviewed BP and BM log.  Encouraged patient to continue to take her stool softeners and log her BM's.   Plan: next home visit planned for 05/27/2023  Rowe Pavy RN, BSN, CEN RN Case Manager for [employer] [facility] Mobile: [phone]  05/30/2023  Assessment: Reviewed BM log.  BM's eery 2-3 days. Much improved. Continues to take her medications as prescribed.    Interventions: reviewed importance of continuing to do her home exercises.  Reviewed importance of continuing to take her medications as prescribed.    Plan:  Goals met.  Rowe Pavy RN, BSN, CEN RN Case Production designer, theatre/television/film for [facility] Mobile: [phone]           If you are experiencing a Mental Health or Behavioral Health Crisis or need someone to talk to, please call the Suicide and  Crisis Lifeline: 988 call the [place] National Suicide Prevention Lifeline: 5746350161 or TTY: 629 167 0261 TTY 4018027494) to talk to a trained counselor call 1-800-273-TALK (toll free, 24 hour hotline) go to [facility] [address], [place] [phone]) call 911   The patient verbalized understanding of instructions, educational materials, and care plan provided today and agreed to receive a mailed copy of patient instructions, educational materials, and care plan.   Rowe Pavy RN, BSN, Careers adviser for [employer] Mobile: [phone]

## 2023-05-30 NOTE — Patient Outreach (Signed)
Aging Gracefully Program  RN Visit  05/30/2023  Barbara [name] 03/01/1941 [id number]  Visit:  RN Visit Number: 4- Fourth Visit  RN TIME CALCULATION: Start TIme:  RN Start Time Calculation: 1305 End Time:  RN Stop Time Calculation: 1400 Total Minutes:  RN Time Calculation: 55  Readiness To Change Score:     Universal RN Interventions: Calendar Distribution: Yes Exercise Review: Yes Medications: Yes Medication Changes: Yes Mood: Yes Pain: Yes PCP Advocacy/Support: No Fall Prevention: Yes Incontinence: Yes Clinician View Of Client Situation: Patient ambuatory with cane to the front door.   New cabinets are put in by son.  Awake and alert. Client View Of His/Her Situation: No recent falls.  reports that she has been having bowel movements very 2-3 days.   Reports BP are higher in the ams with AM range of 160/80's.   Reports that now she is taking her BP in the am and pm but seems not to make a difference. BP during home visit with patients home monitor was 128/57.   [employer] made bathroom modifications with grab bar and moveing the holder for the shower wand.  Healthcare Provider Communication: none    Clinician View of Client Situation: Clinician View Of Client Situation: Patient ambuatory with cane to the front door.   New cabinets are put in by son.  Awake and alert. Client's View of His/Her Situation: Client View Of His/Her Situation: No recent falls.  reports that she has been having bowel movements very 2-3 days.   Reports BP are higher in the ams with AM range of 160/80's.   Reports that now she is taking her BP in the am and pm but seems not to make a difference. BP during home visit with patients home monitor was 128/57.   [employer] made bathroom modifications with grab bar and moveing the holder for the shower wand.  Medication Assessment:denies any changes to medications    OT Update: All AG visits and home modifications  completed.   Session Summary: all visits completed. Patient doing well.    Goals Addressed               This Visit's Progress     COMPLETED: Patient Stated (pt-stated)        Goal:  Patient will have a bowel movement 2-3 times per week in the next 120days.   02/18/2023 Assessment: reviewed bowel history of only 1 bowel movement per week. 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   Interventions: Reviewed importance of exercise, fiber, hydration and use of stool softeners.  Encouraged patient to take her colace daily.  Provided [facility] calendar and daily tool. Reviewed Afib zones, CHF zones. Reviewed when to call MD.  Plan: Follow up home visit 03/21/2023.   Rowe Pavy, RN, BSN, CEN [facility] Community Care Coordinator [phone]   CLIENT/RN ACTION PLAN - GENERIC - [facility])  Registered Nurse:  Rowe Pavy RN Date:  02/18/2023  Client Name  Barbara [name] Client ID:    Target Area:  Bowels   Why Problem May Occur: Chronic problem with lazy bowel.   Target Goal:  Patient will report having a bowel movement 2-3 times per week.  STRATEGIES Coping Strategies: Ideas  Be more aware of date that you have a bowel movement. Write the date of your bowel movements on a calendar.               Prevention Ideas   Increase fiber   Increase water intake  Take colace daily.          PRACTICE It is important to practice the strategies so we can determine if they will be effective in helping to reach the goal.    Follow these specific recommendations:   Take colace daily.  Keep track of when you are having a bowel movement.   If strategy does not work the first time, try it again.     We may make some changes over the next few sessions.       Rowe Pavy RN, BSN, CEN RN Case Production designer, theatre/television/film for [employer] [facility] Mobile: [phone]    03/21/2023: Assessment:  Patient keeping a log of Bm's. Continues to take her colace for constipation.   Patient reports that she has to continue to push it out.  Interventions: reviewed BM log.  Week 1    (3)                                                      Week 2  ( 0)                                                      Week 3 ( 4)                                                      Week 4  ( 1)                                                      Week 5    (2)  Reviewed and demonstrated home exercise plan.  Encouraged exercise and hydration.  Encouraged patient to continue to keep log. Reviewed benefit of taking colace 100 mg twice a day.  Plan: Follow up home visit planned for 04/15/2023  Rowe Pavy RN, BSN, CEN RN Case Manager for [facility] Mobile: [phone]   04/15/23 Assessment: Patient is taking her medications as prescribed. Recording her BM. Doing her home exercises. Patient reports that she will be having throat surgery in a few weeks to help with her hoarseness.   Interventions: Reviewed BP and BM log.  Encouraged patient to continue to take her stool softeners and log her BM's.   Plan: next home visit planned for 05/27/2023  Rowe Pavy RN, BSN, CEN RN Case Manager for [employer] [facility] Mobile: [phone]  05/30/2023  Assessment: Reviewed BM log.  BM's eery 2-3 days. Much improved. Continues to take her medications as prescribed.    Interventions: reviewed importance of continuing to do her home exercises.  Reviewed importance of continuing to take her medications as prescribed.    Plan:  Goals met.  Rowe Pavy RN, BSN, Careers adviser for [employer]: [phone]        Marchelle Folks  Adriana Simas, Charity fundraiser, Scientist, research (physical sciences), [employer] [facility] [phone]

## 2023-06-09 ENCOUNTER — Telehealth: Payer: Self-pay | Admitting: Cardiology

## 2023-06-09 NOTE — Telephone Encounter (Signed)
Pt would like a callback regarding her Pacemaker and Life Alert necklace. She would ike to know if the two will interfere with each other. Please advise

## 2023-06-09 NOTE — Telephone Encounter (Signed)
Called patient to advise she may use the life alert. Patient appreciative of call.

## 2023-06-11 ENCOUNTER — Ambulatory Visit: Payer: PPO | Admitting: Podiatry

## 2023-06-11 ENCOUNTER — Encounter: Payer: Self-pay | Admitting: Podiatry

## 2023-06-11 DIAGNOSIS — M79675 Pain in left toe(s): Secondary | ICD-10-CM | POA: Diagnosis not present

## 2023-06-11 DIAGNOSIS — B351 Tinea unguium: Secondary | ICD-10-CM | POA: Diagnosis not present

## 2023-06-11 DIAGNOSIS — M79674 Pain in right toe(s): Secondary | ICD-10-CM | POA: Diagnosis not present

## 2023-06-11 NOTE — Progress Notes (Signed)
  Subjective:  Patient ID: Barbara [name], female    DOB: [birth year],   MRN: [id number]  Chief Complaint  Patient presents with   Nail Problem    RFC    82 y.o. female presents for concern of thickened elongated and painful nails that are difficult to trim. Requesting to have them trimmed today.   PCP:  [name], MD    . Denies any other pedal complaints. Denies n/v/f/c.   Past Medical History:  Diagnosis Date   A-fib [facility])    Allergy    CAD (coronary artery disease)    sees Dr. Nicki Guadalajara  cardiac stents - 2000   CHF (congestive heart failure) (HCC)    Colon polyps    Complication of anesthesia    rash/hives with "caines"   Dyspnea    02/12/18 " when my heart gets out of rhythm, it has not been out of rhythym- since I have been on Tikosyn (11/2017)   Dysrhythmia    afib fib   GERD (gastroesophageal reflux disease)    takes OTC- Omeprazole- prn   Heart murmur    History of kidney stones    History of stress test    show normal perfusion without scar or ischemia, post EF 68%   Hx of echocardiogram    show an EF 55%-60% range with grade 1 diastolic dysfunction, she had mitral anular calcification with mild MR, moderate LA dilation and mild pulmonary hypertension with a PA estimated pressure of 39mm   Hyperlipidemia    Hypertension    NSTEMI (non-ST elevated myocardial infarction) (HCC)    Osteoarthritis    Pacemaker    Pneumonia    hx of 2015    PONV (postoperative nausea and vomiting)    Sleep apnea     Objective:  Physical Exam: Vascular: DP/PT pulses 2/4 bilateral. CFT <3 seconds. Normal hair growth on digits. No edema.  Skin. No lacerations or abrasions bilateral feet. Nails 1-5 bilateral thickened elongated and dystrophic with subungual debris.  Musculoskeletal: MMT 5/5 bilateral lower extremities in DF, PF, Inversion and Eversion. Deceased ROM in DF of ankle joint.  Neurological: Sensation intact to light touch.   Assessment:   1. Pain due to  onychomycosis of toenails of both feet      Plan:  Patient was evaluated and treated and all questions answered.  -Mechanically debrided all nails 1-5 bilateral using sterile nail nipper and filed with dremel without incident  -Answered all patient questions -Patient to return  in 3 months for at risk foot care -Patient advised to call the office if any problems or questions arise in the meantime.   Louann Sjogren, DPM

## 2023-06-19 ENCOUNTER — Encounter (INDEPENDENT_AMBULATORY_CARE_PROVIDER_SITE_OTHER): Payer: Self-pay

## 2023-06-27 ENCOUNTER — Other Ambulatory Visit: Payer: Self-pay | Admitting: Occupational Therapy

## 2023-06-29 NOTE — Patient Outreach (Signed)
Aging Gracefully Program  OT FINAL Visit  06/29/2023  FELISSA GABBERT 1941/03/28 [id number]  Visit:  4- Fourth Visit  Start Time:  1400 End Time:  1445 Total Minutes:  45  Readiness to Change:  Readiness to Change Score: 10  Patient Education: Education Provided: Yes Education Details: Tips for Aging at Home booklet provided [employer]) Educated: Patient Comprehension: Verbalized Understanding  Post Clinical Reasoning: Diplomatic Services operational officer Of Client Situation:: Had one more visit with Ms. Fracassi to provide her with the booklet we give out in Aging Gracefully. We had a nice visit and she is slowly getting her kitchen back together from a re-model. Client View Of His/Her Situation:: She says she continues to do well and feels good. She is ready to have her full kitchen back in working order. Next Visit Plan:: This was my last visit.   Lynden Ang, OT [facility] [phone]

## 2023-07-02 DIAGNOSIS — M65831 Other synovitis and tenosynovitis, right forearm: Secondary | ICD-10-CM | POA: Diagnosis not present

## 2023-07-02 DIAGNOSIS — M659 Synovitis and tenosynovitis, unspecified: Secondary | ICD-10-CM | POA: Diagnosis not present

## 2023-07-02 DIAGNOSIS — M65832 Other synovitis and tenosynovitis, left forearm: Secondary | ICD-10-CM | POA: Diagnosis not present

## 2023-07-02 DIAGNOSIS — G4733 Obstructive sleep apnea (adult) (pediatric): Secondary | ICD-10-CM | POA: Diagnosis not present

## 2023-07-03 DIAGNOSIS — M65931 Unspecified synovitis and tenosynovitis, right forearm: Secondary | ICD-10-CM | POA: Insufficient documentation

## 2023-07-03 DIAGNOSIS — M65941 Unspecified synovitis and tenosynovitis, right hand: Secondary | ICD-10-CM | POA: Insufficient documentation

## 2023-07-08 ENCOUNTER — Ambulatory Visit (INDEPENDENT_AMBULATORY_CARE_PROVIDER_SITE_OTHER): Payer: PPO | Admitting: Family Medicine

## 2023-07-08 VITALS — BP 140/73 | HR 78

## 2023-07-08 DIAGNOSIS — Z0000 Encounter for general adult medical examination without abnormal findings: Secondary | ICD-10-CM

## 2023-07-08 NOTE — Patient Instructions (Addendum)
I really enjoyed getting to talk with you today! I am available on Tuesdays and Thursdays for virtual visits if you have any questions or concerns, or if I can be of any further assistance.   CHECKLIST FROM ANNUAL WELLNESS VISIT:  -Follow up (please call to schedule if not scheduled after visit):   -in person visit with Dr. Clent Ridges tomorrow as planned for the blood pressure, bring cuff and log with you, along with all of your medications   -yearly for annual wellness visit with primary care office  Here is a list of your preventive care/health maintenance measures and the plan for each if any are due:  PLAN For any measures below that may be due:   Health Maintenance  Topic Date Due   Zoster Vaccines- Shingrix (1 of 2) Never done   INFLUENZA VACCINE  06/05/2023   COVID-19 Vaccine (4 - 2023-24 season) 07/06/2023   Medicare Annual Wellness (AWV)  07/07/2024   DTaP/Tdap/Td (2 - Td or Tdap) 11/07/2027   Pneumonia Vaccine 65+ Years old  Completed   DEXA SCAN  Completed   HPV VACCINES  Aged Out    -See a dentist at least yearly  -Get your eyes checked and then per your eye specialist's recommendations  -Other issues addressed today:   -I have included below further information regarding a healthy whole foods based diet, physical activity guidelines for adults, stress management and opportunities for social connections. I hope you find this information useful.   -----------------------------------------------------------------------------------------------------------------------------------------------------------------------------------------------------------------------------------------------------------  NUTRITION: -eat real food: lots of colorful vegetables (half the plate) and fruits -5-7 servings of vegetables and fruits per day (fresh or steamed is best), exp. 2 servings of vegetables with lunch and dinner and 2 servings of fruit per day. Berries and greens such as kale and  collards are great choices.  -consume on a regular basis: whole grains (make sure first ingredient on label contains the word "whole"), fresh fruits, fish, nuts, seeds, healthy oils (such as olive oil, avocado oil, grape seed oil) -may eat small amounts of dairy and lean meat on occasion, but avoid processed meats such as ham, bacon, lunch meat, etc. -drink water -try to avoid fast food and pre-packaged foods, processed meat -most experts advise limiting sodium to < 2300mg  per day, should limit further is any chronic conditions such as high blood pressure, heart disease, diabetes, etc. The American Heart Association advised that < 1500mg  is is ideal -try to avoid foods that contain any ingredients with names you do not recognize  -try to avoid sugar/sweets (except for the natural sugar that occurs in fresh fruit) -try to avoid sweet drinks -try to avoid white rice, white bread, pasta (unless whole grain), white or yellow potatoes  EXERCISE GUIDELINES FOR ADULTS: -if you wish to increase your physical activity, do so gradually and with the approval of your doctor -STOP and seek medical care immediately if you have any chest pain, chest discomfort or trouble breathing when starting or increasing exercise  -move and stretch your body, legs, feet and arms when sitting for long periods -Physical activity guidelines for optimal health in adults: -least 150 minutes per week of aerobic exercise (can talk, but not sing) once approved by your doctor, 20-30 minutes of sustained activity or two 10 minute episodes of sustained activity every day.  -resistance training at least 2 days per week if approved by your doctor -balance exercises 3+ days per week:   Stand somewhere where you have something sturdy to hold onto if  you lose balance.    1) lift up on toes, start with 5x per day and work up to 20x   2) stand and lift on leg straight out to the side so that foot is a few inches of the floor, start with 5x  each side and work up to 20x each side   3) stand on one foot, start with 5 seconds each side and work up to 20 seconds on each side  If you need ideas or help with getting more active:  -Silver sneakers https://tools.silversneakers.com  -Walk with a Doc: http://www.duncan-williams.com/  -try to include resistance (weight lifting/strength building) and balance exercises twice per week: or the following link for ideas: http://castillo-powell.com/  BuyDucts.dk  STRESS MANAGEMENT: -can try meditating, or just sitting quietly with deep breathing while intentionally relaxing all parts of your body for 5 minutes daily -if you need further help with stress, anxiety or depression please follow up with your primary doctor or contact the wonderful folks at [facility]: [phone]  SOCIAL CONNECTIONS: -options in [place] if you wish to engage in more social and exercise related activities:  -Silver sneakers https://tools.silversneakers.com  -Walk with a Doc: http://www.duncan-williams.com/  -Check out the [facility] Active Adults 50+ section on the [employer] (hiking clubs, book clubs, cards and games, chess, exercise classes, aquatic classes and much more) - see the website for details: https://www.[place].gov/departments/parks-recreation/active-adults50  -YouTube has lots of exercise videos for different ages and abilities as well  -[facility] (a variety of indoor and outdoor inperson activities for adults). [phone]. [address].  -Virtual Online Classes (a variety of topics): see seniorplanet.org or call [phone]  -consider volunteering at a school, hospice center, church, senior center or elsewhere

## 2023-07-08 NOTE — Progress Notes (Signed)
PATIENT CHECK-IN and HEALTH RISK ASSESSMENT QUESTIONNAIRE:  -completed by phone/video for upcoming Medicare Preventive Visit  Pre-Visit Check-in: 1)Vitals (height, wt, BP, etc) - record in vitals section for visit on day of visit Request home vitals (wt, BP, etc.) and enter into vitals, THEN update Vital Signs SmartPhrase below at the top of the HPI. See below.  2)Review and Update Medications, Allergies PMH, Surgeries, Social history in Epic 3)Hospitalizations in the last year with date/reason?  No   4)Review and Update Care Team (patient's specialists) in Epic 5) Complete PHQ9 in Epic  6) Complete Fall Screening in Epic 7)Review all Health Maintenance Due and order under PCP if not done.  8)Medicare Wellness Questionnaire: Answer theses question about your habits: Do you drink alcohol?  No   Have you ever smoked? Yes  Quit date if applicable? 1995  How many packs a day do/did you smoke? 2-3 cigarretes daily  Do you use smokeless tobacco? no  Do you use an illicit drugs? No  Do you exercises? Tries IF so, what type and how many days/minutes per week? Walks around house and nurse helps, uses cane   Are you sexually active?  No Number of partners? n/a Typical breakfast: instant grits, OJ or cereal and banana and OJ  Typical lunch  Typical dinner meat, starch and green veg Typical snacks: nuts or low sodium chips   Beverages:  water,  tea   Answer theses question about you: Can you perform most household chores? No  Do you find it hard to follow a conversation in a noisy room? No  Do you often ask people to speak up or repeat themselves? No  Do you feel that you have a problem with memory? No  Do you balance your checkbook and or bank acounts? Yes  Do you feel safe at home? Yes  Last dentist visit?  July  2024 Do you need assistance with any of the following: Please note if so no   Driving?  Feeding yourself?  Getting from bed to chair?  Getting to the toilet?  Bathing or  showering?  Dressing yourself?  Managing money?  Climbing a flight of stairs  Preparing meals?  Do you have Advanced Directives in place (Living Will, Healthcare Power or Attorney)?  Yes    Last eye Exam and location? Goes Nov 5, Dr Elmer Picker   Do you currently use prescribed or non-prescribed narcotic or opioid pain medications? No   Do you have a history or close family history of breast, ovarian, tubal or peritoneal cancer or a family member with BRCA (breast cancer susceptibility 1 and 2) gene mutations? No   Not able to get vitals. Request home vitals (wt, BP, etc.) and enter into vitals, THEN update Vital Signs SmartPhrase below at the top of the HPI. See below.   Nurse/Assistant Credentials/time stamp: Tora Perches CMA 12:21 pm   ----------------------------------------------------------------------------------------------------------------------------------------------------------------------------------------------------------------------  Vital Signs: Vital signs are patient reported.   MEDICARE ANNUAL PREVENTIVE VISIT WITH PROVIDER: (Welcome to Medicare, initial annual wellness or annual wellness exam)  Virtual Visit via Phone Note  I connected with Lilla Shook on 07/08/23 by phone  and verified that I am speaking with the correct person using two identifiers.  Location patient: home Location provider:work or home office Persons participating in the virtual visit: patient, provider  Concerns and/or follow up today: BP has been running high. Seeing PCP tomorrow about this. Was 140 over 73 yesterday, but is higher before taking medications in the morning. Denies  CP, SOB, swelling, vision changes, HAs.   See HM section in Epic for other details of completed HM.    ROS: negative for report of fevers, unintentional weight loss, vision changes, vision loss, hearing loss or change, chest pain, sob, hemoptysis, melena, hematochezia, hematuria, falls, bleeding or  bruising, thoughts of suicide or self harm, memory loss  Patient-completed extensive health risk assessment - reviewed and discussed with the patient: See Health Risk Assessment completed with patient prior to the visit either above or in recent phone note. This was reviewed in detailed with the patient today and appropriate recommendations, orders and referrals were placed as needed per Summary below and patient instructions.   Review of Medical History: -PMH, PSH, Family History and current specialty and care providers reviewed and updated and listed below   Patient Care Team: [name], MD as PCP - General Lennette Bihari, MD as PCP - Cardiology (Cardiology) Regan Lemming, MD as PCP - Electrophysiology (Cardiology)   Past Medical History:  Diagnosis Date   A-fib [facility])    Allergy    CAD (coronary artery disease)    sees Dr. Nicki Guadalajara  cardiac stents - 2000   CHF (congestive heart failure) [facility])    Colon polyps    Complication of anesthesia    rash/hives with "caines"   Dyspnea    02/12/18 " when my heart gets out of rhythm, it has not been out of rhythym- since I have been on Tikosyn (11/2017)   Dysrhythmia    afib fib   GERD (gastroesophageal reflux disease)    takes OTC- Omeprazole- prn   Heart murmur    History of kidney stones    History of stress test    show normal perfusion without scar or ischemia, post EF 68%   Hx of echocardiogram    show an EF 55%-60% range with grade 1 diastolic dysfunction, she had mitral anular calcification with mild MR, moderate LA dilation and mild pulmonary hypertension with a PA estimated pressure of 39mm   Hyperlipidemia    Hypertension    NSTEMI (non-ST elevated myocardial infarction) (HCC)    Osteoarthritis    Pacemaker    Pneumonia    hx of 2015    PONV (postoperative nausea and vomiting)    Sleep apnea     Past Surgical History:  Procedure Laterality Date   CARDIAC CATHETERIZATION     11/2017   cardiac stents   2000   COLONOSCOPY  01-05-14   per Dr. Dorena Cookey, clear, no repeats needed    COLONOSCOPY     CORONARY STENT PLACEMENT  2000   in LAD   CYSTOSCOPY WITH RETROGRADE PYELOGRAM, URETEROSCOPY AND STENT PLACEMENT Left 06/11/2022   Procedure: CYSTOSCOPY WITH RETROGRADE PYELOGRAM, LEFT STENT PLACEMENT;  Surgeon: Crista Elliot, MD;  Location: [facility];  Service: Urology;  Laterality: Left;   CYSTOSCOPY/URETEROSCOPY/HOLMIUM LASER/STENT PLACEMENT Left 06/25/2022   Procedure: CYSTOSCOPY LEFT URETEROSCOPY/HOLMIUM LASER/STENT PLACEMENT;  Surgeon: Bjorn Pippin, MD;  Location: [facility];  Service: Urology;  Laterality: Left;  1 HR FOR THIS CASE   DIRECT LARYNGOSCOPY WITH RADIAESSE INJECTION N/A 02/13/2018   Procedure: DIRECT LARYNGOSCOPY WITH RADIAESSE INJECTION;  Surgeon: Christia Reading, MD;  Location: [facility] OR;  Service: ENT;  Laterality: N/A;   EYE SURGERY Left    CATARACT REMOVAL   KNEE ARTHROSCOPY Left 01/06/2015   Procedure: LEFT KNEE ARTHROSCOPY, abrasion chondroplasty of the medial femerol condryl,medial and lateral menisectomy, microfracture , synovectomy of the suprpatellar pouch;  Surgeon: Ranee Gosselin, MD;  Location: [facility];  Service: Orthopedics;  Laterality: Left;   LEFT HEART CATH AND CORONARY ANGIOGRAPHY N/A 02/26/2019   Procedure: LEFT HEART CATH AND CORONARY ANGIOGRAPHY;  Surgeon: Lennette Bihari, MD;  Location: [facility];  Service: Cardiovascular;  Laterality: N/A;   LEFT HEART CATH AND CORONARY ANGIOGRAPHY N/A 05/06/2022   Procedure: LEFT HEART CATH AND CORONARY ANGIOGRAPHY;  Surgeon: Yvonne Kendall, MD;  Location: [facility];  Service: Cardiovascular;  Laterality: N/A;   MICROLARYNGOSCOPY W/VOCAL CORD INJECTION N/A 08/07/2018   Procedure: MICROLARYNGOSCOPY WITH VOCAL CORD INJECTION OF PROLARYN;  Surgeon: Christia Reading, MD;  Location: [facility] OR;  Service: ENT;  Laterality: N/A;  JET VENTILATION   PACEMAKER IMPLANT N/A 05/08/2022   Procedure: PACEMAKER IMPLANT;  Surgeon: Regan Lemming, MD;  Location: [facility];  Service: Cardiovascular;  Laterality: N/A;   REVERSE SHOULDER ARTHROPLASTY Right 03/23/2020   Procedure: REVERSE SHOULDER ARTHROPLASTY;  Surgeon: Yolonda Kida, MD;  Location: [facility] OR;  Service: Orthopedics;  Laterality: Right;   VAGINAL HYSTERECTOMY  1971    Social History   Socioeconomic History   Marital status: Widowed    Spouse name: Not on file   Number of children: 2   Years of education: college   Highest education level: High school graduate  Occupational History   Occupation: Retired  Tobacco Use   Smoking status: Former    Current packs/day: 0.00    Types: Cigarettes    Start date: 11/04/1956    Quit date: 11/04/1996    Years since quitting: 26.6   Smokeless tobacco: Never  Vaping Use   Vaping status: Never Used  Substance and Sexual Activity   Alcohol use: No    Alcohol/week: 0.0 standard drinks of alcohol   Drug use: No   Sexual activity: Not Currently  Other Topics Concern   Not on file  Social History Narrative   Not on file   Social Determinants of Health   Financial Resource Strain: Low Risk  (08/05/2022)   Overall Financial Resource Strain (CARDIA)    Difficulty of Paying Living Expenses: Not hard at all  Food Insecurity: No Food Insecurity (08/05/2022)   Hunger Vital Sign    Worried About Running Out of Food in the Last Year: Never true    Ran Out of Food in the Last Year: Never true  Transportation Needs: No Transportation Needs (08/05/2022)   PRAPARE - Administrator, Civil Service (Medical): No    Lack of Transportation (Non-Medical): No  Physical Activity: Insufficiently Active (07/02/2022)   Exercise Vital Sign    Days of Exercise per Week: 1 day    Minutes of Exercise per Session: 20 min  Stress: No Stress Concern Present (07/02/2022)   [employer] of Occupational Health - Occupational Stress Questionnaire    Feeling of Stress : Not at all  Social Connections: Moderately  Integrated (07/02/2022)   Social Connection and Isolation Panel [NHANES]    Frequency of Communication with Friends and Family: More than three times a week    Frequency of Social Gatherings with Friends and Family: More than three times a week    Attends Religious Services: More than 4 times per year    Active Member of [employer] or Organizations: Yes    Attends Banker Meetings: More than 4 times per year    Marital Status: Widowed  Intimate Partner Violence: Not At Risk (07/02/2022)   Humiliation, Afraid,  Rape, and Kick questionnaire    Fear of Current or Ex-Partner: No    Emotionally Abused: No    Physically Abused: No    Sexually Abused: No    Family History  Problem Relation Age of Onset   Cancer Maternal Grandmother    Heart disease Maternal Grandfather     Current Outpatient Medications on File Prior to Visit  Medication Sig Dispense Refill   acetaminophen (TYLENOL) 500 MG tablet Take 500 mg by mouth daily as needed for headache.     amiodarone (PACERONE) 200 MG tablet Take 1 tablet (200 mg total) by mouth daily. 90 tablet 3   apixaban (ELIQUIS) 2.5 MG TABS tablet Take 1 tablet by mouth twice daily 60 tablet 5   aspirin 81 MG chewable tablet Chew 1 tablet (81 mg total) by mouth daily. 30 tablet 0   Blood Pressure Monitoring (BLOOD PRESSURE MONITOR AUTOMAT) DEVI 1 Units/min by Does not apply route daily. 1 each 0   Cholecalciferol (VITAMIN D3) 2000 units TABS Take 2,000 Units by mouth daily.     dextromethorphan (DELSYM) 30 MG/5ML liquid Take 15 mg by mouth daily as needed for cough.     docusate sodium (COLACE) 100 MG capsule Take 100 mg by mouth as needed for mild constipation.     empagliflozin (JARDIANCE) 10 MG TABS tablet Take 1 tablet (10 mg total) by mouth daily. 90 tablet 3   furosemide (LASIX) 20 MG tablet Take 1 tablet by mouth once daily 30 tablet 0   hydrALAZINE (APRESOLINE) 50 MG tablet Take 1.5 tablets (75 mg total) by mouth 2 (two) times daily. (Patient  taking differently: Take 50 mg by mouth daily. Takes 2 tablets at night.) 270 tablet 3   ipratropium (ATROVENT) 0.06 % nasal spray Place 2 sprays into both nostrils 2 (two) times daily as needed (allergies).  5   isosorbide mononitrate (IMDUR) 30 MG 24 hr tablet Take 1 tablet (30 mg total) by mouth daily. 90 tablet 3   ketoconazole (NIZORAL) 2 % cream APPLY 1 APPLICATION TOPICALLY TWO TIMES DAILY AS NEEDED FOR IRRITATION 60 g 0   loratadine (CLARITIN) 10 MG tablet Take 10 mg by mouth daily as needed for allergies.     LORazepam (ATIVAN) 0.5 MG tablet TAKE 1 TABLET BY MOUTH EVERY 6 HOURS AS NEEDED FOR ANXIETY 60 tablet 5   metoprolol succinate (TOPROL-XL) 50 MG 24 hr tablet Take 1 tablet (50 mg total) by mouth in the morning and at bedtime. 90 tablet 3   nitroGLYCERIN (NITROSTAT) 0.4 MG SL tablet DISSOLVE ONE TABLET UNDER THE TONGUE EVERY 5 MINUTES AS NEEDED FOR CHEST PAIN.  DO NOT EXCEED A TOTAL OF 3 DOSES IN 15 MINUTES 25 tablet 0   pantoprazole (PROTONIX) 40 MG tablet Take 1 tablet (40 mg total) by mouth daily. 90 tablet 3   polyethylene glycol powder (GLYCOLAX/MIRALAX) 17 GM/SCOOP powder Take 17 g by mouth daily. 238 g 0   potassium chloride (KLOR-CON) 10 MEQ tablet Take 1 tablet (10 mEq total) by mouth daily. 90 tablet 3   Probiotic Product (PROBIOTIC GUMMIES PO) Take 2 tablets by mouth every morning.     rosuvastatin (CRESTOR) 40 MG tablet Take 1 tablet (40 mg total) by mouth daily. 90 tablet 1   triamcinolone (NASACORT) 55 MCG/ACT nasal inhaler Place 1 spray into both nostrils daily as needed (for allergies).      Multiple Vitamin (MULTIVITAMIN WITH MINERALS) TABS tablet Take 1 tablet by mouth daily. (Patient not taking: Reported  on 07/08/2023)     No current facility-administered medications on file prior to visit.    Allergies  Allergen Reactions   Lidocaine Anaphylaxis   Morphine Other (See Comments)    "Body shuts down"   Procaine Hcl Anaphylaxis, Rash and Other (See Comments)     "Anything with 'caine' in it "   Sulfonamide Derivatives Hives   Amlodipine Swelling   Norco [Hydrocodone-Acetaminophen] Nausea And Vomiting and Other (See Comments)    States does ok with IV form    Tramadol Nausea And Vomiting   Vytorin [Ezetimibe-Simvastatin] Other (See Comments)    Joint pain   Tape Itching and Other (See Comments)    Patient prefers either paper tape or Coban wrap       Physical Exam Vitals requested from patient and listed below if patient had equipment and was able to obtain at home for this virtual visit: Vitals:   07/08/23 1225  BP: (!) 140/73  Pulse: 78   Estimated body mass index is 25.69 kg/m as calculated from the following:   Height as of 03/13/23: 5\' 7"  (1.702 m).   Weight as of 05/13/23: 164 lb (74.4 kg).  EKG (optional): deferred due to virtual visit  GENERAL: alert, oriented, no acute distress detected, full vision exam deferred due to pandemic and/or virtual encounter  PSYCH/NEURO: pleasant and cooperative, no obvious depression or anxiety, speech and thought processing grossly intact, Cognitive function grossly intact  Flowsheet Row Office Visit from [facility]/13/2023 [facility] [place]  PHQ-9 Total Score 0           05/13/2023    4:26 PM 02/18/2023   12:58 PM 01/14/2023    3:01 PM 08/13/2022    3:24 PM 07/02/2022   11:02 AM  Depression screen PHQ 2/9  Decreased Interest 0 0 0 0 0  Down, Depressed, Hopeless 0 0 0 0 0  PHQ - 2 Score 0 0 0 0 0  Altered sleeping 0   0 0  Tired, decreased energy 0   3 0  Change in appetite 0   0 0  Feeling bad or failure about yourself  0   0 0  Trouble concentrating 0   0 0  Moving slowly or fidgety/restless 0   0 0  Suicidal thoughts 0   0 0  PHQ-9 Score 0   3 0  Difficult doing work/chores Not difficult at all           08/07/2022    1:13 PM 08/07/2022    8:17 PM 08/08/2022    8:00 AM 08/13/2022    3:23 PM 05/13/2023    4:26 PM  Fall Risk  Falls in the past year?    0 0   Was there an injury with Fall?    0 0  Fall Risk Category Calculator    0 0  Fall Risk Category (Retired)    Low   (RETIRED) Patient Fall Risk Level Moderate fall risk Moderate fall risk Moderate fall risk Low fall risk   Patient at Risk for Falls Due to    No Fall Risks No Fall Risks  Fall risk Follow up    Falls evaluation completed Falls evaluation completed     SUMMARY AND PLAN:  Encounter for Medicare annual wellness exam   Discussed applicable health maintenance/preventive health measures and advised and referred or ordered per patient preferences: -discussed vaccines due, recommendations and where to get each. She plans to get the updated  covid vaccine soon and the updated flu vaccine in October.  Health Maintenance  Topic Date Due   Zoster Vaccines- Shingrix (1 of 2) Never done   INFLUENZA VACCINE  06/05/2023   COVID-19 Vaccine (4 - 2023-24 season) 07/06/2023   Medicare Annual Wellness (AWV)  07/07/2024   DTaP/Tdap/Td (2 - Td or Tdap) 11/07/2027   Pneumonia Vaccine 16+ Years old  Completed   DEXA SCAN  Completed   HPV VACCINES  Aged [employer] and counseling on the following was provided based on the above review of health and a plan/checklist for the patient, along with additional information discussed, was provided for the patient in the patient instructions :   -Provided counseling and plan for increased risk of falling if applicable per above screening. Discussed safe balance exercises that can be done at home to improve balance and discussed exercise guidelines for adults with include balance exercises at least 3 days per week.  -Advised and counseled on a healthy lifestyle - particularly factors to improve BP -Reviewed patient's current diet. Advised and counseled on a whole foods based healthy diet. Advised reducing socium to < 1500mg  per day and including lots of veggies and fruits rich in magnesium. A summary of a healthy diet was provided in the Patient  Instructions.  -reviewed patient's current physical activity level and discussed exercise guidelines for adults. Discussed safe exercise at home to assist in meeting exercise guideline recommendations in a safe and healthy way.  -Advise yearly dental visits at minimum and regular eye exams -advised to bring medications, BP cuff and log to appt with PCP tomorrow   Follow up: see patient instructions     Patient Instructions  I really enjoyed getting to talk with you today! I am available on Tuesdays and Thursdays for virtual visits if you have any questions or concerns, or if I can be of any further assistance.   CHECKLIST FROM ANNUAL WELLNESS VISIT:  -Follow up (please call to schedule if not scheduled after visit):   -in person visit with Dr. Clent Ridges tomorrow as planned for the blood pressure, bring cuff and log with you, along with all of your medications   -yearly for annual wellness visit with primary care office  Here is a list of your preventive care/health maintenance measures and the plan for each if any are due:  PLAN For any measures below that may be due:   Health Maintenance  Topic Date Due   Zoster Vaccines- Shingrix (1 of 2) Never done   INFLUENZA VACCINE  06/05/2023   COVID-19 Vaccine (4 - 2023-24 season) 07/06/2023   Medicare Annual Wellness (AWV)  07/07/2024   DTaP/Tdap/Td (2 - Td or Tdap) 11/07/2027   Pneumonia Vaccine 41+ Years old  Completed   DEXA SCAN  Completed   HPV VACCINES  Aged Out    -See a dentist at least yearly  -Get your eyes checked and then per your eye specialist's recommendations  -Other issues addressed today:   -I have included below further information regarding a healthy whole foods based diet, physical activity guidelines for adults, stress management and opportunities for social connections. I hope you find this information useful.    -----------------------------------------------------------------------------------------------------------------------------------------------------------------------------------------------------------------------------------------------------------  NUTRITION: -eat real food: lots of colorful vegetables (half the plate) and fruits -5-7 servings of vegetables and fruits per day (fresh or steamed is best), exp. 2 servings of vegetables with lunch and dinner and 2 servings of fruit per day. Berries and greens such as  kale and collards are great choices.  -consume on a regular basis: whole grains (make sure first ingredient on label contains the word "whole"), fresh fruits, fish, nuts, seeds, healthy oils (such as olive oil, avocado oil, grape seed oil) -may eat small amounts of dairy and lean meat on occasion, but avoid processed meats such as ham, bacon, lunch meat, etc. -drink water -try to avoid fast food and pre-packaged foods, processed meat -most experts advise limiting sodium to < 2300mg  per day, should limit further is any chronic conditions such as high blood pressure, heart disease, diabetes, etc. The American Heart Association advised that < 1500mg  is is ideal -try to avoid foods that contain any ingredients with names you do not recognize  -try to avoid sugar/sweets (except for the natural sugar that occurs in fresh fruit) -try to avoid sweet drinks -try to avoid white rice, white bread, pasta (unless whole grain), white or yellow potatoes  EXERCISE GUIDELINES FOR ADULTS: -if you wish to increase your physical activity, do so gradually and with the approval of your doctor -STOP and seek medical care immediately if you have any chest pain, chest discomfort or trouble breathing when starting or increasing exercise  -move and stretch your body, legs, feet and arms when sitting for long periods -Physical activity guidelines for optimal health in adults: -least 150 minutes per week of  aerobic exercise (can talk, but not sing) once approved by your doctor, 20-30 minutes of sustained activity or two 10 minute episodes of sustained activity every day.  -resistance training at least 2 days per week if approved by your doctor -balance exercises 3+ days per week:   Stand somewhere where you have something sturdy to hold onto if you lose balance.    1) lift up on toes, start with 5x per day and work up to 20x   2) stand and lift on leg straight out to the side so that foot is a few inches of the floor, start with 5x each side and work up to 20x each side   3) stand on one foot, start with 5 seconds each side and work up to 20 seconds on each side  If you need ideas or help with getting more active:  -Silver sneakers https://tools.silversneakers.com  -Walk with a Doc: http://www.duncan-williams.com/  -try to include resistance (weight lifting/strength building) and balance exercises twice per week: or the following link for ideas: http://castillo-powell.com/  BuyDucts.dk  STRESS MANAGEMENT: -can try meditating, or just sitting quietly with deep breathing while intentionally relaxing all parts of your body for 5 minutes daily -if you need further help with stress, anxiety or depression please follow up with your primary doctor or contact the wonderful folks at [facility]: [phone]  SOCIAL CONNECTIONS: -options in [place] if you wish to engage in more social and exercise related activities:  -Silver sneakers https://tools.silversneakers.com  -Walk with a Doc: http://www.duncan-williams.com/  -Check out the [facility] Active Adults 50+ section on the [place] of [employer] (hiking clubs, book clubs, cards and games, chess, exercise classes, aquatic classes and much more) - see the website for  details: https://www.[place]-.gov/departments/parks-recreation/active-adults50  -YouTube has lots of exercise videos for different ages and abilities as well  -[facility] (a variety of indoor and outdoor inperson activities for adults). 470-741-3496. [address]  -Virtual Online Classes (a variety of topics): see seniorplanet.org or call [phone]  -consider volunteering at a school, hospice center, church, senior center or elsewhere  Terressa Koyanagi, DO

## 2023-07-09 ENCOUNTER — Ambulatory Visit (INDEPENDENT_AMBULATORY_CARE_PROVIDER_SITE_OTHER): Payer: PPO | Admitting: Family Medicine

## 2023-07-09 ENCOUNTER — Encounter: Payer: Self-pay | Admitting: Family Medicine

## 2023-07-09 VITALS — BP 152/70 | HR 69 | Temp 98.1°F | Wt 163.0 lb

## 2023-07-09 DIAGNOSIS — I10 Essential (primary) hypertension: Secondary | ICD-10-CM | POA: Diagnosis not present

## 2023-07-09 MED ORDER — METOPROLOL SUCCINATE ER 100 MG PO TB24: 100.0000 mg | ORAL_TABLET | Freq: Every evening | ORAL | 3 refills | Status: DC

## 2023-07-09 MED ORDER — HYDRALAZINE HCL 50 MG PO TABS: ORAL_TABLET | ORAL | Status: DC

## 2023-07-09 NOTE — Progress Notes (Signed)
   Subjective:    Patient ID: Barbara [name], female    DOB: 1940-11-27, 82 y.o.   MRN: [id number]  HPI Here to follow up on BP. For the past few weeks she has been taking Metoprolol succinate 50 mg BID in addition to Hydralazine 75 mg in the am and 100 mg in the pm. She feels fine. Her BP in the mornings remains high in the 160's systolic most of the time. Then after she takes her morning meds the BP goes down to the 120's or 130's for the rest of the day.    Review of Systems  Constitutional: Negative.   Respiratory: Negative.    Cardiovascular: Negative.        Objective:   Physical Exam Constitutional:      Appearance: Normal appearance.  Cardiovascular:     Rate and Rhythm: Normal rate and regular rhythm.     Pulses: Normal pulses.     Heart sounds: Normal heart sounds.  Pulmonary:     Effort: Pulmonary effort is normal.     Breath sounds: Normal breath sounds.  Musculoskeletal:     Right lower leg: No edema.     Left lower leg: No edema.  Neurological:     Mental Status: She is alert.           Assessment & Plan:  HTN. We will change the Metoprolol succinate to taking all 100 mg in the evenings with none in the mornings. Recheck in 2 weeks.  Gershon Crane, MD

## 2023-07-11 ENCOUNTER — Ambulatory Visit: Payer: PPO | Attending: Cardiology

## 2023-07-11 DIAGNOSIS — R7989 Other specified abnormal findings of blood chemistry: Secondary | ICD-10-CM

## 2023-07-12 LAB — HEPATIC FUNCTION PANEL
ALT: 42 IU/L — ABNORMAL HIGH (ref 0–32)
AST: 41 IU/L — ABNORMAL HIGH (ref 0–40)
Albumin: 3.9 g/dL (ref 3.7–4.7)
Alkaline Phosphatase: 92 IU/L (ref 44–121)
Bilirubin Total: 0.4 mg/dL (ref 0.0–1.2)
Bilirubin, Direct: 0.15 mg/dL (ref 0.00–0.40)
Total Protein: 6.4 g/dL (ref 6.0–8.5)

## 2023-07-18 ENCOUNTER — Telehealth: Payer: Self-pay | Admitting: *Deleted

## 2023-07-18 NOTE — Telephone Encounter (Signed)
Left message to call back  

## 2023-07-18 NOTE — Telephone Encounter (Signed)
-----   Message from [facility] sent at 07/15/2023  6:37 PM EDT ----- LFTs remain elevated. Hold amiodarone and recheck in 3 months

## 2023-07-25 ENCOUNTER — Telehealth: Payer: Self-pay | Admitting: Family Medicine

## 2023-07-25 NOTE — Telephone Encounter (Addendum)
Pt called to say she received a No Show letter for the 07/09/23 OV. Pt states she was present for this OV and did not miss it.   Also, Pt states she has been taking her BP meds every night, but when she awakens, she has some BP concerns and would like a call back to discuss:  07/24/23 = 174 (missing diastolic) 07/22/23 = 167/84 07/21/23 = 170/90  Please call Pt back to discuss.

## 2023-07-25 NOTE — Telephone Encounter (Signed)
FYI Spoke with pt scheduled appointment for 07/29/23 11 am. Pt advised if BP gets elevated and experiences symptoms to seek ED help over the weekend. Pt verbalized understanding

## 2023-07-28 NOTE — Telephone Encounter (Signed)
She should not have received a No Show letter, please look into this. Also tell her to increase the evening dose of Hydralazine to 3 tablets

## 2023-07-29 ENCOUNTER — Telehealth: Payer: Self-pay

## 2023-07-29 ENCOUNTER — Ambulatory Visit (INDEPENDENT_AMBULATORY_CARE_PROVIDER_SITE_OTHER): Payer: PPO | Admitting: Family Medicine

## 2023-07-29 ENCOUNTER — Encounter: Payer: Self-pay | Admitting: Family Medicine

## 2023-07-29 ENCOUNTER — Telehealth: Payer: Self-pay | Admitting: Cardiology

## 2023-07-29 VITALS — BP 124/60 | HR 74 | Temp 98.5°F | Wt 157.0 lb

## 2023-07-29 DIAGNOSIS — I480 Paroxysmal atrial fibrillation: Secondary | ICD-10-CM | POA: Diagnosis not present

## 2023-07-29 DIAGNOSIS — G4733 Obstructive sleep apnea (adult) (pediatric): Secondary | ICD-10-CM

## 2023-07-29 DIAGNOSIS — I10 Essential (primary) hypertension: Secondary | ICD-10-CM

## 2023-07-29 NOTE — Telephone Encounter (Signed)
Spoke to patient she stated she saw PCP and he wanted her to schedule appointment with [facility] regarding her elevated B/P.Appointment scheduled with Edd Fabian NP 9/30 at 2:45 pm.Advised to bring a list of B/P readings and all medications.

## 2023-07-29 NOTE — Progress Notes (Signed)
Subjective:    Patient ID: Barbara [name], female    DOB: [birth year], 81 y.o.   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 We saw her 2 weeks ago for early morning high BP readings, and we changed the timing of her medications. She now takes the full 100 mg of Metoprolol in the evenings before bedtime. However she still gets very high readings in the mornings right after she gets up, sometimes up to 190/98. These readings then come down later in the mornings. She asks if her CPAP could be causing this.    Review of Systems  Constitutional: Negative.   Respiratory: Negative.    Cardiovascular: Negative.        Objective:   Physical Exam Constitutional:      Appearance: Normal appearance.  Cardiovascular:     Rate and Rhythm: Normal rate and regular rhythm.     Pulses: Normal pulses.     Heart sounds: Normal heart sounds.  Pulmonary:     Effort: Pulmonary effort is normal.  Musculoskeletal:     Right lower leg: No edema.     Left lower leg: No edema.  Neurological:     Mental Status: She is alert.           Assessment & Plan:  Labile HTN. I advised her to see her cardiologist, Dr. Tresa Endo, sometime soon to address this. I told her it is possible that the CPAP may be affecting her BP, and possibly the settings need to be adjusted. She will discuss these issues with him.  Gershon Crane, MD

## 2023-07-29 NOTE — Telephone Encounter (Signed)
Message sent to [facility] for advise

## 2023-07-29 NOTE — Telephone Encounter (Signed)
Patient calling to speak to the nurse about her BP, her PCP has some concerns. Please advise

## 2023-07-30 NOTE — Telephone Encounter (Signed)
Pt has been notified.

## 2023-07-30 NOTE — Telephone Encounter (Signed)
Message sent to office manager for advise. Pt also had a visit with Dr Clent Ridges 07/29/23

## 2023-08-01 NOTE — Progress Notes (Unsigned)
Cardiology Clinic Note   Patient Name: Barbara [name] Date of Encounter: 08/04/2023  Primary Care Provider:  [name], MD Primary Cardiologist:  Nicki Guadalajara, MD  Patient Profile     Barbara [name] 82 year old female presents to the clinic today for follow-up evaluation of her coronary artery disease, hypertension, and paroxysmal atrial fibrillation.  Past Medical History    Past Medical History:  Diagnosis Date   A-fib [facility])    Allergy    CAD (coronary artery disease)    sees Dr. Nicki Guadalajara  cardiac stents - 2000   CHF (congestive heart failure) (HCC)    Colon polyps    Complication of anesthesia    rash/hives with "caines"   Dyspnea    02/12/18 " when my heart gets out of rhythm, it has not been out of rhythym- since I have been on Tikosyn (11/2017)   Dysrhythmia    afib fib   GERD (gastroesophageal reflux disease)    takes OTC- Omeprazole- prn   Heart murmur    History of kidney stones    History of stress test    show normal perfusion without scar or ischemia, post EF 68%   Hx of echocardiogram    show an EF 55%-60% range with grade 1 diastolic dysfunction, she had mitral anular calcification with mild MR, moderate LA dilation and mild pulmonary hypertension with a PA estimated pressure of 39mm   Hyperlipidemia    Hypertension    NSTEMI (non-ST elevated myocardial infarction) (HCC)    Osteoarthritis    Pacemaker    Pneumonia    hx of 2015    PONV (postoperative nausea and vomiting)    Sleep apnea    Past Surgical History:  Procedure Laterality Date   CARDIAC CATHETERIZATION     11/2017   cardiac stents  2000   COLONOSCOPY  01-05-14   per Dr. Dorena Cookey, clear, no repeats needed    COLONOSCOPY     CORONARY STENT PLACEMENT  2000   in LAD   CYSTOSCOPY WITH RETROGRADE PYELOGRAM, URETEROSCOPY AND STENT PLACEMENT Left 06/11/2022   Procedure: CYSTOSCOPY WITH RETROGRADE PYELOGRAM, LEFT STENT PLACEMENT;  Surgeon: Crista Elliot, MD;   Location: [facility];  Service: Urology;  Laterality: Left;   CYSTOSCOPY/URETEROSCOPY/HOLMIUM LASER/STENT PLACEMENT Left 06/25/2022   Procedure: CYSTOSCOPY LEFT URETEROSCOPY/HOLMIUM LASER/STENT PLACEMENT;  Surgeon: Bjorn Pippin, MD;  Location: [facility];  Service: Urology;  Laterality: Left;  1 HR FOR THIS CASE   DIRECT LARYNGOSCOPY WITH RADIAESSE INJECTION N/A 02/13/2018   Procedure: DIRECT LARYNGOSCOPY WITH RADIAESSE INJECTION;  Surgeon: Christia Reading, MD;  Location: [facility] OR;  Service: ENT;  Laterality: N/A;   EYE SURGERY Left    CATARACT REMOVAL   KNEE ARTHROSCOPY Left 01/06/2015   Procedure: LEFT KNEE ARTHROSCOPY, abrasion chondroplasty of the medial femerol condryl,medial and lateral menisectomy, microfracture , synovectomy of the suprpatellar pouch;  Surgeon: Ranee Gosselin, MD;  Location: [facility];  Service: Orthopedics;  Laterality: Left;   LEFT HEART CATH AND CORONARY ANGIOGRAPHY N/A 02/26/2019   Procedure: LEFT HEART CATH AND CORONARY ANGIOGRAPHY;  Surgeon: Lennette Bihari, MD;  Location: [facility];  Service: Cardiovascular;  Laterality: N/A;   LEFT HEART CATH AND CORONARY ANGIOGRAPHY N/A 05/06/2022   Procedure: LEFT HEART CATH AND CORONARY ANGIOGRAPHY;  Surgeon: Yvonne Kendall, MD;  Location: [facility];  Service: Cardiovascular;  Laterality: N/A;   MICROLARYNGOSCOPY W/VOCAL CORD INJECTION N/A 08/07/2018   Procedure: MICROLARYNGOSCOPY WITH VOCAL CORD  INJECTION OF PROLARYN;  Surgeon: Christia Reading, MD;  Location: [facility] OR;  Service: ENT;  Laterality: N/A;  JET VENTILATION   PACEMAKER IMPLANT N/A 05/08/2022   Procedure: PACEMAKER IMPLANT;  Surgeon: Regan Lemming, MD;  Location: [facility];  Service: Cardiovascular;  Laterality: N/A;   REVERSE SHOULDER ARTHROPLASTY Right 03/23/2020   Procedure: REVERSE SHOULDER ARTHROPLASTY;  Surgeon: Yolonda Kida, MD;  Location: [facility] OR;  Service: Orthopedics;  Laterality: Right;   VAGINAL HYSTERECTOMY  1971    Allergies  Allergies   Allergen Reactions   Lidocaine Anaphylaxis   Morphine Other (See Comments)    "Body shuts down"   Procaine Hcl Anaphylaxis, Rash and Other (See Comments)    "Anything with 'caine' in it "   Sulfonamide Derivatives Hives   Amlodipine Swelling   Norco [Hydrocodone-Acetaminophen] Nausea And Vomiting and Other (See Comments)    States does ok with IV form    Tramadol Nausea And Vomiting   Vytorin [Ezetimibe-Simvastatin] Other (See Comments)    Joint pain   Tape Itching and Other (See Comments)    Patient prefers either paper tape or Coban wrap    History of Present Illness     [name] has a PMH of HTN, HLD, paroxysmal atrial fibrillation, PPM, lower extremity edema, elevated LP(a), CKD stage IIIb, and CAD.  She underwent cardiac catheterization with PCI to her LAD with BMS 09/1999.  She underwent nuclear stress testing 11/13 which showed normal perfusion.  Echocardiogram 05/04/2022 showed an LVEF of greater than 75%, intermediate diastolic parameters, mildly elevated PA systolic pressure, moderately dilated left atria, moderate mitral valve regurgitation.  Cardiac MRI confirmed apical hypertrophy.  She was noted to have patchy late gadolinium enhancement at the apex and RV insertion site.  Her LGE accounted for 2% of total myocardial mass.  Genetic testing was recommended and plan for 1/24.  She was seen in follow-up by Dr. Tresa Endo on 03/11/2023.  She had been seen and evaluated by Dr. Elberta Fortis who interrogated her pacemaker which showed normal device function.  She reported fatigue and feeling tired.  She continued to intermittently use her cane with ambulation.  She reported that on checking her blood pressure with waking up it was 155-160 systolic.  She noted improvement with medication.  Her lower extremity swelling improved after being off of amlodipine.  She continued to use her CPAP.  Compliance was verified.  She denied chest pain.  It was recommended that she increase her  furosemide to daily for help with her diastolic CHF and hypertension.  It was recommended that her hydralazine be uptitrated to 100 mg twice daily if blood pressure remained elevated.  She contacted the nurse triage line on 07/29/2023.  She indicated that she had been seen by her PCP who recommended she follow-up with cardiology for further evaluation and management of her elevated blood pressure.  She presents to the clinic today for follow-up evaluation and states her blood pressure continues to be elevated.  She brings a blood pressure log which shows blood pressures in the 150s-160s over 80s.  We reviewed the importance of low-sodium diet and secondary causes of hypertension.  She expressed understanding.  She continues to be compliant with her CPAP.  Her EKG today shows a paced rhythm at 73 bpm.  Her heart rate is 73.  I will increase her hydralazine to 100 mg twice daily and plan follow-up in 2 months..  Today she denies chest pain, shortness of breath, lower extremity  edema, fatigue, palpitations, melena, hematuria, hemoptysis, diaphoresis, weakness, presyncope, syncope, orthopnea, and PND.   Home Medications    Prior to Admission medications   Medication Sig Start Date End Date Taking? Authorizing Provider  acetaminophen (TYLENOL) 500 MG tablet Take 500 mg by mouth daily as needed for headache.    [provider]  amiodarone (PACERONE) 200 MG tablet Take 1 tablet (200 mg total) by mouth daily. 12/20/22   Lennette Bihari, MD  apixaban Everlene Balls) 2.5 MG TABS tablet Take 1 tablet by mouth twice daily 04/01/23   Lennette Bihari, MD  aspirin 81 MG chewable tablet Chew 1 tablet (81 mg total) by mouth daily. 05/11/22   Leroy Sea, MD  Blood Pressure Monitoring (BLOOD PRESSURE MONITOR AUTOMAT) DEVI 1 Units/min by Does not apply route daily. 04/17/23   Lennette Bihari, MD  Cholecalciferol (VITAMIN D3) 2000 units TABS Take 2,000 Units by mouth daily.    [provider]   dextromethorphan (DELSYM) 30 MG/5ML liquid Take 15 mg by mouth daily as needed for cough.    [provider]  docusate sodium (COLACE) 100 MG capsule Take 100 mg by mouth as needed for mild constipation.    [provider]  empagliflozin (JARDIANCE) 10 MG TABS tablet Take 1 tablet (10 mg total) by mouth daily. 09/03/22   [name], MD  furosemide (LASIX) 20 MG tablet Take 1 tablet by mouth once daily 05/16/23   [name], MD  hydrALAZINE (APRESOLINE) 50 MG tablet Take 1.5 tabs in the AM and take 2 tabs in the PM 07/09/23   [name], MD  ipratropium (ATROVENT) 0.06 % nasal spray Place 2 sprays into both nostrils 2 (two) times daily as needed (allergies). 04/28/18   [provider]  isosorbide mononitrate (IMDUR) 30 MG 24 hr tablet Take 1 tablet (30 mg total) by mouth daily. 12/20/22   Lennette Bihari, MD  ketoconazole (NIZORAL) 2 % cream APPLY 1 APPLICATION TOPICALLY TWO TIMES DAILY AS NEEDED FOR IRRITATION 10/03/22   [name], MD  loratadine (CLARITIN) 10 MG tablet Take 10 mg by mouth daily as needed for allergies.    [provider]  LORazepam (ATIVAN) 0.5 MG tablet TAKE 1 TABLET BY MOUTH EVERY 6 HOURS AS NEEDED FOR ANXIETY 05/16/23   [name], MD  metoprolol succinate (TOPROL-XL) 100 MG 24 hr tablet Take 1 tablet (100 mg total) by mouth every evening. Take with or immediately following a meal. 07/09/23   [name], MD  Multiple Vitamin (MULTIVITAMIN WITH MINERALS) TABS tablet Take 1 tablet by mouth daily.    [provider]  nitroGLYCERIN (NITROSTAT) 0.4 MG SL tablet DISSOLVE ONE TABLET UNDER THE TONGUE EVERY 5 MINUTES AS NEEDED FOR CHEST PAIN.  DO NOT EXCEED A TOTAL OF 3 DOSES IN 15 MINUTES 03/27/23   Lennette Bihari, MD  pantoprazole (PROTONIX) 40 MG tablet Take 1 tablet (40 mg total) by mouth daily. 12/20/22 12/21/23  Lennette Bihari, MD  polyethylene glycol powder (GLYCOLAX/MIRALAX) 17 GM/SCOOP powder Take 17 g by mouth daily.  05/10/22   Leroy Sea, MD  potassium chloride (KLOR-CON) 10 MEQ tablet Take 1 tablet (10 mEq total) by mouth daily. 08/14/22   [name], MD  Probiotic Product (PROBIOTIC GUMMIES PO) Take 2 tablets by mouth every morning.    [provider]  rosuvastatin (CRESTOR) 40 MG tablet Take 1 tablet (40 mg total) by mouth daily. 11/11/22   Tresa Endo,  Clovis Pu, MD  triamcinolone (NASACORT) 55 MCG/ACT nasal inhaler Place 1 spray into both nostrils daily as needed (for allergies).  10/17/12   [name], MD    Family History    Family History  Problem Relation Age of Onset   Cancer Maternal Grandmother    Heart disease Maternal Grandfather    She indicated that her mother is deceased. She indicated that her father is deceased. She indicated that her maternal grandmother is deceased. She indicated that her maternal grandfather is deceased.  Social History    Social History   Socioeconomic History   Marital status: Widowed    Spouse name: Not on file   Number of children: 2   Years of education: college   Highest education level: High school graduate  Occupational History   Occupation: Retired  Tobacco Use   Smoking status: Former    Current packs/day: 0.00    Types: Cigarettes    Start date: 11/04/1956    Quit date: 11/04/1996    Years since quitting: 26.7   Smokeless tobacco: Never  Vaping Use   Vaping status: Never Used  Substance and Sexual Activity   Alcohol use: No    Alcohol/week: 0.0 standard drinks of alcohol   Drug use: No   Sexual activity: Not Currently  Other Topics Concern   Not on file  Social History Narrative   Not on file   Social Determinants of Health   Financial Resource Strain: Low Risk  (08/05/2022)   Overall Financial Resource Strain (CARDIA)    Difficulty of Paying Living Expenses: Not hard at all  Food Insecurity: No Food Insecurity (08/05/2022)   Hunger Vital Sign    Worried About Running Out of Food in the Last Year: Never true    Ran  Out of Food in the Last Year: Never true  Transportation Needs: No Transportation Needs (08/05/2022)   PRAPARE - Administrator, Civil Service (Medical): No    Lack of Transportation (Non-Medical): No  Physical Activity: Insufficiently Active (07/02/2022)   Exercise Vital Sign    Days of Exercise per Week: 1 day    Minutes of Exercise per Session: 20 min  Stress: No Stress Concern Present (07/02/2022)   [employer] of Occupational Health - Occupational Stress Questionnaire    Feeling of Stress : Not at all  Social Connections: Moderately Integrated (07/02/2022)   Social Connection and Isolation Panel [NHANES]    Frequency of Communication with Friends and Family: More than three times a week    Frequency of Social Gatherings with Friends and Family: More than three times a week    Attends Religious Services: More than 4 times per year    Active Member of [employer] or Organizations: Yes    Attends Banker Meetings: More than 4 times per year    Marital Status: Widowed  Intimate Partner Violence: Not At Risk (07/02/2022)   Humiliation, Afraid, Rape, and Kick questionnaire    Fear of Current or Ex-Partner: No    Emotionally Abused: No    Physically Abused: No    Sexually Abused: No     Review of Systems    General:  No chills, fever, night sweats or weight changes.  Cardiovascular:  No chest pain, dyspnea on exertion, edema, orthopnea, palpitations, paroxysmal nocturnal dyspnea. Dermatological: No rash, lesions/masses Respiratory: No cough, dyspnea Urologic: No hematuria, dysuria Abdominal:   No nausea, vomiting, diarrhea, bright red blood per rectum, melena, or hematemesis Neurologic:  No visual changes, wkns, changes in mental status. All other systems reviewed and are otherwise negative except as noted above.  Physical Exam    VS:  BP (!) 155/80   Pulse 73   Ht 5\' 7"  (1.702 m)   Wt 163 lb 3.2 oz (74 kg)   SpO2 99%   BMI 25.56 kg/m  , BMI Body mass  index is 25.56 kg/m. GEN: Well nourished, well developed, in no acute distress. HEENT: normal. Neck: Supple, no JVD, carotid bruits, or masses. Cardiac: RRR, no murmurs, rubs, or gallops. No clubbing, cyanosis, edema.  Radials/DP/PT 2+ and equal bilaterally.  Respiratory:  Respirations regular and unlabored, clear to auscultation bilaterally. GI: Soft, nontender, nondistended, BS + x 4. MS: no deformity or atrophy. Skin: warm and dry, no rash. Neuro:  Strength and sensation are intact. Psych: Normal affect.  Accessory Clinical Findings    Recent Labs: 08/06/2022: Magnesium 1.9 08/07/2022: Hemoglobin 11.9; Platelets 197 08/13/2022: BUN 25; Creatinine, Ser 1.96; Potassium 3.4; Sodium 139 05/02/2023: TSH 2.170 07/11/2023: ALT 42   Recent Lipid Panel    Component Value Date/Time   CHOL 148 06/13/2021 1459   TRIG 244.0 (H) 06/13/2021 1459   HDL 57.60 06/13/2021 1459   CHOLHDL 3 06/13/2021 1459   VLDL 48.8 (H) 06/13/2021 1459   LDLCALC 50 10/08/2020 0313   LDLDIRECT 54.0 06/13/2021 1459    HYPERTENSION CONTROL Vitals:   08/04/23 1439 08/04/23 1652  BP: (!) 158/84 (!) 155/80    The patient's blood pressure is elevated above target today.  In order to address the patient's elevated BP: Blood pressure will be monitored at home to determine if medication changes need to be made.; A current anti-hypertensive medication was adjusted today.       ECG personally reviewed by me today- EKG Interpretation Date/Time:  Monday August 04 2023 14:46:38 EDT Ventricular Rate:  73 PR Interval:  374 QRS Duration:  102 QT Interval:  424 QTC Calculation: 467 R Axis:   19  Text Interpretation: Atrial-paced rhythm with prolonged AV conduction Confirmed by Edd Fabian [phone]) on 08/04/2023 3:07:40 PM   Cardiac catheterization 02/26/2019  Prox RCA to Mid RCA lesion is 15% stenosed. Ost LAD to Prox LAD lesion is 20% stenosed. Prox Cx lesion is 20% stenosed.   Mild non-obstructive  coronary artery disease with a patent proximal LAD stent with mild 20% intimal hyperplasia (inserted  09/1999); 20% proximal left circumflex narrowing, and mild irregularity of 10 to 15% in the mid RCA.   Hyperdynamic LV function with an "Ace of Spade "configuration and near cavity obliteration in the mid to apical segment during systole.  There is evidence for left ventricular hypertrophy.  LVEDP is 16 mm.   RECOMMENDATION: Medical therapy with optimal blood pressure control, lipid management, and resumption of Eliquis tomorrow     CATH: 05/06/2022 Conclusions: Mild-moderate, non-obstructive coronary artery disease. Patent proximal/mid LAD stent with up to 30% in-stent restenosis. Low left ventricular filling pressure (LVEDP ~5 mmHg).   Recommendations: Continue secondary prevention of coronary artery disease. Proceed with pacemaker placement per Dr. Graciela Husbands.     Echocardiogram 05/04/2022  IMPRESSIONS     1. Left ventricular ejection fraction, by estimation, is >75%. The left  ventricle has hyperdynamic function. The left ventricle has no regional  wall motion abnormalities. Left ventricular diastolic parameters are  indeterminate. Elevated left atrial  pressure.   2. Right ventricular systolic function is normal. The right ventricular  size is normal. There is mildly elevated pulmonary artery  systolic  pressure.   3. Left atrial size was moderately dilated.   4. The mitral valve is normal in structure. Moderate mitral valve  regurgitation. No evidence of mitral stenosis.   5. The aortic valve is tricuspid. Aortic valve regurgitation is not  visualized. Aortic valve sclerosis is present, with no evidence of aortic  valve stenosis.   6. The inferior vena cava is normal in size with greater than 50%  respiratory variability, suggesting right atrial pressure of 3 mmHg.   FINDINGS   Left Ventricle: Left ventricular ejection fraction, by estimation, is  >75%. The left ventricle has  hyperdynamic function. The left ventricle has  no regional wall motion abnormalities. Definity contrast agent was given  IV to delineate the left  ventricular endocardial borders. The left ventricular internal cavity size  was normal in size. There is no left ventricular hypertrophy. Left  ventricular diastolic parameters are indeterminate. Elevated left atrial  pressure.   Right Ventricle: The right ventricular size is normal. Right ventricular  systolic function is normal. There is mildly elevated pulmonary artery  systolic pressure. The tricuspid regurgitant velocity is 3.14 m/s, and  with an assumed right atrial pressure  of 3 mmHg, the estimated right ventricular systolic pressure is 42.4 mmHg.   Left Atrium: Left atrial size was moderately dilated.   Right Atrium: Right atrial size was normal in size.   Pericardium: There is no evidence of pericardial effusion.   Mitral Valve: The mitral valve is normal in structure. Moderate mitral  valve regurgitation. No evidence of mitral valve stenosis. MV peak  gradient, 8.6 mmHg. The mean mitral valve gradient is 2.0 mmHg.   Tricuspid Valve: The tricuspid valve is normal in structure. Tricuspid  valve regurgitation is mild . No evidence of tricuspid stenosis.   Aortic Valve: The aortic valve is tricuspid. Aortic valve regurgitation is  not visualized. Aortic valve sclerosis is present, with no evidence of  aortic valve stenosis.   Pulmonic Valve: The pulmonic valve was normal in structure. Pulmonic valve  regurgitation is trivial. No evidence of pulmonic stenosis.   Aorta: The aortic root is normal in size and structure.   Venous: The inferior vena cava is normal in size with greater than 50%  respiratory variability, suggesting right atrial pressure of 3 mmHg.   IAS/Shunts: No atrial level shunt detected by color flow Doppler.        Assessment & Plan   1.  Essential hypertension-BP today 155/80. Maintain blood pressure  log Increase hydralazine to 100 mg twice daily Continue metoprolol, Imdur  Paroxysmal atrial fibrillation-heart rate today 73. Reports compliance with apixaban and denies bleeding issues. Denies recent falls. Continue apixaban, amiodarone, metoprolol Avoid triggers caffeine, chocolate, EtOH, dehydration etc.  Coronary artery disease-denies recent episodes of arm neck back or chest discomfort.  Underwent cardiac catheterization 02/26/2020 which showed patent LAD stent and nonobstructive mild coronary disease in her circumflex and RCA. Continue current medical therapy Heart healthy low-sodium high-fiber diet  OSA-reports compliance with CPAP.  Waking up well rested. Continue CPAP use  Exertional dyspnea-stable.  Continues to use cane with ambulation. Continue CPAP use Increase physical activity as tolerated Heart healthy low-sodium diet  Disposition: Follow-up with Dr. Tresa Endo or me in 2-3 months.   Thomasene Ripple. Jermie Hippe NP-C     08/04/2023, 4:53 PM [facility] [address] Office [phone] Fax [phone]    I spent 14 minutes examining this patient, reviewing medications, and using patient centered shared  decision making involving her cardiac care.  Prior to her visit I spent greater than 20 minutes reviewing her past medical history,  medications, and prior cardiac tests.

## 2023-08-04 ENCOUNTER — Ambulatory Visit: Payer: PPO | Attending: General Practice | Admitting: General Practice

## 2023-08-04 ENCOUNTER — Encounter: Payer: Self-pay | Admitting: General Practice

## 2023-08-04 VITALS — BP 155/80 | HR 73 | Ht 67.0 in | Wt 163.2 lb

## 2023-08-04 DIAGNOSIS — I480 Paroxysmal atrial fibrillation: Secondary | ICD-10-CM

## 2023-08-04 DIAGNOSIS — I2510 Atherosclerotic heart disease of native coronary artery without angina pectoris: Secondary | ICD-10-CM | POA: Diagnosis not present

## 2023-08-04 DIAGNOSIS — R0609 Other forms of dyspnea: Secondary | ICD-10-CM | POA: Diagnosis not present

## 2023-08-04 DIAGNOSIS — I10 Essential (primary) hypertension: Secondary | ICD-10-CM | POA: Diagnosis not present

## 2023-08-04 DIAGNOSIS — G4733 Obstructive sleep apnea (adult) (pediatric): Secondary | ICD-10-CM

## 2023-08-04 MED ORDER — HYDRALAZINE HCL 100 MG PO TABS: ORAL_TABLET | ORAL | 3 refills | Status: DC

## 2023-08-04 NOTE — Patient Instructions (Signed)
Medication Instructions:  INCREASE HYDRALAZINE 100MG  TWICE DAILY *If you need a refill on your cardiac medications before your next appointment, please call your pharmacy*  Lab Work: NONE  If you have labs (blood work) drawn today and your tests are completely normal, you will receive your results only by:  MyChart Message (if you have MyChart) OR  A paper copy in the mail If you have any lab test that is abnormal or we need to change your treatment, we will call you to review the results.  Other Instructions TAKE AND LOG YOUR BLOOD PRESSURE INCREASE HYDRATION  Follow-Up: At [facility], you and your health needs are our priority.  As part of our continuing mission to provide you with exceptional heart care, we have created designated Provider Care Teams.  These Care Teams include your primary Cardiologist (physician) and Advanced Practice Providers (APPs -  Physician Assistants and Nurse Practitioners) who all work together to provide you with the care you need, when you need it.  Your next appointment:   2 month(s)  Provider:   Nicki Guadalajara, MD  or Edd Fabian, FNP

## 2023-08-06 ENCOUNTER — Other Ambulatory Visit: Payer: Self-pay

## 2023-08-06 NOTE — Patient Outreach (Signed)
Aging Gracefully Program  08/06/2023  Barbara [name] [birth year] [id number]   [facility] Evaluation Interviewer attempted to call patient on today regarding Aging Gracefully referral. No answer from patient after multiple rings. CMA left confidential voicemail for patient to return call.  Will attempt to call back within 1 week.   Vanice Sarah Care Management Assistant [phone]

## 2023-08-08 ENCOUNTER — Ambulatory Visit (INDEPENDENT_AMBULATORY_CARE_PROVIDER_SITE_OTHER): Payer: PPO

## 2023-08-08 DIAGNOSIS — I495 Sick sinus syndrome: Secondary | ICD-10-CM

## 2023-08-09 LAB — CUP PACEART REMOTE DEVICE CHECK
Battery Remaining Longevity: 132 mo
Battery Voltage: 3.05 V
Brady Statistic AP VP Percent: 0.04 %
Brady Statistic AP VS Percent: 99.95 %
Brady Statistic AS VP Percent: 0 %
Brady Statistic AS VS Percent: 0.01 %
Brady Statistic RA Percent Paced: 99.99 %
Brady Statistic RV Percent Paced: 0.04 %
Date Time Interrogation Session: 20241004051443
Implantable Lead Connection Status: 753985
Implantable Lead Connection Status: 753985
Implantable Lead Implant Date: 20230705
Implantable Lead Implant Date: 20230705
Implantable Lead Location: 753859
Implantable Lead Location: 753860
Implantable Lead Model: 3830
Implantable Lead Model: 5076
Implantable Pulse Generator Implant Date: 20230705
Lead Channel Impedance Value: 342 Ohm
Lead Channel Impedance Value: 361 Ohm
Lead Channel Impedance Value: 437 Ohm
Lead Channel Impedance Value: 475 Ohm
Lead Channel Pacing Threshold Amplitude: 1 V
Lead Channel Pacing Threshold Amplitude: 1.25 V
Lead Channel Pacing Threshold Pulse Width: 0.4 ms
Lead Channel Pacing Threshold Pulse Width: 0.4 ms
Lead Channel Sensing Intrinsic Amplitude: 1.375 mV
Lead Channel Sensing Intrinsic Amplitude: 1.375 mV
Lead Channel Sensing Intrinsic Amplitude: 10.25 mV
Lead Channel Sensing Intrinsic Amplitude: 10.25 mV
Lead Channel Setting Pacing Amplitude: 2.25 V
Lead Channel Setting Pacing Amplitude: 2.5 V
Lead Channel Setting Pacing Pulse Width: 0.4 ms
Lead Channel Setting Sensing Sensitivity: 0.9 mV
Zone Setting Status: 755011
Zone Setting Status: 755011

## 2023-08-15 NOTE — Telephone Encounter (Signed)
Left message to call back  Also sent message through mychart asking pt to call office to further discuss.

## 2023-08-20 NOTE — Progress Notes (Signed)
Remote pacemaker transmission.   

## 2023-08-22 ENCOUNTER — Other Ambulatory Visit: Payer: Self-pay | Admitting: Cardiovascular Disease

## 2023-08-26 ENCOUNTER — Other Ambulatory Visit: Payer: Self-pay

## 2023-08-26 NOTE — Patient Outreach (Signed)
Aging Gracefully Program  08/26/2023  Barbara [name] 06/20/1941 [id number]   [facility] Evaluation Interviewer made contact with patient. Aging Gracefully 9 month survey completed.    Vanice Sarah Care Management Assistant [phone]

## 2023-08-29 ENCOUNTER — Other Ambulatory Visit: Payer: Self-pay | Admitting: Family Medicine

## 2023-09-05 ENCOUNTER — Telehealth: Payer: Self-pay | Admitting: *Deleted

## 2023-09-05 NOTE — Addendum Note (Signed)
Addended by: Baird Lyons on: 09/05/2023 05:35 PM   Modules accepted: Orders

## 2023-09-05 NOTE — Progress Notes (Unsigned)
Cardiology Clinic Note   Patient Name: Barbara [name] Date of Encounter: 09/09/2023  Primary Care Provider:  [name], MD Primary Cardiologist:  Nicki Guadalajara, MD  Patient Profile     Barbara [name] 82 year old female presents to the clinic today for follow-up evaluation of her coronary artery disease, hypertension, and paroxysmal atrial fibrillation.  Past Medical History    Past Medical History:  Diagnosis Date   A-fib [facility])    Allergy    CAD (coronary artery disease)    sees Dr. Nicki Guadalajara  cardiac stents - 2000   CHF (congestive heart failure) (HCC)    Colon polyps    Complication of anesthesia    rash/hives with "caines"   Dyspnea    02/12/18 " when my heart gets out of rhythm, it has not been out of rhythym- since I have been on Tikosyn (11/2017)   Dysrhythmia    afib fib   GERD (gastroesophageal reflux disease)    takes OTC- Omeprazole- prn   Heart murmur    History of kidney stones    History of stress test    show normal perfusion without scar or ischemia, post EF 68%   Hx of echocardiogram    show an EF 55%-60% range with grade 1 diastolic dysfunction, she had mitral anular calcification with mild MR, moderate LA dilation and mild pulmonary hypertension with a PA estimated pressure of 39mm   Hyperlipidemia    Hypertension    NSTEMI (non-ST elevated myocardial infarction) (HCC)    Osteoarthritis    Pacemaker    Pneumonia    hx of 2015    PONV (postoperative nausea and vomiting)    Sleep apnea    Past Surgical History:  Procedure Laterality Date   CARDIAC CATHETERIZATION     11/2017   cardiac stents  2000   COLONOSCOPY  01-05-14   per Dr. Dorena Cookey, clear, no repeats needed    COLONOSCOPY     CORONARY STENT PLACEMENT  2000   in LAD   CYSTOSCOPY WITH RETROGRADE PYELOGRAM, URETEROSCOPY AND STENT PLACEMENT Left 06/11/2022   Procedure: CYSTOSCOPY WITH RETROGRADE PYELOGRAM, LEFT STENT PLACEMENT;  Surgeon: Crista Elliot, MD;   Location: [facility];  Service: Urology;  Laterality: Left;   CYSTOSCOPY/URETEROSCOPY/HOLMIUM LASER/STENT PLACEMENT Left 06/25/2022   Procedure: CYSTOSCOPY LEFT URETEROSCOPY/HOLMIUM LASER/STENT PLACEMENT;  Surgeon: Bjorn Pippin, MD;  Location: [facility];  Service: Urology;  Laterality: Left;  1 HR FOR THIS CASE   DIRECT LARYNGOSCOPY WITH RADIAESSE INJECTION N/A 02/13/2018   Procedure: DIRECT LARYNGOSCOPY WITH RADIAESSE INJECTION;  Surgeon: Christia Reading, MD;  Location: [facility] OR;  Service: ENT;  Laterality: N/A;   EYE SURGERY Left    CATARACT REMOVAL   KNEE ARTHROSCOPY Left 01/06/2015   Procedure: LEFT KNEE ARTHROSCOPY, abrasion chondroplasty of the medial femerol condryl,medial and lateral menisectomy, microfracture , synovectomy of the suprpatellar pouch;  Surgeon: Ranee Gosselin, MD;  Location: [facility];  Service: Orthopedics;  Laterality: Left;   LEFT HEART CATH AND CORONARY ANGIOGRAPHY N/A 02/26/2019   Procedure: LEFT HEART CATH AND CORONARY ANGIOGRAPHY;  Surgeon: Lennette Bihari, MD;  Location: [facility];  Service: Cardiovascular;  Laterality: N/A;   LEFT HEART CATH AND CORONARY ANGIOGRAPHY N/A 05/06/2022   Procedure: LEFT HEART CATH AND CORONARY ANGIOGRAPHY;  Surgeon: Yvonne Kendall, MD;  Location: [facility];  Service: Cardiovascular;  Laterality: N/A;   MICROLARYNGOSCOPY W/VOCAL CORD INJECTION N/A 08/07/2018   Procedure: MICROLARYNGOSCOPY WITH VOCAL CORD  INJECTION OF PROLARYN;  Surgeon: Christia Reading, MD;  Location: [facility] OR;  Service: ENT;  Laterality: N/A;  JET VENTILATION   PACEMAKER IMPLANT N/A 05/08/2022   Procedure: PACEMAKER IMPLANT;  Surgeon: Regan Lemming, MD;  Location: [facility];  Service: Cardiovascular;  Laterality: N/A;   REVERSE SHOULDER ARTHROPLASTY Right 03/23/2020   Procedure: REVERSE SHOULDER ARTHROPLASTY;  Surgeon: Yolonda Kida, MD;  Location: [facility] OR;  Service: Orthopedics;  Laterality: Right;   VAGINAL HYSTERECTOMY  1971    Allergies  Allergies   Allergen Reactions   Lidocaine Anaphylaxis   Morphine Other (See Comments)    "Body shuts down"   Procaine Hcl Anaphylaxis, Rash and Other (See Comments)    "Anything with 'caine' in it "   Sulfonamide Derivatives Hives   Amlodipine Swelling   Norco [Hydrocodone-Acetaminophen] Nausea And Vomiting and Other (See Comments)    States does ok with IV form    Tramadol Nausea And Vomiting   Vytorin [Ezetimibe-Simvastatin] Other (See Comments)    Joint pain   Tape Itching and Other (See Comments)    Patient prefers either paper tape or Coban wrap    History of Present Illness     Barbara [name] has a PMH of HTN, HLD, paroxysmal atrial fibrillation, PPM, lower extremity edema, elevated LP(a), CKD stage IIIb, and CAD.  She underwent cardiac catheterization with PCI to her LAD with BMS 09/1999.  She underwent nuclear stress testing 11/13 which showed normal perfusion.  Echocardiogram 05/04/2022 showed an LVEF of greater than 75%, intermediate diastolic parameters, mildly elevated PA systolic pressure, moderately dilated left atria, moderate mitral valve regurgitation.  Cardiac MRI confirmed apical hypertrophy.  She was noted to have patchy late gadolinium enhancement at the apex and RV insertion site.  Her LGE accounted for 2% of total myocardial mass.  Genetic testing was recommended and plan for 1/24.  She was seen in follow-up by Dr. Tresa Endo on 03/11/2023.  She had been seen and evaluated by Dr. Elberta Fortis who interrogated her pacemaker which showed normal device function.  She reported fatigue and feeling tired.  She continued to intermittently use her cane with ambulation.  She reported that on checking her blood pressure with waking up it was 155-160 systolic.  She noted improvement with medication.  Her lower extremity swelling improved after being off of amlodipine.  She continued to use her CPAP.  Compliance was verified.  She denied chest pain.  It was recommended that she increase her  furosemide to daily for help with her diastolic CHF and hypertension.  It was recommended that her hydralazine be uptitrated to 100 mg twice daily if blood pressure remained elevated.  She contacted the nurse triage line on 07/29/2023.  She indicated that she had been seen by her PCP who recommended she follow-up with cardiology for further evaluation and management of her elevated blood pressure.  She presented to the clinic 08/04/2023 for follow-up evaluation and stated her blood pressure continued to be elevated.  She brought a blood pressure log which showed blood pressures in the 150s-160s over 80s.  We reviewed the importance of low-sodium diet and secondary causes of hypertension.  She expressed understanding.  She continued to be compliant with her CPAP.  Her EKG  showed a paced rhythm at 73 bpm.   I will increased her hydralazine to 100 mg twice daily and planned follow-up in 2 months.  She presents to the clinic today for follow-up evaluation and states she feels that she  is taking too many medications.  We reviewed her blood pressure.  It has been better controlled.  She misunderstood instructions for hydralazine medication and was taking 75 mg in the morning and 200 mg at night.  She had been previously prescribed 50 mg tablets which she was using in the morning.  I reviewed dosing of her hydralazine.  I will prescribe 150 mg twice daily, have her maintain a blood pressure log, and follow-up in 2 to 3 months.  Today she denies chest pain, shortness of breath, lower extremity edema, fatigue, palpitations, melena, hematuria, hemoptysis, diaphoresis, weakness, presyncope, syncope, orthopnea, and PND.   Home Medications    Prior to Admission medications   Medication Sig Start Date End Date Taking? Authorizing Provider  acetaminophen (TYLENOL) 500 MG tablet Take 500 mg by mouth daily as needed for headache.    [provider]  amiodarone (PACERONE) 200 MG tablet Take 1 tablet (200 mg  total) by mouth daily. 12/20/22   Lennette Bihari, MD  apixaban Everlene Balls) 2.5 MG TABS tablet Take 1 tablet by mouth twice daily 04/01/23   Lennette Bihari, MD  aspirin 81 MG chewable tablet Chew 1 tablet (81 mg total) by mouth daily. 05/11/22   Leroy Sea, MD  Blood Pressure Monitoring (BLOOD PRESSURE MONITOR AUTOMAT) DEVI 1 Units/min by Does not apply route daily. 04/17/23   Lennette Bihari, MD  Cholecalciferol (VITAMIN D3) 2000 units TABS Take 2,000 Units by mouth daily.    [provider]  dextromethorphan (DELSYM) 30 MG/5ML liquid Take 15 mg by mouth daily as needed for cough.    [provider]  docusate sodium (COLACE) 100 MG capsule Take 100 mg by mouth as needed for mild constipation.    [provider]  empagliflozin (JARDIANCE) 10 MG TABS tablet Take 1 tablet (10 mg total) by mouth daily. 09/03/22   [name], MD  furosemide (LASIX) 20 MG tablet Take 1 tablet by mouth once daily 05/16/23   [name], MD  hydrALAZINE (APRESOLINE) 50 MG tablet Take 1.5 tabs in the AM and take 2 tabs in the PM 07/09/23   [name], MD  ipratropium (ATROVENT) 0.06 % nasal spray Place 2 sprays into both nostrils 2 (two) times daily as needed (allergies). 04/28/18   [provider]  isosorbide mononitrate (IMDUR) 30 MG 24 hr tablet Take 1 tablet (30 mg total) by mouth daily. 12/20/22   Lennette Bihari, MD  ketoconazole (NIZORAL) 2 % cream APPLY 1 APPLICATION TOPICALLY TWO TIMES DAILY AS NEEDED FOR IRRITATION 10/03/22   [name], MD  loratadine (CLARITIN) 10 MG tablet Take 10 mg by mouth daily as needed for allergies.    [provider]  LORazepam (ATIVAN) 0.5 MG tablet TAKE 1 TABLET BY MOUTH EVERY 6 HOURS AS NEEDED FOR ANXIETY 05/16/23   [name], MD  metoprolol succinate (TOPROL-XL) 100 MG 24 hr tablet Take 1 tablet (100 mg total) by mouth every evening. Take with or immediately following a meal. 07/09/23   [name], MD  Multiple Vitamin  (MULTIVITAMIN WITH MINERALS) TABS tablet Take 1 tablet by mouth daily.    [provider]  nitroGLYCERIN (NITROSTAT) 0.4 MG SL tablet DISSOLVE ONE TABLET UNDER THE TONGUE EVERY 5 MINUTES AS NEEDED FOR CHEST PAIN.  DO NOT EXCEED A TOTAL OF 3 DOSES IN 15 MINUTES 03/27/23   Lennette Bihari, MD  pantoprazole (PROTONIX) 40 MG tablet Take 1 tablet (40 mg  total) by mouth daily. 12/20/22 12/21/23  Lennette Bihari, MD  polyethylene glycol powder (GLYCOLAX/MIRALAX) 17 GM/SCOOP powder Take 17 g by mouth daily. 05/10/22   Leroy Sea, MD  potassium chloride (KLOR-CON) 10 MEQ tablet Take 1 tablet (10 mEq total) by mouth daily. 08/14/22   [name], MD  Probiotic Product (PROBIOTIC GUMMIES PO) Take 2 tablets by mouth every morning.    [provider]  rosuvastatin (CRESTOR) 40 MG tablet Take 1 tablet (40 mg total) by mouth daily. 11/11/22   Lennette Bihari, MD  triamcinolone (NASACORT) 55 MCG/ACT nasal inhaler Place 1 spray into both nostrils daily as needed (for allergies).  10/17/12   [name], MD    Family History    Family History  Problem Relation Age of Onset   Cancer Maternal Grandmother    Heart disease Maternal Grandfather    She indicated that her mother is deceased. She indicated that her father is deceased. She indicated that her maternal grandmother is deceased. She indicated that her maternal grandfather is deceased.  Social History    Social History   Socioeconomic History   Marital status: Widowed    Spouse name: Not on file   Number of children: 2   Years of education: college   Highest education level: High school graduate  Occupational History   Occupation: Retired  Tobacco Use   Smoking status: Former    Current packs/day: 0.00    Types: Cigarettes    Start date: 11/04/1956    Quit date: 11/04/1996    Years since quitting: 26.8   Smokeless tobacco: Never  Vaping Use   Vaping status: Never Used  Substance and Sexual Activity   Alcohol use: No     Alcohol/week: 0.0 standard drinks of alcohol   Drug use: No   Sexual activity: Not Currently  Other Topics Concern   Not on file  Social History Narrative   Not on file   Social Determinants of Health   Financial Resource Strain: Low Risk  (08/05/2022)   Overall Financial Resource Strain (CARDIA)    Difficulty of Paying Living Expenses: Not hard at all  Food Insecurity: No Food Insecurity (08/05/2022)   Hunger Vital Sign    Worried About Running Out of Food in the Last Year: Never true    Ran Out of Food in the Last Year: Never true  Transportation Needs: No Transportation Needs (08/05/2022)   PRAPARE - Administrator, Civil Service (Medical): No    Lack of Transportation (Non-Medical): No  Physical Activity: Insufficiently Active (07/02/2022)   Exercise Vital Sign    Days of Exercise per Week: 1 day    Minutes of Exercise per Session: 20 min  Stress: No Stress Concern Present (07/02/2022)   [employer] of Occupational Health - Occupational Stress Questionnaire    Feeling of Stress : Not at all  Social Connections: Moderately Integrated (07/02/2022)   Social Connection and Isolation Panel [NHANES]    Frequency of Communication with Friends and Family: More than three times a week    Frequency of Social Gatherings with Friends and Family: More than three times a week    Attends Religious Services: More than 4 times per year    Active Member of [employer] or Organizations: Yes    Attends Banker Meetings: More than 4 times per year    Marital Status: Widowed  Intimate Partner Violence: Not At Risk (07/02/2022)   Humiliation, Afraid, Rape, and  Kick questionnaire    Fear of Current or Ex-Partner: No    Emotionally Abused: No    Physically Abused: No    Sexually Abused: No     Review of Systems    General:  No chills, fever, night sweats or weight changes.  Cardiovascular:  No chest pain, dyspnea on exertion, edema, orthopnea, palpitations, paroxysmal  nocturnal dyspnea. Dermatological: No rash, lesions/masses Respiratory: No cough, dyspnea Urologic: No hematuria, dysuria Abdominal:   No nausea, vomiting, diarrhea, bright red blood per rectum, melena, or hematemesis Neurologic:  No visual changes, wkns, changes in mental status. All other systems reviewed and are otherwise negative except as noted above.  Physical Exam    VS:  BP (!) 132/58 (BP Location: Left Arm, Patient Position: Sitting, Cuff Size: Normal)   Pulse 81   Ht 5\' 7"  (1.702 m)   Wt 156 lb 9.6 oz (71 kg)   SpO2 96%   BMI 24.53 kg/m  , BMI Body mass index is 24.53 kg/m. GEN: Well nourished, well developed, in no acute distress. HEENT: normal. Neck: Supple, no JVD, carotid bruits, or masses. Cardiac: RRR, no murmurs, rubs, or gallops. No clubbing, cyanosis, bilateral lower extremity ankle edema nonpitting.  Radials/DP/PT 2+ and equal bilaterally.  Respiratory:  Respirations regular and unlabored, clear to auscultation bilaterally. GI: Soft, nontender, nondistended, BS + x 4. MS: no deformity or atrophy. Skin: warm and dry, no rash. Neuro:  Strength and sensation are intact. Psych: Normal affect.  Accessory Clinical Findings    Recent Labs: 05/02/2023: TSH 2.170 07/11/2023: ALT 42   Recent Lipid Panel    Component Value Date/Time   CHOL 148 06/13/2021 1459   TRIG 244.0 (H) 06/13/2021 1459   HDL 57.60 06/13/2021 1459   CHOLHDL 3 06/13/2021 1459   VLDL 48.8 (H) 06/13/2021 1459   LDLCALC 50 10/08/2020 0313   LDLDIRECT 54.0 06/13/2021 1459         ECG personally reviewed by me today-   none today.  Cardiac catheterization 02/26/2019  Prox RCA to Mid RCA lesion is 15% stenosed. Ost LAD to Prox LAD lesion is 20% stenosed. Prox Cx lesion is 20% stenosed.   Mild non-obstructive coronary artery disease with a patent proximal LAD stent with mild 20% intimal hyperplasia (inserted  09/1999); 20% proximal left circumflex narrowing, and mild irregularity of 10 to  15% in the mid RCA.   Hyperdynamic LV function with an "Ace of Spade "configuration and near cavity obliteration in the mid to apical segment during systole.  There is evidence for left ventricular hypertrophy.  LVEDP is 16 mm.   RECOMMENDATION: Medical therapy with optimal blood pressure control, lipid management, and resumption of Eliquis tomorrow     CATH: 05/06/2022 Conclusions: Mild-moderate, non-obstructive coronary artery disease. Patent proximal/mid LAD stent with up to 30% in-stent restenosis. Low left ventricular filling pressure (LVEDP ~5 mmHg).   Recommendations: Continue secondary prevention of coronary artery disease. Proceed with pacemaker placement per Dr. Graciela Husbands.     Echocardiogram 05/04/2022  IMPRESSIONS     1. Left ventricular ejection fraction, by estimation, is >75%. The left  ventricle has hyperdynamic function. The left ventricle has no regional  wall motion abnormalities. Left ventricular diastolic parameters are  indeterminate. Elevated left atrial  pressure.   2. Right ventricular systolic function is normal. The right ventricular  size is normal. There is mildly elevated pulmonary artery systolic  pressure.   3. Left atrial size was moderately dilated.   4. The mitral valve is  normal in structure. Moderate mitral valve  regurgitation. No evidence of mitral stenosis.   5. The aortic valve is tricuspid. Aortic valve regurgitation is not  visualized. Aortic valve sclerosis is present, with no evidence of aortic  valve stenosis.   6. The inferior vena cava is normal in size with greater than 50%  respiratory variability, suggesting right atrial pressure of 3 mmHg.   FINDINGS   Left Ventricle: Left ventricular ejection fraction, by estimation, is  >75%. The left ventricle has hyperdynamic function. The left ventricle has  no regional wall motion abnormalities. Definity contrast agent was given  IV to delineate the left  ventricular endocardial borders.  The left ventricular internal cavity size  was normal in size. There is no left ventricular hypertrophy. Left  ventricular diastolic parameters are indeterminate. Elevated left atrial  pressure.   Right Ventricle: The right ventricular size is normal. Right ventricular  systolic function is normal. There is mildly elevated pulmonary artery  systolic pressure. The tricuspid regurgitant velocity is 3.14 m/s, and  with an assumed right atrial pressure  of 3 mmHg, the estimated right ventricular systolic pressure is 42.4 mmHg.   Left Atrium: Left atrial size was moderately dilated.   Right Atrium: Right atrial size was normal in size.   Pericardium: There is no evidence of pericardial effusion.   Mitral Valve: The mitral valve is normal in structure. Moderate mitral  valve regurgitation. No evidence of mitral valve stenosis. MV peak  gradient, 8.6 mmHg. The mean mitral valve gradient is 2.0 mmHg.   Tricuspid Valve: The tricuspid valve is normal in structure. Tricuspid  valve regurgitation is mild . No evidence of tricuspid stenosis.   Aortic Valve: The aortic valve is tricuspid. Aortic valve regurgitation is  not visualized. Aortic valve sclerosis is present, with no evidence of  aortic valve stenosis.   Pulmonic Valve: The pulmonic valve was normal in structure. Pulmonic valve  regurgitation is trivial. No evidence of pulmonic stenosis.   Aorta: The aortic root is normal in size and structure.   Venous: The inferior vena cava is normal in size with greater than 50%  respiratory variability, suggesting right atrial pressure of 3 mmHg.   IAS/Shunts: No atrial level shunt detected by color flow Doppler.        Assessment & Plan   1.  Essential hypertension-BP today 132/58.  She misunderstood medication instructions and was taking 75 mg of hydralazine and the morning and 200 mg in the evening. Maintain blood pressure log Change hydralazine to 150 mg twice daily Continue  metoprolol, Imdur  Coronary artery disease-denies anginal symptoms.  With cardiac catheterization 02/26/2020 which showed patent LAD stent and nonobstructive mild coronary disease in her circumflex and RCA. Continue aspirin, Imdur, metoprolol Heart healthy low-sodium high-fiber diet-reviewed  Paroxysmal atrial fibrillation-heart rate today 81.  Use compliance with apixaban and denies bleeding issues.  No recent trauma or falls. Continue apixaban, amiodarone, metoprolol Avoid triggers caffeine, chocolate, EtOH, dehydration etc.-reviewed  OSA-reports compliance with CPAP.  Waking up well rested. Continue CPAP use  Exertional dyspnea-breathing stable.  Continues to use cane with ambulation. Continue CPAP use Increase physical activity as tolerated Heart healthy low-sodium diet  Disposition: Follow-up with Dr. Tresa Endo or me in 2-3 months.   Thomasene Ripple. Livi Mcgann NP-C     09/09/2023, 12:03 PM [facility] [address] Office [phone] Fax [phone]    I spent 14 minutes examining this patient, reviewing medications, and using patient centered shared  decision making involving her cardiac care.  Prior to her visit I spent greater than 20 minutes reviewing her past medical history,  medications, and prior cardiac tests.

## 2023-09-05 NOTE — Telephone Encounter (Addendum)
Was able to reach patient after several months of trying. Advised to stop Amiodarone. Aware I will follow up in 2 months to arrange follow up blood work. Patient verbalized understanding and agreeable to plan.        08/15/23 12:17 PM Note Left message to call back   Also sent message through mychart asking pt to call office to further discuss.        08/15/23 12:12 PM You attempted to contact Barbara [name] (Left Message)   July 18, 2023    07/18/23 10:32 AM You attempted to contact Barbara [name], Barbara [name] (Left Message)  Me    07/18/23 10:32 AM Note ----- Message from Will Jorja Loa sent at 07/15/2023  6:37 PM EDT ----- LFTs remain elevated. Hold amiodarone and recheck in 3 months

## 2023-09-09 ENCOUNTER — Ambulatory Visit: Payer: PPO | Attending: General Practice | Admitting: General Practice

## 2023-09-09 ENCOUNTER — Encounter: Payer: Self-pay | Admitting: General Practice

## 2023-09-09 VITALS — BP 132/58 | HR 81 | Ht 67.0 in | Wt 156.6 lb

## 2023-09-09 DIAGNOSIS — G4733 Obstructive sleep apnea (adult) (pediatric): Secondary | ICD-10-CM

## 2023-09-09 DIAGNOSIS — R0609 Other forms of dyspnea: Secondary | ICD-10-CM | POA: Diagnosis not present

## 2023-09-09 DIAGNOSIS — I480 Paroxysmal atrial fibrillation: Secondary | ICD-10-CM | POA: Diagnosis not present

## 2023-09-09 DIAGNOSIS — I2510 Atherosclerotic heart disease of native coronary artery without angina pectoris: Secondary | ICD-10-CM

## 2023-09-09 DIAGNOSIS — I10 Essential (primary) hypertension: Secondary | ICD-10-CM

## 2023-09-09 MED ORDER — HYDRALAZINE HCL 100 MG PO TABS: 150.0000 mg | ORAL_TABLET | Freq: Two times a day (BID) | ORAL | 3 refills | Status: DC

## 2023-09-09 NOTE — Patient Instructions (Signed)
Medication Instructions:  TAKE YOUR HYDRALAZINE 150MG  TWICE DAILY  *If you need a refill on your cardiac medications before your next appointment, please call your pharmacy*  Lab Work: NONE  Other Instructions TAKE AND LOG YOUR BLOOD PRESSURE TWICE DAILY AT LEAST 1-2 HOURS AFTER TAKING HYDRALAZINE MAINTAIN YOUR PHYSICAL ACTIVITY PLEASE READ AND FOLLOW ATTACHED  SALTY 6   Follow-Up: At [facility], you and your health needs are our priority.  As part of our continuing mission to provide you with exceptional heart care, we have created designated Provider Care Teams.  These Care Teams include your primary Cardiologist (physician) and Advanced Practice Providers (APPs -  Physician Assistants and Nurse Practitioners) who all work together to provide you with the care you need, when you need it.  Your next appointment:   2-3 month(s)  Provider:   Nicki Guadalajara, MD  or Edd Fabian, FNP

## 2023-09-16 ENCOUNTER — Ambulatory Visit: Payer: PPO | Admitting: Podiatry

## 2023-09-16 ENCOUNTER — Encounter: Payer: Self-pay | Admitting: Podiatry

## 2023-09-16 DIAGNOSIS — M79674 Pain in right toe(s): Secondary | ICD-10-CM

## 2023-09-16 DIAGNOSIS — B351 Tinea unguium: Secondary | ICD-10-CM | POA: Diagnosis not present

## 2023-09-16 DIAGNOSIS — M79675 Pain in left toe(s): Secondary | ICD-10-CM

## 2023-09-21 NOTE — Progress Notes (Signed)
  Subjective:  Patient ID: Barbara [name], female    DOB: [birth year],  MRN: [id number]  82 y.o. female presents painful thick toenails that are difficult to trim. Pain interferes with ambulation. Aggravating factors include wearing enclosed shoe gear. Pain is relieved with periodic professional debridement.  Chief Complaint  Patient presents with   Routine Post Op    RFC PATIENT STATES SHE HER LAST PCP WAS LAST MONTH    New problem(s): None   PCP is [name], MD.  Allergies  Allergen Reactions   Lidocaine Anaphylaxis   Morphine Other (See Comments)    "Body shuts down"   Procaine Hcl Anaphylaxis, Rash and Other (See Comments)    "Anything with 'caine' in it "   Sulfonamide Derivatives Hives   Amlodipine Swelling   Norco [Hydrocodone-Acetaminophen] Nausea And Vomiting and Other (See Comments)    States does ok with IV form    Tramadol Nausea And Vomiting   Vytorin [Ezetimibe-Simvastatin] Other (See Comments)    Joint pain   Tape Itching and Other (See Comments)    Patient prefers either paper tape or Coban wrap    Review of Systems: Negative except as noted in the HPI.   Objective:  Barbara REDEKER is a pleasant 82 y.o. female WD, WN in NAD. AAO x 3.  Vascular Examination: Vascular status intact b/l with palpable pedal pulses. CFT immediate b/l. Pedal hair present. No edema. No pain with calf compression b/l. Skin temperature gradient WNL b/l. No varicosities noted. No cyanosis or clubbing noted.  Neurological Examination: Sensation grossly intact b/l with 10 gram monofilament. Vibratory sensation intact b/l.  Dermatological Examination: Pedal skin with normal turgor, texture and tone b/l. No open wounds nor interdigital macerations noted. Toenails 1-5 b/l thick, discolored, elongated with subungual debris and pain on dorsal palpation. No hyperkeratotic lesions noted b/l.   Musculoskeletal Examination: Muscle strength 5/5 to b/l LE.  No pain, crepitus  noted b/l. No gross pedal deformities. Patient ambulates independently without assistive aids.   Radiographs: None  Last A1c:       No data to display         Assessment:   1. Pain due to onychomycosis of toenails of both feet    Plan:  Patient was evaluated and treated. All patient's and/or POA's questions/concerns addressed on today's visit. Mycotic toenails 1-5 debrided in length and girth without incident. Continue soft, supportive shoe gear daily. Report any pedal injuries to medical professional. Call office if there are any quesitons/concerns. -Patient/POA to call should there be question/concern in the interim.  Return in about 3 months (around 12/17/2023).  Freddie Breech, DPM

## 2023-09-26 ENCOUNTER — Encounter (HOSPITAL_COMMUNITY): Payer: Self-pay

## 2023-09-26 ENCOUNTER — Emergency Department (HOSPITAL_COMMUNITY): Payer: PPO

## 2023-09-26 ENCOUNTER — Inpatient Hospital Stay (HOSPITAL_COMMUNITY)
Admission: EM | Admit: 2023-09-26 | Discharge: 2023-09-30 | DRG: 522 | Disposition: A | Discharge status: Skilled Nursing Facility | Payer: PPO | Attending: Family Medicine | Admitting: Family Medicine

## 2023-09-26 DIAGNOSIS — W19XXXA Unspecified fall, initial encounter: Secondary | ICD-10-CM | POA: Diagnosis not present

## 2023-09-26 DIAGNOSIS — I480 Paroxysmal atrial fibrillation: Secondary | ICD-10-CM | POA: Diagnosis not present

## 2023-09-26 DIAGNOSIS — K219 Gastro-esophageal reflux disease without esophagitis: Secondary | ICD-10-CM | POA: Diagnosis present

## 2023-09-26 DIAGNOSIS — Z950 Presence of cardiac pacemaker: Secondary | ICD-10-CM

## 2023-09-26 DIAGNOSIS — I2510 Atherosclerotic heart disease of native coronary artery without angina pectoris: Secondary | ICD-10-CM | POA: Diagnosis not present

## 2023-09-26 DIAGNOSIS — N1832 Chronic kidney disease, stage 3b: Secondary | ICD-10-CM | POA: Diagnosis present

## 2023-09-26 DIAGNOSIS — G4730 Sleep apnea, unspecified: Secondary | ICD-10-CM | POA: Diagnosis not present

## 2023-09-26 DIAGNOSIS — I272 Pulmonary hypertension, unspecified: Secondary | ICD-10-CM | POA: Diagnosis not present

## 2023-09-26 DIAGNOSIS — Z888 Allergy status to other drugs, medicaments and biological substances status: Secondary | ICD-10-CM | POA: Diagnosis not present

## 2023-09-26 DIAGNOSIS — Z7982 Long term (current) use of aspirin: Secondary | ICD-10-CM

## 2023-09-26 DIAGNOSIS — R2689 Other abnormalities of gait and mobility: Secondary | ICD-10-CM | POA: Diagnosis not present

## 2023-09-26 DIAGNOSIS — Z955 Presence of coronary angioplasty implant and graft: Secondary | ICD-10-CM | POA: Diagnosis not present

## 2023-09-26 DIAGNOSIS — I10 Essential (primary) hypertension: Secondary | ICD-10-CM | POA: Diagnosis not present

## 2023-09-26 DIAGNOSIS — W010XXA Fall on same level from slipping, tripping and stumbling without subsequent striking against object, initial encounter: Secondary | ICD-10-CM | POA: Diagnosis present

## 2023-09-26 DIAGNOSIS — Z9842 Cataract extraction status, left eye: Secondary | ICD-10-CM | POA: Diagnosis not present

## 2023-09-26 DIAGNOSIS — Z79899 Other long term (current) drug therapy: Secondary | ICD-10-CM

## 2023-09-26 DIAGNOSIS — Z96611 Presence of right artificial shoulder joint: Secondary | ICD-10-CM | POA: Diagnosis not present

## 2023-09-26 DIAGNOSIS — M25552 Pain in left hip: Secondary | ICD-10-CM | POA: Diagnosis not present

## 2023-09-26 DIAGNOSIS — N1831 Chronic kidney disease, stage 3a: Secondary | ICD-10-CM | POA: Diagnosis present

## 2023-09-26 DIAGNOSIS — N183 Chronic kidney disease, stage 3 unspecified: Secondary | ICD-10-CM | POA: Diagnosis not present

## 2023-09-26 DIAGNOSIS — Z8249 Family history of ischemic heart disease and other diseases of the circulatory system: Secondary | ICD-10-CM

## 2023-09-26 DIAGNOSIS — E785 Hyperlipidemia, unspecified: Secondary | ICD-10-CM | POA: Diagnosis not present

## 2023-09-26 DIAGNOSIS — Z7984 Long term (current) use of oral hypoglycemic drugs: Secondary | ICD-10-CM

## 2023-09-26 DIAGNOSIS — Z885 Allergy status to narcotic agent status: Secondary | ICD-10-CM

## 2023-09-26 DIAGNOSIS — I959 Hypotension, unspecified: Secondary | ICD-10-CM | POA: Diagnosis not present

## 2023-09-26 DIAGNOSIS — Z87891 Personal history of nicotine dependence: Secondary | ICD-10-CM | POA: Diagnosis not present

## 2023-09-26 DIAGNOSIS — S72002A Fracture of unspecified part of neck of left femur, initial encounter for closed fracture: Secondary | ICD-10-CM | POA: Diagnosis not present

## 2023-09-26 DIAGNOSIS — R011 Cardiac murmur, unspecified: Secondary | ICD-10-CM | POA: Diagnosis not present

## 2023-09-26 DIAGNOSIS — M6281 Muscle weakness (generalized): Secondary | ICD-10-CM | POA: Diagnosis not present

## 2023-09-26 DIAGNOSIS — Z7401 Bed confinement status: Secondary | ICD-10-CM | POA: Diagnosis not present

## 2023-09-26 DIAGNOSIS — I5032 Chronic diastolic (congestive) heart failure: Secondary | ICD-10-CM | POA: Diagnosis present

## 2023-09-26 DIAGNOSIS — I252 Old myocardial infarction: Secondary | ICD-10-CM | POA: Diagnosis not present

## 2023-09-26 DIAGNOSIS — S7222XD Displaced subtrochanteric fracture of left femur, subsequent encounter for closed fracture with routine healing: Secondary | ICD-10-CM | POA: Diagnosis not present

## 2023-09-26 DIAGNOSIS — Y92009 Unspecified place in unspecified non-institutional (private) residence as the place of occurrence of the external cause: Secondary | ICD-10-CM

## 2023-09-26 DIAGNOSIS — Z882 Allergy status to sulfonamides status: Secondary | ICD-10-CM

## 2023-09-26 DIAGNOSIS — I130 Hypertensive heart and chronic kidney disease with heart failure and stage 1 through stage 4 chronic kidney disease, or unspecified chronic kidney disease: Secondary | ICD-10-CM | POA: Diagnosis present

## 2023-09-26 DIAGNOSIS — Z91048 Other nonmedicinal substance allergy status: Secondary | ICD-10-CM

## 2023-09-26 DIAGNOSIS — I5033 Acute on chronic diastolic (congestive) heart failure: Secondary | ICD-10-CM | POA: Diagnosis not present

## 2023-09-26 DIAGNOSIS — S7222XA Displaced subtrochanteric fracture of left femur, initial encounter for closed fracture: Secondary | ICD-10-CM | POA: Diagnosis not present

## 2023-09-26 DIAGNOSIS — Z9181 History of falling: Secondary | ICD-10-CM | POA: Diagnosis not present

## 2023-09-26 DIAGNOSIS — Z7901 Long term (current) use of anticoagulants: Secondary | ICD-10-CM

## 2023-09-26 DIAGNOSIS — M1990 Unspecified osteoarthritis, unspecified site: Secondary | ICD-10-CM | POA: Diagnosis present

## 2023-09-26 DIAGNOSIS — S79929A Unspecified injury of unspecified thigh, initial encounter: Secondary | ICD-10-CM | POA: Diagnosis not present

## 2023-09-26 DIAGNOSIS — F419 Anxiety disorder, unspecified: Secondary | ICD-10-CM | POA: Diagnosis not present

## 2023-09-26 DIAGNOSIS — Z96642 Presence of left artificial hip joint: Secondary | ICD-10-CM | POA: Diagnosis not present

## 2023-09-26 DIAGNOSIS — I129 Hypertensive chronic kidney disease with stage 1 through stage 4 chronic kidney disease, or unspecified chronic kidney disease: Secondary | ICD-10-CM | POA: Diagnosis not present

## 2023-09-26 DIAGNOSIS — R41841 Cognitive communication deficit: Secondary | ICD-10-CM | POA: Diagnosis not present

## 2023-09-26 DIAGNOSIS — S72042A Displaced fracture of base of neck of left femur, initial encounter for closed fracture: Secondary | ICD-10-CM | POA: Diagnosis not present

## 2023-09-26 LAB — CBG MONITORING, ED: Glucose-Capillary: 91 mg/dL (ref 70–99)

## 2023-09-26 LAB — BASIC METABOLIC PANEL
Anion gap: 12 (ref 5–15)
BUN: 17 mg/dL (ref 8–23)
CO2: 24 mmol/L (ref 22–32)
Calcium: 9.8 mg/dL (ref 8.9–10.3)
Chloride: 103 mmol/L (ref 98–111)
Creatinine, Ser: 1.53 mg/dL — ABNORMAL HIGH (ref 0.44–1.00)
GFR, Estimated: 33 mL/min — ABNORMAL LOW (ref >60)
Glucose, Bld: 100 mg/dL — ABNORMAL HIGH (ref 70–99)
Potassium: 3.4 mmol/L — ABNORMAL LOW (ref 3.5–5.1)
Sodium: 139 mmol/L (ref 135–145)

## 2023-09-26 LAB — CBC WITH DIFFERENTIAL/PLATELET
Abs Immature Granulocytes: 0.03 10*3/uL (ref 0.00–0.07)
Basophils Absolute: 0 10*3/uL (ref 0.0–0.1)
Basophils Relative: 0 %
Eosinophils Absolute: 0.1 10*3/uL (ref 0.0–0.5)
Eosinophils Relative: 2 %
HCT: 35.7 % — ABNORMAL LOW (ref 36.0–46.0)
Hemoglobin: 10.9 g/dL — ABNORMAL LOW (ref 12.0–15.0)
Immature Granulocytes: 1 %
Lymphocytes Relative: 9 %
Lymphs Abs: 0.5 10*3/uL — ABNORMAL LOW (ref 0.7–4.0)
MCH: 26.8 pg (ref 26.0–34.0)
MCHC: 30.5 g/dL (ref 30.0–36.0)
MCV: 87.7 fL (ref 80.0–100.0)
Monocytes Absolute: 0.4 10*3/uL (ref 0.1–1.0)
Monocytes Relative: 8 %
Neutro Abs: 4.2 10*3/uL (ref 1.7–7.7)
Neutrophils Relative %: 80 %
Platelets: 183 10*3/uL (ref 150–400)
RBC: 4.07 MIL/uL (ref 3.87–5.11)
RDW: 16.2 % — ABNORMAL HIGH (ref 11.5–15.5)
WBC: 5.3 10*3/uL (ref 4.0–10.5)
nRBC: 0 % (ref 0.0–0.2)

## 2023-09-26 LAB — PROTIME-INR
INR: 1.3 — ABNORMAL HIGH (ref 0.8–1.2)
Prothrombin Time: 16.1 s — ABNORMAL HIGH (ref 11.4–15.2)

## 2023-09-26 LAB — TYPE AND SCREEN
ABO/RH(D): B POS
Antibody Screen: NEGATIVE

## 2023-09-26 LAB — ABO/RH: ABO/RH(D): B POS

## 2023-09-26 MED ORDER — TRANEXAMIC ACID-NACL 1000-0.7 MG/100ML-% IV SOLN
1000.0000 mg | INTRAVENOUS | Status: AC
  Administered 2023-09-27: 1000 mg via INTRAVENOUS

## 2023-09-26 MED ORDER — PANTOPRAZOLE SODIUM 40 MG PO TBEC
40.0000 mg | DELAYED_RELEASE_TABLET | Freq: Every day | ORAL | Status: DC
  Administered 2023-09-28 – 2023-09-30 (×3): 40 mg via ORAL
  Filled 2023-09-26 (×3): qty 1

## 2023-09-26 MED ORDER — HYDRALAZINE HCL 50 MG PO TABS
150.0000 mg | ORAL_TABLET | Freq: Two times a day (BID) | ORAL | Status: DC
  Administered 2023-09-26 – 2023-09-30 (×7): 150 mg via ORAL
  Filled 2023-09-26 (×7): qty 3

## 2023-09-26 MED ORDER — FUROSEMIDE 20 MG PO TABS: 20.0000 mg | ORAL_TABLET | Freq: Every day | ORAL | Status: DC

## 2023-09-26 MED ORDER — CHLORHEXIDINE GLUCONATE 4 % EX SOLN
60.0000 mL | Freq: Once | CUTANEOUS | Status: AC
  Administered 2023-09-27: 4 via TOPICAL

## 2023-09-26 MED ORDER — FENTANYL CITRATE PF 50 MCG/ML IJ SOSY
12.5000 ug | PREFILLED_SYRINGE | Freq: Two times a day (BID) | INTRAMUSCULAR | Status: DC | PRN
  Administered 2023-09-26: 12.5 ug via INTRAVENOUS
  Filled 2023-09-26: qty 1

## 2023-09-26 MED ORDER — DOCUSATE SODIUM 100 MG PO CAPS
100.0000 mg | ORAL_CAPSULE | Freq: Two times a day (BID) | ORAL | Status: DC
  Administered 2023-09-27 – 2023-09-30 (×5): 100 mg via ORAL
  Filled 2023-09-26 (×2): qty 1

## 2023-09-26 MED ORDER — MORPHINE SULFATE (PF) 2 MG/ML IV SOLN: 0.5000 mg | INTRAVENOUS | Status: DC | PRN

## 2023-09-26 MED ORDER — NITROGLYCERIN 0.4 MG SL SUBL: 0.4000 mg | SUBLINGUAL_TABLET | SUBLINGUAL | Status: DC | PRN

## 2023-09-26 MED ORDER — LORAZEPAM 0.5 MG PO TABS
0.5000 mg | ORAL_TABLET | Freq: Four times a day (QID) | ORAL | Status: DC | PRN
  Administered 2023-09-26: 0.5 mg via ORAL
  Filled 2023-09-26: qty 1

## 2023-09-26 MED ORDER — FENTANYL CITRATE PF 50 MCG/ML IJ SOSY
12.5000 ug | PREFILLED_SYRINGE | INTRAMUSCULAR | Status: DC | PRN
  Administered 2023-09-26 – 2023-09-27 (×2): 12.5 ug via INTRAVENOUS
  Filled 2023-09-26 (×2): qty 1

## 2023-09-26 MED ORDER — HEPARIN SODIUM (PORCINE) 5000 UNIT/ML IJ SOLN
5000.0000 [IU] | Freq: Three times a day (TID) | INTRAMUSCULAR | Status: DC
  Administered 2023-09-26 – 2023-09-28 (×5): 5000 [IU] via SUBCUTANEOUS
  Filled 2023-09-26 (×5): qty 1

## 2023-09-26 MED ORDER — METOPROLOL SUCCINATE ER 50 MG PO TB24
100.0000 mg | ORAL_TABLET | Freq: Every evening | ORAL | Status: DC
  Administered 2023-09-26 – 2023-09-29 (×4): 100 mg via ORAL
  Filled 2023-09-26 (×4): qty 2

## 2023-09-26 MED ORDER — DOCUSATE SODIUM 100 MG PO CAPS: 100.0000 mg | ORAL_CAPSULE | ORAL | Status: DC | PRN

## 2023-09-26 MED ORDER — ROSUVASTATIN CALCIUM 20 MG PO TABS
40.0000 mg | ORAL_TABLET | Freq: Every day | ORAL | Status: DC
  Administered 2023-09-26 – 2023-09-30 (×4): 40 mg via ORAL
  Filled 2023-09-26 (×4): qty 2

## 2023-09-26 MED ORDER — POVIDONE-IODINE 10 % EX SWAB
2.0000 | Freq: Once | CUTANEOUS | Status: AC
  Administered 2023-09-27: 2 via TOPICAL

## 2023-09-26 MED ORDER — ONDANSETRON HCL 4 MG/2ML IJ SOLN
4.0000 mg | Freq: Once | INTRAMUSCULAR | Status: AC
  Administered 2023-09-26: 4 mg via INTRAVENOUS
  Filled 2023-09-26: qty 2

## 2023-09-26 MED ORDER — SODIUM CHLORIDE 0.9 % IV SOLN: INTRAVENOUS | Status: AC

## 2023-09-26 MED ORDER — ISOSORBIDE MONONITRATE ER 30 MG PO TB24
30.0000 mg | ORAL_TABLET | Freq: Every day | ORAL | Status: DC
  Administered 2023-09-28 – 2023-09-30 (×3): 30 mg via ORAL
  Filled 2023-09-26 (×3): qty 1

## 2023-09-26 MED ORDER — SENNA 8.6 MG PO TABS
1.0000 | ORAL_TABLET | Freq: Two times a day (BID) | ORAL | Status: DC
  Administered 2023-09-27 – 2023-09-30 (×6): 8.6 mg via ORAL
  Filled 2023-09-26 (×7): qty 1

## 2023-09-26 MED ORDER — FENTANYL CITRATE PF 50 MCG/ML IJ SOSY
50.0000 ug | PREFILLED_SYRINGE | Freq: Once | INTRAMUSCULAR | Status: AC
  Administered 2023-09-26: 50 ug via INTRAVENOUS
  Filled 2023-09-26: qty 1

## 2023-09-26 MED ORDER — CEFAZOLIN SODIUM-DEXTROSE 2-4 GM/100ML-% IV SOLN
2.0000 g | INTRAVENOUS | Status: AC
  Administered 2023-09-27: 2 g via INTRAVENOUS

## 2023-09-26 NOTE — Anesthesia Preprocedure Evaluation (Signed)
Anesthesia Evaluation  Patient identified by MRN, date of birth, ID band Patient awake    Reviewed: Allergy & Precautions, NPO status , Patient's Chart, lab work & pertinent test results  History of Anesthesia Complications (+) PONV and history of anesthetic complications  Airway Mallampati: II  TM Distance: >3 FB Neck ROM: Full    Dental  (+) Dental Advisory Given, Teeth Intact   Pulmonary shortness of breath, sleep apnea and Continuous Positive Airway Pressure Ventilation , pneumonia, former smoker New O2 requirement   Pulmonary exam normal breath sounds clear to auscultation       Cardiovascular hypertension, Pt. on medications pulmonary hypertension+ CAD, + Past MI (Non Stemi 05/03/2022), + Cardiac Stents and +CHF  + dysrhythmias Atrial Fibrillation + pacemaker + Valvular Problems/Murmurs MR  Rhythm:Regular Rate:Normal + Systolic murmurs 11/04/9145 Echo 1. Left ventricular ejection fraction, by estimation, is >75%. The left ventricle has hyperdynamic function. The left ventricle has no regional wall motion abnormalities. Left ventricular diastolic parameters are indeterminate. Elevated left atrial pressure.  2. Right ventricular systolic function is normal. The right ventricular size is normal. There is mildly elevated pulmonary artery systolic pressure.  3. Left atrial size was moderately dilated.  4. The mitral valve is normal in structure. Moderate mitral valve regurgitation. No evidence of mitral stenosis.  5. The aortic valve is tricuspid. Aortic valve regurgitation is not visualized. Aortic valve sclerosis is present, with no evidence of aortic valve stenosis.  6. The inferior vena cava is normal in size with greater than 50% respiratory variability, suggesting right atrial pressure of 3 mmHg.    10/08/2020; TTE 1. Left ventricular ejection fraction, by estimation, is 70 to 75%. The left ventricle has hyperdynamic  function. The left ventricle has no regional wall motion abnormalities. There is moderate eccentric left ventricular hypertrophy of the apical segment. Left ventricular diastolic parameters are indeterminate. Elevated left ventricular end-diastolic pressure.  2. Right ventricular systolic function is normal. The right ventricular size is normal. Tricuspid regurgitation signal is inadequate for assessing PA pressure.  3. The mitral valve is abnormal. Mild mitral valve regurgitation.  Moderate mitral annular calcification.  4. The aortic valve is grossly normal. There is mild calcification of the aortic valve. There is mild thickening of the aortic valve. Aortic valve regurgitation is not visualized. Mild aortic valve sclerosis is present,  with no evidence of aortic valve stenosis.    Neuro/Psych  Neuromuscular disease    GI/Hepatic ,GERD  ,,  Endo/Other    Renal/GU CRFRenal diseaseLab Results      Component                Value               Date                      CREATININE               2.03 (H)            06/10/2022                BUN                      27 (H)              06/10/2022                NA  141                 06/10/2022                K                        3.8                 06/10/2022                   Musculoskeletal  (+) Arthritis ,    Abdominal   Peds  Hematology  (+) Blood dyscrasia, anemia Lab Results      Component                Value               Date                      WBC                      11.1 (H)            06/10/2022                HGB                      11.7 (L)            06/10/2022                HCT                      37.8                06/10/2022                  PLT                      210                 06/10/2022              Anesthesia Other Findings All: Lidocaine, Procaine, Morphine, sulfa, norco, Vytorin,Tramadol  Reproductive/Obstetrics                              Anesthesia Physical Anesthesia Plan  ASA: 3  Anesthesia Plan: General   Post-op Pain Management: Tylenol PO (pre-op)*   Induction: Intravenous  PONV Risk Score and Plan: 4 or greater and Treatment may vary due to age or medical condition, Ondansetron and Dexamethasone  Airway Management Planned: Oral ETT  Additional Equipment: None  Intra-op Plan:   Post-operative Plan: Extubation in OR  Informed Consent: I have reviewed the patients History and Physical, chart, labs and discussed the procedure including the risks, benefits and alternatives for the proposed anesthesia with the patient or authorized representative who has indicated his/her understanding and acceptance.     Dental advisory given  Plan Discussed with: CRNA  Anesthesia Plan Comments:         Anesthesia Quick Evaluation

## 2023-09-26 NOTE — H&P (Addendum)
History and Physical    Barbara [name] WGN:[id number] DOB: 08-10-1941 DOA: 09/26/2023  PCP: [name], MD   Patient coming from: Home.  I have personally briefly reviewed patient's old medical records in [facility]  Chief Complaint:  s/p tripped and fell with left hip pain.  HPI: Barbara [name] is a 82 y.o. female with PMH significant for CAD, diastolic CHF, A-fib on Eliquis, essential hypertension, hyperlipidemia, anxiety disorder, CKD stage IIIa presented in the ED status post mechanical fall with left hip pain.  Patient states she is fully functional, lives alone, she was looking at the dog and she missed the step and fell on her left side and has developed left hip pain.  She was unable to stand up.  She was on the floor for 20 minutes.  She was brought in the ED found to have left femoral neck fracture.  She reports having significant pain in the left hip area describes as sharp, 9/10 on pain scale..  ED Course: Patient is hemodynamically stable except hypertension. Temp 97.2, HR 60, RR 20, BP 152/63, SpO2 95% on room air Labs include sodium 139, potassium 3.4, chloride 103, bicarb 24, glucose 100, BUN 17, creatinine 1.53, calcium 9.8, anion gap 12, WBC 5.3, hemoglobin 10.9, hematocrit 35.7, MCV 87.7, platelet 183, PT 16.1, INR 1.3, chest x-ray no acute infiltrate.  X-ray left hip Acute displaced left femoral neck fracture.    Review of Systems: Review of Systems  Constitutional: Negative.   HENT: Negative.    Eyes: Negative.   Respiratory: Negative.    Cardiovascular: Negative.   Gastrointestinal: Negative.   Genitourinary: Negative.   Musculoskeletal:        Left thigh pain, status post mechanical fall  Skin: Negative.   Psychiatric/Behavioral:  The patient is nervous/anxious.     Past Medical History:  Diagnosis Date   A-fib [facility])    Allergy    CAD (coronary artery disease)    sees Dr. Nicki Guadalajara  cardiac stents - 2000   CHF (congestive  heart failure) (HCC)    Colon polyps    Complication of anesthesia    rash/hives with "caines"   Dyspnea    02/12/18 " when my heart gets out of rhythm, it has not been out of rhythym- since I have been on Tikosyn (11/2017)   Dysrhythmia    afib fib   GERD (gastroesophageal reflux disease)    takes OTC- Omeprazole- prn   Heart murmur    History of kidney stones    History of stress test    show normal perfusion without scar or ischemia, post EF 68%   Hx of echocardiogram    show an EF 55%-60% range with grade 1 diastolic dysfunction, she had mitral anular calcification with mild MR, moderate LA dilation and mild pulmonary hypertension with a PA estimated pressure of 39mm   Hyperlipidemia    Hypertension    NSTEMI (non-ST elevated myocardial infarction) (HCC)    Osteoarthritis    Pacemaker    Pneumonia    hx of 2015    PONV (postoperative nausea and vomiting)    Sleep apnea     Past Surgical History:  Procedure Laterality Date   CARDIAC CATHETERIZATION     11/2017   cardiac stents  2000   COLONOSCOPY  01-05-14   per Dr. Dorena Cookey, clear, no repeats needed    COLONOSCOPY     CORONARY STENT PLACEMENT  2000   in LAD  CYSTOSCOPY WITH RETROGRADE PYELOGRAM, URETEROSCOPY AND STENT PLACEMENT Left 06/11/2022   Procedure: CYSTOSCOPY WITH RETROGRADE PYELOGRAM, LEFT STENT PLACEMENT;  Surgeon: Crista Elliot, MD;  Location: [facility]S;  Service: Urology;  Laterality: Left;   CYSTOSCOPY/URETEROSCOPY/HOLMIUM LASER/STENT PLACEMENT Left 06/25/2022   Procedure: CYSTOSCOPY LEFT URETEROSCOPY/HOLMIUM LASER/STENT PLACEMENT;  Surgeon: Bjorn Pippin, MD;  Location: [facility];  Service: Urology;  Laterality: Left;  1 HR FOR THIS CASE   DIRECT LARYNGOSCOPY WITH RADIAESSE INJECTION N/A 02/13/2018   Procedure: DIRECT LARYNGOSCOPY WITH RADIAESSE INJECTION;  Surgeon: Christia Reading, MD;  Location: [facility] OR;  Service: ENT;  Laterality: N/A;   EYE SURGERY Left    CATARACT REMOVAL   KNEE ARTHROSCOPY Left 01/06/2015    Procedure: LEFT KNEE ARTHROSCOPY, abrasion chondroplasty of the medial femerol condryl,medial and lateral menisectomy, microfracture , synovectomy of the suprpatellar pouch;  Surgeon: Ranee Gosselin, MD;  Location: [facility];  Service: Orthopedics;  Laterality: Left;   LEFT HEART CATH AND CORONARY ANGIOGRAPHY N/A 02/26/2019   Procedure: LEFT HEART CATH AND CORONARY ANGIOGRAPHY;  Surgeon: Lennette Bihari, MD;  Location: [facility] INVASIVE CV LAB;  Service: Cardiovascular;  Laterality: N/A;   LEFT HEART CATH AND CORONARY ANGIOGRAPHY N/A 05/06/2022   Procedure: LEFT HEART CATH AND CORONARY ANGIOGRAPHY;  Surgeon: Yvonne Kendall, MD;  Location: [facility] INVAS[facility] CV LAB;  Service: Cardiovascular;  Laterality: N/A;   MICROLARYNGOSCOPY W/VOCAL CORD INJECTION N/A 08/07/2018   Procedure: MICROLARYNGOSCOPY WITH VOCAL CORD INJECTION OF PROLARYN;  Surgeon: Christia Reading, MD;  Location: [facility] OR;  Service: ENT;  Laterality: N/A;  JET VENTILATION   PACEMAKER IMPLANT N/A 05/08/2022   Procedure: PACEMAKER IMPLANT;  Surgeon: Regan Lemming, MD;  Location: [facility] INVASIVE CV LAB;  Service: Cardiovascular;  Laterality: N/A;   REVERSE SHOULDER ARTHROPLASTY Right 03/23/2020   Procedure: REVERSE SHOULDER ARTHROPLASTY;  Surgeon: Yolonda Kida, MD;  Location: [facility] OR;  Service: Orthopedics;  Laterality: Right;   VAGINAL HYSTERECTOMY  1971     reports that she quit smoking about 26 years ago. Her smoking use included cigarettes. She started smoking about 66 years ago. She has never used smokeless tobacco. She reports that she does not drink alcohol and does not use drugs.  Allergies  Allergen Reactions   Lidocaine Anaphylaxis   Morphine Other (See Comments)    "Body shuts down"   Procaine Hcl Anaphylaxis, Rash and Other (See Comments)    "Anything with 'caine' in it "   Sulfonamide Derivatives Hives   Amlodipine Swelling   Norco [Hydrocodone-Acetaminophen] Nausea And Vomiting and Other (See Comments)    States does ok with IV form     Tramadol Nausea And Vomiting   Vytorin [Ezetimibe-Simvastatin] Other (See Comments)    Joint pain   Tape Itching and Other (See Comments)    Patient prefers either paper tape or Coban wrap    Family History  Problem Relation Age of Onset   Cancer Maternal Grandmother    Heart disease Maternal Grandfather    Family history reviewed and not pertinent.  Prior to Admission medications   Medication Sig Start Date End Date Taking? Authorizing Provider  apixaban (ELIQUIS) 2.5 MG TABS tablet Take 1 tablet by mouth twice daily 04/01/23  Yes Lennette Bihari, MD  acetaminophen (TYLENOL) 500 MG tablet Take 500 mg by mouth daily as needed for headache.    [provider]  aspirin 81 MG chewable tablet Chew 1 tablet (81 mg total) by mouth daily. 05/11/22   Leroy Sea, MD  Blood  Pressure Monitoring (BLOOD PRESSURE MONITOR AUTOMAT) DEVI 1 Units/min by Does not apply route daily. 04/17/23   Lennette Bihari, MD  Cholecalciferol (VITAMIN D3) 2000 units TABS Take 2,000 Units by mouth daily.    [provider]  dextromethorphan (DELSYM) 30 MG/5ML liquid Take 15 mg by mouth daily as needed for cough.    [provider]  docusate sodium (COLACE) 100 MG capsule Take 100 mg by mouth daily.    [provider]  furosemide (LASIX) 20 MG tablet Take 1 tablet by mouth once daily 05/16/23   [name], MD  hydrALAZINE (APRESOLINE) 100 MG tablet Take 1.5 tablets (150 mg total) by mouth 2 (two) times daily. 09/09/23   Ronney Asters, NP  ipratropium (ATROVENT) 0.06 % nasal spray Place 2 sprays into both nostrils 2 (two) times daily as needed (allergies). 04/28/18   [provider]  isosorbide mononitrate (IMDUR) 30 MG 24 hr tablet Take 1 tablet (30 mg total) by mouth daily. 12/20/22   Lennette Bihari, MD  JARDIANCE 10 MG TABS tablet Take 1 tablet by mouth once daily 08/29/23   [name], MD  ketoconazole (NIZORAL) 2 % cream APPLY 1 APPLICATION TOPICALLY TWO TIMES  DAILY AS NEEDED FOR IRRITATION Patient taking differently: Apply 1 Application topically 2 (two) times daily as needed for irritation. 10/03/22   [name], MD  loratadine (CLARITIN) 10 MG tablet Take 10 mg by mouth daily as needed for allergies.    [provider]  LORazepam (ATIVAN) 0.5 MG tablet TAKE 1 TABLET BY MOUTH EVERY 6 HOURS AS NEEDED FOR ANXIETY 05/16/23   [name], MD  metoprolol succinate (TOPROL-XL) 100 MG 24 hr tablet Take 1 tablet (100 mg total) by mouth every evening. Take with or immediately following a meal. 07/09/23   [name], MD  Multiple Vitamin (MULTIVITAMIN WITH MINERALS) TABS tablet Take 1 tablet by mouth daily.    [provider]  nitroGLYCERIN (NITROSTAT) 0.4 MG SL tablet DISSOLVE ONE TABLET UNDER THE TONGUE EVERY 5 MINUTES AS NEEDED FOR CHEST PAIN.  DO NOT EXCEED A TOTAL OF 3 DOSES IN 15 MINUTES Patient taking differently: Place 0.4 mg under the tongue every 5 (five) minutes as needed for chest pain. 03/27/23   Lennette Bihari, MD  pantoprazole (PROTONIX) 40 MG tablet Take 1 tablet (40 mg total) by mouth daily. 12/20/22 12/21/23  Lennette Bihari, MD  polyethylene glycol powder (GLYCOLAX/MIRALAX) 17 GM/SCOOP powder Take 17 g by mouth daily. Patient taking differently: Take 17 g by mouth daily as needed for mild constipation or moderate constipation. 05/10/22   Leroy Sea, MD  potassium chloride (KLOR-CON) 10 MEQ tablet Take 1 tablet (10 mEq total) by mouth daily. 08/14/22   [name], MD  Probiotic Product (PROBIOTIC GUMMIES PO) Take 2 tablets by mouth every morning.    [provider]  rosuvastatin (CRESTOR) 40 MG tablet Take 1 tablet by mouth once daily 08/22/23   Lennette Bihari, MD  triamcinolone (NASACORT) 55 MCG/ACT nasal inhaler Place 1 spray into both nostrils daily as needed (for allergies).  10/17/12   [name], MD    Physical Exam: Vitals:   09/26/23 1445 09/26/23 1500 09/26/23 1501 09/26/23 1511  BP:  130/77 (!) 142/63    Pulse: (!) 59 60 60   Resp:      Temp:    (!) 97.2 F (36.2 C)  TempSrc:    Temporal  SpO2: 95%  95%     Constitutional: NAD, calm, comfortable, not in any distress. Vitals:   09/26/23 1445 09/26/23 1500 09/26/23 1501 09/26/23 1511  BP: 130/77 (!) 142/63    Pulse: (!) 59 60 60   Resp:      Temp:    (!) 97.2 F (36.2 C)  TempSrc:    Temporal  SpO2: 95%  95%    Eyes: PERRL, lids and conjunctivae normal ENMT: Mucous membranes are moist. Posterior pharynx clear of any exudate or lesions. Neck: normal, supple, no masses, no thyromegaly Respiratory: clear to auscultation bilaterally, no wheezing, no crackles. Normal respiratory effort. No accessory muscle use.  Cardiovascular: S1-S2 heard, regular rate and rhythm, no murmurs / rubs / gallops. No extremity edema.  Abdomen: Soft, no tenderness, no masses palpated. No hepatosplenomegaly. Bowel sounds positive.  Musculoskeletal: Left hip tenderness, status post mechanical fall.  Left LE externally rotated. Skin: no rashes, lesions, ulcers. No induration Neurologic: CN 2-12 grossly intact. Sensation intact, DTR normal. Strength 5/5 in all 4.  Psychiatric: Normal judgment and insight. Alert and oriented x 3. Normal mood.     Labs on Admission: I have personally reviewed following labs and imaging studies  CBC: Recent Labs  Lab 09/26/23 1248  WBC 5.3  NEUTROABS 4.2  HGB 10.9*  HCT 35.7*  MCV 87.7  PLT 183   Basic Metabolic Panel: Recent Labs  Lab 09/26/23 1248  NA 139  K 3.4*  CL 103  CO2 24  GLUCOSE 100*  BUN 17  CREATININE 1.53*  CALCIUM 9.8   GFR: CrCl cannot be calculated (Unknown ideal weight.). Liver Function Tests: No results for input(s): "AST", "ALT", "ALKPHOS", "BILITOT", "PROT", "ALBUMIN" in the last 168 hours. No results for input(s): "LIPASE", "AMYLASE" in the last 168 hours. No results for input(s): "AMMONIA" in the last 168 hours. Coagulation Profile: Recent Labs  Lab  09/26/23 1248  INR 1.3*   Cardiac Enzymes: No results for input(s): "CKTOTAL", "CKMB", "CKMBINDEX", "TROPONINI" in the last 168 hours. BNP (last 3 results) No results for input(s): "PROBNP" in the last 8760 hours. HbA1C: No results for input(s): "HGBA1C" in the last 72 hours. CBG: Recent Labs  Lab 09/26/23 1245  GLUCAP 91   Lipid Profile: No results for input(s): "CHOL", "HDL", "LDLCALC", "TRIG", "CHOLHDL", "LDLDIRECT" in the last 72 hours. Thyroid Function Tests: No results for input(s): "TSH", "T4TOTAL", "FREET4", "T3FREE", "THYROIDAB" in the last 72 hours. Anemia Panel: No results for input(s): "VITAMINB12", "FOLATE", "FERRITIN", "TIBC", "IRON", "RETICCTPCT" in the last 72 hours. Urine analysis:    Component Value Date/Time   COLORURINE YELLOW 06/09/2022 1426   APPEARANCEUR CLOUDY (A) 06/09/2022 1426   LABSPEC 1.010 06/09/2022 1426   PHURINE 6.5 06/09/2022 1426   GLUCOSEU NEGATIVE 06/09/2022 1426   GLUCOSEU NEGATIVE 09/13/2021 1203   HGBUR LARGE (A) 06/09/2022 1426   HGBUR negative 10/17/2010 0859   BILIRUBINUR NEGATIVE 06/09/2022 1426   BILIRUBINUR neg 08/01/2021 1037   KETONESUR NEGATIVE 06/09/2022 1426   PROTEINUR NEGATIVE 06/09/2022 1426   UROBILINOGEN 0.2 09/13/2021 1203   NITRITE NEGATIVE 06/09/2022 1426   LEUKOCYTESUR TRACE (A) 06/09/2022 1426    Radiological Exams on Admission: DG Chest Portable 1 View  Result Date: 09/26/2023 CLINICAL DATA:  Left hip fracture.  Fall. EXAM: PORTABLE CHEST 1 VIEW COMPARISON:  Chest x-ray dated August 04, 2022. FINDINGS: Unchanged left chest wall pacemaker. Similar mild cardiomegaly. Chronic scarring at the left lung base. No focal consolidation, pleural effusion, or pneumothorax. No acute osseous abnormality. IMPRESSION: 1. No active  disease. Electronically Signed   By: Obie Dredge M.D.   On: 09/26/2023 14:01   DG Hip Unilat W or Wo Pelvis 2-3 Views Left  Result Date: 09/26/2023 CLINICAL DATA:  Left hip pain after  fall. EXAM: DG HIP (WITH OR WITHOUT PELVIS) 2-3V LEFT COMPARISON:  CT abdomen pelvis dated June 09, 2022. FINDINGS: Acute displaced fracture of the left femoral neck with significant varus angulation. No dislocation. No additional fracture. IMPRESSION: 1. Acute displaced left femoral neck fracture. Electronically Signed   By: Obie Dredge M.D.   On: 09/26/2023 14:00    EKG: Independently reviewed.  Atrial paced rhythm.  Assessment/Plan Principal Problem:   Closed left subtrochanteric femur fracture (HCC) Active Problems:   CAD (coronary artery disease/elevated troponin   CKD (chronic kidney disease), stage III (HCC)   PAF (paroxysmal atrial fibrillation) (HCC)   Essential hypertension   Hyperlipidemia   Gastroesophageal reflux disease   Osteoarthritis  Left femoral neck fracture status post mechanical fall: Patient tripped and fell on the left side while going downstairs and has developed left thigh pain. X-ray shows acute displaced left femoral neck fracture. Orthopedics consulted.  Patient is scheduled for ORIF tomorrow. N.p.o. midnight. Adequate pain control. Continue IV hydration. PT and OT evaluation postsurgery.  Essential hypertension: Continue hydralazine 150 mg daily Continue Toprol 100 mg daily Continue Imdur 30 mg daily  Hyperlipidemia: Continue Crestor 40 mg daily  CAD: Continue aspirin, Crestor, Imdur.  Paroxysmal A-fib: Heart rate is well-controlled. Continue metoprolol Hold Eliquis since patient is going to have surgery.  Anxiety disorder Continue lorazepam 0.5 mg q6hr prn  GERD Continue pantoprazole 40 mg daily.  CKD stage IIIA: Serum creatinine at baseline.  Permanent pacemaker: Stable.  Continue follow-up outpatient  DVT prophylaxis: Heparin , Hold Eliquis, Aspirin Code Status: Full code Family Communication: No family at bed side. Disposition Plan:     Status is: Inpatient Remains inpatient appropriate because: Admitted for Left hip  pain, Left femur fracture, orth consulted    Consults called: Ortho Admission status: Inpatient   Willeen Niece MD [facility]  If 7PM-7AM, please contact night-coverage [email or web address]   09/26/2023, 4:49 PM

## 2023-09-26 NOTE — Progress Notes (Signed)
Initial Nutrition Assessment  DOCUMENTATION CODES:   Not applicable  INTERVENTION:   -Recommend diet advancement to low sodium when able.  -Revisit oral supplements if meal intakes trend >75%.   NUTRITION DIAGNOSIS:   Increased nutrient needs related to post-op healing as evidenced by estimated needs.    GOAL:   Patient will meet greater than or equal to 90% of their needs    MONITOR:   PO intake, Weight trends, Labs, Supplement acceptance, Skin  REASON FOR ASSESSMENT:   Consult Assessment of nutrition requirement/status  ASSESSMENT: 82 y/o female presented to the ED after a mechanical fall at home.  Admitted with a left femoral neck fracture. PMH: CAD, CHF, A-fib, HTN, HLD, anxiety disorder, CKD3, dyspnea, GERD, kidney stones, pacemaker placement, pneumonia, NSTEMI, sleep apnea, coronary artery stent placement, smoking hx.  Patient is NPO for surgery, scheduled for ORIF tomorrow. She states that she follows a low salt diet at home. Denies change in diet, amount eating or appetite. Stated usual weight 153#. Denies chewing or swallowing difficulty. Encouraged patient to eat well to support healing postoperative. She does not care for Ensure.  Per EMR weight hx shows a weight of 8% from 03/13/23 of 77.6 kg and a 11% loss from 09/18/22  of 80.6 kg.   Medications reviewed and include colace, PPI, senna, 0.9% sodium chloride infusion @ 40 mL/hr.  Labs reviewed   Diet Order:   Diet Order             Diet NPO time specified Except for: Sips with Meds  Diet effective midnight                   EDUCATION NEEDS:   Not appropriate for education at this time  Skin:  Skin Assessment: Reviewed RN Assessment (no skin assessment recorded yet.)  Last BM:  09/25/23  Height:   Ht Readings from Last 1 Encounters:  09/09/23 5\' 7"  (1.702 m)    Weight:   Wt Readings from Last 1 Encounters:  09/09/23 71 kg    Ideal Body Weight:  63.6 kg  BMI:  There is no height  or weight on file to calculate BMI.  Estimated Nutritional Needs:   Kcal:  1600-1900 kcal/day  Protein:  76-83 gm/day  Fluid:  1600-1900 mL/day    Alvino Chapel, RDLD Clinical Dietitian See AMION for contact information

## 2023-09-26 NOTE — ED Provider Notes (Signed)
[facility]PARTMENT AT [facility] Provider Note   CSN: [id number] Arrival date & time: 09/26/23  1240     History  Chief Complaint  Patient presents with   Barbara [name] is a 82 y.o. female.  Pt is a 82 yo female with pmhx significant for hld, htn, arthritis, afib (on Eliquis), cad, chf, gerd, kidney stones, s/p pacemaker placement and sleep apnea.  Pt said she tripped in her driveway and landed on her left hip.  She did not hit her head.  Level 2 trauma was initially called; however, she did not hit her head, so it was downgraded after arrival.       Home Medications Prior to Admission medications   Medication Sig Start Date End Date Taking? Authorizing Provider  apixaban (ELIQUIS) 2.5 MG TABS tablet Take 1 tablet by mouth twice daily 04/01/23  Yes Lennette Bihari, MD  acetaminophen (TYLENOL) 500 MG tablet Take 500 mg by mouth daily as needed for headache.    [provider]  aspirin 81 MG chewable tablet Chew 1 tablet (81 mg total) by mouth daily. 05/11/22   Leroy Sea, MD  Blood Pressure Monitoring (BLOOD PRESSURE MONITOR AUTOMAT) DEVI 1 Units/min by Does not apply route daily. 04/17/23   Lennette Bihari, MD  Cholecalciferol (VITAMIN D3) 2000 units TABS Take 2,000 Units by mouth daily.    [provider]  dextromethorphan (DELSYM) 30 MG/5ML liquid Take 15 mg by mouth daily as needed for cough.    [provider]  docusate sodium (COLACE) 100 MG capsule Take 100 mg by mouth daily.    [provider]  furosemide (LASIX) 20 MG tablet Take 1 tablet by mouth once daily 05/16/23   [name], MD  hydrALAZINE (APRESOLINE) 100 MG tablet Take 1.5 tablets (150 mg total) by mouth 2 (two) times daily. 09/09/23   Ronney Asters, NP  ipratropium (ATROVENT) 0.06 % nasal spray Place 2 sprays into both nostrils 2 (two) times daily as needed (allergies). 04/28/18   [provider]  isosorbide mononitrate  (IMDUR) 30 MG 24 hr tablet Take 1 tablet (30 mg total) by mouth daily. 12/20/22   Lennette Bihari, MD  JARDIANCE 10 MG TABS tablet Take 1 tablet by mouth once daily 08/29/23   [name], MD  ketoconazole (NIZORAL) 2 % cream APPLY 1 APPLICATION TOPICALLY TWO TIMES DAILY AS NEEDED FOR IRRITATION Patient taking differently: Apply 1 Application topically 2 (two) times daily as needed for irritation. 10/03/22   [name], MD  loratadine (CLARITIN) 10 MG tablet Take 10 mg by mouth daily as needed for allergies.    [provider]  LORazepam (ATIVAN) 0.5 MG tablet TAKE 1 TABLET BY MOUTH EVERY 6 HOURS AS NEEDED FOR ANXIETY 05/16/23   [name], MD  metoprolol succinate (TOPROL-XL) 100 MG 24 hr tablet Take 1 tablet (100 mg total) by mouth every evening. Take with or immediately following a meal. 07/09/23   [name], MD  Multiple Vitamin (MULTIVITAMIN WITH MINERALS) TABS tablet Take 1 tablet by mouth daily.    [provider]  nitroGLYCERIN (NITROSTAT) 0.4 MG SL tablet DISSOLVE ONE TABLET UNDER THE TONGUE EVERY 5 MINUTES AS NEEDED FOR CHEST PAIN.  DO NOT EXCEED A TOTAL OF 3 DOSES IN 15 MINUTES Patient taking differently: Place 0.4 mg under the tongue every 5 (five) minutes as needed for chest pain. 03/27/23   Nicki Guadalajara  A, MD  pantoprazole (PROTONIX) 40 MG tablet Take 1 tablet (40 mg total) by mouth daily. 12/20/22 12/21/23  Lennette Bihari, MD  polyethylene glycol powder (GLYCOLAX/MIRALAX) 17 GM/SCOOP powder Take 17 g by mouth daily. Patient taking differently: Take 17 g by mouth daily as needed for mild constipation or moderate constipation. 05/10/22   Leroy Sea, MD  potassium chloride (KLOR-CON) 10 MEQ tablet Take 1 tablet (10 mEq total) by mouth daily. 08/14/22   [name], MD  Probiotic Product (PROBIOTIC GUMMIES PO) Take 2 tablets by mouth every morning.    [provider]  rosuvastatin (CRESTOR) 40 MG tablet Take 1 tablet by mouth once daily 08/22/23    Lennette Bihari, MD  triamcinolone (NASACORT) 55 MCG/ACT nasal inhaler Place 1 spray into both nostrils daily as needed (for allergies).  10/17/12   [name], MD      Allergies    Lidocaine, Morphine, Procaine hcl, Sulfonamide derivatives, Amlodipine, Norco [hydrocodone-acetaminophen], Tramadol, Vytorin [ezetimibe-simvastatin], and Tape    Review of Systems   Review of Systems  Musculoskeletal:        Left hip pain  All other systems reviewed and are negative.   Physical Exam Updated Vital Signs BP (!) 143/62   Pulse 60   Resp 20   SpO2 94%  Physical Exam Vitals and nursing note reviewed.  Constitutional:      Appearance: Normal appearance.  HENT:     Head: Normocephalic and atraumatic.     Right Ear: External ear normal.     Left Ear: External ear normal.     Nose: Nose normal.     Mouth/Throat:     Mouth: Mucous membranes are moist.     Pharynx: Oropharynx is clear.  Eyes:     Extraocular Movements: Extraocular movements intact.     Conjunctiva/sclera: Conjunctivae normal.     Pupils: Pupils are equal, round, and reactive to light.  Cardiovascular:     Rate and Rhythm: Normal rate and regular rhythm.     Pulses: Normal pulses.     Heart sounds: Normal heart sounds.  Pulmonary:     Effort: Pulmonary effort is normal.     Breath sounds: Normal breath sounds.  Abdominal:     General: Abdomen is flat. Bowel sounds are normal.     Palpations: Abdomen is soft.  Musculoskeletal:     Cervical back: Normal range of motion and neck supple.  Skin:    General: Skin is warm.     Capillary Refill: Capillary refill takes less than 2 seconds.  Neurological:     General: No focal deficit present.     Mental Status: She is alert and oriented to person, place, and time.  Psychiatric:        Mood and Affect: Mood normal.        Behavior: Behavior normal.     ED Results / Procedures / Treatments   Labs (all labs ordered are listed, but only abnormal results are  displayed) Labs Reviewed  BASIC METABOLIC PANEL - Abnormal; Notable for the following components:      Result Value   Potassium 3.4 (*)    Glucose, Bld 100 (*)    Creatinine, Ser 1.53 (*)    GFR, Estimated 33 (*)    All other components within normal limits  CBC WITH DIFFERENTIAL/PLATELET - Abnormal; Notable for the following components:   Hemoglobin 10.9 (*)    HCT 35.7 (*)    RDW 16.2 (*)  Lymphs Abs 0.5 (*)    All other components within normal limits  PROTIME-INR - Abnormal; Notable for the following components:   Prothrombin Time 16.1 (*)    INR 1.3 (*)    All other components within normal limits  URINALYSIS, ROUTINE W REFLEX MICROSCOPIC  CBG MONITORING, ED  TYPE AND SCREEN  ABO/RH    EKG EKG Interpretation Date/Time:  Friday September 26 2023 14:13:58 EST Ventricular Rate:  60 PR Interval:  107 QRS Duration:  90 QT Interval:  494 QTC Calculation: 494 R Axis:   68  Text Interpretation: Atrial-paced rhythm Probable LVH with secondary repol abnrm Borderline prolonged QT interval No significant change since last tracing Confirmed by Jacalyn Lefevre [phone]) on 09/26/2023 2:24:58 PM  Radiology DG Chest Portable 1 View  Result Date: 09/26/2023 CLINICAL DATA:  Left hip fracture.  Fall. EXAM: PORTABLE CHEST 1 VIEW COMPARISON:  Chest x-ray dated August 04, 2022. FINDINGS: Unchanged left chest wall pacemaker. Similar mild cardiomegaly. Chronic scarring at the left lung base. No focal consolidation, pleural effusion, or pneumothorax. No acute osseous abnormality. IMPRESSION: 1. No active disease. Electronically Signed   By: Obie Dredge M.D.   On: 09/26/2023 14:01   DG Hip Unilat W or Wo Pelvis 2-3 Views Left  Result Date: 09/26/2023 CLINICAL DATA:  Left hip pain after fall. EXAM: DG HIP (WITH OR WITHOUT PELVIS) 2-3V LEFT COMPARISON:  CT abdomen pelvis dated June 09, 2022. FINDINGS: Acute displaced fracture of the left femoral neck with significant varus angulation. No  dislocation. No additional fracture. IMPRESSION: 1. Acute displaced left femoral neck fracture. Electronically Signed   By: Obie Dredge M.D.   On: 09/26/2023 14:00    Procedures Procedures    Medications Ordered in ED Medications  fentaNYL (SUBLIMAZE) injection 50 mcg (50 mcg Intravenous Given 09/26/23 1317)  ondansetron (ZOFRAN) injection 4 mg (4 mg Intravenous Given 09/26/23 1317)    ED Course/ Medical Decision Making/ A&P                                 Medical Decision Making Amount and/or Complexity of Data Reviewed Labs: ordered. Radiology: ordered.  Risk Prescription drug management. Decision regarding hospitalization.   This patient presents to the ED for concern of fall, this involves an extensive number of treatment options, and is a complaint that carries with it a high risk of complications and morbidity.  The differential diagnosis includes multiple trauma   Co morbidities that complicate the patient evaluation  hld, htn, arthritis, afib (on Eliquis), cad, chf, gerd, kidney stones, s/p pacemaker placement and sleep apnea   Additional history obtained:  Additional history obtained from epic chart review External records from outside source obtained and reviewed including EMS report   Lab Tests:  I Ordered, and personally interpreted labs.  The pertinent results include:  cbc with hgb 10.9 (stable); bmp with cr 1.53 (chronic)   Imaging Studies ordered:  I ordered imaging studies including cxr, left hip  I independently visualized and interpreted imaging which showed  CXR: . No active disease.  L hip: Acute displaced left femoral neck fracture.  I agree with the radiologist interpretation   Cardiac Monitoring:  The patient was maintained on a cardiac monitor.  I personally viewed and interpreted the cardiac monitored which showed an underlying rhythm of: nsr   Medicines ordered and prescription drug management:  I ordered medication  including fentanyl/zofran  for sx  Reevaluation of the patient after these medicines showed that the patient improved I have reviewed the patients home medicines and have made adjustments as needed   Test Considered:  ct   Critical Interventions:  Pain control   Consultations Obtained:  I requested consultation with the orthopedist,  and discussed lab and imaging findings as well as pertinent plan - Pt requested [facility], however, I spoke with them and they said no one can take care of her and she would have to go to unassigned.  So, I spoke with Charma Igo (ortho PA) who will see pt Pt d/w Dr. Idelle Leech (triad) for admission   Problem List / ED Course:  Left femoral neck fx:  pt will need admission for repair.  She is on the OR schedule for tomorrow with Dr. Eulah Pont.   Reevaluation:  After the interventions noted above, I reevaluated the patient and found that they have :improved   Social Determinants of Health:  Lives at home   Dispostion:  After consideration of the diagnostic results and the patients response to treatment, I feel that the patent would benefit from admission.    CRITICAL CARE Performed by: Jacalyn Lefevre   Total critical care time: 30 minutes  Critical care time was exclusive of separately billable procedures and treating other patients.  Critical care was necessary to treat or prevent imminent or life-threatening deterioration.  Critical care was time spent personally by me on the following activities: development of treatment plan with patient and/or surrogate as well as nursing, discussions with consultants, evaluation of patient's response to treatment, examination of patient, obtaining history from patient or surrogate, ordering and performing treatments and interventions, ordering and review of laboratory studies, ordering and review of radiographic studies, pulse oximetry and re-evaluation of patient's condition.         Final  Clinical Impression(s) / ED Diagnoses Final diagnoses:  Closed fracture of neck of left femur, initial encounter (HCC)  On apixaban therapy    Rx / DC Orders ED Discharge Orders     None         Jacalyn Lefevre, MD 09/26/23 1505

## 2023-09-26 NOTE — Progress Notes (Signed)
Orthopedic Tech Progress Note Patient Details:  Barbara [name] 07-02-1941 [id number]  Level 2 Trauma visit. Patient ID: Barbara [name], female   DOB: 06-05-1941, 82 y.o.   MRN: [id number]  Sherilyn Banker 09/26/2023, 1:28 PM

## 2023-09-26 NOTE — ED Triage Notes (Signed)
Pt BIB [facility] d/t fall at home d/t slipping on steps. Pt denies hitting head, is on Eliquis. Does have Lt hip pain & rotation. VSS, no PIV, CBG 129.

## 2023-09-26 NOTE — Consult Note (Signed)
Reason for Consult:Left hip fx Referring Physician: Jacalyn Lefevre Time called: 1419 Time at bedside: 1430   Barbara [name] is an 82 y.o. female.  HPI: Asteria was coming down some steps and missed one and fell. She had immediate left hip pain and could not get up. She was brought to the ED where x-rays showed a left hip fx and orthopedic surgery was consulted. She lives at home alone and uses a cane to ambulate.  Past Medical History:  Diagnosis Date   A-fib [facility])    Allergy    CAD (coronary artery disease)    sees Dr. Nicki Guadalajara  cardiac stents - 2000   CHF (congestive heart failure) (HCC)    Colon polyps    Complication of anesthesia    rash/hives with "caines"   Dyspnea    02/12/18 " when my heart gets out of rhythm, it has not been out of rhythym- since I have been on Tikosyn (11/2017)   Dysrhythmia    afib fib   GERD (gastroesophageal reflux disease)    takes OTC- Omeprazole- prn   Heart murmur    History of kidney stones    History of stress test    show normal perfusion without scar or ischemia, post EF 68%   Hx of echocardiogram    show an EF 55%-60% range with grade 1 diastolic dysfunction, she had mitral anular calcification with mild MR, moderate LA dilation and mild pulmonary hypertension with a PA estimated pressure of 39mm   Hyperlipidemia    Hypertension    NSTEMI (non-ST elevated myocardial infarction) (HCC)    Osteoarthritis    Pacemaker    Pneumonia    hx of 2015    PONV (postoperative nausea and vomiting)    Sleep apnea     Past Surgical History:  Procedure Laterality Date   CARDIAC CATHETERIZATION     11/2017   cardiac stents  2000   COLONOSCOPY  01-05-14   per Dr. Dorena Cookey, clear, no repeats needed    COLONOSCOPY     CORONARY STENT PLACEMENT  2000   in LAD   CYSTOSCOPY WITH RETROGRADE PYELOGRAM, URETEROSCOPY AND STENT PLACEMENT Left 06/11/2022   Procedure: CYSTOSCOPY WITH RETROGRADE PYELOGRAM, LEFT STENT PLACEMENT;  Surgeon: Crista Elliot, MD;  Location: [facility];  Service: Urology;  Laterality: Left;   CYSTOSCOPY/URETEROSCOPY/HOLMIUM LASER/STENT PLACEMENT Left 06/25/2022   Procedure: CYSTOSCOPY LEFT URETEROSCOPY/HOLMIUM LASER/STENT PLACEMENT;  Surgeon: Bjorn Pippin, MD;  Location: [facility];  Service: Urology;  Laterality: Left;  1 HR FOR THIS CASE   DIRECT LARYNGOSCOPY WITH RADIAESSE INJECTION N/A 02/13/2018   Procedure: DIRECT LARYNGOSCOPY WITH RADIAESSE INJECTION;  Surgeon: Christia Reading, MD;  Location: [facility] OR;  Service: ENT;  Laterality: N/A;   EYE SURGERY Left    CATARACT REMOVAL   KNEE ARTHROSCOPY Left 01/06/2015   Procedure: LEFT KNEE ARTHROSCOPY, abrasion chondroplasty of the medial femerol condryl,medial and lateral menisectomy, microfracture , synovectomy of the suprpatellar pouch;  Surgeon: Ranee Gosselin, MD;  Location: [facility];  Service: Orthopedics;  Laterality: Left;   LEFT HEART CATH AND CORONARY ANGIOGRAPHY N/A 02/26/2019   Procedure: LEFT HEART CATH AND CORONARY ANGIOGRAPHY;  Surgeon: Lennette Bihari, MD;  [facility];  Service: Cardiovascular;  Laterality: N/A;   LEFT HEART CATH AND CORONARY ANGIOGRAPHY N/A 05/06/2022   Procedure: LEFT HEART CATH AND CORONARY ANGIOGRAPHY;  Surgeon: Yvonne Kendall, MD;  [facility];  Service: Cardiovascular;  Laterality: N/A;  MICROLARYNGOSCOPY W/VOCAL CORD INJECTION N/A 08/07/2018   Procedure: MICROLARYNGOSCOPY WITH VOCAL CORD INJECTION OF PROLARYN;  Surgeon: Christia Reading, MD;  Location: [facility] OR;  Service: ENT;  Laterality: N/A;  JET VENTILATION   PACEMAKER IMPLANT N/A 05/08/2022   Procedure: PACEMAKER IMPLANT;  Surgeon: Regan Lemming, MD;  [facility];  Service: Cardiovascular;  Laterality: N/A;   REVERSE SHOULDER ARTHROPLASTY Right 03/23/2020   Procedure: REVERSE SHOULDER ARTHROPLASTY;  Surgeon: Yolonda Kida, MD;  Location: [facility] OR;  Service: Orthopedics;  Laterality: Right;   VAGINAL HYSTERECTOMY  1971    Family  History  Problem Relation Age of Onset   Cancer Maternal Grandmother    Heart disease Maternal Grandfather     Social History:  reports that she quit smoking about 26 years ago. Her smoking use included cigarettes. She started smoking about 66 years ago. She has never used smokeless tobacco. She reports that she does not drink alcohol and does not use drugs.  Allergies:  Allergies  Allergen Reactions   Lidocaine Anaphylaxis   Morphine Other (See Comments)    "Body shuts down"   Procaine Hcl Anaphylaxis, Rash and Other (See Comments)    "Anything with 'caine' in it "   Sulfonamide Derivatives Hives   Amlodipine Swelling   Norco [Hydrocodone-Acetaminophen] Nausea And Vomiting and Other (See Comments)    States does ok with IV form    Tramadol Nausea And Vomiting   Vytorin [Ezetimibe-Simvastatin] Other (See Comments)    Joint pain   Tape Itching and Other (See Comments)    Patient prefers either paper tape or Coban wrap    Medications: I have reviewed the patient's current medications.  Results for orders placed or performed during the hospital encounter of 09/26/23 (from the past 48 hour(s))  CBG monitoring, ED     Status: None   Collection Time: 09/26/23 12:45 PM  Result Value Ref Range   Glucose-Capillary 91 70 - 99 mg/dL    Comment: Glucose reference range applies only to samples taken after fasting for at least 8 hours.  Basic metabolic panel     Status: Abnormal   Collection Time: 09/26/23 12:48 PM  Result Value Ref Range   Sodium 139 135 - 145 mmol/L   Potassium 3.4 (L) 3.5 - 5.1 mmol/L   Chloride 103 98 - 111 mmol/L   CO2 24 22 - 32 mmol/L   Glucose, Bld 100 (H) 70 - 99 mg/dL    Comment: Glucose reference range applies only to samples taken after fasting for at least 8 hours.   BUN 17 8 - 23 mg/dL   Creatinine, Ser 1.61 (H) 0.44 - 1.00 mg/dL   Calcium 9.8 8.9 - 09.6 mg/dL   GFR, Estimated 33 (L) >60 mL/min    Comment: (NOTE) Calculated using the CKD-EPI  Creatinine Equation (2021)    Anion gap 12 5 - 15    Comment: Performed at [facility] Lab, [address] [place], [place] [address]  CBC with Differential     Status: Abnormal   Collection Time: 09/26/23 12:48 PM  Result Value Ref Range   WBC 5.3 4.0 - 10.5 K/uL   RBC 4.07 3.87 - 5.11 MIL/uL   Hemoglobin 10.9 (L) 12.0 - 15.0 g/dL   HCT 98.1 (L) 19.1 - 47.8 %   MCV 87.7 80.0 - 100.0 fL   MCH 26.8 26.0 - 34.0 pg   MCHC 30.5 30.0 - 36.0 g/dL   RDW 29.5 (H) 62.1 -  15.5 %   Platelets 183 150 - 400 K/uL   nRBC 0.0 0.0 - 0.2 %   Neutrophils Relative % 80 %   Neutro Abs 4.2 1.7 - 7.7 K/uL   Lymphocytes Relative 9 %   Lymphs Abs 0.5 (L) 0.7 - 4.0 K/uL   Monocytes Relative 8 %   Monocytes Absolute 0.4 0.1 - 1.0 K/uL   Eosinophils Relative 2 %   Eosinophils Absolute 0.1 0.0 - 0.5 K/uL   Basophils Relative 0 %   Basophils Absolute 0.0 0.0 - 0.1 K/uL   Immature Granulocytes 1 %   Abs Immature Granulocytes 0.03 0.00 - 0.07 K/uL    Comment: Performed at [facility], [address] [place], [place] [address]  Protime-INR     Status: Abnormal   Collection Time: 09/26/23 12:48 PM  Result Value Ref Range   Prothrombin Time 16.1 (H) 11.4 - 15.2 seconds   INR 1.3 (H) 0.8 - 1.2    Comment: (NOTE) INR goal varies based on device and disease states. Performed at [facility], [address], [place], [place] [address]   Type and screen [facility]     Status: None   Collection Time: 09/26/23  1:00 PM  Result Value Ref Range   ABO/RH(D) B POS    Antibody Screen NEG    Sample Expiration      09/29/2023,2359 Performed at [facility], [address], [place], [place] [address]   ABO/Rh     Status: None   Collection Time: 09/26/23  1:06 PM  Result Value Ref Range   ABO/RH(D)      B POS Performed at [facility], [address] [place], [place] [address]     DG Chest Portable 1 View  Result Date: 09/26/2023 CLINICAL DATA:  Left hip  fracture.  Fall. EXAM: PORTABLE CHEST 1 VIEW COMPARISON:  Chest x-ray dated August 04, 2022. FINDINGS: Unchanged left chest wall pacemaker. Similar mild cardiomegaly. Chronic scarring at the left lung base. No focal consolidation, pleural effusion, or pneumothorax. No acute osseous abnormality. IMPRESSION: 1. No active disease. Electronically Signed   By: Obie Dredge M.D.   On: 09/26/2023 14:01   DG Hip Unilat W or Wo Pelvis 2-3 Views Left  Result Date: 09/26/2023 CLINICAL DATA:  Left hip pain after fall. EXAM: DG HIP (WITH OR WITHOUT PELVIS) 2-3V LEFT COMPARISON:  CT abdomen pelvis dated June 09, 2022. FINDINGS: Acute displaced fracture of the left femoral neck with significant varus angulation. No dislocation. No additional fracture. IMPRESSION: 1. Acute displaced left femoral neck fracture. Electronically Signed   By: Obie Dredge M.D.   On: 09/26/2023 14:00    Review of Systems  HENT:  Negative for ear discharge, ear pain, hearing loss and tinnitus.   Eyes:  Negative for photophobia and pain.  Respiratory:  Negative for cough and shortness of breath.   Cardiovascular:  Negative for chest pain.  Gastrointestinal:  Negative for abdominal pain, nausea and vomiting.  Genitourinary:  Negative for dysuria, flank pain, frequency and urgency.  Musculoskeletal:  Positive for arthralgias (Left hip). Negative for back pain, myalgias and neck pain.  Neurological:  Negative for dizziness and headaches.  Hematological:  Does not bruise/bleed easily.  Psychiatric/Behavioral:  The patient is not nervous/anxious.    Blood pressure (!) 143/62, pulse 60, resp. rate 20, SpO2 94%. Physical Exam Constitutional:      General: She is not in acute distress.  Appearance: She is well-developed. She is not diaphoretic.  HENT:     Head: Normocephalic and atraumatic.  Eyes:     General: No scleral icterus.       Right eye: No discharge.        Left eye: No discharge.     Conjunctiva/sclera:  Conjunctivae normal.  Cardiovascular:     Rate and Rhythm: Normal rate and regular rhythm.  Pulmonary:     Effort: Pulmonary effort is normal. No respiratory distress.  Musculoskeletal:     Cervical back: Normal range of motion.     Comments: LLE No traumatic wounds, ecchymosis, or rash  Mod TTP hip  No knee or ankle effusion  Knee stable to varus/ valgus and anterior/posterior stress  Sens DPN, SPN, TN intact  Motor EHL, ext, flex, evers 5/5  DP 2+, PT 2+, No significant edema  Skin:    General: Skin is warm and dry.  Neurological:     Mental Status: She is alert.  Psychiatric:        Mood and Affect: Mood normal.        Behavior: Behavior normal.     Assessment/Plan: Left hip fx -- Plan hip hemi tomorrow with Dr. Eulah Pont. Please keep NPO after MN.    Freeman Caldron, PA-C Orthopedic Surgery [phone] 09/26/2023, 2:43 PM

## 2023-09-26 NOTE — Progress Notes (Signed)
   09/26/23 1252  Spiritual Encounters  Type of Visit Attempt (pt unavailable)  Conversation partners present during encounter Nurse  Reason for visit Trauma  OnCall Visit No   Responded to level 2 trauma downgraded to non-trauma

## 2023-09-26 NOTE — H&P (View-Only) (Signed)
Reason for Consult:Left hip fx Referring Physician: Jacalyn Lefevre Time called: 1419 Time at bedside: 1430   Barbara [name] is an 82 y.o. female.  HPI: Barbara [name] was coming down some steps and missed one and fell. She had immediate left hip pain and could not get up. She was brought to the ED where x-rays showed a left hip fx and orthopedic surgery was consulted. She lives at home alone and uses a cane to ambulate.  Past Medical History:  Diagnosis Date   A-fib [facility])    Allergy    CAD (coronary artery disease)    sees Dr. Nicki Guadalajara  cardiac stents - 2000   CHF (congestive heart failure) (HCC)    Colon polyps    Complication of anesthesia    rash/hives with "caines"   Dyspnea    02/12/18 " when my heart gets out of rhythm, it has not been out of rhythym- since I have been on Tikosyn (11/2017)   Dysrhythmia    afib fib   GERD (gastroesophageal reflux disease)    takes OTC- Omeprazole- prn   Heart murmur    History of kidney stones    History of stress test    show normal perfusion without scar or ischemia, post EF 68%   Hx of echocardiogram    show an EF 55%-60% range with grade 1 diastolic dysfunction, she had mitral anular calcification with mild MR, moderate LA dilation and mild pulmonary hypertension with a PA estimated pressure of 39mm   Hyperlipidemia    Hypertension    NSTEMI (non-ST elevated myocardial infarction) (HCC)    Osteoarthritis    Pacemaker    Pneumonia    hx of 2015    PONV (postoperative nausea and vomiting)    Sleep apnea     Past Surgical History:  Procedure Laterality Date   CARDIAC CATHETERIZATION     11/2017   cardiac stents  2000   COLONOSCOPY  01-05-14   per Dr. Dorena Cookey, clear, no repeats needed    COLONOSCOPY     CORONARY STENT PLACEMENT  2000   in LAD   CYSTOSCOPY WITH RETROGRADE PYELOGRAM, URETEROSCOPY AND STENT PLACEMENT Left 06/11/2022   Procedure: CYSTOSCOPY WITH RETROGRADE PYELOGRAM, LEFT STENT PLACEMENT;  Surgeon: Crista Elliot, MD;  [facility];  Service: Urology;  Laterality: Left;   CYSTOSCOPY/URETEROSCOPY/HOLMIUM LASER/STENT PLACEMENT Left 06/25/2022   Procedure: CYSTOSCOPY LEFT URETEROSCOPY/HOLMIUM LASER/STENT PLACEMENT;  Surgeon: Bjorn Pippin, MD;  Location: [facility];  Service: Urology;  Laterality: Left;  1 HR FOR THIS CASE   DIRECT LARYNGOSCOPY WITH RADIAESSE INJECTION N/A 02/13/2018   Procedure: DIRECT LARYNGOSCOPY WITH RADIAESSE INJECTION;  Surgeon: Christia Reading, MD;  Location: [facility] OR;  Service: ENT;  Laterality: N/A;   EYE SURGERY Left    CATARACT REMOVAL   KNEE ARTHROSCOPY Left 01/06/2015   Procedure: LEFT KNEE ARTHROSCOPY, abrasion chondroplasty of the medial femerol condryl,medial and lateral menisectomy, microfracture , synovectomy of the suprpatellar pouch;  Surgeon: Ranee Gosselin, MD;  Location: [facility];  Service: Orthopedics;  Laterality: Left;   LEFT HEART CATH AND CORONARY ANGIOGRAPHY N/A 02/26/2019   Procedure: LEFT HEART CATH AND CORONARY ANGIOGRAPHY;  Surgeon: Lennette Bihari, MD;  [facility];  Service: Cardiovascular;  Laterality: N/A;   LEFT HEART CATH AND CORONARY ANGIOGRAPHY N/A 05/06/2022   Procedure: LEFT HEART CATH AND CORONARY ANGIOGRAPHY;  Surgeon: Yvonne Kendall, MD;  [facility];  Service: Cardiovascular;  Laterality: N/A;  MICROLARYNGOSCOPY W/VOCAL CORD INJECTION N/A 08/07/2018   Procedure: MICROLARYNGOSCOPY WITH VOCAL CORD INJECTION OF PROLARYN;  Surgeon: Christia Reading, MD;  Location: [facility];  Service: ENT;  Laterality: N/A;  JET VENTILATION   PACEMAKER IMPLANT N/A 05/08/2022   Procedure: PACEMAKER IMPLANT;  Surgeon: Regan Lemming, MD;  [facility];  Service: Cardiovascular;  Laterality: N/A;   REVERSE SHOULDER ARTHROPLASTY Right 03/23/2020   Procedure: REVERSE SHOULDER ARTHROPLASTY;  Surgeon: Yolonda Kida, MD;  Location: [facility] OR;  Service: Orthopedics;  Laterality: Right;   VAGINAL HYSTERECTOMY  1971    Family  History  Problem Relation Age of Onset   Cancer Maternal Grandmother    Heart disease Maternal Grandfather     Social History:  reports that she quit smoking about 26 years ago. Her smoking use included cigarettes. She started smoking about 66 years ago. She has never used smokeless tobacco. She reports that she does not drink alcohol and does not use drugs.  Allergies:  Allergies  Allergen Reactions   Lidocaine Anaphylaxis   Morphine Other (See Comments)    "Body shuts down"   Procaine Hcl Anaphylaxis, Rash and Other (See Comments)    "Anything with 'caine' in it "   Sulfonamide Derivatives Hives   Amlodipine Swelling   Norco [Hydrocodone-Acetaminophen] Nausea And Vomiting and Other (See Comments)    States does ok with IV form    Tramadol Nausea And Vomiting   Vytorin [Ezetimibe-Simvastatin] Other (See Comments)    Joint pain   Tape Itching and Other (See Comments)    Patient prefers either paper tape or Coban wrap    Medications: I have reviewed the patient's current medications.  Results for orders placed or performed during the hospital encounter of 09/26/23 (from the past 48 hour(s))  CBG monitoring, ED     Status: None   Collection Time: 09/26/23 12:45 PM  Result Value Ref Range   Glucose-Capillary 91 70 - 99 mg/dL    Comment: Glucose reference range applies only to samples taken after fasting for at least 8 hours.  Basic metabolic panel     Status: Abnormal   Collection Time: 09/26/23 12:48 PM  Result Value Ref Range   Sodium 139 135 - 145 mmol/L   Potassium 3.4 (L) 3.5 - 5.1 mmol/L   Chloride 103 98 - 111 mmol/L   CO2 24 22 - 32 mmol/L   Glucose, Bld 100 (H) 70 - 99 mg/dL    Comment: Glucose reference range applies only to samples taken after fasting for at least 8 hours.   BUN 17 8 - 23 mg/dL   Creatinine, Ser 1.61 (H) 0.44 - 1.00 mg/dL   Calcium 9.8 8.9 - 09.6 mg/dL   GFR, Estimated 33 (L) >60 mL/min    Comment: (NOTE) Calculated using the CKD-EPI  Creatinine Equation (2021)    Anion gap 12 5 - 15    Comment: Performed at [facility], [address] [place], [place] [address]  CBC with Differential     Status: Abnormal   Collection Time: 09/26/23 12:48 PM  Result Value Ref Range   WBC 5.3 4.0 - 10.5 K/uL   RBC 4.07 3.87 - 5.11 MIL/uL   Hemoglobin 10.9 (L) 12.0 - 15.0 g/dL   HCT 98.1 (L) 19.1 - 47.8 %   MCV 87.7 80.0 - 100.0 fL   MCH 26.8 26.0 - 34.0 pg   MCHC 30.5 30.0 - 36.0 g/dL   RDW 29.5 (H) 62.1 -  15.5 %   Platelets 183 150 - 400 K/uL   nRBC 0.0 0.0 - 0.2 %   Neutrophils Relative % 80 %   Neutro Abs 4.2 1.7 - 7.7 K/uL   Lymphocytes Relative 9 %   Lymphs Abs 0.5 (L) 0.7 - 4.0 K/uL   Monocytes Relative 8 %   Monocytes Absolute 0.4 0.1 - 1.0 K/uL   Eosinophils Relative 2 %   Eosinophils Absolute 0.1 0.0 - 0.5 K/uL   Basophils Relative 0 %   Basophils Absolute 0.0 0.0 - 0.1 K/uL   Immature Granulocytes 1 %   Abs Immature Granulocytes 0.03 0.00 - 0.07 K/uL    Comment: Performed at [facility], [address], [place], [place] [address]  Protime-INR     Status: Abnormal   Collection Time: 09/26/23 12:48 PM  Result Value Ref Range   Prothrombin Time 16.1 (H) 11.4 - 15.2 seconds   INR 1.3 (H) 0.8 - 1.2    Comment: (NOTE) INR goal varies based on device and disease states. Performed at [facility], [address] [place], [place] [address]   Type and screen [facility]     Status: None   Collection Time: 09/26/23  1:00 PM  Result Value Ref Range   ABO/RH(D) B POS    Antibody Screen NEG    Sample Expiration      09/29/2023,2359 Performed at [facility], [address], [place], [place] [address]   ABO/Rh     Status: None   Collection Time: 09/26/23  1:06 PM  Result Value Ref Range   ABO/RH(D)      B POS Performed at [facility], [address], [place], [place] [address]     DG Chest Portable 1 View  Result Date: 09/26/2023 CLINICAL DATA:  Left hip  fracture.  Fall. EXAM: PORTABLE CHEST 1 VIEW COMPARISON:  Chest x-ray dated August 04, 2022. FINDINGS: Unchanged left chest wall pacemaker. Similar mild cardiomegaly. Chronic scarring at the left lung base. No focal consolidation, pleural effusion, or pneumothorax. No acute osseous abnormality. IMPRESSION: 1. No active disease. Electronically Signed   By: Obie Dredge M.D.   On: 09/26/2023 14:01   DG Hip Unilat W or Wo Pelvis 2-3 Views Left  Result Date: 09/26/2023 CLINICAL DATA:  Left hip pain after fall. EXAM: DG HIP (WITH OR WITHOUT PELVIS) 2-3V LEFT COMPARISON:  CT abdomen pelvis dated June 09, 2022. FINDINGS: Acute displaced fracture of the left femoral neck with significant varus angulation. No dislocation. No additional fracture. IMPRESSION: 1. Acute displaced left femoral neck fracture. Electronically Signed   By: Obie Dredge M.D.   On: 09/26/2023 14:00    Review of Systems  HENT:  Negative for ear discharge, ear pain, hearing loss and tinnitus.   Eyes:  Negative for photophobia and pain.  Respiratory:  Negative for cough and shortness of breath.   Cardiovascular:  Negative for chest pain.  Gastrointestinal:  Negative for abdominal pain, nausea and vomiting.  Genitourinary:  Negative for dysuria, flank pain, frequency and urgency.  Musculoskeletal:  Positive for arthralgias (Left hip). Negative for back pain, myalgias and neck pain.  Neurological:  Negative for dizziness and headaches.  Hematological:  Does not bruise/bleed easily.  Psychiatric/Behavioral:  The patient is not nervous/anxious.    Blood pressure (!) 143/62, pulse 60, resp. rate 20, SpO2 94%. Physical Exam Constitutional:      General: She is not in acute distress.  Appearance: She is well-developed. She is not diaphoretic.  HENT:     Head: Normocephalic and atraumatic.  Eyes:     General: No scleral icterus.       Right eye: No discharge.        Left eye: No discharge.     Conjunctiva/sclera:  Conjunctivae normal.  Cardiovascular:     Rate and Rhythm: Normal rate and regular rhythm.  Pulmonary:     Effort: Pulmonary effort is normal. No respiratory distress.  Musculoskeletal:     Cervical back: Normal range of motion.     Comments: LLE No traumatic wounds, ecchymosis, or rash  Mod TTP hip  No knee or ankle effusion  Knee stable to varus/ valgus and anterior/posterior stress  Sens DPN, SPN, TN intact  Motor EHL, ext, flex, evers 5/5  DP 2+, PT 2+, No significant edema  Skin:    General: Skin is warm and dry.  Neurological:     Mental Status: She is alert.  Psychiatric:        Mood and Affect: Mood normal.        Behavior: Behavior normal.     Assessment/Plan: Left hip fx -- Plan hip hemi tomorrow with Dr. Eulah Pont. Please keep NPO after MN.    Freeman Caldron, PA-C Orthopedic Surgery [phone] 09/26/2023, 2:43 PM

## 2023-09-26 NOTE — ED Notes (Signed)
ED TO INPATIENT HANDOFF REPORT  ED Nurse Name and Phone #: chris [phone]  S Name/Age/Gender Barbara [name] 82 y.o. female Room/Bed: RESUSC/RESUSC  Code Status   Code Status: Full Code  Home/SNF/Other Home Patient oriented to: self, place, time, and situation Is this baseline? Yes   Triage Complete: Triage complete  Chief Complaint Closed left subtrochanteric femur fracture (HCC) [S72.22XA]  Triage Note Pt BIB [facility] d/t fall at home d/t slipping on steps. Pt denies hitting head, is on Eliquis. Does have Lt hip pain & rotation. VSS, no PIV, CBG 129.   Allergies Allergies  Allergen Reactions   Lidocaine Anaphylaxis   Morphine Other (See Comments)    "Body shuts down"   Procaine Hcl Anaphylaxis, Rash and Other (See Comments)    "Anything with 'caine' in it "   Sulfonamide Derivatives Hives   Amlodipine Swelling   Norco [Hydrocodone-Acetaminophen] Nausea And Vomiting and Other (See Comments)    States does ok with IV form    Tramadol Nausea And Vomiting   Vytorin [Ezetimibe-Simvastatin] Other (See Comments)    Joint pain   Tape Itching and Other (See Comments)    Patient prefers either paper tape or Coban wrap    Level of Care/Admitting Diagnosis ED Disposition     ED Disposition  Admit   Condition  --   Comment  Hospital Area: [facility] [[id number]]  Level of Care: Med-Surg [16]  May admit patient to [facility] or [facility] if equivalent level of care is available:: Yes  Covid Evaluation: Asymptomatic - no recent exposure (last 10 days) testing not required  Diagnosis: Closed left subtrochanteric femur fracture [facility]) [[id number]]  Admitting Physician: Chevis Pretty  Attending Physician: Chevis Pretty  Certification:: I certify this patient will need inpatient services for at least 2 midnights  Expected Medical Readiness: 09/30/2023          B Medical/Surgery History Past Medical History:  Diagnosis Date    A-fib [facility])    Allergy    CAD (coronary artery disease)    sees Dr. Nicki Guadalajara  cardiac stents - 2000   CHF (congestive heart failure) (HCC)    Colon polyps    Complication of anesthesia    rash/hives with "caines"   Dyspnea    02/12/18 " when my heart gets out of rhythm, it has not been out of rhythym- since I have been on Tikosyn (11/2017)   Dysrhythmia    afib fib   GERD (gastroesophageal reflux disease)    takes OTC- Omeprazole- prn   Heart murmur    History of kidney stones    History of stress test    show normal perfusion without scar or ischemia, post EF 68%   Hx of echocardiogram    show an EF 55%-60% range with grade 1 diastolic dysfunction, she had mitral anular calcification with mild MR, moderate LA dilation and mild pulmonary hypertension with a PA estimated pressure of 39mm   Hyperlipidemia    Hypertension    NSTEMI (non-ST elevated myocardial infarction) (HCC)    Osteoarthritis    Pacemaker    Pneumonia    hx of 2015    PONV (postoperative nausea and vomiting)    Sleep apnea    Past Surgical History:  Procedure Laterality Date   CARDIAC CATHETERIZATION     11/2017   cardiac stents  2000   COLONOSCOPY  01-05-14   per Dr. Dorena Cookey, clear, no repeats needed  COLONOSCOPY     CORONARY STENT PLACEMENT  2000   in LAD   CYSTOSCOPY WITH RETROGRADE PYELOGRAM, URETEROSCOPY AND STENT PLACEMENT Left 06/11/2022   Procedure: CYSTOSCOPY WITH RETROGRADE PYELOGRAM, LEFT STENT PLACEMENT;  Surgeon: Crista Elliot, MD;  Location: [facility];  Service: Urology;  Laterality: Left;   CYSTOSCOPY/URETEROSCOPY/HOLMIUM LASER/STENT PLACEMENT Left 06/25/2022   Procedure: CYSTOSCOPY LEFT URETEROSCOPY/HOLMIUM LASER/STENT PLACEMENT;  Surgeon: Bjorn Pippin, MD;  Location: [facility];  Service: Urology;  Laterality: Left;  1 HR FOR THIS CASE   DIRECT LARYNGOSCOPY WITH RADIAESSE INJECTION N/A 02/13/2018   Procedure: DIRECT LARYNGOSCOPY WITH RADIAESSE INJECTION;  Surgeon: Christia Reading, MD;   Location: [facility] OR;  Service: ENT;  Laterality: N/A;   EYE SURGERY Left    CATARACT REMOVAL   KNEE ARTHROSCOPY Left 01/06/2015   Procedure: LEFT KNEE ARTHROSCOPY, abrasion chondroplasty of the medial femerol condryl,medial and lateral menisectomy, microfracture , synovectomy of the suprpatellar pouch;  Surgeon: Ranee Gosselin, MD;  Location: [facility];  Service: Orthopedics;  Laterality: Left;   LEFT HEART CATH AND CORONARY ANGIOGRAPHY N/A 02/26/2019   Procedure: LEFT HEART CATH AND CORONARY ANGIOGRAPHY;  Surgeon: Lennette Bihari, MD;  Location: [facility];  Service: Cardiovascular;  Laterality: N/A;   LEFT HEART CATH AND CORONARY ANGIOGRAPHY N/A 05/06/2022   Procedure: LEFT HEART CATH AND CORONARY ANGIOGRAPHY;  Surgeon: Yvonne Kendall, MD;  Location: [facility];  Service: Cardiovascular;  Laterality: N/A;   MICROLARYNGOSCOPY W/VOCAL CORD INJECTION N/A 08/07/2018   Procedure: MICROLARYNGOSCOPY WITH VOCAL CORD INJECTION OF PROLARYN;  Surgeon: Christia Reading, MD;  Location: [facility] OR;  Service: ENT;  Laterality: N/A;  JET VENTILATION   PACEMAKER IMPLANT N/A 05/08/2022   Procedure: PACEMAKER IMPLANT;  Surgeon: Regan Lemming, MD;  Location: [facility];  Service: Cardiovascular;  Laterality: N/A;   REVERSE SHOULDER ARTHROPLASTY Right 03/23/2020   Procedure: REVERSE SHOULDER ARTHROPLASTY;  Surgeon: Yolonda Kida, MD;  Location: [facility] OR;  Service: Orthopedics;  Laterality: Right;   VAGINAL HYSTERECTOMY  1971     A IV Location/Drains/Wounds Patient Lines/Drains/Airways Status     Active Line/Drains/Airways     Name Placement date Placement time Site Days   Peripheral IV 09/26/23 20 G Anterior;Right Forearm 09/26/23  1317  Forearm  less than 1   Ureteral Drain/Stent Left ureter 6 Fr. 06/25/22  1423  Left ureter  458            Intake/Output Last 24 hours No intake or output data in the 24 hours ending 09/26/23 1643  Labs/Imaging Results for orders placed or performed  during the hospital encounter of 09/26/23 (from the past 48 hour(s))  CBG monitoring, ED     Status: None   Collection Time: 09/26/23 12:45 PM  Result Value Ref Range   Glucose-Capillary 91 70 - 99 mg/dL    Comment: Glucose reference range applies only to samples taken after fasting for at least 8 hours.  Basic metabolic panel     Status: Abnormal   Collection Time: 09/26/23 12:48 PM  Result Value Ref Range   Sodium 139 135 - 145 mmol/L   Potassium 3.4 (L) 3.5 - 5.1 mmol/L   Chloride 103 98 - 111 mmol/L   CO2 24 22 - 32 mmol/L   Glucose, Bld 100 (H) 70 - 99 mg/dL    Comment: Glucose reference range applies only to samples taken after fasting for at least 8 hours.   BUN 17 8 - 23 mg/dL  Creatinine, Ser 1.53 (H) 0.44 - 1.00 mg/dL   Calcium 9.8 8.9 - 57.8 mg/dL   GFR, Estimated 33 (L) >60 mL/min    Comment: (NOTE) Calculated using the CKD-EPI Creatinine Equation (2021)    Anion gap 12 5 - 15    Comment: Performed at [facility] Lab, [address] [place], [place] [address]  CBC with Differential     Status: Abnormal   Collection Time: 09/26/23 12:48 PM  Result Value Ref Range   WBC 5.3 4.0 - 10.5 K/uL   RBC 4.07 3.87 - 5.11 MIL/uL   Hemoglobin 10.9 (L) 12.0 - 15.0 g/dL   HCT 95.2 (L) 84.1 - 32.4 %   MCV 87.7 80.0 - 100.0 fL   MCH 26.8 26.0 - 34.0 pg   MCHC 30.5 30.0 - 36.0 g/dL   RDW 40.1 (H) 02.7 - 25.3 %   Platelets 183 150 - 400 K/uL   nRBC 0.0 0.0 - 0.2 %   Neutrophils Relative % 80 %   Neutro Abs 4.2 1.7 - 7.7 K/uL   Lymphocytes Relative 9 %   Lymphs Abs 0.5 (L) 0.7 - 4.0 K/uL   Monocytes Relative 8 %   Monocytes Absolute 0.4 0.1 - 1.0 K/uL   Eosinophils Relative 2 %   Eosinophils Absolute 0.1 0.0 - 0.5 K/uL   Basophils Relative 0 %   Basophils Absolute 0.0 0.0 - 0.1 K/uL   Immature Granulocytes 1 %   Abs Immature Granulocytes 0.03 0.00 - 0.07 K/uL    Comment: Performed at [facility], [address] [place], [place] [address]  Protime-INR     Status:  Abnormal   Collection Time: 09/26/23 12:48 PM  Result Value Ref Range   Prothrombin Time 16.1 (H) 11.4 - 15.2 seconds   INR 1.3 (H) 0.8 - 1.2    Comment: (NOTE) INR goal varies based on device and disease states. Performed at [facility], [address], [place], [place] [address]   Type and screen [facility]     Status: None   Collection Time: 09/26/23  1:00 PM  Result Value Ref Range   ABO/RH(D) B POS    Antibody Screen NEG    Sample Expiration      09/29/2023,2359 Performed at [facility], [address] [place], [place] [address]   ABO/Rh     Status: None   Collection Time: 09/26/23  1:06 PM  Result Value Ref Range   ABO/RH(D)      B POS Performed at [facility], [address] [place], [place] [address]    DG Chest Portable 1 View  Result Date: 09/26/2023 CLINICAL DATA:  Left hip fracture.  Fall. EXAM: PORTABLE CHEST 1 VIEW COMPARISON:  Chest x-ray dated August 04, 2022. FINDINGS: Unchanged left chest wall pacemaker. Similar mild cardiomegaly. Chronic scarring at the left lung base. No focal consolidation, pleural effusion, or pneumothorax. No acute osseous abnormality. IMPRESSION: 1. No active disease. Electronically Signed   By: Obie Dredge M.D.   On: 09/26/2023 14:01   DG Hip Unilat W or Wo Pelvis 2-3 Views Left  Result Date: 09/26/2023 CLINICAL DATA:  Left hip pain after fall. EXAM: DG HIP (WITH OR WITHOUT PELVIS) 2-3V LEFT COMPARISON:  CT abdomen pelvis dated June 09, 2022. FINDINGS: Acute displaced fracture of the left femoral neck with significant varus angulation. No dislocation. No additional fracture. IMPRESSION: 1. Acute displaced left femoral neck fracture. Electronically Signed  By: Obie Dredge M.D.   On: 09/26/2023 14:00    Pending Labs Unresulted Labs (From admission, onward)     Start     Ordered   09/27/23 0500  Basic metabolic panel  Tomorrow morning,   R        09/26/23 1626   09/27/23 0500  CBC   Tomorrow morning,   R        09/26/23 1626   09/27/23 0500  Magnesium  Tomorrow morning,   R        09/26/23 1626   09/27/23 0500  Phosphorus  Tomorrow morning,   R        09/26/23 1626   09/26/23 1627  CBC  (heparin)  Once,   R       Comments: Baseline for heparin therapy IF NOT ALREADY DRAWN.  Notify MD if PLT < 100 K.    09/26/23 1626   09/26/23 1627  Creatinine, serum  (heparin)  Once,   R       Comments: Baseline for heparin therapy IF NOT ALREADY DRAWN.    09/26/23 1626   09/26/23 1248  Urinalysis, Routine w reflex microscopic -Urine, Clean Catch  Once,   URGENT       Question:  Specimen Source  Answer:  Urine, Clean Catch   09/26/23 1248            Vitals/Pain Today's Vitals   09/26/23 1445 09/26/23 1500 09/26/23 1501 09/26/23 1511  BP: 130/77 (!) 142/63    Pulse: (!) 59 60 60   Resp:      Temp:    (!) 97.2 F (36.2 C)  TempSrc:    Temporal  SpO2: 95%  95%   PainSc:        Isolation Precautions No active isolations  Medications Medications  furosemide (LASIX) tablet 20 mg (has no administration in time range)  hydrALAZINE (APRESOLINE) tablet 150 mg (has no administration in time range)  isosorbide mononitrate (IMDUR) 24 hr tablet 30 mg (has no administration in time range)  metoprolol succinate (TOPROL-XL) 24 hr tablet 100 mg (has no administration in time range)  nitroGLYCERIN (NITROSTAT) SL tablet 0.4 mg (has no administration in time range)  rosuvastatin (CRESTOR) tablet 40 mg (has no administration in time range)  LORazepam (ATIVAN) tablet 0.5 mg (has no administration in time range)  pantoprazole (PROTONIX) EC tablet 40 mg (has no administration in time range)  morphine (PF) 2 MG/ML injection 0.5 mg (has no administration in time range)  heparin injection 5,000 Units (has no administration in time range)  docusate sodium (COLACE) capsule 100 mg (has no administration in time range)  senna (SENOKOT) tablet 8.6 mg (has no administration in time range)   fentaNYL (SUBLIMAZE) injection 50 mcg (50 mcg Intravenous Given 09/26/23 1317)  ondansetron (ZOFRAN) injection 4 mg (4 mg Intravenous Given 09/26/23 1317)    Mobility walks     Focused Assessments Cardiac Assessment Handoff:    Lab Results  Component Value Date   TROPONINI 1.36 (HH) 02/26/2019   No results found for: "DDIMER" Does the Patient currently have chest pain? No    R Recommendations: See Admitting Provider Note  Report given to:   Additional Notes:

## 2023-09-27 ENCOUNTER — Inpatient Hospital Stay (HOSPITAL_COMMUNITY): Payer: PPO | Admitting: Anesthesiology

## 2023-09-27 ENCOUNTER — Other Ambulatory Visit: Payer: Self-pay

## 2023-09-27 ENCOUNTER — Encounter (HOSPITAL_COMMUNITY)
Admission: EM | Disposition: A | Discharge status: Skilled Nursing Facility | Payer: Self-pay | Source: Home / Self Care | Attending: Family Medicine

## 2023-09-27 ENCOUNTER — Inpatient Hospital Stay (HOSPITAL_COMMUNITY): Payer: PPO

## 2023-09-27 ENCOUNTER — Inpatient Hospital Stay (HOSPITAL_COMMUNITY): Payer: Self-pay | Admitting: Anesthesiology

## 2023-09-27 DIAGNOSIS — S72002A Fracture of unspecified part of neck of left femur, initial encounter for closed fracture: Secondary | ICD-10-CM

## 2023-09-27 DIAGNOSIS — S7222XD Displaced subtrochanteric fracture of left femur, subsequent encounter for closed fracture with routine healing: Secondary | ICD-10-CM | POA: Diagnosis not present

## 2023-09-27 HISTORY — PX: HIP ARTHROPLASTY: SHX981

## 2023-09-27 LAB — BASIC METABOLIC PANEL
Anion gap: 12 (ref 5–15)
BUN: 22 mg/dL (ref 8–23)
CO2: 21 mmol/L — ABNORMAL LOW (ref 22–32)
Calcium: 9.9 mg/dL (ref 8.9–10.3)
Chloride: 105 mmol/L (ref 98–111)
Creatinine, Ser: 1.58 mg/dL — ABNORMAL HIGH (ref 0.44–1.00)
GFR, Estimated: 32 mL/min — ABNORMAL LOW (ref >60)
Glucose, Bld: 84 mg/dL (ref 70–99)
Potassium: 3.7 mmol/L (ref 3.5–5.1)
Sodium: 138 mmol/L (ref 135–145)

## 2023-09-27 LAB — CBC
HCT: 33.4 % — ABNORMAL LOW (ref 36.0–46.0)
Hemoglobin: 10.4 g/dL — ABNORMAL LOW (ref 12.0–15.0)
MCH: 27 pg (ref 26.0–34.0)
MCHC: 31.1 g/dL (ref 30.0–36.0)
MCV: 86.8 fL (ref 80.0–100.0)
Platelets: 179 10*3/uL (ref 150–400)
RBC: 3.85 MIL/uL — ABNORMAL LOW (ref 3.87–5.11)
RDW: 16.5 % — ABNORMAL HIGH (ref 11.5–15.5)
WBC: 7.4 10*3/uL (ref 4.0–10.5)
nRBC: 0 % (ref 0.0–0.2)

## 2023-09-27 LAB — PHOSPHORUS: Phosphorus: 3.4 mg/dL (ref 2.5–4.6)

## 2023-09-27 LAB — SURGICAL PCR SCREEN
MRSA, PCR: NEGATIVE
Staphylococcus aureus: NEGATIVE

## 2023-09-27 LAB — MAGNESIUM: Magnesium: 1.8 mg/dL (ref 1.7–2.4)

## 2023-09-27 SURGERY — HEMIARTHROPLASTY, HIP, DIRECT ANTERIOR APPROACH, FOR FRACTURE
Anesthesia: General | Site: Hip | Laterality: Left

## 2023-09-27 MED ORDER — DOCUSATE SODIUM 100 MG PO CAPS
100.0000 mg | ORAL_CAPSULE | Freq: Two times a day (BID) | ORAL | Status: DC
  Administered 2023-09-27 – 2023-09-29 (×4): 100 mg via ORAL
  Filled 2023-09-27 (×7): qty 1

## 2023-09-27 MED ORDER — ONDANSETRON HCL 4 MG/2ML IJ SOLN: 4.0000 mg | Freq: Four times a day (QID) | INTRAMUSCULAR | Status: DC | PRN

## 2023-09-27 MED ORDER — CHLORHEXIDINE GLUCONATE 0.12 % MT SOLN
OROMUCOSAL | Status: AC
  Administered 2023-09-27: 15 mL via OROMUCOSAL
  Filled 2023-09-27: qty 15

## 2023-09-27 MED ORDER — ONDANSETRON HCL 4 MG PO TABS: 4.0000 mg | ORAL_TABLET | Freq: Four times a day (QID) | ORAL | Status: DC | PRN

## 2023-09-27 MED ORDER — DEXAMETHASONE SODIUM PHOSPHATE 10 MG/ML IJ SOLN
INTRAMUSCULAR | Status: AC
  Filled 2023-09-27: qty 1

## 2023-09-27 MED ORDER — CHLORHEXIDINE GLUCONATE 0.12 % MT SOLN: 15.0000 mL | Freq: Once | OROMUCOSAL | Status: AC

## 2023-09-27 MED ORDER — ONDANSETRON HCL 4 MG/2ML IJ SOLN
INTRAMUSCULAR | Status: DC | PRN
  Administered 2023-09-27: 4 mg via INTRAVENOUS

## 2023-09-27 MED ORDER — TRANEXAMIC ACID-NACL 1000-0.7 MG/100ML-% IV SOLN
INTRAVENOUS | Status: AC
  Filled 2023-09-27: qty 100

## 2023-09-27 MED ORDER — ACETAMINOPHEN 500 MG PO TABS
1000.0000 mg | ORAL_TABLET | Freq: Four times a day (QID) | ORAL | Status: AC
Start: 2023-09-27 — End: 2023-09-28
  Administered 2023-09-27 – 2023-09-28 (×3): 1000 mg via ORAL
  Filled 2023-09-27 (×3): qty 2

## 2023-09-27 MED ORDER — METHOCARBAMOL 500 MG PO TABS
500.0000 mg | ORAL_TABLET | Freq: Four times a day (QID) | ORAL | Status: DC | PRN
  Administered 2023-09-28 – 2023-09-30 (×3): 500 mg via ORAL
  Filled 2023-09-27 (×3): qty 1

## 2023-09-27 MED ORDER — ORAL CARE MOUTH RINSE: 15.0000 mL | OROMUCOSAL | Status: DC | PRN

## 2023-09-27 MED ORDER — METOCLOPRAMIDE HCL 5 MG/ML IJ SOLN: 5.0000 mg | Freq: Three times a day (TID) | INTRAMUSCULAR | Status: DC | PRN

## 2023-09-27 MED ORDER — PHENYLEPHRINE 80 MCG/ML (10ML) SYRINGE FOR IV PUSH (FOR BLOOD PRESSURE SUPPORT)
PREFILLED_SYRINGE | INTRAVENOUS | Status: AC
  Filled 2023-09-27: qty 20

## 2023-09-27 MED ORDER — POLYETHYLENE GLYCOL 3350 17 G PO PACK
17.0000 g | PACK | Freq: Every day | ORAL | Status: DC | PRN
  Administered 2023-09-30: 17 g via ORAL
  Filled 2023-09-27: qty 1

## 2023-09-27 MED ORDER — SODIUM CHLORIDE 0.9 % IR SOLN
Status: DC | PRN
  Administered 2023-09-27: 1000 mL

## 2023-09-27 MED ORDER — OXYCODONE HCL 5 MG PO TABS: 2.5000 mg | ORAL_TABLET | ORAL | Status: DC | PRN

## 2023-09-27 MED ORDER — MENTHOL 3 MG MT LOZG: 1.0000 | LOZENGE | OROMUCOSAL | Status: DC | PRN

## 2023-09-27 MED ORDER — PHENOL 1.4 % MT LIQD
1.0000 | OROMUCOSAL | Status: DC | PRN
  Filled 2023-09-27: qty 177

## 2023-09-27 MED ORDER — DEXAMETHASONE SODIUM PHOSPHATE 10 MG/ML IJ SOLN
INTRAMUSCULAR | Status: DC | PRN
  Administered 2023-09-27: 10 mg via INTRAVENOUS

## 2023-09-27 MED ORDER — SUGAMMADEX SODIUM 200 MG/2ML IV SOLN
INTRAVENOUS | Status: DC | PRN
  Administered 2023-09-27: 200 mg via INTRAVENOUS

## 2023-09-27 MED ORDER — FENTANYL CITRATE (PF) 250 MCG/5ML IJ SOLN
INTRAMUSCULAR | Status: AC
  Filled 2023-09-27: qty 5

## 2023-09-27 MED ORDER — CEFAZOLIN SODIUM-DEXTROSE 2-4 GM/100ML-% IV SOLN: 2.0000 g | Freq: Four times a day (QID) | INTRAVENOUS | Status: DC

## 2023-09-27 MED ORDER — TRANEXAMIC ACID-NACL 1000-0.7 MG/100ML-% IV SOLN
1000.0000 mg | Freq: Once | INTRAVENOUS | Status: AC
  Administered 2023-09-27: 1000 mg via INTRAVENOUS
  Filled 2023-09-27: qty 100

## 2023-09-27 MED ORDER — BISACODYL 10 MG RE SUPP: 10.0000 mg | Freq: Every day | RECTAL | Status: DC | PRN

## 2023-09-27 MED ORDER — CEFAZOLIN SODIUM-DEXTROSE 2-4 GM/100ML-% IV SOLN
INTRAVENOUS | Status: AC
  Filled 2023-09-27: qty 100

## 2023-09-27 MED ORDER — METOCLOPRAMIDE HCL 5 MG PO TABS: 5.0000 mg | ORAL_TABLET | Freq: Three times a day (TID) | ORAL | Status: DC | PRN

## 2023-09-27 MED ORDER — ROCURONIUM BROMIDE 10 MG/ML (PF) SYRINGE
PREFILLED_SYRINGE | INTRAVENOUS | Status: AC
  Filled 2023-09-27: qty 10

## 2023-09-27 MED ORDER — LACTATED RINGERS IV SOLN: INTRAVENOUS | Status: DC

## 2023-09-27 MED ORDER — HYDROMORPHONE HCL 1 MG/ML IJ SOLN: 0.5000 mg | INTRAMUSCULAR | Status: DC | PRN

## 2023-09-27 MED ORDER — OXYCODONE HCL 5 MG PO TABS
5.0000 mg | ORAL_TABLET | ORAL | Status: DC | PRN
  Administered 2023-09-28: 5 mg via ORAL
  Filled 2023-09-27: qty 1

## 2023-09-27 MED ORDER — ACETAMINOPHEN 325 MG PO TABS
325.0000 mg | ORAL_TABLET | Freq: Four times a day (QID) | ORAL | Status: DC | PRN
  Administered 2023-09-28 – 2023-09-30 (×4): 650 mg via ORAL
  Filled 2023-09-27 (×4): qty 2

## 2023-09-27 MED ORDER — CEFAZOLIN SODIUM-DEXTROSE 2-4 GM/100ML-% IV SOLN
2.0000 g | Freq: Two times a day (BID) | INTRAVENOUS | Status: AC
  Administered 2023-09-27: 2 g via INTRAVENOUS
  Filled 2023-09-27: qty 100

## 2023-09-27 MED ORDER — ONDANSETRON HCL 4 MG/2ML IJ SOLN
INTRAMUSCULAR | Status: AC
  Filled 2023-09-27: qty 2

## 2023-09-27 MED ORDER — METHOCARBAMOL 1000 MG/10ML IJ SOLN: 500.0000 mg | Freq: Four times a day (QID) | INTRAMUSCULAR | Status: DC | PRN

## 2023-09-27 MED ORDER — ALUM & MAG HYDROXIDE-SIMETH 200-200-20 MG/5ML PO SUSP
30.0000 mL | ORAL | Status: DC | PRN
Start: 2023-09-27 — End: 2023-09-30

## 2023-09-27 MED ORDER — PROPOFOL 10 MG/ML IV BOLUS
INTRAVENOUS | Status: DC | PRN
  Administered 2023-09-27: 100 mg via INTRAVENOUS

## 2023-09-27 MED ORDER — ORAL CARE MOUTH RINSE: 15.0000 mL | Freq: Once | OROMUCOSAL | Status: AC

## 2023-09-27 MED ORDER — PHENYLEPHRINE 80 MCG/ML (10ML) SYRINGE FOR IV PUSH (FOR BLOOD PRESSURE SUPPORT)
PREFILLED_SYRINGE | INTRAVENOUS | Status: DC | PRN
  Administered 2023-09-27: 160 ug via INTRAVENOUS
  Administered 2023-09-27 (×2): 80 ug via INTRAVENOUS
  Administered 2023-09-27: 160 ug via INTRAVENOUS
  Administered 2023-09-27: 80 ug via INTRAVENOUS

## 2023-09-27 MED ORDER — PROPOFOL 10 MG/ML IV BOLUS
INTRAVENOUS | Status: AC
  Filled 2023-09-27: qty 20

## 2023-09-27 MED ORDER — FENTANYL CITRATE (PF) 250 MCG/5ML IJ SOLN
INTRAMUSCULAR | Status: DC | PRN
  Administered 2023-09-27: 100 ug via INTRAVENOUS
  Administered 2023-09-27: 50 ug via INTRAVENOUS

## 2023-09-27 MED ORDER — ROCURONIUM BROMIDE 10 MG/ML (PF) SYRINGE
PREFILLED_SYRINGE | INTRAVENOUS | Status: DC | PRN
  Administered 2023-09-27: 60 mg via INTRAVENOUS

## 2023-09-27 SURGICAL SUPPLY — 51 items
BAG COUNTER SPONGE SURGICOUNT (BAG) IMPLANT
BLADE SAW SGTL 73X25 THK (BLADE) ×1 IMPLANT
BLADE SURG SZ10 CARB STEEL (BLADE) ×2 IMPLANT
BRUSH FEMORAL CANAL (MISCELLANEOUS) IMPLANT
CHLORAPREP W/TINT 26 (MISCELLANEOUS) ×1 IMPLANT
COVER SURGICAL LIGHT HANDLE (MISCELLANEOUS) ×1 IMPLANT
DRAPE IMP U-DRAPE 54X76 (DRAPES) ×1 IMPLANT
DRAPE INCISE IOBAN 66X45 STRL (DRAPES) ×1 IMPLANT
DRAPE SURG ORHT 6 SPLT 77X108 (DRAPES) ×2 IMPLANT
DRAPE U-SHAPE 47X51 STRL (DRAPES) ×1 IMPLANT
DRSG MEPILEX POST OP 4X12 (GAUZE/BANDAGES/DRESSINGS) IMPLANT
DRSG MEPILEX POST OP 4X8 (GAUZE/BANDAGES/DRESSINGS) ×1 IMPLANT
ELECT BLADE TIP CTD 4 INCH (ELECTRODE) ×1 IMPLANT
ELECT REM PT RETURN 15FT ADLT (MISCELLANEOUS) ×1 IMPLANT
EVACUATOR 1/8 PVC DRAIN (DRAIN) IMPLANT
FACESHIELD WRAPAROUND (MASK) ×4 IMPLANT
FACESHIELD WRAPAROUND OR TEAM (MASK) ×4 IMPLANT
GLOVE BIO SURGEON STRL SZ7.5 (GLOVE) ×2 IMPLANT
GLOVE BIOGEL PI IND STRL 7.5 (GLOVE) ×1 IMPLANT
GLOVE BIOGEL PI IND STRL 8 (GLOVE) ×1 IMPLANT
GLOVE SURG SYN 7.5 E (GLOVE) ×1 IMPLANT
GLOVE SURG SYN 7.5 PF PI (GLOVE) ×1 IMPLANT
GOWN STRL REUS W/ TWL LRG LVL3 (GOWN DISPOSABLE) ×1 IMPLANT
GOWN STRL REUS W/ TWL XL LVL3 (GOWN DISPOSABLE) ×1 IMPLANT
HEAD UNPLR 40XMDLR STRL HIP (Orthopedic Implant) IMPLANT
KIT BASIN OR (CUSTOM PROCEDURE TRAY) ×1 IMPLANT
KIT TURNOVER KIT A (KITS) IMPLANT
MANIFOLD NEPTUNE II (INSTRUMENTS) ×1 IMPLANT
NS IRRIG 1000ML POUR BTL (IV SOLUTION) ×1 IMPLANT
PACK TOTAL JOINT (CUSTOM PROCEDURE TRAY) ×1 IMPLANT
PRESSURIZER FEMORAL UNIV (MISCELLANEOUS) IMPLANT
PROTECTOR NERVE ULNAR (MISCELLANEOUS) ×2 IMPLANT
RETRIEVER SUT HEWSON (MISCELLANEOUS) ×1 IMPLANT
SLEEVE UNITRAX (Orthopedic Implant) IMPLANT
STAPLER VISISTAT 35W (STAPLE) ×1 IMPLANT
STEM STD OFFSET SZ3 32.5 (Stem) IMPLANT
STEM STD OFFSET SZ6 35 (Stem) ×1 IMPLANT
STRIP CLOSURE SKIN 1/2X4 (GAUZE/BANDAGES/DRESSINGS) IMPLANT
STRIP CLOSURE SKIN 1/4X4 (GAUZE/BANDAGES/DRESSINGS) ×1 IMPLANT
SUCTION TUBE FRAZIER 12FR DISP (SUCTIONS) ×1 IMPLANT
SUT MNCRL AB 4-0 PS2 18 (SUTURE) ×1 IMPLANT
SUT VIC AB 0 CT1 36 (SUTURE) ×2 IMPLANT
SUT VIC AB 1 CTX36XBRD ANBCTR (SUTURE) ×2 IMPLANT
SUT VIC AB 2-0 CT1 TAPERPNT 27 (SUTURE) ×2 IMPLANT
SUT VLOC 180 0 24IN GS25 (SUTURE) ×1 IMPLANT
SYR 50ML LL SCALE MARK (SYRINGE) ×1 IMPLANT
TOWEL OR 17X26 10 PK STRL BLUE (TOWEL DISPOSABLE) ×1 IMPLANT
TOWEL OR NON WOVEN STRL DISP B (DISPOSABLE) ×1 IMPLANT
TRAY CATH INTERMITTENT SS 16FR (CATHETERS) IMPLANT
TRAY FOLEY MTR SLVR 16FR STAT (SET/KITS/TRAYS/PACK) ×1 IMPLANT
WATER STERILE IRR 1000ML POUR (IV SOLUTION) ×2 IMPLANT

## 2023-09-27 NOTE — Plan of Care (Signed)
  Problem: Education: Goal: Knowledge of General Education information will improve Description: Including pain rating scale, medication(s)/side effects and non-pharmacologic comfort measures Outcome: Not Progressing   Problem: Health Behavior/Discharge Planning: Goal: Ability to manage health-related needs will improve Outcome: Not Progressing   Problem: Clinical Measurements: Goal: Ability to maintain clinical measurements within normal limits will improve Outcome: Not Progressing Goal: Will remain free from infection Outcome: Not Progressing Goal: Diagnostic test results will improve Outcome: Not Progressing Goal: Respiratory complications will improve Outcome: Not Progressing Goal: Cardiovascular complication will be avoided Outcome: Not Progressing   Problem: Activity: Goal: Risk for activity intolerance will decrease Outcome: Not Progressing   Problem: Nutrition: Goal: Adequate nutrition will be maintained Outcome: Not Progressing   Problem: Coping: Goal: Level of anxiety will decrease Outcome: Not Progressing   Problem: Elimination: Goal: Will not experience complications related to bowel motility Outcome: Not Progressing Goal: Will not experience complications related to urinary retention Outcome: Not Progressing   Problem: Pain Management: Goal: General experience of comfort will improve Outcome: Not Progressing   Problem: Safety: Goal: Ability to remain free from injury will improve Outcome: Not Progressing   Problem: Skin Integrity: Goal: Risk for impaired skin integrity will decrease Outcome: Not Progressing   Problem: Education: Goal: Verbalization of understanding the information provided (i.e., activity precautions, restrictions, etc) will improve Outcome: Not Progressing Goal: Individualized Educational Video(s) Outcome: Not Progressing   Problem: Activity: Goal: Ability to ambulate and perform ADLs will improve Outcome: Not Progressing    Problem: Clinical Measurements: Goal: Postoperative complications will be avoided or minimized Outcome: Not Progressing   Problem: Self-Concept: Goal: Ability to maintain and perform role responsibilities to the fullest extent possible will improve Outcome: Not Progressing   Problem: Pain Management: Goal: Pain level will decrease Outcome: Not Progressing

## 2023-09-27 NOTE — Anesthesia Procedure Notes (Signed)
Procedure Name: Intubation Date/Time: 09/27/2023 11:03 AM  Performed by: Zollie Beckers, CRNAPre-anesthesia Checklist: Patient identified, Emergency Drugs available, Suction available and Patient being monitored Patient Re-evaluated:Patient Re-evaluated prior to induction Oxygen Delivery Method: Circle system utilized Preoxygenation: Pre-oxygenation with 100% oxygen Induction Type: IV induction Ventilation: Mask ventilation without difficulty Laryngoscope Size: Mac and 3 Grade View: Grade I Tube type: Oral Tube size: 7.0 mm Number of attempts: 1 Airway Equipment and Method: Stylet Placement Confirmation: ETT inserted through vocal cords under direct vision, positive ETCO2 and breath sounds checked- equal and bilateral Secured at: 21 cm Tube secured with: Tape Dental Injury: Teeth and Oropharynx as per pre-operative assessment

## 2023-09-27 NOTE — Op Note (Signed)
09/26/2023 - 09/27/2023  12:14 PM  PATIENT:  Barbara [name]   MRN: [id number]  PRE-OPERATIVE DIAGNOSIS:  Left Hip fracture  POST-OPERATIVE DIAGNOSIS:  Left Hip fracture  PROCEDURE:  Procedure(s): ARTHROPLASTY BIPOLAR HIP (HEMIARTHROPLASTY)  PREOPERATIVE INDICATIONS:  Barbara [name] is an 82 y.o. female who was admitted 09/26/2023 with a diagnosis of Closed left subtrochanteric femur fracture (HCC) and elected for surgical management.  The risks benefits and alternatives were discussed with the patient including but not limited to the risks of nonoperative treatment, versus surgical intervention including infection, bleeding, nerve injury, periprosthetic fracture, the need for revision surgery, dislocation, leg length discrepancy, blood clots, cardiopulmonary complications, morbidity, mortality, among others, and they were willing to proceed.  Predicted outcome is good, although there will be at least a six to nine month expected recovery.   OPERATIVE REPORT     SURGEON:  Margarita Rana, MD    ASSISTANT:  Levester Fresh, PA-C, he was present and scrubbed throughout the case, critical for completion in a timely fashion, and for retraction, instrumentation, and closure.     ANESTHESIA:  General    COMPLICATIONS:  None.      COMPONENTS:  [employer] Acolade: Femoral stem: 3, Femoral Head:40, Neck:-4   PROCEDURE IN DETAIL: The patient was met in the holding area and identified.  The appropriate hip  was marked at the operative site. The patient was then transported to the OR and  placed under general anesthesia.  At that point, the patient was  placed in the lateral decubitus position with the operative side up and  secured to the operating room table and all bony prominences padded.     The operative lower extremity was prepped from the iliac crest to the toes.  Sterile draping was performed.  Time out was performed prior to incision.      A routine posterolateral approach was  utilized via sharp dissection  carried down to the subcutaneous tissue.  Gross bleeders were Bovie  coagulated.  The iliotibial band was identified and incised  along the length of the skin incision.  Self-retaining retractors were  inserted. I examined the bursa there was significant hematoma and edema I performed a bursectomy here.  With the hip internally rotated, the short external rotators  were identified. The piriformis was tagged with FiberWire, and the hip capsule released in a T-type fashion.  The femoral neck was exposed, and I resected the femoral neck using the appropriate jig. This was performed at approximately a thumb's breadth above the lesser trochanter.    I then exposed the deep acetabulum, cleared out any tissue including the ligamentum teres, and included the hip capsule in the FiberWire used above and below the T.    I then prepared the proximal femur using the cookie-cutter, the lateralizing reamer, and then sequentially broached.  A trial utilized, and I reduced the hip and it was found to have excellent stability with functional range of motion. The trial components were then removed.   The canal and acetabulum were thoroughly irrigated  I inserted the pressfit stem and placed the head and neck collar. The hip was reduced with appropriate force and was stable through a range of motion.   I then used a 2 mm drill bits to pass the FiberWire suture from the capsule and puriform is through the greater trochanter, and secured this. Excellent posterior capsular repair was achieved. I also closed the T in the capsule.  I then irrigated the hip copiously again  with pulse lavage, and repaired the fascia with Vicryl, followed by Vicryl for the subcutaneous tissue, Monocryl for the skin, Steri-Strips and sterile gauze. The wounds were injected. The patient was then awakened and returned to PACU in stable and satisfactory condition. There were no complications.  POST-OP PLAN: Weight  bearing as tolerated. DVT px will consist of SCD's and chemical px  Margarita Rana, MD Orthopedic Surgeon [phone]   09/27/2023 12:14 PM

## 2023-09-27 NOTE — Anesthesia Postprocedure Evaluation (Signed)
Anesthesia Post Note  Patient: ESCARLETT STEGE  Procedure(s) Performed: ARTHROPLASTY BIPOLAR HIP (HEMIARTHROPLASTY) (Left: Hip)     Patient location during evaluation: PACU Anesthesia Type: General Level of consciousness: sedated and patient cooperative Pain management: pain level controlled Vital Signs Assessment: post-procedure vital signs reviewed and stable Respiratory status: spontaneous breathing Cardiovascular status: stable Anesthetic complications: no   No notable events documented.  Last Vitals:  Vitals:   09/27/23 1625 09/27/23 1633  BP: (!) 166/53   Pulse:  60  Resp:    Temp:    SpO2:  98%    Last Pain:  Vitals:   09/27/23 1349  TempSrc:   PainSc: 0-No pain                 Lewie Loron

## 2023-09-27 NOTE — Interval H&P Note (Signed)
History and Physical Interval Note:  09/27/2023 9:11 AM  Barbara [name]  has presented today for surgery, with the diagnosis of Left Hip fx.  The various methods of treatment have been discussed with the patient and family. After consideration of risks, benefits and other options for treatment, the patient has consented to  Procedure(s): ARTHROPLASTY BIPOLAR HIP (HEMIARTHROPLASTY) (Left) as a surgical intervention.  The patient's history has been reviewed, patient examined, no change in status, stable for surgery.  I have reviewed the patient's chart and labs.  Questions were answered to the patient's satisfaction.     Sheral Apley

## 2023-09-27 NOTE — Transfer of Care (Signed)
Immediate Anesthesia Transfer of Care Note  Patient: Barbara [name]  Procedure(s) Performed: ARTHROPLASTY BIPOLAR HIP (HEMIARTHROPLASTY) (Left: Hip)  Patient Location: PACU  Anesthesia Type:General  Level of Consciousness: awake and drowsy  Airway & Oxygen Therapy: Patient Spontanous Breathing and Patient connected to face mask oxygen  Post-op Assessment: Report given to RN and Post -op Vital signs reviewed and stable  Post vital signs: Reviewed and stable  Last Vitals:  Vitals Value Taken Time  BP 141/57 09/27/23 1300  Temp    Pulse 62 09/27/23 1302  Resp 20 09/27/23 1302  SpO2 98 % 09/27/23 1302  Vitals shown include unfiled device data.  Last Pain:  Vitals:   09/27/23 0905  TempSrc: Oral  PainSc:       Patients Stated Pain Goal: 0 (09/27/23 0345)  Complications: No notable events documented.

## 2023-09-27 NOTE — Progress Notes (Signed)
PROGRESS NOTE    Barbara [name]  WUJ:[id number] DOB: 15-Jun-1941 DOA: 09/26/2023 PCP: [name], MD   Brief Narrative:  This 82 y.o. female with PMH significant for CAD, diastolic CHF, A-fib on Eliquis, essential hypertension, hyperlipidemia, anxiety disorder, CKD stage IIIa presented in the ED status post mechanical fall with left hip pain.  Patient states she is fully functional, lives alone, she was looking at the dog and she missed the step and fell on her left side and has developed left hip pain.  She was unable to stand up.  She was on the floor for 20 minutes.  X-ray left hip Acute displaced left femoral neck fracture.  Orthopedic consulted.  Patient is s/p ORIF.  POD #1.  Assessment & Plan:   Principal Problem:   Closed left subtrochanteric femur fracture (HCC) Active Problems:   CAD (coronary artery disease/elevated troponin   CKD (chronic kidney disease), stage III (HCC)   PAF (paroxysmal atrial fibrillation) (HCC)   Essential hypertension   Hyperlipidemia   Gastroesophageal reflux disease   Osteoarthritis  Left femoral neck fracture status post mechanical fall: Patient tripped and fell on the left side while going downstairs and has developed left thigh pain. X-ray shows acute displaced left femoral neck fracture. Orthopedics consulted.  Status post ORIF POD #1. Adequate pain control. Continue IV hydration. PT and OT evaluation postsurgery.   Essential hypertension: Continue hydralazine 150 mg daily Continue Toprol 100 mg daily Continue Imdur 30 mg daily   Hyperlipidemia: Continue Crestor 40 mg daily   CAD: Continue aspirin, Crestor, Imdur.   Paroxysmal A-fib: Heart rate is well-controlled. Continue metoprolol Will resume Eliquis from tomorrow.  Anxiety disorder Continue lorazepam 0.5 mg q6hr prn   GERD Continue pantoprazole 40 mg daily.   CKD stage IIIA: Serum creatinine at baseline.   Permanent pacemaker: Stable.  Continue follow-up  outpatient    DVT prophylaxis:  heparin sq Code Status: Full code Family Communication: No family at bd side Disposition Plan:   Status is: Inpatient Remains inpatient appropriate because: Admitted for left femoral neck fracture underwent ORIF.  POD 1     Consultants:  Orthopeadics  Procedures: s/p ORIF  Antimicrobials:  Anti-infectives (From admission, onward)    Start     Dose/Rate Route Frequency Ordered Stop   09/27/23 2300  ceFAZolin (ANCEF) IVPB 2g/100 mL premix        2 g 200 mL/hr over 30 Minutes Intravenous Every 12 hours 09/27/23 1441 09/28/23 0959   09/27/23 1345  ceFAZolin (ANCEF) IVPB 2g/100 mL premix  Status:  Discontinued        2 g 200 mL/hr over 30 Minutes Intravenous Every 6 hours 09/27/23 1344 09/27/23 1441   09/27/23 0930  ceFAZolin (ANCEF) IVPB 2g/100 mL premix        2 g 200 mL/hr over 30 Minutes Intravenous To [facility] 09/26/23 2041 09/27/23 1114   09/27/23 0859  ceFAZolin (ANCEF) 2-4 GM/100ML-% IVPB       Note to Pharmacy: Crissie Sickles: cabinet override      09/27/23 0859 09/27/23 1120       Subjective: Patient was seen and examined at bedside.  Overnight events noted.   Patient reports doing much better.  She reports pain is reasonably controlled now.  S/p ORIF POD 1.  Objective: Vitals:   09/27/23 1300 09/27/23 1315 09/27/23 1330 09/27/23 1349  BP: (!) 141/57 (!) 162/60 (!) 174/55 139/65  Pulse: 61 60 60 60  Resp: 14 15 15  16  Temp:      TempSrc:      SpO2: 98% 93% 96% 98%  Weight:      Height:        Intake/Output Summary (Last 24 hours) at 09/27/2023 1558 Last data filed at 09/27/2023 1518 Gross per 24 hour  Intake 1510.16 ml  Output 1400 ml  Net 110.16 ml   Filed Weights   09/27/23 0910  Weight: 71 kg    Examination:  General exam: Appears calm and comfortable, deconditioned, not in any acute distress. Respiratory system: Clear to auscultation. Respiratory effort normal. Cardiovascular system: S1 & S2  heard, RRR. No JVD, murmurs, rubs, gallops or clicks. No pedal edema. Gastrointestinal system: Abdomen is nondistended, soft and nontender. Normal bowel sounds heard. Central nervous system: Alert and oriented x 3. No focal neurological deficits. Extremities:  s/p ORIF POD # 1, tenderness + Skin: No rashes, lesions or ulcers Psychiatry: Judgement and insight appear normal. Mood & affect appropriate.     Data Reviewed: I have personally reviewed following labs and imaging studies  CBC: Recent Labs  Lab 09/26/23 1248 09/27/23 0519  WBC 5.3 7.4  NEUTROABS 4.2  --   HGB 10.9* 10.4*  HCT 35.7* 33.4*  MCV 87.7 86.8  PLT 183 179   Basic Metabolic Panel: Recent Labs  Lab 09/26/23 1248 09/27/23 0519  NA 139 138  K 3.4* 3.7  CL 103 105  CO2 24 21*  GLUCOSE 100* 84  BUN 17 22  CREATININE 1.53* 1.58*  CALCIUM 9.8 9.9  MG  --  1.8  PHOS  --  3.4   GFR: Estimated Creatinine Clearance: 25.8 mL/min (A) (by C-G formula based on SCr of 1.58 mg/dL (H)). Liver Function Tests: No results for input(s): "AST", "ALT", "ALKPHOS", "BILITOT", "PROT", "ALBUMIN" in the last 168 hours. No results for input(s): "LIPASE", "AMYLASE" in the last 168 hours. No results for input(s): "AMMONIA" in the last 168 hours. Coagulation Profile: Recent Labs  Lab 09/26/23 1248  INR 1.3*   Cardiac Enzymes: No results for input(s): "CKTOTAL", "CKMB", "CKMBINDEX", "TROPONINI" in the last 168 hours. BNP (last 3 results) No results for input(s): "PROBNP" in the last 8760 hours. HbA1C: No results for input(s): "HGBA1C" in the last 72 hours. CBG: Recent Labs  Lab 09/26/23 1245  GLUCAP 91   Lipid Profile: No results for input(s): "CHOL", "HDL", "LDLCALC", "TRIG", "CHOLHDL", "LDLDIRECT" in the last 72 hours. Thyroid Function Tests: No results for input(s): "TSH", "T4TOTAL", "FREET4", "T3FREE", "THYROIDAB" in the last 72 hours. Anemia Panel: No results for input(s): "VITAMINB12", "FOLATE", "FERRITIN",  "TIBC", "IRON", "RETICCTPCT" in the last 72 hours. Sepsis Labs: No results for input(s): "PROCALCITON", "LATICACIDVEN" in the last 168 hours.  Recent Results (from the past 240 hour(s))  Surgical pcr screen     Status: None   Collection Time: 09/26/23  9:17 PM   Specimen: Nasal Mucosa; Nasal Swab  Result Value Ref Range Status   MRSA, PCR NEGATIVE NEGATIVE Final   Staphylococcus aureus NEGATIVE NEGATIVE Final    Comment: (NOTE) The Xpert SA Assay (FDA approved for NASAL specimens in patients 65 years of age and older), is one component of a comprehensive surveillance program. It is not intended to diagnose infection nor to guide or monitor treatment. Performed at [facility], [address] [place], [place] [address]    Radiology Studies: DG Chest Portable 1 View  Result Date: 09/26/2023 CLINICAL DATA:  Left hip fracture.  Fall. EXAM: PORTABLE CHEST 1 VIEW  COMPARISON:  Chest x-ray dated August 04, 2022. FINDINGS: Unchanged left chest wall pacemaker. Similar mild cardiomegaly. Chronic scarring at the left lung base. No focal consolidation, pleural effusion, or pneumothorax. No acute osseous abnormality. IMPRESSION: 1. No active disease. Electronically Signed   By: Obie Dredge M.D.   On: 09/26/2023 14:01   DG Hip Unilat W or Wo Pelvis 2-3 Views Left  Result Date: 09/26/2023 CLINICAL DATA:  Left hip pain after fall. EXAM: DG HIP (WITH OR WITHOUT PELVIS) 2-3V LEFT COMPARISON:  CT abdomen pelvis dated June 09, 2022. FINDINGS: Acute displaced fracture of the left femoral neck with significant varus angulation. No dislocation. No additional fracture. IMPRESSION: 1. Acute displaced left femoral neck fracture. Electronically Signed   By: Obie Dredge M.D.   On: 09/26/2023 14:00     Scheduled Meds:  acetaminophen  1,000 mg Oral Q6H   docusate sodium  100 mg Oral BID   docusate sodium  100 mg Oral BID   heparin  5,000 Units Subcutaneous Q8H   hydrALAZINE  150 mg Oral BID    isosorbide mononitrate  30 mg Oral Daily   metoprolol succinate  100 mg Oral QPM   pantoprazole  40 mg Oral Daily   rosuvastatin  40 mg Oral Daily   senna  1 tablet Oral BID   Continuous Infusions:  sodium chloride Stopped (09/27/23 0843)    ceFAZolin (ANCEF) IV       LOS: 1 day    Time spent: 35 Mins    Willeen Niece, MD [facility]   If 7PM-7AM, please contact night-coverage

## 2023-09-28 DIAGNOSIS — S7222XD Displaced subtrochanteric fracture of left femur, subsequent encounter for closed fracture with routine healing: Secondary | ICD-10-CM | POA: Diagnosis not present

## 2023-09-28 MED ORDER — APIXABAN 2.5 MG PO TABS
2.5000 mg | ORAL_TABLET | Freq: Two times a day (BID) | ORAL | Status: DC
  Administered 2023-09-28 – 2023-09-30 (×5): 2.5 mg via ORAL
  Filled 2023-09-28 (×5): qty 1

## 2023-09-28 NOTE — Evaluation (Signed)
Occupational Therapy Evaluation Patient Details Name: Barbara [name] MRN: [id number] DOB: [birth year] Today's Date: 09/28/2023   History of Present Illness Pt is an 82 year old woman admitted on 11/22 after falling on her steps sustaining a L subtrochanetric hip fx. Underwent L hemiarthroplasty on 11/23. PMH: arthritis, HLD, HTN, afib, CAD, CHF, GERTD, sleep apnea on CPAP, pacemaker, B shoulder surgeries.   Clinical Impression   Pt ambulated with a cane and was mod I in self care and ADLs. She lives alone and drives. Pt presents with mild post operative pain, generalized weakness and decreased standing balance. She needs mod assist for bed mobility, min assist to stand from elevated surface (uses lift recliner at home) and walk a short distance with min assist, increased time and use of RW. Pt requires set up to max assist for ADLs. Pt prefers to return home, but cannot identify family member that can assist her. Patient will benefit from continued inpatient follow up therapy, <3 hours/day.      If plan is discharge home, recommend the following: A little help with walking and/or transfers;A lot of help with bathing/dressing/bathroom;Assistance with cooking/housework;Assist for transportation;Help with stairs or ramp for entrance    Functional Status Assessment  Patient has had a recent decline in their functional status and demonstrates the ability to make significant improvements in function in a reasonable and predictable amount of time.  Equipment Recommendations  None recommended by OT    Recommendations for Other Services       Precautions / Restrictions Precautions Precautions: Fall Restrictions Weight Bearing Restrictions: Yes LLE Weight Bearing: Weight bearing as tolerated      Mobility Bed Mobility Overal bed mobility: Needs Assistance Bed Mobility: Supine to Sit     Supine to sit: Mod assist, HOB elevated, Used rails     General bed mobility comments: cues  for technique and to self assist, supported L LE and assisted hips to EOB with bed pad    Transfers Overall transfer level: Needs assistance Equipment used: Rolling walker (2 wheels) Transfers: Sit to/from Stand Sit to Stand: Min assist, +2 safety/equipment           General transfer comment: cues to technique, assist to rise and steady, decreased control of descent to chair, pt is used to using a lift chair at home      Balance Overall balance assessment: Needs assistance   Sitting balance-Leahy Scale: Good       Standing balance-Leahy Scale: Poor                             ADL either performed or assessed with clinical judgement   ADL Overall ADL's : Needs assistance/impaired Eating/Feeding: Independent;Sitting   Grooming: Sitting;Set up   Upper Body Bathing: Set up;Sitting   Lower Body Bathing: Maximal assistance;Sit to/from stand   Upper Body Dressing : Set up;Sitting   Lower Body Dressing: Maximal assistance;Sit to/from stand   Toilet Transfer: Minimal assistance;Ambulation;BSC/3in1;Rolling walker (2 wheels)           Functional mobility during ADLs: Minimal assistance;+2 for safety/equipment;Rolling walker (2 wheels)       Vision Baseline Vision/History: 1 Wears glasses Ability to See in Adequate Light: 0 Adequate Patient Visual Report: No change from baseline       Perception         Praxis         Pertinent Vitals/Pain Pain Assessment Pain Assessment: Faces Faces Pain  Scale: Hurts little more Pain Location: L hip Pain Descriptors / Indicators: Operative site guarding, Sore Pain Intervention(s): Premedicated before session, Monitored during session, Repositioned, Ice applied     Extremity/Trunk Assessment Upper Extremity Assessment Upper Extremity Assessment: Overall WFL for tasks assessed   Lower Extremity Assessment Lower Extremity Assessment: Defer to PT evaluation   Cervical / Trunk Assessment Cervical / Trunk  Assessment: Normal   Communication Communication Communication: No apparent difficulties   Cognition Arousal: Alert Behavior During Therapy: WFL for tasks assessed/performed Overall Cognitive Status: Within Functional Limits for tasks assessed                                       General Comments       Exercises     Shoulder Instructions      Home Living Family/patient expects to be discharged to:: Private residence Living Arrangements: Alone Available Help at Discharge: Family;Available PRN/intermittently Type of Home: House Home Access: Stairs to enter [employer] of Steps: 2 Entrance Stairs-Rails: Right Home Layout: One level     Bathroom Shower/Tub: Producer, television/film/video: Handicapped height     Home Equipment: Agricultural consultant (2 wheels);Cane - single point;Grab bars - toilet;Grab bars - tub/shower;Hand held shower head;Shower seat;Other (comment);Adaptive equipment (lift chair) Adaptive Equipment: Reacher;Other (Comment) (bath brush on handle)        Prior Functioning/Environment Prior Level of Function : Independent/Modified Independent;Working/employed             Mobility Comments: walks with a cane ADLs Comments: sits to shower        OT Problem List: Decreased strength;Decreased activity tolerance;Impaired balance (sitting and/or standing);Decreased knowledge of use of DME or AE;Pain      OT Treatment/Interventions: Self-care/ADL training;Therapeutic activities;DME and/or AE instruction;Patient/family education;Balance training    OT Goals(Current goals can be found in the care plan section) Acute Rehab OT Goals OT Goal Formulation: With patient Time For Goal Achievement: 10/12/23 Potential to Achieve Goals: Good ADL Goals Pt Will Perform Grooming: with supervision;standing Pt Will Perform Lower Body Bathing: with min assist;with adaptive equipment;sit to/from stand Pt Will Perform Lower Body Dressing:  with min assist;with adaptive equipment;sit to/from stand Pt Will Transfer to Toilet: with supervision;ambulating;bedside commode Pt Will Perform Toileting - Clothing Manipulation and hygiene: with supervision;sit to/from stand Additional ADL Goal #1: Pt will complete bed mobility with min assist in preparation for ADLs.  OT Frequency: Min 1X/week    Co-evaluation PT/OT/SLP Co-Evaluation/Treatment: Yes Reason for Co-Treatment: For patient/therapist safety   OT goals addressed during session: ADL's and self-care      AM-PAC OT "6 Clicks" Daily Activity     Outcome Measure Help from another person eating meals?: None Help from another person taking care of personal grooming?: A Little Help from another person toileting, which includes using toliet, bedpan, or urinal?: A Lot Help from another person bathing (including washing, rinsing, drying)?: A Lot Help from another person to put on and taking off regular upper body clothing?: A Little Help from another person to put on and taking off regular lower body clothing?: A Lot 6 Click Score: 16   End of Session Equipment Utilized During Treatment: Gait belt;Rolling walker (2 wheels) Nurse Communication: Mobility status;Other (comment) (ok to take off O2)  Activity Tolerance: Patient tolerated treatment well Patient left: in chair;with call bell/phone within reach;with chair alarm set  OT Visit Diagnosis: Unsteadiness on  feet (R26.81);Other abnormalities of gait and mobility (R26.89);Pain;Muscle weakness (generalized) (M62.81) Pain - Right/Left: Left Pain - part of body: Hip                Time: 0930-1007 OT Time Calculation (min): 37 min Charges:  OT General Charges $OT Visit: 1 Visit OT Evaluation $OT Eval Moderate Complexity: 1 Mod  Berna Spare, OTR/L Acute Rehabilitation Services Office: [phone]   Evern Bio 09/28/2023, 10:19 AM

## 2023-09-28 NOTE — Evaluation (Signed)
Physical Therapy Evaluation Patient Details Name: Barbara [name] MRN: [id number] DOB: [birth year] Today's Date: 09/28/2023  History of Present Illness  Pt is an 82 year old woman admitted on 11/22 after falling on her steps sustaining a L subtrochanetric hip fx. Underwent L hemiarthroplasty on 11/23. PMH: arthritis, HLD, HTN, afib, CAD, CHF, GERTD, sleep apnea on CPAP, pacemaker, B shoulder surgeries.  Clinical Impression  Patient seen with OT for first time OOB and for scheduling due to pt wanting to watch her church service.  She presents with decreased mobility due to pain, limited strength and ROM of L LE, decreased balance, decreased activity tolerance and decreased safety awareness.  Previously living alone, driving and performing all IADL's.  She prefers home though cannot identify a caregiver currently.  Recommend inpatient rehab (<3 hours/day) prior to d/c home.         If plan is discharge home, recommend the following: A little help with walking and/or transfers;Assistance with cooking/housework;A little help with bathing/dressing/bathroom;Assist for transportation;Help with stairs or ramp for entrance   Can travel by private vehicle   Yes    Equipment Recommendations None recommended by PT  Recommendations for Other Services       Functional Status Assessment Patient has had a recent decline in their functional status and demonstrates the ability to make significant improvements in function in a reasonable and predictable amount of time.     Precautions / Restrictions Precautions Precautions: Fall Restrictions Weight Bearing Restrictions: Yes LLE Weight Bearing: Weight bearing as tolerated      Mobility  Bed Mobility Overal bed mobility: Needs Assistance Bed Mobility: Supine to Sit     Supine to sit: Mod assist, HOB elevated, Used rails     General bed mobility comments: cues for technique and to self assist, supported L LE and assisted hips to EOB with  bed pad    Transfers Overall transfer level: Needs assistance Equipment used: Rolling walker (2 wheels) Transfers: Sit to/from Stand Sit to Stand: Min assist, +2 safety/equipment           General transfer comment: cues to technique, assist to rise and steady, decreased control of descent to chair, pt is used to using a lift chair at home    Ambulation/Gait Ambulation/Gait assistance: Min assist, +2 safety/equipment Gait Distance (Feet): 10 Feet Assistive device: Rolling walker (2 wheels) Gait Pattern/deviations: Step-to pattern, Decreased stride length, Antalgic, Decreased stance time - left       General Gait Details: slow with cues for UE support for weight tolerance L LE in stance; assist for walker progression due to limited on her own, patient reported getting "hot" and clamy so sat after getting around foot of bed with OT following with chair  Stairs            Wheelchair Mobility     Tilt Bed    Modified Rankin (Stroke Patients Only)       Balance Overall balance assessment: Needs assistance Sitting-balance support: Feet supported Sitting balance-Leahy Scale: Good     Standing balance support: Bilateral upper extremity supported, Reliant on assistive device for balance Standing balance-Leahy Scale: Poor Standing balance comment: UE support for balance and pain management flexed posture in standing                             Pertinent Vitals/Pain Pain Assessment Pain Assessment: Faces Faces Pain Scale: Hurts little more Pain Location: L hip with mobility  Pain Descriptors / Indicators: Operative site guarding, Sore Pain Intervention(s): Monitored during session, Premedicated before session, Repositioned, Ice applied    Home Living Family/patient expects to be discharged to:: Private residence Living Arrangements: Alone Available Help at Discharge: Family;Available PRN/intermittently Type of Home: House Home Access: Stairs to  enter Entrance Stairs-Rails: Right Entrance Stairs-Number of Steps: 2   Home Layout: One level Home Equipment: Agricultural consultant (2 wheels);Cane - single point;Grab bars - toilet;Grab bars - tub/shower;Hand held shower head;Shower seat;Other (comment);Adaptive equipment      Prior Function Prior Level of Function : Independent/Modified Independent;Working/employed             Mobility Comments: walks with a cane ADLs Comments: sits to shower     Extremity/Trunk Assessment   Upper Extremity Assessment Upper Extremity Assessment: Defer to OT evaluation    Lower Extremity Assessment Lower Extremity Assessment: LLE deficits/detail LLE Deficits / Details: Ankle AROM WFL, knee flexion in supine to about 30 degrees and hip limited by pain about 70 in sitting; strength at least 3/5 knee extension    Cervical / Trunk Assessment Cervical / Trunk Assessment: Normal  Communication   Communication Communication: No apparent difficulties  Cognition Arousal: Alert Behavior During Therapy: WFL for tasks assessed/performed Overall Cognitive Status: Within Functional Limits for tasks assessed                                          General Comments General comments (skin integrity, edema, etc.): urinated in standing using purewick, obtained BSC though pt already going.  encouraged use of BSC while in recliner    Exercises Total Joint Exercises Ankle Circles/Pumps: AROM, 10 reps, Both, Supine Quad Sets: AROM, 5 reps, Supine, Both Heel Slides: AAROM, 5 reps, Left, Supine   Assessment/Plan    PT Assessment Patient needs continued PT services  PT Problem List Decreased strength;Decreased balance;Pain;Decreased mobility;Decreased activity tolerance;Decreased knowledge of use of DME       PT Treatment Interventions DME instruction;Therapeutic activities;Gait training;Therapeutic exercise;Patient/family education;Balance training;Functional mobility training    PT  Goals (Current goals can be found in the Care Plan section)  Acute Rehab PT Goals Patient Stated Goal: return home PT Goal Formulation: With patient Time For Goal Achievement: 10/12/23 Potential to Achieve Goals: Good    Frequency Min 1X/week     Co-evaluation PT/OT/SLP Co-Evaluation/Treatment: Yes Reason for Co-Treatment: For patient/therapist safety PT goals addressed during session: Mobility/safety with mobility;Balance;Proper use of DME OT goals addressed during session: ADL's and self-care       AM-PAC PT "6 Clicks" Mobility  Outcome Measure Help needed turning from your back to your side while in a flat bed without using bedrails?: A Little Help needed moving from lying on your back to sitting on the side of a flat bed without using bedrails?: A Lot Help needed moving to and from a bed to a chair (including a wheelchair)?: A Lot Help needed standing up from a chair using your arms (e.g., wheelchair or bedside chair)?: A Lot Help needed to walk in hospital room?: Total Help needed climbing 3-5 steps with a railing? : Total 6 Click Score: 11    End of Session Equipment Utilized During Treatment: Gait belt Activity Tolerance: Patient limited by pain Patient left: in chair;with call bell/phone within reach;with chair alarm set Nurse Communication: Mobility status PT Visit Diagnosis: Difficulty in walking, not elsewhere classified (R26.2);Pain;History of falling (  Z91.81) Pain - Right/Left: Left Pain - part of body: Hip    Time: 0930-1000 PT Time Calculation (min) (ACUTE ONLY): 30 min   Charges:   PT Evaluation $PT Eval Moderate Complexity: 1 Mod   PT General Charges $$ ACUTE PT VISIT: 1 Visit         Sheran Lawless, PT Acute Rehabilitation Services Office:[phone] 09/28/2023   Elray Mcgregor 09/28/2023, 11:23 AM

## 2023-09-28 NOTE — Progress Notes (Signed)
PROGRESS NOTE    Barbara [name]  WUJ:[id number] DOB: 24-Jan-1941 DOA: 09/26/2023 PCP: [name], MD   Brief Narrative:  This 82 y.o. female with PMH significant for CAD, diastolic CHF, A-fib on Eliquis, essential hypertension, hyperlipidemia, anxiety disorder, CKD stage IIIa presented in the ED status post mechanical fall with left hip pain.  Patient states she is fully functional, lives alone, she was looking at the dog and she missed the step and fell on her left side and has developed left hip pain.  She was unable to stand up.  She was on the floor for 20 minutes.  X-ray left hip Acute displaced left femoral neck fracture.  Orthopedic consulted.  Patient is s/p ORIF.  POD #1.  Assessment & Plan:   Principal Problem:   Closed left subtrochanteric femur fracture (HCC) Active Problems:   CAD (coronary artery disease/elevated troponin   CKD (chronic kidney disease), stage III (HCC)   PAF (paroxysmal atrial fibrillation) (HCC)   Essential hypertension   Hyperlipidemia   Gastroesophageal reflux disease   Osteoarthritis  Left femoral neck fracture status post mechanical fall: Patient tripped and fell on the left side while going downstairs and has developed left thigh pain. X-ray shows acute displaced left femoral neck fracture. Orthopedics consulted.  Status post ORIF POD #2. Adequate pain control. Continue IV hydration. PT and OT recommended SNF   Essential hypertension: Continue hydralazine 150 mg daily Continue Toprol 100 mg daily Continue Imdur 30 mg daily   Hyperlipidemia: Continue Crestor 40 mg daily   CAD: Continue aspirin, Crestor, Imdur.   Paroxysmal A-fib: Heart rate is well-controlled. Continue metoprolol Resume Eliquis.  Anxiety disorder Continue lorazepam 0.5 mg q6hr prn   GERD Continue pantoprazole 40 mg daily.   CKD stage IIIA: Serum creatinine at baseline.   Permanent pacemaker: Stable.  Continue follow-up outpatient    DVT  prophylaxis:  heparin sq Code Status: Full code Family Communication: No family at bd side Disposition Plan:   Status is: Inpatient Remains inpatient appropriate because: Admitted for left femoral neck fracture underwent ORIF.  POD 2     Consultants:  Orthopeadics  Procedures: s/p ORIF  Antimicrobials:  Anti-infectives (From admission, onward)    Start     Dose/Rate Route Frequency Ordered Stop   09/27/23 2300  ceFAZolin (ANCEF) IVPB 2g/100 mL premix        2 g 200 mL/hr over 30 Minutes Intravenous Every 12 hours 09/27/23 1441 09/27/23 2245   09/27/23 1345  ceFAZolin (ANCEF) IVPB 2g/100 mL premix  Status:  Discontinued        2 g 200 mL/hr over 30 Minutes Intravenous Every 6 hours 09/27/23 1344 09/27/23 1441   09/27/23 0930  ceFAZolin (ANCEF) IVPB 2g/100 mL premix        2 g 200 mL/hr over 30 Minutes Intravenous To [facility] 09/26/23 2041 09/27/23 1114   09/27/23 0859  ceFAZolin (ANCEF) 2-4 GM/100ML-% IVPB       Note to Pharmacy: Crissie Sickles: cabinet override      09/27/23 0859 09/27/23 1120       Subjective: Patient was seen and examined at bedside.  Overnight events noted.   Patient reports doing much better.  She reports pain is reasonably controlled now.  S/p ORIF POD 2.  Patient was sitting comfortably in the chair.  Had participated in physical therapy  Objective: Vitals:   09/27/23 2004 09/28/23 0118 09/28/23 0554 09/28/23 1236  BP: (!) 122/44 133/88 (!) 151/96 (!) 166/53  Pulse: 62 (!) 59 62   Resp: 16 18 16 17   Temp: 98.4 F (36.9 C) 98.2 F (36.8 C) 98.2 F (36.8 C) 98.4 F (36.9 C)  TempSrc: Oral Oral Oral   SpO2: 99% 99% 99%   Weight:      Height:        Intake/Output Summary (Last 24 hours) at 09/28/2023 1441 Last data filed at 09/28/2023 0120 Gross per 24 hour  Intake 325.76 ml  Output 1200 ml  Net -874.24 ml   Filed Weights   09/27/23 0910  Weight: 71 kg    Examination:  General exam: Appears calm and comfortable,  deconditioned, not in any acute distress. Respiratory system: Clear to auscultation. Respiratory effort normal. Cardiovascular system: S1 & S2 heard, RRR. No JVD, murmurs, rubs, gallops or clicks. No pedal edema. Gastrointestinal system: Abdomen is nondistended, soft and nontender. Normal bowel sounds heard. Central nervous system: Alert and oriented x 3. No focal neurological deficits. Extremities:  s/p ORIF POD # 2, tenderness + Skin: No rashes, lesions or ulcers Psychiatry: Judgement and insight appear normal. Mood & affect appropriate.     Data Reviewed: I have personally reviewed following labs and imaging studies  CBC: Recent Labs  Lab 09/26/23 1248 09/27/23 0519  WBC 5.3 7.4  NEUTROABS 4.2  --   HGB 10.9* 10.4*  HCT 35.7* 33.4*  MCV 87.7 86.8  PLT 183 179   Basic Metabolic Panel: Recent Labs  Lab 09/26/23 1248 09/27/23 0519  NA 139 138  K 3.4* 3.7  CL 103 105  CO2 24 21*  GLUCOSE 100* 84  BUN 17 22  CREATININE 1.53* 1.58*  CALCIUM 9.8 9.9  MG  --  1.8  PHOS  --  3.4   GFR: Estimated Creatinine Clearance: 25.8 mL/min (A) (by C-G formula based on SCr of 1.58 mg/dL (H)). Liver Function Tests: No results for input(s): "AST", "ALT", "ALKPHOS", "BILITOT", "PROT", "ALBUMIN" in the last 168 hours. No results for input(s): "LIPASE", "AMYLASE" in the last 168 hours. No results for input(s): "AMMONIA" in the last 168 hours. Coagulation Profile: Recent Labs  Lab 09/26/23 1248  INR 1.3*   Cardiac Enzymes: No results for input(s): "CKTOTAL", "CKMB", "CKMBINDEX", "TROPONINI" in the last 168 hours. BNP (last 3 results) No results for input(s): "PROBNP" in the last 8760 hours. HbA1C: No results for input(s): "HGBA1C" in the last 72 hours. CBG: Recent Labs  Lab 09/26/23 1245  GLUCAP 91   Lipid Profile: No results for input(s): "CHOL", "HDL", "LDLCALC", "TRIG", "CHOLHDL", "LDLDIRECT" in the last 72 hours. Thyroid Function Tests: No results for input(s): "TSH",  "T4TOTAL", "FREET4", "T3FREE", "THYROIDAB" in the last 72 hours. Anemia Panel: No results for input(s): "VITAMINB12", "FOLATE", "FERRITIN", "TIBC", "IRON", "RETICCTPCT" in the last 72 hours. Sepsis Labs: No results for input(s): "PROCALCITON", "LATICACIDVEN" in the last 168 hours.  Recent Results (from the past 240 hour(s))  Surgical pcr screen     Status: None   Collection Time: 09/26/23  9:17 PM   Specimen: Nasal Mucosa; Nasal Swab  Result Value Ref Range Status   MRSA, PCR NEGATIVE NEGATIVE Final   Staphylococcus aureus NEGATIVE NEGATIVE Final    Comment: (NOTE) The Xpert SA Assay (FDA approved for NASAL specimens in patients 65 years of age and older), is one component of a comprehensive surveillance program. It is not intended to diagnose infection nor to guide or monitor treatment. Performed at [facility], [address], [place], [place] [address]    Radiology  Studies: No results found.   Scheduled Meds:  apixaban  2.5 mg Oral BID   docusate sodium  100 mg Oral BID   docusate sodium  100 mg Oral BID   hydrALAZINE  150 mg Oral BID   isosorbide mononitrate  30 mg Oral Daily   metoprolol succinate  100 mg Oral QPM   pantoprazole  40 mg Oral Daily   rosuvastatin  40 mg Oral Daily   senna  1 tablet Oral BID   Continuous Infusions:     LOS: 2 days    Time spent: 35 Mins    Willeen Niece, MD [facility]   If 7PM-7AM, please contact night-coverage

## 2023-09-28 NOTE — Progress Notes (Signed)
Subjective: Patient reports pain as moderate.  Tolerating diet.  Urinating.   No CP, SOB.  Has mobilized some OOB with PT/OT. Eager to progress.  Objective:   VITALS:   Vitals:   09/27/23 2004 09/28/23 0118 09/28/23 0554 09/28/23 1236  BP: (!) 122/44 133/88 (!) 151/96 (!) 166/53  Pulse: 62 (!) 59 62   Resp: 16 18 16 17   Temp: 98.4 F (36.9 C) 98.2 F (36.8 C) 98.2 F (36.8 C) 98.4 F (36.9 C)  TempSrc: Oral Oral Oral   SpO2: 99% 99% 99%   Weight:      Height:          Latest Ref Rng & Units 09/27/2023    5:19 AM 09/26/2023   12:48 PM 08/07/2022    2:10 AM  CBC  WBC 4.0 - 10.5 K/uL 7.4  5.3  5.8   Hemoglobin 12.0 - 15.0 g/dL 19.1  47.8  29.5   Hematocrit 36.0 - 46.0 % 33.4  35.7  35.5   Platelets 150 - 400 K/uL 179  183  197       Latest Ref Rng & Units 09/27/2023    5:19 AM 09/26/2023   12:48 PM 08/13/2022    2:48 PM  BMP  Glucose 70 - 99 mg/dL 84  621  308   BUN 8 - 23 mg/dL 22  17  25    Creatinine 0.44 - 1.00 mg/dL 6.57  8.46  9.62   Sodium 135 - 145 mmol/L 138  139  139   Potassium 3.5 - 5.1 mmol/L 3.7  3.4  3.4   Chloride 98 - 111 mmol/L 105  103  103   CO2 22 - 32 mmol/L 21  24  28    Calcium 8.9 - 10.3 mg/dL 9.9  9.8  95.2    Intake/Output      11/23 0701 11/24 0700 11/24 0701 11/25 0700   P.O. 240    I.V. (mL/kg) 1229.1 (17.3)    IV Piggyback 66.4    Total Intake(mL/kg) 1535.4 (21.6)    Urine (mL/kg/hr) 1200 (0.7)    Blood 100    Total Output 1300    Net +235.4         Urine Occurrence 1 x 1 x      Physical Exam: General: NAD.  Sitting up in bed, calm, comfortable Resp: No increased wob Cardio: regular rate and rhythm ABD soft Neurologically intact MSK Neurovascularly intact Sensation intact distally Intact pulses distally Dorsiflexion/Plantar flexion intact Incision: dressing C/D/I   Assessment: 1 Day Post-Op  S/P Procedure(s) (LRB): ARTHROPLASTY BIPOLAR HIP (HEMIARTHROPLASTY) (Left) by Dr. Jewel Baize. Eulah Pont on  09/27/23  Principal Problem:   Closed left subtrochanteric femur fracture (HCC) Active Problems:   Essential hypertension   CAD (coronary artery disease/elevated troponin   Osteoarthritis   Gastroesophageal reflux disease   PAF (paroxysmal atrial fibrillation) (HCC)   CKD (chronic kidney disease), stage III (HCC)   Hyperlipidemia   Plan:  Advance diet Up with therapy Incentive Spirometry Elevate and Apply ice  Weightbearing: WBAT LLE, no posterior hip precautions Insicional and dressing care: Dressings left intact until follow-up and Reinforce dressings as needed Orthopedic device(s): None Showering: Keep dressing dry VTE prophylaxis:  on Eliquis 2.5mg  bid  , SCDs, ambulation Pain control: PRN, limit narcotics as able Follow - up plan: 2 weeks Contact information:  Margarita Rana MD, Levester Fresh PA-C  Dispo:  PT/OT recommending SNF but patient apprehensive. No one to care for her if  she went home though.     Jenne Pane, PA-C Office [phone] 09/28/2023, 1:17 PM

## 2023-09-29 ENCOUNTER — Encounter (HOSPITAL_COMMUNITY): Payer: Self-pay | Admitting: Orthopedic Surgery

## 2023-09-29 DIAGNOSIS — S7222XD Displaced subtrochanteric fracture of left femur, subsequent encounter for closed fracture with routine healing: Secondary | ICD-10-CM | POA: Diagnosis not present

## 2023-09-29 NOTE — Progress Notes (Signed)
Subjective: Patient reports pain as moderate. Feels a little worse today than yesterday. Throat feels sore. Tolerating diet.  Urinating.   No CP, SOB.  Has mobilized some OOB with PT/OT.  Objective:   VITALS:   Vitals:   09/28/23 2129 09/28/23 2153 09/29/23 0600 09/29/23 0803  BP: (!) 130/51 (!) 130/51 (!) 138/53 (!) 160/66  Pulse: 60  60 (!) 59  Resp:   18 15  Temp: 97.9 F (36.6 C)  98 F (36.7 C) 98.6 F (37 C)  TempSrc: Oral  Oral Oral  SpO2: 96%  98% 96%  Weight:      Height:          Latest Ref Rng & Units 09/27/2023    5:19 AM 09/26/2023   12:48 PM 08/07/2022    2:10 AM  CBC  WBC 4.0 - 10.5 K/uL 7.4  5.3  5.8   Hemoglobin 12.0 - 15.0 g/dL 91.4  78.2  95.6   Hematocrit 36.0 - 46.0 % 33.4  35.7  35.5   Platelets 150 - 400 K/uL 179  183  197       Latest Ref Rng & Units 09/27/2023    5:19 AM 09/26/2023   12:48 PM 08/13/2022    2:48 PM  BMP  Glucose 70 - 99 mg/dL 84  213  086   BUN 8 - 23 mg/dL 22  17  25    Creatinine 0.44 - 1.00 mg/dL 5.78  4.69  6.29   Sodium 135 - 145 mmol/L 138  139  139   Potassium 3.5 - 5.1 mmol/L 3.7  3.4  3.4   Chloride 98 - 111 mmol/L 105  103  103   CO2 22 - 32 mmol/L 21  24  28    Calcium 8.9 - 10.3 mg/dL 9.9  9.8  52.8    Intake/Output      11/24 0701 11/25 0700 11/25 0701 11/26 0700   P.O.  240   I.V. (mL/kg)     IV Piggyback     Total Intake(mL/kg)  240 (3.4)   Urine (mL/kg/hr) 800 (0.5)    Blood     Total Output 800    Net -800 +240        Urine Occurrence 1 x       Physical Exam: General: NAD.  Sitting up in bed, calm, comfortable, voice hoarse Resp: No increased wob Cardio: regular rate and rhythm ABD soft Neurologically intact MSK Neurovascularly intact Sensation intact distally Intact pulses distally Dorsiflexion/Plantar flexion intact Incision: dressing C/D/I   Assessment: 2 Days Post-Op  S/P Procedure(s) (LRB): ARTHROPLASTY BIPOLAR HIP (HEMIARTHROPLASTY) (Left) by Dr. Jewel Baize. Eulah Pont on  09/27/23  Principal Problem:   Closed left subtrochanteric femur fracture (HCC) Active Problems:   Essential hypertension   CAD (coronary artery disease/elevated troponin   Osteoarthritis   Gastroesophageal reflux disease   PAF (paroxysmal atrial fibrillation) (HCC)   CKD (chronic kidney disease), stage III (HCC)   Hyperlipidemia   Plan:  Advance diet Up with therapy Incentive Spirometry Elevate and Apply ice  Weightbearing: WBAT LLE, no posterior hip precautions Insicional and dressing care: Dressings left intact until follow-up and Reinforce dressings as needed Orthopedic device(s): None Showering: Keep dressing dry VTE prophylaxis:  on Eliquis 2.5mg  bid  , SCDs, ambulation Pain control: PRN, limit narcotics as able Follow - up plan: 2 weeks Contact information:  Margarita Rana MD, Levester Fresh PA-C  Dispo:  PT/OT recommending SNF. Ok to discharge from ortho standpoint  when medically ready.     Jenne Pane, PA-C Office [phone] 09/29/2023, 2:37 PM

## 2023-09-29 NOTE — Plan of Care (Signed)
  Problem: Education: Goal: Knowledge of General Education information will improve Description: Including pain rating scale, medication(s)/side effects and non-pharmacologic comfort measures Outcome: Not Progressing   Problem: Health Behavior/Discharge Planning: Goal: Ability to manage health-related needs will improve Outcome: Not Progressing   Problem: Clinical Measurements: Goal: Ability to maintain clinical measurements within normal limits will improve Outcome: Not Progressing Goal: Will remain free from infection Outcome: Not Progressing Goal: Diagnostic test results will improve Outcome: Not Progressing Goal: Respiratory complications will improve Outcome: Not Progressing Goal: Cardiovascular complication will be avoided Outcome: Not Progressing   Problem: Activity: Goal: Risk for activity intolerance will decrease Outcome: Not Progressing   Problem: Nutrition: Goal: Adequate nutrition will be maintained Outcome: Not Progressing   Problem: Coping: Goal: Level of anxiety will decrease Outcome: Not Progressing   Problem: Elimination: Goal: Will not experience complications related to bowel motility Outcome: Not Progressing Goal: Will not experience complications related to urinary retention Outcome: Not Progressing   Problem: Pain Management: Goal: General experience of comfort will improve Outcome: Not Progressing   Problem: Safety: Goal: Ability to remain free from injury will improve Outcome: Not Progressing   Problem: Skin Integrity: Goal: Risk for impaired skin integrity will decrease Outcome: Not Progressing   Problem: Education: Goal: Verbalization of understanding the information provided (i.e., activity precautions, restrictions, etc) will improve Outcome: Not Progressing Goal: Individualized Educational Video(s) Outcome: Not Progressing   Problem: Activity: Goal: Ability to ambulate and perform ADLs will improve Outcome: Not Progressing    Problem: Clinical Measurements: Goal: Postoperative complications will be avoided or minimized Outcome: Not Progressing   Problem: Self-Concept: Goal: Ability to maintain and perform role responsibilities to the fullest extent possible will improve Outcome: Not Progressing   Problem: Pain Management: Goal: Pain level will decrease Outcome: Not Progressing

## 2023-09-29 NOTE — Progress Notes (Signed)
Physical Therapy Treatment Patient Details Name: Barbara [name] MRN: [id number] DOB: 1941/07/25 Today's Date: 09/29/2023   History of Present Illness Pt is an 82 year old woman admitted on 11/22 after falling on her steps sustaining a L subtrochanetric hip fx. Underwent L hemiarthroplasty on 11/23. PMH: arthritis, HLD, HTN, afib, CAD, CHF, GERTD, sleep apnea on CPAP, pacemaker, B shoulder surgeries.    PT Comments  Pt agreeable to participate and is progressing slowly towards her physical therapy goals. Initiated session with ROM/isometric strengthening exercises. Pt requiring maximal assist for bed mobility and moderate assist for transfers. Unable to ambulate. Patient will benefit from continued inpatient follow up therapy, <3 hours/day.    If plan is discharge home, recommend the following: A little help with walking and/or transfers;Assistance with cooking/housework;A little help with bathing/dressing/bathroom;Assist for transportation;Help with stairs or ramp for entrance   Can travel by private vehicle     Yes  Equipment Recommendations  None recommended by PT    Recommendations for Other Services       Precautions / Restrictions Precautions Precautions: Fall Restrictions Weight Bearing Restrictions: Yes LLE Weight Bearing: Weight bearing as tolerated     Mobility  Bed Mobility Overal bed mobility: Needs Assistance Bed Mobility: Supine to Sit     Supine to sit: Max assist     General bed mobility comments: Cues provided for bridging through RLE to scoot hips over to left, however, pt unable to achieve and PT used bed pad to slide hips into position to then transition to left side of bed with assist for LLE and trunk to upright. Verbal cues for use of bed rail    Transfers Overall transfer level: Needs assistance Equipment used: Rolling walker (2 wheels) Transfers: Sit to/from Stand, Bed to chair/wheelchair/BSC Sit to Stand: Mod assist Stand pivot  transfers: Min assist         General transfer comment: Heavy modA to transition to standing, cues for rocking to gain momentum, pivoting over to [facility] and then chair with minA. Cues for sequencing/technique    Ambulation/Gait                   Stairs             Wheelchair Mobility     Tilt Bed    Modified Rankin (Stroke Patients Only)       Balance Overall balance assessment: Needs assistance Sitting-balance support: Feet supported Sitting balance-Leahy Scale: Good     Standing balance support: Bilateral upper extremity supported, Reliant on assistive device for balance Standing balance-Leahy Scale: Poor                              Cognition Arousal: Alert Behavior During Therapy: WFL for tasks assessed/performed Overall Cognitive Status: Within Functional Limits for tasks assessed                                          Exercises General Exercises - Lower Extremity Quad Sets: Both, 10 reps, Supine Heel Slides: AAROM, Left, 5 reps, Supine Hip ABduction/ADduction: AAROM, Left, 5 reps, Supine    General Comments        Pertinent Vitals/Pain Pain Assessment Pain Assessment: Faces Faces Pain Scale: Hurts even more Pain Location: L hip with mobility Pain Descriptors / Indicators: Operative site guarding, Sore Pain Intervention(s): Limited  activity within patient's tolerance, Monitored during session    Home Living                          Prior Function            PT Goals (current goals can now be found in the care plan section) Acute Rehab PT Goals Patient Stated Goal: return home Potential to Achieve Goals: Good    Frequency    Min 1X/week      PT Plan      Co-evaluation              AM-PAC PT "6 Clicks" Mobility   Outcome Measure  Help needed turning from your back to your side while in a flat bed without using bedrails?: A Little Help needed moving from lying on your back  to sitting on the side of a flat bed without using bedrails?: A Lot Help needed moving to and from a bed to a chair (including a wheelchair)?: A Lot Help needed standing up from a chair using your arms (e.g., wheelchair or bedside chair)?: A Lot Help needed to walk in hospital room?: Total Help needed climbing 3-5 steps with a railing? : Total 6 Click Score: 11    End of Session Equipment Utilized During Treatment: Gait belt Activity Tolerance: Patient limited by fatigue Patient left: in chair;with call bell/phone within reach;with chair alarm set Nurse Communication: Mobility status PT Visit Diagnosis: Difficulty in walking, not elsewhere classified (R26.2);Pain;History of falling (Z91.81) Pain - Right/Left: Left Pain - part of body: Hip     Time: 2841-3244 PT Time Calculation (min) (ACUTE ONLY): 49 min  Charges:    $Therapeutic Activity: 38-52 mins PT General Charges $$ ACUTE PT VISIT: 1 Visit                     Barbara [name], PT, DPT Acute Rehabilitation Services Office [phone]    Barbara [name] 09/29/2023, 11:12 AM

## 2023-09-29 NOTE — Progress Notes (Signed)
PROGRESS NOTE    CARMAN BUWALDA  HYQ:[id number] DOB: 24-Jul-1941 DOA: 09/26/2023 PCP: [name], MD   Brief Narrative:  This 82 y.o. female with PMH significant for CAD, diastolic CHF, A-fib on Eliquis, essential hypertension, hyperlipidemia, anxiety disorder, CKD stage IIIa presented in the ED status post mechanical fall with left hip pain.  Patient states she is fully functional, lives alone, she was looking at the dog and she missed the step and fell on her left side and has developed left hip pain.  She was unable to stand up.  She was on the floor for 20 minutes.  X-ray left hip Acute displaced left femoral neck fracture.  Orthopedic consulted.  Patient is s/p ORIF.  POD #1.  Assessment & Plan:   Principal Problem:   Closed left subtrochanteric femur fracture (HCC) Active Problems:   CAD (coronary artery disease/elevated troponin   CKD (chronic kidney disease), stage III (HCC)   PAF (paroxysmal atrial fibrillation) (HCC)   Essential hypertension   Hyperlipidemia   Gastroesophageal reflux disease   Osteoarthritis  Left femoral neck fracture status post mechanical fall: Patient tripped and fell on the left side while going downstairs and has developed left thigh pain. X-ray shows acute displaced left femoral neck fracture. Orthopedics consulted.  Status post ORIF POD # 3. Adequate pain control. Continue IV hydration. PT and OT recommended SNF   Essential hypertension: Continue hydralazine 150 mg daily Continue Toprol 100 mg daily Continue Imdur 30 mg daily   Hyperlipidemia: Continue Crestor 40 mg daily   CAD: Continue aspirin, Crestor, Imdur.   Paroxysmal A-fib: Heart rate is well-controlled. Continue metoprolol Resume Eliquis.  Anxiety disorder Continue lorazepam 0.5 mg q6hr prn   GERD Continue pantoprazole 40 mg daily.   CKD stage IIIA: Serum creatinine at baseline.   Permanent pacemaker: Stable.  Continue follow-up outpatient    DVT  prophylaxis:  heparin sq Code Status: Full code Family Communication: No family at bd side Disposition Plan:   Status is: Inpatient Remains inpatient appropriate because: Admitted for left femoral neck fracture underwent ORIF.  POD 3,  PT recommended SNF.  TOC aware.    Consultants:  Orthopeadics  Procedures: s/p ORIF  Antimicrobials:  Anti-infectives (From admission, onward)    Start     Dose/Rate Route Frequency Ordered Stop   09/27/23 2300  ceFAZolin (ANCEF) IVPB 2g/100 mL premix        2 g 200 mL/hr over 30 Minutes Intravenous Every 12 hours 09/27/23 1441 09/27/23 2245   09/27/23 1345  ceFAZolin (ANCEF) IVPB 2g/100 mL premix  Status:  Discontinued        2 g 200 mL/hr over 30 Minutes Intravenous Every 6 hours 09/27/23 1344 09/27/23 1441   09/27/23 0930  ceFAZolin (ANCEF) IVPB 2g/100 mL premix        2 g 200 mL/hr over 30 Minutes Intravenous To [facility]Stay [facility] 09/26/23 2041 09/27/23 1114   09/27/23 0859  ceFAZolin (ANCEF) 2-4 GM/100ML-% IVPB       Note to Pharmacy: Crissie Sickles: cabinet override      09/27/23 0859 09/27/23 1120       Subjective: Patient was seen and examined at bedside.  Overnight events noted.   Patient reports doing much better.  She reports pain is reasonably controlled now.  S/p ORIF POD 3.  Patient was sitting comfortably in the chair.  Had participated in physical therapy  Objective: Vitals:   09/28/23 2153 09/29/23 0600 09/29/23 0803 09/29/23 1443  BP: Marland Kitchen)  130/51 (!) 138/53 (!) 160/66 (!) 132/57  Pulse:  60 (!) 59 (!) 59  Resp:  18 15 17   Temp:  98 F (36.7 C) 98.6 F (37 C) 98.6 F (37 C)  TempSrc:  Oral Oral Oral  SpO2:  98% 96% 96%  Weight:      Height:        Intake/Output Summary (Last 24 hours) at 09/29/2023 1524 Last data filed at 09/29/2023 0900 Gross per 24 hour  Intake 240 ml  Output 800 ml  Net -560 ml   Filed Weights   09/27/23 0910  Weight: 71 kg    Examination:  General exam: Appears calm and  comfortable, deconditioned, not in any acute distress. Respiratory system: Clear to auscultation. Respiratory effort normal. Cardiovascular system: S1 & S2 heard, RRR. No JVD, murmurs, rubs, gallops or clicks. No pedal edema. Gastrointestinal system: Abdomen is nondistended, soft and nontender. Normal bowel sounds heard. Central nervous system: Alert and oriented x 3. No focal neurological deficits. Extremities:  s/p ORIF POD # 3 , tenderness + Skin: No rashes, lesions or ulcers Psychiatry: Judgement and insight appear normal. Mood & affect appropriate.     Data Reviewed: I have personally reviewed following labs and imaging studies  CBC: Recent Labs  Lab 09/26/23 1248 09/27/23 0519  WBC 5.3 7.4  NEUTROABS 4.2  --   HGB 10.9* 10.4*  HCT 35.7* 33.4*  MCV 87.7 86.8  PLT 183 179   Basic Metabolic Panel: Recent Labs  Lab 09/26/23 1248 09/27/23 0519  NA 139 138  K 3.4* 3.7  CL 103 105  CO2 24 21*  GLUCOSE 100* 84  BUN 17 22  CREATININE 1.53* 1.58*  CALCIUM 9.8 9.9  MG  --  1.8  PHOS  --  3.4   GFR: Estimated Creatinine Clearance: 25.8 mL/min (A) (by C-G formula based on SCr of 1.58 mg/dL (H)). Liver Function Tests: No results for input(s): "AST", "ALT", "ALKPHOS", "BILITOT", "PROT", "ALBUMIN" in the last 168 hours. No results for input(s): "LIPASE", "AMYLASE" in the last 168 hours. No results for input(s): "AMMONIA" in the last 168 hours. Coagulation Profile: Recent Labs  Lab 09/26/23 1248  INR 1.3*   Cardiac Enzymes: No results for input(s): "CKTOTAL", "CKMB", "CKMBINDEX", "TROPONINI" in the last 168 hours. BNP (last 3 results) No results for input(s): "PROBNP" in the last 8760 hours. HbA1C: No results for input(s): "HGBA1C" in the last 72 hours. CBG: Recent Labs  Lab 09/26/23 1245  GLUCAP 91   Lipid Profile: No results for input(s): "CHOL", "HDL", "LDLCALC", "TRIG", "CHOLHDL", "LDLDIRECT" in the last 72 hours. Thyroid Function Tests: No results for  input(s): "TSH", "T4TOTAL", "FREET4", "T3FREE", "THYROIDAB" in the last 72 hours. Anemia Panel: No results for input(s): "VITAMINB12", "FOLATE", "FERRITIN", "TIBC", "IRON", "RETICCTPCT" in the last 72 hours. Sepsis Labs: No results for input(s): "PROCALCITON", "LATICACIDVEN" in the last 168 hours.  Recent Results (from the past 240 hour(s))  Surgical pcr screen     Status: None   Collection Time: 09/26/23  9:17 PM   Specimen: Nasal Mucosa; Nasal Swab  Result Value Ref Range Status   MRSA, PCR NEGATIVE NEGATIVE Final   Staphylococcus aureus NEGATIVE NEGATIVE Final    Comment: (NOTE) The Xpert SA Assay (FDA approved for NASAL specimens in patients 39 years of age and older), is one component of a comprehensive surveillance program. It is not intended to diagnose infection nor to guide or monitor treatment. Performed at [facility], [address],  [place], [place] [address]    Radiology Studies: No results found.   Scheduled Meds:  apixaban  2.5 mg Oral BID   docusate sodium  100 mg Oral BID   docusate sodium  100 mg Oral BID   hydrALAZINE  150 mg Oral BID   isosorbide mononitrate  30 mg Oral Daily   metoprolol succinate  100 mg Oral QPM   pantoprazole  40 mg Oral Daily   rosuvastatin  40 mg Oral Daily   senna  1 tablet Oral BID   Continuous Infusions:     LOS: 3 days    Time spent: 35 Mins    Willeen Niece, MD [facility]   If 7PM-7AM, please contact night-coverage

## 2023-09-29 NOTE — NC FL2 (Signed)
[place] MEDICAID FL2 LEVEL OF CARE FORM     IDENTIFICATION  Patient Name: Barbara [name] Birthdate: 04-14-1941 Sex: female Admission Date (Current Location): 09/26/2023  [facility] and [place] Number:  Producer, television/film/video and Address:  [facility], [address] [place], [place] [address]      Provider Number: [id number]  Attending Physician Name and Address:  Willeen Niece, MD  Relative Name and Phone Number:  Zinger,[name] Sister   [phone]    Current Level of Care: Hospital Recommended Level of Care: Skilled Nursing Facility Prior Approval Number:    Date Approved/Denied:   PASRR Number:    Discharge Plan: SNF    Current Diagnoses: Patient Active Problem List   Diagnosis Date Noted   Closed left subtrochanteric femur fracture (HCC) 09/26/2023   Acute on chronic diastolic (congestive) heart failure (HCC) 08/04/2022   Acute hypoxemic respiratory failure (HCC) 08/04/2022   Bronchitis 08/04/2022   Ureteral stone 06/26/2022   Renal stones 06/26/2022   S/P cystoscopy 06/25/2022   Nephrolithiasis, 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, 5mm in size, leading to moderate left hydroureteronephrosis and significant left perinephric stranding /edema 06/09/2022   OSA (obstructive sleep apnea) 06/09/2022   Tachycardia-bradycardia syndrome (HCC)    Acquired trigger finger of right index finger 05/10/2021   Trigger thumb of left hand 05/10/2021   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   Atrial fibrillation (HCC) 10/08/2020   Persistent atrial fibrillation (HCC) 10/07/2020   CKD (chronic kidney disease), stage III (HCC) 05/20/2020   Apical variant hypertrophic cardiomyopathy (HCC)    Chest pain of uncertain etiology    Chest pain    S/P reverse total shoulder arthroplasty, 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   Encounter for orthopedic follow-up care 02/18/2020   PAF (paroxysmal atrial fibrillation) (HCC) 02/28/2019    Demand myocardial infarction (HCC) 02/25/2019   Pain in left knee 01/06/2018   Dysphonia 12/29/2017   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   Acute on chronic diastolic CHF (congestive heart failure) (HCC) 11/16/2017   Gastroesophageal reflux disease 11/06/2017   Bradycardia 12/27/2015   Exertional dyspnea 12/23/2014   Sinus bradycardia 09/13/2014   LUMBAGO 05/03/2010   VERTIGO 01/21/2009   TEMPOROMANDIBULAR JOINT PAIN 08/31/2008   Osteoarthritis 05/31/2008   TROCHANTERIC BURSITIS 05/31/2008   Essential hypertension 04/06/2007   CAD (coronary artery disease/elevated troponin 04/06/2007   ALLERGIC RHINITIS 04/06/2007   History of colonic polyps 04/06/2007   Hyperlipidemia 04/06/2007    Orientation RESPIRATION BLADDER Height & Weight     Self, Time, Situation, Place  Normal Incontinent, External catheter Weight: 156 lb 9.6 oz (71 kg) Height:  5' 7.01" (170.2 cm)  BEHAVIORAL SYMPTOMS/MOOD NEUROLOGICAL BOWEL NUTRITION STATUS      Continent Diet (see discharge summary)  AMBULATORY STATUS COMMUNICATION OF NEEDS Skin   Total Care Verbally Surgical wounds                       Personal Care Assistance Level of Assistance  Bathing, Feeding, Dressing Bathing Assistance: Maximum assistance Feeding assistance: Independent Dressing Assistance: Maximum assistance     Functional Limitations Info  Sight, Hearing, Speech Sight Info: Adequate Hearing Info: Adequate Speech Info: Adequate    SPECIAL CARE FACTORS FREQUENCY  PT (By licensed PT), OT (By licensed OT)     PT Frequency: 5x week OT Frequency: 5x week            Contractures Contractures Info: Not present  Additional Factors Info  Code Status, Allergies Code Status Info: full Allergies Info: Lidocaine, Morphine, Procaine Hcl, Sulfonamide Derivatives, Amlodipine, Norco (Hydrocodone-acetaminophen), Tramadol, Vytorin (Ezetimibe-simvastatin), Tape           Current Medications (09/29/2023):  This is the  current hospital active medication list Current Facility-Administered Medications  Medication Dose Route Frequency Provider Last Rate Last Admin   acetaminophen (TYLENOL) tablet 325-650 mg  325-650 mg Oral Q6H PRN Jenne Pane, PA-C   650 mg at 09/29/23 1025   alum & mag hydroxide-simeth (MAALOX/MYLANTA) 200-200-20 MG/5ML suspension 30 mL  30 mL Oral Q4H PRN Gawne, Meghan M, PA-C       apixaban Everlene Balls) tablet 2.5 mg  2.5 mg Oral BID Willeen Niece, MD   2.5 mg at 09/29/23 1023   bisacodyl (DULCOLAX) suppository 10 mg  10 mg Rectal Daily PRN Gawne, Meghan M, PA-C       docusate sodium (COLACE) capsule 100 mg  100 mg Oral BID Gawne, Meghan M, PA-C   100 mg at 09/28/23 2154   docusate sodium (COLACE) capsule 100 mg  100 mg Oral BID Levester Fresh M, PA-C   100 mg at 09/29/23 1023   hydrALAZINE (APRESOLINE) tablet 150 mg  150 mg Oral BID Levester Fresh M, PA-C   150 mg at 09/29/23 1023   HYDROmorphone (DILAUDID) injection 0.5 mg  0.5 mg Intravenous Q4H PRN Gawne, Meghan M, PA-C       isosorbide mononitrate (IMDUR) 24 hr tablet 30 mg  30 mg Oral Daily Levester Fresh M, PA-C   30 mg at 09/29/23 1023   LORazepam (ATIVAN) tablet 0.5 mg  0.5 mg Oral Q6H PRN Levester Fresh M, PA-C   0.5 mg at 09/26/23 2101   menthol-cetylpyridinium (CEPACOL) lozenge 3 mg  1 lozenge Oral PRN Levester Fresh M, PA-C       Or   phenol (CHLORASEPTIC) mouth spray 1 spray  1 spray Mouth/Throat PRN Gawne, Lindie Spruce M, PA-C       methocarbamol (ROBAXIN) tablet 500 mg  500 mg Oral Q6H PRN Jenne Pane, PA-C   500 mg at 09/28/23 1610   Or   methocarbamol (ROBAXIN) injection 500 mg  500 mg Intravenous Q6H PRN Gawne, Meghan M, PA-C       metoCLOPramide (REGLAN) tablet 5-10 mg  5-10 mg Oral Q8H PRN Gawne, Meghan M, PA-C       Or   metoCLOPramide (REGLAN) injection 5-10 mg  5-10 mg Intravenous Q8H PRN Gawne, Meghan M, PA-C       metoprolol succinate (TOPROL-XL) 24 hr tablet 100 mg  100 mg Oral QPM Levester Fresh M, PA-C   100 mg at  09/28/23 1715   nitroGLYCERIN (NITROSTAT) SL tablet 0.4 mg  0.4 mg Sublingual Q5 min PRN Gawne, Lindie Spruce M, PA-C       ondansetron [facility]) tablet 4 mg  4 mg Oral Q6H PRN Gawne, Meghan M, PA-C       Or   ondansetron [facility]) injection 4 mg  4 mg Intravenous Q6H PRN Gawne, Meghan M, PA-C       Oral care mouth rinse  15 mL Mouth Rinse PRN Aleen Campi, Meghan M, PA-C       oxyCODONE (Oxy IR/ROXICODONE) immediate release tablet 2.5 mg  2.5 mg Oral Q4H PRN Aleen Campi, Meghan M, PA-C       oxyCODONE (Oxy IR/ROXICODONE) immediate release tablet 5 mg  5 mg Oral Q4H PRN Jenne Pane, PA-C   5 mg at 09/28/23 [phone]  pantoprazole (PROTONIX) EC tablet 40 mg  40 mg Oral Daily Levester Fresh M, PA-C   40 mg at 09/29/23 1023   polyethylene glycol (MIRALAX / GLYCOLAX) packet 17 g  17 g Oral Daily PRN Gawne, Meghan M, PA-C       rosuvastatin (CRESTOR) tablet 40 mg  40 mg Oral Daily Levester Fresh M, PA-C   40 mg at 09/29/23 1023   senna (SENOKOT) tablet 8.6 mg  1 tablet Oral BID Levester Fresh M, PA-C   8.6 mg at 09/29/23 1023     Discharge Medications: Please see discharge summary for a list of discharge medications.  Relevant Imaging Results:  Relevant Lab Results:   Additional Information SSN: [id number]  Lorri Frederick, LCSW

## 2023-09-29 NOTE — Addendum Note (Signed)
Addendum  created 09/29/23 1255 by Lewie Loron, MD   Intraprocedure Event edited, Intraprocedure Meds edited

## 2023-09-29 NOTE — Progress Notes (Signed)
RE:  Barbara [name]      Date of Birth:  11/21/2040     Date:    09/29/23      To Whom It May Concern:  Please be advised that the above-named patient will require a short-term nursing home stay - anticipated 30 days or less for rehabilitation and strengthening.  The plan is for return home.                 MD signature                Date

## 2023-09-29 NOTE — TOC Initial Note (Signed)
Transition of Care [facility]) - Initial/Assessment Note    Patient Details  Name: Barbara [name] MRN: [id number] Date of Birth: September 20, 1941  [facility]) CM/SW Contact:    Lorri Frederick, LCSW Phone Number: 09/29/2023, 3:50 PM  Clinical Narrative:     CSW met with pt regarding PT recommendation for SNF.  Pt agreeable to SNF, medicare choice document provided.  Pt interested in [facility].  Pt from home alone, no current services.  Permission given to speak with sister Lizbeth Bark, son [name], brother Christen Bame.  Referral sent out in hub for SNF.  PASSR requires additional info to [place] must.               Expected Discharge Plan: Skilled Nursing Facility Barriers to Discharge: Continued Medical Work up, SNF Pending bed offer   Patient Goals and CMS Choice Patient states their goals for this hospitalization and ongoing recovery are:: do what I was doing CMS Medicare.gov Compare Post Acute Care list provided to:: Patient Choice offered to / list presented to : Patient      Expected Discharge Plan and Services In-house Referral: Clinical Social Work   Post Acute Care Choice: Skilled Nursing Facility Living arrangements for the past 2 months: Single Family Home                                      Prior Living Arrangements/Services Living arrangements for the past 2 months: Single Family Home Lives with:: Self Patient language and need for interpreter reviewed:: Yes Do you feel safe going back to the place where you live?: Yes      Need for Family Participation in Patient Care: Yes (Comment) Care giver support system in place?: Yes (comment) Current home services: Other (comment) (none) Criminal Activity/Legal Involvement Pertinent to Current Situation/Hospitalization: No - Comment as needed  Activities of Daily Living   ADL Screening (condition at time of admission) Independently performs ADLs?: Yes (appropriate for developmental age) Is the patient  deaf or have difficulty hearing?: No Does the patient have difficulty seeing, even when wearing glasses/contacts?: No Does the patient have difficulty concentrating, remembering, or making decisions?: No  Permission Sought/Granted Permission sought to share information with : Family Supports Permission granted to share information with : Yes, Verbal Permission Granted  Share Information with NAME: sister Lizbeth Bark, son [name], brother Christen Bame  Permission granted to share info w AGENCY: SNF        Emotional Assessment Appearance:: Appears stated age Attitude/Demeanor/Rapport: Engaged Affect (typically observed): Appropriate, Pleasant Orientation: : Oriented to Self, Oriented to Place, Oriented to  Time, Oriented to Situation      Admission diagnosis:  Closed left subtrochanteric femur fracture (HCC) [S72.22XA] Closed fracture of neck of left femur, initial encounter (HCC) [S72.002A] On apixaban therapy [Z79.01] Patient Active Problem List   Diagnosis Date Noted   Closed left subtrochanteric femur fracture (HCC) 09/26/2023   Acute on chronic diastolic (congestive) heart failure (HCC) 08/04/2022   Acute hypoxemic respiratory failure (HCC) 08/04/2022   Bronchitis 08/04/2022   Ureteral stone 06/26/2022   Renal stones 06/26/2022   S/P cystoscopy 06/25/2022   Nephrolithiasis, 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, 5mm in size, leading to moderate left hydroureteronephrosis and significant left perinephric stranding /edema 06/09/2022   OSA (obstructive sleep apnea) 06/09/2022   Tachycardia-bradycardia syndrome (HCC)    Acquired trigger finger of right index finger 05/10/2021   Trigger thumb of left  hand 05/10/2021   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   Atrial fibrillation (HCC) 10/08/2020   Persistent atrial fibrillation (HCC) 10/07/2020   CKD (chronic kidney disease), stage III (HCC) 05/20/2020   Apical variant hypertrophic cardiomyopathy (HCC)     Chest pain of uncertain etiology    Chest pain    S/P reverse total shoulder arthroplasty, 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   Encounter for orthopedic follow-up care 02/18/2020   PAF (paroxysmal atrial fibrillation) (HCC) 02/28/2019   Demand myocardial infarction (HCC) 02/25/2019   Pain in left knee 01/06/2018   Dysphonia 12/29/2017   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   Acute on chronic diastolic CHF (congestive heart failure) (HCC) 11/16/2017   Gastroesophageal reflux disease 11/06/2017   Bradycardia 12/27/2015   Exertional dyspnea 12/23/2014   Sinus bradycardia 09/13/2014   LUMBAGO 05/03/2010   VERTIGO 01/21/2009   TEMPOROMANDIBULAR JOINT PAIN 08/31/2008   Osteoarthritis 05/31/2008   TROCHANTERIC BURSITIS 05/31/2008   Essential hypertension 04/06/2007   CAD (coronary artery disease/elevated troponin 04/06/2007   ALLERGIC RHINITIS 04/06/2007   History of colonic polyps 04/06/2007   Hyperlipidemia 04/06/2007   PCP:  [name], MD Pharmacy:   [id number] [address] [place] - [address] [place] [address] Phone: [phone] Fax: [phone]     Social Determinants of Health (SDOH) Social History: SDOH Screenings   Food Insecurity: No Food Insecurity (09/26/2023)  Housing: Patient Unable To Answer (09/26/2023)  Transportation Needs: No Transportation Needs (09/26/2023)  Utilities: Not At Risk (09/26/2023)  Alcohol Screen: Low Risk  (08/05/2022)  Depression (PHQ2-9): Low Risk  (08/26/2023)  Financial Resource Strain: Low Risk  (08/05/2022)  Physical Activity: Insufficiently Active (07/02/2022)  Social Connections: Moderately Integrated (07/02/2022)  Stress: No Stress Concern Present (07/02/2022)  Tobacco Use: Medium Risk (09/26/2023)   SDOH Interventions:     Readmission Risk Interventions     No data to display

## 2023-09-30 ENCOUNTER — Encounter (HOSPITAL_COMMUNITY): Payer: Self-pay | Admitting: Orthopedic Surgery

## 2023-09-30 ENCOUNTER — Inpatient Hospital Stay (HOSPITAL_COMMUNITY): Payer: PPO

## 2023-09-30 DIAGNOSIS — Z9181 History of falling: Secondary | ICD-10-CM | POA: Diagnosis not present

## 2023-09-30 DIAGNOSIS — Z96642 Presence of left artificial hip joint: Secondary | ICD-10-CM | POA: Diagnosis not present

## 2023-09-30 DIAGNOSIS — E119 Type 2 diabetes mellitus without complications: Secondary | ICD-10-CM | POA: Diagnosis not present

## 2023-09-30 DIAGNOSIS — I129 Hypertensive chronic kidney disease with stage 1 through stage 4 chronic kidney disease, or unspecified chronic kidney disease: Secondary | ICD-10-CM | POA: Diagnosis not present

## 2023-09-30 DIAGNOSIS — I10 Essential (primary) hypertension: Secondary | ICD-10-CM | POA: Diagnosis not present

## 2023-09-30 DIAGNOSIS — I2510 Atherosclerotic heart disease of native coronary artery without angina pectoris: Secondary | ICD-10-CM | POA: Diagnosis not present

## 2023-09-30 DIAGNOSIS — K219 Gastro-esophageal reflux disease without esophagitis: Secondary | ICD-10-CM | POA: Diagnosis not present

## 2023-09-30 DIAGNOSIS — E785 Hyperlipidemia, unspecified: Secondary | ICD-10-CM | POA: Diagnosis not present

## 2023-09-30 DIAGNOSIS — R2689 Other abnormalities of gait and mobility: Secondary | ICD-10-CM | POA: Diagnosis not present

## 2023-09-30 DIAGNOSIS — R531 Weakness: Secondary | ICD-10-CM | POA: Diagnosis not present

## 2023-09-30 DIAGNOSIS — I509 Heart failure, unspecified: Secondary | ICD-10-CM | POA: Diagnosis not present

## 2023-09-30 DIAGNOSIS — I5032 Chronic diastolic (congestive) heart failure: Secondary | ICD-10-CM | POA: Diagnosis not present

## 2023-09-30 DIAGNOSIS — Z950 Presence of cardiac pacemaker: Secondary | ICD-10-CM | POA: Diagnosis not present

## 2023-09-30 DIAGNOSIS — I252 Old myocardial infarction: Secondary | ICD-10-CM | POA: Diagnosis not present

## 2023-09-30 DIAGNOSIS — S72042D Displaced fracture of base of neck of left femur, subsequent encounter for closed fracture with routine healing: Secondary | ICD-10-CM | POA: Diagnosis not present

## 2023-09-30 DIAGNOSIS — I959 Hypotension, unspecified: Secondary | ICD-10-CM | POA: Diagnosis not present

## 2023-09-30 DIAGNOSIS — R41841 Cognitive communication deficit: Secondary | ICD-10-CM | POA: Diagnosis not present

## 2023-09-30 DIAGNOSIS — R011 Cardiac murmur, unspecified: Secondary | ICD-10-CM | POA: Diagnosis not present

## 2023-09-30 DIAGNOSIS — G4733 Obstructive sleep apnea (adult) (pediatric): Secondary | ICD-10-CM | POA: Diagnosis not present

## 2023-09-30 DIAGNOSIS — I4891 Unspecified atrial fibrillation: Secondary | ICD-10-CM | POA: Diagnosis not present

## 2023-09-30 DIAGNOSIS — M6281 Muscle weakness (generalized): Secondary | ICD-10-CM | POA: Diagnosis not present

## 2023-09-30 DIAGNOSIS — G4730 Sleep apnea, unspecified: Secondary | ICD-10-CM | POA: Diagnosis not present

## 2023-09-30 DIAGNOSIS — N1831 Chronic kidney disease, stage 3a: Secondary | ICD-10-CM | POA: Diagnosis not present

## 2023-09-30 DIAGNOSIS — I480 Paroxysmal atrial fibrillation: Secondary | ICD-10-CM | POA: Diagnosis not present

## 2023-09-30 DIAGNOSIS — S79929A Unspecified injury of unspecified thigh, initial encounter: Secondary | ICD-10-CM | POA: Diagnosis not present

## 2023-09-30 DIAGNOSIS — S7222XD Displaced subtrochanteric fracture of left femur, subsequent encounter for closed fracture with routine healing: Secondary | ICD-10-CM | POA: Diagnosis not present

## 2023-09-30 DIAGNOSIS — Z7401 Bed confinement status: Secondary | ICD-10-CM | POA: Diagnosis not present

## 2023-09-30 DIAGNOSIS — M1990 Unspecified osteoarthritis, unspecified site: Secondary | ICD-10-CM | POA: Diagnosis not present

## 2023-09-30 DIAGNOSIS — F419 Anxiety disorder, unspecified: Secondary | ICD-10-CM | POA: Diagnosis not present

## 2023-09-30 MED ORDER — SENNA 8.6 MG PO TABS: 2.0000 | ORAL_TABLET | Freq: Two times a day (BID) | ORAL | Status: DC

## 2023-09-30 MED ORDER — OXYCODONE HCL 5 MG PO TABS: 5.0000 mg | ORAL_TABLET | ORAL | 0 refills | Status: AC | PRN

## 2023-09-30 MED ORDER — ALUM & MAG HYDROXIDE-SIMETH 200-200-20 MG/5ML PO SUSP: 30.0000 mL | ORAL | 0 refills | Status: AC | PRN

## 2023-09-30 MED ORDER — SENNA 8.6 MG PO TABS: 2.0000 | ORAL_TABLET | Freq: Two times a day (BID) | ORAL | 0 refills | Status: DC

## 2023-09-30 NOTE — TOC Transition Note (Signed)
Transition of Care [facility]) - CM/SW Discharge Note   Patient Details  Name: Barbara [name] MRN: [id number] Date of Birth: May 19, 1941  Transition of Care [facility]) CM/SW Contact:  Lorri Frederick, LCSW Phone Number: 09/30/2023, 1:54 PM   Clinical Narrative:   Pt discharging to [facility], room 111.  RN call report to [phone].    Final next level of care: Skilled Nursing Facility Barriers to Discharge: Barriers Resolved   Patient Goals and CMS Choice CMS Medicare.gov Compare Post Acute Care list provided to:: Patient Choice offered to / list presented to : Patient  Discharge Placement                Patient chooses bed at: [facility] Patient to be transferred to facility by: ptar Name of family member notified: son [name] Patient and family notified of of transfer: 09/30/23  Discharge Plan and Services Additional resources added to the After Visit Summary for   In-house Referral: Clinical Social Work   Post Acute Care Choice: Skilled Nursing Facility                               Social Determinants of Health (SDOH) Interventions SDOH Screenings   Food Insecurity: No Food Insecurity (09/26/2023)  Housing: Patient Unable To Answer (09/26/2023)  Transportation Needs: No Transportation Needs (09/26/2023)  Utilities: Not At Risk (09/26/2023)  Alcohol Screen: Low Risk  (08/05/2022)  Depression (PHQ2-9): Low Risk  (08/26/2023)  Financial Resource Strain: Low Risk  (08/05/2022)  Physical Activity: Insufficiently Active (07/02/2022)  Social Connections: Moderately Integrated (07/02/2022)  Stress: No Stress Concern Present (07/02/2022)  Tobacco Use: Medium Risk (09/26/2023)     Readmission Risk Interventions     No data to display

## 2023-09-30 NOTE — Progress Notes (Addendum)
RE:   Barbara [name]     Date of Birth:  2041/10/12     Date:   09/30/23       To Whom It May Concern:  Please be advised that the above-named patient will require a short-term nursing home stay - anticipated 30 days or less for rehabilitation and strengthening.  The plan is for return home.                 MD signature                Date

## 2023-09-30 NOTE — Plan of Care (Signed)
Problem: Education: Goal: Knowledge of General Education information will improve Description: Including pain rating scale, medication(s)/side effects and non-pharmacologic comfort measures Outcome: Adequate for Discharge   Problem: Health Behavior/Discharge Planning: Goal: Ability to manage health-related needs will improve Outcome: Adequate for Discharge   Problem: Clinical Measurements: Goal: Ability to maintain clinical measurements within normal limits will improve Outcome: Adequate for Discharge Goal: Will remain free from infection Outcome: Adequate for Discharge Goal: Diagnostic test results will improve Outcome: Adequate for Discharge Goal: Respiratory complications will improve Outcome: Adequate for Discharge Goal: Cardiovascular complication will be avoided Outcome: Adequate for Discharge   Problem: Activity: Goal: Risk for activity intolerance will decrease Outcome: Adequate for Discharge   Problem: Nutrition: Goal: Adequate nutrition will be maintained Outcome: Adequate for Discharge   Problem: Coping: Goal: Level of anxiety will decrease Outcome: Adequate for Discharge   Problem: Elimination: Goal: Will not experience complications related to bowel motility Outcome: Adequate for Discharge Goal: Will not experience complications related to urinary retention Outcome: Adequate for Discharge   Problem: Pain Management: Goal: General experience of comfort will improve Outcome: Adequate for Discharge   Problem: Safety: Goal: Ability to remain free from injury will improve Outcome: Adequate for Discharge   Problem: Skin Integrity: Goal: Risk for impaired skin integrity will decrease Outcome: Adequate for Discharge   Problem: Education: Goal: Verbalization of understanding the information provided (i.e., activity precautions, restrictions, etc) will improve Outcome: Adequate for Discharge Goal: Individualized Educational Video(s) Outcome: Adequate for  Discharge   Problem: Activity: Goal: Ability to ambulate and perform ADLs will improve Outcome: Adequate for Discharge   Problem: Clinical Measurements: Goal: Postoperative complications will be avoided or minimized Outcome: Adequate for Discharge   Problem: Self-Concept: Goal: Ability to maintain and perform role responsibilities to the fullest extent possible will improve Outcome: Adequate for Discharge   Problem: Pain Management: Goal: Pain level will decrease Outcome: Adequate for Discharge

## 2023-09-30 NOTE — Progress Notes (Addendum)
Mobility Specialist: Progress Note   09/30/23 1212  Mobility  Activity Ambulated with assistance in room  Level of Assistance Minimal assist, patient does 75% or more  Assistive Device Front wheel walker  Distance Ambulated (ft) 4 ft  LLE Weight Bearing WBAT  Activity Response Tolerated well  Mobility Referral Yes  $Mobility charge 1 Mobility  Mobility Specialist Start Time (ACUTE ONLY) 1123  Mobility Specialist Stop Time (ACUTE ONLY) 1200  Mobility Specialist Time Calculation (min) (ACUTE ONLY) 37 min    Pt was agreeable to mobility session - received in chair. Had c/o L-sided pain in groin area and area superior to incision. Performed knee kicks and seated marches (10x each leg) with min assist for LLE. Light modA for STS once pt put weight on LLE. MinA for ambulation with verbal cues for RW use. Returned to chair without fault. Left in chair with all needs met, call bell in reach.  MS returned to transfer pt back to bed. Pt was agreeable to mobility session - received in chair. Had c/o soreness. Light modA for STS with good hand placement, minA for pivot, minA for bed mobility to assist LLE. Left in bed with all needs met, call bell in reach.   Maurene Capes Mobility Specialist Please contact via SecureChat or Rehab office at [phone]

## 2023-09-30 NOTE — Addendum Note (Signed)
Addendum  created 09/30/23 1636 by Lewie Loron, MD   Intraprocedure Event edited, Intraprocedure Meds edited, Intraprocedure Staff edited

## 2023-09-30 NOTE — TOC Progression Note (Addendum)
Transition of Care [facility]) - Progression Note    Patient Details  Name: Barbara [name] MRN: [id number] Date of Birth: Apr 11, 1941  Transition of [facility] CM/SW Contact  Lorri Frederick, LCSW Phone Number: 09/30/2023, 9:49 AM  Clinical Narrative:   CSW spoke with pt, she does want to accept offer at [facility]  CSW then spoke with pt son Ophelia Charter, who lives in [place].  He wanted to discuss other offers with pt.  CSW spoke with pt again and they have decided to accept offer at [facility].  Auth request--left message with [employer].  1200: Auth approved: SNF 7 days: [id number], PTAR: [id number].  PASSR received: [id number].    Expected Discharge Plan: Skilled Nursing Facility Barriers to Discharge: Continued Medical Work up, SNF Pending bed offer  Expected Discharge Plan and Services In-house Referral: Clinical Social Work   Post Acute Care Choice: Skilled Nursing Facility Living arrangements for the past 2 months: Single Family Home                                       Social Determinants of Health (SDOH) Interventions SDOH Screenings   Food Insecurity: No Food Insecurity (09/26/2023)  Housing: Patient Unable To Answer (09/26/2023)  Transportation Needs: No Transportation Needs (09/26/2023)  Utilities: Not At Risk (09/26/2023)  Alcohol Screen: Low Risk  (08/05/2022)  Depression (PHQ2-9): Low Risk  (08/26/2023)  Financial Resource Strain: Low Risk  (08/05/2022)  Physical Activity: Insufficiently Active (07/02/2022)  Social Connections: Moderately Integrated (07/02/2022)  Stress: No Stress Concern Present (07/02/2022)  Tobacco Use: Medium Risk (09/26/2023)    Readmission Risk Interventions     No data to display

## 2023-09-30 NOTE — Discharge Summary (Signed)
Physician Discharge Summary  Barbara [name] UEA:[id number] DOB: [birth year] DOA: 09/26/2023  PCP: [name], MD  Admit date: 09/26/2023  Discharge date: 09/30/2023  Admitted From: Home  Disposition:  SNF  Recommendations for Outpatient Follow-up:  Follow up with PCP in 1-2 weeks. Please obtain BMP/CBC in one week. Advised to take oxycodone as needed for pain control.. Advised to follow-up with orthopedics in 2 weeks for postoperative follow-up.  Home Health: None Equipment/Devices:None  Discharge Condition: Stable CODE STATUS: Full code Diet recommendation: Heart Healthy   Brief [facility] Course: This 82 y.o. female with PMH significant for CAD, diastolic CHF, A-fib on Eliquis, essential hypertension, hyperlipidemia, anxiety disorder, CKD stage IIIa presented in the ED status post mechanical fall with left hip pain.  Patient states she was fully functional, lives alone, she was looking at the dog and she missed the step and fell on her left side and has developed left hip pain.  She was unable to stand up.  She was on the floor for 20 minutes.  X-ray left hip Acute displaced left femoral neck fracture.  Orthopedic consulted.  Patient is s/p ORIF.  POD #2.  She has done the physical therapy and was recommended skilled nursing facility.  Patient is cleared from orthopedics to be discharged with follow-up in 2 weeks for postoperative checkup.  Patient reports pain is reasonably controlled.  Patient is being discharged to SNF.  Discharge Diagnoses:  Principal Problem:   Closed left subtrochanteric femur fracture (HCC) Active Problems:   CAD (coronary artery disease/elevated troponin   CKD (chronic kidney disease), stage III (HCC)   PAF (paroxysmal atrial fibrillation) (HCC)   Essential hypertension   Hyperlipidemia   Gastroesophageal reflux disease   Osteoarthritis  Left femoral neck fracture status post mechanical fall: Patient tripped and fell on the left  side while going downstairs and has developed left thigh pain. X-ray shows acute displaced left femoral neck fracture. Orthopedics consulted.  Status post ORIF POD # 3. Adequate pain control. Continue IV hydration. PT and OT recommended SNF Orthopedics signed off,  recommended 2 weeks follow-up for postoperative check.   Essential hypertension: Continue hydralazine 150 mg daily Continue Toprol 100 mg daily Continue Imdur 30 mg daily   Hyperlipidemia: Continue Crestor 40 mg daily   CAD: Continue aspirin, Crestor, Imdur.   Paroxysmal A-fib: Heart rate is well-controlled. Continue metoprolol Resume Eliquis.   Anxiety disorder Continue lorazepam 0.5 mg q6hr prn   GERD Continue pantoprazole 40 mg daily.   CKD stage IIIA: Serum creatinine at baseline.   Permanent pacemaker: Stable.  Continue follow-up outpatient  Discharge Instructions  Discharge Instructions     Call MD for:  difficulty breathing, headache or visual disturbances   Complete by: As directed    Call MD for:  persistant dizziness or light-headedness   Complete by: As directed    Call MD for:  persistant nausea and vomiting   Complete by: As directed    Diet - low sodium heart healthy   Complete by: As directed    Diet Carb Modified   Complete by: As directed    Discharge instructions   Complete by: As directed    Advised to follow-up with primary care physician in 1 week. Advised to take oxycodone as needed for pain control. Advised to follow-up with orthopedics in 2 weeks for postoperative follow-up.   Increase activity slowly   Complete by: As directed       Allergies as of 09/30/2023  Reactions   Lidocaine Anaphylaxis   Morphine Other (See Comments)   "Body shuts down"   Procaine Hcl Anaphylaxis, Rash, Other (See Comments)   "Anything with 'caine' in it "   Sulfonamide Derivatives Hives   Amlodipine Swelling   Norco [hydrocodone-acetaminophen] Nausea And Vomiting, Other (See  Comments)   States does ok with IV form    Tramadol Nausea And Vomiting   Vytorin [ezetimibe-simvastatin] Other (See Comments)   Joint pain   Tape Itching, Other (See Comments)   Patient prefers either paper tape or Coban wrap        Medication List     TAKE these medications    acetaminophen 500 MG tablet Commonly known as: TYLENOL Take 500 mg by mouth daily as needed for headache.   alum & mag hydroxide-simeth 200-200-20 MG/5ML suspension Commonly known as: MAALOX/MYLANTA Take 30 mLs by mouth every 4 (four) hours as needed for indigestion.   Aspirin Low Dose 81 MG chewable tablet Generic drug: aspirin Chew 1 tablet (81 mg total) by mouth daily. What changed: when to take this   Blood Pressure Monitor Automat Devi 1 Units/min by Does not apply route daily.   dextromethorphan 30 MG/5ML liquid Commonly known as: DELSYM Take 15 mg by mouth daily as needed for cough.   docusate sodium 100 MG capsule Commonly known as: COLACE Take 100 mg by mouth at bedtime.   Eliquis 2.5 MG Tabs tablet Generic drug: apixaban Take 1 tablet by mouth twice daily   furosemide 20 MG tablet Commonly known as: LASIX Take 1 tablet by mouth once daily What changed: when to take this   hydrALAZINE 100 MG tablet Commonly known as: APRESOLINE Take 1.5 tablets (150 mg total) by mouth 2 (two) times daily.   ipratropium 0.06 % nasal spray Commonly known as: ATROVENT Place 2 sprays into both nostrils 2 (two) times daily as needed (allergies).   isosorbide mononitrate 30 MG 24 hr tablet Commonly known as: IMDUR Take 1 tablet (30 mg total) by mouth daily. What changed: when to take this   Jardiance 10 MG Tabs tablet Generic drug: empagliflozin Take 1 tablet by mouth once daily   loratadine 10 MG tablet Commonly known as: CLARITIN Take 10 mg by mouth daily as needed for allergies.   LORazepam 0.5 MG tablet Commonly known as: ATIVAN TAKE 1 TABLET BY MOUTH EVERY 6 HOURS AS NEEDED FOR  ANXIETY What changed:  when to take this additional instructions   metoprolol succinate 100 MG 24 hr tablet Commonly known as: TOPROL-XL Take 1 tablet (100 mg total) by mouth every evening. Take with or immediately following a meal. What changed: when to take this   nitroGLYCERIN 0.4 MG SL tablet Commonly known as: NITROSTAT DISSOLVE ONE TABLET UNDER THE TONGUE EVERY 5 MINUTES AS NEEDED FOR CHEST PAIN.  DO NOT EXCEED A TOTAL OF 3 DOSES IN 15 MINUTES What changed: See the new instructions.   oxyCODONE 5 MG immediate release tablet Commonly known as: Oxy IR/ROXICODONE Take 1 tablet (5 mg total) by mouth every 4 (four) hours as needed for up to 3 days for severe pain (pain score 7-10).   pantoprazole 40 MG tablet Commonly known as: PROTONIX Take 1 tablet (40 mg total) by mouth daily. What changed: when to take this   potassium chloride 10 MEQ tablet Commonly known as: KLOR-CON Take 1 tablet (10 mEq total) by mouth daily.   PROBIOTIC GUMMIES PO Take 5 each by mouth every morning. Align Probiotic  rosuvastatin 40 MG tablet Commonly known as: CRESTOR Take 1 tablet by mouth once daily What changed: when to take this   senna 8.6 MG Tabs tablet Commonly known as: SENOKOT Take 2 tablets (17.2 mg total) by mouth 2 (two) times daily.   triamcinolone 55 MCG/ACT nasal inhaler Commonly known as: NASACORT Place 1 spray into both nostrils daily as needed (for allergies).   Vitamin D3 50 MCG (2000 UT) Tabs Take 2,000 Units by mouth in the morning.   ZZZQUIL PO Take 1 each by mouth at bedtime. Vicks Pure Zzz's chew tablet for Sleep        Contact information for follow-up providers     Sheral Apley, MD. Schedule an appointment as soon as possible for a visit in 2 week(s).   Specialty: Orthopedic Surgery Contact information: [address] [place] [address] [phone]         [name], MD Follow up in 1 week(s).   Specialty: Family  Medicine Contact information: [address] [place]ena Flake [place] [address] [phone]              Contact information for after-discharge care     Destination     [facility] Preferred SNF .   Service: Skilled Nursing Contact information: [address] [place] [address] [phone]                    Allergies  Allergen Reactions   Lidocaine Anaphylaxis   Morphine Other (See Comments)    "Body shuts down"   Procaine Hcl Anaphylaxis, Rash and Other (See Comments)    "Anything with 'caine' in it "   Sulfonamide Derivatives Hives   Amlodipine Swelling   Norco [Hydrocodone-Acetaminophen] Nausea And Vomiting and Other (See Comments)    States does ok with IV form    Tramadol Nausea And Vomiting   Vytorin [Ezetimibe-Simvastatin] Other (See Comments)    Joint pain   Tape Itching and Other (See Comments)    Patient prefers either paper tape or Coban wrap    Consultations: Orthopeadics   Procedures/Studies: DG Chest Portable 1 View  Result Date: 09/26/2023 CLINICAL DATA:  Left hip fracture.  Fall. EXAM: PORTABLE CHEST 1 VIEW COMPARISON:  Chest x-ray dated August 04, 2022. FINDINGS: Unchanged left chest wall pacemaker. Similar mild cardiomegaly. Chronic scarring at the left lung base. No focal consolidation, pleural effusion, or pneumothorax. No acute osseous abnormality. IMPRESSION: 1. No active disease. Electronically Signed   By: Obie Dredge M.D.   On: 09/26/2023 14:01   DG Hip Unilat W or Wo Pelvis 2-3 Views Left  Result Date: 09/26/2023 CLINICAL DATA:  Left hip pain after fall. EXAM: DG HIP (WITH OR WITHOUT PELVIS) 2-3V LEFT COMPARISON:  CT abdomen pelvis dated June 09, 2022. FINDINGS: Acute displaced fracture of the left femoral neck with significant varus angulation. No dislocation. No additional fracture. IMPRESSION: 1. Acute displaced left femoral neck fracture. Electronically Signed   By: Obie Dredge M.D.    On: 09/26/2023 14:00     Subjective: Patient was seen and examined at bedside. Overnight events noted.   Patient reports doing much better,  reports pain is reasonably controlled.   Patient is being discharged to SNF for rehab.  Discharge Exam: Vitals:   09/29/23 2201 09/30/23 0715  BP: (!) 126/46 (!) 169/63  Pulse: 60 (!) 59  Resp: 18 17  Temp: 98.3 F (36.8 C) 98.5 F (36.9 C)  SpO2:  97% 96%   Vitals:   09/29/23 0803 09/29/23 1443 09/29/23 2201 09/30/23 0715  BP: (!) 160/66 (!) 132/57 (!) 126/46 (!) 169/63  Pulse: (!) 59 (!) 59 60 (!) 59  Resp: 15 17 18 17   Temp: 98.6 F (37 C) 98.6 F (37 C) 98.3 F (36.8 C) 98.5 F (36.9 C)  TempSrc: Oral Oral Oral Oral  SpO2: 96% 96% 97% 96%  Weight:      Height:        General: Pt is alert, awake, not in acute distress Cardiovascular: RRR, S1/S2 +, no rubs, no gallops Respiratory: CTA bilaterally, no wheezing, no rhonchi Abdominal: Soft, NT, ND, bowel sounds + Extremities: no edema, no cyanosis    The results of significant diagnostics from this hospitalization (including imaging, microbiology, ancillary and laboratory) are listed below for reference.     Microbiology: Recent Results (from the past 240 hour(s))  Surgical pcr screen     Status: None   Collection Time: 09/26/23  9:17 PM   Specimen: Nasal Mucosa; Nasal Swab  Result Value Ref Range Status   MRSA, PCR NEGATIVE NEGATIVE Final   Staphylococcus aureus NEGATIVE NEGATIVE Final    Comment: (NOTE) The Xpert SA Assay (FDA approved for NASAL specimens in patients 33 years of age and older), is one component of a comprehensive surveillance program. It is not intended to diagnose infection nor to guide or monitor treatment. Performed at [facility], [address], [place], [place] [address]      Labs: BNP (last 3 results) No results for input(s): "BNP" in the last 8760 hours. Basic Metabolic Panel: Recent Labs  Lab 09/26/23 1248 09/27/23 0519  NA  139 138  K 3.4* 3.7  CL 103 105  CO2 24 21*  GLUCOSE 100* 84  BUN 17 22  CREATININE 1.53* 1.58*  CALCIUM 9.8 9.9  MG  --  1.8  PHOS  --  3.4   Liver Function Tests: No results for input(s): "AST", "ALT", "ALKPHOS", "BILITOT", "PROT", "ALBUMIN" in the last 168 hours. No results for input(s): "LIPASE", "AMYLASE" in the last 168 hours. No results for input(s): "AMMONIA" in the last 168 hours. CBC: Recent Labs  Lab 09/26/23 1248 09/27/23 0519  WBC 5.3 7.4  NEUTROABS 4.2  --   HGB 10.9* 10.4*  HCT 35.7* 33.4*  MCV 87.7 86.8  PLT 183 179   Cardiac Enzymes: No results for input(s): "CKTOTAL", "CKMB", "CKMBINDEX", "TROPONINI" in the last 168 hours. BNP: Invalid input(s): "POCBNP" CBG: Recent Labs  Lab 09/26/23 1245  GLUCAP 91   D-Dimer No results for input(s): "DDIMER" in the last 72 hours. Hgb A1c No results for input(s): "HGBA1C" in the last 72 hours. Lipid Profile No results for input(s): "CHOL", "HDL", "LDLCALC", "TRIG", "CHOLHDL", "LDLDIRECT" in the last 72 hours. Thyroid function studies No results for input(s): "TSH", "T4TOTAL", "T3FREE", "THYROIDAB" in the last 72 hours.  Invalid input(s): "FREET3" Anemia work up No results for input(s): "VITAMINB12", "FOLATE", "FERRITIN", "TIBC", "IRON", "RETICCTPCT" in the last 72 hours. Urinalysis    Component Value Date/Time   COLORURINE YELLOW 06/09/2022 1426   APPEARANCEUR CLOUDY (A) 06/09/2022 1426   LABSPEC 1.010 06/09/2022 1426   PHURINE 6.5 06/09/2022 1426   GLUCOSEU NEGATIVE 06/09/2022 1426   GLUCOSEU NEGATIVE 09/13/2021 1203   HGBUR LARGE (A) 06/09/2022 1426   HGBUR negative 10/17/2010 0859   BILIRUBINUR NEGATIVE 06/09/2022 1426   BILIRUBINUR neg 08/01/2021 1037   KETONESUR NEGATIVE 06/09/2022 1426   PROTEINUR NEGATIVE 06/09/2022 1426  UROBILINOGEN 0.2 09/13/2021 1203   NITRITE NEGATIVE 06/09/2022 1426   LEUKOCYTESUR TRACE (A) 06/09/2022 1426   Sepsis Labs Recent Labs  Lab 09/26/23 1248  09/27/23 0519  WBC 5.3 7.4   Microbiology Recent Results (from the past 240 hour(s))  Surgical pcr screen     Status: None   Collection Time: 09/26/23  9:17 PM   Specimen: Nasal Mucosa; Nasal Swab  Result Value Ref Range Status   MRSA, PCR NEGATIVE NEGATIVE Final   Staphylococcus aureus NEGATIVE NEGATIVE Final    Comment: (NOTE) The Xpert SA Assay (FDA approved for NASAL specimens in patients 64 years of age and older), is one component of a comprehensive surveillance program. It is not intended to diagnose infection nor to guide or monitor treatment. Performed at [facility], [address] [place], [place] [address]      Time coordinating discharge: Over 30 minutes  SIGNED:   Willeen Niece, MD  [facility] 09/30/2023, 1:48 PM Pager   If 7PM-7AM, please contact night-coverage

## 2023-09-30 NOTE — Progress Notes (Signed)
AVS reviewed and placed in DC packet.  Report called to [employer] and all questions answered.  Patient excited for DC.  All Questions answered.

## 2023-10-03 DIAGNOSIS — I2510 Atherosclerotic heart disease of native coronary artery without angina pectoris: Secondary | ICD-10-CM | POA: Diagnosis not present

## 2023-10-03 DIAGNOSIS — I5032 Chronic diastolic (congestive) heart failure: Secondary | ICD-10-CM | POA: Diagnosis not present

## 2023-10-03 DIAGNOSIS — I129 Hypertensive chronic kidney disease with stage 1 through stage 4 chronic kidney disease, or unspecified chronic kidney disease: Secondary | ICD-10-CM | POA: Diagnosis not present

## 2023-10-03 DIAGNOSIS — S7222XD Displaced subtrochanteric fracture of left femur, subsequent encounter for closed fracture with routine healing: Secondary | ICD-10-CM | POA: Diagnosis not present

## 2023-10-06 ENCOUNTER — Telehealth: Payer: Self-pay | Admitting: Cardiovascular Disease

## 2023-10-06 NOTE — Telephone Encounter (Signed)
Attempted to call patient [phone] and the call could not be completed as dialed. Unable to leave message. Attempted to call home number [phone] with no answer. Left message for patient to return call.

## 2023-10-06 NOTE — Telephone Encounter (Signed)
Patient states she normally uses a CPAP but she is currently in rehab due to falling an injuring her hip, so her equipment is at home. She would like to know if she will need to have new equipment ordered to the rehab facility. Please advise.

## 2023-10-07 ENCOUNTER — Encounter (HOSPITAL_COMMUNITY): Payer: Self-pay | Admitting: Orthopedic Surgery

## 2023-10-07 NOTE — Telephone Encounter (Signed)
Left message  on home  number to call office . Unable to leave message on cell voicemail.

## 2023-10-07 NOTE — Telephone Encounter (Signed)
RN reviewed patient's discharge notes 09/30/23. Patient was discharge  to [facility]  rehab   RN called spoke to patient's nurse Atanza at the facility .   RN informed Atanza - the office was unable to contact patient  due to no one answering either number we have on file.   RN relayed - that patient had contact office  inquiring about the use of her personnel CPAP machine or did she need a new machine since she was at the Rehab facility.   RN informed nurse to let patient know- she will need to have a family member bring her machine for her  use.  It is depending on the facility if they allow her to use  equipment.    Name  of office and phone number given.  Per facility nurse ,it was documented that patient indicated  that she was not using the machine.  Atanza voiced understanding.

## 2023-10-09 DIAGNOSIS — I10 Essential (primary) hypertension: Secondary | ICD-10-CM | POA: Diagnosis not present

## 2023-10-09 DIAGNOSIS — I509 Heart failure, unspecified: Secondary | ICD-10-CM | POA: Diagnosis not present

## 2023-10-09 DIAGNOSIS — E785 Hyperlipidemia, unspecified: Secondary | ICD-10-CM | POA: Diagnosis not present

## 2023-10-09 DIAGNOSIS — I4891 Unspecified atrial fibrillation: Secondary | ICD-10-CM | POA: Diagnosis not present

## 2023-10-09 DIAGNOSIS — S7222XD Displaced subtrochanteric fracture of left femur, subsequent encounter for closed fracture with routine healing: Secondary | ICD-10-CM | POA: Diagnosis not present

## 2023-10-10 DIAGNOSIS — S72042D Displaced fracture of base of neck of left femur, subsequent encounter for closed fracture with routine healing: Secondary | ICD-10-CM | POA: Diagnosis not present

## 2023-10-16 DIAGNOSIS — I4891 Unspecified atrial fibrillation: Secondary | ICD-10-CM | POA: Diagnosis not present

## 2023-10-16 DIAGNOSIS — E785 Hyperlipidemia, unspecified: Secondary | ICD-10-CM | POA: Diagnosis not present

## 2023-10-16 DIAGNOSIS — R531 Weakness: Secondary | ICD-10-CM | POA: Diagnosis not present

## 2023-10-16 DIAGNOSIS — E119 Type 2 diabetes mellitus without complications: Secondary | ICD-10-CM | POA: Diagnosis not present

## 2023-10-16 DIAGNOSIS — K219 Gastro-esophageal reflux disease without esophagitis: Secondary | ICD-10-CM | POA: Diagnosis not present

## 2023-10-16 DIAGNOSIS — S7222XD Displaced subtrochanteric fracture of left femur, subsequent encounter for closed fracture with routine healing: Secondary | ICD-10-CM | POA: Diagnosis not present

## 2023-10-16 DIAGNOSIS — I10 Essential (primary) hypertension: Secondary | ICD-10-CM | POA: Diagnosis not present

## 2023-10-16 DIAGNOSIS — I509 Heart failure, unspecified: Secondary | ICD-10-CM | POA: Diagnosis not present

## 2023-10-17 ENCOUNTER — Telehealth: Payer: Self-pay | Admitting: Family Medicine

## 2023-10-17 NOTE — Telephone Encounter (Signed)
Barbara [name] with [facility]  FYI: Called to say Pt is being discharged tomorrow from [facility] and they will evaluate her on Tuesday 10/21/23 for PT and Skilled Nursing.

## 2023-10-21 ENCOUNTER — Telehealth: Payer: Self-pay | Admitting: Family Medicine

## 2023-10-21 DIAGNOSIS — I5032 Chronic diastolic (congestive) heart failure: Secondary | ICD-10-CM | POA: Diagnosis not present

## 2023-10-21 DIAGNOSIS — M6281 Muscle weakness (generalized): Secondary | ICD-10-CM | POA: Diagnosis not present

## 2023-10-21 DIAGNOSIS — R2689 Other abnormalities of gait and mobility: Secondary | ICD-10-CM | POA: Diagnosis not present

## 2023-10-21 DIAGNOSIS — E785 Hyperlipidemia, unspecified: Secondary | ICD-10-CM | POA: Diagnosis not present

## 2023-10-21 DIAGNOSIS — I2510 Atherosclerotic heart disease of native coronary artery without angina pectoris: Secondary | ICD-10-CM | POA: Diagnosis not present

## 2023-10-21 DIAGNOSIS — S7222XD Displaced subtrochanteric fracture of left femur, subsequent encounter for closed fracture with routine healing: Secondary | ICD-10-CM | POA: Diagnosis not present

## 2023-10-21 DIAGNOSIS — I480 Paroxysmal atrial fibrillation: Secondary | ICD-10-CM | POA: Diagnosis not present

## 2023-10-21 DIAGNOSIS — N1831 Chronic kidney disease, stage 3a: Secondary | ICD-10-CM | POA: Diagnosis not present

## 2023-10-21 DIAGNOSIS — Z7901 Long term (current) use of anticoagulants: Secondary | ICD-10-CM | POA: Diagnosis not present

## 2023-10-21 DIAGNOSIS — I130 Hypertensive heart and chronic kidney disease with heart failure and stage 1 through stage 4 chronic kidney disease, or unspecified chronic kidney disease: Secondary | ICD-10-CM | POA: Diagnosis not present

## 2023-10-21 NOTE — Telephone Encounter (Signed)
Nynica PT [employer] is calling need VO   PT 2X1, 1X2, 2X2

## 2023-10-22 ENCOUNTER — Ambulatory Visit: Payer: PPO | Admitting: Family Medicine

## 2023-10-22 ENCOUNTER — Encounter: Payer: Self-pay | Admitting: Family Medicine

## 2023-10-22 VITALS — BP 110/62 | HR 64 | Temp 97.7°F | Wt 147.0 lb

## 2023-10-22 DIAGNOSIS — I10 Essential (primary) hypertension: Secondary | ICD-10-CM | POA: Diagnosis not present

## 2023-10-22 DIAGNOSIS — S72002A Fracture of unspecified part of neck of left femur, initial encounter for closed fracture: Secondary | ICD-10-CM

## 2023-10-22 DIAGNOSIS — I5032 Chronic diastolic (congestive) heart failure: Secondary | ICD-10-CM

## 2023-10-22 DIAGNOSIS — N1832 Chronic kidney disease, stage 3b: Secondary | ICD-10-CM

## 2023-10-22 DIAGNOSIS — I480 Paroxysmal atrial fibrillation: Secondary | ICD-10-CM

## 2023-10-22 NOTE — Progress Notes (Signed)
   Subjective:    Patient ID: Barbara [name], female    DOB: July 17, 1941, 82 y.o.   MRN: [id number]  HPI Here to follow up on a hospital stay from 09-26-23 to 09-30-23 for a fractured left femur. She was going down some steps outside her home and she missed a step, causing her to fall onto her left side. Xrays showed a displaced femoral neck fracture. She underwent an ORIF procedure per Dr. Charlena Cross, and this went quite well. She then spent a rehab stay at [facility] until she was able to go home on 10-18-23. She is now getting PT and OT. She has no pain in the left hip at all. While in the hospital her creatinine was 1.58 and the GFR was 32.    Review of Systems  Constitutional: Negative.   Respiratory: Negative.    Cardiovascular: Negative.        Objective:   Physical Exam Constitutional:      Comments: In a wheelchair   Cardiovascular:     Rate and Rhythm: Normal rate. Rhythm irregular.     Pulses: Normal pulses.     Heart sounds: Normal heart sounds.  Pulmonary:     Effort: Pulmonary effort is normal.     Breath sounds: Normal breath sounds.  Neurological:     Mental Status: She is alert.           Assessment & Plan:  She is recovering well from surgery to repair a fractured left femur. Otherwise her CHF and CAD and HTN are stable. We will refer her to Nephrology for the CKD. She will continue with PT and OT. We spent a total of ( 34  ) minutes reviewing records and discussing these issues.  Gershon Crane, MD

## 2023-10-22 NOTE — Telephone Encounter (Signed)
Ok for VO. 

## 2023-10-22 NOTE — Telephone Encounter (Signed)
Please okay the orders  

## 2023-10-23 NOTE — Telephone Encounter (Signed)
Spoke with Nynica with [employer] H Had vised that VO requested have been approved

## 2023-10-24 DIAGNOSIS — E785 Hyperlipidemia, unspecified: Secondary | ICD-10-CM | POA: Diagnosis not present

## 2023-10-24 DIAGNOSIS — R41841 Cognitive communication deficit: Secondary | ICD-10-CM | POA: Diagnosis not present

## 2023-10-24 DIAGNOSIS — I130 Hypertensive heart and chronic kidney disease with heart failure and stage 1 through stage 4 chronic kidney disease, or unspecified chronic kidney disease: Secondary | ICD-10-CM | POA: Diagnosis not present

## 2023-10-24 DIAGNOSIS — I5032 Chronic diastolic (congestive) heart failure: Secondary | ICD-10-CM | POA: Diagnosis not present

## 2023-10-24 DIAGNOSIS — N1831 Chronic kidney disease, stage 3a: Secondary | ICD-10-CM | POA: Diagnosis not present

## 2023-10-24 DIAGNOSIS — M6281 Muscle weakness (generalized): Secondary | ICD-10-CM | POA: Diagnosis not present

## 2023-10-24 DIAGNOSIS — R2689 Other abnormalities of gait and mobility: Secondary | ICD-10-CM | POA: Diagnosis not present

## 2023-10-24 DIAGNOSIS — Z9181 History of falling: Secondary | ICD-10-CM | POA: Diagnosis not present

## 2023-10-24 DIAGNOSIS — I2510 Atherosclerotic heart disease of native coronary artery without angina pectoris: Secondary | ICD-10-CM | POA: Diagnosis not present

## 2023-10-24 DIAGNOSIS — Z7901 Long term (current) use of anticoagulants: Secondary | ICD-10-CM | POA: Diagnosis not present

## 2023-10-24 DIAGNOSIS — S7222XD Displaced subtrochanteric fracture of left femur, subsequent encounter for closed fracture with routine healing: Secondary | ICD-10-CM

## 2023-10-24 DIAGNOSIS — I480 Paroxysmal atrial fibrillation: Secondary | ICD-10-CM | POA: Diagnosis not present

## 2023-10-31 ENCOUNTER — Telehealth: Payer: Self-pay | Admitting: Cardiovascular Disease

## 2023-10-31 DIAGNOSIS — G4733 Obstructive sleep apnea (adult) (pediatric): Secondary | ICD-10-CM

## 2023-10-31 NOTE — Telephone Encounter (Signed)
Patient is calling stating that she need orders for a new CPAP machine and supplies. [facility]  [phone] opt 4.

## 2023-11-04 DIAGNOSIS — G4733 Obstructive sleep apnea (adult) (pediatric): Secondary | ICD-10-CM | POA: Diagnosis not present

## 2023-11-06 ENCOUNTER — Telehealth: Payer: Self-pay | Admitting: *Deleted

## 2023-11-06 NOTE — Telephone Encounter (Signed)
 Copied from CRM #[id number]. Topic: General - Other >> Nov 04, 2023  1:20 PM [place] A wrote: Reason for CRM: Almira with [facility] called to report patients weight at 147lbs. Almira's contact number is [phone]

## 2023-11-07 NOTE — Telephone Encounter (Signed)
 Order for new CPAP and supplies sent to [facility] today 11/07/23

## 2023-11-10 ENCOUNTER — Ambulatory Visit (INDEPENDENT_AMBULATORY_CARE_PROVIDER_SITE_OTHER): Payer: Medicare Other

## 2023-11-10 ENCOUNTER — Telehealth: Payer: Self-pay

## 2023-11-10 DIAGNOSIS — I495 Sick sinus syndrome: Secondary | ICD-10-CM | POA: Diagnosis not present

## 2023-11-10 NOTE — Telephone Encounter (Signed)
 Copied from CRM [id number]. Topic: General - Other >> Nov 10, 2023  4:29 PM Merlynn A wrote: Reason for CRM: Madeleine Colorado called in from [facility] and wanted to let provider know that patients weight has increased to 149lbs. If additional information regarding patient is needed, please reach out to [facility] at [phone].

## 2023-11-11 LAB — CUP PACEART REMOTE DEVICE CHECK
Battery Remaining Longevity: 139 mo
Battery Voltage: 3.04 V
Brady Statistic AP VP Percent: 0.04 %
Brady Statistic AP VS Percent: 99.55 %
Brady Statistic AS VP Percent: 0 %
Brady Statistic AS VS Percent: 0.41 %
Brady Statistic RA Percent Paced: 99.57 %
Brady Statistic RV Percent Paced: 0.04 %
Date Time Interrogation Session: 20250105233103
Implantable Lead Connection Status: 753985
Implantable Lead Connection Status: 753985
Implantable Lead Implant Date: 20230705
Implantable Lead Implant Date: 20230705
Implantable Lead Location: 753859
Implantable Lead Location: 753860
Implantable Lead Model: 3830
Implantable Lead Model: 5076
Implantable Pulse Generator Implant Date: 20230705
Lead Channel Impedance Value: 304 Ohm
Lead Channel Impedance Value: 323 Ohm
Lead Channel Impedance Value: 361 Ohm
Lead Channel Impedance Value: 456 Ohm
Lead Channel Pacing Threshold Amplitude: 0.875 V
Lead Channel Pacing Threshold Amplitude: 1.375 V
Lead Channel Pacing Threshold Pulse Width: 0.4 ms
Lead Channel Pacing Threshold Pulse Width: 0.4 ms
Lead Channel Sensing Intrinsic Amplitude: 1.625 mV
Lead Channel Sensing Intrinsic Amplitude: 1.625 mV
Lead Channel Sensing Intrinsic Amplitude: 10.5 mV
Lead Channel Sensing Intrinsic Amplitude: 10.5 mV
Lead Channel Setting Pacing Amplitude: 1.75 V
Lead Channel Setting Pacing Amplitude: 2.75 V
Lead Channel Setting Pacing Pulse Width: 0.4 ms
Lead Channel Setting Sensing Sensitivity: 0.9 mV
Zone Setting Status: 755011
Zone Setting Status: 755011

## 2023-11-11 NOTE — Progress Notes (Signed)
 Cardiology Clinic Note   Patient Name: Barbara [name] Date of Encounter: 11/14/2023  Primary Care Provider:  Johnny Garnette LABOR, [name] Primary Cardiologist:  Barbara Sor, [name]  Patient Profile     Barbara [name] 83 year old female presents to the clinic today for follow-up evaluation of her coronary artery disease, hypertension, and paroxysmal atrial fibrillation.  Past Medical History    Past Medical History:  Diagnosis Date   A-fib [facility])    Allergy    CAD (coronary artery disease)    sees Dr. Debby [name]  cardiac stents - 2000   CHF (congestive heart failure) (HCC)    Colon polyps    Complication of anesthesia    rash/hives with caines   Dyspnea    02/12/18  when my heart gets out of rhythm, it has not been out of rhythym- since I have been on Tikosyn  (11/2017)   Dysrhythmia    afib fib   GERD (gastroesophageal reflux disease)    takes OTC- Omeprazole - prn   Heart murmur    History of kidney stones    History of stress test    show normal perfusion without scar or ischemia, post EF 68%   Hx of echocardiogram    show an EF 55%-60% range with grade 1 diastolic dysfunction, she had mitral anular calcification with mild MR, moderate LA dilation and mild pulmonary hypertension with a PA estimated pressure of 39mm   Hyperlipidemia    Hypertension    NSTEMI (non-ST elevated myocardial infarction) (HCC)    Osteoarthritis    Pacemaker    Pneumonia    hx of 2015    PONV (postoperative nausea and vomiting)    Sleep apnea    Past Surgical History:  Procedure Laterality Date   CARDIAC CATHETERIZATION     11/2017   cardiac stents  2000   COLONOSCOPY  01-05-14   per Dr. Norleen [name], clear, no repeats needed    COLONOSCOPY     CORONARY STENT PLACEMENT  2000   in LAD   CYSTOSCOPY WITH RETROGRADE PYELOGRAM, URETEROSCOPY AND STENT PLACEMENT Left 06/11/2022   Procedure: CYSTOSCOPY WITH RETROGRADE PYELOGRAM, LEFT STENT PLACEMENT;  Surgeon: Barbara [name] DOUGLAS, [name];   Location: [facility]S;  Service: Urology;  Laterality: Left;   CYSTOSCOPY/URETEROSCOPY/HOLMIUM LASER/STENT PLACEMENT Left 06/25/2022   Procedure: CYSTOSCOPY LEFT URETEROSCOPY/HOLMIUM LASER/STENT PLACEMENT;  Surgeon: Barbara Norleen, [name];  Location: [facility];  Service: Urology;  Laterality: Left;  1 HR FOR THIS CASE   DIRECT LARYNGOSCOPY WITH RADIAESSE INJECTION N/A 02/13/2018   Procedure: DIRECT LARYNGOSCOPY WITH RADIAESSE INJECTION;  Surgeon: Barbara Clark, [name];  Location: [facility] OR;  Service: ENT;  Laterality: N/A;   EYE SURGERY Left    CATARACT REMOVAL   HIP ARTHROPLASTY Left 09/27/2023   Procedure: ARTHROPLASTY BIPOLAR HIP (HEMIARTHROPLASTY);  Surgeon: Barbara Evalene JONETTA, [name];  Location: [facility] OR;  Service: Orthopedics;  Laterality: Left;   KNEE ARTHROSCOPY Left 01/06/2015   Procedure: LEFT KNEE ARTHROSCOPY, abrasion chondroplasty of the medial femerol condryl,medial and lateral menisectomy, microfracture , synovectomy of the suprpatellar pouch;  Surgeon: Barbara Heading, [name];  Location: [facility];  Service: Orthopedics;  Laterality: Left;   LEFT HEART CATH AND CORONARY ANGIOGRAPHY N/A 02/26/2019   Procedure: LEFT HEART CATH AND CORONARY ANGIOGRAPHY;  Surgeon: [name] Barbara LABOR, [name];  Location: [facility] INVASIVE CV LAB;  Service: Cardiovascular;  Laterality: N/A;   LEFT HEART CATH AND CORONARY ANGIOGRAPHY N/A 05/06/2022   Procedure: LEFT HEART CATH AND CORONARY ANGIOGRAPHY;  Surgeon: End,  Barbara [name];  Location: [facility] INVASIVE CV LAB;  Service: Cardiovascular;  Laterality: N/A;   MICROLARYNGOSCOPY W/VOCAL CORD INJECTION N/A 08/07/2018   Procedure: MICROLARYNGOSCOPY WITH VOCAL CORD INJECTION OF PROLARYN;  Surgeon: Barbara Clark, [name];  Location: [facility] OR;  Service: ENT;  Laterality: N/A;  JET VENTILATION   PACEMAKER IMPLANT N/A 05/08/2022   Procedure: PACEMAKER IMPLANT;  Surgeon: Barbara Soyla Lunger, [name];  Location: [facility];  Service: Cardiovascular;  Laterality: N/A;   REVERSE SHOULDER ARTHROPLASTY Right 03/23/2020   Procedure:  REVERSE SHOULDER ARTHROPLASTY;  Surgeon: Barbara [name];  Location: [facility] OR;  Service: Orthopedics;  Laterality: Right;   VAGINAL HYSTERECTOMY  1971    Allergies  Allergies  Allergen Reactions   Lidocaine  Anaphylaxis   Morphine  Other (See Comments)    Body shuts down   Procaine Hcl Anaphylaxis, Rash and Other (See Comments)    Anything with 'caine' in it    Sulfonamide Derivatives Hives   Amlodipine  Swelling   Norco [Hydrocodone -Acetaminophen ] Nausea And Vomiting and Other (See Comments)    States does ok with IV form    Tramadol  Nausea And Vomiting   Vytorin [Ezetimibe-Simvastatin] Other (See Comments)    Joint pain   Tape Itching and Other (See Comments)    Patient prefers either paper tape or Coban wrap    History of Present Illness     Barbara [name] has a PMH of HTN, HLD, paroxysmal atrial fibrillation, PPM, lower extremity edema, elevated LP(a), CKD stage IIIb, and CAD.  She underwent cardiac catheterization with PCI to her LAD with BMS 09/1999.  She underwent nuclear stress testing 11/13 which showed normal perfusion.  Echocardiogram 05/04/2022 showed an LVEF of greater than 75%, intermediate diastolic parameters, mildly elevated PA systolic pressure, moderately dilated left atria, moderate mitral valve regurgitation.  Cardiac MRI confirmed apical hypertrophy.  She was noted to have patchy late gadolinium enhancement at the apex and RV insertion site.  Her LGE accounted for 2% of total myocardial mass.  Genetic testing was recommended and plan for 1/24.  She was seen in follow-up by Dr. Burnard on 03/11/2023.  She had been seen and evaluated by Dr. Inocencio who interrogated her pacemaker which showed normal device function.  She reported fatigue and feeling tired.  She continued to intermittently use her cane with ambulation.  She reported that on checking her blood pressure with waking up it was 155-160 systolic.  She noted improvement with medication.  Her lower  extremity swelling improved after being off of amlodipine .  She continued to use her CPAP.  Compliance was verified.  She denied chest pain.  It was recommended that she increase her furosemide  to daily for help with her diastolic CHF and hypertension.  It was recommended that her hydralazine  be uptitrated to 100 mg twice daily if blood pressure remained elevated.  She contacted the nurse triage line on 07/29/2023.  She indicated that she had been seen by her PCP who recommended she follow-up with cardiology for further evaluation and management of her elevated blood pressure.  She presented to the clinic 08/04/2023 for follow-up evaluation and stated her blood pressure continued to be elevated.  She brought a blood pressure log which showed blood pressures in the 150s-160s over 80s.  We reviewed the importance of low-sodium diet and secondary causes of hypertension.  She expressed understanding.  She continued to be compliant with her CPAP.  Her EKG  showed a paced rhythm at 73 bpm.   I will increased  her hydralazine  to 100 mg twice daily and planned follow-up in 2 months.  She presented to the clinic 09/09/23 for follow-up evaluation and stated she felt that she was taking too many medications.  We reviewed her blood pressure.  It had been better controlled.  She misunderstood instructions for hydralazine  medication and was taking 75 mg in the morning and 200 mg at night.  She had been previously prescribed 50 mg tablets which she was using in the morning.  I reviewed dosing of her hydralazine .  I  prescribed 150 mg twice daily, asked her to maintain a blood pressure log, and planned follow-up in 2 to 3 months.  She presents to the clinic today for follow-up evaluation and states she continues to do physical therapy 2 times per day.  We reviewed her left hip fracture and her fall.  She presents using a walker today.  She has been tolerating physical therapy well.  Her blood pressure today is well-controlled  at 130/54.  Her pulse is 64.  She reports that she is wearing a life alert.  She also has some upper respiratory congestion.  I encouraged her to maintain her hydration and using Albina fire at night.  I will continue her current medical therapy and plan follow-up in 6 months.  Today she denies chest pain, shortness of breath, lower extremity edema, fatigue, palpitations, melena, hematuria, hemoptysis, diaphoresis, weakness, presyncope, syncope, orthopnea, and PND.   Home Medications    Prior to Admission medications   Medication Sig Start Date End Date Taking? Authorizing Provider  acetaminophen  (TYLENOL ) 500 MG tablet Take 500 mg by mouth daily as needed for headache.    Provider, Historical, [name]  amiodarone  (PACERONE ) 200 MG tablet Take 1 tablet (200 mg total) by mouth daily. 12/20/22   Burnard Barbara LABOR, [name]  apixaban  (ELIQUIS ) 2.5 MG TABS tablet Take 1 tablet by mouth twice daily 04/01/23   Burnard Barbara LABOR, [name]  aspirin  81 MG chewable tablet Chew 1 tablet (81 mg total) by mouth daily. 05/11/22   Singh, Prashant K, [name]  Blood Pressure Monitoring (BLOOD PRESSURE MONITOR AUTOMAT) DEVI 1 Units/min by Does not apply route daily. 04/17/23   Burnard Barbara LABOR, [name]  Cholecalciferol  (VITAMIN D3) 2000 units TABS Take 2,000 Units by mouth daily.    Provider, Historical, [name]  dextromethorphan  (DELSYM ) 30 MG/5ML liquid Take 15 mg by mouth daily as needed for cough.    Provider, Historical, [name]  docusate sodium  (COLACE) 100 MG capsule Take 100 mg by mouth as needed for mild constipation.    Provider, Historical, [name]  empagliflozin  (JARDIANCE ) 10 MG TABS tablet Take 1 tablet (10 mg total) by mouth daily. 09/03/22   Barbara Garnette LABOR, [name]  furosemide  (LASIX ) 20 MG tablet Take 1 tablet by mouth once daily 05/16/23   Barbara Garnette LABOR, [name]  hydrALAZINE  (APRESOLINE ) 50 MG tablet Take 1.5 tabs in the AM and take 2 tabs in the PM 07/09/23   Barbara Garnette LABOR, [name]  ipratropium (ATROVENT ) 0.06 % nasal spray Place 2 sprays into both nostrils 2  (two) times daily as needed (allergies). 04/28/18   Provider, Historical, [name]  isosorbide  mononitrate (IMDUR ) 30 MG 24 hr tablet Take 1 tablet (30 mg total) by mouth daily. 12/20/22   Burnard Barbara LABOR, [name]  ketoconazole  (NIZORAL ) 2 % cream APPLY 1 APPLICATION TOPICALLY TWO TIMES DAILY AS NEEDED FOR IRRITATION 10/03/22   Barbara Garnette LABOR, [name]  loratadine  (CLARITIN ) 10 MG tablet Take 10 mg by mouth daily as  needed for allergies.    Provider, Historical, [name]  LORazepam  (ATIVAN ) 0.5 MG tablet TAKE 1 TABLET BY MOUTH EVERY 6 HOURS AS NEEDED FOR ANXIETY 05/16/23   Barbara Garnette LABOR, [name]  metoprolol  succinate (TOPROL -XL) 100 MG 24 hr tablet Take 1 tablet (100 mg total) by mouth every evening. Take with or immediately following a meal. 07/09/23   Barbara Garnette LABOR, [name]  Multiple Vitamin (MULTIVITAMIN WITH MINERALS) TABS tablet Take 1 tablet by mouth daily.    Provider, Historical, [name]  nitroGLYCERIN  (NITROSTAT ) 0.4 MG SL tablet DISSOLVE ONE TABLET UNDER THE TONGUE EVERY 5 MINUTES AS NEEDED FOR CHEST PAIN.  DO NOT EXCEED A TOTAL OF 3 DOSES IN 15 MINUTES 03/27/23   Burnard Barbara LABOR, [name]  pantoprazole  (PROTONIX ) 40 MG tablet Take 1 tablet (40 mg total) by mouth daily. 12/20/22 12/21/23  Burnard Barbara LABOR, [name]  polyethylene glycol powder (GLYCOLAX /MIRALAX ) 17 GM/SCOOP powder Take 17 g by mouth daily. 05/10/22   Singh, Prashant K, [name]  potassium chloride  (KLOR-CON ) 10 MEQ tablet Take 1 tablet (10 mEq total) by mouth daily. 08/14/22   Barbara Garnette LABOR, [name]  Probiotic Product (PROBIOTIC GUMMIES PO) Take 2 tablets by mouth every morning.    Provider, Historical, [name]  rosuvastatin  (CRESTOR ) 40 MG tablet Take 1 tablet (40 mg total) by mouth daily. 11/11/22   Burnard Barbara LABOR, [name]  triamcinolone  (NASACORT) 55 MCG/ACT nasal inhaler Place 1 spray into both nostrils daily as needed (for allergies).  10/17/12   Barbara Garnette LABOR, [name]    Family History    Family History  Problem Relation Age of Onset   Cancer Maternal Grandmother    Heart disease Maternal  Grandfather    She indicated that her mother is deceased. She indicated that her father is deceased. She indicated that her maternal grandmother is deceased. She indicated that her maternal grandfather is deceased.  Social History    Social History   Socioeconomic History   Marital status: Widowed    Spouse name: Not on file   Number of children: 2   Years of education: college   Highest education level: High school graduate  Occupational History   Occupation: Retired  Tobacco Use   Smoking status: Former    Current packs/day: 0.00    Types: Cigarettes    Start date: 11/04/1956    Quit date: 11/04/1996    Years since quitting: 27.0   Smokeless tobacco: Never  Vaping Use   Vaping status: Never Used  Substance and Sexual Activity   Alcohol use: No    Alcohol/week: 0.0 standard drinks of alcohol   Drug use: No   Sexual activity: Not Currently  Other Topics Concern   Not on file  Social History Narrative   Not on file   Social Drivers of Health   Financial Resource Strain: Low Risk  (08/05/2022)   Overall Financial Resource Strain (CARDIA)    Difficulty of Paying Living Expenses: Not hard at all  Food Insecurity: No Food Insecurity (09/26/2023)   Hunger Vital Sign    Worried About Running Out of Food in the Last Year: Never true    Ran Out of Food in the Last Year: Never true  Transportation Needs: No Transportation Needs (09/26/2023)   PRAPARE - Administrator, Civil Service (Medical): No    Lack of Transportation (Non-Medical): No  Physical Activity: Insufficiently Active (07/02/2022)   Exercise Vital Sign    Days of Exercise per Week: 1 day  Minutes of Exercise per Session: 20 min  Stress: No Stress Concern Present (07/02/2022)   [employer] of Occupational Health - Occupational Stress Questionnaire    Feeling of Stress : Not at all  Social Connections: Moderately Integrated (07/02/2022)   Social Connection and Isolation Panel [NHANES]    Frequency  of Communication with Friends and Family: More than three times a week    Frequency of Social Gatherings with Friends and Family: More than three times a week    Attends Religious Services: More than 4 times per year    Active Member of [employer] or Organizations: Yes    Attends Banker Meetings: More than 4 times per year    Marital Status: Widowed  Intimate Partner Violence: Not At Risk (09/26/2023)   Humiliation, Afraid, Rape, and Kick questionnaire    Fear of Current or Ex-Partner: No    Emotionally Abused: No    Physically Abused: No    Sexually Abused: No     Review of Systems    General:  No chills, fever, night sweats or weight changes.  Cardiovascular:  No chest pain, dyspnea on exertion, edema, orthopnea, palpitations, paroxysmal nocturnal dyspnea. Dermatological: No rash, lesions/masses Respiratory: No cough, dyspnea Urologic: No hematuria, dysuria Abdominal:   No nausea, vomiting, diarrhea, bright red blood per rectum, melena, or hematemesis Neurologic:  No visual changes, wkns, changes in mental status. All other systems reviewed and are otherwise negative except as noted above.  Physical Exam    VS:  BP (!) 130/54 (BP Location: Left Arm, Patient Position: Sitting, Cuff Size: Normal)   Pulse 64   Ht 5' 7 (1.702 m)   Wt 148 lb (67.1 kg)   SpO2 97%   BMI 23.18 kg/m  , BMI Body mass index is 23.18 kg/m. GEN: Well nourished, well developed, in no acute distress. HEENT: normal. Neck: Supple, no JVD, carotid bruits, or masses. Cardiac: RRR, no murmurs, rubs, or gallops. No clubbing, cyanosis, bilateral lower extremity ankle edema nonpitting.  Radials/DP/PT 2+ and equal bilaterally.  Respiratory:  Respirations regular and unlabored, clear to auscultation bilaterally. GI: Soft, nontender, nondistended, BS + x 4. MS: no deformity or atrophy. Skin: warm and dry, no rash. Neuro:  Strength and sensation are intact. Psych: Normal affect.  Accessory Clinical  Findings    Recent Labs: 05/02/2023: TSH 2.170 07/11/2023: ALT 42 09/27/2023: BUN 22; Creatinine, Ser 1.58; Hemoglobin 10.4; Magnesium  1.8; Platelets 179; Potassium 3.7; Sodium 138   Recent Lipid Panel    Component Value Date/Time   CHOL 148 06/13/2021 1459   TRIG 244.0 (H) 06/13/2021 1459   HDL 57.60 06/13/2021 1459   CHOLHDL 3 06/13/2021 1459   VLDL 48.8 (H) 06/13/2021 1459   LDLCALC 50 10/08/2020 0313   LDLDIRECT 54.0 06/13/2021 1459         ECG personally reviewed by me today-   none today.  Cardiac catheterization 02/26/2019  Prox RCA to Mid RCA lesion is 15% stenosed. Ost LAD to Prox LAD lesion is 20% stenosed. Prox Cx lesion is 20% stenosed.   Mild non-obstructive coronary artery disease with a patent proximal LAD stent with mild 20% intimal hyperplasia (inserted  09/1999); 20% proximal left circumflex narrowing, and mild irregularity of 10 to 15% in the mid RCA.   Hyperdynamic LV function with an Ace of Spade configuration and near cavity obliteration in the mid to apical segment during systole.  There is evidence for left ventricular hypertrophy.  LVEDP is 16 mm.   RECOMMENDATION:  Medical therapy with optimal blood pressure control, lipid management, and resumption of Eliquis  tomorrow     CATH: 05/06/2022 Conclusions: Mild-moderate, non-obstructive coronary artery disease. Patent proximal/mid LAD stent with up to 30% in-stent restenosis. Low left ventricular filling pressure (LVEDP ~5 mmHg).   Recommendations: Continue secondary prevention of coronary artery disease. Proceed with pacemaker placement per Dr. Fernande.     Echocardiogram 05/04/2022  IMPRESSIONS     1. Left ventricular ejection fraction, by estimation, is >75%. The left  ventricle has hyperdynamic function. The left ventricle has no regional  wall motion abnormalities. Left ventricular diastolic parameters are  indeterminate. Elevated left atrial  pressure.   2. Right ventricular systolic  function is normal. The right ventricular  size is normal. There is mildly elevated pulmonary artery systolic  pressure.   3. Left atrial size was moderately dilated.   4. The mitral valve is normal in structure. Moderate mitral valve  regurgitation. No evidence of mitral stenosis.   5. The aortic valve is tricuspid. Aortic valve regurgitation is not  visualized. Aortic valve sclerosis is present, with no evidence of aortic  valve stenosis.   6. The inferior vena cava is normal in size with greater than 50%  respiratory variability, suggesting right atrial pressure of 3 mmHg.   FINDINGS   Left Ventricle: Left ventricular ejection fraction, by estimation, is  >75%. The left ventricle has hyperdynamic function. The left ventricle has  no regional wall motion abnormalities. Definity  contrast agent was given  IV to delineate the left  ventricular endocardial borders. The left ventricular internal cavity size  was normal in size. There is no left ventricular hypertrophy. Left  ventricular diastolic parameters are indeterminate. Elevated left atrial  pressure.   Right Ventricle: The right ventricular size is normal. Right ventricular  systolic function is normal. There is mildly elevated pulmonary artery  systolic pressure. The tricuspid regurgitant velocity is 3.14 m/s, and  with an assumed right atrial pressure  of 3 mmHg, the estimated right ventricular systolic pressure is 42.4 mmHg.   Left Atrium: Left atrial size was moderately dilated.   Right Atrium: Right atrial size was normal in size.   Pericardium: There is no evidence of pericardial effusion.   Mitral Valve: The mitral valve is normal in structure. Moderate mitral  valve regurgitation. No evidence of mitral valve stenosis. MV peak  gradient, 8.6 mmHg. The mean mitral valve gradient is 2.0 mmHg.   Tricuspid Valve: The tricuspid valve is normal in structure. Tricuspid  valve regurgitation is mild . No evidence of tricuspid  stenosis.   Aortic Valve: The aortic valve is tricuspid. Aortic valve regurgitation is  not visualized. Aortic valve sclerosis is present, with no evidence of  aortic valve stenosis.   Pulmonic Valve: The pulmonic valve was normal in structure. Pulmonic valve  regurgitation is trivial. No evidence of pulmonic stenosis.   Aorta: The aortic root is normal in size and structure.   Venous: The inferior vena cava is normal in size with greater than 50%  respiratory variability, suggesting right atrial pressure of 3 mmHg.   IAS/Shunts: No atrial level shunt detected by color flow Doppler.        Assessment & Plan   1.  Essential hypertension-BP today 130/54.  Tolerating hydralazine  150 mg twice daily well.   Maintain blood pressure log Continue hydralazine , metoprolol , Imdur   Coronary artery disease-no recent chest pain or anginal type symptoms.  Underwent cardiac catheterization 02/26/2020 which showed patent LAD stent and nonobstructive mild  coronary disease in her circumflex and RCA. Continue current medical therapy Maintain heart healthy diet  Paroxysmal atrial fibrillation-heart rate today 64.  Use compliance with apixaban  and denies bleeding issues.  No recent trauma or falls. Continue apixaban , amiodarone , metoprolol  Avoid triggers caffeine, chocolate, EtOH, dehydration etc.-reviewed  OSA-continues to be compliant with CPAP.   Continue CPAP use Avoid supine sleeping  Exertional dyspnea-breathing stable at baseline.  Continues to use cane with ambulation. Continue CPAP use Maintain/increase physical activity as tolerated Heart healthy low-sodium diet  Disposition: Follow-up with Dr. Burnard or me in 6 months.   Barbara HERO. Nana Hoselton NP-C     11/14/2023, 12:08 PM [facility] [address] Office [phone] Fax [phone]    I spent 14 minutes examining this patient, reviewing medications, and using patient centered shared  decision making involving her cardiac care.  Prior to her visit I spent greater than 20 minutes reviewing her past medical history,  medications, and prior cardiac tests.

## 2023-11-12 ENCOUNTER — Telehealth: Payer: Self-pay

## 2023-11-12 NOTE — Telephone Encounter (Signed)
 Copied from CRM [id number]. Topic: Clinical - Medical Advice >> Nov 12, 2023  2:02 PM Eleanor C wrote: Reason for CRM: Home Health Physical Therapist Calling- parameters previously set for weight were not met on January 6th and January 8th visit. Therapist would like to know if doctor would like to change parameters or keep as is. Please advise to therapist. [phone] [facility]

## 2023-11-13 NOTE — Telephone Encounter (Signed)
 Spoke to [place] and advised to call PCP for orders, aware we did not order PT.  He appreciates the return call and will reach out to PCP.

## 2023-11-13 NOTE — Telephone Encounter (Signed)
 FYI

## 2023-11-14 ENCOUNTER — Ambulatory Visit: Payer: PPO | Attending: General Practice | Admitting: General Practice

## 2023-11-14 ENCOUNTER — Encounter: Payer: Self-pay | Admitting: General Practice

## 2023-11-14 VITALS — BP 130/54 | HR 64 | Ht 67.0 in | Wt 148.0 lb

## 2023-11-14 DIAGNOSIS — R0609 Other forms of dyspnea: Secondary | ICD-10-CM

## 2023-11-14 DIAGNOSIS — I480 Paroxysmal atrial fibrillation: Secondary | ICD-10-CM | POA: Diagnosis not present

## 2023-11-14 DIAGNOSIS — G4733 Obstructive sleep apnea (adult) (pediatric): Secondary | ICD-10-CM

## 2023-11-14 DIAGNOSIS — I2510 Atherosclerotic heart disease of native coronary artery without angina pectoris: Secondary | ICD-10-CM | POA: Diagnosis not present

## 2023-11-14 DIAGNOSIS — I10 Essential (primary) hypertension: Secondary | ICD-10-CM | POA: Diagnosis not present

## 2023-11-14 NOTE — Telephone Encounter (Signed)
 Have Barbara [name] come see [place] next week so we can check for any fluid overload

## 2023-11-14 NOTE — Telephone Encounter (Signed)
 Appointment scheduled for 11/19/23 at 1.45 pm

## 2023-11-14 NOTE — Patient Instructions (Addendum)
 Medication Instructions:  Your physician recommends that you continue on your current medications as directed. Please refer to the Current Medication list given to you today.  *If you need a refill on your cardiac medications before your next appointment, please call your pharmacy*    Follow-Up: At [facility], you and your health needs are our priority.  As part of our continuing mission to provide you with exceptional heart care, we have created designated Provider Care Teams.  These Care Teams include your primary Cardiologist (physician) and Advanced Practice Providers (APPs -  Physician Assistants and Nurse Practitioners) who all work together to provide you with the care you need, when you need it.  We recommend signing up for the patient portal called MyChart.  Sign up information is provided on this After Visit Summary.  MyChart is used to connect with patients for Virtual Visits (Telemedicine).  Patients are able to view lab/test results, encounter notes, upcoming appointments, etc.  Non-urgent messages can be sent to your provider as well.   To learn more about what you can do with MyChart, go to [email or web address].    Your next appointment:   6 month(s)  Provider:   Dr. Lonni Nanas  or Josefa Beauvais, FNP     Other Instructions Increase hydration and you can use a humidifier as needed.

## 2023-11-17 ENCOUNTER — Telehealth: Payer: Self-pay

## 2023-11-17 DIAGNOSIS — G4733 Obstructive sleep apnea (adult) (pediatric): Secondary | ICD-10-CM | POA: Diagnosis not present

## 2023-11-17 NOTE — Telephone Encounter (Signed)
 Copied from CRM [id number]. Topic: Clinical - Home Health Verbal Orders >> Nov 17, 2023  9:32 AM Joanell NOVAK wrote: Caller/Agency: Sharyle from [employer]   Callback Number: [phone] Service Requested: Custodial Care  Any new concerns about the patient? No  Would like to confirm that the fax was received regarding needing prior authorization for a leg fracture, and the pt needing custodial care.

## 2023-11-19 ENCOUNTER — Encounter: Payer: Self-pay | Admitting: Family Medicine

## 2023-11-19 ENCOUNTER — Ambulatory Visit (INDEPENDENT_AMBULATORY_CARE_PROVIDER_SITE_OTHER): Payer: PPO | Admitting: Family Medicine

## 2023-11-19 VITALS — BP 118/58 | HR 62 | Temp 97.7°F | Wt 154.4 lb

## 2023-11-19 DIAGNOSIS — I2510 Atherosclerotic heart disease of native coronary artery without angina pectoris: Secondary | ICD-10-CM

## 2023-11-19 DIAGNOSIS — M25471 Effusion, right ankle: Secondary | ICD-10-CM

## 2023-11-19 DIAGNOSIS — M25472 Effusion, left ankle: Secondary | ICD-10-CM | POA: Insufficient documentation

## 2023-11-19 DIAGNOSIS — N1832 Chronic kidney disease, stage 3b: Secondary | ICD-10-CM | POA: Diagnosis not present

## 2023-11-19 DIAGNOSIS — I5032 Chronic diastolic (congestive) heart failure: Secondary | ICD-10-CM | POA: Diagnosis not present

## 2023-11-19 DIAGNOSIS — I480 Paroxysmal atrial fibrillation: Secondary | ICD-10-CM

## 2023-11-19 MED ORDER — FUROSEMIDE 40 MG PO TABS: 40.0000 mg | ORAL_TABLET | Freq: Every day | ORAL | 3 refills | Status: DC

## 2023-11-19 NOTE — Telephone Encounter (Signed)
 Pt custodial form was completed by Dr Alyne Babinski and faxed to the number provided. Spoke with Leshana to confirm that the fax was received, she stated that the form may have not been uploaded to pt file, advised to call the office back if any need to refax the form again

## 2023-11-19 NOTE — Progress Notes (Signed)
   Subjective:    Patient ID: Barbara [name], female    DOB: 23-May-1941, 83 y.o.   MRN: [id number]  HPI Here to discuss her fluid balance. She has been taking Lasix  20 mg every morning. She saw Cardiology on 11-14-23, and they were satisfied with how she was doing. However  since then she has gained 7 lbs, and her ankles are swelling more. The swelling does go down overnight. She denies any SOB. Her BP is stable.    Review of Systems  Constitutional: Negative.   Respiratory: Negative.    Cardiovascular:  Positive for leg swelling. Negative for chest pain and palpitations.       Objective:   Physical Exam Constitutional:      Appearance: Normal appearance.     Comments: In a wheelchair   Cardiovascular:     Rate and Rhythm: Normal rate. Rhythm irregular.     Pulses: Normal pulses.     Heart sounds: Normal heart sounds.  Pulmonary:     Effort: Pulmonary effort is normal.     Breath sounds: Normal breath sounds.  Musculoskeletal:     Comments: 1+ edema in right ankle and 2+ edema in left ankle   Neurological:     Mental Status: She is alert.           Assessment & Plan:  She has some fluid retention, but her HTN is stable. We will increase the Lasix  to 40 mg daily. She will see us  in 2 months. She is schedule for her firat visit to Nephrology in March. Corita Diego, MD

## 2023-11-21 DIAGNOSIS — S72042D Displaced fracture of base of neck of left femur, subsequent encounter for closed fracture with routine healing: Secondary | ICD-10-CM | POA: Diagnosis not present

## 2023-12-05 DIAGNOSIS — S7292XA Unspecified fracture of left femur, initial encounter for closed fracture: Secondary | ICD-10-CM | POA: Diagnosis not present

## 2023-12-12 ENCOUNTER — Ambulatory Visit: Payer: Self-pay | Admitting: Family Medicine

## 2023-12-12 DIAGNOSIS — S7292XA Unspecified fracture of left femur, initial encounter for closed fracture: Secondary | ICD-10-CM | POA: Diagnosis not present

## 2023-12-12 NOTE — Telephone Encounter (Signed)
 Copied from CRM [id number]. Topic: Clinical - Prescription Issue >> Dec 12, 2023 11:39 AM Isabell A wrote: Reason for CRM: Patient states she lost the box with the prescription number for her Ketoconazole  2% cream, requesting a new prescription sent to [facility].    Patient would also like to confirm for hydrALAZINE  (APRESOLINE ) 100 MG tablet why she only gets 45 tablets - states she has to go to the pharmacy every 15 days.

## 2023-12-13 ENCOUNTER — Other Ambulatory Visit: Payer: Self-pay | Admitting: Family Medicine

## 2023-12-15 ENCOUNTER — Other Ambulatory Visit: Payer: Self-pay | Admitting: Family Medicine

## 2023-12-18 DIAGNOSIS — G4733 Obstructive sleep apnea (adult) (pediatric): Secondary | ICD-10-CM | POA: Diagnosis not present

## 2023-12-19 DIAGNOSIS — S7292XA Unspecified fracture of left femur, initial encounter for closed fracture: Secondary | ICD-10-CM | POA: Diagnosis not present

## 2023-12-22 ENCOUNTER — Other Ambulatory Visit: Payer: Self-pay | Admitting: Family Medicine

## 2023-12-22 NOTE — Telephone Encounter (Signed)
Last Fill: Lorazepam: 05/16/23     Ketoconazole: 10/03/22     Hydralazine: 09/09/23  Last OV: 11/19/23 Next OV: 01/21/24  Routing to provider for review/authorization.

## 2023-12-22 NOTE — Progress Notes (Signed)
 Remote pacemaker transmission.

## 2023-12-22 NOTE — Telephone Encounter (Unsigned)
Copied from CRM [id number]. Topic: Clinical - Medication Refill >> Dec 22, 2023  4:04 PM Truddie Crumble wrote: Most Recent Primary Care Visit:  Provider: Gershon Crane A  Department: [facility]  Visit Type: OFFICE VISIT  Date: 11/19/2023  Medication: LORazepam, Ketoconazole and hydralazine  Has the patient contacted their pharmacy? Yes (Agent: If no, request that the patient contact the pharmacy for the refill. If patient does not wish to contact the pharmacy document the reason why and proceed with request.) (Agent: If yes, when and what did the pharmacy advise?)  Is this the correct pharmacy for this prescription? Yes If no, delete pharmacy and type the correct one.  This is the patient's preferred pharmacy:  [facility] [address] [place], [place] - [address] [place] [address] Phone: [phone] Fax: [phone]   Has the prescription been filled recently? No  Is the patient out of the medication? Yes  Has the patient been seen for an appointment in the last year OR does the patient have an upcoming appointment? Yes  Can we respond through MyChart? Yes  Agent: Please be advised that Rx refills may take up to 3 business days. We ask that you follow-up with your pharmacy.

## 2023-12-22 NOTE — Addendum Note (Signed)
Addended by: Geralyn Flash D on: 12/22/2023 10:04 AM   Modules accepted: Orders

## 2023-12-22 NOTE — Telephone Encounter (Signed)
Copied from CRM [id number]. Topic: Clinical - Medication Refill >> Dec 22, 2023  4:04 PM Truddie Crumble wrote: Most Recent Primary Care Visit:  Provider: Gershon Crane A  Department: [facility]  Visit Type: OFFICE VISIT  Date: 11/19/2023  Medication: LORazepam, Ketoconazole and hydralazine  Has the patient contacted their pharmacy? Yes (Agent: If no, request that the patient contact the pharmacy for the refill. If patient does not wish to contact the pharmacy document the reason why and proceed with request.) (Agent: If yes, when and what did the pharmacy advise?)  Is this the correct pharmacy for this prescription? Yes If no, delete pharmacy and type the correct one.  This is the patient's preferred pharmacy:  [facility] [address] [place], [place] - [address] [place] [address] Phone: [phone] Fax: [phone]   Has the prescription been filled recently? No  Is the patient out of the medication? Yes  Has the patient been seen for an appointment in the last year OR does the patient have an upcoming appointment? Yes  Can we respond through MyChart? Yes  Agent: Please be advised that Rx refills may take up to 3 business days. We ask that you follow-up with your pharmacy.

## 2023-12-23 NOTE — Telephone Encounter (Signed)
Sent to Dr. Clent Ridges for approval

## 2023-12-24 MED ORDER — HYDRALAZINE HCL 100 MG PO TABS: 150.0000 mg | ORAL_TABLET | Freq: Two times a day (BID) | ORAL | 3 refills | Status: DC

## 2023-12-24 MED ORDER — LORAZEPAM 0.5 MG PO TABS: 0.5000 mg | ORAL_TABLET | Freq: Four times a day (QID) | ORAL | 5 refills | Status: DC | PRN

## 2023-12-25 ENCOUNTER — Telehealth: Payer: Self-pay | Admitting: Pharmacy Technician

## 2023-12-25 ENCOUNTER — Other Ambulatory Visit (HOSPITAL_COMMUNITY): Payer: Self-pay

## 2023-12-25 NOTE — Telephone Encounter (Signed)
Pharmacy Patient Advocate Encounter  Received notification from [facility] [employer] that Prior Authorization for LORazepam 0.5MG  tablets  has been APPROVED from 12/25/2023 to 11/03/2024. Ran test claim, Copay is $0.00. This test claim was processed through [facility]- copay amounts may vary at other pharmacies due to pharmacy/plan contracts, or as the patient moves through the different stages of their insurance plan.   PA #/Case ID/Reference #: [id number]

## 2023-12-25 NOTE — Telephone Encounter (Signed)
Pharmacy Patient Advocate Encounter   Received notification from CoverMyMeds that prior authorization for LORazepam 0.5MG  tablets is required/requested.   Insurance verification completed.   The patient is insured through [facility] [employer]/RX ADVANCE .   Per test claim: PA required; PA submitted to above mentioned insurance via CoverMyMeds Key/confirmation #/EOC [id number] Status is pending

## 2023-12-26 DIAGNOSIS — S7292XA Unspecified fracture of left femur, initial encounter for closed fracture: Secondary | ICD-10-CM | POA: Diagnosis not present

## 2023-12-26 MED ORDER — HYDRALAZINE HCL 100 MG PO TABS: 150.0000 mg | ORAL_TABLET | Freq: Two times a day (BID) | ORAL | 11 refills | Status: DC

## 2023-12-26 MED ORDER — KETOCONAZOLE 2 % EX CREA: 1.0000 | TOPICAL_CREAM | Freq: Two times a day (BID) | CUTANEOUS | 5 refills | Status: AC | PRN

## 2023-12-26 NOTE — Telephone Encounter (Signed)
I changed the Hydralazine for her to get #90 at a time

## 2023-12-26 NOTE — Addendum Note (Signed)
Addended by: Gershon Crane A on: 12/26/2023 03:55 PM   Modules accepted: Orders

## 2023-12-29 ENCOUNTER — Other Ambulatory Visit: Payer: Self-pay | Admitting: *Deleted

## 2023-12-29 DIAGNOSIS — I480 Paroxysmal atrial fibrillation: Secondary | ICD-10-CM

## 2023-12-29 DIAGNOSIS — Z79899 Other long term (current) drug therapy: Secondary | ICD-10-CM

## 2023-12-31 NOTE — Telephone Encounter (Signed)
Spoke with pt verbalized understanding 

## 2024-01-01 DIAGNOSIS — H40023 Open angle with borderline findings, high risk, bilateral: Secondary | ICD-10-CM | POA: Diagnosis not present

## 2024-01-01 DIAGNOSIS — H04123 Dry eye syndrome of bilateral lacrimal glands: Secondary | ICD-10-CM | POA: Diagnosis not present

## 2024-01-02 DIAGNOSIS — S7292XA Unspecified fracture of left femur, initial encounter for closed fracture: Secondary | ICD-10-CM | POA: Diagnosis not present

## 2024-01-03 DIAGNOSIS — G4733 Obstructive sleep apnea (adult) (pediatric): Secondary | ICD-10-CM | POA: Diagnosis not present

## 2024-01-04 ENCOUNTER — Other Ambulatory Visit: Payer: Self-pay | Admitting: General Practice

## 2024-01-05 ENCOUNTER — Telehealth: Payer: Self-pay | Admitting: Family Medicine

## 2024-01-05 NOTE — Telephone Encounter (Unsigned)
 Copied from CRM [id number]. Topic: Clinical - Medication Refill >> Jan 05, 2024  9:20 AM Mackie Pai E wrote: Most Recent Primary Care Visit:  Provider: Gershon Crane A  Department: [facility]  Visit Type: OFFICE VISIT  Date: 11/19/2023  Medication: hydrALAZINE (APRESOLINE) 100 MG tablet  Has the patient contacted their pharmacy? Yes (Agent: If no, request that the patient contact the pharmacy for the refill. If patient does not wish to contact the pharmacy document the reason why and proceed with request.) (Agent: If yes, when and what did the pharmacy advise?)  Is this the correct pharmacy for this prescription? Yes If no, delete pharmacy and type the correct one.  This is the patient's preferred pharmacy:  [facility] [address] [place], [place] - [address] [place] [address] Phone: [phone] Fax: [phone]   Has the prescription been filled recently? No  Is the patient out of the medication? Yes  Has the patient been seen for an appointment in the last year OR does the patient have an upcoming appointment? Yes  Can we respond through MyChart? Yes  Agent: Please be advised that Rx refills may take up to 3 business days. We ask that you follow-up with your pharmacy.

## 2024-01-05 NOTE — Telephone Encounter (Signed)
 Prescription for Hydralazine was sent the pharmacy on 12/26/2023. Confirmation of receipt by pharmacy per chart review.

## 2024-01-06 DIAGNOSIS — E785 Hyperlipidemia, unspecified: Secondary | ICD-10-CM | POA: Diagnosis not present

## 2024-01-06 DIAGNOSIS — I129 Hypertensive chronic kidney disease with stage 1 through stage 4 chronic kidney disease, or unspecified chronic kidney disease: Secondary | ICD-10-CM | POA: Diagnosis not present

## 2024-01-06 DIAGNOSIS — I509 Heart failure, unspecified: Secondary | ICD-10-CM | POA: Diagnosis not present

## 2024-01-06 DIAGNOSIS — N1832 Chronic kidney disease, stage 3b: Secondary | ICD-10-CM | POA: Diagnosis not present

## 2024-01-06 DIAGNOSIS — N200 Calculus of kidney: Secondary | ICD-10-CM | POA: Diagnosis not present

## 2024-01-08 ENCOUNTER — Telehealth: Payer: Self-pay | Admitting: Family Medicine

## 2024-01-08 DIAGNOSIS — N1832 Chronic kidney disease, stage 3b: Secondary | ICD-10-CM | POA: Diagnosis not present

## 2024-01-08 NOTE — Telephone Encounter (Signed)
 Copied from CRM [id number]. Topic: Clinical - Prescription Issue >> Jan 08, 2024  3:35 PM Theodis Sato wrote: Reason for CRM: Patient states her pharmacy is denying having refills for both her hydrALAZINE (APRESOLINE) 100 MG tablet and metoprolol succinate (TOPROL-XL) 100 MG 24 hr tablet - Advised patient to call her pharmacy as both of the prescriptions have valid refills but she states they are denying this.

## 2024-01-09 MED ORDER — METOPROLOL SUCCINATE ER 100 MG PO TB24: 100.0000 mg | ORAL_TABLET | Freq: Every day | ORAL | 11 refills | Status: DC

## 2024-01-09 NOTE — Addendum Note (Signed)
 Addended by: Gershon Crane A on: 01/09/2024 07:54 AM   Modules accepted: Orders

## 2024-01-09 NOTE — Telephone Encounter (Signed)
 I refilled the Hydralazine. The Metoprolol was refilled a few weeks ago

## 2024-01-09 NOTE — Telephone Encounter (Signed)
 Attempted to notify pt but no success speaking with pt will call back later

## 2024-01-14 ENCOUNTER — Ambulatory Visit: Payer: PPO | Admitting: Podiatry

## 2024-01-14 ENCOUNTER — Encounter: Payer: Self-pay | Admitting: Podiatry

## 2024-01-14 VITALS — Ht 67.0 in | Wt 154.4 lb

## 2024-01-14 DIAGNOSIS — B351 Tinea unguium: Secondary | ICD-10-CM | POA: Diagnosis not present

## 2024-01-14 DIAGNOSIS — M79674 Pain in right toe(s): Secondary | ICD-10-CM

## 2024-01-14 DIAGNOSIS — M79675 Pain in left toe(s): Secondary | ICD-10-CM

## 2024-01-14 NOTE — Progress Notes (Signed)
  Subjective:  Patient ID: Barbara [name], female    DOB: [birth year],  MRN: [id number]  Barbara [name] presents to clinic today for: painful, elongated thickened toenails x 10 which are symptomatic when wearing enclosed shoe gear. This interferes with his/her daily activities. Patient states she had a fall last year and was at Rehab at [facility]. She is back home now. Chief Complaint  Patient presents with   Nail Problem    Pt is here for [facility] PCP is Dr Clent Ridges and LOV was in January.    PCP is [name], MD.  Allergies  Allergen Reactions   Lidocaine Anaphylaxis   Morphine Other (See Comments)    "Body shuts down"   Procaine Hcl Anaphylaxis, Rash and Other (See Comments)    "Anything with 'caine' in it "   Sulfonamide Derivatives Hives   Amlodipine Swelling   Norco [Hydrocodone-Acetaminophen] Nausea And Vomiting and Other (See Comments)    States does ok with IV form    Tramadol Nausea And Vomiting   Vytorin [Ezetimibe-Simvastatin] Other (See Comments)    Joint pain   Tape Itching and Other (See Comments)    Patient prefers either paper tape or Coban wrap    Review of Systems: Negative except as noted in the HPI.  Objective: No changes noted in today's physical examination. There were no vitals filed for this visit.  Barbara [name] is a pleasant 83 y.o. female WD, WN in NAD. AAO x 3.  Vascular Examination: Capillary refill time <3 seconds b/l LE. Palpable pedal pulses b/l LE. Digital hair present b/l. No pedal edema b/l. Skin temperature gradient WNL b/l. No varicosities b/l. No cyanosis or clubbing noted b/l LE.Marland Kitchen  Dermatological Examination: Pedal skin with normal turgor, texture and tone b/l. No open wounds. No interdigital macerations b/l. Toenails 1-5 b/l thickened, discolored, dystrophic with subungual debris. There is pain on palpation to dorsal aspect of nailplates. No corns, calluses nor porokeratotic lesions noted..  Neurological  Examination: Protective sensation intact with 10 gram monofilament b/l LE. Vibratory sensation intact b/l LE.   Musculoskeletal Examination: Muscle strength 5/5 to all lower extremity muscle groups bilaterally. No pain, crepitus or joint limitation noted with ROM bilateral LE. No gross bony deformities bilaterally. Utilizes cane for ambulation assistance.  Assessment/Plan: 1. Pain due to onychomycosis of toenails of both feet     Patient was evaluated and treated. All patient's and/or POA's questions/concerns addressed on today's visit. Toenails 1-5 debrided in length and girth without incident. Continue soft, supportive shoe gear daily. Report any pedal injuries to medical professional. Call office if there are any questions/concerns. -Patient/POA to call should there be question/concern in the interim.   Return in about 3 months (around 04/15/2024).  Freddie Breech, DPM      [place] LOCATION: [address], [place] [address]                   Office [phone]   [facility] LOCATION: [address] [place], [place] [address] Office [phone]

## 2024-01-14 NOTE — Telephone Encounter (Signed)
 Spoke with pt states that she picked up Rx from her pharmacy

## 2024-01-15 DIAGNOSIS — G4733 Obstructive sleep apnea (adult) (pediatric): Secondary | ICD-10-CM | POA: Diagnosis not present

## 2024-01-16 DIAGNOSIS — S7292XA Unspecified fracture of left femur, initial encounter for closed fracture: Secondary | ICD-10-CM | POA: Diagnosis not present

## 2024-01-19 ENCOUNTER — Encounter: Payer: Self-pay | Admitting: Podiatry

## 2024-01-21 ENCOUNTER — Encounter: Payer: Self-pay | Admitting: Family Medicine

## 2024-01-21 ENCOUNTER — Ambulatory Visit (INDEPENDENT_AMBULATORY_CARE_PROVIDER_SITE_OTHER): Payer: PPO | Admitting: Family Medicine

## 2024-01-21 VITALS — BP 120/58 | HR 53 | Temp 99.0°F | Wt 139.4 lb

## 2024-01-21 DIAGNOSIS — I480 Paroxysmal atrial fibrillation: Secondary | ICD-10-CM

## 2024-01-21 DIAGNOSIS — N1832 Chronic kidney disease, stage 3b: Secondary | ICD-10-CM | POA: Diagnosis not present

## 2024-01-21 DIAGNOSIS — M25472 Effusion, left ankle: Secondary | ICD-10-CM | POA: Diagnosis not present

## 2024-01-21 DIAGNOSIS — M25471 Effusion, right ankle: Secondary | ICD-10-CM

## 2024-01-21 DIAGNOSIS — I5032 Chronic diastolic (congestive) heart failure: Secondary | ICD-10-CM | POA: Diagnosis not present

## 2024-01-21 DIAGNOSIS — I10 Essential (primary) hypertension: Secondary | ICD-10-CM | POA: Diagnosis not present

## 2024-01-21 MED ORDER — FUROSEMIDE 40 MG PO TABS: 20.0000 mg | ORAL_TABLET | Freq: Every day | ORAL | Status: DC

## 2024-01-21 MED ORDER — APIXABAN 2.5 MG PO TABS: 2.5000 mg | ORAL_TABLET | Freq: Two times a day (BID) | ORAL | 3 refills | Status: AC

## 2024-01-21 MED ORDER — EMPAGLIFLOZIN 10 MG PO TABS: 10.0000 mg | ORAL_TABLET | Freq: Every day | ORAL | 3 refills | Status: AC

## 2024-01-21 NOTE — Progress Notes (Signed)
   Subjective:    Patient ID: Barbara [name], female    DOB: 12/20/1940, 83 y.o.   MRN: [id number]  HPI Here to follow up on HTN, CHF, and ankle edema. At our last visit we increased the Lasix to 40 mg daily. Since then she has lost weight and her ankle edema has almost resolved. She weighed 154 lbs at the last visit. She saw Dr. Glenna Fellows for a nephrology visit on 01-06-24, and she was pleased with her BP and fluid balance. Her potasisum was low at 3.1, and Venera admits to stopping her potassium pills because they were too large to swallow. Therefore Dr. Glenna Fellows gave her a list of dietary sources of potassium to see if that will get the value up. She will see her again in 4 weeks.    Review of Systems  Constitutional:  Positive for fatigue.  Respiratory:  Negative for shortness of breath.   Cardiovascular: Negative.        Objective:   Physical Exam Constitutional:      Comments: In a wheelchair   Cardiovascular:     Rate and Rhythm: Normal rate. Rhythm irregular.     Pulses: Normal pulses.     Heart sounds: Normal heart sounds.  Pulmonary:     Effort: Pulmonary effort is normal.     Breath sounds: Normal breath sounds.  Musculoskeletal:     Comments: Trace edema in both ankles   Neurological:     Mental Status: She is alert.           Assessment & Plan:  Her HTN and CHF are now stable. Here ankle edema has resolved. We ill decrease the Lasix back to 20 mg daily. She will follow up with Dr. Glenna Fellows as above.  Gershon Crane, MD

## 2024-01-30 ENCOUNTER — Telehealth: Payer: Self-pay

## 2024-01-30 NOTE — Progress Notes (Signed)
   01/30/2024  Patient ID: Barbara [name], female   DOB: 1940/12/29, 83 y.o.   MRN: [id number]  Pharmacy Quality Measure Review  This patient is appearing on a report for being at risk of failing the adherence measure for diabetes medications this calendar year. (Patient not diabetic, taking jardiance for HF/CKD)  Medication: Jardiance Last fill date: 01/21/24 for 90 day supply  Insurance report was not up to date. No action needed at this time.  Confirmed with patient she picked up, has and is taking with no cost concerns.  Sherrill Raring, PharmD Clinical Pharmacist [phone]

## 2024-02-07 NOTE — Progress Notes (Unsigned)
 Electrophysiology Office Note:   Date:  02/10/2024  ID:  Barbara [name], DOB [birth year], MRN [id number]  Primary Cardiologist: Nicki Guadalajara, MD Primary Heart Failure: None Electrophysiologist: Will Jorja Loa, MD      History of Present Illness:   Barbara [name] is a 83 y.o. female with h/o SND / tachy-brady syndrome s/p PPM, AF, HTN, HLD, CAD, OSA seen today for routine electrophysiology followup.   Since last being seen in our clinic the patient reports she feels tired a lot during the day.  She reports she has stopped taking her Lasix this past week as she thinks that she has been losing a pound per day.  She and her family report approximately 15 pound weight loss since the beginning 09/2023 after her hip fracture and SNF stay for rehab.    She denies chest pain, palpitations, dyspnea, PND, orthopnea, nausea, vomiting, dizziness, syncope, edema, weight gain, or early satiety.   Review of systems complete and found to be negative unless listed in HPI.   EP Information / Studies Reviewed:    EKG is not ordered today. EKG from 09/26/2023 reviewed which showed APVS at 60 bpm  EKG Interpretation Date/Time:  Tuesday February 10 2024 13:38:09 EDT Ventricular Rate:  64 PR Interval:  292 QRS Duration:  90 QT Interval:  422 QTC Calculation: 435 R Axis:   31  Text Interpretation: Atrial-paced rhythm with prolonged AV conduction Confirmed by Canary Brim ([id number]) on 02/10/2024 1:49:48 PM   PPM Interrogation-  reviewed in detail today,  See PACEART report.  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 The patient's score is based upon: CHF History: 0 HTN History: 1 Diabetes History: 0 Stroke History: 0 Vascular Disease History: 1 Age Score: 2 Gender Score: 1             Physical Exam:   VS:  BP 130/64   Pulse 64   Ht 5\' 8"  (1.727 m)   Wt 140 lb 3.2 oz (63.6 kg)   SpO2 99%   BMI 21.32 kg/m    Wt Readings from Last 3 Encounters:  02/10/24 140 lb 3.2 oz (63.6 kg)  01/21/24 139 lb 6.4 oz (63.2 kg)  01/14/24 154 lb 6.4 oz (70 kg)     GEN: Well nourished, well developed in no acute distress NECK: No JVD; No carotid bruits CARDIAC: Regular rate and rhythm, no murmurs, rubs, gallops RESPIRATORY:  Clear to auscultation without rales, wheezing or rhonchi  ABDOMEN: Soft, non-tender, non-distended EXTREMITIES:  No edema; No deformity   ASSESSMENT AND PLAN:    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  Patient instructed to call if no resolution of symptoms. -<0.1% AF on device, V rates well controlled  > EGM review shows longest and fastest episode lasting 4 minutes 30 seconds  and appears to be AT with far field oversensing -continue Toprol 100 mg daily   Secondary Hypercoagulable State  -continue Eliquis 2.5mg  BID, dose reviewed and appropriate by age / Cr   CAD  LAD stent  -no anginal symptoms   Apical Hypertrophic Cardiomyopathy  Low LGE burden  -per Cardiology   Hypertension  -well controlled on current regimen    OSA  -CPAP compliance encouraged   Weight Loss  -Patient encouraged to add Ensure or similar to diet for supplementation given weight loss   Disposition:   Follow up with Dr. Elberta Fortis in 12 months  Signed, Canary Brim, NP-C,  AGACNP-BC  [facility] - Electrophysiology  02/10/2024, 6:16 PM

## 2024-02-09 ENCOUNTER — Ambulatory Visit (INDEPENDENT_AMBULATORY_CARE_PROVIDER_SITE_OTHER): Payer: Medicare Other

## 2024-02-09 DIAGNOSIS — I495 Sick sinus syndrome: Secondary | ICD-10-CM

## 2024-02-09 DIAGNOSIS — I480 Paroxysmal atrial fibrillation: Secondary | ICD-10-CM

## 2024-02-10 ENCOUNTER — Ambulatory Visit: Attending: Cardiology | Admitting: Pulmonary Disease

## 2024-02-10 ENCOUNTER — Encounter: Payer: Self-pay | Admitting: Pulmonary Disease

## 2024-02-10 VITALS — BP 130/64 | HR 64 | Ht 68.0 in | Wt 140.2 lb

## 2024-02-10 DIAGNOSIS — I10 Essential (primary) hypertension: Secondary | ICD-10-CM | POA: Diagnosis not present

## 2024-02-10 DIAGNOSIS — D6869 Other thrombophilia: Secondary | ICD-10-CM | POA: Diagnosis not present

## 2024-02-10 DIAGNOSIS — I480 Paroxysmal atrial fibrillation: Secondary | ICD-10-CM

## 2024-02-10 DIAGNOSIS — I495 Sick sinus syndrome: Secondary | ICD-10-CM | POA: Diagnosis not present

## 2024-02-10 DIAGNOSIS — G4733 Obstructive sleep apnea (adult) (pediatric): Secondary | ICD-10-CM

## 2024-02-10 LAB — CUP PACEART INCLINIC DEVICE CHECK
Date Time Interrogation Session: 20250408181921
Implantable Lead Connection Status: 753985
Implantable Lead Connection Status: 753985
Implantable Lead Implant Date: 20230705
Implantable Lead Implant Date: 20230705
Implantable Lead Location: 753859
Implantable Lead Location: 753860
Implantable Lead Model: 3830
Implantable Lead Model: 5076
Implantable Pulse Generator Implant Date: 20230705

## 2024-02-10 LAB — CUP PACEART REMOTE DEVICE CHECK
Battery Remaining Longevity: 137 mo
Battery Voltage: 3.04 V
Brady Statistic AP VP Percent: 0.04 %
Brady Statistic AP VS Percent: 99.57 %
Brady Statistic AS VP Percent: 0 %
Brady Statistic AS VS Percent: 0.39 %
Brady Statistic RA Percent Paced: 99.58 %
Brady Statistic RV Percent Paced: 0.04 %
Date Time Interrogation Session: 20250408011237
Implantable Lead Connection Status: 753985
Implantable Lead Connection Status: 753985
Implantable Lead Implant Date: 20230705
Implantable Lead Implant Date: 20230705
Implantable Lead Location: 753859
Implantable Lead Location: 753860
Implantable Lead Model: 3830
Implantable Lead Model: 5076
Implantable Pulse Generator Implant Date: 20230705
Lead Channel Impedance Value: 285 Ohm
Lead Channel Impedance Value: 323 Ohm
Lead Channel Impedance Value: 380 Ohm
Lead Channel Impedance Value: 456 Ohm
Lead Channel Pacing Threshold Amplitude: 0.875 V
Lead Channel Pacing Threshold Amplitude: 1.25 V
Lead Channel Pacing Threshold Pulse Width: 0.4 ms
Lead Channel Pacing Threshold Pulse Width: 0.4 ms
Lead Channel Sensing Intrinsic Amplitude: 1.25 mV
Lead Channel Sensing Intrinsic Amplitude: 1.25 mV
Lead Channel Sensing Intrinsic Amplitude: 14.875 mV
Lead Channel Sensing Intrinsic Amplitude: 14.875 mV
Lead Channel Setting Pacing Amplitude: 1.75 V
Lead Channel Setting Pacing Amplitude: 2.5 V
Lead Channel Setting Pacing Pulse Width: 0.4 ms
Lead Channel Setting Sensing Sensitivity: 0.9 mV
Zone Setting Status: 755011
Zone Setting Status: 755011

## 2024-02-10 MED ORDER — FUROSEMIDE 20 MG PO TABS: 20.0000 mg | ORAL_TABLET | Freq: Every day | ORAL | 3 refills | Status: DC | PRN

## 2024-02-10 NOTE — Patient Instructions (Addendum)
 Medication Instructions:  Take lasix 20 mg daily as needed for lower extremity swelling or a weight gain of 2.5 lbs or more in 3 days or 5 lbs or more in a week. *If you need a refill on your cardiac medications before your next appointment, please call your pharmacy*  Lab Work: LFT'S-TODAY If you have labs (blood work) drawn today and your tests are completely normal, you will receive your results only by: MyChart Message (if you have MyChart) OR A paper copy in the mail If you have any lab test that is abnormal or we need to change your treatment, we will call you to review the results.  Follow-Up: At [facility], you and your health needs are our priority.  As part of our continuing mission to provide you with exceptional heart care, our providers are all part of one team.  This team includes your primary Cardiologist (physician) and Advanced Practice Providers or APPs (Physician Assistants and Nurse Practitioners) who all work together to provide you with the care you need, when you need it.  Your next appointment:   1 year(s)  Provider:   You may see Will Jorja Loa, MD or one of the following Advanced Practice Providers on your designated Care Team:   Francis Dowse, [place] Casimiro Needle "[place]" Preston, [place] Canary Brim, NP   Other Instructions Start supplementing your diet with ensure or boost shakes daily as discussed.       1st Floor: - Lobby - Registration  - Pharmacy  - Lab - Cafe  2nd Floor: - PV Lab - Diagnostic Testing (echo, CT, nuclear med)  3rd Floor: - Vacant  4th Floor: - TCTS (cardiothoracic surgery) - AFib Clinic - Structural Heart Clinic - Vascular Surgery  - Vascular Ultrasound  5th Floor: - HeartCare Cardiology (general and EP) - Clinical Pharmacy for coumadin, hypertension, lipid, weight-loss medications, and med management appointments    Valet parking services will be available as well.

## 2024-02-11 ENCOUNTER — Other Ambulatory Visit: Payer: Self-pay

## 2024-02-11 LAB — HEPATIC FUNCTION PANEL
ALT: 10 IU/L (ref 0–32)
AST: 21 IU/L (ref 0–40)
Albumin: 4.1 g/dL (ref 3.7–4.7)
Alkaline Phosphatase: 94 IU/L (ref 44–121)
Bilirubin Total: 0.6 mg/dL (ref 0.0–1.2)
Bilirubin, Direct: 0.22 mg/dL (ref 0.00–0.40)
Total Protein: 7.2 g/dL (ref 6.0–8.5)

## 2024-02-12 ENCOUNTER — Other Ambulatory Visit: Payer: Self-pay | Admitting: Family Medicine

## 2024-02-12 NOTE — Telephone Encounter (Signed)
 Copied from CRM [id number]. Topic: Clinical - Medication Refill >> Feb 12, 2024 10:29 AM Truddie Crumble wrote: Most Recent Primary Care Visit:  Provider: Gershon Crane A  Department: [facility]  Visit Type: OFFICE VISIT  Date: 01/21/2024  Medication: pantoprazole (PROTONIX) 40 MG tablet  Has the patient contacted their pharmacy? Yes (Agent: If no, request that the patient contact the pharmacy for the refill. If patient does not wish to contact the pharmacy document the reason why and proceed with request.) (Agent: If yes, when and what did the pharmacy advise?)  Is this the correct pharmacy for this prescription? Yes If no, delete pharmacy and type the correct one.  This is the patient's preferred pharmacy:  [facility] [address] [place], [place] - [address] [place] [address] Phone: [phone] Fax: [phone]   Has the prescription been filled recently? No  Is the patient out of the medication? Yes  Has the patient been seen for an appointment in the last year OR does the patient have an upcoming appointment? Yes  Can we respond through MyChart? Yes  Agent: Please be advised that Rx refills may take up to 3 business days. We ask that you follow-up with your pharmacy.

## 2024-02-13 MED ORDER — PANTOPRAZOLE SODIUM 40 MG PO TBEC: 40.0000 mg | DELAYED_RELEASE_TABLET | Freq: Every day | ORAL | 3 refills | Status: AC

## 2024-02-15 DIAGNOSIS — G4733 Obstructive sleep apnea (adult) (pediatric): Secondary | ICD-10-CM | POA: Diagnosis not present

## 2024-02-18 ENCOUNTER — Telehealth: Payer: Self-pay

## 2024-02-18 ENCOUNTER — Other Ambulatory Visit: Payer: Self-pay

## 2024-02-18 NOTE — Patient Outreach (Signed)
 Aging Gracefully Program  02/18/2024  Barbara [name] 08/16/1941 [id number]   [facility] Evaluation Interviewer made contact with patient. Aging Gracefully survey completed.     [name]  Population Health Care Management Assistant  Direct Dial: [phone]  Fax: [phone] Website: [name]

## 2024-02-19 DIAGNOSIS — N1832 Chronic kidney disease, stage 3b: Secondary | ICD-10-CM | POA: Diagnosis not present

## 2024-02-20 ENCOUNTER — Other Ambulatory Visit: Payer: Self-pay | Admitting: Family Medicine

## 2024-02-20 NOTE — Telephone Encounter (Signed)
 Copied from CRM [id number]. Topic: Clinical - Medication Refill >> Feb 20, 2024  2:05 PM Orien Bird wrote: Most Recent Primary Care Visit:  Provider: Corita Diego A  Department: [facility]  Visit Type: OFFICE VISIT  Date: 01/21/2024  Medication: isosorbide  mononitrate (IMDUR ) 30 MG 24 hr tablet  Has the patient contacted their pharmacy? Yes (Agent: If no, request that the patient contact the pharmacy for the refill. If patient does not wish to contact the pharmacy document the reason why and proceed with request.) (Agent: If yes, when and what did the pharmacy advise?)  Is this the correct pharmacy for this prescription? Yes If no, delete pharmacy and type the correct one.  This is the patient's preferred pharmacy:  [facility] [address] [place], [place] - [address] [place] [address] Phone: [phone] Fax: [phone]   Has the prescription been filled recently? No  Is the patient out of the medication? Yes  Has the patient been seen for an appointment in the last year OR does the patient have an upcoming appointment? Yes  Can we respond through MyChart? No  Agent: Please be advised that Rx refills may take up to 3 business days. We ask that you follow-up with your pharmacy.

## 2024-02-24 MED ORDER — ISOSORBIDE MONONITRATE ER 30 MG PO TB24: 30.0000 mg | ORAL_TABLET | Freq: Every day | ORAL | 3 refills | Status: AC

## 2024-03-08 ENCOUNTER — Encounter: Payer: Self-pay | Admitting: Family Medicine

## 2024-03-08 ENCOUNTER — Ambulatory Visit (INDEPENDENT_AMBULATORY_CARE_PROVIDER_SITE_OTHER): Admitting: Family Medicine

## 2024-03-08 VITALS — BP 120/58 | HR 62 | Temp 98.3°F | Wt 134.0 lb

## 2024-03-08 DIAGNOSIS — E538 Deficiency of other specified B group vitamins: Secondary | ICD-10-CM | POA: Diagnosis not present

## 2024-03-08 DIAGNOSIS — M791 Myalgia, unspecified site: Secondary | ICD-10-CM | POA: Diagnosis not present

## 2024-03-08 DIAGNOSIS — K56609 Unspecified intestinal obstruction, unspecified as to partial versus complete obstruction: Secondary | ICD-10-CM | POA: Diagnosis not present

## 2024-03-08 DIAGNOSIS — R634 Abnormal weight loss: Secondary | ICD-10-CM | POA: Diagnosis not present

## 2024-03-08 NOTE — Progress Notes (Signed)
   Subjective:    Patient ID: Barbara [name], female    DOB: 08/07/1941, 83 y.o.   MRN: [id number]  HPI Here with a friend to discuss her chronic fatigue, her stiff joints, her muscle aches, and her weight loss. She has lost about 40 lbs over the past year, and though she claims she eats as much as she ever did. She no longer has any ankle swelling so she has taken any Lasix  for the past week. Her BP has been labile from normal to 180's systolic at times.    Review of Systems  Constitutional:  Positive for fatigue and unexpected weight change.  Respiratory: Negative.    Cardiovascular: Negative.   Gastrointestinal:  Positive for constipation. Negative for abdominal distention, abdominal pain, blood in stool, diarrhea, nausea and vomiting.  Genitourinary: Negative.   Musculoskeletal:  Positive for arthralgias and myalgias.  Neurological: Negative.        Objective:   Physical Exam Constitutional:      Comments: In a wheelchair   Cardiovascular:     Rate and Rhythm: Normal rate and regular rhythm.     Pulses: Normal pulses.     Heart sounds: Normal heart sounds.  Pulmonary:     Effort: Pulmonary effort is normal.     Breath sounds: Normal breath sounds.  Abdominal:     General: Abdomen is flat. Bowel sounds are normal. There is no distension.     Palpations: Abdomen is soft. There is no mass.     Tenderness: There is no abdominal tenderness. There is no guarding or rebound.     Hernia: No hernia is present.  Musculoskeletal:     Right lower leg: No edema.     Left lower leg: No edema.     Comments: Her hands are puffy and tender, she cannot make a fist with either hand due to pain.   Skin:    General: Skin is warm and dry.     Coloration: Skin is not pale.     Findings: No erythema or rash.  Neurological:     Mental Status: She is alert and oriented to person, place, and time.           Assessment & Plan:  She has had fatigue, joint and muscle pains, and  unintended weight loss. I suspect she is not eating very much food, so I asked the family to monitor this closely. I recommended she drink 2-3 Ensure or Boost shakes every day. We will check labs to rule out RA, etc. Set up CT scans of the chest, abdomen, and pelvis. We spent a total of (34   ) minutes reviewing records and discussing these issues.  Corita Diego, MD

## 2024-03-09 ENCOUNTER — Encounter (HOSPITAL_COMMUNITY): Payer: Self-pay | Admitting: Emergency Medicine

## 2024-03-09 ENCOUNTER — Inpatient Hospital Stay (HOSPITAL_COMMUNITY)
Admission: EM | Admit: 2024-03-09 | Discharge: 2024-03-11 | DRG: 378 | Disposition: A | Discharge status: Home / Self Care | Attending: Student | Admitting: Student

## 2024-03-09 ENCOUNTER — Emergency Department (HOSPITAL_COMMUNITY)

## 2024-03-09 ENCOUNTER — Telehealth: Payer: Self-pay

## 2024-03-09 ENCOUNTER — Other Ambulatory Visit: Payer: Self-pay

## 2024-03-09 DIAGNOSIS — I959 Hypotension, unspecified: Secondary | ICD-10-CM | POA: Diagnosis present

## 2024-03-09 DIAGNOSIS — E785 Hyperlipidemia, unspecified: Secondary | ICD-10-CM | POA: Diagnosis not present

## 2024-03-09 DIAGNOSIS — Z7901 Long term (current) use of anticoagulants: Secondary | ICD-10-CM

## 2024-03-09 DIAGNOSIS — K449 Diaphragmatic hernia without obstruction or gangrene: Secondary | ICD-10-CM | POA: Diagnosis present

## 2024-03-09 DIAGNOSIS — N1832 Chronic kidney disease, stage 3b: Secondary | ICD-10-CM | POA: Diagnosis present

## 2024-03-09 DIAGNOSIS — I272 Pulmonary hypertension, unspecified: Secondary | ICD-10-CM | POA: Diagnosis not present

## 2024-03-09 DIAGNOSIS — D500 Iron deficiency anemia secondary to blood loss (chronic): Secondary | ICD-10-CM | POA: Diagnosis not present

## 2024-03-09 DIAGNOSIS — I2510 Atherosclerotic heart disease of native coronary artery without angina pectoris: Secondary | ICD-10-CM | POA: Diagnosis present

## 2024-03-09 DIAGNOSIS — D649 Anemia, unspecified: Secondary | ICD-10-CM | POA: Diagnosis not present

## 2024-03-09 DIAGNOSIS — K219 Gastro-esophageal reflux disease without esophagitis: Secondary | ICD-10-CM | POA: Diagnosis present

## 2024-03-09 DIAGNOSIS — K5730 Diverticulosis of large intestine without perforation or abscess without bleeding: Secondary | ICD-10-CM | POA: Diagnosis not present

## 2024-03-09 DIAGNOSIS — I480 Paroxysmal atrial fibrillation: Secondary | ICD-10-CM | POA: Diagnosis not present

## 2024-03-09 DIAGNOSIS — Z87891 Personal history of nicotine dependence: Secondary | ICD-10-CM

## 2024-03-09 DIAGNOSIS — Z8249 Family history of ischemic heart disease and other diseases of the circulatory system: Secondary | ICD-10-CM

## 2024-03-09 DIAGNOSIS — Z6820 Body mass index (BMI) 20.0-20.9, adult: Secondary | ICD-10-CM

## 2024-03-09 DIAGNOSIS — I4891 Unspecified atrial fibrillation: Secondary | ICD-10-CM | POA: Diagnosis present

## 2024-03-09 DIAGNOSIS — N183 Chronic kidney disease, stage 3 unspecified: Secondary | ICD-10-CM | POA: Diagnosis present

## 2024-03-09 DIAGNOSIS — K5909 Other constipation: Secondary | ICD-10-CM | POA: Diagnosis present

## 2024-03-09 DIAGNOSIS — I10 Essential (primary) hypertension: Secondary | ICD-10-CM | POA: Diagnosis not present

## 2024-03-09 DIAGNOSIS — Z955 Presence of coronary angioplasty implant and graft: Secondary | ICD-10-CM

## 2024-03-09 DIAGNOSIS — D62 Acute posthemorrhagic anemia: Secondary | ICD-10-CM | POA: Diagnosis present

## 2024-03-09 DIAGNOSIS — D509 Iron deficiency anemia, unspecified: Secondary | ICD-10-CM | POA: Diagnosis present

## 2024-03-09 DIAGNOSIS — R634 Abnormal weight loss: Secondary | ICD-10-CM | POA: Diagnosis present

## 2024-03-09 DIAGNOSIS — Z888 Allergy status to other drugs, medicaments and biological substances status: Secondary | ICD-10-CM

## 2024-03-09 DIAGNOSIS — I5032 Chronic diastolic (congestive) heart failure: Secondary | ICD-10-CM | POA: Diagnosis present

## 2024-03-09 DIAGNOSIS — Z809 Family history of malignant neoplasm, unspecified: Secondary | ICD-10-CM

## 2024-03-09 DIAGNOSIS — G4730 Sleep apnea, unspecified: Secondary | ICD-10-CM | POA: Diagnosis present

## 2024-03-09 DIAGNOSIS — Z96611 Presence of right artificial shoulder joint: Secondary | ICD-10-CM | POA: Diagnosis present

## 2024-03-09 DIAGNOSIS — Z91048 Other nonmedicinal substance allergy status: Secondary | ICD-10-CM

## 2024-03-09 DIAGNOSIS — Z884 Allergy status to anesthetic agent status: Secondary | ICD-10-CM

## 2024-03-09 DIAGNOSIS — I495 Sick sinus syndrome: Secondary | ICD-10-CM | POA: Diagnosis present

## 2024-03-09 DIAGNOSIS — Z885 Allergy status to narcotic agent status: Secondary | ICD-10-CM

## 2024-03-09 DIAGNOSIS — I130 Hypertensive heart and chronic kidney disease with heart failure and stage 1 through stage 4 chronic kidney disease, or unspecified chronic kidney disease: Secondary | ICD-10-CM | POA: Diagnosis present

## 2024-03-09 DIAGNOSIS — Z96642 Presence of left artificial hip joint: Secondary | ICD-10-CM | POA: Diagnosis present

## 2024-03-09 DIAGNOSIS — F419 Anxiety disorder, unspecified: Secondary | ICD-10-CM | POA: Diagnosis not present

## 2024-03-09 DIAGNOSIS — N281 Cyst of kidney, acquired: Secondary | ICD-10-CM | POA: Diagnosis not present

## 2024-03-09 DIAGNOSIS — Z882 Allergy status to sulfonamides status: Secondary | ICD-10-CM

## 2024-03-09 DIAGNOSIS — K922 Gastrointestinal hemorrhage, unspecified: Secondary | ICD-10-CM | POA: Diagnosis not present

## 2024-03-09 DIAGNOSIS — E876 Hypokalemia: Secondary | ICD-10-CM | POA: Diagnosis not present

## 2024-03-09 DIAGNOSIS — Z950 Presence of cardiac pacemaker: Secondary | ICD-10-CM

## 2024-03-09 DIAGNOSIS — Z860100 Personal history of colon polyps, unspecified: Secondary | ICD-10-CM

## 2024-03-09 DIAGNOSIS — I252 Old myocardial infarction: Secondary | ICD-10-CM

## 2024-03-09 DIAGNOSIS — N1831 Chronic kidney disease, stage 3a: Secondary | ICD-10-CM | POA: Diagnosis not present

## 2024-03-09 DIAGNOSIS — Z7982 Long term (current) use of aspirin: Secondary | ICD-10-CM

## 2024-03-09 DIAGNOSIS — Z79899 Other long term (current) drug therapy: Secondary | ICD-10-CM

## 2024-03-09 LAB — CBC WITH DIFFERENTIAL/PLATELET
Abs Immature Granulocytes: 0.01 10*3/uL (ref 0.00–0.07)
Basophils Absolute: 0 10*3/uL (ref 0.0–0.1)
Basophils Absolute: 0 10*3/uL (ref 0.0–0.1)
Basophils Relative: 0.9 % (ref 0.0–3.0)
Basophils Relative: 0 %
Eosinophils Absolute: 0.1 10*3/uL (ref 0.0–0.7)
Eosinophils Absolute: 0.1 10*3/uL (ref 0.0–0.5)
Eosinophils Relative: 2.6 % (ref 0.0–5.0)
Eosinophils Relative: 3 %
HCT: 25.8 % — ABNORMAL LOW (ref 36.0–46.0)
HCT: 25.6 % — ABNORMAL LOW (ref 36.0–46.0)
Hemoglobin: 8 g/dL — CL (ref 12.0–15.0)
Hemoglobin: 7.6 g/dL — ABNORMAL LOW (ref 12.0–15.0)
Immature Granulocytes: 0 %
Lymphocytes Relative: 15.2 % (ref 12.0–46.0)
Lymphocytes Relative: 20 %
Lymphs Abs: 0.4 10*3/uL — ABNORMAL LOW (ref 0.7–4.0)
Lymphs Abs: 0.7 10*3/uL (ref 0.7–4.0)
MCH: 24.9 pg — ABNORMAL LOW (ref 26.0–34.0)
MCHC: 30.9 g/dL (ref 30.0–36.0)
MCHC: 29.7 g/dL — ABNORMAL LOW (ref 30.0–36.0)
MCV: 79 fl (ref 78.0–100.0)
MCV: 83.9 fL (ref 80.0–100.0)
Monocytes Absolute: 0.3 10*3/uL (ref 0.1–1.0)
Monocytes Absolute: 0.4 10*3/uL (ref 0.1–1.0)
Monocytes Relative: 9.2 % (ref 3.0–12.0)
Monocytes Relative: 11 %
Neutro Abs: 2.1 10*3/uL (ref 1.4–7.7)
Neutro Abs: 2.3 10*3/uL (ref 1.7–7.7)
Neutrophils Relative %: 72.1 % (ref 43.0–77.0)
Neutrophils Relative %: 66 %
Platelets: 183 10*3/uL (ref 150.0–400.0)
Platelets: 196 10*3/uL (ref 150–400)
RBC: 3.26 Mil/uL — ABNORMAL LOW (ref 3.87–5.11)
RBC: 3.05 MIL/uL — ABNORMAL LOW (ref 3.87–5.11)
RDW: 17.4 % — ABNORMAL HIGH (ref 11.5–15.5)
RDW: 16.9 % — ABNORMAL HIGH (ref 11.5–15.5)
WBC: 2.8 10*3/uL — ABNORMAL LOW (ref 4.0–10.5)
WBC: 3.5 10*3/uL — ABNORMAL LOW (ref 4.0–10.5)
nRBC: 0 % (ref 0.0–0.2)

## 2024-03-09 LAB — COMPREHENSIVE METABOLIC PANEL WITH GFR
ALT: 11 U/L (ref 0–44)
AST: 25 U/L (ref 15–41)
Albumin: 3.2 g/dL — ABNORMAL LOW (ref 3.5–5.0)
Alkaline Phosphatase: 62 U/L (ref 38–126)
Anion gap: 11 (ref 5–15)
BUN: 23 mg/dL (ref 8–23)
CO2: 23 mmol/L (ref 22–32)
Calcium: 10.7 mg/dL — ABNORMAL HIGH (ref 8.9–10.3)
Chloride: 105 mmol/L (ref 98–111)
Creatinine, Ser: 1.63 mg/dL — ABNORMAL HIGH (ref 0.44–1.00)
GFR, Estimated: 31 mL/min — ABNORMAL LOW (ref >60)
Glucose, Bld: 100 mg/dL — ABNORMAL HIGH (ref 70–99)
Potassium: 3.8 mmol/L (ref 3.5–5.1)
Sodium: 139 mmol/L (ref 135–145)
Total Bilirubin: 0.7 mg/dL (ref 0.0–1.2)
Total Protein: 7.3 g/dL (ref 6.5–8.1)

## 2024-03-09 LAB — FERRITIN: Ferritin: 44 ng/mL (ref 11–307)

## 2024-03-09 LAB — HEMOGLOBIN A1C: Hgb A1c MFr Bld: 5.5 % (ref 4.6–6.5)

## 2024-03-09 LAB — BASIC METABOLIC PANEL WITH GFR
BUN: 23 mg/dL (ref 6–23)
CO2: 26 meq/L (ref 19–32)
Calcium: 10.4 mg/dL (ref 8.4–10.5)
Chloride: 106 meq/L (ref 96–112)
Creatinine, Ser: 1.84 mg/dL — ABNORMAL HIGH (ref 0.40–1.20)
GFR: 24.81 mL/min — ABNORMAL LOW (ref >60.00)
Glucose, Bld: 101 mg/dL — ABNORMAL HIGH (ref 70–99)
Potassium: 3.6 meq/L (ref 3.5–5.1)
Sodium: 139 meq/L (ref 135–145)

## 2024-03-09 LAB — PROTIME-INR
INR: 1.3 — ABNORMAL HIGH (ref 0.8–1.2)
Prothrombin Time: 16.1 s — ABNORMAL HIGH (ref 11.4–15.2)

## 2024-03-09 LAB — IRON AND TIBC
Iron: 29 ug/dL (ref 28–170)
Saturation Ratios: 7 % — ABNORMAL LOW (ref 10.4–31.8)
TIBC: 423 ug/dL (ref 250–450)
UIBC: 394 ug/dL

## 2024-03-09 LAB — RETICULOCYTES
Immature Retic Fract: 16.8 % — ABNORMAL HIGH (ref 2.3–15.9)
RBC.: 1.37 MIL/uL — ABNORMAL LOW (ref 3.87–5.11)
Retic Count, Absolute: 32.5 10*3/uL (ref 19.0–186.0)
Retic Ct Pct: 2.4 % (ref 0.4–3.1)

## 2024-03-09 LAB — FOLATE: Folate: 19.5 ng/mL (ref >5.9)

## 2024-03-09 LAB — TSH
TSH: 2.18 u[IU]/mL (ref 0.35–5.50)
TSH: 2.515 u[IU]/mL (ref 0.350–4.500)

## 2024-03-09 LAB — HEPATIC FUNCTION PANEL
ALT: 10 U/L (ref 0–35)
AST: 23 U/L (ref 0–37)
Albumin: 3.6 g/dL (ref 3.5–5.2)
Alkaline Phosphatase: 67 U/L (ref 39–117)
Bilirubin, Direct: 0.2 mg/dL (ref 0.0–0.3)
Total Bilirubin: 0.7 mg/dL (ref 0.2–1.2)
Total Protein: 7.4 g/dL (ref 6.0–8.3)

## 2024-03-09 LAB — SEDIMENTATION RATE: Sed Rate: 74 mm/h — ABNORMAL HIGH (ref 0–30)

## 2024-03-09 LAB — VITAMIN B12
Vitamin B-12: 210 pg/mL — ABNORMAL LOW (ref 211–911)
Vitamin B-12: 302 pg/mL (ref 180–914)

## 2024-03-09 LAB — CK: Total CK: 55 U/L (ref 7–177)

## 2024-03-09 LAB — T4, FREE: Free T4: 1.36 ng/dL — ABNORMAL HIGH (ref 0.61–1.12)

## 2024-03-09 LAB — PREPARE RBC (CROSSMATCH)

## 2024-03-09 LAB — POC OCCULT BLOOD, ED: Fecal Occult Bld: POSITIVE — AB

## 2024-03-09 MED ORDER — PANTOPRAZOLE SODIUM 40 MG IV SOLR
40.0000 mg | Freq: Two times a day (BID) | INTRAVENOUS | Status: DC
  Administered 2024-03-09 – 2024-03-11 (×4): 40 mg via INTRAVENOUS
  Filled 2024-03-09 (×4): qty 10

## 2024-03-09 MED ORDER — ONDANSETRON HCL 4 MG/2ML IJ SOLN: 4.0000 mg | Freq: Four times a day (QID) | INTRAMUSCULAR | Status: DC | PRN

## 2024-03-09 MED ORDER — ONDANSETRON HCL 4 MG PO TABS: 4.0000 mg | ORAL_TABLET | Freq: Four times a day (QID) | ORAL | Status: DC | PRN

## 2024-03-09 MED ORDER — HYDRALAZINE HCL 20 MG/ML IJ SOLN
5.0000 mg | INTRAMUSCULAR | Status: DC | PRN
  Administered 2024-03-10: 5 mg via INTRAVENOUS
  Filled 2024-03-09: qty 1

## 2024-03-09 MED ORDER — SODIUM CHLORIDE 0.9% FLUSH: 3.0000 mL | INTRAVENOUS | Status: DC | PRN

## 2024-03-09 MED ORDER — METOPROLOL SUCCINATE ER 25 MG PO TB24
25.0000 mg | ORAL_TABLET | Freq: Every day | ORAL | Status: DC
  Administered 2024-03-09 – 2024-03-10 (×2): 25 mg via ORAL
  Filled 2024-03-09 (×2): qty 1

## 2024-03-09 MED ORDER — ROSUVASTATIN CALCIUM 20 MG PO TABS
40.0000 mg | ORAL_TABLET | Freq: Every day | ORAL | Status: DC
Start: 2024-03-09 — End: 2024-03-11
  Administered 2024-03-09 – 2024-03-10 (×2): 40 mg via ORAL
  Filled 2024-03-09 (×2): qty 2

## 2024-03-09 MED ORDER — SODIUM CHLORIDE 0.9% IV SOLUTION: Freq: Once | INTRAVENOUS | Status: AC

## 2024-03-09 MED ORDER — SODIUM CHLORIDE 0.9% FLUSH
3.0000 mL | Freq: Two times a day (BID) | INTRAVENOUS | Status: DC
  Administered 2024-03-09 – 2024-03-11 (×4): 10 mL via INTRAVENOUS

## 2024-03-09 NOTE — Assessment & Plan Note (Addendum)
 Suspect patient's anemia with a hemoglobin of 7.6 today to be due to chronic GI loss. Currently with guaiac positive stools and malaise and fatigue will transfuse 1 unit PRBC. 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

## 2024-03-09 NOTE — ED Provider Triage Note (Signed)
 Emergency Medicine Provider Triage Evaluation Note  Barbara [name] , a 83 y.o. female  was evaluated in triage.  Pt complains of abnormal lab. States she got a call from her pcp today telling her to come to the ER due to low hemoglobin.  Chart reviewed, patient's hemoglobin is 8.  Denies any history of anemia previously.  She has never required a blood transfusion before.  States she might be having dark stool but is not sure. Also notes bright red blood on the toilet paper when she wipes sometimes. No abdominal pain. Is on eliquis   Review of Systems  Positive:  Negative:   Physical Exam  BP (!) 118/54 (BP Location: Left Arm)   Pulse 63   Temp 98.5 F (36.9 C)   Resp 17   Ht 5\' 8"  (1.727 m)   Wt 60.8 kg   SpO2 96%   BMI 20.37 kg/m  Gen:   Awake, no distress   Resp:  Normal effort  MSK:   Moves extremities without difficulty  Other:    Medical Decision Making  Medically screening exam initiated at 4:29 PM.  Appropriate orders placed.  Barbara [name] was informed that the remainder of the evaluation will be completed by another provider, this initial triage assessment does not replace that evaluation, and the importance of remaining in the ED until their evaluation is complete.     Barbara Dk, PA-C 03/09/24 [phone]

## 2024-03-09 NOTE — Assessment & Plan Note (Addendum)
 Vitals:   03/09/24 1547 03/09/24 1715 03/09/24 1830 03/09/24 1845  BP: (!) 118/54 (!) 143/56 (!) 147/59 124/89  With patient's blood pressure fluctuating the weight is we will currently hold Imdur  continue patient on her metoprolol  for her A-fib.  Will also hold patient's hydralazine .

## 2024-03-09 NOTE — Telephone Encounter (Signed)
Message sent to Dr Fry for advise 

## 2024-03-09 NOTE — Assessment & Plan Note (Addendum)
 Stable no complaints of chest pain pressure or any anginal symptoms.  Will continue patient on her Crestor  and metoprolol .  Will hold her Imdur  as blood pressure is fluctuating and is at risk for for dropping.  N.p.o. after midnight.

## 2024-03-09 NOTE — Assessment & Plan Note (Addendum)
 Unintentional weight loss reported to ED , patient does not report any weight concerns to me at bedside.  Will obtain a free T4 and a TSH.  CT of the abdomen and pelvis pending. Filed Weights   03/09/24 1554  Weight: 60.8 kg

## 2024-03-09 NOTE — Assessment & Plan Note (Addendum)
 GI consult requested - EDMD. Patient is amenable for procedure if needed.

## 2024-03-09 NOTE — Assessment & Plan Note (Addendum)
 EKG showing atrial paced rhythm.  Antiplatelet therapy with aspirin  and Eliquis  currently held.

## 2024-03-09 NOTE — H&P (Signed)
 History and Physical    Patient: Barbara [name] JWJ:[id number] DOB: Apr 23, 1941 DOA: 03/09/2024 DOS: the patient was seen and examined on 03/09/2024 PCP: Donley Furth, MD  Patient coming from:  PCP office Chief complaint: Chief Complaint  Patient presents with   Abnormal Lab    Hemoglobin    HPI:  Barbara [name] is a 83 y.o. female with past medical history  of  CAD, diastolic CHF, A-fib on Eliquis , essential hypertension, hyperlipidemia, anxiety disorder, CKD stage IIIa sent from PCPs office for low blood count.  Patient lives alone has a very good support system with her church members her brother-in-law and her sister live in town most of her children are living out of state.  Patient describes feeling a little tired but other than that has been doing well states that she was doing well preparing for Mother's Day.  Has a grandson lives here in town .  Other than feeling tired patient does not report any complaints chest pain shortness of breath lastly she states she did have a little bit of blurred vision a day or 2 ago but other than is at baseline.  Per EDMD patient has been losing weight unintentionally and is guaiac positive today.  ED Course: Pt in ed at bedside  is alert awake oriented gives history.  Afebrile.  CODE STATUS discussed with patient she states that she would like to be full code. Vital signs in the ED were notable for the following:  Vitals:   03/09/24 1715 03/09/24 1715 03/09/24 1830 03/09/24 1845  BP: (!) 143/56  (!) 147/59 124/89  Pulse: 60  60 60  Temp:      Resp: 13  16 18   Height:      Weight:      SpO2: 100% 99% 100% 100%  BMI (Calculated):      >>ED evaluation thus far shows: -EKG on 8 April shows a atrial paced rhythm at 64 PR of 292 QTc of 435 T wave inversions diffusely in inferior and lateral leads.  On comparison this is improved similar but improved from her EKG in November 2024 when she said she did have a fall. -CMP today shows CKD  stage IIIb with a creatinine of 1.63 EGFR of 31, calcium  10.7 normal LFTs. -CBC shows White count of 3.5 hemoglobin of 7.6 platelets of 196. -Fecal occult positive stools. -CT of the abdomen and pelvis noncontrast is ordered and pending.  >>While in the ED patient received the following: Medications - No data to display  Review of Systems  Constitutional:  Positive for malaise/fatigue.  Gastrointestinal:  Negative for abdominal pain, blood in stool, constipation, diarrhea, melena, nausea and vomiting.  All other systems reviewed and are negative.  Past Medical History:  Diagnosis Date   A-fib [facility])    Allergy    CAD (coronary artery disease)    sees Dr. Magnus Schuller  cardiac stents - 2000   CHF (congestive heart failure) (HCC)    Colon polyps    Complication of anesthesia    rash/hives with "caines"   Dyspnea    02/12/18 " when my heart gets out of rhythm, it has not been out of rhythym- since I have been on Tikosyn  (11/2017)   Dysrhythmia    afib fib   GERD (gastroesophageal reflux disease)    takes OTC- Omeprazole - prn   Heart murmur    History of kidney stones    History of stress test    show normal perfusion  without scar or ischemia, post EF 68%   Hx of echocardiogram    show an EF 55%-60% range with grade 1 diastolic dysfunction, she had mitral anular calcification with mild MR, moderate LA dilation and mild pulmonary hypertension with a PA estimated pressure of 39mm   Hyperlipidemia    Hypertension    NSTEMI (non-ST elevated myocardial infarction) (HCC)    Osteoarthritis    Pacemaker    Pneumonia    hx of 2015    PONV (postoperative nausea and vomiting)    Sleep apnea    Past Surgical History:  Procedure Laterality Date   CARDIAC CATHETERIZATION     11/2017   cardiac stents  2000   COLONOSCOPY  01-05-14   per Dr. Delilah Fend, clear, no repeats needed    COLONOSCOPY     CORONARY STENT PLACEMENT  2000   in LAD   CYSTOSCOPY WITH RETROGRADE PYELOGRAM, URETEROSCOPY  AND STENT PLACEMENT Left 06/11/2022   Procedure: CYSTOSCOPY WITH RETROGRADE PYELOGRAM, LEFT STENT PLACEMENT;  Surgeon: Samson Croak, MD;  Location: [facility] ORS;  Service: Urology;  Laterality: Left;   CYSTOSCOPY/URETEROSCOPY/HOLMIUM LASER/STENT PLACEMENT Left 06/25/2022   Procedure: CYSTOSCOPY LEFT URETEROSCOPY/HOLMIUM LASER/STENT PLACEMENT;  Surgeon: Homero Luster, MD;  Location: [facility];  Service: Urology;  Laterality: Left;  1 HR FOR THIS CASE   DIRECT LARYNGOSCOPY WITH RADIAESSE INJECTION N/A 02/13/2018   Procedure: DIRECT LARYNGOSCOPY WITH RADIAESSE INJECTION;  Surgeon: Virgina Grills, MD;  Location[facility] OR;  Service: ENT;  Laterality: N/A;   EYE SURGERY Left    CATARACT REMOVAL   HIP ARTHROPLASTY Left 09/27/2023   Procedure: ARTHROPLASTY BIPOLAR HIP (HEMIARTHROPLASTY);  Surgeon: Saundra Curl, MD;  Location: [facility] OR;  Service: Orthopedics;  Laterality: Left;   KNEE ARTHROSCOPY Left 01/06/2015   Procedure: LEFT KNEE ARTHROSCOPY, abrasion chondroplasty of the medial femerol condryl,medial and lateral menisectomy, microfracture , synovectomy of the suprpatellar pouch;  Surgeon: Hazle Lites, MD;  Location: [facility];  Service: Orthopedics;  Laterality: Left;   LEFT HEART CATH AND CORONARY ANGIOGRAPHY N/A 02/26/2019   Procedure: LEFT HEART CATH AND CORONARY ANGIOGRAPHY;  Surgeon: Millicent Ally, MD;  Location: [facility];  Service: Cardiovascular;  Laterality: N/A;   LEFT HEART CATH AND CORONARY ANGIOGRAPHY N/A 05/06/2022   Procedure: LEFT HEART CATH AND CORONARY ANGIOGRAPHY;  Surgeon: Sammy Crisp, MD;  Location: [facility];  Service: Cardiovascular;  Laterality: N/A;   MICROLARYNGOSCOPY W/VOCAL CORD INJECTION N/A 08/07/2018   Procedure: MICROLARYNGOSCOPY WITH VOCAL CORD INJECTION OF PROLARYN;  Surgeon: Virgina Grills, MD;  Location: [facility] OR;  Service: ENT;  Laterality: N/A;  JET VENTILATION   PACEMAKER IMPLANT N/A 05/08/2022   Procedure: PACEMAKER IMPLANT;  Surgeon: Lei Pump,  MD;  Location: [facility];  Service: Cardiovascular;  Laterality: N/A;   REVERSE SHOULDER ARTHROPLASTY Right 03/23/2020   Procedure: REVERSE SHOULDER ARTHROPLASTY;  Surgeon: Janeth Medicus, MD;  Location: [facility] OR;  Service: Orthopedics;  Laterality: Right;   VAGINAL HYSTERECTOMY  1971    reports that she quit smoking about 27 years ago. Her smoking use included cigarettes. She started smoking about 67 years ago. She has never used smokeless tobacco. She reports that she does not drink alcohol and does not use drugs. Allergies  Allergen Reactions   Lidocaine  Anaphylaxis   Morphine  Other (See Comments)    "Body shuts down"   Procaine Hcl Anaphylaxis, Rash and Other (See Comments)    "Anything with 'caine' in it "  Sulfonamide Derivatives Hives   Amlodipine  Swelling   Norco [Hydrocodone -Acetaminophen ] Nausea And Vomiting and Other (See Comments)    States does ok with IV form    Tramadol  Nausea And Vomiting   Vytorin [Ezetimibe-Simvastatin] Other (See Comments)    Joint pain   Tape Itching and Other (See Comments)    Patient prefers either paper tape or Coban wrap   Family History  Problem Relation Age of Onset   Cancer Maternal Grandmother    Heart disease Maternal Grandfather    Prior to Admission medications   Medication Sig Start Date End Date Taking? Authorizing Provider  acetaminophen  (TYLENOL ) 500 MG tablet Take 500 mg by mouth daily as needed for headache.    [provider]  alum & mag hydroxide-simeth (MAALOX/MYLANTA) 200-200-20 MG/5ML suspension Take 30 mLs by mouth every 4 (four) hours as needed for indigestion. 09/30/23   Magdalene School, MD  apixaban  (ELIQUIS ) 2.5 MG TABS tablet Take 1 tablet (2.5 mg total) by mouth 2 (two) times daily. 01/21/24   Donley Furth, MD  aspirin  81 MG chewable tablet Chew 1 tablet (81 mg total) by mouth daily. Patient taking differently: Chew 81 mg by mouth in the morning. 05/11/22   Singh, Prashant K, MD  Cholecalciferol   (VITAMIN D3) 2000 units TABS Take 2,000 Units by mouth in the morning.    [provider]  dextromethorphan  (DELSYM ) 30 MG/5ML liquid Take 15 mg by mouth daily as needed for cough.    [provider]  diphenhydrAMINE  HCl, Sleep, (ZZZQUIL PO) Take 1 each by mouth at bedtime. Vicks Pure Zzz's chew tablet for Sleep    [provider]  empagliflozin  (JARDIANCE ) 10 MG TABS tablet Take 1 tablet (10 mg total) by mouth daily. 01/21/24   Donley Furth, MD  hydrALAZINE  (APRESOLINE ) 100 MG tablet Take 1.5 tablets (150 mg total) by mouth 2 (two) times daily. 12/26/23   Donley Furth, MD  ipratropium (ATROVENT ) 0.06 % nasal spray Place 2 sprays into both nostrils 2 (two) times daily as needed (allergies). 04/28/18   [provider]  isosorbide  mononitrate (IMDUR ) 30 MG 24 hr tablet Take 1 tablet (30 mg total) by mouth daily. 02/24/24   Donley Furth, MD  ketoconazole  (NIZORAL ) 2 % cream Apply 1 Application topically 2 (two) times daily as needed for irritation. 12/26/23   Donley Furth, MD  loratadine  (CLARITIN ) 10 MG tablet Take 10 mg by mouth daily as needed for allergies.    [provider]  LORazepam  (ATIVAN ) 0.5 MG tablet TAKE 1 TABLET BY MOUTH EVERY 6 HOURS AS NEEDED FOR ANXIETY 12/23/23   Donley Furth, MD  metoprolol  succinate (TOPROL -XL) 100 MG 24 hr tablet Take 1 tablet (100 mg total) by mouth at bedtime. Take with or immediately following a meal. 01/09/24   Donley Furth, MD  nitroGLYCERIN  (NITROSTAT ) 0.4 MG SL tablet DISSOLVE ONE TABLET UNDER THE TONGUE EVERY 5 MINUTES AS NEEDED FOR CHEST PAIN.  DO NOT EXCEED A TOTAL OF 3 DOSES IN 15 MINUTES Patient taking differently: Place 0.4 mg under the tongue every 5 (five) minutes as needed for chest pain. 03/27/23   Millicent Ally, MD  pantoprazole  (PROTONIX ) 40 MG tablet Take 1 tablet (40 mg total) by mouth daily. 02/13/24 02/13/25  Donley Furth, MD  Probiotic Product (PROBIOTIC GUMMIES PO) Take 5 each by mouth every  morning. Align Probiotic    [provider]  rosuvastatin  (CRESTOR ) 40 MG tablet Take 1 tablet by  mouth once daily Patient taking differently: Take 40 mg by mouth at bedtime. 08/22/23   Millicent Ally, MD  triamcinolone  (NASACORT) 55 MCG/ACT nasal inhaler Place 1 spray into both nostrils daily as needed (for allergies).  10/17/12   Donley Furth, MD                                                                                 Vitals:   03/09/24 1715 03/09/24 1715 03/09/24 1830 03/09/24 1845  BP: (!) 143/56  (!) 147/59 124/89  Pulse: 60  60 60  Resp: 13  16 18   Temp:      SpO2: 100% 99% 100% 100%  Weight:      Height:       Physical Exam Vitals and nursing note reviewed.  Constitutional:      General: She is not in acute distress.    Appearance: Normal appearance. She is not ill-appearing.  HENT:     Head: Normocephalic and atraumatic.     Right Ear: Hearing and external ear normal.     Left Ear: Hearing and external ear normal.     Nose: No nasal deformity.     Mouth/Throat:     Lips: Pink.  Eyes:     General: Lids are normal.     Extraocular Movements: Extraocular movements intact.  Cardiovascular:     Rate and Rhythm: Normal rate and regular rhythm.     Heart sounds: Normal heart sounds.  Pulmonary:     Effort: Pulmonary effort is normal.     Breath sounds: Normal breath sounds.  Abdominal:     General: Bowel sounds are normal. There is no distension.     Palpations: Abdomen is soft. There is no mass.     Tenderness: There is no abdominal tenderness.  Musculoskeletal:     Right lower leg: No edema.     Left lower leg: No edema.  Skin:    General: Skin is warm.  Neurological:     General: No focal deficit present.     Mental Status: She is alert and oriented to person, place, and time.     Cranial Nerves: Cranial nerves 2-12 are intact.  Psychiatric:        Mood and Affect: Mood normal.        Speech: Speech normal.        Behavior: Behavior normal.      Labs on Admission: I have personally reviewed following labs and imaging studies CBC: Recent Labs  Lab 03/08/24 1519 03/09/24 1630  WBC 2.8* 3.5*  NEUTROABS 2.1 2.3  HGB 8.0 Repeated and verified X2.* 7.6*  HCT 25.8* 25.6*  MCV 79.0 83.9  PLT 183.0 196   Basic Metabolic Panel: Recent Labs  Lab 03/08/24 1519 03/09/24 1630  NA 139 139  K 3.6 3.8  CL 106 105  CO2 26 23  GLUCOSE 101* 100*  BUN 23 23  CREATININE 1.84* 1.63*  CALCIUM  10.4 10.7*   GFR: Estimated Creatinine Clearance: 24.7 mL/min (A) (by C-G formula based on SCr of 1.63 mg/dL (H)). Liver Function Tests: Recent Labs  Lab 03/08/24 1519 03/09/24 1630  AST 23 25  ALT  10 11  ALKPHOS 67 62  BILITOT 0.7 0.7  PROT 7.4 7.3  ALBUMIN 3.6 3.2*   No results for input(s): "LIPASE", "AMYLASE" in the last 168 hours. No results for input(s): "AMMONIA" in the last 168 hours. Coagulation Profile: Recent Labs  Lab 03/09/24 1630  INR 1.3*   Cardiac Enzymes: Recent Labs  Lab 03/08/24 1519  CKTOTAL 55   BNP (last 3 results) No results for input(s): "PROBNP" in the last 8760 hours. HbA1C: Recent Labs    03/08/24 1519  HGBA1C 5.5   CBG: No results for input(s): "GLUCAP" in the last 168 hours. Lipid Profile: No results for input(s): "CHOL", "HDL", "LDLCALC", "TRIG", "CHOLHDL", "LDLDIRECT" in the last 72 hours. Thyroid  Function Tests: Recent Labs    03/08/24 1519  TSH 2.18   Anemia Panel: Recent Labs    03/08/24 1519 03/09/24 1829  VITAMINB12 210* 302  FOLATE  --  19.5  FERRITIN  --  44  TIBC  --  423  IRON  --  29  RETICCTPCT  --  2.4   Urine analysis:    Component Value Date/Time   COLORURINE YELLOW 06/09/2022 1426   APPEARANCEUR CLOUDY (A) 06/09/2022 1426   LABSPEC 1.010 06/09/2022 1426   PHURINE 6.5 06/09/2022 1426   GLUCOSEU NEGATIVE 06/09/2022 1426   GLUCOSEU NEGATIVE 09/13/2021 1203   HGBUR LARGE (A) 06/09/2022 1426   HGBUR negative 10/17/2010 0859   BILIRUBINUR NEGATIVE  06/09/2022 1426   BILIRUBINUR neg 08/01/2021 1037   KETONESUR NEGATIVE 06/09/2022 1426   PROTEINUR NEGATIVE 06/09/2022 1426   UROBILINOGEN 0.2 09/13/2021 1203   NITRITE NEGATIVE 06/09/2022 1426   LEUKOCYTESUR TRACE (A) 06/09/2022 1426   Radiological Exams on Admission: No results found. Data Reviewed: Relevant notes from primary care and specialist visits, past discharge summaries as available in EHR, including Care Everywhere. Prior diagnostic testing as pertinent to current admission diagnoses, Updated medications and problem lists for reconciliation ED course, including vitals, labs, imaging, treatment and response to treatment,Triage notes, nursing and pharmacy notes and ED provider's notes Notable results as noted in HPI.Discussed case with EDMD/ ED APP/ or Specialty MD on call and as needed.  Assessment & Plan Iron deficiency anemia Suspect patient's anemia with a hemoglobin of 7.6 today to be due to chronic GI loss. Currently with guaiac positive stools and malaise and fatigue will transfuse 1 unit PRBC. 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   GI bleed GI consult requested - EDMD. Patient is amenable for procedure if needed.  Abnormal weight loss Unintentional weight loss reported to ED , patient does not report any weight concerns to me at bedside.  Will obtain a free T4 and a TSH.  CT of the abdomen and pelvis pending. Filed Weights   03/09/24 1554  Weight: 60.8 kg    CAD (coronary artery disease/elevated troponin Stable no complaints of chest pain pressure or any anginal symptoms.  Will continue patient on her Crestor  and metoprolol .  Will hold her Imdur  as blood pressure is fluctuating and is at risk for for dropping.  N.p.o. after midnight. PAF (paroxysmal atrial fibrillation) (HCC) EKG showing atrial paced rhythm.  Antiplatelet therapy with aspirin  and Eliquis  currently held. Essential hypertension Vitals:   03/09/24 1547 03/09/24 1715  03/09/24 1830 03/09/24 1845  BP: (!) 118/54 (!) 143/56 (!) 147/59 124/89  With patient's blood pressure fluctuating the weight is we will currently hold Imdur  continue patient on her metoprolol  for her A-fib.  Will also hold  patient's hydralazine .  CKD (chronic kidney disease), stage III (HCC) Lab Results  Component Value Date   CREATININE 1.63 (H) 03/09/2024   CREATININE 1.84 (H) 03/08/2024   CREATININE 1.58 (H) 09/27/2023  Kidney function is stable, will avoid contrast NSAIDs renally dose meds.  ABLA (acute blood loss anemia) As above.   DVT prophylaxis:  Scd's  Consults:  GI  Advance Care Planning:    Code Status: Full Code   Family Communication:  None  Disposition Plan:  Home  Severity of Illness: The appropriate patient status for this patient is INPATIENT. Inpatient status is judged to be reasonable and necessary in order to provide the required intensity of service to ensure the patient's safety. The patient's presenting symptoms, physical exam findings, and initial radiographic and laboratory data in the context of their chronic comorbidities is felt to place them at high risk for further clinical deterioration. Furthermore, it is not anticipated that the patient will be medically stable for discharge from the hospital within 2 midnights of admission.   * I certify that at the point of admission it is my clinical judgment that the patient will require inpatient hospital care spanning beyond 2 midnights from the point of admission due to high intensity of service, high risk for further deterioration and high frequency of surveillance required.*  Unresulted Labs (From admission, onward)     Start     Ordered   03/10/24 0500  Comprehensive metabolic panel  Tomorrow morning,   R        03/09/24 1941   03/10/24 0500  CBC  Tomorrow morning,   R        03/09/24 1941   03/09/24 1942  T4, free  Add-on,   AD        03/09/24 1941   03/09/24 1942  Type and screen [facility]  (Blood Administration Adult)  Once,   R       Comments: [facility]    03/09/24 1943   03/09/24 1942  Prepare RBC (crossmatch)  (Blood Administration Adult)  Once,   R       Question Answer Comment  # of Units 1 unit   Transfusion Indications Other   Transfusion Indications Hemoglobin < 7 gm/dL and symptomatic   Comments GIB   Special Requirements Other (specify)   Number of Units to Keep Ahead NO units ahead   If emergent release call blood bank Not emergent release      03/09/24 1943   03/09/24 1941  TSH  Once,   R        03/09/24 1941            Orders Placed This Encounter  Procedures   CT ABDOMEN PELVIS WO CONTRAST   Comprehensive metabolic panel   CBC with Differential   Protime-INR   Vitamin B12   Folate   Iron and TIBC   Ferritin   Reticulocytes   TSH   T4, free   Comprehensive metabolic panel   CBC   Diet clear liquid Room service appropriate? Yes; Fluid consistency: Thin   Initiate Carrier Fluid Protocol   SCDs   Cardiac Monitoring Continuous x 24 hours Indications for use: Other; other indications for use: Monitor for ischemia   Vital signs   Notify physician (specify)   Mobility Protocol: No Restrictions RN to initiate protocols based on patient's level of care   Refer to Sidebar Report Refer to ICU, Med-Surg, Progressive, and Step-Down Mobility  Protocol Sidebars   Initiate Adult Central Line Maintenance and Catheter Protocol for patients with central line (CVC, PICC, Port, Hemodialysis, Trialysis)   If patient diabetic or glucose greater than 140 notify physician for Sliding Scale Insulin Orders   Intake and Output   Do not place and if present remove PureWick   Initiate Oral Care Protocol   Initiate Carrier Fluid Protocol   RN may order General Admission PRN Orders utilizing "General Admission PRN medications" (through manage orders) for the following patient needs: allergy symptoms (Claritin ), cold sores (Carmex),  cough (Robitussin DM), eye irritation (Liquifilm Tears), hemorrhoids (Tucks), indigestion (Maalox), minor skin irritation (Hydrocortisone Cream), muscle pain Lovena Rubinstein Gay), nose irritation (saline nasal spray) and sore throat (Chloraseptic spray).   Informed Consent Details: Physician/Practitioner Attestation; Transcribe to consent form and obtain patient signature   CBC post transfusion - RN to place lab order with appropriate draw time   Full code   Consult to hospitalist   Pulse oximetry check with vital signs   Oxygen therapy Mode or (Route): Nasal cannula; Liters Per Minute: 2; Keep O2 saturation between: greater than 92 %   POC occult blood, ED   Type and screen [facility]   Type and screen [facility]   Prepare RBC (crossmatch)   Admit to Inpatient (patient's expected length of stay will be greater than 2 midnights or inpatient only procedure)   Aspiration precautions   Fall precautions    Author: Lavanda Porter, MD 12 pm -8 pm. 03/09/2024 7:45 PM >>Please note for any concern,or critical results after hours past 8pm please contact the [facility] hospitalist [facility] floor coverage provider from 7 PM- 7 AM. For on call review [email or web address], username [email or web address] and PW: your phone number<<

## 2024-03-09 NOTE — Assessment & Plan Note (Signed)
-  As above.

## 2024-03-09 NOTE — ED Notes (Signed)
 CCMD called and patient placed on monitor

## 2024-03-09 NOTE — ED Triage Notes (Signed)
 Patient arrives in wheelchair by POV states she was sent by her doctor for hemoglobin 8. Patient reports feeling weaker and having fatigue intermittently. On eliquis . Denies any blood in stool or dark stools.

## 2024-03-09 NOTE — ED Provider Notes (Signed)
 [facility] Provider Note   CSN: [id number] Arrival date & time: 03/09/24  1541     History  Chief Complaint  Patient presents with   Abnormal Lab    Hemoglobin     Barbara [name] is a 83 y.o. female.  HPI 83 year old female presents with anemia.  Was called by her PCP due to a low hemoglobin.  Hemoglobin was 8 yesterday and she was called by the office to come to the hospital.  She states she did have some bright red blood when she wiped after a bowel movement today but thinks that is because she was straining.  She has not noticed any bloody stools or melena otherwise.  She denies any new or worsening fatigue or shortness of breath though does chronically have dyspnea on exertion.  No chest or abdominal pain.  Home Medications Prior to Admission medications   Medication Sig Start Date End Date Taking? Authorizing Provider  acetaminophen  (TYLENOL ) 500 MG tablet Take 500 mg by mouth daily as needed for headache.    [provider]  alum & mag hydroxide-simeth (MAALOX/MYLANTA) 200-200-20 MG/5ML suspension Take 30 mLs by mouth every 4 (four) hours as needed for indigestion. 09/30/23   Magdalene School, MD  apixaban  (ELIQUIS ) 2.5 MG TABS tablet Take 1 tablet (2.5 mg total) by mouth 2 (two) times daily. 01/21/24   Donley Furth, MD  aspirin  81 MG chewable tablet Chew 1 tablet (81 mg total) by mouth daily. Patient taking differently: Chew 81 mg by mouth in the morning. 05/11/22   Singh, Prashant K, MD  Cholecalciferol  (VITAMIN D3) 2000 units TABS Take 2,000 Units by mouth in the morning.    [provider]  dextromethorphan  (DELSYM ) 30 MG/5ML liquid Take 15 mg by mouth daily as needed for cough.    [provider]  diphenhydrAMINE  HCl, Sleep, (ZZZQUIL PO) Take 1 each by mouth at bedtime. Vicks Pure Zzz's chew tablet for Sleep    [provider]  empagliflozin  (JARDIANCE ) 10 MG TABS tablet Take 1 tablet (10  mg total) by mouth daily. 01/21/24   Donley Furth, MD  hydrALAZINE  (APRESOLINE ) 100 MG tablet Take 1.5 tablets (150 mg total) by mouth 2 (two) times daily. 12/26/23   Donley Furth, MD  ipratropium (ATROVENT ) 0.06 % nasal spray Place 2 sprays into both nostrils 2 (two) times daily as needed (allergies). 04/28/18   [provider]  isosorbide  mononitrate (IMDUR ) 30 MG 24 hr tablet Take 1 tablet (30 mg total) by mouth daily. 02/24/24   Donley Furth, MD  ketoconazole  (NIZORAL ) 2 % cream Apply 1 Application topically 2 (two) times daily as needed for irritation. 12/26/23   Donley Furth, MD  loratadine  (CLARITIN ) 10 MG tablet Take 10 mg by mouth daily as needed for allergies.    [provider]  LORazepam  (ATIVAN ) 0.5 MG tablet TAKE 1 TABLET BY MOUTH EVERY 6 HOURS AS NEEDED FOR ANXIETY 12/23/23   Donley Furth, MD  metoprolol  succinate (TOPROL -XL) 100 MG 24 hr tablet Take 1 tablet (100 mg total) by mouth at bedtime. Take with or immediately following a meal. 01/09/24   Donley Furth, MD  nitroGLYCERIN  (NITROSTAT ) 0.4 MG SL tablet DISSOLVE ONE TABLET UNDER THE TONGUE EVERY 5 MINUTES AS NEEDED FOR CHEST PAIN.  DO NOT EXCEED A TOTAL OF 3 DOSES IN 15 MINUTES Patient taking differently: Place 0.4 mg under the tongue every 5 (five) minutes as needed for  chest pain. 03/27/23   Millicent Ally, MD  pantoprazole  (PROTONIX ) 40 MG tablet Take 1 tablet (40 mg total) by mouth daily. 02/13/24 02/13/25  Donley Furth, MD  Probiotic Product (PROBIOTIC GUMMIES PO) Take 5 each by mouth every morning. Align Probiotic    [provider]  rosuvastatin  (CRESTOR ) 40 MG tablet Take 1 tablet by mouth once daily Patient taking differently: Take 40 mg by mouth at bedtime. 08/22/23   Millicent Ally, MD  triamcinolone  (NASACORT) 55 MCG/ACT nasal inhaler Place 1 spray into both nostrils daily as needed (for allergies).  10/17/12   Donley Furth, MD      Allergies    Lidocaine , Morphine , Procaine hcl,  Sulfonamide derivatives, Amlodipine , Norco [hydrocodone -acetaminophen ], Tramadol , Vytorin [ezetimibe-simvastatin], and Tape    Review of Systems   Review of Systems  Constitutional:  Positive for unexpected weight change. Negative for fatigue.  Respiratory:  Negative for shortness of breath.   Cardiovascular:  Negative for chest pain.  Gastrointestinal:  Negative for abdominal pain and blood in stool.  Neurological:  Negative for weakness.    Physical Exam Updated Vital Signs BP 124/89   Pulse 60   Temp 98.5 F (36.9 C)   Resp 18   Ht 5\' 8"  (1.727 m)   Wt 60.8 kg   SpO2 100%   BMI 20.37 kg/m  Physical Exam Vitals and nursing note reviewed.  Constitutional:      Appearance: She is well-developed.  HENT:     Head: Normocephalic and atraumatic.  Cardiovascular:     Rate and Rhythm: Normal rate and regular rhythm.     Heart sounds: Murmur heard.  Pulmonary:     Effort: Pulmonary effort is normal.     Breath sounds: Normal breath sounds.  Abdominal:     Palpations: Abdomen is soft.     Tenderness: There is no abdominal tenderness.  Genitourinary:    Rectum: Guaiac result positive. No external hemorrhoid.     Comments: No gross blood or melena Skin:    General: Skin is warm and dry.  Neurological:     Mental Status: She is alert.     ED Results / Procedures / Treatments   Labs (all labs ordered are listed, but only abnormal results are displayed) Labs Reviewed  COMPREHENSIVE METABOLIC PANEL WITH GFR - Abnormal; Notable for the following components:      Result Value   Glucose, Bld 100 (*)    Creatinine, Ser 1.63 (*)    Calcium  10.7 (*)    Albumin 3.2 (*)    GFR, Estimated 31 (*)    All other components within normal limits  CBC WITH DIFFERENTIAL/PLATELET - Abnormal; Notable for the following components:   WBC 3.5 (*)    RBC 3.05 (*)    Hemoglobin 7.6 (*)    HCT 25.6 (*)    MCH 24.9 (*)    MCHC 29.7 (*)    RDW 16.9 (*)    All other components within normal  limits  PROTIME-INR - Abnormal; Notable for the following components:   Prothrombin Time 16.1 (*)    INR 1.3 (*)    All other components within normal limits  POC OCCULT BLOOD, ED - Abnormal; Notable for the following components:   Fecal Occult Bld POSITIVE (*)    All other components within normal limits  VITAMIN B12  FOLATE  IRON AND TIBC  FERRITIN  RETICULOCYTES  TYPE AND SCREEN    EKG None  Radiology No results  found.  Procedures Procedures    Medications Ordered in ED Medications - No data to display  ED Course/ Medical Decision Making/ A&P                                 Medical Decision Making Amount and/or Complexity of Data Reviewed External Data Reviewed: labs and notes. Labs: ordered.    Details: Hemoglobin 7.6, was 8 yesterday. Radiology: ordered.  Risk Decision regarding hospitalization.   Patient presents with worsening anemia.  Hemoglobin seems to be around 10 at baseline.  Hemoccult is positive though there was no melena or bloody stool on exam.  Otherwise, patient is well-appearing with stable vitals.  She will need admission for monitoring and workup of her anemia, which could be from blood loss.  I have consulted Dr. Lavaughn Portland of GI, who asks for a CT abd and pelvis, which is ordered and pending. Otherwise, discussed case with Dr. Lydia Sams, who will admit.        Final Clinical Impression(s) / ED Diagnoses Final diagnoses:  Anemia, unspecified type    Rx / DC Orders ED Discharge Orders     None         Jerilynn Montenegro, MD 03/09/24 1858

## 2024-03-09 NOTE — Assessment & Plan Note (Addendum)
 Lab Results  Component Value Date   CREATININE 1.63 (H) 03/09/2024   CREATININE 1.84 (H) 03/08/2024   CREATININE 1.58 (H) 09/27/2023  Kidney function is stable, will avoid contrast NSAIDs renally dose meds.

## 2024-03-09 NOTE — Telephone Encounter (Signed)
 We discussed this with her and we sent her to the ED

## 2024-03-10 ENCOUNTER — Encounter (HOSPITAL_COMMUNITY): Payer: Self-pay | Admitting: Internal Medicine

## 2024-03-10 DIAGNOSIS — I10 Essential (primary) hypertension: Secondary | ICD-10-CM

## 2024-03-10 DIAGNOSIS — I480 Paroxysmal atrial fibrillation: Secondary | ICD-10-CM

## 2024-03-10 DIAGNOSIS — D500 Iron deficiency anemia secondary to blood loss (chronic): Secondary | ICD-10-CM | POA: Diagnosis not present

## 2024-03-10 DIAGNOSIS — R634 Abnormal weight loss: Secondary | ICD-10-CM

## 2024-03-10 DIAGNOSIS — I495 Sick sinus syndrome: Secondary | ICD-10-CM | POA: Diagnosis not present

## 2024-03-10 DIAGNOSIS — K922 Gastrointestinal hemorrhage, unspecified: Secondary | ICD-10-CM | POA: Diagnosis not present

## 2024-03-10 DIAGNOSIS — N1832 Chronic kidney disease, stage 3b: Secondary | ICD-10-CM

## 2024-03-10 DIAGNOSIS — I2510 Atherosclerotic heart disease of native coronary artery without angina pectoris: Secondary | ICD-10-CM

## 2024-03-10 LAB — COMPREHENSIVE METABOLIC PANEL WITH GFR
ALT: 12 U/L (ref 0–44)
AST: 24 U/L (ref 15–41)
Albumin: 3.1 g/dL — ABNORMAL LOW (ref 3.5–5.0)
Alkaline Phosphatase: 66 U/L (ref 38–126)
Anion gap: 9 (ref 5–15)
BUN: 20 mg/dL (ref 8–23)
CO2: 23 mmol/L (ref 22–32)
Calcium: 10.8 mg/dL — ABNORMAL HIGH (ref 8.9–10.3)
Chloride: 107 mmol/L (ref 98–111)
Creatinine, Ser: 1.7 mg/dL — ABNORMAL HIGH (ref 0.44–1.00)
GFR, Estimated: 29 mL/min — ABNORMAL LOW (ref >60)
Glucose, Bld: 93 mg/dL (ref 70–99)
Potassium: 3.3 mmol/L — ABNORMAL LOW (ref 3.5–5.1)
Sodium: 139 mmol/L (ref 135–145)
Total Bilirubin: 1.5 mg/dL — ABNORMAL HIGH (ref 0.0–1.2)
Total Protein: 7.2 g/dL (ref 6.5–8.1)

## 2024-03-10 LAB — TYPE AND SCREEN
ABO/RH(D): B POS
Antibody Screen: NEGATIVE
Unit division: 0

## 2024-03-10 LAB — BPAM RBC
Blood Product Expiration Date: 202506012359
ISSUE DATE / TIME: 202505062016
Unit Type and Rh: 7300

## 2024-03-10 LAB — CBC
HCT: 29 % — ABNORMAL LOW (ref 36.0–46.0)
Hemoglobin: 9.3 g/dL — ABNORMAL LOW (ref 12.0–15.0)
MCH: 26.4 pg (ref 26.0–34.0)
MCHC: 32.1 g/dL (ref 30.0–36.0)
MCV: 82.4 fL (ref 80.0–100.0)
Platelets: 195 10*3/uL (ref 150–400)
RBC: 3.52 MIL/uL — ABNORMAL LOW (ref 3.87–5.11)
RDW: 17.9 % — ABNORMAL HIGH (ref 11.5–15.5)
WBC: 5.1 10*3/uL (ref 4.0–10.5)
nRBC: 0 % (ref 0.0–0.2)

## 2024-03-10 LAB — MAGNESIUM: Magnesium: 1.8 mg/dL (ref 1.7–2.4)

## 2024-03-10 MED ORDER — HYDRALAZINE HCL 25 MG PO TABS: 25.0000 mg | ORAL_TABLET | Freq: Four times a day (QID) | ORAL | Status: DC | PRN

## 2024-03-10 MED ORDER — BOOST / RESOURCE BREEZE PO LIQD CUSTOM
1.0000 | Freq: Three times a day (TID) | ORAL | Status: DC
  Administered 2024-03-10 – 2024-03-11 (×4): 1 via ORAL

## 2024-03-10 MED ORDER — SODIUM CHLORIDE 0.9 % IV SOLN
300.0000 mg | Freq: Once | INTRAVENOUS | Status: AC
  Administered 2024-03-10: 300 mg via INTRAVENOUS
  Filled 2024-03-10: qty 15

## 2024-03-10 MED ORDER — ISOSORBIDE MONONITRATE ER 30 MG PO TB24
30.0000 mg | ORAL_TABLET | Freq: Every day | ORAL | Status: DC
  Administered 2024-03-10 – 2024-03-11 (×2): 30 mg via ORAL
  Filled 2024-03-10 (×2): qty 1

## 2024-03-10 MED ORDER — HYDRALAZINE HCL 50 MG PO TABS
50.0000 mg | ORAL_TABLET | Freq: Two times a day (BID) | ORAL | Status: DC
  Administered 2024-03-10 – 2024-03-11 (×3): 50 mg via ORAL
  Filled 2024-03-10 (×3): qty 1

## 2024-03-10 MED ORDER — LORAZEPAM 0.5 MG PO TABS: 0.5000 mg | ORAL_TABLET | Freq: Four times a day (QID) | ORAL | Status: DC | PRN

## 2024-03-10 MED ORDER — POTASSIUM CHLORIDE CRYS ER 20 MEQ PO TBCR
60.0000 meq | EXTENDED_RELEASE_TABLET | Freq: Once | ORAL | Status: AC
Start: 2024-03-10 — End: 2024-03-10
  Administered 2024-03-10: 60 meq via ORAL
  Filled 2024-03-10: qty 3

## 2024-03-10 MED ORDER — ACETAMINOPHEN 325 MG PO TABS
650.0000 mg | ORAL_TABLET | ORAL | Status: AC
  Administered 2024-03-10: 650 mg via ORAL
  Filled 2024-03-10: qty 2

## 2024-03-10 MED ORDER — POTASSIUM CHLORIDE CRYS ER 20 MEQ PO TBCR
60.0000 meq | EXTENDED_RELEASE_TABLET | Freq: Once | ORAL | Status: DC
  Filled 2024-03-10: qty 3

## 2024-03-10 MED ORDER — LACTATED RINGERS IV SOLN: INTRAVENOUS | Status: DC

## 2024-03-10 MED ORDER — SODIUM CHLORIDE 0.9 % IV SOLN: INTRAVENOUS | Status: DC

## 2024-03-10 NOTE — Consult Note (Signed)
 Reason for Consult: Guaiac positive anemia Referring Physician: Hospital team  Barbara [name] is an 83 y.o. female.  HPI: Patient seen and examined and her hospital computer chart in our office computer chart was reviewed and other than chronic constipation and sometimes a little bright red blood on the toilet tissue she has no bowel issues although she has lost over 20 pounds over the last year or 2 and says she eats only 2 meals a day since she does not work but has no swallowing problems abdominal pain heartburn indigestion etc. and she has not seen any black stools or other blood in her stools and her family history is negative from a GI standpoint and her case was discussed with a cousin caretaker and she is on both Eliquis  and aspirin  at home and her last colonoscopy was 10 years ago and normal and we discussed her CT scan which was okay and she has no other complaints and we answered all of their questions and we did discuss her constipation  Past Medical History:  Diagnosis Date   A-fib (HCC)    Allergy    CAD (coronary artery disease)    sees Dr. Magnus Schuller  cardiac stents - 2000   CHF (congestive heart failure) (HCC)    Colon polyps    Complication of anesthesia    rash/hives with "caines"   Dyspnea    02/12/18 " when my heart gets out of rhythm, it has not been out of rhythym- since I have been on Tikosyn  (11/2017)   Dysrhythmia    afib fib   GERD (gastroesophageal reflux disease)    takes OTC- Omeprazole - prn   Heart murmur    History of kidney stones    History of stress test    show normal perfusion without scar or ischemia, post EF 68%   Hx of echocardiogram    show an EF 55%-60% range with grade 1 diastolic dysfunction, she had mitral anular calcification with mild MR, moderate LA dilation and mild pulmonary hypertension with a PA estimated pressure of 39mm   Hyperlipidemia    Hypertension    NSTEMI (non-ST elevated myocardial infarction) (HCC)    Osteoarthritis     Pacemaker    Pneumonia    hx of 2015    PONV (postoperative nausea and vomiting)    Sleep apnea     Past Surgical History:  Procedure Laterality Date   CARDIAC CATHETERIZATION     11/2017   cardiac stents  2000   COLONOSCOPY  01-05-14   per Dr. Delilah Fend, clear, no repeats needed    COLONOSCOPY     CORONARY STENT PLACEMENT  2000   in LAD   CYSTOSCOPY WITH RETROGRADE PYELOGRAM, URETEROSCOPY AND STENT PLACEMENT Left 06/11/2022   Procedure: CYSTOSCOPY WITH RETROGRADE PYELOGRAM, LEFT STENT PLACEMENT;  Surgeon: Samson Croak, MD;  Location: [facility];  Service: Urology;  Laterality: Left;   CYSTOSCOPY/URETEROSCOPY/HOLMIUM LASER/STENT PLACEMENT Left 06/25/2022   Procedure: CYSTOSCOPY LEFT URETEROSCOPY/HOLMIUM LASER/STENT PLACEMENT;  Surgeon: Homero Luster, MD;  Location: [facility];  Service: Urology;  Laterality: Left;  1 HR FOR THIS CASE   DIRECT LARYNGOSCOPY WITH RADIAESSE INJECTION N/A 02/13/2018   Procedure: DIRECT LARYNGOSCOPY WITH RADIAESSE INJECTION;  Surgeon: Virgina Grills, MD;  Location: [facility] OR;  Service: ENT;  Laterality: N/A;   EYE SURGERY Left    CATARACT REMOVAL   HIP ARTHROPLASTY Left 09/27/2023   Procedure: ARTHROPLASTY BIPOLAR HIP (HEMIARTHROPLASTY);  Surgeon: Saundra Curl, MD;  Location: [facility]  OR;  Service: Orthopedics;  Laterality: Left;   KNEE ARTHROSCOPY Left 01/06/2015   Procedure: LEFT KNEE ARTHROSCOPY, abrasion chondroplasty of the medial femerol condryl,medial and lateral menisectomy, microfracture , synovectomy of the suprpatellar pouch;  Surgeon: Hazle Lites, MD;  Location: [facility];  Service: Orthopedics;  Laterality: Left;   LEFT HEART CATH AND CORONARY ANGIOGRAPHY N/A 02/26/2019   Procedure: LEFT HEART CATH AND CORONARY ANGIOGRAPHY;  Surgeon: Millicent Ally, MD;  Location: [facility];  Service: Cardiovascular;  Laterality: N/A;   LEFT HEART CATH AND CORONARY ANGIOGRAPHY N/A 05/06/2022   Procedure: LEFT HEART CATH AND CORONARY ANGIOGRAPHY;  Surgeon: Sammy Crisp, MD;  Location: [facility];  Service: Cardiovascular;  Laterality: N/A;   MICROLARYNGOSCOPY W/VOCAL CORD INJECTION N/A 08/07/2018   Procedure: MICROLARYNGOSCOPY WITH VOCAL CORD INJECTION OF PROLARYN;  Surgeon: Virgina Grills, MD;  Location: [facility] OR;  Service: ENT;  Laterality: N/A;  JET VENTILATION   PACEMAKER IMPLANT N/A 05/08/2022   Procedure: PACEMAKER IMPLANT;  Surgeon: Lei Pump, MD;  Location: [facility];  Service: Cardiovascular;  Laterality: N/A;   REVERSE SHOULDER ARTHROPLASTY Right 03/23/2020   Procedure: REVERSE SHOULDER ARTHROPLASTY;  Surgeon: Janeth Medicus, MD;  Location: [facility] OR;  Service: Orthopedics;  Laterality: Right;   VAGINAL HYSTERECTOMY  1971    Family History  Problem Relation Age of Onset   Cancer Maternal Grandmother    Heart disease Maternal Grandfather     Social History:  reports that she quit smoking about 27 years ago. Her smoking use included cigarettes. She started smoking about 67 years ago. She has never used smokeless tobacco. She reports that she does not drink alcohol and does not use drugs.  Allergies:  Allergies  Allergen Reactions   Lidocaine  Anaphylaxis   Morphine  Other (See Comments)    "Body shuts down"   Procaine Hcl Anaphylaxis, Rash and Other (See Comments)    "Anything with 'caine' in it "   Sulfonamide Derivatives Hives   Amlodipine  Swelling   Norco [Hydrocodone -Acetaminophen ] Nausea And Vomiting and Other (See Comments)    States does ok with IV form    Tramadol  Nausea And Vomiting   Vytorin [Ezetimibe-Simvastatin] Other (See Comments)    Joint pain   Tape Itching and Other (See Comments)    Patient prefers either paper tape or Coban wrap    Medications: I have reviewed the patient's current medications.  Results for orders placed or performed during the hospital encounter of 03/09/24 (from the past 48 hours)  Comprehensive metabolic panel     Status: Abnormal   Collection Time: 03/09/24   4:30 PM  Result Value Ref Range   Sodium 139 135 - 145 mmol/L   Potassium 3.8 3.5 - 5.1 mmol/L   Chloride 105 98 - 111 mmol/L   CO2 23 22 - 32 mmol/L   Glucose, Bld 100 (H) 70 - 99 mg/dL    Comment: Glucose reference range applies only to samples taken after fasting for at least 8 hours.   BUN 23 8 - 23 mg/dL   Creatinine, Ser 0.45 (H) 0.44 - 1.00 mg/dL   Calcium  10.7 (H) 8.9 - 10.3 mg/dL   Total Protein 7.3 6.5 - 8.1 g/dL   Albumin 3.2 (L) 3.5 - 5.0 g/dL   AST 25 15 - 41 U/L   ALT 11 0 - 44 U/L   Alkaline Phosphatase 62 38 - 126 U/L   Total Bilirubin 0.7 0.0 - 1.2 mg/dL   GFR,  Estimated 31 (L) >60 mL/min    Comment: (NOTE) Calculated using the CKD-EPI Creatinine Equation (2021)    Anion gap 11 5 - 15    Comment: Performed at [facility] Lab, [address] [place], [place] [address]  CBC with Differential     Status: Abnormal   Collection Time: 03/09/24  4:30 PM  Result Value Ref Range   WBC 3.5 (L) 4.0 - 10.5 K/uL   RBC 3.05 (L) 3.87 - 5.11 MIL/uL   Hemoglobin 7.6 (L) 12.0 - 15.0 g/dL   HCT 60.4 (L) 54.0 - 98.1 %   MCV 83.9 80.0 - 100.0 fL   MCH 24.9 (L) 26.0 - 34.0 pg   MCHC 29.7 (L) 30.0 - 36.0 g/dL   RDW 19.1 (H) 47.8 - 29.5 %   Platelets 196 150 - 400 K/uL   nRBC 0.0 0.0 - 0.2 %   Neutrophils Relative % 66 %   Neutro Abs 2.3 1.7 - 7.7 K/uL   Lymphocytes Relative 20 %   Lymphs Abs 0.7 0.7 - 4.0 K/uL   Monocytes Relative 11 %   Monocytes Absolute 0.4 0.1 - 1.0 K/uL   Eosinophils Relative 3 %   Eosinophils Absolute 0.1 0.0 - 0.5 K/uL   Basophils Relative 0 %   Basophils Absolute 0.0 0.0 - 0.1 K/uL   Immature Granulocytes 0 %   Abs Immature Granulocytes 0.01 0.00 - 0.07 K/uL    Comment: Performed at [facility] Lab, [address] [place], [place] [address]  Protime-INR     Status: Abnormal   Collection Time: 03/09/24  4:30 PM  Result Value Ref Range   Prothrombin Time 16.1 (H) 11.4 - 15.2 seconds   INR 1.3 (H) 0.8 - 1.2    Comment: (NOTE) INR goal  varies based on device and disease states. Performed at [facility] Lab, [address] [place], [place] [address]   Type and screen [facility]     Status: None   Collection Time: 03/09/24  4:34 PM  Result Value Ref Range   ABO/RH(D) B POS    Antibody Screen NEG    Sample Expiration 03/12/2024,2359    Unit Number [id number]    Blood Component Type RED CELLS,LR    Unit division 00    Status of Unit ISSUED,FINAL    Transfusion Status OK TO TRANSFUSE    Crossmatch Result      Compatible Performed at [facility] Lab, [address] [place], [place] [address]   POC occult blood, ED     Status: Abnormal   Collection Time: 03/09/24  5:30 PM  Result Value Ref Range   Fecal Occult Bld POSITIVE (A) NEGATIVE  TSH     Status: None   Collection Time: 03/09/24  6:27 PM  Result Value Ref Range   TSH 2.515 0.350 - 4.500 uIU/mL    Comment: Performed by a 3rd Generation assay with a functional sensitivity of <=0.01 uIU/mL. Performed at [facility] Lab, [address] [place], [place] [address]   T4, free     Status: Abnormal   Collection Time: 03/09/24  6:27 PM  Result Value Ref Range   Free T4 1.36 (H) 0.61 - 1.12 ng/dL    Comment: (NOTE) Biotin ingestion may interfere with free T4 tests. If the results are inconsistent with the TSH level, previous test results, or the clinical presentation, then consider biotin interference. If needed, order repeat testing after stopping biotin. Performed  at [facility] Lab, [address] [place], [place] [address]   Vitamin B12     Status: None   Collection Time: 03/09/24  6:29 PM  Result Value Ref Range   Vitamin B-12 302 180 - 914 pg/mL    Comment: (NOTE) This assay is not validated for testing neonatal or myeloproliferative syndrome specimens for Vitamin B12 levels. Performed at [facility] Lab, [address] [place], [place] [address]   Folate     Status: None   Collection Time: 03/09/24  6:29 PM   Result Value Ref Range   Folate 19.5 >5.9 ng/mL    Comment: Performed at [facility] Lab, [address], [place], [place] [address]  Iron and TIBC     Status: Abnormal   Collection Time: 03/09/24  6:29 PM  Result Value Ref Range   Iron 29 28 - 170 ug/dL   TIBC 725 366 - 440 ug/dL   Saturation Ratios 7 (L) 10.4 - 31.8 %   UIBC 394 ug/dL    Comment: Performed at [facility], [address] [place], [place] [address]  Ferritin     Status: None   Collection Time: 03/09/24  6:29 PM  Result Value Ref Range   Ferritin 44 11 - 307 ng/mL    Comment: Performed at [facility] Lab, [address] [place], [place] [address]  Reticulocytes     Status: Abnormal   Collection Time: 03/09/24  6:29 PM  Result Value Ref Range   Retic Ct Pct 2.4 0.4 - 3.1 %   RBC. 1.37 (L) 3.87 - 5.11 MIL/uL   Retic Count, Absolute 32.5 19.0 - 186.0 K/uL   Immature Retic Fract 16.8 (H) 2.3 - 15.9 %    Comment: Performed at [facility] Lab, [address] [place], [place] [address]  Prepare RBC (crossmatch)     Status: None   Collection Time: 03/09/24  7:42 PM  Result Value Ref Range   Order Confirmation      ORDER PROCESSED BY BLOOD BANK Performed at [facility], [address], [place], [place] [address]   Magnesium      Status: None   Collection Time: 03/10/24  6:02 AM  Result Value Ref Range   Magnesium  1.8 1.7 - 2.4 mg/dL    Comment: Performed at [facility] Lab, [address], [place], [place] [address]  Comprehensive metabolic panel     Status: Abnormal   Collection Time: 03/10/24  6:05 AM  Result Value Ref Range   Sodium 139 135 - 145 mmol/L   Potassium 3.3 (L) 3.5 - 5.1 mmol/L   Chloride 107 98 - 111 mmol/L   CO2 23 22 - 32 mmol/L   Glucose, Bld 93 70 - 99 mg/dL    Comment: Glucose reference range applies only to samples taken after fasting for at least 8 hours.   BUN 20 8 - 23 mg/dL   Creatinine, Ser 6.60 (H) 0.44 - 1.00 mg/dL   Calcium  10.8 (H) 8.9 - 10.3 mg/dL    Total Protein 7.2 6.5 - 8.1 g/dL   Albumin 3.1 (L) 3.5 - 5.0 g/dL   AST 24 15 - 41 U/L   ALT 12 0 - 44 U/L   Alkaline Phosphatase 66 38 - 126 U/L   Total Bilirubin 1.5 (H) 0.0 - 1.2 mg/dL   GFR, Estimated 29 (L) >60 mL/min    Comment: (NOTE) Calculated using the CKD-EPI Creatinine Equation (  2021)    Anion gap 9 5 - 15    Comment: Performed at [facility], [address] [place], [place] [address]  CBC     Status: Abnormal   Collection Time: 03/10/24  6:05 AM  Result Value Ref Range   WBC 5.1 4.0 - 10.5 K/uL   RBC 3.52 (L) 3.87 - 5.11 MIL/uL   Hemoglobin 9.3 (L) 12.0 - 15.0 g/dL   HCT 28.4 (L) 13.2 - 44.0 %   MCV 82.4 80.0 - 100.0 fL   MCH 26.4 26.0 - 34.0 pg   MCHC 32.1 30.0 - 36.0 g/dL   RDW 10.2 (H) 72.5 - 36.6 %   Platelets 195 150 - 400 K/uL   nRBC 0.0 0.0 - 0.2 %    Comment: Performed at [facility] Lab, [address], [place], [place] [address]    CT ABDOMEN PELVIS WO CONTRAST Result Date: 03/09/2024 CLINICAL DATA:  Weight loss EXAM: CT ABDOMEN AND PELVIS WITHOUT CONTRAST TECHNIQUE: Multidetector CT imaging of the abdomen and pelvis was performed following the standard protocol without IV contrast. RADIATION DOSE REDUCTION: This exam was performed according to the departmental dose-optimization program which includes automated exposure control, adjustment of the mA and/or kV according to patient size and/or use of iterative reconstruction technique. COMPARISON:  CT abdomen and pelvis 06/09/2021. FINDINGS: Lower chest: There are atelectatic changes in the lung bases. 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 Hepatobiliary: No focal liver abnormality is seen. No gallstones, gallbladder wall thickening, or biliary dilatation. Pancreas: Unremarkable. No pancreatic ductal dilatation or surrounding inflammatory changes. Spleen: Normal in size without focal abnormality. Adrenals/Urinary Tract: Right renal cysts are present measuring up to 3.5 cm. Left renal cysts are present  measuring up to 2.3 cm. No urinary tract calculi. No hydronephrosis. Adrenal glands and bladder are within normal limits. There is limited evaluation of bladder secondary to streak artifact in the pelvis. Stomach/Bowel: Stomach is within normal limits. Appendix appears normal. No evidence of bowel wall thickening, distention, or inflammatory changes. There are scattered colonic diverticula. Vascular/Lymphatic: Aortic atherosclerosis. No enlarged abdominal or pelvic lymph nodes. Reproductive: Status post hysterectomy. No adnexal masses. Other: No abdominal wall hernia or abnormality. No abdominopelvic ascites. Musculoskeletal: Degenerative changes affect the spine. Left hip arthroplasty is present. IMPRESSION: 1. No acute localizing process in the abdomen or pelvis. 2. Bilateral renal cysts. No follow-up imaging recommended. 3. Colonic diverticulosis without evidence for diverticulitis. 4. Aortic atherosclerosis. Aortic Atherosclerosis (ICD10-I70.0). Electronically Signed   By: Tyron Gallon M.D.   On: 03/09/2024 19:58    ROS negative except above she was having some fatigue over the last few weeks Blood pressure (!) 141/83, pulse 60, temperature 98.3 F (36.8 C), temperature source Oral, resp. rate 17, height 5\' 7"  (1.702 m), weight 60.7 kg, SpO2 96%. Physical Exam Vital signs stable afebrile no acute distress abdomen is soft nontender BUN okay creatinine chronically elevated LFTs okay percent sat a little low ferritin low normal other iron studies okay hemoglobin down to 7.6 with transfusion 9.3 MCV 79 Assessment/Plan: Guaiac positive anemia patient on aspirin  and Eliquis  Plan: The risk benefits methods of endoscopy was discussed and we will proceed tomorrow and she will need not only and evaluation to see does she truly need both aspirin  and Eliquis  going forward but the family requests an evaluation of all of her medicines and we did discuss if she continues to lose blood the endoscopy is not revealing  she might need a colonoscopy at some point  but they are comfortable holding off for now and seeing how she does as an outpatient and I will allow soft solids and proceed with endoscopy as above tomorrow morning  Brandom Kerwin E 03/10/2024, 12:40 PM

## 2024-03-10 NOTE — Progress Notes (Signed)
   03/10/24 0510  Vitals  Temp (!) 100.4 F (38 C)  Temp Source Oral  BP (!) 176/64  MAP (mmHg) 95  BP Location Left Arm  BP Method Automatic  Patient Position (if appropriate) Lying  Pulse Rate 65  Pulse Rate Source Monitor  Resp 16  MEWS COLOR  MEWS Score Color Green  Oxygen Therapy  SpO2 90 %  O2 Device Room Air  MEWS Score  MEWS Temp 0  MEWS Systolic 0  MEWS Pulse 0  MEWS RR 0  MEWS LOC 0  MEWS Score 0  Provider Notification  Provider Name/Title Reesa Cannon, Md  Date Provider Notified 03/10/24  Time Provider Notified [phone]  Method of Notification  (secure chat)  Notification Reason Other (Comment) (to update vital signs)  Provider response See new orders (tyleonol for fever)  Date of Provider Response 03/10/24  Time of Provider Response 0518    Pt A&O x 4. On room air.  Denies pain. No bleeding noted. Dr. Reesa Cannon aware and New order for po tylenol  for fever and given as ordered. PRN IV hydralazine  given as ordered.   Will continue to monitor.

## 2024-03-10 NOTE — Progress Notes (Signed)
 PROGRESS NOTE  Barbara [name] AVW:[id number] DOB: [birth year]   PCP: Donley Furth, MD  Patient is from: Home.  Lives alone.  Uses cane and rolling walker at baseline.  DOA: 03/09/2024 LOS: 1  Chief complaints Chief Complaint  Patient presents with   Abnormal Lab    Hemoglobin      Brief Narrative / Interim history: 83 year old F with PMH of HFpEF, CAD, CKD-3B, SSS/PPM, A-fib on Eliquis , HTN, HLD and anxiety sent to ED by PCP due to anemia/low hemoglobin.  Seen by PCP on 5/5 for chronic fatigue, stiff joints, muscle aches and weight loss.  Reportedly lost about 40 pounds over the past 1 year.  Patient denies melena but notes some fresh blood when she wipes that she attributes to constipation and hemorrhoid.  She is on Eliquis  for A-fib and also takes baby aspirin .  Hemoglobin was 8.0 at PCP office (baseline 10-11).   In ED, slightly hypotensive to 118/54 but improved quickly.  Hgb 7.6.  WBC 3.5.  Hemoccult positive. Cr 1.63.  Calcium  10.7.  Albumin 3.2.  One unit of blood ordered.  GI consulted, and admitted.  Hgb improved to 9.3 after 1 unit.  Anemia panel with significant iron deficiency.  Subjective: Seen and examined earlier this morning.  No major events overnight or this morning.  No complaints other than lack of sleep.  Denies chest pain, shortness of breath, dizziness or palpitation.  Last bowel movement yesterday morning.  She denies melena but noted some red blood when she wiped that she attributes to constipation.  Objective: Vitals:   03/10/24 0510 03/10/24 0518 03/10/24 0558 03/10/24 0800  BP: (!) 176/64 (!) 176/64 (!) 165/61 (!) 141/83  Pulse: 65  (!) 59 60  Resp: 16   17  Temp: (!) 100.4 F (38 C)  100.1 F (37.8 C) 98.3 F (36.8 C)  TempSrc: Oral  Oral Oral  SpO2: 90%  94% 96%  Weight:      Height:        Examination:  GENERAL: No apparent distress.  Nontoxic. HEENT: MMM.  Vision and hearing grossly intact.  NECK: Supple.  No apparent JVD.  RESP:   No IWOB.  Fair aeration bilaterally. CVS:  RRR. Heart sounds normal.  ABD/GI/GU: BS+. Abd soft, NTND.  MSK/EXT:  Moves extremities. No apparent deformity. No edema.  SKIN: no apparent skin lesion or wound NEURO: Awake, alert and oriented appropriately.  No apparent focal neuro deficit. PSYCH: Calm. Normal affect.   Consultants:  Gastroenterology  Procedures: None  Microbiology summarized: None  Assessment and plan: Symptomatic anemia likely due to slow GI bleed and CKD.  No report of melena or hematochezia other than small blood when she wipes.  Patient is on Eliquis  and low-dose aspirin .  Transfused 1 unit with appropriate response.  Anemia panel with iron deficiency. Recent Labs    09/26/23 1248 09/27/23 0519 03/08/24 1519 03/09/24 1630 03/10/24 0605  HGB 10.9* 10.4* 8.0 Repeated and verified X2.* 7.6* 9.3*  -Continue holding Eliquis  and aspirin  -Continue IV Protonix  40 mg twice daily -IV Venofer 300 mg x 1 -On clear liquid diet. -Follow GI recommendation - Monitor H&H.  Transfuse for Hgb less than 8.0  CKD-3B: Relatively stable.  Not on nephrotoxic meds. Recent Labs    09/26/23 1248 09/27/23 0519 03/08/24 1519 03/09/24 1630 03/10/24 0605  BUN 17 22 23 23 20   CREATININE 1.53* 1.58* 1.84* 1.63* 1.70*  -Gentle IV fluid probably for hypercalcemia - Continue monitoring   Chronic HFpEF:  TTE in 2023 with LVEF > 75%, indeterminate DD and normal RV SF.  Appears euvolemic on exam.  No cardiopulmonary symptoms.  Not on diuretics at home -Monitor fluid status while on IV fluid - Hold home Jardiance .  History of CAD: Stable.  No cardiopulmonary symptoms - Resume home Imdur  - Continue home Toprol -XL at lower dose given borderline heart rate - Continue home Crestor  - Hold aspirin   Essential hypertension: BP elevated this morning. - Resumed home Imdur  - Toprol -XL and hydralazine  at reduced dose - Continue monitoring  Paroxysmal A-fib: Rate controlled -Toprol -XL as  above -Hold Eliquis   SSS/PPM  Hyperlipidemia - Continue statin  Hypercalcemia - IV fluid  Hypokalemia - Monitor replenish as appropriate  Generalized weakness/fatigue: Probably due to anemia.  Thyroid  panel normal. -Treat treatable causes - OOB/PT/OT  Body mass index is 20.96 kg/m.          DVT prophylaxis:  SCDs Start: 03/09/24 1940  Code Status: Full code Family Communication: None at bedside Level of care: Telemetry Medical Status is: Inpatient Remains inpatient appropriate because: Symptomatic anemia, hypercalcemia and physical deconditioning   Final disposition: To be determined   55 minutes with more than 50% spent in reviewing records, counseling patient/family and coordinating care.   Sch Meds:  Scheduled Meds:  feeding supplement  1 Container Oral TID BM   hydrALAZINE   50 mg Oral BID   isosorbide  mononitrate  30 mg Oral Daily   metoprolol  succinate  25 mg Oral QHS   pantoprazole  (PROTONIX ) IV  40 mg Intravenous Q12H   potassium chloride   60 mEq Oral Once   rosuvastatin   40 mg Oral QHS   sodium chloride  flush  3-10 mL Intravenous Q12H   Continuous Infusions:  iron sucrose 300 mg (03/10/24 1055)   lactated ringers  60 mL/hr at 03/10/24 0826   PRN Meds:.hydrALAZINE , LORazepam , ondansetron  **OR** ondansetron  (ZOFRAN ) IV, sodium chloride  flush  Antimicrobials: Anti-infectives (From admission, onward)    None        I have personally reviewed the following labs and images: CBC: Recent Labs  Lab 03/08/24 1519 03/09/24 1630 03/10/24 0605  WBC 2.8* 3.5* 5.1  NEUTROABS 2.1 2.3  --   HGB 8.0 Repeated and verified X2.* 7.6* 9.3*  HCT 25.8* 25.6* 29.0*  MCV 79.0 83.9 82.4  PLT 183.0 196 195   BMP &GFR Recent Labs  Lab 03/08/24 1519 03/09/24 1630 03/10/24 0602 03/10/24 0605  NA 139 139  --  139  K 3.6 3.8  --  3.3*  CL 106 105  --  107  CO2 26 23  --  23  GLUCOSE 101* 100*  --  93  BUN 23 23  --  20  CREATININE 1.84* 1.63*  --   1.70*  CALCIUM  10.4 10.7*  --  10.8*  MG  --   --  1.8  --    Estimated Creatinine Clearance: 23.6 mL/min (A) (by C-G formula based on SCr of 1.7 mg/dL (H)). Liver & Pancreas: Recent Labs  Lab 03/08/24 1519 03/09/24 1630 03/10/24 0605  AST 23 25 24   ALT 10 11 12   ALKPHOS 67 62 66  BILITOT 0.7 0.7 1.5*  PROT 7.4 7.3 7.2  ALBUMIN 3.6 3.2* 3.1*   No results for input(s): "LIPASE", "AMYLASE" in the last 168 hours. No results for input(s): "AMMONIA" in the last 168 hours. Diabetic: Recent Labs    03/08/24 1519  HGBA1C 5.5   No results for input(s): "GLUCAP" in the last 168 hours. Cardiac Enzymes: Recent  Labs  Lab 03/08/24 1519  CKTOTAL 55   No results for input(s): "PROBNP" in the last 8760 hours. Coagulation Profile: Recent Labs  Lab 03/09/24 1630  INR 1.3*   Thyroid  Function Tests: Recent Labs    03/09/24 1827  TSH 2.515  FREET4 1.36*   Lipid Profile: No results for input(s): "CHOL", "HDL", "LDLCALC", "TRIG", "CHOLHDL", "LDLDIRECT" in the last 72 hours. Anemia Panel: Recent Labs    03/08/24 1519 03/09/24 1829  VITAMINB12 210* 302  FOLATE  --  19.5  FERRITIN  --  44  TIBC  --  423  IRON  --  29  RETICCTPCT  --  2.4   Urine analysis:    Component Value Date/Time   COLORURINE YELLOW 06/09/2022 1426   APPEARANCEUR CLOUDY (A) 06/09/2022 1426   LABSPEC 1.010 06/09/2022 1426   PHURINE 6.5 06/09/2022 1426   GLUCOSEU NEGATIVE 06/09/2022 1426   GLUCOSEU NEGATIVE 09/13/2021 1203   HGBUR LARGE (A) 06/09/2022 1426   HGBUR negative 10/17/2010 0859   BILIRUBINUR NEGATIVE 06/09/2022 1426   BILIRUBINUR neg 08/01/2021 1037   KETONESUR NEGATIVE 06/09/2022 1426   PROTEINUR NEGATIVE 06/09/2022 1426   UROBILINOGEN 0.2 09/13/2021 1203   NITRITE NEGATIVE 06/09/2022 1426   LEUKOCYTESUR TRACE (A) 06/09/2022 1426   Sepsis Labs: Invalid input(s): "PROCALCITONIN", "LACTICIDVEN"  Microbiology: No results found for this or any previous visit (from the past 240  hours).  Radiology Studies: CT ABDOMEN PELVIS WO CONTRAST Result Date: 03/09/2024 CLINICAL DATA:  Weight loss EXAM: CT ABDOMEN AND PELVIS WITHOUT CONTRAST TECHNIQUE: Multidetector CT imaging of the abdomen and pelvis was performed following the standard protocol without IV contrast. RADIATION DOSE REDUCTION: This exam was performed according to the departmental dose-optimization program which includes automated exposure control, adjustment of the mA and/or kV according to patient size and/or use of iterative reconstruction technique. COMPARISON:  CT abdomen and pelvis 06/09/2021. FINDINGS: Lower chest: There are atelectatic changes in the lung bases. 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 Hepatobiliary: No focal liver abnormality is seen. No gallstones, gallbladder wall thickening, or biliary dilatation. Pancreas: Unremarkable. No pancreatic ductal dilatation or surrounding inflammatory changes. Spleen: Normal in size without focal abnormality. Adrenals/Urinary Tract: Right renal cysts are present measuring up to 3.5 cm. Left renal cysts are present measuring up to 2.3 cm. No urinary tract calculi. No hydronephrosis. Adrenal glands and bladder are within normal limits. There is limited evaluation of bladder secondary to streak artifact in the pelvis. Stomach/Bowel: Stomach is within normal limits. Appendix appears normal. No evidence of bowel wall thickening, distention, or inflammatory changes. There are scattered colonic diverticula. Vascular/Lymphatic: Aortic atherosclerosis. No enlarged abdominal or pelvic lymph nodes. Reproductive: Status post hysterectomy. No adnexal masses. Other: No abdominal wall hernia or abnormality. No abdominopelvic ascites. Musculoskeletal: Degenerative changes affect the spine. Left hip arthroplasty is present. IMPRESSION: 1. No acute localizing process in the abdomen or pelvis. 2. Bilateral renal cysts. No follow-up imaging recommended. 3. Colonic diverticulosis without evidence  for diverticulitis. 4. Aortic atherosclerosis. Aortic Atherosclerosis (ICD10-I70.0). Electronically Signed   By: Tyron Gallon M.D.   On: 03/09/2024 19:58      Chinonso Linker T. Katya Rolston [facility]  If 7PM-7AM, please contact night-coverage [email or web address] 03/10/2024, 11:57 AM

## 2024-03-10 NOTE — Plan of Care (Signed)

## 2024-03-10 NOTE — Progress Notes (Signed)
   03/10/24 1947  Vitals  Temp 98.5 F (36.9 C)  Temp Source Oral  BP (!) 159/65  MAP (mmHg) 91  BP Location Right Arm  BP Method Automatic  Patient Position (if appropriate) Sitting  Pulse Rate 60  Resp 16  MEWS COLOR  MEWS Score Color Green  Oxygen Therapy  SpO2 98 %  O2 Device Room Air  MEWS Score  MEWS Temp 0  MEWS Systolic 0  MEWS Pulse 0  MEWS RR 0  MEWS LOC 0  MEWS Score 0    Patient is A&O x 4. A-paced on the monitor. Pt asymptomatic. NS @ 60 ml/hr infusing as ordered. Safety maintained. Bed alarm on. Call bell in reach.  Pt will be NPO after midnight for EGD and pt aware.

## 2024-03-10 NOTE — Progress Notes (Signed)
   03/10/24 0400  Vitals  BP (!) 174/64  MAP (mmHg) 93  BP Location Left Arm  BP Method Automatic  Patient Position (if appropriate) Lying  Pulse Rate 61  Pulse Rate Source Monitor  MEWS COLOR  MEWS Score Color Green  Oxygen Therapy  SpO2 95 %  O2 Device Room Air  MEWS Score  MEWS Temp 1  MEWS Systolic 0  MEWS Pulse 0  MEWS RR 0  MEWS LOC 0  MEWS Score 1  Provider Notification  Provider Name/Title Reesa Cannon. MD  Date Provider Notified 03/10/24  Time Provider Notified 0407  Method of Notification  (secure chat)  Notification Reason Other (Comment) (to update pt BP readings, temp 100.9)  Provider response No new orders  Date of Provider Response 03/10/24  Time of Provider Response 0410    Admission notes.  New pt came from ED. Pt is A&O x 4. Febrile, temp 100.9, and BP elevated. Repeat BP 174/64, pt asymptomatic. Denies pain. A-paced on the tele monitor. No bleeding noted. DR. Kennie Peach hall notified of vital signs and no new orders this time.  Educated pt on falls and safety precautions, call bell and plan of care, pt verbalizes understanding  2 RN skin assessment done with Burr Cary, LPN.  Safety maintained. Bed alarm on. Call bell in reach. Will continue to monitor.

## 2024-03-11 ENCOUNTER — Encounter (HOSPITAL_COMMUNITY)
Admission: EM | Disposition: A | Discharge status: Home / Self Care | Payer: Self-pay | Source: Home / Self Care | Attending: Student

## 2024-03-11 ENCOUNTER — Encounter (HOSPITAL_COMMUNITY): Payer: Self-pay | Admitting: Internal Medicine

## 2024-03-11 ENCOUNTER — Inpatient Hospital Stay (HOSPITAL_COMMUNITY)

## 2024-03-11 DIAGNOSIS — I480 Paroxysmal atrial fibrillation: Secondary | ICD-10-CM | POA: Diagnosis not present

## 2024-03-11 DIAGNOSIS — K449 Diaphragmatic hernia without obstruction or gangrene: Secondary | ICD-10-CM

## 2024-03-11 DIAGNOSIS — R634 Abnormal weight loss: Secondary | ICD-10-CM | POA: Diagnosis not present

## 2024-03-11 DIAGNOSIS — D500 Iron deficiency anemia secondary to blood loss (chronic): Secondary | ICD-10-CM | POA: Diagnosis not present

## 2024-03-11 DIAGNOSIS — I495 Sick sinus syndrome: Secondary | ICD-10-CM | POA: Diagnosis not present

## 2024-03-11 HISTORY — PX: ESOPHAGOGASTRODUODENOSCOPY: SHX5428

## 2024-03-11 LAB — CBC
HCT: 30.6 % — ABNORMAL LOW (ref 36.0–46.0)
Hemoglobin: 9.5 g/dL — ABNORMAL LOW (ref 12.0–15.0)
MCH: 26.1 pg (ref 26.0–34.0)
MCHC: 31 g/dL (ref 30.0–36.0)
MCV: 84.1 fL (ref 80.0–100.0)
Platelets: 186 10*3/uL (ref 150–400)
RBC: 3.64 MIL/uL — ABNORMAL LOW (ref 3.87–5.11)
RDW: 18.6 % — ABNORMAL HIGH (ref 11.5–15.5)
WBC: 5.9 10*3/uL (ref 4.0–10.5)
nRBC: 0 % (ref 0.0–0.2)

## 2024-03-11 LAB — RHEUMATOID FACTOR: Rheumatoid fact SerPl-aCnc: <10 [IU]/mL (ref <14)

## 2024-03-11 LAB — RENAL FUNCTION PANEL
Albumin: 2.9 g/dL — ABNORMAL LOW (ref 3.5–5.0)
Anion gap: 8 (ref 5–15)
BUN: 18 mg/dL (ref 8–23)
CO2: 18 mmol/L — ABNORMAL LOW (ref 22–32)
Calcium: 10.3 mg/dL (ref 8.9–10.3)
Chloride: 111 mmol/L (ref 98–111)
Creatinine, Ser: 1.56 mg/dL — ABNORMAL HIGH (ref 0.44–1.00)
GFR, Estimated: 33 mL/min — ABNORMAL LOW (ref >60)
Glucose, Bld: 93 mg/dL (ref 70–99)
Phosphorus: 2.2 mg/dL — ABNORMAL LOW (ref 2.5–4.6)
Potassium: 4.3 mmol/L (ref 3.5–5.1)
Sodium: 137 mmol/L (ref 135–145)

## 2024-03-11 LAB — ANA: Anti Nuclear Antibody (ANA): POSITIVE — AB

## 2024-03-11 LAB — MAGNESIUM: Magnesium: 1.8 mg/dL (ref 1.7–2.4)

## 2024-03-11 LAB — ANTI-NUCLEAR AB-TITER (ANA TITER): ANA Titer 1: 1:320 {titer} — ABNORMAL HIGH

## 2024-03-11 SURGERY — EGD (ESOPHAGOGASTRODUODENOSCOPY)
Anesthesia: Monitor Anesthesia Care

## 2024-03-11 MED ORDER — PROPOFOL 500 MG/50ML IV EMUL
INTRAVENOUS | Status: DC | PRN
  Administered 2024-03-11: 100 ug/kg/min via INTRAVENOUS

## 2024-03-11 MED ORDER — GLYCOPYRROLATE PF 0.2 MG/ML IJ SOSY
PREFILLED_SYRINGE | INTRAMUSCULAR | Status: DC | PRN
  Administered 2024-03-11: .2 mg via INTRAVENOUS

## 2024-03-11 MED ORDER — HYDRALAZINE HCL 20 MG/ML IJ SOLN: 10.0000 mg | INTRAMUSCULAR | Status: DC | PRN

## 2024-03-11 MED ORDER — METOPROLOL SUCCINATE ER 25 MG PO TB24: 25.0000 mg | ORAL_TABLET | Freq: Every day | ORAL | 0 refills | Status: DC

## 2024-03-11 MED ORDER — HYDRALAZINE HCL 50 MG PO TABS: 100.0000 mg | ORAL_TABLET | Freq: Two times a day (BID) | ORAL | Status: DC

## 2024-03-11 NOTE — Progress Notes (Signed)
 OT Cancellation Note  Patient Details Name: Barbara [name] MRN: [id number] DOB: 06-13-1941   Cancelled Treatment:    Reason Eval/Treat Not Completed: Patient at procedure or test/ unavailable. Will follow up.  Erving Heather OTR/L  Acute Rehab Services  [phone] office number   Stevphen Elders 03/11/2024, 7:53 AM

## 2024-03-11 NOTE — Op Note (Signed)
 [facility] Patient Name: Barbara [name] Procedure Date : 03/11/2024 MRN: [id number] Attending MD: Ozell Blunt , MD, [id number] Date of Birth: November 16, 1940 CSN: [id number] Age: 83 Admit Type: Inpatient Procedure:                Upper GI endoscopy Indications:              Iron deficiency anemia secondary to chronic blood                            loss, Heme positive stool Providers:                Ozell Blunt, MD, Maxie Spaniel, RN, Kimberly Penna                            [place], Technician Referring MD:              Medicines:                Monitored Anesthesia Care Complications:            No immediate complications. Estimated Blood Loss:     Estimated blood loss: none. Procedure:                Pre-Anesthesia Assessment:                           - Prior to the procedure, a History and Physical                            was performed, and patient medications and                            allergies were reviewed. The patient's tolerance of                            previous anesthesia was also reviewed. The risks                            and benefits of the procedure and the sedation                            options and risks were discussed with the patient.                            All questions were answered, and informed consent                            was obtained. Prior Anticoagulants: The patient has                            taken Eliquis  (apixaban ), last dose was 2 days                            prior to procedure. ASA Grade Assessment: III - A  patient with severe systemic disease. After                            reviewing the risks and benefits, the patient was                            deemed in satisfactory condition to undergo the                            procedure.                           After obtaining informed consent, the endoscope was                            passed under direct vision. Throughout  the                            procedure, the patient's blood pressure, pulse, and                            oxygen saturations were monitored continuously. The                            [id number]-[id number] ([id number] Olympus endoscope was introduced                            through the mouth, and advanced to the third part                            of duodenum. The upper GI endoscopy was                            accomplished without difficulty. The patient                            tolerated the procedure well. Scope In: Scope Out: Findings:      The larynx was normal.      A Very tiny hiatal hernia was present.      The entire examined stomach was normal.      The duodenal bulb, first portion of the duodenum, second portion of the       duodenum and third portion of the duodenum were normal.      The cardia and gastric fundus were normal on retroflexion. Impression:               - Normal larynx.                           - Very tiny hiatal hernia.                           - Normal stomach.                           - Normal duodenal bulb, first portion of the  duodenum, second portion of the duodenum and third                            portion of the duodenum.                           - No specimens collected. Recommendation:           - Soft diet today.                           - Continue present medications. Reevaluate blood                            thinner needs but hopefully will not require both                            aspirin  and Eliquis                            - Return to GI clinic in 6 weeks. To recheck                            symptoms guaiacs and see if further workup and                            plans like colonoscopy is needed                           - Telephone GI clinic if symptomatic PRN. And                            please call me if I can be of any further                            assistance with this hospital  stay Procedure Code(s):        --- Professional ---                           [phone], Esophagogastroduodenoscopy, flexible,                            transoral; diagnostic, including collection of                            specimen(s) by brushing or washing, when performed                            (separate procedure) Diagnosis Code(s):        --- Professional ---                           K44.9, Diaphragmatic hernia without obstruction or                            gangrene  D50.0, Iron deficiency anemia secondary to blood                            loss (chronic)                           R19.5, Other fecal abnormalities CPT copyright 2022 [employer]. All rights reserved. The codes documented in this report are preliminary and upon coder review may  be revised to meet current compliance requirements. Ozell Blunt, MD 03/11/2024 8:57:42 AM This report has been signed electronically. Number of Addenda: 0

## 2024-03-11 NOTE — Evaluation (Signed)
 Occupational Therapy Evaluation Patient Details Name: Barbara [name] MRN: [id number] DOB: 09/19/1941 Today's Date: 03/11/2024   History of Present Illness   Pt is a 83 yr old female who presented 03/09/24 due to abnormal labs for Hgb. Pt was transfused 1 unit but pt noted to show iron deficiency.  5/8 upper endoscopy. PMH: CAD, diastolic CHF, A-fib on Eliquis , essential hypertension, hyperlipidemia, anxiety disorder, CKD stage IIIa     Clinical Impressions Pt reports they live alone and has 2 steps to enter the home. Pt has quad cane, RW and upright walker at the home. Pt required set up for UE/LE dressing with increase in time. She was able to complete sit to stand transfer off lower surface with CGA to supervision At this time recommendation for [facility] services and pt to use friends with transportation at this time and continue to have friends to assist with dropping off meals/medications.      If plan is discharge home, recommend the following:   A little help with walking and/or transfers;A little help with bathing/dressing/bathroom;Assistance with cooking/housework     Functional Status Assessment   Patient has had a recent decline in their functional status and demonstrates the ability to make significant improvements in function in a reasonable and predictable amount of time.     Equipment Recommendations   None recommended by OT     Recommendations for Other Services         Precautions/Restrictions   Precautions Precautions: Fall Restrictions Weight Bearing Restrictions Per Provider Order: No     Mobility Bed Mobility Overal bed mobility: Modified Independent                  Transfers Overall transfer level: Needs assistance Equipment used: Rolling walker (2 wheels) Transfers: Sit to/from Stand Sit to Stand: Contact guard assist           General transfer comment: needs increase in time to complete      Balance Overall balance  assessment: Mild deficits observed, not formally tested                                         ADL either performed or assessed with clinical judgement   ADL Overall ADL's : Needs assistance/impaired Eating/Feeding: Independent;Sitting   Grooming: Wash/dry face;Set up;Sitting   Upper Body Bathing: Set up;Sitting   Lower Body Bathing: Set up;Sitting/lateral leans   Upper Body Dressing : Set up;Sitting   Lower Body Dressing: Set up;Sitting/lateral leans;Sit to/from stand   Toilet Transfer: Set up;Cueing for safety;Cueing for sequencing   Toileting- Clothing Manipulation and Hygiene: Supervision/safety;Sit to/from stand;Sitting/lateral lean       Functional mobility during ADLs: Contact guard assist;Rolling walker (2 wheels)       Vision Baseline Vision/History: 1 Wears glasses Ability to See in Adequate Light: 0 Adequate Patient Visual Report: No change from baseline Vision Assessment?: No apparent visual deficits     Perception Perception: Within Functional Limits       Praxis Praxis: WFL       Pertinent Vitals/Pain Pain Assessment Pain Assessment: No/denies pain     Extremity/Trunk Assessment Upper Extremity Assessment Upper Extremity Assessment: Generalized weakness   Lower Extremity Assessment Lower Extremity Assessment: Defer to PT evaluation   Cervical / Trunk Assessment Cervical / Trunk Assessment: Normal   Communication Communication Communication: No apparent difficulties   Cognition Arousal: Alert Behavior During  Therapy: WFL for tasks assessed/performed Cognition: No apparent impairments                               Following commands: Intact       Cueing  General Comments   Cueing Techniques: Verbal cues      Exercises     Shoulder Instructions      Home Living Family/patient expects to be discharged to:: Private residence Living Arrangements: Alone Available Help at Discharge:  Family;Available PRN/intermittently Type of Home: House Home Access: Stairs to enter [employer] of Steps: 2 Entrance Stairs-Rails: Right Home Layout: One level     Bathroom Shower/Tub: Producer, television/film/video: Handicapped height Bathroom Accessibility: Yes   Home Equipment: Rolling Walker (2 wheels);Grab bars - toilet;Grab bars - tub/shower;Hand held shower head;Shower seat;Adaptive equipment;Rollator (4 wheels);Cane - quad (upright walker) Adaptive Equipment: Reacher;Other (Comment) (hand held brush) Additional Comments: has church friends to assist if needed      Prior Functioning/Environment Prior Level of Function : Independent/Modified Independent;Working/employed             Mobility Comments: RW in home, small quad cane in community. Upright RW for church ADLs Comments: sits to shower    OT Problem List: Decreased strength;Decreased activity tolerance;Impaired balance (sitting and/or standing)   OT Treatment/Interventions: Self-care/ADL training;Therapeutic exercise;Patient/family education;Balance training      OT Goals(Current goals can be found in the care plan section)   Acute Rehab OT Goals Patient Stated Goal: to go home OT Goal Formulation: With patient Time For Goal Achievement: 03/25/24 Potential to Achieve Goals: Good   OT Frequency:  Min 2X/week    Co-evaluation PT/OT/SLP Co-Evaluation/Treatment: Yes Reason for Co-Treatment: For patient/therapist safety;To address functional/ADL transfers PT goals addressed during session: Mobility/safety with mobility;Balance;Proper use of DME        AM-PAC OT "6 Clicks" Daily Activity     Outcome Measure Help from another person eating meals?: None Help from another person taking care of personal grooming?: None Help from another person toileting, which includes using toliet, bedpan, or urinal?: A Little Help from another person bathing (including washing, rinsing, drying)?: A  Little Help from another person to put on and taking off regular upper body clothing?: None Help from another person to put on and taking off regular lower body clothing?: None 6 Click Score: 22   End of Session Equipment Utilized During Treatment: Gait belt;Rolling walker (2 wheels) Nurse Communication: Mobility status  Activity Tolerance: Patient tolerated treatment well Patient left: in bed;with call bell/phone within reach;with bed alarm set  OT Visit Diagnosis: Unsteadiness on feet (R26.81);Other abnormalities of gait and mobility (R26.89);Repeated falls (R29.6);Muscle weakness (generalized) (M62.81)                Time: 3295-1884 OT Time Calculation (min): 35 min Charges:  OT General Charges $OT Visit: 1 Visit OT Evaluation $OT Eval Low Complexity: 1 Low  Erving Heather OTR/L  Acute Rehab Services  [phone] office number   Stevphen Elders 03/11/2024, 12:39 PM

## 2024-03-11 NOTE — Transfer of Care (Signed)
 Immediate Anesthesia Transfer of Care Note  Patient: Barbara [name]  Procedure(s) Performed: EGD (ESOPHAGOGASTRODUODENOSCOPY)  Patient Location: Endoscopy Unit  Anesthesia Type:MAC  Level of Consciousness: drowsy  Airway & Oxygen Therapy: Patient Spontanous Breathing  Post-op Assessment: Report given to RN and Post -op Vital signs reviewed and stable  Post vital signs: Reviewed and stable  Last Vitals:  Vitals Value Taken Time  BP 149/62 03/11/24 0853  Temp    Pulse 68 03/11/24 0854  Resp 28 03/11/24 0854  SpO2 93 % 03/11/24 0854  Vitals shown include unfiled device data.  Last Pain:  Vitals:   03/11/24 0804  TempSrc: Temporal  PainSc: 0-No pain         Complications: No notable events documented.

## 2024-03-11 NOTE — Discharge Summary (Signed)
 Physician Discharge Summary  Barbara [name] ZOX:[id number] DOB: [birth year] DOA: 03/09/2024  PCP: Donley Furth, MD  Admit date: 03/09/2024 Discharge date: 03/11/24  Admitted From: Home Disposition: Home Recommendations for Outpatient Follow-up:  Follow up with PCP and cardiology in 1 to 2 weeks Check blood pressure, heart rate, CMP and CBC at follow-up Please follow up on the following pending results: None  Home Health: [facility] PT/OT Equipment/Devices: Patient has appropriate DME's  Discharge Condition: Stable CODE STATUS: Full code  Follow-up Information     Donley Furth, MD. Schedule an appointment as soon as possible for a visit in 1 week(s).   Specialty: Family Medicine Contact information: [address] [place] [address] [phone]         [facility] up.   Why: [facility] will provide home health services.  they will call you in the next 24-48 hours and set up services. Contact information: [address] [place] [address] [phone]                 Hospital course 83 year old F with PMH of HFpEF, CAD, CKD-3B, SSS/PPM, A-fib on Eliquis , HTN, HLD and anxiety sent to ED by PCP due to anemia/low hemoglobin.  Seen by PCP on 5/5 for chronic fatigue, stiff joints, muscle aches and weight loss.  Reportedly lost about 40 pounds over the past 1 year.  Patient denies melena but notes some fresh blood when she wipes that she attributes to constipation and hemorrhoid.  She is on Eliquis  for A-fib and also takes baby aspirin .  Hemoglobin was 8.0 at PCP office (baseline 10-11).    In ED, slightly hypotensive to 118/54 but improved quickly.  Hgb 7.6.  WBC 3.5.  Hemoccult positive. Cr 1.63.  Calcium  10.7.  Albumin 3.2.  One unit of blood ordered.  GI consulted, and admitted.   Hgb improved to 9.3 after 1 unit and remained stable afterward..  Anemia panel with significant iron deficiency.   Received IV iron.  EGD without significant finding.  Eliquis  resumed.  Low-dose aspirin  discontinued.  She is already on PPI.  Recommend repeat CBC in 1 week.  Of note, patient with borderline HR. decreased home Toprol -XL from 100 mg daily to 25 mg daily.  Outpatient follow-up with PCP and cardiology in 1 to 2 weeks.  See individual problem list below for more.   Problems addressed during this hospitalization Symptomatic anemia likely due to slow GI bleed and CKD.  No report of melena or hematochezia other than small blood when she wipes.  Patient is on Eliquis  and low-dose aspirin .  Transfused 1 unit with appropriate response.  H&H remained stable.  Anemia panel with iron deficiency.  Received IV Venofer 300 mg x 1.  EGD without significant finding.  Discontinue low-dose aspirin .  Load dose Eliquis  resumed. -Recheck CBC at follow-up   CKD-3B: Stable Recent Labs    09/26/23 1248 09/27/23 0519 03/08/24 1519 03/09/24 1630 03/10/24 0605 03/11/24 0743  BUN 17 22 23 23 20 18   CREATININE 1.53* 1.58* 1.84* 1.63* 1.70* 1.56*  - Recheck at follow-up    Chronic HFpEF: TTE in 2023 with LVEF > 75%, indeterminate DD and normal RV SF.  Appears euvolemic on exam.  No cardiopulmonary symptoms.  Not on diuretics at home -Continue home meds   History of CAD: Stable.  No cardiopulmonary symptoms - Resume home Imdur , Crestor  and Eliquis  -Decrease Toprol -XL from 100 mg daily to 25 mg daily due  to borderline HR - Discontinue low-dose aspirin  due to risk of bleeding with Eliquis    Essential hypertension: BP elevated this morning as a result of dosage reduction on hydralazine  and home Toprol -XL -Increase hydralazine  to home dose -Continue home Inderal -Continue Toprol -XL at 25 mg daily (was on 100 mg daily at home).  Has borderline HR with fatigue   Paroxysmal A-fib: Rate controlled -Toprol -XL as above - Continue home Eliquis    SSS/PPM   Hyperlipidemia - Continue statin   Hypercalcemia: Mild.   Resolved.   Hypokalemia: Resolved. - Monitor replenish as appropriate   Generalized weakness/fatigue: Probably due to anemia.  Thyroid  panel normal. - Made changes to home antihypertensive meds. - Treated anemia as above - HH PT/OT            Time spent 35 minutes  Vital signs Vitals:   03/11/24 0804 03/11/24 0855 03/11/24 0900 03/11/24 0910  BP: (!) 161/58 (!) 149/62 (!) 147/77 (!) 159/56  Pulse:  68 62 61  Temp: 97.8 F (36.6 C) (!) 97.5 F (36.4 C)    Resp: (!) 21 (!) 28 (!) 27 (!) 22  Height:      Weight:      SpO2: 94% 93% 93% 93%  TempSrc: Temporal Temporal    BMI (Calculated):         Discharge exam  GENERAL: No apparent distress.  Nontoxic. HEENT: MMM.  Vision and hearing grossly intact.  NECK: Supple.  No apparent JVD.  RESP:  No IWOB.  Fair aeration bilaterally. CVS:  RRR. Heart sounds normal.  ABD/GI/GU: BS+. Abd soft, NTND.  MSK/EXT:  Moves extremities. No apparent deformity. No edema.  SKIN: no apparent skin lesion or wound NEURO: Awake and alert. Oriented appropriately.  No apparent focal neuro deficit. PSYCH: Calm. Normal affect.  Discharge Instructions Discharge Instructions     Diet - low sodium heart healthy   Complete by: As directed    Discharge instructions   Complete by: As directed    It has been a pleasure taking care of you!  You were hospitalized due to anemia (low blood level) for which you have been evaluated endoscopically and treated with transfusion and IV iron.  Your endoscopy did not show source of bleeding.  We have stopped your aspirin  to decrease your risk of bleeding.  Can continue taking Eliquis .  We have also decreased your metoprolol  due to your slow heart rate can contribute to fatigue and tiredness.  This reviewed your new medication list and the directions on your medications before you take them.  Follow up with your primary care doctor and cardiologist in 1 to 2 weeks.    Take care,   Increase activity slowly    Complete by: As directed       Allergies as of 03/11/2024       Reactions   Lidocaine  Anaphylaxis   Morphine  Other (See Comments)   "Body shuts down"   Procaine Hcl Anaphylaxis, Rash, Other (See Comments)   "Anything with 'caine' in it "   Sulfonamide Derivatives Hives   Amlodipine  Swelling   Norco [hydrocodone -acetaminophen ] Nausea And Vomiting, Other (See Comments)   States does ok with IV form    Tramadol  Nausea And Vomiting   Vytorin [ezetimibe-simvastatin] Other (See Comments)   Joint pain   Tape Itching, Other (See Comments)   Patient prefers either paper tape or Coban wrap        Medication List     STOP taking these medications  Aspirin  Low Dose 81 MG chewable tablet Generic drug: aspirin    dextromethorphan  30 MG/5ML liquid Commonly known as: DELSYM    ZZZQUIL PO       TAKE these medications    acetaminophen  500 MG tablet Commonly known as: TYLENOL  Take 500 mg by mouth daily as needed for headache.   alum & mag hydroxide-simeth 200-200-20 MG/5ML suspension Commonly known as: MAALOX/MYLANTA Take 30 mLs by mouth every 4 (four) hours as needed for indigestion.   apixaban  2.5 MG Tabs tablet Commonly known as: Eliquis  Take 1 tablet (2.5 mg total) by mouth 2 (two) times daily.   empagliflozin  10 MG Tabs tablet Commonly known as: Jardiance  Take 1 tablet (10 mg total) by mouth daily.   hydrALAZINE  100 MG tablet Commonly known as: APRESOLINE  Take 1.5 tablets (150 mg total) by mouth 2 (two) times daily.   ipratropium 0.06 % nasal spray Commonly known as: ATROVENT  Place 2 sprays into both nostrils 2 (two) times daily as needed (allergies).   isosorbide  mononitrate 30 MG 24 hr tablet Commonly known as: IMDUR  Take 1 tablet (30 mg total) by mouth daily.   ketoconazole  2 % cream Commonly known as: NIZORAL  Apply 1 Application topically 2 (two) times daily as needed for irritation.   loratadine  10 MG tablet Commonly known as: CLARITIN  Take 10 mg by  mouth daily as needed for allergies.   LORazepam  0.5 MG tablet Commonly known as: ATIVAN  TAKE 1 TABLET BY MOUTH EVERY 6 HOURS AS NEEDED FOR ANXIETY   metoprolol  succinate 25 MG 24 hr tablet Commonly known as: TOPROL -XL Take 1 tablet (25 mg total) by mouth daily. Take with or immediately following a meal. What changed:  medication strength how much to take when to take this   nitroGLYCERIN  0.4 MG SL tablet Commonly known as: NITROSTAT  DISSOLVE ONE TABLET UNDER THE TONGUE EVERY 5 MINUTES AS NEEDED FOR CHEST PAIN.  DO NOT EXCEED A TOTAL OF 3 DOSES IN 15 MINUTES What changed: See the new instructions.   pantoprazole  40 MG tablet Commonly known as: PROTONIX  Take 1 tablet (40 mg total) by mouth daily.   PROBIOTIC GUMMIES PO Take 5 each by mouth every morning. Align Probiotic   rosuvastatin  40 MG tablet Commonly known as: CRESTOR  Take 1 tablet by mouth once daily What changed: when to take this   triamcinolone  55 MCG/ACT nasal inhaler Commonly known as: NASACORT Place 1 spray into both nostrils daily as needed (for allergies).   Vitamin D3 50 MCG (2000 UT) Tabs Take 2,000 Units by mouth in the morning.        Consultations: Gastroenterology  Procedures/Studies: EGD on 5/8 without significant finding   CT ABDOMEN PELVIS WO CONTRAST Result Date: 03/09/2024 CLINICAL DATA:  Weight loss EXAM: CT ABDOMEN AND PELVIS WITHOUT CONTRAST TECHNIQUE: Multidetector CT imaging of the abdomen and pelvis was performed following the standard protocol without IV contrast. RADIATION DOSE REDUCTION: This exam was performed according to the departmental dose-optimization program which includes automated exposure control, adjustment of the mA and/or kV according to patient size and/or use of iterative reconstruction technique. COMPARISON:  CT abdomen and pelvis 06/09/2021. FINDINGS: Lower chest: There are atelectatic changes in the lung bases. 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 Hepatobiliary:  No focal liver abnormality is seen. No gallstones, gallbladder wall thickening, or biliary dilatation. Pancreas: Unremarkable. No pancreatic ductal dilatation or surrounding inflammatory changes. Spleen: Normal in size without focal abnormality. Adrenals/Urinary Tract: Right renal cysts are present measuring up to 3.5 cm. Left renal cysts are present  measuring up to 2.3 cm. No urinary tract calculi. No hydronephrosis. Adrenal glands and bladder are within normal limits. There is limited evaluation of bladder secondary to streak artifact in the pelvis. Stomach/Bowel: Stomach is within normal limits. Appendix appears normal. No evidence of bowel wall thickening, distention, or inflammatory changes. There are scattered colonic diverticula. Vascular/Lymphatic: Aortic atherosclerosis. No enlarged abdominal or pelvic lymph nodes. Reproductive: Status post hysterectomy. No adnexal masses. Other: No abdominal wall hernia or abnormality. No abdominopelvic ascites. Musculoskeletal: Degenerative changes affect the spine. Left hip arthroplasty is present. IMPRESSION: 1. No acute localizing process in the abdomen or pelvis. 2. Bilateral renal cysts. No follow-up imaging recommended. 3. Colonic diverticulosis without evidence for diverticulitis. 4. Aortic atherosclerosis. Aortic Atherosclerosis (ICD10-I70.0). Electronically Signed   By: Tyron Gallon M.D.   On: 03/09/2024 19:58   CUP PACEART INCLINIC DEVICE CHECK Result Date: 02/10/2024 Normal in-clinic dual chamber pacemaker check. Presenting Rhythm: APVS. Routine testing of thresholds, sensing, and impedance demonstrate stable parameters. ADL rate adjusted from 95 to 105 with industry support. Estimated longevity 11.5 years. Pt enrolled in remote follow-up. bo      The results of significant diagnostics from this hospitalization (including imaging, microbiology, ancillary and laboratory) are listed below for reference.     Microbiology: No results found for this or  any previous visit (from the past 240 hours).   Labs:  CBC: Recent Labs  Lab 03/08/24 1519 03/09/24 1630 03/10/24 0605 03/11/24 0743  WBC 2.8* 3.5* 5.1 5.9  NEUTROABS 2.1 2.3  --   --   HGB 8.0 Repeated and verified X2.* 7.6* 9.3* 9.5*  HCT 25.8* 25.6* 29.0* 30.6*  MCV 79.0 83.9 82.4 84.1  PLT 183.0 196 195 186   BMP &GFR Recent Labs  Lab 03/08/24 1519 03/09/24 1630 03/10/24 0602 03/10/24 0605 03/11/24 0743  NA 139 139  --  139 137  K 3.6 3.8  --  3.3* 4.3  CL 106 105  --  107 111  CO2 26 23  --  23 18*  GLUCOSE 101* 100*  --  93 93  BUN 23 23  --  20 18  CREATININE 1.84* 1.63*  --  1.70* 1.56*  CALCIUM  10.4 10.7*  --  10.8* 10.3  MG  --   --  1.8  --  1.8  PHOS  --   --   --   --  2.2*   Estimated Creatinine Clearance: 25.7 mL/min (A) (by C-G formula based on SCr of 1.56 mg/dL (H)). Liver & Pancreas: Recent Labs  Lab 03/08/24 1519 03/09/24 1630 03/10/24 0605 03/11/24 0743  AST 23 25 24   --   ALT 10 11 12   --   ALKPHOS 67 62 66  --   BILITOT 0.7 0.7 1.5*  --   PROT 7.4 7.3 7.2  --   ALBUMIN 3.6 3.2* 3.1* 2.9*   No results for input(s): "LIPASE", "AMYLASE" in the last 168 hours. No results for input(s): "AMMONIA" in the last 168 hours. Diabetic: No results for input(s): "HGBA1C" in the last 72 hours. No results for input(s): "GLUCAP" in the last 168 hours. Cardiac Enzymes: Recent Labs  Lab 03/08/24 1519  CKTOTAL 55   No results for input(s): "PROBNP" in the last 8760 hours. Coagulation Profile: Recent Labs  Lab 03/09/24 1630  INR 1.3*   Thyroid  Function Tests: Recent Labs    03/09/24 1827  TSH 2.515  FREET4 1.36*   Lipid Profile: No results for input(s): "CHOL", "HDL", "LDLCALC", "TRIG", "CHOLHDL", "  LDLDIRECT" in the last 72 hours. Anemia Panel: Recent Labs    03/09/24 1829  VITAMINB12 302  FOLATE 19.5  FERRITIN 44  TIBC 423  IRON 29  RETICCTPCT 2.4   Urine analysis:    Component Value Date/Time   COLORURINE YELLOW 06/09/2022  1426   APPEARANCEUR CLOUDY (A) 06/09/2022 1426   LABSPEC 1.010 06/09/2022 1426   PHURINE 6.5 06/09/2022 1426   GLUCOSEU NEGATIVE 06/09/2022 1426   GLUCOSEU NEGATIVE 09/13/2021 1203   HGBUR LARGE (A) 06/09/2022 1426   HGBUR negative 10/17/2010 0859   BILIRUBINUR NEGATIVE 06/09/2022 1426   BILIRUBINUR neg 08/01/2021 1037   KETONESUR NEGATIVE 06/09/2022 1426   PROTEINUR NEGATIVE 06/09/2022 1426   UROBILINOGEN 0.2 09/13/2021 1203   NITRITE NEGATIVE 06/09/2022 1426   LEUKOCYTESUR TRACE (A) 06/09/2022 1426   Sepsis Labs: Invalid input(s): "PROCALCITONIN", "LACTICIDVEN"   SIGNED:  Theadore Finger, MD  [facility] 03/11/2024, 3:55 PM

## 2024-03-11 NOTE — Progress Notes (Signed)
 Barbara [name] 8:38 AM  Subjective: Patient with some shortness of breath some midepigastric discomfort and some decreased appetite none of which she complained about yesterday but no other complaints and we rediscussed the procedure  Objective: Signs stable afebrile no acute distress exam please see preassessment evaluation hemoglobin stable  Assessment: Guaiac positive anemia patient on aspirin  and Eliquis   Plan: Get a proceed with endoscopy with anesthesia assistance  [facility]  office [phone] After 5PM or if no answer call [phone]

## 2024-03-11 NOTE — Evaluation (Signed)
 Physical Therapy Evaluation Patient Details Name: Barbara [name] MRN: [id number] DOB: 06-26-1941 Today's Date: 03/11/2024  History of Present Illness  Pt is a 83 yr old female who presented 03/09/24 due to abnormal labs for Hgb. Pt was transfused 1 unit but pt noted to show iron deficiency.  5/8 upper endoscopy. PMH: CAD, diastolic CHF, A-fib on Eliquis , essential hypertension, hyperlipidemia, anxiety disorder, CKD stage IIIa   Clinical Impression  Pt in bed upon arrival and agreeable to PT eval. PTA, pt was ModI with RW in the home and small quad quad in the community. Pt required CGA to stand with RW and supervision to ambulate 284ft. She was able to ascend/descend 2 steps with 1HR and quad cane. Discussed having family close by when performing stair negotiation for safety. Pt will have intermittent assist available upon d/c home. Recommending HHPT to work on mobility and improving activity tolerance. Pt currently with functional limitations due to the deficits listed below (see PT Problem List). Pt would benefit from acute skilled PT to address functional impairments. Acute PT to follow.         If plan is discharge home, recommend the following: A little help with walking and/or transfers;A little help with bathing/dressing/bathroom;Assist for transportation;Help with stairs or ramp for entrance   Can travel by private vehicle    Yes    Equipment Recommendations None recommended by PT     Functional Status Assessment Patient has had a recent decline in their functional status and demonstrates the ability to make significant improvements in function in a reasonable and predictable amount of time.     Precautions / Restrictions Precautions Precautions: Fall Restrictions Weight Bearing Restrictions Per Provider Order: No      Mobility  Bed Mobility Overal bed mobility: Modified Independent   Transfers Overall transfer level: Needs assistance Equipment used: Rolling walker  (2 wheels) Transfers: Sit to/from Stand Sit to Stand: Contact guard assist    General transfer comment: CGA for safety    Ambulation/Gait Ambulation/Gait assistance: Supervision Gait Distance (Feet): 200 Feet Assistive device: Rolling walker (2 wheels) Gait Pattern/deviations: Step-through pattern, Decreased stride length Gait velocity: decr     General Gait Details: slow and steady gait with no overt LOB  Stairs Stairs: Yes Stairs assistance: Contact guard assist Stair Management: One rail Right, Forwards, With cane Number of Stairs: 2 General stair comments: use of 1HR and small quad cane, CGA for safety     Balance Overall balance assessment: Mild deficits observed, not formally tested          Pertinent Vitals/Pain Pain Assessment Pain Assessment: No/denies pain    Home Living Family/patient expects to be discharged to:: Private residence Living Arrangements: Alone Available Help at Discharge: Family;Available PRN/intermittently Type of Home: House Home Access: Stairs to enter Entrance Stairs-Rails: Right Entrance Stairs-Number of Steps: 2   Home Layout: One level Home Equipment: Rolling Walker (2 wheels);Grab bars - toilet;Grab bars - tub/shower;Hand held shower head;Shower seat;Adaptive equipment;Rollator (4 wheels);Cane - quad (upright RW)      Prior Function Prior Level of Function : Independent/Modified Independent;Working/employed      Mobility Comments: RW in home, small quad cane in community. Upright RW for church ADLs Comments: sits to shower     Extremity/Trunk Assessment   Upper Extremity Assessment Upper Extremity Assessment: Defer to OT evaluation    Lower Extremity Assessment Lower Extremity Assessment: Generalized weakness    Cervical / Trunk Assessment Cervical / Trunk Assessment: Normal  Communication   Communication  Communication: No apparent difficulties    Cognition Arousal: Alert Behavior During Therapy: WFL for tasks  assessed/performed   PT - Cognitive impairments: No apparent impairments        Following commands: Intact       Cueing Cueing Techniques: Verbal cues         PT Assessment Patient needs continued PT services  PT Problem List Decreased strength;Decreased activity tolerance;Decreased balance;Decreased mobility       PT Treatment Interventions DME instruction;Gait training;Stair training;Functional mobility training;Therapeutic activities;Therapeutic exercise;Balance training;Neuromuscular re-education;Patient/family education    PT Goals (Current goals can be found in the Care Plan section)  Acute Rehab PT Goals Patient Stated Goal: to get better PT Goal Formulation: With patient Time For Goal Achievement: 03/25/24 Potential to Achieve Goals: Good    Frequency Min 2X/week     Co-evaluation PT/OT/SLP Co-Evaluation/Treatment: Yes Reason for Co-Treatment: For patient/therapist safety;To address functional/ADL transfers PT goals addressed during session: Mobility/safety with mobility;Balance;Proper use of DME         AM-PAC PT "6 Clicks" Mobility  Outcome Measure Help needed turning from your back to your side while in a flat bed without using bedrails?: None Help needed moving from lying on your back to sitting on the side of a flat bed without using bedrails?: None Help needed moving to and from a bed to a chair (including a wheelchair)?: A Little Help needed standing up from a chair using your arms (e.g., wheelchair or bedside chair)?: A Little Help needed to walk in hospital room?: A Little Help needed climbing 3-5 steps with a railing? : A Little 6 Click Score: 20    End of Session Equipment Utilized During Treatment: Gait belt Activity Tolerance: Patient tolerated treatment well Patient left: in bed;with call bell/phone within reach Nurse Communication: Mobility status PT Visit Diagnosis: Unsteadiness on feet (R26.81);Other abnormalities of gait and mobility  (R26.89);Muscle weakness (generalized) (M62.81)    Time: 5284-1324 PT Time Calculation (min) (ACUTE ONLY): 35 min   Charges:   PT Evaluation $PT Eval Low Complexity: 1 Low   PT General Charges $$ ACUTE PT VISIT: 1 Visit        Orysia Blas, PT, DPT Secure Chat Preferred  Rehab Office [phone]   [name] 03/11/2024, 11:59 AM

## 2024-03-11 NOTE — Anesthesia Postprocedure Evaluation (Signed)
 Anesthesia Post Note  Patient: Barbara [name]  Procedure(s) Performed: EGD (ESOPHAGOGASTRODUODENOSCOPY)     Patient location during evaluation: PACU Anesthesia Type: MAC Level of consciousness: awake and alert Pain management: pain level controlled Vital Signs Assessment: post-procedure vital signs reviewed and stable Respiratory status: spontaneous breathing, nonlabored ventilation, respiratory function stable and patient connected to nasal cannula oxygen Cardiovascular status: stable and blood pressure returned to baseline Postop Assessment: no apparent nausea or vomiting Anesthetic complications: no   No notable events documented.  Last Vitals:  Vitals:   03/11/24 0900 03/11/24 0910  BP: (!) 147/77 (!) 159/56  Pulse: 62 61  Resp: (!) 27 (!) 22  Temp:    SpO2: 93% 93%    Last Pain:  Vitals:   03/11/24 0910  TempSrc:   PainSc: (P) 0-No pain                 Lethaniel Rave

## 2024-03-11 NOTE — Progress Notes (Signed)
 PT Cancellation Note  Patient Details Name: Barbara [name] MRN: [id number] DOB: 11-01-1941   Cancelled Treatment:    Reason Eval/Treat Not Completed: Patient at procedure or test/unavailable. Acute PT to re-attempt as able.  Orysia Blas, PT, DPT Secure Chat Preferred  Rehab Office [phone]   [name] 03/11/2024, 8:07 AM

## 2024-03-11 NOTE — Progress Notes (Signed)
 Transition of Care [facility]) - Inpatient Brief Assessment   Patient Details  Name: Barbara [name] MRN: [id number] Date of Birth: May 17, 1941  Transition of Care [facility]) CM/SW Contact:    Dane Dung, RN Phone Number: 03/11/2024, 1:08 PM   Clinical Narrative: CM met with the patient at the bedside to discuss TOC needs.  The patient was offered Medicare choice regarding home health services and patient did not have a preference.  Patient states that she has had [facility] home [facility] recently and was pleased with their services.  HH order placed to be co-signed by MD. Egbert Grass, RNCM with [facility] accepted for home health services - followup placed in the AVS and patient is aware.   Transition of Care Asessment: Insurance and Status: (P) Insurance coverage has been reviewed   Home environment has been reviewed: (P) from home alone Prior level of function:: (P) RW, Hotel manager Home Services: (P) No current home services Social Drivers of Health Review: (P) SDOH reviewed interventions complete Readmission risk has been reviewed: (P) Yes Transition of care needs: (P) transition of care needs identified, TOC will continue to follow

## 2024-03-11 NOTE — Plan of Care (Signed)
 Pt has been NPO since midnight for EGD. NS @ 60 ml/hr infusing as ordered.  Will continue to monitor.   Problem: Education: Goal: Knowledge of General Education information will improve Description: Including pain rating scale, medication(s)/side effects and non-pharmacologic comfort measures Outcome: Progressing   Problem: Health Behavior/Discharge Planning: Goal: Ability to manage health-related needs will improve Outcome: Progressing   Problem: Clinical Measurements: Goal: Ability to maintain clinical measurements within normal limits will improve Outcome: Progressing Goal: Will remain free from infection Outcome: Progressing Goal: Diagnostic test results will improve Outcome: Progressing Goal: Respiratory complications will improve Outcome: Progressing Goal: Cardiovascular complication will be avoided Outcome: Progressing   Problem: Activity: Goal: Risk for activity intolerance will decrease Outcome: Progressing   Problem: Nutrition: Goal: Adequate nutrition will be maintained Outcome: Progressing   Problem: Coping: Goal: Level of anxiety will decrease Outcome: Progressing   Problem: Elimination: Goal: Will not experience complications related to bowel motility Outcome: Progressing Goal: Will not experience complications related to urinary retention Outcome: Progressing   Problem: Pain Managment: Goal: General experience of comfort will improve and/or be controlled Outcome: Progressing   Problem: Safety: Goal: Ability to remain free from injury will improve Outcome: Progressing   Problem: Skin Integrity: Goal: Risk for impaired skin integrity will decrease Outcome: Progressing

## 2024-03-11 NOTE — Anesthesia Preprocedure Evaluation (Addendum)
 Anesthesia Evaluation  Patient identified by MRN, date of birth, ID band Patient awake    Reviewed: Allergy & Precautions, H&P , NPO status , Patient's Chart, lab work & pertinent test results  History of Anesthesia Complications (+) PONV and history of anesthetic complications  Airway Mallampati: II  TM Distance: >3 FB Neck ROM: Full    Dental no notable dental hx.    Pulmonary sleep apnea , former smoker   Pulmonary exam normal breath sounds clear to auscultation       Cardiovascular hypertension, + CAD, + Past MI, + Cardiac Stents and +CHF  Normal cardiovascular exam+ dysrhythmias + pacemaker  Rhythm:Regular Rate:Normal  7/20203 1. Left ventricular ejection fraction, by estimation, is >75%. The left  ventricle has hyperdynamic function. The left ventricle has no regional  wall motion abnormalities. Left ventricular diastolic parameters are  indeterminate. Elevated left atrial  pressure.   2. Right ventricular systolic function is normal. The right ventricular  size is normal. There is mildly elevated pulmonary artery systolic  pressure.   3. Left atrial size was moderately dilated.   4. The mitral valve is normal in structure. Moderate mitral valve  regurgitation. No evidence of mitral stenosis.   5. The aortic valve is tricuspid. Aortic valve regurgitation is not  visualized. Aortic valve sclerosis is present, with no evidence of aortic  valve stenosis.   6. The inferior vena cava is normal in size with greater than 50%  respiratory variability, suggesting right atrial pressure of 3 mmHg.     Neuro/Psych  negative psych ROS   GI/Hepatic Neg liver ROS,GERD  ,,Anemia guaiac positivity   Endo/Other  negative endocrine ROS    Renal/GU Renal disease  negative genitourinary   Musculoskeletal  (+) Arthritis ,    Abdominal   Peds negative pediatric ROS (+)  Hematology  (+) Blood dyscrasia, anemia   Anesthesia  Other Findings   Reproductive/Obstetrics negative OB ROS                             Anesthesia Physical Anesthesia Plan  ASA: 3  Anesthesia Plan: MAC   Post-op Pain Management:    Induction: Intravenous  PONV Risk Score and Plan: 3 and Propofol  infusion and Treatment may vary due to age or medical condition  Airway Management Planned: Natural Airway  Additional Equipment:   Intra-op Plan:   Post-operative Plan:   Informed Consent: I have reviewed the patients History and Physical, chart, labs and discussed the procedure including the risks, benefits and alternatives for the proposed anesthesia with the patient or authorized representative who has indicated his/her understanding and acceptance.     Dental advisory given  Plan Discussed with: CRNA  Anesthesia Plan Comments:         Anesthesia Quick Evaluation

## 2024-03-14 ENCOUNTER — Encounter (HOSPITAL_COMMUNITY): Payer: Self-pay | Admitting: Gastroenterology

## 2024-03-15 ENCOUNTER — Encounter (HOSPITAL_COMMUNITY): Payer: Self-pay

## 2024-03-16 DIAGNOSIS — G4733 Obstructive sleep apnea (adult) (pediatric): Secondary | ICD-10-CM | POA: Diagnosis not present

## 2024-03-17 ENCOUNTER — Ambulatory Visit: Payer: Self-pay

## 2024-03-17 ENCOUNTER — Ambulatory Visit (INDEPENDENT_AMBULATORY_CARE_PROVIDER_SITE_OTHER): Admitting: Family Medicine

## 2024-03-17 ENCOUNTER — Ambulatory Visit: Payer: Self-pay | Admitting: Family Medicine

## 2024-03-17 ENCOUNTER — Encounter: Payer: Self-pay | Admitting: Family Medicine

## 2024-03-17 VITALS — BP 134/56 | HR 72 | Temp 97.5°F | Wt 131.7 lb

## 2024-03-17 DIAGNOSIS — K922 Gastrointestinal hemorrhage, unspecified: Secondary | ICD-10-CM | POA: Diagnosis not present

## 2024-03-17 DIAGNOSIS — D500 Iron deficiency anemia secondary to blood loss (chronic): Secondary | ICD-10-CM

## 2024-03-17 DIAGNOSIS — R700 Elevated erythrocyte sedimentation rate: Secondary | ICD-10-CM

## 2024-03-17 DIAGNOSIS — N1832 Chronic kidney disease, stage 3b: Secondary | ICD-10-CM | POA: Diagnosis not present

## 2024-03-17 DIAGNOSIS — R634 Abnormal weight loss: Secondary | ICD-10-CM

## 2024-03-17 LAB — CBC WITH DIFFERENTIAL/PLATELET
Basophils Absolute: 0 10*3/uL (ref 0.0–0.1)
Basophils Relative: 0.6 % (ref 0.0–3.0)
Eosinophils Absolute: 0.1 10*3/uL (ref 0.0–0.7)
Eosinophils Relative: 2.6 % (ref 0.0–5.0)
HCT: 32.8 % — ABNORMAL LOW (ref 36.0–46.0)
Hemoglobin: 10.4 g/dL — ABNORMAL LOW (ref 12.0–15.0)
Lymphocytes Relative: 15.5 % (ref 12.0–46.0)
Lymphs Abs: 0.6 10*3/uL — ABNORMAL LOW (ref 0.7–4.0)
MCHC: 31.6 g/dL (ref 30.0–36.0)
MCV: 83.2 fl (ref 78.0–100.0)
Monocytes Absolute: 0.3 10*3/uL (ref 0.1–1.0)
Monocytes Relative: 8.2 % (ref 3.0–12.0)
Neutro Abs: 2.9 10*3/uL (ref 1.4–7.7)
Neutrophils Relative %: 73.1 % (ref 43.0–77.0)
Platelets: 222 10*3/uL (ref 150.0–400.0)
RBC: 3.94 Mil/uL (ref 3.87–5.11)
RDW: 22.3 % — ABNORMAL HIGH (ref 11.5–15.5)
WBC: 4 10*3/uL (ref 4.0–10.5)

## 2024-03-17 LAB — COMPREHENSIVE METABOLIC PANEL WITH GFR
ALT: 18 U/L (ref 0–35)
AST: 39 U/L — ABNORMAL HIGH (ref 0–37)
Albumin: 3.9 g/dL (ref 3.5–5.2)
Alkaline Phosphatase: 82 U/L (ref 39–117)
BUN: 30 mg/dL — ABNORMAL HIGH (ref 6–23)
CO2: 24 meq/L (ref 19–32)
Calcium: 11.5 mg/dL — ABNORMAL HIGH (ref 8.4–10.5)
Chloride: 105 meq/L (ref 96–112)
Creatinine, Ser: 1.7 mg/dL — ABNORMAL HIGH (ref 0.40–1.20)
GFR: 27.27 mL/min — ABNORMAL LOW (ref >60.00)
Glucose, Bld: 123 mg/dL — ABNORMAL HIGH (ref 70–99)
Potassium: 4 meq/L (ref 3.5–5.1)
Sodium: 137 meq/L (ref 135–145)
Total Bilirubin: 0.6 mg/dL (ref 0.2–1.2)
Total Protein: 8 g/dL (ref 6.0–8.3)

## 2024-03-17 NOTE — Patient Instructions (Addendum)
 Start OTC B12 1,000 mcg daily  Reduce the Lorazepam  to one half tablet or stop if possible  Stay OFF the aspirin .    Continue the Eliquis .  Continue with nutrition supplement such as Boost.   Set up follow up with Dr Alyne Babinski in about one month.

## 2024-03-17 NOTE — Progress Notes (Unsigned)
 Established Patient Office Visit  Subjective   Patient ID: Barbara [name], female    DOB: 31-Mar-1941  Age: 83 y.o. MRN: [id number]  Chief Complaint  Patient presents with   Hospitalization Follow-up    HPI  {History (Optional):23778} Barbara [name] is seen for hospital follow-up.  Her primary is on vacation this week.  She has history of apical variant hypertrophic cardiomyopathy, chronic diastolic heart failure, hypertension, atrial fibrillation, chronic kidney disease, history of kidney stones, history of chronic anemia.  Was recently seen here with some progressive weight loss of reportedly 40 pounds during the past year and fatigue.  She had multiple labs done and hemoglobin came back 8.  It was recommended she go to the hospital at that point for further evaluation.  She had been taking Eliquis  and aspirin .  Hemoglobin on admission 7.6 and patient slightly hypotensive.  Received 1 unit of blood and also ended up getting Venofer  iron  infusion prior to discharge.  GI was consulted.  She had EGD which showed no significant findings.  Aspirin  was discontinued and Eliquis  was resumed.  She had decreased heart rate during hospitalization and Toprol -XL was reduced from 100 mg daily to 25 mg daily.  Other chronic medications were resumed.  It was felt that her GI bleed was likely slow GI bleed in the setting of chronic anemia from chronic kidney disease.  No history of melena.  She had noted a little bit of small blood when wiping recently.  Is also noted that she had low B12 of 210 prior to her admission.  She does not take any B12 currently.  She has been reportedly taking boost 3 times daily but weight continues to go down slowly.  Son had concerns that she is having increased sedation possibly from lorazepam .  Frequently sleeps till noon.  Does not take any other sedating medications other than possibly some type of over-the-counter gummy.  Past Medical History:  Diagnosis Date    A-fib [facility])    Allergy    CAD (coronary artery disease)    sees Dr. Magnus Schuller  cardiac stents - 2000   CHF (congestive heart failure) (HCC)    Colon polyps    Complication of anesthesia    rash/hives with "caines"   Dyspnea    02/12/18 " when my heart gets out of rhythm, it has not been out of rhythym- since I have been on Tikosyn  (11/2017)   Dysrhythmia    afib fib   GERD (gastroesophageal reflux disease)    takes OTC- Omeprazole - prn   Heart murmur    History of kidney stones    History of stress test    show normal perfusion without scar or ischemia, post EF 68%   Hx of echocardiogram    show an EF 55%-60% range with grade 1 diastolic dysfunction, she had mitral anular calcification with mild MR, moderate LA dilation and mild pulmonary hypertension with a PA estimated pressure of 39mm   Hyperlipidemia    Hypertension    NSTEMI (non-ST elevated myocardial infarction) (HCC)    Osteoarthritis    Pacemaker    Pneumonia    hx of 2015    PONV (postoperative nausea and vomiting)    Sleep apnea    Past Surgical History:  Procedure Laterality Date   CARDIAC CATHETERIZATION     11/2017   cardiac stents  2000   COLONOSCOPY  01-05-14   per Dr. Delilah Fend, clear, no repeats needed    COLONOSCOPY  CORONARY STENT PLACEMENT  2000   in LAD   CYSTOSCOPY WITH RETROGRADE PYELOGRAM, URETEROSCOPY AND STENT PLACEMENT Left 06/11/2022   Procedure: CYSTOSCOPY WITH RETROGRADE PYELOGRAM, LEFT STENT PLACEMENT;  Surgeon: Samson Croak, MD;  Location: [facility] ORS;  Service: Urology;  Laterality: Left;   CYSTOSCOPY/URETEROSCOPY/HOLMIUM LASER/STENT PLACEMENT Left 06/25/2022   Procedure: CYSTOSCOPY LEFT URETEROSCOPY/HOLMIUM LASER/STENT PLACEMENT;  Surgeon: Homero Luster, MD;  Location: [facility]S;  Service: Urology;  Laterality: Left;  1 HR FOR THIS CASE   DIRECT LARYNGOSCOPY WITH RADIAESSE INJECTION N/A 02/13/2018   Procedure: DIRECT LARYNGOSCOPY WITH RADIAESSE INJECTION;  Surgeon: Virgina Grills, MD;   Location: [facility] OR;  Service: ENT;  Laterality: N/A;   ESOPHAGOGASTRODUODENOSCOPY N/A 03/11/2024   Procedure: EGD (ESOPHAGOGASTRODUODENOSCOPY);  Surgeon: Ozell Blunt, MD;  Location: [facility] ENDOSCOPY;  Service: Gastroenterology;  Laterality: N/A;   EYE SURGERY Left    CATARACT REMOVAL   HIP ARTHROPLASTY Left 09/27/2023   Procedure: ARTHROPLASTY BIPOLAR HIP (HEMIARTHROPLASTY);  Surgeon: Saundra Curl, MD;  Location: [facility] OR;  Service: Orthopedics;  Laterality: Left;   KNEE ARTHROSCOPY Left 01/06/2015   Procedure: LEFT KNEE ARTHROSCOPY, abrasion chondroplasty of the medial femerol condryl,medial and lateral menisectomy, microfracture , synovectomy of the suprpatellar pouch;  Surgeon: Hazle Lites, MD;  Location: [facility] ORS;  Service: Orthopedics;  Laterality: Left;   LEFT HEART CATH AND CORONARY ANGIOGRAPHY N/A 02/26/2019   Procedure: LEFT HEART CATH AND CORONARY ANGIOGRAPHY;  Surgeon: Millicent Ally, MD;  Location: [facility] INVASIVE CV LAB;  Service: Cardiovascular;  Laterality: N/A;   LEFT HEART CATH AND CORONARY ANGIOGRAPHY N/A 05/06/2022   Procedure: LEFT HEART CATH AND CORONARY ANGIOGRAPHY;  Surgeon: Sammy Crisp, MD;  Location: [facility] INVASIVE CV LAB;  Service: Cardiovascular;  Laterality: N/A;   MICROLARYNGOSCOPY W/VOCAL CORD INJECTION N/A 08/07/2018   Procedure: MICROLARYNGOSCOPY WITH VOCAL CORD INJECTION OF PROLARYN;  Surgeon: Virgina Grills, MD;  Location: [facility] OR;  Service: ENT;  Laterality: N/A;  JET VENTILATION   PACEMAKER IMPLANT N/A 05/08/2022   Procedure: PACEMAKER IMPLANT;  Surgeon: Lei Pump, MD;  Location: [facility] INVASIVE CV LAB;  Service: Cardiovascular;  Laterality: N/A;   REVERSE SHOULDER ARTHROPLASTY Right 03/23/2020   Procedure: REVERSE SHOULDER ARTHROPLASTY;  Surgeon: Janeth Medicus, MD;  Location: [facility] OR;  Service: Orthopedics;  Laterality: Right;   VAGINAL HYSTERECTOMY  1971    reports that she quit smoking about 27 years ago. Her smoking use included cigarettes. She started smoking  about 67 years ago. She has never used smokeless tobacco. She reports that she does not drink alcohol and does not use drugs. family history includes Cancer in her maternal grandmother; Heart disease in her maternal grandfather. Allergies  Allergen Reactions   Lidocaine  Anaphylaxis   Morphine  Other (See Comments)    "Body shuts down"   Procaine Hcl Anaphylaxis, Rash and Other (See Comments)    "Anything with 'caine' in it "   Sulfonamide Derivatives Hives   Amlodipine  Swelling   Norco [Hydrocodone -Acetaminophen ] Nausea And Vomiting and Other (See Comments)    States does ok with IV form    Tramadol  Nausea And Vomiting   Vytorin [Ezetimibe-Simvastatin] Other (See Comments)    Joint pain   Tape Itching and Other (See Comments)    Patient prefers either paper tape or Coban wrap    Review of Systems  Constitutional:  Negative for chills and fever.  Respiratory:  Negative for cough and shortness of breath.   Cardiovascular:  Negative for chest pain.  Gastrointestinal:  Negative for abdominal  pain, diarrhea, nausea and vomiting.  Genitourinary:  Negative for dysuria.      Objective:     BP (!) 134/56 (BP Location: Left Arm, Patient Position: Sitting, Cuff Size: Normal)   Pulse 72   Temp (!) 97.5 F (36.4 C) (Oral)   Wt 131 lb 11.2 oz (59.7 kg)   SpO2 95%   BMI 20.63 kg/m  BP Readings from Last 3 Encounters:  03/17/24 (!) 134/56  03/11/24 (!) 159/56  03/08/24 (!) 120/58   Wt Readings from Last 3 Encounters:  03/17/24 131 lb 11.2 oz (59.7 kg)  03/10/24 133 lb 13.1 oz (60.7 kg)  03/08/24 134 lb (60.8 kg)      Physical Exam Vitals reviewed.  Constitutional:      General: She is not in acute distress.    Appearance: She is not ill-appearing.  Cardiovascular:     Rate and Rhythm: Normal rate and regular rhythm.  Pulmonary:     Effort: Pulmonary effort is normal.     Breath sounds: Normal breath sounds. No wheezing or rales.  Skin:    Comments: Nailbeds and  conjunctive are slightly pale  Neurological:     General: No focal deficit present.     Mental Status: She is alert.      No results found for any visits on 03/17/24.  Last CBC Lab Results  Component Value Date   WBC 5.9 03/11/2024   HGB 9.5 (L) 03/11/2024   HCT 30.6 (L) 03/11/2024   MCV 84.1 03/11/2024   MCH 26.1 03/11/2024   RDW 18.6 (H) 03/11/2024   PLT 186 03/11/2024   Last metabolic panel Lab Results  Component Value Date   GLUCOSE 93 03/11/2024   NA 137 03/11/2024   K 4.3 03/11/2024   CL 111 03/11/2024   CO2 18 (L) 03/11/2024   BUN 18 03/11/2024   CREATININE 1.56 (H) 03/11/2024   GFRNONAA 33 (L) 03/11/2024   CALCIUM  10.3 03/11/2024   PHOS 2.2 (L) 03/11/2024   PROT 7.2 03/10/2024   ALBUMIN 2.9 (L) 03/11/2024   LABGLOB 2.5 03/28/2022   AGRATIO 1.8 03/28/2022   BILITOT 1.5 (H) 03/10/2024   ALKPHOS 66 03/10/2024   AST 24 03/10/2024   ALT 12 03/10/2024   ANIONGAP 8 03/11/2024   Last hemoglobin A1c Lab Results  Component Value Date   HGBA1C 5.5 03/08/2024   Last thyroid  functions Lab Results  Component Value Date   TSH 2.515 03/09/2024   Last vitamin B12 and Folate Lab Results  Component Value Date   VITAMINB12 302 03/09/2024   FOLATE 19.5 03/09/2024      The ASCVD Risk score (Arnett DK, et al., 2019) failed to calculate for the following reasons:   The 2019 ASCVD risk score is only valid for ages 43 to 13   Risk score cannot be calculated because patient has a medical history suggesting prior/existing ASCVD    Assessment & Plan:   #1 recent admission for iron  deficiency anemia presumably secondary to slow GI bleed.  EGD unremarkable.  Patient off aspirin  and back on Eliquis .  No obvious melena recently.  Rechecking CBC today.  Patient also had recent iron  infusion in addition to 1 unit of packed red blood cells  #2 B12 deficiency by recent labs with 210 level.  Start over-the-counter B12 1000 mcg daily and follow-up with primary within a few  months to repeat level  #3 weight loss of reportedly 40 pounds over the past year.  Multiple recent labs reviewed.  CT abdomen pelvis during recent admission no acute findings.  Patient currently on boost 3 times daily.  We made some suggestions for increasing caloric intake  #4 history of atrial fibrillation.  Patient on Eliquis .  Reportedly had low heart rate recently on metoprolol  XL 100 mg daily.  This was reduced to 25 mg daily and current heart rate stable around 70 Return in about 1 month (around 04/17/2024).    Glean Lamy, MD

## 2024-03-19 ENCOUNTER — Other Ambulatory Visit

## 2024-03-19 ENCOUNTER — Telehealth: Payer: Self-pay | Admitting: *Deleted

## 2024-03-19 DIAGNOSIS — R700 Elevated erythrocyte sedimentation rate: Secondary | ICD-10-CM

## 2024-03-19 DIAGNOSIS — R634 Abnormal weight loss: Secondary | ICD-10-CM

## 2024-03-19 NOTE — Telephone Encounter (Signed)
 Copied from CRM [id number]. Topic: Clinical - Lab/Test Results >> Mar 19, 2024  9:11 AM Barbara [name] wrote: Reason for CRM: Patient received a call from the office//No note in chart//Please return the patients call

## 2024-03-19 NOTE — Telephone Encounter (Signed)
 Left a detailed message at the patient's home number to call the office and schedule a non-fasting lab appt as the additional testing could not be added to previous labs.

## 2024-03-20 LAB — PTH, INTACT AND CALCIUM
Calcium: 10.8 mg/dL — ABNORMAL HIGH (ref 8.6–10.4)
PTH: 30 pg/mL (ref 16–77)

## 2024-03-22 ENCOUNTER — Ambulatory Visit: Admitting: Family Medicine

## 2024-03-24 ENCOUNTER — Other Ambulatory Visit: Payer: Self-pay | Admitting: Family Medicine

## 2024-03-24 ENCOUNTER — Other Ambulatory Visit: Payer: Self-pay | Admitting: Oncology

## 2024-03-24 ENCOUNTER — Ambulatory Visit
Admission: RE | Admit: 2024-03-24 | Discharge: 2024-03-24 | Disposition: A | Discharge status: Home / Self Care | Source: Ambulatory Visit | Attending: Family Medicine | Admitting: Family Medicine

## 2024-03-24 ENCOUNTER — Encounter: Payer: Self-pay | Admitting: *Deleted

## 2024-03-24 DIAGNOSIS — K56609 Unspecified intestinal obstruction, unspecified as to partial versus complete obstruction: Secondary | ICD-10-CM | POA: Diagnosis not present

## 2024-03-24 DIAGNOSIS — I5032 Chronic diastolic (congestive) heart failure: Secondary | ICD-10-CM | POA: Diagnosis not present

## 2024-03-24 DIAGNOSIS — R634 Abnormal weight loss: Secondary | ICD-10-CM

## 2024-03-24 DIAGNOSIS — D500 Iron deficiency anemia secondary to blood loss (chronic): Secondary | ICD-10-CM

## 2024-03-24 DIAGNOSIS — I10 Essential (primary) hypertension: Secondary | ICD-10-CM | POA: Diagnosis not present

## 2024-03-24 DIAGNOSIS — R768 Other specified abnormal immunological findings in serum: Secondary | ICD-10-CM

## 2024-03-24 LAB — MULTIPLE MYELOMA PANEL, SERUM
Albumin SerPl Elph-Mcnc: 3.2 g/dL (ref 2.9–4.4)
Albumin/Glob SerPl: 0.9 (ref 0.7–1.7)
Alpha 1: 0.3 g/dL (ref 0.0–0.4)
Alpha2 Glob SerPl Elph-Mcnc: 0.7 g/dL (ref 0.4–1.0)
B-Globulin SerPl Elph-Mcnc: 0.9 g/dL (ref 0.7–1.3)
Gamma Glob SerPl Elph-Mcnc: 1.8 g/dL (ref 0.4–1.8)
Globulin, Total: 3.8 g/dL (ref 2.2–3.9)
IgA/Immunoglobulin A, Serum: 188 mg/dL (ref 64–422)
IgG (Immunoglobin G), Serum: 1793 mg/dL — ABNORMAL HIGH (ref 586–1602)
IgM (Immunoglobulin M), Srm: 447 mg/dL — ABNORMAL HIGH (ref 26–217)
Total Protein: 7 g/dL (ref 6.0–8.5)

## 2024-03-24 LAB — SPECIMEN STATUS REPORT

## 2024-03-24 MED ORDER — IOPAMIDOL (ISOVUE-300) INJECTION 61%
70.0000 mL | Freq: Once | INTRAVENOUS | Status: AC | PRN
  Administered 2024-03-24: 70 mL via INTRAVENOUS

## 2024-03-24 NOTE — Progress Notes (Signed)
 Called and spoke with pt and her son. She is scheduled to have labs drawn on Friday May 23 at 12:00  New Patient appt with Dr Randye Buttner on Tuesday, May 27th at 0930  Verbalized understanding

## 2024-03-25 NOTE — Progress Notes (Signed)
 Remote pacemaker transmission.

## 2024-03-25 NOTE — Addendum Note (Signed)
 Addended by: Edra Govern D on: 03/25/2024 03:18 PM   Modules accepted: Orders

## 2024-03-26 ENCOUNTER — Inpatient Hospital Stay: Attending: Oncology

## 2024-03-26 DIAGNOSIS — R768 Other specified abnormal immunological findings in serum: Secondary | ICD-10-CM | POA: Diagnosis not present

## 2024-03-26 DIAGNOSIS — D649 Anemia, unspecified: Secondary | ICD-10-CM | POA: Diagnosis not present

## 2024-03-26 DIAGNOSIS — D500 Iron deficiency anemia secondary to blood loss (chronic): Secondary | ICD-10-CM

## 2024-03-26 DIAGNOSIS — R634 Abnormal weight loss: Secondary | ICD-10-CM | POA: Insufficient documentation

## 2024-03-26 LAB — IRON AND TIBC
Iron: 61 ug/dL (ref 28–170)
Saturation Ratios: 15 % (ref 10.4–31.8)
TIBC: 416 ug/dL (ref 250–450)
UIBC: 355 ug/dL

## 2024-03-26 LAB — FERRITIN: Ferritin: 371 ng/mL — ABNORMAL HIGH (ref 11–307)

## 2024-03-26 LAB — FOLATE: Folate: 20.2 ng/mL (ref >5.9)

## 2024-03-26 LAB — VITAMIN B12: Vitamin B-12: 619 pg/mL (ref 180–914)

## 2024-03-26 MED ORDER — MEGESTROL ACETATE 40 MG PO TABS: 40.0000 mg | ORAL_TABLET | Freq: Every morning | ORAL | 5 refills | Status: DC

## 2024-03-26 NOTE — Addendum Note (Signed)
 Addended by: Corita Diego A on: 03/26/2024 03:57 PM   Modules accepted: Orders

## 2024-03-27 LAB — HAPTOGLOBIN: Haptoglobin: 115 mg/dL (ref 41–333)

## 2024-03-30 ENCOUNTER — Inpatient Hospital Stay: Admitting: Oncology

## 2024-03-30 ENCOUNTER — Inpatient Hospital Stay

## 2024-04-01 LAB — PTH-RELATED PEPTIDE: PTH-related peptide: <2 pmol/L

## 2024-04-02 DIAGNOSIS — H40031 Anatomical narrow angle, right eye: Secondary | ICD-10-CM | POA: Diagnosis not present

## 2024-04-02 DIAGNOSIS — H25811 Combined forms of age-related cataract, right eye: Secondary | ICD-10-CM | POA: Diagnosis not present

## 2024-04-02 DIAGNOSIS — H524 Presbyopia: Secondary | ICD-10-CM | POA: Diagnosis not present

## 2024-04-02 DIAGNOSIS — H40023 Open angle with borderline findings, high risk, bilateral: Secondary | ICD-10-CM | POA: Diagnosis not present

## 2024-04-02 DIAGNOSIS — H04123 Dry eye syndrome of bilateral lacrimal glands: Secondary | ICD-10-CM | POA: Diagnosis not present

## 2024-04-03 DIAGNOSIS — I130 Hypertensive heart and chronic kidney disease with heart failure and stage 1 through stage 4 chronic kidney disease, or unspecified chronic kidney disease: Secondary | ICD-10-CM | POA: Diagnosis not present

## 2024-04-03 DIAGNOSIS — I495 Sick sinus syndrome: Secondary | ICD-10-CM | POA: Diagnosis not present

## 2024-04-03 DIAGNOSIS — I2510 Atherosclerotic heart disease of native coronary artery without angina pectoris: Secondary | ICD-10-CM | POA: Diagnosis not present

## 2024-04-03 DIAGNOSIS — I5032 Chronic diastolic (congestive) heart failure: Secondary | ICD-10-CM | POA: Diagnosis not present

## 2024-04-03 DIAGNOSIS — E1122 Type 2 diabetes mellitus with diabetic chronic kidney disease: Secondary | ICD-10-CM | POA: Diagnosis not present

## 2024-04-03 DIAGNOSIS — D62 Acute posthemorrhagic anemia: Secondary | ICD-10-CM | POA: Diagnosis not present

## 2024-04-03 DIAGNOSIS — I700 Atherosclerosis of aorta: Secondary | ICD-10-CM | POA: Diagnosis not present

## 2024-04-03 DIAGNOSIS — D631 Anemia in chronic kidney disease: Secondary | ICD-10-CM | POA: Diagnosis not present

## 2024-04-03 DIAGNOSIS — N1832 Chronic kidney disease, stage 3b: Secondary | ICD-10-CM | POA: Diagnosis not present

## 2024-04-03 DIAGNOSIS — I4819 Other persistent atrial fibrillation: Secondary | ICD-10-CM | POA: Diagnosis not present

## 2024-04-04 DIAGNOSIS — G4733 Obstructive sleep apnea (adult) (pediatric): Secondary | ICD-10-CM | POA: Diagnosis not present

## 2024-04-05 ENCOUNTER — Other Ambulatory Visit: Payer: Self-pay

## 2024-04-05 ENCOUNTER — Telehealth: Payer: Self-pay | Admitting: Family Medicine

## 2024-04-05 ENCOUNTER — Ambulatory Visit (HOSPITAL_BASED_OUTPATIENT_CLINIC_OR_DEPARTMENT_OTHER)
Admission: RE | Admit: 2024-04-05 | Discharge: 2024-04-05 | Disposition: A | Discharge status: Home / Self Care | Source: Ambulatory Visit | Attending: Oncology | Admitting: Oncology

## 2024-04-05 ENCOUNTER — Encounter: Payer: Self-pay | Admitting: Cardiology

## 2024-04-05 ENCOUNTER — Inpatient Hospital Stay

## 2024-04-05 ENCOUNTER — Inpatient Hospital Stay: Attending: Oncology | Admitting: Oncology

## 2024-04-05 DIAGNOSIS — C9000 Multiple myeloma not having achieved remission: Secondary | ICD-10-CM | POA: Diagnosis not present

## 2024-04-05 DIAGNOSIS — R768 Other specified abnormal immunological findings in serum: Secondary | ICD-10-CM | POA: Diagnosis not present

## 2024-04-05 DIAGNOSIS — R7689 Other specified abnormal immunological findings in serum: Secondary | ICD-10-CM

## 2024-04-05 DIAGNOSIS — R634 Abnormal weight loss: Secondary | ICD-10-CM

## 2024-04-05 DIAGNOSIS — Z87891 Personal history of nicotine dependence: Secondary | ICD-10-CM | POA: Diagnosis not present

## 2024-04-05 DIAGNOSIS — D649 Anemia, unspecified: Secondary | ICD-10-CM | POA: Diagnosis not present

## 2024-04-05 DIAGNOSIS — Z96611 Presence of right artificial shoulder joint: Secondary | ICD-10-CM | POA: Diagnosis not present

## 2024-04-05 DIAGNOSIS — N1832 Chronic kidney disease, stage 3b: Secondary | ICD-10-CM

## 2024-04-05 LAB — CBC WITH DIFFERENTIAL (CANCER CENTER ONLY)
Abs Immature Granulocytes: 0 10*3/uL (ref 0.00–0.07)
Basophils Absolute: 0 10*3/uL (ref 0.0–0.1)
Basophils Relative: 0 %
Eosinophils Absolute: 0.1 10*3/uL (ref 0.0–0.5)
Eosinophils Relative: 3 %
HCT: 34.2 % — ABNORMAL LOW (ref 36.0–46.0)
Hemoglobin: 10.7 g/dL — ABNORMAL LOW (ref 12.0–15.0)
Immature Granulocytes: 0 %
Lymphocytes Relative: 11 %
Lymphs Abs: 0.4 10*3/uL — ABNORMAL LOW (ref 0.7–4.0)
MCH: 27.1 pg (ref 26.0–34.0)
MCHC: 31.3 g/dL (ref 30.0–36.0)
MCV: 86.6 fL (ref 80.0–100.0)
Monocytes Absolute: 0.1 10*3/uL (ref 0.1–1.0)
Monocytes Relative: 4 %
Neutro Abs: 3 10*3/uL (ref 1.7–7.7)
Neutrophils Relative %: 82 %
Platelet Count: 149 10*3/uL — ABNORMAL LOW (ref 150–400)
RBC: 3.95 MIL/uL (ref 3.87–5.11)
RDW: 22.1 % — ABNORMAL HIGH (ref 11.5–15.5)
WBC Count: 3.6 10*3/uL — ABNORMAL LOW (ref 4.0–10.5)
nRBC: 0 % (ref 0.0–0.2)

## 2024-04-05 LAB — CMP (CANCER CENTER ONLY)
ALT: 13 U/L (ref 0–44)
AST: 30 U/L (ref 15–41)
Albumin: 3.8 g/dL (ref 3.5–5.0)
Alkaline Phosphatase: 88 U/L (ref 38–126)
Anion gap: 11 (ref 5–15)
BUN: 29 mg/dL — ABNORMAL HIGH (ref 8–23)
CO2: 25 mmol/L (ref 22–32)
Calcium: 11.5 mg/dL — ABNORMAL HIGH (ref 8.9–10.3)
Chloride: 103 mmol/L (ref 98–111)
Creatinine: 1.46 mg/dL — ABNORMAL HIGH (ref 0.44–1.00)
GFR, Estimated: 35 mL/min — ABNORMAL LOW (ref >60)
Glucose, Bld: 118 mg/dL — ABNORMAL HIGH (ref 70–99)
Potassium: 3.7 mmol/L (ref 3.5–5.1)
Sodium: 139 mmol/L (ref 135–145)
Total Bilirubin: 0.4 mg/dL (ref 0.0–1.2)
Total Protein: 8 g/dL (ref 6.5–8.1)

## 2024-04-05 LAB — URIC ACID: Uric Acid, Serum: 4 mg/dL (ref 2.5–7.1)

## 2024-04-05 LAB — LACTATE DEHYDROGENASE: LDH: 191 U/L (ref 98–192)

## 2024-04-05 NOTE — Progress Notes (Signed)
 Patient had 24 hour Urine Collection Lab ordered by Dr. Randye Buttner. All instructions were verbalized to patient by this writer and patient verbalized understanding. Patient aware she needs to bring the chilled and collected urine to [facility] front desk.

## 2024-04-05 NOTE — Telephone Encounter (Signed)
 Copied from CRM [id number]. Topic: Clinical - Medication Refill >> Apr 05, 2024  1:06 PM [place] A wrote: Medication: nitroGLYCERIN  (NITROSTAT ) 0.4 MG SL tablet  Has the patient contacted their pharmacy? No (Agent: If no, request that the patient contact the pharmacy for the refill. If patient does not wish to contact the pharmacy document the reason why and proceed with request.) (Agent: If yes, when and what did the pharmacy advise?)  This is the patient's preferred pharmacy:  [facility] [address] [place], [place] - [address] [place] [address] Phone: [phone] Fax: [phone]  Is this the correct pharmacy for this prescription? Yes If no, delete pharmacy and type the correct one.   Has the prescription been filled recently? No  Is the patient out of the medication? Yes  Has the patient been seen for an appointment in the last year OR does the patient have an upcoming appointment? Yes  Can we respond through MyChart? Yes  Agent: Please be advised that Rx refills may take up to 3 business days. We ask that you follow-up with your pharmacy.

## 2024-04-05 NOTE — Progress Notes (Signed)
 [facility]  HEMATOLOGY/ONCOLOGY CLINIC CONSULTATION NOTE   PATIENT NAME: Barbara [name]   MR#: [id number] DOB: 04/13/1941  DATE OF SERVICE: 04/05/2024   REFERRING PROVIDER  Glean Lamy, MD  Patient Care Team: Donley Furth, MD as PCP - General (Family Medicine) Millicent Ally, MD as PCP - Cardiology (Cardiology) Lei Pump, MD as PCP - Electrophysiology (Cardiology) Marquetta Sit, MD as Consulting Physician (Family Medicine)   REASON FOR CONSULTATION/ CHIEF COMPLAINT:  Hypercalcemia, elevated IgM, unintentional weight loss  ASSESSMENT & PLAN:  Barbara [name] is a 83 y.o. lady with a past medical history of coronary artery disease, congestive heart failure, A-fib, GERD, hypertension, dyslipidemia, sleep apnea, arthritis, was referred to our service for evaluation of hypercalcemia, elevated IgM, unintentional weight loss.    Hypercalcemia Persistent hypercalcemia with unknown etiology. Differential includes myeloma, especially given concurrent anemia and weight loss. No calcium  supplements reported, but vitamin D  intake noted. Further investigation is required to determine the cause.  Recently PTH related peptide was negative on 03/26/2024.  Calcium  today remains elevated at 11.5.  We will pursue myeloma workup including SPEP, IFE, quantitate immunoglobulins, serum free light chains, 24-hour urine protein electrophoresis and immunoelectrophoresis.  Will also obtain x-ray skeletal survey.  - Check vitamin D  supplement for calcium  content and discontinue if present.  I will call her approximately in 2 to 3 weeks with results.  High total serum IgM On 03/19/2024, SPEP showed no evidence of M spike.  IFE showed polyclonal increase.  Quantitative IgG was slightly elevated at 1593 mg/dL and IgM was increased at 445 mg/dL.  Patient has had hypercalcemia with calcium  level in the range of 10.3-11.5.    Will pursue myeloma workup  as mentioned above including 24-hour urine analysis and x-ray skeletal survey.  Recent CT chest, abdomen and pelvis showed no evidence of pathologic lymphadenopathy that would be indicative of lymphoplasmacytic lymphoma or Waldenstrm's macroglobulinemia.  No clinical symptoms suggestive of hyperviscosity.   Normocytic anemia Chronic anemia with hemoglobin improved from 7.6 g/dL to 9.5 g/dL post-transfusion. Differential includes potential myeloma due to concurrent hypercalcemia and weight loss. Further evaluation is necessary to determine the underlying cause.   Hemoglobin is better at 10.7 today.  MCV 86.6.  White count 3600 with normal differential.  Platelet count normal at 150,000.  We are pursuing additional workup as mentioned above.  No indication for PRBC transfusion unless hemoglobin goes below 7 or if she is symptomatic from anemia.  Abnormal weight loss Unexplained weight loss with decreased appetite. Differential includes potential myeloma. Recent initiation of megestrol  acetate to stimulate appetite. Further monitoring of weight and appetite response to treatment is necessary.  Recent CT chest, abdomen and pelvis was without evidence of obvious malignancy.  - Evaluate for myeloma with additional blood tests, 24-hour urine collection, and full-body x-rays.   I reviewed lab results and outside records for this visit and discussed relevant results with the patient. Diagnosis, plan of care and treatment options were also discussed in detail with the patient. Opportunity provided to ask questions and answers provided to her apparent satisfaction. Provided instructions to call our clinic with any problems, questions or concerns prior to return visit. I recommended to continue follow-up with PCP and sub-specialists. She verbalized understanding and agreed with the plan. No barriers to learning was detected.  Arlo Berber, MD  04/05/2024 9:43 AM  [facility] - A DEPT OF [facility] [address] [place] [address] Dept: [phone] Dept Fax: [phone]   HISTORY OF PRESENT ILLNESS:   Discussed the use of AI scribe software for clinical note transcription with the patient, who gave verbal consent to proceed.  History of Present Illness Barbara [name] is an 83 year old female who presents with blood abnormalities and weight loss. She is accompanied by her sister-in-law. She was referred by her primary doctor for evaluation of elevated calcium , increased IgM level and anemia.  On 03/19/2024, SPEP showed no evidence of M spike.  IFE showed polyclonal increase.  Quantitative IgG was slightly elevated at 1593 mg/dL and IgM was increased at 445 mg/dL.  Patient has had hypercalcemia with calcium  level in the range of 10.3-11.5.  With these findings, she was referred to us  for further evaluation.  She has experienced progressive weight loss, describing it as 'little by little every day.' She required a blood transfusion due to anemia, with a hemoglobin level of 7.6 g/dL at the time of hospitalization, which improved to 9.5 g/dL upon discharge. She experiences fatigue, particularly when walking short distances, despite normal iron  levels.  Recent blood work showed normal levels of iron , folic acid, and vitamin B12. She has recently started taking vitamin B12 supplements. She also takes vitamin D3, 1000 mg daily, purchased over the counter, and denies taking calcium  supplements.  She has experienced a loss of appetite since being placed in physical therapy following a hip fracture. She is currently taking Megestrol , one tablet daily, to help improve her appetite, but reports minimal improvement. She describes early satiety and fears nausea if she continues eating, although she has not vomited.  She notes the presence of bubbles in her urine but no dysuria. She reports knee pain due to arthritis,  which necessitates the use of a cane, and back pain when standing for extended periods.    MEDICAL HISTORY Past Medical History:  Diagnosis Date   A-fib [facility])    Allergy    CAD (coronary artery disease)    sees Dr. Magnus Schuller  cardiac stents - 2000   CHF (congestive heart failure) (HCC)    Colon polyps    Complication of anesthesia    rash/hives with "caines"   Dyspnea    02/12/18 " when my heart gets out of rhythm, it has not been out of rhythym- since I have been on Tikosyn  (11/2017)   Dysrhythmia    afib fib   GERD (gastroesophageal reflux disease)    takes OTC- Omeprazole - prn   Heart murmur    History of kidney stones    History of stress test    show normal perfusion without scar or ischemia, post EF 68%   Hx of echocardiogram    show an EF 55%-60% range with grade 1 diastolic dysfunction, she had mitral anular calcification with mild MR, moderate LA dilation and mild pulmonary hypertension with a PA estimated pressure of 39mm   Hyperlipidemia    Hypertension    NSTEMI (non-ST elevated myocardial infarction) (HCC)    Osteoarthritis    Pacemaker    Pneumonia    hx of 2015    PONV (postoperative nausea and vomiting)    Sleep apnea      SURGICAL HISTORY Past Surgical History:  Procedure Laterality Date   CARDIAC CATHETERIZATION     11/2017   cardiac stents  2000   COLONOSCOPY  01-05-14   per Dr. Delilah Fend, clear, no repeats needed    COLONOSCOPY  CORONARY STENT PLACEMENT  2000   in LAD   CYSTOSCOPY WITH RETROGRADE PYELOGRAM, URETEROSCOPY AND STENT PLACEMENT Left 06/11/2022   Procedure: CYSTOSCOPY WITH RETROGRADE PYELOGRAM, LEFT STENT PLACEMENT;  Surgeon: Samson Croak, MD;  Location: [facility] ORS;  Service: Urology;  Laterality: Left;   CYSTOSCOPY/URETEROSCOPY/HOLMIUM LASER/STENT PLACEMENT Left 06/25/2022   Procedure: CYSTOSCOPY LEFT URETEROSCOPY/HOLMIUM LASER/STENT PLACEMENT;  Surgeon: Homero Luster, MD;  Location: [facility];  Service: Urology;  Laterality: Left;  1 HR  FOR THIS CASE   DIRECT LARYNGOSCOPY WITH RADIAESSE INJECTION N/A 02/13/2018   Procedure: DIRECT LARYNGOSCOPY WITH RADIAESSE INJECTION;  Surgeon: Virgina Grills, MD;  Location: [facility] OR;  Service: ENT;  Laterality: N/A;   ESOPHAGOGASTRODUODENOSCOPY N/A 03/11/2024   Procedure: EGD (ESOPHAGOGASTRODUODENOSCOPY);  Surgeon: Ozell Blunt, MD;  Location: [facility] ENDOSCOPY;  Service: Gastroenterology;  Laterality: N/A;   EYE SURGERY Left    CATARACT REMOVAL   HIP ARTHROPLASTY Left 09/27/2023   Procedure: ARTHROPLASTY BIPOLAR HIP (HEMIARTHROPLASTY);  Surgeon: Saundra Curl, MD;  Location: [facility] OR;  Service: Orthopedics;  Laterality: Left;   KNEE ARTHROSCOPY Left 01/06/2015   Procedure: LEFT KNEE ARTHROSCOPY, abrasion chondroplasty of the medial femerol condryl,medial and lateral menisectomy, microfracture , synovectomy of the suprpatellar pouch;  Surgeon: Hazle Lites, MD;  Location: [facility];  Service: Orthopedics;  Laterality: Left;   LEFT HEART CATH AND CORONARY ANGIOGRAPHY N/A 02/26/2019   Procedure: LEFT HEART CATH AND CORONARY ANGIOGRAPHY;  Surgeon: Millicent Ally, MD;  Location: [facility] INVASIVE CV LAB;  Service: Cardiovascular;  Laterality: N/A;   LEFT HEART CATH AND CORONARY ANGIOGRAPHY N/A 05/06/2022   Procedure: LEFT HEART CATH AND CORONARY ANGIOGRAPHY;  Surgeon: Sammy Crisp, MD;  Location: [facility] INVAS[facility] CV LAB;  Service: Cardiovascular;  Laterality: N/A;   MICROLARYNGOSCOPY W/VOCAL CORD INJECTION N/A 08/07/2018   Procedure: MICROLARYNGOSCOPY WITH VOCAL CORD INJECTION OF PROLARYN;  Surgeon: Virgina Grills, MD;  Location: [facility] OR;  Service: ENT;  Laterality: N/A;  JET VENTILATION   PACEMAKER IMPLANT N/A 05/08/2022   Procedure: PACEMAKER IMPLANT;  Surgeon: Lei Pump, MD;  Location: [facility] INVAS[facility] CV LAB;  Service: Cardiovascular;  Laterality: N/A;   REVERSE SHOULDER ARTHROPLASTY Right 03/23/2020   Procedure: REVERSE SHOULDER ARTHROPLASTY;  Surgeon: Janeth Medicus, MD;  Location: [facility] OR;  Service:  Orthopedics;  Laterality: Right;   VAGINAL HYSTERECTOMY  1971     SOCIAL HISTORY: She reports that she quit smoking about 27 years ago. Her smoking use included cigarettes. She started smoking about 67 years ago. She has never used smokeless tobacco. She reports that she does not drink alcohol and does not use drugs. Social History   Socioeconomic History   Marital status: Widowed    Spouse name: Not on file   Number of children: 2   Years of education: college   Highest education level: High school graduate  Occupational History   Occupation: Retired  Tobacco Use   Smoking status: Former    Current packs/day: 0.00    Types: Cigarettes    Start date: 11/04/1956    Quit date: 11/04/1996    Years since quitting: 27.4   Smokeless tobacco: Never  Vaping Use   Vaping status: Never Used  Substance and Sexual Activity   Alcohol use: No    Alcohol/week: 0.0 standard drinks of alcohol   Drug use: No   Sexual activity: Not Currently  Other Topics Concern   Not on file  Social History Narrative   Not on file   Social Drivers of Health  Financial Resource Strain: Low Risk  (08/05/2022)   Overall Financial Resource Strain (CARDIA)    Difficulty of Paying Living Expenses: Not hard at all  Food Insecurity: No Food Insecurity (03/10/2024)   Hunger Vital Sign    Worried About Running Out of Food in the Last Year: Never true    Ran Out of Food in the Last Year: Never true  Transportation Needs: No Transportation Needs (03/10/2024)   PRAPARE - Administrator, Civil Service (Medical): No    Lack of Transportation (Non-Medical): No  Physical Activity: Insufficiently Active (07/02/2022)   Exercise Vital Sign    Days of Exercise per Week: 1 day    Minutes of Exercise per Session: 20 min  Stress: No Stress Concern Present (07/02/2022)   [employer] of Occupational Health - Occupational Stress Questionnaire    Feeling of Stress : Not at all  Social Connections: Moderately  Integrated (03/10/2024)   Social Connection and Isolation Panel [NHANES]    Frequency of Communication with Friends and Family: More than three times a week    Frequency of Social Gatherings with Friends and Family: More than three times a week    Attends Religious Services: More than 4 times per year    Active Member of [employer] or Organizations: Yes    Attends Banker Meetings: More than 4 times per year    Marital Status: Widowed  Intimate Partner Violence: Not At Risk (03/10/2024)   Humiliation, Afraid, Rape, and Kick questionnaire    Fear of Current or Ex-Partner: No    Emotionally Abused: No    Physically Abused: No    Sexually Abused: No    FAMILY HISTORY: Her family history includes Cancer in her maternal grandmother; Heart disease in her maternal grandfather.  CURRENT MEDICATIONS   Current Outpatient Medications  Medication Instructions   acetaminophen  (TYLENOL ) 500 mg, Daily PRN   alum & mag hydroxide-simeth (MAALOX/MYLANTA) 200-200-20 MG/5ML suspension 30 mLs, Oral, Every 4 hours PRN   apixaban  (ELIQUIS ) 2.5 mg, Oral, 2 times daily   empagliflozin  (JARDIANCE ) 10 mg, Oral, Daily   hydrALAZINE  (APRESOLINE ) 150 mg, Oral, 2 times daily   ipratropium (ATROVENT ) 0.06 % nasal spray 2 sprays, 2 times daily PRN   isosorbide  mononitrate (IMDUR ) 30 mg, Oral, Daily   ketoconazole  (NIZORAL ) 2 % cream 1 Application, Topical, 2 times daily PRN   loratadine  (CLARITIN ) 10 mg, Daily PRN   LORazepam  (ATIVAN ) 0.5 mg, Oral, Every 6 hours PRN, for anxiety   megestrol  (MEGACE ) 40 mg, Oral, Every morning   metoprolol  succinate (TOPROL -XL) 25 mg, Oral, Daily, Take with or immediately following a meal.   nitroGLYCERIN  (NITROSTAT ) 0.4 MG SL tablet DISSOLVE ONE TABLET UNDER THE TONGUE EVERY 5 MINUTES AS NEEDED FOR CHEST PAIN.  DO NOT EXCEED A TOTAL OF 3 DOSES IN 15 MINUTES   pantoprazole  (PROTONIX ) 40 mg, Oral, Daily   Probiotic Product (PROBIOTIC GUMMIES PO) 5 each, Every morning    rosuvastatin  (CRESTOR ) 40 mg, Oral, Daily   triamcinolone  (NASACORT) 55 MCG/ACT nasal inhaler 1 spray, Daily PRN   Vitamin D3 2,000 Units, Every morning     ALLERGIES  She is allergic to lidocaine , morphine , procaine hcl, sulfonamide derivatives, amlodipine , norco [hydrocodone -acetaminophen ], tramadol , vytorin [ezetimibe-simvastatin], and tape.  REVIEW OF SYSTEMS:  Review of Systems - Oncology   Rest of the pertinent review of systems is unremarkable except as mentioned above in HPI.  PHYSICAL EXAMINATION:     Onc Performance Status - 04/05/24 0935  ECOG Perf Status   ECOG Perf Status Capable of only limited selfcare, confined to bed or chair more than 50% of waking hours      KPS SCALE   KPS % SCORE Requires occasional assistance but is able to care for most needs             Vitals:   04/05/24 0929  BP: (!) 121/48  Pulse: 68  Resp: 18  Temp: 98.1 F (36.7 C)  SpO2: 100%   Filed Weights   04/05/24 0929  Weight: 130 lb 6.4 oz (59.1 kg)    Physical Exam Constitutional:      General: She is not in acute distress.    Appearance: Normal appearance.  HENT:     Head: Normocephalic and atraumatic.  Cardiovascular:     Rate and Rhythm: Normal rate and regular rhythm.     Heart sounds: Murmur heard.  Pulmonary:     Effort: Pulmonary effort is normal. No respiratory distress.     Breath sounds: Normal breath sounds.  Abdominal:     General: There is no distension.  Neurological:     General: No focal deficit present.     Mental Status: She is alert and oriented to person, place, and time.  Psychiatric:        Mood and Affect: Mood normal.        Behavior: Behavior normal.      LABORATORY DATA:   I have reviewed the data as listed.  Results for orders placed or performed in visit on 04/05/24  Beta 2 microglobulin, serum  Result Value Ref Range   Beta-2 Microglobulin 5.4 (H) 0.6 - 2.4 mg/L  CBC with Differential (Cancer Center Only)  Result  Value Ref Range   WBC Count 3.6 (L) 4.0 - 10.5 K/uL   RBC 3.95 3.87 - 5.11 MIL/uL   Hemoglobin 10.7 (L) 12.0 - 15.0 g/dL   HCT 16.1 (L) 09.6 - 04.5 %   MCV 86.6 80.0 - 100.0 fL   MCH 27.1 26.0 - 34.0 pg   MCHC 31.3 30.0 - 36.0 g/dL   RDW 40.9 (H) 81.1 - 91.4 %   Platelet Count 149 (L) 150 - 400 K/uL   nRBC 0.0 0.0 - 0.2 %   Neutrophils Relative % 82 %   Neutro Abs 3.0 1.7 - 7.7 K/uL   Lymphocytes Relative 11 %   Lymphs Abs 0.4 (L) 0.7 - 4.0 K/uL   Monocytes Relative 4 %   Monocytes Absolute 0.1 0.1 - 1.0 K/uL   Eosinophils Relative 3 %   Eosinophils Absolute 0.1 0.0 - 0.5 K/uL   Basophils Relative 0 %   Basophils Absolute 0.0 0.0 - 0.1 K/uL   Immature Granulocytes 0 %   Abs Immature Granulocytes 0.00 0.00 - 0.07 K/uL  CMP (Cancer Center only)  Result Value Ref Range   Sodium 139 135 - 145 mmol/L   Potassium 3.7 3.5 - 5.1 mmol/L   Chloride 103 98 - 111 mmol/L   CO2 25 22 - 32 mmol/L   Glucose, Bld 118 (H) 70 - 99 mg/dL   BUN 29 (H) 8 - 23 mg/dL   Creatinine 7.82 (H) 9.56 - 1.00 mg/dL   Calcium  11.5 (H) 8.9 - 10.3 mg/dL   Total Protein 8.0 6.5 - 8.1 g/dL   Albumin 3.8 3.5 - 5.0 g/dL   AST 30 15 - 41 U/L   ALT 13 0 - 44 U/L   Alkaline Phosphatase 88 38 - 126  U/L   Total Bilirubin 0.4 0.0 - 1.2 mg/dL   GFR, Estimated 35 (L) >60 mL/min   Anion gap 11 5 - 15  Kappa/lambda light chains  Result Value Ref Range   Kappa free light chain 79.1 (H) 3.3 - 19.4 mg/L   Lambda free light chains 51.1 (H) 5.7 - 26.3 mg/L   Kappa, lambda light chain ratio 1.55 0.26 - 1.65  Lactate dehydrogenase  Result Value Ref Range   LDH 191 98 - 192 U/L  Uric acid  Result Value Ref Range   Uric Acid, Serum 4.0 2.5 - 7.1 mg/dL     RADIOGRAPHIC STUDIES:  I have personally reviewed the radiological images as listed and agreed with the findings in the report.  CT ABDOMEN PELVIS W CONTRAST Result Date: 03/24/2024 EXAMINATION: CT ABDOMEN PELVIS W CONTRAST (accession [id number] [facility]), CT CHEST W  CONTRAST (accession [id number] [facility]) CLINICAL INDICATION: Female, 83 years old. Bowel obstruction suspected TECHNIQUE: Axial CT of the chest, abdomen and pelvis with 70 cc Isovue  300 intravenous contrast. Multiplanar reformations provided. Unless otherwise specified, incidental thyroid , adrenal, renal lesions do not require dedicated imaging follow up. Additionally, any mentioned pulmonary nodules do not require dedicated imaging follow-up based on the Fleischner guidelines unless otherwise specified. Coronary calcifications are not identified unless otherwise specified. COMPARISON: 03/09/2024 FINDINGS: There is a right shoulder arthroplasty. Dual-chamber cardiac pacemaker noted. The thyroid  appears normal. The thoracic aorta is nonaneurysmal. Scattered atherosclerotic changes are present. The main pulmonary artery is normal in caliber. Dilatation is enlarged. There are coronary calcifications. There is no free fluid or pathologic lymphadenopathy by size criteria. The trachea and mainstem bronchi are patent. Subsegmental atelectatic changes are noted within the lung bases. There is scarring in the lung apices. The lungs are otherwise clear. The liver appears normal. Gallbladder is normal. The spleen is normal. The pancreas is normal. The adrenals are normal. Bilateral renal cysts are seen. The abdominal aorta is normal in caliber. Scattered atherosclerotic changes are present. The bladder is normal. Uterus is surgically absent. Large and small bowel loops are otherwise within normal limits. There is no free fluid or lymphadenopathy. Left hip arthroplasty is seen. There is diffuse osseous demineralization. There are degenerative changes of the spine and bony pelvis. IMPRESSION: No acute findings in the abdomen or pelvis. No bowel obstruction. DOSE REDUCTION: This exam was performed according to our departmental dose-optimization program which includes automated exposure control, adjustment of the mA and/or kV according  to patient size and/or use of iterative reconstruction technique. Electronically signed by: [place] Engel MD 03/24/2024 02:20 PM EDT RP Workstation: [id number]   CT Chest W Contrast Result Date: 03/24/2024 EXAMINATION: CT ABDOMEN PELVIS W CONTRAST (accession [id number] [facility]), CT CHEST W CONTRAST (accession [id number] [facility]) CLINICAL INDICATION: Female, 83 years old. Bowel obstruction suspected TECHNIQUE: Axial CT of the chest, abdomen and pelvis with 70 cc Isovue  300 intravenous contrast. Multiplanar reformations provided. Unless otherwise specified, incidental thyroid , adrenal, renal lesions do not require dedicated imaging follow up. Additionally, any mentioned pulmonary nodules do not require dedicated imaging follow-up based on the Fleischner guidelines unless otherwise specified. Coronary calcifications are not identified unless otherwise specified. COMPARISON: 03/09/2024 FINDINGS: There is a right shoulder arthroplasty. Dual-chamber cardiac pacemaker noted. The thyroid  appears normal. The thoracic aorta is nonaneurysmal. Scattered atherosclerotic changes are present. The main pulmonary artery is normal in caliber. Dilatation is enlarged. There are coronary calcifications. There is no free fluid or pathologic lymphadenopathy by size criteria. The trachea and mainstem bronchi are patent. Subsegmental atelectatic changes are  noted within the lung bases. There is scarring in the lung apices. The lungs are otherwise clear. The liver appears normal. Gallbladder is normal. The spleen is normal. The pancreas is normal. The adrenals are normal. Bilateral renal cysts are seen. The abdominal aorta is normal in caliber. Scattered atherosclerotic changes are present. The bladder is normal. Uterus is surgically absent. Large and small bowel loops are otherwise within normal limits. There is no free fluid or lymphadenopathy. Left hip arthroplasty is seen. There is diffuse osseous demineralization. There are degenerative changes of  the spine and bony pelvis. IMPRESSION: No acute findings in the abdomen or pelvis. No bowel obstruction. DOSE REDUCTION: This exam was performed according to our departmental dose-optimization program which includes automated exposure control, adjustment of the mA and/or kV according to patient size and/or use of iterative reconstruction technique. Electronically signed by: [place] Engel MD 03/24/2024 02:20 PM EDT RP Workstation: [id number]   CT ABDOMEN PELVIS WO CONTRAST Result Date: 03/09/2024 CLINICAL DATA:  Weight loss EXAM: CT ABDOMEN AND PELVIS WITHOUT CONTRAST TECHNIQUE: Multidetector CT imaging of the abdomen and pelvis was performed following the standard protocol without IV contrast. RADIATION DOSE REDUCTION: This exam was performed according to the departmental dose-optimization program which includes automated exposure control, adjustment of the mA and/or kV according to patient size and/or use of iterative reconstruction technique. COMPARISON:  CT abdomen and pelvis 06/09/2021. FINDINGS: Lower chest: There are atelectatic changes in the lung bases. 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 Hepatobiliary: No focal liver abnormality is seen. No gallstones, gallbladder wall thickening, or biliary dilatation. Pancreas: Unremarkable. No pancreatic ductal dilatation or surrounding inflammatory changes. Spleen: Normal in size without focal abnormality. Adrenals/Urinary Tract: Right renal cysts are present measuring up to 3.5 cm. Left renal cysts are present measuring up to 2.3 cm. No urinary tract calculi. No hydronephrosis. Adrenal glands and bladder are within normal limits. There is limited evaluation of bladder secondary to streak artifact in the pelvis. Stomach/Bowel: Stomach is within normal limits. Appendix appears normal. No evidence of bowel wall thickening, distention, or inflammatory changes. There are scattered colonic diverticula. Vascular/Lymphatic: Aortic atherosclerosis. No enlarged abdominal or  pelvic lymph nodes. Reproductive: Status post hysterectomy. No adnexal masses. Other: No abdominal wall hernia or abnormality. No abdominopelvic ascites. Musculoskeletal: Degenerative changes affect the spine. Left hip arthroplasty is present. IMPRESSION: 1. No acute localizing process in the abdomen or pelvis. 2. Bilateral renal cysts. No follow-up imaging recommended. 3. Colonic diverticulosis without evidence for diverticulitis. 4. Aortic atherosclerosis. Aortic Atherosclerosis (ICD10-I70.0). Electronically Signed   By: Tyron Gallon M.D.   On: 03/09/2024 19:58    Orders Placed This Encounter  Procedures   DG Bone Survey Met    Standing Status:   Future    Number of Occurrences:   1    Expected Date:   04/05/2024    Expiration Date:   04/05/2025    Reason for Exam (SYMPTOM  OR DIAGNOSIS REQUIRED):   Suspected multiple myeloma. Please look for bone lytic lesions.    Preferred imaging location?:   [facility]   UPEP/UIFE/Light Chains/TP, 24-Hr Ur    Standing Status:   Future    Expected Date:   04/05/2024    Expiration Date:   04/05/2025   Uric acid    Standing Status:   Future    Number of Occurrences:   1    Expected Date:   04/05/2024    Expiration Date:   04/05/2025   Multiple Myeloma Panel (SPEP&IFE w/QIG)  Standing Status:   Future    Number of Occurrences:   1    Expected Date:   04/05/2024    Expiration Date:   04/05/2025   Lactate dehydrogenase    Standing Status:   Future    Number of Occurrences:   1    Expected Date:   04/05/2024    Expiration Date:   04/05/2025   Kappa/lambda light chains    Standing Status:   Future    Number of Occurrences:   1    Expected Date:   04/05/2024    Expiration Date:   04/05/2025   CMP (Cancer Center only)    Standing Status:   Future    Number of Occurrences:   1    Expected Date:   04/05/2024    Expiration Date:   04/05/2025   CBC with Differential (Cancer Center Only)    Standing Status:   Future    Number of Occurrences:   1    Expected Date:    04/05/2024    Expiration Date:   04/05/2025   Beta 2 microglobulin, serum    Standing Status:   Future    Number of Occurrences:   1    Expected Date:   04/05/2024    Expiration Date:   04/05/2025    Future Appointments  Date Time Provider Department Center  04/19/2024  2:45 PM Mahala Rommel, Gale Jude, MD [facility] None  05/05/2024  2:45 PM Luella Sager, DPM [facility]  05/10/2024  7:25 AM [facility] DEVICE REMOTES [facility] H&V  07/06/2024  8:15 AM [facility] PHLEBOTOMIST [facility] None  07/06/2024  8:45 AM Moosa Bueche, MD [facility] None  08/09/2024  7:25 AM [facility] DEVICE REMOTES [facility]  11/08/2024  7:25 AM [facility] DEVICE REMOTES [facility]  02/07/2025  7:25 AM [facility] DEVICE REMOTES [facility]  05/09/2025  7:25 AM [facility] DEVICE REMOTES [facility]    I spent a total of 60 minutes during this encounter with the patient including review of chart and various tests results, discussions about plan of care and coordination of care plan.  This document was completed utilizing speech recognition software. Grammatical errors, random word insertions, pronoun errors, and incomplete sentences are an occasional consequence of this system due to software limitations, ambient noise, and hardware issues. Any formal questions or concerns about the content, text or information contained within the body of this dictation should be directly addressed to the provider for clarification.

## 2024-04-06 ENCOUNTER — Encounter: Payer: Self-pay | Admitting: Oncology

## 2024-04-06 ENCOUNTER — Telehealth: Payer: Self-pay | Admitting: *Deleted

## 2024-04-06 DIAGNOSIS — R768 Other specified abnormal immunological findings in serum: Secondary | ICD-10-CM | POA: Insufficient documentation

## 2024-04-06 LAB — BETA 2 MICROGLOBULIN, SERUM: Beta-2 Microglobulin: 5.4 mg/L — ABNORMAL HIGH (ref 0.6–2.4)

## 2024-04-06 LAB — KAPPA/LAMBDA LIGHT CHAINS
Kappa free light chain: 79.1 mg/L — ABNORMAL HIGH (ref 3.3–19.4)
Kappa, lambda light chain ratio: 1.55 (ref 0.26–1.65)
Lambda free light chains: 51.1 mg/L — ABNORMAL HIGH (ref 5.7–26.3)

## 2024-04-06 MED ORDER — NITROGLYCERIN 0.4 MG SL SUBL: 0.4000 mg | SUBLINGUAL_TABLET | SUBLINGUAL | 5 refills | Status: AC | PRN

## 2024-04-06 NOTE — Assessment & Plan Note (Addendum)
 On 03/19/2024, SPEP showed no evidence of M spike.  IFE showed polyclonal increase.  Quantitative IgG was slightly elevated at 1593 mg/dL and IgM was increased at 445 mg/dL.  Patient has had hypercalcemia with calcium  level in the range of 10.3-11.5.    Will pursue myeloma workup as mentioned above including 24-hour urine analysis and x-ray skeletal survey.  Recent CT chest, abdomen and pelvis showed no evidence of pathologic lymphadenopathy that would be indicative of lymphoplasmacytic lymphoma or Waldenstrm's macroglobulinemia.  No clinical symptoms suggestive of hyperviscosity.

## 2024-04-06 NOTE — Assessment & Plan Note (Addendum)
 Chronic anemia with hemoglobin improved from 7.6 g/dL to 9.5 g/dL post-transfusion. Differential includes potential myeloma due to concurrent hypercalcemia and weight loss. Further evaluation is necessary to determine the underlying cause.   Hemoglobin is better at 10.7 today.  MCV 86.6.  White count 3600 with normal differential.  Platelet count normal at 150,000.  We are pursuing additional workup as mentioned above.  No indication for PRBC transfusion unless hemoglobin goes below 7 or if she is symptomatic from anemia.

## 2024-04-06 NOTE — Telephone Encounter (Signed)
 Copied from CRM [id number]. Topic: Clinical - Home Health Verbal Orders >> Apr 06, 2024 10:41 AM Allison Arena wrote: Caller/Agency: Rene Carrier, PT with [facility] Callback Number: [phone] Service Requested: Physical Therapy Frequency: 1x / week for 8 weeks Any new concerns about the patient? No

## 2024-04-06 NOTE — Assessment & Plan Note (Signed)
 Persistent hypercalcemia with unknown etiology. Differential includes myeloma, especially given concurrent anemia and weight loss. No calcium  supplements reported, but vitamin D  intake noted. Further investigation is required to determine the cause.  Recently PTH related peptide was negative on 03/26/2024.  Calcium  today remains elevated at 11.5.  We will pursue myeloma workup including SPEP, IFE, quantitate immunoglobulins, serum free light chains, 24-hour urine protein electrophoresis and immunoelectrophoresis.  Will also obtain x-ray skeletal survey.  - Check vitamin D  supplement for calcium  content and discontinue if present.  I will call her approximately in 2 to 3 weeks with results.

## 2024-04-06 NOTE — Assessment & Plan Note (Addendum)
 Unexplained weight loss with decreased appetite. Differential includes potential myeloma. Recent initiation of megestrol  acetate to stimulate appetite. Further monitoring of weight and appetite response to treatment is necessary.  Recent CT chest, abdomen and pelvis was without evidence of obvious malignancy.  - Evaluate for myeloma with additional blood tests, 24-hour urine collection, and full-body x-rays.

## 2024-04-07 LAB — MULTIPLE MYELOMA PANEL, SERUM
Albumin SerPl Elph-Mcnc: 3.1 g/dL (ref 2.9–4.4)
Albumin/Glob SerPl: 0.8 (ref 0.7–1.7)
Alpha 1: 0.3 g/dL (ref 0.0–0.4)
Alpha2 Glob SerPl Elph-Mcnc: 0.7 g/dL (ref 0.4–1.0)
B-Globulin SerPl Elph-Mcnc: 1 g/dL (ref 0.7–1.3)
Gamma Glob SerPl Elph-Mcnc: 1.9 g/dL — ABNORMAL HIGH (ref 0.4–1.8)
Globulin, Total: 3.9 g/dL (ref 2.2–3.9)
IgA: 176 mg/dL (ref 64–422)
IgG (Immunoglobin G), Serum: 1792 mg/dL — ABNORMAL HIGH (ref 586–1602)
IgM (Immunoglobulin M), Srm: 432 mg/dL — ABNORMAL HIGH (ref 26–217)
Total Protein ELP: 7 g/dL (ref 6.0–8.5)

## 2024-04-07 NOTE — Telephone Encounter (Signed)
 Left detailed message for Rene Carrier pt therapist with [facility] regarding approved VO as requested.

## 2024-04-08 NOTE — Telephone Encounter (Signed)
 Spoke with Rene Carrier with [facility] advised of Dr Alyne Babinski approved VO orders requested

## 2024-04-12 ENCOUNTER — Telehealth: Payer: Self-pay | Admitting: Cardiovascular Disease

## 2024-04-12 DIAGNOSIS — I10 Essential (primary) hypertension: Secondary | ICD-10-CM

## 2024-04-12 DIAGNOSIS — I495 Sick sinus syndrome: Secondary | ICD-10-CM

## 2024-04-12 DIAGNOSIS — I480 Paroxysmal atrial fibrillation: Secondary | ICD-10-CM

## 2024-04-12 DIAGNOSIS — R0609 Other forms of dyspnea: Secondary | ICD-10-CM

## 2024-04-12 DIAGNOSIS — D6869 Other thrombophilia: Secondary | ICD-10-CM

## 2024-04-12 DIAGNOSIS — Z950 Presence of cardiac pacemaker: Secondary | ICD-10-CM

## 2024-04-12 DIAGNOSIS — I2510 Atherosclerotic heart disease of native coronary artery without angina pectoris: Secondary | ICD-10-CM

## 2024-04-12 DIAGNOSIS — E785 Hyperlipidemia, unspecified: Secondary | ICD-10-CM

## 2024-04-12 DIAGNOSIS — G4733 Obstructive sleep apnea (adult) (pediatric): Secondary | ICD-10-CM

## 2024-04-12 NOTE — Telephone Encounter (Signed)
 Pt states that she has not been able to use her cpap in a couple of weeks due to equipment/issues with it and has not been able to get it resolved with us . Requesting cb

## 2024-04-14 NOTE — Telephone Encounter (Signed)
Pt requesting a c/b.

## 2024-04-15 ENCOUNTER — Inpatient Hospital Stay

## 2024-04-15 DIAGNOSIS — R768 Other specified abnormal immunological findings in serum: Secondary | ICD-10-CM

## 2024-04-15 NOTE — Telephone Encounter (Signed)
 Patient stated she has been without her CPAP machine for the last 3 weeks and wants a call back to get CPAP supplies.

## 2024-04-16 DIAGNOSIS — G4733 Obstructive sleep apnea (adult) (pediatric): Secondary | ICD-10-CM | POA: Diagnosis not present

## 2024-04-19 ENCOUNTER — Ambulatory Visit (HOSPITAL_BASED_OUTPATIENT_CLINIC_OR_DEPARTMENT_OTHER): Admitting: Oncology

## 2024-04-19 DIAGNOSIS — R768 Other specified abnormal immunological findings in serum: Secondary | ICD-10-CM

## 2024-04-19 NOTE — Progress Notes (Signed)
 [facility]  HEMATOLOGY-ONCOLOGY ELECTRONIC VISIT PROGRESS NOTE  PATIENT NAME: Barbara [name]   MR#: [id number] DOB: 1941-07-11  DATE OF SERVICE: 04/19/2024  Patient Care Team: Johnny Garnette LABOR, MD as PCP - General (Family Medicine) Burnard Debby LABOR, MD as PCP - Cardiology (Cardiology) Inocencio Soyla Lunger, MD as PCP - Electrophysiology (Cardiology) Micheal Wolm ORN, MD as Consulting Physician (Family Medicine)  I connected with the patient via telephone conference and verified that I am speaking with the correct person using two identifiers. The patient's location is at home and I am providing care from the [facility].  I discussed the limitations, risks, security and privacy concerns of performing an evaluation and management service by e-visits and the availability of in person appointments. I also discussed with the patient that there may be a patient responsible charge related to this service. The patient expressed understanding and agreed to proceed.   ASSESSMENT & PLAN:   Barbara [name] is a 83 y.o. lady with a past medical history of coronary artery disease, congestive heart failure, A-fib, GERD, hypertension, dyslipidemia, sleep apnea, arthritis, was referred to our service for evaluation of hypercalcemia, elevated IgM, unintentional weight loss.    High total serum IgM On her consultation with us  on 04/05/2024, calcium  was still elevated at 11.5, creatinine 1.46.  We pursued workup for monoclonal gammopathy.  SPEP showed no M spike.  IFE showed polyclonal increase.  Quantitative IgG was slightly increased at 1792 mg/dL, IgM was increased at 567 mg/dL, IgA normal.  Serum free kappa was increased at 17.1 mg/L, lambda was also increased at 51.1 mg/L, patient normal at 1.55, picture consistent with CKD.  Uric acid was normal at 4.  LDH normal.  X-ray skeletal survey on 04/05/2024 showed no evidence of bone lytic lesions.  On 04/15/2024, 24-hour urine  protein electrophoresis showed no evidence of M spike.  Immunofixation appeared unremarkable.  Both free kappa and free lambda were increased in the urine, again consistent with CKD picture.   - Check vitamin D  supplement for calcium  content and discontinue if present.    On 03/19/2024 and again on 04/05/2024, SPEP showed no evidence of M spike.  IFE showed polyclonal increase.  Quantitative IgG was slightly elevated at 1593 mg/dL and IgM was increased at 445 mg/dL.  Patient has had hypercalcemia with calcium  level in the range of 10.3-11.5.     Recent CT chest, abdomen and pelvis on 03/24/2024 showed no evidence of pathologic lymphadenopathy that would be indicative of lymphoplasmacytic lymphoma or Waldenstrm's macroglobulinemia.   No clinical symptoms suggestive of hyperviscosity.  Overall workup is negative for monoclonal gammopathy.  Hypercalcemia Persistent hypercalcemia with unknown etiology.    On her consultation with us  on 04/05/2024, calcium  was still elevated at 11.5, creatinine 1.46.  We pursued workup for monoclonal gammopathy.  SPEP showed no M spike.  IFE showed polyclonal increase.  Quantitative IgG was slightly increased at 1792 mg/dL, IgM was increased at 567 mg/dL, IgA normal.  Serum free kappa was increased at 17.1 mg/L, lambda was also increased at 51.1 mg/L, patient normal at 1.55, picture consistent with CKD.  Uric acid was normal at 4.  LDH normal.  X-ray skeletal survey on 04/05/2024 showed no evidence of bone lytic lesions.  On 04/15/2024, 24-hour urine protein electrophoresis showed no evidence of M spike.  Immunofixation appeared unremarkable.  Both free kappa and free lambda were increased in the urine, again consistent with CKD picture.   - Check vitamin D   supplement for calcium  content and discontinue if present.  Consider referral to endocrinology for further evaluation and management.  Will defer this to her PCP.   Fluctuating white blood cell count Leukocyte  count fluctuating, currently at 3,600/L, slightly below normal range. Previous fluctuations noted, but no concerning trends identified. Hemoglobin improving at 10.7 g/dL, and platelets are normal.  Chronic kidney disease, unspecified Chronic kidney disease with fluctuating creatinine levels. Recent creatinine at 1.46 mg/dL, improved from previous levels of 1.7-1.8 mg/dL. Highest recent level was 2.5 mg/dL.   I discussed the assessment and treatment plan with the patient. The patient was provided an opportunity to ask questions and all were answered. The patient agreed with the plan and demonstrated an understanding of the instructions. The patient was advised to call back or seek an in-person evaluation if the symptoms worsen or if the condition fails to improve as anticipated.    I spent 12 minutes over the phone with the patient reviewing test results, discuss management and coordination/planning of care.  Ibrahem Volkman, MD  [facility] - A DEPT OF [facility] [address] [place] [address] Dept: [phone] Dept Fax: [phone]   INTERVAL HISTORY:  Please see above for problem oriented charting.  The purpose of today's discussion is to explain recent lab results and to formulate plan of care.  Discussed the use of AI scribe software for clinical note transcription with the patient, who gave verbal consent to proceed.  History of Present Illness Barbara [name] is an 83 year old female who presents with left knee pain and shortness of breath. She is accompanied by her son.  She experiences significant discomfort in her left knee without any specific incident causing the pain. No specific treatments have been attempted for this issue. She mentions feeling weak and notes that her leg continues to hurt.  She has shortness of breath upon walking short distances, which has been persistent. No  specific timeline or exacerbating factors were discussed. No history of fluid around the heart and no recent ultrasound of the heart.  Her recent white blood cell count was 3,600, with previous counts fluctuating. Hemoglobin is currently 10.7, indicating mild anemia, and platelets are normal. Creatinine levels have been variable, with a recent reading of 1.46, suggesting stable kidney function. Her calcium  level was 11.5 on the most recent lab. She has undergone a workup for multiple myeloma, including x-rays and blood tests, with a urine test pending results.    SUMMARY OF HEMATOLOGY HISTORY:  She was referred by her primary doctor for evaluation of elevated calcium , increased IgM level and anemia.   On 03/19/2024, SPEP showed no evidence of M spike.  IFE showed polyclonal increase.  Quantitative IgG was slightly elevated at 1593 mg/dL and IgM was increased at 445 mg/dL.  Patient has had hypercalcemia with calcium  level in the range of 10.3-11.5.  With these findings, she was referred to us  for further evaluation.   She has experienced progressive weight loss, describing it as 'little by little every day.' She required a blood transfusion due to anemia, with a hemoglobin level of 7.6 g/dL at the time of hospitalization, which improved to 9.5 g/dL upon discharge. She experiences fatigue, particularly when walking short distances, despite normal iron  levels.   Recent blood work showed normal levels of iron , folic acid , and vitamin B12. She has recently started taking vitamin B12 supplements. She also takes vitamin D3, 1000 mg daily,  purchased over the counter, and denies taking calcium  supplements.   She has experienced a loss of appetite since being placed in physical therapy following a hip fracture. She is currently taking Megestrol , one tablet daily, to help improve her appetite, but reports minimal improvement. She describes early satiety and fears nausea if she continues eating, although she has  not vomited.  Persistent hypercalcemia with unknown etiology. Differential includes myeloma, especially given concurrent anemia and weight loss. No calcium  supplements reported, but vitamin D  intake noted. Recently PTH related peptide was negative on 03/26/2024.   On her consultation with us  on 04/05/2024, calcium  was still elevated at 11.5, creatinine 1.46.  We pursued workup for monoclonal gammopathy.  SPEP showed no M spike.  IFE showed polyclonal increase.  Quantitative IgG was slightly increased at 1792 mg/dL, IgM was increased at 567 mg/dL, IgA normal.  Serum free kappa was increased at 17.1 mg/L, lambda was also increased at 51.1 mg/L, patient normal at 1.55, picture consistent with CKD.  Uric acid was normal at 4.  LDH normal.  X-ray skeletal survey on 04/05/2024 showed no evidence of bone lytic lesions.  On 04/15/2024, 24-hour urine protein electrophoresis showed no evidence of M spike.  Immunofixation appeared unremarkable.  Both free kappa and free lambda were increased in the urine, again consistent with CKD picture.   - Check vitamin D  supplement for calcium  content and discontinue if present.    On 03/19/2024 and again on 04/05/2024, SPEP showed no evidence of M spike.  IFE showed polyclonal increase.  Quantitative IgG was slightly elevated at 1593 mg/dL and IgM was increased at 445 mg/dL.  Patient has had hypercalcemia with calcium  level in the range of 10.3-11.5.     Recent CT chest, abdomen and pelvis on 03/24/2024 showed no evidence of pathologic lymphadenopathy that would be indicative of lymphoplasmacytic lymphoma or Waldenstrm's macroglobulinemia.   No clinical symptoms suggestive of hyperviscosity.      REVIEW OF SYSTEMS:    Review of Systems - Oncology  All other pertinent systems were reviewed with the patient and are negative.  I have reviewed the past medical history, past surgical history, social history and family history with the patient and they are unchanged from  previous note.  ALLERGIES:  She is allergic to lidocaine , morphine , procaine hcl, sulfonamide derivatives, amlodipine , norco [hydrocodone -acetaminophen ], tramadol , vytorin [ezetimibe-simvastatin], and tape.  MEDICATIONS:  Current Outpatient Medications  Medication Sig Dispense Refill   acetaminophen  (TYLENOL ) 500 MG tablet Take 500 mg by mouth daily as needed for headache.     alum & mag hydroxide-simeth (MAALOX/MYLANTA) 200-200-20 MG/5ML suspension Take 30 mLs by mouth every 4 (four) hours as needed for indigestion. 355 mL 0   apixaban  (ELIQUIS ) 2.5 MG TABS tablet Take 1 tablet (2.5 mg total) by mouth 2 (two) times daily. 180 tablet 3   Cholecalciferol  (VITAMIN D3) 2000 units TABS Take 2,000 Units by mouth in the morning.     empagliflozin  (JARDIANCE ) 10 MG TABS tablet Take 1 tablet (10 mg total) by mouth daily. 90 tablet 3   hydrALAZINE  (APRESOLINE ) 100 MG tablet Take 1.5 tablets (150 mg total) by mouth 2 (two) times daily. 90 tablet 11   ipratropium (ATROVENT ) 0.06 % nasal spray Place 2 sprays into both nostrils 2 (two) times daily as needed (allergies).  5   isosorbide  mononitrate (IMDUR ) 30 MG 24 hr tablet Take 1 tablet (30 mg total) by mouth daily. 90 tablet 3   ketoconazole  (NIZORAL ) 2 % cream Apply 1 Application topically  2 (two) times daily as needed for irritation. 30 g 5   loratadine  (CLARITIN ) 10 MG tablet Take 10 mg by mouth daily as needed for allergies.     LORazepam  (ATIVAN ) 0.5 MG tablet TAKE 1 TABLET BY MOUTH EVERY 6 HOURS AS NEEDED FOR ANXIETY 60 tablet 5   megestrol  (MEGACE ) 40 MG tablet Take 1 tablet (40 mg total) by mouth in the morning. 30 tablet 5   metoprolol  succinate (TOPROL -XL) 25 MG 24 hr tablet Take 1 tablet (25 mg total) by mouth daily. Take with or immediately following a meal. 90 tablet 0   nitroGLYCERIN  (NITROSTAT ) 0.4 MG SL tablet Place 1 tablet (0.4 mg total) under the tongue every 5 (five) minutes as needed for chest pain. 60 tablet 5   pantoprazole   (PROTONIX ) 40 MG tablet Take 1 tablet (40 mg total) by mouth daily. (Patient taking differently: Take 40 mg by mouth as needed.) 90 tablet 3   Probiotic Product (PROBIOTIC GUMMIES PO) Take 5 each by mouth every morning. Align Probiotic     rosuvastatin  (CRESTOR ) 40 MG tablet Take 1 tablet by mouth once daily (Patient taking differently: Take 40 mg by mouth at bedtime.) 90 tablet 3   triamcinolone  (NASACORT) 55 MCG/ACT nasal inhaler Place 1 spray into both nostrils daily as needed (for allergies).      No current facility-administered medications for this visit.    PHYSICAL EXAMINATION:  No exam performed today   LABORATORY DATA:   I have reviewed the data as listed.  Recent Results (from the past 2160 hours)  CUP PACEART REMOTE DEVICE CHECK     Status: None   Collection Time: 02/10/24  1:12 AM  Result Value Ref Range   Date Time Interrogation Session [id number]    Pulse Generator Manufacturer MERM    Pulse Gen Model [id number] Azure XT DR MRI    Pulse Gen Serial Number [id number]    Clinic Name [facility]    Implantable Pulse Generator Type Implantable Pulse Generator    Implantable Pulse Generator Implant Date 79769294    Implantable Lead Manufacturer MERM    Implantable Lead Model 3830 SelectSecure MRI SureScan    Implantable Lead Serial Number [id number]    Implantable Lead Implant Date 79769294    Implantable Lead Location Detail 1 UNKNOWN    Implantable Lead Special Function LBBB    Implantable Lead Location [id number]    Implantable Lead Connection Status [id number]    Implantable Lead Manufacturer MERM    Implantable Lead Model 5076 CapSureFix Novus MRI SureScan    Implantable Lead Serial Number [id number]    Implantable Lead Implant Date 79769294    Implantable Lead Location Detail 1 APPENDAGE    Implantable Lead Location [id number]    Implantable Lead Connection Status [id number]    Lead Channel Setting Sensing Sensitivity 0.9 mV   Lead Channel Setting Pacing Amplitude 1.75 V    Lead Channel Setting Pacing Pulse Width 0.4 ms   Lead Channel Setting Pacing Amplitude 2.5 V   Zone Setting Status [id number]    Zone Setting Status [phone]    Lead Channel Impedance Value 380 ohm   Lead Channel Impedance Value 285 ohm   Lead Channel Sensing Intrinsic Amplitude 1.25 mV   Lead Channel Sensing Intrinsic Amplitude 1.25 mV   Lead Channel Pacing Threshold Amplitude 0.875 V   Lead Channel Pacing Threshold Pulse Width 0.4 ms   Lead Channel Impedance Value 456 ohm   Lead Channel Impedance Value 323 ohm   Lead  Channel Sensing Intrinsic Amplitude 14.875 mV   Lead Channel Sensing Intrinsic Amplitude 14.875 mV   Lead Channel Pacing Threshold Amplitude 1.25 V   Lead Channel Pacing Threshold Pulse Width 0.4 ms   Battery Status OK    Battery Remaining Longevity 137 mo   Battery Voltage 3.04 V   Brady Statistic RA Percent Paced 99.58 %   Brady Statistic RV Percent Paced 0.04 %   Brady Statistic AP VP Percent 0.04 %   Brady Statistic AS VP Percent 0 %   Brady Statistic AP VS Percent 99.57 %   Brady Statistic AS VS Percent 0.39 %  Hepatic function panel     Status: None   Collection Time: 02/10/24  3:18 PM  Result Value Ref Range   Total Protein 7.2 6.0 - 8.5 g/dL   Albumin 4.1 3.7 - 4.7 g/dL   Bilirubin Total 0.6 0.0 - 1.2 mg/dL   Bilirubin, Direct 9.77 0.00 - 0.40 mg/dL   Alkaline Phosphatase 94 44 - 121 IU/L   AST 21 0 - 40 IU/L   ALT 10 0 - 32 IU/L  CUP PACEART INCLINIC DEVICE CHECK     Status: None   Collection Time: 02/10/24  6:19 PM  Result Value Ref Range   Pulse Generator Manufacturer MERM    Date Time Interrogation Session [id number]    Pulse Gen Model [id number] Azure XT DR MRI    Pulse Gen Serial Number [id number]    Clinic Name [facility]    Implantable Pulse Generator Type Implantable Pulse Generator    Implantable Pulse Generator Implant Date 79769294    Implantable Lead Manufacturer MERM    Implantable Lead Model 3830 SelectSecure MRI SureScan     Implantable Lead Serial Number [id number]    Implantable Lead Implant Date 79769294    Implantable Lead Location Detail 1 UNKNOWN    Implantable Lead Special Function LBBB    Implantable Lead Location [id number]    Implantable Lead Connection Status [id number]    Implantable Lead Manufacturer MERM    Implantable Lead Model 5076 CapSureFix Novus MRI SureScan    Implantable Lead Serial Number [id number]    Implantable Lead Implant Date 79769294    Implantable Lead Location Detail 1 APPENDAGE    Implantable Lead Location [id number]    Implantable Lead Connection Status [id number]   Hepatic function panel     Status: None   Collection Time: 03/08/24  3:19 PM  Result Value Ref Range   Total Bilirubin 0.7 0.2 - 1.2 mg/dL   Bilirubin, Direct 0.2 0.0 - 0.3 mg/dL   Alkaline Phosphatase 67 39 - 117 U/L   AST 23 0 - 37 U/L   ALT 10 0 - 35 U/L   Total Protein 7.4 6.0 - 8.3 g/dL   Albumin 3.6 3.5 - 5.2 g/dL  TSH     Status: None   Collection Time: 03/08/24  3:19 PM  Result Value Ref Range   TSH 2.18 0.35 - 5.50 uIU/mL  CBC with Differential/Platelet     Status: Abnormal   Collection Time: 03/08/24  3:19 PM  Result Value Ref Range   WBC 2.8 (L) 4.0 - 10.5 K/uL   RBC 3.26 (L) 3.87 - 5.11 Mil/uL   Hemoglobin 8.0 Repeated and verified X2. (LL) 12.0 - 15.0 g/dL   HCT 74.1 (L) 63.9 - 53.9 %   MCV 79.0 78.0 - 100.0 fl   MCHC 30.9 30.0 - 36.0 g/dL   RDW 82.5 (H) 88.4 - 84.4 %  Platelets 183.0 150.0 - 400.0 K/uL   Neutrophils Relative % 72.1 43.0 - 77.0 %   Lymphocytes Relative 15.2 12.0 - 46.0 %   Monocytes Relative 9.2 3.0 - 12.0 %   Eosinophils Relative 2.6 0.0 - 5.0 %   Basophils Relative 0.9 0.0 - 3.0 %   Neutro Abs 2.1 1.4 - 7.7 K/uL   Lymphs Abs 0.4 (L) 0.7 - 4.0 K/uL   Monocytes Absolute 0.3 0.1 - 1.0 K/uL   Eosinophils Absolute 0.1 0.0 - 0.7 K/uL   Basophils Absolute 0.0 0.0 - 0.1 K/uL  Hemoglobin A1c     Status: None   Collection Time: 03/08/24  3:19 PM  Result Value Ref Range   Hgb A1c MFr  Bld 5.5 4.6 - 6.5 %    Comment: Glycemic Control Guidelines for People with Diabetes:Non Diabetic:  <6%Goal of Therapy: <7%Additional Action Suggested:  >8%   Vitamin B12     Status: Abnormal   Collection Time: 03/08/24  3:19 PM  Result Value Ref Range   Vitamin B-12 210 (L) 211 - 911 pg/mL  CK     Status: None   Collection Time: 03/08/24  3:19 PM  Result Value Ref Range   Total CK 55 7 - 177 U/L  Rheumatoid factor     Status: None   Collection Time: 03/08/24  3:19 PM  Result Value Ref Range   Rheumatoid fact SerPl-aCnc <10 <14 IU/mL  Sedimentation rate     Status: Abnormal   Collection Time: 03/08/24  3:19 PM  Result Value Ref Range   Sed Rate 74 (H) 0 - 30 mm/hr  ANA     Status: Abnormal   Collection Time: 03/08/24  3:19 PM  Result Value Ref Range   Anti Nuclear Antibody (ANA) POSITIVE (A) NEGATIVE    Comment: ANA IFA is a first line screen for detecting the presence of up to approximately 150 autoantibodies in various autoimmune diseases. A positive ANA IFA result is suggestive of autoimmune disease and reflexes to titer and pattern. Further laboratory testing may be considered if clinically indicated. . For additional information, please refer to http://education.[facility].com/faq/FAQ177 (This link is being provided for informational/ educational purposes only.) .   Basic metabolic panel with GFR     Status: Abnormal   Collection Time: 03/08/24  3:19 PM  Result Value Ref Range   Sodium 139 135 - 145 mEq/L   Potassium 3.6 3.5 - 5.1 mEq/L   Chloride 106 96 - 112 mEq/L   CO2 26 19 - 32 mEq/L   Glucose, Bld 101 (H) 70 - 99 mg/dL   BUN 23 6 - 23 mg/dL   Creatinine, Ser 8.15 (H) 0.40 - 1.20 mg/dL   GFR 75.18 (L) >39.99 mL/min    Comment: Calculated using the CKD-EPI Creatinine Equation (2021)   Calcium  10.4 8.4 - 10.5 mg/dL  Anti-nuclear ab-titer (ANA titer)     Status: Abnormal   Collection Time: 03/08/24  3:19 PM  Result Value Ref Range   ANA Titer 1 1:320  (H) titer    Comment:                 Reference Range                 <1:40        Negative                 1:40-1:80    Low Antibody Level                 >  1:80        Elevated Antibody Level .    ANA Pattern 1 Nuclear, Homogeneous (A)     Comment: Homogeneous pattern is associated with systemic lupus erythematosus (SLE), drug-induced lupus and juvenile idiopathic arthritis. . AC-1: Homogeneous . International Consensus on ANA Patterns ([email or web address])   Comprehensive metabolic panel     Status: Abnormal   Collection Time: 03/09/24  4:30 PM  Result Value Ref Range   Sodium 139 135 - 145 mmol/L   Potassium 3.8 3.5 - 5.1 mmol/L   Chloride 105 98 - 111 mmol/L   CO2 23 22 - 32 mmol/L   Glucose, Bld 100 (H) 70 - 99 mg/dL    Comment: Glucose reference range applies only to samples taken after fasting for at least 8 hours.   BUN 23 8 - 23 mg/dL   Creatinine, Ser 8.36 (H) 0.44 - 1.00 mg/dL   Calcium  10.7 (H) 8.9 - 10.3 mg/dL   Total Protein 7.3 6.5 - 8.1 g/dL   Albumin 3.2 (L) 3.5 - 5.0 g/dL   AST 25 15 - 41 U/L   ALT 11 0 - 44 U/L   Alkaline Phosphatase 62 38 - 126 U/L   Total Bilirubin 0.7 0.0 - 1.2 mg/dL   GFR, Estimated 31 (L) >60 mL/min    Comment: (NOTE) Calculated using the CKD-EPI Creatinine Equation (2021)    Anion gap 11 5 - 15    Comment: Performed at [facility], [address], [place], [place] [address]  CBC with Differential     Status: Abnormal   Collection Time: 03/09/24  4:30 PM  Result Value Ref Range   WBC 3.5 (L) 4.0 - 10.5 K/uL   RBC 3.05 (L) 3.87 - 5.11 MIL/uL   Hemoglobin 7.6 (L) 12.0 - 15.0 g/dL   HCT 74.3 (L) 63.9 - 53.9 %   MCV 83.9 80.0 - 100.0 fL   MCH 24.9 (L) 26.0 - 34.0 pg   MCHC 29.7 (L) 30.0 - 36.0 g/dL   RDW 83.0 (H) 88.4 - 84.4 %   Platelets 196 150 - 400 K/uL   nRBC 0.0 0.0 - 0.2 %   Neutrophils Relative % 66 %   Neutro Abs 2.3 1.7 - 7.7 K/uL   Lymphocytes Relative 20 %   Lymphs Abs 0.7 0.7 - 4.0  K/uL   Monocytes Relative 11 %   Monocytes Absolute 0.4 0.1 - 1.0 K/uL   Eosinophils Relative 3 %   Eosinophils Absolute 0.1 0.0 - 0.5 K/uL   Basophils Relative 0 %   Basophils Absolute 0.0 0.0 - 0.1 K/uL   Immature Granulocytes 0 %   Abs Immature Granulocytes 0.01 0.00 - 0.07 K/uL    Comment: Performed at [facility], [address], [place], [place] [address]  Protime-INR     Status: Abnormal   Collection Time: 03/09/24  4:30 PM  Result Value Ref Range   Prothrombin Time 16.1 (H) 11.4 - 15.2 seconds   INR 1.3 (H) 0.8 - 1.2    Comment: (NOTE) INR goal varies based on device and disease states. Performed at [facility], [address], [place], [place] [address]   Type and screen [facility]     Status: None   Collection Time: 03/09/24  4:34 PM  Result Value Ref Range   ABO/RH(D) B POS    Antibody Screen NEG    Sample Expiration 03/12/2024,2359    Unit Number [id number]  Blood Component Type RED CELLS,LR    Unit division 00    Status of Unit ISSUED,FINAL    Transfusion Status OK TO TRANSFUSE    Crossmatch Result      Compatible Performed at [facility], [address] [place], [place] [address]   BPAM RBC     Status: None   Collection Time: 03/09/24  4:34 PM  Result Value Ref Range   ISSUE DATE / TIME [id number]    Blood Product Unit Number [id number]    PRODUCT CODE [id number]    Unit Type and Rh 7300    Blood Product Expiration Date 797493987640   POC occult blood, ED     Status: Abnormal   Collection Time: 03/09/24  5:30 PM  Result Value Ref Range   Fecal Occult Bld POSITIVE (A) NEGATIVE  TSH     Status: None   Collection Time: 03/09/24  6:27 PM  Result Value Ref Range   TSH 2.515 0.350 - 4.500 uIU/mL    Comment: Performed by a 3rd Generation assay with a functional sensitivity of <=0.01 uIU/mL. Performed at [facility], [address], [place], [place] [address]   T4, free     Status: Abnormal    Collection Time: 03/09/24  6:27 PM  Result Value Ref Range   Free T4 1.36 (H) 0.61 - 1.12 ng/dL    Comment: (NOTE) Biotin ingestion may interfere with free T4 tests. If the results are inconsistent with the TSH level, previous test results, or the clinical presentation, then consider biotin interference. If needed, order repeat testing after stopping biotin. Performed at [facility], [address], [place], [place] [address]   Vitamin B12     Status: None   Collection Time: 03/09/24  6:29 PM  Result Value Ref Range   Vitamin B-12 302 180 - 914 pg/mL    Comment: (NOTE) This assay is not validated for testing neonatal or myeloproliferative syndrome specimens for Vitamin B12 levels. Performed at [facility], [address], [place], [place] [address]   Folate     Status: None   Collection Time: 03/09/24  6:29 PM  Result Value Ref Range   Folate 19.5 >5.9 ng/mL    Comment: Performed at [facility], [address] [place], [place] [address]  Iron  and TIBC     Status: Abnormal   Collection Time: 03/09/24  6:29 PM  Result Value Ref Range   Iron  29 28 - 170 ug/dL   TIBC 576 749 - 549 ug/dL   Saturation Ratios 7 (L) 10.4 - 31.8 %   UIBC 394 ug/dL    Comment: Performed at [facility], [address] [place], [place] [address]  Ferritin     Status: None   Collection Time: 03/09/24  6:29 PM  Result Value Ref Range   Ferritin 44 11 - 307 ng/mL    Comment: Performed at [facility], [address] [place], [place] [address]  Reticulocytes     Status: Abnormal   Collection Time: 03/09/24  6:29 PM  Result Value Ref Range   Retic Ct Pct 2.4 0.4 - 3.1 %   RBC. 1.37 (L) 3.87 - 5.11 MIL/uL   Retic Count, Absolute 32.5 19.0 - 186.0 K/uL   Immature Retic Fract 16.8 (H) 2.3 - 15.9 %    Comment: Performed at [facility], [address] [place], [place] [address]  Prepare RBC (crossmatch)  Status: None   Collection Time: 03/09/24  7:42 PM   Result Value Ref Range   Order Confirmation      ORDER PROCESSED BY BLOOD BANK Performed at [facility], [address] [place], [place] [address]   Magnesium      Status: None   Collection Time: 03/10/24  6:02 AM  Result Value Ref Range   Magnesium  1.8 1.7 - 2.4 mg/dL    Comment: Performed at [facility], [address], [place], [place] [address]  Comprehensive metabolic panel     Status: Abnormal   Collection Time: 03/10/24  6:05 AM  Result Value Ref Range   Sodium 139 135 - 145 mmol/L   Potassium 3.3 (L) 3.5 - 5.1 mmol/L   Chloride 107 98 - 111 mmol/L   CO2 23 22 - 32 mmol/L   Glucose, Bld 93 70 - 99 mg/dL    Comment: Glucose reference range applies only to samples taken after fasting for at least 8 hours.   BUN 20 8 - 23 mg/dL   Creatinine, Ser 8.29 (H) 0.44 - 1.00 mg/dL   Calcium  10.8 (H) 8.9 - 10.3 mg/dL   Total Protein 7.2 6.5 - 8.1 g/dL   Albumin 3.1 (L) 3.5 - 5.0 g/dL   AST 24 15 - 41 U/L   ALT 12 0 - 44 U/L   Alkaline Phosphatase 66 38 - 126 U/L   Total Bilirubin 1.5 (H) 0.0 - 1.2 mg/dL   GFR, Estimated 29 (L) >60 mL/min    Comment: (NOTE) Calculated using the CKD-EPI Creatinine Equation (2021)    Anion gap 9 5 - 15    Comment: Performed at [facility], [address] [place], [place] [address]  CBC     Status: Abnormal   Collection Time: 03/10/24  6:05 AM  Result Value Ref Range   WBC 5.1 4.0 - 10.5 K/uL   RBC 3.52 (L) 3.87 - 5.11 MIL/uL   Hemoglobin 9.3 (L) 12.0 - 15.0 g/dL   HCT 70.9 (L) 63.9 - 53.9 %   MCV 82.4 80.0 - 100.0 fL   MCH 26.4 26.0 - 34.0 pg   MCHC 32.1 30.0 - 36.0 g/dL   RDW 82.0 (H) 88.4 - 84.4 %   Platelets 195 150 - 400 K/uL   nRBC 0.0 0.0 - 0.2 %    Comment: Performed at [facility], [address], [place], [place] [address]  Renal function panel     Status: Abnormal   Collection Time: 03/11/24  7:43 AM  Result Value Ref Range   Sodium 137 135 - 145 mmol/L   Potassium 4.3 3.5 - 5.1 mmol/L   Chloride  111 98 - 111 mmol/L   CO2 18 (L) 22 - 32 mmol/L   Glucose, Bld 93 70 - 99 mg/dL    Comment: Glucose reference range applies only to samples taken after fasting for at least 8 hours.   BUN 18 8 - 23 mg/dL   Creatinine, Ser 8.43 (H) 0.44 - 1.00 mg/dL   Calcium  10.3 8.9 - 10.3 mg/dL   Phosphorus 2.2 (L) 2.5 - 4.6 mg/dL   Albumin 2.9 (L) 3.5 - 5.0 g/dL   GFR, Estimated 33 (L) >60 mL/min    Comment: (NOTE) Calculated using the CKD-EPI Creatinine Equation (2021)    Anion gap 8 5 - 15    Comment: Performed at [facility], [address] [place], [place] [address]  Magnesium   Status: None   Collection Time: 03/11/24  7:43 AM  Result Value Ref Range   Magnesium  1.8 1.7 - 2.4 mg/dL    Comment: Performed at [facility], [address] [place], [place] [address]  CBC     Status: Abnormal   Collection Time: 03/11/24  7:43 AM  Result Value Ref Range   WBC 5.9 4.0 - 10.5 K/uL   RBC 3.64 (L) 3.87 - 5.11 MIL/uL   Hemoglobin 9.5 (L) 12.0 - 15.0 g/dL   HCT 69.3 (L) 63.9 - 53.9 %   MCV 84.1 80.0 - 100.0 fL   MCH 26.1 26.0 - 34.0 pg   MCHC 31.0 30.0 - 36.0 g/dL   RDW 81.3 (H) 88.4 - 84.4 %   Platelets 186 150 - 400 K/uL   nRBC 0.0 0.0 - 0.2 %    Comment: Performed at [facility], [address], [place], [place] [address]  CMP     Status: Abnormal   Collection Time: 03/17/24  1:52 PM  Result Value Ref Range   Sodium 137 135 - 145 mEq/L   Potassium 4.0 3.5 - 5.1 mEq/L   Chloride 105 96 - 112 mEq/L   CO2 24 19 - 32 mEq/L   Glucose, Bld 123 (H) 70 - 99 mg/dL   BUN 30 (H) 6 - 23 mg/dL   Creatinine, Ser 8.29 (H) 0.40 - 1.20 mg/dL   Total Bilirubin 0.6 0.2 - 1.2 mg/dL   Alkaline Phosphatase 82 39 - 117 U/L   AST 39 (H) 0 - 37 U/L   ALT 18 0 - 35 U/L   Total Protein 8.0 6.0 - 8.3 g/dL   Albumin 3.9 3.5 - 5.2 g/dL   GFR 72.72 (L) >39.99 mL/min    Comment: Calculated using the CKD-EPI Creatinine Equation (2021)   Calcium  11.5 (H) 8.4 - 10.5 mg/dL  CBC with  Differential/Platelet     Status: Abnormal   Collection Time: 03/17/24  1:52 PM  Result Value Ref Range   WBC 4.0 4.0 - 10.5 K/uL   RBC 3.94 3.87 - 5.11 Mil/uL   Hemoglobin 10.4 (L) 12.0 - 15.0 g/dL   HCT 67.1 (L) 63.9 - 53.9 %   MCV 83.2 78.0 - 100.0 fl   MCHC 31.6 30.0 - 36.0 g/dL   RDW 77.6 (H) 88.4 - 84.4 %   Platelets 222.0 150.0 - 400.0 K/uL   Neutrophils Relative % 73.1 43.0 - 77.0 %   Lymphocytes Relative 15.5 12.0 - 46.0 %   Monocytes Relative 8.2 3.0 - 12.0 %   Eosinophils Relative 2.6 0.0 - 5.0 %   Basophils Relative 0.6 0.0 - 3.0 %   Neutro Abs 2.9 1.4 - 7.7 K/uL   Lymphs Abs 0.6 (L) 0.7 - 4.0 K/uL   Monocytes Absolute 0.3 0.1 - 1.0 K/uL   Eosinophils Absolute 0.1 0.0 - 0.7 K/uL   Basophils Absolute 0.0 0.0 - 0.1 K/uL  Multiple Myeloma Panel (SPEP&IFE w/QIG)     Status: Abnormal   Collection Time: 03/19/24 12:00 AM  Result Value Ref Range   IgG (Immunoglobin G), Serum 1,793 (H) 586 - 1,602 mg/dL   IgA/Immunoglobulin A, Serum 188 64 - 422 mg/dL   IgM (Immunoglobulin M), Srm 447 (H) 26 - 217 mg/dL   Total Protein 7.0 6.0 - 8.5 g/dL   Albumin SerPl Elph-Mcnc 3.2 2.9 - 4.4 g/dL   Alpha 1 0.3 0.0 - 0.4 g/dL   Alpha2 Glob SerPl Elph-Mcnc 0.7 0.4 -  1.0 g/dL   B-Globulin SerPl Elph-Mcnc 0.9 0.7 - 1.3 g/dL   Gamma Glob SerPl Elph-Mcnc 1.8 0.4 - 1.8 g/dL   M Protein SerPl Elph-Mcnc Not Observed Not Observed g/dL   Globulin, Total 3.8 2.2 - 3.9 g/dL   Albumin/Glob SerPl 0.9 0.7 - 1.7   IFE 1 Comment (A)     Comment: Polyclonal increase detected in one or more immunoglobulins.   Please Note Comment     Comment: Protein electrophoresis scan will follow via computer, mail, or courier delivery.   Specimen status report     Status: None   Collection Time: 03/19/24 12:00 AM  Result Value Ref Range   specimen status report Comment     Comment: Please note Please note The date and/or time of collection was not indicated on the requisition as required by state and federal  law.  The date of receipt of the specimen was used as the collection date if not supplied.   PTH, Intact and Calcium      Status: Abnormal   Collection Time: 03/19/24  1:38 PM  Result Value Ref Range   PTH 30 16 - 77 pg/mL    Comment: . Interpretive Guide    Intact PTH           Calcium  ------------------    ----------           ------- Normal Parathyroid    Normal               Normal Hypoparathyroidism    Low or Low Normal    Low Hyperparathyroidism    Primary            Normal or High       High    Secondary          High                 Normal or Low    Tertiary           High                 High Non-Parathyroid    Hypercalcemia      Low or Low Normal    High .    Calcium  10.8 (H) 8.6 - 10.4 mg/dL  Haptoglobin     Status: None   Collection Time: 03/26/24  9:17 AM  Result Value Ref Range   Haptoglobin 115 41 - 333 mg/dL    Comment: (NOTE) Performed At: [facility] [address] [place], [place] [id number] Jennette Shorter MD Ey:[phone]   Vitamin B12     Status: None   Collection Time: 03/26/24  9:17 AM  Result Value Ref Range   Vitamin B-12 619 180 - 914 pg/mL    Comment: (NOTE) This assay is not validated for testing neonatal or myeloproliferative syndrome specimens for Vitamin B12 levels. Performed at [facility], [address], [place], [place] [address]   Iron  and TIBC     Status: None   Collection Time: 03/26/24  9:17 AM  Result Value Ref Range   Iron  61 28 - 170 ug/dL   TIBC 583 749 - 549 ug/dL   Saturation Ratios 15 10.4 - 31.8 %   UIBC 355 ug/dL    Comment: Performed at [facility], [address], [place], [place] [address]  Folate     Status: None   Collection Time: 03/26/24  9:18 AM  Result Value Ref Range  Folate 20.2 >5.9 ng/mL    Comment: Performed at [facility], [address] [place], [place] [address]  Ferritin     Status: Abnormal   Collection Time: 03/26/24  9:18 AM  Result Value Ref Range   Ferritin  371 (H) 11 - 307 ng/mL    Comment: Performed at [facility], [address] [place], [place] [address]  PTH related peptide     Status: None   Collection Time: 03/26/24  9:18 AM  Result Value Ref Range   PTH-related peptide <2.0 pmol/L    Comment: (NOTE) This test was developed and its performance characteristics determined by [facility]. It has not been cleared or approved by the Food and Drug Administration. Reference Range: All Ages: <2.0 The PTHrP assay should not be used to exclude cancer or screen tumor patients for humoral hypercalcemia of malignancy (HHM). The results should always be assessed in conjunction with the patient's medical history, clinical examination, and other findings. If test results are clinically discordant, please contact the laboratory. Performed At: [facility] [address] [place], [place] [id number] Shermon Rogue F MD Ey:[phone]   Beta 2 microglobulin, serum     Status: Abnormal   Collection Time: 04/05/24 10:32 AM  Result Value Ref Range   Beta-2  Microglobulin 5.4 (H) 0.6 - 2.4 mg/L    Comment: (NOTE) Siemens Immulite 2000 Immunochemiluminometric assay (ICMA) Values obtained with different assay methods or kits cannot be used interchangeably. Results cannot be interpreted as absolute evidence of the presence or absence of malignant disease. Performed At: [facility] [address] [place], [place] [id number] Jennette Shorter MD Ey:[phone]   CBC with Differential (Cancer Center Only)     Status: Abnormal   Collection Time: 04/05/24 10:32 AM  Result Value Ref Range   WBC Count 3.6 (L) 4.0 - 10.5 K/uL   RBC 3.95 3.87 - 5.11 MIL/uL   Hemoglobin 10.7 (L) 12.0 - 15.0 g/dL   HCT 65.7 (L) 63.9 - 53.9 %   MCV 86.6 80.0 - 100.0 fL   MCH 27.1 26.0 - 34.0 pg   MCHC 31.3 30.0 - 36.0 g/dL   RDW 77.8 (H) 88.4 - 84.4 %   Platelet Count 149 (L) 150 - 400 K/uL   nRBC 0.0 0.0 - 0.2 %   Neutrophils Relative %  82 %   Neutro Abs 3.0 1.7 - 7.7 K/uL   Lymphocytes Relative 11 %   Lymphs Abs 0.4 (L) 0.7 - 4.0 K/uL   Monocytes Relative 4 %   Monocytes Absolute 0.1 0.1 - 1.0 K/uL   Eosinophils Relative 3 %   Eosinophils Absolute 0.1 0.0 - 0.5 K/uL   Basophils Relative 0 %   Basophils Absolute 0.0 0.0 - 0.1 K/uL   Immature Granulocytes 0 %   Abs Immature Granulocytes 0.00 0.00 - 0.07 K/uL    Comment: Performed at [facility], [address] [place], [place] [address]  CMP (Cancer Center only)     Status: Abnormal   Collection Time: 04/05/24 10:32 AM  Result Value Ref Range   Sodium 139 135 - 145 mmol/L   Potassium 3.7 3.5 - 5.1 mmol/L   Chloride 103 98 - 111 mmol/L   CO2 25 22 - 32 mmol/L   Glucose, Bld 118 (H) 70 - 99 mg/dL    Comment: Glucose reference range applies only to samples taken after fasting for at least 8 hours.   BUN 29 (H) 8 - 23 mg/dL  Creatinine 1.46 (H) 0.44 - 1.00 mg/dL   Calcium  11.5 (H) 8.9 - 10.3 mg/dL   Total Protein 8.0 6.5 - 8.1 g/dL   Albumin 3.8 3.5 - 5.0 g/dL   AST 30 15 - 41 U/L   ALT 13 0 - 44 U/L   Alkaline Phosphatase 88 38 - 126 U/L   Total Bilirubin 0.4 0.0 - 1.2 mg/dL   GFR, Estimated 35 (L) >60 mL/min    Comment: (NOTE) Calculated using the CKD-EPI Creatinine Equation (2021)    Anion gap 11 5 - 15    Comment: Performed at [facility], [address], [place], [place] [address]  Kappa/lambda light chains     Status: Abnormal   Collection Time: 04/05/24 10:32 AM  Result Value Ref Range   Kappa free light chain 79.1 (H) 3.3 - 19.4 mg/L   Lambda free light chains 51.1 (H) 5.7 - 26.3 mg/L   Kappa, lambda light chain ratio 1.55 0.26 - 1.65    Comment: (NOTE) Performed At: [facility]  [address] [place], [place] [id number] Jennette Shorter MD Ey:[phone]   Lactate dehydrogenase     Status: None   Collection Time: 04/05/24 10:32 AM  Result Value Ref Range   LDH 191 98 - 192 U/L    Comment:  Performed at [facility], [address], [place], [place] [address]  Multiple Myeloma Panel (SPEP&IFE w/QIG)     Status: Abnormal   Collection Time: 04/05/24 10:32 AM  Result Value Ref Range   IgG (Immunoglobin G), Serum 1,792 (H) 586 - 1,602 mg/dL   IgA 823 64 - 577 mg/dL   IgM (Immunoglobulin M), Srm 432 (H) 26 - 217 mg/dL   Total Protein ELP 7.0 6.0 - 8.5 g/dL   Albumin SerPl Elph-Mcnc 3.1 2.9 - 4.4 g/dL   Alpha 1 0.3 0.0 - 0.4 g/dL   Alpha2 Glob SerPl Elph-Mcnc 0.7 0.4 - 1.0 g/dL   B-Globulin SerPl Elph-Mcnc 1.0 0.7 - 1.3 g/dL   Gamma Glob SerPl Elph-Mcnc 1.9 (H) 0.4 - 1.8 g/dL   M Protein SerPl Elph-Mcnc Not Observed Not Observed g/dL   Globulin, Total 3.9 2.2 - 3.9 g/dL   Albumin/Glob SerPl 0.8 0.7 - 1.7   IFE 1 Comment (A)     Comment: Polyclonal increase detected in one or more immunoglobulins.   Please Note Comment     Comment: (NOTE) Protein electrophoresis scan will follow via computer, mail, or courier delivery. Performed At: [facility] [address] [place], [place] [id number] Jennette Shorter MD Ey:[phone]   Uric acid     Status: None   Collection Time: 04/05/24 10:32 AM  Result Value Ref Range   Uric Acid, Serum 4.0 2.5 - 7.1 mg/dL    Comment: Performed at [facility], [address], [place], [place] [address]  UPEP/UIFE/Light Chains/TP, 24-Hr Ur     Status: Abnormal   Collection Time: 04/15/24  2:45 PM  Result Value Ref Range   Total Protein, Urine 71.8 Not Estab. mg/dL   Total Protein, Urine-Ur/day 790 (H) 30 - 150 mg/24 hr   Albumin, U 16.3 %   ALPHA 1 URINE 3.8 %   Alpha 2, Urine 26.6 %   % BETA, Urine 20.7 %   GAMMA GLOBULIN URINE 32.5 %   Free Kappa Lt Chains,Ur 500.75 (H) 1.17 - 86.46 mg/L   Free Lambda Lt Chains,Ur 192.55 (H) 0.27 - 15.21 mg/L   Free Kappa/Lambda Ratio 2.60 1.83 - 14.26  Comment: (NOTE) Performed At: [facility] [address] [place], [place] [id number] Jennette Shorter MD Ey:[phone]    Immunofixation Result, Urine Comment     Comment: (NOTE) The immunofixation pattern appears unremarkable. Evidence of monoclonal protein is not apparent.    Total Volume      START TIME 1100 04/14/2024 STOP TIME 1100 04/15/2024    Comment: Performed at [facility], [address], [place], [place] [address]   M-SPIKE %, Urine Not Observed Not Observed %   Note: Comment     Comment: (NOTE) Protein electrophoresis scan will follow via computer, mail, or courier delivery.   Basic Metabolic Panel (BMET)     Status: Abnormal   Collection Time: 04/26/24 12:40 PM  Result Value Ref Range   Glucose 79 70 - 99 mg/dL   BUN 28 (H) 8 - 27 mg/dL   Creatinine, Ser 8.49 (H) 0.57 - 1.00 mg/dL   eGFR 34 (L) >40 fO/fpw/8.26   BUN/Creatinine Ratio 19 12 - 28   Sodium 139 134 - 144 mmol/L   Potassium 3.8 3.5 - 5.2 mmol/L   Chloride 104 96 - 106 mmol/L   CO2 20 20 - 29 mmol/L   Calcium  11.6 (H) 8.7 - 10.3 mg/dL  CBC w/Diff/Platelet     Status: Abnormal   Collection Time: 04/26/24 12:40 PM  Result Value Ref Range   WBC 3.0 (L) 3.4 - 10.8 x10E3/uL   RBC 4.05 3.77 - 5.28 x10E6/uL   Hemoglobin 11.1 11.1 - 15.9 g/dL   Hematocrit 63.7 65.9 - 46.6 %   MCV 89 79 - 97 fL   MCH 27.4 26.6 - 33.0 pg   MCHC 30.7 (L) 31.5 - 35.7 g/dL   RDW 81.2 (H) 88.2 - 84.5 %   Platelets 138 (L) 150 - 450 x10E3/uL   Neutrophils 75 Not Estab. %   Lymphs 16 Not Estab. %   Monocytes 5 Not Estab. %   Eos 4 Not Estab. %   Basos 0 Not Estab. %   Neutrophils Absolute 2.3 1.4 - 7.0 x10E3/uL   Lymphocytes Absolute 0.5 (L) 0.7 - 3.1 x10E3/uL   Monocytes Absolute 0.2 0.1 - 0.9 x10E3/uL   EOS (ABSOLUTE) 0.1 0.0 - 0.4 x10E3/uL   Basophils Absolute 0.0 0.0 - 0.2 x10E3/uL   Immature Granulocytes 0 Not Estab. %   Immature Grans (Abs) 0.0 0.0 - 0.1 x10E3/uL  Lipid panel     Status: Abnormal   Collection Time: 04/26/24 12:40 PM  Result Value Ref Range   Cholesterol, Total 102 100  - 199 mg/dL   Triglycerides 829 (H) 0 - 149 mg/dL   HDL 21 (L) >60 mg/dL   VLDL Cholesterol Cal 29 5 - 40 mg/dL   LDL Chol Calc (NIH) 52 0 - 99 mg/dL   Chol/HDL Ratio 4.9 (H) 0.0 - 4.4 ratio    Comment:                                   T. Chol/HDL Ratio                                             Men  Women  1/2 Avg.Risk  3.4    3.3                                   Avg.Risk  5.0    4.4                                2X Avg.Risk  9.6    7.1                                3X Avg.Risk 23.4   11.0      RADIOGRAPHIC STUDIES:  I have personally reviewed the radiological images as listed and agree with the findings in the report.  DG Bone Survey Met Result Date: 04/11/2024 CLINICAL DATA:  Suspected multiple myeloma. Assess for lytic lesions. EXAM: METASTATIC BONE SURVEY COMPARISON:  None Available. FINDINGS: No suspicious focal lytic lesions throughout the osseous structures. 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 No hardware complicating feature. IMPRESSION: No acute bony abnormality or suspicious focal osseous lesion. Electronically Signed   By: Franky Crease M.D.   On: 04/11/2024 16:36    No orders of the defined types were placed in this encounter.    Future Appointments  Date Time Provider Department Center  05/05/2024  2:45 PM Gaynel Delon CROME, [place] [facility]  05/10/2024  7:25 AM [facility] DEVICE REMOTES [facility]  06/08/2024  2:45 PM [facility] 3 [facility] H&V  07/06/2024  8:15 AM [facility] PHLEBOTOMIST [facility] None  07/06/2024  8:45 AM Michaelangelo Mittelman, [place], MD [facility] None  08/02/2024 11:20 AM Kate Lonni CROME, MD [facility]  08/09/2024  7:25 AM [facility] HVT DEVICE REMOTES [facility]  11/08/2024  7:25 AM [facility] H[facility] DEVICE REMOTES [facility]  H&V  02/07/2025  7:25 AM [facility] HVT DEVICE REMOTES [facility]  05/09/2025  7:25 AM [facility] HVT DEVICE REMOTES [facility]    This document was completed utilizing speech recognition software. Grammatical errors, random word insertions, pronoun errors, and incomplete sentences are an occasional consequence of this system due to software limitations, ambient noise, and hardware issues. Any formal questions or concerns about the content, text or information contained within the body of this dictation should be directly addressed to the provider for clarification.

## 2024-04-21 LAB — UPEP/UIFE/LIGHT CHAINS/TP, 24-HR UR
% BETA, Urine: 20.7 %
ALPHA 1 URINE: 3.8 %
Albumin, U: 16.3 %
Alpha 2, Urine: 26.6 %
Free Kappa Lt Chains,Ur: 500.75 mg/L — ABNORMAL HIGH (ref 1.17–86.46)
Free Kappa/Lambda Ratio: 2.6 (ref 1.83–14.26)
Free Lambda Lt Chains,Ur: 192.55 mg/L — ABNORMAL HIGH (ref 0.27–15.21)
GAMMA GLOBULIN URINE: 32.5 %
Total Protein, Urine-Ur/day: 790 mg/(24.h) — ABNORMAL HIGH (ref 30–150)
Total Protein, Urine: 71.8 mg/dL

## 2024-04-25 NOTE — Progress Notes (Unsigned)
 Cardiology Office Note:    Date:  04/26/2024   ID:  Barbara [name], DOB [birth year], MRN [id number]  PCP:  Johnny Garnette LABOR, MD  Cardiologist:  Debby Sor, MD  Electrophysiologist:  Soyla Gladis Norton, MD   Referring MD: Johnny Garnette LABOR, MD   Chief Complaint  Patient presents with   Coronary Artery Disease    History of Present Illness:    Barbara [name] is a 83 y.o. female with a hx of CAD, apical HCM, OSA, SSS status post PPM, atrial fibrillation, CKD hypertension, hyperlipidemia who presents for follow-up.  She previously followed with Dr. Sor.  Underwent PCI to LAD with BMS in 2000.  Most recent cath in 05/2022 showed patent LAD stent with 30% in-stent restenosis and mild nonobstructive CAD.  CMR 05/2020 showed apical hypertrophy measuring up to 15 mm, consistent with apical HCM, patchy LGE at apex and RV insertion site (2% of total myocardial mass), normal biventricular size and hyperdynamic systolic function.  Echocardiogram 05/2022 showed EF greater than 75% normal RV function, moderate mitral regurgitation.  Since last clinic visit, she reports she is doing okay.  States that having issues with her CPAP machine.  Also having dyspnea with minimal exertion.  She denies any chest pain.  She has been feeling weak and lightheaded, denies any syncope.  Reports some swelling in ankles.  No palpitations.  Reports having issues with her CPAP, has not been using.  She was admitted in May with anemia.  Unclear source, she has been restarted on Eliquis .  She has not noticed any bleeding.  Past Medical History:  Diagnosis Date   A-fib [facility])    Allergy    CAD (coronary artery disease)    sees Dr. Debby Sor  cardiac stents - 2000   CHF (congestive heart failure) (HCC)    Colon polyps    Complication of anesthesia    rash/hives with caines   Dyspnea    02/12/18  when my heart gets out of rhythm, it has not been out of rhythym- since I have been on Tikosyn  (11/2017)    Dysrhythmia    afib fib   GERD (gastroesophageal reflux disease)    takes OTC- Omeprazole - prn   Heart murmur    History of kidney stones    History of stress test    show normal perfusion without scar or ischemia, post EF 68%   Hx of echocardiogram    show an EF 55%-60% range with grade 1 diastolic dysfunction, she had mitral anular calcification with mild MR, moderate LA dilation and mild pulmonary hypertension with a PA estimated pressure of 39mm   Hyperlipidemia    Hypertension    NSTEMI (non-ST elevated myocardial infarction) (HCC)    Osteoarthritis    Pacemaker    Pneumonia    hx of 2015    PONV (postoperative nausea and vomiting)    Sleep apnea     Past Surgical History:  Procedure Laterality Date   CARDIAC CATHETERIZATION     11/2017   cardiac stents  2000   COLONOSCOPY  01-05-14   per Dr. Norleen Hint, clear, no repeats needed    COLONOSCOPY     CORONARY STENT PLACEMENT  2000   in LAD   CYSTOSCOPY WITH RETROGRADE PYELOGRAM, URETEROSCOPY AND STENT PLACEMENT Left 06/11/2022   Procedure: CYSTOSCOPY WITH RETROGRADE PYELOGRAM, LEFT STENT PLACEMENT;  Surgeon: Carolee Sherwood JONETTA DOUGLAS, MD;  Location: [facility];  Service: Urology;  Laterality: Left;   CYSTOSCOPY/URETEROSCOPY/HOLMIUM LASER/STENT PLACEMENT  Left 06/25/2022   Procedure: CYSTOSCOPY LEFT URETEROSCOPY/HOLMIUM LASER/STENT PLACEMENT;  Surgeon: Watt Rush, MD;  Location: [facility];  Service: Urology;  Laterality: Left;  1 HR FOR THIS CASE   DIRECT LARYNGOSCOPY WITH RADIAESSE INJECTION N/A 02/13/2018   Procedure: DIRECT LARYNGOSCOPY WITH RADIAESSE INJECTION;  Surgeon: Carlie Clark, MD;  Location: [facility] OR;  Service: ENT;  Laterality: N/A;   ESOPHAGOGASTRODUODENOSCOPY N/A 03/11/2024   Procedure: EGD (ESOPHAGOGASTRODUODENOSCOPY);  Surgeon: Rosalie Kitchens, MD;  Location: [facility] ENDOSCOPY;  Service: Gastroenterology;  Laterality: N/A;   EYE SURGERY Left    CATARACT REMOVAL   HIP ARTHROPLASTY Left 09/27/2023   Procedure: ARTHROPLASTY BIPOLAR HIP  (HEMIARTHROPLASTY);  Surgeon: Beverley Evalene BIRCH, MD;  Location: [facility] OR;  Service: Orthopedics;  Laterality: Left;   KNEE ARTHROSCOPY Left 01/06/2015   Procedure: LEFT KNEE ARTHROSCOPY, abrasion chondroplasty of the medial femerol condryl,medial and lateral menisectomy, microfracture , synovectomy of the suprpatellar pouch;  Surgeon: Tanda Heading, MD;  Location: [facility];  Service: Orthopedics;  Laterality: Left;   LEFT HEART CATH AND CORONARY ANGIOGRAPHY N/A 02/26/2019   Procedure: LEFT HEART CATH AND CORONARY ANGIOGRAPHY;  Surgeon: Burnard Debby LABOR, MD;  Location: [facility] INVASIVE CV LAB;  Service: Cardiovascular;  Laterality: N/A;   LEFT HEART CATH AND CORONARY ANGIOGRAPHY N/A 05/06/2022   Procedure: LEFT HEART CATH AND CORONARY ANGIOGRAPHY;  Surgeon: Mady Bruckner, MD;  Location: [facility] INVASIVE CV LAB;  Service: Cardiovascular;  Laterality: N/A;   MICROLARYNGOSCOPY W/VOCAL CORD INJECTION N/A 08/07/2018   Procedure: MICROLARYNGOSCOPY WITH VOCAL CORD INJECTION OF PROLARYN;  Surgeon: Carlie Clark, MD;  Location: [facility] OR;  Service: ENT;  Laterality: N/A;  JET VENTILATION   PACEMAKER IMPLANT N/A 05/08/2022   Procedure: PACEMAKER IMPLANT;  Surgeon: Inocencio Soyla Lunger, MD;  Location: [facility] INVASIVE CV LAB;  Service: Cardiovascular;  Laterality: N/A;   REVERSE SHOULDER ARTHROPLASTY Right 03/23/2020   Procedure: REVERSE SHOULDER ARTHROPLASTY;  Surgeon: Sharl Selinda Dover, MD;  Location: [facility] OR;  Service: Orthopedics;  Laterality: Right;   VAGINAL HYSTERECTOMY  1971    Current Medications: Current Meds  Medication Sig   acetaminophen  (TYLENOL ) 500 MG tablet Take 500 mg by mouth daily as needed for headache.   alum & mag hydroxide-simeth (MAALOX/MYLANTA) 200-200-20 MG/5ML suspension Take 30 mLs by mouth every 4 (four) hours as needed for indigestion.   apixaban  (ELIQUIS ) 2.5 MG TABS tablet Take 1 tablet (2.5 mg total) by mouth 2 (two) times daily.   Cholecalciferol  (VITAMIN D3) 2000 units TABS Take 2,000 Units by  mouth in the morning.   empagliflozin  (JARDIANCE ) 10 MG TABS tablet Take 1 tablet (10 mg total) by mouth daily.   hydrALAZINE  (APRESOLINE ) 100 MG tablet Take 1.5 tablets (150 mg total) by mouth 2 (two) times daily.   ipratropium (ATROVENT ) 0.06 % nasal spray Place 2 sprays into both nostrils 2 (two) times daily as needed (allergies).   isosorbide  mononitrate (IMDUR ) 30 MG 24 hr tablet Take 1 tablet (30 mg total) by mouth daily.   ketoconazole  (NIZORAL ) 2 % cream Apply 1 Application topically 2 (two) times daily as needed for irritation.   loratadine  (CLARITIN ) 10 MG tablet Take 10 mg by mouth daily as needed for allergies.   LORazepam  (ATIVAN ) 0.5 MG tablet TAKE 1 TABLET BY MOUTH EVERY 6 HOURS AS NEEDED FOR ANXIETY   megestrol  (MEGACE ) 40 MG tablet Take 1 tablet (40 mg total) by mouth in the morning.   metoprolol  succinate (TOPROL -XL) 25 MG 24 hr tablet Take 1 tablet (25 mg total) by mouth daily.  Take with or immediately following a meal.   nitroGLYCERIN  (NITROSTAT ) 0.4 MG SL tablet Place 1 tablet (0.4 mg total) under the tongue every 5 (five) minutes as needed for chest pain.   pantoprazole  (PROTONIX ) 40 MG tablet Take 1 tablet (40 mg total) by mouth daily. (Patient taking differently: Take 40 mg by mouth as needed.)     Allergies:   Lidocaine , Morphine , Procaine hcl, Sulfonamide derivatives, Amlodipine , Norco [hydrocodone -acetaminophen ], Tramadol , Vytorin [ezetimibe-simvastatin], and Tape   Social History   Socioeconomic History   Marital status: Widowed    Spouse name: Not on file   Number of children: 2   Years of education: college   Highest education level: High school graduate  Occupational History   Occupation: Retired  Tobacco Use   Smoking status: Former    Current packs/day: 0.00    Types: Cigarettes    Start date: 11/04/1956    Quit date: 11/04/1996    Years since quitting: 27.4   Smokeless tobacco: Never  Vaping Use   Vaping status: Never Used  Substance and Sexual  Activity   Alcohol use: No    Alcohol/week: 0.0 standard drinks of alcohol   Drug use: No   Sexual activity: Not Currently  Other Topics Concern   Not on file  Social History Narrative   Not on file   Social Drivers of Health   Financial Resource Strain: Low Risk  (08/05/2022)   Overall Financial Resource Strain (CARDIA)    Difficulty of Paying Living Expenses: Not hard at all  Food Insecurity: No Food Insecurity (04/05/2024)   Hunger Vital Sign    Worried About Running Out of Food in the Last Year: Never true    Ran Out of Food in the Last Year: Never true  Transportation Needs: No Transportation Needs (04/05/2024)   PRAPARE - Administrator, Civil Service (Medical): No    Lack of Transportation (Non-Medical): No  Physical Activity: Insufficiently Active (07/02/2022)   Exercise Vital Sign    Days of Exercise per Week: 1 day    Minutes of Exercise per Session: 20 min  Stress: No Stress Concern Present (07/02/2022)   [employer] of Occupational Health - Occupational Stress Questionnaire    Feeling of Stress : Not at all  Social Connections: Moderately Integrated (03/10/2024)   Social Connection and Isolation Panel    Frequency of Communication with Friends and Family: More than three times a week    Frequency of Social Gatherings with Friends and Family: More than three times a week    Attends Religious Services: More than 4 times per year    Active Member of [employer] or Organizations: Yes    Attends Banker Meetings: More than 4 times per year    Marital Status: Widowed     Family History: The patient's family history includes Cancer in her maternal grandmother; Heart disease in her maternal grandfather.  ROS:   Please see the history of present illness.     All other systems reviewed and are negative.  EKGs/Labs/Other Studies Reviewed:    The following studies were reviewed today:   EKG:   04/26/24: Atrial paced rhythm, rate 64, T wave inversions  in the inferior leads and V3-6  Recent Labs: 03/09/2024: TSH 2.515 03/11/2024: Magnesium  1.8 04/05/2024: ALT 13; BUN 29; Creatinine 1.46; Hemoglobin 10.7; Platelet Count 149; Potassium 3.7; Sodium 139  Recent Lipid Panel    Component Value Date/Time   CHOL 148 06/13/2021 1459   TRIG  244.0 (H) 06/13/2021 1459   HDL 57.60 06/13/2021 1459   CHOLHDL 3 06/13/2021 1459   VLDL 48.8 (H) 06/13/2021 1459   LDLCALC 50 10/08/2020 0313   LDLDIRECT 54.0 06/13/2021 1459    Physical Exam:    VS:  BP 130/60   Pulse 64   Ht 5' 7 (1.702 m)   Wt 123 lb 12.8 oz (56.2 kg)   SpO2 97%   BMI 19.39 kg/m     Wt Readings from Last 3 Encounters:  04/26/24 123 lb 12.8 oz (56.2 kg)  04/05/24 130 lb 6.4 oz (59.1 kg)  03/17/24 131 lb 11.2 oz (59.7 kg)     GEN:  Well nourished, well developed in no acute distress HEENT: Normal NECK: No JVD; No carotid bruits LYMPHATICS: No lymphadenopathy CARDIAC: RRR, no murmurs, rubs, gallops RESPIRATORY:  Clear to auscultation without rales, wheezing or rhonchi  ABDOMEN: Soft, non-tender, non-distended MUSCULOSKELETAL:  No edema; No deformity  SKIN: Warm and dry NEUROLOGIC:  Alert and oriented x 3 PSYCHIATRIC:  Normal affect   ASSESSMENT:    1. Coronary artery disease involving native coronary artery of native heart without angina pectoris   2. Essential hypertension   3. Mitral valve insufficiency, unspecified etiology   4. OSA on CPAP   5. Tachycardia-bradycardia syndrome (HCC)   6. Secondary hypercoagulable state (HCC)   7. PAF (paroxysmal atrial fibrillation) (HCC)   8. OSA (obstructive sleep apnea)   9. Cardiac pacemaker in situ   10. Hyperlipidemia LDL goal <70    PLAN:    CAD: Underwent PCI to LAD with BMS in 2000.  Most recent cath in 05/2022 showed patent LAD stent with 30% in-stent restenosis and mild nonobstructive CAD. - Continue Eliquis , rosuvastatin   Apical HCM: CMR 05/2020 showed apical hypertrophy measuring up to 15 mm, consistent with  apical HCM, patchy LGE at apex and RV insertion site (2% of total myocardial mass), normal biventricular size and hyperdynamic systolic function.  Mitral regurgitation: Moderate on echocardiogram 05/2022.  Will repeat echocardiogram to monitor  DOE: Unclear cause.  Check echocardiogram as above.  Recent anemia may be contributing, will check CBC  SSS status post PPM: Follows with EP  Atrial fibrillation: Developed tachycardia-bradycardia syndrome and underwent PPM 05/2022.  She is on Amio 200 mg daily and Eliquis  2.5 mg twice daily  CKD: Most recent creatinine 1.46.  Will update BMET  Hypertension: On hydralazine  150 mg twice daily, Imdur  30 mg daily, Toprol -XL 25 mg daily.  Appears controlled  Hyperlipidemia: On rosuvastatin  40 mg daily.  Check lipid panel  OSA: On CPAP reports having issues, has previously followed with Dr. Burnard.  Will reach out to sleep coordinator, patient will need to establish with Dr. Shlomo  RTC in 3 months  Medication Adjustments/Labs and Tests Ordered: Current medicines are reviewed at length with the patient today.  Concerns regarding medicines are outlined above.  Orders Placed This Encounter  Procedures   For home use only DME continuous positive airway pressure (CPAP)   Basic Metabolic Panel (BMET)   CBC w/Diff/Platelet   Lipid panel   EKG 12-Lead   ECHOCARDIOGRAM COMPLETE   No orders of the defined types were placed in this encounter.   Patient Instructions  Medication Instructions:  Continue current medications *If you need a refill on your cardiac medications before your next appointment, please call your pharmacy*  Lab Work: Bmet, cbc, lipid today If you have labs (blood work) drawn today and your tests are completely normal, you will receive  your results only by: MyChart Message (if you have MyChart) OR A paper copy in the mail If you have any lab test that is abnormal or we need to change your treatment, we will call you to review the  results.  Testing/Procedures: Echo  Your physician has requested that you have an echocardiogram. Echocardiography is a painless test that uses sound waves to create images of your heart. It provides your doctor with information about the size and shape of your heart and how well your heart's chambers and valves are working. This procedure takes approximately one hour. There are no restrictions for this procedure. Please do NOT wear cologne, perfume, aftershave, or lotions (deodorant is allowed). Please arrive 15 minutes prior to your appointment time.  Please note: We ask at that you not bring children with you during ultrasound (echo/ vascular) testing. Due to room size and safety concerns, children are not allowed in the ultrasound rooms during exams. Our front office staff cannot provide observation of children in our lobby area while testing is being conducted. An adult accompanying a patient to their appointment will only be allowed in the ultrasound room at the discretion of the ultrasound technician under special circumstances. We apologize for any inconvenience.   Follow-Up: At [facility], you and your health needs are our priority.  As part of our continuing mission to provide you with exceptional heart care, our providers are all part of one team.  This team includes your primary Cardiologist (physician) and Advanced Practice Providers or APPs (Physician Assistants and Nurse Practitioners) who all work together to provide you with the care you need, when you need it.  Your next appointment:   3 month(s)  Provider:   Dr. Kate We recommend signing up for the patient portal called MyChart.  Sign up information is provided on this After Visit Summary.  MyChart is used to connect with patients for Virtual Visits (Telemedicine).  Patients are able to view lab/test results, encounter notes, upcoming appointments, etc.  Non-urgent messages can be sent to your provider as well.    To learn more about what you can do with MyChart, go to [email or web address].   Other Instructions none       Signed, Lonni LITTIE Kate, MD  04/26/2024 5:42 PM    [facility]

## 2024-04-26 ENCOUNTER — Encounter: Payer: Self-pay | Admitting: *Deleted

## 2024-04-26 ENCOUNTER — Ambulatory Visit: Attending: Cardiology | Admitting: Cardiology

## 2024-04-26 ENCOUNTER — Encounter: Payer: Self-pay | Admitting: Cardiology

## 2024-04-26 VITALS — BP 130/60 | HR 64 | Ht 67.0 in | Wt 123.8 lb

## 2024-04-26 DIAGNOSIS — Z950 Presence of cardiac pacemaker: Secondary | ICD-10-CM | POA: Diagnosis not present

## 2024-04-26 DIAGNOSIS — I2510 Atherosclerotic heart disease of native coronary artery without angina pectoris: Secondary | ICD-10-CM | POA: Diagnosis not present

## 2024-04-26 DIAGNOSIS — I480 Paroxysmal atrial fibrillation: Secondary | ICD-10-CM

## 2024-04-26 DIAGNOSIS — D6869 Other thrombophilia: Secondary | ICD-10-CM | POA: Diagnosis not present

## 2024-04-26 DIAGNOSIS — I340 Nonrheumatic mitral (valve) insufficiency: Secondary | ICD-10-CM

## 2024-04-26 DIAGNOSIS — G4733 Obstructive sleep apnea (adult) (pediatric): Secondary | ICD-10-CM

## 2024-04-26 DIAGNOSIS — I495 Sick sinus syndrome: Secondary | ICD-10-CM

## 2024-04-26 DIAGNOSIS — E785 Hyperlipidemia, unspecified: Secondary | ICD-10-CM | POA: Diagnosis not present

## 2024-04-26 DIAGNOSIS — I10 Essential (primary) hypertension: Secondary | ICD-10-CM | POA: Diagnosis not present

## 2024-04-26 LAB — LIPID PANEL

## 2024-04-26 NOTE — Telephone Encounter (Signed)
 Order sent to [facility] for CPAP supplies. Left callback number for questions and/or concerns.

## 2024-04-26 NOTE — Patient Instructions (Signed)
 Medication Instructions:  Continue current medications *If you need a refill on your cardiac medications before your next appointment, please call your pharmacy*  Lab Work: Bmet, cbc, lipid today If you have labs (blood work) drawn today and your tests are completely normal, you will receive your results only by: MyChart Message (if you have MyChart) OR A paper copy in the mail If you have any lab test that is abnormal or we need to change your treatment, we will call you to review the results.  Testing/Procedures: Echo  Your physician has requested that you have an echocardiogram. Echocardiography is a painless test that uses sound waves to create images of your heart. It provides your doctor with information about the size and shape of your heart and how well your heart's chambers and valves are working. This procedure takes approximately one hour. There are no restrictions for this procedure. Please do NOT wear cologne, perfume, aftershave, or lotions (deodorant is allowed). Please arrive 15 minutes prior to your appointment time.  Please note: We ask at that you not bring children with you during ultrasound (echo/ vascular) testing. Due to room size and safety concerns, children are not allowed in the ultrasound rooms during exams. Our front office staff cannot provide observation of children in our lobby area while testing is being conducted. An adult accompanying a patient to their appointment will only be allowed in the ultrasound room at the discretion of the ultrasound technician under special circumstances. We apologize for any inconvenience.   Follow-Up: At [facility], you and your health needs are our priority.  As part of our continuing mission to provide you with exceptional heart care, our providers are all part of one team.  This team includes your primary Cardiologist (physician) and Advanced Practice Providers or APPs (Physician Assistants and Nurse Practitioners)  who all work together to provide you with the care you need, when you need it.  Your next appointment:   3 month(s)  Provider:   Dr. Kate We recommend signing up for the patient portal called MyChart.  Sign up information is provided on this After Visit Summary.  MyChart is used to connect with patients for Virtual Visits (Telemedicine).  Patients are able to view lab/test results, encounter notes, upcoming appointments, etc.  Non-urgent messages can be sent to your provider as well.   To learn more about what you can do with MyChart, go to [email or web address].   Other Instructions none

## 2024-04-26 NOTE — Progress Notes (Signed)
 Order for supplies and device maintenance sent to [facility] today.

## 2024-04-27 ENCOUNTER — Other Ambulatory Visit: Payer: Self-pay

## 2024-04-27 ENCOUNTER — Telehealth: Payer: Self-pay

## 2024-04-27 ENCOUNTER — Ambulatory Visit: Payer: Self-pay | Admitting: Cardiology

## 2024-04-27 LAB — CBC WITH DIFFERENTIAL/PLATELET
Basophils Absolute: 0 10*3/uL (ref 0.0–0.2)
Basos: 0 %
EOS (ABSOLUTE): 0.1 10*3/uL (ref 0.0–0.4)
Eos: 4 %
Hematocrit: 36.2 % (ref 34.0–46.6)
Hemoglobin: 11.1 g/dL (ref 11.1–15.9)
Immature Grans (Abs): 0 10*3/uL (ref 0.0–0.1)
Immature Granulocytes: 0 %
Lymphocytes Absolute: 0.5 10*3/uL — ABNORMAL LOW (ref 0.7–3.1)
Lymphs: 16 %
MCH: 27.4 pg (ref 26.6–33.0)
MCHC: 30.7 g/dL — ABNORMAL LOW (ref 31.5–35.7)
MCV: 89 fL (ref 79–97)
Monocytes Absolute: 0.2 10*3/uL (ref 0.1–0.9)
Monocytes: 5 %
Neutrophils Absolute: 2.3 10*3/uL (ref 1.4–7.0)
Neutrophils: 75 %
Platelets: 138 10*3/uL — ABNORMAL LOW (ref 150–450)
RBC: 4.05 x10E6/uL (ref 3.77–5.28)
RDW: 18.7 % — ABNORMAL HIGH (ref 11.7–15.4)
WBC: 3 10*3/uL — ABNORMAL LOW (ref 3.4–10.8)

## 2024-04-27 LAB — BASIC METABOLIC PANEL WITH GFR
BUN/Creatinine Ratio: 19 (ref 12–28)
BUN: 28 mg/dL — ABNORMAL HIGH (ref 8–27)
CO2: 20 mmol/L (ref 20–29)
Calcium: 11.6 mg/dL — ABNORMAL HIGH (ref 8.7–10.3)
Chloride: 104 mmol/L (ref 96–106)
Creatinine, Ser: 1.5 mg/dL — ABNORMAL HIGH (ref 0.57–1.00)
Glucose: 79 mg/dL (ref 70–99)
Potassium: 3.8 mmol/L (ref 3.5–5.2)
Sodium: 139 mmol/L (ref 134–144)
eGFR: 34 mL/min/{1.73_m2} — ABNORMAL LOW (ref >59)

## 2024-04-27 LAB — LIPID PANEL
Cholesterol, Total: 102 mg/dL (ref 100–199)
HDL: 21 mg/dL — ABNORMAL LOW (ref >39)
LDL CALC COMMENT:: 4.9 ratio — ABNORMAL HIGH (ref 0.0–4.4)
LDL Chol Calc (NIH): 52 mg/dL (ref 0–99)
Triglycerides: 170 mg/dL — ABNORMAL HIGH (ref 0–149)
VLDL Cholesterol Cal: 29 mg/dL (ref 5–40)

## 2024-04-27 NOTE — Telephone Encounter (Signed)
 Spoke with pt pharmacy state that Rx should be back in stock tomorrow pt states that she only has 4 tablets left. Pt will call pharmacy later tomorrow to check on the Rx.

## 2024-04-27 NOTE — Telephone Encounter (Signed)
 Copied from CRM [id number]. Topic: Clinical - Medication Question >> Apr 27, 2024  1:03 PM [place] wrote: Reason for CRM: Pharmacy does not have megestrol  (MEGACE ) 40 MG tablet in stock, wanting to get it sent to another pharmacy

## 2024-04-28 NOTE — Telephone Encounter (Signed)
Patient was returning your call. Please advise 

## 2024-04-29 ENCOUNTER — Telehealth: Payer: Self-pay | Admitting: *Deleted

## 2024-04-29 NOTE — Telephone Encounter (Signed)
 Reason for CRM: Pharmacy does not have megestrol  (MEGACE ) 40 MG tablet in stock, wanting to get it sent to another pharmacy

## 2024-04-30 ENCOUNTER — Other Ambulatory Visit: Payer: Self-pay

## 2024-04-30 ENCOUNTER — Encounter: Payer: Self-pay | Admitting: Oncology

## 2024-04-30 MED ORDER — MEGESTROL ACETATE 40 MG PO TABS: 40.0000 mg | ORAL_TABLET | Freq: Every morning | ORAL | 5 refills | Status: DC

## 2024-04-30 NOTE — Assessment & Plan Note (Addendum)
 On her consultation with us  on 04/05/2024, calcium  was still elevated at 11.5, creatinine 1.46.  We pursued workup for monoclonal gammopathy.  SPEP showed no M spike.  IFE showed polyclonal increase.  Quantitative IgG was slightly increased at 1792 mg/dL, IgM was increased at 567 mg/dL, IgA normal.  Serum free kappa was increased at 17.1 mg/L, lambda was also increased at 51.1 mg/L, patient normal at 1.55, picture consistent with CKD.  Uric acid was normal at 4.  LDH normal.  X-ray skeletal survey on 04/05/2024 showed no evidence of bone lytic lesions.  On 04/15/2024, 24-hour urine protein electrophoresis showed no evidence of M spike.  Immunofixation appeared unremarkable.  Both free kappa and free lambda were increased in the urine, again consistent with CKD picture.   - Check vitamin D  supplement for calcium  content and discontinue if present.    On 03/19/2024 and again on 04/05/2024, SPEP showed no evidence of M spike.  IFE showed polyclonal increase.  Quantitative IgG was slightly elevated at 1593 mg/dL and IgM was increased at 445 mg/dL.  Patient has had hypercalcemia with calcium  level in the range of 10.3-11.5.     Recent CT chest, abdomen and pelvis on 03/24/2024 showed no evidence of pathologic lymphadenopathy that would be indicative of lymphoplasmacytic lymphoma or Waldenstrm's macroglobulinemia.   No clinical symptoms suggestive of hyperviscosity.  Overall workup is negative for monoclonal gammopathy.

## 2024-04-30 NOTE — Telephone Encounter (Signed)
 Spoke with pt advised that refill for MEGACE  was sent to [facility] [address]. Pt pharmacy [facility] does not have Rx in stock

## 2024-04-30 NOTE — Assessment & Plan Note (Signed)
 Persistent hypercalcemia with unknown etiology.    On her consultation with us  on 04/05/2024, calcium  was still elevated at 11.5, creatinine 1.46.  We pursued workup for monoclonal gammopathy.  SPEP showed no M spike.  IFE showed polyclonal increase.  Quantitative IgG was slightly increased at 1792 mg/dL, IgM was increased at 567 mg/dL, IgA normal.  Serum free kappa was increased at 17.1 mg/L, lambda was also increased at 51.1 mg/L, patient normal at 1.55, picture consistent with CKD.  Uric acid was normal at 4.  LDH normal.  X-ray skeletal survey on 04/05/2024 showed no evidence of bone lytic lesions.  On 04/15/2024, 24-hour urine protein electrophoresis showed no evidence of M spike.  Immunofixation appeared unremarkable.  Both free kappa and free lambda were increased in the urine, again consistent with CKD picture.   - Check vitamin D  supplement for calcium  content and discontinue if present.  Consider referral to endocrinology for further evaluation and management.  Will defer this to her PCP.

## 2024-04-30 NOTE — Telephone Encounter (Addendum)
 Spoke with pt advised that refill for megace  was sent to [facility].

## 2024-05-03 ENCOUNTER — Ambulatory Visit: Payer: Self-pay | Admitting: *Deleted

## 2024-05-03 NOTE — Telephone Encounter (Signed)
 FYI Only or Action Required?: FYI only for provider.  Patient was last seen in primary care on 03/17/2024 by Barbara [name] ORN, MD. Called Nurse Triage reporting Fatigue. Symptoms began several months ago. Interventions attempted: Rest, hydration, or home remedies. Symptoms are: unchanged.  Triage Disposition: See Physician Within 24 Hours  Patient/caregiver understands and will follow disposition?: yes   Reason for Disposition  [1] MODERATE weakness (i.e., interferes with work, school, normal activities) AND [2] persists > 3 days  Answer Assessment - Initial Assessment Questions 1. DESCRIPTION: Describe how you are feeling.     Hard to walk from one room to the next without being out of breathe 2. SEVERITY: How bad is it?  Can you stand and walk?   - MILD (0-3): Feels weak or tired, but does not interfere with work, school or normal activities.   - MODERATE (4-7): Able to stand and walk; weakness interferes with work, school, or normal activities.   - SEVERE (8-10): Unable to stand or walk; unable to do usual activities.     Moderate/severe 3. ONSET: When did these symptoms begin? (e.g., hours, days, weeks, months)     months 4. CAUSE: What do you think is causing the weakness or fatigue? (e.g., not drinking enough fluids, medical problem, trouble sleeping)     Unsure 5. NEW MEDICINES:  Have you started on any new medicines recently? (e.g., opioid pain medicines, benzodiazepines, muscle relaxants, antidepressants, antihistamines, neuroleptics, beta blockers)     Sometimes feels she is taking too much medictaions 6. OTHER SYMPTOMS: Do you have any other symptoms? (e.g., chest pain, fever, cough, SOB, vomiting, diarrhea, bleeding, other areas of pain)     no  Protocols used: Weakness (Generalized) and Fatigue-A-AH    Copied from CRM [id number]. Topic: Clinical - Red Word Triage >> May 03, 2024  8:04 AM Barbara [name] wrote: Red Word that prompted transfer to Nurse Triage: pt  says was instructed by Nurse Inocente to call in and make an appt. Pt states feels weaker and weaker.

## 2024-05-03 NOTE — Telephone Encounter (Signed)
 Noted

## 2024-05-04 ENCOUNTER — Encounter: Payer: Self-pay | Admitting: Family Medicine

## 2024-05-04 ENCOUNTER — Ambulatory Visit: Admitting: Family Medicine

## 2024-05-04 VITALS — BP 124/60 | HR 85 | Temp 97.9°F | Wt 119.0 lb

## 2024-05-04 DIAGNOSIS — R630 Anorexia: Secondary | ICD-10-CM

## 2024-05-04 DIAGNOSIS — N1832 Chronic kidney disease, stage 3b: Secondary | ICD-10-CM

## 2024-05-04 DIAGNOSIS — D649 Anemia, unspecified: Secondary | ICD-10-CM | POA: Diagnosis not present

## 2024-05-04 DIAGNOSIS — H04123 Dry eye syndrome of bilateral lacrimal glands: Secondary | ICD-10-CM | POA: Diagnosis not present

## 2024-05-04 DIAGNOSIS — R768 Other specified abnormal immunological findings in serum: Secondary | ICD-10-CM | POA: Diagnosis not present

## 2024-05-04 DIAGNOSIS — G4733 Obstructive sleep apnea (adult) (pediatric): Secondary | ICD-10-CM | POA: Diagnosis not present

## 2024-05-04 DIAGNOSIS — R634 Abnormal weight loss: Secondary | ICD-10-CM | POA: Diagnosis not present

## 2024-05-04 DIAGNOSIS — H16223 Keratoconjunctivitis sicca, not specified as Sjogren's, bilateral: Secondary | ICD-10-CM | POA: Diagnosis not present

## 2024-05-04 MED ORDER — MIRTAZAPINE 15 MG PO TABS: 15.0000 mg | ORAL_TABLET | Freq: Every day | ORAL | 5 refills | Status: AC

## 2024-05-04 NOTE — Addendum Note (Signed)
 Addended by: LADONNA INOCENTE SAILOR on: 05/04/2024 03:51 PM   Modules accepted: Orders

## 2024-05-04 NOTE — Progress Notes (Signed)
   Subjective:    Patient ID: Barbara [name], female    DOB: 30-Jun-1941, 83 y.o.   MRN: [id number]  HPI Here with her sister-in-law to follow up on fatigue, anorexia, and weight loss. At our last visit we started her on Megestrol  daily. She thinks this has helped her appetitie but not much. She still has little desire to eat food. She drinks several Boost shakes a day. She saw Dr. Chinita Pasam in Oncology for her elevated IgM, pancytopenia, and hypercalcemia. Her last labs on 04-26-24 showed WBC 3.0, Hgb 11.1, platelets 138, and creatinine 1.50. She had CT scans of the chest, abdomen, and pelvis on 03-24-24, and these were unremarkable. She had a UPEP on 04-26-24 showing elevated kappa and lambda light chains but no monoclonal M spikes. She had a bone survey on 04-05-24 that showed no bony lesions. Her weight continues to go down from 134 on 03-09-24 to 130 on 04-05-24 to 119 today. She had not been using her CPAP machine because she has had difficulty operating it. This had ben managed by her cardiologist, Dr. Burnard, but he has retired and her newly assigned cardiologist does not manage sleep apnea.    Review of Systems  Constitutional:  Positive for appetite change and unexpected weight change.  Respiratory: Negative.    Cardiovascular: Negative.   Gastrointestinal: Negative.   Genitourinary: Negative.   Neurological: Negative.        Objective:   Physical Exam Constitutional:      Comments: Thin, in a wheelchair    Cardiovascular:     Rate and Rhythm: Normal rate and regular rhythm.     Pulses: Normal pulses.     Heart sounds: Normal heart sounds.  Pulmonary:     Effort: Pulmonary effort is normal.     Breath sounds: Normal breath sounds.  Abdominal:     General: Abdomen is flat. There is no distension.     Palpations: Abdomen is soft. There is no mass.     Tenderness: There is no abdominal tenderness. There is no guarding or rebound.     Hernia: No hernia is present.    Neurological:     Mental Status: She is alert and oriented to person, place, and time.   Psychiatric:        Mood and Affect: Mood normal.           Assessment & Plan:  She is having fatigue and weight loss, and most of this is likely due to poor nutrition. 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 We will add Mirtazepine 15 mg at bedtime to try to stimulate her appetite. We will refer her to Pulmonary to manage her sleep apnea. We will also refer to home health to assist with ADL's and food preparation. We spent a total of (33   ) minutes reviewing records and discussing these issues.  Garnette Olmsted, MD

## 2024-05-05 ENCOUNTER — Telehealth: Payer: Self-pay

## 2024-05-05 ENCOUNTER — Ambulatory Visit: Admitting: Podiatry

## 2024-05-05 DIAGNOSIS — Z91198 Patient's noncompliance with other medical treatment and regimen for other reason: Secondary | ICD-10-CM

## 2024-05-05 NOTE — Telephone Encounter (Signed)
 Copied from CRM [id number]. Topic: Referral - Status >> May 05, 2024 11:21 AM Geroldine GRADE wrote: Reason for CRM: Olam from [facility] is calling because they have received a referral for this patient. She stated they cannot accept this patient because they are located in [place] and do not come out to [place] for services  They can be reached at [phone] if there are any questions

## 2024-05-06 ENCOUNTER — Other Ambulatory Visit: Payer: Self-pay

## 2024-05-06 DIAGNOSIS — R634 Abnormal weight loss: Secondary | ICD-10-CM

## 2024-05-06 DIAGNOSIS — R630 Anorexia: Secondary | ICD-10-CM

## 2024-05-06 NOTE — Progress Notes (Signed)
 1. Failure to attend appointment with reason given    Rescheduled appointment.

## 2024-05-06 NOTE — Telephone Encounter (Signed)
 Pr referral was changed to Social Work and office referral coordinator notified for f/u

## 2024-05-08 ENCOUNTER — Encounter (HOSPITAL_COMMUNITY): Payer: Self-pay

## 2024-05-08 ENCOUNTER — Emergency Department (HOSPITAL_COMMUNITY)

## 2024-05-08 ENCOUNTER — Emergency Department (HOSPITAL_COMMUNITY)
Admission: EM | Admit: 2024-05-08 | Discharge: 2024-05-08 | Disposition: A | Discharge status: Home / Self Care | Attending: Emergency Medicine | Admitting: Emergency Medicine

## 2024-05-08 ENCOUNTER — Other Ambulatory Visit: Payer: Self-pay

## 2024-05-08 DIAGNOSIS — Z7901 Long term (current) use of anticoagulants: Secondary | ICD-10-CM | POA: Insufficient documentation

## 2024-05-08 DIAGNOSIS — M791 Myalgia, unspecified site: Secondary | ICD-10-CM | POA: Insufficient documentation

## 2024-05-08 DIAGNOSIS — R29818 Other symptoms and signs involving the nervous system: Secondary | ICD-10-CM | POA: Diagnosis not present

## 2024-05-08 DIAGNOSIS — R531 Weakness: Secondary | ICD-10-CM | POA: Diagnosis not present

## 2024-05-08 LAB — CBC
HCT: 37.1 % (ref 36.0–46.0)
Hemoglobin: 11.9 g/dL — ABNORMAL LOW (ref 12.0–15.0)
MCH: 28.1 pg (ref 26.0–34.0)
MCHC: 32.1 g/dL (ref 30.0–36.0)
MCV: 87.7 fL (ref 80.0–100.0)
Platelets: 153 K/uL (ref 150–400)
RBC: 4.23 MIL/uL (ref 3.87–5.11)
RDW: 20.7 % — ABNORMAL HIGH (ref 11.5–15.5)
WBC: 3.4 K/uL — ABNORMAL LOW (ref 4.0–10.5)
nRBC: 0 % (ref 0.0–0.2)

## 2024-05-08 LAB — COMPREHENSIVE METABOLIC PANEL WITH GFR
ALT: 20 U/L (ref 0–44)
AST: 48 U/L — ABNORMAL HIGH (ref 15–41)
Albumin: 2.8 g/dL — ABNORMAL LOW (ref 3.5–5.0)
Alkaline Phosphatase: 66 U/L (ref 38–126)
Anion gap: 7 (ref 5–15)
BUN: 26 mg/dL — ABNORMAL HIGH (ref 8–23)
CO2: 25 mmol/L (ref 22–32)
Calcium: 11.8 mg/dL — ABNORMAL HIGH (ref 8.9–10.3)
Chloride: 107 mmol/L (ref 98–111)
Creatinine, Ser: 1.63 mg/dL — ABNORMAL HIGH (ref 0.44–1.00)
GFR, Estimated: 31 mL/min — ABNORMAL LOW (ref >60)
Glucose, Bld: 136 mg/dL — ABNORMAL HIGH (ref 70–99)
Potassium: 3.3 mmol/L — ABNORMAL LOW (ref 3.5–5.1)
Sodium: 139 mmol/L (ref 135–145)
Total Bilirubin: 0.5 mg/dL (ref 0.0–1.2)
Total Protein: 7.9 g/dL (ref 6.5–8.1)

## 2024-05-08 LAB — DIFFERENTIAL
Abs Immature Granulocytes: 0 K/uL (ref 0.00–0.07)
Basophils Absolute: 0 K/uL (ref 0.0–0.1)
Basophils Relative: 0 %
Eosinophils Absolute: 0 K/uL (ref 0.0–0.5)
Eosinophils Relative: 1 %
Lymphocytes Relative: 13 %
Lymphs Abs: 0.4 K/uL — ABNORMAL LOW (ref 0.7–4.0)
Monocytes Absolute: 0.1 K/uL (ref 0.1–1.0)
Monocytes Relative: 2 %
Neutro Abs: 2.9 K/uL (ref 1.7–7.7)
Neutrophils Relative %: 84 %
nRBC: 0 /100{WBCs}

## 2024-05-08 LAB — MAGNESIUM: Magnesium: 1.9 mg/dL (ref 1.7–2.4)

## 2024-05-08 LAB — APTT: aPTT: 68 s — ABNORMAL HIGH (ref 24–36)

## 2024-05-08 LAB — CBG MONITORING, ED: Glucose-Capillary: 132 mg/dL — ABNORMAL HIGH (ref 70–99)

## 2024-05-08 LAB — ETHANOL: Alcohol, Ethyl (B): <15 mg/dL (ref <15)

## 2024-05-08 LAB — PROTIME-INR
INR: 1.2 (ref 0.8–1.2)
Prothrombin Time: 16.2 s — ABNORMAL HIGH (ref 11.4–15.2)

## 2024-05-08 MED ORDER — SODIUM CHLORIDE 0.9% FLUSH: 3.0000 mL | Freq: Once | INTRAVENOUS | Status: DC

## 2024-05-08 MED ORDER — POTASSIUM CHLORIDE 10 MEQ/100ML IV SOLN
10.0000 meq | Freq: Once | INTRAVENOUS | Status: AC
  Administered 2024-05-08: 10 meq via INTRAVENOUS
  Filled 2024-05-08: qty 100

## 2024-05-08 MED ORDER — SODIUM CHLORIDE 0.9 % IV BOLUS
1000.0000 mL | Freq: Once | INTRAVENOUS | Status: AC
Start: 2024-05-08 — End: 2024-05-08
  Administered 2024-05-08: 1000 mL via INTRAVENOUS

## 2024-05-08 NOTE — ED Triage Notes (Signed)
 Pt has multiple complaints: Pt c.o right foot pain, bilateral knee pain and left shoulder pain. Pt states she has been losing a lot of weight recently, unintentionally.pt also says her son said she was slurring her words yesterday. Speech does not seemed slurred at this time

## 2024-05-08 NOTE — ED Provider Notes (Signed)
 [place] EMERGENCY DEPARTMENT AT [facility] Provider Note   CSN: [id number] Arrival date & time: 05/08/24  1159     Patient presents with: Foot Pain, Knee Pain, Weakness, and Aphasia   Barbara [name] is a 83 y.o. female presenting to ED with concern for chronic pain and failure to thrive. Her son is concerned patient lives alone and normally independently but has been very fatigued, eating little, losing weight and having a hard time at home.   He is visiting from [place] to help her.  She reports left shoulder pain and left knee pain.  She had a bone surgery on 04/05/24 with no abnormal finding.  Her son is concerned she is on too many medications.  She was weaned off ativan  1-2 months ago.  She takes imdur , remeron  15 mg daily, crestor , hydralazine , jardiance , eliquis   Seen by PCP Dr Garnette Olmsted 4 days ago who is helping arrange home health services- he started the remeron  15 mg at bedtime to try to stimulate her appetite.   HPI     Prior to Admission medications   Medication Sig Start Date End Date Taking? Authorizing Provider  acetaminophen  (TYLENOL ) 500 MG tablet Take 500 mg by mouth daily as needed for headache.    [provider]  alum & mag hydroxide-simeth (MAALOX/MYLANTA) 200-200-20 MG/5ML suspension Take 30 mLs by mouth every 4 (four) hours as needed for indigestion. 09/30/23   Leotis Bogus, MD  apixaban  (ELIQUIS ) 2.5 MG TABS tablet Take 1 tablet (2.5 mg total) by mouth 2 (two) times daily. 01/21/24   Olmsted Garnette LABOR, MD  Cholecalciferol  (VITAMIN D3) 2000 units TABS Take 2,000 Units by mouth in the morning.    [provider]  empagliflozin  (JARDIANCE ) 10 MG TABS tablet Take 1 tablet (10 mg total) by mouth daily. 01/21/24   Olmsted Garnette LABOR, MD  hydrALAZINE  (APRESOLINE ) 100 MG tablet Take 1.5 tablets (150 mg total) by mouth 2 (two) times daily. 12/26/23   Olmsted Garnette LABOR, MD  ipratropium (ATROVENT ) 0.06 % nasal spray Place 2 sprays into both  nostrils 2 (two) times daily as needed (allergies). 04/28/18   [provider]  isosorbide  mononitrate (IMDUR ) 30 MG 24 hr tablet Take 1 tablet (30 mg total) by mouth daily. 02/24/24   Olmsted Garnette LABOR, MD  ketoconazole  (NIZORAL ) 2 % cream Apply 1 Application topically 2 (two) times daily as needed for irritation. 12/26/23   Olmsted Garnette LABOR, MD  loratadine  (CLARITIN ) 10 MG tablet Take 10 mg by mouth daily as needed for allergies.    [provider]  LORazepam  (ATIVAN ) 0.5 MG tablet TAKE 1 TABLET BY MOUTH EVERY 6 HOURS AS NEEDED FOR ANXIETY 12/23/23   Olmsted Garnette LABOR, MD  megestrol  (MEGACE ) 40 MG tablet Take 1 tablet (40 mg total) by mouth in the morning. 04/30/24   Olmsted Garnette LABOR, MD  metoprolol  succinate (TOPROL -XL) 25 MG 24 hr tablet Take 1 tablet (25 mg total) by mouth daily. Take with or immediately following a meal. 03/11/24   Gonfa, Taye T, MD  mirtazapine  (REMERON ) 15 MG tablet Take 1 tablet (15 mg total) by mouth at bedtime. 05/04/24   Olmsted Garnette LABOR, MD  nitroGLYCERIN  (NITROSTAT ) 0.4 MG SL tablet Place 1 tablet (0.4 mg total) under the tongue every 5 (five) minutes as needed for chest pain. 04/06/24   Olmsted Garnette LABOR, MD  pantoprazole  (PROTONIX ) 40 MG tablet Take 1 tablet (40 mg total) by mouth daily. Patient taking differently: Take 40  mg by mouth as needed. 02/13/24 02/13/25  Johnny Garnette LABOR, MD  Probiotic Product (PROBIOTIC GUMMIES PO) Take 5 each by mouth every morning. Align Probiotic    [provider]  rosuvastatin  (CRESTOR ) 40 MG tablet Take 1 tablet by mouth once daily Patient taking differently: Take 40 mg by mouth at bedtime. 08/22/23   Burnard Debby LABOR, MD  triamcinolone  (NASACORT) 55 MCG/ACT nasal inhaler Place 1 spray into both nostrils daily as needed (for allergies).  10/17/12   Johnny Garnette LABOR, MD    Allergies: Lidocaine , Morphine , Procaine hcl, Sulfonamide derivatives, Amlodipine , Norco [hydrocodone -acetaminophen ], Tramadol , Vytorin [ezetimibe-simvastatin], and Tape     Review of Systems  Updated Vital Signs BP (!) 141/48   Pulse 73   Temp (!) 97.4 F (36.3 C) (Oral)   Resp 19   SpO2 100%   Physical Exam Constitutional:      General: She is not in acute distress. HENT:     Head: Normocephalic and atraumatic.  Eyes:     Conjunctiva/sclera: Conjunctivae normal.     Pupils: Pupils are equal, round, and reactive to light.  Cardiovascular:     Rate and Rhythm: Normal rate and regular rhythm.  Pulmonary:     Effort: Pulmonary effort is normal. No respiratory distress.  Abdominal:     General: There is no distension.     Tenderness: There is no abdominal tenderness.  Musculoskeletal:     Comments: Limited ROM left shoulder due to pain (able to extend to 90 degrees but no active or passive overhead arm raise); limited ROM of bilateral knees, hips with normal ROM  Skin:    General: Skin is warm and dry.  Neurological:     General: No focal deficit present.     Mental Status: She is alert. Mental status is at baseline.  Psychiatric:        Mood and Affect: Mood normal.        Behavior: Behavior normal.     (all labs ordered are listed, but only abnormal results are displayed) Labs Reviewed  PROTIME-INR - Abnormal; Notable for the following components:      Result Value   Prothrombin Time 16.2 (*)    All other components within normal limits  APTT - Abnormal; Notable for the following components:   aPTT 68 (*)    All other components within normal limits  CBC - Abnormal; Notable for the following components:   WBC 3.4 (*)    Hemoglobin 11.9 (*)    RDW 20.7 (*)    All other components within normal limits  DIFFERENTIAL - Abnormal; Notable for the following components:   Lymphs Abs 0.4 (*)    All other components within normal limits  COMPREHENSIVE METABOLIC PANEL WITH GFR - Abnormal; Notable for the following components:   Potassium 3.3 (*)    Glucose, Bld 136 (*)    BUN 26 (*)    Creatinine, Ser 1.63 (*)    Calcium  11.8 (*)     Albumin 2.8 (*)    AST 48 (*)    GFR, Estimated 31 (*)    All other components within normal limits  CBG MONITORING, ED - Abnormal; Notable for the following components:   Glucose-Capillary 132 (*)    All other components within normal limits  ETHANOL  MAGNESIUM     EKG: None  Radiology: CT HEAD WO CONTRAST Result Date: 05/08/2024 CLINICAL DATA:  Neuro deficit, concern for stroke, slurred speech, unintentional weight loss. EXAM: CT HEAD WITHOUT CONTRAST  TECHNIQUE: Contiguous axial images were obtained from the base of the skull through the vertex without intravenous contrast. RADIATION DOSE REDUCTION: This exam was performed according to the departmental dose-optimization program which includes automated exposure control, adjustment of the mA and/or kV according to patient size and/or use of iterative reconstruction technique. COMPARISON:  None Available. FINDINGS: Brain: No acute intracranial hemorrhage. No CT evidence of acute infarct. No edema, mass effect, or midline shift. The basilar cisterns are patent. Ventricles: The ventricles are normal. Vascular: Atherosclerotic calcifications of the carotid siphons. No hyperdense vessel. Skull: No acute or aggressive finding. Orbits: Left lens replacement.  Orbits are otherwise unremarkable. Sinuses: Mild mucosal thickening in the ethmoid sinuses. Other: Trace fluid in the right mastoid tip. IMPRESSION: No CT evidence of acute intracranial abnormality. Electronically Signed   By: Donnice Mania M.D.   On: 05/08/2024 13:13     Procedures   Medications Ordered in the ED  sodium chloride  flush (NS) 0.9 % injection 3 mL (3 mLs Intravenous Not Given 05/08/24 1414)  sodium chloride  0.9 % bolus 1,000 mL (0 mLs Intravenous Stopped 05/08/24 1516)  potassium chloride  10 mEq in 100 mL IVPB (0 mEq Intravenous Stopped 05/08/24 1516)                                    Medical Decision Making Amount and/or Complexity of Data Reviewed Labs: ordered. Radiology:  ordered.  Risk Prescription drug management.   This patient presents to the ED with concern for weakness, fatigue, weight loss, joint pain. This involves an extensive number of treatment options, and is a complaint that carries with it a high risk of complications and morbidity.  The differential diagnosis includes failure to thrive/anorexia vs dehydration vs MSK pain vs arthritis vs medication side effects vs other  Co-morbidities that complicate the patient evaluation: advanced age  Additional history obtained from son  External records from outside source obtained and reviewed including PCP office note and recent bone survey  I ordered and personally interpreted labs.  The pertinent results include:  low K, low alb, mild increase in Cr  I ordered imaging studies including CT head I independently visualized and interpreted imaging which showed no acute stroke I agree with the radiologist interpretation  The patient was maintained on a cardiac monitor.  I personally viewed and interpreted the cardiac monitored which showed an underlying rhythm of: NSR  I ordered medication including Iv fluid and IV k   I have reviewed the patients home medicines and have made adjustments as needed  Test Considered: doubt sepsis, acute PE, intraabdominal emergency  After the interventions noted above, I reevaluated the patient and found that they have: improved  My overall clinical impression is that the patient's symptoms are multifaceted.  She has joint pain and arthalgia's on her left shoulder and left knee that limit her mobility at home.  Her son says she has home PT coming out and she tells me she can do basic functions and ambulate to the bathroom.  I also think there may be a depression component as she lives alone, her husband died 2 years ago.  This may be contributing to her lack of interest in eating.  She is adament about wanting to stay at home and not move to a nursing facility; her son  lives in [place], but she has a nephew or another family member locally who has been getting her  groceries.  Their PCP also set up home health this week. This may be the best we can do in the short-term.  There is no emergent indication for hospitalization at this time.  Dispostion:  After consideration of the diagnostic results and the patients response to treatment, I feel that the patent would benefit from close outpatient follow up.      Final diagnoses:  Weakness  Myalgia    ED Discharge Orders     None          Cottie Donnice PARAS, MD 05/08/24 [phone]

## 2024-05-08 NOTE — ED Notes (Signed)
 Patient transported to CT

## 2024-05-10 ENCOUNTER — Ambulatory Visit (INDEPENDENT_AMBULATORY_CARE_PROVIDER_SITE_OTHER): Payer: Medicare Other

## 2024-05-10 ENCOUNTER — Telehealth: Payer: Self-pay | Admitting: *Deleted

## 2024-05-10 DIAGNOSIS — I480 Paroxysmal atrial fibrillation: Secondary | ICD-10-CM | POA: Diagnosis not present

## 2024-05-10 NOTE — Progress Notes (Unsigned)
 Complex Care Management Note Care Guide Note  05/10/2024 Name: Barbara [name] MRN: [id number] DOB: Sep 07, 1941   Complex Care Management Outreach Attempts: An unsuccessful telephone outreach was attempted today to offer the patient information about available complex care management services.  Follow Up Plan:  Additional outreach attempts will be made to offer the patient complex care management information and services.   Encounter Outcome:  No Answer  Thedford Franks, CMA [place]  [facility], [facility] Guide Direct Dial: [phone]  Fax: [phone] Website: [email or web address]

## 2024-05-11 LAB — CUP PACEART REMOTE DEVICE CHECK
Battery Remaining Longevity: 141 mo
Battery Voltage: 3.04 V
Brady Statistic AP VP Percent: 0.45 %
Brady Statistic AP VS Percent: 73.34 %
Brady Statistic AS VP Percent: 0.02 %
Brady Statistic AS VS Percent: 26.19 %
Brady Statistic RA Percent Paced: 73.77 %
Brady Statistic RV Percent Paced: 0.48 %
Date Time Interrogation Session: 20250707015215
Implantable Lead Connection Status: 753985
Implantable Lead Connection Status: 753985
Implantable Lead Implant Date: 20230705
Implantable Lead Implant Date: 20230705
Implantable Lead Location: 753859
Implantable Lead Location: 753860
Implantable Lead Model: 3830
Implantable Lead Model: 5076
Implantable Pulse Generator Implant Date: 20230705
Lead Channel Impedance Value: 323 Ohm
Lead Channel Impedance Value: 361 Ohm
Lead Channel Impedance Value: 437 Ohm
Lead Channel Impedance Value: 456 Ohm
Lead Channel Pacing Threshold Amplitude: 0.625 V
Lead Channel Pacing Threshold Amplitude: 1.125 V
Lead Channel Pacing Threshold Pulse Width: 0.4 ms
Lead Channel Pacing Threshold Pulse Width: 0.4 ms
Lead Channel Sensing Intrinsic Amplitude: 12.875 mV
Lead Channel Sensing Intrinsic Amplitude: 12.875 mV
Lead Channel Sensing Intrinsic Amplitude: 2.75 mV
Lead Channel Sensing Intrinsic Amplitude: 2.75 mV
Lead Channel Setting Pacing Amplitude: 1.5 V
Lead Channel Setting Pacing Amplitude: 2.25 V
Lead Channel Setting Pacing Pulse Width: 0.4 ms
Lead Channel Setting Sensing Sensitivity: 0.9 mV
Zone Setting Status: 755011
Zone Setting Status: 755011

## 2024-05-11 NOTE — Progress Notes (Signed)
 Complex Care Management Note  Care Guide Note 05/11/2024 Name: Barbara [name] MRN: [id number] DOB: [birth year]  Barbara [name] is a 83 y.o. year old female who sees Johnny Garnette LABOR, MD for primary care. I reached out to Charolette CHRISTELLA Crimes by phone today to offer complex care management services.  [name] was given information about Complex Care Management services today including:   The Complex Care Management services include support from the care team which includes your Nurse Care Manager, Clinical Social Worker, or Pharmacist.  The Complex Care Management team is here to help remove barriers to the health concerns and goals most important to you. Complex Care Management services are voluntary, and the patient may decline or stop services at any time by request to their care team member.   Complex Care Management Consent Status: Patient agreed to services and verbal consent obtained.   Follow up plan:  Telephone appointment with complex care management team member scheduled for:  05/18/2024  Encounter Outcome:  Patient Scheduled  Thedford Franks, CMA, [place]  [facility], [facility] Guide Direct Dial: [phone]  Fax: [phone] Website: [email or web address]

## 2024-05-12 ENCOUNTER — Other Ambulatory Visit: Payer: Self-pay | Admitting: Family Medicine

## 2024-05-12 ENCOUNTER — Encounter: Payer: Self-pay | Admitting: Family Medicine

## 2024-05-12 ENCOUNTER — Telehealth: Payer: Self-pay

## 2024-05-12 DIAGNOSIS — R634 Abnormal weight loss: Secondary | ICD-10-CM

## 2024-05-12 DIAGNOSIS — R630 Anorexia: Secondary | ICD-10-CM

## 2024-05-12 NOTE — Progress Notes (Signed)
 Referral placed with SN

## 2024-05-12 NOTE — Telephone Encounter (Signed)
Spoke with case manager.

## 2024-05-12 NOTE — Telephone Encounter (Signed)
 Copied from CRM [id number]. Topic: General - Other >> May 12, 2024 10:56 AM Larissa RAMAN wrote: Reason for CRM: Jacequlyn, RN case manager with [employer] calling to inquire/confirm if home health services were set up for patient after recent ED admission on 05/08/2024. Requesting a callback at [phone].

## 2024-05-13 ENCOUNTER — Telehealth: Payer: Self-pay

## 2024-05-13 NOTE — Transitions of Care (Post Inpatient/ED Visit) (Signed)
 05/13/2024  Name: Barbara [name] MRN: [id number] DOB: Jun 21, 1941  Today's TOC FU Call Status: Today's TOC FU Call Status:: Successful TOC FU Call Completed TOC FU Call Complete Date: 05/13/24 Patient's Name and Date of Birth confirmed.  Transition Care Management Follow-up Telephone Call Date of Discharge: 05/08/24 Discharge Facility: [facility]) Type of Discharge: Emergency Department Reason for ED Visit: Other: How have you been since you were released from the hospital?: Same Any questions or concerns?: No  Items Reviewed: Did you receive and understand the discharge instructions provided?: Yes Medications obtained,verified, and reconciled?: Yes (Medications Reviewed) Any new allergies since your discharge?: No Dietary orders reviewed?: No Do you have support at home?: Yes People in Home [RPT]: sibling(s) Name of Support/Comfort Primary Source: ardelia  Medications Reviewed Today: Medications Reviewed Today     Reviewed by Barbara [name], CMA (Certified Medical Assistant) on 05/13/24 at 1125  Med List Status: <None>   Medication Order Taking? Sig Documenting Provider Last Dose Status Informant  acetaminophen  (TYLENOL ) 500 MG tablet [id number] Yes Take 500 mg by mouth daily as needed for headache. [provider]  Active Self  alum & mag hydroxide-simeth (MAALOX/MYLANTA) 200-200-20 MG/5ML suspension [id number] Yes Take 30 mLs by mouth every 4 (four) hours as needed for indigestion. Barbara Bogus, MD  Active Self  apixaban  (ELIQUIS ) 2.5 MG TABS tablet [id number] Yes Take 1 tablet (2.5 mg total) by mouth 2 (two) times daily. Barbara Garnette LABOR, MD  Active Self  Cholecalciferol  (VITAMIN D3) 2000 units TABS [id number] Yes Take 2,000 Units by mouth in the morning. [provider]  Active Self  empagliflozin  (JARDIANCE ) 10 MG TABS tablet [id number] Yes Take 1 tablet (10 mg total) by mouth daily. Barbara Garnette LABOR, MD  Active Self  hydrALAZINE  (APRESOLINE ) 100  MG tablet [id number] Yes Take 1.5 tablets (150 mg total) by mouth 2 (two) times daily. Barbara Garnette LABOR, MD  Active Self  ipratropium (ATROVENT ) 0.06 % nasal spray [id number] Yes Place 2 sprays into both nostrils 2 (two) times daily as needed (allergies). [provider]  Active Self  isosorbide  mononitrate (IMDUR ) 30 MG 24 hr tablet [id number] Yes Take 1 tablet (30 mg total) by mouth daily. Barbara Garnette LABOR, MD  Active Self  ketoconazole  (NIZORAL ) 2 % cream [id number] Yes Apply 1 Application topically 2 (two) times daily as needed for irritation. Barbara Garnette LABOR, MD  Active Self  loratadine  (CLARITIN ) 10 MG tablet [id number] Yes Take 10 mg by mouth daily as needed for allergies. [provider]  Active Self  LORazepam  (ATIVAN ) 0.5 MG tablet [id number] Yes TAKE 1 TABLET BY MOUTH EVERY 6 HOURS AS NEEDED FOR ANXIETY Barbara Garnette LABOR, MD  Active Self  megestrol  (MEGACE ) 40 MG tablet [id number] Yes Take 1 tablet (40 mg total) by mouth in the morning. Barbara Garnette LABOR, MD  Active   metoprolol  succinate (TOPROL -XL) 25 MG 24 hr tablet [id number] Yes Take 1 tablet (25 mg total) by mouth daily. Take with or immediately following a meal. Gonfa, Taye T, MD  Active   mirtazapine  (REMERON ) 15 MG tablet [id number] Yes Take 1 tablet (15 mg total) by mouth at bedtime. Barbara Garnette LABOR, MD  Active   nitroGLYCERIN  (NITROSTAT ) 0.4 MG SL tablet [id number] Yes Place 1 tablet (0.4 mg total) under the tongue every 5 (five) minutes as needed for chest pain. Barbara Garnette LABOR, MD  Active   pantoprazole  (PROTONIX ) 40 MG tablet [id number] Yes Take 1 tablet (40 mg total) by mouth daily.  Barbara Garnette LABOR, MD  Active Self  Probiotic Product (PROBIOTIC GUMMIES PO) [id number] Yes Take 5 each by mouth every morning. Align Probiotic [provider]  Active Self  rosuvastatin  (CRESTOR ) 40 MG tablet [id number] Yes Take 1 tablet by mouth once daily  Patient taking differently: Take 40 mg by mouth at bedtime.   Barbara [name] LABOR, MD  Active  Self  triamcinolone  (NASACORT) 55 MCG/ACT nasal inhaler [id number] Yes Place 1 spray into both nostrils daily as needed (for allergies).  Barbara Garnette LABOR, MD  Active Self           Med Note Barbara [name]   Tue Mar 09, 2024  6:11 PM)              Home Care and Equipment/Supplies: Were Home Health Services Ordered?: Yes Name of Home Health Agency:: na Has Agency set up a time to come to your home?: No EMR reviewed for Home Health Orders: Orders present/patient has not received call (refer to CM for follow-up) Any new equipment or medical supplies ordered?: NA  Functional Questionnaire: Do you need assistance with bathing/showering or dressing?: No Do you need assistance with meal preparation?: No Do you need assistance with eating?: No Do you have difficulty maintaining continence: No Do you need assistance with getting out of bed/getting out of a chair/moving?: No Do you have difficulty managing or taking your medications?: No  Follow up appointments reviewed: PCP Follow-up appointment confirmed?: Yes Date of PCP follow-up appointment?: 05/14/24 Follow-up Provider: [facility] Follow-up appointment confirmed?: NA Do you need transportation to your follow-up appointment?: No Do you understand care options if your condition(s) worsen?: Yes-patient verbalized understanding    SIGNATURE Barbara [name],CMA

## 2024-05-14 ENCOUNTER — Telehealth: Admitting: Family Medicine

## 2024-05-16 DIAGNOSIS — G4733 Obstructive sleep apnea (adult) (pediatric): Secondary | ICD-10-CM | POA: Diagnosis not present

## 2024-05-17 ENCOUNTER — Encounter: Payer: Self-pay | Admitting: Family Medicine

## 2024-05-17 ENCOUNTER — Ambulatory Visit (INDEPENDENT_AMBULATORY_CARE_PROVIDER_SITE_OTHER): Admitting: Family Medicine

## 2024-05-17 VITALS — BP 136/62 | HR 87 | Wt 124.4 lb

## 2024-05-17 DIAGNOSIS — R634 Abnormal weight loss: Secondary | ICD-10-CM

## 2024-05-17 DIAGNOSIS — N1832 Chronic kidney disease, stage 3b: Secondary | ICD-10-CM

## 2024-05-17 DIAGNOSIS — I5032 Chronic diastolic (congestive) heart failure: Secondary | ICD-10-CM | POA: Diagnosis not present

## 2024-05-17 DIAGNOSIS — R630 Anorexia: Secondary | ICD-10-CM

## 2024-05-17 DIAGNOSIS — R627 Adult failure to thrive: Secondary | ICD-10-CM | POA: Insufficient documentation

## 2024-05-17 DIAGNOSIS — D649 Anemia, unspecified: Secondary | ICD-10-CM | POA: Diagnosis not present

## 2024-05-17 DIAGNOSIS — I10 Essential (primary) hypertension: Secondary | ICD-10-CM | POA: Diagnosis not present

## 2024-05-17 DIAGNOSIS — G4733 Obstructive sleep apnea (adult) (pediatric): Secondary | ICD-10-CM

## 2024-05-17 DIAGNOSIS — I4819 Other persistent atrial fibrillation: Secondary | ICD-10-CM

## 2024-05-17 NOTE — Progress Notes (Signed)
 Subjective:    Patient ID: Barbara [name], female    DOB: 1941-10-27, 83 y.o.   MRN: [id number]  HPI Here with a friend to follow up on weakness, muscle aches, and poor appetite. We have seen her several times in the past 2 months, and she continues to decline. She is taking Megestrol  in the mornings and Mirtazepine in the evenings, but she she has no appetite at all. She tries to drink fluids and protein shakes, but she gets very little down because it hurts to swallow. She sees Oncology and she has had an elevated total serum IgM. There have been concerns she could have multiple myeloma, but a UPEP on 04-15-24 and a bone survey on 04-05-24 were both negative for this. She has CAD and CHF which seems to be stable. She has atrial fibrillation which seems to be stable. Her CKD has been stable. She had an ED visit on 05-08-24 where her labs showed a WBC 6.7, Hgb 8.3, creatinine 0.66, and calcium  11.8. Her calcium  levels have been slowly going up with no clear etiology. Her PTH related peptide level on 03-26-24 was normal at <2.0. Albumen was low at 2.8. Her head CT was unremarkable. We also spoke to her son (who lives in [place]) over the phone today, and he is very concerned about her declining health.    Review of Systems  Constitutional:  Positive for appetite change, fatigue and unexpected weight change.  Respiratory:  Positive for shortness of breath. Negative for cough, chest tightness and wheezing.   Cardiovascular:  Positive for leg swelling. Negative for chest pain and palpitations.  Gastrointestinal: Negative.   Genitourinary: Negative.   Musculoskeletal:  Positive for arthralgias and myalgias.  Neurological:  Positive for weakness. Negative for numbness.       Objective:   Physical Exam Constitutional:      Comments: She is quite weak and frail. In a wheelchair   Cardiovascular:     Rate and Rhythm: Normal rate. Rhythm irregular.     Pulses: Normal pulses.     Heart sounds:  Normal heart sounds.  Pulmonary:     Effort: Pulmonary effort is normal. No respiratory distress.     Breath sounds: Normal breath sounds. No wheezing, rhonchi or rales.  Abdominal:     General: Abdomen is flat. Bowel sounds are normal. There is no distension.     Palpations: Abdomen is soft. There is no mass.     Tenderness: There is no abdominal tenderness. There is no right CVA tenderness, left CVA tenderness, guarding or rebound.     Hernia: No hernia is present.  Musculoskeletal:     Comments: 1+ edema in both ankles   Neurological:     Mental Status: She is alert and oriented to person, place, and time.           Assessment & Plan:  Failure to thrive with steadily worsening weakness, poor appetite, and muscle pains. We agreed to stop the Rosuvastatin  in case this could be causing side effects. The only specific possible etiology that I can see would be the hypercalcemia, although the levels are not exceedingly high. I think she needs to be admitted to the hospital for IV fluids, for better nutrition (possibly with the aid of an NG tube), and for further testing. The hypercalcemia could be treated with Calcitonin, etc. Once stable she would be a good candidate for a rehab stay to have PT and OT. They will take her to the  ED tomorrow morning so we will be available for a telephone consultation. We spent a total of ( 35  ) minutes reviewing records and discussing these issues.  Garnette Olmsted, MD

## 2024-05-18 ENCOUNTER — Other Ambulatory Visit: Payer: Self-pay

## 2024-05-18 ENCOUNTER — Inpatient Hospital Stay (HOSPITAL_COMMUNITY)
Admission: EM | Admit: 2024-05-18 | Discharge: 2024-05-24 | DRG: 640 | Disposition: A | Discharge status: Skilled Nursing Facility | Attending: Internal Medicine | Admitting: Internal Medicine

## 2024-05-18 ENCOUNTER — Emergency Department (HOSPITAL_COMMUNITY)

## 2024-05-18 ENCOUNTER — Observation Stay (HOSPITAL_BASED_OUTPATIENT_CLINIC_OR_DEPARTMENT_OTHER)

## 2024-05-18 ENCOUNTER — Telehealth: Payer: Self-pay | Admitting: Licensed Clinical Social Worker

## 2024-05-18 DIAGNOSIS — Z888 Allergy status to other drugs, medicaments and biological substances status: Secondary | ICD-10-CM

## 2024-05-18 DIAGNOSIS — Z90710 Acquired absence of both cervix and uterus: Secondary | ICD-10-CM

## 2024-05-18 DIAGNOSIS — I5031 Acute diastolic (congestive) heart failure: Secondary | ICD-10-CM | POA: Diagnosis present

## 2024-05-18 DIAGNOSIS — K5900 Constipation, unspecified: Secondary | ICD-10-CM | POA: Diagnosis present

## 2024-05-18 DIAGNOSIS — R0609 Other forms of dyspnea: Secondary | ICD-10-CM | POA: Diagnosis not present

## 2024-05-18 DIAGNOSIS — Z7984 Long term (current) use of oral hypoglycemic drugs: Secondary | ICD-10-CM

## 2024-05-18 DIAGNOSIS — Z8249 Family history of ischemic heart disease and other diseases of the circulatory system: Secondary | ICD-10-CM

## 2024-05-18 DIAGNOSIS — Z87442 Personal history of urinary calculi: Secondary | ICD-10-CM

## 2024-05-18 DIAGNOSIS — I495 Sick sinus syndrome: Secondary | ICD-10-CM | POA: Diagnosis present

## 2024-05-18 DIAGNOSIS — I10 Essential (primary) hypertension: Secondary | ICD-10-CM | POA: Diagnosis not present

## 2024-05-18 DIAGNOSIS — R1313 Dysphagia, pharyngeal phase: Secondary | ICD-10-CM | POA: Diagnosis present

## 2024-05-18 DIAGNOSIS — I480 Paroxysmal atrial fibrillation: Secondary | ICD-10-CM | POA: Diagnosis not present

## 2024-05-18 DIAGNOSIS — Z79899 Other long term (current) drug therapy: Secondary | ICD-10-CM

## 2024-05-18 DIAGNOSIS — J3801 Paralysis of vocal cords and larynx, unilateral: Secondary | ICD-10-CM | POA: Diagnosis present

## 2024-05-18 DIAGNOSIS — Z681 Body mass index (BMI) 19 or less, adult: Secondary | ICD-10-CM

## 2024-05-18 DIAGNOSIS — I272 Pulmonary hypertension, unspecified: Secondary | ICD-10-CM | POA: Diagnosis present

## 2024-05-18 DIAGNOSIS — E876 Hypokalemia: Secondary | ICD-10-CM | POA: Diagnosis present

## 2024-05-18 DIAGNOSIS — K219 Gastro-esophageal reflux disease without esophagitis: Secondary | ICD-10-CM | POA: Diagnosis present

## 2024-05-18 DIAGNOSIS — Z882 Allergy status to sulfonamides status: Secondary | ICD-10-CM

## 2024-05-18 DIAGNOSIS — I2510 Atherosclerotic heart disease of native coronary artery without angina pectoris: Secondary | ICD-10-CM | POA: Diagnosis not present

## 2024-05-18 DIAGNOSIS — R630 Anorexia: Secondary | ICD-10-CM | POA: Diagnosis not present

## 2024-05-18 DIAGNOSIS — N183 Chronic kidney disease, stage 3 unspecified: Secondary | ICD-10-CM | POA: Diagnosis present

## 2024-05-18 DIAGNOSIS — R7989 Other specified abnormal findings of blood chemistry: Secondary | ICD-10-CM | POA: Diagnosis not present

## 2024-05-18 DIAGNOSIS — N1832 Chronic kidney disease, stage 3b: Secondary | ICD-10-CM | POA: Diagnosis present

## 2024-05-18 DIAGNOSIS — Z884 Allergy status to anesthetic agent status: Secondary | ICD-10-CM

## 2024-05-18 DIAGNOSIS — Z96642 Presence of left artificial hip joint: Secondary | ICD-10-CM | POA: Diagnosis present

## 2024-05-18 DIAGNOSIS — Z950 Presence of cardiac pacemaker: Secondary | ICD-10-CM

## 2024-05-18 DIAGNOSIS — Z885 Allergy status to narcotic agent status: Secondary | ICD-10-CM

## 2024-05-18 DIAGNOSIS — G4733 Obstructive sleep apnea (adult) (pediatric): Secondary | ICD-10-CM | POA: Diagnosis present

## 2024-05-18 DIAGNOSIS — E785 Hyperlipidemia, unspecified: Secondary | ICD-10-CM | POA: Diagnosis present

## 2024-05-18 DIAGNOSIS — I5033 Acute on chronic diastolic (congestive) heart failure: Secondary | ICD-10-CM | POA: Diagnosis not present

## 2024-05-18 DIAGNOSIS — D61818 Other pancytopenia: Secondary | ICD-10-CM | POA: Diagnosis not present

## 2024-05-18 DIAGNOSIS — I422 Other hypertrophic cardiomyopathy: Secondary | ICD-10-CM | POA: Diagnosis present

## 2024-05-18 DIAGNOSIS — I2489 Other forms of acute ischemic heart disease: Secondary | ICD-10-CM | POA: Diagnosis present

## 2024-05-18 DIAGNOSIS — R627 Adult failure to thrive: Secondary | ICD-10-CM | POA: Diagnosis not present

## 2024-05-18 DIAGNOSIS — Z7901 Long term (current) use of anticoagulants: Secondary | ICD-10-CM

## 2024-05-18 DIAGNOSIS — Z860100 Personal history of colon polyps, unspecified: Secondary | ICD-10-CM

## 2024-05-18 DIAGNOSIS — E7800 Pure hypercholesterolemia, unspecified: Secondary | ICD-10-CM | POA: Diagnosis not present

## 2024-05-18 DIAGNOSIS — I509 Heart failure, unspecified: Secondary | ICD-10-CM | POA: Diagnosis not present

## 2024-05-18 DIAGNOSIS — Z96611 Presence of right artificial shoulder joint: Secondary | ICD-10-CM | POA: Diagnosis not present

## 2024-05-18 DIAGNOSIS — Z91040 Latex allergy status: Secondary | ICD-10-CM

## 2024-05-18 DIAGNOSIS — K117 Disturbances of salivary secretion: Secondary | ICD-10-CM | POA: Diagnosis present

## 2024-05-18 DIAGNOSIS — R634 Abnormal weight loss: Secondary | ICD-10-CM | POA: Diagnosis present

## 2024-05-18 DIAGNOSIS — Z955 Presence of coronary angioplasty implant and graft: Secondary | ICD-10-CM

## 2024-05-18 DIAGNOSIS — Z8701 Personal history of pneumonia (recurrent): Secondary | ICD-10-CM

## 2024-05-18 DIAGNOSIS — Z87891 Personal history of nicotine dependence: Secondary | ICD-10-CM

## 2024-05-18 DIAGNOSIS — E440 Moderate protein-calorie malnutrition: Secondary | ICD-10-CM | POA: Diagnosis present

## 2024-05-18 DIAGNOSIS — I130 Hypertensive heart and chronic kidney disease with heart failure and stage 1 through stage 4 chronic kidney disease, or unspecified chronic kidney disease: Secondary | ICD-10-CM | POA: Diagnosis present

## 2024-05-18 DIAGNOSIS — I252 Old myocardial infarction: Secondary | ICD-10-CM

## 2024-05-18 LAB — ECHOCARDIOGRAM COMPLETE
Area-P 1/2: 3.49 cm2
Calc EF: 79.8 %
Est EF: >75
Height: 67 in
S' Lateral: 2.6 cm
Single Plane A2C EF: 84.1 %
Single Plane A4C EF: 73.7 %
Weight: 2000 [oz_av]

## 2024-05-18 LAB — CBG MONITORING, ED: Glucose-Capillary: 98 mg/dL (ref 70–99)

## 2024-05-18 LAB — MAGNESIUM: Magnesium: 1.8 mg/dL (ref 1.7–2.4)

## 2024-05-18 LAB — URINALYSIS, ROUTINE W REFLEX MICROSCOPIC
Bacteria, UA: NONE SEEN
Bilirubin Urine: NEGATIVE
Glucose, UA: >=500 mg/dL — AB
Ketones, ur: NEGATIVE mg/dL
Nitrite: NEGATIVE
Protein, ur: 30 mg/dL — AB
Specific Gravity, Urine: 1.01 (ref 1.005–1.030)
pH: 5 (ref 5.0–8.0)

## 2024-05-18 LAB — CBC
HCT: 35.4 % — ABNORMAL LOW (ref 36.0–46.0)
Hemoglobin: 11.1 g/dL — ABNORMAL LOW (ref 12.0–15.0)
MCH: 28.1 pg (ref 26.0–34.0)
MCHC: 31.4 g/dL (ref 30.0–36.0)
MCV: 89.6 fL (ref 80.0–100.0)
Platelets: 119 K/uL — ABNORMAL LOW (ref 150–400)
RBC: 3.95 MIL/uL (ref 3.87–5.11)
RDW: 19.7 % — ABNORMAL HIGH (ref 11.5–15.5)
WBC: 3.4 K/uL — ABNORMAL LOW (ref 4.0–10.5)
nRBC: 0 % (ref 0.0–0.2)

## 2024-05-18 LAB — PROTIME-INR
INR: 1.2 (ref 0.8–1.2)
Prothrombin Time: 16.3 s — ABNORMAL HIGH (ref 11.4–15.2)

## 2024-05-18 LAB — COMPREHENSIVE METABOLIC PANEL WITH GFR
ALT: 25 U/L (ref 0–44)
AST: 63 U/L — ABNORMAL HIGH (ref 15–41)
Albumin: 2.6 g/dL — ABNORMAL LOW (ref 3.5–5.0)
Alkaline Phosphatase: 67 U/L (ref 38–126)
Anion gap: 9 (ref 5–15)
BUN: 28 mg/dL — ABNORMAL HIGH (ref 8–23)
CO2: 25 mmol/L (ref 22–32)
Calcium: 11.5 mg/dL — ABNORMAL HIGH (ref 8.9–10.3)
Chloride: 104 mmol/L (ref 98–111)
Creatinine, Ser: 1.61 mg/dL — ABNORMAL HIGH (ref 0.44–1.00)
GFR, Estimated: 31 mL/min — ABNORMAL LOW (ref >60)
Glucose, Bld: 110 mg/dL — ABNORMAL HIGH (ref 70–99)
Potassium: 3 mmol/L — ABNORMAL LOW (ref 3.5–5.1)
Sodium: 138 mmol/L (ref 135–145)
Total Bilirubin: 0.7 mg/dL (ref 0.0–1.2)
Total Protein: 7.2 g/dL (ref 6.5–8.1)

## 2024-05-18 LAB — TROPONIN I (HIGH SENSITIVITY)
Troponin I (High Sensitivity): 238 ng/L (ref <18)
Troponin I (High Sensitivity): 211 ng/L (ref <18)

## 2024-05-18 LAB — BRAIN NATRIURETIC PEPTIDE: B Natriuretic Peptide: 852.3 pg/mL — ABNORMAL HIGH (ref 0.0–100.0)

## 2024-05-18 LAB — PHOSPHORUS: Phosphorus: 2 mg/dL — ABNORMAL LOW (ref 2.5–4.6)

## 2024-05-18 LAB — VITAMIN D 25 HYDROXY (VIT D DEFICIENCY, FRACTURES): Vit D, 25-Hydroxy: 114.46 ng/mL — ABNORMAL HIGH (ref 30–100)

## 2024-05-18 MED ORDER — LACTATED RINGERS IV BOLUS
1000.0000 mL | Freq: Once | INTRAVENOUS | Status: AC
  Administered 2024-05-18: 1000 mL via INTRAVENOUS

## 2024-05-18 MED ORDER — PANTOPRAZOLE SODIUM 40 MG PO TBEC
40.0000 mg | DELAYED_RELEASE_TABLET | Freq: Every day | ORAL | Status: DC
  Administered 2024-05-18 – 2024-05-24 (×7): 40 mg via ORAL
  Filled 2024-05-18 (×7): qty 1

## 2024-05-18 MED ORDER — PERFLUTREN LIPID MICROSPHERE
1.0000 mL | INTRAVENOUS | Status: AC | PRN
  Administered 2024-05-18: 1.5 mL via INTRAVENOUS

## 2024-05-18 MED ORDER — CARMEX CLASSIC LIP BALM EX OINT
TOPICAL_OINTMENT | CUTANEOUS | Status: DC | PRN
  Filled 2024-05-18: qty 10

## 2024-05-18 MED ORDER — ACETAMINOPHEN 325 MG PO TABS
650.0000 mg | ORAL_TABLET | Freq: Four times a day (QID) | ORAL | Status: DC | PRN
  Administered 2024-05-21 – 2024-05-24 (×4): 650 mg via ORAL
  Filled 2024-05-18 (×4): qty 2

## 2024-05-18 MED ORDER — LACTULOSE 10 GM/15ML PO SOLN
10.0000 g | Freq: Two times a day (BID) | ORAL | Status: AC
  Administered 2024-05-18 – 2024-05-19 (×2): 10 g via ORAL
  Filled 2024-05-18 (×2): qty 30

## 2024-05-18 MED ORDER — METOPROLOL SUCCINATE ER 25 MG PO TB24
25.0000 mg | ORAL_TABLET | Freq: Every day | ORAL | Status: DC
  Administered 2024-05-18 – 2024-05-24 (×7): 25 mg via ORAL
  Filled 2024-05-18 (×7): qty 1

## 2024-05-18 MED ORDER — EMPAGLIFLOZIN 10 MG PO TABS
10.0000 mg | ORAL_TABLET | Freq: Every day | ORAL | Status: DC
  Administered 2024-05-19 – 2024-05-24 (×6): 10 mg via ORAL
  Filled 2024-05-18 (×6): qty 1

## 2024-05-18 MED ORDER — MIRTAZAPINE 15 MG PO TABS
15.0000 mg | ORAL_TABLET | Freq: Every day | ORAL | Status: DC
Start: 2024-05-18 — End: 2024-05-24
  Administered 2024-05-18 – 2024-05-23 (×6): 15 mg via ORAL
  Filled 2024-05-18 (×6): qty 1

## 2024-05-18 MED ORDER — MEGESTROL ACETATE 40 MG PO TABS
40.0000 mg | ORAL_TABLET | Freq: Every day | ORAL | Status: DC
  Administered 2024-05-19 – 2024-05-24 (×6): 40 mg via ORAL
  Filled 2024-05-18 (×6): qty 1

## 2024-05-18 MED ORDER — POTASSIUM CHLORIDE CRYS ER 20 MEQ PO TBCR
40.0000 meq | EXTENDED_RELEASE_TABLET | Freq: Once | ORAL | Status: AC
  Administered 2024-05-18: 40 meq via ORAL
  Filled 2024-05-18: qty 2

## 2024-05-18 MED ORDER — ACETAMINOPHEN 650 MG RE SUPP: 650.0000 mg | Freq: Four times a day (QID) | RECTAL | Status: DC | PRN

## 2024-05-18 MED ORDER — MAGNESIUM SULFATE 2 GM/50ML IV SOLN
2.0000 g | Freq: Once | INTRAVENOUS | Status: DC
  Administered 2024-05-18: 2 g via INTRAVENOUS
  Filled 2024-05-18: qty 50

## 2024-05-18 MED ORDER — K PHOS MONO-SOD PHOS DI & MONO 155-852-130 MG PO TABS
250.0000 mg | ORAL_TABLET | Freq: Three times a day (TID) | ORAL | Status: AC
  Administered 2024-05-18 – 2024-05-19 (×4): 250 mg via ORAL
  Filled 2024-05-18 (×4): qty 1

## 2024-05-18 MED ORDER — FUROSEMIDE 10 MG/ML IJ SOLN
20.0000 mg | Freq: Every day | INTRAMUSCULAR | Status: DC
  Administered 2024-05-18 – 2024-05-19 (×2): 20 mg via INTRAVENOUS
  Filled 2024-05-18 (×2): qty 2

## 2024-05-18 MED ORDER — ISOSORBIDE MONONITRATE ER 30 MG PO TB24
30.0000 mg | ORAL_TABLET | Freq: Every day | ORAL | Status: DC
  Administered 2024-05-18 – 2024-05-24 (×7): 30 mg via ORAL
  Filled 2024-05-18 (×7): qty 1

## 2024-05-18 MED ORDER — ASPIRIN 81 MG PO CHEW
324.0000 mg | CHEWABLE_TABLET | Freq: Once | ORAL | Status: AC
  Administered 2024-05-18: 324 mg via ORAL
  Filled 2024-05-18: qty 4

## 2024-05-18 MED ORDER — APIXABAN 2.5 MG PO TABS
2.5000 mg | ORAL_TABLET | Freq: Two times a day (BID) | ORAL | Status: DC
  Administered 2024-05-18 – 2024-05-24 (×12): 2.5 mg via ORAL
  Filled 2024-05-18 (×12): qty 1

## 2024-05-18 MED ORDER — POTASSIUM CHLORIDE CRYS ER 20 MEQ PO TBCR
40.0000 meq | EXTENDED_RELEASE_TABLET | Freq: Once | ORAL | Status: AC
Start: 2024-05-18 — End: 2024-05-18
  Administered 2024-05-18: 40 meq via ORAL
  Filled 2024-05-18: qty 2

## 2024-05-18 MED ORDER — MAGNESIUM OXIDE -MG SUPPLEMENT 400 (240 MG) MG PO TABS
800.0000 mg | ORAL_TABLET | Freq: Once | ORAL | Status: AC
  Administered 2024-05-18: 800 mg via ORAL
  Filled 2024-05-18: qty 2

## 2024-05-18 MED ORDER — ONDANSETRON HCL 4 MG PO TABS: 4.0000 mg | ORAL_TABLET | Freq: Four times a day (QID) | ORAL | Status: DC | PRN

## 2024-05-18 MED ORDER — ONDANSETRON HCL 4 MG/2ML IJ SOLN: 4.0000 mg | Freq: Four times a day (QID) | INTRAMUSCULAR | Status: DC | PRN

## 2024-05-18 NOTE — H&P (Signed)
 History and Physical    Patient: Barbara [name] FMW:[id number] DOB: 1941-07-23 DOA: 05/18/2024 DOS: the patient was seen and examined on 05/18/2024 PCP: Johnny Garnette LABOR, MD  Patient coming from: Home  Chief Complaint:  Chief Complaint  Patient presents with   Leg Swelling   HPI: Barbara [name] is a 83 y.o. female with medical history significant of bilateral saphenous ablation, CAD, NSTEMI, chronic diastolic CHF heart murmur, pacemaker placement, dyspnea, hypertension, hyperlipidemia, colon polyps, GERD, nephrolithiasis, osteoarthritis, history of pneumonia, sleep apnea, hypercalcemia who was sent to the emergency department by recommendation of her primary care provider due to exertional dyspnea, lower extremity edema, failure to thrive with poor appetite and muscle pains.  The patient has been hypercalcemic for several months, but PTH and PTH like peptide were normal.  She also has CT chest and CT abdomen/pelvis with no acute abnormalities.  She was seen by hematology last month and there was no monoclonal gammopathy.  She denied fever, chills, rhinorrhea, sore throat, wheezing or hemoptysis. No chest pain, palpitations, diaphoresis, PND, orthopnea, but has had progressively worse pitting edema of the lower extremities in the last few weeks.  Her appetite is decreased and she has been constipated for over a week.  She gets occasional dyspnea, which she attributes to a long history of vocal cord dysfunction, but could not specify when did this happen.  No abdominal pain, nausea, emesis, diarrhea, melena or hematochezia.  No flank pain, dysuria, frequency or hematuria.  No polyuria, polydipsia, polyphagia or blurred vision.   Lab work: CBC showed white count of 3.4, hemoglobin 11.1 g/dL platelets 880.  PT 16.3, INR 1.2.  Troponin level is 238 ng/L with second set pending.  CMP shows sodium of 138, potassium 3.0, chloride 104 and CO2 25 mmol/L.  Glucose on 910, BUN 28, creatinine 1.61 and  corrected calcium  12.6 mg/dL.  Total protein 7.2 and albumin 2.6 g/dL.  AST was 63 units/L, the rest of the LFTs were normal.  Imaging: Portable 1 view chest radiograph with normal cardiomediastinal silhouette and pulmonary vascular status within normal limits.  No acute cardiopulmonary process.   ED course: Initial vital signs were temperature 98.7 F, pulse 86, respiration 18, BP 131/60 mmHg O2 sat 98% on room air.  The patient received aspirin  324 mg, LR 1000 mm bolus, magnesium  oxide 100 mg p.o. and KCl 40 mill equivalents p.o. x 1.  She received aspirin  324 mg p.o., LR 1000 mL bolus, mag oxide 100 mg p.o. x 1 and KCl 40 mill equivalents p.o. x 1.  Review of Systems: As mentioned in the history of present illness. All other systems reviewed and are negative. Past Medical History:  Diagnosis Date   A-fib [facility])    Allergy    CAD (coronary artery disease)    sees Dr. Debby Sor  cardiac stents - 2000   CHF (congestive heart failure) (HCC)    Colon polyps    Complication of anesthesia    rash/hives with caines   Dyspnea    02/12/18  when my heart gets out of rhythm, it has not been out of rhythym- since I have been on Tikosyn  (11/2017)   Dysrhythmia    afib fib   GERD (gastroesophageal reflux disease)    takes OTC- Omeprazole - prn   Heart murmur    History of kidney stones    History of stress test    show normal perfusion without scar or ischemia, post EF 68%   Hx of echocardiogram  show an EF 55%-60% range with grade 1 diastolic dysfunction, she had mitral anular calcification with mild MR, moderate LA dilation and mild pulmonary hypertension with a PA estimated pressure of 39mm   Hyperlipidemia    Hypertension    NSTEMI (non-ST elevated myocardial infarction) (HCC)    Osteoarthritis    Pacemaker    Pneumonia    hx of 2015    PONV (postoperative nausea and vomiting)    Sleep apnea    Past Surgical History:  Procedure Laterality Date   CARDIAC CATHETERIZATION      11/2017   cardiac stents  2000   COLONOSCOPY  01-05-14   per Dr. Norleen Hint, clear, no repeats needed    COLONOSCOPY     CORONARY STENT PLACEMENT  2000   in LAD   CYSTOSCOPY WITH RETROGRADE PYELOGRAM, URETEROSCOPY AND STENT PLACEMENT Left 06/11/2022   Procedure: CYSTOSCOPY WITH RETROGRADE PYELOGRAM, LEFT STENT PLACEMENT;  Surgeon: Carolee Sherwood JONETTA DOUGLAS, MD;  Location: [facility];  Service: Urology;  Laterality: Left;   CYSTOSCOPY/URETEROSCOPY/HOLMIUM LASER/STENT PLACEMENT Left 06/25/2022   Procedure: CYSTOSCOPY LEFT URETEROSCOPY/HOLMIUM LASER/STENT PLACEMENT;  Surgeon: Watt Norleen, MD;  Location: [facility];  Service: Urology;  Laterality: Left;  1 HR FOR THIS CASE   DIRECT LARYNGOSCOPY WITH RADIAESSE INJECTION N/A 02/13/2018   Procedure: DIRECT LARYNGOSCOPY WITH RADIAESSE INJECTION;  Surgeon: Carlie Clark, MD;  Location: [facility] OR;  Service: ENT;  Laterality: N/A;   ESOPHAGOGASTRODUODENOSCOPY N/A 03/11/2024   Procedure: EGD (ESOPHAGOGASTRODUODENOSCOPY);  Surgeon: Rosalie Kitchens, MD;  Location: [facility] ENDOSCOPY;  Service: Gastroenterology;  Laterality: N/A;   EYE SURGERY Left    CATARACT REMOVAL   HIP ARTHROPLASTY Left 09/27/2023   Procedure: ARTHROPLASTY BIPOLAR HIP (HEMIARTHROPLASTY);  Surgeon: Beverley Evalene JONETTA, MD;  Location: [facility] OR;  Service: Orthopedics;  Laterality: Left;   KNEE ARTHROSCOPY Left 01/06/2015   Procedure: LEFT KNEE ARTHROSCOPY, abrasion chondroplasty of the medial femerol condryl,medial and lateral menisectomy, microfracture , synovectomy of the suprpatellar pouch;  Surgeon: Tanda Heading, MD;  Location: [facility];  Service: Orthopedics;  Laterality: Left;   LEFT HEART CATH AND CORONARY ANGIOGRAPHY N/A 02/26/2019   Procedure: LEFT HEART CATH AND CORONARY ANGIOGRAPHY;  Surgeon: Burnard Debby LABOR, MD;  Location: [facility] INVASIVE CV LAB;  Service: Cardiovascular;  Laterality: N/A;   LEFT HEART CATH AND CORONARY ANGIOGRAPHY N/A 05/06/2022   Procedure: LEFT HEART CATH AND CORONARY ANGIOGRAPHY;  Surgeon: Mady Bruckner,  MD;  Location: [facility] INV[facility] CV LAB;  Service: Cardiovascular;  Laterality: N/A;   MICROLARYNGOSCOPY W/VOCAL CORD INJECTION N/A 08/07/2018   Procedure: MICROLARYNGOSCOPY WITH VOCAL CORD INJECTION OF PROLARYN;  Surgeon: Carlie Clark, MD;  Location: [facility] OR;  Service: ENT;  Laterality: N/A;  JET VENTILATION   PACEMAKER IMPLANT N/A 05/08/2022   Procedure: PACEMAKER IMPLANT;  Surgeon: Inocencio Soyla Lunger, MD;  Location: [facility] INV[facility] CV LAB;  Service: Cardiovascular;  Laterality: N/A;   REVERSE SHOULDER ARTHROPLASTY Right 03/23/2020   Procedure: REVERSE SHOULDER ARTHROPLASTY;  Surgeon: Sharl Selinda Dover, MD;  Location: [facility] OR;  Service: Orthopedics;  Laterality: Right;   VAGINAL HYSTERECTOMY  1971   Social History:  reports that she quit smoking about 27 years ago. Her smoking use included cigarettes. She started smoking about 67 years ago. She has never used smokeless tobacco. She reports that she does not drink alcohol and does not use drugs.  Allergies  Allergen Reactions   Lidocaine  Anaphylaxis   Morphine  Other (See Comments)    Body shuts down   Procaine Hcl Anaphylaxis, Rash and Other (  See Comments)    Anything with 'caine' in it    Sulfonamide Derivatives Hives   Amlodipine  Swelling   Norco [Hydrocodone -Acetaminophen ] Nausea And Vomiting and Other (See Comments)    States does ok with IV form    Tramadol  Nausea And Vomiting   Vytorin [Ezetimibe-Simvastatin] Other (See Comments)    Joint pain   Tape Itching and Other (See Comments)    Patient prefers either paper tape or Coban wrap    Family History  Problem Relation Age of Onset   Cancer Maternal Grandmother    Heart disease Maternal Grandfather     Prior to Admission medications   Medication Sig Start Date End Date Taking? Authorizing Provider  acetaminophen  (TYLENOL ) 500 MG tablet Take 500 mg by mouth daily as needed for headache.    [provider]  alum & mag hydroxide-simeth (MAALOX/MYLANTA) 200-200-20 MG/5ML  suspension Take 30 mLs by mouth every 4 (four) hours as needed for indigestion. 09/30/23   Leotis Bogus, MD  apixaban  (ELIQUIS ) 2.5 MG TABS tablet Take 1 tablet (2.5 mg total) by mouth 2 (two) times daily. 01/21/24   Johnny Garnette LABOR, MD  Cholecalciferol  (VITAMIN D3) 2000 units TABS Take 2,000 Units by mouth in the morning.    [provider]  empagliflozin  (JARDIANCE ) 10 MG TABS tablet Take 1 tablet (10 mg total) by mouth daily. 01/21/24   Johnny Garnette LABOR, MD  hydrALAZINE  (APRESOLINE ) 100 MG tablet Take 1.5 tablets (150 mg total) by mouth 2 (two) times daily. 12/26/23   Johnny Garnette LABOR, MD  ipratropium (ATROVENT ) 0.06 % nasal spray Place 2 sprays into both nostrils 2 (two) times daily as needed (allergies). 04/28/18   [provider]  isosorbide  mononitrate (IMDUR ) 30 MG 24 hr tablet Take 1 tablet (30 mg total) by mouth daily. 02/24/24   Johnny Garnette LABOR, MD  ketoconazole  (NIZORAL ) 2 % cream Apply 1 Application topically 2 (two) times daily as needed for irritation. 12/26/23   Johnny Garnette LABOR, MD  loratadine  (CLARITIN ) 10 MG tablet Take 10 mg by mouth daily as needed for allergies.    [provider]  LORazepam  (ATIVAN ) 0.5 MG tablet TAKE 1 TABLET BY MOUTH EVERY 6 HOURS AS NEEDED FOR ANXIETY 12/23/23   Johnny Garnette LABOR, MD  megestrol  (MEGACE ) 40 MG tablet Take 1 tablet (40 mg total) by mouth in the morning. 04/30/24   Johnny Garnette LABOR, MD  metoprolol  succinate (TOPROL -XL) 25 MG 24 hr tablet Take 1 tablet (25 mg total) by mouth daily. Take with or immediately following a meal. 03/11/24   Gonfa, Mignon DASEN, MD  mirtazapine  (REMERON ) 15 MG tablet Take 1 tablet (15 mg total) by mouth at bedtime. 05/04/24   Johnny Garnette LABOR, MD  nitroGLYCERIN  (NITROSTAT ) 0.4 MG SL tablet Place 1 tablet (0.4 mg total) under the tongue every 5 (five) minutes as needed for chest pain. 04/06/24   Johnny Garnette LABOR, MD  pantoprazole  (PROTONIX ) 40 MG tablet Take 1 tablet (40 mg total) by mouth daily. 02/13/24 02/13/25  Johnny Garnette LABOR,  MD  Probiotic Product (PROBIOTIC GUMMIES PO) Take 5 each by mouth every morning. Align Probiotic    [provider]  rosuvastatin  (CRESTOR ) 40 MG tablet Take 1 tablet by mouth once daily Patient taking differently: Take 40 mg by mouth at bedtime. 08/22/23   Burnard Debby LABOR, MD  triamcinolone  (NASACORT) 55 MCG/ACT nasal inhaler Place 1 spray into both nostrils daily as needed (for allergies).  10/17/12   Johnny Garnette LABOR, MD  Physical Exam: Vitals:   05/18/24 1049 05/18/24 1100 05/18/24 1115 05/18/24 1130  BP: 131/60 (!) 115/52 (!) 118/53 (!) 125/47  Pulse: 86  (!) 59 (!) 59  Resp: 18  19 16   Temp: 98.7 F (37.1 C)     TempSrc: Oral     SpO2: 98%  100% 100%  Weight:      Height:       Physical Exam Vitals reviewed.  Constitutional:      General: She is awake. She is not in acute distress.    Appearance: She is ill-appearing.  HENT:     Head: Normocephalic.     Nose: No rhinorrhea.     Mouth/Throat:     Mouth: Mucous membranes are dry.  Eyes:     General: No scleral icterus.    Pupils: Pupils are equal, round, and reactive to light.  Neck:     Vascular: No JVD.  Cardiovascular:     Rate and Rhythm: Normal rate and regular rhythm.     Heart sounds: S1 normal and S2 normal.  Pulmonary:     Effort: Pulmonary effort is normal.     Breath sounds: Normal breath sounds. No wheezing, rhonchi or rales.  Abdominal:     General: Bowel sounds are normal. There is no distension.     Palpations: Abdomen is soft.     Tenderness: There is no abdominal tenderness. There is no right CVA tenderness or left CVA tenderness.  Musculoskeletal:     Cervical back: Neck supple.     Right lower leg: Edema present.     Left lower leg: Edema present.  Skin:    General: Skin is warm and dry.  Neurological:     General: No focal deficit present.     Mental Status: She is alert and oriented to person, place, and time.  Psychiatric:        Behavior: Behavior is cooperative.     Data  Reviewed:  Results are pending, will review when available.  05/04/2022 echocardiogram report. IMPRESSIONS:   1. Left ventricular ejection fraction, by estimation, is >75%. The left  ventricle has hyperdynamic function. The left ventricle has no regional  wall motion abnormalities. Left ventricular diastolic parameters are  indeterminate. Elevated left atrial  pressure.   2. Right ventricular systolic function is normal. The right ventricular  size is normal. There is mildly elevated pulmonary artery systolic  pressure.   3. Left atrial size was moderately dilated.   4. The mitral valve is normal in structure. Moderate mitral valve  regurgitation. No evidence of mitral stenosis.   5. The aortic valve is tricuspid. Aortic valve regurgitation is not  visualized. Aortic valve sclerosis is present, with no evidence of aortic  valve stenosis.   6. The inferior vena cava is normal in size with greater than 50%  respiratory variability, suggesting right atrial pressure of 3 mmHg.   EKG: Vent. rate 61 BPM PR interval 86 ms QRS duration 95 ms QT/QTcB 421/424 ms P-R-T axes 257 63 261 Sinus or ectopic atrial rhythm Atrial premature complexes in couplets Short PR interval Probable LVH with secondary repol abnrm ST depression, consider ischemia, diffuse lds  Assessment and Plan: Principal Problem:   Failure to thrive in adult With associated:   Loss of appetite Resulting in:   Abnormal weight loss And:   Moderate protein malnutrition (HCC) No clear etiology. May be due to persistent hypercalcemia. Observation/telemetry. Continue Megace  40 mg p.o. daily. Continue mirtazapine  15 mg  p.o. bedtime. Consult TOC team. Consult PT and OT. Consider nutritional services evaluation. May benefit from protein supplementation. The patient has pharyngeal dysphagia. -Reconsult SLP for longstanding dysphagia/vocal cord dysfunction.  Active Problems:   Elevated troponin In the setting  of:   Acute on chronic diastolic congestive heart failure (HCC) Observation/telemetry. Supplemental oxygen as needed. Sodium and fluid restriction. Continue furosemide  20 mg IVP daily. Continue metoprolol  and Jardiance . Monitor daily weights, intake and output. Monitor renal function electrolytes. Check echocardiogram. Cardiology consult appreciated.    CAD (coronary artery disease/elevated troponin Continue DOAC, metoprolol  and resume statin.    PAF (paroxysmal atrial fibrillation) (HCC) CHA?DS?-VASc Score of at least 6. Continue apixaban  2.5 mg p.o. twice daily. On metoprolol  for rate control.    Essential hypertension Continue metoprolol  succinate 25 mg p.o. daily. Also on furosemide  20 mg BP daily.    Hypercalcemia Negative workup for gammopathy. Discussed with Dr. Autumn who saw her last month. -Recommended to consider repeat chest CT  ---to look for lymphadenopathy or sarcoidosis. Will try to treat constipation. Received magnesium  oxide 800 mg p.o. earlier today. Will try lactulose  10 g p.o. x 2 doses.    Hypophosphatemia Replacing. Follow-up phosphorus level as needed.    Hypokalemia Replacing. Magnesium  was supplemented. Follow-up potassium level in the morning.    Pancytopenia (HCC) Also discussed with Dr. Pasam  Might be due to age or early MDS. May consider IR bone marrow biopsy.    CKD (chronic kidney disease), stage III (HCC) Monitor renal function electrolytes.    Hyperlipidemia Statin held by PCP due to body aches. AST is also mildly elevated.    Gastroesophageal reflux disease Continue metoprolol  40 mg p.o. daily.    OSA (obstructive sleep apnea) Continue CPAP at bedtime.    Advance Care Planning:   Code Status: Full Code   Consults: Cardiology Gwynn Scarce, MD.).  Family Communication:   Severity of Illness: The appropriate patient status for this patient is OBSERVATION. Observation status is judged to be reasonable and  necessary in order to provide the required intensity of service to ensure the patient's safety. The patient's presenting symptoms, physical exam findings, and initial radiographic and laboratory data in the context of their medical condition is felt to place them at decreased risk for further clinical deterioration. Furthermore, it is anticipated that the patient will be medically stable for discharge from the hospital within 2 midnights of admission.   Author: Alm Dorn Castor, MD 05/18/2024 1:09 PM  For on call review [email or web address].   This document was prepared using Dragon voice recognition software and may contain some unintended transcription errors.

## 2024-05-18 NOTE — ED Triage Notes (Addendum)
 Patient to ED by POV with c/o weakness, SOB with ambulation and bilateral ankle swelling. She was seen at PCP yesterday and was told to follow up in ED.

## 2024-05-18 NOTE — ED Provider Notes (Signed)
 [place] EMERGENCY DEPARTMENT AT [facility] Provider Note  CSN: [id number] Arrival date & time: 05/18/24 1035  Chief Complaint(s) Leg Swelling  HPI Barbara [name] is a 83 y.o. female with PMH A-fib, CAD status post DES placement, CHF, pacemaker in place who presents emergency room for evaluation of lower extremity swelling, generalized fatigue and failure to thrive.  Was seen on 05/08/2024 and ultimately discharged.  Follow back up with yesterday who is concerned about lack of available resources at home and patient's worsening functional status.  Patient return to the emergency department for further workup as she states that now she has worsening lower extremity edema since coming off of her Lasix .  Endorses exertional shortness of breath but denies chest pain.  Denies headache, fever, vomiting or other systemic symptoms   Past Medical History Past Medical History:  Diagnosis Date   A-fib (HCC)    Allergy    CAD (coronary artery disease)    sees Dr. Debby Sor  cardiac stents - 2000   CHF (congestive heart failure) (HCC)    Colon polyps    Complication of anesthesia    rash/hives with caines   Dyspnea    02/12/18  when my heart gets out of rhythm, it has not been out of rhythym- since I have been on Tikosyn  (11/2017)   Dysrhythmia    afib fib   GERD (gastroesophageal reflux disease)    takes OTC- Omeprazole - prn   Heart murmur    History of kidney stones    History of stress test    show normal perfusion without scar or ischemia, post EF 68%   Hx of echocardiogram    show an EF 55%-60% range with grade 1 diastolic dysfunction, she had mitral anular calcification with mild MR, moderate LA dilation and mild pulmonary hypertension with a PA estimated pressure of 39mm   Hyperlipidemia    Hypertension    NSTEMI (non-ST elevated myocardial infarction) (HCC)    Osteoarthritis    Pacemaker    Pneumonia    hx of 2015    PONV (postoperative nausea and  vomiting)    Sleep apnea    Patient Active Problem List   Diagnosis Date Noted   Failure to thrive in adult 05/17/2024   Loss of appetite 05/04/2024   Hypercalcemia 04/06/2024   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   Normocytic anemia 03/09/2024   GI bleed 03/09/2024   ABLA (acute blood loss anemia) 03/09/2024   Abnormal weight loss 03/08/2024   Ankle edema, bilateral 11/19/2023   Chronic diastolic CHF (congestive heart failure) (HCC) 10/22/2023   Closed displaced fracture of left femoral neck (HCC) 10/22/2023   Closed left subtrochanteric femur fracture (HCC) 09/26/2023   Bronchitis 08/04/2022   Ureteral stone 06/26/2022   Renal stones 06/26/2022   S/P cystoscopy 06/25/2022   Nephrolithiasis, 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, 5mm in size, leading to moderate left hydroureteronephrosis and significant left perinephric stranding /edema 06/09/2022   OSA (obstructive sleep apnea) 06/09/2022   Acquired trigger finger of right index finger 05/10/2021   Trigger thumb of left hand 05/10/2021   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   Persistent atrial fibrillation (HCC) 10/07/2020   CKD (chronic kidney disease), stage III (HCC) 05/20/2020   Apical variant hypertrophic cardiomyopathy (HCC)    Chest pain of uncertain etiology    Chest pain    S/P reverse total shoulder arthroplasty, 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   Encounter for orthopedic follow-up  care 02/18/2020   PAF (paroxysmal atrial fibrillation) (HCC) 02/28/2019   Demand myocardial infarction (HCC) 02/25/2019   Pain in left knee 01/06/2018   Dysphonia 12/29/2017   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 Authorizing Provider  acetaminophen  (TYLENOL ) 500 MG tablet Take 500 mg by mouth daily as needed for headache.    [provider]  alum & mag hydroxide-simeth (MAALOX/MYLANTA) 200-200-20 MG/5ML suspension Take 30 mLs by mouth every 4 (four) hours as needed for indigestion. 09/30/23   Leotis Bogus, MD  apixaban  (ELIQUIS ) 2.5 MG TABS tablet Take 1 tablet (2.5 mg total) by mouth 2 (two) times daily. 01/21/24   Johnny Garnette LABOR, MD  Cholecalciferol  (VITAMIN D3) 2000 units TABS Take 2,000 Units by mouth in the morning.    [provider]  empagliflozin  (JARDIANCE ) 10 MG TABS tablet Take 1 tablet (10 mg total) by mouth daily. 01/21/24   Johnny Garnette LABOR, MD  hydrALAZINE  (APRESOLINE ) 100 MG tablet Take 1.5 tablets (150 mg total) by mouth 2 (two) times daily. 12/26/23   Johnny Garnette LABOR, MD  ipratropium (ATROVENT ) 0.06 % nasal spray Place 2 sprays into both nostrils 2 (two) times daily as needed (allergies). 04/28/18   [provider]  isosorbide  mononitrate (IMDUR ) 30 MG 24 hr tablet Take 1 tablet (30 mg total) by mouth daily. 02/24/24   Johnny Garnette LABOR, MD  ketoconazole  (NIZORAL ) 2 % cream Apply 1 Application topically 2 (two) times daily as needed for irritation. 12/26/23   Johnny Garnette LABOR, MD  loratadine  (CLARITIN ) 10 MG tablet Take 10 mg by mouth daily as needed for allergies.    [provider]  LORazepam  (ATIVAN ) 0.5 MG tablet TAKE 1 TABLET BY MOUTH EVERY 6 HOURS AS NEEDED FOR ANXIETY 12/23/23   Johnny Garnette LABOR, MD  megestrol  (MEGACE ) 40 MG tablet Take 1 tablet (40 mg total) by mouth in the morning. 04/30/24   Johnny Garnette LABOR, MD  metoprolol  succinate (TOPROL -XL) 25 MG 24 hr tablet Take 1 tablet (25 mg total) by  mouth daily. Take with or immediately following a meal. 03/11/24   Gonfa, Taye T, MD  mirtazapine  (REMERON ) 15 MG tablet Take 1 tablet (15 mg total) by mouth at bedtime. 05/04/24   Johnny Garnette LABOR, MD  nitroGLYCERIN  (NITROSTAT ) 0.4 MG SL tablet Place 1 tablet (0.4 mg total) under the tongue every 5 (five) minutes as needed for chest pain. 04/06/24   Johnny Garnette LABOR, MD  pantoprazole  (PROTONIX ) 40 MG tablet Take 1 tablet (40 mg total) by mouth daily. 02/13/24 02/13/25  Johnny Garnette LABOR, MD  Probiotic Product (PROBIOTIC GUMMIES PO) Take 5 each by mouth every morning. Align Probiotic    [provider]  rosuvastatin  (CRESTOR ) 40 MG tablet Take 1 tablet by mouth once daily Patient taking differently: Take 40 mg by mouth at bedtime. 08/22/23   Burnard Debby LABOR, MD  triamcinolone  (NASACORT) 55 MCG/ACT nasal inhaler Place 1 spray into both nostrils daily as needed (for allergies).  10/17/12   Johnny Garnette LABOR, MD                                                                                                                                    Past Surgical History Past Surgical History:  Procedure Laterality Date   CARDIAC CATHETERIZATION     11/2017   cardiac stents  2000   COLONOSCOPY  01-05-14   per Dr. Norleen Hint, clear, no repeats needed    COLONOSCOPY     CORONARY STENT PLACEMENT  2000   in LAD   CYSTOSCOPY WITH RETROGRADE PYELOGRAM, URETEROSCOPY AND STENT PLACEMENT Left 06/11/2022   Procedure: CYSTOSCOPY WITH RETROGRADE PYELOGRAM, LEFT STENT PLACEMENT;  Surgeon: Carolee Sherwood JONETTA DOUGLAS, MD;  Location: [facility];  Service: Urology;  Laterality: Left;   CYSTOSCOPY/URETEROSCOPY/HOLMIUM LASER/STENT PLACEMENT Left 06/25/2022   Procedure: CYSTOSCOPY LEFT URETEROSCOPY/HOLMIUM LASER/STENT PLACEMENT;  Surgeon: Watt Norleen, MD;  Location: [facility];  Service: Urology;  Laterality: Left;  1 HR FOR THIS CASE   DIRECT LARYNGOSCOPY WITH RADIAESSE INJECTION N/A 02/13/2018   Procedure: DIRECT LARYNGOSCOPY WITH RADIAESSE INJECTION;   Surgeon: Carlie Clark, MD;  Location: [facility] OR;  Service: ENT;  Laterality: N/A;   ESOPHAGOGASTRODUODENOSCOPY N/A 03/11/2024   Procedure: EGD (ESOPHAGOGASTRODUODENOSCOPY);  Surgeon: Rosalie Kitchens, MD;  Location[facility] ENDOSCOPY;  Service: Gastroenterology;  Laterality: N/A;   EYE SURGERY Left    CATARACT REMOVAL   HIP ARTHROPLASTY Left 09/27/2023   Procedure: ARTHROPLASTY BIPOLAR HIP (HEMIARTHROPLASTY);  Surgeon: Beverley Evalene JONETTA, MD;  Location: [facility] OR;  Service: Orthopedics;  Laterality: Left;   KNEE ARTHROSCOPY Left 01/06/2015   Procedure: LEFT KNEE ARTHROSCOPY, abrasion chondroplasty of the medial femerol condryl,medial and lateral menisectomy, microfracture , synovectomy of the suprpatellar pouch;  Surgeon: Tanda Heading, MD;  Location: [facility];  Service: Orthopedics;  Laterality: Left;   LEFT HEART CATH AND CORONARY ANGIOGRAPHY N/A 02/26/2019   Procedure: LEFT HEART CATH AND CORONARY ANGIOGRAPHY;  Surgeon: Burnard Debby LABOR, MD;  Location: [facility] INVASIVE CV LAB;  Service: Cardiovascular;  Laterality: N/A;   LEFT HEART CATH AND CORONARY ANGIOGRAPHY N/A 05/06/2022   Procedure: LEFT HEART CATH AND CORONARY ANGIOGRAPHY;  Surgeon: Mady Bruckner, MD;  Location: [facility] INVAS[facility] CV LAB;  Service: Cardiovascular;  Laterality: N/A;   MICROLARYNGOSCOPY W/VOCAL CORD INJECTION N/A 08/07/2018   Procedure: MICROLARYNGOSCOPY WITH VOCAL CORD INJECTION OF PROLARYN;  Surgeon: Carlie Clark, MD;  Location: [facility] OR;  Service: ENT;  Laterality: N/A;  JET VENTILATION   PACEMAKER IMPLANT N/A 05/08/2022   Procedure: PACEMAKER IMPLANT;  Surgeon: Inocencio Soyla Lunger, MD;  Location: [facility] INVASIVE CV LAB;  Service: Cardiovascular;  Laterality: N/A;   REVERSE SHOULDER ARTHROPLASTY Right 03/23/2020   Procedure: REVERSE SHOULDER ARTHROPLASTY;  Surgeon: Sharl Selinda Dover, MD;  Location: [facility] OR;  Service: Orthopedics;  Laterality: Right;   VAGINAL HYSTERECTOMY  1971   Family History Family History  Problem Relation Age of Onset   Cancer  Maternal Grandmother    Heart disease Maternal Grandfather     Social History Social History   Tobacco Use   Smoking status: Former    Current packs/day: 0.00    Types: Cigarettes    Start date: 11/04/1956    Quit date: 11/04/1996    Years since quitting: 27.5   Smokeless tobacco: Never  Vaping Use   Vaping status: Never Used  Substance Use Topics   Alcohol use: No    Alcohol/week: 0.0 standard drinks of alcohol   Drug use: No   Allergies Lidocaine , Morphine , Procaine hcl, Sulfonamide derivatives, Amlodipine , Norco [hydrocodone -acetaminophen ], Tramadol , Vytorin [ezetimibe-simvastatin], and Tape  Review of Systems Review of Systems  Constitutional:  Positive for fatigue.  Cardiovascular:  Positive for leg swelling.  Neurological:  Positive for weakness.    Physical Exam Vital Signs  I have reviewed the triage vital signs BP (!) 125/47   Pulse (!) 59   Temp 98.7 F (37.1 C) (Oral)   Resp 16   Ht 5' 7 (1.702 m)   Wt 56.7 kg   SpO2 100%   BMI 19.58 kg/m   Physical Exam Vitals and nursing note reviewed.  Constitutional:      General: She is not in acute distress.    Appearance: She is well-developed.  HENT:     Head: Normocephalic and atraumatic.  Eyes:     Conjunctiva/sclera: Conjunctivae normal.  Cardiovascular:     Rate and Rhythm: Normal rate and regular rhythm.     Heart sounds: No murmur heard. Pulmonary:     Effort: Pulmonary effort is normal. No respiratory distress.     Breath sounds: Normal breath sounds.  Abdominal:     Palpations: Abdomen is soft.     Tenderness: There is no abdominal tenderness.  Musculoskeletal:        General: No swelling.     Cervical back: Neck supple.     Right lower leg: Edema present.     Left lower leg: Edema present.  Skin:    General: Skin is warm and dry.     Capillary Refill: Capillary refill takes less than 2 seconds.  Neurological:     Mental Status: She is alert.  Psychiatric:        Mood and Affect: Mood  normal.     ED Results and Treatments Labs (all labs ordered are listed, but only abnormal results are displayed) Labs Reviewed  COMPREHENSIVE METABOLIC PANEL WITH GFR - Abnormal; Notable for the following components:      Result Value   Potassium 3.0 (*)    Glucose, Bld 110 (*)    BUN 28 (*)    Creatinine, Ser 1.61 (*)    Calcium  11.5 (*)    Albumin 2.6 (*)    AST 63 (*)    GFR, Estimated 31 (*)    All other components within normal limits  CBC - Abnormal; Notable for the following components:   WBC 3.4 (*)    Hemoglobin 11.1 (*)    HCT 35.4 (*)    RDW 19.7 (*)    Platelets 119 (*)    All other components within normal limits  PROTIME-INR - Abnormal; Notable for the following components:   Prothrombin Time 16.3 (*)    All other components within normal limits  TROPONIN I (HIGH SENSITIVITY) - Abnormal; Notable for the following components:   Troponin I (High Sensitivity) 238 (*)    All other components within normal limits  URINALYSIS, ROUTINE W REFLEX  MICROSCOPIC  PHOSPHORUS  MAGNESIUM   VITAMIN D  25 HYDROXY (VIT D DEFICIENCY, FRACTURES)  BRAIN NATRIURETIC PEPTIDE  CBG MONITORING, ED  TROPONIN I (HIGH SENSITIVITY)                                                                                                                          Radiology DG Chest Portable 1 View Result Date: 05/18/2024 CLINICAL DATA:  Dyspnea on exertion. EXAM: PORTABLE CHEST - 1 VIEW COMPARISON:  09/26/2023 FINDINGS: Cardiomediastinal silhouette and pulmonary vasculature are within normal limits. Lungs are clear. Dual lead LEFT chest wall pacemaker is unchanged in configuration. RIGHT reversed total shoulder prosthesis is partially visualized. IMPRESSION: No acute cardiopulmonary process. Electronically Signed   By: Aliene Lloyd M.D.   On: 05/18/2024 12:43    Pertinent labs & imaging results that were available during my care of the patient were reviewed by me and considered in my medical decision  making (see MDM for details).  Medications Ordered in ED Medications  acetaminophen  (TYLENOL ) tablet 650 mg (has no administration in time range)    Or  acetaminophen  (TYLENOL ) suppository 650 mg (has no administration in time range)  ondansetron  (ZOFRAN ) tablet 4 mg (has no administration in time range)    Or  ondansetron  (ZOFRAN ) injection 4 mg (has no administration in time range)  potassium chloride  SA (KLOR-CON  M) CR tablet 40 mEq (40 mEq Oral Given 05/18/24 1253)  magnesium  oxide (MAG-OX) tablet 800 mg (800 mg Oral Given 05/18/24 1253)  lactated ringers  bolus 1,000 mL (1,000 mLs Intravenous New Bag/Given 05/18/24 1305)  aspirin  chewable tablet 324 mg (324 mg Oral Given 05/18/24 1253)                                                                                                                                     Procedures .Critical Care  Performed by: Albertina Dixon, MD Authorized by: Albertina Dixon, MD   Critical care provider statement:    Critical care time (minutes):  30   Critical care was necessary to treat or prevent imminent or life-threatening deterioration of the following conditions:  Cardiac failure   Critical care was time spent personally by me on the following activities:  Development of treatment plan with patient or surrogate, discussions with consultants, evaluation of patient's response to treatment, examination of patient, ordering and review of laboratory studies, ordering and review of radiographic studies,  ordering and performing treatments and interventions, pulse oximetry, re-evaluation of patient's condition and review of old charts   (including critical care time)  Medical Decision Making / ED Course   This patient presents to the ED for concern of fatigue, leg swelling, this involves an extensive number of treatment options, and is a complaint that carries with it a high risk of complications and morbidity.  The differential diagnosis includes  electrolyte abnormality, dehydration, dehydration, deconditioning, heart failure  MDM: Patient seen emergency room for evaluation of failure to thrive and outpatient abnormal labs.  Physical exam with lower extremity edema bilaterally 1+ but is otherwise unremarkable.  Laboratory evaluation with leukopenia to 3.4, hemoglobin 11.1, platelets 119, potassium 3.0 which was repleted in the emergency department, BUN 28, creatinine 1.61, albumin 2.6, INR is normal, high-sensitivity troponin significant elevated to 38.  ECG with a paced rhythm unchanged from previous.  Aspirin  given and I spoke with the cardiologist on-call Dr. Barnetta who will have the cardiology team evaluate the patient inpatient.  Likely demand ischemia but we will trend troponins.  Patient will require hospital admission for failure to thrive and NSTEMI.  Patient admitted   Additional history obtained: -Additional history obtained from multiple family members -External records from outside source obtained and reviewed including: Chart review including previous notes, labs, imaging, consultation notes   Lab Tests: -I ordered, reviewed, and interpreted labs.   The pertinent results include:   Labs Reviewed  COMPREHENSIVE METABOLIC PANEL WITH GFR - Abnormal; Notable for the following components:      Result Value   Potassium 3.0 (*)    Glucose, Bld 110 (*)    BUN 28 (*)    Creatinine, Ser 1.61 (*)    Calcium  11.5 (*)    Albumin 2.6 (*)    AST 63 (*)    GFR, Estimated 31 (*)    All other components within normal limits  CBC - Abnormal; Notable for the following components:   WBC 3.4 (*)    Hemoglobin 11.1 (*)    HCT 35.4 (*)    RDW 19.7 (*)    Platelets 119 (*)    All other components within normal limits  PROTIME-INR - Abnormal; Notable for the following components:   Prothrombin Time 16.3 (*)    All other components within normal limits  TROPONIN I (HIGH SENSITIVITY) - Abnormal; Notable for the following components:    Troponin I (High Sensitivity) 238 (*)    All other components within normal limits  URINALYSIS, ROUTINE W REFLEX MICROSCOPIC  PHOSPHORUS  MAGNESIUM   VITAMIN D  25 HYDROXY (VIT D DEFICIENCY, FRACTURES)  BRAIN NATRIURETIC PEPTIDE  CBG MONITORING, ED  TROPONIN I (HIGH SENSITIVITY)      EKG   EKG Interpretation Date/Time:  Tuesday May 18 2024 11:02:48 EDT Ventricular Rate:  61 PR Interval:  86 QRS Duration:  95 QT Interval:  421 QTC Calculation: 424 R Axis:   63  Text Interpretation: a paced rhythm No significant change since last tracing diffuse t wave inversions unchanged from previous Confirmed by Ramon Zanders ([id number]) on 05/18/2024 12:36:01 PM         Imaging Studies ordered: I ordered imaging studies including chest x-ray I independently visualized and interpreted imaging. I agree with the radiologist interpretation   Medicines ordered and prescription drug management: Meds ordered this encounter  Medications   potassium chloride  SA (KLOR-CON  M) CR tablet 40 mEq   magnesium  oxide (MAG-OX) tablet 800 mg   lactated ringers  bolus 1,000  mL   aspirin  chewable tablet 324 mg   OR Linked Order Group    acetaminophen  (TYLENOL ) tablet 650 mg    acetaminophen  (TYLENOL ) suppository 650 mg   OR Linked Order Group    ondansetron  (ZOFRAN ) tablet 4 mg    ondansetron  (ZOFRAN ) injection 4 mg    -I have reviewed the patients home medicines and have made adjustments as needed  Critical interventions Aspirin , cardiology consultation  Consultations Obtained: I requested consultation with the cardiologist on-call,  and discussed lab and imaging findings as well as pertinent plan - they recommend: Inpatient evaluation   Cardiac Monitoring: The patient was maintained on a cardiac monitor.  I personally viewed and interpreted the cardiac monitored which showed an underlying rhythm of: Paced rhythm  Social Determinants of Health:  Factors impacting patients care include: Difficulty  caring for self at home   Reevaluation: After the interventions noted above, I reevaluated the patient and found that they have :stayed the same  Co morbidities that complicate the patient evaluation  Past Medical History:  Diagnosis Date   A-fib (HCC)    Allergy    CAD (coronary artery disease)    sees Dr. Debby Sor  cardiac stents - 2000   CHF (congestive heart failure) (HCC)    Colon polyps    Complication of anesthesia    rash/hives with caines   Dyspnea    02/12/18  when my heart gets out of rhythm, it has not been out of rhythym- since I have been on Tikosyn  (11/2017)   Dysrhythmia    afib fib   GERD (gastroesophageal reflux disease)    takes OTC- Omeprazole - prn   Heart murmur    History of kidney stones    History of stress test    show normal perfusion without scar or ischemia, post EF 68%   Hx of echocardiogram    show an EF 55%-60% range with grade 1 diastolic dysfunction, she had mitral anular calcification with mild MR, moderate LA dilation and mild pulmonary hypertension with a PA estimated pressure of 39mm   Hyperlipidemia    Hypertension    NSTEMI (non-ST elevated myocardial infarction) (HCC)    Osteoarthritis    Pacemaker    Pneumonia    hx of 2015    PONV (postoperative nausea and vomiting)    Sleep apnea       Dispostion: I considered admission for this patient, and patient will require hospital admission for NSTEMI and exertional dyspnea     Final Clinical Impression(s) / ED Diagnoses Final diagnoses:  Hypercalcemia  DOE (dyspnea on exertion)  Hypokalemia     @PCDICTATION @    Albertina Dixon, MD 05/18/24 1400

## 2024-05-18 NOTE — Consult Note (Addendum)
 Cardiology Consultation   Patient ID: Barbara [name] MRN: [id number]; DOB: [birth year]  Admit date: 05/18/2024 Date of Consult: 05/18/2024  PCP:  Johnny Garnette LABOR, MD   [facility] Providers Cardiologist:  Debby Sor, MD  Electrophysiologist:  Soyla Gladis Norton, MD       Patient Profile: Barbara [name] is a 83 y.o. female with a hx of apical hypertrophic cardiomyopathy, CAD, SSS s/p DC PPM, atrial fibrillation, CKD 3b, hypertension, and hyperlipidemis who is being seen 05/18/2024 for the evaluation of edema, weakness, and failure to thrive at the request of Dr. Celinda.  History of Present Illness: Ms. Schiller presents with weakness and swelling.  She has been experiencing weakness for the past month, particularly when trying to get up from a lying position. She mentions difficulty getting into bed due to needing to lift her knee.  She notes significant swelling in her ankle, describing it as 'real swollen'. She is currently taking Eliquis . She describes her leg as looking like a 'funny little foot'.  She denies orthopnea or PND.  She experiences hoarseness, which she attributes to her left vocal cord being affected every time she undergoes surgery or is intubated. She was scheduled for surgery last month, but it was postponed due to elevated blood pressure readings of 165/70.  Lately her blood pressures have been labile and more elevated than usual.  She describes a dry mouth and poor appetite, stating she looks like a 'skeleton'. She drinks water  and Boost, but her appetite remains low. She was recently started on appetite stimulant, which she believes helps her appetite slightly.  No chest pain or pressure, but she reports shortness of breath when trying to move from the couch to the bedroom. She lives alone and has difficulty cooking meals for herself, often standing at the stove or refrigerator for support. She has not eaten all day and is unsure if she  has a diet order in place at the hospital.  Ms. Worthy has a history of apical variant hypertrophic cardiomyopathy.  Cardiac MRI in 2021 revealed apical hypertrophy up to 1.5 cm.  There was patchy LGE at the apex and RV insertion site.  Systolic function was greater than 75% with normal RV function on echo in 2023.  She has lost a considerable amount of weight.  She last saw Dr. Barnetta 04/2024 and reported exertional dyspnea and trouble using her CPAP machine.  Her Lasix  was stopped 03/2024.  In the ED BNP was 852.  It has been as high as 1920 23.  High-sensitivity troponin was 238-->211.  She was encouraged to come to the hospital by family due to failure to thrive and difficulty caring for herself at home as well as her exertional dyspnea and lower extremity edema.  Past Medical History:  Diagnosis Date   A-fib [facility])    Allergy    CAD (coronary artery disease)    sees Dr. Debby Sor  cardiac stents - 2000   CHF (congestive heart failure) (HCC)    Colon polyps    Complication of anesthesia    rash/hives with caines   Dyspnea    02/12/18  when my heart gets out of rhythm, it has not been out of rhythym- since I have been on Tikosyn  (11/2017)   Dysrhythmia    afib fib   GERD (gastroesophageal reflux disease)    takes OTC- Omeprazole - prn   Heart murmur    History of kidney stones    History of stress test  show normal perfusion without scar or ischemia, post EF 68%   Hx of echocardiogram    show an EF 55%-60% range with grade 1 diastolic dysfunction, she had mitral anular calcification with mild MR, moderate LA dilation and mild pulmonary hypertension with a PA estimated pressure of 39mm   Hyperlipidemia    Hypertension    NSTEMI (non-ST elevated myocardial infarction) (HCC)    Osteoarthritis    Pacemaker    Pneumonia    hx of 2015    PONV (postoperative nausea and vomiting)    Sleep apnea     Past Surgical History:  Procedure Laterality Date   CARDIAC CATHETERIZATION      11/2017   cardiac stents  2000   COLONOSCOPY  01-05-14   per Dr. Norleen Hint, clear, no repeats needed    COLONOSCOPY     CORONARY STENT PLACEMENT  2000   in LAD   CYSTOSCOPY WITH RETROGRADE PYELOGRAM, URETEROSCOPY AND STENT PLACEMENT Left 06/11/2022   Procedure: CYSTOSCOPY WITH RETROGRADE PYELOGRAM, LEFT STENT PLACEMENT;  Surgeon: Carolee Sherwood JONETTA DOUGLAS, MD;  Location: [facility]S;  Service: Urology;  Laterality: Left;   CYSTOSCOPY/URETEROSCOPY/HOLMIUM LASER/STENT PLACEMENT Left 06/25/2022   Procedure: CYSTOSCOPY LEFT URETEROSCOPY/HOLMIUM LASER/STENT PLACEMENT;  Surgeon: Watt Norleen, MD;  Location: [facility];  Service: Urology;  Laterality: Left;  1 HR FOR THIS CASE   DIRECT LARYNGOSCOPY WITH RADIAESSE INJECTION N/A 02/13/2018   Procedure: DIRECT LARYNGOSCOPY WITH RADIAESSE INJECTION;  Surgeon: Carlie Clark, MD;  Location: [facility] OR;  Service: ENT;  Laterality: N/A;   ESOPHAGOGASTRODUODENOSCOPY N/A 03/11/2024   Procedure: EGD (ESOPHAGOGASTRODUODENOSCOPY);  Surgeon: Rosalie Kitchens, MD;  Location: [facility] ENDOSCOPY;  Service: Gastroenterology;  Laterality: N/A;   EYE SURGERY Left    CATARACT REMOVAL   HIP ARTHROPLASTY Left 09/27/2023   Procedure: ARTHROPLASTY BIPOLAR HIP (HEMIARTHROPLASTY);  Surgeon: Beverley Evalene JONETTA, MD;  Location[facility] OR;  Service: Orthopedics;  Laterality: Left;   KNEE ARTHROSCOPY Left 01/06/2015   Procedure: LEFT KNEE ARTHROSCOPY, abrasion chondroplasty of the medial femerol condryl,medial and lateral menisectomy, microfracture , synovectomy of the suprpatellar pouch;  Surgeon: Tanda Heading, MD;  Location: [facility];  Service: Orthopedics;  Laterality: Left;   LEFT HEART CATH AND CORONARY ANGIOGRAPHY N/A 02/26/2019   Procedure: LEFT HEART CATH AND CORONARY ANGIOGRAPHY;  Surgeon: Burnard Debby LABOR, MD;  Location: [facility] INVASIVE CV LAB;  Service: Cardiovascular;  Laterality: N/A;   LEFT HEART CATH AND CORONARY ANGIOGRAPHY N/A 05/06/2022   Procedure: LEFT HEART CATH AND CORONARY ANGIOGRAPHY;  Surgeon: Mady Bruckner, MD;  Location: [facility] INV[facility] CV LAB;  Service: Cardiovascular;  Laterality: N/A;   MICROLARYNGOSCOPY W/VOCAL CORD INJECTION N/A 08/07/2018   Procedure: MICROLARYNGOSCOPY WITH VOCAL CORD INJECTION OF PROLARYN;  Surgeon: Carlie Clark, MD;  Location: [facility] OR;  Service: ENT;  Laterality: N/A;  JET VENTILATION   PACEMAKER IMPLANT N/A 05/08/2022   Procedure: PACEMAKER IMPLANT;  Surgeon: Inocencio Soyla Lunger, MD;  Location: [facility] INVAS[facility] CV LAB;  Service: Cardiovascular;  Laterality: N/A;   REVERSE SHOULDER ARTHROPLASTY Right 03/23/2020   Procedure: REVERSE SHOULDER ARTHROPLASTY;  Surgeon: Sharl Selinda Dover, MD;  Location: [facility] OR;  Service: Orthopedics;  Laterality: Right;   VAGINAL HYSTERECTOMY  1971     Home Medications:  Prior to Admission medications   Medication Sig Start Date End Date Taking? Authorizing Provider  alum & mag hydroxide-simeth (MAALOX/MYLANTA) 200-200-20 MG/5ML suspension Take 30 mLs by mouth every 4 (four) hours as needed for indigestion. 09/30/23  Yes Leotis Bogus, MD  apixaban  (ELIQUIS ) 2.5 MG  TABS tablet Take 1 tablet (2.5 mg total) by mouth 2 (two) times daily. 01/21/24  Yes Johnny Garnette LABOR, MD  empagliflozin  (JARDIANCE ) 10 MG TABS tablet Take 1 tablet (10 mg total) by mouth daily. 01/21/24  Yes Johnny Garnette LABOR, MD  furosemide  (LASIX ) 20 MG tablet Take 10 mg by mouth daily as needed (for swelling of the ankles).   Yes [provider]  hydrALAZINE  (APRESOLINE ) 100 MG tablet Take 1.5 tablets (150 mg total) by mouth 2 (two) times daily. 12/26/23  Yes Johnny Garnette LABOR, MD  ipratropium (ATROVENT ) 0.06 % nasal spray Place 2 sprays into both nostrils 2 (two) times daily as needed (allergies). 04/28/18  Yes [provider]  isosorbide  mononitrate (IMDUR ) 30 MG 24 hr tablet Take 1 tablet (30 mg total) by mouth daily. 02/24/24  Yes Johnny Garnette LABOR, MD  ketoconazole  (NIZORAL ) 2 % cream Apply 1 Application topically 2 (two) times daily as needed for irritation. 12/26/23   Yes Johnny Garnette LABOR, MD  loratadine  (CLARITIN ) 10 MG tablet Take 10 mg by mouth daily as needed for allergies.   Yes [provider]  megestrol  (MEGACE ) 40 MG tablet Take 1 tablet (40 mg total) by mouth in the morning. 04/30/24  Yes Johnny Garnette LABOR, MD  metoprolol  succinate (TOPROL -XL) 25 MG 24 hr tablet Take 1 tablet (25 mg total) by mouth daily. Take with or immediately following a meal. 03/11/24  Yes Gonfa, Taye T, MD  mirtazapine  (REMERON ) 15 MG tablet Take 1 tablet (15 mg total) by mouth at bedtime. 05/04/24  Yes Johnny Garnette LABOR, MD  nitroGLYCERIN  (NITROSTAT ) 0.4 MG SL tablet Place 1 tablet (0.4 mg total) under the tongue every 5 (five) minutes as needed for chest pain. 04/06/24  Yes Johnny Garnette LABOR, MD  Nutritional Supplements (BOOST/FIBER PO) Take 1 Bottle by mouth 3 (three) times daily.   Yes [provider]  pantoprazole  (PROTONIX ) 40 MG tablet Take 1 tablet (40 mg total) by mouth daily. Patient taking differently: Take 40 mg by mouth daily as needed (for heartburn). 02/13/24 02/13/25 Yes Johnny Garnette LABOR, MD  triamcinolone  (NASACORT) 55 MCG/ACT nasal inhaler Place 1 spray into both nostrils daily as needed (for allergies).  10/17/12  Yes Johnny Garnette LABOR, MD  Cholecalciferol  (VITAMIN D3) 2000 units TABS Take 2,000 Units by mouth in the morning.    [provider]  LORazepam  (ATIVAN ) 0.5 MG tablet TAKE 1 TABLET BY MOUTH EVERY 6 HOURS AS NEEDED FOR ANXIETY Patient not taking: Reported on 05/18/2024 12/23/23   Johnny Garnette LABOR, MD  rosuvastatin  (CRESTOR ) 40 MG tablet Take 1 tablet by mouth once daily Patient not taking: Reported on 05/18/2024 08/22/23   Burnard Debby LABOR, MD    Scheduled Meds:  apixaban   2.5 mg Oral BID   [START ON 05/19/2024] empagliflozin   10 mg Oral Daily   furosemide   20 mg Intravenous Daily   isosorbide  mononitrate  30 mg Oral Daily   [START ON 05/19/2024] megestrol   40 mg Oral Daily   metoprolol  succinate  25 mg Oral Daily   phosphorus  250 mg Oral TID    potassium chloride   40 mEq Oral Once   Continuous Infusions:  magnesium  sulfate bolus IVPB     PRN Meds: acetaminophen  **OR** acetaminophen , ondansetron  **OR** ondansetron  (ZOFRAN ) IV, perflutren  lipid microspheres (DEFINITY ) IV suspension  Allergies:    Allergies  Allergen Reactions   Lidocaine  Anaphylaxis   Morphine  Other (See Comments)    Body shuts down   Procaine Hcl Anaphylaxis,  Rash and Other (See Comments)    Anything with 'caine' in it    Sulfonamide Derivatives Hives   Amlodipine  Swelling   Ezetimibe-Simvastatin Other (See Comments)    VYTORIN = Joint pain   Latex Itching and Other (See Comments)    WELTS   Norco [Hydrocodone -Acetaminophen ] Nausea And Vomiting and Other (See Comments)    States does ok with IV form    Tramadol  Nausea And Vomiting   Tape Itching and Other (See Comments)    Patient prefers either paper tape or Coban wrap    Social History:   Social History   Socioeconomic History   Marital status: Widowed    Spouse name: Not on file   Number of children: 2   Years of education: college   Highest education level: High school graduate  Occupational History   Occupation: Retired  Tobacco Use   Smoking status: Former    Current packs/day: 0.00    Types: Cigarettes    Start date: 11/04/1956    Quit date: 11/04/1996    Years since quitting: 27.5   Smokeless tobacco: Never  Vaping Use   Vaping status: Never Used  Substance and Sexual Activity   Alcohol use: No    Alcohol/week: 0.0 standard drinks of alcohol   Drug use: No   Sexual activity: Not Currently  Other Topics Concern   Not on file  Social History Narrative   Not on file   Social Drivers of Health   Financial Resource Strain: Low Risk  (08/05/2022)   Overall Financial Resource Strain (CARDIA)    Difficulty of Paying Living Expenses: Not hard at all  Food Insecurity: No Food Insecurity (04/05/2024)   Hunger Vital Sign    Worried About Running Out of Food in the Last Year: Never  true    Ran Out of Food in the Last Year: Never true  Transportation Needs: No Transportation Needs (04/05/2024)   PRAPARE - Administrator, Civil Service (Medical): No    Lack of Transportation (Non-Medical): No  Physical Activity: Insufficiently Active (07/02/2022)   Exercise Vital Sign    Days of Exercise per Week: 1 day    Minutes of Exercise per Session: 20 min  Stress: No Stress Concern Present (07/02/2022)   [employer] of Occupational Health - Occupational Stress Questionnaire    Feeling of Stress : Not at all  Social Connections: Moderately Integrated (03/10/2024)   Social Connection and Isolation Panel    Frequency of Communication with Friends and Family: More than three times a week    Frequency of Social Gatherings with Friends and Family: More than three times a week    Attends Religious Services: More than 4 times per year    Active Member of [employer] or Organizations: Yes    Attends Banker Meetings: More than 4 times per year    Marital Status: Widowed  Intimate Partner Violence: Not At Risk (04/05/2024)   Humiliation, Afraid, Rape, and Kick questionnaire    Fear of Current or Ex-Partner: No    Emotionally Abused: No    Physically Abused: No    Sexually Abused: No    Family History:    Family History  Problem Relation Age of Onset   Cancer Maternal Grandmother    Heart disease Maternal Grandfather      ROS:  Please see the history of present illness.   All other ROS reviewed and negative.     Physical Exam/Data: Vitals:   05/18/24  1100 05/18/24 1115 05/18/24 1130 05/18/24 1451  BP: (!) 115/52 (!) 118/53 (!) 125/47 (!) 145/64  Pulse:  (!) 59 (!) 59 65  Resp:  19 16 18   Temp:    97.6 F (36.4 C)  TempSrc:    Oral  SpO2:  100% 100% 100%  Weight:      Height:       No intake or output data in the 24 hours ending 05/18/24 1653    05/18/2024   10:45 AM 05/17/2024    4:01 PM 05/04/2024    2:39 PM  Last 3 Weights  Weight (lbs) 125  lb 124 lb 6.4 oz 119 lb  Weight (kg) 56.7 kg 56.427 kg 53.978 kg     VS:  BP (!) 145/64 (BP Location: Right Arm)   Pulse 65   Temp 97.6 F (36.4 C) (Oral)   Resp 18   Ht 5' 7 (1.702 m)   Wt 56.7 kg   SpO2 100%   BMI 19.58 kg/m  , BMI Body mass index is 19.58 kg/m. GENERAL: Frail.  No acute distress. HEENT: Pupils equal round and reactive, fundi not visualized, oral mucosa unremarkable NECK:  No jugular venous distention, waveform within normal limits, carotid upstroke brisk and symmetric, no bruits, no thyromegaly LUNGS:  Clear to auscultation bilaterally HEART:  RRR.  PMI not displaced or sustained,S1 and S2 within normal limits, no S3, no S4, no clicks, no rubs, no murmurs ABD:  Flat, positive bowel sounds normal in frequency in pitch, no bruits, no rebound, no guarding, no midline pulsatile mass, no hepatomegaly, no splenomegaly EXT:  2 plus pulses throughout, L>R lower extremity edema, no cyanosis no clubbing SKIN:  No rashes no nodules NEURO:  Cranial nerves II through XII grossly intact, motor grossly intact throughout PSYCH:  Cognitively intact, oriented to person place and time  EKG:  The EKG was personally reviewed and demonstrates: A paced, V sensed.  Rate 61 bpm. Telemetry:  Telemetry was personally reviewed and demonstrates: A paced V sensed  Relevant CV Studies: Echo 05/2022: 1. Left ventricular ejection fraction, by estimation, is >75%. The left  ventricle has hyperdynamic function. The left ventricle has no regional  wall motion abnormalities. Left ventricular diastolic parameters are  indeterminate. Elevated left atrial  pressure.   2. Right ventricular systolic function is normal. The right ventricular  size is normal. There is mildly elevated pulmonary artery systolic  pressure.   3. Left atrial size was moderately dilated.   4. The mitral valve is normal in structure. Moderate mitral valve  regurgitation. No evidence of mitral stenosis.   5. The aortic  valve is tricuspid. Aortic valve regurgitation is not  visualized. Aortic valve sclerosis is present, with no evidence of aortic  valve stenosis.   6. The inferior vena cava is normal in size with greater than 50%  respiratory variability, suggesting right atrial pressure of 3 mmHg.   LHC 05/06/22: Conclusions: Mild-moderate, non-obstructive coronary artery disease. Patent proximal/mid LAD stent with up to 30% in-stent restenosis. Low left ventricular filling pressure (LVEDP ~5 mmHg).   Recommendations: Continue secondary prevention of coronary artery disease. Proceed with pacemaker placement per Dr. Fernande.  Laboratory Data: High Sensitivity Troponin:   Recent Labs  Lab 05/18/24 1132 05/18/24 1427  TROPONINIHS 238* 211*     Chemistry Recent Labs  Lab 05/18/24 1132  NA 138  K 3.0*  CL 104  CO2 25  GLUCOSE 110*  BUN 28*  CREATININE 1.61*  CALCIUM  11.5*  MG 1.8  GFRNONAA 31*  ANIONGAP 9    Recent Labs  Lab 05/18/24 1132  PROT 7.2  ALBUMIN 2.6*  AST 63*  ALT 25  ALKPHOS 67  BILITOT 0.7   Lipids No results for input(s): CHOL, TRIG, HDL, LABVLDL, LDLCALC, CHOLHDL in the last 168 hours.  Hematology Recent Labs  Lab 05/18/24 1132  WBC 3.4*  RBC 3.95  HGB 11.1*  HCT 35.4*  MCV 89.6  MCH 28.1  MCHC 31.4  RDW 19.7*  PLT 119*   Thyroid  No results for input(s): TSH, FREET4 in the last 168 hours.  BNP Recent Labs  Lab 05/18/24 1132  BNP 852.3*    DDimer No results for input(s): DDIMER in the last 168 hours.  Radiology/Studies:  DG Chest Portable 1 View Result Date: 05/18/2024 CLINICAL DATA:  Dyspnea on exertion. EXAM: PORTABLE CHEST - 1 VIEW COMPARISON:  09/26/2023 FINDINGS: Cardiomediastinal silhouette and pulmonary vasculature are within normal limits. Lungs are clear. Dual lead LEFT chest wall pacemaker is unchanged in configuration. RIGHT reversed total shoulder prosthesis is partially visualized. IMPRESSION: No acute cardiopulmonary  process. Electronically Signed   By: Aliene Lloyd M.D.   On: 05/18/2024 12:43     Assessment and Plan:  # Heart failure with preserved ejection fraction # Apical HCM: Echocardiogram this admission shows normal systolic function, no significant fluid overload. RA Pressure 3 mmHg.  Gentle lasix  is OK, but avoid aggressive diuresis.a.  Continue Jardiance  and metoprolol .    # Swelling of ankle Left ankle swelling more than right. Echocardiogram suggests heart failure not primary cause. Possible venous insufficiency.  DVT less likely given that she is on Eliquis . - Order venous Doppler of the leg to rule out deep vein thrombosis.  # Elevated troponin:  Flat.  No exertional CP.  Systolic function normal on echo.  Likely demand ischemia.  No ischemia evaluation indicated at this time.   # Hypertension Blood pressure of 165/107, impacting surgical scheduling.  Here is is ranging 110s-140s.  Continue to monitor.   # PAF: She is currently atrial paced.  Continue metoprolol  and ELiquis .   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  # Weight loss Significant weight loss due to decreased appetite and oral intake.  Continue Boost and megestrol .   # Dry mouth Xerostomia and lip dryness, possibly related to decreased oral intake. - Provide lip balm for lip dryness.   Risk Assessment/Risk Scores:       New [place]  Heart Association (NYHA) Functional Class NYHA Class III  CHA2DS2-VASc Score = 5   This indicates a 7.2% annual risk of stroke. The patient's score is based upon: CHF History: 0 HTN History: 1 Diabetes History: 0 Stroke History: 0 Vascular Disease History: 1 Age Score: 2 Gender Score: 1        For questions or updates, please contact [facility] Please consult [email or web address] for contact info under    Signed, Annabella Scarce, MD  05/18/2024 4:53 PM

## 2024-05-18 NOTE — Progress Notes (Signed)
   05/18/24 2218  BiPAP/CPAP/SIPAP  BiPAP/CPAP/SIPAP Pt Type Adult  BiPAP/CPAP/SIPAP Resmed  Mask Type Nasal pillows  Dentures removed? Not applicable  Patient Home Machine Yes  Safety Check Completed by RT for Home Unit Yes, no issues noted  Patient Home Mask Yes  Patient Home Tubing Yes  Auto Titrate Yes  BiPAP/CPAP /SiPAP Vitals  Pulse Rate 66  Resp 15  BP (!) 133/55  SpO2 98 %  Bilateral Breath Sounds Clear;Diminished  MEWS Score/Color  MEWS Score 0  MEWS Score Color Green

## 2024-05-19 ENCOUNTER — Observation Stay (HOSPITAL_BASED_OUTPATIENT_CLINIC_OR_DEPARTMENT_OTHER)

## 2024-05-19 DIAGNOSIS — I2510 Atherosclerotic heart disease of native coronary artery without angina pectoris: Secondary | ICD-10-CM | POA: Diagnosis not present

## 2024-05-19 DIAGNOSIS — I10 Essential (primary) hypertension: Secondary | ICD-10-CM | POA: Diagnosis not present

## 2024-05-19 DIAGNOSIS — K219 Gastro-esophageal reflux disease without esophagitis: Secondary | ICD-10-CM | POA: Diagnosis not present

## 2024-05-19 DIAGNOSIS — E440 Moderate protein-calorie malnutrition: Secondary | ICD-10-CM

## 2024-05-19 DIAGNOSIS — I480 Paroxysmal atrial fibrillation: Secondary | ICD-10-CM

## 2024-05-19 DIAGNOSIS — N1832 Chronic kidney disease, stage 3b: Secondary | ICD-10-CM

## 2024-05-19 DIAGNOSIS — E7800 Pure hypercholesterolemia, unspecified: Secondary | ICD-10-CM

## 2024-05-19 DIAGNOSIS — I509 Heart failure, unspecified: Secondary | ICD-10-CM | POA: Diagnosis not present

## 2024-05-19 DIAGNOSIS — R627 Adult failure to thrive: Secondary | ICD-10-CM | POA: Diagnosis not present

## 2024-05-19 DIAGNOSIS — G4733 Obstructive sleep apnea (adult) (pediatric): Secondary | ICD-10-CM

## 2024-05-19 DIAGNOSIS — R7989 Other specified abnormal findings of blood chemistry: Secondary | ICD-10-CM | POA: Diagnosis not present

## 2024-05-19 DIAGNOSIS — R609 Edema, unspecified: Secondary | ICD-10-CM | POA: Diagnosis not present

## 2024-05-19 DIAGNOSIS — D61818 Other pancytopenia: Secondary | ICD-10-CM

## 2024-05-19 DIAGNOSIS — E876 Hypokalemia: Secondary | ICD-10-CM

## 2024-05-19 DIAGNOSIS — I5033 Acute on chronic diastolic (congestive) heart failure: Secondary | ICD-10-CM

## 2024-05-19 LAB — CBC
HCT: 32.9 % — ABNORMAL LOW (ref 36.0–46.0)
Hemoglobin: 10.3 g/dL — ABNORMAL LOW (ref 12.0–15.0)
MCH: 28.4 pg (ref 26.0–34.0)
MCHC: 31.3 g/dL (ref 30.0–36.0)
MCV: 90.6 fL (ref 80.0–100.0)
Platelets: 111 K/uL — ABNORMAL LOW (ref 150–400)
RBC: 3.63 MIL/uL — ABNORMAL LOW (ref 3.87–5.11)
RDW: 19.8 % — ABNORMAL HIGH (ref 11.5–15.5)
WBC: 2.8 K/uL — ABNORMAL LOW (ref 4.0–10.5)
nRBC: 0 % (ref 0.0–0.2)

## 2024-05-19 LAB — COMPREHENSIVE METABOLIC PANEL WITH GFR
ALT: 24 U/L (ref 0–44)
AST: 58 U/L — ABNORMAL HIGH (ref 15–41)
Albumin: 2.5 g/dL — ABNORMAL LOW (ref 3.5–5.0)
Alkaline Phosphatase: 74 U/L (ref 38–126)
Anion gap: 7 (ref 5–15)
BUN: 29 mg/dL — ABNORMAL HIGH (ref 8–23)
CO2: 25 mmol/L (ref 22–32)
Calcium: 10.8 mg/dL — ABNORMAL HIGH (ref 8.9–10.3)
Chloride: 103 mmol/L (ref 98–111)
Creatinine, Ser: 1.82 mg/dL — ABNORMAL HIGH (ref 0.44–1.00)
GFR, Estimated: 27 mL/min — ABNORMAL LOW (ref >60)
Glucose, Bld: 107 mg/dL — ABNORMAL HIGH (ref 70–99)
Potassium: 3.5 mmol/L (ref 3.5–5.1)
Sodium: 135 mmol/L (ref 135–145)
Total Bilirubin: 0.7 mg/dL (ref 0.0–1.2)
Total Protein: 6.5 g/dL (ref 6.5–8.1)

## 2024-05-19 LAB — PHOSPHORUS: Phosphorus: 2.1 mg/dL — ABNORMAL LOW (ref 2.5–4.6)

## 2024-05-19 MED ORDER — POLYETHYLENE GLYCOL 3350 17 G PO PACK
17.0000 g | PACK | Freq: Two times a day (BID) | ORAL | Status: DC
  Administered 2024-05-20 – 2024-05-23 (×5): 17 g via ORAL
  Filled 2024-05-19 (×8): qty 1

## 2024-05-19 MED ORDER — POTASSIUM CHLORIDE CRYS ER 20 MEQ PO TBCR
20.0000 meq | EXTENDED_RELEASE_TABLET | Freq: Once | ORAL | Status: AC
  Administered 2024-05-19: 20 meq via ORAL
  Filled 2024-05-19: qty 1

## 2024-05-19 MED ORDER — POTASSIUM PHOSPHATES 15 MMOLE/5ML IV SOLN
30.0000 mmol | Freq: Once | INTRAVENOUS | Status: AC
  Administered 2024-05-19: 30 mmol via INTRAVENOUS
  Filled 2024-05-19: qty 10

## 2024-05-19 MED ORDER — SENNOSIDES-DOCUSATE SODIUM 8.6-50 MG PO TABS
1.0000 | ORAL_TABLET | Freq: Two times a day (BID) | ORAL | Status: DC
  Administered 2024-05-20 – 2024-05-22 (×5): 1 via ORAL
  Filled 2024-05-19 (×10): qty 1

## 2024-05-19 NOTE — Evaluation (Signed)
 Physical Therapy Evaluation Patient Details Name: Barbara [name] MRN: [id number] DOB: 06-08-1941 Today's Date: 05/19/2024  History of Present Illness  83 y.o. female  who was sent to the emergency department by recommendation of her primary care provider due to exertional dyspnea, lower extremity edema, failure to thrive with poor appetite and muscle pains. Dx of CHF, malnutrition, FTT. Pt with medical history significant of bilateral saphenous ablation, CAD, NSTEMI, chronic diastolic CHF heart murmur, pacemaker placement, dyspnea, hypertension, hyperlipidemia, colon polyps, GERD, nephrolithiasis, osteoarthritis, history of pneumonia, sleep apnea, hypercalcemia  Clinical Impression  Pt admitted with above diagnosis. Mod assist for bed mobility, mod assist for sit to stand, min assist to take a few pivotal steps to bedside commode. Pt was somewhat lethargic but did follow commands and respond to questions. Depending on progress and level of assistance available, she may benefit from post acute rehab. Pt currently with functional limitations due to the deficits listed below (see PT Problem List). Pt will benefit from acute skilled PT to increase their independence and safety with mobility to allow discharge.           If plan is discharge home, recommend the following: A lot of help with walking and/or transfers;A lot of help with bathing/dressing/bathroom;Assistance with cooking/housework;Assist for transportation;Help with stairs or ramp for entrance   Can travel by private vehicle   No    Equipment Recommendations None recommended by PT  Recommendations for Other Services       Functional Status Assessment Patient has had a recent decline in their functional status and demonstrates the ability to make significant improvements in function in a reasonable and predictable amount of time.     Precautions / Restrictions Precautions Precautions: Fall Recall of Precautions/Restrictions:  Intact      Mobility  Bed Mobility Overal bed mobility: Needs Assistance Bed Mobility: Rolling, Sidelying to Sit Rolling: Min assist Sidelying to sit: Mod assist       General bed mobility comments: assist to initiate roll and to raise trunk    Transfers Overall transfer level: Needs assistance Equipment used: Rolling walker (2 wheels) Transfers: Sit to/from Stand, Bed to chair/wheelchair/BSC Sit to Stand: Mod assist, From elevated surface   Step pivot transfers: Min assist       General transfer comment: assist to power up, VCs hand placement, assist to control descent; bed to [facility] transfer    Ambulation/Gait                  Stairs            Wheelchair Mobility     Tilt Bed    Modified Rankin (Stroke Patients Only)       Balance Overall balance assessment: Needs assistance Sitting-balance support: Feet supported Sitting balance-Leahy Scale: Fair     Standing balance support: Bilateral upper extremity supported, Reliant on assistive device for balance, During functional activity Standing balance-Leahy Scale: Poor                               Pertinent Vitals/Pain Pain Assessment Pain Assessment: No/denies pain    Home Living Family/patient expects to be discharged to:: Private residence Living Arrangements: Alone Available Help at Discharge: Family;Available PRN/intermittently Type of Home: House Home Access: Stairs to enter Entrance Stairs-Rails: Right Entrance Stairs-Number of Steps: 2   Home Layout: One level Home Equipment: Rolling Walker (2 wheels);Grab bars - toilet;Grab bars - tub/shower;Hand held shower head;Shower seat;Adaptive  equipment;Rollator (4 wheels);Cane - quad Additional Comments: church friends can assist some    Prior Function Prior Level of Function : Independent/Modified Independent             Mobility Comments: RW in home, small quad cane in community. Upright RW for church ADLs Comments:  sits to shower     Extremity/Trunk Assessment   Upper Extremity Assessment Upper Extremity Assessment: Defer to OT evaluation    Lower Extremity Assessment Lower Extremity Assessment: Generalized weakness;RLE deficits/detail;LLE deficits/detail RLE Deficits / Details: knee ext -4/5 RLE Sensation: WNL LLE Deficits / Details: knee ext -4/5 LLE Sensation: WNL    Cervical / Trunk Assessment Cervical / Trunk Assessment: Kyphotic  Communication   Communication Communication: No apparent difficulties    Cognition Arousal: Lethargic Behavior During Therapy: WFL for tasks assessed/performed   PT - Cognitive impairments: Difficult to assess Difficult to assess due to: Level of arousal                       Following commands: Intact       Cueing Cueing Techniques: Verbal cues     General Comments      Exercises     Assessment/Plan    PT Assessment Patient needs continued PT services  PT Problem List Decreased strength;Decreased activity tolerance;Decreased balance;Decreased mobility       PT Treatment Interventions Functional mobility training;Therapeutic activities;Gait training;Balance training;Patient/family education    PT Goals (Current goals can be found in the Care Plan section)  Acute Rehab PT Goals Patient Stated Goal: to get strength back PT Goal Formulation: With patient Time For Goal Achievement: 06/02/24 Potential to Achieve Goals: Good    Frequency Min 2X/week     Co-evaluation               AM-PAC PT 6 Clicks Mobility  Outcome Measure Help needed turning from your back to your side while in a flat bed without using bedrails?: A Little Help needed moving from lying on your back to sitting on the side of a flat bed without using bedrails?: A Lot Help needed moving to and from a bed to a chair (including a wheelchair)?: A Little Help needed standing up from a chair using your arms (e.g., wheelchair or bedside chair)?: A  Lot Help needed to walk in hospital room?: A Lot Help needed climbing 3-5 steps with a railing? : Total 6 Click Score: 13    End of Session Equipment Utilized During Treatment: Gait belt Activity Tolerance: Patient tolerated treatment well;Patient limited by fatigue Patient left: Other (comment);with call bell/phone within reach (on bedside commode) Nurse Communication: Mobility status PT Visit Diagnosis: Unsteadiness on feet (R26.81);Muscle weakness (generalized) (M62.81);Difficulty in walking, not elsewhere classified (R26.2)    Time: 8764-8744 PT Time Calculation (min) (ACUTE ONLY): 20 min   Charges:   PT Evaluation $PT Eval Moderate Complexity: 1 Mod   PT General Charges $$ ACUTE PT VISIT: 1 Visit         Sylvan Delon Copp PT 05/19/2024  Acute Rehabilitation Services  Office [phone]

## 2024-05-19 NOTE — Progress Notes (Signed)
 PROGRESS NOTE    JESSAMINE BARCIA  FMW:[id number] DOB: January 30, 1941 DOA: 05/18/2024 PCP: Johnny Garnette LABOR, MD    Chief Complaint  Patient presents with   Leg Swelling    Brief Narrative:  Patient 83 year old female history of bilateral saphenous ablation, CAD, non-STEMI, chronic diastolic CHF, heart murmur, status post PPM, dyspnea, hypertension, hyperlipidemia, colonic polyps, GERD, nephrolithiasis, OA, hypercalcemia presented to the ED from PCPs office due to exertional dyspnea, lower extremity edema, failure to thrive with poor oral intake and muscle pain.  Chest x-ray done with no significant abnormalities noted.  Patient noted to have elevated troponin and admitted due to concerns for acute on chronic diastolic CHF and placed on IV Lasix .  Cardiology consulted and following.   Assessment & Plan:   Principal Problem:   Failure to thrive in adult Active Problems:   Abnormal weight loss   CAD (coronary artery disease/elevated troponin   PAF (paroxysmal atrial fibrillation) (HCC)   Essential hypertension   CKD (chronic kidney disease), stage III (HCC)   Hyperlipidemia   Gastroesophageal reflux disease   OSA (obstructive sleep apnea)   Hypercalcemia   Loss of appetite   Elevated troponin   Acute on chronic diastolic congestive heart failure (HCC)   Hypophosphatemia   Hypokalemia   Moderate protein malnutrition (HCC)   Pancytopenia (HCC)  #1 failure to thrive/moderate protein calorie malnutrition -??  Etiology. - May be secondary to persistent hypercalcemia. - Continue Megace , mirtazapine . - PT/OT. - SLP consulted and following.  2.  Acute on chronic diastolic CHF exacerbation/elevated troponin - Patient presented with dyspnea on exertion, some lower extremity edema. - 2D echo obtained with systolic left ventricular mid cavity obliteration with apical entrapment, no LV outflow obstruction, EF > 75%.  Severe asymmetric LVH of the apical segment.  Grade 2 diastolic  dysfunction.  Severely dilated left atrial size.  Mild MVR. - Patient noted to have been placed on Lasix  20 mg IV daily with a urine output of 1.850 L over the past 24 hours. -Patient seen in consultation by cardiology IV Lasix  have been discontinued and recommending Lasix  10 to 20 mg daily as needed for edema or shortness of breath. -Continue Jardiance , metoprolol , Imdur . - Per cardiology elevated troponin likely secondary to demand ischemia and no ischemic evaluation indicated at this time.  3.  Left ankle swelling -2D echo with systolic left ventricular mid cavity obliteration with apical entrapment, no LV outflow obstruction, EF > 75%.  Severe asymmetric LVH of the apical segment.  Grade 2 diastolic dysfunction.  Severely dilated left atrial size.  Mild MVR. -Lower extremity Dopplers done negative for DVT. -Compression stockings, keep lower extremities elevated.  4.  Hypertension  - Continue metoprolol , Imdur .  5.  Paroxysmal A-fib/PPM -Currently atrial paced. - Continue metoprolol  for rate control, Eliquis  for anticoagulation.  6.  Hypercalcemia - Noted to be chronic in nature. - Patient with negative workup for gammopathy. - Dr. Celinda discussed with Dr.Pasam who noted to have seen patient last month and recommended consideration for repeat chest CT to look for lymphadenopathy or sarcoidosis - Continue treatment of constipation. - Patient noted to have received lactulose .  7.  Hypophosphatemia -K-Phos 30 mmol IV x 1.  8.  Hypokalemia -Replete.  9.  Pancytopenia -Dr. Celinda, discussed with Dr.Pasam of hematology/oncology who felt may be due to age or early MDS. - May need to consider IR bone marrow biopsy.  10.  CKD stage IIIb -Patient with slight bump in creatinine, IV diuresis held.  11.  Hyperlipidemia -Statin on hold due to body aches.  12.  GERD -PPI.  13.  OSA -CPAP nightly.    DVT prophylaxis: Eliquis  Code Status: Full Family Communication: Updated  patient.  No family at bedside. Disposition: TBD  Status is: Observation The patient remains OBS appropriate and will d/c before 2 midnights.   Consultants:  Cardiology: Dr. Raford 05/18/2024  Procedures:  2D echo 05/18/2024 Chest x-ray 05/18/2024   Antimicrobials:  Anti-infectives (From admission, onward)    None         Subjective: Patient lying in bed.  States breathing was fine yesterday however feels a little short of breath today.  Noted to have some hoarseness.  Denies any chest pain.  No abdominal pain.  Still with some left ankle swelling per patient.  Objective: Vitals:   05/19/24 0608 05/19/24 0831 05/19/24 1237 05/19/24 2004  BP: (!) 141/53  (!) 120/48 (!) 128/56  Pulse: 61  64 63  Resp: 17  20 14   Temp: 98.8 F (37.1 C)  97.8 F (36.6 C) 98.3 F (36.8 C)  TempSrc: Oral  Oral Oral  SpO2: 97%  99% 96%  Weight:  54.4 kg    Height:  5' 7 (1.702 m)      Intake/Output Summary (Last 24 hours) at 05/19/2024 2046 Last data filed at 05/19/2024 1700 Gross per 24 hour  Intake 720 ml  Output 2300 ml  Net -1580 ml   Filed Weights   05/18/24 1045 05/18/24 1451 05/19/24 0831  Weight: 56.7 kg 54.9 kg 54.4 kg    Examination:  General exam: Appears calm and comfortable  Respiratory system: Clear to auscultation. Respiratory effort normal. Cardiovascular system: RRR. 3/6 SEM.No JVD, murmurs, rubs, gallops or clicks. No pedal edema. Gastrointestinal system: Abdomen is nondistended, soft and nontender. No organomegaly or masses felt. Normal bowel sounds heard. Central nervous system: Alert and oriented. No focal neurological deficits. Extremities: Symmetric 5 x 5 power. Skin: No rashes, lesions or ulcers Psychiatry: Judgement and insight appear normal. Mood & affect appropriate.     Data Reviewed: I have personally reviewed following labs and imaging studies  CBC: Recent Labs  Lab 05/18/24 1132 05/19/24 0416  WBC 3.4* 2.8*  HGB 11.1* 10.3*  HCT 35.4*  32.9*  MCV 89.6 90.6  PLT 119* 111*    Basic Metabolic Panel: Recent Labs  Lab 05/18/24 1132 05/18/24 1427 05/19/24 0416  NA 138  --  135  K 3.0*  --  3.5  CL 104  --  103  CO2 25  --  25  GLUCOSE 110*  --  107*  BUN 28*  --  29*  CREATININE 1.61*  --  1.82*  CALCIUM  11.5*  --  10.8*  MG 1.8  --   --   PHOS  --  2.0* 2.1*    GFR: Estimated Creatinine Clearance: 19.4 mL/min (A) (by C-G formula based on SCr of 1.82 mg/dL (H)).  Liver Function Tests: Recent Labs  Lab 05/18/24 1132 05/19/24 0416  AST 63* 58*  ALT 25 24  ALKPHOS 67 74  BILITOT 0.7 0.7  PROT 7.2 6.5  ALBUMIN 2.6* 2.5*    CBG: Recent Labs  Lab 05/18/24 1114  GLUCAP 98     No results found for this or any previous visit (from the past 240 hours).       Radiology Studies: VAS US  LOWER EXTREMITY VENOUS (DVT) Result Date: 05/19/2024  Lower Venous DVT Study Patient Name:  Mackena M Obarr  Date of Exam:   05/19/2024 Medical Rec #: [id number]               Accession #:    [id number] Date of Birth: November 08, 1940               Patient Gender: F Patient Age:   61 years Exam Location:  [facility] Procedure:      VAS US  LOWER EXTREMITY VENOUS (DVT) Referring Phys: TIFFANY [place] --------------------------------------------------------------------------------  Indications: Edema.  Limitations: Pt is unable to position leg that is adequate for optimal imaging. Comparison Study: No prior Performing Technologist: Elmarie Lindau, RVT  Examination Guidelines: A complete evaluation includes B-mode imaging, spectral Doppler, color Doppler, and power Doppler as needed of all accessible portions of each vessel. Bilateral testing is considered an integral part of a complete examination. Limited examinations for reoccurring indications may be performed as noted. The reflux portion of the exam is performed with the patient in reverse Trendelenburg.   +-----+---------------+---------+-----------+----------+---------------+ RIGHTCompressibilityPhasicitySpontaneityPropertiesThrombus Aging  +-----+---------------+---------+-----------+----------+---------------+ CFV  Full           Yes      Yes                  thickened walls +-----+---------------+---------+-----------+----------+---------------+   +---------+---------------+---------+-----------+----------+--------------+ LEFT     CompressibilityPhasicitySpontaneityPropertiesThrombus Aging +---------+---------------+---------+-----------+----------+--------------+ CFV      Full           Yes      Yes                                 +---------+---------------+---------+-----------+----------+--------------+ SFJ      Full                                                        +---------+---------------+---------+-----------+----------+--------------+ FV Prox  Full                                                        +---------+---------------+---------+-----------+----------+--------------+ FV Mid   Full                                                        +---------+---------------+---------+-----------+----------+--------------+ FV DistalFull                                                        +---------+---------------+---------+-----------+----------+--------------+ PFV      Full                                                        +---------+---------------+---------+-----------+----------+--------------+ POP      Full  Yes      Yes                                 +---------+---------------+---------+-----------+----------+--------------+ PTV      Full                                                        +---------+---------------+---------+-----------+----------+--------------+ PERO     Full                                                         +---------+---------------+---------+-----------+----------+--------------+     Summary: RIGHT: - No evidence of common femoral vein obstruction.  - No cystic structure found in the popliteal fossa.  LEFT: - There is no evidence of deep vein thrombosis in the lower extremity.  - No cystic structure found in the popliteal fossa.  *See table(s) above for measurements and observations. Electronically signed by Gaile New MD on 05/19/2024 at 5:12:14 PM.    Final    ECHOCARDIOGRAM COMPLETE Result Date: 05/18/2024    ECHOCARDIOGRAM REPORT   Patient Name:   OMAH DEWALT [facility] Date of Exam: 05/18/2024 Medical Rec #:  [id number]              Height:       67.0 in Accession #:    [id number]             Weight:       125.0 lb Date of Birth:  February 20, 1941              BSA:          1.656 m Patient Age:    83 years               BP:           125/47 mmHg Patient Gender: F                      HR:           62 bpm. Exam Location:  Inpatient Procedure: 2D Echo, Cardiac Doppler, Color Doppler and Intracardiac            Opacification Agent (Both Spectral and Color Flow Doppler were            utilized during procedure). Indications:    Elevated Troponin  History:        Patient has prior history of Echocardiogram examinations. Risk                 Factors:Hypertension.  Sonographer:    Vella Key Referring Phys: [id number] DAVID MANUEL ORTIZ IMPRESSIONS  1. There is systolic left ventricular mid-cavity obliteration with apical entrapment. There is no LV outflow obstruction. Left ventricular ejection fraction, by estimation, is >75%. The left ventricle has hyperdynamic function. The left ventricle has no  regional wall motion abnormalities. There is severe asymmetric left ventricular hypertrophy of the apical segment. Left ventricular diastolic parameters are consistent with Grade II diastolic dysfunction (pseudonormalization). Elevated left atrial pressure.  2. Right ventricular systolic function is normal. The right ventricular  size is normal. There is normal pulmonary artery systolic pressure. The estimated right ventricular systolic pressure is 30.8 mmHg.  3. Left atrial size was severely dilated.  4. The mitral valve is normal in structure. Mild mitral valve regurgitation. No evidence of mitral stenosis.  5. The aortic valve is tricuspid. There is mild calcification of the aortic valve. There is mild thickening of the aortic valve. Aortic valve regurgitation is not visualized. Aortic valve sclerosis/calcification is present, without any evidence of aortic stenosis.  6. The inferior vena cava is dilated in size with >50% respiratory variability, suggesting right atrial pressure of 8 mmHg. Comparison(s): No significant change from prior study. Prior images reviewed side by side. 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 FINDINGS  Left Ventricle: There is systolic left ventricular mid-cavity obliteration with apical entrapment. There is no LV outflow obstruction. Left ventricular ejection fraction, by estimation, is >75%. The left ventricle has hyperdynamic function. The left ventricle has no regional wall motion abnormalities. The left ventricular internal cavity size was normal in size. There is severe asymmetric left ventricular hypertrophy of the apical segment. Left ventricular diastolic parameters are consistent with Grade II diastolic dysfunction (pseudonormalization). Elevated left atrial pressure. Right Ventricle: The right ventricular size is normal. No increase in right ventricular wall thickness. Right ventricular systolic function is normal. There is normal pulmonary artery systolic pressure. The tricuspid regurgitant velocity is 2.39 m/s, and  with an assumed right atrial pressure of 8 mmHg, the estimated right ventricular systolic pressure is 30.8 mmHg. Left Atrium: Left atrial size was severely dilated. Right Atrium: Right atrial size was normal in size.  Pericardium: There is no evidence of pericardial effusion. Mitral Valve: The mitral valve is normal in structure. Mild mitral valve regurgitation, with centrally-directed jet. No evidence of mitral valve stenosis. Tricuspid Valve: The tricuspid valve is normal in structure. Tricuspid valve regurgitation is mild. Aortic Valve: The aortic valve is tricuspid. There is mild calcification of the aortic valve. There is mild thickening of the aortic valve. Aortic valve regurgitation is not visualized. Aortic valve sclerosis/calcification is present, without any evidence of aortic stenosis. Pulmonic Valve: The pulmonic valve was grossly normal. Pulmonic valve regurgitation is not visualized. No evidence of pulmonic stenosis. Aorta: The aortic root and ascending aorta are structurally normal, with no evidence of dilitation. Venous: IVC assessment for right atrial pressure unable to be performed due to mechanical ventilation. The inferior vena cava is dilated in size with greater than 50% respiratory variability, suggesting right atrial pressure of 8 mmHg. IAS/Shunts: No atrial level shunt detected by color flow Doppler. Additional Comments: A device lead is visualized in the right ventricle.  LEFT VENTRICLE PLAX 2D LVIDd:         4.10 cm     Diastology LVIDs:         2.60 cm     LV e' medial:    5.55 cm/s LV PW:         1.10 cm     LV E/e' medial:  15.4 LV IVS:        1.10 cm     LV e' lateral:   6.96 cm/s                            LV E/e' lateral: 12.3  LV Volumes (MOD) LV vol d, MOD A2C: 76.7 ml LV vol d, MOD A4C: 87.0 ml LV vol s, MOD A2C: 12.2 ml LV  vol s, MOD A4C: 22.9 ml LV SV MOD A2C:     64.5 ml LV SV MOD A4C:     87.0 ml LV SV MOD BP:      67.6 ml RIGHT VENTRICLE RV Basal diam:  3.60 cm RV S prime:     10.80 cm/s TAPSE (M-mode): 1.8 cm LEFT ATRIUM            Index        RIGHT ATRIUM           Index LA diam:      4.80 cm  2.90 cm/m   RA Area:     16.10 cm LA Vol (A2C): 131.0 ml 79.10 ml/m  RA Volume:   40.20 ml   24.27 ml/m LA Vol (A4C): 71.6 ml  43.23 ml/m  AORTIC VALVE LVOT Vmax:   134.00 cm/s LVOT Vmean:  87.700 cm/s LVOT VTI:    0.236 m  AORTA Ao Root diam: 2.50 cm Ao Asc diam:  2.50 cm MITRAL VALVE               TRICUSPID VALVE MV Area (PHT): 3.49 cm    TV Peak grad:   22.8 mmHg MV E velocity: 85.70 cm/s  TV Vmax:        2.39 m/s MV A velocity: 62.10 cm/s  TR Peak grad:   22.8 mmHg MV E/A ratio:  1.38        TR Vmax:        239.00 cm/s                             SHUNTS                            Systemic VTI: 0.24 m Jerel Croitoru MD Electronically signed by Jerel Balding MD Signature Date/Time: 05/18/2024/5:26:40 PM    Final    DG Chest Portable 1 View Result Date: 05/18/2024 CLINICAL DATA:  Dyspnea on exertion. EXAM: PORTABLE CHEST - 1 VIEW COMPARISON:  09/26/2023 FINDINGS: Cardiomediastinal silhouette and pulmonary vasculature are within normal limits. Lungs are clear. Dual lead LEFT chest wall pacemaker is unchanged in configuration. RIGHT reversed total shoulder prosthesis is partially visualized. IMPRESSION: No acute cardiopulmonary process. Electronically Signed   By: Aliene Lloyd M.D.   On: 05/18/2024 12:43        Scheduled Meds:  apixaban   2.5 mg Oral BID   empagliflozin   10 mg Oral Daily   isosorbide  mononitrate  30 mg Oral Daily   megestrol   40 mg Oral Daily   metoprolol  succinate  25 mg Oral Daily   mirtazapine   15 mg Oral QHS   pantoprazole   40 mg Oral Daily   Continuous Infusions:  potassium PHOSPHATE  IVPB (in mmol)       LOS: 0 days    Time spent: 40 minutes    Toribio Hummer, MD [facility]   To contact the attending provider between 7A-7P or the covering provider during after hours 7P-7A, please log into the web site [email or web address] and access using universal [place] password for that web site. If you do not have the password, please call the hospital operator.  05/19/2024, 8:46 PM

## 2024-05-19 NOTE — Progress Notes (Signed)
 Heart Failure Navigator Progress Note  Assessed for Heart & Vascular TOC clinic readiness.  Patient does not meet criteria due to EF 75%, has a scheduled [facility] appointment on 08/02/2024. No HF TOC. .   Navigator will sign off at this time.   Stephane Haddock, BSN, Scientist, clinical (histocompatibility and immunogenetics) Only

## 2024-05-19 NOTE — Evaluation (Addendum)
 Clinical/Bedside Swallow Evaluation Patient Details  Name: Barbara [name] MRN: [id number] Date of Birth: [birth year]  Today's Date: 05/19/2024 Time: SLP Start Time (ACUTE ONLY): 0900 SLP Stop Time (ACUTE ONLY): 0943 SLP Time Calculation (min) (ACUTE ONLY): 43 min  Past Medical History:  Past Medical History:  Diagnosis Date   A-fib (HCC)    Allergy    CAD (coronary artery disease)    sees Dr. Debby Sor  cardiac stents - 2000   CHF (congestive heart failure) (HCC)    Colon polyps    Complication of anesthesia    rash/hives with caines   Dyspnea    02/12/18  when my heart gets out of rhythm, it has not been out of rhythym- since I have been on Tikosyn  (11/2017)   Dysrhythmia    afib fib   GERD (gastroesophageal reflux disease)    takes OTC- Omeprazole - prn   Heart murmur    History of kidney stones    History of stress test    show normal perfusion without scar or ischemia, post EF 68%   Hx of echocardiogram    show an EF 55%-60% range with grade 1 diastolic dysfunction, she had mitral anular calcification with mild MR, moderate LA dilation and mild pulmonary hypertension with a PA estimated pressure of 39mm   Hyperlipidemia    Hypertension    NSTEMI (non-ST elevated myocardial infarction) (HCC)    Osteoarthritis    Pacemaker    Pneumonia    hx of 2015    PONV (postoperative nausea and vomiting)    Sleep apnea    Past Surgical History:  Past Surgical History:  Procedure Laterality Date   CARDIAC CATHETERIZATION     11/2017   cardiac stents  2000   COLONOSCOPY  01-05-14   per Dr. Norleen Hint, clear, no repeats needed    COLONOSCOPY     CORONARY STENT PLACEMENT  2000   in LAD   CYSTOSCOPY WITH RETROGRADE PYELOGRAM, URETEROSCOPY AND STENT PLACEMENT Left 06/11/2022   Procedure: CYSTOSCOPY WITH RETROGRADE PYELOGRAM, LEFT STENT PLACEMENT;  Surgeon: Carolee Sherwood JONETTA DOUGLAS, MD;  Location: [facility];  Service: Urology;  Laterality: Left;   CYSTOSCOPY/URETEROSCOPY/HOLMIUM  LASER/STENT PLACEMENT Left 06/25/2022   Procedure: CYSTOSCOPY LEFT URETEROSCOPY/HOLMIUM LASER/STENT PLACEMENT;  Surgeon: Watt Norleen, MD;  Location: [facility];  Service: Urology;  Laterality: Left;  1 HR FOR THIS CASE   DIRECT LARYNGOSCOPY WITH RADIAESSE INJECTION N/A 02/13/2018   Procedure: DIRECT LARYNGOSCOPY WITH RADIAESSE INJECTION;  Surgeon: Carlie Clark, MD;  Location: [facility] OR;  Service: ENT;  Laterality: N/A;   ESOPHAGOGASTRODUODENOSCOPY N/A 03/11/2024   Procedure: EGD (ESOPHAGOGASTRODUODENOSCOPY);  Surgeon: Rosalie Kitchens, MD;  Location: [facility]COPY;  Service: Gastroenterology;  Laterality: N/A;   EYE SURGERY Left    CATARACT REMOVAL   HIP ARTHROPLASTY Left 09/27/2023   Procedure: ARTHROPLASTY BIPOLAR HIP (HEMIARTHROPLASTY);  Surgeon: Beverley Evalene JONETTA, MD;  Location: [facility] OR;  Service: Orthopedics;  Laterality: Left;   KNEE ARTHROSCOPY Left 01/06/2015   Procedure: LEFT KNEE ARTHROSCOPY, abrasion chondroplasty of the medial femerol condryl,medial and lateral menisectomy, microfracture , synovectomy of the suprpatellar pouch;  Surgeon: Tanda Heading, MD;  Location: [facility];  Service: Orthopedics;  Laterality: Left;   LEFT HEART CATH AND CORONARY ANGIOGRAPHY N/A 02/26/2019   Procedure: LEFT HEART CATH AND CORONARY ANGIOGRAPHY;  Surgeon: Sor Debby LABOR, MD;  Location: [facility];  Service: Cardiovascular;  Laterality: N/A;   LEFT HEART CATH AND CORONARY ANGIOGRAPHY N/A 05/06/2022   Procedure: LEFT HEART  CATH AND CORONARY ANGIOGRAPHY;  Surgeon: Mady Bruckner, MD;  Location: [facility];  Service: Cardiovascular;  Laterality: N/A;   MICROLARYNGOSCOPY W/VOCAL CORD INJECTION N/A 08/07/2018   Procedure: MICROLARYNGOSCOPY WITH VOCAL CORD INJECTION OF PROLARYN;  Surgeon: Carlie Clark, MD;  Location: [facility] OR;  Service: ENT;  Laterality: N/A;  JET VENTILATION   PACEMAKER IMPLANT N/A 05/08/2022   Procedure: PACEMAKER IMPLANT;  Surgeon: Inocencio Soyla Lunger, MD;  Location: [facility];  Service:  Cardiovascular;  Laterality: N/A;   REVERSE SHOULDER ARTHROPLASTY Right 03/23/2020   Procedure: REVERSE SHOULDER ARTHROPLASTY;  Surgeon: Sharl Selinda Dover, MD;  Location: [facility] OR;  Service: Orthopedics;  Laterality: Right;   VAGINAL HYSTERECTOMY  1971   HPI:  83 yo female adm to [facility] for weakness and FTT.  Pt recently dc from hospital 7/5 after admit with bilateral LE edema, weakness, FTT, Per note, son was worried that pt may be taking too many medications?  Pt with h/o chronic pain, recently weaned off Ativan , CT brain negative.  PET scan negative - conducted due to concern for multiple myeloma.  Pt also reported hoarseness to cardiologist - saying her left vocal cord is affected every surgery or intubation. She was scheduled for surgery one month ago but did not have it due to BP issues.  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  From chart, ? if pt was to have vocal cord injection by Dr Carlie in the past - but it was deferred due to BP.  Pt has had prior vocal fold injections by Dr Carlie- ? last being March 2019. CXR negative upon admit. Pt underwent endoscopy in 03/2024 that showed Normal larynx.- Very tiny hiatal hernia. - Normal stomach.   Assessment / Plan / Recommendation  Clinical Impression  Patient presents with functional oropharyngeal swallow ability based on clinical swallow evaluation.  She also has h/o GERD - and was noted to have eructation - but denies issues with actively refluxing at that time.  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 last 08/2018.  Observed her consuming her breakfast including water , tea, Ensure, 50% cereal with milk, 100% eggs and 100% sausage.  Swallow overall clinically judged to be timely with no evidence of retention  nor severe dysphagia.  Subtle cough noted x2 of approx 17 swallows and one cough noted with pt repositioning toward end of snack - Pt does endorse occasional issues with swallowing - stating sensation of food sticking in pharynx - and esophagus - (pointing distally) - stating she drinks liquids to clear.    Recommend advance diet to regular/thin to allow her open food options.    ? if pt may have some component of presbyesophagus - given her age and chronicity of GERD.  Nutrtiion and hydration is paramount at this time - thus rec dietician consult to help maximize po.  Pt reports she to supposed to consume 3 Boost daily.  Pt endorses better intake with Megace  use - and admits to gustatory changes since her SNF admit after hip fx 10/2023 that has not fully been rectified.  White coating on tongue which pt reports clears with lingual care/tongue scraping but states it recurrs. ? if this could be consistent with oral candidiasis?  Will follow  up x1 given depth of information shared with the pt- pt agreeable to plan.   SLP Visit Diagnosis: Dysphagia, unspecified (R13.10)    Aspiration Risk  Mild aspiration risk    Diet Recommendation Regular;Thin liquid    Liquid Administration via: Cup;Straw Medication Administration: Other (Comment) (as tolerated) Supervision: Patient able to self feed Compensations: Slow rate;Small sips/bites Postural Changes: Seated upright at 90 degrees;Remain upright for at least 30 minutes after po intake    Other  Recommendations Oral Care Recommendations: Oral care BID     Assistance Recommended at Discharge    Functional Status Assessment Patient has had a recent decline in their functional status and demonstrates the ability to make significant improvements in function in a reasonable and predictable amount of time.  Frequency and Duration min 1 x/week  1 week       Prognosis Prognosis for improved oropharyngeal function: Good      Swallow Study   General Date  of Onset: 05/19/24 HPI: 83 yo female adm to [facility] for weakness and FTT.  Pt recently dc from hospital 7/5 after admit with bilateral LE edema, weakness, FTT, Per note, son was worried that pt may be taking too many medications?  Pt with h/o chronic pain, recently weaned off Ativan , CT brain negative.  PET scan negative - conducted due to concern for multiple myeloma.  Pt also reported hoarseness to cardiologist - saying her left vocal cord is affected every surgery or intubation. She was scheduled for surgery one month ago but did not have it due to BP issues.  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  From chart, ? if pt was to have vocal cord injection by Dr Carlie in the past - but it was deferred due to BP.  Pt has had prior vocal fold injections by Dr Carlie- ? last being March 2019. CXR negative upon admit. Type of Study: Bedside Swallow Evaluation Previous Swallow Assessment: see prior MBS 01/2018 - functional oropharyngeal swallow Diet Prior to this Study: Dysphagia 3 (mechanical soft);Thin liquids (Level 0) Respiratory Status: Room air History of Recent Intubation: No Behavior/Cognition: Alert;Cooperative;Pleasant mood Oral Cavity Assessment: Within Functional Limits Oral Care Completed by SLP: No Oral Cavity - Dentition: Adequate natural dentition Vision: Functional for self-feeding Self-Feeding Abilities: Able to feed self Patient Positioning: Upright in bed Baseline Vocal Quality: Hoarse;Suspected CN X (Vagus) involvement;Other (comment) (h/o known vocal fold paresis *LEFT*) Volitional Cough: Strong (not productive) Volitional Swallow: Able to elicit    Oral/Motor/Sensory Function Overall Oral Motor/Sensory Function: Within functional limits   Ice Chips Ice chips: Not tested    Thin Liquid Thin Liquid: Within functional limits Presentation: Straw    Nectar Thick Nectar Thick Liquid: Within functional limits Presentation: Straw   Honey Thick Honey Thick Liquid: Not tested   Puree Puree: Within functional limits Presentation: Self Fed;Spoon   Solid     Solid: Within functional limits Presentation: Self Fed;Spoon      [name] 05/19/2024,10:05 AM  [name], MS [facility] SLP Acute Rehab Services Office [phone]     ? Oral candidiasis? - only lingual - not on buccal region

## 2024-05-19 NOTE — Progress Notes (Signed)
 Progress Note  Patient Name: Barbara [name] Date of Encounter: 05/19/2024 [facility] Cardiologist: Annabella Scarce, MD   Interval Summary   At the time of my interview, patient asleep but arouses easily to voice. Denies shortness of breath. Has mild left ankle swelling. No chest pain   Vital Signs Vitals:   05/18/24 2218 05/19/24 0119 05/19/24 0608 05/19/24 0831  BP: (!) 133/55 (!) 131/59 (!) 141/53   Pulse: 66 65 61   Resp: 15 17 17    Temp:  97.7 F (36.5 C) 98.8 F (37.1 C)   TempSrc:  Oral Oral   SpO2: 98% 99% 97%   Weight:    54.4 kg  Height:    5' 7 (1.702 m)    Intake/Output Summary (Last 24 hours) at 05/19/2024 0843 Last data filed at 05/19/2024 9167 Gross per 24 hour  Intake 770 ml  Output 2150 ml  Net -1380 ml      05/19/2024    8:31 AM 05/18/2024    2:51 PM 05/18/2024   10:45 AM  Last 3 Weights  Weight (lbs) 119 lb 14.9 oz 121 lb 0.5 oz 125 lb  Weight (kg) 54.4 kg 54.9 kg 56.7 kg      Telemetry/ECG  A paced rhythm - Personally Reviewed  Physical Exam  GEN: No acute distress. Laying in the bed with head elevated  Neck: No JVD Cardiac:  RRR, no murmurs, rubs, or gallops Respiratory: Clear to auscultation bilaterally. 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 Grade II DD. Normal RV function and normal PA systolic pressure. Mild MR. Overall, no significant changes when compared to prior study. Consistent with HCM, apical variant  - BNP elevated to 852. CXR showed no acute cardiopulmonary process. Echocardiogram did not show significant fluid overload  - She has been on IV lasix   20 mg the past 2 days- output 1.85 L urine yesterday. Creatinine increased from  1.61>1.82  - Euvolemic on exam  - Stop IV lasix  (already received AM dose). Repeat BMP in AM  - Continue jardiance  10 mg daily, metoprolol  succinate 25 mg daily    Swelling of L ankle - Yesterday it was noted that the left ankle swelling more than right.  - Echocardiogram suggested heart failure not primary cause. Possible venous insufficiency.  She broke her left hip in 09/2023  - DVT ultrasounds pending today - preliminary read showed no evidence of DVT in left lower extremity    Elevated troponin  History of CAD  - Previously had PCI to LAD with BMS in 200. Most recent cath in 05/2022 showed patent LAD stent with 30% in-stent restenosis and mild nonobstructive CAD  - hsTn 238>211. Flat, not consistent with ACS. Patient denies exertional chest pain  - Echo this admission with EF >75% - Suspect demand ischemia. No plans for ischemic evaluation at this time - Continue imdur  30 mg daily. Not on aspirin  given eliquis  use. Statin recently stopped by PCP due to muscle aches.    Hypertension - SBP randing from the 110s-130s (one reading at 141). Diastolic in the 50s-60s  - Continue imdur  30 mg daily, metoprolol  succinate 25 mg daily    PAF  SSS s/p PPM  - Telemetry shows  periods of atrial pacing and NSR  - continue eliqus 2.5 mg BID  - continue metoprolol  succinate 25 mg daily   Otherwise per primary  - Failure to thrive, loss of appetite, abnormal weight loss, moderate protein malnutrtion  - Hypercalcemia, hypophosphatemia  - Pancytopenia  - CKD stage III   For questions or updates, please contact [facility] Please consult [email or web address] for contact info under       Signed, Rollo FABIENE Louder, PA-C

## 2024-05-19 NOTE — TOC Initial Note (Signed)
 Transition of Care [facility]) - Initial/Assessment Note    Patient Details  Name: Barbara [name] MRN: [id number] Date of Birth: 10/17/1941  Transition of Care [facility] CM/SW Contact:    Tawni CHRISTELLA Eva, LCSW Phone Number: 05/19/2024, 3:33 PM  Clinical Narrative:                  CSW met with pt at bedside, pt is from home and is recommended for SNF placement. Pt is agreeable and would like [facility] or [facility]. CSW explained the process , noting pt will need insurance authorization. CSW to fax pt out for SNF placement. Care Management to follow.   Expected Discharge Plan: Skilled Nursing Facility Barriers to Discharge: Continued Medical Work up   Patient Goals and CMS Choice Patient states their goals for this hospitalization and ongoing recovery are:: SNf ot get stonger CMS Medicare.gov Compare Post Acute Care list provided to:: Patient Choice offered to / list presented to : Patient      Expected Discharge Plan and Services       Living arrangements for the past 2 months: Single Family Home                                      Prior Living Arrangements/Services Living arrangements for the past 2 months: Single Family Home Lives with:: Self Patient language and need for interpreter reviewed:: Yes Do you feel safe going back to the place where you live?: Yes      Need for Family Participation in Patient Care: No (Comment) Care giver support system in place?: No (comment)   Criminal Activity/Legal Involvement Pertinent to Current Situation/Hospitalization: No - Comment as needed  Activities of Daily Living   ADL Screening (condition at time of admission) Independently performs ADLs?: No Does the patient have a NEW difficulty with bathing/dressing/toileting/self-feeding that is expected to last >3 days?: Yes (Initiates electronic notice to provider for possible OT consult) Does the patient have a NEW difficulty with getting in/out of bed,  walking, or climbing stairs that is expected to last >3 days?: Yes (Initiates electronic notice to provider for possible PT consult) Does the patient have a NEW difficulty with communication that is expected to last >3 days?: No Is the patient deaf or have difficulty hearing?: No Does the patient have difficulty seeing, even when wearing glasses/contacts?: No Does the patient have difficulty concentrating, remembering, or making decisions?: No  Permission Sought/Granted                  Emotional Assessment Appearance:: Appears stated age Attitude/Demeanor/Rapport: Gracious Affect (typically observed): Accepting Orientation: : Oriented to Self, Oriented to Place, Oriented to  Time, Oriented to Situation   Psych Involvement: No (comment)  Admission diagnosis:  Hypercalcemia [E83.52] Hypokalemia [E87.6] DOE (dyspnea on exertion) [R06.09] Failure to thrive in adult [R62.7] Patient Active Problem List   Diagnosis Date Noted   Elevated troponin 05/18/2024   Acute on chronic diastolic congestive heart failure (HCC) 05/18/2024   Hypophosphatemia 05/18/2024   Hypokalemia 05/18/2024   Moderate protein malnutrition (HCC) 05/18/2024   Pancytopenia (HCC) 05/18/2024   Failure to thrive in adult 05/17/2024   Loss of appetite 05/04/2024   Hypercalcemia 04/06/2024   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   Normocytic anemia 03/09/2024   GI bleed 03/09/2024   ABLA (acute blood loss anemia) 03/09/2024   Abnormal weight loss 03/08/2024   Ankle edema,  bilateral 11/19/2023   Chronic diastolic CHF (congestive heart failure) (HCC) 10/22/2023   Closed displaced fracture of left femoral neck (HCC) 10/22/2023   Closed left subtrochanteric femur fracture (HCC) 09/26/2023   Bronchitis 08/04/2022   Ureteral stone 06/26/2022   Renal stones 06/26/2022   S/P cystoscopy 06/25/2022   Nephrolithiasis, 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, 5mm in size, leading to moderate left hydroureteronephrosis and significant left  perinephric stranding /edema 06/09/2022   OSA (obstructive sleep apnea) 06/09/2022   Acquired trigger finger of right index finger 05/10/2021   Trigger thumb of left hand 05/10/2021   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   Persistent atrial fibrillation (HCC) 10/07/2020   CKD (chronic kidney disease), stage III (HCC) 05/20/2020   Apical variant hypertrophic cardiomyopathy (HCC)    Chest pain of uncertain etiology    Chest pain    S/P reverse total shoulder arthroplasty, 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   Encounter for orthopedic follow-up care 02/18/2020   PAF (paroxysmal atrial fibrillation) (HCC) 02/28/2019   Demand myocardial infarction (HCC) 02/25/2019   Pain in left knee 01/06/2018   Dysphonia 12/29/2017   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   Gastroesophageal reflux disease 11/06/2017   Bradycardia 12/27/2015   Exertional dyspnea 12/23/2014   Sinus bradycardia 09/13/2014   LUMBAGO 05/03/2010   VERTIGO 01/21/2009   TEMPOROMANDIBULAR JOINT PAIN 08/31/2008   Osteoarthritis 05/31/2008   TROCHANTERIC BURSITIS 05/31/2008   Essential hypertension 04/06/2007   CAD (coronary artery disease/elevated troponin 04/06/2007   ALLERGIC RHINITIS 04/06/2007   History of colonic polyps 04/06/2007   Hyperlipidemia 04/06/2007   PCP:  Johnny Garnette LABOR, MD Pharmacy:   [facility] [address] [place], [place] - [address] [place] [address] Phone: [phone] Fax: [phone]     Social Drivers of Health (SDOH) Social History: SDOH Screenings   Food Insecurity: No Food Insecurity (05/18/2024)  Housing: Low Risk  (05/18/2024)  Transportation Needs: Unmet Transportation Needs (05/18/2024)  Utilities: Not At Risk (05/18/2024)  Alcohol Screen: Low Risk  (08/05/2022)  Depression (PHQ2-9): Low Risk  (04/05/2024)  Financial Resource Strain: Low Risk  (08/05/2022)  Physical Activity: Insufficiently Active (07/02/2022)   Social Connections: Moderately Integrated (05/18/2024)  Stress: No Stress Concern Present (07/02/2022)  Tobacco Use: Medium Risk (05/17/2024)   SDOH Interventions:     Readmission Risk Interventions    03/11/2024    1:07 PM  Readmission Risk Prevention Plan  Transportation Screening Complete  PCP or Specialist Appt within 5-7 Days Complete  Home Care Screening Complete  Medication Review (RN CM) Complete

## 2024-05-19 NOTE — Evaluation (Signed)
 Occupational Therapy Evaluation Patient Details Name: Barbara [name] MRN: [id number] DOB: [birth year] Today's Date: 05/19/2024   History of Present Illness   83 yr old female  who was sent to the emergency department by recommendation of her primary care provider due to exertional dyspnea, lower extremity edema, failure to thrive with poor appetite and muscle pains. Found to have CHF, malnutrition, and failure to thrive. Pt with medical history significant of bilateral saphenous ablation, CAD, NSTEMI, chronic diastolic CHF heart murmur, pacemaker placement, dyspnea, hypertension, hyperlipidemia, colon polyps, GERD, nephrolithiasis, osteoarthritis, history of pneumonia, sleep apnea, hypercalcemia     Clinical Impressions The pt is currently presenting below her baseline level of functioning for self-care management, as she is limited by the below listed deficits (see OT problem list). She reported having significant difficulty with performing ADLs, household chores, and ambulation over the past ~2 months. During the session, she required mod assist for bed mobility and max assist for lower body dressing. She further noted to be with general deconditioning, lethargy, and generalized weakness. She will benefit from further OT services to maximize her ADL performance & to decrease the risk for further weakness and deconditioning. Patient will benefit from continued inpatient follow up therapy, <3 hours/day.      If plan is discharge home, recommend the following:   A lot of help with walking and/or transfers;A lot of help with bathing/dressing/bathroom;Assistance with cooking/housework     Functional Status Assessment   Patient has had a recent decline in their functional status and demonstrates the ability to make significant improvements in function in a reasonable and predictable amount of time.     Equipment Recommendations   Other (comment) (to be determined pending progress at  next setting)     Recommendations for Other Services         Precautions/Restrictions   Precautions Precautions: Fall Restrictions Weight Bearing Restrictions Per Provider Order: No     Mobility Bed Mobility Overal bed mobility: Needs Assistance Bed Mobility: Rolling Rolling: Mod assist, Used rails                      ADL either performed or assessed with clinical judgement   ADL Overall ADL's : Needs assistance/impaired Eating/Feeding: Set up;Bed level   Grooming: Minimal assistance;Bed level           Upper Body Dressing : Minimal assistance;Bed level   Lower Body Dressing: Maximal assistance;Bed level                       Vision Baseline Vision/History: 1 Wears glasses Additional Comments: She correctly read the time depicted on the wall clock.            Pertinent Vitals/Pain Pain Assessment Pain Assessment: No/denies pain     Extremity/Trunk Assessment Upper Extremity Assessment Upper Extremity Assessment: Generalized weakness;Right hand dominant;RUE deficits/detail;LUE deficits/detail RUE Deficits / Details: Reported chronic shoulder AROM limitations. Elbow and hand AROM WLF. Gross BUE strength 4-/5. She further indicated difficulty holding onto items in her hands, which started the past fews weeks. Partial numbness of hands reported. LUE Deficits / Details: Reported chronic shoulder AROM limitations. Elbow and hand AROM WLF. Gross BUE strength 4-/5. She further indicated difficulty holding onto items in her hands, which started the past fews weeks. Partial numbness of hands reported.   Lower Extremity Assessment Lower Extremity Assessment: Generalized weakness RLE Deficits / Details:  (AROM WFL. Partial numbness of feet reported) RLE Sensation: WNL  LLE Deficits / Details:  (AROM WFL. Partial numbness of feet reported) LLE Sensation: WNL      Communication Communication Communication: No apparent difficulties   Cognition  Arousal: Lethargic Behavior During Therapy: WFL for tasks assessed/performed               OT - Cognition Comments: Oriented to person, place, and month. Disoriented to year, as she reported the year to be 2023. Partly oriented to situation. Followed 1 step commands consistently.                                    Home Living Family/patient expects to be discharged to:: Private residence Living Arrangements: Alone Available Help at Discharge: Family;Available PRN/intermittently Type of Home: House Home Access: Stairs to enter [employer] of Steps: 2 Entrance Stairs-Rails: Right Home Layout: One level     Bathroom Shower/Tub: Producer, television/film/video: Handicapped height Bathroom Accessibility: Yes   Home Equipment: Rollator (4 wheels);Rolling Walker (2 wheels);Cane - quad;Shower seat - built in Art gallery manager;Other (Comment) Additional Comments: church friends can assist some      Prior Functioning/Environment Prior Level of Function : Independent/Modified Independent             Mobility Comments:  (She has had difficulty with ambulation and ADLs over the past ~2 months due to weakness. Prior to 2 months ago, she used a RW inside her home and quad cane outside the home.) ADLs Comments:  (She had recent difficulty managing ADLs and household chores. Prior to ~2 months ago, she was modified independent to independent with ADLs, she drove, & performed cooking & cleaning.  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.)    OT Problem List: Decreased strength;Decreased range of motion;Impaired balance (sitting and/or standing);Decreased activity tolerance;Decreased coordination;Decreased knowledge of use of DME or AE;Impaired sensation   OT Treatment/Interventions: Self-care/ADL training;Therapeutic exercise;Energy conservation;Therapeutic activities;DME and/or AE instruction;Balance training;Patient/family education      OT  Goals(Current goals can be found in the care plan section)   Acute Rehab OT Goals Patient Stated Goal: to be able to walk OT Goal Formulation: With patient Time For Goal Achievement: 06/02/24 Potential to Achieve Goals: Good ADL Goals Pt Will Perform Upper Body Dressing: with set-up;sitting Pt Will Perform Lower Body Dressing: with supervision;sit to/from stand;sitting/lateral leans;with adaptive equipment Pt Will Transfer to Toilet: with supervision;ambulating Pt Will Perform Toileting - Clothing Manipulation and hygiene: with supervision;sit to/from stand   OT Frequency:  Min 2X/week       AM-PAC OT 6 Clicks Daily Activity     Outcome Measure Help from another person eating meals?: A Little Help from another person taking care of personal grooming?: A Little Help from another person toileting, which includes using toliet, bedpan, or urinal?: A Lot Help from another person bathing (including washing, rinsing, drying)?: A Lot Help from another person to put on and taking off regular upper body clothing?: A Little Help from another person to put on and taking off regular lower body clothing?: A Lot 6 Click Score: 15   End of Session Equipment Utilized During Treatment: Other (comment) (N/A) Nurse Communication: Mobility status  Activity Tolerance: Patient limited by lethargy Patient left: in bed;with call bell/phone within reach;with bed alarm set  OT Visit Diagnosis: Muscle weakness (generalized) (M62.81);Adult, failure to thrive (R62.7);Unsteadiness on feet (R26.81)  Time: 8941-8875 OT Time Calculation (min): 26 min Charges:  OT General Charges $OT Visit: 1 Visit OT Evaluation $OT Eval Moderate Complexity: 1 Mod OT Treatments $Therapeutic Activity: 8-22 mins    Delanna LITTIE Molt, OTR/L 05/19/2024, 2:52 PM

## 2024-05-19 NOTE — Care Management Obs Status (Signed)
 MEDICARE OBSERVATION STATUS NOTIFICATION   Patient Details  Name: ANYIA GIERKE MRN: [id number] Date of Birth: Apr 05, 1941   Medicare Observation Status Notification Given:  Yes    Tawni CHRISTELLA Eva, LCSW 05/19/2024, 3:26 PM

## 2024-05-19 NOTE — Progress Notes (Signed)
 LLE venous exam is completed. Hazel Leveille, RVT

## 2024-05-20 ENCOUNTER — Other Ambulatory Visit (HOSPITAL_COMMUNITY): Payer: Self-pay

## 2024-05-20 ENCOUNTER — Encounter (HOSPITAL_COMMUNITY): Payer: Self-pay | Admitting: *Deleted

## 2024-05-20 DIAGNOSIS — R627 Adult failure to thrive: Secondary | ICD-10-CM | POA: Diagnosis not present

## 2024-05-20 DIAGNOSIS — E7800 Pure hypercholesterolemia, unspecified: Secondary | ICD-10-CM | POA: Diagnosis not present

## 2024-05-20 DIAGNOSIS — K219 Gastro-esophageal reflux disease without esophagitis: Secondary | ICD-10-CM | POA: Diagnosis not present

## 2024-05-20 DIAGNOSIS — I480 Paroxysmal atrial fibrillation: Secondary | ICD-10-CM | POA: Diagnosis not present

## 2024-05-20 DIAGNOSIS — I10 Essential (primary) hypertension: Secondary | ICD-10-CM | POA: Diagnosis not present

## 2024-05-20 DIAGNOSIS — N1832 Chronic kidney disease, stage 3b: Secondary | ICD-10-CM | POA: Diagnosis not present

## 2024-05-20 DIAGNOSIS — G4733 Obstructive sleep apnea (adult) (pediatric): Secondary | ICD-10-CM | POA: Diagnosis not present

## 2024-05-20 DIAGNOSIS — D61818 Other pancytopenia: Secondary | ICD-10-CM | POA: Diagnosis not present

## 2024-05-20 DIAGNOSIS — R7989 Other specified abnormal findings of blood chemistry: Secondary | ICD-10-CM | POA: Diagnosis not present

## 2024-05-20 DIAGNOSIS — I2510 Atherosclerotic heart disease of native coronary artery without angina pectoris: Secondary | ICD-10-CM | POA: Diagnosis not present

## 2024-05-20 LAB — RENAL FUNCTION PANEL
Albumin: 2.5 g/dL — ABNORMAL LOW (ref 3.5–5.0)
Anion gap: 7 (ref 5–15)
BUN: 30 mg/dL — ABNORMAL HIGH (ref 8–23)
CO2: 28 mmol/L (ref 22–32)
Calcium: 11 mg/dL — ABNORMAL HIGH (ref 8.9–10.3)
Chloride: 104 mmol/L (ref 98–111)
Creatinine, Ser: 1.47 mg/dL — ABNORMAL HIGH (ref 0.44–1.00)
GFR, Estimated: 35 mL/min — ABNORMAL LOW (ref >60)
Glucose, Bld: 98 mg/dL (ref 70–99)
Phosphorus: 4.6 mg/dL (ref 2.5–4.6)
Potassium: 3.7 mmol/L (ref 3.5–5.1)
Sodium: 139 mmol/L (ref 135–145)

## 2024-05-20 LAB — CBC
HCT: 33.2 % — ABNORMAL LOW (ref 36.0–46.0)
Hemoglobin: 10.4 g/dL — ABNORMAL LOW (ref 12.0–15.0)
MCH: 27.8 pg (ref 26.0–34.0)
MCHC: 31.3 g/dL (ref 30.0–36.0)
MCV: 88.8 fL (ref 80.0–100.0)
Platelets: 115 K/uL — ABNORMAL LOW (ref 150–400)
RBC: 3.74 MIL/uL — ABNORMAL LOW (ref 3.87–5.11)
RDW: 19.8 % — ABNORMAL HIGH (ref 11.5–15.5)
WBC: 3.3 K/uL — ABNORMAL LOW (ref 4.0–10.5)
nRBC: 0 % (ref 0.0–0.2)

## 2024-05-20 LAB — MAGNESIUM: Magnesium: 2.2 mg/dL (ref 1.7–2.4)

## 2024-05-20 MED ORDER — LORATADINE 10 MG PO TABS: 10.0000 mg | ORAL_TABLET | Freq: Every day | ORAL | Status: DC | PRN

## 2024-05-20 MED ORDER — ALUM & MAG HYDROXIDE-SIMETH 200-200-20 MG/5ML PO SUSP: 30.0000 mL | ORAL | Status: DC | PRN

## 2024-05-20 MED ORDER — NITROGLYCERIN 0.4 MG SL SUBL: 0.4000 mg | SUBLINGUAL_TABLET | SUBLINGUAL | Status: DC | PRN

## 2024-05-20 NOTE — Progress Notes (Signed)
 PROGRESS NOTE    Barbara [name]  FMW:[id number] DOB: 1941-10-01 DOA: 05/18/2024 PCP: Johnny Garnette LABOR, MD    Chief Complaint  Patient presents with   Leg Swelling    Brief Narrative:  Patient 83 year old female history of bilateral saphenous ablation, CAD, non-STEMI, chronic diastolic CHF, heart murmur, status post PPM, dyspnea, hypertension, hyperlipidemia, colonic polyps, GERD, nephrolithiasis, OA, hypercalcemia presented to the ED from PCPs office due to exertional dyspnea, lower extremity edema, failure to thrive with poor oral intake and muscle pain.  Chest x-ray done with no significant abnormalities noted.  Patient noted to have elevated troponin and admitted due to concerns for acute on chronic diastolic CHF and placed on IV Lasix .  Cardiology consulted and following.   Assessment & Plan:   Principal Problem:   Failure to thrive in adult Active Problems:   Abnormal weight loss   CAD (coronary artery disease/elevated troponin   PAF (paroxysmal atrial fibrillation) (HCC)   Essential hypertension   CKD (chronic kidney disease), stage III (HCC)   Hyperlipidemia   Gastroesophageal reflux disease   OSA (obstructive sleep apnea)   Hypercalcemia   Loss of appetite   Elevated troponin   Acute on chronic diastolic congestive heart failure (HCC)   Hypophosphatemia   Hypokalemia   Moderate protein malnutrition (HCC)   Pancytopenia (HCC)  #1 failure to thrive/moderate protein calorie malnutrition -??  Etiology. - May be secondary to persistent hypercalcemia. - Continue Megace , mirtazapine . - PT/OT. - SLP consulted and following.  2.  Acute on chronic diastolic CHF exacerbation/elevated troponin - Patient presented with dyspnea on exertion, some lower extremity edema. - 2D echo obtained with systolic left ventricular mid cavity obliteration with apical entrapment, no LV outflow obstruction, EF > 75%.  Severe asymmetric LVH of the apical segment.  Grade 2 diastolic  dysfunction.  Severely dilated left atrial size.  Mild MVR. - Patient noted to have been placed on Lasix  20 mg IV daily with a urine output of 1.9 L over the past 24 hours. -Patient seen in consultation by cardiology IV Lasix  have been discontinued and recommending Lasix  10 to 20 mg daily as needed for edema or shortness of breath. -Continue Jardiance , metoprolol , Imdur . - Per cardiology elevated troponin likely secondary to demand ischemia and no ischemic evaluation indicated at this time.  3.  Left ankle swelling -2D echo with systolic left ventricular mid cavity obliteration with apical entrapment, no LV outflow obstruction, EF > 75%.  Severe asymmetric LVH of the apical segment.  Grade 2 diastolic dysfunction.  Severely dilated left atrial size.  Mild MVR. -Lower extremity Dopplers done negative for DVT. -Compression stockings, keep lower extremities elevated.  4.  Hypertension  - Imdur , metoprolol .    5.  Paroxysmal A-fib/PPM -Currently atrial paced. - Continue metoprolol  for rate control, Eliquis  for anticoagulation.  6.  Hypercalcemia - Noted to be chronic in nature. - Patient with negative workup for gammopathy. - Dr. Celinda discussed with Dr.Pasam who noted to have seen patient last month and recommended consideration for repeat chest CT to look for lymphadenopathy or sarcoidosis - Continue treatment of constipation. - Patient noted to have received lactulose .  7.  Hypophosphatemia - Repleted.   8.  Hypokalemia - Repleted.   9.  Pancytopenia -Dr. Celinda, discussed with Dr.Pasam of hematology/oncology who felt may be due to age or early MDS. - May need to consider IR bone marrow biopsy.  10.  CKD stage IIIb -Patient with slight bump in creatinine, IV diuresis held. -  Creatinine trended back down.  11.  Hyperlipidemia -Statin on hold due to body aches. - Continue to hold until outpatient follow-up with PCP.  12.  GERD - Continue PPI.   13.  OSA -CPAP  nightly.    DVT prophylaxis: Eliquis  Code Status: Full Family Communication: Updated patient.  No family at bedside. Disposition: SNF.  Status is: Observation The patient remains OBS appropriate and will d/c before 2 midnights.   Consultants:  Cardiology: Dr. Raford 05/18/2024  Procedures:  2D echo 05/18/2024 Chest x-ray 05/18/2024 Lower extremity Doppler 05/19/2024  Antimicrobials:  Anti-infectives (From admission, onward)    None         Subjective: Patient sitting up in recliner eating lunch.  Sister-in-law at bedside.  Denies any chest pain.  Still with shortness of breath on exertion when going to the bathroom and coming back.  Denies any abdominal pain.  Patient with some complaints of difficulty swallowing, states whenever she eats sometimes tickles her throat and she starts to cough but it eventually goes down.    Objective: Vitals:   05/19/24 1237 05/19/24 2004 05/20/24 0402 05/20/24 0600  BP: (!) 120/48 (!) 128/56 (!) 144/65   Pulse: 64 63 (!) 59   Resp: 20 14 14    Temp: 97.8 F (36.6 C) 98.3 F (36.8 C) 98.6 F (37 C)   TempSrc: Oral Oral Oral   SpO2: 99% 96% 100%   Weight:    56.7 kg  Height:        Intake/Output Summary (Last 24 hours) at 05/20/2024 1243 Last data filed at 05/20/2024 0938 Gross per 24 hour  Intake 1340 ml  Output 1350 ml  Net -10 ml   Filed Weights   05/18/24 1451 05/19/24 0831 05/20/24 0600  Weight: 54.9 kg 54.4 kg 56.7 kg    Examination:  General exam: NAD Respiratory system: CTAB.  No wheezes, no crackles, no rhonchi.  Fair air movement.  Speaking in full sentences.  Cardiovascular system: RRR. 3/6 SEM.No JVD, murmurs, rubs, gallops or clicks. No pedal edema. Gastrointestinal system: Abdomen is soft, nontender, nondistended, positive bowel sounds.  No rebound.  No guarding.  Central nervous system: Alert and oriented. No focal neurological deficits. Extremities: Symmetric 5 x 5 power. Skin: No rashes, lesions or  ulcers Psychiatry: Judgement and insight appear normal. Mood & affect appropriate.     Data Reviewed: I have personally reviewed following labs and imaging studies  CBC: Recent Labs  Lab 05/18/24 1132 05/19/24 0416 05/20/24 0423  WBC 3.4* 2.8* 3.3*  HGB 11.1* 10.3* 10.4*  HCT 35.4* 32.9* 33.2*  MCV 89.6 90.6 88.8  PLT 119* 111* 115*    Basic Metabolic Panel: Recent Labs  Lab 05/18/24 1132 05/18/24 1427 05/19/24 0416 05/20/24 0423  NA 138  --  135 139  K 3.0*  --  3.5 3.7  CL 104  --  103 104  CO2 25  --  25 28  GLUCOSE 110*  --  107* 98  BUN 28*  --  29* 30*  CREATININE 1.61*  --  1.82* 1.47*  CALCIUM  11.5*  --  10.8* 11.0*  MG 1.8  --   --  2.2  PHOS  --  2.0* 2.1* 4.6    GFR: Estimated Creatinine Clearance: 25 mL/min (A) (by C-G formula based on SCr of 1.47 mg/dL (H)).  Liver Function Tests: Recent Labs  Lab 05/18/24 1132 05/19/24 0416 05/20/24 0423  AST 63* 58*  --   ALT 25 24  --  ALKPHOS 67 74  --   BILITOT 0.7 0.7  --   PROT 7.2 6.5  --   ALBUMIN 2.6* 2.5* 2.5*    CBG: Recent Labs  Lab 05/18/24 1114  GLUCAP 98     No results found for this or any previous visit (from the past 240 hours).       Radiology Studies: VAS US  LOWER EXTREMITY VENOUS (DVT) Result Date: 05/19/2024  Lower Venous DVT Study Patient Name:  Jahyra M [facility]  Date of Exam:   05/19/2024 Medical Rec #: [id number]               Accession #:    [id number] Date of Birth: [birth year]               Patient Gender: F Patient Age:   29 years Exam Location:  [facility] Procedure:      VAS US  LOWER EXTREMITY VENOUS (DVT) Referring Phys: TIFFANY [place] --------------------------------------------------------------------------------  Indications: Edema.  Limitations: Pt is unable to position leg that is adequate for optimal imaging. Comparison Study: No prior Performing Technologist: Elmarie Lindau, RVT  Examination Guidelines: A complete evaluation includes B-mode  imaging, spectral Doppler, color Doppler, and power Doppler as needed of all accessible portions of each vessel. Bilateral testing is considered an integral part of a complete examination. Limited examinations for reoccurring indications may be performed as noted. The reflux portion of the exam is performed with the patient in reverse Trendelenburg.  +-----+---------------+---------+-----------+----------+---------------+ RIGHTCompressibilityPhasicitySpontaneityPropertiesThrombus Aging  +-----+---------------+---------+-----------+----------+---------------+ CFV  Full           Yes      Yes                  thickened walls +-----+---------------+---------+-----------+----------+---------------+   +---------+---------------+---------+-----------+----------+--------------+ LEFT     CompressibilityPhasicitySpontaneityPropertiesThrombus Aging +---------+---------------+---------+-----------+----------+--------------+ CFV      Full           Yes      Yes                                 +---------+---------------+---------+-----------+----------+--------------+ SFJ      Full                                                        +---------+---------------+---------+-----------+----------+--------------+ FV Prox  Full                                                        +---------+---------------+---------+-----------+----------+--------------+ FV Mid   Full                                                        +---------+---------------+---------+-----------+----------+--------------+ FV DistalFull                                                        +---------+---------------+---------+-----------+----------+--------------+  PFV      Full                                                        +---------+---------------+---------+-----------+----------+--------------+ POP      Full           Yes      Yes                                  +---------+---------------+---------+-----------+----------+--------------+ PTV      Full                                                        +---------+---------------+---------+-----------+----------+--------------+ PERO     Full                                                        +---------+---------------+---------+-----------+----------+--------------+     Summary: RIGHT: - No evidence of common femoral vein obstruction.  - No cystic structure found in the popliteal fossa.  LEFT: - There is no evidence of deep vein thrombosis in the lower extremity.  - No cystic structure found in the popliteal fossa.  *See table(s) above for measurements and observations. Electronically signed by Gaile New MD on 05/19/2024 at 5:12:14 PM.    Final    ECHOCARDIOGRAM COMPLETE Result Date: 05/18/2024    ECHOCARDIOGRAM REPORT   Patient Name:   JAYMIE MCKIDDY [facility] Date of Exam: 05/18/2024 Medical Rec #:  [id number]              Height:       67.0 in Accession #:    [id number]             Weight:       125.0 lb Date of Birth:  Sep 14, 1941              BSA:          1.656 m Patient Age:    83 years               BP:           125/47 mmHg Patient Gender: F                      HR:           62 bpm. Exam Location:  Inpatient Procedure: 2D Echo, Cardiac Doppler, Color Doppler and Intracardiac            Opacification Agent (Both Spectral and Color Flow Doppler were            utilized during procedure). Indications:    Elevated Troponin  History:        Patient has prior history of Echocardiogram examinations. Risk                 Factors:Hypertension.  Sonographer:    Vella Key Referring Phys: [id number] DAVID MANUEL ORTIZ IMPRESSIONS  1. There is systolic left ventricular mid-cavity obliteration with apical entrapment. There is no LV outflow obstruction. Left ventricular ejection fraction, by estimation, is >75%. The left ventricle has hyperdynamic function. The left ventricle has no  regional wall motion  abnormalities. There is severe asymmetric left ventricular hypertrophy of the apical segment. Left ventricular diastolic parameters are consistent with Grade II diastolic dysfunction (pseudonormalization). Elevated left atrial pressure.  2. Right ventricular systolic function is normal. The right ventricular size is normal. There is normal pulmonary artery systolic pressure. The estimated right ventricular systolic pressure is 30.8 mmHg.  3. Left atrial size was severely dilated.  4. The mitral valve is normal in structure. Mild mitral valve regurgitation. No evidence of mitral stenosis.  5. The aortic valve is tricuspid. There is mild calcification of the aortic valve. There is mild thickening of the aortic valve. Aortic valve regurgitation is not visualized. Aortic valve sclerosis/calcification is present, without any evidence of aortic stenosis.  6. The inferior vena cava is dilated in size with >50% respiratory variability, suggesting right atrial pressure of 8 mmHg. Comparison(s): No significant change from prior study. Prior images reviewed side by side. 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 FINDINGS  Left Ventricle: There is systolic left ventricular mid-cavity obliteration with apical entrapment. There is no LV outflow obstruction. Left ventricular ejection fraction, by estimation, is >75%. The left ventricle has hyperdynamic function. The left ventricle has no regional wall motion abnormalities. The left ventricular internal cavity size was normal in size. There is severe asymmetric left ventricular hypertrophy of the apical segment. Left ventricular diastolic parameters are consistent with Grade II diastolic dysfunction (pseudonormalization). Elevated left atrial pressure. Right Ventricle: The right ventricular size is normal. No increase in right ventricular wall thickness. Right ventricular systolic function is normal. There is normal  pulmonary artery systolic pressure. The tricuspid regurgitant velocity is 2.39 m/s, and  with an assumed right atrial pressure of 8 mmHg, the estimated right ventricular systolic pressure is 30.8 mmHg. Left Atrium: Left atrial size was severely dilated. Right Atrium: Right atrial size was normal in size. Pericardium: There is no evidence of pericardial effusion. Mitral Valve: The mitral valve is normal in structure. Mild mitral valve regurgitation, with centrally-directed jet. No evidence of mitral valve stenosis. Tricuspid Valve: The tricuspid valve is normal in structure. Tricuspid valve regurgitation is mild. Aortic Valve: The aortic valve is tricuspid. There is mild calcification of the aortic valve. There is mild thickening of the aortic valve. Aortic valve regurgitation is not visualized. Aortic valve sclerosis/calcification is present, without any evidence of aortic stenosis. Pulmonic Valve: The pulmonic valve was grossly normal. Pulmonic valve regurgitation is not visualized. No evidence of pulmonic stenosis. Aorta: The aortic root and ascending aorta are structurally normal, with no evidence of dilitation. Venous: IVC assessment for right atrial pressure unable to be performed due to mechanical ventilation. The inferior vena cava is dilated in size with greater than 50% respiratory variability, suggesting right atrial pressure of 8 mmHg. IAS/Shunts: No atrial level shunt detected by color flow Doppler. Additional Comments: A device lead is visualized in the right ventricle.  LEFT VENTRICLE PLAX 2D LVIDd:         4.10 cm     Diastology LVIDs:         2.60 cm     LV e' medial:    5.55 cm/s LV PW:         1.10 cm     LV E/e' medial:  15.4 LV IVS:        1.10 cm     LV e' lateral:   6.96 cm/s                            LV E/e' lateral: 12.3  LV Volumes (MOD) LV vol d, MOD A2C: 76.7 ml LV vol d, MOD A4C: 87.0 ml LV vol s, MOD A2C: 12.2 ml LV vol s, MOD A4C: 22.9 ml LV SV MOD A2C:     64.5 ml LV SV MOD A4C:      87.0 ml LV SV MOD BP:      67.6 ml RIGHT VENTRICLE RV Basal diam:  3.60 cm RV S prime:     10.80 cm/s TAPSE (M-mode): 1.8 cm LEFT ATRIUM            Index        RIGHT ATRIUM           Index LA diam:      4.80 cm  2.90 cm/m   RA Area:     16.10 cm LA Vol (A2C): 131.0 ml 79.10 ml/m  RA Volume:   40.20 ml  24.27 ml/m LA Vol (A4C): 71.6 ml  43.23 ml/m  AORTIC VALVE LVOT Vmax:   134.00 cm/s LVOT Vmean:  87.700 cm/s LVOT VTI:    0.236 m  AORTA Ao Root diam: 2.50 cm Ao Asc diam:  2.50 cm MITRAL VALVE               TRICUSPID VALVE MV Area (PHT): 3.49 cm    TV Peak grad:   22.8 mmHg MV E velocity: 85.70 cm/s  TV Vmax:        2.39 m/s MV A velocity: 62.10 cm/s  TR Peak grad:   22.8 mmHg MV E/A ratio:  1.38        TR Vmax:        239.00 cm/s                             SHUNTS                            Systemic VTI: 0.24 m Mihai Croitoru MD Electronically signed by Jerel Balding MD Signature Date/Time: 05/18/2024/5:26:40 PM    Final         Scheduled Meds:  apixaban   2.5 mg Oral BID   empagliflozin   10 mg Oral Daily   isosorbide  mononitrate  30 mg Oral Daily   megestrol   40 mg Oral Daily   metoprolol  succinate  25 mg Oral Daily   mirtazapine   15 mg Oral QHS   pantoprazole   40 mg Oral Daily   polyethylene glycol  17 g Oral BID   senna-docusate  1 tablet Oral BID   Continuous Infusions:     LOS: 0 days    Time spent: 40 minutes    Toribio Hummer, MD [facility]   To contact the attending provider between 7A-7P or the covering provider during after hours 7P-7A, please log into the web site [email or web address] and access using universal [place] password for that web site. If you do not have the password, please call the hospital operator.  05/20/2024, 12:43 PM

## 2024-05-20 NOTE — TOC Progression Note (Signed)
 Transition of Care [facility]) - Progression Note    Patient Details  Name: Barbara [name] MRN: [id number] Date of Birth: [birth year]  [facility] CM/SW Contact  Sonda Manuella Quill, RN Phone Number: 05/20/2024, 3:48 PM  Clinical Narrative:    Faxed out for bed offers, per previous TOC note [facility] facility of choice; awaiting bed offers; ins auth.   Expected Discharge Plan: Skilled Nursing Facility Barriers to Discharge: Continued Medical Work up  Expected Discharge Plan and Services       Living arrangements for the past 2 months: Single Family Home                                       Social Determinants of Health (SDOH) Interventions SDOH Screenings   Food Insecurity: No Food Insecurity (05/18/2024)  Housing: Low Risk  (05/18/2024)  Transportation Needs: Unmet Transportation Needs (05/18/2024)  Utilities: Not At Risk (05/18/2024)  Alcohol Screen: Low Risk  (08/05/2022)  Depression (PHQ2-9): Low Risk  (04/05/2024)  Financial Resource Strain: Low Risk  (08/05/2022)  Physical Activity: Insufficiently Active (07/02/2022)  Social Connections: Moderately Integrated (05/18/2024)  Stress: No Stress Concern Present (07/02/2022)  Tobacco Use: Medium Risk (05/20/2024)    Readmission Risk Interventions    03/11/2024    1:07 PM  Readmission Risk Prevention Plan  Transportation Screening Complete  PCP or Specialist Appt within 5-7 Days Complete  Home Care Screening Complete  Medication Review (RN CM) Complete

## 2024-05-20 NOTE — [place] FL2 (Signed)
 [place]  MEDICAID FL2 LEVEL OF CARE FORM     IDENTIFICATION  Patient Name: Barbara [name] Birthdate: November 25, 1940 Sex: female Admission Date (Current Location): 05/18/2024  [facility] and [place] Number:  Producer, television/film/video and Address:  [facility],  [address], [place] [address]      Provider Number:    Attending Physician Name and Address:  Sebastian Toribio GAILS, MD  Relative Name and Phone Number:  [name] (sister) [phone]    Current Level of Care: Hospital Recommended Level of Care: Skilled Nursing Facility Prior Approval Number:    Date Approved/Denied:   PASRR Number: [id number]  Discharge Plan: SNF    Current Diagnoses: Patient Active Problem List   Diagnosis Date Noted   Elevated troponin 05/18/2024   Acute on chronic diastolic congestive heart failure (HCC) 05/18/2024   Hypophosphatemia 05/18/2024   Hypokalemia 05/18/2024   Moderate protein malnutrition (HCC) 05/18/2024   Pancytopenia (HCC) 05/18/2024   Failure to thrive in adult 05/17/2024   Loss of appetite 05/04/2024   Hypercalcemia 04/06/2024   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   Normocytic anemia 03/09/2024   GI bleed 03/09/2024   ABLA (acute blood loss anemia) 03/09/2024   Abnormal weight loss 03/08/2024   Ankle edema, bilateral 11/19/2023   Chronic diastolic CHF (congestive heart failure) (HCC) 10/22/2023   Closed displaced fracture of left femoral neck (HCC) 10/22/2023   Closed left subtrochanteric femur fracture (HCC) 09/26/2023   Bronchitis 08/04/2022   Ureteral stone 06/26/2022   Renal stones 06/26/2022   S/P cystoscopy 06/25/2022   Nephrolithiasis, 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, 5mm in size, leading to moderate left hydroureteronephrosis and significant left perinephric stranding /edema 06/09/2022   OSA (obstructive sleep apnea) 06/09/2022   Acquired trigger finger of right index finger 05/10/2021   Trigger thumb of left hand 05/10/2021   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   Persistent atrial fibrillation (HCC) 10/07/2020   CKD (chronic kidney disease), stage III (HCC) 05/20/2020   Apical variant hypertrophic cardiomyopathy (HCC)    Chest pain of uncertain etiology    Chest pain    S/P reverse total shoulder arthroplasty, 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   Encounter for orthopedic follow-up care 02/18/2020   PAF (paroxysmal atrial fibrillation) (HCC) 02/28/2019   Demand myocardial infarction (HCC) 02/25/2019   Pain in left knee 01/06/2018   Dysphonia 12/29/2017   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   Gastroesophageal reflux disease 11/06/2017   Bradycardia 12/27/2015   Exertional dyspnea 12/23/2014   Sinus bradycardia 09/13/2014   LUMBAGO 05/03/2010   VERTIGO 01/21/2009   TEMPOROMANDIBULAR JOINT PAIN 08/31/2008   Osteoarthritis 05/31/2008   TROCHANTERIC BURSITIS 05/31/2008   Essential hypertension 04/06/2007   CAD (coronary artery disease/elevated troponin 04/06/2007   ALLERGIC RHINITIS 04/06/2007   History of colonic polyps 04/06/2007   Hyperlipidemia 04/06/2007    Orientation RESPIRATION BLADDER Height & Weight     Self, Time, Situation  Normal External catheter Weight: 56.7 kg Height:  5' 7 (170.2 cm)  BEHAVIORAL SYMPTOMS/MOOD NEUROLOGICAL BOWEL NUTRITION STATUS      Continent Diet (regular)  AMBULATORY STATUS COMMUNICATION OF NEEDS Skin   Limited Assist Verbally Other (Comment) (erythema and dryness to buttocks and bilateral legs)                       Personal Care Assistance Level of Assistance  Bathing, Feeding, Dressing Bathing Assistance: Limited assistance Feeding assistance: Independent Dressing Assistance:  Limited assistance     Functional Limitations Info  Sight, Hearing, Speech Sight Info: Impaired (glasses) Hearing Info: Adequate Speech Info: Adequate    SPECIAL CARE FACTORS FREQUENCY  PT (By licensed PT), OT (By licensed OT)     PT Frequency:  5x/week OT Frequency: 5x/week            Contractures Contractures Info: Not present    Additional Factors Info  Code Status, Allergies Code Status Info: Full Allergies Info: Lidocaine , Morphine , Procaine Hcl, Sulfonamide Derivatives, Amlodipine , Ezetimibe-simvastatin, Latex, Norco (Hydrocodone -acetaminophen ), Tramadol , Tape           Current Medications (05/20/2024):  This is the current hospital active medication list Current Facility-Administered Medications  Medication Dose Route Frequency Provider Last Rate Last Admin   acetaminophen  (TYLENOL ) tablet 650 mg  650 mg Oral Q6H PRN Celinda Alm Lot, MD       Or   acetaminophen  (TYLENOL ) suppository 650 mg  650 mg Rectal Q6H PRN Celinda Alm Lot, MD       apixaban  (ELIQUIS ) tablet 2.5 mg  2.5 mg Oral BID Celinda Alm Lot, MD   2.5 mg at 05/20/24 1006   empagliflozin  (JARDIANCE ) tablet 10 mg  10 mg Oral Daily Celinda Alm Lot, MD   10 mg at 05/20/24 1014   isosorbide  mononitrate (IMDUR ) 24 hr tablet 30 mg  30 mg Oral Daily Celinda Alm Lot, MD   30 mg at 05/20/24 1006   lip balm (CARMEX) ointment   Topical PRN Raford Riggs, MD   Given at 05/18/24 1727   megestrol  (MEGACE ) tablet 40 mg  40 mg Oral Daily Celinda Alm Lot, MD   40 mg at 05/20/24 1014   metoprolol  succinate (TOPROL -XL) 24 hr tablet 25 mg  25 mg Oral Daily Celinda Alm Lot, MD   25 mg at 05/20/24 1006   mirtazapine  (REMERON ) tablet 15 mg  15 mg Oral QHS Celinda Alm Lot, MD   15 mg at 05/19/24 2135   ondansetron  (ZOFRAN ) tablet 4 mg  4 mg Oral Q6H PRN Celinda Alm Lot, MD       Or   ondansetron  (ZOFRAN ) injection 4 mg  4 mg Intravenous Q6H PRN Celinda Alm Lot, MD       pantoprazole  (PROTONIX ) EC tablet 40 mg  40 mg Oral Daily Celinda Alm Lot, MD   40 mg at 05/20/24 1006   polyethylene glycol (MIRALAX  / GLYCOLAX ) packet 17 g  17 g Oral BID Sebastian Toribio GAILS, MD   17 g at 05/20/24 1006   senna-docusate (Senokot-S) tablet 1 tablet   1 tablet Oral BID Sebastian Toribio GAILS, MD   1 tablet at 05/20/24 1006     Discharge Medications: Please see discharge summary for a list of discharge medications.  Relevant Imaging Results:  Relevant Lab Results:   Additional Information SSN [id number]  Sonda Manuella Quill, RN

## 2024-05-20 NOTE — Progress Notes (Signed)
 Occupational Therapy Treatment Patient Details Name: Barbara [name] MRN: [id number] DOB: 1940/12/12 Today's Date: 05/20/2024   History of present illness 83 yr old female  who was sent to the emergency department by recommendation of her primary care provider due to exertional dyspnea, lower extremity edema, failure to thrive with poor appetite and muscle pains. She was found to have CHF, malnutrition, and failure to thrive. Pt with medical history significant of bilateral saphenous ablation, CAD, NSTEMI, chronic diastolic CHF heart murmur, pacemaker placement, dyspnea, hypertension, hyperlipidemia, colon polyps, GERD, nephrolithiasis, osteoarthritis, history of pneumonia, sleep apnea, hypercalcemia   OT comments  The pt presented with good effort and participation in the session. She was seen for ADL instruction and progression of participation in ADLs. She required assistance for toileting management at bathroom level, as well as intermittent steadying assist to perform grooming in standing at the sink. She reported feelings of L knee stiffness, which she attributed to baseline arthritis. She will continue to benefit from OT services to maximize her ADL performance and to decrease the risk for further weakness. Patient will benefit from continued inpatient follow up therapy, <3 hours/day.       If plan is discharge home, recommend the following:  A lot of help with walking and/or transfers;A lot of help with bathing/dressing/bathroom;Assistance with cooking/housework   Equipment Recommendations  Other (comment) (to be determined, pending progress at next level of care)    Recommendations for Other Services      Precautions / Restrictions Precautions Precautions: Fall Restrictions Weight Bearing Restrictions Per Provider Order: No       Mobility Bed Mobility Overal bed mobility: Needs Assistance Bed Mobility: Supine to Sit     Supine to sit: Mod assist, Used rails, HOB  elevated     General bed mobility comments: assist to initiate roll and to raise trunk    Transfers Overall transfer level: Needs assistance Equipment used: Rolling walker (2 wheels) Transfers: Sit to/from Stand Sit to Stand: From elevated surface, Min assist           General transfer comment:  (She required verbal cues for upright posture/trunk extension once in standing)     Balance     Sitting balance-Leahy Scale: Good       Standing balance-Leahy Scale: Poor                             ADL either performed or assessed with clinical judgement   ADL Overall ADL's : Needs assistance/impaired     Grooming: Contact guard assist;Standing Grooming Details (indicate cue type and reason): She required intermittent steadying assist, in order to perform hand washing at sink level. She was instructed to step closer to the sink while performing, to decrease risk for falls or loss of balance.                 Toilet Transfer: Regular Toilet;Rolling walker (2 wheels);Ambulation;Grab bars;Moderate assistance;Cueing for sequencing Toilet Transfer Details (indicate cue type and reason): She required increased assist to transfer off of the lower toilet surface, with cues provided for bending the knees, trunk shifting forward, and pulling on grab bar. Toileting- Clothing Manipulation and Hygiene: Moderate assistance;Sit to/from stand Toileting - Clothing Manipulation Details (indicate cue type and reason): She required assist to pull down up when transferring onto the toilet, and down once in standing. She required steadying assist in standing, while she performed anterior hygiene after she urinated.  Vision Baseline Vision/History: 1 Wears glasses           Communication Communication Communication: No apparent difficulties   Cognition Arousal: Alert Behavior During Therapy: WFL for tasks assessed/performed          Following commands:  Intact                      Pertinent Vitals/ Pain       Pain Assessment Pain Assessment: No/denies pain   Frequency  Min 2X/week        Progress Toward Goals  OT Goals(current goals can now be found in the care plan section)     Acute Rehab OT Goals OT Goal Formulation: With patient Time For Goal Achievement: 06/02/24 Potential to Achieve Goals: Good  Plan         AM-PAC OT 6 Clicks Daily Activity     Outcome Measure   Help from another person eating meals?: None Help from another person taking care of personal grooming?: A Little Help from another person toileting, which includes using toliet, bedpan, or urinal?: A Lot Help from another person bathing (including washing, rinsing, drying)?: A Lot Help from another person to put on and taking off regular upper body clothing?: A Little Help from another person to put on and taking off regular lower body clothing?: A Lot 6 Click Score: 16    End of Session Equipment Utilized During Treatment: Gait belt;Rolling walker (2 wheels)  OT Visit Diagnosis: Muscle weakness (generalized) (M62.81);Adult, failure to thrive (R62.7);Unsteadiness on feet (R26.81)   Activity Tolerance Patient tolerated treatment well   Patient Left in chair;with call bell/phone within reach;with chair alarm set   Nurse Communication Mobility status        Time: 1036-1100 OT Time Calculation (min): 24 min  Charges: OT General Charges $OT Visit: 1 Visit OT Treatments $Self Care/Home Management : 8-22 mins $Therapeutic Activity: 8-22 mins     Delanna LITTIE Molt, OTR/L 05/20/2024, 1:03 PM

## 2024-05-21 ENCOUNTER — Observation Stay (HOSPITAL_COMMUNITY)

## 2024-05-21 DIAGNOSIS — I480 Paroxysmal atrial fibrillation: Secondary | ICD-10-CM | POA: Diagnosis not present

## 2024-05-21 DIAGNOSIS — E7800 Pure hypercholesterolemia, unspecified: Secondary | ICD-10-CM | POA: Diagnosis not present

## 2024-05-21 DIAGNOSIS — R627 Adult failure to thrive: Secondary | ICD-10-CM | POA: Diagnosis not present

## 2024-05-21 LAB — BASIC METABOLIC PANEL WITH GFR
Anion gap: 8 (ref 5–15)
BUN: 28 mg/dL — ABNORMAL HIGH (ref 8–23)
CO2: 27 mmol/L (ref 22–32)
Calcium: 10.6 mg/dL — ABNORMAL HIGH (ref 8.9–10.3)
Chloride: 103 mmol/L (ref 98–111)
Creatinine, Ser: 1.44 mg/dL — ABNORMAL HIGH (ref 0.44–1.00)
GFR, Estimated: 36 mL/min — ABNORMAL LOW (ref >60)
Glucose, Bld: 113 mg/dL — ABNORMAL HIGH (ref 70–99)
Potassium: 3.9 mmol/L (ref 3.5–5.1)
Sodium: 138 mmol/L (ref 135–145)

## 2024-05-21 LAB — CBC
HCT: 34.7 % — ABNORMAL LOW (ref 36.0–46.0)
Hemoglobin: 10.9 g/dL — ABNORMAL LOW (ref 12.0–15.0)
MCH: 28.3 pg (ref 26.0–34.0)
MCHC: 31.4 g/dL (ref 30.0–36.0)
MCV: 90.1 fL (ref 80.0–100.0)
Platelets: 131 K/uL — ABNORMAL LOW (ref 150–400)
RBC: 3.85 MIL/uL — ABNORMAL LOW (ref 3.87–5.11)
RDW: 19.7 % — ABNORMAL HIGH (ref 11.5–15.5)
WBC: 4.3 K/uL (ref 4.0–10.5)
nRBC: 0 % (ref 0.0–0.2)

## 2024-05-21 NOTE — Procedures (Signed)
 Modified Barium Swallow Study  Patient Details  Name: Barbara [name] MRN: [id number] Date of Birth: October 04, 1941  Today's Date: 05/21/2024  Modified Barium Swallow completed.  Full report located under Chart Review in the Imaging Section.  History of Present Illness 83 yo female adm to [facility] for weakness and FTT.  Pt recently dc from hospital 7/5 after admit with bilateral LE edema, weakness, FTT, Per note, son was worried that pt may be taking too many medications?  Pt with h/o chronic pain, recently weaned off Ativan , CT brain negative.  PET scan negative - conducted due to concern for multiple myeloma.  Pt also reported hoarseness to cardiologist - saying her left vocal cord is affected every surgery or intubation. She was scheduled for surgery one month ago but did not have it due to BP issues.  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  From chart, ? if pt was to have vocal cord injection by Dr Carlie in the past - but it was deferred due to BP.  Pt has had prior vocal fold injections by Dr Carlie- ? last being March 2019. CXR negative upon admit.   Clinical Impression Patient presents with functional oropharyngeal swallow ability without aspiration/penetration of any consistency tested. She does have minimal tongue base and vallecular retention (across all boluses)= effortful swallow nor chin tuck helpful to prevent retention but reflexive dry swallow and liquid swallow helpful to clear.  Slightly decreased laryngeal closure/elevation noted but did not result in airway compromise even when taxed with ultrathin barium.  Barium tablet taken with thin appeared to stall at GE without pt sensation and pudding cleared it.  Of note, pt did cough with thin water  post- MBS. Could not test A-P  view due to the chair pt was placed in for the test.  Of note, pt's largest complaint is her voice - not dysphagia.  Recommended she follow up with ENT. Factors that may increase risk of adverse event in presence of aspiration Barbara [name] & Barbara [name] 2021): Frail or deconditioned;Limited mobility  Swallow Evaluation Recommendations Recommendations: PO diet PO Diet Recommendation: Regular;Thin liquids (Level 0) Liquid Administration via: Straw;Cup Medication Administration: Whole meds with liquid (follow with pudding -) Swallowing strategies  : Slow rate;Small bites/sips (start all intake with liquids, try head turn left if helps prevents cough with liquids) Postural changes: Stay upright 30-60 min after meals;Position pt fully upright for meals Oral care recommendations: Oral care BID (2x/day)    Barbara [name] POUR, MS [facility] SLP Acute Rehab Services Office [phone]   Barbara [name] Barbara [name] 05/21/2024,1:50 PM

## 2024-05-21 NOTE — Progress Notes (Signed)
 Physical Therapy Treatment Patient Details Name: Barbara [name] MRN: [id number] DOB: 19-Oct-1941 Today's Date: 05/21/2024   History of Present Illness 83 y.o. female  who was sent to the emergency department by recommendation of her primary care provider due to exertional dyspnea, lower extremity edema, failure to thrive with poor appetite and muscle pains. Dx of CHF, malnutrition, FTT. Pt with medical history significant of bilateral saphenous ablation, CAD, NSTEMI, chronic diastolic CHF heart murmur, pacemaker placement, dyspnea, hypertension, hyperlipidemia, colon polyps, GERD, nephrolithiasis, osteoarthritis, history of pneumonia, sleep apnea, hypercalcemia    PT Comments  Pt assisted to [facility] per request and required assist for pericare. Pt then ambulated a couple feet from [facility] to recliner.  Pt fatigued quickly but interested in ambulating later.  Will check with mobility specialist for possible afternoon mobilization.  Pt does require some assist still for OOB activity and current d/c plan remains appropriate.  Patient will benefit from continued inpatient follow up therapy, <3 hours/day.      If plan is discharge home, recommend the following: A lot of help with walking and/or transfers;A lot of help with bathing/dressing/bathroom;Assistance with cooking/housework;Assist for transportation;Help with stairs or ramp for entrance   Can travel by private vehicle        Equipment Recommendations  None recommended by PT    Recommendations for Other Services       Precautions / Restrictions Precautions Precautions: Fall Recall of Precautions/Restrictions: Intact     Mobility  Bed Mobility Overal bed mobility: Needs Assistance Bed Mobility: Supine to Sit Rolling: Min assist, Used rails Sidelying to sit: Min assist, Used rails, HOB elevated       General bed mobility comments: cues for technique, assist for trunk    Transfers Overall transfer level: Needs  assistance Equipment used: Rolling walker (2 wheels) Transfers: Sit to/from Stand Sit to Stand: Min assist   Step pivot transfers: Min assist       General transfer comment: multimodal cues for technique and safety, assist to rise and stabilize; bed to [facility]    Ambulation/Gait Ambulation/Gait assistance: Min assist Gait Distance (Feet): 4 Feet Assistive device: Rolling walker (2 wheels) Gait Pattern/deviations: Step-through pattern, Decreased stride length, Trunk flexed Gait velocity: decr     General Gait Details: approx 4 ft from [facility] to recliner, cues for upright posture and RW positioning   Stairs             Wheelchair Mobility     Tilt Bed    Modified Rankin (Stroke Patients Only)       Balance Overall balance assessment: Needs assistance         Standing balance support: Bilateral upper extremity supported, Reliant on assistive device for balance, During functional activity Standing balance-Leahy Scale: Poor                              Communication Communication Communication: No apparent difficulties  Cognition Arousal: Alert Behavior During Therapy: WFL for tasks assessed/performed                             Following commands: Intact      Cueing Cueing Techniques: Verbal cues  Exercises      General Comments        Pertinent Vitals/Pain Pain Assessment Pain Assessment: Faces Faces Pain Scale: Hurts little more Pain Location: right neck and shoulder pain (reports RN aware) Pain  Descriptors / Indicators: Tender, Aching Pain Intervention(s): Repositioned, Monitored during session    Home Living                          Prior Function            PT Goals (current goals can now be found in the care plan section) Progress towards PT goals: Progressing toward goals    Frequency    Min 2X/week      PT Plan      Co-evaluation              AM-PAC PT 6 Clicks Mobility    Outcome Measure  Help needed turning from your back to your side while in a flat bed without using bedrails?: A Little Help needed moving from lying on your back to sitting on the side of a flat bed without using bedrails?: A Lot Help needed moving to and from a bed to a chair (including a wheelchair)?: A Lot Help needed standing up from a chair using your arms (e.g., wheelchair or bedside chair)?: A Lot Help needed to walk in hospital room?: A Lot Help needed climbing 3-5 steps with a railing? : Total 6 Click Score: 12    End of Session Equipment Utilized During Treatment: Gait belt Activity Tolerance: Patient tolerated treatment well;Patient limited by fatigue Patient left: in chair;with call bell/phone within reach;with chair alarm set   PT Visit Diagnosis: Unsteadiness on feet (R26.81);Muscle weakness (generalized) (M62.81);Difficulty in walking, not elsewhere classified (R26.2)     Time: 8889-8867 PT Time Calculation (min) (ACUTE ONLY): 22 min  Charges:    $Therapeutic Activity: 8-22 mins PT General Charges $$ ACUTE PT VISIT: 1 Visit                     Barbara PT, DPT Physical Therapist Acute Rehabilitation Services Office: [phone]    Barbara [name] 05/21/2024, 1:35 PM

## 2024-05-21 NOTE — Progress Notes (Addendum)
 SLP Cancellation Note  Patient Details Name: Barbara [name] MRN: [id number] DOB: September 22, 1941   Cancelled treatment:       Reason Eval/Treat Not Completed: Other (comment) (SLP went to follow up with pt and she is now needing bedpan, will attempt to return - RN reports she is still struggling with swallowing) Left new swallow precautions signs with RN for pt and HOB.    [name], MS [facility] SLP Acute Rehab Services Office [phone]  [name] 05/21/2024, 3:48 PM

## 2024-05-21 NOTE — TOC Progression Note (Signed)
 Transition of Care [facility]) - Progression Note   Patient Details  Name: Barbara [name] MRN: [id number] Date of Birth: 08-09-1941  [facility]) CM/SW Contact  Duwaine GORMAN Aran, LCSW Phone Number: 05/21/2024, 3:23 PM  Clinical Narrative: Patient is expected to be medically ready this weekend. Patient received bed offers from [place] and [place]. Patient chose [facility]. CSW confirmed bed with admissions. Patient be be admitted on Saturday or Monday, but the facility cannot accommodate a Sunday admission.  CSW called [employer] and spoke with [place] to start insurance authorization for SNF and PTAR. TOC awaiting insurance approval.  Expected Discharge Plan: Skilled Nursing Facility Barriers to Discharge: Continued Medical Work up  Expected Discharge Plan and Services Living arrangements for the past 2 months: Single Family Home  Social Determinants of Health (SDOH) Interventions SDOH Screenings   Food Insecurity: No Food Insecurity (05/18/2024)  Housing: Low Risk  (05/18/2024)  Transportation Needs: Unmet Transportation Needs (05/18/2024)  Utilities: Not At Risk (05/18/2024)  Alcohol Screen: Low Risk  (08/05/2022)  Depression (PHQ2-9): Low Risk  (04/05/2024)  Financial Resource Strain: Low Risk  (08/05/2022)  Physical Activity: Insufficiently Active (07/02/2022)  Social Connections: Moderately Integrated (05/18/2024)  Stress: No Stress Concern Present (07/02/2022)  Tobacco Use: Medium Risk (05/20/2024)   Readmission Risk Interventions    03/11/2024    1:07 PM  Readmission Risk Prevention Plan  Transportation Screening Complete  PCP or Specialist Appt within 5-7 Days Complete  Home Care Screening Complete  Medication Review (RN CM) Complete

## 2024-05-21 NOTE — Progress Notes (Signed)
   05/21/24 0033  BiPAP/CPAP/SIPAP  BiPAP/CPAP/SIPAP Pt Type Adult  Reason BIPAP/CPAP not in use Other(comment) (patient stated she did not want to wear it tonight)  Dentures removed? Not applicable  BiPAP/CPAP /SiPAP Vitals  Resp (!) 24  MEWS Score/Color  MEWS Score 1  MEWS Score Color Barbara [name]

## 2024-05-21 NOTE — Progress Notes (Signed)
 PROGRESS NOTE    Barbara [name]  FMW:[id number] DOB: [birth year] DOA: 05/18/2024 PCP: Johnny Garnette LABOR, MD    Chief Complaint  Patient presents with   Leg Swelling    Brief Narrative:  Patient 83 year old female history of bilateral saphenous ablation, CAD, non-STEMI, chronic diastolic CHF, heart murmur, status post PPM, dyspnea, hypertension, hyperlipidemia, colonic polyps, GERD, nephrolithiasis, OA, hypercalcemia presented to the ED from PCPs office due to exertional dyspnea, lower extremity edema, failure to thrive with poor oral intake and muscle pain.  Chest x-ray done with no significant abnormalities noted.  Patient noted to have elevated troponin and admitted due to concerns for acute on chronic diastolic CHF and placed on IV Lasix .  Cardiology consulted and following.   Assessment & Plan:   Principal Problem:   Failure to thrive in adult Active Problems:   Abnormal weight loss   CAD (coronary artery disease/elevated troponin   PAF (paroxysmal atrial fibrillation) (HCC)   Essential hypertension   CKD (chronic kidney disease), stage III (HCC)   Hyperlipidemia   Gastroesophageal reflux disease   OSA (obstructive sleep apnea)   Hypercalcemia   Loss of appetite   Elevated troponin   Acute on chronic diastolic congestive heart failure (HCC)   Hypophosphatemia   Hypokalemia   Moderate protein malnutrition (HCC)   Pancytopenia (HCC)  #1 failure to thrive/moderate protein calorie malnutrition -??  Etiology. - May be secondary to persistent hypercalcemia. - Continue Megace , mirtazapine . - PT/OT. - SLP consulted and following.  2.  Acute on chronic diastolic CHF exacerbation/elevated troponin - Patient presented with dyspnea on exertion, some lower extremity edema. - 2D echo obtained with systolic left ventricular mid cavity obliteration with apical entrapment, no LV outflow obstruction, EF > 75%.  Severe asymmetric LVH of the apical segment.  Grade 2 diastolic  dysfunction.  Severely dilated left atrial size.  Mild MVR. - Patient noted to have been placed on Lasix  20 mg IV daily early on in the hospitalization with a urine output of 800 cc over the past 24 hours. -Patient seen in consultation by cardiology IV Lasix  have been discontinued and recommending Lasix  10 to 20 mg daily as needed for edema or shortness of breath. -Continue Jardiance , metoprolol , Imdur . - Per cardiology elevated troponin likely secondary to demand ischemia and no ischemic evaluation indicated at this time. - Patient still with complaints of shortness of breath on exertion and such we will check a CT chest.  3.  Left ankle swelling -2D echo with systolic left ventricular mid cavity obliteration with apical entrapment, no LV outflow obstruction, EF > 75%.  Severe asymmetric LVH of the apical segment.  Grade 2 diastolic dysfunction.  Severely dilated left atrial size.  Mild MVR. -Lower extremity Dopplers done negative for DVT. -Compression stockings, keep lower extremities elevated.  4.  Hypertension  - Imdur , metoprolol .    5.  Paroxysmal A-fib/PPM -Currently atrial paced. - Continue metoprolol  for rate control, Eliquis  for anticoagulation.  6.  Hypercalcemia - Noted to be chronic in nature. - Patient with negative workup for gammopathy. - Dr. Celinda discussed with Dr.Pasam who noted to have seen patient last month and recommended consideration for repeat chest CT to look for lymphadenopathy or sarcoidosis - Continue treatment of constipation. - Patient noted to have received lactulose .  7.  Hypophosphatemia - Repleted.   8.  Hypokalemia - Repleted.   9.  Pancytopenia -Dr. Celinda, discussed with Dr.Pasam of hematology/oncology who felt may be due to age or early MDS. -  May need to consider IR bone marrow biopsy. - Outpatient follow-up.  10.  CKD stage IIIb -Patient with slight bump in creatinine, IV diuresis held. - Creatinine trended back down.  11.   Hyperlipidemia -Statin on hold due to body aches. - Continue to hold until outpatient follow-up with PCP.  12.  GERD - PPI.    13.  OSA - Continue CPAP nightly.  14.  Hoarseness -Outpatient follow-up with ENT as previously scheduled.    DVT prophylaxis: Eliquis  Code Status: Full Family Communication: Updated patient.  No family at bedside. Disposition: SNF.  Status is: Observation The patient remains OBS appropriate and will d/c before 2 midnights.   Consultants:  Cardiology: Dr. Raford 05/18/2024  Procedures:  2D echo 05/18/2024 Chest x-ray 05/18/2024 Lower extremity Doppler 05/19/2024  Antimicrobials:  Anti-infectives (From admission, onward)    None         Subjective: Patient sitting up in recliner.  Patient still with complaints of shortness of breath on exertion.  Denies any chest pain.  No abdominal pain.  Stated had a pain going down from her right ear down her body this morning with some improvement on current pain regimen.  Tolerating current diet.   Objective: Vitals:   05/21/24 0033 05/21/24 0458 05/21/24 0500 05/21/24 1000  BP:  (!) 144/56  (!) 134/47  Pulse:  64  69  Resp: (!) 24 16  (!) 22  Temp:  98.8 F (37.1 C)  98.7 F (37.1 C)  TempSrc:  Oral  Oral  SpO2:  98%  98%  Weight:   58.2 kg   Height:        Intake/Output Summary (Last 24 hours) at 05/21/2024 1156 Last data filed at 05/21/2024 0952 Gross per 24 hour  Intake 360 ml  Output 700 ml  Net -340 ml   Filed Weights   05/19/24 0831 05/20/24 0600 05/21/24 0500  Weight: 54.4 kg 56.7 kg 58.2 kg    Examination:  General exam: NAD Respiratory system: Lungs clear to auscultation bilaterally.  No wheezes, no crackles, no rhonchi.  Fair air movement.  Speaking in full sentences.  Cardiovascular system: RRR. 3/6 SEM.No JVD, murmurs, rubs, gallops or clicks. No pedal edema. Gastrointestinal system: Abdomen is soft, nontender, nondistended, positive bowel sounds.  No rebound.  No  guarding.  Central nervous system: Alert and oriented. No focal neurological deficits. Extremities: Symmetric 5 x 5 power. Skin: No rashes, lesions or ulcers Psychiatry: Judgement and insight appear normal. Mood & affect appropriate.     Data Reviewed: I have personally reviewed following labs and imaging studies  CBC: Recent Labs  Lab 05/18/24 1132 05/19/24 0416 05/20/24 0423 05/21/24 0358  WBC 3.4* 2.8* 3.3* 4.3  HGB 11.1* 10.3* 10.4* 10.9*  HCT 35.4* 32.9* 33.2* 34.7*  MCV 89.6 90.6 88.8 90.1  PLT 119* 111* 115* 131*    Basic Metabolic Panel: Recent Labs  Lab 05/18/24 1132 05/18/24 1427 05/19/24 0416 05/20/24 0423 05/21/24 0358  NA 138  --  135 139 138  K 3.0*  --  3.5 3.7 3.9  CL 104  --  103 104 103  CO2 25  --  25 28 27   GLUCOSE 110*  --  107* 98 113*  BUN 28*  --  29* 30* 28*  CREATININE 1.61*  --  1.82* 1.47* 1.44*  CALCIUM  11.5*  --  10.8* 11.0* 10.6*  MG 1.8  --   --  2.2  --   PHOS  --  2.0* 2.1*  4.6  --     GFR: Estimated Creatinine Clearance: 26.2 mL/min (A) (by C-G formula based on SCr of 1.44 mg/dL (H)).  Liver Function Tests: Recent Labs  Lab 05/18/24 1132 05/19/24 0416 05/20/24 0423  AST 63* 58*  --   ALT 25 24  --   ALKPHOS 67 74  --   BILITOT 0.7 0.7  --   PROT 7.2 6.5  --   ALBUMIN 2.6* 2.5* 2.5*    CBG: Recent Labs  Lab 05/18/24 1114  GLUCAP 98     No results found for this or any previous visit (from the past 240 hours).       Radiology Studies: No results found.       Scheduled Meds:  apixaban   2.5 mg Oral BID   empagliflozin   10 mg Oral Daily   isosorbide  mononitrate  30 mg Oral Daily   megestrol   40 mg Oral Daily   metoprolol  succinate  25 mg Oral Daily   mirtazapine   15 mg Oral QHS   pantoprazole   40 mg Oral Daily   polyethylene glycol  17 g Oral BID   senna-docusate  1 tablet Oral BID   Continuous Infusions:     LOS: 0 days    Time spent: 40 minutes    Toribio Hummer, MD [facility]   To contact the attending provider between 7A-7P or the covering provider during after hours 7P-7A, please log into the web site [email or web address] and access using universal [place] password for that web site. If you do not have the password, please call the hospital operator.  05/21/2024, 11:56 AM

## 2024-05-21 NOTE — Progress Notes (Signed)
 Arrived back from Radiology at this time.  Transport method: wheelchair Accompanied by: Radiology RT.

## 2024-05-21 NOTE — Plan of Care (Signed)
 Alert, oriented.  Able to get OOB to chair and utilize bedside commode.  [employer] discontinued this shift.  MBS today, swallowing precautions placed at bedside per SLP.    Problem: Health Behavior/Discharge Planning: Goal: Ability to manage health-related needs will improve Outcome: Progressing   Problem: Clinical Measurements: Goal: Ability to maintain clinical measurements within normal limits will improve Outcome: Progressing Goal: Will remain free from infection Outcome: Progressing Goal: Diagnostic test results will improve Outcome: Progressing Goal: Respiratory complications will improve Outcome: Progressing

## 2024-05-21 NOTE — Progress Notes (Signed)
 Transported off unit to Radiology.  Transport method: wheelchair Accompanied by: Patient Transporter

## 2024-05-22 ENCOUNTER — Observation Stay (HOSPITAL_COMMUNITY)

## 2024-05-22 DIAGNOSIS — M6281 Muscle weakness (generalized): Secondary | ICD-10-CM | POA: Diagnosis not present

## 2024-05-22 DIAGNOSIS — R7989 Other specified abnormal findings of blood chemistry: Secondary | ICD-10-CM | POA: Diagnosis present

## 2024-05-22 DIAGNOSIS — Z681 Body mass index (BMI) 19 or less, adult: Secondary | ICD-10-CM | POA: Diagnosis not present

## 2024-05-22 DIAGNOSIS — N1832 Chronic kidney disease, stage 3b: Secondary | ICD-10-CM | POA: Diagnosis not present

## 2024-05-22 DIAGNOSIS — K219 Gastro-esophageal reflux disease without esophagitis: Secondary | ICD-10-CM | POA: Diagnosis not present

## 2024-05-22 DIAGNOSIS — R531 Weakness: Secondary | ICD-10-CM | POA: Diagnosis not present

## 2024-05-22 DIAGNOSIS — R0600 Dyspnea, unspecified: Secondary | ICD-10-CM | POA: Diagnosis not present

## 2024-05-22 DIAGNOSIS — K5900 Constipation, unspecified: Secondary | ICD-10-CM | POA: Diagnosis not present

## 2024-05-22 DIAGNOSIS — I480 Paroxysmal atrial fibrillation: Secondary | ICD-10-CM | POA: Diagnosis not present

## 2024-05-22 DIAGNOSIS — I2489 Other forms of acute ischemic heart disease: Secondary | ICD-10-CM | POA: Diagnosis not present

## 2024-05-22 DIAGNOSIS — R1313 Dysphagia, pharyngeal phase: Secondary | ICD-10-CM | POA: Diagnosis not present

## 2024-05-22 DIAGNOSIS — K117 Disturbances of salivary secretion: Secondary | ICD-10-CM | POA: Diagnosis not present

## 2024-05-22 DIAGNOSIS — E785 Hyperlipidemia, unspecified: Secondary | ICD-10-CM | POA: Diagnosis not present

## 2024-05-22 DIAGNOSIS — I272 Pulmonary hypertension, unspecified: Secondary | ICD-10-CM | POA: Diagnosis not present

## 2024-05-22 DIAGNOSIS — D61818 Other pancytopenia: Secondary | ICD-10-CM | POA: Diagnosis not present

## 2024-05-22 DIAGNOSIS — I5033 Acute on chronic diastolic (congestive) heart failure: Secondary | ICD-10-CM | POA: Diagnosis not present

## 2024-05-22 DIAGNOSIS — J3801 Paralysis of vocal cords and larynx, unilateral: Secondary | ICD-10-CM | POA: Diagnosis not present

## 2024-05-22 DIAGNOSIS — E440 Moderate protein-calorie malnutrition: Secondary | ICD-10-CM | POA: Diagnosis not present

## 2024-05-22 DIAGNOSIS — J90 Pleural effusion, not elsewhere classified: Secondary | ICD-10-CM | POA: Diagnosis not present

## 2024-05-22 DIAGNOSIS — E7800 Pure hypercholesterolemia, unspecified: Secondary | ICD-10-CM | POA: Diagnosis not present

## 2024-05-22 DIAGNOSIS — I495 Sick sinus syndrome: Secondary | ICD-10-CM | POA: Diagnosis not present

## 2024-05-22 DIAGNOSIS — I2510 Atherosclerotic heart disease of native coronary artery without angina pectoris: Secondary | ICD-10-CM | POA: Diagnosis not present

## 2024-05-22 DIAGNOSIS — R41841 Cognitive communication deficit: Secondary | ICD-10-CM | POA: Diagnosis not present

## 2024-05-22 DIAGNOSIS — E876 Hypokalemia: Secondary | ICD-10-CM | POA: Diagnosis not present

## 2024-05-22 DIAGNOSIS — R630 Anorexia: Secondary | ICD-10-CM

## 2024-05-22 DIAGNOSIS — Z7401 Bed confinement status: Secondary | ICD-10-CM | POA: Diagnosis not present

## 2024-05-22 DIAGNOSIS — I252 Old myocardial infarction: Secondary | ICD-10-CM | POA: Diagnosis not present

## 2024-05-22 DIAGNOSIS — R627 Adult failure to thrive: Secondary | ICD-10-CM | POA: Diagnosis not present

## 2024-05-22 DIAGNOSIS — I130 Hypertensive heart and chronic kidney disease with heart failure and stage 1 through stage 4 chronic kidney disease, or unspecified chronic kidney disease: Secondary | ICD-10-CM | POA: Diagnosis not present

## 2024-05-22 DIAGNOSIS — R2689 Other abnormalities of gait and mobility: Secondary | ICD-10-CM | POA: Diagnosis not present

## 2024-05-22 DIAGNOSIS — I422 Other hypertrophic cardiomyopathy: Secondary | ICD-10-CM | POA: Diagnosis not present

## 2024-05-22 DIAGNOSIS — G4733 Obstructive sleep apnea (adult) (pediatric): Secondary | ICD-10-CM | POA: Diagnosis not present

## 2024-05-22 LAB — CBC
HCT: 33.4 % — ABNORMAL LOW (ref 36.0–46.0)
Hemoglobin: 10.2 g/dL — ABNORMAL LOW (ref 12.0–15.0)
MCH: 27.6 pg (ref 26.0–34.0)
MCHC: 30.5 g/dL (ref 30.0–36.0)
MCV: 90.3 fL (ref 80.0–100.0)
Platelets: 141 K/uL — ABNORMAL LOW (ref 150–400)
RBC: 3.7 MIL/uL — ABNORMAL LOW (ref 3.87–5.11)
RDW: 19.3 % — ABNORMAL HIGH (ref 11.5–15.5)
WBC: 4.4 K/uL (ref 4.0–10.5)
nRBC: 0 % (ref 0.0–0.2)

## 2024-05-22 LAB — BASIC METABOLIC PANEL WITH GFR
Anion gap: 6 (ref 5–15)
BUN: 24 mg/dL — ABNORMAL HIGH (ref 8–23)
CO2: 26 mmol/L (ref 22–32)
Calcium: 10.4 mg/dL — ABNORMAL HIGH (ref 8.9–10.3)
Chloride: 103 mmol/L (ref 98–111)
Creatinine, Ser: 1.35 mg/dL — ABNORMAL HIGH (ref 0.44–1.00)
GFR, Estimated: 39 mL/min — ABNORMAL LOW (ref >60)
Glucose, Bld: 97 mg/dL (ref 70–99)
Potassium: 3.7 mmol/L (ref 3.5–5.1)
Sodium: 135 mmol/L (ref 135–145)

## 2024-05-22 MED ORDER — SENNOSIDES-DOCUSATE SODIUM 8.6-50 MG PO TABS: 1.0000 | ORAL_TABLET | Freq: Two times a day (BID) | ORAL | Status: AC

## 2024-05-22 MED ORDER — ACETAMINOPHEN 325 MG PO TABS: 650.0000 mg | ORAL_TABLET | Freq: Four times a day (QID) | ORAL | Status: AC | PRN

## 2024-05-22 MED ORDER — POLYETHYLENE GLYCOL 3350 17 G PO PACK: 17.0000 g | PACK | Freq: Two times a day (BID) | ORAL | Status: DC

## 2024-05-22 NOTE — Progress Notes (Signed)
   05/22/24 2244  BiPAP/CPAP/SIPAP  Reason BIPAP/CPAP not in use Other(comment) (PATIENT DOES NOT HAVE HOME UNIT ON AT THIS TIME. I ASKED HER IF SHE WANTED TO WEAR IT AND SHE SAID NO.)

## 2024-05-22 NOTE — Progress Notes (Signed)
 Mobility Specialist - Progress Note   05/22/24 0857  Mobility  Activity Ambulated with assistance in room  Level of Assistance Contact guard assist, steadying assist  Assistive Device Front wheel walker  Distance Ambulated (ft) 3 ft  Range of Motion/Exercises Active  Activity Response Tolerated well  Mobility Referral Yes  Mobility visit 1 Mobility  Mobility Specialist Start Time (ACUTE ONLY) 0840  Mobility Specialist Stop Time (ACUTE ONLY) 0857  Mobility Specialist Time Calculation (min) (ACUTE ONLY) 17 min   Pt was found in bed and agreeable to mobilize. Stated being saturated and assisted with pericare. Afterwards pt able to ambulate to recliner chair. Was left with all needs met. Call bell in reach and chair alarm on.  Erminio Leos,  Mobility Specialist Can be reached via Secure Chat

## 2024-05-22 NOTE — Discharge Summary (Signed)
 Physician Discharge Summary  NOELLA KIPNIS FMW:[id number] DOB: 11/20/1940 DOA: 05/18/2024  PCP: Johnny Garnette LABOR, MD  Admit date: 05/18/2024 Discharge date: 05/22/2024  Time spent: 60 minutes  Recommendations for Outpatient Follow-up:  Follow-up with MD at skilled nursing facility.  Patient will need a basic metabolic profile, magnesium  level, phosphorus level checked in 1 week to follow-up on electrolytes and renal function. Follow-up with Dr. Vaughan Ricker, ENT as scheduled.   Discharge Diagnoses:  Principal Problem:   Failure to thrive in adult Active Problems:   Abnormal weight loss   CAD (coronary artery disease/elevated troponin   PAF (paroxysmal atrial fibrillation) (HCC)   Essential hypertension   CKD (chronic kidney disease), stage III (HCC)   Hyperlipidemia   Gastroesophageal reflux disease   OSA (obstructive sleep apnea)   Hypercalcemia   Loss of appetite   Elevated troponin   Acute on chronic diastolic congestive heart failure (HCC)   Hypophosphatemia   Hypokalemia   Moderate protein malnutrition (HCC)   Pancytopenia (HCC)   Discharge Condition: Stable and improved.  Diet recommendation: Heart healthy  Filed Weights   05/20/24 0600 05/21/24 0500 05/22/24 0500  Weight: 56.7 kg 58.2 kg 59.1 kg    History of present illness:  HPI per Dr. Celinda Charolette ISOBEL EISENHUTH is a 83 y.o. female with medical history significant of bilateral saphenous ablation, CAD, NSTEMI, chronic diastolic CHF heart murmur, pacemaker placement, dyspnea, hypertension, hyperlipidemia, colon polyps, GERD, nephrolithiasis, osteoarthritis, history of pneumonia, sleep apnea, hypercalcemia who was sent to the emergency department by recommendation of her primary care provider due to exertional dyspnea, lower extremity edema, failure to thrive with poor appetite and muscle pains.  The patient has been hypercalcemic for several months, but PTH and PTH like peptide were normal.  She also has  CT chest and CT abdomen/pelvis with no acute abnormalities.  She was seen by hematology last month and there was no monoclonal gammopathy.  She denied fever, chills, rhinorrhea, sore throat, wheezing or hemoptysis. No chest pain, palpitations, diaphoresis, PND, orthopnea, but has had progressively worse pitting edema of the lower extremities in the last few weeks.  Her appetite is decreased and she has been constipated for over a week.  She gets occasional dyspnea, which she attributes to a long history of vocal cord dysfunction, but could not specify when did this happen.  No abdominal pain, nausea, emesis, diarrhea, melena or hematochezia.  No flank pain, dysuria, frequency or hematuria.  No polyuria, polydipsia, polyphagia or blurred vision.    Lab work: CBC showed white count of 3.4, hemoglobin 11.1 g/dL platelets 880.  PT 16.3, INR 1.2.  Troponin level is 238 ng/L with second set pending.  CMP shows sodium of 138, potassium 3.0, chloride 104 and CO2 25 mmol/L.  Glucose on 910, BUN 28, creatinine 1.61 and corrected calcium  12.6 mg/dL.  Total protein 7.2 and albumin 2.6 g/dL.  AST was 63 units/L, the rest of the LFTs were normal.   Imaging: Portable 1 view chest radiograph with normal cardiomediastinal silhouette and pulmonary vascular status within normal limits.  No acute cardiopulmonary process.   ED course: Initial vital signs were temperature 98.7 F, pulse 86, respiration 18, BP 131/60 mmHg O2 sat 98% on room air.  The patient received aspirin  324 mg, LR 1000 mm bolus, magnesium  oxide 100 mg p.o. and KCl 40 mill equivalents p.o. x 1.  She received aspirin  324 mg p.o., LR 1000 mL bolus, mag oxide 100 mg p.o. x 1 and KCl  40 mill equivalents p.o. x 1.  Hospital Course:  #1 failure to thrive/moderate protein calorie malnutrition -??  Etiology. - May be secondary to persistent hypercalcemia. - Patient maintained on home regimen Megace , mirtazapine . - PT/OT. - SLP consulted and following.   2.   Acute on chronic diastolic CHF exacerbation/elevated troponin - Patient presented with dyspnea on exertion, some lower extremity edema. - 2D echo obtained with systolic left ventricular mid cavity obliteration with apical entrapment, no LV outflow obstruction, EF > 75%.  Severe asymmetric LVH of the apical segment.  Grade 2 diastolic dysfunction.  Severely dilated left atrial size.  Mild MVR. - Patient noted to have been placed on Lasix  20 mg IV daily early on in the hospitalization. -Patient seen in consultation by cardiology IV Lasix  were subsequently discontinued and recommended Lasix  10 to 20 mg daily as needed for edema or shortness of breath. - Patient maintained on home regimen Jardiance , metoprolol , Imdur . - Per cardiology elevated troponin likely secondary to demand ischemia and no ischemic evaluation indicated at this time. - Patient still with complaints of shortness of breath on exertion and such CT chest without contrast was obtained which was unremarkable.   - Outpatient follow-up.     3.  Left ankle swelling -2D echo with systolic left ventricular mid cavity obliteration with apical entrapment, no LV outflow obstruction, EF > 75%.  Severe asymmetric LVH of the apical segment.  Grade 2 diastolic dysfunction.  Severely dilated left atrial size.  Mild MVR. -Lower extremity Dopplers done negative for DVT. -Compression stockings, keep lower extremities elevated.   4.  Hypertension  - Patient maintained on home regimen Imdur , metoprolol .     5.  Paroxysmal A-fib/PPM - Patient remained atrial paced during the hospitalization.   - Patient maintained on home regimen of metoprolol  for rate control, Eliquis  for anticoagulation.    6.  Hypercalcemia - Noted to be chronic in nature. - Patient with negative workup for gammopathy. - Dr. Celinda discussed with Dr.Pasam who noted to have seen patient last month and recommended consideration for repeat chest CT to look for lymphadenopathy or  sarcoidosis -Repeat CT chest done without contrast showed no acute findings, small right pleural effusion noted. -Patient placed on bowel regimen for constipation. -Outpatient follow-up with PCP.   7.  Hypophosphatemia - Repleted.    8.  Hypokalemia - Repleted during the hospitalization.    9.  Pancytopenia -Dr. Celinda, discussed with Dr.Pasam of hematology/oncology who felt may be due to age or early MDS. - May need to consider IR bone marrow biopsy. - Outpatient follow-up.   10.  CKD stage IIIb -Patient with slight bump in creatinine, IV diuresis held. - Creatinine trended back down.   11.  Hyperlipidemia -Statin on hold due to body aches. - Continue to hold until outpatient follow-up with PCP.   12.  GERD - Patient maintained on PPI.     13.  OSA -Patient maintained on CPAP nightly.   14.  Hoarseness -Outpatient follow-up with ENT as previously scheduled.      Procedures: 2D echo 05/18/2024 Chest x-ray 05/18/2024 Lower extremity Doppler 05/19/2024 CT chest without contrast 05/21/2024  Consultations: Cardiology: Dr. Raford 05/18/2024    Discharge Exam: Vitals:   05/22/24 0943 05/22/24 1125  BP: 132/62 (!) 109/56  Pulse: 60 60  Resp:  18  Temp:  98.5 F (36.9 C)  SpO2:  97%    General: NAD Cardiovascular: RRR no murmurs rubs or gallops.  No JVD.  No pitting  lower extremity edema. Respiratory: Clear to auscultation bilaterally.  No wheezes, no crackles, no rhonchi.  Fair air movement.  Speaking in full sentences.  Discharge Instructions   Discharge Instructions     Diet - low sodium heart healthy   Complete by: As directed    Increase activity slowly   Complete by: As directed       Allergies as of 05/22/2024       Reactions   Lidocaine  Anaphylaxis   Morphine  Other (See Comments)   Body shuts down   Procaine Hcl Anaphylaxis, Rash, Other (See Comments)   Anything with 'caine' in it    Sulfonamide Derivatives Hives   Amlodipine  Swelling    Ezetimibe-simvastatin Other (See Comments)   VYTORIN = Joint pain   Latex Itching, Other (See Comments)   WELTS   Norco [hydrocodone -acetaminophen ] Nausea And Vomiting, Other (See Comments)   States does ok with IV form    Tramadol  Nausea And Vomiting   Tape Itching, Other (See Comments)   Patient prefers either paper tape or Coban wrap        Medication List     STOP taking these medications    hydrALAZINE  100 MG tablet Commonly known as: APRESOLINE    LORazepam  0.5 MG tablet Commonly known as: ATIVAN    rosuvastatin  40 MG tablet Commonly known as: CRESTOR        TAKE these medications    acetaminophen  325 MG tablet Commonly known as: TYLENOL  Take 2 tablets (650 mg total) by mouth every 6 (six) hours as needed for mild pain (pain score 1-3) or fever (or Fever >/= 101).   alum & mag hydroxide-simeth 200-200-20 MG/5ML suspension Commonly known as: MAALOX/MYLANTA Take 30 mLs by mouth every 4 (four) hours as needed for indigestion.   apixaban  2.5 MG Tabs tablet Commonly known as: Eliquis  Take 1 tablet (2.5 mg total) by mouth 2 (two) times daily.   BOOST/FIBER PO Take 1 Bottle by mouth 3 (three) times daily.   empagliflozin  10 MG Tabs tablet Commonly known as: Jardiance  Take 1 tablet (10 mg total) by mouth daily.   furosemide  20 MG tablet Commonly known as: LASIX  Take 10 mg by mouth daily as needed (for swelling of the ankles).   ipratropium 0.06 % nasal spray Commonly known as: ATROVENT  Place 2 sprays into both nostrils 2 (two) times daily as needed (allergies).   isosorbide  mononitrate 30 MG 24 hr tablet Commonly known as: IMDUR  Take 1 tablet (30 mg total) by mouth daily.   ketoconazole  2 % cream Commonly known as: NIZORAL  Apply 1 Application topically 2 (two) times daily as needed for irritation.   loratadine  10 MG tablet Commonly known as: CLARITIN  Take 10 mg by mouth daily as needed for allergies.   megestrol  40 MG tablet Commonly known as:  MEGACE  Take 1 tablet (40 mg total) by mouth in the morning.   metoprolol  succinate 25 MG 24 hr tablet Commonly known as: TOPROL -XL Take 1 tablet (25 mg total) by mouth daily. Take with or immediately following a meal.   mirtazapine  15 MG tablet Commonly known as: REMERON  Take 1 tablet (15 mg total) by mouth at bedtime.   nitroGLYCERIN  0.4 MG SL tablet Commonly known as: NITROSTAT  Place 1 tablet (0.4 mg total) under the tongue every 5 (five) minutes as needed for chest pain.   pantoprazole  40 MG tablet Commonly known as: PROTONIX  Take 1 tablet (40 mg total) by mouth daily. What changed:  when to take this reasons to take this  polyethylene glycol 17 g packet Commonly known as: MIRALAX  / GLYCOLAX  Take 17 g by mouth 2 (two) times daily.   senna-docusate 8.6-50 MG tablet Commonly known as: Senokot-S Take 1 tablet by mouth 2 (two) times daily.   triamcinolone  55 MCG/ACT nasal inhaler Commonly known as: NASACORT Place 1 spray into both nostrils daily as needed (for allergies).   Vitamin D3 50 MCG (2000 UT) Tabs Take 2,000 Units by mouth in the morning.       Allergies  Allergen Reactions   Lidocaine  Anaphylaxis   Morphine  Other (See Comments)    Body shuts down   Procaine Hcl Anaphylaxis, Rash and Other (See Comments)    Anything with 'caine' in it    Sulfonamide Derivatives Hives   Amlodipine  Swelling   Ezetimibe-Simvastatin Other (See Comments)    VYTORIN = Joint pain   Latex Itching and Other (See Comments)    WELTS   Norco [Hydrocodone -Acetaminophen ] Nausea And Vomiting and Other (See Comments)    States does ok with IV form    Tramadol  Nausea And Vomiting   Tape Itching and Other (See Comments)    Patient prefers either paper tape or Coban wrap    Contact information for follow-up providers     MD at SNF Follow up.          Carlie Clark, MD Follow up.   Specialty: Otolaryngology Why: Follow-up as scheduled. Contact information: [address] [place] [address] [phone]              Contact information for after-discharge care     Destination     [facility] .   Service: Skilled Nursing Contact information: [address] [place]  [address] [phone]                      The results of significant diagnostics from this hospitalization (including imaging, microbiology, ancillary and laboratory) are listed below for reference.    Significant Diagnostic Studies: CT CHEST WO CONTRAST Result Date: 05/22/2024 EXAM: CT CHEST WITHOUT CONTRAST 05/22/2024 01:42:57 AM TECHNIQUE: CT of the chest was performed without the administration of intravenous contrast. Multiplanar reformatted images are provided for review. Automated exposure control, iterative reconstruction, and/or weight based adjustment of the mA/kV was utilized to reduce the radiation dose to as low as reasonably achievable. COMPARISON: 03/24/2024 CLINICAL HISTORY: Dyspnea, hypercalcemia. FINDINGS: MEDIASTINUM: Heart and pericardium are unremarkable. The central airways are clear. Mild 3-vessel coronary atherosclerosis. Left subclavian pacemaker. LYMPH NODES: No mediastinal, hilar or axillary lymphadenopathy. LUNGS AND PLEURA: Biapical pleural parenchymal scarring. Small right pleural effusion. Mild linear bilateral lower lobe scarring/atelectasis. SOFT TISSUES/BONES: Right shoulder arthroplasty. Mild degenerative changes of the visualized thoracolumbar spine. UPPER ABDOMEN: Limited images of the upper abdomen demonstrates no acute abnormality. IMPRESSION: 1. No acute findings. 2. Small right pleural effusion. Electronically signed by: Pinkie Pebbles MD 05/22/2024 02:04 AM EDT RP Workstation: [id number]   VAS US  LOWER EXTREMITY VENOUS (DVT) Result Date: 05/19/2024  Lower Venous DVT Study Patient Name:  KATYANA TROLINGER [facility]  Date of Exam:   05/19/2024 Medical Rec #: [id number]                Accession #:    [id number] Date of Birth: 1941-03-21               Patient Gender: F Patient Age:   46 years Exam Location:  [facility] Procedure:  VAS US  LOWER EXTREMITY VENOUS (DVT) Referring Phys: TIFFANY Mitchell --------------------------------------------------------------------------------  Indications: Edema.  Limitations: Pt is unable to position leg that is adequate for optimal imaging. Comparison Study: No prior Performing Technologist: Elmarie Lindau, RVT  Examination Guidelines: A complete evaluation includes B-mode imaging, spectral Doppler, color Doppler, and power Doppler as needed of all accessible portions of each vessel. Bilateral testing is considered an integral part of a complete examination. Limited examinations for reoccurring indications may be performed as noted. The reflux portion of the exam is performed with the patient in reverse Trendelenburg.  +-----+---------------+---------+-----------+----------+---------------+ RIGHTCompressibilityPhasicitySpontaneityPropertiesThrombus Aging  +-----+---------------+---------+-----------+----------+---------------+ CFV  Full           Yes      Yes                  thickened walls +-----+---------------+---------+-----------+----------+---------------+   +---------+---------------+---------+-----------+----------+--------------+ LEFT     CompressibilityPhasicitySpontaneityPropertiesThrombus Aging +---------+---------------+---------+-----------+----------+--------------+ CFV      Full           Yes      Yes                                 +---------+---------------+---------+-----------+----------+--------------+ SFJ      Full                                                        +---------+---------------+---------+-----------+----------+--------------+ FV Prox  Full                                                         +---------+---------------+---------+-----------+----------+--------------+ FV Mid   Full                                                        +---------+---------------+---------+-----------+----------+--------------+ FV DistalFull                                                        +---------+---------------+---------+-----------+----------+--------------+ PFV      Full                                                        +---------+---------------+---------+-----------+----------+--------------+ POP      Full           Yes      Yes                                 +---------+---------------+---------+-----------+----------+--------------+ PTV      Full                                                        +---------+---------------+---------+-----------+----------+--------------+  PERO     Full                                                        +---------+---------------+---------+-----------+----------+--------------+     Summary: RIGHT: - No evidence of common femoral vein obstruction.  - No cystic structure found in the popliteal fossa.  LEFT: - There is no evidence of deep vein thrombosis in the lower extremity.  - No cystic structure found in the popliteal fossa.  *See table(s) above for measurements and observations. Electronically signed by Gaile New MD on 05/19/2024 at 5:12:14 PM.    Final    ECHOCARDIOGRAM COMPLETE Result Date: 05/18/2024    ECHOCARDIOGRAM REPORT   Patient Name:   LUTISHA KNOCHE [facility] Date of Exam: 05/18/2024 Medical Rec #:  [id number]              Height:       67.0 in Accession #:    [id number]             Weight:       125.0 lb Date of Birth:  1940-11-10              BSA:          1.656 m Patient Age:    83 years               BP:           125/47 mmHg Patient Gender: F                      HR:           62 bpm. Exam Location:  Inpatient Procedure: 2D Echo, Cardiac Doppler, Color Doppler and Intracardiac             Opacification Agent (Both Spectral and Color Flow Doppler were            utilized during procedure). Indications:    Elevated Troponin  History:        Patient has prior history of Echocardiogram examinations. Risk                 Factors:Hypertension.  Sonographer:    Vella Key Referring Phys: [id number] DAVID MANUEL ORTIZ IMPRESSIONS  1. There is systolic left ventricular mid-cavity obliteration with apical entrapment. There is no LV outflow obstruction. Left ventricular ejection fraction, by estimation, is >75%. The left ventricle has hyperdynamic function. The left ventricle has no  regional wall motion abnormalities. There is severe asymmetric left ventricular hypertrophy of the apical segment. Left ventricular diastolic parameters are consistent with Grade II diastolic dysfunction (pseudonormalization). Elevated left atrial pressure.  2. Right ventricular systolic function is normal. The right ventricular size is normal. There is normal pulmonary artery systolic pressure. The estimated right ventricular systolic pressure is 30.8 mmHg.  3. Left atrial size was severely dilated.  4. The mitral valve is normal in structure. Mild mitral valve regurgitation. No evidence of mitral stenosis.  5. The aortic valve is tricuspid. There is mild calcification of the aortic valve. There is mild thickening of the aortic valve. Aortic valve regurgitation is not visualized. Aortic valve sclerosis/calcification is present, without any evidence of aortic stenosis.  6. The inferior vena cava is dilated in size with >50% respiratory variability, suggesting right atrial pressure  of 8 mmHg. Comparison(s): No significant change from prior study. Prior images reviewed side by side. 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 FINDINGS  Left Ventricle: There is systolic left ventricular mid-cavity obliteration with apical entrapment. There is no LV outflow obstruction. Left  ventricular ejection fraction, by estimation, is >75%. The left ventricle has hyperdynamic function. The left ventricle has no regional wall motion abnormalities. The left ventricular internal cavity size was normal in size. There is severe asymmetric left ventricular hypertrophy of the apical segment. Left ventricular diastolic parameters are consistent with Grade II diastolic dysfunction (pseudonormalization). Elevated left atrial pressure. Right Ventricle: The right ventricular size is normal. No increase in right ventricular wall thickness. Right ventricular systolic function is normal. There is normal pulmonary artery systolic pressure. The tricuspid regurgitant velocity is 2.39 m/s, and  with an assumed right atrial pressure of 8 mmHg, the estimated right ventricular systolic pressure is 30.8 mmHg. Left Atrium: Left atrial size was severely dilated. Right Atrium: Right atrial size was normal in size. Pericardium: There is no evidence of pericardial effusion. Mitral Valve: The mitral valve is normal in structure. Mild mitral valve regurgitation, with centrally-directed jet. No evidence of mitral valve stenosis. Tricuspid Valve: The tricuspid valve is normal in structure. Tricuspid valve regurgitation is mild. Aortic Valve: The aortic valve is tricuspid. There is mild calcification of the aortic valve. There is mild thickening of the aortic valve. Aortic valve regurgitation is not visualized. Aortic valve sclerosis/calcification is present, without any evidence of aortic stenosis. Pulmonic Valve: The pulmonic valve was grossly normal. Pulmonic valve regurgitation is not visualized. No evidence of pulmonic stenosis. Aorta: The aortic root and ascending aorta are structurally normal, with no evidence of dilitation. Venous: IVC assessment for right atrial pressure unable to be performed due to mechanical ventilation. The inferior vena cava is dilated in size with greater than 50% respiratory variability, suggesting  right atrial pressure of 8 mmHg. IAS/Shunts: No atrial level shunt detected by color flow Doppler. Additional Comments: A device lead is visualized in the right ventricle.  LEFT VENTRICLE PLAX 2D LVIDd:         4.10 cm     Diastology LVIDs:         2.60 cm     LV e' medial:    5.55 cm/s LV PW:         1.10 cm     LV E/e' medial:  15.4 LV IVS:        1.10 cm     LV e' lateral:   6.96 cm/s                            LV E/e' lateral: 12.3  LV Volumes (MOD) LV vol d, MOD A2C: 76.7 ml LV vol d, MOD A4C: 87.0 ml LV vol s, MOD A2C: 12.2 ml LV vol s, MOD A4C: 22.9 ml LV SV MOD A2C:     64.5 ml LV SV MOD A4C:     87.0 ml LV SV MOD BP:      67.6 ml RIGHT VENTRICLE RV Basal diam:  3.60 cm RV S prime:     10.80 cm/s TAPSE (M-mode): 1.8 cm LEFT ATRIUM            Index        RIGHT ATRIUM           Index LA diam:      4.80 cm  2.90 cm/m   RA Area:     16.10 cm LA Vol (A2C): 131.0 ml 79.10 ml/m  RA Volume:   40.20 ml  24.27 ml/m LA Vol (A4C): 71.6 ml  43.23 ml/m  AORTIC VALVE LVOT Vmax:   134.00 cm/s LVOT Vmean:  87.700 cm/s LVOT VTI:    0.236 m  AORTA Ao Root diam: 2.50 cm Ao Asc diam:  2.50 cm MITRAL VALVE               TRICUSPID VALVE MV Area (PHT): 3.49 cm    TV Peak grad:   22.8 mmHg MV E velocity: 85.70 cm/s  TV Vmax:        2.39 m/s MV A velocity: 62.10 cm/s  TR Peak grad:   22.8 mmHg MV E/A ratio:  1.38        TR Vmax:        239.00 cm/s                             SHUNTS                            Systemic VTI: 0.24 m Jerel Croitoru MD Electronically signed by Jerel Balding MD Signature Date/Time: 05/18/2024/5:26:40 PM    Final    DG Chest Portable 1 View Result Date: 05/18/2024 CLINICAL DATA:  Dyspnea on exertion. EXAM: PORTABLE CHEST - 1 VIEW COMPARISON:  09/26/2023 FINDINGS: Cardiomediastinal silhouette and pulmonary vasculature are within normal limits. Lungs are clear. Dual lead LEFT chest wall pacemaker is unchanged in configuration. RIGHT reversed total shoulder prosthesis is partially visualized.  IMPRESSION: No acute cardiopulmonary process. Electronically Signed   By: Aliene Lloyd M.D.   On: 05/18/2024 12:43   CUP PACEART REMOTE DEVICE CHECK Result Date: 05/11/2024 PPM Scheduled remote reviewed. Normal device function.  Presenting rhythm:  AS-VS, PVCs.  2 AHR, longest 14 min 51 sec, EGMs consistent with AFL.  History of PAF, on Eliquis  per Epic.  2 VHR detections, EGMs consistent with SVT. Next remote 91 days. - CS, CVRS  CT HEAD WO CONTRAST Result Date: 05/08/2024 CLINICAL DATA:  Neuro deficit, concern for stroke, slurred speech, unintentional weight loss. EXAM: CT HEAD WITHOUT CONTRAST TECHNIQUE: Contiguous axial images were obtained from the base of the skull through the vertex without intravenous contrast. RADIATION DOSE REDUCTION: This exam was performed according to the departmental dose-optimization program which includes automated exposure control, adjustment of the mA and/or kV according to patient size and/or use of iterative reconstruction technique. COMPARISON:  None Available. FINDINGS: Brain: No acute intracranial hemorrhage. No CT evidence of acute infarct. No edema, mass effect, or midline shift. The basilar cisterns are patent. Ventricles: The ventricles are normal. Vascular: Atherosclerotic calcifications of the carotid siphons. No hyperdense vessel. Skull: No acute or aggressive finding. Orbits: Left lens replacement.  Orbits are otherwise unremarkable. Sinuses: Mild mucosal thickening in the ethmoid sinuses. Other: Trace fluid in the right mastoid tip. IMPRESSION: No CT evidence of acute intracranial abnormality. Electronically Signed   By: Donnice Mania M.D.   On: 05/08/2024 13:13    Microbiology: No results found for this or any previous visit (from the past 240 hours).   Labs: Basic Metabolic Panel: Recent Labs  Lab 05/18/24 1132 05/18/24 1427 05/19/24 0416 05/20/24 0423 05/21/24 0358 05/22/24 0437  NA 138  --  135 139 138 135  K 3.0*  --  3.5  3.7 3.9 3.7  CL 104   --  103 104 103 103  CO2 25  --  25 28 27 26   GLUCOSE 110*  --  107* 98 113* 97  BUN 28*  --  29* 30* 28* 24*  CREATININE 1.61*  --  1.82* 1.47* 1.44* 1.35*  CALCIUM  11.5*  --  10.8* 11.0* 10.6* 10.4*  MG 1.8  --   --  2.2  --   --   PHOS  --  2.0* 2.1* 4.6  --   --    Liver Function Tests: Recent Labs  Lab 05/18/24 1132 05/19/24 0416 05/20/24 0423  AST 63* 58*  --   ALT 25 24  --   ALKPHOS 67 74  --   BILITOT 0.7 0.7  --   PROT 7.2 6.5  --   ALBUMIN 2.6* 2.5* 2.5*   No results for input(s): LIPASE, AMYLASE in the last 168 hours. No results for input(s): AMMONIA in the last 168 hours. CBC: Recent Labs  Lab 05/18/24 1132 05/19/24 0416 05/20/24 0423 05/21/24 0358 05/22/24 0437  WBC 3.4* 2.8* 3.3* 4.3 4.4  HGB 11.1* 10.3* 10.4* 10.9* 10.2*  HCT 35.4* 32.9* 33.2* 34.7* 33.4*  MCV 89.6 90.6 88.8 90.1 90.3  PLT 119* 111* 115* 131* 141*   Cardiac Enzymes: No results for input(s): CKTOTAL, CKMB, CKMBINDEX, TROPONINI in the last 168 hours. BNP: BNP (last 3 results) Recent Labs    05/18/24 1132  BNP 852.3*    ProBNP (last 3 results) No results for input(s): PROBNP in the last 8760 hours.  CBG: Recent Labs  Lab 05/18/24 1114  GLUCAP 98       Signed:  Toribio Hummer MD.  [facility] 05/22/2024, 12:46 PM

## 2024-05-23 DIAGNOSIS — R627 Adult failure to thrive: Secondary | ICD-10-CM | POA: Diagnosis not present

## 2024-05-23 DIAGNOSIS — I480 Paroxysmal atrial fibrillation: Secondary | ICD-10-CM | POA: Diagnosis not present

## 2024-05-23 DIAGNOSIS — E7800 Pure hypercholesterolemia, unspecified: Secondary | ICD-10-CM | POA: Diagnosis not present

## 2024-05-23 MED ORDER — DICLOFENAC SODIUM 1 % EX GEL
2.0000 g | Freq: Four times a day (QID) | CUTANEOUS | Status: DC
  Administered 2024-05-23 – 2024-05-24 (×5): 2 g via TOPICAL
  Filled 2024-05-23: qty 100

## 2024-05-23 NOTE — TOC Progression Note (Signed)
 Transition of Care [facility]) - Progression Note    Patient Details  Name: Barbara [name] MRN: [id number] Date of Birth: Sep 11, 1941  Transition of Care [facility]) CM/SW Contact  Lorraine LILLETTE [place], [place] Phone Number: 05/23/2024, 10:17 AM  Clinical Narrative:     CSW called [facility] RN [place]- Transportation Auth still under review, sent to the medical director.  Facility can accept pt Monday- [facility] selected by member. TOC following.  Expected Discharge Plan: Skilled Nursing Facility Barriers to Discharge: Continued Medical Work up  Expected Discharge Plan and Services       Living arrangements for the past 2 months: Single Family Home Expected Discharge Date: 05/22/24                                     Social Determinants of Health (SDOH) Interventions SDOH Screenings   Food Insecurity: No Food Insecurity (05/18/2024)  Housing: Low Risk  (05/18/2024)  Transportation Needs: Unmet Transportation Needs (05/18/2024)  Utilities: Not At Risk (05/18/2024)  Alcohol Screen: Low Risk  (08/05/2022)  Depression (PHQ2-9): Low Risk  (04/05/2024)  Financial Resource Strain: Low Risk  (08/05/2022)  Physical Activity: Insufficiently Active (07/02/2022)  Social Connections: Moderately Integrated (05/18/2024)  Stress: No Stress Concern Present (07/02/2022)  Tobacco Use: Medium Risk (05/20/2024)    Readmission Risk Interventions    03/11/2024    1:07 PM  Readmission Risk Prevention Plan  Transportation Screening Complete  PCP or Specialist Appt within 5-7 Days Complete  Home Care Screening Complete  Medication Review (RN CM) Complete

## 2024-05-23 NOTE — Plan of Care (Signed)

## 2024-05-23 NOTE — Progress Notes (Signed)
 Mobility Specialist - Progress Note   05/23/24 1508  Mobility  Activity Transferred from bed to chair  Level of Assistance Minimal assist, patient does 75% or more  Assistive Device Front wheel walker  Range of Motion/Exercises Active  Activity Response Tolerated well  Mobility Referral Yes  Mobility visit 1 Mobility  Mobility Specialist Start Time (ACUTE ONLY) 1450  Mobility Specialist Stop Time (ACUTE ONLY) 1508  Mobility Specialist Time Calculation (min) (ACUTE ONLY) 18 min   Pt was found in bed and agreeable to transfer to recliner chair. Pt was left with all needs met. Call bell in reach and RN notified.  Erminio Leos,  Mobility Specialist Can be reached via Secure Chat

## 2024-05-23 NOTE — Progress Notes (Signed)
 PROGRESS NOTE    Barbara [name]  FMW:[id number] DOB: Oct 24, 1941 DOA: 05/18/2024 PCP: Johnny Garnette LABOR, MD    Chief Complaint  Patient presents with   Leg Swelling    Brief Narrative:  Patient 83 year old female history of bilateral saphenous ablation, CAD, non-STEMI, chronic diastolic CHF, heart murmur, status post PPM, dyspnea, hypertension, hyperlipidemia, colonic polyps, GERD, nephrolithiasis, OA, hypercalcemia presented to the ED from PCPs office due to exertional dyspnea, lower extremity edema, failure to thrive with poor oral intake and muscle pain.  Chest x-ray done with no significant abnormalities noted.  Patient noted to have elevated troponin and admitted due to concerns for acute on chronic diastolic CHF and placed on IV Lasix .  Cardiology consulted and following.   Assessment & Plan:   Principal Problem:   Failure to thrive in adult Active Problems:   Abnormal weight loss   CAD (coronary artery disease/elevated troponin   PAF (paroxysmal atrial fibrillation) (HCC)   Essential hypertension   CKD (chronic kidney disease), stage III (HCC)   Hyperlipidemia   Gastroesophageal reflux disease   OSA (obstructive sleep apnea)   Hypercalcemia   Loss of appetite   Elevated troponin   Acute on chronic diastolic congestive heart failure (HCC)   Hypophosphatemia   Hypokalemia   Moderate protein malnutrition (HCC)   Pancytopenia (HCC)  #1 failure to thrive/moderate protein calorie malnutrition -??  Etiology. - May be secondary to persistent hypercalcemia. - Continue Megace , mirtazapine . - PT/OT. - SLP consulted and following.  2.  Acute on chronic diastolic CHF exacerbation/elevated troponin - Patient presented with dyspnea on exertion, some lower extremity edema. - 2D echo obtained with systolic left ventricular mid cavity obliteration with apical entrapment, no LV outflow obstruction, EF > 75%.  Severe asymmetric LVH of the apical segment.  Grade 2 diastolic  dysfunction.  Severely dilated left atrial size.  Mild MVR. - Patient noted to have been placed on Lasix  20 mg IV daily early on in the hospitalization with a urine output of 700 cc over the past 24 hours. -Patient seen in consultation by cardiology IV Lasix  have been discontinued and recommending Lasix  10 to 20 mg daily as needed for edema or shortness of breath. -Continue Jardiance , metoprolol , Imdur . - Per cardiology elevated troponin likely secondary to demand ischemia and no ischemic evaluation indicated at this time. - Patient still with complaints of shortness of breath on exertion and such CT chest which was done with no acute findings, small right pleural effusion noted.   - Supportive care.   3.  Left ankle swelling -2D echo with systolic left ventricular mid cavity obliteration with apical entrapment, no LV outflow obstruction, EF > 75%.  Severe asymmetric LVH of the apical segment.  Grade 2 diastolic dysfunction.  Severely dilated left atrial size.  Mild MVR. -Lower extremity Dopplers done negative for DVT. -Compression stockings, keep lower extremities elevated.  4.  Hypertension  - Continue metoprolol , Imdur .    5.  Paroxysmal A-fib/PPM -Currently atrial paced. - Metoprolol  for rate control, Eliquis  for anticoagulation.    6.  Hypercalcemia - Noted to be chronic in nature. - Patient with negative workup for gammopathy. - Dr. Celinda discussed with Dr.Pasam who noted to have seen patient last month and recommended consideration for repeat chest CT to look for lymphadenopathy or sarcoidosis - Continue treatment of constipation. - Patient noted to have received lactulose . - Outpatient follow-up.  7.  Hypophosphatemia - Repleted.   8.  Hypokalemia - Repleted.   9.  Pancytopenia -Dr. Celinda, discussed with Dr.Pasam of hematology/oncology who felt may be due to age or early MDS. - May need to consider IR bone marrow biopsy. - Outpatient follow-up.  10.  CKD stage  IIIb -Patient with slight bump in creatinine, IV diuresis held. - Creatinine trended back down.  11.  Hyperlipidemia -Statin on hold due to body aches. - Continue to hold until outpatient follow-up with PCP.  12.  GERD - PPI.  13.  OSA - CPAP nightly.   14.  Hoarseness -Outpatient follow-up with ENT as previously scheduled.  15.  Right rib pain -Patient denies any recent falls. - Voltaren  gel 4 times daily. - IS/FV.    DVT prophylaxis: Eliquis  Code Status: Full Family Communication: Updated patient.  No family at bedside. Disposition: Patient medically stable as of 05/22/2024.  SNF when bed available.  Status is: Observation The patient remains OBS appropriate and will d/c before 2 midnights.   Consultants:  Cardiology: Dr. Raford 05/18/2024  Procedures:  2D echo 05/18/2024 Chest x-ray 05/18/2024 Lower extremity Doppler 05/19/2024  Antimicrobials:  Anti-infectives (From admission, onward)    None         Subjective: Patient with complaints of right sided rib pain.  Still with complaints of shortness of breath which is unchanged.  No chest pain.  No abdominal pain.  Tolerating current diet.   Objective: Vitals:   05/22/24 2244 05/23/24 0527 05/23/24 0732 05/23/24 0945  BP:  (!) 152/58  (!) 156/69  Pulse:  63  63  Resp: (!) 28 14    Temp:  99 F (37.2 C)    TempSrc:  Oral    SpO2:  96%    Weight:   56 kg   Height:        Intake/Output Summary (Last 24 hours) at 05/23/2024 1151 Last data filed at 05/23/2024 0532 Gross per 24 hour  Intake 120 ml  Output 700 ml  Net -580 ml   Filed Weights   05/21/24 0500 05/22/24 0500 05/23/24 0732  Weight: 58.2 kg 59.1 kg 56 kg    Examination:  General exam: NAD.  Right lower chest wall/ribs tender to palpation. Respiratory system: CTAB.  No wheezes, no crackles, no rhonchi.  Fair air movement.  Speaking in full sentences.  Cardiovascular system: RRR. 3/6 SEM.No JVD, murmurs, rubs, gallops or clicks. No pedal  edema. Gastrointestinal system: Abdomen is soft, nontender, nondistended, positive bowel sounds.  No rebound.  No guarding.  Central nervous system: Alert and oriented. No focal neurological deficits. Extremities: Symmetric 5 x 5 power. Skin: No rashes, lesions or ulcers Psychiatry: Judgement and insight appear normal. Mood & affect appropriate.     Data Reviewed: I have personally reviewed following labs and imaging studies  CBC: Recent Labs  Lab 05/18/24 1132 05/19/24 0416 05/20/24 0423 05/21/24 0358 05/22/24 0437  WBC 3.4* 2.8* 3.3* 4.3 4.4  HGB 11.1* 10.3* 10.4* 10.9* 10.2*  HCT 35.4* 32.9* 33.2* 34.7* 33.4*  MCV 89.6 90.6 88.8 90.1 90.3  PLT 119* 111* 115* 131* 141*    Basic Metabolic Panel: Recent Labs  Lab 05/18/24 1132 05/18/24 1427 05/19/24 0416 05/20/24 0423 05/21/24 0358 05/22/24 0437  NA 138  --  135 139 138 135  K 3.0*  --  3.5 3.7 3.9 3.7  CL 104  --  103 104 103 103  CO2 25  --  25 28 27 26   GLUCOSE 110*  --  107* 98 113* 97  BUN 28*  --  29* 30*  28* 24*  CREATININE 1.61*  --  1.82* 1.47* 1.44* 1.35*  CALCIUM  11.5*  --  10.8* 11.0* 10.6* 10.4*  MG 1.8  --   --  2.2  --   --   PHOS  --  2.0* 2.1* 4.6  --   --     GFR: Estimated Creatinine Clearance: 26.9 mL/min (A) (by C-G formula based on SCr of 1.35 mg/dL (H)).  Liver Function Tests: Recent Labs  Lab 05/18/24 1132 05/19/24 0416 05/20/24 0423  AST 63* 58*  --   ALT 25 24  --   ALKPHOS 67 74  --   BILITOT 0.7 0.7  --   PROT 7.2 6.5  --   ALBUMIN 2.6* 2.5* 2.5*    CBG: Recent Labs  Lab 05/18/24 1114  GLUCAP 98     No results found for this or any previous visit (from the past 240 hours).       Radiology Studies: CT CHEST WO CONTRAST Result Date: 05/22/2024 EXAM: CT CHEST WITHOUT CONTRAST 05/22/2024 01:42:57 AM TECHNIQUE: CT of the chest was performed without the administration of intravenous contrast. Multiplanar reformatted images are provided for review. Automated exposure  control, iterative reconstruction, and/or weight based adjustment of the mA/kV was utilized to reduce the radiation dose to as low as reasonably achievable. COMPARISON: 03/24/2024 CLINICAL HISTORY: Dyspnea, hypercalcemia. FINDINGS: MEDIASTINUM: Heart and pericardium are unremarkable. The central airways are clear. Mild 3-vessel coronary atherosclerosis. Left subclavian pacemaker. LYMPH NODES: No mediastinal, hilar or axillary lymphadenopathy. LUNGS AND PLEURA: Biapical pleural parenchymal scarring. Small right pleural effusion. Mild linear bilateral lower lobe scarring/atelectasis. SOFT TISSUES/BONES: Right shoulder arthroplasty. Mild degenerative changes of the visualized thoracolumbar spine. UPPER ABDOMEN: Limited images of the upper abdomen demonstrates no acute abnormality. IMPRESSION: 1. No acute findings. 2. Small right pleural effusion. Electronically signed by: Pinkie Pebbles MD 05/22/2024 02:04 AM EDT RP Workstation: [id number]         Scheduled Meds:  apixaban   2.5 mg Oral BID   empagliflozin   10 mg Oral Daily   isosorbide  mononitrate  30 mg Oral Daily   megestrol   40 mg Oral Daily   metoprolol  succinate  25 mg Oral Daily   mirtazapine   15 mg Oral QHS   pantoprazole   40 mg Oral Daily   polyethylene glycol  17 g Oral BID   senna-docusate  1 tablet Oral BID   Continuous Infusions:     LOS: 1 day    Time spent: 40 minutes    Toribio Hummer, MD [facility]   To contact the attending provider between 7A-7P or the covering provider during after hours 7P-7A, please log into the web site [email or web address] and access using universal [place] password for that web site. If you do not have the password, please call the hospital operator.  05/23/2024, 11:51 AM

## 2024-05-23 NOTE — TOC Progression Note (Signed)
 Transition of Care [facility]) - Progression Note    Patient Details  Name: Barbara [name] MRN: [id number] Date of Birth: 08/24/1941  Transition of Care [facility]) CM/SW Contact  Sonda Manuella Quill, RN Phone Number: 05/23/2024, 9:36 AM  Clinical Narrative:    Received VM from Claris, RN at [facility]; she says ins auth received for [facility], auth # [id number]; she said pt has 5 days to transfer to facility; her callback # is [phone].   Expected Discharge Plan: Skilled Nursing Facility Barriers to Discharge: Continued Medical Work up  Expected Discharge Plan and Services       Living arrangements for the past 2 months: Single Family Home Expected Discharge Date: 05/22/24                                     Social Determinants of Health (SDOH) Interventions SDOH Screenings   Food Insecurity: No Food Insecurity (05/18/2024)  Housing: Low Risk  (05/18/2024)  Transportation Needs: Unmet Transportation Needs (05/18/2024)  Utilities: Not At Risk (05/18/2024)  Alcohol Screen: Low Risk  (08/05/2022)  Depression (PHQ2-9): Low Risk  (04/05/2024)  Financial Resource Strain: Low Risk  (08/05/2022)  Physical Activity: Insufficiently Active (07/02/2022)  Social Connections: Moderately Integrated (05/18/2024)  Stress: No Stress Concern Present (07/02/2022)  Tobacco Use: Medium Risk (05/20/2024)    Readmission Risk Interventions    03/11/2024    1:07 PM  Readmission Risk Prevention Plan  Transportation Screening Complete  PCP or Specialist Appt within 5-7 Days Complete  Home Care Screening Complete  Medication Review (RN CM) Complete

## 2024-05-23 NOTE — Progress Notes (Signed)
   05/23/24 2305  BiPAP/CPAP/SIPAP  Reason BIPAP/CPAP not in use Other(comment) (PATIENT STATED SHE DOES NOT WANT TO WEAR RIGHT NOW.)  BiPAP/CPAP /SiPAP Vitals  Resp (!) 26  MEWS Score/Color  MEWS Score 2  MEWS Score Color Yellow

## 2024-05-24 DIAGNOSIS — I2510 Atherosclerotic heart disease of native coronary artery without angina pectoris: Secondary | ICD-10-CM | POA: Diagnosis not present

## 2024-05-24 DIAGNOSIS — I5030 Unspecified diastolic (congestive) heart failure: Secondary | ICD-10-CM | POA: Diagnosis not present

## 2024-05-24 DIAGNOSIS — R0600 Dyspnea, unspecified: Secondary | ICD-10-CM | POA: Diagnosis not present

## 2024-05-24 DIAGNOSIS — E440 Moderate protein-calorie malnutrition: Secondary | ICD-10-CM | POA: Diagnosis not present

## 2024-05-24 DIAGNOSIS — R531 Weakness: Secondary | ICD-10-CM | POA: Diagnosis not present

## 2024-05-24 DIAGNOSIS — I4891 Unspecified atrial fibrillation: Secondary | ICD-10-CM | POA: Diagnosis not present

## 2024-05-24 DIAGNOSIS — R41841 Cognitive communication deficit: Secondary | ICD-10-CM | POA: Diagnosis not present

## 2024-05-24 DIAGNOSIS — G4733 Obstructive sleep apnea (adult) (pediatric): Secondary | ICD-10-CM | POA: Diagnosis not present

## 2024-05-24 DIAGNOSIS — I5189 Other ill-defined heart diseases: Secondary | ICD-10-CM | POA: Diagnosis not present

## 2024-05-24 DIAGNOSIS — I5032 Chronic diastolic (congestive) heart failure: Secondary | ICD-10-CM | POA: Diagnosis not present

## 2024-05-24 DIAGNOSIS — I509 Heart failure, unspecified: Secondary | ICD-10-CM | POA: Diagnosis not present

## 2024-05-24 DIAGNOSIS — E7800 Pure hypercholesterolemia, unspecified: Secondary | ICD-10-CM | POA: Diagnosis not present

## 2024-05-24 DIAGNOSIS — I517 Cardiomegaly: Secondary | ICD-10-CM | POA: Diagnosis not present

## 2024-05-24 DIAGNOSIS — M6281 Muscle weakness (generalized): Secondary | ICD-10-CM | POA: Diagnosis not present

## 2024-05-24 DIAGNOSIS — N1832 Chronic kidney disease, stage 3b: Secondary | ICD-10-CM | POA: Diagnosis not present

## 2024-05-24 DIAGNOSIS — I5033 Acute on chronic diastolic (congestive) heart failure: Secondary | ICD-10-CM | POA: Diagnosis not present

## 2024-05-24 DIAGNOSIS — R2689 Other abnormalities of gait and mobility: Secondary | ICD-10-CM | POA: Diagnosis not present

## 2024-05-24 DIAGNOSIS — Z5189 Encounter for other specified aftercare: Secondary | ICD-10-CM | POA: Diagnosis not present

## 2024-05-24 DIAGNOSIS — I110 Hypertensive heart disease with heart failure: Secondary | ICD-10-CM | POA: Diagnosis not present

## 2024-05-24 DIAGNOSIS — Z09 Encounter for follow-up examination after completed treatment for conditions other than malignant neoplasm: Secondary | ICD-10-CM | POA: Diagnosis not present

## 2024-05-24 DIAGNOSIS — R490 Dysphonia: Secondary | ICD-10-CM | POA: Diagnosis not present

## 2024-05-24 DIAGNOSIS — R627 Adult failure to thrive: Secondary | ICD-10-CM | POA: Diagnosis not present

## 2024-05-24 DIAGNOSIS — I130 Hypertensive heart and chronic kidney disease with heart failure and stage 1 through stage 4 chronic kidney disease, or unspecified chronic kidney disease: Secondary | ICD-10-CM | POA: Diagnosis not present

## 2024-05-24 DIAGNOSIS — R609 Edema, unspecified: Secondary | ICD-10-CM | POA: Diagnosis not present

## 2024-05-24 DIAGNOSIS — I340 Nonrheumatic mitral (valve) insufficiency: Secondary | ICD-10-CM | POA: Diagnosis not present

## 2024-05-24 DIAGNOSIS — D61818 Other pancytopenia: Secondary | ICD-10-CM | POA: Diagnosis not present

## 2024-05-24 DIAGNOSIS — N183 Chronic kidney disease, stage 3 unspecified: Secondary | ICD-10-CM | POA: Diagnosis not present

## 2024-05-24 DIAGNOSIS — Z7401 Bed confinement status: Secondary | ICD-10-CM | POA: Diagnosis not present

## 2024-05-24 DIAGNOSIS — Z950 Presence of cardiac pacemaker: Secondary | ICD-10-CM | POA: Diagnosis not present

## 2024-05-24 DIAGNOSIS — I480 Paroxysmal atrial fibrillation: Secondary | ICD-10-CM | POA: Diagnosis not present

## 2024-05-24 DIAGNOSIS — K219 Gastro-esophageal reflux disease without esophagitis: Secondary | ICD-10-CM | POA: Diagnosis not present

## 2024-05-24 DIAGNOSIS — R7989 Other specified abnormal findings of blood chemistry: Secondary | ICD-10-CM | POA: Diagnosis not present

## 2024-05-24 DIAGNOSIS — E119 Type 2 diabetes mellitus without complications: Secondary | ICD-10-CM | POA: Diagnosis not present

## 2024-05-24 DIAGNOSIS — I10 Essential (primary) hypertension: Secondary | ICD-10-CM | POA: Diagnosis not present

## 2024-05-24 LAB — BASIC METABOLIC PANEL WITH GFR
Anion gap: 9 (ref 5–15)
BUN: 27 mg/dL — ABNORMAL HIGH (ref 8–23)
CO2: 22 mmol/L (ref 22–32)
Calcium: 11.5 mg/dL — ABNORMAL HIGH (ref 8.9–10.3)
Chloride: 106 mmol/L (ref 98–111)
Creatinine, Ser: 1.39 mg/dL — ABNORMAL HIGH (ref 0.44–1.00)
GFR, Estimated: 37 mL/min — ABNORMAL LOW (ref >60)
Glucose, Bld: 107 mg/dL — ABNORMAL HIGH (ref 70–99)
Potassium: 4 mmol/L (ref 3.5–5.1)
Sodium: 137 mmol/L (ref 135–145)

## 2024-05-24 MED ORDER — DICLOFENAC SODIUM 1 % EX GEL: CUTANEOUS | Status: AC

## 2024-05-24 NOTE — Progress Notes (Signed)
 Report called to [facility], given to Rehab RN, at [phone]. IV removed, belongings packed, family/patient aware pending EMS pick up.

## 2024-05-24 NOTE — TOC Transition Note (Incomplete Revision)
 Transition of Care [facility]) - Discharge Note   Patient Details  Name: Barbara [name] MRN: [id number] Date of Birth: 12/09/1940  Transition of Care [facility]) CM/SW Contact:  Tawni CHRISTELLA Eva, LCSW Phone Number: 05/24/2024, 11:04 AM   Clinical Narrative:     Pt to be discharged to [facility]. RN to call report to [phone]. Pt's room is 10004B. Pt was provided PTAR denial paperwork. Pt reports she will have her sister provide transportation to the facility. Care Management sign-off.   ADDEN  1:00pm CSW spoke with pt's son to discuss his concerns with pt's facility.    Barriers to Discharge: Continued Medical Work up   Patient Goals and CMS Choice Patient states their goals for this hospitalization and ongoing recovery are:: SNf ot get stonger CMS Medicare.gov Compare Post Acute Care list provided to:: Patient Choice offered to / list presented to : Patient      Discharge Placement                       Discharge Plan and Services Additional resources added to the After Visit Summary for                                       Social Drivers of Health (SDOH) Interventions SDOH Screenings   Food Insecurity: No Food Insecurity (05/18/2024)  Housing: Low Risk  (05/18/2024)  Transportation Needs: Unmet Transportation Needs (05/18/2024)  Utilities: Not At Risk (05/18/2024)  Alcohol Screen: Low Risk  (08/05/2022)  Depression (PHQ2-9): Low Risk  (04/05/2024)  Financial Resource Strain: Low Risk  (08/05/2022)  Physical Activity: Insufficiently Active (07/02/2022)  Social Connections: Moderately Integrated (05/18/2024)  Stress: No Stress Concern Present (07/02/2022)  Tobacco Use: Medium Risk (05/20/2024)     Readmission Risk Interventions    05/24/2024    8:57 AM 03/11/2024    1:07 PM  Readmission Risk Prevention Plan  Transportation Screening Complete Complete  PCP or Specialist Appt within 5-7 Days  Complete  Home Care Screening  Complete  Medication  Review (RN CM)  Complete  HRI or Home Care Consult Complete   Social Work Consult for Recovery Care Planning/Counseling Complete   Palliative Care Screening Not Applicable   Medication Review Oceanographer) Complete

## 2024-05-24 NOTE — TOC Transition Note (Signed)
 Transition of Care [facility]) - Discharge Note   Patient Details  Name: Barbara [name] MRN: [id number] Date of Birth: [birth year]  Transition of Care [facility]) CM/SW Contact:  Tawni CHRISTELLA Eva, LCSW Phone Number: 05/24/2024, 11:04 AM   Clinical Narrative:     Pt to be discharged to [facility]. RN to call report to [phone]. Pt's room is 10004B. Pt was provided PTAR denial paperwork. Pt reports she will have her sister provide transportation to the facility. Care Management sign-off.   ADDEN  1:00pm CSW spoke with the pt's son to discuss his concerns about the facility the pt had chosen. The pt's son initially wanted to change the facility; however, he ultimately decided to proceed with [facility]. CSW also spoke with the pt, who remains agreeable to discharge to [facility].  The pt has requested EMS transport. The pt was informed that insurance will not cover the cost of EMS transport. The pt verbalized understanding and insisted on proceeding with EMS transport. PTAR called. Care Management to sign off.   Barriers to Discharge: Continued Medical Work up   Patient Goals and CMS Choice Patient states their goals for this hospitalization and ongoing recovery are:: SNf ot get stonger CMS Medicare.gov Compare Post Acute Care list provided to:: Patient Choice offered to / list presented to : Patient      Discharge Placement                       Discharge Plan and Services Additional resources added to the After Visit Summary for                                       Social Drivers of Health (SDOH) Interventions SDOH Screenings   Food Insecurity: No Food Insecurity (05/18/2024)  Housing: Low Risk  (05/18/2024)  Transportation Needs: Unmet Transportation Needs (05/18/2024)  Utilities: Not At Risk (05/18/2024)  Alcohol Screen: Low Risk  (08/05/2022)  Depression (PHQ2-9): Low Risk  (04/05/2024)  Financial Resource Strain: Low Risk  (08/05/2022)   Physical Activity: Insufficiently Active (07/02/2022)  Social Connections: Moderately Integrated (05/18/2024)  Stress: No Stress Concern Present (07/02/2022)  Tobacco Use: Medium Risk (05/20/2024)     Readmission Risk Interventions    05/24/2024    8:57 AM 03/11/2024    1:07 PM  Readmission Risk Prevention Plan  Transportation Screening Complete Complete  PCP or Specialist Appt within 5-7 Days  Complete  Home Care Screening  Complete  Medication Review (RN CM)  Complete  HRI or Home Care Consult Complete   Social Work Consult for Recovery Care Planning/Counseling Complete   Palliative Care Screening Not Applicable   Medication Review Oceanographer) Complete

## 2024-05-24 NOTE — Discharge Summary (Signed)
 Physician Discharge Summary  Barbara [name] FMW:[id number] DOB: 1940-11-28 DOA: 05/18/2024  PCP: Johnny Garnette LABOR, MD  Admit date: 05/18/2024 Discharge date: 05/24/2024  Time spent: 60 minutes  Recommendations for Outpatient Follow-up:  Follow-up with MD at skilled nursing facility.  Patient will need a basic metabolic profile, magnesium  level, phosphorus level checked in 1 week to follow-up on electrolytes and renal function. Follow-up with Dr. Vaughan Ricker, ENT as scheduled.   Discharge Diagnoses:  Principal Problem:   Failure to thrive in adult Active Problems:   Abnormal weight loss   CAD (coronary artery disease/elevated troponin   PAF (paroxysmal atrial fibrillation) (HCC)   Essential hypertension   CKD (chronic kidney disease), stage III (HCC)   Hyperlipidemia   Gastroesophageal reflux disease   OSA (obstructive sleep apnea)   Hypercalcemia   Loss of appetite   Elevated troponin   Acute on chronic diastolic congestive heart failure (HCC)   Hypophosphatemia   Hypokalemia   Moderate protein malnutrition (HCC)   Pancytopenia (HCC)   Discharge Condition: Stable and improved.  Diet recommendation: Heart healthy  Filed Weights   05/22/24 0500 05/23/24 0732 05/24/24 0500  Weight: 59.1 kg 56 kg 56.1 kg    History of present illness:  HPI per Dr. Celinda Charolette Barbara [name] is a 83 y.o. female with medical history significant of bilateral saphenous ablation, CAD, NSTEMI, chronic diastolic CHF heart murmur, pacemaker placement, dyspnea, hypertension, hyperlipidemia, colon polyps, GERD, nephrolithiasis, osteoarthritis, history of pneumonia, sleep apnea, hypercalcemia who was sent to the emergency department by recommendation of her primary care provider due to exertional dyspnea, lower extremity edema, failure to thrive with poor appetite and muscle pains.  The patient has been hypercalcemic for several months, but PTH and PTH like peptide were normal.  She also has CT  chest and CT abdomen/pelvis with no acute abnormalities.  She was seen by hematology last month and there was no monoclonal gammopathy.  She denied fever, chills, rhinorrhea, sore throat, wheezing or hemoptysis. No chest pain, palpitations, diaphoresis, PND, orthopnea, but has had progressively worse pitting edema of the lower extremities in the last few weeks.  Her appetite is decreased and she has been constipated for over a week.  She gets occasional dyspnea, which she attributes to a long history of vocal cord dysfunction, but could not specify when did this happen.  No abdominal pain, nausea, emesis, diarrhea, melena or hematochezia.  No flank pain, dysuria, frequency or hematuria.  No polyuria, polydipsia, polyphagia or blurred vision.    Lab work: CBC showed white count of 3.4, hemoglobin 11.1 g/dL platelets 880.  PT 16.3, INR 1.2.  Troponin level is 238 ng/L with second set pending.  CMP shows sodium of 138, potassium 3.0, chloride 104 and CO2 25 mmol/L.  Glucose on 910, BUN 28, creatinine 1.61 and corrected calcium  12.6 mg/dL.  Total protein 7.2 and albumin 2.6 g/dL.  AST was 63 units/L, the rest of the LFTs were normal.   Imaging: Portable 1 view chest radiograph with normal cardiomediastinal silhouette and pulmonary vascular status within normal limits.  No acute cardiopulmonary process.   ED course: Initial vital signs were temperature 98.7 F, pulse 86, respiration 18, BP 131/60 mmHg O2 sat 98% on room air.  The patient received aspirin  324 mg, LR 1000 mm bolus, magnesium  oxide 100 mg p.o. and KCl 40 mill equivalents p.o. x 1.  She received aspirin  324 mg p.o., LR 1000 mL bolus, mag oxide 100 mg p.o. x 1 and KCl  40 mill equivalents p.o. x 1.  Hospital Course:  #1 failure to thrive/moderate protein calorie malnutrition -??  Etiology. - May be secondary to persistent hypercalcemia. - Patient maintained on home regimen Megace , mirtazapine . - PT/OT. - SLP consulted and following.   2.  Acute  on chronic diastolic CHF exacerbation/elevated troponin - Patient presented with dyspnea on exertion, some lower extremity edema. - 2D echo obtained with systolic left ventricular mid cavity obliteration with apical entrapment, no LV outflow obstruction, EF > 75%.  Severe asymmetric LVH of the apical segment.  Grade 2 diastolic dysfunction.  Severely dilated left atrial size.  Mild MVR. - Patient noted to have been placed on Lasix  20 mg IV daily early on in the hospitalization. -Patient seen in consultation by cardiology IV Lasix  were subsequently discontinued and recommended Lasix  10 to 20 mg daily as needed for edema or shortness of breath. - Patient maintained on home regimen Jardiance , metoprolol , Imdur . - Per cardiology elevated troponin likely secondary to demand ischemia and no ischemic evaluation indicated at this time. - Patient still with complaints of shortness of breath on exertion and such CT chest without contrast was obtained which was unremarkable.   - Outpatient follow-up.     3.  Left ankle swelling -2D echo with systolic left ventricular mid cavity obliteration with apical entrapment, no LV outflow obstruction, EF > 75%.  Severe asymmetric LVH of the apical segment.  Grade 2 diastolic dysfunction.  Severely dilated left atrial size.  Mild MVR. -Lower extremity Dopplers done negative for DVT. -Compression stockings, keep lower extremities elevated.   4.  Hypertension  - Patient maintained on home regimen Imdur , metoprolol .     5.  Paroxysmal A-fib/PPM - Patient remained atrial paced during the hospitalization.   - Patient maintained on home regimen of metoprolol  for rate control, Eliquis  for anticoagulation.    6.  Hypercalcemia - Noted to be chronic in nature. - Patient with negative workup for gammopathy. - Dr. Celinda discussed with Dr.Pasam who noted to have seen patient last month and recommended consideration for repeat chest CT to look for lymphadenopathy or  sarcoidosis -Repeat CT chest done without contrast showed no acute findings, small right pleural effusion noted. -Patient placed on bowel regimen for constipation. -Outpatient follow-up with PCP.   7.  Hypophosphatemia - Repleted.    8.  Hypokalemia - Repleted during the hospitalization.    9.  Pancytopenia -Dr. Celinda, discussed with Dr.Pasam of hematology/oncology who felt may be due to age or early MDS. - May need to consider IR bone marrow biopsy. - Outpatient follow-up.   10.  CKD stage IIIb -Patient with slight bump in creatinine, IV diuresis held. - Creatinine trended back down.   11.  Hyperlipidemia -Statin on hold due to body aches. - Continue to hold until outpatient follow-up with PCP.   12.  GERD - Patient maintained on PPI.     13.  OSA -Patient maintained on CPAP nightly.   14.  Hoarseness -Outpatient follow-up with ENT as previously scheduled.      Procedures: 2D echo 05/18/2024 Chest x-ray 05/18/2024 Lower extremity Doppler 05/19/2024 CT chest without contrast 05/21/2024  Consultations: Cardiology: Dr. Raford 05/18/2024    Discharge Exam: Vitals:   05/24/24 0000 05/24/24 0503  BP:  135/65  Pulse:  62  Resp: 20 16  Temp:  98.3 F (36.8 C)  SpO2:  100%    General: NAD Cardiovascular: RRR no murmurs rubs or gallops.  No JVD.  No pitting lower  extremity edema. Respiratory: Clear to auscultation bilaterally.  No wheezes, no crackles, no rhonchi.  Fair air movement.  Speaking in full sentences.  Discharge Instructions   Discharge Instructions     Diet - low sodium heart healthy   Complete by: As directed    Increase activity slowly   Complete by: As directed       Allergies as of 05/24/2024       Reactions   Lidocaine  Anaphylaxis   Morphine  Other (See Comments)   Body shuts down   Procaine Hcl Anaphylaxis, Rash, Other (See Comments)   Anything with 'caine' in it    Sulfonamide Derivatives Hives   Amlodipine  Swelling    Ezetimibe-simvastatin Other (See Comments)   VYTORIN = Joint pain   Latex Itching, Other (See Comments)   WELTS   Norco [hydrocodone -acetaminophen ] Nausea And Vomiting, Other (See Comments)   States does ok with IV form    Tramadol  Nausea And Vomiting   Tape Itching, Other (See Comments)   Patient prefers either paper tape or Coban wrap        Medication List     STOP taking these medications    hydrALAZINE  100 MG tablet Commonly known as: APRESOLINE    LORazepam  0.5 MG tablet Commonly known as: ATIVAN    rosuvastatin  40 MG tablet Commonly known as: CRESTOR        TAKE these medications    acetaminophen  325 MG tablet Commonly known as: TYLENOL  Take 2 tablets (650 mg total) by mouth every 6 (six) hours as needed for mild pain (pain score 1-3) or fever (or Fever >/= 101).   alum & mag hydroxide-simeth 200-200-20 MG/5ML suspension Commonly known as: MAALOX/MYLANTA Take 30 mLs by mouth every 4 (four) hours as needed for indigestion.   apixaban  2.5 MG Tabs tablet Commonly known as: Eliquis  Take 1 tablet (2.5 mg total) by mouth 2 (two) times daily.   BOOST/FIBER PO Take 1 Bottle by mouth 3 (three) times daily.   diclofenac  Sodium 1 % Gel Commonly known as: VOLTAREN  Apply 2 g topically 4 (four) times daily for 7 days, THEN 2 g 4 (four) times daily as needed. Apply to right lower ribs. Start taking on: May 24, 2024   empagliflozin  10 MG Tabs tablet Commonly known as: Jardiance  Take 1 tablet (10 mg total) by mouth daily.   furosemide  20 MG tablet Commonly known as: LASIX  Take 10 mg by mouth daily as needed (for swelling of the ankles).   ipratropium 0.06 % nasal spray Commonly known as: ATROVENT  Place 2 sprays into both nostrils 2 (two) times daily as needed (allergies).   isosorbide  mononitrate 30 MG 24 hr tablet Commonly known as: IMDUR  Take 1 tablet (30 mg total) by mouth daily.   ketoconazole  2 % cream Commonly known as: NIZORAL  Apply 1 Application  topically 2 (two) times daily as needed for irritation.   loratadine  10 MG tablet Commonly known as: CLARITIN  Take 10 mg by mouth daily as needed for allergies.   megestrol  40 MG tablet Commonly known as: MEGACE  Take 1 tablet (40 mg total) by mouth in the morning.   metoprolol  succinate 25 MG 24 hr tablet Commonly known as: TOPROL -XL Take 1 tablet (25 mg total) by mouth daily. Take with or immediately following a meal.   mirtazapine  15 MG tablet Commonly known as: REMERON  Take 1 tablet (15 mg total) by mouth at bedtime.   nitroGLYCERIN  0.4 MG SL tablet Commonly known as: NITROSTAT  Place 1 tablet (0.4 mg total) under the  tongue every 5 (five) minutes as needed for chest pain.   pantoprazole  40 MG tablet Commonly known as: PROTONIX  Take 1 tablet (40 mg total) by mouth daily. What changed:  when to take this reasons to take this   polyethylene glycol 17 g packet Commonly known as: MIRALAX  / GLYCOLAX  Take 17 g by mouth 2 (two) times daily.   senna-docusate 8.6-50 MG tablet Commonly known as: Senokot-S Take 1 tablet by mouth 2 (two) times daily.   triamcinolone  55 MCG/ACT nasal inhaler Commonly known as: NASACORT Place 1 spray into both nostrils daily as needed (for allergies).   Vitamin D3 50 MCG (2000 UT) Tabs Take 2,000 Units by mouth in the morning.        Allergies  Allergen Reactions   Lidocaine  Anaphylaxis   Morphine  Other (See Comments)    Body shuts down   Procaine Hcl Anaphylaxis, Rash and Other (See Comments)    Anything with 'caine' in it    Sulfonamide Derivatives Hives   Amlodipine  Swelling   Ezetimibe-Simvastatin Other (See Comments)    VYTORIN = Joint pain   Latex Itching and Other (See Comments)    WELTS   Norco [Hydrocodone -Acetaminophen ] Nausea And Vomiting and Other (See Comments)    States does ok with IV form    Tramadol  Nausea And Vomiting   Tape Itching and Other (See Comments)    Patient prefers either paper tape or Coban wrap     Contact information for follow-up providers     MD at SNF Follow up.          Carlie Clark, MD Follow up.   Specialty: Otolaryngology Why: Follow-up as scheduled. Contact information: [address] [place] [address] [phone]              Contact information for after-discharge care     Destination     [facility] .   Service: Skilled Nursing Contact information: [address] [place]  [address] [phone]                      The results of significant diagnostics from this hospitalization (including imaging, microbiology, ancillary and laboratory) are listed below for reference.    Significant Diagnostic Studies: CT CHEST WO CONTRAST Result Date: 05/22/2024 EXAM: CT CHEST WITHOUT CONTRAST 05/22/2024 01:42:57 AM TECHNIQUE: CT of the chest was performed without the administration of intravenous contrast. Multiplanar reformatted images are provided for review. Automated exposure control, iterative reconstruction, and/or weight based adjustment of the mA/kV was utilized to reduce the radiation dose to as low as reasonably achievable. COMPARISON: 03/24/2024 CLINICAL HISTORY: Dyspnea, hypercalcemia. FINDINGS: MEDIASTINUM: Heart and pericardium are unremarkable. The central airways are clear. Mild 3-vessel coronary atherosclerosis. Left subclavian pacemaker. LYMPH NODES: No mediastinal, hilar or axillary lymphadenopathy. LUNGS AND PLEURA: Biapical pleural parenchymal scarring. Small right pleural effusion. Mild linear bilateral lower lobe scarring/atelectasis. SOFT TISSUES/BONES: Right shoulder arthroplasty. Mild degenerative changes of the visualized thoracolumbar spine. UPPER ABDOMEN: Limited images of the upper abdomen demonstrates no acute abnormality. IMPRESSION: 1. No acute findings. 2. Small right pleural effusion. Electronically signed by: Pinkie Pebbles MD 05/22/2024 02:04 AM EDT RP  Workstation: [id number]   VAS US  LOWER EXTREMITY VENOUS (DVT) Result Date: 05/19/2024  Lower Venous DVT Study Patient Name:  Barbara [name] [facility]  Date of Exam:   05/19/2024 Medical Rec #: [id number]               Accession #:  [id number] Date of Birth: Sep 16, 1941               Patient Gender: F Patient Age:   26 years Exam Location:  [facility] Procedure:      VAS US  LOWER EXTREMITY VENOUS (DVT) Referring Phys: TIFFANY George --------------------------------------------------------------------------------  Indications: Edema.  Limitations: Pt is unable to position leg that is adequate for optimal imaging. Comparison Study: No prior Performing Technologist: Elmarie Lindau, RVT  Examination Guidelines: A complete evaluation includes B-mode imaging, spectral Doppler, color Doppler, and power Doppler as needed of all accessible portions of each vessel. Bilateral testing is considered an integral part of a complete examination. Limited examinations for reoccurring indications may be performed as noted. The reflux portion of the exam is performed with the patient in reverse Trendelenburg.  +-----+---------------+---------+-----------+----------+---------------+ RIGHTCompressibilityPhasicitySpontaneityPropertiesThrombus Aging  +-----+---------------+---------+-----------+----------+---------------+ CFV  Full           Yes      Yes                  thickened walls +-----+---------------+---------+-----------+----------+---------------+   +---------+---------------+---------+-----------+----------+--------------+ LEFT     CompressibilityPhasicitySpontaneityPropertiesThrombus Aging +---------+---------------+---------+-----------+----------+--------------+ CFV      Full           Yes      Yes                                 +---------+---------------+---------+-----------+----------+--------------+ SFJ      Full                                                         +---------+---------------+---------+-----------+----------+--------------+ FV Prox  Full                                                        +---------+---------------+---------+-----------+----------+--------------+ FV Mid   Full                                                        +---------+---------------+---------+-----------+----------+--------------+ FV DistalFull                                                        +---------+---------------+---------+-----------+----------+--------------+ PFV      Full                                                        +---------+---------------+---------+-----------+----------+--------------+ POP      Full           Yes      Yes                                 +---------+---------------+---------+-----------+----------+--------------+  PTV      Full                                                        +---------+---------------+---------+-----------+----------+--------------+ PERO     Full                                                        +---------+---------------+---------+-----------+----------+--------------+     Summary: RIGHT: - No evidence of common femoral vein obstruction.  - No cystic structure found in the popliteal fossa.  LEFT: - There is no evidence of deep vein thrombosis in the lower extremity.  - No cystic structure found in the popliteal fossa.  *See table(s) above for measurements and observations. Electronically signed by Gaile New MD on 05/19/2024 at 5:12:14 PM.    Final    ECHOCARDIOGRAM COMPLETE Result Date: 05/18/2024    ECHOCARDIOGRAM REPORT   Patient Name:   Barbara [name] [facility] Date of Exam: 05/18/2024 Medical Rec #:  [id number]              Height:       67.0 in Accession #:    [id number]             Weight:       125.0 lb Date of Birth:  June 18, 1941              BSA:          1.656 m Patient Age:    83 years               BP:           125/47 mmHg Patient Gender: F                       HR:           62 bpm. Exam Location:  Inpatient Procedure: 2D Echo, Cardiac Doppler, Color Doppler and Intracardiac            Opacification Agent (Both Spectral and Color Flow Doppler were            utilized during procedure). Indications:    Elevated Troponin  History:        Patient has prior history of Echocardiogram examinations. Risk                 Factors:Hypertension.  Sonographer:    Vella Key Referring Phys: [id number] DAVID MANUEL ORTIZ IMPRESSIONS  1. There is systolic left ventricular mid-cavity obliteration with apical entrapment. There is no LV outflow obstruction. Left ventricular ejection fraction, by estimation, is >75%. The left ventricle has hyperdynamic function. The left ventricle has no  regional wall motion abnormalities. There is severe asymmetric left ventricular hypertrophy of the apical segment. Left ventricular diastolic parameters are consistent with Grade II diastolic dysfunction (pseudonormalization). Elevated left atrial pressure.  2. Right ventricular systolic function is normal. The right ventricular size is normal. There is normal pulmonary artery systolic pressure. The estimated right ventricular systolic pressure is 30.8 mmHg.  3. Left atrial size was severely dilated.  4. The mitral valve is normal in structure. Mild mitral valve regurgitation.  No evidence of mitral stenosis.  5. The aortic valve is tricuspid. There is mild calcification of the aortic valve. There is mild thickening of the aortic valve. Aortic valve regurgitation is not visualized. Aortic valve sclerosis/calcification is present, without any evidence of aortic stenosis.  6. The inferior vena cava is dilated in size with >50% respiratory variability, suggesting right atrial pressure of 8 mmHg. Comparison(s): No significant change from prior study. Prior images reviewed side by side. 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 FINDINGS  Left  Ventricle: There is systolic left ventricular mid-cavity obliteration with apical entrapment. There is no LV outflow obstruction. Left ventricular ejection fraction, by estimation, is >75%. The left ventricle has hyperdynamic function. The left ventricle has no regional wall motion abnormalities. The left ventricular internal cavity size was normal in size. There is severe asymmetric left ventricular hypertrophy of the apical segment. Left ventricular diastolic parameters are consistent with Grade II diastolic dysfunction (pseudonormalization). Elevated left atrial pressure. Right Ventricle: The right ventricular size is normal. No increase in right ventricular wall thickness. Right ventricular systolic function is normal. There is normal pulmonary artery systolic pressure. The tricuspid regurgitant velocity is 2.39 m/s, and  with an assumed right atrial pressure of 8 mmHg, the estimated right ventricular systolic pressure is 30.8 mmHg. Left Atrium: Left atrial size was severely dilated. Right Atrium: Right atrial size was normal in size. Pericardium: There is no evidence of pericardial effusion. Mitral Valve: The mitral valve is normal in structure. Mild mitral valve regurgitation, with centrally-directed jet. No evidence of mitral valve stenosis. Tricuspid Valve: The tricuspid valve is normal in structure. Tricuspid valve regurgitation is mild. Aortic Valve: The aortic valve is tricuspid. There is mild calcification of the aortic valve. There is mild thickening of the aortic valve. Aortic valve regurgitation is not visualized. Aortic valve sclerosis/calcification is present, without any evidence of aortic stenosis. Pulmonic Valve: The pulmonic valve was grossly normal. Pulmonic valve regurgitation is not visualized. No evidence of pulmonic stenosis. Aorta: The aortic root and ascending aorta are structurally normal, with no evidence of dilitation. Venous: IVC assessment for right atrial pressure unable to be  performed due to mechanical ventilation. The inferior vena cava is dilated in size with greater than 50% respiratory variability, suggesting right atrial pressure of 8 mmHg. IAS/Shunts: No atrial level shunt detected by color flow Doppler. Additional Comments: A device lead is visualized in the right ventricle.  LEFT VENTRICLE PLAX 2D LVIDd:         4.10 cm     Diastology LVIDs:         2.60 cm     LV e' medial:    5.55 cm/s LV PW:         1.10 cm     LV E/e' medial:  15.4 LV IVS:        1.10 cm     LV e' lateral:   6.96 cm/s                            LV E/e' lateral: 12.3  LV Volumes (MOD) LV vol d, MOD A2C: 76.7 ml LV vol d, MOD A4C: 87.0 ml LV vol s, MOD A2C: 12.2 ml LV vol s, MOD A4C: 22.9 ml LV SV MOD A2C:     64.5 ml LV SV MOD A4C:     87.0 ml LV SV MOD BP:      67.6 ml RIGHT VENTRICLE  RV Basal diam:  3.60 cm RV S prime:     10.80 cm/s TAPSE (M-mode): 1.8 cm LEFT ATRIUM            Index        RIGHT ATRIUM           Index LA diam:      4.80 cm  2.90 cm/m   RA Area:     16.10 cm LA Vol (A2C): 131.0 ml 79.10 ml/m  RA Volume:   40.20 ml  24.27 ml/m LA Vol (A4C): 71.6 ml  43.23 ml/m  AORTIC VALVE LVOT Vmax:   134.00 cm/s LVOT Vmean:  87.700 cm/s LVOT VTI:    0.236 m  AORTA Ao Root diam: 2.50 cm Ao Asc diam:  2.50 cm MITRAL VALVE               TRICUSPID VALVE MV Area (PHT): 3.49 cm    TV Peak grad:   22.8 mmHg MV E velocity: 85.70 cm/s  TV Vmax:        2.39 m/s MV A velocity: 62.10 cm/s  TR Peak grad:   22.8 mmHg MV E/A ratio:  1.38        TR Vmax:        239.00 cm/s                             SHUNTS                            Systemic VTI: 0.24 m Jerel Croitoru MD Electronically signed by Jerel Balding MD Signature Date/Time: 05/18/2024/5:26:40 PM    Final    DG Chest Portable 1 View Result Date: 05/18/2024 CLINICAL DATA:  Dyspnea on exertion. EXAM: PORTABLE CHEST - 1 VIEW COMPARISON:  09/26/2023 FINDINGS: Cardiomediastinal silhouette and pulmonary vasculature are within normal limits. Lungs are clear.  Dual lead LEFT chest wall pacemaker is unchanged in configuration. RIGHT reversed total shoulder prosthesis is partially visualized. IMPRESSION: No acute cardiopulmonary process. Electronically Signed   By: Aliene Lloyd M.D.   On: 05/18/2024 12:43   CUP PACEART REMOTE DEVICE CHECK Result Date: 05/11/2024 PPM Scheduled remote reviewed. Normal device function.  Presenting rhythm:  AS-VS, PVCs.  2 AHR, longest 14 min 51 sec, EGMs consistent with AFL.  History of PAF, on Eliquis  per Epic.  2 VHR detections, EGMs consistent with SVT. Next remote 91 days. - CS, CVRS  CT HEAD WO CONTRAST Result Date: 05/08/2024 CLINICAL DATA:  Neuro deficit, concern for stroke, slurred speech, unintentional weight loss. EXAM: CT HEAD WITHOUT CONTRAST TECHNIQUE: Contiguous axial images were obtained from the base of the skull through the vertex without intravenous contrast. RADIATION DOSE REDUCTION: This exam was performed according to the departmental dose-optimization program which includes automated exposure control, adjustment of the mA and/or kV according to patient size and/or use of iterative reconstruction technique. COMPARISON:  None Available. FINDINGS: Brain: No acute intracranial hemorrhage. No CT evidence of acute infarct. No edema, mass effect, or midline shift. The basilar cisterns are patent. Ventricles: The ventricles are normal. Vascular: Atherosclerotic calcifications of the carotid siphons. No hyperdense vessel. Skull: No acute or aggressive finding. Orbits: Left lens replacement.  Orbits are otherwise unremarkable. Sinuses: Mild mucosal thickening in the ethmoid sinuses. Other: Trace fluid in the right mastoid tip. IMPRESSION: No CT evidence of acute intracranial abnormality. Electronically Signed   By: Donnice Mania  M.D.   On: 05/08/2024 13:13    Microbiology: No results found for this or any previous visit (from the past 240 hours).   Labs: Basic Metabolic Panel: Recent Labs  Lab 05/18/24 1132  05/18/24 1427 05/19/24 0416 05/20/24 0423 05/21/24 0358 05/22/24 0437 05/24/24 1011  NA 138  --  135 139 138 135 137  K 3.0*  --  3.5 3.7 3.9 3.7 4.0  CL 104  --  103 104 103 103 106  CO2 25  --  25 28 27 26 22   GLUCOSE 110*  --  107* 98 113* 97 107*  BUN 28*  --  29* 30* 28* 24* 27*  CREATININE 1.61*  --  1.82* 1.47* 1.44* 1.35* 1.39*  CALCIUM  11.5*  --  10.8* 11.0* 10.6* 10.4* 11.5*  MG 1.8  --   --  2.2  --   --   --   PHOS  --  2.0* 2.1* 4.6  --   --   --    Liver Function Tests: Recent Labs  Lab 05/18/24 1132 05/19/24 0416 05/20/24 0423  AST 63* 58*  --   ALT 25 24  --   ALKPHOS 67 74  --   BILITOT 0.7 0.7  --   PROT 7.2 6.5  --   ALBUMIN 2.6* 2.5* 2.5*   No results for input(s): LIPASE, AMYLASE in the last 168 hours. No results for input(s): AMMONIA in the last 168 hours. CBC: Recent Labs  Lab 05/18/24 1132 05/19/24 0416 05/20/24 0423 05/21/24 0358 05/22/24 0437  WBC 3.4* 2.8* 3.3* 4.3 4.4  HGB 11.1* 10.3* 10.4* 10.9* 10.2*  HCT 35.4* 32.9* 33.2* 34.7* 33.4*  MCV 89.6 90.6 88.8 90.1 90.3  PLT 119* 111* 115* 131* 141*   Cardiac Enzymes: No results for input(s): CKTOTAL, CKMB, CKMBINDEX, TROPONINI in the last 168 hours. BNP: BNP (last 3 results) Recent Labs    05/18/24 1132  BNP 852.3*    ProBNP (last 3 results) No results for input(s): PROBNP in the last 8760 hours.  CBG: Recent Labs  Lab 05/18/24 1114  GLUCAP 98       Signed:  Toribio Hummer MD.  [facility] 05/24/2024, 12:18 PM

## 2024-05-24 NOTE — Plan of Care (Signed)
   Problem: Health Behavior/Discharge Planning: Goal: Ability to manage health-related needs will improve Outcome: Progressing   Problem: Clinical Measurements: Goal: Ability to maintain clinical measurements within normal limits will improve Outcome: Progressing Goal: Will remain free from infection Outcome: Progressing Goal: Diagnostic test results will improve Outcome: Progressing

## 2024-05-24 NOTE — Plan of Care (Signed)
  Problem: Health Behavior/Discharge Planning: Goal: Ability to manage health-related needs will improve 05/24/2024 1410 by Alaina Dozier PARAS, RN Outcome: Adequate for Discharge 05/24/2024 0754 by Alaina Dozier PARAS, RN Outcome: Progressing   Problem: Clinical Measurements: Goal: Ability to maintain clinical measurements within normal limits will improve 05/24/2024 1410 by Alaina Dozier PARAS, RN Outcome: Adequate for Discharge 05/24/2024 0754 by Alaina Dozier PARAS, RN Outcome: Progressing Goal: Will remain free from infection 05/24/2024 1410 by Alaina Dozier PARAS, RN Outcome: Adequate for Discharge 05/24/2024 0754 by Alaina Dozier PARAS, RN Outcome: Progressing Goal: Diagnostic test results will improve 05/24/2024 1410 by Alaina Dozier PARAS, RN Outcome: Adequate for Discharge 05/24/2024 0754 by Alaina Dozier PARAS, RN Outcome: Progressing Goal: Respiratory complications will improve Outcome: Adequate for Discharge Goal: Cardiovascular complication will be avoided Outcome: Adequate for Discharge   Problem: Activity: Goal: Risk for activity intolerance will decrease Outcome: Adequate for Discharge   Problem: Nutrition: Goal: Adequate nutrition will be maintained Outcome: Adequate for Discharge   Problem: Coping: Goal: Level of anxiety will decrease Outcome: Adequate for Discharge   Problem: Elimination: Goal: Will not experience complications related to bowel motility Outcome: Adequate for Discharge Goal: Will not experience complications related to urinary retention Outcome: Adequate for Discharge   Problem: Pain Managment: Goal: General experience of comfort will improve and/or be controlled Outcome: Adequate for Discharge   Problem: Safety: Goal: Ability to remain free from injury will improve Outcome: Adequate for Discharge   Problem: Skin Integrity: Goal: Risk for impaired skin integrity will decrease Outcome: Adequate for Discharge    Problem: Education: Goal: Ability to demonstrate management of disease process will improve Outcome: Adequate for Discharge Goal: Ability to verbalize understanding of medication therapies will improve Outcome: Adequate for Discharge Goal: Individualized Educational Video(s) Outcome: Adequate for Discharge   Problem: Activity: Goal: Capacity to carry out activities will improve Outcome: Adequate for Discharge   Problem: Cardiac: Goal: Ability to achieve and maintain adequate cardiopulmonary perfusion will improve Outcome: Adequate for Discharge

## 2024-05-24 NOTE — TOC Progression Note (Signed)
 Transition of Care [facility]) - Progression Note   Patient Details  Name: Barbara [name] MRN: [id number] Date of Birth: [birth year]  [facility] CM/SW Contact  Duwaine GORMAN Aran, LCSW Phone Number: 05/24/2024, 8:55 AM  Clinical Narrative: CSW received call from [place] with [facility] and the PTAR request was denied, so patient will need to have family transport patient to rehab.  Expected Discharge Plan: Skilled Nursing Facility Barriers to Discharge: Continued Medical Work up  Expected Discharge Plan and Services Living arrangements for the past 2 months: Single Family Home Expected Discharge Date: 05/22/24                Social Determinants of Health (SDOH) Interventions SDOH Screenings   Food Insecurity: No Food Insecurity (05/18/2024)  Housing: Low Risk  (05/18/2024)  Transportation Needs: Unmet Transportation Needs (05/18/2024)  Utilities: Not At Risk (05/18/2024)  Alcohol Screen: Low Risk  (08/05/2022)  Depression (PHQ2-9): Low Risk  (04/05/2024)  Financial Resource Strain: Low Risk  (08/05/2022)  Physical Activity: Insufficiently Active (07/02/2022)  Social Connections: Moderately Integrated (05/18/2024)  Stress: No Stress Concern Present (07/02/2022)  Tobacco Use: Medium Risk (05/20/2024)   Readmission Risk Interventions    03/11/2024    1:07 PM  Readmission Risk Prevention Plan  Transportation Screening Complete  PCP or Specialist Appt within 5-7 Days Complete  Home Care Screening Complete  Medication Review (RN CM) Complete

## 2024-05-25 DIAGNOSIS — N1832 Chronic kidney disease, stage 3b: Secondary | ICD-10-CM | POA: Diagnosis not present

## 2024-05-25 DIAGNOSIS — I5032 Chronic diastolic (congestive) heart failure: Secondary | ICD-10-CM | POA: Diagnosis not present

## 2024-05-25 DIAGNOSIS — R490 Dysphonia: Secondary | ICD-10-CM | POA: Diagnosis not present

## 2024-05-25 DIAGNOSIS — E440 Moderate protein-calorie malnutrition: Secondary | ICD-10-CM | POA: Diagnosis not present

## 2024-05-25 DIAGNOSIS — I130 Hypertensive heart and chronic kidney disease with heart failure and stage 1 through stage 4 chronic kidney disease, or unspecified chronic kidney disease: Secondary | ICD-10-CM | POA: Diagnosis not present

## 2024-05-25 DIAGNOSIS — D61818 Other pancytopenia: Secondary | ICD-10-CM | POA: Diagnosis not present

## 2024-05-25 DIAGNOSIS — I4891 Unspecified atrial fibrillation: Secondary | ICD-10-CM | POA: Diagnosis not present

## 2024-05-26 ENCOUNTER — Ambulatory Visit (INDEPENDENT_AMBULATORY_CARE_PROVIDER_SITE_OTHER): Admitting: Podiatry

## 2024-05-26 DIAGNOSIS — Z91198 Patient's noncompliance with other medical treatment and regimen for other reason: Secondary | ICD-10-CM

## 2024-05-27 DIAGNOSIS — I10 Essential (primary) hypertension: Secondary | ICD-10-CM | POA: Diagnosis not present

## 2024-05-27 DIAGNOSIS — D61818 Other pancytopenia: Secondary | ICD-10-CM | POA: Diagnosis not present

## 2024-05-27 DIAGNOSIS — I517 Cardiomegaly: Secondary | ICD-10-CM | POA: Diagnosis not present

## 2024-05-27 DIAGNOSIS — I340 Nonrheumatic mitral (valve) insufficiency: Secondary | ICD-10-CM | POA: Diagnosis not present

## 2024-05-27 DIAGNOSIS — I5030 Unspecified diastolic (congestive) heart failure: Secondary | ICD-10-CM | POA: Diagnosis not present

## 2024-05-27 DIAGNOSIS — R490 Dysphonia: Secondary | ICD-10-CM | POA: Diagnosis not present

## 2024-05-27 DIAGNOSIS — I130 Hypertensive heart and chronic kidney disease with heart failure and stage 1 through stage 4 chronic kidney disease, or unspecified chronic kidney disease: Secondary | ICD-10-CM | POA: Diagnosis not present

## 2024-05-27 DIAGNOSIS — I5189 Other ill-defined heart diseases: Secondary | ICD-10-CM | POA: Diagnosis not present

## 2024-05-27 NOTE — Progress Notes (Signed)
 Failure to attend appointment with reason given Rescheduled due to lack of transportation.

## 2024-05-31 DIAGNOSIS — I517 Cardiomegaly: Secondary | ICD-10-CM | POA: Diagnosis not present

## 2024-05-31 DIAGNOSIS — R490 Dysphonia: Secondary | ICD-10-CM | POA: Diagnosis not present

## 2024-05-31 DIAGNOSIS — I130 Hypertensive heart and chronic kidney disease with heart failure and stage 1 through stage 4 chronic kidney disease, or unspecified chronic kidney disease: Secondary | ICD-10-CM | POA: Diagnosis not present

## 2024-05-31 DIAGNOSIS — I340 Nonrheumatic mitral (valve) insufficiency: Secondary | ICD-10-CM | POA: Diagnosis not present

## 2024-05-31 DIAGNOSIS — I5030 Unspecified diastolic (congestive) heart failure: Secondary | ICD-10-CM | POA: Diagnosis not present

## 2024-05-31 DIAGNOSIS — D61818 Other pancytopenia: Secondary | ICD-10-CM | POA: Diagnosis not present

## 2024-06-01 DIAGNOSIS — I130 Hypertensive heart and chronic kidney disease with heart failure and stage 1 through stage 4 chronic kidney disease, or unspecified chronic kidney disease: Secondary | ICD-10-CM | POA: Diagnosis not present

## 2024-06-01 DIAGNOSIS — I480 Paroxysmal atrial fibrillation: Secondary | ICD-10-CM | POA: Diagnosis not present

## 2024-06-01 DIAGNOSIS — N1832 Chronic kidney disease, stage 3b: Secondary | ICD-10-CM | POA: Diagnosis not present

## 2024-06-01 DIAGNOSIS — R490 Dysphonia: Secondary | ICD-10-CM | POA: Diagnosis not present

## 2024-06-01 DIAGNOSIS — E119 Type 2 diabetes mellitus without complications: Secondary | ICD-10-CM | POA: Diagnosis not present

## 2024-06-01 DIAGNOSIS — E440 Moderate protein-calorie malnutrition: Secondary | ICD-10-CM | POA: Diagnosis not present

## 2024-06-01 DIAGNOSIS — I5033 Acute on chronic diastolic (congestive) heart failure: Secondary | ICD-10-CM | POA: Diagnosis not present

## 2024-06-02 DIAGNOSIS — R490 Dysphonia: Secondary | ICD-10-CM | POA: Diagnosis not present

## 2024-06-02 DIAGNOSIS — D61818 Other pancytopenia: Secondary | ICD-10-CM | POA: Diagnosis not present

## 2024-06-02 DIAGNOSIS — I517 Cardiomegaly: Secondary | ICD-10-CM | POA: Diagnosis not present

## 2024-06-02 DIAGNOSIS — I5189 Other ill-defined heart diseases: Secondary | ICD-10-CM | POA: Diagnosis not present

## 2024-06-02 DIAGNOSIS — I130 Hypertensive heart and chronic kidney disease with heart failure and stage 1 through stage 4 chronic kidney disease, or unspecified chronic kidney disease: Secondary | ICD-10-CM | POA: Diagnosis not present

## 2024-06-02 DIAGNOSIS — Z09 Encounter for follow-up examination after completed treatment for conditions other than malignant neoplasm: Secondary | ICD-10-CM | POA: Diagnosis not present

## 2024-06-02 DIAGNOSIS — I340 Nonrheumatic mitral (valve) insufficiency: Secondary | ICD-10-CM | POA: Diagnosis not present

## 2024-06-02 DIAGNOSIS — I5030 Unspecified diastolic (congestive) heart failure: Secondary | ICD-10-CM | POA: Diagnosis not present

## 2024-06-02 DIAGNOSIS — N1832 Chronic kidney disease, stage 3b: Secondary | ICD-10-CM | POA: Diagnosis not present

## 2024-06-03 DIAGNOSIS — I130 Hypertensive heart and chronic kidney disease with heart failure and stage 1 through stage 4 chronic kidney disease, or unspecified chronic kidney disease: Secondary | ICD-10-CM | POA: Diagnosis not present

## 2024-06-03 DIAGNOSIS — I509 Heart failure, unspecified: Secondary | ICD-10-CM | POA: Diagnosis not present

## 2024-06-03 DIAGNOSIS — Z5189 Encounter for other specified aftercare: Secondary | ICD-10-CM | POA: Diagnosis not present

## 2024-06-03 DIAGNOSIS — N183 Chronic kidney disease, stage 3 unspecified: Secondary | ICD-10-CM | POA: Diagnosis not present

## 2024-06-04 ENCOUNTER — Encounter: Payer: Self-pay | Admitting: Pulmonary Disease

## 2024-06-05 ENCOUNTER — Ambulatory Visit: Payer: Self-pay | Admitting: Cardiology

## 2024-06-08 ENCOUNTER — Ambulatory Visit (HOSPITAL_COMMUNITY)
Admission: RE | Admit: 2024-06-08 | Discharge: 2024-06-08 | Disposition: A | Discharge status: Home / Self Care | Source: Ambulatory Visit | Attending: Cardiology | Admitting: Cardiology

## 2024-06-08 DIAGNOSIS — I340 Nonrheumatic mitral (valve) insufficiency: Secondary | ICD-10-CM

## 2024-06-08 DIAGNOSIS — Z950 Presence of cardiac pacemaker: Secondary | ICD-10-CM | POA: Diagnosis not present

## 2024-06-08 DIAGNOSIS — I110 Hypertensive heart disease with heart failure: Secondary | ICD-10-CM | POA: Diagnosis not present

## 2024-06-08 DIAGNOSIS — N183 Chronic kidney disease, stage 3 unspecified: Secondary | ICD-10-CM | POA: Diagnosis not present

## 2024-06-08 DIAGNOSIS — I10 Essential (primary) hypertension: Secondary | ICD-10-CM | POA: Diagnosis not present

## 2024-06-08 DIAGNOSIS — R609 Edema, unspecified: Secondary | ICD-10-CM | POA: Diagnosis not present

## 2024-06-08 DIAGNOSIS — I4891 Unspecified atrial fibrillation: Secondary | ICD-10-CM | POA: Diagnosis not present

## 2024-06-08 DIAGNOSIS — I130 Hypertensive heart and chronic kidney disease with heart failure and stage 1 through stage 4 chronic kidney disease, or unspecified chronic kidney disease: Secondary | ICD-10-CM | POA: Diagnosis not present

## 2024-06-09 ENCOUNTER — Telehealth: Payer: Self-pay | Admitting: *Deleted

## 2024-06-09 NOTE — Progress Notes (Unsigned)
 Complex Care Management Care Guide Note  06/09/2024 Name: Barbara [name] MRN: [id number] DOB: [birth year]  Barbara [name] is a 83 y.o. year old female who is a primary care patient of Johnny Garnette LABOR, MD and is actively engaged with the care management team. I reached out to Barbara [name] by phone today to assist with re-scheduling  with the Licensed Clinical Child psychotherapist.  Follow up plan: Unsuccessful telephone outreach attempt made. A HIPAA compliant phone message was left for the patient providing contact information and requesting a return call.  Thedford Franks, CMA [place]  [facility], [facility] Guide Direct Dial: [phone]  Fax: [phone] Website: [email or web address]

## 2024-06-10 NOTE — Progress Notes (Signed)
 Complex Care Management Care Guide Note  06/10/2024 Name: [name] MRN: [id number] DOB: 04-21-1941  Barbara [name] is a 83 y.o. year old female who is a primary care patient of Johnny Garnette LABOR, MD and is actively engaged with the care management team. I reached out to Charolette CHRISTELLA Crimes by phone today to assist with re-scheduling  with the Licensed Clinical Child psychotherapist.  Follow up plan: Unsuccessful telephone outreach attempt made. A HIPAA compliant phone message was left for the patient providing contact information and requesting a return call. No further outreach attempts will be made due to inability to maintain patient contact.   Thedford Franks, CMA [place]  [facility], [facility] Guide Direct Dial: [phone]  Fax: [phone] Website: [email or web address]

## 2024-06-15 DIAGNOSIS — Z950 Presence of cardiac pacemaker: Secondary | ICD-10-CM | POA: Diagnosis not present

## 2024-06-15 DIAGNOSIS — I110 Hypertensive heart disease with heart failure: Secondary | ICD-10-CM | POA: Diagnosis not present

## 2024-06-15 DIAGNOSIS — I4891 Unspecified atrial fibrillation: Secondary | ICD-10-CM | POA: Diagnosis not present

## 2024-06-15 DIAGNOSIS — N183 Chronic kidney disease, stage 3 unspecified: Secondary | ICD-10-CM | POA: Diagnosis not present

## 2024-06-17 ENCOUNTER — Telehealth: Payer: Self-pay

## 2024-06-17 ENCOUNTER — Ambulatory Visit: Payer: Self-pay

## 2024-06-17 DIAGNOSIS — J3801 Paralysis of vocal cords and larynx, unilateral: Secondary | ICD-10-CM | POA: Diagnosis not present

## 2024-06-17 NOTE — Telephone Encounter (Signed)
 Copied from CRM [id number]. Topic: General - Other >> Jun 16, 2024 11:30 AM [place] A wrote: Reason for CRM:  [name] Davina called and was asking if Dr.Fry had any input or suffestions because the Social Worker [employer], at [facility]  has not completed insurane paperwork for patient. Mr. Bauza would like a call back at [phone] he states that he has turned in [place] documents to office.

## 2024-06-17 NOTE — Telephone Encounter (Signed)
 FYI Only or Action Required?: FYI only for provider.  Patient was last seen in primary care on 05/17/2024 by Johnny Garnette LABOR, MD.  Called Nurse Triage reporting Back Pain.  Symptoms began several days ago. 3 days ago   Interventions attempted: OTC medications: Tylenol .  Symptoms are: gradually worsening.  Triage Disposition: See PCP When Office is Open (Within 3 Days)  Patient/caregiver understands and will follow disposition?: Yes Copied from CRM #[id number]. Topic: Clinical - Red Word Triage >> Jun 17, 2024  8:12 AM Larissa RAMAN wrote: [facility] that prompted transfer to Nurse Triage: back pain Reason for Disposition  [1] MODERATE back pain (e.g., interferes with normal activities) AND [2] present > 3 days  Answer Assessment - Initial Assessment Questions 1. ONSET: When did the pain begin? (e.g., minutes, hours, days)     3 days ago  2. LOCATION: Where does it hurt? (upper, mid or lower back)     Lower back  3. SEVERITY: How bad is the pain?  (e.g., Scale 1-10; mild, moderate, or severe)     Moderate  4. PATTERN: Is the pain constant? (e.g., yes, no; constant, intermittent)      Intermittent  5. RADIATION: Does the pain shoot into your legs or somewhere else?     No  6. CAUSE:  What do you think is causing the back pain?      Went to sit in wheelchair and didn't realize how low it was  7. BACK OVERUSE:  Any recent lifting of heavy objects, strenuous work or exercise?     No  8. MEDICINES: What have you taken so far for the pain? (e.g., nothing, acetaminophen , NSAIDS)     Tylenol   9. NEUROLOGIC SYMPTOMS: Do you have any weakness, numbness, or problems with bowel/bladder control?     No  10. OTHER SYMPTOMS: Do you have any other symptoms? (e.g., fever, abdomen pain, burning with urination, blood in urine)       No  11. PREGNANCY: Is there any chance you are pregnant? When was your last menstrual period?       No  Protocols used: Back  Pain-A-AH

## 2024-06-17 NOTE — Telephone Encounter (Signed)
 Pt has appointment with Dr Johnny on 06/18/24, will discuss this at the visit

## 2024-06-17 NOTE — Telephone Encounter (Signed)
 Pt has appointment with Dr Johnny on  06/18/24 for this problem

## 2024-06-17 NOTE — Telephone Encounter (Signed)
 I am not sure what he wants us  to do.

## 2024-06-18 ENCOUNTER — Telehealth: Payer: Self-pay

## 2024-06-18 ENCOUNTER — Encounter: Payer: Self-pay | Admitting: Family Medicine

## 2024-06-18 ENCOUNTER — Telehealth: Payer: Self-pay | Admitting: *Deleted

## 2024-06-18 ENCOUNTER — Ambulatory Visit (INDEPENDENT_AMBULATORY_CARE_PROVIDER_SITE_OTHER): Admitting: Family Medicine

## 2024-06-18 VITALS — BP 140/50 | HR 93 | Temp 97.9°F | Wt 113.4 lb

## 2024-06-18 DIAGNOSIS — R630 Anorexia: Secondary | ICD-10-CM

## 2024-06-18 DIAGNOSIS — I5032 Chronic diastolic (congestive) heart failure: Secondary | ICD-10-CM

## 2024-06-18 DIAGNOSIS — N1832 Chronic kidney disease, stage 3b: Secondary | ICD-10-CM

## 2024-06-18 DIAGNOSIS — R627 Adult failure to thrive: Secondary | ICD-10-CM

## 2024-06-18 DIAGNOSIS — E876 Hypokalemia: Secondary | ICD-10-CM | POA: Diagnosis not present

## 2024-06-18 MED ORDER — METHOCARBAMOL 500 MG PO TABS: 500.0000 mg | ORAL_TABLET | Freq: Four times a day (QID) | ORAL | 2 refills | Status: DC | PRN

## 2024-06-18 NOTE — Progress Notes (Signed)
   Subjective:    Patient ID: Barbara [name], female    DOB: 09-09-1941, 83 y.o.   MRN: [id number]  HPI Here with her friend to follow up from a hospital stay from 05-18-24 to 05-22-24, and then a stay at [facility] rehab facility until 06-16-24. In the hospital she was found to have an acute exacerbation of chronic diastolic heart failure. An ECH revealed an EF of 75% but she had severe asymmetrical LVH which was causing severe left atrial dilation and Grade II diastolic dysfunction. She was diuresed with IV Lasix  which was then changed to oral. Her WBC was 3.4 and Hgb was 11.1. Her creatinine was stable at 1.61. The corrected calcium  was elevated to 12.6. She had been taking 2000 units of vitamin D  at home before this stay, and she was told to stop all vitamin D  since this can contribute to a high calcium  level. She getting back home she seems to be doing better. Her appetite has picked up a little, and she is eating 3 small meals a day. She takes a protein shake daily as well. Apparently she got very little therapy at the rehab facility, so she is still weak and cannot walk. 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 He was going to check with us  about clearance.    Review of Systems  Constitutional:  Positive for fatigue.  Respiratory: Negative.    Cardiovascular: Negative.   Gastrointestinal: Negative.   Genitourinary: Negative.   Neurological:  Positive for weakness.       Objective:   Physical Exam Constitutional:      Comments: In a wheelchair, thin, frail   Cardiovascular:     Rate and Rhythm: Normal rate and regular rhythm.     Pulses: Normal pulses.     Heart sounds: Normal heart sounds.  Pulmonary:     Effort: Pulmonary effort is normal.     Breath sounds: Normal breath sounds.  Abdominal:     General: Abdomen is flat. Bowel sounds are normal. There is no distension.     Palpations: Abdomen  is soft. There is no mass.     Tenderness: There is no abdominal tenderness. There is no guarding or rebound.     Hernia: No hernia is present.  Musculoskeletal:     Comments: Trace edema in both ankles   Neurological:     Mental Status: She is alert and oriented to person, place, and time.           Assessment & Plan:  She has failure to thrive for a number of reasons, but she has benefited from her recent hospital stay and rehab stay. Her electrolytes were corrected and she was diuresed. Her appetite has improved, and the family will continue to encourage her to eat as much food as she can. Her vitamin D  has been stopped, so hopefully this will help keep the calcium  level down. Today we will get a BMET, along with phosphorus and magnesium  levels. We will refer her to home health for an aide to assist her up to 35 hours a week and for PT to work with her. We will see her back in 2 weeks. We spent a total of ( 35  ) minutes reviewing records and discussing these issues.  Garnette Olmsted, MD

## 2024-06-18 NOTE — Telephone Encounter (Signed)
 Copied from CRM [id number]. Topic: General - Other >> Jun 18, 2024 11:34 AM Carlyon D wrote: Reason for CRM: Shelia with [facility]... pt left [facility] wed. Pt need  - ot skilled nursing and speech.  Holli needs verbal orders today to start Monday please call back and ask for christie or anna  back at [phone]

## 2024-06-18 NOTE — Telephone Encounter (Signed)
 Okay, and she needs PT as well

## 2024-06-18 NOTE — Telephone Encounter (Signed)
   Pre-operative Risk Assessment    Patient Name: Barbara [name]  DOB: July 11, 1941 MRN: [id number]   Date of last office visit: 04/26/24 DR. [facility] Date of next office visit: 08/02/24 DR. [facility]   Request for Surgical Clearance    Procedure:  SMDL WITH PROLARYN INJECTION, JET VENTILATION   Date of Surgery:  Clearance TBD                                Surgeon:  DR. CARLIE Surgeon's Group or Practice Name:  [facility]  Phone number:  [phone] Fax number:  [phone]   Type of Clearance Requested:   - Medical  - Pharmacy:  Hold Apixaban  (Eliquis ) (HOWEVER; ONE PG STATES TO HOLD AND ANOTHER PAGE HAS A NOTE FROM DR. BATES STATING HE IS OK WITH PT BEING ON ELIQUIS  FOR THE PROCEDURE). I LEFT A MESSAGE FOR SURGERY SCHEDULER TO CALL BACK AND CONFIRM IF NEED RECOMMENDATIONS ON ELIQUIS  HOLD OR NOT.    Type of Anesthesia:  Not Indicated (NEED ANESTHESIA TYPE TO BE USED)   Additional requests/questions:    Barbara [name]   06/18/2024, 10:33 AM

## 2024-06-19 LAB — BASIC METABOLIC PANEL WITH GFR
BUN/Creatinine Ratio: 23 (calc) — ABNORMAL HIGH (ref 6–22)
BUN: 30 mg/dL — ABNORMAL HIGH (ref 7–25)
CO2: 21 mmol/L (ref 20–32)
Calcium: 11.6 mg/dL — ABNORMAL HIGH (ref 8.6–10.4)
Chloride: 107 mmol/L (ref 98–110)
Creat: 1.3 mg/dL — ABNORMAL HIGH (ref 0.60–0.95)
Glucose, Bld: 103 mg/dL — ABNORMAL HIGH (ref 65–99)
Potassium: 4.2 mmol/L (ref 3.5–5.3)
Sodium: 137 mmol/L (ref 135–146)
eGFR: 40 mL/min/1.73m2 — ABNORMAL LOW (ref >=60)

## 2024-06-19 LAB — MAGNESIUM: Magnesium: 2.2 mg/dL (ref 1.5–2.5)

## 2024-06-19 LAB — PHOSPHORUS: Phosphorus: 2.6 mg/dL (ref 2.1–4.3)

## 2024-06-20 ENCOUNTER — Emergency Department (HOSPITAL_COMMUNITY)

## 2024-06-20 ENCOUNTER — Inpatient Hospital Stay (HOSPITAL_COMMUNITY)
Admission: EM | Admit: 2024-06-20 | Discharge: 2024-06-28 | DRG: 309 | Disposition: A | Discharge status: Home Health | Attending: Family Medicine | Admitting: Family Medicine

## 2024-06-20 ENCOUNTER — Encounter (HOSPITAL_COMMUNITY): Payer: Self-pay

## 2024-06-20 ENCOUNTER — Other Ambulatory Visit: Payer: Self-pay

## 2024-06-20 DIAGNOSIS — M7989 Other specified soft tissue disorders: Secondary | ICD-10-CM | POA: Diagnosis present

## 2024-06-20 DIAGNOSIS — Z8249 Family history of ischemic heart disease and other diseases of the circulatory system: Secondary | ICD-10-CM

## 2024-06-20 DIAGNOSIS — Z91048 Other nonmedicinal substance allergy status: Secondary | ICD-10-CM

## 2024-06-20 DIAGNOSIS — Z96611 Presence of right artificial shoulder joint: Secondary | ICD-10-CM | POA: Diagnosis not present

## 2024-06-20 DIAGNOSIS — I4819 Other persistent atrial fibrillation: Secondary | ICD-10-CM | POA: Diagnosis not present

## 2024-06-20 DIAGNOSIS — I509 Heart failure, unspecified: Secondary | ICD-10-CM | POA: Diagnosis not present

## 2024-06-20 DIAGNOSIS — Z90710 Acquired absence of both cervix and uterus: Secondary | ICD-10-CM

## 2024-06-20 DIAGNOSIS — Z79899 Other long term (current) drug therapy: Secondary | ICD-10-CM

## 2024-06-20 DIAGNOSIS — I422 Other hypertrophic cardiomyopathy: Secondary | ICD-10-CM | POA: Diagnosis not present

## 2024-06-20 DIAGNOSIS — I480 Paroxysmal atrial fibrillation: Secondary | ICD-10-CM | POA: Diagnosis present

## 2024-06-20 DIAGNOSIS — E8809 Other disorders of plasma-protein metabolism, not elsewhere classified: Secondary | ICD-10-CM | POA: Diagnosis present

## 2024-06-20 DIAGNOSIS — G4733 Obstructive sleep apnea (adult) (pediatric): Secondary | ICD-10-CM | POA: Diagnosis present

## 2024-06-20 DIAGNOSIS — Z1152 Encounter for screening for COVID-19: Secondary | ICD-10-CM

## 2024-06-20 DIAGNOSIS — I10 Essential (primary) hypertension: Secondary | ICD-10-CM | POA: Diagnosis present

## 2024-06-20 DIAGNOSIS — R9389 Abnormal findings on diagnostic imaging of other specified body structures: Secondary | ICD-10-CM | POA: Diagnosis not present

## 2024-06-20 DIAGNOSIS — I2510 Atherosclerotic heart disease of native coronary artery without angina pectoris: Secondary | ICD-10-CM | POA: Diagnosis not present

## 2024-06-20 DIAGNOSIS — Z87891 Personal history of nicotine dependence: Secondary | ICD-10-CM

## 2024-06-20 DIAGNOSIS — N1832 Chronic kidney disease, stage 3b: Secondary | ICD-10-CM | POA: Diagnosis present

## 2024-06-20 DIAGNOSIS — I421 Obstructive hypertrophic cardiomyopathy: Secondary | ICD-10-CM | POA: Diagnosis present

## 2024-06-20 DIAGNOSIS — I272 Pulmonary hypertension, unspecified: Secondary | ICD-10-CM | POA: Diagnosis present

## 2024-06-20 DIAGNOSIS — R634 Abnormal weight loss: Secondary | ICD-10-CM | POA: Diagnosis present

## 2024-06-20 DIAGNOSIS — I495 Sick sinus syndrome: Secondary | ICD-10-CM | POA: Diagnosis present

## 2024-06-20 DIAGNOSIS — Z87892 Personal history of anaphylaxis: Secondary | ICD-10-CM

## 2024-06-20 DIAGNOSIS — Z5982 Transportation insecurity: Secondary | ICD-10-CM

## 2024-06-20 DIAGNOSIS — I252 Old myocardial infarction: Secondary | ICD-10-CM

## 2024-06-20 DIAGNOSIS — R0602 Shortness of breath: Secondary | ICD-10-CM | POA: Diagnosis not present

## 2024-06-20 DIAGNOSIS — R64 Cachexia: Secondary | ICD-10-CM | POA: Diagnosis present

## 2024-06-20 DIAGNOSIS — Z860100 Personal history of colon polyps, unspecified: Secondary | ICD-10-CM

## 2024-06-20 DIAGNOSIS — Z7984 Long term (current) use of oral hypoglycemic drugs: Secondary | ICD-10-CM

## 2024-06-20 DIAGNOSIS — E785 Hyperlipidemia, unspecified: Secondary | ICD-10-CM | POA: Diagnosis present

## 2024-06-20 DIAGNOSIS — R110 Nausea: Secondary | ICD-10-CM | POA: Diagnosis present

## 2024-06-20 DIAGNOSIS — Z91040 Latex allergy status: Secondary | ICD-10-CM

## 2024-06-20 DIAGNOSIS — Z681 Body mass index (BMI) 19 or less, adult: Secondary | ICD-10-CM

## 2024-06-20 DIAGNOSIS — Z882 Allergy status to sulfonamides status: Secondary | ICD-10-CM

## 2024-06-20 DIAGNOSIS — E440 Moderate protein-calorie malnutrition: Secondary | ICD-10-CM | POA: Diagnosis present

## 2024-06-20 DIAGNOSIS — I2489 Other forms of acute ischemic heart disease: Secondary | ICD-10-CM | POA: Diagnosis present

## 2024-06-20 DIAGNOSIS — Z87442 Personal history of urinary calculi: Secondary | ICD-10-CM

## 2024-06-20 DIAGNOSIS — Z7901 Long term (current) use of anticoagulants: Secondary | ICD-10-CM

## 2024-06-20 DIAGNOSIS — I4891 Unspecified atrial fibrillation: Secondary | ICD-10-CM

## 2024-06-20 DIAGNOSIS — R0600 Dyspnea, unspecified: Principal | ICD-10-CM

## 2024-06-20 DIAGNOSIS — R6883 Chills (without fever): Secondary | ICD-10-CM | POA: Diagnosis present

## 2024-06-20 DIAGNOSIS — R002 Palpitations: Secondary | ICD-10-CM | POA: Diagnosis present

## 2024-06-20 DIAGNOSIS — Z955 Presence of coronary angioplasty implant and graft: Secondary | ICD-10-CM

## 2024-06-20 DIAGNOSIS — Z95810 Presence of automatic (implantable) cardiac defibrillator: Secondary | ICD-10-CM

## 2024-06-20 DIAGNOSIS — I130 Hypertensive heart and chronic kidney disease with heart failure and stage 1 through stage 4 chronic kidney disease, or unspecified chronic kidney disease: Secondary | ICD-10-CM | POA: Diagnosis present

## 2024-06-20 DIAGNOSIS — I5032 Chronic diastolic (congestive) heart failure: Secondary | ICD-10-CM | POA: Diagnosis present

## 2024-06-20 DIAGNOSIS — Z888 Allergy status to other drugs, medicaments and biological substances status: Secondary | ICD-10-CM

## 2024-06-20 DIAGNOSIS — E1122 Type 2 diabetes mellitus with diabetic chronic kidney disease: Secondary | ICD-10-CM | POA: Diagnosis present

## 2024-06-20 DIAGNOSIS — R627 Adult failure to thrive: Secondary | ICD-10-CM | POA: Diagnosis present

## 2024-06-20 DIAGNOSIS — J90 Pleural effusion, not elsewhere classified: Secondary | ICD-10-CM | POA: Diagnosis not present

## 2024-06-20 DIAGNOSIS — R0609 Other forms of dyspnea: Secondary | ICD-10-CM | POA: Diagnosis present

## 2024-06-20 DIAGNOSIS — K219 Gastro-esophageal reflux disease without esophagitis: Secondary | ICD-10-CM | POA: Diagnosis present

## 2024-06-20 DIAGNOSIS — R531 Weakness: Secondary | ICD-10-CM | POA: Diagnosis present

## 2024-06-20 DIAGNOSIS — Z885 Allergy status to narcotic agent status: Secondary | ICD-10-CM

## 2024-06-20 DIAGNOSIS — Z96642 Presence of left artificial hip joint: Secondary | ICD-10-CM | POA: Diagnosis present

## 2024-06-20 DIAGNOSIS — Z8673 Personal history of transient ischemic attack (TIA), and cerebral infarction without residual deficits: Secondary | ICD-10-CM

## 2024-06-20 DIAGNOSIS — D631 Anemia in chronic kidney disease: Secondary | ICD-10-CM | POA: Diagnosis present

## 2024-06-20 DIAGNOSIS — Z950 Presence of cardiac pacemaker: Secondary | ICD-10-CM | POA: Diagnosis not present

## 2024-06-20 LAB — COMPREHENSIVE METABOLIC PANEL WITH GFR
ALT: 15 U/L (ref 0–44)
AST: 43 U/L — ABNORMAL HIGH (ref 15–41)
Albumin: 2.9 g/dL — ABNORMAL LOW (ref 3.5–5.0)
Alkaline Phosphatase: 116 U/L (ref 38–126)
Anion gap: 12 (ref 5–15)
BUN: 36 mg/dL — ABNORMAL HIGH (ref 8–23)
CO2: 20 mmol/L — ABNORMAL LOW (ref 22–32)
Calcium: 11.2 mg/dL — ABNORMAL HIGH (ref 8.9–10.3)
Chloride: 103 mmol/L (ref 98–111)
Creatinine, Ser: 1.33 mg/dL — ABNORMAL HIGH (ref 0.44–1.00)
GFR, Estimated: 39 mL/min — ABNORMAL LOW (ref >60)
Glucose, Bld: 118 mg/dL — ABNORMAL HIGH (ref 70–99)
Potassium: 3.5 mmol/L (ref 3.5–5.1)
Sodium: 135 mmol/L (ref 135–145)
Total Bilirubin: 0.7 mg/dL (ref 0.0–1.2)
Total Protein: 8.2 g/dL — ABNORMAL HIGH (ref 6.5–8.1)

## 2024-06-20 LAB — CBC WITH DIFFERENTIAL/PLATELET
Abs Immature Granulocytes: 0.01 K/uL (ref 0.00–0.07)
Basophils Absolute: 0 K/uL (ref 0.0–0.1)
Basophils Relative: 0 %
Eosinophils Absolute: 0.1 K/uL (ref 0.0–0.5)
Eosinophils Relative: 3 %
HCT: 37.7 % (ref 36.0–46.0)
Hemoglobin: 11.7 g/dL — ABNORMAL LOW (ref 12.0–15.0)
Immature Granulocytes: 0 %
Lymphocytes Relative: 14 %
Lymphs Abs: 0.6 K/uL — ABNORMAL LOW (ref 0.7–4.0)
MCH: 27.8 pg (ref 26.0–34.0)
MCHC: 31 g/dL (ref 30.0–36.0)
MCV: 89.5 fL (ref 80.0–100.0)
Monocytes Absolute: 0.2 K/uL (ref 0.1–1.0)
Monocytes Relative: 5 %
Neutro Abs: 3.1 K/uL (ref 1.7–7.7)
Neutrophils Relative %: 78 %
Platelets: 224 K/uL (ref 150–400)
RBC: 4.21 MIL/uL (ref 3.87–5.11)
RDW: 14.6 % (ref 11.5–15.5)
WBC: 4 K/uL (ref 4.0–10.5)
nRBC: 0 % (ref 0.0–0.2)

## 2024-06-20 LAB — URINALYSIS, ROUTINE W REFLEX MICROSCOPIC
Bacteria, UA: NONE SEEN
Bilirubin Urine: NEGATIVE
Glucose, UA: >=500 mg/dL — AB
Hgb urine dipstick: NEGATIVE
Ketones, ur: NEGATIVE mg/dL
Leukocytes,Ua: NEGATIVE
Nitrite: NEGATIVE
Protein, ur: NEGATIVE mg/dL
Specific Gravity, Urine: 1.02 (ref 1.005–1.030)
pH: 5 (ref 5.0–8.0)

## 2024-06-20 LAB — BRAIN NATRIURETIC PEPTIDE: B Natriuretic Peptide: 980.3 pg/mL — ABNORMAL HIGH (ref 0.0–100.0)

## 2024-06-20 LAB — RESP PANEL BY RT-PCR (RSV, FLU A&B, COVID)  RVPGX2
Influenza A by PCR: NEGATIVE
Influenza B by PCR: NEGATIVE
Resp Syncytial Virus by PCR: NEGATIVE
SARS Coronavirus 2 by RT PCR: NEGATIVE

## 2024-06-20 LAB — TROPONIN I (HIGH SENSITIVITY)
Troponin I (High Sensitivity): 408 ng/L (ref <18)
Troponin I (High Sensitivity): 398 ng/L (ref <18)

## 2024-06-20 LAB — MAGNESIUM: Magnesium: 2 mg/dL (ref 1.7–2.4)

## 2024-06-20 MED ORDER — ONDANSETRON HCL 4 MG PO TABS: 4.0000 mg | ORAL_TABLET | Freq: Four times a day (QID) | ORAL | Status: DC | PRN

## 2024-06-20 MED ORDER — ISOSORBIDE MONONITRATE ER 30 MG PO TB24
30.0000 mg | ORAL_TABLET | Freq: Every day | ORAL | Status: DC
  Administered 2024-06-21 – 2024-06-28 (×8): 30 mg via ORAL
  Filled 2024-06-20 (×8): qty 1

## 2024-06-20 MED ORDER — BISACODYL 5 MG PO TBEC
5.0000 mg | DELAYED_RELEASE_TABLET | Freq: Every day | ORAL | Status: DC | PRN
  Administered 2024-06-25: 5 mg via ORAL
  Filled 2024-06-20: qty 1

## 2024-06-20 MED ORDER — METOPROLOL SUCCINATE ER 25 MG PO TB24: 25.0000 mg | ORAL_TABLET | Freq: Every day | ORAL | Status: DC

## 2024-06-20 MED ORDER — LACTATED RINGERS IV BOLUS
500.0000 mL | Freq: Once | INTRAVENOUS | Status: AC
  Administered 2024-06-20: 500 mL via INTRAVENOUS

## 2024-06-20 MED ORDER — APIXABAN 2.5 MG PO TABS
2.5000 mg | ORAL_TABLET | Freq: Two times a day (BID) | ORAL | Status: DC
  Administered 2024-06-20 – 2024-06-28 (×16): 2.5 mg via ORAL
  Filled 2024-06-20 (×16): qty 1

## 2024-06-20 MED ORDER — ACETAMINOPHEN 325 MG PO TABS
650.0000 mg | ORAL_TABLET | Freq: Four times a day (QID) | ORAL | Status: DC | PRN
  Administered 2024-06-23: 650 mg via ORAL
  Filled 2024-06-20: qty 2

## 2024-06-20 MED ORDER — LACTATED RINGERS IV BOLUS: 1000.0000 mL | Freq: Once | INTRAVENOUS | Status: DC

## 2024-06-20 MED ORDER — SODIUM CHLORIDE 0.9% FLUSH
3.0000 mL | Freq: Two times a day (BID) | INTRAVENOUS | Status: DC
  Administered 2024-06-20 – 2024-06-28 (×13): 3 mL via INTRAVENOUS

## 2024-06-20 MED ORDER — IOHEXOL 350 MG/ML SOLN
100.0000 mL | Freq: Once | INTRAVENOUS | Status: AC | PRN
  Administered 2024-06-20: 70 mL via INTRAVENOUS

## 2024-06-20 MED ORDER — ACETAMINOPHEN 650 MG RE SUPP: 650.0000 mg | Freq: Four times a day (QID) | RECTAL | Status: DC | PRN

## 2024-06-20 MED ORDER — METOPROLOL SUCCINATE ER 25 MG PO TB24
25.0000 mg | ORAL_TABLET | Freq: Every day | ORAL | Status: DC
  Administered 2024-06-20 – 2024-06-21 (×2): 25 mg via ORAL
  Filled 2024-06-20 (×2): qty 1

## 2024-06-20 MED ORDER — SENNOSIDES-DOCUSATE SODIUM 8.6-50 MG PO TABS: 1.0000 | ORAL_TABLET | Freq: Every evening | ORAL | Status: DC | PRN

## 2024-06-20 MED ORDER — METOPROLOL TARTRATE 5 MG/5ML IV SOLN
2.5000 mg | INTRAVENOUS | Status: AC | PRN
  Administered 2024-06-20 (×3): 2.5 mg via INTRAVENOUS
  Filled 2024-06-20 (×2): qty 5

## 2024-06-20 MED ORDER — ONDANSETRON HCL 4 MG/2ML IJ SOLN: 4.0000 mg | Freq: Four times a day (QID) | INTRAMUSCULAR | Status: DC | PRN

## 2024-06-20 NOTE — ED Provider Notes (Signed)
 [place] EMERGENCY DEPARTMENT AT [facility] Provider Note   CSN: [id number] Arrival date & time: 06/20/24  1523     History {Add pertinent medical, surgical, social history, OB history to HPI:1} Chief Complaint  Patient presents with  . Shortness of Breath    DURENDA PECHACEK is a 83 y.o. female with PMH as listed below who presents with ***.    Son reports patient is baseline short of breath but noticed increased sob today and leg swelling.  Reports she laid down for a nap and woke up hot and breathing heavy and can't catch her breath.  Retractions noted.  Patietn in afib rvr in triage   Patient was admitted from 05/18/2024 to 05/24/2024 for failure to thrive, abnormal weight loss, lower extremity edema and exertional dyspnea.  Noted to have hypercalcemia for several months.  CT chest and CT abdomen pelvis did not demonstrate any abnormalities.  No monoclonal gammopathy after evaluated by hematology.  Had possible acute on chronic diastolic CHF exacerbation as well with a mildly elevated troponin.  Lasix  was started as needed for lower extremity edema.  Also reported left ankle swelling and had a negative lower extremity Doppler.  Patient does have a pacemaker and per the discharge summary remained atrial paced throughout the hospitalization.  She does take Eliquis  and metoprolol  for A-fib.   Past Medical History:  Diagnosis Date  . A-fib (HCC)   . Allergy   . CAD (coronary artery disease)    sees Dr. Debby Sor  cardiac stents - 2000  . CHF (congestive heart failure) (HCC)   . Colon polyps   . Complication of anesthesia    rash/hives with caines  . Dyspnea    02/12/18  when my heart gets out of rhythm, it has not been out of rhythym- since I have been on Tikosyn  (11/2017)  . Dysrhythmia    afib fib  . GERD (gastroesophageal reflux disease)    takes OTC- Omeprazole - prn  . Heart murmur   . History of kidney stones   . History of stress test    show  normal perfusion without scar or ischemia, post EF 68%  . Hx of echocardiogram    show an EF 55%-60% range with grade 1 diastolic dysfunction, she had mitral anular calcification with mild MR, moderate LA dilation and mild pulmonary hypertension with a PA estimated pressure of 39mm  . Hyperlipidemia   . Hypertension   . NSTEMI (non-ST elevated myocardial infarction) (HCC)   . Osteoarthritis   . Pacemaker   . Pneumonia    hx of 2015   . PONV (postoperative nausea and vomiting)   . Sleep apnea        Home Medications Prior to Admission medications   Medication Sig Start Date End Date Taking? Authorizing Provider  acetaminophen  (TYLENOL ) 325 MG tablet Take 2 tablets (650 mg total) by mouth every 6 (six) hours as needed for mild pain (pain score 1-3) or fever (or Fever >/= 101). 05/22/24   Sebastian Toribio GAILS, MD  alum & mag hydroxide-simeth (MAALOX/MYLANTA) 200-200-20 MG/5ML suspension Take 30 mLs by mouth every 4 (four) hours as needed for indigestion. 09/30/23   Leotis Bogus, MD  apixaban  (ELIQUIS ) 2.5 MG TABS tablet Take 1 tablet (2.5 mg total) by mouth 2 (two) times daily. 01/21/24   Johnny Garnette LABOR, MD  diclofenac  Sodium (VOLTAREN ) 1 % GEL Apply 2 g topically 4 (four) times daily for 7 days, THEN 2 g 4 (four) times  daily as needed. Apply to right lower ribs. 05/24/24 08/29/24  Sebastian Toribio GAILS, MD  empagliflozin  (JARDIANCE ) 10 MG TABS tablet Take 1 tablet (10 mg total) by mouth daily. 01/21/24   Johnny Garnette LABOR, MD  furosemide  (LASIX ) 20 MG tablet Take 10 mg by mouth daily as needed (for swelling of the ankles).    [provider]  ipratropium (ATROVENT ) 0.06 % nasal spray Place 2 sprays into both nostrils 2 (two) times daily as needed (allergies). 04/28/18   [provider]  isosorbide  mononitrate (IMDUR ) 30 MG 24 hr tablet Take 1 tablet (30 mg total) by mouth daily. 02/24/24   Johnny Garnette LABOR, MD  ketoconazole  (NIZORAL ) 2 % cream Apply 1 Application topically 2 (two) times  daily as needed for irritation. 12/26/23   Johnny Garnette LABOR, MD  loratadine  (CLARITIN ) 10 MG tablet Take 10 mg by mouth daily as needed for allergies.    [provider]  megestrol  (MEGACE ) 40 MG tablet Take 1 tablet (40 mg total) by mouth in the morning. 04/30/24   Johnny Garnette LABOR, MD  methocarbamol  (ROBAXIN ) 500 MG tablet Take 1 tablet (500 mg total) by mouth every 6 (six) hours as needed for muscle spasms. 06/18/24   Johnny Garnette LABOR, MD  metoprolol  succinate (TOPROL -XL) 25 MG 24 hr tablet Take 1 tablet (25 mg total) by mouth daily. Take with or immediately following a meal. 03/11/24   Kathrin Mignon DASEN, MD  mirtazapine  (REMERON ) 15 MG tablet Take 1 tablet (15 mg total) by mouth at bedtime. 05/04/24   Johnny Garnette LABOR, MD  nitroGLYCERIN  (NITROSTAT ) 0.4 MG SL tablet Place 1 tablet (0.4 mg total) under the tongue every 5 (five) minutes as needed for chest pain. 04/06/24   Johnny Garnette LABOR, MD  Nutritional Supplements (BOOST/FIBER PO) Take 1 Bottle by mouth 3 (three) times daily.    [provider]  pantoprazole  (PROTONIX ) 40 MG tablet Take 1 tablet (40 mg total) by mouth daily. Patient taking differently: Take 40 mg by mouth daily as needed (for heartburn). 02/13/24 02/13/25  Johnny Garnette LABOR, MD  polyethylene glycol (MIRALAX  / GLYCOLAX ) 17 g packet Take 17 g by mouth 2 (two) times daily. 05/22/24   Sebastian Toribio GAILS, MD  senna-docusate (SENOKOT-S) 8.6-50 MG tablet Take 1 tablet by mouth 2 (two) times daily. 05/22/24   Sebastian Toribio GAILS, MD  triamcinolone  (NASACORT) 55 MCG/ACT nasal inhaler Place 1 spray into both nostrils daily as needed (for allergies).  10/17/12   Johnny Garnette LABOR, MD      Allergies    Lidocaine , Morphine , Procaine hcl, Sulfonamide derivatives, Amlodipine , Ezetimibe-simvastatin, Latex, Norco [hydrocodone -acetaminophen ], Tramadol , and Tape    Review of Systems   Review of Systems A 10 point review of systems was performed and is negative unless otherwise reported in HPI.  Physical  Exam Updated Vital Signs BP 133/67 (BP Location: Right Arm)   Pulse (!) 104   Temp 97.7 F (36.5 C) (Oral)   Resp 18   Ht 5' 7 (1.702 m)   Wt 51.3 kg   SpO2 97%   BMI 17.70 kg/m  Physical Exam General: Normal appearing {Desc; female/female:11659}, lying in bed.  HEENT: PERRLA, Sclera anicteric, MMM, trachea midline.  Cardiology: RRR, no murmurs/rubs/gallops. BL radial and DP pulses equal bilaterally.  Resp: Normal respiratory rate and effort. CTAB, no wheezes, rhonchi, crackles.  Abd: Soft, non-tender, non-distended. No rebound tenderness or guarding.  GU: Deferred. MSK: No peripheral edema or signs of trauma. Extremities without deformity or  TTP. No cyanosis or clubbing. Skin: warm, dry. No rashes or lesions. Back: No CVA tenderness Neuro: A&Ox4, CNs II-XII grossly intact. MAEs. Sensation grossly intact.  Psych: Normal mood and affect.   ED Results / Procedures / Treatments   Labs (all labs ordered are listed, but only abnormal results are displayed) Labs Reviewed  CBC WITH DIFFERENTIAL/PLATELET  BRAIN NATRIURETIC PEPTIDE  COMPREHENSIVE METABOLIC PANEL WITH GFR  TROPONIN I (HIGH SENSITIVITY)    EKG None  Radiology No results found.  Procedures Procedures  {Document cardiac monitor, telemetry assessment procedure when appropriate:1}  Medications Ordered in ED Medications - No data to display  ED Course/ Medical Decision Making/ A&P                          Medical Decision Making Amount and/or Complexity of Data Reviewed Labs: ordered. Radiology: ordered.    This patient presents to the ED for concern of ***, this involves an extensive number of treatment options, and is a complaint that carries with it a high risk of complications and morbidity.  I considered the following differential and admission for this acute, potentially life threatening condition.   MDM:    ***Does have a history of hypercalcemia and consider other electrolyte  derangements. Consider pacemaker failure as possible cause of her arrhythmia, as she is normally atrial paced. Her blood pressure is stable and she does not require emergent  DDX for dyspnea includes but is not limited to:  Cardiac- CHF, Myocardial Ischemia, Valvular heart disease, Arrythmia, Cardiac tamponade   Respiratory - Pneumonia / atelectasis / pulmonary effusion / cavitary lung disease, Pneumothorax, COPD/ reactive airway disease, PE    Clinical Course as of 06/20/24 1702  Sun Jun 20, 2024  1601 HR now 120s-150s bpm Afib w/ RVR [HN]  1637 Troponin I (High Sensitivity)(!!): 408 [HN]  1637 DG Chest Portable 1 View FINDINGS: LEFT-sided pacer overlies normal cardiac silhouette. Chronic elevation LEFT hemidiaphragm. No effusion, infiltrate, pneumothorax. RIGHT shoulder arthroplasty  IMPRESSION: No acute cardiopulmonary process.   [HN]    Clinical Course User Index [HN] Franklyn Sid SAILOR, MD    Labs: I Ordered, and personally interpreted labs.  The pertinent results include: Those listed above  Imaging Studies ordered: I ordered imaging studies including CXR I independently visualized and interpreted imaging. I agree with the radiologist interpretation  Additional history obtained from chart review, son at bedside.  External records from outside source obtained and reviewed including ***  Cardiac Monitoring: .The patient was maintained on a cardiac monitor.  I personally viewed and interpreted the cardiac monitored which showed an underlying rhythm of: ***  Reevaluation: After the interventions noted above, I reevaluated the patient and found that they have :{resolved/improved/worsened:23923::improved}  Social Determinants of Health: .***  Disposition:  ***  Co morbidities that complicate the patient evaluation . Past Medical History:  Diagnosis Date  . A-fib (HCC)   . Allergy   . CAD (coronary artery disease)    sees Dr. Debby Sor  cardiac stents -  2000  . CHF (congestive heart failure) (HCC)   . Colon polyps   . Complication of anesthesia    rash/hives with caines  . Dyspnea    02/12/18  when my heart gets out of rhythm, it has not been out of rhythym- since I have been on Tikosyn  (11/2017)  . Dysrhythmia    afib fib  . GERD (gastroesophageal reflux disease)    takes OTC- Omeprazole - prn  .  Heart murmur   . History of kidney stones   . History of stress test    show normal perfusion without scar or ischemia, post EF 68%  . Hx of echocardiogram    show an EF 55%-60% range with grade 1 diastolic dysfunction, she had mitral anular calcification with mild MR, moderate LA dilation and mild pulmonary hypertension with a PA estimated pressure of 39mm  . Hyperlipidemia   . Hypertension   . NSTEMI (non-ST elevated myocardial infarction) (HCC)   . Osteoarthritis   . Pacemaker   . Pneumonia    hx of 2015   . PONV (postoperative nausea and vomiting)   . Sleep apnea      Medicines No orders of the defined types were placed in this encounter.   I have reviewed the patients home medicines and have made adjustments as needed  Problem List / ED Course: Problem List Items Addressed This Visit   None        {Document critical care time when appropriate:1} {Document review of labs and clinical decision tools ie heart score, Chads2Vasc2 etc:1}  {Document your independent review of radiology images, and any outside records:1} {Document your discussion with family members, caretakers, and with consultants:1} {Document social determinants of health affecting pt's care:1} {Document your decision making why or why not admission, treatments were needed:1}  This note was created using dictation software, which may contain spelling or grammatical errors.

## 2024-06-20 NOTE — Progress Notes (Signed)
   06/20/24 2214  Assess: MEWS Score  Temp 98.1 F (36.7 C)  BP (!) 137/98  MAP (mmHg) 110  Pulse Rate (!) 118  Resp 20  SpO2 98 %  O2 Device Room Air  Assess: MEWS Score  MEWS Temp 0  MEWS Systolic 0  MEWS Pulse 2  MEWS RR 0  MEWS LOC 0  MEWS Score 2  MEWS Score Color Yellow  Assess: if the MEWS score is Yellow or Red  Were vital signs accurate and taken at a resting state? Yes  Does the patient meet 2 or more of the SIRS criteria? No  MEWS guidelines implemented  Yes, yellow  Treat  MEWS Interventions Considered administering scheduled or prn medications/treatments as ordered  Take Vital Signs  Increase Vital Sign Frequency  Yellow: Q2hr x1, continue Q4hrs until patient remains green for 12hrs  Escalate  MEWS: Escalate Yellow: Discuss with charge nurse and consider notifying provider and/or RRT  Assess: SIRS CRITERIA  SIRS Temperature  0  SIRS Respirations  0  SIRS Pulse 1  SIRS WBC 0  SIRS Score Sum  1

## 2024-06-20 NOTE — H&P (Signed)
 History and Physical    Barbara [name] FMW:[id number] DOB: [birth year] DOA: 06/20/2024  PCP: Barbara [name] LABOR, MD  Patient coming from: Home  I have personally briefly reviewed patient's old medical records in [facility]  Chief Complaint: Shortness of breath  HPI: Barbara [name] is a 83 y.o. female with medical history significant for CAD (s/p PCI LAD 2000), PAF on Eliquis , chronic HFpEF, apical HCM, SSS s/p PPM, CKD stage IIIb, chronic hypercalcemia, HTN, HLD, chronic anemia, OSA who presented to the ED for evaluation of shortness of breath.  Patient states that she has shortness of breath at baseline.  She says shortness of breath worsened today.  She woke up from a nap feeling hot with heavy breathing.  She has felt palpitations.  She denies having chest pain today but states that she did have some chest pain 4 days ago.  She says that she has been having unintentional weight loss.  ED Course  Labs/Imaging on admission: I have personally reviewed following labs and imaging studies.  Initial vitals showed BP 130/58, pulse 106, RR 18, temp 97.7 F, SpO2 97% on room air.  Labs showed troponin 408 > 398, BNP 980.3, sodium 135, potassium 3.5, bicarb 20, BUN 36, creatinine 1.33, serum glucose 118, albumin 2.9, calcium  11.2, magnesium  2.0, WBC 4.0, hemoglobin 11.7, platelets 2024.  SARS-CoV-2, influenza, RSV PCR negative.  UA pending collection.  Portable chest x-ray negative for focal consolidation, edema, effusion.  CTA chest negative for PE.  Trace right pleural effusion is unchanged compared to 05/22/2024.  Patient was given 500 cc LR and IV metoprolol  2.5 mg x 3.  EDP discussed with cardiology who recommended medical admission with cardiology consulting.  The hospitalist service was consulted for admission.  Review of Systems: All systems reviewed and are negative except as documented in history of present illness above.   Past Medical History:  Diagnosis Date    A-fib [facility])    Allergy    CAD (coronary artery disease)    sees Dr. Debby Sor  cardiac stents - 2000   CHF (congestive heart failure) (HCC)    Colon polyps    Complication of anesthesia    rash/hives with caines   Dyspnea    02/12/18  when my heart gets out of rhythm, it has not been out of rhythym- since I have been on Tikosyn  (11/2017)   Dysrhythmia    afib fib   GERD (gastroesophageal reflux disease)    takes OTC- Omeprazole - prn   Heart murmur    History of kidney stones    History of stress test    show normal perfusion without scar or ischemia, post EF 68%   Hx of echocardiogram    show an EF 55%-60% range with grade 1 diastolic dysfunction, she had mitral anular calcification with mild MR, moderate LA dilation and mild pulmonary hypertension with a PA estimated pressure of 39mm   Hyperlipidemia    Hypertension    NSTEMI (non-ST elevated myocardial infarction) (HCC)    Osteoarthritis    Pacemaker    Pneumonia    hx of 2015    PONV (postoperative nausea and vomiting)    Sleep apnea     Past Surgical History:  Procedure Laterality Date   CARDIAC CATHETERIZATION     11/2017   cardiac stents  2000   COLONOSCOPY  01-05-14   per Dr. Norleen Hint, clear, no repeats needed    COLONOSCOPY     CORONARY STENT PLACEMENT  2000   in LAD   CYSTOSCOPY WITH RETROGRADE PYELOGRAM, URETEROSCOPY AND STENT PLACEMENT Left 06/11/2022   Procedure: CYSTOSCOPY WITH RETROGRADE PYELOGRAM, LEFT STENT PLACEMENT;  Surgeon: Carolee Sherwood JONETTA DOUGLAS, MD;  Location: [facility];  Service: Urology;  Laterality: Left;   CYSTOSCOPY/URETEROSCOPY/HOLMIUM LASER/STENT PLACEMENT Left 06/25/2022   Procedure: CYSTOSCOPY LEFT URETEROSCOPY/HOLMIUM LASER/STENT PLACEMENT;  Surgeon: Watt Rush, MD;  Location: [facility];  Service: Urology;  Laterality: Left;  1 HR FOR THIS CASE   DIRECT LARYNGOSCOPY WITH RADIAESSE INJECTION N/A 02/13/2018   Procedure: DIRECT LARYNGOSCOPY WITH RADIAESSE INJECTION;  Surgeon: Carlie Clark, MD;   Location[facility] OR;  Service: ENT;  Laterality: N/A;   ESOPHAGOGASTRODUODENOSCOPY N/A 03/11/2024   Procedure: EGD (ESOPHAGOGASTRODUODENOSCOPY);  Surgeon: Rosalie Kitchens, MD;  Location: [facility] ENDOSCOPY;  Service: Gastroenterology;  Laterality: N/A;   EYE SURGERY Left    CATARACT REMOVAL   HIP ARTHROPLASTY Left 09/27/2023   Procedure: ARTHROPLASTY BIPOLAR HIP (HEMIARTHROPLASTY);  Surgeon: Beverley Evalene JONETTA, MD;  Location: [facility] OR;  Service: Orthopedics;  Laterality: Left;   KNEE ARTHROSCOPY Left 01/06/2015   Procedure: LEFT KNEE ARTHROSCOPY, abrasion chondroplasty of the medial femerol condryl,medial and lateral menisectomy, microfracture , synovectomy of the suprpatellar pouch;  Surgeon: Tanda Heading, MD;  Location: [facility];  Service: Orthopedics;  Laterality: Left;   LEFT HEART CATH AND CORONARY ANGIOGRAPHY N/A 02/26/2019   Procedure: LEFT HEART CATH AND CORONARY ANGIOGRAPHY;  Surgeon: Burnard Debby LABOR, MD;  Location: [facility] INVASIVE CV LAB;  Service: Cardiovascular;  Laterality: N/A;   LEFT HEART CATH AND CORONARY ANGIOGRAPHY N/A 05/06/2022   Procedure: LEFT HEART CATH AND CORONARY ANGIOGRAPHY;  Surgeon: Mady Bruckner, MD;  Location: [facility] INV[facility] CV LAB;  Service: Cardiovascular;  Laterality: N/A;   MICROLARYNGOSCOPY W/VOCAL CORD INJECTION N/A 08/07/2018   Procedure: MICROLARYNGOSCOPY WITH VOCAL CORD INJECTION OF PROLARYN;  Surgeon: Carlie Clark, MD;  Location: [facility] OR;  Service: ENT;  Laterality: N/A;  JET VENTILATION   PACEMAKER IMPLANT N/A 05/08/2022   Procedure: PACEMAKER IMPLANT;  Surgeon: Inocencio Soyla Lunger, MD;  Location: [facility] INVAS[facility] CV LAB;  Service: Cardiovascular;  Laterality: N/A;   REVERSE SHOULDER ARTHROPLASTY Right 03/23/2020   Procedure: REVERSE SHOULDER ARTHROPLASTY;  Surgeon: Sharl Selinda Dover, MD;  Location: [facility] OR;  Service: Orthopedics;  Laterality: Right;   VAGINAL HYSTERECTOMY  1971    Social History: Social History   Tobacco Use   Smoking status: Former    Current packs/day: 0.00     Types: Cigarettes    Start date: 11/04/1956    Quit date: 11/04/1996    Years since quitting: 27.6   Smokeless tobacco: Never  Vaping Use   Vaping status: Never Used  Substance Use Topics   Alcohol use: No    Alcohol/week: 0.0 standard drinks of alcohol   Drug use: No   Allergies  Allergen Reactions   Lidocaine  Anaphylaxis   Morphine  Other (See Comments)    Body shuts down   Procaine Hcl Anaphylaxis, Rash and Other (See Comments)    Anything with 'caine' in it    Sulfonamide Derivatives Hives   Amlodipine  Swelling   Ezetimibe-Simvastatin Other (See Comments)    VYTORIN = Joint pain   Latex Itching and Other (See Comments)    WELTS   Norco [Hydrocodone -Acetaminophen ] Nausea And Vomiting and Other (See Comments)    States does ok with IV form    Tramadol  Nausea And Vomiting   Tape Itching and Other (See Comments)    Patient prefers either paper tape or Coban wrap  Family History  Problem Relation Age of Onset   Cancer Maternal Grandmother    Heart disease Maternal Grandfather      Prior to Admission medications   Medication Sig Start Date End Date Taking? Authorizing Provider  acetaminophen  (TYLENOL ) 325 MG tablet Take 2 tablets (650 mg total) by mouth every 6 (six) hours as needed for mild pain (pain score 1-3) or fever (or Fever >/= 101). 05/22/24   Sebastian Toribio GAILS, MD  alum & mag hydroxide-simeth (MAALOX/MYLANTA) 200-200-20 MG/5ML suspension Take 30 mLs by mouth every 4 (four) hours as needed for indigestion. 09/30/23   Leotis Bogus, MD  apixaban  (ELIQUIS ) 2.5 MG TABS tablet Take 1 tablet (2.5 mg total) by mouth 2 (two) times daily. 01/21/24   Barbara [name] LABOR, MD  diclofenac  Sodium (VOLTAREN ) 1 % GEL Apply 2 g topically 4 (four) times daily for 7 days, THEN 2 g 4 (four) times daily as needed. Apply to right lower ribs. 05/24/24 08/29/24  Sebastian Toribio GAILS, MD  empagliflozin  (JARDIANCE ) 10 MG TABS tablet Take 1 tablet (10 mg total) by mouth daily. 01/21/24   Barbara [name] LABOR, MD  furosemide  (LASIX ) 20 MG tablet Take 10 mg by mouth daily as needed (for swelling of the ankles).    [provider]  ipratropium (ATROVENT ) 0.06 % nasal spray Place 2 sprays into both nostrils 2 (two) times daily as needed (allergies). 04/28/18   [provider]  isosorbide  mononitrate (IMDUR ) 30 MG 24 hr tablet Take 1 tablet (30 mg total) by mouth daily. 02/24/24   Barbara [name] LABOR, MD  ketoconazole  (NIZORAL ) 2 % cream Apply 1 Application topically 2 (two) times daily as needed for irritation. 12/26/23   Barbara [name] LABOR, MD  loratadine  (CLARITIN ) 10 MG tablet Take 10 mg by mouth daily as needed for allergies.    [provider]  megestrol  (MEGACE ) 40 MG tablet Take 1 tablet (40 mg total) by mouth in the morning. 04/30/24   Barbara [name] LABOR, MD  methocarbamol  (ROBAXIN ) 500 MG tablet Take 1 tablet (500 mg total) by mouth every 6 (six) hours as needed for muscle spasms. 06/18/24   Barbara [name] LABOR, MD  metoprolol  succinate (TOPROL -XL) 25 MG 24 hr tablet Take 1 tablet (25 mg total) by mouth daily. Take with or immediately following a meal. 03/11/24   Gonfa, Taye T, MD  mirtazapine  (REMERON ) 15 MG tablet Take 1 tablet (15 mg total) by mouth at bedtime. 05/04/24   Barbara [name] LABOR, MD  nitroGLYCERIN  (NITROSTAT ) 0.4 MG SL tablet Place 1 tablet (0.4 mg total) under the tongue every 5 (five) minutes as needed for chest pain. 04/06/24   Barbara [name] LABOR, MD  Nutritional Supplements (BOOST/FIBER PO) Take 1 Bottle by mouth 3 (three) times daily.    [provider]  pantoprazole  (PROTONIX ) 40 MG tablet Take 1 tablet (40 mg total) by mouth daily. Patient taking differently: Take 40 mg by mouth daily as needed (for heartburn). 02/13/24 02/13/25  Barbara [name] LABOR, MD  polyethylene glycol (MIRALAX  / GLYCOLAX ) 17 g packet Take 17 g by mouth 2 (two) times daily. 05/22/24   Sebastian Toribio GAILS, MD  senna-docusate (SENOKOT-S) 8.6-50 MG tablet Take 1 tablet by mouth 2 (two) times daily. 05/22/24    Sebastian Toribio GAILS, MD  triamcinolone  (NASACORT) 55 MCG/ACT nasal inhaler Place 1 spray into both nostrils daily as needed (for allergies).  10/17/12   Barbara [name] LABOR, MD    Physical Exam: Vitals:   06/20/24 2125 06/20/24  2126 06/20/24 2145 06/20/24 2214  BP:    (!) 137/98  Pulse: 78 89 98 (!) 118  Resp: (!) 21 19 20 20   Temp:    98.1 F (36.7 C)  TempSrc:    Oral  SpO2: 97% 96% 96% 98%  Weight:    58 kg  Height:    5' 8 (1.727 m)   Constitutional: Resting supine in bed, NAD, calm, comfortable Eyes: EOMI, lids and conjunctivae normal ENMT: Mucous membranes are moist. Posterior pharynx clear of any exudate or lesions.Normal dentition.  Neck: normal, supple, no masses. Respiratory: clear to auscultation bilaterally, no wheezing, no crackles. Normal respiratory effort. No accessory muscle use.  Cardiovascular: Irregularly irregular, no murmurs / rubs / gallops. No extremity edema. 2+ pedal pulses.  PPM in place left upper chest. Abdomen: no tenderness, no masses palpated. Musculoskeletal: no clubbing / cyanosis. No joint deformity upper and lower extremities. Good ROM, no contractures. Normal muscle tone.  Skin: no rashes, lesions, ulcers. No induration Neurologic: Sensation intact. Strength 5/5 in all 4.  Psychiatric: Normal judgment and insight. Alert and oriented x 3. Normal mood.   EKG: Personally reviewed.  A-fib RVR, rate 162. Previous EKG showed atrial paced rhythm.  Assessment/Plan Principal Problem:   Paroxysmal atrial fibrillation with rapid ventricular response (HCC) Active Problems:   CAD (coronary artery disease/elevated troponin   Essential hypertension   Chronic kidney disease, stage 3b (HCC)   Hyperlipidemia   OSA (obstructive sleep apnea)   Chronic heart failure with preserved ejection fraction (HFpEF) (HCC)   Hypercalcemia   Barbara [name] is a 83 y.o. female with medical history significant for CAD (s/p PCI LAD 2000), PAF on Eliquis , chronic HFpEF,  apical HCM, SSS s/p PPM, CKD stage IIIb, chronic hypercalcemia, HTN, HLD, chronic anemia, OSA who is admitted with atrial fibrillation with RVR.  Assessment and Plan: Paroxysmal atrial fibrillation with RVR SSS s/p PPM: Patient presenting with A-fib RVR, rate 160s.  HR is improving with IV metoprolol  push. - Continue home Toprol -XL 25 mg daily - Continue Eliquis  - Pacemaker interrogation - Cardiology to consult in a.m.  Chronic HFpEF: TTE 05/18/2024 showed systolic LV mid cavity obliteration with apical entrapment, no LV outflow obstruction.  EF >75% with hyperdynamic function.  No RWMA.  Severe asymmetric apical LVH, G2DD. - Continue Toprol -XL - No need for diuresis at this time  Coronary artery disease: History of PCI to LAD in 2000.  Troponin elevated 408 > 398.  Patient reports chest pain several days prior to arrival but none at time of admission.  Could be demand ischemia in setting of A-fib RVR.  LHC 05/06/2022 showed mild-moderate nonobstructive CAD and 30% in-stent restenosis of LAD stent. - Cardiology consult - Continue Toprol -XL and Imdur  - Continue Eliquis   Chronic hypercalcemia: Patient with persistent hypercalcemia of unknown etiology.  Has had extensive prior workup.  Imaging including chest, abdomen, pelvis have been unrevealing.  She had negative workup for monoclonal gammopathy.  X-ray skeletal survey did not show evidence of bone lytic lesions.  PTH related peptide was normal.  May be related to CKD.  Continue to monitor.  CKD stage IIIb: Renal function stable.  Hypertension: Continue Toprol -XL and Imdur .  Hyperlipidemia: Does not appear to be on statin as an outpatient.  OSA: Continue CPAP nightly.   DVT prophylaxis: apixaban  (ELIQUIS ) tablet 2.5 mg Start: 06/20/24 2315 apixaban  (ELIQUIS ) tablet 2.5 mg   Code Status: Full code, discussed with patient on admission Family Communication: Discussed with patient, she has discussed  with family Disposition Plan:  From home, dispo pending clinical progress Consults called: Cardiology Severity of Illness: The appropriate patient status for this patient is OBSERVATION. Observation status is judged to be reasonable and necessary in order to provide the required intensity of service to ensure the patient's safety. The patient's presenting symptoms, physical exam findings, and initial radiographic and laboratory data in the context of their medical condition is felt to place them at decreased risk for further clinical deterioration. Furthermore, it is anticipated that the patient will be medically stable for discharge from the hospital within 2 midnights of admission.   Jorie Blanch MD [facility]  If 7PM-7AM, please contact night-coverage [email or web address]  06/20/2024, 10:32 PM

## 2024-06-20 NOTE — ED Notes (Signed)
 Patient resting in bed.

## 2024-06-20 NOTE — Progress Notes (Deleted)
   06/20/24 1849  BiPAP/CPAP/SIPAP  $ Non-Invasive Ventilator  Non-Invasive Vent Set Up;Non-Invasive Vent Initial  $ Face Mask Small Yes  BiPAP/CPAP/SIPAP Pt Type Adult  BiPAP/CPAP/SIPAP (S)  V60  Mask Type Full face mask  Dentures removed? Not applicable (none in mouth)  Mask Size (S)  Small  Set Rate (S)  18 breaths/min  Respiratory Rate 30 breaths/min  IPAP (S)  10 cmH20  EPAP (S)  5 cmH2O  FiO2 (%) (S)  28 %  Flow Rate 1 lpm (Rise 3)  Minute Ventilation 9.4  Leak 28  Peak Inspiratory Pressure (PIP) 11  Tidal Volume (Vt) 317  Patient Home Machine No  Patient Home Mask No  Patient Home Tubing No  Auto Titrate No  Press High Alarm 30 cmH2O  Press Low Alarm 5 cmH2O  Nasal massage performed No (comment)  CPAP/SIPAP surface wiped down Yes  Device Plugged into RED Power Outlet Yes  BiPAP/CPAP /SiPAP Vitals  Pulse Rate (!) 118  Resp 18  BP 114/64  SpO2 96 %  Bilateral Breath Sounds Diminished;Fine crackles  MEWS Score/Color  MEWS Score 2  MEWS Score Color Yellow

## 2024-06-20 NOTE — Hospital Course (Signed)
 Barbara [name] is a 83 y.o. female with medical history significant for CAD (s/p PCI LAD 2000), PAF on Eliquis , chronic HFpEF, apical HCM, SSS s/p PPM, CKD stage IIIb, chronic hypercalcemia, HTN, HLD, chronic anemia, OSA who is admitted with atrial fibrillation with RVR.

## 2024-06-20 NOTE — ED Triage Notes (Signed)
 Son reports patient is baseline short of breath but noticed increased sob today and leg swelling.  Reports she laid down for a nap and woke up hot and breathing heavy and can't catch her breath.  Retractions noted.  Patietn in afib rvr in triage

## 2024-06-20 NOTE — ED Notes (Signed)
 Patient resting in bed with family at bedside.

## 2024-06-20 NOTE — ED Notes (Signed)
 ED TO INPATIENT HANDOFF REPORT  Name/Age/Gender Barbara [name] 83 y.o. female  Code Status Code Status History     Date Active Date Inactive Code Status Order ID Comments User Context   05/18/2024 1327 05/24/2024 1958 Full Code [id number]  Celinda Alm Lot, MD ED   03/09/2024 1941 03/11/2024 1902 Full Code [id number]  Tobie Mario GAILS, MD ED   09/26/2023 1459 09/30/2023 2028 Full Code [id number]  Leotis Bogus, MD ED   08/04/2022 2151 08/08/2022 1626 Full Code [id number]  Alfornia Madison, MD Inpatient   06/09/2022 1537 06/14/2022 1833 Full Code [id number]  Waddell Rake, MD ED   05/08/2022 1705 05/10/2022 1741 Full Code [id number]  Leverne Charlies Helling, PA-C Inpatient   05/03/2022 2205 05/08/2022 1705 Full Code [id number]  Emilio Norleen BIRCH, PA-C Inpatient   05/03/2022 1603 05/03/2022 2205 Full Code [id number]  Vernon Velna SAUNDERS, MD ED   10/08/2020 0031 10/12/2020 1959 Full Code [id number]  Valentin Skates, DO Inpatient   05/17/2020 1807 05/20/2020 2044 Full Code [id number]  Janene Boer, PA ED   03/23/2020 1129 03/24/2020 2205 Full Code [id number]  Sharl Selinda Dover, MD Inpatient   02/25/2019 1542 02/28/2019 2006 Full Code [id number]  Arvil Kate BIRCH, NP Inpatient   11/20/2017 1032 11/24/2017 2111 Full Code [id number]  Kathrine Jeoffrey POUR, RN Inpatient   11/16/2017 2120 11/20/2017 1032 Full Code [id number]  Renato Curtistine Mungo, MD ED    Questions for Most Recent Historical Code Status (Order [id number])     Question Answer   By: Consent: discussion documented in EHR            Home/SNF/Other Home  Chief Complaint Paroxysmal atrial fibrillation with rapid ventricular response (HCC) [I48.0]  Level of Care/Admitting Diagnosis ED Disposition     ED Disposition  Admit   Condition  --   Comment  Hospital Area: [facility] [[id number]]  Level of Care: Progressive [102]  Admit to Progressive based on following criteria: CARDIOVASCULAR & THORACIC of moderate stability with acute coronary  syndrome symptoms/low risk myocardial infarction/hypertensive urgency/arrhythmias/heart failure potentially compromising stability and stable post cardiovascular intervention patients.  May place patient in observation at [facility] or [facility] if equivalent level of care is available:: No  Covid Evaluation: Asymptomatic - no recent exposure (last 10 days) testing not required  Diagnosis: Paroxysmal atrial fibrillation with rapid ventricular response [facility]) [[id number]]  Admitting Physician: TOBIE JORIE SAUNDERS [[id number]]  Attending Physician: TOBIE JORIE SAUNDERS [[id number]]  For patients discharging to extended facilities (i.e. SNF, AL, group homes or LTAC) initiate:: Discharge to SNF/Facility Placement COVID-19 Lab Testing Protocol          Medical History Past Medical History:  Diagnosis Date   A-fib [facility])    Allergy    CAD (coronary artery disease)    sees Dr. Debby Sor  cardiac stents - 2000   CHF (congestive heart failure) (HCC)    Colon polyps    Complication of anesthesia    rash/hives with caines   Dyspnea    02/12/18  when my heart gets out of rhythm, it has not been out of rhythym- since I have been on Tikosyn  (11/2017)   Dysrhythmia    afib fib   GERD (gastroesophageal reflux disease)    takes OTC- Omeprazole - prn   Heart murmur    History of kidney stones    History of stress test    show normal perfusion without scar or ischemia, post EF 68%   Hx of echocardiogram  show an EF 55%-60% range with grade 1 diastolic dysfunction, she had mitral anular calcification with mild MR, moderate LA dilation and mild pulmonary hypertension with a PA estimated pressure of 39mm   Hyperlipidemia    Hypertension    NSTEMI (non-ST elevated myocardial infarction) (HCC)    Osteoarthritis    Pacemaker    Pneumonia    hx of 2015    PONV (postoperative nausea and vomiting)    Sleep apnea     Allergies Allergies  Allergen Reactions   Lidocaine  Anaphylaxis   Morphine  Other (See  Comments)    Body shuts down   Procaine Hcl Anaphylaxis, Rash and Other (See Comments)    Anything with 'caine' in it    Sulfonamide Derivatives Hives   Amlodipine  Swelling   Ezetimibe-Simvastatin Other (See Comments)    VYTORIN = Joint pain   Latex Itching and Other (See Comments)    WELTS   Norco [Hydrocodone -Acetaminophen ] Nausea And Vomiting and Other (See Comments)    States does ok with IV form    Tramadol  Nausea And Vomiting   Tape Itching and Other (See Comments)    Patient prefers either paper tape or Coban wrap    IV Location/Drains/Wounds Patient Lines/Drains/Airways Status     Active Line/Drains/Airways     Name Placement date Placement time Site Days   Peripheral IV 06/20/24 20 G Anterior;Distal;Right Forearm 06/20/24  1543  Forearm  less than 1   Peripheral IV 06/20/24 20 G Left Antecubital 06/20/24  1543  Antecubital  less than 1   Ureteral Drain/Stent Left ureter 6 Fr. 06/25/22  1423  Left ureter  726            Labs/Imaging Results for orders placed or performed during the hospital encounter of 06/20/24 (from the past 48 hours)  CBC with Differential     Status: Abnormal   Collection Time: 06/20/24  3:38 PM  Result Value Ref Range   WBC 4.0 4.0 - 10.5 K/uL   RBC 4.21 3.87 - 5.11 MIL/uL   Hemoglobin 11.7 (L) 12.0 - 15.0 g/dL   HCT 62.2 63.9 - 53.9 %   MCV 89.5 80.0 - 100.0 fL   MCH 27.8 26.0 - 34.0 pg   MCHC 31.0 30.0 - 36.0 g/dL   RDW 85.3 88.4 - 84.4 %   Platelets 224 150 - 400 K/uL   nRBC 0.0 0.0 - 0.2 %   Neutrophils Relative % 78 %   Neutro Abs 3.1 1.7 - 7.7 K/uL   Lymphocytes Relative 14 %   Lymphs Abs 0.6 (L) 0.7 - 4.0 K/uL   Monocytes Relative 5 %   Monocytes Absolute 0.2 0.1 - 1.0 K/uL   Eosinophils Relative 3 %   Eosinophils Absolute 0.1 0.0 - 0.5 K/uL   Basophils Relative 0 %   Basophils Absolute 0.0 0.0 - 0.1 K/uL   Immature Granulocytes 0 %   Abs Immature Granulocytes 0.01 0.00 - 0.07 K/uL    Comment: Performed at [facility], [address], [place], [place] [address]  Brain natriuretic peptide     Status: Abnormal   Collection Time: 06/20/24  3:38 PM  Result Value Ref Range   B Natriuretic Peptide 980.3 (H) 0.0 - 100.0 pg/mL    Comment: Performed at [facility], [address] [place], [place] [address]  Comprehensive metabolic panel     Status: Abnormal   Collection Time: 06/20/24  3:38 PM  Result Value Ref Range  Sodium 135 135 - 145 mmol/L   Potassium 3.5 3.5 - 5.1 mmol/L   Chloride 103 98 - 111 mmol/L   CO2 20 (L) 22 - 32 mmol/L   Glucose, Bld 118 (H) 70 - 99 mg/dL    Comment: Glucose reference range applies only to samples taken after fasting for at least 8 hours.   BUN 36 (H) 8 - 23 mg/dL   Creatinine, Ser 8.66 (H) 0.44 - 1.00 mg/dL   Calcium  11.2 (H) 8.9 - 10.3 mg/dL   Total Protein 8.2 (H) 6.5 - 8.1 g/dL   Albumin 2.9 (L) 3.5 - 5.0 g/dL   AST 43 (H) 15 - 41 U/L   ALT 15 0 - 44 U/L   Alkaline Phosphatase 116 38 - 126 U/L   Total Bilirubin 0.7 0.0 - 1.2 mg/dL   GFR, Estimated 39 (L) >60 mL/min    Comment: (NOTE) Calculated using the CKD-EPI Creatinine Equation (2021)    Anion gap 12 5 - 15    Comment: Performed at [facility], [address], [place], [place] [address]  Troponin I (High Sensitivity)     Status: Abnormal   Collection Time: 06/20/24  3:38 PM  Result Value Ref Range   Troponin I (High Sensitivity) 408 (HH) <18 ng/L    Comment: CRITICAL RESULT CALLED TO, READ BACK BY AND VERIFIED WITH RIMANDO,J. RN AT 1630 06/20/24 MULLINS,T (NOTE) Elevated high sensitivity troponin I (hsTnI) values and significant  changes across serial measurements may suggest ACS but many other  chronic and acute conditions are known to elevate hsTnI results.  Refer to the Links section for chest pain algorithms and additional  guidance. Performed at [facility], [address], [place], [place] [address]    Magnesium      Status: None   Collection Time: 06/20/24  3:38 PM  Result Value Ref Range   Magnesium  2.0 1.7 - 2.4 mg/dL    Comment: Performed at [facility], [address], [place], [place] [address]  Resp panel by RT-PCR (RSV, Flu A&B, Covid) Anterior Nasal Swab     Status: None   Collection Time: 06/20/24  4:12 PM   Specimen: Anterior Nasal Swab  Result Value Ref Range   SARS Coronavirus 2 by RT PCR NEGATIVE NEGATIVE    Comment: (NOTE) SARS-CoV-2 target nucleic acids are NOT DETECTED.  The SARS-CoV-2 RNA is generally detectable in upper respiratory specimens during the acute phase of infection. The lowest concentration of SARS-CoV-2 viral copies this assay can detect is 138 copies/mL. A negative result does not preclude SARS-Cov-2 infection and should not be used as the sole basis for treatment or other patient management decisions. A negative result may occur with  improper specimen collection/handling, submission of specimen other than nasopharyngeal swab, presence of viral mutation(s) within the areas targeted by this assay, and inadequate number of viral copies(<138 copies/mL). A negative result must be combined with clinical observations, patient history, and epidemiological information. The expected result is Negative.  Fact Sheet for Patients:  BloggerCourse.com  Fact Sheet for Healthcare Providers:  SeriousBroker.it  This test is no t yet approved or cleared by the [place]  FDA and  has been authorized for detection and/or diagnosis of SARS-CoV-2 by FDA under an Emergency Use Authorization (EUA). This EUA will remain  in effect (meaning this test can be used) for the duration of the COVID-19 declaration under Section 564(b)(1) of the Act, 21 U.S.C.section 360bbb-3(b)(1), unless the authorization is terminated  or revoked sooner.       Influenza A by PCR NEGATIVE NEGATIVE   Influenza B by  PCR NEGATIVE NEGATIVE    Comment: (NOTE) The Xpert Xpress SARS-CoV-2/FLU/RSV plus assay is intended as an aid in the diagnosis of influenza from Nasopharyngeal swab specimens and should not be used as a sole basis for treatment. Nasal washings and aspirates are unacceptable for Xpert Xpress SARS-CoV-2/FLU/RSV testing.  Fact Sheet for Patients: BloggerCourse.com  Fact Sheet for Healthcare Providers: SeriousBroker.it  This test is not yet approved or cleared by the [place]  FDA and has been authorized for detection and/or diagnosis of SARS-CoV-2 by FDA under an Emergency Use Authorization (EUA). This EUA will remain in effect (meaning this test can be used) for the duration of the COVID-19 declaration under Section 564(b)(1) of the Act, 21 U.S.C. section 360bbb-3(b)(1), unless the authorization is terminated or revoked.     Resp Syncytial Virus by PCR NEGATIVE NEGATIVE    Comment: (NOTE) Fact Sheet for Patients: BloggerCourse.com  Fact Sheet for Healthcare Providers: SeriousBroker.it  This test is not yet approved or cleared by the [place]  FDA and has been authorized for detection and/or diagnosis of SARS-CoV-2 by FDA under an Emergency Use Authorization (EUA). This EUA will remain in effect (meaning this test can be used) for the duration of the COVID-19 declaration under Section 564(b)(1) of the Act, 21 U.S.C. section 360bbb-3(b)(1), unless the authorization is terminated or revoked.  Performed at [facility], [address] [place], [place] [address]   Troponin I (High Sensitivity)     Status: Abnormal   Collection Time: 06/20/24  5:34 PM  Result Value Ref Range   Troponin I (High Sensitivity) 398 (HH) <18 ng/L    Comment: CRITICAL RESULT CALLED TO, READ BACK BY AND VERIFIED WITH GODER, E. RN AT 1829 06/20/2024 BY JEREMY C (NOTE) Elevated  high sensitivity troponin I (hsTnI) values and significant  changes across serial measurements may suggest ACS but many other  chronic and acute conditions are known to elevate hsTnI results.  Refer to the Links section for chest pain algorithms and additional  guidance. Performed at [facility], [address] [place], [place] [address]    CT Angio Chest PE W and/or Wo Contrast Result Date: 06/20/2024 CLINICAL DATA:  Baseline short of breath. Increased shortness of breath today with leg swelling. EXAM: CT ANGIOGRAPHY CHEST WITH CONTRAST TECHNIQUE: Multidetector CT imaging of the chest was performed using the standard protocol during bolus administration of intravenous contrast. Multiplanar CT image reconstructions and MIPs were obtained to evaluate the vascular anatomy. RADIATION DOSE REDUCTION: This exam was performed according to the departmental dose-optimization program which includes automated exposure control, adjustment of the mA and/or kV according to patient size and/or use of iterative reconstruction technique. CONTRAST:  70mL OMNIPAQUE  IOHEXOL  350 MG/ML SOLN COMPARISON:  Same day chest radiograph and CT chest 05/22/2024 FINDINGS: Cardiovascular: Negative for pulmonary embolism. Normal caliber thoracic aorta. Coronary artery and aortic atherosclerotic calcification. No pericardial effusion. Left chest wall pacemaker. Mediastinum/Nodes: Trachea and esophagus are unremarkable. No pathologic adenopathy. Lungs/Pleura: Biapical pleuroparenchymal scarring and atelectasis in the lower lobes. Trace right pleural effusion. No pneumothorax. Upper Abdomen: No acute abnormality. Musculoskeletal: No acute fracture.  Right reverse TSA. Review of the MIP images confirms the above findings. IMPRESSION: 1. Negative for pulmonary embolism. 2. Trace right pleural effusion, unchanged from 05/22/2024. 3. Aortic Atherosclerosis (ICD10-I70.0). Electronically Signed   By: Norman Gatlin M.D.    On:  06/20/2024 21:26   DG Chest Portable 1 View Result Date: 06/20/2024 CLINICAL DATA:  Short of breath EXAM: PORTABLE CHEST 1 VIEW COMPARISON:  None Available. FINDINGS: LEFT-sided pacer overlies normal cardiac silhouette. Chronic elevation LEFT hemidiaphragm. No effusion, infiltrate, pneumothorax. RIGHT shoulder arthroplasty IMPRESSION: No acute cardiopulmonary process. Electronically Signed   By: Jackquline Boxer M.D.   On: 06/20/2024 16:27    Pending Labs Unresulted Labs (From admission, onward)     Start     Ordered   06/20/24 1603  Calcium , ionized  Once,   STAT        06/20/24 1602   06/20/24 1546  Urinalysis, Routine w reflex microscopic -Urine, Clean Catch  Once,   URGENT       Question:  Specimen Source  Answer:  Urine, Clean Catch   06/20/24 1545            Vitals/Pain Today's Vitals   06/20/24 2123 06/20/24 2124 06/20/24 2125 06/20/24 2126  BP:      Pulse: 69 74 78 89  Resp: (!) 21 16 (!) 21 19  Temp:      TempSrc:      SpO2: 90% 97% 97% 96%  Weight:      Height:      PainSc:        Isolation Precautions No active isolations  Medications Medications  lactated ringers  bolus 500 mL (0 mLs Intravenous Stopped 06/20/24 1734)  metoprolol  tartrate (LOPRESSOR ) injection 2.5 mg (2.5 mg Intravenous Given 06/20/24 2104)  iohexol  (OMNIPAQUE ) 350 MG/ML injection 100 mL (70 mLs Intravenous Contrast Given 06/20/24 2056)    Mobility non-ambulatory

## 2024-06-21 ENCOUNTER — Telehealth: Payer: Self-pay

## 2024-06-21 ENCOUNTER — Ambulatory Visit: Payer: Self-pay | Admitting: Family Medicine

## 2024-06-21 DIAGNOSIS — I5032 Chronic diastolic (congestive) heart failure: Secondary | ICD-10-CM | POA: Diagnosis not present

## 2024-06-21 DIAGNOSIS — I509 Heart failure, unspecified: Secondary | ICD-10-CM

## 2024-06-21 DIAGNOSIS — D631 Anemia in chronic kidney disease: Secondary | ICD-10-CM | POA: Diagnosis not present

## 2024-06-21 DIAGNOSIS — Z7401 Bed confinement status: Secondary | ICD-10-CM | POA: Diagnosis not present

## 2024-06-21 DIAGNOSIS — Z950 Presence of cardiac pacemaker: Secondary | ICD-10-CM | POA: Diagnosis not present

## 2024-06-21 DIAGNOSIS — I4891 Unspecified atrial fibrillation: Secondary | ICD-10-CM | POA: Diagnosis not present

## 2024-06-21 DIAGNOSIS — G4733 Obstructive sleep apnea (adult) (pediatric): Secondary | ICD-10-CM | POA: Diagnosis not present

## 2024-06-21 DIAGNOSIS — I422 Other hypertrophic cardiomyopathy: Secondary | ICD-10-CM

## 2024-06-21 DIAGNOSIS — R0602 Shortness of breath: Secondary | ICD-10-CM | POA: Diagnosis not present

## 2024-06-21 DIAGNOSIS — Z1152 Encounter for screening for COVID-19: Secondary | ICD-10-CM | POA: Diagnosis not present

## 2024-06-21 DIAGNOSIS — I130 Hypertensive heart and chronic kidney disease with heart failure and stage 1 through stage 4 chronic kidney disease, or unspecified chronic kidney disease: Secondary | ICD-10-CM | POA: Diagnosis not present

## 2024-06-21 DIAGNOSIS — N1832 Chronic kidney disease, stage 3b: Secondary | ICD-10-CM | POA: Diagnosis not present

## 2024-06-21 DIAGNOSIS — Z7984 Long term (current) use of oral hypoglycemic drugs: Secondary | ICD-10-CM | POA: Diagnosis not present

## 2024-06-21 DIAGNOSIS — I272 Pulmonary hypertension, unspecified: Secondary | ICD-10-CM | POA: Diagnosis not present

## 2024-06-21 DIAGNOSIS — R64 Cachexia: Secondary | ICD-10-CM | POA: Diagnosis not present

## 2024-06-21 DIAGNOSIS — R627 Adult failure to thrive: Secondary | ICD-10-CM | POA: Diagnosis not present

## 2024-06-21 DIAGNOSIS — I421 Obstructive hypertrophic cardiomyopathy: Secondary | ICD-10-CM | POA: Diagnosis not present

## 2024-06-21 DIAGNOSIS — E785 Hyperlipidemia, unspecified: Secondary | ICD-10-CM | POA: Diagnosis not present

## 2024-06-21 DIAGNOSIS — I480 Paroxysmal atrial fibrillation: Secondary | ICD-10-CM | POA: Diagnosis not present

## 2024-06-21 DIAGNOSIS — E440 Moderate protein-calorie malnutrition: Secondary | ICD-10-CM | POA: Diagnosis not present

## 2024-06-21 DIAGNOSIS — E1122 Type 2 diabetes mellitus with diabetic chronic kidney disease: Secondary | ICD-10-CM | POA: Diagnosis not present

## 2024-06-21 DIAGNOSIS — J90 Pleural effusion, not elsewhere classified: Secondary | ICD-10-CM | POA: Diagnosis not present

## 2024-06-21 DIAGNOSIS — I2489 Other forms of acute ischemic heart disease: Secondary | ICD-10-CM | POA: Diagnosis not present

## 2024-06-21 DIAGNOSIS — R531 Weakness: Secondary | ICD-10-CM | POA: Diagnosis not present

## 2024-06-21 DIAGNOSIS — I4819 Other persistent atrial fibrillation: Secondary | ICD-10-CM | POA: Diagnosis not present

## 2024-06-21 DIAGNOSIS — Z7901 Long term (current) use of anticoagulants: Secondary | ICD-10-CM | POA: Diagnosis not present

## 2024-06-21 DIAGNOSIS — Z681 Body mass index (BMI) 19 or less, adult: Secondary | ICD-10-CM | POA: Diagnosis not present

## 2024-06-21 DIAGNOSIS — I252 Old myocardial infarction: Secondary | ICD-10-CM | POA: Diagnosis not present

## 2024-06-21 DIAGNOSIS — Z96611 Presence of right artificial shoulder joint: Secondary | ICD-10-CM | POA: Diagnosis not present

## 2024-06-21 DIAGNOSIS — E8809 Other disorders of plasma-protein metabolism, not elsewhere classified: Secondary | ICD-10-CM | POA: Diagnosis not present

## 2024-06-21 DIAGNOSIS — I495 Sick sinus syndrome: Secondary | ICD-10-CM | POA: Diagnosis not present

## 2024-06-21 DIAGNOSIS — I2510 Atherosclerotic heart disease of native coronary artery without angina pectoris: Secondary | ICD-10-CM | POA: Diagnosis not present

## 2024-06-21 DIAGNOSIS — R9389 Abnormal findings on diagnostic imaging of other specified body structures: Secondary | ICD-10-CM | POA: Diagnosis not present

## 2024-06-21 LAB — RENAL FUNCTION PANEL
Albumin: 2.5 g/dL — ABNORMAL LOW (ref 3.5–5.0)
Anion gap: 7 (ref 5–15)
BUN: 29 mg/dL — ABNORMAL HIGH (ref 8–23)
CO2: 23 mmol/L (ref 22–32)
Calcium: 10.6 mg/dL — ABNORMAL HIGH (ref 8.9–10.3)
Chloride: 109 mmol/L (ref 98–111)
Creatinine, Ser: 1.3 mg/dL — ABNORMAL HIGH (ref 0.44–1.00)
GFR, Estimated: 40 mL/min — ABNORMAL LOW (ref >60)
Glucose, Bld: 92 mg/dL (ref 70–99)
Phosphorus: 2.7 mg/dL (ref 2.5–4.6)
Potassium: 3.5 mmol/L (ref 3.5–5.1)
Sodium: 139 mmol/L (ref 135–145)

## 2024-06-21 LAB — T4, FREE: Free T4: 1.24 ng/dL — ABNORMAL HIGH (ref 0.61–1.12)

## 2024-06-21 LAB — CBC
HCT: 32.4 % — ABNORMAL LOW (ref 36.0–46.0)
Hemoglobin: 10.1 g/dL — ABNORMAL LOW (ref 12.0–15.0)
MCH: 28.3 pg (ref 26.0–34.0)
MCHC: 31.2 g/dL (ref 30.0–36.0)
MCV: 90.8 fL (ref 80.0–100.0)
Platelets: 197 K/uL (ref 150–400)
RBC: 3.57 MIL/uL — ABNORMAL LOW (ref 3.87–5.11)
RDW: 14.7 % (ref 11.5–15.5)
WBC: 3.4 K/uL — ABNORMAL LOW (ref 4.0–10.5)
nRBC: 0 % (ref 0.0–0.2)

## 2024-06-21 LAB — CALCIUM, IONIZED: Calcium, Ionized, Serum: 6.5 mg/dL — ABNORMAL HIGH (ref 4.5–5.6)

## 2024-06-21 MED ORDER — AMIODARONE LOAD VIA INFUSION
150.0000 mg | Freq: Once | INTRAVENOUS | Status: AC
  Administered 2024-06-21: 150 mg via INTRAVENOUS
  Filled 2024-06-21: qty 83.34

## 2024-06-21 MED ORDER — METOPROLOL TARTRATE 25 MG PO TABS
25.0000 mg | ORAL_TABLET | Freq: Two times a day (BID) | ORAL | Status: DC
  Administered 2024-06-21 – 2024-06-28 (×14): 25 mg via ORAL
  Filled 2024-06-21 (×15): qty 1

## 2024-06-21 MED ORDER — AMIODARONE HCL IN DEXTROSE 360-4.14 MG/200ML-% IV SOLN
60.0000 mg/h | INTRAVENOUS | Status: DC
  Administered 2024-06-21 (×2): 60 mg/h via INTRAVENOUS
  Filled 2024-06-21 (×2): qty 200

## 2024-06-21 MED ORDER — POTASSIUM CHLORIDE CRYS ER 20 MEQ PO TBCR
20.0000 meq | EXTENDED_RELEASE_TABLET | Freq: Once | ORAL | Status: AC
  Administered 2024-06-21: 20 meq via ORAL
  Filled 2024-06-21: qty 1

## 2024-06-21 MED ORDER — AMIODARONE HCL IN DEXTROSE 360-4.14 MG/200ML-% IV SOLN
30.0000 mg/h | INTRAVENOUS | Status: DC
Start: 2024-06-21 — End: 2024-06-22
  Administered 2024-06-21 – 2024-06-22 (×3): 30 mg/h via INTRAVENOUS
  Filled 2024-06-21 (×2): qty 200

## 2024-06-21 NOTE — Progress Notes (Signed)
 Informed Consent   Shared Decision Making/Informed Consent The risks (stroke, cardiac arrhythmias rarely resulting in the need for a temporary or permanent pacemaker, skin irritation or burns and complications associated with conscious sedation including aspiration, arrhythmia, respiratory failure and death), benefits (restoration of normal sinus rhythm) and alternatives of a direct current cardioversion were explained in detail to Barbara [name] and she agrees to proceed.

## 2024-06-21 NOTE — Telephone Encounter (Signed)
 [name] with [facility] informed of the message below

## 2024-06-21 NOTE — Plan of Care (Signed)
  Problem: Education: Goal: Knowledge of General Education information will improve Description: Including pain rating scale, medication(s)/side effects and non-pharmacologic comfort measures Outcome: Progressing   Problem: Clinical Measurements: Goal: Ability to maintain clinical measurements within normal limits will improve Outcome: Progressing Goal: Will remain free from infection Outcome: Progressing Goal: Diagnostic test results will improve Outcome: Progressing Goal: Respiratory complications will improve Outcome: Progressing   Problem: Health Behavior/Discharge Planning: Goal: Ability to manage health-related needs will improve Outcome: Not Progressing

## 2024-06-21 NOTE — Progress Notes (Addendum)
 2010 Patient Barbara [name] transferred to [facility] via [facility]. Telemetry monitor removed and CCMD contacted regarding patient transfer. Patient items packed, placed on [facility] telemetry, and Amiodarone  Drip continues at documented rate in [facility].  Patient stated she would notify family member once she was at [facility].

## 2024-06-21 NOTE — Progress Notes (Signed)
   06/21/24 2125  BiPAP/CPAP/SIPAP  BiPAP/CPAP/SIPAP Pt Type Adult  Reason BIPAP/CPAP not in use Non-compliant (patient states that she wears nasal pillows. and the mask that the hospital provides is uncomfortable)  BiPAP/CPAP /SiPAP Vitals  Pulse Rate 60  Resp 16  SpO2 98 %   Came to assess patient readiness for CPAP. Patient refuse. Reason for refusal documented above. RN aware of refusal.

## 2024-06-21 NOTE — Consult Note (Addendum)
 Cardiology Consultation   Patient ID: TABOR BARTRAM MRN: [id number]; DOB: 1941-05-28  Admit date: 06/20/2024 Date of Consult: 06/21/2024  PCP:  Johnny Garnette LABOR, MD   [facility] Providers Cardiologist:  Annabella Scarce, MD  Electrophysiologist:  Soyla Gladis Norton, MD       Patient Profile: Barbara [name] is a 83 y.o. female with a hx of apical HCM, OSA, hypertension, hyperlipidemia, CKD, atrial fibrillation on Eliquis , sick sinus syndrome s/p PPM, and CAD s/p PCI in 2000 who is being seen 06/21/2024 for the evaluation of Atrial fibrillation at the request of Donnamarie Chancellor.  History of Present Illness: Ms. Silvey is a 83 year old female with prior cardiac history listed below  In 2000 underwent PCI with BMS to LAD.  The most recent cath was performed on 05/2022 and showed mild nonobstructive CAD with 30% in-stent restenosis of the LAD stent.  A cardiac MRI was done on 05/2020 and showed apical hypertrophy measuring 15 mm, hyperdynamic systolic function, and patchy late gadolinium enhancement at the apex and RV insertion site.  These findings are consistent with apical HCM.  An echocardiogram was done on 05/2022 and showed a hyperdynamic LVEF of 75%, no LVH, elevated left atrial pressure, moderate left atrial dilation, mildly elevated pulmonary artery systolic pressure, normal RV function, aortic valve sclerosis with no evidence of stenosis or regurgitation, grossly normal valve function, and estimated right atrial pressure of 3 mmHg. Shortly after this echocardiogram on 05/2022 the patient successfully received a Saint Jude dual-chamber pacemaker for symptomatic bradycardia.  On 05/2024 patient presented to the emergency department for lower extremity edema and dyspnea on exertion.  Cardiology was consulted and patient was diagnosed with failure to thrive and HFpEF.  Her atrial fibrillation was well-controlled.  She did have elevated high-sensitivity troponin 238  > 211 that were felt to be secondary to demand ischemia.  She was not felt to be significantly volume overloaded and was gently diuresed.  Because of the lower extremity swelling lower extremity Dopplers were ordered and were negative. Echocardiogram showed hyperdynamic LVEF of >75%, severe apical LVH, systolic LV mid cavity obliteration with apical entrapment, G2 DD, no RWMA, elevated left atrial pressure, severely dilated left atrium, normal RV function, normal systolic artery pressure, mild MR with centrally directed jet, grossly normal valve function, and estimated right atrial pressure of 8 mmHg.  After the patient was stabilized she was discharged to a SNF.  On 06/20/2024 patient presented back to the emergency department for worsening shortness of breath and lower extremity edema.  She was found to be in atrial fibrillation with RVR and rates as fast as the 160s.  Her home metoprolol  25 mg daily was continued but she was also given 3 doses of as needed IV Lopressor  2.5mg .  Heart rates appear to have improved.  On interview patient reported that she presented to the hospital because of dyspnea on exertion and lower extremity edema.  Stated that she has not been able to walk by herself using a walker for the past 5 weeks because of how significant this dyspnea on exertion is and that she lives alone at home.  She was recently sent home from the rehab facility.  Reported having chest pain 1 to 2 weeks ago that was helped with nitroglycerin  denies any ongoing chest pain. Yesterday she had nausea and chills.  Denies orthopnea, vomiting, diaphoresis, melena, hematuria, and hematochezia.  Did report having pain in her knee but feels like the shortness of breath is what limits  her activities.   Labs show potassium of 3.5, magnesium  of 2.0, creatinine of 1.33 this appears to be about baseline, elevated high-sensitivity troponin 408 > 398, elevated BNP of 980.3, anemia with a hemoglobin of 10.1, and respiratory  panel was negative.  Pacer interrogation from yesterday is in patient's paper chart and shows that she has had episodes of atrial fibrillation with RVR since 06/17/2024.  She had 5 beats of of nonsustained VT on 06/20/2024  Pulmonary CTA was negative for a PE but did find unchanged trace right pleural effusion, and aortic atherosclerosis.  EKG showed atrial fibrillation with a rate of 162 and LVH.  Past Medical History:  Diagnosis Date   A-fib [facility])    Allergy    CAD (coronary artery disease)    sees Dr. Debby Sor  cardiac stents - 2000   CHF (congestive heart failure) (HCC)    Colon polyps    Complication of anesthesia    rash/hives with caines   Dyspnea    02/12/18  when my heart gets out of rhythm, it has not been out of rhythym- since I have been on Tikosyn  (11/2017)   Dysrhythmia    afib fib   GERD (gastroesophageal reflux disease)    takes OTC- Omeprazole - prn   Heart murmur    History of kidney stones    History of stress test    show normal perfusion without scar or ischemia, post EF 68%   Hx of echocardiogram    show an EF 55%-60% range with grade 1 diastolic dysfunction, she had mitral anular calcification with mild MR, moderate LA dilation and mild pulmonary hypertension with a PA estimated pressure of 39mm   Hyperlipidemia    Hypertension    NSTEMI (non-ST elevated myocardial infarction) (HCC)    Osteoarthritis    Pacemaker    Pneumonia    hx of 2015    PONV (postoperative nausea and vomiting)    Sleep apnea     Past Surgical History:  Procedure Laterality Date   CARDIAC CATHETERIZATION     11/2017   cardiac stents  2000   COLONOSCOPY  01-05-14   per Dr. Norleen Hint, clear, no repeats needed    COLONOSCOPY     CORONARY STENT PLACEMENT  2000   in LAD   CYSTOSCOPY WITH RETROGRADE PYELOGRAM, URETEROSCOPY AND STENT PLACEMENT Left 06/11/2022   Procedure: CYSTOSCOPY WITH RETROGRADE PYELOGRAM, LEFT STENT PLACEMENT;  Surgeon: Carolee Sherwood JONETTA DOUGLAS, MD;  Location: [facility]  ORS;  Service: Urology;  Laterality: Left;   CYSTOSCOPY/URETEROSCOPY/HOLMIUM LASER/STENT PLACEMENT Left 06/25/2022   Procedure: CYSTOSCOPY LEFT URETEROSCOPY/HOLMIUM LASER/STENT PLACEMENT;  Surgeon: Watt Norleen, MD;  Location: [facility] ORS;  Service: Urology;  Laterality: Left;  1 HR FOR THIS CASE   DIRECT LARYNGOSCOPY WITH RADIAESSE INJECTION N/A 02/13/2018   Procedure: DIRECT LARYNGOSCOPY WITH RADIAESSE INJECTION;  Surgeon: Carlie Clark, MD;  Location: [facility] OR;  Service: ENT;  Laterality: N/A;   ESOPHAGOGASTRODUODENOSCOPY N/A 03/11/2024   Procedure: EGD (ESOPHAGOGASTRODUODENOSCOPY);  Surgeon: Rosalie Kitchens, MD;  Location: [facility] ENDOSCOPY;  Service: Gastroenterology;  Laterality: N/A;   EYE SURGERY Left    CATARACT REMOVAL   HIP ARTHROPLASTY Left 09/27/2023   Procedure: ARTHROPLASTY BIPOLAR HIP (HEMIARTHROPLASTY);  Surgeon: Beverley Evalene JONETTA, MD;  Location: [facility] OR;  Service: Orthopedics;  Laterality: Left;   KNEE ARTHROSCOPY Left 01/06/2015   Procedure: LEFT KNEE ARTHROSCOPY, abrasion chondroplasty of the medial femerol condryl,medial and lateral menisectomy, microfracture , synovectomy of the suprpatellar pouch;  Surgeon: Tanda Heading, MD;  Location: [facility] ORS;  Service: Orthopedics;  Laterality: Left;   LEFT HEART CATH AND CORONARY ANGIOGRAPHY N/A 02/26/2019   Procedure: LEFT HEART CATH AND CORONARY ANGIOGRAPHY;  Surgeon: Burnard Debby LABOR, MD;  Location: [facility] INVASIVE CV LAB;  Service: Cardiovascular;  Laterality: N/A;   LEFT HEART CATH AND CORONARY ANGIOGRAPHY N/A 05/06/2022   Procedure: LEFT HEART CATH AND CORONARY ANGIOGRAPHY;  Surgeon: Mady Bruckner, MD;  Location: [facility] INVASIVE CV LAB;  Service: Cardiovascular;  Laterality: N/A;   MICROLARYNGOSCOPY W/VOCAL CORD INJECTION N/A 08/07/2018   Procedure: MICROLARYNGOSCOPY WITH VOCAL CORD INJECTION OF PROLARYN;  Surgeon: Carlie Clark, MD;  Location: [facility] OR;  Service: ENT;  Laterality: N/A;  JET VENTILATION   PACEMAKER IMPLANT N/A 05/08/2022   Procedure: PACEMAKER IMPLANT;   Surgeon: Inocencio Soyla Lunger, MD;  Location: [facility] INVASIVE CV LAB;  Service: Cardiovascular;  Laterality: N/A;   REVERSE SHOULDER ARTHROPLASTY Right 03/23/2020   Procedure: REVERSE SHOULDER ARTHROPLASTY;  Surgeon: Sharl Selinda Dover, MD;  Location: [facility] OR;  Service: Orthopedics;  Laterality: Right;   VAGINAL HYSTERECTOMY  1971     Home Medications:  Prior to Admission medications   Medication Sig Start Date End Date Taking? Authorizing Provider  rosuvastatin  (CRESTOR ) 40 MG tablet Take 40 mg by mouth daily. 06/17/24  Yes [provider]  acetaminophen  (TYLENOL ) 325 MG tablet Take 2 tablets (650 mg total) by mouth every 6 (six) hours as needed for mild pain (pain score 1-3) or fever (or Fever >/= 101). 05/22/24   Sebastian Toribio GAILS, MD  alum & mag hydroxide-simeth (MAALOX/MYLANTA) 200-200-20 MG/5ML suspension Take 30 mLs by mouth every 4 (four) hours as needed for indigestion. 09/30/23   Leotis Bogus, MD  apixaban  (ELIQUIS ) 2.5 MG TABS tablet Take 1 tablet (2.5 mg total) by mouth 2 (two) times daily. 01/21/24   Johnny Garnette LABOR, MD  diclofenac  Sodium (VOLTAREN ) 1 % GEL Apply 2 g topically 4 (four) times daily for 7 days, THEN 2 g 4 (four) times daily as needed. Apply to right lower ribs. 05/24/24 08/29/24  Sebastian Toribio GAILS, MD  empagliflozin  (JARDIANCE ) 10 MG TABS tablet Take 1 tablet (10 mg total) by mouth daily. 01/21/24   Johnny Garnette LABOR, MD  furosemide  (LASIX ) 20 MG tablet Take 10 mg by mouth daily as needed (for swelling of the ankles).    [provider]  ipratropium (ATROVENT ) 0.06 % nasal spray Place 2 sprays into both nostrils 2 (two) times daily as needed (allergies). 04/28/18   [provider]  isosorbide  mononitrate (IMDUR ) 30 MG 24 hr tablet Take 1 tablet (30 mg total) by mouth daily. 02/24/24   Johnny Garnette LABOR, MD  ketoconazole  (NIZORAL ) 2 % cream Apply 1 Application topically 2 (two) times daily as needed for irritation. 12/26/23   Johnny Garnette LABOR, MD  loratadine   (CLARITIN ) 10 MG tablet Take 10 mg by mouth daily as needed for allergies.    [provider]  megestrol  (MEGACE ) 40 MG tablet Take 1 tablet (40 mg total) by mouth in the morning. 04/30/24   Johnny Garnette LABOR, MD  methocarbamol  (ROBAXIN ) 500 MG tablet Take 1 tablet (500 mg total) by mouth every 6 (six) hours as needed for muscle spasms. 06/18/24   Johnny Garnette LABOR, MD  metoprolol  succinate (TOPROL -XL) 25 MG 24 hr tablet Take 1 tablet (25 mg total) by mouth daily. Take with or immediately following a meal. 03/11/24   Gonfa, Taye T, MD  mirtazapine  (REMERON ) 15 MG tablet Take 1 tablet (15 mg total)  by mouth at bedtime. 05/04/24   Johnny Garnette LABOR, MD  nitroGLYCERIN  (NITROSTAT ) 0.4 MG SL tablet Place 1 tablet (0.4 mg total) under the tongue every 5 (five) minutes as needed for chest pain. 04/06/24   Johnny Garnette LABOR, MD  Nutritional Supplements (BOOST/FIBER PO) Take 1 Bottle by mouth 3 (three) times daily.    [provider]  pantoprazole  (PROTONIX ) 40 MG tablet Take 1 tablet (40 mg total) by mouth daily. Patient taking differently: Take 40 mg by mouth daily as needed (for heartburn). 02/13/24 02/13/25  Johnny Garnette LABOR, MD  polyethylene glycol (MIRALAX  / GLYCOLAX ) 17 g packet Take 17 g by mouth 2 (two) times daily. 05/22/24   Sebastian Toribio GAILS, MD  senna-docusate (SENOKOT-S) 8.6-50 MG tablet Take 1 tablet by mouth 2 (two) times daily. 05/22/24   Sebastian Toribio GAILS, MD  triamcinolone  (NASACORT) 55 MCG/ACT nasal inhaler Place 1 spray into both nostrils daily as needed (for allergies).  10/17/12   Johnny Garnette LABOR, MD    Scheduled Meds:  apixaban   2.5 mg Oral BID   isosorbide  mononitrate  30 mg Oral Daily   metoprolol  succinate  25 mg Oral Daily   sodium chloride  flush  3 mL Intravenous Q12H   Continuous Infusions:  PRN Meds: acetaminophen  **OR** acetaminophen , bisacodyl , ondansetron  **OR** ondansetron  (ZOFRAN ) IV, senna-docusate  Allergies:    Allergies  Allergen Reactions   Lidocaine  Anaphylaxis    Morphine  Other (See Comments)    Body shuts down   Procaine Hcl Anaphylaxis, Rash and Other (See Comments)    Anything with 'caine' in it    Sulfonamide Derivatives Hives   Amlodipine  Swelling   Ezetimibe-Simvastatin Other (See Comments)    VYTORIN = Joint pain   Latex Itching and Other (See Comments)    WELTS   Norco [Hydrocodone -Acetaminophen ] Nausea And Vomiting and Other (See Comments)    States does ok with IV form    Tramadol  Nausea And Vomiting   Tape Itching and Other (See Comments)    Patient prefers either paper tape or Coban wrap    Social History:   Social History   Socioeconomic History   Marital status: Widowed    Spouse name: Not on file   Number of children: 2   Years of education: college   Highest education level: High school graduate  Occupational History   Occupation: Retired  Tobacco Use   Smoking status: Former    Current packs/day: 0.00    Types: Cigarettes    Start date: 11/04/1956    Quit date: 11/04/1996    Years since quitting: 27.6   Smokeless tobacco: Never  Vaping Use   Vaping status: Never Used  Substance and Sexual Activity   Alcohol use: No    Alcohol/week: 0.0 standard drinks of alcohol   Drug use: No   Sexual activity: Not Currently  Other Topics Concern   Not on file  Social History Narrative   Not on file   Social Drivers of Health   Financial Resource Strain: Low Risk  (08/05/2022)   Overall Financial Resource Strain (CARDIA)    Difficulty of Paying Living Expenses: Not hard at all  Food Insecurity: No Food Insecurity (06/20/2024)   Hunger Vital Sign    Worried About Running Out of Food in the Last Year: Never true    Ran Out of Food in the Last Year: Never true  Transportation Needs: Unmet Transportation Needs (06/20/2024)   PRAPARE - Administrator, Civil Service (Medical): Yes  Lack of Transportation (Non-Medical): Yes  Physical Activity: Insufficiently Active (07/02/2022)   Exercise Vital Sign    Days  of Exercise per Week: 1 day    Minutes of Exercise per Session: 20 min  Stress: No Stress Concern Present (07/02/2022)   [employer] of Occupational Health - Occupational Stress Questionnaire    Feeling of Stress : Not at all  Social Connections: Moderately Integrated (06/20/2024)   Social Connection and Isolation Panel    Frequency of Communication with Friends and Family: More than three times a week    Frequency of Social Gatherings with Friends and Family: Once a week    Attends Religious Services: More than 4 times per year    Active Member of [employer] or Organizations: Yes    Attends Banker Meetings: Never    Marital Status: Widowed  Intimate Partner Violence: Not At Risk (06/20/2024)   Humiliation, Afraid, Rape, and Kick questionnaire    Fear of Current or Ex-Partner: No    Emotionally Abused: No    Physically Abused: No    Sexually Abused: No    Family History:    Family History  Problem Relation Age of Onset   Cancer Maternal Grandmother    Heart disease Maternal Grandfather      ROS:  Please see the history of present illness.   All other ROS reviewed and negative.     Physical Exam/Data: Vitals:   06/20/24 2214 06/20/24 2316 06/21/24 0025 06/21/24 0324  BP: (!) 137/98 126/67  117/61  Pulse: (!) 118 (!) 102  94  Resp: 20 18 20 20   Temp: 98.1 F (36.7 C) 98.6 F (37 C)  99.3 F (37.4 C)  TempSrc: Oral Oral  Oral  SpO2: 98% 100% 95% 98%  Weight: 58 kg     Height: 5' 8 (1.727 m)       Intake/Output Summary (Last 24 hours) at 06/21/2024 0721 Last data filed at 06/20/2024 2243 Gross per 24 hour  Intake 503 ml  Output --  Net 503 ml      06/20/2024   10:14 PM 06/20/2024    3:35 PM 06/20/2024    3:29 PM  Last 3 Weights  Weight (lbs) 127 lb 14.4 oz 113 lb 113 lb  Weight (kg) 58.015 kg 51.256 kg 51.256 kg     Body mass index is 19.45 kg/m.  General: Cachectic, well developed, in no acute distress.  Alert and orientated HEENT:  normal Neck: no JVD Vascular: No carotid bruits; Distal pulses 2+ bilaterally Cardiac:  normal S1, S2; regularly irregular rhythm; no murmur  Lungs:  clear to auscultation bilaterally, no wheezing, rhonchi or rales  Abd: soft, nontender, no hepatomegaly  Ext: no edema Musculoskeletal:  No deformities, feels weak. Skin: warm and dry  Neuro:   no focal abnormalities noted Psych:  Normal affect   EKG:  The EKG was personally reviewed and demonstrates:  atrial fibrillation with a rate of 162, a PVC, and LVH. Telemetry:  Telemetry was personally reviewed and demonstrates: Atrial fibrillation with heart rates in the 70s to 90s in the unit.  Heart rates in the emergency department were in the 150s to 100s.  Relevant CV Studies: Echo pending  Laboratory Data: High Sensitivity Troponin:   Recent Labs  Lab 06/20/24 1538 06/20/24 1734  TROPONINIHS 408* 398*     Chemistry Recent Labs  Lab 06/18/24 1545 06/20/24 1538  NA 137 135  K 4.2 3.5  CL 107 103  CO2 21 20*  GLUCOSE 103* 118*  BUN 30* 36*  CREATININE 1.30* 1.33*  CALCIUM  11.6* 11.2*  MG 2.2 2.0  GFRNONAA  --  39*  ANIONGAP  --  12    Recent Labs  Lab 06/20/24 1538  PROT 8.2*  ALBUMIN 2.9*  AST 43*  ALT 15  ALKPHOS 116  BILITOT 0.7   Lipids No results for input(s): CHOL, TRIG, HDL, LABVLDL, LDLCALC, CHOLHDL in the last 168 hours.  Hematology Recent Labs  Lab 06/20/24 1538 06/21/24 0336  WBC 4.0 3.4*  RBC 4.21 3.57*  HGB 11.7* 10.1*  HCT 37.7 32.4*  MCV 89.5 90.8  MCH 27.8 28.3  MCHC 31.0 31.2  RDW 14.6 14.7  PLT 224 197   Thyroid  No results for input(s): TSH, FREET4 in the last 168 hours.  BNP Recent Labs  Lab 06/20/24 1538  BNP 980.3*    DDimer No results for input(s): DDIMER in the last 168 hours.  Radiology/Studies:  CT Angio Chest PE W and/or Wo Contrast Result Date: 06/20/2024 CLINICAL DATA:  Baseline short of breath. Increased shortness of breath today with leg swelling.  EXAM: CT ANGIOGRAPHY CHEST WITH CONTRAST TECHNIQUE: Multidetector CT imaging of the chest was performed using the standard protocol during bolus administration of intravenous contrast. Multiplanar CT image reconstructions and MIPs were obtained to evaluate the vascular anatomy. RADIATION DOSE REDUCTION: This exam was performed according to the departmental dose-optimization program which includes automated exposure control, adjustment of the mA and/or kV according to patient size and/or use of iterative reconstruction technique. CONTRAST:  70mL OMNIPAQUE  IOHEXOL  350 MG/ML SOLN COMPARISON:  Same day chest radiograph and CT chest 05/22/2024 FINDINGS: Cardiovascular: Negative for pulmonary embolism. Normal caliber thoracic aorta. Coronary artery and aortic atherosclerotic calcification. No pericardial effusion. Left chest wall pacemaker. Mediastinum/Nodes: Trachea and esophagus are unremarkable. No pathologic adenopathy. Lungs/Pleura: Biapical pleuroparenchymal scarring and atelectasis in the lower lobes. Trace right pleural effusion. No pneumothorax. Upper Abdomen: No acute abnormality. Musculoskeletal: No acute fracture.  Right reverse TSA. Review of the MIP images confirms the above findings. IMPRESSION: 1. Negative for pulmonary embolism. 2. Trace right pleural effusion, unchanged from 05/22/2024. 3. Aortic Atherosclerosis (ICD10-I70.0). Electronically Signed   By: Norman Gatlin M.D.   On: 06/20/2024 21:26   DG Chest Portable 1 View Result Date: 06/20/2024 CLINICAL DATA:  Short of breath EXAM: PORTABLE CHEST 1 VIEW COMPARISON:  None Available. FINDINGS: LEFT-sided pacer overlies normal cardiac silhouette. Chronic elevation LEFT hemidiaphragm. No effusion, infiltrate, pneumothorax. RIGHT shoulder arthroplasty IMPRESSION: No acute cardiopulmonary process. Electronically Signed   By: Jackquline Boxer M.D.   On: 06/20/2024 16:27     Assessment and Plan: Barbara [name] is a 83 y.o. female with a hx of  apical HCM, OSA, hypertension, hyperlipidemia, CKD, atrial fibrillation on Eliquis , sick sinus syndrome s/p PPM, and CAD s/p PCI in 2000 who is being seen 06/21/2024 for the evaluation of Atrial fibrillation at the request of Donnamarie Chancellor.  Dyspnea on exertion Lower extremity swelling CAD s/p PCI with BMS in 2000 Reported having chest pain 1 to 2 weeks ago while lying in bed that was helped with nitroglycerin  denies any ongoing chest pain. Most recent cath on 05/2022 showed mild nonobstructive CAD and 30% in-stent restenosis in the LAD. Pulmonary CTA was negative for a PE.   Apical hypertrophic cardiomyopathy A cardiac MRI was done on 05/2020 and showed apical hypertrophy measuring 15 mm, hyperdynamic systolic function, and patchy late gadolinium enhancement at the apex and RV insertion site.  These findings  are consistent with apical HCM. Echocardiogram on 05/2024 showed hyperdynamic LVEF of >75%, severe apical LVH, systolic LV mid cavity obliteration with apical entrapment, G2 DD, no RWMA, elevated left atrial pressure, severely dilated left atrium, normal RV function, normal systolic artery pressure, mild MR with centrally directed jet, grossly normal valve function, and estimated right atrial pressure of 8 mmHg. Repeat echo pending   Paroxysmal atrial fibrillation SSS s/p ICD CHA2DS2-VASc Score = 5 [CHF History: 0, HTN History: 1, Diabetes History: 0, Stroke History: 0, Vascular Disease History: 1, Age Score: 2, Gender Score: 1].  Therefore, the patient's annual risk of stroke is 7.2 %.    Pacemaker interrogation shows that the patient has had episodes of atrial fibrillation with RVR since 06/17/2024.  When she was initially hospitalized she was in the 160s and she was given as needed IV Lopressor  and the rates improved.  Has not missed any dose of Eliquis . Continue Eliquis  2.5 mg twice daily Stop metoprolol  succinate Start metoprolol  tartrate to 25mg  daily Plan on DCCV tomorrow. Plan to  transfer to [facility]. Start IV amiodarone  and bolus   Otherwise manage per primary   Risk Assessment/Risk Scores:        CHA2DS2-VASc Score = 5  } This indicates a 7.2% annual risk of stroke. The patient's score is based upon: CHF History: 0 HTN History: 1 Diabetes History: 0 Stroke History: 0 Vascular Disease History: 1 Age Score: 2 Gender Score: 1        For questions or updates, please contact [facility] Please consult [email or web address] for contact info under    Signed, Morse Clause, PA-C  06/21/2024 7:21 AM

## 2024-06-21 NOTE — Progress Notes (Signed)
   06/21/24 0025  BiPAP/CPAP/SIPAP  $ Non-Invasive Home Ventilator  Initial  $ Face Mask Small Yes  BiPAP/CPAP/SIPAP Pt Type Adult  BiPAP/CPAP/SIPAP Resmed  Mask Type Nasal mask  Dentures removed? Not applicable  Mask Size Small  Respiratory Rate 20 breaths/min  FiO2 (%) 21 %  Patient Home Machine No  Patient Home Mask No  Patient Home Tubing No  Auto Titrate Yes  Minimum cmH2O 5 cmH2O  Maximum cmH2O 20 cmH2O  Nasal massage performed Yes  CPAP/SIPAP surface wiped down Yes  Device Plugged into RED Power Outlet Yes  BiPAP/CPAP /SiPAP Vitals  Resp 20  SpO2 95 %  Bilateral Breath Sounds Clear;Diminished

## 2024-06-21 NOTE — Telephone Encounter (Signed)
 Copied from CRM #[id number]. Topic: General - Other >> Jun 18, 2024  4:16 PM Chiquita SQUIBB wrote: Reason for CRM: Bari from [facility] is calling in stating they received a referral for the patient to see them from her rehab services. They spoke to the patient today and the patient was very confused as she thought Dr. Johnny was finding home care for her. [facility] is confused if they should be scheduling the patient or not, they also wanted to advise the doctor the soonest they could go to the patient is on Tuesday. Please advise [name] at [phone] for clarification.

## 2024-06-21 NOTE — Progress Notes (Signed)
 PROGRESS NOTE  Barbara [name] FMW:[id number] DOB: 1941/08/12 DOA: 06/20/2024 PCP: Johnny Garnette LABOR, MD  HPI/Recap of past 24 hours: Barbara [name] is a 83 y.o. female with medical history significant for CAD (s/p PCI LAD 2000), PAF on Eliquis , chronic HFpEF, apical HCM, SSS s/p PPM, CKD stage IIIb, chronic hypercalcemia, HTN, HLD, chronic anemia, OSA who presented to the ED for evaluation of progressive SOB, palpitations, chest pain for the past couple of days, and unintentional weight loss for months. In the ED, VS with HR in the 160s, otherwise fairly stable. Labs showed troponin 408 > 398, BNP 980.3, calcium  11.2. SARS-CoV-2, influenza, RSV PCR negative.  UA negative for any infection. Portable chest x-ray negative for focal consolidation, edema, effusion. CTA chest negative for PE.  Cardiology consulted.  [facility] hospitalist admitted patient for further management.     Today, patient denies any current chest pain, still reports some SOB but denies it is worsening, reports some palpitations.  Reported her significant weight loss, with poor appetite.  Met with cardiologist, who recommended transfer to [facility] for possible cardioversion.    Assessment/Plan: Principal Problem:   Paroxysmal atrial fibrillation with rapid ventricular response (HCC) Active Problems:   CAD (coronary artery disease/elevated troponin   Essential hypertension   Chronic kidney disease, stage 3b (HCC)   Hyperlipidemia   OSA (obstructive sleep apnea)   Chronic heart failure with preserved ejection fraction (HFpEF) (HCC)   Hypercalcemia   Paroxysmal atrial fibrillation with RVR SSS s/p PPM Patient presenting with A-fib RVR, rate 160s Free T4, TSH pending Cardiology consulted, started patient on amnio drip, switched to p.o. Lopressor  25 mg twice daily Plan for cardioversion on 06/22/2024 Continue Eliquis  Telemetry, monitor closely   Chronic HFpEF BNP 980.3 TTE on 05/18/2024 showed systolic LV  mid cavity obliteration with apical entrapment, no LV outflow obstruction.  EF >75% with hyperdynamic function.  No RWMA.  Severe asymmetric apical LVH, G2DD. Continue metoprolol  Cardiology consulted  CAD s/p PCI History of PCI to LAD in 2000 Troponin elevated 408 > 398 Currently chest pain-free Likely demand ischemia in setting of A-fib RVR LHC 05/06/2022 showed mild-moderate nonobstructive CAD and 30% in-stent restenosis of LAD stent Continue metoprolol , Imdur  Continue Eliquis    Chronic hypercalcemia Patient with persistent hypercalcemia of unknown etiology.  Has had extensive prior workup.  Imaging including chest, abdomen, pelvis have been unrevealing.  She had negative workup for monoclonal gammopathy.  X-ray skeletal survey did not show evidence of bone lytic lesions.  PTH related peptide was normal Continue workup as outpatient   CKD stage IIIb Renal function stable Daily BMP   Hypertension Continue metoprolol  and Imdur .   Hyperlipidemia Does not appear to be on statin as an outpatient.   OSA Continue CPAP nightly  Hypoalbuminemia Noted significant weight loss, reports poor appetite Dietitian consulted      Estimated body mass index is 19.45 kg/m as calculated from the following:   Height as of this encounter: 5' 8 (1.727 m).   Weight as of this encounter: 58 kg.     Code Status: Full  Family Communication: None at bedside  Disposition Plan: Status is: Observation The patient will require care spanning > 2 midnights and should be moved to inpatient because: Level of care      Consultants: Cardiology  Procedures: None  Antimicrobials: None  DVT prophylaxis: Eliquis    Objective: Vitals:   06/20/24 2316 06/21/24 0025 06/21/24 0324 06/21/24 0737  BP: 126/67  117/61 (!) 141/91  Pulse: ROLLEN)  102  94 68  Resp: 18 20 20 18   Temp: 98.6 F (37 C)  99.3 F (37.4 C) 98.3 F (36.8 C)  TempSrc: Oral  Oral Oral  SpO2: 100% 95% 98% 99%  Weight:       Height:        Intake/Output Summary (Last 24 hours) at 06/21/2024 1058 Last data filed at 06/21/2024 9075 Gross per 24 hour  Intake 503 ml  Output 450 ml  Net 53 ml   Filed Weights   06/20/24 1529 06/20/24 1535 06/20/24 2214  Weight: 51.3 kg 51.3 kg 58 kg    Exam: General: NAD, thin, elderly Cardiovascular: S1, S2 present Respiratory: CTAB Abdomen: Soft, nontender, nondistended, bowel sounds present Musculoskeletal: No bilateral pedal edema noted Skin: Normal Psychiatry: Normal mood     Data Reviewed: CBC: Recent Labs  Lab 06/20/24 1538 06/21/24 0336  WBC 4.0 3.4*  NEUTROABS 3.1  --   HGB 11.7* 10.1*  HCT 37.7 32.4*  MCV 89.5 90.8  PLT 224 197   Basic Metabolic Panel: Recent Labs  Lab 06/18/24 1545 06/20/24 1538 06/21/24 0822  NA 137 135 139  K 4.2 3.5 3.5  CL 107 103 109  CO2 21 20* 23  GLUCOSE 103* 118* 92  BUN 30* 36* 29*  CREATININE 1.30* 1.33* 1.30*  CALCIUM  11.6* 11.2* 10.6*  MG 2.2 2.0  --   PHOS 2.6  --  2.7   GFR: Estimated Creatinine Clearance: 29 mL/min (A) (by C-G formula based on SCr of 1.3 mg/dL (H)). Liver Function Tests: Recent Labs  Lab 06/20/24 1538 06/21/24 0822  AST 43*  --   ALT 15  --   ALKPHOS 116  --   BILITOT 0.7  --   PROT 8.2*  --   ALBUMIN 2.9* 2.5*   No results for input(s): LIPASE, AMYLASE in the last 168 hours. No results for input(s): AMMONIA in the last 168 hours. Coagulation Profile: No results for input(s): INR, PROTIME in the last 168 hours. Cardiac Enzymes: No results for input(s): CKTOTAL, CKMB, CKMBINDEX, TROPONINI in the last 168 hours. BNP (last 3 results) No results for input(s): PROBNP in the last 8760 hours. HbA1C: No results for input(s): HGBA1C in the last 72 hours. CBG: No results for input(s): GLUCAP in the last 168 hours. Lipid Profile: No results for input(s): CHOL, HDL, LDLCALC, TRIG, CHOLHDL, LDLDIRECT in the last 72 hours. Thyroid  Function  Tests: No results for input(s): TSH, T4TOTAL, FREET4, T3FREE, THYROIDAB in the last 72 hours. Anemia Panel: No results for input(s): VITAMINB12, FOLATE, FERRITIN, TIBC, IRON , RETICCTPCT in the last 72 hours. Urine analysis:    Component Value Date/Time   COLORURINE YELLOW 06/20/2024 2116   APPEARANCEUR HAZY (A) 06/20/2024 2116   LABSPEC 1.020 06/20/2024 2116   PHURINE 5.0 06/20/2024 2116   GLUCOSEU >=500 (A) 06/20/2024 2116   GLUCOSEU NEGATIVE 09/13/2021 1203   HGBUR NEGATIVE 06/20/2024 2116   HGBUR negative 10/17/2010 0859   BILIRUBINUR NEGATIVE 06/20/2024 2116   BILIRUBINUR neg 08/01/2021 1037   KETONESUR NEGATIVE 06/20/2024 2116   PROTEINUR NEGATIVE 06/20/2024 2116   UROBILINOGEN 0.2 09/13/2021 1203   NITRITE NEGATIVE 06/20/2024 2116   LEUKOCYTESUR NEGATIVE 06/20/2024 2116   Sepsis Labs: @LABRCNTIP (procalcitonin:4,lacticidven:4)  ) Recent Results (from the past 240 hours)  Resp panel by RT-PCR (RSV, Flu A&B, Covid) Anterior Nasal Swab     Status: None   Collection Time: 06/20/24  4:12 PM   Specimen: Anterior Nasal Swab  Result Value Ref Range Status  SARS Coronavirus 2 by RT PCR NEGATIVE NEGATIVE Final    Comment: (NOTE) SARS-CoV-2 target nucleic acids are NOT DETECTED.  The SARS-CoV-2 RNA is generally detectable in upper respiratory specimens during the acute phase of infection. The lowest concentration of SARS-CoV-2 viral copies this assay can detect is 138 copies/mL. A negative result does not preclude SARS-Cov-2 infection and should not be used as the sole basis for treatment or other patient management decisions. A negative result may occur with  improper specimen collection/handling, submission of specimen other than nasopharyngeal swab, presence of viral mutation(s) within the areas targeted by this assay, and inadequate number of viral copies(<138 copies/mL). A negative result must be combined with clinical observations, patient history,  and epidemiological information. The expected result is Negative.  Fact Sheet for Patients:  BloggerCourse.com  Fact Sheet for Healthcare Providers:  SeriousBroker.it  This test is no t yet approved or cleared by the [place]  FDA and  has been authorized for detection and/or diagnosis of SARS-CoV-2 by FDA under an Emergency Use Authorization (EUA). This EUA will remain  in effect (meaning this test can be used) for the duration of the COVID-19 declaration under Section 564(b)(1) of the Act, 21 U.S.C.section 360bbb-3(b)(1), unless the authorization is terminated  or revoked sooner.       Influenza A by PCR NEGATIVE NEGATIVE Final   Influenza B by PCR NEGATIVE NEGATIVE Final    Comment: (NOTE) The Xpert Xpress SARS-CoV-2/FLU/RSV plus assay is intended as an aid in the diagnosis of influenza from Nasopharyngeal swab specimens and should not be used as a sole basis for treatment. Nasal washings and aspirates are unacceptable for Xpert Xpress SARS-CoV-2/FLU/RSV testing.  Fact Sheet for Patients: BloggerCourse.com  Fact Sheet for Healthcare Providers: SeriousBroker.it  This test is not yet approved or cleared by the [place]  FDA and has been authorized for detection and/or diagnosis of SARS-CoV-2 by FDA under an Emergency Use Authorization (EUA). This EUA will remain in effect (meaning this test can be used) for the duration of the COVID-19 declaration under Section 564(b)(1) of the Act, 21 U.S.C. section 360bbb-3(b)(1), unless the authorization is terminated or revoked.     Resp Syncytial Virus by PCR NEGATIVE NEGATIVE Final    Comment: (NOTE) Fact Sheet for Patients: BloggerCourse.com  Fact Sheet for Healthcare Providers: SeriousBroker.it  This test is not yet approved or cleared by the [place]  FDA and has  been authorized for detection and/or diagnosis of SARS-CoV-2 by FDA under an Emergency Use Authorization (EUA). This EUA will remain in effect (meaning this test can be used) for the duration of the COVID-19 declaration under Section 564(b)(1) of the Act, 21 U.S.C. section 360bbb-3(b)(1), unless the authorization is terminated or revoked.  Performed at [facility], [address], [place], [place] [address]       Studies: CT Angio Chest PE W and/or Wo Contrast Result Date: 06/20/2024 CLINICAL DATA:  Baseline short of breath. Increased shortness of breath today with leg swelling. EXAM: CT ANGIOGRAPHY CHEST WITH CONTRAST TECHNIQUE: Multidetector CT imaging of the chest was performed using the standard protocol during bolus administration of intravenous contrast. Multiplanar CT image reconstructions and MIPs were obtained to evaluate the vascular anatomy. RADIATION DOSE REDUCTION: This exam was performed according to the departmental dose-optimization program which includes automated exposure control, adjustment of the mA and/or kV according to patient size and/or use of iterative reconstruction technique. CONTRAST:  70mL OMNIPAQUE  IOHEXOL  350 MG/ML SOLN COMPARISON:  Same day chest radiograph and  CT chest 05/22/2024 FINDINGS: Cardiovascular: Negative for pulmonary embolism. Normal caliber thoracic aorta. Coronary artery and aortic atherosclerotic calcification. No pericardial effusion. Left chest wall pacemaker. Mediastinum/Nodes: Trachea and esophagus are unremarkable. No pathologic adenopathy. Lungs/Pleura: Biapical pleuroparenchymal scarring and atelectasis in the lower lobes. Trace right pleural effusion. No pneumothorax. Upper Abdomen: No acute abnormality. Musculoskeletal: No acute fracture.  Right reverse TSA. Review of the MIP images confirms the above findings. IMPRESSION: 1. Negative for pulmonary embolism. 2. Trace right pleural effusion, unchanged from 05/22/2024. 3.  Aortic Atherosclerosis (ICD10-I70.0). Electronically Signed   By: Norman Gatlin M.D.   On: 06/20/2024 21:26   DG Chest Portable 1 View Result Date: 06/20/2024 CLINICAL DATA:  Short of breath EXAM: PORTABLE CHEST 1 VIEW COMPARISON:  None Available. FINDINGS: LEFT-sided pacer overlies normal cardiac silhouette. Chronic elevation LEFT hemidiaphragm. No effusion, infiltrate, pneumothorax. RIGHT shoulder arthroplasty IMPRESSION: No acute cardiopulmonary process. Electronically Signed   By: Jackquline Boxer M.D.   On: 06/20/2024 16:27    Scheduled Meds:  apixaban   2.5 mg Oral BID   isosorbide  mononitrate  30 mg Oral Daily   metoprolol  tartrate  25 mg Oral BID   sodium chloride  flush  3 mL Intravenous Q12H    Continuous Infusions:  amiodarone  60 mg/hr (06/21/24 1044)   Followed by   amiodarone        LOS: 0 days     Barbara JINNY Cage, MD [facility]  If 7PM-7AM, please contact night-coverage [email or web address] 06/21/2024, 10:58 AM

## 2024-06-22 ENCOUNTER — Encounter (HOSPITAL_COMMUNITY)
Admission: EM | Disposition: A | Discharge status: Home Health | Payer: Self-pay | Source: Home / Self Care | Attending: Internal Medicine

## 2024-06-22 DIAGNOSIS — I480 Paroxysmal atrial fibrillation: Secondary | ICD-10-CM | POA: Diagnosis not present

## 2024-06-22 DIAGNOSIS — I2510 Atherosclerotic heart disease of native coronary artery without angina pectoris: Secondary | ICD-10-CM

## 2024-06-22 LAB — TSH: TSH: 4.672 u[IU]/mL — ABNORMAL HIGH (ref 0.350–4.500)

## 2024-06-22 LAB — BASIC METABOLIC PANEL WITH GFR
Anion gap: 4 — ABNORMAL LOW (ref 5–15)
BUN: 27 mg/dL — ABNORMAL HIGH (ref 8–23)
CO2: 22 mmol/L (ref 22–32)
Calcium: 10.1 mg/dL (ref 8.9–10.3)
Chloride: 108 mmol/L (ref 98–111)
Creatinine, Ser: 1.29 mg/dL — ABNORMAL HIGH (ref 0.44–1.00)
GFR, Estimated: 41 mL/min — ABNORMAL LOW (ref >60)
Glucose, Bld: 88 mg/dL (ref 70–99)
Potassium: 3.5 mmol/L (ref 3.5–5.1)
Sodium: 134 mmol/L — ABNORMAL LOW (ref 135–145)

## 2024-06-22 LAB — CBC
HCT: 30.5 % — ABNORMAL LOW (ref 36.0–46.0)
Hemoglobin: 9.9 g/dL — ABNORMAL LOW (ref 12.0–15.0)
MCH: 28.8 pg (ref 26.0–34.0)
MCHC: 32.5 g/dL (ref 30.0–36.0)
MCV: 88.7 fL (ref 80.0–100.0)
Platelets: 176 K/uL (ref 150–400)
RBC: 3.44 MIL/uL — ABNORMAL LOW (ref 3.87–5.11)
RDW: 14.7 % (ref 11.5–15.5)
WBC: 3.2 K/uL — ABNORMAL LOW (ref 4.0–10.5)
nRBC: 0 % (ref 0.0–0.2)

## 2024-06-22 SURGERY — CARDIOVERSION (CATH LAB)
Anesthesia: General

## 2024-06-22 MED ORDER — AMIODARONE HCL 200 MG PO TABS
200.0000 mg | ORAL_TABLET | Freq: Two times a day (BID) | ORAL | Status: DC
  Administered 2024-06-22 – 2024-06-28 (×13): 200 mg via ORAL
  Filled 2024-06-22 (×13): qty 1

## 2024-06-22 MED ORDER — SODIUM CHLORIDE 0.9 % IV SOLN: INTRAVENOUS | Status: AC

## 2024-06-22 MED ORDER — ENSURE PLUS HIGH PROTEIN PO LIQD
237.0000 mL | Freq: Two times a day (BID) | ORAL | Status: DC
  Administered 2024-06-23 – 2024-06-28 (×9): 237 mL via ORAL
  Filled 2024-06-22: qty 237

## 2024-06-22 MED ORDER — SODIUM CHLORIDE 0.9 % IV SOLN: INTRAVENOUS | Status: DC

## 2024-06-22 MED ORDER — ADULT MULTIVITAMIN W/MINERALS CH
1.0000 | ORAL_TABLET | Freq: Every day | ORAL | Status: DC
  Administered 2024-06-22 – 2024-06-28 (×7): 1 via ORAL
  Filled 2024-06-22 (×7): qty 1

## 2024-06-22 NOTE — Progress Notes (Signed)
 Cardiologist:  [place]  Subjective:  Denies SSCP, palpitations or Dyspnea Has converted to sinus with A pacing on iv amiodarone   Objective:  Vitals:   06/22/24 0109 06/22/24 0339 06/22/24 0539 06/22/24 0740  BP: (!) 110/52 (!) 122/40 (!) 137/50   Pulse: 60 (!) 58 60   Resp: 18 18 18 17   Temp: 98 F (36.7 C) 98.2 F (36.8 C) 98.2 F (36.8 C) 98.9 F (37.2 C)  TempSrc: Oral Oral Oral Oral  SpO2: 99% 98% 98%   Weight:      Height:        Intake/Output from previous day:  Intake/Output Summary (Last 24 hours) at 06/22/2024 0854 Last data filed at 06/22/2024 0510 Gross per 24 hour  Intake 1649.06 ml  Output 1000 ml  Net 649.06 ml    Physical Exam:  PPM under left clavicle SEM Lungs clear No edema Abdomen benign  Lab Results: Basic Metabolic Panel: Recent Labs    06/20/24 1538 06/21/24 0822 06/22/24 0529  NA 135 139 134*  K 3.5 3.5 3.5  CL 103 109 108  CO2 20* 23 22  GLUCOSE 118* 92 88  BUN 36* 29* 27*  CREATININE 1.33* 1.30* 1.29*  CALCIUM  11.2* 10.6* 10.1  MG 2.0  --   --   PHOS  --  2.7  --    Liver Function Tests: Recent Labs    06/20/24 1538 06/21/24 0822  AST 43*  --   ALT 15  --   ALKPHOS 116  --   BILITOT 0.7  --   PROT 8.2*  --   ALBUMIN 2.9* 2.5*   No results for input(s): LIPASE, AMYLASE in the last 72 hours. CBC: Recent Labs    06/20/24 1538 06/21/24 0336 06/22/24 0529  WBC 4.0 3.4* 3.2*  NEUTROABS 3.1  --   --   HGB 11.7* 10.1* 9.9*  HCT 37.7 32.4* 30.5*  MCV 89.5 90.8 88.7  PLT 224 197 176    Thyroid  Function Tests: Recent Labs    06/22/24 0529  TSH 4.672*   Anemia Panel: No results for input(s): VITAMINB12, FOLATE, FERRITIN, TIBC, IRON , RETICCTPCT in the last 72 hours.  Imaging: CT Angio Chest PE W and/or Wo Contrast Result Date: 06/20/2024 CLINICAL DATA:  Baseline short of breath. Increased shortness of breath today with leg swelling. EXAM: CT ANGIOGRAPHY CHEST WITH CONTRAST  TECHNIQUE: Multidetector CT imaging of the chest was performed using the standard protocol during bolus administration of intravenous contrast. Multiplanar CT image reconstructions and MIPs were obtained to evaluate the vascular anatomy. RADIATION DOSE REDUCTION: This exam was performed according to the departmental dose-optimization program which includes automated exposure control, adjustment of the mA and/or kV according to patient size and/or use of iterative reconstruction technique. CONTRAST:  70mL OMNIPAQUE  IOHEXOL  350 MG/ML SOLN COMPARISON:  Same day chest radiograph and CT chest 05/22/2024 FINDINGS: Cardiovascular: Negative for pulmonary embolism. Normal caliber thoracic aorta. Coronary artery and aortic atherosclerotic calcification. No pericardial effusion. Left chest wall pacemaker. Mediastinum/Nodes: Trachea and esophagus are unremarkable. No pathologic adenopathy. Lungs/Pleura: Biapical pleuroparenchymal scarring and atelectasis in the lower lobes. Trace right pleural effusion. No pneumothorax. Upper Abdomen: No acute abnormality. Musculoskeletal: No acute fracture.  Right reverse TSA. Review of the MIP images confirms the above findings. IMPRESSION: 1. Negative for pulmonary embolism. 2. Trace right pleural effusion, unchanged from 05/22/2024. 3. Aortic Atherosclerosis (ICD10-I70.0). Electronically Signed   By: Norman Gatlin M.D.   On: 06/20/2024 21:26   DG Chest Portable 1 View  Result Date: 06/20/2024 CLINICAL DATA:  Short of breath EXAM: PORTABLE CHEST 1 VIEW COMPARISON:  None Available. FINDINGS: LEFT-sided pacer overlies normal cardiac silhouette. Chronic elevation LEFT hemidiaphragm. No effusion, infiltrate, pneumothorax. RIGHT shoulder arthroplasty IMPRESSION: No acute cardiopulmonary process. Electronically Signed   By: Jackquline Boxer M.D.   On: 06/20/2024 16:27    Cardiac Studies:  ECG: A pacing PAC   Telemetry:  A pacing   Echo: EF > 75% apical HOCM mild MR normal  RV  Medications:    apixaban   2.5 mg Oral BID   isosorbide  mononitrate  30 mg Oral Daily   metoprolol  tartrate  25 mg Oral BID   sodium chloride  flush  3 mL Intravenous Q12H      sodium chloride      amiodarone  30 mg/hr (06/22/24 0837)    Assessment/Plan:   PAF:  on amiodarone  iv will transition to oral. Interrogate pacer prior 3% PAF. Continue low dose apixaban . Cancel DCC and feed  Apical HOCM:  lends itself to diastolic dysfunction euvolemic continue beta blocker   CAD distant BMS to LAD 2000 cath 05/2022 no obstructive dx. No angina continue beta blocker and lopressor    Maude Emmer 06/22/2024, 8:54 AM

## 2024-06-22 NOTE — Progress Notes (Signed)
 Initial Nutrition Assessment  DOCUMENTATION CODES:   Not applicable  INTERVENTION:  -Regular menu, regular texture, thin liquids -Add Ensure Plus High Protein BID -Add MVI -Pt requests cut up assistance with meals -Education on importance of adequate kcal/pro intake for healing   NUTRITION DIAGNOSIS:   Increased nutrient needs related to chronic illness as evidenced by estimated needs, edema.  GOAL:   Patient will meet greater than or equal to 90% of their needs  MONITOR:   PO intake, Weight trends, Supplement acceptance, Labs, Skin  REASON FOR ASSESSMENT:   Consult Assessment of nutrition requirement/status  ASSESSMENT:   Hx CAD (s/p PCI LAD 2000), PAF on Eliquis , chronic HFpEF, apical HCM, SSS s/p PPM, CKD stage IIIb, chronic hypercalcemia, HTN, HLD, chronic anemia, OSA who presented to the ED for evaluation of shortness of breath.  Spoke to pt at bedside. Pt denies n/v/c/d or chewing/swallowing difficulties. Last BM 8/17. Pt with improving appetite, now eating 100%. Pt states good appetite at baseline. Pt also endorses desirable weight gain prior to acute illness. +EPHP BID to continue to promote desirable weight gain. +MVI. 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 NFPE completed (see below), pt with mild muscle losses suspect r/t previous weight loss, age, decreased mobility. Discussed importance of adequate kcal/pro intake for healing, fueling body, maintaining mobility. Pt verbalizes understanding. Pt denies questions/concerns/needs at this time, will continue to monitor, RDN available prn.   Labs Na 134 BUN 27 Cr 1.29 Albumin 2.5 AST 43 GFR 41 H/H 9.9/30.5 TSH 4.672 T4 1.24  Medications  amiodarone   200 mg Oral BID   apixaban   2.5 mg Oral BID   isosorbide  mononitrate  30 mg Oral Daily   metoprolol  tartrate  25 mg Oral BID   sodium chloride  flush  3 mL Intravenous Q12H     NUTRITION - FOCUSED PHYSICAL  EXAM:  Flowsheet Row Most Recent Value  Orbital Region No depletion  Upper Arm Region Mild depletion  Thoracic and Lumbar Region No depletion  Buccal Region No depletion  Temple Region Mild depletion  Clavicle Bone Region Mild depletion  Clavicle and Acromion Bone Region Mild depletion  Scapular Bone Region Mild depletion  Dorsal Hand Mild depletion  Patellar Region Mild depletion  Anterior Thigh Region Mild depletion  Posterior Calf Region Mild depletion  Edema (RD Assessment) Mild  Hair Reviewed  Eyes Reviewed  Mouth Reviewed  Skin Reviewed  Nails Reviewed    Diet Order:   Diet Order             Diet regular Fluid consistency: Thin  Diet effective now                   EDUCATION NEEDS:   Education needs have been addressed  Skin:  Skin Assessment: Reviewed RN Assessment  Last BM:  8/17  Height:   Ht Readings from Last 1 Encounters:  06/21/24 5' 8 (1.727 m)    Weight:   Wt Readings from Last 1 Encounters:  06/21/24 58.9 kg   BMI:  Body mass index is 19.74 kg/m.  Estimated Nutritional Needs:   Kcal:  1475-1750 kcal  Protein:  70-90 g  Fluid:  >/=1.5L  Kervens Roper Daml-Budig, RDN, LDN Registered Dietitian Nutritionist RD Inpatient Contact Info in [place]

## 2024-06-22 NOTE — Telephone Encounter (Signed)
 Spoke and left a detailed message with [facility] agent to have [place] call the office back regarding this message

## 2024-06-22 NOTE — TOC Initial Note (Signed)
 [facility] - Initial/Assessment Note    Patient Details  Name: Barbara [name] MRN: [id number] Date of Birth: April 27, 1941  [facility]) CM/SW Contact:    Andrez JULIANNA George, RN Phone Number: 06/22/2024, 12:10 PM  Clinical Narrative:                  Pt is from home alone. States her son has arranged a caregiver 8a-4p M-F.  Her sister, Sister in Social worker, cousin all check on and provide transportation if needed.  Pt manages her own medications and denies any issues.  Awaiting therapy evals.  IP Care management following.  Expected Discharge Plan:  (TBD) Barriers to Discharge: Continued Medical Work up   Patient Goals and CMS Choice            Expected Discharge Plan and Services       Living arrangements for the past 2 months: Single Family Home                                      Prior Living Arrangements/Services Living arrangements for the past 2 months: Single Family Home Lives with:: Self Patient language and need for interpreter reviewed:: Yes Do you feel safe going back to the place where you live?: Yes          Current home services: DME (cane/ rollator/ upright rollator/ shower seat/ bars) Criminal Activity/Legal Involvement Pertinent to Current Situation/Hospitalization: No - Comment as needed  Activities of Daily Living   ADL Screening (condition at time of admission) Independently performs ADLs?: No Does the patient have a NEW difficulty with bathing/dressing/toileting/self-feeding that is expected to last >3 days?: No Does the patient have a NEW difficulty with getting in/out of bed, walking, or climbing stairs that is expected to last >3 days?: No Does the patient have a NEW difficulty with communication that is expected to last >3 days?: No Is the patient deaf or have difficulty hearing?: No Does the patient have difficulty seeing, even when wearing glasses/contacts?: No Does the patient have difficulty  concentrating, remembering, or making decisions?: No  Permission Sought/Granted                  Emotional Assessment Appearance:: Appears stated age Attitude/Demeanor/Rapport: Engaged Affect (typically observed): Accepting Orientation: : Oriented to Self, Oriented to Place, Oriented to  Time, Oriented to Situation   Psych Involvement: No (comment)  Admission diagnosis:  Atrial fibrillation with rapid ventricular response (HCC) [I48.91] Paroxysmal atrial fibrillation with rapid ventricular response (HCC) [I48.0] Dyspnea, unspecified type [R06.00] Patient Active Problem List   Diagnosis Date Noted   Paroxysmal atrial fibrillation with rapid ventricular response (HCC) 06/20/2024   Elevated troponin 05/18/2024   Acute on chronic diastolic congestive heart failure (HCC) 05/18/2024   Hypophosphatemia 05/18/2024   Hypokalemia 05/18/2024   Moderate protein malnutrition (HCC) 05/18/2024   Pancytopenia (HCC) 05/18/2024   Failure to thrive in adult 05/17/2024   Loss of appetite 05/04/2024   Hypercalcemia 04/06/2024   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   Normocytic anemia 03/09/2024   GI bleed 03/09/2024   ABLA (acute blood loss anemia) 03/09/2024   Abnormal weight loss 03/08/2024   Ankle edema, bilateral 11/19/2023   Chronic heart failure with preserved ejection fraction (HFpEF) (HCC) 10/22/2023   Closed displaced fracture of left femoral neck (HCC) 10/22/2023   Closed left subtrochanteric femur fracture (HCC) 09/26/2023   Bronchitis  08/04/2022   Ureteral stone 06/26/2022   Renal stones 06/26/2022   S/P cystoscopy 06/25/2022   Nephrolithiasis, 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, 5mm in size, leading to moderate left hydroureteronephrosis and significant left perinephric stranding /edema 06/09/2022   OSA (obstructive sleep apnea) 06/09/2022   Acquired trigger finger of right index finger 05/10/2021   Trigger thumb of left hand 05/10/2021   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   Carpal tunnel  syndrome of right wrist 02/07/2021   Persistent atrial fibrillation (HCC) 10/07/2020   Chronic kidney disease, stage 3b (HCC) 05/20/2020   Apical variant hypertrophic cardiomyopathy (HCC)    Chest pain of uncertain etiology    Chest pain    S/P reverse total shoulder arthroplasty, 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   Encounter for orthopedic follow-up care 02/18/2020   PAF (paroxysmal atrial fibrillation) (HCC) 02/28/2019   Demand myocardial infarction (HCC) 02/25/2019   Pain in left knee 01/06/2018   Dysphonia 12/29/2017   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, [place] - [address] [place] [address] Phone: [phone] Fax: [phone]  [facility] [address] [place] [address] Phone: [phone] Fax: [phone]     Social Drivers of Health (SDOH) Social History: SDOH Screenings   Food Insecurity: No Food Insecurity (06/20/2024)  Housing: High Risk (06/20/2024)  Transportation Needs: Unmet Transportation Needs (06/20/2024)  Utilities: Not At Risk (06/20/2024)  Alcohol Screen: Low Risk  (08/05/2022)  Depression (PHQ2-9): Low Risk  (04/05/2024)  Financial Resource Strain: Low Risk  (08/05/2022)  Physical Activity: Insufficiently Active (07/02/2022)  Social Connections: Moderately Integrated (06/20/2024)  Stress: No Stress Concern Present (07/02/2022)  Tobacco Use:  Medium Risk (06/20/2024)   SDOH Interventions:     Readmission Risk Interventions    05/24/2024    8:57 AM 03/11/2024    1:07 PM  Readmission Risk Prevention Plan  Transportation Screening Complete Complete  PCP or Specialist Appt within 5-7 Days  Complete  Home Care Screening  Complete  Medication Review (RN CM)  Complete  HRI or Home Care Consult Complete   Social Work Consult for Recovery Care Planning/Counseling Complete   Palliative Care Screening Not Applicable   Medication Review Oceanographer) Complete

## 2024-06-22 NOTE — Plan of Care (Signed)
  Problem: Education: Goal: Knowledge of General Education information will improve Description: Including pain rating scale, medication(s)/side effects and non-pharmacologic comfort measures Outcome: Progressing   Problem: Health Behavior/Discharge Planning: Goal: Ability to manage health-related needs will improve Outcome: Progressing   Problem: Clinical Measurements: Goal: Ability to maintain clinical measurements within normal limits will improve Outcome: Progressing Goal: Will remain free from infection Outcome: Progressing Goal: Diagnostic test results will improve Outcome: Progressing Goal: Respiratory complications will improve Outcome: Progressing Goal: Cardiovascular complication will be avoided Outcome: Progressing   Problem: Activity: Goal: Risk for activity intolerance will decrease Outcome: Progressing   Problem: Nutrition: Goal: Adequate nutrition will be maintained Outcome: Progressing   Problem: Coping: Goal: Level of anxiety will decrease Outcome: Progressing   Problem: Elimination: Goal: Will not experience complications related to bowel motility Outcome: Progressing   Problem: Skin Integrity: Goal: Risk for impaired skin integrity will decrease Outcome: Progressing   Problem: Education: Goal: Knowledge of disease or condition will improve Outcome: Progressing Goal: Understanding of medication regimen will improve Outcome: Progressing   Problem: Activity: Goal: Ability to tolerate increased activity will improve Outcome: Progressing   Problem: Cardiac: Goal: Ability to achieve and maintain adequate cardiopulmonary perfusion will improve Outcome: Progressing

## 2024-06-22 NOTE — Progress Notes (Signed)
 PROGRESS NOTE  ANSLEIGH SAFER FMW:[id number] DOB: 1941/01/08 DOA: 06/20/2024 PCP: Johnny Garnette LABOR, MD  HPI/Recap of past 24 hours: Barbara [name] is a 83 y.o. female with medical history significant for CAD (s/p PCI LAD 2000), PAF on Eliquis , chronic HFpEF, apical HCM, SSS s/p PPM, CKD stage IIIb, chronic hypercalcemia, HTN, HLD, chronic anemia, OSA who presented to the ED for evaluation of progressive SOB, palpitations, chest pain for the past couple of days, and unintentional weight loss for months. In the ED, VS with HR in the 160s, otherwise fairly stable. Labs showed troponin 408 > 398, BNP 980.3, calcium  11.2. SARS-CoV-2, influenza, RSV PCR negative.  UA negative for any infection. Portable chest x-ray negative for focal consolidation, edema, effusion. CTA chest negative for PE.  Cardiology consulted.  [facility] hospitalist admitted patient for further management. She was transferred to [facility] for cardioversion however she converted to sinus rhythm on amiodarone .   Subjective: Patient seen and examined.  She is sinus rhythm on the telemetry monitor.  Remains on amnio drip.  Patient does have some retrosternal discomfort and dry cough.  She has cough mostly after eating.  Had swallow test done last month that was negative for obvious aspiration, however she was swallowing cough after eating and drinking all consistencies.  We discussed about aspiration precautions, small bites, small sips and sitting upright all the time. Discussed with cardiology, they changed her to oral amiodarone . Discussed with patient's son, he thinks she is not safe to go home and wants to explore rehab versus long-term care.   Assessment/Plan: Principal Problem:   Paroxysmal atrial fibrillation with rapid ventricular response (HCC) Active Problems:   CAD (coronary artery disease/elevated troponin   Essential hypertension   Chronic kidney disease, stage 3b (HCC)   Hyperlipidemia   OSA (obstructive  sleep apnea)   Chronic heart failure with preserved ejection fraction (HFpEF) (HCC)   Hypercalcemia   Paroxysmal atrial fibrillation with RVR SSS s/p PPM Patient presenting with A-fib RVR, rate 160s TSH 4.6.  T4.1.24.  Not likely contributing. Seen by cardiology.  Treated with amiodarone  drip.  Now on oral amiodarone .  Initially planned for cardioversion, however she spontaneously converted to sinus rhythm. Oral amiodarone , metoprolol , therapeutic on Eliquis . Mobilize and monitor.   Chronic HFpEF BNP 980.3 TTE on 05/18/2024 showed systolic LV mid cavity obliteration with apical entrapment, no LV outflow obstruction.  EF >75% with hyperdynamic function.  No RWMA.  Severe asymmetric apical LVH, G2DD. Continue metoprolol  Cardiology following.  CAD s/p PCI History of PCI to LAD in 2000 Troponin elevated 408 > 398 Currently chest pain-free Likely demand ischemia in setting of A-fib RVR LHC 05/06/2022 showed mild-moderate nonobstructive CAD and 30% in-stent restenosis of LAD stent Continue metoprolol , Imdur  Continue Eliquis    Chronic hypercalcemia Patient with persistent hypercalcemia of unknown etiology.  Has had extensive prior workup.  Imaging including chest, abdomen, pelvis have been unrevealing.  She had negative workup for monoclonal gammopathy.  X-ray skeletal survey did not show evidence of bone lytic lesions.  PTH related peptide was normal Continue workup as outpatient   CKD stage IIIb Renal function stable Intermittent monitoring.   Hypertension Continue metoprolol  and Imdur .   Hyperlipidemia Does not appear to be on statin as an outpatient.   OSA Continue CPAP nightly  Hypoalbuminemia Noted significant weight loss, reports poor appetite Dietitian consulted      Estimated body mass index is 19.74 kg/m as calculated from the following:   Height as of this encounter:  5' 8 (1.727 m).   Weight as of this encounter: 58.9 kg.     Code Status: Full  Family  Communication: None at bedside.  Son Sallee on the phone called and updated.  Disposition Plan: Status is: Inpatient.  Amiodarone  infusion.  Safe discharge planning.      Consultants: Cardiology  Procedures: None  Antimicrobials: None  DVT prophylaxis: Eliquis    Objective: Vitals:   06/22/24 0109 06/22/24 0339 06/22/24 0539 06/22/24 0740  BP: (!) 110/52 (!) 122/40 (!) 137/50   Pulse: 60 (!) 58 60   Resp: 18 18 18 17   Temp: 98 F (36.7 C) 98.2 F (36.8 C) 98.2 F (36.8 C) 98.9 F (37.2 C)  TempSrc: Oral Oral Oral Oral  SpO2: 99% 98% 98%   Weight:      Height:        Intake/Output Summary (Last 24 hours) at 06/22/2024 1148 Last data filed at 06/22/2024 1048 Gross per 24 hour  Intake 1532.06 ml  Output 550 ml  Net 982.06 ml   Filed Weights   06/20/24 1535 06/20/24 2214 06/21/24 2039  Weight: 51.3 kg 58 kg 58.9 kg    Exam: General: Fairly comfortable.  Pleasant interactive. Cardiovascular: S1, S2 present.  Regular rate rhythm.  Pacemaker left precordium present. Respiratory: CTAB Abdomen: Soft, nontender, nondistended, bowel sounds present Musculoskeletal: No bilateral pedal edema noted Skin: Normal Psychiatry: Normal mood     Data Reviewed: CBC: Recent Labs  Lab 06/20/24 1538 06/21/24 0336 06/22/24 0529  WBC 4.0 3.4* 3.2*  NEUTROABS 3.1  --   --   HGB 11.7* 10.1* 9.9*  HCT 37.7 32.4* 30.5*  MCV 89.5 90.8 88.7  PLT 224 197 176   Basic Metabolic Panel: Recent Labs  Lab 06/18/24 1545 06/20/24 1538 06/21/24 0822 06/22/24 0529  NA 137 135 139 134*  K 4.2 3.5 3.5 3.5  CL 107 103 109 108  CO2 21 20* 23 22  GLUCOSE 103* 118* 92 88  BUN 30* 36* 29* 27*  CREATININE 1.30* 1.33* 1.30* 1.29*  CALCIUM  11.6* 11.2* 10.6* 10.1  MG 2.2 2.0  --   --   PHOS 2.6  --  2.7  --    GFR: Estimated Creatinine Clearance: 29.6 mL/min (A) (by C-G formula based on SCr of 1.29 mg/dL (H)). Liver Function Tests: Recent Labs  Lab 06/20/24 1538 06/21/24 0822   AST 43*  --   ALT 15  --   ALKPHOS 116  --   BILITOT 0.7  --   PROT 8.2*  --   ALBUMIN 2.9* 2.5*   No results for input(s): LIPASE, AMYLASE in the last 168 hours. No results for input(s): AMMONIA in the last 168 hours. Coagulation Profile: No results for input(s): INR, PROTIME in the last 168 hours. Cardiac Enzymes: No results for input(s): CKTOTAL, CKMB, CKMBINDEX, TROPONINI in the last 168 hours. BNP (last 3 results) No results for input(s): PROBNP in the last 8760 hours. HbA1C: No results for input(s): HGBA1C in the last 72 hours. CBG: No results for input(s): GLUCAP in the last 168 hours. Lipid Profile: No results for input(s): CHOL, HDL, LDLCALC, TRIG, CHOLHDL, LDLDIRECT in the last 72 hours. Thyroid  Function Tests: Recent Labs    06/21/24 0822 06/22/24 0529  TSH  --  4.672*  FREET4 1.24*  --    Anemia Panel: No results for input(s): VITAMINB12, FOLATE, FERRITIN, TIBC, IRON , RETICCTPCT in the last 72 hours. Urine analysis:    Component Value Date/Time   COLORURINE YELLOW  06/20/2024 2116   APPEARANCEUR HAZY (A) 06/20/2024 2116   LABSPEC 1.020 06/20/2024 2116   PHURINE 5.0 06/20/2024 2116   GLUCOSEU >=500 (A) 06/20/2024 2116   GLUCOSEU NEGATIVE 09/13/2021 1203   HGBUR NEGATIVE 06/20/2024 2116   HGBUR negative 10/17/2010 0859   BILIRUBINUR NEGATIVE 06/20/2024 2116   BILIRUBINUR neg 08/01/2021 1037   KETONESUR NEGATIVE 06/20/2024 2116   PROTEINUR NEGATIVE 06/20/2024 2116   UROBILINOGEN 0.2 09/13/2021 1203   NITRITE NEGATIVE 06/20/2024 2116   LEUKOCYTESUR NEGATIVE 06/20/2024 2116   Sepsis Labs: @LABRCNTIP (procalcitonin:4,lacticidven:4)  ) Recent Results (from the past 240 hours)  Resp panel by RT-PCR (RSV, Flu A&B, Covid) Anterior Nasal Swab     Status: None   Collection Time: 06/20/24  4:12 PM   Specimen: Anterior Nasal Swab  Result Value Ref Range Status   SARS Coronavirus 2 by RT PCR NEGATIVE NEGATIVE  Final    Comment: (NOTE) SARS-CoV-2 target nucleic acids are NOT DETECTED.  The SARS-CoV-2 RNA is generally detectable in upper respiratory specimens during the acute phase of infection. The lowest concentration of SARS-CoV-2 viral copies this assay can detect is 138 copies/mL. A negative result does not preclude SARS-Cov-2 infection and should not be used as the sole basis for treatment or other patient management decisions. A negative result may occur with  improper specimen collection/handling, submission of specimen other than nasopharyngeal swab, presence of viral mutation(s) within the areas targeted by this assay, and inadequate number of viral copies(<138 copies/mL). A negative result must be combined with clinical observations, patient history, and epidemiological information. The expected result is Negative.  Fact Sheet for Patients:  BloggerCourse.com  Fact Sheet for Healthcare Providers:  SeriousBroker.it  This test is no t yet approved or cleared by the [place]  FDA and  has been authorized for detection and/or diagnosis of SARS-CoV-2 by FDA under an Emergency Use Authorization (EUA). This EUA will remain  in effect (meaning this test can be used) for the duration of the COVID-19 declaration under Section 564(b)(1) of the Act, 21 U.S.C.section 360bbb-3(b)(1), unless the authorization is terminated  or revoked sooner.       Influenza A by PCR NEGATIVE NEGATIVE Final   Influenza B by PCR NEGATIVE NEGATIVE Final    Comment: (NOTE) The Xpert Xpress SARS-CoV-2/FLU/RSV plus assay is intended as an aid in the diagnosis of influenza from Nasopharyngeal swab specimens and should not be used as a sole basis for treatment. Nasal washings and aspirates are unacceptable for Xpert Xpress SARS-CoV-2/FLU/RSV testing.  Fact Sheet for Patients: BloggerCourse.com  Fact Sheet for Healthcare  Providers: SeriousBroker.it  This test is not yet approved or cleared by the [place]  FDA and has been authorized for detection and/or diagnosis of SARS-CoV-2 by FDA under an Emergency Use Authorization (EUA). This EUA will remain in effect (meaning this test can be used) for the duration of the COVID-19 declaration under Section 564(b)(1) of the Act, 21 U.S.C. section 360bbb-3(b)(1), unless the authorization is terminated or revoked.     Resp Syncytial Virus by PCR NEGATIVE NEGATIVE Final    Comment: (NOTE) Fact Sheet for Patients: BloggerCourse.com  Fact Sheet for Healthcare Providers: SeriousBroker.it  This test is not yet approved or cleared by the [place]  FDA and has been authorized for detection and/or diagnosis of SARS-CoV-2 by FDA under an Emergency Use Authorization (EUA). This EUA will remain in effect (meaning this test can be used) for the duration of the COVID-19 declaration under Section 564(b)(1) of the Act, 21 U.S.C. section  360bbb-3(b)(1), unless the authorization is terminated or revoked.  Performed at [facility], [address] [place], [place] [address]       Studies: No results found.   Scheduled Meds:  amiodarone   200 mg Oral BID   apixaban   2.5 mg Oral BID   isosorbide  mononitrate  30 mg Oral Daily   metoprolol  tartrate  25 mg Oral BID   sodium chloride  flush  3 mL Intravenous Q12H    Continuous Infusions:  sodium chloride        LOS: 1 day     Renato Applebaum, MD

## 2024-06-22 NOTE — Telephone Encounter (Signed)
 My intent was for home health to provide services in her own home

## 2024-06-23 ENCOUNTER — Telehealth: Payer: Self-pay

## 2024-06-23 DIAGNOSIS — I480 Paroxysmal atrial fibrillation: Secondary | ICD-10-CM | POA: Diagnosis not present

## 2024-06-23 NOTE — [place] FL2 (Signed)
 [place]  MEDICAID FL2 LEVEL OF CARE FORM     IDENTIFICATION  Patient Name: Barbara [name] Birthdate: 16-Sep-1941 Sex: female Admission Date (Current Location): 06/20/2024  [facility] and [place] Number:  Producer, television/film/video and Address:  [facility], [address] [place], [place] [address]      Provider Number: [id number]  Attending Physician Name and Address:  Raenelle Coria, MD  Relative Name and Phone Number:  Tilia Faso (son) [phone]    Current Level of Care: Hospital Recommended Level of Care: Skilled Nursing Facility Prior Approval Number:    Date Approved/Denied:   PASRR Number: [id number]  Discharge Plan: SNF    Current Diagnoses: Patient Active Problem List   Diagnosis Date Noted   Paroxysmal atrial fibrillation with rapid ventricular response (HCC) 06/20/2024   Elevated troponin 05/18/2024   Acute on chronic diastolic congestive heart failure (HCC) 05/18/2024   Hypophosphatemia 05/18/2024   Hypokalemia 05/18/2024   Moderate protein malnutrition (HCC) 05/18/2024   Pancytopenia (HCC) 05/18/2024   Failure to thrive in adult 05/17/2024   Loss of appetite 05/04/2024   Hypercalcemia 04/06/2024   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   Normocytic anemia 03/09/2024   GI bleed 03/09/2024   ABLA (acute blood loss anemia) 03/09/2024   Abnormal weight loss 03/08/2024   Ankle edema, bilateral 11/19/2023   Chronic heart failure with preserved ejection fraction (HFpEF) (HCC) 10/22/2023   Closed displaced fracture of left femoral neck (HCC) 10/22/2023   Closed left subtrochanteric femur fracture (HCC) 09/26/2023   Bronchitis 08/04/2022   Ureteral stone 06/26/2022   Renal stones 06/26/2022   S/P cystoscopy 06/25/2022   Nephrolithiasis, 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, 5mm in size, leading to moderate left hydroureteronephrosis and significant left perinephric stranding /edema 06/09/2022   OSA (obstructive sleep apnea) 06/09/2022    Acquired trigger finger of right index finger 05/10/2021   Trigger thumb of left hand 05/10/2021   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   Persistent atrial fibrillation (HCC) 10/07/2020   Chronic kidney disease, stage 3b (HCC) 05/20/2020   Apical variant hypertrophic cardiomyopathy (HCC)    Chest pain of uncertain etiology    Chest pain    S/P reverse total shoulder arthroplasty, 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   Encounter for orthopedic follow-up care 02/18/2020   PAF (paroxysmal atrial fibrillation) (HCC) 02/28/2019   Demand myocardial infarction (HCC) 02/25/2019   Pain in left knee 01/06/2018   Dysphonia 12/29/2017   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   Gastroesophageal reflux disease 11/06/2017   Bradycardia 12/27/2015   Exertional dyspnea 12/23/2014   Sinus bradycardia 09/13/2014   LUMBAGO 05/03/2010   VERTIGO 01/21/2009   TEMPOROMANDIBULAR JOINT PAIN 08/31/2008   Osteoarthritis 05/31/2008   TROCHANTERIC BURSITIS 05/31/2008   Essential hypertension 04/06/2007   CAD (coronary artery disease/elevated troponin 04/06/2007   ALLERGIC RHINITIS 04/06/2007   History of colonic polyps 04/06/2007   Hyperlipidemia 04/06/2007    Orientation RESPIRATION BLADDER Height & Weight     Self, Time, Situation, Place  Normal Continent, External catheter (External Urinary Catheter) Weight: 129 lb 13.6 oz (58.9 kg) Height:  5' 8 (172.7 cm)  BEHAVIORAL SYMPTOMS/MOOD NEUROLOGICAL BOWEL NUTRITION STATUS        Diet (Please see discharge summary)  AMBULATORY STATUS COMMUNICATION OF NEEDS Skin     Verbally Other (Comment) (WDL,Wound/Incision LDAs)  Personal Care Assistance Level of Assistance  Bathing, Feeding, Dressing Bathing Assistance: Limited assistance Feeding assistance: Independent Dressing Assistance: Limited assistance     Functional Limitations Info  Sight, Hearing, Speech Sight Info: Impaired Financial trader) Hearing  Info: Adequate Speech Info: Adequate    SPECIAL CARE FACTORS FREQUENCY  PT (By licensed PT), OT (By licensed OT)     PT Frequency: 5x min weekly OT Frequency: 5x min weekly            Contractures Contractures Info: Not present    Additional Factors Info  Code Status, Allergies Code Status Info: FULL Allergies Info: Lidocaine ,Morphine ,Procaine Hcl,Sulfonamide Derivatives,Amlodipine ,Ezetimibe-simvastatin,Latex,Norco (hydrocodone -acetaminophen ),Tape,Tramadol            Current Medications (06/23/2024):  This is the current hospital active medication list Current Facility-Administered Medications  Medication Dose Route Frequency Provider Last Rate Last Admin   acetaminophen  (TYLENOL ) tablet 650 mg  650 mg Oral Q6H PRN Patel, Vishal R, MD       Or   acetaminophen  (TYLENOL ) suppository 650 mg  650 mg Rectal Q6H PRN Patel, Vishal R, MD       amiodarone  (PACERONE ) tablet 200 mg  200 mg Oral BID Nishan, Peter C, MD   200 mg at 06/23/24 9091   apixaban  (ELIQUIS ) tablet 2.5 mg  2.5 mg Oral BID Patel, Vishal R, MD   2.5 mg at 06/23/24 0908   bisacodyl  (DULCOLAX) EC tablet 5 mg  5 mg Oral Daily PRN Patel, Vishal R, MD       feeding supplement (ENSURE PLUS HIGH PROTEIN) liquid 237 mL  237 mL Oral BID BM Raenelle Coria, MD   237 mL at 06/23/24 0908   isosorbide  mononitrate (IMDUR ) 24 hr tablet 30 mg  30 mg Oral Daily Patel, Vishal R, MD   30 mg at 06/23/24 9092   metoprolol  tartrate (LOPRESSOR ) tablet 25 mg  25 mg Oral BID Adams, Zane, PA-C   25 mg at 06/23/24 9092   multivitamin with minerals tablet 1 tablet  1 tablet Oral Daily Raenelle Coria, MD   1 tablet at 06/23/24 9092   ondansetron  (ZOFRAN ) tablet 4 mg  4 mg Oral Q6H PRN Patel, Vishal R, MD       Or   ondansetron  (ZOFRAN ) injection 4 mg  4 mg Intravenous Q6H PRN Patel, Vishal R, MD       senna-docusate (Senokot-S) tablet 1 tablet  1 tablet Oral QHS PRN Patel, Vishal R, MD       sodium chloride  flush (NS) 0.9 % injection 3 mL  3 mL  Intravenous Q12H Patel, Vishal R, MD   3 mL at 06/23/24 0908     Discharge Medications: Please see discharge summary for a list of discharge medications.  Relevant Imaging Results:  Relevant Lab Results:   Additional Information [id number]  Isaiah Public, LCSWA

## 2024-06-23 NOTE — Telephone Encounter (Signed)
 This message has been completed  Copied from CRM #[id number]. Topic: Clinical - Medical Advice >> Jun 18, 2024  4:15 PM Zy'onna H wrote: Reason for CRM:  Patient representative (Son)  Spoke to MD Johnny earlier today- regarding paperwork he sent in on patients behalf and wants to speak to MD regarding the work order put in for the patient.   Work order regarding: [employer]  Callback Number: [phone]  Please call the patient representative ASAP

## 2024-06-23 NOTE — Progress Notes (Signed)
 PROGRESS NOTE  Barbara [name] FMW:[id number] DOB: 01-Jun-1941 DOA: 06/20/2024 PCP: Johnny Garnette LABOR, MD  HPI/Recap of past 24 hours: Barbara [name] is a 83 y.o. female with medical history significant for CAD (s/p PCI LAD 2000), PAF on Eliquis , chronic HFpEF, apical HCM, SSS s/p PPM, CKD stage IIIb, chronic hypercalcemia, HTN, HLD, chronic anemia, OSA who presented to the ED for evaluation of progressive SOB, palpitations, chest pain for the past couple of days, and unintentional weight loss for months. In the ED, VS with HR in the 160s, otherwise fairly stable. Labs showed troponin 408 > 398, BNP 980.3, calcium  11.2. SARS-CoV-2, influenza, RSV PCR negative.  UA negative for any infection. Portable chest x-ray negative for focal consolidation, edema, effusion. CTA chest negative for PE.  Cardiology consulted.  [facility] hospitalist admitted patient for further management. She was transferred to [facility] for cardioversion however she converted to sinus rhythm on amiodarone .   Subjective: Patient seen and examined.  No overnight events.  Difficult to mobilize.  Agrees about rehab.  She  reluctantly agrees that she may have to move to an assisted living facility or nursing home in the near future.   Assessment/Plan: Principal Problem:   Paroxysmal atrial fibrillation with rapid ventricular response (HCC) Active Problems:   CAD (coronary artery disease/elevated troponin   Essential hypertension   Chronic kidney disease, stage 3b (HCC)   Hyperlipidemia   OSA (obstructive sleep apnea)   Chronic heart failure with preserved ejection fraction (HFpEF) (HCC)   Hypercalcemia   Paroxysmal atrial fibrillation with RVR SSS s/p PPM Patient presenting with A-fib RVR, rate 160s TSH 4.6.  T4.1.24.  Not likely contributing. Seen by cardiology.  Treated with amiodarone  drip.  Now on oral amiodarone .  Initially planned for cardioversion, however she spontaneously converted to sinus rhythm. Oral  amiodarone , metoprolol , therapeutic on Eliquis . Mobilize and monitor.   Chronic HFpEF BNP 980.3 TTE on 05/18/2024 showed systolic LV mid cavity obliteration with apical entrapment, no LV outflow obstruction.  EF >75% with hyperdynamic function.  No RWMA.  Severe asymmetric apical LVH, G2DD. Continue metoprolol  Cardiology following.  CAD s/p PCI History of PCI to LAD in 2000 Troponin elevated 408 > 398 Currently chest pain-free Likely demand ischemia in setting of A-fib RVR LHC 05/06/2022 showed mild-moderate nonobstructive CAD and 30% in-stent restenosis of LAD stent Continue metoprolol , Imdur  Continue Eliquis    Chronic hypercalcemia Patient with persistent hypercalcemia of unknown etiology.  Has had extensive prior workup.  Imaging including chest, abdomen, pelvis have been unrevealing.  She had negative workup for monoclonal gammopathy.  X-ray skeletal survey did not show evidence of bone lytic lesions.  PTH related peptide was normal Continue workup as outpatient   CKD stage IIIb Renal function stable Intermittent monitoring.   Hypertension Continue metoprolol  and Imdur .   Hyperlipidemia Does not appear to be on statin as an outpatient.   OSA Continue CPAP nightly  Hypoalbuminemia Noted significant weight loss, reports poor appetite Dietitian following.  Added nutrition.      Estimated body mass index is 19.74 kg/m as calculated from the following:   Height as of this encounter: 5' 8 (1.727 m).   Weight as of this encounter: 58.9 kg.     Code Status: Full  Family Communication: None at bedside.  Son Sallee on the phone called and updated 8/19.  Disposition Plan: Status is: Inpatient.  Medically stabilized.  Needs skilled nursing facility placement.      Consultants: Cardiology  Procedures: None  Antimicrobials:  None  DVT prophylaxis: Eliquis    Objective: Vitals:   06/23/24 0815 06/23/24 0832 06/23/24 0838 06/23/24 0907  BP: (!) 144/82 (!)  157/74 (!) 170/82 (!) 170/82  Pulse: 60 (!) 54 60 65  Resp:      Temp:      TempSrc:      SpO2: 96% 97% 94%   Weight:      Height:        Intake/Output Summary (Last 24 hours) at 06/23/2024 1052 Last data filed at 06/23/2024 0600 Gross per 24 hour  Intake 3 ml  Output 900 ml  Net -897 ml   Filed Weights   06/20/24 1535 06/20/24 2214 06/21/24 2039  Weight: 51.3 kg 58 kg 58.9 kg    General: Alert awake and oriented.  Pleasant interactive. Cardiovascular: S1-S2 normal.  Regular rate rhythm. Respiratory: Bilateral clear.  No added sounds. Gastrointestinal: Soft.  Nontender.  Bowel sound present. Ext: No swelling or edema.  No cyanosis. Neuro: Alert awake.  Gross generalized weakness. Musculoskeletal: No deformities.     Data Reviewed: CBC: Recent Labs  Lab 06/20/24 1538 06/21/24 0336 06/22/24 0529  WBC 4.0 3.4* 3.2*  NEUTROABS 3.1  --   --   HGB 11.7* 10.1* 9.9*  HCT 37.7 32.4* 30.5*  MCV 89.5 90.8 88.7  PLT 224 197 176   Basic Metabolic Panel: Recent Labs  Lab 06/18/24 1545 06/20/24 1538 06/21/24 0822 06/22/24 0529  NA 137 135 139 134*  K 4.2 3.5 3.5 3.5  CL 107 103 109 108  CO2 21 20* 23 22  GLUCOSE 103* 118* 92 88  BUN 30* 36* 29* 27*  CREATININE 1.30* 1.33* 1.30* 1.29*  CALCIUM  11.6* 11.2* 10.6* 10.1  MG 2.2 2.0  --   --   PHOS 2.6  --  2.7  --    GFR: Estimated Creatinine Clearance: 29.6 mL/min (A) (by C-G formula based on SCr of 1.29 mg/dL (H)). Liver Function Tests: Recent Labs  Lab 06/20/24 1538 06/21/24 0822  AST 43*  --   ALT 15  --   ALKPHOS 116  --   BILITOT 0.7  --   PROT 8.2*  --   ALBUMIN 2.9* 2.5*   No results for input(s): LIPASE, AMYLASE in the last 168 hours. No results for input(s): AMMONIA in the last 168 hours. Coagulation Profile: No results for input(s): INR, PROTIME in the last 168 hours. Cardiac Enzymes: No results for input(s): CKTOTAL, CKMB, CKMBINDEX, TROPONINI in the last 168 hours. BNP  (last 3 results) No results for input(s): PROBNP in the last 8760 hours. HbA1C: No results for input(s): HGBA1C in the last 72 hours. CBG: No results for input(s): GLUCAP in the last 168 hours. Lipid Profile: No results for input(s): CHOL, HDL, LDLCALC, TRIG, CHOLHDL, LDLDIRECT in the last 72 hours. Thyroid  Function Tests: Recent Labs    06/21/24 0822 06/22/24 0529  TSH  --  4.672*  FREET4 1.24*  --    Anemia Panel: No results for input(s): VITAMINB12, FOLATE, FERRITIN, TIBC, IRON , RETICCTPCT in the last 72 hours. Urine analysis:    Component Value Date/Time   COLORURINE YELLOW 06/20/2024 2116   APPEARANCEUR HAZY (A) 06/20/2024 2116   LABSPEC 1.020 06/20/2024 2116   PHURINE 5.0 06/20/2024 2116   GLUCOSEU >=500 (A) 06/20/2024 2116   GLUCOSEU NEGATIVE 09/13/2021 1203   HGBUR NEGATIVE 06/20/2024 2116   HGBUR negative 10/17/2010 0859   BILIRUBINUR NEGATIVE 06/20/2024 2116   BILIRUBINUR neg 08/01/2021 1037   KETONESUR NEGATIVE 06/20/2024  2116   PROTEINUR NEGATIVE 06/20/2024 2116   UROBILINOGEN 0.2 09/13/2021 1203   NITRITE NEGATIVE 06/20/2024 2116   LEUKOCYTESUR NEGATIVE 06/20/2024 2116   Sepsis Labs: @LABRCNTIP (procalcitonin:4,lacticidven:4)  ) Recent Results (from the past 240 hours)  Resp panel by RT-PCR (RSV, Flu A&B, Covid) Anterior Nasal Swab     Status: None   Collection Time: 06/20/24  4:12 PM   Specimen: Anterior Nasal Swab  Result Value Ref Range Status   SARS Coronavirus 2 by RT PCR NEGATIVE NEGATIVE Final    Comment: (NOTE) SARS-CoV-2 target nucleic acids are NOT DETECTED.  The SARS-CoV-2 RNA is generally detectable in upper respiratory specimens during the acute phase of infection. The lowest concentration of SARS-CoV-2 viral copies this assay can detect is 138 copies/mL. A negative result does not preclude SARS-Cov-2 infection and should not be used as the sole basis for treatment or other patient management decisions. A  negative result may occur with  improper specimen collection/handling, submission of specimen other than nasopharyngeal swab, presence of viral mutation(s) within the areas targeted by this assay, and inadequate number of viral copies(<138 copies/mL). A negative result must be combined with clinical observations, patient history, and epidemiological information. The expected result is Negative.  Fact Sheet for Patients:  BloggerCourse.com  Fact Sheet for Healthcare Providers:  SeriousBroker.it  This test is no t yet approved or cleared by the [place]  FDA and  has been authorized for detection and/or diagnosis of SARS-CoV-2 by FDA under an Emergency Use Authorization (EUA). This EUA will remain  in effect (meaning this test can be used) for the duration of the COVID-19 declaration under Section 564(b)(1) of the Act, 21 U.S.C.section 360bbb-3(b)(1), unless the authorization is terminated  or revoked sooner.       Influenza A by PCR NEGATIVE NEGATIVE Final   Influenza B by PCR NEGATIVE NEGATIVE Final    Comment: (NOTE) The Xpert Xpress SARS-CoV-2/FLU/RSV plus assay is intended as an aid in the diagnosis of influenza from Nasopharyngeal swab specimens and should not be used as a sole basis for treatment. Nasal washings and aspirates are unacceptable for Xpert Xpress SARS-CoV-2/FLU/RSV testing.  Fact Sheet for Patients: BloggerCourse.com  Fact Sheet for Healthcare Providers: SeriousBroker.it  This test is not yet approved or cleared by the [place]  FDA and has been authorized for detection and/or diagnosis of SARS-CoV-2 by FDA under an Emergency Use Authorization (EUA). This EUA will remain in effect (meaning this test can be used) for the duration of the COVID-19 declaration under Section 564(b)(1) of the Act, 21 U.S.C. section 360bbb-3(b)(1), unless the authorization  is terminated or revoked.     Resp Syncytial Virus by PCR NEGATIVE NEGATIVE Final    Comment: (NOTE) Fact Sheet for Patients: BloggerCourse.com  Fact Sheet for Healthcare Providers: SeriousBroker.it  This test is not yet approved or cleared by the [place]  FDA and has been authorized for detection and/or diagnosis of SARS-CoV-2 by FDA under an Emergency Use Authorization (EUA). This EUA will remain in effect (meaning this test can be used) for the duration of the COVID-19 declaration under Section 564(b)(1) of the Act, 21 U.S.C. section 360bbb-3(b)(1), unless the authorization is terminated or revoked.  Performed at [facility], [address], [place], [place] [address]       Studies: No results found.   Scheduled Meds:  amiodarone   200 mg Oral BID   apixaban   2.5 mg Oral BID   feeding supplement  237 mL Oral BID BM  isosorbide  mononitrate  30 mg Oral Daily   metoprolol  tartrate  25 mg Oral BID   multivitamin with minerals  1 tablet Oral Daily   sodium chloride  flush  3 mL Intravenous Q12H    Continuous Infusions:     LOS: 2 days     Renato Applebaum, MD

## 2024-06-23 NOTE — Telephone Encounter (Signed)
 Christina's message Walterine Mom, we received a [facility] referral from Dr. Mira office. We are following at hospital for [facility] once they dc

## 2024-06-23 NOTE — Evaluation (Signed)
 Physical Therapy Evaluation Patient Details Name: Barbara [name] MRN: [id number] DOB: [birth year] Today's Date: 06/23/2024  History of Present Illness  The pt is an 83 yo female adm 06/20/24 with SOB, chest pain, AFib with RVR. PMH: CAD, HFpEF, A-fib on Eliquis , essential HTN, HLD, anxiety, CKD, SSS s/p PPM, anemia  Clinical Impression  Pt in bed upon arrival of PT, agreeable to evaluation at this time. Prior to admission the pt reports gradual decline in mobility, progressing from independent with use of cane in the home and rollator in community to use of RW and assist from aide for all mobility in the home just before admission. The pt reports aide present from 8am-12pm and 4pm-8pm, but per chart aide is present from 8am-4pm daily. The pt currently needs modA to complete bed mobility, and minA-CGA to complete OOB mobility and ambulation. She had x2 posterior LOB with gait needing minA to recover and minA intermittently to navigate around furniture in the room. Pt with limited LE power, stability, and endurance. Will benefit from continued skilled PT acutely and after d/c, recommend continued inpatient therapies <3 hours/day until able to return home with intermittent assist unless family is able to provide constant supervision and be able to provide minA to assist pt with all OOB mobility as she is at high risk of falls.      If plan is discharge home, recommend the following: A little help with walking and/or transfers;A little help with bathing/dressing/bathroom;Supervision due to cognitive status;Assistance with cooking/housework;Direct supervision/assist for medications management;Direct supervision/assist for financial management;Assist for transportation;Help with stairs or ramp for entrance   Can travel by private vehicle   Yes    Equipment Recommendations None recommended by PT  Recommendations for Other Services       Functional Status Assessment Patient has had a recent  decline in their functional status and demonstrates the ability to make significant improvements in function in a reasonable and predictable amount of time.     Precautions / Restrictions Precautions Precautions: Fall Recall of Precautions/Restrictions: Impaired Restrictions Weight Bearing Restrictions Per Provider Order: No      Mobility  Bed Mobility Overal bed mobility: Needs Assistance Bed Mobility: Supine to Sit     Supine to sit: Mod assist, HOB elevated, Used rails     General bed mobility comments: modA to pull to EOB, minA to scoot. minA initially to maintain sitting EOB    Transfers Overall transfer level: Needs assistance Equipment used: Rolling walker (2 wheels) Transfers: Sit to/from Stand, Bed to chair/wheelchair/BSC Sit to Stand: Min assist   Step pivot transfers: Min assist       General transfer comment: minA to power up and prevent posterior LOB. minA to manage RW and balance with pivot. increased cues for stepping, completing turn, and line management. poor eccentric lower    Ambulation/Gait Ambulation/Gait assistance: Contact guard assist, Min assist Gait Distance (Feet): 15 Feet (+ 25 ft) Assistive device: Rolling walker (2 wheels) Gait Pattern/deviations: Step-through pattern, Decreased stride length, Shuffle, Leaning posteriorly Gait velocity: decreased Gait velocity interpretation: <1.31 ft/sec, indicative of household ambulator   General Gait Details: pt with x2 posterior LOB needing minA. then assist to manage navigating around obstacles. Pt reports chest pain with SOB x3, SpO2 stable on RA    Balance Overall balance assessment: Needs assistance Sitting-balance support: No upper extremity supported, Feet supported Sitting balance-Leahy Scale: Fair Sitting balance - Comments: minA initially to maintain static sitting, progressed to CGA Postural control: Posterior lean Standing balance  support: Bilateral upper extremity supported, During  functional activity Standing balance-Leahy Scale: Poor Standing balance comment: x2 posterior LOB with gait, then CGA                             Pertinent Vitals/Pain Pain Assessment Pain Assessment: Faces Faces Pain Scale: Hurts little more Pain Location: chest with SOB Pain Descriptors / Indicators: Discomfort, Grimacing Pain Intervention(s): Limited activity within patient's tolerance, Monitored during session, Repositioned    Home Living Family/patient expects to be discharged to:: Private residence Living Arrangements: Alone Available Help at Discharge: Family;Available PRN/intermittently;Personal care attendant Type of Home: House Home Access: Stairs to enter Entrance Stairs-Rails: Right Entrance Stairs-Number of Steps: 2   Home Layout: One level Home Equipment: Rollator (4 wheels);Rolling Walker (2 wheels);Cane - quad;Shower seat - built in;BSC/3in1;Hand held shower head;Grab bars - tub/shower (Upright walker for church) Additional Comments: Has a PCA who assists with ADLs and mobility    Prior Function Prior Level of Function : Needs assist             Mobility Comments: RW in the home, has become less and less and mobile over the last two weeks ADLs Comments: Neeed assist for all ADLs, PCA provides     Extremity/Trunk Assessment   Upper Extremity Assessment Upper Extremity Assessment: Defer to OT evaluation    Lower Extremity Assessment Lower Extremity Assessment: Generalized weakness    Cervical / Trunk Assessment Cervical / Trunk Assessment: Kyphotic;Other exceptions Cervical / Trunk Exceptions: frail  Communication   Communication Communication: No apparent difficulties    Cognition Arousal: Alert Behavior During Therapy: WFL for tasks assessed/performed   PT - Cognitive impairments: No family/caregiver present to determine baseline, Initiation, Problem solving, Safety/Judgement, Attention                          Following commands: Impaired Following commands impaired: Follows one step commands with increased time     Cueing Cueing Techniques: Verbal cues, Gestural cues     General Comments General comments (skin integrity, edema, etc.): VSS on RA    Exercises     Assessment/Plan    PT Assessment Patient needs continued PT services  PT Problem List Decreased strength;Decreased activity tolerance;Decreased balance;Decreased mobility;Decreased cognition;Decreased safety awareness;Cardiopulmonary status limiting activity       PT Treatment Interventions DME instruction;Gait training;Stair training;Functional mobility training;Therapeutic activities;Therapeutic exercise;Balance training;Patient/family education    PT Goals (Current goals can be found in the Care Plan section)  Acute Rehab PT Goals Patient Stated Goal: to return home PT Goal Formulation: With patient Time For Goal Achievement: 07/07/24 Potential to Achieve Goals: Good    Frequency Min 2X/week        AM-PAC PT 6 Clicks Mobility  Outcome Measure Help needed turning from your back to your side while in a flat bed without using bedrails?: A Little Help needed moving from lying on your back to sitting on the side of a flat bed without using bedrails?: A Lot Help needed moving to and from a bed to a chair (including a wheelchair)?: A Little Help needed standing up from a chair using your arms (e.g., wheelchair or bedside chair)?: A Little Help needed to walk in hospital room?: A Little Help needed climbing 3-5 steps with a railing? : A Lot 6 Click Score: 16    End of Session Equipment Utilized During Treatment: Gait belt Activity Tolerance: Patient tolerated treatment  well Patient left: in chair;with call bell/phone within reach;with chair alarm set Nurse Communication: Mobility status PT Visit Diagnosis: Unsteadiness on feet (R26.81);Muscle weakness (generalized) (M62.81)    Time: 0827-0903 PT Time Calculation  (min) (ACUTE ONLY): 36 min   Charges:   PT Evaluation $PT Eval Low Complexity: 1 Low   PT General Charges $$ ACUTE PT VISIT: 1 Visit         Izetta Call, PT, DPT   Acute Rehabilitation Department Office [phone] Secure Chat Communication Preferred  Izetta JULIANNA Call 06/23/2024, 11:13 AM

## 2024-06-23 NOTE — Progress Notes (Signed)
 Occupational Therapy Treatment Patient Details Name: Barbara [name] MRN: [id number] DOB: [birth year] Today's Date: 06/23/2024   History of present illness The pt is an 83 yo female adm 06/20/24 with SOB, chest pain, AFib with RVR. PMH: CAD, HFpEF, A-fib on Eliquis , essential HTN, HLD, anxiety, CKD, SSS s/p PPM, anemia   OT comments  Prior to this admission, patient living alone, with PCA that would come on weekdays to assist with ADLs and functional mobility. Patient uses a RW/rollator at baseline, and inconsistent story provided if she still drives. Currently, patient with elevated BP, cognitive deficits, need for increased assist for ADLs, and decreased functional mobility. Patient mod A for bed mobility, min to mod A for ADL management, and min A for transfers. Due to patient requiring increased support, OT is recommending a short stint at rehab of lesser intensity < 3 hours prior to return home. OT will continue to follow.       If plan is discharge home, recommend the following:  A little help with walking and/or transfers;A lot of help with bathing/dressing/bathroom;Assistance with cooking/housework;Direct supervision/assist for medications management;Direct supervision/assist for financial management;Assist for transportation;Help with stairs or ramp for entrance;Supervision due to cognitive status (initially)   Equipment Recommendations  Other (comment) (defer to next venue)    Recommendations for Other Services      Precautions / Restrictions Precautions Precautions: Fall Recall of Precautions/Restrictions: Impaired Restrictions Weight Bearing Restrictions Per Provider Order: No       Mobility Bed Mobility Overal bed mobility: Needs Assistance Bed Mobility: Supine to Sit     Supine to sit: Mod assist, HOB elevated, Used rails     General bed mobility comments: modA to pull to EOB, minA to scoot. minA initially to maintain sitting EOB    Transfers Overall  transfer level: Needs assistance Equipment used: Rolling walker (2 wheels) Transfers: Sit to/from Stand, Bed to chair/wheelchair/BSC Sit to Stand: Min assist     Step pivot transfers: Min assist     General transfer comment: minA to power up and prevent posterior LOB. minA to manage RW and balance with pivot. increased cues for stepping, completing turn, and line management. poor eccentric lower     Balance Overall balance assessment: Needs assistance Sitting-balance support: No upper extremity supported, Feet supported Sitting balance-Leahy Scale: Fair Sitting balance - Comments: minA initially to maintain static sitting, progressed to CGA Postural control: Posterior lean Standing balance support: Bilateral upper extremity supported, During functional activity Standing balance-Leahy Scale: Poor Standing balance comment: x2 posterior LOB with gait, then CGA                           ADL either performed or assessed with clinical judgement   ADL Overall ADL's : Needs assistance/impaired Eating/Feeding: Set up;Sitting   Grooming: Set up;Sitting   Upper Body Bathing: Contact guard assist;Sitting   Lower Body Bathing: Minimal assistance;Moderate assistance;Sitting/lateral leans;Sit to/from stand   Upper Body Dressing : Contact guard assist;Sitting   Lower Body Dressing: Minimal assistance;Moderate assistance;Sit to/from stand;Sitting/lateral leans   Toilet Transfer: Minimal assistance;Moderate assistance;Ambulation;Regular Toilet;Rolling walker (2 wheels);Grab bars Toilet Transfer Details (indicate cue type and reason): cues for RW management, poor eccentric control Toileting- Clothing Manipulation and Hygiene: Total assistance;Sit to/from stand;Sitting/lateral lean Toileting - Clothing Manipulation Details (indicate cue type and reason): unable to complete in standing     Functional mobility during ADLs: Minimal assistance;Cueing for sequencing;Cueing for  safety;Rolling walker (2 wheels) General ADL Comments: Prior  to this admission, patient living alone, with PCA that would come on weekdays to assist with ADLs and functional mobility. Patient uses a RW/rollator at baseline, and inconsistent story provided if she still drives. Currently, patient with elevated BP, cognitive deficits, need for increased assist for ADLs, and decreased functional mobility. Patient mod A for bed mobility, min to mod A for ADL management, and min A for transfers. Due to patient requiring increased support, OT is recommending a short stint at rehab of lesser intensity < 3 hours prior to return home. OT will continue to follow.    Extremity/Trunk Assessment Upper Extremity Assessment Upper Extremity Assessment: Generalized weakness;Right hand dominant   Lower Extremity Assessment Lower Extremity Assessment: Defer to PT evaluation   Cervical / Trunk Assessment Cervical / Trunk Assessment: Kyphotic;Other exceptions Cervical / Trunk Exceptions: frail    Vision Ability to See in Adequate Light: 0 Adequate Patient Visual Report: No change from baseline Vision Assessment?: No apparent visual deficits   Perception Perception Perception: Not tested   Praxis Praxis Praxis: Not tested   Communication Communication Communication: No apparent difficulties   Cognition Arousal: Alert Behavior During Therapy: WFL for tasks assessed/performed Cognition: Cognition impaired     Awareness: Intellectual awareness impaired, Online awareness impaired Memory impairment (select all impairments): Short-term memory, Working memory Attention impairment (select first level of impairment): Sustained attention Executive functioning impairment (select all impairments): Initiation, Organization, Sequencing, Problem solving OT - Cognition Comments: Patient oriented, but then had components of her storyline that did not add up Ive been in bed for 2 weeks but then stating she walks with  her caretaker daily and participates in all her ADLs                 Following commands: Impaired Following commands impaired: Follows one step commands with increased time      Cueing   Cueing Techniques: Verbal cues, Gestural cues  Exercises      Shoulder Instructions       General Comments      Pertinent Vitals/ Pain       Pain Assessment Pain Assessment: Faces Faces Pain Scale: Hurts little more Pain Location: chest with SOB Pain Descriptors / Indicators: Discomfort, Grimacing Pain Intervention(s): Limited activity within patient's tolerance, Monitored during session, Repositioned  Home Living Family/patient expects to be discharged to:: Private residence Living Arrangements: Alone Available Help at Discharge: Family;Available PRN/intermittently;Personal care attendant Type of Home: House Home Access: Stairs to enter [employer] of Steps: 2 Entrance Stairs-Rails: Right Home Layout: One level     Bathroom Shower/Tub: Producer, television/film/video: Handicapped height Bathroom Accessibility: Yes   Home Equipment: Rollator (4 wheels);Rolling Walker (2 wheels);Cane - quad;Shower seat - built in;BSC/3in1;Hand held shower head;Grab bars - tub/shower (Upright walker for church)   Additional Comments: Has a PCA who assists with ADLs and mobility      Prior Functioning/Environment              Frequency  Min 2X/week        Progress Toward Goals  OT Goals(current goals can now be found in the care plan section)     Acute Rehab OT Goals Patient Stated Goal: to get better OT Goal Formulation: With patient Time For Goal Achievement: 07/07/24 Potential to Achieve Goals: Good ADL Goals Pt Will Perform Lower Body Bathing: with modified independence;sitting/lateral leans;sit to/from stand Pt Will Perform Lower Body Dressing: with modified independence;sit to/from stand;sitting/lateral leans Pt Will Transfer to Toilet: with modified  independence;ambulating;regular height toilet Pt Will Perform Toileting - Clothing Manipulation and hygiene: with modified independence;sitting/lateral leans;sit to/from stand Pt/caregiver will Perform Home Exercise Program: Increased ROM;Increased strength;Both right and left upper extremity;With written HEP provided;With Supervision;With theraband Additional ADL Goal #1: Patient will be able to complete functional task in standing for 3 minutes prior to needing seated rest break in order to increase activity tolerance. Additional ADL Goal #2: Patient will be able to complete upper level congitive task without assist in order to be able to live independently safely.  Plan      Co-evaluation                 AM-PAC OT 6 Clicks Daily Activity     Outcome Measure   Help from another person eating meals?: A Little Help from another person taking care of personal grooming?: A Little Help from another person toileting, which includes using toliet, bedpan, or urinal?: A Lot Help from another person bathing (including washing, rinsing, drying)?: A Lot Help from another person to put on and taking off regular upper body clothing?: A Little Help from another person to put on and taking off regular lower body clothing?: A Lot 6 Click Score: 15    End of Session Equipment Utilized During Treatment: Gait belt;Rolling walker (2 wheels)  OT Visit Diagnosis: Unsteadiness on feet (R26.81);Other abnormalities of gait and mobility (R26.89);Muscle weakness (generalized) (M62.81);Other symptoms and signs involving cognitive function   Activity Tolerance Patient tolerated treatment well   Patient Left in chair;with call bell/phone within reach;with chair alarm set   Nurse Communication Mobility status        Time: 9172-9095 OT Time Calculation (min): 37 min  Charges: OT General Charges $OT Visit: 1 Visit OT Evaluation $OT Eval Moderate Complexity: 1 Mod  Ronal Gift E. Maverick Dieudonne, OTR/L Acute  Rehabilitation Services [phone]   Ronal Gift Salt 06/23/2024, 3:49 PM

## 2024-06-23 NOTE — Progress Notes (Signed)
   Cardiologist:  [place]  Subjective:  Denies SSCP, palpitations or Dyspnea Has converted to sinus with A pacing on iv amiodarone   Objective:  Vitals:   06/23/24 0014 06/23/24 0435 06/23/24 0801 06/23/24 0907  BP: (!) 148/60 (!) 127/59 129/84 (!) 170/82  Pulse: 63 60 62 65  Resp: 20 18 17    Temp: 98.2 F (36.8 C) 98.8 F (37.1 C) 98.3 F (36.8 C)   TempSrc: Oral Oral Oral   SpO2: 99% 96% 92%   Weight:      Height:        Intake/Output from previous day:  Intake/Output Summary (Last 24 hours) at 06/23/2024 0941 Last data filed at 06/23/2024 0600 Gross per 24 hour  Intake 6 ml  Output 900 ml  Net -894 ml    Physical Exam:  PPM under left clavicle SEM Lungs clear No edema Abdomen benign  Lab Results: Basic Metabolic Panel: Recent Labs    06/20/24 1538 06/21/24 0822 06/22/24 0529  NA 135 139 134*  K 3.5 3.5 3.5  CL 103 109 108  CO2 20* 23 22  GLUCOSE 118* 92 88  BUN 36* 29* 27*  CREATININE 1.33* 1.30* 1.29*  CALCIUM  11.2* 10.6* 10.1  MG 2.0  --   --   PHOS  --  2.7  --    Liver Function Tests: Recent Labs    06/20/24 1538 06/21/24 0822  AST 43*  --   ALT 15  --   ALKPHOS 116  --   BILITOT 0.7  --   PROT 8.2*  --   ALBUMIN 2.9* 2.5*   No results for input(s): LIPASE, AMYLASE in the last 72 hours. CBC: Recent Labs    06/20/24 1538 06/21/24 0336 06/22/24 0529  WBC 4.0 3.4* 3.2*  NEUTROABS 3.1  --   --   HGB 11.7* 10.1* 9.9*  HCT 37.7 32.4* 30.5*  MCV 89.5 90.8 88.7  PLT 224 197 176    Thyroid  Function Tests: Recent Labs    06/22/24 0529  TSH 4.672*   Anemia Panel: No results for input(s): VITAMINB12, FOLATE, FERRITIN, TIBC, IRON , RETICCTPCT in the last 72 hours.  Imaging: No results found.  Cardiac Studies:  ECG: A pacing PAC   Telemetry:  A pacing   Echo: EF > 75% apical HOCM mild MR normal RV  Medications:    amiodarone   200 mg Oral BID   apixaban   2.5 mg Oral BID   feeding supplement  237  mL Oral BID BM   isosorbide  mononitrate  30 mg Oral Daily   metoprolol  tartrate  25 mg Oral BID   multivitamin with minerals  1 tablet Oral Daily   sodium chloride  flush  3 mL Intravenous Q12H        Assessment/Plan:   PAF:  on amiodarone  200 mg bid orally. 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 No angina continue beta blocker and lopressor   Cardiology will sign off Placement issues and home health Still to be determined    Barbara [name] 06/23/2024, 9:41 AM

## 2024-06-23 NOTE — TOC Progression Note (Addendum)
 Transition of Care [facility]) - Progression Note    Patient Details  Name: Barbara [name] MRN: [id number] Date of Birth: 1941/02/15  [facility] CM/SW Contact  Isaiah Public, LCSWA Phone Number: 06/23/2024, 1:02 PM  Clinical Narrative:     CSW received consult for possible SNF placement at time of discharge. CSW spoke with patient at bedside regarding PT recommendation of SNF placement at time of discharge.  Patient expressed understanding of PT recommendation and is agreeable to SNF placement at time of discharge.Patient reports PTA she comes from home alone. Patient request for CSW to call her son Barbara [name] to help assist with her discharge plan.  All questions answered. No further questions reported at this time. CSW spoke with patients son Barbara [name] regarding PT recommendations for SNF placement for patient at time of discharge. Patients son expressed understanding of PT recommendation and is agreeable to SNF placement for patient at time of discharge. Patients son gave CSW permission to fax out initial referral for SNF placement for patient.CSW discussed insurance authorization process and will  provide Medicare SNF ratings list with accepted SNF bed offers when available. Patients son confirmed that family can bring patients cpap from home to facility. No further questions reported at this time. CSW spoke with [name] with [facility] . [name] with [facility] started insurance authorization for SNF for patient. Insurance authorization for SNF currently pending. CSW to continue to follow and assist with patients discharge planning needs.   Update- CSW provided patients son Barbara [name] with SNF bed offers. Patients son informed CSW that he is going to review SNF bed offers and give CSW a call back with SNF choice for patient. CSW will continue to follow.   Expected Discharge Plan:  (TBD) Barriers to Discharge: Continued Medical Work up               Expected Discharge Plan and Services       Living  arrangements for the past 2 months: Single Family Home                                       Social Drivers of Health (SDOH) Interventions SDOH Screenings   Food Insecurity: No Food Insecurity (06/20/2024)  Housing: High Risk (06/20/2024)  Transportation Needs: Unmet Transportation Needs (06/20/2024)  Utilities: Not At Risk (06/20/2024)  Alcohol Screen: Low Risk  (08/05/2022)  Depression (PHQ2-9): Low Risk  (04/05/2024)  Financial Resource Strain: Low Risk  (08/05/2022)  Physical Activity: Insufficiently Active (07/02/2022)  Social Connections: Moderately Integrated (06/20/2024)  Stress: No Stress Concern Present (07/02/2022)  Tobacco Use: Medium Risk (06/20/2024)    Readmission Risk Interventions    05/24/2024    8:57 AM 03/11/2024    1:07 PM  Readmission Risk Prevention Plan  Transportation Screening Complete Complete  PCP or Specialist Appt within 5-7 Days  Complete  Home Care Screening  Complete  Medication Review (RN CM)  Complete  HRI or Home Care Consult Complete   Social Work Consult for Recovery Care Planning/Counseling Complete   Palliative Care Screening Not Applicable   Medication Review Oceanographer) Complete

## 2024-06-24 ENCOUNTER — Telehealth: Payer: Self-pay | Admitting: *Deleted

## 2024-06-24 DIAGNOSIS — I480 Paroxysmal atrial fibrillation: Secondary | ICD-10-CM | POA: Diagnosis not present

## 2024-06-24 MED ORDER — SUCRALFATE 1 GM/10ML PO SUSP
1.0000 g | Freq: Two times a day (BID) | ORAL | Status: DC
  Administered 2024-06-24 – 2024-06-28 (×9): 1 g via ORAL
  Filled 2024-06-24 (×9): qty 10

## 2024-06-24 MED ORDER — PANTOPRAZOLE SODIUM 40 MG PO TBEC
40.0000 mg | DELAYED_RELEASE_TABLET | Freq: Every day | ORAL | Status: DC
  Administered 2024-06-24 – 2024-06-28 (×5): 40 mg via ORAL
  Filled 2024-06-24 (×5): qty 1

## 2024-06-24 MED ORDER — VITAMIN B-12 100 MCG PO TABS
500.0000 ug | ORAL_TABLET | Freq: Every day | ORAL | Status: DC
  Administered 2024-06-24 – 2024-06-28 (×5): 500 ug via ORAL
  Filled 2024-06-24 (×5): qty 5

## 2024-06-24 MED ORDER — FAMOTIDINE 20 MG PO TABS
20.0000 mg | ORAL_TABLET | Freq: Two times a day (BID) | ORAL | Status: DC
  Administered 2024-06-24: 20 mg via ORAL
  Filled 2024-06-24: qty 1

## 2024-06-24 MED ORDER — NITROGLYCERIN 0.4 MG SL SUBL: 0.4000 mg | SUBLINGUAL_TABLET | SUBLINGUAL | Status: DC | PRN

## 2024-06-24 MED ORDER — EMPAGLIFLOZIN 10 MG PO TABS
10.0000 mg | ORAL_TABLET | Freq: Every day | ORAL | Status: DC
  Administered 2024-06-24 – 2024-06-28 (×5): 10 mg via ORAL
  Filled 2024-06-24 (×5): qty 1

## 2024-06-24 MED ORDER — MEGESTROL ACETATE 40 MG PO TABS
40.0000 mg | ORAL_TABLET | Freq: Every morning | ORAL | Status: DC
  Administered 2024-06-25 – 2024-06-28 (×4): 40 mg via ORAL
  Filled 2024-06-24 (×4): qty 1

## 2024-06-24 NOTE — Progress Notes (Signed)
   06/24/24 2229  BiPAP/CPAP/SIPAP  Reason BIPAP/CPAP not in use Other(comment) (cannot tolerate hospital mask)  BiPAP/CPAP /SiPAP Vitals  Pulse Rate (!) 59  SpO2 96 %  Bilateral Breath Sounds Clear;Diminished  MEWS Score/Color  MEWS Score 0  MEWS Score Color [place]y

## 2024-06-24 NOTE — Plan of Care (Signed)
   Problem: Health Behavior/Discharge Planning: Goal: Ability to manage health-related needs will improve Outcome: Progressing   Problem: Clinical Measurements: Goal: Ability to maintain clinical measurements within normal limits will improve Outcome: Progressing Goal: Diagnostic test results will improve Outcome: Progressing

## 2024-06-24 NOTE — TOC Progression Note (Addendum)
 Transition of Care [facility]) - Progression Note    Patient Details  Name: DUANA BENEDICT MRN: [id number] Date of Birth: [birth year]  Transition of Care [facility]) CM/SW Contact  Isaiah Public, LCSWA Phone Number: 06/24/2024, 11:36 AM  Clinical Narrative:     CSW spoke with patients son Sallee. Jerod request for CSW to check with [facility] to see if they have a private room and if patient will be in copay days.  CSW spoke with [place] with [facility] who informed CSW that patient will be in copay days and will need 30 days payment upfront. CSW spoke with [place] with [facility] who informed CSW that facility will need 2 weeks upfront.  Update- CSW spoke with patients son Jarod by phone. Jarod informed CSW that patient has decided that she would like to return home when ready for dc. Jarod would like to discuss HH options with case Film/video editor. Jarod agreeable for CSW to cancel insurance authorization for SNF Jarod request auth for PTAR. CSW informed case Film/video editor. TOC will continue to follow.  Update- CSW spoke with Jori with [employer] and request to cancel auth for SNF. Jori informed CSW that insurance auth for SNF already has been reviewed and SNF was denied. Jori informed CSW that insurance will send over denial letter to deliver to patients room. CSW requested to start auth for PTAR. Auth for PTAR currently pending. CSW informed patients son that SNF was denied and that CSW will deliver denial letter to patients room once received by patients insurance. All questions answered. No further questions reported at this time.  Expected Discharge Plan:  (TBD) Barriers to Discharge: Continued Medical Work up               Expected Discharge Plan and Services       Living arrangements for the past 2 months: Single Family Home                                       Social Drivers of Health (SDOH) Interventions SDOH Screenings   Food Insecurity: No  Food Insecurity (06/20/2024)  Housing: High Risk (06/20/2024)  Transportation Needs: Unmet Transportation Needs (06/20/2024)  Utilities: Not At Risk (06/20/2024)  Alcohol Screen: Low Risk  (08/05/2022)  Depression (PHQ2-9): Low Risk  (04/05/2024)  Financial Resource Strain: Low Risk  (08/05/2022)  Physical Activity: Insufficiently Active (07/02/2022)  Social Connections: Moderately Integrated (06/20/2024)  Stress: No Stress Concern Present (07/02/2022)  Tobacco Use: Medium Risk (06/20/2024)    Readmission Risk Interventions    05/24/2024    8:57 AM 03/11/2024    1:07 PM  Readmission Risk Prevention Plan  Transportation Screening Complete Complete  PCP or Specialist Appt within 5-7 Days  Complete  Home Care Screening  Complete  Medication Review (RN CM)  Complete  HRI or Home Care Consult Complete   Social Work Consult for Recovery Care Planning/Counseling Complete   Palliative Care Screening Not Applicable   Medication Review Oceanographer) Complete

## 2024-06-24 NOTE — TOC Progression Note (Addendum)
 Transition of Care [facility]) - Progression Note    Patient Details  Name: Barbara [name] MRN: [id number] Date of Birth: [birth year]  Transition [facility] Care [facility]) CM/SW Contact  Waddell Barnie Rama, RN Phone Number: 06/24/2024, 3:50 PM  Clinical Narrative:    NCM was notified by CSW that son wants to speak to this NCM about HH.  NCM spoke to son, he states she was at [facility] and they set her up with [facility] when she left but he can not remember the name of the agency.  NCM contacted the PCP office and they said that she is set up with [facility]. NCM contacted Amy with [facility] , she states she is set up with HHRN, HHPT, HHOT, HHST ( she is active with them).  NCM informed MD will need HH order for these services.  The CSW Isaiah is working on getting authorization for ambulance transport home at [facility].  NCM spoke with patient's insurance [employer], they states she gets 60 hrs of custodial care at home but this will need to be prior authorized thru her PCP , the PCP office will have to call [phone] opt 3,  within 30 days of discharge from the hospital.    Per the son he will have to get someone to be with patient at home , not sure if he has this set up just yet so this will need to be confirmed before she is discharged to be a safe dc.    Expected Discharge Plan:  (TBD) Barriers to Discharge: Continued Medical Work up               Expected Discharge Plan and Services       Living arrangements for the past 2 months: Single Family Home                                       Social Drivers of Health (SDOH) Interventions SDOH Screenings   Food Insecurity: No Food Insecurity (06/20/2024)  Housing: High Risk (06/20/2024)  Transportation Needs: Unmet Transportation Needs (06/20/2024)  Utilities: Not At Risk (06/20/2024)  Alcohol Screen: Low Risk  (08/05/2022)  Depression (PHQ2-9): Low Risk  (04/05/2024)  Financial Resource Strain: Low Risk  (08/05/2022)  Physical Activity:  Insufficiently Active (07/02/2022)  Social Connections: Moderately Integrated (06/20/2024)  Stress: No Stress Concern Present (07/02/2022)  Tobacco Use: Medium Risk (06/20/2024)    Readmission Risk Interventions    05/24/2024    8:57 AM 03/11/2024    1:07 PM  Readmission Risk Prevention Plan  Transportation Screening Complete Complete  PCP or Specialist Appt within 5-7 Days  Complete  Home Care Screening  Complete  Medication Review (RN CM)  Complete  HRI or Home Care Consult Complete   Social Work Consult for Recovery Care Planning/Counseling Complete   Palliative Care Screening Not Applicable   Medication Review Oceanographer) Complete

## 2024-06-24 NOTE — Progress Notes (Signed)
 Patient reporting midsternal chest pain. She states it gets worse with deep breaths and tylenol  does not help. Notified MD

## 2024-06-24 NOTE — Telephone Encounter (Signed)
 Copied from CRM [id number]. Topic: Appointments - Appointment Scheduling >> Jun 24, 2024  3:16 PM Rosina BIRCH wrote: Patient/patient representative is calling to schedule an appointment. Refer to attachments for appointment information. Debra a case Production designer, theatre/television/film from [place] want to speak to the nurse regarding home health order and the name of the place. Adrien states the patient has health team advantage and she gets sixty hours of custodial care. The provider need to contact utilization management to get prior approval at [phone] option 3. Adrien states it has be done within 30 days of discharge from the hospital

## 2024-06-24 NOTE — Telephone Encounter (Signed)
 Copied from CRM #[id number]. Topic: General - Other >> Jun 18, 2024  4:16 PM Chiquita SQUIBB wrote: Reason for CRM: Bari from [facility] is calling in stating they received a referral for the patient to see them from her rehab services. They spoke to the patient today and the patient was very confused as she thought Dr. Johnny was finding home care for her. [facility] home [facility] is confused if they should be scheduling the patient or not, they also wanted to advise the doctor the soonest they could go to the patient is on Tuesday. Please advise [name] at [phone] for clarification. >> Jun 24, 2024  2:51 PM Drema MATSU wrote: Patient is calling to follow up on 8 hour home health overnight services. She is requesting to call son to follow up. Jerod [phone]

## 2024-06-24 NOTE — Progress Notes (Signed)
 Mobility Specialist Progress Note:   06/24/24 1130  Mobility  Activity Pivoted/transferred from chair to bed  Level of Assistance Maximum assist, patient does 25-49%  Assistive Device Front wheel walker  Distance Ambulated (ft) 3 ft  Activity Response Tolerated fair  Mobility Referral Yes  Mobility visit 1 Mobility  Mobility Specialist Start Time (ACUTE ONLY) 1130  Mobility Specialist Stop Time (ACUTE ONLY) 1145  Mobility Specialist Time Calculation (min) (ACUTE ONLY) 15 min   Pt requested to transfer back to bed. Required heavy maxA to stand from recliner. MinA to pivot to bed. Pt c/o generalized weakness. Back in bed with all needs met.  Therisa Rana Mobility Specialist Please contact via SecureChat or  Rehab office at [phone]

## 2024-06-24 NOTE — Progress Notes (Signed)
 Mobility Specialist Progress Note:   06/24/24 1030  Mobility  Activity Pivoted/transferred from bed to chair  Level of Assistance Minimal assist, patient does 75% or more  Assistive Device Front wheel walker  Distance Ambulated (ft) 3 ft  Activity Response Tolerated well  Mobility Referral Yes  Mobility visit 1 Mobility  Mobility Specialist Start Time (ACUTE ONLY) 1030  Mobility Specialist Stop Time (ACUTE ONLY) 1045  Mobility Specialist Time Calculation (min) (ACUTE ONLY) 15 min   Pt agreeable to mobility session. Required only minA to stand with RW. Had x2 posterior LOB while pivoting to chair. Pt c/o weakness throughout transfer. Left in chair with all needs met, alarm on.  Therisa Rana Mobility Specialist Please contact via SecureChat or  Rehab office at [phone]

## 2024-06-24 NOTE — Progress Notes (Signed)
 PROGRESS NOTE  Barbara [name] FMW:[id number] DOB: [birth year] DOA: 06/20/2024 PCP: Johnny Garnette LABOR, MD  HPI/Recap of past 24 hours: Barbara [name] is a 83 y.o. female with medical history significant for CAD (s/p PCI LAD 2000), PAF on Eliquis , chronic HFpEF, apical HCM, SSS s/p PPM, CKD stage IIIb, chronic hypercalcemia, HTN, HLD, chronic anemia, OSA who presented to the ED for evaluation of progressive SOB, palpitations, chest pain for the past couple of days, and unintentional weight loss for months. In the ED, VS with HR in the 160s, otherwise fairly stable. Labs showed troponin 408 > 398, BNP 980.3, calcium  11.2. SARS-CoV-2, influenza, RSV PCR negative.  UA negative for any infection. Portable chest x-ray negative for focal consolidation, edema, effusion. CTA chest negative for PE.  Cardiology consulted.  [facility] hospitalist admitted patient for further management. She was transferred to [facility] for cardioversion however she converted to sinus rhythm on amiodarone .   Subjective: Patient seen and examined.  No overnight events.  Medically stable.  Waiting for SNF bed.   Assessment/Plan: Principal Problem:   Paroxysmal atrial fibrillation with rapid ventricular response (HCC) Active Problems:   CAD (coronary artery disease/elevated troponin   Essential hypertension   Chronic kidney disease, stage 3b (HCC)   Hyperlipidemia   OSA (obstructive sleep apnea)   Chronic heart failure with preserved ejection fraction (HFpEF) (HCC)   Hypercalcemia   Paroxysmal atrial fibrillation with RVR SSS s/p PPM Patient presenting with A-fib RVR, rate 160s TSH 4.6.  T4.1.24.  Not likely contributing. Seen by cardiology.  Treated with amiodarone  drip.  Now on oral amiodarone .  Initially planned for cardioversion, however she spontaneously converted to sinus rhythm. Oral amiodarone , metoprolol , therapeutic on Eliquis . Mobilize and monitor.   Chronic HFpEF BNP 980.3 TTE on 05/18/2024  showed systolic LV mid cavity obliteration with apical entrapment, no LV outflow obstruction.  EF >75% with hyperdynamic function.  No RWMA.  Severe asymmetric apical LVH, G2DD. Continue metoprolol  Cardiology following.  CAD s/p PCI History of PCI to LAD in 2000 Troponin elevated 408 > 398 Currently chest pain-free Likely demand ischemia in setting of A-fib RVR LHC 05/06/2022 showed mild-moderate nonobstructive CAD and 30% in-stent restenosis of LAD stent Continue metoprolol , Imdur  Continue Eliquis    Chronic hypercalcemia Patient with persistent hypercalcemia of unknown etiology.  Has had extensive prior workup.  Imaging including chest, abdomen, pelvis have been unrevealing.  She had negative workup for monoclonal gammopathy.  X-ray skeletal survey did not show evidence of bone lytic lesions.  PTH related peptide was normal Continue workup as outpatient   CKD stage IIIb Renal function stable Intermittent monitoring.   Hypertension Continue metoprolol  and Imdur .   Hyperlipidemia Does not appear to be on statin as an outpatient.   OSA Continue CPAP nightly  Hypoalbuminemia Noted significant weight loss, reports poor appetite Dietitian following.  Added nutrition.      Estimated body mass index is 19.74 kg/m as calculated from the following:   Height as of this encounter: 5' 8 (1.727 m).   Weight as of this encounter: 58.9 kg.     Code Status: Full  Family Communication: None at bedside.  Son Barbara [name] intermittently updated.  Disposition Plan: Status is: Inpatient.  Medically stabilized.  Needs skilled nursing facility placement.      Consultants: Cardiology  Procedures: None  Antimicrobials: None  DVT prophylaxis: Eliquis    Objective: Vitals:   06/24/24 0420 06/24/24 0819 06/24/24 0917 06/24/24 1203  BP: (!) 123/56 (!) 144/73 (!) 149/70 ROLLEN)  130/59  Pulse: 60 60 60 60  Resp: 16 20  (!) 21  Temp: 97.7 F (36.5 C) 98.3 F (36.8 C)  98 F (36.7 C)   TempSrc: Oral Oral  Oral  SpO2: 99% 100%  95%  Weight:      Height:        Intake/Output Summary (Last 24 hours) at 06/24/2024 1228 Last data filed at 06/24/2024 1142 Gross per 24 hour  Intake 911.91 ml  Output 1000 ml  Net -88.09 ml   Filed Weights   06/20/24 1535 06/20/24 2214 06/21/24 2039  Weight: 51.3 kg 58 kg 58.9 kg    General: Alert awake and oriented.  Pleasant interactive. Cardiovascular: S1-S2 normal.  Regular rate rhythm. Respiratory: Bilateral clear.  No added sounds. Gastrointestinal: Soft.  Nontender.  Bowel sound present. Ext: No swelling or edema.  No cyanosis. Neuro: Alert awake.  Gross generalized weakness. Musculoskeletal: No deformities.     Data Reviewed: CBC: Recent Labs  Lab 06/20/24 1538 06/21/24 0336 06/22/24 0529  WBC 4.0 3.4* 3.2*  NEUTROABS 3.1  --   --   HGB 11.7* 10.1* 9.9*  HCT 37.7 32.4* 30.5*  MCV 89.5 90.8 88.7  PLT 224 197 176   Basic Metabolic Panel: Recent Labs  Lab 06/18/24 1545 06/20/24 1538 06/21/24 0822 06/22/24 0529  NA 137 135 139 134*  K 4.2 3.5 3.5 3.5  CL 107 103 109 108  CO2 21 20* 23 22  GLUCOSE 103* 118* 92 88  BUN 30* 36* 29* 27*  CREATININE 1.30* 1.33* 1.30* 1.29*  CALCIUM  11.6* 11.2* 10.6* 10.1  MG 2.2 2.0  --   --   PHOS 2.6  --  2.7  --    GFR: Estimated Creatinine Clearance: 29.6 mL/min (A) (by C-G formula based on SCr of 1.29 mg/dL (H)). Liver Function Tests: Recent Labs  Lab 06/20/24 1538 06/21/24 0822  AST 43*  --   ALT 15  --   ALKPHOS 116  --   BILITOT 0.7  --   PROT 8.2*  --   ALBUMIN 2.9* 2.5*   No results for input(s): LIPASE, AMYLASE in the last 168 hours. No results for input(s): AMMONIA in the last 168 hours. Coagulation Profile: No results for input(s): INR, PROTIME in the last 168 hours. Cardiac Enzymes: No results for input(s): CKTOTAL, CKMB, CKMBINDEX, TROPONINI in the last 168 hours. BNP (last 3 results) No results for input(s): PROBNP in the  last 8760 hours. HbA1C: No results for input(s): HGBA1C in the last 72 hours. CBG: No results for input(s): GLUCAP in the last 168 hours. Lipid Profile: No results for input(s): CHOL, HDL, LDLCALC, TRIG, CHOLHDL, LDLDIRECT in the last 72 hours. Thyroid  Function Tests: Recent Labs    06/22/24 0529  TSH 4.672*   Anemia Panel: No results for input(s): VITAMINB12, FOLATE, FERRITIN, TIBC, IRON , RETICCTPCT in the last 72 hours. Urine analysis:    Component Value Date/Time   COLORURINE YELLOW 06/20/2024 2116   APPEARANCEUR HAZY (A) 06/20/2024 2116   LABSPEC 1.020 06/20/2024 2116   PHURINE 5.0 06/20/2024 2116   GLUCOSEU >=500 (A) 06/20/2024 2116   GLUCOSEU NEGATIVE 09/13/2021 1203   HGBUR NEGATIVE 06/20/2024 2116   HGBUR negative 10/17/2010 0859   BILIRUBINUR NEGATIVE 06/20/2024 2116   BILIRUBINUR neg 08/01/2021 1037   KETONESUR NEGATIVE 06/20/2024 2116   PROTEINUR NEGATIVE 06/20/2024 2116   UROBILINOGEN 0.2 09/13/2021 1203   NITRITE NEGATIVE 06/20/2024 2116   LEUKOCYTESUR NEGATIVE 06/20/2024 2116   Sepsis Labs: @LABRCNTIP (procalcitonin:4,lacticidven:4)  )  Recent Results (from the past 240 hours)  Resp panel by RT-PCR (RSV, Flu A&B, Covid) Anterior Nasal Swab     Status: None   Collection Time: 06/20/24  4:12 PM   Specimen: Anterior Nasal Swab  Result Value Ref Range Status   SARS Coronavirus 2 by RT PCR NEGATIVE NEGATIVE Final    Comment: (NOTE) SARS-CoV-2 target nucleic acids are NOT DETECTED.  The SARS-CoV-2 RNA is generally detectable in upper respiratory specimens during the acute phase of infection. The lowest concentration of SARS-CoV-2 viral copies this assay can detect is 138 copies/mL. A negative result does not preclude SARS-Cov-2 infection and should not be used as the sole basis for treatment or other patient management decisions. A negative result may occur with  improper specimen collection/handling, submission of specimen  other than nasopharyngeal swab, presence of viral mutation(s) within the areas targeted by this assay, and inadequate number of viral copies(<138 copies/mL). A negative result must be combined with clinical observations, patient history, and epidemiological information. The expected result is Negative.  Fact Sheet for Patients:  BloggerCourse.com  Fact Sheet for Healthcare Providers:  SeriousBroker.it  This test is no t yet approved or cleared by the [place]  FDA and  has been authorized for detection and/or diagnosis of SARS-CoV-2 by FDA under an Emergency Use Authorization (EUA). This EUA will remain  in effect (meaning this test can be used) for the duration of the COVID-19 declaration under Section 564(b)(1) of the Act, 21 U.S.C.section 360bbb-3(b)(1), unless the authorization is terminated  or revoked sooner.       Influenza A by PCR NEGATIVE NEGATIVE Final   Influenza B by PCR NEGATIVE NEGATIVE Final    Comment: (NOTE) The Xpert Xpress SARS-CoV-2/FLU/RSV plus assay is intended as an aid in the diagnosis of influenza from Nasopharyngeal swab specimens and should not be used as a sole basis for treatment. Nasal washings and aspirates are unacceptable for Xpert Xpress SARS-CoV-2/FLU/RSV testing.  Fact Sheet for Patients: BloggerCourse.com  Fact Sheet for Healthcare Providers: SeriousBroker.it  This test is not yet approved or cleared by the [place]  FDA and has been authorized for detection and/or diagnosis of SARS-CoV-2 by FDA under an Emergency Use Authorization (EUA). This EUA will remain in effect (meaning this test can be used) for the duration of the COVID-19 declaration under Section 564(b)(1) of the Act, 21 U.S.C. section 360bbb-3(b)(1), unless the authorization is terminated or revoked.     Resp Syncytial Virus by PCR NEGATIVE NEGATIVE Final     Comment: (NOTE) Fact Sheet for Patients: BloggerCourse.com  Fact Sheet for Healthcare Providers: SeriousBroker.it  This test is not yet approved or cleared by the [place]  FDA and has been authorized for detection and/or diagnosis of SARS-CoV-2 by FDA under an Emergency Use Authorization (EUA). This EUA will remain in effect (meaning this test can be used) for the duration of the COVID-19 declaration under Section 564(b)(1) of the Act, 21 U.S.C. section 360bbb-3(b)(1), unless the authorization is terminated or revoked.  Performed at [facility], [address] [place], [place] [address]       Studies: No results found.   Scheduled Meds:  amiodarone   200 mg Oral BID   apixaban   2.5 mg Oral BID   feeding supplement  237 mL Oral BID BM   isosorbide  mononitrate  30 mg Oral Daily   metoprolol  tartrate  25 mg Oral BID   multivitamin with minerals  1 tablet Oral Daily   sodium chloride  flush  3 mL Intravenous Q12H    Continuous Infusions:     LOS: 3 days     Renato Applebaum, MD

## 2024-06-25 ENCOUNTER — Telehealth: Payer: Self-pay

## 2024-06-25 DIAGNOSIS — I480 Paroxysmal atrial fibrillation: Secondary | ICD-10-CM | POA: Diagnosis not present

## 2024-06-25 MED ORDER — BISACODYL 10 MG RE SUPP
10.0000 mg | Freq: Once | RECTAL | Status: AC
  Administered 2024-06-25: 10 mg via RECTAL
  Filled 2024-06-25: qty 1

## 2024-06-25 MED ORDER — POLYETHYLENE GLYCOL 3350 17 G PO PACK
17.0000 g | PACK | Freq: Every day | ORAL | Status: DC
  Administered 2024-06-25 – 2024-06-28 (×4): 17 g via ORAL
  Filled 2024-06-25 (×4): qty 1

## 2024-06-25 NOTE — Telephone Encounter (Signed)
 Spoke with [place] with [facility] regarding this encounter, Bari states that pt is back to the hospital and may be moving to a skilled facility after the hospital discharge, states that they don't need anything else for now.

## 2024-06-25 NOTE — Telephone Encounter (Signed)
 Noted

## 2024-06-25 NOTE — TOC Progression Note (Signed)
 Transition of Care [facility]) - Progression Note    Patient Details  Name: Barbara [name] MRN: [id number] Date of Birth: [birth year]  [facility]) CM/SW Contact  Isaiah Public, LCSWA Phone Number: 06/25/2024, 11:29 AM  Clinical Narrative:     CSW delivered insurance denial letter for SNF from [employer] for patient to patients room. CSW received message from [place] with [employer] that PTAR was approved for patient # [phone]. CSW informed CM who plans to follow up with patients son on how patient will transport home. TOC will continue to follow.   Expected Discharge Plan:  (TBD) Barriers to Discharge: Continued Medical Work up               Expected Discharge Plan and Services       Living arrangements for the past 2 months: Single Family Home                                       Social Drivers of Health (SDOH) Interventions SDOH Screenings   Food Insecurity: No Food Insecurity (06/20/2024)  Housing: High Risk (06/20/2024)  Transportation Needs: Unmet Transportation Needs (06/20/2024)  Utilities: Not At Risk (06/20/2024)  Alcohol Screen: Low Risk  (08/05/2022)  Depression (PHQ2-9): Low Risk  (04/05/2024)  Financial Resource Strain: Low Risk  (08/05/2022)  Physical Activity: Insufficiently Active (07/02/2022)  Social Connections: Moderately Integrated (06/20/2024)  Stress: No Stress Concern Present (07/02/2022)  Tobacco Use: Medium Risk (06/20/2024)    Readmission Risk Interventions    05/24/2024    8:57 AM 03/11/2024    1:07 PM  Readmission Risk Prevention Plan  Transportation Screening Complete Complete  PCP or Specialist Appt within 5-7 Days  Complete  Home Care Screening  Complete  Medication Review (RN CM)  Complete  HRI or Home Care Consult Complete   Social Work Consult for Recovery Care Planning/Counseling Complete   Palliative Care Screening Not Applicable   Medication Review Oceanographer) Complete

## 2024-06-25 NOTE — Progress Notes (Signed)
 Occupational Therapy Treatment Patient Details Name: Barbara [name] MRN: [id number] DOB: 1940/12/12 Today's Date: 06/25/2024   History of present illness 83 yo female adm 06/20/24 with SOB, chest pain, AFib with RVR. PMH: CAD, HFpEF, A-fib on Eliquis , essential HTN, HLD, anxiety, CKD, SSS s/p PPM, anemia, Rt TSA, vertigo, Lt femur fx   OT comments  Pt. Seen for skilled OT treatment session.  Pt. Completed bed mobility oob with no use of rails to mimic home env. Pt. Required increased time for this task.  For back to bed pt. Required MIN A for bringing BLEs into bed.  In room ambulation with CGA but required MIN/MOD A for sit/stand from various surfaces.  Able to ambulate to b.room for toileting and standing grooming task at sink.  Reports she has an aide that assists her until 8pm and uses a 3n1 in her room after aide leaves for the night.   O2 stable throughout session on RA.  Agree with recommendations of  <3hrs/day of  cont. Therapies post hospital stay.        If plan is discharge home, recommend the following:  A little help with walking and/or transfers;A lot of help with bathing/dressing/bathroom;Assistance with cooking/housework;Direct supervision/assist for medications management;Direct supervision/assist for financial management;Assist for transportation;Help with stairs or ramp for entrance;Supervision due to cognitive status   Equipment Recommendations       Recommendations for Other Services      Precautions / Restrictions Precautions Precautions: Fall Recall of Precautions/Restrictions: Impaired       Mobility Bed Mobility Overal bed mobility: Needs Assistance Bed Mobility: Supine to Sit, Sit to Supine     Supine to sit: Supervision Sit to supine: Min assist   General bed mobility comments: HOB 15 degrees, no use of rail, pt. with increased time to come into sitting then remove blanket, pulling on RW to scoot to eob, back to bed requried min a for bles back  into bed    Transfers Overall transfer level: Needs assistance Equipment used: Rolling walker (2 wheels) Transfers: Sit to/from Stand, Bed to chair/wheelchair/BSC Sit to Stand: Min assist, Mod assist     Step pivot transfers: Min assist     General transfer comment: CGA with increased time to rise from bed with cues for hand placement. MOD to stand from 3n1 over commode     Balance                                           ADL either performed or assessed with clinical judgement   ADL Overall ADL's : Needs assistance/impaired     Grooming: Supervision/safety;Standing                   Toilet Transfer: Contact guard assist;Cueing for sequencing;Ambulation;Rolling walker (2 wheels);BSC/3in1;Regular Teacher, adult education Details (indicate cue type and reason): 3n1 over commode, cues for RW managment   Toileting - Clothing Manipulation Details (indicate cue type and reason): RN present to assist with impacted stool removal     Functional mobility during ADLs: Minimal assistance;Cueing for sequencing;Cueing for safety;Rolling walker (2 wheels) General ADL Comments: pt. reports she will have a caregiver until 8pm every night and uses a 3n1 in her bedroom when caregiver not present    Extremity/Trunk Assessment              Vision  Perception     Praxis     Communication Communication Communication: No apparent difficulties   Cognition Arousal: Alert Behavior During Therapy: WFL for tasks assessed/performed Cognition: Cognition impaired     Awareness: Intellectual awareness impaired, Online awareness impaired Memory impairment (select all impairments): Short-term memory, Working memory Attention impairment (select first level of impairment): Sustained attention Executive functioning impairment (select all impairments): Initiation, Organization, Sequencing, Problem solving                   Following commands:  Impaired Following commands impaired: Follows one step commands with increased time      Cueing   Cueing Techniques: Verbal cues  Exercises      Shoulder Instructions       General Comments      Pertinent Vitals/ Pain       Pain Assessment Pain Assessment: Faces Faces Pain Scale: Hurts little more Pain Location: rectum Pain Descriptors / Indicators: Discomfort, Aching Pain Intervention(s): Repositioned, Other (comment) (rn aware as she had to perform stool removal for pt. during session)  Home Living                                          Prior Functioning/Environment              Frequency  Min 2X/week        Progress Toward Goals  OT Goals(current goals can now be found in the care plan section)  Progress towards OT goals: Progressing toward goals     Plan      Co-evaluation                 AM-PAC OT 6 Clicks Daily Activity     Outcome Measure   Help from another person eating meals?: A Little Help from another person taking care of personal grooming?: A Little Help from another person toileting, which includes using toliet, bedpan, or urinal?: A Lot Help from another person bathing (including washing, rinsing, drying)?: A Lot Help from another person to put on and taking off regular upper body clothing?: A Little Help from another person to put on and taking off regular lower body clothing?: A Lot 6 Click Score: 15    End of Session Equipment Utilized During Treatment: Rolling walker (2 wheels)  OT Visit Diagnosis: Unsteadiness on feet (R26.81);Other abnormalities of gait and mobility (R26.89);Muscle weakness (generalized) (M62.81);Other symptoms and signs involving cognitive function   Activity Tolerance Patient tolerated treatment well   Patient Left in bed;with call bell/phone within reach;with bed alarm set   Nurse Communication Other (comment) (rn assisted pt. in b.room during session, made aware of pt. rectum  complaints after rn had assited with removal of impacted stool)        Time: 8764-8677 OT Time Calculation (min): 47 min  Charges: OT General Charges $OT Visit: 1 Visit OT Treatments $Self Care/Home Management : 38-52 mins  Randall, COTA/L Acute Rehabilitation [phone]   CHRISTELLA Nest Lorraine-COTA/L  06/25/2024, 2:02 PM

## 2024-06-25 NOTE — Telephone Encounter (Signed)
 Please follow-up on whether Eliquis  hold is needed.

## 2024-06-25 NOTE — Telephone Encounter (Signed)
 The letter is ready

## 2024-06-25 NOTE — Telephone Encounter (Signed)
 Spoke with pt son who was not happy with the referral requested for Custodial care, stated that pt is ready for Discharge but the Hospital is waiting on pt referral to get approved. Spoke with Hailey with [employer] pt insurance who provided me with the [place] agency that they use for custodial care [facility] care). Spoke with Wolm Bellingham with [facility] who asked questions regarding pt and also informed on what is needed for the custodial referral. Stated that they need a letter from Dr Johnny stating that he is referring pt to [facility] care for 20 hrs Custodial care. Stated to fax the letter and [facility] care will contact pt son to arrange on the hour of care. Spoke with Kristi from [facility] regarding this update.

## 2024-06-25 NOTE — Progress Notes (Signed)
 Physical Therapy Treatment Patient Details Name: Barbara [name] MRN: [id number] DOB: 04-20-1941 Today's Date: 06/25/2024   History of Present Illness 83 yo female adm 06/20/24 with SOB, chest pain, AFib with RVR. PMH: CAD, HFpEF, A-fib on Eliquis , essential HTN, HLD, anxiety, CKD, SSS s/p PPM, anemia, Rt TSA, vertigo, Lt femur fx    PT Comments  Pt pleasant with limited assist at home and continues to struggle with all basic transfers and gait requiring min assist. Pt fatigues quickly with repeated transfers with maintained HR 62-72, SPO2 95% on RA. 83 educated for continued OOB activity, ambulation and mobility. Patient will benefit from continued inpatient follow up therapy, <3 hours/day     If plan is discharge home, recommend the following: A little help with walking and/or transfers;A little help with bathing/dressing/bathroom;Supervision due to cognitive status;Assistance with cooking/housework;Direct supervision/assist for medications management;Direct supervision/assist for financial management;Assist for transportation;Help with stairs or ramp for entrance   Can travel by private vehicle     Yes  Equipment Recommendations  None recommended by PT    Recommendations for Other Services       Precautions / Restrictions Precautions Precautions: Fall Recall of Precautions/Restrictions: Impaired     Mobility  Bed Mobility Overal bed mobility: Needs Assistance Bed Mobility: Supine to Sit     Supine to sit: Min assist, Used rails, HOB elevated     General bed mobility comments: HOB 15 degrees, heavy use of rail, min assist to fully pivot and rise from bed    Transfers Overall transfer level: Needs assistance   Transfers: Sit to/from Stand Sit to Stand: Min assist           General transfer comment: CGA with increased time to rise from bed with cues for hand placement. Pt performed 5 repeated sit to stands at chair with CGA x 2 and min assist x 3 over 4 min  time with mod cues for hand placement, safety and sequence    Ambulation/Gait Ambulation/Gait assistance: Min assist Gait Distance (Feet): 100 Feet Assistive device: Rolling walker (2 wheels) Gait Pattern/deviations: Step-through pattern, Decreased stride length, Shuffle, Leaning posteriorly   Gait velocity interpretation: <1.8 ft/sec, indicate of risk for recurrent falls   General Gait Details: 2 posterior right LOB with min asist to correct, CGA for remainder of ambulation with cues for navigating around obstacles.   Stairs             Wheelchair Mobility     Tilt Bed    Modified Rankin (Stroke Patients Only)       Balance Overall balance assessment: Needs assistance Sitting-balance support: No upper extremity supported, Feet supported Sitting balance-Leahy Scale: Fair Sitting balance - Comments: EOB without support   Standing balance support: Bilateral upper extremity supported, During functional activity, Reliant on assistive device for balance Standing balance-Leahy Scale: Poor Standing balance comment: RW in standing, min assist with gait                            Communication Communication Communication: No apparent difficulties  Cognition Arousal: Alert Behavior During Therapy: WFL for tasks assessed/performed   PT - Cognitive impairments: Safety/Judgement, Problem solving                       PT - Cognition Comments: decreased awareness of deficits with slow processing Following commands: Impaired Following commands impaired: Follows one step commands with increased time    Cueing  Cueing Techniques: Verbal cues  Exercises      General Comments        Pertinent Vitals/Pain Pain Assessment Faces Pain Scale: Hurts little more Pain Location: chest with SOB Pain Descriptors / Indicators: Discomfort, Aching Pain Intervention(s): Limited activity within patient's tolerance, Monitored during session, Repositioned    Home  Living                          Prior Function            PT Goals (current goals can now be found in the care plan section) Progress towards PT goals: Progressing toward goals    Frequency    Min 2X/week      PT Plan      Co-evaluation              AM-PAC PT 6 Clicks Mobility   Outcome Measure  Help needed turning from your back to your side while in a flat bed without using bedrails?: A Little Help needed moving from lying on your back to sitting on the side of a flat bed without using bedrails?: A Little Help needed moving to and from a bed to a chair (including a wheelchair)?: A Little Help needed standing up from a chair using your arms (e.g., wheelchair or bedside chair)?: A Little Help needed to walk in hospital room?: A Little Help needed climbing 3-5 steps with a railing? : Total 6 Click Score: 16    End of Session Equipment Utilized During Treatment: Gait belt Activity Tolerance: Patient tolerated treatment well Patient left: in chair;with call bell/phone within reach;with chair alarm set;with nursing/sitter in room Nurse Communication: Mobility status PT Visit Diagnosis: Unsteadiness on feet (R26.81);Muscle weakness (generalized) (M62.81);Other abnormalities of gait and mobility (R26.89);Difficulty in walking, not elsewhere classified (R26.2)     Time: 9187-9158 PT Time Calculation (min) (ACUTE ONLY): 29 min  Charges:    $Gait Training: 8-22 mins $Therapeutic Activity: 8-22 mins PT General Charges $$ ACUTE PT VISIT: 1 Visit                     Lenoard SQUIBB, PT Acute Rehabilitation Services Office: [phone]    [facility] KATHEE Docker 06/25/2024, 9:29 AM

## 2024-06-25 NOTE — Plan of Care (Signed)

## 2024-06-25 NOTE — TOC CM/SW Note (Signed)
 Received call from [place] at Dr. Mira office- they have spoken with [facility] regarding pt's need for custodial care and are working on paperwork needed. Per [facility] they contract custodial care with [facility]- PCP office has also spoken with [facility] and once referral has been approved by [facility] they will work to get care scheduled. [facility] will reach out to son regarding scheduling.  At this time care w/ [facility] is pending final approval with pt's insurance [facility], and staffing availability by [facility].  PCP office following.

## 2024-06-25 NOTE — Telephone Encounter (Signed)
 Attempted to contact surgical office tor receive clarification on Eliquis  hold. LVM[facility]

## 2024-06-25 NOTE — Telephone Encounter (Signed)
 Left detailed message for Ted advised to call the office with further questions

## 2024-06-25 NOTE — Telephone Encounter (Signed)
 Pt documents for pt Custodial care referral to [facility] was faxed successfully as requested by Bruce with [facility]

## 2024-06-25 NOTE — Progress Notes (Signed)
 PROGRESS NOTE  Barbara [name] FMW:[id number] DOB: 1941-09-24 DOA: 06/20/2024 PCP: Johnny Garnette LABOR, MD  HPI/Recap of past 24 hours: Barbara [name] is a 83 y.o. female with medical history significant for CAD (s/p PCI LAD 2000), PAF on Eliquis , chronic HFpEF, apical HCM, SSS s/p PPM, CKD stage IIIb, chronic hypercalcemia, HTN, HLD, chronic anemia, OSA who presented to the ED for evaluation of progressive SOB, palpitations, chest pain for the past couple of days, and unintentional weight loss for months. In the ED, VS with HR in the 160s, otherwise fairly stable. Labs showed troponin 408 > 398, BNP 980.3, calcium  11.2. SARS-CoV-2, influenza, RSV PCR negative.  UA negative for any infection. Portable chest x-ray negative for focal consolidation, edema, effusion. CTA chest negative for PE.  Cardiology consulted.  [facility] hospitalist admitted patient for further management. She was transferred to [facility] for cardioversion however she converted to sinus rhythm on amiodarone .   Subjective: Patient seen and examined.  Today she walked in the hallway with the physical therapist.  She was tired. Midsternal chest pain improved with Protonix  and sucralfate . She knows she has to go home, not sure how much help she will be able to gather at home.   Assessment/Plan: Principal Problem:   Paroxysmal atrial fibrillation with rapid ventricular response (HCC) Active Problems:   CAD (coronary artery disease/elevated troponin   Essential hypertension   Chronic kidney disease, stage 3b (HCC)   Hyperlipidemia   OSA (obstructive sleep apnea)   Chronic heart failure with preserved ejection fraction (HFpEF) (HCC)   Hypercalcemia   Paroxysmal atrial fibrillation with RVR SSS s/p PPM Patient presenting with A-fib RVR, rate 160s TSH 4.6.  T4.1.24.  Not likely contributing. Seen by cardiology.  Treated with amiodarone  drip.  Now on oral amiodarone .  Initially planned for cardioversion, however she  spontaneously converted to sinus rhythm. Oral amiodarone , metoprolol , therapeutic on Eliquis . Mobilize and monitor.  Remains on sinus rhythm.  Chest pain:  Midsternal chest pain not likely cardiac in origin.  She is therapeutic on Eliquis . Does have significant history of reflux.  Added back on Protonix  and sucralfate  with some clinical improvement.   Chronic HFpEF BNP 980.3 TTE on 05/18/2024 showed systolic LV mid cavity obliteration with apical entrapment, no LV outflow obstruction.  EF >75% with hyperdynamic function.  No RWMA.  Severe asymmetric apical LVH, G2DD. Continue metoprolol  Cardiology following.  CAD s/p PCI History of PCI to LAD in 2000 Troponin elevated 408 > 398 Currently chest pain-free Likely demand ischemia in setting of A-fib RVR LHC 05/06/2022 showed mild-moderate nonobstructive CAD and 30% in-stent restenosis of LAD stent Continue metoprolol , Imdur  Continue Eliquis    Chronic hypercalcemia Patient with persistent hypercalcemia of unknown etiology.  Has had extensive prior workup.  Imaging including chest, abdomen, pelvis have been unrevealing.  She had negative workup for monoclonal gammopathy.  X-ray skeletal survey did not show evidence of bone lytic lesions.  PTH related peptide was normal Continue workup as outpatient   CKD stage IIIb Renal function stable Intermittent monitoring.   Hypertension Continue metoprolol  and Imdur .   Hyperlipidemia Does not appear to be on statin as an outpatient.   OSA Continue CPAP nightly  Hypoalbuminemia Noted significant weight loss, reports poor appetite Dietitian following.  Added nutrition.      Estimated body mass index is 19.74 kg/m as calculated from the following:   Height as of this encounter: 5' 8 (1.727 m).   Weight as of this encounter: 58.9 kg.  Code Status: Full  Family Communication: None at bedside.  Son Sallee called and updated 8/21.  Disposition Plan: Status is: Inpatient.  Medically  stabilized.  Declined SNF by insurance. Family coordinating more care at home before going home.   Consultants: Cardiology  Procedures: None  Antimicrobials: None  DVT prophylaxis: Eliquis    Objective: Vitals:   06/24/24 2229 06/24/24 2316 06/25/24 0400 06/25/24 0741  BP:  (!) 142/55 (!) 158/58 (!) 155/67  Pulse: (!) 59 60 60 (!) 59  Resp:  18 16 18   Temp:  100.1 F (37.8 C) 99.1 F (37.3 C) 98.5 F (36.9 C)  TempSrc:  Oral Oral Oral  SpO2: 96% 97% 97% 97%  Weight:      Height:        Intake/Output Summary (Last 24 hours) at 06/25/2024 1115 Last data filed at 06/25/2024 1024 Gross per 24 hour  Intake 840 ml  Output 1250 ml  Net -410 ml   Filed Weights   06/20/24 1535 06/20/24 2214 06/21/24 2039  Weight: 51.3 kg 58 kg 58.9 kg    General: Pleasant and interactive. Cardiovascular: S1-S2 normal.  Regular rate rhythm. Respiratory: Bilateral clear.  No added sounds. Gastrointestinal: Soft.  Nontender.  Bowel sound present. Ext: No swelling or edema.  No cyanosis. Neuro: Alert awake.  Gross generalized weakness. Musculoskeletal: No deformities.     Data Reviewed: CBC: Recent Labs  Lab 06/20/24 1538 06/21/24 0336 06/22/24 0529  WBC 4.0 3.4* 3.2*  NEUTROABS 3.1  --   --   HGB 11.7* 10.1* 9.9*  HCT 37.7 32.4* 30.5*  MCV 89.5 90.8 88.7  PLT 224 197 176   Basic Metabolic Panel: Recent Labs  Lab 06/18/24 1545 06/20/24 1538 06/21/24 0822 06/22/24 0529  NA 137 135 139 134*  K 4.2 3.5 3.5 3.5  CL 107 103 109 108  CO2 21 20* 23 22  GLUCOSE 103* 118* 92 88  BUN 30* 36* 29* 27*  CREATININE 1.30* 1.33* 1.30* 1.29*  CALCIUM  11.6* 11.2* 10.6* 10.1  MG 2.2 2.0  --   --   PHOS 2.6  --  2.7  --    GFR: Estimated Creatinine Clearance: 29.6 mL/min (A) (by C-G formula based on SCr of 1.29 mg/dL (H)). Liver Function Tests: Recent Labs  Lab 06/20/24 1538 06/21/24 0822  AST 43*  --   ALT 15  --   ALKPHOS 116  --   BILITOT 0.7  --   PROT 8.2*  --    ALBUMIN 2.9* 2.5*   No results for input(s): LIPASE, AMYLASE in the last 168 hours. No results for input(s): AMMONIA in the last 168 hours. Coagulation Profile: No results for input(s): INR, PROTIME in the last 168 hours. Cardiac Enzymes: No results for input(s): CKTOTAL, CKMB, CKMBINDEX, TROPONINI in the last 168 hours. BNP (last 3 results) No results for input(s): PROBNP in the last 8760 hours. HbA1C: No results for input(s): HGBA1C in the last 72 hours. CBG: No results for input(s): GLUCAP in the last 168 hours. Lipid Profile: No results for input(s): CHOL, HDL, LDLCALC, TRIG, CHOLHDL, LDLDIRECT in the last 72 hours. Thyroid  Function Tests: No results for input(s): TSH, T4TOTAL, FREET4, T3FREE, THYROIDAB in the last 72 hours.  Anemia Panel: No results for input(s): VITAMINB12, FOLATE, FERRITIN, TIBC, IRON , RETICCTPCT in the last 72 hours. Urine analysis:    Component Value Date/Time   COLORURINE YELLOW 06/20/2024 2116   APPEARANCEUR HAZY (A) 06/20/2024 2116   LABSPEC 1.020 06/20/2024 2116   PHURINE  5.0 06/20/2024 2116   GLUCOSEU >=500 (A) 06/20/2024 2116   GLUCOSEU NEGATIVE 09/13/2021 1203   HGBUR NEGATIVE 06/20/2024 2116   HGBUR negative 10/17/2010 0859   BILIRUBINUR NEGATIVE 06/20/2024 2116   BILIRUBINUR neg 08/01/2021 1037   KETONESUR NEGATIVE 06/20/2024 2116   PROTEINUR NEGATIVE 06/20/2024 2116   UROBILINOGEN 0.2 09/13/2021 1203   NITRITE NEGATIVE 06/20/2024 2116   LEUKOCYTESUR NEGATIVE 06/20/2024 2116   Sepsis Labs: @LABRCNTIP (procalcitonin:4,lacticidven:4)  ) Recent Results (from the past 240 hours)  Resp panel by RT-PCR (RSV, Flu A&B, Covid) Anterior Nasal Swab     Status: None   Collection Time: 06/20/24  4:12 PM   Specimen: Anterior Nasal Swab  Result Value Ref Range Status   SARS Coronavirus 2 by RT PCR NEGATIVE NEGATIVE Final    Comment: (NOTE) SARS-CoV-2 target nucleic acids are NOT  DETECTED.  The SARS-CoV-2 RNA is generally detectable in upper respiratory specimens during the acute phase of infection. The lowest concentration of SARS-CoV-2 viral copies this assay can detect is 138 copies/mL. A negative result does not preclude SARS-Cov-2 infection and should not be used as the sole basis for treatment or other patient management decisions. A negative result may occur with  improper specimen collection/handling, submission of specimen other than nasopharyngeal swab, presence of viral mutation(s) within the areas targeted by this assay, and inadequate number of viral copies(<138 copies/mL). A negative result must be combined with clinical observations, patient history, and epidemiological information. The expected result is Negative.  Fact Sheet for Patients:  BloggerCourse.com  Fact Sheet for Healthcare Providers:  SeriousBroker.it  This test is no t yet approved or cleared by the [place]  FDA and  has been authorized for detection and/or diagnosis of SARS-CoV-2 by FDA under an Emergency Use Authorization (EUA). This EUA will remain  in effect (meaning this test can be used) for the duration of the COVID-19 declaration under Section 564(b)(1) of the Act, 21 U.S.C.section 360bbb-3(b)(1), unless the authorization is terminated  or revoked sooner.       Influenza A by PCR NEGATIVE NEGATIVE Final   Influenza B by PCR NEGATIVE NEGATIVE Final    Comment: (NOTE) The Xpert Xpress SARS-CoV-2/FLU/RSV plus assay is intended as an aid in the diagnosis of influenza from Nasopharyngeal swab specimens and should not be used as a sole basis for treatment. Nasal washings and aspirates are unacceptable for Xpert Xpress SARS-CoV-2/FLU/RSV testing.  Fact Sheet for Patients: BloggerCourse.com  Fact Sheet for Healthcare Providers: SeriousBroker.it  This test is not yet  approved or cleared by the [place]  FDA and has been authorized for detection and/or diagnosis of SARS-CoV-2 by FDA under an Emergency Use Authorization (EUA). This EUA will remain in effect (meaning this test can be used) for the duration of the COVID-19 declaration under Section 564(b)(1) of the Act, 21 U.S.C. section 360bbb-3(b)(1), unless the authorization is terminated or revoked.     Resp Syncytial Virus by PCR NEGATIVE NEGATIVE Final    Comment: (NOTE) Fact Sheet for Patients: BloggerCourse.com  Fact Sheet for Healthcare Providers: SeriousBroker.it  This test is not yet approved or cleared by the [place]  FDA and has been authorized for detection and/or diagnosis of SARS-CoV-2 by FDA under an Emergency Use Authorization (EUA). This EUA will remain in effect (meaning this test can be used) for the duration of the COVID-19 declaration under Section 564(b)(1) of the Act, 21 U.S.C. section 360bbb-3(b)(1), unless the authorization is terminated or revoked.  Performed at [facility], [address]  [place], [place] [address]       Studies: No results found.   Scheduled Meds:  amiodarone   200 mg Oral BID   apixaban   2.5 mg Oral BID   empagliflozin   10 mg Oral Daily   feeding supplement  237 mL Oral BID BM   isosorbide  mononitrate  30 mg Oral Daily   megestrol   40 mg Oral q AM   metoprolol  tartrate  25 mg Oral BID   multivitamin with minerals  1 tablet Oral Daily   pantoprazole   40 mg Oral Daily   sodium chloride  flush  3 mL Intravenous Q12H   sucralfate   1 g Oral BID   cyanocobalamin   500 mcg Oral Daily    Continuous Infusions:     LOS: 4 days     Renato Applebaum, MD

## 2024-06-25 NOTE — Telephone Encounter (Signed)
 Copied from CRM [id number]. Topic: Clinical - Medical Advice >> Jun 25, 2024  4:06 PM Anairis L wrote: Reason for CRM: Ted with [facility] is requesting a call back for order clarification [phone]. Informed me Mrs. Ernesta son has a lot of question regarding the need of HH for his mother. She is also still in the hospital. Thank you.

## 2024-06-25 NOTE — TOC Progression Note (Signed)
 Transition of Care (TOC) - Progression Note  Rayfield Gobble RN, BSN Inpatient Care Management Unit 4E- RN Case Manager See Treatment Team for direct phone # [phone] cross coverage   Patient Details  Name: Barbara [name] MRN: [id number] Date of Birth: 10-Jul-1941  Transition of Care [facility]) CM/SW Contact  Gobble, Rayfield Hurst, RN Phone Number: 06/25/2024, 12:45 PM  Clinical Narrative:    Per MD pt medically stable for transition home. Per previous notes son is working on in home care for safe discharge.  TC made to son- Sallee to check in on plans.  Per Sallee he voiced that provider told him that paperwork for pt's custodial care was going to be submitted from the MD here- explained to pt that usually the PCP will do this paperwork not the hospital- explained that the hospital MD has done the orders for skilled [facility] needs -RN/PT/OT/aide/SW.  Son voiced that he has attempted to call the PCP office and has left msg but has not heard back from PCP.  Pt has a brother and sister local that can meet pt at the home on discharge but are unable to provide any care per son.   Pt has been approved for PTAR transport per HTA- son aware and states he will just need to know timing of transport on discharge so he can made sure his aunt/uncle are there at the home to meet them.   CM has sent an in-basket msg to Dr. Johnny and PCP office regarding the pre-auth needed for in home custodial care under pt's HTA benefits.   IP CM will need to f/u on Monday regarding son's plan and if PCP office has submitted needed paperwork.     Expected Discharge Plan:  (TBD) Barriers to Discharge: Continued Medical Work up               Expected Discharge Plan and Services       Living arrangements for the past 2 months: Single Family Home                                       Social Drivers of Health (SDOH) Interventions SDOH Screenings   Food Insecurity: No Food Insecurity (06/20/2024)  Housing:  High Risk (06/20/2024)  Transportation Needs: Unmet Transportation Needs (06/20/2024)  Utilities: Not At Risk (06/20/2024)  Alcohol Screen: Low Risk  (08/05/2022)  Depression (PHQ2-9): Low Risk  (04/05/2024)  Financial Resource Strain: Low Risk  (08/05/2022)  Physical Activity: Insufficiently Active (07/02/2022)  Social Connections: Moderately Integrated (06/20/2024)  Stress: No Stress Concern Present (07/02/2022)  Tobacco Use: Medium Risk (06/20/2024)    Readmission Risk Interventions    05/24/2024    8:57 AM 03/11/2024    1:07 PM  Readmission Risk Prevention Plan  Transportation Screening Complete Complete  PCP or Specialist Appt within 5-7 Days  Complete  Home Care Screening  Complete  Medication Review (RN CM)  Complete  HRI or Home Care Consult Complete   Social Work Consult for Recovery Care Planning/Counseling Complete   Palliative Care Screening Not Applicable   Medication Review Oceanographer) Complete

## 2024-06-26 ENCOUNTER — Other Ambulatory Visit (HOSPITAL_COMMUNITY): Payer: Self-pay

## 2024-06-26 DIAGNOSIS — I480 Paroxysmal atrial fibrillation: Secondary | ICD-10-CM | POA: Diagnosis not present

## 2024-06-26 MED ORDER — AMIODARONE HCL 200 MG PO TABS
200.0000 mg | ORAL_TABLET | Freq: Two times a day (BID) | ORAL | 0 refills | Status: DC
  Filled 2024-06-26: qty 60, 30d supply, fill #0

## 2024-06-26 MED ORDER — SUCRALFATE 1 GM/10ML PO SUSP
1.0000 g | Freq: Two times a day (BID) | ORAL | 0 refills | Status: DC
  Filled 2024-06-26: qty 420, 21d supply, fill #0

## 2024-06-26 MED ORDER — ADULT MULTIVITAMIN W/MINERALS CH
1.0000 | ORAL_TABLET | Freq: Every day | ORAL | 0 refills | Status: DC
  Filled 2024-06-26: qty 30, 30d supply, fill #0

## 2024-06-26 MED ORDER — METOPROLOL TARTRATE 25 MG PO TABS
25.0000 mg | ORAL_TABLET | Freq: Two times a day (BID) | ORAL | 0 refills | Status: DC
  Filled 2024-06-26: qty 60, 30d supply, fill #0

## 2024-06-26 NOTE — Progress Notes (Signed)
 Physical Therapy Treatment Patient Details Name: Barbara [name] MRN: [id number] DOB: 11-01-1941 Today's Date: 06/26/2024   History of Present Illness 83 yo female adm 06/20/24 with SOB, chest pain, AFib with RVR. PMH: CAD, HFpEF, A-fib on Eliquis , essential HTN, HLD, anxiety, CKD, SSS s/p PPM, anemia, Rt TSA, vertigo, Lt femur fx    PT Comments  Pt agreeable to session with focus on progressing mobility, strength, endurance, and independence with transfers. Pt continues to require minA to rise to standing from all surfaces (bed, BSC, and recliner) in session. Once standing, pt able to complete short bout of ambulation with CGA but had no overt LOB this session. Continue to recommend skilled inpatient rehab <3hours/day to improve functional strength and independence so that the pt is able to mobilize in her home. Since she remains unable to stand without assistance and insurance has denied recommendation for further rehab, will need max HH services and aide time as pt is unable to mobilize on her own.     If plan is discharge home, recommend the following: A little help with walking and/or transfers;A little help with bathing/dressing/bathroom;Supervision due to cognitive status;Assistance with cooking/housework;Direct supervision/assist for medications management;Direct supervision/assist for financial management;Assist for transportation;Help with stairs or ramp for entrance   Can travel by private vehicle     Yes  Equipment Recommendations  None recommended by PT    Recommendations for Other Services       Precautions / Restrictions Precautions Precautions: Fall Recall of Precautions/Restrictions: Impaired Restrictions Weight Bearing Restrictions Per Provider Order: No     Mobility  Bed Mobility Overal bed mobility: Needs Assistance Bed Mobility: Supine to Sit     Supine to sit: Supervision     General bed mobility comments: HOB at 30 deg without use of rail or assist.  increased time    Transfers Overall transfer level: Needs assistance Equipment used: Rolling walker (2 wheels) Transfers: Sit to/from Stand, Bed to chair/wheelchair/BSC Sit to Stand: Min assist, Mod assist   Step pivot transfers: Contact guard assist       General transfer comment: mod-minA to rise on initial stand, CGA to pivot to [facility]. only able to completed 3/5 sit-stand from recliner, dependent on UE support and minA    Ambulation/Gait Ambulation/Gait assistance: Contact guard assist Gait Distance (Feet): 75 Feet Assistive device: Rolling walker (2 wheels) Gait Pattern/deviations: Step-through pattern, Decreased stride length, Shuffle, Leaning posteriorly Gait velocity: decreased Gait velocity interpretation: <1.31 ft/sec, indicative of household ambulator   General Gait Details: no overt LOB this session but increased sway with turning      Balance Overall balance assessment: Needs assistance Sitting-balance support: No upper extremity supported, Feet supported Sitting balance-Leahy Scale: Fair Sitting balance - Comments: EOB without support   Standing balance support: Bilateral upper extremity supported, During functional activity, Reliant on assistive device for balance Standing balance-Leahy Scale: Poor Standing balance comment: RW in standing, min assist with gait                            Communication Communication Communication: No apparent difficulties  Cognition Arousal: Alert Behavior During Therapy: WFL for tasks assessed/performed   PT - Cognitive impairments: Safety/Judgement, Problem solving                       PT - Cognition Comments: decreased awareness of deficits with slow processing Following commands: Impaired Following commands impaired: Follows one step commands with  increased time    Cueing Cueing Techniques: Verbal cues  Exercises Other Exercises Other Exercises: 5x sit-stand from EOB, only completed 3 due to  pain and fatigue. dependent on UE support and minA    General Comments General comments (skin integrity, edema, etc.): VSS on RA      Pertinent Vitals/Pain Pain Assessment Pain Assessment: Faces Faces Pain Scale: Hurts little more Pain Location: back and RLE with stance Pain Descriptors / Indicators: Discomfort, Aching, Sharp Pain Intervention(s): Limited activity within patient's tolerance, Monitored during session, Repositioned     PT Goals (current goals can now be found in the care plan section) Acute Rehab PT Goals Patient Stated Goal: to return home PT Goal Formulation: With patient Time For Goal Achievement: 07/07/24 Potential to Achieve Goals: Good Progress towards PT goals: Progressing toward goals    Frequency    Min 2X/week       AM-PAC PT 6 Clicks Mobility   Outcome Measure  Help needed turning from your back to your side while in a flat bed without using bedrails?: A Little Help needed moving from lying on your back to sitting on the side of a flat bed without using bedrails?: A Little Help needed moving to and from a bed to a chair (including a wheelchair)?: A Little Help needed standing up from a chair using your arms (e.g., wheelchair or bedside chair)?: A Little Help needed to walk in hospital room?: A Little Help needed climbing 3-5 steps with a railing? : Total 6 Click Score: 16    End of Session Equipment Utilized During Treatment: Gait belt Activity Tolerance: Patient tolerated treatment well;Patient limited by fatigue Patient left: in chair;with call bell/phone within reach;with chair alarm set Nurse Communication: Mobility status PT Visit Diagnosis: Unsteadiness on feet (R26.81);Muscle weakness (generalized) (M62.81);Other abnormalities of gait and mobility (R26.89);Difficulty in walking, not elsewhere classified (R26.2)     Time: 8855-8768 PT Time Calculation (min) (ACUTE ONLY): 47 min  Charges:    $Gait Training: 8-22  mins $Therapeutic Exercise: 8-22 mins $Therapeutic Activity: 8-22 mins PT General Charges $$ ACUTE PT VISIT: 1 Visit                     Izetta Call, PT, DPT   Acute Rehabilitation Department Office [phone] Secure Chat Communication Preferred   Izetta JULIANNA Call 06/26/2024, 12:38 PM

## 2024-06-26 NOTE — Discharge Summary (Signed)
 Physician Discharge Summary  Barbara [name] FMW:[id number] DOB: 20-Jan-1941 DOA: 06/20/2024  PCP: Johnny Garnette LABOR, MD  Admit date: 06/20/2024 Discharge date: 06/26/2024  Admitted From: Home Disposition: Home  Recommendations for Outpatient Follow-up:  Follow up with PCP in 1-2 weeks Follow-up with cardiology scheduled  Home Health: PT, OT, nursing, aide, social worker Equipment/Devices: None  Discharge Condition: Stable CODE STATUS: Full Diet recommendation: Regular diet  Brief/Interim Summary: Barbara [name] is a 83 y.o. female with medical history significant for CAD (s/p PCI LAD 2000), PAF on Eliquis , chronic HFpEF, apical HCM, SSS s/p PPM, CKD stage IIIb, chronic hypercalcemia, HTN, HLD, chronic anemia, OSA who presented to the ED for evaluation of progressive SOB, palpitations, chest pain for the past couple of days, and unintentional weight loss for months. In the ED, VS with HR in the 160s, otherwise fairly stable. Labs showed troponin 408 > 398, BNP 980.3, calcium  11.2. SARS-CoV-2, influenza, RSV PCR negative.  UA negative for any infection. Portable chest x-ray negative for focal consolidation, edema, effusion. CTA chest negative for PE.  Cardiology consulted.  [facility] admitted patient for further management.  Patient admitted as above with paroxysmal A-fib with RVR now converted to sinus rhythm, notable history of sick sinus syndrome status post pacemaker also.  At this time given stable nature will continue on medications as below.  Chronic comorbid conditions including heart failure CAD hypercalcemia CKD hypertension hyperlipidemia sleep apnea all appear to be well-controlled.  Patient otherwise stable and agreeable for discharge home, family arranging for caretaker given patient's needs.   Discharge Diagnoses:  Principal Problem:   Paroxysmal atrial fibrillation with rapid ventricular response (HCC) Active Problems:   CAD (coronary artery  disease/elevated troponin   Essential hypertension   Chronic kidney disease, stage 3b (HCC)   Hyperlipidemia   OSA (obstructive sleep apnea)   Chronic heart failure with preserved ejection fraction (HFpEF) (HCC)   Hypercalcemia    Discharge Instructions  Discharge Instructions     Call MD for:  difficulty breathing, headache or visual disturbances   Complete by: As directed    Call MD for:  extreme fatigue   Complete by: As directed    Call MD for:  hives   Complete by: As directed    Call MD for:  persistant dizziness or light-headedness   Complete by: As directed    Call MD for:  persistant nausea and vomiting   Complete by: As directed    Call MD for:  severe uncontrolled pain   Complete by: As directed    Call MD for:  temperature >100.4   Complete by: As directed    Diet - low sodium heart healthy   Complete by: As directed    Increase activity slowly   Complete by: As directed       Allergies as of 06/26/2024       Reactions   Lidocaine  Anaphylaxis   Morphine  Other (See Comments)   Body shuts down   Procaine Hcl Anaphylaxis, Rash, Other (See Comments)   Anything with 'caine' in it    Sulfonamide Derivatives Hives   Amlodipine  Swelling   Ezetimibe-simvastatin Other (See Comments)   VYTORIN = Joint pain   Latex Itching, Other (See Comments)   WELTS   Norco [hydrocodone -acetaminophen ] Nausea And Vomiting, Other (See Comments)   States does ok with IV form    Tape Itching, Other (See Comments)   Patient prefers either paper tape or Coban wrap   Tramadol  Nausea And Vomiting  Medication List     STOP taking these medications    hydrALAZINE  100 MG tablet Commonly known as: APRESOLINE    methocarbamol  500 MG tablet Commonly known as: ROBAXIN    metoprolol  succinate 25 MG 24 hr tablet Commonly known as: TOPROL -XL       TAKE these medications    acetaminophen  325 MG tablet Commonly known as: TYLENOL  Take 2 tablets (650 mg total) by  mouth every 6 (six) hours as needed for mild pain (pain score 1-3) or fever (or Fever >/= 101).   alum & mag hydroxide-simeth 200-200-20 MG/5ML suspension Commonly known as: MAALOX/MYLANTA Take 30 mLs by mouth every 4 (four) hours as needed for indigestion.   amiodarone  200 MG tablet Commonly known as: PACERONE  Take 1 tablet (200 mg total) by mouth 2 (two) times daily.   apixaban  2.5 MG Tabs tablet Commonly known as: Eliquis  Take 1 tablet (2.5 mg total) by mouth 2 (two) times daily.   BOOST/FIBER PO Take 1 Bottle by mouth 3 (three) times daily.   cyanocobalamin  500 MCG tablet Commonly known as: VITAMIN B12 Take 500 mcg by mouth daily.   diclofenac  Sodium 1 % Gel Commonly known as: VOLTAREN  Apply 2 g topically 4 (four) times daily for 7 days, THEN 2 g 4 (four) times daily as needed. Apply to right lower ribs. Start taking on: May 24, 2024 What changed: See the new instructions.   empagliflozin  10 MG Tabs tablet Commonly known as: Jardiance  Take 1 tablet (10 mg total) by mouth daily.   furosemide  20 MG tablet Commonly known as: LASIX  Take 10 mg by mouth daily as needed (for swelling of the ankles).   ipratropium 0.06 % nasal spray Commonly known as: ATROVENT  Place 2 sprays into both nostrils 2 (two) times daily as needed (allergies).   isosorbide  mononitrate 30 MG 24 hr tablet Commonly known as: IMDUR  Take 1 tablet (30 mg total) by mouth daily.   ketoconazole  2 % cream Commonly known as: NIZORAL  Apply 1 Application topically 2 (two) times daily as needed for irritation.   loratadine  10 MG tablet Commonly known as: CLARITIN  Take 10 mg by mouth daily as needed for allergies.   megestrol  40 MG tablet Commonly known as: MEGACE  Take 1 tablet (40 mg total) by mouth in the morning.   metoprolol  tartrate 25 MG tablet Commonly known as: LOPRESSOR  Take 1 tablet (25 mg total) by mouth 2 (two) times daily.   mirtazapine  15 MG tablet Commonly known as: REMERON  Take 1  tablet (15 mg total) by mouth at bedtime.   multivitamin with minerals Tabs tablet Take 1 tablet by mouth daily. Start taking on: June 27, 2024   nitroGLYCERIN  0.4 MG SL tablet Commonly known as: NITROSTAT  Place 1 tablet (0.4 mg total) under the tongue every 5 (five) minutes as needed for chest pain.   pantoprazole  40 MG tablet Commonly known as: PROTONIX  Take 1 tablet (40 mg total) by mouth daily.   polyethylene glycol 17 g packet Commonly known as: MIRALAX  / GLYCOLAX  Take 17 g by mouth 2 (two) times daily.   senna-docusate 8.6-50 MG tablet Commonly known as: Senokot-S Take 1 tablet by mouth 2 (two) times daily.   sucralfate  1 GM/10ML suspension Commonly known as: CARAFATE  Take 10 mLs (1 g total) by mouth 2 (two) times daily.   triamcinolone  55 MCG/ACT nasal inhaler Commonly known as: NASACORT Place 1 spray into both nostrils daily as needed (for allergies).        Follow-up Information  Johnny Garnette LABOR, MD Follow up on 06/28/2024.   Specialty: Family Medicine Why: 2:30 for hospital follow up, Contact information: [address] [place] [facility] [place] [address] [phone]                Allergies  Allergen Reactions   Lidocaine  Anaphylaxis   Morphine  Other (See Comments)    Body shuts down   Procaine Hcl Anaphylaxis, Rash and Other (See Comments)    Anything with 'caine' in it    Sulfonamide Derivatives Hives   Amlodipine  Swelling   Ezetimibe-Simvastatin Other (See Comments)    VYTORIN = Joint pain   Latex Itching and Other (See Comments)    WELTS   Norco [Hydrocodone -Acetaminophen ] Nausea And Vomiting and Other (See Comments)    States does ok with IV form    Tape Itching and Other (See Comments)    Patient prefers either paper tape or Coban wrap   Tramadol  Nausea And Vomiting    Consultations: Cardiology   Procedures/Studies: CT Angio Chest PE W and/or Wo Contrast Result Date: 06/20/2024 CLINICAL DATA:  Baseline short of breath.  Increased shortness of breath today with leg swelling. EXAM: CT ANGIOGRAPHY CHEST WITH CONTRAST TECHNIQUE: Multidetector CT imaging of the chest was performed using the standard protocol during bolus administration of intravenous contrast. Multiplanar CT image reconstructions and MIPs were obtained to evaluate the vascular anatomy. RADIATION DOSE REDUCTION: This exam was performed according to the departmental dose-optimization program which includes automated exposure control, adjustment of the mA and/or kV according to patient size and/or use of iterative reconstruction technique. CONTRAST:  70mL OMNIPAQUE  IOHEXOL  350 MG/ML SOLN COMPARISON:  Same day chest radiograph and CT chest 05/22/2024 FINDINGS: Cardiovascular: Negative for pulmonary embolism. Normal caliber thoracic aorta. Coronary artery and aortic atherosclerotic calcification. No pericardial effusion. Left chest wall pacemaker. Mediastinum/Nodes: Trachea and esophagus are unremarkable. No pathologic adenopathy. Lungs/Pleura: Biapical pleuroparenchymal scarring and atelectasis in the lower lobes. Trace right pleural effusion. No pneumothorax. Upper Abdomen: No acute abnormality. Musculoskeletal: No acute fracture.  Right reverse TSA. Review of the MIP images confirms the above findings. IMPRESSION: 1. Negative for pulmonary embolism. 2. Trace right pleural effusion, unchanged from 05/22/2024. 3. Aortic Atherosclerosis (ICD10-I70.0). Electronically Signed   By: Norman Gatlin M.D.   On: 06/20/2024 21:26   DG Chest Portable 1 View Result Date: 06/20/2024 CLINICAL DATA:  Short of breath EXAM: PORTABLE CHEST 1 VIEW COMPARISON:  None Available. FINDINGS: LEFT-sided pacer overlies normal cardiac silhouette. Chronic elevation LEFT hemidiaphragm. No effusion, infiltrate, pneumothorax. RIGHT shoulder arthroplasty IMPRESSION: No acute cardiopulmonary process. Electronically Signed   By: Jackquline Boxer M.D.   On: 06/20/2024 16:27     Subjective: No acute  issues or events overnight   Discharge Exam: Vitals:   06/26/24 0735 06/26/24 0917  BP: (!) 150/66 (!) 160/66  Pulse: (!) 58 93  Resp: 18 18  Temp: 98.1 F (36.7 C)   SpO2: 97% 99%   Vitals:   06/25/24 2334 06/26/24 0348 06/26/24 0735 06/26/24 0917  BP: (!) 148/60 (!) 143/64 (!) 150/66 (!) 160/66  Pulse: 60 61 (!) 58 93  Resp: 18 18 18 18   Temp: 98.1 F (36.7 C) 99.1 F (37.3 C) 98.1 F (36.7 C)   TempSrc: Oral Oral Oral   SpO2: 95% 99% 97% 99%  Weight:      Height:        General: Pt is alert, awake, not in acute distress Cardiovascular: RRR, S1/S2 +, no rubs, no gallops Respiratory: CTA bilaterally, no  wheezing, no rhonchi Abdominal: Soft, NT, ND, bowel sounds + Extremities: no edema, no cyanosis    The results of significant diagnostics from this hospitalization (including imaging, microbiology, ancillary and laboratory) are listed below for reference.     Microbiology: Recent Results (from the past 240 hours)  Resp panel by RT-PCR (RSV, Flu A&B, Covid) Anterior Nasal Swab     Status: None   Collection Time: 06/20/24  4:12 PM   Specimen: Anterior Nasal Swab  Result Value Ref Range Status   SARS Coronavirus 2 by RT PCR NEGATIVE NEGATIVE Final    Comment: (NOTE) SARS-CoV-2 target nucleic acids are NOT DETECTED.  The SARS-CoV-2 RNA is generally detectable in upper respiratory specimens during the acute phase of infection. The lowest concentration of SARS-CoV-2 viral copies this assay can detect is 138 copies/mL. A negative result does not preclude SARS-Cov-2 infection and should not be used as the sole basis for treatment or other patient management decisions. A negative result may occur with  improper specimen collection/handling, submission of specimen other than nasopharyngeal swab, presence of viral mutation(s) within the areas targeted by this assay, and inadequate number of viral copies(<138 copies/mL). A negative result must be combined with clinical  observations, patient history, and epidemiological information. The expected result is Negative.  Fact Sheet for Patients:  BloggerCourse.com  Fact Sheet for Healthcare Providers:  SeriousBroker.it  This test is no t yet approved or cleared by the [place]  FDA and  has been authorized for detection and/or diagnosis of SARS-CoV-2 by FDA under an Emergency Use Authorization (EUA). This EUA will remain  in effect (meaning this test can be used) for the duration of the COVID-19 declaration under Section 564(b)(1) of the Act, 21 U.S.C.section 360bbb-3(b)(1), unless the authorization is terminated  or revoked sooner.       Influenza A by PCR NEGATIVE NEGATIVE Final   Influenza B by PCR NEGATIVE NEGATIVE Final    Comment: (NOTE) The Xpert Xpress SARS-CoV-2/FLU/RSV plus assay is intended as an aid in the diagnosis of influenza from Nasopharyngeal swab specimens and should not be used as a sole basis for treatment. Nasal washings and aspirates are unacceptable for Xpert Xpress SARS-CoV-2/FLU/RSV testing.  Fact Sheet for Patients: BloggerCourse.com  Fact Sheet for Healthcare Providers: SeriousBroker.it  This test is not yet approved or cleared by the [place]  FDA and has been authorized for detection and/or diagnosis of SARS-CoV-2 by FDA under an Emergency Use Authorization (EUA). This EUA will remain in effect (meaning this test can be used) for the duration of the COVID-19 declaration under Section 564(b)(1) of the Act, 21 U.S.C. section 360bbb-3(b)(1), unless the authorization is terminated or revoked.     Resp Syncytial Virus by PCR NEGATIVE NEGATIVE Final    Comment: (NOTE) Fact Sheet for Patients: BloggerCourse.com  Fact Sheet for Healthcare Providers: SeriousBroker.it  This test is not yet approved or cleared by  the [place]  FDA and has been authorized for detection and/or diagnosis of SARS-CoV-2 by FDA under an Emergency Use Authorization (EUA). This EUA will remain in effect (meaning this test can be used) for the duration of the COVID-19 declaration under Section 564(b)(1) of the Act, 21 U.S.C. section 360bbb-3(b)(1), unless the authorization is terminated or revoked.  Performed at [facility], [address] [place], [place] [address]      Labs: BNP (last 3 results) Recent Labs    05/18/24 1132 06/20/24 1538  BNP 852.3* 980.3*   Basic Metabolic Panel: Recent Labs  Lab 06/20/24 1538 06/21/24 0822 06/22/24 0529  NA 135 139 134*  K 3.5 3.5 3.5  CL 103 109 108  CO2 20* 23 22  GLUCOSE 118* 92 88  BUN 36* 29* 27*  CREATININE 1.33* 1.30* 1.29*  CALCIUM  11.2* 10.6* 10.1  MG 2.0  --   --   PHOS  --  2.7  --    Liver Function Tests: Recent Labs  Lab 06/20/24 1538 06/21/24 0822  AST 43*  --   ALT 15  --   ALKPHOS 116  --   BILITOT 0.7  --   PROT 8.2*  --   ALBUMIN 2.9* 2.5*   No results for input(s): LIPASE, AMYLASE in the last 168 hours. No results for input(s): AMMONIA in the last 168 hours. CBC: Recent Labs  Lab 06/20/24 1538 06/21/24 0336 06/22/24 0529  WBC 4.0 3.4* 3.2*  NEUTROABS 3.1  --   --   HGB 11.7* 10.1* 9.9*  HCT 37.7 32.4* 30.5*  MCV 89.5 90.8 88.7  PLT 224 197 176   Cardiac Enzymes: No results for input(s): CKTOTAL, CKMB, CKMBINDEX, TROPONINI in the last 168 hours. BNP: Invalid input(s): POCBNP CBG: No results for input(s): GLUCAP in the last 168 hours. D-Dimer No results for input(s): DDIMER in the last 72 hours. Hgb A1c No results for input(s): HGBA1C in the last 72 hours. Lipid Profile No results for input(s): CHOL, HDL, LDLCALC, TRIG, CHOLHDL, LDLDIRECT in the last 72 hours. Thyroid  function studies No results for input(s): TSH, T4TOTAL, T3FREE, THYROIDAB in the last  72 hours.  Invalid input(s): FREET3 Anemia work up No results for input(s): VITAMINB12, FOLATE, FERRITIN, TIBC, IRON , RETICCTPCT in the last 72 hours. Urinalysis    Component Value Date/Time   COLORURINE YELLOW 06/20/2024 2116   APPEARANCEUR HAZY (A) 06/20/2024 2116   LABSPEC 1.020 06/20/2024 2116   PHURINE 5.0 06/20/2024 2116   GLUCOSEU >=500 (A) 06/20/2024 2116   GLUCOSEU NEGATIVE 09/13/2021 1203   HGBUR NEGATIVE 06/20/2024 2116   HGBUR negative 10/17/2010 0859   BILIRUBINUR NEGATIVE 06/20/2024 2116   BILIRUBINUR neg 08/01/2021 1037   KETONESUR NEGATIVE 06/20/2024 2116   PROTEINUR NEGATIVE 06/20/2024 2116   UROBILINOGEN 0.2 09/13/2021 1203   NITRITE NEGATIVE 06/20/2024 2116   LEUKOCYTESUR NEGATIVE 06/20/2024 2116   Sepsis Labs Recent Labs  Lab 06/20/24 1538 06/21/24 0336 06/22/24 0529  WBC 4.0 3.4* 3.2*   Microbiology Recent Results (from the past 240 hours)  Resp panel by RT-PCR (RSV, Flu A&B, Covid) Anterior Nasal Swab     Status: None   Collection Time: 06/20/24  4:12 PM   Specimen: Anterior Nasal Swab  Result Value Ref Range Status   SARS Coronavirus 2 by RT PCR NEGATIVE NEGATIVE Final    Comment: (NOTE) SARS-CoV-2 target nucleic acids are NOT DETECTED.  The SARS-CoV-2 RNA is generally detectable in upper respiratory specimens during the acute phase of infection. The lowest concentration of SARS-CoV-2 viral copies this assay can detect is 138 copies/mL. A negative result does not preclude SARS-Cov-2 infection and should not be used as the sole basis for treatment or other patient management decisions. A negative result may occur with  improper specimen collection/handling, submission of specimen other than nasopharyngeal swab, presence of viral mutation(s) within the areas targeted by this assay, and inadequate number of viral copies(<138 copies/mL). A negative result must be combined with clinical observations, patient history, and  epidemiological information. The expected result is Negative.  Fact Sheet for Patients:  BloggerCourse.com  Fact Sheet  for Healthcare Providers:  SeriousBroker.it  This test is no t yet approved or cleared by the [place]  FDA and  has been authorized for detection and/or diagnosis of SARS-CoV-2 by FDA under an Emergency Use Authorization (EUA). This EUA will remain  in effect (meaning this test can be used) for the duration of the COVID-19 declaration under Section 564(b)(1) of the Act, 21 U.S.C.section 360bbb-3(b)(1), unless the authorization is terminated  or revoked sooner.       Influenza A by PCR NEGATIVE NEGATIVE Final   Influenza B by PCR NEGATIVE NEGATIVE Final    Comment: (NOTE) The Xpert Xpress SARS-CoV-2/FLU/RSV plus assay is intended as an aid in the diagnosis of influenza from Nasopharyngeal swab specimens and should not be used as a sole basis for treatment. Nasal washings and aspirates are unacceptable for Xpert Xpress SARS-CoV-2/FLU/RSV testing.  Fact Sheet for Patients: BloggerCourse.com  Fact Sheet for Healthcare Providers: SeriousBroker.it  This test is not yet approved or cleared by the [place]  FDA and has been authorized for detection and/or diagnosis of SARS-CoV-2 by FDA under an Emergency Use Authorization (EUA). This EUA will remain in effect (meaning this test can be used) for the duration of the COVID-19 declaration under Section 564(b)(1) of the Act, 21 U.S.C. section 360bbb-3(b)(1), unless the authorization is terminated or revoked.     Resp Syncytial Virus by PCR NEGATIVE NEGATIVE Final    Comment: (NOTE) Fact Sheet for Patients: BloggerCourse.com  Fact Sheet for Healthcare Providers: SeriousBroker.it  This test is not yet approved or cleared by the [place]  FDA and has been  authorized for detection and/or diagnosis of SARS-CoV-2 by FDA under an Emergency Use Authorization (EUA). This EUA will remain in effect (meaning this test can be used) for the duration of the COVID-19 declaration under Section 564(b)(1) of the Act, 21 U.S.C. section 360bbb-3(b)(1), unless the authorization is terminated or revoked.  Performed at [facility], [address] [place], [place] [address]      Time coordinating discharge: Over 30 minutes  SIGNED:   Elsie JAYSON Montclair, DO [facility] 06/26/2024, 12:09 PM Pager   If 7PM-7AM, please contact night-coverage [email or web address]

## 2024-06-26 NOTE — TOC Progression Note (Addendum)
 Transition of Care [facility]) - Progression Note    Patient Details  Name: Barbara [name] MRN: [id number] Date of Birth: [birth year]  [facility] CM/SW Contact  Marval Gell, RN Phone Number: 06/26/2024, 5:09 PM  Clinical Narrative:     [name] w son Sallee re DC plan.  DC order in place for patient to DC to home, [facility] denied SNF. Patient requires custodial care at home, patient does not have family that would be able to support this.  ICM and son working on custodial care support through HTA. Bedside nursing reports that son stated he has custodial care lined up for Monday. Although medically stable from physician standpoint, son and patient feel she is not safe for DC home until support is available. Discussed with son and reviewed process for filing an appeal to DC. Also reviewed case with I[employer] leadership.  If [facility] agrees with physician DC, patient will have until noon of the following day to DC.  Patient is not required to leave at this time. Patient and nursing updated.   HH- [address] Transport [facility]-  [place] by [facility] [phone]   [name], [name] (Son) [phone]  Son resides in [place]   Expected Discharge Plan:  (TBD) Barriers to Discharge: Continued Medical Work up               Expected Discharge Plan and Services       Living arrangements for the past 2 months: Single Family Home Expected Discharge Date: 06/26/24                                     Social Drivers of Health (SDOH) Interventions SDOH Screenings   Food Insecurity: No Food Insecurity (06/20/2024)  Housing: High Risk (06/20/2024)  Transportation Needs: Unmet Transportation Needs (06/20/2024)  Utilities: Not At Risk (06/20/2024)  Alcohol Screen: Low Risk  (08/05/2022)  Depression (PHQ2-9): Low Risk  (04/05/2024)  Financial Resource Strain: Low Risk  (08/05/2022)  Physical Activity: Insufficiently Active (07/02/2022)  Social Connections: Moderately Integrated (06/20/2024)  Stress: No  Stress Concern Present (07/02/2022)  Tobacco Use: Medium Risk (06/20/2024)    Readmission Risk Interventions    05/24/2024    8:57 AM 03/11/2024    1:07 PM  Readmission Risk Prevention Plan  Transportation Screening Complete Complete  PCP or Specialist Appt within 5-7 Days  Complete  Home Care Screening  Complete  Medication Review (RN CM)  Complete  HRI or Home Care Consult Complete   Social Work Consult for Recovery Care Planning/Counseling Complete   Palliative Care Screening Not Applicable   Medication Review Oceanographer) Complete

## 2024-06-27 DIAGNOSIS — I480 Paroxysmal atrial fibrillation: Secondary | ICD-10-CM | POA: Diagnosis not present

## 2024-06-27 NOTE — Progress Notes (Addendum)
   06/27/24 0827  [facility] ([employer] Appeal)  [facility] ([employer]) ROI Document Created Yes  [facility] ([employer]) appeal documents uploaded to [facility] ([employer]) site Yes   IM, DND, and HINN 12 completed   Confirmation # [id number] The files have been received for the Appeals Case [phone]. This does not confirm they are timely.  15:30 Spoke w son who has been notified of appeal decision, and has decided to apply for reconsideration of appeal. Patient provided with [facility] fax, copy placed on chart.

## 2024-06-27 NOTE — Progress Notes (Signed)
   06/27/24 2219  BiPAP/CPAP/SIPAP  BiPAP/CPAP/SIPAP Pt Type Adult  Reason BIPAP/CPAP not in use Non-compliant  BiPAP/CPAP /SiPAP Vitals  Pulse Rate 60  Resp 16  SpO2 95 %  Bilateral Breath Sounds Clear

## 2024-06-27 NOTE — Progress Notes (Signed)
 Physician Discharge Summary  ECKO BEASLEY FMW:[id number] DOB: 06-08-1941 DOA: 06/20/2024  PCP: Johnny Garnette LABOR, MD  Admit date: 06/20/2024 Discharge date: 06/28/24  Admitted From: Home Disposition: Home  Recommendations for Outpatient Follow-up:  Follow up with PCP in 1-2 weeks Follow-up with cardiology scheduled  Home Health: PT, OT, nursing, aide, social worker Equipment/Devices: None  Discharge Condition: Stable CODE STATUS: Full Diet recommendation: Regular diet  Brief/Interim Summary: Barbara [name] is a 83 y.o. female with medical history significant for CAD (s/p PCI LAD 2000), PAF on Eliquis , chronic HFpEF, apical HCM, SSS s/p PPM, CKD stage IIIb, chronic hypercalcemia, HTN, HLD, chronic anemia, OSA who presented to the ED for evaluation of progressive SOB, palpitations, chest pain for the past couple of days, and unintentional weight loss for months. In the ED, VS with HR in the 160s, otherwise fairly stable. Labs showed troponin 408 > 398, BNP 980.3, calcium  11.2. SARS-CoV-2, influenza, RSV PCR negative.  UA negative for any infection. Portable chest x-ray negative for focal consolidation, edema, effusion. CTA chest negative for PE.  Cardiology consulted.  [facility] hospitalist admitted patient for further management.  Patient admitted as above with paroxysmal A-fib with RVR now converted to sinus rhythm, notable history of sick sinus syndrome status post pacemaker also.  At this time given stable nature will continue on medications as below.  Chronic comorbid conditions including heart failure CAD hypercalcemia CKD hypertension hyperlipidemia sleep apnea all appear to be well-controlled.  Patient otherwise stable and agreeable for discharge home, family arranging for caretaker given patient's needs.   Discharge Diagnoses:  Principal Problem:   Paroxysmal atrial fibrillation with rapid ventricular response (HCC) Active Problems:   CAD (coronary artery disease/elevated  troponin   Essential hypertension   Chronic kidney disease, stage 3b (HCC)   Hyperlipidemia   OSA (obstructive sleep apnea)   Chronic heart failure with preserved ejection fraction (HFpEF) (HCC)   Hypercalcemia    Discharge Instructions  Discharge Instructions     Call MD for:  difficulty breathing, headache or visual disturbances   Complete by: As directed    Call MD for:  extreme fatigue   Complete by: As directed    Call MD for:  hives   Complete by: As directed    Call MD for:  persistant dizziness or light-headedness   Complete by: As directed    Call MD for:  persistant nausea and vomiting   Complete by: As directed    Call MD for:  severe uncontrolled pain   Complete by: As directed    Call MD for:  temperature >100.4   Complete by: As directed    Diet - low sodium heart healthy   Complete by: As directed    Increase activity slowly   Complete by: As directed       Allergies as of 06/28/2024       Reactions   Lidocaine  Anaphylaxis   Morphine  Other (See Comments)   Body shuts down   Procaine Hcl Anaphylaxis, Rash, Other (See Comments)   Anything with 'caine' in it    Sulfonamide Derivatives Hives   Amlodipine  Swelling   Ezetimibe-simvastatin Other (See Comments)   VYTORIN = Joint pain   Latex Itching, Other (See Comments)   WELTS   Norco [hydrocodone -acetaminophen ] Nausea And Vomiting, Other (See Comments)   States does ok with IV form    Tape Itching, Other (See Comments)   Patient prefers either paper tape or Coban wrap   Tramadol  Nausea And Vomiting  Medication List     STOP taking these medications    hydrALAZINE  100 MG tablet Commonly known as: APRESOLINE    methocarbamol  500 MG tablet Commonly known as: ROBAXIN    metoprolol  succinate 25 MG 24 hr tablet Commonly known as: TOPROL -XL       TAKE these medications    acetaminophen  325 MG tablet Commonly known as: TYLENOL  Take 2 tablets (650 mg total) by mouth every 6 (six)  hours as needed for mild pain (pain score 1-3) or fever (or Fever >/= 101).   alum & mag hydroxide-simeth 200-200-20 MG/5ML suspension Commonly known as: MAALOX/MYLANTA Take 30 mLs by mouth every 4 (four) hours as needed for indigestion.   amiodarone  200 MG tablet Commonly known as: PACERONE  Take 1 tablet (200 mg total) by mouth 2 (two) times daily.   apixaban  2.5 MG Tabs tablet Commonly known as: Eliquis  Take 1 tablet (2.5 mg total) by mouth 2 (two) times daily.   BOOST/FIBER PO Take 1 Bottle by mouth 3 (three) times daily.   CertaVite/Antioxidants Tabs Take 1 tablet by mouth daily.   cyanocobalamin  500 MCG tablet Commonly known as: VITAMIN B12 Take 500 mcg by mouth daily.   diclofenac  Sodium 1 % Gel Commonly known as: VOLTAREN  Apply 2 g topically 4 (four) times daily for 7 days, THEN 2 g 4 (four) times daily as needed. Apply to right lower ribs. Start taking on: May 24, 2024 What changed: See the new instructions.   empagliflozin  10 MG Tabs tablet Commonly known as: Jardiance  Take 1 tablet (10 mg total) by mouth daily.   furosemide  20 MG tablet Commonly known as: LASIX  Take 10 mg by mouth daily as needed (for swelling of the ankles).   ipratropium 0.06 % nasal spray Commonly known as: ATROVENT  Place 2 sprays into both nostrils 2 (two) times daily as needed (allergies).   isosorbide  mononitrate 30 MG 24 hr tablet Commonly known as: IMDUR  Take 1 tablet (30 mg total) by mouth daily.   ketoconazole  2 % cream Commonly known as: NIZORAL  Apply 1 Application topically 2 (two) times daily as needed for irritation.   loratadine  10 MG tablet Commonly known as: CLARITIN  Take 10 mg by mouth daily as needed for allergies.   megestrol  40 MG tablet Commonly known as: MEGACE  Take 1 tablet (40 mg total) by mouth in the morning.   metoprolol  tartrate 25 MG tablet Commonly known as: LOPRESSOR  Take 1 tablet (25 mg total) by mouth 2 (two) times daily.   mirtazapine  15 MG  tablet Commonly known as: REMERON  Take 1 tablet (15 mg total) by mouth at bedtime.   nitroGLYCERIN  0.4 MG SL tablet Commonly known as: NITROSTAT  Place 1 tablet (0.4 mg total) under the tongue every 5 (five) minutes as needed for chest pain.   pantoprazole  40 MG tablet Commonly known as: PROTONIX  Take 1 tablet (40 mg total) by mouth daily.   polyethylene glycol 17 g packet Commonly known as: MIRALAX  / GLYCOLAX  Take 17 g by mouth 2 (two) times daily.   senna-docusate 8.6-50 MG tablet Commonly known as: Senokot-S Take 1 tablet by mouth 2 (two) times daily.   sucralfate  1 GM/10ML suspension Commonly known as: CARAFATE  Take 10 mLs (1 g total) by mouth 2 (two) times daily.   triamcinolone  55 MCG/ACT nasal inhaler Commonly known as: NASACORT Place 1 spray into both nostrils daily as needed (for allergies).        Follow-up Information     Johnny Garnette LABOR, MD Follow up on 06/28/2024.  Specialty: Family Medicine Why: 2:30 for hospital follow up, Contact information: [address] [place] [facility] [place] [address] [phone]         [facility] Follow up.   Why: Registered Nurse, Aide, Physical Therapy, Occupational Therapy-office to call the son with visit times. Contact information: [address] [place] [address] [phone]                Allergies  Allergen Reactions   Lidocaine  Anaphylaxis   Morphine  Other (See Comments)    Body shuts down   Procaine Hcl Anaphylaxis, Rash and Other (See Comments)    Anything with 'caine' in it    Sulfonamide Derivatives Hives   Amlodipine  Swelling   Ezetimibe-Simvastatin Other (See Comments)    VYTORIN = Joint pain   Latex Itching and Other (See Comments)    WELTS   Norco [Hydrocodone -Acetaminophen ] Nausea And Vomiting and Other (See Comments)    States does ok with IV form    Tape Itching and Other (See Comments)    Patient prefers either paper tape or Coban wrap   Tramadol   Nausea And Vomiting    Consultations: Cardiology   Procedures/Studies: CT Angio Chest PE W and/or Wo Contrast Result Date: 06/20/2024 CLINICAL DATA:  Baseline short of breath. Increased shortness of breath today with leg swelling. EXAM: CT ANGIOGRAPHY CHEST WITH CONTRAST TECHNIQUE: Multidetector CT imaging of the chest was performed using the standard protocol during bolus administration of intravenous contrast. Multiplanar CT image reconstructions and MIPs were obtained to evaluate the vascular anatomy. RADIATION DOSE REDUCTION: This exam was performed according to the departmental dose-optimization program which includes automated exposure control, adjustment of the mA and/or kV according to patient size and/or use of iterative reconstruction technique. CONTRAST:  70mL OMNIPAQUE  IOHEXOL  350 MG/ML SOLN COMPARISON:  Same day chest radiograph and CT chest 05/22/2024 FINDINGS: Cardiovascular: Negative for pulmonary embolism. Normal caliber thoracic aorta. Coronary artery and aortic atherosclerotic calcification. No pericardial effusion. Left chest wall pacemaker. Mediastinum/Nodes: Trachea and esophagus are unremarkable. No pathologic adenopathy. Lungs/Pleura: Biapical pleuroparenchymal scarring and atelectasis in the lower lobes. Trace right pleural effusion. No pneumothorax. Upper Abdomen: No acute abnormality. Musculoskeletal: No acute fracture.  Right reverse TSA. Review of the MIP images confirms the above findings. IMPRESSION: 1. Negative for pulmonary embolism. 2. Trace right pleural effusion, unchanged from 05/22/2024. 3. Aortic Atherosclerosis (ICD10-I70.0). Electronically Signed   By: Norman Gatlin M.D.   On: 06/20/2024 21:26   DG Chest Portable 1 View Result Date: 06/20/2024 CLINICAL DATA:  Short of breath EXAM: PORTABLE CHEST 1 VIEW COMPARISON:  None Available. FINDINGS: LEFT-sided pacer overlies normal cardiac silhouette. Chronic elevation LEFT hemidiaphragm. No effusion, infiltrate,  pneumothorax. RIGHT shoulder arthroplasty IMPRESSION: No acute cardiopulmonary process. Electronically Signed   By: Jackquline Boxer M.D.   On: 06/20/2024 16:27     Subjective: No acute issues or events overnight.   Discharge Exam: Vitals:   06/28/24 0800 06/28/24 1233  BP: (!) 166/72 (!) 132/56  Pulse: 60 60  Resp: 15 16  Temp: 98.5 F (36.9 C) 98.2 F (36.8 C)  SpO2: 92% 97%   Vitals:   06/27/24 2337 06/28/24 0420 06/28/24 0800 06/28/24 1233  BP: (!) 143/57 135/64 (!) 166/72 (!) 132/56  Pulse:  60 60 60  Resp: 18 17 15 16   Temp: 98 F (36.7 C) 98.3 F (36.8 C) 98.5 F (36.9 C) 98.2 F (36.8 C)  TempSrc: Oral Oral Oral Oral  SpO2: 96% 94% 92%  97%  Weight:      Height:        General: Pt is alert, awake, not in acute distress Cardiovascular: RRR, S1/S2 +, no rubs, no gallops Respiratory: CTA bilaterally, no wheezing, no rhonchi Abdominal: Soft, NT, ND, bowel sounds + Extremities: no edema, no cyanosis    The results of significant diagnostics from this hospitalization (including imaging, microbiology, ancillary and laboratory) are listed below for reference.     Microbiology: Recent Results (from the past 240 hours)  Resp panel by RT-PCR (RSV, Flu A&B, Covid) Anterior Nasal Swab     Status: None   Collection Time: 06/20/24  4:12 PM   Specimen: Anterior Nasal Swab  Result Value Ref Range Status   SARS Coronavirus 2 by RT PCR NEGATIVE NEGATIVE Final    Comment: (NOTE) SARS-CoV-2 target nucleic acids are NOT DETECTED.  The SARS-CoV-2 RNA is generally detectable in upper respiratory specimens during the acute phase of infection. The lowest concentration of SARS-CoV-2 viral copies this assay can detect is 138 copies/mL. A negative result does not preclude SARS-Cov-2 infection and should not be used as the sole basis for treatment or other patient management decisions. A negative result may occur with  improper specimen collection/handling, submission of  specimen other than nasopharyngeal swab, presence of viral mutation(s) within the areas targeted by this assay, and inadequate number of viral copies(<138 copies/mL). A negative result must be combined with clinical observations, patient history, and epidemiological information. The expected result is Negative.  Fact Sheet for Patients:  BloggerCourse.com  Fact Sheet for Healthcare Providers:  SeriousBroker.it  This test is no t yet approved or cleared by the [place]  FDA and  has been authorized for detection and/or diagnosis of SARS-CoV-2 by FDA under an Emergency Use Authorization (EUA). This EUA will remain  in effect (meaning this test can be used) for the duration of the COVID-19 declaration under Section 564(b)(1) of the Act, 21 U.S.C.section 360bbb-3(b)(1), unless the authorization is terminated  or revoked sooner.       Influenza A by PCR NEGATIVE NEGATIVE Final   Influenza B by PCR NEGATIVE NEGATIVE Final    Comment: (NOTE) The Xpert Xpress SARS-CoV-2/FLU/RSV plus assay is intended as an aid in the diagnosis of influenza from Nasopharyngeal swab specimens and should not be used as a sole basis for treatment. Nasal washings and aspirates are unacceptable for Xpert Xpress SARS-CoV-2/FLU/RSV testing.  Fact Sheet for Patients: BloggerCourse.com  Fact Sheet for Healthcare Providers: SeriousBroker.it  This test is not yet approved or cleared by the [place]  FDA and has been authorized for detection and/or diagnosis of SARS-CoV-2 by FDA under an Emergency Use Authorization (EUA). This EUA will remain in effect (meaning this test can be used) for the duration of the COVID-19 declaration under Section 564(b)(1) of the Act, 21 U.S.C. section 360bbb-3(b)(1), unless the authorization is terminated or revoked.     Resp Syncytial Virus by PCR NEGATIVE NEGATIVE Final     Comment: (NOTE) Fact Sheet for Patients: BloggerCourse.com  Fact Sheet for Healthcare Providers: SeriousBroker.it  This test is not yet approved or cleared by the [place]  FDA and has been authorized for detection and/or diagnosis of SARS-CoV-2 by FDA under an Emergency Use Authorization (EUA). This EUA will remain in effect (meaning this test can be used) for the duration of the COVID-19 declaration under Section 564(b)(1) of the Act, 21 U.S.C. section 360bbb-3(b)(1), unless the authorization is terminated or revoked.  Performed at [facility], [address] [place], [place] [address]      Labs: BNP (last 3 results) Recent Labs    05/18/24 1132 06/20/24 1538  BNP 852.3* 980.3*   Basic Metabolic Panel: Recent Labs  Lab 06/22/24 0529  NA 134*  K 3.5  CL 108  CO2 22  GLUCOSE 88  BUN 27*  CREATININE 1.29*  CALCIUM  10.1   Liver Function Tests: No results for input(s): AST, ALT, ALKPHOS, BILITOT, PROT, ALBUMIN in the last 168 hours.  No results for input(s): LIPASE, AMYLASE in the last 168 hours. No results for input(s): AMMONIA in the last 168 hours. CBC: Recent Labs  Lab 06/22/24 0529  WBC 3.2*  HGB 9.9*  HCT 30.5*  MCV 88.7  PLT 176   Urinalysis    Component Value Date/Time   COLORURINE YELLOW 06/20/2024 2116   APPEARANCEUR HAZY (A) 06/20/2024 2116   LABSPEC 1.020 06/20/2024 2116   PHURINE 5.0 06/20/2024 2116   GLUCOSEU >=500 (A) 06/20/2024 2116   GLUCOSEU NEGATIVE 09/13/2021 1203   HGBUR NEGATIVE 06/20/2024 2116   HGBUR negative 10/17/2010 0859   BILIRUBINUR NEGATIVE 06/20/2024 2116   BILIRUBINUR neg 08/01/2021 1037   KETONESUR NEGATIVE 06/20/2024 2116   PROTEINUR NEGATIVE 06/20/2024 2116   UROBILINOGEN 0.2 09/13/2021 1203   NITRITE NEGATIVE 06/20/2024 2116   LEUKOCYTESUR NEGATIVE 06/20/2024 2116   Sepsis Labs Recent Labs  Lab 06/22/24 0529   WBC 3.2*   Microbiology Recent Results (from the past 240 hours)  Resp panel by RT-PCR (RSV, Flu A&B, Covid) Anterior Nasal Swab     Status: None   Collection Time: 06/20/24  4:12 PM   Specimen: Anterior Nasal Swab  Result Value Ref Range Status   SARS Coronavirus 2 by RT PCR NEGATIVE NEGATIVE Final    Comment: (NOTE) SARS-CoV-2 target nucleic acids are NOT DETECTED.  The SARS-CoV-2 RNA is generally detectable in upper respiratory specimens during the acute phase of infection. The lowest concentration of SARS-CoV-2 viral copies this assay can detect is 138 copies/mL. A negative result does not preclude SARS-Cov-2 infection and should not be used as the sole basis for treatment or other patient management decisions. A negative result may occur with  improper specimen collection/handling, submission of specimen other than nasopharyngeal swab, presence of viral mutation(s) within the areas targeted by this assay, and inadequate number of viral copies(<138 copies/mL). A negative result must be combined with clinical observations, patient history, and epidemiological information. The expected result is Negative.  Fact Sheet for Patients:  BloggerCourse.com  Fact Sheet for Healthcare Providers:  SeriousBroker.it  This test is no t yet approved or cleared by the [place]  FDA and  has been authorized for detection and/or diagnosis of SARS-CoV-2 by FDA under an Emergency Use Authorization (EUA). This EUA will remain  in effect (meaning this test can be used) for the duration of the COVID-19 declaration under Section 564(b)(1) of the Act, 21 U.S.C.section 360bbb-3(b)(1), unless the authorization is terminated  or revoked sooner.       Influenza A by PCR NEGATIVE NEGATIVE Final   Influenza B by PCR NEGATIVE NEGATIVE Final    Comment: (NOTE) The Xpert Xpress SARS-CoV-2/FLU/RSV plus assay is intended as an aid in the diagnosis of  influenza from Nasopharyngeal swab specimens and should not be used as a sole basis for treatment. Nasal washings and aspirates are unacceptable for Xpert Xpress SARS-CoV-2/FLU/RSV testing.  Fact Sheet for Patients: BloggerCourse.com  Fact Sheet for Healthcare Providers: SeriousBroker.it  This test is not yet approved  or cleared by the [place]  FDA and has been authorized for detection and/or diagnosis of SARS-CoV-2 by FDA under an Emergency Use Authorization (EUA). This EUA will remain in effect (meaning this test can be used) for the duration of the COVID-19 declaration under Section 564(b)(1) of the Act, 21 U.S.C. section 360bbb-3(b)(1), unless the authorization is terminated or revoked.     Resp Syncytial Virus by PCR NEGATIVE NEGATIVE Final    Comment: (NOTE) Fact Sheet for Patients: BloggerCourse.com  Fact Sheet for Healthcare Providers: SeriousBroker.it  This test is not yet approved or cleared by the [place]  FDA and has been authorized for detection and/or diagnosis of SARS-CoV-2 by FDA under an Emergency Use Authorization (EUA). This EUA will remain in effect (meaning this test can be used) for the duration of the COVID-19 declaration under Section 564(b)(1) of the Act, 21 U.S.C. section 360bbb-3(b)(1), unless the authorization is terminated or revoked.  Performed at [facility], [address], [place], [place] [address]      Time coordinating discharge: Over 30 minutes  SIGNED:   Darcel Dawley, DO [facility] 06/28/2024, 2:12 PM Pager   If 7PM-7AM, please contact night-coverage [email or web address]

## 2024-06-27 NOTE — Care Management Important Message (Signed)
 Important Message  Patient Details  Name: Barbara [name] MRN: [id number] Date of Birth: 1941-02-08   Important Message Given:  Yes - Medicare IM     Marval Gell, RN 06/27/2024, 7:32 AM

## 2024-06-27 NOTE — Plan of Care (Signed)

## 2024-06-28 ENCOUNTER — Other Ambulatory Visit (HOSPITAL_COMMUNITY): Payer: Self-pay

## 2024-06-28 ENCOUNTER — Inpatient Hospital Stay: Admitting: Family Medicine

## 2024-06-28 DIAGNOSIS — I480 Paroxysmal atrial fibrillation: Secondary | ICD-10-CM | POA: Diagnosis not present

## 2024-06-28 NOTE — TOC Transition Note (Signed)
 Transition of Care [facility]) - Discharge Note   Patient Details  Name: Barbara [name] MRN: [id number] Date of Birth: 1941-02-26  Transition of Care [facility]) CM/SW Contact:  Sudie Erminio Deems, RN Phone Number: 06/28/2024, 2:13 PM   Clinical Narrative: [facility] Appeal still pending. Son has stated that patient will have supportive care in the home and is agreeable to discharge today. Case Manager has called [facility] and they will service RN/PT/OT/Aide/CSW care. [facility] Liaison is aware that HTA will cover 35 hours post hospital and this includes aide services. Amy to expedite the aide services. Start of care to begin within 24-48 hours post transition home. HTA covers custodial care for 20 hours and the family will utilize [facility]. Son to call [facility] and arrange caregiver support. Son is already paying a person out of pocket for additional services. Son is agreeable to dc today via PTAR. Staff RN is aware to call the son once PTAR arrives to make sure family is at the home at the time of arrival. No further needs identified at this time.        Final next level of care: Home w Home Health Services Barriers to Discharge: No Barriers Identified   HH Arranged: RN, Disease Management, PT, OT, Nurse's Aide, Social Work [employer] Agency: [facility] Date [facility] Agency Contacted: 06/28/24 Time HH Agency Contacted: 1400 Representative spoke with at [facility] Agency: Amy  Social Drivers of Health (SDOH) Interventions SDOH Screenings   Food Insecurity: No Food Insecurity (06/20/2024)  Housing: High Risk (06/20/2024)  Transportation Needs: Unmet Transportation Needs (06/20/2024)  Utilities: Not At Risk (06/20/2024)  Alcohol Screen: Low Risk  (08/05/2022)  Depression (PHQ2-9): Low Risk  (04/05/2024)  Financial Resource Strain: Low Risk  (08/05/2022)  Physical Activity: Insufficiently Active (07/02/2022)  Social Connections: Moderately Integrated (06/20/2024)  Stress: No Stress  Concern Present (07/02/2022)  Tobacco Use: Medium Risk (06/20/2024)     Readmission Risk Interventions    05/24/2024    8:57 AM 03/11/2024    1:07 PM  Readmission Risk Prevention Plan  Transportation Screening Complete Complete  PCP or Specialist Appt within 5-7 Days  Complete  Home Care Screening  Complete  Medication Review (RN CM)  Complete  HRI or Home Care Consult Complete   Social Work Consult for Recovery Care Planning/Counseling Complete   Palliative Care Screening Not Applicable   Medication Review Ocean[employer]) Complete

## 2024-06-28 NOTE — Telephone Encounter (Signed)
 [facility] care is taking care of pt referral for Custodial care, pt son is aware of this and should be getting updates from [facility], [facility]

## 2024-06-28 NOTE — Telephone Encounter (Signed)
 Rosaline Brittle, CMA called in asking for an update on clearance. She states she wasn't aware that Dr. Carlie put he was okay with it. Informed there is a note in care everywhere 8/15 saying he is okay with. She states she will go back and ask Dr. Carlie about that and Eliquis  and asked that we call back.

## 2024-06-28 NOTE — Telephone Encounter (Signed)
 Pt letter faxed to Bruce with [facility] care, confirmed with [facility] that they received pt fax. Notified pt son of the updates pt son voiced understanding. Advised pt that he would be getting a call from Bruce with [facility] care with the next step.

## 2024-06-28 NOTE — Discharge Summary (Signed)
 Physician Discharge Summary  Barbara [name] FMW:[id number] DOB: 05/15/1941 DOA: 06/20/2024  PCP: Johnny Garnette LABOR, MD  Admit date: 06/20/2024  Discharge date: 06/28/2024  Admitted From: Home.  Disposition:  Home Health Services.  Recommendations for Outpatient Follow-up:  Follow up with PCP in 1-2 weeks Please obtain BMP/CBC in one week Follow-up with cardiology as scheduled.  Home Health; PT/OT/nursing/aide/social worker Equipment/Devices: None  Discharge Condition: Stable CODE STATUS:Full code Diet recommendation: Heart Healthy  Brief Summary/ Hospital Course: Barbara [name] is a 83 y.o. female with medical history significant for CAD (s/p PCI LAD 2000), PAF on Eliquis , chronic HFpEF, apical HCM, SSS s/p PPM, CKD stage IIIb, chronic hypercalcemia, HTN, HLD, chronic anemia, OSA who presented to the ED for evaluation of progressive SOB, palpitations, chest pain for the past couple of days, and unintentional weight loss for months. In the ED, VS with HR in the 160s, otherwise fairly stable. Labs showed troponin 408 > 398, BNP 980.3, calcium  11.2. SARS-CoV-2, influenza, RSV PCR negative.  UA negative for any infection. Portable chest x-ray negative for focal consolidation, edema, effusion. CTA chest negative for PE.  Cardiology consulted.  [facility] hospitalist admitted patient for further management.   Patient admitted as above with paroxysmal A-fib with RVR,  now converted to sinus rhythm, notable history of sick sinus syndrome status post pacemaker also.  At this time given stable nature will continue on medications as below.  Chronic comorbid conditions including heart failure CAD hypercalcemia CKD hypertension hyperlipidemia sleep apnea all appear to be well-controlled.  Patient otherwise stable and agreeable for discharge home, family arranging for caretaker given patient's needs.  Discharge Diagnoses:  Principal Problem:   Paroxysmal atrial fibrillation with rapid  ventricular response (HCC) Active Problems:   CAD (coronary artery disease/elevated troponin   Essential hypertension   Chronic kidney disease, stage 3b (HCC)   Hyperlipidemia   OSA (obstructive sleep apnea)   Chronic heart failure with preserved ejection fraction (HFpEF) (HCC)   Hypercalcemia    Discharge Instructions  Discharge Instructions     Call MD for:  difficulty breathing, headache or visual disturbances   Complete by: As directed    Call MD for:  extreme fatigue   Complete by: As directed    Call MD for:  hives   Complete by: As directed    Call MD for:  persistant dizziness or light-headedness   Complete by: As directed    Call MD for:  persistant nausea and vomiting   Complete by: As directed    Call MD for:  severe uncontrolled pain   Complete by: As directed    Call MD for:  temperature >100.4   Complete by: As directed    Diet - low sodium heart healthy   Complete by: As directed    Increase activity slowly   Complete by: As directed       Allergies as of 06/28/2024       Reactions   Lidocaine  Anaphylaxis   Morphine  Other (See Comments)   Body shuts down   Procaine Hcl Anaphylaxis, Rash, Other (See Comments)   Anything with 'caine' in it    Sulfonamide Derivatives Hives   Amlodipine  Swelling   Ezetimibe-simvastatin Other (See Comments)   VYTORIN = Joint pain   Latex Itching, Other (See Comments)   WELTS   Norco [hydrocodone -acetaminophen ] Nausea And Vomiting, Other (See Comments)   States does ok with IV form    Tape Itching, Other (See Comments)   Patient prefers either  paper tape or Coban wrap   Tramadol  Nausea And Vomiting        Medication List     STOP taking these medications    hydrALAZINE  100 MG tablet Commonly known as: APRESOLINE    methocarbamol  500 MG tablet Commonly known as: ROBAXIN    metoprolol  succinate 25 MG 24 hr tablet Commonly known as: TOPROL -XL       TAKE these medications    acetaminophen  325 MG  tablet Commonly known as: TYLENOL  Take 2 tablets (650 mg total) by mouth every 6 (six) hours as needed for mild pain (pain score 1-3) or fever (or Fever >/= 101).   alum & mag hydroxide-simeth 200-200-20 MG/5ML suspension Commonly known as: MAALOX/MYLANTA Take 30 mLs by mouth every 4 (four) hours as needed for indigestion.   amiodarone  200 MG tablet Commonly known as: PACERONE  Take 1 tablet (200 mg total) by mouth 2 (two) times daily.   apixaban  2.5 MG Tabs tablet Commonly known as: Eliquis  Take 1 tablet (2.5 mg total) by mouth 2 (two) times daily.   BOOST/FIBER PO Take 1 Bottle by mouth 3 (three) times daily.   CertaVite/Antioxidants Tabs Take 1 tablet by mouth daily.   cyanocobalamin  500 MCG tablet Commonly known as: VITAMIN B12 Take 500 mcg by mouth daily.   diclofenac  Sodium 1 % Gel Commonly known as: VOLTAREN  Apply 2 g topically 4 (four) times daily for 7 days, THEN 2 g 4 (four) times daily as needed. Apply to right lower ribs. Start taking on: May 24, 2024 What changed: See the new instructions.   empagliflozin  10 MG Tabs tablet Commonly known as: Jardiance  Take 1 tablet (10 mg total) by mouth daily.   furosemide  20 MG tablet Commonly known as: LASIX  Take 10 mg by mouth daily as needed (for swelling of the ankles).   ipratropium 0.06 % nasal spray Commonly known as: ATROVENT  Place 2 sprays into both nostrils 2 (two) times daily as needed (allergies).   isosorbide  mononitrate 30 MG 24 hr tablet Commonly known as: IMDUR  Take 1 tablet (30 mg total) by mouth daily.   ketoconazole  2 % cream Commonly known as: NIZORAL  Apply 1 Application topically 2 (two) times daily as needed for irritation.   loratadine  10 MG tablet Commonly known as: CLARITIN  Take 10 mg by mouth daily as needed for allergies.   megestrol  40 MG tablet Commonly known as: MEGACE  Take 1 tablet (40 mg total) by mouth in the morning.   metoprolol  tartrate 25 MG tablet Commonly known as:  LOPRESSOR  Take 1 tablet (25 mg total) by mouth 2 (two) times daily.   mirtazapine  15 MG tablet Commonly known as: REMERON  Take 1 tablet (15 mg total) by mouth at bedtime.   nitroGLYCERIN  0.4 MG SL tablet Commonly known as: NITROSTAT  Place 1 tablet (0.4 mg total) under the tongue every 5 (five) minutes as needed for chest pain.   pantoprazole  40 MG tablet Commonly known as: PROTONIX  Take 1 tablet (40 mg total) by mouth daily.   polyethylene glycol 17 g packet Commonly known as: MIRALAX  / GLYCOLAX  Take 17 g by mouth 2 (two) times daily.   senna-docusate 8.6-50 MG tablet Commonly known as: Senokot-S Take 1 tablet by mouth 2 (two) times daily.   sucralfate  1 GM/10ML suspension Commonly known as: CARAFATE  Take 10 mLs (1 g total) by mouth 2 (two) times daily.   triamcinolone  55 MCG/ACT nasal inhaler Commonly known as: NASACORT Place 1 spray into both nostrils daily as needed (for allergies).  Follow-up Information     Johnny Garnette LABOR, MD Follow up on 06/28/2024.   Specialty: Family Medicine Why: 2:30 for hospital follow up, Contact information: [address] [place] [facility] [place] [address] [phone]         [facility] Follow up.   Why: Registered Nurse, Aide, Physical Therapy, Occupational Therapy-office to call the son with visit times. Contact information: [address] [place] [address] [phone]                Allergies  Allergen Reactions   Lidocaine  Anaphylaxis   Morphine  Other (See Comments)    Body shuts down   Procaine Hcl Anaphylaxis, Rash and Other (See Comments)    Anything with 'caine' in it    Sulfonamide Derivatives Hives   Amlodipine  Swelling   Ezetimibe-Simvastatin Other (See Comments)    VYTORIN = Joint pain   Latex Itching and Other (See Comments)    WELTS   Norco [Hydrocodone -Acetaminophen ] Nausea And Vomiting and Other (See Comments)    States does ok with IV form    Tape Itching  and Other (See Comments)    Patient prefers either paper tape or Coban wrap   Tramadol  Nausea And Vomiting    Consultations:   Procedures/Studies: CT Angio Chest PE W and/or Wo Contrast Result Date: 06/20/2024 CLINICAL DATA:  Baseline short of breath. Increased shortness of breath today with leg swelling. EXAM: CT ANGIOGRAPHY CHEST WITH CONTRAST TECHNIQUE: Multidetector CT imaging of the chest was performed using the standard protocol during bolus administration of intravenous contrast. Multiplanar CT image reconstructions and MIPs were obtained to evaluate the vascular anatomy. RADIATION DOSE REDUCTION: This exam was performed according to the departmental dose-optimization program which includes automated exposure control, adjustment of the mA and/or kV according to patient size and/or use of iterative reconstruction technique. CONTRAST:  70mL OMNIPAQUE  IOHEXOL  350 MG/ML SOLN COMPARISON:  Same day chest radiograph and CT chest 05/22/2024 FINDINGS: Cardiovascular: Negative for pulmonary embolism. Normal caliber thoracic aorta. Coronary artery and aortic atherosclerotic calcification. No pericardial effusion. Left chest wall pacemaker. Mediastinum/Nodes: Trachea and esophagus are unremarkable. No pathologic adenopathy. Lungs/Pleura: Biapical pleuroparenchymal scarring and atelectasis in the lower lobes. Trace right pleural effusion. No pneumothorax. Upper Abdomen: No acute abnormality. Musculoskeletal: No acute fracture.  Right reverse TSA. Review of the MIP images confirms the above findings. IMPRESSION: 1. Negative for pulmonary embolism. 2. Trace right pleural effusion, unchanged from 05/22/2024. 3. Aortic Atherosclerosis (ICD10-I70.0). Electronically Signed   By: Norman Gatlin M.D.   On: 06/20/2024 21:26   DG Chest Portable 1 View Result Date: 06/20/2024 CLINICAL DATA:  Short of breath EXAM: PORTABLE CHEST 1 VIEW COMPARISON:  None Available. FINDINGS: LEFT-sided pacer overlies normal cardiac  silhouette. Chronic elevation LEFT hemidiaphragm. No effusion, infiltrate, pneumothorax. RIGHT shoulder arthroplasty IMPRESSION: No acute cardiopulmonary process. Electronically Signed   By: Jackquline Boxer M.D.   On: 06/20/2024 16:27    Subjective: Patient was seen and examined at bedside.  Overnight events noted. Patient was sitting comfortably in the recliner,  patient remains in normal sinus rhythm.   Denies any other concerns. She wants to be discharged home.  Discharge Exam: Vitals:   06/28/24 0800 06/28/24 1233  BP: (!) 166/72 (!) 132/56  Pulse: 60 60  Resp: 15 16  Temp: 98.5 F (36.9 C) 98.2 F (36.8 C)  SpO2: 92% 97%   Vitals:   06/27/24 2337 06/28/24 0420 06/28/24 0800 06/28/24 1233  BP: (!) 143/57 135/64 ROLLEN)  166/72 (!) 132/56  Pulse:  60 60 60  Resp: 18 17 15 16   Temp: 98 F (36.7 C) 98.3 F (36.8 C) 98.5 F (36.9 C) 98.2 F (36.8 C)  TempSrc: Oral Oral Oral Oral  SpO2: 96% 94% 92% 97%  Weight:      Height:        General: Pt is alert, awake, not in acute distress Cardiovascular: RRR, S1/S2 +, no rubs, no gallops Respiratory: CTA bilaterally, no wheezing, no rhonchi Abdominal: Soft, NT, ND, bowel sounds + Extremities: no edema, no cyanosis    The results of significant diagnostics from this hospitalization (including imaging, microbiology, ancillary and laboratory) are listed below for reference.     Microbiology: Recent Results (from the past 240 hours)  Resp panel by RT-PCR (RSV, Flu A&B, Covid) Anterior Nasal Swab     Status: None   Collection Time: 06/20/24  4:12 PM   Specimen: Anterior Nasal Swab  Result Value Ref Range Status   SARS Coronavirus 2 by RT PCR NEGATIVE NEGATIVE Final    Comment: (NOTE) SARS-CoV-2 target nucleic acids are NOT DETECTED.  The SARS-CoV-2 RNA is generally detectable in upper respiratory specimens during the acute phase of infection. The lowest concentration of SARS-CoV-2 viral copies this assay can detect is 138  copies/mL. A negative result does not preclude SARS-Cov-2 infection and should not be used as the sole basis for treatment or other patient management decisions. A negative result may occur with  improper specimen collection/handling, submission of specimen other than nasopharyngeal swab, presence of viral mutation(s) within the areas targeted by this assay, and inadequate number of viral copies(<138 copies/mL). A negative result must be combined with clinical observations, patient history, and epidemiological information. The expected result is Negative.  Fact Sheet for Patients:  BloggerCourse.com  Fact Sheet for Healthcare Providers:  SeriousBroker.it  This test is no t yet approved or cleared by the [place]  FDA and  has been authorized for detection and/or diagnosis of SARS-CoV-2 by FDA under an Emergency Use Authorization (EUA). This EUA will remain  in effect (meaning this test can be used) for the duration of the COVID-19 declaration under Section 564(b)(1) of the Act, 21 U.S.C.section 360bbb-3(b)(1), unless the authorization is terminated  or revoked sooner.       Influenza A by PCR NEGATIVE NEGATIVE Final   Influenza B by PCR NEGATIVE NEGATIVE Final    Comment: (NOTE) The Xpert Xpress SARS-CoV-2/FLU/RSV plus assay is intended as an aid in the diagnosis of influenza from Nasopharyngeal swab specimens and should not be used as a sole basis for treatment. Nasal washings and aspirates are unacceptable for Xpert Xpress SARS-CoV-2/FLU/RSV testing.  Fact Sheet for Patients: BloggerCourse.com  Fact Sheet for Healthcare Providers: SeriousBroker.it  This test is not yet approved or cleared by the [place]  FDA and has been authorized for detection and/or diagnosis of SARS-CoV-2 by FDA under an Emergency Use Authorization (EUA). This EUA will remain in effect (meaning  this test can be used) for the duration of the COVID-19 declaration under Section 564(b)(1) of the Act, 21 U.S.C. section 360bbb-3(b)(1), unless the authorization is terminated or revoked.     Resp Syncytial Virus by PCR NEGATIVE NEGATIVE Final    Comment: (NOTE) Fact Sheet for Patients: BloggerCourse.com  Fact Sheet for Healthcare Providers: SeriousBroker.it  This test is not yet approved or cleared by the [place]  FDA and has been authorized for detection and/or diagnosis of SARS-CoV-2 by FDA under an Emergency Use Authorization (EUA).  This EUA will remain in effect (meaning this test can be used) for the duration of the COVID-19 declaration under Section 564(b)(1) of the Act, 21 U.S.C. section 360bbb-3(b)(1), unless the authorization is terminated or revoked.  Performed at [facility], [address], [place], [place] [address]      Labs: BNP (last 3 results) Recent Labs    05/18/24 1132 06/20/24 1538  BNP 852.3* 980.3*   Basic Metabolic Panel: Recent Labs  Lab 06/22/24 0529  NA 134*  K 3.5  CL 108  CO2 22  GLUCOSE 88  BUN 27*  CREATININE 1.29*  CALCIUM  10.1   Liver Function Tests: No results for input(s): AST, ALT, ALKPHOS, BILITOT, PROT, ALBUMIN in the last 168 hours. No results for input(s): LIPASE, AMYLASE in the last 168 hours. No results for input(s): AMMONIA in the last 168 hours. CBC: Recent Labs  Lab 06/22/24 0529  WBC 3.2*  HGB 9.9*  HCT 30.5*  MCV 88.7  PLT 176   Cardiac Enzymes: No results for input(s): CKTOTAL, CKMB, CKMBINDEX, TROPONINI in the last 168 hours. BNP: Invalid input(s): POCBNP CBG: No results for input(s): GLUCAP in the last 168 hours. D-Dimer No results for input(s): DDIMER in the last 72 hours. Hgb A1c No results for input(s): HGBA1C in the last 72 hours. Lipid Profile No results for input(s): CHOL, HDL,  LDLCALC, TRIG, CHOLHDL, LDLDIRECT in the last 72 hours. Thyroid  function studies No results for input(s): TSH, T4TOTAL, T3FREE, THYROIDAB in the last 72 hours.  Invalid input(s): FREET3 Anemia work up No results for input(s): VITAMINB12, FOLATE, FERRITIN, TIBC, IRON , RETICCTPCT in the last 72 hours. Urinalysis    Component Value Date/Time   COLORURINE YELLOW 06/20/2024 2116   APPEARANCEUR HAZY (A) 06/20/2024 2116   LABSPEC 1.020 06/20/2024 2116   PHURINE 5.0 06/20/2024 2116   GLUCOSEU >=500 (A) 06/20/2024 2116   GLUCOSEU NEGATIVE 09/13/2021 1203   HGBUR NEGATIVE 06/20/2024 2116   HGBUR negative 10/17/2010 0859   BILIRUBINUR NEGATIVE 06/20/2024 2116   BILIRUBINUR neg 08/01/2021 1037   KETONESUR NEGATIVE 06/20/2024 2116   PROTEINUR NEGATIVE 06/20/2024 2116   UROBILINOGEN 0.2 09/13/2021 1203   NITRITE NEGATIVE 06/20/2024 2116   LEUKOCYTESUR NEGATIVE 06/20/2024 2116   Sepsis Labs Recent Labs  Lab 06/22/24 0529  WBC 3.2*   Microbiology Recent Results (from the past 240 hours)  Resp panel by RT-PCR (RSV, Flu A&B, Covid) Anterior Nasal Swab     Status: None   Collection Time: 06/20/24  4:12 PM   Specimen: Anterior Nasal Swab  Result Value Ref Range Status   SARS Coronavirus 2 by RT PCR NEGATIVE NEGATIVE Final    Comment: (NOTE) SARS-CoV-2 target nucleic acids are NOT DETECTED.  The SARS-CoV-2 RNA is generally detectable in upper respiratory specimens during the acute phase of infection. The lowest concentration of SARS-CoV-2 viral copies this assay can detect is 138 copies/mL. A negative result does not preclude SARS-Cov-2 infection and should not be used as the sole basis for treatment or other patient management decisions. A negative result may occur with  improper specimen collection/handling, submission of specimen other than nasopharyngeal swab, presence of viral mutation(s) within the areas targeted by this assay, and inadequate number of  viral copies(<138 copies/mL). A negative result must be combined with clinical observations, patient history, and epidemiological information. The expected result is Negative.  Fact Sheet for Patients:  BloggerCourse.com  Fact Sheet for Healthcare Providers:  SeriousBroker.it  This test is no t yet approved or cleared  by the [place]  FDA and  has been authorized for detection and/or diagnosis of SARS-CoV-2 by FDA under an Emergency Use Authorization (EUA). This EUA will remain  in effect (meaning this test can be used) for the duration of the COVID-19 declaration under Section 564(b)(1) of the Act, 21 U.S.C.section 360bbb-3(b)(1), unless the authorization is terminated  or revoked sooner.       Influenza A by PCR NEGATIVE NEGATIVE Final   Influenza B by PCR NEGATIVE NEGATIVE Final    Comment: (NOTE) The Xpert Xpress SARS-CoV-2/FLU/RSV plus assay is intended as an aid in the diagnosis of influenza from Nasopharyngeal swab specimens and should not be used as a sole basis for treatment. Nasal washings and aspirates are unacceptable for Xpert Xpress SARS-CoV-2/FLU/RSV testing.  Fact Sheet for Patients: BloggerCourse.com  Fact Sheet for Healthcare Providers: SeriousBroker.it  This test is not yet approved or cleared by the [place]  FDA and has been authorized for detection and/or diagnosis of SARS-CoV-2 by FDA under an Emergency Use Authorization (EUA). This EUA will remain in effect (meaning this test can be used) for the duration of the COVID-19 declaration under Section 564(b)(1) of the Act, 21 U.S.C. section 360bbb-3(b)(1), unless the authorization is terminated or revoked.     Resp Syncytial Virus by PCR NEGATIVE NEGATIVE Final    Comment: (NOTE) Fact Sheet for Patients: BloggerCourse.com  Fact Sheet for Healthcare  Providers: SeriousBroker.it  This test is not yet approved or cleared by the [place]  FDA and has been authorized for detection and/or diagnosis of SARS-CoV-2 by FDA under an Emergency Use Authorization (EUA). This EUA will remain in effect (meaning this test can be used) for the duration of the COVID-19 declaration under Section 564(b)(1) of the Act, 21 U.S.C. section 360bbb-3(b)(1), unless the authorization is terminated or revoked.  Performed at [facility], [address] [place], [place] [address]      Time coordinating discharge: Over 30 minutes  SIGNED:   Darcel Dawley, MD  [facility] 06/28/2024, 2:18 PM Pager   If 7PM-7AM, please contact night-coverage

## 2024-06-29 ENCOUNTER — Telehealth: Payer: Self-pay

## 2024-06-29 NOTE — Telephone Encounter (Signed)
 Primary Cardiologist:Tiffany Raford, MD   Preoperative team, please contact this patient and set up a phone call appointment for further preoperative risk assessment. Please obtain consent and complete medication review. Thank you for your help.  **Recent hospitalization x 2   I confirm that guidance regarding antiplatelet and oral anticoagulation therapy has been completed and, if necessary, noted below.  Per requesting provider, patient will continue Eliquis  through the perioperative period.  I also confirmed the patient resides in the state of [place] . As per [facility] Medical Board telemedicine laws, the patient must reside in the state in which the provider is licensed.   Rosaline EMERSON Bane, NP-C  06/29/2024, 3:15 PM [address] [place], [place] [address] Office [phone] Fax [phone]

## 2024-06-29 NOTE — Telephone Encounter (Signed)
  Patient Consent for Virtual Visit        Barbara [name] has provided verbal consent on 06/29/2024 for a virtual visit (video or telephone).   CONSENT FOR VIRTUAL VISIT FOR:  Barbara [name]  By participating in this virtual visit I agree to the following:  I hereby voluntarily request, consent and authorize [facility] and its employed or contracted physicians, physician assistants, nurse practitioners or other licensed health care professionals (the Practitioner), to provide me with telemedicine health care services (the "Services) as deemed necessary by the treating Practitioner. I acknowledge and consent to receive the Services by the Practitioner via telemedicine. I understand that the telemedicine visit will involve communicating with the Practitioner through live audiovisual communication technology and the disclosure of certain medical information by electronic transmission. I acknowledge that I have been given the opportunity to request an in-person assessment or other available alternative prior to the telemedicine visit and am voluntarily participating in the telemedicine visit.  I understand that I have the right to withhold or withdraw my consent to the use of telemedicine in the course of my care at any time, without affecting my right to future care or treatment, and that the Practitioner or I may terminate the telemedicine visit at any time. I understand that I have the right to inspect all information obtained and/or recorded in the course of the telemedicine visit and may receive copies of available information for a reasonable fee.  I understand that some of the potential risks of receiving the Services via telemedicine include:  Delay or interruption in medical evaluation due to technological equipment failure or disruption; Information transmitted may not be sufficient (e.g. poor resolution of images) to allow for appropriate medical decision making by  the Practitioner; and/or  In rare instances, security protocols could fail, causing a breach of personal health information.  Furthermore, I acknowledge that it is my responsibility to provide information about my medical history, conditions and care that is complete and accurate to the best of my ability. I acknowledge that Practitioner's advice, recommendations, and/or decision may be based on factors not within their control, such as incomplete or inaccurate data provided by me or distortions of diagnostic images or specimens that may result from electronic transmissions. I understand that the practice of medicine is not an exact science and that Practitioner makes no warranties or guarantees regarding treatment outcomes. I acknowledge that a copy of this consent can be made available to me via my patient portal [facility] MyChart), or I can request a printed copy by calling the office of  [facility].    I understand that my insurance will be billed for this visit.   I have read or had this consent read to me. I understand the contents of this consent, which adequately explains the benefits and risks of the Services being provided via telemedicine.  I have been provided ample opportunity to ask questions regarding this consent and the Services and have had my questions answered to my satisfaction. I give my informed consent for the services to be provided through the use of telemedicine in my medical care

## 2024-06-29 NOTE — Telephone Encounter (Signed)
 Call patient to schedule. Patient will call back, because she stated that she just was released from the hospital after 2 months, and thinks that she may need to go back because she is not  feeling well. She will call back to schedule telehealth appointment.

## 2024-06-29 NOTE — Telephone Encounter (Signed)
 Called requesting office back they stated Dr. Carlie said he is fine with her staying on Eliquis  for this procedure no need to hold and the anesthesia they will be using is general

## 2024-07-01 ENCOUNTER — Telehealth: Payer: Self-pay

## 2024-07-01 DIAGNOSIS — R627 Adult failure to thrive: Secondary | ICD-10-CM | POA: Diagnosis not present

## 2024-07-01 DIAGNOSIS — I700 Atherosclerosis of aorta: Secondary | ICD-10-CM | POA: Diagnosis not present

## 2024-07-01 DIAGNOSIS — N1832 Chronic kidney disease, stage 3b: Secondary | ICD-10-CM | POA: Diagnosis not present

## 2024-07-01 DIAGNOSIS — D631 Anemia in chronic kidney disease: Secondary | ICD-10-CM | POA: Diagnosis not present

## 2024-07-01 DIAGNOSIS — I130 Hypertensive heart and chronic kidney disease with heart failure and stage 1 through stage 4 chronic kidney disease, or unspecified chronic kidney disease: Secondary | ICD-10-CM | POA: Diagnosis not present

## 2024-07-01 DIAGNOSIS — I2510 Atherosclerotic heart disease of native coronary artery without angina pectoris: Secondary | ICD-10-CM | POA: Diagnosis not present

## 2024-07-01 DIAGNOSIS — I5033 Acute on chronic diastolic (congestive) heart failure: Secondary | ICD-10-CM | POA: Diagnosis not present

## 2024-07-01 DIAGNOSIS — I495 Sick sinus syndrome: Secondary | ICD-10-CM | POA: Diagnosis not present

## 2024-07-01 DIAGNOSIS — E440 Moderate protein-calorie malnutrition: Secondary | ICD-10-CM | POA: Diagnosis not present

## 2024-07-01 DIAGNOSIS — I4819 Other persistent atrial fibrillation: Secondary | ICD-10-CM | POA: Diagnosis not present

## 2024-07-01 NOTE — Telephone Encounter (Signed)
 Copied from CRM [id number]. Topic: General - Other >> Jul 01, 2024  8:39 AM Robinson H wrote: Reason for CRM: Calling to notify provider they tried to go out to see patient yesterday and she wanted them to come back today opening case today  [employer] [phone]

## 2024-07-03 ENCOUNTER — Emergency Department (HOSPITAL_COMMUNITY)

## 2024-07-03 ENCOUNTER — Emergency Department (HOSPITAL_COMMUNITY)
Admission: EM | Admit: 2024-07-03 | Discharge: 2024-07-03 | Disposition: A | Discharge status: Home / Self Care | Attending: Emergency Medicine | Admitting: Emergency Medicine

## 2024-07-03 ENCOUNTER — Encounter (HOSPITAL_COMMUNITY): Payer: Self-pay

## 2024-07-03 ENCOUNTER — Other Ambulatory Visit: Payer: Self-pay

## 2024-07-03 DIAGNOSIS — J322 Chronic ethmoidal sinusitis: Secondary | ICD-10-CM | POA: Diagnosis not present

## 2024-07-03 DIAGNOSIS — N1832 Chronic kidney disease, stage 3b: Secondary | ICD-10-CM | POA: Insufficient documentation

## 2024-07-03 DIAGNOSIS — Z87891 Personal history of nicotine dependence: Secondary | ICD-10-CM | POA: Insufficient documentation

## 2024-07-03 DIAGNOSIS — Z7401 Bed confinement status: Secondary | ICD-10-CM | POA: Diagnosis not present

## 2024-07-03 DIAGNOSIS — M545 Low back pain, unspecified: Secondary | ICD-10-CM | POA: Insufficient documentation

## 2024-07-03 DIAGNOSIS — Z950 Presence of cardiac pacemaker: Secondary | ICD-10-CM | POA: Diagnosis not present

## 2024-07-03 DIAGNOSIS — S299XXA Unspecified injury of thorax, initial encounter: Secondary | ICD-10-CM | POA: Diagnosis not present

## 2024-07-03 DIAGNOSIS — I2510 Atherosclerotic heart disease of native coronary artery without angina pectoris: Secondary | ICD-10-CM | POA: Insufficient documentation

## 2024-07-03 DIAGNOSIS — S3992XA Unspecified injury of lower back, initial encounter: Secondary | ICD-10-CM | POA: Diagnosis not present

## 2024-07-03 DIAGNOSIS — Z96611 Presence of right artificial shoulder joint: Secondary | ICD-10-CM | POA: Insufficient documentation

## 2024-07-03 DIAGNOSIS — S29002A Unspecified injury of muscle and tendon of back wall of thorax, initial encounter: Secondary | ICD-10-CM | POA: Diagnosis not present

## 2024-07-03 DIAGNOSIS — I10 Essential (primary) hypertension: Secondary | ICD-10-CM | POA: Diagnosis not present

## 2024-07-03 DIAGNOSIS — Z91040 Latex allergy status: Secondary | ICD-10-CM | POA: Insufficient documentation

## 2024-07-03 DIAGNOSIS — R0789 Other chest pain: Secondary | ICD-10-CM | POA: Insufficient documentation

## 2024-07-03 DIAGNOSIS — W0110XA Fall on same level from slipping, tripping and stumbling with subsequent striking against unspecified object, initial encounter: Secondary | ICD-10-CM | POA: Insufficient documentation

## 2024-07-03 DIAGNOSIS — S0093XA Contusion of unspecified part of head, initial encounter: Secondary | ICD-10-CM | POA: Diagnosis not present

## 2024-07-03 DIAGNOSIS — I130 Hypertensive heart and chronic kidney disease with heart failure and stage 1 through stage 4 chronic kidney disease, or unspecified chronic kidney disease: Secondary | ICD-10-CM | POA: Diagnosis not present

## 2024-07-03 DIAGNOSIS — S0990XA Unspecified injury of head, initial encounter: Secondary | ICD-10-CM | POA: Insufficient documentation

## 2024-07-03 DIAGNOSIS — Z79899 Other long term (current) drug therapy: Secondary | ICD-10-CM | POA: Insufficient documentation

## 2024-07-03 DIAGNOSIS — R519 Headache, unspecified: Secondary | ICD-10-CM | POA: Diagnosis not present

## 2024-07-03 DIAGNOSIS — M47814 Spondylosis without myelopathy or radiculopathy, thoracic region: Secondary | ICD-10-CM | POA: Diagnosis not present

## 2024-07-03 DIAGNOSIS — I5033 Acute on chronic diastolic (congestive) heart failure: Secondary | ICD-10-CM | POA: Insufficient documentation

## 2024-07-03 DIAGNOSIS — R531 Weakness: Secondary | ICD-10-CM | POA: Diagnosis not present

## 2024-07-03 DIAGNOSIS — Z7901 Long term (current) use of anticoagulants: Secondary | ICD-10-CM | POA: Insufficient documentation

## 2024-07-03 DIAGNOSIS — W19XXXA Unspecified fall, initial encounter: Secondary | ICD-10-CM | POA: Diagnosis not present

## 2024-07-03 LAB — CBC WITH DIFFERENTIAL/PLATELET
Abs Immature Granulocytes: 0.02 K/uL (ref 0.00–0.07)
Basophils Absolute: 0 K/uL (ref 0.0–0.1)
Basophils Relative: 0 %
Eosinophils Absolute: 0.1 K/uL (ref 0.0–0.5)
Eosinophils Relative: 2 %
HCT: 34.9 % — ABNORMAL LOW (ref 36.0–46.0)
Hemoglobin: 10.8 g/dL — ABNORMAL LOW (ref 12.0–15.0)
Immature Granulocytes: 1 %
Lymphocytes Relative: 17 %
Lymphs Abs: 0.7 K/uL (ref 0.7–4.0)
MCH: 27.7 pg (ref 26.0–34.0)
MCHC: 30.9 g/dL (ref 30.0–36.0)
MCV: 89.5 fL (ref 80.0–100.0)
Monocytes Absolute: 0.2 K/uL (ref 0.1–1.0)
Monocytes Relative: 5 %
Neutro Abs: 3 K/uL (ref 1.7–7.7)
Neutrophils Relative %: 75 %
Platelets: 210 K/uL (ref 150–400)
RBC: 3.9 MIL/uL (ref 3.87–5.11)
RDW: 14.5 % (ref 11.5–15.5)
WBC: 4 K/uL (ref 4.0–10.5)
nRBC: 0 % (ref 0.0–0.2)

## 2024-07-03 LAB — BASIC METABOLIC PANEL WITH GFR
Anion gap: 9 (ref 5–15)
BUN: 30 mg/dL — ABNORMAL HIGH (ref 8–23)
CO2: 24 mmol/L (ref 22–32)
Calcium: 10.8 mg/dL — ABNORMAL HIGH (ref 8.9–10.3)
Chloride: 106 mmol/L (ref 98–111)
Creatinine, Ser: 1.31 mg/dL — ABNORMAL HIGH (ref 0.44–1.00)
GFR, Estimated: 40 mL/min — ABNORMAL LOW (ref >60)
Glucose, Bld: 87 mg/dL (ref 70–99)
Potassium: 3.5 mmol/L (ref 3.5–5.1)
Sodium: 139 mmol/L (ref 135–145)

## 2024-07-03 MED ORDER — PANTOPRAZOLE SODIUM 40 MG PO TBEC
40.0000 mg | DELAYED_RELEASE_TABLET | Freq: Every day | ORAL | Status: DC
  Administered 2024-07-03: 40 mg via ORAL
  Filled 2024-07-03: qty 1

## 2024-07-03 MED ORDER — EMPAGLIFLOZIN 10 MG PO TABS
10.0000 mg | ORAL_TABLET | Freq: Every day | ORAL | Status: DC
  Administered 2024-07-03: 10 mg via ORAL
  Filled 2024-07-03: qty 1

## 2024-07-03 MED ORDER — ACETAMINOPHEN 325 MG PO TABS: 650.0000 mg | ORAL_TABLET | Freq: Four times a day (QID) | ORAL | Status: DC | PRN

## 2024-07-03 MED ORDER — SUCRALFATE 1 GM/10ML PO SUSP
1.0000 g | Freq: Two times a day (BID) | ORAL | Status: DC
  Administered 2024-07-03: 1 g via ORAL
  Filled 2024-07-03: qty 10

## 2024-07-03 MED ORDER — AMIODARONE HCL 200 MG PO TABS
200.0000 mg | ORAL_TABLET | Freq: Two times a day (BID) | ORAL | Status: DC
  Administered 2024-07-03: 200 mg via ORAL
  Filled 2024-07-03: qty 1

## 2024-07-03 MED ORDER — ADULT MULTIVITAMIN W/MINERALS CH
1.0000 | ORAL_TABLET | Freq: Every day | ORAL | Status: DC
  Administered 2024-07-03: 1 via ORAL
  Filled 2024-07-03: qty 1

## 2024-07-03 MED ORDER — POLYETHYLENE GLYCOL 3350 17 G PO PACK
17.0000 g | PACK | Freq: Two times a day (BID) | ORAL | Status: DC
  Administered 2024-07-03: 17 g via ORAL
  Filled 2024-07-03: qty 1

## 2024-07-03 MED ORDER — IPRATROPIUM BROMIDE 0.06 % NA SOLN
2.0000 | Freq: Two times a day (BID) | NASAL | Status: DC | PRN
  Filled 2024-07-03: qty 15

## 2024-07-03 MED ORDER — APIXABAN 2.5 MG PO TABS
2.5000 mg | ORAL_TABLET | Freq: Two times a day (BID) | ORAL | Status: DC
Start: 2024-07-03 — End: 2024-07-03
  Administered 2024-07-03: 2.5 mg via ORAL
  Filled 2024-07-03: qty 1

## 2024-07-03 MED ORDER — ISOSORBIDE MONONITRATE ER 30 MG PO TB24
30.0000 mg | ORAL_TABLET | Freq: Every day | ORAL | Status: DC
  Administered 2024-07-03: 30 mg via ORAL
  Filled 2024-07-03: qty 1

## 2024-07-03 MED ORDER — MEGESTROL ACETATE 40 MG PO TABS
40.0000 mg | ORAL_TABLET | Freq: Every morning | ORAL | Status: DC
Start: 2024-07-04 — End: 2024-07-03
  Filled 2024-07-03: qty 1

## 2024-07-03 MED ORDER — ACETAMINOPHEN 500 MG PO TABS
1000.0000 mg | ORAL_TABLET | Freq: Once | ORAL | Status: AC
  Administered 2024-07-03: 1000 mg via ORAL
  Filled 2024-07-03: qty 2

## 2024-07-03 MED ORDER — SENNOSIDES-DOCUSATE SODIUM 8.6-50 MG PO TABS
1.0000 | ORAL_TABLET | Freq: Two times a day (BID) | ORAL | Status: DC
  Administered 2024-07-03: 1 via ORAL
  Filled 2024-07-03: qty 1

## 2024-07-03 MED ORDER — METOPROLOL TARTRATE 25 MG PO TABS
25.0000 mg | ORAL_TABLET | Freq: Two times a day (BID) | ORAL | Status: DC
  Administered 2024-07-03: 25 mg via ORAL
  Filled 2024-07-03: qty 1

## 2024-07-03 MED ORDER — ALUM & MAG HYDROXIDE-SIMETH 200-200-20 MG/5ML PO SUSP: 30.0000 mL | ORAL | Status: DC | PRN

## 2024-07-03 MED ORDER — OXYCODONE HCL 5 MG PO TABS
5.0000 mg | ORAL_TABLET | Freq: Once | ORAL | Status: AC
  Administered 2024-07-03: 5 mg via ORAL
  Filled 2024-07-03: qty 1

## 2024-07-03 MED ORDER — VITAMIN B-12 1000 MCG PO TABS
500.0000 ug | ORAL_TABLET | Freq: Every day | ORAL | Status: DC
  Administered 2024-07-03: 500 ug via ORAL
  Filled 2024-07-03: qty 1

## 2024-07-03 MED ORDER — FUROSEMIDE 20 MG PO TABS: 10.0000 mg | ORAL_TABLET | Freq: Every day | ORAL | Status: DC | PRN

## 2024-07-03 MED ORDER — LORATADINE 10 MG PO TABS: 10.0000 mg | ORAL_TABLET | Freq: Every day | ORAL | Status: DC | PRN

## 2024-07-03 MED ORDER — MIRTAZAPINE 15 MG PO TABS: 15.0000 mg | ORAL_TABLET | Freq: Every day | ORAL | Status: DC

## 2024-07-03 MED ORDER — NITROGLYCERIN 0.4 MG SL SUBL: 0.4000 mg | SUBLINGUAL_TABLET | SUBLINGUAL | Status: DC | PRN

## 2024-07-03 NOTE — ED Provider Notes (Addendum)
 [place] EMERGENCY DEPARTMENT AT [facility] Provider Note  CSN: [id number] Arrival date & time: 07/03/24 1026  Chief Complaint(s) Fall  HPI Barbara [name] is a 83 y.o. female history of A-fib on Eliquis , hypertension, hyperlipidemia presenting to the emergency department with fall.  Patient reports that she was trying to get off the bedside commode into the bed when she lost her balance and fell backward, hit the back of her head.  Did have recent left hip surgery but otherwise has been doing well.  Has home health but did not show today.  Reports some left chest pain and low back pain also.  Low back pain has been since she was in rehab, reports that she fell on her bottom and has had some persistent low back pain.  Taking Tylenol  at home.  No numbness or tingling, fevers or chills, difficulty breathing.   Past Medical History Past Medical History:  Diagnosis Date   A-fib [facility])    Allergy    CAD (coronary artery disease)    sees Dr. Debby Sor  cardiac stents - 2000   CHF (congestive heart failure) (HCC)    Colon polyps    Complication of anesthesia    rash/hives with caines   Dyspnea    02/12/18  when my heart gets out of rhythm, it has not been out of rhythym- since I have been on Tikosyn  (11/2017)   Dysrhythmia    afib fib   GERD (gastroesophageal reflux disease)    takes OTC- Omeprazole - prn   Heart murmur    History of kidney stones    History of stress test    show normal perfusion without scar or ischemia, post EF 68%   Hx of echocardiogram    show an EF 55%-60% range with grade 1 diastolic dysfunction, she had mitral anular calcification with mild MR, moderate LA dilation and mild pulmonary hypertension with a PA estimated pressure of 39mm   Hyperlipidemia    Hypertension    NSTEMI (non-ST elevated myocardial infarction) (HCC)    Osteoarthritis    Pacemaker    Pneumonia    hx of 2015    PONV (postoperative nausea and vomiting)    Sleep apnea     Patient Active Problem List   Diagnosis Date Noted   Paroxysmal atrial fibrillation with rapid ventricular response (HCC) 06/20/2024   Elevated troponin 05/18/2024   Acute on chronic diastolic congestive heart failure (HCC) 05/18/2024   Hypophosphatemia 05/18/2024   Hypokalemia 05/18/2024   Moderate protein malnutrition (HCC) 05/18/2024   Pancytopenia (HCC) 05/18/2024   Failure to thrive in adult 05/17/2024   Loss of appetite 05/04/2024   Hypercalcemia 04/06/2024   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   Normocytic anemia 03/09/2024   GI bleed 03/09/2024   ABLA (acute blood loss anemia) 03/09/2024   Abnormal weight loss 03/08/2024   Ankle edema, bilateral 11/19/2023   Chronic heart failure with preserved ejection fraction (HFpEF) (HCC) 10/22/2023   Closed displaced fracture of left femoral neck (HCC) 10/22/2023   Closed left subtrochanteric femur fracture (HCC) 09/26/2023   Bronchitis 08/04/2022   Ureteral stone 06/26/2022   Renal stones 06/26/2022   S/P cystoscopy 06/25/2022   Nephrolithiasis, 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, 5mm in size, leading to moderate left hydroureteronephrosis and significant left perinephric stranding /edema 06/09/2022   OSA (obstructive sleep apnea) 06/09/2022   Acquired trigger finger of right index finger 05/10/2021   Trigger thumb of left hand 05/10/2021   Ulnar nerve neuropathy  03/08/2021   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   Persistent atrial fibrillation (HCC) 10/07/2020   Chronic kidney disease, stage 3b (HCC) 05/20/2020   Apical variant hypertrophic cardiomyopathy (HCC)    Chest pain of uncertain etiology    Chest pain    S/P reverse total shoulder arthroplasty, 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   Encounter for orthopedic follow-up care 02/18/2020   PAF (paroxysmal atrial fibrillation) (HCC) 02/28/2019   Demand myocardial infarction (HCC) 02/25/2019   Pain in left knee 01/06/2018   Dysphonia 12/29/2017   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 Authorizing Provider  acetaminophen  (TYLENOL ) 325 MG tablet Take 2 tablets (650 mg total) by mouth every 6 (six) hours as needed for mild pain (pain score 1-3) or fever (or Fever >/= 101). 05/22/24  Yes Sebastian Toribio GAILS, MD  alum & mag hydroxide-simeth (MAALOX/MYLANTA) 200-200-20 MG/5ML suspension Take 30 mLs by mouth every 4 (four) hours as needed for indigestion. 09/30/23  Yes Leotis Bogus, MD  amiodarone  (PACERONE ) 200 MG tablet Take 1 tablet (200 mg total) by mouth 2 (two) times daily. 06/26/24  Yes Lue Elsie BROCKS, MD  apixaban  (ELIQUIS ) 2.5 MG TABS tablet Take 1 tablet (2.5 mg total) by mouth 2 (two) times daily. 01/21/24  Yes Johnny Garnette LABOR, MD  cyanocobalamin  (VITAMIN B12) 500 MCG tablet Take 500 mcg by mouth daily.   Yes [provider]  diclofenac  Sodium (VOLTAREN ) 1 % GEL Apply 2 g topically 4 (four) times daily for 7 days, THEN 2 g 4 (four) times daily as needed. Apply to right lower ribs. Patient taking differently: Apply 2 g 4 (four) times daily as needed. Apply to right lower ribs. 05/24/24 08/29/24 Yes Sebastian Toribio GAILS, MD  empagliflozin  (JARDIANCE ) 10 MG TABS tablet Take 1 tablet (10 mg total) by mouth daily. 01/21/24  Yes Johnny Garnette LABOR, MD  furosemide  (LASIX ) 20 MG tablet Take 10 mg by mouth daily as needed (for swelling of the ankles).   Yes [provider]  ipratropium (ATROVENT ) 0.06 %  nasal spray Place 2 sprays into both nostrils 2 (two) times daily as needed (allergies). 04/28/18  Yes [provider]  isosorbide  mononitrate (IMDUR ) 30 MG 24 hr tablet Take 1 tablet (30 mg total) by mouth daily. 02/24/24  Yes Johnny Garnette LABOR, MD  ketoconazole  (NIZORAL ) 2 % cream Apply 1 Application topically 2 (two) times daily as needed for irritation. 12/26/23  Yes Johnny Garnette LABOR, MD  loratadine  (CLARITIN ) 10 MG tablet Take 10 mg by mouth daily as needed for allergies.   Yes [provider]  megestrol  (MEGACE ) 40 MG tablet Take 1 tablet (40 mg total) by mouth in the morning. 04/30/24  Yes Johnny Garnette LABOR, MD  metoprolol  tartrate (LOPRESSOR ) 25 MG tablet Take 1 tablet (25 mg total) by mouth 2 (two) times daily. 06/26/24  Yes Lue Elsie BROCKS, MD  mirtazapine  (REMERON ) 15 MG tablet Take 1 tablet (15 mg total) by mouth at bedtime. 05/04/24  Yes Johnny,  Garnette LABOR, MD  Multiple Vitamin (MULTIVITAMIN WITH MINERALS) TABS tablet Take 1 tablet by mouth daily. 06/27/24  Yes Lue Elsie BROCKS, MD  nitroGLYCERIN  (NITROSTAT ) 0.4 MG SL tablet Place 1 tablet (0.4 mg total) under the tongue every 5 (five) minutes as needed for chest pain. 04/06/24  Yes Johnny Garnette LABOR, MD  Nutritional Supplements (BOOST/FIBER PO) Take 1 Bottle by mouth 3 (three) times daily.   Yes [provider]  pantoprazole  (PROTONIX ) 40 MG tablet Take 1 tablet (40 mg total) by mouth daily. 02/13/24 02/13/25 Yes Johnny Garnette LABOR, MD  polyethylene glycol (MIRALAX  / GLYCOLAX ) 17 g packet Take 17 g by mouth 2 (two) times daily. Patient taking differently: Take 17 g by mouth daily as needed for mild constipation. 05/22/24  Yes Sebastian Toribio GAILS, MD  senna-docusate (SENOKOT-S) 8.6-50 MG tablet Take 1 tablet by mouth 2 (two) times daily. 05/22/24  Yes Sebastian Toribio GAILS, MD  sucralfate  (CARAFATE ) 1 GM/10ML suspension Take 10 mLs (1 g total) by mouth 2 (two) times daily. 06/26/24  Yes Lue Elsie BROCKS, MD  triamcinolone  (NASACORT) 55  MCG/ACT nasal inhaler Place 1 spray into both nostrils daily as needed (for allergies).  10/17/12  Yes Johnny Garnette LABOR, MD                                                                                                                                    Past Surgical History Past Surgical History:  Procedure Laterality Date   CARDIAC CATHETERIZATION     11/2017   cardiac stents  2000   COLONOSCOPY  01-05-14   per Dr. Norleen Hint, clear, no repeats needed    COLONOSCOPY     CORONARY STENT PLACEMENT  2000   in LAD   CYSTOSCOPY WITH RETROGRADE PYELOGRAM, URETEROSCOPY AND STENT PLACEMENT Left 06/11/2022   Procedure: CYSTOSCOPY WITH RETROGRADE PYELOGRAM, LEFT STENT PLACEMENT;  Surgeon: Carolee Sherwood JONETTA DOUGLAS, MD;  Location: [facility];  Service: Urology;  Laterality: Left;   CYSTOSCOPY/URETEROSCOPY/HOLMIUM LASER/STENT PLACEMENT Left 06/25/2022   Procedure: CYSTOSCOPY LEFT URETEROSCOPY/HOLMIUM LASER/STENT PLACEMENT;  Surgeon: Watt Norleen, MD;  Location: [facility];  Service: Urology;  Laterality: Left;  1 HR FOR THIS CASE   DIRECT LARYNGOSCOPY WITH RADIAESSE INJECTION N/A 02/13/2018   Procedure: DIRECT LARYNGOSCOPY WITH RADIAESSE INJECTION;  Surgeon: Carlie Clark, MD;  Location: [facility] OR;  Service: ENT;  Laterality: N/A;   ESOPHAGOGASTRODUODENOSCOPY N/A 03/11/2024   Procedure: EGD (ESOPHAGOGASTRODUODENOSCOPY);  Surgeon: Rosalie Kitchens, MD;  Location: [facility] ENDOSCOPY;  Service: Gastroenterology;  Laterality: N/A;   EYE SURGERY Left    CATARACT REMOVAL   HIP ARTHROPLASTY Left 09/27/2023   Procedure: ARTHROPLASTY BIPOLAR HIP (HEMIARTHROPLASTY);  Surgeon: Beverley Evalene JONETTA, MD;  Location: [facility] OR;  Service: Orthopedics;  Laterality: Left;   KNEE ARTHROSCOPY Left 01/06/2015   Procedure: LEFT KNEE ARTHROSCOPY, abrasion chondroplasty of the medial femerol condryl,medial and lateral menisectomy, microfracture , synovectomy of the suprpatellar pouch;  Surgeon: Tanda Heading, MD;  Location: [facility];  Service: Orthopedics;  Laterality: Left;    LEFT HEART CATH AND CORONARY ANGIOGRAPHY N/A 02/26/2019   Procedure: LEFT HEART CATH AND CORONARY ANGIOGRAPHY;  Surgeon: Burnard Debby LABOR, MD;  Location: [facility] INVASIVE CV LAB;  Service: Cardiovascular;  Laterality: N/A;   LEFT HEART CATH AND CORONARY ANGIOGRAPHY N/A 05/06/2022   Procedure: LEFT HEART CATH AND CORONARY ANGIOGRAPHY;  Surgeon: Mady Bruckner, MD;  Location: [facility];  Service: Cardiovascular;  Laterality: N/A;   MICROLARYNGOSCOPY W/VOCAL CORD INJECTION N/A 08/07/2018   Procedure: MICROLARYNGOSCOPY WITH VOCAL CORD INJECTION OF PROLARYN;  Surgeon: Carlie Clark, MD;  Location: [facility] OR;  Service: ENT;  Laterality: N/A;  JET VENTILATION   PACEMAKER IMPLANT N/A 05/08/2022   Procedure: PACEMAKER IMPLANT;  Surgeon: Inocencio Soyla Lunger, MD;  Location: [facility] INV[facility] CV LAB;  Service: Cardiovascular;  Laterality: N/A;   REVERSE SHOULDER ARTHROPLASTY Right 03/23/2020   Procedure: REVERSE SHOULDER ARTHROPLASTY;  Surgeon: Sharl Selinda Dover, MD;  Location: [facility] OR;  Service: Orthopedics;  Laterality: Right;   VAGINAL HYSTERECTOMY  1971   Family History Family History  Problem Relation Age of Onset   Cancer Maternal Grandmother    Heart disease Maternal Grandfather     Social History Social History   Tobacco Use   Smoking status: Former    Current packs/day: 0.00    Types: Cigarettes    Start date: 11/04/1956    Quit date: 11/04/1996    Years since quitting: 27.6   Smokeless tobacco: Never  Vaping Use   Vaping status: Never Used  Substance Use Topics   Alcohol use: No    Alcohol/week: 0.0 standard drinks of alcohol   Drug use: No   Allergies Lidocaine , Morphine , Procaine hcl, Sulfonamide derivatives, Amlodipine , Ezetimibe-simvastatin, Latex, Norco [hydrocodone -acetaminophen ], Tape, and Tramadol   Review of Systems Review of Systems  All other systems reviewed and are negative.   Physical Exam Vital Signs  I have reviewed the triage vital signs BP 135/73   Pulse 60    Temp 97.6 F (36.4 C)   Resp 16   Ht 5' 8 (1.727 m)   Wt 54.4 kg   SpO2 100%   BMI 18.25 kg/m  Physical Exam Vitals and nursing note reviewed.  Constitutional:      General: She is not in acute distress.    Appearance: She is well-developed.  HENT:     Head: Normocephalic and atraumatic.     Mouth/Throat:     Mouth: Mucous membranes are moist.  Eyes:     Pupils: Pupils are equal, round, and reactive to light.  Cardiovascular:     Rate and Rhythm: Normal rate and regular rhythm.     Heart sounds: No murmur heard. Pulmonary:     Effort: Pulmonary effort is normal. No respiratory distress.     Breath sounds: Normal breath sounds.  Abdominal:     General: Abdomen is flat.     Palpations: Abdomen is soft.     Tenderness: There is no abdominal tenderness.  Musculoskeletal:        General: No tenderness.     Right lower leg: No edema.     Left lower leg: No edema.     Comments: No midline C, T, spine tenderness.  Mild lumbar spine tenderness in the lower lumbar spine extremities atraumatic, no focal tenderness full range of motion throughout all joints, no deformity.  Mild left lateral chest wall tenderness without crepitus  Skin:  General: Skin is warm and dry.  Neurological:     General: No focal deficit present.     Mental Status: She is alert. Mental status is at baseline.  Psychiatric:        Mood and Affect: Mood normal.        Behavior: Behavior normal.     ED Results and Treatments Labs (all labs ordered are listed, but only abnormal results are displayed) Labs Reviewed  BASIC METABOLIC PANEL WITH GFR - Abnormal; Notable for the following components:      Result Value   BUN 30 (*)    Creatinine, Ser 1.31 (*)    Calcium  10.8 (*)    GFR, Estimated 40 (*)    All other components within normal limits  CBC WITH DIFFERENTIAL/PLATELET - Abnormal; Notable for the following components:   Hemoglobin 10.8 (*)    HCT 34.9 (*)    All other components within normal  limits                                                                                                                          Radiology CT Lumbar Spine Wo Contrast Result Date: 07/03/2024 EXAM: CT OF THE LUMBAR SPINE WITHOUT CONTRAST 07/03/2024 12:55:00 PM TECHNIQUE: CT of the lumbar spine was performed without the administration of intravenous contrast. Multiplanar reformatted images are provided for review. Automated exposure control, iterative reconstruction, and/or weight based adjustment of the mA/kV was utilized to reduce the radiation dose to as low as reasonably achievable. COMPARISON: 03/24/2024 CLINICAL HISTORY: Back trauma, no prior imaging (Age >= 16y). Chest and back trauma from fall earlier today. FINDINGS: BONES AND ALIGNMENT: Normal vertebral body heights. No acute fracture or suspicious bone lesion. Normal alignment. DEGENERATIVE CHANGES: T11-12: Disc narrowing with vacuum phenomenon, anterior endplate spurring. L3-4: Mild disc narrowing with vacuum phenomenon. Bilateral facet degenerative joint disease (DJD). L4-5: Moderate disc narrowing with vacuum phenomenon and circumferential endplate spurring. Bilateral facet DJD. L5-S1: Advanced bilateral facet DJD allowing grade 1 anterolisthesis. Narrowing of the interspace with vacuum phenomenon. SOFT TISSUES: No paraspinal hematoma. VASCULATURE: Scattered calcified aortoiliac plaque. KIDNEYS, URETERS, AND BLADDER: Multiple cortical lesions in kidneys, some of which can be characterized as simple cysts, largest 3.8 cm 9 HU from the right lower pole. IMPRESSION: 1. No evidence of acute traumatic injury. 2. Degenerative changes at T11-12, L3-4, L4-5, and L5-S1 as described above. Electronically signed by: Katheleen Faes MD 07/03/2024 01:07 PM EDT RP Workstation: [id number]   CT Chest Wo Contrast Result Date: 07/03/2024 EXAM: CT CHEST WITHOUT CONTRAST 07/03/2024 12:55:00 PM TECHNIQUE: CT of the chest was performed without the administration of  intravenous contrast. Multiplanar reformatted images are provided for review. Automated exposure control, iterative reconstruction, and/or weight based adjustment of the mA/kV was utilized to reduce the radiation dose to as low as reasonably achievable. COMPARISON: 06/20/2024 CLINICAL HISTORY: Chest trauma, blunt. Chest and back trauma from fall earlier today. FINDINGS: MEDIASTINUM AND LYMPH NODES: Left atrial enlargement. Mild left  ventricular dilatation. Scattered coronary calcifications. Scattered aortic valve leaflet calcifications. Minimal calcified plaque in the aortic arch and distal descending thoracic aorta. Right subclavian transvenous pacing leads. Mildly dilated central pulmonary arteries. LUNGS AND PLEURA: Dependent atelectasis/scarring posteriorly in both lung bases. Pleuroparenchymal scarring posterolaterally in both upper lobes as before. SOFT TISSUES/BONES: Vertebral endplate spurring at multiple levels in the mid and lower thoracic spine. Right shoulder arthroplasty. UPPER ABDOMEN: Multiple cortical lesions in kidneys, some of which can be characterized as simple cysts, largest at least 3.7 cm, right mid kidney, incompletely visualized. IMPRESSION: 1. No acute findings. Electronically signed by: Katheleen Faes MD 07/03/2024 01:03 PM EDT RP Workstation: [id number]   CT Head Wo Contrast Result Date: 07/03/2024 EXAM: CT HEAD AND CERVICAL SPINE 07/03/2024 11:07:45 AM TECHNIQUE: CT of the head and cervical spine was performed without the administration of intravenous contrast. Multiplanar reformatted images are provided for review. Automated exposure control, iterative reconstruction, and/or weight based adjustment of the mA/kV was utilized to reduce the radiation dose to as low as reasonably achievable. COMPARISON: 05/08/2024 CLINICAL HISTORY: Headache, new onset (Age >= 51y). Patient from home via EMS for eval of fall while attempting to get up off of the bedside commode. On Eliquis  and did hit  head, small hematoma to posterior head. FINDINGS: CT HEAD BRAIN AND VENTRICLES: No acute intracranial hemorrhage. No mass effect or midline shift. No abnormal extra-axial fluid collection. No evidence of acute infarct. No hydrocephalus. ORBITS: No acute abnormality. SINUSES AND MASTOIDS: Bubbly opacities identified within the right posterior ethmoid air cells. SOFT TISSUES AND SKULL: No acute skull fracture. No acute soft tissue abnormality. CT CERVICAL SPINE BONES AND ALIGNMENT: No acute fracture or subluxation. Normal alignment of the cervical spine. DEGENERATIVE CHANGES: Multilevel disc space narrowing and endplate spurring compatible with cervical spondylosis. SOFT TISSUES: No prevertebral soft tissue swelling. IMPRESSION: 1. No acute intracranial abnormality. 2. No acute fracture or traumatic malalignment of the cervical spine. 3. Mild ethmoid sinus disease. 4. Cervical degenerative disc disease. Electronically signed by: Waddell Calk MD 07/03/2024 11:27 AM EDT RP Workstation: [id number]   CT Cervical Spine Wo Contrast Result Date: 07/03/2024 EXAM: CT HEAD AND CERVICAL SPINE 07/03/2024 11:07:45 AM TECHNIQUE: CT of the head and cervical spine was performed without the administration of intravenous contrast. Multiplanar reformatted images are provided for review. Automated exposure control, iterative reconstruction, and/or weight based adjustment of the mA/kV was utilized to reduce the radiation dose to as low as reasonably achievable. COMPARISON: 05/08/2024 CLINICAL HISTORY: Headache, new onset (Age >= 49y). Patient from home via EMS for eval of fall while attempting to get up off of the bedside commode. On Eliquis  and did hit head, small hematoma to posterior head. FINDINGS: CT HEAD BRAIN AND VENTRICLES: No acute intracranial hemorrhage. No mass effect or midline shift. No abnormal extra-axial fluid collection. No evidence of acute infarct. No hydrocephalus. ORBITS: No acute abnormality. SINUSES AND  MASTOIDS: Bubbly opacities identified within the right posterior ethmoid air cells. SOFT TISSUES AND SKULL: No acute skull fracture. No acute soft tissue abnormality. CT CERVICAL SPINE BONES AND ALIGNMENT: No acute fracture or subluxation. Normal alignment of the cervical spine. DEGENERATIVE CHANGES: Multilevel disc space narrowing and endplate spurring compatible with cervical spondylosis. SOFT TISSUES: No prevertebral soft tissue swelling. IMPRESSION: 1. No acute intracranial abnormality. 2. No acute fracture or traumatic malalignment of the cervical spine. 3. Mild ethmoid sinus disease. 4. Cervical degenerative disc disease. Electronically signed by: Waddell Calk MD 07/03/2024 11:27 AM EDT RP Workstation: [id number]  Pertinent labs & imaging results that were available during my care of the patient were reviewed by me and considered in my medical decision making (see MDM for details).  Medications Ordered in ED Medications  acetaminophen  (TYLENOL ) tablet 650 mg (has no administration in time range)  loratadine  (CLARITIN ) tablet 10 mg (has no administration in time range)  ipratropium (ATROVENT ) 0.06 % nasal spray 2 spray (has no administration in time range)  alum & mag hydroxide-simeth (MAALOX/MYLANTA) 200-200-20 MG/5ML suspension 30 mL (has no administration in time range)  apixaban  (ELIQUIS ) tablet 2.5 mg (has no administration in time range)  empagliflozin  (JARDIANCE ) tablet 10 mg (has no administration in time range)  pantoprazole  (PROTONIX ) EC tablet 40 mg (has no administration in time range)  isosorbide  mononitrate (IMDUR ) 24 hr tablet 30 mg (has no administration in time range)  nitroGLYCERIN  (NITROSTAT ) SL tablet 0.4 mg (has no administration in time range)  megestrol  (MEGACE ) tablet 40 mg (has no administration in time range)  mirtazapine  (REMERON ) tablet 15 mg (has no administration in time range)  furosemide  (LASIX ) tablet 10 mg (has no administration in time range)   polyethylene glycol (MIRALAX  / GLYCOLAX ) packet 17 g (has no administration in time range)  senna-docusate (Senokot-S) tablet 1 tablet (has no administration in time range)  cyanocobalamin  (VITAMIN B12) tablet 500 mcg (has no administration in time range)  amiodarone  (PACERONE ) tablet 200 mg (has no administration in time range)  metoprolol  tartrate (LOPRESSOR ) tablet 25 mg (has no administration in time range)  sucralfate  (CARAFATE ) 1 GM/10ML suspension 1 g (has no administration in time range)  multivitamin with minerals tablet 1 tablet (has no administration in time range)  acetaminophen  (TYLENOL ) tablet 1,000 mg (1,000 mg Oral Given 07/03/24 1234)  oxyCODONE  (Oxy IR/ROXICODONE ) immediate release tablet 5 mg (5 mg Oral Given 07/03/24 1234)                                                                                                                                     Procedures Procedures  (including critical care time)  Medical Decision Making / ED Course   MDM:  83 year old presenting to the emergency department with fall.  Patient was activated as a trauma given she fell while taking Eliquis  and hit her head.  She is overall extremely well-appearing.  She reports some left-sided chest pain, low back pain.  Low back pain has been present for a week and not significantly different.  Lungs are clear.  CT head and C-spine were obtained with no evidence of fracture or acute intracranial process such as bleeding.  Seems low back pain was from prior fall but will obtain CT lumbar spine.  She does have some chest wall tenderness will obtain CT chest to evaluate for rib fracture or pneumothorax.  If these are also reassuring anticipate likely discharge if patient able to ambulate and feels safe going home  Clinical Course as of 07/03/24 1549  Sat Jul 03, 2024  1334 Workup is reassuring.  Discussed with the patient.  Her granddaughter is also on the phone.  All of her family lives in  [place] .  She has no one in [place] that can help her.  She does not feel safe going home given her fall today.  This seems reasonable.  Will see if social worker can help.  She will need to be a TOC border.  Will do medication reconciliation.  Will order some basic labs since she will be boarding here  [WS]  1543 Discussed with social work.  They were able to confirm that home health will be available today at 5 PM.  Since patient will have home health at 5 PM, I think it is safe for her to go home.  There is no medical indication for admission.  There is no option for her to go to SNF given insurance [WS]    Clinical Course User Index [WS] Francesca Elsie CROME, MD     Additional history obtained: -Additional history obtained from ems -External records from outside source obtained and reviewed including: Chart review including previous notes, labs, imaging, consultation notes including prior notes     Imaging Studies ordered: I ordered imaging studies including CT head, CT C spine, CT chest, CT L spine  On my interpretation imaging demonstrates no acute injury  I independently visualized and interpreted imaging. I agree with the radiologist interpretation   Medicines ordered and prescription drug management: Meds ordered this encounter  Medications   acetaminophen  (TYLENOL ) tablet 1,000 mg   oxyCODONE  (Oxy IR/ROXICODONE ) immediate release tablet 5 mg    Refill:  0   acetaminophen  (TYLENOL ) tablet 650 mg   loratadine  (CLARITIN ) tablet 10 mg   ipratropium (ATROVENT ) 0.06 % nasal spray 2 spray   alum & mag hydroxide-simeth (MAALOX/MYLANTA) 200-200-20 MG/5ML suspension 30 mL   apixaban  (ELIQUIS ) tablet 2.5 mg   empagliflozin  (JARDIANCE ) tablet 10 mg   pantoprazole  (PROTONIX ) EC tablet 40 mg   isosorbide  mononitrate (IMDUR ) 24 hr tablet 30 mg   nitroGLYCERIN  (NITROSTAT ) SL tablet 0.4 mg   megestrol  (MEGACE ) tablet 40 mg   mirtazapine  (REMERON ) tablet 15 mg   furosemide  (LASIX )  tablet 10 mg   polyethylene glycol (MIRALAX  / GLYCOLAX ) packet 17 g   senna-docusate (Senokot-S) tablet 1 tablet   cyanocobalamin  (VITAMIN B12) tablet 500 mcg   amiodarone  (PACERONE ) tablet 200 mg   metoprolol  tartrate (LOPRESSOR ) tablet 25 mg   sucralfate  (CARAFATE ) 1 GM/10ML suspension 1 g   multivitamin with minerals tablet 1 tablet    -I have reviewed the patients home medicines and have made adjustments as needed   Social Determinants of Health:  Diagnosis or treatment significantly limited by social determinants of health: lives alone   Reevaluation: After the interventions noted above, I reevaluated the patient and found that their symptoms have improved  Co morbidities that complicate the patient evaluation  Past Medical History:  Diagnosis Date   A-fib (HCC)    Allergy    CAD (coronary artery disease)    sees Dr. Debby Sor  cardiac stents - 2000   CHF (congestive heart failure) (HCC)    Colon polyps    Complication of anesthesia    rash/hives with caines   Dyspnea    02/12/18  when my heart gets out of rhythm, it has not been out of rhythym- since I have been on Tikosyn  (11/2017)   Dysrhythmia    afib fib   GERD (gastroesophageal  reflux disease)    takes OTC- Omeprazole - prn   Heart murmur    History of kidney stones    History of stress test    show normal perfusion without scar or ischemia, post EF 68%   Hx of echocardiogram    show an EF 55%-60% range with grade 1 diastolic dysfunction, she had mitral anular calcification with mild MR, moderate LA dilation and mild pulmonary hypertension with a PA estimated pressure of 39mm   Hyperlipidemia    Hypertension    NSTEMI (non-ST elevated myocardial infarction) (HCC)    Osteoarthritis    Pacemaker    Pneumonia    hx of 2015    PONV (postoperative nausea and vomiting)    Sleep apnea       Dispostion: Disposition decision including need for hospitalization was considered, and patient boarding for  discharge planning/TOC    Final Clinical Impression(s) / ED Diagnoses Final diagnoses:  Minor head injury, initial encounter     This chart was dictated using voice recognition software.  Despite best efforts to proofread,  errors can occur which can change the documentation meaning.    Francesca Elsie CROME, MD 07/03/24 1502    Francesca Elsie CROME, MD 07/03/24 [phone]

## 2024-07-03 NOTE — Discharge Instructions (Addendum)
 We evaluated you for your fall.  Your testing in the emergency department was reassuring.  Your home health agency will be home at 5 PM to help you.  Please be careful to avoid further falls.

## 2024-07-03 NOTE — Progress Notes (Signed)
 Orthopedic Tech Progress Note Patient Details:  Barbara [name] June 08, 1941 [id number]  Patient ID: Barbara [name], female   DOB: 04-19-1941, 83 y.o.   MRN: [id number] Checked in for level 2 trauma.  Morna Pink 07/03/2024, 10:42 AM

## 2024-07-03 NOTE — ED Notes (Signed)
[facility] transport called 

## 2024-07-03 NOTE — Progress Notes (Addendum)
 CSW received consult for possible SNF placement.  Per RN note, patient was recently discharged from [facility] after [facility] did not authorize any additional days at [facility].  CSW spoke with Dr. Francesca regarding patient. MD states patient lives alone and had a fall this morning.  CSW spoke with patient to discuss discharge plan. Patient states she needs more care at home. Patient states her son Sallee lives in [place]  and is unable to help. Patient states none of the family members on her facesheet are able to help her at home.  CSW spoke with patient's son Sallee to discuss patient. Jerod states patient was assessed by [facility] [employer] staff on Friday and services were supposed to start today at 9am and go through 12pm, and then again from 6pm through 9pm tonight. Jerod states he spoke with [facility] staff at 9:10am and was informed that services would not start today. Jerod states he informed Facilities manager that he wanted to find a more reliable agency to provide patient with services. Jerod states he has a list of providers that was given to him by [facility] that is attempting to contact to initiate services. Jerod states patient was discharged on 8/25 even though the appeal was pending - Sallee states he was notified on Wednesday 8/27 that the discharge was overturned and that patient could remain hospitalized but she was already at home.   CSW spoke with with April at [facility] who states she has a staff member scheduled to be at patient's home at 5pm today.   CSW informed MD of information and he is agreeable to have patient discharged home with [facility] staff to assist with care beginning at 5pm.  CSW informed RN and requested [facility] be called for transportation home.  CSW returned call to Jerod to inform him of discharge plan.  Niels Portugal, MSW, LCSW Transitions of Care  Clinical Social Worker II [phone]

## 2024-07-03 NOTE — Progress Notes (Signed)
 Chaplain responds to Level 2 fall on thinners and introduces self and services to pt,who is pleasant but frustrated by having fallen. She requests help shifting in bed and chaplain requests RN assistance. RN Milo responds and repositions pt for comfort. At pt request, chaplain wets washcloth so she can wipe her face as she fell before she was able to brush her teeth or wash her face. Pt denies further needs after NT gets her a pillow. Chaplain makes pt aware ongoing spiritual support is available.

## 2024-07-03 NOTE — ED Notes (Signed)
 Pt to CT

## 2024-07-03 NOTE — ED Triage Notes (Signed)
 Patient from home via EMS for eval of fall while attempting to get up off of the bedside commode. On Eliquis  and did hit head, small hematoma to posterior head. No LOC. Discharged from [facility] on Monday where she was completing rehab s/p hip fx, but family was attempting to appeal her discharge and have her receive more rehab. Has home health assistance M-F.

## 2024-07-06 ENCOUNTER — Telehealth: Payer: Self-pay

## 2024-07-06 ENCOUNTER — Telehealth: Payer: Self-pay | Admitting: Oncology

## 2024-07-06 ENCOUNTER — Inpatient Hospital Stay

## 2024-07-06 ENCOUNTER — Inpatient Hospital Stay: Admitting: Oncology

## 2024-07-06 NOTE — Telephone Encounter (Signed)
 Patient called and spoke with front desk staff to cancel today's appointment as she stated she just got out of the hospital and was not up to coming to today's appt. Staff Message sent to scheduler to reschedule.

## 2024-07-07 ENCOUNTER — Encounter: Payer: Self-pay | Admitting: Family Medicine

## 2024-07-07 ENCOUNTER — Telehealth: Payer: Self-pay

## 2024-07-07 ENCOUNTER — Ambulatory Visit: Admitting: Family Medicine

## 2024-07-07 VITALS — BP 110/58 | HR 60 | Temp 98.3°F | Wt 126.0 lb

## 2024-07-07 DIAGNOSIS — I480 Paroxysmal atrial fibrillation: Secondary | ICD-10-CM

## 2024-07-07 DIAGNOSIS — G4733 Obstructive sleep apnea (adult) (pediatric): Secondary | ICD-10-CM | POA: Diagnosis not present

## 2024-07-07 DIAGNOSIS — I10 Essential (primary) hypertension: Secondary | ICD-10-CM

## 2024-07-07 DIAGNOSIS — S0093XD Contusion of unspecified part of head, subsequent encounter: Secondary | ICD-10-CM

## 2024-07-07 DIAGNOSIS — N1832 Chronic kidney disease, stage 3b: Secondary | ICD-10-CM

## 2024-07-07 DIAGNOSIS — R627 Adult failure to thrive: Secondary | ICD-10-CM | POA: Diagnosis not present

## 2024-07-07 DIAGNOSIS — I5032 Chronic diastolic (congestive) heart failure: Secondary | ICD-10-CM

## 2024-07-07 NOTE — Telephone Encounter (Signed)
 Spoke with Almira with [facility] advised that VO requested have been approved, voiced understanding

## 2024-07-07 NOTE — Telephone Encounter (Signed)
 Copied from CRM #[id number]. Topic: Clinical - Home Health Verbal Orders >> Jul 02, 2024 11:38 AM Franky GRADE wrote: Caller/Agency: Almira / [facility]  Callback Number: [phone] Service Requested: Physical Therapy Frequency: 1 week 2, 2 week 3 and 1 week 1 Any new concerns about the patient? No

## 2024-07-07 NOTE — Progress Notes (Signed)
   Subjective:    Patient ID: Barbara [name], female    DOB: 07/02/1941, 83 y.o.   MRN: [id number]  HPI Here with her friend to follow up on a hospital stay from 06-20-24 to 06-28-24 for paroxysmal atrial fibrillation with RVR. We also spoke to her son Barbara [name] from [place] over the phone. On the day of admission she presented with chest pains, SOB, and palpitations. She was found to be in atrial fibrillation with a ventricular rate of 160. She was started on Amiodarone  and Metoprolol , and she spontaneously converted to sinus rhythm. She has remained in sinus rhythm since then. She was also started on Eliquis  for anticoagulation. Her labs were significant for creatinine 1.31, GFR 40, Hgb 10.8, and calcium  11.2. she was sent home with plans for PT and OT through home health. Then on 07-03-24 she was taken to the ED again after a fall at home. She lost her balance and fell backwards, striking the back of her head on the floor. There was no LOC. She had CT scans of the head, cervical spine and lumbar spine that were unremarkable. She has been stable since then. Prior to all this we have been treating her for failure to thrive, and we have been able to stimulate her appetite a bit with Megestrol . She is also drinking 1-2 Ensure shakes daily. Her weight has increased from 123 lbs to 126 lbs.    Review of Systems  Constitutional:  Positive for fatigue.  Respiratory: Negative.    Cardiovascular: Negative.   Gastrointestinal: Negative.   Genitourinary: Negative.   Neurological:  Positive for weakness. Negative for numbness and headaches.       Objective:   Physical Exam Constitutional:      Comments: In a wheelchair   HENT:     Head: Normocephalic and atraumatic.  Cardiovascular:     Rate and Rhythm: Normal rate and regular rhythm.     Pulses: Normal pulses.     Heart sounds: Normal heart sounds.  Pulmonary:     Effort: Pulmonary effort is normal.     Breath sounds: Normal breath sounds.   Musculoskeletal:     Comments: 1+ edema in both ankles   Neurological:     Mental Status: She is alert and oriented to person, place, and time.           Assessment & Plan:  She has recovered well from a recent fall and from an episode of atrial fibrillation with RVR. She is now in NSR. She will have an ECHO on 07-27-24 and she will see Cardiology on 08-02-24. Her failure to thrive has improved a tiny bit but she has a long way to go. Her hypercalcemia is stable. Her HTN and CKD and CHF are stable. She is getting PT and OT at home through [facility]. The family is trying to get some custodial care as well through [facility] [phone]). We will keep in contact with her son Barbara [name] in [place] with phone [phone] and email [name]. We spent a total of (35   ) minutes reviewing records and discussing these issues.  Barbara Olmsted, MD

## 2024-07-07 NOTE — Telephone Encounter (Signed)
 Please okay these orders  ?

## 2024-07-08 ENCOUNTER — Telehealth: Payer: Self-pay

## 2024-07-08 NOTE — Addendum Note (Signed)
 Addended by: LADONNA INOCENTE SAILOR on: 07/08/2024 12:17 PM   Modules accepted: Orders

## 2024-07-08 NOTE — Telephone Encounter (Signed)
 This all sounds good. Thanks

## 2024-07-08 NOTE — Telephone Encounter (Signed)
 Noted

## 2024-07-12 ENCOUNTER — Telehealth: Payer: Self-pay | Admitting: *Deleted

## 2024-07-12 NOTE — Telephone Encounter (Signed)
 Copied from CRM #[id number]. Topic: General - Other >> Jul 12, 2024  3:52 PM Barbara [name] wrote: Reason for CRM: Pt is calling to speak with Inocente in regards to the referral for PT. Pt stated that the insurance stated that she can get up to 35 hours of PT but Dr.Fry only listed 21 hours of PT. Pt stated that she would like for Dr.Fry to update the referral and put the Max hours for Pt and the insurance will decide how many hours of PT she needs. Pt would like a callback with an update.

## 2024-07-13 NOTE — Telephone Encounter (Signed)
 Spoke with pt son regarding the update on the moms custodial care, increase of hours for PT and Pulmonary referral for pt to have her  CPAP machine adjusted, he was very upset and yelling at me on the phone stating that it has been 6 months trying to get pt the care she needs, Referrals have been placed several times for Custodial care approved with [facility] care but pt son was not pleases with their care, requested to send another referral to [facility] care which went out to assess pt at home, spoke with pt insurance plan regarding hours approved but pt son states that Dr Johnny needs to add more hrs for custodial care since pt plan covers 20 hrs only. Tried to explain all we have done to help pt but he was very upset and would not listen to me. Notified Dr Johnny stated to let Crissy and Brittney.

## 2024-07-17 DIAGNOSIS — G4733 Obstructive sleep apnea (adult) (pediatric): Secondary | ICD-10-CM | POA: Diagnosis not present

## 2024-07-20 ENCOUNTER — Other Ambulatory Visit (HOSPITAL_COMMUNITY): Payer: Self-pay

## 2024-07-20 ENCOUNTER — Other Ambulatory Visit: Payer: Self-pay | Admitting: Family Medicine

## 2024-07-20 NOTE — Telephone Encounter (Unsigned)
 Copied from CRM [id number]. Topic: Clinical - Medication Refill >> Jul 20, 2024  5:08 PM Chiquita SQUIBB wrote: Medication:  amiodarone  amiodarone  (PACERONE ) 200 MG tablet  multivitamin with minerals Multiple Vitamin (MULTIVITAMIN WITH MINERALS) TABS tablet   metoprolol  tartrate metoprolol  tartrate (LOPRESSOR ) 25 MG tablet  Has the patient contacted their pharmacy? Yes (Agent: If no, request that the patient contact the pharmacy for the refill. If patient does not wish to contact the pharmacy document the reason why and proceed with request.) (Agent: If yes, when and what did the pharmacy advise?)  This is the patient's preferred pharmacy:  [facility] [address], [place] - [address] [place] [address] Phone: [phone] Fax: [phone]   Is this the correct pharmacy for this prescription? Yes If no, delete pharmacy and type the correct one.   Has the prescription been filled recently? No  Is the patient out of the medication? No  Has the patient been seen for an appointment in the last year OR does the patient have an upcoming appointment? Yes  Can we respond through MyChart? Yes  Agent: Please be advised that Rx refills may take up to 3 business days. We ask that you follow-up with your pharmacy.

## 2024-07-23 ENCOUNTER — Telehealth: Payer: Self-pay | Admitting: Family Medicine

## 2024-07-23 ENCOUNTER — Ambulatory Visit: Payer: Self-pay

## 2024-07-23 MED ORDER — METOPROLOL TARTRATE 25 MG PO TABS: 25.0000 mg | ORAL_TABLET | Freq: Two times a day (BID) | ORAL | 11 refills | Status: AC

## 2024-07-23 NOTE — Telephone Encounter (Signed)
 Error/njr

## 2024-07-23 NOTE — Telephone Encounter (Signed)
 Please see note re: continuation of Pacerone  (rx by ED) and vitamins. Unable to reach pt via phone x 3 attempts.

## 2024-07-23 NOTE — Telephone Encounter (Signed)
 Copied from CRM #[id number]. Topic: Referral - Status >> Jul 23, 2024  3:48 PM Dedra B wrote: Reason for CRM: [name] Peon, RN, case manager from [facility] calling to follow up on pt request for additional home health hours. Pls call [name] [phone]

## 2024-07-23 NOTE — Telephone Encounter (Signed)
 First attempt: LVM for patient to return call to [phone]  Copied from CRM #[id number]. Topic: Clinical - Medication Question >> Jul 23, 2024 10:09 AM Drema MATSU wrote: Reason for CRM: Patient wants to know if patient should continue Pacerone . She took medication while in the hospital for her heart. Patient also wants to know if pcp wants patient to take any vitamins if so what kind.

## 2024-07-23 NOTE — Telephone Encounter (Signed)
 Spoke with pt daughter- in-law Deetta Siegmann advised of Dr Johnny recommendation, voiced understanding

## 2024-07-23 NOTE — Telephone Encounter (Signed)
 Yes she should stay on this medication, but she needs to contact Cardiology to refill it

## 2024-07-26 ENCOUNTER — Encounter (HOSPITAL_BASED_OUTPATIENT_CLINIC_OR_DEPARTMENT_OTHER): Payer: Self-pay

## 2024-07-26 ENCOUNTER — Ambulatory Visit (HOSPITAL_BASED_OUTPATIENT_CLINIC_OR_DEPARTMENT_OTHER)

## 2024-07-26 VITALS — BP 151/74 | HR 68 | Ht 68.0 in | Wt 124.0 lb

## 2024-07-26 DIAGNOSIS — Z87891 Personal history of nicotine dependence: Secondary | ICD-10-CM | POA: Diagnosis not present

## 2024-07-26 DIAGNOSIS — G4733 Obstructive sleep apnea (adult) (pediatric): Secondary | ICD-10-CM | POA: Diagnosis not present

## 2024-07-26 NOTE — Progress Notes (Signed)
 @Patient  ID: Barbara [name], female    DOB: 1940-11-05, 83 y.o.   MRN: [id number]  Chief Complaint  Patient presents with   Establish Care    New sleep     Referring provider: Johnny Garnette LABOR, MD  HPI: Aliayah Tyer is an 83 y/o female with PMH of HTN, CAD, allergic rhinitis, PAF, chronic heart failure, GERD, and OSA who presents as a new patient evaluation for sleep.  She reports that she is not new to sleep apnea.  She had a sleep test completed in 2019 which showed an AHI of 52.2 with O2 sat nadir of 77%.  Best tested CPAP pressure was 11 cm H2O with a ResMed AirFit F20 size medium.  Since then she has transition to nasal pillows.  Today she reports that she is here because she is having issues with mask and water  in the mask.  A compliance download was reviewed and demonstrates that she has worn it 1 out of the last 30 days with a residual AHI just over 7.  Notably she does have an acceptable leak profile and she has been using it for about 6 hours when in use.  She does report benefit from the machine when she is not having issues with water  in the tubing.  She reports that she has tried it without using the humidifier but that dries out her mouth.  She has not attempted to adjust the humidity setting in the past.  TEST/EVENTS : Split-night study with AHI 52.2/h and O2 sat nadir 77% with best tested CPAP at 11 cm H2O.  Allergies  Allergen Reactions   Lidocaine  Anaphylaxis   Morphine  Other (See Comments)    Body shuts down   Procaine Hcl Anaphylaxis, Rash and Other (See Comments)    Anything with 'caine' in it    Sulfonamide Derivatives Hives   Amlodipine  Swelling   Ezetimibe-Simvastatin Other (See Comments)    VYTORIN = Joint pain   Latex Itching and Other (See Comments)    WELTS   Norco [Hydrocodone -Acetaminophen ] Nausea And Vomiting and Other (See Comments)    States does ok with IV form    Tape Itching and Other (See Comments)    Patient prefers either  paper tape or Coban wrap   Tramadol  Nausea And Vomiting    Immunization History  Administered Date(s) Administered   Fluad Quad(high Dose 65+) 08/04/2019, 10/31/2020, 08/27/2021   INFLUENZA, HIGH DOSE SEASONAL PF 08/02/2015, 09/04/2016, 08/14/2017, 08/03/2018   Influenza Split 10/25/2011, 10/21/2012   Influenza Whole 10/10/2009, 10/17/2010   Influenza,inj,Quad PF,6+ Mos 07/12/2013, 08/24/2014   Influenza-Unspecified 08/04/2014, 08/07/2015   PFIZER(Purple Top)SARS-COV-2 Vaccination 12/26/2019, 01/19/2020, 03/05/2021   Pneumococcal Conjugate-13 09/04/2015   Pneumococcal Polysaccharide-23 10/10/2009   Pneumococcal-Unspecified 08/07/2015   Tdap 11/06/2017    Past Medical History:  Diagnosis Date   A-fib [facility])    Allergy    CAD (coronary artery disease)    sees Dr. Debby Sor  cardiac stents - 2000   CHF (congestive heart failure) (HCC)    Colon polyps    Complication of anesthesia    rash/hives with caines   Dyspnea    02/12/18  when my heart gets out of rhythm, it has not been out of rhythym- since I have been on Tikosyn  (11/2017)   Dysrhythmia    afib fib   GERD (gastroesophageal reflux disease)    takes OTC- Omeprazole - prn   Heart murmur    History of kidney stones    History of  stress test    show normal perfusion without scar or ischemia, post EF 68%   Hx of echocardiogram    show an EF 55%-60% range with grade 1 diastolic dysfunction, she had mitral anular calcification with mild MR, moderate LA dilation and mild pulmonary hypertension with a PA estimated pressure of 39mm   Hyperlipidemia    Hypertension    NSTEMI (non-ST elevated myocardial infarction) (HCC)    Osteoarthritis    Pacemaker    Pneumonia    hx of 2015    PONV (postoperative nausea and vomiting)    Sleep apnea     Tobacco History: Social History   Tobacco Use  Smoking Status Former   Current packs/day: 0.00   Types: Cigarettes   Start date: 11/04/1956   Quit date: 11/04/1996   Years since  quitting: 27.7  Smokeless Tobacco Never   Counseling given: Not Answered   Outpatient Medications Prior to Visit  Medication Sig Dispense Refill   acetaminophen  (TYLENOL ) 325 MG tablet Take 2 tablets (650 mg total) by mouth every 6 (six) hours as needed for mild pain (pain score 1-3) or fever (or Fever >/= 101).     alum & mag hydroxide-simeth (MAALOX/MYLANTA) 200-200-20 MG/5ML suspension Take 30 mLs by mouth every 4 (four) hours as needed for indigestion. 355 mL 0   amiodarone  (PACERONE ) 200 MG tablet Take 1 tablet (200 mg total) by mouth 2 (two) times daily. 60 tablet 0   apixaban  (ELIQUIS ) 2.5 MG TABS tablet Take 1 tablet (2.5 mg total) by mouth 2 (two) times daily. 180 tablet 3   cyanocobalamin  (VITAMIN B12) 500 MCG tablet Take 500 mcg by mouth daily.     diclofenac  Sodium (VOLTAREN ) 1 % GEL Apply 2 g topically 4 (four) times daily for 7 days, THEN 2 g 4 (four) times daily as needed. Apply to right lower ribs. (Patient taking differently: Apply 2 g 4 (four) times daily as needed. Apply to right lower ribs.)     empagliflozin  (JARDIANCE ) 10 MG TABS tablet Take 1 tablet (10 mg total) by mouth daily. 90 tablet 3   furosemide  (LASIX ) 20 MG tablet Take 10 mg by mouth daily as needed (for swelling of the ankles).     ipratropium (ATROVENT ) 0.06 % nasal spray Place 2 sprays into both nostrils 2 (two) times daily as needed (allergies).  5   isosorbide  mononitrate (IMDUR ) 30 MG 24 hr tablet Take 1 tablet (30 mg total) by mouth daily. 90 tablet 3   ketoconazole  (NIZORAL ) 2 % cream Apply 1 Application topically 2 (two) times daily as needed for irritation. 30 g 5   loratadine  (CLARITIN ) 10 MG tablet Take 10 mg by mouth daily as needed for allergies.     megestrol  (MEGACE ) 40 MG tablet Take 1 tablet (40 mg total) by mouth in the morning. 30 tablet 5   metoprolol  tartrate (LOPRESSOR ) 25 MG tablet Take 1 tablet (25 mg total) by mouth 2 (two) times daily. 60 tablet 11   mirtazapine  (REMERON ) 15 MG tablet  Take 1 tablet (15 mg total) by mouth at bedtime. 30 tablet 5   Multiple Vitamin (MULTIVITAMIN WITH MINERALS) TABS tablet Take 1 tablet by mouth daily. 30 tablet 0   nitroGLYCERIN  (NITROSTAT ) 0.4 MG SL tablet Place 1 tablet (0.4 mg total) under the tongue every 5 (five) minutes as needed for chest pain. 60 tablet 5   Nutritional Supplements (BOOST/FIBER PO) Take 1 Bottle by mouth 3 (three) times daily.     pantoprazole  (  PROTONIX ) 40 MG tablet Take 1 tablet (40 mg total) by mouth daily. 90 tablet 3   polyethylene glycol (MIRALAX  / GLYCOLAX ) 17 g packet Take 17 g by mouth 2 (two) times daily. (Patient taking differently: Take 17 g by mouth daily as needed for mild constipation.)     senna-docusate (SENOKOT-S) 8.6-50 MG tablet Take 1 tablet by mouth 2 (two) times daily.     sucralfate  (CARAFATE ) 1 GM/10ML suspension Take 10 mLs (1 g total) by mouth 2 (two) times daily. 420 mL 0   triamcinolone  (NASACORT) 55 MCG/ACT nasal inhaler Place 1 spray into both nostrils daily as needed (for allergies).      No facility-administered medications prior to visit.     Review of Systems:   Constitutional:   No  weight loss, night sweats,  Fevers, chills, fatigue, or  lassitude.  HEENT:   No headaches,  Difficulty swallowing,  Tooth/dental problems, or  Sore throat,                No sneezing, itching, ear ache, nasal congestion, post nasal drip,   CV:  No chest pain,  Orthopnea, PND, swelling in lower extremities, anasarca, dizziness, palpitations, syncope.   GI  No heartburn, indigestion, abdominal pain, nausea, vomiting, diarrhea, change in bowel habits, loss of appetite, bloody stools.   Resp: No shortness of breath with exertion or at rest.  No excess mucus, no productive cough,  No non-productive cough,  No coughing up of blood.  No change in color of mucus.  No wheezing.  No chest wall deformity  Skin: no rash or lesions.  GU: no dysuria, change in color of urine, no urgency or frequency.  No flank  pain, no hematuria   MS:  No joint pain or swelling.  No decreased range of motion.  No back pain.    Physical Exam  BP (!) 151/74   Pulse 68   Ht 5' 8 (1.727 m)   Wt 124 lb (56.2 kg)   SpO2 99%   BMI 18.85 kg/m   GEN: A/Ox3; pleasant , NAD, well nourished sitting upright in a wheelchair with family at the bedside.   HEENT:  [place]/AT,  EACs-clear, TMs-wnl, NOSE-clear, THROAT-clear, no lesions, no postnasal drip or exudate noted.   NECK:  Supple w/ fair ROM; no JVD; normal carotid impulses w/o bruits; no thyromegaly or nodules palpated; no lymphadenopathy.    RESP  Clear  P & A; w/o, wheezes/ rales/ or rhonchi. no accessory muscle use, no dullness to percussion  CARD:  RRR, no m/r/g, no peripheral edema, pulses intact, no cyanosis or clubbing.  GI:   Soft & nt; nml bowel sounds; no organomegaly or masses detected.   Musco: Warm bil, no deformities or joint swelling noted.   Neuro: alert, no focal deficits noted.    Skin: Warm, no lesions or rashes    Lab Results:  CBC    Component Value Date/Time   WBC 4.0 07/03/2024 1330   RBC 3.90 07/03/2024 1330   HGB 10.8 (L) 07/03/2024 1330   HGB 11.1 04/26/2024 1240   HCT 34.9 (L) 07/03/2024 1330   HCT 36.2 04/26/2024 1240   PLT 210 07/03/2024 1330   PLT 138 (L) 04/26/2024 1240   MCV 89.5 07/03/2024 1330   MCV 89 04/26/2024 1240   MCH 27.7 07/03/2024 1330   MCHC 30.9 07/03/2024 1330   RDW 14.5 07/03/2024 1330   RDW 18.7 (H) 04/26/2024 1240   LYMPHSABS 0.7 07/03/2024 1330  LYMPHSABS 0.5 (L) 04/26/2024 1240   MONOABS 0.2 07/03/2024 1330   EOSABS 0.1 07/03/2024 1330   EOSABS 0.1 04/26/2024 1240   BASOSABS 0.0 07/03/2024 1330   BASOSABS 0.0 04/26/2024 1240    BMET    Component Value Date/Time   NA 139 07/03/2024 1330   NA 139 04/26/2024 1240   K 3.5 07/03/2024 1330   CL 106 07/03/2024 1330   CO2 24 07/03/2024 1330   GLUCOSE 87 07/03/2024 1330   BUN 30 (H) 07/03/2024 1330   BUN 28 (H) 04/26/2024 1240    CREATININE 1.31 (H) 07/03/2024 1330   CREATININE 1.30 (H) 06/18/2024 1545   CALCIUM  10.8 (H) 07/03/2024 1330   GFRNONAA 40 (L) 07/03/2024 1330   GFRNONAA 35 (L) 04/05/2024 1032   GFRAA 44 (L) 09/20/2020 1453    BNP    Component Value Date/Time   BNP 980.3 (H) 06/20/2024 1538    ProBNP No results found for: PROBNP  Imaging: CT Lumbar Spine Wo Contrast Result Date: 07/03/2024 EXAM: CT OF THE LUMBAR SPINE WITHOUT CONTRAST 07/03/2024 12:55:00 PM TECHNIQUE: CT of the lumbar spine was performed without the administration of intravenous contrast. Multiplanar reformatted images are provided for review. Automated exposure control, iterative reconstruction, and/or weight based adjustment of the mA/kV was utilized to reduce the radiation dose to as low as reasonably achievable. COMPARISON: 03/24/2024 CLINICAL HISTORY: Back trauma, no prior imaging (Age >= 16y). Chest and back trauma from fall earlier today. FINDINGS: BONES AND ALIGNMENT: Normal vertebral body heights. No acute fracture or suspicious bone lesion. Normal alignment. DEGENERATIVE CHANGES: T11-12: Disc narrowing with vacuum phenomenon, anterior endplate spurring. L3-4: Mild disc narrowing with vacuum phenomenon. Bilateral facet degenerative joint disease (DJD). L4-5: Moderate disc narrowing with vacuum phenomenon and circumferential endplate spurring. Bilateral facet DJD. L5-S1: Advanced bilateral facet DJD allowing grade 1 anterolisthesis. Narrowing of the interspace with vacuum phenomenon. SOFT TISSUES: No paraspinal hematoma. VASCULATURE: Scattered calcified aortoiliac plaque. KIDNEYS, URETERS, AND BLADDER: Multiple cortical lesions in kidneys, some of which can be characterized as simple cysts, largest 3.8 cm 9 HU from the right lower pole. IMPRESSION: 1. No evidence of acute traumatic injury. 2. Degenerative changes at T11-12, L3-4, L4-5, and L5-S1 as described above. Electronically signed by: Katheleen Faes MD 07/03/2024 01:07 PM EDT RP  Workstation: [id number]   CT Chest Wo Contrast Result Date: 07/03/2024 EXAM: CT CHEST WITHOUT CONTRAST 07/03/2024 12:55:00 PM TECHNIQUE: CT of the chest was performed without the administration of intravenous contrast. Multiplanar reformatted images are provided for review. Automated exposure control, iterative reconstruction, and/or weight based adjustment of the mA/kV was utilized to reduce the radiation dose to as low as reasonably achievable. COMPARISON: 06/20/2024 CLINICAL HISTORY: Chest trauma, blunt. Chest and back trauma from fall earlier today. FINDINGS: MEDIASTINUM AND LYMPH NODES: Left atrial enlargement. Mild left ventricular dilatation. Scattered coronary calcifications. Scattered aortic valve leaflet calcifications. Minimal calcified plaque in the aortic arch and distal descending thoracic aorta. Right subclavian transvenous pacing leads. Mildly dilated central pulmonary arteries. LUNGS AND PLEURA: Dependent atelectasis/scarring posteriorly in both lung bases. Pleuroparenchymal scarring posterolaterally in both upper lobes as before. SOFT TISSUES/BONES: Vertebral endplate spurring at multiple levels in the mid and lower thoracic spine. Right shoulder arthroplasty. UPPER ABDOMEN: Multiple cortical lesions in kidneys, some of which can be characterized as simple cysts, largest at least 3.7 cm, right mid kidney, incompletely visualized. IMPRESSION: 1. No acute findings. Electronically signed by: Katheleen Faes MD 07/03/2024 01:03 PM EDT RP Workstation: [id number]   CT Head Wo Contrast  Result Date: 07/03/2024 EXAM: CT HEAD AND CERVICAL SPINE 07/03/2024 11:07:45 AM TECHNIQUE: CT of the head and cervical spine was performed without the administration of intravenous contrast. Multiplanar reformatted images are provided for review. Automated exposure control, iterative reconstruction, and/or weight based adjustment of the mA/kV was utilized to reduce the radiation dose to as low as reasonably achievable.  COMPARISON: 05/08/2024 CLINICAL HISTORY: Headache, new onset (Age >= 51y). Patient from home via EMS for eval of fall while attempting to get up off of the bedside commode. On Eliquis  and did hit head, small hematoma to posterior head. FINDINGS: CT HEAD BRAIN AND VENTRICLES: No acute intracranial hemorrhage. No mass effect or midline shift. No abnormal extra-axial fluid collection. No evidence of acute infarct. No hydrocephalus. ORBITS: No acute abnormality. SINUSES AND MASTOIDS: Bubbly opacities identified within the right posterior ethmoid air cells. SOFT TISSUES AND SKULL: No acute skull fracture. No acute soft tissue abnormality. CT CERVICAL SPINE BONES AND ALIGNMENT: No acute fracture or subluxation. Normal alignment of the cervical spine. DEGENERATIVE CHANGES: Multilevel disc space narrowing and endplate spurring compatible with cervical spondylosis. SOFT TISSUES: No prevertebral soft tissue swelling. IMPRESSION: 1. No acute intracranial abnormality. 2. No acute fracture or traumatic malalignment of the cervical spine. 3. Mild ethmoid sinus disease. 4. Cervical degenerative disc disease. Electronically signed by: Waddell Calk MD 07/03/2024 11:27 AM EDT RP Workstation: [id number]   CT Cervical Spine Wo Contrast Result Date: 07/03/2024 EXAM: CT HEAD AND CERVICAL SPINE 07/03/2024 11:07:45 AM TECHNIQUE: CT of the head and cervical spine was performed without the administration of intravenous contrast. Multiplanar reformatted images are provided for review. Automated exposure control, iterative reconstruction, and/or weight based adjustment of the mA/kV was utilized to reduce the radiation dose to as low as reasonably achievable. COMPARISON: 05/08/2024 CLINICAL HISTORY: Headache, new onset (Age >= 49y). Patient from home via EMS for eval of fall while attempting to get up off of the bedside commode. On Eliquis  and did hit head, small hematoma to posterior head. FINDINGS: CT HEAD BRAIN AND VENTRICLES: No acute  intracranial hemorrhage. No mass effect or midline shift. No abnormal extra-axial fluid collection. No evidence of acute infarct. No hydrocephalus. ORBITS: No acute abnormality. SINUSES AND MASTOIDS: Bubbly opacities identified within the right posterior ethmoid air cells. SOFT TISSUES AND SKULL: No acute skull fracture. No acute soft tissue abnormality. CT CERVICAL SPINE BONES AND ALIGNMENT: No acute fracture or subluxation. Normal alignment of the cervical spine. DEGENERATIVE CHANGES: Multilevel disc space narrowing and endplate spurring compatible with cervical spondylosis. SOFT TISSUES: No prevertebral soft tissue swelling. IMPRESSION: 1. No acute intracranial abnormality. 2. No acute fracture or traumatic malalignment of the cervical spine. 3. Mild ethmoid sinus disease. 4. Cervical degenerative disc disease. Electronically signed by: Waddell Calk MD 07/03/2024 11:27 AM EDT RP Workstation: [id number]    Administration History     None           No data to display          No results found for: NITRICOXIDE   Assessment & Plan:   Assessment & Plan OSA (obstructive sleep apnea) Plan to make adjustments to her humidity setting and turn this down; it seems as though she does need some humidity but just not the highest setting. She was also agreeable to replacing her current CPAP supplies including the mask and tubing; she states that she has new supplies at home. We also discussed compliance with the machine and the risks of untreated sleep apnea.  Informational packet was  attached to her AVS for review. Plan for follow-up in 3 months to review compliance download.  Compliance encouraged.   Return in about 3 months (around 10/25/2024) for compliance download review.  Candis Dandy, PA-C 07/26/2024

## 2024-07-26 NOTE — Patient Instructions (Signed)
 Resume humidification at lower level.  Orders placed.  Follow up in 3 months for compliance download.  Return to clinic sooner if new or worsening symptoms.

## 2024-07-26 NOTE — Assessment & Plan Note (Signed)
 Plan to make adjustments to her humidity setting and turn this down; it seems as though she does need some humidity but just not the highest setting. She was also agreeable to replacing her current CPAP supplies including the mask and tubing; she states that she has new supplies at home. We also discussed compliance with the machine and the risks of untreated sleep apnea.  Informational packet was attached to her AVS for review. Plan for follow-up in 3 months to review compliance download.  Compliance encouraged.

## 2024-07-26 NOTE — Telephone Encounter (Signed)
 Please arrange for as many hours as they can give her

## 2024-07-26 NOTE — Telephone Encounter (Signed)
 Left detailed message for [name] with [facility], advised to return mu call in the office regarding pt

## 2024-07-27 ENCOUNTER — Encounter: Payer: Self-pay | Admitting: Podiatry

## 2024-07-27 ENCOUNTER — Ambulatory Visit (INDEPENDENT_AMBULATORY_CARE_PROVIDER_SITE_OTHER): Admitting: Podiatry

## 2024-07-27 ENCOUNTER — Ambulatory Visit (HOSPITAL_COMMUNITY)
Admission: RE | Admit: 2024-07-27 | Discharge: 2024-07-27 | Disposition: A | Discharge status: Home / Self Care | Source: Ambulatory Visit | Attending: Internal Medicine | Admitting: Internal Medicine

## 2024-07-27 DIAGNOSIS — I2510 Atherosclerotic heart disease of native coronary artery without angina pectoris: Secondary | ICD-10-CM | POA: Diagnosis not present

## 2024-07-27 DIAGNOSIS — B351 Tinea unguium: Secondary | ICD-10-CM

## 2024-07-27 DIAGNOSIS — M79675 Pain in left toe(s): Secondary | ICD-10-CM

## 2024-07-27 DIAGNOSIS — I340 Nonrheumatic mitral (valve) insufficiency: Secondary | ICD-10-CM | POA: Diagnosis not present

## 2024-07-27 DIAGNOSIS — M79674 Pain in right toe(s): Secondary | ICD-10-CM | POA: Diagnosis not present

## 2024-07-27 LAB — ECHOCARDIOGRAM COMPLETE
Area-P 1/2: 3.6 cm2
S' Lateral: 2 cm

## 2024-07-27 MED ORDER — PERFLUTREN LIPID MICROSPHERE
2.0000 mL | INTRAVENOUS | Status: AC | PRN
  Administered 2024-07-27: 2 mL via INTRAVENOUS

## 2024-07-27 NOTE — Telephone Encounter (Signed)
 FYI Spoke with Jacqueline with [facility] regarding Dr Johnny advise to increase the hours for pt care, agent stated that she spoke with pt son who requested to have Dr Johnny approve additional hours to pt care. [name] stated that she will call [facility] benefits department to see if pt hours for care can be increased, stated that she will update pt son on the outcome. Agent also mentioned that she has advised pt son to apply for a nursing facility where pt will receive appropriate care round the clock. Dr Johnny was notified of this conversation.

## 2024-07-27 NOTE — Progress Notes (Unsigned)
  Subjective:  Patient ID: Barbara [name], female    DOB: 02/05/1941,  MRN: [id number]  Barbara [name] presents to clinic today for painful thick toenails that are difficult to trim. Pain interferes with ambulation. Aggravating factors include wearing enclosed shoe gear. Pain is relieved with periodic professional debridement. She is accompanied by family member on today's visit. Patient had a fall with hospitalization since our last visit. Chief Complaint  Patient presents with   RFC     RFC Non dabetic toenail trim. LOV with PCP 07/07/24   New problem(s): None.   PCP is Johnny Garnette LABOR, MD.  Allergies  Allergen Reactions   Lidocaine  Anaphylaxis   Morphine  Other (See Comments)    Body shuts down   Procaine Hcl Anaphylaxis, Rash and Other (See Comments)    Anything with 'caine' in it    Sulfonamide Derivatives Hives   Amlodipine  Swelling   Ezetimibe-Simvastatin Other (See Comments)    VYTORIN = Joint pain   Latex Itching and Other (See Comments)    WELTS   Norco [Hydrocodone -Acetaminophen ] Nausea And Vomiting and Other (See Comments)    States does ok with IV form    Tape Itching and Other (See Comments)    Patient prefers either paper tape or Coban wrap   Tramadol  Nausea And Vomiting    Review of Systems: Negative except as noted in the HPI.  Objective: No changes noted in today's physical examination. There were no vitals filed for this visit. Barbara [name] is a pleasant 83 y.o. female WD, WN in NAD. AAO x 3.  Vascular Examination: Capillary refill time <3 seconds b/l LE. Palpable pedal pulses b/l LE. Digital hair present b/l. No pedal edema b/l. Skin temperature gradient WNL b/l. No varicosities b/l. No cyanosis or clubbing noted b/l LE.SABRA  Dermatological Examination: Pedal skin with normal turgor, texture and tone b/l. No open wounds. No interdigital macerations b/l. Toenails 1-5 b/l thickened, discolored, dystrophic with subungual debris. There  is pain on palpation to dorsal aspect of nailplates. No corns, calluses nor porokeratotic lesions noted..  Neurological Examination: Protective sensation intact with 10 gram monofilament b/l LE. Vibratory sensation intact b/l LE.   Musculoskeletal Examination: Muscle strength 5/5 to all lower extremity muscle groups bilaterally. No pain, crepitus or joint limitation noted with ROM bilateral LE. No gross bony deformities bilaterally. Utilizes cane for ambulation assistance.  Assessment/Plan: 1. Pain due to onychomycosis of toenails of both feet   Consent given for treatment. Patient examined. All patient's and/or POA's questions/concerns addressed on today's visit. Mycotic toenails 1-5 debrided in length and girth without incident. Continue soft, supportive shoe gear daily. Report any pedal injuries to medical professional. Call office if there are any quesitons/concerns. -Patient/POA to call should there be question/concern in the interim.   Return in about 3 months (around 10/26/2024).  Delon LITTIE Merlin, DPM      [facility] LOCATION: [address] [place] [address]                   Office [phone]   [facility] LOCATION: [address] [place], [place] [address] Office [phone]

## 2024-07-27 NOTE — Telephone Encounter (Signed)
 Attempted to call back this morning no success to speak with agent, will try again later

## 2024-07-28 ENCOUNTER — Telehealth: Payer: Self-pay | Admitting: Cardiovascular Disease

## 2024-07-28 MED ORDER — AMIODARONE HCL 200 MG PO TABS: 200.0000 mg | ORAL_TABLET | Freq: Two times a day (BID) | ORAL | 2 refills | Status: DC

## 2024-07-28 NOTE — Telephone Encounter (Signed)
*  STAT* If patient is at the pharmacy, call can be transferred to refill team.   1. Which medications need to be refilled? (please list name of each medication and dose if known) amiodarone  (PACERONE ) 200 MG tablet    2. Would you like to learn more about the convenience, safety, & potential cost savings by using the [facility]?      3. Are you open to using the [facility] (Type [facility].  ).   4. Which pharmacy/location (including street and city if local pharmacy) is medication to be sent to? [facility] - [place], [place] - [address]    5. Do they need a 30 day or 90 day supply? 30 day

## 2024-07-28 NOTE — Telephone Encounter (Signed)
 RX sent in

## 2024-07-30 ENCOUNTER — Telehealth: Payer: Self-pay

## 2024-07-30 NOTE — Telephone Encounter (Signed)
 Left detailed message for Barbara [name] with Dr Johnny advise regarding caring for pt blister

## 2024-07-30 NOTE — Telephone Encounter (Signed)
 Tell them to apply a thin layer of Vaseline and cover with a Bandaid

## 2024-07-30 NOTE — Telephone Encounter (Signed)
 Copied from CRM [id number]. Topic: Clinical - Medical Advice >> Jul 29, 2024  2:47 PM Chiquita SQUIBB wrote: Reason for CRM: Jenna from [facility] is calling in stating she saw the patient today and she had a blister that had popped on her left upper inner thin. Jenna is asking if the doctor would like them to put anything on it or do anything with it. Please advise Jenna at [phone].

## 2024-08-01 NOTE — Progress Notes (Unsigned)
 Cardiology Office Note:    Date:  08/03/2024   ID:  Barbara [name], DOB Oct 21, 1941, MRN [id number]  PCP:  Johnny Garnette LABOR, MD  Cardiologist:  Annabella Scarce, MD  Electrophysiologist:  Soyla Gladis Norton, MD   Referring MD: Johnny Garnette LABOR, MD   Chief Complaint  Patient presents with   Coronary Artery Disease    History of Present Illness:    Barbara [name] is a 83 y.o. female with a hx of CAD, apical HCM, OSA, SSS status post PPM, atrial fibrillation, CKD hypertension, hyperlipidemia who presents for follow-up.  She previously followed with Dr. Burnard.  Underwent PCI to LAD with BMS in 2000.  Most recent cath in 05/2022 showed patent LAD stent with 30% in-stent restenosis and mild nonobstructive CAD.  CMR 05/2020 showed apical hypertrophy measuring up to 15 mm, consistent with apical HCM, patchy LGE at apex and RV insertion site (2% of total myocardial mass), normal biventricular size and hyperdynamic systolic function.  Echocardiogram 05/2022 showed EF greater than 75% normal RV function, moderate mitral regurgitation.  Echocardiogram 07/2024 showed EF 70 to 75%, severe apical LVH, G2DD, normal RV function, moderate left atrial enlargement.  Since last clinic visit, she reports she is doing okay.  Does report some indigestion.  She has been working with physical therapy, reports some shortness of breath with this but denies any chest pain.  She has not been using her CPAP.  She denies any bleeding on Eliquis .  Past Medical History:  Diagnosis Date   A-fib [facility])    Allergy    CAD (coronary artery disease)    sees Dr. Debby Burnard  cardiac stents - 2000   CHF (congestive heart failure) (HCC)    Colon polyps    Complication of anesthesia    rash/hives with caines   Dyspnea    02/12/18  when my heart gets out of rhythm, it has not been out of rhythym- since I have been on Tikosyn  (11/2017)   Dysrhythmia    afib fib   GERD (gastroesophageal reflux disease)    takes OTC-  Omeprazole - prn   Heart murmur    History of kidney stones    History of stress test    show normal perfusion without scar or ischemia, post EF 68%   Hx of echocardiogram    show an EF 55%-60% range with grade 1 diastolic dysfunction, she had mitral anular calcification with mild MR, moderate LA dilation and mild pulmonary hypertension with a PA estimated pressure of 39mm   Hyperlipidemia    Hypertension    NSTEMI (non-ST elevated myocardial infarction) (HCC)    Osteoarthritis    Pacemaker    Pneumonia    hx of 2015    PONV (postoperative nausea and vomiting)    Sleep apnea     Past Surgical History:  Procedure Laterality Date   CARDIAC CATHETERIZATION     11/2017   cardiac stents  2000   COLONOSCOPY  01-05-14   per Dr. Norleen Hint, clear, no repeats needed    COLONOSCOPY     CORONARY STENT PLACEMENT  2000   in LAD   CYSTOSCOPY WITH RETROGRADE PYELOGRAM, URETEROSCOPY AND STENT PLACEMENT Left 06/11/2022   Procedure: CYSTOSCOPY WITH RETROGRADE PYELOGRAM, LEFT STENT PLACEMENT;  Surgeon: Carolee Sherwood JONETTA DOUGLAS, MD;  Location: [facility];  Service: Urology;  Laterality: Left;   CYSTOSCOPY/URETEROSCOPY/HOLMIUM LASER/STENT PLACEMENT Left 06/25/2022   Procedure: CYSTOSCOPY LEFT URETEROSCOPY/HOLMIUM LASER/STENT PLACEMENT;  Surgeon: Watt Norleen, MD;  Location: [facility];  Service: Urology;  Laterality: Left;  1 HR FOR THIS CASE   DIRECT LARYNGOSCOPY WITH RADIAESSE INJECTION N/A 02/13/2018   Procedure: DIRECT LARYNGOSCOPY WITH RADIAESSE INJECTION;  Surgeon: Carlie Clark, MD;  Location: [facility] OR;  Service: ENT;  Laterality: N/A;   ESOPHAGOGASTRODUODENOSCOPY N/A 03/11/2024   Procedure: EGD (ESOPHAGOGASTRODUODENOSCOPY);  Surgeon: Rosalie Kitchens, MD;  Location: [facility] ENDOSCOPY;  Service: Gastroenterology;  Laterality: N/A;   EYE SURGERY Left    CATARACT REMOVAL   HIP ARTHROPLASTY Left 09/27/2023   Procedure: ARTHROPLASTY BIPOLAR HIP (HEMIARTHROPLASTY);  Surgeon: Beverley Evalene BIRCH, MD;  Location: [facility] OR;  Service:  Orthopedics;  Laterality: Left;   KNEE ARTHROSCOPY Left 01/06/2015   Procedure: LEFT KNEE ARTHROSCOPY, abrasion chondroplasty of the medial femerol condryl,medial and lateral menisectomy, microfracture , synovectomy of the suprpatellar pouch;  Surgeon: Tanda Heading, MD;  Location: [facility];  Service: Orthopedics;  Laterality: Left;   LEFT HEART CATH AND CORONARY ANGIOGRAPHY N/A 02/26/2019   Procedure: LEFT HEART CATH AND CORONARY ANGIOGRAPHY;  Surgeon: Burnard Debby LABOR, MD;  Location: [facility] IN[facility] LAB;  Service: Cardiovascular;  Laterality: N/A;   LEFT HEART CATH AND CORONARY ANGIOGRAPHY N/A 05/06/2022   Procedure: LEFT HEART CATH AND CORONARY ANGIOGRAPHY;  Surgeon: Mady Bruckner, MD;  Location: [facility];  Service: Cardiovascular;  Laterality: N/A;   MICROLARYNGOSCOPY W/VOCAL CORD INJECTION N/A 08/07/2018   Procedure: MICROLARYNGOSCOPY WITH VOCAL CORD INJECTION OF PROLARYN;  Surgeon: Carlie Clark, MD;  Location: [facility] OR;  Service: ENT;  Laterality: N/A;  JET VENTILATION   PACEMAKER IMPLANT N/A 05/08/2022   Procedure: PACEMAKER IMPLANT;  Surgeon: Inocencio Soyla Lunger, MD;  Location: [facility];  Service: Cardiovascular;  Laterality: N/A;   REVERSE SHOULDER ARTHROPLASTY Right 03/23/2020   Procedure: REVERSE SHOULDER ARTHROPLASTY;  Surgeon: Sharl Selinda Dover, MD;  Location: [facility] OR;  Service: Orthopedics;  Laterality: Right;   VAGINAL HYSTERECTOMY  1971    Current Medications: Current Meds  Medication Sig   acetaminophen  (TYLENOL ) 325 MG tablet Take 2 tablets (650 mg total) by mouth every 6 (six) hours as needed for mild pain (pain score 1-3) or fever (or Fever >/= 101).   alum & mag hydroxide-simeth (MAALOX/MYLANTA) 200-200-20 MG/5ML suspension Take 30 mLs by mouth every 4 (four) hours as needed for indigestion.   apixaban  (ELIQUIS ) 2.5 MG TABS tablet Take 1 tablet (2.5 mg total) by mouth 2 (two) times daily.   cyanocobalamin  (VITAMIN B12) 500 MCG tablet Take 500 mcg by mouth daily.    empagliflozin  (JARDIANCE ) 10 MG TABS tablet Take 1 tablet (10 mg total) by mouth daily.   furosemide  (LASIX ) 20 MG tablet Take 10 mg by mouth daily as needed (for swelling of the ankles).   ipratropium (ATROVENT ) 0.06 % nasal spray Place 2 sprays into both nostrils 2 (two) times daily as needed (allergies).   isosorbide  mononitrate (IMDUR ) 30 MG 24 hr tablet Take 1 tablet (30 mg total) by mouth daily.   ketoconazole  (NIZORAL ) 2 % cream Apply 1 Application topically 2 (two) times daily as needed for irritation.   loratadine  (CLARITIN ) 10 MG tablet Take 10 mg by mouth daily as needed for allergies.   megestrol  (MEGACE ) 40 MG tablet Take 1 tablet (40 mg total) by mouth in the morning.   mirtazapine  (REMERON ) 15 MG tablet Take 1 tablet (15 mg total) by mouth at bedtime.   Multiple Vitamin (MULTIVITAMIN WITH MINERALS) TABS tablet Take 1 tablet by mouth daily.   nitroGLYCERIN  (NITROSTAT ) 0.4 MG SL tablet Place  1 tablet (0.4 mg total) under the tongue every 5 (five) minutes as needed for chest pain.   Nutritional Supplements (BOOST/FIBER PO) Take 1 Bottle by mouth 3 (three) times daily.   pantoprazole  (PROTONIX ) 40 MG tablet Take 1 tablet (40 mg total) by mouth daily.   polyethylene glycol (MIRALAX  / GLYCOLAX ) 17 g packet Take 17 g by mouth 2 (two) times daily.   senna-docusate (SENOKOT-S) 8.6-50 MG tablet Take 1 tablet by mouth 2 (two) times daily.   sucralfate  (CARAFATE ) 1 GM/10ML suspension Take 10 mLs (1 g total) by mouth 2 (two) times daily.   triamcinolone  (NASACORT) 55 MCG/ACT nasal inhaler Place 1 spray into both nostrils daily as needed (for allergies).    [DISCONTINUED] amiodarone  (PACERONE ) 200 MG tablet Take 1 tablet (200 mg total) by mouth 2 (two) times daily.     Allergies:   Lidocaine , Morphine , Procaine hcl, Sulfonamide derivatives, Amlodipine , Ezetimibe-simvastatin, Latex, Norco [hydrocodone -acetaminophen ], Tape, and Tramadol    Social History   Socioeconomic History   Marital  status: Widowed    Spouse name: Not on file   Number of children: 2   Years of education: college   Highest education level: High school graduate  Occupational History   Occupation: Retired  Tobacco Use   Smoking status: Former    Current packs/day: 0.00    Types: Cigarettes    Start date: 11/04/1956    Quit date: 11/04/1996    Years since quitting: 27.7   Smokeless tobacco: Never  Vaping Use   Vaping status: Never Used  Substance and Sexual Activity   Alcohol use: No    Alcohol/week: 0.0 standard drinks of alcohol   Drug use: No   Sexual activity: Not Currently  Other Topics Concern   Not on file  Social History Narrative   Not on file   Social Drivers of Health   Financial Resource Strain: Low Risk  (08/05/2022)   Overall Financial Resource Strain (CARDIA)    Difficulty of Paying Living Expenses: Not hard at all  Food Insecurity: No Food Insecurity (06/20/2024)   Hunger Vital Sign    Worried About Running Out of Food in the Last Year: Never true    Ran Out of Food in the Last Year: Never true  Transportation Needs: Unmet Transportation Needs (06/20/2024)   PRAPARE - Transportation    Lack of Transportation (Medical): Yes    Lack of Transportation (Non-Medical): Yes  Physical Activity: Insufficiently Active (07/02/2022)   Exercise Vital Sign    Days of Exercise per Week: 1 day    Minutes of Exercise per Session: 20 min  Stress: No Stress Concern Present (07/02/2022)   [employer] of Occupational Health - Occupational Stress Questionnaire    Feeling of Stress : Not at all  Social Connections: Moderately Integrated (06/20/2024)   Social Connection and Isolation Panel    Frequency of Communication with Friends and Family: More than three times a week    Frequency of Social Gatherings with Friends and Family: Once a week    Attends Religious Services: More than 4 times per year    Active Member of [employer] or Organizations: Yes    Attends Banker Meetings:  Never    Marital Status: Widowed     Family History: The patient's family history includes Cancer in her maternal grandmother; Heart disease in her maternal grandfather.  ROS:   Please see the history of present illness.     All other systems reviewed and are negative.  EKGs/Labs/Other  Studies Reviewed:    The following studies were reviewed today:   EKG:   04/26/24: Atrial paced rhythm, rate 64, T wave inversions in the inferior leads and V3-6  Recent Labs: 06/20/2024: ALT 15; B Natriuretic Peptide 980.3; Magnesium  2.0 06/22/2024: TSH 4.672 07/03/2024: BUN 30; Creatinine, Ser 1.31; Hemoglobin 10.8; Platelets 210; Potassium 3.5; Sodium 139  Recent Lipid Panel    Component Value Date/Time   CHOL 102 04/26/2024 1240   TRIG 170 (H) 04/26/2024 1240   HDL 21 (L) 04/26/2024 1240   CHOLHDL 4.9 (H) 04/26/2024 1240   CHOLHDL 3 06/13/2021 1459   VLDL 48.8 (H) 06/13/2021 1459   LDLCALC 52 04/26/2024 1240   LDLDIRECT 54.0 06/13/2021 1459    Physical Exam:    VS:  BP (!) 142/72 (BP Location: Right Arm, Patient Position: Sitting)   Pulse 78   Ht 5' 9.6 (1.768 m)   Wt 119 lb 6.4 oz (54.2 kg)   BMI 17.33 kg/m     Wt Readings from Last 3 Encounters:  08/02/24 119 lb 6.4 oz (54.2 kg)  07/26/24 124 lb (56.2 kg)  07/07/24 126 lb (57.2 kg)     GEN:  Well nourished, well developed in no acute distress HEENT: Normal NECK: No JVD; No carotid bruits LYMPHATICS: No lymphadenopathy CARDIAC: RRR, no murmurs, rubs, gallops RESPIRATORY:  Clear to auscultation without rales, wheezing or rhonchi  ABDOMEN: Soft, non-tender, non-distended MUSCULOSKELETAL:  No edema; No deformity  SKIN: Warm and dry NEUROLOGIC:  Alert and oriented x 3 PSYCHIATRIC:  Normal affect   ASSESSMENT:    1. Coronary artery disease involving native coronary artery of native heart without angina pectoris   2. Apical variant hypertrophic cardiomyopathy (HCC)   3. Mitral valve insufficiency, unspecified etiology    4. PAF (paroxysmal atrial fibrillation) (HCC)   5. Essential hypertension   6. Hyperlipidemia LDL goal <70     PLAN:    CAD: Underwent PCI to LAD with BMS in 2000.  Most recent cath in 05/2022 showed patent LAD stent with 30% in-stent restenosis and mild nonobstructive CAD. - Continue Eliquis , rosuvastatin   Apical HCM: CMR 05/2020 showed apical hypertrophy measuring up to 15 mm, consistent with apical HCM, patchy LGE at apex and RV insertion site (2% of total myocardial mass), normal biventricular size and hyperdynamic systolic function.  Mitral regurgitation: Moderate on echocardiogram 05/2022.  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  She had been on on Amio 200 mg daily and Eliquis  2.5 mg twice daily - Appears during admission 06/2024 her amiodarone  was increased to 200 mg twice daily, will decrease back to 200 mg daily  CKD: Most recent creatinine 1.31 on 07/03/24  Hypertension: On hydralazine  150 mg twice daily, Imdur  30 mg daily, Toprol -XL 25 mg daily.  Appears controlled  Hyperlipidemia: On rosuvastatin  40 mg daily.  Check lipid panel  OSA: On CPAP reports having issues, recommend follow-up with sleep medicine  RTC in 6 months  Medication Adjustments/Labs and Tests Ordered: Current medicines are reviewed at length with the patient today.  Concerns regarding medicines are outlined above.  No orders of the defined types were placed in this encounter.  Meds ordered this encounter  Medications   amiodarone  (PACERONE ) 200 MG tablet    Sig: Take 1 tablet (200 mg total) by mouth daily.    Dispense:  90 tablet    Refill:  3    Patient Instructions  Medication Instructions:  Decrease Amiodarone  200 mg  once a day *If you need a refill on your cardiac medications before your next appointment, please call your pharmacy*  Lab Work: none If you have labs (blood work)  drawn today and your tests are completely normal, you will receive your results only by: MyChart Message (if you have MyChart) OR A paper copy in the mail If you have any lab test that is abnormal or we need to change your treatment, we will call you to review the results.  Testing/Procedures: none  Follow-Up: At [facility], you and your health needs are our priority.  As part of our continuing mission to provide you with exceptional heart care, our providers are all part of one team.  This team includes your primary Cardiologist (physician) and Advanced Practice Providers or APPs (Physician Assistants and Nurse Practitioners) who all work together to provide you with the care you need, when you need it.  Your next appointment:   6 months   Provider:   Dr. KATE  We recommend signing up for the patient portal called MyChart.  Sign up information is provided on this After Visit Summary.  MyChart is used to connect with patients for Virtual Visits (Telemedicine).  Patients are able to view lab/test results, encounter notes, upcoming appointments, etc.  Non-urgent messages can be sent to your provider as well.   To learn more about what you can do with MyChart, go to [email or web address].   Other Instructions None           Signed, Lonni LITTIE KATE, MD  08/03/2024 11:53 AM    [facility]

## 2024-08-02 ENCOUNTER — Ambulatory Visit: Attending: Cardiology | Admitting: Cardiology

## 2024-08-02 VITALS — BP 142/72 | HR 78 | Ht 69.6 in | Wt 119.4 lb

## 2024-08-02 DIAGNOSIS — E785 Hyperlipidemia, unspecified: Secondary | ICD-10-CM

## 2024-08-02 DIAGNOSIS — I10 Essential (primary) hypertension: Secondary | ICD-10-CM | POA: Diagnosis not present

## 2024-08-02 DIAGNOSIS — G4733 Obstructive sleep apnea (adult) (pediatric): Secondary | ICD-10-CM | POA: Diagnosis not present

## 2024-08-02 DIAGNOSIS — I2510 Atherosclerotic heart disease of native coronary artery without angina pectoris: Secondary | ICD-10-CM

## 2024-08-02 DIAGNOSIS — I340 Nonrheumatic mitral (valve) insufficiency: Secondary | ICD-10-CM | POA: Diagnosis not present

## 2024-08-02 DIAGNOSIS — I422 Other hypertrophic cardiomyopathy: Secondary | ICD-10-CM | POA: Diagnosis not present

## 2024-08-02 DIAGNOSIS — I480 Paroxysmal atrial fibrillation: Secondary | ICD-10-CM | POA: Diagnosis not present

## 2024-08-02 MED ORDER — AMIODARONE HCL 200 MG PO TABS: 200.0000 mg | ORAL_TABLET | Freq: Every day | ORAL | 3 refills | Status: AC

## 2024-08-02 NOTE — Patient Instructions (Addendum)
 Medication Instructions:  Decrease Amiodarone  200 mg  once a day *If you need a refill on your cardiac medications before your next appointment, please call your pharmacy*  Lab Work: none If you have labs (blood work) drawn today and your tests are completely normal, you will receive your results only by: MyChart Message (if you have MyChart) OR A paper copy in the mail If you have any lab test that is abnormal or we need to change your treatment, we will call you to review the results.  Testing/Procedures: none  Follow-Up: At [facility], you and your health needs are our priority.  As part of our continuing mission to provide you with exceptional heart care, our providers are all part of one team.  This team includes your primary Cardiologist (physician) and Advanced Practice Providers or APPs (Physician Assistants and Nurse Practitioners) who all work together to provide you with the care you need, when you need it.  Your next appointment:   6 months   Provider:   Dr. KATE  We recommend signing up for the patient portal called MyChart.  Sign up information is provided on this After Visit Summary.  MyChart is used to connect with patients for Virtual Visits (Telemedicine).  Patients are able to view lab/test results, encounter notes, upcoming appointments, etc.  Non-urgent messages can be sent to your provider as well.   To learn more about what you can do with MyChart, go to [email or web address].   Other Instructions None

## 2024-08-02 NOTE — Telephone Encounter (Signed)
 Spoke with Jenna from [facility],voiced understanding Dr Johnny recommendation regarding treating pt blister

## 2024-08-09 ENCOUNTER — Ambulatory Visit (INDEPENDENT_AMBULATORY_CARE_PROVIDER_SITE_OTHER): Payer: Medicare Other

## 2024-08-09 DIAGNOSIS — I2510 Atherosclerotic heart disease of native coronary artery without angina pectoris: Secondary | ICD-10-CM

## 2024-08-10 LAB — CUP PACEART REMOTE DEVICE CHECK
Battery Remaining Longevity: 135 mo
Battery Voltage: 3.03 V
Brady Statistic AP VP Percent: 0.04 %
Brady Statistic AP VS Percent: 99.21 %
Brady Statistic AS VP Percent: 0 %
Brady Statistic AS VS Percent: 0.75 %
Brady Statistic RA Percent Paced: 99.14 %
Brady Statistic RV Percent Paced: 0.04 %
Date Time Interrogation Session: 20251005223414
Implantable Lead Connection Status: 753985
Implantable Lead Connection Status: 753985
Implantable Lead Implant Date: 20230705
Implantable Lead Implant Date: 20230705
Implantable Lead Location: 753859
Implantable Lead Location: 753860
Implantable Lead Model: 3830
Implantable Lead Model: 5076
Implantable Pulse Generator Implant Date: 20230705
Lead Channel Impedance Value: 323 Ohm
Lead Channel Impedance Value: 361 Ohm
Lead Channel Impedance Value: 437 Ohm
Lead Channel Impedance Value: 475 Ohm
Lead Channel Pacing Threshold Amplitude: 0.75 V
Lead Channel Pacing Threshold Amplitude: 1 V
Lead Channel Pacing Threshold Pulse Width: 0.4 ms
Lead Channel Pacing Threshold Pulse Width: 0.4 ms
Lead Channel Sensing Intrinsic Amplitude: 12.375 mV
Lead Channel Sensing Intrinsic Amplitude: 12.375 mV
Lead Channel Sensing Intrinsic Amplitude: 2.25 mV
Lead Channel Sensing Intrinsic Amplitude: 2.25 mV
Lead Channel Setting Pacing Amplitude: 1.75 V
Lead Channel Setting Pacing Amplitude: 2 V
Lead Channel Setting Pacing Pulse Width: 0.4 ms
Lead Channel Setting Sensing Sensitivity: 0.9 mV
Zone Setting Status: 755011
Zone Setting Status: 755011

## 2024-08-10 NOTE — Progress Notes (Signed)
 Remote PPM Transmission

## 2024-08-15 ENCOUNTER — Ambulatory Visit: Payer: Self-pay | Admitting: Cardiology

## 2024-08-16 NOTE — Progress Notes (Signed)
 Remote PPM Transmission

## 2024-08-30 DIAGNOSIS — M25562 Pain in left knee: Secondary | ICD-10-CM | POA: Diagnosis not present

## 2024-08-30 DIAGNOSIS — M545 Low back pain, unspecified: Secondary | ICD-10-CM | POA: Diagnosis not present

## 2024-09-01 ENCOUNTER — Telehealth: Payer: Self-pay | Admitting: *Deleted

## 2024-09-01 NOTE — Telephone Encounter (Signed)
 She should be fine to take the Prednisone. I went through her chart and I can't find any evidence that she had Prednisone this summer before the afib started

## 2024-09-01 NOTE — Telephone Encounter (Signed)
 Spoke with pt notified of Dr Johnny advised regarding taking prednisone. Pt voiced udnerstanding

## 2024-09-01 NOTE — Telephone Encounter (Signed)
 Copied from CRM [id number]. Topic: Clinical - Medication Question >> Aug 31, 2024  4:10 PM Barbara [name] wrote: Reason for CRM: Pt was prescribed prednisone for her knee and back pain and wants to make sure it will not exacerbate her afib. She states she took one pill prescribed this summer by Dr. Johnny and it caused her to go to the hospital with afib. She wants to make sure that was not prednisone. Please give pt a call back at [phone], she will be picking up this medication this afternoon.

## 2024-09-03 ENCOUNTER — Telehealth: Payer: Self-pay | Admitting: Family Medicine

## 2024-09-03 NOTE — Telephone Encounter (Signed)
 Any idea of this kind of a form. Please advise

## 2024-09-03 NOTE — Telephone Encounter (Signed)
 Spoke with pt advised to contact her post office and request for them to fax the form to our office for Dr Johnny to complete

## 2024-09-03 NOTE — Telephone Encounter (Signed)
 I would be happy to fill out this form, but she needs to get one from the post office and forward this to us 

## 2024-09-03 NOTE — Telephone Encounter (Signed)
 Copied from CRM [id number]. Topic: General - Other >> Sep 03, 2024 10:52 AM Alfonso HERO wrote: Reason for CRM: patient asking for a form to be completed for the post office to be notified to bring her mail to the back door because she can't walk out the front to get her mail. This is something the mailman told her.

## 2024-09-08 ENCOUNTER — Ambulatory Visit: Admitting: Podiatry

## 2024-09-24 ENCOUNTER — Telehealth: Payer: Self-pay | Admitting: Family Medicine

## 2024-09-24 NOTE — Telephone Encounter (Signed)
 Patient dropped off document USPS Request, to be filled out by provider. Patient requested to send it back via Call Patient to pick up within 5-days. Document is located in providers tray at front office.Please advise at Mobile [phone] (mobile)

## 2024-09-28 NOTE — Telephone Encounter (Signed)
 Left detailed message for pt, advised to pick up her mail form and letter from the office, pt mail placed in the front office filing cabinet. Copy send to scanning.

## 2024-09-28 NOTE — Telephone Encounter (Signed)
 FYI Pt form received and placed in Dr Johnny red folder

## 2024-09-28 NOTE — Telephone Encounter (Signed)
 The letter and form are ready

## 2024-09-29 NOTE — Telephone Encounter (Signed)
 Spoke with pt advised to pick up form and the letter from the front office

## 2024-10-04 ENCOUNTER — Telehealth: Payer: Self-pay | Admitting: Family Medicine

## 2024-10-04 MED ORDER — LOSARTAN POTASSIUM 50 MG PO TABS: 50.0000 mg | ORAL_TABLET | Freq: Every day | ORAL | 5 refills | Status: DC

## 2024-10-04 NOTE — Telephone Encounter (Signed)
 Spoke with [facility] advised that VO requested have been approved by Dr Johnny, also notified nurse of the new Rx for Losartan  for pt BP. I left detailed message for pt on her voicemail regarding this, will try call pt again in the morning

## 2024-10-04 NOTE — Telephone Encounter (Unsigned)
 Copied from CRM #[id number]. Topic: Clinical - Home Health Verbal Orders >> Oct 04, 2024 12:27 PM Tonda B wrote: Caller/Agency: [facility] Callback Number: [phone] Service Requested: Physical Therapy Frequency: 1 time a week for 5 weeks Any new concerns about the patient? Yes Stange 2 pressure sore and blood pressure is 160/80

## 2024-10-04 NOTE — Telephone Encounter (Signed)
 I started her on Losartan  50 mg daily for the BP. Also. Please okay the PT orders

## 2024-10-05 ENCOUNTER — Encounter: Payer: Self-pay | Admitting: Family Medicine

## 2024-10-05 ENCOUNTER — Telehealth: Payer: Self-pay

## 2024-10-05 ENCOUNTER — Ambulatory Visit

## 2024-10-05 ENCOUNTER — Ambulatory Visit: Admitting: Family Medicine

## 2024-10-05 VITALS — BP 168/60 | HR 63 | Temp 98.0°F | Wt 130.0 lb

## 2024-10-05 DIAGNOSIS — I480 Paroxysmal atrial fibrillation: Secondary | ICD-10-CM

## 2024-10-05 DIAGNOSIS — M25472 Effusion, left ankle: Secondary | ICD-10-CM | POA: Diagnosis not present

## 2024-10-05 DIAGNOSIS — I10 Essential (primary) hypertension: Secondary | ICD-10-CM | POA: Diagnosis not present

## 2024-10-05 DIAGNOSIS — M545 Low back pain, unspecified: Secondary | ICD-10-CM

## 2024-10-05 DIAGNOSIS — Z23 Encounter for immunization: Secondary | ICD-10-CM | POA: Diagnosis not present

## 2024-10-05 DIAGNOSIS — M25471 Effusion, right ankle: Secondary | ICD-10-CM | POA: Diagnosis not present

## 2024-10-05 DIAGNOSIS — N1832 Chronic kidney disease, stage 3b: Secondary | ICD-10-CM | POA: Diagnosis not present

## 2024-10-05 DIAGNOSIS — R739 Hyperglycemia, unspecified: Secondary | ICD-10-CM

## 2024-10-05 NOTE — Progress Notes (Signed)
   Subjective:    Patient ID: Barbara [name], female    DOB: November 29, 1940, 83 y.o.   MRN: [id number]  HPI Here several issues. First she slid out of a recliner onto the floor a few days ago, and she has had some low back pain since then. She did not hit her head. Second she asks if she can stop taking Lasix . She is taking 20 mg of this a day, and it makes her urinate frequently. She has some incontinence, and this makes that even worse. She has a small amount of swelling in the ankles but it never goes up her legs. No SOB. She saw Cardiology on 08-02-24, and she will see Pulmonary on 10-18-24. Her BP has been stable.    Review of Systems  Constitutional: Negative.   Respiratory: Negative.    Cardiovascular: Negative.   Gastrointestinal: Negative.   Genitourinary:  Positive for frequency. Negative for dysuria and urgency.  Musculoskeletal:  Positive for back pain.       Objective:   Physical Exam Constitutional:      General: She is not in acute distress.    Comments: In a wheelchair   Cardiovascular:     Rate and Rhythm: Normal rate and regular rhythm.     Pulses: Normal pulses.     Heart sounds: Normal heart sounds.  Pulmonary:     Effort: Pulmonary effort is normal.     Breath sounds: Normal breath sounds.  Musculoskeletal:     Comments: Both ankles show 2 + edema   Neurological:     Mental Status: She is alert.           Assessment & Plan:  Her ankle edema has been well controlled, so we will stop the Lasix . We can resume this if her legs begin to swell more. We will get labs today to check her renal function, etc . She has a lumbar contusion, so we will get Xrays of her lumbar spine today. Her PAF and HTN are stable.  Garnette Olmsted, MD

## 2024-10-05 NOTE — Addendum Note (Signed)
 Addended by: LADONNA INOCENTE SAILOR on: 10/05/2024 04:44 PM   Modules accepted: Orders

## 2024-10-05 NOTE — Telephone Encounter (Signed)
 Spoke with pt advised that Dr Johnny sent a new Rx for Losartan  for her elevated BP, pt stated that  she has appointment with Dr Johnny this morning for swelling on her ankles. Voiced understanding Copied from CRM #[id number]. Topic: General - Other >> Oct 04, 2024  5:24 PM Alfonso HERO wrote: Reason for CRM: patient returning Dr Johnny call asking for a call back.

## 2024-10-05 NOTE — Telephone Encounter (Signed)
 Spoke with [facility] agent who stated that she would rely message to Marval that we received her message

## 2024-10-05 NOTE — Telephone Encounter (Signed)
 Noted, spoke with one of the agents from [facility] Copied from CRM [id number]. Topic: General - Other >> Oct 04, 2024  2:19 PM Fonda T wrote: Reason for CRM: Received call from [employer] with [facility], calling on behalf of pt.  Calling to inform they were unable to see pt over the weekend, but home health will be going out today to see pt.  Debbie requesting a return call to [employer] was received.  Can be reached back at [phone].  Aware of same day call back.

## 2024-10-06 ENCOUNTER — Telehealth: Payer: Self-pay | Admitting: *Deleted

## 2024-10-06 ENCOUNTER — Ambulatory Visit: Payer: Self-pay | Admitting: Family Medicine

## 2024-10-06 LAB — HEPATIC FUNCTION PANEL
ALT: 18 U/L (ref 0–35)
AST: 30 U/L (ref 0–37)
Albumin: 3.3 g/dL — ABNORMAL LOW (ref 3.5–5.2)
Alkaline Phosphatase: 108 U/L (ref 39–117)
Bilirubin, Direct: 0.3 mg/dL (ref 0.0–0.3)
Total Bilirubin: 1.2 mg/dL (ref 0.2–1.2)
Total Protein: 6.7 g/dL (ref 6.0–8.3)

## 2024-10-06 LAB — CBC WITH DIFFERENTIAL/PLATELET
Basophils Absolute: 0 K/uL (ref 0.0–0.1)
Basophils Relative: 0.6 % (ref 0.0–3.0)
Eosinophils Absolute: 0.1 K/uL (ref 0.0–0.7)
Eosinophils Relative: 2.4 % (ref 0.0–5.0)
HCT: 40.3 % (ref 36.0–46.0)
Hemoglobin: 13.1 g/dL (ref 12.0–15.0)
Lymphocytes Relative: 12.5 % (ref 12.0–46.0)
Lymphs Abs: 0.7 K/uL (ref 0.7–4.0)
MCHC: 32.5 g/dL (ref 30.0–36.0)
MCV: 87.1 fl (ref 78.0–100.0)
Monocytes Absolute: 0.4 K/uL (ref 0.1–1.0)
Monocytes Relative: 6.8 % (ref 3.0–12.0)
Neutro Abs: 4.1 K/uL (ref 1.4–7.7)
Neutrophils Relative %: 77.7 % — ABNORMAL HIGH (ref 43.0–77.0)
Platelets: 129 K/uL — ABNORMAL LOW (ref 150.0–400.0)
RBC: 4.63 Mil/uL (ref 3.87–5.11)
RDW: 18.2 % — ABNORMAL HIGH (ref 11.5–15.5)
WBC: 5.3 K/uL (ref 4.0–10.5)

## 2024-10-06 LAB — BASIC METABOLIC PANEL WITH GFR
BUN: 24 mg/dL — ABNORMAL HIGH (ref 6–23)
CO2: 23 meq/L (ref 19–32)
Calcium: 10.3 mg/dL (ref 8.4–10.5)
Chloride: 107 meq/L (ref 96–112)
Creatinine, Ser: 1.22 mg/dL — ABNORMAL HIGH (ref 0.40–1.20)
GFR: 40.45 mL/min — ABNORMAL LOW (ref >60.00)
Glucose, Bld: 79 mg/dL (ref 70–99)
Potassium: 3.8 meq/L (ref 3.5–5.1)
Sodium: 140 meq/L (ref 135–145)

## 2024-10-06 LAB — HEMOGLOBIN A1C: Hgb A1c MFr Bld: 5.8 % (ref 4.6–6.5)

## 2024-10-06 LAB — TSH: TSH: 3.09 u[IU]/mL (ref 0.35–5.50)

## 2024-10-06 NOTE — Telephone Encounter (Signed)
 Left a detailed message for [facility] regarding orders requested, advised to cal the office back

## 2024-10-06 NOTE — Telephone Encounter (Signed)
 Copied from CRM [id number]. Topic: Clinical - Home Health Verbal Orders >> Oct 05, 2024  4:49 PM Tinnie BROCKS wrote: Izola, a PT with [facility] is calling to follow up on request from yesterday regarding asking for skilled nursing/wound care orders for patient due to stage 2 pressure sore. She is requesting a call back at [phone]

## 2024-10-07 NOTE — Telephone Encounter (Signed)
  FYI Spoke with [employer], a PT with [facility] given VO for skilled nursing/wound care orders for patient due to stage 2 pressure sore.

## 2024-10-07 NOTE — Telephone Encounter (Signed)
 Agreed -

## 2024-10-09 ENCOUNTER — Encounter (HOSPITAL_COMMUNITY): Payer: Self-pay

## 2024-10-09 ENCOUNTER — Emergency Department (HOSPITAL_COMMUNITY)

## 2024-10-09 ENCOUNTER — Inpatient Hospital Stay (HOSPITAL_COMMUNITY)
Admission: EM | Admit: 2024-10-09 | Discharge: 2024-11-06 | DRG: 280 | Disposition: A | Discharge status: Home Health | Attending: Internal Medicine | Admitting: Internal Medicine

## 2024-10-09 ENCOUNTER — Other Ambulatory Visit: Payer: Self-pay

## 2024-10-09 DIAGNOSIS — Z882 Allergy status to sulfonamides status: Secondary | ICD-10-CM

## 2024-10-09 DIAGNOSIS — R61 Generalized hyperhidrosis: Secondary | ICD-10-CM | POA: Diagnosis present

## 2024-10-09 DIAGNOSIS — W07XXXA Fall from chair, initial encounter: Secondary | ICD-10-CM | POA: Diagnosis present

## 2024-10-09 DIAGNOSIS — I5033 Acute on chronic diastolic (congestive) heart failure: Secondary | ICD-10-CM | POA: Diagnosis present

## 2024-10-09 DIAGNOSIS — M545 Low back pain, unspecified: Secondary | ICD-10-CM | POA: Diagnosis present

## 2024-10-09 DIAGNOSIS — I5031 Acute diastolic (congestive) heart failure: Secondary | ICD-10-CM | POA: Diagnosis present

## 2024-10-09 DIAGNOSIS — Z8249 Family history of ischemic heart disease and other diseases of the circulatory system: Secondary | ICD-10-CM

## 2024-10-09 DIAGNOSIS — I10 Essential (primary) hypertension: Secondary | ICD-10-CM | POA: Diagnosis present

## 2024-10-09 DIAGNOSIS — Z950 Presence of cardiac pacemaker: Secondary | ICD-10-CM

## 2024-10-09 DIAGNOSIS — I422 Other hypertrophic cardiomyopathy: Secondary | ICD-10-CM | POA: Diagnosis present

## 2024-10-09 DIAGNOSIS — Z888 Allergy status to other drugs, medicaments and biological substances status: Secondary | ICD-10-CM

## 2024-10-09 DIAGNOSIS — I2510 Atherosclerotic heart disease of native coronary artery without angina pectoris: Secondary | ICD-10-CM | POA: Diagnosis present

## 2024-10-09 DIAGNOSIS — Y831 Surgical operation with implant of artificial internal device as the cause of abnormal reaction of the patient, or of later complication, without mention of misadventure at the time of the procedure: Secondary | ICD-10-CM | POA: Diagnosis present

## 2024-10-09 DIAGNOSIS — Z1152 Encounter for screening for COVID-19: Secondary | ICD-10-CM

## 2024-10-09 DIAGNOSIS — Z860100 Personal history of colon polyps, unspecified: Secondary | ICD-10-CM

## 2024-10-09 DIAGNOSIS — I252 Old myocardial infarction: Secondary | ICD-10-CM

## 2024-10-09 DIAGNOSIS — D649 Anemia, unspecified: Secondary | ICD-10-CM | POA: Diagnosis present

## 2024-10-09 DIAGNOSIS — I2583 Coronary atherosclerosis due to lipid rich plaque: Secondary | ICD-10-CM

## 2024-10-09 DIAGNOSIS — S39012A Strain of muscle, fascia and tendon of lower back, initial encounter: Secondary | ICD-10-CM | POA: Diagnosis not present

## 2024-10-09 DIAGNOSIS — Z96642 Presence of left artificial hip joint: Secondary | ICD-10-CM | POA: Diagnosis present

## 2024-10-09 DIAGNOSIS — N1832 Chronic kidney disease, stage 3b: Secondary | ICD-10-CM | POA: Diagnosis present

## 2024-10-09 DIAGNOSIS — I480 Paroxysmal atrial fibrillation: Secondary | ICD-10-CM | POA: Diagnosis present

## 2024-10-09 DIAGNOSIS — E7800 Pure hypercholesterolemia, unspecified: Secondary | ICD-10-CM

## 2024-10-09 DIAGNOSIS — E872 Acidosis, unspecified: Secondary | ICD-10-CM | POA: Diagnosis present

## 2024-10-09 DIAGNOSIS — Z7901 Long term (current) use of anticoagulants: Secondary | ICD-10-CM

## 2024-10-09 DIAGNOSIS — T82855A Stenosis of coronary artery stent, initial encounter: Secondary | ICD-10-CM | POA: Diagnosis present

## 2024-10-09 DIAGNOSIS — Z79899 Other long term (current) drug therapy: Secondary | ICD-10-CM

## 2024-10-09 DIAGNOSIS — I25110 Atherosclerotic heart disease of native coronary artery with unstable angina pectoris: Secondary | ICD-10-CM | POA: Diagnosis not present

## 2024-10-09 DIAGNOSIS — Z7982 Long term (current) use of aspirin: Secondary | ICD-10-CM

## 2024-10-09 DIAGNOSIS — Z7984 Long term (current) use of oral hypoglycemic drugs: Secondary | ICD-10-CM

## 2024-10-09 DIAGNOSIS — I495 Sick sinus syndrome: Secondary | ICD-10-CM | POA: Diagnosis present

## 2024-10-09 DIAGNOSIS — E785 Hyperlipidemia, unspecified: Secondary | ICD-10-CM | POA: Diagnosis present

## 2024-10-09 DIAGNOSIS — Z809 Family history of malignant neoplasm, unspecified: Secondary | ICD-10-CM

## 2024-10-09 DIAGNOSIS — E440 Moderate protein-calorie malnutrition: Secondary | ICD-10-CM | POA: Diagnosis present

## 2024-10-09 DIAGNOSIS — I214 Non-ST elevation (NSTEMI) myocardial infarction: Secondary | ICD-10-CM | POA: Diagnosis not present

## 2024-10-09 DIAGNOSIS — R627 Adult failure to thrive: Secondary | ICD-10-CM | POA: Diagnosis present

## 2024-10-09 DIAGNOSIS — I130 Hypertensive heart and chronic kidney disease with heart failure and stage 1 through stage 4 chronic kidney disease, or unspecified chronic kidney disease: Secondary | ICD-10-CM | POA: Diagnosis present

## 2024-10-09 DIAGNOSIS — Z5982 Transportation insecurity: Secondary | ICD-10-CM

## 2024-10-09 DIAGNOSIS — Z96611 Presence of right artificial shoulder joint: Secondary | ICD-10-CM | POA: Diagnosis present

## 2024-10-09 DIAGNOSIS — I200 Unstable angina: Secondary | ICD-10-CM | POA: Diagnosis not present

## 2024-10-09 DIAGNOSIS — D631 Anemia in chronic kidney disease: Secondary | ICD-10-CM | POA: Diagnosis present

## 2024-10-09 DIAGNOSIS — K219 Gastro-esophageal reflux disease without esophagitis: Secondary | ICD-10-CM | POA: Diagnosis present

## 2024-10-09 DIAGNOSIS — M1990 Unspecified osteoarthritis, unspecified site: Secondary | ICD-10-CM | POA: Diagnosis present

## 2024-10-09 DIAGNOSIS — E876 Hypokalemia: Secondary | ICD-10-CM | POA: Diagnosis present

## 2024-10-09 DIAGNOSIS — K30 Functional dyspepsia: Secondary | ICD-10-CM | POA: Diagnosis not present

## 2024-10-09 DIAGNOSIS — Z91040 Latex allergy status: Secondary | ICD-10-CM

## 2024-10-09 DIAGNOSIS — Z87891 Personal history of nicotine dependence: Secondary | ICD-10-CM

## 2024-10-09 DIAGNOSIS — I340 Nonrheumatic mitral (valve) insufficiency: Secondary | ICD-10-CM | POA: Diagnosis present

## 2024-10-09 DIAGNOSIS — Z91148 Patient's other noncompliance with medication regimen for other reason: Secondary | ICD-10-CM

## 2024-10-09 DIAGNOSIS — Z885 Allergy status to narcotic agent status: Secondary | ICD-10-CM

## 2024-10-09 DIAGNOSIS — Z681 Body mass index (BMI) 19 or less, adult: Secondary | ICD-10-CM

## 2024-10-09 DIAGNOSIS — L89156 Pressure-induced deep tissue damage of sacral region: Secondary | ICD-10-CM | POA: Diagnosis present

## 2024-10-09 LAB — URINALYSIS, ROUTINE W REFLEX MICROSCOPIC
Bilirubin Urine: NEGATIVE
Glucose, UA: >=500 mg/dL — AB
Hgb urine dipstick: NEGATIVE
Ketones, ur: NEGATIVE mg/dL
Nitrite: NEGATIVE
Protein, ur: 100 mg/dL — AB
Specific Gravity, Urine: 1.028 (ref 1.005–1.030)
pH: 5 (ref 5.0–8.0)

## 2024-10-09 LAB — URINALYSIS, COMPLETE (UACMP) WITH MICROSCOPIC
Bilirubin Urine: NEGATIVE
Glucose, UA: >=500 mg/dL — AB
Hgb urine dipstick: NEGATIVE
Ketones, ur: NEGATIVE mg/dL
Leukocytes,Ua: NEGATIVE
Nitrite: NEGATIVE
Protein, ur: NEGATIVE mg/dL
Specific Gravity, Urine: 1.003 — ABNORMAL LOW (ref 1.005–1.030)
pH: 7 (ref 5.0–8.0)

## 2024-10-09 LAB — TROPONIN I (HIGH SENSITIVITY)
Troponin I (High Sensitivity): 105 ng/L (ref <18)
Troponin I (High Sensitivity): 71 ng/L — ABNORMAL HIGH (ref <18)
Troponin I (High Sensitivity): 85 ng/L — ABNORMAL HIGH (ref <18)
Troponin I (High Sensitivity): 60 ng/L — ABNORMAL HIGH (ref <18)
Troponin I (High Sensitivity): 80 ng/L — ABNORMAL HIGH (ref <18)

## 2024-10-09 LAB — BASIC METABOLIC PANEL WITH GFR
Anion gap: 9 (ref 5–15)
BUN: 20 mg/dL (ref 8–23)
CO2: 21 mmol/L — ABNORMAL LOW (ref 22–32)
Calcium: 10.3 mg/dL (ref 8.9–10.3)
Chloride: 109 mmol/L (ref 98–111)
Creatinine, Ser: 1.29 mg/dL — ABNORMAL HIGH (ref 0.44–1.00)
GFR, Estimated: 41 mL/min — ABNORMAL LOW (ref >60)
Glucose, Bld: 96 mg/dL (ref 70–99)
Potassium: 3.1 mmol/L — ABNORMAL LOW (ref 3.5–5.1)
Sodium: 139 mmol/L (ref 135–145)

## 2024-10-09 LAB — RESP PANEL BY RT-PCR (RSV, FLU A&B, COVID)  RVPGX2
Influenza A by PCR: NEGATIVE
Influenza B by PCR: NEGATIVE
Resp Syncytial Virus by PCR: NEGATIVE
SARS Coronavirus 2 by RT PCR: NEGATIVE

## 2024-10-09 LAB — CK: Total CK: 36 U/L — ABNORMAL LOW (ref 38–234)

## 2024-10-09 LAB — TSH: TSH: 2.053 u[IU]/mL (ref 0.350–4.500)

## 2024-10-09 LAB — CBC
HCT: 42.9 % (ref 36.0–46.0)
Hemoglobin: 13.7 g/dL (ref 12.0–15.0)
MCH: 28.1 pg (ref 26.0–34.0)
MCHC: 31.9 g/dL (ref 30.0–36.0)
MCV: 88.1 fL (ref 80.0–100.0)
Platelets: 228 K/uL (ref 150–400)
RBC: 4.87 MIL/uL (ref 3.87–5.11)
RDW: 17.9 % — ABNORMAL HIGH (ref 11.5–15.5)
WBC: 4.7 K/uL (ref 4.0–10.5)
nRBC: 0 % (ref 0.0–0.2)

## 2024-10-09 LAB — HEPARIN LEVEL (UNFRACTIONATED): Heparin Unfractionated: >1.1 [IU]/mL — ABNORMAL HIGH (ref 0.30–0.70)

## 2024-10-09 LAB — PROCALCITONIN: Procalcitonin: 1.03 ng/mL

## 2024-10-09 LAB — PHOSPHORUS: Phosphorus: 1.4 mg/dL — ABNORMAL LOW (ref 2.5–4.6)

## 2024-10-09 LAB — MRSA NEXT GEN BY PCR, NASAL: MRSA by PCR Next Gen: NOT DETECTED

## 2024-10-09 LAB — APTT: aPTT: 45 s — ABNORMAL HIGH (ref 24–36)

## 2024-10-09 LAB — MAGNESIUM: Magnesium: 1.2 mg/dL — ABNORMAL LOW (ref 1.7–2.4)

## 2024-10-09 MED ORDER — POTASSIUM CHLORIDE 10 MEQ/100ML IV SOLN: 10.0000 meq | INTRAVENOUS | Status: DC

## 2024-10-09 MED ORDER — SUCRALFATE 1 GM/10ML PO SUSP
1.0000 g | Freq: Two times a day (BID) | ORAL | Status: DC
  Administered 2024-10-09 – 2024-10-16 (×13): 1 g via ORAL
  Filled 2024-10-09 (×13): qty 10

## 2024-10-09 MED ORDER — POTASSIUM CHLORIDE 20 MEQ PO PACK
60.0000 meq | PACK | Freq: Once | ORAL | Status: AC
  Administered 2024-10-09: 60 meq via ORAL
  Filled 2024-10-09: qty 3

## 2024-10-09 MED ORDER — ASPIRIN 81 MG PO TBEC
81.0000 mg | DELAYED_RELEASE_TABLET | Freq: Every day | ORAL | Status: DC
  Administered 2024-10-09 – 2024-10-12 (×3): 81 mg via ORAL
  Filled 2024-10-09 (×4): qty 1

## 2024-10-09 MED ORDER — ONDANSETRON HCL 4 MG/2ML IJ SOLN
4.0000 mg | Freq: Four times a day (QID) | INTRAMUSCULAR | Status: DC | PRN
  Administered 2024-10-13: 4 mg via INTRAVENOUS
  Filled 2024-10-09 (×2): qty 2

## 2024-10-09 MED ORDER — POTASSIUM PHOSPHATES 15 MMOLE/5ML IV SOLN
15.0000 mmol | Freq: Once | INTRAVENOUS | Status: AC
  Administered 2024-10-10: 15 mmol via INTRAVENOUS
  Filled 2024-10-09: qty 5

## 2024-10-09 MED ORDER — SODIUM CHLORIDE 0.9% FLUSH
3.0000 mL | Freq: Two times a day (BID) | INTRAVENOUS | Status: DC
  Administered 2024-10-09 – 2024-10-23 (×24): 3 mL via INTRAVENOUS

## 2024-10-09 MED ORDER — SODIUM CHLORIDE 0.9 % IV SOLN: 250.0000 mL | INTRAVENOUS | Status: AC | PRN

## 2024-10-09 MED ORDER — ACETAMINOPHEN 325 MG PO TABS
650.0000 mg | ORAL_TABLET | Freq: Four times a day (QID) | ORAL | Status: DC | PRN
  Administered 2024-10-11: 650 mg via ORAL
  Filled 2024-10-09: qty 2

## 2024-10-09 MED ORDER — LORATADINE 10 MG PO TABS: 10.0000 mg | ORAL_TABLET | Freq: Every day | ORAL | Status: DC | PRN

## 2024-10-09 MED ORDER — PANTOPRAZOLE SODIUM 40 MG PO TBEC
40.0000 mg | DELAYED_RELEASE_TABLET | Freq: Every day | ORAL | Status: DC
  Administered 2024-10-10 – 2024-11-06 (×28): 40 mg via ORAL
  Filled 2024-10-09 (×22): qty 1

## 2024-10-09 MED ORDER — METOPROLOL TARTRATE 25 MG PO TABS
25.0000 mg | ORAL_TABLET | Freq: Two times a day (BID) | ORAL | Status: DC
  Administered 2024-10-09 – 2024-11-06 (×56): 25 mg via ORAL
  Filled 2024-10-09 (×45): qty 1

## 2024-10-09 MED ORDER — FUROSEMIDE 10 MG/ML IJ SOLN
40.0000 mg | Freq: Once | INTRAMUSCULAR | Status: AC
  Administered 2024-10-09: 40 mg via INTRAVENOUS
  Filled 2024-10-09: qty 4

## 2024-10-09 MED ORDER — THIAMINE HCL 100 MG/ML IJ SOLN
100.0000 mg | Freq: Every day | INTRAMUSCULAR | Status: DC
  Administered 2024-10-09 – 2024-10-11 (×3): 100 mg via INTRAVENOUS
  Filled 2024-10-09 (×3): qty 2

## 2024-10-09 MED ORDER — AMIODARONE HCL 200 MG PO TABS
200.0000 mg | ORAL_TABLET | Freq: Every day | ORAL | Status: DC
  Administered 2024-10-10 – 2024-11-06 (×28): 200 mg via ORAL
  Filled 2024-10-09 (×22): qty 1

## 2024-10-09 MED ORDER — HEPARIN (PORCINE) 25000 UT/250ML-% IV SOLN
700.0000 [IU]/h | INTRAVENOUS | Status: DC
  Administered 2024-10-09 – 2024-10-11 (×2): 700 [IU]/h via INTRAVENOUS
  Filled 2024-10-09 (×3): qty 250

## 2024-10-09 MED ORDER — ONDANSETRON HCL 4 MG PO TABS
4.0000 mg | ORAL_TABLET | Freq: Four times a day (QID) | ORAL | Status: DC | PRN
  Administered 2024-11-01: 4 mg via ORAL

## 2024-10-09 MED ORDER — ACETAMINOPHEN 650 MG RE SUPP: 650.0000 mg | Freq: Four times a day (QID) | RECTAL | Status: DC | PRN

## 2024-10-09 MED ORDER — SODIUM CHLORIDE 0.9 % IV BOLUS
1000.0000 mL | Freq: Once | INTRAVENOUS | Status: AC
  Administered 2024-10-09: 1000 mL via INTRAVENOUS

## 2024-10-09 MED ORDER — EMPAGLIFLOZIN 10 MG PO TABS
10.0000 mg | ORAL_TABLET | Freq: Every day | ORAL | Status: DC
  Administered 2024-10-10 – 2024-11-06 (×28): 10 mg via ORAL
  Filled 2024-10-09 (×22): qty 1

## 2024-10-09 MED ORDER — ALBUTEROL SULFATE (2.5 MG/3ML) 0.083% IN NEBU: 2.5000 mg | INHALATION_SOLUTION | RESPIRATORY_TRACT | Status: DC | PRN

## 2024-10-09 MED ORDER — SODIUM CHLORIDE 0.9% FLUSH: 3.0000 mL | INTRAVENOUS | Status: DC | PRN

## 2024-10-09 MED ORDER — ALUM & MAG HYDROXIDE-SIMETH 200-200-20 MG/5ML PO SUSP
30.0000 mL | ORAL | Status: DC | PRN
  Administered 2024-10-16 – 2024-11-05 (×3): 30 mL via ORAL
  Filled 2024-10-09 (×2): qty 30

## 2024-10-09 MED ORDER — MAGNESIUM SULFATE 2 GM/50ML IV SOLN
2.0000 g | Freq: Once | INTRAVENOUS | Status: AC
  Administered 2024-10-09: 2 g via INTRAVENOUS
  Filled 2024-10-09: qty 50

## 2024-10-09 NOTE — ED Notes (Signed)
 Troponin level 105 critical value from main lab relayed to Dr. Dean

## 2024-10-09 NOTE — Assessment & Plan Note (Signed)
-   will replace electrolytes and repeat  check Mg, phos and Ca level and replace as needed Monitor on telemetry   Lab Results  Component Value Date   K 3.1 (L) 10/09/2024     Lab Results  Component Value Date   CREATININE 1.29 (H) 10/09/2024   Lab Results  Component Value Date   MG 2.0 06/20/2024   Lab Results  Component Value Date   CALCIUM  10.3 10/09/2024   PHOS 2.7 06/21/2024

## 2024-10-09 NOTE — Assessment & Plan Note (Signed)
 Continue Protonix  history of GI bleed

## 2024-10-09 NOTE — Assessment & Plan Note (Signed)
 Appreciate cardiology involvement.  Currently on aspirin  metoprolol  25 mg twice daily and heparin  for possible NSTEMI

## 2024-10-09 NOTE — ED Triage Notes (Signed)
 PT states chest pain and sob since yesterday.  Took 3 nitroglycerin  in total since last night, last one at 3 am, with some relief.

## 2024-10-09 NOTE — Assessment & Plan Note (Addendum)
 Hold Eliquis  continue metoprolol  25 mg twice daily Continue amiodarone  200 mg daily

## 2024-10-09 NOTE — ED Notes (Signed)
 Heparin  to be given at 10 PM per pharmacy. Med has been pulled and primed, at bedside.

## 2024-10-09 NOTE — Progress Notes (Signed)
 ANTICOAGULATION CONSULT NOTE  Pharmacy Consult for Heparin  Indication: chest pain/ACS  Allergies  Allergen Reactions   Lidocaine  Anaphylaxis   Morphine  Other (See Comments)    Body shuts down   Procaine Hcl Anaphylaxis, Rash and Other (See Comments)    Anything with 'caine' in it    Sulfonamide Derivatives Hives   Amlodipine  Swelling   Ezetimibe-Simvastatin Other (See Comments)    VYTORIN = Joint pain   Latex Itching and Other (See Comments)    WELTS   Norco [Hydrocodone -Acetaminophen ] Nausea And Vomiting and Other (See Comments)    States does ok with IV form    Tape Itching and Other (See Comments)    Patient prefers either paper tape or Coban wrap   Tramadol  Nausea And Vomiting    Patient Measurements: Height: 5' 8 (172.7 cm) Weight: 59 kg (130 lb) IBW/kg (Calculated) : 63.9 Heparin  Dosing Weight: 59 kg  Vital Signs: Temp: 97.9 F (36.6 C) (12/06 1604) Temp Source: Oral (12/06 1604) BP: 171/64 (12/06 1700) Pulse Rate: 67 (12/06 1700)  Labs: Recent Labs    10/09/24 1421  HGB 13.7  HCT 42.9  PLT 228  CREATININE 1.29*  TROPONINIHS 105*    Estimated Creatinine Clearance: 29.7 mL/min (A) (by C-G formula based on SCr of 1.29 mg/dL (H)).   Medical History: Past Medical History:  Diagnosis Date   A-fib [facility])    Allergy    CAD (coronary artery disease)    sees Dr. Debby Sor  cardiac stents - 2000   CHF (congestive heart failure) (HCC)    Colon polyps    Complication of anesthesia    rash/hives with caines   Dyspnea    02/12/18  when my heart gets out of rhythm, it has not been out of rhythym- since I have been on Tikosyn  (11/2017)   Dysrhythmia    afib fib   GERD (gastroesophageal reflux disease)    takes OTC- Omeprazole - prn   Heart murmur    History of kidney stones    History of stress test    show normal perfusion without scar or ischemia, post EF 68%   Hx of echocardiogram    show an EF 55%-60% range with grade 1 diastolic dysfunction,  she had mitral anular calcification with mild MR, moderate LA dilation and mild pulmonary hypertension with a PA estimated pressure of 39mm   Hyperlipidemia    Hypertension    NSTEMI (non-ST elevated myocardial infarction) (HCC)    Osteoarthritis    Pacemaker    Pneumonia    hx of 2015    PONV (postoperative nausea and vomiting)    Sleep apnea     Medications:  (Not in a hospital admission)  Scheduled:   amiodarone   200 mg Oral Daily   aspirin  EC  81 mg Oral Daily   empagliflozin   10 mg Oral Daily   furosemide   40 mg Intravenous Once   metoprolol  tartrate  25 mg Oral BID   potassium chloride   60 mEq Oral Once   Infusions:  PRN:   Assessment: 26 yof with a history of AF on eliquis , HLD, HTN, CAD, GERD, HF. Patient is presenting with SOB and chest pain. Heparin  per pharmacy consult placed for chest pain/ACS.  Patient is on apixaban  prior to arrival. Last dose 12/6 am per patient. Will require aPTT monitoring due to likely falsely high anti-Xa level secondary to DOAC use.  Hgb 13.7; plt 228  Goal of Therapy:  Heparin  level 0.3-0.7 units/ml aPTT 66-102 seconds  Monitor platelets by anticoagulation protocol: Yes   Plan:  No initial heparin  bolus Start heparin  infusion at 700 units/hr at 10pm Check aPTT & anti-Xa level in 8 hours and daily while on heparin  Continue to monitor via aPTT until levels are correlated Continue to monitor H&H and platelets  Dorn Buttner, PharmD, BCPS 10/09/2024 5:47 PM ED Clinical Pharmacist -  [phone]

## 2024-10-09 NOTE — Assessment & Plan Note (Signed)
-  chronic avoid nephrotoxic medications such as NSAIDs, Vanco Zosyn combo,  avoid hypotension, continue to follow renal function Recheck renal function after dose of Lasix  given today

## 2024-10-09 NOTE — Assessment & Plan Note (Signed)
 Pt reports yesterday episode of night sweat, she is unclear if she had any fever,  Will monitor for any source of infection

## 2024-10-09 NOTE — Assessment & Plan Note (Signed)
 As per cardiology started on heparin  And added aspirin  81 mg daily continue with Toprol  25 mg p.o. twice daily Echogram in the morning n.p.o. postmidnight in case needs cardiac catheterization

## 2024-10-09 NOTE — Assessment & Plan Note (Signed)
 Check prealbumin order nutrition consult

## 2024-10-09 NOTE — Assessment & Plan Note (Signed)
 Not on statin check lipid panel

## 2024-10-09 NOTE — Consult Note (Signed)
 Cardiology Consultation   Patient ID: Barbara [name] MRN: [id number]; DOB: Mar 24, 1941  Admit date: 10/09/2024 Date of Consult: 10/10/2024  PCP:  Johnny Garnette LABOR, MD   [facility] Providers Cardiologist:  Annabella Scarce, MD  Electrophysiologist:  Soyla Gladis Norton, MD      Patient Profile: Barbara [name] is a 83 y.o. female with a hx of CAD, apical hokum, OSA, sick sinus syndrome s/p PPM, atrial fibrillation, CKD, hypertension, hyperlipidemia who is being seen 10/10/2024 for the evaluation of NSTEMI at the request of Dr. Dean.  History of Present Illness: Barbara [name] is an 83 year old female with above medical history.  Patient previously had PCI to the LAD with bare-metal stent in 2000.  Had a cardiac MRI in 05/2020 that showed apical hypertrophy measuring up to 15 mm consistent with apical HCM.  There is patchy LGE at apex and RV insertion site, normal biventricular size and hyperdynamic systolic function.  Echocardiogram on 05/04/2022 showed EF greater than 75%, no regional wall motion abnormalities, normal RV systolic function, moderate mitral valve regurgitation.  Left heart catheterization on 06/02/2022 showed mild-moderate nonobstructive CAD with patent proximal/mid LAD stent with 30% in-stent restenosis.  Patient had pacemaker implanted on 05/08/2022 for sick sinus syndrome.  Patient was admitted in 05/2024 with CHF.  Treated with IV diuretics.  Of note, during that admission patient was also treated for failure to thrive with loss of appetite, abnormal weight loss, moderate protein malnutrition.  She was again admitted 06/2024 after presenting to the ED with worsening shortness of breath and lower extremity edema.  Found to be in A-fib with RVR.  Heart rate improved with metoprolol .  Treated with IV amiodarone  and converted to sinus rhythm.  Most recent echocardiogram from 07/2024 showed EF 70-75%, no wall motion abnormalities, severe asymmetric LVH of the  apical segment, grade 2 diastolic dysfunction, normal RV systolic function, mild MR.  Patient was seen by her primary care provider on 12/2 with several issues.  Patient had slid out of her recliner a few days prior to her appointment, had low back pain.  She also asked to stop her Lasix  due to frequent urination.  Her lower extremity swelling was felt to be well-controlled so Lasix  was discontinued.  Patient had developed a stage II pressure sore which was being managed through home health.  Patient presented to the ED on 12/6 complaining of chest pain and shortness of breath.  Reportedly had taken 3 nitroglycerin  since the night before.  Initial vital signs showed BP 159/87, oxygen 95% on room air, heart rate 8 bpm.  EKG showed an atrial paced rhythm with LVH.  Labs significant for K3.1, creatinine 1.29, WBC 4.7, hemoglobin 13.7.  Initial troponin 105, repeat pending.   On interview, patient tells me that she has been having episodes of chest pain at night for the  past 3 nights. Usually occurs  around 3 AM. She has been taking nitroglycerin  for this. Pain resolves about 10 minutes after taking nitroglycerin . She also has been short of breath. This has been stable. Admits to not being very active. She spends most of her day sitting in a chair. She has not eaten in 2 days. Reports not having an appetite. She lives by herself. Her son lives in [place], and checks in on her by calling her. Also has a camera in case she falls. She slipped out of her armchair a few  days ago and has  had back pain since then.   Past Medical  History:  Diagnosis Date   A-fib [facility])    Allergy    CAD (coronary artery disease)    sees Dr. Debby Sor  cardiac stents - 2000   CHF (congestive heart failure) (HCC)    Colon polyps    Complication of anesthesia    rash/hives with caines   Dyspnea    02/12/18  when my heart gets out of rhythm, it has not been out of rhythym- since I have been on Tikosyn  (11/2017)   Dysrhythmia     afib fib   GERD (gastroesophageal reflux disease)    takes OTC- Omeprazole - prn   Heart murmur    History of kidney stones    History of stress test    show normal perfusion without scar or ischemia, post EF 68%   Hx of echocardiogram    show an EF 55%-60% range with grade 1 diastolic dysfunction, she had mitral anular calcification with mild MR, moderate LA dilation and mild pulmonary hypertension with a PA estimated pressure of 39mm   Hyperlipidemia    Hypertension    NSTEMI (non-ST elevated myocardial infarction) (HCC)    Osteoarthritis    Pacemaker    Pneumonia    hx of 2015    PONV (postoperative nausea and vomiting)    Sleep apnea     Past Surgical History:  Procedure Laterality Date   CARDIAC CATHETERIZATION     11/2017   cardiac stents  2000   COLONOSCOPY  01-05-14   per Dr. Norleen Hint, clear, no repeats needed    COLONOSCOPY     CORONARY STENT PLACEMENT  2000   in LAD   CYSTOSCOPY WITH RETROGRADE PYELOGRAM, URETEROSCOPY AND STENT PLACEMENT Left 06/11/2022   Procedure: CYSTOSCOPY WITH RETROGRADE PYELOGRAM, LEFT STENT PLACEMENT;  Surgeon: Carolee Sherwood JONETTA DOUGLAS, MD;  Location: [facility];  Service: Urology;  Laterality: Left;   CYSTOSCOPY/URETEROSCOPY/HOLMIUM LASER/STENT PLACEMENT Left 06/25/2022   Procedure: CYSTOSCOPY LEFT URETEROSCOPY/HOLMIUM LASER/STENT PLACEMENT;  Surgeon: Watt Norleen, MD;  Location: [facility];  Service: Urology;  Laterality: Left;  1 HR FOR THIS CASE   DIRECT LARYNGOSCOPY WITH RADIAESSE INJECTION N/A 02/13/2018   Procedure: DIRECT LARYNGOSCOPY WITH RADIAESSE INJECTION;  Surgeon: Carlie Clark, MD;  Location: [facility] OR;  Service: ENT;  Laterality: N/A;   ESOPHAGOGASTRODUODENOSCOPY N/A 03/11/2024   Procedure: EGD (ESOPHAGOGASTRODUODENOSCOPY);  Surgeon: Rosalie Kitchens, MD;  Location: [facility] ENDOSCOPY;  Service: Gastroenterology;  Laterality: N/A;   EYE SURGERY Left    CATARACT REMOVAL   HIP ARTHROPLASTY Left 09/27/2023   Procedure: ARTHROPLASTY BIPOLAR HIP (HEMIARTHROPLASTY);   Surgeon: Beverley Evalene JONETTA, MD;  Location: [facility] OR;  Service: Orthopedics;  Laterality: Left;   KNEE ARTHROSCOPY Left 01/06/2015   Procedure: LEFT KNEE ARTHROSCOPY, abrasion chondroplasty of the medial femerol condryl,medial and lateral menisectomy, microfracture , synovectomy of the suprpatellar pouch;  Surgeon: Tanda Heading, MD;  Location: [facility];  Service: Orthopedics;  Laterality: Left;   LEFT HEART CATH AND CORONARY ANGIOGRAPHY N/A 02/26/2019   Procedure: LEFT HEART CATH AND CORONARY ANGIOGRAPHY;  Surgeon: Sor Debby LABOR, MD;  Location: [facility] INVASIVE CV LAB;  Service: Cardiovascular;  Laterality: N/A;   LEFT HEART CATH AND CORONARY ANGIOGRAPHY N/A 05/06/2022   Procedure: LEFT HEART CATH AND CORONARY ANGIOGRAPHY;  Surgeon: Mady Bruckner, MD;  Location: [facility] INVASIVE CV LAB;  Service: Cardiovascular;  Laterality: N/A;   MICROLARYNGOSCOPY W/VOCAL CORD INJECTION N/A 08/07/2018   Procedure: MICROLARYNGOSCOPY WITH VOCAL CORD INJECTION OF PROLARYN;  Surgeon: Carlie Clark, MD;  Location: [facility] OR;  Service:  ENT;  Laterality: N/A;  JET VENTILATION   PACEMAKER IMPLANT N/A 05/08/2022   Procedure: PACEMAKER IMPLANT;  Surgeon: Inocencio Soyla Lunger, MD;  Location: [facility] INVASIVE CV LAB;  Service: Cardiovascular;  Laterality: N/A;   REVERSE SHOULDER ARTHROPLASTY Right 03/23/2020   Procedure: REVERSE SHOULDER ARTHROPLASTY;  Surgeon: Sharl Selinda Dover, MD;  Location: [facility] OR;  Service: Orthopedics;  Laterality: Right;   VAGINAL HYSTERECTOMY  1971     Scheduled Meds:  amiodarone   200 mg Oral Daily   aspirin  EC  81 mg Oral Daily   empagliflozin   10 mg Oral Daily   metoprolol  tartrate  25 mg Oral BID   pantoprazole   40 mg Oral Daily   sodium chloride  flush  3 mL Intravenous Q12H   sucralfate   1 g Oral BID   thiamine  (VITAMIN B1) injection  100 mg Intravenous Daily   Continuous Infusions:  sodium chloride      heparin  700 Units/hr (10/10/24 0500)   PRN Meds: sodium chloride , acetaminophen  **OR** acetaminophen ,  albuterol , alum & mag hydroxide-simeth, loratadine , ondansetron  **OR** ondansetron  (ZOFRAN ) IV, sodium chloride  flush  Allergies:    Allergies  Allergen Reactions   Lidocaine  Anaphylaxis   Morphine  Other (See Comments)    Body shuts down   Procaine Hcl Anaphylaxis, Rash and Other (See Comments)    Anything with 'caine' in it    Sulfonamide Derivatives Hives   Amlodipine  Swelling   Ezetimibe-Simvastatin Other (See Comments)    VYTORIN = Joint pain   Latex Itching and Other (See Comments)    WELTS   Norco [Hydrocodone -Acetaminophen ] Nausea And Vomiting and Other (See Comments)    States does ok with IV form    Tape Itching and Other (See Comments)    Patient prefers either paper tape or Coban wrap   Tramadol  Nausea And Vomiting    Social History:   Social History   Socioeconomic History   Marital status: Widowed    Spouse name: Not on file   Number of children: 2   Years of education: college   Highest education level: High school graduate  Occupational History   Occupation: Retired  Tobacco Use   Smoking status: Former    Current packs/day: 0.00    Types: Cigarettes    Start date: 11/04/1956    Quit date: 11/04/1996    Years since quitting: 27.9   Smokeless tobacco: Never  Vaping Use   Vaping status: Never Used  Substance and Sexual Activity   Alcohol use: No    Alcohol/week: 0.0 standard drinks of alcohol   Drug use: No   Sexual activity: Not Currently  Other Topics Concern   Not on file  Social History Narrative   Not on file   Social Drivers of Health   Financial Resource Strain: Low Risk  (08/05/2022)   Overall Financial Resource Strain (CARDIA)    Difficulty of Paying Living Expenses: Not hard at all  Food Insecurity: No Food Insecurity (10/09/2024)   Hunger Vital Sign    Worried About Running Out of Food in the Last Year: Never true    Ran Out of Food in the Last Year: Never true  Transportation Needs: Unmet Transportation Needs (06/20/2024)   PRAPARE  - Transportation    Lack of Transportation (Medical): Yes    Lack of Transportation (Non-Medical): Yes  Physical Activity: Insufficiently Active (07/02/2022)   Exercise Vital Sign    Days of Exercise per Week: 1 day    Minutes of Exercise per Session: 20 min  Stress:  No Stress Concern Present (07/02/2022)   [employer] of Occupational Health - Occupational Stress Questionnaire    Feeling of Stress : Not at all  Social Connections: Moderately Integrated (06/20/2024)   Social Connection and Isolation Panel    Frequency of Communication with Friends and Family: More than three times a week    Frequency of Social Gatherings with Friends and Family: Once a week    Attends Religious Services: More than 4 times per year    Active Member of [employer] or Organizations: Yes    Attends Banker Meetings: Never    Marital Status: Widowed  Intimate Partner Violence: Not At Risk (10/09/2024)   Humiliation, Afraid, Rape, and Kick questionnaire    Fear of Current or Ex-Partner: No    Emotionally Abused: No    Physically Abused: No    Sexually Abused: No    Family History:   Family History  Problem Relation Age of Onset   Cancer Maternal Grandmother    Heart disease Maternal Grandfather      ROS:  Please see the history of present illness.  All other ROS reviewed and negative.     Physical Exam/Data: Vitals:   10/09/24 2042 10/10/24 0000 10/10/24 0200 10/10/24 0334  BP: (!) 149/85 112/65 (!) 128/52   Pulse: 61 60 60 60  Resp: 20 18 20  (!) 34  Temp: 98.2 F (36.8 C) (!) 97.4 F (36.3 C)  99 F (37.2 C)  TempSrc: Oral Axillary    SpO2: 94% 95% 96% 94%  Weight: 61.5 kg   60.1 kg  Height: 5' 8 (1.727 m)       Intake/Output Summary (Last 24 hours) at 10/10/2024 0736 Last data filed at 10/10/2024 0500 Gross per 24 hour  Intake 1476.46 ml  Output 2000 ml  Net -523.54 ml      10/10/2024    3:34 AM 10/09/2024    8:42 PM 10/09/2024    2:09 PM  Last 3 Weights  Weight (lbs)  132 lb 7.9 oz 135 lb 9.3 oz 130 lb  Weight (kg) 60.1 kg 61.5 kg 58.968 kg     Body mass index is 20.15 kg/m.  General:  Thin, elderly female. Sitting upright in the bed. No acute distress  HEENT: normal Neck: no JVD Vascular: No carotid bruits; Distal pulses 2+ bilaterally Cardiac:  normal S1, S2; RRR; no murmur  Lungs: Crackles at bases bilaterally Abd: soft, nontender, no hepatomegaly  Ext: 2+ pitting edema of ankles bilaterally Musculoskeletal:  No deformities, BUE and BLE strength normal and equal Skin: warm and dry  Neuro:  CNs 2-12 intact, no focal abnormalities noted Psych:  Normal affect   EKG:  The EKG was personally reviewed and demonstrates: V-paced rhythm Telemetry:  Telemetry was personally reviewed and demonstrates: AV paced rhythm  Relevant CV Studies:   Laboratory Data: High Sensitivity Troponin:   Recent Labs  Lab 10/09/24 1702 10/09/24 1832 10/09/24 2010 10/09/24 2135 10/10/24 0332  TROPONINIHS 71* 85* 60* 80* 78*     Chemistry Recent Labs  Lab 10/05/24 1546 10/09/24 1421 10/09/24 2010  NA 140 139  --   K 3.8 3.1*  --   CL 107 109  --   CO2 23 21*  --   GLUCOSE 79 96  --   BUN 24* 20  --   CREATININE 1.22* 1.29*  --   CALCIUM  10.3 10.3  --   MG  --   --  1.2*  GFRNONAA  --  41*  --   ANIONGAP  --  9  --     Recent Labs  Lab 10/05/24 1546  PROT 6.7  ALBUMIN 3.3*  AST 30  ALT 18  ALKPHOS 108  BILITOT 1.2   Lipids No results for input(s): CHOL, TRIG, HDL, LABVLDL, LDLCALC, CHOLHDL in the last 168 hours.  Hematology Recent Labs  Lab 10/05/24 1546 10/09/24 1421  WBC 5.3 4.7  RBC 4.63 4.87  HGB 13.1 13.7  HCT 40.3 42.9  MCV 87.1 88.1  MCH  --  28.1  MCHC 32.5 31.9  RDW 18.2* 17.9*  PLT 129.0* 228   Thyroid   Recent Labs  Lab 10/09/24 2010  TSH 2.053    BNPNo results for input(s): BNP, PROBNP in the last 168 hours.  DDimer No results for input(s): DDIMER in the last 168 hours.  Radiology/Studies:  DG  Chest 2 View Result Date: 10/09/2024 CLINICAL DATA:  Low back pain after fall.  Chest pain. EXAM: CHEST - 2 VIEW, LUMBAR SPINE COMPARISON:  06/20/2024, 07/03/2024. FINDINGS: Chest: The heart is enlarged and the mediastinal contour is within normal limits. A dual lead pacemaker is present over the left chest. There is eventration of the posterior left diaphragm with a associated atelectasis. No consolidation, effusion, or pneumothorax is seen. Degenerative changes are present in the thoracic spine. Shoulder arthroplasty changes are noted on the right. Lumbar spine: There is no evidence of acute fracture in the lumbar spine. Alignment appears normal. Multilevel intervertebral disc space narrowing, degenerative endplate changes, and facet arthropathy are noted. There is atherosclerotic calcification of the aorta. 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 IMPRESSION: 1. No active cardiopulmonary disease. 2. No acute fracture or subluxation in the lumbar spine. Electronically Signed   By: Leita Birmingham M.D.   On: 10/09/2024 15:18     Assessment and Plan:  NSTEMI  - Had BMS to LAD in 2000  - Cath in 05/2022 showed mild-moderate nonobstructive CAD, patent LAD stent  - Now presents with episodes of chest pain at night. Relieved with nitroglycerin >> she had a similar admission back in August 25 with mainly chest pain at night and bump in high-sensitivity troponin to the 400s - hsTn 105. Repeat pending - EKG nonischemic  - Patient has complained of similar chest pain during multiple admissions.  Patient would  likely benefit from cath for definitive evaluation - Currently pain-free - Start IV heparin  per pharmacy consult  - Check 2D echo to assess LV function and rule out new focal wall motion abnormalities - Continue Lopressor  25 mg twice daily  - Start ASA 81 mg daily - Given this is her second admission in several months for complaints of chest pain at night and some during the day with  exertion and a mildly elevated troponin, I think we need to plan left heart cath to define coronary anatomy given that she did have mild to moderate CAD 2 years ago and may have had progression of her disease  Acute on Chronic HFpEF  Apical variant HCM  - Echocardiogram from 07/2024 showed EF 70-75%, severe asymmetric LVH, normal RV systolic function, mildMR - Patient recently asked her PCP to stop lasix . It was discontinued on 12/2 - Now presents with chest pain, ongoing shortness of breath - She appears volume overloaded on exam with crackles at the bases and pedal edema despite chest x-ray being clear. - Check BNP - Will Lasix  40 mg IV and reassess in the a.m. - Ordered strict I/Os,  daily weights  - Will assess response to lasix  and renal function in AM before reordering  lasix  for tomorrow  - Check 2D echo - Continue Lopressor  25 mg twice daily  Atrial Fibrillation  SSS s/p PPM   - Maintaining AV paced rhythm on telemetry and EKG  - continue Amio 200mg  BID and Lopressor  25mg  BID - Eliquis  and will start IV heparin  per pharmacy  Otherwise per primary  - Weakness, lack of appetite, decreased PO intake  - Pressure sores  - Recent fall with low back pain   Risk Assessment/Risk Scores:    TIMI Risk Score for Unstable Angina or Non-ST Elevation MI:   The patient's TIMI risk score is 4, which indicates a 20% risk of all cause mortality, new or recurrent myocardial infarction or need for urgent revascularization in the next 14 days.  New York  Heart Association (NYHA) Functional Class NYHA Class II  CHA2DS2-VASc Score = 5   This indicates a 7.2% annual risk of stroke. The patient's score is based upon: CHF History: 0 HTN History: 1 Diabetes History: 0 Stroke History: 0 Vascular Disease History: 1 Age Score: 2 Gender Score: 1        For questions or updates, please contact [facility] Please consult [email or web address] for contact info under  Signed, Wilbert Bihari, MD   10/09/2024 5:15PM

## 2024-10-09 NOTE — Consult Note (Signed)
 WOC Nurse Consult Note: Reason for Consult:pressure ulcer  Wound type:Deep Tissue Pressure Injury dark purple discoloration with one pink dry open wound and scattered tan nonviable tissue present  Pressure Injury POA: Yes Measurement: see nursing flowsheet  Wound bed: as above Drainage (amount, consistency, odor) see nursing flowsheet  Periwound: Dressing procedure/placement/frequency: Cleanse sacral wound with soap and water , dry and apply Xeroform gauze ([place] [phone]) to area daily. Secure with silicone foam.   POC discussed with bedside nurse. WOC team will not follow.  Deep Tissue Pressure Injuries are high risk to deteriorate.  Reconsult if develops necrotic tissue/worsening appearance.   Thank you,    Powell Bar MSN, RN-BC, [employer]

## 2024-10-09 NOTE — H&P (Signed)
 Barbara [name] FMW:[id number] DOB: 06/01/1941 DOA: 10/09/2024     PCP: Johnny Garnette LABOR, MD   Outpatient Specialists:  CARDS:   Dr. Annabella Scarce, MD   Patient arrived to ER on 10/09/24 at 1402 Referred by Attending Dean Clarity, MD   Patient coming from:    home Lives alone,    Chief Complaint:   Chief Complaint  Patient presents with   Chest Pain   Shortness of Breath    HPI: Barbara [name] is a 83 y.o. female with medical history significant of  CAD, a.fib on eliquis , HTN, HLD, GERD, CKD3a, SSS sp pacemaker    Presented with  CP and SOB Presents with CP and SOB for the past 2 days. She has been using nitroglycerine with some help Known hx of CAD  Patient lives at home and has not had good PO for the past few days  [facility] also have had falls and Xray done of her back recently In ER trop elevated at 105 cardiology rec heparin  and possible cardiac cath in AM    She recently stopped lasix  due to frequent urination  Denies significant ETOH intake   Does not smoke      Regarding pertinent Chronic problems:     Hyperlipidemia - not on statins   Lipid Panel     Component Value Date/Time   CHOL 102 04/26/2024 1240   TRIG 170 (H) 04/26/2024 1240   HDL 21 (L) 04/26/2024 1240   CHOLHDL 4.9 (H) 04/26/2024 1240   CHOLHDL 3 06/13/2021 1459   VLDL 48.8 (H) 06/13/2021 1459   LDLCALC 52 04/26/2024 1240   LDLDIRECT 54.0 06/13/2021 1459   LABVLDL 29 04/26/2024 1240     HTN on imdur , cozaar , metoprolol    chronic CHF diastolic - last echo  Recent Results (from the past 56199 hours)  ECHOCARDIOGRAM COMPLETE   Collection Time: 07/27/24  3:24 PM  Result Value   Area-P 1/2 3.60   S' Lateral 2.00   Est EF 70 - 75%   Narrative      ECHOCARDIOGRAM REPORT     IMPRESSIONS    1. Left ventricular ejection fraction, by estimation, is 70 to 75%. The left ventricle has hyperdynamic function. The left ventricle has no regional wall motion abnormalities. There  is severe asymmetric left ventricular hypertrophy of the apical  segment. Left ventricular diastolic parameters are consistent with Grade II diastolic dysfunction (pseudonormalization). Elevated left atrial pressure.  2. Right ventricular systolic function is normal. The right ventricular size is normal. There is normal pulmonary artery systolic pressure. The estimated right ventricular systolic pressure is 25.1 mmHg.  3. Left atrial size was moderately dilated.  4. The mitral valve is normal in structure. Mild mitral valve regurgitation. No evidence of mitral stenosis.  5. The aortic valve is tricuspid. Aortic valve regurgitation is not visualized. Aortic valve sclerosis/calcification is present, without any evidence of aortic stenosis.  6. The inferior vena cava is normal in size with greater than 50% respiratory variability, suggesting right atrial pressure of 3 mmHg.  FINDINGS           CAD  - On  betablocker                 -  followed by cardiology                -   cardiac cath 2019 sp stents  Cath 2020 BMS to LAD   Cardiac cath 2023 showed mild-moderate nonobstructive  CAD, patent LAD stent        A. Fib -   atrial fibrillation CHA2DS2 vas score   7     current  on anticoagulation with  Eliquis ,    -  Rate control:  Currently controlled with  Metoprolol ,         - Rhythm control:   amiodarone ,     CKD stage IIIa baseline Cr 1.2 Estimated Creatinine Clearance: 29.7 mL/min (A) (by C-G formula based on SCr of 1.29 mg/dL (H)).  Lab Results  Component Value Date   CREATININE 1.29 (H) 10/09/2024   CREATININE 1.22 (H) 10/05/2024   CREATININE 1.31 (H) 07/03/2024   Lab Results  Component Value Date   NA 139 10/09/2024   CL 109 10/09/2024   K 3.1 (L) 10/09/2024   CO2 21 (L) 10/09/2024   BUN 20 10/09/2024   CREATININE 1.29 (H) 10/09/2024   GFRNONAA 41 (L) 10/09/2024   CALCIUM  10.3 10/09/2024   PHOS 2.7 06/21/2024   ALBUMIN 3.3 (L) 10/05/2024   GLUCOSE 96 10/09/2024     stage II pressure sore which was being managed through home health.   While in ER:   Cardiology recommended continue metoprolol  25 mg twice daily can start aspirin  81 mg daily ordered echogram started heparin  give a dose of Lasix  40 mg IV     Lab Orders         Resp panel by RT-PCR (RSV, Flu A&B, Covid) Anterior Nasal Swab         Basic metabolic panel         CBC         Urinalysis, Routine w reflex microscopic -Urine, Clean Catch         Magnesium          APTT         APTT         APTT         Heparin  level (unfractionated)         Heparin  level (unfractionated)         CK         Heparin  level (unfractionated)      Lumbar spine 10/05/2024 - Multilevel degenerative changes in the lumbar spine without evidence of acute fracture.  CXR -  NON acute   Following Medications were ordered in ER: Medications  furosemide  (LASIX ) injection 40 mg (has no administration in time range)  amiodarone  (PACERONE ) tablet 200 mg (has no administration in time range)  empagliflozin  (JARDIANCE ) tablet 10 mg (has no administration in time range)  metoprolol  tartrate (LOPRESSOR ) tablet 25 mg (has no administration in time range)  aspirin  EC tablet 81 mg (has no administration in time range)  sodium chloride  0.9 % bolus 1,000 mL (has no administration in time range)  heparin  ADULT infusion 100 units/mL (25000 units/250mL) (has no administration in time range)  potassium chloride  (KLOR-CON ) packet 60 mEq (60 mEq Oral Given 10/09/24 1842)    _______________________________________________________ ER Provider Called:    Cardiology    Dr. Emilee They Recommend admit to medicine    SEEN in ER     ED Triage Vitals  Encounter Vitals Group     BP 10/09/24 1406 (!) 159/87     Girls Systolic BP Percentile --      Girls Diastolic BP Percentile --      Boys Systolic BP Percentile --      Boys Diastolic BP Percentile --      Pulse Rate 10/09/24  1406 78     Resp 10/09/24 1406 18     Temp  10/09/24 1604 97.9 F (36.6 C)     Temp Source 10/09/24 1604 Oral     SpO2 10/09/24 1406 95 %     Weight 10/09/24 1409 130 lb (59 kg)     Height 10/09/24 1409 5' 8 (1.727 m)     Head Circumference --      Peak Flow --      Pain Score 10/09/24 1409 8     Pain Loc --      Pain Education --      Exclude from Growth Chart --   UFJK(75)@     _________________________________________ Significant initial  Findings: Abnormal Labs Reviewed  BASIC METABOLIC PANEL WITH GFR - Abnormal; Notable for the following components:      Result Value   Potassium 3.1 (*)    CO2 21 (*)    Creatinine, Ser 1.29 (*)    GFR, Estimated 41 (*)    All other components within normal limits  CBC - Abnormal; Notable for the following components:   RDW 17.9 (*)    All other components within normal limits  URINALYSIS, ROUTINE W REFLEX MICROSCOPIC - Abnormal; Notable for the following components:   APPearance HAZY (*)    Glucose, UA >=500 (*)    Protein, ur 100 (*)    Leukocytes,Ua TRACE (*)    Bacteria, UA RARE (*)    All other components within normal limits  TROPONIN I (HIGH SENSITIVITY) - Abnormal; Notable for the following components:   Troponin I (High Sensitivity) 105 (*)    All other components within normal limits  TROPONIN I (HIGH SENSITIVITY) - Abnormal; Notable for the following components:   Troponin I (High Sensitivity) 71 (*)    All other components within normal limits      _________________________ Troponin  ordered Cardiac Panel (last 3 results) Recent Labs    10/09/24 1421 10/09/24 1702  TROPONINIHS 105* 71*     ECG: Ordered Personally reviewed and interpreted by me showing: HR : 61 Rhythm:  Paced  , no evidence of ischemic changes Left ventricular hypertrophy with repolarization abnormality QTC 463  BNP (last 3 results) Recent Labs    05/18/24 1132 06/20/24 1538  BNP 852.3* 980.3*       The recent clinical data is shown below. Vitals:   10/09/24 1604 10/09/24  1618 10/09/24 1700 10/09/24 1835  BP:   (!) 171/64 (!) 114/95  Pulse:   67 (!) 59  Resp:   (!) 29 (!) 22  Temp: 97.9 F (36.6 C)   97.6 F (36.4 C)  TempSrc: Oral   Oral  SpO2:  98% 98% 94%  Weight:      Height:          WBC     Component Value Date/Time   WBC 4.7 10/09/2024 1421   LYMPHSABS 0.7 10/05/2024 1546   LYMPHSABS 0.5 (L) 04/26/2024 1240   MONOABS 0.4 10/05/2024 1546   EOSABS 0.1 10/05/2024 1546   EOSABS 0.1 04/26/2024 1240   BASOSABS 0.0 10/05/2024 1546   BASOSABS 0.0 04/26/2024 1240          UA   no evidence of UTI    Urine analysis:    Component Value Date/Time   COLORURINE YELLOW 10/09/2024 1607   APPEARANCEUR HAZY (A) 10/09/2024 1607   LABSPEC 1.028 10/09/2024 1607   PHURINE 5.0 10/09/2024 1607   GLUCOSEU >=500 (A) 10/09/2024 1607  GLUCOSEU NEGATIVE 09/13/2021 1203   HGBUR NEGATIVE 10/09/2024 1607   HGBUR negative 10/17/2010 0859   BILIRUBINUR NEGATIVE 10/09/2024 1607   BILIRUBINUR neg 08/01/2021 1037   KETONESUR NEGATIVE 10/09/2024 1607   PROTEINUR 100 (A) 10/09/2024 1607   UROBILINOGEN 0.2 09/13/2021 1203   NITRITE NEGATIVE 10/09/2024 1607   LEUKOCYTESUR TRACE (A) 10/09/2024 1607    Results for orders placed or performed during the hospital encounter of 10/09/24  Resp panel by RT-PCR (RSV, Flu A&B, Covid) Anterior Nasal Swab     Status: None   Collection Time: 10/09/24  4:10 PM   Specimen: Anterior Nasal Swab  Result Value Ref Range Status   SARS Coronavirus 2 by RT PCR NEGATIVE NEGATIVE Final   Influenza A by PCR NEGATIVE NEGATIVE Final   Influenza B by PCR NEGATIVE NEGATIVE Final         Resp Syncytial Virus by PCR NEGATIVE NEGATIVE Final          __________________________________________________________ Recent Labs  Lab 10/05/24 1546 10/09/24 1421  NA 140 139  K 3.8 3.1*  CO2 23 21*  GLUCOSE 79 96  BUN 24* 20  CREATININE 1.22* 1.29*  CALCIUM  10.3 10.3    Cr   stable,    Lab Results  Component Value Date   CREATININE  1.29 (H) 10/09/2024   CREATININE 1.22 (H) 10/05/2024   CREATININE 1.31 (H) 07/03/2024    Recent Labs  Lab 10/05/24 1546  AST 30  ALT 18  ALKPHOS 108  BILITOT 1.2  PROT 6.7  ALBUMIN 3.3*   Lab Results  Component Value Date   CALCIUM  10.3 10/09/2024   PHOS 2.7 06/21/2024        Plt: Lab Results  Component Value Date   PLT 228 10/09/2024         Recent Labs  Lab 10/05/24 1546 10/09/24 1421  WBC 5.3 4.7  NEUTROABS 4.1  --   HGB 13.1 13.7  HCT 40.3 42.9  MCV 87.1 88.1  PLT 129.0* 228    HG/HCT  stable,     Component Value Date/Time   HGB 13.7 10/09/2024 1421   HGB 11.1 04/26/2024 1240   HCT 42.9 10/09/2024 1421   HCT 36.2 04/26/2024 1240   MCV 88.1 10/09/2024 1421   MCV 89 04/26/2024 1240        _______________________________________________ Hospitalist was called for admission for   NSTEMI (non-ST elevated myocardial infarction)   Hypokalemia  FTT    The following Work up has been ordered so far:  Orders Placed This Encounter  Procedures   Resp panel by RT-PCR (RSV, Flu A&B, Covid) Anterior Nasal Swab   DG Chest 2 View   Basic metabolic panel   CBC   Urinalysis, Routine w reflex microscopic -Urine, Clean Catch   Magnesium    APTT   APTT   APTT   Heparin  level (unfractionated)   Heparin  level (unfractionated)   CK   Heparin  level (unfractionated)   Diet Heart Room service appropriate? Yes; Fluid consistency: Thin   Document Height and Actual Weight   ED Cardiac monitoring   Strict intake and output   Daily weights   Inpatient consult to Cardiology   heparin  per pharmacy consult   Consult to hospitalist   ED EKG   EKG 12-Lead   ECHOCARDIOGRAM COMPLETE   Saline lock IV     OTHER Significant initial  Findings:  labs showing:     DM  labs:  HbA1C: Recent Labs    03/08/24  1519 10/05/24 1546  HGBA1C 5.5 5.8       CBG (last 3)  No results for input(s): GLUCAP in the last 72 hours.        Cultures:    Component Value  Date/Time   SDES  06/11/2022 1721    CYSTOSCOPY Performed at [facility], [address], [place], [place] [address]    SPECREQUEST  06/11/2022 1721    NONE Performed at [facility], [address], [place], [place] [address]    CULT  06/11/2022 1721    NO GROWTH Performed at [facility], [address], [place], [place] [address]    REPTSTATUS 06/12/2022 FINAL 06/11/2022 1721     Radiological Exams on Admission: DG Chest 2 View Result Date: 10/09/2024 CLINICAL DATA:  Low back pain after fall.  Chest pain. EXAM: CHEST - 2 VIEW, LUMBAR SPINE COMPARISON:  06/20/2024, 07/03/2024. FINDINGS: Chest: The heart is enlarged and the mediastinal contour is within normal limits. A dual lead pacemaker is present over the left chest. There is eventration of the posterior left diaphragm with a associated atelectasis. No consolidation, effusion, or pneumothorax is seen. Degenerative changes are present in the thoracic spine. Shoulder arthroplasty changes are noted on the right. Lumbar spine: There is no evidence of acute fracture in the lumbar spine. Alignment appears normal. Multilevel intervertebral disc space narrowing, degenerative endplate changes, and facet arthropathy are noted. There is atherosclerotic calcification of the aorta. 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 IMPRESSION: 1. No active cardiopulmonary disease. 2. No acute fracture or subluxation in the lumbar spine. Electronically Signed   By: Leita Birmingham M.D.   On: 10/09/2024 15:18   _______________________________________________________________________________________________________ Latest  Blood pressure (!) 114/95, pulse (!) 59, temperature 97.6 F (36.4 C), temperature source Oral, resp. rate (!) 22, height 5' 8 (1.727 m), weight 59 kg, SpO2 94%.   Vitals  labs and radiology finding personally reviewed  Review of Systems:    Pertinent positives include: fatigue,  weight loss  chest pain,  Constitutional:  No weight loss, night sweats, Fevers, chills,   HEENT:  No headaches, Difficulty swallowing,Tooth/dental problems,Sore throat,  No sneezing, itching, ear ache, nasal congestion, post nasal drip,  Cardio-vascular:  No Orthopnea, PND, anasarca, dizziness, palpitations.no Bilateral lower extremity swelling  GI:  No heartburn, indigestion, abdominal pain, nausea, vomiting, diarrhea, change in bowel habits, loss of appetite, melena, blood in stool, hematemesis Resp:  no shortness of breath at rest. No dyspnea on exertion, No excess mucus, no productive cough, No non-productive cough, No coughing up of blood.No change in color of mucus.No wheezing. Skin:  no rash or lesions. No jaundice GU:  no dysuria, change in color of urine, no urgency or frequency. No straining to urinate.  No flank pain.  Musculoskeletal:  No joint pain or no joint swelling. No decreased range of motion. No back pain.  Psych:  No change in mood or affect. No depression or anxiety. No memory loss.  Neuro: no localizing neurological complaints, no tingling, no weakness, no double vision, no gait abnormality, no slurred speech, no confusion  All systems reviewed and apart from HOPI all are negative _______________________________________________________________________________________________ Past Medical History:   Past Medical History:  Diagnosis Date   A-fib [facility])    Allergy    CAD (coronary artery disease)    sees Dr. Debby Sor  cardiac stents - 2000   CHF (congestive heart failure) (HCC)    Colon polyps  Complication of anesthesia    rash/hives with caines   Dyspnea    02/12/18  when my heart gets out of rhythm, it has not been out of rhythym- since I have been on Tikosyn  (11/2017)   Dysrhythmia    afib fib   GERD (gastroesophageal reflux disease)    takes OTC- Omeprazole - prn   Heart murmur    History of kidney stones    History of stress test    show  normal perfusion without scar or ischemia, post EF 68%   Hx of echocardiogram    show an EF 55%-60% range with grade 1 diastolic dysfunction, she had mitral anular calcification with mild MR, moderate LA dilation and mild pulmonary hypertension with a PA estimated pressure of 39mm   Hyperlipidemia    Hypertension    NSTEMI (non-ST elevated myocardial infarction) (HCC)    Osteoarthritis    Pacemaker    Pneumonia    hx of 2015    PONV (postoperative nausea and vomiting)    Sleep apnea       Past Surgical History:  Procedure Laterality Date   CARDIAC CATHETERIZATION     11/2017   cardiac stents  2000   COLONOSCOPY  01-05-14   per Dr. Norleen Hint, clear, no repeats needed    COLONOSCOPY     CORONARY STENT PLACEMENT  2000   in LAD   CYSTOSCOPY WITH RETROGRADE PYELOGRAM, URETEROSCOPY AND STENT PLACEMENT Left 06/11/2022   Procedure: CYSTOSCOPY WITH RETROGRADE PYELOGRAM, LEFT STENT PLACEMENT;  Surgeon: Carolee Sherwood JONETTA DOUGLAS, MD;  Location: [facility];  Service: Urology;  Laterality: Left;   CYSTOSCOPY/URETEROSCOPY/HOLMIUM LASER/STENT PLACEMENT Left 06/25/2022   Procedure: CYSTOSCOPY LEFT URETEROSCOPY/HOLMIUM LASER/STENT PLACEMENT;  Surgeon: Watt Norleen, MD;  Location: [facility];  Service: Urology;  Laterality: Left;  1 HR FOR THIS CASE   DIRECT LARYNGOSCOPY WITH RADIAESSE INJECTION N/A 02/13/2018   Procedure: DIRECT LARYNGOSCOPY WITH RADIAESSE INJECTION;  Surgeon: Carlie Clark, MD;  Location: [facility] OR;  Service: ENT;  Laterality: N/A;   ESOPHAGOGASTRODUODENOSCOPY N/A 03/11/2024   Procedure: EGD (ESOPHAGOGASTRODUODENOSCOPY);  Surgeon: Rosalie Kitchens, MD;  Location: [facility] ENDOSCOPY;  Service: Gastroenterology;  Laterality: N/A;   EYE SURGERY Left    CATARACT REMOVAL   HIP ARTHROPLASTY Left 09/27/2023   Procedure: ARTHROPLASTY BIPOLAR HIP (HEMIARTHROPLASTY);  Surgeon: Beverley Evalene JONETTA, MD;  Location: [facility] OR;  Service: Orthopedics;  Laterality: Left;   KNEE ARTHROSCOPY Left 01/06/2015   Procedure: LEFT KNEE ARTHROSCOPY,  abrasion chondroplasty of the medial femerol condryl,medial and lateral menisectomy, microfracture , synovectomy of the suprpatellar pouch;  Surgeon: Tanda Heading, MD;  Location: [facility];  Service: Orthopedics;  Laterality: Left;   LEFT HEART CATH AND CORONARY ANGIOGRAPHY N/A 02/26/2019   Procedure: LEFT HEART CATH AND CORONARY ANGIOGRAPHY;  Surgeon: Burnard Debby LABOR, MD;  Location: [facility] INVASIVE CV LAB;  Service: Cardiovascular;  Laterality: N/A;   LEFT HEART CATH AND CORONARY ANGIOGRAPHY N/A 05/06/2022   Procedure: LEFT HEART CATH AND CORONARY ANGIOGRAPHY;  Surgeon: Mady Bruckner, MD;  Location: [facility]VAS[facility] CV LAB;  Service: Cardiovascular;  Laterality: N/A;   MICROLARYNGOSCOPY W/VOCAL CORD INJECTION N/A 08/07/2018   Procedure: MICROLARYNGOSCOPY WITH VOCAL CORD INJECTION OF PROLARYN;  Surgeon: Carlie Clark, MD;  Location: [facility] OR;  Service: ENT;  Laterality: N/A;  JET VENTILATION   PACEMAKER IMPLANT N/A 05/08/2022   Procedure: PACEMAKER IMPLANT;  Surgeon: Inocencio Soyla Lunger, MD;  Location: [facility] CV LAB;  Service: Cardiovascular;  Laterality: N/A;   REVERSE SHOULDER ARTHROPLASTY Right 03/23/2020  Procedure: REVERSE SHOULDER ARTHROPLASTY;  Surgeon: Sharl Selinda Dover, MD;  Location: [facility] OR;  Service: Orthopedics;  Laterality: Right;   VAGINAL HYSTERECTOMY  1971    Social History:  Ambulatory   cane, walker       reports that she quit smoking about 27 years ago. Her smoking use included cigarettes. She started smoking about 67 years ago. She has never used smokeless tobacco. She reports that she does not drink alcohol and does not use drugs.   Family History:   Family History  Problem Relation Age of Onset   Cancer Maternal Grandmother    Heart disease Maternal Grandfather    ______________________________________________________________________________________________ Allergies: Allergies  Allergen Reactions   Lidocaine  Anaphylaxis   Morphine  Other (See Comments)    Body shuts  down   Procaine Hcl Anaphylaxis, Rash and Other (See Comments)    Anything with 'caine' in it    Sulfonamide Derivatives Hives   Amlodipine  Swelling   Ezetimibe-Simvastatin Other (See Comments)    VYTORIN = Joint pain   Latex Itching and Other (See Comments)    WELTS   Norco [Hydrocodone -Acetaminophen ] Nausea And Vomiting and Other (See Comments)    States does ok with IV form    Tape Itching and Other (See Comments)    Patient prefers either paper tape or Coban wrap   Tramadol  Nausea And Vomiting     Prior to Admission medications   Medication Sig Start Date End Date Taking? Authorizing Provider  acetaminophen  (TYLENOL ) 325 MG tablet Take 2 tablets (650 mg total) by mouth every 6 (six) hours as needed for mild pain (pain score 1-3) or fever (or Fever >/= 101). 05/22/24   Sebastian Toribio GAILS, MD  alum & mag hydroxide-simeth (MAALOX/MYLANTA) 200-200-20 MG/5ML suspension Take 30 mLs by mouth every 4 (four) hours as needed for indigestion. 09/30/23   Leotis Bogus, MD  amiodarone  (PACERONE ) 200 MG tablet Take 1 tablet (200 mg total) by mouth daily. 08/02/24   Kate Lonni CROME, MD  apixaban  (ELIQUIS ) 2.5 MG TABS tablet Take 1 tablet (2.5 mg total) by mouth 2 (two) times daily. 01/21/24   Johnny Garnette LABOR, MD  cyanocobalamin  (VITAMIN B12) 500 MCG tablet Take 500 mcg by mouth daily.    [provider]  empagliflozin  (JARDIANCE ) 10 MG TABS tablet Take 1 tablet (10 mg total) by mouth daily. 01/21/24   Johnny Garnette LABOR, MD  ipratropium (ATROVENT ) 0.06 % nasal spray Place 2 sprays into both nostrils 2 (two) times daily as needed (allergies). 04/28/18   [provider]  isosorbide  mononitrate (IMDUR ) 30 MG 24 hr tablet Take 1 tablet (30 mg total) by mouth daily. 02/24/24   Johnny Garnette LABOR, MD  ketoconazole  (NIZORAL ) 2 % cream Apply 1 Application topically 2 (two) times daily as needed for irritation. 12/26/23   Johnny Garnette LABOR, MD  loratadine  (CLARITIN ) 10 MG tablet Take 10 mg by  mouth daily as needed for allergies.    [provider]  losartan  (COZAAR ) 50 MG tablet Take 1 tablet (50 mg total) by mouth daily. 10/04/24   Johnny Garnette LABOR, MD  megestrol  (MEGACE ) 40 MG tablet Take 1 tablet (40 mg total) by mouth in the morning. 04/30/24   Johnny Garnette LABOR, MD  metoprolol  tartrate (LOPRESSOR ) 25 MG tablet Take 1 tablet (25 mg total) by mouth 2 (two) times daily. 07/23/24   Johnny Garnette LABOR, MD  mirtazapine  (REMERON ) 15 MG tablet Take 1 tablet (15 mg total) by mouth at bedtime. 05/04/24  Johnny Garnette LABOR, MD  Multiple Vitamin (MULTIVITAMIN WITH MINERALS) TABS tablet Take 1 tablet by mouth daily. 06/27/24   Lue Elsie BROCKS, MD  nitroGLYCERIN  (NITROSTAT ) 0.4 MG SL tablet Place 1 tablet (0.4 mg total) under the tongue every 5 (five) minutes as needed for chest pain. 04/06/24   Johnny Garnette LABOR, MD  Nutritional Supplements (BOOST/FIBER PO) Take 1 Bottle by mouth 3 (three) times daily.    [provider]  pantoprazole  (PROTONIX ) 40 MG tablet Take 1 tablet (40 mg total) by mouth daily. 02/13/24 02/13/25  Johnny Garnette LABOR, MD  polyethylene glycol (MIRALAX  / GLYCOLAX ) 17 g packet Take 17 g by mouth 2 (two) times daily. 05/22/24   Sebastian Toribio GAILS, MD  senna-docusate (SENOKOT-S) 8.6-50 MG tablet Take 1 tablet by mouth 2 (two) times daily. 05/22/24   Sebastian Toribio GAILS, MD  sucralfate  (CARAFATE ) 1 GM/10ML suspension Take 10 mLs (1 g total) by mouth 2 (two) times daily. 06/26/24   Lue Elsie BROCKS, MD  triamcinolone  (NASACORT) 55 MCG/ACT nasal inhaler Place 1 spray into both nostrils daily as needed (for allergies).  10/17/12   Johnny Garnette LABOR, MD    ___________________________________________________________________________________________________ Physical Exam:    10/09/2024    6:35 PM 10/09/2024    5:00 PM 10/09/2024    2:09 PM  Vitals with BMI  Height   5' 8  Weight   130 lbs  BMI   19.77  Systolic 114 171   Diastolic 95 64   Pulse 59 67      1. General:  in No  Acute  distress   Chronically ill  -appearing 2. Psychological: Alert and   Oriented 3. Head/ENT:    Dry Mucous Membranes                          Head Non traumatic, neck supple                            Poor Dentition 4. SKIN:  decreased Skin turgor,  Skin clean Dry and intact no rash    5. Heart: Regular rate and rhythm no  Murmur, no Rub or gallop 6. Lungs:   no wheezes or crackles   7. Abdomen: Soft,  non-tender, Non distended   bowel sounds present 8. Lower extremities: no clubbing, cyanosis, 1+ edema 9. Neurologically Grossly intact, moving all 4 extremities equally   10. MSK: Normal range of motion    Chart has been reviewed  ______________________________________________________________________________________________  Assessment/Plan  83 y.o. female with medical history significant of  CAD, a.fib on eliquis , HTN, HLD, GERD, CKD3a, SSS sp pacemaker  Admitted for NSTEMI (non-ST elevated myocardial infarction)   Hypokalemia  FTT     Present on Admission:  Unstable angina (HCC)  Normocytic anemia  CAD (coronary artery disease/elevated troponin  PAF (paroxysmal atrial fibrillation) (HCC)  Essential hypertension  Chronic kidney disease, stage 3b (HCC)  Hyperlipidemia  Gastroesophageal reflux disease  Tachycardia-bradycardia syndrome (HCC)  Acute on chronic diastolic congestive heart failure (HCC)  Failure to thrive in adult  Hypokalemia  NSTEMI (non-ST elevated myocardial infarction) (HCC)  Night sweats  Moderate protein malnutrition     Normocytic anemia Currently stable  CAD (coronary artery disease/elevated troponin Appreciate cardiology involvement.  Currently on aspirin  metoprolol  25 mg twice daily and heparin  for possible NSTEMI  PAF (paroxysmal atrial fibrillation) (HCC) Hold Eliquis  continue metoprolol  25 mg twice daily Continue amiodarone  200  mg daily  Essential hypertension Continue metoprolol  25 twice daily  Chronic kidney disease, stage 3b (HCC)   -chronic avoid nephrotoxic medications such as NSAIDs, Vanco Zosyn combo,  avoid hypotension, continue to follow renal function Recheck renal function after dose of Lasix  given today  Hyperlipidemia Not on statin check lipid panel  Gastroesophageal reflux disease Continue Protonix  history of GI bleed  Tachycardia-bradycardia syndrome (HCC) Status post pacemaker  Acute on chronic diastolic congestive heart failure [facility]) Cardiology given a dose of Lasix  40 mg IV will reassess renal function and repeat as necessary obtain echogram in the morning monitor renal function monitor fluid status and weight  Failure to thrive in adult PT OT assessment nutritional consult  Hypokalemia - will replace electrolytes and repeat  check Mg, phos and Ca level and replace as needed Monitor on telemetry   Lab Results  Component Value Date   K 3.1 (L) 10/09/2024     Lab Results  Component Value Date   CREATININE 1.29 (H) 10/09/2024   Lab Results  Component Value Date   MG 2.0 06/20/2024   Lab Results  Component Value Date   CALCIUM  10.3 10/09/2024   PHOS 2.7 06/21/2024     NSTEMI (non-ST elevated myocardial infarction) [facility]) As per cardiology started on heparin  And added aspirin  81 mg daily continue with Toprol  25 mg p.o. twice daily Echogram in the morning n.p.o. postmidnight in case needs cardiac catheterization  Night sweats Pt reports yesterday episode of night sweat, she is unclear if she had any fever,  Will monitor for any source of infection  Moderate protein malnutrition Check prealbumin order nutrition consult     Other plan as per orders.  DVT prophylaxis:  heaprin    Code Status:    Code Status: Prior FULL CODE  as per patient   I had personally discussed CODE STATUS with patient   ACP   none   Family Communication:   Family not at  Bedside    Diet  Diet Orders (From admission, onward)     Start     Ordered   10/09/24 1753  Diet Heart Room service  appropriate? Yes; Fluid consistency: Thin  Diet effective now       Question Answer Comment  Room service appropriate? Yes   Fluid consistency: Thin      10/09/24 1752            Disposition Plan:     likely will need placement for rehabilitation                            Following barriers for discharge:                             Chest pain  work up is complete                            Electrolytes corrected                                                            Will need to be able to tolerate PO  Will need consultants to evaluate patient prior to discharge                                  Consult Orders  (From admission, onward)           Start     Ordered   10/09/24 1753  Consult to hospitalist  Paged to [facility] by [place] 18:03  Once       Provider:  (Not yet assigned)  Question Answer Comment  Place call to: [facility]   Reason for Consult Admit      10/09/24 1752                               Would benefit from PT/OT eval prior to DC  Ordered                                      Nutrition    consulted                  Wound care  consulted                                Consults called: Cardiology    Admission status:  ED Disposition     ED Disposition  Admit   Condition  --   Comment  Hospital Area: [facility] [[id number]]  Level of Care: Progressive [102]  Admit to Progressive based on following criteria: CARDIOVASCULAR & THORACIC of moderate stability with acute coronary syndrome symptoms/low risk myocardial infarction/hypertensive urgency/arrhythmias/heart failure potentially compromising stability and stable post cardiovascular intervention patients.  May place patient in observation at [facility] or [facility] if equivalent level of care is available:: No  Diagnosis: Unstable angina [facility]) [[id number]]  Admitting Physician: Luevenia Mcavoy [[id number]]  Attending  Physician: Courteny Egler [[id number]]           Obs      Level of care       progressive      Blease Quiver 10/09/2024, 8:29 PM    [facility]     after 2 AM please page floor coverage   If 7AM-7PM, please contact the day team taking care of the patient using [email or web address]

## 2024-10-09 NOTE — Assessment & Plan Note (Signed)
 PT OT assessment nutritional consult

## 2024-10-09 NOTE — Subjective & Objective (Signed)
 Presents with CP and SOB for the past 2 days. She has been using nitroglycerine with some help Known hx of CAD  Patient lives at home and has not had good PO for the past few days  [facility] also have had falls and Xray done of her back recently In ER trop elevated at 105 cardiology rec heparin  and possible cardiac cath in AM

## 2024-10-09 NOTE — Assessment & Plan Note (Signed)
 Currently stable

## 2024-10-09 NOTE — Assessment & Plan Note (Signed)
 Continue metoprolol  25 twice daily

## 2024-10-09 NOTE — Assessment & Plan Note (Signed)
 Status post pacemaker

## 2024-10-09 NOTE — Assessment & Plan Note (Signed)
 Cardiology given a dose of Lasix  40 mg IV will reassess renal function and repeat as necessary obtain echogram in the morning monitor renal function monitor fluid status and weight

## 2024-10-09 NOTE — ED Provider Notes (Signed)
 [place] EMERGENCY DEPARTMENT AT [facility] Provider Note   CSN: [id number] Arrival date & time: 10/09/24  1402     Patient presents with: Chest Pain and Shortness of Breath   Barbara [name] is a 83 y.o. female.   Pt is a 83 yo female with pmhx significant for afib (on Eliquis ), hld, htn, oa, CAD, GERD, CHF, and kidney stones.  Pt developed cp and sob since last night.  She's taken a total of 3 nitroglycerin  tabs.  It has helped the pain, but not completely.  She has not had a recent cath.  She also has not been doing well at home.  She's not been eating/drinking much.  She did fall out of her chair a few days ago and her back has been hurting since then.  She did see her pcp who ordered xrays which were done on 12/2.  Pt does not know results.       Prior to Admission medications   Medication Sig Start Date End Date Taking? Authorizing Provider  acetaminophen  (TYLENOL ) 325 MG tablet Take 2 tablets (650 mg total) by mouth every 6 (six) hours as needed for mild pain (pain score 1-3) or fever (or Fever >/= 101). 05/22/24   Sebastian Toribio GAILS, MD  alum & mag hydroxide-simeth (MAALOX/MYLANTA) 200-200-20 MG/5ML suspension Take 30 mLs by mouth every 4 (four) hours as needed for indigestion. 09/30/23   Leotis Bogus, MD  amiodarone  (PACERONE ) 200 MG tablet Take 1 tablet (200 mg total) by mouth daily. 08/02/24   Kate Lonni CROME, MD  apixaban  (ELIQUIS ) 2.5 MG TABS tablet Take 1 tablet (2.5 mg total) by mouth 2 (two) times daily. 01/21/24   Johnny Garnette LABOR, MD  cyanocobalamin  (VITAMIN B12) 500 MCG tablet Take 500 mcg by mouth daily.    [provider]  empagliflozin  (JARDIANCE ) 10 MG TABS tablet Take 1 tablet (10 mg total) by mouth daily. 01/21/24   Johnny Garnette LABOR, MD  ipratropium (ATROVENT ) 0.06 % nasal spray Place 2 sprays into both nostrils 2 (two) times daily as needed (allergies). 04/28/18   [provider]  isosorbide  mononitrate (IMDUR ) 30 MG 24 hr  tablet Take 1 tablet (30 mg total) by mouth daily. 02/24/24   Johnny Garnette LABOR, MD  ketoconazole  (NIZORAL ) 2 % cream Apply 1 Application topically 2 (two) times daily as needed for irritation. 12/26/23   Johnny Garnette LABOR, MD  loratadine  (CLARITIN ) 10 MG tablet Take 10 mg by mouth daily as needed for allergies.    [provider]  losartan  (COZAAR ) 50 MG tablet Take 1 tablet (50 mg total) by mouth daily. 10/04/24   Johnny Garnette LABOR, MD  megestrol  (MEGACE ) 40 MG tablet Take 1 tablet (40 mg total) by mouth in the morning. 04/30/24   Johnny Garnette LABOR, MD  metoprolol  tartrate (LOPRESSOR ) 25 MG tablet Take 1 tablet (25 mg total) by mouth 2 (two) times daily. 07/23/24   Johnny Garnette LABOR, MD  mirtazapine  (REMERON ) 15 MG tablet Take 1 tablet (15 mg total) by mouth at bedtime. 05/04/24   Johnny Garnette LABOR, MD  Multiple Vitamin (MULTIVITAMIN WITH MINERALS) TABS tablet Take 1 tablet by mouth daily. 06/27/24   Lue Elsie BROCKS, MD  nitroGLYCERIN  (NITROSTAT ) 0.4 MG SL tablet Place 1 tablet (0.4 mg total) under the tongue every 5 (five) minutes as needed for chest pain. 04/06/24   Johnny Garnette LABOR, MD  Nutritional Supplements (BOOST/FIBER PO) Take 1 Bottle by mouth 3 (three) times daily.  [provider]  pantoprazole  (PROTONIX ) 40 MG tablet Take 1 tablet (40 mg total) by mouth daily. 02/13/24 02/13/25  Johnny Garnette LABOR, MD  polyethylene glycol (MIRALAX  / GLYCOLAX ) 17 g packet Take 17 g by mouth 2 (two) times daily. 05/22/24   Sebastian Toribio GAILS, MD  senna-docusate (SENOKOT-S) 8.6-50 MG tablet Take 1 tablet by mouth 2 (two) times daily. 05/22/24   Sebastian Toribio GAILS, MD  sucralfate  (CARAFATE ) 1 GM/10ML suspension Take 10 mLs (1 g total) by mouth 2 (two) times daily. 06/26/24   Lue Elsie BROCKS, MD  triamcinolone  (NASACORT) 55 MCG/ACT nasal inhaler Place 1 spray into both nostrils daily as needed (for allergies).  10/17/12   Johnny Garnette LABOR, MD    Allergies: Lidocaine , Morphine , Procaine hcl, Sulfonamide derivatives,  Amlodipine , Ezetimibe-simvastatin, Latex, Norco [hydrocodone -acetaminophen ], Tape, and Tramadol     Review of Systems  Respiratory:  Positive for shortness of breath.   Cardiovascular:  Positive for chest pain.  Musculoskeletal:  Positive for back pain.  All other systems reviewed and are negative.   Updated Vital Signs BP (!) 114/95 (BP Location: Right Arm)   Pulse (!) 59   Temp 97.6 F (36.4 C) (Oral)   Resp (!) 22   Ht 5' 8 (1.727 m)   Wt 59 kg   SpO2 94%   BMI 19.77 kg/m   Physical Exam Vitals and nursing note reviewed.  Constitutional:      Appearance: She is well-developed.  HENT:     Head: Normocephalic and atraumatic.  Eyes:     Extraocular Movements: Extraocular movements intact.     Pupils: Pupils are equal, round, and reactive to light.  Cardiovascular:     Rate and Rhythm: Normal rate and regular rhythm.     Heart sounds: Normal heart sounds.  Pulmonary:     Effort: Pulmonary effort is normal.     Breath sounds: Normal breath sounds.  Abdominal:     General: Bowel sounds are normal.     Palpations: Abdomen is soft.  Musculoskeletal:        General: Normal range of motion.     Cervical back: Normal range of motion and neck supple.  Skin:    General: Skin is warm.     Capillary Refill: Capillary refill takes less than 2 seconds.  Neurological:     General: No focal deficit present.     Mental Status: She is alert and oriented to person, place, and time.  Psychiatric:        Mood and Affect: Mood normal.        Behavior: Behavior normal.     (all labs ordered are listed, but only abnormal results are displayed) Labs Reviewed  BASIC METABOLIC PANEL WITH GFR - Abnormal; Notable for the following components:      Result Value   Potassium 3.1 (*)    CO2 21 (*)    Creatinine, Ser 1.29 (*)    GFR, Estimated 41 (*)    All other components within normal limits  CBC - Abnormal; Notable for the following components:   RDW 17.9 (*)    All other  components within normal limits  URINALYSIS, ROUTINE W REFLEX MICROSCOPIC - Abnormal; Notable for the following components:   APPearance HAZY (*)    Glucose, UA >=500 (*)    Protein, ur 100 (*)    Leukocytes,Ua TRACE (*)    Bacteria, UA RARE (*)    All other components within normal limits  TROPONIN I (HIGH SENSITIVITY) -  Abnormal; Notable for the following components:   Troponin I (High Sensitivity) 105 (*)    All other components within normal limits  TROPONIN I (HIGH SENSITIVITY) - Abnormal; Notable for the following components:   Troponin I (High Sensitivity) 71 (*)    All other components within normal limits  RESP PANEL BY RT-PCR (RSV, FLU A&B, COVID)  RVPGX2  MAGNESIUM   APTT  HEPARIN  LEVEL (UNFRACTIONATED)  APTT  HEPARIN  LEVEL (UNFRACTIONATED)  CK  TROPONIN I (HIGH SENSITIVITY)  TROPONIN I (HIGH SENSITIVITY)    EKG: EKG Interpretation Date/Time:  Saturday October 09 2024 14:11:45 EST Ventricular Rate:  61 PR Interval:    QRS Duration:  90 QT Interval:  432 QTC Calculation: 434 R Axis:   50  Text Interpretation: No significant change since last tracing Confirmed by Dean Clarity [phone]) on 10/09/2024 5:54:22 PM  Radiology: [facility] Chest 2 View Result Date: 10/09/2024 CLINICAL DATA:  Low back pain after fall.  Chest pain. EXAM: CHEST - 2 VIEW, LUMBAR SPINE COMPARISON:  06/20/2024, 07/03/2024. FINDINGS: Chest: The heart is enlarged and the mediastinal contour is within normal limits. A dual lead pacemaker is present over the left chest. There is eventration of the posterior left diaphragm with a associated atelectasis. No consolidation, effusion, or pneumothorax is seen. Degenerative changes are present in the thoracic spine. Shoulder arthroplasty changes are noted on the right. Lumbar spine: There is no evidence of acute fracture in the lumbar spine. Alignment appears normal. Multilevel intervertebral disc space narrowing, degenerative endplate changes, and facet arthropathy  are noted. There is atherosclerotic calcification of the aorta. 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 IMPRESSION: 1. No active cardiopulmonary disease. 2. No acute fracture or subluxation in the lumbar spine. Electronically Signed   By: Leita Birmingham M.D.   On: 10/09/2024 15:18     Procedures   Medications Ordered in the ED  furosemide  (LASIX ) injection 40 mg (has no administration in time range)  amiodarone  (PACERONE ) tablet 200 mg (has no administration in time range)  empagliflozin  (JARDIANCE ) tablet 10 mg (has no administration in time range)  metoprolol  tartrate (LOPRESSOR ) tablet 25 mg (has no administration in time range)  aspirin  EC tablet 81 mg (has no administration in time range)  sodium chloride  0.9 % bolus 1,000 mL (has no administration in time range)  heparin  ADULT infusion 100 units/mL (25000 units/250mL) (has no administration in time range)  potassium chloride  (KLOR-CON ) packet 60 mEq (60 mEq Oral Given 10/09/24 1842)                                    Medical Decision Making Amount and/or Complexity of Data Reviewed Labs: ordered. Radiology: ordered.  Risk Prescription drug management. Decision regarding hospitalization.   This patient presents to the ED for concern of cp, sob, this involves an extensive number of treatment options, and is a complaint that carries with it a high risk of complications and morbidity.  The differential diagnosis includes cardiac, pulm, gi, msk   Co morbidities that complicate the patient evaluation  afib (on Eliquis ), hld, htn, oa, CAD, GERD, CHF, and kidney stones.   Additional history obtained:  Additional history obtained from epic chart review External records from outside source obtained and reviewed including EMS report   Lab Tests:  I Ordered, and personally interpreted labs.  The pertinent results include:  cbc nl; bmp nl other than cr elevated at 1.29  (  stable) and k low at 3.1; trop elevated at  105; covid/flu/rsv neg   Imaging Studies ordered:  I ordered imaging studies including cxr  I independently visualized and interpreted imaging which showed  No active cardiopulmonary disease.  2. No acute fracture or subluxation in the lumbar spine.   I agree with the radiologist interpretation  I also reviewed the lumbar xray which showed no acute fx  Cardiac Monitoring:  The patient was maintained on a cardiac monitor.  I personally viewed and interpreted the cardiac monitored which showed an underlying rhythm of: afib/paced   Medicines ordered and prescription drug management:  I ordered medication including kdur/ivfs  for sx  Reevaluation of the patient after these medicines showed that the patient improved I have reviewed the patients home medicines and have made adjustments as needed   Test Considered:  ct   Consultations Obtained:  I requested consultation with the cardiologist (Dr. Santo),  and discussed lab and imaging findings as well as pertinent plan - he recommends admission.  Heparin  ordered by cards.  Possible cath during stay. Pt d/w Dr. Silvester ([facility]) who will admit.   Problem List / ED Course:  NSTEMI:  heparin  ordered.  Troponin trending Hypokalemia:  likely due to poor po intake.  Po kdur ordered.   Reevaluation:  After the interventions noted above, I reevaluated the patient and found that they have :improved   Social Determinants of Health:  Lives alone.  No family close by.  Son in [place].   Dispostion:  After consideration of the diagnostic results and the patients response to treatment, I feel that the patent would benefit from admission.       Final diagnoses:  NSTEMI (non-ST elevated myocardial infarction) (HCC)  Hypokalemia  FTT (failure to thrive) in adult  Back strain, initial encounter    ED Discharge Orders     None          Dean Clarity, MD 10/09/24 [phone]

## 2024-10-10 ENCOUNTER — Observation Stay (HOSPITAL_COMMUNITY)

## 2024-10-10 DIAGNOSIS — Y831 Surgical operation with implant of artificial internal device as the cause of abnormal reaction of the patient, or of later complication, without mention of misadventure at the time of the procedure: Secondary | ICD-10-CM | POA: Diagnosis present

## 2024-10-10 DIAGNOSIS — Z96611 Presence of right artificial shoulder joint: Secondary | ICD-10-CM | POA: Diagnosis not present

## 2024-10-10 DIAGNOSIS — Z1152 Encounter for screening for COVID-19: Secondary | ICD-10-CM | POA: Diagnosis not present

## 2024-10-10 DIAGNOSIS — R627 Adult failure to thrive: Secondary | ICD-10-CM | POA: Diagnosis present

## 2024-10-10 DIAGNOSIS — L89156 Pressure-induced deep tissue damage of sacral region: Secondary | ICD-10-CM | POA: Diagnosis present

## 2024-10-10 DIAGNOSIS — R0989 Other specified symptoms and signs involving the circulatory and respiratory systems: Secondary | ICD-10-CM | POA: Diagnosis not present

## 2024-10-10 DIAGNOSIS — I214 Non-ST elevation (NSTEMI) myocardial infarction: Secondary | ICD-10-CM | POA: Diagnosis present

## 2024-10-10 DIAGNOSIS — G4733 Obstructive sleep apnea (adult) (pediatric): Secondary | ICD-10-CM | POA: Diagnosis not present

## 2024-10-10 DIAGNOSIS — E876 Hypokalemia: Secondary | ICD-10-CM | POA: Diagnosis present

## 2024-10-10 DIAGNOSIS — I480 Paroxysmal atrial fibrillation: Secondary | ICD-10-CM | POA: Diagnosis present

## 2024-10-10 DIAGNOSIS — D649 Anemia, unspecified: Secondary | ICD-10-CM | POA: Diagnosis not present

## 2024-10-10 DIAGNOSIS — I340 Nonrheumatic mitral (valve) insufficiency: Secondary | ICD-10-CM | POA: Diagnosis present

## 2024-10-10 DIAGNOSIS — Z7984 Long term (current) use of oral hypoglycemic drugs: Secondary | ICD-10-CM | POA: Diagnosis not present

## 2024-10-10 DIAGNOSIS — E440 Moderate protein-calorie malnutrition: Secondary | ICD-10-CM | POA: Diagnosis present

## 2024-10-10 DIAGNOSIS — I422 Other hypertrophic cardiomyopathy: Secondary | ICD-10-CM | POA: Diagnosis present

## 2024-10-10 DIAGNOSIS — E785 Hyperlipidemia, unspecified: Secondary | ICD-10-CM | POA: Diagnosis present

## 2024-10-10 DIAGNOSIS — Z950 Presence of cardiac pacemaker: Secondary | ICD-10-CM | POA: Diagnosis not present

## 2024-10-10 DIAGNOSIS — I130 Hypertensive heart and chronic kidney disease with heart failure and stage 1 through stage 4 chronic kidney disease, or unspecified chronic kidney disease: Secondary | ICD-10-CM | POA: Diagnosis present

## 2024-10-10 DIAGNOSIS — I495 Sick sinus syndrome: Secondary | ICD-10-CM | POA: Diagnosis present

## 2024-10-10 DIAGNOSIS — R0602 Shortness of breath: Secondary | ICD-10-CM | POA: Diagnosis present

## 2024-10-10 DIAGNOSIS — N1832 Chronic kidney disease, stage 3b: Secondary | ICD-10-CM | POA: Diagnosis present

## 2024-10-10 DIAGNOSIS — I10 Essential (primary) hypertension: Secondary | ICD-10-CM | POA: Diagnosis not present

## 2024-10-10 DIAGNOSIS — I5031 Acute diastolic (congestive) heart failure: Secondary | ICD-10-CM | POA: Diagnosis not present

## 2024-10-10 DIAGNOSIS — I200 Unstable angina: Secondary | ICD-10-CM | POA: Diagnosis not present

## 2024-10-10 DIAGNOSIS — Z7901 Long term (current) use of anticoagulants: Secondary | ICD-10-CM | POA: Diagnosis not present

## 2024-10-10 DIAGNOSIS — I25118 Atherosclerotic heart disease of native coronary artery with other forms of angina pectoris: Secondary | ICD-10-CM | POA: Diagnosis not present

## 2024-10-10 DIAGNOSIS — K219 Gastro-esophageal reflux disease without esophagitis: Secondary | ICD-10-CM | POA: Diagnosis present

## 2024-10-10 DIAGNOSIS — E872 Acidosis, unspecified: Secondary | ICD-10-CM | POA: Diagnosis present

## 2024-10-10 DIAGNOSIS — Z681 Body mass index (BMI) 19 or less, adult: Secondary | ICD-10-CM | POA: Diagnosis not present

## 2024-10-10 DIAGNOSIS — R918 Other nonspecific abnormal finding of lung field: Secondary | ICD-10-CM | POA: Diagnosis not present

## 2024-10-10 DIAGNOSIS — I5033 Acute on chronic diastolic (congestive) heart failure: Secondary | ICD-10-CM | POA: Diagnosis present

## 2024-10-10 DIAGNOSIS — I2510 Atherosclerotic heart disease of native coronary artery without angina pectoris: Secondary | ICD-10-CM | POA: Diagnosis present

## 2024-10-10 DIAGNOSIS — D631 Anemia in chronic kidney disease: Secondary | ICD-10-CM | POA: Diagnosis present

## 2024-10-10 DIAGNOSIS — Z7982 Long term (current) use of aspirin: Secondary | ICD-10-CM | POA: Diagnosis not present

## 2024-10-10 DIAGNOSIS — T82855A Stenosis of coronary artery stent, initial encounter: Secondary | ICD-10-CM | POA: Diagnosis present

## 2024-10-10 DIAGNOSIS — E7800 Pure hypercholesterolemia, unspecified: Secondary | ICD-10-CM | POA: Diagnosis not present

## 2024-10-10 DIAGNOSIS — I25110 Atherosclerotic heart disease of native coronary artery with unstable angina pectoris: Secondary | ICD-10-CM | POA: Diagnosis not present

## 2024-10-10 DIAGNOSIS — W07XXXA Fall from chair, initial encounter: Secondary | ICD-10-CM | POA: Diagnosis present

## 2024-10-10 LAB — COMPREHENSIVE METABOLIC PANEL WITH GFR
ALT: 11 U/L (ref 0–44)
AST: 44 U/L — ABNORMAL HIGH (ref 15–41)
Albumin: 2.6 g/dL — ABNORMAL LOW (ref 3.5–5.0)
Alkaline Phosphatase: 109 U/L (ref 38–126)
Anion gap: 18 — ABNORMAL HIGH (ref 5–15)
BUN: 18 mg/dL (ref 8–23)
CO2: 16 mmol/L — ABNORMAL LOW (ref 22–32)
Calcium: 10.2 mg/dL (ref 8.9–10.3)
Chloride: 111 mmol/L (ref 98–111)
Creatinine, Ser: 1.38 mg/dL — ABNORMAL HIGH (ref 0.44–1.00)
GFR, Estimated: 38 mL/min — ABNORMAL LOW (ref >60)
Glucose, Bld: 112 mg/dL — ABNORMAL HIGH (ref 70–99)
Potassium: 4.3 mmol/L (ref 3.5–5.1)
Sodium: 145 mmol/L (ref 135–145)
Total Bilirubin: UNDETERMINED mg/dL (ref 0.0–1.2)
Total Protein: 6.5 g/dL (ref 6.5–8.1)

## 2024-10-10 LAB — BASIC METABOLIC PANEL WITH GFR
Anion gap: 8 (ref 5–15)
BUN: 22 mg/dL (ref 8–23)
CO2: 24 mmol/L (ref 22–32)
Calcium: 9.5 mg/dL (ref 8.9–10.3)
Chloride: 105 mmol/L (ref 98–111)
Creatinine, Ser: 1.31 mg/dL — ABNORMAL HIGH (ref 0.44–1.00)
GFR, Estimated: 40 mL/min — ABNORMAL LOW (ref >60)
Glucose, Bld: 96 mg/dL (ref 70–99)
Potassium: 4.1 mmol/L (ref 3.5–5.1)
Sodium: 137 mmol/L (ref 135–145)

## 2024-10-10 LAB — CBC
HCT: 44.9 % (ref 36.0–46.0)
Hemoglobin: 14.2 g/dL (ref 12.0–15.0)
MCH: 28.1 pg (ref 26.0–34.0)
MCHC: 31.6 g/dL (ref 30.0–36.0)
MCV: 88.7 fL (ref 80.0–100.0)
Platelets: 216 K/uL (ref 150–400)
RBC: 5.06 MIL/uL (ref 3.87–5.11)
RDW: 18.7 % — ABNORMAL HIGH (ref 11.5–15.5)
WBC: 4.1 K/uL (ref 4.0–10.5)
nRBC: 0 % (ref 0.0–0.2)

## 2024-10-10 LAB — MAGNESIUM
Magnesium: 2.3 mg/dL (ref 1.7–2.4)
Magnesium: UNDETERMINED mg/dL (ref 1.7–2.4)
Magnesium: 1.8 mg/dL (ref 1.7–2.4)

## 2024-10-10 LAB — ECHOCARDIOGRAM COMPLETE
Area-P 1/2: 2.46 cm2
Height: 68 in
S' Lateral: 2.7 cm
Weight: 2119.94 [oz_av]

## 2024-10-10 LAB — TROPONIN I (HIGH SENSITIVITY): Troponin I (High Sensitivity): 78 ng/L — ABNORMAL HIGH (ref <18)

## 2024-10-10 LAB — APTT: aPTT: 73 s — ABNORMAL HIGH (ref 24–36)

## 2024-10-10 LAB — PREALBUMIN: Prealbumin: 15 mg/dL — ABNORMAL LOW (ref 18–38)

## 2024-10-10 LAB — HEPARIN LEVEL (UNFRACTIONATED): Heparin Unfractionated: 1.07 [IU]/mL — ABNORMAL HIGH (ref 0.30–0.70)

## 2024-10-10 LAB — PHOSPHORUS: Phosphorus: 3.5 mg/dL (ref 2.5–4.6)

## 2024-10-10 MED ORDER — PERFLUTREN LIPID MICROSPHERE
1.0000 mL | INTRAVENOUS | Status: AC | PRN
  Administered 2024-10-10: 2 mL via INTRAVENOUS

## 2024-10-10 MED ORDER — ADULT MULTIVITAMIN W/MINERALS CH
1.0000 | ORAL_TABLET | Freq: Every day | ORAL | Status: DC
  Administered 2024-10-10 – 2024-10-27 (×18): 1 via ORAL
  Filled 2024-10-10 (×18): qty 1

## 2024-10-10 MED ORDER — FUROSEMIDE 10 MG/ML IJ SOLN
40.0000 mg | Freq: Two times a day (BID) | INTRAMUSCULAR | Status: DC
  Administered 2024-10-10 (×2): 40 mg via INTRAVENOUS
  Filled 2024-10-10 (×2): qty 4

## 2024-10-10 MED ORDER — ENSURE PLUS HIGH PROTEIN PO LIQD
237.0000 mL | Freq: Two times a day (BID) | ORAL | Status: DC
  Administered 2024-10-10 – 2024-11-04 (×26): 237 mL via ORAL

## 2024-10-10 MED ORDER — PROSOURCE PLUS PO LIQD
30.0000 mL | Freq: Two times a day (BID) | ORAL | Status: DC
  Administered 2024-10-10 – 2024-11-06 (×36): 30 mL via ORAL
  Filled 2024-10-10 (×33): qty 30

## 2024-10-10 MED ORDER — VITAMIN C 500 MG PO TABS
500.0000 mg | ORAL_TABLET | Freq: Two times a day (BID) | ORAL | Status: DC
  Administered 2024-10-10 – 2024-10-18 (×16): 500 mg via ORAL
  Filled 2024-10-10 (×16): qty 1

## 2024-10-10 MED ORDER — ASPIRIN 81 MG PO CHEW
81.0000 mg | CHEWABLE_TABLET | ORAL | Status: AC
  Administered 2024-10-11: 81 mg via ORAL
  Filled 2024-10-10: qty 1

## 2024-10-10 MED ORDER — FREE WATER: 500.0000 mL | Freq: Once | Status: DC

## 2024-10-10 MED ORDER — ZINC SULFATE 220 (50 ZN) MG PO CAPS
220.0000 mg | ORAL_CAPSULE | Freq: Every day | ORAL | Status: AC
  Administered 2024-10-10 – 2024-10-23 (×14): 220 mg via ORAL
  Filled 2024-10-10 (×14): qty 1

## 2024-10-10 NOTE — Progress Notes (Signed)
  Echocardiogram 2D Echocardiogram has been performed.  Tinnie FORBES Gosling RDCS 10/10/2024, 3:38 PM

## 2024-10-10 NOTE — Evaluation (Signed)
 Physical Therapy Evaluation Patient Details Name: Barbara [name] MRN: [id number] DOB: 01-Oct-1941 Today's Date: 10/10/2024  History of Present Illness  83 yo female adm 12/6 with SOB and chest pain. Admitted for NSTEMI and acute CHF exacerbation. PMH includes: CAD, HFpEF, A-fib s/p PPM, essential HTN, HLD, anxiety, CKD, anemia, Rt TSA, vertigo, Lt femur fx  Clinical Impression  Pt in bed upon arrival of PT, agreeable to evaluation at this time. Prior to admission the pt was able to complete transfers and short distance ambulation in her home with use of RW without assistance. The pt did have an aide coming daily from 8-10am and 6-8pm, but reports she is home alone at all other times. The pt presents today needing minA to complete all bed mobility, and modA to complete all sit-stands due to limited power in LE and poor anterior wt shift. Pt needing assist once standing to prevent posterior LOB, and was limited to ~12 ft ambulation with minA. Given need for assist for all transfers and mobility, recommend continued skilled PT acutely and post-acute rehab <3hours/day after medically stable for d/c.        If plan is discharge home, recommend the following: A little help with walking and/or transfers;A little help with bathing/dressing/bathroom;Assistance with cooking/housework;Assist for transportation;Help with stairs or ramp for entrance   Can travel by private vehicle   Yes    Equipment Recommendations None recommended by PT  Recommendations for Other Services  OT consult    Functional Status Assessment Patient has had a recent decline in their functional status and demonstrates the ability to make significant improvements in function in a reasonable and predictable amount of time.     Precautions / Restrictions Precautions Precautions: Fall Recall of Precautions/Restrictions: Intact Restrictions Weight Bearing Restrictions Per Provider Order: No      Mobility  Bed  Mobility Overal bed mobility: Needs Assistance Bed Mobility: Supine to Sit, Sit to Supine     Supine to sit: Min assist, Used rails, HOB elevated Sit to supine: Min assist   General bed mobility comments: minA to complete scooting of hips, trunk elevation. minA to return LE to bed    Transfers Overall transfer level: Needs assistance Equipment used: Rolling walker (2 wheels) Transfers: Sit to/from Stand Sit to Stand: Mod assist, From elevated surface           General transfer comment: max cues for hand placement, modA to rise even from elevated bed, completed x4 in session    Ambulation/Gait Ambulation/Gait assistance: Min assist Gait Distance (Feet): 12 Feet Assistive device: Rolling walker (2 wheels) Gait Pattern/deviations: Step-through pattern, Decreased stride length, Trunk flexed Gait velocity: decreased Gait velocity interpretation: <1.31 ft/sec, indicative of household ambulator   General Gait Details: pt maintains trunk and head flexed, small steps with minimal clearance. no overt buckling and SpO2 >92% despite pt reporting SOB      Balance Overall balance assessment: Needs assistance Sitting-balance support: No upper extremity supported, Feet supported Sitting balance-Leahy Scale: Good     Standing balance support: Bilateral upper extremity supported, During functional activity Standing balance-Leahy Scale: Poor Standing balance comment: dependent on BUE support                             Pertinent Vitals/Pain Pain Assessment Pain Assessment: No/denies pain    Home Living Family/patient expects to be discharged to:: Private residence Living Arrangements: Alone Available Help at Discharge: Family;Available PRN/intermittently;Personal care attendant Type  of Home: House Home Access: Stairs to enter Entrance Stairs-Rails: Right Entrance Stairs-Number of Steps: 2   Home Layout: One level Home Equipment: Rollator (4 wheels);Rolling Walker  (2 wheels);Cane - quad;Shower seat - built in;BSC/3in1;Hand held shower head;Grab bars - tub/shower (upright walker) Additional Comments: Has a PCA who comes rom 8-10am and 6-8pm, assists with ADLs and mobility    Prior Function Prior Level of Function : Needs assist             Mobility Comments: RW in the home, has become less and less and mobile over the last two weeks since HHPT d/c. 2 falls in last 6 months ADLs Comments: Neeed assist for all ADLs, PCA provides when present, pt reports going to [facility] during day when aide is out     Extremity/Trunk Assessment   Upper Extremity Assessment Upper Extremity Assessment: Defer to OT evaluation;Generalized weakness (pt with limited grip strength, especially in RUE but good strength in bicep/tricep. reports unable to push buttons on remot)    Lower Extremity Assessment Lower Extremity Assessment: Generalized weakness (grossly 4/5 to MMT)    Cervical / Trunk Assessment Cervical / Trunk Assessment: Normal;Kyphotic  Communication   Communication Communication: No apparent difficulties    Cognition Arousal: Alert Behavior During Therapy: WFL for tasks assessed/performed   PT - Cognitive impairments: No family/caregiver present to determine baseline, Initiation, Sequencing, Problem solving, Safety/Judgement                       PT - Cognition Comments: pt needing cues for mobility and transfer technique, not initiaiting changes even though not working until cued Following commands: Impaired Following commands impaired: Follows one step commands with increased time     Cueing Cueing Techniques: Verbal cues     General Comments General comments (skin integrity, edema, etc.): VSS on RA, SpO2 low of 93% with gait    Exercises     Assessment/Plan    PT Assessment Patient needs continued PT services  PT Problem List Decreased strength;Decreased balance;Decreased activity tolerance;Decreased mobility;Decreased safety  awareness       PT Treatment Interventions DME instruction;Gait training;Stair training;Functional mobility training;Therapeutic activities;Therapeutic exercise;Balance training;Patient/family education    PT Goals (Current goals can be found in the Care Plan section)  Acute Rehab PT Goals Patient Stated Goal: to improve strength and go home PT Goal Formulation: With patient Time For Goal Achievement: 10/24/24 Potential to Achieve Goals: Good    Frequency Min 2X/week        AM-PAC PT 6 Clicks Mobility  Outcome Measure Help needed turning from your back to your side while in a flat bed without using bedrails?: A Little Help needed moving from lying on your back to sitting on the side of a flat bed without using bedrails?: A Little Help needed moving to and from a bed to a chair (including a wheelchair)?: A Lot Help needed standing up from a chair using your arms (e.g., wheelchair or bedside chair)?: A Lot Help needed to walk in hospital room?: A Lot Help needed climbing 3-5 steps with a railing? : Total 6 Click Score: 13    End of Session Equipment Utilized During Treatment: Gait belt Activity Tolerance: Patient tolerated treatment well Patient left: in bed;with call bell/phone within reach;with bed alarm set Nurse Communication: Mobility status;Other (comment) (pill found in bed) PT Visit Diagnosis: Unsteadiness on feet (R26.81);Other abnormalities of gait and mobility (R26.89);Repeated falls (R29.6);Muscle weakness (generalized) (M62.81)    Time: 8463-8384 PT  Time Calculation (min) (ACUTE ONLY): 39 min   Charges:   PT Evaluation $PT Eval Moderate Complexity: 1 Mod PT Treatments $Therapeutic Exercise: 8-22 mins $Therapeutic Activity: 8-22 mins PT General Charges $$ ACUTE PT VISIT: 1 Visit         Izetta Call, PT, DPT   Acute Rehabilitation Department Office [phone] Secure Chat Communication Preferred  Izetta JULIANNA Call 10/10/2024, 6:30 PM

## 2024-10-10 NOTE — Discharge Instructions (Signed)
 Heart Failure Nutrition Therapy For The Undernourished  This nutrition therapy will help you eat more calories and protein in addition to helping your heart. These suggestions can help you if you can't eat enough, have lost weight, or need extra calories and protein in your diet. This plan focuses on: Eating more calories.  Eating more calories can give you more energy, help you gain weight, and promote wound healing. Eating more protein. Protein can help you heal, give you energy, and build and repair muscle. Ask your registered dietitian nutritionist (RDN) how much protein is the right amount for you. Eating a low-sodium diet while increasing your calorie and protein intake. This will help control buildup of fluids around your heart, stomach, lungs, and legs. Too much sodium may make your blood pressure too high and put stress on your heart. You can achieve these goals by: Eating more calories and protein. Eating less than 2,000 milligrams of sodium per day. Reading food labels to keep track of how much sodium, protein, and calories are in the foods you eat. Limiting fluid intake if your doctor has asked you to follow a fluid restriction. Reading the Food Label: How Much Sodium Is too Much? The nutrition plan for heart failure usually limits the sodium that you get from food and beverages to 2,000 milligrams per day. Salt is the main source of sodium. Read the nutrition label to find out how much sodium is in 1 serving. Foods with more than 300 milligrams of sodium per serving may not fit into a reduced-sodium meal plan. Check serving sizes on the label. If you eat more than 1 serving, you will get more sodium than the amount listed. You can find protein and calories on the Nutrition Facts label too. Eating More Protein and Calories Eat at least 6 small meals throughout the day. If you become full quickly after you begin eating, you may benefit from small, frequent meals instead of 3 large  meals. Keep high-calorie and high-protein snacks available in your car or bag for when you get hungry. Add extra calories and protein when cooking meals. Cook with unsalted butter, margarine, or oils instead of calorie-free cooking spray. Use reduced-fat (2%) milk instead of fat-free (skim) milk. Ask your RDN if whole milk is recommended for you. Add milk powder to protein shakes, cereal, or casseroles. Sprinkle unsalted nuts and seeds into your cereal, stir-fry, or salad. Add 1 tablespoon mayonnaise, sugar, or honey to foods (adds 50 to 100 calories). Add calories and protein to your fruits and vegetables. Fruits are low in calories. Add peanut butter, yogurt, or cottage cheese to add calories and protein. Buy fruit cups canned in syrup instead of water  or juice. Vegetables are also low in calories. Add butter, sour cream, margarine, or oil for extra calories. Potatoes, corn, and peas are higher in calories than nonstarchy vegetables. Avocados are rich in calories and healthy fat. Eat them alone or use them as a topping or spread. Limit foods that have little to no nutrition. Eat fewer calorie-free and low-calorie foods such as applesauce, Jell-O, and diet soft drinks. These foods will take up space in your stomach and may leave little room for higher-calorie foods and beverages.  When shopping, avoid products that say "low calorie" or "low fat." Drink high-calorie beverages. There are several liquid nutrition supplements available that also have less than 300 milligrams of sodium per serving. Drink them between meals as snacks.  Make your own supplement at home with milk or ice  cream.  Ask your RDN if protein powder would also be a good addition. Milk and juice are high in calories. Limit plain tea, coffee, and water . These have no calories.  Foods Recommended Food Group Foods Recommended  Grains Whole wheat bread with less than 80 milligrams of sodium per slice  Many cold cereals,  especially shredded wheat and granola Oats or cream of wheat made with milk (add butter or margarine, sugar, dried fruit, and nuts for more calories and fat) Quinoa Wheat germ (sprinkle on soups, baked goods, or cereal)  Vegetables Fresh and frozen vegetables without added sauces, salt, or sodium Homemade soups (salt free or low sodium) with dry milk powder or cream Potatoes, corn, and peas  Fruits Canned fruits (canned in heavy syrup) Dried fruits, such as raisins, cranberries, and prunes Avocados  Dairy (Milk and Milk Products) Milk or milk powder Soy milk Yogurt, including Greek yogurt Small amounts of natural, blocked cheeses or reduced-sodium cheese (Swiss, ricotta, and fresh mozzarella are lower in sodium than others) Regular or soft cream cheese and low-sodium cottage cheese  Protein Foods (Meat, Poultry, Fish, and Armed Forces Logistics/support/administrative Officer) [employer] and fish Turkey bacon (check the Nutrition Facts label to make sure its not packaged in a sodium solution) Tuna canned or packed in oil Dried beans, peas, and legumes; edamame (fresh soybeans) Eggs Unsalted nuts or peanut butter  Desserts and Snacks Apple (with peanut butter); baked apple with sugar and butter             Granola bars Pudding made with reduced-fat (2%) milk or milk powder Smoothies  Custard Chocolate syrup added to milk or ice cream  Fats Tub or liquid margarine (trans fat-free) Butter (check with your doctor or RDN first) Unsaturated fat oils (canola, olive, corn, sunflower, safflower, peanut, vegetable, and soybean) Flaxseed (oil or ground)  Sodium-Free Condiments Fresh or dried herbs; pepper; vinegar; lemon juice or lime juice; salt-free seasoning mixes and marinades such as Mrs. Dash or McCormick's salt-free blend; simple salad dressings such as vinegar and oil; low-sodium ketchup. You can purchase salt-free barbecue sauce and many others on the Internet.  Ask your RDN.     Foods Not Recommended Food Group Foods Not  Recommended  Grains Breads or crackers topped with salt Cereals (hot/cold) with more than 300 milligrams sodium per serving Biscuits, cornbread, and other "quick" breads prepared with baking soda Prepackaged bread crumbs Self-rising flours  Vegetables Canned vegetables (unless they are salt free or low sodium) Frozen vegetables with high-sodium seasoning and sauces Sauerkraut and pickled vegetables Canned or dried soups (unless they are salt free or low  sodium) French fries and onion rings Broth-based soups  Fruits Dried fruits preserved with sodium-containing additives  Dairy (Milk and Milk Products) Buttermilk Processed cheeses such as Cheese Wiz, Velveeta, and Queso Feta cheese Shredded cheese (has more sodium than blocked cheeses) "Singles" cheese slices and string cheese  Protein Foods (Meat, Poultry, Fish, and Beans) Cured meats (bacon, ham, sausage, pepperoni, and hot dogs)  Canned meats (chili, Vienna sausage, sardines, and Spam) Smoked fish and meats Frozen meals with more than 600 milligrams of sodium Egg Beaters   Fats Salted butter or margarine  Condiments Salt, sea salt, kosher salt, onion salt, and garlic salt Seasoning mixes containing salt such as Lemon Pepper or Lawry's Bouillon cubes Catsup or ketchup Barbecue sauce and Worcestershire sauce Soy sauce Salsa, pickles, olives, relish Salad dressings: ranch, blue cheese, Italian, and French  Alcohol Check with your doctor.   Heart Failure (  Undernourished) Sample 1-Day Menu View Nutrient Info Breakfast 1 cup oatmeal made with milk 1 tablespoon wheat germ (sprinkle on oatmeal) 1 cup (240 milliliters) reduced-fat (2%) milk 2 tablespoons chocolate syrup (add to milk) 1 banana 2 tablespoons peanut butter (for banana)  Morning Snack 1/4 cup raw almonds Cranberries (add to almonds) 1 cup (240 milliliters) oral nutritional supplement  Lunch 3 ounces grilled chicken breast 1 small potato 2 ounces cheese (to add to  potato) 2 tablespoons margarine (to add to potato) Croutons (add to salad) Walnuts (add to salad) Vinegar and oil dressing (add to salad) 1 cup (240 milliliters) juice  Afternoon Snack 10 tortilla chips Guacamole (avocado, lime juice, onion, and tomatoes, and pepper) 1 cup (240 milliliters) water  or juice  Evening Meal 3 ounces herb-baked fish 1 cup homemade mashed potatoes with margarine (trans fat-free) and milk 1/2 cup creamed spinach 1 cup (240 milliliters) water  or juice  Evening Snack 1 cup (240 milliliters) homemade milkshake 1 slice low-sodium turkey breast 1 slice whole wheat bread 1 slice cheese Mayonnaise  Daily Sum Nutrient Unit Value  Macronutrients  Energy kcal 3461  Energy kJ 14480  Protein g 151  Total lipid (fat) g 158  Carbohydrate, by difference g 381  Fiber, total dietary g 36  Sugars, total g 163  Minerals  Calcium , Ca mg 2598  Iron , Fe mg 32  Sodium, Na mg 2530  Vitamins  Vitamin C , total ascorbic acid  mg 180  Vitamin A, IU IU 9009  Vitamin D  IU 575  Lipids  Fatty acids, total saturated g 43  Fatty acids, total monounsaturated g 56  Fatty acids, total polyunsaturated g 48  Cholesterol mg 252     Heart Failure (Undernourished) Vegan Sample 1-Day Menu View Nutrient Info Breakfast 1 cup oatmeal made with: 1 cup soymilk fortified with calcium , vitamin B12, and vitamin D  2 tablespoons walnuts, unsalted 1 banana 1 cup orange juice with added calcium  and vitamin D   Morning Snack  cup almond butter, unsalted 1 apple  cup granola  cup soy yogurt  Lunch 1 black bean burger, low sodium 1 hamburger bun 1 cup lettuce for salad with:  cup cucumbers, sliced  cup carrots, shredded 1 tablespoon cashews, unsalted 1 tablespoon sesame seed dressing, low sodium  cup pineapple  Afternoon Snack 1 pita, whole wheat  cup hummus  cup grapes  Evening Meal 1 cup cooked rice 1 cup red beans, cooked 1 cup corn, cooked  cup cucumber slices  cup  tomato, diced  Evening Snack Smoothie made with: 1 cup soymilk fortified with calcium , vitamin B12, and vitamin D  1 scoop soy protein powder, for smoothie 1 cup strawberries, for smoothie  Daily Sum Nutrient Unit Value  Macronutrients  Energy kcal 2831  Energy kJ 11850  Protein g 121  Total lipid (fat) g 85  Carbohydrate, by difference g 428  Fiber, total dietary g 71  Sugars, total g 154  Minerals  Calcium , Ca mg 2162  Iron , Fe mg 45  Sodium, Na mg 2054  Vitamins  Vitamin C , total ascorbic acid  mg 296  Vitamin A, IU IU 17869  Vitamin D  IU 318  Lipids  Fatty acids, total saturated g 9  Fatty acids, total monounsaturated g 36  Fatty acids, total polyunsaturated g 30  Cholesterol mg 0     Heart Failure (Undernourished) Vegetarian (Lacto-Ovo) Sample 1-Day Menu View Nutrient Info Breakfast 1 cup oatmeal made with: 1 cup 2% milk 1 slice toast, whole wheat topped with:  cup  avocado 1 cup orange juice with added calcium  and vitamin D   Morning Snack  cup almonds, unsalted 2 tablespoons dried cranberries (add to almonds) 1 cup Greek yogurt, plain  Lunch Sandwich made with: 2 slices bread, whole wheat 2 tablespoons peanut butter, unsalted 1 tablespoon jam 4 carrot sticks with: 2 tablespoons hummus 1 banana  Afternoon Snack  cup granola  cup peanuts, unsalted 1 apple  Evening Meal 1 cup black bean soup, low sodium  cup tofu, cooked  cup cooked brown rice  cup broccoli, cooked  cup carrots, cooked  cup onions, cooked 2 tablespoons peanut oil  Evening Snack 5 crackers, whole wheat, low sodium 3 dried figs 1 cup 2% milk  Daily Sum Nutrient Unit Value  Macronutrients  Energy kcal 3020  Energy kJ 12636  Protein g 117  Total lipid (fat) g 133  Carbohydrate, by difference g 375  Fiber, total dietary g 58  Sugars, total g 148  Minerals  Calcium , Ca mg 2197  Iron , Fe mg 29  Sodium, Na mg 1434  Vitamins  Vitamin C , total ascorbic acid  mg 174  Vitamin A, IU  IU 20295  Vitamin D  IU 317  Lipids  Fatty acids, total saturated g 27  Fatty acids, total monounsaturated g 62  Fatty acids, total polyunsaturated g 34  Cholesterol mg 63    Copyright 2020  Academy of Nutrition and Dietetics. All rights reserved   [facility] Address: [address] [place], [place] [address] Phone Number: [phone] Hours of Operation: Residents of [place] can come to obtain food Monday through Friday from 8:30am until 3:30pm. Photo ID and Social Security cards required for all residents of a household. Can come six times a year  [facility] Address: [address] [place], [place] [address] Phone Number: [phone] Hours of Operation: Operates Tuesday-Friday 10:00 a.m. to 1 p.m. Requirements: Referral from DSS needed. May come 6 times a year, 30 days apart. Photo ID and SS required for all residents of household.  [facility] Address: [address] [place], [place] [address] Phone Number: [phone] Hours of Operation: Food pantry is open on the last Saturday of each month from 10:00 am - 12:00 noon. No appointment needed. No qualifications.  [facility] Address: [address] [place], [place] [address] Phone Number: [phone]. [phone] Hours of Operation: Must make reservations to pick up food on Saturdays. Sign ups for Saturday pick up beginning at 8:30 a.m. on Monday morning.  [facility] Address: [address] [place], [place] [address] Phone Number: [phone] Hours of Operation: If you need food, bring proper identification such as a driver's license to receive a bag of food once a month. Requirements: Can come once every 30 days with referral DSS, Holiday Representative, Mental health etc. Each referral good for six visits. Photo ID required. *1st visit no referral required.  [facility] Address: [address], [place]  [address] Phone Number: [phone]  [place] [facility] Address: [address] [place], [place] [address] Phone Number: [phone] Hours of Operation:  You can register at [email or web address] for free groceries  [facility] Address: [address] [place], [place] [address] Phone Number: [phone] Hours of Operation: Drive through giveaway, first come first served. Every 3rd Saturday 11AM - 1PM  [facility] [address] Address: [address], [place] [address] Phone Number: [phone]   [place]  [facility]  [facility] Address: [address] [place], [place] [address] Phone Number: [phone] Hours of Operation: Once a month every 3rd Saturday  [facility] Address: [address] [place], [place] [address] Phone Number: [phone] Hours of Operation: Distribution happens from 9:00-10:00 a.m. every Saturday.     [facility] Address: [address] [facility] [address] [place], [place] [address] Phone Number: [phone] Hours of Operation: ONCE a week for the community food distribution held every Tuesday, Wednesday and Thursday from 11 a.m. - 2:00 p.m. Food is available on a first come, first serve basis and varies week to week. No appointment necessary for drive thru pick up.  [facility] Address: [address] [place], [place] [address] Phone Number: [phone] Hours of Operation: Open every 3rd Thursday 9:30 a.m. - 11:00 a.m.  [facility] Address: [address] [place], [place] [address] Phone Number: [phone] Hours of Operation: Please call for hours, directions, and questions  [facility] [place] [facility] Address: [address] [place], [place]  [address] Phone Number: [phone] Website: https://www.hollyguns.co.za Food Finder app: https://findfood.ghpfa.org  [employer] Address: [address] [place], [place] [address] Phone Number: [phone] Hours of Operation:  Contact Bree Harpe. Enrolled Substance Abuse Clients Only  [facility] Address: [address] [place], [address]  Phone Number: [phone] Hours of Operation: Contact Bartley Irving. Food pantry open the 3rd Saturday of each month from 9 a.m. -12 p.m. only  [place] [facility] Address: [address] [place], [place] [address] Phone Number: [phone] Hours of Operation: Contact Geni Lee. Emergency food bank open on Saturdays by appointment only  [facility] Address: [address] [place], [place] [address] Phone Number: [phone] Hours of Operation: No specific contact person; Anyone can help  [facility] Address: [address] [place], [place] [address] Phone Number: [phone] Hours of Operation: Contact Medford Molt. Agency gives out a bag of food every Thursday from 2-4 p.m. only, and also provides a community meal every Thursday between 5-6 p.m. Other services provided include rent/mortgage and utility assistance, women's winter shelter, thrift store, and senior adult activities.  [facility] [place] Address: [address] [place], [place] [address] Phone Number: [phone] Hours of Operation: The Emergency Food Assistance Program provides individuals and families with a generous supply of food including meat, fresh vegetables, and nonperishable items. The food box contains five days' worth of food, and each family or individual can receive a box once per month. M, W, Th, Fr 11am-2pm, walk-ins welcome.  [facility] Address: [address] [place], [place] [address]  Phone Number: [phone] Hours of Operation: Contact Asia Cathlean. Tuesdays and Thursdays from 11am - 3pm by appointment only Sunday, by APPOINTMENT ONLY  [address] of [place], [address], [address], [phone], 3.2 mi from [facility], call in advance for  appointment at 10:00am or at 4:00pm, must provide valid photo ID  Monday  9:30am-5:00pm [facility], [address] [place] [phone], [phone], 0.9 mi from [facility], can come four times per year, bring your photo ID and SS cards for other residents of household, will make appointments for those who work and need to come after 5pm  10:00am-12:00noon [employer], [address], [address], [phone], 1.7 mi from [facility], can come once every 60 days per household,  need referral from DSS, [employer], etc., bring photo ID and SS card   10:00am-1:00pm [facility],  [address] [place], [address], [phone], 7.7 mi from [facility], can come once every thirty days with a referral from DSS, [employer], Mental Health, etc. -- each referral good for six visits, bring photo ID   10:00am-1:00pm [facility], [address], [address], [phone], 4.2 mi from [facility], can come once every 6 months, open to [facility] residents, bring photo ID and copy of a current utility bill in your name, please call first to verify that food is available  6:30pm-8:30pm [facility], [address] [address], [phone], 3.2 mi from [facility], can come once every 30 days, maximum 6 times per year, bring your photo ID and SS numbers for other residents of household  Monday by APPOINTMENT ONLY  [facility], [address], [address],  [phone], 2.5 mi from [facility], call in advance for appointment between 10:00am-2:00pm, bring your photo ID and SS cards for all residents of household, can come once every 3 months  [facility], [address], [address], [phone], 0.7 mi from [facility], call in advance for appointment, can come once every 30 days, bring your photo ID and SS cards for other residents of household   Tuesday   9:00am-12:00noon [employer], [address], [address], [phone], 1.3 mi from [facility], can come once every 3 months, bring your photo ID and SS numbers for other residents of household   9:00am-1:00pm [facility] [address],  [place], [address], [phone], 1.6 mi from [facility], can come once every two weeks  9:30am-5:00pm [employer], [address] [place] [phone], [phone], 0.9 mi from [facility], can come four times per year, bring your photo ID and SS cards for other residents of household, will make appointments for those who work and need to come after 5pm  10:00am-12:00noon [employer], [address] [place], [address], [phone], 1.7 mi from [facility], can come once every 60 days per household, need referral from DSS, [employer], etc., bring photo ID and SS card  10:00am-1:00pm [facility], [id number],  [phone], 3.8 mi from [facility], with referral from DSS, may come six times, 30 days apart, bring your photo ID and SS cards for all residents of household   10:00am-1:00pm [address] the [facility], [address] [place], [address], [phone], 7.7 mi from [facility], can come once every thirty days with a referral from DSS, [employer], Mental Health, etc.- each referral good for six visits, bring photo ID   10:00am-1:00pm [address], [address], [phone], 4.2 mi from [facility], can come once every 6 months, open to [facility] residents, bring photo ID and copy of a current utility bill in your name, please call first to verify that food is available  2:00pm-3:30pm [facility], [address], [phone], [phone], 3.7 mi from [facility], can come twelve times per year, one bag per family, bring photo ID   FIRST AND THIRD Tuesdays   10:00am-1:00pm, [facility], [address], [address], [phone]  [phone], 7.2 mi from [facility], can come once every 30 days   Tuesday, WHEN FOOD IS AVAILABLE (call)  12:00noon-2:00pm [facility], [address], [address], [phone], 1.5 mi from  [facility], can come once every 30 days, bring photo ID   Tuesday, by APPOINTMENT ONLY  [facility], [address] [place], [address],  [phone], 2.5 mi from [facility], call in advance for appointment between 1:00pm-4:00pm, bring your photo ID and SS cards for all residents of household, can come once every 3 months   [address] of [address], [address], [address], [phone], 5 mi from [facility], call one day ahead for appointment the next day between 10:00am and 12:00noon, can come once every 3 months, bring your photo ID and must qualify according to family income   [facility] [employer] [facility], [address], [address], [phone], 0.7 mi from [facility], call in advance for appointment, can come once every 30 days, bring your photo ID and SS cards for other residents of household   [address] of [place], [address], [address],  [phone], 5.1 mi from [facility], call between 9:00am and 1:00pm M-F to make appointment. Appointments are scheduled for Tues and Thurs from 10:00am-11:30am, bring photo ID, can come once every 6 months, limit three visits over 18 months, then must have referral  Wednesday  9:30am-5:00pm [facility], [address] [place] [id number], [phone], 0.9 mi from [facility], can come four times per year, bring your photo ID and SS cards for other residents of household, will make appointments for those who work and need to come after 5pm  9:30am-11:30am [employer] [facility], [address], [address], [phone], 6.6 mi from [facility], can come once  every 30 days, bring your photo ID, and SS cards for other residents of household, each monthly visit requires a written referral from GUM or DSS with number in household on form  10:00am-12:00noon [address], [address]  [place], [address], [phone], 1.7 mi from [facility], can come once every 60 days per household, need referral from DSS, [employer], etc., bring photo ID and SS card  10:00am-1:00pm [facility], [id number],  [phone], 3.9 mi from [facility], with referral from DSS, may come six times, 30 days apart, bring your photo ID and SS cards for all residents of household   10:00am-1:00pm [address] the [facility], [address] [place], [address], [phone], 7.7 mi from [facility], can come once every thirty days with a referral from DSS, [employer], Mental Health, etc. - each referral good for six visits, bring photo ID   2:00pm-5:45pm [address], [address] [place], [address], [phone], 3.2 mi from [facility], once every 30 days, first come/first served, limited to first 25, bring photo ID   6:30pm-8:30pm [employer], [address] [address], [phone], 3.2 mi from [facility], can come once every 30 days, maximum 6 times per year, bring your photo ID and SS numbers for other residents of household  FIRST and THIRD Wednesdays   9:00am-12:00noon, [facility], [address], [address], [phone], 4.2 mi from [facility], can come once a month. Please arrive and  sign in no later than 11:15 so everyone can be served by 12 noon.  THIRD Wednesday  1:30pm-3:00pm, [address] [place], [address], [phone] or [phone], 2.1 mi from [facility]  Wednesday, by APPOINTMENT ONLY  [address] [facility], [address] [address], [phone], 5 mi from [facility], call one day  ahead for appointment the next day between 10:00am and 12:00noon, can come once every 3 months, bring your photo ID and must qualify according to family income   [facility], [address], [address], [phone], 0.7 mi from [facility], call in advance for appointment, can come once every 30 days, bring your photo ID and SS cards for other residents of household   [address] of [place], [address] [place], [address], [phone], 3.2 mi from [facility], call in advance for appointment at 7:00pm, must provide valid photo ID  Thursday  9:00am-12:00noon [employer], [address] [address], [phone], 1.3 mi from [facility], can come once every 3 months, bring your photo ID and SS numbers for other residents of household   9:00am-1:00pm [facility] [address],  [place], [address], [phone], 1.6 mi from [facility], can come once every two weeks  9:30am-12:00noon [employer], [address], [address], [phone], 4.5 mi from [facility], can come once every 30 days, must state income [closed Thanksgiving and week of Christmas]  9:30am-5:00pm [employer], [address] [place] [phone], [phone], 0.9 mi from [facility], can come four times per year, bring your photo ID and SS card for other residents of household, will make appointments for those who work and need to come after 5pm  10:00am-1:00pm [employer], [address] [place], [id number],  [phone], 3.9 mi from [facility], with referral from DSS, may come six times, 30 days apart, bring your photo ID and SS cards for all residents of household   10:00am-1:00pm [address] the [facility], [address] [place], [address], [phone], 7.7 mi from [facility], can come once every thirty days with a referral from DSS, [employer], Mental Health, etc.- each referral good for six visits, bring photo ID    10:00am-1:00pm [facility], [address] [address], [phone], 4.2 mi from [facility], can come once every 6 months, open to [facility] residents, bring photo ID and copy of a current utility bill in your name, please call first to verify that food is available  SUNDAYS BREAKFAST TWO LOCATIONS: 8:00am served in [facility] by Awaken Ppl [employer] 8:30am SHUTTLE provided from [facility], served at [employer], [address] LUNCH TWO LOCATIONS [plus one additional third Sunday only] 10:30am - 12:30pm served at [employer], [employer], [place] [address] (1.2 miles from [employer]) 12:30pm served in [place] by Land O'lakes Team (THIRD Sunday only) 1:30pm served at [facility] by [facility] one location [plus one additional third Sunday only] 5:00pm Every Sunday, served under the bridge at [address] by Dovie Under the 3m Company (.7 miles from [facility]) (THIRD Sunday ONLY) 4:00pm served in the parking garage, across from Nucor Corporation, corner of [place] and [place] by Ryland Group Works Ministries MONDAYS BREAKFAST 7:30am served  in [facility] by the [employer] States [employer] and Friends LUNCH 10:30am - 12:30pm served at [employer], [employer], [place] [address] (1.2 miles from Med Atlantic Inc) DINNER TWO LOCATIONS: 7:00pm served in front of the courthouse at the corner of Goldman Sachs and Engelhard Corporation. by [place] Monday Night Meal (3 blocks from Operating Room Services) 4:30pm served at the [employer]onation, [address] by Bank Of [place] Company (0.6 miles from [facility]) TUESDAYS BREAKFAST 8:00am - 9:00am served at The Tjx Companies, [address] (0.3 miles from [place]) LUNCH 10:30am - 12:30pm served at the [employer], [employer] [address] (1.2 miles from  [place]) DINNER 6:00pm served at Csx Corporation, enter from Capital One and go to the Sonic Automotive, (0.7 miles from [place]) [facility] BREAKFAST 7:00am - 8:00am served at [employer], [employer] [address], (1.2 miles from [place]) LUNCH ONE LOCATION [plus two additional locations listed below] 10:30am - 12:30pm served at [employer], [employer] [address] (1.2 miles from [place]) (FIRST Wednesday ONLY) 11:30am served at [employer]illard[employer], [place] [address] (6.6 miles from [place]) (SECOND Wednesday ONLY) 11:00am served at [place]. Garnette Ava Blackwood of [address], [address] (1.3 miles from [place]) [place] TWO LOCATIONS 6:00pm served at W. R. Berkley, [place] W[address] Visteon Corporation. (1.3 miles from [facility]) 4:00pm - 6:00pm (hot dogs and chips) served at Levi Strauss of [facility], [address] (1.7 miles from [place]) [place] BREAKFAST NOT AVAILABLE AT THIS TIME LUNCH 10:30am - 12:30pm served at [employer], [employer], [place] [address], (1.2 miles from [place]) DINNER 6:00pm served at [employer] Corporation, enter from Capital One and go to the Sonic [employer]otive, (0.7 miles from Nucor Corporation) [place] BREAKFAST NOT AVAILABLE AT THIS TIME LUNCH 10:30am - 12:30pm served at [employer], [employer] [address] (1.2 miles from [place]) DINNER TWO LOCATIONS, [plus one additional first Friday only] 6:00pm served under the bridge at [address] by Dovie Under Csx Corporation. (.7 miles from [facility]) 5:00pm - 7:00pm served at Levi Strauss of [facility], [address] (1.7 miles from [place]) (FIRST Friday ONLY) 5:45 pm - SHUTTLE provided from the LIBRARY at 5:45pm. Served at  [facility], [address] SATURDAYS BREAKFAST TWO LOCATIONS [plus one additional last Saturday only] 8:00am served at [facility] [address] by Delphi 8:30am served at [employer], [address] (2.2 miles from [facility]) (LAST Saturday ONLY) 8:30am served at [employer], [address] (5 miles from [place]) LUNCH 10:30am - 12:30pm served at [employer], [employer] [address] (1.2 miles from [facility]) DINNER 6:00pm served under the bridge at [address] by World Fuel Services Corporation (0.7 miles from Nucor Corporation)  DIRECTIONS FROM [place] TO ALL MEAL LOCATIONS The Bridge at [address] (.7 miles from [address]) [address] on [place]. Turn Right onto Directv 433 ft. Continue onto [address] under bridge, about 500 ft. Courthouse (3 blocks from [place]) South on [place]. Turn right on [place]   1 block to Ppl Corporation (.5 miles from [place]) [place] on [place]. Yrc Worldwide. past Brink's Company to Emcor. Enter from Capital One and go to the Affiliated Computer Services building W. R. Berkley [address] Visteon Corporation. (1.3 miles from [facility]) [address] on Central Garage. Turn Right onto [address]. [address]. church will be on the Left. The Tjx Companies [address] (.3 miles from [facility]) Go .3 miles on W. Friend[address] Destination is on your right Dillard's at [place] (6.6 miles from [address]) [address] on [place] toward W Friendly Turn right onto W Friendly Continue onto Alcoa Inc. Continue onto Toll Brothers. 5. Tommi is on right SANMINA-SCI Futures Trader) [address] (.6 miles from [place]) [place] on [place]. [address] Turn Left onto [address] 0.3 miles Destination is on the Left. [place] Itt Industries at American Express (5 miles from Nucor Corporation) 1. Head south on [address]. Turn right onto W Friendly Turn slightly left onto Quest Diagnostics Continue onto Quest Diagnostics Turn right at Barnes & Noble Continue to church on right [place] Sounds of [facility] [address] (1.7 miles from [place]) [address] on [place] 1.4 miles [place] becomes [place]. [address] Continue 0.6 miles and church will be on theright. [place] Guardian Life Insurance at [address] (2.5 miles from Nucor Corporation) [place] provided from Massachusetts Mutual Life Park] [place] on [place]. Elm toward Estée Lauder right onto Costco Wholesale left onto Emerson Electric Turn left onto Micron Technology [address] (2.2 miles from [address]) [address] on [place] 1.4 miles [place] becomes [place]. El[address] Turn right onto W. Humana Inc. and church will be on the Left. Potter's House/Doral At&t [address] (1.2 miles from [facility]) 1.Turn right onto [facility] 2.Turn left onto GEANNIE Beagle 3.Micheline MICAEL Jama 4.Destination is on your right [place]. Garnette Ava Tommi of [address] at [employer]rus (1.3 miles from [facility]) [address] on [address] Turn left onto Genuine Parts right onto S. Maryellen Cassis. Continue onto Kb Home [place]. Turn left onto Smurfit-stone Container. Turn right onto Wellpoint.

## 2024-10-10 NOTE — Progress Notes (Addendum)
 PROGRESS NOTE    Barbara [name]  FMW:[id number] DOB: 10-31-1941 DOA: 10/09/2024 PCP: Johnny Garnette LABOR, MD  Chief Complaint  Patient presents with   Chest Pain   Shortness of Breath    Brief Narrative:   83 y.o. female with medical history significant of  CAD, [place] Gilpatrick.fib on eliquis , HTN, HLD, GERD, CKD3a, SSS sp pacemaker here with chest pain, being treated for NSTEMI.   Assessment & Plan:   Principal Problem:   Unstable angina (HCC) Active Problems:   NSTEMI (non-ST elevated myocardial infarction) (HCC)   Normocytic anemia   CAD (coronary artery disease/elevated troponin   PAF (paroxysmal atrial fibrillation) (HCC)   Primary hypertension   Chronic kidney disease, stage 3b (HCC)   Hyperlipidemia   Gastroesophageal reflux disease   Tachycardia-bradycardia syndrome (HCC)   Failure to thrive in adult   Acute diastolic CHF (congestive heart failure) (HCC)   Hypokalemia   Moderate protein malnutrition   Night sweats  Chest Pain NSTEMI  Coronary Artery Disease Troponin peaked to 105, downtrending  Appreciate cardiology recommendations Echo pending Continue aspirin , heparin  gtt, metoprolol   Anion Gap Metabolic Acidosis Repeat BMP  Hypertension Metoprolol   Atrial Fibrillation Eliquis  on hold Metoprolol , amiodarone   CKD IIIb Creatinine appears close to baseline  Diastolic HF Follow repeat echo  Appears euvolemic  GERD PPI  Tachybrady S/p pacemaker placement  Deconditioning Follow PT/OT - lives alone     DVT prophylaxis: heparin  gtt Code Status: full Family Communication: son Jerod 12/7 Disposition:   Status is: Observation The patient remains OBS appropriate and will d/c before 2 midnights.   Consultants:  cardiology  Procedures:  none  Antimicrobials:  Anti-infectives (From admission, onward)    None       Subjective: No complaints at this time Had 3 episodes of CP, described as feeling like she could feel her heart beating - at  night - improved with nitro  Objective: Vitals:   10/10/24 0334 10/10/24 0751 10/10/24 0904 10/10/24 1100  BP:  (!) 173/83 (!) 165/78 (!) 132/48  Pulse: 60 63 62 64  Resp: (!) 34 18  20  Temp: 99 F (37.2 C) 98.9 F (37.2 C)  98.7 F (37.1 C)  TempSrc:  Oral  Oral  SpO2: 94% 96%  96%  Weight: 60.1 kg     Height:        Intake/Output Summary (Last 24 hours) at 10/10/2024 1418 Last data filed at 10/10/2024 1205 Gross per 24 hour  Intake 1823.46 ml  Output 3000 ml  Net -1176.54 ml   Filed Weights   10/09/24 1409 10/09/24 2042 10/10/24 0334  Weight: 59 kg 61.5 kg 60.1 kg    Examination:  General exam: Appears calm and comfortable  Respiratory system: unlabored Cardiovascular system: RRR - CP is not reproducible  Gastrointestinal system: Abdomen is nondistended, soft and nontender. Central nervous system: Alert and oriented. No focal neurological deficits. Extremities: no LEE    Data Reviewed: I have personally reviewed following labs and imaging studies  CBC: Recent Labs  Lab 10/05/24 1546 10/09/24 1421 10/10/24 0957  WBC 5.3 4.7 4.1  NEUTROABS 4.1  --   --   HGB 13.1 13.7 14.2  HCT 40.3 42.9 44.9  MCV 87.1 88.1 88.7  PLT 129.0* 228 216    Basic Metabolic Panel: Recent Labs  Lab 10/05/24 1546 10/09/24 1421 10/09/24 2010 10/10/24 0957  NA 140 139  --  145  K 3.8 3.1*  --  4.3  CL 107 109  --  111  CO2 23 21*  --  16*  GLUCOSE 79 96  --  112*  BUN 24* 20  --  18  CREATININE 1.22* 1.29*  --  1.38*  CALCIUM  10.3 10.3  --  10.2  MG  --   --  1.2* 2.3  QUANTITY NOT SUFFICIENT, UNABLE TO PERFORM TEST  PHOS  --   --  1.4* 3.5    GFR: Estimated Creatinine Clearance: 28.3 mL/min (Chimere Klingensmith) (by C-G formula based on SCr of 1.38 mg/dL (H)).  Liver Function Tests: Recent Labs  Lab 10/05/24 1546 10/10/24 0957  AST 30 44*  ALT 18 11  ALKPHOS 108 109  BILITOT 1.2 QUANTITY NOT SUFFICIENT, UNABLE TO PERFORM TEST  PROT 6.7 6.5  ALBUMIN 3.3* 2.6*    CBG: No  results for input(s): GLUCAP in the last 168 hours.   Recent Results (from the past 240 hours)  Resp panel by RT-PCR (RSV, Flu Jazzmin Newbold&B, Covid) Anterior Nasal Swab     Status: None   Collection Time: 10/09/24  4:10 PM   Specimen: Anterior Nasal Swab  Result Value Ref Range Status   SARS Coronavirus 2 by RT PCR NEGATIVE NEGATIVE Final   Influenza Caidyn Henricksen by PCR NEGATIVE NEGATIVE Final   Influenza B by PCR NEGATIVE NEGATIVE Final    Comment: (NOTE) The Xpert Xpress SARS-CoV-2/FLU/RSV plus assay is intended as an aid in the diagnosis of influenza from Nasopharyngeal swab specimens and should not be used as Denece Shearer sole basis for treatment. Nasal washings and aspirates are unacceptable for Xpert Xpress SARS-CoV-2/FLU/RSV testing.  Fact Sheet for Patients: bloggercourse.com  Fact Sheet for Healthcare Providers: seriousbroker.it  This test is not yet approved or cleared by the [place]  FDA and has been authorized for detection and/or diagnosis of SARS-CoV-2 by FDA under an Emergency Use Authorization (EUA). This EUA will remain in effect (meaning this test can be used) for the duration of the COVID-19 declaration under Section 564(b)(1) of the Act, 21 U.S.C. section 360bbb-3(b)(1), unless the authorization is terminated or revoked.     Resp Syncytial Virus by PCR NEGATIVE NEGATIVE Final    Comment: (NOTE) Fact Sheet for Patients: bloggercourse.com  Fact Sheet for Healthcare Providers: seriousbroker.it  This test is not yet approved or cleared by the [place]  FDA and has been authorized for detection and/or diagnosis of SARS-CoV-2 by FDA under an Emergency Use Authorization (EUA). This EUA will remain in effect (meaning this test can be used) for the duration of the COVID-19 declaration under Section 564(b)(1) of the Act, 21 U.S.C. section 360bbb-3(b)(1), unless the authorization is  terminated or revoked.  Performed at [facility], [address] [place], [place] [address]   MRSA Next Gen by PCR, Nasal     Status: None   Collection Time: 10/09/24  8:44 PM   Specimen: Urine, Clean Catch; Nasal Swab  Result Value Ref Range Status   MRSA by PCR Next Gen NOT DETECTED NOT DETECTED Final    Comment: (NOTE) The GeneXpert MRSA Assay (FDA approved for NASAL specimens only), is one component of Dellis Voght comprehensive MRSA colonization surveillance program. It is not intended to diagnose MRSA infection nor to guide or monitor treatment for MRSA infections. Test performance is not FDA approved in patients less than 59 years old. Performed at [facility], [address] [place], [place] [address]          Radiology Studies: DG Chest 2 View Result Date: 10/09/2024 CLINICAL DATA:  Low  back pain after fall.  Chest pain. EXAM: CHEST - 2 VIEW, LUMBAR SPINE COMPARISON:  06/20/2024, 07/03/2024. FINDINGS: Chest: The heart is enlarged and the mediastinal contour is within normal limits. Randel Hargens dual lead pacemaker is present over the left chest. There is eventration of the posterior left diaphragm with Camia Dipinto associated atelectasis. No consolidation, effusion, or pneumothorax is seen. Degenerative changes are present in the thoracic spine. Shoulder arthroplasty changes are noted on the right. Lumbar spine: There is no evidence of acute fracture in the lumbar spine. Alignment appears normal. Multilevel intervertebral disc space narrowing, degenerative endplate changes, and facet arthropathy are noted. There is atherosclerotic calcification of the aorta. 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 IMPRESSION: 1. No active cardiopulmonary disease. 2. No acute fracture or subluxation in the lumbar spine. Electronically Signed   By: Leita Birmingham M.D.   On: 10/09/2024 15:18        Scheduled Meds:  (feeding supplement) PROSource Plus  30 mL Oral BID BM   amiodarone   200 mg  Oral Daily   ascorbic acid   500 mg Oral BID   aspirin  EC  81 mg Oral Daily   empagliflozin   10 mg Oral Daily   feeding supplement  237 mL Oral BID BM   furosemide   40 mg Intravenous BID   metoprolol  tartrate  25 mg Oral BID   multivitamin with minerals  1 tablet Oral Daily   pantoprazole   40 mg Oral Daily   sodium chloride  flush  3 mL Intravenous Q12H   sucralfate   1 g Oral BID   thiamine  (VITAMIN B1) injection  100 mg Intravenous Daily   zinc  sulfate (50mg  elemental zinc )  220 mg Oral Daily   Continuous Infusions:  sodium chloride      heparin  700 Units/hr (10/10/24 0500)     LOS: 0 days    Time spent: over 30 min     Meliton Monte, MD [facility]   To contact the attending provider between 7A-7P or the covering provider during after hours 7P-7A, please log into the web site [email or web address] and access using universal [place] password for that web site. If you do not have the password, please call the hospital operator.  10/10/2024, 2:18 PM

## 2024-10-10 NOTE — Progress Notes (Signed)
 Initial Nutrition Assessment  DOCUMENTATION CODES:   Not applicable  INTERVENTION:   -Liberalize diet to 2 gram sodium for wider variety of meal selections -MVI with minerals daily -500 mg vitamin C  BID -220 mg zinc  sulfate daily x 14 days -Continue 100 mg thiamine  daily -Monitor Mg, K, and Phos and replete as needed secondary to refeeding risk -Ensure Plus High Protein po BID, each supplement provides 350 kcal and 20 grams of protein  -30 ml Prosource Plus BID, each supplement provides 100 kcals and 15 grams protein -RD provided Heart Failure Nutrition Therapy for the Undernourished handout from AND's Nutrition Care Manual; attached to AVS/ discharge summary  NUTRITION DIAGNOSIS:   Increased nutrient needs related to chronic illness (CHF, wound healing) as evidenced by estimated needs.  GOAL:   Patient will meet greater than or equal to 90% of their needs  MONITOR:   PO intake, Supplement acceptance  REASON FOR ASSESSMENT:   Consult Assessment of nutrition requirement/status  ASSESSMENT:   83 y.o. female with medical history significant of  CAD, a.fib on eliquis , HTN, HLD, GERD, CKD3a, SSS sp pacemaker admitted for NSTEMI , hypokalemia, and FTT.  Patient admitted with NSTEMI, hypokalemia, and FTT.   Reviewed I/O's: -524 ml x 24 hours  UOP: 2 L x 24 hours  Patient unavailable at time of visit. Attempted to speak with patient via call to hospital room phone, however, unable to reach. RD unable to obtain further nutrition-related history or complete nutrition-focused physical exam at this time.    Per [facility] notes, patient with deep tissue pressure injury to sacrum (dark purple discoloration with one pink dry open wound and scattered tan nonviable tissue present).   Case discussed with RN, who reports patient is trying to eat, but has had a poor appetite for the past few days PTA. She is currently eating breakfast, but currently does not have the appetite for it. Noted  meal completions 30%. Per RN, patient reported being asked to drink 3 Ensure shakes at home, but patient is not able to tolerate this amount of volume.   Reviewed weight history. Noted no weight loss over the past 3 months. Patient with history of CHF and was not taking diuretics PTA. Per nursing notes, patient with moderate edema with lower extremities, which makes weight interpretation difficult. Suspect edema may be masking weight loss as well as potential fat and muscle depletions. Patient is at high risk for malnutrition given advanced age, skin breakdown, poor oral intake, and multiple co-morbidities; she would benefit from addition of oral nutrition supplements.   Medications reviewed and include jardiance , lasix , protonix , and thiamine .  Labs reviewed: Phos: 1.4, Mg: 1.2 (repleted by MD).   Diet Order:   Diet Order             Diet 2 gram sodium Room service appropriate? Yes; Fluid consistency: Thin  Diet effective now                   EDUCATION NEEDS:   Education needs have been addressed  Skin:  Skin Assessment: Skin Integrity Issues: Skin Integrity Issues:: DTI DTI: sacrum  Last BM:  10/08/24  Height:   Ht Readings from Last 1 Encounters:  10/09/24 5' 8 (1.727 m)    Weight:   Wt Readings from Last 1 Encounters:  10/10/24 60.1 kg    Ideal Body Weight:  63.6 kg  BMI:  Body mass index is 20.15 kg/m.  Estimated Nutritional Needs:   Kcal:  8349-8149  Protein:  80-95 grams  Fluid:  1.6-1.8 L    Margery ORN, RD, LDN, CDCES Registered Dietitian III Certified Diabetes Care and Education Specialist If unable to reach this RD, please use RD Inpatient group chat on secure chat between hours of 8am-4 pm daily

## 2024-10-10 NOTE — Progress Notes (Signed)
 Progress Note  Patient Name: Barbara [name] Date of Encounter: 10/10/2024  [facility] Cardiologist: Annabella Scarce, MD   Patient Profile     Subjective   No further chest pain overnight.  Troponin peaked at 105 and trending downward  She put out 2 L yesterday and is net -523 cc since admission  Inpatient Medications    Scheduled Meds:  amiodarone   200 mg Oral Daily   aspirin  EC  81 mg Oral Daily   empagliflozin   10 mg Oral Daily   metoprolol  tartrate  25 mg Oral BID   pantoprazole   40 mg Oral Daily   sodium chloride  flush  3 mL Intravenous Q12H   sucralfate   1 g Oral BID   thiamine  (VITAMIN B1) injection  100 mg Intravenous Daily   Continuous Infusions:  sodium chloride      heparin  700 Units/hr (10/10/24 0500)   PRN Meds: sodium chloride , acetaminophen  **OR** acetaminophen , albuterol , alum & mag hydroxide-simeth, loratadine , ondansetron  **OR** ondansetron  (ZOFRAN ) IV, sodium chloride  flush   Vital Signs    Vitals:   10/09/24 2042 10/10/24 0000 10/10/24 0200 10/10/24 0334  BP: (!) 149/85 112/65 (!) 128/52   Pulse: 61 60 60 60  Resp: 20 18 20  (!) 34  Temp: 98.2 F (36.8 C) (!) 97.4 F (36.3 C)  99 F (37.2 C)  TempSrc: Oral Axillary    SpO2: 94% 95% 96% 94%  Weight: 61.5 kg   60.1 kg  Height: 5' 8 (1.727 m)       Intake/Output Summary (Last 24 hours) at 10/10/2024 0738 Last data filed at 10/10/2024 0500 Gross per 24 hour  Intake 1476.46 ml  Output 2000 ml  Net -523.54 ml      10/10/2024    3:34 AM 10/09/2024    8:42 PM 10/09/2024    2:09 PM  Last 3 Weights  Weight (lbs) 132 lb 7.9 oz 135 lb 9.3 oz 130 lb  Weight (kg) 60.1 kg 61.5 kg 58.968 kg      Telemetry    AV paced rhythm- Personally Reviewed  ECG    No new EKG to review- Personally Reviewed  Physical Exam   GEN: No acute distress.   Neck: No JVD Cardiac: RRR, no murmurs, rubs, or gallops.  Respiratory: crackles at bases bilaterally GI: Soft, nontender, non-distended   MS: trace pedal  edema; No deformity. Neuro:  Nonfocal  Psych: Normal affect   Labs    High Sensitivity Troponin:   Recent Labs  Lab 10/09/24 1702 10/09/24 1832 10/09/24 2010 10/09/24 2135 10/10/24 0332  TROPONINIHS 71* 85* 60* 80* 78*      Chemistry Recent Labs  Lab 10/05/24 1546 10/09/24 1421  NA 140 139  K 3.8 3.1*  CL 107 109  CO2 23 21*  GLUCOSE 79 96  BUN 24* 20  CREATININE 1.22* 1.29*  CALCIUM  10.3 10.3  PROT 6.7  --   ALBUMIN 3.3*  --   AST 30  --   ALT 18  --   ALKPHOS 108  --   BILITOT 1.2  --   GFRNONAA  --  41*  ANIONGAP  --  9     Hematology Recent Labs  Lab 10/05/24 1546 10/09/24 1421  WBC 5.3 4.7  RBC 4.63 4.87  HGB 13.1 13.7  HCT 40.3 42.9  MCV 87.1 88.1  MCH  --  28.1  MCHC 32.5 31.9  RDW 18.2* 17.9*  PLT 129.0* 228    BNPNo results for input(s): BNP,  PROBNP in the last 168 hours.   DDimer No results for input(s): DDIMER in the last 168 hours.   CHA2DS2-VASc Score = 5   This indicates a 7.2% annual risk of stroke. The patient's score is based upon: CHF History: 0 HTN History: 1 Diabetes History: 0 Stroke History: 0 Vascular Disease History: 1 Age Score: 2 Gender Score: 1      Radiology    DG Chest 2 View Result Date: 10/09/2024 CLINICAL DATA:  Low back pain after fall.  Chest pain. EXAM: CHEST - 2 VIEW, LUMBAR SPINE COMPARISON:  06/20/2024, 07/03/2024. FINDINGS: Chest: The heart is enlarged and the mediastinal contour is within normal limits. A dual lead pacemaker is present over the left chest. There is eventration of the posterior left diaphragm with a associated atelectasis. No consolidation, effusion, or pneumothorax is seen. Degenerative changes are present in the thoracic spine. Shoulder arthroplasty changes are noted on the right. Lumbar spine: There is no evidence of acute fracture in the lumbar spine. Alignment appears normal. Multilevel intervertebral disc space narrowing, degenerative endplate changes,  and facet arthropathy are noted. There is atherosclerotic calcification of the aorta. 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 IMPRESSION: 1. No active cardiopulmonary disease. 2. No acute fracture or subluxation in the lumbar spine. Electronically Signed   By: Leita Birmingham M.D.   On: 10/09/2024 15:18    Patient Profile     83 y.o. female with a hx of CAD, apical hokum, OSA, sick sinus syndrome s/p PPM, atrial fibrillation, CKD, hypertension, hyperlipidemia who is being seen 10/10/2024 for the evaluation of NSTEMI at the request of Dr. Dean.   Assessment & Plan    NSTEMI  - Had BMS to LAD in 2000  - Cath in 05/2022 showed mild-moderate nonobstructive CAD, patent LAD stent  - Now presents with episodes of chest pain at night and some with exertion. Relieved with nitroglycerin >> she had a similar admission back in August 25 with mainly chest pain at night and bump in high-sensitivity troponin to the 400s with no cath - hsTn 105. Repeat pending - EKG nonischemic  - Patient has complained of similar chest pain during multiple admissions.  Patient would  likely benefit from cath for definitive evaluation - no further CP - continue IV Heparin  gtt - Check 2D echo to assess LV function and rule out new focal wall motion abnormalities - continue Lopressor  25mg  BID and ASA 81mg  daily - Given this is her second admission in several months for complaints of chest pain at night and some during the day with exertion and a mildly elevated troponin, I think we need to plan left heart cath to define coronary anatomy given that she did have mild to moderate CAD 2 years ago and may have had progression of her disease -Informed Consent   Shared Decision Making/Informed Consent The risks [stroke (1 in 1000), death (1 in 1000), kidney failure [usually temporary] (1 in 500), bleeding (1 in 200), allergic reaction [possibly serious] (1 in 200)], benefits (diagnostic support and management of  coronary artery disease) and alternatives of a cardiac catheterization were discussed in detail with Ms. Marikay and she is willing to proceed.   Acute on Chronic HFpEF  Apical variant HCM  - Echocardiogram from 07/2024 showed EF 70-75%, severe asymmetric LVH, normal RV systolic function, mild MR - Patient recently asked her PCP to stop lasix . It was discontinued on 12/2 - Now presents with chest pain, ongoing shortness of breath - Volume  overloaded on exam with crackles at the bases and pedal edema despite chest x-ray being clear. - Check BNP - BMET pending this am - UOP: 2L yesterday and net neg 523cc since admit - remains volume overloaded so will continue IV Lasix  40mg  BID - follow strict I/Os, daily weights and renal function while diuresing - 2D echo pending - continue Lopressor  25mg  BID, Jardiance  10mg  daily   Atrial Fibrillation  SSS s/p PPM   - Maintaining AV paced rhythm on telemetry and EKG  - continue Amio 200mg  daily and Lopressor  25mg  BID - Eliquis  on hold for cath - continue IV Heparin   Hypokelamia -K+ 3.1 yesterday and repleted yesterday -BMET pending this am   Otherwise per primary  - Weakness, lack of appetite, decreased PO intake  - Pressure sores  - Recent fall with low back pain    I spent 35 minutes caring for this patient today face to face, ordering and reviewing labs, reviewing records from 2D echo 07/27/2024 and cath from 05/2022, seeing the patient, documenting in the record, and arranging for a cardiac cath tomorrow  For questions or updates, please contact  [facility] Please consult [email or web address] for contact info under        Signed, Wilbert Bihari, MD  10/10/2024, 7:38 AM

## 2024-10-10 NOTE — Progress Notes (Addendum)
 ANTICOAGULATION CONSULT NOTE  Pharmacy Consult for Heparin  Indication: chest pain/ACS  Allergies  Allergen Reactions   Lidocaine  Anaphylaxis   Morphine  Other (See Comments)    Body shuts down   Procaine Hcl Anaphylaxis, Rash and Other (See Comments)    Anything with 'caine' in it    Sulfonamide Derivatives Hives   Amlodipine  Swelling   Ezetimibe-Simvastatin Other (See Comments)    VYTORIN = Joint pain   Latex Itching and Other (See Comments)    WELTS   Norco [Hydrocodone -Acetaminophen ] Nausea And Vomiting and Other (See Comments)    States does ok with IV form    Tape Itching and Other (See Comments)    Patient prefers either paper tape or Coban wrap   Tramadol  Nausea And Vomiting    Patient Measurements: Height: 5' 8 (172.7 cm) Weight: 60.1 kg (132 lb 7.9 oz) IBW/kg (Calculated) : 63.9 Heparin  Dosing Weight: 59 kg  Vital Signs: Temp: 98.9 F (37.2 C) (12/07 0751) Temp Source: Oral (12/07 0751) BP: 165/78 (12/07 0904) Pulse Rate: 62 (12/07 0904)  Labs: Recent Labs    10/09/24 1421 10/09/24 1702 10/09/24 2010 10/09/24 2135 10/10/24 0332 10/10/24 0957  HGB 13.7  --   --   --   --  14.2  HCT 42.9  --   --   --   --  44.9  PLT 228  --   --   --   --  216  APTT  --   --  45*  --   --  73*  HEPARINUNFRC  --   --  >1.10*  --   --  1.07*  CREATININE 1.29*  --   --   --   --   --   CKTOTAL  --   --  36*  --   --   --   TROPONINIHS 105*   < > 60* 80* 78*  --    < > = values in this interval not displayed.    Estimated Creatinine Clearance: 30.3 mL/min (A) (by C-G formula based on SCr of 1.29 mg/dL (H)).   Medical History: Past Medical History:  Diagnosis Date   A-fib [facility])    Allergy    CAD (coronary artery disease)    sees Dr. Debby Sor  cardiac stents - 2000   CHF (congestive heart failure) (HCC)    Colon polyps    Complication of anesthesia    rash/hives with caines   Dyspnea    02/12/18  when my heart gets out of rhythm, it has not been out  of rhythym- since I have been on Tikosyn  (11/2017)   Dysrhythmia    afib fib   GERD (gastroesophageal reflux disease)    takes OTC- Omeprazole - prn   Heart murmur    History of kidney stones    History of stress test    show normal perfusion without scar or ischemia, post EF 68%   Hx of echocardiogram    show an EF 55%-60% range with grade 1 diastolic dysfunction, she had mitral anular calcification with mild MR, moderate LA dilation and mild pulmonary hypertension with a PA estimated pressure of 39mm   Hyperlipidemia    Hypertension    NSTEMI (non-ST elevated myocardial infarction) (HCC)    Osteoarthritis    Pacemaker    Pneumonia    hx of 2015    PONV (postoperative nausea and vomiting)    Sleep apnea     Medications: see MAR  Assessment: 83 yof  with a history of AF on Eliquis  (CHA2DS2-VASc =5), HLD, HTN, CAD, GERD, HF. Patient is presenting with SOB and chest pain. Heparin  per pharmacy consult placed for chest pain/ACS.  Last Eliquis  dose 12/6 AM per patient.  HL 1.07 (elevated w/ recent Eliquis ), aPTT 73 therapeutic on 700 units/hr.  CBC stable (Hgb 14.2, pltc 216).  Per cardiology note yesterday, tentative plan to pursue LHC given second hospitalization for CP (first in 06/2024 - no LHC). Last cath 05/2022 with 30% ISR of prox-mid LAD.  Goal of Therapy:  Heparin  level 0.3-0.7 units/ml aPTT 66-102 seconds Monitor platelets by anticoagulation protocol: Yes   Plan:  Continue heparin  IV 700 units/hr Daily CBC, heparin  level, aPTT until correlating F/u cardiology recs / cath plans  Maurilio Fila, PharmD Clinical Pharmacist 10/10/2024  11:35 AM

## 2024-10-11 ENCOUNTER — Encounter (HOSPITAL_COMMUNITY): Payer: Self-pay | Admitting: Cardiovascular Disease

## 2024-10-11 ENCOUNTER — Encounter (HOSPITAL_COMMUNITY)
Admission: EM | Disposition: A | Discharge status: Home Health | Payer: Self-pay | Source: Home / Self Care | Attending: Internal Medicine

## 2024-10-11 DIAGNOSIS — I480 Paroxysmal atrial fibrillation: Secondary | ICD-10-CM | POA: Diagnosis not present

## 2024-10-11 DIAGNOSIS — I2510 Atherosclerotic heart disease of native coronary artery without angina pectoris: Secondary | ICD-10-CM | POA: Diagnosis not present

## 2024-10-11 DIAGNOSIS — I5033 Acute on chronic diastolic (congestive) heart failure: Secondary | ICD-10-CM | POA: Diagnosis not present

## 2024-10-11 DIAGNOSIS — I25118 Atherosclerotic heart disease of native coronary artery with other forms of angina pectoris: Secondary | ICD-10-CM | POA: Diagnosis not present

## 2024-10-11 DIAGNOSIS — I214 Non-ST elevation (NSTEMI) myocardial infarction: Secondary | ICD-10-CM | POA: Diagnosis not present

## 2024-10-11 HISTORY — PX: LEFT HEART CATH AND CORONARY ANGIOGRAPHY: CATH118249

## 2024-10-11 LAB — RENAL FUNCTION PANEL
Albumin: 2.4 g/dL — ABNORMAL LOW (ref 3.5–5.0)
Anion gap: 13 (ref 5–15)
BUN: 26 mg/dL — ABNORMAL HIGH (ref 8–23)
CO2: 25 mmol/L (ref 22–32)
Calcium: 9.8 mg/dL (ref 8.9–10.3)
Chloride: 100 mmol/L (ref 98–111)
Creatinine, Ser: 1.38 mg/dL — ABNORMAL HIGH (ref 0.44–1.00)
GFR, Estimated: 38 mL/min — ABNORMAL LOW (ref >60)
Glucose, Bld: 78 mg/dL (ref 70–99)
Phosphorus: 3.1 mg/dL (ref 2.5–4.6)
Potassium: 3.4 mmol/L — ABNORMAL LOW (ref 3.5–5.1)
Sodium: 138 mmol/L (ref 135–145)

## 2024-10-11 LAB — CBC
HCT: 41.1 % (ref 36.0–46.0)
Hemoglobin: 13.7 g/dL (ref 12.0–15.0)
MCH: 29.1 pg (ref 26.0–34.0)
MCHC: 33.3 g/dL (ref 30.0–36.0)
MCV: 87.4 fL (ref 80.0–100.0)
Platelets: 231 K/uL (ref 150–400)
RBC: 4.7 MIL/uL (ref 3.87–5.11)
RDW: 18.3 % — ABNORMAL HIGH (ref 11.5–15.5)
WBC: 3.8 K/uL — ABNORMAL LOW (ref 4.0–10.5)
nRBC: 0 % (ref 0.0–0.2)

## 2024-10-11 LAB — APTT: aPTT: 95 s — ABNORMAL HIGH (ref 24–36)

## 2024-10-11 LAB — URINE CULTURE: Culture: 30000 — AB

## 2024-10-11 LAB — MAGNESIUM: Magnesium: 1.8 mg/dL (ref 1.7–2.4)

## 2024-10-11 LAB — HEPARIN LEVEL (UNFRACTIONATED): Heparin Unfractionated: 0.54 [IU]/mL (ref 0.30–0.70)

## 2024-10-11 SURGERY — LEFT HEART CATH AND CORONARY ANGIOGRAPHY
Anesthesia: LOCAL

## 2024-10-11 MED ORDER — FREE WATER
500.0000 mL | Freq: Once | Status: AC
  Administered 2024-10-11: 500 mL via ORAL

## 2024-10-11 MED ORDER — HEPARIN SODIUM (PORCINE) 1000 UNIT/ML IJ SOLN
INTRAMUSCULAR | Status: DC | PRN
  Administered 2024-10-11: 3000 [IU] via INTRAVENOUS

## 2024-10-11 MED ORDER — LABETALOL HCL 5 MG/ML IV SOLN: 10.0000 mg | INTRAVENOUS | Status: AC | PRN

## 2024-10-11 MED ORDER — HYDRALAZINE HCL 20 MG/ML IJ SOLN: 10.0000 mg | INTRAMUSCULAR | Status: AC | PRN

## 2024-10-11 MED ORDER — APIXABAN 2.5 MG PO TABS
2.5000 mg | ORAL_TABLET | Freq: Two times a day (BID) | ORAL | Status: DC
  Administered 2024-10-12 – 2024-11-06 (×51): 2.5 mg via ORAL
  Filled 2024-10-11 (×40): qty 1

## 2024-10-11 MED ORDER — HEPARIN (PORCINE) IN NACL 1000-0.9 UT/500ML-% IV SOLN
INTRAVENOUS | Status: DC | PRN
  Administered 2024-10-11 (×2): 500 mL

## 2024-10-11 MED ORDER — IOHEXOL 350 MG/ML SOLN
INTRAVENOUS | Status: DC | PRN
  Administered 2024-10-11: 25 mL

## 2024-10-11 MED ORDER — SODIUM CHLORIDE 0.9 % IV SOLN: INTRAVENOUS | Status: DC

## 2024-10-11 MED ORDER — FENTANYL CITRATE (PF) 100 MCG/2ML IJ SOLN
INTRAMUSCULAR | Status: DC | PRN
  Administered 2024-10-11: 25 ug via INTRAVENOUS

## 2024-10-11 MED ORDER — MIDAZOLAM HCL 2 MG/2ML IJ SOLN
INTRAMUSCULAR | Status: AC
  Filled 2024-10-11: qty 2

## 2024-10-11 MED ORDER — HEPARIN SODIUM (PORCINE) 1000 UNIT/ML IJ SOLN
INTRAMUSCULAR | Status: AC
  Filled 2024-10-11: qty 10

## 2024-10-11 MED ORDER — POTASSIUM CHLORIDE CRYS ER 20 MEQ PO TBCR
40.0000 meq | EXTENDED_RELEASE_TABLET | Freq: Once | ORAL | Status: AC
  Administered 2024-10-11: 40 meq via ORAL
  Filled 2024-10-11: qty 2

## 2024-10-11 MED ORDER — MIDAZOLAM HCL (PF) 2 MG/2ML IJ SOLN
INTRAMUSCULAR | Status: DC | PRN
  Administered 2024-10-11: 1 mg via INTRAVENOUS

## 2024-10-11 MED ORDER — VERAPAMIL HCL 2.5 MG/ML IV SOLN
INTRAVENOUS | Status: AC
  Filled 2024-10-11: qty 2

## 2024-10-11 MED ORDER — SODIUM CHLORIDE 0.9% FLUSH: 3.0000 mL | INTRAVENOUS | Status: DC | PRN

## 2024-10-11 MED ORDER — FENTANYL CITRATE (PF) 100 MCG/2ML IJ SOLN
INTRAMUSCULAR | Status: AC
  Filled 2024-10-11: qty 2

## 2024-10-11 MED ORDER — SODIUM CHLORIDE 0.9 % IV SOLN: 250.0000 mL | INTRAVENOUS | Status: AC | PRN

## 2024-10-11 MED ORDER — ROSUVASTATIN CALCIUM 20 MG PO TABS
20.0000 mg | ORAL_TABLET | Freq: Every day | ORAL | Status: DC
  Administered 2024-10-11: 20 mg via ORAL
  Filled 2024-10-11 (×2): qty 1

## 2024-10-11 MED ORDER — SODIUM CHLORIDE 0.9% FLUSH
3.0000 mL | Freq: Two times a day (BID) | INTRAVENOUS | Status: DC
  Administered 2024-10-11 – 2024-10-23 (×25): 3 mL via INTRAVENOUS

## 2024-10-11 MED ORDER — VERAPAMIL HCL 2.5 MG/ML IV SOLN
INTRAVENOUS | Status: DC | PRN
  Administered 2024-10-11 (×2): 10 mL via INTRA_ARTERIAL

## 2024-10-11 SURGICAL SUPPLY — 7 items
CATH 5FR JL3.5 JR4 ANG PIG MP (CATHETERS) IMPLANT
DEVICE RAD TR BAND REGULAR (VASCULAR PRODUCTS) IMPLANT
GLIDESHEATH SLEND SS 6F .021 (SHEATH) IMPLANT
GUIDEWIRE INQWIRE 1.5J.035X260 (WIRE) IMPLANT
PACK CARDIAC CATHETERIZATION (CUSTOM PROCEDURE TRAY) ×1 IMPLANT
SET ATX-X65L (MISCELLANEOUS) IMPLANT
WIRE HI TORQ VERSACORE-J 145CM (WIRE) IMPLANT

## 2024-10-11 NOTE — H&P (View-Only) (Signed)
 Progress Note  Patient Name: Barbara [name] Date of Encounter: 10/11/2024  [facility] Cardiologist: Annabella Scarce, MD   Patient Profile     Subjective   BP 131/72.  Creatinine stable at 1.38.  Net -2.6 L yesterday, -3.1 L on admission.  Reports chest pain when she walks  Inpatient Medications    Scheduled Meds:  (feeding supplement) PROSource Plus  30 mL Oral BID BM   amiodarone   200 mg Oral Daily   ascorbic acid   500 mg Oral BID   aspirin  EC  81 mg Oral Daily   empagliflozin   10 mg Oral Daily   feeding supplement  237 mL Oral BID BM   free water   500 mL Oral Once   metoprolol  tartrate  25 mg Oral BID   multivitamin with minerals  1 tablet Oral Daily   pantoprazole   40 mg Oral Daily   sodium chloride  flush  3 mL Intravenous Q12H   sucralfate   1 g Oral BID   thiamine  (VITAMIN B1) injection  100 mg Intravenous Daily   zinc  sulfate (50mg  elemental zinc )  220 mg Oral Daily   Continuous Infusions:  heparin  700 Units/hr (10/10/24 0500)   PRN Meds: acetaminophen  **OR** acetaminophen , albuterol , alum & mag hydroxide-simeth, loratadine , ondansetron  **OR** ondansetron  (ZOFRAN ) IV, sodium chloride  flush   Vital Signs    Vitals:   10/11/24 0300 10/11/24 0626 10/11/24 0700 10/11/24 0831  BP: 139/63  131/72 131/72  Pulse: 60  60 62  Resp: 13  19   Temp: 98.4 F (36.9 C)  98.4 F (36.9 C)   TempSrc: Oral  Oral   SpO2: 96%  97%   Weight:  60.3 kg    Height:        Intake/Output Summary (Last 24 hours) at 10/11/2024 0903 Last data filed at 10/10/2024 2247 Gross per 24 hour  Intake 347 ml  Output 3050 ml  Net -2703 ml      10/11/2024    6:26 AM 10/10/2024    3:34 AM 10/09/2024    8:42 PM  Last 3 Weights  Weight (lbs) 132 lb 15 oz 132 lb 7.9 oz 135 lb 9.3 oz  Weight (kg) 60.3 kg 60.1 kg 61.5 kg      Telemetry    AV paced rhythm- Personally Reviewed  ECG    No new EKG to review- Personally Reviewed  Physical Exam   GEN: No acute distress.    Neck: No JVD Cardiac: RRR, no murmurs, rubs, or gallops.  Respiratory: crackles at bases bilaterally GI: Soft, nontender, non-distended  MS: trace pedal  edema; No deformity. Neuro:  Nonfocal  Psych: Normal affect   Labs    High Sensitivity Troponin:   Recent Labs  Lab 10/09/24 1702 10/09/24 1832 10/09/24 2010 10/09/24 2135 10/10/24 0332  TROPONINIHS 71* 85* 60* 80* 78*      Chemistry Recent Labs  Lab 10/05/24 1546 10/09/24 1421 10/10/24 0957 10/10/24 1349 10/11/24 0319  NA 140   < > 145 137 138  K 3.8   < > 4.3 4.1 3.4*  CL 107   < > 111 105 100  CO2 23   < > 16* 24 25  GLUCOSE 79   < > 112* 96 78  BUN 24*   < > 18 22 26*  CREATININE 1.22*   < > 1.38* 1.31* 1.38*  CALCIUM  10.3   < > 10.2 9.5 9.8  PROT 6.7  --  6.5  --   --  ALBUMIN 3.3*  --  2.6*  --  2.4*  AST 30  --  44*  --   --   ALT 18  --  11  --   --   ALKPHOS 108  --  109  --   --   BILITOT 1.2  --  QUANTITY NOT SUFFICIENT, UNABLE TO PERFORM TEST  --   --   GFRNONAA  --    < > 38* 40* 38*  ANIONGAP  --    < > 18* 8 13   < > = values in this interval not displayed.     Hematology Recent Labs  Lab 10/09/24 1421 10/10/24 0957 10/11/24 0319  WBC 4.7 4.1 3.8*  RBC 4.87 5.06 4.70  HGB 13.7 14.2 13.7  HCT 42.9 44.9 41.1  MCV 88.1 88.7 87.4  MCH 28.1 28.1 29.1  MCHC 31.9 31.6 33.3  RDW 17.9* 18.7* 18.3*  PLT 228 216 231    BNPNo results for input(s): BNP, PROBNP in the last 168 hours.   DDimer No results for input(s): DDIMER in the last 168 hours.   CHA2DS2-VASc Score = 5   This indicates a 7.2% annual risk of stroke. The patient's score is based upon: CHF History: 0 HTN History: 1 Diabetes History: 0 Stroke History: 0 Vascular Disease History: 1 Age Score: 2 Gender Score: 1      Radiology    ECHOCARDIOGRAM COMPLETE Result Date: 10/10/2024    ECHOCARDIOGRAM REPORT   Patient Name:   [name] Date of Exam: 10/10/2024 Medical Rec #:  [id number]               Height:       68.0 in Accession #:    [id number]             Weight:       132.5 lb Date of Birth:  June 29, 1941              BSA:          1.716 m Patient Age:    83 years               BP:           132/48 mmHg Patient Gender: F                      HR:           60 bpm. Exam Location:  Inpatient Procedure: 2D Echo, Color Doppler, Cardiac Doppler and Intracardiac            Opacification Agent (Both Spectral and Color Flow Doppler were            utilized during procedure). Indications:    CHF Acute Diastolic I50.31  History:        Patient has prior history of Echocardiogram examinations, most                 recent 05/18/2024.  Sonographer:    Tinnie Gosling RDCS Referring Phys: ROLLO JONELLE LOUDER IMPRESSIONS  1. Small spade like ventricular cavity. 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 Left ventricular ejection fraction, by estimation, is 60 to 65%. The left ventricle has normal function. The left ventricle has no regional wall motion abnormalities. There is mild left ventricular hypertrophy. Left ventricular diastolic parameters are consistent with Grade II diastolic dysfunction (pseudonormalization). Elevated left ventricular  end-diastolic pressure.  2. Device leads in RA/RV. Right ventricular systolic function is normal. The right ventricular size is normal.  3. Left atrial size was moderately dilated.  4. The mitral valve is abnormal. Mild mitral valve regurgitation. No evidence of mitral stenosis.  5. Calcified non coronary cusp. The aortic valve is tricuspid. There is moderate calcification of the aortic valve. There is moderate thickening of the aortic valve. Aortic valve regurgitation is not visualized. Aortic valve sclerosis is present, with no evidence of aortic valve stenosis.  6. The inferior vena cava is normal in size with greater than 50% respiratory variability, suggesting  right atrial pressure of 3 mmHg. FINDINGS  Left Ventricle: Small spade like ventricular cavity. 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 Left ventricular ejection fraction, by  estimation, is 60 to 65%. The left ventricle has normal function. The left ventricle has no regional wall motion abnormalities. Strain was performed and the global longitudinal strain is indeterminate. The left ventricular internal cavity size was normal in size. There is mild left ventricular hypertrophy. Left ventricular diastolic parameters are consistent with Grade II diastolic dysfunction (pseudonormalization). Elevated left ventricular end-diastolic pressure. Right Ventricle: Device leads in RA/RV. The right ventricular size is normal. No increase in right ventricular wall thickness. Right ventricular systolic function is normal. Left Atrium: Left atrial size was moderately dilated. Right Atrium: Right atrial size was normal in size. Pericardium: There is no evidence of pericardial effusion. Mitral Valve: The mitral valve is abnormal. There is mild thickening of the mitral valve leaflet(s). There is mild calcification of the mitral valve leaflet(s). Mild mitral valve regurgitation. No evidence of mitral valve stenosis. Tricuspid Valve: The tricuspid valve is normal in structure. Tricuspid valve regurgitation is mild . No evidence of tricuspid stenosis. Aortic Valve: Calcified non coronary cusp. The aortic valve is tricuspid. There is moderate calcification of the aortic valve. There is moderate thickening of the aortic valve. Aortic valve regurgitation is not visualized. Aortic valve sclerosis is present, with no evidence of aortic valve stenosis. Pulmonic Valve: The pulmonic valve was normal in structure. Pulmonic valve regurgitation is not visualized. No evidence of pulmonic stenosis. Aorta: The aortic root is  normal in size and structure. Venous: The inferior vena cava is normal in size with greater than 50% respiratory variability, suggesting right atrial pressure of 3 mmHg. IAS/Shunts: No atrial level shunt detected by color flow Doppler. Additional Comments: 3D was performed not requiring image post processing on an independent workstation and was indeterminate.  LEFT VENTRICLE PLAX 2D LVIDd:         4.10 cm   Diastology LVIDs:         2.70 cm   LV e' medial:    4.68 cm/s LV PW:         1.20 cm   LV E/e' medial:  18.4 LV IVS:        1.20 cm   LV e' lateral:   6.42 cm/s LVOT diam:     1.60 cm   LV E/e' lateral: 13.4 LV SV:         50 LV SV Index:   29 LVOT Area:     2.01 cm LV IVRT:       113 msec  RIGHT VENTRICLE             IVC RV S prime:     12.40 cm/s  IVC diam: 1.10 cm  LEFT ATRIUM             Index        RIGHT ATRIUM           Index LA diam:        4.30 cm 2.51 cm/m   RA Area:     13.10 cm LA Vol (A2C):   75.1 ml 43.77 ml/m  RA Volume:   31.80 ml  18.54 ml/m LA Vol (A4C):   86.3 ml 50.30 ml/m LA Biplane Vol: 80.4 ml 46.86 ml/m  AORTIC VALVE LVOT Vmax:   138.00 cm/s LVOT Vmean:  94.500 cm/s LVOT VTI:    0.249 m  AORTA Ao Root diam: 2.30 cm Ao Asc diam:  2.80 cm MITRAL VALVE MV Area (PHT): 2.46 cm    SHUNTS MV Decel Time: 308 msec    Systemic VTI:  0.25 m MV E velocity: 85.90 cm/s  Systemic Diam: 1.60 cm MV A velocity: 44.50 cm/s MV E/A ratio:  1.93 Maude Emmer MD Electronically signed by Maude Emmer MD Signature Date/Time: 10/10/2024/3:54:14 PM    Final    DG Chest 2 View Result Date: 10/09/2024 CLINICAL DATA:  Low back pain after fall.  Chest pain. EXAM: CHEST - 2 VIEW, LUMBAR SPINE COMPARISON:  06/20/2024, 07/03/2024. FINDINGS: Chest: The heart is enlarged and the mediastinal contour is within normal limits. A dual lead pacemaker is present over the left chest. There is eventration of the posterior left diaphragm with a associated atelectasis. No consolidation, effusion, or pneumothorax is seen.  Degenerative changes are present in the thoracic spine. Shoulder arthroplasty changes are noted on the right. Lumbar spine: There is no evidence of acute fracture in the lumbar spine. Alignment appears normal. Multilevel intervertebral disc space narrowing, degenerative endplate changes, and facet arthropathy are noted. There is atherosclerotic calcification of the aorta. 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 IMPRESSION: 1. No active cardiopulmonary disease. 2. No acute fracture or subluxation in the lumbar spine. Electronically Signed   By: Leita Birmingham M.D.   On: 10/09/2024 15:18    Patient Profile     83 y.o. female with a hx of CAD, apical hokum, OSA, sick sinus syndrome s/p PPM, atrial fibrillation, CKD, hypertension, hyperlipidemia who is being seen 10/10/2024 for the evaluation of NSTEMI at the request of Dr. Dean.   Assessment & Plan    83 y.o. female with a hx of CAD, apical HCM, OSA, SSS status post PPM, atrial fibrillation, CKD hypertension, hyperlipidemia who presents for follow-up   NSTEMI: History of PCI to LAD with BMS in 2000.  Most recent cath 05/2022 showed patent LAD stent with 30% in-stent restenosis and mild nonobstructive CAD.  Presented with chest pain and mild troponin elevation, peak 105.  Echo with EF 60 to 65%.  Had admission in August with chest pain and A-fib, troponin up to 408. - Continue aspirin  81 mg daily - Continue Lopressor  25 mg twice daily - Continue heparin  drip - Previously on rosuvastatin  but appears was discontinued for unclear reasons, she is unsure why it was stopped.  Will restart rosuvastatin .  Check lipid panel - Reports when she tries to walk she is having chest pain.  Plan LHC today. Risks and benefits of cardiac catheterization have been discussed with the patient.  These include bleeding, infection, kidney damage, stroke, heart attack, death.  The patient understands these risks and is willing to proceed.  Acute on chronic  diastolic heart failure: Echo with EF 60 to  65%, apical hypertrophic cardiomyopathy, G2 DD, mild mitral regurgitation, moderate left atrial enlargement, RAP 3. -She has been diuresed with IV Lasix .  Appears euvolemic.  Will discontinue IV Lasix , plan to transition to p.o. Lasix  tomorrow  Apical HCM: CMR 05/2020 showed apical hypertrophy measuring up to 15 mm, consistent with apical HCM, patchy LGE at apex and RV insertion site (2% of total myocardial mass), normal biventricular size and hyperdynamic systolic function.   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  She has been on on Amio 200 mg daily and Eliquis  2.5 mg twice daily.  Eliquis  currently on hold and on heparin  drip with plans for cath as above   CKD: Creatinine stable at 1.38   Hypertension: On metoprolol  25 mg twice daily   Hyperlipidemia: Restart rosuvastatin  as above  For questions or updates, please contact [facility] Please consult [email or web address] for contact info under        Signed, Lonni LITTIE Nanas, MD  10/11/2024, 9:03 AM

## 2024-10-11 NOTE — Progress Notes (Signed)
 Mobility Specialist Progress Note:    10/11/24 1000  Pain Assessment  Pain Assessment No/denies pain  Mobility  Activity Ambulated with assistance;Pivoted/transferred to/from [facility]  Level of Assistance Moderate assist, patient does 50-74%  Assistive Device Front wheel walker  Distance Ambulated (ft) 3 ft  Activity Response Tolerated well  Mobility Referral Yes  Mobility visit 1 Mobility  Mobility Specialist Start Time (ACUTE ONLY) [id number]  Mobility Specialist Stop Time (ACUTE ONLY) [id number]  Mobility Specialist Time Calculation (min) (ACUTE ONLY) 12 min   Pt received in bed agreeable to mobility. Session limited d/t getting a heart cath soon. Was able to transfer to and from the [facility] w/ ModA. No c/o throughout. Returned to bed w/ fault./ left w/ call bell and personal belongings in reach. All needs met.   Thersia Minder Mobility Specialist  Please contact vis Secure Chat or  Rehab Office [phone]

## 2024-10-11 NOTE — Progress Notes (Signed)
 PROGRESS NOTE    Barbara [name]  FMW:[id number] DOB: 1941/03/24 DOA: 10/09/2024 PCP: Johnny Garnette LABOR, MD  Chief Complaint  Patient presents with   Chest Pain   Shortness of Breath    Brief Narrative:   83 y.o. female with medical history significant of  CAD, Kaylyne Axton.fib on eliquis , HTN, HLD, GERD, CKD3a, SSS sp pacemaker here with chest pain, being treated for NSTEMI.   Assessment & Plan:   Principal Problem:   Unstable angina (HCC) Active Problems:   NSTEMI (non-ST elevated myocardial infarction) (HCC)   Normocytic anemia   CAD (coronary artery disease/elevated troponin   PAF (paroxysmal atrial fibrillation) (HCC)   Primary hypertension   Chronic kidney disease, stage 3b (HCC)   Hyperlipidemia   Gastroesophageal reflux disease   Tachycardia-bradycardia syndrome (HCC)   Failure to thrive in adult   Acute on chronic diastolic heart failure (HCC)   Hypokalemia   Moderate protein malnutrition   Night sweats  Chest Pain NSTEMI  Coronary Artery Disease Troponin peaked to 105, downtrending  Appreciate cardiology recommendations Echo without WMA Continue aspirin , heparin  gtt, metoprolol   Anion Gap Metabolic Acidosis Repeat BMP  Hypertension Metoprolol   Atrial Fibrillation Eliquis  on hold Metoprolol , amiodarone   CKD IIIb Creatinine appears close to baseline  Diastolic HF Apical Hypertrophic Cardiomyopathy Echo with small spade like ventricular cavity, apical cavity obliteration in systole, morphologic findings consistent with apical hypertrophic cardiomyopathy only noted with definity . Grade II diastolic dysfunction.  Preserved EF.   Per cards   Appears euvolemic  GERD PPI  Tachybrady S/p pacemaker placement  Deconditioning Follow PT/OT - lives alone     DVT prophylaxis: heparin  gtt Code Status: full Family Communication: caregiver at bedside Disposition:   Status is: Observation The patient remains OBS appropriate and will d/c before 2  midnights.   Consultants:  cardiology  Procedures:  none  Antimicrobials:  Anti-infectives (From admission, onward)    None       Subjective: Caregiver at bedside No complaints  Objective: Vitals:   10/11/24 0626 10/11/24 0700 10/11/24 0831 10/11/24 1200  BP:  131/72 131/72 (!) 157/61  Pulse:  60 62 60  Resp:  19  18  Temp:  98.4 F (36.9 C)  98.4 F (36.9 C)  TempSrc:  Oral  Oral  SpO2:  97%  96%  Weight: 60.3 kg     Height:        Intake/Output Summary (Last 24 hours) at 10/11/2024 1339 Last data filed at 10/10/2024 2247 Gross per 24 hour  Intake --  Output 2050 ml  Net -2050 ml   Filed Weights   10/09/24 2042 10/10/24 0334 10/11/24 0626  Weight: 61.5 kg 60.1 kg 60.3 kg    Examination:  General: No acute distress. Cardiovascular: RRR Lungs: unlabored Neurological: Alert and oriented 3. Moves all extremities 4 with equal strength. Cranial nerves II through XII grossly intact. Extremities: No clubbing or cyanosis. No edema.    Data Reviewed: I have personally reviewed following labs and imaging studies  CBC: Recent Labs  Lab 10/05/24 1546 10/09/24 1421 10/10/24 0957 10/11/24 0319  WBC 5.3 4.7 4.1 3.8*  NEUTROABS 4.1  --   --   --   HGB 13.1 13.7 14.2 13.7  HCT 40.3 42.9 44.9 41.1  MCV 87.1 88.1 88.7 87.4  PLT 129.0* 228 216 231    Basic Metabolic Panel: Recent Labs  Lab 10/05/24 1546 10/09/24 1421 10/09/24 2010 10/10/24 0957 10/10/24 1349 10/11/24 0319  NA 140 139  --  145 137 138  K 3.8 3.1*  --  4.3 4.1 3.4*  CL 107 109  --  111 105 100  CO2 23 21*  --  16* 24 25  GLUCOSE 79 96  --  112* 96 78  BUN 24* 20  --  18 22 26*  CREATININE 1.22* 1.29*  --  1.38* 1.31* 1.38*  CALCIUM  10.3 10.3  --  10.2 9.5 9.8  MG  --   --  1.2* 2.3  QUANTITY NOT SUFFICIENT, UNABLE TO PERFORM TEST 1.8 1.8  PHOS  --   --  1.4* 3.5  --  3.1    GFR: Estimated Creatinine Clearance: 28.4 mL/min (Amberlin Utke) (by C-G formula based on SCr of 1.38 mg/dL  (H)).  Liver Function Tests: Recent Labs  Lab 10/05/24 1546 10/10/24 0957 10/11/24 0319  AST 30 44*  --   ALT 18 11  --   ALKPHOS 108 109  --   BILITOT 1.2 QUANTITY NOT SUFFICIENT, UNABLE TO PERFORM TEST  --   PROT 6.7 6.5  --   ALBUMIN 3.3* 2.6* 2.4*    CBG: No results for input(s): GLUCAP in the last 168 hours.   Recent Results (from the past 240 hours)  Resp panel by RT-PCR (RSV, Flu Neeva Trew&B, Covid) Anterior Nasal Swab     Status: None   Collection Time: 10/09/24  4:10 PM   Specimen: Anterior Nasal Swab  Result Value Ref Range Status   SARS Coronavirus 2 by RT PCR NEGATIVE NEGATIVE Final   Influenza Cletus Paris by PCR NEGATIVE NEGATIVE Final   Influenza B by PCR NEGATIVE NEGATIVE Final    Comment: (NOTE) The Xpert Xpress SARS-CoV-2/FLU/RSV plus assay is intended as an aid in the diagnosis of influenza from Nasopharyngeal swab specimens and should not be used as Tommy Goostree sole basis for treatment. Nasal washings and aspirates are unacceptable for Xpert Xpress SARS-CoV-2/FLU/RSV testing.  Fact Sheet for Patients: bloggercourse.com  Fact Sheet for Healthcare Providers: seriousbroker.it  This test is not yet approved or cleared by the [place]  FDA and has been authorized for detection and/or diagnosis of SARS-CoV-2 by FDA under an Emergency Use Authorization (EUA). This EUA will remain in effect (meaning this test can be used) for the duration of the COVID-19 declaration under Section 564(b)(1) of the Act, 21 U.S.C. section 360bbb-3(b)(1), unless the authorization is terminated or revoked.     Resp Syncytial Virus by PCR NEGATIVE NEGATIVE Final    Comment: (NOTE) Fact Sheet for Patients: bloggercourse.com  Fact Sheet for Healthcare Providers: seriousbroker.it  This test is not yet approved or cleared by the [place]  FDA and has been authorized for detection and/or  diagnosis of SARS-CoV-2 by FDA under an Emergency Use Authorization (EUA). This EUA will remain in effect (meaning this test can be used) for the duration of the COVID-19 declaration under Section 564(b)(1) of the Act, 21 U.S.C. section 360bbb-3(b)(1), unless the authorization is terminated or revoked.  Performed at [facility], [address], [place], [place] [address]   Urine Culture (for pregnant, neutropenic or urologic patients or patients with an indwelling urinary catheter)     Status: Abnormal   Collection Time: 10/09/24  8:44 PM   Specimen: Urine, Clean Catch  Result Value Ref Range Status   Specimen Description URINE, CLEAN CATCH  Final   Special Requests   Final    NONE Performed at [facility], [address], [place], [place] [address]    Culture 30,000 COLONIES/mL  PROTEUS MIRABILIS (Avnoor Koury)  Final   Report Status 10/11/2024 FINAL  Final   Organism ID, Bacteria PROTEUS MIRABILIS (Adeliz Tonkinson)  Final      Susceptibility   Proteus mirabilis - MIC*    AMPICILLIN <=2 SENSITIVE Sensitive     CEFAZOLIN  (URINE) Value in next row Sensitive      4 SENSITIVEThis is Yasamin Karel modified FDA-approved test that has been validated and its performance characteristics determined by the reporting laboratory.  This laboratory is certified under the Clinical Laboratory Improvement Amendments CLIA as qualified to perform high complexity clinical laboratory testing.    CEFEPIME Value in next row Sensitive      4 SENSITIVEThis is Maytal Mijangos modified FDA-approved test that has been validated and its performance characteristics determined by the reporting laboratory.  This laboratory is certified under the Clinical Laboratory Improvement Amendments CLIA as qualified to perform high complexity clinical laboratory testing.    ERTAPENEM Value in next row Sensitive      4 SENSITIVEThis is Mickeal Daws modified FDA-approved test that has been validated and its performance characteristics determined by the reporting laboratory.   This laboratory is certified under the Clinical Laboratory Improvement Amendments CLIA as qualified to perform high complexity clinical laboratory testing.    CEFTRIAXONE  Value in next row Sensitive      4 SENSITIVEThis is Pranavi Aure modified FDA-approved test that has been validated and its performance characteristics determined by the reporting laboratory.  This laboratory is certified under the Clinical Laboratory Improvement Amendments CLIA as qualified to perform high complexity clinical laboratory testing.    CIPROFLOXACIN  Value in next row Sensitive      4 SENSITIVEThis is Mahek Schlesinger modified FDA-approved test that has been validated and its performance characteristics determined by the reporting laboratory.  This laboratory is certified under the Clinical Laboratory Improvement Amendments CLIA as qualified to perform high complexity clinical laboratory testing.    GENTAMICIN  Value in next row Sensitive      4 SENSITIVEThis is Harbour Nordmeyer modified FDA-approved test that has been validated and its performance characteristics determined by the reporting laboratory.  This laboratory is certified under the Clinical Laboratory Improvement Amendments CLIA as qualified to perform high complexity clinical laboratory testing.    NITROFURANTOIN Value in next row Resistant      4 SENSITIVEThis is Kristine Chahal modified FDA-approved test that has been validated and its performance characteristics determined by the reporting laboratory.  This laboratory is certified under the Clinical Laboratory Improvement Amendments CLIA as qualified to perform high complexity clinical laboratory testing.    TRIMETH/SULFA Value in next row Sensitive      4 SENSITIVEThis is Mingo Siegert modified FDA-approved test that has been validated and its performance characteristics determined by the reporting laboratory.  This laboratory is certified under the Clinical Laboratory Improvement Amendments CLIA as qualified to perform high complexity clinical laboratory testing.     AMPICILLIN/SULBACTAM Value in next row Sensitive      4 SENSITIVEThis is Mahogany Torrance modified FDA-approved test that has been validated and its performance characteristics determined by the reporting laboratory.  This laboratory is certified under the Clinical Laboratory Improvement Amendments CLIA as qualified to perform high complexity clinical laboratory testing.    PIP/TAZO Value in next row Sensitive      <=4 SENSITIVEThis is Yvaine Jankowiak modified FDA-approved test that has been validated and its performance characteristics determined by the reporting laboratory.  This laboratory is certified under the Clinical Laboratory Improvement Amendments CLIA as qualified to perform high complexity clinical laboratory testing.    MEROPENEM  Value in next row Sensitive      <=4 SENSITIVEThis is Velvet Moomaw modified FDA-approved test that has been validated and its performance characteristics determined by the reporting laboratory.  This laboratory is certified under the Clinical Laboratory Improvement Amendments CLIA as qualified to perform high complexity clinical laboratory testing.    * 30,000 COLONIES/mL PROTEUS MIRABILIS  MRSA Next Gen by PCR, Nasal     Status: None   Collection Time: 10/09/24  8:44 PM   Specimen: Urine, Clean Catch; Nasal Swab  Result Value Ref Range Status   MRSA by PCR Next Gen NOT DETECTED NOT DETECTED Final    Comment: (NOTE) The GeneXpert MRSA Assay (FDA approved for NASAL specimens only), is one component of Dontae Minerva comprehensive MRSA colonization surveillance program. It is not intended to diagnose MRSA infection nor to guide or monitor treatment for MRSA infections. Test performance is not FDA approved in patients less than 5 years old. Performed at [facility], [address] [place], [place] [address]          Radiology Studies: ECHOCARDIOGRAM COMPLETE Result Date: 10/10/2024    ECHOCARDIOGRAM REPORT   Patient Name:   Barbara [name] Date of Exam: 10/10/2024 Medical Rec #:  [id number]               Height:       68.0 in Accession #:    [id number]             Weight:       132.5 lb Date of Birth:  November 25, 1940              BSA:          1.716 m Patient Age:    83 years               BP:           132/48 mmHg Patient Gender: F                      HR:           60 bpm. Exam Location:  Inpatient Procedure: 2D Echo, Color Doppler, Cardiac Doppler and Intracardiac            Opacification Agent (Both Spectral and Color Flow Doppler were            utilized during procedure). Indications:    CHF Acute Diastolic I50.31  History:        Patient has prior history of Echocardiogram examinations, most                 recent 05/18/2024.  Sonographer:    Tinnie Gosling RDCS Referring Phys: ROLLO JONELLE LOUDER IMPRESSIONS  1. Small spade like ventricular cavity. 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 Left ventricular ejection fraction, by estimation, is 60 to 65%. The left ventricle has normal function. The left ventricle has no regional wall motion abnormalities. There is mild left ventricular hypertrophy. Left ventricular diastolic parameters are consistent with Grade II diastolic dysfunction (pseudonormalization). Elevated left ventricular end-diastolic pressure.  2. Device leads in RA/RV. Right ventricular systolic function is normal. The right ventricular size is normal.  3. Left atrial size was moderately dilated.  4. The mitral valve is abnormal. Mild mitral valve regurgitation. No evidence of mitral stenosis.  5. Calcified non coronary cusp. The aortic valve is tricuspid. There is moderate  calcification of the aortic valve. There is moderate thickening of the aortic valve. Aortic valve regurgitation is not visualized. Aortic valve sclerosis is present, with no evidence of aortic valve stenosis.  6. The inferior vena cava is normal in size with greater than 50% respiratory variability,  suggesting right atrial pressure of 3 mmHg. FINDINGS  Left Ventricle: Small spade like ventricular cavity. 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 Left ventricular ejection fraction, by  estimation, is 60 to 65%. The left ventricle has normal function. The left ventricle has no regional wall motion abnormalities. Strain was performed and the global longitudinal strain is indeterminate. The left ventricular internal cavity size was normal in size. There is mild left ventricular hypertrophy. Left ventricular diastolic parameters are consistent with Grade II diastolic dysfunction (pseudonormalization). Elevated left ventricular end-diastolic pressure. Right Ventricle: Device leads in RA/RV. The right ventricular size is normal. No increase in right ventricular wall thickness. Right ventricular systolic function is normal. Left Atrium: Left atrial size was moderately dilated. Right Atrium: Right atrial size was normal in size. Pericardium: There is no evidence of pericardial effusion. Mitral Valve: The mitral valve is abnormal. There is mild thickening of the mitral valve leaflet(s). There is mild calcification of the mitral valve leaflet(s). Mild mitral valve regurgitation. No evidence of mitral valve stenosis. Tricuspid Valve: The tricuspid valve is normal in structure. Tricuspid valve regurgitation is mild . No evidence of tricuspid stenosis. Aortic Valve: Calcified non coronary cusp. The aortic valve is tricuspid. There is moderate calcification of the aortic valve. There is moderate thickening of the aortic valve. Aortic valve regurgitation is not visualized. Aortic valve sclerosis is present, with no evidence of aortic valve stenosis. Pulmonic Valve: The pulmonic valve was normal in structure. Pulmonic valve regurgitation is not visualized. No evidence of pulmonic stenosis. Aorta: The aortic  root is normal in size and structure. Venous: The inferior vena cava is normal in size with greater than 50% respiratory variability, suggesting right atrial pressure of 3 mmHg. IAS/Shunts: No atrial level shunt detected by color flow Doppler. Additional Comments: 3D was performed not requiring image post processing on an independent workstation and was indeterminate.  LEFT VENTRICLE PLAX 2D LVIDd:         4.10 cm   Diastology LVIDs:         2.70 cm   LV e' medial:    4.68 cm/s LV PW:         1.20 cm   LV E/e' medial:  18.4 LV IVS:        1.20 cm   LV e' lateral:   6.42 cm/s LVOT diam:     1.60 cm   LV E/e' lateral: 13.4 LV SV:         50 LV SV Index:   29 LVOT Area:     2.01 cm LV IVRT:       113 msec  RIGHT VENTRICLE             IVC RV S prime:     12.40 cm/s  IVC diam: 1.10 cm LEFT ATRIUM             Index        RIGHT ATRIUM           Index LA diam:        4.30 cm 2.51 cm/m   RA Area:     13.10 cm  LA Vol (A2C):   75.1 ml 43.77 ml/m  RA Volume:   31.80 ml  18.54 ml/m LA Vol (A4C):   86.3 ml 50.30 ml/m LA Biplane Vol: 80.4 ml 46.86 ml/m  AORTIC VALVE LVOT Vmax:   138.00 cm/s LVOT Vmean:  94.500 cm/s LVOT VTI:    0.249 m  AORTA Ao Root diam: 2.30 cm Ao Asc diam:  2.80 cm MITRAL VALVE MV Area (PHT): 2.46 cm    SHUNTS MV Decel Time: 308 msec    Systemic VTI:  0.25 m MV E velocity: 85.90 cm/s  Systemic Diam: 1.60 cm MV Izick Gasbarro velocity: 44.50 cm/s MV E/Tamikka Pilger ratio:  1.93 Maude Emmer MD Electronically signed by Maude Emmer MD Signature Date/Time: 10/10/2024/3:54:14 PM    Final    DG Chest 2 View Result Date: 10/09/2024 CLINICAL DATA:  Low back pain after fall.  Chest pain. EXAM: CHEST - 2 VIEW, LUMBAR SPINE COMPARISON:  06/20/2024, 07/03/2024. FINDINGS: Chest: The heart is enlarged and the mediastinal contour is within normal limits. Reeshemah Nazaryan dual lead pacemaker is present over the left chest. There is eventration of the posterior left diaphragm with Marrianne Sica associated atelectasis. No consolidation, effusion, or pneumothorax is  seen. Degenerative changes are present in the thoracic spine. Shoulder arthroplasty changes are noted on the right. Lumbar spine: There is no evidence of acute fracture in the lumbar spine. Alignment appears normal. Multilevel intervertebral disc space narrowing, degenerative endplate changes, and facet arthropathy are noted. There is atherosclerotic calcification of the aorta. 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 IMPRESSION: 1. No active cardiopulmonary disease. 2. No acute fracture or subluxation in the lumbar spine. Electronically Signed   By: Leita Birmingham M.D.   On: 10/09/2024 15:18        Scheduled Meds:  (feeding supplement) PROSource Plus  30 mL Oral BID BM   amiodarone   200 mg Oral Daily   ascorbic acid   500 mg Oral BID   aspirin  EC  81 mg Oral Daily   empagliflozin   10 mg Oral Daily   feeding supplement  237 mL Oral BID BM   free water   500 mL Oral Once   metoprolol  tartrate  25 mg Oral BID   multivitamin with minerals  1 tablet Oral Daily   pantoprazole   40 mg Oral Daily   rosuvastatin   20 mg Oral Daily   sodium chloride  flush  3 mL Intravenous Q12H   sucralfate   1 g Oral BID   thiamine  (VITAMIN B1) injection  100 mg Intravenous Daily   zinc  sulfate (50mg  elemental zinc )  220 mg Oral Daily   Continuous Infusions:  sodium chloride  100 mL/hr at 10/11/24 1228   heparin  700 Units/hr (10/11/24 1034)     LOS: 1 day    Time spent: over 30 min     Meliton Monte, MD [facility]   To contact the attending provider between 7A-7P or the covering provider during after hours 7P-7A, please log into the web site [email or web address] and access using universal [place] password for that web site. If you do not have the password, please call the hospital operator.  10/11/2024, 1:39 PM

## 2024-10-11 NOTE — Progress Notes (Addendum)
 Progress Note  Patient Name: Barbara [name] Date of Encounter: 10/11/2024  [facility] Cardiologist: Annabella Scarce, MD   Patient Profile     Subjective   BP 131/72.  Creatinine stable at 1.38.  Net -2.6 L yesterday, -3.1 L on admission.  Reports chest pain when she walks  Inpatient Medications    Scheduled Meds:  (feeding supplement) PROSource Plus  30 mL Oral BID BM   amiodarone   200 mg Oral Daily   ascorbic acid   500 mg Oral BID   aspirin  EC  81 mg Oral Daily   empagliflozin   10 mg Oral Daily   feeding supplement  237 mL Oral BID BM   free water   500 mL Oral Once   metoprolol  tartrate  25 mg Oral BID   multivitamin with minerals  1 tablet Oral Daily   pantoprazole   40 mg Oral Daily   sodium chloride  flush  3 mL Intravenous Q12H   sucralfate   1 g Oral BID   thiamine  (VITAMIN B1) injection  100 mg Intravenous Daily   zinc  sulfate (50mg  elemental zinc )  220 mg Oral Daily   Continuous Infusions:  heparin  700 Units/hr (10/10/24 0500)   PRN Meds: acetaminophen  **OR** acetaminophen , albuterol , alum & mag hydroxide-simeth, loratadine , ondansetron  **OR** ondansetron  (ZOFRAN ) IV, sodium chloride  flush   Vital Signs    Vitals:   10/11/24 0300 10/11/24 0626 10/11/24 0700 10/11/24 0831  BP: 139/63  131/72 131/72  Pulse: 60  60 62  Resp: 13  19   Temp: 98.4 F (36.9 C)  98.4 F (36.9 C)   TempSrc: Oral  Oral   SpO2: 96%  97%   Weight:  60.3 kg    Height:        Intake/Output Summary (Last 24 hours) at 10/11/2024 0903 Last data filed at 10/10/2024 2247 Gross per 24 hour  Intake 347 ml  Output 3050 ml  Net -2703 ml      10/11/2024    6:26 AM 10/10/2024    3:34 AM 10/09/2024    8:42 PM  Last 3 Weights  Weight (lbs) 132 lb 15 oz 132 lb 7.9 oz 135 lb 9.3 oz  Weight (kg) 60.3 kg 60.1 kg 61.5 kg      Telemetry    AV paced rhythm- Personally Reviewed  ECG    No new EKG to review- Personally Reviewed  Physical Exam   GEN: No acute distress.    Neck: No JVD Cardiac: RRR, no murmurs, rubs, or gallops.  Respiratory: crackles at bases bilaterally GI: Soft, nontender, non-distended  MS: trace pedal  edema; No deformity. Neuro:  Nonfocal  Psych: Normal affect   Labs    High Sensitivity Troponin:   Recent Labs  Lab 10/09/24 1702 10/09/24 1832 10/09/24 2010 10/09/24 2135 10/10/24 0332  TROPONINIHS 71* 85* 60* 80* 78*      Chemistry Recent Labs  Lab 10/05/24 1546 10/09/24 1421 10/10/24 0957 10/10/24 1349 10/11/24 0319  NA 140   < > 145 137 138  K 3.8   < > 4.3 4.1 3.4*  CL 107   < > 111 105 100  CO2 23   < > 16* 24 25  GLUCOSE 79   < > 112* 96 78  BUN 24*   < > 18 22 26*  CREATININE 1.22*   < > 1.38* 1.31* 1.38*  CALCIUM  10.3   < > 10.2 9.5 9.8  PROT 6.7  --  6.5  --   --  ALBUMIN 3.3*  --  2.6*  --  2.4*  AST 30  --  44*  --   --   ALT 18  --  11  --   --   ALKPHOS 108  --  109  --   --   BILITOT 1.2  --  QUANTITY NOT SUFFICIENT, UNABLE TO PERFORM TEST  --   --   GFRNONAA  --    < > 38* 40* 38*  ANIONGAP  --    < > 18* 8 13   < > = values in this interval not displayed.     Hematology Recent Labs  Lab 10/09/24 1421 10/10/24 0957 10/11/24 0319  WBC 4.7 4.1 3.8*  RBC 4.87 5.06 4.70  HGB 13.7 14.2 13.7  HCT 42.9 44.9 41.1  MCV 88.1 88.7 87.4  MCH 28.1 28.1 29.1  MCHC 31.9 31.6 33.3  RDW 17.9* 18.7* 18.3*  PLT 228 216 231    BNPNo results for input(s): BNP, PROBNP in the last 168 hours.   DDimer No results for input(s): DDIMER in the last 168 hours.   CHA2DS2-VASc Score = 5   This indicates a 7.2% annual risk of stroke. The patient's score is based upon: CHF History: 0 HTN History: 1 Diabetes History: 0 Stroke History: 0 Vascular Disease History: 1 Age Score: 2 Gender Score: 1      Radiology    ECHOCARDIOGRAM COMPLETE Result Date: 10/10/2024    ECHOCARDIOGRAM REPORT   Patient Name:   [name] Date of Exam: 10/10/2024 Medical Rec #:  [id number]               Height:       68.0 in Accession #:    [id number]             Weight:       132.5 lb Date of Birth:  June 29, 1941              BSA:          1.716 m Patient Age:    83 years               BP:           132/48 mmHg Patient Gender: F                      HR:           60 bpm. Exam Location:  Inpatient Procedure: 2D Echo, Color Doppler, Cardiac Doppler and Intracardiac            Opacification Agent (Both Spectral and Color Flow Doppler were            utilized during procedure). Indications:    CHF Acute Diastolic I50.31  History:        Patient has prior history of Echocardiogram examinations, most                 recent 05/18/2024.  Sonographer:    Tinnie Gosling RDCS Referring Phys: ROLLO JONELLE LOUDER IMPRESSIONS  1. Small spade like ventricular cavity. 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 Left ventricular ejection fraction, by estimation, is 60 to 65%. The left ventricle has normal function. The left ventricle has no regional wall motion abnormalities. There is mild left ventricular hypertrophy. Left ventricular diastolic parameters are consistent with Grade II diastolic dysfunction (pseudonormalization). Elevated left ventricular  end-diastolic pressure.  2. Device leads in RA/RV. Right ventricular systolic function is normal. The right ventricular size is normal.  3. Left atrial size was moderately dilated.  4. The mitral valve is abnormal. Mild mitral valve regurgitation. No evidence of mitral stenosis.  5. Calcified non coronary cusp. The aortic valve is tricuspid. There is moderate calcification of the aortic valve. There is moderate thickening of the aortic valve. Aortic valve regurgitation is not visualized. Aortic valve sclerosis is present, with no evidence of aortic valve stenosis.  6. The inferior vena cava is normal in size with greater than 50% respiratory variability, suggesting  right atrial pressure of 3 mmHg. FINDINGS  Left Ventricle: Small spade like ventricular cavity. 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 Left ventricular ejection fraction, by  estimation, is 60 to 65%. The left ventricle has normal function. The left ventricle has no regional wall motion abnormalities. Strain was performed and the global longitudinal strain is indeterminate. The left ventricular internal cavity size was normal in size. There is mild left ventricular hypertrophy. Left ventricular diastolic parameters are consistent with Grade II diastolic dysfunction (pseudonormalization). Elevated left ventricular end-diastolic pressure. Right Ventricle: Device leads in RA/RV. The right ventricular size is normal. No increase in right ventricular wall thickness. Right ventricular systolic function is normal. Left Atrium: Left atrial size was moderately dilated. Right Atrium: Right atrial size was normal in size. Pericardium: There is no evidence of pericardial effusion. Mitral Valve: The mitral valve is abnormal. There is mild thickening of the mitral valve leaflet(s). There is mild calcification of the mitral valve leaflet(s). Mild mitral valve regurgitation. No evidence of mitral valve stenosis. Tricuspid Valve: The tricuspid valve is normal in structure. Tricuspid valve regurgitation is mild . No evidence of tricuspid stenosis. Aortic Valve: Calcified non coronary cusp. The aortic valve is tricuspid. There is moderate calcification of the aortic valve. There is moderate thickening of the aortic valve. Aortic valve regurgitation is not visualized. Aortic valve sclerosis is present, with no evidence of aortic valve stenosis. Pulmonic Valve: The pulmonic valve was normal in structure. Pulmonic valve regurgitation is not visualized. No evidence of pulmonic stenosis. Aorta: The aortic root is  normal in size and structure. Venous: The inferior vena cava is normal in size with greater than 50% respiratory variability, suggesting right atrial pressure of 3 mmHg. IAS/Shunts: No atrial level shunt detected by color flow Doppler. Additional Comments: 3D was performed not requiring image post processing on an independent workstation and was indeterminate.  LEFT VENTRICLE PLAX 2D LVIDd:         4.10 cm   Diastology LVIDs:         2.70 cm   LV e' medial:    4.68 cm/s LV PW:         1.20 cm   LV E/e' medial:  18.4 LV IVS:        1.20 cm   LV e' lateral:   6.42 cm/s LVOT diam:     1.60 cm   LV E/e' lateral: 13.4 LV SV:         50 LV SV Index:   29 LVOT Area:     2.01 cm LV IVRT:       113 msec  RIGHT VENTRICLE             IVC RV S prime:     12.40 cm/s  IVC diam: 1.10 cm  LEFT ATRIUM             Index        RIGHT ATRIUM           Index LA diam:        4.30 cm 2.51 cm/m   RA Area:     13.10 cm LA Vol (A2C):   75.1 ml 43.77 ml/m  RA Volume:   31.80 ml  18.54 ml/m LA Vol (A4C):   86.3 ml 50.30 ml/m LA Biplane Vol: 80.4 ml 46.86 ml/m  AORTIC VALVE LVOT Vmax:   138.00 cm/s LVOT Vmean:  94.500 cm/s LVOT VTI:    0.249 m  AORTA Ao Root diam: 2.30 cm Ao Asc diam:  2.80 cm MITRAL VALVE MV Area (PHT): 2.46 cm    SHUNTS MV Decel Time: 308 msec    Systemic VTI:  0.25 m MV E velocity: 85.90 cm/s  Systemic Diam: 1.60 cm MV A velocity: 44.50 cm/s MV E/A ratio:  1.93 Maude Emmer MD Electronically signed by Maude Emmer MD Signature Date/Time: 10/10/2024/3:54:14 PM    Final    DG Chest 2 View Result Date: 10/09/2024 CLINICAL DATA:  Low back pain after fall.  Chest pain. EXAM: CHEST - 2 VIEW, LUMBAR SPINE COMPARISON:  06/20/2024, 07/03/2024. FINDINGS: Chest: The heart is enlarged and the mediastinal contour is within normal limits. A dual lead pacemaker is present over the left chest. There is eventration of the posterior left diaphragm with a associated atelectasis. No consolidation, effusion, or pneumothorax is seen.  Degenerative changes are present in the thoracic spine. Shoulder arthroplasty changes are noted on the right. Lumbar spine: There is no evidence of acute fracture in the lumbar spine. Alignment appears normal. Multilevel intervertebral disc space narrowing, degenerative endplate changes, and facet arthropathy are noted. There is atherosclerotic calcification of the aorta. 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 IMPRESSION: 1. No active cardiopulmonary disease. 2. No acute fracture or subluxation in the lumbar spine. Electronically Signed   By: Leita Birmingham M.D.   On: 10/09/2024 15:18    Patient Profile     83 y.o. female with a hx of CAD, apical hokum, OSA, sick sinus syndrome s/p PPM, atrial fibrillation, CKD, hypertension, hyperlipidemia who is being seen 10/10/2024 for the evaluation of NSTEMI at the request of Dr. Dean.   Assessment & Plan    83 y.o. female with a hx of CAD, apical HCM, OSA, SSS status post PPM, atrial fibrillation, CKD hypertension, hyperlipidemia who presents for follow-up   NSTEMI: History of PCI to LAD with BMS in 2000.  Most recent cath 05/2022 showed patent LAD stent with 30% in-stent restenosis and mild nonobstructive CAD.  Presented with chest pain and mild troponin elevation, peak 105.  Echo with EF 60 to 65%.  Had admission in August with chest pain and A-fib, troponin up to 408. - Continue aspirin  81 mg daily - Continue Lopressor  25 mg twice daily - Continue heparin  drip - Previously on rosuvastatin  but appears was discontinued for unclear reasons, she is unsure why it was stopped.  Will restart rosuvastatin .  Check lipid panel - Reports when she tries to walk she is having chest pain.  Plan LHC today. Risks and benefits of cardiac catheterization have been discussed with the patient.  These include bleeding, infection, kidney damage, stroke, heart attack, death.  The patient understands these risks and is willing to proceed.  Acute on chronic  diastolic heart failure: Echo with EF 60 to  65%, apical hypertrophic cardiomyopathy, G2 DD, mild mitral regurgitation, moderate left atrial enlargement, RAP 3. -She has been diuresed with IV Lasix .  Appears euvolemic.  Will discontinue IV Lasix , plan to transition to p.o. Lasix  tomorrow  Apical HCM: CMR 05/2020 showed apical hypertrophy measuring up to 15 mm, consistent with apical HCM, patchy LGE at apex and RV insertion site (2% of total myocardial mass), normal biventricular size and hyperdynamic systolic function.   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  She has been on on Amio 200 mg daily and Eliquis  2.5 mg twice daily.  Eliquis  currently on hold and on heparin  drip with plans for cath as above   CKD: Creatinine stable at 1.38   Hypertension: On metoprolol  25 mg twice daily   Hyperlipidemia: Restart rosuvastatin  as above  For questions or updates, please contact [facility] Please consult [email or web address] for contact info under        Signed, Lonni LITTIE Nanas, MD  10/11/2024, 9:03 AM

## 2024-10-11 NOTE — NC FL2 (Signed)
 [place]  MEDICAID FL2 LEVEL OF CARE FORM     IDENTIFICATION  Patient Name: Barbara [name] Birthdate: 1941/03/06 Sex: female Admission Date (Current Location): 10/09/2024  [facility] and [place] Number:  Producer, Television/film/video and Address:  [facility], [address], [place], [place] [address]      Provider Number: [id number]  Attending Physician Name and Address:  Perri DELENA Meliton Mickey., *  Relative Name and Phone Number:  Laterrica Libman; Son; [phone]    Current Level of Care: Hospital Recommended Level of Care: Skilled Nursing Facility Prior Approval Number:    Date Approved/Denied:   PASRR Number: [id number]  Discharge Plan: SNF    Current Diagnoses: Patient Active Problem List   Diagnosis Date Noted   Unstable angina (HCC) 10/09/2024   Night sweats 10/09/2024   Paroxysmal atrial fibrillation with rapid ventricular response (HCC) 06/20/2024   Elevated troponin 05/18/2024   Acute on chronic diastolic heart failure (HCC) 05/18/2024   Hypophosphatemia 05/18/2024   Hypokalemia 05/18/2024   Moderate protein malnutrition 05/18/2024   Pancytopenia (HCC) 05/18/2024   Failure to thrive in adult 05/17/2024   Loss of appetite 05/04/2024   Hypercalcemia 04/06/2024   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   Normocytic anemia 03/09/2024   GI bleed 03/09/2024   ABLA (acute blood loss anemia) 03/09/2024   Abnormal weight loss 03/08/2024   Ankle edema, bilateral 11/19/2023   Chronic heart failure with preserved ejection fraction (HFpEF) (HCC) 10/22/2023   Closed displaced fracture of left femoral neck (HCC) 10/22/2023   Closed left subtrochanteric femur fracture (HCC) 09/26/2023   Tenosynovitis of both hands 07/03/2023   Tenosynovitis of both wrists 07/03/2023   Bronchitis 08/04/2022   Ureteral stone 06/26/2022   Renal stones 06/26/2022   S/P cystoscopy 06/25/2022   Nephrolithiasis, 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, 5mm in size, leading to moderate  left hydroureteronephrosis and significant left perinephric stranding /edema 06/09/2022   OSA (obstructive sleep apnea) 06/09/2022   Tachycardia-bradycardia syndrome (HCC)    Acquired trigger finger of right index finger 05/10/2021   Trigger thumb of left hand 05/10/2021   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   Chronic kidney disease, stage 3b (HCC) 05/20/2020   Apical variant hypertrophic cardiomyopathy (HCC)    Chest pain of uncertain etiology    Chest pain    S/P reverse total shoulder arthroplasty, 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   Encounter for orthopedic follow-up care 02/18/2020   PAF (paroxysmal atrial fibrillation) (HCC) 02/28/2019   NSTEMI (non-ST elevated myocardial infarction) (HCC) 02/25/2019   Pain in left knee 01/06/2018   Dysphonia 12/29/2017   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   Gastroesophageal reflux disease 11/06/2017   Bradycardia 12/27/2015   Exertional dyspnea 12/23/2014   Sinus bradycardia 09/13/2014   LUMBAGO 05/03/2010   VERTIGO 01/21/2009   TEMPOROMANDIBULAR JOINT PAIN 08/31/2008   Osteoarthritis 05/31/2008   TROCHANTERIC BURSITIS 05/31/2008   Primary hypertension 04/06/2007   CAD (coronary artery disease/elevated troponin 04/06/2007   ALLERGIC RHINITIS 04/06/2007   History of colonic polyps 04/06/2007   Hyperlipidemia 04/06/2007    Orientation RESPIRATION BLADDER Height & Weight     Self, Situation, Place, Time  Normal (Room Air) Incontinent, External catheter Weight: 132 lb 15 oz (60.3 kg) Height:  5' 8 (172.7 cm)  BEHAVIORAL SYMPTOMS/MOOD NEUROLOGICAL BOWEL NUTRITION STATUS      Incontinent Diet (Please see discharge summary)  AMBULATORY STATUS COMMUNICATION OF  NEEDS Skin   Limited Assist Verbally PU Stage and Appropriate Care (Pressure Injury Sacrum Deep Tissue Pressure Injury - Purple or maroon localized area of discolored intact skin or blood-filled blister due  to damage of underlying soft tissue from pressure and/or shear.)                       Personal Care Assistance Level of Assistance  Bathing, Dressing, Feeding Bathing Assistance: Limited assistance Feeding assistance: Limited assistance Dressing Assistance: Limited assistance     Functional Limitations Info  Sight Sight Info: Impaired (R and L)        SPECIAL CARE FACTORS FREQUENCY  PT (By licensed PT), OT (By licensed OT)     PT Frequency: 5x OT Frequency: 5x            Contractures Contractures Info: Not present    Additional Factors Info  Code Status, Allergies Code Status Info: Full Code Allergies Info: Lidocaine , Morphine , Procaine Hcl, Sulfonamide Derivatives, Amlodipine , Ezetimibe-simvastatin, Latex, Norco (Hydrocodone -acetaminophen ), Tape, Tramadol            Current Medications (10/11/2024):  This is the current hospital active medication list Current Facility-Administered Medications  Medication Dose Route Frequency Provider Last Rate Last Admin   (feeding supplement) PROSource Plus liquid 30 mL  30 mL Oral BID BM Verlin Lonni BIRCH, MD   30 mL at 10/11/24 1600   0.9 %  sodium chloride  infusion   Intravenous Continuous Verlin Lonni BIRCH, MD 100 mL/hr at 10/11/24 1228 New Bag at 10/11/24 1228   0.9 %  sodium chloride  infusion  250 mL Intravenous PRN Verlin Lonni BIRCH, MD       acetaminophen  (TYLENOL ) tablet 650 mg  650 mg Oral Q6H PRN Verlin Lonni BIRCH, MD       Or   acetaminophen  (TYLENOL ) suppository 650 mg  650 mg Rectal Q6H PRN Verlin Lonni BIRCH, MD       albuterol  (PROVENTIL ) (2.5 MG/3ML) 0.083% nebulizer solution 2.5 mg  2.5 mg Nebulization Q2H PRN Verlin Lonni BIRCH, MD       alum & mag hydroxide-simeth (MAALOX/MYLANTA) 200-200-20 MG/5ML suspension 30 mL  30 mL Oral Q4H PRN Verlin Lonni BIRCH, MD       amiodarone  (PACERONE ) tablet 200 mg  200 mg Oral Daily Verlin Lonni BIRCH, MD   200 mg at 10/11/24 9167    ascorbic acid  (VITAMIN C ) tablet 500 mg  500 mg Oral BID Verlin Lonni BIRCH, MD   500 mg at 10/11/24 9167   aspirin  EC tablet 81 mg  81 mg Oral Daily Verlin Lonni BIRCH, MD   81 mg at 10/10/24 9094   empagliflozin  (JARDIANCE ) tablet 10 mg  10 mg Oral Daily Verlin Lonni BIRCH, MD   10 mg at 10/11/24 0831   feeding supplement (ENSURE PLUS HIGH PROTEIN) liquid 237 mL  237 mL Oral BID BM Verlin Lonni BIRCH, MD   237 mL at 10/10/24 0930   hydrALAZINE  (APRESOLINE ) injection 10 mg  10 mg Intravenous Q20 Min PRN Verlin Lonni BIRCH, MD       labetalol  (NORMODYNE ) injection 10 mg  10 mg Intravenous Q10 min PRN Verlin Lonni BIRCH, MD       loratadine  (CLARITIN ) tablet 10 mg  10 mg Oral Daily PRN Verlin Lonni BIRCH, MD       metoprolol  tartrate (LOPRESSOR ) tablet 25 mg  25 mg Oral BID Verlin Lonni BIRCH, MD   25 mg at 10/11/24 0831   multivitamin with minerals  tablet 1 tablet  1 tablet Oral Daily Verlin Lonni BIRCH, MD   1 tablet at 10/11/24 0831   ondansetron  (ZOFRAN ) tablet 4 mg  4 mg Oral Q6H PRN Verlin Lonni BIRCH, MD       Or   ondansetron  (ZOFRAN ) injection 4 mg  4 mg Intravenous Q6H PRN Verlin Lonni BIRCH, MD       pantoprazole  (PROTONIX ) EC tablet 40 mg  40 mg Oral Daily Verlin Lonni BIRCH, MD   40 mg at 10/11/24 0831   rosuvastatin  (CRESTOR ) tablet 20 mg  20 mg Oral Daily Verlin Lonni BIRCH, MD   20 mg at 10/11/24 1229   sodium chloride  flush (NS) 0.9 % injection 3 mL  3 mL Intravenous Q12H Verlin Lonni BIRCH, MD   3 mL at 10/11/24 0834   sodium chloride  flush (NS) 0.9 % injection 3 mL  3 mL Intravenous PRN Verlin Lonni BIRCH, MD       sodium chloride  flush (NS) 0.9 % injection 3 mL  3 mL Intravenous Q12H Verlin Lonni BIRCH, MD   3 mL at 10/11/24 1601   sodium chloride  flush (NS) 0.9 % injection 3 mL  3 mL Intravenous PRN Verlin Lonni BIRCH, MD       sucralfate  (CARAFATE ) 1 GM/10ML suspension 1 g  1 g Oral BID  Verlin Lonni BIRCH, MD   1 g at 10/10/24 2222   thiamine  (VITAMIN B1) injection 100 mg  100 mg Intravenous Daily Verlin Lonni BIRCH, MD   100 mg at 10/11/24 9167   zinc  sulfate (50mg  elemental zinc ) capsule 220 mg  220 mg Oral Daily Verlin Lonni BIRCH, MD   220 mg at 10/11/24 0831     Discharge Medications: Please see discharge summary for a list of discharge medications.  Relevant Imaging Results:  Relevant Lab Results:   Additional Information [id number]  Lauraine FORBES Saa, LCSWA

## 2024-10-11 NOTE — Plan of Care (Signed)
   Problem: Education: Goal: Knowledge of General Education information will improve Description: Including pain rating scale, medication(s)/side effects and non-pharmacologic comfort measures Outcome: Progressing   Problem: Clinical Measurements: Goal: Respiratory complications will improve Outcome: Progressing   Problem: Nutrition: Goal: Adequate nutrition will be maintained Outcome: Progressing

## 2024-10-11 NOTE — Evaluation (Signed)
 Occupational Therapy Evaluation Patient Details Name: Barbara [name] MRN: [id number] DOB: January 28, 1941 Today's Date: 10/11/2024   History of Present Illness   83 yo female adm 12/6 with SOB and chest pain. Admitted for NSTEMI and acute CHF exacerbation. PMH includes: CAD, HFpEF, A-fib s/p PPM, essential HTN, HLD, anxiety, CKD, anemia, Rt TSA, vertigo, Lt femur fx     Clinical Impressions Pt reports having assist from aide 4 hours/day (2 hrs in AM and 2 hrs in PM), to assist with bathing/dressing. Pt mobilizes with RW/rollator. Pt currently needs min-mod A for ADLs, minA for bed mobility and min A for transfers with RW. Pt presenting with impairments listed below, will follow acutely. Patient will benefit from continued inpatient follow up therapy, <3 hours/day to maximize safety/ind with ADL/functional mobility.      If plan is discharge home, recommend the following:   A little help with walking and/or transfers;A lot of help with bathing/dressing/bathroom;Assistance with cooking/housework;Direct supervision/assist for medications management;Direct supervision/assist for financial management;Assist for transportation;Help with stairs or ramp for entrance     Functional Status Assessment   Patient has had a recent decline in their functional status and demonstrates the ability to make significant improvements in function in a reasonable and predictable amount of time.     Equipment Recommendations   None recommended by OT     Recommendations for Other Services   PT consult     Precautions/Restrictions   Precautions Precautions: Fall Recall of Precautions/Restrictions: Intact Restrictions Weight Bearing Restrictions Per Provider Order: No     Mobility Bed Mobility Overal bed mobility: Needs Assistance Bed Mobility: Supine to Sit, Sit to Supine     Supine to sit: Min assist, Used rails, HOB elevated Sit to supine: Min assist        Transfers Overall  transfer level: Needs assistance Equipment used: Rolling walker (2 wheels) Transfers: Sit to/from Stand Sit to Stand: Min assist                  Balance Overall balance assessment: Needs assistance Sitting-balance support: No upper extremity supported, Feet supported Sitting balance-Leahy Scale: Good     Standing balance support: Bilateral upper extremity supported, During functional activity Standing balance-Leahy Scale: Poor Standing balance comment: dependent on BUE support                           ADL either performed or assessed with clinical judgement   ADL Overall ADL's : Needs assistance/impaired Eating/Feeding: Set up   Grooming: Set up;Sitting   Upper Body Bathing: Minimal assistance   Lower Body Bathing: Moderate assistance   Upper Body Dressing : Minimal assistance   Lower Body Dressing: Moderate assistance   Toilet Transfer: Minimal assistance;Rolling walker (2 wheels);Ambulation   Toileting- Clothing Manipulation and Hygiene: Minimal assistance       Functional mobility during ADLs: Minimal assistance;Rolling walker (2 wheels)       Vision   Vision Assessment?: No apparent visual deficits     Perception Perception: Not tested       Praxis Praxis: Not tested       Pertinent Vitals/Pain Pain Assessment Pain Assessment: No/denies pain     Extremity/Trunk Assessment Upper Extremity Assessment Upper Extremity Assessment: Generalized weakness   Lower Extremity Assessment Lower Extremity Assessment: Defer to PT evaluation   Cervical / Trunk Assessment Cervical / Trunk Assessment: Normal;Kyphotic   Communication Communication Communication: No apparent difficulties   Cognition Arousal: Alert Behavior  During Therapy: WFL for tasks assessed/performed Cognition: No apparent impairments                               Following commands: Intact       Cueing  General Comments   Cueing Techniques: Verbal  cues  VSS   Exercises     Shoulder Instructions      Home Living Family/patient expects to be discharged to:: Private residence Living Arrangements: Alone Available Help at Discharge: Family;Available PRN/intermittently;Personal care attendant Type of Home: House Home Access: Stairs to enter [employer] of Steps: 2 Entrance Stairs-Rails: Right Home Layout: One level     Bathroom Shower/Tub: Walk-in shower;Sponge bathes at baseline   Bathroom Toilet: Handicapped height     Home Equipment: Rollator (4 wheels);Rolling Walker (2 wheels);Cane - quad;Shower seat - built in;BSC/3in1;Hand held shower head;Grab bars - tub/shower;Wheelchair - manual;Lift chair;Other (comment) (adjustable bed)   Additional Comments: Has a PCA who comes rom 8-10am and 6-8pm, assists with ADLs and mobility      Prior Functioning/Environment Prior Level of Function : Needs assist             Mobility Comments: RW in the home, has become less and less and mobile over the last two weeks since HHPT d/c. 2 falls in last 6 months. uses rollator when she is trasnporting things ADLs Comments: assist for bathing/dressing, sponge bathes at baseline, wears female catheter at night    OT Problem List: Decreased strength;Decreased range of motion;Decreased activity tolerance;Impaired balance (sitting and/or standing);Decreased cognition;Decreased safety awareness   OT Treatment/Interventions: Self-care/ADL training;Therapeutic exercise;Energy conservation;DME and/or AE instruction;Therapeutic activities;Patient/family education;Balance training      OT Goals(Current goals can be found in the care plan section)   Acute Rehab OT Goals Patient Stated Goal: none stated OT Goal Formulation: With patient Time For Goal Achievement: 10/25/24 Potential to Achieve Goals: Good ADL Goals Pt Will Perform Upper Body Dressing: with modified independence;sitting Pt Will Perform Lower Body Dressing: with min  assist;sitting/lateral leans;sit to/from stand Pt Will Transfer to Toilet: with supervision;ambulating;regular height toilet Pt Will Perform Tub/Shower Transfer: Tub transfer;Shower transfer;with supervision;ambulating   OT Frequency:  Min 2X/week    Co-evaluation              AM-PAC OT 6 Clicks Daily Activity     Outcome Measure Help from another person eating meals?: None Help from another person taking care of personal grooming?: A Little Help from another person toileting, which includes using toliet, bedpan, or urinal?: A Little Help from another person bathing (including washing, rinsing, drying)?: A Lot Help from another person to put on and taking off regular upper body clothing?: A Little Help from another person to put on and taking off regular lower body clothing?: A Lot 6 Click Score: 17   End of Session Equipment Utilized During Treatment: Rolling walker (2 wheels) Nurse Communication: Mobility status  Activity Tolerance: Patient tolerated treatment well Patient left: in bed;with call bell/phone within reach;with bed alarm set;with family/visitor present  OT Visit Diagnosis: Unsteadiness on feet (R26.81);Other abnormalities of gait and mobility (R26.89);Muscle weakness (generalized) (M62.81);History of falling (Z91.81)                Time: 8945-8880 OT Time Calculation (min): 25 min Charges:  OT General Charges $OT Visit: 1 Visit OT Evaluation $OT Eval Moderate Complexity: 1 Mod OT Treatments $Self Care/Home Management : 8-22 mins  Marquez Ceesay K, OTD, OTR/L  [employer] Preferred Acute Rehab [phone]   Laneta MARLA Pereyra 10/11/2024, 12:49 PM

## 2024-10-11 NOTE — Progress Notes (Signed)
 ANTICOAGULATION CONSULT NOTE  Pharmacy Consult for Heparin  Indication: chest pain/ACS  Allergies  Allergen Reactions   Lidocaine  Anaphylaxis   Morphine  Other (See Comments)    Body shuts down   Procaine Hcl Anaphylaxis, Rash and Other (See Comments)    Anything with 'caine' in it    Sulfonamide Derivatives Hives   Amlodipine  Swelling   Ezetimibe-Simvastatin Other (See Comments)    VYTORIN = Joint pain   Latex Itching and Other (See Comments)    WELTS   Norco [Hydrocodone -Acetaminophen ] Nausea And Vomiting and Other (See Comments)    States does ok with IV form    Tape Itching and Other (See Comments)    Patient prefers either paper tape or Coban wrap   Tramadol  Nausea And Vomiting    Patient Measurements: Height: 5' 8 (172.7 cm) Weight: 60.3 kg (132 lb 15 oz) IBW/kg (Calculated) : 63.9 Heparin  Dosing Weight: 59 kg  Vital Signs: Temp: 98.4 F (36.9 C) (12/08 0700) Temp Source: Oral (12/08 0700) BP: 131/72 (12/08 0831) Pulse Rate: 62 (12/08 0831)  Labs: Recent Labs    10/09/24 1421 10/09/24 1702 10/09/24 2010 10/09/24 2135 10/10/24 0332 10/10/24 0957 10/10/24 1349 10/11/24 0319  HGB 13.7  --   --   --   --  14.2  --  13.7  HCT 42.9  --   --   --   --  44.9  --  41.1  PLT 228  --   --   --   --  216  --  231  APTT  --   --  45*  --   --  73*  --  95*  HEPARINUNFRC  --   --  >1.10*  --   --  1.07*  --  0.54  CREATININE 1.29*  --   --   --   --  1.38* 1.31* 1.38*  CKTOTAL  --   --  36*  --   --   --   --   --   TROPONINIHS 105*   < > 60* 80* 78*  --   --   --    < > = values in this interval not displayed.    Estimated Creatinine Clearance: 28.4 mL/min (A) (by C-G formula based on SCr of 1.38 mg/dL (H)).   Medical History: Past Medical History:  Diagnosis Date   A-fib [facility])    Allergy    CAD (coronary artery disease)    sees Dr. Debby Sor  cardiac stents - 2000   CHF (congestive heart failure) (HCC)    Colon polyps    Complication of  anesthesia    rash/hives with caines   Dyspnea    02/12/18  when my heart gets out of rhythm, it has not been out of rhythym- since I have been on Tikosyn  (11/2017)   Dysrhythmia    afib fib   GERD (gastroesophageal reflux disease)    takes OTC- Omeprazole - prn   Heart murmur    History of kidney stones    History of stress test    show normal perfusion without scar or ischemia, post EF 68%   Hx of echocardiogram    show an EF 55%-60% range with grade 1 diastolic dysfunction, she had mitral anular calcification with mild MR, moderate LA dilation and mild pulmonary hypertension with a PA estimated pressure of 39mm   Hyperlipidemia    Hypertension    NSTEMI (non-ST elevated myocardial infarction) (HCC)    Osteoarthritis  Pacemaker    Pneumonia    hx of 2015    PONV (postoperative nausea and vomiting)    Sleep apnea     Medications: see MAR  Assessment: 77 yof with a history of AF on Eliquis  (CHA2DS2-VASc =5), HLD, HTN, CAD, GERD, HF. Patient is presenting with SOB and chest pain. Heparin  per pharmacy consult placed for chest pain/ACS.  Last Eliquis  dose 12/6 AM per patient.  -HL 0.54 (elevated w/ recent Eliquis ), aPTT 95 therapeutic on 700 units/hr.  -plans noted for cath today   Goal of Therapy:  Heparin  level 0.3-0.7 units/ml aPTT 66-102 seconds Monitor platelets by anticoagulation protocol: Yes   Plan:  Continue heparin  IV 700 units/hr Will follow plans post cath  Prentice Poisson, PharmD Clinical Pharmacist **Pharmacist phone directory can now be found on [email or web address] ([email or web address]).  Listed under [facility].

## 2024-10-11 NOTE — TOC Initial Note (Signed)
 Transition of Care [facility]) - Initial/Assessment Note    Patient Details  Name: Barbara [name] MRN: [id number] Date of Birth: October 24, 1941  [facility]) CM/SW Contact:    Lauraine FORBES Saa, LCSWA Phone Number: 10/11/2024, 4:20 PM  Clinical Narrative:                  4:20 PM CSW introduced self and role to patient. Patient's HH aide Charlynn) was also present. CSW informed patient of therapy's recommendation of patient discharging to SNF. Patient was agreeable with recommendation and consented CSW to send referrals to SNFs in [facility]. Patient stated she resides at home alone where she receives assistance from [facility] aide. Patient stated family provides transport for appointments and declined needing housing, transport, and utilities resources, but was agreeable with receiving food assistance resources. CSW provided patient food assistance resources. Per chart review, patient has a PCP and insurance. Patient has SNF history with [facility] and [facility]. Patient has HH history with [facility], and [facility]. Patient has DME (rollator, cane, shower seat, shower bars, CPAP, RW) history with [employer]. Patient's preferred pharmacy's are [facility] and [facility] [address]. CSW sent patient's FL2 to SNFs in [facility]. CSW will continue to follow.  Expected Discharge Plan: Skilled Nursing Facility Barriers to Discharge: Continued Medical Work up, English As A Second Language Teacher, SNF Pending bed offer   Patient Goals and CMS Choice Patient states their goals for this hospitalization and ongoing recovery are:: SNF          Expected Discharge Plan and Services In-house Referral: Clinical Social Work   Post Acute Care Choice: Skilled Nursing Facility Living arrangements for the past 2 months: Single Family Home                                      Prior Living Arrangements/Services Living arrangements for the past 2  months: Single Family Home Lives with:: Self Patient language and need for interpreter reviewed:: Yes        Need for Family Participation in Patient Care: No (Comment) Care giver support system in place?: Yes (comment) Current home services: Homehealth aide, DME Criminal Activity/Legal Involvement Pertinent to Current Situation/Hospitalization: No - Comment as needed  Activities of Daily Living   ADL Screening (condition at time of admission) Independently performs ADLs?: No Does the patient have a NEW difficulty with bathing/dressing/toileting/self-feeding that is expected to last >3 days?: No Does the patient have a NEW difficulty with getting in/out of bed, walking, or climbing stairs that is expected to last >3 days?: No Does the patient have a NEW difficulty with communication that is expected to last >3 days?: No Is the patient deaf or have difficulty hearing?: No Does the patient have difficulty seeing, even when wearing glasses/contacts?: No Does the patient have difficulty concentrating, remembering, or making decisions?: No  Permission Sought/Granted Permission sought to share information with : Magazine Features Editor, Other (comment) (HH Aide) Permission granted to share information with : Yes, Verbal Permission Granted     Permission granted to share info w AGENCY: SNF  Permission granted to share info w Relationship: HH Aide     Emotional Assessment Appearance:: Appears stated age Attitude/Demeanor/Rapport: Engaged Affect (typically observed): Accepting, Appropriate, Adaptable, Calm, Stable, Pleasant Orientation: : Oriented to Self, Oriented to  Time, Oriented to Situation, Oriented to Place Alcohol / Substance Use: Not Applicable  Psych Involvement: No (comment)  Admission diagnosis:  Unstable angina (HCC) [I20.0] Hypokalemia [E87.6] FTT (failure to thrive) in adult [R62.7] NSTEMI (non-ST elevated myocardial infarction) (HCC) [I21.4] Back strain, initial  encounter [S39.012A] Patient Active Problem List   Diagnosis Date Noted   Unstable angina (HCC) 10/09/2024   Night sweats 10/09/2024   Paroxysmal atrial fibrillation with rapid ventricular response (HCC) 06/20/2024   Elevated troponin 05/18/2024   Acute on chronic diastolic heart failure (HCC) 05/18/2024   Hypophosphatemia 05/18/2024   Hypokalemia 05/18/2024   Moderate protein malnutrition 05/18/2024   Pancytopenia (HCC) 05/18/2024   Failure to thrive in adult 05/17/2024   Loss of appetite 05/04/2024   Hypercalcemia 04/06/2024   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   Normocytic anemia 03/09/2024   GI bleed 03/09/2024   ABLA (acute blood loss anemia) 03/09/2024   Abnormal weight loss 03/08/2024   Ankle edema, bilateral 11/19/2023   Chronic heart failure with preserved ejection fraction (HFpEF) (HCC) 10/22/2023   Closed displaced fracture of left femoral neck (HCC) 10/22/2023   Closed left subtrochanteric femur fracture (HCC) 09/26/2023   Tenosynovitis of both hands 07/03/2023   Tenosynovitis of both wrists 07/03/2023   Bronchitis 08/04/2022   Ureteral stone 06/26/2022   Renal stones 06/26/2022   S/P cystoscopy 06/25/2022   Nephrolithiasis, 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, 5mm in size, leading to moderate left hydroureteronephrosis and significant left perinephric stranding /edema 06/09/2022   OSA (obstructive sleep apnea) 06/09/2022   Tachycardia-bradycardia syndrome (HCC)    Acquired trigger finger of right index finger 05/10/2021   Trigger thumb of left hand 05/10/2021   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   Chronic kidney disease, stage 3b (HCC) 05/20/2020   Apical variant hypertrophic cardiomyopathy (HCC)    Chest pain of uncertain etiology    Chest pain    S/P reverse total shoulder arthroplasty, 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   Encounter for orthopedic follow-up care 02/18/2020   PAF (paroxysmal atrial fibrillation) (HCC) 02/28/2019   NSTEMI  (non-ST elevated myocardial infarction) (HCC) 02/25/2019   Pain in left knee 01/06/2018   Dysphonia 12/29/2017   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   Gastroesophageal reflux disease 11/06/2017   Bradycardia 12/27/2015   Exertional dyspnea 12/23/2014   Sinus bradycardia 09/13/2014   LUMBAGO 05/03/2010   VERTIGO 01/21/2009   TEMPOROMANDIBULAR JOINT PAIN 08/31/2008   Osteoarthritis 05/31/2008   TROCHANTERIC BURSITIS 05/31/2008   Primary hypertension 04/06/2007   CAD (coronary artery disease/elevated troponin 04/06/2007   ALLERGIC RHINITIS 04/06/2007   History of colonic polyps 04/06/2007   Hyperlipidemia 04/06/2007   PCP:  Johnny Garnette LABOR, MD Pharmacy:   [facility] [address], [place] - [address] [place] [address] Phone: [phone] Fax: [phone]  [facility] [address] [place] [address] Phone: [phone] Fax: [phone]     Social Drivers of Health (SDOH) Social History: SDOH Screenings   Food Insecurity: No Food Insecurity (10/09/2024)  Housing: High Risk (06/20/2024)  Transportation Needs: Unmet Transportation Needs (06/20/2024)  Utilities: Not At Risk (10/09/2024)  Alcohol Screen: Low Risk  (08/05/2022)  Depression (PHQ2-9): Low Risk  (04/05/2024)  Financial Resource Strain: Low Risk  (08/05/2022)  Physical Activity: Insufficiently Active (07/02/2022)  Social Connections: Moderately Integrated (06/20/2024)  Stress: No Stress Concern Present (07/02/2022)  Tobacco Use: Medium Risk (10/09/2024)   SDOH Interventions: Housing Interventions: Patient Declined Transportation Interventions:  Patient Resources (Friends/Family), PTAR [facility])   Readmission Risk Interventions    05/24/2024    8:57 AM 03/11/2024    1:07 PM  Readmission Risk Prevention Plan  Transportation Screening Complete Complete  PCP or  Specialist Appt within 5-7 Days  Complete  Home Care Screening  Complete  Medication Review (RN CM)  Complete  HRI or Home Care Consult Complete   Social Work Consult for Recovery Care Planning/Counseling Complete   Palliative Care Screening Not Applicable   Medication Review Oceanographer) Complete

## 2024-10-11 NOTE — Progress Notes (Signed)
 S/p cath. Ok to resume apixaban  2.5mg  BID in AM per Dr Verlin.  Sergio Batch, PharmD, BCIDP, AAHIVP, CPP Infectious Disease Pharmacist 10/11/2024 4:49 PM

## 2024-10-11 NOTE — Interval H&P Note (Signed)
 History and Physical Interval Note:  10/11/2024 2:21 PM  Barbara [name]  has presented today for surgery, with the diagnosis of unstable angina.  The various methods of treatment have been discussed with the patient and family. After consideration of risks, benefits and other options for treatment, the patient has consented to  Procedure(s): LEFT HEART CATH AND CORONARY ANGIOGRAPHY (N/A) as a surgical intervention.  The patient's history has been reviewed, patient examined, no change in status, stable for surgery.  I have reviewed the patient's chart and labs.  Questions were answered to the patient's satisfaction.    Cath Lab Visit (complete for each Cath Lab visit)  Clinical Evaluation Leading to the Procedure:   ACS: Yes.    Non-ACS:    Anginal Classification: CCS III  Anti-ischemic medical therapy: Minimal Therapy (1 class of medications)  Non-Invasive Test Results: No non-invasive testing performed  Prior CABG: No previous CABG        Barbara [name]

## 2024-10-12 DIAGNOSIS — I2510 Atherosclerotic heart disease of native coronary artery without angina pectoris: Secondary | ICD-10-CM | POA: Diagnosis not present

## 2024-10-12 DIAGNOSIS — I5033 Acute on chronic diastolic (congestive) heart failure: Secondary | ICD-10-CM | POA: Diagnosis not present

## 2024-10-12 LAB — CBC
HCT: 41.2 % (ref 36.0–46.0)
Hemoglobin: 13.1 g/dL (ref 12.0–15.0)
MCH: 28.5 pg (ref 26.0–34.0)
MCHC: 31.8 g/dL (ref 30.0–36.0)
MCV: 89.8 fL (ref 80.0–100.0)
Platelets: 257 K/uL (ref 150–400)
RBC: 4.59 MIL/uL (ref 3.87–5.11)
RDW: 18.5 % — ABNORMAL HIGH (ref 11.5–15.5)
WBC: 3.8 K/uL — ABNORMAL LOW (ref 4.0–10.5)
nRBC: 0 % (ref 0.0–0.2)

## 2024-10-12 LAB — RENAL FUNCTION PANEL
Albumin: 2.2 g/dL — ABNORMAL LOW (ref 3.5–5.0)
Anion gap: 8 (ref 5–15)
BUN: 26 mg/dL — ABNORMAL HIGH (ref 8–23)
CO2: 23 mmol/L (ref 22–32)
Calcium: 9.3 mg/dL (ref 8.9–10.3)
Chloride: 108 mmol/L (ref 98–111)
Creatinine, Ser: 1.34 mg/dL — ABNORMAL HIGH (ref 0.44–1.00)
GFR, Estimated: 39 mL/min — ABNORMAL LOW (ref >60)
Glucose, Bld: 90 mg/dL (ref 70–99)
Phosphorus: 2.5 mg/dL (ref 2.5–4.6)
Potassium: 4.2 mmol/L (ref 3.5–5.1)
Sodium: 139 mmol/L (ref 135–145)

## 2024-10-12 LAB — LIPID PANEL
Cholesterol: 168 mg/dL (ref 0–200)
HDL: 34 mg/dL — ABNORMAL LOW (ref >40)
LDL Cholesterol: 99 mg/dL (ref 0–99)
Total CHOL/HDL Ratio: 4.9 ratio
Triglycerides: 177 mg/dL — ABNORMAL HIGH (ref <150)
VLDL: 35 mg/dL (ref 0–40)

## 2024-10-12 LAB — VITAMIN B1: Vitamin B1 (Thiamine): 99.5 nmol/L (ref 66.5–200.0)

## 2024-10-12 LAB — MAGNESIUM: Magnesium: 1.8 mg/dL (ref 1.7–2.4)

## 2024-10-12 MED ORDER — ATORVASTATIN CALCIUM 40 MG PO TABS
40.0000 mg | ORAL_TABLET | Freq: Every day | ORAL | Status: DC
  Administered 2024-10-12 – 2024-11-06 (×26): 40 mg via ORAL
  Filled 2024-10-12 (×20): qty 1

## 2024-10-12 MED ORDER — THIAMINE MONONITRATE 100 MG PO TABS
100.0000 mg | ORAL_TABLET | Freq: Every day | ORAL | Status: DC
  Administered 2024-10-12 – 2024-10-18 (×7): 100 mg via ORAL
  Filled 2024-10-12 (×7): qty 1

## 2024-10-12 MED ORDER — ISOSORBIDE MONONITRATE ER 30 MG PO TB24: 30.0000 mg | ORAL_TABLET | Freq: Every day | ORAL | Status: DC

## 2024-10-12 NOTE — Progress Notes (Signed)
 PROGRESS NOTE    Barbara [name]  FMW:[id number] DOB: 1941-10-03 DOA: 10/09/2024 PCP: Johnny Garnette LABOR, MD  Chief Complaint  Patient presents with   Chest Pain   Shortness of Breath    Brief Narrative:   83 y.o. female with medical history significant of  CAD, Aolanis Crispen.fib on eliquis , HTN, HLD, GERD, CKD3a, SSS sp pacemaker here with chest pain, being treated for NSTEMI.   Assessment & Plan:   Principal Problem:   Unstable angina (HCC) Active Problems:   NSTEMI (non-ST elevated myocardial infarction) (HCC)   Normocytic anemia   CAD (coronary artery disease/elevated troponin   PAF (paroxysmal atrial fibrillation) (HCC)   Primary hypertension   Chronic kidney disease, stage 3b (HCC)   Hyperlipidemia   Gastroesophageal reflux disease   Tachycardia-bradycardia syndrome (HCC)   Failure to thrive in adult   Acute on chronic diastolic heart failure (HCC)   Hypokalemia   Moderate protein malnutrition   Night sweats  Chest Pain NSTEMI  Coronary Artery Disease Troponin peaked to 105, downtrending  Appreciate cardiology recommendations Echo without WMA S/p LHC with patent proximal LAD stent with minimal restenosis, mild nonobstructive disease in the mid circumflex at the ostium of the obtuse marginal branch, mild non obstructive disease in the mid RCA - recommending medical management Aspirin , lipitor, metoprolol    Anion Gap Metabolic Acidosis resolved  Hypertension Metoprolol   Atrial Fibrillation Eliquis   Metoprolol , amiodarone   CKD IIIb Creatinine appears close to baseline  Diastolic HF Apical Hypertrophic Cardiomyopathy Echo with small spade like ventricular cavity, apical cavity obliteration in systole, morphologic findings consistent with apical hypertrophic cardiomyopathy only noted with definity . Grade II diastolic dysfunction.  Preserved EF.   Per cards   Appears euvolemic - cards recommending prn lasix  at home, jardiance   GERD PPI  Tachybrady S/p  pacemaker placement  Deconditioning Follow PT/OT - lives alone     DVT prophylaxis: heparin  gtt Code Status: full Family Communication: caregiver at bedside Disposition:   Status is: Observation The patient remains OBS appropriate and will d/c before 2 midnights.   Consultants:  cardiology  Procedures:  none  Antimicrobials:  Anti-infectives (From admission, onward)    None       Subjective: Son on phone when I was at bedside - asks what the therapist said about her (I conveyed what was reflected in their notes) Denies complaints  Objective: Vitals:   10/11/24 2301 10/12/24 0330 10/12/24 0729 10/12/24 0833  BP: 118/61   (!) 135/51  Pulse: 60   60  Resp: (!) 27 17 18    Temp: (!) 97.4 F (36.3 C)  98.3 F (36.8 C)   TempSrc: Oral Oral Oral   SpO2: 95%     Weight:  63.7 kg    Height:        Intake/Output Summary (Last 24 hours) at 10/12/2024 0929 Last data filed at 10/12/2024 0733 Gross per 24 hour  Intake 500 ml  Output 620 ml  Net -120 ml   Filed Weights   10/10/24 0334 10/11/24 0626 10/12/24 0330  Weight: 60.1 kg 60.3 kg 63.7 kg    Examination:  General: No acute distress. Cardiovascular: RRR Lungs: unlabored Neurological: Alert and oriented 3. Moves all extremities 4 with equal strength. Cranial nerves II through XII grossly intact. Extremities: No clubbing or cyanosis. No edema.  Data Reviewed: I have personally reviewed following labs and imaging studies  CBC: Recent Labs  Lab 10/05/24 1546 10/09/24 1421 10/10/24 0957 10/11/24 0319 10/12/24 0214  WBC 5.3 4.7 4.1  3.8* 3.8*  NEUTROABS 4.1  --   --   --   --   HGB 13.1 13.7 14.2 13.7 13.1  HCT 40.3 42.9 44.9 41.1 41.2  MCV 87.1 88.1 88.7 87.4 89.8  PLT 129.0* 228 216 231 257    Basic Metabolic Panel: Recent Labs  Lab 10/09/24 1421 10/09/24 2010 10/10/24 0957 10/10/24 1349 10/11/24 0319 10/12/24 0214  NA 139  --  145 137 138 139  K 3.1*  --  4.3 4.1 3.4* 4.2  CL 109  --   111 105 100 108  CO2 21*  --  16* 24 25 23   GLUCOSE 96  --  112* 96 78 90  BUN 20  --  18 22 26* 26*  CREATININE 1.29*  --  1.38* 1.31* 1.38* 1.34*  CALCIUM  10.3  --  10.2 9.5 9.8 9.3  MG  --  1.2* 2.3  QUANTITY NOT SUFFICIENT, UNABLE TO PERFORM TEST 1.8 1.8 1.8  PHOS  --  1.4* 3.5  --  3.1 2.5    GFR: Estimated Creatinine Clearance: 30.9 mL/min (Khalin Royce) (by C-G formula based on SCr of 1.34 mg/dL (H)).  Liver Function Tests: Recent Labs  Lab 10/05/24 1546 10/10/24 0957 10/11/24 0319 10/12/24 0214  AST 30 44*  --   --   ALT 18 11  --   --   ALKPHOS 108 109  --   --   BILITOT 1.2 QUANTITY NOT SUFFICIENT, UNABLE TO PERFORM TEST  --   --   PROT 6.7 6.5  --   --   ALBUMIN 3.3* 2.6* 2.4* 2.2*    CBG: No results for input(s): GLUCAP in the last 168 hours.   Recent Results (from the past 240 hours)  Resp panel by RT-PCR (RSV, Flu Ollivander See&B, Covid) Anterior Nasal Swab     Status: None   Collection Time: 10/09/24  4:10 PM   Specimen: Anterior Nasal Swab  Result Value Ref Range Status   SARS Coronavirus 2 by RT PCR NEGATIVE NEGATIVE Final   Influenza Caitriona Sundquist by PCR NEGATIVE NEGATIVE Final   Influenza B by PCR NEGATIVE NEGATIVE Final    Comment: (NOTE) The Xpert Xpress SARS-CoV-2/FLU/RSV plus assay is intended as an aid in the diagnosis of influenza from Nasopharyngeal swab specimens and should not be used as Trayce Maino sole basis for treatment. Nasal washings and aspirates are unacceptable for Xpert Xpress SARS-CoV-2/FLU/RSV testing.  Fact Sheet for Patients: bloggercourse.com  Fact Sheet for Healthcare Providers: seriousbroker.it  This test is not yet approved or cleared by the [place]  FDA and has been authorized for detection and/or diagnosis of SARS-CoV-2 by FDA under an Emergency Use Authorization (EUA). This EUA will remain in effect (meaning this test can be used) for the duration of the COVID-19 declaration under Section 564(b)(1)  of the Act, 21 U.S.C. section 360bbb-3(b)(1), unless the authorization is terminated or revoked.     Resp Syncytial Virus by PCR NEGATIVE NEGATIVE Final    Comment: (NOTE) Fact Sheet for Patients: bloggercourse.com  Fact Sheet for Healthcare Providers: seriousbroker.it  This test is not yet approved or cleared by the [place]  FDA and has been authorized for detection and/or diagnosis of SARS-CoV-2 by FDA under an Emergency Use Authorization (EUA). This EUA will remain in effect (meaning this test can be used) for the duration of the COVID-19 declaration under Section 564(b)(1) of the Act, 21 U.S.C. section 360bbb-3(b)(1), unless the authorization is terminated or revoked.  Performed at [facility], [address] [place], [place] [address]   Urine Culture (for pregnant, neutropenic or urologic patients or patients with an indwelling urinary catheter)     Status: Abnormal   Collection Time: 10/09/24  8:44 PM   Specimen: Urine, Clean Catch  Result Value Ref Range Status   Specimen Description URINE, CLEAN CATCH  Final   Special Requests   Final    NONE Performed at [facility], [address], [place], [place] [address]    Culture 30,000 COLONIES/mL PROTEUS MIRABILIS (Kaston Faughn)  Final   Report Status 10/11/2024 FINAL  Final   Organism ID, Bacteria PROTEUS MIRABILIS (Emeterio Balke)  Final      Susceptibility   Proteus mirabilis - MIC*    AMPICILLIN <=2 SENSITIVE Sensitive     CEFAZOLIN  (URINE) Value in next row Sensitive      4 SENSITIVEThis is Dimitry Holsworth modified FDA-approved test that has been validated and its performance characteristics determined by the reporting laboratory.  This laboratory is certified under the Clinical Laboratory Improvement Amendments CLIA as qualified to perform high complexity clinical laboratory testing.    CEFEPIME Value in next row Sensitive      4 SENSITIVEThis is Rosanne Wohlfarth modified FDA-approved test that has  been validated and its performance characteristics determined by the reporting laboratory.  This laboratory is certified under the Clinical Laboratory Improvement Amendments CLIA as qualified to perform high complexity clinical laboratory testing.    ERTAPENEM Value in next row Sensitive      4 SENSITIVEThis is Cristiana Yochim modified FDA-approved test that has been validated and its performance characteristics determined by the reporting laboratory.  This laboratory is certified under the Clinical Laboratory Improvement Amendments CLIA as qualified to perform high complexity clinical laboratory testing.    CEFTRIAXONE  Value in next row Sensitive      4 SENSITIVEThis is Ashira Kirsten modified FDA-approved test that has been validated and its performance characteristics determined by the reporting laboratory.  This laboratory is certified under the Clinical Laboratory Improvement Amendments CLIA as qualified to perform high complexity clinical laboratory testing.    CIPROFLOXACIN  Value in next row Sensitive      4 SENSITIVEThis is Jalien Weakland modified FDA-approved test that has been validated and its performance characteristics determined by the reporting laboratory.  This laboratory is certified under the Clinical Laboratory Improvement Amendments CLIA as qualified to perform high complexity clinical laboratory testing.    GENTAMICIN  Value in next row Sensitive      4 SENSITIVEThis is Darleth Eustache modified FDA-approved test that has been validated and its performance characteristics determined by the reporting laboratory.  This laboratory is certified under the Clinical Laboratory Improvement Amendments CLIA as qualified to perform high complexity clinical laboratory testing.    NITROFURANTOIN Value in next row Resistant      4 SENSITIVEThis is Killian Schwer modified FDA-approved test that has been validated and its performance characteristics determined by the reporting laboratory.  This laboratory is certified under the Clinical Laboratory Improvement  Amendments CLIA as qualified to perform high complexity clinical laboratory testing.    TRIMETH/SULFA Value in next row Sensitive      4 SENSITIVEThis is Matteson Blue modified FDA-approved test that has been validated and its performance characteristics determined by the reporting laboratory.  This laboratory is certified under the Clinical Laboratory Improvement Amendments CLIA as qualified to perform high complexity clinical laboratory testing.    AMPICILLIN/SULBACTAM Value in next row Sensitive      4 SENSITIVEThis is Audry Pecina modified FDA-approved test that has been validated and  its performance characteristics determined by the reporting laboratory.  This laboratory is certified under the Clinical Laboratory Improvement Amendments CLIA as qualified to perform high complexity clinical laboratory testing.    PIP/TAZO Value in next row Sensitive      <=4 SENSITIVEThis is Willona Phariss modified FDA-approved test that has been validated and its performance characteristics determined by the reporting laboratory.  This laboratory is certified under the Clinical Laboratory Improvement Amendments CLIA as qualified to perform high complexity clinical laboratory testing.    MEROPENEM Value in next row Sensitive      <=4 SENSITIVEThis is Sair Faulcon modified FDA-approved test that has been validated and its performance characteristics determined by the reporting laboratory.  This laboratory is certified under the Clinical Laboratory Improvement Amendments CLIA as qualified to perform high complexity clinical laboratory testing.    * 30,000 COLONIES/mL PROTEUS MIRABILIS  MRSA Next Gen by PCR, Nasal     Status: None   Collection Time: 10/09/24  8:44 PM   Specimen: Urine, Clean Catch; Nasal Swab  Result Value Ref Range Status   MRSA by PCR Next Gen NOT DETECTED NOT DETECTED Final    Comment: (NOTE) The GeneXpert MRSA Assay (FDA approved for NASAL specimens only), is one component of Jedediah Noda comprehensive MRSA colonization surveillance program. It is not  intended to diagnose MRSA infection nor to guide or monitor treatment for MRSA infections. Test performance is not FDA approved in patients less than 35 years old. Performed at [facility], [address], [place], [place] [address]          Radiology Studies: CARDIAC CATHETERIZATION Result Date: 10/11/2024   Dist LM lesion is 15% stenosed.   Prox Cx lesion is 40% stenosed.   Prox RCA to Mid RCA lesion is 30% stenosed.   Ost LAD to Prox LAD lesion is 30% stenosed. Patent proximal LAD stent with minimal restenosis Mild non-obstructive disease in the mid Circumflex at the ostium of the obtuse marginal branch Mild non-obstructive disease in the mid RCA LVEDP 8 mmHg Recommendations: Continue medical management of CAD   ECHOCARDIOGRAM COMPLETE Result Date: 10/10/2024    ECHOCARDIOGRAM REPORT   Patient Name:   Barbara [name] Date of Exam: 10/10/2024 Medical Rec #:  [id number]              Height:       68.0 in Accession #:    [id number]             Weight:       132.5 lb Date of Birth:  [birth year]              BSA:          1.716 m Patient Age:    83 years               BP:           132/48 mmHg Patient Gender: F                      HR:           60 bpm. Exam Location:  Inpatient Procedure: 2D Echo, Color Doppler, Cardiac Doppler and Intracardiac            Opacification Agent (Both Spectral and Color Flow Doppler were            utilized during procedure). Indications:    CHF Acute Diastolic I50.31  History:        Patient  has prior history of Echocardiogram examinations, most                 recent 05/18/2024.  Sonographer:    Tinnie Gosling RDCS Referring Phys: ROLLO JONELLE LOUDER IMPRESSIONS  1. Small spade like ventricular cavity. 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 Left ventricular ejection fraction, by estimation, is 60 to 65%. The left ventricle has normal  function. The left ventricle has no regional wall motion abnormalities. There is mild left ventricular hypertrophy. Left ventricular diastolic parameters are consistent with Grade II diastolic dysfunction (pseudonormalization). Elevated left ventricular end-diastolic pressure.  2. Device leads in RA/RV. Right ventricular systolic function is normal. The right ventricular size is normal.  3. Left atrial size was moderately dilated.  4. The mitral valve is abnormal. Mild mitral valve regurgitation. No evidence of mitral stenosis.  5. Calcified non coronary cusp. The aortic valve is tricuspid. There is moderate calcification of the aortic valve. There is moderate thickening of the aortic valve. Aortic valve regurgitation is not visualized. Aortic valve sclerosis is present, with no evidence of aortic valve stenosis.  6. The inferior vena cava is normal in size with greater than 50% respiratory variability, suggesting right atrial pressure of 3 mmHg. FINDINGS  Left Ventricle: Small spade like ventricular cavity. 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 Left ventricular ejection fraction, by  estimation, is 60 to 65%. The left ventricle has normal function. The left ventricle has no regional wall motion abnormalities. Strain was performed and the global longitudinal strain is indeterminate. The left ventricular internal cavity size was normal in size. There is mild left ventricular hypertrophy. Left ventricular diastolic parameters are consistent with Grade II diastolic dysfunction (pseudonormalization). Elevated left ventricular end-diastolic pressure. Right Ventricle: Device leads in RA/RV. The right ventricular size is normal. No increase in right ventricular wall thickness. Right ventricular systolic function is normal. Left Atrium: Left atrial size was moderately dilated. Right Atrium: Right  atrial size was normal in size. Pericardium: There is no evidence of pericardial effusion. Mitral Valve: The mitral valve is abnormal. There is mild thickening of the mitral valve leaflet(s). There is mild calcification of the mitral valve leaflet(s). Mild mitral valve regurgitation. No evidence of mitral valve stenosis. Tricuspid Valve: The tricuspid valve is normal in structure. Tricuspid valve regurgitation is mild . No evidence of tricuspid stenosis. Aortic Valve: Calcified non coronary cusp. The aortic valve is tricuspid. There is moderate calcification of the aortic valve. There is moderate thickening of the aortic valve. Aortic valve regurgitation is not visualized. Aortic valve sclerosis is present, with no evidence of aortic valve stenosis. Pulmonic Valve: The pulmonic valve was normal in structure. Pulmonic valve regurgitation is not visualized. No evidence of pulmonic stenosis. Aorta: The aortic root is normal in size and structure. Venous: The inferior vena cava is normal in size with greater than 50% respiratory variability, suggesting right atrial pressure of 3 mmHg. IAS/Shunts: No atrial level shunt detected by color flow Doppler. Additional Comments: 3D was performed not requiring image post processing on an independent workstation and was indeterminate.  LEFT VENTRICLE PLAX 2D LVIDd:         4.10 cm   Diastology LVIDs:         2.70 cm   LV e' medial:    4.68 cm/s LV PW:  1.20 cm   LV E/e' medial:  18.4 LV IVS:        1.20 cm   LV e' lateral:   6.42 cm/s LVOT diam:     1.60 cm   LV E/e' lateral: 13.4 LV SV:         50 LV SV Index:   29 LVOT Area:     2.01 cm LV IVRT:       113 msec  RIGHT VENTRICLE             IVC RV S prime:     12.40 cm/s  IVC diam: 1.10 cm LEFT ATRIUM             Index        RIGHT ATRIUM           Index LA diam:        4.30 cm 2.51 cm/m   RA Area:     13.10 cm LA Vol (A2C):   75.1 ml 43.77 ml/m  RA Volume:   31.80 ml  18.54 ml/m LA Vol (A4C):   86.3 ml 50.30 ml/m LA  Biplane Vol: 80.4 ml 46.86 ml/m  AORTIC VALVE LVOT Vmax:   138.00 cm/s LVOT Vmean:  94.500 cm/s LVOT VTI:    0.249 m  AORTA Ao Root diam: 2.30 cm Ao Asc diam:  2.80 cm MITRAL VALVE MV Area (PHT): 2.46 cm    SHUNTS MV Decel Time: 308 msec    Systemic VTI:  0.25 m MV E velocity: 85.90 cm/s  Systemic Diam: 1.60 cm MV Suriyah Vergara velocity: 44.50 cm/s MV E/Shyler Holzman ratio:  1.93 Maude Emmer MD Electronically signed by Maude Emmer MD Signature Date/Time: 10/10/2024/3:54:14 PM    Final         Scheduled Meds:  (feeding supplement) PROSource Plus  30 mL Oral BID BM   amiodarone   200 mg Oral Daily   apixaban   2.5 mg Oral BID   ascorbic acid   500 mg Oral BID   atorvastatin   40 mg Oral Daily   empagliflozin   10 mg Oral Daily   feeding supplement  237 mL Oral BID BM   metoprolol  tartrate  25 mg Oral BID   multivitamin with minerals  1 tablet Oral Daily   pantoprazole   40 mg Oral Daily   sodium chloride  flush  3 mL Intravenous Q12H   sodium chloride  flush  3 mL Intravenous Q12H   sucralfate   1 g Oral BID   thiamine   100 mg Oral Daily   zinc  sulfate (50mg  elemental zinc )  220 mg Oral Daily   Continuous Infusions:  sodium chloride  100 mL/hr at 10/12/24 9167   sodium chloride        LOS: 2 days    Time spent: over 30 min     Meliton Monte, MD [facility]   To contact the attending provider between 7A-7P or the covering provider during after hours 7P-7A, please log into the web site [email or web address] and access using universal [place] password for that web site. If you do not have the password, please call the hospital operator.  10/12/2024, 9:29 AM

## 2024-10-12 NOTE — Plan of Care (Signed)
  Problem: Education: Goal: Knowledge of General Education information will improve Description: Including pain rating scale, medication(s)/side effects and non-pharmacologic comfort measures Outcome: Progressing   Problem: Clinical Measurements: Goal: Ability to maintain clinical measurements within normal limits will improve Outcome: Progressing Goal: Respiratory complications will improve Outcome: Progressing Goal: Cardiovascular complication will be avoided Outcome: Progressing   Problem: Nutrition: Goal: Adequate nutrition will be maintained Outcome: Progressing   Problem: Pain Managment: Goal: General experience of comfort will improve and/or be controlled Outcome: Progressing

## 2024-10-12 NOTE — Progress Notes (Signed)
 Physical Therapy Treatment Patient Details Name: Barbara [name] MRN: [id number] DOB: 01-12-1941 Today's Date: 10/12/2024   History of Present Illness 83 yo female adm 12/6 with SOB and chest pain. Admitted for NSTEMI and acute CHF exacerbation. PMH includes: CAD, HFpEF, A-fib s/p PPM, essential HTN, HLD, anxiety, CKD, anemia, Rt TSA, vertigo, Lt femur fx    PT Comments  Pt asleep on entry, awakes easily and is agreeable to getting up with therapy but refuses to get up to recliner again as it is too uncomfortable. With removal of covers pt and and bed linens found to be saturated in urine. Pt requires minA for bed mobility and mod-minA for multiple sit to stand for cleaning and replacing dry gown and linens. Pt reports she is agreeable to discharge to SNF. PT will continue to follow acutely.     If plan is discharge home, recommend the following: A little help with walking and/or transfers;A little help with bathing/dressing/bathroom;Assistance with cooking/housework;Assist for transportation;Help with stairs or ramp for entrance   Can travel by private vehicle     Yes  Equipment Recommendations  None recommended by PT       Precautions / Restrictions Precautions Precautions: Fall Recall of Precautions/Restrictions: Intact Restrictions Weight Bearing Restrictions Per Provider Order: No     Mobility  Bed Mobility Overal bed mobility: Needs Assistance Bed Mobility: Supine to Sit, Sit to Supine     Supine to sit: Min assist, Used rails, HOB elevated     General bed mobility comments: minA to complete management of LE off bed,  trunk elevation and scooting hips to EoB    Transfers Overall transfer level: Needs assistance Equipment used: Rolling walker (2 wheels) Transfers: Sit to/from Stand Sit to Stand: Mod assist, From elevated surface, Min assist           General transfer comment: modA for initial stand from low bed surface, min A for 3x additional stands for  cleaning pt, and removal of wet mesh panties, as well as standing for linen change. due to [facility] malfunction.        Balance Overall balance assessment: Needs assistance Sitting-balance support: No upper extremity supported, Feet supported Sitting balance-Leahy Scale: Good     Standing balance support: Bilateral upper extremity supported, During functional activity Standing balance-Leahy Scale: Poor Standing balance comment: dependent on BUE support                            Communication Communication Communication: No apparent difficulties  Cognition Arousal: Alert Behavior During Therapy: WFL for tasks assessed/performed   PT - Cognitive impairments: No family/caregiver present to determine baseline, Initiation, Sequencing, Problem solving, Safety/Judgement                       PT - Cognition Comments: pt needing cues for mobility and transfer technique, Following commands: Impaired Following commands impaired: Follows one step commands with increased time    Cueing Cueing Techniques: Verbal cues     General Comments General comments (skin integrity, edema, etc.): VSS      Pertinent Vitals/Pain Pain Assessment Pain Assessment: No/denies pain     PT Goals (current goals can now be found in the care plan section) Acute Rehab PT Goals Patient Stated Goal: to improve strength and go home PT Goal Formulation: With patient Time For Goal Achievement: 10/24/24 Potential to Achieve Goals: Good Progress towards PT goals: Progressing toward goals  Frequency    Min 2X/week       AM-PAC PT 6 Clicks Mobility   Outcome Measure  Help needed turning from your back to your side while in a flat bed without using bedrails?: A Little Help needed moving from lying on your back to sitting on the side of a flat bed without using bedrails?: A Little Help needed moving to and from a bed to a chair (including a wheelchair)?: A Lot Help needed  standing up from a chair using your arms (e.g., wheelchair or bedside chair)?: A Lot Help needed to walk in hospital room?: A Lot Help needed climbing 3-5 steps with a railing? : Total 6 Click Score: 13    End of Session Equipment Utilized During Treatment: Gait belt Activity Tolerance: Patient tolerated treatment well Patient left: with call bell/phone within reach;with nursing/sitter in room (NT getting pt washed down before return to bed.) Nurse Communication: Mobility status;Other (comment) (pill found in bed) PT Visit Diagnosis: Unsteadiness on feet (R26.81);Other abnormalities of gait and mobility (R26.89);Repeated falls (R29.6);Muscle weakness (generalized) (M62.81)     Time: 8387-8354 PT Time Calculation (min) (ACUTE ONLY): 33 min  Charges:    $Therapeutic Activity: 8-22 mins $Self Care/Home Management: 8-22 PT General Charges $$ ACUTE PT VISIT: 1 Visit                     Feliz Lincoln B. Fleeta Lapidus PT, DPT Acute Rehabilitation Services Please use secure chat or  Call Office [phone]    Almarie KATHEE Fleeta [facility] 10/12/2024, 6:47 PM

## 2024-10-12 NOTE — TOC Progression Note (Signed)
 Transition of Care [facility]) - Progression Note    Patient Details  Name: Barbara [name] MRN: [id number] Date of Birth: August 17, 1941  Transition of Care [facility]) CM/SW Contact  Lauraine FORBES Saa, LCSWA Phone Number: 10/12/2024, 1:47 PM  Clinical Narrative:     1:48 PM CSW provided patient with current SNF options and quarterly Medicare SNF ratings ([facility], [facility], [address], [facility], [facility], [place], [place], [place], [place]). Patient expressed preference in [facility] and stated second choice would be [facility]. CSW relayed preference to [facility] and patient's son, Sallee (patient consented CSW to speak with Jerod; per MD, Jerod requested to be part of SNF decision making process). Jerod discussed SNF options with patient after they were provided and informed CSW that patient changed mind and expressed preference in [facility]. [facility] and medical team made of decision and new PT note needed for SNF insurance authorization. CSW will submit SNF insurance authorization request upon new PT note. CSW will continue to follow.  Expected Discharge Plan: Skilled Nursing Facility Barriers to Discharge: Continued Medical Work up, English As A Second Language Teacher, SNF Pending bed offer               Expected Discharge Plan and Services In-house Referral: Clinical Social Work   Post Acute Care Choice: Skilled Nursing Facility Living arrangements for the past 2 months: Single Family Home                                       Social Drivers of Health (SDOH) Interventions SDOH Screenings   Food Insecurity: No Food Insecurity (10/09/2024)  Housing: High Risk (06/20/2024)  Transportation Needs: Unmet Transportation Needs (06/20/2024)  Utilities: Not At Risk (10/09/2024)  Alcohol Screen: Low Risk  (08/05/2022)  Depression (PHQ2-9): Low Risk  (04/05/2024)  Financial Resource Strain: Low Risk  (08/05/2022)  Physical  Activity: Insufficiently Active (07/02/2022)  Social Connections: Moderately Integrated (06/20/2024)  Stress: No Stress Concern Present (07/02/2022)  Tobacco Use: Medium Risk (10/09/2024)    Readmission Risk Interventions    05/24/2024    8:57 AM 03/11/2024    1:07 PM  Readmission Risk Prevention Plan  Transportation Screening Complete Complete  PCP or Specialist Appt within 5-7 Days  Complete  Home Care Screening  Complete  Medication Review (RN CM)  Complete  HRI or Home Care Consult Complete   Social Work Consult for Recovery Care Planning/Counseling Complete   Palliative Care Screening Not Applicable   Medication Review Oceanographer) Complete

## 2024-10-12 NOTE — Progress Notes (Signed)
 Mobility Specialist: Progress Note   10/12/24 1200  Mobility  Activity Ambulated with assistance  Level of Assistance Minimal assist, patient does 75% or more  Assistive Device Front wheel walker  Distance Ambulated (ft) 12 ft  Activity Response Tolerated well  Mobility Referral Yes  Mobility visit 1 Mobility  Mobility Specialist Start Time (ACUTE ONLY) 1015  Mobility Specialist Stop Time (ACUTE ONLY) 1039  Mobility Specialist Time Calculation (min) (ACUTE ONLY) 24 min    Pt received in bed, agreeable to mobility session. MinA for bed mobility to assist scooting EOB. ModA for STS from low bed height. MinA for ambulation to steady. C/o L knee pain and weakness. 3x occurrence of L knee buckling requiring minA to correct. Ambulated around the bed to the recliner. Agreed to sit up for a little while. Left in chair with all needs met, call bell in reach.   Ileana Lute Mobility Specialist Please contact via SecureChat or Rehab office at [phone]

## 2024-10-12 NOTE — Plan of Care (Signed)
  Problem: Education: Goal: Knowledge of General Education information will improve Description: Including pain rating scale, medication(s)/side effects and non-pharmacologic comfort measures Outcome: Progressing   Problem: Clinical Measurements: Goal: Cardiovascular complication will be avoided Outcome: Progressing   Problem: Activity: Goal: Risk for activity intolerance will decrease Outcome: Progressing   Problem: Pain Managment: Goal: General experience of comfort will improve and/or be controlled Outcome: Progressing   Problem: Skin Integrity: Goal: Risk for impaired skin integrity will decrease Outcome: Progressing

## 2024-10-12 NOTE — Progress Notes (Signed)
 Progress Note  Patient Name: Barbara [name] Date of Encounter: 10/12/2024  [facility] Cardiologist: Annabella Scarce, MD   Patient Profile     Subjective   BP 135/51.  Creatinine stable at 1.34. Reports dyspnea and chest pain  Inpatient Medications    Scheduled Meds:  (feeding supplement) PROSource Plus  30 mL Oral BID BM   amiodarone   200 mg Oral Daily   apixaban   2.5 mg Oral BID   ascorbic acid   500 mg Oral BID   aspirin  EC  81 mg Oral Daily   atorvastatin   40 mg Oral Daily   empagliflozin   10 mg Oral Daily   feeding supplement  237 mL Oral BID BM   metoprolol  tartrate  25 mg Oral BID   multivitamin with minerals  1 tablet Oral Daily   pantoprazole   40 mg Oral Daily   sodium chloride  flush  3 mL Intravenous Q12H   sodium chloride  flush  3 mL Intravenous Q12H   sucralfate   1 g Oral BID   thiamine   100 mg Oral Daily   zinc  sulfate (50mg  elemental zinc )  220 mg Oral Daily   Continuous Infusions:  sodium chloride  100 mL/hr at 10/12/24 9167   sodium chloride      PRN Meds: sodium chloride , acetaminophen  **OR** acetaminophen , albuterol , alum & mag hydroxide-simeth, loratadine , ondansetron  **OR** ondansetron  (ZOFRAN ) IV, sodium chloride  flush, sodium chloride  flush   Vital Signs    Vitals:   10/11/24 2301 10/12/24 0330 10/12/24 0729 10/12/24 0833  BP: 118/61   (!) 135/51  Pulse: 60   60  Resp: (!) 27 17 18    Temp: (!) 97.4 F (36.3 C)  98.3 F (36.8 C)   TempSrc: Oral Oral Oral   SpO2: 95%     Weight:  63.7 kg    Height:        Intake/Output Summary (Last 24 hours) at 10/12/2024 0854 Last data filed at 10/12/2024 0733 Gross per 24 hour  Intake 500 ml  Output 620 ml  Net -120 ml      10/12/2024    3:30 AM 10/11/2024    6:26 AM 10/10/2024    3:34 AM  Last 3 Weights  Weight (lbs) 140 lb 6.9 oz 132 lb 15 oz 132 lb 7.9 oz  Weight (kg) 63.7 kg 60.3 kg 60.1 kg      Telemetry    AV paced rhythm- Personally Reviewed  ECG    No new EKG to  review- Personally Reviewed  Physical Exam   GEN: No acute distress.   Neck: No JVD Cardiac: RRR, no murmurs, rubs, or gallops.  Respiratory: crackles at bases bilaterally GI: Soft, nontender, non-distended  MS: trace pedal  edema; No deformity. Neuro:  Nonfocal  Psych: Normal affect   Labs    High Sensitivity Troponin:   Recent Labs  Lab 10/09/24 1702 10/09/24 1832 10/09/24 2010 10/09/24 2135 10/10/24 0332  TROPONINIHS 71* 85* 60* 80* 78*      Chemistry Recent Labs  Lab 10/05/24 1546 10/09/24 1421 10/10/24 0957 10/10/24 1349 10/11/24 0319 10/12/24 0214  NA 140   < > 145 137 138 139  K 3.8   < > 4.3 4.1 3.4* 4.2  CL 107   < > 111 105 100 108  CO2 23   < > 16* 24 25 23   GLUCOSE 79   < > 112* 96 78 90  BUN 24*   < > 18 22 26* 26*  CREATININE 1.22*   < >  1.38* 1.31* 1.38* 1.34*  CALCIUM  10.3   < > 10.2 9.5 9.8 9.3  PROT 6.7  --  6.5  --   --   --   ALBUMIN 3.3*  --  2.6*  --  2.4* 2.2*  AST 30  --  44*  --   --   --   ALT 18  --  11  --   --   --   ALKPHOS 108  --  109  --   --   --   BILITOT 1.2  --  QUANTITY NOT SUFFICIENT, UNABLE TO PERFORM TEST  --   --   --   GFRNONAA  --    < > 38* 40* 38* 39*  ANIONGAP  --    < > 18* 8 13 8    < > = values in this interval not displayed.     Hematology Recent Labs  Lab 10/10/24 0957 10/11/24 0319 10/12/24 0214  WBC 4.1 3.8* 3.8*  RBC 5.06 4.70 4.59  HGB 14.2 13.7 13.1  HCT 44.9 41.1 41.2  MCV 88.7 87.4 89.8  MCH 28.1 29.1 28.5  MCHC 31.6 33.3 31.8  RDW 18.7* 18.3* 18.5*  PLT 216 231 257    BNPNo results for input(s): BNP, PROBNP in the last 168 hours.   DDimer No results for input(s): DDIMER in the last 168 hours.   CHA2DS2-VASc Score = 5   This indicates a 7.2% annual risk of stroke. The patient's score is based upon: CHF History: 0 HTN History: 1 Diabetes History: 0 Stroke History: 0 Vascular Disease History: 1 Age Score: 2 Gender Score: 1      Radiology    CARDIAC  CATHETERIZATION Result Date: 10/11/2024   Dist LM lesion is 15% stenosed.   Prox Cx lesion is 40% stenosed.   Prox RCA to Mid RCA lesion is 30% stenosed.   Ost LAD to Prox LAD lesion is 30% stenosed. Patent proximal LAD stent with minimal restenosis Mild non-obstructive disease in the mid Circumflex at the ostium of the obtuse marginal branch Mild non-obstructive disease in the mid RCA LVEDP 8 mmHg Recommendations: Continue medical management of CAD   ECHOCARDIOGRAM COMPLETE Result Date: 10/10/2024    ECHOCARDIOGRAM REPORT   Patient Name:   Barbara [name] Date of Exam: 10/10/2024 Medical Rec #:  [id number]              Height:       68.0 in Accession #:    [id number]             Weight:       132.5 lb Date of Birth:  Jan 16, 1941              BSA:          1.716 m Patient Age:    83 years               BP:           132/48 mmHg Patient Gender: F                      HR:           60 bpm. Exam Location:  Inpatient Procedure: 2D Echo, Color Doppler, Cardiac Doppler and Intracardiac            Opacification Agent (Both Spectral and Color Flow Doppler were            utilized during procedure). Indications:  CHF Acute Diastolic I50.31  History:        Patient has prior history of Echocardiogram examinations, most                 recent 05/18/2024.  Sonographer:    Tinnie Gosling RDCS Referring Phys: ROLLO JONELLE LOUDER IMPRESSIONS  1. Small spade like ventricular cavity. 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 Left ventricular ejection fraction, by estimation, is 60 to 65%. The left ventricle has normal function. The left ventricle has no regional wall motion abnormalities. There is mild left ventricular hypertrophy. Left ventricular diastolic parameters are consistent with Grade II diastolic dysfunction (pseudonormalization). Elevated left ventricular end-diastolic pressure.  2. Device leads  in RA/RV. Right ventricular systolic function is normal. The right ventricular size is normal.  3. Left atrial size was moderately dilated.  4. The mitral valve is abnormal. Mild mitral valve regurgitation. No evidence of mitral stenosis.  5. Calcified non coronary cusp. The aortic valve is tricuspid. There is moderate calcification of the aortic valve. There is moderate thickening of the aortic valve. Aortic valve regurgitation is not visualized. Aortic valve sclerosis is present, with no evidence of aortic valve stenosis.  6. The inferior vena cava is normal in size with greater than 50% respiratory variability, suggesting right atrial pressure of 3 mmHg. FINDINGS  Left Ventricle: Small spade like ventricular cavity. 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 Left ventricular ejection fraction, by  estimation, is 60 to 65%. The left ventricle has normal function. The left ventricle has no regional wall motion abnormalities. Strain was performed and the global longitudinal strain is indeterminate. The left ventricular internal cavity size was normal in size. There is mild left ventricular hypertrophy. Left ventricular diastolic parameters are consistent with Grade II diastolic dysfunction (pseudonormalization). Elevated left ventricular end-diastolic pressure. Right Ventricle: Device leads in RA/RV. The right ventricular size is normal. No increase in right ventricular wall thickness. Right ventricular systolic function is normal. Left Atrium: Left atrial size was moderately dilated. Right Atrium: Right atrial size was normal in size. Pericardium: There is no evidence of pericardial effusion. Mitral Valve: The mitral valve is abnormal. There is mild thickening of the mitral valve leaflet(s). There is mild calcification of the mitral valve leaflet(s). Mild mitral valve regurgitation. No evidence of  mitral valve stenosis. Tricuspid Valve: The tricuspid valve is normal in structure. Tricuspid valve regurgitation is mild . No evidence of tricuspid stenosis. Aortic Valve: Calcified non coronary cusp. The aortic valve is tricuspid. There is moderate calcification of the aortic valve. There is moderate thickening of the aortic valve. Aortic valve regurgitation is not visualized. Aortic valve sclerosis is present, with no evidence of aortic valve stenosis. Pulmonic Valve: The pulmonic valve was normal in structure. Pulmonic valve regurgitation is not visualized. No evidence of pulmonic stenosis. Aorta: The aortic root is normal in size and structure. Venous: The inferior vena cava is normal in size with greater than 50% respiratory variability, suggesting right atrial pressure of 3 mmHg. IAS/Shunts: No atrial level shunt detected by color flow Doppler. Additional Comments: 3D was performed not requiring image post processing on an independent workstation and was indeterminate.  LEFT VENTRICLE PLAX 2D LVIDd:         4.10 cm   Diastology LVIDs:         2.70 cm  LV e' medial:    4.68 cm/s LV PW:         1.20 cm   LV E/e' medial:  18.4 LV IVS:        1.20 cm   LV e' lateral:   6.42 cm/s LVOT diam:     1.60 cm   LV E/e' lateral: 13.4 LV SV:         50 LV SV Index:   29 LVOT Area:     2.01 cm LV IVRT:       113 msec  RIGHT VENTRICLE             IVC RV S prime:     12.40 cm/s  IVC diam: 1.10 cm LEFT ATRIUM             Index        RIGHT ATRIUM           Index LA diam:        4.30 cm 2.51 cm/m   RA Area:     13.10 cm LA Vol (A2C):   75.1 ml 43.77 ml/m  RA Volume:   31.80 ml  18.54 ml/m LA Vol (A4C):   86.3 ml 50.30 ml/m LA Biplane Vol: 80.4 ml 46.86 ml/m  AORTIC VALVE LVOT Vmax:   138.00 cm/s LVOT Vmean:  94.500 cm/s LVOT VTI:    0.249 m  AORTA Ao Root diam: 2.30 cm Ao Asc diam:  2.80 cm MITRAL VALVE MV Area (PHT): 2.46 cm    SHUNTS MV Decel Time: 308 msec    Systemic VTI:  0.25 m MV E velocity: 85.90 cm/s  Systemic  Diam: 1.60 cm MV A velocity: 44.50 cm/s MV E/A ratio:  1.93 Maude Emmer MD Electronically signed by Maude Emmer MD Signature Date/Time: 10/10/2024/3:54:14 PM    Final     Patient Profile     83 y.o. female with a hx of CAD, apical hokum, OSA, sick sinus syndrome s/p PPM, atrial fibrillation, CKD, hypertension, hyperlipidemia who is being seen 10/10/2024 for the evaluation of NSTEMI at the request of Dr. Dean.   Assessment & Plan    83 y.o. female with a hx of CAD, apical HCM, OSA, SSS status post PPM, atrial fibrillation, CKD hypertension, hyperlipidemia who presents for follow-up   CAD: History of PCI to LAD with BMS in 2000.  Most recent cath 05/2022 showed patent LAD stent with 30% in-stent restenosis and mild nonobstructive CAD.  Presented with chest pain and mild troponin elevation, peak 105.  Echo with EF 60 to 65%.  Had admission in August with chest pain and A-fib, troponin up to 408.  LHC on 12/8 showed nonobstructive CAD (15% distal left main, 40% proximal LCx, 30% proximal to mid RCA, 30% ostial to proximal LAD), LVEDP 8 - Continue Lopressor  25 mg twice daily -Continue Eliquis  2.5 mg twice daily - Previously on rosuvastatin  but appears was discontinued due to myalgias.  Will trial atorvastatin .  If having myalgias on atorvastatin  will need referral to pharmacy lipid clinic for PCSK9 inhibitor   Acute on chronic diastolic heart failure: Echo with EF 60 to 65%, apical hypertrophic cardiomyopathy, G2 DD, mild mitral regurgitation, moderate left atrial enlargement, RAP 3. -She has been diuresed with IV Lasix .  Appears euvolemic.  Continue Jardiance .  Will discharge on as needed Lasix .  Recommend monitoring daily weights and can take Lasix  40 mg if gains more than 3 pounds in 1 day or 5 pounds in 1 week  Apical HCM: CMR 05/2020 showed apical hypertrophy measuring up to 15 mm, consistent with apical HCM, patchy LGE at apex and RV insertion site (2% of total myocardial mass), normal  biventricular size and hyperdynamic systolic function.   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  She has been on on Amio 200 mg daily and Eliquis  2.5 mg twice daily.  Maintaining sinus rhythm   CKD: Creatinine stable at 1.3   Hypertension: On metoprolol  25 mg twice daily   Hyperlipidemia: Restart statin as above  [facility] will sign off.   Medication Recommendations: Amiodarone  200 mg daily, Eliquis  2.5 mg twice daily, Jardiance  10 mg daily, metoprolol  25 mg twice daily, atorvastatin  40 mg daily.  Recommend monitoring daily weights and can take Lasix  40 mg if gains more than 3 pounds in 1 day or 5 pounds in 1 week Other recommendations (labs, testing, etc): None Follow up as an outpatient: Will schedule   For questions or updates, please contact [facility] Please consult [email or web address] for contact info under        Signed, Lonni LITTIE Nanas, MD  10/12/2024, 8:54 AM

## 2024-10-13 ENCOUNTER — Telehealth: Payer: Self-pay

## 2024-10-13 ENCOUNTER — Inpatient Hospital Stay (HOSPITAL_COMMUNITY)

## 2024-10-13 DIAGNOSIS — Z96611 Presence of right artificial shoulder joint: Secondary | ICD-10-CM | POA: Diagnosis not present

## 2024-10-13 DIAGNOSIS — R918 Other nonspecific abnormal finding of lung field: Secondary | ICD-10-CM | POA: Diagnosis not present

## 2024-10-13 DIAGNOSIS — R0989 Other specified symptoms and signs involving the circulatory and respiratory systems: Secondary | ICD-10-CM | POA: Diagnosis not present

## 2024-10-13 DIAGNOSIS — Z950 Presence of cardiac pacemaker: Secondary | ICD-10-CM | POA: Diagnosis not present

## 2024-10-13 DIAGNOSIS — I200 Unstable angina: Secondary | ICD-10-CM | POA: Diagnosis not present

## 2024-10-13 LAB — RENAL FUNCTION PANEL
Albumin: 2.4 g/dL — ABNORMAL LOW (ref 3.5–5.0)
Anion gap: 7 (ref 5–15)
BUN: 21 mg/dL (ref 8–23)
CO2: 23 mmol/L (ref 22–32)
Calcium: 10.1 mg/dL (ref 8.9–10.3)
Chloride: 108 mmol/L (ref 98–111)
Creatinine, Ser: 1.35 mg/dL — ABNORMAL HIGH (ref 0.44–1.00)
GFR, Estimated: 39 mL/min — ABNORMAL LOW (ref >60)
Glucose, Bld: 86 mg/dL (ref 70–99)
Phosphorus: 2 mg/dL — ABNORMAL LOW (ref 2.5–4.6)
Potassium: 3.9 mmol/L (ref 3.5–5.1)
Sodium: 138 mmol/L (ref 135–145)

## 2024-10-13 LAB — CBC
HCT: 41.1 % (ref 36.0–46.0)
Hemoglobin: 13.2 g/dL (ref 12.0–15.0)
MCH: 28.3 pg (ref 26.0–34.0)
MCHC: 32.1 g/dL (ref 30.0–36.0)
MCV: 88 fL (ref 80.0–100.0)
Platelets: 279 K/uL (ref 150–400)
RBC: 4.67 MIL/uL (ref 3.87–5.11)
RDW: 17.9 % — ABNORMAL HIGH (ref 11.5–15.5)
WBC: 4.4 K/uL (ref 4.0–10.5)
nRBC: 0 % (ref 0.0–0.2)

## 2024-10-13 LAB — BRAIN NATRIURETIC PEPTIDE: B Natriuretic Peptide: 960 pg/mL — ABNORMAL HIGH (ref 0.0–100.0)

## 2024-10-13 LAB — MAGNESIUM: Magnesium: 1.7 mg/dL (ref 1.7–2.4)

## 2024-10-13 LAB — PROCALCITONIN: Procalcitonin: 0.55 ng/mL

## 2024-10-13 MED ORDER — FUROSEMIDE 40 MG PO TABS
40.0000 mg | ORAL_TABLET | Freq: Every day | ORAL | Status: DC
  Administered 2024-10-14 – 2024-10-27 (×14): 40 mg via ORAL
  Filled 2024-10-13 (×14): qty 1

## 2024-10-13 MED ORDER — ISOSORBIDE MONONITRATE ER 30 MG PO TB24
30.0000 mg | ORAL_TABLET | Freq: Every day | ORAL | Status: DC
  Administered 2024-10-13 – 2024-11-06 (×25): 30 mg via ORAL
  Filled 2024-10-13 (×19): qty 1

## 2024-10-13 MED ORDER — SENNOSIDES-DOCUSATE SODIUM 8.6-50 MG PO TABS
1.0000 | ORAL_TABLET | Freq: Every day | ORAL | Status: DC
  Administered 2024-10-13 – 2024-11-03 (×17): 1 via ORAL
  Filled 2024-10-13 (×17): qty 1

## 2024-10-13 MED ORDER — FUROSEMIDE 10 MG/ML IJ SOLN
40.0000 mg | Freq: Once | INTRAMUSCULAR | Status: AC
  Administered 2024-10-13: 40 mg via INTRAVENOUS
  Filled 2024-10-13: qty 4

## 2024-10-13 MED ORDER — POLYETHYLENE GLYCOL 3350 17 G PO PACK
17.0000 g | PACK | Freq: Two times a day (BID) | ORAL | Status: DC
  Administered 2024-10-13 – 2024-10-18 (×9): 17 g via ORAL
  Filled 2024-10-13 (×13): qty 1

## 2024-10-13 NOTE — Progress Notes (Signed)
 Occupational Therapy Treatment Patient Details Name: Barbara [name] MRN: [id number] DOB: 28-Aug-1941 Today's Date: 10/13/2024   History of present illness 83 yo female adm 12/6 with SOB and chest pain. Admitted for NSTEMI and acute CHF exacerbation. PMH includes: CAD, HFpEF, A-fib s/p PPM, essential HTN, HLD, anxiety, CKD, anemia, Rt TSA, vertigo, Lt femur fx   OT comments  Pt progressing toward goals, needs min A for bed mobility and min A for standing/transfers this session with use of RW. Pt able to stand at sink x10 min for grooming ADL, noted BLE fatiguing and pt states it feels as though her knees are giving out/buckling. Pt presenting with impairments listed below, will follow acutely. Patient will benefit from continued inpatient follow up therapy, <3 hours/day to maximize safety/ind with ADL/functional mobility.       If plan is discharge home, recommend the following:  A little help with walking and/or transfers;A lot of help with bathing/dressing/bathroom;Assistance with cooking/housework;Direct supervision/assist for medications management;Direct supervision/assist for financial management;Assist for transportation;Help with stairs or ramp for entrance   Equipment Recommendations  Other (comment) (defer)    Recommendations for Other Services PT consult    Precautions / Restrictions Precautions Precautions: Fall Recall of Precautions/Restrictions: Intact Restrictions Weight Bearing Restrictions Per Provider Order: No       Mobility Bed Mobility Overal bed mobility: Needs Assistance Bed Mobility: Supine to Sit, Sit to Supine     Supine to sit: Min assist Sit to supine: Min assist   General bed mobility comments: min A to assist BLEs back in to bed, cues to scoot forward to EOB in prep for standing    Transfers Overall transfer level: Needs assistance Equipment used: Rolling walker (2 wheels) Transfers: Sit to/from Stand Sit to Stand: Min assist                  Balance Overall balance assessment: Needs assistance Sitting-balance support: No upper extremity supported, Feet supported Sitting balance-Leahy Scale: Good     Standing balance support: Bilateral upper extremity supported, During functional activity Standing balance-Leahy Scale: Fair Standing balance comment: static standing without AD                           ADL either performed or assessed with clinical judgement   ADL Overall ADL's : Needs assistance/impaired Eating/Feeding: Set up;Sitting   Grooming: Set up;Oral care;Wash/dry face;Standing           Upper Body Dressing : Contact guard assist;Sitting       Toilet Transfer: Minimal assistance;Ambulation;Rolling walker (2 wheels)           Functional mobility during ADLs: Minimal assistance;Rolling walker (2 wheels)      Extremity/Trunk Assessment Upper Extremity Assessment Upper Extremity Assessment: Generalized weakness   Lower Extremity Assessment Lower Extremity Assessment: Defer to PT evaluation        Vision   Vision Assessment?: No apparent visual deficits   Perception Perception Perception: Not tested   Praxis Praxis Praxis: Not tested   Communication Communication Communication: No apparent difficulties   Cognition Arousal: Alert Behavior During Therapy: WFL for tasks assessed/performed Cognition: No apparent impairments                               Following commands: Impaired Following commands impaired: Follows one step commands with increased time      Cueing   Cueing Techniques: Verbal  cues  Exercises      Shoulder Instructions       General Comments VSS on RA    Pertinent Vitals/ Pain       Pain Assessment Pain Assessment: No/denies pain  Home Living                                          Prior Functioning/Environment              Frequency  Min 2X/week        Progress Toward Goals  OT  Goals(current goals can now be found in the care plan section)  Progress towards OT goals: Progressing toward goals  Acute Rehab OT Goals Patient Stated Goal: none stated OT Goal Formulation: With patient Time For Goal Achievement: 10/25/24 Potential to Achieve Goals: Good ADL Goals Pt Will Perform Upper Body Dressing: with modified independence;sitting Pt Will Perform Lower Body Dressing: with min assist;sitting/lateral leans;sit to/from stand Pt Will Transfer to Toilet: with supervision;ambulating;regular height toilet Pt Will Perform Tub/Shower Transfer: Tub transfer;Shower transfer;with supervision;ambulating  Plan      Co-evaluation                 AM-PAC OT 6 Clicks Daily Activity     Outcome Measure   Help from another person eating meals?: A Little Help from another person taking care of personal grooming?: A Little Help from another person toileting, which includes using toliet, bedpan, or urinal?: A Little Help from another person bathing (including washing, rinsing, drying)?: A Lot Help from another person to put on and taking off regular upper body clothing?: A Little Help from another person to put on and taking off regular lower body clothing?: A Lot 6 Click Score: 16    End of Session Equipment Utilized During Treatment: Rolling walker (2 wheels);Gait belt  OT Visit Diagnosis: Unsteadiness on feet (R26.81);Other abnormalities of gait and mobility (R26.89);Muscle weakness (generalized) (M62.81);History of falling (Z91.81)   Activity Tolerance Patient tolerated treatment well   Patient Left in bed;with call bell/phone within reach;with bed alarm set   Nurse Communication Mobility status (neeeds assist to order lunch)        Time: 8891-8870 OT Time Calculation (min): 21 min  Charges: OT General Charges $OT Visit: 1 Visit OT Treatments $Self Care/Home Management : 8-22 mins  Barbara [name], OTD, OTR/L SecureChat Preferred Acute Rehab [phone]   Barbara [name] 10/13/2024, 11:53 AM

## 2024-10-13 NOTE — Progress Notes (Addendum)
 PROGRESS NOTE    Barbara [name]  FMW:[id number] DOB: 1941/10/30 DOA: 10/09/2024 PCP: Johnny Garnette LABOR, MD  Chief Complaint  Patient presents with   Chest Pain   Shortness of Breath    Brief Narrative:   83 y.o. female with medical history significant of  CAD, Tilmon Wisehart.fib on eliquis , HTN, HLD, GERD, CKD3a, SSS sp pacemaker here with chest pain, being treated for NSTEMI.   Assessment & Plan:   Principal Problem:   Unstable angina (HCC) Active Problems:   NSTEMI (non-ST elevated myocardial infarction) (HCC)   Normocytic anemia   CAD (coronary artery disease/elevated troponin   PAF (paroxysmal atrial fibrillation) (HCC)   Primary hypertension   Chronic kidney disease, stage 3b (HCC)   Hyperlipidemia   Gastroesophageal reflux disease   Tachycardia-bradycardia syndrome (HCC)   Failure to thrive in adult   Acute on chronic diastolic heart failure (HCC)   Hypokalemia   Moderate protein malnutrition   Night sweats  Crackles Patchy Right Upper and Left Lower Lobe Opacities IVF stopped (had received IVF since cath).  Afebrile, no leukocytosis - will monitor for now off abx - diuresing for HF as below  Chest Pain NSTEMI  Coronary Artery Disease Troponin peaked to 105, downtrending  Appreciate cardiology recommendations Echo without WMA S/p LHC with patent proximal LAD stent with minimal restenosis, mild nonobstructive disease in the mid circumflex at the ostium of the obtuse marginal branch, mild non obstructive disease in the mid RCA - recommending medical management Aspirin , lipitor, metoprolol    Acute on Chronic Diastolic HF Apical Hypertrophic Cardiomyopathy Echo with small spade like ventricular cavity, apical cavity obliteration in systole, morphologic findings consistent with apical hypertrophic cardiomyopathy only noted with definity . Grade II diastolic dysfunction.  Preserved EF.   Per cards   Concerns for crackles overnight, s/p IV lasix  x1, will resume daily lasix   PO while admitted Appears euvolemic - cards recommending prn lasix  at home, jardiance  (sign off 12/9)  Anion Gap Metabolic Acidosis resolved  Hypertension Metoprolol   Atrial Fibrillation Eliquis   Metoprolol , amiodarone   CKD IIIb Creatinine appears close to baseline  GERD PPI  Tachybrady S/p pacemaker placement  Deconditioning Follow PT/OT - lives alone     DVT prophylaxis: eliquis  Code Status: full Family Communication: son over phone 12/9 (she was on phone with him) Disposition:   Status is: Observation The patient remains OBS appropriate and will d/c before 2 midnights.   Consultants:  cardiology  Procedures:  none  Antimicrobials:  Anti-infectives (From admission, onward)    None       Subjective: C/o abdominal discomfort No SOB currently   Objective: Vitals:   10/13/24 0400 10/13/24 0408 10/13/24 0500 10/13/24 0744  BP: (!) 186/67  (!) 168/84 (!) 151/64  Pulse: 61  (!) 59 61  Resp: (!) 24  (!) 31   Temp:  98.2 F (36.8 C)  98 F (36.7 C)  TempSrc:  Axillary  Oral  SpO2: 97%  95%   Weight:      Height:        Intake/Output Summary (Last 24 hours) at 10/13/2024 0848 Last data filed at 10/13/2024 0744 Gross per 24 hour  Intake 240 ml  Output 3675 ml  Net -3435 ml   Filed Weights   10/11/24 0626 10/12/24 0330 10/13/24 0300  Weight: 60.3 kg 63.7 kg 61.6 kg    Examination:  General: No acute distress. Cardiovascular: RRR Lungs: no clearly adventitious lung sounds on my exam, no increased WOB - on RA Abdomen: Soft,  nontender, nondistended  Neurological: Alert and oriented 3. Moves all extremities 4 with equal strength. Cranial nerves II through XII grossly intact. Extremities: No clubbing or cyanosis. No edema.   Data Reviewed: I have personally reviewed following labs and imaging studies  CBC: Recent Labs  Lab 10/09/24 1421 10/10/24 0957 10/11/24 0319 10/12/24 0214 10/13/24 0404  WBC 4.7 4.1 3.8* 3.8* 4.4  HGB 13.7  14.2 13.7 13.1 13.2  HCT 42.9 44.9 41.1 41.2 41.1  MCV 88.1 88.7 87.4 89.8 88.0  PLT 228 216 231 257 279    Basic Metabolic Panel: Recent Labs  Lab 10/09/24 2010 10/10/24 0957 10/10/24 1349 10/11/24 0319 10/12/24 0214 10/13/24 0404  NA  --  145 137 138 139 138  K  --  4.3 4.1 3.4* 4.2 3.9  CL  --  111 105 100 108 108  CO2  --  16* 24 25 23 23   GLUCOSE  --  112* 96 78 90 86  BUN  --  18 22 26* 26* 21  CREATININE  --  1.38* 1.31* 1.38* 1.34* 1.35*  CALCIUM   --  10.2 9.5 9.8 9.3 10.1  MG 1.2* 2.3  QUANTITY NOT SUFFICIENT, UNABLE TO PERFORM TEST 1.8 1.8 1.8 1.7  PHOS 1.4* 3.5  --  3.1 2.5 2.0*    GFR: Estimated Creatinine Clearance: 29.6 mL/min (Jaree Dwight) (by C-G formula based on SCr of 1.35 mg/dL (H)).  Liver Function Tests: Recent Labs  Lab 10/10/24 0957 10/11/24 0319 10/12/24 0214 10/13/24 0404  AST 44*  --   --   --   ALT 11  --   --   --   ALKPHOS 109  --   --   --   BILITOT QUANTITY NOT SUFFICIENT, UNABLE TO PERFORM TEST  --   --   --   PROT 6.5  --   --   --   ALBUMIN 2.6* 2.4* 2.2* 2.4*    CBG: No results for input(s): GLUCAP in the last 168 hours.   Recent Results (from the past 240 hours)  Resp panel by RT-PCR (RSV, Flu Osker Ayoub&B, Covid) Anterior Nasal Swab     Status: None   Collection Time: 10/09/24  4:10 PM   Specimen: Anterior Nasal Swab  Result Value Ref Range Status   SARS Coronavirus 2 by RT PCR NEGATIVE NEGATIVE Final   Influenza Madylin Fairbank by PCR NEGATIVE NEGATIVE Final   Influenza B by PCR NEGATIVE NEGATIVE Final    Comment: (NOTE) The Xpert Xpress SARS-CoV-2/FLU/RSV plus assay is intended as an aid in the diagnosis of influenza from Nasopharyngeal swab specimens and should not be used as Stevie Charter sole basis for treatment. Nasal washings and aspirates are unacceptable for Xpert Xpress SARS-CoV-2/FLU/RSV testing.  Fact Sheet for Patients: bloggercourse.com  Fact Sheet for Healthcare  Providers: seriousbroker.it  This test is not yet approved or cleared by the [place]  FDA and has been authorized for detection and/or diagnosis of SARS-CoV-2 by FDA under an Emergency Use Authorization (EUA). This EUA will remain in effect (meaning this test can be used) for the duration of the COVID-19 declaration under Section 564(b)(1) of the Act, 21 U.S.C. section 360bbb-3(b)(1), unless the authorization is terminated or revoked.     Resp Syncytial Virus by PCR NEGATIVE NEGATIVE Final    Comment: (NOTE) Fact Sheet for Patients: bloggercourse.com  Fact Sheet for Healthcare Providers: seriousbroker.it  This test is not yet approved or cleared by the [place]  FDA and has been authorized for detection and/or diagnosis  of SARS-CoV-2 by FDA under an Emergency Use Authorization (EUA). This EUA will remain in effect (meaning this test can be used) for the duration of the COVID-19 declaration under Section 564(b)(1) of the Act, 21 U.S.C. section 360bbb-3(b)(1), unless the authorization is terminated or revoked.  Performed at [facility], [address] [place], [place] [address]   Urine Culture (for pregnant, neutropenic or urologic patients or patients with an indwelling urinary catheter)     Status: Abnormal   Collection Time: 10/09/24  8:44 PM   Specimen: Urine, Clean Catch  Result Value Ref Range Status   Specimen Description URINE, CLEAN CATCH  Final   Special Requests   Final    NONE Performed at [facility], [address], [place], [place] [address]    Culture 30,000 COLONIES/mL PROTEUS MIRABILIS (Kahlin Mark)  Final   Report Status 10/11/2024 FINAL  Final   Organism ID, Bacteria PROTEUS MIRABILIS (Axelle Szwed)  Final      Susceptibility   Proteus mirabilis - MIC*    AMPICILLIN <=2 SENSITIVE Sensitive     CEFAZOLIN  (URINE) Value in next row Sensitive      4 SENSITIVEThis is Jeimy Bickert modified  FDA-approved test that has been validated and its performance characteristics determined by the reporting laboratory.  This laboratory is certified under the Clinical Laboratory Improvement Amendments CLIA as qualified to perform high complexity clinical laboratory testing.    CEFEPIME Value in next row Sensitive      4 SENSITIVEThis is Maurina Fawaz modified FDA-approved test that has been validated and its performance characteristics determined by the reporting laboratory.  This laboratory is certified under the Clinical Laboratory Improvement Amendments CLIA as qualified to perform high complexity clinical laboratory testing.    ERTAPENEM Value in next row Sensitive      4 SENSITIVEThis is Danialle Dement modified FDA-approved test that has been validated and its performance characteristics determined by the reporting laboratory.  This laboratory is certified under the Clinical Laboratory Improvement Amendments CLIA as qualified to perform high complexity clinical laboratory testing.    CEFTRIAXONE  Value in next row Sensitive      4 SENSITIVEThis is Lajuanda Penick modified FDA-approved test that has been validated and its performance characteristics determined by the reporting laboratory.  This laboratory is certified under the Clinical Laboratory Improvement Amendments CLIA as qualified to perform high complexity clinical laboratory testing.    CIPROFLOXACIN  Value in next row Sensitive      4 SENSITIVEThis is Heydy Montilla modified FDA-approved test that has been validated and its performance characteristics determined by the reporting laboratory.  This laboratory is certified under the Clinical Laboratory Improvement Amendments CLIA as qualified to perform high complexity clinical laboratory testing.    GENTAMICIN  Value in next row Sensitive      4 SENSITIVEThis is Ronie Barnhart modified FDA-approved test that has been validated and its performance characteristics determined by the reporting laboratory.  This laboratory is certified under the Clinical Laboratory  Improvement Amendments CLIA as qualified to perform high complexity clinical laboratory testing.    NITROFURANTOIN Value in next row Resistant      4 SENSITIVEThis is Marieli Rudy modified FDA-approved test that has been validated and its performance characteristics determined by the reporting laboratory.  This laboratory is certified under the Clinical Laboratory Improvement Amendments CLIA as qualified to perform high complexity clinical laboratory testing.    TRIMETH/SULFA Value in next row Sensitive      4 SENSITIVEThis is Levie Owensby modified FDA-approved test that has been validated and its  performance characteristics determined by the reporting laboratory.  This laboratory is certified under the Clinical Laboratory Improvement Amendments CLIA as qualified to perform high complexity clinical laboratory testing.    AMPICILLIN/SULBACTAM Value in next row Sensitive      4 SENSITIVEThis is Leahann Lempke modified FDA-approved test that has been validated and its performance characteristics determined by the reporting laboratory.  This laboratory is certified under the Clinical Laboratory Improvement Amendments CLIA as qualified to perform high complexity clinical laboratory testing.    PIP/TAZO Value in next row Sensitive      <=4 SENSITIVEThis is Leshaun Biebel modified FDA-approved test that has been validated and its performance characteristics determined by the reporting laboratory.  This laboratory is certified under the Clinical Laboratory Improvement Amendments CLIA as qualified to perform high complexity clinical laboratory testing.    MEROPENEM Value in next row Sensitive      <=4 SENSITIVEThis is Dorian Renfro modified FDA-approved test that has been validated and its performance characteristics determined by the reporting laboratory.  This laboratory is certified under the Clinical Laboratory Improvement Amendments CLIA as qualified to perform high complexity clinical laboratory testing.    * 30,000 COLONIES/mL PROTEUS MIRABILIS  MRSA Next Gen by  PCR, Nasal     Status: None   Collection Time: 10/09/24  8:44 PM   Specimen: Urine, Clean Catch; Nasal Swab  Result Value Ref Range Status   MRSA by PCR Next Gen NOT DETECTED NOT DETECTED Final    Comment: (NOTE) The GeneXpert MRSA Assay (FDA approved for NASAL specimens only), is one component of Virda Betters comprehensive MRSA colonization surveillance program. It is not intended to diagnose MRSA infection nor to guide or monitor treatment for MRSA infections. Test performance is not FDA approved in patients less than 72 years old. Performed at [facility], [address], [place], [place] [address]          Radiology Studies: DG CHEST PORT 1 VIEW Result Date: 10/13/2024 EXAM: 1 VIEW(S) XRAY OF THE CHEST 10/13/2024 02:03:44 AM COMPARISON: 10/09/2024 CLINICAL HISTORY: Rales. FINDINGS: LINES, TUBES AND DEVICES: Left subclavian pacemaker. LUNGS AND PLEURA: Mild patchy right upper and left lower lobe opacities, suspicious for pneumonia. No pleural effusion. No pneumothorax. HEART AND MEDIASTINUM: Left subclavian pacemaker. No acute abnormality of the cardiac and mediastinal silhouettes. BONES AND SOFT TISSUES: Right shoulder arthroplasty. No acute osseous abnormality. IMPRESSION: 1. Mild patchy right upper and left lower lobe opacities, suspicious for pneumonia. Electronically signed by: Pinkie Pebbles MD 10/13/2024 02:07 AM EST RP Workstation: [id number]   CARDIAC CATHETERIZATION Result Date: 10/11/2024   Dist LM lesion is 15% stenosed.   Prox Cx lesion is 40% stenosed.   Prox RCA to Mid RCA lesion is 30% stenosed.   Ost LAD to Prox LAD lesion is 30% stenosed. Patent proximal LAD stent with minimal restenosis Mild non-obstructive disease in the mid Circumflex at the ostium of the obtuse marginal branch Mild non-obstructive disease in the mid RCA LVEDP 8 mmHg Recommendations: Continue medical management of CAD        Scheduled Meds:  (feeding supplement) PROSource Plus  30 mL Oral BID  BM   amiodarone   200 mg Oral Daily   apixaban   2.5 mg Oral BID   ascorbic acid   500 mg Oral BID   atorvastatin   40 mg Oral Daily   empagliflozin   10 mg Oral Daily   feeding supplement  237 mL Oral BID BM   isosorbide  mononitrate  30 mg Oral Daily   metoprolol  tartrate  25 mg Oral BID   multivitamin with minerals  1 tablet Oral Daily   pantoprazole   40 mg Oral Daily   sodium chloride  flush  3 mL Intravenous Q12H   sodium chloride  flush  3 mL Intravenous Q12H   sucralfate   1 g Oral BID   thiamine   100 mg Oral Daily   zinc  sulfate (50mg  elemental zinc )  220 mg Oral Daily   Continuous Infusions:     LOS: 3 days    Time spent: over 30 min     Meliton Monte, MD [facility]   To contact the attending provider between 7A-7P or the covering provider during after hours 7P-7A, please log into the web site [email or web address] and access using universal [place] password for that web site. If you do not have the password, please call the hospital operator.  10/13/2024, 8:48 AM

## 2024-10-13 NOTE — Plan of Care (Signed)

## 2024-10-13 NOTE — Progress Notes (Signed)
 Overnight cross coverage  Informed by RN that patient has history of heart failure and has been getting continuous IV fluids since she went for cardiac cath on 12/8.  Patient's blood pressure is elevated and RN reporting crackles on exam.  Blood pressure elevated with systolic in the 180s.  Remainder of vital signs stable.  Afebrile.  Not tachycardic, tachypneic, or hypoxic.    -IV fluids discontinued  -Per review of MAR, patient has been receiving scheduled metoprolol  and it seems cardiology had ordered Imdur  but it has not been given yet as it is scheduled for 10 AM in the morning.  I have changed timing of Imdur  so it can be given now given elevated blood pressure.    -Chest x-ray done and showing right upper and left lower lobe opacities suspicious for pneumonia.  Patient is afebrile and CBC done yesterday was not showing leukocytosis.  ?Pneumonia.  Repeat labs ordered to check WBC count and procalcitonin level.  Also will check BNP and labs ordered to check renal function.

## 2024-10-13 NOTE — Progress Notes (Signed)
 Mobility Specialist: Progress Note   10/13/24 1200  Mobility  Activity Ambulated with assistance  Level of Assistance Contact guard assist, steadying assist  Assistive Device Front wheel walker  Distance Ambulated (ft) 20 ft  Activity Response Tolerated well  Mobility Referral Yes  Mobility visit 1 Mobility  Mobility Specialist Start Time (ACUTE ONLY) [id number]  Mobility Specialist Stop Time (ACUTE ONLY) [id number]  Mobility Specialist Time Calculation (min) (ACUTE ONLY) 33 min    Pt received in bed, agreeable to mobility session. Requested to use the BR. MinA for bed mobility to assist with trunk elevation. MinA for STS from elevated bed height. MinG for short bout of ambulation to/from the BR. Light minA for STS from elevated commode seat. Void successful, pericare done ind. Returned to bed. Left in bed with all needs met, call bell in reach.   Ileana Lute Mobility Specialist Please contact via SecureChat or Rehab office at [phone]

## 2024-10-13 NOTE — Plan of Care (Signed)
  Problem: Education: Goal: Knowledge of General Education information will improve Description: Including pain rating scale, medication(s)/side effects and non-pharmacologic comfort measures Outcome: Progressing   Problem: Clinical Measurements: Goal: Diagnostic test results will improve Outcome: Progressing Goal: Respiratory complications will improve Outcome: Progressing Goal: Cardiovascular complication will be avoided Outcome: Progressing   Problem: Activity: Goal: Risk for activity intolerance will decrease Outcome: Progressing   Problem: Coping: Goal: Level of anxiety will decrease Outcome: Progressing   Problem: Pain Managment: Goal: General experience of comfort will improve and/or be controlled Outcome: Progressing   Problem: Safety: Goal: Ability to remain free from injury will improve Outcome: Progressing

## 2024-10-13 NOTE — Telephone Encounter (Signed)
 Please Advise ?  Copied from CRM [id number]. Topic: Clinical - Medical Advice >> Oct 08, 2024 10:17 AM Burnard DEL wrote: Reason for CRM: Patient called in stating that her urine is dark. She doesn't have any pain or discomfort,but she would like to know if provider could prescribe anything to clear it up?

## 2024-10-13 NOTE — Care Management Important Message (Signed)
 Important Message  Patient Details  Name: Barbara [name] MRN: [id number] Date of Birth: 08-30-1941   Important Message Given:  Yes - Medicare IM     Claretta Deed 10/13/2024, 2:22 PM

## 2024-10-13 NOTE — TOC Progression Note (Addendum)
 Transition of Care [facility]) - Progression Note    Patient Details  Name: Barbara [name] MRN: [id number] Date of Birth: January 13, 1941  Transition of Care [facility]) CM/SW Contact  Lauraine FORBES Saa, LCSWA Phone Number: 10/13/2024, 12:46 PM  Clinical Narrative:     12:46 PM CSW attempted to submit SNF and ambulance transport insurance authorization request for patient to discharge to [facility] SNF via [facility]. There was no response and a voicemail was left. CSW will continue to follow.  1:35 PM HTA returned CSW phone call. CSW submitted SNF and ambulance transport insurance authorization. Patient insurance authorization's are pending. CSW will continue to follow.  Expected Discharge Plan: Skilled Nursing Facility Barriers to Discharge: English As A Second Language Teacher, Continued Medical Work up               Expected Discharge Plan and Services In-house Referral: Clinical Social Work   Post Acute Care Choice: Skilled Nursing Facility Living arrangements for the past 2 months: Single Family Home                                       Social Drivers of Health (SDOH) Interventions SDOH Screenings   Food Insecurity: No Food Insecurity (10/09/2024)  Housing: High Risk (06/20/2024)  Transportation Needs: Unmet Transportation Needs (06/20/2024)  Utilities: Not At Risk (10/09/2024)  Alcohol Screen: Low Risk  (08/05/2022)  Depression (PHQ2-9): Low Risk  (04/05/2024)  Financial Resource Strain: Low Risk  (08/05/2022)  Physical Activity: Insufficiently Active (07/02/2022)  Social Connections: Moderately Integrated (06/20/2024)  Stress: No Stress Concern Present (07/02/2022)  Tobacco Use: Medium Risk (10/09/2024)    Readmission Risk Interventions    05/24/2024    8:57 AM 03/11/2024    1:07 PM  Readmission Risk Prevention Plan  Transportation Screening Complete Complete  PCP or Specialist Appt within 5-7 Days  Complete  Home Care Screening  Complete  Medication Review (RN CM)  Complete  HRI  or Home Care Consult Complete   Social Work Consult for Recovery Care Planning/Counseling Complete   Palliative Care Screening Not Applicable   Medication Review Oceanographer) Complete

## 2024-10-14 DIAGNOSIS — I2510 Atherosclerotic heart disease of native coronary artery without angina pectoris: Secondary | ICD-10-CM

## 2024-10-14 LAB — CBC WITH DIFFERENTIAL/PLATELET
Abs Immature Granulocytes: 0.15 K/uL — ABNORMAL HIGH (ref 0.00–0.07)
Basophils Absolute: 0 K/uL (ref 0.0–0.1)
Basophils Relative: 1 %
Eosinophils Absolute: 0.2 K/uL (ref 0.0–0.5)
Eosinophils Relative: 6 %
HCT: 41.9 % (ref 36.0–46.0)
Hemoglobin: 13.4 g/dL (ref 12.0–15.0)
Immature Granulocytes: 4 %
Lymphocytes Relative: 23 %
Lymphs Abs: 0.9 K/uL (ref 0.7–4.0)
MCH: 28.3 pg (ref 26.0–34.0)
MCHC: 32 g/dL (ref 30.0–36.0)
MCV: 88.4 fL (ref 80.0–100.0)
Monocytes Absolute: 0.4 K/uL (ref 0.1–1.0)
Monocytes Relative: 11 %
Neutro Abs: 2 K/uL (ref 1.7–7.7)
Neutrophils Relative %: 55 %
Platelets: 310 K/uL (ref 150–400)
RBC: 4.74 MIL/uL (ref 3.87–5.11)
RDW: 17.8 % — ABNORMAL HIGH (ref 11.5–15.5)
WBC: 3.7 K/uL — ABNORMAL LOW (ref 4.0–10.5)
nRBC: 0 % (ref 0.0–0.2)

## 2024-10-14 LAB — COMPREHENSIVE METABOLIC PANEL WITH GFR
ALT: 18 U/L (ref 0–44)
AST: 28 U/L (ref 15–41)
Albumin: 2.4 g/dL — ABNORMAL LOW (ref 3.5–5.0)
Alkaline Phosphatase: 126 U/L (ref 38–126)
Anion gap: 6 (ref 5–15)
BUN: 23 mg/dL (ref 8–23)
CO2: 28 mmol/L (ref 22–32)
Calcium: 10 mg/dL (ref 8.9–10.3)
Chloride: 102 mmol/L (ref 98–111)
Creatinine, Ser: 1.18 mg/dL — ABNORMAL HIGH (ref 0.44–1.00)
GFR, Estimated: 45 mL/min — ABNORMAL LOW (ref >60)
Glucose, Bld: 111 mg/dL — ABNORMAL HIGH (ref 70–99)
Potassium: 3.9 mmol/L (ref 3.5–5.1)
Sodium: 136 mmol/L (ref 135–145)
Total Bilirubin: 0.7 mg/dL (ref 0.0–1.2)
Total Protein: 6.1 g/dL — ABNORMAL LOW (ref 6.5–8.1)

## 2024-10-14 LAB — MAGNESIUM: Magnesium: 1.8 mg/dL (ref 1.7–2.4)

## 2024-10-14 LAB — PHOSPHORUS: Phosphorus: 2.4 mg/dL — ABNORMAL LOW (ref 2.5–4.6)

## 2024-10-14 MED ORDER — K PHOS MONO-SOD PHOS DI & MONO 155-852-130 MG PO TABS
250.0000 mg | ORAL_TABLET | Freq: Two times a day (BID) | ORAL | Status: AC
  Administered 2024-10-14 (×2): 250 mg via ORAL
  Filled 2024-10-14 (×2): qty 1

## 2024-10-14 NOTE — Progress Notes (Signed)
 PT Cancellation Note  Patient Details Name: DYNASIA KERCHEVAL MRN: [id number] DOB: 1941/05/03   Cancelled Treatment:    Reason Eval/Treat Not Completed: (P) Fatigue/lethargy limiting ability to participate, pt declining session stating she just returned to be and requests to rest. Will check back as schedule allows to continue with PT POC.  Therisa SAUNDERS. PTA Acute Rehabilitation Services Office: [phone]    Therisa CHRISTELLA Boor 10/14/2024, 4:29 PM

## 2024-10-14 NOTE — Telephone Encounter (Signed)
 She is likely dehydrated, so tell her to drink lots of water . If this does not resolve, make an OV for her

## 2024-10-14 NOTE — Progress Notes (Addendum)
 [facility]  PROGRESS NOTE  Barbara [name] FMW:[id number] DOB: [birth year] DOA: 10/09/2024 PCP: Johnny Garnette LABOR, MD   Brief HPI:   83 y.o. female with medical history significant of CAD, a.fib on eliquis , HTN, HLD, GERD, CKD3a, SSS sp pacemaker here with chest pain, being treated for NSTEMI.     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 Lasix  40 mg daily  Recommend monitoring daily weights and can take Lasix  40 mg if gains more than 3 pounds in 1 day or 5 pounds in 1 week   Anion Gap Metabolic Acidosis resolved   Hypertension Metoprolol    Atrial Fibrillation Eliquis   Metoprolol , amiodarone    CKD IIIb Creatinine appears close to baseline   GERD PPI   Tachybrady S/p pacemaker placement  Hypophosphatemia Phosphorus 2.4 Will give Neutra-Phos to 50 mg p.o. twice daily for 2 doses Check phosphorus level in a.m.   Disposition Awaiting to go to skilled nursing facility     DVT prophylaxis: Apixaban   Medications     (feeding supplement) PROSource Plus  30 mL Oral BID BM   amiodarone   200 mg Oral Daily   apixaban   2.5 mg Oral BID   ascorbic acid   500 mg Oral BID   atorvastatin   40 mg Oral Daily   empagliflozin   10 mg Oral Daily   feeding supplement  237 mL Oral BID BM   furosemide   40 mg Oral Daily   isosorbide  mononitrate  30 mg Oral Daily   metoprolol  tartrate  25 mg Oral BID   multivitamin with minerals   1 tablet Oral Daily   pantoprazole   40 mg Oral Daily   polyethylene glycol  17 g Oral BID   senna-docusate  1 tablet Oral QHS   sodium chloride  flush  3 mL Intravenous Q12H   sodium chloride  flush  3 mL Intravenous Q12H   sucralfate   1 g Oral BID   thiamine   100 mg Oral Daily   zinc  sulfate (50mg  elemental zinc )  220 mg Oral Daily     Data Reviewed:   CBG:  No results for input(s): GLUCAP in the last 168 hours.  SpO2: 98 % O2 Flow Rate (L/min): 2 L/min    Vitals:   10/13/24 2200 10/13/24 2226 10/14/24 0200 10/14/24 0728  BP: (!) 146/54 (!) 142/56 (!) 120/55   Pulse: 60 60 60 62  Resp: 20 15 (!) 21 16  Temp:  98.1 F (36.7 C) 98.2 F (36.8 C) 98.7 F (37.1 C)  TempSrc:  Oral Oral Oral  SpO2: 96% 97% 94% 98%  Weight:   59.2 kg   Height:          Data Reviewed:  Basic Metabolic Panel: Recent Labs  Lab 10/10/24 0957 10/10/24 1349 10/11/24 0319 10/12/24 0214 10/13/24 0404 10/14/24 0251  NA 145 137 138 139 138 136  K 4.3 4.1 3.4* 4.2 3.9 3.9  CL 111 105 100 108 108 102  CO2 16* 24 25 23 23 28   GLUCOSE 112* 96 78 90 86  111*  BUN 18 22 26* 26* 21 23  CREATININE 1.38* 1.31* 1.38* 1.34* 1.35* 1.18*  CALCIUM  10.2 9.5 9.8 9.3 10.1 10.0  MG 2.3  QUANTITY NOT SUFFICIENT, UNABLE TO PERFORM TEST 1.8 1.8 1.8 1.7 1.8  PHOS 3.5  --  3.1 2.5 2.0* 2.4*    CBC: Recent Labs  Lab 10/10/24 0957 10/11/24 0319 10/12/24 0214 10/13/24 0404 10/14/24 0251  WBC 4.1 3.8* 3.8* 4.4 3.7*  NEUTROABS  --   --   --   --  2.0  HGB 14.2 13.7 13.1 13.2 13.4  HCT 44.9 41.1 41.2 41.1 41.9  MCV 88.7 87.4 89.8 88.0 88.4  PLT 216 231 257 279 310    LFT Recent Labs  Lab 10/10/24 0957 10/11/24 0319 10/12/24 0214 10/13/24 0404 10/14/24 0251  AST 44*  --   --   --  28  ALT 11  --   --   --  18  ALKPHOS 109  --   --   --  126  BILITOT QUANTITY NOT SUFFICIENT, UNABLE TO PERFORM TEST  --   --   --  0.7  PROT 6.5  --   --   --  6.1*  ALBUMIN 2.6* 2.4* 2.2* 2.4* 2.4*      Antibiotics: Anti-infectives (From admission, onward)    None        CONSULTS cardiology  Code Status: Full code  Family Communication: No family at bedside     Subjective   Denies chest pain or shortness of breath   Objective    Physical Examination:   General-appears in no acute distress Heart-S1-S2, regular, no murmur auscultated Lungs-clear to auscultation bilaterally, no wheezing or crackles auscultated Abdomen-soft, nontender, no organomegaly Extremities-no edema in the lower extremities Neuro-alert, oriented x3, no focal deficit noted    Wound 10/09/24 2112 Pressure Injury Sacrum Deep Tissue Pressure Injury - Purple or maroon localized area of discolored intact skin or blood-filled blister due to damage of underlying soft tissue from pressure and/or shear. (Active)        Sabas GORMAN Brod   [facility] If 7PM-7AM, please contact night-coverage at [email or web address], Office  [phone]   10/14/2024, 8:29 AM  LOS: 4 days

## 2024-10-14 NOTE — TOC Progression Note (Addendum)
 Transition of Care [facility]) - Progression Note    Patient Details  Name: Barbara [name] MRN: [id number] Date of Birth: [birth year]  Transition of Care [facility]) CM/SW Contact  Lauraine FORBES Saa, LCSWA Phone Number: 10/14/2024, 10:24 AM  Clinical Narrative:     10:24 AM Patient's authorization request for ambulance transport was approved (ID [id number]) and SNF insurance authorization request went peer to peer. Medical team made aware. CSW will continue to follow.  4:12 PM CSW made aware of SNF insurance authorization denial due to primarily custodial care needs. CSW relayed information to patient who expressed interest in appealing but deferred decision to son. Son was made aware of denial who stated that he was interested in appealing. CSW to provide denial letter when provided to this CSW by HTA. CSW will continue to follow.  Expected Discharge Plan: Skilled Nursing Facility Barriers to Discharge: English As A Second Language Teacher, Continued Medical Work up               Expected Discharge Plan and Services In-house Referral: Clinical Social Work   Post Acute Care Choice: Skilled Nursing Facility Living arrangements for the past 2 months: Single Family Home                                       Social Drivers of Health (SDOH) Interventions SDOH Screenings   Food Insecurity: No Food Insecurity (10/09/2024)  Housing: High Risk (06/20/2024)  Transportation Needs: Unmet Transportation Needs (06/20/2024)  Utilities: Not At Risk (10/09/2024)  Alcohol Screen: Low Risk (08/05/2022)  Depression (PHQ2-9): Low Risk (04/05/2024)  Financial Resource Strain: Low Risk (08/05/2022)  Physical Activity: Insufficiently Active (07/02/2022)  Social Connections: Moderately Integrated (06/20/2024)  Stress: No Stress Concern Present (07/02/2022)  Tobacco Use: Medium Risk (10/09/2024)    Readmission Risk Interventions    05/24/2024    8:57 AM 03/11/2024    1:07 PM  Readmission Risk Prevention Plan   Transportation Screening Complete Complete  PCP or Specialist Appt within 5-7 Days  Complete  Home Care Screening  Complete  Medication Review (RN CM)  Complete  HRI or Home Care Consult Complete   Social Work Consult for Recovery Care Planning/Counseling Complete   Palliative Care Screening Not Applicable   Medication Review Oceanographer) Complete

## 2024-10-14 NOTE — Plan of Care (Signed)

## 2024-10-15 LAB — BASIC METABOLIC PANEL WITH GFR
Anion gap: 9 (ref 5–15)
BUN: 34 mg/dL — ABNORMAL HIGH (ref 8–23)
CO2: 27 mmol/L (ref 22–32)
Calcium: 10.4 mg/dL — ABNORMAL HIGH (ref 8.9–10.3)
Chloride: 102 mmol/L (ref 98–111)
Creatinine, Ser: 1.55 mg/dL — ABNORMAL HIGH (ref 0.44–1.00)
GFR, Estimated: 33 mL/min — ABNORMAL LOW (ref >60)
Glucose, Bld: 79 mg/dL (ref 70–99)
Potassium: 4.5 mmol/L (ref 3.5–5.1)
Sodium: 138 mmol/L (ref 135–145)

## 2024-10-15 LAB — PHOSPHORUS: Phosphorus: 3.3 mg/dL (ref 2.5–4.6)

## 2024-10-15 NOTE — TOC Progression Note (Addendum)
 Transition of Care [facility]) - Progression Note    Patient Details  Name: Barbara [name] MRN: [id number] Date of Birth: [birth year]  Transition [facility] Care [facility]) CM/SW Contact  Lauraine FORBES Saa, LCSWA Phone Number: 10/15/2024, 3:06 PM  Clinical Narrative:     3:06 PM CSW informed patient and medical team of SNF fast appeal submission today. CSW provided patient with insurance authorization SNF denial letter. Patient reiterated to CSW that PCS only assist approximately two hours two a week. CSW informed patient's son, Sallee, of SNF appeal. CSW will continue to follow.  Expected Discharge Plan: Skilled Nursing Facility Barriers to Discharge: Continued Medical Work up, Other (must enter comment) (SNF pending Appeal)               Expected Discharge Plan and Services In-house Referral: Clinical Social Work   Post Acute Care Choice: Skilled Nursing Facility Living arrangements for the past 2 months: Single Family Home                                       Social Drivers of Health (SDOH) Interventions SDOH Screenings   Food Insecurity: No Food Insecurity (10/09/2024)  Housing: High Risk (06/20/2024)  Transportation Needs: Unmet Transportation Needs (06/20/2024)  Utilities: Not At Risk (10/09/2024)  Alcohol Screen: Low Risk (08/05/2022)  Depression (PHQ2-9): Low Risk (04/05/2024)  Financial Resource Strain: Low Risk (08/05/2022)  Physical Activity: Insufficiently Active (07/02/2022)  Social Connections: Moderately Integrated (06/20/2024)  Stress: No Stress Concern Present (07/02/2022)  Tobacco Use: Medium Risk (10/09/2024)    Readmission Risk Interventions    05/24/2024    8:57 AM 03/11/2024    1:07 PM  Readmission Risk Prevention Plan  Transportation Screening Complete Complete  PCP or Specialist Appt within 5-7 Days  Complete  Home Care Screening  Complete  Medication Review (RN CM)  Complete  HRI or Home Care Consult Complete   Social Work Consult for Recovery Care  Planning/Counseling Complete   Palliative Care Screening Not Applicable   Medication Review Oceanographer) Complete

## 2024-10-15 NOTE — Progress Notes (Addendum)
 Physical Therapy Treatment Patient Details Name: Barbara [name] MRN: [id number] DOB: 06/05/1941 Today's Date: 10/15/2024   History of Present Illness 83 yo female adm 12/6 with SOB and chest pain. Admitted for NSTEMI and acute CHF exacerbation. PMH includes: CAD, HFpEF, A-fib s/p PPM, essential HTN, HLD, anxiety, CKD, anemia, Rt TSA, vertigo, Lt femur fx    PT Comments  Pt resting in bed on arrival, pleasant and agreeable to session and demonstrating steady progress towards acute goals. Pt continues to be limited in safe mobility by LE weakness, decreased activity tolerance and impaired balance/postural reactions. Pt requiring mod A to complete bed mobility and up to mod A to transfer sit<>stand. Pt performing x2 gait bouts with RW for support and min A to maintain balance. Pt with continued low clearance of bil LE during gait and x4 instances of knee buckling, R and L, needing min A to maintain balance. Pt continues to be at high risk for falls due to deficits described above and will benefit from continued inpatient follow up therapy, <3 hours/day to address deficits and maximize functional independence and safety. Pt continues to benefit from skilled PT services to progress toward functional mobility goals.     If plan is discharge home, recommend the following: A little help with walking and/or transfers;A little help with bathing/dressing/bathroom;Assistance with cooking/housework;Assist for transportation;Help with stairs or ramp for entrance   Can travel by private vehicle     Yes  Equipment Recommendations  None recommended by PT    Recommendations for Other Services       Precautions / Restrictions Precautions Precautions: Fall Recall of Precautions/Restrictions: Intact Restrictions Weight Bearing Restrictions Per Provider Order: No     Mobility  Bed Mobility Overal bed mobility: Needs Assistance Bed Mobility: Supine to Sit, Sit to Supine     Supine to sit: Mod  assist     General bed mobility comments: modA to elevate trunk to sitting, cues to scoot forward to EOB in prep for standing, pt seated up EOB at end of session    Transfers Overall transfer level: Needs assistance Equipment used: Rolling walker (2 wheels) Transfers: Sit to/from Stand Sit to Stand: Min assist, Mod assist           General transfer comment: modA for initial stand from low bed surface, min A from [facility] over commode with cues for hand placement    Ambulation/Gait Ambulation/Gait assistance: Min assist Gait Distance (Feet): 15 Feet (x2) Assistive device: Rolling walker (2 wheels) Gait Pattern/deviations: Step-through pattern, Decreased stride length, Trunk flexed Gait velocity: decreased     General Gait Details: pt maintains trunk and head flexed, small steps with minimal clearance. bil knee buckling with x4 with pt needing min A to maintain balance SpO2 >95%   Stairs             Wheelchair Mobility     Tilt Bed    Modified Rankin (Stroke Patients Only)       Balance Overall balance assessment: Needs assistance Sitting-balance support: No upper extremity supported, Feet supported Sitting balance-Leahy Scale: Good     Standing balance support: Bilateral upper extremity supported, During functional activity Standing balance-Leahy Scale: Fair Standing balance comment: static standing without AD                            Communication Communication Communication: No apparent difficulties  Cognition Arousal: Alert Behavior During Therapy: WFL for tasks assessed/performed  PT - Cognitive impairments: No family/caregiver present to determine baseline, Initiation, Sequencing, Problem solving, Safety/Judgement                       PT - Cognition Comments: pt needing cues for mobility and transfer technique, Following commands: Impaired Following commands impaired: Follows one step commands with increased time     Cueing Cueing Techniques: Verbal cues  Exercises      General Comments General comments (skin integrity, edema, etc.): VSS on RA      Pertinent Vitals/Pain      Home Living                          Prior Function            PT Goals (current goals can now be found in the care plan section) Acute Rehab PT Goals Patient Stated Goal: to improve strength and go home PT Goal Formulation: With patient Time For Goal Achievement: 10/24/24 Progress towards PT goals: Progressing toward goals (slowly)    Frequency    Min 2X/week      PT Plan      Co-evaluation              AM-PAC PT 6 Clicks Mobility   Outcome Measure  Help needed turning from your back to your side while in a flat bed without using bedrails?: A Little Help needed moving from lying on your back to sitting on the side of a flat bed without using bedrails?: A Little Help needed moving to and from a bed to a chair (including a wheelchair)?: A Lot Help needed standing up from a chair using your arms (e.g., wheelchair or bedside chair)?: A Lot Help needed to walk in hospital room?: A Lot Help needed climbing 3-5 steps with a railing? : Total 6 Click Score: 13    End of Session Equipment Utilized During Treatment: Gait belt Activity Tolerance: Patient tolerated treatment well Patient left: with call bell/phone within reach;in bed;with bed alarm set Nurse Communication: Mobility status PT Visit Diagnosis: Unsteadiness on feet (R26.81);Other abnormalities of gait and mobility (R26.89);Repeated falls (R29.6);Muscle weakness (generalized) (M62.81)     Time: 8695-8661 PT Time Calculation (min) (ACUTE ONLY): 34 min  Charges:    $Gait Training: 8-22 mins $Therapeutic Activity: 8-22 mins PT General Charges $$ ACUTE PT VISIT: 1 Visit                     Avya Flavell R. PTA Acute Rehabilitation Services Office: [phone]   Therisa CHRISTELLA Boor 10/15/2024, 1:45 PM

## 2024-10-15 NOTE — Progress Notes (Signed)
 [facility]  PROGRESS NOTE  Barbara [name] FMW:[id number] DOB: 12-Jul-1941 DOA: 10/09/2024 PCP: Johnny Garnette LABOR, MD   Brief HPI:   83 y.o. female with medical history significant of CAD, a.fib on eliquis , HTN, HLD, GERD, CKD3a, SSS sp pacemaker here with chest pain, being treated for NSTEMI.     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 Lasix  40 mg daily  Recommend monitoring daily weights and can take Lasix  40 mg if gains more than 3 pounds in 1 day or 5 pounds in 1 week   Anion Gap Metabolic Acidosis resolved   Hypertension Metoprolol  Blood pressure well-controlled   Atrial Fibrillation Eliquis   Metoprolol , amiodarone    CKD IIIb Creatinine appears close to baseline   GERD PPI   Tachybrady S/p pacemaker placement  Hypophosphatemia Phosphorus 2.4 Will give Neutra-Phos to 50 mg p.o. twice daily for 2 doses Check phosphorus level in a.m.   Disposition Denied by insurance company to go to skilled facility.  Patient has appealed against her decision.  Awaiting the results.     DVT prophylaxis: Apixaban   Medications     (feeding supplement) PROSource Plus  30 mL Oral BID BM   amiodarone   200 mg Oral Daily   apixaban   2.5 mg Oral BID   ascorbic acid   500 mg Oral BID   atorvastatin   40 mg Oral Daily   empagliflozin   10 mg Oral Daily   feeding supplement  237 mL Oral BID BM   furosemide   40 mg Oral Daily    isosorbide  mononitrate  30 mg Oral Daily   metoprolol  tartrate  25 mg Oral BID   multivitamin with minerals  1 tablet Oral Daily   pantoprazole   40 mg Oral Daily   polyethylene glycol  17 g Oral BID   senna-docusate  1 tablet Oral QHS   sodium chloride  flush  3 mL Intravenous Q12H   sodium chloride  flush  3 mL Intravenous Q12H   sucralfate   1 g Oral BID   thiamine   100 mg Oral Daily   zinc  sulfate (50mg  elemental zinc )  220 mg Oral Daily     Data Reviewed:   CBG:  No results for input(s): GLUCAP in the last 168 hours.  SpO2: 95 % O2 Flow Rate (L/min): 2 L/min    Vitals:   10/15/24 0452 10/15/24 0743 10/15/24 1100 10/15/24 1147  BP:  (!) 143/70  (!) 112/48  Pulse:  60  61  Resp:  19  16  Temp:  98.4 F (36.9 C) (P) 98 F (36.7 C) 98 F (36.7 C)  TempSrc:  Oral  Oral  SpO2:  98%  95%  Weight: 57.9 kg     Height:          Data Reviewed:  Basic Metabolic Panel: Recent Labs  Lab 10/10/24 1349 10/11/24 0319 10/12/24 0214 10/13/24 0404 10/14/24 0251 10/15/24 0223  NA 137 138 139 138 136 138  K 4.1 3.4* 4.2 3.9 3.9 4.5  CL 105 100 108 108 102 102  CO2 24 25 23 23 28 27   GLUCOSE 96 78 90 86 111* 79  BUN 22 26* 26* 21 23 34*  CREATININE 1.31* 1.38* 1.34* 1.35* 1.18* 1.55*  CALCIUM  9.5 9.8 9.3 10.1 10.0 10.4*  MG 1.8 1.8 1.8 1.7 1.8  --   PHOS  --  3.1 2.5 2.0* 2.4* 3.3    CBC: Recent Labs  Lab 10/10/24 0957 10/11/24 0319 10/12/24 0214 10/13/24 0404 10/14/24 0251  WBC 4.1 3.8* 3.8* 4.4 3.7*  NEUTROABS  --   --   --   --  2.0  HGB 14.2 13.7 13.1 13.2 13.4  HCT 44.9 41.1 41.2 41.1 41.9  MCV 88.7 87.4 89.8 88.0 88.4  PLT 216 231 257 279 310    LFT Recent Labs  Lab 10/10/24 0957 10/11/24 0319 10/12/24 0214 10/13/24 0404 10/14/24 0251  AST 44*  --   --   --  28  ALT 11  --   --   --  18  ALKPHOS 109  --   --   --  126  BILITOT QUANTITY NOT SUFFICIENT, UNABLE TO PERFORM TEST  --   --   --  0.7  PROT 6.5  --   --   --  6.1*  ALBUMIN 2.6*  2.4* 2.2* 2.4* 2.4*     Antibiotics: Anti-infectives (From admission, onward)    None        CONSULTS cardiology  Code Status: Full code  Family Communication: No family at bedside     Subjective   Denies any complaints   Objective    Physical Examination:  General-appears in no acute distress Heart-S1-S2, regular, no murmur auscultated Lungs-clear to auscultation bilaterally, no wheezing or crackles auscultated Abdomen-soft, nontender, no organomegaly Extremities-no edema in the lower extremities Neuro-alert, oriented x3, no focal deficit noted     Wound 10/09/24 2112 Pressure Injury Sacrum Deep Tissue Pressure Injury - Purple or maroon localized area of discolored intact skin or blood-filled blister due to damage of underlying soft tissue from pressure and/or shear. (Active)        Barbara [name]   [facility] If 7PM-7AM, please contact night-coverage at [email or web address], Office  [phone]   10/15/2024, 1:35 PM  LOS: 5 days

## 2024-10-15 NOTE — Plan of Care (Signed)

## 2024-10-15 NOTE — Plan of Care (Signed)
  Problem: Clinical Measurements: Goal: Will remain free from infection Outcome: Progressing Goal: Diagnostic test results will improve Outcome: Progressing Goal: Respiratory complications will improve Outcome: Progressing Goal: Cardiovascular complication will be avoided Outcome: Progressing   Problem: Activity: Goal: Risk for activity intolerance will decrease Outcome: Progressing   Problem: Nutrition: Goal: Adequate nutrition will be maintained Outcome: Progressing   Problem: Coping: Goal: Level of anxiety will decrease Outcome: Progressing   Problem: Pain Managment: Goal: General experience of comfort will improve and/or be controlled Outcome: Progressing   Problem: Safety: Goal: Ability to remain free from injury will improve Outcome: Progressing

## 2024-10-16 DIAGNOSIS — D649 Anemia, unspecified: Secondary | ICD-10-CM | POA: Diagnosis not present

## 2024-10-16 DIAGNOSIS — I480 Paroxysmal atrial fibrillation: Secondary | ICD-10-CM | POA: Diagnosis not present

## 2024-10-16 DIAGNOSIS — I214 Non-ST elevation (NSTEMI) myocardial infarction: Secondary | ICD-10-CM | POA: Diagnosis not present

## 2024-10-16 DIAGNOSIS — I2510 Atherosclerotic heart disease of native coronary artery without angina pectoris: Secondary | ICD-10-CM | POA: Diagnosis not present

## 2024-10-16 NOTE — Progress Notes (Signed)
 [facility]  PROGRESS NOTE  Barbara [name] FMW:[id number] DOB: 17-Jan-1941 DOA: 10/09/2024 PCP: Johnny Garnette LABOR, MD   Brief HPI:   83 y.o. female with medical history significant of CAD, a.fib on eliquis , HTN, HLD, GERD, CKD3a, SSS sp pacemaker here with chest pain, being treated for NSTEMI.     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 Lasix  40 mg daily  Recommend monitoring daily weights and can take Lasix  40 mg if gains more than 3 pounds in 1 day or 5 pounds in 1 week   Anion Gap Metabolic Acidosis resolved   Hypertension Metoprolol  Blood pressure well-controlled   Atrial Fibrillation Eliquis   Metoprolol , amiodarone    CKD IIIb Creatinine appears close to baseline   GERD PPI   Tachybrady S/p pacemaker placement  Hypophosphatemia Replete  Hypercalcemia Mild elevation of calcium  Likely in setting of Carafate ; patient is not taking Carafate  at home Will discontinue Carafate  Follow serum calcium  in a.m.   Disposition Denied by insurance company to go to skilled facility.  Patient has appealed against her decision.  Awaiting the results.     DVT prophylaxis: Apixaban   Medications     (feeding supplement) PROSource Plus  30 mL Oral BID BM   amiodarone   200 mg Oral Daily   apixaban   2.5 mg Oral BID   ascorbic acid   500 mg Oral BID   atorvastatin   40 mg Oral Daily   empagliflozin   10 mg Oral Daily    feeding supplement  237 mL Oral BID BM   furosemide   40 mg Oral Daily   isosorbide  mononitrate  30 mg Oral Daily   metoprolol  tartrate  25 mg Oral BID   multivitamin with minerals  1 tablet Oral Daily   pantoprazole   40 mg Oral Daily   polyethylene glycol  17 g Oral BID   senna-docusate  1 tablet Oral QHS   sodium chloride  flush  3 mL Intravenous Q12H   sodium chloride  flush  3 mL Intravenous Q12H   sucralfate   1 g Oral BID   thiamine   100 mg Oral Daily   zinc  sulfate (50mg  elemental zinc )  220 mg Oral Daily     Data Reviewed:   CBG:  No results for input(s): GLUCAP in the last 168 hours.  SpO2: 97 % O2 Flow Rate (L/min): 2 L/min    Vitals:   10/15/24 1942 10/15/24 2333 10/16/24 0504 10/16/24 0823  BP: (!) 141/64 (!) 124/55 128/66 135/76  Pulse: 62 60 60   Resp: 18 20 17 14   Temp: 97.6 F (36.4 C) 98.8 F (37.1 C) 98.2 F (36.8 C) 98.3 F (36.8 C)  TempSrc: Oral Oral Oral Oral  SpO2: 98% 96% 97%   Weight:   58.3 kg   Height:          Data Reviewed:  Basic Metabolic Panel: Recent Labs  Lab 10/10/24 1349 10/11/24 0319 10/12/24 0214 10/13/24 0404 10/14/24 0251 10/15/24 0223  NA 137 138 139 138 136 138  K 4.1 3.4* 4.2 3.9  3.9 4.5  CL 105 100 108 108 102 102  CO2 24 25 23 23 28 27   GLUCOSE 96 78 90 86 111* 79  BUN 22 26* 26* 21 23 34*  CREATININE 1.31* 1.38* 1.34* 1.35* 1.18* 1.55*  CALCIUM  9.5 9.8 9.3 10.1 10.0 10.4*  MG 1.8 1.8 1.8 1.7 1.8  --   PHOS  --  3.1 2.5 2.0* 2.4* 3.3    CBC: Recent Labs  Lab 10/10/24 0957 10/11/24 0319 10/12/24 0214 10/13/24 0404 10/14/24 0251  WBC 4.1 3.8* 3.8* 4.4 3.7*  NEUTROABS  --   --   --   --  2.0  HGB 14.2 13.7 13.1 13.2 13.4  HCT 44.9 41.1 41.2 41.1 41.9  MCV 88.7 87.4 89.8 88.0 88.4  PLT 216 231 257 279 310    LFT Recent Labs  Lab 10/10/24 0957 10/11/24 0319 10/12/24 0214 10/13/24 0404 10/14/24 0251  AST 44*  --   --   --  28  ALT 11  --   --   --  18  ALKPHOS 109  --   --   --  126   BILITOT QUANTITY NOT SUFFICIENT, UNABLE TO PERFORM TEST  --   --   --  0.7  PROT 6.5  --   --   --  6.1*  ALBUMIN 2.6* 2.4* 2.2* 2.4* 2.4*     Antibiotics: Anti-infectives (From admission, onward)    None        CONSULTS cardiology  Code Status: Full code  Family Communication: No family at bedside     Subjective   Denies chest pain or shortness of breath   Objective    Physical Examination:  Appears in no acute distress S1-S2, regular, no murmur auscultated Lungs are clear to auscultation bilaterally Extremities no edema    Wound 10/09/24 2112 Pressure Injury Sacrum Deep Tissue Pressure Injury - Purple or maroon localized area of discolored intact skin or blood-filled blister due to damage of underlying soft tissue from pressure and/or shear. (Active)        Sabas GORMAN Brod   [facility] If 7PM-7AM, please contact night-coverage at [email or web address], Office  [phone]   10/16/2024, 9:44 AM  LOS: 6 days

## 2024-10-16 NOTE — Progress Notes (Signed)
 Mobility Specialist Progress Note;   10/16/24 1033  Mobility  Activity Ambulated with assistance  Level of Assistance Minimal assist, patient does 75% or more  Assistive Device Front wheel walker  Distance Ambulated (ft) 75 ft  Activity Response Tolerated well  Mobility Referral Yes  Mobility visit 1 Mobility  Mobility Specialist Start Time (ACUTE ONLY) 1033  Mobility Specialist Stop Time (ACUTE ONLY) 1056  Mobility Specialist Time Calculation (min) (ACUTE ONLY) 23 min   Pt agreeable to mobility. Required MinA for bed mobility and during ambulation. Still presenting L knee buckle throughout ambulation. Took 1x seated rest break d/t BLE fatigue. VSS throughout. Pt returned back to bed and left with all needs met, alarm on.   Lauraine Erm Mobility Specialist Please contact via SecureChat or [employer] [phone]

## 2024-10-17 LAB — CBC
HCT: 43.1 % (ref 36.0–46.0)
Hemoglobin: 13.9 g/dL (ref 12.0–15.0)
MCH: 28.7 pg (ref 26.0–34.0)
MCHC: 32.3 g/dL (ref 30.0–36.0)
MCV: 89 fL (ref 80.0–100.0)
Platelets: 248 K/uL (ref 150–400)
RBC: 4.84 MIL/uL (ref 3.87–5.11)
RDW: 17.4 % — ABNORMAL HIGH (ref 11.5–15.5)
WBC: 5.8 K/uL (ref 4.0–10.5)
nRBC: 0 % (ref 0.0–0.2)

## 2024-10-17 LAB — COMPREHENSIVE METABOLIC PANEL WITH GFR
ALT: 19 U/L (ref 0–44)
AST: 39 U/L (ref 15–41)
Albumin: 2.7 g/dL — ABNORMAL LOW (ref 3.5–5.0)
Alkaline Phosphatase: 135 U/L — ABNORMAL HIGH (ref 38–126)
Anion gap: 11 (ref 5–15)
BUN: 40 mg/dL — ABNORMAL HIGH (ref 8–23)
CO2: 26 mmol/L (ref 22–32)
Calcium: 10.8 mg/dL — ABNORMAL HIGH (ref 8.9–10.3)
Chloride: 102 mmol/L (ref 98–111)
Creatinine, Ser: 1.6 mg/dL — ABNORMAL HIGH (ref 0.44–1.00)
GFR, Estimated: 31 mL/min — ABNORMAL LOW (ref >60)
Glucose, Bld: 93 mg/dL (ref 70–99)
Potassium: 4.7 mmol/L (ref 3.5–5.1)
Sodium: 139 mmol/L (ref 135–145)
Total Bilirubin: 0.7 mg/dL (ref 0.0–1.2)
Total Protein: 6.5 g/dL (ref 6.5–8.1)

## 2024-10-17 MED ORDER — MENTHOL 3 MG MT LOZG
1.0000 | LOZENGE | OROMUCOSAL | Status: DC | PRN
  Administered 2024-10-19: 12:00:00 3 mg via ORAL
  Filled 2024-10-17: qty 9

## 2024-10-17 NOTE — Progress Notes (Signed)
 Progress Note    Barbara [name]  FMW:[id number] DOB: 09/23/1941  DOA: 10/09/2024 PCP: Johnny Garnette LABOR, MD      Brief Narrative:    Medical records reviewed and are as summarized below:  Barbara [name] is a 83 y.o. female medical history significant of CAD, a.fib on eliquis , HTN, HLD, GERD, CKD3a, SSS sp pacemaker, who presented to the hospital with chest pain.      Assessment/Plan:   Principal Problem:   Unstable angina (HCC) Active Problems:   NSTEMI (non-ST elevated myocardial infarction) (HCC)   Normocytic anemia   CAD (coronary artery disease/elevated troponin   PAF (paroxysmal atrial fibrillation) (HCC)   Primary hypertension   Chronic kidney disease, stage 3b (HCC)   Hyperlipidemia   Gastroesophageal reflux disease   Tachycardia-bradycardia syndrome (HCC)   Failure to thrive in adult   Acute on chronic diastolic heart failure (HCC)   Hypokalemia   Moderate protein malnutrition   Night sweats   Nutrition Problem: Increased nutrient needs Etiology: chronic illness (CHF, wound healing)  Signs/Symptoms: estimated needs   Body mass index is 19.84 kg/m.   Chest pain, mildly elevated troponins: Peak troponin 805. S/p treatment with IV heparin  drip.  She was seen by the cardiologist. S/p left heart cath with patent proximal LAD stent with minimal restenosis. 2D echo did not show wall motion abnormality. Medical management was recommended.  Continue aspirin , Lipitor and metoprolol .   Acute on chronic diastolic CHF: S/p treatment with IV Lasix . Continue oral Lasix . BNP 960. 2D echo showed EF estimated at 60 to 65%, apical hypertrophic cardiomyopathy, grade 2 diastolic dysfunction, mild MR, moderate left atrial enlargement.   Atrial fibrillation: Continue metoprolol , amiodarone  and Eliquis    Hypophosphatemia: Improved Mildly elevated calcium  level probably in the setting of Carafate  use.   Comorbidities include CKD stage IIIb, CAD,  tachybradycardia syndrome s/p permanent pacemaker placement   PT recommended discharge to SNF.  However, insurance denied authorization to SNF.  Patient has appointed an appeal against this decision.  Result of appeal is still pending.   Diet Order             Diet Heart Room service appropriate? Yes; Fluid consistency: Thin  Diet effective now                                  Consultants: Cardiologist  Procedures: None    Medications:    (feeding supplement) PROSource Plus  30 mL Oral BID BM   amiodarone   200 mg Oral Daily   apixaban   2.5 mg Oral BID   ascorbic acid   500 mg Oral BID   atorvastatin   40 mg Oral Daily   empagliflozin   10 mg Oral Daily   feeding supplement  237 mL Oral BID BM   furosemide   40 mg Oral Daily   isosorbide  mononitrate  30 mg Oral Daily   metoprolol  tartrate  25 mg Oral BID   multivitamin with minerals  1 tablet Oral Daily   pantoprazole   40 mg Oral Daily   polyethylene glycol  17 g Oral BID   senna-docusate  1 tablet Oral QHS   sodium chloride  flush  3 mL Intravenous Q12H   sodium chloride  flush  3 mL Intravenous Q12H   thiamine   100 mg Oral Daily   zinc  sulfate (50mg  elemental zinc )  220 mg Oral Daily   Continuous Infusions:   Anti-infectives (From admission,  onward)    None              Family Communication/Anticipated D/C date and plan/Code Status   DVT prophylaxis: apixaban  (ELIQUIS ) tablet 2.5 mg Start: 10/12/24 0800 apixaban  (ELIQUIS ) tablet 2.5 mg     Code Status: Full Code  Family Communication: None Disposition Plan: Plan to discharge to SNF   Status is: Inpatient Remains inpatient appropriate because: Awaiting placement to SNF       Subjective:   Interval events noted.  No shortness of breath or chest pain  Objective:    Vitals:   10/17/24 0328 10/17/24 0500 10/17/24 0735 10/17/24 1219  BP: (!) 140/72  (!) 162/72 136/61  Pulse: 60     Resp: 17  19   Temp: 97.8 F (36.6  C)  98.8 F (37.1 C) 97.6 F (36.4 C)  TempSrc: Oral  Oral Oral  SpO2: 96%     Weight:  59.2 kg    Height:       No data found.   Intake/Output Summary (Last 24 hours) at 10/17/2024 1325 Last data filed at 10/17/2024 0843 Gross per 24 hour  Intake 360 ml  Output 1200 ml  Net -840 ml   Filed Weights   10/15/24 0452 10/16/24 0504 10/17/24 0500  Weight: 57.9 kg 58.3 kg 59.2 kg    Exam:  GEN: NAD SKIN: Warm and dry EYES: No pallor or icterus ENT: MMM CV: RRR PULM: CTA B ABD: soft, ND, NT, +BS CNS: AAO x 3, non focal EXT: No edema or tenderness    Wound 10/09/24 2112 Pressure Injury Sacrum Deep Tissue Pressure Injury - Purple or maroon localized area of discolored intact skin or blood-filled blister due to damage of underlying soft tissue from pressure and/or shear. (Active)     Data Reviewed:   I have personally reviewed following labs and imaging studies:  Labs: Labs show the following:   Basic Metabolic Panel: Recent Labs  Lab 10/10/24 1349 10/11/24 0319 10/12/24 0214 10/13/24 0404 10/14/24 0251 10/15/24 0223 10/17/24 0248  NA 137 138 139 138 136 138 139  K 4.1 3.4* 4.2 3.9 3.9 4.5 4.7  CL 105 100 108 108 102 102 102  CO2 24 25 23 23 28 27 26   GLUCOSE 96 78 90 86 111* 79 93  BUN 22 26* 26* 21 23 34* 40*  CREATININE 1.31* 1.38* 1.34* 1.35* 1.18* 1.55* 1.60*  CALCIUM  9.5 9.8 9.3 10.1 10.0 10.4* 10.8*  MG 1.8 1.8 1.8 1.7 1.8  --   --   PHOS  --  3.1 2.5 2.0* 2.4* 3.3  --    GFR Estimated Creatinine Clearance: 24 mL/min (A) (by C-G formula based on SCr of 1.6 mg/dL (H)). Liver Function Tests: Recent Labs  Lab 10/11/24 0319 10/12/24 0214 10/13/24 0404 10/14/24 0251 10/17/24 0248  AST  --   --   --  28 39  ALT  --   --   --  18 19  ALKPHOS  --   --   --  126 135*  BILITOT  --   --   --  0.7 0.7  PROT  --   --   --  6.1* 6.5  ALBUMIN 2.4* 2.2* 2.4* 2.4* 2.7*   No results for input(s): LIPASE, AMYLASE in the last 168 hours. No results  for input(s): AMMONIA in the last 168 hours. Coagulation profile No results for input(s): INR, PROTIME in the last 168 hours.  CBC: Recent Labs  Lab  10/11/24 0319 10/12/24 0214 10/13/24 0404 10/14/24 0251 10/17/24 0248  WBC 3.8* 3.8* 4.4 3.7* 5.8  NEUTROABS  --   --   --  2.0  --   HGB 13.7 13.1 13.2 13.4 13.9  HCT 41.1 41.2 41.1 41.9 43.1  MCV 87.4 89.8 88.0 88.4 89.0  PLT 231 257 279 310 248   Cardiac Enzymes: No results for input(s): CKTOTAL, CKMB, CKMBINDEX, TROPONINI in the last 168 hours. BNP (last 3 results) No results for input(s): PROBNP in the last 8760 hours. CBG: No results for input(s): GLUCAP in the last 168 hours. D-Dimer: No results for input(s): DDIMER in the last 72 hours. Hgb A1c: No results for input(s): HGBA1C in the last 72 hours. Lipid Profile: No results for input(s): CHOL, HDL, LDLCALC, TRIG, CHOLHDL, LDLDIRECT in the last 72 hours. Thyroid  function studies: No results for input(s): TSH, T4TOTAL, T3FREE, THYROIDAB in the last 72 hours.  Invalid input(s): FREET3 Anemia work up: No results for input(s): VITAMINB12, FOLATE, FERRITIN, TIBC, IRON , RETICCTPCT in the last 72 hours. Sepsis Labs: Recent Labs  Lab 10/12/24 0214 10/13/24 0404 10/14/24 0251 10/17/24 0248  PROCALCITON  --  0.55  --   --   WBC 3.8* 4.4 3.7* 5.8    Microbiology Recent Results (from the past 240 hours)  Resp panel by RT-PCR (RSV, Flu A&B, Covid) Anterior Nasal Swab     Status: None   Collection Time: 10/09/24  4:10 PM   Specimen: Anterior Nasal Swab  Result Value Ref Range Status   SARS Coronavirus 2 by RT PCR NEGATIVE NEGATIVE Final   Influenza A by PCR NEGATIVE NEGATIVE Final   Influenza B by PCR NEGATIVE NEGATIVE Final    Comment: (NOTE) The Xpert Xpress SARS-CoV-2/FLU/RSV plus assay is intended as an aid in the diagnosis of influenza from Nasopharyngeal swab specimens and should not be used as a sole basis  for treatment. Nasal washings and aspirates are unacceptable for Xpert Xpress SARS-CoV-2/FLU/RSV testing.  Fact Sheet for Patients: bloggercourse.com  Fact Sheet for Healthcare Providers: seriousbroker.it  This test is not yet approved or cleared by the [place]  FDA and has been authorized for detection and/or diagnosis of SARS-CoV-2 by FDA under an Emergency Use Authorization (EUA). This EUA will remain in effect (meaning this test can be used) for the duration of the COVID-19 declaration under Section 564(b)(1) of the Act, 21 U.S.C. section 360bbb-3(b)(1), unless the authorization is terminated or revoked.     Resp Syncytial Virus by PCR NEGATIVE NEGATIVE Final    Comment: (NOTE) Fact Sheet for Patients: bloggercourse.com  Fact Sheet for Healthcare Providers: seriousbroker.it  This test is not yet approved or cleared by the [place]  FDA and has been authorized for detection and/or diagnosis of SARS-CoV-2 by FDA under an Emergency Use Authorization (EUA). This EUA will remain in effect (meaning this test can be used) for the duration of the COVID-19 declaration under Section 564(b)(1) of the Act, 21 U.S.C. section 360bbb-3(b)(1), unless the authorization is terminated or revoked.  Performed at [facility], [address] [place], [place] [address]   Urine Culture (for pregnant, neutropenic or urologic patients or patients with an indwelling urinary catheter)     Status: Abnormal   Collection Time: 10/09/24  8:44 PM   Specimen: Urine, Clean Catch  Result Value Ref Range Status   Specimen Description URINE, CLEAN CATCH  Final   Special Requests   Final    NONE Performed at [facility], [address] [place], [place] [address]    Culture 30,000 COLONIES/mL PROTEUS MIRABILIS (A)  Final   Report Status 10/11/2024 FINAL  Final   Organism ID, Bacteria  PROTEUS MIRABILIS (A)  Final      Susceptibility   Proteus mirabilis - MIC*    AMPICILLIN <=2 SENSITIVE Sensitive     CEFAZOLIN  (URINE) Value in next row Sensitive      4 SENSITIVEThis is a modified FDA-approved test that has been validated and its performance characteristics determined by the reporting laboratory.  This laboratory is certified under the Clinical Laboratory Improvement Amendments CLIA as qualified to perform high complexity clinical laboratory testing.    CEFEPIME Value in next row Sensitive      4 SENSITIVEThis is a modified FDA-approved test that has been validated and its performance characteristics determined by the reporting laboratory.  This laboratory is certified under the Clinical Laboratory Improvement Amendments CLIA as qualified to perform high complexity clinical laboratory testing.    ERTAPENEM Value in next row Sensitive      4 SENSITIVEThis is a modified FDA-approved test that has been validated and its performance characteristics determined by the reporting laboratory.  This laboratory is certified under the Clinical Laboratory Improvement Amendments CLIA as qualified to perform high complexity clinical laboratory testing.    CEFTRIAXONE  Value in next row Sensitive      4 SENSITIVEThis is a modified FDA-approved test that has been validated and its performance characteristics determined by the reporting laboratory.  This laboratory is certified under the Clinical Laboratory Improvement Amendments CLIA as qualified to perform high complexity clinical laboratory testing.    CIPROFLOXACIN  Value in next row Sensitive      4 SENSITIVEThis is a modified FDA-approved test that has been validated and its performance characteristics determined by the reporting laboratory.  This laboratory is certified under the Clinical Laboratory Improvement Amendments CLIA as qualified to perform high complexity clinical laboratory testing.    GENTAMICIN  Value in next row Sensitive      4  SENSITIVEThis is a modified FDA-approved test that has been validated and its performance characteristics determined by the reporting laboratory.  This laboratory is certified under the Clinical Laboratory Improvement Amendments CLIA as qualified to perform high complexity clinical laboratory testing.    NITROFURANTOIN Value in next row Resistant      4 SENSITIVEThis is a modified FDA-approved test that has been validated and its performance characteristics determined by the reporting laboratory.  This laboratory is certified under the Clinical Laboratory Improvement Amendments CLIA as qualified to perform high complexity clinical laboratory testing.    TRIMETH/SULFA Value in next row Sensitive      4 SENSITIVEThis is a modified FDA-approved test that has been validated and its performance characteristics determined by the reporting laboratory.  This laboratory is certified under the Clinical Laboratory Improvement Amendments CLIA as qualified to perform high complexity clinical laboratory testing.    AMPICILLIN/SULBACTAM Value in next row Sensitive      4 SENSITIVEThis is a modified FDA-approved test that has been validated and its performance characteristics determined by the reporting laboratory.  This laboratory is certified under the Clinical Laboratory Improvement Amendments CLIA as qualified to perform high complexity clinical laboratory testing.    PIP/TAZO Value in next row Sensitive      <=4 SENSITIVEThis is a modified FDA-approved test that has been validated and its performance characteristics determined by the reporting laboratory.  This laboratory is certified under the Clinical Laboratory Improvement Amendments CLIA as  qualified to perform high complexity clinical laboratory testing.    MEROPENEM Value in next row Sensitive      <=4 SENSITIVEThis is a modified FDA-approved test that has been validated and its performance characteristics determined by the reporting laboratory.  This  laboratory is certified under the Clinical Laboratory Improvement Amendments CLIA as qualified to perform high complexity clinical laboratory testing.    * 30,000 COLONIES/mL PROTEUS MIRABILIS  MRSA Next Gen by PCR, Nasal     Status: None   Collection Time: 10/09/24  8:44 PM   Specimen: Urine, Clean Catch; Nasal Swab  Result Value Ref Range Status   MRSA by PCR Next Gen NOT DETECTED NOT DETECTED Final    Comment: (NOTE) The GeneXpert MRSA Assay (FDA approved for NASAL specimens only), is one component of a comprehensive MRSA colonization surveillance program. It is not intended to diagnose MRSA infection nor to guide or monitor treatment for MRSA infections. Test performance is not FDA approved in patients less than 47 years old. Performed at [facility], [address] [place], [place] [address]     Procedures and diagnostic studies:  No results found.             LOS: 7 days   Anni Hocevar  [facility]   Pager on [email or web address]. If 7PM-7AM, please contact night-coverage at [email or web address]     10/17/2024, 1:25 PM

## 2024-10-17 NOTE — Progress Notes (Signed)
 Pt's voice was hoarse, pt requesting throat lozenges. Notified R[place]ore, MD; see new orders.  Lonell LITTIE Lyme, RN

## 2024-10-17 NOTE — Progress Notes (Signed)
 Mobility Specialist Progress Note;   10/17/24 0852  Mobility  Activity Ambulated with assistance  Level of Assistance Minimal assist, patient does 75% or more  Assistive Device Front wheel walker  Distance Ambulated (ft) 20 ft  Activity Response Tolerated well  Mobility Referral Yes  Mobility visit 1 Mobility  Mobility Specialist Start Time (ACUTE ONLY) [id number]  Mobility Specialist Stop Time (ACUTE ONLY) 0907  Mobility Specialist Time Calculation (min) (ACUTE ONLY) 15 min   Pt agreeable to mobility. Required MinA for all mobility. Pt still experiencing L knee buckle w/ 1x LOB requiring MinA to recover. VSS throughout. Pt returned back to bed and left with all needs met, alarm on. RN present.   Lauraine Erm Mobility Specialist Please contact via SecureChat or [employer] [phone]

## 2024-10-17 NOTE — Plan of Care (Signed)
   Problem: Health Behavior/Discharge Planning: Goal: Ability to manage health-related needs will improve Outcome: Progressing   Problem: Clinical Measurements: Goal: Ability to maintain clinical measurements within normal limits will improve Outcome: Progressing   Problem: Clinical Measurements: Goal: Will remain free from infection Outcome: Progressing

## 2024-10-18 ENCOUNTER — Encounter (HOSPITAL_BASED_OUTPATIENT_CLINIC_OR_DEPARTMENT_OTHER): Payer: Self-pay

## 2024-10-18 ENCOUNTER — Ambulatory Visit (HOSPITAL_BASED_OUTPATIENT_CLINIC_OR_DEPARTMENT_OTHER)

## 2024-10-18 NOTE — Progress Notes (Deleted)
 @Patient  ID: Barbara [name], female    DOB: 1941-07-13, 83 y.o.   MRN: [id number]  No chief complaint on file.   Referring provider: Johnny Garnette LABOR, MD  HPI: Discussed the use of AI scribe software for clinical note transcription with the patient, who gave verbal consent to proceed.  History of Present Illness     Last OV 07/26/2024: Agnes Brightbill is an 83 y/o female with PMH of HTN, CAD, allergic rhinitis, PAF, chronic heart failure, GERD, and OSA who presents as a new patient evaluation for sleep.  She reports that she is not new to sleep apnea.  She had a sleep test completed in 2019 which showed an AHI of 52.2 with O2 sat nadir of 77%.  Best tested CPAP pressure was 11 cm H2O with a ResMed AirFit F20 size medium.  Since then she has transition to nasal pillows.  Today she reports that she is here because she is having issues with mask and water  in the mask.  A compliance download was reviewed and demonstrates that she has worn it 1 out of the last 30 days with a residual AHI just over 7.  Notably she does have an acceptable leak profile and she has been using it for about 6 hours when in use.  She does report benefit from the machine when she is not having issues with water  in the tubing.  She reports that she has tried it without using the humidifier but that dries out her mouth.  She has not attempted to adjust the humidity setting in the past.   TEST/EVENTS : Split-night study with AHI 52.2/h and O2 sat nadir 77% with best tested CPAP at 11 cm H2O.  Allergies[1]  Immunization History  Administered Date(s) Administered   Fluad Quad(high Dose 65+) 08/04/2019, 10/31/2020, 08/27/2021   INFLUENZA, HIGH DOSE SEASONAL PF 08/02/2015, 09/04/2016, 08/14/2017, 08/03/2018, 10/05/2024   Influenza Split 10/25/2011, 10/21/2012   Influenza Whole 10/10/2009, 10/17/2010   Influenza,inj,Quad PF,6+ Mos 07/12/2013, 08/24/2014   Influenza-Unspecified 08/04/2014, 08/07/2015    PFIZER(Purple Top)SARS-COV-2 Vaccination 12/26/2019, 01/19/2020, 03/05/2021   Pneumococcal Conjugate-13 09/04/2015   Pneumococcal Polysaccharide-23 10/10/2009   Pneumococcal-Unspecified 08/07/2015   Tdap 11/06/2017    Past Medical History:  Diagnosis Date   A-fib [facility])    Allergy    CAD (coronary artery disease)    sees Dr. Debby Sor  cardiac stents - 2000   CHF (congestive heart failure) (HCC)    Colon polyps    Complication of anesthesia    rash/hives with caines   Dyspnea    02/12/18  when my heart gets out of rhythm, it has not been out of rhythym- since I have been on Tikosyn  (11/2017)   Dysrhythmia    afib fib   GERD (gastroesophageal reflux disease)    takes OTC- Omeprazole - prn   Heart murmur    History of kidney stones    History of stress test    show normal perfusion without scar or ischemia, post EF 68%   Hx of echocardiogram    show an EF 55%-60% range with grade 1 diastolic dysfunction, she had mitral anular calcification with mild MR, moderate LA dilation and mild pulmonary hypertension with a PA estimated pressure of 39mm   Hyperlipidemia    Hypertension    NSTEMI (non-ST elevated myocardial infarction) (HCC)    Osteoarthritis    Pacemaker    Pneumonia    hx of 2015    PONV (postoperative nausea and vomiting)  Sleep apnea     Tobacco History: Tobacco Use History[2] Counseling given: Not Answered   Facility-Administered Medications Prior to Visit  Medication Dose Route Frequency Provider Last Rate Last Admin   (feeding supplement) PROSource Plus liquid 30 mL  30 mL Oral BID BM Verlin Lonni BIRCH, MD   30 mL at 10/18/24 0908   acetaminophen  (TYLENOL ) tablet 650 mg  650 mg Oral Q6H PRN Verlin Lonni BIRCH, MD   650 mg at 10/11/24 1947   Or   acetaminophen  (TYLENOL ) suppository 650 mg  650 mg Rectal Q6H PRN Verlin Lonni BIRCH, MD       albuterol  (PROVENTIL ) (2.5 MG/3ML) 0.083% nebulizer solution 2.5 mg  2.5 mg Nebulization Q2H PRN  Verlin Lonni BIRCH, MD       alum & mag hydroxide-simeth (MAALOX/MYLANTA) 200-200-20 MG/5ML suspension 30 mL  30 mL Oral Q4H PRN Verlin Lonni BIRCH, MD   30 mL at 10/16/24 2004   amiodarone  (PACERONE ) tablet 200 mg  200 mg Oral Daily Verlin Lonni BIRCH, MD   200 mg at 10/18/24 0908   apixaban  (ELIQUIS ) tablet 2.5 mg  2.5 mg Oral BID Pham, Minh Q, RPH-CPP   2.5 mg at 10/18/24 9091   ascorbic acid  (VITAMIN C ) tablet 500 mg  500 mg Oral BID Verlin Lonni BIRCH, MD   500 mg at 10/18/24 9091   atorvastatin  (LIPITOR) tablet 40 mg  40 mg Oral Daily Kate Lonni CROME, MD   40 mg at 10/18/24 9091   empagliflozin  (JARDIANCE ) tablet 10 mg  10 mg Oral Daily Verlin Lonni BIRCH, MD   10 mg at 10/18/24 0908   feeding supplement (ENSURE PLUS HIGH PROTEIN) liquid 237 mL  237 mL Oral BID BM Verlin Lonni BIRCH, MD   237 mL at 10/18/24 9090   furosemide  (LASIX ) tablet 40 mg  40 mg Oral Daily Perri DELENA Meliton Mickey., MD   40 mg at 10/18/24 9091   isosorbide  mononitrate (IMDUR ) 24 hr tablet 30 mg  30 mg Oral Daily Rathore, Vasundhra, MD   30 mg at 10/18/24 9091   loratadine  (CLARITIN ) tablet 10 mg  10 mg Oral Daily PRN Verlin Lonni BIRCH, MD       menthol  (CEPACOL) lozenge 3 mg  1 lozenge Oral PRN Alfornia Madison, MD       metoprolol  tartrate (LOPRESSOR ) tablet 25 mg  25 mg Oral BID Verlin Lonni BIRCH, MD   25 mg at 10/18/24 0908   multivitamin with minerals tablet 1 tablet  1 tablet Oral Daily Verlin Lonni BIRCH, MD   1 tablet at 10/18/24 0908   ondansetron  (ZOFRAN ) tablet 4 mg  4 mg Oral Q6H PRN Verlin Lonni BIRCH, MD       Or   ondansetron  (ZOFRAN ) injection 4 mg  4 mg Intravenous Q6H PRN Verlin Lonni BIRCH, MD   4 mg at 10/13/24 9178   pantoprazole  (PROTONIX ) EC tablet 40 mg  40 mg Oral Daily Verlin Lonni BIRCH, MD   40 mg at 10/18/24 0908   polyethylene glycol (MIRALAX  / GLYCOLAX ) packet 17 g  17 g Oral BID Perri DELENA Meliton Mickey., MD   17 g at  10/17/24 9094   senna-docusate (Senokot-S) tablet 1 tablet  1 tablet Oral QHS Perri DELENA Meliton Mickey., MD   1 tablet at 10/17/24 2103   sodium chloride  flush (NS) 0.9 % injection 3 mL  3 mL Intravenous Q12H Verlin Lonni BIRCH, MD   3 mL at 10/18/24 0909   sodium chloride  flush (NS)  0.9 % injection 3 mL  3 mL Intravenous PRN Verlin Lonni BIRCH, MD       sodium chloride  flush (NS) 0.9 % injection 3 mL  3 mL Intravenous Q12H Verlin Lonni BIRCH, MD   3 mL at 10/18/24 9090   sodium chloride  flush (NS) 0.9 % injection 3 mL  3 mL Intravenous PRN Verlin Lonni BIRCH, MD       thiamine  (VITAMIN B1) tablet 100 mg  100 mg Oral Daily Perri DELENA Meliton Mickey., MD   100 mg at 10/18/24 9091   zinc  sulfate (50mg  elemental zinc ) capsule 220 mg  220 mg Oral Daily Verlin Lonni BIRCH, MD   220 mg at 10/18/24 0908   Outpatient Medications Prior to Visit  Medication Sig Dispense Refill   acetaminophen  (TYLENOL ) 325 MG tablet Take 2 tablets (650 mg total) by mouth every 6 (six) hours as needed for mild pain (pain score 1-3) or fever (or Fever >/= 101).     alum & mag hydroxide-simeth (MAALOX/MYLANTA) 200-200-20 MG/5ML suspension Take 30 mLs by mouth every 4 (four) hours as needed for indigestion. (Patient not taking: Reported on 10/10/2024) 355 mL 0   amiodarone  (PACERONE ) 200 MG tablet Take 1 tablet (200 mg total) by mouth daily. 90 tablet 3   apixaban  (ELIQUIS ) 2.5 MG TABS tablet Take 1 tablet (2.5 mg total) by mouth 2 (two) times daily. 180 tablet 3   cyanocobalamin  (VITAMIN B12) 500 MCG tablet Take 500 mcg by mouth daily.     empagliflozin  (JARDIANCE ) 10 MG TABS tablet Take 1 tablet (10 mg total) by mouth daily. 90 tablet 3   ipratropium (ATROVENT ) 0.06 % nasal spray Place 2 sprays into both nostrils 2 (two) times daily as needed (allergies).  5   isosorbide  mononitrate (IMDUR ) 30 MG 24 hr tablet Take 1 tablet (30 mg total) by mouth daily. 90 tablet 3   ketoconazole  (NIZORAL ) 2 % cream Apply 1  Application topically 2 (two) times daily as needed for irritation. 30 g 5   loratadine  (CLARITIN ) 10 MG tablet Take 10 mg by mouth daily as needed for allergies.     losartan  (COZAAR ) 50 MG tablet Take 1 tablet (50 mg total) by mouth daily. 30 tablet 5   megestrol  (MEGACE ) 40 MG tablet Take 1 tablet (40 mg total) by mouth in the morning. 30 tablet 5   metoprolol  tartrate (LOPRESSOR ) 25 MG tablet Take 1 tablet (25 mg total) by mouth 2 (two) times daily. 60 tablet 11   mirtazapine  (REMERON ) 15 MG tablet Take 1 tablet (15 mg total) by mouth at bedtime. 30 tablet 5   Multiple Vitamin (MULTIVITAMIN WITH MINERALS) TABS tablet Take 1 tablet by mouth daily. 30 tablet 0   nitroGLYCERIN  (NITROSTAT ) 0.4 MG SL tablet Place 1 tablet (0.4 mg total) under the tongue every 5 (five) minutes as needed for chest pain. 60 tablet 5   Nutritional Supplements (BOOST/FIBER PO) Take 1 Bottle by mouth 3 (three) times daily.     pantoprazole  (PROTONIX ) 40 MG tablet Take 1 tablet (40 mg total) by mouth daily. 90 tablet 3   senna-docusate (SENOKOT-S) 8.6-50 MG tablet Take 1 tablet by mouth 2 (two) times daily. (Patient taking differently: Take 1 tablet by mouth daily.)     sucralfate  (CARAFATE ) 1 GM/10ML suspension Take 10 mLs (1 g total) by mouth 2 (two) times daily. (Patient not taking: Reported on 10/10/2024) 420 mL 0   triamcinolone  (NASACORT) 55 MCG/ACT nasal inhaler Place 1 spray into both nostrils daily  as needed (for allergies).        Review of Systems:   Constitutional:   No  weight loss, night sweats,  Fevers, chills, fatigue, or  lassitude.  HEENT:   No headaches,  Difficulty swallowing,  Tooth/dental problems, or  Sore throat,                No sneezing, itching, ear ache, nasal congestion, post nasal drip,   CV:  No chest pain,  Orthopnea, PND, swelling in lower extremities, anasarca, dizziness, palpitations, syncope.   GI  No heartburn, indigestion, abdominal pain, nausea, vomiting, diarrhea, change in  bowel habits, loss of appetite, bloody stools.   Resp: No shortness of breath with exertion or at rest.  No excess mucus, no productive cough,  No non-productive cough,  No coughing up of blood.  No change in color of mucus.  No wheezing.  No chest wall deformity  Skin: no rash or lesions.  GU: no dysuria, change in color of urine, no urgency or frequency.  No flank pain, no hematuria   MS:  No joint pain or swelling.  No decreased range of motion.  No back pain.    Physical Exam  There were no vitals taken for this visit.  GEN: A/Ox3; pleasant , NAD, well nourished    HEENT:  [place]/AT,  EACs-clear, TMs-wnl, NOSE-clear, THROAT-clear, no lesions, no postnasal drip or exudate noted.   NECK:  Supple w/ fair ROM; no JVD; normal carotid impulses w/o bruits; no thyromegaly or nodules palpated; no lymphadenopathy.    RESP  Clear  P & A; w/o, wheezes/ rales/ or rhonchi. no accessory muscle use, no dullness to percussion  CARD:  RRR, no m/r/g, no peripheral edema, pulses intact, no cyanosis or clubbing.  GI:   Soft & nt; nml bowel sounds; no organomegaly or masses detected.   Musco: Warm bil, no deformities or joint swelling noted.   Neuro: alert, no focal deficits noted.    Skin: Warm, no lesions or rashes    Lab Results:  CBC    Component Value Date/Time   WBC 5.8 10/17/2024 0248   RBC 4.84 10/17/2024 0248   HGB 13.9 10/17/2024 0248   HGB 11.1 04/26/2024 1240   HCT 43.1 10/17/2024 0248   HCT 36.2 04/26/2024 1240   PLT 248 10/17/2024 0248   PLT 138 (L) 04/26/2024 1240   MCV 89.0 10/17/2024 0248   MCV 89 04/26/2024 1240   MCH 28.7 10/17/2024 0248   MCHC 32.3 10/17/2024 0248   RDW 17.4 (H) 10/17/2024 0248   RDW 18.7 (H) 04/26/2024 1240   LYMPHSABS 0.9 10/14/2024 0251   LYMPHSABS 0.5 (L) 04/26/2024 1240   MONOABS 0.4 10/14/2024 0251   EOSABS 0.2 10/14/2024 0251   EOSABS 0.1 04/26/2024 1240   BASOSABS 0.0 10/14/2024 0251   BASOSABS 0.0 04/26/2024 1240    BMET     Component Value Date/Time   NA 139 10/17/2024 0248   NA 139 04/26/2024 1240   K 4.7 10/17/2024 0248   CL 102 10/17/2024 0248   CO2 26 10/17/2024 0248   GLUCOSE 93 10/17/2024 0248   BUN 40 (H) 10/17/2024 0248   BUN 28 (H) 04/26/2024 1240   CREATININE 1.60 (H) 10/17/2024 0248   CREATININE 1.30 (H) 06/18/2024 1545   CALCIUM  10.8 (H) 10/17/2024 0248   GFRNONAA 31 (L) 10/17/2024 0248   GFRNONAA 35 (L) 04/05/2024 1032   GFRAA 44 (L) 09/20/2020 1453    BNP    Component Value  Date/Time   BNP 960.0 (H) 10/13/2024 0404    ProBNP No results found for: PROBNP  Imaging: DG CHEST PORT 1 VIEW Result Date: 10/13/2024 EXAM: 1 VIEW(S) XRAY OF THE CHEST 10/13/2024 02:03:44 AM COMPARISON: 10/09/2024 CLINICAL HISTORY: Rales. FINDINGS: LINES, TUBES AND DEVICES: Left subclavian pacemaker. LUNGS AND PLEURA: Mild patchy right upper and left lower lobe opacities, suspicious for pneumonia. No pleural effusion. No pneumothorax. HEART AND MEDIASTINUM: Left subclavian pacemaker. No acute abnormality of the cardiac and mediastinal silhouettes. BONES AND SOFT TISSUES: Right shoulder arthroplasty. No acute osseous abnormality. IMPRESSION: 1. Mild patchy right upper and left lower lobe opacities, suspicious for pneumonia. Electronically signed by: Pinkie Pebbles MD 10/13/2024 02:07 AM EST RP Workstation: [id number]   CARDIAC CATHETERIZATION Result Date: 10/11/2024   Dist LM lesion is 15% stenosed.   Prox Cx lesion is 40% stenosed.   Prox RCA to Mid RCA lesion is 30% stenosed.   Ost LAD to Prox LAD lesion is 30% stenosed. Patent proximal LAD stent with minimal restenosis Mild non-obstructive disease in the mid Circumflex at the ostium of the obtuse marginal branch Mild non-obstructive disease in the mid RCA LVEDP 8 mmHg Recommendations: Continue medical management of CAD   ECHOCARDIOGRAM COMPLETE Result Date: 10/10/2024    ECHOCARDIOGRAM REPORT   Patient Name:   SHAINE MOUNT Date of Exam: 10/10/2024  Medical Rec #:  [id number]              Height:       68.0 in Accession #:    [id number]             Weight:       132.5 lb Date of Birth:  1941-05-31              BSA:          1.716 m Patient Age:    83 years               BP:           132/48 mmHg Patient Gender: F                      HR:           60 bpm. Exam Location:  Inpatient Procedure: 2D Echo, Color Doppler, Cardiac Doppler and Intracardiac            Opacification Agent (Both Spectral and Color Flow Doppler were            utilized during procedure). Indications:    CHF Acute Diastolic I50.31  History:        Patient has prior history of Echocardiogram examinations, most                 recent 05/18/2024.  Sonographer:    Tinnie Gosling RDCS Referring Phys: ROLLO JONELLE LOUDER IMPRESSIONS  1. Small spade like ventricular cavity. 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 Left ventricular ejection fraction, by estimation, is 60 to 65%. The left ventricle has normal function. The left ventricle has no regional wall motion abnormalities. There is mild left ventricular hypertrophy. Left ventricular diastolic parameters are consistent with Grade II diastolic dysfunction (pseudonormalization). Elevated left ventricular end-diastolic pressure.  2. Device leads in RA/RV. Right ventricular systolic function is normal. The right ventricular size is normal.  3. Left atrial size was moderately dilated.  4. The mitral valve is abnormal.  Mild mitral valve regurgitation. No evidence of mitral stenosis.  5. Calcified non coronary cusp. The aortic valve is tricuspid. There is moderate calcification of the aortic valve. There is moderate thickening of the aortic valve. Aortic valve regurgitation is not visualized. Aortic valve sclerosis is present, with no evidence of aortic valve stenosis.  6. The inferior vena cava is normal in size with greater than 50%  respiratory variability, suggesting right atrial pressure of 3 mmHg. FINDINGS  Left Ventricle: Small spade like ventricular cavity. 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 Left ventricular ejection fraction, by  estimation, is 60 to 65%. The left ventricle has normal function. The left ventricle has no regional wall motion abnormalities. Strain was performed and the global longitudinal strain is indeterminate. The left ventricular internal cavity size was normal in size. There is mild left ventricular hypertrophy. Left ventricular diastolic parameters are consistent with Grade II diastolic dysfunction (pseudonormalization). Elevated left ventricular end-diastolic pressure. Right Ventricle: Device leads in RA/RV. The right ventricular size is normal. No increase in right ventricular wall thickness. Right ventricular systolic function is normal. Left Atrium: Left atrial size was moderately dilated. Right Atrium: Right atrial size was normal in size. Pericardium: There is no evidence of pericardial effusion. Mitral Valve: The mitral valve is abnormal. There is mild thickening of the mitral valve leaflet(s). There is mild calcification of the mitral valve leaflet(s). Mild mitral valve regurgitation. No evidence of mitral valve stenosis. Tricuspid Valve: The tricuspid valve is normal in structure. Tricuspid valve regurgitation is mild . No evidence of tricuspid stenosis. Aortic Valve: Calcified non coronary cusp. The aortic valve is tricuspid. There is moderate calcification of the aortic valve. There is moderate thickening of the aortic valve. Aortic valve regurgitation is not visualized. Aortic valve sclerosis is present, with no evidence of aortic valve stenosis. Pulmonic Valve: The pulmonic valve was normal in structure. Pulmonic valve regurgitation is not visualized. No evidence of pulmonic  stenosis. Aorta: The aortic root is normal in size and structure. Venous: The inferior vena cava is normal in size with greater than 50% respiratory variability, suggesting right atrial pressure of 3 mmHg. IAS/Shunts: No atrial level shunt detected by color flow Doppler. Additional Comments: 3D was performed not requiring image post processing on an independent workstation and was indeterminate.  LEFT VENTRICLE PLAX 2D LVIDd:         4.10 cm   Diastology LVIDs:         2.70 cm   LV e' medial:    4.68 cm/s LV PW:         1.20 cm   LV E/e' medial:  18.4 LV IVS:        1.20 cm   LV e' lateral:   6.42 cm/s LVOT diam:     1.60 cm   LV E/e' lateral: 13.4 LV SV:         50 LV SV Index:   29 LVOT Area:     2.01 cm LV IVRT:       113 msec  RIGHT VENTRICLE             IVC RV S prime:     12.40 cm/s  IVC diam: 1.10 cm LEFT ATRIUM             Index        RIGHT ATRIUM           Index  LA diam:        4.30 cm 2.51 cm/m   RA Area:     13.10 cm LA Vol (A2C):   75.1 ml 43.77 ml/m  RA Volume:   31.80 ml  18.54 ml/m LA Vol (A4C):   86.3 ml 50.30 ml/m LA Biplane Vol: 80.4 ml 46.86 ml/m  AORTIC VALVE LVOT Vmax:   138.00 cm/s LVOT Vmean:  94.500 cm/s LVOT VTI:    0.249 m  AORTA Ao Root diam: 2.30 cm Ao Asc diam:  2.80 cm MITRAL VALVE MV Area (PHT): 2.46 cm    SHUNTS MV Decel Time: 308 msec    Systemic VTI:  0.25 m MV E velocity: 85.90 cm/s  Systemic Diam: 1.60 cm MV A velocity: 44.50 cm/s MV E/A ratio:  1.93 Maude Emmer MD Electronically signed by Maude Emmer MD Signature Date/Time: 10/10/2024/3:54:14 PM    Final    DG Lumbar Spine Complete Result Date: 10/09/2024 CLINICAL DATA:  Low back pain following fall. EXAM: LUMBAR SPINE - COMPLETE 4+ VIEW COMPARISON:  None Available. FINDINGS: There is no evidence of acute lumbar spine fracture. Alignment is normal. Multilevel intervertebral disc space narrowing, degenerative endplate changes, and facet arthropathy are noted. There is atherosclerotic calcification of the aorta. Total  hip arthroplasty changes are noted on the left. 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 Electronically Signed   By: Leita Birmingham M.D.   On: 10/09/2024 16:53   DG Chest 2 View Result Date: 10/09/2024 CLINICAL DATA:  Low back pain after fall.  Chest pain. EXAM: CHEST - 2 VIEW, LUMBAR SPINE COMPARISON:  06/20/2024, 07/03/2024. FINDINGS: Chest: The heart is enlarged and the mediastinal contour is within normal limits. A dual lead pacemaker is present over the left chest. There is eventration of the posterior left diaphragm with a associated atelectasis. No consolidation, effusion, or pneumothorax is seen. Degenerative changes are present in the thoracic spine. Shoulder arthroplasty changes are noted on the right. Lumbar spine: There is no evidence of acute fracture in the lumbar spine. Alignment appears normal. Multilevel intervertebral disc space narrowing, degenerative endplate changes, and facet arthropathy are noted. There is atherosclerotic calcification of the aorta. 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 IMPRESSION: 1. No active cardiopulmonary disease. 2. No acute fracture or subluxation in the lumbar spine. Electronically Signed   By: Leita Birmingham M.D.   On: 10/09/2024 15:18    Administration History     None           No data to display          No results found for: NITRICOXIDE   Assessment & Plan:   Assessment & Plan    No follow-ups on file.  Candis Dandy, PA-C 10/18/2024      [1]  Allergies Allergen Reactions   Lidocaine  Anaphylaxis   Morphine  Other (See Comments)    Body shuts down   Procaine Hcl Anaphylaxis, Rash and Other (See Comments)    Anything with 'caine' in it    Sulfonamide Derivatives Hives   Amlodipine  Swelling   Ezetimibe-Simvastatin Other (See Comments)    VYTORIN = Joint pain   Latex Itching and Other (See Comments)    WELTS   Norco  [Hydrocodone -Acetaminophen ] Nausea And Vomiting and Other (See Comments)    States does ok with IV form    Tape Itching and Other (See Comments)    Patient prefers either paper tape or Coban wrap   Tramadol   Nausea And Vomiting  [2]  Social History Tobacco Use  Smoking Status Former   Current packs/day: 0.00   Types: Cigarettes   Start date: 11/04/1956   Quit date: 11/04/1996   Years since quitting: 27.9  Smokeless Tobacco Never

## 2024-10-18 NOTE — Plan of Care (Signed)
   Problem: Activity: Goal: Risk for activity intolerance will decrease Outcome: Progressing   Problem: Nutrition: Goal: Adequate nutrition will be maintained Outcome: Progressing   Problem: Coping: Goal: Level of anxiety will decrease Outcome: Progressing   Problem: Elimination: Goal: Will not experience complications related to bowel motility Outcome: Progressing Goal: Will not experience complications related to urinary retention Outcome: Progressing

## 2024-10-18 NOTE — Plan of Care (Signed)

## 2024-10-18 NOTE — Progress Notes (Signed)
 Occupational Therapy Treatment Patient Details Name: Barbara [name] MRN: [id number] DOB: 03/04/1941 Today's Date: 10/18/2024   History of present illness 83 yo female adm 12/6 with SOB and chest pain. Admitted for NSTEMI and acute CHF exacerbation. PMH includes: CAD, HFpEF, A-fib s/p PPM, essential HTN, HLD, anxiety, CKD, anemia, Rt TSA, vertigo, Lt femur fx   OT comments  Pt progressing towards goals, improved activity/standing tolerance today as pt able to stand x10 min for grooming task and then ambulate to bathroom for BM during session. Pt currently needs min-max A for ADLs, mod A for bed mobility, and mod A for transfers with RW. Pt with mild BLE buckling during session, overall needs min A in standing for balance, especially with static standing/unilateral UE support. Pt presenting with impairments listed below, will follow acutely. Patient will benefit from continued inpatient follow up therapy, <3 hours/day to maximize safety/ind with ADL/functional mobility.       If plan is discharge home, recommend the following:  A little help with walking and/or transfers;A lot of help with bathing/dressing/bathroom;Assistance with cooking/housework;Direct supervision/assist for medications management;Direct supervision/assist for financial management;Assist for transportation;Help with stairs or ramp for entrance   Equipment Recommendations  Other (comment) (defer)    Recommendations for Other Services PT consult    Precautions / Restrictions Precautions Precautions: Fall Recall of Precautions/Restrictions: Intact Restrictions Weight Bearing Restrictions Per Provider Order: No       Mobility Bed Mobility Overal bed mobility: Needs Assistance Bed Mobility: Supine to Sit, Sit to Supine     Supine to sit: Mod assist Sit to supine: Mod assist   General bed mobility comments: mod A to elevate trunk and for BLE mgmt    Transfers Overall transfer level: Needs  assistance Equipment used: Rolling walker (2 wheels) Transfers: Sit to/from Stand Sit to Stand: Mod assist           General transfer comment: mod A from slightly elevated bed height     Balance Overall balance assessment: Needs assistance Sitting-balance support: No upper extremity supported, Feet supported Sitting balance-Leahy Scale: Good     Standing balance support: Bilateral upper extremity supported, During functional activity Standing balance-Leahy Scale: Poor Standing balance comment: reliant on RW support                           ADL either performed or assessed with clinical judgement   ADL Overall ADL's : Needs assistance/impaired     Grooming: Minimal assistance;Standing;Oral care                   Toilet Transfer: Minimal assistance;Ambulation;Regular Toilet;BSC/3in1;Rolling walker (2 wheels)   Toileting- Clothing Manipulation and Hygiene: Maximal assistance       Functional mobility during ADLs: Minimal assistance;Rolling walker (2 wheels)      Extremity/Trunk Assessment Upper Extremity Assessment Upper Extremity Assessment: Generalized weakness   Lower Extremity Assessment Lower Extremity Assessment: Defer to PT evaluation        Vision   Vision Assessment?: No apparent visual deficits   Perception Perception Perception: Not tested   Praxis Praxis Praxis: Not tested   Communication Communication Communication: No apparent difficulties   Cognition Arousal: Alert Behavior During Therapy: WFL for tasks assessed/performed Cognition: No family/caregiver present to determine baseline                               Following commands: Impaired Following  commands impaired: Follows one step commands with increased time      Cueing   Cueing Techniques: Verbal cues  Exercises      Shoulder Instructions       General Comments VSS on RA    Pertinent Vitals/ Pain       Pain Assessment Pain Assessment:  No/denies pain  Home Living                                          Prior Functioning/Environment              Frequency  Min 2X/week        Progress Toward Goals  OT Goals(current goals can now be found in the care plan section)  Progress towards OT goals: Progressing toward goals     Plan      Co-evaluation                 AM-PAC OT 6 Clicks Daily Activity     Outcome Measure   Help from another person eating meals?: A Little Help from another person taking care of personal grooming?: A Little Help from another person toileting, which includes using toliet, bedpan, or urinal?: A Lot Help from another person bathing (including washing, rinsing, drying)?: A Lot Help from another person to put on and taking off regular upper body clothing?: A Little Help from another person to put on and taking off regular lower body clothing?: A Lot 6 Click Score: 15    End of Session Equipment Utilized During Treatment: Rolling walker (2 wheels);Gait belt  OT Visit Diagnosis: Unsteadiness on feet (R26.81);Other abnormalities of gait and mobility (R26.89);Muscle weakness (generalized) (M62.81);History of falling (Z91.81)   Activity Tolerance Patient tolerated treatment well   Patient Left in bed;with call bell/phone within reach;with bed alarm set   Nurse Communication Mobility status        Time: 9168-9093 OT Time Calculation (min): 35 min  Charges: OT General Charges $OT Visit: 1 Visit OT Treatments $Self Care/Home Management : 23-37 mins  Donnelle Olmeda K, OTD, OTR/L SecureChat [facility] [phone]   Lehua Flores K Koonce 10/18/2024, 9:20 AM

## 2024-10-18 NOTE — Progress Notes (Signed)
 Nutrition Follow-up  DOCUMENTATION CODES:   Not applicable  INTERVENTION:  -Continue Ensure Plus High Protein po BID, each supplement provides 350 kcal and 20 grams of protein  -MVI with minerals daily -Discontinue vitamin C  BID -220 mg zinc  sulfate daily x 14 days -Discontinue 100 mg thiamine  daily as she is out of refeeding window -Continue Prosource Plus BID, each supplement provides 100 kcals and 15 grams protein -Recommend ionized calcium  draw to assess calcium  trend   NUTRITION DIAGNOSIS:  Increased nutrient needs related to chronic illness (CHF, wound healing) as evidenced by estimated needs.  GOAL:  Patient will meet greater than or equal to 90% of their needs  MONITOR:  PO intake, Supplement acceptance  REASON FOR ASSESSMENT:   Consult Assessment of nutrition requirement/status  ASSESSMENT:   83 y.o. female with medical history significant of  CAD, a.fib on eliquis , HTN, HLD, GERD, CKD3a, SSS sp pacemaker admitted for NSTEMI , hypokalemia, and FTT.  Patient medically stable. Insurance denied SNF and appeal. Second appeal now pending. Heart rate now controlled. No issues overnight.   Average Meal Intake 12/14: 80-100%   Met with patient at bedside who reports disliking the food stating it is under seasoned. Intake appears to have improved since admission.  Dicussed importance of adequate intake as it relates to progression with therapy and toward discharge. She verbalizes understanding. Amicable to continue to receive ONS.   24-Hour Recall B: sausage, eggs, toast, and Boost L: sandwich with chips and water  D: protein, starch, veg (prepared by in-home aid) Snacks: Boost (vanilla) x2 daily  No difficulties chewing or swallowing noted. Bowels stable. States she goes once every 1-2 days. Mild edema on exam. Observed with severe muscle and fat deletions despite presence of edema.   Admit Weight: 59 kg Current Weight: 57 kg  Weight stable. Edema has improved.  Continues with mild, pitting edema to BLEs. Of note, fluid status may continue to mask more significant depletions. Patient is at high risk for malnutrition given advanced age, skin breakdown, poor oral intake, and multiple co-morbidities; she would benefit from addition of oral nutrition supplements.    Medications: ascorbic acid , Jardiance , furosemide , MVI, pantoprazole , Miralax , senna-docusate, thiamine , zinc  sulfate  Corrected calcium  elevated. Concerning in presence of CKDIII. Would benefit from ionized calcium  draw to corroborate trend. No new labs today to add to this morning's panel. Recommend adding to panel on next lab draw. Will d/c vitamin C  as this increases risk of kidney stones. High calcium  likely 2/2 immobility, but concerning as this can contribute to heart arrhythmias. BUN/Crt trending up as well.    Labs from 12/14 reviewed:  Na+ 139 (wdl) K+ 4.7 (wdl) Corr Ca 88.1 (H) BUN 40 (H) Crt 1.18>1.55>1.60 (H) CBGs 79-93 x24 hours A1c 5.8 (10/2024)    NUTRITION - FOCUSED PHYSICAL EXAM:  Flowsheet Row Most Recent Value  Orbital Region Moderate depletion  Upper Arm Region Severe depletion  Thoracic and Lumbar Region Severe depletion  Buccal Region Moderate depletion  Temple Region Moderate depletion  Clavicle Bone Region Severe depletion  Clavicle and Acromion Bone Region Severe depletion  Scapular Bone Region Severe depletion  Dorsal Hand Moderate depletion  Patellar Region Severe depletion  Anterior Thigh Region Severe depletion  Posterior Calf Region Severe depletion  Edema (RD Assessment) Mild  Hair Reviewed  Eyes Reviewed  Mouth Reviewed  Skin Reviewed  Nails Reviewed    Diet Order:   Diet Order             Diet Heart  Room service appropriate? Yes; Fluid consistency: Thin  Diet effective now             EDUCATION NEEDS:  Education needs have been addressed  Skin:  Skin Assessment: Skin Integrity Issues: Skin Integrity Issues:: DTI DTI: sacrum  Per  CWCON notes, patient with deep tissue pressure injury to sacrum (dark purple discoloration with one pink dry open wound and scattered tan nonviable tissue present).   Last BM:  12/15  Height:  Ht Readings from Last 1 Encounters:  10/09/24 5' 8 (1.727 m)   Weight:  Wt Readings from Last 1 Encounters:  10/18/24 57 kg   Ideal Body Weight:  63.6 kg  BMI:  Body mass index is 19.11 kg/m.  Estimated Nutritional Needs:   Kcal:  1650-1850  Protein:  80-95 grams  Fluid:  1.6-1.8 L  Blair Deaner MS, RD, LDN Registered Dietitian Clinical Nutrition RD Inpatient Contact Info in Amion

## 2024-10-18 NOTE — TOC Progression Note (Addendum)
 Transition of Care [facility]) - Progression Note    Patient Details  Name: Barbara [name] MRN: [id number] Date of Birth: 1941/07/15  [facility] CM/SW Contact  Lauraine FORBES Saa, LCSWA Phone Number: 10/18/2024, 12:35 PM  Clinical Narrative:     12:35 PM CSW attempted to speak with patient's payor, [employer], to follow up on SNF insurance authorization decision appeal. There was no response and a voicemail was left. CSW will continue to follow.  3:11 PM HTA informed CSW that patient's SNF appeal was denied and has been sent to [facility] for second appeal. CSW made medical team aware.  4:27 PM CSW informed patient of SNF appeal denial. CSW and RNCM informed patient of options of paying privately for SNF or home care. RNCM to follow up with patient's son. CSW contacted [facility] Leadership for further assistance. CSW informed SNF of potential private pay.  5:13 PM TOC Leadership informed TOC that patient will need to discharge home with home resources to be provided by TOC. TOC will continue to follow.  Expected Discharge Plan: Skilled Nursing Facility Barriers to Discharge: Continued Medical Work up, Other (must enter comment) (SNF pending Appeal)               Expected Discharge Plan and Services In-house Referral: Clinical Social Work   Post Acute Care Choice: Skilled Nursing Facility Living arrangements for the past 2 months: Single Family Home                                       Social Drivers of Health (SDOH) Interventions SDOH Screenings   Food Insecurity: No Food Insecurity (10/09/2024)  Housing: High Risk (06/20/2024)  Transportation Needs: Unmet Transportation Needs (06/20/2024)  Utilities: Not At Risk (10/09/2024)  Alcohol Screen: Low Risk (08/05/2022)  Depression (PHQ2-9): Low Risk (04/05/2024)  Financial Resource Strain: Low Risk (08/05/2022)  Physical Activity: Insufficiently Active (07/02/2022)  Social Connections: Moderately Integrated (06/20/2024)   Stress: No Stress Concern Present (07/02/2022)  Tobacco Use: Medium Risk (10/09/2024)    Readmission Risk Interventions    05/24/2024    8:57 AM 03/11/2024    1:07 PM  Readmission Risk Prevention Plan  Transportation Screening Complete Complete  PCP or Specialist Appt within 5-7 Days  Complete  Home Care Screening  Complete  Medication Review (RN CM)  Complete  HRI or Home Care Consult Complete   Social Work Consult for Recovery Care Planning/Counseling Complete   Palliative Care Screening Not Applicable   Medication Review Oceanographer) Complete

## 2024-10-18 NOTE — Progress Notes (Signed)
 Progress Note    Barbara [name]  FMW:[id number] DOB: 12-28-1940  DOA: 10/09/2024 PCP: Johnny Garnette LABOR, MD      Brief Narrative:    Medical records reviewed and are as summarized below:  Barbara [name] is a 83 y.o. female medical history significant of CAD, a.fib on eliquis , HTN, HLD, GERD, CKD3a, SSS sp pacemaker, who presented to the hospital with chest pain.      Assessment/Plan:   Principal Problem:   Unstable angina (HCC) Active Problems:   NSTEMI (non-ST elevated myocardial infarction) (HCC)   Normocytic anemia   CAD (coronary artery disease/elevated troponin   PAF (paroxysmal atrial fibrillation) (HCC)   Primary hypertension   Chronic kidney disease, stage 3b (HCC)   Hyperlipidemia   Gastroesophageal reflux disease   Tachycardia-bradycardia syndrome (HCC)   Failure to thrive in adult   Acute on chronic diastolic heart failure (HCC)   Hypokalemia   Moderate protein malnutrition   Night sweats   Nutrition Problem: Increased nutrient needs Etiology: chronic illness (CHF, wound healing)  Signs/Symptoms: estimated needs   Body mass index is 19.11 kg/m.   Chest pain, mildly elevated troponins: Peak troponin 805. S/p treatment with IV heparin  drip.  She was seen by the cardiologist. S/p left heart cath with patent proximal LAD stent with minimal restenosis. 2D echo did not show wall motion abnormality. Medical management was recommended.  Continue aspirin , Lipitor and metoprolol .   Acute on chronic diastolic CHF: She feels much better.  S/p treatment with IV Lasix . Continue oral Lasix . BNP 960. 2D echo showed EF estimated at 60 to 65%, apical hypertrophic cardiomyopathy, grade 2 diastolic dysfunction, mild MR, moderate left atrial enlargement.   Atrial fibrillation: Heart rate controlled.  Continue amiodarone , metoprolol  and Eliquis .     Hypophosphatemia: Improved Mildly elevated calcium  level probably in the setting of Carafate   use.   Comorbidities include CKD stage IIIb, CAD, tachybradycardia syndrome s/p permanent pacemaker placement   PT recommended discharge to SNF.  However, insurance denied authorization to SNF.  Patient has appointed an appeal against this decision.  Result of appeal is still pending.   Diet Order             Diet Heart Room service appropriate? Yes; Fluid consistency: Thin  Diet effective now                                  Consultants: Cardiologist  Procedures: None    Medications:    (feeding supplement) PROSource Plus  30 mL Oral BID BM   amiodarone   200 mg Oral Daily   apixaban   2.5 mg Oral BID   ascorbic acid   500 mg Oral BID   atorvastatin   40 mg Oral Daily   empagliflozin   10 mg Oral Daily   feeding supplement  237 mL Oral BID BM   furosemide   40 mg Oral Daily   isosorbide  mononitrate  30 mg Oral Daily   metoprolol  tartrate  25 mg Oral BID   multivitamin with minerals  1 tablet Oral Daily   pantoprazole   40 mg Oral Daily   polyethylene glycol  17 g Oral BID   senna-docusate  1 tablet Oral QHS   sodium chloride  flush  3 mL Intravenous Q12H   sodium chloride  flush  3 mL Intravenous Q12H   thiamine   100 mg Oral Daily   zinc  sulfate (50mg  elemental zinc )  220 mg  Oral Daily   Continuous Infusions:   Anti-infectives (From admission, onward)    None              Family Communication/Anticipated D/C date and plan/Code Status   DVT prophylaxis: apixaban  (ELIQUIS ) tablet 2.5 mg Start: 10/12/24 0800 apixaban  (ELIQUIS ) tablet 2.5 mg     Code Status: Full Code  Family Communication: None Disposition Plan: Plan to discharge to SNF   Status is: Inpatient Remains inpatient appropriate because: Awaiting placement to SNF       Subjective:   She has no complaints.  No palpitations, shortness of breath or chest pain  Objective:    Vitals:   10/17/24 2301 10/18/24 0412 10/18/24 0718 10/18/24 1133  BP: 129/72 (!) 140/65  123/67 127/74  Pulse: 60 60 61 60  Resp: 15 19 (!) 23 (!) 21  Temp: 98.4 F (36.9 C) 98.7 F (37.1 C) 97.9 F (36.6 C) 97.8 F (36.6 C)  TempSrc: Oral Oral Oral Oral  SpO2: 97% 95% 93% 92%  Weight:  57 kg    Height:       No data found.   Intake/Output Summary (Last 24 hours) at 10/18/2024 1300 Last data filed at 10/18/2024 0414 Gross per 24 hour  Intake --  Output 1200 ml  Net -1200 ml   Filed Weights   10/16/24 0504 10/17/24 0500 10/18/24 0412  Weight: 58.3 kg 59.2 kg 57 kg    Exam:  GEN: NAD SKIN: Warm and dry EYES: No pallor or icterus ENT: MMM CV: RRR PULM: CTA B ABD: soft, ND, NT, +BS CNS: AAO x 3, non focal EXT: No edema or tenderness     Data Reviewed:   I have personally reviewed following labs and imaging studies:  Labs: Labs show the following:   Basic Metabolic Panel: Recent Labs  Lab 10/12/24 0214 10/13/24 0404 10/14/24 0251 10/15/24 0223 10/17/24 0248  NA 139 138 136 138 139  K 4.2 3.9 3.9 4.5 4.7  CL 108 108 102 102 102  CO2 23 23 28 27 26   GLUCOSE 90 86 111* 79 93  BUN 26* 21 23 34* 40*  CREATININE 1.34* 1.35* 1.18* 1.55* 1.60*  CALCIUM  9.3 10.1 10.0 10.4* 10.8*  MG 1.8 1.7 1.8  --   --   PHOS 2.5 2.0* 2.4* 3.3  --    GFR Estimated Creatinine Clearance: 23.1 mL/min (A) (by C-G formula based on SCr of 1.6 mg/dL (H)). Liver Function Tests: Recent Labs  Lab 10/12/24 0214 10/13/24 0404 10/14/24 0251 10/17/24 0248  AST  --   --  28 39  ALT  --   --  18 19  ALKPHOS  --   --  126 135*  BILITOT  --   --  0.7 0.7  PROT  --   --  6.1* 6.5  ALBUMIN 2.2* 2.4* 2.4* 2.7*   No results for input(s): LIPASE, AMYLASE in the last 168 hours. No results for input(s): AMMONIA in the last 168 hours. Coagulation profile No results for input(s): INR, PROTIME in the last 168 hours.  CBC: Recent Labs  Lab 10/12/24 0214 10/13/24 0404 10/14/24 0251 10/17/24 0248  WBC 3.8* 4.4 3.7* 5.8  NEUTROABS  --   --  2.0  --   HGB  13.1 13.2 13.4 13.9  HCT 41.2 41.1 41.9 43.1  MCV 89.8 88.0 88.4 89.0  PLT 257 279 310 248   Cardiac Enzymes: No results for input(s): CKTOTAL, CKMB, CKMBINDEX, TROPONINI in the last 168  hours. BNP (last 3 results) No results for input(s): PROBNP in the last 8760 hours. CBG: No results for input(s): GLUCAP in the last 168 hours. D-Dimer: No results for input(s): DDIMER in the last 72 hours. Hgb A1c: No results for input(s): HGBA1C in the last 72 hours. Lipid Profile: No results for input(s): CHOL, HDL, LDLCALC, TRIG, CHOLHDL, LDLDIRECT in the last 72 hours. Thyroid  function studies: No results for input(s): TSH, T4TOTAL, T3FREE, THYROIDAB in the last 72 hours.  Invalid input(s): FREET3 Anemia work up: No results for input(s): VITAMINB12, FOLATE, FERRITIN, TIBC, IRON , RETICCTPCT in the last 72 hours. Sepsis Labs: Recent Labs  Lab 10/12/24 0214 10/13/24 0404 10/14/24 0251 10/17/24 0248  PROCALCITON  --  0.55  --   --   WBC 3.8* 4.4 3.7* 5.8    Microbiology Recent Results (from the past 240 hours)  Resp panel by RT-PCR (RSV, Flu A&B, Covid) Anterior Nasal Swab     Status: None   Collection Time: 10/09/24  4:10 PM   Specimen: Anterior Nasal Swab  Result Value Ref Range Status   SARS Coronavirus 2 by RT PCR NEGATIVE NEGATIVE Final   Influenza A by PCR NEGATIVE NEGATIVE Final   Influenza B by PCR NEGATIVE NEGATIVE Final    Comment: (NOTE) The Xpert Xpress SARS-CoV-2/FLU/RSV plus assay is intended as an aid in the diagnosis of influenza from Nasopharyngeal swab specimens and should not be used as a sole basis for treatment. Nasal washings and aspirates are unacceptable for Xpert Xpress SARS-CoV-2/FLU/RSV testing.  Fact Sheet for Patients: bloggercourse.com  Fact Sheet for Healthcare Providers: seriousbroker.it  This test is not yet approved or cleared by the [place] FDA and has been authorized for detection and/or diagnosis of SARS-CoV-2 by FDA under an Emergency Use Authorization (EUA). This EUA will remain in effect (meaning this test can be used) for the duration of the COVID-19 declaration under Section 564(b)(1) of the Act, 21 U.S.C. section 360bbb-3(b)(1), unless the authorization is terminated or revoked.     Resp Syncytial Virus by PCR NEGATIVE NEGATIVE Final    Comment: (NOTE) Fact Sheet for Patients: bloggercourse.com  Fact Sheet for Healthcare Providers: seriousbroker.it  This test is not yet approved or cleared by the [place]  FDA and has been authorized for detection and/or diagnosis of SARS-CoV-2 by FDA under an Emergency Use Authorization (EUA). This EUA will remain in effect (meaning this test can be used) for the duration of the COVID-19 declaration under Section 564(b)(1) of the Act, 21 U.S.C. section 360bbb-3(b)(1), unless the authorization is terminated or revoked.  Performed at [facility], [address] [place], [place] [address]   Urine Culture (for pregnant, neutropenic or urologic patients or patients with an indwelling urinary catheter)     Status: Abnormal   Collection Time: 10/09/24  8:44 PM   Specimen: Urine, Clean Catch  Result Value Ref Range Status   Specimen Description URINE, CLEAN CATCH  Final   Special Requests   Final    NONE Performed at [facility], [address], [place], [place] [address]    Culture 30,000 COLONIES/mL PROTEUS MIRABILIS (A)  Final   Report Status 10/11/2024 FINAL  Final   Organism ID, Bacteria PROTEUS MIRABILIS (A)  Final      Susceptibility   Proteus mirabilis - MIC*    AMPICILLIN <=2 SENSITIVE Sensitive     CEFAZOLIN  (URINE) Value in next row Sensitive      4 SENSITIVEThis is a modified FDA-approved  test that has been validated and its performance characteristics determined by the reporting  laboratory.  This laboratory is certified under the Clinical Laboratory Improvement Amendments CLIA as qualified to perform high complexity clinical laboratory testing.    CEFEPIME Value in next row Sensitive      4 SENSITIVEThis is a modified FDA-approved test that has been validated and its performance characteristics determined by the reporting laboratory.  This laboratory is certified under the Clinical Laboratory Improvement Amendments CLIA as qualified to perform high complexity clinical laboratory testing.    ERTAPENEM Value in next row Sensitive      4 SENSITIVEThis is a modified FDA-approved test that has been validated and its performance characteristics determined by the reporting laboratory.  This laboratory is certified under the Clinical Laboratory Improvement Amendments CLIA as qualified to perform high complexity clinical laboratory testing.    CEFTRIAXONE  Value in next row Sensitive      4 SENSITIVEThis is a modified FDA-approved test that has been validated and its performance characteristics determined by the reporting laboratory.  This laboratory is certified under the Clinical Laboratory Improvement Amendments CLIA as qualified to perform high complexity clinical laboratory testing.    CIPROFLOXACIN  Value in next row Sensitive      4 SENSITIVEThis is a modified FDA-approved test that has been validated and its performance characteristics determined by the reporting laboratory.  This laboratory is certified under the Clinical Laboratory Improvement Amendments CLIA as qualified to perform high complexity clinical laboratory testing.    GENTAMICIN  Value in next row Sensitive      4 SENSITIVEThis is a modified FDA-approved test that has been validated and its performance characteristics determined by the reporting laboratory.  This laboratory is certified under the Clinical Laboratory Improvement Amendments CLIA as qualified to perform high complexity clinical laboratory testing.     NITROFURANTOIN Value in next row Resistant      4 SENSITIVEThis is a modified FDA-approved test that has been validated and its performance characteristics determined by the reporting laboratory.  This laboratory is certified under the Clinical Laboratory Improvement Amendments CLIA as qualified to perform high complexity clinical laboratory testing.    TRIMETH/SULFA Value in next row Sensitive      4 SENSITIVEThis is a modified FDA-approved test that has been validated and its performance characteristics determined by the reporting laboratory.  This laboratory is certified under the Clinical Laboratory Improvement Amendments CLIA as qualified to perform high complexity clinical laboratory testing.    AMPICILLIN/SULBACTAM Value in next row Sensitive      4 SENSITIVEThis is a modified FDA-approved test that has been validated and its performance characteristics determined by the reporting laboratory.  This laboratory is certified under the Clinical Laboratory Improvement Amendments CLIA as qualified to perform high complexity clinical laboratory testing.    PIP/TAZO Value in next row Sensitive      <=4 SENSITIVEThis is a modified FDA-approved test that has been validated and its performance characteristics determined by the reporting laboratory.  This laboratory is certified under the Clinical Laboratory Improvement Amendments CLIA as qualified to perform high complexity clinical laboratory testing.    MEROPENEM Value in next row Sensitive      <=4 SENSITIVEThis is a modified FDA-approved test that has been validated and its performance characteristics determined by the reporting laboratory.  This laboratory is certified under the Clinical Laboratory Improvement Amendments CLIA as qualified to perform high complexity clinical laboratory testing.    * 30,000 COLONIES/mL PROTEUS MIRABILIS  MRSA Next Gen by  PCR, Nasal     Status: None   Collection Time: 10/09/24  8:44 PM   Specimen: Urine, Clean Catch;  Nasal Swab  Result Value Ref Range Status   MRSA by PCR Next Gen NOT DETECTED NOT DETECTED Final    Comment: (NOTE) The GeneXpert MRSA Assay (FDA approved for NASAL specimens only), is one component of a comprehensive MRSA colonization surveillance program. It is not intended to diagnose MRSA infection nor to guide or monitor treatment for MRSA infections. Test performance is not FDA approved in patients less than 69 years old. Performed at [facility], [address], [place], [place] [address]     Procedures and diagnostic studies:  No results found.             LOS: 8 days   Tashanti Dalporto  [facility]   Pager on [email or web address]. If 7PM-7AM, please contact night-coverage at [email or web address]     10/18/2024, 1:00 PM

## 2024-10-18 NOTE — Progress Notes (Signed)
 Physical Therapy Treatment Patient Details Name: Barbara [name] MRN: [id number] DOB: [birth year] Today's Date: 10/18/2024   History of Present Illness 83 yo female adm 12/6 with SOB and chest pain. Admitted for NSTEMI and acute CHF exacerbation. PMH includes: CAD, HFpEF, A-fib s/p PPM, essential HTN, HLD, anxiety, CKD, anemia, Rt TSA, vertigo, Lt femur fx    PT Comments  Pt reports she will not sit up in recliner as it is too uncomfortable but that she will participate in therapy. Pt with c/o OA joint pain with moving to EoB with min A. Performed seated LE exercise prior to ambulation. Noted to have large amplitude myoclonic jerking during exercise, which continued into gait. Pt requires modA for power up to standing and min-modA for steadying due to large amplitude jerks. Provided close chair follow. Pt requiring seated rest break after 5 feet of ambulation and requires modA for standing, walking back to bed and getting LE back into bed. D/c plans remain appropriate at this time. PT will continue to follow acutely.     If plan is discharge home, recommend the following: A little help with walking and/or transfers;A little help with bathing/dressing/bathroom;Assistance with cooking/housework;Assist for transportation;Help with stairs or ramp for entrance   Can travel by private vehicle     No  Equipment Recommendations  None recommended by PT       Precautions / Restrictions Precautions Precautions: Fall Recall of Precautions/Restrictions: Intact Restrictions Weight Bearing Restrictions Per Provider Order: No     Mobility  Bed Mobility Overal bed mobility: Needs Assistance Bed Mobility: Supine to Sit, Sit to Supine     Supine to sit: Min assist, HOB elevated, Used rails Sit to supine: Mod assist   General bed mobility comments: with cuing pt is able to navigate LE off bed, use L arm to pull across body to bring hips to EoB and min A for bringing her trunk to upright     Transfers Overall transfer level: Needs assistance Equipment used: Rolling walker (2 wheels) Transfers: Sit to/from Stand Sit to Stand: Mod assist           General transfer comment: mod A from slightly elevated bed height and from lower straight back chair    Ambulation/Gait Ambulation/Gait assistance: Min assist, Mod assist Gait Distance (Feet): 5 Feet (2) Assistive device: Rolling walker (2 wheels) Gait Pattern/deviations: Step-to pattern, Decreased step length - right, Decreased step length - left, Trunk flexed, Wide base of support Gait velocity: decreased Gait velocity interpretation: <1.31 ft/sec, indicative of household ambulator   General Gait Details: pt min A for ambulation but progresses to modA with close chair follow with onset of increasing B knee/ buckling, (more myoclonic jerking. requires seated rest break due to increased frequency, with rest break pt is able to ambulate back to with increasing jerking Pt reports that her niece had something similar       Balance Overall balance assessment: Needs assistance Sitting-balance support: No upper extremity supported, Feet supported Sitting balance-Leahy Scale: Good     Standing balance support: Bilateral upper extremity supported, During functional activity Standing balance-Leahy Scale: Poor Standing balance comment: reliant on RW support                            Communication Communication Communication: No apparent difficulties  Cognition Arousal: Alert Behavior During Therapy: [facility] for tasks assessed/performed  Following commands: Impaired Following commands impaired: Follows one step commands with increased time    Cueing Cueing Techniques: Verbal cues  Exercises General Exercises - Lower Extremity Ankle Circles/Pumps: AROM, Both, 10 reps, Seated Long Arc Quad: AROM, Both, 5 reps, Seated Hip Flexion/Marching: AROM, Both, 5 reps, Seated Toe  Raises: AROM, Both, 5 reps, Seated Heel Raises: AROM, Both, 5 reps, Seated    General Comments General comments (skin integrity, edema, etc.): VSS on RA, pt reporting increased OA pain, provided exercises prior to ambulation      Pertinent Vitals/Pain Pain Assessment Pain Assessment: Faces Faces Pain Scale: Hurts little more Pain Location: joints with initial movement reports OA Pain Descriptors / Indicators: Aching, Sore Pain Intervention(s): Limited activity within patient's tolerance, Monitored during session, Repositioned     PT Goals (current goals can now be found in the care plan section) Progress towards PT goals: Progressing toward goals (slowed)    Frequency    Min 2X/week       AM-PAC PT 6 Clicks Mobility   Outcome Measure  Help needed turning from your back to your side while in a flat bed without using bedrails?: A Little Help needed moving from lying on your back to sitting on the side of a flat bed without using bedrails?: A Little Help needed moving to and from a bed to a chair (including a wheelchair)?: A Lot Help needed standing up from a chair using your arms (e.g., wheelchair or bedside chair)?: A Lot Help needed to walk in hospital room?: A Lot Help needed climbing 3-5 steps with a railing? : A Lot 6 Click Score: 14    End of Session Equipment Utilized During Treatment: Gait belt Activity Tolerance: Treatment limited secondary to medical complications (Comment) (myoclonic jerking LE>UE) Patient left: in bed;with call bell/phone within reach;with bed alarm set (pt reports recliner is too uncomfortable) Nurse Communication: Mobility status PT Visit Diagnosis: Unsteadiness on feet (R26.81);Other abnormalities of gait and mobility (R26.89);Repeated falls (R29.6);Muscle weakness (generalized) (M62.81)     Time: 8970-8941 PT Time Calculation (min) (ACUTE ONLY): 29 min  Charges:    $Gait Training: 8-22 mins $Therapeutic Exercise: 8-22 mins PT  General Charges $$ ACUTE PT VISIT: 1 Visit                     Lonny Eisen B. Fleeta Lapidus PT, DPT Acute Rehabilitation Services Please use secure chat or  Call Office [phone]    Barbara [name] 10/18/2024, 11:15 AM

## 2024-10-19 MED ORDER — POLYETHYLENE GLYCOL 3350 17 G PO PACK
17.0000 g | PACK | Freq: Every day | ORAL | Status: DC | PRN
  Administered 2024-11-02 – 2024-11-03 (×2): 17 g via ORAL

## 2024-10-19 MED ORDER — GUAIFENESIN ER 600 MG PO TB12
600.0000 mg | ORAL_TABLET | Freq: Two times a day (BID) | ORAL | Status: DC
  Administered 2024-10-19 – 2024-11-06 (×36): 600 mg via ORAL
  Filled 2024-10-19 (×26): qty 1

## 2024-10-19 NOTE — Plan of Care (Signed)
°  Problem: Education: Goal: Knowledge of General Education information will improve Description: Including pain rating scale, medication(s)/side effects and non-pharmacologic comfort measures Outcome: Progressing   Problem: Health Behavior/Discharge Planning: Goal: Ability to manage health-related needs will improve Outcome: Progressing   Problem: Clinical Measurements: Goal: Respiratory complications will improve Outcome: Progressing Goal: Cardiovascular complication will be avoided Outcome: Progressing   Problem: Nutrition: Goal: Adequate nutrition will be maintained Outcome: Progressing   Problem: Skin Integrity: Goal: Risk for impaired skin integrity will decrease Outcome: Progressing   Problem: Health Behavior/Discharge Planning: Goal: Ability to safely manage health-related needs after discharge will improve Outcome: Progressing

## 2024-10-19 NOTE — Plan of Care (Signed)
°  Problem: Health Behavior/Discharge Planning: Goal: Ability to manage health-related needs will improve Outcome: Progressing   Problem: Clinical Measurements: Goal: Ability to maintain clinical measurements within normal limits will improve Outcome: Progressing Goal: Will remain free from infection Outcome: Progressing Goal: Diagnostic test results will improve Outcome: Progressing Goal: Respiratory complications will improve Outcome: Progressing Goal: Cardiovascular complication will be avoided Outcome: Progressing   Problem: Nutrition: Goal: Adequate nutrition will be maintained Outcome: Progressing   Problem: Coping: Goal: Level of anxiety will decrease Outcome: Progressing   Problem: Elimination: Goal: Will not experience complications related to bowel motility Outcome: Progressing Goal: Will not experience complications related to urinary retention Outcome: Progressing   Problem: Pain Managment: Goal: General experience of comfort will improve and/or be controlled Outcome: Progressing   Problem: Safety: Goal: Ability to remain free from injury will improve Outcome: Progressing   Problem: Skin Integrity: Goal: Risk for impaired skin integrity will decrease Outcome: Progressing   Problem: Education: Goal: Understanding of CV disease, CV risk reduction, and recovery process will improve Outcome: Progressing Goal: Individualized Educational Video(s) Outcome: Progressing   Problem: Cardiovascular: Goal: Ability to achieve and maintain adequate cardiovascular perfusion will improve Outcome: Progressing Goal: Vascular access site(s) Level 0-1 will be maintained Outcome: Progressing

## 2024-10-19 NOTE — Plan of Care (Signed)

## 2024-10-19 NOTE — TOC Progression Note (Signed)
 Transition of Care [facility]) - Progression Note    Patient Details  Name: Barbara [name] MRN: [id number] Date of Birth: 06-18-1941  Transition [facility] Care [facility]) CM/SW Contact  Waddell Barnie Rama, RN Phone Number: 10/19/2024, 3:05 PM  Clinical Narrative:     NCM was notified that patient will now have to go home with [facility].  CSW told him about the papa pal hours she could get 20 hrs per week , 60 hrs per year thru her insurance with [employer].  NCM contacted [employer] also ,  the rep states the papa pal hours are for a companion to help with house work around the home, cleaning, washing clothes , runnin errands etc.  They are not allowed to touch the patient.  NCM had Jerod, the son on the phone as well speaking with the rep with [employer],  son states they have already used papa pal before and that's not what they need they need someone to help with mobility to get her up out of bed and  get her dressed.  NCM informed him will send him the Medicare. Gov list so he can choose the [facility] agency , he states ok.  NCM emailed the list to him.  Awaiting to hear back from Jerod.     Expected Discharge Plan: Home w Home Health Services Barriers to Discharge: Continued Medical Work up, Other (must enter comment) (Disposition planning)               Expected Discharge Plan and Services In-house Referral: Clinical Social Work   Post Acute Care Choice: Skilled Nursing Facility Living arrangements for the past 2 months: Single Family Home                                       Social Drivers of Health (SDOH) Interventions SDOH Screenings   Food Insecurity: No Food Insecurity (10/09/2024)  Housing: High Risk (06/20/2024)  Transportation Needs: Unmet Transportation Needs (06/20/2024)  Utilities: Not At Risk (10/09/2024)  Alcohol Screen: Low Risk (08/05/2022)  Depression (PHQ2-9): Low Risk (04/05/2024)  Financial Resource Strain: Low Risk (08/05/2022)  Physical Activity: Insufficiently Active (07/02/2022)   Social Connections: Moderately Integrated (06/20/2024)  Stress: No Stress Concern Present (07/02/2022)  Tobacco Use: Medium Risk (10/09/2024)    Readmission Risk Interventions    05/24/2024    8:57 AM 03/11/2024    1:07 PM  Readmission Risk Prevention Plan  Transportation Screening Complete Complete  PCP or Specialist Appt within 5-7 Days  Complete  Home Care Screening  Complete  Medication Review (RN CM)  Complete  HRI or Home Care Consult Complete   Social Work Consult for Recovery Care Planning/Counseling Complete   Palliative Care Screening Not Applicable   Medication Review Oceanographer) Complete

## 2024-10-19 NOTE — Progress Notes (Signed)
 Ambulated pt to bathroom with front wheel walker and gait belt, 2 assist.  Patient gait unsteady with multiple events of bilateral knees buckling.  RN with strong use of gait belt to assist patient.   Tolerated ambulation fair.  Back to bed without incident.

## 2024-10-19 NOTE — Telephone Encounter (Signed)
 Attempted to contact pt chart states that pt is currently at the Hospital. Will keep trying

## 2024-10-19 NOTE — Progress Notes (Addendum)
 Progress Note    Barbara [name]  FMW:[id number] DOB: Jan 08, 1941  DOA: 10/09/2024 PCP: Johnny Garnette LABOR, MD      Brief Narrative:    Medical records reviewed and are as summarized below:  Barbara [name] is a 83 y.o. female medical history significant of CAD, a.fib on eliquis , HTN, HLD, GERD, CKD3a, SSS sp pacemaker, who presented to the hospital with chest pain.      Assessment/Plan:   Principal Problem:   Unstable angina (HCC) Active Problems:   NSTEMI (non-ST elevated myocardial infarction) (HCC)   Normocytic anemia   CAD (coronary artery disease/elevated troponin   PAF (paroxysmal atrial fibrillation) (HCC)   Primary hypertension   Chronic kidney disease, stage 3b (HCC)   Hyperlipidemia   Gastroesophageal reflux disease   Tachycardia-bradycardia syndrome (HCC)   Failure to thrive in adult   Acute on chronic diastolic heart failure (HCC)   Hypokalemia   Moderate protein malnutrition   Night sweats   Nutrition Problem: Increased nutrient needs Etiology: chronic illness (CHF, wound healing)  Signs/Symptoms: estimated needs   Body mass index is 19.24 kg/m.   Chest pain, mildly elevated troponins: Peak troponin 805. S/p treatment with IV heparin  drip.  She was seen by the cardiologist. S/p left heart cath with patent proximal LAD stent with minimal restenosis. 2D echo did not show wall motion abnormality. Medical management was recommended.  Continue aspirin , Lipitor and metoprolol .   Acute on chronic diastolic CHF: She feels much better.  S/p treatment with IV Lasix . Continue oral Lasix . BNP 960. 2D echo showed EF estimated at 60 to 65%, apical hypertrophic cardiomyopathy, grade 2 diastolic dysfunction, mild MR, moderate left atrial enlargement.   Atrial fibrillation: Heart rate controlled.  Continue amiodarone , metoprolol  and Eliquis .     Hypophosphatemia: Improved Mildly elevated calcium  level probably in the setting of Carafate   use.   Loose stools.  Discontinue scheduled MiraLAX  and use MiraLAX  and Senokot only as needed for constipation.   Comorbidities include CKD stage IIIb, CAD, tachybradycardia syndrome s/p permanent pacemaker placement   PT recommended discharge to SNF.  However, insurance denied authorization to SNF.  Patient has  appealed against this decision.  Appeal was denied.  Unfortunately, patient lives at home alone and there is no safe discharge plan in place yet.  Plan of care was discussed with Jerod, son, over the phone.  He said he was told he could appeal the decision for the second time.  I explained that I did not think that was possible and I was under the impression that he was thinking about paying privately for SNF or private caregivers.  However, he said that paying out of pocket is too expensive.  He said it is $200 a day and $1400 a week. I've asked Lauraine, to call Jerod to discuss disposition plans.   ADDENDUM  I spoke with Jerod again.  He said he had spoken with Lauraine, child psychotherapist.  He will be emailed with options for private care services and that patient will be eligible for 20 hours a week up to 60 hours a year.  He does not want patient to be discharged until this has been sorted out.      Diet Order             Diet Heart Room service appropriate? Yes; Fluid consistency: Thin  Diet effective now  Consultants: Cardiologist  Procedures: None    Medications:    (feeding supplement) PROSource Plus  30 mL Oral BID BM   amiodarone   200 mg Oral Daily   apixaban   2.5 mg Oral BID   atorvastatin   40 mg Oral Daily   empagliflozin   10 mg Oral Daily   feeding supplement  237 mL Oral BID BM   furosemide   40 mg Oral Daily   guaiFENesin   600 mg Oral BID   isosorbide  mononitrate  30 mg Oral Daily   metoprolol  tartrate  25 mg Oral BID   multivitamin with minerals  1 tablet Oral Daily   pantoprazole   40 mg Oral  Daily   polyethylene glycol  17 g Oral BID   senna-docusate  1 tablet Oral QHS   sodium chloride  flush  3 mL Intravenous Q12H   sodium chloride  flush  3 mL Intravenous Q12H   zinc  sulfate (50mg  elemental zinc )  220 mg Oral Daily   Continuous Infusions:   Anti-infectives (From admission, onward)    None              Family Communication/Anticipated D/C date and plan/Code Status   DVT prophylaxis: apixaban  (ELIQUIS ) tablet 2.5 mg Start: 10/12/24 0800 apixaban  (ELIQUIS ) tablet 2.5 mg     Code Status: Full Code  Family Communication: None Disposition Plan: Plan to discharge to SNF   Status is: Inpatient Remains inpatient appropriate because: No safe discharge planning in place       Subjective:   Interval events noted.  No complaints.  Objective:    Vitals:   10/19/24 0400 10/19/24 0719 10/19/24 0847 10/19/24 1105  BP: 130/74 131/80 (!) 156/69 (!) 133/50  Pulse: 60 60 61   Resp: 13 17  18   Temp: 98.2 F (36.8 C) 98.2 F (36.8 C)  98.1 F (36.7 C)  TempSrc: Oral Oral  Oral  SpO2: 95% 96%  97%  Weight: 57.4 kg     Height:       No data found.   Intake/Output Summary (Last 24 hours) at 10/19/2024 1326 Last data filed at 10/19/2024 1001 Gross per 24 hour  Intake 480 ml  Output 1500 ml  Net -1020 ml   Filed Weights   10/17/24 0500 10/18/24 0412 10/19/24 0400  Weight: 59.2 kg 57 kg 57.4 kg    Exam:  GEN: NAD SKIN: Warm and dry EYES: No pallor or icterus ENT: MMM CV: RRR PULM: CTA B ABD: soft, ND, NT, +BS CNS: AAO x 3, non focal EXT: No edema or tenderness      Data Reviewed:   I have personally reviewed following labs and imaging studies:  Labs: Labs show the following:   Basic Metabolic Panel: Recent Labs  Lab 10/13/24 0404 10/14/24 0251 10/15/24 0223 10/17/24 0248  NA 138 136 138 139  K 3.9 3.9 4.5 4.7  CL 108 102 102 102  CO2 23 28 27 26   GLUCOSE 86 111* 79 93  BUN 21 23 34* 40*  CREATININE 1.35* 1.18* 1.55*  1.60*  CALCIUM  10.1 10.0 10.4* 10.8*  MG 1.7 1.8  --   --   PHOS 2.0* 2.4* 3.3  --    GFR Estimated Creatinine Clearance: 23.3 mL/min (A) (by C-G formula based on SCr of 1.6 mg/dL (H)). Liver Function Tests: Recent Labs  Lab 10/13/24 0404 10/14/24 0251 10/17/24 0248  AST  --  28 39  ALT  --  18 19  ALKPHOS  --  126 135*  BILITOT  --  0.7 0.7  PROT  --  6.1* 6.5  ALBUMIN 2.4* 2.4* 2.7*   No results for input(s): LIPASE, AMYLASE in the last 168 hours. No results for input(s): AMMONIA in the last 168 hours. Coagulation profile No results for input(s): INR, PROTIME in the last 168 hours.  CBC: Recent Labs  Lab 10/13/24 0404 10/14/24 0251 10/17/24 0248  WBC 4.4 3.7* 5.8  NEUTROABS  --  2.0  --   HGB 13.2 13.4 13.9  HCT 41.1 41.9 43.1  MCV 88.0 88.4 89.0  PLT 279 310 248   Cardiac Enzymes: No results for input(s): CKTOTAL, CKMB, CKMBINDEX, TROPONINI in the last 168 hours. BNP (last 3 results) No results for input(s): PROBNP in the last 8760 hours. CBG: No results for input(s): GLUCAP in the last 168 hours. D-Dimer: No results for input(s): DDIMER in the last 72 hours. Hgb A1c: No results for input(s): HGBA1C in the last 72 hours. Lipid Profile: No results for input(s): CHOL, HDL, LDLCALC, TRIG, CHOLHDL, LDLDIRECT in the last 72 hours. Thyroid  function studies: No results for input(s): TSH, T4TOTAL, T3FREE, THYROIDAB in the last 72 hours.  Invalid input(s): FREET3 Anemia work up: No results for input(s): VITAMINB12, FOLATE, FERRITIN, TIBC, IRON , RETICCTPCT in the last 72 hours. Sepsis Labs: Recent Labs  Lab 10/13/24 0404 10/14/24 0251 10/17/24 0248  PROCALCITON 0.55  --   --   WBC 4.4 3.7* 5.8    Microbiology Recent Results (from the past 240 hours)  Resp panel by RT-PCR (RSV, Flu A&B, Covid) Anterior Nasal Swab     Status: None   Collection Time: 10/09/24  4:10 PM   Specimen: Anterior Nasal  Swab  Result Value Ref Range Status   SARS Coronavirus 2 by RT PCR NEGATIVE NEGATIVE Final   Influenza A by PCR NEGATIVE NEGATIVE Final   Influenza B by PCR NEGATIVE NEGATIVE Final    Comment: (NOTE) The Xpert Xpress SARS-CoV-2/FLU/RSV plus assay is intended as an aid in the diagnosis of influenza from Nasopharyngeal swab specimens and should not be used as a sole basis for treatment. Nasal washings and aspirates are unacceptable for Xpert Xpress SARS-CoV-2/FLU/RSV testing.  Fact Sheet for Patients: bloggercourse.com  Fact Sheet for Healthcare Providers: seriousbroker.it  This test is not yet approved or cleared by the [place]  FDA and has been authorized for detection and/or diagnosis of SARS-CoV-2 by FDA under an Emergency Use Authorization (EUA). This EUA will remain in effect (meaning this test can be used) for the duration of the COVID-19 declaration under Section 564(b)(1) of the Act, 21 U.S.C. section 360bbb-3(b)(1), unless the authorization is terminated or revoked.     Resp Syncytial Virus by PCR NEGATIVE NEGATIVE Final    Comment: (NOTE) Fact Sheet for Patients: bloggercourse.com  Fact Sheet for Healthcare Providers: seriousbroker.it  This test is not yet approved or cleared by the [place]  FDA and has been authorized for detection and/or diagnosis of SARS-CoV-2 by FDA under an Emergency Use Authorization (EUA). This EUA will remain in effect (meaning this test can be used) for the duration of the COVID-19 declaration under Section 564(b)(1) of the Act, 21 U.S.C. section 360bbb-3(b)(1), unless the authorization is terminated or revoked.  Performed at [facility], [address] [place], [place] [address]   Urine Culture (for pregnant, neutropenic or urologic patients or patients with an indwelling urinary catheter)     Status: Abnormal    Collection Time: 10/09/24  8:44 PM   Specimen: Urine,  Clean Catch  Result Value Ref Range Status   Specimen Description URINE, CLEAN CATCH  Final   Special Requests   Final    NONE Performed at [facility], [address], [place], [place] [address]    Culture 30,000 COLONIES/mL PROTEUS MIRABILIS (A)  Final   Report Status 10/11/2024 FINAL  Final   Organism ID, Bacteria PROTEUS MIRABILIS (A)  Final      Susceptibility   Proteus mirabilis - MIC*    AMPICILLIN <=2 SENSITIVE Sensitive     CEFAZOLIN  (URINE) Value in next row Sensitive      4 SENSITIVEThis is a modified FDA-approved test that has been validated and its performance characteristics determined by the reporting laboratory.  This laboratory is certified under the Clinical Laboratory Improvement Amendments CLIA as qualified to perform high complexity clinical laboratory testing.    CEFEPIME Value in next row Sensitive      4 SENSITIVEThis is a modified FDA-approved test that has been validated and its performance characteristics determined by the reporting laboratory.  This laboratory is certified under the Clinical Laboratory Improvement Amendments CLIA as qualified to perform high complexity clinical laboratory testing.    ERTAPENEM Value in next row Sensitive      4 SENSITIVEThis is a modified FDA-approved test that has been validated and its performance characteristics determined by the reporting laboratory.  This laboratory is certified under the Clinical Laboratory Improvement Amendments CLIA as qualified to perform high complexity clinical laboratory testing.    CEFTRIAXONE  Value in next row Sensitive      4 SENSITIVEThis is a modified FDA-approved test that has been validated and its performance characteristics determined by the reporting laboratory.  This laboratory is certified under the Clinical Laboratory Improvement Amendments CLIA as qualified to perform high complexity clinical laboratory testing.    CIPROFLOXACIN   Value in next row Sensitive      4 SENSITIVEThis is a modified FDA-approved test that has been validated and its performance characteristics determined by the reporting laboratory.  This laboratory is certified under the Clinical Laboratory Improvement Amendments CLIA as qualified to perform high complexity clinical laboratory testing.    GENTAMICIN  Value in next row Sensitive      4 SENSITIVEThis is a modified FDA-approved test that has been validated and its performance characteristics determined by the reporting laboratory.  This laboratory is certified under the Clinical Laboratory Improvement Amendments CLIA as qualified to perform high complexity clinical laboratory testing.    NITROFURANTOIN Value in next row Resistant      4 SENSITIVEThis is a modified FDA-approved test that has been validated and its performance characteristics determined by the reporting laboratory.  This laboratory is certified under the Clinical Laboratory Improvement Amendments CLIA as qualified to perform high complexity clinical laboratory testing.    TRIMETH/SULFA Value in next row Sensitive      4 SENSITIVEThis is a modified FDA-approved test that has been validated and its performance characteristics determined by the reporting laboratory.  This laboratory is certified under the Clinical Laboratory Improvement Amendments CLIA as qualified to perform high complexity clinical laboratory testing.    AMPICILLIN/SULBACTAM Value in next row Sensitive      4 SENSITIVEThis is a modified FDA-approved test that has been validated and its performance characteristics determined by the reporting laboratory.  This laboratory is certified under the Clinical Laboratory Improvement Amendments CLIA as qualified to perform high complexity clinical laboratory testing.    PIP/TAZO Value in next row Sensitive      <=  4 SENSITIVEThis is a modified FDA-approved test that has been validated and its performance characteristics determined by the  reporting laboratory.  This laboratory is certified under the Clinical Laboratory Improvement Amendments CLIA as qualified to perform high complexity clinical laboratory testing.    MEROPENEM Value in next row Sensitive      <=4 SENSITIVEThis is a modified FDA-approved test that has been validated and its performance characteristics determined by the reporting laboratory.  This laboratory is certified under the Clinical Laboratory Improvement Amendments CLIA as qualified to perform high complexity clinical laboratory testing.    * 30,000 COLONIES/mL PROTEUS MIRABILIS  MRSA Next Gen by PCR, Nasal     Status: None   Collection Time: 10/09/24  8:44 PM   Specimen: Urine, Clean Catch; Nasal Swab  Result Value Ref Range Status   MRSA by PCR Next Gen NOT DETECTED NOT DETECTED Final    Comment: (NOTE) The GeneXpert MRSA Assay (FDA approved for NASAL specimens only), is one component of a comprehensive MRSA colonization surveillance program. It is not intended to diagnose MRSA infection nor to guide or monitor treatment for MRSA infections. Test performance is not FDA approved in patients less than 87 years old. Performed at [facility], [address] [place], [place] [address]     Procedures and diagnostic studies:  No results found.             LOS: 9 days   Anshul Meddings  [employer]ered Loss Adjuster on [email or web address]. If 7PM-7AM, please contact night-coverage at [email or web address]     10/19/2024, 1:26 PM

## 2024-10-19 NOTE — Progress Notes (Signed)
 Mobility Specialist Progress Note:    10/19/24 1105  Therapy Vitals  Temp 98.1 F (36.7 C)  Temp Source Oral  Resp 18  BP (!) 133/50  Patient Position (if appropriate) Lying  Oxygen Therapy  SpO2 97 %  Mobility  Activity Ambulated with assistance  Level of Assistance Moderate assist, patient does 50-74%  Assistive Device Front wheel walker  Distance Ambulated (ft) 3 ft  Activity Response Tolerated fair  Mobility Referral Yes  Mobility visit 1 Mobility  Mobility Specialist Start Time (ACUTE ONLY) 0915  Mobility Specialist Stop Time (ACUTE ONLY) [id number]  Mobility Specialist Time Calculation (min) (ACUTE ONLY) 11 min   Pt received in bed agreeable to mobility. MinA for bed mobility yet ModA for STS and ambulation d/t multiple times of bilat knee buckling. Pt took a couple steps then had to sit down and was able to take a few more after that. Left in bed in chair position w/ call bell and personal belongings in reach. All needs met.   Thersia Minder Mobility Specialist  Please contact vis Secure Chat or  Rehab Office [phone]

## 2024-10-19 NOTE — TOC Progression Note (Signed)
 Transition of Care [facility]) - Progression Note    Patient Details  Name: Barbara [name] MRN: [id number] Date of Birth: 1941-01-03  Transition of Care [facility]) CM/SW Contact  Lauraine FORBES Saa, LCSWA Phone Number: 10/19/2024, 2:50 PM  Clinical Narrative:     2:50 PM CSW followed up with patient and patient's son, Barbara [name], on private paying for personal care services or private paying for SNF. CSW reiterated that [facility] SNF appeal decision will not be provided for another 2-4 weeks and that [facility] SNF decision cannot be appealed. CSW informed patient and Barbara [name] of [employer] benefit of PCS (20 hours a week up to 60 hours a year). Patient and patient's son were agreeable with PCS through [employer]. CSW made medial team aware. TOC will continue to follow.  Expected Discharge Plan: Home w Home Health Services Barriers to Discharge: Continued Medical Work up, Other (must enter comment) (Disposition planning)               Expected Discharge Plan and Services In-house Referral: Clinical Social Work   Post Acute Care Choice: Skilled Nursing Facility Living arrangements for the past 2 months: Single Family Home                                       Social Drivers of Health (SDOH) Interventions SDOH Screenings   Food Insecurity: No Food Insecurity (10/09/2024)  Housing: High Risk (06/20/2024)  Transportation Needs: Unmet Transportation Needs (06/20/2024)  Utilities: Not At Risk (10/09/2024)  Alcohol Screen: Low Risk (08/05/2022)  Depression (PHQ2-9): Low Risk (04/05/2024)  Financial Resource Strain: Low Risk (08/05/2022)  Physical Activity: Insufficiently Active (07/02/2022)  Social Connections: Moderately Integrated (06/20/2024)  Stress: No Stress Concern Present (07/02/2022)  Tobacco Use: Medium Risk (10/09/2024)    Readmission Risk Interventions    05/24/2024    8:57 AM 03/11/2024    1:07 PM  Readmission Risk Prevention Plan  Transportation Screening Complete Complete  PCP or  Specialist Appt within 5-7 Days  Complete  Home Care Screening  Complete  Medication Review (RN CM)  Complete  HRI or Home Care Consult Complete   Social Work Consult for Recovery Care Planning/Counseling Complete   Palliative Care Screening Not Applicable   Medication Review Oceanographer) Complete

## 2024-10-20 DIAGNOSIS — I200 Unstable angina: Secondary | ICD-10-CM | POA: Diagnosis not present

## 2024-10-20 NOTE — Plan of Care (Signed)

## 2024-10-20 NOTE — Progress Notes (Signed)
 PROGRESS NOTE  Barbara [name] FMW:[id number] DOB: [birth year] DOA: 10/09/2024 PCP: Johnny Garnette LABOR, MD   LOS: 10 days   Brief Narrative / Interim history: 83 year old female with history of CAD, PAF on Eliquis , HTN, HLD, CKD 3a, sick sinus syndrome status post pacemaker who comes into the hospital with chest pain.  Cardiology was consulted on admission, underwent left heart cardiac cath with patent proximal LAD stent with minimal restenosis, mild nonobstructive disease in the mid circumflex and mild nonobstructive disease in the mid RCA, recommending medical management.  SNF was recommended, however insurance is refusing to approve.  Decision was appealed and insurance declined again despite medical team recommendations.  Subjective / 24h Interval events: She is doing well this morning, denies any chest pain, eating breakfast.  She has multiple complaints today, most of them chronic, that she is unable to live by herself without any help, her children are out of state, has had several falls, has become very weak and has even difficulties feeding herself sometimes.  Assesement and Plan: Principal problem Chest pain, nonobstructive CAD -cardiology consulted and evaluated patient while hospitalized.  She underwent left heart cardiac cath with patent proximal LAD stent with minimal restenosis, mild nonobstructive disease in the mid circumflex and mild nonobstructive disease in the mid RCA, recommending medical management. - Continue statin, Imdur , metoprolol   Active problems Acute on chronic diastolic CHF -echocardiogram showed EF of 60 to 65%, apical hypertrophic cardiomyopathy, grade 2 diastolic dysfunction.  She was initially fluid overloaded requiring diuresis with IV Lasix , currently she is on furosemide  40 mg daily p.o., tolerating it well.  Appears euvolemic  CKD 3B -baseline creatinine over 2025 appears to range between 1.2 and 1.6.  Currently she appears close to baseline.  Closely  monitor  PAF -continue amiodarone , metoprolol , she is anticoagulated with Eliquis   SSS status post pacemaker-paced on telemetry  Hypophosphatemia-continue to monitor and replenish as indicated  AGMA -resolved  Disposition -to be determined, overall difficult as she lives by herself and insurance has denied her to go to SNF.  She is quite unsteady and based on most recent evaluation requires 2 assist, unsteady even with a walker and bilateral knees buckling.  Case manager working with the family to see what can be arranged in an outpatient setting, but ideally she should have 24/7 care  Scheduled Meds:  (feeding supplement) PROSource Plus  30 mL Oral BID BM   amiodarone   200 mg Oral Daily   apixaban   2.5 mg Oral BID   atorvastatin   40 mg Oral Daily   empagliflozin   10 mg Oral Daily   feeding supplement  237 mL Oral BID BM   furosemide   40 mg Oral Daily   guaiFENesin   600 mg Oral BID   isosorbide  mononitrate  30 mg Oral Daily   metoprolol  tartrate  25 mg Oral BID   multivitamin with minerals  1 tablet Oral Daily   pantoprazole   40 mg Oral Daily   senna-docusate  1 tablet Oral QHS   sodium chloride  flush  3 mL Intravenous Q12H   sodium chloride  flush  3 mL Intravenous Q12H   zinc  sulfate (50mg  elemental zinc )  220 mg Oral Daily   Continuous Infusions: PRN Meds:.acetaminophen  **OR** acetaminophen , albuterol , alum & mag hydroxide-simeth, loratadine , menthol , ondansetron  **OR** ondansetron  (ZOFRAN ) IV, polyethylene glycol, sodium chloride  flush, sodium chloride  flush  Current Outpatient Medications  Medication Instructions   acetaminophen  (TYLENOL ) 650 mg, Oral, Every 6 hours PRN   alum & mag hydroxide-simeth (MAALOX/MYLANTA) 200-200-20  MG/5ML suspension 30 mLs, Oral, Every 4 hours PRN   amiodarone  (PACERONE ) 200 mg, Oral, Daily   apixaban  (ELIQUIS ) 2.5 mg, Oral, 2 times daily   cyanocobalamin  (VITAMIN B12) 500 mcg, Daily   empagliflozin  (JARDIANCE ) 10 mg, Oral, Daily   ipratropium  (ATROVENT ) 0.06 % nasal spray 2 sprays, 2 times daily PRN   isosorbide  mononitrate (IMDUR ) 30 mg, Oral, Daily   ketoconazole  (NIZORAL ) 2 % cream 1 Application, Topical, 2 times daily PRN   loratadine  (CLARITIN ) 10 mg, Daily PRN   losartan  (COZAAR ) 50 mg, Oral, Daily   megestrol  (MEGACE ) 40 mg, Oral, Every morning   metoprolol  tartrate (LOPRESSOR ) 25 mg, Oral, 2 times daily   mirtazapine  (REMERON ) 15 mg, Oral, Daily at bedtime   Multiple Vitamin (MULTIVITAMIN WITH MINERALS) TABS tablet 1 tablet, Oral, Daily   nitroGLYCERIN  (NITROSTAT ) 0.4 mg, Sublingual, Every 5 min PRN   Nutritional Supplements (BOOST/FIBER PO) 1 Bottle, 3 times daily   pantoprazole  (PROTONIX ) 40 mg, Oral, Daily   senna-docusate (SENOKOT-S) 8.6-50 MG tablet 1 tablet, Oral, 2 times daily   sucralfate  (CARAFATE ) 1 g, Oral, 2 times daily   triamcinolone  (NASACORT) 55 MCG/ACT nasal inhaler 1 spray, Daily PRN    Diet Orders (From admission, onward)     Start     Ordered   10/11/24 1514  Diet Heart Room service appropriate? Yes; Fluid consistency: Thin  Diet effective now       Question Answer Comment  Room service appropriate? Yes   Fluid consistency: Thin      10/11/24 1513            DVT prophylaxis: apixaban  (ELIQUIS ) tablet 2.5 mg Start: 10/12/24 0800 apixaban  (ELIQUIS ) tablet 2.5 mg   Lab Results  Component Value Date   PLT 248 10/17/2024      Code Status: Full Code  Family Communication: Will call the son later  Status is: Inpatient Remains inpatient appropriate because: Disposition issues  Level of care: Progressive  Consultants:  Cardiology, now signed off  Objective: Vitals:   10/19/24 2304 10/20/24 0328 10/20/24 0751 10/20/24 1110  BP: 137/65 119/60 (!) 161/78 138/60  Pulse: 60 60  61  Resp: 16 19 14 18   Temp: 98.1 F (36.7 C) 97.6 F (36.4 C) 98.2 F (36.8 C) 98.1 F (36.7 C)  TempSrc: Oral Oral Oral Oral  SpO2: 97% 96% 99% 97%  Weight:  57.2 kg    Height:         Intake/Output Summary (Last 24 hours) at 10/20/2024 1128 Last data filed at 10/20/2024 0754 Gross per 24 hour  Intake 360 ml  Output 750 ml  Net -390 ml   Wt Readings from Last 3 Encounters:  10/20/24 57.2 kg  10/05/24 59 kg  08/02/24 54.2 kg    Examination: Constitutional: NAD Eyes: no scleral icterus ENMT: Mucous membranes are moist.  Neck: normal, supple Respiratory: clear to auscultation bilaterally, no wheezing, no crackles.  Cardiovascular: Regular rate and rhythm, no murmurs / rubs / gallops. No LE edema.  Abdomen: non distended, no tenderness. Bowel sounds positive.  Musculoskeletal: no clubbing / cyanosis.   Data Reviewed: I have independently reviewed following labs and imaging studies   CBC Recent Labs  Lab 10/14/24 0251 10/17/24 0248  WBC 3.7* 5.8  HGB 13.4 13.9  HCT 41.9 43.1  PLT 310 248  MCV 88.4 89.0  MCH 28.3 28.7  MCHC 32.0 32.3  RDW 17.8* 17.4*  LYMPHSABS 0.9  --   MONOABS 0.4  --  EOSABS 0.2  --   BASOSABS 0.0  --     Recent Labs  Lab 10/14/24 0251 10/15/24 0223 10/17/24 0248  NA 136 138 139  K 3.9 4.5 4.7  CL 102 102 102  CO2 28 27 26   GLUCOSE 111* 79 93  BUN 23 34* 40*  CREATININE 1.18* 1.55* 1.60*  CALCIUM  10.0 10.4* 10.8*  AST 28  --  39  ALT 18  --  19  ALKPHOS 126  --  135*  BILITOT 0.7  --  0.7  ALBUMIN 2.4*  --  2.7*  MG 1.8  --   --     ------------------------------------------------------------------------------------------------------------------ No results for input(s): CHOL, HDL, LDLCALC, TRIG, CHOLHDL, LDLDIRECT in the last 72 hours.  Lab Results  Component Value Date   HGBA1C 5.8 10/05/2024   ------------------------------------------------------------------------------------------------------------------ No results for input(s): TSH, T4TOTAL, T3FREE, THYROIDAB in the last 72 hours.  Invalid input(s): FREET3  Cardiac Enzymes No results for input(s): CKMB, TROPONINI,  MYOGLOBIN in the last 168 hours.  Invalid input(s): CK ------------------------------------------------------------------------------------------------------------------    Component Value Date/Time   BNP 960.0 (H) 10/13/2024 0404   CBG: No results for input(s): GLUCAP in the last 168 hours.  No results found for this or any previous visit (from the past 240 hours).   Radiology Studies: No results found.  Nilda Fendt, MD, PhD [facility]  Between 7 am - 7 pm I am available, please contact me via Amion (for emergencies) or Securechat (non urgent messages)  Between 7 pm - 7 am I am not available, please contact night coverage MD/APP via Amion

## 2024-10-20 NOTE — Progress Notes (Addendum)
 Physical Therapy Treatment Patient Details Name: Barbara [name] MRN: [id number] DOB: 11-Aug-1941 Today's Date: 10/20/2024   History of Present Illness 83 yo female adm 12/6 with SOB and chest pain. Admitted for NSTEMI and acute CHF exacerbation. PMH includes: CAD, HFpEF, A-fib s/p PPM, essential HTN, HLD, anxiety, CKD, anemia, Rt TSA, vertigo, Lt femur fx    PT Comments  Pt resting in bed on arrival, agreeable to session to and demonstrating steady progress towards acute goals. Pt continues to be limited by BLE weakness, myoclonic jerking with LE buckling, decreased activity tolerance and impaired balance/postural reactions. Pt requiring up to mod A +2 with RW for support, with close chair follow, during ambulation to maintain balance due to bil knee buckling. Pt continues to require mod A to power up to stand from standard height surfaces due. Pt agreeable to time up in chair at end of session with NT present in room. Patient will benefit from continued inpatient follow up therapy, <3 hours/day to address deficits and maximize functional independence and safety. Pt continues to benefit from skilled PT services to progress toward functional mobility goals.     If plan is discharge home, recommend the following: A little help with walking and/or transfers;A little help with bathing/dressing/bathroom;Assistance with cooking/housework;Assist for transportation;Help with stairs or ramp for entrance   Can travel by private vehicle     No  Equipment Recommendations  None recommended by PT    Recommendations for Other Services       Precautions / Restrictions Precautions Precautions: Fall Recall of Precautions/Restrictions: Intact Restrictions Weight Bearing Restrictions Per Provider Order: No     Mobility  Bed Mobility Overal bed mobility: Needs Assistance Bed Mobility: Supine to Sit     Supine to sit: Min assist     General bed mobility comments: with cuing pt is able to  navigate LE off bed, use L arm to pull across body to bring hips to EoB and min A for bringing her trunk to upright    Transfers Overall transfer level: Needs assistance Equipment used: Rolling walker (2 wheels) Transfers: Sit to/from Stand             General transfer comment: mod A from EOB at lowest height and from low recliner    Ambulation/Gait Ambulation/Gait assistance: Min assist, Mod assist, +2 safety/equipment Gait Distance (Feet): 25 Feet (+ 15') Assistive device: Rolling walker (2 wheels) Gait Pattern/deviations: Step-to pattern, Decreased step length - right, Decreased step length - left, Trunk flexed, Wide base of support Gait velocity: decreased     General Gait Details: pt modA with close chair follow with onset of increasing B knee/ buckling, (more myoclonic jerking through UEs and LEs) requires seated rest break due to increased frequency amd fatigue   Stairs             Wheelchair Mobility     Tilt Bed    Modified Rankin (Stroke Patients Only)       Balance Overall balance assessment: Needs assistance Sitting-balance support: No upper extremity supported, Feet supported Sitting balance-Leahy Scale: Good Sitting balance - Comments: seated EOB   Standing balance support: Bilateral upper extremity supported, During functional activity Standing balance-Leahy Scale: Poor Standing balance comment: reliant on UE support                            Communication Communication Communication: No apparent difficulties  Cognition Arousal: Alert Behavior During Therapy: [facility] for tasks  assessed/performed   PT - Cognitive impairments: No family/caregiver present to determine baseline, Initiation, Sequencing, Problem solving, Safety/Judgement                         Following commands: Impaired      Cueing Cueing Techniques: Verbal cues  Exercises      General Comments General comments (skin integrity, edema, etc.): VSS on  RA      Pertinent Vitals/Pain Pain Assessment Pain Assessment: Faces Faces Pain Scale: Hurts a little bit Pain Location: bil ankles Pain Descriptors / Indicators: Aching Pain Intervention(s): Monitored during session, Limited activity within patient's tolerance    Home Living                          Prior Function            PT Goals (current goals can now be found in the care plan section) Acute Rehab PT Goals Patient Stated Goal: to improve strength and go home Progress towards PT goals: Progressing toward goals    Frequency    Min 2X/week      PT Plan      Co-evaluation              AM-PAC PT 6 Clicks Mobility   Outcome Measure  Help needed turning from your back to your side while in a flat bed without using bedrails?: A Little Help needed moving from lying on your back to sitting on the side of a flat bed without using bedrails?: A Little Help needed moving to and from a bed to a chair (including a wheelchair)?: A Lot Help needed standing up from a chair using your arms (e.g., wheelchair or bedside chair)?: A Lot Help needed to walk in hospital room?: A Lot Help needed climbing 3-5 steps with a railing? : A Lot 6 Click Score: 14    End of Session Equipment Utilized During Treatment: Gait belt Activity Tolerance: Treatment limited secondary to medical complications (Comment);Patient tolerated treatment well (myoclonic jerking LE>UE) Patient left: with call bell/phone within reach;in chair;with nursing/sitter in room Nurse Communication: Mobility status PT Visit Diagnosis: Unsteadiness on feet (R26.81);Other abnormalities of gait and mobility (R26.89);Repeated falls (R29.6);Muscle weakness (generalized) (M62.81)     Time: 8891-8861 PT Time Calculation (min) (ACUTE ONLY): 30 min  Charges:    $Gait Training: 8-22 mins $Therapeutic Activity: 8-22 mins PT General Charges $$ ACUTE PT VISIT: 1 Visit                     Jacquelyn Antony R.  PTA Acute Rehabilitation Services Office: [phone]   Therisa CHRISTELLA Boor 10/20/2024, 11:56 AM

## 2024-10-20 NOTE — TOC Progression Note (Signed)
 Transition of Care [facility]) - Progression Note    Patient Details  Name: DREA JUREWICZ MRN: [id number] Date of Birth: [birth year]  [facility]) CM/SW Contact  Roxie KANDICE Stain, RN Phone Number: 10/20/2024, 4:15 PM  Clinical Narrative:     Spoke to patient's son, Jerod, by phone in patient's room regarding transition needs.   Jerod wants patient to use the 20hrs of custodial care through [facility] at discharge.  This RNCM informed Sallee that patient will need 24/7 caregivers.   Jerod states he will reach out to a cousin  in [place] to see if she can assist.  Sallee will reach out to this RNCM with update tomorrow.  Jerod doesn't want to use [facility] again. Jerod is agreeable to use [facility].     Jerod states there is a clinical research associate involved that is assisting with medicaid approval. Patient has some land that needs to be sold.  Expected Discharge Plan: Home w Home Health Services Barriers to Discharge: Continued Medical Work up, Other (must enter comment) (Disposition planning)               Expected Discharge Plan and Services In-house Referral: Clinical Social Work   Post Acute Care Choice: Skilled Nursing Facility Living arrangements for the past 2 months: Single Family Home                                       Social Drivers of Health (SDOH) Interventions SDOH Screenings   Food Insecurity: No Food Insecurity (10/09/2024)  Housing: High Risk (06/20/2024)  Transportation Needs: Unmet Transportation Needs (06/20/2024)  Utilities: Not At Risk (10/09/2024)  Alcohol Screen: Low Risk (08/05/2022)  Depression (PHQ2-9): Low Risk (04/05/2024)  Financial Resource Strain: Low Risk (08/05/2022)  Physical Activity: Insufficiently Active (07/02/2022)  Social Connections: Moderately Integrated (06/20/2024)  Stress: No Stress Concern Present (07/02/2022)  Tobacco Use: Medium Risk (10/09/2024)    Readmission Risk Interventions    05/24/2024    8:57 AM  03/11/2024    1:07 PM  Readmission Risk Prevention Plan  Transportation Screening Complete Complete  PCP or Specialist Appt within 5-7 Days  Complete  Home Care Screening  Complete  Medication Review (RN CM)  Complete  HRI or Home Care Consult Complete   Social Work Consult for Recovery Care Planning/Counseling Complete   Palliative Care Screening Not Applicable   Medication Review Oceanographer) Complete

## 2024-10-20 NOTE — Progress Notes (Signed)
 Mobility Specialist Progress Note:   10/20/24 1406  Mobility  Activity Pivoted/transferred from chair to bed  Level of Assistance Minimal assist, patient does 75% or more (+2)  Assistive Device Front wheel walker  Distance Ambulated (ft) 2 ft  Activity Response Tolerated well  Mobility Referral Yes  Mobility visit 1 Mobility  Mobility Specialist Start Time (ACUTE ONLY) 1300  Mobility Specialist Stop Time (ACUTE ONLY) 1312  Mobility Specialist Time Calculation (min) (ACUTE ONLY) 12 min   Pt received in chair requesting assistance back to bed. Attempted to get pt up w/ x1 assist but d/t pt unable to clear hips from chair pt required MinA +2 for STS. Once standing ot was able to take a couple steps towards the bed w/o fault. Left in bed w/ call bell and personal belongings in reach. All needs met.   Thersia Minder Mobility Specialist  Please contact vis Secure Chat or  Rehab Office [phone]

## 2024-10-21 DIAGNOSIS — I200 Unstable angina: Secondary | ICD-10-CM | POA: Diagnosis not present

## 2024-10-21 NOTE — TOC Progression Note (Signed)
 Transition of Care [facility]) - Progression Note    Patient Details  Name: Barbara [name] MRN: [id number] Date of Birth: 10/16/1941  Transition [facility] Care [facility]) CM/SW Contact  Lauraine FORBES Saa, LCSWA Phone Number: 10/21/2024, 2:36 PM  Clinical Narrative:     2:36 PM RNCM informed CSW of patient's son, Sallee, interest in private paying for SNF. CSW provided Jerod with private pay cost for SNFs. TOC will continue to follow.  Expected Discharge Plan: Skilled Nursing Facility Barriers to Discharge: Continued Medical Work up, Other (must enter comment) (Disposition planning)               Expected Discharge Plan and Services In-house Referral: Clinical Social Work   Post Acute Care Choice: Skilled Nursing Facility Living arrangements for the past 2 months: Single Family Home                                       Social Drivers of Health (SDOH) Interventions SDOH Screenings   Food Insecurity: No Food Insecurity (10/09/2024)  Housing: High Risk (06/20/2024)  Transportation Needs: Unmet Transportation Needs (06/20/2024)  Utilities: Not At Risk (10/09/2024)  Alcohol Screen: Low Risk (08/05/2022)  Depression (PHQ2-9): Low Risk (04/05/2024)  Financial Resource Strain: Low Risk (08/05/2022)  Physical Activity: Insufficiently Active (07/02/2022)  Social Connections: Moderately Integrated (06/20/2024)  Stress: No Stress Concern Present (07/02/2022)  Tobacco Use: Medium Risk (10/09/2024)    Readmission Risk Interventions    05/24/2024    8:57 AM 03/11/2024    1:07 PM  Readmission Risk Prevention Plan  Transportation Screening Complete Complete  PCP or Specialist Appt within 5-7 Days  Complete  Home Care Screening  Complete  Medication Review (RN CM)  Complete  HRI or Home Care Consult Complete   Social Work Consult for Recovery Care Planning/Counseling Complete   Palliative Care Screening Not Applicable   Medication Review Oceanographer) Complete

## 2024-10-21 NOTE — Progress Notes (Signed)
 Occupational Therapy Treatment Patient Details Name: Barbara [name] MRN: [id number] DOB: 07-09-1941 Today's Date: 10/21/2024   History of present illness 83 yo female adm 12/6 with SOB and chest pain. Admitted for NSTEMI and acute CHF exacerbation. PMH includes: CAD, HFpEF, A-fib s/p PPM, essential HTN, HLD, anxiety, CKD, anemia, Rt TSA, vertigo, Lt femur fx   OT comments  Pt currently at min guard assist for functional mobility during session with use of the RW for support.  Min assist for LB dressing tasks with min guard to min assist for oral hygiene and washing face while standing at the sink.  Oxygen sats above 97% on room air throughout activity with HR ranging from 66 BPM up to 75 BPM.  Dyspnea 2/4.  Feel pt is making steady progress with OT and will continue to benefit from acute care treatment.  Recommend Patient will benefit from continued inpatient follow up therapy, <3 hours/day post acute stay as pt lives alone and does not have consistent supervision and with recent history of falls.         If plan is discharge home, recommend the following:  A little help with walking and/or transfers;Assistance with cooking/housework;Direct supervision/assist for medications management;Direct supervision/assist for financial management;Assist for transportation;Help with stairs or ramp for entrance;A little help with bathing/dressing/bathroom   Equipment Recommendations  Other (comment) (TBD next venue of care)       Precautions / Restrictions Precautions Precautions: Fall Recall of Precautions/Restrictions: Intact Restrictions Weight Bearing Restrictions Per Provider Order: No       Mobility Bed Mobility Overal bed mobility: Needs Assistance Bed Mobility: Supine to Sit     Supine to sit: Min assist     General bed mobility comments: Assist with scooting to the EOB once LEs were off of the bed.    Transfers Overall transfer level: Needs assistance Equipment used:  Rolling walker (2 wheels) Transfers: Sit to/from Stand Sit to Stand: Contact guard assist           General transfer comment: Min guard from EOB and from straight back chair with min instructional cueing to scoot to the EOB.     Balance Overall balance assessment: Needs assistance Sitting-balance support: No upper extremity supported, Feet supported Sitting balance-Leahy Scale: Good     Standing balance support: Bilateral upper extremity supported, During functional activity Standing balance-Leahy Scale: Poor Standing balance comment: Pt needs BUE support on RW or stable surface.                           ADL either performed or assessed with clinical judgement   ADL Overall ADL's : Needs assistance/impaired     Grooming: Contact guard assist;Standing;Oral care           Upper Body Dressing : Minimal assistance;Sitting Upper Body Dressing Details (indicate cue type and reason): gown as a robe Lower Body Dressing: Minimal assistance Lower Body Dressing Details (indicate cue type and reason): donn new mesh underpants and pad             Functional mobility during ADLs: Rolling walker (2 wheels);Contact guard assist General ADL Comments: Pt's vitals stable throughout session.  Min guard for most mobility with use of the RW with occassional knee buckling on the right but pt able to correct without therapist assist.              Communication Communication Communication: No apparent difficulties   Cognition Arousal: Alert Behavior During  Therapy: WFL for tasks assessed/performed Cognition: No family/caregiver present to determine baseline             OT - Cognition Comments: Pt not oriented to day of the week reported Tuesday.                 Following commands: Intact Following commands impaired: Only follows one step commands consistently, Follows multi-step commands with increased time      Cueing   Cueing Techniques: Verbal cues,  Gestural cues             Pertinent Vitals/ Pain       Pain Assessment Pain Assessment: No/denies pain Faces Pain Scale: No hurt         Frequency  Min 2X/week        Progress Toward Goals  OT Goals(current goals can now be found in the care plan section)  Progress towards OT goals: Progressing toward goals  Acute Rehab OT Goals Patient Stated Goal: Pt wants to go home for Christmas but understands she may need rehab secondary to living alone OT Goal Formulation: With patient Time For Goal Achievement: 10/25/24 Potential to Achieve Goals: Good  Plan         AM-PAC OT 6 Clicks Daily Activity     Outcome Measure   Help from another person eating meals?: None Help from another person taking care of personal grooming?: A Little Help from another person toileting, which includes using toliet, bedpan, or urinal?: A Little Help from another person bathing (including washing, rinsing, drying)?: A Little Help from another person to put on and taking off regular upper body clothing?: A Little Help from another person to put on and taking off regular lower body clothing?: A Little 6 Click Score: 19    End of Session Equipment Utilized During Treatment: Rolling walker (2 wheels);Gait belt  OT Visit Diagnosis: Unsteadiness on feet (R26.81);Other abnormalities of gait and mobility (R26.89);Muscle weakness (generalized) (M62.81);History of falling (Z91.81)   Activity Tolerance Patient tolerated treatment well   Patient Left with call bell/phone within reach;in chair;Other (comment) (chair alarm pad in chair but no alarm present.  NT made aware)   Nurse Communication Mobility status        Time: 9067-8981 OT Time Calculation (min): 46 min  Charges: OT General Charges $OT Visit: 1 Visit OT Treatments $Self Care/Home Management : 38-52 mins  Lynwood Constant, OTR/L Acute Rehabilitation Services  Office [phone] 10/21/2024

## 2024-10-21 NOTE — Progress Notes (Signed)
 PROGRESS NOTE  Barbara [name] FMW:[id number] DOB: [birth year] DOA: 10/09/2024 PCP: Johnny Garnette LABOR, MD   LOS: 11 days   Brief Narrative / Interim history: 83 year old female with history of CAD, PAF on Eliquis , HTN, HLD, CKD 3a, sick sinus syndrome status post pacemaker who comes into the hospital with chest pain.  Cardiology was consulted on admission, underwent left heart cardiac cath with patent proximal LAD stent with minimal restenosis, mild nonobstructive disease in the mid circumflex and mild nonobstructive disease in the mid RCA, recommending medical management.  SNF was recommended, however insurance is refusing to approve.  Decision was appealed and insurance declined again despite medical team recommendations.  Subjective / 24h Interval events: No chest pain, no shortness of breath.  She is actually working with occupational therapy when I entered the room, is able to take a few steps with a walker but then the knees buckle underneath her.  Needs a lot of assistant  Assesement and Plan: Principal problem Chest pain, nonobstructive CAD -cardiology consulted and evaluated patient while hospitalized.  She underwent left heart cardiac cath with patent proximal LAD stent with minimal restenosis, mild nonobstructive disease in the mid circumflex and mild nonobstructive disease in the mid RCA, recommending medical management. - Continue statin, Imdur , metoprolol .  No further chest pain  Active problems Acute on chronic diastolic CHF -echocardiogram showed EF of 60 to 65%, apical hypertrophic cardiomyopathy, grade 2 diastolic dysfunction.  She was initially fluid overloaded requiring diuresis with IV Lasix , currently she is on furosemide  40 mg daily p.o., tolerating it well.  Appears euvolemic - Repeat labs tomorrow morning  CKD 3B -baseline creatinine over 2025 appears to range between 1.2 and 1.6.  Currently she appears close to baseline.  Repeat labs in the morning  PAF -continue  amiodarone , metoprolol , she is anticoagulated with Eliquis   SSS status post pacemaker-remains paced on telemetry  Hypophosphatemia-continue to monitor and replenish as indicated  AGMA -resolved  Disposition -to be determined, overall difficult as she lives by herself and insurance has denied her to go to SNF.  She is quite unsteady and based on most recent evaluation requires 2 assist, unsteady even with a walker and bilateral knees buckling.  Case manager working with the family to see what can be arranged in an outpatient setting, but ideally she should have 24/7 care  Scheduled Meds:  (feeding supplement) PROSource Plus  30 mL Oral BID BM   amiodarone   200 mg Oral Daily   apixaban   2.5 mg Oral BID   atorvastatin   40 mg Oral Daily   empagliflozin   10 mg Oral Daily   feeding supplement  237 mL Oral BID BM   furosemide   40 mg Oral Daily   guaiFENesin   600 mg Oral BID   isosorbide  mononitrate  30 mg Oral Daily   metoprolol  tartrate  25 mg Oral BID   multivitamin with minerals  1 tablet Oral Daily   pantoprazole   40 mg Oral Daily   senna-docusate  1 tablet Oral QHS   sodium chloride  flush  3 mL Intravenous Q12H   sodium chloride  flush  3 mL Intravenous Q12H   zinc  sulfate (50mg  elemental zinc )  220 mg Oral Daily   Continuous Infusions: PRN Meds:.acetaminophen  **OR** acetaminophen , albuterol , alum & mag hydroxide-simeth, loratadine , menthol , ondansetron  **OR** ondansetron  (ZOFRAN ) IV, polyethylene glycol, sodium chloride  flush, sodium chloride  flush  Current Outpatient Medications  Medication Instructions   acetaminophen  (TYLENOL ) 650 mg, Oral, Every 6 hours PRN   alum & mag  hydroxide-simeth (MAALOX/MYLANTA) 200-200-20 MG/5ML suspension 30 mLs, Oral, Every 4 hours PRN   amiodarone  (PACERONE ) 200 mg, Oral, Daily   apixaban  (ELIQUIS ) 2.5 mg, Oral, 2 times daily   cyanocobalamin  (VITAMIN B12) 500 mcg, Daily   empagliflozin  (JARDIANCE ) 10 mg, Oral, Daily   ipratropium (ATROVENT ) 0.06  % nasal spray 2 sprays, 2 times daily PRN   isosorbide  mononitrate (IMDUR ) 30 mg, Oral, Daily   ketoconazole  (NIZORAL ) 2 % cream 1 Application, Topical, 2 times daily PRN   loratadine  (CLARITIN ) 10 mg, Daily PRN   losartan  (COZAAR ) 50 mg, Oral, Daily   megestrol  (MEGACE ) 40 mg, Oral, Every morning   metoprolol  tartrate (LOPRESSOR ) 25 mg, Oral, 2 times daily   mirtazapine  (REMERON ) 15 mg, Oral, Daily at bedtime   Multiple Vitamin (MULTIVITAMIN WITH MINERALS) TABS tablet 1 tablet, Oral, Daily   nitroGLYCERIN  (NITROSTAT ) 0.4 mg, Sublingual, Every 5 min PRN   Nutritional Supplements (BOOST/FIBER PO) 1 Bottle, 3 times daily   pantoprazole  (PROTONIX ) 40 mg, Oral, Daily   senna-docusate (SENOKOT-S) 8.6-50 MG tablet 1 tablet, Oral, 2 times daily   sucralfate  (CARAFATE ) 1 g, Oral, 2 times daily   triamcinolone  (NASACORT) 55 MCG/ACT nasal inhaler 1 spray, Daily PRN    Diet Orders (From admission, onward)     Start     Ordered   10/11/24 1514  Diet Heart Room service appropriate? Yes; Fluid consistency: Thin  Diet effective now       Question Answer Comment  Room service appropriate? Yes   Fluid consistency: Thin      10/11/24 1513            DVT prophylaxis: apixaban  (ELIQUIS ) tablet 2.5 mg Start: 10/12/24 0800 apixaban  (ELIQUIS ) tablet 2.5 mg   Lab Results  Component Value Date   PLT 248 10/17/2024      Code Status: Full Code  Family Communication: Discussed with son over the phone 12/17  Status is: Inpatient Remains inpatient appropriate because: Disposition issues  Level of care: Progressive  Consultants:  Cardiology, now signed off  Objective: Vitals:   10/20/24 2059 10/20/24 2238 10/21/24 0401 10/21/24 0735  BP: 138/67 139/70 (!) 148/61 (!) 161/72  Pulse: 60 60 60 60  Resp: 18 20 15 18   Temp: 97.7 F (36.5 C) 98.1 F (36.7 C) 98 F (36.7 C) 98.6 F (37 C)  TempSrc: Oral Oral Oral Oral  SpO2: 98% 98% 100% 99%  Weight:   58.7 kg   Height:         Intake/Output Summary (Last 24 hours) at 10/21/2024 1014 Last data filed at 10/21/2024 0700 Gross per 24 hour  Intake 474 ml  Output 700 ml  Net -226 ml   Wt Readings from Last 3 Encounters:  10/21/24 58.7 kg  10/05/24 59 kg  08/02/24 54.2 kg    Examination: Constitutional: NAD Eyes: lids and conjunctivae normal, no scleral icterus ENMT: mmm Neck: normal, supple Respiratory: clear to auscultation bilaterally, no wheezing, no crackles. Normal respiratory effort.  Cardiovascular: Regular rate and rhythm, no murmurs / rubs / gallops. No LE edema. Abdomen: soft, no distention, no tenderness. Bowel sounds positive.   Data Reviewed: I have independently reviewed following labs and imaging studies   CBC Recent Labs  Lab 10/17/24 0248  WBC 5.8  HGB 13.9  HCT 43.1  PLT 248  MCV 89.0  MCH 28.7  MCHC 32.3  RDW 17.4*    Recent Labs  Lab 10/15/24 0223 10/17/24 0248  NA 138 139  K 4.5 4.7  CL 102 102  CO2 27 26  GLUCOSE 79 93  BUN 34* 40*  CREATININE 1.55* 1.60*  CALCIUM  10.4* 10.8*  AST  --  39  ALT  --  19  ALKPHOS  --  135*  BILITOT  --  0.7  ALBUMIN  --  2.7*    ------------------------------------------------------------------------------------------------------------------ No results for input(s): CHOL, HDL, LDLCALC, TRIG, CHOLHDL, LDLDIRECT in the last 72 hours.  Lab Results  Component Value Date   HGBA1C 5.8 10/05/2024   ------------------------------------------------------------------------------------------------------------------ No results for input(s): TSH, T4TOTAL, T3FREE, THYROIDAB in the last 72 hours.  Invalid input(s): FREET3  Cardiac Enzymes No results for input(s): CKMB, TROPONINI, MYOGLOBIN in the last 168 hours.  Invalid input(s): CK ------------------------------------------------------------------------------------------------------------------    Component Value Date/Time   BNP 960.0 (H)  10/13/2024 0404   CBG: No results for input(s): GLUCAP in the last 168 hours.  No results found for this or any previous visit (from the past 240 hours).   Radiology Studies: No results found.  Nilda Fendt, MD, PhD [facility]  Between 7 am - 7 pm I am available, please contact me via Amion (for emergencies) or Securechat (non urgent messages)  Between 7 pm - 7 am I am not available, please contact night coverage MD/APP via Amion

## 2024-10-21 NOTE — Progress Notes (Signed)
 Mobility Specialist Progress Note:    10/21/24 1213  Mobility  Activity Ambulated with assistance  Level of Assistance Moderate assist, patient does 50-74%  Assistive Device Front wheel walker  Distance Ambulated (ft) 76 ft  Activity Response Tolerated well  Mobility Referral Yes  Mobility visit 1 Mobility  Mobility Specialist Start Time (ACUTE ONLY) 1140  Mobility Specialist Stop Time (ACUTE ONLY) 1157  Mobility Specialist Time Calculation (min) (ACUTE ONLY) 17 min   Pt received in chair hesitant but agreeable to mobility. Pt required ModA for STS and MinA during ambulation d/t minor knee buckling episodes. No c/o throughout. Returned to room w/o fault. Left in bed w/ call bell and personal belongings in reach. All needs met. Bed alarm on.  Thersia Minder Mobility Specialist  Please contact vis Secure Chat or  Rehab Office [phone]

## 2024-10-21 NOTE — Plan of Care (Signed)

## 2024-10-22 DIAGNOSIS — I200 Unstable angina: Secondary | ICD-10-CM | POA: Diagnosis not present

## 2024-10-22 LAB — BASIC METABOLIC PANEL WITH GFR
Anion gap: 9 (ref 5–15)
BUN: 33 mg/dL — ABNORMAL HIGH (ref 8–23)
CO2: 30 mmol/L (ref 22–32)
Calcium: 11.4 mg/dL — ABNORMAL HIGH (ref 8.9–10.3)
Chloride: 101 mmol/L (ref 98–111)
Creatinine, Ser: 1.41 mg/dL — ABNORMAL HIGH (ref 0.44–1.00)
GFR, Estimated: 36 mL/min — ABNORMAL LOW
Glucose, Bld: 81 mg/dL (ref 70–99)
Potassium: 3.7 mmol/L (ref 3.5–5.1)
Sodium: 141 mmol/L (ref 135–145)

## 2024-10-22 LAB — CBC
HCT: 44.7 % (ref 36.0–46.0)
Hemoglobin: 14.3 g/dL (ref 12.0–15.0)
MCH: 28.8 pg (ref 26.0–34.0)
MCHC: 32 g/dL (ref 30.0–36.0)
MCV: 89.9 fL (ref 80.0–100.0)
Platelets: 290 K/uL (ref 150–400)
RBC: 4.97 MIL/uL (ref 3.87–5.11)
RDW: 16.9 % — ABNORMAL HIGH (ref 11.5–15.5)
WBC: 5.3 K/uL (ref 4.0–10.5)
nRBC: 0 % (ref 0.0–0.2)

## 2024-10-22 LAB — MAGNESIUM: Magnesium: 2.3 mg/dL (ref 1.7–2.4)

## 2024-10-22 NOTE — Plan of Care (Signed)
" °  Problem: Education: Goal: Knowledge of General Education information will improve Description: Including pain rating scale, medication(s)/side effects and non-pharmacologic comfort measures Outcome: Progressing   Problem: Clinical Measurements: Goal: Respiratory complications will improve Outcome: Progressing Goal: Cardiovascular complication will be avoided Outcome: Progressing   Problem: Coping: Goal: Level of anxiety will decrease Outcome: Progressing   Problem: Elimination: Goal: Will not experience complications related to bowel motility Outcome: Progressing Goal: Will not experience complications related to urinary retention Outcome: Progressing   Problem: Pain Managment: Goal: General experience of comfort will improve and/or be controlled Outcome: Progressing   "

## 2024-10-22 NOTE — Plan of Care (Signed)
  Problem: Education: Goal: Knowledge of General Education information will improve Description: Including pain rating scale, medication(s)/side effects and non-pharmacologic comfort measures Outcome: Progressing   Problem: Clinical Measurements: Goal: Will remain free from infection Outcome: Progressing   Problem: Activity: Goal: Risk for activity intolerance will decrease Outcome: Progressing   Problem: Nutrition: Goal: Adequate nutrition will be maintained Outcome: Progressing   Problem: Skin Integrity: Goal: Risk for impaired skin integrity will decrease Outcome: Progressing

## 2024-10-22 NOTE — Progress Notes (Signed)
 Mobility Specialist Progress Note:    10/22/24 1400  Mobility  Activity Pivoted/transferred from chair to bed  Level of Assistance Minimal assist, patient does 75% or more  Assistive Device Front wheel walker  Distance Ambulated (ft) 2 ft  Activity Response Tolerated well  Mobility Referral Yes  Mobility visit 1 Mobility  Mobility Specialist Start Time (ACUTE ONLY) 1335  Mobility Specialist Stop Time (ACUTE ONLY) 1345  Mobility Specialist Time Calculation (min) (ACUTE ONLY) 10 min   Pt received in chair requesting assistance back to bed. Required MinA for STS and contact guard during ambulation. No c/o throughout. Situated in bed w/ call bell and personal belongings in reach. All needs met.  Thersia Minder Mobility Specialist  Please contact vis Secure Chat or  Rehab Office [phone]

## 2024-10-22 NOTE — TOC Progression Note (Addendum)
 Transition of Care [facility]) - Progression Note    Patient Details  Name: Barbara [name] MRN: [id number] Date of Birth: 03/27/1941  Transition of Care [facility]) CM/SW Contact  Luann SHAUNNA [place], [place] Phone Number: 10/22/2024, 10:59 AM  Clinical Narrative:     CSW spoke with pt's son, Sallee, over the phone. He explained family was planning to private pay at SNF though that initial rates he was presented with did not reflect the actual rates when PT/OT services were included. Feels hesitant about SNF due to a lack of clarity on therapy services and cost. Plan now shifting to pt returning home at DC. Son is reaching out to a family member in [place]  to arrange someone staying with the pt. He explained if someone can help care for the pt and they could receive HH through [facility] and PCS services through [place] [employer] [facility] benefit) then that is likely what they would like to do. Son will notify CSW when he receives update from family regarding care giving help. CSW notified RNCM.  Expected Discharge Plan: Home w Home Health Services Barriers to Discharge: Other (must enter comment) (Need to arrange caregivers at home)      Expected Discharge Plan and Services In-house Referral: Clinical Social Work   Post Acute Care Choice: Skilled Nursing Facility Living arrangements for the past 2 months: Single Family Home                                       Social Drivers of Health (SDOH) Interventions SDOH Screenings   Food Insecurity: No Food Insecurity (10/09/2024)  Housing: High Risk (06/20/2024)  Transportation Needs: Unmet Transportation Needs (06/20/2024)  Utilities: Not At Risk (10/09/2024)  Alcohol Screen: Low Risk (08/05/2022)  Depression (PHQ2-9): Low Risk (04/05/2024)  Financial Resource Strain: Low Risk (08/05/2022)  Physical Activity: Insufficiently Active (07/02/2022)  Social Connections: Moderately Integrated (06/20/2024)  Stress: No Stress Concern Present (07/02/2022)   Tobacco Use: Medium Risk (10/09/2024)    Readmission Risk Interventions    05/24/2024    8:57 AM 03/11/2024    1:07 PM  Readmission Risk Prevention Plan  Transportation Screening Complete Complete  PCP or Specialist Appt within 5-7 Days  Complete  Home Care Screening  Complete  Medication Review (RN CM)  Complete  HRI or Home Care Consult Complete   Social Work Consult for Recovery Care Planning/Counseling Complete   Palliative Care Screening Not Applicable   Medication Review Oceanographer) Complete

## 2024-10-22 NOTE — Progress Notes (Signed)
 Physical Therapy Treatment Patient Details Name: Barbara [name] MRN: [id number] DOB: [birth year] Today's Date: 10/22/2024   History of Present Illness 83 yo female adm 12/6 with SOB and chest pain. Admitted for NSTEMI and acute CHF exacerbation. PMH includes: CAD, HFpEF, A-fib s/p PPM, essential HTN, HLD, anxiety, CKD, anemia, Rt TSA, vertigo, Lt femur fx    PT Comments  Pt resting in bed on arrival, pleasant and agreeable to session and demonstrating steady progress towards acute goals this session. Pt continues to be limited in safe mobility by jerking movements of UE/LE in sitting and standing with knee buckling, placing pt at increased risk for falls as well as weakness, and impaired balance/postural reactions. Pt requiring light min A to complete bed mobility and min A to perform transfers sit<>stand from varying height surfaces. Pt progressing ambulation with RW for support with less knee buckling noted this session, however pt requiring min A to maintain balance, especially with turning and chair follow provided for safety. Pt up in the recliner at end of session with lunch tray set up and all needs met. Current plan remains appropriate to address deficits and maximize functional independence and safety. Pt continues to benefit from skilled PT services to progress toward functional mobility goals.      If plan is discharge home, recommend the following: A little help with walking and/or transfers;A little help with bathing/dressing/bathroom;Assistance with cooking/housework;Assist for transportation;Help with stairs or ramp for entrance   Can travel by private vehicle     No  Equipment Recommendations  None recommended by PT    Recommendations for Other Services       Precautions / Restrictions Precautions Precautions: Fall Recall of Precautions/Restrictions: Intact Restrictions Weight Bearing Restrictions Per Provider Order: No     Mobility  Bed Mobility Overal bed  mobility: Needs Assistance Bed Mobility: Supine to Sit     Supine to sit: Min assist     General bed mobility comments: Assist with scooting to the EOB once LEs were off of the bed.    Transfers Overall transfer level: Needs assistance Equipment used: Rolling walker (2 wheels) Transfers: Sit to/from Stand Sit to Stand: Min assist           General transfer comment: min A to stand from EOB and BSC over low commode    Ambulation/Gait Ambulation/Gait assistance: Min assist, +2 safety/equipment Gait Distance (Feet): 15 Feet (+ 65') Assistive device: Rolling walker (2 wheels) Gait Pattern/deviations: Step-to pattern, Decreased step length - right, Decreased step length - left, Trunk flexed, Wide base of support Gait velocity: decreased     General Gait Details: pt requiring min A to maintain balance with close chair follow due to some contineud jerking movements resulting in knee buckling, noted in UEs as well   Stairs             Wheelchair Mobility     Tilt Bed    Modified Rankin (Stroke Patients Only)       Balance Overall balance assessment: Needs assistance Sitting-balance support: No upper extremity supported, Feet supported Sitting balance-Leahy Scale: Good Sitting balance - Comments: seated EOB   Standing balance support: Bilateral upper extremity supported, During functional activity Standing balance-Leahy Scale: Poor Standing balance comment: Pt needs BUE support on RW or stable surface.                            Communication Communication Communication: No apparent difficulties  Cognition  Arousal: Alert Behavior During Therapy: WFL for tasks assessed/performed   PT - Cognitive impairments: No family/caregiver present to determine baseline, Initiation, Sequencing, Problem solving, Safety/Judgement                         Following commands: Intact Following commands impaired: Only follows one step commands  consistently, Follows multi-step commands with increased time    Cueing Cueing Techniques: Verbal cues, Gestural cues  Exercises      General Comments General comments (skin integrity, edema, etc.): VSS on RA      Pertinent Vitals/Pain Pain Assessment Pain Assessment: No/denies pain Faces Pain Scale: No hurt    Home Living                          Prior Function            PT Goals (current goals can now be found in the care plan section) Acute Rehab PT Goals Patient Stated Goal: to improve strength and go home Progress towards PT goals: Progressing toward goals    Frequency    Min 2X/week      PT Plan      Co-evaluation              AM-PAC PT 6 Clicks Mobility   Outcome Measure  Help needed turning from your back to your side while in a flat bed without using bedrails?: A Little Help needed moving from lying on your back to sitting on the side of a flat bed without using bedrails?: A Little Help needed moving to and from a bed to a chair (including a wheelchair)?: A Little Help needed standing up from a chair using your arms (e.g., wheelchair or bedside chair)?: A Lot Help needed to walk in hospital room?: A Lot Help needed climbing 3-5 steps with a railing? : A Lot 6 Click Score: 15    End of Session Equipment Utilized During Treatment: Gait belt Activity Tolerance: Patient tolerated treatment well Patient left: with call bell/phone within reach;in chair Nurse Communication: Mobility status PT Visit Diagnosis: Unsteadiness on feet (R26.81);Other abnormalities of gait and mobility (R26.89);Repeated falls (R29.6);Muscle weakness (generalized) (M62.81)     Time: 1133-1207 PT Time Calculation (min) (ACUTE ONLY): 34 min  Charges:    $Gait Training: 8-22 mins $Therapeutic Activity: 8-22 mins PT General Charges $$ ACUTE PT VISIT: 1 Visit                     Cecylia Brazill R. PTA Acute Rehabilitation Services Office: [phone]   Therisa CHRISTELLA Boor 10/22/2024, 12:48 PM

## 2024-10-22 NOTE — Progress Notes (Signed)
 " PROGRESS NOTE  Barbara [name] FMW:[id number] DOB: Dec 26, 1940 DOA: 10/09/2024 PCP: Johnny Garnette LABOR, MD   LOS: 12 days   Brief Narrative / Interim history: 83 year old female with history of CAD, PAF on Eliquis , HTN, HLD, CKD 3a, sick sinus syndrome status post pacemaker who comes into the hospital with chest pain.  Cardiology was consulted on admission, underwent left heart cardiac cath with patent proximal LAD stent with minimal restenosis, mild nonobstructive disease in the mid circumflex and mild nonobstructive disease in the mid RCA, recommending medical management.  SNF was recommended, however insurance is refusing to approve.  Decision was appealed and insurance declined again despite medical team recommendations.  Subjective / 24h Interval events: She feels well, denies any chest pain, denies any shortness of breath.  She is hoping to go to rehab  Assesement and Plan: Principal problem Chest pain, nonobstructive CAD -cardiology consulted and evaluated patient while hospitalized.  She underwent left heart cardiac cath with patent proximal LAD stent with minimal restenosis, mild nonobstructive disease in the mid circumflex and mild nonobstructive disease in the mid RCA, recommending medical management. - Continue statin, Imdur , metoprolol .  No further chest pain  Active problems Acute on chronic diastolic CHF -echocardiogram showed EF of 60 to 65%, apical hypertrophic cardiomyopathy, grade 2 diastolic dysfunction.  She was initially fluid overloaded requiring diuresis with IV Lasix , currently she is on furosemide  40 mg daily p.o., tolerating it well.  Appears euvolemic - Repeat labs this morning showed stability, potassium 3.7, magnesium  2.3  CKD 3B -baseline creatinine over 2025 appears to range between 1.2 and 1.6.  Currently she appears close to baseline.  Creatinine this morning 1.4, which is at baseline  PAF -continue amiodarone , metoprolol , she is anticoagulated with  Eliquis   SSS status post pacemaker-remains paced on telemetry  Hypophosphatemia-continue to monitor and replenish as indicated  AGMA -resolved  Disposition -to be determined, overall difficult as she lives by herself and insurance has denied her to go to SNF.  She is quite unsteady and based on most recent evaluation requires 2 assist, unsteady even with a walker and bilateral knees buckling.  Case manager working with the family to see what can be arranged in an outpatient setting, but ideally she should have 24/7 care  Scheduled Meds:  (feeding supplement) PROSource Plus  30 mL Oral BID BM   amiodarone   200 mg Oral Daily   apixaban   2.5 mg Oral BID   atorvastatin   40 mg Oral Daily   empagliflozin   10 mg Oral Daily   feeding supplement  237 mL Oral BID BM   furosemide   40 mg Oral Daily   guaiFENesin   600 mg Oral BID   isosorbide  mononitrate  30 mg Oral Daily   metoprolol  tartrate  25 mg Oral BID   multivitamin with minerals  1 tablet Oral Daily   pantoprazole   40 mg Oral Daily   senna-docusate  1 tablet Oral QHS   sodium chloride  flush  3 mL Intravenous Q12H   sodium chloride  flush  3 mL Intravenous Q12H   zinc  sulfate (50mg  elemental zinc )  220 mg Oral Daily   Continuous Infusions: PRN Meds:.acetaminophen  **OR** acetaminophen , albuterol , alum & mag hydroxide-simeth, loratadine , menthol , ondansetron  **OR** ondansetron  (ZOFRAN ) IV, polyethylene glycol, sodium chloride  flush, sodium chloride  flush  Current Outpatient Medications  Medication Instructions   acetaminophen  (TYLENOL ) 650 mg, Oral, Every 6 hours PRN   alum & mag hydroxide-simeth (MAALOX/MYLANTA) 200-200-20 MG/5ML suspension 30 mLs, Oral, Every 4 hours PRN  amiodarone  (PACERONE ) 200 mg, Oral, Daily   apixaban  (ELIQUIS ) 2.5 mg, Oral, 2 times daily   cyanocobalamin  (VITAMIN B12) 500 mcg, Daily   empagliflozin  (JARDIANCE ) 10 mg, Oral, Daily   ipratropium (ATROVENT ) 0.06 % nasal spray 2 sprays, 2 times daily PRN    isosorbide  mononitrate (IMDUR ) 30 mg, Oral, Daily   ketoconazole  (NIZORAL ) 2 % cream 1 Application, Topical, 2 times daily PRN   loratadine  (CLARITIN ) 10 mg, Daily PRN   losartan  (COZAAR ) 50 mg, Oral, Daily   megestrol  (MEGACE ) 40 mg, Oral, Every morning   metoprolol  tartrate (LOPRESSOR ) 25 mg, Oral, 2 times daily   mirtazapine  (REMERON ) 15 mg, Oral, Daily at bedtime   Multiple Vitamin (MULTIVITAMIN WITH MINERALS) TABS tablet 1 tablet, Oral, Daily   nitroGLYCERIN  (NITROSTAT ) 0.4 mg, Sublingual, Every 5 min PRN   Nutritional Supplements (BOOST/FIBER PO) 1 Bottle, 3 times daily   pantoprazole  (PROTONIX ) 40 mg, Oral, Daily   senna-docusate (SENOKOT-S) 8.6-50 MG tablet 1 tablet, Oral, 2 times daily   sucralfate  (CARAFATE ) 1 g, Oral, 2 times daily   triamcinolone  (NASACORT) 55 MCG/ACT nasal inhaler 1 spray, Daily PRN    Diet Orders (From admission, onward)     Start     Ordered   10/11/24 1514  Diet Heart Room service appropriate? Yes; Fluid consistency: Thin  Diet effective now       Question Answer Comment  Room service appropriate? Yes   Fluid consistency: Thin      10/11/24 1513            DVT prophylaxis: apixaban  (ELIQUIS ) tablet 2.5 mg Start: 10/12/24 0800 apixaban  (ELIQUIS ) tablet 2.5 mg   Lab Results  Component Value Date   PLT 290 10/22/2024      Code Status: Full Code  Family Communication: Discussed with son over the phone 12/17  Status is: Inpatient Remains inpatient appropriate because: Disposition issues  Level of care: Progressive  Consultants:  Cardiology, now signed off  Objective: Vitals:   10/21/24 2119 10/22/24 0004 10/22/24 0453 10/22/24 0732  BP: (!) 132/56 118/72 123/60 (!) 160/66  Pulse:  60 60 60  Resp:  16 20 17   Temp:  97.9 F (36.6 C) 97.8 F (36.6 C) 98.9 F (37.2 C)  TempSrc:  Oral Oral Axillary  SpO2:  99% 97% 97%  Weight:   59.5 kg   Height:        Intake/Output Summary (Last 24 hours) at 10/22/2024 9072 Last data filed  at 10/22/2024 0005 Gross per 24 hour  Intake --  Output 775 ml  Net -775 ml   Wt Readings from Last 3 Encounters:  10/22/24 59.5 kg  10/05/24 59 kg  08/02/24 54.2 kg    Examination: Constitutional: NAD Eyes: lids and conjunctivae normal, no scleral icterus ENMT: mmm Neck: normal, supple Respiratory: clear to auscultation bilaterally, no wheezing, no crackles. Normal respiratory effort.  Cardiovascular: Regular rate and rhythm, no murmurs / rubs / gallops. No LE edema. Abdomen: soft, no distention, no tenderness. Bowel sounds positive.   Data Reviewed: I have independently reviewed following labs and imaging studies   CBC Recent Labs  Lab 10/17/24 0248 10/22/24 0259  WBC 5.8 5.3  HGB 13.9 14.3  HCT 43.1 44.7  PLT 248 290  MCV 89.0 89.9  MCH 28.7 28.8  MCHC 32.3 32.0  RDW 17.4* 16.9*    Recent Labs  Lab 10/17/24 0248 10/22/24 0259  NA 139 141  K 4.7 3.7  CL 102 101  CO2 26 30  GLUCOSE 93 81  BUN 40* 33*  CREATININE 1.60* 1.41*  CALCIUM  10.8* 11.4*  AST 39  --   ALT 19  --   ALKPHOS 135*  --   BILITOT 0.7  --   ALBUMIN 2.7*  --   MG  --  2.3    ------------------------------------------------------------------------------------------------------------------ No results for input(s): CHOL, HDL, LDLCALC, TRIG, CHOLHDL, LDLDIRECT in the last 72 hours.  Lab Results  Component Value Date   HGBA1C 5.8 10/05/2024   ------------------------------------------------------------------------------------------------------------------ No results for input(s): TSH, T4TOTAL, T3FREE, THYROIDAB in the last 72 hours.  Invalid input(s): FREET3  Cardiac Enzymes No results for input(s): CKMB, TROPONINI, MYOGLOBIN in the last 168 hours.  Invalid input(s): CK ------------------------------------------------------------------------------------------------------------------    Component Value Date/Time   BNP 960.0 (H) 10/13/2024 0404    CBG: No results for input(s): GLUCAP in the last 168 hours.  No results found for this or any previous visit (from the past 240 hours).   Radiology Studies: No results found.  Nilda Fendt, MD, PhD [facility]  Between 7 am - 7 pm I am available, please contact me via Amion (for emergencies) or Securechat (non urgent messages)  Between 7 pm - 7 am I am not available, please contact night coverage MD/APP via Amion  "

## 2024-10-23 DIAGNOSIS — I200 Unstable angina: Secondary | ICD-10-CM | POA: Diagnosis not present

## 2024-10-23 MED ORDER — ORAL CARE MOUTH RINSE: 15.0000 mL | OROMUCOSAL | Status: DC | PRN

## 2024-10-23 NOTE — Plan of Care (Signed)

## 2024-10-23 NOTE — Progress Notes (Addendum)
 Mobility Specialist: Progress Note   10/23/24 1200  Mobility  Activity Ambulated with assistance  Level of Assistance Contact guard assist, steadying assist  Assistive Device Front wheel walker  Distance Ambulated (ft) 60 ft  Activity Response Tolerated well  Mobility Referral Yes  Mobility visit 1 Mobility  Mobility Specialist Start Time (ACUTE ONLY) 0910  Mobility Specialist Stop Time (ACUTE ONLY) 0935  Mobility Specialist Time Calculation (min) (ACUTE ONLY) 25 min    Pt received in bed, agreeable to mobility session. SV for bed mobility with extra time. CGA for STS with slow rise up. CGA for ambulation. Ambulated down by the front desk and returned to room without fault. VSS on RA. No complaints. Declined sitting in the chair at this time. Light minA for sit>supine to assist BLE. Left in bed with all needs met, call bell in reach.   Ileana Lute Mobility Specialist Please contact via SecureChat or Rehab office at [phone]

## 2024-10-23 NOTE — Progress Notes (Signed)
 " PROGRESS NOTE  HELVI ROYALS FMW:[id number] DOB: July 18, 1941 DOA: 10/09/2024 PCP: Johnny Garnette LABOR, MD   LOS: 13 days   Brief Narrative / Interim history: 83 year old female with history of CAD, PAF on Eliquis , HTN, HLD, CKD 3a, sick sinus syndrome status post pacemaker who comes into the hospital with chest pain.  Cardiology was consulted on admission, underwent left heart cardiac cath with patent proximal LAD stent with minimal restenosis, mild nonobstructive disease in the mid circumflex and mild nonobstructive disease in the mid RCA, recommending medical management.  SNF was recommended, however insurance is refusing to approve.  Decision was appealed and insurance declined again despite medical team recommendations.  Subjective / 24h Interval events: Feels well today, no chest pain, no shortness of breath  Assesement and Plan: Principal problem Chest pain, nonobstructive CAD -cardiology consulted and evaluated patient while hospitalized.  She underwent left heart cardiac cath with patent proximal LAD stent with minimal restenosis, mild nonobstructive disease in the mid circumflex and mild nonobstructive disease in the mid RCA, recommending medical management. - Continue statin, Imdur , metoprolol .  No chest pain today  Active problems Acute on chronic diastolic CHF -echocardiogram showed EF of 60 to 65%, apical hypertrophic cardiomyopathy, grade 2 diastolic dysfunction.  She was initially fluid overloaded requiring diuresis with IV Lasix , currently she is on furosemide  40 mg daily p.o., tolerating it well.  Appears euvolemic - Labs stable yesterday  CKD 3B -baseline creatinine over 2025 appears to range between 1.2 and 1.6.  At baseline on most recent labs  PAF -continue amiodarone , metoprolol , she is anticoagulated with Eliquis   SSS status post pacemaker-remains paced on telemetry  Hypophosphatemia-continue to monitor and replenish as indicated  AGMA -resolved  Disposition  -to be determined, overall difficult as she lives by herself and insurance has denied her to go to SNF.  She is quite unsteady and based on most recent evaluation requires 2 assist, unsteady even with a walker and bilateral knees buckling.  Case manager working with the family to see what can be arranged in an outpatient setting, but ideally she should have 24/7 care  Scheduled Meds:  (feeding supplement) PROSource Plus  30 mL Oral BID BM   amiodarone   200 mg Oral Daily   apixaban   2.5 mg Oral BID   atorvastatin   40 mg Oral Daily   empagliflozin   10 mg Oral Daily   feeding supplement  237 mL Oral BID BM   furosemide   40 mg Oral Daily   guaiFENesin   600 mg Oral BID   isosorbide  mononitrate  30 mg Oral Daily   metoprolol  tartrate  25 mg Oral BID   multivitamin with minerals  1 tablet Oral Daily   pantoprazole   40 mg Oral Daily   senna-docusate  1 tablet Oral QHS   sodium chloride  flush  3 mL Intravenous Q12H   sodium chloride  flush  3 mL Intravenous Q12H   Continuous Infusions: PRN Meds:.acetaminophen  **OR** acetaminophen , albuterol , alum & mag hydroxide-simeth, loratadine , menthol , ondansetron  **OR** ondansetron  (ZOFRAN ) IV, mouth rinse, polyethylene glycol, sodium chloride  flush, sodium chloride  flush  Current Outpatient Medications  Medication Instructions   acetaminophen  (TYLENOL ) 650 mg, Oral, Every 6 hours PRN   alum & mag hydroxide-simeth (MAALOX/MYLANTA) 200-200-20 MG/5ML suspension 30 mLs, Oral, Every 4 hours PRN   amiodarone  (PACERONE ) 200 mg, Oral, Daily   apixaban  (ELIQUIS ) 2.5 mg, Oral, 2 times daily   cyanocobalamin  (VITAMIN B12) 500 mcg, Daily   empagliflozin  (JARDIANCE ) 10 mg, Oral, Daily   ipratropium (  ATROVENT ) 0.06 % nasal spray 2 sprays, 2 times daily PRN   isosorbide  mononitrate (IMDUR ) 30 mg, Oral, Daily   ketoconazole  (NIZORAL ) 2 % cream 1 Application, Topical, 2 times daily PRN   loratadine  (CLARITIN ) 10 mg, Daily PRN   losartan  (COZAAR ) 50 mg, Oral, Daily    megestrol  (MEGACE ) 40 mg, Oral, Every morning   metoprolol  tartrate (LOPRESSOR ) 25 mg, Oral, 2 times daily   mirtazapine  (REMERON ) 15 mg, Oral, Daily at bedtime   Multiple Vitamin (MULTIVITAMIN WITH MINERALS) TABS tablet 1 tablet, Oral, Daily   nitroGLYCERIN  (NITROSTAT ) 0.4 mg, Sublingual, Every 5 min PRN   Nutritional Supplements (BOOST/FIBER PO) 1 Bottle, 3 times daily   pantoprazole  (PROTONIX ) 40 mg, Oral, Daily   senna-docusate (SENOKOT-S) 8.6-50 MG tablet 1 tablet, Oral, 2 times daily   sucralfate  (CARAFATE ) 1 g, Oral, 2 times daily   triamcinolone  (NASACORT) 55 MCG/ACT nasal inhaler 1 spray, Daily PRN    Diet Orders (From admission, onward)     Start     Ordered   10/11/24 1514  Diet Heart Room service appropriate? Yes; Fluid consistency: Thin  Diet effective now       Question Answer Comment  Room service appropriate? Yes   Fluid consistency: Thin      10/11/24 1513            DVT prophylaxis: apixaban  (ELIQUIS ) tablet 2.5 mg Start: 10/12/24 0800 apixaban  (ELIQUIS ) tablet 2.5 mg   Lab Results  Component Value Date   PLT 290 10/22/2024      Code Status: Full Code  Family Communication: Discussed with son over the phone 12/17  Status is: Inpatient Remains inpatient appropriate because: Disposition issues  Level of care: Telemetry  Consultants:  Cardiology, now signed off  Objective: Vitals:   10/23/24 0301 10/23/24 0446 10/23/24 0720 10/23/24 0952  BP: 126/85  106/80 111/67  Pulse: (!) 103  89 98  Resp: 16  15   Temp: 98.8 F (37.1 C)  98.2 F (36.8 C)   TempSrc: Oral  Oral   SpO2: 98%  98%   Weight:  59.6 kg    Height:        Intake/Output Summary (Last 24 hours) at 10/23/2024 1016 Last data filed at 10/23/2024 0902 Gross per 24 hour  Intake 360 ml  Output 1650 ml  Net -1290 ml   Wt Readings from Last 3 Encounters:  10/23/24 59.6 kg  10/05/24 59 kg  08/02/24 54.2 kg    Examination: She is in no distress, heart is regular, no murmurs,  lungs are clear to auscultation without wheezing crackles.  She has no peripheral edema  Data Reviewed: I have independently reviewed following labs and imaging studies   CBC Recent Labs  Lab 10/17/24 0248 10/22/24 0259  WBC 5.8 5.3  HGB 13.9 14.3  HCT 43.1 44.7  PLT 248 290  MCV 89.0 89.9  MCH 28.7 28.8  MCHC 32.3 32.0  RDW 17.4* 16.9*    Recent Labs  Lab 10/17/24 0248 10/22/24 0259  NA 139 141  K 4.7 3.7  CL 102 101  CO2 26 30  GLUCOSE 93 81  BUN 40* 33*  CREATININE 1.60* 1.41*  CALCIUM  10.8* 11.4*  AST 39  --   ALT 19  --   ALKPHOS 135*  --   BILITOT 0.7  --   ALBUMIN 2.7*  --   MG  --  2.3    ------------------------------------------------------------------------------------------------------------------ No results for input(s): CHOL, HDL, LDLCALC, TRIG, CHOLHDL, LDLDIRECT  in the last 72 hours.  Lab Results  Component Value Date   HGBA1C 5.8 10/05/2024   ------------------------------------------------------------------------------------------------------------------ No results for input(s): TSH, T4TOTAL, T3FREE, THYROIDAB in the last 72 hours.  Invalid input(s): FREET3  Cardiac Enzymes No results for input(s): CKMB, TROPONINI, MYOGLOBIN in the last 168 hours.  Invalid input(s): CK ------------------------------------------------------------------------------------------------------------------    Component Value Date/Time   BNP 960.0 (H) 10/13/2024 0404   CBG: No results for input(s): GLUCAP in the last 168 hours.  No results found for this or any previous visit (from the past 240 hours).   Radiology Studies: No results found.  Nilda Fendt, MD, PhD [facility]  Between 7 am - 7 pm I am available, please contact me via Amion (for emergencies) or Securechat (non urgent messages)  Between 7 pm - 7 am I am not available, please contact night coverage MD/APP via Amion  "

## 2024-10-24 DIAGNOSIS — I200 Unstable angina: Secondary | ICD-10-CM | POA: Diagnosis not present

## 2024-10-24 NOTE — Progress Notes (Signed)
 " PROGRESS NOTE  Barbara [name] FMW:[id number] DOB: 1941-08-12 DOA: 10/09/2024 PCP: Johnny Garnette LABOR, MD   LOS: 14 days   Brief Narrative / Interim history: 83 year old female with history of CAD, PAF on Eliquis , HTN, HLD, CKD 3a, sick sinus syndrome status post pacemaker who comes into the hospital with chest pain.  Cardiology was consulted on admission, underwent left heart cardiac cath with patent proximal LAD stent with minimal restenosis, mild nonobstructive disease in the mid circumflex and mild nonobstructive disease in the mid RCA, recommending medical management.  SNF was recommended, however insurance is refusing to approve.  Decision was appealed and insurance declined again despite medical team recommendations.  Subjective / 24h Interval events: No complaints, no chest pain, no shortness of breath.  Assesement and Plan: Principal problem Chest pain, nonobstructive CAD -cardiology consulted and evaluated patient while hospitalized.  She underwent left heart cardiac cath with patent proximal LAD stent with minimal restenosis, mild nonobstructive disease in the mid circumflex and mild nonobstructive disease in the mid RCA, recommending medical management. - Continue statin, Imdur , metoprolol .  No chest pain  Active problems Acute on chronic diastolic CHF -echocardiogram showed EF of 60 to 65%, apical hypertrophic cardiomyopathy, grade 2 diastolic dysfunction.  She was initially fluid overloaded requiring diuresis with IV Lasix , currently she is on furosemide  40 mg daily p.o., tolerating it well.  He is euvolemic today - Most recent labs stable  CKD 3B -baseline creatinine over 2025 appears to range between 1.2 and 1.6.  At baseline on most recent labs  PAF -continue amiodarone , metoprolol , she is anticoagulated with Eliquis .  Telemetry reviewed and remains with paced rhythm  SSS status post pacemaker-remains paced on telemetry  Hypophosphatemia-continue to monitor and  replenish as indicated  AGMA -resolved  Disposition -to be determined, overall difficult as she lives by herself and insurance has denied her to go to SNF.  She is quite unsteady and based on most recent evaluation requires 2 assist, unsteady even with a walker and bilateral knees buckling.  Case manager working with the family to see what can be arranged in an outpatient setting, but ideally she should have 24/7 care  Scheduled Meds:  (feeding supplement) PROSource Plus  30 mL Oral BID BM   amiodarone   200 mg Oral Daily   apixaban   2.5 mg Oral BID   atorvastatin   40 mg Oral Daily   empagliflozin   10 mg Oral Daily   feeding supplement  237 mL Oral BID BM   furosemide   40 mg Oral Daily   guaiFENesin   600 mg Oral BID   isosorbide  mononitrate  30 mg Oral Daily   metoprolol  tartrate  25 mg Oral BID   multivitamin with minerals  1 tablet Oral Daily   pantoprazole   40 mg Oral Daily   senna-docusate  1 tablet Oral QHS   Continuous Infusions: PRN Meds:.acetaminophen  **OR** acetaminophen , albuterol , alum & mag hydroxide-simeth, loratadine , menthol , ondansetron  **OR** ondansetron  (ZOFRAN ) IV, mouth rinse, polyethylene glycol  Current Outpatient Medications  Medication Instructions   acetaminophen  (TYLENOL ) 650 mg, Oral, Every 6 hours PRN   alum & mag hydroxide-simeth (MAALOX/MYLANTA) 200-200-20 MG/5ML suspension 30 mLs, Oral, Every 4 hours PRN   amiodarone  (PACERONE ) 200 mg, Oral, Daily   apixaban  (ELIQUIS ) 2.5 mg, Oral, 2 times daily   cyanocobalamin  (VITAMIN B12) 500 mcg, Daily   empagliflozin  (JARDIANCE ) 10 mg, Oral, Daily   ipratropium (ATROVENT ) 0.06 % nasal spray 2 sprays, 2 times daily PRN   isosorbide  mononitrate (IMDUR ) 30  mg, Oral, Daily   ketoconazole  (NIZORAL ) 2 % cream 1 Application, Topical, 2 times daily PRN   loratadine  (CLARITIN ) 10 mg, Daily PRN   losartan  (COZAAR ) 50 mg, Oral, Daily   megestrol  (MEGACE ) 40 mg, Oral, Every morning   metoprolol  tartrate (LOPRESSOR ) 25 mg,  Oral, 2 times daily   mirtazapine  (REMERON ) 15 mg, Oral, Daily at bedtime   Multiple Vitamin (MULTIVITAMIN WITH MINERALS) TABS tablet 1 tablet, Oral, Daily   nitroGLYCERIN  (NITROSTAT ) 0.4 mg, Sublingual, Every 5 min PRN   Nutritional Supplements (BOOST/FIBER PO) 1 Bottle, 3 times daily   pantoprazole  (PROTONIX ) 40 mg, Oral, Daily   senna-docusate (SENOKOT-S) 8.6-50 MG tablet 1 tablet, Oral, 2 times daily   sucralfate  (CARAFATE ) 1 g, Oral, 2 times daily   triamcinolone  (NASACORT) 55 MCG/ACT nasal inhaler 1 spray, Daily PRN    Diet Orders (From admission, onward)     Start     Ordered   10/11/24 1514  Diet Heart Room service appropriate? Yes; Fluid consistency: Thin  Diet effective now       Question Answer Comment  Room service appropriate? Yes   Fluid consistency: Thin      10/11/24 1513            DVT prophylaxis: apixaban  (ELIQUIS ) tablet 2.5 mg Start: 10/12/24 0800 apixaban  (ELIQUIS ) tablet 2.5 mg   Lab Results  Component Value Date   PLT 290 10/22/2024      Code Status: Full Code  Family Communication: Discussed with son over the phone 12/20  Status is: Inpatient Remains inpatient appropriate because: Disposition issues  Level of care: Telemetry  Consultants:  Cardiology, now signed off  Objective: Vitals:   10/23/24 2348 10/24/24 0416 10/24/24 0500 10/24/24 0736  BP: 91/66 109/69  130/72  Pulse: 93 75  79  Resp: 20 16  11   Temp: 98.1 F (36.7 C) 98 F (36.7 C)  97.9 F (36.6 C)  TempSrc: Oral Axillary  Oral  SpO2: 96% 97%  98%  Weight:   58.8 kg   Height:        Intake/Output Summary (Last 24 hours) at 10/24/2024 0958 Last data filed at 10/24/2024 0944 Gross per 24 hour  Intake 360 ml  Output 850 ml  Net -490 ml   Wt Readings from Last 3 Encounters:  10/24/24 58.8 kg  10/05/24 59 kg  08/02/24 54.2 kg    Examination: Constitutional: NAD Eyes: lids and conjunctivae normal, no scleral icterus ENMT: mmm Neck: normal,  supple Respiratory: clear to auscultation bilaterally, no wheezing, no crackles. Normal respiratory effort.  Cardiovascular: Regular rate and rhythm, no murmurs / rubs / gallops. No LE edema. Abdomen: soft, no distention, no tenderness. Bowel sounds positive.   Data Reviewed: I have independently reviewed following labs and imaging studies   CBC Recent Labs  Lab 10/22/24 0259  WBC 5.3  HGB 14.3  HCT 44.7  PLT 290  MCV 89.9  MCH 28.8  MCHC 32.0  RDW 16.9*    Recent Labs  Lab 10/22/24 0259  NA 141  K 3.7  CL 101  CO2 30  GLUCOSE 81  BUN 33*  CREATININE 1.41*  CALCIUM  11.4*  MG 2.3    ------------------------------------------------------------------------------------------------------------------ No results for input(s): CHOL, HDL, LDLCALC, TRIG, CHOLHDL, LDLDIRECT in the last 72 hours.  Lab Results  Component Value Date   HGBA1C 5.8 10/05/2024   ------------------------------------------------------------------------------------------------------------------ No results for input(s): TSH, T4TOTAL, T3FREE, THYROIDAB in the last 72 hours.  Invalid input(s): FREET3  Cardiac Enzymes  No results for input(s): CKMB, TROPONINI, MYOGLOBIN in the last 168 hours.  Invalid input(s): CK ------------------------------------------------------------------------------------------------------------------    Component Value Date/Time   BNP 960.0 (H) 10/13/2024 0404   CBG: No results for input(s): GLUCAP in the last 168 hours.  No results found for this or any previous visit (from the past 240 hours).   Radiology Studies: No results found.  Nilda Fendt, MD, PhD [facility]  Between 7 am - 7 pm I am available, please contact me via Amion (for emergencies) or Securechat (non urgent messages)  Between 7 pm - 7 am I am not available, please contact night coverage MD/APP via Amion  "

## 2024-10-24 NOTE — Plan of Care (Signed)
   Problem: Health Behavior/Discharge Planning: Goal: Ability to manage health-related needs will improve Outcome: Progressing   Problem: Clinical Measurements: Goal: Ability to maintain clinical measurements within normal limits will improve Outcome: Progressing   Problem: Clinical Measurements: Goal: Will remain free from infection Outcome: Progressing

## 2024-10-24 NOTE — Plan of Care (Signed)
  Problem: Education: Goal: Knowledge of General Education information will improve Description: Including pain rating scale, medication(s)/side effects and non-pharmacologic comfort measures Outcome: Progressing   Problem: Health Behavior/Discharge Planning: Goal: Ability to manage health-related needs will improve Outcome: Progressing   Problem: Clinical Measurements: Goal: Ability to maintain clinical measurements within normal limits will improve Outcome: Progressing Goal: Will remain free from infection Outcome: Progressing Goal: Diagnostic test results will improve Outcome: Progressing Goal: Respiratory complications will improve Outcome: Progressing Goal: Cardiovascular complication will be avoided Outcome: Progressing   Problem: Activity: Goal: Risk for activity intolerance will decrease Outcome: Progressing   Problem: Nutrition: Goal: Adequate nutrition will be maintained Outcome: Progressing   Problem: Coping: Goal: Level of anxiety will decrease Outcome: Progressing   Problem: Elimination: Goal: Will not experience complications related to bowel motility Outcome: Progressing Goal: Will not experience complications related to urinary retention Outcome: Progressing   Problem: Pain Managment: Goal: General experience of comfort will improve and/or be controlled Outcome: Progressing   Problem: Safety: Goal: Ability to remain free from injury will improve Outcome: Progressing   Problem: Skin Integrity: Goal: Risk for impaired skin integrity will decrease Outcome: Progressing   Problem: Education: Goal: Understanding of CV disease, CV risk reduction, and recovery process will improve Outcome: Progressing Goal: Individualized Educational Video(s) Outcome: Progressing   Problem: Activity: Goal: Ability to return to baseline activity level will improve Outcome: Progressing   Problem: Cardiovascular: Goal: Ability to achieve and maintain adequate  cardiovascular perfusion will improve Outcome: Progressing Goal: Vascular access site(s) Level 0-1 will be maintained Outcome: Progressing

## 2024-10-24 NOTE — Progress Notes (Signed)
 Mobility Specialist: Progress Note   10/24/24 1400  Mobility  Activity Ambulated with assistance  Level of Assistance Contact guard assist, steadying assist  Assistive Device Front wheel walker  Distance Ambulated (ft) 60 ft  Activity Response Tolerated well  Mobility Referral Yes  Mobility visit 1 Mobility  Mobility Specialist Start Time (ACUTE ONLY) 0907  Mobility Specialist Stop Time (ACUTE ONLY) 0933  Mobility Specialist Time Calculation (min) (ACUTE ONLY) 26 min    Pt received in bed, agreeable to mobility session. SV for bed mobility. CGA for STS with very slow rise. CGA for ambulation. No complaints. VSS on RA. Returned to room. Left in bed with all needs met, call bell in reach.   Ileana Lute Mobility Specialist Please contact via SecureChat or Rehab office at [phone]

## 2024-10-25 DIAGNOSIS — I200 Unstable angina: Secondary | ICD-10-CM | POA: Diagnosis not present

## 2024-10-25 NOTE — Progress Notes (Signed)
 Nutrition Follow-up  DOCUMENTATION CODES:   Not applicable  INTERVENTION:  -Continue Ensure Plus High Protein po BID, each supplement provides 350 kcal and 20 grams of protein  -MVI with minerals daily -Continue Prosource Plus BID, each supplement provides 100 kcals and 15 grams protein -Recommend ionized calcium  draw to assess calcium  trend   NUTRITION DIAGNOSIS:  Increased nutrient needs related to chronic illness (CHF, wound healing) as evidenced by estimated needs.  GOAL:  Patient will meet greater than or equal to 90% of their needs  MONITOR:  PO intake, Supplement acceptance  REASON FOR ASSESSMENT:   Consult Assessment of nutrition requirement/status  ASSESSMENT:   83 y.o. female with medical history significant of  CAD, a.fib on eliquis , HTN, HLD, GERD, CKD3a, SSS sp pacemaker admitted for NSTEMI , hypokalemia, and FTT.  Patient overall medically stable. Discharge planning ongoing and has shifted from private pay SNF to discharge home with [facility] and family member to assist with ADLs.  Average Meal Intake 12/19: 75% x1 documented meal 12/20: 80-100% x2 documented meals 12/21: 60% x1 documented meal   Intake improving. No difficulties chewing or swallowing noted. Bowels stable. Goes every 1-2 days at baseline.  Accepting one prosource per day, on average and consuming Ensure supplements BID.   Admit Weight: 59 kg Current Weight: 57.7 kg   Weight stable. Edema has improved. Continues with non-pitting edema to BLEs. Patient remains at high risk for malnutrition given advanced age, skin breakdown, poor oral intake, and multiple co-morbidities. Recommend continue ONS.   Medications: Jardiance , furosemide , MVI, pantoprazole , senna-docusate   Corrected calcium  elevated. Concerning in presence of CKDIII. Would benefit from ionized calcium  draw to corroborate trend. Discussed with MD. She remains asymptomatic. No new labs today to add to this morning's panel. Recommend adding  to panel on next lab draw. High calcium  likely 2/2 immobility, but concerning as this can contribute to heart arrhythmias. BUN/Crt now trending down.   Labs from 12/19 reviewed:  Na+ 141 (wdl) K+ 3.7 (wdl) Corr Ca cannot calculate d/t no albumin draw BUN 33 (H) Crt 1.41 CBGs 81-93 x24 hours A1c 5.8 (10/2024)   Diet Order:   Diet Order             Diet regular Fluid consistency: Thin  Diet effective now                   EDUCATION NEEDS:  Education needs have been addressed  Skin:  Skin Assessment: Skin Integrity Issues: Skin Integrity Issues:: DTI DTI: sacrum  Last BM:  12/20 - type 6 x1  Height:  Ht Readings from Last 1 Encounters:  10/09/24 5' 8 (1.727 m)   Weight:  Wt Readings from Last 1 Encounters:  10/25/24 57.7 kg    Ideal Body Weight:  63.6 kg  BMI:  Body mass index is 19.34 kg/m.  Estimated Nutritional Needs:   Kcal:  1650-1850  Protein:  80-95 grams  Fluid:  1.6-1.8 L  Blair Deaner MS, RD, LDN Registered Dietitian Clinical Nutrition RD Inpatient Contact Info in Amion

## 2024-10-25 NOTE — Plan of Care (Signed)
   Problem: Health Behavior/Discharge Planning: Goal: Ability to manage health-related needs will improve Outcome: Progressing   Problem: Activity: Goal: Risk for activity intolerance will decrease Outcome: Progressing   Problem: Pain Managment: Goal: General experience of comfort will improve and/or be controlled Outcome: Progressing

## 2024-10-25 NOTE — Plan of Care (Signed)
  Problem: Education: Goal: Knowledge of General Education information will improve Description: Including pain rating scale, medication(s)/side effects and non-pharmacologic comfort measures Outcome: Progressing   Problem: Health Behavior/Discharge Planning: Goal: Ability to manage health-related needs will improve Outcome: Progressing   Problem: Clinical Measurements: Goal: Ability to maintain clinical measurements within normal limits will improve Outcome: Progressing Goal: Will remain free from infection Outcome: Progressing Goal: Diagnostic test results will improve Outcome: Progressing Goal: Respiratory complications will improve Outcome: Progressing Goal: Cardiovascular complication will be avoided Outcome: Progressing   Problem: Activity: Goal: Risk for activity intolerance will decrease Outcome: Progressing   Problem: Nutrition: Goal: Adequate nutrition will be maintained Outcome: Progressing   Problem: Coping: Goal: Level of anxiety will decrease Outcome: Progressing   Problem: Elimination: Goal: Will not experience complications related to bowel motility Outcome: Progressing Goal: Will not experience complications related to urinary retention Outcome: Progressing   Problem: Pain Managment: Goal: General experience of comfort will improve and/or be controlled Outcome: Progressing   Problem: Safety: Goal: Ability to remain free from injury will improve Outcome: Progressing   Problem: Skin Integrity: Goal: Risk for impaired skin integrity will decrease Outcome: Progressing   Problem: Education: Goal: Understanding of CV disease, CV risk reduction, and recovery process will improve Outcome: Progressing Goal: Individualized Educational Video(s) Outcome: Progressing   Problem: Activity: Goal: Ability to return to baseline activity level will improve Outcome: Progressing   Problem: Cardiovascular: Goal: Ability to achieve and maintain adequate  cardiovascular perfusion will improve Outcome: Progressing Goal: Vascular access site(s) Level 0-1 will be maintained Outcome: Progressing

## 2024-10-25 NOTE — Progress Notes (Signed)
 " PROGRESS NOTE  Barbara [name] FMW:[id number] DOB: 1941-01-31 DOA: 10/09/2024 PCP: Johnny Garnette LABOR, MD   LOS: 15 days   Brief Narrative / Interim history: 83 year old female with history of CAD, PAF on Eliquis , HTN, HLD, CKD 3a, sick sinus syndrome status post pacemaker who comes into the hospital with chest pain.  Cardiology was consulted on admission, underwent left heart cardiac cath with patent proximal LAD stent with minimal restenosis, mild nonobstructive disease in the mid circumflex and mild nonobstructive disease in the mid RCA, recommending medical management.  SNF was recommended, however insurance is refusing to approve.  Decision was appealed and insurance declined again despite medical team recommendations.  Subjective / 24h Interval events: No complaints  Assesement and Plan: Principal problem Chest pain, nonobstructive CAD -cardiology consulted and evaluated patient while hospitalized.  She underwent left heart cardiac cath with patent proximal LAD stent with minimal restenosis, mild nonobstructive disease in the mid circumflex and mild nonobstructive disease in the mid RCA, recommending medical management. - Continue statin, Imdur , metoprolol .  No chest pain  Active problems Acute on chronic diastolic CHF -echocardiogram showed EF of 60 to 65%, apical hypertrophic cardiomyopathy, grade 2 diastolic dysfunction.  She was initially fluid overloaded requiring diuresis with IV Lasix , currently she is on furosemide  40 mg daily p.o., tolerating it well.  He is euvolemic today - Most recent labs stable  CKD 3B -baseline creatinine over 2025 appears to range between 1.2 and 1.6.  At baseline on most recent labs  PAF -continue amiodarone , metoprolol , she is anticoagulated with Eliquis .  Telemetry reviewed and remains with paced rhythm  SSS status post pacemaker-remains paced on telemetry  Hypophosphatemia-continue to monitor and replenish as indicated  AGMA  -resolved  Disposition -to be determined, overall difficult as she lives by herself and insurance has denied her to go to SNF.  She is quite unsteady and based on most recent evaluation requires 2 assist, unsteady even with a walker and bilateral knees buckling.  Case manager working with the family to see what can be arranged in an outpatient setting, but ideally she should have 24/7 care  Scheduled Meds:  (feeding supplement) PROSource Plus  30 mL Oral BID BM   amiodarone   200 mg Oral Daily   apixaban   2.5 mg Oral BID   atorvastatin   40 mg Oral Daily   empagliflozin   10 mg Oral Daily   feeding supplement  237 mL Oral BID BM   furosemide   40 mg Oral Daily   guaiFENesin   600 mg Oral BID   isosorbide  mononitrate  30 mg Oral Daily   metoprolol  tartrate  25 mg Oral BID   multivitamin with minerals  1 tablet Oral Daily   pantoprazole   40 mg Oral Daily   senna-docusate  1 tablet Oral QHS   Continuous Infusions: PRN Meds:.acetaminophen  **OR** acetaminophen , albuterol , alum & mag hydroxide-simeth, loratadine , menthol , ondansetron  **OR** ondansetron  (ZOFRAN ) IV, mouth rinse, polyethylene glycol  Current Outpatient Medications  Medication Instructions   acetaminophen  (TYLENOL ) 650 mg, Oral, Every 6 hours PRN   alum & mag hydroxide-simeth (MAALOX/MYLANTA) 200-200-20 MG/5ML suspension 30 mLs, Oral, Every 4 hours PRN   amiodarone  (PACERONE ) 200 mg, Oral, Daily   apixaban  (ELIQUIS ) 2.5 mg, Oral, 2 times daily   cyanocobalamin  (VITAMIN B12) 500 mcg, Daily   empagliflozin  (JARDIANCE ) 10 mg, Oral, Daily   ipratropium (ATROVENT ) 0.06 % nasal spray 2 sprays, 2 times daily PRN   isosorbide  mononitrate (IMDUR ) 30 mg, Oral, Daily   ketoconazole  (NIZORAL )  2 % cream 1 Application, Topical, 2 times daily PRN   loratadine  (CLARITIN ) 10 mg, Daily PRN   losartan  (COZAAR ) 50 mg, Oral, Daily   megestrol  (MEGACE ) 40 mg, Oral, Every morning   metoprolol  tartrate (LOPRESSOR ) 25 mg, Oral, 2 times daily    mirtazapine  (REMERON ) 15 mg, Oral, Daily at bedtime   Multiple Vitamin (MULTIVITAMIN WITH MINERALS) TABS tablet 1 tablet, Oral, Daily   nitroGLYCERIN  (NITROSTAT ) 0.4 mg, Sublingual, Every 5 min PRN   Nutritional Supplements (BOOST/FIBER PO) 1 Bottle, 3 times daily   pantoprazole  (PROTONIX ) 40 mg, Oral, Daily   senna-docusate (SENOKOT-S) 8.6-50 MG tablet 1 tablet, Oral, 2 times daily   sucralfate  (CARAFATE ) 1 g, Oral, 2 times daily   triamcinolone  (NASACORT) 55 MCG/ACT nasal inhaler 1 spray, Daily PRN    Diet Orders (From admission, onward)     Start     Ordered   10/25/24 0823  Diet regular Fluid consistency: Thin  Diet effective now       Question:  Fluid consistency:  Answer:  Thin   10/25/24 9177            DVT prophylaxis: apixaban  (ELIQUIS ) tablet 2.5 mg Start: 10/12/24 0800 apixaban  (ELIQUIS ) tablet 2.5 mg   Lab Results  Component Value Date   PLT 290 10/22/2024      Code Status: Full Code  Family Communication: No family at bedside  Status is: Inpatient Remains inpatient appropriate because: Disposition issues  Level of care: Telemetry  Consultants:  Cardiology, now signed off  Objective: Vitals:   10/25/24 0358 10/25/24 0802 10/25/24 1058 10/25/24 1105  BP: 130/65 123/67 123/66 123/66  Pulse: 71 78 79 83  Resp: 16 13 20    Temp: 98.3 F (36.8 C) 98.3 F (36.8 C) 98.6 F (37 C)   TempSrc: Oral Oral Oral   SpO2: 98% 99% 99%   Weight: 57.7 kg     Height:        Intake/Output Summary (Last 24 hours) at 10/25/2024 1154 Last data filed at 10/25/2024 0350 Gross per 24 hour  Intake --  Output 400 ml  Net -400 ml   Wt Readings from Last 3 Encounters:  10/25/24 57.7 kg  10/05/24 59 kg  08/02/24 54.2 kg    Examination: Constitutional: She is in no distress Data Reviewed: I have independently reviewed following labs and imaging studies   CBC Recent Labs  Lab 10/22/24 0259  WBC 5.3  HGB 14.3  HCT 44.7  PLT 290  MCV 89.9  MCH 28.8  MCHC  32.0  RDW 16.9*    Recent Labs  Lab 10/22/24 0259  NA 141  K 3.7  CL 101  CO2 30  GLUCOSE 81  BUN 33*  CREATININE 1.41*  CALCIUM  11.4*  MG 2.3    ------------------------------------------------------------------------------------------------------------------ No results for input(s): CHOL, HDL, LDLCALC, TRIG, CHOLHDL, LDLDIRECT in the last 72 hours.  Lab Results  Component Value Date   HGBA1C 5.8 10/05/2024   ------------------------------------------------------------------------------------------------------------------ No results for input(s): TSH, T4TOTAL, T3FREE, THYROIDAB in the last 72 hours.  Invalid input(s): FREET3  Cardiac Enzymes No results for input(s): CKMB, TROPONINI, MYOGLOBIN in the last 168 hours.  Invalid input(s): CK ------------------------------------------------------------------------------------------------------------------    Component Value Date/Time   BNP 960.0 (H) 10/13/2024 0404   CBG: No results for input(s): GLUCAP in the last 168 hours.  No results found for this or any previous visit (from the past 240 hours).   Radiology Studies: No results found.  Nilda Fendt, MD, PhD  [facility]  Between 7 am - 7 pm I am available, please contact me via Amion (for emergencies) or Securechat (non urgent messages)  Between 7 pm - 7 am I am not available, please contact night coverage MD/APP via Amion  "

## 2024-10-25 NOTE — TOC Progression Note (Addendum)
 Transition of Care [facility]) - Progression Note    Patient Details  Name: Barbara [name] MRN: [id number] Date of Birth: 01-15-1941  [facility]) CM/SW Contact  Roxie KANDICE Stain, RN Phone Number: 10/25/2024, 2:21 PM  Clinical Narrative:     Spoke to son, Jerod, who states patient is working on caregiver support 24/7. Patient states she is waiting on one person she hopes to be a caregiver to call her back. Number left with patient to call this RNCM when this is figured out.  Cory with [employer] notified of home health needs and PCS-aide (HTA medicare). Darleene will reach out to [facility] department and they will contact son with information.  Patient will need home health PT, OT, RN, Aide, SW orders.  Patient will need PTAR transportation home.  ICM will continue to follow.,  1624 Jerod notified this RNCM that the caregivers family was trying to find aren't available.  Ryan with [employer] private duty care will reach out to son. Son is agreeable to pay for private duty caregivers.   Expected Discharge Plan: Home w Home Health Services Barriers to Discharge: Other (must enter comment) (Need to arrange caregivers at home)               Expected Discharge Plan and Services In-house Referral: Clinical Social Work   Post Acute Care Choice: Skilled Nursing Facility Living arrangements for the past 2 months: Single Family Home                                       Social Drivers of Health (SDOH) Interventions SDOH Screenings   Food Insecurity: No Food Insecurity (10/09/2024)  Housing: High Risk (06/20/2024)  Transportation Needs: Unmet Transportation Needs (06/20/2024)  Utilities: Not At Risk (10/09/2024)  Alcohol Screen: Low Risk (08/05/2022)  Depression (PHQ2-9): Low Risk (04/05/2024)  Financial Resource Strain: Low Risk (08/05/2022)  Physical Activity: Insufficiently Active (07/02/2022)  Social Connections: Moderately Integrated (06/20/2024)  Stress: No Stress  Concern Present (07/02/2022)  Tobacco Use: Medium Risk (10/09/2024)    Readmission Risk Interventions    05/24/2024    8:57 AM 03/11/2024    1:07 PM  Readmission Risk Prevention Plan  Transportation Screening Complete Complete  PCP or Specialist Appt within 5-7 Days  Complete  Home Care Screening  Complete  Medication Review (RN CM)  Complete  HRI or Home Care Consult Complete   Social Work Consult for Recovery Care Planning/Counseling Complete   Palliative Care Screening Not Applicable   Medication Review Oceanographer) Complete

## 2024-10-25 NOTE — Progress Notes (Signed)
 Mobility Specialist: Progress Note   10/25/24 1400  Mobility  Activity Pivoted/transferred from chair to bed  Level of Assistance Contact guard assist, steadying assist  Assistive Device Front wheel walker  Activity Response Tolerated well  Mobility Referral Yes  Mobility visit 1 Mobility  Mobility Specialist Start Time (ACUTE ONLY) 1056  Mobility Specialist Stop Time (ACUTE ONLY) 1102  Mobility Specialist Time Calculation (min) (ACUTE ONLY) 6 min    Pt received in chair, requesting assistance back to bed. Light minA for STS from chair, CGA for stand pivot to chair. MinA for bed mobility to assist BLEs onto bed. Left in bed with all needs met, call bell in reach.   Barbara [name] Mobility Specialist Please contact via SecureChat or Rehab office at [phone]

## 2024-10-25 NOTE — Progress Notes (Signed)
 Occupational Therapy Treatment Patient Details Name: Barbara [name] MRN: [id number] DOB: 1941/03/07 Today's Date: 10/25/2024   History of present illness 83 yo female adm 12/6 with SOB and chest pain. Admitted for NSTEMI and acute CHF exacerbation. PMH includes: CAD, HFpEF, A-fib s/p PPM, essential HTN, HLD, anxiety, CKD, anemia, Rt TSA, vertigo, Lt femur fx   OT comments  Pt progressing toward goals, able to perform bed mobility min A and CGA for ADLs, min A for transfers with RW, mild knee buckling but pt able to self correct, and able to static standing during session x2 without UE support. Pt presenting with impairments listed below, will follow acutely. Patient will benefit from continued inpatient follow up therapy, <3 hours/day to maximize safety/ind with ADL/functional mobility.       If plan is discharge home, recommend the following:  A little help with walking and/or transfers;Assistance with cooking/housework;Direct supervision/assist for medications management;Direct supervision/assist for financial management;Assist for transportation;Help with stairs or ramp for entrance;A little help with bathing/dressing/bathroom   Equipment Recommendations  None recommended by OT    Recommendations for Other Services PT consult    Precautions / Restrictions Precautions Precautions: Fall Recall of Precautions/Restrictions: Intact Restrictions Weight Bearing Restrictions Per Provider Order: No       Mobility Bed Mobility Overal bed mobility: Needs Assistance Bed Mobility: Supine to Sit     Supine to sit: Min assist     General bed mobility comments: min A HHA to  bring hips to EOB    Transfers Overall transfer level: Needs assistance Equipment used: Rolling walker (2 wheels) Transfers: Sit to/from Stand Sit to Stand: Min assist                 Balance Overall balance assessment: Needs assistance Sitting-balance support: No upper extremity supported, Feet  supported Sitting balance-Leahy Scale: Good     Standing balance support: Bilateral upper extremity supported, During functional activity Standing balance-Leahy Scale: Poor                             ADL either performed or assessed with clinical judgement   ADL Overall ADL's : Needs assistance/impaired                 Upper Body Dressing : Contact guard assist;Sitting       Toilet Transfer: Contact guard assist;Ambulation;Rolling walker (2 wheels)           Functional mobility during ADLs: Contact guard assist;Rolling walker (2 wheels)      Extremity/Trunk Assessment Upper Extremity Assessment Upper Extremity Assessment: Generalized weakness   Lower Extremity Assessment Lower Extremity Assessment: Defer to PT evaluation        Vision   Vision Assessment?: No apparent visual deficits   Perception Perception Perception: Not tested   Praxis Praxis Praxis: Not tested   Communication Communication Communication: No apparent difficulties   Cognition Arousal: Alert Behavior During Therapy: WFL for tasks assessed/performed Cognition: No family/caregiver present to determine baseline                               Following commands: Intact        Cueing   Cueing Techniques: Verbal cues, Gestural cues  Exercises      Shoulder Instructions       General Comments VSS on RA    Pertinent Vitals/ Pain  Pain Assessment Pain Assessment: No/denies pain  Home Living                                          Prior Functioning/Environment              Frequency  Min 2X/week        Progress Toward Goals  OT Goals(current goals can now be found in the care plan section)  Progress towards OT goals: Progressing toward goals     Plan      Co-evaluation                 AM-PAC OT 6 Clicks Daily Activity     Outcome Measure   Help from another person eating meals?: None Help from  another person taking care of personal grooming?: A Little Help from another person toileting, which includes using toliet, bedpan, or urinal?: A Little Help from another person bathing (including washing, rinsing, drying)?: A Lot Help from another person to put on and taking off regular upper body clothing?: A Little Help from another person to put on and taking off regular lower body clothing?: A Lot 6 Click Score: 17    End of Session Equipment Utilized During Treatment: Gait belt;Rolling walker (2 wheels)  OT Visit Diagnosis: Unsteadiness on feet (R26.81);Other abnormalities of gait and mobility (R26.89);Muscle weakness (generalized) (M62.81);History of falling (Z91.81)   Activity Tolerance Patient tolerated treatment well   Patient Left in chair;with call bell/phone within reach   Nurse Communication Mobility status        Time: 9072-9050 OT Time Calculation (min): 22 min  Charges: OT General Charges $OT Visit: 1 Visit OT Treatments $Therapeutic Activity: 8-22 mins  Caylyn Tedeschi K, OTD, OTR/L SecureChat Preferred Acute Rehab [phone]   Laneta MARLA Pereyra 10/25/2024, 12:31 PM

## 2024-10-25 NOTE — TOC Progression Note (Signed)
 Transition of Care [facility]) - Progression Note    Patient Details  Name: Barbara [name] MRN: [id number] Date of Birth: 13-Aug-1941  [facility] CM/SW Contact  Lauraine FORBES Saa, LCSWA Phone Number: 10/25/2024, 4:20 PM  Clinical Narrative:     4:20 PM CSW informed MD and RNCM of [employer] appeal denial.  Expected Discharge Plan: Home w Home Health Services Barriers to Discharge: Other (must enter comment) (Need to arrange caregivers at home)               Expected Discharge Plan and Services In-house Referral: Clinical Social Work   Post Acute Care Choice: Skilled Nursing Facility Living arrangements for the past 2 months: Single Family Home                                       Social Drivers of Health (SDOH) Interventions SDOH Screenings   Food Insecurity: No Food Insecurity (10/09/2024)  Housing: High Risk (06/20/2024)  Transportation Needs: Unmet Transportation Needs (06/20/2024)  Utilities: Not At Risk (10/09/2024)  Alcohol Screen: Low Risk (08/05/2022)  Depression (PHQ2-9): Low Risk (04/05/2024)  Financial Resource Strain: Low Risk (08/05/2022)  Physical Activity: Insufficiently Active (07/02/2022)  Social Connections: Moderately Integrated (06/20/2024)  Stress: No Stress Concern Present (07/02/2022)  Tobacco Use: Medium Risk (10/09/2024)    Readmission Risk Interventions    05/24/2024    8:57 AM 03/11/2024    1:07 PM  Readmission Risk Prevention Plan  Transportation Screening Complete Complete  PCP or Specialist Appt within 5-7 Days  Complete  Home Care Screening  Complete  Medication Review (RN CM)  Complete  HRI or Home Care Consult Complete   Social Work Consult for Recovery Care Planning/Counseling Complete   Palliative Care Screening Not Applicable   Medication Review Oceanographer) Complete

## 2024-10-25 NOTE — Telephone Encounter (Signed)
 Pain is still at the [facility]

## 2024-10-26 DIAGNOSIS — I200 Unstable angina: Secondary | ICD-10-CM | POA: Diagnosis not present

## 2024-10-26 NOTE — Progress Notes (Signed)
 " PROGRESS NOTE  Barbara [name] FMW:[id number] DOB: 13-Sep-1941 DOA: 10/09/2024 PCP: Johnny Garnette LABOR, MD   LOS: 16 days   Brief Narrative / Interim history: 83 year old female with history of CAD, PAF on Eliquis , HTN, HLD, CKD 3a, sick sinus syndrome status post pacemaker who comes into the hospital with chest pain.  Cardiology was consulted on admission, underwent left heart cardiac cath with patent proximal LAD stent with minimal restenosis, mild nonobstructive disease in the mid circumflex and mild nonobstructive disease in the mid RCA, recommending medical management.  SNF was recommended, however insurance is refusing to approve.  Decision was appealed and insurance declined again despite medical team recommendations.  Subjective / 24h Interval events: No complaints   Assesement and Plan: Principal problem Chest pain, nonobstructive CAD -cardiology consulted and evaluated patient while hospitalized.  She underwent left heart cardiac cath with patent proximal LAD stent with minimal restenosis, mild nonobstructive disease in the mid circumflex and mild nonobstructive disease in the mid RCA, recommending medical management.  - Continue statin, Imdur , metoprolol .  No chest pain   Active problems Acute on chronic diastolic CHF -echocardiogram showed EF of 60 to 65%, apical hypertrophic cardiomyopathy, grade 2 diastolic dysfunction.  She was initially fluid overloaded requiring diuresis with IV Lasix , currently she is on furosemide  40 mg daily p.o., tolerating it well.  She is euvolemic - Most recent labs stable  CKD 3B -baseline creatinine over 2025 appears to range between 1.2 and 1.6. At baseline   PAF -continue amiodarone , metoprolol , she is anticoagulated with Eliquis .  Telemetry reviewed and remains with paced rhythm  SSS status post pacemaker-remains paced on telemetry  Hypophosphatemia-continue to monitor and replenish as indicated  AGMA -resolved  Disposition -to be  determined, overall difficult as she lives by herself and insurance has denied her to go to SNF despite inpatient medical recommendations.  She is quite unsteady and based on most recent evaluation requires 2 assist, unsteady even with a walker and bilateral knees buckling.  Case manager working with the family to see what can be arranged in an outpatient setting, but ideally she should have 24/7 care.  Discussed with the son again today, he has pursued few avenues to see if anybody can come sit with her, but has not been able to arrange anything so far.  He is now considering pain 7K out-of-pocket for the patient to go to [facility] as this will not be covered by insurance  Scheduled Meds:  (feeding supplement) PROSource Plus  30 mL Oral BID BM   amiodarone   200 mg Oral Daily   apixaban   2.5 mg Oral BID   atorvastatin   40 mg Oral Daily   empagliflozin   10 mg Oral Daily   feeding supplement  237 mL Oral BID BM   furosemide   40 mg Oral Daily   guaiFENesin   600 mg Oral BID   isosorbide  mononitrate  30 mg Oral Daily   metoprolol  tartrate  25 mg Oral BID   multivitamin with minerals  1 tablet Oral Daily   pantoprazole   40 mg Oral Daily   senna-docusate  1 tablet Oral QHS   Continuous Infusions: PRN Meds:.acetaminophen  **OR** acetaminophen , albuterol , alum & mag hydroxide-simeth, loratadine , menthol , ondansetron  **OR** ondansetron  (ZOFRAN ) IV, mouth rinse, polyethylene glycol  Current Outpatient Medications  Medication Instructions   acetaminophen  (TYLENOL ) 650 mg, Oral, Every 6 hours PRN   alum & mag hydroxide-simeth (MAALOX/MYLANTA) 200-200-20 MG/5ML suspension 30 mLs, Oral, Every 4 hours PRN   amiodarone  (PACERONE ) 200  mg, Oral, Daily   apixaban  (ELIQUIS ) 2.5 mg, Oral, 2 times daily   cyanocobalamin  (VITAMIN B12) 500 mcg, Daily   empagliflozin  (JARDIANCE ) 10 mg, Oral, Daily   ipratropium (ATROVENT ) 0.06 % nasal spray 2 sprays, 2 times daily PRN   isosorbide  mononitrate (IMDUR ) 30 mg, Oral,  Daily   ketoconazole  (NIZORAL ) 2 % cream 1 Application, Topical, 2 times daily PRN   loratadine  (CLARITIN ) 10 mg, Daily PRN   losartan  (COZAAR ) 50 mg, Oral, Daily   megestrol  (MEGACE ) 40 mg, Oral, Every morning   metoprolol  tartrate (LOPRESSOR ) 25 mg, Oral, 2 times daily   mirtazapine  (REMERON ) 15 mg, Oral, Daily at bedtime   Multiple Vitamin (MULTIVITAMIN WITH MINERALS) TABS tablet 1 tablet, Oral, Daily   nitroGLYCERIN  (NITROSTAT ) 0.4 mg, Sublingual, Every 5 min PRN   Nutritional Supplements (BOOST/FIBER PO) 1 Bottle, 3 times daily   pantoprazole  (PROTONIX ) 40 mg, Oral, Daily   senna-docusate (SENOKOT-S) 8.6-50 MG tablet 1 tablet, Oral, 2 times daily   sucralfate  (CARAFATE ) 1 g, Oral, 2 times daily   triamcinolone  (NASACORT) 55 MCG/ACT nasal inhaler 1 spray, Daily PRN    Diet Orders (From admission, onward)     Start     Ordered   10/25/24 0823  Diet regular Fluid consistency: Thin  Diet effective now       Question:  Fluid consistency:  Answer:  Thin   10/25/24 9177            DVT prophylaxis: apixaban  (ELIQUIS ) tablet 2.5 mg Start: 10/12/24 0800 apixaban  (ELIQUIS ) tablet 2.5 mg   Lab Results  Component Value Date   PLT 290 10/22/2024      Code Status: Full Code  Family Communication: No family at bedside  Status is: Inpatient Remains inpatient appropriate because: Disposition issues  Level of care: Telemetry  Consultants:  Cardiology, now signed off  Objective: Vitals:   10/26/24 0352 10/26/24 0738 10/26/24 1005 10/26/24 1100  BP: (!) 150/84 133/65 133/65 132/86  Pulse: 75 75 75 79  Resp: 17 17  20   Temp: 97.8 F (36.6 C) 97.9 F (36.6 C)  (!) 96.5 F (35.8 C)  TempSrc: Oral Oral  Axillary  SpO2: 98% 94%  95%  Weight: 59.1 kg     Height:        Intake/Output Summary (Last 24 hours) at 10/26/2024 1351 Last data filed at 10/26/2024 0354 Gross per 24 hour  Intake 240 ml  Output 950 ml  Net -710 ml   Wt Readings from Last 3 Encounters:  10/26/24  59.1 kg  10/05/24 59 kg  08/02/24 54.2 kg    Examination:  Constitutional: NAD Eyes: lids and conjunctivae normal, no scleral icterus ENMT: mmm Neck: normal, supple Respiratory: clear to auscultation bilaterally, no wheezing, no crackles. Normal respiratory effort.  Cardiovascular: Regular rate and rhythm, no murmurs / rubs / gallops. No LE edema. Abdomen: soft, no distention, no tenderness. Bowel sounds positive.  Skin: no rashes  Data Reviewed: I have independently reviewed following labs and imaging studies   CBC Recent Labs  Lab 10/22/24 0259  WBC 5.3  HGB 14.3  HCT 44.7  PLT 290  MCV 89.9  MCH 28.8  MCHC 32.0  RDW 16.9*    Recent Labs  Lab 10/22/24 0259  NA 141  K 3.7  CL 101  CO2 30  GLUCOSE 81  BUN 33*  CREATININE 1.41*  CALCIUM  11.4*  MG 2.3    ------------------------------------------------------------------------------------------------------------------ No results for input(s): CHOL, HDL, LDLCALC, TRIG, CHOLHDL, LDLDIRECT  in the last 72 hours.  Lab Results  Component Value Date   HGBA1C 5.8 10/05/2024   ------------------------------------------------------------------------------------------------------------------ No results for input(s): TSH, T4TOTAL, T3FREE, THYROIDAB in the last 72 hours.  Invalid input(s): FREET3  Cardiac Enzymes No results for input(s): CKMB, TROPONINI, MYOGLOBIN in the last 168 hours.  Invalid input(s): CK ------------------------------------------------------------------------------------------------------------------    Component Value Date/Time   BNP 960.0 (H) 10/13/2024 0404   CBG: No results for input(s): GLUCAP in the last 168 hours.  No results found for this or any previous visit (from the past 240 hours).   Radiology Studies: No results found.  Nilda Fendt, MD, PhD [facility]  Between 7 am - 7 pm I am available, please contact me via Amion (for  emergencies) or Securechat (non urgent messages)  Between 7 pm - 7 am I am not available, please contact night coverage MD/APP via Amion  "

## 2024-10-26 NOTE — Progress Notes (Signed)
 Physical Therapy Treatment Patient Details Name: Barbara [name] MRN: [id number] DOB: 1941-06-15 Today's Date: 10/26/2024   History of Present Illness 83 yo female adm 12/6 with SOB and chest pain. Admitted for NSTEMI and acute CHF exacerbation. PMH includes: CAD, HFpEF, A-fib s/p PPM, essential HTN, HLD, anxiety, CKD, anemia, Rt TSA, vertigo, Lt femur fx    PT Comments  Pt progressing with mobility but continues to need significant assist, esp for transitions like sit to supine (needed min A for LE's into bed) and sit>stand from chair (needed up to mod A) making it unsafe for pt to be home alone. Worked on these transitions as well as ambulation and balance exercises. Pt tolerated well. Patient will benefit from continued inpatient follow up therapy, <3 hours/day     If plan is discharge home, recommend the following: A little help with walking and/or transfers;A little help with bathing/dressing/bathroom;Assistance with cooking/housework;Assist for transportation;Help with stairs or ramp for entrance   Can travel by private vehicle        Equipment Recommendations  None recommended by PT    Recommendations for Other Services       Precautions / Restrictions Precautions Precautions: Fall Recall of Precautions/Restrictions: Intact Restrictions Weight Bearing Restrictions Per Provider Order: No     Mobility  Bed Mobility Overal bed mobility: Needs Assistance Bed Mobility: Supine to Sit, Sit to Supine     Supine to sit: Contact guard Sit to supine: Min assist   General bed mobility comments: pt able to come to EOB from elevated head and use of rail. Pt has adjustable bed at home. Pt unable to get back in bed without min A to LE's    Transfers Overall transfer level: Needs assistance Equipment used: Rolling walker (2 wheels) Transfers: Sit to/from Stand Sit to Stand: Min assist           General transfer comment: CGA from elevated surface but needed min A from  chair height surface. Pt uses lift chair at home. Practiced multiple times from different heights. Posterior bias noted    Ambulation/Gait Ambulation/Gait assistance: Min assist Gait Distance (Feet): 250 Feet Assistive device: Standup Rollator Gait Pattern/deviations: Step-to pattern, Decreased step length - right, Decreased step length - left, Trunk flexed, Wide base of support Gait velocity: decreased Gait velocity interpretation: <1.8 ft/sec, indicate of risk for recurrent falls Pre-gait activities: marching in place General Gait Details: pt did not have knee buckling with ambulation today though pace and steadiness decreased with increased distance.   Stairs             Wheelchair Mobility     Tilt Bed    Modified Rankin (Stroke Patients Only)       Balance Overall balance assessment: Needs assistance Sitting-balance support: No upper extremity supported, Feet supported Sitting balance-Leahy Scale: Good Sitting balance - Comments: seated EOB   Standing balance support: Bilateral upper extremity supported, During functional activity Standing balance-Leahy Scale: Poor Standing balance comment: Pt needs BUE support on RW or stable surface.                            Communication Communication Communication: No apparent difficulties  Cognition Arousal: Alert Behavior During Therapy: WFL for tasks assessed/performed   PT - Cognitive impairments: No family/caregiver present to determine baseline, Initiation, Sequencing, Problem solving, Safety/Judgement  PT - Cognition Comments: pt needing cues for mobility and transfer technique, Following commands: Intact      Cueing Cueing Techniques: Verbal cues, Gestural cues  Exercises Total Joint Exercises Heel Slides: AROM, Both, 10 reps, Supine Straight Leg Raises: AROM, Both, 5 reps, Supine General Exercises - Lower Extremity Long Arc Quad: AROM, Both, 10 reps,  Seated Other Exercises Other Exercises: bridges x5 Other Exercises: standing reaching with min A    General Comments General comments (skin integrity, edema, etc.): HR 89-95 bpm, SPO2 96% on RA      Pertinent Vitals/Pain Pain Assessment Pain Assessment: Faces Faces Pain Scale: Hurts little more Pain Location: knees Pain Descriptors / Indicators: Aching Pain Intervention(s): Limited activity within patient's tolerance, Monitored during session    Home Living                          Prior Function            PT Goals (current goals can now be found in the care plan section) Acute Rehab PT Goals Patient Stated Goal: to improve strength and go home Progress towards PT goals: Progressing toward goals    Frequency    Min 2X/week      PT Plan      Co-evaluation              AM-PAC PT 6 Clicks Mobility   Outcome Measure  Help needed turning from your back to your side while in a flat bed without using bedrails?: A Little Help needed moving from lying on your back to sitting on the side of a flat bed without using bedrails?: A Little Help needed moving to and from a bed to a chair (including a wheelchair)?: A Little Help needed standing up from a chair using your arms (e.g., wheelchair or bedside chair)?: A Lot Help needed to walk in hospital room?: A Lot Help needed climbing 3-5 steps with a railing? : A Lot 6 Click Score: 15    End of Session Equipment Utilized During Treatment: Gait belt Activity Tolerance: Patient tolerated treatment well Patient left: with call bell/phone within reach;in bed;with bed alarm set Nurse Communication: Mobility status PT Visit Diagnosis: Unsteadiness on feet (R26.81);Other abnormalities of gait and mobility (R26.89);Repeated falls (R29.6);Muscle weakness (generalized) (M62.81)     Time: 8499-8464 PT Time Calculation (min) (ACUTE ONLY): 35 min  Charges:    $Gait Training: 8-22 mins $Therapeutic Exercise:  8-22 mins PT General Charges $$ ACUTE PT VISIT: 1 Visit                     Barbara [name], PT  Acute Rehab Services Secure chat preferred Office [phone]    Barbara [name] 10/26/2024, 4:59 PM

## 2024-10-26 NOTE — Plan of Care (Signed)
  Problem: Education: Goal: Knowledge of General Education information will improve Description: Including pain rating scale, medication(s)/side effects and non-pharmacologic comfort measures Outcome: Progressing   Problem: Health Behavior/Discharge Planning: Goal: Ability to manage health-related needs will improve Outcome: Progressing   Problem: Clinical Measurements: Goal: Ability to maintain clinical measurements within normal limits will improve Outcome: Progressing Goal: Will remain free from infection Outcome: Progressing Goal: Diagnostic test results will improve Outcome: Progressing Goal: Respiratory complications will improve Outcome: Progressing Goal: Cardiovascular complication will be avoided Outcome: Progressing   Problem: Activity: Goal: Risk for activity intolerance will decrease Outcome: Progressing   Problem: Nutrition: Goal: Adequate nutrition will be maintained Outcome: Progressing   Problem: Coping: Goal: Level of anxiety will decrease Outcome: Progressing   Problem: Elimination: Goal: Will not experience complications related to bowel motility Outcome: Progressing Goal: Will not experience complications related to urinary retention Outcome: Progressing   Problem: Pain Managment: Goal: General experience of comfort will improve and/or be controlled Outcome: Progressing   Problem: Safety: Goal: Ability to remain free from injury will improve Outcome: Progressing   Problem: Skin Integrity: Goal: Risk for impaired skin integrity will decrease Outcome: Progressing   Problem: Education: Goal: Understanding of CV disease, CV risk reduction, and recovery process will improve Outcome: Progressing Goal: Individualized Educational Video(s) Outcome: Progressing   Problem: Activity: Goal: Ability to return to baseline activity level will improve Outcome: Progressing   Problem: Cardiovascular: Goal: Ability to achieve and maintain adequate  cardiovascular perfusion will improve Outcome: Progressing Goal: Vascular access site(s) Level 0-1 will be maintained Outcome: Progressing

## 2024-10-26 NOTE — TOC Progression Note (Addendum)
 Transition of Care [facility]) - Progression Note    Patient Details  Name: Barbara [name] MRN: [id number] Date of Birth: [birth year]  [facility]) CM/SW Contact  Justina Delcia Czar, RN Phone Number: [phone] 10/26/2024, 4:30 PM  Clinical Narrative:     Spoke to pt's son, Davina. States they are working on getting a 24 hour caregiver to stay with patient.   Spoke to [place] rep, and states it will take 2 weeks for services to start through [facility].   Working on getting family in town to assist with pt when she goes home.   Has completed a 3rd appeal for SNF rehab with [employer].   States he will complete Medicaid application.   Expected Discharge Plan: Home w Home Health Services Barriers to Discharge: Other (must enter comment) (Need to arrange caregivers at home)     Expected Discharge Plan and Services In-house Referral: Clinical Social Work   Post Acute Care Choice: Skilled Nursing Facility Living arrangements for the past 2 months: Single Family Home                    Social Drivers of Health (SDOH) Interventions SDOH Screenings   Food Insecurity: No Food Insecurity (10/09/2024)  Housing: High Risk (06/20/2024)  Transportation Needs: Unmet Transportation Needs (06/20/2024)  Utilities: Not At Risk (10/09/2024)  Alcohol Screen: Low Risk (08/05/2022)  Depression (PHQ2-9): Low Risk (04/05/2024)  Financial Resource Strain: Low Risk (08/05/2022)  Physical Activity: Insufficiently Active (07/02/2022)  Social Connections: Moderately Integrated (06/20/2024)  Stress: No Stress Concern Present (07/02/2022)  Tobacco Use: Medium Risk (10/09/2024)    Readmission Risk Interventions    05/24/2024    8:57 AM 03/11/2024    1:07 PM  Readmission Risk Prevention Plan  Transportation Screening Complete Complete  PCP or Specialist Appt within 5-7 Days  Complete  Home Care Screening  Complete  Medication Review (RN CM)  Complete  HRI or Home Care Consult  Complete   Social Work Consult for Recovery Care Planning/Counseling Complete   Palliative Care Screening Not Applicable   Medication Review Oceanographer) Complete

## 2024-10-26 NOTE — Plan of Care (Signed)
  Problem: Clinical Measurements: Goal: Will remain free from infection Outcome: Progressing   Problem: Activity: Goal: Risk for activity intolerance will decrease Outcome: Progressing   Problem: Nutrition: Goal: Adequate nutrition will be maintained Outcome: Progressing   Problem: Coping: Goal: Level of anxiety will decrease Outcome: Progressing   

## 2024-10-27 ENCOUNTER — Other Ambulatory Visit (HOSPITAL_COMMUNITY): Payer: Self-pay

## 2024-10-27 DIAGNOSIS — I200 Unstable angina: Secondary | ICD-10-CM | POA: Diagnosis not present

## 2024-10-27 LAB — CBC
HCT: 42.1 % (ref 36.0–46.0)
Hemoglobin: 13.7 g/dL (ref 12.0–15.0)
MCH: 28.7 pg (ref 26.0–34.0)
MCHC: 32.5 g/dL (ref 30.0–36.0)
MCV: 88.3 fL (ref 80.0–100.0)
Platelets: 212 K/uL (ref 150–400)
RBC: 4.77 MIL/uL (ref 3.87–5.11)
RDW: 16.9 % — ABNORMAL HIGH (ref 11.5–15.5)
WBC: 5.4 K/uL (ref 4.0–10.5)
nRBC: 0 % (ref 0.0–0.2)

## 2024-10-27 LAB — BASIC METABOLIC PANEL WITH GFR
Anion gap: 9 (ref 5–15)
BUN: 37 mg/dL — ABNORMAL HIGH (ref 8–23)
CO2: 29 mmol/L (ref 22–32)
Calcium: 10.4 mg/dL — ABNORMAL HIGH (ref 8.9–10.3)
Chloride: 103 mmol/L (ref 98–111)
Creatinine, Ser: 1.48 mg/dL — ABNORMAL HIGH (ref 0.44–1.00)
GFR, Estimated: 34 mL/min — ABNORMAL LOW
Glucose, Bld: 93 mg/dL (ref 70–99)
Potassium: 3.7 mmol/L (ref 3.5–5.1)
Sodium: 141 mmol/L (ref 135–145)

## 2024-10-27 LAB — MAGNESIUM: Magnesium: 2 mg/dL (ref 1.7–2.4)

## 2024-10-27 MED ORDER — ATORVASTATIN CALCIUM 40 MG PO TABS
40.0000 mg | ORAL_TABLET | Freq: Every day | ORAL | 0 refills | Status: DC
  Filled 2024-10-27: qty 30, 30d supply, fill #0

## 2024-10-27 MED ORDER — FUROSEMIDE 40 MG PO TABS
40.0000 mg | ORAL_TABLET | Freq: Every day | ORAL | 0 refills | Status: DC | PRN
  Filled 2024-10-27: qty 30, 30d supply, fill #0

## 2024-10-27 NOTE — Plan of Care (Signed)

## 2024-10-27 NOTE — TOC Progression Note (Signed)
 Transition of Care [facility]) - Progression Note    Patient Details  Name: VALBORG FRIAR MRN: [id number] Date of Birth: 10-19-1941  [facility]) CM/SW Contact  Roxie KANDICE Stain, RN Phone Number: 10/27/2024, 11:41 AM  Clinical Narrative:     Spoke to patient's son, Sallee, regarding discharge disposition.  Jerod is now interested in [facility] and paying out of pocket.  Notified Lauraine, CSW who will have [facility] liaison call son.   Expected Discharge Plan: Home w Home Health Services Barriers to Discharge: Other (must enter comment) (Need to arrange caregivers at home)               Expected Discharge Plan and Services In-house Referral: Clinical Social Work   Post Acute Care Choice: Skilled Nursing Facility Living arrangements for the past 2 months: Single Family Home                                       Social Drivers of Health (SDOH) Interventions SDOH Screenings   Food Insecurity: No Food Insecurity (10/09/2024)  Housing: High Risk (06/20/2024)  Transportation Needs: Unmet Transportation Needs (06/20/2024)  Utilities: Not At Risk (10/09/2024)  Alcohol Screen: Low Risk (08/05/2022)  Depression (PHQ2-9): Low Risk (04/05/2024)  Financial Resource Strain: Low Risk (08/05/2022)  Physical Activity: Insufficiently Active (07/02/2022)  Social Connections: Moderately Integrated (06/20/2024)  Stress: No Stress Concern Present (07/02/2022)  Tobacco Use: Medium Risk (10/09/2024)    Readmission Risk Interventions    05/24/2024    8:57 AM 03/11/2024    1:07 PM  Readmission Risk Prevention Plan  Transportation Screening Complete Complete  PCP or Specialist Appt within 5-7 Days  Complete  Home Care Screening  Complete  Medication Review (RN CM)  Complete  HRI or Home Care Consult Complete   Social Work Consult for Recovery Care Planning/Counseling Complete   Palliative Care Screening Not Applicable   Medication Review Oceanographer) Complete

## 2024-10-27 NOTE — TOC Progression Note (Signed)
 Transition of Care [facility]) - Progression Note    Patient Details  Name: Barbara [name] MRN: [id number] Date of Birth: [birth year]  Transition [facility]) CM/SW Contact  Lauraine FORBES Saa, LCSWA Phone Number: 10/27/2024, 12:05 PM  Clinical Narrative:     12:05 PM CSW informed by [facility] of patient's son interest in paying privately for [facility]. CSW requested SNF to follow up with patient's son on billing. TOC will continue to follow.  Expected Discharge Plan: Skilled Nursing Facility Barriers to Discharge: Other (must enter comment) (SNF Private Pay)               Expected Discharge Plan and Services In-house Referral: Clinical Social Work   Post Acute Care Choice: Skilled Nursing Facility Living arrangements for the past 2 months: Single Family Home                                       Social Drivers of Health (SDOH) Interventions SDOH Screenings   Food Insecurity: No Food Insecurity (10/09/2024)  Housing: High Risk (06/20/2024)  Transportation Needs: Unmet Transportation Needs (06/20/2024)  Utilities: Not At Risk (10/09/2024)  Alcohol Screen: Low Risk (08/05/2022)  Depression (PHQ2-9): Low Risk (04/05/2024)  Financial Resource Strain: Low Risk (08/05/2022)  Physical Activity: Insufficiently Active (07/02/2022)  Social Connections: Moderately Integrated (06/20/2024)  Stress: No Stress Concern Present (07/02/2022)  Tobacco Use: Medium Risk (10/09/2024)    Readmission Risk Interventions    05/24/2024    8:57 AM 03/11/2024    1:07 PM  Readmission Risk Prevention Plan  Transportation Screening Complete Complete  PCP or Specialist Appt within 5-7 Days  Complete  Home Care Screening  Complete  Medication Review (RN CM)  Complete  HRI or Home Care Consult Complete   Social Work Consult for Recovery Care Planning/Counseling Complete   Palliative Care Screening Not Applicable   Medication Review Oceanographer) Complete

## 2024-10-27 NOTE — Plan of Care (Signed)
  Problem: Activity: Goal: Risk for activity intolerance will decrease Outcome: Progressing   Problem: Nutrition: Goal: Adequate nutrition will be maintained Outcome: Progressing   Problem: Coping: Goal: Level of anxiety will decrease Outcome: Progressing   Problem: Elimination: Goal: Will not experience complications related to bowel motility Outcome: Progressing Goal: Will not experience complications related to urinary retention Outcome: Progressing   Problem: Pain Managment: Goal: General experience of comfort will improve and/or be controlled Outcome: Progressing   Problem: Safety: Goal: Ability to remain free from injury will improve Outcome: Progressing   Problem: Skin Integrity: Goal: Risk for impaired skin integrity will decrease Outcome: Progressing   Problem: Activity: Goal: Ability to return to baseline activity level will improve Outcome: Progressing

## 2024-10-27 NOTE — Discharge Summary (Signed)
 " Physician Discharge Summary   Patient: Barbara [name] MRN: [id number] DOB: [birth year]  Admit date:     10/09/2024  Discharge date: 10/27/2024  Discharge Physician: Yetta Blanch  PCP: Johnny Garnette LABOR, MD  Recommendations at discharge: Follow-up with PCP in 1 week with repeat CBC and BMP.   Contact information for follow-up providers     Care, [facility] Follow up.   Specialty: Home Health Services Why: Home health and PCS aide Contact information: [address] [place] [address] [phone]         Johnny Garnette LABOR, MD. Schedule an appointment as soon as possible for a visit in 1 week(s).   Specialty: Family Medicine Why: with BMP lab to look at kidney/electrolyte numbers Contact information: [address] [place] [facility] [place] [address] [phone]              Contact information for after-discharge care     Destination     [facility] .   Service: Skilled Nursing Contact information: [address] [place]  [phone]             Home Medical Care     [facility]) .   Service: Home Health Services Contact information: [address] [place]  [address] [phone]                    Hospital Course: Patient with PMH of CAD, HTN, HLD, CKD 3A, PAF on Eliquis , sick sinus syndrome SP pacemaker presented to the hospital with complaints of chest pain. Cardiology was consulted upon admission for chest pain.  Elevated troponins were seen.  Underwent left heart catheterization.  Recommendation was for medical management only.  Assessment and plan. CAD. Chest pain with elevated troponin. Prior history of PCI to LAD with BMS. Troponin peaked at 105. Echocardiogram with EF 6065%. Cardiology was consulted.  Underwent left heart cath which showed nonobstructive CAD. (15% distal left main, 40% proximal LCx, 30% proximal to mid RCA, 30%  ostial to proximal LAD).  Recommendation is for medical management only. Continue Lopressor  25 mg twice daily, Eliquis  2.5 mg twice daily, no antiplatelet medication.  HLD. Myalgia. Patient had myalgia with rosuvastatin .  Cardiology currently recommending to attempt Lipitor. If has myalgia on Lipitor, patient will require PCSK9 inhibitor referral.  Acute on chronic diastolic CHF. EF 60 to 65%. Treated with IV Lasix . Later on was transition to oral Lasix  on a daily basis. Currently will transition to Lasix  as needed only.  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  Currently on beta-blocker. Monitor.  SSS SP PPM implant. History of paroxysmal A-fib. On anticoagulation with Eliquis . Currently has a pacemaker. Outpatient follow-up with EP. Currently on amiodarone  continue.  CKD 3B. Baseline creatinine fluctuating between 1.3-1.6 chronically. Serum creatinine stable. Lasix  as needed.  Mild hypercalcemia. Mildly elevated. Likely in the setting of multivitamin use. Recheck in 1 week.  Consultants:  Cardiology  Procedures performed:  Echocardiogram Left heart Cardiac Catheterization   DISCHARGE MEDICATION: Allergies as of 10/27/2024       Reactions   Lidocaine  Anaphylaxis   Morphine  Other (See Comments)   Body shuts down   Procaine Hcl Anaphylaxis, Rash, Other (See Comments)   Anything with 'caine' in it    Sulfonamide Derivatives Hives   Amlodipine  Swelling   Ezetimibe-simvastatin Other (See Comments)   VYTORIN = Joint pain   Latex Itching, Other (See Comments)   WELTS  Norco [hydrocodone -acetaminophen ] Nausea And Vomiting, Other (See Comments)   States does ok with IV form    Tape Itching, Other (See Comments)   Patient prefers either paper tape or Coban wrap   Tramadol  Nausea And Vomiting        Medication List     STOP taking these medications    losartan  50 MG tablet Commonly known as: COZAAR    megestrol  40 MG tablet Commonly known as:  MEGACE    sucralfate  1 GM/10ML suspension Commonly known as: CARAFATE        TAKE these medications    acetaminophen  325 MG tablet Commonly known as: TYLENOL  Take 2 tablets (650 mg total) by mouth every 6 (six) hours as needed for mild pain (pain score 1-3) or fever (or Fever >/= 101).   alum & mag hydroxide-simeth 200-200-20 MG/5ML suspension Commonly known as: MAALOX/MYLANTA Take 30 mLs by mouth every 4 (four) hours as needed for indigestion.   amiodarone  200 MG tablet Commonly known as: PACERONE  Take 1 tablet (200 mg total) by mouth daily.   apixaban  2.5 MG Tabs tablet Commonly known as: Eliquis  Take 1 tablet (2.5 mg total) by mouth 2 (two) times daily.   atorvastatin  40 MG tablet Commonly known as: LIPITOR Take 1 tablet (40 mg total) by mouth daily. Start taking on: October 28, 2024   BOOST/FIBER PO Take 1 Bottle by mouth 3 (three) times daily.   CertaVite/Antioxidants Tabs Take 1 tablet by mouth daily.   cyanocobalamin  500 MCG tablet Commonly known as: VITAMIN B12 Take 500 mcg by mouth daily.   empagliflozin  10 MG Tabs tablet Commonly known as: Jardiance  Take 1 tablet (10 mg total) by mouth daily.   furosemide  40 MG tablet Commonly known as: Lasix  Take 1 tablet (40 mg total) by mouth daily as needed (For weight gain of more than 3 pounds in 1 day or more than 5 pounds in 1 week).   ipratropium 0.06 % nasal spray Commonly known as: ATROVENT  Place 2 sprays into both nostrils 2 (two) times daily as needed (allergies).   isosorbide  mononitrate 30 MG 24 hr tablet Commonly known as: IMDUR  Take 1 tablet (30 mg total) by mouth daily.   ketoconazole  2 % cream Commonly known as: NIZORAL  Apply 1 Application topically 2 (two) times daily as needed for irritation.   loratadine  10 MG tablet Commonly known as: CLARITIN  Take 10 mg by mouth daily as needed for allergies.   metoprolol  tartrate 25 MG tablet Commonly known as: LOPRESSOR  Take 1 tablet (25 mg total)  by mouth 2 (two) times daily.   mirtazapine  15 MG tablet Commonly known as: REMERON  Take 1 tablet (15 mg total) by mouth at bedtime.   nitroGLYCERIN  0.4 MG SL tablet Commonly known as: NITROSTAT  Place 1 tablet (0.4 mg total) under the tongue every 5 (five) minutes as needed for chest pain.   pantoprazole  40 MG tablet Commonly known as: PROTONIX  Take 1 tablet (40 mg total) by mouth daily.   senna-docusate 8.6-50 MG tablet Commonly known as: Senokot-S Take 1 tablet by mouth 2 (two) times daily. What changed: when to take this   triamcinolone  55 MCG/ACT nasal inhaler Commonly known as: NASACORT Place 1 spray into both nostrils daily as needed (for allergies).       Disposition: SNF Diet recommendation: Cardiac diet  Discharge Exam: Vitals:   10/26/24 1933 10/26/24 2247 10/27/24 0343 10/27/24 0743  BP: 112/83 (!) 115/56 139/73 (!) 139/59  Pulse: 75 71 68   Resp: 19 18 16  18  Temp: 98.1 F (36.7 C) (!) 97.5 F (36.4 C) 98.6 F (37 C)   TempSrc: Oral Oral Oral   SpO2: 96% 95% 96%   Weight:   60.6 kg   Height:       General: in Mild distress, No Rash Cardiovascular: S1 and S2 Present, No Murmur Respiratory: Good respiratory effort, Bilateral Air entry present. No Crackles, No wheezes Abdomen: Bowel Sound present, No tenderness Extremities: No edema Neuro: Alert and oriented x3, no new focal deficit   Filed Weights   10/25/24 0358 10/26/24 0352 10/27/24 0343  Weight: 57.7 kg 59.1 kg 60.6 kg   Condition at discharge: stable  The results of significant diagnostics from this hospitalization (including imaging, microbiology, ancillary and laboratory) are listed below for reference.   Imaging Studies: DG CHEST PORT 1 VIEW Result Date: 10/13/2024 EXAM: 1 VIEW(S) XRAY OF THE CHEST 10/13/2024 02:03:44 AM COMPARISON: 10/09/2024 CLINICAL HISTORY: Rales. FINDINGS: LINES, TUBES AND DEVICES: Left subclavian pacemaker. LUNGS AND PLEURA: Mild patchy right upper and left lower  lobe opacities, suspicious for pneumonia. No pleural effusion. No pneumothorax. HEART AND MEDIASTINUM: Left subclavian pacemaker. No acute abnormality of the cardiac and mediastinal silhouettes. BONES AND SOFT TISSUES: Right shoulder arthroplasty. No acute osseous abnormality. IMPRESSION: 1. Mild patchy right upper and left lower lobe opacities, suspicious for pneumonia. Electronically signed by: Pinkie Pebbles MD 10/13/2024 02:07 AM EST RP Workstation: [id number]   CARDIAC CATHETERIZATION Result Date: 10/11/2024   Dist LM lesion is 15% stenosed.   Prox Cx lesion is 40% stenosed.   Prox RCA to Mid RCA lesion is 30% stenosed.   Ost LAD to Prox LAD lesion is 30% stenosed. Patent proximal LAD stent with minimal restenosis Mild non-obstructive disease in the mid Circumflex at the ostium of the obtuse marginal branch Mild non-obstructive disease in the mid RCA LVEDP 8 mmHg Recommendations: Continue medical management of CAD   ECHOCARDIOGRAM COMPLETE Result Date: 10/10/2024    ECHOCARDIOGRAM REPORT   Patient Name:   DAWNETTA COPENHAVER Date of Exam: 10/10/2024 Medical Rec #:  [id number]              Height:       68.0 in Accession #:    [id number]             Weight:       132.5 lb Date of Birth:  [birth year]              BSA:          1.716 m Patient Age:    83 years               BP:           132/48 mmHg Patient Gender: F                      HR:           60 bpm. Exam Location:  Inpatient Procedure: 2D Echo, Color Doppler, Cardiac Doppler and Intracardiac            Opacification Agent (Both Spectral and Color Flow Doppler were            utilized during procedure). Indications:    CHF Acute Diastolic I50.31  History:        Patient has prior history of Echocardiogram examinations, most                 recent 05/18/2024.  Sonographer:  Tinnie Gosling RDCS Referring Phys: ROLLO JONELLE LOUDER IMPRESSIONS  1. Small spade like ventricular cavity. 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 Left ventricular ejection fraction, by estimation, is 60 to 65%. The left ventricle has normal function. The left ventricle has no regional wall motion abnormalities. There is mild left ventricular hypertrophy. Left ventricular diastolic parameters are consistent with Grade II diastolic dysfunction (pseudonormalization). Elevated left ventricular end-diastolic pressure.  2. Device leads in RA/RV. Right ventricular systolic function is normal. The right ventricular size is normal.  3. Left atrial size was moderately dilated.  4. The mitral valve is abnormal. Mild mitral valve regurgitation. No evidence of mitral stenosis.  5. Calcified non coronary cusp. The aortic valve is tricuspid. There is moderate calcification of the aortic valve. There is moderate thickening of the aortic valve. Aortic valve regurgitation is not visualized. Aortic valve sclerosis is present, with no evidence of aortic valve stenosis.  6. The inferior vena cava is normal in size with greater than 50% respiratory variability, suggesting right atrial pressure of 3 mmHg. FINDINGS  Left Ventricle: Small spade like ventricular cavity. 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 Left ventricular ejection fraction, by  estimation, is 60 to 65%. The left ventricle has normal function. The left ventricle has no regional wall motion abnormalities. Strain was performed and the global longitudinal strain is indeterminate. The left ventricular internal cavity size was normal in size. There is mild left ventricular hypertrophy. Left ventricular diastolic parameters are consistent with Grade II diastolic dysfunction (pseudonormalization). Elevated left ventricular end-diastolic pressure. Right Ventricle: Device leads in RA/RV. The right ventricular size  is normal. No increase in right ventricular wall thickness. Right ventricular systolic function is normal. Left Atrium: Left atrial size was moderately dilated. Right Atrium: Right atrial size was normal in size. Pericardium: There is no evidence of pericardial effusion. Mitral Valve: The mitral valve is abnormal. There is mild thickening of the mitral valve leaflet(s). There is mild calcification of the mitral valve leaflet(s). Mild mitral valve regurgitation. No evidence of mitral valve stenosis. Tricuspid Valve: The tricuspid valve is normal in structure. Tricuspid valve regurgitation is mild . No evidence of tricuspid stenosis. Aortic Valve: Calcified non coronary cusp. The aortic valve is tricuspid. There is moderate calcification of the aortic valve. There is moderate thickening of the aortic valve. Aortic valve regurgitation is not visualized. Aortic valve sclerosis is present, with no evidence of aortic valve stenosis. Pulmonic Valve: The pulmonic valve was normal in structure. Pulmonic valve regurgitation is not visualized. No evidence of pulmonic stenosis. Aorta: The aortic root is normal in size and structure. Venous: The inferior vena cava is normal in size with greater than 50% respiratory variability, suggesting right atrial pressure of 3 mmHg. IAS/Shunts: No atrial level shunt detected by color flow Doppler. Additional Comments: 3D was performed not requiring image post processing on an independent workstation and was indeterminate.  LEFT VENTRICLE PLAX 2D LVIDd:         4.10 cm   Diastology LVIDs:         2.70 cm   LV e' medial:    4.68 cm/s LV PW:         1.20 cm   LV E/e' medial:  18.4 LV IVS:        1.20 cm   LV e' lateral:  6.42 cm/s LVOT diam:     1.60 cm   LV E/e' lateral: 13.4 LV SV:         50 LV SV Index:   29 LVOT Area:     2.01 cm LV IVRT:       113 msec  RIGHT VENTRICLE             IVC RV S prime:     12.40 cm/s  IVC diam: 1.10 cm LEFT ATRIUM             Index        RIGHT ATRIUM            Index LA diam:        4.30 cm 2.51 cm/m   RA Area:     13.10 cm LA Vol (A2C):   75.1 ml 43.77 ml/m  RA Volume:   31.80 ml  18.54 ml/m LA Vol (A4C):   86.3 ml 50.30 ml/m LA Biplane Vol: 80.4 ml 46.86 ml/m  AORTIC VALVE LVOT Vmax:   138.00 cm/s LVOT Vmean:  94.500 cm/s LVOT VTI:    0.249 m  AORTA Ao Root diam: 2.30 cm Ao Asc diam:  2.80 cm MITRAL VALVE MV Area (PHT): 2.46 cm    SHUNTS MV Decel Time: 308 msec    Systemic VTI:  0.25 m MV E velocity: 85.90 cm/s  Systemic Diam: 1.60 cm MV A velocity: 44.50 cm/s MV E/A ratio:  1.93 Maude Emmer MD Electronically signed by Maude Emmer MD Signature Date/Time: 10/10/2024/3:54:14 PM    Final    DG Lumbar Spine Complete Result Date: 10/09/2024 CLINICAL DATA:  Low back pain following fall. EXAM: LUMBAR SPINE - COMPLETE 4+ VIEW COMPARISON:  None Available. FINDINGS: There is no evidence of acute lumbar spine fracture. Alignment is normal. Multilevel intervertebral disc space narrowing, degenerative endplate changes, and facet arthropathy are noted. There is atherosclerotic calcification of the aorta. Total hip arthroplasty changes are noted on the left. 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 Electronically Signed   By: Leita Birmingham M.D.   On: 10/09/2024 16:53   DG Chest 2 View Result Date: 10/09/2024 CLINICAL DATA:  Low back pain after fall.  Chest pain. EXAM: CHEST - 2 VIEW, LUMBAR SPINE COMPARISON:  06/20/2024, 07/03/2024. FINDINGS: Chest: The heart is enlarged and the mediastinal contour is within normal limits. A dual lead pacemaker is present over the left chest. There is eventration of the posterior left diaphragm with a associated atelectasis. No consolidation, effusion, or pneumothorax is seen. Degenerative changes are present in the thoracic spine. Shoulder arthroplasty changes are noted on the right. Lumbar spine: There is no evidence of acute fracture in the lumbar spine. Alignment appears  normal. Multilevel intervertebral disc space narrowing, degenerative endplate changes, and facet arthropathy are noted. There is atherosclerotic calcification of the aorta. 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 IMPRESSION: 1. No active cardiopulmonary disease. 2. No acute fracture or subluxation in the lumbar spine. Electronically Signed   By: Leita Birmingham M.D.   On: 10/09/2024 15:18    Microbiology: Results for orders placed or performed during the hospital encounter of 10/09/24  Resp panel by RT-PCR (RSV, Flu A&B, Covid) Anterior Nasal Swab     Status: None   Collection Time: 10/09/24  4:10 PM   Specimen: Anterior Nasal Swab  Result Value Ref Range Status   SARS Coronavirus 2 by RT PCR NEGATIVE NEGATIVE Final  Influenza A by PCR NEGATIVE NEGATIVE Final   Influenza B by PCR NEGATIVE NEGATIVE Final    Comment: (NOTE) The Xpert Xpress SARS-CoV-2/FLU/RSV plus assay is intended as an aid in the diagnosis of influenza from Nasopharyngeal swab specimens and should not be used as a sole basis for treatment. Nasal washings and aspirates are unacceptable for Xpert Xpress SARS-CoV-2/FLU/RSV testing.  Fact Sheet for Patients: bloggercourse.com  Fact Sheet for Healthcare Providers: seriousbroker.it  This test is not yet approved or cleared by the [place]  FDA and has been authorized for detection and/or diagnosis of SARS-CoV-2 by FDA under an Emergency Use Authorization (EUA). This EUA will remain in effect (meaning this test can be used) for the duration of the COVID-19 declaration under Section 564(b)(1) of the Act, 21 U.S.C. section 360bbb-3(b)(1), unless the authorization is terminated or revoked.     Resp Syncytial Virus by PCR NEGATIVE NEGATIVE Final    Comment: (NOTE) Fact Sheet for Patients: bloggercourse.com  Fact Sheet for Healthcare  Providers: seriousbroker.it  This test is not yet approved or cleared by the [place]  FDA and has been authorized for detection and/or diagnosis of SARS-CoV-2 by FDA under an Emergency Use Authorization (EUA). This EUA will remain in effect (meaning this test can be used) for the duration of the COVID-19 declaration under Section 564(b)(1) of the Act, 21 U.S.C. section 360bbb-3(b)(1), unless the authorization is terminated or revoked.  Performed at [facility], [address], [place], [place] [address]   Urine Culture (for pregnant, neutropenic or urologic patients or patients with an indwelling urinary catheter)     Status: Abnormal   Collection Time: 10/09/24  8:44 PM   Specimen: Urine, Clean Catch  Result Value Ref Range Status   Specimen Description URINE, CLEAN CATCH  Final   Special Requests   Final    NONE Performed at [facility], [address] [place], [place] [address]    Culture 30,000 COLONIES/mL PROTEUS MIRABILIS (A)  Final   Report Status 10/11/2024 FINAL  Final   Organism ID, Bacteria PROTEUS MIRABILIS (A)  Final      Susceptibility   Proteus mirabilis - MIC*    AMPICILLIN <=2 SENSITIVE Sensitive     CEFAZOLIN  (URINE) Value in next row Sensitive      4 SENSITIVEThis is a modified FDA-approved test that has been validated and its performance characteristics determined by the reporting laboratory.  This laboratory is certified under the Clinical Laboratory Improvement Amendments CLIA as qualified to perform high complexity clinical laboratory testing.    CEFEPIME Value in next row Sensitive      4 SENSITIVEThis is a modified FDA-approved test that has been validated and its performance characteristics determined by the reporting laboratory.  This laboratory is certified under the Clinical Laboratory Improvement Amendments CLIA as qualified to perform high complexity clinical laboratory testing.    ERTAPENEM Value in next row  Sensitive      4 SENSITIVEThis is a modified FDA-approved test that has been validated and its performance characteristics determined by the reporting laboratory.  This laboratory is certified under the Clinical Laboratory Improvement Amendments CLIA as qualified to perform high complexity clinical laboratory testing.    CEFTRIAXONE  Value in next row Sensitive      4 SENSITIVEThis is a modified FDA-approved test that has been validated and its performance characteristics determined by the reporting laboratory.  This laboratory is certified under the Clinical Laboratory Improvement Amendments CLIA as qualified to perform high complexity  clinical laboratory testing.    CIPROFLOXACIN  Value in next row Sensitive      4 SENSITIVEThis is a modified FDA-approved test that has been validated and its performance characteristics determined by the reporting laboratory.  This laboratory is certified under the Clinical Laboratory Improvement Amendments CLIA as qualified to perform high complexity clinical laboratory testing.    GENTAMICIN  Value in next row Sensitive      4 SENSITIVEThis is a modified FDA-approved test that has been validated and its performance characteristics determined by the reporting laboratory.  This laboratory is certified under the Clinical Laboratory Improvement Amendments CLIA as qualified to perform high complexity clinical laboratory testing.    NITROFURANTOIN Value in next row Resistant      4 SENSITIVEThis is a modified FDA-approved test that has been validated and its performance characteristics determined by the reporting laboratory.  This laboratory is certified under the Clinical Laboratory Improvement Amendments CLIA as qualified to perform high complexity clinical laboratory testing.    TRIMETH/SULFA Value in next row Sensitive      4 SENSITIVEThis is a modified FDA-approved test that has been validated and its performance characteristics determined by the reporting laboratory.   This laboratory is certified under the Clinical Laboratory Improvement Amendments CLIA as qualified to perform high complexity clinical laboratory testing.    AMPICILLIN/SULBACTAM Value in next row Sensitive      4 SENSITIVEThis is a modified FDA-approved test that has been validated and its performance characteristics determined by the reporting laboratory.  This laboratory is certified under the Clinical Laboratory Improvement Amendments CLIA as qualified to perform high complexity clinical laboratory testing.    PIP/TAZO Value in next row Sensitive      <=4 SENSITIVEThis is a modified FDA-approved test that has been validated and its performance characteristics determined by the reporting laboratory.  This laboratory is certified under the Clinical Laboratory Improvement Amendments CLIA as qualified to perform high complexity clinical laboratory testing.    MEROPENEM Value in next row Sensitive      <=4 SENSITIVEThis is a modified FDA-approved test that has been validated and its performance characteristics determined by the reporting laboratory.  This laboratory is certified under the Clinical Laboratory Improvement Amendments CLIA as qualified to perform high complexity clinical laboratory testing.    * 30,000 COLONIES/mL PROTEUS MIRABILIS  MRSA Next Gen by PCR, Nasal     Status: None   Collection Time: 10/09/24  8:44 PM   Specimen: Urine, Clean Catch; Nasal Swab  Result Value Ref Range Status   MRSA by PCR Next Gen NOT DETECTED NOT DETECTED Final    Comment: (NOTE) The GeneXpert MRSA Assay (FDA approved for NASAL specimens only), is one component of a comprehensive MRSA colonization surveillance program. It is not intended to diagnose MRSA infection nor to guide or monitor treatment for MRSA infections. Test performance is not FDA approved in patients less than 60 years old. Performed at [facility], [address], [place], [place] [address]    Labs: CBC: Recent Labs  Lab  10/22/24 0259 10/27/24 0220  WBC 5.3 5.4  HGB 14.3 13.7  HCT 44.7 42.1  MCV 89.9 88.3  PLT 290 212   Basic Metabolic Panel: Recent Labs  Lab 10/22/24 0259 10/27/24 0220  NA 141 141  K 3.7 3.7  CL 101 103  CO2 30 29  GLUCOSE 81 93  BUN 33* 37*  CREATININE 1.41* 1.48*  CALCIUM  11.4* 10.4*  MG 2.3 2.0   Liver Function Tests:  No results for input(s): AST, ALT, ALKPHOS, BILITOT, PROT, ALBUMIN in the last 168 hours. CBG: No results for input(s): GLUCAP in the last 168 hours.  Discharge time spent: 35 minutes  Author: Yetta Blanch, MD  [facility]    "

## 2024-10-27 NOTE — Progress Notes (Signed)
 Physical Therapy Treatment Patient Details Name: Barbara [name] MRN: [id number] DOB: [birth year] Today's Date: 10/27/2024   History of Present Illness 83 yo female adm 12/6 with SOB and chest pain. Admitted for NSTEMI and acute CHF exacerbation. PMH includes: CAD, HFpEF, A-fib s/p PPM, essential HTN, HLD, anxiety, CKD, anemia, Rt TSA, vertigo, Lt femur fx    PT Comments  Pt agreeable to session this afternoon. Pt with fluctuating orientation through session, as pt initially reports frustration that her breakfast has not yet arrived (time was 2:00PM), and later in session states breakfast came at 6:00AM so she didn't eat it. The pt continues to demo good ability to complete mobility to EOB without assist (but does depend on bed features), but is dependent on minA for sit-stands and to return LE to bed. Pt also completed hallway ambulation with both use of upright rollator and RW, needing minA at times to steady, especially with turning. The pt was educated on LE strengthening for quad and hip flexors to improve strength to return LE to bed on her own. Recommendations remain appropriate.    If plan is discharge home, recommend the following: A little help with walking and/or transfers;A little help with bathing/dressing/bathroom;Assistance with cooking/housework;Assist for transportation;Help with stairs or ramp for entrance   Can travel by private vehicle        Equipment Recommendations  None recommended by PT    Recommendations for Other Services       Precautions / Restrictions Precautions Precautions: Fall Recall of Precautions/Restrictions: Intact Restrictions Weight Bearing Restrictions Per Provider Order: No     Mobility  Bed Mobility Overal bed mobility: Needs Assistance Bed Mobility: Supine to Sit, Sit to Supine     Supine to sit: Contact guard, HOB elevated, Used rails Sit to supine: Min assist, HOB elevated   General bed mobility comments: pt able to come to  EOB from elevated head and use of rail. Pt has adjustable bed at home. Pt unable to get back in bed without min A to LE's    Transfers Overall transfer level: Needs assistance Equipment used: Rolling walker (2 wheels), Bilateral platform walker Transfers: Sit to/from Stand Sit to Stand: Min assist           General transfer comment: minA to CGA dependign on height of chair, pt reports using lift chair at home. increased dependence on UE support to rise, poor eccentric lower    Ambulation/Gait Ambulation/Gait assistance: Min assist Gait Distance (Feet): 75 Feet (+ 150) Assistive device: Standup Rollator, Rolling walker (2 wheels) Gait Pattern/deviations: Step-to pattern, Decreased step length - right, Decreased step length - left, Trunk flexed, Narrow base of support, Shuffle Gait velocity: decreased Gait velocity interpretation: <1.31 ft/sec, indicative of household ambulator   General Gait Details: pt with small shuffling steps with L knee knocking R knee during stance. pt reports this is due to arthritis. pt reports use of upright walker at chirch only, uses RW at home and therefore switched to RW for further practice.   Stairs             Wheelchair Mobility     Tilt Bed    Modified Rankin (Stroke Patients Only)       Balance Overall balance assessment: Needs assistance Sitting-balance support: No upper extremity supported, Feet supported Sitting balance-Leahy Scale: Good Sitting balance - Comments: seated EOB   Standing balance support: Bilateral upper extremity supported, During functional activity Standing balance-Leahy Scale: Poor Standing balance comment: Pt needs BUE support  on RW or stable surface.                            Communication Communication Communication: No apparent difficulties  Cognition Arousal: Alert Behavior During Therapy: WFL for tasks assessed/performed   PT - Cognitive impairments: No family/caregiver present to  determine baseline, Initiation, Sequencing, Problem solving, Safety/Judgement, Orientation   Orientation impairments: Time                   PT - Cognition Comments: pt reporting frustration that she has not had breakfast yest (it was 2:00PM), helped pt order lunch/dinner. poor conversational awareness and memory regarding use of DME Following commands: Impaired Following commands impaired: Follows one step commands with increased time    Cueing Cueing Techniques: Verbal cues, Gestural cues  Exercises      General Comments General comments (skin integrity, edema, etc.): HR to 101bpm, other VSS on RA      Pertinent Vitals/Pain Pain Assessment Pain Assessment: Faces Faces Pain Scale: Hurts little more Pain Location: knees Pain Descriptors / Indicators: Aching     PT Goals (current goals can now be found in the care plan section) Acute Rehab PT Goals Patient Stated Goal: to improve strength and go home Progress towards PT goals: Progressing toward goals    Frequency    Min 2X/week       AM-PAC PT 6 Clicks Mobility   Outcome Measure  Help needed turning from your back to your side while in a flat bed without using bedrails?: A Little Help needed moving from lying on your back to sitting on the side of a flat bed without using bedrails?: A Little Help needed moving to and from a bed to a chair (including a wheelchair)?: A Little Help needed standing up from a chair using your arms (e.g., wheelchair or bedside chair)?: A Lot Help needed to walk in hospital room?: A Lot Help needed climbing 3-5 steps with a railing? : A Lot 6 Click Score: 15    End of Session Equipment Utilized During Treatment: Gait belt Activity Tolerance: Patient tolerated treatment well Patient left: with call bell/phone within reach;in bed;with bed alarm set Nurse Communication: Mobility status PT Visit Diagnosis: Unsteadiness on feet (R26.81);Other abnormalities of gait and mobility  (R26.89);Repeated falls (R29.6);Muscle weakness (generalized) (M62.81)     Time: 1400-1440 PT Time Calculation (min) (ACUTE ONLY): 40 min  Charges:    $Gait Training: 8-22 mins $Therapeutic Exercise: 8-22 mins $Therapeutic Activity: 8-22 mins PT General Charges $$ ACUTE PT VISIT: 1 Visit                     Izetta Call, PT, DPT   Acute Rehabilitation Department Office [phone] Secure Chat Communication Preferred   Izetta JULIANNA Call 10/27/2024, 3:46 PM

## 2024-10-28 DIAGNOSIS — I200 Unstable angina: Secondary | ICD-10-CM | POA: Diagnosis not present

## 2024-10-28 LAB — RENAL FUNCTION PANEL
Albumin: 3.4 g/dL — ABNORMAL LOW (ref 3.5–5.0)
Anion gap: 11 (ref 5–15)
BUN: 31 mg/dL — ABNORMAL HIGH (ref 8–23)
CO2: 27 mmol/L (ref 22–32)
Calcium: 10.2 mg/dL (ref 8.9–10.3)
Chloride: 103 mmol/L (ref 98–111)
Creatinine, Ser: 1.5 mg/dL — ABNORMAL HIGH (ref 0.44–1.00)
GFR, Estimated: 34 mL/min — ABNORMAL LOW
Glucose, Bld: 90 mg/dL (ref 70–99)
Phosphorus: 2.8 mg/dL (ref 2.5–4.6)
Potassium: 3.2 mmol/L — ABNORMAL LOW (ref 3.5–5.1)
Sodium: 141 mmol/L (ref 135–145)

## 2024-10-28 MED ORDER — RENA-VITE PO TABS: 1.0000 | ORAL_TABLET | Freq: Every day | ORAL | 0 refills | Status: DC

## 2024-10-28 MED ORDER — POTASSIUM CHLORIDE CRYS ER 20 MEQ PO TBCR
30.0000 meq | EXTENDED_RELEASE_TABLET | Freq: Once | ORAL | Status: AC
  Administered 2024-10-28: 30 meq via ORAL
  Filled 2024-10-28: qty 1

## 2024-10-28 MED ORDER — MIRTAZAPINE 7.5 MG PO TABS
15.0000 mg | ORAL_TABLET | Freq: Every day | ORAL | Status: DC
  Administered 2024-10-28 – 2024-11-05 (×9): 15 mg via ORAL
  Filled 2024-10-28 (×4): qty 2

## 2024-10-28 MED ORDER — RENA-VITE PO TABS
1.0000 | ORAL_TABLET | Freq: Every day | ORAL | Status: DC
  Administered 2024-10-28 – 2024-11-05 (×9): 1 via ORAL
  Filled 2024-10-28 (×4): qty 1

## 2024-10-28 NOTE — Progress Notes (Signed)
 [facility] PROGRESS NOTE  Patient: Barbara [name] FMW:[id number]   PCP: Johnny Garnette LABOR, MD DOB: 10/27/1941   DOA: 10/09/2024   DOS: 10/28/2024    Subjective: No acute complaint.  No nausea no vomiting.  Objective:  Vitals:   10/28/24 0345 10/28/24 0753 10/28/24 1212 10/28/24 1510  BP: 117/69 139/71 115/78 (!) 110/98  Pulse: 80 74 86 83  Resp: 18 18 20 20   Temp: 97.9 F (36.6 C) 97.8 F (36.6 C) 97.8 F (36.6 C) 97.8 F (36.6 C)  TempSrc: Oral Oral Oral Oral  SpO2: 95% 100% 95% 97%  Weight: 61.2 kg     Height:       Unchanged basal crackles. S1-S2 present No significant edema of lower extremity. Reviewed BMP.  Stable serum creatinine and lower potassium.  Assessment and plan: Hypokalemia. Corrected.  CKD 3B. Renal function remains stable. For now monitor weekly.  Hypercalcemia. Corrected. Multivitamin regimen modified for Rena-Vite.  Remains medically stable.  Author: Yetta Blanch, MD [facility] 10/28/2024 5:43 PM   If 7PM-7AM, please contact night-coverage at [email or web address]

## 2024-10-29 ENCOUNTER — Other Ambulatory Visit (HOSPITAL_COMMUNITY): Payer: Self-pay

## 2024-10-29 DIAGNOSIS — I200 Unstable angina: Secondary | ICD-10-CM | POA: Diagnosis not present

## 2024-10-29 MED ORDER — FUROSEMIDE 40 MG PO TABS: 40.0000 mg | ORAL_TABLET | ORAL | Status: DC

## 2024-10-29 NOTE — Plan of Care (Signed)
  Problem: Education: Goal: Knowledge of General Education information will improve Description: Including pain rating scale, medication(s)/side effects and non-pharmacologic comfort measures Outcome: Progressing   Problem: Health Behavior/Discharge Planning: Goal: Ability to manage health-related needs will improve Outcome: Progressing   Problem: Clinical Measurements: Goal: Respiratory complications will improve Outcome: Progressing Goal: Cardiovascular complication will be avoided Outcome: Progressing   Problem: Nutrition: Goal: Adequate nutrition will be maintained Outcome: Progressing   Problem: Coping: Goal: Level of anxiety will decrease Outcome: Progressing   Problem: Elimination: Goal: Will not experience complications related to bowel motility Outcome: Progressing Goal: Will not experience complications related to urinary retention Outcome: Progressing   Problem: Pain Managment: Goal: General experience of comfort will improve and/or be controlled Outcome: Progressing   Problem: Safety: Goal: Ability to remain free from injury will improve Outcome: Progressing   Problem: Skin Integrity: Goal: Risk for impaired skin integrity will decrease Outcome: Progressing

## 2024-10-29 NOTE — TOC Progression Note (Signed)
 Transition of Care [facility]) - Progression Note    Patient Details  Name: Barbara [name] MRN: [id number] Date of Birth: [birth year]  Transition [facility] CM/SW Contact  Lauraine FORBES Saa, LCSWA Phone Number: 10/29/2024, 1:39 PM  Clinical Narrative:     1:39 PM CSW sent reminder to [facility] SNF for them to discuss private pay with patient's son, Sallee. TOC will continue to follow.  Expected Discharge Plan: Skilled Nursing Facility Barriers to Discharge: Other (must enter comment) (SNF Private Pay)               Expected Discharge Plan and Services In-house Referral: Clinical Social Work   Post Acute Care Choice: Skilled Nursing Facility Living arrangements for the past 2 months: Single Family Home                                       Social Drivers of Health (SDOH) Interventions SDOH Screenings   Food Insecurity: No Food Insecurity (10/09/2024)  Housing: High Risk (06/20/2024)  Transportation Needs: Unmet Transportation Needs (06/20/2024)  Utilities: Not At Risk (10/09/2024)  Alcohol Screen: Low Risk (08/05/2022)  Depression (PHQ2-9): Low Risk (04/05/2024)  Financial Resource Strain: Low Risk (08/05/2022)  Physical Activity: Insufficiently Active (07/02/2022)  Social Connections: Moderately Integrated (06/20/2024)  Stress: No Stress Concern Present (07/02/2022)  Tobacco Use: Medium Risk (10/09/2024)    Readmission Risk Interventions    05/24/2024    8:57 AM 03/11/2024    1:07 PM  Readmission Risk Prevention Plan  Transportation Screening Complete Complete  PCP or Specialist Appt within 5-7 Days  Complete  Home Care Screening  Complete  Medication Review (RN CM)  Complete  HRI or Home Care Consult Complete   Social Work Consult for Recovery Care Planning/Counseling Complete   Palliative Care Screening Not Applicable   Medication Review Oceanographer) Complete

## 2024-10-29 NOTE — Progress Notes (Signed)
 [facility] PROGRESS NOTE  Patient: Barbara [name] FMW:[id number]   PCP: Barbara Garnette LABOR, MD DOB: 04/07/1941   DOA: 10/09/2024   DOS: 10/29/2024    Subjective: No nausea or vomiting.  Objective:  Vitals:   10/29/24 0610 10/29/24 0808 10/29/24 1132 10/29/24 1152  BP:  119/60 122/67 122/67  Pulse:    80  Resp:  15 16   Temp:  98.1 F (36.7 C) 98.2 F (36.8 C)   TempSrc:  Oral Oral   SpO2:  98% 97%   Weight: 58.9 kg     Height:       Chronic hoarseness of voice. S1-S2 present Bowel sound present No significant edema lower extremity.  Assessment and plan: Remains medically stable  awaiting placement for SNF.  Author: Yetta Blanch, MD [facility] 10/29/2024 6:42 PM   If 7PM-7AM, please contact night-coverage at [email or web address]

## 2024-10-29 NOTE — Progress Notes (Signed)
 Mobility Specialist Progress Note;   10/29/24 1023  Mobility  Activity Ambulated with assistance  Level of Assistance Minimal assist, patient does 75% or more  Assistive Device Front wheel walker  Distance Ambulated (ft) 150 ft (+50)  Activity Response Tolerated well  Mobility Referral Yes  Mobility visit 1 Mobility  Mobility Specialist Start Time (ACUTE ONLY) 1023  Mobility Specialist Stop Time (ACUTE ONLY) 1044  Mobility Specialist Time Calculation (min) (ACUTE ONLY) 21 min   Pt agreeable to mobility. Required MinA for bed mobility w/ use of bed pads and bed features, MinG during ambulation. HR up to 126 bpm w/ exertion. Took 1x standing rest break after ~14ft d/t fatigue, able to ambulate back to room w/o fault. Pt returned back to bed and left with all needs met, alarm on.   Lauraine Erm Mobility Specialist Please contact via SecureChat or [employer] [phone]

## 2024-10-29 NOTE — Plan of Care (Signed)
" °  Problem: Education: Goal: Knowledge of General Education information will improve Description: Including pain rating scale, medication(s)/side effects and non-pharmacologic comfort measures Outcome: Progressing   Problem: Clinical Measurements: Goal: Will remain free from infection Outcome: Progressing   Problem: Activity: Goal: Risk for activity intolerance will decrease Outcome: Progressing   Problem: Skin Integrity: Goal: Risk for impaired skin integrity will decrease Outcome: Progressing   Problem: Activity: Goal: Ability to return to baseline activity level will improve Outcome: Progressing   "

## 2024-10-30 ENCOUNTER — Other Ambulatory Visit (HOSPITAL_COMMUNITY): Payer: Self-pay

## 2024-10-30 MED ORDER — DEXTROMETHORPHAN POLISTIREX ER 30 MG/5ML PO SUER: 15.0000 mg | Freq: Two times a day (BID) | ORAL | Status: DC | PRN

## 2024-10-30 NOTE — Plan of Care (Signed)

## 2024-10-30 NOTE — Plan of Care (Signed)
  Problem: Clinical Measurements: Goal: Will remain free from infection Outcome: Progressing Goal: Diagnostic test results will improve Outcome: Progressing Goal: Respiratory complications will improve Outcome: Progressing Goal: Cardiovascular complication will be avoided Outcome: Progressing   Problem: Activity: Goal: Risk for activity intolerance will decrease Outcome: Progressing   Problem: Nutrition: Goal: Adequate nutrition will be maintained Outcome: Progressing   Problem: Coping: Goal: Level of anxiety will decrease Outcome: Progressing   Problem: Elimination: Goal: Will not experience complications related to bowel motility Outcome: Progressing Goal: Will not experience complications related to urinary retention Outcome: Progressing   Problem: Pain Managment: Goal: General experience of comfort will improve and/or be controlled Outcome: Progressing   Problem: Safety: Goal: Ability to remain free from injury will improve Outcome: Progressing

## 2024-10-30 NOTE — TOC Progression Note (Signed)
 Transition of Care [facility]) - Progression Note    Patient Details  Name: Barbara [name] MRN: [id number] Date of Birth: 1941-07-17  Transition [facility] Care [facility]) CM/SW Contact  Hartley KATHEE Robertson, [place] Phone Number: 10/30/2024, 4:47 PM  Clinical Narrative:     CSW spoke with pt's son, he states he has not yet spoken with reps from [employer] (third party appeal), states when he spoke with hospital ICM staff, he was told out of pocket costs for pt at [facility] would be around $200 per day, he stated when he spoke with staff at [facility], he was told it would be $415 per day which is not attainable for the family at this time. Pt's son states he has tried to contact [employer] but has not been successful. Pt's son expressed frustration as he feels he is being told conflicting information by different people, worried that when appeal with [employer] is done he will not be at bedside and pt is not oriented. ICM will continue to follow.   Expected Discharge Plan: Skilled Nursing Facility Barriers to Discharge: Other (must enter comment) (SNF Private Pay)               Expected Discharge Plan and Services In-house Referral: Clinical Social Work   Post Acute Care Choice: Skilled Nursing Facility Living arrangements for the past 2 months: Single Family Home                                       Social Drivers of Health (SDOH) Interventions SDOH Screenings   Food Insecurity: No Food Insecurity (10/09/2024)  Housing: High Risk (06/20/2024)  Transportation Needs: Unmet Transportation Needs (06/20/2024)  Utilities: Not At Risk (10/09/2024)  Alcohol Screen: Low Risk (08/05/2022)  Depression (PHQ2-9): Low Risk (04/05/2024)  Financial Resource Strain: Low Risk (08/05/2022)  Physical Activity: Insufficiently Active (07/02/2022)  Social Connections: Moderately Integrated (06/20/2024)  Stress: No Stress Concern Present (07/02/2022)  Tobacco Use: Medium Risk (10/09/2024)    Readmission Risk  Interventions    05/24/2024    8:57 AM 03/11/2024    1:07 PM  Readmission Risk Prevention Plan  Transportation Screening Complete Complete  PCP or Specialist Appt within 5-7 Days  Complete  Home Care Screening  Complete  Medication Review (RN CM)  Complete  HRI or Home Care Consult Complete   Social Work Consult for Recovery Care Planning/Counseling Complete   Palliative Care Screening Not Applicable   Medication Review Oceanographer) Complete

## 2024-10-30 NOTE — Progress Notes (Signed)
 [facility] PROGRESS NOTE  Patient: Barbara [name] FMW:[id number]   PCP: Johnny Garnette LABOR, MD DOB: 1941/01/05   DOA: 10/09/2024   DOS: 10/30/2024    Subjective: No nausea no vomiting no fever no chills.  Denies any acute complaint.  Had some cough last night.  Objective:  Vitals:   10/30/24 0306 10/30/24 0317 10/30/24 0825 10/30/24 1710  BP: 135/84  97/76 (!) 120/57  Pulse: 88     Resp: 14  18 (!) 22  Temp: 98.4 F (36.9 C)  98.6 F (37 C) 98.7 F (37.1 C)  TempSrc: Oral  Oral Oral  SpO2: 96%  97% 96%  Weight:  59.3 kg    Height:       Clear to auscultation.  No wheezing. No edema. Bowel sound present  Assessment and plan: Remains medically stable.  Awaiting placement.  Author: Yetta Blanch, MD [facility] 10/30/2024 6:25 PM   If 7PM-7AM, please contact night-coverage at [email or web address]

## 2024-10-31 ENCOUNTER — Other Ambulatory Visit (HOSPITAL_COMMUNITY): Payer: Self-pay

## 2024-10-31 MED ORDER — FUROSEMIDE 40 MG PO TABS
40.0000 mg | ORAL_TABLET | ORAL | Status: DC
  Administered 2024-11-01 – 2024-11-04 (×2): 40 mg via ORAL

## 2024-10-31 NOTE — TOC Progression Note (Signed)
 Transition of Care [facility]) - Progression Note    Patient Details  Name: Barbara [name] MRN: [id number] Date of Birth: [birth year]  Transition of Care [facility]) CM/SW Contact  Olam FORBES Ally, LCSW Phone Number: 10/31/2024, 3:29 PM  Clinical Narrative:    CSW was alerted if any new information about SNF authorization. CSW followed up with pt's son Sallee and he explained that he has not heard anything back yet. He updated CSW that he is trying to see if the 24 hour caregiver may be an option and should know today. CSW called [employer] to ascertain if they had any new information about the appeal and was informed that there were no notes about the outcome.  TOC team will continue to assist with discharge planning needs.    Expected Discharge Plan: Skilled Nursing Facility Barriers to Discharge: Other (must enter comment) (SNF Private Pay)               Expected Discharge Plan and Services In-house Referral: Clinical Social Work   Post Acute Care Choice: Skilled Nursing Facility Living arrangements for the past 2 months: Single Family Home                                       Social Drivers of Health (SDOH) Interventions SDOH Screenings   Food Insecurity: No Food Insecurity (10/09/2024)  Housing: High Risk (06/20/2024)  Transportation Needs: Unmet Transportation Needs (06/20/2024)  Utilities: Not At Risk (10/09/2024)  Alcohol Screen: Low Risk (08/05/2022)  Depression (PHQ2-9): Low Risk (04/05/2024)  Financial Resource Strain: Low Risk (08/05/2022)  Physical Activity: Insufficiently Active (07/02/2022)  Social Connections: Moderately Integrated (06/20/2024)  Stress: No Stress Concern Present (07/02/2022)  Tobacco Use: Medium Risk (10/09/2024)    Readmission Risk Interventions    05/24/2024    8:57 AM 03/11/2024    1:07 PM  Readmission Risk Prevention Plan  Transportation Screening Complete Complete  PCP or Specialist Appt within 5-7 Days  Complete  Home Care Screening   Complete  Medication Review (RN CM)  Complete  HRI or Home Care Consult Complete   Social Work Consult for Recovery Care Planning/Counseling Complete   Palliative Care Screening Not Applicable   Medication Review Oceanographer) Complete

## 2024-10-31 NOTE — Progress Notes (Signed)
 [facility] PROGRESS NOTE  Patient: Barbara [name] FMW:[id number]   PCP: Johnny Garnette LABOR, MD DOB: 07-06-1941   DOA: 10/09/2024   DOS: 10/31/2024    Subjective: Medically stable.  No nausea or vomiting.  No other acute complaint.  Objective:  Vitals:   10/31/24 0439 10/31/24 0714 10/31/24 1127 10/31/24 1534  BP: 133/67 (!) 138/90 120/89 130/71  Pulse: 98 89  74  Resp: 20 18  17   Temp: 98.9 F (37.2 C) 98.7 F (37.1 C) 97.6 F (36.4 C) 98 F (36.7 C)  TempSrc: Oral Oral Oral Oral  SpO2: 98% 95%  97%  Weight: 59.7 kg     Height:       Clear to auscultation. S1-S2 present No edema.  Assessment and plan: Remains medically stable. Patient will receive Lasix  tomorrow.  Author: Yetta Blanch, MD [facility] 10/31/2024 6:22 PM   If 7PM-7AM, please contact night-coverage at [email or web address]

## 2024-11-01 ENCOUNTER — Other Ambulatory Visit (HOSPITAL_COMMUNITY): Payer: Self-pay

## 2024-11-01 DIAGNOSIS — I200 Unstable angina: Secondary | ICD-10-CM | POA: Diagnosis not present

## 2024-11-01 NOTE — Progress Notes (Signed)
 PT Cancellation Note  Patient Details Name: KIERSTIN JANUARY MRN: [id number] DOB: [birth year]   Cancelled Treatment:    Reason Eval/Treat Not Completed: Other (comment) (pt with stomach upset after lunch. Will check back as schedule allows or see tomorrow.) Pt was able to ambulate with mobility tech before lunch today.   Richerd Lipoma, PT  Acute Rehab Services Secure chat preferred Office [phone]    Richerd CROME Tristian Sickinger 11/01/2024, 1:12 PM

## 2024-11-01 NOTE — Progress Notes (Signed)
 [facility] PROGRESS NOTE  Patient: Barbara [name] FMW:[id number]   PCP: Johnny Garnette LABOR, MD DOB: 25-Oct-1941   DOA: 10/09/2024   DOS: 11/01/2024    Subjective: Reported some shortness of breath earlier in the day.  No chest pain.  No abdominal pain.  No cough.  Objective:  Vitals:   11/01/24 0415 11/01/24 0707 11/01/24 0800 11/01/24 0942  BP: (!) 146/81  (!) 136/94 (!) 144/87  Pulse: 78  80 70  Resp: 18  17   Temp: 98.3 F (36.8 C)  98.7 F (37.1 C)   TempSrc: Oral  Oral   SpO2: 96%  96%   Weight:  60.6 kg    Height:       Faint basal crackles per S1-S2 present present bowel sound present No edema Ambulated with mobility tech at 150. Without any shortness of breath or dizziness.  Assessment and plan: I provided incentive spirometry for the patient for her complaint of shortness of breath.  Otherwise remains medically stable.  Awaiting placement.  Author: Yetta Blanch, MD [facility] 11/01/2024 7:38 PM   If 7PM-7AM, please contact night-coverage at [email or web address]

## 2024-11-01 NOTE — Plan of Care (Signed)
" °  Problem: Education: Goal: Knowledge of General Education information will improve Description: Including pain rating scale, medication(s)/side effects and non-pharmacologic comfort measures Outcome: Progressing   Problem: Health Behavior/Discharge Planning: Goal: Ability to manage health-related needs will improve Outcome: Progressing   Problem: Clinical Measurements: Goal: Ability to maintain clinical measurements within normal limits will improve Outcome: Progressing Goal: Will remain free from infection Outcome: Progressing Goal: Cardiovascular complication will be avoided Outcome: Progressing   Problem: Activity: Goal: Risk for activity intolerance will decrease Outcome: Progressing   Problem: Nutrition: Goal: Adequate nutrition will be maintained Outcome: Progressing   Problem: Skin Integrity: Goal: Risk for impaired skin integrity will decrease Outcome: Progressing   Problem: Activity: Goal: Ability to return to baseline activity level will improve Outcome: Progressing   "

## 2024-11-01 NOTE — Progress Notes (Signed)
 Mobility Specialist Progress Note:    11/01/24 1000  Mobility  Activity Ambulated with assistance  Level of Assistance Minimal assist, patient does 75% or more  Assistive Device Front wheel walker  Distance Ambulated (ft) 150 ft  Activity Response Tolerated well  Mobility Referral Yes  Mobility visit 1 Mobility  Mobility Specialist Start Time (ACUTE ONLY) 1000  Mobility Specialist Stop Time (ACUTE ONLY) 1022  Mobility Specialist Time Calculation (min) (ACUTE ONLY) 22 min   Received pt in bed having no complaints and agreeable to mobility. MinA for STS, contact guard during ambulation. Pt was asymptomatic throughout ambulation and returned to room w/o fault. Left in bed w/ call bell in reach and all needs met.   Thersia Minder Mobility Specialist  Please contact vis Secure Chat or  Rehab Office [phone]

## 2024-11-01 NOTE — Progress Notes (Signed)
 Nutrition Follow-up  DOCUMENTATION CODES:   Not applicable  INTERVENTION:   -Continue regular diet -Continue MVI with minerals daily -Continue 30 ml Prosource Plus BID, each supplement provides 100 kcals and 15 grams protein   NUTRITION DIAGNOSIS:   Increased nutrient needs related to chronic illness (CHF, wound healing) as evidenced by estimated needs.  Ongoing  GOAL:   Patient will meet greater than or equal to 90% of their needs  Progressing   MONITOR:   PO intake, Supplement acceptance  REASON FOR ASSESSMENT:   Consult Assessment of nutrition requirement/status  ASSESSMENT:   83 y.o. female with medical history significant of  CAD, a.fib on eliquis , HTN, HLD, GERD, CKD3a, SSS sp pacemaker admitted for NSTEMI , hypokalemia, and FTT.  Reviewed I/O's: +280 ml x 24 hours and -6.5 L since 10/18/24  UOP: 200 ml x 24 hours  Patient unavailable at time of visit. Attempted to speak with patient via call to hospital room phone, however, unable to reach. RD unable to obtain further nutrition-related history or complete nutrition-focused physical exam at this time.    Case discussed with RN, who reports patient with good appetite. She consumed all of her breakfast this morning and also consumed her Prosource. Documented meal completions 50-100%. Breakfast consisted of scrambled eggs, cranberry juice, sausage patty, biscuit, and grits (976 kcals, 21 grams protein).   Reviewed weights; weight has ranged from 57.7-61.2 kg over the past 7 days. Weights have ranged between 57-63.7 kg throughout admission (-6.5 L since admission). Suspect weight fluctuations secondary to history of CHF.   Per MD notes, patient has been medically stable for discharge since 10/27/24. Confirmed with RN that patient is awaiting placement. Per TOC notes, plan for SNF vs home with 24 hour care.   Medications reviewed and include jardiance , lasix , protonix , and senokot.   Labs reviewed: K: 3.2, calcium :  10.2 (WDL). Calcium  (ionized): 6.5 on 06/20/24.  Diet Order:   Diet Order             Diet regular Fluid consistency: Thin  Diet effective now                   EDUCATION NEEDS:   Education needs have been addressed  Skin:  Skin Assessment: Skin Integrity Issues: Skin Integrity Issues:: DTI DTI: sacrum  Last BM:  10/30/24  Height:   Ht Readings from Last 1 Encounters:  10/09/24 5' 8 (1.727 m)    Weight:   Wt Readings from Last 1 Encounters:  11/01/24 60.6 kg    Ideal Body Weight:  63.6 kg  BMI:  Body mass index is 20.31 kg/m.  Estimated Nutritional Needs:   Kcal:  1650-1850  Protein:  80-95 grams  Fluid:  1.6-1.8 L    Margery ORN, RD, LDN, CDCES Registered Dietitian III Certified Diabetes Care and Education Specialist If unable to reach this RD, please use RD Inpatient group chat on secure chat between hours of 8am-4 pm daily

## 2024-11-02 NOTE — Progress Notes (Signed)
 [facility] Progress Note Patient: Barbara [name] FMW:[id number] DOB: 1941-04-20  DOA: 10/09/2024 DOS: the patient was seen and examined on 11/02/2024  Brief Hospital Course: 83 year old female with history of CAD, PAF on Eliquis , HTN, HLD, CKD 3a, sick sinus syndrome status post pacemaker who comes into the hospital with chest pain.   Cardiology was consulted on admission, underwent left heart cardiac cath with patent proximal LAD stent with minimal restenosis, mild nonobstructive disease in the mid circumflex and mild nonobstructive disease in the mid RCA, recommending medical management. PT OT was consulted, SNF was recommended. Currently remaining medically stable.  Assesement and Plan: Chest pain, noncardiac. CAD  Elevated troponin. Cardiology consulted. She underwent left heart cardiac Cardiac Catheterization 12/8. Found patent proximal LAD stent with minimal restenosis.  Mild nonobstructive CAD and LCx, RCA as well as left main. Medical management recommended. Currently on Eliquis , Lopressor , statin, Imdur .   Acute on chronic diastolic CHF  Echocardiogram showed EF of 60 to 65%, apical hypertrophic cardiomyopathy, grade 2 diastolic dysfunction. Diuresis with Lasix . Continue Jardiance , continue Lasix  as needed, currently on Lasix  twice weekly Monday and Thursday.   CKD 3B baseline creatinine over 2025 appears to range between 1.2 and 1.6. Current serum creatinine remaining at baseline.   PAF with tachybradycardia syndrome. PPM implant 7/23. Continue amiodarone , metoprolol , she is anticoagulated with Eliquis .     Sick sinus syndrome status post pacemaker implant. Was on telemetry.  No events.  Currently on MedSurg.   Hypophosphatemia-CKD continue to monitor and replenish as indicated     Disposition -PT OT recommended SNF. She lives independently. Family attending to ask help 24/7 care for the patient. SNF has been denied by the insurance. It appears that  the appeal is also denied and third-party appeal is also either denied or still pending review. Patient initially was planned to go to SNF regardless with 7000 dollars out-of-pocket pay. Currently child psychotherapist following. Patient remains medically stable.  Subjective: Denies any acute complaint.  No nausea no vomiting no fever no chills.  Seen by physical therapy today.  Physical Exam: Basal crackles. No edema. Bowel sounds  Data Reviewed: I have Reviewed nursing notes, Vitals, and Lab results. Disposition: Status is: Inpatient Remains inpatient appropriate because: Monitor for arrangement of placement.  apixaban  (ELIQUIS ) tablet 2.5 mg Start: 10/12/24 0800 apixaban  (ELIQUIS ) tablet 2.5 mg   Family Communication: No one at bedside Level of care: Med-Surg   Vitals:   11/01/24 0942 11/01/24 1948 11/02/24 0343 11/02/24 0800  BP: (!) 144/87 (!) 109/55 114/74 116/65  Pulse: 70 60 72 92  Resp:  18 20 18   Temp:  97.8 F (36.6 C) 98.9 F (37.2 C) 98.4 F (36.9 C)  TempSrc:  Oral Oral Oral  SpO2:   96% 96%  Weight:   61.3 kg   Height:         Author: Yetta Blanch, MD 11/02/2024 5:47 PM  Please look on [email or web address] to find out who is on call.

## 2024-11-02 NOTE — TOC Progression Note (Signed)
 Transition of Care [facility]) - Progression Note    Patient Details  Name: Barbara [name] MRN: [id number] Date of Birth: [birth year]  [facility] CM/SW Contact  Lauraine FORBES Saa, LCSWA Phone Number: 11/02/2024, 3:02 PM  Clinical Narrative:     3:02 PM CSW followed up with [facility] SNF on patient admitting with private pay. [facility] stated that they last spoke with son 10/18/24 and provided cost estimates then. CSW requested SNF follow up with patient's son again. SNF stated that per patient's son, patient's insurance company informed him that the providers were not documenting something needed for insurance to approve SNF and that he is waiting for a call back from [employer]. CSW relayed information to [facility] and [facility] Leadership for guidance. TOC Leadership stated that if patient is medically ready for discharge and SNF appeal was denied, then patient should discharge. RNCM was made aware. TOC will continue to follow.  Expected Discharge Plan: Skilled Nursing Facility Barriers to Discharge: Other (must enter comment) (SNF Private Pay)               Expected Discharge Plan and Services In-house Referral: Clinical Social Work   Post Acute Care Choice: Skilled Nursing Facility Living arrangements for the past 2 months: Single Family Home                                       Social Drivers of Health (SDOH) Interventions SDOH Screenings   Food Insecurity: No Food Insecurity (10/09/2024)  Housing: High Risk (06/20/2024)  Transportation Needs: Unmet Transportation Needs (06/20/2024)  Utilities: Not At Risk (10/09/2024)  Alcohol Screen: Low Risk (08/05/2022)  Depression (PHQ2-9): Low Risk (04/05/2024)  Financial Resource Strain: Low Risk (08/05/2022)  Physical Activity: Insufficiently Active (07/02/2022)  Social Connections: Moderately Integrated (06/20/2024)  Stress: No Stress Concern Present (07/02/2022)  Tobacco Use: Medium Risk (10/09/2024)     Readmission Risk Interventions    05/24/2024    8:57 AM 03/11/2024    1:07 PM  Readmission Risk Prevention Plan  Transportation Screening Complete Complete  PCP or Specialist Appt within 5-7 Days  Complete  Home Care Screening  Complete  Medication Review (RN CM)  Complete  HRI or Home Care Consult Complete   Social Work Consult for Recovery Care Planning/Counseling Complete   Palliative Care Screening Not Applicable   Medication Review Ocean[employer]) Complete

## 2024-11-02 NOTE — Plan of Care (Signed)
" °  Problem: Education: Goal: Knowledge of General Education information will improve Description: Including pain rating scale, medication(s)/side effects and non-pharmacologic comfort measures Outcome: Progressing   Problem: Health Behavior/Discharge Planning: Goal: Ability to manage health-related needs will improve Outcome: Progressing   Problem: Clinical Measurements: Goal: Ability to maintain clinical measurements within normal limits will improve Outcome: Progressing Goal: Respiratory complications will improve Outcome: Progressing Goal: Cardiovascular complication will be avoided Outcome: Progressing   Problem: Activity: Goal: Risk for activity intolerance will decrease Outcome: Progressing   Problem: Nutrition: Goal: Adequate nutrition will be maintained Outcome: Progressing   Problem: Safety: Goal: Ability to remain free from injury will improve Outcome: Progressing   Problem: Skin Integrity: Goal: Risk for impaired skin integrity will decrease Outcome: Progressing   Problem: Activity: Goal: Ability to return to baseline activity level will improve Outcome: Progressing   "

## 2024-11-02 NOTE — Progress Notes (Signed)
 Physical Therapy Treatment Patient Details Name: Barbara [name] MRN: [id number] DOB: 11-Nov-1940 Today's Date: 11/02/2024   History of Present Illness 83 yo female adm 12/6 with SOB and chest pain. Admitted for NSTEMI and acute CHF exacerbation. PMH includes: CAD, HFpEF, A-fib s/p PPM, essential HTN, HLD, anxiety, CKD, anemia, Rt TSA, vertigo, Lt femur fx    PT Comments  The pt was agreeable to session with focus on progressing activity tolerance and LE strength to complete transfers without assistance. Pt continues to demos significant LE weakness needing minA to power up to standing and only tolerating 3-5 reps of LE strengthening exercises against min resistance at a time. The pt is motivated to progress independence, but continues to need more support than is available at home, recommendations for post-acute rehab remain appropriate to progress functional independence with transfers and mobility.     If plan is discharge home, recommend the following: A little help with walking and/or transfers;A little help with bathing/dressing/bathroom;Assistance with cooking/housework;Assist for transportation;Help with stairs or ramp for entrance   Can travel by private vehicle        Equipment Recommendations  None recommended by PT    Recommendations for Other Services       Precautions / Restrictions Precautions Precautions: Fall Recall of Precautions/Restrictions: Intact Restrictions Weight Bearing Restrictions Per Provider Order: No     Mobility  Bed Mobility Overal bed mobility: Needs Assistance Bed Mobility: Supine to Sit     Supine to sit: Min assist, HOB elevated, Used rails     General bed mobility comments: minA to complete trunk elevation from elevated bed today, needing increased time    Transfers Overall transfer level: Needs assistance Equipment used: Rolling walker (2 wheels) Transfers: Sit to/from Stand Sit to Stand: Min assist           General  transfer comment: minA to rise from EOB and recliner, completed x3 from EOB and x5 from recliner at end of session    Ambulation/Gait Ambulation/Gait assistance: Min assist Gait Distance (Feet): 150 Feet Assistive device: Rolling walker (2 wheels) Gait Pattern/deviations: Step-to pattern, Decreased step length - right, Decreased step length - left, Trunk flexed, Narrow base of support, Shuffle Gait velocity: 0.18 m/s Gait velocity interpretation: <1.31 ft/sec, indicative of household ambulator (0.61 ft/sec)   General Gait Details: pt with small shuffling steps with L knee knocking R knee during stance. pt reports this is due to arthritis. progressively decreased foot clearance with fatigue     Balance Overall balance assessment: Needs assistance Sitting-balance support: No upper extremity supported, Feet supported Sitting balance-Leahy Scale: Fair Sitting balance - Comments: intermittent posterior lean with reaching   Standing balance support: Bilateral upper extremity supported, During functional activity Standing balance-Leahy Scale: Poor Standing balance comment: Pt needs BUE support on RW or stable surface.                            Communication Communication Communication: No apparent difficulties  Cognition Arousal: Alert Behavior During Therapy: WFL for tasks assessed/performed   PT - Cognitive impairments: No family/caregiver present to determine baseline, Initiation, Sequencing, Problem solving, Safety/Judgement, Memory                       PT - Cognition Comments: pt with limited memory of therapist and prior sessions. able to follow simple comands in session, not formally assessed Following commands: Impaired Following commands impaired: Follows one step commands  with increased time    Cueing Cueing Techniques: Verbal cues, Gestural cues  Exercises General Exercises - Lower Extremity Long Arc Quad: Strengthening, Both, 5 reps (2 x 5 against  minimal manual resistance) Heel Slides: Strengthening, Both, 5 reps (2 x 5 against minimal manual resistance) Hip Flexion/Marching: Strengthening, Both, 10 reps (2x10) Other Exercises Other Exercises: sit-stand x3 from EOB then x5 from recliner    General Comments General comments (skin integrity, edema, etc.): VSS on RA      Pertinent Vitals/Pain Pain Assessment Pain Assessment: No/denies pain           PT Goals (current goals can now be found in the care plan section) Acute Rehab PT Goals Patient Stated Goal: to improve strength and go home Progress towards PT goals: Progressing toward goals    Frequency    Min 2X/week       AM-PAC PT 6 Clicks Mobility   Outcome Measure  Help needed turning from your back to your side while in a flat bed without using bedrails?: A Little Help needed moving from lying on your back to sitting on the side of a flat bed without using bedrails?: A Little Help needed moving to and from a bed to a chair (including a wheelchair)?: A Little Help needed standing up from a chair using your arms (e.g., wheelchair or bedside chair)?: A Lot Help needed to walk in hospital room?: A Lot Help needed climbing 3-5 steps with a railing? : A Lot 6 Click Score: 15    End of Session Equipment Utilized During Treatment: Gait belt Activity Tolerance: Patient tolerated treatment well Patient left: with call bell/phone within reach;in chair;with chair alarm set Nurse Communication: Mobility status PT Visit Diagnosis: Unsteadiness on feet (R26.81);Other abnormalities of gait and mobility (R26.89);Repeated falls (R29.6);Muscle weakness (generalized) (M62.81)     Time: 8577-8554 PT Time Calculation (min) (ACUTE ONLY): 23 min  Charges:    $Therapeutic Exercise: 23-37 mins PT General Charges $$ ACUTE PT VISIT: 1 Visit                     Izetta Call, PT, DPT   Acute Rehabilitation Department Office [phone] Secure Chat Communication  Preferred   Izetta JULIANNA Call 11/02/2024, 3:19 PM

## 2024-11-03 ENCOUNTER — Other Ambulatory Visit (HOSPITAL_COMMUNITY): Payer: Self-pay

## 2024-11-03 DIAGNOSIS — K219 Gastro-esophageal reflux disease without esophagitis: Secondary | ICD-10-CM | POA: Diagnosis not present

## 2024-11-03 DIAGNOSIS — E876 Hypokalemia: Secondary | ICD-10-CM | POA: Diagnosis not present

## 2024-11-03 DIAGNOSIS — I200 Unstable angina: Secondary | ICD-10-CM | POA: Diagnosis not present

## 2024-11-03 LAB — RENAL FUNCTION PANEL
Albumin: 3.3 g/dL — ABNORMAL LOW (ref 3.5–5.0)
Anion gap: 9 (ref 5–15)
BUN: 26 mg/dL — ABNORMAL HIGH (ref 8–23)
CO2: 25 mmol/L (ref 22–32)
Calcium: 10.2 mg/dL (ref 8.9–10.3)
Chloride: 106 mmol/L (ref 98–111)
Creatinine, Ser: 1.34 mg/dL — ABNORMAL HIGH (ref 0.44–1.00)
GFR, Estimated: 39 mL/min — ABNORMAL LOW
Glucose, Bld: 81 mg/dL (ref 70–99)
Phosphorus: 2.9 mg/dL (ref 2.5–4.6)
Potassium: 4.2 mmol/L (ref 3.5–5.1)
Sodium: 140 mmol/L (ref 135–145)

## 2024-11-03 MED ORDER — FUROSEMIDE 40 MG PO TABS: 40.0000 mg | ORAL_TABLET | ORAL | Status: DC

## 2024-11-03 NOTE — Progress Notes (Signed)
 Physical Therapy Treatment Patient Details Name: Barbara [name] MRN: [id number] DOB: 1940-12-10 Today's Date: 11/03/2024   History of Present Illness 83 yo female adm 12/6 with SOB and chest pain. Admitted for NSTEMI and acute CHF exacerbation. PMH includes: CAD, HFpEF, A-fib s/p PPM, essential HTN, HLD, anxiety, CKD, anemia, Rt TSA, vertigo, Lt femur fx    PT Comments  The pt was agreeable to session with focus on LE strengthening exercises and independence with transfers. Pt reports needing to walk ~20 ft to bathroom from her lift chair at home, so pt completed a series of sit-stand transfers followed by 25 ft ambulation to chair where she performed standing balance tasks to mimic reaching of LB dressing x 4 in session. Pt tolerated well, continues to need increased time, intermittent verbal cues, and up to minA when standing from low surface. Pt also with decreased clearance with fatigue, would benefit from supervision for all OOB mobility for maximal safety and continued inpatient therapies <3hours/day prior to return home alone. Seeing as pt's insurance has denied this rehab, would benefit from increased aide time for increased supervision with activity and physical assist (as pt not consistently able to stand without physical assistance) for transfers and bed mobility. Will continue efforts acutely.    If plan is discharge home, recommend the following: A little help with walking and/or transfers;A little help with bathing/dressing/bathroom;Assistance with cooking/housework;Assist for transportation;Help with stairs or ramp for entrance   Can travel by private vehicle        Equipment Recommendations  None recommended by PT    Recommendations for Other Services       Precautions / Restrictions Precautions Precautions: Fall Recall of Precautions/Restrictions: Intact Restrictions Weight Bearing Restrictions Per Provider Order: No     Mobility  Bed Mobility Overal bed  mobility: Needs Assistance Bed Mobility: Supine to Sit, Sit to Supine     Supine to sit: HOB elevated, Used rails, Contact guard Sit to supine: Min assist   General bed mobility comments: pt able to complete trunk elevation without assist today, continues to need increased time. continues to need cues for technique and minA to bring LE into bed    Transfers Overall transfer level: Needs assistance Equipment used: Rolling walker (2 wheels) Transfers: Sit to/from Stand Sit to Stand: Min assist           General transfer comment: minA to CGA to rise from EOB, completed 3 sets of 5 from progressively lower heights with minA only as pt fatigued at lowest height    Ambulation/Gait Ambulation/Gait assistance: Min assist Gait Distance (Feet): 25 Feet (+ 25 ft) Assistive device: Rolling walker (2 wheels) Gait Pattern/deviations: Step-to pattern, Decreased step length - right, Decreased step length - left, Trunk flexed, Narrow base of support, Shuffle Gait velocity: 0.18 m/s Gait velocity interpretation: <1.31 ft/sec, indicative of household ambulator   General Gait Details: short, shuffling gait with shorter distance to mimic home environment. cues for RW positioning      Balance Overall balance assessment: Needs assistance Sitting-balance support: No upper extremity supported, Feet supported Sitting balance-Leahy Scale: Fair Sitting balance - Comments: intermittent posterior lean with reaching   Standing balance support: Bilateral upper extremity supported, During functional activity Standing balance-Leahy Scale: Fair Standing balance comment: able to release RW and reach for legs to mimic lower body dressing, dependent on BUE support with gait  Communication Communication Communication: No apparent difficulties  Cognition Arousal: Alert Behavior During Therapy: WFL for tasks assessed/performed   PT - Cognitive impairments: No  family/caregiver present to determine baseline, Problem solving, Memory, Safety/Judgement                       PT - Cognition Comments: pt able to follow commands in session, demos poor insight to need for assist and limited problem solving for maintaining safety if returning home Following commands: Impaired Following commands impaired: Follows one step commands with increased time    Cueing Cueing Techniques: Verbal cues, Gestural cues  Exercises Other Exercises Other Exercises: 3 sets of 5 from progressively lower heights with minA only as pt fatigued at lowest height` Other Exercises: sit-stand + 25 ft ambulation + reaching for knees + sit-stand from chair in hallway to mimix bathroom trips at home, completed x4 in session    General Comments General comments (skin integrity, edema, etc.): VSS on RA      Pertinent Vitals/Pain Pain Assessment Pain Assessment: No/denies pain     PT Goals (current goals can now be found in the care plan section) Acute Rehab PT Goals Patient Stated Goal: to improve strength and go home Progress towards PT goals: Progressing toward goals    Frequency    Min 2X/week       AM-PAC PT 6 Clicks Mobility   Outcome Measure  Help needed turning from your back to your side while in a flat bed without using bedrails?: A Little Help needed moving from lying on your back to sitting on the side of a flat bed without using bedrails?: A Little Help needed moving to and from a bed to a chair (including a wheelchair)?: A Little Help needed standing up from a chair using your arms (e.g., wheelchair or bedside chair)?: A Little Help needed to walk in hospital room?: A Little Help needed climbing 3-5 steps with a railing? : A Lot 6 Click Score: 17    End of Session Equipment Utilized During Treatment: Gait belt Activity Tolerance: Patient tolerated treatment well Patient left: with call bell/phone within reach;in chair;with chair alarm  set Nurse Communication: Mobility status PT Visit Diagnosis: Unsteadiness on feet (R26.81);Other abnormalities of gait and mobility (R26.89);Repeated falls (R29.6);Muscle weakness (generalized) (M62.81)     Time: 8657-8583 PT Time Calculation (min) (ACUTE ONLY): 34 min  Charges:    $Therapeutic Exercise: 23-37 mins PT General Charges $$ ACUTE PT VISIT: 1 Visit                     Izetta Call, PT, DPT   Acute Rehabilitation Department Office [phone] Secure Chat Communication Preferred   Izetta JULIANNA Call 11/03/2024, 3:38 PM

## 2024-11-03 NOTE — Discharge Summary (Signed)
 " [facility]  Physician Discharge Summary   Patient ID: Barbara [name] MRN: [id number] DOB/AGE: 02-14-1941 83 y.o.  Admit date: 10/09/2024 Discharge date:   11/03/2024   PCP: Johnny Garnette LABOR, MD  DISCHARGE DIAGNOSES:    Unstable angina [facility])   NSTEMI (non-ST elevated myocardial infarction) (HCC)   Normocytic anemia   CAD (coronary artery disease/elevated troponin   PAF (paroxysmal atrial fibrillation) (HCC)   Primary hypertension   Chronic kidney disease, stage 3b (HCC)   Hyperlipidemia   Gastroesophageal reflux disease   Tachycardia-bradycardia syndrome (HCC)   Acute on chronic diastolic heart failure (HCC)   Hypokalemia   Moderate protein malnutrition   RECOMMENDATIONS FOR OUTPATIENT FOLLOW UP: CBC and basic metabolic panel in 4 to 5 days    Home Health: Home with home health Equipment/Devices: None  CODE STATUS: Full code  DISCHARGE CONDITION: fair  Diet recommendation: Regular as tolerated  INITIAL HISTORY: 83 year old female with history of CAD, PAF on Eliquis , HTN, HLD, CKD 3a, sick sinus syndrome status post pacemaker who comes into the hospital with chest pain.   Cardiology was consulted on admission, underwent left heart cardiac cath with patent proximal LAD stent with minimal restenosis, mild nonobstructive disease in the mid circumflex and mild nonobstructive disease in the mid RCA, recommending medical management. PT OT was consulted, SNF was recommended.  SNF denied by her insurance. Currently remaining medically stable.   HOSPITAL COURSE:   Chest pain, noncardiac. CAD  Elevated troponin. Cardiology consulted. She underwent left heart cardiac Cardiac Catheterization 12/8. Found patent proximal LAD stent with minimal restenosis.  Mild nonobstructive CAD and LCx, RCA as well as left main. Medical management recommended. Currently on Eliquis , Lopressor , statin, Imdur .   Acute on chronic diastolic CHF  Echocardiogram showed EF of 60  to 65%, apical hypertrophic cardiomyopathy, grade 2 diastolic dysfunction. Diuresis with Lasix . Continue Jardiance , continue Lasix  as needed, currently on Lasix  twice weekly Monday and Thursday.   CKD 3B baseline creatinine over 2025 appears to range between 1.2 and 1.6. Current serum creatinine remaining at baseline.   PAF with tachybradycardia syndrome. PPM implant 7/23. Continue amiodarone , metoprolol , she is anticoagulated with Eliquis .     Sick sinus syndrome status post pacemaker implant. Stable   Hypophosphatemia-CKD   Patient was seen by physical therapy.  SNF is recommended for rehab.  However insurance denied SNF.  Patient's mobility has improved in the last few days.  Was seen by PT on 12/30 and was able to ambulate 150 feet using a rolling walker.  Patient is medically stable for discharge.    PERTINENT LABS:  The results of significant diagnostics from this hospitalization (including imaging, microbiology, ancillary and laboratory) are listed below for reference.     Labs:   Basic Metabolic Panel: Recent Labs  Lab 10/28/24 0231 11/03/24 0229  NA 141 140  K 3.2* 4.2  CL 103 106  CO2 27 25  GLUCOSE 90 81  BUN 31* 26*  CREATININE 1.50* 1.34*  CALCIUM  10.2 10.2  PHOS 2.8 2.9   Liver Function Tests: Recent Labs  Lab 10/28/24 0231 11/03/24 0229  ALBUMIN 3.4* 3.3*    BNP: BNP (last 3 results) Recent Labs    05/18/24 1132 06/20/24 1538 10/13/24 0404  BNP 852.3* 980.3* 960.0*    IMAGING STUDIES DG CHEST PORT 1 VIEW Result Date: 10/13/2024 EXAM: 1 VIEW(S) XRAY OF THE CHEST 10/13/2024 02:03:44 AM COMPARISON: 10/09/2024 CLINICAL HISTORY: Rales. FINDINGS: LINES, TUBES AND DEVICES: Left subclavian pacemaker. LUNGS AND PLEURA: Mild  patchy right upper and left lower lobe opacities, suspicious for pneumonia. No pleural effusion. No pneumothorax. HEART AND MEDIASTINUM: Left subclavian pacemaker. No acute abnormality of the cardiac and mediastinal  silhouettes. BONES AND SOFT TISSUES: Right shoulder arthroplasty. No acute osseous abnormality. IMPRESSION: 1. Mild patchy right upper and left lower lobe opacities, suspicious for pneumonia. Electronically signed by: Pinkie Pebbles MD 10/13/2024 02:07 AM EST RP Workstation: [id number]   CARDIAC CATHETERIZATION Result Date: 10/11/2024   Dist LM lesion is 15% stenosed.   Prox Cx lesion is 40% stenosed.   Prox RCA to Mid RCA lesion is 30% stenosed.   Ost LAD to Prox LAD lesion is 30% stenosed. Patent proximal LAD stent with minimal restenosis Mild non-obstructive disease in the mid Circumflex at the ostium of the obtuse marginal branch Mild non-obstructive disease in the mid RCA LVEDP 8 mmHg Recommendations: Continue medical management of CAD   ECHOCARDIOGRAM COMPLETE Result Date: 10/10/2024    ECHOCARDIOGRAM REPORT   Patient Name:   Barbara [name] Date of Exam: 10/10/2024 Medical Rec #:  [id number]              Height:       68.0 in Accession #:    [id number]             Weight:       132.5 lb Date of Birth:  1941-06-18              BSA:          1.716 m Patient Age:    83 years               BP:           132/48 mmHg Patient Gender: F                      HR:           60 bpm. Exam Location:  Inpatient Procedure: 2D Echo, Color Doppler, Cardiac Doppler and Intracardiac            Opacification Agent (Both Spectral and Color Flow Doppler were            utilized during procedure). Indications:    CHF Acute Diastolic I50.31  History:        Patient has prior history of Echocardiogram examinations, most                 recent 05/18/2024.  Sonographer:    Tinnie Gosling RDCS Referring Phys: ROLLO JONELLE LOUDER IMPRESSIONS  1. Small spade like ventricular cavity. 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 Left ventricular ejection fraction, by estimation, is 60 to 65%. The left ventricle has  normal function. The left ventricle has no regional wall motion abnormalities. There is mild left ventricular hypertrophy. Left ventricular diastolic parameters are consistent with Grade II diastolic dysfunction (pseudonormalization). Elevated left ventricular end-diastolic pressure.  2. Device leads in RA/RV. Right ventricular systolic function is normal. The right ventricular size is normal.  3. Left atrial size was moderately dilated.  4. The mitral valve is abnormal. Mild mitral valve regurgitation. No evidence of mitral stenosis.  5. Calcified non coronary cusp. The aortic valve is tricuspid. There is moderate calcification of the aortic valve. There is moderate thickening of the aortic valve. Aortic valve regurgitation is not visualized. Aortic valve sclerosis is present, with no evidence of aortic valve  stenosis.  6. The inferior vena cava is normal in size with greater than 50% respiratory variability, suggesting right atrial pressure of 3 mmHg. FINDINGS  Left Ventricle: Small spade like ventricular cavity. 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 Left ventricular ejection fraction, by  estimation, is 60 to 65%. The left ventricle has normal function. The left ventricle has no regional wall motion abnormalities. Strain was performed and the global longitudinal strain is indeterminate. The left ventricular internal cavity size was normal in size. There is mild left ventricular hypertrophy. Left ventricular diastolic parameters are consistent with Grade II diastolic dysfunction (pseudonormalization). Elevated left ventricular end-diastolic pressure. Right Ventricle: Device leads in RA/RV. The right ventricular size is normal. No increase in right ventricular wall thickness. Right ventricular systolic function is normal. Left Atrium: Left atrial size was moderately dilated. Right Atrium:  Right atrial size was normal in size. Pericardium: There is no evidence of pericardial effusion. Mitral Valve: The mitral valve is abnormal. There is mild thickening of the mitral valve leaflet(s). There is mild calcification of the mitral valve leaflet(s). Mild mitral valve regurgitation. No evidence of mitral valve stenosis. Tricuspid Valve: The tricuspid valve is normal in structure. Tricuspid valve regurgitation is mild . No evidence of tricuspid stenosis. Aortic Valve: Calcified non coronary cusp. The aortic valve is tricuspid. There is moderate calcification of the aortic valve. There is moderate thickening of the aortic valve. Aortic valve regurgitation is not visualized. Aortic valve sclerosis is present, with no evidence of aortic valve stenosis. Pulmonic Valve: The pulmonic valve was normal in structure. Pulmonic valve regurgitation is not visualized. No evidence of pulmonic stenosis. Aorta: The aortic root is normal in size and structure. Venous: The inferior vena cava is normal in size with greater than 50% respiratory variability, suggesting right atrial pressure of 3 mmHg. IAS/Shunts: No atrial level shunt detected by color flow Doppler. Additional Comments: 3D was performed not requiring image post processing on an independent workstation and was indeterminate.  LEFT VENTRICLE PLAX 2D LVIDd:         4.10 cm   Diastology LVIDs:         2.70 cm   LV e' medial:    4.68 cm/s LV PW:         1.20 cm   LV E/e' medial:  18.4 LV IVS:        1.20 cm   LV e' lateral:   6.42 cm/s LVOT diam:     1.60 cm   LV E/e' lateral: 13.4 LV SV:         50 LV SV Index:   29 LVOT Area:     2.01 cm LV IVRT:       113 msec  RIGHT VENTRICLE             IVC RV S prime:     12.40 cm/s  IVC diam: 1.10 cm LEFT ATRIUM             Index        RIGHT ATRIUM           Index LA diam:        4.30 cm 2.51 cm/m   RA Area:     13.10 cm LA Vol (A2C):   75.1 ml 43.77 ml/m  RA Volume:   31.80 ml  18.54 ml/m LA Vol (A4C):   86.3 ml 50.30  ml/m LA Biplane  Vol: 80.4 ml 46.86 ml/m  AORTIC VALVE LVOT Vmax:   138.00 cm/s LVOT Vmean:  94.500 cm/s LVOT VTI:    0.249 m  AORTA Ao Root diam: 2.30 cm Ao Asc diam:  2.80 cm MITRAL VALVE MV Area (PHT): 2.46 cm    SHUNTS MV Decel Time: 308 msec    Systemic VTI:  0.25 m MV E velocity: 85.90 cm/s  Systemic Diam: 1.60 cm MV A velocity: 44.50 cm/s MV E/A ratio:  1.93 Maude Emmer MD Electronically signed by Maude Emmer MD Signature Date/Time: 10/10/2024/3:54:14 PM    Final    DG Lumbar Spine Complete Result Date: 10/09/2024 CLINICAL DATA:  Low back pain following fall. EXAM: LUMBAR SPINE - COMPLETE 4+ VIEW COMPARISON:  None Available. FINDINGS: There is no evidence of acute lumbar spine fracture. Alignment is normal. Multilevel intervertebral disc space narrowing, degenerative endplate changes, and facet arthropathy are noted. There is atherosclerotic calcification of the aorta. Total hip arthroplasty changes are noted on the left. 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 Electronically Signed   By: Leita Birmingham M.D.   On: 10/09/2024 16:53   DG Chest 2 View Result Date: 10/09/2024 CLINICAL DATA:  Low back pain after fall.  Chest pain. EXAM: CHEST - 2 VIEW, LUMBAR SPINE COMPARISON:  06/20/2024, 07/03/2024. FINDINGS: Chest: The heart is enlarged and the mediastinal contour is within normal limits. A dual lead pacemaker is present over the left chest. There is eventration of the posterior left diaphragm with a associated atelectasis. No consolidation, effusion, or pneumothorax is seen. Degenerative changes are present in the thoracic spine. Shoulder arthroplasty changes are noted on the right. Lumbar spine: There is no evidence of acute fracture in the lumbar spine. Alignment appears normal. Multilevel intervertebral disc space narrowing, degenerative endplate changes, and facet arthropathy are noted. There is atherosclerotic calcification of the aorta.  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 IMPRESSION: 1. No active cardiopulmonary disease. 2. No acute fracture or subluxation in the lumbar spine. Electronically Signed   By: Leita Birmingham M.D.   On: 10/09/2024 15:18    DISCHARGE EXAMINATION: Vitals:   11/02/24 0800 11/02/24 1921 11/03/24 0354 11/03/24 0951  BP: 116/65 (!) 103/51 126/85 (!) 139/93  Pulse: 92 87 79   Resp: 18 18 18    Temp: 98.4 F (36.9 C) 97.7 F (36.5 C) 99.3 F (37.4 C)   TempSrc: Oral Oral Oral   SpO2: 96% 96% 96%   Weight:      Height:       General appearance: Awake alert.  In no distress Resp: Clear to auscultation bilaterally.  Normal effort Cardio: S1-S2 is normal regular.  No S3-S4.  No rubs murmurs or bruit GI: Abdomen is soft.  Nontender nondistended.  Bowel sounds are present normal.  No masses organomegaly    DISPOSITION: Home  Discharge Instructions     Call MD for:  difficulty breathing, headache or visual disturbances   Complete by: As directed    Call MD for:  extreme fatigue   Complete by: As directed    Call MD for:  persistant dizziness or light-headedness   Complete by: As directed    Call MD for:  persistant nausea and vomiting   Complete by: As directed    Call MD for:  severe uncontrolled pain   Complete by: As directed    Call MD for:  temperature >100.4   Complete by: As directed    Diet  general   Complete by: As directed    Discharge instructions   Complete by: As directed    Please be sure to follow-up with your primary care provider.  Take your medications as prescribed.  You were cared for by a hospitalist during your hospital stay. If you have any questions about your discharge medications or the care you received while you were in the hospital after you are discharged, you can call the unit and asked to speak with the hospitalist on call if the hospitalist that took care of you is not available. Once you are discharged, your primary care physician will  handle any further medical issues. Please note that NO REFILLS for any discharge medications will be authorized once you are discharged, as it is imperative that you return to your primary care physician (or establish a relationship with a primary care physician if you do not have one) for your aftercare needs so that they can reassess your need for medications and monitor your lab values. If you do not have a primary care physician, you can call [phone] for a physician referral.   Discharge wound care:   Complete by: As directed    Wound care  Daily      Comments: Cleanse sacral wound with soap and water , dry and apply Xeroform gauze (Lawson [phone]) to area daily. Secure with silicone foam.   Increase activity slowly   Complete by: As directed          Allergies as of 11/03/2024       Reactions   Lidocaine  Anaphylaxis   Morphine  Other (See Comments)   Body shuts down   Procaine Hcl Anaphylaxis, Rash, Other (See Comments)   Anything with 'caine' in it    Sulfonamide Derivatives Hives   Amlodipine  Swelling   Ezetimibe-simvastatin Other (See Comments)   VYTORIN = Joint pain   Latex Itching, Other (See Comments)   WELTS   Norco [hydrocodone -acetaminophen ] Nausea And Vomiting, Other (See Comments)   States does ok with IV form    Tape Itching, Other (See Comments)   Patient prefers either paper tape or Coban wrap   Tramadol  Nausea And Vomiting        Medication List     STOP taking these medications    CertaVite/Antioxidants Tabs   losartan  50 MG tablet Commonly known as: COZAAR    megestrol  40 MG tablet Commonly known as: MEGACE    sucralfate  1 GM/10ML suspension Commonly known as: CARAFATE        TAKE these medications    acetaminophen  325 MG tablet Commonly known as: TYLENOL  Take 2 tablets (650 mg total) by mouth every 6 (six) hours as needed for mild pain (pain score 1-3) or fever (or Fever >/= 101).   alum & mag hydroxide-simeth 200-200-20 MG/5ML  suspension Commonly known as: MAALOX/MYLANTA Take 30 mLs by mouth every 4 (four) hours as needed for indigestion.   amiodarone  200 MG tablet Commonly known as: PACERONE  Take 1 tablet (200 mg total) by mouth daily.   apixaban  2.5 MG Tabs tablet Commonly known as: Eliquis  Take 1 tablet (2.5 mg total) by mouth 2 (two) times daily.   atorvastatin  40 MG tablet Commonly known as: LIPITOR Take 1 tablet (40 mg total) by mouth daily.   BOOST/FIBER PO Take 1 Bottle by mouth 3 (three) times daily.   cyanocobalamin  500 MCG tablet Commonly known as: VITAMIN B12 Take 500 mcg by mouth daily.   empagliflozin  10 MG Tabs tablet Commonly known as: Jardiance  Take  1 tablet (10 mg total) by mouth daily.   furosemide  40 MG tablet Commonly known as: LASIX  Take 1 tablet (40 mg total) by mouth 2 (two) times a week. Start taking on: November 04, 2024   ipratropium 0.06 % nasal spray Commonly known as: ATROVENT  Place 2 sprays into both nostrils 2 (two) times daily as needed (allergies).   isosorbide  mononitrate 30 MG 24 hr tablet Commonly known as: IMDUR  Take 1 tablet (30 mg total) by mouth daily.   ketoconazole  2 % cream Commonly known as: NIZORAL  Apply 1 Application topically 2 (two) times daily as needed for irritation.   loratadine  10 MG tablet Commonly known as: CLARITIN  Take 10 mg by mouth daily as needed for allergies.   metoprolol  tartrate 25 MG tablet Commonly known as: LOPRESSOR  Take 1 tablet (25 mg total) by mouth 2 (two) times daily.   mirtazapine  15 MG tablet Commonly known as: REMERON  Take 1 tablet (15 mg total) by mouth at bedtime.   multivitamin Tabs tablet Take 1 tablet by mouth at bedtime.   nitroGLYCERIN  0.4 MG SL tablet Commonly known as: NITROSTAT  Place 1 tablet (0.4 mg total) under the tongue every 5 (five) minutes as needed for chest pain.   pantoprazole  40 MG tablet Commonly known as: PROTONIX  Take 1 tablet (40 mg total) by mouth daily.   senna-docusate  8.6-50 MG tablet Commonly known as: Senokot-S Take 1 tablet by mouth 2 (two) times daily. What changed: when to take this   triamcinolone  55 MCG/ACT nasal inhaler Commonly known as: NASACORT Place 1 spray into both nostrils daily as needed (for allergies).               Discharge Care Instructions  (From admission, onward)           Start     Ordered   11/03/24 0000  Discharge wound care:       Comments: Wound care  Daily      Comments: Cleanse sacral wound with soap and water , dry and apply Xeroform gauze (Lawson [phone]) to area daily. Secure with silicone foam.   11/03/24 1033              Contact information for follow-up providers     Care, [facility] Follow up.   Specialty: Home Health Services Why: Home health and PCS aide Contact information: [address] [place] [address] [phone]         Johnny Garnette LABOR, MD. Schedule an appointment as soon as possible for a visit in 1 week(s).   Specialty: Family Medicine Why: with BMP lab to look at kidney/electrolyte numbers Contact information: [address] [place] [facility] [place] [address] [phone]              Contact information for after-discharge care     Destination     [facility] .   Service: Skilled Nursing Contact information: [address] [place]  [phone]             Home Medical Care     [facility]) .   Service: Home Health Services Contact information: [address] [place]  [address] [phone]                     TOTAL DISCHARGE TIME: 35 minutes  Lasya Vetter Verdene  [facility] Pager on [email or web address]  11/03/2024, 10:33 AM    "

## 2024-11-03 NOTE — Progress Notes (Signed)
 Occupational Therapy Treatment Patient Details Name: Barbara [name] MRN: [id number] DOB: 08-14-1941 Today's Date: 11/03/2024   History of present illness 83 yo female adm 12/6 with SOB and chest pain. Admitted for NSTEMI and acute CHF exacerbation. PMH includes: CAD, HFpEF, A-fib s/p PPM, essential HTN, HLD, anxiety, CKD, anemia, Rt TSA, vertigo, Lt femur fx   OT comments  Pt progressing towards goals this session, mobilizing hallway distances without rest, min cues for upright posture with RW. Pt needs CGA for UB ADL and CGA for bed mobility. Pt presenting with impairments listed below, will follow acutely. Patient will benefit from continued inpatient follow up therapy, <3 hours/day to maximize safety/ind with ADL/functional mobility.       If plan is discharge home, recommend the following:  A little help with walking and/or transfers;Assistance with cooking/housework;Direct supervision/assist for medications management;Direct supervision/assist for financial management;Assist for transportation;Help with stairs or ramp for entrance;A little help with bathing/dressing/bathroom   Equipment Recommendations  None recommended by OT    Recommendations for Other Services PT consult    Precautions / Restrictions Precautions Precautions: Fall Recall of Precautions/Restrictions: Intact Restrictions Weight Bearing Restrictions Per Provider Order: No       Mobility Bed Mobility Overal bed mobility: Needs Assistance Bed Mobility: Supine to Sit     Supine to sit: Contact guard          Transfers Overall transfer level: Needs assistance Equipment used: Rolling walker (2 wheels) Transfers: Sit to/from Stand Sit to Stand: Contact guard assist                 Balance Overall balance assessment: Needs assistance Sitting-balance support: No upper extremity supported, Feet supported Sitting balance-Leahy Scale: Fair     Standing balance support: Bilateral upper  extremity supported, During functional activity Standing balance-Leahy Scale: Poor                             ADL either performed or assessed with clinical judgement   ADL Overall ADL's : Needs assistance/impaired                 Upper Body Dressing : Contact guard assist;Standing Upper Body Dressing Details (indicate cue type and reason): gown as a robe     Toilet Transfer: Contact guard assist;Rolling walker (2 wheels)           Functional mobility during ADLs: Contact guard assist;Rolling walker (2 wheels)      Extremity/Trunk Assessment Upper Extremity Assessment Upper Extremity Assessment: Generalized weakness   Lower Extremity Assessment Lower Extremity Assessment: Defer to PT evaluation        Vision   Vision Assessment?: No apparent visual deficits   Perception Perception Perception: Not tested   Praxis Praxis Praxis: Not tested   Communication Communication Communication: No apparent difficulties   Cognition Arousal: Alert Behavior During Therapy: WFL for tasks assessed/performed Cognition: No apparent impairments                               Following commands: Impaired Following commands impaired: Follows one step commands with increased time      Cueing   Cueing Techniques: Verbal cues, Gestural cues  Exercises      Shoulder Instructions       General Comments VSS on RA    Pertinent Vitals/ Pain       Pain Assessment Pain Assessment: No/denies  pain  Home Living                                          Prior Functioning/Environment              Frequency  Min 2X/week        Progress Toward Goals  OT Goals(current goals can now be found in the care plan section)  Progress towards OT goals: Progressing toward goals  ADL Goals Pt Will Perform Lower Body Dressing: with modified independence;sitting/lateral leans Pt Will Transfer to Toilet: with modified  independence;ambulating;regular height toilet Pt Will Perform Tub/Shower Transfer: Tub transfer;Shower transfer;with modified independence;ambulating Additional ADL Goal #1: pt will tolerate x15 min standing activity in order to improve activity tolerance for ADLs  Plan      Co-evaluation                 AM-PAC OT 6 Clicks Daily Activity     Outcome Measure   Help from another person eating meals?: None Help from another person taking care of personal grooming?: A Little Help from another person toileting, which includes using toliet, bedpan, or urinal?: A Little Help from another person bathing (including washing, rinsing, drying)?: A Lot Help from another person to put on and taking off regular upper body clothing?: A Little Help from another person to put on and taking off regular lower body clothing?: A Lot 6 Click Score: 17    End of Session Equipment Utilized During Treatment: Gait belt;Rolling walker (2 wheels)  OT Visit Diagnosis: Unsteadiness on feet (R26.81);Other abnormalities of gait and mobility (R26.89);Muscle weakness (generalized) (M62.81);History of falling (Z91.81)   Activity Tolerance Patient tolerated treatment well   Patient Left in bed;with call bell/phone within reach;with bed alarm set   Nurse Communication Mobility status        Time: 8774-8751 OT Time Calculation (min): 23 min  Charges: OT General Charges $OT Visit: 1 Visit OT Treatments $Self Care/Home Management : 8-22 mins $Therapeutic Activity: 8-22 mins  Johnanna Bakke K, OTD, OTR/L SecureChat [facility] [phone]   Laneta POUR Koonce 11/03/2024, 1:37 PM

## 2024-11-03 NOTE — Progress Notes (Incomplete Revision)
" °   11/03/24 1320  [facility] ([employer] [place])  Detailed Notice of Discharge letter created and saved Yes  Detailed Notice of Discharge Document Given to Pateint Yes  [facility]) ROI Document Created Yes  [facility] ([employer]) appeal documents uploaded to [facility] ([employer]) site Yes   Case # [id number]   "

## 2024-11-03 NOTE — Progress Notes (Signed)
" °   11/03/24 1320  [facility] ([employer] [place])  Detailed Notice of Discharge letter created and saved Yes  Detailed Notice of Discharge Document Given to Pateint Yes  [facility]) ROI Document Created Yes  [facility] ([place]) appeal documents uploaded to [facility] ([employer]) site Yes   Case # [id number] "

## 2024-11-03 NOTE — TOC Progression Note (Signed)
 Transition of Care [facility]) - Progression Note    Patient Details  Name: FLORNCE RECORD MRN: [id number] Date of Birth: [birth year]  [facility]) CM/SW Contact  Roxie KANDICE Stain, RN Phone Number: 11/03/2024, 12:05 PM  Clinical Narrative:     Patient and son aware of discharge and plans to appeal.  Expected Discharge Plan: Skilled Nursing Facility Barriers to Discharge: Other (must enter comment) (SNF Private Pay)               Expected Discharge Plan and Services In-house Referral: Clinical Social Work   Post Acute Care Choice: Skilled Nursing Facility Living arrangements for the past 2 months: Single Family Home Expected Discharge Date: 11/03/24                                     Social Drivers of Health (SDOH) Interventions SDOH Screenings   Food Insecurity: No Food Insecurity (10/09/2024)  Housing: High Risk (06/20/2024)  Transportation Needs: Unmet Transportation Needs (06/20/2024)  Utilities: Not At Risk (10/09/2024)  Alcohol Screen: Low Risk (08/05/2022)  Depression (PHQ2-9): Low Risk (04/05/2024)  Financial Resource Strain: Low Risk (08/05/2022)  Physical Activity: Insufficiently Active (07/02/2022)  Social Connections: Moderately Integrated (06/20/2024)  Stress: No Stress Concern Present (07/02/2022)  Tobacco Use: Medium Risk (10/09/2024)    Readmission Risk Interventions    05/24/2024    8:57 AM 03/11/2024    1:07 PM  Readmission Risk Prevention Plan  Transportation Screening Complete Complete  PCP or Specialist Appt within 5-7 Days  Complete  Home Care Screening  Complete  Medication Review (RN CM)  Complete  HRI or Home Care Consult Complete   Social Work Consult for Recovery Care Planning/Counseling Complete   Palliative Care Screening Not Applicable   Medication Review Oceanographer) Complete

## 2024-11-03 NOTE — Plan of Care (Signed)
" °  Problem: Education: Goal: Knowledge of General Education information will improve Description: Including pain rating scale, medication(s)/side effects and non-pharmacologic comfort measures Outcome: Progressing   Problem: Health Behavior/Discharge Planning: Goal: Ability to manage health-related needs will improve Outcome: Progressing   Problem: Clinical Measurements: Goal: Ability to maintain clinical measurements within normal limits will improve Outcome: Progressing Goal: Will remain free from infection Outcome: Progressing Goal: Diagnostic test results will improve Outcome: Progressing Goal: Respiratory complications will improve Outcome: Progressing Goal: Cardiovascular complication will be avoided Outcome: Progressing   Problem: Activity: Goal: Risk for activity intolerance will decrease Outcome: Progressing   Problem: Nutrition: Goal: Adequate nutrition will be maintained Outcome: Progressing   Problem: Coping: Goal: Level of anxiety will decrease Outcome: Progressing   Problem: Elimination: Goal: Will not experience complications related to bowel motility Outcome: Progressing Goal: Will not experience complications related to urinary retention Outcome: Progressing   Problem: Pain Managment: Goal: General experience of comfort will improve and/or be controlled Outcome: Progressing   Problem: Skin Integrity: Goal: Risk for impaired skin integrity will decrease Outcome: Not Progressing   Problem: Education: Goal: Understanding of CV disease, CV risk reduction, and recovery process will improve Outcome: Progressing Goal: Individualized Educational Video(s) Outcome: Progressing   Problem: Activity: Goal: Ability to return to baseline activity level will improve Outcome: Not Progressing   Problem: Cardiovascular: Goal: Ability to achieve and maintain adequate cardiovascular perfusion will improve Outcome: Progressing Goal: Vascular access site(s) Level  0-1 will be maintained Outcome: Progressing   Problem: Health Behavior/Discharge Planning: Goal: Ability to safely manage health-related needs after discharge will improve Outcome: Not Progressing   "

## 2024-11-04 DIAGNOSIS — E876 Hypokalemia: Secondary | ICD-10-CM | POA: Diagnosis not present

## 2024-11-04 MED ORDER — SENNOSIDES-DOCUSATE SODIUM 8.6-50 MG PO TABS
2.0000 | ORAL_TABLET | Freq: Two times a day (BID) | ORAL | Status: DC
  Administered 2024-11-04 – 2024-11-06 (×3): 2 via ORAL
  Filled 2024-11-04 (×4): qty 2

## 2024-11-04 MED ORDER — POLYETHYLENE GLYCOL 3350 17 G PO PACK
17.0000 g | PACK | Freq: Every day | ORAL | Status: DC
  Filled 2024-11-04: qty 1

## 2024-11-04 NOTE — Plan of Care (Signed)
" °  Problem: Education: Goal: Knowledge of General Education information will improve Description: Including pain rating scale, medication(s)/side effects and non-pharmacologic comfort measures Outcome: Progressing   Problem: Health Behavior/Discharge Planning: Goal: Ability to manage health-related needs will improve Outcome: Progressing   Problem: Clinical Measurements: Goal: Ability to maintain clinical measurements within normal limits will improve Outcome: Progressing Goal: Will remain free from infection Outcome: Progressing Goal: Diagnostic test results will improve Outcome: Progressing Goal: Respiratory complications will improve Outcome: Progressing Goal: Cardiovascular complication will be avoided Outcome: Progressing   Problem: Nutrition: Goal: Adequate nutrition will be maintained Outcome: Progressing   Problem: Pain Managment: Goal: General experience of comfort will improve and/or be controlled Outcome: Progressing   Problem: Elimination: Goal: Will not experience complications related to bowel motility Outcome: Progressing Goal: Will not experience complications related to urinary retention Outcome: Progressing   Problem: Safety: Goal: Ability to remain free from injury will improve Outcome: Progressing   Problem: Skin Integrity: Goal: Risk for impaired skin integrity will decrease Outcome: Progressing   Problem: Education: Goal: Understanding of CV disease, CV risk reduction, and recovery process will improve Outcome: Progressing   Problem: Activity: Goal: Ability to return to baseline activity level will improve Outcome: Progressing   "

## 2024-11-04 NOTE — Progress Notes (Signed)
 [facility] Progress Note Patient: Barbara [name] FMW:[id number] DOB: [birth year]  DOA: 10/09/2024 DOS: the patient was seen and examined on 11/04/2024  Brief Hospital Course: 84 year old female with history of CAD, PAF on Eliquis , HTN, HLD, CKD 3a, sick sinus syndrome status post pacemaker who comes into the hospital with chest pain.   Cardiology was consulted on admission, underwent left heart cardiac cath with patent proximal LAD stent with minimal restenosis, mild nonobstructive disease in the mid circumflex and mild nonobstructive disease in the mid RCA, recommending medical management. PT OT was consulted, SNF was recommended. Currently remaining medically stable.  Assesement and Plan: Chest pain, noncardiac. CAD  Elevated troponin. Cardiology consulted. She underwent left heart cardiac Cardiac Catheterization 12/8. Found patent proximal LAD stent with minimal restenosis.  Mild nonobstructive CAD and LCx, RCA as well as left main. Medical management recommended. Currently on Eliquis , Lopressor , statin, Imdur . Cardiac status is stable.   Acute on chronic diastolic CHF  Echocardiogram showed EF of 60 to 65%, apical hypertrophic cardiomyopathy, grade 2 diastolic dysfunction. Diuresis with Lasix . Continue Jardiance , continue Lasix  as needed, currently on Lasix  twice weekly Monday and Thursday.   CKD 3B/hypokalemia baseline creatinine over 2025 appears to range between 1.2 and 1.6. Current serum creatinine remaining at baseline. Potassium was supplemented.   PAF with tachybradycardia syndrome. PPM implant 7/23. Continue amiodarone , metoprolol .  Anticoagulated with apixaban .      Sick sinus syndrome status post pacemaker implant. Was on telemetry.  No events.  Currently on MedSurg.   Hypophosphatemia-CKD continue to monitor and replenish as indicated   Disposition -PT OT recommended SNF. She lives independently. SNF has been denied by the insurance. Appeal was  also denied.  TOC is following.  Patient is medically stable for discharge.    Subjective: Any complaints this morning.  Lying comfortably on the bed.  Physical Exam: General appearance: Awake alert.  In no distress Resp: Clear to auscultation bilaterally.  Normal effort Cardio: S1-S2 is normal regular.  No S3-S4.  No rubs murmurs or bruit GI: Abdomen is soft.  Nontender nondistended.  Bowel sounds are present normal.  No masses organomegaly Extremities: No edema.  Full range of motion of lower extremities. Neurologic: Alert and oriented x3.  No focal neurological deficits.    Data Reviewed:     Latest Ref Rng & Units 10/27/2024    2:20 AM 10/22/2024    2:59 AM 10/17/2024    2:48 AM  CBC  WBC 4.0 - 10.5 K/uL 5.4  5.3  5.8   Hemoglobin 12.0 - 15.0 g/dL 86.2  85.6  86.0   Hematocrit 36.0 - 46.0 % 42.1  44.7  43.1   Platelets 150 - 400 K/uL 212  290  248       Latest Ref Rng & Units 11/03/2024    2:29 AM 10/28/2024    2:31 AM 10/27/2024    2:20 AM  BMP  Glucose 70 - 99 mg/dL 81  90  93   BUN 8 - 23 mg/dL 26  31  37   Creatinine 0.44 - 1.00 mg/dL 8.65  8.49  8.51   Sodium 135 - 145 mmol/L 140  141  141   Potassium 3.5 - 5.1 mmol/L 4.2  3.2  3.7   Chloride 98 - 111 mmol/L 106  103  103   CO2 22 - 32 mmol/L 25  27  29    Calcium  8.9 - 10.3 mg/dL 89.7  89.7  89.5      Vitals:  11/03/24 1835 11/03/24 2334 11/04/24 0410 11/04/24 0735  BP: 121/60 119/84 (!) 137/95 (!) 125/90  Pulse: 77 82 97   Resp: 18 18 18 18   Temp: (!) 97.3 F (36.3 C) 98.7 F (37.1 C) 98.2 F (36.8 C) 98.2 F (36.8 C)  TempSrc: Oral Oral Oral Oral  SpO2: 96% 95% 98% 98%  Weight:   63.8 kg   Height:        Author: Joette Pebbles, MD 11/04/2024 11:11 AM  Please look on [email or web address] to find out who is on call.

## 2024-11-05 DIAGNOSIS — K219 Gastro-esophageal reflux disease without esophagitis: Secondary | ICD-10-CM | POA: Diagnosis not present

## 2024-11-05 NOTE — Plan of Care (Signed)

## 2024-11-05 NOTE — TOC Progression Note (Addendum)
 Transition of Care [facility]) - Progression Note    Patient Details  Name: Barbara [name] MRN: [id number] Date of Birth: 11/21/1940  [facility]) CM/SW Contact  Lauraine FORBES Saa, LCSWA Phone Number: 11/05/2024, 12:37 PM  Clinical Narrative:     12:37 PM Per RNCM, patient's son is interested in patient discharging to [facility] SNF under private pay. CSW informed SNF and requested SNF to follow up with patient's son. SNF informed CSW that they would not have bed availability until next week. TOC will continue to follow.  2:03 PM [facility] informed CSW that they spoke with patient's son today and are no longer able to assist. CSW made [facility] aware.  Expected Discharge Plan: Skilled Nursing Facility Barriers to Discharge: Other (must enter comment) (SNF Private Pay)               Expected Discharge Plan and Services In-house Referral: Clinical Social Work   Post Acute Care Choice: Skilled Nursing Facility Living arrangements for the past 2 months: Single Family Home Expected Discharge Date: 11/03/24                                     Social Drivers of Health (SDOH) Interventions SDOH Screenings   Food Insecurity: No Food Insecurity (10/09/2024)  Housing: High Risk (06/20/2024)  Transportation Needs: Unmet Transportation Needs (06/20/2024)  Utilities: Not At Risk (10/09/2024)  Alcohol Screen: Low Risk (08/05/2022)  Depression (PHQ2-9): Low Risk (04/05/2024)  Financial Resource Strain: Low Risk (08/05/2022)  Physical Activity: Insufficiently Active (07/02/2022)  Social Connections: Moderately Integrated (06/20/2024)  Stress: No Stress Concern Present (07/02/2022)  Tobacco Use: Medium Risk (10/09/2024)    Readmission Risk Interventions    05/24/2024    8:57 AM 03/11/2024    1:07 PM  Readmission Risk Prevention Plan  Transportation Screening Complete Complete  PCP or Specialist Appt within 5-7 Days  Complete  Home Care Screening  Complete  Medication  Review (RN CM)  Complete  HRI or Home Care Consult Complete   Social Work Consult for Recovery Care Planning/Counseling Complete   Palliative Care Screening Not Applicable   Medication Review Oceanographer) Complete

## 2024-11-05 NOTE — Progress Notes (Signed)
 [facility] Progress Note Patient: Barbara [name] FMW:[id number] DOB: Feb 12, 1941  DOA: 10/09/2024 DOS: the patient was seen and examined on 11/05/2024  Brief Hospital Course: 84 year old female with history of CAD, PAF on Eliquis , HTN, HLD, CKD 3a, sick sinus syndrome status post pacemaker who comes into the hospital with chest pain.   Cardiology was consulted on admission, underwent left heart cardiac cath with patent proximal LAD stent with minimal restenosis, mild nonobstructive disease in the mid circumflex and mild nonobstructive disease in the mid RCA, recommending medical management. PT OT was consulted, SNF was recommended. Currently remaining medically stable.  Assesement and Plan: Chest pain, noncardiac. CAD  Elevated troponin. Cardiology consulted. She underwent left heart cardiac Cardiac Catheterization 12/8. Found patent proximal LAD stent with minimal restenosis.  Mild nonobstructive CAD and LCx, RCA as well as left main. Medical management recommended. Currently on Eliquis , Lopressor , statin, Imdur . Cardiac status is stable.   Acute on chronic diastolic CHF  Echocardiogram showed EF of 60 to 65%, apical hypertrophic cardiomyopathy, grade 2 diastolic dysfunction. Diuresis with Lasix . Continue Jardiance , continue Lasix  as needed, currently on Lasix  twice weekly Monday and Thursday.   CKD 3B/hypokalemia baseline creatinine over 2025 appears to range between 1.2 and 1.6. Current serum creatinine remaining at baseline. Potassium was supplemented.   PAF with tachybradycardia syndrome. PPM implant 7/23. Continue amiodarone , metoprolol .  Anticoagulated with apixaban .      Sick sinus syndrome status post pacemaker implant. Was on telemetry.  No events.  Currently on MedSurg.   Hypophosphatemia-CKD continue to monitor and replenish as indicated  GERD Continue PPI.  Maalox.   Disposition -PT OT recommended SNF. She lives independently. SNF has been denied  by the insurance. Appeal was also denied.  TOC is following.  Patient is medically stable for discharge.    Subjective: Complains of indigestion this morning.  No other complaints offered.  Physical Exam:  General appearance: Awake alert.  In no distress Resp: Clear to auscultation bilaterally.  Normal effort Cardio: S1-S2 is normal regular.  No S3-S4.  No rubs murmurs or bruit GI: Abdomen is soft.  Nontender nondistended.  Bowel sounds are present normal.  No masses organomegaly   Data Reviewed:     Latest Ref Rng & Units 10/27/2024    2:20 AM 10/22/2024    2:59 AM 10/17/2024    2:48 AM  CBC  WBC 4.0 - 10.5 K/uL 5.4  5.3  5.8   Hemoglobin 12.0 - 15.0 g/dL 86.2  85.6  86.0   Hematocrit 36.0 - 46.0 % 42.1  44.7  43.1   Platelets 150 - 400 K/uL 212  290  248       Latest Ref Rng & Units 11/03/2024    2:29 AM 10/28/2024    2:31 AM 10/27/2024    2:20 AM  BMP  Glucose 70 - 99 mg/dL 81  90  93   BUN 8 - 23 mg/dL 26  31  37   Creatinine 0.44 - 1.00 mg/dL 8.65  8.49  8.51   Sodium 135 - 145 mmol/L 140  141  141   Potassium 3.5 - 5.1 mmol/L 4.2  3.2  3.7   Chloride 98 - 111 mmol/L 106  103  103   CO2 22 - 32 mmol/L 25  27  29    Calcium  8.9 - 10.3 mg/dL 89.7  89.7  89.5      Vitals:   11/04/24 1136 11/04/24 1646 11/04/24 2241 11/05/24 0547  BP: 102/64 132/85 128/81 123/71  Pulse:   82 75  Resp: 19 19 16 18   Temp: 98.2 F (36.8 C) 98.2 F (36.8 C) 98.2 F (36.8 C) 98.8 F (37.1 C)  TempSrc: Oral Oral Oral Oral  SpO2: 97% 97% 97% 98%  Weight:    60.4 kg  Height:        Author: Joette Pebbles, MD 11/05/2024 9:26 AM  Please look on [email or web address] to find out who is on call.

## 2024-11-05 NOTE — Progress Notes (Signed)
 Mobility Specialist Progress Note;   11/05/24 1357  Mobility  Activity Ambulated with assistance;Pivoted/transferred from bed to chair  Level of Assistance Contact guard assist, steadying assist  Assistive Device Front wheel walker  Distance Ambulated (ft) 100 ft  Activity Response Tolerated well  Mobility Referral Yes  Mobility visit 1 Mobility  Mobility Specialist Start Time (ACUTE ONLY) 1357  Mobility Specialist Stop Time (ACUTE ONLY) 1418  Mobility Specialist Time Calculation (min) (ACUTE ONLY) 21 min   Pt agreeable to mobility. Required MinG assistance for bed mobility and to safely ambulate. VSS throughout. Agreeable to sit up in chair for lunch once returned to room. Pt left in chair with all needs met, call bell in reach.   Lauraine Erm Mobility Specialist Please contact via SecureChat or [employer] [phone]

## 2024-11-06 ENCOUNTER — Other Ambulatory Visit (HOSPITAL_COMMUNITY): Payer: Self-pay

## 2024-11-06 DIAGNOSIS — I200 Unstable angina: Secondary | ICD-10-CM | POA: Diagnosis not present

## 2024-11-06 MED ORDER — ATORVASTATIN CALCIUM 40 MG PO TABS
40.0000 mg | ORAL_TABLET | Freq: Every day | ORAL | 0 refills | Status: DC
  Filled 2024-11-06: qty 30, 30d supply, fill #0

## 2024-11-06 MED ORDER — FUROSEMIDE 40 MG PO TABS
40.0000 mg | ORAL_TABLET | ORAL | 0 refills | Status: AC
  Filled 2024-11-06: qty 28, 98d supply, fill #0

## 2024-11-06 NOTE — TOC Transition Note (Signed)
 Transition of Care [facility]) - Discharge Note   Patient Details  Name: Barbara [name] MRN: [id number] Date of Birth: May 08, 1941  Transition of Care [facility]) CM/SW Contact:  Robynn Eileen Hoose, RN Phone Number: 11/06/2024, 11:27 AM   Clinical Narrative:   Darleene aware patient is being discharged home, still able to accept patient for ordered [facility] services. Contact information is on AVS.    Final next level of care: Home w Home Health Services Barriers to Discharge: Other (must enter comment) (SNF Private Pay)   Patient Goals and CMS Choice Patient states their goals for this hospitalization and ongoing recovery are:: SNF CMS Medicare.gov Compare Post Acute Care list provided to:: Patient Choice offered to / list presented to : Patient      Discharge Placement                       Discharge Plan and Services Additional resources added to the After Visit Summary for   In-house Referral: Clinical Social Work   Post Acute Care Choice: Skilled Nursing Facility                               Social Drivers of Health (SDOH) Interventions SDOH Screenings   Food Insecurity: No Food Insecurity (10/09/2024)  Housing: High Risk (06/20/2024)  Transportation Needs: Unmet Transportation Needs (06/20/2024)  Utilities: Not At Risk (10/09/2024)  Alcohol Screen: Low Risk (08/05/2022)  Depression (PHQ2-9): Low Risk (04/05/2024)  Financial Resource Strain: Low Risk (08/05/2022)  Physical Activity: Insufficiently Active (07/02/2022)  Social Connections: Moderately Integrated (06/20/2024)  Stress: No Stress Concern Present (07/02/2022)  Tobacco Use: Medium Risk (10/09/2024)     Readmission Risk Interventions    05/24/2024    8:57 AM 03/11/2024    1:07 PM  Readmission Risk Prevention Plan  Transportation Screening Complete Complete  PCP or Specialist Appt within 5-7 Days  Complete  Home Care Screening  Complete  Medication Review (RN CM)  Complete  HRI or Home Care Consult Complete    Social Work Consult for Recovery Care Planning/Counseling Complete   Palliative Care Screening Not Applicable   Medication Review Oceanographer) Complete

## 2024-11-06 NOTE — Plan of Care (Signed)

## 2024-11-06 NOTE — Progress Notes (Addendum)
 [facility] Progress Note Patient: Barbara [name] FMW:[id number] DOB: 07-Apr-1941  DOA: 10/09/2024 DOS: the patient was seen and examined on 11/06/2024  Brief Hospital Course: 84 year old female with history of CAD, PAF on Eliquis , HTN, HLD, CKD 3a, sick sinus syndrome status post pacemaker who comes into the hospital with chest pain.   Cardiology was consulted on admission, underwent left heart cardiac cath with patent proximal LAD stent with minimal restenosis, mild nonobstructive disease in the mid circumflex and mild nonobstructive disease in the mid RCA, recommending medical management. PT OT was consulted, SNF was recommended. Currently remaining medically stable.  Assesement and Plan: Chest pain, noncardiac. CAD  Elevated troponin. Cardiology consulted. She underwent left heart cardiac Cardiac Catheterization 12/8. Found patent proximal LAD stent with minimal restenosis.  Mild nonobstructive CAD and LCx, RCA as well as left main. Medical management recommended. Currently on Eliquis , Lopressor , statin, Imdur . Cardiac status is stable.   Acute on chronic diastolic CHF  Echocardiogram showed EF of 60 to 65%, apical hypertrophic cardiomyopathy, grade 2 diastolic dysfunction. Diuresis with Lasix . Continue Jardiance , continue Lasix  as needed, currently on Lasix  twice weekly Monday and Thursday.   CKD 3B/hypokalemia baseline creatinine over 2025 appears to range between 1.2 and 1.6. Current serum creatinine remaining at baseline. Potassium was supplemented. Labs being checked every few days.  Last checked on 12/31.  Will order labs for tomorrow morning.   PAF with tachybradycardia syndrome. PPM implant 7/23. Continue amiodarone , metoprolol .  Anticoagulated with apixaban .      Sick sinus syndrome status post pacemaker implant. Was on telemetry.  No events.  Currently on MedSurg.   Hypophosphatemia-CKD continue to monitor and replenish as indicated  GERD Continue  PPI.  Maalox.   Disposition -PT OT recommended SNF. She lives independently. SNF has been denied by the insurance. Appeal was also denied.  TOC is following.  Patient is medically stable for discharge.    Subjective: Mentions that her indigestion/heartburn symptoms are better.  Also had a very large bowel movement yesterday and requesting changing MiraLAX  to only as needed.  Physical Exam:  General appearance: Awake alert.  In no distress Resp: Clear to auscultation bilaterally.  Normal effort Cardio: S1-S2 is normal regular.  No S3-S4.  No rubs murmurs or bruit. Pedal edema noted. GI: Abdomen is soft.  Nontender nondistended.  Bowel sounds are present normal.  No masses organomegaly    Data Reviewed:     Latest Ref Rng & Units 10/27/2024    2:20 AM 10/22/2024    2:59 AM 10/17/2024    2:48 AM  CBC  WBC 4.0 - 10.5 K/uL 5.4  5.3  5.8   Hemoglobin 12.0 - 15.0 g/dL 86.2  85.6  86.0   Hematocrit 36.0 - 46.0 % 42.1  44.7  43.1   Platelets 150 - 400 K/uL 212  290  248       Latest Ref Rng & Units 11/03/2024    2:29 AM 10/28/2024    2:31 AM 10/27/2024    2:20 AM  BMP  Glucose 70 - 99 mg/dL 81  90  93   BUN 8 - 23 mg/dL 26  31  37   Creatinine 0.44 - 1.00 mg/dL 8.65  8.49  8.51   Sodium 135 - 145 mmol/L 140  141  141   Potassium 3.5 - 5.1 mmol/L 4.2  3.2  3.7   Chloride 98 - 111 mmol/L 106  103  103   CO2 22 - 32 mmol/L 25  27  29   Calcium  8.9 - 10.3 mg/dL 89.7  89.7  89.5      Vitals:   11/05/24 1115 11/05/24 2144 11/06/24 0347 11/06/24 0447  BP: (!) 106/94 109/68 130/66   Pulse: 82 97 70   Resp: 19 16 15    Temp: 97.6 F (36.4 C) (!) 97.5 F (36.4 C) 98.2 F (36.8 C)   TempSrc: Oral Oral Oral   SpO2: 97% 98% 97%   Weight:    61.2 kg  Height:        Author: Joette Pebbles, MD 11/06/2024 9:34 AM  Please look on [email or web address] to find out who is on call.

## 2024-11-06 NOTE — Progress Notes (Signed)
 Mobility Specialist Progress Note;   11/06/24 0924  Mobility  Activity Ambulated with assistance;Pivoted/transferred from bed to chair  Level of Assistance Contact guard assist, steadying assist  Assistive Device Front wheel walker  Distance Ambulated (ft) 100 ft  Activity Response Tolerated well  Mobility Referral Yes  Mobility visit 1 Mobility  Mobility Specialist Start Time (ACUTE ONLY) [id number]  Mobility Specialist Stop Time (ACUTE ONLY) 0942  Mobility Specialist Time Calculation (min) (ACUTE ONLY) 18 min   Pt agreeable to mobility. Required MinG assistance throughout ambulation for safety. VC required for safety as well. VSS throughout and no c/o when asked. Pt agreeable to sitting in chair at [facility]. Pt left in chair with all needs met, call bell in reach.   Lauraine Erm Mobility Specialist Please contact via SecureChat or [employer] [phone]

## 2024-11-06 NOTE — Discharge Summary (Signed)
 " [facility]  Physician Discharge Summary   Patient ID: TORIA MONTE MRN: [id number] DOB/AGE: 1941-02-21 84 y.o.  Admit date: 10/09/2024 Discharge date: 11/06/2024    PCP: Johnny Garnette LABOR, MD  DISCHARGE DIAGNOSES:   Unstable angina [facility])   NSTEMI (non-ST elevated myocardial infarction) (HCC)   Normocytic anemia   CAD (coronary artery disease/elevated troponin   PAF (paroxysmal atrial fibrillation) (HCC)   Primary hypertension   Chronic kidney disease, stage 3b (HCC)   Hyperlipidemia   Gastroesophageal reflux disease   Tachycardia-bradycardia syndrome (HCC)   Acute on chronic diastolic heart failure (HCC)   Hypokalemia   Moderate protein malnutrition   RECOMMENDATIONS FOR OUTPATIENT FOLLOW UP: Follow up with PCP for blood work in 1week and for follow up.   Home Health:PT, OT  Equipment/Devices:None   CODE STATUS:Full Code   DISCHARGE CONDITION: fair  Diet recommendation: Heart healthy  INITIAL HISTORY: 84 year old female with history of CAD, PAF on Eliquis , HTN, HLD, CKD 3a, sick sinus syndrome status post pacemaker who comes into the hospital with chest pain.   Cardiology was consulted on admission, underwent left heart cardiac cath with patent proximal LAD stent with minimal restenosis, mild nonobstructive disease in the mid circumflex and mild nonobstructive disease in the mid RCA, recommending medical management. PT OT was consulted, SNF was recommended. Currently remaining medically stable.  HOSPITAL COURSE:   Chest pain, noncardiac. CAD  Elevated troponin. Cardiology consulted. She underwent left heart cardiac Cardiac Catheterization 12/8. Found patent proximal LAD stent with minimal restenosis.  Mild nonobstructive CAD and LCx, RCA as well as left main. Medical management recommended. Currently on Eliquis , Lopressor , statin, Imdur . Cardiac status is stable.   Acute on chronic diastolic CHF  Echocardiogram showed EF of 60 to 65%, apical  hypertrophic cardiomyopathy, grade 2 diastolic dysfunction. Diuresis with Lasix . Continue Jardiance ,  currently on Lasix  twice weekly Monday and Thursday.   CKD 3B/hypokalemia baseline creatinine over 2025 appears to range between 1.2 and 1.6. Current serum creatinine remaining at baseline. Potassium was supplemented. Labs being checked every few days.  Last checked on 12/31.    PAF with tachybradycardia syndrome. PPM implant 7/23. Continue amiodarone , metoprolol .  Anticoagulated with apixaban .      Sick sinus syndrome status post pacemaker implant. Was on telemetry.  No events.  Currently on MedSurg.   Hypophosphatemia-CKD continue to monitor and replenish as indicated   GERD Continue PPI.  Maalox.   SNF denied by insurance. Going home with HH.   PERTINENT LABS:  The results of significant diagnostics from this hospitalization (including imaging, microbiology, ancillary and laboratory) are listed below for reference.      Labs:   Basic Metabolic Panel: Recent Labs  Lab 11/03/24 0229  NA 140  K 4.2  CL 106  CO2 25  GLUCOSE 81  BUN 26*  CREATININE 1.34*  CALCIUM  10.2  PHOS 2.9   Liver Function Tests: Recent Labs  Lab 11/03/24 0229  ALBUMIN 3.3*    BNP: BNP (last 3 results) Recent Labs    05/18/24 1132 06/20/24 1538 10/13/24 0404  BNP 852.3* 980.3* 960.0*      IMAGING STUDIES DG CHEST PORT 1 VIEW Result Date: 10/13/2024 EXAM: 1 VIEW(S) XRAY OF THE CHEST 10/13/2024 02:03:44 AM COMPARISON: 10/09/2024 CLINICAL HISTORY: Rales. FINDINGS: LINES, TUBES AND DEVICES: Left subclavian pacemaker. LUNGS AND PLEURA: Mild patchy right upper and left lower lobe opacities, suspicious for pneumonia. No pleural effusion. No pneumothorax. HEART AND MEDIASTINUM: Left subclavian pacemaker. No acute abnormality of the  cardiac and mediastinal silhouettes. BONES AND SOFT TISSUES: Right shoulder arthroplasty. No acute osseous abnormality. IMPRESSION: 1. Mild patchy right  upper and left lower lobe opacities, suspicious for pneumonia. Electronically signed by: Pinkie Pebbles MD 10/13/2024 02:07 AM EST RP Workstation: [id number]   CARDIAC CATHETERIZATION Result Date: 10/11/2024   Dist LM lesion is 15% stenosed.   Prox Cx lesion is 40% stenosed.   Prox RCA to Mid RCA lesion is 30% stenosed.   Ost LAD to Prox LAD lesion is 30% stenosed. Patent proximal LAD stent with minimal restenosis Mild non-obstructive disease in the mid Circumflex at the ostium of the obtuse marginal branch Mild non-obstructive disease in the mid RCA LVEDP 8 mmHg Recommendations: Continue medical management of CAD   ECHOCARDIOGRAM COMPLETE Result Date: 10/10/2024    ECHOCARDIOGRAM REPORT   Patient Name:   VEONA BITTMAN Date of Exam: 10/10/2024 Medical Rec #:  [id number]              Height:       68.0 in Accession #:    [id number]             Weight:       132.5 lb Date of Birth:  1941-04-29              BSA:          1.716 m Patient Age:    83 years               BP:           132/48 mmHg Patient Gender: F                      HR:           60 bpm. Exam Location:  Inpatient Procedure: 2D Echo, Color Doppler, Cardiac Doppler and Intracardiac            Opacification Agent (Both Spectral and Color Flow Doppler were            utilized during procedure). Indications:    CHF Acute Diastolic I50.31  History:        Patient has prior history of Echocardiogram examinations, most                 recent 05/18/2024.  Sonographer:    Tinnie Gosling RDCS Referring Phys: ROLLO JONELLE LOUDER IMPRESSIONS  1. Small spade like ventricular cavity. 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 Left ventricular ejection fraction, by estimation, is 60 to 65%. The left ventricle has normal function. The left ventricle has no regional wall motion abnormalities. There is mild left ventricular hypertrophy. Left  ventricular diastolic parameters are consistent with Grade II diastolic dysfunction (pseudonormalization). Elevated left ventricular end-diastolic pressure.  2. Device leads in RA/RV. Right ventricular systolic function is normal. The right ventricular size is normal.  3. Left atrial size was moderately dilated.  4. The mitral valve is abnormal. Mild mitral valve regurgitation. No evidence of mitral stenosis.  5. Calcified non coronary cusp. The aortic valve is tricuspid. There is moderate calcification of the aortic valve. There is moderate thickening of the aortic valve. Aortic valve regurgitation is not visualized. Aortic valve sclerosis is present, with no evidence of aortic valve stenosis.  6. The inferior vena cava is normal in size with greater than 50% respiratory variability, suggesting right atrial pressure of 3 mmHg. FINDINGS  Left  Ventricle: Small spade like ventricular cavity. 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 Left ventricular ejection fraction, by  estimation, is 60 to 65%. The left ventricle has normal function. The left ventricle has no regional wall motion abnormalities. Strain was performed and the global longitudinal strain is indeterminate. The left ventricular internal cavity size was normal in size. There is mild left ventricular hypertrophy. Left ventricular diastolic parameters are consistent with Grade II diastolic dysfunction (pseudonormalization). Elevated left ventricular end-diastolic pressure. Right Ventricle: Device leads in RA/RV. The right ventricular size is normal. No increase in right ventricular wall thickness. Right ventricular systolic function is normal. Left Atrium: Left atrial size was moderately dilated. Right Atrium: Right atrial size was normal in size. Pericardium: There is no evidence of pericardial effusion. Mitral Valve: The mitral valve is  abnormal. There is mild thickening of the mitral valve leaflet(s). There is mild calcification of the mitral valve leaflet(s). Mild mitral valve regurgitation. No evidence of mitral valve stenosis. Tricuspid Valve: The tricuspid valve is normal in structure. Tricuspid valve regurgitation is mild . No evidence of tricuspid stenosis. Aortic Valve: Calcified non coronary cusp. The aortic valve is tricuspid. There is moderate calcification of the aortic valve. There is moderate thickening of the aortic valve. Aortic valve regurgitation is not visualized. Aortic valve sclerosis is present, with no evidence of aortic valve stenosis. Pulmonic Valve: The pulmonic valve was normal in structure. Pulmonic valve regurgitation is not visualized. No evidence of pulmonic stenosis. Aorta: The aortic root is normal in size and structure. Venous: The inferior vena cava is normal in size with greater than 50% respiratory variability, suggesting right atrial pressure of 3 mmHg. IAS/Shunts: No atrial level shunt detected by color flow Doppler. Additional Comments: 3D was performed not requiring image post processing on an independent workstation and was indeterminate.  LEFT VENTRICLE PLAX 2D LVIDd:         4.10 cm   Diastology LVIDs:         2.70 cm   LV e' medial:    4.68 cm/s LV PW:         1.20 cm   LV E/e' medial:  18.4 LV IVS:        1.20 cm   LV e' lateral:   6.42 cm/s LVOT diam:     1.60 cm   LV E/e' lateral: 13.4 LV SV:         50 LV SV Index:   29 LVOT Area:     2.01 cm LV IVRT:       113 msec  RIGHT VENTRICLE             IVC RV S prime:     12.40 cm/s  IVC diam: 1.10 cm LEFT ATRIUM             Index        RIGHT ATRIUM           Index LA diam:        4.30 cm 2.51 cm/m   RA Area:     13.10 cm LA Vol (A2C):   75.1 ml 43.77 ml/m  RA Volume:   31.80 ml  18.54 ml/m LA Vol (A4C):   86.3 ml 50.30 ml/m LA Biplane Vol: 80.4 ml 46.86 ml/m  AORTIC VALVE LVOT Vmax:   138.00 cm/s LVOT Vmean:  94.500 cm/s LVOT VTI:    0.249 m  AORTA  Ao Root diam: 2.30 cm Ao Asc diam:  2.80 cm MITRAL VALVE MV Area (PHT): 2.46 cm    SHUNTS MV Decel Time: 308 msec    Systemic VTI:  0.25 m MV E velocity: 85.90 cm/s  Systemic Diam: 1.60 cm MV A velocity: 44.50 cm/s MV E/A ratio:  1.93 Maude Emmer MD Electronically signed by Maude Emmer MD Signature Date/Time: 10/10/2024/3:54:14 PM    Final    DG Chest 2 View Result Date: 10/09/2024 CLINICAL DATA:  Low back pain after fall.  Chest pain. EXAM: CHEST - 2 VIEW, LUMBAR SPINE COMPARISON:  06/20/2024, 07/03/2024. FINDINGS: Chest: The heart is enlarged and the mediastinal contour is within normal limits. A dual lead pacemaker is present over the left chest. There is eventration of the posterior left diaphragm with a associated atelectasis. No consolidation, effusion, or pneumothorax is seen. Degenerative changes are present in the thoracic spine. Shoulder arthroplasty changes are noted on the right. Lumbar spine: There is no evidence of acute fracture in the lumbar spine. Alignment appears normal. Multilevel intervertebral disc space narrowing, degenerative endplate changes, and facet arthropathy are noted. There is atherosclerotic calcification of the aorta. 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 IMPRESSION: 1. No active cardiopulmonary disease. 2. No acute fracture or subluxation in the lumbar spine. Electronically Signed   By: Leita Birmingham M.D.   On: 10/09/2024 15:18    DISCHARGE EXAMINATION: See PN from earlier today  DISPOSITION: Home  Discharge Instructions     Call MD for:  difficulty breathing, headache or visual disturbances   Complete by: As directed    Call MD for:  extreme fatigue   Complete by: As directed    Call MD for:  persistant dizziness or light-headedness   Complete by: As directed    Call MD for:  persistant nausea and vomiting   Complete by: As directed    Call MD for:  severe uncontrolled pain   Complete by: As directed    Call MD for:  temperature >100.4    Complete by: As directed    Diet general   Complete by: As directed    Discharge instructions   Complete by: As directed    Please be sure to follow-up with your primary care provider.  Take your medications as prescribed.  You were cared for by a hospitalist during your hospital stay. If you have any questions about your discharge medications or the care you received while you were in the hospital after you are discharged, you can call the unit and asked to speak with the hospitalist on call if the hospitalist that took care of you is not available. Once you are discharged, your primary care physician will handle any further medical issues. Please note that NO REFILLS for any discharge medications will be authorized once you are discharged, as it is imperative that you return to your primary care physician (or establish a relationship with a primary care physician if you do not have one) for your aftercare needs so that they can reassess your need for medications and monitor your lab values. If you do not have a primary care physician, you can call [phone] for a physician referral.   Discharge wound care:   Complete by: As directed    Wound care  Daily      Comments: Cleanse sacral wound with soap and water , dry and apply Xeroform gauze ([employer]son [phone]) to area daily. Secure with silicone foam.   Increase activity slowly  Complete by: As directed          Allergies as of 11/06/2024       Reactions   Lidocaine  Anaphylaxis   Morphine  Other (See Comments)   Body shuts down   Procaine Hcl Anaphylaxis, Rash, Other (See Comments)   Anything with 'caine' in it    Sulfonamide Derivatives Hives   Amlodipine  Swelling   Ezetimibe-simvastatin Other (See Comments)   VYTORIN = Joint pain   Latex Itching, Other (See Comments)   WELTS   Norco [hydrocodone -acetaminophen ] Nausea And Vomiting, Other (See Comments)   States does ok with IV form    Tape Itching, Other (See Comments)   Patient  prefers either paper tape or Coban wrap   Tramadol  Nausea And Vomiting        Medication List     STOP taking these medications    CertaVite/Antioxidants Tabs   losartan  50 MG tablet Commonly known as: COZAAR    megestrol  40 MG tablet Commonly known as: MEGACE    sucralfate  1 GM/10ML suspension Commonly known as: CARAFATE        TAKE these medications    acetaminophen  325 MG tablet Commonly known as: TYLENOL  Take 2 tablets (650 mg total) by mouth every 6 (six) hours as needed for mild pain (pain score 1-3) or fever (or Fever >/= 101).   alum & mag hydroxide-simeth 200-200-20 MG/5ML suspension Commonly known as: MAALOX/MYLANTA Take 30 mLs by mouth every 4 (four) hours as needed for indigestion.   amiodarone  200 MG tablet Commonly known as: PACERONE  Take 1 tablet (200 mg total) by mouth daily.   apixaban  2.5 MG Tabs tablet Commonly known as: Eliquis  Take 1 tablet (2.5 mg total) by mouth 2 (two) times daily.   atorvastatin  40 MG tablet Commonly known as: LIPITOR Take 1 tablet (40 mg total) by mouth daily.   BOOST/FIBER PO Take 1 Bottle by mouth 3 (three) times daily.   cyanocobalamin  500 MCG tablet Commonly known as: VITAMIN B12 Take 500 mcg by mouth daily.   empagliflozin  10 MG Tabs tablet Commonly known as: Jardiance  Take 1 tablet (10 mg total) by mouth daily.   furosemide  40 MG tablet Commonly known as: LASIX  Take 1 tablet (40 mg total) by mouth 2 (two) times a week. Please monitor your weights daily and take an additional dose of furosemide  if your weight goes up by 3 pounds. Start taking on: November 08, 2024   ipratropium 0.06 % nasal spray Commonly known as: ATROVENT  Place 2 sprays into both nostrils 2 (two) times daily as needed (allergies).   isosorbide  mononitrate 30 MG 24 hr tablet Commonly known as: IMDUR  Take 1 tablet (30 mg total) by mouth daily.   ketoconazole  2 % cream Commonly known as: NIZORAL  Apply 1 Application topically 2 (two)  times daily as needed for irritation.   loratadine  10 MG tablet Commonly known as: CLARITIN  Take 10 mg by mouth daily as needed for allergies.   metoprolol  tartrate 25 MG tablet Commonly known as: LOPRESSOR  Take 1 tablet (25 mg total) by mouth 2 (two) times daily.   mirtazapine  15 MG tablet Commonly known as: REMERON  Take 1 tablet (15 mg total) by mouth at bedtime.   nitroGLYCERIN  0.4 MG SL tablet Commonly known as: NITROSTAT  Place 1 tablet (0.4 mg total) under the tongue every 5 (five) minutes as needed for chest pain.   pantoprazole  40 MG tablet Commonly known as: PROTONIX  Take 1 tablet (40 mg total) by mouth daily.   senna-docusate 8.6-50 MG tablet  Commonly known as: Senokot-S Take 1 tablet by mouth 2 (two) times daily. What changed: when to take this   triamcinolone  55 MCG/ACT nasal inhaler Commonly known as: NASACORT Place 1 spray into both nostrils daily as needed (for allergies).               Discharge Care Instructions  (From admission, onward)           Start     Ordered   11/03/24 0000  Discharge wound care:       Comments: Wound care  Daily      Comments: Cleanse sacral wound with soap and water , dry and apply Xeroform gauze ([place]son [phone]) to area daily. Secure with silicone foam.   11/03/24 1033              Contact information for follow-up providers     Care, [facility] Follow up.   Specialty: Home Health Services Why: Home health and PCS aide Contact information: [address] [place] [address] [phone]         Johnny Garnette LABOR, MD. Schedule an appointment as soon as possible for a visit in 1 week(s).   Specialty: Family Medicine Why: with BMP lab to look at kidney/electrolyte numbers Contact information: [address] [place] [facility] [place] [address] [phone]              Contact information for after-discharge care     Destination     [facility] .   Service: Skilled Nursing Contact  information: [address] [place]  [phone]             Home Medical Care     [facility]) .   Service: Home Health Services Contact information: [address] [place]  [address] [phone]                     TOTAL DISCHARGE TIME: 35 mins  Wyn Nettle Verdene  [facility] Pager on [email or web address]  11/06/2024, 1:08 PM    "

## 2024-11-08 ENCOUNTER — Ambulatory Visit: Payer: Medicare Other

## 2024-11-08 ENCOUNTER — Telehealth: Payer: Self-pay

## 2024-11-08 DIAGNOSIS — I480 Paroxysmal atrial fibrillation: Secondary | ICD-10-CM

## 2024-11-08 NOTE — Telephone Encounter (Signed)
 Copied from CRM [id number]. Topic: Clinical - Home Health Verbal Orders >> Nov 08, 2024  1:26 PM Charolett CROME wrote: Caller/Agency: [facility] Number: [phone] Service Requested: Skilled Nursing Frequency: 2 week 1x, 1 week 2x Any new concerns about the patient? No

## 2024-11-08 NOTE — Progress Notes (Deleted)
 "  Cardiology Office Note    Date:  11/08/2024  ID:  Barbara [name], DOB 1941-05-13, MRN [id number] PCP:  Johnny Garnette LABOR, MD  Cardiologist:  Annabella Scarce, MD  Electrophysiologist:  Soyla Gladis Norton, MD   Chief Complaint: ***  History of Present Illness: Barbara [name] is a 84 y.o. female with visit-pertinent history of CAD s/p BMS to LAD ~2000, apical variant HCM, mild MR, OSA managed by pulm, sick sinus syndrome s/p STJ PPM 2023, persistent atrial fibrillation, CKD stage 3b, hypertension, hyperlipidemia seen for follow-up. She has seen multiple prior providers including Dr. Lavona, Dr. Burnard and more recently Dr. Kate. She also follows with Dr. Norton for EP.   She underwent BMS to LAD ~2000. In 2023 she required PPM. She has a history of atrial fibrillation. She was previously trialed on Tikosyn  stopped due to QT prolongation with subsequent initiation of amiodarone  in 2023. In 06/2024 she was admitted with recurrent atrial fibrillation for several days per device interrogation. She was started on IV amiodarone  and converted to sinus with atrial pacing with subsequent transition to oral amiodarone . Troponin was elevated to 400 range, further eval not pursued. She was readmitted 10/2024 with chest pain and elevated troponin peak of 105. Cath 10/11/24 showed  nonobstructive CAD (15% distal left main, 40% proximal LCx, 30% proximal to mid RCA, 30% ostial to proximal LAD), LVEDP 8. She was also managed with IV Lasix  for a/c HFpEF at that time. Regarding hx of apical HCM, CMR 05/2020 showed apical hypertrophy measuring up to 15 mm, consistent with apical HCM, patchy LGE at apex and RV insertion site (2% of total myocardial mass), normal biventricular size and hyperdynamic systolic function. Last echo 10/09/24 showed small spade like ventricular cavity, apical HCM, EF 60-65%, G2DD, moderate LAE, mild MR, aortic sclerosis without stenosis.   Ep f/u afib given breakthrough   Cath site Repeat CXR given amio and last CXR abnl   CAD with HLD Apical HCM with chronic HFpEF Persistent atrial fibrillation, also hx of SSS s/p PPM CKD 3b HTN Mild MR***   Labwork independently reviewed: 11/03/24 K 4.2, Cr 1.34, alb 3.3 10/2024 Mg OK, Hgb 13.7, plt OKAST ALT OK, alk phos 135, LDL 35, trig 177, TSH and CK OK  ROS: .    Please see the history of present illness. Otherwise, review of systems is positive for ***.  All other systems are reviewed and otherwise negative.  Studies Reviewed: SABRA    EKG:  EKG is ordered today, personally reviewed, demonstrating ***  CV Studies: Cardiac studies reviewed are outlined and summarized above. Otherwise please see EMR for full report.   Current Reported Medications:.    Active Medications[1]  Physical Exam:    VS:  There were no vitals taken for this visit.   Wt Readings from Last 3 Encounters:  11/06/24 134 lb 14.7 oz (61.2 kg)  10/05/24 130 lb (59 kg)  08/02/24 119 lb 6.4 oz (54.2 kg)    GEN: Well nourished, well developed in no acute distress NECK: No JVD; No carotid bruits CARDIAC: ***RRR, no murmurs, rubs, gallops RESPIRATORY:  Clear to auscultation without rales, wheezing or rhonchi  ABDOMEN: Soft, non-tender, non-distended EXTREMITIES:  No edema; No acute deformity   Asessement and Plan:.     ***     Disposition: F/u with ***  Signed, Santiel Topper N Carlo Guevarra, PA-C      [1]  No outpatient medications have been marked as taking for the  11/09/24 encounter (Appointment) with Teressa Mcglocklin N, PA-C.   "

## 2024-11-08 NOTE — Telephone Encounter (Signed)
 Please okay these orders  ?

## 2024-11-09 ENCOUNTER — Ambulatory Visit: Admitting: Physician Assistant

## 2024-11-09 NOTE — Telephone Encounter (Signed)
 Called and spoke with [facility], VO given per Dr. Johnny  for skilled nursing

## 2024-11-10 ENCOUNTER — Ambulatory Visit: Payer: Self-pay | Admitting: Cardiology

## 2024-11-10 LAB — CUP PACEART REMOTE DEVICE CHECK
Battery Remaining Longevity: 136 mo
Battery Voltage: 3.03 V
Brady Statistic AP VP Percent: 0.67 %
Brady Statistic AP VS Percent: 94.87 %
Brady Statistic AS VP Percent: 0.38 %
Brady Statistic AS VS Percent: 4.11 %
Brady Statistic RA Percent Paced: 80.79 %
Brady Statistic RV Percent Paced: 5.2 %
Date Time Interrogation Session: 20260104212017
Implantable Lead Connection Status: 753985
Implantable Lead Connection Status: 753985
Implantable Lead Implant Date: 20230705
Implantable Lead Implant Date: 20230705
Implantable Lead Location: 753859
Implantable Lead Location: 753860
Implantable Lead Model: 3830
Implantable Lead Model: 5076
Implantable Pulse Generator Implant Date: 20230705
Lead Channel Impedance Value: 342 Ohm
Lead Channel Impedance Value: 342 Ohm
Lead Channel Impedance Value: 456 Ohm
Lead Channel Impedance Value: 475 Ohm
Lead Channel Pacing Threshold Amplitude: 0.75 V
Lead Channel Pacing Threshold Amplitude: 1 V
Lead Channel Pacing Threshold Pulse Width: 0.4 ms
Lead Channel Pacing Threshold Pulse Width: 0.4 ms
Lead Channel Sensing Intrinsic Amplitude: 1.625 mV
Lead Channel Sensing Intrinsic Amplitude: 1.625 mV
Lead Channel Sensing Intrinsic Amplitude: 14.25 mV
Lead Channel Sensing Intrinsic Amplitude: 14.25 mV
Lead Channel Setting Pacing Amplitude: 1.5 V
Lead Channel Setting Pacing Amplitude: 2 V
Lead Channel Setting Pacing Pulse Width: 0.4 ms
Lead Channel Setting Sensing Sensitivity: 0.9 mV
Zone Setting Status: 755011
Zone Setting Status: 755011

## 2024-11-11 NOTE — Progress Notes (Signed)
 Remote PPM Transmission

## 2024-11-15 ENCOUNTER — Telehealth: Payer: Self-pay | Admitting: *Deleted

## 2024-11-15 NOTE — Telephone Encounter (Signed)
 Please okay these orders  ?

## 2024-11-15 NOTE — Telephone Encounter (Signed)
 Spoke with Sree with [facility], advised VO requsted have been approved by Dr Johnny, voiced understanding

## 2024-11-15 NOTE — Telephone Encounter (Signed)
 Copied from CRM #[id number]. Topic: Clinical - Home Health Verbal Orders >> Nov 15, 2024  1:32 PM Thersia BROCKS wrote: Caller/Agency: [facility]  Callback Number: [phone] Service Requested: social worker and nurse  Frequency: 1 time a week 4 weeks  Any new concerns about the patient? No

## 2024-11-16 ENCOUNTER — Encounter: Payer: Self-pay | Admitting: Podiatry

## 2024-11-16 ENCOUNTER — Ambulatory Visit: Admitting: Podiatry

## 2024-11-16 ENCOUNTER — Ambulatory Visit: Admitting: Family Medicine

## 2024-11-16 ENCOUNTER — Telehealth: Payer: Self-pay | Admitting: *Deleted

## 2024-11-16 ENCOUNTER — Encounter: Payer: Self-pay | Admitting: Family Medicine

## 2024-11-16 VITALS — BP 112/58 | HR 60 | Temp 98.0°F | Wt 137.0 lb

## 2024-11-16 DIAGNOSIS — M79674 Pain in right toe(s): Secondary | ICD-10-CM

## 2024-11-16 DIAGNOSIS — I10 Essential (primary) hypertension: Secondary | ICD-10-CM | POA: Diagnosis not present

## 2024-11-16 DIAGNOSIS — I480 Paroxysmal atrial fibrillation: Secondary | ICD-10-CM | POA: Diagnosis not present

## 2024-11-16 DIAGNOSIS — I5032 Chronic diastolic (congestive) heart failure: Secondary | ICD-10-CM | POA: Diagnosis not present

## 2024-11-16 DIAGNOSIS — M25471 Effusion, right ankle: Secondary | ICD-10-CM | POA: Diagnosis not present

## 2024-11-16 DIAGNOSIS — N1832 Chronic kidney disease, stage 3b: Secondary | ICD-10-CM

## 2024-11-16 DIAGNOSIS — R627 Adult failure to thrive: Secondary | ICD-10-CM | POA: Diagnosis not present

## 2024-11-16 DIAGNOSIS — M79675 Pain in left toe(s): Secondary | ICD-10-CM | POA: Diagnosis not present

## 2024-11-16 DIAGNOSIS — E876 Hypokalemia: Secondary | ICD-10-CM

## 2024-11-16 DIAGNOSIS — B351 Tinea unguium: Secondary | ICD-10-CM | POA: Diagnosis not present

## 2024-11-16 DIAGNOSIS — I2510 Atherosclerotic heart disease of native coronary artery without angina pectoris: Secondary | ICD-10-CM | POA: Diagnosis not present

## 2024-11-16 DIAGNOSIS — N390 Urinary tract infection, site not specified: Secondary | ICD-10-CM

## 2024-11-16 DIAGNOSIS — M25472 Effusion, left ankle: Secondary | ICD-10-CM | POA: Diagnosis not present

## 2024-11-16 LAB — BASIC METABOLIC PANEL WITH GFR
BUN: 22 mg/dL (ref 6–23)
CO2: 26 meq/L (ref 19–32)
Calcium: 10.4 mg/dL (ref 8.4–10.5)
Chloride: 107 meq/L (ref 96–112)
Creatinine, Ser: 1.22 mg/dL — ABNORMAL HIGH (ref 0.40–1.20)
GFR: 40.42 mL/min — ABNORMAL LOW
Glucose, Bld: 105 mg/dL — ABNORMAL HIGH (ref 70–99)
Potassium: 3.1 meq/L — ABNORMAL LOW (ref 3.5–5.1)
Sodium: 141 meq/L (ref 135–145)

## 2024-11-16 MED ORDER — CEPHALEXIN 500 MG PO CAPS: 500.0000 mg | ORAL_CAPSULE | Freq: Three times a day (TID) | ORAL | 0 refills | Status: AC

## 2024-11-16 MED ORDER — ROSUVASTATIN CALCIUM 10 MG PO TABS: 10.0000 mg | ORAL_TABLET | Freq: Every day | ORAL | Status: AC

## 2024-11-16 MED ORDER — CIPROFLOXACIN HCL 500 MG PO TABS: 500.0000 mg | ORAL_TABLET | Freq: Two times a day (BID) | ORAL | 0 refills | Status: DC

## 2024-11-16 NOTE — Telephone Encounter (Signed)
 Copied from CRM [id number]. Topic: Clinical - Prescription Issue >> Nov 16, 2024  2:34 PM Zebedee SAUNDERS wrote: Reason for CRM: Pharmacy called regarding medication interaction ciprofloxacin  (CIPRO ) 500 MG tablet and amiodarone  (PACERONE ) 200 MG tablet. Please call [facility] [address] [place] [address] Phone: [phone] Fax: [phone]

## 2024-11-16 NOTE — Progress Notes (Signed)
" ° °  Subjective:    Patient ID: Barbara [name], female    DOB: 08-Aug-1941, 84 y.o.   MRN: [id number]  HPI Here to follow up on a hospital stay from 10-09-24 to 10-27-24 for a NSTEMI. She presented with chest pain and troponins were elevated. No new EKG changes were seen. An ECHO showed EF to be 60-65% with no LV wall motion abnomalities. Showed mild LVH. Grade II diastolic dysfunction. CXR was clear. A left heart cath showed non-obstructive CAD so medical management was recommended. Her potassium was low at 3.1, and this was up to 3.7 at DC. Her creatinine at DC was 1.48 with a GFR of 34. A urine culture grew 30,000 colonies of Proteus mirabilis, but it is not clear whether this was treated. She was advised to go to a SNF but her insurance would not cover this, so she went back home. She is visited by [facility] twice a week for PT and OT. Since going home she has felt fairly well other then generalized weakness. She says her appetite is good. No chest pain or SOB. She has mild ankle swelling but this is much better than it was in the hospital.    Review of Systems  Constitutional: Negative.   Respiratory: Negative.    Cardiovascular:  Positive for leg swelling. Negative for chest pain and palpitations.  Gastrointestinal: Negative.   Genitourinary: Negative.   Neurological:  Positive for weakness.       Objective:   Physical Exam Constitutional:      Comments: In a wheelchair   Cardiovascular:     Rate and Rhythm: Normal rate and regular rhythm.     Pulses: Normal pulses.     Heart sounds: Normal heart sounds.  Pulmonary:     Effort: Pulmonary effort is normal.     Breath sounds: Normal breath sounds.  Abdominal:     Tenderness: There is no right CVA tenderness or left CVA tenderness.  Musculoskeletal:     Comments: 1+ edema in both ankles   Neurological:     Mental Status: She is alert and oriented to person, place, and time.           Assessment & Plan:  She  is recovering from a recent NSTEMI, and she seems to be doing as well as can be expected. Her BP is stable. Her PAF is stable, and today she is in NSR. Her CAD seems to be stable. She will follow up with Cardiology soon. We will get a BMET today to follow up on CKD and hypokalemia. Her failure to thrive continues as before although her appetite has improved. She will continue to get PT at home. We will treat the UTI with 7 days of Cipro . I personally spent a total of 35 minutes in the care of the patient today including getting/reviewing separately obtained history, performing a medically appropriate exam/evaluation, placing orders, independently interpreting results, communicating results, and coordinating care.  Garnette Olmsted, MD   "

## 2024-11-16 NOTE — Telephone Encounter (Signed)
 I understand. I have cancelled the Cipro  and sent in Keflex  instead

## 2024-11-17 ENCOUNTER — Telehealth: Payer: Self-pay

## 2024-11-17 ENCOUNTER — Ambulatory Visit: Payer: Self-pay | Admitting: Family Medicine

## 2024-11-17 MED ORDER — POTASSIUM CHLORIDE ER 10 MEQ PO TBCR: 10.0000 meq | EXTENDED_RELEASE_TABLET | Freq: Two times a day (BID) | ORAL | 3 refills | Status: AC

## 2024-11-17 NOTE — Telephone Encounter (Signed)
 Spoke with [facility] agent regarding pt request for a Aide to help with meal preparation and hygiene care per Dr Johnny. Agent stated that she will have a nurse form [employer] call our office  with the requirements needed for this request.

## 2024-11-17 NOTE — Telephone Encounter (Signed)
 Spoke with pt pharmacy advised of the change with pt antibiotic. Agent confirmed they received the new Rx for Keflex  stated that Rx is ready and they have notified pt for pick up.

## 2024-11-17 NOTE — Addendum Note (Signed)
 Addended by: JOHNNY SENIOR A on: 11/17/2024 07:50 AM   Modules accepted: Orders

## 2024-11-17 NOTE — Telephone Encounter (Signed)
 Patient called. Relayed the results to patient and let her know the medication is ready for pick up. Is scheduled for another lab appointment in 90 days for BMET.

## 2024-11-18 ENCOUNTER — Telehealth: Payer: Self-pay

## 2024-11-18 NOTE — Telephone Encounter (Signed)
 Yes, we have stopped Atorvastatin  and she is now taking Rosuvastatin  instead

## 2024-11-18 NOTE — Telephone Encounter (Signed)
 Copied from CRM #[id number]. Topic: Clinical - Medication Question >> Nov 18, 2024 11:45 AM Hadassah PARAS wrote: Reason for CRM: Pt would like to know if PCP has advised to discontinue Lipitor. Please advise pt on #[phone]

## 2024-11-22 ENCOUNTER — Encounter: Payer: Self-pay | Admitting: Podiatry

## 2024-11-22 NOTE — Progress Notes (Signed)
"  °  Subjective:  Patient ID: Barbara [name], female    DOB: 1941-05-08,  MRN: [id number]  Barbara [name] presents to clinic today for painful mycotic toenails of both feet that are difficult to trim. Pain interferes with daily activities and wearing enclosed shoe gear comfortably.  Chief Complaint  Patient presents with   RFC     Non diabetic toenail trim. Recently discharged from hospital.    New problem(s): None.   PCP is Johnny Garnette LABOR, MD.  Allergies[1]  Review of Systems: Negative except as noted in the HPI.  Objective: No changes noted in today's physical examination. There were no vitals filed for this visit. Barbara [name] is a pleasant 84 y.o. female thin build in NAD. AAO x 3.  Vascular Examination: Capillary refill time <3 seconds b/l LE. Palpable pedal pulses b/l LE. Digital hair present b/l. No pedal edema b/l. Skin temperature gradient WNL b/l. No varicosities b/l. No cyanosis or clubbing noted b/l LE.Barbara [name]  Dermatological Examination: Pedal skin with normal turgor, texture and tone b/l. No open wounds. No interdigital macerations b/l. Toenails 1-5 b/l thickened, discolored, dystrophic with subungual debris. There is pain on palpation to dorsal aspect of nailplates. No corns, calluses nor porokeratotic lesions noted..  Neurological Examination: Protective sensation intact with 10 gram monofilament b/l LE. Vibratory sensation intact b/l LE.   Musculoskeletal Examination: Muscle strength 5/5 to all lower extremity muscle groups bilaterally. No pain, crepitus or joint limitation noted with ROM bilateral LE. No gross bony deformities bilaterally. Utilizes cane for ambulation assistance.  Assessment/Plan: 1. Pain due to onychomycosis of toenails of both feet   Patient was evaluated and treated. All patient's and/or POA's questions/concerns addressed on today's visit. Toenails 1-5 b/l debrided in length and girth without incident. Continue soft, supportive  shoe gear daily. Report any pedal injuries to medical professional. Call office if there are any questions/concerns. -Patient/POA to call should there be question/concern in the interim.   Return in about 3 months (around 02/14/2025).  Delon LITTIE Merlin, DPM      [place] LOCATION: [address] [place], [place] [address]                   Office [phone]   [place] LOCATION: [address] [place], [place] [address] Office [phone]     [1]  Allergies Allergen Reactions   Lidocaine  Anaphylaxis   Morphine  Other (See Comments)    Body shuts down   Procaine Hcl Anaphylaxis, Rash and Other (See Comments)    Anything with 'caine' in it    Sulfonamide Derivatives Hives   Amlodipine  Swelling   Ezetimibe-Simvastatin Other (See Comments)    VYTORIN = Joint pain   Latex Itching and Other (See Comments)    WELTS   Norco [Hydrocodone -Acetaminophen ] Nausea And Vomiting and Other (See Comments)    States does ok with IV form    Tape Itching and Other (See Comments)    Patient prefers either paper tape or Coban wrap   Tramadol  Nausea And Vomiting   "

## 2024-11-22 NOTE — Telephone Encounter (Signed)
 Patient called in to make sure she is suppose to no longer taking atorvastatin  anymore. I advised her that she is not,and is suppose to be taking rosuvastatin  now.

## 2024-11-22 NOTE — Telephone Encounter (Signed)
 Noted.

## 2024-11-23 ENCOUNTER — Ambulatory Visit: Admitting: Family Medicine

## 2024-11-23 ENCOUNTER — Encounter: Payer: Self-pay | Admitting: Family Medicine

## 2024-11-23 DIAGNOSIS — Z0000 Encounter for general adult medical examination without abnormal findings: Secondary | ICD-10-CM

## 2024-11-23 NOTE — Progress Notes (Incomplete)
 "  Primary Care Physician: Johnny Garnette LABOR, MD Primary Cardiologist: Lonni LITTIE Nanas, MD Electrophysiologist: Will Gladis Norton, MD  Referring Physician: Soyla Gladis Norton, MD  Barbara [name] is a 84 y.o. 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  The patient was initially diagnosed with atrial fibrillation/flutter on 11/10/2017 during office visit with Dr. Burnard and was started on Eliquis  and metoprolol .  She was admitted on 11/17/2017 with complaints of shortness of breath and palpitations and was found to be bradycardic with metoprolol  held and development of AF with RVR.  She was started on Tikosyn  during admission and seen in follow-up by Arland Don, NP on 12/01/2017 and was maintaining sinus rhythm.  She presented to the ED on 02/25/2019 with complaint of chest pain and found to have NSTEMI with LHC showing nonobstructive CAD and patent proximal LAD stent.  She was admitted on 05/03/2022 with NSTEMI and recurrent AF with RVR with heart rates falling to the 20s and 30s.  LHC was completed showing 30% in-stent restenosis in proximal LAD otherwise no critical lesions.  She was evaluated by EP and underwent placement of a Medtronic dual-chamber PPM on 05/08/2022.  She experienced prolonged QT during procedure and Tikosyn  was discontinued and was discharged on amiodarone .  She was seen by Daphne Barrack, NP on 02/10/24 and amiodarone  was held x 3 months morning prior due to elevated liver enzymes.  She was admitted on 06/21/2024 with AF with RVR and started on amiodarone  drip with plan for DCCV however conversion to sinus rhythm and discharged with amiodarone  200 mg twice daily.  She was seen by Dr. Barnetta on 08/02/2024 with amiodarone  decreased to 200 mg once daily.    She was admitted on  10/09/2024 with complaint of chest pain requiring nitroglycerin  x 3 and underwent repeat LHC that showed nonobstructive CAD with 15% distal left main, 40% proximal left circumflex and 30% proximal to mid RCA and medical therapy recommended.  She was recommended to discharge to SNF however insurance would not cover and therefore was discharged back home.  She had device interrogation completed that showed persistent AF with advisement to follow-up in the AF clinic.  She has been receiving PT OT by [facility] home health twice weekly and was seen by her PCP on 11/16/2024 and was recovering well with improved appetite and maintaining sinus rhythm.  She was started on Keflex  for UTI that appeared during her recent hospitalization and potassium supplement for hypokalemia.  Patient presents today for follow up for atrial fibrillation. ***  Today, she denies symptoms of ***palpitations, chest pain, shortness of breath, orthopnea, PND, lower extremity edema, dizziness, presyncope, syncope, snoring, daytime somnolence, bleeding, or neurologic sequela. The patient is tolerating medications without difficulties and is otherwise without complaint today.   ***Discussed the use of AI scribe software for clinical note transcription with the patient, who gave verbal consent to proceed.   Notes: Recent UTI during latest hospitalization may have contributed to more persistent AF.  16.8% burden - Will need to hold Jardiance  x 3 days and may increase Amio to 200 twice a day then cardioversion   Atrial Fibrillation Management history: History of Sleep Apnea Previous antiarrhythmic drugs: Tikosyn  discontinued on 05/2022 due to long QT, amiodarone  Previous cardioversions: None Previous ablations: None Anticoagulation history: Eliquis   ROS- All systems are  reviewed and negative except as per the HPI above.  Past Medical History:  Diagnosis Date   A-fib [facility])    Allergy    CAD (coronary artery disease)    sees Dr. Debby Sor  cardiac stents - 2000   CHF (congestive heart failure) (HCC)    Colon polyps    Complication of anesthesia    rash/hives with caines   Dyspnea    02/12/18  when my heart gets out of rhythm, it has not been out of rhythym- since I have been on Tikosyn  (11/2017)   Dysrhythmia    afib fib   GERD (gastroesophageal reflux disease)    takes OTC- Omeprazole - prn   Heart murmur    History of kidney stones    History of stress test    show normal perfusion without scar or ischemia, post EF 68%   Hx of echocardiogram    show an EF 55%-60% range with grade 1 diastolic dysfunction, she had mitral anular calcification with mild MR, moderate LA dilation and mild pulmonary hypertension with a PA estimated pressure of 39mm   Hyperlipidemia    Hypertension    NSTEMI (non-ST elevated myocardial infarction) (HCC)    Osteoarthritis    Pacemaker    Pneumonia    hx of 2015    PONV (postoperative nausea and vomiting)    Sleep apnea    Past Surgical History:  Procedure Laterality Date   CARDIAC CATHETERIZATION     11/2017   cardiac stents  2000   COLONOSCOPY  01-05-14   per Dr. Norleen Hint, clear, no repeats needed    COLONOSCOPY     CORONARY STENT PLACEMENT  2000   in LAD   CYSTOSCOPY WITH RETROGRADE PYELOGRAM, URETEROSCOPY AND STENT PLACEMENT Left 06/11/2022   Procedure: CYSTOSCOPY WITH RETROGRADE PYELOGRAM, LEFT STENT PLACEMENT;  Surgeon: Carolee Sherwood JONETTA DOUGLAS, MD;  Location: [facility] ORS;  Service: Urology;  Laterality: Left;   CYSTOSCOPY/URETEROSCOPY/HOLMIUM LASER/STENT PLACEMENT Left 06/25/2022   Procedure: CYSTOSCOPY LEFT URETEROSCOPY/HOLMIUM LASER/STENT PLACEMENT;  Surgeon: Watt Norleen, MD;  Location: [facility];  Service: Urology;  Laterality: Left;  1 HR FOR THIS CASE   DIRECT LARYNGOSCOPY WITH RADIAESSE INJECTION N/A 02/13/2018   Procedure: DIRECT LARYNGOSCOPY WITH RADIAESSE INJECTION;  Surgeon: Carlie Clark, MD;  Location: [facility] OR;  Service: ENT;  Laterality: N/A;   ESOPHAGOGASTRODUODENOSCOPY N/A  03/11/2024   Procedure: EGD (ESOPHAGOGASTRODUODENOSCOPY);  Surgeon: Rosalie Kitchens, MD;  Location: [facility] ENDOSCOPY;  Service: Gastroenterology;  Laterality: N/A;   EYE SURGERY Left    CATARACT REMOVAL   HIP ARTHROPLASTY Left 09/27/2023   Procedure: ARTHROPLASTY BIPOLAR HIP (HEMIARTHROPLASTY);  Surgeon: Beverley Evalene JONETTA, MD;  Location: [facility] OR;  Service: Orthopedics;  Laterality: Left;   KNEE ARTHROSCOPY Left 01/06/2015   Procedure: LEFT KNEE ARTHROSCOPY, abrasion chondroplasty of the medial femerol condryl,medial and lateral menisectomy, microfracture , synovectomy of the suprpatellar pouch;  Surgeon: Tanda Heading, MD;  Location: [facility] ORS;  Service: Orthopedics;  Laterality: Left;   LEFT HEART CATH AND CORONARY ANGIOGRAPHY N/A 02/26/2019   Procedure: LEFT HEART CATH AND CORONARY ANGIOGRAPHY;  Surgeon: Sor Debby LABOR, MD;  Location: [facility] INVASIVE CV LAB;  Service: Cardiovascular;  Laterality: N/A;   LEFT HEART CATH AND CORONARY ANGIOGRAPHY N/A 05/06/2022   Procedure: LEFT HEART CATH AND CORONARY ANGIOGRAPHY;  Surgeon: Mady Bruckner, MD;  Location: [facility] INVASIVE CV LAB;  Service: Cardiovascular;  Laterality: N/A;   LEFT HEART CATH AND CORONARY ANGIOGRAPHY N/A 10/11/2024   Procedure: LEFT HEART CATH AND CORONARY  ANGIOGRAPHY;  Surgeon: Verlin Lonni BIRCH, MD;  Location: [facility] INVASIVE CV LAB;  Service: Cardiovascular;  Laterality: N/A;   MICROLARYNGOSCOPY W/VOCAL CORD INJECTION N/A 08/07/2018   Procedure: MICROLARYNGOSCOPY WITH VOCAL CORD INJECTION OF PROLARYN;  Surgeon: Carlie Clark, MD;  Location: [facility] OR;  Service: ENT;  Laterality: N/A;  JET VENTILATION   PACEMAKER IMPLANT N/A 05/08/2022   Procedure: PACEMAKER IMPLANT;  Surgeon: Inocencio Soyla Lunger, MD;  Location: [facility] INVASIVE CV LAB;  Service: Cardiovascular;  Laterality: N/A;   REVERSE SHOULDER ARTHROPLASTY Right 03/23/2020   Procedure: REVERSE SHOULDER ARTHROPLASTY;  Surgeon: Sharl Selinda Dover, MD;  Location[facility] OR;  Service: Orthopedics;  Laterality: Right;    VAGINAL HYSTERECTOMY  1971   Lidocaine , Morphine , Procaine hcl, Sulfonamide derivatives, Amlodipine , Ezetimibe-simvastatin, Latex, Norco [hydrocodone -acetaminophen ], Tape, and Tramadol  Current Outpatient Medications  Medication Sig Dispense Refill   acetaminophen  (TYLENOL ) 325 MG tablet Take 2 tablets (650 mg total) by mouth every 6 (six) hours as needed for mild pain (pain score 1-3) or fever (or Fever >/= 101).     alum & mag hydroxide-simeth (MAALOX/MYLANTA) 200-200-20 MG/5ML suspension Take 30 mLs by mouth every 4 (four) hours as needed for indigestion. 355 mL 0   amiodarone  (PACERONE ) 200 MG tablet Take 1 tablet (200 mg total) by mouth daily. 90 tablet 3   apixaban  (ELIQUIS ) 2.5 MG TABS tablet Take 1 tablet (2.5 mg total) by mouth 2 (two) times daily. 180 tablet 3   cephALEXin  (KEFLEX ) 500 MG capsule Take 1 capsule (500 mg total) by mouth 3 (three) times daily. 21 capsule 0   cyanocobalamin  (VITAMIN B12) 500 MCG tablet Take 500 mcg by mouth daily.     empagliflozin  (JARDIANCE ) 10 MG TABS tablet Take 1 tablet (10 mg total) by mouth daily. 90 tablet 3   furosemide  (LASIX ) 40 MG tablet Take 1 tablet (40 mg total) by mouth 2 (two) times a week. Please monitor your weights daily and take an additional dose of furosemide  if your weight goes up by 3 pounds. 60 tablet 0   ipratropium (ATROVENT ) 0.06 % nasal spray Place 2 sprays into both nostrils 2 (two) times daily as needed (allergies).  5   isosorbide  mononitrate (IMDUR ) 30 MG 24 hr tablet Take 1 tablet (30 mg total) by mouth daily. 90 tablet 3   ketoconazole  (NIZORAL ) 2 % cream Apply 1 Application topically 2 (two) times daily as needed for irritation. 30 g 5   loratadine  (CLARITIN ) 10 MG tablet Take 10 mg by mouth daily as needed for allergies.     metoprolol  tartrate (LOPRESSOR ) 25 MG tablet Take 1 tablet (25 mg total) by mouth 2 (two) times daily. 60 tablet 11   mirtazapine  (REMERON ) 15 MG tablet Take 1 tablet (15 mg total) by mouth at  bedtime. 30 tablet 5   nitroGLYCERIN  (NITROSTAT ) 0.4 MG SL tablet Place 1 tablet (0.4 mg total) under the tongue every 5 (five) minutes as needed for chest pain. 60 tablet 5   Nutritional Supplements (BOOST/FIBER PO) Take 1 Bottle by mouth 3 (three) times daily.     pantoprazole  (PROTONIX ) 40 MG tablet Take 1 tablet (40 mg total) by mouth daily. 90 tablet 3   potassium chloride  (KLOR-CON  10) 10 MEQ tablet Take 1 tablet (10 mEq total) by mouth in the morning and at bedtime. 180 tablet 3   rosuvastatin  (CRESTOR ) 10 MG tablet Take 1 tablet (10 mg total) by mouth daily.     senna-docusate (SENOKOT-S) 8.6-50 MG tablet Take 1 tablet by mouth 2 (  two) times daily. (Patient taking differently: Take 1 tablet by mouth daily.)     triamcinolone  (NASACORT) 55 MCG/ACT nasal inhaler Place 1 spray into both nostrils daily as needed (for allergies).      No current facility-administered medications for this visit.    Physical Exam: There were no vitals taken for this visit.  GEN: Well nourished, well developed in no acute distress NECK: No JVD; No carotid bruits CARDIAC: {EPRHYTHM:28826}, no murmurs, rubs, gallops RESPIRATORY:  Clear to auscultation without rales, wheezing or rhonchi  ABDOMEN: Soft, non-tender, non-distended EXTREMITIES:  No edema; No deformity   Wt Readings from Last 3 Encounters:  11/16/24 62.1 kg  11/06/24 61.2 kg  10/05/24 59 kg    Lab Results  Component Value Date   TSH 2.053 10/09/2024   EKG today demonstrates:   EKG Interpretation Date/Time:    Ventricular Rate:    PR Interval:    QRS Duration:    QT Interval:    QTC Calculation:   R Axis:      Text Interpretation:          Echo Completed 10/10/2024: Small spade like ventricular cavity. 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 Left  ventricular ejection fraction, by estimation,  is 60 to  65%. The left ventricle has normal function. The left ventricle  has no regional wall motion abnormalities. There is mild left ventricular  hypertrophy. Left ventricular diastolic parameters are consistent with  Grade II diastolic dysfunction  (pseudonormalization). Elevated left ventricular end-diastolic pressure.   2. Device leads in RA/RV. Right ventricular systolic function is normal.  The right ventricular size is normal.   3. Left atrial size was moderately dilated.   4. The mitral valve is abnormal. Mild mitral valve regurgitation. No  evidence of mitral stenosis.   5. Calcified non coronary cusp. The aortic valve is tricuspid. There is  moderate calcification of the aortic valve. There is moderate thickening  of the aortic valve. Aortic valve regurgitation is not visualized. Aortic  valve sclerosis is present, with  no evidence of aortic valve stenosis.   6. The inferior vena cava is normal in size with greater than 50%  respiratory variability, suggesting right atrial pressure of 3 mmHg   CHA2DS2-VASc Score = 5  The patient's score is based upon: CHF History: 0 HTN History: 1 Diabetes History: 0 Stroke History: 0 Vascular Disease History: 1 Age Score: 2 Gender Score: 1   {Confirm score is correct.  If not, click here to update score.  REFRESH note.  :1}    ASSESSMENT AND PLAN: Longstanding Persistent Atrial Fibrillation (ICD10:  I48.11) The patient's CHA2DS2-VASc score is 5, indicating a 7.2% annual risk of stroke.   - Diagnosed in 2019 and treated with Tikosyn  with QT prolongation and discontinuation with transition to amiodarone .  Recent device interrogation revealed persistent AF possibly secondary to UTI - Today patient is*** - Continue Eliquis  2.5 mg twice daily  History of CAD: -s/p LHC with BMS to LAD and last LHC completed 05/2022 showing nonobstructive CAD with 15% distal left main, 40% proximal left circumflex and 30% proximal to mid RCA and medical therapy  recommended  HTN: BP well controlled. Continue current antihypertensive regimen.   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 Inocencio  Secondary Hypercoagulable State (ICD10:  D68.69){Click to add to Prob List or Visit Dx  :[id number]} The patient is at significant risk  for stroke/thromboembolism based upon her CHA2DS2-VASc Score of 5.  {Anticoag or No Anticoag  :[id number]}    Signed,  Wyn Raddle, Jackee Shove, NP    11/23/2024 1:14 PM     {Are you ordering a CV Procedure (e.g. stress test, cath, DCCV, TEE, etc)?   Press F2        :[id number]}  Do not forget to refresh clinic note for EKG  Follow up with the AF Clinic in ***  {Click to Open Review  :1} Discussed the use of AI scribe software for clinical note transcription with the patient, who gave verbal consent to proceed.  History of Present Illness   "

## 2024-11-23 NOTE — Progress Notes (Signed)
 " To check-in Nursing staff: Please review Meds, Allergies, PMH, and care teams and update Please request wt, home BP, etc and update vitals if able Please complete the following flowsheets under the Medicare Visits Tab:  Medicare Wellness  Stress  PHQ-2-9  Exercise  Social Connections  Method of visit: Not In Person ----------------------------------------------------------------------------------------------------------------------------------------------------------------------------------------------------------------------  Because this visit was a virtual/telehealth visit, some criteria may be missing or patient reported. Any vitals not documented were not able to be obtained and vitals that have been documented are patient reported.    MEDICARE ANNUAL PREVENTIVE VISIT WITH PROVIDER: (Welcome to Medicare, initial annual wellness or annual wellness exam)  Virtual Visit via Phone Note  I connected with Barbara [name] on 11/23/24 by phone and verified that I am speaking with the correct person using two identifiers.  Location patient: home Location provider:work or home office Persons participating in the virtual visit: patient, provider  Concerns and/or follow up today: detailed intake and risks/health assessment completed in flowsheets and below - please see for details.   How often do you have a drink containing alcohol?n How many drinks containing alcohol do you have on a typical day when you are drinking?na How often do you have six or more drinks on one occasion?na Have you ever smoked?y Quit date if applicable? 1995  How many packs a day do/did you smoke? Light smoker Do you use smokeless tobacco?n Do you use an illicit drugs?na Do you feel safe at home? Last dentist visit?hasn't gone in some times - plans to go now that she is feeling better Last eye Exam and location?missed - plans to reschedule   See HM section in Epic for other details of completed HM.     ROS: negative for report of fevers, unintentional weight loss, vision changes, vision loss, hearing loss or change, chest pain, sob, hemoptysis, melena, hematochezia, hematuria or bleeding or bruising  Patient-completed extensive health risk assessment - reviewed and discussed with the patient: See Health Risk Assessment completed with patient prior to the visit either above or in recent phone note. This was reviewed in detailed with the patient today and appropriate recommendations, orders and referrals were placed as needed per Summary below and patient instructions.   Review of Medical History: -PMH, PSH, Family History and current specialty and care providers reviewed and updated and listed below   Patient Care Team: Johnny Garnette LABOR, MD as PCP - General (Family Medicine) Inocencio Soyla Lunger, MD as PCP - Electrophysiology (Cardiology) Kate Lonni CROME, MD as PCP - Cardiology (Cardiology) Micheal Wolm ORN, MD as Consulting Physician (Family Medicine)   Past Medical History:  Diagnosis Date   A-fib [facility])    Allergy    CAD (coronary artery disease)    sees Dr. Debby Sor  cardiac stents - 2000   CHF (congestive heart failure) [facility])    Colon polyps    Complication of anesthesia    rash/hives with caines   Dyspnea    02/12/18  when my heart gets out of rhythm, it has not been out of rhythym- since I have been on Tikosyn  (11/2017)   Dysrhythmia    afib fib   GERD (gastroesophageal reflux disease)    takes OTC- Omeprazole - prn   Heart murmur    History of kidney stones    History of stress test    show normal perfusion without scar or ischemia, post EF 68%   Hx of echocardiogram    show an EF 55%-60% range with grade  1 diastolic dysfunction, she had mitral anular calcification with mild MR, moderate LA dilation and mild pulmonary hypertension with a PA estimated pressure of 39mm   Hyperlipidemia    Hypertension    NSTEMI (non-ST elevated myocardial infarction) (HCC)     Osteoarthritis    Pacemaker    Pneumonia    hx of 2015    PONV (postoperative nausea and vomiting)    Sleep apnea     Past Surgical History:  Procedure Laterality Date   CARDIAC CATHETERIZATION     11/2017   cardiac stents  2000   COLONOSCOPY  01-05-14   per Dr. Norleen Hint, clear, no repeats needed    COLONOSCOPY     CORONARY STENT PLACEMENT  2000   in [facility]   CYSTOSCOPY WITH RETROGRADE PYELOGRAM, URETEROSCOPY AND STENT PLACEMENT Left 06/11/2022   Procedure: CYSTOSCOPY WITH RETROGRADE PYELOGRAM, LEFT STENT PLACEMENT;  Surgeon: Carolee Sherwood JONETTA DOUGLAS, MD;  Location: [facility];  Service: Urology;  Laterality: Left;   CYSTOSCOPY/URETEROSCOPY/HOLMIUM LASER/STENT PLACEMENT Left 06/25/2022   Procedure: CYSTOSCOPY LEFT URETEROSCOPY/HOLMIUM LASER/STENT PLACEMENT;  Surgeon: Watt Norleen, MD;  Location: [facility];  Service: Urology;  Laterality: Left;  1 HR FOR THIS CASE   DIRECT LARYNGOSCOPY WITH RADIAESSE INJECTION N/A 02/13/2018   Procedure: DIRECT LARYNGOSCOPY WITH RADIAESSE INJECTION;  Surgeon: Carlie Clark, MD;  Location: [facility] OR;  Service: ENT;  Laterality: N/A;   ESOPHAGOGASTRODUODENOSCOPY N/A 03/11/2024   Procedure: EGD (ESOPHAGOGASTRODUODENOSCOPY);  Surgeon: Rosalie Kitchens, MD;  Location: [facility] ENDOSCOPY;  Service: Gastroenterology;  Laterality: N/A;   EYE SURGERY Left    CATARACT REMOVAL   HIP ARTHROPLASTY Left 09/27/2023   Procedure: ARTHROPLASTY BIPOLAR HIP (HEMIARTHROPLASTY);  Surgeon: Beverley Evalene JONETTA, MD;  Location: [facility] OR;  Service: Orthopedics;  Laterality: Left;   KNEE ARTHROSCOPY Left 01/06/2015   Procedure: LEFT KNEE ARTHROSCOPY, abrasion chondroplasty of the medial femerol condryl,medial and lateral menisectomy, microfracture , synovectomy of the suprpatellar pouch;  Surgeon: Tanda Heading, MD;  Location: [facility];  Service: Orthopedics;  Laterality: Left;   LEFT HEART CATH AND CORONARY ANGIOGRAPHY N/A 02/26/2019   Procedure: LEFT HEART CATH AND CORONARY ANGIOGRAPHY;  Surgeon: Burnard Debby LABOR, MD;   Location: [facility];  Service: Cardiovascular;  Laterality: N/A;   LEFT HEART CATH AND CORONARY ANGIOGRAPHY N/A 05/06/2022   Procedure: LEFT HEART CATH AND CORONARY ANGIOGRAPHY;  Surgeon: Mady Bruckner, MD;  Location: [facility];  Service: Cardiovascular;  Laterality: N/A;   LEFT HEART CATH AND CORONARY ANGIOGRAPHY N/A 10/11/2024   Procedure: LEFT HEART CATH AND CORONARY ANGIOGRAPHY;  Surgeon: Verlin Bruckner JONETTA, MD;  Location: [facility];  Service: Cardiovascular;  Laterality: N/A;   MICROLARYNGOSCOPY W/VOCAL CORD INJECTION N/A 08/07/2018   Procedure: MICROLARYNGOSCOPY WITH VOCAL CORD INJECTION OF PROLARYN;  Surgeon: Carlie Clark, MD;  Location: [facility] OR;  Service: ENT;  Laterality: N/A;  JET VENTILATION   PACEMAKER IMPLANT N/A 05/08/2022   Procedure: PACEMAKER IMPLANT;  Surgeon: Inocencio Soyla Lunger, MD;  Location: [facility];  Service: Cardiovascular;  Laterality: N/A;   REVERSE SHOULDER ARTHROPLASTY Right 03/23/2020   Procedure: REVERSE SHOULDER ARTHROPLASTY;  Surgeon: Sharl Selinda Dover, MD;  Location: [facility] OR;  Service: Orthopedics;  Laterality: Right;   VAGINAL HYSTERECTOMY  1971    Social History   Socioeconomic History   Marital status: Widowed    Spouse name: Not on file   Number of children: 2   Years of education: college   Highest education level: High school graduate  Occupational History   Occupation: Retired  Tobacco Use   Smoking status: Former    Current packs/day: 0.00    Types: Cigarettes    Start date: 11/04/1956    Quit date: 11/04/1996    Years since quitting: 28.0   Smokeless tobacco: Never  Vaping Use   Vaping status: Never Used  Substance and Sexual Activity   Alcohol use: No    Alcohol/week: 0.0 standard drinks of alcohol   Drug use: No   Sexual activity: Not Currently  Other Topics Concern   Not on file  Social History Narrative   Not on file   Social Drivers of Health   Tobacco Use: Medium Risk (11/23/2024)    Patient History    Smoking Tobacco Use: Former    Smokeless Tobacco Use: Never    Passive Exposure: Not on Actuary Strain: Low Risk (08/05/2022)   Overall Financial Resource Strain (CARDIA)    Difficulty of Paying Living Expenses: Not hard at all  Food Insecurity: No Food Insecurity (10/09/2024)   Epic    Worried About Programme Researcher, Broadcasting/film/video in the Last Year: Never true    Ran Out of Food in the Last Year: Never true  Transportation Needs: Unmet Transportation Needs (11/23/2024)   Epic    Lack of Transportation (Medical): Yes    Lack of Transportation (Non-Medical): Yes  Physical Activity: Insufficiently Active (11/23/2024)   Exercise Vital Sign    Days of Exercise per Week: 2 days    Minutes of Exercise per Session: 50 min  Stress: No Stress Concern Present (11/23/2024)   [employer] of Occupational Health - Occupational Stress Questionnaire    Feeling of Stress: Not at all  Social Connections: Unknown (11/23/2024)   Social Connection and Isolation Panel    Frequency of Communication with Friends and Family: Not on file    Frequency of Social Gatherings with Friends and Family: Not on file    Attends Religious Services: Not on file    Active Member of Clubs or Organizations: Not on file    Attends Banker Meetings: Never    Marital Status: Widowed  Recent Concern: Social Connections - Moderately Isolated (11/23/2024)   Social Connection and Isolation Panel    Frequency of Communication with Friends and Family: More than three times a week    Frequency of Social Gatherings with Friends and Family: Twice a week    Attends Religious Services: More than 4 times per year    Active Member of [employer] or Organizations: No    Attends Banker Meetings: Never    Marital Status: Widowed  Intimate Partner Violence: Not At Risk (10/09/2024)   Epic    Fear of Current or Ex-Partner: No    Emotionally Abused: No    Physically Abused: No    Sexually  Abused: No  Depression (PHQ2-9): Low Risk (11/23/2024)   Depression (PHQ2-9)    PHQ-2 Score: 0  Alcohol Screen: Low Risk (08/05/2022)   Alcohol Screen    Last Alcohol Screening Score (AUDIT): 0  Housing: High Risk (06/20/2024)   Epic    Unable to Pay for Housing in the Last Year: No    Number of Times Moved in the Last Year: 3    Homeless in the Last Year: No  Utilities: Not At Risk (10/09/2024)   Epic    Threatened with loss of utilities: No  Health Literacy: Adequate Health Literacy (11/23/2024)   B1300 Health Literacy  Frequency of need for help with medical instructions: Never    Family History  Problem Relation Age of Onset   Cancer Maternal Grandmother    Heart disease Maternal Grandfather     Medications Ordered Prior to Encounter[1]  Allergies[2]     Physical Exam Vitals requested from patient and listed below if patient had equipment and was able to obtain at home for this virtual visit: There were no vitals filed for this visit. Estimated body mass index is 20.83 kg/m as calculated from the following:   Height as of 10/09/24: 5' 8 (1.727 m).   Weight as of 11/16/24: 137 lb (62.1 kg).  EKG (optional): deferred due to virtual visit  GENERAL: alert, oriented, no acute distress detected, full vision exam deferred due to pandemic and/or virtual encounter  PSYCH/NEURO: pleasant and cooperative, no obvious depression or anxiety, speech and thought processing grossly intact, Cognitive function grossly intact  Flowsheet Row Clinical Support from 11/23/2024 in [facility]  PHQ-9 Total Score 0        11/23/2024   12:42 PM 04/05/2024    9:44 AM 02/18/2024    3:46 PM 08/26/2023   11:42 AM 05/13/2023    4:26 PM  Depression screen PHQ 2/9  Decreased Interest 0 0 0 0 0  Down, Depressed, Hopeless 0 0 0 0 0  PHQ - 2 Score 0 0 0 0 0  Altered sleeping 0    0  Tired, decreased energy 0    0  Change in appetite 0    0  Feeling bad or failure  about yourself  0    0  Trouble concentrating 0    0  Moving slowly or fidgety/restless 0    0  Suicidal thoughts 0    0  PHQ-9 Score 0    0   Difficult doing work/chores     Not difficult at all     Data saved with a previous flowsheet row definition       11/19/2023    2:05 PM 10/05/2024    4:44 PM 11/16/2024    1:20 PM 11/23/2024   12:36 PM 11/23/2024   12:53 PM  Fall Risk  Falls in the past year? 0 1 0 0   Was there an injury with Fall? 0  0 0 0   Fall Risk Category Calculator 0 2 1 0   Patient at Risk for Falls Due to History of fall(s) History of fall(s) History of fall(s)  No Fall Risks  Fall risk Follow up Falls evaluation completed Falls evaluation completed Falls evaluation completed Falls evaluation completed Falls evaluation completed     Data saved with a previous flowsheet row definition     SUMMARY AND PLAN:  Medicare annual wellness visit, subsequent   Discussed applicable health maintenance/preventive health measures and advised and referred or ordered per patient preferences: - declined vaccines  Health Maintenance  Topic Date Due   COVID-19 Vaccine (4 - 2025-26 season) 12/09/2024 (Originally 07/05/2024)   Zoster Vaccines- Shingrix (1 of 2) 02/21/2025 (Originally 03/16/1960)   Medicare Annual Wellness (AWV)  11/23/2025   DTaP/Tdap/Td (2 - Td or Tdap) 11/07/2027   Pneumococcal Vaccine: 50+ Years  Completed   Influenza Vaccine  Completed   Bone Density Scan  Completed   Meningococcal B Vaccine  Aged Out      Education and counseling on the following was provided based on the above review of health and a plan/checklist for the patient,  along with additional information discussed, was provided for the patient in the patient instructions :  -Advised and counseled on a healthy lifestyle - including the importance of a healthy diet, regular physical activity, social connections and stress management. -Reviewed patient's current diet. Advised and counseled on a  whole foods based healthy diet. A summary of a healthy diet was provided in the Patient Instructions.  -reviewed patient's current physical activity level and discussed exercise guidelines for adults. Discussed community resources and ideas for safe exercise at home to assist in meeting exercise guideline recommendations in a safe and healthy way.  -Advise yearly dental visits at minimum and regular eye exams  Follow up: see patient instructions     Patient Instructions  I really enjoyed getting to talk with you today! I am available on Tuesdays and Thursdays for virtual visits if you have any questions or concerns, or if I can be of any further assistance.   CHECKLIST FROM ANNUAL WELLNESS VISIT:  -Follow up (please call to schedule if not scheduled after visit):   -yearly for annual wellness visit with primary care office  Here is a list of your preventive care/health maintenance measures and the plan for each if any are due:  PLAN For any measures below that may be due:    1. Can get vaccines at the pharmacy if you wish. Please let us  know if you do so that we can update your record.   Health Maintenance  Topic Date Due   COVID-19 Vaccine (4 - 2025-26 season) 12/09/2024 (Originally 07/05/2024)   Zoster Vaccines- Shingrix (1 of 2) 02/21/2025 (Originally 03/16/1960)   Medicare Annual Wellness (AWV)  11/23/2025   DTaP/Tdap/Td (2 - Td or Tdap) 11/07/2027   Pneumococcal Vaccine: 50+ Years  Completed   Influenza Vaccine  Completed   Bone Density Scan  Completed   Meningococcal B Vaccine  Aged Out    -See a dentist at least yearly  -Get your eyes checked and then per your eye specialist's recommendations  -Other issues addressed today:   -I have included below further information regarding a healthy whole foods based diet, physical activity guidelines for adults, stress management and opportunities for social connections. I hope you find this information useful.    -----------------------------------------------------------------------------------------------------------------------------------------------------------------------------------------------------------------------------------------------------------    NUTRITION: -eat real food: lots of colorful vegetables (half the plate) and fruits -5-7 servings of vegetables and fruits per day (fresh or steamed is best), exp. 2 servings of vegetables with lunch and dinner and 2 servings of fruit per day. Berries and greens such as kale and collards are great choices.  -consume on a regular basis:  fresh fruits, fresh veggies, fish, nuts, seeds, healthy oils (such as olive oil, avocado oil), whole grains (make sure for bread/pasta/crackers/etc., that the first ingredient on label contains the word whole), legumes. -can eat small amounts of dairy and lean meat (no larger than the palm of your hand), but avoid processed meats such as ham, bacon, lunch meat, etc. -drink water  -try to avoid fast food and pre-packaged foods, processed meat, ultra processed foods/beverages (donuts, candy, etc.) -most experts advise limiting sodium to < 2300mg  per day, should limit further is any chronic conditions such as high blood pressure, heart disease, diabetes, etc. The American Heart Association advised that < 1500mg  is is ideal -try to avoid foods/beverages that contain any ingredients with names you do not recognize  -try to avoid foods/beverages  with added sugar or sweeteners/sweets  -try to avoid sweet drinks (including diet drinks):  soda, juice, Gatorade, sweet tea, power drinks, diet drinks -try to avoid white rice, white bread, pasta (unless whole grain)  EXERCISE GUIDELINES FOR ADULTS: -if you wish to increase your physical activity, do so gradually and with the approval of your doctor -STOP and seek medical care immediately if you have any chest pain, chest discomfort or trouble breathing when starting or  increasing exercise  -move and stretch your body, legs, feet and arms when sitting for long periods -Physical activity guidelines for optimal health in adults: -get at least 150 minutes per week of moderate exercise (can talk, but not sing); this is about 20-30 minutes of sustained activity 5-7 days per week or two 10-15 minute episodes of sustained activity 5-7 days per week -do some muscle building/resistance training/strength training at least 2 days per week  -balance exercises 3+ days per week:   Stand somewhere where you have something sturdy to hold onto if you lose balance    1) lift up on toes, then back down, start with 5x per day and work up to 20x   2) stand and lift one leg straight out to the side so that foot is a few inches of the floor, start with 5x each side and work up to 20x each side   3) stand on one foot, start with 5 seconds each side and work up to 20 seconds on each side  If you need ideas or help with getting more active:  -Silver sneakers https://tools.silversneakers.com  -Walk with a Doc: Http://www.duncan-williams.com/  -try to include resistance (weight lifting/strength building) and balance exercises twice per week: or the following link for ideas: http://castillo-powell.com/  buyducts.dk  STRESS MANAGEMENT: -can try meditating, or just sitting quietly with deep breathing while intentionally relaxing all parts of your body for 5 minutes daily -if you need further help with stress, anxiety or depression please follow up with your primary doctor or contact the wonderful folks at [facility]: [phone]  SOCIAL CONNECTIONS: -options in [place] if you wish to engage in more social and exercise related activities:  -Silver sneakers https://tools.silversneakers.com  -Walk with a Doc: Http://www.duncan-williams.com/  -Check out the [facility] Active Adults 50+  section on the [place] of [employer] (hiking clubs, book clubs, cards and games, chess, exercise classes, aquatic classes and much more) - see the website for details: https://www.[place].gov/departments/parks-recreation/active-adults50  -YouTube has lots of exercise videos for different ages and abilities as well  -[facility] (a variety of indoor and outdoor inperson activities for adults). [phone]. [address].  -Virtual Online Classes (a variety of topics): see seniorplanet.org or call [phone]  -consider volunteering at a school, hospice center, church, senior center or elsewhere            Chiquita JONELLE Cramp, DO      [1]  Current Outpatient Medications on File Prior to Visit  Medication Sig Dispense Refill   acetaminophen  (TYLENOL ) 325 MG tablet Take 2 tablets (650 mg total) by mouth every 6 (six) hours as needed for mild pain (pain score 1-3) or fever (or Fever >/= 101).     alum & mag hydroxide-simeth (MAALOX/MYLANTA) 200-200-20 MG/5ML suspension Take 30 mLs by mouth every 4 (four) hours as needed for indigestion. 355 mL 0   amiodarone  (PACERONE ) 200 MG tablet Take 1 tablet (200 mg total) by mouth daily. 90 tablet 3   apixaban  (ELIQUIS ) 2.5 MG TABS tablet Take 1 tablet (2.5 mg total) by mouth 2 (two) times daily. 180 tablet 3  cephALEXin  (KEFLEX ) 500 MG capsule Take 1 capsule (500 mg total) by mouth 3 (three) times daily. 21 capsule 0   cyanocobalamin  (VITAMIN B12) 500 MCG tablet Take 500 mcg by mouth daily.     empagliflozin  (JARDIANCE ) 10 MG TABS tablet Take 1 tablet (10 mg total) by mouth daily. 90 tablet 3   furosemide  (LASIX ) 40 MG tablet Take 1 tablet (40 mg total) by mouth 2 (two) times a week. Please monitor your weights daily and take an additional dose of furosemide  if your weight goes up by 3 pounds. 60 tablet 0   ipratropium (ATROVENT ) 0.06 % nasal spray Place 2 sprays into both nostrils 2 (two) times daily as needed  (allergies).  5   isosorbide  mononitrate (IMDUR ) 30 MG 24 hr tablet Take 1 tablet (30 mg total) by mouth daily. 90 tablet 3   ketoconazole  (NIZORAL ) 2 % cream Apply 1 Application topically 2 (two) times daily as needed for irritation. 30 g 5   loratadine  (CLARITIN ) 10 MG tablet Take 10 mg by mouth daily as needed for allergies.     metoprolol  tartrate (LOPRESSOR ) 25 MG tablet Take 1 tablet (25 mg total) by mouth 2 (two) times daily. 60 tablet 11   mirtazapine  (REMERON ) 15 MG tablet Take 1 tablet (15 mg total) by mouth at bedtime. 30 tablet 5   nitroGLYCERIN  (NITROSTAT ) 0.4 MG SL tablet Place 1 tablet (0.4 mg total) under the tongue every 5 (five) minutes as needed for chest pain. 60 tablet 5   Nutritional Supplements (BOOST/FIBER PO) Take 1 Bottle by mouth 3 (three) times daily.     pantoprazole  (PROTONIX ) 40 MG tablet Take 1 tablet (40 mg total) by mouth daily. 90 tablet 3   potassium chloride  (KLOR-CON  10) 10 MEQ tablet Take 1 tablet (10 mEq total) by mouth in the morning and at bedtime. 180 tablet 3   rosuvastatin  (CRESTOR ) 10 MG tablet Take 1 tablet (10 mg total) by mouth daily.     senna-docusate (SENOKOT-S) 8.6-50 MG tablet Take 1 tablet by mouth 2 (two) times daily. (Patient taking differently: Take 1 tablet by mouth daily.)     triamcinolone  (NASACORT) 55 MCG/ACT nasal inhaler Place 1 spray into both nostrils daily as needed (for allergies).      No current facility-administered medications on file prior to visit.  [2]  Allergies Allergen Reactions   Lidocaine  Anaphylaxis   Morphine  Other (See Comments)    Body shuts down   Procaine Hcl Anaphylaxis, Rash and Other (See Comments)    Anything with 'caine' in it    Sulfonamide Derivatives Hives   Amlodipine  Swelling   Ezetimibe-Simvastatin Other (See Comments)    VYTORIN = Joint pain   Latex Itching and Other (See Comments)    WELTS   Norco [Hydrocodone -Acetaminophen ] Nausea And Vomiting and Other (See Comments)    States does  ok with IV form    Tape Itching and Other (See Comments)    Patient prefers either paper tape or Coban wrap   Tramadol  Nausea And Vomiting   "

## 2024-11-23 NOTE — Patient Instructions (Signed)
 I really enjoyed getting to talk with you today! I am available on Tuesdays and Thursdays for virtual visits if you have any questions or concerns, or if I can be of any further assistance.   CHECKLIST FROM ANNUAL WELLNESS VISIT:  -Follow up (please call to schedule if not scheduled after visit):   -yearly for annual wellness visit with primary care office  Here is a list of your preventive care/health maintenance measures and the plan for each if any are due:  PLAN For any measures below that may be due:    1. Can get vaccines at the pharmacy if you wish. Please let us  know if you do so that we can update your record.   Health Maintenance  Topic Date Due   COVID-19 Vaccine (4 - 2025-26 season) 12/09/2024 (Originally 07/05/2024)   Zoster Vaccines- Shingrix (1 of 2) 02/21/2025 (Originally 03/16/1960)   Medicare Annual Wellness (AWV)  11/23/2025   DTaP/Tdap/Td (2 - Td or Tdap) 11/07/2027   Pneumococcal Vaccine: 50+ Years  Completed   Influenza Vaccine  Completed   Bone Density Scan  Completed   Meningococcal B Vaccine  Aged Out    -See a dentist at least yearly  -Get your eyes checked and then per your eye specialist's recommendations  -Other issues addressed today:   -I have included below further information regarding a healthy whole foods based diet, physical activity guidelines for adults, stress management and opportunities for social connections. I hope you find this information useful.   -----------------------------------------------------------------------------------------------------------------------------------------------------------------------------------------------------------------------------------------------------------    NUTRITION: -eat real food: lots of colorful vegetables (half the plate) and fruits -5-7 servings of vegetables and fruits per day (fresh or steamed is best), exp. 2 servings of vegetables with lunch and dinner and 2 servings of fruit per  day. Berries and greens such as kale and collards are great choices.  -consume on a regular basis:  fresh fruits, fresh veggies, fish, nuts, seeds, healthy oils (such as olive oil, avocado oil), whole grains (make sure for bread/pasta/crackers/etc., that the first ingredient on label contains the word whole), legumes. -can eat small amounts of dairy and lean meat (no larger than the palm of your hand), but avoid processed meats such as ham, bacon, lunch meat, etc. -drink water  -try to avoid fast food and pre-packaged foods, processed meat, ultra processed foods/beverages (donuts, candy, etc.) -most experts advise limiting sodium to < 2300mg  per day, should limit further is any chronic conditions such as high blood pressure, heart disease, diabetes, etc. The American Heart Association advised that < 1500mg  is is ideal -try to avoid foods/beverages that contain any ingredients with names you do not recognize  -try to avoid foods/beverages  with added sugar or sweeteners/sweets  -try to avoid sweet drinks (including diet drinks): soda, juice, Gatorade, sweet tea, power drinks, diet drinks -try to avoid white rice, white bread, pasta (unless whole grain)  EXERCISE GUIDELINES FOR ADULTS: -if you wish to increase your physical activity, do so gradually and with the approval of your doctor -STOP and seek medical care immediately if you have any chest pain, chest discomfort or trouble breathing when starting or increasing exercise  -move and stretch your body, legs, feet and arms when sitting for long periods -Physical activity guidelines for optimal health in adults: -get at least 150 minutes per week of moderate exercise (can talk, but not sing); this is about 20-30 minutes of sustained activity 5-7 days per week or two 10-15 minute episodes of sustained activity 5-7 days per  week -do some muscle building/resistance training/strength training at least 2 days per week  -balance exercises 3+ days per  week:   Stand somewhere where you have something sturdy to hold onto if you lose balance    1) lift up on toes, then back down, start with 5x per day and work up to 20x   2) stand and lift one leg straight out to the side so that foot is a few inches of the floor, start with 5x each side and work up to 20x each side   3) stand on one foot, start with 5 seconds each side and work up to 20 seconds on each side  If you need ideas or help with getting more active:  -Silver sneakers https://tools.silversneakers.com  -Walk with a Doc: Http://www.duncan-williams.com/  -try to include resistance (weight lifting/strength building) and balance exercises twice per week: or the following link for ideas: http://castillo-powell.com/  buyducts.dk  STRESS MANAGEMENT: -can try meditating, or just sitting quietly with deep breathing while intentionally relaxing all parts of your body for 5 minutes daily -if you need further help with stress, anxiety or depression please follow up with your primary doctor or contact the wonderful folks at [facility]: [phone]  SOCIAL CONNECTIONS: -options in [place] if you wish to engage in more social and exercise related activities:  -Silver sneakers https://tools.silversneakers.com  -Walk with a Doc: Http://www.duncan-williams.com/  -Check out the [facility] Active Adults 50+ section on the [place] of [employer] companies (hiking clubs, book clubs, cards and games, chess, exercise classes, aquatic classes and much more) - see the website for details: https://www.[place].gov/departments/parks-recreation/active-adults50  -YouTube has lots of exercise videos for different ages and abilities as well  -[facility] (a variety of indoor and outdoor inperson activities for adults). [phone]. [address].  -Virtual Online Classes (a variety of  topics): see seniorplanet.org or call [phone]  -consider volunteering at a school, hospice center, church, senior center or elsewhere

## 2024-11-24 ENCOUNTER — Ambulatory Visit (HOSPITAL_COMMUNITY): Admitting: Nurse Practitioner

## 2024-11-24 ENCOUNTER — Encounter (HOSPITAL_COMMUNITY): Payer: Self-pay

## 2024-12-07 ENCOUNTER — Telehealth: Payer: Self-pay

## 2024-12-07 NOTE — Telephone Encounter (Signed)
 Copied from CRM #[id number]. Topic: Clinical - Home Health Verbal Orders >> Dec 07, 2024 10:22 AM Revonda D wrote: Caller/Agency: Despina with [facility] Callback Number: [phone],dzrlmz line  Service Requested: Social Worker  Frequency: N/A Any new concerns about the patient? No

## 2024-12-08 ENCOUNTER — Ambulatory Visit: Payer: Self-pay

## 2024-12-08 NOTE — Telephone Encounter (Signed)
 FYI Only or Action Required?: Action required by provider: request for appointment, clinical question for provider, and update on patient condition.  Patient was last seen in primary care on 11/23/2024 by Luke Chiquita SAUNDERS, DO.  Called Nurse Triage reporting Hip Pain.  Symptoms began yesterday.  Interventions attempted: Rest, hydration, or home remedies.  Symptoms are: gradually worsening.  Triage Disposition: See HCP Within 4 Hours (Or PCP Triage)  Patient/caregiver understands and will follow disposition?: No, wishes to speak with PCP    Message from Antwanette L sent at 12/08/2024  1:46 PM EST  Reason for Triage: Serr, a physical therapist with [facility], reports that the patient is experiencing pain in the left iliac( pelvic bone) rated 8-9 out of 10. The patient is still able to move around.   Reason for Disposition  [1] MODERATE pain (e.g., interferes with normal activities, limping) AND [2] present > 3 days  Answer Assessment - Initial Assessment Questions Pain started last night Pain is located at the top of her left iliac area--worse on palpation Pain is non-radiating No known injuries No pain on the anterior aspect of her body Pain when she is about to sit down or get up  No bowel or bladder issues, chest pain, difficulty breathing  She used Blue Emu on the area  9 out of 10 initially was advised but patient states it isnt that severe She and the physical therapist state it is maybe around 7 out of 10 because she can still walk around on it  Patient states she cannot come in today for an appointment due to transportation She asked about orthopedic such as [facility] Physical Therapist states that they may call [facility] and also call the patient's son to see about transportation They are advised that with severe pain it's recommended to be seen today and with moderate pain it is recommended to be seen within the next few days.  Patient is advised to  call us  back if anything changes or with any further questions/concerns. Patient is advised that if anything worsens to go to the Emergency Room or call 911. Patient verbalized understanding.  Protocols used: Hip Pain-A-AH

## 2024-12-08 NOTE — Telephone Encounter (Signed)
 FYI

## 2024-12-09 NOTE — Telephone Encounter (Signed)
 Left pt a detailed message regarding this encounter, advised to call the office back

## 2024-12-09 NOTE — Telephone Encounter (Signed)
 This does not sound like an emergency, but we can see her in the office if needed

## 2024-12-10 NOTE — Telephone Encounter (Signed)
 Spoke with pt stated that she has appointment with [facility] on 12/14/24

## 2025-02-15 ENCOUNTER — Other Ambulatory Visit

## 2025-02-22 ENCOUNTER — Ambulatory Visit: Admitting: Podiatry
